# Patient Record
Sex: Male | Born: 1960
Health system: Southern US, Community
[De-identification: ages and names within clinical notes are randomized; demographics above are authoritative.]

## PROBLEM LIST (undated history)

## (undated) DIAGNOSIS — I10 Essential (primary) hypertension: Secondary | ICD-10-CM

## (undated) DIAGNOSIS — Z95810 Presence of automatic (implantable) cardiac defibrillator: Secondary | ICD-10-CM

## (undated) DIAGNOSIS — I429 Cardiomyopathy, unspecified: Secondary | ICD-10-CM

## (undated) DIAGNOSIS — F209 Schizophrenia, unspecified: Secondary | ICD-10-CM

## (undated) DIAGNOSIS — Z950 Presence of cardiac pacemaker: Secondary | ICD-10-CM

## (undated) DIAGNOSIS — I2510 Atherosclerotic heart disease of native coronary artery without angina pectoris: Secondary | ICD-10-CM

## (undated) DIAGNOSIS — G4730 Sleep apnea, unspecified: Secondary | ICD-10-CM

## (undated) DIAGNOSIS — F329 Major depressive disorder, single episode, unspecified: Secondary | ICD-10-CM

## (undated) DIAGNOSIS — K922 Gastrointestinal hemorrhage, unspecified: Secondary | ICD-10-CM

## (undated) DIAGNOSIS — F419 Anxiety disorder, unspecified: Secondary | ICD-10-CM

## (undated) DIAGNOSIS — I4891 Unspecified atrial fibrillation: Secondary | ICD-10-CM

## (undated) DIAGNOSIS — F32A Depression, unspecified: Secondary | ICD-10-CM

## (undated) HISTORY — DX: Schizophrenia, unspecified: F20.9

## (undated) HISTORY — PX: CORONARY STENT PLACEMENT: SHX1402

## (undated) HISTORY — DX: Anxiety disorder, unspecified: F41.9

## (undated) HISTORY — DX: Depression, unspecified: F32.A

## (undated) HISTORY — PX: HERNIA REPAIR: SHX51

## (undated) HISTORY — DX: Major depressive disorder, single episode, unspecified: F32.9

---

## 1999-12-05 ENCOUNTER — Emergency Department (HOSPITAL_COMMUNITY): Admission: EM | Admit: 1999-12-05 | Discharge: 1999-12-05 | Payer: Self-pay | Admitting: Emergency Medicine

## 1999-12-05 ENCOUNTER — Encounter: Payer: Self-pay | Admitting: Emergency Medicine

## 2007-04-17 ENCOUNTER — Encounter: Payer: Self-pay | Admitting: *Deleted

## 2010-06-10 ENCOUNTER — Emergency Department (HOSPITAL_COMMUNITY): Admission: EM | Admit: 2010-06-10 | Discharge: 2010-06-10 | Payer: Self-pay | Admitting: Emergency Medicine

## 2011-02-11 LAB — CBC
HCT: 43.1 % (ref 39.0–52.0)
Hemoglobin: 14.2 g/dL (ref 13.0–17.0)
MCH: 30.1 pg (ref 26.0–34.0)
MCHC: 33 g/dL (ref 30.0–36.0)
MCV: 91.2 fL (ref 78.0–100.0)
Platelets: 154 10*3/uL (ref 150–400)
RBC: 4.72 MIL/uL (ref 4.22–5.81)
RDW: 13.5 % (ref 11.5–15.5)
WBC: 5.7 10*3/uL (ref 4.0–10.5)

## 2011-02-11 LAB — URINALYSIS, ROUTINE W REFLEX MICROSCOPIC
Glucose, UA: NEGATIVE mg/dL
Hgb urine dipstick: NEGATIVE
Ketones, ur: 15 mg/dL — AB
Nitrite: NEGATIVE
Protein, ur: NEGATIVE mg/dL
Specific Gravity, Urine: 1.039 — ABNORMAL HIGH (ref 1.005–1.030)
Urobilinogen, UA: 1 mg/dL (ref 0.0–1.0)
pH: 5.5 (ref 5.0–8.0)

## 2011-02-11 LAB — BASIC METABOLIC PANEL
BUN: 13 mg/dL (ref 6–23)
CO2: 32 mEq/L (ref 19–32)
Calcium: 8.8 mg/dL (ref 8.4–10.5)
Chloride: 103 mEq/L (ref 96–112)
Creatinine, Ser: 0.88 mg/dL (ref 0.4–1.5)
GFR calc Af Amer: >60 mL/min (ref >60)
GFR calc non Af Amer: >60 mL/min (ref >60)
Glucose, Bld: 82 mg/dL (ref 70–99)
Potassium: 4.2 mEq/L (ref 3.5–5.1)
Sodium: 141 mEq/L (ref 135–145)

## 2011-02-11 LAB — GLUCOSE, CAPILLARY: Glucose-Capillary: 127 mg/dL — ABNORMAL HIGH (ref 70–99)

## 2011-02-11 LAB — DIFFERENTIAL
Basophils Absolute: 0 10*3/uL (ref 0.0–0.1)
Basophils Relative: 0 % (ref 0–1)
Eosinophils Absolute: 0.2 10*3/uL (ref 0.0–0.7)
Eosinophils Relative: 3 % (ref 0–5)
Lymphocytes Relative: 36 % (ref 12–46)
Lymphs Abs: 2 10*3/uL (ref 0.7–4.0)
Monocytes Absolute: 0.6 10*3/uL (ref 0.1–1.0)
Monocytes Relative: 10 % (ref 3–12)
Neutro Abs: 2.9 10*3/uL (ref 1.7–7.7)
Neutrophils Relative %: 50 % (ref 43–77)

## 2011-09-23 ENCOUNTER — Inpatient Hospital Stay (HOSPITAL_COMMUNITY)
Admission: EM | Admit: 2011-09-23 | Discharge: 2011-09-27 | DRG: 247 | Disposition: A | Discharge status: Home Health | Payer: Medicare Other | Attending: Cardiovascular Disease | Admitting: Cardiovascular Disease

## 2011-09-23 ENCOUNTER — Emergency Department (HOSPITAL_COMMUNITY): Payer: Medicare Other

## 2011-09-23 DIAGNOSIS — I214 Non-ST elevation (NSTEMI) myocardial infarction: Principal | ICD-10-CM | POA: Diagnosis present

## 2011-09-23 DIAGNOSIS — E119 Type 2 diabetes mellitus without complications: Secondary | ICD-10-CM | POA: Diagnosis present

## 2011-09-23 DIAGNOSIS — I2510 Atherosclerotic heart disease of native coronary artery without angina pectoris: Secondary | ICD-10-CM | POA: Diagnosis present

## 2011-09-23 DIAGNOSIS — F17200 Nicotine dependence, unspecified, uncomplicated: Secondary | ICD-10-CM | POA: Diagnosis present

## 2011-09-23 DIAGNOSIS — Z23 Encounter for immunization: Secondary | ICD-10-CM

## 2011-09-23 DIAGNOSIS — F319 Bipolar disorder, unspecified: Secondary | ICD-10-CM | POA: Diagnosis present

## 2011-09-23 DIAGNOSIS — F209 Schizophrenia, unspecified: Secondary | ICD-10-CM | POA: Diagnosis present

## 2011-09-23 DIAGNOSIS — Z79899 Other long term (current) drug therapy: Secondary | ICD-10-CM

## 2011-09-23 DIAGNOSIS — I10 Essential (primary) hypertension: Secondary | ICD-10-CM | POA: Diagnosis present

## 2011-09-23 LAB — COMPREHENSIVE METABOLIC PANEL
ALT: 74 U/L — ABNORMAL HIGH (ref 0–53)
AST: 48 U/L — ABNORMAL HIGH (ref 0–37)
Alkaline Phosphatase: 87 U/L (ref 39–117)
CO2: 31 mEq/L (ref 19–32)
Chloride: 96 mEq/L (ref 96–112)
GFR calc Af Amer: >90 mL/min (ref >90)
GFR calc non Af Amer: >90 mL/min (ref >90)
Glucose, Bld: 437 mg/dL — ABNORMAL HIGH (ref 70–99)
Potassium: 4.1 mEq/L (ref 3.5–5.1)
Sodium: 134 mEq/L — ABNORMAL LOW (ref 135–145)
Total Bilirubin: 0.3 mg/dL (ref 0.3–1.2)

## 2011-09-23 LAB — CBC
HCT: 39.8 % (ref 39.0–52.0)
MCH: 29 pg (ref 26.0–34.0)
MCV: 86.1 fL (ref 78.0–100.0)
Platelets: 191 10*3/uL (ref 150–400)
RBC: 4.62 MIL/uL (ref 4.22–5.81)
WBC: 7 10*3/uL (ref 4.0–10.5)

## 2011-09-23 LAB — GLUCOSE, CAPILLARY: Glucose-Capillary: 248 mg/dL — ABNORMAL HIGH (ref 70–99)

## 2011-09-23 LAB — POCT I-STAT TROPONIN I

## 2011-09-23 LAB — CK TOTAL AND CKMB (NOT AT ARMC)
CK, MB: 5.8 ng/mL — ABNORMAL HIGH (ref 0.3–4.0)
Total CK: 336 U/L — ABNORMAL HIGH (ref 7–232)

## 2011-09-23 LAB — APTT: aPTT: 29 seconds (ref 24–37)

## 2011-09-23 LAB — TYPE AND SCREEN

## 2011-09-23 LAB — ABO/RH: ABO/RH(D): B POS

## 2011-09-24 LAB — LIPID PANEL
HDL: 26 mg/dL — ABNORMAL LOW (ref >39)
LDL Cholesterol: 73 mg/dL (ref 0–99)
Total CHOL/HDL Ratio: 5.2 RATIO
Triglycerides: 174 mg/dL — ABNORMAL HIGH (ref <150)

## 2011-09-24 LAB — GLUCOSE, CAPILLARY
Glucose-Capillary: 229 mg/dL — ABNORMAL HIGH (ref 70–99)
Glucose-Capillary: 134 mg/dL — ABNORMAL HIGH (ref 70–99)
Glucose-Capillary: 186 mg/dL — ABNORMAL HIGH (ref 70–99)

## 2011-09-24 LAB — CARDIAC PANEL(CRET KIN+CKTOT+MB+TROPI)
CK, MB: 4.5 ng/mL — ABNORMAL HIGH (ref 0.3–4.0)
Relative Index: 1.9 (ref 0.0–2.5)
Relative Index: 1.8 (ref 0.0–2.5)
Total CK: 247 U/L — ABNORMAL HIGH (ref 7–232)

## 2011-09-24 LAB — HEPARIN LEVEL (UNFRACTIONATED)
Heparin Unfractionated: 0.1 IU/mL — ABNORMAL LOW (ref 0.30–0.70)
Heparin Unfractionated: 0.24 IU/mL — ABNORMAL LOW (ref 0.30–0.70)
Heparin Unfractionated: 0.34 IU/mL (ref 0.30–0.70)

## 2011-09-25 LAB — CBC
HCT: 40.2 % (ref 39.0–52.0)
HCT: 35.7 % — ABNORMAL LOW (ref 39.0–52.0)
Hemoglobin: 13.6 g/dL (ref 13.0–17.0)
Hemoglobin: 12 g/dL — ABNORMAL LOW (ref 13.0–17.0)
MCHC: 33.8 g/dL (ref 30.0–36.0)
MCV: 85.9 fL (ref 78.0–100.0)
MCV: 86.2 fL (ref 78.0–100.0)
WBC: 7.6 10*3/uL (ref 4.0–10.5)
WBC: 11.2 10*3/uL — ABNORMAL HIGH (ref 4.0–10.5)

## 2011-09-25 LAB — BASIC METABOLIC PANEL
BUN: 12 mg/dL (ref 6–23)
CO2: 29 mEq/L (ref 19–32)
Glucose, Bld: 145 mg/dL — ABNORMAL HIGH (ref 70–99)
Potassium: 3.7 mEq/L (ref 3.5–5.1)
Sodium: 138 mEq/L (ref 135–145)

## 2011-09-25 LAB — GLUCOSE, CAPILLARY
Glucose-Capillary: 113 mg/dL — ABNORMAL HIGH (ref 70–99)
Glucose-Capillary: 205 mg/dL — ABNORMAL HIGH (ref 70–99)

## 2011-09-26 LAB — BASIC METABOLIC PANEL
BUN: 15 mg/dL (ref 6–23)
CO2: 29 mEq/L (ref 19–32)
Chloride: 101 mEq/L (ref 96–112)
GFR calc Af Amer: >90 mL/min (ref >90)
Glucose, Bld: 227 mg/dL — ABNORMAL HIGH (ref 70–99)
Potassium: 3.8 mEq/L (ref 3.5–5.1)

## 2011-09-26 LAB — CBC
HCT: 32 % — ABNORMAL LOW (ref 39.0–52.0)
Hemoglobin: 10.5 g/dL — ABNORMAL LOW (ref 13.0–17.0)
MCV: 87 fL (ref 78.0–100.0)
RBC: 3.68 MIL/uL — ABNORMAL LOW (ref 4.22–5.81)
WBC: 9.1 10*3/uL (ref 4.0–10.5)

## 2011-09-26 LAB — GLUCOSE, CAPILLARY: Glucose-Capillary: 204 mg/dL — ABNORMAL HIGH (ref 70–99)

## 2011-09-27 LAB — GLUCOSE, CAPILLARY: Glucose-Capillary: 154 mg/dL — ABNORMAL HIGH (ref 70–99)

## 2011-09-27 LAB — CBC
HCT: 28.9 % — ABNORMAL LOW (ref 39.0–52.0)
RBC: 3.29 MIL/uL — ABNORMAL LOW (ref 4.22–5.81)
RDW: 12.9 % (ref 11.5–15.5)
WBC: 6.4 10*3/uL (ref 4.0–10.5)

## 2011-09-28 NOTE — Cardiovascular Report (Signed)
  NAMEOLLIVER, BOYADJIAN          ACCOUNT NO.:  [id number]  MEDICAL RECORD NO.:  [id number]  LOCATION:  2504                         FACILITY:  [facility]  PHYSICIAN:  Eduardo Osier. Sharyn Lull, M.D. DATE OF BIRTH:  [birth year]  DATE OF PROCEDURE:  09/25/2011 DATE OF DISCHARGE:                           CARDIAC CATHETERIZATION   PROCEDURE: 1. Successful percutaneous transluminal coronary angioplasty to mid     obtuse marginal 3 using 2.5 x 12 mm long Mini trek balloon 2. Successful deployment of 2.5 x 16 mm long PROMUS element Plus drug-     eluting stent in mid obtuse marginal 2.  INDICATION FOR THE PROCEDURE:  [name] is 50 year old black male with past medical history significant for schizophrenia, tobacco abuse, new onset diabetes mellitus, morbid obesity, was admitted by Dr. Algie Coffer on October 27 because of recurrent chest pain radiating to the right side associated with shortness of breath and diaphoresis.  The patient ruled in forsmall non-Q-wave myocardial infarction due to elevated cardiac enzymes and minor ST-T wave changes in the EKG.  The patient subsequently underwent left cardiac cath today by Dr. Algie Coffer as per his procedure report and was noted to have critical OM 3 stenosis with haziness which was felt to be the culprit lesion for his very small non-Q-wave myocardial infarction.  I was called for PCI to OM 3.  BRIEF OPERATIVE REPORT:  After getting the informed consent, a 5-French sheath was switched by Dr. Algie Coffer to 6-French arterial sheath.  Next, a 6-French 3.5 XP LAD guiding catheter was advanced over the wire under fluoroscopic guidance up to the ascending aorta.  Wire was pulled out. The catheter was aspirated and connected to the Manifold.  Catheter was further advanced and engaged into the left coronary ostium.  A single view of right and left coronary artery was obtained.  FINDINGS:  As above.  INTERVENTIONAL PROCEDURE:  Successful PTCA to mid OM 3 was done  using 2.5 x 12 mm long mini trek balloon for predilatation and then 2.5 x 16 mm long PROMUS element Plus drug-eluting stent was deployed at 11 atmospheric pressure, stent was post dilated using the same balloon going up to 15 atmospheric pressure.  Lesion was dilated from 70-75% to 0% residual with excellent TIMI grade 3 distal flow without evidence of dissection or distal embolization.  The patient received weight based Angiomax and 60 mg of Effient during the procedure.  The patient tolerated the procedure well.  There were no complications.  The patient was transferred to recovery room in stable condition.    Eduardo Osier. Sharyn Lull, M.D.    MNH/MEDQ  D:  09/25/2011  T:  09/26/2011  Job:  [id number]  Electronically Signed by Rinaldo Cloud M.D. on 09/28/2011 09:16:20 AM

## 2011-11-09 ENCOUNTER — Encounter: Payer: Self-pay | Admitting: *Deleted

## 2011-11-09 ENCOUNTER — Emergency Department (INDEPENDENT_AMBULATORY_CARE_PROVIDER_SITE_OTHER)
Admission: EM | Admit: 2011-11-09 | Discharge: 2011-11-09 | Disposition: A | Discharge status: Home / Self Care | Payer: Self-pay | Source: Home / Self Care | Attending: Family Medicine | Admitting: Family Medicine

## 2011-11-09 DIAGNOSIS — L049 Acute lymphadenitis, unspecified: Secondary | ICD-10-CM

## 2011-11-09 HISTORY — DX: Atherosclerotic heart disease of native coronary artery without angina pectoris: I25.10

## 2011-11-09 MED ORDER — DICLOFENAC POTASSIUM 50 MG PO TABS: 50.0000 mg | ORAL_TABLET | Freq: Three times a day (TID) | ORAL | Status: AC

## 2011-11-09 MED ORDER — AMOXICILLIN-POT CLAVULANATE 875-125 MG PO TABS: 1.0000 | ORAL_TABLET | Freq: Two times a day (BID) | ORAL | Status: AC

## 2011-11-09 NOTE — ED Notes (Signed)
Pt  Has  Swollen  Tender  Area  Around  l    Side  Neck    In parotid  Area       X 1  Week  Worse  Today  Tender  To  Touch   Pt  Is  In no  resp  Distress    Good  Airway  Exchange  Speaking in complete  sentances

## 2011-11-09 NOTE — ED Provider Notes (Signed)
History     CSN: [id number] Arrival date & time: 11/09/2011  5:23 PM   First MD Initiated Contact with Patient 11/09/11 1504      Chief Complaint  Patient presents with  . Facial Swelling    (Consider location/radiation/quality/duration/timing/severity/associated sxs/prior treatment) Patient is a 50 y.o. male presenting with ear pain.  Otalgia This is a new problem. The current episode started more than 1 week ago. There is pain in the left ear. The problem occurs constantly. The problem has been gradually worsening. There has been no fever. The pain is moderate. Associated symptoms include headaches and neck pain.    Past Medical History  Diagnosis Date  . Diabetes mellitus   . CAD (coronary artery disease)     Past Surgical History  Procedure Date  . Coronary stent placement     Family History  Problem Relation Age of Onset  . Heart failure Mother     History  Substance Use Topics  . Smoking status: Never Smoker   . Smokeless tobacco: Not on file  . Alcohol Use: No      Review of Systems  Constitutional: Negative.   HENT: Positive for ear pain and neck pain.   Neurological: Positive for headaches.    Allergies  Review of patient's allergies indicates no known allergies.  Home Medications   Current Outpatient Rx  Name Route Sig Dispense Refill  . INSULIN ASPART 100 UNIT/ML [place] SOLN Subcutaneous Inject into the skin 3 (three) times daily before meals.      . AMOXICILLIN-POT CLAVULANATE 875-125 MG PO TABS Oral Take 1 tablet by mouth 2 (two) times daily. 20 tablet 0  . DICLOFENAC POTASSIUM 50 MG PO TABS Oral Take 1 tablet (50 mg total) by mouth 3 (three) times daily. For pain 30 tablet 0    BP 185/100  Pulse 77  Temp(Src) 98.6 F (37 C) (Oral)  Resp 18  SpO2 97%  Physical Exam  Nursing note and vitals reviewed. Constitutional: He appears well-developed and well-nourished.  HENT:  Head: Normocephalic.  Right Ear: External ear normal.  Left Ear:  External ear normal.  Mouth/Throat: Oropharynx is clear and moist.  Eyes: Pupils are equal, round, and reactive to light.  Neck: Normal range of motion. Neck supple.      ED Course  Procedures (including critical care time)  Labs Reviewed - No data to display No results found.   1. Acute lymphadenitis       MDM          Barkley Bruns, MD 11/09/11 [phone]

## 2011-11-10 NOTE — Discharge Summary (Signed)
[name], [name]          ACCOUNT NO.:  [id number]  MEDICAL RECORD NO.:  [id number]  LOCATION:  2504                         FACILITY:  [facility]  PHYSICIAN:  Ricki Rodriguez, M.D.  DATE OF BIRTH:  [birth year]  DATE OF ADMISSION:  09/23/2011 DATE OF DISCHARGE:  09/27/2011                              DISCHARGE SUMMARY   FINAL DIAGNOSES: 1. Non ST-elevation myocardial infarction, acute. 2. Native-vessel multivessel coronary artery disease. 3. Stent placement in obtuse marginal branch 3. 4. New-onset diabetes mellitus. 5. Obesity.  DISCHARGE MEDICATIONS: 1. Aspirin 325 mg 1 daily, glucometer use as directed.  Glucometer     strips with alcohol swabs and lancet use as directed. 2. Insulin, Lantus 15 units at bedtime. 3. Metoprolol tartrate 25 mg twice daily. 4. Nicotine 21 mg for 24 hour patch, apply daily. 5. Nitroglycerin 0.4 mg 1 tablet 1 sublingual every 5 minutes x3 as     needed for chest pain. 6. Prasugrel 10 mg daily. 7. Crestor 10 mg at bedtime. 8. Seroquel 200 mg two and half tablets at bedtime.  DISCHARGE DIET:  Low-sodium, heart-healthy diet.  DISCHARGE ACTIVITY:  The patient increased activity gradually as tolerated.  FOLLOWUP:  Follow up by Dr. Algie Coffer in 1 week.  The patient to call [phone] for appointment.  WOUND CARE INSTRUCTIONS:  The patient to notify right groin pain, swelling, or discharge.  HISTORY:  This 50 year old black male presented with a 2-week history of recurrent chest pain, no prior history of coronary artery disease.  The patient had some shortness of breath and sweating spell, and the chest pain was radiating to his right side and causing pressure-type effect.  The patient did not know he had diabetes.  He has a history of smoking half a pack of cigarettes per day for 34 years.  PHYSICAL EXAMINATION:  VITAL SIGNS:  Temperature 97.8, pulse 90, respirations 20, blood pressure 155/91. GENERAL:  The patient is well built, well  nourished, and appears somewhat in distress. HEENT:  The patient is normocephalic, atraumatic with brown eyes. Conjunctivae pink.  Sclerae white. NECK:  No JVD. LUNGS:  Clear bilaterally. HEART:  Normal S1, S2, without S3 gallop. ABDOMEN:  Soft. EXTREMITIES:  No edema, cyanosis, or clubbing. SKIN:  Warm and dry. NEUROLOGIC:  Neurologically, the patient moves all 4 extremities.  LABORATORY DATA:  Normal hemoglobin, hematocrit, WBC count, platelet count.  Near normal electrolytes, BUN, creatinine.  Sugar elevated at 437.  Subsequent, sugar was down to 227.  Liver enzymes showed slightly elevated ALT, AST and borderline low albumin level.  CK was elevated at 336 with an MB of 5.8.  Troponin-I of 0.59.  Subsequent, CK was 314 with an MB of 6, and troponin-I of 1.01.  Cholesterol level was normal at 134, triglyceride was slightly high at 174 mg/dL, HDL-cholesterol was low at 26, and LDL-cholesterol was normal at 73 mg/dL.  EKG showed sinus rhythm with a septal infarct.  Chest x-ray showed no evidence of acute cardiopulmonary disease.  Cardiac catheterizations showed significant OM 3 stenosis.  HOSPITAL COURSE:  The patient was admitted to telemetry unit.  He ruled in for non-ST elevation myocardial infarction hence he underwent cardiac catheterizations that showed significant obtuse marginal  branch 3 stenosis, and Dr. Rinaldo Cloud was called for stent placement, 2.5 x 12- mm long Mini-Trek balloon followed by 2.5 x 16-mm long Promus Element Plus drug-eluting stent was placed in mid obtuse marginal branch 2.  The patient had 70-75% stenosis reduced to 0% with excellent TIMI grade 3 flow.  Postprocedure, the patient had no major events.  His blood sugar was controlled with the use of Lantus and he had diabetic teaching and on September 27, 2011, he was discharged home in satisfactory condition with a follow up by me in 2 weeks.     Ricki Rodriguez, M.D.     ASK/MEDQ  D:   11/09/2011  T:  11/10/2011  Job:  [id number]

## 2012-12-17 ENCOUNTER — Encounter (HOSPITAL_BASED_OUTPATIENT_CLINIC_OR_DEPARTMENT_OTHER)
Admission: RE | Disposition: A | Discharge status: Home / Self Care | Payer: Self-pay | Source: Ambulatory Visit | Attending: Cardiology

## 2012-12-17 ENCOUNTER — Inpatient Hospital Stay (HOSPITAL_BASED_OUTPATIENT_CLINIC_OR_DEPARTMENT_OTHER)
Admission: RE | Admit: 2012-12-17 | Discharge: 2012-12-17 | Disposition: A | Discharge status: Home / Self Care | Payer: Medicare HMO | Source: Ambulatory Visit | Attending: Cardiology | Admitting: Cardiology

## 2012-12-17 DIAGNOSIS — I252 Old myocardial infarction: Secondary | ICD-10-CM | POA: Insufficient documentation

## 2012-12-17 DIAGNOSIS — I10 Essential (primary) hypertension: Secondary | ICD-10-CM | POA: Insufficient documentation

## 2012-12-17 DIAGNOSIS — Z9861 Coronary angioplasty status: Secondary | ICD-10-CM | POA: Insufficient documentation

## 2012-12-17 DIAGNOSIS — E119 Type 2 diabetes mellitus without complications: Secondary | ICD-10-CM | POA: Insufficient documentation

## 2012-12-17 DIAGNOSIS — E7800 Pure hypercholesterolemia, unspecified: Secondary | ICD-10-CM | POA: Insufficient documentation

## 2012-12-17 DIAGNOSIS — R079 Chest pain, unspecified: Secondary | ICD-10-CM | POA: Insufficient documentation

## 2012-12-17 DIAGNOSIS — Z8249 Family history of ischemic heart disease and other diseases of the circulatory system: Secondary | ICD-10-CM | POA: Insufficient documentation

## 2012-12-17 DIAGNOSIS — F259 Schizoaffective disorder, unspecified: Secondary | ICD-10-CM | POA: Insufficient documentation

## 2012-12-17 DIAGNOSIS — I2510 Atherosclerotic heart disease of native coronary artery without angina pectoris: Secondary | ICD-10-CM | POA: Insufficient documentation

## 2012-12-17 LAB — POCT I-STAT GLUCOSE
Glucose, Bld: 193 mg/dL — ABNORMAL HIGH (ref 70–99)
Operator id: 141321

## 2012-12-17 SURGERY — JV LEFT HEART CATHETERIZATION WITH CORONARY ANGIOGRAM
Anesthesia: Moderate Sedation

## 2012-12-17 MED ORDER — ASPIRIN 81 MG PO CHEW
324.0000 mg | CHEWABLE_TABLET | ORAL | Status: AC
  Administered 2012-12-17: 324 mg via ORAL

## 2012-12-17 MED ORDER — ONDANSETRON HCL 4 MG/2ML IJ SOLN: 4.0000 mg | Freq: Four times a day (QID) | INTRAMUSCULAR | Status: DC | PRN

## 2012-12-17 MED ORDER — ACETAMINOPHEN 325 MG PO TABS: 650.0000 mg | ORAL_TABLET | ORAL | Status: DC | PRN

## 2012-12-17 MED ORDER — DIAZEPAM 5 MG PO TABS
5.0000 mg | ORAL_TABLET | ORAL | Status: AC
  Administered 2012-12-17: 5 mg via ORAL

## 2012-12-17 MED ORDER — NITROGLYCERIN 0.4 MG/SPRAY TL SOLN
1.0000 | Freq: Once | Status: AC
  Administered 2012-12-17: 1 via SUBLINGUAL

## 2012-12-17 MED ORDER — OXYCODONE-ACETAMINOPHEN 5-325 MG PO TABS: 1.0000 | ORAL_TABLET | ORAL | Status: DC | PRN

## 2012-12-17 MED ORDER — SODIUM CHLORIDE 0.9 % IJ SOLN: 3.0000 mL | INTRAMUSCULAR | Status: DC | PRN

## 2012-12-17 MED ORDER — SODIUM CHLORIDE 0.9 % IV SOLN: INTRAVENOUS | Status: DC

## 2012-12-17 MED ORDER — SODIUM CHLORIDE 0.9 % IJ SOLN: 3.0000 mL | Freq: Two times a day (BID) | INTRAMUSCULAR | Status: DC

## 2012-12-17 MED ORDER — SODIUM CHLORIDE 0.9 % IV SOLN: 250.0000 mL | INTRAVENOUS | Status: DC | PRN

## 2012-12-17 NOTE — CV Procedure (Signed)
(  Cath report dictated on 12/17/2012 dictation number is [id number]

## 2012-12-17 NOTE — Cardiovascular Report (Signed)
NAME:  [name], [name]          ACCOUNT NO.:  [id number]  MEDICAL RECORD NO.:  [id number]  LOCATION:                                 FACILITY:  PHYSICIAN:  Elienai Gailey N. Sharyn Lull, M.D.      DATE OF BIRTH:  DATE OF PROCEDURE:  12/17/2012 DATE OF DISCHARGE:                           CARDIAC CATHETERIZATION   PROCEDURES: 1. Left cardiac cath with selective left and right coronary     angiography. 2. Left ventriculography via right groin using Judkins technique.  INDICATION FOR THE PROCEDURE:  [name] is a 52 year old male with past medical history significant for coronary artery disease, history of non-Q-wave myocardial infarction in October 2012, status post PTCA stenting to OM-2 in October 2012, hypertension, diabetes mellitus, hypercholesteremia, morbid obesity, complains of retrosternal exertional chest pain associated with nausea and shortness of breath.  Denies any palpitation, lightheadedness, or syncope.  He states chest pain occasionally radiates to the neck and feels similar in nature when he had MI.  Denies any PND, orthopnea, or leg swelling.  Denies relation of chest pain to food, breathing, or movement.  Due to typical anginal chest pain, multiple risk factors, discussed with the patient regarding noninvasive stress testing versus left cath, possible PTCA stenting, its risks and benefits, i.e., death, MI, stroke, need for emergency CABG, local vascular complications, etc., and consented for PCI.  DESCRIPTION OF PROCEDURE:  After obtaining the informed consent, the patient was brought to the cath lab and was placed on fluoroscopy table. Right groin was prepped and draped in usual fashion.  A 1% Xylocaine was used for local anesthesia in the right groin.  With the help of single needle, 4-French arterial sheath was placed.  The sheath was aspirated and flushed.  Next, 4-French left Judkins catheter was advanced over the wire under fluoroscopic guidance up  to the ascending aorta.  Wire was pulled out.  The catheter was aspirated and connected to the Manifold. Catheter was further advanced and engaged into left coronary ostium. Multiple views of the left system were taken.  Next, catheter was disengaged and was pulled out over the wire and was replaced with 4- [place] 3D right diagnostic catheter which was advanced over the wire under fluoroscopic guidance up to the ascending aorta.  Wire was pulled out.  The catheter was aspirated and connected to the Manifold. Catheter was further advanced and engaged into right coronary ostium. Multiple views of the right system were taken.  Next, the catheter was disengaged and was pulled out over the wire and was replaced with 4- [place] pigtail catheter which was advanced over the wire under fluoroscopic guidance up to the ascending aorta.  Catheter was further advanced across the aortic valve into the LV.  LV pressures were recorded.  Next, LV graft was done in 30-degree RAO position.  Post- angiographic pressures were recorded from LV and then pullback pressures were recorded from the aorta.  There was no gradient across the aortic valve.  Next, the pigtail catheter was pulled out over the wire. Sheaths were aspirated and flushed.  FINDINGS:  LV was mildly enlarged.  There was moderate LVH.  EF of 50%- 55%.  Left main was patent.  LAD has  20%-25% proximal and mid stenosis. Diagonal 1 and 2 were small which were patent.  Ramus has 15%-20% proximal stenosis.  Left circumflex has 60%-70% proximal stenosis.  OM-1 is very, very small.  OM-2 stented segment is patent.  RCA has 40%-50% proximal stenosis and 20%-25% mid stenosis.  PDA and PLV branches were very small.  The patient tolerated the procedure well.  There were no complications.  The patient was transferred to recovery room in stable condition.  Plan is to maximize antianginal medications.  If he gets recurrent chest pain, we will consider PCI to  proximal left circumflex.     Eduardo Osier. Sharyn Lull, M.D.     MNH/MEDQ  D:  12/17/2012  T:  12/17/2012  Job:  [id number]

## 2012-12-17 NOTE — OR Nursing (Signed)
Dr Harwani at bedside to discuss results and treatment plan with pt and family 

## 2012-12-17 NOTE — H&P (Signed)
  Handwritten H&P in the chart needs to be scanned. 

## 2012-12-17 NOTE — OR Nursing (Signed)
Tegaderm dressing applied, site level 0, bedrest begins at 0830

## 2012-12-17 NOTE — OR Nursing (Signed)
Discharge instructions reviewed and signed, pt stated understanding, ambulated in hall without difficulty, site level 0, transported to friend's car via wheelchair 

## 2012-12-17 NOTE — OR Nursing (Signed)
Meal served 

## 2013-02-20 ENCOUNTER — Other Ambulatory Visit (HOSPITAL_COMMUNITY): Payer: Self-pay | Admitting: Otolaryngology

## 2013-02-20 DIAGNOSIS — R220 Localized swelling, mass and lump, head: Secondary | ICD-10-CM

## 2013-02-20 DIAGNOSIS — K118 Other diseases of salivary glands: Secondary | ICD-10-CM

## 2013-02-25 ENCOUNTER — Ambulatory Visit (HOSPITAL_COMMUNITY)
Admission: RE | Admit: 2013-02-25 | Discharge: 2013-02-25 | Disposition: A | Discharge status: Home / Self Care | Payer: Medicare HMO | Source: Ambulatory Visit | Attending: Otolaryngology | Admitting: Otolaryngology

## 2013-02-25 DIAGNOSIS — K118 Other diseases of salivary glands: Secondary | ICD-10-CM

## 2013-02-25 DIAGNOSIS — H938X9 Other specified disorders of ear, unspecified ear: Secondary | ICD-10-CM | POA: Insufficient documentation

## 2013-02-25 DIAGNOSIS — R220 Localized swelling, mass and lump, head: Secondary | ICD-10-CM | POA: Insufficient documentation

## 2013-02-25 DIAGNOSIS — R221 Localized swelling, mass and lump, neck: Secondary | ICD-10-CM

## 2013-02-28 ENCOUNTER — Other Ambulatory Visit (HOSPITAL_COMMUNITY): Payer: Self-pay | Admitting: Otolaryngology

## 2013-02-28 ENCOUNTER — Other Ambulatory Visit: Payer: Self-pay | Admitting: Radiology

## 2013-02-28 DIAGNOSIS — R221 Localized swelling, mass and lump, neck: Secondary | ICD-10-CM

## 2013-03-03 ENCOUNTER — Ambulatory Visit (HOSPITAL_COMMUNITY)
Admission: RE | Admit: 2013-03-03 | Discharge: 2013-03-03 | Disposition: A | Discharge status: Home / Self Care | Payer: Medicare HMO | Source: Ambulatory Visit | Attending: Otolaryngology | Admitting: Otolaryngology

## 2013-03-03 ENCOUNTER — Encounter (HOSPITAL_COMMUNITY): Payer: Self-pay

## 2013-03-03 DIAGNOSIS — R220 Localized swelling, mass and lump, head: Secondary | ICD-10-CM | POA: Insufficient documentation

## 2013-03-03 DIAGNOSIS — I2510 Atherosclerotic heart disease of native coronary artery without angina pectoris: Secondary | ICD-10-CM | POA: Insufficient documentation

## 2013-03-03 DIAGNOSIS — R221 Localized swelling, mass and lump, neck: Secondary | ICD-10-CM

## 2013-03-03 DIAGNOSIS — M542 Cervicalgia: Secondary | ICD-10-CM | POA: Insufficient documentation

## 2013-03-03 DIAGNOSIS — E119 Type 2 diabetes mellitus without complications: Secondary | ICD-10-CM | POA: Insufficient documentation

## 2013-03-03 NOTE — H&P (Signed)
[name] is an 52 y.o. male.   Chief Complaint: lt sided facial peri aricular swelling x 1 yr Recently enlarging Has become painful Scheduled for biopsy now HPI: DM; CAD  Past Medical History  Diagnosis Date  . Diabetes mellitus   . CAD (coronary artery disease)     Past Surgical History  Procedure Laterality Date  . Coronary stent placement      Family History  Problem Relation Age of Onset  . Heart failure Mother    Social History:  reports that he has never smoked. He does not have any smokeless tobacco history on file. He reports that he does not drink alcohol or use illicit drugs.  Allergies: No Known Allergies   (Not in a hospital admission)  No results found for this or any previous visit (from the past 48 hour(s)). No results found.  Review of Systems  Constitutional: Negative for fever.  HENT: Positive for neck pain.   Gastrointestinal: Negative for nausea, vomiting and abdominal pain.  Musculoskeletal:       Lt sided auricular pain  Neurological: Negative for weakness and headaches.    There were no vitals taken for this visit. Physical Exam   Assessment/Plan Left periauricular swelling Scheduled for bx now Pt aware of procedure benefits and risks and agreeable to proceed Consent signed and in chart  Raynelle Fujikawa A 03/03/2013, 1:41 PM

## 2013-03-03 NOTE — Procedures (Signed)
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  No immediate complications.

## 2013-04-02 ENCOUNTER — Other Ambulatory Visit: Payer: Self-pay | Admitting: Otolaryngology

## 2013-07-28 ENCOUNTER — Encounter (HOSPITAL_COMMUNITY): Payer: Self-pay | Admitting: *Deleted

## 2013-07-28 ENCOUNTER — Emergency Department (HOSPITAL_COMMUNITY)
Admission: EM | Admit: 2013-07-28 | Discharge: 2013-07-28 | Disposition: A | Discharge status: Home / Self Care | Payer: Medicare HMO | Attending: Emergency Medicine | Admitting: Emergency Medicine

## 2013-07-28 DIAGNOSIS — M545 Low back pain, unspecified: Secondary | ICD-10-CM | POA: Insufficient documentation

## 2013-07-28 DIAGNOSIS — R350 Frequency of micturition: Secondary | ICD-10-CM | POA: Insufficient documentation

## 2013-07-28 DIAGNOSIS — Z794 Long term (current) use of insulin: Secondary | ICD-10-CM | POA: Insufficient documentation

## 2013-07-28 DIAGNOSIS — IMO0002 Reserved for concepts with insufficient information to code with codable children: Secondary | ICD-10-CM | POA: Insufficient documentation

## 2013-07-28 DIAGNOSIS — I2510 Atherosclerotic heart disease of native coronary artery without angina pectoris: Secondary | ICD-10-CM | POA: Insufficient documentation

## 2013-07-28 DIAGNOSIS — I10 Essential (primary) hypertension: Secondary | ICD-10-CM | POA: Insufficient documentation

## 2013-07-28 DIAGNOSIS — E119 Type 2 diabetes mellitus without complications: Secondary | ICD-10-CM | POA: Insufficient documentation

## 2013-07-28 DIAGNOSIS — M5432 Sciatica, left side: Secondary | ICD-10-CM

## 2013-07-28 DIAGNOSIS — G8929 Other chronic pain: Secondary | ICD-10-CM | POA: Insufficient documentation

## 2013-07-28 DIAGNOSIS — R300 Dysuria: Secondary | ICD-10-CM | POA: Insufficient documentation

## 2013-07-28 HISTORY — DX: Essential (primary) hypertension: I10

## 2013-07-28 MED ORDER — METHOCARBAMOL 500 MG PO TABS: 500.0000 mg | ORAL_TABLET | Freq: Two times a day (BID) | ORAL | Status: DC

## 2013-07-28 MED ORDER — OXYCODONE-ACETAMINOPHEN 5-325 MG PO TABS: 2.0000 | ORAL_TABLET | ORAL | Status: DC | PRN

## 2013-07-28 NOTE — ED Provider Notes (Signed)
Medical screening examination/treatment/procedure(s) were performed by non-physician practitioner and as supervising physician I was immediately available for consultation/collaboration. Devoria Albe, MD, Armando Gang   Ward Givens, MD 07/28/13 [phone]

## 2013-07-28 NOTE — ED Provider Notes (Signed)
CSN: [id number]     Arrival date & time 07/28/13  1702 History  This chart was scribed for non-physician practitioner Fayrene Helper, PA-C,  working with Ward Givens, MD by Dorothey Baseman, ED Scribe. This patient was seen in room TR05C/TR05C and the patient's care was started at 5:27 PM.    Chief Complaint  Patient presents with  . Flank Pain    Patient is a 52 y.o. male presenting with flank pain. The history is provided by the patient. No language interpreter was used.  Flank Pain This is a chronic problem. The current episode started more than 2 days ago (3 days ago). The problem has been gradually worsening. Pertinent negatives include no chest pain and no shortness of breath. The symptoms are aggravated by exertion. The symptoms are relieved by medications (Ibuprofen and muscle relaxants). The treatment provided mild relief.   HPI Comments: KYVON HU is a 52 y.o. Male with a history of lower back pain who presents to the Emergency Department complaining of intermittent, left lower back pain worsening over the past 3 days that radiates down through his left leg and is exacerbated with movement. Patient has a history of lower back pain for the past several years onset following a previous fall and he reports that he was diagnosed with a pinched nerve at the time. He reports that he has taken 800 mg of Ibuprofen and muscle relaxants with mild, temporary relief. He denies any potential injury to the area. Patient reports urinary frequency with some associated dysuria, but no bladder or bowel incontinence. Urinary discomfort has resolved prior to back pain.  No hematuria.  Sts pain is similar to prior back pain he has many times in the past.  No hx of UTI.  He denies chest pain, shortness of breath, lightheadedness or dizziness, fever, or rash. Patient has a history of DM that is reasonably well-controlled.   Past Medical History  Diagnosis Date  . Diabetes mellitus   . CAD (coronary artery  disease)   . Hypertension    Past Surgical History  Procedure Laterality Date  . Coronary stent placement     Family History  Problem Relation Age of Onset  . Heart failure Mother    History  Substance Use Topics  . Smoking status: Never Smoker   . Smokeless tobacco: Not on file  . Alcohol Use: No    Review of Systems  Constitutional: Negative for fever.  Respiratory: Negative for shortness of breath.   Cardiovascular: Negative for chest pain.  Genitourinary: Positive for dysuria, frequency and flank pain.  Skin: Negative for rash.  Neurological: Negative for dizziness and light-headedness.    Allergies  Review of patient's allergies indicates no known allergies.  Home Medications   Current Outpatient Rx  Name  Route  Sig  Dispense  Refill  . insulin aspart (NOVOLOG) 100 UNIT/ML injection   Subcutaneous   Inject into the skin 3 (three) times daily before meals.            Triage Vitals: BP 138/91  Pulse 85  Temp(Src) 97.9 F (36.6 C) (Oral)  Resp 18  Ht 6\' 2"  (1.88 m)  Wt 294 lb (133.358 kg)  BMI 37.73 kg/m2  SpO2 97%  Physical Exam  Nursing note and vitals reviewed. Constitutional: He is oriented to person, place, and time. He appears well-developed and well-nourished. No distress.  HENT:  Head: Normocephalic and atraumatic.  Eyes: Conjunctivae are normal.  Neck: Normal range of motion. Neck  supple.  Genitourinary:  No CVA tenderness  Musculoskeletal: Normal range of motion.  Tenderness to palpation to the left para-lumbar region. Negative straight-leg raise.  Neurological: He is alert and oriented to person, place, and time. He has normal reflexes.  Patient walks with a mild limp.   Skin: Skin is warm and dry.  No overlaying skin changes.   Psychiatric: He has a normal mood and affect. His behavior is normal.    ED Course  Procedures (including critical care time)  DIAGNOSTIC STUDIES: Oxygen Saturation is 97% on room air, normal by my  interpretation.    COORDINATION OF CARE: 5:32PM- Pain is reproducible on exam, likely MSK.  Doubt UTI or kidney stone.  Will prescribe Percocet and Robaxin for pain management. Advised patient to continue using Ibuprofen and muscle relaxants. Advised patient to follow up with his PCP if there are any new or worsening symptoms. Discussed treatment plan with patient at bedside and patient verbalized agreement.     Labs Review Labs Reviewed - No data to display Imaging Review No results found.  MDM   1. Sciatica, left    BP 138/91  Pulse 85  Temp(Src) 97.9 F (36.6 C) (Oral)  Resp 18  Ht 6\' 2"  (1.88 m)  Wt 294 lb (133.358 kg)  BMI 37.73 kg/m2  SpO2 97%  I personally performed the services described in this documentation, which was scribed in my presence. The recorded information has been reviewed and is accurate.     Fayrene Helper, PA-C 07/28/13 [phone]

## 2013-07-28 NOTE — ED Notes (Signed)
Pt to ED c/o L flank pain for years that is getting worse.  States he has told his pcp about the pain.  Denies hematuria, nausea or vomiting.  He states that sometimes the pain radiates down L leg.

## 2013-08-21 ENCOUNTER — Ambulatory Visit (HOSPITAL_COMMUNITY): Payer: Medicare Other | Admitting: Psychiatry

## 2014-07-03 ENCOUNTER — Other Ambulatory Visit (HOSPITAL_COMMUNITY): Payer: Self-pay | Admitting: Cardiology

## 2014-07-03 DIAGNOSIS — R079 Chest pain, unspecified: Secondary | ICD-10-CM

## 2014-07-13 ENCOUNTER — Ambulatory Visit (HOSPITAL_COMMUNITY)
Admission: RE | Admit: 2014-07-13 | Discharge: 2014-07-13 | Disposition: A | Discharge status: Home / Self Care | Payer: Commercial Managed Care - HMO | Source: Ambulatory Visit | Attending: Interventional Radiology | Admitting: Interventional Radiology

## 2014-07-13 DIAGNOSIS — I252 Old myocardial infarction: Secondary | ICD-10-CM | POA: Diagnosis not present

## 2014-07-13 DIAGNOSIS — R079 Chest pain, unspecified: Secondary | ICD-10-CM

## 2014-07-13 MED ORDER — REGADENOSON 0.4 MG/5ML IV SOLN
0.4000 mg | Freq: Once | INTRAVENOUS | Status: AC
  Administered 2014-07-13: 0.4 mg via INTRAVENOUS

## 2014-07-13 MED ORDER — REGADENOSON 0.4 MG/5ML IV SOLN
INTRAVENOUS | Status: AC
  Administered 2014-07-13: 0.4 mg via INTRAVENOUS
  Filled 2014-07-13: qty 5

## 2014-07-14 ENCOUNTER — Ambulatory Visit (HOSPITAL_COMMUNITY)
Admission: RE | Admit: 2014-07-14 | Discharge: 2014-07-14 | Disposition: A | Discharge status: Home / Self Care | Payer: Medicare HMO | Source: Ambulatory Visit | Attending: Cardiology | Admitting: Cardiology

## 2014-07-14 DIAGNOSIS — R079 Chest pain, unspecified: Secondary | ICD-10-CM | POA: Insufficient documentation

## 2014-07-14 DIAGNOSIS — I252 Old myocardial infarction: Secondary | ICD-10-CM | POA: Diagnosis not present

## 2014-07-14 MED ORDER — TECHNETIUM TC 99M SESTAMIBI GENERIC - CARDIOLITE
30.0000 | Freq: Once | INTRAVENOUS | Status: AC | PRN
  Administered 2014-07-14: 30 via INTRAVENOUS

## 2014-07-28 ENCOUNTER — Ambulatory Visit: Payer: Self-pay | Admitting: Podiatry

## 2014-08-04 ENCOUNTER — Ambulatory Visit (INDEPENDENT_AMBULATORY_CARE_PROVIDER_SITE_OTHER): Payer: Commercial Managed Care - HMO | Admitting: Podiatry

## 2014-08-04 ENCOUNTER — Encounter: Payer: Self-pay | Admitting: Podiatry

## 2014-08-04 ENCOUNTER — Ambulatory Visit (INDEPENDENT_AMBULATORY_CARE_PROVIDER_SITE_OTHER): Payer: Commercial Managed Care - HMO

## 2014-08-04 VITALS — BP 148/94 | HR 67 | Resp 16

## 2014-08-04 DIAGNOSIS — M79609 Pain in unspecified limb: Secondary | ICD-10-CM

## 2014-08-04 DIAGNOSIS — M79672 Pain in left foot: Secondary | ICD-10-CM

## 2014-08-04 DIAGNOSIS — M722 Plantar fascial fibromatosis: Secondary | ICD-10-CM

## 2014-08-04 NOTE — Progress Notes (Signed)
   Subjective:    Patient ID: [name], male    DOB: July 08, 1961, 53 y.o.   MRN: [id number]  HPI Comments: "I have pain in the bottom"  Patient sharp, aching arch left for about 6 years. There is a large, soft knot. It has increased in size and just recently started to hurt more. Walking a lot aggravates. PCP Rx'd "diabetic nerve pill" for pain but doesn't help. Has been seen here in the past for same problem.  Foot Pain Associated symptoms include a sore throat.      Review of Systems  HENT: Positive for sore throat and tinnitus.   Gastrointestinal: Positive for abdominal distention.  Endocrine: Positive for cold intolerance, heat intolerance and polyuria.  Musculoskeletal: Positive for back pain.  Hematological: Bruises/bleeds easily.  All other systems reviewed and are negative.      Objective:   Physical Exam: I have reviewed his past medical history medications allergies surgeries social history and review of systems. Pulses are strongly palpable. Neurologic sensorium is intact per since once the monofilament. Deep tendon reflexes are intact bilateral muscle strength is 5 over 5 dorsiflexors plantar flexors inverters everters all intrinsic musculature is intact. Orthopedic evaluation demonstrates pes planus bilateral. Mild possible hammertoe deformities are noted bilateral. Cutaneous evaluation Mr. is supple well hydrated cutis with a nonpulsatile nodule leg mass to the plantar medial band of the plantar fascial left foot. Just proximal to the first metatarsophalangeal joint. Radiographic evaluation does not demonstrate any type of spiculation or ossification within this region does not appear to be indicative of carcinoma.        Assessment & Plan:  Assessment: Plantar fibromatosis left.  Plan: Injected this lesion with Kenalog and local anesthetic. Followup with him in 2 months.

## 2014-09-29 ENCOUNTER — Ambulatory Visit: Payer: Commercial Managed Care - HMO | Admitting: Podiatry

## 2014-10-08 ENCOUNTER — Ambulatory Visit (INDEPENDENT_AMBULATORY_CARE_PROVIDER_SITE_OTHER): Payer: Commercial Managed Care - HMO | Admitting: Podiatry

## 2014-10-08 VITALS — BP 108/70 | HR 95 | Resp 16

## 2014-10-08 DIAGNOSIS — M722 Plantar fascial fibromatosis: Secondary | ICD-10-CM

## 2014-10-09 NOTE — Progress Notes (Signed)
He presents today for a chief complaint and a follow-up of his plantar fibroma of his left foot. He states that he seems to be resolved by approximately 80% and low longer hurts.  Objective: Vital signs are stable he is alert and oriented 3. Much decrease in edema and nodularity of the plantar fibroma medial longitudinal arch of the left foot. There is no skin breakdown and pulses remain palpable.  Assessment: Decreased plantar fibroma left foot.  Plan: Continue conservative therapies will follow up with him in 1 month.

## 2014-11-17 ENCOUNTER — Ambulatory Visit (INDEPENDENT_AMBULATORY_CARE_PROVIDER_SITE_OTHER): Payer: Commercial Managed Care - HMO | Admitting: Podiatry

## 2014-11-17 ENCOUNTER — Ambulatory Visit: Payer: Commercial Managed Care - HMO | Admitting: Podiatry

## 2014-11-17 DIAGNOSIS — M722 Plantar fascial fibromatosis: Secondary | ICD-10-CM

## 2014-11-17 NOTE — Progress Notes (Signed)
He presents today with his wife with a chief complaint of plantar fibroma to his left foot. He states that he has gotten smaller and the pain has subsided to some degree. However he would like from [place] to take a look at the nodule and consider another injection.  Objective: Vital signs are stable he is alert and oriented 3. He has pain on palpation to the largest of the plantar fibromas centrally located in the plantar medial band of the plantar fascia of his left foot. There is no erythema there is no skin breakdown from previous injections no attenuation or thinning of the skin.  Assessment: Plantar fibromatosis left foot improving.  Plan: Injected his larger of the lesions today with Kenalog and local anesthetic and I'll follow-up with him in a proximally 6 weeks.

## 2014-12-29 ENCOUNTER — Ambulatory Visit: Payer: Commercial Managed Care - HMO | Admitting: Podiatry

## 2015-01-26 ENCOUNTER — Encounter (HOSPITAL_COMMUNITY): Payer: Self-pay | Admitting: Psychiatry

## 2015-01-26 ENCOUNTER — Ambulatory Visit (INDEPENDENT_AMBULATORY_CARE_PROVIDER_SITE_OTHER): Payer: Medicare HMO | Admitting: Psychiatry

## 2015-01-26 VITALS — BP 174/126 | HR 66 | Ht 74.0 in | Wt 284.0 lb

## 2015-01-26 DIAGNOSIS — G4700 Insomnia, unspecified: Secondary | ICD-10-CM

## 2015-01-26 DIAGNOSIS — F429 Obsessive-compulsive disorder, unspecified: Secondary | ICD-10-CM

## 2015-01-26 DIAGNOSIS — F200 Paranoid schizophrenia: Secondary | ICD-10-CM

## 2015-01-26 DIAGNOSIS — Z0000 Encounter for general adult medical examination without abnormal findings: Secondary | ICD-10-CM

## 2015-01-26 DIAGNOSIS — F411 Generalized anxiety disorder: Secondary | ICD-10-CM | POA: Insufficient documentation

## 2015-01-26 DIAGNOSIS — F333 Major depressive disorder, recurrent, severe with psychotic symptoms: Secondary | ICD-10-CM

## 2015-01-26 DIAGNOSIS — F42 Obsessive-compulsive disorder: Secondary | ICD-10-CM

## 2015-01-26 DIAGNOSIS — F410 Panic disorder [episodic paroxysmal anxiety] without agoraphobia: Secondary | ICD-10-CM | POA: Insufficient documentation

## 2015-01-26 MED ORDER — ESCITALOPRAM OXALATE 20 MG PO TABS: 20.0000 mg | ORAL_TABLET | Freq: Every day | ORAL | Status: DC

## 2015-01-26 MED ORDER — QUETIAPINE FUMARATE 200 MG PO TABS: 300.0000 mg | ORAL_TABLET | Freq: Every day | ORAL | Status: DC

## 2015-01-26 MED ORDER — ZOLPIDEM TARTRATE 10 MG PO TABS: 10.0000 mg | ORAL_TABLET | Freq: Every evening | ORAL | Status: DC | PRN
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
## 2015-01-26 NOTE — Patient Instructions (Signed)
1. Labs 2. Call [phone] to schedule EKG

## 2015-03-30 ENCOUNTER — Other Ambulatory Visit: Payer: Self-pay | Admitting: Cardiology

## 2015-03-30 ENCOUNTER — Other Ambulatory Visit (HOSPITAL_COMMUNITY): Payer: Self-pay

## 2015-03-30 DIAGNOSIS — R0789 Other chest pain: Secondary | ICD-10-CM

## 2015-03-30 LAB — COMPREHENSIVE METABOLIC PANEL
ALBUMIN: 3.8 g/dL (ref 3.5–5.2)
ALT: 298 U/L — AB (ref 0–53)
AST: 182 U/L — AB (ref 0–37)
Alkaline Phosphatase: 64 U/L (ref 39–117)
BUN: 17 mg/dL (ref 6–23)
CO2: 29 meq/L (ref 19–32)
Calcium: 9.2 mg/dL (ref 8.4–10.5)
Chloride: 102 mEq/L (ref 96–112)
Creat: 1.19 mg/dL (ref 0.50–1.35)
Glucose, Bld: 79 mg/dL (ref 70–99)
POTASSIUM: 4.5 meq/L (ref 3.5–5.3)
SODIUM: 138 meq/L (ref 135–145)
TOTAL PROTEIN: 8 g/dL (ref 6.0–8.3)
Total Bilirubin: 0.4 mg/dL (ref 0.2–1.2)

## 2015-03-30 LAB — LIPID PANEL
CHOL/HDL RATIO: 3.6 ratio
CHOLESTEROL: 149 mg/dL (ref 0–200)
HDL: 41 mg/dL (ref >=40)
LDL Cholesterol: 84 mg/dL (ref 0–99)
Triglycerides: 119 mg/dL (ref <150)
VLDL: 24 mg/dL (ref 0–40)

## 2015-03-30 LAB — CBC
HCT: 42.9 % (ref 39.0–52.0)
Hemoglobin: 14.1 g/dL (ref 13.0–17.0)
MCH: 27.3 pg (ref 26.0–34.0)
MCHC: 32.9 g/dL (ref 30.0–36.0)
MCV: 83.1 fL (ref 78.0–100.0)
MPV: 9.6 fL (ref 8.6–12.4)
PLATELETS: 235 10*3/uL (ref 150–400)
RBC: 5.16 MIL/uL (ref 4.22–5.81)
RDW: 14.1 % (ref 11.5–15.5)
WBC: 5.8 10*3/uL (ref 4.0–10.5)

## 2015-03-30 LAB — TSH: TSH: 1.368 u[IU]/mL (ref 0.350–4.500)

## 2015-03-30 LAB — HEMOGLOBIN A1C
Hgb A1c MFr Bld: 7.3 % — ABNORMAL HIGH (ref <5.7)
MEAN PLASMA GLUCOSE: 163 mg/dL — AB (ref <117)

## 2015-03-30 LAB — PROLACTIN: Prolactin: 4.3 ng/mL (ref 2.1–17.1)

## 2015-04-01 ENCOUNTER — Other Ambulatory Visit (HOSPITAL_COMMUNITY): Payer: Self-pay

## 2015-04-01 ENCOUNTER — Encounter (HOSPITAL_COMMUNITY): Payer: Self-pay | Admitting: Psychiatry

## 2015-04-01 ENCOUNTER — Ambulatory Visit (INDEPENDENT_AMBULATORY_CARE_PROVIDER_SITE_OTHER): Payer: Medicare HMO | Admitting: Psychiatry

## 2015-04-01 VITALS — BP 148/90 | HR 86 | Ht 74.0 in | Wt 275.0 lb

## 2015-04-01 DIAGNOSIS — F200 Paranoid schizophrenia: Secondary | ICD-10-CM | POA: Diagnosis not present

## 2015-04-01 DIAGNOSIS — F410 Panic disorder [episodic paroxysmal anxiety] without agoraphobia: Secondary | ICD-10-CM

## 2015-04-01 DIAGNOSIS — F42 Obsessive-compulsive disorder: Secondary | ICD-10-CM | POA: Diagnosis not present

## 2015-04-01 DIAGNOSIS — F411 Generalized anxiety disorder: Secondary | ICD-10-CM

## 2015-04-01 DIAGNOSIS — F333 Major depressive disorder, recurrent, severe with psychotic symptoms: Secondary | ICD-10-CM | POA: Diagnosis not present

## 2015-04-01 DIAGNOSIS — G4700 Insomnia, unspecified: Secondary | ICD-10-CM

## 2015-04-01 DIAGNOSIS — F429 Obsessive-compulsive disorder, unspecified: Secondary | ICD-10-CM

## 2015-04-01 MED ORDER — ZOLPIDEM TARTRATE 10 MG PO TABS: 10.0000 mg | ORAL_TABLET | Freq: Every evening | ORAL | Status: DC | PRN

## 2015-04-01 MED ORDER — QUETIAPINE FUMARATE 200 MG PO TABS: 300.0000 mg | ORAL_TABLET | Freq: Every day | ORAL | Status: DC

## 2015-04-01 MED ORDER — ESCITALOPRAM OXALATE 20 MG PO TABS: 20.0000 mg | ORAL_TABLET | Freq: Every day | ORAL | Status: DC
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
## 2015-04-02 ENCOUNTER — Encounter (HOSPITAL_COMMUNITY)
Admission: RE | Admit: 2015-04-02 | Discharge: 2015-04-02 | Disposition: A | Discharge status: Home / Self Care | Payer: Commercial Managed Care - HMO | Source: Ambulatory Visit | Attending: Cardiology | Admitting: Cardiology

## 2015-04-02 ENCOUNTER — Ambulatory Visit (HOSPITAL_COMMUNITY)
Admission: RE | Admit: 2015-04-02 | Discharge: 2015-04-02 | Disposition: A | Discharge status: Home / Self Care | Payer: Commercial Managed Care - HMO | Source: Ambulatory Visit | Attending: Cardiology | Admitting: Cardiology

## 2015-04-02 DIAGNOSIS — R0789 Other chest pain: Secondary | ICD-10-CM | POA: Diagnosis not present

## 2015-04-02 DIAGNOSIS — R9439 Abnormal result of other cardiovascular function study: Secondary | ICD-10-CM | POA: Insufficient documentation

## 2015-04-02 MED ORDER — REGADENOSON 0.4 MG/5ML IV SOLN
INTRAVENOUS | Status: AC
  Filled 2015-04-02: qty 5

## 2015-04-02 MED ORDER — TECHNETIUM TC 99M SESTAMIBI GENERIC - CARDIOLITE
30.0000 | Freq: Once | INTRAVENOUS | Status: AC | PRN
  Administered 2015-04-02: 30 via INTRAVENOUS

## 2015-04-02 MED ORDER — REGADENOSON 0.4 MG/5ML IV SOLN
0.4000 mg | Freq: Once | INTRAVENOUS | Status: AC
  Administered 2015-04-02: 0.4 mg via INTRAVENOUS

## 2015-04-02 MED ORDER — TECHNETIUM TC 99M SESTAMIBI GENERIC - CARDIOLITE
10.0000 | Freq: Once | INTRAVENOUS | Status: AC | PRN
  Administered 2015-04-02: 10 via INTRAVENOUS

## 2015-04-06 LAB — NM MYOCAR MULTI W/SPECT W/WALL MOTION / EF
CHL CUP NUCLEAR SDS: 4
CHL CUP NUCLEAR SRS: 5
LV dias vol: 168 mL
LV sys vol: 86 mL
NUC STRESS TID: 1.45
Nuc Stress EF: 49 %
RATE: 0.23
SSS: 9

## 2015-04-28 ENCOUNTER — Ambulatory Visit (HOSPITAL_COMMUNITY): Payer: Self-pay | Admitting: Clinical

## 2015-05-04 ENCOUNTER — Ambulatory Visit (HOSPITAL_COMMUNITY): Payer: Self-pay | Admitting: Psychiatry

## 2015-05-06 ENCOUNTER — Ambulatory Visit (HOSPITAL_COMMUNITY): Payer: Self-pay | Admitting: Psychiatry

## 2015-05-13 ENCOUNTER — Ambulatory Visit (HOSPITAL_COMMUNITY): Payer: Self-pay | Admitting: Clinical

## 2019-09-28 DIAGNOSIS — I509 Heart failure, unspecified: Secondary | ICD-10-CM

## 2019-09-28 DIAGNOSIS — Z9889 Other specified postprocedural states: Secondary | ICD-10-CM

## 2019-09-28 HISTORY — DX: Other specified postprocedural states: Z98.890

## 2019-09-28 HISTORY — DX: Heart failure, unspecified: I50.9

## 2019-10-11 ENCOUNTER — Emergency Department (HOSPITAL_COMMUNITY): Payer: Medicare Other

## 2019-10-11 ENCOUNTER — Encounter (HOSPITAL_COMMUNITY): Payer: Self-pay | Admitting: Emergency Medicine

## 2019-10-11 ENCOUNTER — Inpatient Hospital Stay (HOSPITAL_COMMUNITY)
Admission: EM | Admit: 2019-10-11 | Discharge: 2019-10-16 | DRG: 286 | Disposition: A | Discharge status: Home / Self Care | Payer: Medicare Other | Attending: Internal Medicine | Admitting: Internal Medicine

## 2019-10-11 ENCOUNTER — Other Ambulatory Visit: Payer: Self-pay

## 2019-10-11 DIAGNOSIS — I255 Ischemic cardiomyopathy: Secondary | ICD-10-CM | POA: Diagnosis present

## 2019-10-11 DIAGNOSIS — E1165 Type 2 diabetes mellitus with hyperglycemia: Secondary | ICD-10-CM | POA: Diagnosis present

## 2019-10-11 DIAGNOSIS — Z9114 Patient's other noncompliance with medication regimen: Secondary | ICD-10-CM | POA: Diagnosis not present

## 2019-10-11 DIAGNOSIS — I472 Ventricular tachycardia: Secondary | ICD-10-CM | POA: Diagnosis not present

## 2019-10-11 DIAGNOSIS — Z20828 Contact with and (suspected) exposure to other viral communicable diseases: Secondary | ICD-10-CM | POA: Diagnosis present

## 2019-10-11 DIAGNOSIS — F209 Schizophrenia, unspecified: Secondary | ICD-10-CM | POA: Diagnosis present

## 2019-10-11 DIAGNOSIS — I493 Ventricular premature depolarization: Secondary | ICD-10-CM | POA: Diagnosis present

## 2019-10-11 DIAGNOSIS — I2582 Chronic total occlusion of coronary artery: Secondary | ICD-10-CM | POA: Diagnosis present

## 2019-10-11 DIAGNOSIS — J189 Pneumonia, unspecified organism: Secondary | ICD-10-CM | POA: Diagnosis present

## 2019-10-11 DIAGNOSIS — I471 Supraventricular tachycardia: Secondary | ICD-10-CM | POA: Diagnosis present

## 2019-10-11 DIAGNOSIS — E785 Hyperlipidemia, unspecified: Secondary | ICD-10-CM | POA: Diagnosis present

## 2019-10-11 DIAGNOSIS — I110 Hypertensive heart disease with heart failure: Principal | ICD-10-CM | POA: Diagnosis present

## 2019-10-11 DIAGNOSIS — I5021 Acute systolic (congestive) heart failure: Secondary | ICD-10-CM | POA: Diagnosis present

## 2019-10-11 DIAGNOSIS — F329 Major depressive disorder, single episode, unspecified: Secondary | ICD-10-CM | POA: Diagnosis present

## 2019-10-11 DIAGNOSIS — I4891 Unspecified atrial fibrillation: Secondary | ICD-10-CM | POA: Diagnosis present

## 2019-10-11 DIAGNOSIS — E669 Obesity, unspecified: Secondary | ICD-10-CM | POA: Diagnosis present

## 2019-10-11 DIAGNOSIS — R7989 Other specified abnormal findings of blood chemistry: Secondary | ICD-10-CM

## 2019-10-11 DIAGNOSIS — Z6837 Body mass index (BMI) 37.0-37.9, adult: Secondary | ICD-10-CM

## 2019-10-11 DIAGNOSIS — Z8249 Family history of ischemic heart disease and other diseases of the circulatory system: Secondary | ICD-10-CM

## 2019-10-11 DIAGNOSIS — I25118 Atherosclerotic heart disease of native coronary artery with other forms of angina pectoris: Secondary | ICD-10-CM | POA: Diagnosis present

## 2019-10-11 DIAGNOSIS — F411 Generalized anxiety disorder: Secondary | ICD-10-CM | POA: Diagnosis present

## 2019-10-11 DIAGNOSIS — Z818 Family history of other mental and behavioral disorders: Secondary | ICD-10-CM

## 2019-10-11 DIAGNOSIS — I700 Atherosclerosis of aorta: Secondary | ICD-10-CM | POA: Diagnosis present

## 2019-10-11 DIAGNOSIS — Z79899 Other long term (current) drug therapy: Secondary | ICD-10-CM | POA: Diagnosis not present

## 2019-10-11 DIAGNOSIS — I272 Pulmonary hypertension, unspecified: Secondary | ICD-10-CM | POA: Diagnosis present

## 2019-10-11 DIAGNOSIS — Z7982 Long term (current) use of aspirin: Secondary | ICD-10-CM | POA: Diagnosis not present

## 2019-10-11 DIAGNOSIS — E876 Hypokalemia: Secondary | ICD-10-CM | POA: Diagnosis present

## 2019-10-11 DIAGNOSIS — Z23 Encounter for immunization: Secondary | ICD-10-CM

## 2019-10-11 DIAGNOSIS — Z955 Presence of coronary angioplasty implant and graft: Secondary | ICD-10-CM | POA: Diagnosis not present

## 2019-10-11 DIAGNOSIS — I509 Heart failure, unspecified: Secondary | ICD-10-CM

## 2019-10-11 DIAGNOSIS — J9691 Respiratory failure, unspecified with hypoxia: Secondary | ICD-10-CM | POA: Diagnosis present

## 2019-10-11 LAB — CBC WITH DIFFERENTIAL/PLATELET
Abs Immature Granulocytes: 0.03 10*3/uL (ref 0.00–0.07)
Basophils Absolute: 0.1 10*3/uL (ref 0.0–0.1)
Basophils Relative: 1 %
Eosinophils Absolute: 0.2 10*3/uL (ref 0.0–0.5)
Eosinophils Relative: 2 %
HCT: 41.5 % (ref 39.0–52.0)
Hemoglobin: 12.5 g/dL — ABNORMAL LOW (ref 13.0–17.0)
Immature Granulocytes: 0 %
Lymphocytes Relative: 9 %
Lymphs Abs: 0.9 10*3/uL (ref 0.7–4.0)
MCH: 25.7 pg — ABNORMAL LOW (ref 26.0–34.0)
MCHC: 30.1 g/dL (ref 30.0–36.0)
MCV: 85.4 fL (ref 80.0–100.0)
Monocytes Absolute: 0.7 10*3/uL (ref 0.1–1.0)
Monocytes Relative: 7 %
Neutro Abs: 8.7 10*3/uL — ABNORMAL HIGH (ref 1.7–7.7)
Neutrophils Relative %: 81 %
Platelets: 317 10*3/uL (ref 150–400)
RBC: 4.86 MIL/uL (ref 4.22–5.81)
RDW: 15 % (ref 11.5–15.5)
WBC: 10.5 10*3/uL (ref 4.0–10.5)
nRBC: 0 % (ref 0.0–0.2)

## 2019-10-11 LAB — BASIC METABOLIC PANEL
Anion gap: 11 (ref 5–15)
BUN: 8 mg/dL (ref 6–20)
CO2: 22 mmol/L (ref 22–32)
Calcium: 8.4 mg/dL — ABNORMAL LOW (ref 8.9–10.3)
Chloride: 103 mmol/L (ref 98–111)
Creatinine, Ser: 0.83 mg/dL (ref 0.61–1.24)
GFR calc Af Amer: >60 mL/min (ref >60)
GFR calc non Af Amer: >60 mL/min (ref >60)
Glucose, Bld: 239 mg/dL — ABNORMAL HIGH (ref 70–99)
Potassium: 3.2 mmol/L — ABNORMAL LOW (ref 3.5–5.1)
Sodium: 136 mmol/L (ref 135–145)

## 2019-10-11 LAB — TROPONIN I (HIGH SENSITIVITY)
Troponin I (High Sensitivity): 74 ng/L — ABNORMAL HIGH (ref <18)
Troponin I (High Sensitivity): 68 ng/L — ABNORMAL HIGH (ref <18)

## 2019-10-11 LAB — BRAIN NATRIURETIC PEPTIDE: B Natriuretic Peptide: 1202.2 pg/mL — ABNORMAL HIGH (ref 0.0–100.0)

## 2019-10-11 MED ORDER — ASPIRIN 81 MG PO CHEW
324.0000 mg | CHEWABLE_TABLET | Freq: Once | ORAL | Status: AC
  Administered 2019-10-11: 19:00:00 324 mg via ORAL
  Filled 2019-10-11: qty 4

## 2019-10-11 MED ORDER — NITROGLYCERIN 0.4 MG SL SUBL
0.4000 mg | SUBLINGUAL_TABLET | SUBLINGUAL | Status: DC | PRN
  Administered 2019-10-11: 19:00:00 0.4 mg via SUBLINGUAL
  Filled 2019-10-11: qty 1

## 2019-10-11 MED ORDER — FUROSEMIDE 10 MG/ML IJ SOLN
40.0000 mg | Freq: Two times a day (BID) | INTRAMUSCULAR | Status: DC
  Administered 2019-10-12 – 2019-10-15 (×7): 40 mg via INTRAVENOUS
  Filled 2019-10-11 (×8): qty 4

## 2019-10-11 MED ORDER — METOPROLOL TARTRATE 5 MG/5ML IV SOLN
5.0000 mg | Freq: Once | INTRAVENOUS | Status: AC
  Administered 2019-10-11: 5 mg via INTRAVENOUS
  Filled 2019-10-11: qty 5

## 2019-10-11 MED ORDER — SODIUM CHLORIDE 0.9 % IV SOLN
1.0000 g | Freq: Once | INTRAVENOUS | Status: AC
  Administered 2019-10-11: 1 g via INTRAVENOUS
  Filled 2019-10-11: qty 10

## 2019-10-11 MED ORDER — IOHEXOL 350 MG/ML SOLN
100.0000 mL | Freq: Once | INTRAVENOUS | Status: AC | PRN
  Administered 2019-10-11: 100 mL via INTRAVENOUS

## 2019-10-11 MED ORDER — METOPROLOL TARTRATE 25 MG PO TABS
25.0000 mg | ORAL_TABLET | Freq: Two times a day (BID) | ORAL | Status: DC
  Administered 2019-10-12: 25 mg via ORAL
  Filled 2019-10-11: qty 1

## 2019-10-11 MED ORDER — APIXABAN 5 MG PO TABS
5.0000 mg | ORAL_TABLET | Freq: Two times a day (BID) | ORAL | Status: DC
  Administered 2019-10-11 – 2019-10-12 (×2): 5 mg via ORAL
  Filled 2019-10-11 (×2): qty 1

## 2019-10-11 MED ORDER — POTASSIUM CHLORIDE CRYS ER 20 MEQ PO TBCR
40.0000 meq | EXTENDED_RELEASE_TABLET | ORAL | Status: AC
  Administered 2019-10-11 – 2019-10-12 (×2): 40 meq via ORAL
  Filled 2019-10-11 (×2): qty 2

## 2019-10-11 MED ORDER — FUROSEMIDE 10 MG/ML IJ SOLN
40.0000 mg | Freq: Once | INTRAMUSCULAR | Status: AC
  Administered 2019-10-11: 19:00:00 40 mg via INTRAVENOUS
  Filled 2019-10-11: qty 4

## 2019-10-11 NOTE — ED Notes (Signed)
Pt O2 dropped to 97% when [place] was removed. [place] back on at 3liters and pt is 94%

## 2019-10-11 NOTE — ED Triage Notes (Signed)
Pt arrives by [facility] with complaints of SOB X1 week with it becoming increasing worse. Pt endorses CP, diaphoresis, emesis since last night.  Pt SpO2 81%. EMS placed pt on NRB with relief.  Per EMS Pt given 10mg  albuterol 0.5 atrovent 125 solumedrol 2g mag.

## 2019-10-11 NOTE — Consult Note (Signed)
Cardiology Consultation:   Patient ID: [name]; [id number]; [birth year]   Admit date: 10/11/2019 Date of Consult: 10/11/2019  Primary Care Provider: Nolene Ebbs, MD Primary Cardiologist: No primary care provider on file. Primary Electrophysiologist:  None  Chief Complaint: dyspnea  Patient Profile:   [name] is a 58 y.o. male with a hx of CAD s/p PCI to OM in 2012, DM, tobacco use who is being seen today for the evaluation of dyspnea.  History of Present Illness:   Patient reports about a month of dyspnea, productive cough, subjective fevers and night sweats, and chest tightness.  Dyspnea has been progressive over the last month and worse when lying down.  Cough is productive of phlegm.  Has had sweats at night.  Also describes some chest tightness which is intermittent and not associated with exertion.  States that all symptoms have been present for about a month.  Came to the ED today for worsening of his symptoms.  Was hypoxic to 89% on arrival, with improvement to 94% on 3 L.  Blood pressure 140/115, heart rate 110s in A. fib.  Chest CT no PE, pulmonary edema with likely superimposed pneumonia.  ECG A. fib with T wave inversions.  Labs creatinine 0.8, potassium 3.2 WBC 10.5, Hgb 12.5, troponin 74-68.  Patient has a history of CAD and underwent PCI to an OM branch in 2012.  Since then has followed up only intermittently.  Is to have had a nuclear stress in 2016 with small areas of ischemia in the LAD territory and LVEF 45 to 54%.  Is unclear what medications he is taking.  Does not remember being told he has A. fib.  Past Medical History:  Diagnosis Date  . Anxiety   . CAD (coronary artery disease)   . Depression   . Diabetes mellitus   . Hypertension   . Schizophrenia [facility])     Past Surgical History:  Procedure Laterality Date  . CORONARY STENT PLACEMENT    . HERNIA REPAIR       Inpatient Medications: Scheduled Meds: . metoprolol  tartrate  5 mg Intravenous Once   Continuous Infusions: . cefTRIAXone (ROCEPHIN)  IV     PRN Meds: nitroGLYCERIN  Home Meds: Prior to Admission medications   Medication Sig Start Date End Date Taking? Authorizing Provider  amLODipine (NORVASC) 10 MG tablet Take 10 mg by mouth daily.    [provider]  aspirin 81 MG tablet Take 81 mg by mouth daily.    [provider]  Canagliflozin-Metformin HCl (INVOKAMET PO) Take by mouth.    [provider]  CYCLOBENZAPRINE HCL PO Take by mouth.    [provider]  escitalopram (LEXAPRO) 20 MG tablet Take 1 tablet (20 mg total) by mouth daily. 04/01/15 03/31/16  Charlcie Cradle, MD  GABAPENTIN PO Take by mouth.    [provider]  LOSARTAN POTASSIUM PO Take by mouth.    [provider]  Nitroglycerin (NITROSTAT SL) Place under the tongue.    [provider]  omeprazole (PRILOSEC) 20 MG capsule Take 20 mg by mouth daily.    [provider]  penicillin v potassium (VEETID) 500 MG tablet Take 500 mg by mouth 4 (four) times daily.    [provider]  QUEtiapine (SEROQUEL) 100 MG tablet Take 50-300 mg by mouth See admin instructions. Take 1/2 tablet (50 mg) by mouth at bedtime on 1st night, then titrate up to 300 mg daily at bedtime as directed  [provider]  QUEtiapine (SEROQUEL) 200 MG tablet Take 1.5 tablets (300 mg total) by mouth at bedtime. Patient not taking: Reported on 10/11/2019 04/01/15   Charlcie Cradle, MD  saxagliptin HCl (ONGLYZA) 2.5 MG TABS tablet Take 2.5 mg by mouth daily.    [provider]  SIMVASTATIN PO Take by mouth.    [provider]  triamcinolone cream (KENALOG) 0.5 % Apply 1 application topically 3 (three) times daily.    [provider]  zolpidem (AMBIEN) 10 MG tablet Take 1 tablet (10 mg total) by mouth at bedtime as needed for sleep. Patient taking differently: Take 10 mg by mouth at bedtime.  04/01/15   Charlcie Cradle, MD    Allergies:   No Known Allergies  Social History:   Social History   Socioeconomic History  . Marital status: Legally Separated    Spouse name: Not on file  . Number of children: Not on file  . Years of education: Not on file  . Highest education level: Not on file  Occupational History  . Not on file  Social Needs  . Financial resource strain: Not on file  . Food insecurity    Worry: Not on file    Inability: Not on file  . Transportation needs    Medical: Not on file    Non-medical: Not on file  Tobacco Use  . Smoking status: Never Smoker  . Smokeless tobacco: Never Used  Substance and Sexual Activity  . Alcohol use: No  . Drug use: No  . Sexual activity: Not on file  Lifestyle  . Physical activity    Days per week: Not on file    Minutes per session: Not on file  . Stress: Not on file  Relationships  . Social Herbalist on phone: Not on file    Gets together: Not on file    Attends religious service: Not on file    Active member of club or organization: Not on file    Attends meetings of clubs or organizations: Not on file    Relationship status: Not on file  . Intimate partner violence    Fear of current or ex partner: Not on file    Emotionally abused: Not on file    Physically abused: Not on file    Forced sexual activity: Not on file  Other Topics Concern  . Not on file  Social History Narrative  . Not on file     Family History:    Family History  Problem Relation Age of Onset  . Heart failure Mother   . Mental illness Sister   . Mental illness Sister       ROS:  Please see the history of present illness.   All other ROS reviewed and negative.     Physical Exam/Data:   Vitals:   10/11/19 1800 10/11/19 1815 10/11/19 1830 10/11/19 1931  BP: 125/74 (!) 151/100 (!) 154/93 (!) 145/86  Pulse: 88 (!) 116 (!) 117 (!) 114  Resp: 19 19 (!) 23 18  Temp:      TempSrc:      SpO2: 91% 91% 91% 95%  Weight:      Height:        No intake or output data in the 24 hours ending 10/11/19 2026 Last 3 Weights 10/11/2019 07/28/2013 12/17/2012  Weight (lbs) 295 lb 294 lb 300 lb  Weight (kg) 133.811 kg 133.358 kg 136.079 kg  Some encounter information is  confidential and restricted. Go to Review Flowsheets activity to see all data.     Body mass index is 37.88 kg/m.  General: Well developed, well nourished, in no acute distress. Head: Normocephalic, atraumatic, sclera non-icteric, no xanthomas, nares are without discharge.  Neck: Negative for carotid bruits. JVD not elevated. Lungs: Clear bilaterally to auscultation without wheezes, rales, or rhonchi. Breathing is unlabored. Heart: RRR with S1 S2. No murmurs, rubs, or gallops appreciated. Abdomen: Soft, non-tender, non-distended with normoactive bowel sounds. No hepatomegaly. No rebound/guarding. No obvious abdominal masses. Msk:  Strength and tone appear normal for age. Extremities: No clubbing or cyanosis. No edema.  Distal pedal pulses are 2+ and equal bilaterally. Neuro: Alert and oriented X 3. No facial asymmetry. No focal deficit. Moves all extremities spontaneously. Psych:  Responds to questions appropriately with a normal affect.   EKG:  The EKG was personally reviewed and demonstrates:  Atrial fibrillation with TW inverions Telemetry:  Telemetry was personally reviewed and demonstrates:  Atrial fibrillation  Relevant CV Studies: 03/2015 SPECT  Findings consistent with ischemia.  The left ventricular ejection fraction is mildly decreased (45-54%).  This is an intermediate risk study.  Defect 1: There is a small defect present in the mid anterior location.  Defect 2: There is a small defect present in the apical septal location.   06/2014 SPECT IMPRESSION: 1. Negative for pharmacologic-stress induced ischemia. 2. Left ventricular ejection fraction 54%. 3. Small area of apical septal scar.   LHC 2014 LV was mildly enlarged.  There was moderate  LVH.  EF of 50%- 55%.  Left main was patent.  LAD has 20%-25% proximal and mid stenosis. Diagonal 1 and 2 were small which were patent.  Ramus has 15%-20% proximal stenosis.  Left circumflex has 60%-70% proximal stenosis.  OM-1 is very, very small.  OM-2 stented segment is patent.  RCA has 40%-50% proximal stenosis and 20%-25% mid stenosis.  PDA and PLV branches were very small.  The patient tolerated the procedure well.  There were no complications.  The patient was transferred to recovery room in stable condition.  Plan is to maximize antianginal medications.  If he gets recurrent chest pain, we will consider PCI to proximal left circumflex.   Laboratory Data:  High Sensitivity Troponin:   Recent Labs  Lab 10/11/19 1603 10/11/19 1841  TROPONINIHS 74* 68*     Cardiac EnzymesNo results for input(s): TROPONINI in the last 168 hours. No results for input(s): TROPIPOC in the last 168 hours.  Chemistry Recent Labs  Lab 10/11/19 1603  NA 136  K 3.2*  CL 103  CO2 22  GLUCOSE 239*  BUN 8  CREATININE 0.83  CALCIUM 8.4*  GFRNONAA >60  GFRAA >60  ANIONGAP 11    No results for input(s): PROT, ALBUMIN, AST, ALT, ALKPHOS, BILITOT in the last 168 hours. Hematology Recent Labs  Lab 10/11/19 1603  WBC 10.5  RBC 4.86  HGB 12.5*  HCT 41.5  MCV 85.4  MCH 25.7*  MCHC 30.1  RDW 15.0  PLT 317   BNP Recent Labs  Lab 10/11/19 1603  BNP 1,202.2*    DDimer No results for input(s): DDIMER in the last 168 hours.   Radiology/Studies:  CT PE Protocol IMPRESSION: 1. No pulmonary embolism. 2. Signs of heart failure likely with superimposed pneumonia. 3. Narrowing of mainstem bronchi distal trachea may be due to bronchomalacia or may be physiologic in the setting of edema and pneumonia. 4. Mild aortic atherosclerosis with signs of extensive coronary artery  disease. 5. Mediastinal nodal enlargement may be reactive. Consider follow-up after resolution of acute symptoms to ensure  resolution.    Assessment and Plan:   1. Acute heart failure exacerbation Has had about a month of dyspnea and appears to be in heart failure based on chest x-ray/CT, BNP, and exam.  Has not been following regularly with a cardiologist.  Unclear what his baseline LVEF is but on nuclear study in 2016 was 45 to 54%.  Will need an echocardiogram and IV diuretics likely for a few days.  He also appears to have a superimposed pneumonia by imaging, and describes a productive cough and subjective fevers.  Agree with admission to medicine service for treatment of concomitant pneumonia and hypoxic respiratory failure.  - Admission to medicine for treatment of pneumonia and heart failure.   - 40 mg IV Lasix given in the ED, with good response. Will schedule 40 mg IV twice daily. - Echocardiogram tomorrow - may need stress test or cath prior to discharge based on echo results - Will need beta-blocker, ACE inhibitor, and spironolactone prior to discharge   2. Elevated troponin Troponin elevated on admission with trend of 74-68.  Think this is probably from heart failure and not acute MI.  He does have some chest tightness, but his symptoms are atypical and are not associated with exertion and did not acutely worsen today.  His ECG does have some T wave inversions suggestive of ischemia, but this may be due to the tachycardia in the setting of A. fib or his heart failure.  For now we will plan to monitor his symptoms and get an echo as described above.  3. Newly diagnosed AF Was found to be in A. fib on admission with heart rates in the 110s.  This is probably contributing to his symptoms and his heart failure.  Would initiate some rate control with oral metoprolol for now.  Would likely benefit from rhythm control given his heart failure, and we can consider a TEE/cardioversion +/-antiarrhythmic agent once he is more euvolemic.  CHA2DS2-VASc is 4 (CAD, hypertension, DM, heart failure).  Would start apixaban  tonight as he may undergo cardioversion in the near future.  - start metoprolol 25 mg BID - start apixaban 5 mg BID (cost may be an issue)      For questions or updates, please contact [place] Please consult [email or web address] for contact info under     Signed, Chriss Czar, MD  10/11/2019 8:26 PM

## 2019-10-11 NOTE — ED Provider Notes (Signed)
[facility] EMERGENCY DEPARTMENT Provider Note   CSN: [id number] Arrival date & time: 10/11/19  1553     History   Chief Complaint Chief Complaint  Patient presents with  . Shortness of Breath    HPI [name] is a 58 y.o. male.     Patient is a 58 year old gentleman with past medical history of CHF, coronary artery disease, depression, diabetes, hypertension, schizophrenia presenting to the emergency department for shortness of breath.  Patient reports worsening shortness of breath over the course of 2 weeks.  He also endorses cough, fever and orthopnea and dyspnea on exertion.  He arrived via EMS on 3 L of oxygen.  Patient does not normally require home oxygen.  He does endorse some mild chest tightness which occurs with coughing and deep breaths.  He denies any increase in leg swelling, nausea, vomiting. He is s/p PCI in 2015.  Denies any known sick contacts.  He does report that he has not seen any kind of primary care doctor in about 4 years and is not taking any regular medications.     Past Medical History:  Diagnosis Date  . Anxiety   . CAD (coronary artery disease)   . Depression   . Diabetes mellitus   . Hypertension   . Schizophrenia [name]     Patient Active Problem List   Diagnosis Date Noted  . Paranoid schizophrenia, chronic condition ([place]) 01/26/2015  . Severe recurrent major depressive disorder with psychotic features ([place]) 01/26/2015  . GAD (generalized anxiety disorder) 01/26/2015  . OCD (obsessive compulsive disorder) 01/26/2015  . Panic disorder without agoraphobia 01/26/2015  . Insomnia 01/26/2015    Past Surgical History:  Procedure Laterality Date  . CORONARY STENT PLACEMENT    . HERNIA REPAIR          Home Medications    Prior to Admission medications   Medication Sig Start Date End Date Taking? Authorizing Provider  amLODipine (NORVASC) 10 MG tablet Take 10 mg by mouth daily.    [provider]   aspirin 81 MG tablet Take 81 mg by mouth daily.    [provider]  Canagliflozin-Metformin HCl (INVOKAMET PO) Take by mouth.    [provider]  CYCLOBENZAPRINE HCL PO Take by mouth.    [provider]  escitalopram (LEXAPRO) 20 MG tablet Take 1 tablet (20 mg total) by mouth daily. 04/01/15 03/31/16  Charlcie Cradle, MD  GABAPENTIN PO Take by mouth.    [provider]  LOSARTAN POTASSIUM PO Take by mouth.    [provider]  Nitroglycerin (NITROSTAT SL) Place under the tongue.    [provider]  omeprazole (PRILOSEC) 20 MG capsule Take 20 mg by mouth daily.    [provider]  penicillin v potassium (VEETID) 500 MG tablet Take 500 mg by mouth 4 (four) times daily.    [provider]  QUEtiapine (SEROQUEL) 100 MG tablet Take 50-300 mg by mouth See admin instructions. Take 1/2 tablet (50 mg) by mouth at bedtime on 1st night, then titrate up to 300 mg daily at bedtime as directed    [provider]  QUEtiapine (SEROQUEL) 200 MG tablet Take 1.5 tablets (300 mg total) by mouth at bedtime. Patient not taking: Reported on 10/11/2019 04/01/15   Charlcie Cradle, MD  saxagliptin HCl (ONGLYZA) 2.5 MG TABS tablet Take 2.5 mg by mouth daily.    [provider]  SIMVASTATIN PO Take by mouth.    [provider]  triamcinolone cream (KENALOG) 0.5 % Apply 1 application topically 3 (three) times daily.    [provider]  zolpidem (AMBIEN) 10 MG tablet Take 1 tablet (10 mg total) by mouth at bedtime as needed for sleep. Patient taking differently: Take 10 mg by mouth at bedtime.  04/01/15   Charlcie Cradle, MD    Family History Family History  Problem Relation Age of Onset  . Heart failure Mother   . Mental illness Sister   . Mental illness Sister     Social History Social History   Tobacco Use  . Smoking status: Never Smoker  . Smokeless tobacco: Never Used  Substance Use Topics  . Alcohol use: No   . Drug use: No     Allergies   Patient has no known allergies.   Review of Systems Review of Systems  Constitutional: Positive for appetite change, chills and fever. Negative for activity change, diaphoresis and fatigue.  HENT: Negative for congestion and sore throat.   Respiratory: Positive for cough, chest tightness and shortness of breath. Negative for apnea, choking, wheezing and stridor.   Cardiovascular: Negative for chest pain, palpitations and leg swelling.  Gastrointestinal: Negative for abdominal pain, nausea and vomiting.  Genitourinary: Negative for dysuria.  Musculoskeletal: Negative for back pain.  Skin: Negative for rash.  Allergic/Immunologic: Negative for immunocompromised state.  Neurological: Negative for weakness.     Physical Exam Updated Vital Signs BP (!) 150/102   Pulse 80   Temp 99.2 F (37.3 C) (Oral)   Resp 18   Ht 6\' 2"  (1.88 m)   Wt 133.8 kg   SpO2 95%   BMI 37.88 kg/m   Physical Exam Vitals signs and nursing note reviewed.  Constitutional:      General: He is not in acute distress.    Appearance: Normal appearance. He is well-developed. He is obese. He is not ill-appearing, toxic-appearing or diaphoretic.     Interventions: He is not intubated. HENT:     Head: Normocephalic.  Eyes:     Conjunctiva/sclera: Conjunctivae normal.  Cardiovascular:     Rate and Rhythm: Normal rate and regular rhythm.  Pulmonary:     Effort: Pulmonary effort is normal. Tachypnea present. No bradypnea, accessory muscle usage or respiratory distress. He is not intubated.     Breath sounds: No stridor. Examination of the right-lower field reveals rales. Examination of the left-lower field reveals wheezing and rales. Wheezing and rales present.  Musculoskeletal:     Right lower leg: Edema present.     Left lower leg: Edema present.     Comments: 1+ pitting bilateral lower extremity edema  Skin:    General: Skin is warm and dry.  Neurological:     General:  No focal deficit present.     Mental Status: He is alert.  Psychiatric:        Mood and Affect: Mood normal.      ED Treatments / Results  Labs (all labs ordered are listed, but only abnormal results are displayed) Labs Reviewed  BASIC METABOLIC PANEL - Abnormal; Notable for the following components:      Result Value   Potassium 3.2 (*)    Glucose, Bld 239 (*)    Calcium 8.4 (*)    All other components within normal limits  CBC WITH DIFFERENTIAL/PLATELET - Abnormal; Notable for the following components:   Hemoglobin 12.5 (*)    MCH 25.7 (*)    Neutro Abs 8.7 (*)    All  other components within normal limits  BRAIN NATRIURETIC PEPTIDE - Abnormal; Notable for the following components:   B Natriuretic Peptide 1,202.2 (*)    All other components within normal limits  TROPONIN I (HIGH SENSITIVITY) - Abnormal; Notable for the following components:   Troponin I (High Sensitivity) 74 (*)    All other components within normal limits  TROPONIN I (HIGH SENSITIVITY) - Abnormal; Notable for the following components:   Troponin I (High Sensitivity) 68 (*)    All other components within normal limits  SARS CORONAVIRUS 2 (TAT 6-24 HRS)    EKG EKG Interpretation  Date/Time:  Saturday October 11 2019 15:55:15 EST Ventricular Rate:  110 PR Interval:    QRS Duration: 103 QT Interval:  374 QTC Calculation: 506 R Axis:   86 Text Interpretation: Atrial fibrillation Abnormal T, consider ischemia, diffuse leads Prolonged QT interval Confirmed by Malvin Johns [phone]) on 10/11/2019 8:41:51 PM   Radiology Ct Angio Chest Pe W And/or Wo Contrast  Result Date: 10/11/2019 CLINICAL DATA:  Chest pain. EXAM: CT ANGIOGRAPHY CHEST WITH CONTRAST TECHNIQUE: Multidetector CT imaging of the chest was performed using the standard protocol during bolus administration of intravenous contrast. Multiplanar CT image reconstructions and MIPs were obtained to evaluate the vascular anatomy. CONTRAST:  182mL  OMNIPAQUE IOHEXOL 350 MG/ML SOLN COMPARISON:  Chest x-ray 10/11/2019 FINDINGS: Cardiovascular: Cardiovascular heart size is enlarged with no signs of pericardial effusion. Calcified coronary artery disease. Aortic caliber is normal. No signs of pulmonary embolism. Mediastinum/Nodes: Enlarged lymph nodes throughout the mediastinum. No axillary lymphadenopathy. No supraclavicular adenopathy. (Image 35, series 8) 2.5 cm right paratracheal lymph node. (Image 49, series 8): 1.8 cm lymph node in the subcarinal region along right lower lobe bronchus. Smaller lymph nodes scattered about the chest including in the AP window. No anterior mediastinal mass or adenopathy. Lungs/Pleura: Moderate right and small left pleural effusion layering dependently with interstitial nodular and ground-glass opacities in both right and left chest worse in the right upper lobe. Basilar airspace disease and volume loss. Narrowing of the bilateral mainstem bronchi. Upper Abdomen: No acute findings in the upper abdomen. Musculoskeletal: No acute bone finding or destructive bone process. Review of the MIP images confirms the above findings. IMPRESSION: 1. No pulmonary embolism. 2. Signs of heart failure likely with superimposed pneumonia. 3. Narrowing of mainstem bronchi distal trachea may be due to bronchomalacia or may be physiologic in the setting of edema and pneumonia. 4. Mild aortic atherosclerosis with signs of extensive coronary artery disease. 5. Mediastinal nodal enlargement may be reactive. Consider follow-up after resolution of acute symptoms to ensure resolution. Aortic Atherosclerosis (ICD10-I70.0). Electronically Signed   By: Zetta Bills M.D.   On: 10/11/2019 19:55   Dg Chest Port 1 View  Result Date: 10/11/2019 CLINICAL DATA:  Shortness of breath and chest pain EXAM: PORTABLE CHEST 1 VIEW COMPARISON:  09/23/2011 FINDINGS: The left lung is grossly clear. Ill-defined somewhat nodular airspace disease in the right upper lobe.  Borderline cardiomegaly. No significant pleural effusion. No pneumothorax. Appearance of pleural deformity at the right apex. IMPRESSION: 1. Ill-defined somewhat nodular appearing opacity in the right upper lobe which could be secondary to pneumonia versus lung nodules. Further evaluation with chest CT could be obtained. Electronically Signed   By: Donavan Foil M.D.   On: 10/11/2019 16:45    Procedures Procedures (including critical care time)  Medications Ordered in ED Medications  nitroGLYCERIN (NITROSTAT) SL tablet 0.4 mg (0.4 mg Sublingual Given 10/11/19 1901)  cefTRIAXone (ROCEPHIN) 1  g in sodium chloride 0.9 % 100 mL IVPB (1 g Intravenous New Bag/Given 10/11/19 2038)  potassium chloride SA (KLOR-CON) CR tablet 40 mEq (has no administration in time range)  aspirin chewable tablet 324 mg (324 mg Oral Given 10/11/19 1838)  iohexol (OMNIPAQUE) 350 MG/ML injection 100 mL (100 mLs Intravenous Contrast Given 10/11/19 1905)  furosemide (LASIX) injection 40 mg (40 mg Intravenous Given 10/11/19 1929)  metoprolol tartrate (LOPRESSOR) injection 5 mg (5 mg Intravenous Given 10/11/19 2036)     Initial Impression / Assessment and Plan / ED Course  I have reviewed the triage vital signs and the nursing notes.  Pertinent labs & imaging results that were available during my care of the patient were reviewed by me and considered in my medical decision making (see chart for details).  Clinical Course as of Oct 10 2102  Sat Oct 11, 2019  2025 I discussed with cardiology who reported to admit patient to medicine and they will consult on him.   [KM]  2102 Spoke with hospitalist who will admit patient for further workup and treatment of new onset afib with CHF exacerbation and superimposed pneumonia.    [KM]    Clinical Course User Index [KM] Alveria Apley, PA-C       CRITICAL CARE Performed by: Alveria Apley   Total critical care time: 35 minutes  Critical care time was exclusive of  separately billable procedures and treating other patients.  Critical care was necessary to treat or prevent imminent or life-threatening deterioration.  Critical care was time spent personally by me on the following activities: development of treatment plan with patient and/or surrogate as well as nursing, discussions with consultants, evaluation of patient's response to treatment, examination of patient, obtaining history from patient or surrogate, ordering and performing treatments and interventions, ordering and review of laboratory studies, ordering and review of radiographic studies, pulse oximetry and re-evaluation of patient's condition.   Final Clinical Impressions(s) / ED Diagnoses   Final diagnoses:  Acute on chronic congestive heart failure, unspecified heart failure type [facility])  Community acquired pneumonia, unspecified laterality  New onset a-fib [facility])    ED Discharge Orders    None       Kristine Royal 10/11/19 2103    Malvin Johns, MD 10/11/19 2108

## 2019-10-12 ENCOUNTER — Inpatient Hospital Stay (HOSPITAL_COMMUNITY): Payer: Medicare Other

## 2019-10-12 DIAGNOSIS — I10 Essential (primary) hypertension: Secondary | ICD-10-CM

## 2019-10-12 DIAGNOSIS — I5021 Acute systolic (congestive) heart failure: Secondary | ICD-10-CM

## 2019-10-12 DIAGNOSIS — I2510 Atherosclerotic heart disease of native coronary artery without angina pectoris: Secondary | ICD-10-CM

## 2019-10-12 DIAGNOSIS — I4891 Unspecified atrial fibrillation: Secondary | ICD-10-CM

## 2019-10-12 DIAGNOSIS — R778 Other specified abnormalities of plasma proteins: Secondary | ICD-10-CM

## 2019-10-12 DIAGNOSIS — I2583 Coronary atherosclerosis due to lipid rich plaque: Secondary | ICD-10-CM

## 2019-10-12 LAB — EXPECTORATED SPUTUM ASSESSMENT W GRAM STAIN, RFLX TO RESP C

## 2019-10-12 LAB — GLUCOSE, CAPILLARY
Glucose-Capillary: 279 mg/dL — ABNORMAL HIGH (ref 70–99)
Glucose-Capillary: 85 mg/dL (ref 70–99)
Glucose-Capillary: 172 mg/dL — ABNORMAL HIGH (ref 70–99)
Glucose-Capillary: 182 mg/dL — ABNORMAL HIGH (ref 70–99)

## 2019-10-12 LAB — ECHOCARDIOGRAM COMPLETE
Height: 74 in
Weight: 4608 oz

## 2019-10-12 LAB — STREP PNEUMONIAE URINARY ANTIGEN: Strep Pneumo Urinary Antigen: NEGATIVE

## 2019-10-12 LAB — HEMOGLOBIN A1C
Hgb A1c MFr Bld: 11 % — ABNORMAL HIGH (ref 4.8–5.6)
Mean Plasma Glucose: 269 mg/dL

## 2019-10-12 LAB — BASIC METABOLIC PANEL
Anion gap: 11 (ref 5–15)
BUN: 15 mg/dL (ref 6–20)
CO2: 24 mmol/L (ref 22–32)
Calcium: 8.7 mg/dL — ABNORMAL LOW (ref 8.9–10.3)
Chloride: 104 mmol/L (ref 98–111)
Creatinine, Ser: 0.99 mg/dL (ref 0.61–1.24)
GFR calc Af Amer: >60 mL/min (ref >60)
GFR calc non Af Amer: >60 mL/min (ref >60)
Glucose, Bld: 315 mg/dL — ABNORMAL HIGH (ref 70–99)
Potassium: 4 mmol/L (ref 3.5–5.1)
Sodium: 139 mmol/L (ref 135–145)

## 2019-10-12 LAB — SARS CORONAVIRUS 2 (TAT 6-24 HRS): SARS Coronavirus 2: NEGATIVE

## 2019-10-12 LAB — TROPONIN I (HIGH SENSITIVITY)
Troponin I (High Sensitivity): 51 ng/L — ABNORMAL HIGH (ref <18)
Troponin I (High Sensitivity): 54 ng/L — ABNORMAL HIGH (ref <18)

## 2019-10-12 LAB — MAGNESIUM: Magnesium: 2 mg/dL (ref 1.7–2.4)

## 2019-10-12 LAB — TSH: TSH: 1.741 u[IU]/mL (ref 0.350–4.500)

## 2019-10-12 LAB — PHOSPHORUS: Phosphorus: 3.7 mg/dL (ref 2.5–4.6)

## 2019-10-12 MED ORDER — SODIUM CHLORIDE 0.9 % IV SOLN: 250.0000 mL | INTRAVENOUS | Status: DC | PRN

## 2019-10-12 MED ORDER — HEPARIN (PORCINE) 25000 UT/250ML-% IV SOLN
2000.0000 [IU]/h | INTRAVENOUS | Status: DC
  Administered 2019-10-12: 1550 [IU]/h via INTRAVENOUS
  Administered 2019-10-13: 1750 [IU]/h via INTRAVENOUS
  Administered 2019-10-14: 02:00:00 2000 [IU]/h via INTRAVENOUS
  Filled 2019-10-12 (×3): qty 250

## 2019-10-12 MED ORDER — GUAIFENESIN 100 MG/5ML PO SOLN
10.0000 mL | ORAL | Status: DC | PRN
  Administered 2019-10-12 – 2019-10-15 (×4): 200 mg via ORAL
  Filled 2019-10-12 (×6): qty 5

## 2019-10-12 MED ORDER — PNEUMOCOCCAL VAC POLYVALENT 25 MCG/0.5ML IJ INJ
0.5000 mL | INJECTION | INTRAMUSCULAR | Status: AC
  Administered 2019-10-16: 10:00:00 0.5 mL via INTRAMUSCULAR
  Filled 2019-10-12: qty 0.5

## 2019-10-12 MED ORDER — HEPARIN BOLUS VIA INFUSION
5500.0000 [IU] | Freq: Once | INTRAVENOUS | Status: DC
  Filled 2019-10-12: qty 5500

## 2019-10-12 MED ORDER — PANTOPRAZOLE SODIUM 40 MG PO TBEC
40.0000 mg | DELAYED_RELEASE_TABLET | Freq: Every day | ORAL | Status: DC
  Administered 2019-10-12 – 2019-10-16 (×5): 40 mg via ORAL
  Filled 2019-10-12 (×5): qty 1

## 2019-10-12 MED ORDER — ONDANSETRON HCL 4 MG/2ML IJ SOLN: 4.0000 mg | Freq: Four times a day (QID) | INTRAMUSCULAR | Status: DC | PRN

## 2019-10-12 MED ORDER — PERFLUTREN LIPID MICROSPHERE
1.0000 mL | INTRAVENOUS | Status: AC | PRN
  Administered 2019-10-12: 2 mL via INTRAVENOUS
  Filled 2019-10-12: qty 10

## 2019-10-12 MED ORDER — INSULIN ASPART 100 UNIT/ML ~~LOC~~ SOLN
6.0000 [IU] | Freq: Three times a day (TID) | SUBCUTANEOUS | Status: DC
  Administered 2019-10-12 – 2019-10-13 (×3): 6 [IU] via SUBCUTANEOUS

## 2019-10-12 MED ORDER — SODIUM CHLORIDE 0.9% FLUSH: 3.0000 mL | INTRAVENOUS | Status: DC | PRN

## 2019-10-12 MED ORDER — ASPIRIN 81 MG PO CHEW
81.0000 mg | CHEWABLE_TABLET | Freq: Every day | ORAL | Status: DC
  Administered 2019-10-12 – 2019-10-16 (×5): 81 mg via ORAL
  Filled 2019-10-12 (×7): qty 1

## 2019-10-12 MED ORDER — INSULIN ASPART PROT & ASPART (70-30 MIX) 100 UNIT/ML ~~LOC~~ SUSP
10.0000 [IU] | Freq: Two times a day (BID) | SUBCUTANEOUS | Status: DC
  Administered 2019-10-12 – 2019-10-13 (×2): 10 [IU] via SUBCUTANEOUS
  Filled 2019-10-12 (×3): qty 10

## 2019-10-12 MED ORDER — QUETIAPINE FUMARATE 25 MG PO TABS: 50.0000 mg | ORAL_TABLET | ORAL | Status: DC

## 2019-10-12 MED ORDER — SODIUM CHLORIDE 0.9 % IV SOLN
500.0000 mg | Freq: Every day | INTRAVENOUS | Status: DC
  Administered 2019-10-12 – 2019-10-15 (×4): 500 mg via INTRAVENOUS
  Filled 2019-10-12 (×5): qty 500

## 2019-10-12 MED ORDER — ATORVASTATIN CALCIUM 40 MG PO TABS
40.0000 mg | ORAL_TABLET | Freq: Every day | ORAL | Status: DC
  Administered 2019-10-12 – 2019-10-15 (×4): 40 mg via ORAL
  Filled 2019-10-12 (×4): qty 1

## 2019-10-12 MED ORDER — SODIUM CHLORIDE 0.9% FLUSH
3.0000 mL | Freq: Two times a day (BID) | INTRAVENOUS | Status: DC
  Administered 2019-10-12 – 2019-10-16 (×7): 3 mL via INTRAVENOUS

## 2019-10-12 MED ORDER — METOPROLOL TARTRATE 25 MG PO TABS
25.0000 mg | ORAL_TABLET | Freq: Three times a day (TID) | ORAL | Status: DC
  Administered 2019-10-12 – 2019-10-13 (×3): 25 mg via ORAL
  Filled 2019-10-12 (×3): qty 1

## 2019-10-12 MED ORDER — QUETIAPINE FUMARATE 50 MG PO TABS
50.0000 mg | ORAL_TABLET | Freq: Every day | ORAL | Status: DC
  Administered 2019-10-12 – 2019-10-15 (×4): 50 mg via ORAL
  Filled 2019-10-12 (×2): qty 1
  Filled 2019-10-12 (×2): qty 2

## 2019-10-12 MED ORDER — SODIUM CHLORIDE 0.9 % IV SOLN
INTRAVENOUS | Status: DC | PRN
  Administered 2019-10-12 – 2019-10-13 (×3): 1000 mL via INTRAVENOUS

## 2019-10-12 MED ORDER — IPRATROPIUM-ALBUTEROL 0.5-2.5 (3) MG/3ML IN SOLN: 3.0000 mL | Freq: Four times a day (QID) | RESPIRATORY_TRACT | Status: DC | PRN

## 2019-10-12 MED ORDER — SODIUM CHLORIDE 0.9 % IV SOLN
2.0000 g | INTRAVENOUS | Status: DC
  Administered 2019-10-12 – 2019-10-14 (×3): 2 g via INTRAVENOUS
  Filled 2019-10-12: qty 20
  Filled 2019-10-12: qty 2
  Filled 2019-10-12 (×2): qty 20

## 2019-10-12 MED ORDER — INFLUENZA VAC SPLIT QUAD 0.5 ML IM SUSY
0.5000 mL | PREFILLED_SYRINGE | INTRAMUSCULAR | Status: AC
  Administered 2019-10-13: 09:00:00 0.5 mL via INTRAMUSCULAR
  Filled 2019-10-12: qty 0.5

## 2019-10-12 MED ORDER — INSULIN ASPART 100 UNIT/ML ~~LOC~~ SOLN
0.0000 [IU] | Freq: Three times a day (TID) | SUBCUTANEOUS | Status: DC
  Administered 2019-10-12: 8 [IU] via SUBCUTANEOUS
  Administered 2019-10-12 – 2019-10-13 (×3): 3 [IU] via SUBCUTANEOUS
  Administered 2019-10-13 – 2019-10-15 (×3): 2 [IU] via SUBCUTANEOUS
  Administered 2019-10-15 – 2019-10-16 (×2): 3 [IU] via SUBCUTANEOUS
  Administered 2019-10-16: 12:00:00 5 [IU] via SUBCUTANEOUS

## 2019-10-12 MED ORDER — ACETAMINOPHEN 325 MG PO TABS
650.0000 mg | ORAL_TABLET | ORAL | Status: DC | PRN
  Administered 2019-10-12 – 2019-10-13 (×2): 650 mg via ORAL
  Filled 2019-10-12 (×2): qty 2

## 2019-10-12 NOTE — Progress Notes (Addendum)
[place] for heparin Indication: atrial fibrillation  No Known Allergies  Patient Measurements: Height: 6\' 2"  (188 cm) Weight: 288 lb (130.6 kg)(scale b) IBW/kg (Calculated) : 82.2 Heparin Dosing Weight: 111 kg  Vital Signs: Temp: 98.1 F (36.7 C) (11/15 0700) Temp Source: Oral (11/15 0700) BP: 154/110 (11/15 0700) Pulse Rate: 92 (11/15 0700)  Labs: Recent Labs    10/11/19 1603 10/11/19 1841 10/12/19 0351 10/12/19 0545  HGB 12.5*  --   --   --   HCT 41.5  --   --   --   PLT 317  --   --   --   CREATININE 0.83  --   --  0.99  TROPONINIHS 74* 68* 51* 54*    Estimated Creatinine Clearance: 115.5 mL/min (by C-G formula based on SCr of 0.99 mg/dL).   Medical History: Past Medical History:  Diagnosis Date  . Anxiety   . CAD (coronary artery disease)   . Depression   . Diabetes mellitus   . Hypertension   . Schizophrenia ([place])     Medications:  Scheduled:  . aspirin  81 mg Oral Daily  . atorvastatin  40 mg Oral q1800  . furosemide  40 mg Intravenous Q12H  . [START ON 10/13/2019] influenza vac split quadrivalent PF  0.5 mL Intramuscular Tomorrow-1000  . insulin aspart  0-15 Units Subcutaneous TID WC  . insulin aspart  6 Units Subcutaneous TID WC  . metoprolol tartrate  25 mg Oral BID  . pantoprazole  40 mg Oral Daily  . [START ON 10/13/2019] pneumococcal 23 valent vaccine  0.5 mL Intramuscular Tomorrow-1000  . QUEtiapine  50-300 mg Oral See admin instructions  . sodium chloride flush  3 mL Intravenous Q12H    Assessment: Patient is a 8 yom admitted for SOB and was on Eliquis for afib present on admission (CHADs2VASC score is 3 (CAD/HTN/DM)). Last dose of Eliquis was 11/15 at 0817.   CBC on 11/14 shows a H&H of 12.5/41.5 and wnl plts.   Goal of Therapy:  Heparin level 0.3-0.7 units/ml aPTT 66-102 seconds Monitor platelets by anticoagulation protocol: Yes   Plan:  Start heparin infusion 12 hours after last dose of  Eliquis (2030 on 11/15) Start heparin infusion at 1550 units/hr Check anti-Xa level and aPTT with morning labs and then daily while on heparin Continue to monitor H&H and platelets   Thank you,   Eddie Candle, PharmD PGY-1 Pharmacy Resident   Please check amion for clinical pharmacist contact number 10/12/2019,11:07 AM

## 2019-10-12 NOTE — Progress Notes (Signed)
  Echocardiogram 2D Echocardiogram has been performed.  [name] [name] 10/12/2019, 9:31 AM

## 2019-10-12 NOTE — Progress Notes (Addendum)
Progress Note  Patient Name: [name] Date of Encounter: 10/12/2019  Primary Cardiologist: No primary care provider on file.   Subjective   Admitted with 1 month hx of SOB.  Possible PNA complicated by CHF and elevated tropl  Inpatient Medications    Scheduled Meds: . apixaban  5 mg Oral BID  . aspirin  81 mg Oral Daily  . atorvastatin  40 mg Oral q1800  . furosemide  40 mg Intravenous Q12H  . [START ON 10/13/2019] influenza vac split quadrivalent PF  0.5 mL Intramuscular Tomorrow-1000  . insulin aspart  0-15 Units Subcutaneous TID WC  . insulin aspart  6 Units Subcutaneous TID WC  . metoprolol tartrate  25 mg Oral BID  . pantoprazole  40 mg Oral Daily  . [START ON 10/13/2019] pneumococcal 23 valent vaccine  0.5 mL Intramuscular Tomorrow-1000  . QUEtiapine  50-300 mg Oral See admin instructions  . sodium chloride flush  3 mL Intravenous Q12H   Continuous Infusions: . sodium chloride    . sodium chloride Stopped (10/12/19 0348)  . azithromycin Stopped (10/12/19 0348)  . cefTRIAXone (ROCEPHIN)  IV     PRN Meds: sodium chloride, sodium chloride, acetaminophen, ipratropium-albuterol, nitroGLYCERIN, ondansetron (ZOFRAN) IV, perflutren lipid microspheres (DEFINITY) IV suspension, sodium chloride flush   Vital Signs    Vitals:   10/11/19 2315 10/12/19 0011 10/12/19 0325 10/12/19 0700  BP: 132/78 (!) 147/106 (!) 138/102 (!) 154/110  Pulse: 82 (!) 114 87 92  Resp: 20 (!) 22 18 18   Temp:  97.8 F (36.6 C) 98 F (36.7 C) 98.1 F (36.7 C)  TempSrc:  Oral Oral Oral  SpO2: 96% 93% 93% 94%  Weight:  130.6 kg    Height:  6\' 2"  (1.88 m)      Intake/Output Summary (Last 24 hours) at 10/12/2019 1036 Last data filed at 10/12/2019 0830 Gross per 24 hour  Intake 1073.77 ml  Output 1300 ml  Net -226.23 ml   Filed Weights   10/11/19 1600 10/12/19 0011  Weight: 133.8 kg 130.6 kg    Telemetry    NSR - Personally Reviewed  ECG    ST with anterior infarct,  nonspecific ST abnormality and LVH by voltage criteria - Personally Reviewed  Physical Exam   GEN: No acute distress.   Neck: No JVD Cardiac: RRR, no murmurs, rubs, or gallops.  Respiratory: crackles at right base GI: Soft, nontender, non-distended  MS: 1+ LE edema; No deformity. Neuro:  Nonfocal  Psych: Normal affect   Labs    Chemistry Recent Labs  Lab 10/11/19 1603 10/12/19 0545  NA 136 139  K 3.2* 4.0  CL 103 104  CO2 22 24  GLUCOSE 239* 315*  BUN 8 15  CREATININE 0.83 0.99  CALCIUM 8.4* 8.7*  GFRNONAA >60 >60  GFRAA >60 >60  ANIONGAP 11 11     Hematology Recent Labs  Lab 10/11/19 1603  WBC 10.5  RBC 4.86  HGB 12.5*  HCT 41.5  MCV 85.4  MCH 25.7*  MCHC 30.1  RDW 15.0  PLT 317    Cardiac EnzymesNo results for input(s): TROPONINI in the last 168 hours. No results for input(s): TROPIPOC in the last 168 hours.   BNP Recent Labs  Lab 10/11/19 1603  BNP 1,202.2*     DDimer No results for input(s): DDIMER in the last 168 hours.   Radiology    Ct Angio Chest Pe W And/or Wo Contrast  Result Date: 10/11/2019 CLINICAL DATA:  Chest pain. EXAM: CT ANGIOGRAPHY CHEST WITH CONTRAST TECHNIQUE: Multidetector CT imaging of the chest was performed using the standard protocol during bolus administration of intravenous contrast. Multiplanar CT image reconstructions and MIPs were obtained to evaluate the vascular anatomy. CONTRAST:  143mL OMNIPAQUE IOHEXOL 350 MG/ML SOLN COMPARISON:  Chest x-ray 10/11/2019 FINDINGS: Cardiovascular: Cardiovascular heart size is enlarged with no signs of pericardial effusion. Calcified coronary artery disease. Aortic caliber is normal. No signs of pulmonary embolism. Mediastinum/Nodes: Enlarged lymph nodes throughout the mediastinum. No axillary lymphadenopathy. No supraclavicular adenopathy. (Image 35, series 8) 2.5 cm right paratracheal lymph node. (Image 49, series 8): 1.8 cm lymph node in the subcarinal region along right lower lobe  bronchus. Smaller lymph nodes scattered about the chest including in the AP window. No anterior mediastinal mass or adenopathy. Lungs/Pleura: Moderate right and small left pleural effusion layering dependently with interstitial nodular and ground-glass opacities in both right and left chest worse in the right upper lobe. Basilar airspace disease and volume loss. Narrowing of the bilateral mainstem bronchi. Upper Abdomen: No acute findings in the upper abdomen. Musculoskeletal: No acute bone finding or destructive bone process. Review of the MIP images confirms the above findings. IMPRESSION: 1. No pulmonary embolism. 2. Signs of heart failure likely with superimposed pneumonia. 3. Narrowing of mainstem bronchi distal trachea may be due to bronchomalacia or may be physiologic in the setting of edema and pneumonia. 4. Mild aortic atherosclerosis with signs of extensive coronary artery disease. 5. Mediastinal nodal enlargement may be reactive. Consider follow-up after resolution of acute symptoms to ensure resolution. Aortic Atherosclerosis (ICD10-I70.0). Electronically Signed   By: Zetta Bills M.D.   On: 10/11/2019 19:55   Dg Chest Port 1 View  Result Date: 10/11/2019 CLINICAL DATA:  Shortness of breath and chest pain EXAM: PORTABLE CHEST 1 VIEW COMPARISON:  09/23/2011 FINDINGS: The left lung is grossly clear. Ill-defined somewhat nodular airspace disease in the right upper lobe. Borderline cardiomegaly. No significant pleural effusion. No pneumothorax. Appearance of pleural deformity at the right apex. IMPRESSION: 1. Ill-defined somewhat nodular appearing opacity in the right upper lobe which could be secondary to pneumonia versus lung nodules. Further evaluation with chest CT could be obtained. Electronically Signed   By: Donavan Foil M.D.   On: 10/11/2019 16:45    Cardiac Studies   None  Patient Profile     58 y.o. male with a hx of CAD s/p PCI to OM in 2012, DM, tobacco use who is being seen for  the evaluation of dyspnea.  Assessment & Plan    1.  SOB -possibly multifactorial from PNA as well as acute CHF  -has had SOB and intermittent CP for 2 months and recent cough with night sweats -sx worse with exertion -low grade fever in ER at 99.2 and hypertensive with BP 150/154mmHg -EKG with ST with frequent PACs -2D echo pending -given IV Lasix for CHF -started on antibx for PNA  2.  ? New onset atrial fibrillation -currently in NSR -I have reviewed all tele strips and EKGs.  I cannot find any evidence of atrial fibrillation.  There is NSR to ST with frequent PACs and possible MAT -he was already started on Eliquis for CHADs2VASC score is 3 (CAD/HTN/DM)  -I will stop Eliquis and start IV Heparin gtt for now until echo back given elevated trop and CP -2D echo pending -could consider outpt event monitor to rule out silent afib  3.  Acute CHF -2D echo pending -BNP elevated at 1202  but no edema on cxray -started on IV Lasix yesterday -put out 1.3L and is net neg 226cc. -weight down 7lbs from admit -creatinine stable at 0.99 -continue Lasix 40mg  IV BID -follow I&Os, daily weights and renal function closely  4.  HTN  -BP poorly controlled -continue Lopressor 25mg  BID -he was on losartan and amlodipine outpt which have been held -restart Losartan -home dose  5.  PNA -per TRH  6.  ASCAD -s/p remote PCI to OM in 2012 -trop mildly elevated at 74>68?51?54 -likely related to demand ischemia from CHF, PNA  -2D echo pending -if LVF normal consider Lexi myoview given his recent intermittent CP.    I have spent a total of 35 minutes with patient reviewing hospital notes , telemetry, EKGs, labs and examining patient as well as establishing an assessment and plan that was discussed with the patient.  > 50% of time was spent in direct patient care.      For questions or updates, please contact [place] Please consult [email or web address] for contact info under Cardiology/STEMI.       Signed, [name], MD  10/12/2019, 10:36 AM

## 2019-10-12 NOTE — Progress Notes (Addendum)
Pt had Vtach for 14 seconds. Notified Cardiology PA.

## 2019-10-12 NOTE — H&P (Signed)
History and Physical    [name] [name] DOB: 1961-05-09 DOA: 10/11/2019  PCP: Nolene Ebbs, MD  Patient coming from: Home  I have personally briefly reviewed patient's old medical records in [place]  Chief Complaint: Shortness of breath and chest pain  HPI: [name] is a 58 y.o. male with medical history significant of  CAD status post PCI 2012 diabetes hypertension schizophrenia noncompliance with medication as well as follow-up.  Patient also has social history incarceration over the last 4 years he was recently released 7 months ago.  He states since then he has had difficulty with obtaining his medications and has been noncompliant with his cardiac meds.  He states his symptoms started around ago 2 mo ago where he noted increasing shortness of breath and intermittent chest pain.  He says over the last 2 weeks however symptoms have gotten worse he is noted increasing cough which is somewhat productive initially thought he had a URI and has been taking over-the-counter medications for this however without any improvement in his symptoms.  Patient stated that his shortness of breath at times is made worse with exertion. He has also noted within the last 2weeks productive cough as well as night sweats.  Patient states due to progression of his shortness of breath and chest pain he called EMS.  Per EMS on arrival noted that patient saturations were 81% patient was placed on a nonrebreather with some relief he was also given albuterol as well as Atrovent as well as Solu-Medrol and magnesium.   ED Course:  On arrival to ED patient temp was 99.2 respiratory rate was 18 blood pressure was 150/102 pulse 80.  EKG was noted to show atrial fibrillation at a rate of 110 nonspecific T wave changes.  Again noted signs of heart failure with superimposed pneumonia.  Patient was treated with IV diuretics as well as antibiotics in the ED.  Patient symptoms stabilized with  this treatment.  Of note cardiology was also consulted due to elevated troponins.-Troponin noted to be 74.  BNP 1200. cardiology seen patient in ED and made recommendations for starting patient on Eliquis for new onset A. fib as well as rate control with beta-blockers.  At the time of evaluation cardiology was not concerned with ACS and thought that elevated troponins was more likely related to demand ischemia from heart failure.  Review of Systems: As per HPI otherwise 10 point review of systems negative.   Past Medical History:  Diagnosis Date   Anxiety    CAD (coronary artery disease)    Depression    Diabetes mellitus    Hypertension    Schizophrenia ([place])     Past Surgical History:  Procedure Laterality Date   CORONARY STENT PLACEMENT     HERNIA REPAIR       reports that he has never smoked. He has never used smokeless tobacco. He reports that he does not drink alcohol or use drugs.  No Known Allergies  Family History  Problem Relation Age of Onset   Heart failure Mother    Mental illness Sister    Mental illness Sister     Prior to Admission medications   Medication Sig Start Date End Date Taking? Authorizing Provider  amLODipine (NORVASC) 10 MG tablet Take 10 mg by mouth daily.    [provider]  aspirin 81 MG tablet Take 81 mg by mouth daily.    [provider]  Canagliflozin-Metformin HCl (INVOKAMET PO) Take by mouth.  [provider]  CYCLOBENZAPRINE HCL PO Take by mouth.    [provider]  escitalopram (LEXAPRO) 20 MG tablet Take 1 tablet (20 mg total) by mouth daily. 04/01/15 03/31/16  Charlcie Cradle, MD  GABAPENTIN PO Take by mouth.    [provider]  LOSARTAN POTASSIUM PO Take by mouth.    [provider]  Nitroglycerin (NITROSTAT SL) Place under the tongue.    [provider]  omeprazole (PRILOSEC) 20 MG capsule Take 20 mg by mouth daily.    [provider]  penicillin v  potassium (VEETID) 500 MG tablet Take 500 mg by mouth 4 (four) times daily.    [provider]  QUEtiapine (SEROQUEL) 100 MG tablet Take 50-300 mg by mouth See admin instructions. Take 1/2 tablet (50 mg) by mouth at bedtime on 1st night, then titrate up to 300 mg daily at bedtime as directed    [provider]  QUEtiapine (SEROQUEL) 200 MG tablet Take 1.5 tablets (300 mg total) by mouth at bedtime. Patient not taking: Reported on 10/11/2019 04/01/15   Charlcie Cradle, MD  saxagliptin HCl (ONGLYZA) 2.5 MG TABS tablet Take 2.5 mg by mouth daily.    [provider]  SIMVASTATIN PO Take by mouth.    [provider]  triamcinolone cream (KENALOG) 0.5 % Apply 1 application topically 3 (three) times daily.    [provider]  zolpidem (AMBIEN) 10 MG tablet Take 1 tablet (10 mg total) by mouth at bedtime as needed for sleep. Patient taking differently: Take 10 mg by mouth at bedtime.  04/01/15   Charlcie Cradle, MD    Physical Exam: Vitals:   10/11/19 2245 10/11/19 2300 10/11/19 2315 10/12/19 0011  BP: (!) 133/94 (!) 132/95 132/78 (!) 147/106  Pulse: (!) 101 86 82 (!) 114  Resp: 20 20 20  (!) 22  Temp:    97.8 F (36.6 C)  TempSrc:    Oral  SpO2: 95% 96% 96% 93%  Weight:    130.6 kg  Height:    6\' 2"  (1.88 m)    Constitutional: NAD, calm, comfortable Vitals:   10/11/19 2245 10/11/19 2300 10/11/19 2315 10/12/19 0011  BP: (!) 133/94 (!) 132/95 132/78 (!) 147/106  Pulse: (!) 101 86 82 (!) 114  Resp: 20 20 20  (!) 22  Temp:    97.8 F (36.6 C)  TempSrc:    Oral  SpO2: 95% 96% 96% 93%  Weight:    130.6 kg  Height:    6\' 2"  (1.88 m)   Eyes: PERRL, lids and conjunctivae normal ENMT: Mucous membranes are moist. Posterior pharynx clear of any exudate or lesions.Normal dentition.  Neck: normal, supple, no masses, no thyromegaly Respiratory: clear to auscultation bilaterally, rare wheezing, no crackles/decrease at bases.  Normal respiratory effort. No  accessory muscle use.  Cardiovascular: Regular rate and rhythm, no murmurs / rubs / gallops. No extremity edema. 2+pedal pulses. No carotid bruits.  Abdomen: no tenderness, no masses palpated. No hepatosplenomegaly. Bowel sounds positive.  Musculoskeletal: no clubbing / cyanosis. No joint deformity upper and lower extremities. Good ROM, no contractures. Normal muscle tone.  Skin: no rashes, lesions, ulcers. No induration Neurologic: CN 2-12 grossly intact. Sensation intact, DTR normal. Strength 5/5 in all 4.  Psychiatric: Normal judgment and insight. Alert and oriented x 3. Normal mood.    Labs on Admission: I have personally reviewed following labs and imaging studies  CBC: Recent Labs  Lab 10/11/19 1603  WBC 10.5  NEUTROABS 8.7*  HGB 12.5*  HCT 41.5  MCV 85.4  PLT [id number]   Basic Metabolic Panel: Recent Labs  Lab 10/11/19 1603  NA 136  K 3.2*  CL 103  CO2 22  GLUCOSE 239*  BUN 8  CREATININE 0.83  CALCIUM 8.4*   GFR: Estimated Creatinine Clearance: 137.7 mL/min (by C-G formula based on SCr of 0.83 mg/dL). Liver Function Tests: No results for input(s): AST, ALT, ALKPHOS, BILITOT, PROT, ALBUMIN in the last 168 hours. No results for input(s): LIPASE, AMYLASE in the last 168 hours. No results for input(s): AMMONIA in the last 168 hours. Coagulation Profile: No results for input(s): INR, PROTIME in the last 168 hours. Cardiac Enzymes: No results for input(s): CKTOTAL, CKMB, CKMBINDEX, TROPONINI in the last 168 hours. BNP (last 3 results) No results for input(s): PROBNP in the last 8760 hours. HbA1C: No results for input(s): HGBA1C in the last 72 hours. CBG: No results for input(s): GLUCAP in the last 168 hours. Lipid Profile: No results for input(s): CHOL, HDL, LDLCALC, TRIG, CHOLHDL, LDLDIRECT in the last 72 hours. Thyroid Function Tests: No results for input(s): TSH, T4TOTAL, FREET4, T3FREE, THYROIDAB in the last 72 hours. Anemia Panel: No results for input(s):  VITAMINB12, FOLATE, FERRITIN, TIBC, IRON, RETICCTPCT in the last 72 hours. Urine analysis:    Component Value Date/Time   COLORURINE AMBER BIOCHEMICALS MAY BE AFFECTED BY COLOR (A) 06/10/2010 2207   APPEARANCEUR CLEAR 06/10/2010 2207   LABSPEC 1.039 (H) 06/10/2010 2207   PHURINE 5.5 06/10/2010 2207   GLUCOSEU NEGATIVE 06/10/2010 2207   HGBUR NEGATIVE 06/10/2010 2207   BILIRUBINUR SMALL (A) 06/10/2010 2207   KETONESUR 15 (A) 06/10/2010 2207   PROTEINUR NEGATIVE 06/10/2010 2207   UROBILINOGEN 1.0 06/10/2010 2207   NITRITE NEGATIVE 06/10/2010 2207   LEUKOCYTESUR  06/10/2010 2207    NEGATIVE MICROSCOPIC NOT DONE ON URINES WITH NEGATIVE PROTEIN, BLOOD, LEUKOCYTES, NITRITE, OR GLUCOSE <1000 mg/dL.    Radiological Exams on Admission: Ct Angio Chest Pe W And/or Wo Contrast  Result Date: 10/11/2019 CLINICAL DATA:  Chest pain. EXAM: CT ANGIOGRAPHY CHEST WITH CONTRAST TECHNIQUE: Multidetector CT imaging of the chest was performed using the standard protocol during bolus administration of intravenous contrast. Multiplanar CT image reconstructions and MIPs were obtained to evaluate the vascular anatomy. CONTRAST:  135mL OMNIPAQUE IOHEXOL 350 MG/ML SOLN COMPARISON:  Chest x-ray 10/11/2019 FINDINGS: Cardiovascular: Cardiovascular heart size is enlarged with no signs of pericardial effusion. Calcified coronary artery disease. Aortic caliber is normal. No signs of pulmonary embolism. Mediastinum/Nodes: Enlarged lymph nodes throughout the mediastinum. No axillary lymphadenopathy. No supraclavicular adenopathy. (Image 35, series 8) 2.5 cm right paratracheal lymph node. (Image 49, series 8): 1.8 cm lymph node in the subcarinal region along right lower lobe bronchus. Smaller lymph nodes scattered about the chest including in the AP window. No anterior mediastinal mass or adenopathy. Lungs/Pleura: Moderate right and small left pleural effusion layering dependently with interstitial nodular and ground-glass  opacities in both right and left chest worse in the right upper lobe. Basilar airspace disease and volume loss. Narrowing of the bilateral mainstem bronchi. Upper Abdomen: No acute findings in the upper abdomen. Musculoskeletal: No acute bone finding or destructive bone process. Review of the MIP images confirms the above findings. IMPRESSION: 1. No pulmonary embolism. 2. Signs of heart failure likely with superimposed pneumonia. 3. Narrowing of mainstem bronchi distal trachea may be due to bronchomalacia or may be physiologic in the setting of edema and pneumonia. 4. Mild aortic atherosclerosis with signs of extensive coronary  artery disease. 5. Mediastinal nodal enlargement may be reactive. Consider follow-up after resolution of acute symptoms to ensure resolution. Aortic Atherosclerosis (ICD10-I70.0). Electronically Signed   By: Zetta Bills M.D.   On: 10/11/2019 19:55   Dg Chest Port 1 View  Result Date: 10/11/2019 CLINICAL DATA:  Shortness of breath and chest pain EXAM: PORTABLE CHEST 1 VIEW COMPARISON:  09/23/2011 FINDINGS: The left lung is grossly clear. Ill-defined somewhat nodular airspace disease in the right upper lobe. Borderline cardiomegaly. No significant pleural effusion. No pneumothorax. Appearance of pleural deformity at the right apex. IMPRESSION: 1. Ill-defined somewhat nodular appearing opacity in the right upper lobe which could be secondary to pneumonia versus lung nodules. Further evaluation with chest CT could be obtained. Electronically Signed   By: Donavan Foil M.D.   On: 10/11/2019 16:45    EKG: Independently reviewed.  afib 110 , nonspecific t wave changes   Assessment/Plan Acute CHF exacerbation EF around 50% -IV Lasix twice daily -CHF protocol strict I's and O's /Daily weights/monitor on telemetry -Follow-up on cardiology recommendations -Cardiogram ordered for a.m.  New onset A. Fib -Apixaban twice daily -Metoprolol 25 twice daily as per cardiology  recs -Cardiology may consider cardioversion in the future  Limited troponin due to demand ischemia -Continue to cycle troponins -With echocardiogram in a.m. -We will repeat EKG in the a.m. -Check lipid panel -Statin  Community-acquired pneumonia -Placed on broad-spectrum antibiotics -Follow-up on sputum culture -Pulmonary toilet  Hyperlipidemia Continue with statin medication  Diabetes II -Based on finger sticks, and insulin sliding scale -Check A1c in the a.m.  Depression/psych disorder NOS -Resume outpatient medications   Mild hypokalemia Replete as needed  DVT prophylaxis: Apixaban Code Status: Full code  Family Communication: Not applicable Disposition Plan: Patient expected to be admitted greater than 2 midnights  consults called: Cardiology has been consulted consult note on chart Dr. Charissa Bash Admission status: Inpatient   Clance Boll MD [facility] Pager [phone]  If 7PM-7AM, please contact night-coverage [email or web address] Password [email or web address]  10/12/2019, 1:21 AM

## 2019-10-12 NOTE — Progress Notes (Signed)
PROGRESS NOTE    [name]  [name] DOB: [birth year] DOA: 10/11/2019 PCP: Joel Ebbs, MD    Brief Narrative:  58 year old gentleman with history of coronary artery disease status post PCI in 2012, type 2 diabetes, hypertension, schizophrenia, noncompliance issues and currently without maintenance health care admitted to the hospital with increasing shortness of breath and intermittent chest pain for more than a month.  According to the patient he has not taken any medication for last 7 months at least.  In the emergency room he was found with hypoxic 81% on walking, leg edema, proBNP more than thousand.  Chest x-ray also suggestive of possible pneumonia.  Admitted with cardiology consultation. Suspected to have A. fib.   Assessment & Plan:   Active Problems:   CHF (congestive heart failure) (HCC)  Acute CHF: Type unknown probably combined heart failure: Echo pending.  Good response to IV diuresis.  Continue IV diuresis.  Intake output monitoring.  Followed by cardiology.  ?  New onset A. fib: Probably atrial tachycardia.  Now sinus rhythm.  Was on Eliquis, stopped, started heparin.  Echocardiogram pending.  Rate controlled.  Elevated troponin: Mild elevated troponin probably due to tachycardia. May need ischemic evaluation in the future.  Community acquired pneumonia: stabilizing. Cultures negative so far.  Continue chest physiotherapy.  Currently on Rocephin and azithromycin.we will continue.  Hyperlipidemia: On a statin.  Type 2 diabetes with hyperglycemia: Currently untreated.  A1c 11.  Previously on Metformin.  Patient will probably need insulin on discharge.  Will keep on insulin and optimize dose before discharge.  Hypertension: Blood pressures are stable.   DVT prophylaxis: Heparin drip Code Status: Full code Family Communication: None Disposition Plan: Home after hospitalization   Consultants:   Cardiology  Procedures:   None   Antimicrobials:   Rocephin, azithromycin, 10/11/2019---   Subjective: Patient seen and examined.  No overnight events.  Still on oxygen, denies any shortness of breath at rest.  Denies any chest pain.  No palpitations.  Telemetry shows sinus tachycardia.  Objective: Vitals:   10/11/19 2315 10/12/19 0011 10/12/19 0325 10/12/19 0700  BP: 132/78 (!) 147/106 (!) 138/102 (!) 154/110  Pulse: 82 (!) 114 87 92  Resp: 20 (!) 22 18 18   Temp:  97.8 F (36.6 C) 98 F (36.7 C) 98.1 F (36.7 C)  TempSrc:  Oral Oral Oral  SpO2: 96% 93% 93% 94%  Weight:  130.6 kg    Height:  6\' 2"  (1.88 m)      Intake/Output Summary (Last 24 hours) at 10/12/2019 1145 Last data filed at 10/12/2019 0830 Gross per 24 hour  Intake 1073.77 ml  Output 1300 ml  Net -226.23 ml   Filed Weights   10/11/19 1600 10/12/19 0011  Weight: 133.8 kg 130.6 kg    Examination:  General exam: Appears calm and comfortable, on 2 L oxygen. Respiratory system: Clear to auscultation. Respiratory effort normal.  No added sound. Cardiovascular system: S1 & S2 heard, RRR.  Tachycardic.  No JVD, murmurs, rubs, gallops or clicks.  1+ bilateral pedal edema.   Gastrointestinal system: Abdomen is nondistended, soft and nontender. No organomegaly or masses felt. Normal bowel sounds heard. Central nervous system: Alert and oriented. No focal neurological deficits. Extremities: Symmetric 5 x 5 power. Skin: No rashes, lesions or ulcers Psychiatry: Judgement and insight appear normal. Mood & affect appropriate.     Data Reviewed: I have personally reviewed following labs and imaging studies  CBC: Recent Labs  Lab 10/11/19 1603  WBC 10.5  NEUTROABS 8.7*  HGB 12.5*  HCT 41.5  MCV 85.4  PLT [id number]   Basic Metabolic Panel: Recent Labs  Lab 10/11/19 1603 10/12/19 0545  NA 136 139  K 3.2* 4.0  CL 103 104  CO2 22 24  GLUCOSE 239* 315*  BUN 8 15  CREATININE 0.83 0.99  CALCIUM 8.4* 8.7*  MG  --  2.0  PHOS  --  3.7   GFR:  Estimated Creatinine Clearance: 115.5 mL/min (by C-G formula based on SCr of 0.99 mg/dL). Liver Function Tests: No results for input(s): AST, ALT, ALKPHOS, BILITOT, PROT, ALBUMIN in the last 168 hours. No results for input(s): LIPASE, AMYLASE in the last 168 hours. No results for input(s): AMMONIA in the last 168 hours. Coagulation Profile: No results for input(s): INR, PROTIME in the last 168 hours. Cardiac Enzymes: No results for input(s): CKTOTAL, CKMB, CKMBINDEX, TROPONINI in the last 168 hours. BNP (last 3 results) No results for input(s): PROBNP in the last 8760 hours. HbA1C: Recent Labs    10/12/19 0351  HGBA1C 11.0*   CBG: Recent Labs  Lab 10/12/19 0615 10/12/19 1130  GLUCAP 279* 85   Lipid Profile: No results for input(s): CHOL, HDL, LDLCALC, TRIG, CHOLHDL, LDLDIRECT in the last 72 hours. Thyroid Function Tests: Recent Labs    10/12/19 1026  TSH 1.741   Anemia Panel: No results for input(s): VITAMINB12, FOLATE, FERRITIN, TIBC, IRON, RETICCTPCT in the last 72 hours. Sepsis Labs: No results for input(s): PROCALCITON, LATICACIDVEN in the last 168 hours.  Recent Results (from the past 240 hour(s))  SARS CORONAVIRUS 2 (TAT 6-24 HRS) Nasopharyngeal Nasopharyngeal Swab     Status: None   Collection Time: 10/11/19  6:00 PM   Specimen: Nasopharyngeal Swab  Result Value Ref Range Status   SARS Coronavirus 2 NEGATIVE NEGATIVE Final    Comment: (NOTE) SARS-CoV-2 target nucleic acids are NOT DETECTED. The SARS-CoV-2 RNA is generally detectable in upper and lower respiratory specimens during the acute phase of infection. Negative results do not preclude SARS-CoV-2 infection, do not rule out co-infections with other pathogens, and should not be used as the sole basis for treatment or other patient management decisions. Negative results must be combined with clinical observations, patient history, and epidemiological information. The expected result is Negative. Fact  Sheet for Patients: SugarRoll.be Fact Sheet for Healthcare Providers: https://www.woods-mathews.com/ This test is not yet approved or cleared by the [place] FDA and  has been authorized for detection and/or diagnosis of SARS-CoV-2 by FDA under an Emergency Use Authorization (EUA). This EUA will remain  in effect (meaning this test can be used) for the duration of the COVID-19 declaration under Section 56 4(b)(1) of the Act, 21 U.S.C. section 360bbb-3(b)(1), unless the authorization is terminated or revoked sooner. Performed at [facility], [place] [address], [place], [place] [address]          Radiology Studies: Ct Angio Chest Pe W And/or Wo Contrast  Result Date: 10/11/2019 CLINICAL DATA:  Chest pain. EXAM: CT ANGIOGRAPHY CHEST WITH CONTRAST TECHNIQUE: Multidetector CT imaging of the chest was performed using the standard protocol during bolus administration of intravenous contrast. Multiplanar CT image reconstructions and MIPs were obtained to evaluate the vascular anatomy. CONTRAST:  139mL OMNIPAQUE IOHEXOL 350 MG/ML SOLN COMPARISON:  Chest x-ray 10/11/2019 FINDINGS: Cardiovascular: Cardiovascular heart size is enlarged with no signs of pericardial effusion. Calcified coronary artery disease. Aortic caliber is normal. No signs of pulmonary embolism. Mediastinum/Nodes: Enlarged lymph nodes throughout the  mediastinum. No axillary lymphadenopathy. No supraclavicular adenopathy. (Image 35, series 8) 2.5 cm right paratracheal lymph node. (Image 49, series 8): 1.8 cm lymph node in the subcarinal region along right lower lobe bronchus. Smaller lymph nodes scattered about the chest including in the AP window. No anterior mediastinal mass or adenopathy. Lungs/Pleura: Moderate right and small left pleural effusion layering dependently with interstitial nodular and ground-glass opacities in both right and left chest worse in the right upper lobe.  Basilar airspace disease and volume loss. Narrowing of the bilateral mainstem bronchi. Upper Abdomen: No acute findings in the upper abdomen. Musculoskeletal: No acute bone finding or destructive bone process. Review of the MIP images confirms the above findings. IMPRESSION: 1. No pulmonary embolism. 2. Signs of heart failure likely with superimposed pneumonia. 3. Narrowing of mainstem bronchi distal trachea may be due to bronchomalacia or may be physiologic in the setting of edema and pneumonia. 4. Mild aortic atherosclerosis with signs of extensive coronary artery disease. 5. Mediastinal nodal enlargement may be reactive. Consider follow-up after resolution of acute symptoms to ensure resolution. Aortic Atherosclerosis (ICD10-I70.0). Electronically Signed   By: Zetta Bills M.D.   On: 10/11/2019 19:55   Dg Chest Port 1 View  Result Date: 10/11/2019 CLINICAL DATA:  Shortness of breath and chest pain EXAM: PORTABLE CHEST 1 VIEW COMPARISON:  09/23/2011 FINDINGS: The left lung is grossly clear. Ill-defined somewhat nodular airspace disease in the right upper lobe. Borderline cardiomegaly. No significant pleural effusion. No pneumothorax. Appearance of pleural deformity at the right apex. IMPRESSION: 1. Ill-defined somewhat nodular appearing opacity in the right upper lobe which could be secondary to pneumonia versus lung nodules. Further evaluation with chest CT could be obtained. Electronically Signed   By: Donavan Foil M.D.   On: 10/11/2019 16:45        Scheduled Meds: . aspirin  81 mg Oral Daily  . atorvastatin  40 mg Oral q1800  . furosemide  40 mg Intravenous Q12H  . heparin  5,500 Units Intravenous Once  . [START ON 10/13/2019] influenza vac split quadrivalent PF  0.5 mL Intramuscular Tomorrow-1000  . insulin aspart  0-15 Units Subcutaneous TID WC  . insulin aspart  6 Units Subcutaneous TID WC  . metoprolol tartrate  25 mg Oral BID  . pantoprazole  40 mg Oral Daily  . [START ON  10/13/2019] pneumococcal 23 valent vaccine  0.5 mL Intramuscular Tomorrow-1000  . QUEtiapine  50-300 mg Oral See admin instructions  . sodium chloride flush  3 mL Intravenous Q12H   Continuous Infusions: . sodium chloride    . sodium chloride Stopped (10/12/19 0348)  . azithromycin Stopped (10/12/19 0348)  . cefTRIAXone (ROCEPHIN)  IV    . heparin       LOS: 1 day    Time spent: 30 minutes    Barb Merino, MD [facility] Pager [phone]

## 2019-10-13 DIAGNOSIS — J189 Pneumonia, unspecified organism: Secondary | ICD-10-CM

## 2019-10-13 DIAGNOSIS — Z7901 Long term (current) use of anticoagulants: Secondary | ICD-10-CM

## 2019-10-13 LAB — CBC
HCT: 39.8 % (ref 39.0–52.0)
Hemoglobin: 12 g/dL — ABNORMAL LOW (ref 13.0–17.0)
MCH: 26.2 pg (ref 26.0–34.0)
MCHC: 30.2 g/dL (ref 30.0–36.0)
MCV: 86.9 fL (ref 80.0–100.0)
Platelets: 350 10*3/uL (ref 150–400)
RBC: 4.58 MIL/uL (ref 4.22–5.81)
RDW: 15.6 % — ABNORMAL HIGH (ref 11.5–15.5)
WBC: 8.7 10*3/uL (ref 4.0–10.5)
nRBC: 0 % (ref 0.0–0.2)

## 2019-10-13 LAB — APTT
aPTT: 30 seconds (ref 24–36)
aPTT: 34 seconds (ref 24–36)

## 2019-10-13 LAB — BASIC METABOLIC PANEL
Anion gap: 8 (ref 5–15)
BUN: 20 mg/dL (ref 6–20)
CO2: 29 mmol/L (ref 22–32)
Calcium: 8.7 mg/dL — ABNORMAL LOW (ref 8.9–10.3)
Chloride: 102 mmol/L (ref 98–111)
Creatinine, Ser: 1.16 mg/dL (ref 0.61–1.24)
GFR calc Af Amer: >60 mL/min (ref >60)
GFR calc non Af Amer: >60 mL/min (ref >60)
Glucose, Bld: 176 mg/dL — ABNORMAL HIGH (ref 70–99)
Potassium: 4 mmol/L (ref 3.5–5.1)
Sodium: 139 mmol/L (ref 135–145)

## 2019-10-13 LAB — HEPARIN LEVEL (UNFRACTIONATED)
Heparin Unfractionated: 0.25 IU/mL — ABNORMAL LOW (ref 0.30–0.70)
Heparin Unfractionated: 0.19 IU/mL — ABNORMAL LOW (ref 0.30–0.70)
Heparin Unfractionated: 0.45 IU/mL (ref 0.30–0.70)

## 2019-10-13 LAB — GLUCOSE, CAPILLARY
Glucose-Capillary: 167 mg/dL — ABNORMAL HIGH (ref 70–99)
Glucose-Capillary: 141 mg/dL — ABNORMAL HIGH (ref 70–99)
Glucose-Capillary: 198 mg/dL — ABNORMAL HIGH (ref 70–99)
Glucose-Capillary: 116 mg/dL — ABNORMAL HIGH (ref 70–99)

## 2019-10-13 LAB — MAGNESIUM: Magnesium: 1.8 mg/dL (ref 1.7–2.4)

## 2019-10-13 LAB — PHOSPHORUS: Phosphorus: 3.8 mg/dL (ref 2.5–4.6)

## 2019-10-13 MED ORDER — CARVEDILOL 6.25 MG PO TABS
6.2500 mg | ORAL_TABLET | Freq: Two times a day (BID) | ORAL | Status: DC
  Administered 2019-10-14 – 2019-10-16 (×5): 6.25 mg via ORAL
  Filled 2019-10-13 (×5): qty 1

## 2019-10-13 MED ORDER — INSULIN ASPART PROT & ASPART (70-30 MIX) 100 UNIT/ML ~~LOC~~ SUSP
10.0000 [IU] | Freq: Two times a day (BID) | SUBCUTANEOUS | Status: DC
  Administered 2019-10-14 – 2019-10-15 (×2): 10 [IU] via SUBCUTANEOUS
  Filled 2019-10-13 (×2): qty 10

## 2019-10-13 MED ORDER — CARVEDILOL 6.25 MG PO TABS
6.2500 mg | ORAL_TABLET | Freq: Two times a day (BID) | ORAL | Status: DC
  Administered 2019-10-13: 6.25 mg via ORAL
  Filled 2019-10-13: qty 1

## 2019-10-13 NOTE — Progress Notes (Addendum)
Progress Note  Patient Name: [name] Date of Encounter: 10/13/2019  Primary Cardiologist: No primary care provider on file.   Subjective   Breathing better, no CP overnight  Inpatient Medications    Scheduled Meds:  aspirin  81 mg Oral Daily   atorvastatin  40 mg Oral q1800   furosemide  40 mg Intravenous Q12H   insulin aspart  0-15 Units Subcutaneous TID WC   insulin aspart  6 Units Subcutaneous TID WC   insulin aspart protamine- aspart  10 Units Subcutaneous BID WC   metoprolol tartrate  25 mg Oral TID   pantoprazole  40 mg Oral Daily   pneumococcal 23 valent vaccine  0.5 mL Intramuscular Tomorrow-1000   QUEtiapine  50 mg Oral QHS   sodium chloride flush  3 mL Intravenous Q12H   Continuous Infusions:  sodium chloride     sodium chloride 1,000 mL (10/13/19 0549)   azithromycin 500 mg (10/13/19 0551)   cefTRIAXone (ROCEPHIN)  IV Stopped (10/12/19 2253)   heparin 1,750 Units/hr (10/13/19 0723)   PRN Meds: sodium chloride, sodium chloride, acetaminophen, guaiFENesin, ipratropium-albuterol, nitroGLYCERIN, ondansetron (ZOFRAN) IV, sodium chloride flush   Vital Signs    Vitals:   10/12/19 2124 10/13/19 0100 10/13/19 0444 10/13/19 0831  BP: (!) 110/96 123/85 (!) 123/111 (!) 140/123  Pulse: 77 92 82 72  Resp: 18 20 20  (!) 22  Temp: (!) 97.5 F (36.4 C) 98.3 F (36.8 C) 98.1 F (36.7 C) 97.9 F (36.6 C)  TempSrc: Oral Oral Oral Oral  SpO2: 94% 93% 94% 96%  Weight:   131 kg   Height:        Intake/Output Summary (Last 24 hours) at 10/13/2019 1055 Last data filed at 10/13/2019 1006 Gross per 24 hour  Intake 463.14 ml  Output 2900 ml  Net -2436.86 ml   Filed Weights   10/11/19 1600 10/12/19 0011 10/13/19 0444  Weight: 133.8 kg 130.6 kg 131 kg    Telemetry    SR, PACs - Personally Reviewed  ECG    10/12/2019: ST with anterior infarct, nonspecific ST abnormality and LVH by voltage criteria - Personally Reviewed  Physical  Exam   General: Well developed, well nourished, male in no acute distress Head: Eyes PERRLA, Head normocephalic and atraumatic Lungs: decreased BS bases w/ some rales bilaterally to auscultation. Heart: HRRR S1 S2, without rub or gallop. No murmur. 4/4 extremity pulses are 2+ & equal. No JVD. Abdomen: Bowel sounds are present, abdomen soft and non-tender without masses or  hernias noted. Msk: Normal strength and tone for age. Extremities: No clubbing, cyanosis or edema.    Skin:  No rashes or lesions noted. Neuro: Alert and oriented X 3. Psych:  Good affect, responds appropriately  Labs    Chemistry Recent Labs  Lab 10/11/19 1603 10/12/19 0545 10/13/19 0523  NA 136 139 139  K 3.2* 4.0 4.0  CL 103 104 102  CO2 22 24 29   GLUCOSE 239* 315* 176*  BUN 8 15 20   CREATININE 0.83 0.99 1.16  CALCIUM 8.4* 8.7* 8.7*  GFRNONAA >60 >60 >60  GFRAA >60 >60 >60  ANIONGAP 11 11 8     Hematology Recent Labs  Lab 10/11/19 1603 10/13/19 0523  WBC 10.5 8.7  RBC 4.86 4.58  HGB 12.5* 12.0*  HCT 41.5 39.8  MCV 85.4 86.9  MCH 25.7* 26.2  MCHC 30.1 30.2  RDW 15.0 15.6*  PLT 317 350    Cardiac Enzymes High Sensitivity Troponin:  Recent Labs  Lab 10/11/19 1603 10/11/19 1841 10/12/19 0351 10/12/19 0545  TROPONINIHS 74* 68* 51* 54*     BNP Recent Labs  Lab 10/11/19 1603  BNP 1,202.2*      Radiology    Ct Angio Chest Pe W And/or Wo Contrast  Result Date: 10/11/2019 CLINICAL DATA:  Chest pain. EXAM: CT ANGIOGRAPHY CHEST WITH CONTRAST TECHNIQUE: Multidetector CT imaging of the chest was performed using the standard protocol during bolus administration of intravenous contrast. Multiplanar CT image reconstructions and MIPs were obtained to evaluate the vascular anatomy. CONTRAST:  163mL OMNIPAQUE IOHEXOL 350 MG/ML SOLN COMPARISON:  Chest x-ray 10/11/2019 FINDINGS: Cardiovascular: Cardiovascular heart size is enlarged with no signs of pericardial effusion. Calcified coronary artery  disease. Aortic caliber is normal. No signs of pulmonary embolism. Mediastinum/Nodes: Enlarged lymph nodes throughout the mediastinum. No axillary lymphadenopathy. No supraclavicular adenopathy. (Image 35, series 8) 2.5 cm right paratracheal lymph node. (Image 49, series 8): 1.8 cm lymph node in the subcarinal region along right lower lobe bronchus. Smaller lymph nodes scattered about the chest including in the AP window. No anterior mediastinal mass or adenopathy. Lungs/Pleura: Moderate right and small left pleural effusion layering dependently with interstitial nodular and ground-glass opacities in both right and left chest worse in the right upper lobe. Basilar airspace disease and volume loss. Narrowing of the bilateral mainstem bronchi. Upper Abdomen: No acute findings in the upper abdomen. Musculoskeletal: No acute bone finding or destructive bone process. Review of the MIP images confirms the above findings. IMPRESSION: 1. No pulmonary embolism. 2. Signs of heart failure likely with superimposed pneumonia. 3. Narrowing of mainstem bronchi distal trachea may be due to bronchomalacia or may be physiologic in the setting of edema and pneumonia. 4. Mild aortic atherosclerosis with signs of extensive coronary artery disease. 5. Mediastinal nodal enlargement may be reactive. Consider follow-up after resolution of acute symptoms to ensure resolution. Aortic Atherosclerosis (ICD10-I70.0). Electronically Signed   By: Zetta Bills M.D.   On: 10/11/2019 19:55   Dg Chest Port 1 View  Result Date: 10/11/2019 CLINICAL DATA:  Shortness of breath and chest pain EXAM: PORTABLE CHEST 1 VIEW COMPARISON:  09/23/2011 FINDINGS: The left lung is grossly clear. Ill-defined somewhat nodular airspace disease in the right upper lobe. Borderline cardiomegaly. No significant pleural effusion. No pneumothorax. Appearance of pleural deformity at the right apex. IMPRESSION: 1. Ill-defined somewhat nodular appearing opacity in the  right upper lobe which could be secondary to pneumonia versus lung nodules. Further evaluation with chest CT could be obtained. Electronically Signed   By: Donavan Foil M.D.   On: 10/11/2019 16:45    Cardiac Studies   ECHO: 10/12/2019  1. Left ventricular ejection fraction, by visual estimation, is 25 to 30%. The left ventricle has severely decreased function. There is no left ventricular hypertrophy.  2. Definity contrast agent was given IV to delineate the left ventricular endocardial borders.  3. Moderately dilated left ventricular internal cavity size.  4. The left ventricle demonstrates global hypokinesis.  5. Global right ventricle has normal systolic function.The right ventricular size is mildly enlarged. No increase in right ventricular wall thickness.  6. Left atrial size was normal.  7. Right atrial size was moderately dilated.  8. The mitral valve is normal in structure. No evidence of mitral valve regurgitation. No evidence of mitral stenosis.  9. The tricuspid valve is normal in structure. Tricuspid valve regurgitation is not demonstrated. 10. The aortic valve is normal in structure. Aortic valve regurgitation is trivial.  No evidence of aortic valve sclerosis or stenosis. 11. The pulmonic valve was normal in structure. Pulmonic valve regurgitation is not visualized. 12. The inferior vena cava is normal in size with greater than 50% respiratory variability, suggesting right atrial pressure of 3 mmHg.  Patient Profile     58 y.o. male with a hx of CAD s/p PCI to OM in 2012, DM, tobacco use who is being seen for the evaluation of dyspnea.  Assessment & Plan    1.  SOB - from PNA/CHF - on ABX & Lasix IV - no longer febrile  2.  ? New onset atrial fibrillation - maintaining SR, has PACs that are frequent at times plus artifact, mimics Afib - anticoag not indicated (but still on heparin for elevated trop and CP) - MD advise on event monitor  3.  Acute CHF - systolic by  echo  - I/O net -2.6 L, wt ?7 lbs down - EF 20-25% by echo, results above - continue diuresis - still w/ some overload  4.  HTN  - BP improved last 24 hrs - amlodipine d/c'd - will change Lopressor 25 mg tid to Coreg 6.25 mg bid - losartan was listed on initial med list, but he was not taking, not on updated med list - start losartan 25 mg qd after cath if renal function ok, goal is Entresto if tolerates (could start w/ Delene Loll, per MD)  5.  PNA - per IM  6.  ASCAD - hx PCI OM 2012 - mild elevation in troponin not c/w ACS - felt to be demand ischemia - hx intermittent CP - with decreased EF, needs ischemic eval, R/L Heart cath - may be able to do in am, will make NPO - pt says can lie flat   For questions or updates, please contact [facility] Please consult [email or web address] for contact info under Cardiology/STEMI.      Signed, Rosaria Ferries, PA-C  10/13/2019, 10:54 AM    I have examined the patient and reviewed assessment and plan and discussed with patient.  Agree with above as stated.  By tomorrow, Eliquis will be out of his system.  Plan for cath in AM.  Now on IV heparin.  Cath to eval decreased EF.   Cardiac catheterization was discussed with the patient fully. The patient understands that risks include but are not limited to stroke (1 in 1000), death (1 in 18), kidney failure [usually temporary] (1 in 500), bleeding (1 in 200), allergic reaction [possibly serious] (1 in 200).  The patient understands and is willing to proceed.    He is ok lying flat.  Did have some DOE walking to the bathroom. Unclear if he has had AFib, but was started on ELiquis.  Will have to decide on monitor at the time of discharge.   Larae Grooms

## 2019-10-13 NOTE — Plan of Care (Signed)
  Problem: Education: Goal: Knowledge of General Education information will improve Description Including pain rating scale, medication(s)/side effects and non-pharmacologic comfort measures Outcome: Progressing   

## 2019-10-13 NOTE — Plan of Care (Signed)
Consulted for nutrition education regarding CHF.   Provided pt "Low Sodium Nutrition Therapy" handout. Reviewed patients dietary recall. Provided examples on ways to decrease sodium intake. Discouraged intake of processed foods and eliminating the salt shaker. Encouraged fresh fruits and vegetables as well as whole grain sources of carbohydrates. Patient stated they usually eat canned vegetables but will switch to frozen to eliminate extra sodium. Discussed the importance of medication and diet compliance to prevent fluid overload. Teach back method used.   Patient lives with sister and does his own cooking. Pt stated he typically makes fried chicken and seasons with extra salt. Discussed a variety of ways to reduce sodium and pt seemed disinterested. Expect poor compliance.   Medications reviewed: Lipitor 40mg , Lasix 40mg , Novolog, Novolog Mix 70/30 10 units, Lopressor 25mg , Protonix 40mg , Seroquel 50mg , zithromax 500 mg in NaCl 0.9% 222ml IVPB, rocephin 2g in NaCl 0.9% 11ml IVPB, heparin  Labs reviewed: glucose 167, Hgb A1C 11.0  BMI 37.07, Pt meets criteria for obesity class 2 based on current BMI.  Current diet order is CHO modified., patient is currently consuming 100% of meals at this time. No further nutrition interventions warranted at this time. If additional nutrition issues arise, please re-consult RD.  Allen Norris Dietetic Intern Pager # [phone]

## 2019-10-13 NOTE — Progress Notes (Signed)
ANTICOAGULATION CONSULT NOTE  Pharmacy Consult for heparin Indication: atrial fibrillation  No Known Allergies  Patient Measurements: Height: 6\' 2"  (188 cm) Weight: 288 lb 11.2 oz (131 kg)(scale b) IBW/kg (Calculated) : 82.2 Heparin Dosing Weight: 111 kg  Vital Signs: Temp: 98.1 F (36.7 C) (11/16 0444) Temp Source: Oral (11/16 0444) BP: 123/111 (11/16 0444) Pulse Rate: 82 (11/16 0444)  Labs: Recent Labs    10/11/19 1603 10/11/19 1841 10/12/19 0351 10/12/19 0545 10/13/19 0523  HGB 12.5*  --   --   --  12.0*  HCT 41.5  --   --   --  39.8  PLT 317  --   --   --  350  HEPARINUNFRC  --   --   --   --  0.25*  CREATININE 0.83  --   --  0.99 1.16  TROPONINIHS 74* 68* 51* 54*  --     Estimated Creatinine Clearance: 98.6 mL/min (by C-G formula based on SCr of 1.16 mg/dL).   Medical History: Past Medical History:  Diagnosis Date  . Anxiety   . CAD (coronary artery disease)   . Depression   . Diabetes mellitus   . Hypertension   . Schizophrenia ([place])     Medications:  Scheduled:  . aspirin  81 mg Oral Daily  . atorvastatin  40 mg Oral q1800  . furosemide  40 mg Intravenous Q12H  . influenza vac split quadrivalent PF  0.5 mL Intramuscular Tomorrow-1000  . insulin aspart  0-15 Units Subcutaneous TID WC  . insulin aspart  6 Units Subcutaneous TID WC  . insulin aspart protamine- aspart  10 Units Subcutaneous BID WC  . metoprolol tartrate  25 mg Oral TID  . pantoprazole  40 mg Oral Daily  . pneumococcal 23 valent vaccine  0.5 mL Intramuscular Tomorrow-1000  . QUEtiapine  50 mg Oral QHS  . sodium chloride flush  3 mL Intravenous Q12H    Assessment: Patient is a 75 yom admitted for SOB and was on Eliquis for afib present on admission (CHADs2VASC score is 3 (CAD/HTN/DM)). Last dose of Eliquis was 11/15 at 0817.   CBC on 11/16 shows a H&H of 12/39.8 and wnl plts.  Heparin level subtherapeutic at 0.25.  Goal of Therapy:  Heparin level 0.3-0.7 units/ml Monitor  platelets by anticoagulation protocol: Yes   Plan:  Increase heparin infusion at 1750 units/hr Check 6 hr HL Daily HL and CBC Continue to monitor H&H and platelets   Thank you,   Lorel Monaco, PharmD PGY1 Ambulatory Care Resident [employer] # [phone]  Please check amion for clinical pharmacist contact number 10/13/2019,7:06 AM

## 2019-10-13 NOTE — Progress Notes (Signed)
ANTICOAGULATION CONSULT NOTE  Pharmacy Consult for heparin Indication: atrial fibrillation  No Known Allergies  Patient Measurements: Height: 6\' 2"  (188 cm) Weight: 288 lb 11.2 oz (131 kg)(scale b) IBW/kg (Calculated) : 82.2 Heparin Dosing Weight: 111 kg  Vital Signs: Temp: 97.9 F (36.6 C) (11/16 1200) Temp Source: Oral (11/16 1200) BP: 131/84 (11/16 1200) Pulse Rate: 86 (11/16 1200)  Labs: Recent Labs    10/11/19 1603 10/11/19 1841 10/12/19 0351 10/12/19 0545 10/13/19 0523 10/13/19 1258  HGB 12.5*  --   --   --  12.0*  --   HCT 41.5  --   --   --  39.8  --   PLT 317  --   --   --  350  --   APTT  --   --   --   --  30 34  HEPARINUNFRC  --   --   --   --  0.25* 0.19*  CREATININE 0.83  --   --  0.99 1.16  --   TROPONINIHS 74* 68* 51* 54*  --   --     Estimated Creatinine Clearance: 98.6 mL/min (by C-G formula based on SCr of 1.16 mg/dL).   Medical History: Past Medical History:  Diagnosis Date  . Anxiety   . CAD (coronary artery disease)   . Depression   . Diabetes mellitus   . Hypertension   . Schizophrenia ([place])     Medications:  Scheduled:  . aspirin  81 mg Oral Daily  . atorvastatin  40 mg Oral q1800  . carvedilol  6.25 mg Oral BID WC  . furosemide  40 mg Intravenous Q12H  . insulin aspart  0-15 Units Subcutaneous TID WC  . insulin aspart  6 Units Subcutaneous TID WC  . insulin aspart protamine- aspart  10 Units Subcutaneous BID WC  . pantoprazole  40 mg Oral Daily  . pneumococcal 23 valent vaccine  0.5 mL Intramuscular Tomorrow-1000  . QUEtiapine  50 mg Oral QHS  . sodium chloride flush  3 mL Intravenous Q12H    Assessment: Patient is a 67 yom admitted for SOB and was on Eliquis for afib present on admission (CHADs2VASC score is 3 (CAD/HTN/DM)). Last dose of Eliquis was 11/15 at 0817.   CBC on 11/16 shows a H&H of 12/39.8 and wnl plts.  Heparin level continues to be subtherapeutic at 0.19. Aptt normal at 34s.   Goal of Therapy:  Heparin  level 0.3-0.7 units/ml Monitor platelets by anticoagulation protocol: Yes   Plan:  Increase heparin infusion at 2000 units/hr Check 6 hr HL Daily HL and CBC Continue to monitor H&H and platelets   Thank you,   Erin Hearing PharmD., BCPS Clinical Pharmacist 10/13/2019 3:18 PM  Please check amion for clinical pharmacist contact number

## 2019-10-13 NOTE — Progress Notes (Signed)
ANTICOAGULATION CONSULT NOTE - Follow Up Consult  Pharmacy Consult for heparin Indication: atrial fibrillation  Labs: Recent Labs    10/11/19 1603 10/11/19 1841 10/12/19 0351 10/12/19 0545 10/13/19 0523 10/13/19 1258 10/13/19 2222  HGB 12.5*  --   --   --  12.0*  --   --   HCT 41.5  --   --   --  39.8  --   --   PLT 317  --   --   --  350  --   --   APTT  --   --   --   --  30 34  --   HEPARINUNFRC  --   --   --   --  0.25* 0.19* 0.45  CREATININE 0.83  --   --  0.99 1.16  --   --   TROPONINIHS 74* 68* 51* 54*  --   --   --     Assessment/Plan:  58yo male therapeutic on heparin after rate change. Will continue gtt at current rate and confirm stable with am labs.   Wynona Neat, PharmD, BCPS  10/13/2019,11:08 PM

## 2019-10-13 NOTE — Progress Notes (Signed)
PROGRESS NOTE    [name]  [name] DOB: [birth year] DOA: 10/11/2019 PCP: Nolene Ebbs, MD    Brief Narrative:  58 year old gentleman with history of coronary artery disease status post PCI in 2012, type 2 diabetes, hypertension, schizophrenia, noncompliance issues and currently without maintenance health care admitted to the hospital with increasing shortness of breath and intermittent chest pain for more than a month.  According to the patient he has not taken any medication for last 7 months at least.  In the emergency room he was found with hypoxic 81% on walking, leg edema, proBNP more than thousand.  Chest x-ray also suggestive of possible pneumonia.  Admitted with cardiology consultation. Suspected to have A. Fib or atrial tachycardia.    Assessment & Plan:   Active Problems:   CHF (congestive heart failure) (HCC)  Acute CHF: systolic HF . EF 25%. Good response to IV diuresis.  Continue IV diuresis.  Intake output monitoring.  Followed by cardiology. May need cardiac cath with h/o PCI.  ?  New onset A. fib: Probably atrial tachycardia.  Now sinus rhythm.  Was on Eliquis, stopped, started heparin.  Rate controlled.  Elevated troponin: Mild elevated troponin probably due to tachycardia. May need ischemic evaluation given CHF.  Community acquired pneumonia: stabilizing. Cultures negative so far.  Continue chest physiotherapy.  Currently on Rocephin and azithromycin.we will continue. Symptoms due to heart failure? Will continue 5 days of abx. Breathing exercises and chest physiotherapy.  Hyperlipidemia: On a statin.  Type 2 diabetes with hyperglycemia: Currently untreated.  A1c 11.  Previously on Metformin.  Patient will probably need insulin on discharge.  Will keep on 70/30 insulin and optimize dose before discharge.  Hypertension: Blood pressures are stable.   DVT prophylaxis: Heparin drip Code Status: Full code Family Communication: None Disposition  Plan: Home after hospitalization   Consultants:   Cardiology  Procedures:   None  Antimicrobials:   Rocephin, azithromycin, 10/11/2019---   Subjective: Patient seen and examined.  No overnight events. Feels SOB and heavy ness of chest. Sinus in tele.   Objective: Vitals:   10/12/19 1559 10/12/19 2124 10/13/19 0100 10/13/19 0444  BP: (!) 157/107 (!) 110/96 123/85 (!) 123/111  Pulse: 82 77 92 82  Resp:  18 20 20   Temp: 97.7 F (36.5 C) (!) 97.5 F (36.4 C) 98.3 F (36.8 C) 98.1 F (36.7 C)  TempSrc: Oral Oral Oral Oral  SpO2: 92% 94% 93% 94%  Weight:    131 kg  Height:        Intake/Output Summary (Last 24 hours) at 10/13/2019 0821 Last data filed at 10/13/2019 0800 Gross per 24 hour  Intake 803.14 ml  Output 1950 ml  Net -1146.86 ml   Filed Weights   10/11/19 1600 10/12/19 0011 10/13/19 0444  Weight: 133.8 kg 130.6 kg 131 kg    Examination:  General exam: Appears calm and comfortable, on 2 L oxygen. Respiratory system: Clear to auscultation. Respiratory effort normal.  No added sound. Cardiovascular system: S1 & S2 heard, RRR.  Tachycardic.  No JVD, murmurs, rubs, gallops or clicks.  1+ bilateral pedal edema.   Gastrointestinal system: Abdomen is nondistended, soft and nontender. No organomegaly or masses felt. Normal bowel sounds heard. Obese and pendulous.  Central nervous system: Alert and oriented. No focal neurological deficits. Extremities: Symmetric 5 x 5 power. Skin: No rashes, lesions or ulcers Psychiatry: Judgement and insight appear normal. Mood & affect appropriate.     Data Reviewed: I have personally reviewed  following labs and imaging studies  CBC: Recent Labs  Lab 10/11/19 1603 10/13/19 0523  WBC 10.5 8.7  NEUTROABS 8.7*  --   HGB 12.5* 12.0*  HCT 41.5 39.8  MCV 85.4 86.9  PLT 317 [id number]   Basic Metabolic Panel: Recent Labs  Lab 10/11/19 1603 10/12/19 0545 10/13/19 0523  NA 136 139 139  K 3.2* 4.0 4.0  CL 103 104 102  CO2  22 24 29   GLUCOSE 239* 315* 176*  BUN 8 15 20   CREATININE 0.83 0.99 1.16  CALCIUM 8.4* 8.7* 8.7*  MG  --  2.0 1.8  PHOS  --  3.7 3.8   GFR: Estimated Creatinine Clearance: 98.6 mL/min (by C-G formula based on SCr of 1.16 mg/dL). Liver Function Tests: No results for input(s): AST, ALT, ALKPHOS, BILITOT, PROT, ALBUMIN in the last 168 hours. No results for input(s): LIPASE, AMYLASE in the last 168 hours. No results for input(s): AMMONIA in the last 168 hours. Coagulation Profile: No results for input(s): INR, PROTIME in the last 168 hours. Cardiac Enzymes: No results for input(s): CKTOTAL, CKMB, CKMBINDEX, TROPONINI in the last 168 hours. BNP (last 3 results) No results for input(s): PROBNP in the last 8760 hours. HbA1C: Recent Labs    10/12/19 0351  HGBA1C 11.0*   CBG: Recent Labs  Lab 10/12/19 0615 10/12/19 1130 10/12/19 1600 10/12/19 2124 10/13/19 0613  GLUCAP 279* 85 172* 182* 167*   Lipid Profile: No results for input(s): CHOL, HDL, LDLCALC, TRIG, CHOLHDL, LDLDIRECT in the last 72 hours. Thyroid Function Tests: Recent Labs    10/12/19 1026  TSH 1.741   Anemia Panel: No results for input(s): VITAMINB12, FOLATE, FERRITIN, TIBC, IRON, RETICCTPCT in the last 72 hours. Sepsis Labs: No results for input(s): PROCALCITON, LATICACIDVEN in the last 168 hours.  Recent Results (from the past 240 hour(s))  SARS CORONAVIRUS 2 (TAT 6-24 HRS) Nasopharyngeal Nasopharyngeal Swab     Status: None   Collection Time: 10/11/19  6:00 PM   Specimen: Nasopharyngeal Swab  Result Value Ref Range Status   SARS Coronavirus 2 NEGATIVE NEGATIVE Final    Comment: (NOTE) SARS-CoV-2 target nucleic acids are NOT DETECTED. The SARS-CoV-2 RNA is generally detectable in upper and lower respiratory specimens during the acute phase of infection. Negative results do not preclude SARS-CoV-2 infection, do not rule out co-infections with other pathogens, and should not be used as the sole basis  for treatment or other patient management decisions. Negative results must be combined with clinical observations, patient history, and epidemiological information. The expected result is Negative. Fact Sheet for Patients: SugarRoll.be Fact Sheet for Healthcare Providers: https://www.woods-mathews.com/ This test is not yet approved or cleared by the [place] FDA and  has been authorized for detection and/or diagnosis of SARS-CoV-2 by FDA under an Emergency Use Authorization (EUA). This EUA will remain  in effect (meaning this test can be used) for the duration of the COVID-19 declaration under Section 56 4(b)(1) of the Act, 21 U.S.C. section 360bbb-3(b)(1), unless the authorization is terminated or revoked sooner. Performed at [facility], [place] [address] [place], [place] [address]   Sputum culture     Status: None   Collection Time: 10/12/19  7:00 AM   Specimen: Sputum  Result Value Ref Range Status   Specimen Description SPUTUM  Final   Special Requests NONE  Final   Sputum evaluation   Final    THIS SPECIMEN IS ACCEPTABLE FOR SPUTUM CULTURE Performed at [facility], [address] [place], [place] [address]    Report Status 10/12/2019 FINAL  Final  Culture, respiratory     Status: None (Preliminary result)   Collection Time: 10/12/19  7:00 AM   Specimen: SPU  Result Value Ref Range Status   Specimen Description SPUTUM  Final   Special Requests NONE Reflexed from [id number]  Final   Gram Stain   Final    MODERATE WBC PRESENT, PREDOMINANTLY PMN ABUNDANT GRAM POSITIVE COCCI IN PAIRS IN CLUSTERS MODERATE GRAM NEGATIVE RODS MODERATE GRAM POSITIVE RODS Performed at [facility], [place] [address], [place], [place] [address]    Culture PENDING  Incomplete   Report Status PENDING  Incomplete         Radiology Studies: Ct Angio Chest Pe W And/or Wo Contrast  Result Date: 10/11/2019 CLINICAL DATA:  Chest pain.  EXAM: CT ANGIOGRAPHY CHEST WITH CONTRAST TECHNIQUE: Multidetector CT imaging of the chest was performed using the standard protocol during bolus administration of intravenous contrast. Multiplanar CT image reconstructions and MIPs were obtained to evaluate the vascular anatomy. CONTRAST:  133mL OMNIPAQUE IOHEXOL 350 MG/ML SOLN COMPARISON:  Chest x-ray 10/11/2019 FINDINGS: Cardiovascular: Cardiovascular heart size is enlarged with no signs of pericardial effusion. Calcified coronary artery disease. Aortic caliber is normal. No signs of pulmonary embolism. Mediastinum/Nodes: Enlarged lymph nodes throughout the mediastinum. No axillary lymphadenopathy. No supraclavicular adenopathy. (Image 35, series 8) 2.5 cm right paratracheal lymph node. (Image 49, series 8): 1.8 cm lymph node in the subcarinal region along right lower lobe bronchus. Smaller lymph nodes scattered about the chest including in the AP window. No anterior mediastinal mass or adenopathy. Lungs/Pleura: Moderate right and small left pleural effusion layering dependently with interstitial nodular and ground-glass opacities in both right and left chest worse in the right upper lobe. Basilar airspace disease and volume loss. Narrowing of the bilateral mainstem bronchi. Upper Abdomen: No acute findings in the upper abdomen. Musculoskeletal: No acute bone finding or destructive bone process. Review of the MIP images confirms the above findings. IMPRESSION: 1. No pulmonary embolism. 2. Signs of heart failure likely with superimposed pneumonia. 3. Narrowing of mainstem bronchi distal trachea may be due to bronchomalacia or may be physiologic in the setting of edema and pneumonia. 4. Mild aortic atherosclerosis with signs of extensive coronary artery disease. 5. Mediastinal nodal enlargement may be reactive. Consider follow-up after resolution of acute symptoms to ensure resolution. Aortic Atherosclerosis (ICD10-I70.0). Electronically Signed   By: Zetta Bills  M.D.   On: 10/11/2019 19:55   Dg Chest Port 1 View  Result Date: 10/11/2019 CLINICAL DATA:  Shortness of breath and chest pain EXAM: PORTABLE CHEST 1 VIEW COMPARISON:  09/23/2011 FINDINGS: The left lung is grossly clear. Ill-defined somewhat nodular airspace disease in the right upper lobe. Borderline cardiomegaly. No significant pleural effusion. No pneumothorax. Appearance of pleural deformity at the right apex. IMPRESSION: 1. Ill-defined somewhat nodular appearing opacity in the right upper lobe which could be secondary to pneumonia versus lung nodules. Further evaluation with chest CT could be obtained. Electronically Signed   By: Donavan Foil M.D.   On: 10/11/2019 16:45        Scheduled Meds:  aspirin  81 mg Oral Daily   atorvastatin  40 mg Oral q1800   furosemide  40 mg Intravenous Q12H   influenza vac split quadrivalent PF  0.5 mL Intramuscular Tomorrow-1000   insulin aspart  0-15 Units Subcutaneous TID WC   insulin aspart  6 Units Subcutaneous TID WC  insulin aspart protamine- aspart  10 Units Subcutaneous BID WC   metoprolol tartrate  25 mg Oral TID   pantoprazole  40 mg Oral Daily   pneumococcal 23 valent vaccine  0.5 mL Intramuscular Tomorrow-1000   QUEtiapine  50 mg Oral QHS   sodium chloride flush  3 mL Intravenous Q12H   Continuous Infusions:  sodium chloride     sodium chloride 1,000 mL (10/13/19 0549)   azithromycin 500 mg (10/13/19 0551)   cefTRIAXone (ROCEPHIN)  IV Stopped (10/12/19 2253)   heparin 1,750 Units/hr (10/13/19 0723)     LOS: 2 days    Time spent: 30 minutes    Barb Merino, MD [facility] Pager [phone]

## 2019-10-14 ENCOUNTER — Encounter (HOSPITAL_COMMUNITY)
Admission: EM | Disposition: A | Discharge status: Home / Self Care | Payer: Self-pay | Source: Home / Self Care | Attending: Internal Medicine

## 2019-10-14 DIAGNOSIS — I5043 Acute on chronic combined systolic (congestive) and diastolic (congestive) heart failure: Secondary | ICD-10-CM

## 2019-10-14 HISTORY — PX: RIGHT/LEFT HEART CATH AND CORONARY ANGIOGRAPHY: CATH118266

## 2019-10-14 LAB — CBC
HCT: 38.5 % — ABNORMAL LOW (ref 39.0–52.0)
Hemoglobin: 11.8 g/dL — ABNORMAL LOW (ref 13.0–17.0)
MCH: 26 pg (ref 26.0–34.0)
MCHC: 30.6 g/dL (ref 30.0–36.0)
MCV: 84.8 fL (ref 80.0–100.0)
Platelets: 345 10*3/uL (ref 150–400)
RBC: 4.54 MIL/uL (ref 4.22–5.81)
RDW: 15 % (ref 11.5–15.5)
WBC: 7.4 10*3/uL (ref 4.0–10.5)
nRBC: 0 % (ref 0.0–0.2)

## 2019-10-14 LAB — POCT I-STAT EG7
Acid-Base Excess: 4 mmol/L — ABNORMAL HIGH (ref 0.0–2.0)
Bicarbonate: 28.7 mmol/L — ABNORMAL HIGH (ref 20.0–28.0)
Calcium, Ion: 1.12 mmol/L — ABNORMAL LOW (ref 1.15–1.40)
HCT: 36 % — ABNORMAL LOW (ref 39.0–52.0)
Hemoglobin: 12.2 g/dL — ABNORMAL LOW (ref 13.0–17.0)
O2 Saturation: 54 %
Potassium: 3 mmol/L — ABNORMAL LOW (ref 3.5–5.1)
Sodium: 142 mmol/L (ref 135–145)
TCO2: 30 mmol/L (ref 22–32)
pCO2, Ven: 41.5 mmHg — ABNORMAL LOW (ref 44.0–60.0)
pH, Ven: 7.447 — ABNORMAL HIGH (ref 7.250–7.430)
pO2, Ven: 27 mmHg — CL (ref 32.0–45.0)

## 2019-10-14 LAB — POCT I-STAT 7, (LYTES, BLD GAS, ICA,H+H)
Acid-Base Excess: 4 mmol/L — ABNORMAL HIGH (ref 0.0–2.0)
Bicarbonate: 26.8 mmol/L (ref 20.0–28.0)
Calcium, Ion: 1.15 mmol/L (ref 1.15–1.40)
HCT: 36 % — ABNORMAL LOW (ref 39.0–52.0)
Hemoglobin: 12.2 g/dL — ABNORMAL LOW (ref 13.0–17.0)
O2 Saturation: 95 %
Potassium: 3 mmol/L — ABNORMAL LOW (ref 3.5–5.1)
Sodium: 141 mmol/L (ref 135–145)
TCO2: 28 mmol/L (ref 22–32)
pCO2 arterial: 33.1 mmHg (ref 32.0–48.0)
pH, Arterial: 7.516 — ABNORMAL HIGH (ref 7.350–7.450)
pO2, Arterial: 67 mmHg — ABNORMAL LOW (ref 83.0–108.0)

## 2019-10-14 LAB — CULTURE, RESPIRATORY W GRAM STAIN: Culture: NORMAL

## 2019-10-14 LAB — GLUCOSE, CAPILLARY
Glucose-Capillary: 105 mg/dL — ABNORMAL HIGH (ref 70–99)
Glucose-Capillary: 121 mg/dL — ABNORMAL HIGH (ref 70–99)
Glucose-Capillary: 133 mg/dL — ABNORMAL HIGH (ref 70–99)
Glucose-Capillary: 162 mg/dL — ABNORMAL HIGH (ref 70–99)

## 2019-10-14 LAB — BASIC METABOLIC PANEL
Anion gap: 12 (ref 5–15)
BUN: 19 mg/dL (ref 6–20)
CO2: 29 mmol/L (ref 22–32)
Calcium: 8.7 mg/dL — ABNORMAL LOW (ref 8.9–10.3)
Chloride: 101 mmol/L (ref 98–111)
Creatinine, Ser: 1.13 mg/dL (ref 0.61–1.24)
GFR calc Af Amer: >60 mL/min (ref >60)
GFR calc non Af Amer: >60 mL/min (ref >60)
Glucose, Bld: 114 mg/dL — ABNORMAL HIGH (ref 70–99)
Potassium: 4.1 mmol/L (ref 3.5–5.1)
Sodium: 142 mmol/L (ref 135–145)

## 2019-10-14 LAB — HEPARIN LEVEL (UNFRACTIONATED): Heparin Unfractionated: 0.44 IU/mL (ref 0.30–0.70)

## 2019-10-14 LAB — LEGIONELLA PNEUMOPHILA SEROGP 1 UR AG: L. pneumophila Serogp 1 Ur Ag: NEGATIVE

## 2019-10-14 SURGERY — RIGHT/LEFT HEART CATH AND CORONARY ANGIOGRAPHY
Anesthesia: LOCAL

## 2019-10-14 MED ORDER — VERAPAMIL HCL 2.5 MG/ML IV SOLN
INTRAVENOUS | Status: DC | PRN
  Administered 2019-10-14: 3 mL via INTRA_ARTERIAL
  Administered 2019-10-14: 10 mL via INTRA_ARTERIAL

## 2019-10-14 MED ORDER — SODIUM CHLORIDE 0.9% FLUSH: 3.0000 mL | INTRAVENOUS | Status: DC | PRN

## 2019-10-14 MED ORDER — IOHEXOL 350 MG/ML SOLN
INTRAVENOUS | Status: DC | PRN
  Administered 2019-10-14: 80 mL via INTRA_ARTERIAL

## 2019-10-14 MED ORDER — HYDRALAZINE HCL 20 MG/ML IJ SOLN: 10.0000 mg | INTRAMUSCULAR | Status: AC | PRN

## 2019-10-14 MED ORDER — HEPARIN SODIUM (PORCINE) 1000 UNIT/ML IJ SOLN
INTRAMUSCULAR | Status: DC | PRN
  Administered 2019-10-14: 5000 [IU] via INTRAVENOUS

## 2019-10-14 MED ORDER — HEPARIN (PORCINE) IN NACL 1000-0.9 UT/500ML-% IV SOLN
INTRAVENOUS | Status: AC
  Filled 2019-10-14: qty 500

## 2019-10-14 MED ORDER — ORAL CARE MOUTH RINSE
15.0000 mL | Freq: Two times a day (BID) | OROMUCOSAL | Status: DC
  Administered 2019-10-14 – 2019-10-16 (×3): 15 mL via OROMUCOSAL

## 2019-10-14 MED ORDER — MIDAZOLAM HCL 2 MG/2ML IJ SOLN
INTRAMUSCULAR | Status: DC | PRN
  Administered 2019-10-14: 1 mg via INTRAVENOUS

## 2019-10-14 MED ORDER — SODIUM CHLORIDE 0.9% FLUSH
3.0000 mL | Freq: Two times a day (BID) | INTRAVENOUS | Status: DC
  Administered 2019-10-14 – 2019-10-16 (×4): 3 mL via INTRAVENOUS

## 2019-10-14 MED ORDER — SODIUM CHLORIDE 0.9 % IV SOLN: 250.0000 mL | INTRAVENOUS | Status: DC | PRN

## 2019-10-14 MED ORDER — VERAPAMIL HCL 2.5 MG/ML IV SOLN
INTRAVENOUS | Status: AC
  Filled 2019-10-14: qty 2

## 2019-10-14 MED ORDER — APIXABAN 5 MG PO TABS
5.0000 mg | ORAL_TABLET | Freq: Two times a day (BID) | ORAL | Status: DC
  Administered 2019-10-14 – 2019-10-16 (×4): 5 mg via ORAL
  Filled 2019-10-14 (×4): qty 1

## 2019-10-14 MED ORDER — ASPIRIN 81 MG PO CHEW
81.0000 mg | CHEWABLE_TABLET | ORAL | Status: AC
  Administered 2019-10-14: 81 mg via ORAL

## 2019-10-14 MED ORDER — FENTANYL CITRATE (PF) 100 MCG/2ML IJ SOLN
INTRAMUSCULAR | Status: DC | PRN
  Administered 2019-10-14 (×2): 25 ug via INTRAVENOUS

## 2019-10-14 MED ORDER — LIDOCAINE HCL (PF) 1 % IJ SOLN
INTRAMUSCULAR | Status: DC | PRN
  Administered 2019-10-14: 5 mL via INTRADERMAL
  Administered 2019-10-14: 2 mL via INTRADERMAL

## 2019-10-14 MED ORDER — ACETAMINOPHEN 325 MG PO TABS
650.0000 mg | ORAL_TABLET | ORAL | Status: DC | PRN
  Administered 2019-10-14 – 2019-10-15 (×2): 650 mg via ORAL
  Filled 2019-10-14 (×2): qty 2

## 2019-10-14 MED ORDER — LIDOCAINE HCL (PF) 1 % IJ SOLN
INTRAMUSCULAR | Status: AC
  Filled 2019-10-14: qty 30

## 2019-10-14 MED ORDER — SODIUM CHLORIDE 0.9 % IV SOLN
INTRAVENOUS | Status: DC
  Administered 2019-10-14: 10:00:00 via INTRAVENOUS

## 2019-10-14 MED ORDER — HEPARIN SODIUM (PORCINE) 1000 UNIT/ML IJ SOLN
INTRAMUSCULAR | Status: AC
  Filled 2019-10-14: qty 1

## 2019-10-14 MED ORDER — FENTANYL CITRATE (PF) 100 MCG/2ML IJ SOLN
INTRAMUSCULAR | Status: AC
  Filled 2019-10-14: qty 2

## 2019-10-14 MED ORDER — MIDAZOLAM HCL 2 MG/2ML IJ SOLN
INTRAMUSCULAR | Status: AC
  Filled 2019-10-14: qty 2

## 2019-10-14 MED ORDER — LABETALOL HCL 5 MG/ML IV SOLN: 10.0000 mg | INTRAVENOUS | Status: AC | PRN

## 2019-10-14 MED ORDER — ONDANSETRON HCL 4 MG/2ML IJ SOLN: 4.0000 mg | Freq: Four times a day (QID) | INTRAMUSCULAR | Status: DC | PRN

## 2019-10-14 MED ORDER — HEPARIN (PORCINE) IN NACL 1000-0.9 UT/500ML-% IV SOLN
INTRAVENOUS | Status: DC | PRN
  Administered 2019-10-14 (×2): 500 mL

## 2019-10-14 MED ORDER — INSULIN ASPART 100 UNIT/ML ~~LOC~~ SOLN
6.0000 [IU] | Freq: Three times a day (TID) | SUBCUTANEOUS | Status: DC
  Administered 2019-10-14 – 2019-10-15 (×2): 6 [IU] via SUBCUTANEOUS

## 2019-10-14 SURGICAL SUPPLY — 16 items
CATH 5FR JL3.5 JR4 ANG PIG MP (CATHETERS) ×2 IMPLANT
CATH BALLN WEDGE 5F 110CM (CATHETERS) ×2 IMPLANT
CATH INFINITI 4FR JL 4.0 (CATHETERS) ×2 IMPLANT
CATH INFINITI 5FR JL4 (CATHETERS) ×2 IMPLANT
DEVICE RAD COMP TR BAND LRG (VASCULAR PRODUCTS) ×2 IMPLANT
GLIDESHEATH SLEND A-KIT 6F 22G (SHEATH) ×2 IMPLANT
GUIDEWIRE INQWIRE 1.5J.035X260 (WIRE) ×1 IMPLANT
INQWIRE 1.5J .035X260CM (WIRE) ×2
KIT HEART LEFT (KITS) ×2 IMPLANT
PACK CARDIAC CATHETERIZATION (CUSTOM PROCEDURE TRAY) ×2 IMPLANT
SHEATH GLIDE SLENDER 4/5FR (SHEATH) ×2 IMPLANT
SHEATH PROBE COVER 6X72 (BAG) ×2 IMPLANT
TRANSDUCER W/STOPCOCK (MISCELLANEOUS) ×2 IMPLANT
TUBING CIL FLEX 10 FLL-RA (TUBING) ×2 IMPLANT
WIRE EMERALD 3MM-J .025X260CM (WIRE) ×2 IMPLANT
WIRE HI TORQ VERSACORE-J 145CM (WIRE) ×2 IMPLANT

## 2019-10-14 NOTE — Interval H&P Note (Signed)
Cath Lab Visit (complete for each Cath Lab visit)  Clinical Evaluation Leading to the Procedure:   ACS: No.  Non-ACS:    Anginal Classification: CCS III  Anti-ischemic medical therapy: Maximal Therapy (2 or more classes of medications)  Non-Invasive Test Results: No non-invasive testing performed  Prior CABG: No previous CABG      History and Physical Interval Note:  10/14/2019 2:35 PM  [name]  has presented today for surgery, with the diagnosis of chf.  The various methods of treatment have been discussed with the patient and family. After consideration of risks, benefits and other options for treatment, the patient has consented to  Procedure(s): RIGHT/LEFT HEART CATH AND CORONARY ANGIOGRAPHY (N/A) as a surgical intervention.  The patient's history has been reviewed, patient examined, no change in status, stable for surgery.  I have reviewed the patient's chart and labs.  Questions were answered to the patient's satisfaction.     Belva Crome III

## 2019-10-14 NOTE — CV Procedure (Signed)
   Left and right heart cath via right radial artery and brachial vein respectively.  Arterial access via real-time vascular ultrasound.  Codominant right coronary with proximal 85 to 90% stenosis followed by a further distal proximal stenosis of 60 to 70%.  RCA supplies RV predominantly.  Left main is widely patent  Proximal LAD is widely patent with diffuse mid 30 to 40% narrowing.  First diagonal contains segmental proximal 90% stenosis.  Circumflex coronary artery contains a first obtuse marginal that is relatively small and has proximal 90% stenosis.  The second obtuse marginal has been stented just beyond a bifurcation and is totally occluded with left to left collaterals noted.  There is distal diffuse disease in the circumflex with an occluded PDA and 80% distal stenosis before small third obtuse marginal  Right heart pressures suggest mild to moderate pulmonary hypertension with mean pressure of 31 mmHg.  Mean wedge pressure 16 mmHg.  Calculated cardiac index 2.02 L/min/m  Recommend guideline therapy for systolic heart failure.  Only PCI option is the first diagonal and the proximal RCA.[name] Kitchen  RCA supplies only the RV and would not likely help systolic left ventricular function.

## 2019-10-14 NOTE — Progress Notes (Signed)
ANTICOAGULATION CONSULT NOTE  Pharmacy Consult for heparin Indication: atrial fibrillation  No Known Allergies  Patient Measurements: Height: 6\' 2"  (188 cm) Weight: 285 lb 11.2 oz (129.6 kg)(scale b) IBW/kg (Calculated) : 82.2 Heparin Dosing Weight: 111 kg  Vital Signs: Temp: 98.6 F (37 C) (11/17 0924) Temp Source: Oral (11/17 0924) BP: 128/83 (11/17 0924) Pulse Rate: 58 (11/17 0924)  Labs: Recent Labs    10/11/19 1603 10/11/19 1841 10/12/19 0351 10/12/19 0545  10/13/19 0523 10/13/19 1258 10/13/19 2222 10/14/19 0317 10/14/19 0907  HGB 12.5*  --   --   --   --  12.0*  --   --  11.8*  --   HCT 41.5  --   --   --   --  39.8  --   --  38.5*  --   PLT 317  --   --   --   --  350  --   --  345  --   APTT  --   --   --   --   --  30 34  --   --   --   HEPARINUNFRC  --   --   --   --    < > 0.25* 0.19* 0.45  --  0.44  CREATININE 0.83  --   --  0.99  --  1.16  --   --  1.13  --   TROPONINIHS 74* 68* 51* 54*  --   --   --   --   --   --    < > = values in this interval not displayed.    Estimated Creatinine Clearance: 100.8 mL/min (by C-G formula based on SCr of 1.13 mg/dL).   Medical History: Past Medical History:  Diagnosis Date  . Anxiety   . CAD (coronary artery disease)   . Depression   . Diabetes mellitus   . Hypertension   . Schizophrenia ([place])     Medications:  Scheduled:  . aspirin  81 mg Oral Daily  . atorvastatin  40 mg Oral q1800  . carvedilol  6.25 mg Oral BID WC  . furosemide  40 mg Intravenous Q12H  . insulin aspart  0-15 Units Subcutaneous TID WC  . insulin aspart  6 Units Subcutaneous TID WC  . insulin aspart protamine- aspart  10 Units Subcutaneous BID WC  . mouth rinse  15 mL Mouth Rinse BID  . pantoprazole  40 mg Oral Daily  . pneumococcal 23 valent vaccine  0.5 mL Intramuscular Tomorrow-1000  . QUEtiapine  50 mg Oral QHS  . sodium chloride flush  3 mL Intravenous Q12H    Assessment: Patient is a 19 yom admitted for SOB and was on  Eliquis for afib present on admission (CHADs2VASC score is 3 (CAD/HTN/DM)). Last dose of Eliquis was 11/15 at 0817.   CBC on 11/17 shows a H&H of 11.8//38.5 and wnl plts.  Heparin level therapeutic at 0.44.  Goal of Therapy:  Heparin level 0.3-0.7 units/ml Monitor platelets by anticoagulation protocol: Yes   Plan:  Continue heparin gtt 2000 units/hr Daily HL and CBC Continue to monitor H&H and platelets   Thank you,   Lorel Monaco, PharmD PGY1 Ambulatory Care Resident [employer] # [phone]  Please check amion for clinical pharmacist contact number 10/14/2019,10:22 AM

## 2019-10-14 NOTE — Care Management Important Message (Signed)
Important Message  Patient Details  Name: [name] MRN: [id number] Date of Birth: 10/16/1961   Medicare Important Message Given:  Yes     Malee Grays [address] 10/14/2019, 11:32 AM

## 2019-10-14 NOTE — H&P (View-Only) (Signed)
Progress Note  Patient Name: [name] Date of Encounter: 10/14/2019  Primary Cardiologist: No primary care provider on file. Dr. Radford Pax  Subjective   Feels ok.  Able to lie flat.  Ready or cath.  Inpatient Medications    Scheduled Meds: . aspirin  81 mg Oral Daily  . atorvastatin  40 mg Oral q1800  . carvedilol  6.25 mg Oral BID WC  . furosemide  40 mg Intravenous Q12H  . insulin aspart  0-15 Units Subcutaneous TID WC  . insulin aspart  6 Units Subcutaneous TID WC  . insulin aspart protamine- aspart  10 Units Subcutaneous BID WC  . mouth rinse  15 mL Mouth Rinse BID  . pantoprazole  40 mg Oral Daily  . pneumococcal 23 valent vaccine  0.5 mL Intramuscular Tomorrow-1000  . QUEtiapine  50 mg Oral QHS  . sodium chloride flush  3 mL Intravenous Q12H   Continuous Infusions: . sodium chloride    . sodium chloride 1,000 mL (10/13/19 0549)  . sodium chloride    . sodium chloride 50 mL/hr at 10/14/19 0951  . azithromycin 500 mg (10/14/19 0631)  . cefTRIAXone (ROCEPHIN)  IV 2 g (10/13/19 2020)  . heparin 2,000 Units/hr (10/14/19 0222)   PRN Meds: sodium chloride, sodium chloride, sodium chloride, acetaminophen, guaiFENesin, ipratropium-albuterol, nitroGLYCERIN, ondansetron (ZOFRAN) IV, sodium chloride flush, sodium chloride flush   Vital Signs    Vitals:   10/13/19 1200 10/13/19 2014 10/14/19 0520 10/14/19 0924  BP: 131/84 (!) 148/105 (!) 146/81 128/83  Pulse: 86 60 84 (!) 58  Resp: 18 20 20 20   Temp: 97.9 F (36.6 C) (!) 97.5 F (36.4 C) 98.5 F (36.9 C) 98.6 F (37 C)  TempSrc: Oral Oral Oral Oral  SpO2: 93% 97% 93% 97%  Weight:   129.6 kg   Height:        Intake/Output Summary (Last 24 hours) at 10/14/2019 0958 Last data filed at 10/14/2019 0954 Gross per 24 hour  Intake 1672.03 ml  Output 4485 ml  Net -2812.97 ml   Last 3 Weights 10/14/2019 10/13/2019 10/12/2019  Weight (lbs) 285 lb 11.2 oz 288 lb 11.2 oz 288 lb  Weight (kg) 129.593 kg  130.953 kg 130.636 kg  Some encounter information is confidential and restricted. Go to Review Flowsheets activity to see all data.      Telemetry    NSR- Personally Reviewed  ECG      Physical Exam   GEN: No acute distress.   Neck: No JVD Cardiac: RRR, no murmurs, rubs, or gallops.  Respiratory: Clear to auscultation bilaterally. GI: Soft, nontender, non-distended  MS: No edema; No deformity. Neuro:  Nonfocal  Psych: Normal affect   Labs    High Sensitivity Troponin:   Recent Labs  Lab 10/11/19 1603 10/11/19 1841 10/12/19 0351 10/12/19 0545  TROPONINIHS 74* 68* 51* 54*      Chemistry Recent Labs  Lab 10/12/19 0545 10/13/19 0523 10/14/19 0317  NA 139 139 142  K 4.0 4.0 4.1  CL 104 102 101  CO2 24 29 29   GLUCOSE 315* 176* 114*  BUN 15 20 19   CREATININE 0.99 1.16 1.13  CALCIUM 8.7* 8.7* 8.7*  GFRNONAA >60 >60 >60  GFRAA >60 >60 >60  ANIONGAP 11 8 12      Hematology Recent Labs  Lab 10/11/19 1603 10/13/19 0523 10/14/19 0317  WBC 10.5 8.7 7.4  RBC 4.86 4.58 4.54  HGB 12.5* 12.0* 11.8*  HCT 41.5 39.8 38.5*  MCV  85.4 86.9 84.8  MCH 25.7* 26.2 26.0  MCHC 30.1 30.2 30.6  RDW 15.0 15.6* 15.0  PLT 317 350 345    BNP Recent Labs  Lab 10/11/19 1603  BNP 1,202.2*     DDimer No results for input(s): DDIMER in the last 168 hours.   Radiology    No results found.  Cardiac Studies   EF decreased  Patient Profile     58 y.o. male with acute systolic heart fialure  Assessment & Plan    Plan for R/L HC.  He has been off of Eliquis since Sunday night.  All questions about cath answered.  WIll need aggressive medical therapy for LV dysfunction.  Carvedilol started.  WIll need ACE-I and possibly oral diuretics, post cath.      For questions or updates, please contact [place] Please consult [email or web address] for contact info under        Signed, Larae Grooms, MD  10/14/2019, 9:58 AM

## 2019-10-14 NOTE — Progress Notes (Signed)
Patient back in room from cath lab in NAD.

## 2019-10-14 NOTE — Progress Notes (Signed)
Transitions of Care Pharmacist Note  [name] is a 58 y.o. male that has been diagnosed with A Fib and will be prescribed Eliquis (apixaban) at discharge.   Patient Education: I provided the following education on apixaban to the patient: How to take the medication Described what the medication is Signs of bleeding Signs/symptoms of VTE and stroke   Discharge Medications Plan: The patient wants to have their discharge medications filled by the Transitions of Care pharmacy rather than their usual pharmacy.  The discharge orders pharmacy has been changed to the Transitions of Care pharmacy, the patient will receive a phone call regarding co-pay, and their medications will be delivered by the Transitions of Care pharmacy.    Thank you,   Acey Lav, PharmD  PGY1 Acute Care Pharmacy Resident [phone] October 14, 2019

## 2019-10-14 NOTE — Research (Signed)
PHDE Informed Consent    Patient Name: [name]   Subject met inclusion and exclusion criteria.  The informed consent form, study requirements and expectations were reviewed with the subject and questions and concerns were addressed prior to the signing of the consent form.  The subject verbalized understanding of the trail requirements.  The subject agreed to participate in the PHDE trial and signed the informed consent.  The informed consent was obtained prior to performance of any protocol-specific procedures for the subject.  A copy of the signed informed consent was given to the subject and a copy was placed in the subject's medical record.   Neva Seat

## 2019-10-14 NOTE — Progress Notes (Signed)
PROGRESS NOTE    [name]  [name] DOB: [birth year] DOA: 10/11/2019 PCP: Nolene Ebbs, MD    Brief Narrative:  58 year old gentleman with history of coronary artery disease status post PCI in 2012, type 2 diabetes, hypertension, schizophrenia, noncompliance issues and currently without maintenance health care admitted to the hospital with increasing shortness of breath and intermittent chest pain for more than a month.  According to the patient he has not taken any medication for last 7 months at least.  In the emergency room he was found with hypoxic 81% on walking, leg edema, proBNP more than thousand.  Chest x-ray also suggestive of possible pneumonia.  Admitted with cardiology consultation. Suspected to have A. Fib, probably atrial tachycardia.    Assessment & Plan:   Active Problems:   CHF (congestive heart failure) (HCC)  Acute CHF: systolic HF . EF 25%. Good response to IV diuresis.  Continue IV diuresis.  Started on carvedilol.  .Intake output monitoring.  Followed by cardiology.  For cardiac cath today for ischemic evaluation.  ?  New onset A. fib: Probably atrial tachycardia.  Now sinus rhythm.  Was on Eliquis, stopped, started heparin.  Rate controlled.  May need outpatient monitoring.  Elevated troponin: Mild elevated troponin probably due to tachycardia. May need ischemic evaluation given CHF.  Going for cath today.  Community acquired pneumonia: stabilizing. Cultures negative so far.  Continue chest physiotherapy.  Currently on Rocephin and azithromycin.we will continue. Symptoms due to heart failure? Will continue 5 days of abx. Breathing exercises and chest physiotherapy. Change to oral after procedure.  Hyperlipidemia: On a statin.  Type 2 diabetes with hyperglycemia: Currently untreated.  A1c 11.  Previously on Metformin.  Patient will probably need insulin on discharge.  Keeping on 70/30 insulin and prandial insulin.  He is n.p.o. today.  Blood  sugars are stable.  Will uptitrate as needed.  He will need all new medication prescription on discharge.  Hypertension: Blood pressures are stable.   DVT prophylaxis: Heparin drip Code Status: Full code Family Communication: None Disposition Plan: Home after hospitalization   Consultants:   Cardiology  Procedures:   None  Antimicrobials:   Rocephin, azithromycin, 10/11/2019---   Subjective: Patient seen and examined.  No overnight events.  Sinus on telemetry.  Some chest discomfort on walking.  Objective: Vitals:   10/13/19 1200 10/13/19 2014 10/14/19 0520 10/14/19 0924  BP: 131/84 (!) 148/105 (!) 146/81 128/83  Pulse: 86 60 84 (!) 58  Resp: 18 20 20 20   Temp: 97.9 F (36.6 C) (!) 97.5 F (36.4 C) 98.5 F (36.9 C) 98.6 F (37 C)  TempSrc: Oral Oral Oral Oral  SpO2: 93% 97% 93% 97%  Weight:   129.6 kg   Height:        Intake/Output Summary (Last 24 hours) at 10/14/2019 1121 Last data filed at 10/14/2019 0954 Gross per 24 hour  Intake 1672.03 ml  Output 3785 ml  Net -2112.97 ml   Filed Weights   10/12/19 0011 10/13/19 0444 10/14/19 0520  Weight: 130.6 kg 131 kg 129.6 kg    Examination:  General exam: Appears calm and comfortable, on 2 L oxygen. Respiratory system: Clear to auscultation. Respiratory effort normal.  No added sound. Cardiovascular system: S1 & S2 heard, RRR.  Tachycardic.  No JVD, murmurs, rubs, gallops or clicks.  Trace bilateral pedal edema.   Gastrointestinal system: Abdomen is nondistended, soft and nontender. No organomegaly or masses felt. Normal bowel sounds heard. Obese and pendulous.  Central  nervous system: Alert and oriented. No focal neurological deficits. Extremities: Symmetric 5 x 5 power. Skin: No rashes, lesions or ulcers Psychiatry: Judgement and insight appear normal. Mood & affect appropriate.     Data Reviewed: I have personally reviewed following labs and imaging studies  CBC: Recent Labs  Lab 10/11/19 1603  10/13/19 0523 10/14/19 0317  WBC 10.5 8.7 7.4  NEUTROABS 8.7*  --   --   HGB 12.5* 12.0* 11.8*  HCT 41.5 39.8 38.5*  MCV 85.4 86.9 84.8  PLT 317 350 [id number]   Basic Metabolic Panel: Recent Labs  Lab 10/11/19 1603 10/12/19 0545 10/13/19 0523 10/14/19 0317  NA 136 139 139 142  K 3.2* 4.0 4.0 4.1  CL 103 104 102 101  CO2 22 24 29 29   GLUCOSE 239* 315* 176* 114*  BUN 8 15 20 19   CREATININE 0.83 0.99 1.16 1.13  CALCIUM 8.4* 8.7* 8.7* 8.7*  MG  --  2.0 1.8  --   PHOS  --  3.7 3.8  --    GFR: Estimated Creatinine Clearance: 100.8 mL/min (by C-G formula based on SCr of 1.13 mg/dL). Liver Function Tests: No results for input(s): AST, ALT, ALKPHOS, BILITOT, PROT, ALBUMIN in the last 168 hours. No results for input(s): LIPASE, AMYLASE in the last 168 hours. No results for input(s): AMMONIA in the last 168 hours. Coagulation Profile: No results for input(s): INR, PROTIME in the last 168 hours. Cardiac Enzymes: No results for input(s): CKTOTAL, CKMB, CKMBINDEX, TROPONINI in the last 168 hours. BNP (last 3 results) No results for input(s): PROBNP in the last 8760 hours. HbA1C: Recent Labs    10/12/19 0351  HGBA1C 11.0*   CBG: Recent Labs  Lab 10/13/19 0613 10/13/19 1120 10/13/19 1646 10/13/19 2117 10/14/19 0611  GLUCAP 167* 141* 198* 116* 105*   Lipid Profile: No results for input(s): CHOL, HDL, LDLCALC, TRIG, CHOLHDL, LDLDIRECT in the last 72 hours. Thyroid Function Tests: Recent Labs    10/12/19 1026  TSH 1.741   Anemia Panel: No results for input(s): VITAMINB12, FOLATE, FERRITIN, TIBC, IRON, RETICCTPCT in the last 72 hours. Sepsis Labs: No results for input(s): PROCALCITON, LATICACIDVEN in the last 168 hours.  Recent Results (from the past 240 hour(s))  SARS CORONAVIRUS 2 (TAT 6-24 HRS) Nasopharyngeal Nasopharyngeal Swab     Status: None   Collection Time: 10/11/19  6:00 PM   Specimen: Nasopharyngeal Swab  Result Value Ref Range Status   SARS Coronavirus 2  NEGATIVE NEGATIVE Final    Comment: (NOTE) SARS-CoV-2 target nucleic acids are NOT DETECTED. The SARS-CoV-2 RNA is generally detectable in upper and lower respiratory specimens during the acute phase of infection. Negative results do not preclude SARS-CoV-2 infection, do not rule out co-infections with other pathogens, and should not be used as the sole basis for treatment or other patient management decisions. Negative results must be combined with clinical observations, patient history, and epidemiological information. The expected result is Negative. Fact Sheet for Patients: SugarRoll.be Fact Sheet for Healthcare Providers: https://www.woods-mathews.com/ This test is not yet approved or cleared by the [place] FDA and  has been authorized for detection and/or diagnosis of SARS-CoV-2 by FDA under an Emergency Use Authorization (EUA). This EUA will remain  in effect (meaning this test can be used) for the duration of the COVID-19 declaration under Section 56 4(b)(1) of the Act, 21 U.S.C. section 360bbb-3(b)(1), unless the authorization is terminated or revoked sooner. Performed at [facility], [place] [address] [place], [place]  [id number]   Sputum culture     Status: None   Collection Time: 10/12/19  7:00 AM   Specimen: Sputum  Result Value Ref Range Status   Specimen Description SPUTUM  Final   Special Requests NONE  Final   Sputum evaluation   Final    THIS SPECIMEN IS ACCEPTABLE FOR SPUTUM CULTURE Performed at [facility], [address], [place], [place] [address]    Report Status 10/12/2019 FINAL  Final  Culture, respiratory     Status: None (Preliminary result)   Collection Time: 10/12/19  7:00 AM   Specimen: SPU  Result Value Ref Range Status   Specimen Description SPUTUM  Final   Special Requests NONE Reflexed from [id number]  Final   Gram Stain   Final    MODERATE WBC PRESENT, PREDOMINANTLY PMN ABUNDANT GRAM  POSITIVE COCCI IN PAIRS IN CLUSTERS MODERATE GRAM NEGATIVE RODS MODERATE GRAM POSITIVE RODS    Culture   Final    FEW Consistent with normal respiratory flora. Performed at [facility], [place] [address] [place], [place] [address]    Report Status PENDING  Incomplete         Radiology Studies: No results found.      Scheduled Meds: . aspirin  81 mg Oral Daily  . atorvastatin  40 mg Oral q1800  . carvedilol  6.25 mg Oral BID WC  . furosemide  40 mg Intravenous Q12H  . insulin aspart  0-15 Units Subcutaneous TID WC  . insulin aspart  6 Units Subcutaneous TID WC  . insulin aspart protamine- aspart  10 Units Subcutaneous BID WC  . mouth rinse  15 mL Mouth Rinse BID  . pantoprazole  40 mg Oral Daily  . pneumococcal 23 valent vaccine  0.5 mL Intramuscular Tomorrow-1000  . QUEtiapine  50 mg Oral QHS  . sodium chloride flush  3 mL Intravenous Q12H   Continuous Infusions: . sodium chloride    . sodium chloride 1,000 mL (10/13/19 0549)  . sodium chloride    . sodium chloride 50 mL/hr at 10/14/19 0951  . azithromycin 500 mg (10/14/19 0631)  . cefTRIAXone (ROCEPHIN)  IV 2 g (10/13/19 2020)  . heparin 2,000 Units/hr (10/14/19 0222)     LOS: 3 days    Time spent: 30 minutes    Barb Merino, MD [facility] Pager [phone]

## 2019-10-14 NOTE — TOC Initial Note (Signed)
Transition of Care [facility]) - Initial/Assessment Note    Patient Details  Name: [name] MRN: [id number] Date of Birth: Aug 24, 1961  [facility] CM/SW Contact:    Pollie Friar, RN Phone Number: 10/14/2019, 2:10 PM  Clinical Narrative:                 Pt requested CM consult for his Medicare. He only has part A. He states that is all provided in prison and since out hasnt gotten the B. CM provided him the number for Medicare insurance assistance. He will have to call and make changes to his medicare. CM did encourage him to also get a medication plan when he call about adding the B part.  TOC following for further d/c needs.   Expected Discharge Plan: Home/Self Care Barriers to Discharge: Continued Medical Work up, Inadequate or no insurance   Patient Goals and CMS Choice        Expected Discharge Plan and Services Expected Discharge Plan: Home/Self Care   Discharge Planning Services: CM Consult                                          Prior Living Arrangements/Services   Lives with:: Self Patient language and need for interpreter reviewed:: No Do you feel safe going back to the place where you live?: Yes      Need for Family Participation in Patient Care: No (Comment) Care giver support system in place?: No (comment)   Criminal Activity/Legal Involvement Pertinent to Current Situation/Hospitalization: No - Comment as needed  Activities of Daily Living Home Assistive Devices/Equipment: Dentures (specify type)(upper/lower) ADL Screening (condition at time of admission) Patient's cognitive ability adequate to safely complete daily activities?: Yes Is the patient deaf or have difficulty hearing?: No Does the patient have difficulty seeing, even when wearing glasses/contacts?: No Does the patient have difficulty concentrating, remembering, or making decisions?: No Patient able to express need for assistance with ADLs?: Yes Does the  patient have difficulty dressing or bathing?: No Independently performs ADLs?: Yes (appropriate for developmental age) Does the patient have difficulty walking or climbing stairs?: No Weakness of Legs: None Weakness of Arms/Hands: None  Permission Sought/Granted                  Emotional Assessment Appearance:: Appears stated age Attitude/Demeanor/Rapport: Engaged Affect (typically observed): Accepting, Pleasant Orientation: : Oriented to Self, Oriented to Place, Oriented to  Time, Oriented to Situation   Psych Involvement: No (comment)  Admission diagnosis:  New onset a-fib ([place]) [I48.91] Community acquired pneumonia, unspecified laterality [J18.9] Acute on chronic congestive heart failure, unspecified heart failure type [facility]) [I50.9] Patient Active Problem List   Diagnosis Date Noted  . CHF (congestive heart failure) ([place]) 10/11/2019  . Paranoid schizophrenia, chronic condition ([place]) 01/26/2015  . Severe recurrent major depressive disorder with psychotic features ([place]) 01/26/2015  . GAD (generalized anxiety disorder) 01/26/2015  . OCD (obsessive compulsive disorder) 01/26/2015  . Panic disorder without agoraphobia 01/26/2015  . Insomnia 01/26/2015   PCP:  Nolene Ebbs, MD Pharmacy:   [id number] - [place], [place] [address] Phone: [phone] Fax: [phone]     Social Determinants of Health (SDOH) Interventions    Readmission Risk Interventions No flowsheet data found.

## 2019-10-14 NOTE — Progress Notes (Signed)
Progress Note  Patient Name: [name] Date of Encounter: 10/14/2019  Primary Cardiologist: No primary care provider on file. Dr. Radford Pax  Subjective   Feels ok.  Able to lie flat.  Ready or cath.  Inpatient Medications    Scheduled Meds: . aspirin  81 mg Oral Daily  . atorvastatin  40 mg Oral q1800  . carvedilol  6.25 mg Oral BID WC  . furosemide  40 mg Intravenous Q12H  . insulin aspart  0-15 Units Subcutaneous TID WC  . insulin aspart  6 Units Subcutaneous TID WC  . insulin aspart protamine- aspart  10 Units Subcutaneous BID WC  . mouth rinse  15 mL Mouth Rinse BID  . pantoprazole  40 mg Oral Daily  . pneumococcal 23 valent vaccine  0.5 mL Intramuscular Tomorrow-1000  . QUEtiapine  50 mg Oral QHS  . sodium chloride flush  3 mL Intravenous Q12H   Continuous Infusions: . sodium chloride    . sodium chloride 1,000 mL (10/13/19 0549)  . sodium chloride    . sodium chloride 50 mL/hr at 10/14/19 0951  . azithromycin 500 mg (10/14/19 0631)  . cefTRIAXone (ROCEPHIN)  IV 2 g (10/13/19 2020)  . heparin 2,000 Units/hr (10/14/19 0222)   PRN Meds: sodium chloride, sodium chloride, sodium chloride, acetaminophen, guaiFENesin, ipratropium-albuterol, nitroGLYCERIN, ondansetron (ZOFRAN) IV, sodium chloride flush, sodium chloride flush   Vital Signs    Vitals:   10/13/19 1200 10/13/19 2014 10/14/19 0520 10/14/19 0924  BP: 131/84 (!) 148/105 (!) 146/81 128/83  Pulse: 86 60 84 (!) 58  Resp: 18 20 20 20   Temp: 97.9 F (36.6 C) (!) 97.5 F (36.4 C) 98.5 F (36.9 C) 98.6 F (37 C)  TempSrc: Oral Oral Oral Oral  SpO2: 93% 97% 93% 97%  Weight:   129.6 kg   Height:        Intake/Output Summary (Last 24 hours) at 10/14/2019 0958 Last data filed at 10/14/2019 0954 Gross per 24 hour  Intake 1672.03 ml  Output 4485 ml  Net -2812.97 ml   Last 3 Weights 10/14/2019 10/13/2019 10/12/2019  Weight (lbs) 285 lb 11.2 oz 288 lb 11.2 oz 288 lb  Weight (kg) 129.593 kg  130.953 kg 130.636 kg  Some encounter information is confidential and restricted. Go to Review Flowsheets activity to see all data.      Telemetry    NSR- Personally Reviewed  ECG      Physical Exam   GEN: No acute distress.   Neck: No JVD Cardiac: RRR, no murmurs, rubs, or gallops.  Respiratory: Clear to auscultation bilaterally. GI: Soft, nontender, non-distended  MS: No edema; No deformity. Neuro:  Nonfocal  Psych: Normal affect   Labs    High Sensitivity Troponin:   Recent Labs  Lab 10/11/19 1603 10/11/19 1841 10/12/19 0351 10/12/19 0545  TROPONINIHS 74* 68* 51* 54*      Chemistry Recent Labs  Lab 10/12/19 0545 10/13/19 0523 10/14/19 0317  NA 139 139 142  K 4.0 4.0 4.1  CL 104 102 101  CO2 24 29 29   GLUCOSE 315* 176* 114*  BUN 15 20 19   CREATININE 0.99 1.16 1.13  CALCIUM 8.7* 8.7* 8.7*  GFRNONAA >60 >60 >60  GFRAA >60 >60 >60  ANIONGAP 11 8 12      Hematology Recent Labs  Lab 10/11/19 1603 10/13/19 0523 10/14/19 0317  WBC 10.5 8.7 7.4  RBC 4.86 4.58 4.54  HGB 12.5* 12.0* 11.8*  HCT 41.5 39.8 38.5*  MCV  85.4 86.9 84.8  MCH 25.7* 26.2 26.0  MCHC 30.1 30.2 30.6  RDW 15.0 15.6* 15.0  PLT 317 350 345    BNP Recent Labs  Lab 10/11/19 1603  BNP 1,202.2*     DDimer No results for input(s): DDIMER in the last 168 hours.   Radiology    No results found.  Cardiac Studies   EF decreased  Patient Profile     58 y.o. male with acute systolic heart fialure  Assessment & Plan    Plan for R/L HC.  He has been off of Eliquis since Sunday night.  All questions about cath answered.  WIll need aggressive medical therapy for LV dysfunction.  Carvedilol started.  WIll need ACE-I and possibly oral diuretics, post cath.      For questions or updates, please contact [place] Please consult [email or web address] for contact info under        Signed, Larae Grooms, MD  10/14/2019, 9:58 AM

## 2019-10-15 ENCOUNTER — Telehealth: Payer: Self-pay | Admitting: Cardiology

## 2019-10-15 ENCOUNTER — Encounter (HOSPITAL_COMMUNITY): Payer: Self-pay | Admitting: Interventional Cardiology

## 2019-10-15 DIAGNOSIS — I25118 Atherosclerotic heart disease of native coronary artery with other forms of angina pectoris: Secondary | ICD-10-CM

## 2019-10-15 DIAGNOSIS — I480 Paroxysmal atrial fibrillation: Secondary | ICD-10-CM

## 2019-10-15 DIAGNOSIS — I509 Heart failure, unspecified: Secondary | ICD-10-CM

## 2019-10-15 LAB — CBC
HCT: 38.2 % — ABNORMAL LOW (ref 39.0–52.0)
Hemoglobin: 11.9 g/dL — ABNORMAL LOW (ref 13.0–17.0)
MCH: 26.2 pg (ref 26.0–34.0)
MCHC: 31.2 g/dL (ref 30.0–36.0)
MCV: 84 fL (ref 80.0–100.0)
Platelets: 349 10*3/uL (ref 150–400)
RBC: 4.55 MIL/uL (ref 4.22–5.81)
RDW: 14.8 % (ref 11.5–15.5)
WBC: 5.1 10*3/uL (ref 4.0–10.5)
nRBC: 0 % (ref 0.0–0.2)

## 2019-10-15 LAB — BASIC METABOLIC PANEL
Anion gap: 9 (ref 5–15)
BUN: 15 mg/dL (ref 6–20)
CO2: 27 mmol/L (ref 22–32)
Calcium: 8.1 mg/dL — ABNORMAL LOW (ref 8.9–10.3)
Chloride: 102 mmol/L (ref 98–111)
Creatinine, Ser: 1 mg/dL (ref 0.61–1.24)
GFR calc Af Amer: >60 mL/min (ref >60)
GFR calc non Af Amer: >60 mL/min (ref >60)
Glucose, Bld: 200 mg/dL — ABNORMAL HIGH (ref 70–99)
Potassium: 3.1 mmol/L — ABNORMAL LOW (ref 3.5–5.1)
Sodium: 138 mmol/L (ref 135–145)

## 2019-10-15 LAB — GLUCOSE, CAPILLARY
Glucose-Capillary: 148 mg/dL — ABNORMAL HIGH (ref 70–99)
Glucose-Capillary: 100 mg/dL — ABNORMAL HIGH (ref 70–99)
Glucose-Capillary: 186 mg/dL — ABNORMAL HIGH (ref 70–99)
Glucose-Capillary: 251 mg/dL — ABNORMAL HIGH (ref 70–99)

## 2019-10-15 MED ORDER — CEPHALEXIN 500 MG PO CAPS
500.0000 mg | ORAL_CAPSULE | Freq: Three times a day (TID) | ORAL | Status: DC
  Administered 2019-10-15 – 2019-10-16 (×4): 500 mg via ORAL
  Filled 2019-10-15 (×4): qty 1

## 2019-10-15 MED ORDER — FUROSEMIDE 40 MG PO TABS
40.0000 mg | ORAL_TABLET | Freq: Every day | ORAL | Status: DC
  Administered 2019-10-16: 09:00:00 40 mg via ORAL
  Filled 2019-10-15: qty 1

## 2019-10-15 MED ORDER — POTASSIUM CHLORIDE CRYS ER 20 MEQ PO TBCR
40.0000 meq | EXTENDED_RELEASE_TABLET | Freq: Two times a day (BID) | ORAL | Status: DC
  Administered 2019-10-15 – 2019-10-16 (×3): 40 meq via ORAL
  Filled 2019-10-15 (×3): qty 2

## 2019-10-15 MED ORDER — INSULIN ASPART PROT & ASPART (70-30 MIX) 100 UNIT/ML ~~LOC~~ SUSP
15.0000 [IU] | Freq: Two times a day (BID) | SUBCUTANEOUS | Status: DC
  Administered 2019-10-15 – 2019-10-16 (×2): 15 [IU] via SUBCUTANEOUS
  Filled 2019-10-15 (×2): qty 10

## 2019-10-15 MED ORDER — SACUBITRIL-VALSARTAN 24-26 MG PO TABS
1.0000 | ORAL_TABLET | Freq: Two times a day (BID) | ORAL | Status: DC
  Administered 2019-10-15 – 2019-10-16 (×2): 1 via ORAL
  Filled 2019-10-15 (×4): qty 1

## 2019-10-15 MED ORDER — INSULIN STARTER KIT- PEN NEEDLES (ENGLISH)
1.0000 | Freq: Once | Status: DC
  Filled 2019-10-15: qty 1

## 2019-10-15 MED ORDER — AZITHROMYCIN 500 MG PO TABS
500.0000 mg | ORAL_TABLET | Freq: Every day | ORAL | Status: DC
  Administered 2019-10-16: 500 mg via ORAL
  Filled 2019-10-15: qty 1

## 2019-10-15 NOTE — Progress Notes (Signed)
PROGRESS NOTE    [name]  [id number] DOB: 12-07-1960 DOA: 10/11/2019 PCP: Nolene Ebbs, MD    Brief Narrative:  58 year old gentleman with history of coronary artery disease status post PCI in 2012, type 2 diabetes, hypertension, schizophrenia, noncompliance issues and currently without maintenance health care admitted to the hospital with increasing shortness of breath and intermittent chest pain for more than a month.  According to the patient he has not taken any medication for last 7 months at least.  In the emergency room he was found with hypoxic 81% on walking, leg edema, proBNP more than thousand.  Chest x-ray also suggestive of possible pneumonia.  Admitted with cardiology consultation. Suspected to have A. Fib with irregular rhythm on tele.     Assessment & Plan:   Principal Problem:   CHF (congestive heart failure) (HCC)  Acute systolic congestive heart failure: EF 25%.  Good response to IV diuresis.  Continue IV diuresis.  Potassium replacement.  Intake output monitoring.  Followed by cardiology.  Started on carvedilol and tolerating well.  Renal function stable.  Followed by heart failure team, will defer to them for starting advanced heart failure therapies. Underwent cardiac cath 10/14/2019, distal disease, main arteries were patent.  New onset A. fib: Thought to be atrial tachycardia.  Patient does have episodic tachycardia with irregular rhythm PVCs.  Treated with heparin.  Now on Eliquis.  On beta-blockers.  Rate controlled now.  Elevated troponin: Mild elevated troponin probably due to tachycardia.  No more chest pain.  Community acquired pneumonia:  Clinically improved.   Changed to 3 more days of oral antibiotics with Keflex and azithromycin.    Hyperlipidemia: On statin.  Type 2 diabetes with hyperglycemia: Currently untreated.  A1c 11.  Previously on Metformin.  Will need insulin on discharge.  Consult diabetic educator, teach insulin  techniques.  Started on 70/30 insulin and prandial insulin.  Blood sugars are stabilized.  Will find out insurance coverage, hoping to discharge him on twice a day long-acting insulin along with prandial insulin and further follow-up outpatient to titrate off.  Hypertension: Blood pressures are stable.   DVT prophylaxis: Eliquis Code Status: Full code Family Communication: None Disposition Plan: Home after hospitalization.  Anticipate tomorrow if stable.   Consultants:   Cardiology  Procedures:   None  Antimicrobials:   Rocephin, azithromycin, 10/11/2019--- 11/17  Keflex and azithromycin, 10/15/2019----   Subjective: Seen and examined.  No overnight events.  No more chest pain.  On room air.  Trying to walk around, no shortness of breath.  He was able to lie flat.  He still has some dry cough.  Afebrile. Telemetry shows episode of tachycardia with PVCs.  Objective: Vitals:   10/14/19 1958 10/14/19 2317 10/15/19 0410 10/15/19 0900  BP: 129/73 103/89 115/77 (!) 146/91  Pulse: 66 73 77 81  Resp: 18 18 19 18   Temp: 98 F (36.7 C) 98.7 F (37.1 C) 98 F (36.7 C) 98.2 F (36.8 C)  TempSrc: Oral Oral Oral Oral  SpO2: 95% 98% 98% 96%  Weight:   130.3 kg   Height:        Intake/Output Summary (Last 24 hours) at 10/15/2019 0915 Last data filed at 10/14/2019 2100 Gross per 24 hour  Intake 1513.37 ml  Output 2350 ml  Net -836.63 ml   Filed Weights   10/13/19 0444 10/14/19 0520 10/15/19 0410  Weight: 131 kg 129.6 kg 130.3 kg    Examination:  General exam: Appears calm and comfortable, on  room air now. Respiratory system: Clear to auscultation. Respiratory effort normal.  No added sound. Cardiovascular system: S1 & S2 heard, RRR.  Tachycardic.  No JVD, murmurs, rubs, gallops or clicks.  No edema.  On compression stockings. Gastrointestinal system: Abdomen is nondistended, soft and nontender. No organomegaly or masses felt. Normal bowel sounds heard. Obese and  pendulous.  Central nervous system: Alert and oriented. No focal neurological deficits. Extremities: Symmetric 5 x 5 power. Skin: No rashes, lesions or ulcers Psychiatry: Judgement and insight appear normal. Mood & affect appropriate.     Data Reviewed: I have personally reviewed following labs and imaging studies  CBC: Recent Labs  Lab 10/11/19 1603 10/13/19 0523 10/14/19 0317 10/14/19 1501 10/14/19 1503 10/15/19 0327  WBC 10.5 8.7 7.4  --   --  5.1  NEUTROABS 8.7*  --   --   --   --   --   HGB 12.5* 12.0* 11.8* 12.2* 12.2* 11.9*  HCT 41.5 39.8 38.5* 36.0* 36.0* 38.2*  MCV 85.4 86.9 84.8  --   --  84.0  PLT 317 350 345  --   --  0000000   Basic Metabolic Panel: Recent Labs  Lab 10/11/19 1603 10/12/19 0545 10/13/19 0523 10/14/19 0317 10/14/19 1501 10/14/19 1503 10/15/19 0327  NA 136 139 139 142 142 141 138  K 3.2* 4.0 4.0 4.1 3.0* 3.0* 3.1*  CL 103 104 102 101  --   --  102  CO2 22 24 29 29   --   --  27  GLUCOSE 239* 315* 176* 114*  --   --  200*  BUN 8 15 20 19   --   --  15  CREATININE 0.83 0.99 1.16 1.13  --   --  1.00  CALCIUM 8.4* 8.7* 8.7* 8.7*  --   --  8.1*  MG  --  2.0 1.8  --   --   --   --   PHOS  --  3.7 3.8  --   --   --   --    GFR: Estimated Creatinine Clearance: 114.1 mL/min (by C-G formula based on SCr of 1 mg/dL). Liver Function Tests: No results for input(s): AST, ALT, ALKPHOS, BILITOT, PROT, ALBUMIN in the last 168 hours. No results for input(s): LIPASE, AMYLASE in the last 168 hours. No results for input(s): AMMONIA in the last 168 hours. Coagulation Profile: No results for input(s): INR, PROTIME in the last 168 hours. Cardiac Enzymes: No results for input(s): CKTOTAL, CKMB, CKMBINDEX, TROPONINI in the last 168 hours. BNP (last 3 results) No results for input(s): PROBNP in the last 8760 hours. HbA1C: No results for input(s): HGBA1C in the last 72 hours. CBG: Recent Labs  Lab 10/14/19 0611 10/14/19 1122 10/14/19 1659 10/14/19 2112  10/15/19 0603  GLUCAP 105* 121* 133* 162* 148*   Lipid Profile: No results for input(s): CHOL, HDL, LDLCALC, TRIG, CHOLHDL, LDLDIRECT in the last 72 hours. Thyroid Function Tests: Recent Labs    10/12/19 1026  TSH 1.741   Anemia Panel: No results for input(s): VITAMINB12, FOLATE, FERRITIN, TIBC, IRON, RETICCTPCT in the last 72 hours. Sepsis Labs: No results for input(s): PROCALCITON, LATICACIDVEN in the last 168 hours.  Recent Results (from the past 240 hour(s))  SARS CORONAVIRUS 2 (TAT 6-24 HRS) Nasopharyngeal Nasopharyngeal Swab     Status: None   Collection Time: 10/11/19  6:00 PM   Specimen: Nasopharyngeal Swab  Result Value Ref Range Status   SARS Coronavirus 2 NEGATIVE NEGATIVE Final  Comment: (NOTE) SARS-CoV-2 target nucleic acids are NOT DETECTED. The SARS-CoV-2 RNA is generally detectable in upper and lower respiratory specimens during the acute phase of infection. Negative results do not preclude SARS-CoV-2 infection, do not rule out co-infections with other pathogens, and should not be used as the sole basis for treatment or other patient management decisions. Negative results must be combined with clinical observations, patient history, and epidemiological information. The expected result is Negative. Fact Sheet for Patients: SugarRoll.be Fact Sheet for Healthcare Providers: https://www.woods-mathews.com/ This test is not yet approved or cleared by the [place] FDA and  has been authorized for detection and/or diagnosis of SARS-CoV-2 by FDA under an Emergency Use Authorization (EUA). This EUA will remain  in effect (meaning this test can be used) for the duration of the COVID-19 declaration under Section 56 4(b)(1) of the Act, 21 U.S.C. section 360bbb-3(b)(1), unless the authorization is terminated or revoked sooner. Performed at [facility], [place] [address], [place], [place] [address]   Sputum culture      Status: None   Collection Time: 10/12/19  7:00 AM   Specimen: Sputum  Result Value Ref Range Status   Specimen Description SPUTUM  Final   Special Requests NONE  Final   Sputum evaluation   Final    THIS SPECIMEN IS ACCEPTABLE FOR SPUTUM CULTURE Performed at [facility], [address], [place], [place] [address]    Report Status 10/12/2019 FINAL  Final  Culture, respiratory     Status: None   Collection Time: 10/12/19  7:00 AM   Specimen: SPU  Result Value Ref Range Status   Specimen Description SPUTUM  Final   Special Requests NONE Reflexed from [id number]  Final   Gram Stain   Final    MODERATE WBC PRESENT, PREDOMINANTLY PMN ABUNDANT GRAM POSITIVE COCCI IN PAIRS IN CLUSTERS MODERATE GRAM NEGATIVE RODS MODERATE GRAM POSITIVE RODS    Culture   Final    FEW Consistent with normal respiratory flora. Performed at [facility], [place] [address], [place], [place] [address]    Report Status 10/14/2019 FINAL  Final         Radiology Studies: No results found.      Scheduled Meds:  apixaban  5 mg Oral BID   aspirin  81 mg Oral Daily   atorvastatin  40 mg Oral q1800   [START ON 10/16/2019] azithromycin  500 mg Oral Daily   carvedilol  6.25 mg Oral BID WC   cephALEXin  500 mg Oral Q8H   furosemide  40 mg Intravenous Q12H   insulin aspart  0-15 Units Subcutaneous TID WC   insulin aspart  6 Units Subcutaneous TID WC   insulin aspart protamine- aspart  10 Units Subcutaneous BID WC   mouth rinse  15 mL Mouth Rinse BID   pantoprazole  40 mg Oral Daily   pneumococcal 23 valent vaccine  0.5 mL Intramuscular Tomorrow-1000   potassium chloride  40 mEq Oral BID   QUEtiapine  50 mg Oral QHS   sodium chloride flush  3 mL Intravenous Q12H   sodium chloride flush  3 mL Intravenous Q12H   Continuous Infusions:  sodium chloride       LOS: 4 days    Time spent: 30 minutes    Barb Merino, MD [facility] Pager [phone]

## 2019-10-15 NOTE — Progress Notes (Signed)
Patient will need the following Rxs at time of d/c home:  Relion 70/30 Insulin pens (from [facility])- Order # [id number]  Relion Insulin pen needles (from [facility])- Order # [id number]    Spoke w/ Dr. Sloan Leiter by phone regarding [name]'s CBGs and Insulin plan for d/c.  Decision made to keep discharge insulin plan simple and affordable.  Dr. Sloan Leiter gave me orders to D/C the Novolog 6 units TID with meals (prandial insulin) and Increase the 70/30 Insulin to 15 units BID with meals.  Orders placed into [facility].  Plan is to d/c home tomorrow with 70/30 Insulin BID with Breakfast and Dinner.  Patient can purchase 70/30 Insulin pens (box of 5) at [facility] for $43.  Insulin pen needles can also be purchased at [facility] for $9 for a box of 50.  DM Coordinator to see pt today and demonstrate how to use insulin pen at home.  RNs on unit to also allow pt to give all injections in hospital for practice.    Spoke w/ pt by phone today about his elevated A1c.  Explained what an A1c is and what it measures.  Reminded patient that his goal A1c is 7% or less per ADA standards to prevent both acute and long-term complications.  Explained to patient the extreme importance of good glucose control at home.  Encouraged patient to check his CBGs at least bid at home before taking his 70/30 Insulin and to record all CBGs in a logbook for his PCP to review.  Patient told me has taken insulin in the past but it was a long time ago.  OK with using insulin pens for home.  Encouraged pt to get his insulin and insulin pen needle Rxs filled at [facility] after d/c for lower cost.  Explained to pt what 70/30 insulin is and how to take.  Reminded pt that he needs to eat a meal when he takes this insulin.  Also reviewed signs and symptoms of hypoglycemia and how to treat hypo at home.    --Will follow patient during hospitalization--  Wyn Quaker RN, MSN, CDE Diabetes Coordinator Inpatient Glycemic Control Team Team Pager:  [phone] (8a-5p)

## 2019-10-15 NOTE — Progress Notes (Addendum)
Inpatient Diabetes Program Recommendations  AACE/ADA: New Consensus Statement on Inpatient Glycemic Control (2015)  Target Ranges:  Prepandial:   less than 140 mg/dL      Peak postprandial:   less than 180 mg/dL (1-2 hours)      Critically ill patients:  140 - 180 mg/dL   Results for [name], [name] (MRN [id number]) as of 10/15/2019 12:18  Ref. Range 10/13/2019 06:13 10/13/2019 11:20 10/13/2019 16:46 10/13/2019 21:17  Glucose-Capillary Latest Ref Range: 70 - 99 mg/dL 167 (H)  3 units NOVOLOG  141 (H)  8 units NOVOLOG  198 (H)  9 units NOVOLOG +  10 units 70/30 Insulin  116 (H)   Results for [name], [name] (MRN [id number]) as of 10/15/2019 12:18  Ref. Range 10/14/2019 06:11 10/14/2019 11:22 10/14/2019 16:59 10/14/2019 21:12  Glucose-Capillary Latest Ref Range: 70 - 99 mg/dL 105 (H)  70/30 Insulin HELD b/c pt NPO 121 (H) 133 (H)  8 units NOVOLOG +  10 units 70/30 Insulin 162 (H)   Results for [name], [name] (MRN [id number]) as of 10/15/2019 12:18  Ref. Range 10/15/2019 06:03 10/15/2019 12:16  Glucose-Capillary Latest Ref Range: 70 - 99 mg/dL 148 (H)  8 units NOVOLOG +  10 units 70/30 Insulin  100 (H)   Results for [name], [name] (MRN [id number]) as of 10/15/2019 12:18  Ref. Range 10/12/2019 03:51  Hemoglobin A1C Latest Ref Range: 4.8 - 5.6 % 11.0 (H)  (269 mg/dl)     Admit with: Acute CHF exacerbation/ New Onset A Fib  History: DM, Schizophrenia, Medication Noncompliance  Home DM Meds: Invokamet Daily       Onglyza 2.5 mg Daily  Current Orders: 70/30 Insulin 10 units BID      Novolog Moderate Correction Scale/ SSI (0-15 units) TID AC      Novolog 6 units TID     [facility] prison 7 months ago.  Difficulty obtaining meds per H&P notes.    MD- Please consider the following in-hospital insulin adjustments:  1. Stop Novolog 6 units TID with meals  Extra Meal Coverage not recommended to be given with 70/30 Insulin   2.  Continue Current Novolog SSI  3. At time of discharge, if insulin desired, recommend 70/30 Insulin BID with breakfast and supper.  Patient will more likely to be compliant with two shots of insulin per day versus 4 shots or more.  Plus, 70/30 Insulin is much less expensive than other insulins.  70/30 Insulin pens (box of 5) Can be purchased at [facility] for $43 out of pocket.  Recommend Stop the Novolog 6 units TID and Increase the 70/30 Insulin to 15 units BID with meals     --Will follow patient during hospitalization--  Wyn Quaker RN, MSN, CDE Diabetes Coordinator Inpatient Glycemic Control Team Team Pager: [phone] (8a-5p)

## 2019-10-15 NOTE — Telephone Encounter (Signed)
New message   Scheduled a TOC appt per Cecilie Kicks with [facility] on 10/27/19 at 9:00 am.

## 2019-10-15 NOTE — Progress Notes (Signed)
Inpatient Diabetes Program Recommendations  AACE/ADA: New Consensus Statement on Inpatient Glycemic Control (2015)  Target Ranges:  Prepandial:   less than 140 mg/dL      Peak postprandial:   less than 180 mg/dL (1-2 hours)      Critically ill patients:  140 - 180 mg/dL   Lab Results  Component Value Date   GLUCAP 100 (H) 10/15/2019   HGBA1C 11.0 (H) 10/12/2019    Educated patient on insulin pen use at home. Reviewed contents of insulin flexpen starter kit. Reviewed all steps of insulin pen including attachment of needle, 2-unit air shot, dialing up dose, giving injection, removing needle, disposal of sharps, storage of unused insulin, disposal of insulin etc. Patient able to provide successful return demonstration. Also reviewed troubleshooting with insulin pen. MD to give patient Rxs for insulin pens and insulin pen needles.  Reviewed hypoglycemia and treatment.  States he can afford 70/30 insulin at [employer].  He only has Medicare Part A but is working on getting B & D.  Discussed checking his CBG AC & HS and keeping a log for his PCP to review.  Reviewed limiting carbohydrates and increasing physical activity per MD recommendation. Ordered a pen needle starter kit.      Thank you, Geoffry Paradise, RN, BSN Diabetes Coordinator Inpatient Diabetes Program [phone] (team pager from 8a-5p)

## 2019-10-15 NOTE — TOC Progression Note (Addendum)
Transition of Care [facility]) - Progression Note    Patient Details  Name: ANDRIC HOTZ MRN: [id number] Date of Birth: [birth year]  Transition of Care [facility] CM/SW Contact  Zenon Mayo, RN Phone Number: 10/15/2019, 3:41 PM  Clinical Narrative:    From home, afib, has transportation issues, needs a scale-  only has Medicare A. Will need to add Medicare B and D during enrollment period which expires 11/03/19.  Will need eliquis 30 day coupon and Entresto 30 day coupon with  patient ast form with hospital follow up at clinic.  NCM made follow up apt at Patient [place] on 1/5 at 1 pm.  He will now go to this clinic to get established and get ast with medications at [facility] clinic.  NCM gave him eliquis Delene Loll patient ast application to fill out and informed [place] Staff RN to put the application on patient chart for MD to fill out.  MD will need to send meds to [place] prior to dc.  NCM brought scale up to patient's room.     Expected Discharge Plan: Home/Self Care Barriers to Discharge: Continued Medical Work up, Inadequate or no insurance  Expected Discharge Plan and Services Expected Discharge Plan: Home/Self Care   Discharge Planning Services: CM Consult                                           Social Determinants of Health (SDOH) Interventions    Readmission Risk Interventions No flowsheet data found.

## 2019-10-15 NOTE — Progress Notes (Addendum)
Progress Note  Patient Name: [name] Date of Encounter: 10/15/2019  Primary Cardiologist: [name], MD   Subjective   No chest pain and no SOB feels well asking about discharge.   Inpatient Medications    Scheduled Meds: . apixaban  5 mg Oral BID  . aspirin  81 mg Oral Daily  . atorvastatin  40 mg Oral q1800  . [START ON 10/16/2019] azithromycin  500 mg Oral Daily  . carvedilol  6.25 mg Oral BID WC  . cephALEXin  500 mg Oral Q8H  . furosemide  40 mg Oral Daily  . insulin aspart  0-15 Units Subcutaneous TID WC  . insulin aspart  6 Units Subcutaneous TID WC  . insulin aspart protamine- aspart  10 Units Subcutaneous BID WC  . mouth rinse  15 mL Mouth Rinse BID  . pantoprazole  40 mg Oral Daily  . pneumococcal 23 valent vaccine  0.5 mL Intramuscular Tomorrow-1000  . potassium chloride  40 mEq Oral BID  . QUEtiapine  50 mg Oral QHS  . sacubitril-valsartan  1 tablet Oral BID  . sodium chloride flush  3 mL Intravenous Q12H  . sodium chloride flush  3 mL Intravenous Q12H   Continuous Infusions: . sodium chloride     PRN Meds: sodium chloride, acetaminophen, guaiFENesin, ipratropium-albuterol, nitroGLYCERIN, ondansetron (ZOFRAN) IV, sodium chloride flush, sodium chloride flush   Vital Signs    Vitals:   10/14/19 1958 10/14/19 2317 10/15/19 0410 10/15/19 0900  BP: 129/73 103/89 115/77 (!) 146/91  Pulse: 66 73 77 81  Resp: 18 18 19 18   Temp: 98 F (36.7 C) 98.7 F (37.1 C) 98 F (36.7 C) 98.2 F (36.8 C)  TempSrc: Oral Oral Oral Oral  SpO2: 95% 98% 98% 96%  Weight:   130.3 kg   Height:        Intake/Output Summary (Last 24 hours) at 10/15/2019 1021 Last data filed at 10/15/2019 0855 Gross per 24 hour  Intake 1753.37 ml  Output 1275 ml  Net 478.37 ml   Last 3 Weights 10/15/2019 10/14/2019 10/13/2019  Weight (lbs) 287 lb 4.2 oz 285 lb 11.2 oz 288 lb 11.2 oz  Weight (kg) 130.3 kg 129.593 kg 130.953 kg  Some encounter information is  confidential and restricted. Go to Review Flowsheets activity to see all data.      Telemetry    SR with freq PACs also possible aberrancy with larger beats but same morphology.- Personally Reviewed  ECG    No new - Personally Reviewed  Physical Exam   GEN: No acute distress.   Neck: No JVD Cardiac: RRR, no murmurs, rubs, or gallops. Rt wrist cath site without hematoma.    Respiratory: Clear to auscultation bilaterally. GI: Soft, nontender, non-distended  MS: No edema; No deformity. Neuro:  Nonfocal  Psych: Normal affect   Labs    High Sensitivity Troponin:   Recent Labs  Lab 10/11/19 1603 10/11/19 1841 10/12/19 0351 10/12/19 0545  TROPONINIHS 74* 68* 51* 54*      Chemistry Recent Labs  Lab 10/13/19 0523 10/14/19 0317 10/14/19 1501 10/14/19 1503 10/15/19 0327  NA 139 142 142 141 138  K 4.0 4.1 3.0* 3.0* 3.1*  CL 102 101  --   --  102  CO2 29 29  --   --  27  GLUCOSE 176* 114*  --   --  200*  BUN 20 19  --   --  15  CREATININE 1.16 1.13  --   --  1.00  CALCIUM 8.7* 8.7*  --   --  8.1*  GFRNONAA >60 >60  --   --  >60  GFRAA >60 >60  --   --  >60  ANIONGAP 8 12  --   --  9     Hematology Recent Labs  Lab 10/13/19 0523 10/14/19 0317 10/14/19 1501 10/14/19 1503 10/15/19 0327  WBC 8.7 7.4  --   --  5.1  RBC 4.58 4.54  --   --  4.55  HGB 12.0* 11.8* 12.2* 12.2* 11.9*  HCT 39.8 38.5* 36.0* 36.0* 38.2*  MCV 86.9 84.8  --   --  84.0  MCH 26.2 26.0  --   --  26.2  MCHC 30.2 30.6  --   --  31.2  RDW 15.6* 15.0  --   --  14.8  PLT 350 345  --   --  349    BNP Recent Labs  Lab 10/11/19 1603  BNP 1,202.2*     DDimer No results for input(s): DDIMER in the last 168 hours.   Radiology    No results found.  Cardiac Studies   Rt and Lt cardiac cath  10/14/19  Known severe LV systolic dysfunction by echo with EF less than 25%.  Normal LVEDP 16 to 20 mmHg and mean pulmonary capillary wedge pressure 16 mmHg.  Widely patent LAD  First diagonal  with severe segmental stenoses.  Relatively small vessel.   Totally occluded second obtuse marginal which has been previously stented.  Fills late by collaterals, sluggishly.  First obtuse marginal is tiny and has proximal 90% stenosis.  RCA appears codominant, has an 85 to 90% proximal stenosis, and 60 to 70% proximal stenosis.  RECOMMENDATIONS:   Diffuse coronary disease as outlined above.  The entire LAD is widely patent.  Circumflex is patent but the first obtuse marginal has high-grade stenosis in the previously stented second obtuse marginal is totally occluded.  Seems medical therapy would be appropriate.  Aggressive medical therapy for systolic heart failure.   Echo 10/12/19 IMPRESSIONS    1. Left ventricular ejection fraction, by visual estimation, is 25 to 30%. The left ventricle has severely decreased function. There is no left ventricular hypertrophy.  2. Definity contrast agent was given IV to delineate the left ventricular endocardial borders.  3. Moderately dilated left ventricular internal cavity size.  4. The left ventricle demonstrates global hypokinesis.  5. Global right ventricle has normal systolic function.The right ventricular size is mildly enlarged. No increase in right ventricular wall thickness.  6. Left atrial size was normal.  7. Right atrial size was moderately dilated.  8. The mitral valve is normal in structure. No evidence of mitral valve regurgitation. No evidence of mitral stenosis.  9. The tricuspid valve is normal in structure. Tricuspid valve regurgitation is not demonstrated. 10. The aortic valve is normal in structure. Aortic valve regurgitation is trivial. No evidence of aortic valve sclerosis or stenosis. 11. The pulmonic valve was normal in structure. Pulmonic valve regurgitation is not visualized. 12. The inferior vena cava is normal in size with greater than 50% respiratory variability, suggesting right atrial pressure of 3 mmHg.   FINDINGS  Left Ventricle: Left ventricular ejection fraction, by visual estimation, is 25 to 30%. The left ventricle has severely decreased function. Definity contrast agent was given IV to delineate the left ventricular endocardial borders. The left ventricle  demonstrates global hypokinesis. The left ventricular internal cavity size was moderately dilated left ventricle. There is no  left ventricular hypertrophy. Normal left atrial pressure.  Right Ventricle: The right ventricular size is mildly enlarged. No increase in right ventricular wall thickness. Global RV systolic function is has normal systolic function.  Left Atrium: Left atrial size was normal in size.  Right Atrium: Right atrial size was moderately dilated  Pericardium: There is no evidence of pericardial effusion.  Mitral Valve: The mitral valve is normal in structure. No evidence of mitral valve stenosis by observation. No evidence of mitral valve regurgitation.  Tricuspid Valve: The tricuspid valve is normal in structure. Tricuspid valve regurgitation is not demonstrated.  Aortic Valve: The aortic valve is normal in structure. Aortic valve regurgitation is trivial. The aortic valve is structurally normal, with no evidence of sclerosis or stenosis.  Pulmonic Valve: The pulmonic valve was normal in structure. Pulmonic valve regurgitation is not visualized.  Aorta: The aortic root, ascending aorta and aortic arch are all structurally normal, with no evidence of dilitation or obstruction.  Venous: The inferior vena cava is normal in size with greater than 50% respiratory variability, suggesting right atrial pressure of 3 mmHg.  IAS/Shunts: No atrial level shunt detected by color flow Doppler. No ventricular septal defect is seen or detected. There is no evidence of an atrial septal defect.    Patient Profile     58 y.o. male with a hx of CADs/p PCI to [facility] in 2012, DM, tobacco use, schizophrenia, noncompliance  now admitted with dyspnea and dx with PNA and cardiomyopathy.  Prior pt of Dr. Terrence Dupont.   Assessment & Plan    Acute systolic HF /cardiomyopathy with EF 25-30%.   -- negative 4627 since admit and wt from 133.8 to 130.3  ( loss of 7.7 lbs) --Cr 1.00 today  --k+ 3.1 being replaced by IM --on lasix 40 mg po daily.  --entresto has been started plan for increase if labs stable in 2 weeks with office visit.  --on corge 6.25 BID     CAD patent LAD, First diagonal with severe segmental stenoses.  Relatively small vessel. Totally occluded second obtuse marginal which has been previously stented.  Fills late by collaterals, sluggishly.  First obtuse marginal is tiny and has proximal 90% stenosis.  RCA appears codominant, has an 85 to 90% proximal stenosis, and 60 to 70% proximal stenosis. On cath yesterday --medical therapy  On BB, ASA and eliquis, and statin.  (hx of remote PCI to [facility] in 2012.   Neg MI  Originally thought new onset a fib.  Now in sR.  Per Dr Radford Pax "I cannot find any evidence of atrial fibrillation.  There is NSR to ST with frequent PACs and possible MAT"  Pt now on eliqis and asa would stop eliquis  But will defer to Dr. Irish Lack   PNA per [facility]   HTN  Controlled yesterday but today 146/91    VT on the 15th over 6 sec.   HLD on statin but will need lipid panel as outpt.     ? Discharge today or tomorrow  No in pt appts available only virtual, pt without scales or BP cuff.  Will see if care manager can help arrange.        For questions or updates, please contact [place] Please consult [email or web address] for contact info under        Signed, Cecilie Kicks, NP  10/15/2019, 10:21 AM    I have examined the patient and reviewed assessment and plan and discussed with patient.  Agree with above as stated.  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  Clearly, the nonischemic component is predominant based on cath findings.  Start Entresto.   Switch oral Lasix 40 mg daily. COntinue Coreg. OK for discharge from a cardiac standpoint.    Larae Grooms

## 2019-10-15 NOTE — Discharge Instructions (Signed)
Symptoms of Hypoglycemia:  Silly, Sweaty, Shaky Check sugar if you have your meter.  If near or less than 70 mg/dl, treat with 1/2 cup juice or soda or take glucose tablets Check sugar 15 minutes after treatment.  If sugar still near or less than 70 mg/al and symptomatic, treat again and may need a snack with some protein (peanut butter with crackers, etc)   Call [facility] at [phone] if any bleeding, swelling or drainage at cath site.  May shower, no tub baths for 48 hours for groin sticks. No lifting over 5 pounds for 3 days.  No Driving for 3 days   Low salt, diabetic, heart healthy diet.    Weigh daily if possible and call cardiology office if wt up by 3 lbs in a day or 5 lbs in a week.           Information on my medicine - ELIQUIS (apixaban)  Why was Eliquis prescribed for you? Eliquis was prescribed for you to reduce the risk of forming blood clots that can cause a stroke if you have a medical condition called atrial fibrillation (a type of irregular heartbeat) OR to reduce the risk of a blood clots forming after orthopedic surgery.  What do You need to know about Eliquis ? Take your Eliquis TWICE DAILY - one tablet in the morning and one tablet in the evening with or without food.  It would be best to take the doses about the same time each day.  If you have difficulty swallowing the tablet whole please discuss with your pharmacist how to take the medication safely.  Take Eliquis exactly as prescribed by your doctor and DO NOT stop taking Eliquis without talking to the doctor who prescribed the medication.  Stopping may increase your risk of developing a new clot or stroke.  Refill your prescription before you run out.  After discharge, you should have regular check-up appointments with your healthcare provider that is prescribing your Eliquis.  In the future your dose may need to be changed if your kidney function or weight changes by a  significant amount or as you get older.  What do you do if you miss a dose? If you miss a dose, take it as soon as you remember on the same day and resume taking twice daily.  Do not take more than one dose of ELIQUIS at the same time.  Important Safety Information A possible side effect of Eliquis is bleeding. You should call your healthcare provider right away if you experience any of the following: ? Bleeding from an injury or your nose that does not stop. ? Unusual colored urine (red or dark brown) or unusual colored stools (red or black). ? Unusual bruising for unknown reasons. ? A serious fall or if you hit your head (even if there is no bleeding).  Some medicines may interact with Eliquis and might increase your risk of bleeding or clotting while on Eliquis. To help avoid this, consult your healthcare provider or pharmacist prior to using any new prescription or non-prescription medications, including herbals, vitamins, non-steroidal anti-inflammatory drugs (NSAIDs) and supplements.  This website has more information on Eliquis (apixaban): [email or web address].no.

## 2019-10-15 NOTE — Telephone Encounter (Signed)
10/11/2019 - present (4 days) [place]

## 2019-10-16 LAB — GLUCOSE, CAPILLARY
Glucose-Capillary: 166 mg/dL — ABNORMAL HIGH (ref 70–99)
Glucose-Capillary: 232 mg/dL — ABNORMAL HIGH (ref 70–99)
Glucose-Capillary: 127 mg/dL — ABNORMAL HIGH (ref 70–99)

## 2019-10-16 LAB — CBC
HCT: 36.9 % — ABNORMAL LOW (ref 39.0–52.0)
Hemoglobin: 11.2 g/dL — ABNORMAL LOW (ref 13.0–17.0)
MCH: 25.9 pg — ABNORMAL LOW (ref 26.0–34.0)
MCHC: 30.4 g/dL (ref 30.0–36.0)
MCV: 85.4 fL (ref 80.0–100.0)
Platelets: 322 10*3/uL (ref 150–400)
RBC: 4.32 MIL/uL (ref 4.22–5.81)
RDW: 14.8 % (ref 11.5–15.5)
WBC: 5.6 10*3/uL (ref 4.0–10.5)
nRBC: 0 % (ref 0.0–0.2)

## 2019-10-16 LAB — BASIC METABOLIC PANEL
Anion gap: 8 (ref 5–15)
BUN: 13 mg/dL (ref 6–20)
CO2: 28 mmol/L (ref 22–32)
Calcium: 8.3 mg/dL — ABNORMAL LOW (ref 8.9–10.3)
Chloride: 105 mmol/L (ref 98–111)
Creatinine, Ser: 0.94 mg/dL (ref 0.61–1.24)
GFR calc Af Amer: >60 mL/min (ref >60)
GFR calc non Af Amer: >60 mL/min (ref >60)
Glucose, Bld: 204 mg/dL — ABNORMAL HIGH (ref 70–99)
Potassium: 3.6 mmol/L (ref 3.5–5.1)
Sodium: 141 mmol/L (ref 135–145)

## 2019-10-16 LAB — PHOSPHORUS: Phosphorus: 3.5 mg/dL (ref 2.5–4.6)

## 2019-10-16 LAB — MAGNESIUM: Magnesium: 1.8 mg/dL (ref 1.7–2.4)

## 2019-10-16 MED ORDER — ATORVASTATIN CALCIUM 40 MG PO TABS: 40.0000 mg | ORAL_TABLET | Freq: Every day | ORAL | 0 refills | Status: DC

## 2019-10-16 MED ORDER — FUROSEMIDE 40 MG PO TABS: 40.0000 mg | ORAL_TABLET | Freq: Every day | ORAL | 0 refills | Status: DC

## 2019-10-16 MED ORDER — QUETIAPINE FUMARATE 50 MG PO TABS: 50.0000 mg | ORAL_TABLET | Freq: Every day | ORAL | 0 refills | Status: DC

## 2019-10-16 MED ORDER — PEN NEEDLES 30G X 5 MM MISC: 1.0000 | Freq: Two times a day (BID) | 0 refills | Status: DC

## 2019-10-16 MED ORDER — CARVEDILOL 6.25 MG PO TABS: 6.2500 mg | ORAL_TABLET | Freq: Two times a day (BID) | ORAL | 0 refills | Status: DC

## 2019-10-16 MED ORDER — INSULIN ASP PROT & ASP FLEXPEN (70-30) 100 UNIT/ML ~~LOC~~ SUPN: 15.0000 [IU] | PEN_INJECTOR | Freq: Two times a day (BID) | SUBCUTANEOUS | 0 refills | Status: DC

## 2019-10-16 MED ORDER — CEPHALEXIN 500 MG PO CAPS: 500.0000 mg | ORAL_CAPSULE | Freq: Three times a day (TID) | ORAL | 0 refills | Status: DC

## 2019-10-16 MED ORDER — APIXABAN 5 MG PO TABS: 5.0000 mg | ORAL_TABLET | Freq: Two times a day (BID) | ORAL | 2 refills | Status: DC

## 2019-10-16 MED ORDER — SACUBITRIL-VALSARTAN 24-26 MG PO TABS: 1.0000 | ORAL_TABLET | Freq: Two times a day (BID) | ORAL | 0 refills | Status: DC

## 2019-10-16 MED ORDER — ASPIRIN 81 MG PO CHEW: 81.0000 mg | CHEWABLE_TABLET | Freq: Every day | ORAL | 0 refills | Status: DC

## 2019-10-16 MED ORDER — CEPHALEXIN 500 MG PO CAPS: 500.0000 mg | ORAL_CAPSULE | Freq: Three times a day (TID) | ORAL | 0 refills | Status: AC

## 2019-10-16 MED ORDER — BLOOD GLUCOSE MONITOR KIT: PACK | 0 refills | Status: AC

## 2019-10-16 MED ORDER — APIXABAN 5 MG PO TABS: 5.0000 mg | ORAL_TABLET | Freq: Two times a day (BID) | ORAL | 0 refills | Status: DC

## 2019-10-16 MED ORDER — SACUBITRIL-VALSARTAN 24-26 MG PO TABS: 1.0000 | ORAL_TABLET | Freq: Two times a day (BID) | ORAL | 2 refills | Status: AC

## 2019-10-16 MED FILL — ELIQUIS 5 MG TABLET: 5 | 30 days supply | Qty: 60 | Fill #0

## 2019-10-16 MED FILL — QUETIAPINE FUMARATE 50 MG T: 50 | 30 days supply | Qty: 30 | Fill #0

## 2019-10-16 MED FILL — ATORVASTATIN CALCIUM 40 MG: 40 | 30 days supply | Qty: 30 | Fill #0

## 2019-10-16 MED FILL — ENTRESTO 24 MG-26 MG TABLET: 24-26 | 30 days supply | Qty: 60 | Fill #0

## 2019-10-16 MED FILL — CEPHALEXIN 500 MG CAPSULE: 500 | 2 days supply | Qty: 6 | Fill #0

## 2019-10-16 MED FILL — TRUEPLUS PEN NDL 31G X 1/4": 31G X 6 MM | 50 days supply | Qty: 100 | Fill #0

## 2019-10-16 MED FILL — CARVEDILOL 6.25 MG TABLET: 6.25 | 30 days supply | Qty: 60 | Fill #0

## 2019-10-16 MED FILL — FUROSEMIDE 40 MG TAB: 40 | 30 days supply | Qty: 30 | Fill #0

## 2019-10-16 MED FILL — TRUEPLUS PEN NDL 31G X 1/4: 31G X 6 MM | 50 days supply | Qty: 100 | Fill #0

## 2019-10-16 MED FILL — NOVOLOG MIX 70-30 FLEXPEN S: (70-30) 100 | 29 days supply | Qty: 9 | Fill #0

## 2019-10-16 NOTE — Progress Notes (Signed)
Pt will be discharged on eliquis but no need for ASA  Discussed with Dr. Irish Lack

## 2019-10-16 NOTE — Discharge Summary (Signed)
Physician Discharge Summary  [name] WEX:[id number] DOB: 1961-07-05 DOA: 10/11/2019  PCP: Nolene Ebbs, MD  Admit date: 10/11/2019 Discharge date: 10/16/2019  Admitted From: Home Disposition: Home  Recommendations for Outpatient Follow-up:  1. Follow up with PCP in 1-2 weeks 2. Please obtain BMP/CBC in one week 3. Follow-up with primary care physician and cardiology as scheduled.  Home Health: Not applicable Equipment/Devices: Not applicable  Discharge Condition: Stable CODE STATUS: Full code Diet recommendation: Low-salt low-carb diet  Discharge summary: 58 year old gentleman with history of coronary artery disease status post PCI in 2012, type 2 diabetes, hypertension, schizophrenia, noncompliance issues and currently without maintenance health care was admitted to the hospital with increasing shortness of breath and intermittent chest pain for more than a month.  According to the patient he had not taken any medication for last 7 months at least.  In the emergency room he was found with hypoxic 81% on walking, leg edema, proBNP more than thousand.  Chest x-ray also suggestive of possible pneumonia.  Admitted with cardiology consultation. Suspected to have A. Fib with irregular rhythm on tele.   Patient was treated for different medical issues as following:  Acute systolic congestive heart failure: EF 25%.  Good response to IV diuresis.    Now changed to oral Lasix.   As per cardiology recommendation, going home on Entresto, Coreg and Lasix.  Electrolytes are normal.  Renal functions are stable.  Patient will need outpatient labs and follow-up with cardiology.  Underwent cardiac cath, found to have distal disease with widely patent main coronary arteries.  Heart failure education, supplies given by the hospital.  New onset A. fib: Thought to be atrial tachycardia.  Patient does have episodic tachycardia with irregular rhythm PVCs. Treated with heparin.  Now on Eliquis.   On beta-blockers.  Rate controlled now.  Elevated troponin: Mild elevated troponin probably due to tachycardia.  No more chest pain.  Discharging with aspirin and Eliquis as per cardiology plan.  Community acquired pneumonia:  Clinically improved.  No positive cultures.  Received 4 days of antibiotics.  We will continue 2 more day of oral antibiotic on discharge.  Currently on room air and ambulating around.   Hyperlipidemia: On statin.  Type 2 diabetes with hyperglycemia: Currently untreated.  A1c 11.  Previously on Metformin.  In order to simplify his treatment regimen, patient was started on insulin 70/30 twice a day, discharged on twice daily insulin.  He will keep monitoring his blood sugars at home and bring it to doctor's office visit.  May need up titration.  Will benefit with addition of oral hypoglycemics if renal functions remain stable in the future.  Schizophrenia: Stable.  Not on treatment.  Patient requested to start Seroquel that he was doing in the past that was prescribed.  Patient is medically stable to be discharged today.    Discharge Diagnoses:  Principal Problem:   CHF (congestive heart failure) [facility])    Discharge Instructions  Discharge Instructions    Diet - low sodium heart healthy   Complete by: As directed    Diet Carb Modified   Complete by: As directed    Increase activity slowly   Complete by: As directed      Allergies as of 10/16/2019   No Known Allergies     Medication List    STOP taking these medications   aspirin 81 MG tablet Replaced by: aspirin 81 MG chewable tablet   escitalopram 20 MG tablet Commonly known as: Lexapro  zolpidem 10 MG tablet Commonly known as: AMBIEN     TAKE these medications   apixaban 5 MG Tabs tablet Commonly known as: ELIQUIS Take 1 tablet (5 mg total) by mouth 2 (two) times daily.   aspirin 81 MG chewable tablet Chew 1 tablet (81 mg total) by mouth daily. Replaces: aspirin 81 MG tablet    atorvastatin 40 MG tablet Commonly known as: LIPITOR Take 1 tablet (40 mg total) by mouth daily at 6 PM.   blood glucose meter kit and supplies Kit Dispense based on patient and insurance preference. Use up to four times daily as directed. (FOR ICD-9 250.00, 250.01).   carvedilol 6.25 MG tablet Commonly known as: COREG Take 1 tablet (6.25 mg total) by mouth 2 (two) times daily with a meal.   cephALEXin 500 MG capsule Commonly known as: KEFLEX Take 1 capsule (500 mg total) by mouth every 8 (eight) hours for 2 days.   furosemide 40 MG tablet Commonly known as: LASIX Take 1 tablet (40 mg total) by mouth daily.   Insulin Asp Prot & Asp FlexPen (70-30) 100 UNIT/ML Supn Inject 15 Units into the skin 2 (two) times daily before a meal.   Pen Needles 30G X 5 MM Misc 1 each by Does not apply route 2 times daily at 12 noon and 4 pm.   QUEtiapine 50 MG tablet Commonly known as: SEROQUEL Take 1 tablet (50 mg total) by mouth at bedtime. What changed:   medication strength  how much to take   sacubitril-valsartan 24-26 MG Commonly known as: ENTRESTO Take 1 tablet by mouth 2 (two) times daily.      Follow-up Information    [name], MD Follow up on 10/31/2019.   Specialty: Cardiology Why: at 9:00 AM  in office with Dr. Theodosia Blender nurse practitioner Lewayne Bunting Contact information: [address] [address] [place] Follow up on 12/02/2019.   Specialty: Internal Medicine Why: 1 pm for hospital follow up Contact information: [place] [id number] [place] Follow up.   Why: you can go here to get your medication assitance with entresto and eliquis Contact information: [place] [phone]         No Known  Allergies  Consultations:  Cardiology   Procedures/Studies: Ct Angio Chest Pe W And/or Wo Contrast  Result Date: 10/11/2019 CLINICAL DATA:  Chest pain. EXAM: CT ANGIOGRAPHY CHEST WITH CONTRAST TECHNIQUE: Multidetector CT imaging of the chest was performed using the standard protocol during bolus administration of intravenous contrast. Multiplanar CT image reconstructions and MIPs were obtained to evaluate the vascular anatomy. CONTRAST:  144m OMNIPAQUE IOHEXOL 350 MG/ML SOLN COMPARISON:  Chest x-ray 10/11/2019 FINDINGS: Cardiovascular: Cardiovascular heart size is enlarged with no signs of pericardial effusion. Calcified coronary artery disease. Aortic caliber is normal. No signs of pulmonary embolism. Mediastinum/Nodes: Enlarged lymph nodes throughout the mediastinum. No axillary lymphadenopathy. No supraclavicular adenopathy. (Image 35, series 8) 2.5 cm right paratracheal lymph node. (Image 49, series 8): 1.8 cm lymph node in the subcarinal region along right lower lobe bronchus. Smaller lymph nodes scattered about the chest including in the AP window. No anterior mediastinal mass or adenopathy. Lungs/Pleura: Moderate right and small left pleural effusion layering dependently with interstitial nodular and ground-glass opacities in both right and left  chest worse in the right upper lobe. Basilar airspace disease and volume loss. Narrowing of the bilateral mainstem bronchi. Upper Abdomen: No acute findings in the upper abdomen. Musculoskeletal: No acute bone finding or destructive bone process. Review of the MIP images confirms the above findings. IMPRESSION: 1. No pulmonary embolism. 2. Signs of heart failure likely with superimposed pneumonia. 3. Narrowing of mainstem bronchi distal trachea may be due to bronchomalacia or may be physiologic in the setting of edema and pneumonia. 4. Mild aortic atherosclerosis with signs of extensive coronary artery disease. 5. Mediastinal nodal enlargement may be  reactive. Consider follow-up after resolution of acute symptoms to ensure resolution. Aortic Atherosclerosis (ICD10-I70.0). Electronically Signed   By: Zetta Bills M.D.   On: 10/11/2019 19:55   Dg Chest Port 1 View  Result Date: 10/11/2019 CLINICAL DATA:  Shortness of breath and chest pain EXAM: PORTABLE CHEST 1 VIEW COMPARISON:  09/23/2011 FINDINGS: The left lung is grossly clear. Ill-defined somewhat nodular airspace disease in the right upper lobe. Borderline cardiomegaly. No significant pleural effusion. No pneumothorax. Appearance of pleural deformity at the right apex. IMPRESSION: 1. Ill-defined somewhat nodular appearing opacity in the right upper lobe which could be secondary to pneumonia versus lung nodules. Further evaluation with chest CT could be obtained. Electronically Signed   By: Donavan Foil M.D.   On: 10/11/2019 16:45     Subjective: Patient seen and examined.  Eager to go home.  Walking in the room on room air.  Has some dry cough.  Remains afebrile.  Leg swelling has improved.  He is motivated to get back on medications and follow-ups. Sinus rhythm on telemetry.   Discharge Exam: Vitals:   10/15/19 2314 10/16/19 0315  BP: 121/81 122/73  Pulse: 69 66  Resp: 18 20  Temp: 98.2 F (36.8 C) 98 F (36.7 C)  SpO2: 95% 96%   Vitals:   10/15/19 1658 10/15/19 2007 10/15/19 2314 10/16/19 0315  BP: 140/74 120/82 121/81 122/73  Pulse: 81 70 69 66  Resp: _0 Temp: 98.2 F (36.8 C) 98.4 F (36.9 C) 98.2 F (36.8 C) 98 F (36.7 C)  TempSrc: Oral Oral Oral Oral  SpO2: 95% 93% 95% 96%  Weight:    130.1 kg  Height:        General: Pt is alert, awake, not in acute distress, on room air. Cardiovascular: RRR, S1/S2 +, no rubs, no gallops Respiratory: CTA bilaterally, no wheezing, no rhonchi Abdominal: Soft, NT, ND, bowel sounds + Extremities: no edema, no cyanosis    The results of significant diagnostics from this hospitalization (including imaging,  microbiology, ancillary and laboratory) are listed below for reference.     Microbiology: Recent Results (from the past 240 hour(s))  SARS CORONAVIRUS 2 (TAT 6-24 HRS) Nasopharyngeal Nasopharyngeal Swab     Status: None   Collection Time: 10/11/19  6:00 PM   Specimen: Nasopharyngeal Swab  Result Value Ref Range Status   SARS Coronavirus 2 NEGATIVE NEGATIVE Final    Comment: (NOTE) SARS-CoV-2 target nucleic acids are NOT DETECTED. The SARS-CoV-2 RNA is generally detectable in upper and lower respiratory specimens during the acute phase of infection. Negative results do not preclude SARS-CoV-2 infection, do not rule out co-infections with other pathogens, and should not be used as the sole basis for treatment or other patient management decisions. Negative results must be combined with clinical observations, patient history, and epidemiological information. The expected result is Negative. Fact Sheet for Patients: SugarRoll.be Fact Sheet  for Healthcare Providers: https://www.woods-mathews.com/ This test is not yet approved or cleared by the [place] and  has been authorized for detection and/or diagnosis of SARS-CoV-2 by FDA under an Emergency Use Authorization (EUA). This EUA will remain  in effect (meaning this test can be used) for the duration of the COVID-19 declaration under Section 56 4(b)(1) of the Act, 21 U.S.C. section 360bbb-3(b)(1), unless the authorization is terminated or revoked sooner. Performed at [facility], [place] [address], [place], [place] [address]   Sputum culture     Status: None   Collection Time: 10/12/19  7:00 AM   Specimen: Sputum  Result Value Ref Range Status   Specimen Description SPUTUM  Final   Special Requests NONE  Final   Sputum evaluation   Final    THIS SPECIMEN IS ACCEPTABLE FOR SPUTUM CULTURE Performed at [facility], [address] [place], [place] [address]    Report  Status 10/12/2019 FINAL  Final  Culture, respiratory     Status: None   Collection Time: 10/12/19  7:00 AM   Specimen: SPU  Result Value Ref Range Status   Specimen Description SPUTUM  Final   Special Requests NONE Reflexed from [id number]  Final   Gram Stain   Final    MODERATE WBC PRESENT, PREDOMINANTLY PMN ABUNDANT GRAM POSITIVE COCCI IN PAIRS IN CLUSTERS MODERATE GRAM NEGATIVE RODS MODERATE GRAM POSITIVE RODS    Culture   Final    FEW Consistent with normal respiratory flora. Performed at [facility], [place] [address], [place], [place] [address]    Report Status 10/14/2019 FINAL  Final     Labs: BNP (last 3 results) Recent Labs    10/11/19 1603  BNP 7,371.0*   Basic Metabolic Panel: Recent Labs  Lab 10/12/19 0545 10/13/19 0523 10/14/19 0317 10/14/19 1501 10/14/19 1503 10/15/19 0327 10/16/19 0337  NA 139 139 142 142 141 138 141  K 4.0 4.0 4.1 3.0* 3.0* 3.1* 3.6  CL 104 102 101  --   --  102 105  CO2 _0 --   --  27 28  GLUCOSE 315* 176* 114*  --   --  200* 204*  BUN _1 --   --  15 13  CREATININE 0.99 1.16 1.13  --   --  1.00 0.94  CALCIUM 8.7* 8.7* 8.7*  --   --  8.1* 8.3*  MG 2.0 1.8  --   --   --   --  1.8  PHOS 3.7 3.8  --   --   --   --  3.5   Liver Function Tests: No results for input(s): AST, ALT, ALKPHOS, BILITOT, PROT, ALBUMIN in the last 168 hours. No results for input(s): LIPASE, AMYLASE in the last 168 hours. No results for input(s): AMMONIA in the last 168 hours. CBC: Recent Labs  Lab 10/11/19 1603 10/13/19 0523 10/14/19 0317 10/14/19 1501 10/14/19 1503 10/15/19 0327 10/16/19 0337  WBC 10.5 8.7 7.4  --   --  5.1 5.6  NEUTROABS 8.7*  --   --   --   --   --   --   HGB 12.5* 12.0* 11.8* 12.2* 12.2* 11.9* 11.2*  HCT 41.5 39.8 38.5* 36.0* 36.0* 38.2* 36.9*  MCV 85.4 86.9 84.8  --   --  84.0 85.4  PLT 317 350 345  --   --  349 322   Cardiac Enzymes: No results for  input(s): CKTOTAL, CKMB, CKMBINDEX, TROPONINI in the last 168  hours. BNP: Invalid input(s): POCBNP CBG: Recent Labs  Lab 10/15/19 0603 10/15/19 1216 10/15/19 1657 10/15/19 2119 10/16/19 0601  GLUCAP 148* 100* 186* 251* 166*   D-Dimer No results for input(s): DDIMER in the last 72 hours. Hgb A1c No results for input(s): HGBA1C in the last 72 hours. Lipid Profile No results for input(s): CHOL, HDL, LDLCALC, TRIG, CHOLHDL, LDLDIRECT in the last 72 hours. Thyroid function studies No results for input(s): TSH, T4TOTAL, T3FREE, THYROIDAB in the last 72 hours.  Invalid input(s): FREET3 Anemia work up No results for input(s): VITAMINB12, FOLATE, FERRITIN, TIBC, IRON, RETICCTPCT in the last 72 hours. Urinalysis    Component Value Date/Time   COLORURINE AMBER BIOCHEMICALS MAY BE AFFECTED BY COLOR (A) 06/10/2010 2207   APPEARANCEUR CLEAR 06/10/2010 2207   LABSPEC 1.039 (H) 06/10/2010 2207   PHURINE 5.5 06/10/2010 2207   GLUCOSEU NEGATIVE 06/10/2010 2207   HGBUR NEGATIVE 06/10/2010 2207   BILIRUBINUR SMALL (A) 06/10/2010 2207   KETONESUR 15 (A) 06/10/2010 2207   PROTEINUR NEGATIVE 06/10/2010 2207   UROBILINOGEN 1.0 06/10/2010 2207   NITRITE NEGATIVE 06/10/2010 2207   LEUKOCYTESUR  06/10/2010 2207    NEGATIVE MICROSCOPIC NOT DONE ON URINES WITH NEGATIVE PROTEIN, BLOOD, LEUKOCYTES, NITRITE, OR GLUCOSE <1000 mg/dL.   Sepsis Labs Invalid input(s): PROCALCITONIN,  WBC,  LACTICIDVEN Microbiology Recent Results (from the past 240 hour(s))  SARS CORONAVIRUS 2 (TAT 6-24 HRS) Nasopharyngeal Nasopharyngeal Swab     Status: None   Collection Time: 10/11/19  6:00 PM   Specimen: Nasopharyngeal Swab  Result Value Ref Range Status   SARS Coronavirus 2 NEGATIVE NEGATIVE Final    Comment: (NOTE) SARS-CoV-2 target nucleic acids are NOT DETECTED. The SARS-CoV-2 RNA is generally detectable in upper and lower respiratory specimens during the acute phase of infection. Negative results do not preclude SARS-CoV-2 infection, do not rule out co-infections with  other pathogens, and should not be used as the sole basis for treatment or other patient management decisions. Negative results must be combined with clinical observations, patient history, and epidemiological information. The expected result is Negative. Fact Sheet for Patients: SugarRoll.be Fact Sheet for Healthcare Providers: https://www.woods-mathews.com/ This test is not yet approved or cleared by the [place] FDA and  has been authorized for detection and/or diagnosis of SARS-CoV-2 by FDA under an Emergency Use Authorization (EUA). This EUA will remain  in effect (meaning this test can be used) for the duration of the COVID-19 declaration under Section 56 4(b)(1) of the Act, 21 U.S.C. section 360bbb-3(b)(1), unless the authorization is terminated or revoked sooner. Performed at [facility], [place] [address] [place], [place] [address]   Sputum culture     Status: None   Collection Time: 10/12/19  7:00 AM   Specimen: Sputum  Result Value Ref Range Status   Specimen Description SPUTUM  Final   Special Requests NONE  Final   Sputum evaluation   Final    THIS SPECIMEN IS ACCEPTABLE FOR SPUTUM CULTURE Performed at [facility], [address] [place], [place] [address]    Report Status 10/12/2019 FINAL  Final  Culture, respiratory     Status: None   Collection Time: 10/12/19  7:00 AM   Specimen: SPU  Result Value Ref Range Status   Specimen Description SPUTUM  Final   Special Requests NONE Reflexed from [id number]  Final   Gram Stain   Final    MODERATE WBC PRESENT,  PREDOMINANTLY PMN ABUNDANT GRAM POSITIVE COCCI IN PAIRS IN CLUSTERS MODERATE GRAM NEGATIVE RODS MODERATE GRAM POSITIVE RODS    Culture   Final    FEW Consistent with normal respiratory flora. Performed at [facility], [place] [address], [place], [place] [address]    Report Status 10/14/2019 FINAL  Final     Time coordinating discharge: 40  minutes  SIGNED:   Barb Merino, MD  [facility] 10/16/2019, 11:49 AM

## 2019-10-16 NOTE — Progress Notes (Signed)
Discharge teaching complete. Meds,diet, activity, follow up appointments reviewed and all questions answered. Copy of instructions given to patient and meds to [facility]. Patient discharged home with brother via wheelchair.

## 2019-10-17 MED FILL — TRUEplus LANCETS 28G MISC: 25 days supply | Qty: 100 | Fill #0

## 2019-10-17 MED FILL — !TRUE METRIX BLOOD GLUCOSE: 1 days supply | Qty: 1 | Fill #0

## 2019-10-17 MED FILL — TRUE METRIX TEST STRIP: 25 days supply | Qty: 100 | Fill #0

## 2019-10-17 NOTE — Telephone Encounter (Signed)
Patient contacted regarding discharge from [facility] on 10/16/19.  Patient understands to follow up with provider NINA HAMMOND NP on 10/31/19 at 9:00 AM at [facility]. Patient understands discharge instructions? YES Patient understands medications and regiment? YES Patient understands to bring all medications to this visit? YES

## 2019-10-27 ENCOUNTER — Telehealth: Payer: Self-pay | Admitting: Physician Assistant

## 2019-10-31 ENCOUNTER — Ambulatory Visit: Payer: Self-pay | Admitting: Cardiology

## 2019-11-03 ENCOUNTER — Ambulatory Visit (INDEPENDENT_AMBULATORY_CARE_PROVIDER_SITE_OTHER): Payer: Self-pay | Admitting: Physician Assistant

## 2019-11-03 ENCOUNTER — Other Ambulatory Visit: Payer: Self-pay

## 2019-11-03 ENCOUNTER — Encounter: Payer: Self-pay | Admitting: Physician Assistant

## 2019-11-03 VITALS — BP 120/88 | HR 101 | Ht 74.0 in | Wt 297.8 lb

## 2019-11-03 DIAGNOSIS — I5023 Acute on chronic systolic (congestive) heart failure: Secondary | ICD-10-CM

## 2019-11-03 DIAGNOSIS — I10 Essential (primary) hypertension: Secondary | ICD-10-CM

## 2019-11-03 DIAGNOSIS — I480 Paroxysmal atrial fibrillation: Secondary | ICD-10-CM

## 2019-11-03 DIAGNOSIS — I2510 Atherosclerotic heart disease of native coronary artery without angina pectoris: Secondary | ICD-10-CM

## 2019-11-03 MED ORDER — POTASSIUM CHLORIDE CRYS ER 20 MEQ PO TBCR: 20.0000 meq | EXTENDED_RELEASE_TABLET | Freq: Two times a day (BID) | ORAL | 6 refills | Status: DC

## 2019-11-03 MED ORDER — FUROSEMIDE 40 MG PO TABS: 40.0000 mg | ORAL_TABLET | Freq: Two times a day (BID) | ORAL | 6 refills | Status: DC

## 2019-11-03 NOTE — Progress Notes (Signed)
Cardiology Office Note:    Date:  11/03/2019   ID:  [name], DOB [birth year], MRN [id number]  PCP:  Nolene Ebbs, MD  Cardiologist:  [name], MD  Electrophysiologist:  None   Referring MD: Nolene Ebbs, MD   Chief Complaint  Patient presents with  . Hospitalization Follow-up    CHF    History of Present Illness:    [name] is a 58 y.o. male with:   Coronary artery disease   S/p PCI to OM in 2012  Cath 09/2019: patent LAD, severe dz in small Dx, OM2 occluded at stent, tiny OM1 w prox 90; RCA prox 85-90 and 60-70 >> Med Rx    Chronic systolic CHF  Mixed Ischemic and Non-Ischemic CM  EF 25% (echocardiogram 09/2019)  ?AFib   CHA2DS2-VASc=3 (CHF, CAD, HTN) >> Apixaban   Diabetes mellitus   Hypertension   Tobacco use  Schizophrenia   [name] was previously followed by Dr. Terrence Dupont.  He had not been seen in follow up in quite some time.  He was admitted 11/14-11/19 with acute CHF and possible pneumonia.  Of note, he had not taken any medications for 7 mos prior to admission.  His EF was noted to be down at 25-30% on echocardiogram and he underwent cardiac catheterization.  This demonstrated small vessel disease with severe disease in a small Dx and a small OM1.  The previous stent in OM2 was occluded and there was severe disease in the RCA.  The LAD was patent.  His cardiomyopathy was felt to be mixed and the non-ischemic portion was the major contributor to his low EF.  He was noted to be in possible atrial fibrillation upon admission.  There was some question as to whether this was sinus rhythm with PACs and multifocal atrial tachycardia.  But he was ultimately left on anticoagulation with Apixaban.     He returns for follow-up.  Since discharge, he has noted shortness of breath with activity.  However, he has not noticed orthopnea or PND.  He has not noticed significant leg swelling.  He has an occasional pain radiate across his chest.   This lasts less than 1 second.  He has not had syncope.  Prior CV studies:   The following studies were reviewed today:  Cardiac catheterization 10/14/2019 LAD mid 30; D1 90 (small) OM1 small 90, OM2 stent 100; LPL1 100 RCA ost 85, prox 70, mid 24 EF < 25   Known severe LV systolic dysfunction by echo with EF less than 25%.  Normal LVEDP 16 to 20 mmHg and mean pulmonary capillary wedge pressure 16 mmHg.  Widely patent LAD  First diagonal with severe segmental stenoses.  Relatively small vessel.   Totally occluded second obtuse marginal which has been previously stented.  Fills late by collaterals, sluggishly.  First obtuse marginal is tiny and has proximal 90% stenosis.  RCA appears codominant, has an 85 to 90% proximal stenosis, and 60 to 70% proximal stenosis. RECOMMENDATIONS:  Diffuse coronary disease as outlined above.  The entire LAD is widely patent.  Circumflex is patent but the first obtuse marginal has high-grade stenosis in the previously stented second obtuse marginal is totally occluded.  Seems medical therapy would be appropriate.  Aggressive medical therapy for systolic heart failure.  Echocardiogram 10/12/2019 EF 25-30, global HK, mod RAE  Chest CTA 10/11/2019 IMPRESSION: 1. No pulmonary embolism. 2. Signs of heart failure likely with superimposed pneumonia. 3. Narrowing of mainstem bronchi distal trachea may be  due to bronchomalacia or may be physiologic in the setting of edema and pneumonia. 4. Mild aortic atherosclerosis with signs of extensive coronary artery disease. 5. Mediastinal nodal enlargement may be reactive. Consider follow-up after resolution of acute symptoms to ensure resolution.  Aortic Atherosclerosis (ICD10-I70.0).  Myoview 04/02/2015  Findings consistent with ischemia.  The left ventricular ejection fraction is mildly decreased (45-54%).  This is an intermediate risk study.  Defect 1: There is a small defect present in the mid  anterior location.  Defect 2: There is a small defect present in the apical septal location.    Past Medical History:  Diagnosis Date  . Anxiety   . CAD (coronary artery disease)   . Depression   . Diabetes mellitus   . Hypertension   . Schizophrenia [facility])    Surgical Hx: The patient  has a past surgical history that includes Coronary stent placement; Hernia repair; and RIGHT/LEFT HEART CATH AND CORONARY ANGIOGRAPHY (N/A, 10/14/2019).   Current Medications: Current Meds  Medication Sig  . apixaban (ELIQUIS) 5 MG TABS tablet Take 1 tablet (5 mg total) by mouth 2 (two) times daily.  [name] Kitchen atorvastatin (LIPITOR) 40 MG tablet Take 1 tablet (40 mg total) by mouth daily at 6 PM.  . BLACK CURRANT SEED OIL PO Take by mouth daily. 1 tsp every day  . blood glucose meter kit and supplies KIT Dispense based on patient and insurance preference. Use up to four times daily as directed. (FOR ICD-9 250.00, 250.01).  . carvedilol (COREG) 6.25 MG tablet Take 1 tablet (6.25 mg total) by mouth 2 (two) times daily with a meal.  . furosemide (LASIX) 40 MG tablet Take 1 tablet (40 mg total) by mouth 2 (two) times daily.  . Insulin Aspart Prot & Aspart (INSULIN ASP PROT & ASP FLEXPEN) (70-30) 100 UNIT/ML SUPN Inject 15 Units into the skin 2 (two) times daily before a meal.  . Insulin Pen Needle (PEN NEEDLES) 30G X 5 MM MISC 1 each by Does not apply route 2 times daily at 12 noon and 4 pm.  . QUEtiapine (SEROQUEL) 50 MG tablet Take 1 tablet (50 mg total) by mouth at bedtime.  . sacubitril-valsartan (ENTRESTO) 24-26 MG Take 1 tablet by mouth 2 (two) times daily.  . [DISCONTINUED] furosemide (LASIX) 40 MG tablet Take 1 tablet (40 mg total) by mouth daily.  . [DISCONTINUED] furosemide (LASIX) 40 MG tablet Take 1 tablet (40 mg total) by mouth 2 (two) times daily.     Allergies:   Patient has no known allergies.   Social History   Tobacco Use  . Smoking status: Never Smoker  . Smokeless tobacco: Never Used   Substance Use Topics  . Alcohol use: No  . Drug use: No     Family Hx: The patient's family history includes Heart failure in his mother; Mental illness in his sister and sister.  ROS:   Please see the history of present illness.    ROS All other systems reviewed and are negative.   EKGs/Labs/Other Test Reviewed:    EKG:  EKG is  ordered today.  The ekg ordered today demonstrates sinus tachycardia with a heart rate of 101, rightward axis, PVC, 4 beat atrial run versus atrial fibrillation versus ectopic atrial rhythm, QTC 495  Recent Labs: 10/11/2019: B Natriuretic Peptide 1,202.2 10/12/2019: TSH 1.741 10/16/2019: BUN 13; Creatinine, Ser 0.94; Hemoglobin 11.2; Magnesium 1.8; Platelets 322; Potassium 3.6; Sodium 141   Recent Lipid Panel Lab Results  Component Value Date/Time  CHOL 149 03/29/2015 02:02 PM   TRIG 119 03/29/2015 02:02 PM   HDL 41 03/29/2015 02:02 PM   CHOLHDL 3.6 03/29/2015 02:02 PM   LDLCALC 84 03/29/2015 02:02 PM    Physical Exam:    VS:  BP 120/88   Pulse (!) 101   Ht _0  (1.88 m)   Wt 297 lb 12.8 oz (135.1 kg)   SpO2 98%   BMI 38.24 kg/m     Wt Readings from Last 3 Encounters:  11/03/19 297 lb 12.8 oz (135.1 kg)  10/16/19 286 lb 13.1 oz (130.1 kg)  07/28/13 294 lb (133.4 kg)     Physical Exam  Constitutional: He is oriented to person, place, and time. He appears well-developed and well-nourished. No distress.  HENT:  Head: Normocephalic and atraumatic.  Eyes: No scleral icterus.  Neck: JVD (JVP ~ 6 cm) present. No thyromegaly present.  Cardiovascular: Normal rate and regular rhythm.  No murmur heard. Pulmonary/Chest: He has no wheezes. He has rales in the right lower field and the left lower field.  Abdominal: Soft.  Musculoskeletal:        General: Edema (trace bilat edema) present.  Lymphadenopathy:    He has no cervical adenopathy.  Neurological: He is alert and oriented to person, place, and time.  Skin: Skin is warm and dry.   Psychiatric: He has a normal mood and affect.    ASSESSMENT & PLAN:    1. Acute on chronic systolic CHF (congestive heart failure) ([place]) His weight is up 10 pounds since discharge from the hospital.  He has rales on exam.  He needs further diuresis.  He also has difficulty affording medications.  He has significant barriers to his care due to social determinants of health.  He needs assistance with medications and early follow-up.  I also think he would benefit from follow up in the AHF Clinic.    -Continue current dose of carvedilol, Entresto  -Increase Lasix to 40 mg twice daily  -Add potassium 20 mEq twice daily  -Obtain BMET, BNP today  -Follow-up in 1 week in person with a repeat BMET  -I will ask our staff to help him with assistance with Entresto, Eliquis  -I will also ask the SW at the AHF Clinic to contact him for assistance  -Refer to AHF Clinic   2. Coronary artery disease involving native coronary artery of native heart without angina pectoris Prior history of stenting to the OM2.  He had mildly elevated troponin levels without clear trend during his recent admission for heart failure.  This was likely related to demand ischemia.  Cardiac catheterization demonstrated small vessel disease with severe disease in a small diagonal and a small OM1.  The stent in the OM2 was occluded and there was severe disease in the RCA.  The LAD was patent.  Overall, it was felt that he had a mixed ischemic and nonischemic cardiomyopathy.  It was felt that the nonischemic portion was a greater contributor to his cardiomyopathy.  Medical therapy was recommended for his coronary artery disease.  He is not on aspirin as he is on Apixaban.  Continue atorvastatin, beta-blocker, ARB.  3. PAF (paroxysmal atrial fibrillation) ([place]) I reviewed his case with Dr. Irish Lack who followed him in the hospital.  There was one EKG that was suspicious for atrial fibrillation.  Therefore, he was kept on anticoagulation  with Apixaban.  He has several beats on his ECG today that are more rapid than his underlying rhythm.  This is not likely atrial fibrillation.  He does seem to have different P wave morphologies.  He may have multifocal atrial tachycardia.  He is tolerating anticoagulation at this point.  At some point we should probably arrange an event monitor to assess further for atrial fibrillation.  His financial constraints limit this somewhat.  We should wait to order a monitor until we can ensure he has financial assistance.  For now, we will continue him on Apixaban.  His heart rate is elevated today.  At follow-up next week, if his volume is improved, consider increasing carvedilol dose.  4. Essential hypertension Fair control.     Dispo:  Return in about 1 week (around 11/10/2019) for Close Follow Up w/ Dr. Radford Pax , or PA/NP, in person.   Medication Adjustments/Labs and Tests Ordered: Current medicines are reviewed at length with the patient today.  Concerns regarding medicines are outlined above.  Tests Ordered: Orders Placed This Encounter  Procedures  . Basic Metabolic Panel (BMET)  . Pro b natriuretic peptide  . AMB referral to CHF clinic  . EKG 12-Lead   Medication Changes: Meds ordered this encounter  Medications  . DISCONTD: furosemide (LASIX) 40 MG tablet    Sig: Take 1 tablet (40 mg total) by mouth 2 (two) times daily.    Dispense:  60 tablet    Refill:  6  . DISCONTD: potassium chloride SA (KLOR-CON) 20 MEQ tablet    Sig: Take 1 tablet (20 mEq total) by mouth 2 (two) times daily.    Dispense:  60 tablet    Refill:  6  . furosemide (LASIX) 40 MG tablet    Sig: Take 1 tablet (40 mg total) by mouth 2 (two) times daily.    Dispense:  60 tablet    Refill:  6  . potassium chloride SA (KLOR-CON) 20 MEQ tablet    Sig: Take 1 tablet (20 mEq total) by mouth 2 (two) times daily.    Dispense:  60 tablet    Refill:  6    Signed, Richardson Dopp, PA-C  11/03/2019 5:31 PM    [facility] [place], [place], [place]  [address] Phone: [phone]; Fax: [phone]

## 2019-11-03 NOTE — Patient Instructions (Signed)
Medication Instructions:   Your physician has recommended you make the following change in your medication:   1) Increase your Lasix to 40MG , 1 tablet by mouth twice a day 2) Start Potassium 23meq, 1 tablet by mouth twice a day  *If you need a refill on your cardiac medications before your next appointment, please call your pharmacy*  Lab Work:  You will have labs drawn today: BMET and BNP  If you have labs (blood work) drawn today and your tests are completely normal, you will receive your results only by: [name] Kitchen MyChart Message (if you have MyChart) OR . A paper copy in the mail If you have any lab test that is abnormal or we need to change your treatment, we will call you to review the results.  Testing/Procedures:  None ordered today  Follow-Up:  Follow up on 11/10/19 at 3:20PM with [name], MD  Other Instructions  Contact the [facility] for refills.

## 2019-11-04 ENCOUNTER — Telehealth (HOSPITAL_COMMUNITY): Payer: Self-pay | Admitting: Licensed Clinical Social Worker

## 2019-11-04 ENCOUNTER — Telehealth: Payer: Self-pay | Admitting: Licensed Clinical Social Worker

## 2019-11-04 ENCOUNTER — Other Ambulatory Visit: Payer: Self-pay

## 2019-11-04 DIAGNOSIS — I5023 Acute on chronic systolic (congestive) heart failure: Secondary | ICD-10-CM

## 2019-11-04 DIAGNOSIS — I2510 Atherosclerotic heart disease of native coronary artery without angina pectoris: Secondary | ICD-10-CM

## 2019-11-04 LAB — BASIC METABOLIC PANEL
BUN/Creatinine Ratio: 13 (ref 9–20)
BUN: 15 mg/dL (ref 6–24)
CO2: 19 mmol/L — ABNORMAL LOW (ref 20–29)
Calcium: 9 mg/dL (ref 8.7–10.2)
Chloride: 104 mmol/L (ref 96–106)
Creatinine, Ser: 1.12 mg/dL (ref 0.76–1.27)
GFR calc Af Amer: 83 mL/min/{1.73_m2} (ref >59)
GFR calc non Af Amer: 72 mL/min/{1.73_m2} (ref >59)
Glucose: 298 mg/dL — ABNORMAL HIGH (ref 65–99)
Potassium: 3.7 mmol/L (ref 3.5–5.2)
Sodium: 141 mmol/L (ref 134–144)

## 2019-11-04 LAB — PRO B NATRIURETIC PEPTIDE: NT-Pro BNP: 6177 pg/mL — ABNORMAL HIGH (ref 0–210)

## 2019-11-04 MED FILL — POTASSIUM CL ER 20 MEQ TAB: 20 | 30 days supply | Qty: 60 | Fill #0

## 2019-11-04 MED FILL — QUETIAPINE FUMARATE 50 MG T: 50 | 30 days supply | Qty: 30 | Fill #0

## 2019-11-04 NOTE — Telephone Encounter (Signed)
CSW consulted to speak with pt regarding patient assistance programs for Eliquis and Entresto.  CSW called pt and explained PAP- CSW mailing out applications to patient- patient to complete patient application and return to cardiology office for MD to complete physician portion.  Patient also stated that he can't afford prescriptions at this time- CSW inquired how much they cost and pt states he doesn't know as he hasn't spoken to pharmacy.  CSW encouraged pt to call [facility] pharmacy ASAP to inquire about cost as they are able to work with patients on copay expenses and might accept a partial payment.  CSW then spoke with pt regarding insurance options.  Pt states he has been unemployed for 10 years and has attempted to apply for disability but that was years ago.  CSW encouraged pt to attempt applying for disability again as he states he has not been working due to medical concerns.  Pt confirmed he had the number and would call to set up phone appt.  CSW will continue to follow and assist as needed  [name], [facility] Desk#: [phone] Cell#: [phone]

## 2019-11-04 NOTE — Telephone Encounter (Signed)
CSW mailed patient assistance applications for Eliquis and Entresto to patient for completion. Tammy Sours, CSW continuing to follow up with patient for additional needs. Raquel Sarna, [place], [place]

## 2019-11-05 ENCOUNTER — Telehealth (HOSPITAL_COMMUNITY): Payer: Self-pay | Admitting: Licensed Clinical Social Worker

## 2019-11-05 MED FILL — FUROSEMIDE 40 MG TAB: 40 | 30 days supply | Qty: 60 | Fill #0

## 2019-11-05 NOTE — Telephone Encounter (Signed)
CSW called pt back to touch base regarding getting medication and insurance concerns.  Pt reports that he was able to pick up his mediation today from [place] reiterated for him to inquire about assistance if he can't afford his copays.  CSW then inquired if pt had an opportunity to call [employer] to set up appt to apply for SSI benefits.  Pt states his sister is handling this.  Pt then state he actually just received a notice in the mail that he was approved for Medicare Part A but wouldn't be eligible for part B and D until a later time- CSW unsure why this would be and asked pt to send a picture to me of the card and the notice explaining his limited coverage.  CSW inquired if he was already receiving disability as someone his age would only be able to get Medicare if he was disabled for 2 years or more.  Pt states he has not been receiving benefits but that he is supposed to start getting $2,000/month starting 2021- CSW also asked pt to send pictures of this information as per patient's own report he was not working for over 10 years now which if that were the case he would not be eligible for such high disability benefits.  CSW awaiting documentation from patient to help inform our steps moving forward.   CSW will continue to follow and assist as needed  [name], [facility] Desk#: [phone] Cell#: [phone]

## 2019-11-10 ENCOUNTER — Other Ambulatory Visit: Payer: Self-pay

## 2019-11-10 ENCOUNTER — Encounter: Payer: Self-pay | Admitting: Cardiology

## 2019-11-10 ENCOUNTER — Ambulatory Visit (INDEPENDENT_AMBULATORY_CARE_PROVIDER_SITE_OTHER): Payer: Self-pay | Admitting: Cardiology

## 2019-11-10 VITALS — BP 112/68 | HR 58 | Ht 74.0 in | Wt 297.0 lb

## 2019-11-10 DIAGNOSIS — I5023 Acute on chronic systolic (congestive) heart failure: Secondary | ICD-10-CM

## 2019-11-10 DIAGNOSIS — I5022 Chronic systolic (congestive) heart failure: Secondary | ICD-10-CM

## 2019-11-10 DIAGNOSIS — I10 Essential (primary) hypertension: Secondary | ICD-10-CM

## 2019-11-10 DIAGNOSIS — I480 Paroxysmal atrial fibrillation: Secondary | ICD-10-CM

## 2019-11-10 MED ORDER — SPIRONOLACTONE 25 MG PO TABS: 12.5000 mg | ORAL_TABLET | Freq: Every day | ORAL | 3 refills | Status: DC

## 2019-11-10 MED FILL — SPIRONOLACTONE 25 MG TABLET: 25 | 30 days supply | Qty: 15 | Fill #0

## 2019-11-10 NOTE — Progress Notes (Signed)
Cardiology Office Note:    Date:  11/11/2019   ID:  [name], DOB January 19, 1961, MRN [id number]  PCP:  Joel Ebbs, MD  Cardiologist:  Joel Him, MD    Referring MD: Joel Ebbs, MD   Chief Complaint  Patient presents with   Congestive Heart Failure   Atrial Fibrillation   Hypertension    History of Present Illness:    [name] is a 58 y.o. 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  [name] was previously followed by Dr. Terrence Dupont.  He had not been seen in follow up in quite some time.  He was admitted 11/14-11/19 with acute CHF and possible pneumonia.  Of note, he had not taken any medications for 7 mos prior to admission.  His EF was noted to be down at 25-30% on echocardiogram and he underwent cardiac catheterization.  This demonstrated small vessel disease with severe disease in a small Dx and a small OM1.  The previous stent in OM2 was occluded and there was severe disease in the RCA.  The LAD was patent.  His cardiomyopathy was felt to be mixed and the non-ischemic portion was the major contributor to his low EF.  He was noted to be in possible atrial fibrillation upon admission.  There was some question as to whether this was sinus rhythm with PACs and multifocal atrial tachycardia.  But he was ultimately left on anticoagulation with Apixaban.     He is here today and is doing well.  He is here today for followup and is doing well.  He denies any chest pain or pressure, SOB, DOE, PND, orthopnea, LE edema, dizziness, palpitations or syncope. He is compliant with his meds and is tolerating meds with no  SE.    Past Medical History:  Diagnosis Date   Anxiety    CAD (coronary artery disease)    Depression    Diabetes mellitus    Hypertension    Schizophrenia ([place])     Past Surgical History:  Procedure Laterality Date   CORONARY STENT PLACEMENT     HERNIA REPAIR     RIGHT/LEFT HEART CATH AND CORONARY ANGIOGRAPHY N/A 10/14/2019   Procedure: RIGHT/LEFT HEART CATH AND CORONARY ANGIOGRAPHY;  Surgeon: Belva Crome, MD;  Location: [facility];  Service: Cardiovascular;  Laterality: N/A;    Current Medications: No outpatient medications have been marked as taking for the 11/10/19 encounter (Office Visit) with [name], MD.     Allergies:   Patient has no known allergies.   Social History   Socioeconomic History   Marital status: Legally Separated    Spouse name: Not on file   Number of children: Not on file   Years of education: Not on file   Highest education level: Not on file  Occupational History   Not on file  Tobacco Use   Smoking status: Never Smoker   Smokeless tobacco: Never Used  Substance and Sexual Activity   Alcohol use: No   Drug use: No   Sexual activity: Not on file  Other Topics Concern   Not on file  Social History  Narrative   Not on file   Social Determinants of Health   Financial Resource Strain:    Difficulty of Paying Living Expenses: Not on file  Food Insecurity:    Worried About [place] in the Last Year: Not on file   Ran Out of Food in the Last Year: Not on file  Transportation Needs:    Lack of Transportation (Medical): Not on file   Lack of Transportation (Non-Medical): Not on file  Physical Activity:    Days of Exercise per Week: Not on file   Minutes of Exercise per Session: Not on file  Stress:    Feeling of Stress : Not on file  Social Connections:    Frequency of Communication with Friends and Family: Not on file   Frequency of Social Gatherings with Friends and Family: Not on file   Attends Religious  Services: Not on file   Active Member of Clubs or Organizations: Not on file   Attends Archivist Meetings: Not on file   Marital Status: Not on file     Family History: The patient's family history includes Heart failure in his mother; Mental illness in his sister and sister.  ROS:   Please see the history of present illness.    ROS  All other systems reviewed and negative.   EKGs/Labs/Other Studies Reviewed:    The following studies were reviewed today: labs  EKG:  EKG is  ordered today.  The ekg ordered today demonstrates NSR with PAT and T wave abnormality c/w lateral ischemia  Recent Labs: 10/11/2019: B Natriuretic Peptide 1,202.2 10/12/2019: TSH 1.741 10/16/2019: Hemoglobin 11.2; Magnesium 1.8; Platelets 322 11/03/2019: BUN 15; Creatinine, Ser 1.12; NT-Pro BNP 6,177; Potassium 3.7; Sodium 141   Recent Lipid Panel    Component Value Date/Time   CHOL 149 03/29/2015 1402   TRIG 119 03/29/2015 1402   HDL 41 03/29/2015 1402   CHOLHDL 3.6 03/29/2015 1402   VLDL 24 03/29/2015 1402   LDLCALC 84 03/29/2015 1402    Physical Exam:    VS:  BP 112/68 Comment: Pt stated shoes are heavy and to much trouble to take off  Pulse (!) 58   Ht 6\' 2"  (1.88 m)   Wt 297 lb (134.7 kg)   SpO2 97%   BMI 38.13 kg/m     Wt Readings from Last 3 Encounters:  11/10/19 297 lb (134.7 kg)  11/03/19 297 lb 12.8 oz (135.1 kg)  10/16/19 286 lb 13.1 oz (130.1 kg)     GEN:  Well nourished, well developed in no acute distress HEENT: Normal NECK: No JVD; No carotid bruits LYMPHATICS: No lymphadenopathy CARDIAC: RRR, no murmurs, rubs, gallops RESPIRATORY:  Clear to auscultation without rales, wheezing or rhonchi  ABDOMEN: Soft, non-tender, non-distended MUSCULOSKELETAL:  No edema; No deformity  SKIN: Warm and dry NEUROLOGIC:  Alert and oriented x 3 PSYCHIATRIC:  Normal affect   ASSESSMENT:    1. PAF (paroxysmal atrial fibrillation) ([place])   2. Chronic systolic congestive heart  failure ([place])   3. Acute on chronic systolic CHF (congestive heart failure) ([place])   4. Essential hypertension    PLAN:    In order of problems listed above:  1.  Paroxysmal atrial fibrillation -he appears to be in NSR on exam today -he is having short runs of PAT on EKG -Increase Carvedilol 12.5mg  BID and continue Eliquis 5mg  BID  2.  Chronic systolic CHF -he does not appear volume overloaded on exam today -he has  no SOB or LE edema -continue lasix 40mg  daily, Carvedilol  -add Arlyce Harman 12.5mg  daily -check BMET in 1 week  3.  HTN -BP controlled -continue Carvedilol 6.25mg  BID and spiro 25mg  daily   Medication Adjustments/Labs and Tests Ordered: Current medicines are reviewed at length with the patient today.  Concerns regarding medicines are outlined above.  Orders Placed This Encounter  Procedures   EKG 12/Charge capture   Meds ordered this encounter  Medications   spironolactone (ALDACTONE) 25 MG tablet    Sig: Take 0.5 tablets (12.5 mg total) by mouth daily.    Dispense:  45 tablet    Refill:  3    Signed, Joel Him, MD  11/11/2019 1:15 PM    [facility]

## 2019-11-10 NOTE — Patient Instructions (Signed)
Medication Instructions:  Your physician has recommended you make the following change in your medication: 1) START taking spironolactone 12.5mg  (1/2 tablet) daily  *If you need a refill on your cardiac medications before your next appointment, please call your pharmacy*  Follow-Up: At [facility], you and your health needs are our priority.  As part of our continuing mission to provide you with exceptional heart care, we have created designated Provider Care Teams.  These Care Teams include your primary Cardiologist (physician) and Advanced Practice Providers (APPs -  Physician Assistants and Nurse Practitioners) who all work together to provide you with the care you need, when you need it.

## 2019-11-11 ENCOUNTER — Other Ambulatory Visit: Payer: Self-pay

## 2019-11-11 DIAGNOSIS — I5023 Acute on chronic systolic (congestive) heart failure: Secondary | ICD-10-CM

## 2019-11-13 ENCOUNTER — Telehealth (HOSPITAL_COMMUNITY): Payer: Self-pay | Admitting: Licensed Clinical Social Worker

## 2019-11-13 NOTE — Telephone Encounter (Signed)
CSW called pt to check in and verify that he received his Patient Assistance applications for Eliquis and Entresto in the mail.  Pt confirms that he has and states he has already turned back in to MD office.  Pt reports no other concerns at this time  [name], [place] Worker [facility] Desk#: [phone] Cell#: [phone]

## 2019-11-24 ENCOUNTER — Telehealth: Payer: Self-pay | Admitting: Licensed Clinical Social Worker

## 2019-11-24 ENCOUNTER — Other Ambulatory Visit: Payer: Self-pay

## 2019-11-24 ENCOUNTER — Telehealth: Payer: Self-pay | Admitting: Cardiology

## 2019-11-24 MED ORDER — APIXABAN 5 MG PO TABS: 5.0000 mg | ORAL_TABLET | Freq: Two times a day (BID) | ORAL | 3 refills | Status: DC

## 2019-11-24 MED ORDER — CARVEDILOL 6.25 MG PO TABS: 6.2500 mg | ORAL_TABLET | Freq: Two times a day (BID) | ORAL | 3 refills | Status: DC

## 2019-11-24 MED ORDER — ATORVASTATIN CALCIUM 40 MG PO TABS: 40.0000 mg | ORAL_TABLET | Freq: Every day | ORAL | 3 refills | Status: DC

## 2019-11-24 MED FILL — CARVEDILOL 6.25 MG TABLET: 6.25 | 30 days supply | Qty: 60 | Fill #0

## 2019-11-24 MED FILL — ATORVASTATIN CALCIUM 40 MG: 40 | 30 days supply | Qty: 30 | Fill #0

## 2019-11-24 MED FILL — !ELIQUIS 5MG TABLET: 5 | 30 days supply | Qty: 60 | Fill #0

## 2019-11-24 NOTE — Telephone Encounter (Signed)
*  STAT* If patient is at the pharmacy, call can be transferred to refill team.   1. Which medications need to be refilled? (please list name of each medication and dose if known)   apixaban (ELIQUIS) 5 MG TABS tablet(Expired)  atorvastatin (LIPITOR) 40 MG tablet(Expired)  carvedilol (COREG) 6.25 MG tablet(Expired)   2. Which pharmacy/location (including street and city if local pharmacy) is medication to be sent to? [place], [place]  3. Do they need a 30 day or 90 day supply? 90 day

## 2019-11-24 NOTE — Telephone Encounter (Addendum)
CSW received call from pt stating that he is out of all of his heart medications.  CSW verified that several of his cardiology medications are out of refills.  CSW called pt Cardiology office and put in request for refills to be sent into [facility].  Pt is also out of Seroquel but has not seen a PCP at this time- post hospital new pt appt not until 1/5.  CSW called [name] who confirmed they could see pt as a new pt and help him get new script for seroquel after an evaluation.   CSW called to provide pt with number to [facility] to complete intake- unable to reach left message  CSW will continue to follow and assist as needed  [name], [facility] Desk#: [phone] Cell#: [phone]

## 2019-11-26 ENCOUNTER — Telehealth (HOSPITAL_COMMUNITY): Payer: Self-pay | Admitting: Licensed Clinical Social Worker

## 2019-11-26 NOTE — Telephone Encounter (Signed)
Pt called back CSW and informed that he is now established with a psychiatrist who has prescribed him seroquel and will managing this for him.  Only problem is that pt reports the seroquel is over $400 as he does not currently have prescription coverage.  CSW able to find that with good Rx card this would be $15 at [facility] which is where the script was sent- also encouraged him to talk to [facility] regarding their cost for this medication as that is where he gets his other scripts.  CSW will continue to follow and assist as needed  [name], [facility] Desk#: [phone] Cell#: [phone]

## 2019-11-27 MED FILL — SPIRONOLACTONE 25 MG TABLET: 25 | 30 days supply | Qty: 15 | Fill #0

## 2019-11-28 DIAGNOSIS — N529 Male erectile dysfunction, unspecified: Secondary | ICD-10-CM

## 2019-11-28 HISTORY — DX: Male erectile dysfunction, unspecified: N52.9

## 2019-12-02 ENCOUNTER — Ambulatory Visit (INDEPENDENT_AMBULATORY_CARE_PROVIDER_SITE_OTHER): Payer: Self-pay | Admitting: Family Medicine

## 2019-12-02 ENCOUNTER — Encounter: Payer: Self-pay | Admitting: Family Medicine

## 2019-12-02 ENCOUNTER — Other Ambulatory Visit: Payer: Self-pay

## 2019-12-02 VITALS — BP 136/88 | HR 85 | Temp 98.0°F | Resp 20 | Ht 74.0 in | Wt 293.0 lb

## 2019-12-02 DIAGNOSIS — F410 Panic disorder [episodic paroxysmal anxiety] without agoraphobia: Secondary | ICD-10-CM

## 2019-12-02 DIAGNOSIS — Z8639 Personal history of other endocrine, nutritional and metabolic disease: Secondary | ICD-10-CM

## 2019-12-02 DIAGNOSIS — E1142 Type 2 diabetes mellitus with diabetic polyneuropathy: Secondary | ICD-10-CM

## 2019-12-02 DIAGNOSIS — E559 Vitamin D deficiency, unspecified: Secondary | ICD-10-CM

## 2019-12-02 DIAGNOSIS — F411 Generalized anxiety disorder: Secondary | ICD-10-CM

## 2019-12-02 DIAGNOSIS — Z7689 Persons encountering health services in other specified circumstances: Secondary | ICD-10-CM

## 2019-12-02 DIAGNOSIS — R7309 Other abnormal glucose: Secondary | ICD-10-CM

## 2019-12-02 DIAGNOSIS — Z794 Long term (current) use of insulin: Secondary | ICD-10-CM

## 2019-12-02 DIAGNOSIS — N529 Male erectile dysfunction, unspecified: Secondary | ICD-10-CM

## 2019-12-02 DIAGNOSIS — R739 Hyperglycemia, unspecified: Secondary | ICD-10-CM

## 2019-12-02 DIAGNOSIS — Z0000 Encounter for general adult medical examination without abnormal findings: Secondary | ICD-10-CM

## 2019-12-02 DIAGNOSIS — I5022 Chronic systolic (congestive) heart failure: Secondary | ICD-10-CM

## 2019-12-02 DIAGNOSIS — Z09 Encounter for follow-up examination after completed treatment for conditions other than malignant neoplasm: Secondary | ICD-10-CM

## 2019-12-02 DIAGNOSIS — E538 Deficiency of other specified B group vitamins: Secondary | ICD-10-CM

## 2019-12-02 DIAGNOSIS — E1165 Type 2 diabetes mellitus with hyperglycemia: Secondary | ICD-10-CM

## 2019-12-02 DIAGNOSIS — F200 Paranoid schizophrenia: Secondary | ICD-10-CM

## 2019-12-02 LAB — POCT URINALYSIS DIPSTICK
Bilirubin, UA: NEGATIVE
Glucose, UA: POSITIVE — AB
Ketones, UA: NEGATIVE
Leukocytes, UA: NEGATIVE
Nitrite, UA: NEGATIVE
Protein, UA: POSITIVE — AB
Spec Grav, UA: 1.02 (ref 1.010–1.025)
Urobilinogen, UA: 0.2 E.U./dL
pH, UA: 6 (ref 5.0–8.0)

## 2019-12-02 LAB — POCT GLYCOSYLATED HEMOGLOBIN (HGB A1C): Hemoglobin A1C: 10.4 % — AB (ref 4.0–5.6)

## 2019-12-02 LAB — GLUCOSE, POCT (MANUAL RESULT ENTRY): POC Glucose: 279 mg/dl — AB (ref 70–99)

## 2019-12-02 MED ORDER — GABAPENTIN 300 MG PO CAPS: 300.0000 mg | ORAL_CAPSULE | Freq: Two times a day (BID) | ORAL | 6 refills | Status: DC

## 2019-12-02 MED ORDER — QUETIAPINE FUMARATE 100 MG PO TABS: 100.0000 mg | ORAL_TABLET | Freq: Every day | ORAL | 6 refills | Status: DC

## 2019-12-02 MED ORDER — INSULIN ASP PROT & ASP FLEXPEN (70-30) 100 UNIT/ML ~~LOC~~ SUPN: 30.0000 [IU] | PEN_INJECTOR | Freq: Two times a day (BID) | SUBCUTANEOUS | 12 refills | Status: DC

## 2019-12-02 MED ORDER — SILDENAFIL CITRATE 100 MG PO TABS: 100.0000 mg | ORAL_TABLET | Freq: Every day | ORAL | 1 refills | Status: DC | PRN

## 2019-12-02 MED FILL — NOVOLOG MIX 70-30 FLEXPEN S: (70-30) 100 | 15 days supply | Qty: 9 | Fill #0

## 2019-12-02 MED FILL — QUETIAPINE FUMARATE 100 MG: 100 | 30 days supply | Qty: 30 | Fill #0

## 2019-12-02 MED FILL — GABAPENTIN 300 MG CAPSULE: 300 | 30 days supply | Qty: 60 | Fill #0

## 2019-12-02 MED FILL — SILDENAFIL CITRATE 100 MG T: 100 | 30 days supply | Qty: 10 | Fill #0

## 2019-12-02 NOTE — Patient Instructions (Signed)
DASH Eating Plan DASH stands for "Dietary Approaches to Stop Hypertension." The DASH eating plan is a healthy eating plan that has been shown to reduce high blood pressure (hypertension). It may also reduce your risk for type 2 diabetes, heart disease, and stroke. The DASH eating plan may also help with weight loss. What are tips for following this plan?  General guidelines  Avoid eating more than 2,300 mg (milligrams) of salt (sodium) a day. If you have hypertension, you may need to reduce your sodium intake to 1,500 mg a day.  Limit alcohol intake to no more than 1 drink a day for nonpregnant women and 2 drinks a day for men. One drink equals 12 oz of beer, 5 oz of wine, or 1 oz of hard liquor.  Work with your health care provider to maintain a healthy body weight or to lose weight. Ask what an ideal weight is for you.  Get at least 30 minutes of exercise that causes your heart to beat faster (aerobic exercise) most days of the week. Activities may include walking, swimming, or biking.  Work with your health care provider or diet and nutrition specialist (dietitian) to adjust your eating plan to your individual calorie needs. Reading food labels   Check food labels for the amount of sodium per serving. Choose foods with less than 5 percent of the Daily Value of sodium. Generally, foods with less than 300 mg of sodium per serving fit into this eating plan.  To find whole grains, look for the word "whole" as the first word in the ingredient list. Shopping  Buy products labeled as "low-sodium" or "no salt added."  Buy fresh foods. Avoid canned foods and premade or frozen meals. Cooking  Avoid adding salt when cooking. Use salt-free seasonings or herbs instead of table salt or sea salt. Check with your health care provider or pharmacist before using salt substitutes.  Do not fry foods. Cook foods using healthy methods such as baking, boiling, grilling, and broiling instead.  Cook with  heart-healthy oils, such as olive, canola, soybean, or sunflower oil. Meal planning  Eat a balanced diet that includes: ? 5 or more servings of fruits and vegetables each day. At each meal, try to fill half of your plate with fruits and vegetables. ? Up to 6-8 servings of whole grains each day. ? Less than 6 oz of lean meat, poultry, or fish each day. A 3-oz serving of meat is about the same size as a deck of cards. One egg equals 1 oz. ? 2 servings of low-fat dairy each day. ? A serving of nuts, seeds, or beans 5 times each week. ? Heart-healthy fats. Healthy fats called Omega-3 fatty acids are found in foods such as flaxseeds and coldwater fish, like sardines, salmon, and mackerel.  Limit how much you eat of the following: ? Canned or prepackaged foods. ? Food that is high in trans fat, such as fried foods. ? Food that is high in saturated fat, such as fatty meat. ? Sweets, desserts, sugary drinks, and other foods with added sugar. ? Full-fat dairy products.  Do not salt foods before eating.  Try to eat at least 2 vegetarian meals each week.  Eat more home-cooked food and less restaurant, buffet, and fast food.  When eating at a restaurant, ask that your food be prepared with less salt or no salt, if possible. What foods are recommended? The items listed may not be a complete list. Talk with your dietitian about   what dietary choices are best for you. Grains Whole-grain or whole-wheat bread. Whole-grain or whole-wheat pasta. Brown rice. Oatmeal. Quinoa. Bulgur. Whole-grain and low-sodium cereals. Pita bread. Low-fat, low-sodium crackers. Whole-wheat flour tortillas. Vegetables Fresh or frozen vegetables (raw, steamed, roasted, or grilled). Low-sodium or reduced-sodium tomato and vegetable juice. Low-sodium or reduced-sodium tomato sauce and tomato paste. Low-sodium or reduced-sodium canned vegetables. Fruits All fresh, dried, or frozen fruit. Canned fruit in natural juice (without  added sugar). Meat and other protein foods Skinless chicken or turkey. Ground chicken or turkey. Pork with fat trimmed off. Fish and seafood. Egg whites. Dried beans, peas, or lentils. Unsalted nuts, nut butters, and seeds. Unsalted canned beans. Lean cuts of beef with fat trimmed off. Low-sodium, lean deli meat. Dairy Low-fat (1%) or fat-free (skim) milk. Fat-free, low-fat, or reduced-fat cheeses. Nonfat, low-sodium ricotta or cottage cheese. Low-fat or nonfat yogurt. Low-fat, low-sodium cheese. Fats and oils Soft margarine without trans fats. Vegetable oil. Low-fat, reduced-fat, or light mayonnaise and salad dressings (reduced-sodium). Canola, safflower, olive, soybean, and sunflower oils. Avocado. Seasoning and other foods Herbs. Spices. Seasoning mixes without salt. Unsalted popcorn and pretzels. Fat-free sweets. What foods are not recommended? The items listed may not be a complete list. Talk with your dietitian about what dietary choices are best for you. Grains Baked goods made with fat, such as croissants, muffins, or some breads. Dry pasta or rice meal packs. Vegetables Creamed or fried vegetables. Vegetables in a cheese sauce. Regular canned vegetables (not low-sodium or reduced-sodium). Regular canned tomato sauce and paste (not low-sodium or reduced-sodium). Regular tomato and vegetable juice (not low-sodium or reduced-sodium). Pickles. Olives. Fruits Canned fruit in a light or heavy syrup. Fried fruit. Fruit in cream or butter sauce. Meat and other protein foods Fatty cuts of meat. Ribs. Fried meat. Bacon. Sausage. Bologna and other processed lunch meats. Salami. Fatback. Hotdogs. Bratwurst. Salted nuts and seeds. Canned beans with added salt. Canned or smoked fish. Whole eggs or egg yolks. Chicken or turkey with skin. Dairy Whole or 2% milk, cream, and half-and-half. Whole or full-fat cream cheese. Whole-fat or sweetened yogurt. Full-fat cheese. Nondairy creamers. Whipped toppings.  Processed cheese and cheese spreads. Fats and oils Butter. Stick margarine. Lard. Shortening. Ghee. Bacon fat. Tropical oils, such as coconut, palm kernel, or palm oil. Seasoning and other foods Salted popcorn and pretzels. Onion salt, garlic salt, seasoned salt, table salt, and sea salt. Worcestershire sauce. Tartar sauce. Barbecue sauce. Teriyaki sauce. Soy sauce, including reduced-sodium. Steak sauce. Canned and packaged gravies. Fish sauce. Oyster sauce. Cocktail sauce. Horseradish that you find on the shelf. Ketchup. Mustard. Meat flavorings and tenderizers. Bouillon cubes. Hot sauce and Tabasco sauce. Premade or packaged marinades. Premade or packaged taco seasonings. Relishes. Regular salad dressings. Where to find more information:  National Heart, Lung, and Blood Institute: www.nhlbi.nih.gov  American Heart Association: www.heart.org Summary  The DASH eating plan is a healthy eating plan that has been shown to reduce high blood pressure (hypertension). It may also reduce your risk for type 2 diabetes, heart disease, and stroke.  With the DASH eating plan, you should limit salt (sodium) intake to 2,300 mg a day. If you have hypertension, you may need to reduce your sodium intake to 1,500 mg a day.  When on the DASH eating plan, aim to eat more fresh fruits and vegetables, whole grains, lean proteins, low-fat dairy, and heart-healthy fats.  Work with your health care provider or diet and nutrition specialist (dietitian) to adjust your eating plan to your   individual calorie needs. This information is not intended to replace advice given to you by your health care provider. Make sure you discuss any questions you have with your health care provider. Document Revised: 10/26/2017 Document Reviewed: 11/06/2016 Elsevier Patient Education  [place]. Sildenafil tablets (Erectile Dysfunction) What is this medicine? SILDENAFIL (sil DEN a fil) is used to treat erection problems in  men. This medicine may be used for other purposes; ask your health care provider or pharmacist if you have questions. COMMON BRAND NAME(S): Viagra What should I tell my health care provider before I take this medicine? They need to know if you have any of these conditions:  bleeding disorders  eye or vision problems, including a rare inherited eye disease called retinitis pigmentosa  anatomical deformation of the penis, Peyronie's disease, or history of priapism (painful and prolonged erection)  heart disease, angina, a history of heart attack, irregular heart beats, or other heart problems  high or low blood pressure  history of blood diseases, like sickle cell anemia or leukemia  history of stomach bleeding  kidney disease  liver disease  stroke  an unusual or allergic reaction to sildenafil, other medicines, foods, dyes, or preservatives  pregnant or trying to get pregnant  breast-feeding How should I use this medicine? Take this medicine by mouth with a glass of water. Follow the directions on the prescription label. The dose is usually taken 1 hour before sexual activity. You should not take the dose more than once per day. Do not take your medicine more often than directed. Talk to your pediatrician regarding the use of this medicine in children. This medicine is not used in children for this condition. Overdosage: If you think you have taken too much of this medicine contact a poison control center or emergency room at once. NOTE: This medicine is only for you. Do not share this medicine with others. What if I miss a dose? This does not apply. Do not take double or extra doses. What may interact with this medicine? Do not take this medicine with any of the following medications:  cisapride  nitrates like amyl nitrite, isosorbide dinitrate, isosorbide mononitrate, nitroglycerin  riociguat This medicine may also interact with the following medications:  antiviral  medicines for HIV or AIDS  bosentan  certain medicines for benign prostatic hyperplasia (BPH)  certain medicines for blood pressure  certain medicines for fungal infections like ketoconazole and itraconazole  cimetidine  erythromycin  rifampin This list may not describe all possible interactions. Give your health care provider a list of all the medicines, herbs, non-prescription drugs, or dietary supplements you use. Also tell them if you smoke, drink alcohol, or use illegal drugs. Some items may interact with your medicine. What should I watch for while using this medicine? If you notice any changes in your vision while taking this drug, call your doctor or health care professional as soon as possible. Stop using this medicine and call your health care provider right away if you have a loss of sight in one or both eyes. Contact your doctor or health care professional right away if you have an erection that lasts longer than 4 hours or if it becomes painful. This may be a sign of a serious problem and must be treated right away to prevent permanent damage. If you experience symptoms of nausea, dizziness, chest pain or arm pain upon initiation of sexual activity after taking this medicine, you should refrain from further activity and call your  doctor or health care professional as soon as possible. Do not drink alcohol to excess (examples, 5 glasses of wine or 5 shots of whiskey) when taking this medicine. When taken in excess, alcohol can increase your chances of getting a headache or getting dizzy, increasing your heart rate or lowering your blood pressure. Using this medicine does not protect you or your partner against HIV infection (the virus that causes AIDS) or other sexually transmitted diseases. What side effects may I notice from receiving this medicine? Side effects that you should report to your doctor or health care professional as soon as possible:  allergic reactions like skin  rash, itching or hives, swelling of the face, lips, or tongue  breathing problems  changes in hearing  changes in vision  chest pain  fast, irregular heartbeat  prolonged or painful erection  seizures Side effects that usually do not require medical attention (report to your doctor or health care professional if they continue or are bothersome):  back pain  dizziness  flushing  headache  indigestion  muscle aches  nausea  stuffy or runny nose This list may not describe all possible side effects. Call your doctor for medical advice about side effects. You may report side effects to FDA at 1-800-FDA-1088. Where should I keep my medicine? Keep out of reach of children. Store at room temperature between 15 and 30 degrees C (59 and 86 degrees F). Throw away any unused medicine after the expiration date. NOTE: This sheet is a summary. It may not cover all possible information. If you have questions about this medicine, talk to your doctor, pharmacist, or health care provider.  2020 Elsevier/Gold Standard (2015-10-27 12:00:25)

## 2019-12-02 NOTE — Progress Notes (Signed)
Patient [facility]   New Patient--Hospital Follow Up--Establish Care  Subjective:  Patient ID: [name], male    DOB: [birth year]  Age: 59 y.o. MRN: [id number]  CC:  Chief Complaint  Patient presents with  . New Patient (Initial Visit)    Est care  . Hospitalization Follow-up    10/11/2019 discharged Dx CHF    HPI [name] is a 59 year old male who presents for Hospital Follow Up and to Establish Care today.  Past Medical History:  Diagnosis Date  . Anxiety   . CAD (coronary artery disease)   . CHF (congestive heart failure) ([place]) 09/2019  . Depression   . Diabetes mellitus   . Erectile dysfunction 11/2019  . H/O right heart catheterization 09/2019  . Hypertension   . Schizophrenia ([place])    Current Status: This will be [name]'s initial office visit with me. He was previously seeing Dr. Jeanie Cooks for his PCP needs. Since his last office visit, he has had a Hospital Admission for CHF on 10/11/2019-10/16/2019. He has seen followed up with Cardiologist on 10/31/2019, with additional follow up on 12/16/2019. Today, he is doing well with no complaints. His anxiety is mild today. He is currently seeing a Teacher, music for Mental Health issues. He denies suicidal ideations, homicidal ideations, or auditory hallucinations. He denies fevers, chills, fatigue, recent infections, weight loss, and night sweats. He has not had any headaches, visual changes, dizziness, and falls. No chest pain, heart palpitations, cough and shortness of breath reported. No reports of GI problems such as nausea, vomiting, diarrhea, and constipation. He has no reports of blood in stools, dysuria and hematuria. He denies pain today.   Past Surgical History:  Procedure Laterality Date  . CORONARY STENT PLACEMENT    . HERNIA REPAIR    . RIGHT/LEFT HEART CATH AND CORONARY ANGIOGRAPHY N/A 10/14/2019   Procedure: RIGHT/LEFT HEART CATH AND CORONARY  ANGIOGRAPHY;  Surgeon: Belva Crome, MD;  Location: [facility];  Service: Cardiovascular;  Laterality: N/A;    Family History  Problem Relation Age of Onset  . Heart failure Mother   . Mental illness Sister   . Mental illness Sister     Social History   Socioeconomic History  . Marital status: Legally Separated    Spouse name: Not on file  . Number of children: Not on file  . Years of education: Not on file  . Highest education level: Not on file  Occupational History  . Not on file  Tobacco Use  . 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 Smokeless tobacco: Never Used  Substance and Sexual Activity  . Alcohol use: No  . Drug use: No  . Sexual activity: Yes  Other Topics Concern  . Not on file  Social History Narrative  . Not on file   Social Determinants of Health   Financial Resource Strain:   . Difficulty of Paying Living Expenses: Not on file  Food Insecurity:   . Worried About Charity fundraiser in the Last Year: Not on file  . Ran Out of Food in the Last Year: Not on file  Transportation Needs:   . Lack of Transportation (Medical): Not on file  . Lack of Transportation (Non-Medical): Not on file  Physical Activity:   . Days of Exercise per Week: Not on file  . Minutes of Exercise per Session: Not on file  Stress:   . Feeling of Stress :  Not on file  Social Connections:   . Frequency of Communication with Friends and Family: Not on file  . Frequency of Social Gatherings with Friends and Family: Not on file  . Attends Religious Services: Not on file  . Active Member of Clubs or Organizations: Not on file  . Attends Archivist Meetings: Not on file  . Marital Status: Not on file  Intimate Partner Violence:   . Fear of Current or Ex-Partner: Not on file  . Emotionally Abused: Not on file  . Physically Abused: Not on file  . Sexually Abused: Not on file    Outpatient Medications Prior to Visit  Medication Sig Dispense Refill  . apixaban  (ELIQUIS) 5 MG TABS tablet Take 1 tablet (5 mg total) by mouth 2 (two) times daily. 180 tablet 3  . atorvastatin (LIPITOR) 40 MG tablet Take 1 tablet (40 mg total) by mouth daily at 6 PM. 90 tablet 3  . BLACK CURRANT SEED OIL PO Take by mouth daily. 1 tsp every day    . blood glucose meter kit and supplies KIT Dispense based on patient and insurance preference. Use up to four times daily as directed. (FOR ICD-9 250.00, 250.01). 1 each 0  . carvedilol (COREG) 6.25 MG tablet Take 1 tablet (6.25 mg total) by mouth 2 (two) times daily with a meal. 180 tablet 3  . furosemide (LASIX) 40 MG tablet Take 1 tablet (40 mg total) by mouth 2 (two) times daily. 60 tablet 6  . ibuprofen (ADVIL) 400 MG tablet Take 400 mg by mouth every 6 (six) hours as needed.    . Insulin Aspart Prot & Aspart (INSULIN ASP PROT & ASP FLEXPEN) (70-30) 100 UNIT/ML SUPN Inject 15 Units into the skin 2 (two) times daily before a meal. 15 mL 0  . Insulin Pen Needle (PEN NEEDLES) 30G X 5 MM MISC 1 each by Does not apply route 2 times daily at 12 noon and 4 pm. 100 each 0  . potassium chloride SA (KLOR-CON) 20 MEQ tablet Take 1 tablet (20 mEq total) by mouth 2 (two) times daily. 60 tablet 6  . spironolactone (ALDACTONE) 25 MG tablet Take 0.5 tablets (12.5 mg total) by mouth daily. 45 tablet 3  . QUEtiapine (SEROQUEL) 50 MG tablet Take 1 tablet (50 mg total) by mouth at bedtime. 30 tablet 0   No facility-administered medications prior to visit.    No Known Allergies  ROS Review of Systems  Constitutional: Negative.   HENT: Negative.   Eyes: Negative.   Respiratory: Negative.   Cardiovascular: Negative.   Gastrointestinal: Positive for abdominal distention.  Endocrine: Positive for polydipsia, polyphagia and polyuria.  Genitourinary: Negative.   Musculoskeletal: Negative.   Skin: Negative.   Allergic/Immunologic: Negative.   Neurological: Negative.   Hematological: Negative.   Psychiatric/Behavioral: Negative.         Objective:    Physical Exam  Constitutional: He is oriented to person, place, and time. He appears well-developed and well-nourished.  HENT:  Head: Normocephalic and atraumatic.  Eyes: Conjunctivae are normal.  Cardiovascular: Normal rate, regular rhythm, normal heart sounds and intact distal pulses.  Pulmonary/Chest: Effort normal and breath sounds normal.  Abdominal: Soft. Bowel sounds are normal. He exhibits distension (obese).  Musculoskeletal:        General: Normal range of motion.     Cervical back: Normal range of motion and neck supple.  Neurological: He is alert and oriented to person, place, and time. He has normal  reflexes.  Skin: Skin is warm and dry.  Psychiatric: He has a normal mood and affect. His behavior is normal. Judgment and thought content normal.  Nursing note and vitals reviewed.   BP 136/88   Pulse 85   Temp 98 F (36.7 C) (Oral)   Resp 20   Ht '6\' 2"'  (1.88 m)   Wt 293 lb (132.9 kg)   SpO2 97%   BMI 37.62 kg/m  Wt Readings from Last 3 Encounters:  12/02/19 293 lb (132.9 kg)  11/10/19 297 lb (134.7 kg)  11/03/19 297 lb 12.8 oz (135.1 kg)     Health Maintenance Due  Topic Date Due  . Hepatitis C Screening  1961/09/30  . FOOT EXAM  04/24/1970  . OPHTHALMOLOGY EXAM  04/24/1970  . URINE MICROALBUMIN  04/24/1970  . HIV Screening  04/25/1975  . TETANUS/TDAP  04/25/1979  . COLONOSCOPY  04/24/2010    There are no preventive care reminders to display for this patient.  Lab Results  Component Value Date   TSH 4.110 12/02/2019   Lab Results  Component Value Date   WBC 6.4 12/02/2019   HGB 11.3 (L) 12/02/2019   HCT 36.1 (L) 12/02/2019   MCV 79 12/02/2019   PLT 306 12/02/2019   Lab Results  Component Value Date   NA 139 12/02/2019   K 4.7 12/02/2019   CO2 21 12/02/2019   GLUCOSE 271 (H) 12/02/2019   BUN 9 12/02/2019   CREATININE 1.32 (H) 12/02/2019   BILITOT 0.5 12/02/2019   ALKPHOS 128 (H) 12/02/2019   AST 21 12/02/2019   ALT 23  12/02/2019   PROT 7.1 12/02/2019   ALBUMIN 3.6 (L) 12/02/2019   CALCIUM 9.3 12/02/2019   ANIONGAP 8 10/16/2019   Lab Results  Component Value Date   CHOL 120 12/02/2019   Lab Results  Component Value Date   HDL 42 12/02/2019   Lab Results  Component Value Date   LDLCALC 66 12/02/2019   Lab Results  Component Value Date   TRIG 51 12/02/2019   Lab Results  Component Value Date   CHOLHDL 2.9 12/02/2019   Lab Results  Component Value Date   HGBA1C 10.4 (A) 12/02/2019      Assessment & Plan:   1. Hospital discharge follow-up  2. Encounter to establish care  3. Chronic systolic congestive heart failure ([place])  4. Type 2 diabetes mellitus with hyperglycemia, with long-term current use of insulin ([place]) He will continue medication as prescribed, to decrease foods/beverages high in sugars and carbs and follow Heart Healthy or DASH diet. Increase physical activity to at least 30 minutes cardio exercise daily.  - CBC with Differential - Insulin Aspart Prot & Aspart (INSULIN ASP PROT & ASP FLEXPEN) (70-30) 100 UNIT/ML SUPN; Inject 30 Units into the skin 2 (two) times daily.  Dispense: 3 mL; Refill: 12  5. Diabetic polyneuropathy associated with type 2 diabetes mellitus (HCC) - gabapentin (NEURONTIN) 300 MG capsule; Take 1 capsule (300 mg total) by mouth 2 (two) times daily.  Dispense: 60 capsule; Refill: 6  6. Hyperglycemia Blood glucose level is 279 today.  - Glucose (CBG)  7. History of hyperglycemia - POCT HgB A1C  8. Hemoglobin A1c greater than 9.0% Hgb A1c at 10.4 today. We will continue to monitor.   9. Paranoid schizophrenia, chronic condition ([place]) Stable. He will continue to follow up with Psychiatrist as needed.  - QUEtiapine (SEROQUEL) 100 MG tablet; Take 1 tablet (100 mg total) by mouth at bedtime.  Dispense: 30 tablet; Refill: 6  10. GAD (generalized anxiety disorder) - QUEtiapine (SEROQUEL) 100 MG tablet; Take 1 tablet (100 mg total) by mouth at bedtime.   Dispense: 30 tablet; Refill: 6  11. Panic disorder without agoraphobia We will increase Quetiapine to 100 mg today.  - QUEtiapine (SEROQUEL) 100 MG tablet; Take 1 tablet (100 mg total) by mouth at bedtime.  Dispense: 30 tablet; Refill: 6  12. Erectile dysfunction, unspecified erectile dysfunction type - sildenafil (VIAGRA) 100 MG tablet; Take 1 tablet (100 mg total) by mouth daily as needed for erectile dysfunction.  Dispense: 10 tablet; Refill: 1  13. Health care maintenance - Urinalysis Dipstick  14. Follow up He will follow up in 3 months.  Meds ordered this encounter  Medications  . gabapentin (NEURONTIN) 300 MG capsule    Sig: Take 1 capsule (300 mg total) by mouth 2 (two) times daily.    Dispense:  60 capsule    Refill:  6  . sildenafil (VIAGRA) 100 MG tablet    Sig: Take 1 tablet (100 mg total) by mouth daily as needed for erectile dysfunction.    Dispense:  10 tablet    Refill:  1  . QUEtiapine (SEROQUEL) 100 MG tablet    Sig: Take 1 tablet (100 mg total) by mouth at bedtime.    Dispense:  30 tablet    Refill:  6  . Insulin Aspart Prot & Aspart (INSULIN ASP PROT & ASP FLEXPEN) (70-30) 100 UNIT/ML SUPN    Sig: Inject 30 Units into the skin 2 (two) times daily.    Dispense:  3 mL    Refill:  12   Orders Placed This Encounter  Procedures  . Comp Met (CMET)  . CBC with Differential  . Lipid Panel  . PSA  . TSH  . Vitamin D, 25-hydroxy  . Vitamin B12  . POCT HgB A1C  . Urinalysis Dipstick  . Glucose (CBG)    Referral Orders  No referral(s) requested today    Kathe Becton,  MSN, FNP-BC [place],  [id number] [phone]- fax   Problem List Items Addressed This Visit      Cardiovascular and Mediastinum   CHF (congestive heart failure) (HCC)   Relevant Medications   sildenafil (VIAGRA) 100 MG tablet     Endocrine   Diabetic  polyneuropathy associated with type 2 diabetes mellitus (HCC)   Relevant Medications   gabapentin (NEURONTIN) 300 MG capsule   QUEtiapine (SEROQUEL) 100 MG tablet   Insulin Aspart Prot & Aspart (INSULIN ASP PROT & ASP FLEXPEN) (70-30) 100 UNIT/ML SUPN   Type 2 diabetes mellitus with hyperglycemia, with long-term current use of insulin (HCC)   Relevant Medications   Insulin Aspart Prot & Aspart (INSULIN ASP PROT & ASP FLEXPEN) (70-30) 100 UNIT/ML SUPN   Other Relevant Orders   CBC with Differential (Completed)     Other   Erectile dysfunction   Relevant Medications   sildenafil (VIAGRA) 100 MG tablet   GAD (generalized anxiety disorder)   Relevant Medications   QUEtiapine (SEROQUEL) 100 MG tablet   History of hyperglycemia   Relevant Orders   POCT HgB A1C (Completed)   Hyperglycemia   Relevant Orders   Glucose (CBG) (Completed)   Panic disorder without agoraphobia   Relevant Medications   QUEtiapine (SEROQUEL) 100 MG tablet   Paranoid schizophrenia, chronic condition ([place])   Relevant Medications  QUEtiapine (SEROQUEL) 100 MG tablet    Other Visit Diagnoses    Hospital discharge follow-up    -  Primary   Encounter to establish care       Hemoglobin A1c greater than 9.0%       Health care maintenance       Relevant Orders   Urinalysis Dipstick (Completed)   Follow up       Healthcare maintenance       Relevant Orders   Comp Met (CMET) (Completed)   Lipid Panel (Completed)   PSA (Completed)   TSH (Completed)   Vitamin D deficiency, unspecified       Relevant Orders   Vitamin D, 25-hydroxy (Completed)   Vitamin B 12 deficiency       Relevant Orders   Vitamin B12 (Completed)      Meds ordered this encounter  Medications  . gabapentin (NEURONTIN) 300 MG capsule    Sig: Take 1 capsule (300 mg total) by mouth 2 (two) times daily.    Dispense:  60 capsule    Refill:  6  . sildenafil (VIAGRA) 100 MG tablet    Sig: Take 1 tablet (100 mg total) by mouth daily as  needed for erectile dysfunction.    Dispense:  10 tablet    Refill:  1  . QUEtiapine (SEROQUEL) 100 MG tablet    Sig: Take 1 tablet (100 mg total) by mouth at bedtime.    Dispense:  30 tablet    Refill:  6  . Insulin Aspart Prot & Aspart (INSULIN ASP PROT & ASP FLEXPEN) (70-30) 100 UNIT/ML SUPN    Sig: Inject 30 Units into the skin 2 (two) times daily.    Dispense:  3 mL    Refill:  12    Follow-up: Return in about 3 months (around 03/01/2020).    Azzie Glatter, FNP

## 2019-12-03 DIAGNOSIS — E1165 Type 2 diabetes mellitus with hyperglycemia: Secondary | ICD-10-CM | POA: Insufficient documentation

## 2019-12-03 DIAGNOSIS — Z8639 Personal history of other endocrine, nutritional and metabolic disease: Secondary | ICD-10-CM | POA: Insufficient documentation

## 2019-12-03 DIAGNOSIS — N529 Male erectile dysfunction, unspecified: Secondary | ICD-10-CM | POA: Insufficient documentation

## 2019-12-03 DIAGNOSIS — E1142 Type 2 diabetes mellitus with diabetic polyneuropathy: Secondary | ICD-10-CM | POA: Insufficient documentation

## 2019-12-03 DIAGNOSIS — R739 Hyperglycemia, unspecified: Secondary | ICD-10-CM | POA: Insufficient documentation

## 2019-12-03 DIAGNOSIS — Z794 Long term (current) use of insulin: Secondary | ICD-10-CM | POA: Insufficient documentation

## 2019-12-03 LAB — CBC WITH DIFFERENTIAL/PLATELET
Basophils Absolute: 0 10*3/uL (ref 0.0–0.2)
Basos: 1 %
EOS (ABSOLUTE): 0.2 10*3/uL (ref 0.0–0.4)
Eos: 4 %
Hematocrit: 36.1 % — ABNORMAL LOW (ref 37.5–51.0)
Hemoglobin: 11.3 g/dL — ABNORMAL LOW (ref 13.0–17.7)
Immature Grans (Abs): 0 10*3/uL (ref 0.0–0.1)
Immature Granulocytes: 0 %
Lymphocytes Absolute: 1.4 10*3/uL (ref 0.7–3.1)
Lymphs: 22 %
MCH: 24.8 pg — ABNORMAL LOW (ref 26.6–33.0)
MCHC: 31.3 g/dL — ABNORMAL LOW (ref 31.5–35.7)
MCV: 79 fL (ref 79–97)
Monocytes Absolute: 0.4 10*3/uL (ref 0.1–0.9)
Monocytes: 6 %
Neutrophils Absolute: 4.3 10*3/uL (ref 1.4–7.0)
Neutrophils: 67 %
Platelets: 306 10*3/uL (ref 150–450)
RBC: 4.56 x10E6/uL (ref 4.14–5.80)
RDW: 13.9 % (ref 11.6–15.4)
WBC: 6.4 10*3/uL (ref 3.4–10.8)

## 2019-12-03 LAB — COMPREHENSIVE METABOLIC PANEL
ALT: 23 IU/L (ref 0–44)
AST: 21 IU/L (ref 0–40)
Albumin/Globulin Ratio: 1 — ABNORMAL LOW (ref 1.2–2.2)
Albumin: 3.6 g/dL — ABNORMAL LOW (ref 3.8–4.9)
Alkaline Phosphatase: 128 IU/L — ABNORMAL HIGH (ref 39–117)
BUN/Creatinine Ratio: 7 — ABNORMAL LOW (ref 9–20)
BUN: 9 mg/dL (ref 6–24)
Bilirubin Total: 0.5 mg/dL (ref 0.0–1.2)
CO2: 21 mmol/L (ref 20–29)
Calcium: 9.3 mg/dL (ref 8.7–10.2)
Chloride: 105 mmol/L (ref 96–106)
Creatinine, Ser: 1.32 mg/dL — ABNORMAL HIGH (ref 0.76–1.27)
GFR calc Af Amer: 68 mL/min/{1.73_m2} (ref >59)
GFR calc non Af Amer: 59 mL/min/{1.73_m2} — ABNORMAL LOW (ref >59)
Globulin, Total: 3.5 g/dL (ref 1.5–4.5)
Glucose: 271 mg/dL — ABNORMAL HIGH (ref 65–99)
Potassium: 4.7 mmol/L (ref 3.5–5.2)
Sodium: 139 mmol/L (ref 134–144)
Total Protein: 7.1 g/dL (ref 6.0–8.5)

## 2019-12-03 LAB — LIPID PANEL
Chol/HDL Ratio: 2.9 ratio (ref 0.0–5.0)
Cholesterol, Total: 120 mg/dL (ref 100–199)
HDL: 42 mg/dL (ref >39)
LDL Chol Calc (NIH): 66 mg/dL (ref 0–99)
Triglycerides: 51 mg/dL (ref 0–149)
VLDL Cholesterol Cal: 12 mg/dL (ref 5–40)

## 2019-12-03 LAB — VITAMIN D 25 HYDROXY (VIT D DEFICIENCY, FRACTURES): Vit D, 25-Hydroxy: 21.9 ng/mL — ABNORMAL LOW (ref 30.0–100.0)

## 2019-12-03 LAB — PSA: Prostate Specific Ag, Serum: 0.4 ng/mL (ref 0.0–4.0)

## 2019-12-03 LAB — TSH: TSH: 4.11 u[IU]/mL (ref 0.450–4.500)

## 2019-12-03 LAB — VITAMIN B12: Vitamin B-12: 646 pg/mL (ref 232–1245)

## 2019-12-04 NOTE — Telephone Encounter (Signed)
Azzie Glatter, FNP  Jorja Loa, RMA  Vitamin D level is extremely low. Inform patient to take OTC Vitamin D supplement daily. He should include foods that are high in Vitamin D. These include: Salmon, Cod Liver Oil, Mushrooms, Canned Fish, Milk, and Egg Yolks.   All other labs are stable. Keep follow up appointment. Please inform patient. Thank you.    *Message left for call back. No voice ID on phone.

## 2019-12-05 ENCOUNTER — Telehealth: Payer: Self-pay

## 2019-12-05 NOTE — Telephone Encounter (Signed)
I called Mr. Turin again and no answer.

## 2019-12-05 NOTE — Telephone Encounter (Signed)
Message left for patient to call back test results.

## 2019-12-05 NOTE — Telephone Encounter (Signed)
Patient aware of labs.  

## 2019-12-05 NOTE — Telephone Encounter (Signed)
-----   Message from Azzie Glatter, FNP sent at 12/04/2019  1:01 PM EST ----- Vitamin D level is extremely low. Inform patient to take OTC Vitamin D supplement daily. He should include foods that are high in Vitamin D. These include: Salmon, Cod Liver Oil, Mushrooms, Canned Fish, Milk, and Egg Yolks.  All other labs are stable. Keep follow up appointment. Please inform patient. Thank you.

## 2019-12-15 ENCOUNTER — Telehealth (HOSPITAL_COMMUNITY): Payer: Self-pay

## 2019-12-15 ENCOUNTER — Telehealth (HOSPITAL_COMMUNITY): Payer: Self-pay | Admitting: Licensed Clinical Social Worker

## 2019-12-15 NOTE — Telephone Encounter (Signed)

## 2019-12-15 NOTE — Telephone Encounter (Signed)
CSW received call from pt wanting to confirm appt information for tomorrow.  CSW explained clinic location and confirmed appt time with pt.  Pt expressed understanding and no barriers to attending tomorrow.  CSW will continue to follow and assist as needed  [name], [facility] Desk#: [phone] Cell#: [phone]

## 2019-12-16 ENCOUNTER — Ambulatory Visit (HOSPITAL_COMMUNITY)
Admission: RE | Admit: 2019-12-16 | Discharge: 2019-12-16 | Disposition: A | Discharge status: Home / Self Care | Payer: Self-pay | Source: Ambulatory Visit | Attending: Cardiology | Admitting: Cardiology

## 2019-12-16 ENCOUNTER — Other Ambulatory Visit: Payer: Self-pay

## 2019-12-16 ENCOUNTER — Encounter (HOSPITAL_COMMUNITY): Payer: Self-pay | Admitting: Cardiology

## 2019-12-16 VITALS — BP 123/85 | HR 72 | Wt 330.4 lb

## 2019-12-16 DIAGNOSIS — I480 Paroxysmal atrial fibrillation: Secondary | ICD-10-CM | POA: Insufficient documentation

## 2019-12-16 DIAGNOSIS — I2510 Atherosclerotic heart disease of native coronary artery without angina pectoris: Secondary | ICD-10-CM | POA: Insufficient documentation

## 2019-12-16 DIAGNOSIS — Z87891 Personal history of nicotine dependence: Secondary | ICD-10-CM | POA: Insufficient documentation

## 2019-12-16 DIAGNOSIS — I5022 Chronic systolic (congestive) heart failure: Secondary | ICD-10-CM | POA: Insufficient documentation

## 2019-12-16 DIAGNOSIS — I110 Hypertensive heart disease with heart failure: Secondary | ICD-10-CM | POA: Insufficient documentation

## 2019-12-16 DIAGNOSIS — E785 Hyperlipidemia, unspecified: Secondary | ICD-10-CM | POA: Insufficient documentation

## 2019-12-16 DIAGNOSIS — N529 Male erectile dysfunction, unspecified: Secondary | ICD-10-CM

## 2019-12-16 DIAGNOSIS — Z7901 Long term (current) use of anticoagulants: Secondary | ICD-10-CM | POA: Insufficient documentation

## 2019-12-16 DIAGNOSIS — Z8249 Family history of ischemic heart disease and other diseases of the circulatory system: Secondary | ICD-10-CM | POA: Insufficient documentation

## 2019-12-16 DIAGNOSIS — Z79899 Other long term (current) drug therapy: Secondary | ICD-10-CM | POA: Insufficient documentation

## 2019-12-16 DIAGNOSIS — R0602 Shortness of breath: Secondary | ICD-10-CM | POA: Insufficient documentation

## 2019-12-16 DIAGNOSIS — E119 Type 2 diabetes mellitus without complications: Secondary | ICD-10-CM | POA: Insufficient documentation

## 2019-12-16 LAB — BASIC METABOLIC PANEL
Anion gap: 8 (ref 5–15)
BUN: 16 mg/dL (ref 6–20)
CO2: 24 mmol/L (ref 22–32)
Calcium: 8.5 mg/dL — ABNORMAL LOW (ref 8.9–10.3)
Chloride: 107 mmol/L (ref 98–111)
Creatinine, Ser: 1.2 mg/dL (ref 0.61–1.24)
GFR calc Af Amer: >60 mL/min (ref >60)
GFR calc non Af Amer: >60 mL/min (ref >60)
Glucose, Bld: 95 mg/dL (ref 70–99)
Potassium: 4 mmol/L (ref 3.5–5.1)
Sodium: 139 mmol/L (ref 135–145)

## 2019-12-16 LAB — BRAIN NATRIURETIC PEPTIDE: B Natriuretic Peptide: 1878.9 pg/mL — ABNORMAL HIGH (ref 0.0–100.0)

## 2019-12-16 MED ORDER — SPIRONOLACTONE 25 MG PO TABS: 12.5000 mg | ORAL_TABLET | Freq: Every day | ORAL | 3 refills | Status: DC

## 2019-12-16 MED ORDER — SACUBITRIL-VALSARTAN 24-26 MG PO TABS: 1.0000 | ORAL_TABLET | Freq: Two times a day (BID) | ORAL | 3 refills | Status: DC

## 2019-12-16 MED ORDER — SILDENAFIL CITRATE 100 MG PO TABS: 100.0000 mg | ORAL_TABLET | Freq: Every day | ORAL | 1 refills | Status: DC | PRN

## 2019-12-16 MED ORDER — POTASSIUM CHLORIDE CRYS ER 20 MEQ PO TBCR: 20.0000 meq | EXTENDED_RELEASE_TABLET | Freq: Two times a day (BID) | ORAL | 6 refills | Status: DC

## 2019-12-16 MED ORDER — FUROSEMIDE 40 MG PO TABS: 40.0000 mg | ORAL_TABLET | Freq: Two times a day (BID) | ORAL | 6 refills | Status: DC

## 2019-12-16 MED ORDER — SACUBITRIL-VALSARTAN 24-26 MG PO TABS: 1.0000 | ORAL_TABLET | Freq: Two times a day (BID) | ORAL | 5 refills | Status: DC

## 2019-12-16 MED FILL — **ENTRESTO 24-26 MG TABLET: 24-26 | 14 days supply | Qty: 28 | Fill #0

## 2019-12-16 MED FILL — POTASSIUM CL ER 20 MEQ TAB: 20 | 30 days supply | Qty: 60 | Fill #0

## 2019-12-16 MED FILL — SPIRONOLACTONE 25 MG TABLET: 25 | 30 days supply | Qty: 15 | Fill #0

## 2019-12-16 MED FILL — FUROSEMIDE 40 MG TAB: 40 | 30 days supply | Qty: 60 | Fill #0

## 2019-12-16 NOTE — Patient Instructions (Addendum)
RESTART Spironolactone 12.5mg  (1/2 tab) daily   RESTART Potassium 10meq (1 tab) twice a day   TAKE Lasix (Furosemide) 80mg  (2 tabs) twice a day for 2 days, THEN restart Lasix 40mg  (1 tab) twice  a day   START Entresto 24/26mg  ( 1 tab) twice a day. Please take co pay card to pharmacy to receive first month for free.   Labs today and repeat in 1 week at visit We will only contact you if something comes back abnormal or we need to make some changes. Otherwise no news is good news!    You have been referred to the Paramedicine program.   Please call office at [phone] option [phone] if you have any questions or concerns.    At the [facility], you and your health needs are our priority. As part of our continuing mission to provide you with exceptional heart care, we have created designated Provider Care Teams. These Care Teams include your primary Cardiologist (physician) and Advanced Practice Providers (APPs- Physician Assistants and Nurse Practitioners) who all work together to provide you with the care you need, when you need it.   You may see any of the following providers on your designated Care Team at your next follow up: [name] Kitchen Dr Glori Bickers . Dr Loralie Champagne . Darrick Grinder, NP . Lyda Jester, PA . [name], PharmD   Please be sure to bring in all your medications bottles to every appointment.

## 2019-12-16 NOTE — Progress Notes (Signed)
PCP: Nolene Ebbs, MD Cardiology: Dr. Radford Pax HF Cardiology: Dr. Aundra Dubin  59 yo with history of chronic systolic CHF, CAD, type 2 diabetes, paroxysmal atrial fibrillation, and schizophrenia was referred by Dr. Radford Pax for evaluation of CHF.  Patient had OM2 PCI in 2012.  In 11/20, he was admitted with CHF. Echo showed EF 25-30% with diffuse hypokinesis.  LHC was done, showing occluded OM2 at prior stent, 90% D1 stenosis, and extensive diffuse RCA disease.  No intervention.  He was thought to be in paroxysmal atrial fibrillation during this appointment and apixaban was started.   He does not smoke, rarely drinks, and does not use drugs.  His mother had "heart problems."    Recently, he has been out of Lasix, KCl, and spironolactone (several weeks).  Weight is up about 30 lbs.  He has developed abdominal swelling.  He is short of breath walking up a flight of stairs.  No dyspnea walking around his house but gets short of breath walking in a stores like [employer].  No problems with dressing, bathing.  No chest pain.  No palpitations.  He has had episodes of PND. He sleeps on 2 large pillows.   Labs (1/21): LDL 66, HDL 42, hgb 11.3, K 4.7, creatinine 1.32  ECG (12/20): NSR, PACs, lateral TWIs concerning for ischemia.   PMH:  1. Atrial fibrillation: Paroxysmal 2. Type 2 diabetes 3. HTN 4. Hyperlipidemia 5. Schizophrenia 6. CAD: PCI OM2 in 2012.  - LHC (11/20): 90% D1 stenosis, totally occluded OM2 at stent, serial 85%/70%/60% RCA stenoses.  7. 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   - Echo (11/20): EF 25-30%, global hypokinesis.   Social History   Socioeconomic History  . Marital status: Legally Separated    Spouse name: Not on file  . Number of children: Not on file  . Years of education: Not on file  . Highest education level: Not on file  Occupational History  . Not on file  Tobacco Use  . 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 Smokeless tobacco: Never Used   Substance and Sexual Activity  . Alcohol use: No  . Drug use: No  . Sexual activity: Yes  Other Topics Concern  . Not on file  Social History Narrative  . Not on file   Social Determinants of Health   Financial Resource Strain:   . Difficulty of Paying Living Expenses: Not on file  Food Insecurity:   . Worried About Charity fundraiser in the Last Year: Not on file  . Ran Out of Food in the Last Year: Not on file  Transportation Needs:   . Lack of Transportation (Medical): Not on file  . Lack of Transportation (Non-Medical): Not on file  Physical Activity:   . Days of Exercise per Week: Not on file  . Minutes of Exercise per Session: Not on file  Stress:   . Feeling of Stress : Not on file  Social Connections:   . Frequency of Communication with Friends and Family: Not on file  . Frequency of Social Gatherings with Friends and Family: Not on file  . Attends Religious Services: Not on file  . Active Member of Clubs or Organizations: Not on file  . Attends Archivist Meetings: Not on file  . Marital Status: Not on file  Intimate Partner Violence:   . Fear of Current or Ex-Partner: Not on file  . Emotionally Abused: Not on file  . Physically Abused: Not on file  . Sexually  Abused: Not on file   Family History  Problem Relation Age of Onset  . Heart failure Mother   . Mental illness Sister   . Mental illness Sister    ROS: All systems reviewed and negative except as per HPI.   BP 123/85   Pulse 72   Wt (!) 149.9 kg (330 lb 6.4 oz)   SpO2 95%   BMI 42.42 kg/m  General: NAD Neck: JVP 12 cm, no thyromegaly or thyroid nodule.  Lungs: Clear to auscultation bilaterally with normal respiratory effort. CV: Nondisplaced PMI.  Heart regular S1/S2, no S3/S4, no murmur.  2+ edema to knees.  No carotid bruit.  Normal pedal pulses.  Abdomen: Soft, nontender, no hepatosplenomegaly, mild distention.  Skin: Intact without lesions or rashes.  Neurologic: Alert and  oriented x 3.  Psych: Normal affect. Extremities: No clubbing or cyanosis.  HEENT: Normal.   Assessment/Plan: 1. Chronic systolic CHF: Echo in [id number] with EF 25-30%.  Suspect mixed ischemic/nonischemic cardiomyopathy.  NYHA class III symptoms with significant volume overload on exam. Weight is up markedly over the last few weeks.  He has been out of several medications including Lasix for weeks.  - I am going to have a Education officer, museum help him get his medications and will start having him seen by paramedicine.  - Restart spironolactone 12.5 mg daily.  - Take Lasix 80 mg bid x 2 days then 40 mg daily after that.  BMET today and in 1 week.  - Start KCl 20 bid.  - Start Entresto 24/26 bid.  - Continue Coreg 6.25 mg bid.  - Repeat echo in the future after medical management to decide on ICD.  Narrow QRS so not CRT candidate.  2. CAD: Last cath in 11/20 with occluded OM2 stent, 90% D1, severe diffuse disease in the RCA (no intervention).  No chest pain.  - No ASA given apixaban use.  - Continue atorvastatin. Good lipids in 1/21.  3. Atrial fibrillation: Paroxysmal.  He is in NSR today, no palpitations.  - Continue apixaban 5 mg bid.   Given significant volume overload, followup in 1 week.   Loralie Champagne 12/16/2019

## 2019-12-16 NOTE — Progress Notes (Signed)
CSW consulted to speak with pt regarding Entresto assistance as patient doesn't currently have prescription insurance coverage.  Pt provided with 30 day free card for first fill of Entresto and plans to pick up meds from [facility].  CSW assisted pt in filling out [employer] application for patient assistance program.  Form completed and faxed in for review- fax confirmation received- awaiting determination  Pt reports that he has a payee who is helping him to get other insurance coverage- trying to get patient enrolled in Medicare part B this week then will start looking into Medicare part D for patient  No further needs or concerns at this time  [name], [facility] Desk#: [phone] Cell#: [phone]

## 2019-12-23 ENCOUNTER — Telehealth (HOSPITAL_COMMUNITY): Payer: Self-pay

## 2019-12-23 ENCOUNTER — Other Ambulatory Visit: Payer: Self-pay

## 2019-12-23 ENCOUNTER — Ambulatory Visit (HOSPITAL_COMMUNITY)
Admission: RE | Admit: 2019-12-23 | Discharge: 2019-12-23 | Disposition: A | Discharge status: Home / Self Care | Payer: Medicare Other | Source: Ambulatory Visit | Attending: Adult Health | Admitting: Adult Health

## 2019-12-23 ENCOUNTER — Encounter (HOSPITAL_COMMUNITY): Payer: Self-pay

## 2019-12-23 VITALS — BP 132/84 | HR 93 | Wt 300.8 lb

## 2019-12-23 DIAGNOSIS — Z79899 Other long term (current) drug therapy: Secondary | ICD-10-CM | POA: Diagnosis not present

## 2019-12-23 DIAGNOSIS — Z87891 Personal history of nicotine dependence: Secondary | ICD-10-CM | POA: Insufficient documentation

## 2019-12-23 DIAGNOSIS — N529 Male erectile dysfunction, unspecified: Secondary | ICD-10-CM | POA: Insufficient documentation

## 2019-12-23 DIAGNOSIS — I110 Hypertensive heart disease with heart failure: Secondary | ICD-10-CM | POA: Insufficient documentation

## 2019-12-23 DIAGNOSIS — F209 Schizophrenia, unspecified: Secondary | ICD-10-CM | POA: Insufficient documentation

## 2019-12-23 DIAGNOSIS — Z7901 Long term (current) use of anticoagulants: Secondary | ICD-10-CM | POA: Insufficient documentation

## 2019-12-23 DIAGNOSIS — I2510 Atherosclerotic heart disease of native coronary artery without angina pectoris: Secondary | ICD-10-CM

## 2019-12-23 DIAGNOSIS — Z794 Long term (current) use of insulin: Secondary | ICD-10-CM | POA: Diagnosis not present

## 2019-12-23 DIAGNOSIS — I480 Paroxysmal atrial fibrillation: Secondary | ICD-10-CM | POA: Insufficient documentation

## 2019-12-23 DIAGNOSIS — E785 Hyperlipidemia, unspecified: Secondary | ICD-10-CM | POA: Insufficient documentation

## 2019-12-23 DIAGNOSIS — Z8249 Family history of ischemic heart disease and other diseases of the circulatory system: Secondary | ICD-10-CM | POA: Diagnosis not present

## 2019-12-23 DIAGNOSIS — I10 Essential (primary) hypertension: Secondary | ICD-10-CM

## 2019-12-23 DIAGNOSIS — E119 Type 2 diabetes mellitus without complications: Secondary | ICD-10-CM | POA: Insufficient documentation

## 2019-12-23 DIAGNOSIS — I5022 Chronic systolic (congestive) heart failure: Secondary | ICD-10-CM | POA: Diagnosis present

## 2019-12-23 LAB — BASIC METABOLIC PANEL
Anion gap: 12 (ref 5–15)
BUN: 16 mg/dL (ref 6–20)
CO2: 29 mmol/L (ref 22–32)
Calcium: 9 mg/dL (ref 8.9–10.3)
Chloride: 95 mmol/L — ABNORMAL LOW (ref 98–111)
Creatinine, Ser: 1.46 mg/dL — ABNORMAL HIGH (ref 0.61–1.24)
GFR calc Af Amer: >60 mL/min (ref >60)
GFR calc non Af Amer: 52 mL/min — ABNORMAL LOW (ref >60)
Glucose, Bld: 383 mg/dL — ABNORMAL HIGH (ref 70–99)
Potassium: 3.9 mmol/L (ref 3.5–5.1)
Sodium: 136 mmol/L (ref 135–145)

## 2019-12-23 LAB — BRAIN NATRIURETIC PEPTIDE: B Natriuretic Peptide: 1322.6 pg/mL — ABNORMAL HIGH (ref 0.0–100.0)

## 2019-12-23 MED ORDER — CARVEDILOL 6.25 MG PO TABS: 6.2500 mg | ORAL_TABLET | Freq: Two times a day (BID) | ORAL | 3 refills | Status: DC

## 2019-12-23 MED ORDER — SPIRONOLACTONE 25 MG PO TABS: 25.0000 mg | ORAL_TABLET | Freq: Every day | ORAL | 11 refills | Status: DC

## 2019-12-23 MED ORDER — FUROSEMIDE 40 MG PO TABS: 40.0000 mg | ORAL_TABLET | Freq: Two times a day (BID) | ORAL | 11 refills | Status: DC

## 2019-12-23 MED FILL — CARVEDILOL 6.25 MG TABLET: 6.25 | 30 days supply | Qty: 60 | Fill #0

## 2019-12-23 MED FILL — SPIRONOLACTONE 25 MG TABLET: 25 | 30 days supply | Qty: 30 | Fill #0

## 2019-12-23 NOTE — Progress Notes (Signed)
PCP: Nolene Ebbs, MD Cardiology: Dr. Radford Pax HF Cardiology: Dr. Aundra Dubin  He can write his name only. He is only able to read a few words. Thinks he completed the 7th grade.    60 yo with history of chronic systolic CHF, CAD, type 2 diabetes, paroxysmal atrial fibrillation, and schizophrenia was referred by Dr. Radford Pax for evaluation of CHF.  Patient had OM2 PCI in 2012.  In 11/20, he was admitted with CHF. Echo showed EF 25-30% with diffuse hypokinesis.  LHC was done, showing occluded OM2 at prior stent, 90% D1 stenosis, and extensive diffuse RCA disease.  No intervention.  He was thought to be in paroxysmal atrial fibrillation during this appointment and apixaban was started.   He does not smoke, rarely drinks, and does not use drugs.  His mother had "heart problems."    Last week he was seen in the HF for the first time. He was restarted on spiro, lasix, and entresto.   Today he returns for HF follow up.Overall feeling fine. SOB walking. Denies PND/Orthopnea. No bleeding issues. Appetite ok. No fever or chills. Weight at home has gone down to to 315 pounds. He has been weighing every now and then.  Says he can read just a little bit. He can write his name only.  Taking all medications. Says has a hard time reading how often to take the medications. He doesn't know how to take a 1/2 tablet. Has transportation issues.  Lives with his sister.   Labs (1/21): LDL 66, HDL 42, hgb 11.3, K 4.7, creatinine 1.32  ECG (12/20): NSR, PACs, lateral TWIs concerning for ischemia.   PMH:  1. Atrial fibrillation: Paroxysmal 2. Type 2 diabetes 3. HTN 4. Hyperlipidemia 5. Schizophrenia 6. CAD: PCI OM2 in 2012.  - LHC (11/20): 90% D1 stenosis, totally occluded OM2 at stent, serial 85%/70%/60% RCA stenoses.  7. 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   - Echo (11/20): EF 25-30%, global hypokinesis.   Social History   Socioeconomic History  . Marital status: Legally  Separated    Spouse name: Not on file  . Number of children: Not on file  . Years of education: Not on file  . Highest education level: Not on file  Occupational History  . Not on file  Tobacco Use  . 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 Smokeless tobacco: Never Used  Substance and Sexual Activity  . Alcohol use: No  . Drug use: No  . Sexual activity: Yes  Other Topics Concern  . Not on file  Social History Narrative  . Not on file   Social Determinants of Health   Financial Resource Strain:   . Difficulty of Paying Living Expenses: Not on file  Food Insecurity:   . Worried About Charity fundraiser in the Last Year: Not on file  . Ran Out of Food in the Last Year: Not on file  Transportation Needs:   . Lack of Transportation (Medical): Not on file  . Lack of Transportation (Non-Medical): Not on file  Physical Activity:   . Days of Exercise per Week: Not on file  . Minutes of Exercise per Session: Not on file  Stress:   . Feeling of Stress : Not on file  Social Connections:   . Frequency of Communication with Friends and Family: Not on file  . Frequency of Social Gatherings with Friends and Family: Not on file  . Attends Religious Services: Not on file  . Active Member of Clubs or  Organizations: Not on file  . Attends Archivist Meetings: Not on file  . Marital Status: Not on file  Intimate Partner Violence:   . Fear of Current or Ex-Partner: Not on file  . Emotionally Abused: Not on file  . Physically Abused: Not on file  . Sexually Abused: Not on file   Family History  Problem Relation Age of Onset  . Heart failure Mother   . Mental illness Sister   . Mental illness Sister    ROS: All systems reviewed and negative except as per HPI.  Current Meds  Medication Sig  . apixaban (ELIQUIS) 5 MG TABS tablet Take 1 tablet (5 mg total) by mouth 2 (two) times daily.  [name] Kitchen atorvastatin (LIPITOR) 40 MG tablet Take 1 tablet (40 mg total) by mouth daily at 6 PM.  .  BLACK CURRANT SEED OIL PO Take by mouth daily. 1 tsp every day  . blood glucose meter kit and supplies KIT Dispense based on patient and insurance preference. Use up to four times daily as directed. (FOR ICD-9 250.00, 250.01).  . carvedilol (COREG) 6.25 MG tablet Take 1 tablet (6.25 mg total) by mouth 2 (two) times daily with a meal.  . furosemide (LASIX) 40 MG tablet Take 1 tablet (40 mg total) by mouth 2 (two) times daily. (Patient taking differently: Take 80 mg by mouth 2 (two) times daily. )  . gabapentin (NEURONTIN) 300 MG capsule Take 1 capsule (300 mg total) by mouth 2 (two) times daily.  . Insulin Aspart Prot & Aspart (INSULIN ASP PROT & ASP FLEXPEN) (70-30) 100 UNIT/ML SUPN Inject 15 Units into the skin 2 (two) times daily before a meal.  . Insulin Aspart Prot & Aspart (INSULIN ASP PROT & ASP FLEXPEN) (70-30) 100 UNIT/ML SUPN Inject 30 Units into the skin 2 (two) times daily.  . Insulin Pen Needle (PEN NEEDLES) 30G X 5 MM MISC 1 each by Does not apply route 2 times daily at 12 noon and 4 pm.  . potassium chloride SA (KLOR-CON) 20 MEQ tablet Take 1 tablet (20 mEq total) by mouth 2 (two) times daily.  . QUEtiapine (SEROQUEL) 100 MG tablet Take 1 tablet (100 mg total) by mouth at bedtime.  . sacubitril-valsartan (ENTRESTO) 24-26 MG Take 1 tablet by mouth 2 (two) times daily.  . sildenafil (VIAGRA) 100 MG tablet Take 1 tablet (100 mg total) by mouth daily as needed for erectile dysfunction.  [name] Kitchen spironolactone (ALDACTONE) 25 MG tablet Take 0.5 tablets (12.5 mg total) by mouth daily. (Patient taking differently: Take 25 mg by mouth daily. )  . Turmeric 500 MG CAPS Take by mouth.   BP 132/84   Pulse 93   Wt (!) 136.4 kg (300 lb 12.8 oz)   SpO2 97%   BMI 38.62 kg/m   Wt Readings from Last 3 Encounters:  12/23/19 (!) 136.4 kg (300 lb 12.8 oz)  12/16/19 (!) 149.9 kg (330 lb 6.4 oz)  12/02/19 132.9 kg (293 lb)   .med Vitals:   12/23/19 1041  BP: 132/84  Pulse: 93  SpO2: 97%    General:   Well appearing. No resp difficulty HEENT: normal Neck: supple. JVP 7-8.  Carotids 2+ bilat; no bruits. No lymphadenopathy or thryomegaly appreciated. Cor: PMI nondisplaced. Regular rate & rhythm. No rubs, gallops or murmurs. Lungs: clear Abdomen: soft, nontender, nondistended. No hepatosplenomegaly. No bruits or masses. Good bowel sounds. Extremities: no cyanosis, clubbing, rash, edema Neuro: alert & orientedx3, cranial nerves grossly intact. moves all  4 extremities w/o difficulty. Affect pleasant .   Assessment/Plan: 1. Chronic systolic CHF: Echo in 25/91 with EF 25-30%.  Suspect mixed ischemic/nonischemic cardiomyopathy.  NYHA class III .  - We need to keep medication regimen as simple as possible. He has low literacy and I am concerned about his ability to take his medication. Referred to HF Paramedicine today.  - Volume status stable. Cut back lasix to 40 mg twice a day.  -Continue  KCl 20 bid.  - Continue  Entresto 24/26 bid.  - Continue Coreg 6.25 mg bid.  - Change spiro to 25 mg daily.  - Repeat echo in the future after medical management to decide on ICD.  Narrow QRS so not CRT candidate. 2-3 months.  - Check BMET  2. CAD: Last cath in 11/20 with occluded OM2 stent, 90% D1, severe diffuse disease in the RCA (no intervention).  No chest pain.  - No ASA given apixaban use.  - Continue atorvastatin. Good lipids in 1/21.  3. Atrial fibrillation: Paroxysmal.   -Regular pulse. No bleeding issues.  - Continue apixaban 5 mg bid.   Follow up in 2 weeks. Anticipate increasing coreg at that time versus increasing entresto. HF Paramedicine referral today.   Jovita Persing NP-C  12/23/2019

## 2019-12-23 NOTE — Telephone Encounter (Signed)
I called [name] to schedule a CHP visit for tomorrow.  He didn't answer at the number provided, I left a voice mail.  I will follow up with him the morning.

## 2019-12-23 NOTE — Patient Instructions (Signed)
INCREASE Spironolactone to 25 mg, one tab daily CONTINUE Lasix 40 mg one tab twice daily  Labs today We will only contact you if something comes back abnormal or we need to make some changes. Otherwise no news is good news!  Your physician recommends that you schedule a follow-up appointment in: 2 weeks with [name] Clegg,NP  Do the following things EVERYDAY: 1) Weigh yourself in the morning before breakfast. Write it down and keep it in a log. 2) Take your medicines as prescribed 3) Eat low salt foods--Limit salt (sodium) to 2000 mg per day.  4) Stay as active as you can everyday 5) Limit all fluids for the day to less than 2 liters  6)

## 2019-12-23 NOTE — Addendum Note (Signed)
Encounter addended by: [name], LCSW on: 12/23/2019 4:21 PM  Actions taken: Clinical Note Signed

## 2019-12-23 NOTE — Progress Notes (Addendum)
Paramedicine Initial Assessment:  Housing:  In what kind of housing do you live? House/apt/trailer/shelter? apartment  Do you rent/pay a mortgage/own? rent  Do you live with anyone? sister  Are you currently worried about losing your housing? no  Within the past 12 months have you ever stayed outside, in a car, tent, a shelter, or temporarily with someone? no  Within the past 12 months have you been unable to get utilities when it was really needed? no  Social:  What is your current marital status? single  Do you have any children? None reported  Do you have family or friends who live locally? Sister who he lives with and brother and sister in law- sister in law is Ivin Booty and pt reports she is his payee  Food:  Within the past 12 months were you ever worried that food would run out before you got money to buy more? no  Within the past 37months have you run out of food and didn't have money to buy more?no  Both he and his sister get food stamps  Income:  What is your current source of income? Disability- unsure how much he gets  How hard is it for you to pay for the basics like food housing, medical care, and utilities? Not very hard   Insurance:  Are you currently insured? Yes- just has Medicare part A  Do you have prescription coverage? No  If no insurance, have you applied for coverage (Medicaid, disability, marketplace etc)? States that his sister in law is working on getting him additional coverage.  Transportation:  Do you have transportation to your medical appointments? sometimes   If yes, how? Usually uses public transit  In the past 12 months has lack of transportation kept you from medical appts or from getting medications? yes  In the past 12 months has lack of transportation kept you from meetings, work, or getting things you needed? Yes     Daily Health Needs: Do you have a working scale at home? yes  How do you manage your medications at home?  Takes them from the bottle  Do you ever take your medications differently than prescribed? Yes- pt is illiterate so is not always sure what the bottle says  Do you have issues affording your medications? Yes- is getting help through [place] but has still been an issue  If yes, has this ever prevented you from obtaining medications? Yes- currently not on seroquel because he [facility]'t afford it at [facility]- CSW had spoken to him about this and help him get GoodRx card but when he went to pick it up he couldn't afford it so hasn't been on it.  Do you have any concerns with mobility at home? Some stiffness but overall can do what he needs to do.  Do you use any assistive devices at home or have PCS at home? no   Are there any additional barriers you see to getting the care you need? No  Do you have a PCP? Pt appears to be seeing Kathe Becton through Internal Medicine  Pt also sees [facility] for mental health follow up [phone])  CSW will continue to follow through paramedicine program and assist as needed.  [name], LCSW Clinical Social Worker [facility] Desk#: [phone] Cell#: [phone]

## 2019-12-24 ENCOUNTER — Telehealth (HOSPITAL_COMMUNITY): Payer: Self-pay | Admitting: Licensed Clinical Social Worker

## 2019-12-24 ENCOUNTER — Other Ambulatory Visit (HOSPITAL_COMMUNITY): Payer: Self-pay

## 2019-12-24 MED FILL — FUROSEMIDE 40 MG TAB: 40 | 30 days supply | Qty: 60 | Fill #1

## 2019-12-24 MED FILL — NOVOLOG MIX 70-30 FLEXPEN S: (70-30) 100 | 15 days supply | Qty: 9 | Fill #1

## 2019-12-24 MED FILL — ATORVASTATIN CALCIUM 40 MG: 40 | 30 days supply | Qty: 30 | Fill #1

## 2019-12-24 MED FILL — !ELIQUIS 5MG TABLET: 5 | 30 days supply | Qty: 60 | Fill #1

## 2019-12-24 MED FILL — **ENTRESTO 24-26 MG TABLET: 24-26 | 14 days supply | Qty: 28 | Fill #1

## 2019-12-24 MED FILL — QUETIAPINE FUMARATE 100 MG: 100 | 30 days supply | Qty: 30 | Fill #1

## 2019-12-24 NOTE — Progress Notes (Unsigned)
Paramedicine Encounter    Patient ID: [name], male    DOB: 1961-07-25, 60 y.o.   MRN: [id number]    Patient Care Team: Nolene Ebbs, MD as PCP - General (Internal Medicine) [name], MD as PCP - Cardiology (Cardiology)  Patient Active Problem List   Diagnosis Date Noted  . Type 2 diabetes mellitus with hyperglycemia, with long-term current use of insulin ([place]) 12/03/2019  . Diabetic polyneuropathy associated with type 2 diabetes mellitus ([place]) 12/03/2019  . Hyperglycemia 12/03/2019  . History of hyperglycemia 12/03/2019  . Erectile dysfunction 12/03/2019  . CHF (congestive heart failure) ([place]) 10/11/2019  . Paranoid schizophrenia, chronic condition ([place]) 01/26/2015  . Severe recurrent major depressive disorder with psychotic features ([place]) 01/26/2015  . GAD (generalized anxiety disorder) 01/26/2015  . OCD (obsessive compulsive disorder) 01/26/2015  . Panic disorder without agoraphobia 01/26/2015  . Insomnia 01/26/2015    Current Outpatient Medications:  .  apixaban (ELIQUIS) 5 MG TABS tablet, Take 1 tablet (5 mg total) by mouth 2 (two) times daily., Disp: 180 tablet, Rfl: 3 .  atorvastatin (LIPITOR) 40 MG tablet, Take 1 tablet (40 mg total) by mouth daily at 6 PM., Disp: 90 tablet, Rfl: 3 .  BLACK CURRANT SEED OIL PO, Take by mouth daily. 1 tsp every day, Disp: , Rfl:  .  blood glucose meter kit and supplies KIT, Dispense based on patient and insurance preference. Use up to four times daily as directed. (FOR ICD-9 250.00, 250.01)., Disp: 1 each, Rfl: 0 .  carvedilol (COREG) 6.25 MG tablet, Take 1 tablet (6.25 mg total) by mouth 2 (two) times daily with a meal., Disp: 180 tablet, Rfl: 3 .  furosemide (LASIX) 40 MG tablet, Take 1 tablet (40 mg total) by mouth 2 (two) times daily., Disp: 60 tablet, Rfl: 11 .  gabapentin (NEURONTIN) 300 MG capsule, Take 1 capsule (300 mg total) by mouth 2 (two) times daily., Disp: 60 capsule, Rfl: 6 .  Insulin Aspart Prot & Aspart  (INSULIN ASP PROT & ASP FLEXPEN) (70-30) 100 UNIT/ML SUPN, Inject 15 Units into the skin 2 (two) times daily before a meal., Disp: 15 mL, Rfl: 0 .  Insulin Aspart Prot & Aspart (INSULIN ASP PROT & ASP FLEXPEN) (70-30) 100 UNIT/ML SUPN, Inject 30 Units into the skin 2 (two) times daily., Disp: 3 mL, Rfl: 12 .  Insulin Pen Needle (PEN NEEDLES) 30G X 5 MM MISC, 1 each by Does not apply route 2 times daily at 12 noon and 4 pm., Disp: 100 each, Rfl: 0 .  potassium chloride SA (KLOR-CON) 20 MEQ tablet, Take 1 tablet (20 mEq total) by mouth 2 (two) times daily., Disp: 60 tablet, Rfl: 6 .  QUEtiapine (SEROQUEL) 100 MG tablet, Take 1 tablet (100 mg total) by mouth at bedtime., Disp: 30 tablet, Rfl: 6 .  sacubitril-valsartan (ENTRESTO) 24-26 MG, Take 1 tablet by mouth 2 (two) times daily., Disp: 180 tablet, Rfl: 3 .  sildenafil (VIAGRA) 100 MG tablet, Take 1 tablet (100 mg total) by mouth daily as needed for erectile dysfunction., Disp: 10 tablet, Rfl: 1 .  spironolactone (ALDACTONE) 25 MG tablet, Take 1 tablet (25 mg total) by mouth daily., Disp: 30 tablet, Rfl: 11 .  Turmeric 500 MG CAPS, Take by mouth., Disp: , Rfl:  No Known Allergies   Social History   Socioeconomic History  . Marital status: Legally Separated    Spouse name: Not on file  . Number of children: Not on file  . Years  of education: Not on file  . Highest education level: Not on file  Occupational History  . Not on file  Tobacco Use  . 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 Smokeless tobacco: Never Used  Substance and Sexual Activity  . Alcohol use: No  . Drug use: No  . Sexual activity: Yes  Other Topics Concern  . Not on file  Social History Narrative  . Not on file   Social Determinants of Health   Financial Resource Strain: Low Risk   . Difficulty of Paying Living Expenses: Not very hard  Food Insecurity: No Food Insecurity  . Worried About Charity fundraiser in the Last Year: Never true  . Ran Out of Food in the Last  Year: Never true  Transportation Needs: Unmet Transportation Needs  . Lack of Transportation (Medical): Yes  . Lack of Transportation (Non-Medical): Yes  Physical Activity:   . Days of Exercise per Week: Not on file  . Minutes of Exercise per Session: Not on file  Stress:   . Feeling of Stress : Not on file  Social Connections:   . Frequency of Communication with Friends and Family: Not on file  . Frequency of Social Gatherings with Friends and Family: Not on file  . Attends Religious Services: Not on file  . Active Member of Clubs or Organizations: Not on file  . Attends Archivist Meetings: Not on file  . Marital Status: Not on file  Intimate Partner Violence:   . Fear of Current or Ex-Partner: Not on file  . Emotionally Abused: Not on file  . Physically Abused: Not on file  . Sexually Abused: Not on file    Physical Exam Pulmonary:     Effort: Pulmonary effort is normal. No respiratory distress.     Breath sounds: No wheezing.  Musculoskeletal:     Right lower leg: Edema present.     Left lower leg: Edema present.  Skin:    General: Skin is warm and dry.         Future Appointments  Date Time Provider [place]  01/06/2020  1:30 PM [facility] PA/NP [facility] None  02/03/2020  4:00 PM [name], MD [facility]  03/01/2020  1:20 PM Azzie Glatter, FNP [facility] None     There were no vitals taken for this visit. Initial visit  ATF pt CAO x4 sitting in the living room with no complaints. Today is his initial CHP visit.  We began the visit talking about the food that he eats and the reason for the recent weight gain. He gained about 37 lbs from 12/02/19-12/16/19.  He said that he can feel a difference in his breathing and he's not as tired. He doesn't have any issues sleeping (especially after I take Seroquel).He denies sob, chest pain and dizziness today.  I picked up his medications that were ready from [place] before this visit. He's running  out of several medications too early.  I called [facility] for refill on Furosemide; the pharmacist said that he had just gotten a refill on 12/16/19 for 60 tabs.  He will be out of furosemide Sunday evening.  He told me that he was told to take 4 pills in the morning and 4 at night for a few days; then two in the morning and two at night. I haven't been able to locate any documentation reflecting those orders.  He's also running out of  Quetiapine too early; about a week too soon  according to the pharmacist.  I spoke with Tammy Sours L[facility] at [facility] about his medications.  She will help with getting him enough medications to last until his next refill. I fixed the pill box according to the med list in Epic.  I explained to him how to use the pill box.  He agrees to weigh daily and to record his weight. He voiced that he understands to call [name] or the Heart failure clinic if he has a 3lb weight gain over night or 5lbs over a week.    During this visit, I helped him complete an application for [employer] transportation. I also Activated his food stamp card.  I told him that I will f/u with him about his medications. He said that he doesn't have anyone to put down for emergency contact.  I told him that Nira Conn williams will be seeing after this week.    Medication ordered (needed this week)  eliquis filled until tues morning Eliezer Lofts has already completed a [employer]vartis application)  Atorvastatin Completely out Furosemide filled until sun night Gabapentin Quetiapine filled until thurs entresto filled until Monday  [place], EMT Paramedic [phone] 12/24/2019    ACTION: Home visit completed

## 2019-12-24 NOTE — Telephone Encounter (Signed)
CSW called [facility] pharmacy to confirm pt meds are ready and inquire about cost- they confirmed pt carvedilol and spiro are ready and that they would be $8- CSW inquired if seroquel script had come in from pt psychiatrist but they stated they already had pt seroquel on file there and it had been filled 1/5 so would not be ready to fill again until next week.  CSW updated [employer] who will pick up meds for patient prior to visit.  CSW will continue to follow and assist as needed  [name], [facility] Desk#: [phone] Cell#: [phone]

## 2019-12-24 NOTE — Telephone Encounter (Signed)
CSW received call from [employer] following her visit- patient has been taking mediations incorrectly and is out of almost out of lasix, seroquel, entresto, atorvastatin, and eliquis.   CSW called [facility] and they are able to fill all of those medications for him- paramedic to pick up tomorrow and revisit patient to fix pill box.  CSW will continue to follow and assist as needed  [name], [facility] Desk#: [phone] Cell#: [phone]

## 2019-12-31 ENCOUNTER — Telehealth (HOSPITAL_COMMUNITY): Payer: Self-pay | Admitting: Pharmacy Technician

## 2019-12-31 ENCOUNTER — Telehealth (HOSPITAL_COMMUNITY): Payer: Self-pay | Admitting: Licensed Clinical Social Worker

## 2019-12-31 MED FILL — SILDENAFIL CITRATE 100 MG T: 100 | 30 days supply | Qty: 5 | Fill #0

## 2019-12-31 NOTE — Telephone Encounter (Signed)
CSW called pt sister in law, [name], to discuss pt current insurance status.  Pt has reported [name] is his payee and is helping him to apply for Medicare part B and D.  CSW unable to reach- left message  CSW also called [employer] to check on the status of patients application- informed that he was approved to receive Entresto from 12/24/19- 12/23/20- pt will need to call the [employer] pharmacy in order to set up first delivery- CSW informed pt of approval and need to call in to order first shipment- texted patient then number to do so.  When speaking to pt about [employer] assistance he also mentioned that his sister-in-law is not being a good payee for him and he is planning on switching to his niece, [name].  They have an appt with [employer] to do this- will let CSW know of Brittany's contact info- request that his sister in law be taken off his contact list.  CSW will continue to follow and assist as needed  [name], [facility] Desk#: [phone] Cell#: [phone]

## 2019-12-31 NOTE — Telephone Encounter (Signed)
[facility] Patient Advocate Encounter   Patient was approved to receive Entresto from [employer].  Patient ID: [id number] Effective dates: 12/24/19 through 12/23/20  Called and informed patient of approval. Provided him with [employer] number, [phone].  Charlann Boxer, CPhT

## 2020-01-01 ENCOUNTER — Other Ambulatory Visit (HOSPITAL_COMMUNITY): Payer: Self-pay

## 2020-01-01 ENCOUNTER — Telehealth (HOSPITAL_COMMUNITY): Payer: Self-pay | Admitting: Licensed Clinical Social Worker

## 2020-01-01 MED FILL — GABAPENTIN 300 MG CAPSULE: 300 | 30 days supply | Qty: 60 | Fill #1

## 2020-01-01 NOTE — Progress Notes (Signed)
Paramedicine Encounter    Patient ID: [name], male    DOB: August 31, 1961, 59 y.o.   MRN: [id number]   Patient Care Team: Nolene Ebbs, MD as PCP - General (Internal Medicine) [name], MD as PCP - Cardiology (Cardiology)  Patient Active Problem List   Diagnosis Date Noted  . Type 2 diabetes mellitus with hyperglycemia, with long-term current use of insulin ([place]) 12/03/2019  . Diabetic polyneuropathy associated with type 2 diabetes mellitus ([place]) 12/03/2019  . Hyperglycemia 12/03/2019  . History of hyperglycemia 12/03/2019  . Erectile dysfunction 12/03/2019  . CHF (congestive heart failure) ([place]) 10/11/2019  . Paranoid schizophrenia, chronic condition ([place]) 01/26/2015  . Severe recurrent major depressive disorder with psychotic features ([place]) 01/26/2015  . GAD (generalized anxiety disorder) 01/26/2015  . OCD (obsessive compulsive disorder) 01/26/2015  . Panic disorder without agoraphobia 01/26/2015  . Insomnia 01/26/2015    Current Outpatient Medications:  .  BLACK CURRANT SEED OIL PO, Take by mouth daily. 1 tsp every day, Disp: , Rfl:  .  blood glucose meter kit and supplies KIT, Dispense based on patient and insurance preference. Use up to four times daily as directed. (FOR ICD-9 250.00, 250.01)., Disp: 1 each, Rfl: 0 .  carvedilol (COREG) 6.25 MG tablet, Take 1 tablet (6.25 mg total) by mouth 2 (two) times daily with a meal., Disp: 180 tablet, Rfl: 3 .  furosemide (LASIX) 40 MG tablet, Take 1 tablet (40 mg total) by mouth 2 (two) times daily., Disp: 60 tablet, Rfl: 11 .  gabapentin (NEURONTIN) 300 MG capsule, Take 1 capsule (300 mg total) by mouth 2 (two) times daily., Disp: 60 capsule, Rfl: 6 .  Insulin Aspart Prot & Aspart (INSULIN ASP PROT & ASP FLEXPEN) (70-30) 100 UNIT/ML SUPN, Inject 30 Units into the skin 2 (two) times daily., Disp: 3 mL, Rfl: 12 .  Insulin Pen Needle (PEN NEEDLES) 30G X 5 MM MISC, 1 each by Does not apply route 2 times daily at 12 noon and  4 pm., Disp: 100 each, Rfl: 0 .  potassium chloride SA (KLOR-CON) 20 MEQ tablet, Take 1 tablet (20 mEq total) by mouth 2 (two) times daily., Disp: 60 tablet, Rfl: 6 .  QUEtiapine (SEROQUEL) 100 MG tablet, Take 1 tablet (100 mg total) by mouth at bedtime., Disp: 30 tablet, Rfl: 6 .  sacubitril-valsartan (ENTRESTO) 24-26 MG, Take 1 tablet by mouth 2 (two) times daily., Disp: 180 tablet, Rfl: 3 .  sildenafil (VIAGRA) 100 MG tablet, Take 1 tablet (100 mg total) by mouth daily as needed for erectile dysfunction., Disp: 10 tablet, Rfl: 1 .  spironolactone (ALDACTONE) 25 MG tablet, Take 1 tablet (25 mg total) by mouth daily. (Patient taking differently: Take 25 mg by mouth at bedtime. ), Disp: 30 tablet, Rfl: 11 .  Turmeric 500 MG CAPS, Take by mouth., Disp: , Rfl:  .  apixaban (ELIQUIS) 5 MG TABS tablet, Take 1 tablet (5 mg total) by mouth 2 (two) times daily., Disp: 180 tablet, Rfl: 3 .  atorvastatin (LIPITOR) 40 MG tablet, Take 1 tablet (40 mg total) by mouth daily at 6 PM., Disp: 90 tablet, Rfl: 3 .  Insulin Aspart Prot & Aspart (INSULIN ASP PROT & ASP FLEXPEN) (70-30) 100 UNIT/ML SUPN, Inject 15 Units into the skin 2 (two) times daily before a meal. (Patient not taking: Reported on 01/01/2020), Disp: 15 mL, Rfl: 0 No Known Allergies    Social History   Socioeconomic History  . Marital status: Legally Separated  Spouse name: Not on file  . Number of children: Not on file  . Years of education: Not on file  . Highest education level: Not on file  Occupational History  . Not on file  Tobacco Use  . Smoking status: Former Smoker  . Smokeless tobacco: Never Used  Substance and Sexual Activity  . Alcohol use: No  . Drug use: No  . Sexual activity: Yes  Other Topics Concern  . Not on file  Social History Narrative  . Not on file   Social Determinants of Health   Financial Resource Strain: Low Risk   . Difficulty of Paying Living Expenses: Not very hard  Food Insecurity: No Food  Insecurity  . Worried About Running Out of Food in the Last Year: Never true  . Ran Out of Food in the Last Year: Never true  Transportation Needs: Unmet Transportation Needs  . Lack of Transportation (Medical): Yes  . Lack of Transportation (Non-Medical): Yes  Physical Activity:   . Days of Exercise per Week: Not on file  . Minutes of Exercise per Session: Not on file  Stress:   . Feeling of Stress : Not on file  Social Connections:   . Frequency of Communication with Friends and Family: Not on file  . Frequency of Social Gatherings with Friends and Family: Not on file  . Attends Religious Services: Not on file  . Active Member of Clubs or Organizations: Not on file  . Attends Club or Organization Meetings: Not on file  . Marital Status: Not on file  Intimate Partner Violence:   . Fear of Current or Ex-Partner: Not on file  . Emotionally Abused: Not on file  . Physically Abused: Not on file  . Sexually Abused: Not on file    Physical Exam      Future Appointments  Date Time Provider Department Center  01/06/2020  1:30 PM [facility] PA/NP [facility] None  02/03/2020  4:00 PM Turner, Traci R, MD [facility]  03/01/2020  1:20 PM Stroud, Natalie M, FNP [facility] None    BP 118/82   Pulse 92   Temp (!) 97.3 F (36.3 C)   Resp 18   Wt 291 lb (132 kg)   SpO2 98%   BMI 37.36 kg/m  CBG EMS-243 Checked with his glucometer-246 Weight yesterday-290 Last visit weight-296  Pt reports he is doing good. His weight is down 5lbs from last week. He states his breathing is doing pretty good.  He denies c/p, no dizziness. He states he is cutting back on his food intake and trying to do more walking around.  No missed doses in meds this week.  He did call entresto to sch delivery yesterday.  He states the gabapentin is not strong enough- he has to take tylenol as well.  He ran out of his gabapentin-he is 2 doses short. Will call it in to pharmacy for refill.  meds verified and  pill box refilled.  Potassium too soon to refill.  Pt took his morn meds during our visit.     , EMT-Paramedic [phone] [facility]  01/01/20   

## 2020-01-01 NOTE — Telephone Encounter (Signed)
Community paramedic brought CSW completed patient portion of SCAT application.  CSW completed the provider portion and sent to SCAT for review  CSW will continue to follow and assist as needed  [name], [place] Worker [facility] Desk#: [phone] Cell#: [phone]

## 2020-01-06 ENCOUNTER — Other Ambulatory Visit (HOSPITAL_COMMUNITY): Payer: Self-pay

## 2020-01-06 ENCOUNTER — Ambulatory Visit (HOSPITAL_COMMUNITY)
Admission: RE | Admit: 2020-01-06 | Discharge: 2020-01-06 | Disposition: A | Discharge status: Home / Self Care | Payer: Medicare Other | Source: Ambulatory Visit | Attending: Cardiology | Admitting: Cardiology

## 2020-01-06 ENCOUNTER — Telehealth (HOSPITAL_COMMUNITY): Payer: Self-pay

## 2020-01-06 ENCOUNTER — Other Ambulatory Visit: Payer: Self-pay

## 2020-01-06 ENCOUNTER — Encounter (HOSPITAL_COMMUNITY): Payer: Self-pay

## 2020-01-06 VITALS — BP 168/96 | HR 76 | Wt 296.0 lb

## 2020-01-06 DIAGNOSIS — Z87891 Personal history of nicotine dependence: Secondary | ICD-10-CM | POA: Insufficient documentation

## 2020-01-06 DIAGNOSIS — I2510 Atherosclerotic heart disease of native coronary artery without angina pectoris: Secondary | ICD-10-CM | POA: Diagnosis not present

## 2020-01-06 DIAGNOSIS — I509 Heart failure, unspecified: Secondary | ICD-10-CM | POA: Diagnosis not present

## 2020-01-06 DIAGNOSIS — E119 Type 2 diabetes mellitus without complications: Secondary | ICD-10-CM | POA: Diagnosis not present

## 2020-01-06 DIAGNOSIS — Z794 Long term (current) use of insulin: Secondary | ICD-10-CM | POA: Diagnosis not present

## 2020-01-06 DIAGNOSIS — Z955 Presence of coronary angioplasty implant and graft: Secondary | ICD-10-CM | POA: Diagnosis not present

## 2020-01-06 DIAGNOSIS — I110 Hypertensive heart disease with heart failure: Secondary | ICD-10-CM | POA: Insufficient documentation

## 2020-01-06 DIAGNOSIS — Z79899 Other long term (current) drug therapy: Secondary | ICD-10-CM | POA: Insufficient documentation

## 2020-01-06 DIAGNOSIS — E785 Hyperlipidemia, unspecified: Secondary | ICD-10-CM | POA: Insufficient documentation

## 2020-01-06 DIAGNOSIS — F209 Schizophrenia, unspecified: Secondary | ICD-10-CM | POA: Insufficient documentation

## 2020-01-06 DIAGNOSIS — G4733 Obstructive sleep apnea (adult) (pediatric): Secondary | ICD-10-CM

## 2020-01-06 DIAGNOSIS — I5022 Chronic systolic (congestive) heart failure: Secondary | ICD-10-CM

## 2020-01-06 DIAGNOSIS — I10 Essential (primary) hypertension: Secondary | ICD-10-CM | POA: Diagnosis not present

## 2020-01-06 DIAGNOSIS — Z7901 Long term (current) use of anticoagulants: Secondary | ICD-10-CM | POA: Diagnosis not present

## 2020-01-06 DIAGNOSIS — I480 Paroxysmal atrial fibrillation: Secondary | ICD-10-CM | POA: Diagnosis not present

## 2020-01-06 DIAGNOSIS — Z9989 Dependence on other enabling machines and devices: Secondary | ICD-10-CM

## 2020-01-06 LAB — BASIC METABOLIC PANEL WITH GFR
Anion gap: 9 (ref 5–15)
BUN: 24 mg/dL — ABNORMAL HIGH (ref 6–20)
CO2: 27 mmol/L (ref 22–32)
Calcium: 8.9 mg/dL (ref 8.9–10.3)
Chloride: 104 mmol/L (ref 98–111)
Creatinine, Ser: 1.48 mg/dL — ABNORMAL HIGH (ref 0.61–1.24)
GFR calc Af Amer: 59 mL/min — ABNORMAL LOW
GFR calc non Af Amer: 51 mL/min — ABNORMAL LOW
Glucose, Bld: 136 mg/dL — ABNORMAL HIGH (ref 70–99)
Potassium: 4.5 mmol/L (ref 3.5–5.1)
Sodium: 140 mmol/L (ref 135–145)

## 2020-01-06 MED ORDER — SACUBITRIL-VALSARTAN 49-51 MG PO TABS: 1.0000 | ORAL_TABLET | Freq: Two times a day (BID) | ORAL | 6 refills | Status: DC

## 2020-01-06 NOTE — Telephone Encounter (Signed)
Spoke to [place] who agreed to visit Thursday at 0900.

## 2020-01-06 NOTE — Progress Notes (Signed)
PCP: Nolene Ebbs, MD Cardiology: Dr. Radford Pax HF Cardiology: Dr. Aundra Dubin  He can write his name only. He is only able to read a few words. Thinks he completed the 7th grade.    59 yo with history of chronic systolic CHF, CAD, type 2 diabetes, paroxysmal atrial fibrillation, and schizophrenia was referred by Dr. Radford Pax for evaluation of CHF.  Patient had OM2 PCI in 2012.  In 11/20, he was admitted with CHF. Echo showed EF 25-30% with diffuse hypokinesis.  LHC was done, showing occluded OM2 at prior stent, 90% D1 stenosis, and extensive diffuse RCA disease.  No intervention.  He was thought to be in paroxysmal atrial fibrillation during this appointment and apixaban was started.   He does not smoke, rarely drinks, and does not use drugs.  His mother had "heart problems."    Today he returns for HF follow up.Last visit he was referred to HF Paramedicine to help with medication management. He had numerous medication errors because he cant read. Overall feeling fine. Mild SOB with steps. Denies PND/Orthopnea. Appetite ok. No fever or chills. Weight at home 296-297 pounds. Uses CPAP 4 nights a week. Taking all medications. All medications set up by HF Paramedicine. Says he last worked 7-8 years ago. He has been disabled.    Social History --->Says he can read just a little bit. He can write his name only. Says has a hard time reading medication labels. He doesn't know how to take a 1/2 tablet. Has transportation issues.  Lives with his sister.   Labs (1/21): LDL 66, HDL 42, hgb 11.3, K 4.7, creatinine 1.32  ECG (12/20): NSR, PACs, lateral TWIs concerning for ischemia.   PMH:  1. Atrial fibrillation: Paroxysmal 2. Type 2 diabetes 3. HTN 4. Hyperlipidemia 5. Schizophrenia 6. CAD: PCI OM2 in 2012.  - LHC (11/20): 90% D1 stenosis, totally occluded OM2 at stent, serial 85%/70%/60% RCA stenoses.  7. 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   - Echo (11/20): EF 25-30%,  global hypokinesis.   Social History   Socioeconomic History  . Marital status: Legally Separated    Spouse name: Not on file  . Number of children: Not on file  . Years of education: Not on file  . Highest education level: Not on file  Occupational History  . Not on file  Tobacco Use  . 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 Smokeless tobacco: Never Used  Substance and Sexual Activity  . Alcohol use: No  . Drug use: No  . Sexual activity: Yes  Other Topics Concern  . Not on file  Social History Narrative  . Not on file   Social Determinants of Health   Financial Resource Strain: Low Risk   . Difficulty of Paying Living Expenses: Not very hard  Food Insecurity: No Food Insecurity  . Worried About Charity fundraiser in the Last Year: Never true  . Ran Out of Food in the Last Year: Never true  Transportation Needs: Unmet Transportation Needs  . Lack of Transportation (Medical): Yes  . Lack of Transportation (Non-Medical): Yes  Physical Activity:   . Days of Exercise per Week: Not on file  . Minutes of Exercise per Session: Not on file  Stress:   . Feeling of Stress : Not on file  Social Connections:   . Frequency of Communication with Friends and Family: Not on file  . Frequency of Social Gatherings with Friends and Family: Not on file  . Attends Religious Services: Not  on file  . Active Member of Clubs or Organizations: Not on file  . Attends Archivist Meetings: Not on file  . Marital Status: Not on file  Intimate Partner Violence:   . Fear of Current or Ex-Partner: Not on file  . Emotionally Abused: Not on file  . Physically Abused: Not on file  . Sexually Abused: Not on file   Family History  Problem Relation Age of Onset  . Heart failure Mother   . Mental illness Sister   . Mental illness Sister    ROS: All systems reviewed and negative except as per HPI.  Current Meds  Medication Sig  . apixaban (ELIQUIS) 5 MG TABS tablet Take 1 tablet (5 mg  total) by mouth 2 (two) times daily.  [name] Kitchen atorvastatin (LIPITOR) 40 MG tablet Take 1 tablet (40 mg total) by mouth daily at 6 PM.  . BLACK CURRANT SEED OIL PO Take by mouth daily. 1 tsp every day  . blood glucose meter kit and supplies KIT Dispense based on patient and insurance preference. Use up to four times daily as directed. (FOR ICD-9 250.00, 250.01).  . carvedilol (COREG) 6.25 MG tablet Take 1 tablet (6.25 mg total) by mouth 2 (two) times daily with a meal.  . furosemide (LASIX) 40 MG tablet Take 1 tablet (40 mg total) by mouth 2 (two) times daily.  [name] Kitchen gabapentin (NEURONTIN) 300 MG capsule Take 1 capsule (300 mg total) by mouth 2 (two) times daily.  . Insulin Aspart Prot & Aspart (INSULIN ASP PROT & ASP FLEXPEN) (70-30) 100 UNIT/ML SUPN Inject 15 Units into the skin 2 (two) times daily before a meal.  . Insulin Aspart Prot & Aspart (INSULIN ASP PROT & ASP FLEXPEN) (70-30) 100 UNIT/ML SUPN Inject 30 Units into the skin 2 (two) times daily.  . Insulin Pen Needle (PEN NEEDLES) 30G X 5 MM MISC 1 each by Does not apply route 2 times daily at 12 noon and 4 pm.  . potassium chloride SA (KLOR-CON) 20 MEQ tablet Take 1 tablet (20 mEq total) by mouth 2 (two) times daily.  . QUEtiapine (SEROQUEL) 100 MG tablet Take 1 tablet (100 mg total) by mouth at bedtime.  . sacubitril-valsartan (ENTRESTO) 24-26 MG Take 1 tablet by mouth 2 (two) times daily.  [name] Kitchen spironolactone (ALDACTONE) 25 MG tablet Take 1 tablet (25 mg total) by mouth daily. (Patient taking differently: Take 25 mg by mouth at bedtime. )  . Turmeric 500 MG CAPS Take by mouth.   BP (!) 168/96   Pulse 76   Wt 134.3 kg (296 lb)   SpO2 98%   BMI 38.00 kg/m   Wt Readings from Last 3 Encounters:  01/06/20 134.3 kg (296 lb)  01/01/20 132 kg (291 lb)  12/24/19 134.5 kg (296 lb 9.6 oz)   .med Vitals:   01/06/20 1330  BP: (!) 168/96  Pulse: 76  SpO2: 98%   General:  Well appearing. No resp difficulty HEENT: normal Neck: supple. no JVD.  Carotids 2+ bilat; no bruits. No lymphadenopathy or thryomegaly appreciated. Cor: PMI nondisplaced. Regular rate & rhythm. No rubs, gallops or murmurs. Lungs: clear Abdomen: soft, nontender, nondistended. No hepatosplenomegaly. No bruits or masses. Good bowel sounds. Extremities: no cyanosis, clubbing, rash, edema Neuro: alert & orientedx3, cranial nerves grossly intact. moves all 4 extremities w/o difficulty. Affect pleasant  Assessment/Plan: 1. Chronic systolic CHF: Echo in 03/50 with EF 25-30%.  Suspect mixed ischemic/nonischemic cardiomyopathy.   - We need to keep medication  regimen as simple as possible. He has low literacy and was referred to HF Paramedicine.    NYHA III. Volume status stable. Continue lasix to 40 mg twice a day.  -Continue  KCl 20 bid.  -Increase entresto  49-51 mg twice a day.  - Continue Coreg 6.25 mg bid.  - Change spiro to 25 mg daily.  - No SGLTi with A1C 10.4. - Check BMET today.  - Repeat echo in the future after medical management to decide on ICD.  Narrow QRS so not CRT candidate. 2-3 months.   2. CAD: Last cath in 11/20 with occluded OM2 stent, 90% D1, severe diffuse disease in the RCA (no intervention).  No chest pain.  - No ASA given apixaban use.  - Continue atorvastatin. Good lipids in 1/21.   3. Atrial fibrillation: Paroxysmal.   - Regular pulse. No bleeding issues.  - Continue apixaban 5 mg bid.   4. DMII -On insulin.  - Last Hgb A1C 10.4  Needs follow up with PCP.   5. OSA Continue CPAP. Reinforced nightly use.   6. HTN Uncontrolled. Increasing entesto as noted above.   Follow up in 2 weeks. Check BMET at that time. HF Paramedicine helping tremendously with medications. Needs follow up with PCP for uncontrolled diabetes.    Taylah Dubiel NP-C  01/06/2020

## 2020-01-06 NOTE — Progress Notes (Signed)
Paramedicine Encounter   Patient ID: [name] , male,   DOB: December 09, 1960,58 y.o.,  MRN: [id number]   Met patient in clinic today with provider.   Weight @ clinic-296 B/p-168/96 p-76 sp02-98  B/p elevated--amy is increasing his entresto to 49/51.  No bleeding issues.  His CBG's elevated.  He needs f/u ASAP with PCP on his CBG.  Labs done today.  He is moving soon. He will let us know ahead of time.  sent note to PCP regarding his CBG.   Marylouise Stacks, [place] 01/06/2020

## 2020-01-06 NOTE — Patient Instructions (Signed)
Lab work done today. We will notify you of any abnormal lab work. No news is good news!  INCREASE Entresto to 49/51mg  dose. One tab two times daily.  Please follow up with the [facility] in 2 weeks.   At the [facility], you and your health needs are our priority. As part of our continuing mission to provide you with exceptional heart care, we have created designated Provider Care Teams. These Care Teams include your primary Cardiologist (physician) and Advanced Practice Providers (APPs- Physician Assistants and Nurse Practitioners) who all work together to provide you with the care you need, when you need it.   You may see any of the following providers on your designated Care Team at your next follow up: [name] Kitchen Dr Glori Bickers . Dr Loralie Champagne . Darrick Grinder, NP . Lyda Jester, PA . [name], PharmD   Please be sure to bring in all your medications bottles to every appointment.

## 2020-01-08 ENCOUNTER — Other Ambulatory Visit (HOSPITAL_COMMUNITY): Payer: Self-pay

## 2020-01-08 NOTE — Progress Notes (Signed)
Paramedicine Encounter    Patient ID: [name], male    DOB: 01/17/1961, 59 y.o.   MRN: [id number]   Patient Care Team: Nolene Ebbs, MD as PCP - General (Internal Medicine) [name], MD as PCP - Cardiology (Cardiology)  Patient Active Problem List   Diagnosis Date Noted  . Type 2 diabetes mellitus with hyperglycemia, with long-term current use of insulin ([place]) 12/03/2019  . Diabetic polyneuropathy associated with type 2 diabetes mellitus ([place]) 12/03/2019  . Hyperglycemia 12/03/2019  . History of hyperglycemia 12/03/2019  . Erectile dysfunction 12/03/2019  . CHF (congestive heart failure) ([place]) 10/11/2019  . Paranoid schizophrenia, chronic condition ([place]) 01/26/2015  . Severe recurrent major depressive disorder with psychotic features ([place]) 01/26/2015  . GAD (generalized anxiety disorder) 01/26/2015  . OCD (obsessive compulsive disorder) 01/26/2015  . Panic disorder without agoraphobia 01/26/2015  . Insomnia 01/26/2015    Current Outpatient Medications:  .  apixaban (ELIQUIS) 5 MG TABS tablet, Take 1 tablet (5 mg total) by mouth 2 (two) times daily., Disp: 180 tablet, Rfl: 3 .  atorvastatin (LIPITOR) 40 MG tablet, Take 1 tablet (40 mg total) by mouth daily at 6 PM., Disp: 90 tablet, Rfl: 3 .  blood glucose meter kit and supplies KIT, Dispense based on patient and insurance preference. Use up to four times daily as directed. (FOR ICD-9 250.00, 250.01)., Disp: 1 each, Rfl: 0 .  carvedilol (COREG) 6.25 MG tablet, Take 1 tablet (6.25 mg total) by mouth 2 (two) times daily with a meal., Disp: 180 tablet, Rfl: 3 .  furosemide (LASIX) 40 MG tablet, Take 1 tablet (40 mg total) by mouth 2 (two) times daily., Disp: 60 tablet, Rfl: 11 .  gabapentin (NEURONTIN) 300 MG capsule, Take 1 capsule (300 mg total) by mouth 2 (two) times daily., Disp: 60 capsule, Rfl: 6 .  Insulin Aspart Prot & Aspart (INSULIN ASP PROT & ASP FLEXPEN) (70-30) 100 UNIT/ML SUPN, Inject 15 Units into the  skin 2 (two) times daily before a meal., Disp: 15 mL, Rfl: 0 .  Insulin Aspart Prot & Aspart (INSULIN ASP PROT & ASP FLEXPEN) (70-30) 100 UNIT/ML SUPN, Inject 30 Units into the skin 2 (two) times daily., Disp: 3 mL, Rfl: 12 .  Insulin Pen Needle (PEN NEEDLES) 30G X 5 MM MISC, 1 each by Does not apply route 2 times daily at 12 noon and 4 pm., Disp: 100 each, Rfl: 0 .  potassium chloride SA (KLOR-CON) 20 MEQ tablet, Take 1 tablet (20 mEq total) by mouth 2 (two) times daily., Disp: 60 tablet, Rfl: 6 .  QUEtiapine (SEROQUEL) 100 MG tablet, Take 1 tablet (100 mg total) by mouth at bedtime., Disp: 30 tablet, Rfl: 6 .  spironolactone (ALDACTONE) 25 MG tablet, Take 1 tablet (25 mg total) by mouth daily. (Patient taking differently: Take 25 mg by mouth at bedtime. ), Disp: 30 tablet, Rfl: 11 .  BLACK CURRANT SEED OIL PO, Take by mouth daily. 1 tsp every day, Disp: , Rfl:  .  sacubitril-valsartan (ENTRESTO) 49-51 MG, Take 1 tablet by mouth 2 (two) times daily., Disp: 60 tablet, Rfl: 6 .  sildenafil (VIAGRA) 100 MG tablet, Take 1 tablet (100 mg total) by mouth daily as needed for erectile dysfunction. (Patient not taking: Reported on 01/06/2020), Disp: 10 tablet, Rfl: 1 .  Turmeric 500 MG CAPS, Take by mouth., Disp: , Rfl:  No Known Allergies   Social History   Socioeconomic History  . Marital status: Legally Separated  Spouse name: Not on file  . Number of children: Not on file  . Years of education: Not on file  . Highest education level: Not on file  Occupational History  . Not on file  Tobacco Use  . 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 Smokeless tobacco: Never Used  Substance and Sexual Activity  . Alcohol use: No  . Drug use: No  . Sexual activity: Yes  Other Topics Concern  . Not on file  Social History Narrative  . Not on file   Social Determinants of Health   Financial Resource Strain: Low Risk   . Difficulty of Paying Living Expenses: Not very hard  Food Insecurity: No Food  Insecurity  . Worried About Charity fundraiser in the Last Year: Never true  . Ran Out of Food in the Last Year: Never true  Transportation Needs: Unmet Transportation Needs  . Lack of Transportation (Medical): Yes  . Lack of Transportation (Non-Medical): Yes  Physical Activity:   . Days of Exercise per Week: Not on file  . Minutes of Exercise per Session: Not on file  Stress:   . Feeling of Stress : Not on file  Social Connections:   . Frequency of Communication with Friends and Family: Not on file  . Frequency of Social Gatherings with Friends and Family: Not on file  . Attends Religious Services: Not on file  . Active Member of Clubs or Organizations: Not on file  . Attends Archivist Meetings: Not on file  . Marital Status: Not on file  Intimate Partner Violence:   . Fear of Current or Ex-Partner: Not on file  . Emotionally Abused: Not on file  . Physically Abused: Not on file  . Sexually Abused: Not on file    Physical Exam Vitals reviewed.  HENT:     Head: Normocephalic.     Nose: Nose normal.     Mouth/Throat:     Mouth: Mucous membranes are moist.  Eyes:     Pupils: Pupils are equal, round, and reactive to light.  Cardiovascular:     Rate and Rhythm: Normal rate and regular rhythm.  Pulmonary:     Effort: Pulmonary effort is normal.     Breath sounds: Normal breath sounds.  Abdominal:     Palpations: Abdomen is soft.  Musculoskeletal:        General: Normal range of motion.     Cervical back: Normal range of motion.     Right lower leg: No edema.     Left lower leg: No edema.  Skin:    General: Skin is warm and dry.     Capillary Refill: Capillary refill takes less than 2 seconds.  Neurological:     Mental Status: He is alert. Mental status is at baseline.  Psychiatric:        Mood and Affect: Mood normal.    Arrived for home visit for [name] who was alert and oriented with no present complaints and states he feels okay this morning. [name]  reports no increased difficulty breathing or issues walking, sleeping or completing daily activities. Vitals were obtained. Patient's weight increased 3 pounds since last visit. Medications were reviwed and confirmed. Patient stated he has medications at [facility] pharmacy that he has not yet picked up including his new dose of Entresto. I informed patient I will go and pick same up and place in pill box. Patient agreed. Pill box filled accordingly with previous dose of Entresto until  new prescription is picked up. Patient also noted to be out of Gabapentin, he states this is not helping with his neuropathy. I advised I would send message to PCP. Patient agreed to Thursday weekly visits for now and will adjust as needed. Patient signed HIPPA agreement and home visit completed. Will follow up with patient about medications today. Home visit completed.    Refills: Gabapentin Novolog Potassium  Entresto  CBG- 232  Weight today- 299lbs Weight last visit- 296lbs   Future Appointments  Date Time Provider [place]  01/22/2020  3:00 PM [facility] PA/NP [facility] None  02/03/2020  4:00 PM [name], MD [facility]  03/01/2020  1:20 PM Azzie Glatter, FNP [facility] None     ACTION: Home visit completed Next visit planned for one week

## 2020-01-12 ENCOUNTER — Other Ambulatory Visit: Payer: Self-pay

## 2020-01-12 ENCOUNTER — Ambulatory Visit (INDEPENDENT_AMBULATORY_CARE_PROVIDER_SITE_OTHER): Payer: Medicare Other | Admitting: Family Medicine

## 2020-01-12 ENCOUNTER — Encounter: Payer: Self-pay | Admitting: Family Medicine

## 2020-01-12 VITALS — BP 105/62 | HR 85 | Temp 98.4°F | Ht 74.0 in | Wt 297.4 lb

## 2020-01-12 DIAGNOSIS — Z794 Long term (current) use of insulin: Secondary | ICD-10-CM

## 2020-01-12 DIAGNOSIS — R7309 Other abnormal glucose: Secondary | ICD-10-CM | POA: Diagnosis not present

## 2020-01-12 DIAGNOSIS — E1142 Type 2 diabetes mellitus with diabetic polyneuropathy: Secondary | ICD-10-CM | POA: Diagnosis not present

## 2020-01-12 DIAGNOSIS — E1165 Type 2 diabetes mellitus with hyperglycemia: Secondary | ICD-10-CM | POA: Diagnosis not present

## 2020-01-12 DIAGNOSIS — R739 Hyperglycemia, unspecified: Secondary | ICD-10-CM

## 2020-01-12 DIAGNOSIS — E119 Type 2 diabetes mellitus without complications: Secondary | ICD-10-CM

## 2020-01-12 DIAGNOSIS — F411 Generalized anxiety disorder: Secondary | ICD-10-CM

## 2020-01-12 DIAGNOSIS — R8290 Unspecified abnormal findings in urine: Secondary | ICD-10-CM

## 2020-01-12 DIAGNOSIS — Z09 Encounter for follow-up examination after completed treatment for conditions other than malignant neoplasm: Secondary | ICD-10-CM

## 2020-01-12 DIAGNOSIS — M62838 Other muscle spasm: Secondary | ICD-10-CM

## 2020-01-12 LAB — POCT URINALYSIS DIPSTICK
Bilirubin, UA: NEGATIVE
Glucose, UA: NEGATIVE
Ketones, UA: NEGATIVE
Leukocytes, UA: NEGATIVE
Nitrite, UA: NEGATIVE
Protein, UA: NEGATIVE
Spec Grav, UA: 1.02 (ref 1.010–1.025)
Urobilinogen, UA: 0.2 E.U./dL
pH, UA: 6 (ref 5.0–8.0)

## 2020-01-12 LAB — GLUCOSE, POCT (MANUAL RESULT ENTRY): POC Glucose: 232 mg/dl — AB (ref 70–99)

## 2020-01-12 MED ORDER — GABAPENTIN 300 MG PO CAPS: ORAL_CAPSULE | ORAL | 3 refills | Status: DC

## 2020-01-12 MED ORDER — CYCLOBENZAPRINE HCL 10 MG PO TABS: 10.0000 mg | ORAL_TABLET | Freq: Three times a day (TID) | ORAL | 3 refills | Status: DC | PRN

## 2020-01-12 MED FILL — NOVOLOG MIX 70-30 FLEXPEN S: (70-30) 100 | 15 days supply | Qty: 9 | Fill #2

## 2020-01-12 MED FILL — SILDENAFIL CITRATE 100 MG T: 100 | 30 days supply | Qty: 10 | Fill #1

## 2020-01-12 MED FILL — CYCLOBENZAPRINE 10 MG TAB: 10 | 10 days supply | Qty: 30 | Fill #0

## 2020-01-12 MED FILL — GABAPENTIN 300 MG CAPSULE: 300 | 30 days supply | Qty: 120 | Fill #0

## 2020-01-12 NOTE — Patient Instructions (Signed)
Muscle Cramps and Spasms Muscle cramps and spasms are when muscles tighten by themselves. They usually get better within minutes. Muscle cramps are painful. They are usually stronger and last longer than muscle spasms. Muscle spasms may or may not be painful. They can last a few seconds or much longer. Cramps and spasms can affect any muscle, but they occur most often in the calf muscles of the leg. They are usually not caused by a serious problem. In many cases, the cause is not known. Some common causes include:  Doing more physical work or exercise than your body is ready for.  Using the muscles too much (overuse) by repeating certain movements too many times.  Staying in a certain position for a long time.  Playing a sport or doing an activity without preparing properly.  Using bad form or technique while playing a sport or doing an activity.  Not having enough water in your body (dehydration).  Injury.  Side effects of some medicines.  Low levels of the salts and minerals in your blood (electrolytes), such as low potassium or calcium. Follow these instructions at home: Managing pain and stiffness      Massage, stretch, and relax the muscle. Do this for many minutes at a time.  If told, put heat on tight or tense muscles as often as told by your doctor. Use the heat source that your doctor recommends, such as a moist heat pack or a heating pad. ? Place a towel between your skin and the heat source. ? Leave the heat on for 20-30 minutes. ? Remove the heat if your skin turns bright red. This is very important if you are not able to feel pain, heat, or cold. You may have a greater risk of getting burned.  If told, put ice on the affected area. This may help if you are sore or have pain after a cramp or spasm. ? Put ice in a plastic bag. ? Place a towel between your skin and the bag. ? Leave the ice on for 20 minutes, 2-3 times a day.  Try taking hot showers or baths to help  relax tight muscles. Eating and drinking  Drink enough fluid to keep your pee (urine) pale yellow.  Eat a healthy diet to help ensure that your muscles work well. This should include: ? Fruits and vegetables. ? Lean protein. ? Whole grains. ? Low-fat or nonfat dairy products. General instructions  If you are having cramps often, avoid intense exercise for several days.  Take over-the-counter and prescription medicines only as told by your doctor.  Watch for any changes in your symptoms.  Keep all follow-up visits as told by your doctor. This is important. Contact a doctor if:  Your cramps or spasms get worse or happen more often.  Your cramps or spasms do not get better with time. Summary  Muscle cramps and spasms are when muscles tighten by themselves. They usually get better within minutes.  Cramps and spasms occur most often in the calf muscles of the leg.  Massage, stretch, and relax the muscle. This may help the cramp or spasm go away.  Drink enough fluid to keep your pee (urine) pale yellow. This information is not intended to replace advice given to you by your health care provider. Make sure you discuss any questions you have with your health care provider. Document Revised: 04/08/2018 Document Reviewed: 04/08/2018 Elsevier Patient Education  2020 Elsevier Inc. Cyclobenzaprine tablets What is this medicine? CYCLOBENZAPRINE (sye kloe   BEN za preen) is a muscle relaxer. It is used to treat muscle pain, spasms, and stiffness. This medicine may be used for other purposes; ask your health care provider or pharmacist if you have questions. COMMON BRAND NAME(S): Fexmid, Flexeril What should I tell my health care provider before I take this medicine? They need to know if you have any of these conditions:  heart disease, irregular heartbeat, or previous heart attack  liver disease  thyroid problem  an unusual or allergic reaction to cyclobenzaprine, tricyclic  antidepressants, lactose, other medicines, foods, dyes, or preservatives  pregnant or trying to get pregnant  breast-feeding How should I use this medicine? Take this medicine by mouth with a glass of water. Follow the directions on the prescription label. If this medicine upsets your stomach, take it with food or milk. Take your medicine at regular intervals. Do not take it more often than directed. Talk to your pediatrician regarding the use of this medicine in children. Special care may be needed. Overdosage: If you think you have taken too much of this medicine contact a poison control center or emergency room at once. NOTE: This medicine is only for you. Do not share this medicine with others. What if I miss a dose? If you miss a dose, take it as soon as you can. If it is almost time for your next dose, take only that dose. Do not take double or extra doses. What may interact with this medicine? Do not take this medicine with any of the following medications:  MAOIs like Carbex, Eldepryl, Marplan, Nardil, and Parnate  narcotic medicines for cough  safinamide This medicine may also interact with the following medications:  alcohol  bupropion  antihistamines for allergy, cough and cold  certain medicines for anxiety or sleep  certain medicines for bladder problems like oxybutynin, tolterodine  certain medicines for depression like amitriptyline, fluoxetine, sertraline  certain medicines for Parkinson's disease like benztropine, trihexyphenidyl  certain medicines for seizures like phenobarbital, primidone  certain medicines for stomach problems like dicyclomine, hyoscyamine  certain medicines for travel sickness like scopolamine  general anesthetics like halothane, isoflurane, methoxyflurane, propofol  ipratropium  local anesthetics like lidocaine, pramoxine, tetracaine  medicines that relax muscles for surgery  narcotic medicines for pain  phenothiazines like  chlorpromazine, mesoridazine, prochlorperazine, thioridazine  verapamil This list may not describe all possible interactions. Give your health care provider a list of all the medicines, herbs, non-prescription drugs, or dietary supplements you use. Also tell them if you smoke, drink alcohol, or use illegal drugs. Some items may interact with your medicine. What should I watch for while using this medicine? Tell your doctor or health care professional if your symptoms do not start to get better or if they get worse. You may get drowsy or dizzy. Do not drive, use machinery, or do anything that needs mental alertness until you know how this medicine affects you. Do not stand or sit up quickly, especially if you are an older patient. This reduces the risk of dizzy or fainting spells. Alcohol may interfere with the effect of this medicine. Avoid alcoholic drinks. If you are taking another medicine that also causes drowsiness, you may have more side effects. Give your health care provider a list of all medicines you use. Your doctor will tell you how much medicine to take. Do not take more medicine than directed. Call emergency for help if you have problems breathing or unusual sleepiness. Your mouth may get dry. Chewing sugarless gum   or sucking hard candy, and drinking plenty of water may help. Contact your doctor if the problem does not go away or is severe. What side effects may I notice from receiving this medicine? Side effects that you should report to your doctor or health care professional as soon as possible:  allergic reactions like skin rash, itching or hives, swelling of the face, lips, or tongue  breathing problems  chest pain  fast, irregular heartbeat  hallucinations  seizures  unusually weak or tired Side effects that usually do not require medical attention (report to your doctor or health care professional if they continue or are bothersome):  headache  nausea, vomiting This  list may not describe all possible side effects. Call your doctor for medical advice about side effects. You may report side effects to FDA at 1-800-FDA-1088. Where should I keep my medicine? Keep out of the reach of children. Store at room temperature between 15 and 30 degrees C (59 and 86 degrees F). Keep container tightly closed. Throw away any unused medicine after the expiration date. NOTE: This sheet is a summary. It may not cover all possible information. If you have questions about this medicine, talk to your doctor, pharmacist, or health care provider.  2020 Elsevier/Gold Standard (2018-10-16 12:49:26)  

## 2020-01-12 NOTE — Progress Notes (Signed)
Patient [name]   Established Patient Office Visit  Subjective:  Patient ID: [name], male    DOB: 11/23/1961  Age: 59 y.o. MRN: [id number]  CC:  Chief Complaint  Patient presents with  . Follow-up    DM  . Foot tingling    Need foot exam  & micro alb    HPI [name] is a 59 year old male who presents for Follow Up today.   Past Medical History:  Diagnosis Date  . Anxiety   . CAD (coronary artery disease)   . CHF (congestive heart failure) ([place]) 09/2019  . Depression   . Diabetes mellitus   . Erectile dysfunction 11/2019  . H/O right heart catheterization 09/2019  . Hypertension   . Schizophrenia ([place])    Current Status: Since he last office visit, he is doing well with no complaints. He denies fatigue, frequent urination, blurred vision, excessive hunger, excessive thirst, weight gain, weight loss, and poor wound healing. He continues to check his feet regularly. He denies visual changes, chest pain, cough, shortness of breath, heart palpitations, and falls. He has occasional headaches and dizziness with position changes. Denies severe headaches, confusion, seizures, double vision, and blurred vision, nausea and vomiting. He continues to follow up with Psychiatry as needed. He denies fevers, chills, fatigue, recent infections, weight loss, and night sweats. No reports of GI problems such as diarrhea, and constipation. He has no reports of blood in stools, dysuria and hematuria. No depression or anxiety reported today. He denies suicidal ideations, homicidal ideations, or auditory hallucinations. He denies pain today.   Past Surgical History:  Procedure Laterality Date  . CORONARY STENT PLACEMENT    . HERNIA REPAIR    . RIGHT/LEFT HEART CATH AND CORONARY ANGIOGRAPHY N/A 10/14/2019   Procedure: RIGHT/LEFT HEART CATH AND CORONARY ANGIOGRAPHY;  Surgeon: Belva Crome, MD;  Location: [facility];   Service: Cardiovascular;  Laterality: N/A;    Family History  Problem Relation Age of Onset  . Heart failure Mother   . Mental illness Sister   . Mental illness Sister     Social History   Socioeconomic History  . Marital status: Legally Separated    Spouse name: Not on file  . Number of children: Not on file  . Years of education: Not on file  . Highest education level: Not on file  Occupational History  . Not on file  Tobacco Use  . 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 Smokeless tobacco: Never Used  Substance and Sexual Activity  . Alcohol use: No  . Drug use: No  . Sexual activity: Yes  Other Topics Concern  . Not on file  Social History Narrative  . Not on file   Social Determinants of Health   Financial Resource Strain: Low Risk   . Difficulty of Paying Living Expenses: Not very hard  Food Insecurity: No Food Insecurity  . Worried About Charity fundraiser in the Last Year: Never true  . Ran Out of Food in the Last Year: Never true  Transportation Needs: Unmet Transportation Needs  . Lack of Transportation (Medical): Yes  . Lack of Transportation (Non-Medical): Yes  Physical Activity:   . Days of Exercise per Week: Not on file  . Minutes of Exercise per Session: Not on file  Stress:   . Feeling of Stress : Not on file  Social Connections:   . Frequency of Communication with  Friends and Family: Not on file  . Frequency of Social Gatherings with Friends and Family: Not on file  . Attends Religious Services: Not on file  . Active Member of Clubs or Organizations: Not on file  . Attends Archivist Meetings: Not on file  . Marital Status: Not on file  Intimate Partner Violence:   . Fear of Current or Ex-Partner: Not on file  . Emotionally Abused: Not on file  . Physically Abused: Not on file  . Sexually Abused: Not on file    Outpatient Medications Prior to Visit  Medication Sig Dispense Refill  . apixaban (ELIQUIS) 5 MG TABS tablet Take 1 tablet  (5 mg total) by mouth 2 (two) times daily. 180 tablet 3  . BLACK CURRANT SEED OIL PO Take by mouth daily. 1 tsp every day    . blood glucose meter kit and supplies KIT Dispense based on patient and insurance preference. Use up to four times daily as directed. (FOR ICD-9 250.00, 250.01). 1 each 0  . carvedilol (COREG) 6.25 MG tablet Take 1 tablet (6.25 mg total) by mouth 2 (two) times daily with a meal. 180 tablet 3  . furosemide (LASIX) 40 MG tablet Take 1 tablet (40 mg total) by mouth 2 (two) times daily. 60 tablet 11  . Insulin Aspart Prot & Aspart (INSULIN ASP PROT & ASP FLEXPEN) (70-30) 100 UNIT/ML SUPN Inject 30 Units into the skin 2 (two) times daily. 3 mL 12  . potassium chloride SA (KLOR-CON) 20 MEQ tablet Take 1 tablet (20 mEq total) by mouth 2 (two) times daily. 60 tablet 6  . QUEtiapine (SEROQUEL) 100 MG tablet Take 1 tablet (100 mg total) by mouth at bedtime. 30 tablet 6  . spironolactone (ALDACTONE) 25 MG tablet Take 1 tablet (25 mg total) by mouth daily. (Patient taking differently: Take 25 mg by mouth at bedtime. ) 30 tablet 11  . Turmeric 500 MG CAPS Take by mouth.    . gabapentin (NEURONTIN) 300 MG capsule Take 1 capsule (300 mg total) by mouth 2 (two) times daily. 60 capsule 6  . Insulin Aspart Prot & Aspart (INSULIN ASP PROT & ASP FLEXPEN) (70-30) 100 UNIT/ML SUPN Inject 15 Units into the skin 2 (two) times daily before a meal. 15 mL 0  . Insulin Pen Needle (PEN NEEDLES) 30G X 5 MM MISC 1 each by Does not apply route 2 times daily at 12 noon and 4 pm. 100 each 0  . atorvastatin (LIPITOR) 40 MG tablet Take 1 tablet (40 mg total) by mouth daily at 6 PM. (Patient not taking: Reported on 01/12/2020) 90 tablet 3  . sacubitril-valsartan (ENTRESTO) 49-51 MG Take 1 tablet by mouth 2 (two) times daily. 60 tablet 6  . sildenafil (VIAGRA) 100 MG tablet Take 1 tablet (100 mg total) by mouth daily as needed for erectile dysfunction. (Patient not taking: Reported on 01/06/2020) 10 tablet 1   No  facility-administered medications prior to visit.    No Known Allergies  ROS Review of Systems  Constitutional: Negative.   HENT: Negative.   Eyes: Negative.   Respiratory: Negative.   Cardiovascular: Negative.   Gastrointestinal: Positive for abdominal distention (obese).  Endocrine: Negative.   Genitourinary: Negative.   Musculoskeletal: Negative.        Muscle spasms  Skin: Negative.   Allergic/Immunologic: Negative.   Neurological: Negative.   Hematological: Negative.   Psychiatric/Behavioral: Negative.       Objective:    Physical Exam  Constitutional:  He is oriented to person, place, and time. He appears well-developed and well-nourished.  HENT:  Head: Normocephalic and atraumatic.  Eyes: Conjunctivae are normal.  Cardiovascular: Normal rate, regular rhythm, normal heart sounds and intact distal pulses.  Pulmonary/Chest: Effort normal and breath sounds normal.  Abdominal: Soft. Bowel sounds are normal.  Musculoskeletal:        General: Normal range of motion.     Cervical back: Normal range of motion and neck supple.  Neurological: He is alert and oriented to person, place, and time. He has normal reflexes.  Skin: Skin is warm and dry.  Psychiatric: He has a normal mood and affect. His behavior is normal. Judgment and thought content normal.  Nursing note and vitals reviewed.   BP 105/62   Pulse 85   Temp 98.4 F (36.9 C) (Oral)   Ht '6\' 2"'  (1.88 m)   Wt 297 lb 6.4 oz (134.9 kg)   SpO2 99%   BMI 38.18 kg/m  Wt Readings from Last 3 Encounters:  01/12/20 297 lb 6.4 oz (134.9 kg)  01/08/20 299 lb 6.4 oz (135.8 kg)  01/06/20 296 lb (134.3 kg)     Health Maintenance Due  Topic Date Due  . Hepatitis C Screening  [birth year]  . FOOT EXAM  04/24/1970  . OPHTHALMOLOGY EXAM  04/24/1970  . URINE MICROALBUMIN  04/24/1970  . HIV Screening  04/25/1975  . TETANUS/TDAP  04/25/1979  . COLONOSCOPY  04/24/2010    There are no preventive care reminders to  display for this patient.  Lab Results  Component Value Date   TSH 4.110 12/02/2019   Lab Results  Component Value Date   WBC 6.4 12/02/2019   HGB 11.3 (L) 12/02/2019   HCT 36.1 (L) 12/02/2019   MCV 79 12/02/2019   PLT 306 12/02/2019   Lab Results  Component Value Date   NA 140 01/06/2020   K 4.5 01/06/2020   CO2 27 01/06/2020   GLUCOSE 136 (H) 01/06/2020   BUN 24 (H) 01/06/2020   CREATININE 1.48 (H) 01/06/2020   BILITOT 0.5 12/02/2019   ALKPHOS 128 (H) 12/02/2019   AST 21 12/02/2019   ALT 23 12/02/2019   PROT 7.1 12/02/2019   ALBUMIN 3.6 (L) 12/02/2019   CALCIUM 8.9 01/06/2020   ANIONGAP 9 01/06/2020   Lab Results  Component Value Date   CHOL 120 12/02/2019   Lab Results  Component Value Date   HDL 42 12/02/2019   Lab Results  Component Value Date   LDLCALC 66 12/02/2019   Lab Results  Component Value Date   TRIG 51 12/02/2019   Lab Results  Component Value Date   CHOLHDL 2.9 12/02/2019   Lab Results  Component Value Date   HGBA1C 10.4 (A) 12/02/2019      Assessment & Plan:   1. Type 2 diabetes mellitus with hyperglycemia, with long-term current use of insulin ([place]) He will continue medication as prescribed, to decrease foods/beverages high in sugars and carbs and follow Heart Healthy or DASH diet. Increase physical activity to at least 30 minutes cardio exercise daily.  - POCT glucose (manual entry) - POCT urinalysis dipstick - gabapentin (NEURONTIN) 300 MG capsule; Take 2 capsules (600 mg = total), by mouth, 2 times daily.  Dispense: 120 capsule; Refill: 3  2. Diabetic polyneuropathy associated with type 2 diabetes mellitus ([name])  3. Hemoglobin A1c > 9% indicating poor diabetic control Hgb A1c is at 10.4 today. Monitor.   4. Encounter for diabetic foot exam ([place])  Negative. Foot exam tolerated well. No areas of decreased sensitivity noted upon foot exam. Patient counseled on proper foot hygiene. He is encouraged to exam feet often (daily),  using mirror if necessary; keep feet clean and dry (especially between toes), keep feet moistened, wear cotton socks, and avoid wearing open-toed shoes, high-heel shoes, and sandals. Patient verbalized understanding.  - Ambulatory referral to Podiatry  5. Hyperglycemia  6. GAD (generalized anxiety disorder) Stable today.   7. Abnormal urinalysis Results are pending.  - Urine Culture  8. Muscle spasm - cyclobenzaprine (FLEXERIL) 10 MG tablet; Take 1 tablet (10 mg total) by mouth 3 (three) times daily as needed for muscle spasms.  Dispense: 30 tablet; Refill: 3  9. Follow up He will follow up in 3 months.   Meds ordered this encounter  Medications  . gabapentin (NEURONTIN) 300 MG capsule    Sig: Take 2 capsules (600 mg = total), by mouth, 2 times daily.    Dispense:  120 capsule    Refill:  3  . cyclobenzaprine (FLEXERIL) 10 MG tablet    Sig: Take 1 tablet (10 mg total) by mouth 3 (three) times daily as needed for muscle spasms.    Dispense:  30 tablet    Refill:  3    Orders Placed This Encounter  Procedures  . Urine Culture  . Ambulatory referral to Podiatry  . POCT glucose (manual entry)  . POCT urinalysis dipstick     Referral Orders     Ambulatory referral to [place],  MSN, FNP-BC [place],  [id number] [phone]- fax   Problem List Items Addressed This Visit      Endocrine   Diabetic polyneuropathy associated with type 2 diabetes mellitus ([place])   Relevant Medications   gabapentin (NEURONTIN) 300 MG capsule   cyclobenzaprine (FLEXERIL) 10 MG tablet   Type 2 diabetes mellitus with hyperglycemia, with long-term current use of insulin ([facility]) - Primary   Relevant Medications   gabapentin (NEURONTIN) 300 MG capsule   Other Relevant Orders   POCT glucose (manual entry) (Completed)   POCT urinalysis dipstick (Completed)      Other   GAD (generalized anxiety disorder)   Hyperglycemia    Other Visit Diagnoses    Controlled type 2 diabetes with neuropathy ([place])       Relevant Medications   gabapentin (NEURONTIN) 300 MG capsule   Encounter for diabetic foot exam ([place])       Relevant Orders   Ambulatory referral to Podiatry   Abnormal urinalysis       Relevant Orders   Urine Culture   Muscle spasm       Relevant Medications   cyclobenzaprine (FLEXERIL) 10 MG tablet   Follow up          Meds ordered this encounter  Medications  . gabapentin (NEURONTIN) 300 MG capsule    Sig: Take 2 capsules (600 mg = total), by mouth, 2 times daily.    Dispense:  120 capsule    Refill:  3  . cyclobenzaprine (FLEXERIL) 10 MG tablet    Sig: Take 1 tablet (10 mg total) by mouth 3 (three) times daily as needed for muscle spasms.    Dispense:  30 tablet    Refill:  3    Follow-up: Return in about 3 months (around 04/10/2020).    Azzie Glatter, FNP

## 2020-01-14 ENCOUNTER — Other Ambulatory Visit (HOSPITAL_COMMUNITY): Payer: Self-pay

## 2020-01-14 LAB — URINE CULTURE: Organism ID, Bacteria: NO GROWTH

## 2020-01-14 NOTE — Progress Notes (Signed)
Attempted to contact [name] multiple times this morning with no success. I arrived at [name] home upon the time of our scheduled appointment and no answer was made at the door. I will continue to follow up with patient to schedule another home visit.

## 2020-01-20 ENCOUNTER — Other Ambulatory Visit (HOSPITAL_COMMUNITY): Payer: Self-pay

## 2020-01-20 MED FILL — ?CARVEDILOL 6.25 MG TABLET: 6.25 | 30 days supply | Qty: 60 | Fill #1

## 2020-01-20 MED FILL — ?FUROSEMIDE 40 MG TABLET: 40 | 30 days supply | Qty: 60 | Fill #2

## 2020-01-20 MED FILL — ?SPIRONOLACTONE 25 MG TABLE: 25 | 30 days supply | Qty: 30 | Fill #1

## 2020-01-20 NOTE — Progress Notes (Signed)
Paramedicine Encounter    Patient ID: [name], male    DOB: [birth year], 60 y.o.   MRN: [id number]   Patient Care Team: Nolene Ebbs, MD as PCP - General (Internal Medicine) [name], MD as PCP - Cardiology (Cardiology)  Patient Active Problem List   Diagnosis Date Noted  . Type 2 diabetes mellitus with hyperglycemia, with long-term current use of insulin ([place]) 12/03/2019  . Diabetic polyneuropathy associated with type 2 diabetes mellitus ([place]) 12/03/2019  . Hyperglycemia 12/03/2019  . History of hyperglycemia 12/03/2019  . Erectile dysfunction 12/03/2019  . CHF (congestive heart failure) ([place]) 10/11/2019  . Paranoid schizophrenia, chronic condition ([place]) 01/26/2015  . Severe recurrent major depressive disorder with psychotic features ([place]) 01/26/2015  . GAD (generalized anxiety disorder) 01/26/2015  . OCD (obsessive compulsive disorder) 01/26/2015  . Panic disorder without agoraphobia 01/26/2015  . Insomnia 01/26/2015    Current Outpatient Medications:  .  apixaban (ELIQUIS) 5 MG TABS tablet, Take 1 tablet (5 mg total) by mouth 2 (two) times daily., Disp: 180 tablet, Rfl: 3 .  atorvastatin (LIPITOR) 40 MG tablet, Take 1 tablet (40 mg total) by mouth daily at 6 PM., Disp: 90 tablet, Rfl: 3 .  BLACK CURRANT SEED OIL PO, Take by mouth daily. 1 tsp every day, Disp: , Rfl:  .  blood glucose meter kit and supplies KIT, Dispense based on patient and insurance preference. Use up to four times daily as directed. (FOR ICD-9 250.00, 250.01)., Disp: 1 each, Rfl: 0 .  carvedilol (COREG) 6.25 MG tablet, Take 1 tablet (6.25 mg total) by mouth 2 (two) times daily with a meal., Disp: 180 tablet, Rfl: 3 .  cyclobenzaprine (FLEXERIL) 10 MG tablet, Take 1 tablet (10 mg total) by mouth 3 (three) times daily as needed for muscle spasms., Disp: 30 tablet, Rfl: 3 .  furosemide (LASIX) 40 MG tablet, Take 1 tablet (40 mg total) by mouth 2 (two) times daily., Disp: 60 tablet, Rfl: 11 .   gabapentin (NEURONTIN) 300 MG capsule, Take 2 capsules (600 mg = total), by mouth, 2 times daily., Disp: 120 capsule, Rfl: 3 .  Insulin Aspart Prot & Aspart (INSULIN ASP PROT & ASP FLEXPEN) (70-30) 100 UNIT/ML SUPN, Inject 30 Units into the skin 2 (two) times daily., Disp: 3 mL, Rfl: 12 .  QUEtiapine (SEROQUEL) 100 MG tablet, Take 1 tablet (100 mg total) by mouth at bedtime., Disp: 30 tablet, Rfl: 6 .  sildenafil (VIAGRA) 100 MG tablet, Take 1 tablet (100 mg total) by mouth daily as needed for erectile dysfunction., Disp: 10 tablet, Rfl: 1 .  spironolactone (ALDACTONE) 25 MG tablet, Take 1 tablet (25 mg total) by mouth daily. (Patient taking differently: Take 25 mg by mouth at bedtime. ), Disp: 30 tablet, Rfl: 11 .  Turmeric 500 MG CAPS, Take by mouth., Disp: , Rfl:  .  potassium chloride SA (KLOR-CON) 20 MEQ tablet, Take 1 tablet (20 mEq total) by mouth 2 (two) times daily. (Patient not taking: Reported on 01/20/2020), Disp: 60 tablet, Rfl: 6 .  sacubitril-valsartan (ENTRESTO) 49-51 MG, Take 1 tablet by mouth 2 (two) times daily. (Patient not taking: Reported on 01/20/2020), Disp: 60 tablet, Rfl: 6 No Known Allergies   Social History   Socioeconomic History  . Marital status: Legally Separated    Spouse name: Not on file  . Number of children: Not on file  . Years of education: Not on file  . Highest education level: Not on file  Occupational History  .  Not on file  Tobacco Use  . 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 Smokeless tobacco: Never Used  Substance and Sexual Activity  . Alcohol use: No  . Drug use: No  . Sexual activity: Yes  Other Topics Concern  . Not on file  Social History Narrative  . Not on file   Social Determinants of Health   Financial Resource Strain: Low Risk   . Difficulty of Paying Living Expenses: Not very hard  Food Insecurity: No Food Insecurity  . Worried About Charity fundraiser in the Last Year: Never true  . Ran Out of Food in the Last Year: Never true   Transportation Needs: Unmet Transportation Needs  . Lack of Transportation (Medical): Yes  . Lack of Transportation (Non-Medical): Yes  Physical Activity:   . Days of Exercise per Week: Not on file  . Minutes of Exercise per Session: Not on file  Stress:   . Feeling of Stress : Not on file  Social Connections:   . Frequency of Communication with Friends and Family: Not on file  . Frequency of Social Gatherings with Friends and Family: Not on file  . Attends Religious Services: Not on file  . Active Member of Clubs or Organizations: Not on file  . Attends Archivist Meetings: Not on file  . Marital Status: Not on file  Intimate Partner Violence:   . Fear of Current or Ex-Partner: Not on file  . Emotionally Abused: Not on file  . Physically Abused: Not on file  . Sexually Abused: Not on file    Physical Exam Vitals reviewed.  Constitutional:      Appearance: He is normal weight.  HENT:     Head: Normocephalic.     Nose: Nose normal.     Mouth/Throat:     Mouth: Mucous membranes are moist.  Eyes:     Pupils: Pupils are equal, round, and reactive to light.  Cardiovascular:     Rate and Rhythm: Normal rate.     Pulses: Normal pulses.  Pulmonary:     Effort: Pulmonary effort is normal.     Breath sounds: Normal breath sounds.  Abdominal:     Palpations: Abdomen is soft.  Musculoskeletal:        General: Normal range of motion.     Cervical back: Normal range of motion.  Skin:    General: Skin is warm and dry.     Capillary Refill: Capillary refill takes less than 2 seconds.  Neurological:     Mental Status: He is alert. Mental status is at baseline.  Psychiatric:        Mood and Affect: Mood normal.     Arrived for home visit for [name] who was alert and oriented with no complaints reporting he is without multiple medications. I was able to get his medications from [name] and the pharmacist was able to get medications placed through [facility] so 3  medications were free. [name] advised his [name] would be delivered Thursday. [name] was reminded of his appointment a HF clinic on Thursday. I will be attending same with patient. [name] denied shortness of breath, dizziness, chest pain or any trouble sleeping. [name] vitals were obtained and are as noted. Medications were reviewed and confirmed. Pill box filled accordingly. [name] had no other needs to meet at this time. Home visit complete. Will follow up at appointment Thursday and visit next Tuesday.    Current pill box (awaiting same  picked up)  -no entresto -no potassium -no atorvastatin   Refills: Seroquel Atorvastatin Flexiril Eliquis  Potassium  Novolog     Future Appointments  Date Time Provider [place]  01/22/2020  3:00 PM [facility] PA/NP [facility] None  02/03/2020  4:00 PM [name], MD [facility]  04/09/2020  8:00 AM Azzie Glatter, FNP [facility] None     ACTION: Home visit completed Next visit planned for one week

## 2020-01-21 MED FILL — QUETIAPINE FUMARATE 100 MG: 100 | 30 days supply | Qty: 30 | Fill #2

## 2020-01-21 MED FILL — CYCLOBENZAPRINE 10 MG TAB: 10 | 10 days supply | Qty: 30 | Fill #1

## 2020-01-21 MED FILL — POTASSIUM CL ER 20 MEQ TABL: 20 | 30 days supply | Qty: 60 | Fill #1

## 2020-01-21 MED FILL — ?ATORVASTATIN 40MG TABLET: 40 | 30 days supply | Qty: 30 | Fill #2

## 2020-01-21 MED FILL — !ELIQUIS 5MG TABLET: 5 | 30 days supply | Qty: 60 | Fill #2

## 2020-01-22 ENCOUNTER — Other Ambulatory Visit (HOSPITAL_COMMUNITY): Payer: Self-pay

## 2020-01-22 ENCOUNTER — Other Ambulatory Visit: Payer: Self-pay

## 2020-01-22 ENCOUNTER — Encounter (HOSPITAL_COMMUNITY): Payer: Self-pay

## 2020-01-22 ENCOUNTER — Ambulatory Visit (HOSPITAL_COMMUNITY)
Admission: RE | Admit: 2020-01-22 | Discharge: 2020-01-22 | Disposition: A | Discharge status: Home / Self Care | Payer: Medicare Other | Source: Ambulatory Visit | Attending: Adult Health | Admitting: Adult Health

## 2020-01-22 VITALS — BP 152/98 | HR 84 | Wt 300.6 lb

## 2020-01-22 DIAGNOSIS — E119 Type 2 diabetes mellitus without complications: Secondary | ICD-10-CM | POA: Insufficient documentation

## 2020-01-22 DIAGNOSIS — Z8249 Family history of ischemic heart disease and other diseases of the circulatory system: Secondary | ICD-10-CM | POA: Insufficient documentation

## 2020-01-22 DIAGNOSIS — I480 Paroxysmal atrial fibrillation: Secondary | ICD-10-CM | POA: Diagnosis not present

## 2020-01-22 DIAGNOSIS — I5022 Chronic systolic (congestive) heart failure: Secondary | ICD-10-CM | POA: Insufficient documentation

## 2020-01-22 DIAGNOSIS — F209 Schizophrenia, unspecified: Secondary | ICD-10-CM | POA: Diagnosis not present

## 2020-01-22 DIAGNOSIS — Z794 Long term (current) use of insulin: Secondary | ICD-10-CM | POA: Diagnosis not present

## 2020-01-22 DIAGNOSIS — I10 Essential (primary) hypertension: Secondary | ICD-10-CM

## 2020-01-22 DIAGNOSIS — I2510 Atherosclerotic heart disease of native coronary artery without angina pectoris: Secondary | ICD-10-CM | POA: Insufficient documentation

## 2020-01-22 DIAGNOSIS — E785 Hyperlipidemia, unspecified: Secondary | ICD-10-CM | POA: Diagnosis not present

## 2020-01-22 DIAGNOSIS — Z87891 Personal history of nicotine dependence: Secondary | ICD-10-CM | POA: Diagnosis not present

## 2020-01-22 DIAGNOSIS — I110 Hypertensive heart disease with heart failure: Secondary | ICD-10-CM | POA: Insufficient documentation

## 2020-01-22 DIAGNOSIS — Z9989 Dependence on other enabling machines and devices: Secondary | ICD-10-CM

## 2020-01-22 DIAGNOSIS — Z955 Presence of coronary angioplasty implant and graft: Secondary | ICD-10-CM | POA: Diagnosis not present

## 2020-01-22 DIAGNOSIS — G4733 Obstructive sleep apnea (adult) (pediatric): Secondary | ICD-10-CM

## 2020-01-22 DIAGNOSIS — Z7901 Long term (current) use of anticoagulants: Secondary | ICD-10-CM | POA: Diagnosis not present

## 2020-01-22 DIAGNOSIS — Z79899 Other long term (current) drug therapy: Secondary | ICD-10-CM | POA: Diagnosis not present

## 2020-01-22 MED ORDER — CARVEDILOL 6.25 MG PO TABS: 9.3750 mg | ORAL_TABLET | Freq: Two times a day (BID) | ORAL | 1 refills | Status: DC

## 2020-01-22 NOTE — Progress Notes (Signed)
Met with [name] today in clinic where he reports feeling "tired". [name] was seen by [name] who increased Carvedilol to 9.375 twice daily and Spironolactone 51m daily. Patient will be seen on 3/11 again for labs/echo and follow up. I will continue to follow with medications changes an pill box placement. Patient needs follow up with PCP, I will assist patient in scheduling same.

## 2020-01-22 NOTE — Patient Instructions (Signed)
INCREASE Carvedilol to 9.375mg  (1.5 tab) two times daily.  Please follow up with the [facility] in 2 weeks. Lab work will be done at that visit.  At the [facility], you and your health needs are our priority. As part of our continuing mission to provide you with exceptional heart care, we have created designated Provider Care Teams. These Care Teams include your primary Cardiologist (physician) and Advanced Practice Providers (APPs- Physician Assistants and Nurse Practitioners) who all work together to provide you with the care you need, when you need it.   You may see any of the following providers on your designated Care Team at your next follow up: [name] Kitchen Dr Glori Bickers . Dr Loralie Champagne . Darrick Grinder, NP . Lyda Jester, PA . [name], PharmD   Please be sure to bring in all your medications bottles to every appointment.

## 2020-01-22 NOTE — Progress Notes (Signed)
PCP: Nolene Ebbs, MD Cardiology: Dr. Radford Pax HF Cardiology: Dr. Aundra Dubin  He can write his name only. He is only able to read a few words. Thinks he completed the 7th grade.    58 yo with history of chronic systolic CHF, CAD, type 2 diabetes, paroxysmal atrial fibrillation, and schizophrenia was referred by Dr. Radford Pax for evaluation of CHF.  Patient had OM2 PCI in 2012.  In 11/20, he was admitted with CHF. Echo showed EF 25-30% with diffuse hypokinesis.  LHC was done, showing occluded OM2 at prior stent, 90% D1 stenosis, and extensive diffuse RCA disease.  No intervention.  He was thought to be in paroxysmal atrial fibrillation during this appointment and apixaban was started.   He does not smoke, rarely drinks, and does not use drugs.  His mother had "heart problems."    Today he returns for HF follow up.Overall feeling fine. SOB inclines. Denies PND/Orthopnea. Appetite ok. No fever or chills. Weight at home 288-300  pounds. Taking all medications. He has been out entresto for 2 days. Just received entresto from patient assistance today. Followed by HF Paramedicine.   Social History --->Says he can read just a little bit. He can write his name only. Says has a hard time reading medication labels. He doesn't know how to take a 1/2 tablet. Has transportation issues.  Lives with his sister.   Labs (1/21): LDL 66, HDL 42, hgb 11.3, K 4.7, creatinine 1.32  ECG (12/20): NSR, PACs, lateral TWIs concerning for ischemia.   PMH:  1. Atrial fibrillation: Paroxysmal 2. Type 2 diabetes 3. HTN 4. Hyperlipidemia 5. Schizophrenia 6. CAD: PCI OM2 in 2012.  - LHC (11/20): 90% D1 stenosis, totally occluded OM2 at stent, serial 85%/70%/60% RCA stenoses.  7. 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   - Echo (11/20): EF 25-30%, global hypokinesis.   Social History   Socioeconomic History  . Marital status: Legally Separated    Spouse name: Not on file  . Number of  children: Not on file  . Years of education: Not on file  . Highest education level: Not on file  Occupational History  . Not on file  Tobacco Use  . 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 Smokeless tobacco: Never Used  Substance and Sexual Activity  . Alcohol use: No  . Drug use: No  . Sexual activity: Yes  Other Topics Concern  . Not on file  Social History Narrative  . Not on file   Social Determinants of Health   Financial Resource Strain: Low Risk   . Difficulty of Paying Living Expenses: Not very hard  Food Insecurity: No Food Insecurity  . Worried About Charity fundraiser in the Last Year: Never true  . Ran Out of Food in the Last Year: Never true  Transportation Needs: Unmet Transportation Needs  . Lack of Transportation (Medical): Yes  . Lack of Transportation (Non-Medical): Yes  Physical Activity:   . Days of Exercise per Week: Not on file  . Minutes of Exercise per Session: Not on file  Stress:   . Feeling of Stress : Not on file  Social Connections:   . Frequency of Communication with Friends and Family: Not on file  . Frequency of Social Gatherings with Friends and Family: Not on file  . Attends Religious Services: Not on file  . Active Member of Clubs or Organizations: Not on file  . Attends Archivist Meetings: Not on file  . Marital Status: Not on  file  Intimate Partner Violence:   . Fear of Current or Ex-Partner: Not on file  . Emotionally Abused: Not on file  . Physically Abused: Not on file  . Sexually Abused: Not on file   Family History  Problem Relation Age of Onset  . Heart failure Mother   . Mental illness Sister   . Mental illness Sister    ROS: All systems reviewed and negative except as per HPI.  Current Meds  Medication Sig  . apixaban (ELIQUIS) 5 MG TABS tablet Take 1 tablet (5 mg total) by mouth 2 (two) times daily.  [name] Kitchen atorvastatin (LIPITOR) 40 MG tablet Take 1 tablet (40 mg total) by mouth daily at 6 PM.  . carvedilol (COREG)  6.25 MG tablet Take 1 tablet (6.25 mg total) by mouth 2 (two) times daily with a meal.  . cyclobenzaprine (FLEXERIL) 10 MG tablet Take 1 tablet (10 mg total) by mouth 3 (three) times daily as needed for muscle spasms.  . furosemide (LASIX) 40 MG tablet Take 1 tablet (40 mg total) by mouth 2 (two) times daily.  . Insulin Aspart Prot & Aspart (INSULIN ASP PROT & ASP FLEXPEN) (70-30) 100 UNIT/ML SUPN Inject 30 Units into the skin 2 (two) times daily.  . potassium chloride SA (KLOR-CON) 20 MEQ tablet Take 1 tablet (20 mEq total) by mouth 2 (two) times daily.  . QUEtiapine (SEROQUEL) 100 MG tablet Take 1 tablet (100 mg total) by mouth at bedtime.  . sacubitril-valsartan (ENTRESTO) 49-51 MG Take 1 tablet by mouth 2 (two) times daily.  [name] Kitchen spironolactone (ALDACTONE) 25 MG tablet Take 1 tablet (25 mg total) by mouth daily. (Patient taking differently: Take 25 mg by mouth at bedtime. )   BP (!) 152/98   Pulse 84   Wt (!) 136.4 kg (300 lb 9.6 oz)   SpO2 93%   BMI 38.59 kg/m   Wt Readings from Last 3 Encounters:  01/22/20 (!) 136.4 kg (300 lb 9.6 oz)  01/20/20 130.6 kg (288 lb)  01/12/20 134.9 kg (297 lb 6.4 oz)   .med Vitals:   01/22/20 1454  BP: (!) 152/98  Pulse: 84  SpO2: 93%   General:  Well appearing. No resp difficulty HEENT: normal Neck: supple. no JVD. Carotids 2+ bilat; no bruits. No lymphadenopathy or thryomegaly appreciated. Cor: PMI nondisplaced. Regular rate & rhythm. No rubs, gallops or murmurs. Lungs: clear Abdomen: soft, nontender, nondistended. No hepatosplenomegaly. No bruits or masses. Good bowel sounds. Extremities: no cyanosis, clubbing, rash, edema Neuro: alert & orientedx3, cranial nerves grossly intact. moves all 4 extremities w/o difficulty. Affect pleasant  Assessment/Plan: 1. Chronic systolic CHF: Echo in [id number] with EF 25-30%.  Suspect mixed ischemic/nonischemic cardiomyopathy.   - We need to keep medication regimen as simple as possible. He has low literacy.    NYHA III. Volume status stable. Continue lasix to 40 mg twice a day.  -Restart entresto  49-51 mg twice a day today  - Increase  Coreg 9.375 mg twice a day.   - Change spiro to 25 mg daily.  - No SGLTi with A1C 10.4. - Repeat echo in the future after medical management to decide on ICD.  Narrow QRS so not CRT candidate. Set up next repeat ECHO next visit.   2. CAD: Last cath in 11/20 with occluded OM2 stent, 90% D1, severe diffuse disease in the RCA (no intervention). No chest pain.  - No ASA given apixaban use.  - Continue atorvastatin. Good lipids in 1/21.  3. Atrial fibrillation: Paroxysmal.   - Regular on exam.  - No bleeding issues.  - Continue apixaban 5 mg bid.   4. DMII -On insulin.  - Last Hgb A1C 10.4 . Check in 3 months.  Needs follow up with PCP.   5. OSA Continue CPAP. Reinforced nightly use. Only using a couple nights a week.   6. HTN Uncontrolled restarting entresto today and increasing coreg.   Follow up in 2 weeks. Check BMET and ECHO at that time.     Joel Flannery NP-C  01/22/2020

## 2020-01-27 ENCOUNTER — Other Ambulatory Visit (HOSPITAL_COMMUNITY): Payer: Self-pay

## 2020-01-27 MED FILL — GABAPENTIN 300 MG CAPSULE: 300 | 30 days supply | Qty: 120 | Fill #0

## 2020-01-27 MED FILL — NOVOLOG MIX 70-30 FLEXPEN S: (70-30) 100 | 15 days supply | Qty: 9 | Fill #3

## 2020-01-27 NOTE — Progress Notes (Signed)
Paramedicine Encounter    Patient ID: [name], male    DOB: October 28, 1961, 59 y.o.   MRN: [id number]   Patient Care Team: Nolene Ebbs, MD as PCP - General (Internal Medicine) [name], MD as PCP - Cardiology (Cardiology)  Patient Active Problem List   Diagnosis Date Noted  . Type 2 diabetes mellitus with hyperglycemia, with long-term current use of insulin ([place]) 12/03/2019  . Diabetic polyneuropathy associated with type 2 diabetes mellitus ([place]) 12/03/2019  . Hyperglycemia 12/03/2019  . History of hyperglycemia 12/03/2019  . Erectile dysfunction 12/03/2019  . CHF (congestive heart failure) ([place]) 10/11/2019  . Paranoid schizophrenia, chronic condition ([place]) 01/26/2015  . Severe recurrent major depressive disorder with psychotic features ([place]) 01/26/2015  . GAD (generalized anxiety disorder) 01/26/2015  . OCD (obsessive compulsive disorder) 01/26/2015  . Panic disorder without agoraphobia 01/26/2015  . Insomnia 01/26/2015    Current Outpatient Medications:  .  apixaban (ELIQUIS) 5 MG TABS tablet, Take 1 tablet (5 mg total) by mouth 2 (two) times daily., Disp: 180 tablet, Rfl: 3 .  BLACK CURRANT SEED OIL PO, Take by mouth daily. 1 tsp every day, Disp: , Rfl:  .  blood glucose meter kit and supplies KIT, Dispense based on patient and insurance preference. Use up to four times daily as directed. (FOR ICD-9 250.00, 250.01)., Disp: 1 each, Rfl: 0 .  carvedilol (COREG) 6.25 MG tablet, Take 1.5 tablets (9.375 mg total) by mouth 2 (two) times daily with a meal., Disp: 270 tablet, Rfl: 1 .  cyclobenzaprine (FLEXERIL) 10 MG tablet, Take 1 tablet (10 mg total) by mouth 3 (three) times daily as needed for muscle spasms., Disp: 30 tablet, Rfl: 3 .  potassium chloride SA (KLOR-CON) 20 MEQ tablet, Take 1 tablet (20 mEq total) by mouth 2 (two) times daily., Disp: 60 tablet, Rfl: 6 .  QUEtiapine (SEROQUEL) 100 MG tablet, Take 1 tablet (100 mg total) by mouth at bedtime., Disp: 30  tablet, Rfl: 6 .  sacubitril-valsartan (ENTRESTO) 49-51 MG, Take 1 tablet by mouth 2 (two) times daily., Disp: 60 tablet, Rfl: 6 .  spironolactone (ALDACTONE) 25 MG tablet, Take 1 tablet (25 mg total) by mouth daily. (Patient taking differently: Take 25 mg by mouth at bedtime. ), Disp: 30 tablet, Rfl: 11 .  atorvastatin (LIPITOR) 40 MG tablet, Take 1 tablet (40 mg total) by mouth daily at 6 PM., Disp: 90 tablet, Rfl: 3 .  furosemide (LASIX) 40 MG tablet, Take 1 tablet (40 mg total) by mouth 2 (two) times daily., Disp: 60 tablet, Rfl: 11 .  gabapentin (NEURONTIN) 300 MG capsule, Take 2 capsules (600 mg = total), by mouth, 2 times daily., Disp: 120 capsule, Rfl: 3 .  Insulin Aspart Prot & Aspart (INSULIN ASP PROT & ASP FLEXPEN) (70-30) 100 UNIT/ML SUPN, Inject 30 Units into the skin 2 (two) times daily., Disp: 3 mL, Rfl: 12 .  sildenafil (VIAGRA) 100 MG tablet, Take 1 tablet (100 mg total) by mouth daily as needed for erectile dysfunction., Disp: 10 tablet, Rfl: 1 .  Turmeric 500 MG CAPS, Take by mouth., Disp: , Rfl:  No Known Allergies   Social History   Socioeconomic History  . Marital status: Legally Separated    Spouse name: Not on file  . Number of children: Not on file  . Years of education: Not on file  . Highest education level: Not on file  Occupational History  . Not on file  Tobacco Use  . Smoking status: Former  Smoker  . Smokeless tobacco: Never Used  Substance and Sexual Activity  . Alcohol use: No  . Drug use: No  . Sexual activity: Yes  Other Topics Concern  . Not on file  Social History Narrative  . Not on file   Social Determinants of Health   Financial Resource Strain: Low Risk   . Difficulty of Paying Living Expenses: Not very hard  Food Insecurity: No Food Insecurity  . Worried About Charity fundraiser in the Last Year: Never true  . Ran Out of Food in the Last Year: Never true  Transportation Needs: Unmet Transportation Needs  . Lack of Transportation  (Medical): Yes  . Lack of Transportation (Non-Medical): Yes  Physical Activity:   . Days of Exercise per Week: Not on file  . Minutes of Exercise per Session: Not on file  Stress:   . Feeling of Stress : Not on file  Social Connections:   . Frequency of Communication with Friends and Family: Not on file  . Frequency of Social Gatherings with Friends and Family: Not on file  . Attends Religious Services: Not on file  . Active Member of Clubs or Organizations: Not on file  . Attends Archivist Meetings: Not on file  . Marital Status: Not on file  Intimate Partner Violence:   . Fear of Current or Ex-Partner: Not on file  . Emotionally Abused: Not on file  . Physically Abused: Not on file  . Sexually Abused: Not on file    Physical Exam Vitals reviewed.  Constitutional:      Appearance: He is normal weight.  HENT:     Head: Normocephalic.     Nose: Nose normal.     Mouth/Throat:     Mouth: Mucous membranes are moist.  Eyes:     Pupils: Pupils are equal, round, and reactive to light.  Cardiovascular:     Rate and Rhythm: Normal rate and regular rhythm.     Pulses: Normal pulses.  Pulmonary:     Effort: Pulmonary effort is normal.     Breath sounds: Normal breath sounds.  Abdominal:     Palpations: Abdomen is soft.  Musculoskeletal:        General: Normal range of motion.     Cervical back: Normal range of motion.  Skin:    General: Skin is warm and dry.     Capillary Refill: Capillary refill takes less than 2 seconds.  Neurological:     Mental Status: He is alert. Mental status is at baseline.  Psychiatric:        Mood and Affect: Mood normal.    Arrived for home visit for Mr. Denz who was alert and oriented with no complaints today on assessment. Vitals were obtained and are as noted. Physical assessment revealed no signs of edema. Lung sounds were clear and equal. Patient had all medications except for Gabapentin. I contacted pharmacy and they stated they  will refill same today and he is able to pick that and insulin up at the same time. Patient requested I schedule his PCP appointment. Will do same today. Patient agreed to visit in one week. Home visit completed.   Weight today- 294lbs   CBG- 350 (just took insulin 10 mins PTA)  Refills: Gabapentin      Future Appointments  Date Time Provider [place]  02/03/2020  4:00 PM [name], MD [facility]  02/05/2020 10:30 AM [facility] PA/NP [facility] None  04/09/2020  8:00  AM Azzie Glatter, FNP [facility] None     ACTION: Home visit completed Next visit planned for one week

## 2020-02-03 ENCOUNTER — Other Ambulatory Visit (HOSPITAL_COMMUNITY): Payer: Self-pay

## 2020-02-03 ENCOUNTER — Ambulatory Visit: Payer: Self-pay | Admitting: Cardiology

## 2020-02-03 NOTE — Progress Notes (Signed)
Paramedicine Encounter    Patient ID: [name], male    DOB: [birth year], 60 y.o.   MRN: [id number]   Patient Care Team: Nolene Ebbs, MD as PCP - General (Internal Medicine) [name], MD as PCP - Cardiology (Cardiology)  Patient Active Problem List   Diagnosis Date Noted  . Type 2 diabetes mellitus with hyperglycemia, with long-term current use of insulin ([place]) 12/03/2019  . Diabetic polyneuropathy associated with type 2 diabetes mellitus ([place]) 12/03/2019  . Hyperglycemia 12/03/2019  . History of hyperglycemia 12/03/2019  . Erectile dysfunction 12/03/2019  . CHF (congestive heart failure) ([place]) 10/11/2019  . Paranoid schizophrenia, chronic condition ([place]) 01/26/2015  . Severe recurrent major depressive disorder with psychotic features ([place]) 01/26/2015  . GAD (generalized anxiety disorder) 01/26/2015  . OCD (obsessive compulsive disorder) 01/26/2015  . Panic disorder without agoraphobia 01/26/2015  . Insomnia 01/26/2015    Current Outpatient Medications:  .  apixaban (ELIQUIS) 5 MG TABS tablet, Take 1 tablet (5 mg total) by mouth 2 (two) times daily., Disp: 180 tablet, Rfl: 3 .  atorvastatin (LIPITOR) 40 MG tablet, Take 1 tablet (40 mg total) by mouth daily at 6 PM., Disp: 90 tablet, Rfl: 3 .  blood glucose meter kit and supplies KIT, Dispense based on patient and insurance preference. Use up to four times daily as directed. (FOR ICD-9 250.00, 250.01)., Disp: 1 each, Rfl: 0 .  carvedilol (COREG) 6.25 MG tablet, Take 1.5 tablets (9.375 mg total) by mouth 2 (two) times daily with a meal., Disp: 270 tablet, Rfl: 1 .  cyclobenzaprine (FLEXERIL) 10 MG tablet, Take 1 tablet (10 mg total) by mouth 3 (three) times daily as needed for muscle spasms., Disp: 30 tablet, Rfl: 3 .  furosemide (LASIX) 40 MG tablet, Take 1 tablet (40 mg total) by mouth 2 (two) times daily., Disp: 60 tablet, Rfl: 11 .  gabapentin (NEURONTIN) 300 MG capsule, Take 2 capsules (600 mg = total), by  mouth, 2 times daily., Disp: 120 capsule, Rfl: 3 .  Insulin Aspart Prot & Aspart (INSULIN ASP PROT & ASP FLEXPEN) (70-30) 100 UNIT/ML SUPN, Inject 30 Units into the skin 2 (two) times daily., Disp: 3 mL, Rfl: 12 .  potassium chloride SA (KLOR-CON) 20 MEQ tablet, Take 1 tablet (20 mEq total) by mouth 2 (two) times daily., Disp: 60 tablet, Rfl: 6 .  QUEtiapine (SEROQUEL) 100 MG tablet, Take 1 tablet (100 mg total) by mouth at bedtime., Disp: 30 tablet, Rfl: 6 .  sacubitril-valsartan (ENTRESTO) 49-51 MG, Take 1 tablet by mouth 2 (two) times daily., Disp: 60 tablet, Rfl: 6 .  sildenafil (VIAGRA) 100 MG tablet, Take 1 tablet (100 mg total) by mouth daily as needed for erectile dysfunction., Disp: 10 tablet, Rfl: 1 .  spironolactone (ALDACTONE) 25 MG tablet, Take 1 tablet (25 mg total) by mouth daily. (Patient taking differently: Take 25 mg by mouth at bedtime. ), Disp: 30 tablet, Rfl: 11 .  Turmeric 500 MG CAPS, Take by mouth., Disp: , Rfl:  .  BLACK CURRANT SEED OIL PO, Take by mouth daily. 1 tsp every day, Disp: , Rfl:  No Known Allergies   Social History   Socioeconomic History  . Marital status: Legally Separated    Spouse name: Not on file  . Number of children: Not on file  . Years of education: Not on file  . Highest education level: Not on file  Occupational History  . Not on file  Tobacco Use  . Smoking status: Former  Smoker  . Smokeless tobacco: Never Used  Substance and Sexual Activity  . Alcohol use: No  . Drug use: No  . Sexual activity: Yes  Other Topics Concern  . Not on file  Social History Narrative  . Not on file   Social Determinants of Health   Financial Resource Strain: Low Risk   . Difficulty of Paying Living Expenses: Not very hard  Food Insecurity: No Food Insecurity  . Worried About Charity fundraiser in the Last Year: Never true  . Ran Out of Food in the Last Year: Never true  Transportation Needs: Unmet Transportation Needs  . Lack of Transportation  (Medical): Yes  . Lack of Transportation (Non-Medical): Yes  Physical Activity:   . Days of Exercise per Week: Not on file  . Minutes of Exercise per Session: Not on file  Stress:   . Feeling of Stress : Not on file  Social Connections:   . Frequency of Communication with Friends and Family: Not on file  . Frequency of Social Gatherings with Friends and Family: Not on file  . Attends Religious Services: Not on file  . Active Member of Clubs or Organizations: Not on file  . Attends Archivist Meetings: Not on file  . Marital Status: Not on file  Intimate Partner Violence:   . Fear of Current or Ex-Partner: Not on file  . Emotionally Abused: Not on file  . Physically Abused: Not on file  . Sexually Abused: Not on file    Physical Exam   Arrived for home visit for [facility] who stated he was not feeling too good. [name] explained he drank a lot of liquor and beer over the weekend and has had some upset stomach and fatigue since then. I explained to [name] he needed to hydrate and eat some healthy meals and avoid alcohol. [name] expressed understanding and agreed. Vitals were obtained. Medications were reviewed and confirmed. Pill box was filled. [name] agreed to home visit in one week. Patient explained he wanted to cancel his 4:00 appointment with Dr. Radford Pax today. Same was rescheduled for 4/19 at 0800. Home visit complete.    Refills:  Seroquel   CBG- 305 (insulin taken 5 mins prior)    Future Appointments  Date Time Provider [place]  02/05/2020 10:30 AM [facility] PA/NP [facility] None  03/15/2020  8:00 AM [name], MD [facility]  04/09/2020  8:00 AM Azzie Glatter, FNP [facility] None     ACTION: Home visit completed Next visit planned for one week

## 2020-02-05 ENCOUNTER — Other Ambulatory Visit (HOSPITAL_COMMUNITY): Payer: Self-pay

## 2020-02-05 ENCOUNTER — Encounter (HOSPITAL_COMMUNITY): Payer: Self-pay

## 2020-02-05 ENCOUNTER — Ambulatory Visit (HOSPITAL_COMMUNITY)
Admission: RE | Admit: 2020-02-05 | Discharge: 2020-02-05 | Disposition: A | Discharge status: Home / Self Care | Payer: Medicare Other | Source: Ambulatory Visit | Attending: Adult Health | Admitting: Adult Health

## 2020-02-05 ENCOUNTER — Other Ambulatory Visit: Payer: Self-pay

## 2020-02-05 VITALS — BP 108/84 | HR 64 | Wt 301.8 lb

## 2020-02-05 DIAGNOSIS — Z8249 Family history of ischemic heart disease and other diseases of the circulatory system: Secondary | ICD-10-CM | POA: Diagnosis not present

## 2020-02-05 DIAGNOSIS — I5022 Chronic systolic (congestive) heart failure: Secondary | ICD-10-CM | POA: Diagnosis not present

## 2020-02-05 DIAGNOSIS — I10 Essential (primary) hypertension: Secondary | ICD-10-CM | POA: Diagnosis not present

## 2020-02-05 DIAGNOSIS — Z794 Long term (current) use of insulin: Secondary | ICD-10-CM | POA: Diagnosis not present

## 2020-02-05 DIAGNOSIS — E119 Type 2 diabetes mellitus without complications: Secondary | ICD-10-CM | POA: Insufficient documentation

## 2020-02-05 DIAGNOSIS — Z87891 Personal history of nicotine dependence: Secondary | ICD-10-CM | POA: Diagnosis not present

## 2020-02-05 DIAGNOSIS — Z9989 Dependence on other enabling machines and devices: Secondary | ICD-10-CM | POA: Diagnosis not present

## 2020-02-05 DIAGNOSIS — I2510 Atherosclerotic heart disease of native coronary artery without angina pectoris: Secondary | ICD-10-CM | POA: Diagnosis not present

## 2020-02-05 DIAGNOSIS — Z79899 Other long term (current) drug therapy: Secondary | ICD-10-CM | POA: Insufficient documentation

## 2020-02-05 DIAGNOSIS — F209 Schizophrenia, unspecified: Secondary | ICD-10-CM | POA: Diagnosis not present

## 2020-02-05 DIAGNOSIS — G4733 Obstructive sleep apnea (adult) (pediatric): Secondary | ICD-10-CM | POA: Insufficient documentation

## 2020-02-05 DIAGNOSIS — Z955 Presence of coronary angioplasty implant and graft: Secondary | ICD-10-CM | POA: Insufficient documentation

## 2020-02-05 DIAGNOSIS — I110 Hypertensive heart disease with heart failure: Secondary | ICD-10-CM | POA: Diagnosis not present

## 2020-02-05 DIAGNOSIS — I480 Paroxysmal atrial fibrillation: Secondary | ICD-10-CM | POA: Insufficient documentation

## 2020-02-05 DIAGNOSIS — I428 Other cardiomyopathies: Secondary | ICD-10-CM | POA: Insufficient documentation

## 2020-02-05 DIAGNOSIS — Z7901 Long term (current) use of anticoagulants: Secondary | ICD-10-CM | POA: Diagnosis not present

## 2020-02-05 DIAGNOSIS — E785 Hyperlipidemia, unspecified: Secondary | ICD-10-CM | POA: Insufficient documentation

## 2020-02-05 LAB — BASIC METABOLIC PANEL
Anion gap: 8 (ref 5–15)
BUN: 20 mg/dL (ref 6–20)
CO2: 25 mmol/L (ref 22–32)
Calcium: 8.6 mg/dL — ABNORMAL LOW (ref 8.9–10.3)
Chloride: 105 mmol/L (ref 98–111)
Creatinine, Ser: 1.34 mg/dL — ABNORMAL HIGH (ref 0.61–1.24)
GFR calc Af Amer: >60 mL/min (ref >60)
GFR calc non Af Amer: 58 mL/min — ABNORMAL LOW (ref >60)
Glucose, Bld: 126 mg/dL — ABNORMAL HIGH (ref 70–99)
Potassium: 4.2 mmol/L (ref 3.5–5.1)
Sodium: 138 mmol/L (ref 135–145)

## 2020-02-05 MED ORDER — CARVEDILOL 12.5 MG PO TABS: 12.5000 mg | ORAL_TABLET | Freq: Two times a day (BID) | ORAL | 2 refills | Status: DC

## 2020-02-05 MED ORDER — CARVEDILOL 6.25 MG PO TABS: 12.5000 mg | ORAL_TABLET | Freq: Two times a day (BID) | ORAL | 1 refills | Status: DC

## 2020-02-05 MED FILL — ?CARVEDILOL 12.5 MG TABLET: 12.5 | 30 days supply | Qty: 60 | Fill #0

## 2020-02-05 NOTE — Progress Notes (Signed)
Met with [name] in the clinic today, who reports feeling well today. Patient has been compliant with his medications. Patient stated he feels like he is gaining weight, noted weight to be up by 7lbs this week. Patient reports he is beginning to exercise this week at the [facility]. [name] saw patient in clinic and stated she will up Carvedilol to 12.5 twice a day. Weigh daily. No other med changes. I will follow up with patient in one week.

## 2020-02-05 NOTE — Progress Notes (Signed)
PCP: Nolene Ebbs, MD Cardiology: Dr. Radford Pax HF Cardiology: Dr. Aundra Dubin  He can write his name only. He is only able to read a few words. Thinks he completed the 7th grade.   59 yo with history of chronic systolic CHF, CAD, type 2 diabetes, paroxysmal atrial fibrillation, and schizophrenia was referred by Dr. Radford Pax for evaluation of CHF.  Patient had OM2 PCI in 2012.  In 11/20, he was admitted with CHF. Echo showed EF 25-30% with diffuse hypokinesis.  LHC was done, showing occluded OM2 at prior stent, 90% D1 stenosis, and extensive diffuse RCA disease.  No intervention.  He was thought to be in paroxysmal atrial fibrillation during this appointment and apixaban was started.   He does not smoke, rarely drinks, and does not use drugs.  His mother had "heart problems."    Today he returns for HF follow up. Last visit coreg was increased and entresto was restarted. SBP at home has been lower -->122/66.   Overall feeling fine. Mild SOB with exertion.  No chest pain. Denies PND/Orthopnea.No chest pain.  Appetite ok. Drinking gatorade last week.  No fever or chills. Not weighing at home. Had 20 beers this past weekend. Taking all medications and has not missed any doses.  Followed by HF Paramedicine.   Social History --->Says he can read just a little bit. He can write his name only. Says he has a hard time reading medication labels. He doesn't know how to take a 1/2 tablet. Has transportation issues.  Lives with his sister.   Labs (1/21): LDL 66, HDL 42, hgb 11.3, K 4.7, creatinine 1.32  ECG (12/20): NSR, PACs, lateral TWIs concerning for ischemia.   PMH:  1. Atrial fibrillation: Paroxysmal 2. Type 2 diabetes 3. HTN 4. Hyperlipidemia 5. Schizophrenia 6. CAD: PCI OM2 in 2012.  - LHC (11/20): 90% D1 stenosis, totally occluded OM2 at stent, serial 85%/70%/60% RCA stenoses.  7. 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   - Echo (11/20): EF 25-30%, global hypokinesis.     Social History   Socioeconomic History  . Marital status: Legally Separated    Spouse name: Not on file  . Number of children: Not on file  . Years of education: Not on file  . Highest education level: Not on file  Occupational History  . Not on file  Tobacco Use  . 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 Smokeless tobacco: Never Used  Substance and Sexual Activity  . Alcohol use: No  . Drug use: No  . Sexual activity: Yes  Other Topics Concern  . Not on file  Social History Narrative  . Not on file   Social Determinants of Health   Financial Resource Strain: Low Risk   . Difficulty of Paying Living Expenses: Not very hard  Food Insecurity: No Food Insecurity  . Worried About Charity fundraiser in the Last Year: Never true  . Ran Out of Food in the Last Year: Never true  Transportation Needs: Unmet Transportation Needs  . Lack of Transportation (Medical): Yes  . Lack of Transportation (Non-Medical): Yes  Physical Activity:   . Days of Exercise per Week:   . Minutes of Exercise per Session:   Stress:   . Feeling of Stress :   Social Connections:   . Frequency of Communication with Friends and Family:   . Frequency of Social Gatherings with Friends and Family:   . Attends Religious Services:   . Active Member of Clubs or Organizations:   .  Attends Archivist Meetings:   [name] Kitchen Marital Status:   Intimate Partner Violence:   . Fear of Current or Ex-Partner:   . Emotionally Abused:   [name] Kitchen Physically Abused:   . Sexually Abused:    Family History  Problem Relation Age of Onset  . Heart failure Mother   . Mental illness Sister   . Mental illness Sister    ROS: All systems reviewed and negative except as per HPI.  Current Meds  Medication Sig  . apixaban (ELIQUIS) 5 MG TABS tablet Take 1 tablet (5 mg total) by mouth 2 (two) times daily.  [name] Kitchen atorvastatin (LIPITOR) 40 MG tablet Take 1 tablet (40 mg total) by mouth daily at 6 PM.  . blood glucose meter kit and  supplies KIT Dispense based on patient and insurance preference. Use up to four times daily as directed. (FOR ICD-9 250.00, 250.01).  . carvedilol (COREG) 6.25 MG tablet Take 1.5 tablets (9.375 mg total) by mouth 2 (two) times daily with a meal.  . cyclobenzaprine (FLEXERIL) 10 MG tablet Take 1 tablet (10 mg total) by mouth 3 (three) times daily as needed for muscle spasms.  . furosemide (LASIX) 40 MG tablet Take 1 tablet (40 mg total) by mouth 2 (two) times daily.  [name] Kitchen gabapentin (NEURONTIN) 300 MG capsule Take 2 capsules (600 mg = total), by mouth, 2 times daily.  . Insulin Aspart Prot & Aspart (INSULIN ASP PROT & ASP FLEXPEN) (70-30) 100 UNIT/ML SUPN Inject 30 Units into the skin 2 (two) times daily.  . potassium chloride SA (KLOR-CON) 20 MEQ tablet Take 1 tablet (20 mEq total) by mouth 2 (two) times daily.  . QUEtiapine (SEROQUEL) 100 MG tablet Take 1 tablet (100 mg total) by mouth at bedtime.  . sacubitril-valsartan (ENTRESTO) 49-51 MG Take 1 tablet by mouth 2 (two) times daily.  . sildenafil (VIAGRA) 100 MG tablet Take 1 tablet (100 mg total) by mouth daily as needed for erectile dysfunction.  [name] Kitchen spironolactone (ALDACTONE) 25 MG tablet Take 1 tablet (25 mg total) by mouth daily. (Patient taking differently: Take 25 mg by mouth at bedtime. )  . Turmeric 500 MG CAPS Take by mouth.   BP 108/84   Pulse 64   Wt (!) 136.9 kg (301 lb 12.8 oz)   SpO2 95%   BMI 38.75 kg/m   Wt Readings from Last 3 Encounters:  02/05/20 (!) 136.9 kg (301 lb 12.8 oz)  01/27/20 133.4 kg (294 lb)  01/22/20 (!) 136.4 kg (300 lb 9.6 oz)   .med Vitals:   02/05/20 1043  BP: 108/84  Pulse: 64  SpO2: 95%   General:  Well appearing. No resp difficulty HEENT: normal Neck: supple. no JVD. Carotids 2+ bilat; no bruits. No lymphadenopathy or thryomegaly appreciated. Cor: PMI nondisplaced. Regular rate & rhythm. No rubs, gallops or murmurs. Lungs: clear Abdomen: soft, nontender, nondistended. No hepatosplenomegaly. No  bruits or masses. Good bowel sounds. Extremities: no cyanosis, clubbing, rash, edema Neuro: alert & orientedx3, cranial nerves grossly intact. moves all 4 extremities w/o difficulty. Affect pleasant  Assessment/Plan: 1. Chronic systolic CHF: Echo in 95/62 with EF 25-30%.  Suspect mixed ischemic/nonischemic cardiomyopathy.   - We need to keep medication regimen as simple as possible. He has low literacy.  NYHA III. Volume status stable. Continue lasix to 40 mg twice a day.  -Continue entresto  49-51 mg twice a day today  - Increase coreg 12.5 mg twice a day.   - Change spiro to 25 mg  daily.  - No SGLTi with A1C 10.4. - Instructed to weigh everyday and write it down. - Dicussed limiting alcohol intake.  - Repeat echo at next visit.  Narrow QRS so not CRT candidate.   2. CAD: Last cath in 11/20 with occluded OM2 stent, 90% D1, severe diffuse disease in the RCA (no intervention). No chest pain.  - No ASA given apixaban use.  - Continue atorvastatin. Good lipids in 1/21.   3. Atrial fibrillation: Paroxysmal.   - Regular on exam.  - No bleeding issues.  - Continue apixaban 5 mg bid.   4. DMII -On insulin.  - Last Hgb A1C 10.4 . Check in 3 months.  - He has follow up with PCP.   5. OSA Continue CPAP. Continue CPAP every night.    6. HTN Better controlled.   Follow up in 6 weeks with Dr Aundra Dubin and an ECHO. Check BMET.  Discussed medications changes with Mr Nakamura and HF Paramedic.       Dragan Tamburrino NP-C  02/05/2020

## 2020-02-05 NOTE — Patient Instructions (Signed)
Lab work done today. We will notify you of any abnormal lab work. No news is good news!  INCREASE Carvedilol to 12.5mg  tab two times daily.  Your physician has requested that you have an echocardiogram. Echocardiography is a painless test that uses sound waves to create images of your heart. It provides your doctor with information about the size and shape of your heart and how well your heart's chambers and valves are working. This procedure takes approximately one hour. There are no restrictions for this procedure. This will be done at your follow up appointment.  Please follow up with the [facility] in 6 weeks.  At the [facility], you and your health needs are our priority. As part of our continuing mission to provide you with exceptional heart care, we have created designated Provider Care Teams. These Care Teams include your primary Cardiologist (physician) and Advanced Practice Providers (APPs- Physician Assistants and Nurse Practitioners) who all work together to provide you with the care you need, when you need it.   You may see any of the following providers on your designated Care Team at your next follow up: [name] Kitchen Dr Glori Bickers . Dr Loralie Champagne . Darrick Grinder, NP . Lyda Jester, PA . [name], PharmD   Please be sure to bring in all your medications bottles to every appointment.

## 2020-02-06 MED FILL — QUETIAPINE FUMARATE 200 MG: 200 | 30 days supply | Qty: 30 | Fill #0

## 2020-02-09 ENCOUNTER — Other Ambulatory Visit (HOSPITAL_COMMUNITY): Payer: Self-pay

## 2020-02-09 NOTE — Progress Notes (Signed)
Paramedicine Encounter    Patient ID: [name], male    DOB: 1961/09/21, 59 y.o.   MRN: [id number]   Patient Care Team: Nolene Ebbs, MD as PCP - General (Internal Medicine) [name], MD as PCP - Cardiology (Cardiology)  Patient Active Problem List   Diagnosis Date Noted  . Type 2 diabetes mellitus with hyperglycemia, with long-term current use of insulin ([name]) 12/03/2019  . Diabetic polyneuropathy associated with type 2 diabetes mellitus ([name]) 12/03/2019  . Hyperglycemia 12/03/2019  . History of hyperglycemia 12/03/2019  . Erectile dysfunction 12/03/2019  . CHF (congestive heart failure) ([name]) 10/11/2019  . Paranoid schizophrenia, chronic condition ([name]) 01/26/2015  . Severe recurrent major depressive disorder with psychotic features ([name]) 01/26/2015  . GAD (generalized anxiety disorder) 01/26/2015  . OCD (obsessive compulsive disorder) 01/26/2015  . Panic disorder without agoraphobia 01/26/2015  . Insomnia 01/26/2015    Current Outpatient Medications:  .  apixaban (ELIQUIS) 5 MG TABS tablet, Take 1 tablet (5 mg total) by mouth 2 (two) times daily., Disp: 180 tablet, Rfl: 3 .  atorvastatin (LIPITOR) 40 MG tablet, Take 1 tablet (40 mg total) by mouth daily at 6 PM., Disp: 90 tablet, Rfl: 3 .  blood glucose meter kit and supplies KIT, Dispense based on patient and insurance preference. Use up to four times daily as directed. (FOR ICD-9 250.00, 250.01)., Disp: 1 each, Rfl: 0 .  carvedilol (COREG) 12.5 MG tablet, Take 1 tablet (12.5 mg total) by mouth 2 (two) times daily with a meal., Disp: 60 tablet, Rfl: 2 .  furosemide (LASIX) 40 MG tablet, Take 1 tablet (40 mg total) by mouth 2 (two) times daily., Disp: 60 tablet, Rfl: 11 .  gabapentin (NEURONTIN) 300 MG capsule, Take 2 capsules (600 mg = total), by mouth, 2 times daily., Disp: 120 capsule, Rfl: 3 .  Insulin Aspart Prot & Aspart (INSULIN ASP PROT & ASP FLEXPEN) (70-30) 100 UNIT/ML SUPN, Inject 30 Units into the  skin 2 (two) times daily., Disp: 3 mL, Rfl: 12 .  potassium chloride SA (KLOR-CON) 20 MEQ tablet, Take 1 tablet (20 mEq total) by mouth 2 (two) times daily., Disp: 60 tablet, Rfl: 6 .  QUEtiapine (SEROQUEL) 100 MG tablet, Take 1 tablet (100 mg total) by mouth at bedtime., Disp: 30 tablet, Rfl: 6 .  sacubitril-valsartan (ENTRESTO) 49-51 MG, Take 1 tablet by mouth 2 (two) times daily., Disp: 60 tablet, Rfl: 6 .  spironolactone (ALDACTONE) 25 MG tablet, Take 1 tablet (25 mg total) by mouth daily. (Patient taking differently: Take 25 mg by mouth at bedtime. ), Disp: 30 tablet, Rfl: 11 .  Turmeric 500 MG CAPS, Take by mouth., Disp: , Rfl:  .  BLACK CURRANT SEED OIL PO, Take by mouth daily. 1 tsp every day, Disp: , Rfl:  .  cyclobenzaprine (FLEXERIL) 10 MG tablet, Take 1 tablet (10 mg total) by mouth 3 (three) times daily as needed for muscle spasms. (Patient not taking: Reported on 02/09/2020), Disp: 30 tablet, Rfl: 3 .  sildenafil (VIAGRA) 100 MG tablet, Take 1 tablet (100 mg total) by mouth daily as needed for erectile dysfunction. (Patient not taking: Reported on 02/09/2020), Disp: 10 tablet, Rfl: 1 No Known Allergies   Social History   Socioeconomic History  . Marital status: Legally Separated    Spouse name: Not on file  . Number of children: Not on file  . Years of education: Not on file  . Highest education level: Not on file  Occupational History  .  Not on file  Tobacco Use  . 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 Smokeless tobacco: Never Used  Substance and Sexual Activity  . Alcohol use: No  . Drug use: No  . Sexual activity: Yes  Other Topics Concern  . Not on file  Social History Narrative  . Not on file   Social Determinants of Health   Financial Resource Strain: Low Risk   . Difficulty of Paying Living Expenses: Not very hard  Food Insecurity: No Food Insecurity  . Worried About Charity fundraiser in the Last Year: Never true  . Ran Out of Food in the Last Year: Never true   Transportation Needs: Unmet Transportation Needs  . Lack of Transportation (Medical): Yes  . Lack of Transportation (Non-Medical): Yes  Physical Activity:   . Days of Exercise per Week:   . Minutes of Exercise per Session:   Stress:   . Feeling of Stress :   Social Connections:   . Frequency of Communication with Friends and Family:   . Frequency of Social Gatherings with Friends and Family:   . Attends Religious Services:   . Active Member of Clubs or Organizations:   . Attends Archivist Meetings:   [name] Kitchen Marital Status:   Intimate Partner Violence:   . Fear of Current or Ex-Partner:   . Emotionally Abused:   [name] Kitchen Physically Abused:   . Sexually Abused:     Physical Exam Constitutional:      Appearance: He is normal weight.  HENT:     Head: Normocephalic.     Nose: Nose normal.     Mouth/Throat:     Mouth: Mucous membranes are moist.  Eyes:     Pupils: Pupils are equal, round, and reactive to light.  Cardiovascular:     Rate and Rhythm: Normal rate and regular rhythm.     Pulses: Normal pulses.     Heart sounds: Normal heart sounds.  Pulmonary:     Effort: Pulmonary effort is normal.     Breath sounds: Normal breath sounds.  Abdominal:     Palpations: Abdomen is soft.  Musculoskeletal:        General: Normal range of motion.     Cervical back: Normal range of motion.     Right lower leg: No edema.     Left lower leg: No edema.  Skin:    General: Skin is warm and dry.     Capillary Refill: Capillary refill takes less than 2 seconds.  Neurological:     Mental Status: He is alert. Mental status is at baseline.  Psychiatric:        Mood and Affect: Mood normal.      Arrived for home visit for [facility] who was alert and oriented with no complaints stating he feels good today. [name] was compliant with medications this week. [name] meds were verified and confirmed. Pill box filled. Vitals obtained. Patient down 9 pounds since last visit. Patient stated he has been  avoiding alcohol and walking. I encouraged patient to continue same. [name] was given weight chart for him to use for this week and today's weight and CBG were documented. [name] agreed with visit in one week.     CBG- 270  Refills- Atorvastatin   Future Appointments  Date Time Provider [place]  03/15/2020  8:00 AM [name], MD [facility]  03/26/2020 10:15 AM MC ECHO OP 2 [facility]  03/26/2020 11:20 AM [name], MD [facility]  None  04/09/2020  8:00 AM Azzie Glatter, FNP [facility] None     ACTION: Home visit completed Next visit planned for one week

## 2020-02-10 MED FILL — ?ATORVASTATIN 40MG TABLET: 40 | 30 days supply | Qty: 30 | Fill #3

## 2020-02-16 ENCOUNTER — Other Ambulatory Visit (HOSPITAL_COMMUNITY): Payer: Self-pay

## 2020-02-16 MED FILL — QUETIAPINE FUMARATE 100 MG: 100 | 30 days supply | Qty: 30 | Fill #3

## 2020-02-16 NOTE — Progress Notes (Signed)
Home visit for [name] who was alert and oriented sitting in his living room with no complaints. [name] reports no shortness of breath, dizziness, chest pain or trouble sleeping. [name] stated he has been eating more than normal this last week due to cooking more often. I encouraged him to be sure he is eating appropriate foods and exercising. Patient agreed. Vitals obtained. Pills confirmed and reviewed. Pill box filled. No edema noted. Lungs clear.   Refills: Potassium Lasix Viagra Carvedilol Mearl Latin

## 2020-02-17 ENCOUNTER — Other Ambulatory Visit: Payer: Self-pay | Admitting: Family Medicine

## 2020-02-17 DIAGNOSIS — E1165 Type 2 diabetes mellitus with hyperglycemia: Secondary | ICD-10-CM

## 2020-02-17 MED FILL — FUROSEMIDE 40 MG TAB: 40 | 30 days supply | Qty: 60 | Fill #3

## 2020-02-17 MED FILL — POTASSIUM CL ER 20 MEQ TABL: 20 | 30 days supply | Qty: 60 | Fill #2

## 2020-02-17 MED FILL — SILDENAFIL CITRATE 100 MG T: 100 | 30 days supply | Qty: 4 | Fill #2 | Status: TO

## 2020-02-17 MED FILL — SPIRONOLACTONE 25 MG TABLET: 25 | 30 days supply | Qty: 30 | Fill #2 | Status: TO

## 2020-02-17 MED FILL — CARVEDILOL 6.25 MG TABLET: 6.25 | 30 days supply | Qty: 60 | Fill #2 | Status: TO

## 2020-02-18 ENCOUNTER — Other Ambulatory Visit: Payer: Self-pay | Admitting: Family Medicine

## 2020-02-18 DIAGNOSIS — R7309 Other abnormal glucose: Secondary | ICD-10-CM

## 2020-02-18 DIAGNOSIS — E1165 Type 2 diabetes mellitus with hyperglycemia: Secondary | ICD-10-CM

## 2020-02-18 MED ORDER — INSULIN LISPRO PROT & LISPRO (75-25 MIX) 100 UNIT/ML KWIKPEN: 30.0000 [IU] | PEN_INJECTOR | Freq: Two times a day (BID) | SUBCUTANEOUS | 11 refills | Status: DC

## 2020-02-19 MED FILL — HUMALOG MIX 75-25 KWIKPEN: (75-25) 100 | 25 days supply | Qty: 15 | Fill #0 | Status: TO

## 2020-02-23 ENCOUNTER — Other Ambulatory Visit (HOSPITAL_COMMUNITY): Payer: Self-pay

## 2020-02-23 NOTE — Progress Notes (Signed)
Paramedicine Encounter    Patient ID: [name], male    DOB: 02-18-1961, 59 y.o.   MRN: [id number]   Patient Care Team: Nolene Ebbs, MD as PCP - General (Internal Medicine) [name], MD as PCP - Cardiology (Cardiology)  Patient Active Problem List   Diagnosis Date Noted  . Type 2 diabetes mellitus with hyperglycemia, with long-term current use of insulin ([place]) 12/03/2019  . Diabetic polyneuropathy associated with type 2 diabetes mellitus ([place]) 12/03/2019  . Hyperglycemia 12/03/2019  . History of hyperglycemia 12/03/2019  . Erectile dysfunction 12/03/2019  . CHF (congestive heart failure) ([place]) 10/11/2019  . Paranoid schizophrenia, chronic condition ([place]) 01/26/2015  . Severe recurrent major depressive disorder with psychotic features ([place]) 01/26/2015  . GAD (generalized anxiety disorder) 01/26/2015  . OCD (obsessive compulsive disorder) 01/26/2015  . Panic disorder without agoraphobia 01/26/2015  . Insomnia 01/26/2015    Current Outpatient Medications:  .  apixaban (ELIQUIS) 5 MG TABS tablet, Take 1 tablet (5 mg total) by mouth 2 (two) times daily., Disp: 180 tablet, Rfl: 3 .  atorvastatin (LIPITOR) 40 MG tablet, Take 1 tablet (40 mg total) by mouth daily at 6 PM., Disp: 90 tablet, Rfl: 3 .  blood glucose meter kit and supplies KIT, Dispense based on patient and insurance preference. Use up to four times daily as directed. (FOR ICD-9 250.00, 250.01)., Disp: 1 each, Rfl: 0 .  carvedilol (COREG) 12.5 MG tablet, Take 1 tablet (12.5 mg total) by mouth 2 (two) times daily with a meal., Disp: 60 tablet, Rfl: 2 .  furosemide (LASIX) 40 MG tablet, Take 1 tablet (40 mg total) by mouth 2 (two) times daily., Disp: 60 tablet, Rfl: 11 .  Insulin Lispro Prot & Lispro (HUMALOG MIX 75/25 KWIKPEN) (75-25) 100 UNIT/ML Kwikpen, Inject 30 Units into the skin 2 (two) times daily., Disp: 15 mL, Rfl: 11 .  NOVOLOG MIX 70/30 FLEXPEN (70-30) 100 UNIT/ML FlexPen, INJECT 30 UNITS INTO THE  SKIN 2 (TWO) TIMES DAILY., Disp: 9 mL, Rfl: 12 .  potassium chloride SA (KLOR-CON) 20 MEQ tablet, Take 1 tablet (20 mEq total) by mouth 2 (two) times daily., Disp: 60 tablet, Rfl: 6 .  QUEtiapine (SEROQUEL) 100 MG tablet, Take 1 tablet (100 mg total) by mouth at bedtime., Disp: 30 tablet, Rfl: 6 .  sacubitril-valsartan (ENTRESTO) 49-51 MG, Take 1 tablet by mouth 2 (two) times daily., Disp: 60 tablet, Rfl: 6 .  spironolactone (ALDACTONE) 25 MG tablet, Take 1 tablet (25 mg total) by mouth daily. (Patient taking differently: Take 25 mg by mouth at bedtime. ), Disp: 30 tablet, Rfl: 11 .  Turmeric 500 MG CAPS, Take by mouth., Disp: , Rfl:  .  BLACK CURRANT SEED OIL PO, Take by mouth daily. 1 tsp every day, Disp: , Rfl:  .  cyclobenzaprine (FLEXERIL) 10 MG tablet, Take 1 tablet (10 mg total) by mouth 3 (three) times daily as needed for muscle spasms. (Patient not taking: Reported on 02/09/2020), Disp: 30 tablet, Rfl: 3 .  gabapentin (NEURONTIN) 300 MG capsule, Take 2 capsules (600 mg = total), by mouth, 2 times daily., Disp: 120 capsule, Rfl: 3 .  sildenafil (VIAGRA) 100 MG tablet, Take 1 tablet (100 mg total) by mouth daily as needed for erectile dysfunction. (Patient not taking: Reported on 02/23/2020), Disp: 10 tablet, Rfl: 1 No Known Allergies   Social History   Socioeconomic History  . Marital status: Legally Separated    Spouse name: Not on file  . Number of children:  Not on file  . Years of education: Not on file  . Highest education level: Not on file  Occupational History  . Not on file  Tobacco Use  . 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 Smokeless tobacco: Never Used  Substance and Sexual Activity  . Alcohol use: No  . Drug use: No  . Sexual activity: Yes  Other Topics Concern  . Not on file  Social History Narrative  . Not on file   Social Determinants of Health   Financial Resource Strain: Low Risk   . Difficulty of Paying Living Expenses: Not very hard  Food Insecurity: No  Food Insecurity  . Worried About Charity fundraiser in the Last Year: Never true  . Ran Out of Food in the Last Year: Never true  Transportation Needs: Unmet Transportation Needs  . Lack of Transportation (Medical): Yes  . Lack of Transportation (Non-Medical): Yes  Physical Activity:   . Days of Exercise per Week:   . Minutes of Exercise per Session:   Stress:   . Feeling of Stress :   Social Connections:   . Frequency of Communication with Friends and Family:   . Frequency of Social Gatherings with Friends and Family:   . Attends Religious Services:   . Active Member of Clubs or Organizations:   . Attends Archivist Meetings:   [name] Kitchen Marital Status:   Intimate Partner Violence:   . Fear of Current or Ex-Partner:   . Emotionally Abused:   [name] Kitchen Physically Abused:   . Sexually Abused:     Physical Exam Vitals reviewed.  Constitutional:      Appearance: He is normal weight.  HENT:     Head: Normocephalic.     Nose: Nose normal.     Mouth/Throat:     Mouth: Mucous membranes are moist.     Pharynx: Oropharynx is clear.  Eyes:     Pupils: Pupils are equal, round, and reactive to light.  Cardiovascular:     Rate and Rhythm: Normal rate and regular rhythm.     Pulses: Normal pulses.     Heart sounds: Normal heart sounds.  Pulmonary:     Effort: Pulmonary effort is normal.     Breath sounds: Normal breath sounds.  Abdominal:     Palpations: Abdomen is soft.  Musculoskeletal:        General: Normal range of motion.     Cervical back: Normal range of motion.     Right lower leg: No edema.     Left lower leg: No edema.  Skin:    General: Skin is warm and dry.     Capillary Refill: Capillary refill takes less than 2 seconds.  Neurological:     Mental Status: He is alert. Mental status is at baseline.  Psychiatric:        Mood and Affect: Mood normal.    Arrived for home visit for [facility] who was seated on his couch, alert and oriented with no complaints. [name] denies  increased shortness of breath over the last week or leg swelling. No edema noted. Lung sounds clear and equal. [name] reports cooking a lot of food over the last week at home and eating more than he normally does. I advised [name] is was best to try to reduce the amount of food he eats and decrease liquid intake. He understood and verbalized same. Vitals were obtained and are as noted. [name] was compliant with medications only missing one morning dose  of medications. Medications reviewed and confirmed. Pill box filled accordingly. [name] reports he got his silver sneaker card and he is going to start trying to work out once he gets some gym clothes. I will be asking around for donations for same. Home visit complete and [name] agreed to visit in one week.    Refills: Eliquis Gabapentin   CBG- 198        Future Appointments  Date Time Provider [place]  03/15/2020  8:00 AM [name], MD [facility]  03/26/2020 10:15 AM MC ECHO OP 2 [facility]  03/26/2020 11:20 AM [name], MD [facility] None  04/09/2020  8:00 AM Azzie Glatter, FNP [facility] None     ACTION: Home visit completed Next visit planned for one week

## 2020-03-01 ENCOUNTER — Ambulatory Visit: Payer: Self-pay | Admitting: Family Medicine

## 2020-03-02 ENCOUNTER — Other Ambulatory Visit (HOSPITAL_COMMUNITY): Payer: Self-pay

## 2020-03-02 NOTE — Progress Notes (Signed)
Paramedicine Encounter    Patient ID: [name], male    DOB: 06/20/1961, 59 y.o.   MRN: [id number]   Patient Care Team: Nolene Ebbs, MD as PCP - General (Internal Medicine) [name], MD as PCP - Cardiology (Cardiology)  Patient Active Problem List   Diagnosis Date Noted  . Type 2 diabetes mellitus with hyperglycemia, with long-term current use of insulin ([place]) 12/03/2019  . Diabetic polyneuropathy associated with type 2 diabetes mellitus ([place]) 12/03/2019  . Hyperglycemia 12/03/2019  . History of hyperglycemia 12/03/2019  . Erectile dysfunction 12/03/2019  . CHF (congestive heart failure) ([place]) 10/11/2019  . Paranoid schizophrenia, chronic condition ([place]) 01/26/2015  . Severe recurrent major depressive disorder with psychotic features ([place]) 01/26/2015  . GAD (generalized anxiety disorder) 01/26/2015  . OCD (obsessive compulsive disorder) 01/26/2015  . Panic disorder without agoraphobia 01/26/2015  . Insomnia 01/26/2015    Current Outpatient Medications:  .  apixaban (ELIQUIS) 5 MG TABS tablet, Take 1 tablet (5 mg total) by mouth 2 (two) times daily., Disp: 180 tablet, Rfl: 3 .  atorvastatin (LIPITOR) 40 MG tablet, Take 1 tablet (40 mg total) by mouth daily at 6 PM., Disp: 90 tablet, Rfl: 3 .  BLACK CURRANT SEED OIL PO, Take by mouth daily. 1 tsp every day, Disp: , Rfl:  .  blood glucose meter kit and supplies KIT, Dispense based on patient and insurance preference. Use up to four times daily as directed. (FOR ICD-9 250.00, 250.01)., Disp: 1 each, Rfl: 0 .  carvedilol (COREG) 12.5 MG tablet, Take 1 tablet (12.5 mg total) by mouth 2 (two) times daily with a meal., Disp: 60 tablet, Rfl: 2 .  cyclobenzaprine (FLEXERIL) 10 MG tablet, Take 1 tablet (10 mg total) by mouth 3 (three) times daily as needed for muscle spasms., Disp: 30 tablet, Rfl: 3 .  furosemide (LASIX) 40 MG tablet, Take 1 tablet (40 mg total) by mouth 2 (two) times daily., Disp: 60 tablet, Rfl: 11 .   gabapentin (NEURONTIN) 300 MG capsule, Take 2 capsules (600 mg = total), by mouth, 2 times daily., Disp: 120 capsule, Rfl: 3 .  Insulin Lispro Prot & Lispro (HUMALOG MIX 75/25 KWIKPEN) (75-25) 100 UNIT/ML Kwikpen, Inject 30 Units into the skin 2 (two) times daily., Disp: 15 mL, Rfl: 11 .  NOVOLOG MIX 70/30 FLEXPEN (70-30) 100 UNIT/ML FlexPen, INJECT 30 UNITS INTO THE SKIN 2 (TWO) TIMES DAILY., Disp: 9 mL, Rfl: 12 .  potassium chloride SA (KLOR-CON) 20 MEQ tablet, Take 1 tablet (20 mEq total) by mouth 2 (two) times daily., Disp: 60 tablet, Rfl: 6 .  QUEtiapine (SEROQUEL) 100 MG tablet, Take 1 tablet (100 mg total) by mouth at bedtime., Disp: 30 tablet, Rfl: 6 .  sacubitril-valsartan (ENTRESTO) 49-51 MG, Take 1 tablet by mouth 2 (two) times daily., Disp: 60 tablet, Rfl: 6 .  spironolactone (ALDACTONE) 25 MG tablet, Take 1 tablet (25 mg total) by mouth daily. (Patient taking differently: Take 25 mg by mouth at bedtime. ), Disp: 30 tablet, Rfl: 11 .  Turmeric 500 MG CAPS, Take by mouth., Disp: , Rfl:  .  sildenafil (VIAGRA) 100 MG tablet, Take 1 tablet (100 mg total) by mouth daily as needed for erectile dysfunction. (Patient not taking: Reported on 02/23/2020), Disp: 10 tablet, Rfl: 1 No Known Allergies   Social History   Socioeconomic History  . Marital status: Legally Separated    Spouse name: Not on file  . Number of children: Not on file  . Years  of education: Not on file  . Highest education level: Not on file  Occupational History  . Not on file  Tobacco Use  . 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 Smokeless tobacco: Never Used  Substance and Sexual Activity  . Alcohol use: No  . Drug use: No  . Sexual activity: Yes  Other Topics Concern  . Not on file  Social History Narrative  . Not on file   Social Determinants of Health   Financial Resource Strain: Low Risk   . Difficulty of Paying Living Expenses: Not very hard  Food Insecurity: No Food Insecurity  . Worried About Ship broker in the Last Year: Never true  . Ran Out of Food in the Last Year: Never true  Transportation Needs: Unmet Transportation Needs  . Lack of Transportation (Medical): Yes  . Lack of Transportation (Non-Medical): Yes  Physical Activity:   . Days of Exercise per Week:   . Minutes of Exercise per Session:   Stress:   . Feeling of Stress :   Social Connections:   . Frequency of Communication with Friends and Family:   . Frequency of Social Gatherings with Friends and Family:   . Attends Religious Services:   . Active Member of Clubs or Organizations:   . Attends Archivist Meetings:   [name] Kitchen Marital Status:   Intimate Partner Violence:   . Fear of Current or Ex-Partner:   . Emotionally Abused:   [name] Kitchen Physically Abused:   . Sexually Abused:     Physical Exam   Arrived for home visit for [name] who was alert and oriented standing on his porch on my arrival. [name] stated he had been busy today working outside and running errands. [name] denied increased shortness of breath, dizziness, chest pain or trouble eating or sleeping. Vitals were obtained. Medications were reviewed and confirmed. Pill box for one week filled. [name] needs two refills. [name] asked Tammy Sours SW to reach out to him regarding some insurance questions. I will have [place] reach out. [name] agreed to visit in one week. Home visit complete.     Refills:  Gabapentin Eliquis   Future Appointments  Date Time Provider [place]  03/15/2020  8:00 AM [name], MD [facility]  03/26/2020 10:00 AM MC ECHO OP 1 [facility]  03/26/2020 11:20 AM [name], MD [facility] None  04/09/2020  8:00 AM Azzie Glatter, FNP [facility] None     ACTION: Home visit completed Next visit planned for one week

## 2020-03-08 ENCOUNTER — Other Ambulatory Visit (HOSPITAL_COMMUNITY): Payer: Self-pay

## 2020-03-08 ENCOUNTER — Telehealth (HOSPITAL_COMMUNITY): Payer: Self-pay | Admitting: Licensed Clinical Social Worker

## 2020-03-08 MED FILL — ELIQUIS 5 MG TABLET: 5 | 30 days supply | Qty: 60 | Fill #3

## 2020-03-08 MED FILL — GABAPENTIN 300 MG CAPSULE: 300 | 30 days supply | Qty: 120 | Fill #1

## 2020-03-08 NOTE — Telephone Encounter (Signed)
CSW informed by [employer] that pt cannot afford their Eliquis which is over $100 per patient.  CSW obtained samples for pt as he is currently out of medication and printed [place] PAP- Paramedic to pick up and take to patient  CSW will continue to follow and assist as needed  [name], [place] [facility] Desk#: [phone] Cell#: [phone]

## 2020-03-08 NOTE — Progress Notes (Signed)
Med rec for [place] in home today. Medications were reviewed and confirmed. Pill box filled accordingly. Gabapentin, Eliquis missing. Needing to be picked up from pharmacy. Patient stated his Eliquis is $142 and he cannot afford same. I reached out to Tammy Sours who will be printing Eliquis patient assistance paperwork for him to fill out and return. I will pick up samples for patient in clinic tomorrow. Patient made aware of same. I will place Eliquis in box this week. Visit complete.

## 2020-03-09 MED FILL — HUMALOG MIX 75-25 KWIKPEN: (75-25) 100 | 25 days supply | Qty: 15 | Fill #1 | Status: TO

## 2020-03-10 ENCOUNTER — Telehealth (HOSPITAL_COMMUNITY): Payer: Self-pay

## 2020-03-10 NOTE — Telephone Encounter (Signed)
Spoke to [place] who agreed to go to [facility] on Monday to sign Copay release form. I also made him aware of patient assistance for Eliquis and that I have some samples to bring him tomorrow. He agreed to same. Call complete.

## 2020-03-11 ENCOUNTER — Telehealth (HOSPITAL_COMMUNITY): Payer: Self-pay | Admitting: Licensed Clinical Social Worker

## 2020-03-11 ENCOUNTER — Other Ambulatory Visit (HOSPITAL_COMMUNITY): Payer: Self-pay

## 2020-03-11 NOTE — Telephone Encounter (Signed)
CSW called pt to check in regarding [employer] application and see if pt having any other conerns.  Pt reports he has no question regarding BMS application and states everything else is going ok at this time and has no further needs.  CSW will continue to follow and assist as needed  [name], [place] [facility] Desk#: [phone] Cell#: [phone]

## 2020-03-11 NOTE — Progress Notes (Signed)
Saw Chris at home today to provide Eliquis samples as well as obtaining signature for [employer]. Another pill box filled for patient due to my absence on Monday. [name] aware to go to [facility] pharmacy to sign release of copays. Home visit complete. Will see patient on 4/26.

## 2020-03-13 ENCOUNTER — Ambulatory Visit: Payer: Medicare Other | Attending: Internal Medicine

## 2020-03-13 DIAGNOSIS — Z23 Encounter for immunization: Secondary | ICD-10-CM

## 2020-03-13 NOTE — Progress Notes (Signed)
   Covid-19 Vaccination Clinic  Name:  [name]    MRN: [id number] DOB: 1961-08-16  03/13/2020  [name] was observed post Covid-19 immunization for 15 minutes without incident. He was provided with Vaccine Information Sheet and instruction to access the V-Safe system.   [name] was instructed to call 911 with any severe reactions post vaccine: [name] Kitchen Difficulty breathing  . Swelling of face and throat  . A fast heartbeat  . A bad rash all over body  . Dizziness and weakness   Immunizations Administered    Name Date Dose VIS Date Route   Pfizer COVID-19 Vaccine 03/13/2020 11:53 AM 0.3 mL 11/07/2019 Intramuscular   Manufacturer: [place]   Lot: [id number]   [place]: [id number]

## 2020-03-15 ENCOUNTER — Other Ambulatory Visit: Payer: Self-pay | Admitting: Cardiology

## 2020-03-15 ENCOUNTER — Other Ambulatory Visit: Payer: Self-pay

## 2020-03-15 ENCOUNTER — Ambulatory Visit (INDEPENDENT_AMBULATORY_CARE_PROVIDER_SITE_OTHER): Payer: Medicare Other | Admitting: Cardiology

## 2020-03-15 VITALS — BP 128/62 | HR 77 | Ht 74.0 in | Wt 307.4 lb

## 2020-03-15 DIAGNOSIS — I5022 Chronic systolic (congestive) heart failure: Secondary | ICD-10-CM | POA: Diagnosis not present

## 2020-03-15 DIAGNOSIS — E7800 Pure hypercholesterolemia, unspecified: Secondary | ICD-10-CM

## 2020-03-15 DIAGNOSIS — I10 Essential (primary) hypertension: Secondary | ICD-10-CM | POA: Diagnosis not present

## 2020-03-15 DIAGNOSIS — I2510 Atherosclerotic heart disease of native coronary artery without angina pectoris: Secondary | ICD-10-CM

## 2020-03-15 DIAGNOSIS — G4733 Obstructive sleep apnea (adult) (pediatric): Secondary | ICD-10-CM | POA: Diagnosis not present

## 2020-03-15 DIAGNOSIS — Z9989 Dependence on other enabling machines and devices: Secondary | ICD-10-CM

## 2020-03-15 DIAGNOSIS — I480 Paroxysmal atrial fibrillation: Secondary | ICD-10-CM

## 2020-03-15 MED ORDER — CARVEDILOL 12.5 MG PO TABS: 12.5000 mg | ORAL_TABLET | Freq: Two times a day (BID) | ORAL | 2 refills | Status: DC

## 2020-03-15 MED ORDER — POTASSIUM CHLORIDE CRYS ER 20 MEQ PO TBCR: 20.0000 meq | EXTENDED_RELEASE_TABLET | Freq: Two times a day (BID) | ORAL | 6 refills | Status: DC

## 2020-03-15 MED ORDER — ATORVASTATIN CALCIUM 40 MG PO TABS: 40.0000 mg | ORAL_TABLET | Freq: Every day | ORAL | 3 refills | Status: DC

## 2020-03-15 MED ORDER — FUROSEMIDE 40 MG PO TABS: 40.0000 mg | ORAL_TABLET | Freq: Two times a day (BID) | ORAL | 11 refills | Status: DC

## 2020-03-15 MED ORDER — SACUBITRIL-VALSARTAN 49-51 MG PO TABS: 1.0000 | ORAL_TABLET | Freq: Two times a day (BID) | ORAL | 6 refills | Status: DC

## 2020-03-15 MED ORDER — SPIRONOLACTONE 25 MG PO TABS
25.0000 mg | ORAL_TABLET | Freq: Every day | ORAL | 11 refills | Status: DC
  Filled 2021-03-10: qty 30, 30d supply, fill #0

## 2020-03-15 MED FILL — FUROSEMIDE 40 MG TAB: 40 | 90 days supply | Qty: 180 | Fill #0

## 2020-03-15 MED FILL — CARVEDILOL 12.5 MG TABLET: 12.5 | 90 days supply | Qty: 180 | Fill #0

## 2020-03-15 MED FILL — POTASSIUM CL ER 20 MEQ TABL: 20 | 30 days supply | Qty: 60 | Fill #0

## 2020-03-15 MED FILL — ATORVASTATIN CALCIUM 40 MG: 40 | 90 days supply | Qty: 90 | Fill #0

## 2020-03-15 MED FILL — SPIRONOLACTONE 25 MG TABLET: 25 | 30 days supply | Qty: 30 | Fill #0

## 2020-03-15 MED FILL — ENTRESTO 49 MG-51 MG TABLET: 49-51 | 30 days supply | Qty: 60 | Fill #0

## 2020-03-15 NOTE — Progress Notes (Signed)
Cardiology Office Note:    Date:  03/15/2020   ID:  [name], DOB 1961/07/27, MRN [id number]  PCP:  Nolene Ebbs, MD  Cardiologist:  [name], MD    Referring MD: Nolene Ebbs, MD   Chief Complaint  Patient presents with  . Coronary Artery Disease  . Atrial Fibrillation  . Hypertension  . Hyperlipidemia  . Sleep Apnea  . Congestive Heart Failure    History of Present Illness:    [name] is a 59 y.o. 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  He was admitted 11/14-11/19 with acute CHF and possible pneumonia. Of note, he had not taken any medications for 7 mos prior to admission. His EF was noted to be down at 25-30% on echocardiogram and he underwent cardiac catheterization. This demonstrated small vessel disease with severe disease in a small Dx and a small OM1. The previous stent in OM2 was occluded and there was severe disease in the RCA. The LAD was patent. His cardiomyopathy was felt to be mixed and the non-ischemic portion was the major contributor to his low EF. He was noted to be in possible atrial fibrillation upon admission. There was some question as to whether this was sinus rhythm with PACs and multifocal atrial tachycardia. But he was ultimately left on anticoagulationwith Apixaban.  He is here today for followup and is doing well.  He denies any  PND, orthopnea, LE edema, dizziness, palpitations or syncope. He has minimal DOE which is stable and occasional atypical chest pain. He is compliant with his meds and is tolerating meds with no SE.  He says that he has been eating too  much and gained 7lbs on his scales.  Past Medical History:  Diagnosis Date  . Anxiety   . CAD (coronary artery disease)   . CHF (congestive heart failure) ([place]) 09/2019  . Depression   . Diabetes mellitus   . Erectile dysfunction 11/2019  . H/O right heart catheterization 09/2019  . Hypertension   . Schizophrenia [facility])     Past Surgical History:  Procedure Laterality Date  . CORONARY STENT PLACEMENT    . HERNIA REPAIR    . RIGHT/LEFT HEART CATH AND CORONARY ANGIOGRAPHY N/A 10/14/2019   Procedure: RIGHT/LEFT HEART CATH AND CORONARY ANGIOGRAPHY;  Surgeon: Belva Crome, MD;  Location: [facility];  Service: Cardiovascular;  Laterality: N/A;    Current Medications: Current Meds  Medication Sig  . acetaminophen (TYLENOL) 500 MG tablet Take 500 mg by mouth every 6 (six) hours as needed for mild pain or moderate pain.  [name] Kitchen apixaban (ELIQUIS) 5 MG TABS tablet Take 1 tablet (5 mg total) by mouth 2 (two) times daily.  [name] Kitchen aspirin EC 81 MG tablet Take 81 mg by mouth daily.  [name] Kitchen atorvastatin (LIPITOR) 40 MG tablet Take 1 tablet (40 mg total) by mouth daily at 6 PM.  . blood glucose meter kit and supplies KIT Dispense based on patient and insurance preference. Use up to four times daily as directed. (FOR ICD-9 250.00, 250.01).  . carvedilol (COREG) 12.5 MG tablet Take 1 tablet (12.5 mg total) by mouth 2 (two) times daily with  a meal.  . cyclobenzaprine (FLEXERIL) 10 MG tablet Take 1 tablet (10 mg total) by mouth 3 (three) times daily as needed for muscle spasms.  . furosemide (LASIX) 40 MG tablet Take 1 tablet (40 mg total) by mouth 2 (two) times daily.  [name] Kitchen gabapentin (NEURONTIN) 300 MG capsule Take 2 capsules (600 mg = total), by mouth, 2 times daily.  . Insulin Lispro Prot & Lispro (HUMALOG MIX 75/25 KWIKPEN) (75-25) 100 UNIT/ML Kwikpen Inject 30 Units into the skin 2 (two) times daily.  . Multiple Vitamin (MULTIVITAMIN) tablet Take 1 tablet by mouth daily.  [name] Kitchen NOVOLOG MIX 70/30 FLEXPEN  (70-30) 100 UNIT/ML FlexPen INJECT 30 UNITS INTO THE SKIN 2 (TWO) TIMES DAILY.  [name] Kitchen potassium chloride SA (KLOR-CON) 20 MEQ tablet Take 1 tablet (20 mEq total) by mouth 2 (two) times daily.  . QUEtiapine (SEROQUEL) 100 MG tablet Take 1 tablet (100 mg total) by mouth at bedtime.  . sacubitril-valsartan (ENTRESTO) 49-51 MG Take 1 tablet by mouth 2 (two) times daily.  . sildenafil (VIAGRA) 100 MG tablet Take 1 tablet (100 mg total) by mouth daily as needed for erectile dysfunction.  [name] Kitchen spironolactone (ALDACTONE) 25 MG tablet Take 1 tablet (25 mg total) by mouth daily.  . Turmeric 500 MG CAPS Take by mouth.  . [DISCONTINUED] atorvastatin (LIPITOR) 40 MG tablet Take 1 tablet (40 mg total) by mouth daily at 6 PM.  . [DISCONTINUED] carvedilol (COREG) 12.5 MG tablet Take 1 tablet (12.5 mg total) by mouth 2 (two) times daily with a meal.  . [DISCONTINUED] furosemide (LASIX) 40 MG tablet Take 1 tablet (40 mg total) by mouth 2 (two) times daily.  . [DISCONTINUED] potassium chloride SA (KLOR-CON) 20 MEQ tablet Take 1 tablet (20 mEq total) by mouth 2 (two) times daily.  . [DISCONTINUED] sacubitril-valsartan (ENTRESTO) 49-51 MG Take 1 tablet by mouth 2 (two) times daily.  . [DISCONTINUED] spironolactone (ALDACTONE) 25 MG tablet Take 1 tablet (25 mg total) by mouth daily. (Patient taking differently: Take 25 mg by mouth at bedtime. )     Allergies:   Patient has no known allergies.   Social History   Socioeconomic History  . Marital status: Legally Separated    Spouse name: Not on file  . Number of children: Not on file  . Years of education: Not on file  . Highest education level: Not on file  Occupational History  . Not on file  Tobacco Use  . 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 Smokeless tobacco: Never Used  Substance and Sexual Activity  . Alcohol use: No  . Drug use: No  . Sexual activity: Yes  Other Topics Concern  . Not on file  Social History Narrative  . Not on file   Social Determinants  of Health   Financial Resource Strain: Low Risk   . Difficulty of Paying Living Expenses: Not very hard  Food Insecurity: No Food Insecurity  . Worried About Charity fundraiser in the Last Year: Never true  . Ran Out of Food in the Last Year: Never true  Transportation Needs: Unmet Transportation Needs  . Lack of Transportation (Medical): Yes  . Lack of Transportation (Non-Medical): Yes  Physical Activity:   . Days of Exercise per Week:   . Minutes of Exercise per Session:   Stress:   . Feeling of Stress :   Social Connections:   . Frequency of Communication with Friends and Family:   . Frequency of Social Gatherings with Friends and Family:   .  Attends Religious Services:   . Active Member of Clubs or Organizations:   . Attends Archivist Meetings:   [name] Kitchen Marital Status:      Family History: The patient's He family history includes Heart failure in his mother; Mental illness in his sister and sister.  ROS:   Please see the history of present illness.    ROS  All other systems reviewed and negative.   EKGs/Labs/Other Studies Reviewed:    The following studies were reviewed today: He  EKG:  EKG is not  ordered today.   Recent Labs: 10/16/2019: Magnesium 1.8 11/03/2019: NT-Pro BNP 6,177 12/02/2019: ALT 23; Hemoglobin 11.3; Platelets 306; TSH 4.110 12/23/2019: B Natriuretic Peptide 1,322.6 02/05/2020: BUN 20; Creatinine, Ser 1.34; Potassium 4.2; Sodium 138   Recent Lipid Panel    Component Value Date/Time   CHOL 120 12/02/2019 1402   TRIG 51 12/02/2019 1402   HDL 42 12/02/2019 1402   CHOLHDL 2.9 12/02/2019 1402   CHOLHDL 3.6 03/29/2015 1402   VLDL 24 03/29/2015 1402   LDLCALC 66 12/02/2019 1402    Physical Exam:    VS:  BP 128/62   Pulse 77   Ht '6\' 2"'$  (1.88 m)   Wt (!) 307 lb 6.4 oz (139.4 kg)   SpO2 96%   BMI 39.47 kg/m     Wt Readings from Last 3 Encounters:  03/15/20 (!) 307 lb 6.4 oz (139.4 kg)  03/02/20 298 lb (135.2 kg)  02/23/20 299 lb  (135.6 kg)     GEN:  Well nourished, well developed in no acute distress HEENT: Normal NECK:  No carotid bruits.  JVD difficult to assess due to body habitus LYMPHATICS: No lymphadenopathy CARDIAC: RRR, no murmurs, rubs, gallops RESPIRATORY:  Clear to auscultation without rales, wheezing or rhonchi  ABDOMEN: Soft, non-tender, non-distended MUSCULOSKELETAL:  No edema; No deformity  SKIN: Warm and dry NEUROLOGIC:  Alert and oriented x 3 PSYCHIATRIC:  Normal affect   ASSESSMENT:    1. PAF (paroxysmal atrial fibrillation) ([place])   2. Chronic systolic heart failure ([place])   3. Essential hypertension   4. Coronary artery disease involving native coronary artery of native heart without angina pectoris   5. OSA on CPAP   6. Pure hypercholesterolemia   7. Morbid obesity ([place])    PLAN:    In order of problems listed above:  1.  Paroxysmal atrial fibrillation -Maintaining NSR on exam today -denies any bleeding problems on DOAC -Continue Carvedilol 12.'5mg'$  BID and  Eliquis '5mg'$  BID -creatinine was 1.34 in March 2021 and Hbg 11.2 in Nov 2020  2.  Chronic systolic CHF -appears euvolemic on exam but weight is up 7lbs.  He thinks it is because he has been eating fattening foods -continue lasix '40mg'$  daily, Carvedilol 12.'5mg'$  BID, Entrestot 49-'51mg'$  BID, lasix '40mg'$  BID and spiro '25mg'$  daily -creatinine stable at 1.34 last month -I will repeat a BMET and BNp due to increase in weight but I do not think that weight gain is from fluid.  He has no LE edema on exam and lungs are clear.  He has no extra fluid in his abdomen.  JVD is hard to appreciate given body habitus.  3.  HTN -BP well controlled -continue Carvedilol 6.'25mg'$  BID, Entresto and spiro '25mg'$  daily  4.  CAD -s/p cath 11/20 with occluded OM2 stent, 90% D1 and severe diffuse disease in the RCA with no PCI -he denies any chest pain -continue on statin and BB -no ASA due to DOA  5.  OSA -  The patient is tolerating PAP therapy well  without any problems. The patient has been using and benefiting from PAP use and will continue to benefit from therapy. I will get a download from the DME.   6.  HLD -LDL goal < 70 -continue atorvastatin 24m daily -LDL was 66 in Jan 2021  7.  Morbid Obesity -I have encouraged him to get into a routine exercise program and cut back on carbs and portions.      Medication Adjustments/Labs and Tests Ordered: Current medicines are reviewed at length with the patient today.  Concerns regarding medicines are outlined above.  Orders Placed This Encounter  Procedures  . Pro b natriuretic peptide (BNP)  . Basic metabolic panel   Meds ordered this encounter  Medications  . atorvastatin (LIPITOR) 40 MG tablet    Sig: Take 1 tablet (40 mg total) by mouth daily at 6 PM.    Dispense:  90 tablet    Refill:  3  . carvedilol (COREG) 12.5 MG tablet    Sig: Take 1 tablet (12.5 mg total) by mouth 2 (two) times daily with a meal.    Dispense:  60 tablet    Refill:  2  . furosemide (LASIX) 40 MG tablet    Sig: Take 1 tablet (40 mg total) by mouth 2 (two) times daily.    Dispense:  60 tablet    Refill:  11  . potassium chloride SA (KLOR-CON) 20 MEQ tablet    Sig: Take 1 tablet (20 mEq total) by mouth 2 (two) times daily.    Dispense:  60 tablet    Refill:  6  . sacubitril-valsartan (ENTRESTO) 49-51 MG    Sig: Take 1 tablet by mouth 2 (two) times daily.    Dispense:  60 tablet    Refill:  6  . spironolactone (ALDACTONE) 25 MG tablet    Sig: Take 1 tablet (25 mg total) by mouth daily.    Dispense:  30 tablet    Refill:  11    Signed, TFransico Him MD  03/15/2020 8:30 AM    [place]

## 2020-03-15 NOTE — Patient Instructions (Signed)
Medication Instructions:  Your physician recommends that you continue on your current medications as directed. Please refer to the Current Medication list given to you today.  *If you need a refill on your cardiac medications before your next appointment, please call your pharmacy*  Lab Work: TODAY: BMET and BNP If you have labs (blood work) drawn today and your tests are completely normal, you will receive your results only by: [name] Kitchen MyChart Message (if you have MyChart) OR . A paper copy in the mail If you have any lab test that is abnormal or we need to change your treatment, we will call you to review the results.   Follow-Up: At [facility], you and your health needs are our priority.  As part of our continuing mission to provide you with exceptional heart care, we have created designated Provider Care Teams.  These Care Teams include your primary Cardiologist (physician) and Advanced Practice Providers (APPs -  Physician Assistants and Nurse Practitioners) who all work together to provide you with the care you need, when you need it.  We recommend signing up for the patient portal called "MyChart".  Sign up information is provided on this After Visit Summary.  MyChart is used to connect with patients for Virtual Visits (Telemedicine).  Patients are able to view lab/test results, encounter notes, upcoming appointments, etc.  Non-urgent messages can be sent to your provider as well.   To learn more about what you can do with MyChart, go to [email or web address].

## 2020-03-16 LAB — BASIC METABOLIC PANEL
BUN/Creatinine Ratio: 23 — ABNORMAL HIGH (ref 9–20)
BUN: 32 mg/dL — ABNORMAL HIGH (ref 6–24)
CO2: 20 mmol/L (ref 20–29)
Calcium: 8.8 mg/dL (ref 8.7–10.2)
Chloride: 102 mmol/L (ref 96–106)
Creatinine, Ser: 1.37 mg/dL — ABNORMAL HIGH (ref 0.76–1.27)
GFR calc Af Amer: 65 mL/min/{1.73_m2} (ref >59)
GFR calc non Af Amer: 56 mL/min/{1.73_m2} — ABNORMAL LOW (ref >59)
Glucose: 221 mg/dL — ABNORMAL HIGH (ref 65–99)
Potassium: 5 mmol/L (ref 3.5–5.2)
Sodium: 136 mmol/L (ref 134–144)

## 2020-03-16 LAB — PRO B NATRIURETIC PEPTIDE: NT-Pro BNP: 231 pg/mL — ABNORMAL HIGH (ref 0–210)

## 2020-03-18 ENCOUNTER — Telehealth: Payer: Self-pay | Admitting: *Deleted

## 2020-03-18 ENCOUNTER — Telehealth: Payer: Self-pay

## 2020-03-18 DIAGNOSIS — Z79899 Other long term (current) drug therapy: Secondary | ICD-10-CM

## 2020-03-18 DIAGNOSIS — R7989 Other specified abnormal findings of blood chemistry: Secondary | ICD-10-CM

## 2020-03-18 NOTE — Telephone Encounter (Signed)
-----   Message from [name], MD sent at 03/15/2020  8:32 AM EDT ----- Please change DME to [facility] and get supplies and a download

## 2020-03-18 NOTE — Telephone Encounter (Signed)
Called with lab results. Per Dr. Heron Nay, BNP mildly elevated but may be falsely low with obesity - it has improved from 4 months ago.  Since weight is up 7lbs, please have patient increase lasix to 80mg  BID for 2 days and then back to 40mg  BID and repeat BMET and BNP on Friday. Patient will come back on Friday on next week for repeat lab work after his echo and office visit with Dr. Aundra Dubin. Patient verbalized understanding.

## 2020-03-18 NOTE — Telephone Encounter (Signed)
-----   Message from [name], MD sent at 03/16/2020  7:03 AM EDT ----- BNP mildly elevated but may be falsely low with obesity - it has improved from 4 months ago.  Since weight is up 7lbs, please have patient increase lasix to 80mg  BID for 2 days and then back to 40mg  BID and repeat BMET and BNP on Friday

## 2020-03-18 NOTE — Telephone Encounter (Signed)
Fax sent to [facility] for sleep study. DME requires a sleep study.

## 2020-03-19 MED FILL — FUROSEMIDE 40 MG TAB: 40 | 30 days supply | Qty: 60 | Fill #4

## 2020-03-19 MED FILL — ?ATORVASTATIN 40MG TABLET: 40 | 30 days supply | Qty: 30 | Fill #4

## 2020-03-19 MED FILL — QUETIAPINE FUMARATE 200 MG: 200 | 30 days supply | Qty: 30 | Fill #1

## 2020-03-24 ENCOUNTER — Telehealth (HOSPITAL_COMMUNITY): Payer: Self-pay

## 2020-03-24 NOTE — Telephone Encounter (Signed)
Patient was contacted for home visit stating he was not home at the time but would call back once he arrived to schedule home visit. No call made back to me and I attempted to reach patient multiple times since with no success. Will attempt to reach next week.

## 2020-03-26 ENCOUNTER — Other Ambulatory Visit: Payer: Medicare Other

## 2020-03-26 ENCOUNTER — Inpatient Hospital Stay (HOSPITAL_COMMUNITY): Admission: RE | Admit: 2020-03-26 | Payer: Medicare Other | Source: Ambulatory Visit | Admitting: Cardiology

## 2020-03-26 ENCOUNTER — Ambulatory Visit (HOSPITAL_COMMUNITY): Admission: RE | Admit: 2020-03-26 | Payer: Medicare Other | Source: Ambulatory Visit

## 2020-03-29 ENCOUNTER — Other Ambulatory Visit (HOSPITAL_COMMUNITY): Payer: Self-pay

## 2020-03-29 ENCOUNTER — Telehealth (HOSPITAL_COMMUNITY): Payer: Self-pay | Admitting: *Deleted

## 2020-03-29 NOTE — Telephone Encounter (Signed)
[name] with paramedicine called to report patient has been feeling dizzy and more short of breath. Pt is not orthostatic. Pt bp 100/60 sitting and standing. No edema and weight it stable. Pt missed his echo and follow up appt last week. Appointment rescheduled for next monday 5/10.    Routed to [place] for any other changes before appt

## 2020-03-29 NOTE — Progress Notes (Signed)
Paramedicine Encounter    Patient ID: [name], male    DOB: 05/02/1961, 59 y.o.   MRN: [id number]   Patient Care Team: Nolene Ebbs, MD as PCP - General (Internal Medicine) [name], MD as PCP - Cardiology (Cardiology)  Patient Active Problem List   Diagnosis Date Noted  . Type 2 diabetes mellitus with hyperglycemia, with long-term current use of insulin ([place]) 12/03/2019  . Diabetic polyneuropathy associated with type 2 diabetes mellitus ([place]) 12/03/2019  . Hyperglycemia 12/03/2019  . History of hyperglycemia 12/03/2019  . Erectile dysfunction 12/03/2019  . CHF (congestive heart failure) ([place]) 10/11/2019  . Paranoid schizophrenia, chronic condition ([place]) 01/26/2015  . Severe recurrent major depressive disorder with psychotic features ([place]) 01/26/2015  . GAD (generalized anxiety disorder) 01/26/2015  . OCD (obsessive compulsive disorder) 01/26/2015  . Panic disorder without agoraphobia 01/26/2015  . Insomnia 01/26/2015    Current Outpatient Medications:  .  acetaminophen (TYLENOL) 500 MG tablet, Take 500 mg by mouth every 6 (six) hours as needed for mild pain or moderate pain., Disp: , Rfl:  .  apixaban (ELIQUIS) 5 MG TABS tablet, Take 1 tablet (5 mg total) by mouth 2 (two) times daily., Disp: 180 tablet, Rfl: 3 .  aspirin EC 81 MG tablet, Take 81 mg by mouth daily., Disp: , Rfl:  .  atorvastatin (LIPITOR) 40 MG tablet, Take 1 tablet (40 mg total) by mouth daily at 6 PM., Disp: 90 tablet, Rfl: 3 .  blood glucose meter kit and supplies KIT, Dispense based on patient and insurance preference. Use up to four times daily as directed. (FOR ICD-9 250.00, 250.01)., Disp: 1 each, Rfl: 0 .  carvedilol (COREG) 12.5 MG tablet, Take 1 tablet (12.5 mg total) by mouth 2 (two) times daily with a meal., Disp: 60 tablet, Rfl: 2 .  cyclobenzaprine (FLEXERIL) 10 MG tablet, Take 1 tablet (10 mg total) by mouth 3 (three) times daily as needed for muscle spasms., Disp: 30 tablet, Rfl:  3 .  furosemide (LASIX) 40 MG tablet, Take 1 tablet (40 mg total) by mouth 2 (two) times daily., Disp: 60 tablet, Rfl: 11 .  gabapentin (NEURONTIN) 300 MG capsule, Take 2 capsules (600 mg = total), by mouth, 2 times daily., Disp: 120 capsule, Rfl: 3 .  Insulin Lispro Prot & Lispro (HUMALOG MIX 75/25 KWIKPEN) (75-25) 100 UNIT/ML Kwikpen, Inject 30 Units into the skin 2 (two) times daily., Disp: 15 mL, Rfl: 11 .  NOVOLOG MIX 70/30 FLEXPEN (70-30) 100 UNIT/ML FlexPen, INJECT 30 UNITS INTO THE SKIN 2 (TWO) TIMES DAILY., Disp: 9 mL, Rfl: 12 .  potassium chloride SA (KLOR-CON) 20 MEQ tablet, Take 1 tablet (20 mEq total) by mouth 2 (two) times daily., Disp: 60 tablet, Rfl: 6 .  QUEtiapine (SEROQUEL) 100 MG tablet, Take 1 tablet (100 mg total) by mouth at bedtime., Disp: 30 tablet, Rfl: 6 .  sacubitril-valsartan (ENTRESTO) 49-51 MG, Take 1 tablet by mouth 2 (two) times daily., Disp: 60 tablet, Rfl: 6 .  sildenafil (VIAGRA) 100 MG tablet, Take 1 tablet (100 mg total) by mouth daily as needed for erectile dysfunction., Disp: 10 tablet, Rfl: 1 .  spironolactone (ALDACTONE) 25 MG tablet, Take 1 tablet (25 mg total) by mouth daily., Disp: 30 tablet, Rfl: 11 .  Turmeric 500 MG CAPS, Take by mouth., Disp: , Rfl:  .  Multiple Vitamin (MULTIVITAMIN) tablet, Take 1 tablet by mouth daily., Disp: , Rfl:  No Known Allergies   Social History   Socioeconomic History  .  Marital status: Legally Separated    Spouse name: Not on file  . Number of children: Not on file  . Years of education: Not on file  . Highest education level: Not on file  Occupational History  . Not on file  Tobacco Use  . 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 Smokeless tobacco: Never Used  Substance and Sexual Activity  . Alcohol use: No  . Drug use: No  . Sexual activity: Yes  Other Topics Concern  . Not on file  Social History Narrative  . Not on file   Social Determinants of Health   Financial Resource Strain: Low Risk   . Difficulty  of Paying Living Expenses: Not very hard  Food Insecurity: No Food Insecurity  . Worried About Charity fundraiser in the Last Year: Never true  . Ran Out of Food in the Last Year: Never true  Transportation Needs: Unmet Transportation Needs  . Lack of Transportation (Medical): Yes  . Lack of Transportation (Non-Medical): Yes  Physical Activity:   . Days of Exercise per Week:   . Minutes of Exercise per Session:   Stress:   . Feeling of Stress :   Social Connections:   . Frequency of Communication with Friends and Family:   . Frequency of Social Gatherings with Friends and Family:   . Attends Religious Services:   . Active Member of Clubs or Organizations:   . Attends Archivist Meetings:   [name] Kitchen Marital Status:   Intimate Partner Violence:   . Fear of Current or Ex-Partner:   . Emotionally Abused:   [name] Kitchen Physically Abused:   . Sexually Abused:     Physical Exam Vitals reviewed.  Constitutional:      Appearance: He is obese.  HENT:     Head: Normocephalic.     Nose: Nose normal.     Mouth/Throat:     Mouth: Mucous membranes are moist.  Eyes:     Pupils: Pupils are equal, round, and reactive to light.  Cardiovascular:     Rate and Rhythm: Normal rate and regular rhythm.     Pulses: Normal pulses.  Pulmonary:     Effort: Pulmonary effort is normal.     Breath sounds: Normal breath sounds.  Abdominal:     General: There is no distension.     Palpations: Abdomen is soft.  Musculoskeletal:        General: Normal range of motion.     Cervical back: Normal range of motion and neck supple.     Right lower leg: No edema.     Left lower leg: No edema.  Skin:    General: Skin is warm and dry.     Capillary Refill: Capillary refill takes less than 2 seconds.  Neurological:     Mental Status: He is alert. Mental status is at baseline.  Psychiatric:        Mood and Affect: Mood normal.      Arrived for home visit for [name] who reports he had a rough weekend with  complaints on Friday and Saturday of dizziness with near syncope, chest pain and shortness of breath and not taking his medicines for a few days. [name] reports he drank alcohol over the weekend and ate a lot of fried foods and fast foods. I educated [name] on the importance of a healthy diet, decreased use of alcohol and importance of taking his medications. He verbalized understanding. We rescheduled his HF appointment for Monday May  10th at 0915 for ECHO and APP visit. [name] agreed with same. Vitals were obtained. Medications were reviewed and verified. Pill box filled accordingly. [name] requests pharmacy to confirm his medications and cost of co-pays with insurance. I will confirm same this week. Home visit complete for [facility]. He was instructed if symptoms came back to call me. He understood. Home visit complete. Will see next week in clinic.    Future Appointments  Date Time Provider [place]  04/05/2020  9:15 AM [facility] ECHO OP 2 [facility]  04/05/2020 10:20 AM [name], MD [facility] None  04/07/2020 10:00 AM [place] [facility]  04/09/2020  8:00 AM Azzie Glatter, FNP [facility] None     ACTION: Home visit completed Next visit planned for one week

## 2020-04-05 ENCOUNTER — Inpatient Hospital Stay (HOSPITAL_COMMUNITY)
Admission: AD | Admit: 2020-04-05 | Discharge: 2020-04-09 | DRG: 377 | Disposition: A | Discharge status: Home / Self Care | Payer: Medicare Other | Source: Ambulatory Visit | Attending: Cardiology | Admitting: Cardiology

## 2020-04-05 ENCOUNTER — Ambulatory Visit (HOSPITAL_COMMUNITY)
Admission: RE | Admit: 2020-04-05 | Discharge: 2020-04-05 | Disposition: A | Discharge status: Home / Self Care | Payer: Medicare Other | Source: Ambulatory Visit | Attending: Cardiology | Admitting: Cardiology

## 2020-04-05 ENCOUNTER — Ambulatory Visit (HOSPITAL_BASED_OUTPATIENT_CLINIC_OR_DEPARTMENT_OTHER)
Admission: RE | Admit: 2020-04-05 | Discharge: 2020-04-05 | Disposition: A | Discharge status: Home / Self Care | Payer: Medicare Other | Source: Ambulatory Visit | Attending: Adult Health | Admitting: Adult Health

## 2020-04-05 ENCOUNTER — Other Ambulatory Visit: Payer: Self-pay

## 2020-04-05 ENCOUNTER — Encounter (HOSPITAL_COMMUNITY): Payer: Self-pay | Admitting: Cardiology

## 2020-04-05 VITALS — BP 108/67 | HR 58 | Wt 310.0 lb

## 2020-04-05 DIAGNOSIS — K5521 Angiodysplasia of colon with hemorrhage: Secondary | ICD-10-CM | POA: Diagnosis present

## 2020-04-05 DIAGNOSIS — I255 Ischemic cardiomyopathy: Secondary | ICD-10-CM | POA: Diagnosis not present

## 2020-04-05 DIAGNOSIS — D649 Anemia, unspecified: Secondary | ICD-10-CM | POA: Diagnosis not present

## 2020-04-05 DIAGNOSIS — Z794 Long term (current) use of insulin: Secondary | ICD-10-CM | POA: Insufficient documentation

## 2020-04-05 DIAGNOSIS — D631 Anemia in chronic kidney disease: Secondary | ICD-10-CM | POA: Diagnosis present

## 2020-04-05 DIAGNOSIS — K31811 Angiodysplasia of stomach and duodenum with bleeding: Principal | ICD-10-CM | POA: Diagnosis present

## 2020-04-05 DIAGNOSIS — Z9989 Dependence on other enabling machines and devices: Secondary | ICD-10-CM

## 2020-04-05 DIAGNOSIS — K5520 Angiodysplasia of colon without hemorrhage: Secondary | ICD-10-CM | POA: Diagnosis present

## 2020-04-05 DIAGNOSIS — Z87891 Personal history of nicotine dependence: Secondary | ICD-10-CM | POA: Insufficient documentation

## 2020-04-05 DIAGNOSIS — N1831 Chronic kidney disease, stage 3a: Secondary | ICD-10-CM | POA: Diagnosis not present

## 2020-04-05 DIAGNOSIS — I2510 Atherosclerotic heart disease of native coronary artery without angina pectoris: Secondary | ICD-10-CM | POA: Insufficient documentation

## 2020-04-05 DIAGNOSIS — Z6841 Body Mass Index (BMI) 40.0 and over, adult: Secondary | ICD-10-CM

## 2020-04-05 DIAGNOSIS — K31819 Angiodysplasia of stomach and duodenum without bleeding: Secondary | ICD-10-CM | POA: Diagnosis not present

## 2020-04-05 DIAGNOSIS — I5022 Chronic systolic (congestive) heart failure: Secondary | ICD-10-CM

## 2020-04-05 DIAGNOSIS — E861 Hypovolemia: Secondary | ICD-10-CM | POA: Diagnosis present

## 2020-04-05 DIAGNOSIS — D62 Acute posthemorrhagic anemia: Secondary | ICD-10-CM | POA: Diagnosis present

## 2020-04-05 DIAGNOSIS — Z7901 Long term (current) use of anticoagulants: Secondary | ICD-10-CM | POA: Insufficient documentation

## 2020-04-05 DIAGNOSIS — R55 Syncope and collapse: Secondary | ICD-10-CM

## 2020-04-05 DIAGNOSIS — I130 Hypertensive heart and chronic kidney disease with heart failure and stage 1 through stage 4 chronic kidney disease, or unspecified chronic kidney disease: Secondary | ICD-10-CM | POA: Diagnosis present

## 2020-04-05 DIAGNOSIS — E785 Hyperlipidemia, unspecified: Secondary | ICD-10-CM | POA: Diagnosis present

## 2020-04-05 DIAGNOSIS — F209 Schizophrenia, unspecified: Secondary | ICD-10-CM | POA: Diagnosis present

## 2020-04-05 DIAGNOSIS — Z8249 Family history of ischemic heart disease and other diseases of the circulatory system: Secondary | ICD-10-CM

## 2020-04-05 DIAGNOSIS — I5023 Acute on chronic systolic (congestive) heart failure: Secondary | ICD-10-CM | POA: Diagnosis present

## 2020-04-05 DIAGNOSIS — E1122 Type 2 diabetes mellitus with diabetic chronic kidney disease: Secondary | ICD-10-CM | POA: Diagnosis present

## 2020-04-05 DIAGNOSIS — G4733 Obstructive sleep apnea (adult) (pediatric): Secondary | ICD-10-CM

## 2020-04-05 DIAGNOSIS — Z7982 Long term (current) use of aspirin: Secondary | ICD-10-CM

## 2020-04-05 DIAGNOSIS — E119 Type 2 diabetes mellitus without complications: Secondary | ICD-10-CM | POA: Insufficient documentation

## 2020-04-05 DIAGNOSIS — Z20822 Contact with and (suspected) exposure to covid-19: Secondary | ICD-10-CM | POA: Diagnosis not present

## 2020-04-05 DIAGNOSIS — J811 Chronic pulmonary edema: Secondary | ICD-10-CM | POA: Diagnosis not present

## 2020-04-05 DIAGNOSIS — K921 Melena: Secondary | ICD-10-CM | POA: Diagnosis not present

## 2020-04-05 DIAGNOSIS — I480 Paroxysmal atrial fibrillation: Secondary | ICD-10-CM

## 2020-04-05 DIAGNOSIS — I5043 Acute on chronic combined systolic (congestive) and diastolic (congestive) heart failure: Secondary | ICD-10-CM | POA: Diagnosis not present

## 2020-04-05 DIAGNOSIS — D509 Iron deficiency anemia, unspecified: Secondary | ICD-10-CM

## 2020-04-05 DIAGNOSIS — I428 Other cardiomyopathies: Secondary | ICD-10-CM | POA: Diagnosis not present

## 2020-04-05 DIAGNOSIS — N179 Acute kidney failure, unspecified: Secondary | ICD-10-CM | POA: Diagnosis present

## 2020-04-05 DIAGNOSIS — Z818 Family history of other mental and behavioral disorders: Secondary | ICD-10-CM

## 2020-04-05 DIAGNOSIS — Z955 Presence of coronary angioplasty implant and graft: Secondary | ICD-10-CM | POA: Diagnosis not present

## 2020-04-05 DIAGNOSIS — D500 Iron deficiency anemia secondary to blood loss (chronic): Secondary | ICD-10-CM

## 2020-04-05 DIAGNOSIS — R0600 Dyspnea, unspecified: Secondary | ICD-10-CM

## 2020-04-05 DIAGNOSIS — I248 Other forms of acute ischemic heart disease: Secondary | ICD-10-CM | POA: Diagnosis not present

## 2020-04-05 DIAGNOSIS — Z79899 Other long term (current) drug therapy: Secondary | ICD-10-CM

## 2020-04-05 DIAGNOSIS — R195 Other fecal abnormalities: Secondary | ICD-10-CM | POA: Diagnosis not present

## 2020-04-05 DIAGNOSIS — I110 Hypertensive heart disease with heart failure: Secondary | ICD-10-CM | POA: Insufficient documentation

## 2020-04-05 LAB — COMPREHENSIVE METABOLIC PANEL
ALT: 26 U/L (ref 0–44)
AST: 24 U/L (ref 15–41)
Albumin: 3.1 g/dL — ABNORMAL LOW (ref 3.5–5.0)
Alkaline Phosphatase: 60 U/L (ref 38–126)
Anion gap: 8 (ref 5–15)
BUN: 34 mg/dL — ABNORMAL HIGH (ref 6–20)
CO2: 21 mmol/L — ABNORMAL LOW (ref 22–32)
Calcium: 8.5 mg/dL — ABNORMAL LOW (ref 8.9–10.3)
Chloride: 104 mmol/L (ref 98–111)
Creatinine, Ser: 1.62 mg/dL — ABNORMAL HIGH (ref 0.61–1.24)
GFR calc Af Amer: 53 mL/min — ABNORMAL LOW (ref >60)
GFR calc non Af Amer: 46 mL/min — ABNORMAL LOW (ref >60)
Glucose, Bld: 133 mg/dL — ABNORMAL HIGH (ref 70–99)
Potassium: 5 mmol/L (ref 3.5–5.1)
Sodium: 133 mmol/L — ABNORMAL LOW (ref 135–145)
Total Bilirubin: 0.8 mg/dL (ref 0.3–1.2)
Total Protein: 6.8 g/dL (ref 6.5–8.1)

## 2020-04-05 LAB — CBC
HCT: 16.5 % — ABNORMAL LOW (ref 39.0–52.0)
HCT: 17.5 % — ABNORMAL LOW (ref 39.0–52.0)
Hemoglobin: 4.2 g/dL — CL (ref 13.0–17.0)
Hemoglobin: 4.8 g/dL — CL (ref 13.0–17.0)
MCH: 17.8 pg — ABNORMAL LOW (ref 26.0–34.0)
MCH: 19.4 pg — ABNORMAL LOW (ref 26.0–34.0)
MCHC: 25.5 g/dL — ABNORMAL LOW (ref 30.0–36.0)
MCHC: 27.4 g/dL — ABNORMAL LOW (ref 30.0–36.0)
MCV: 69.9 fL — ABNORMAL LOW (ref 80.0–100.0)
MCV: 70.9 fL — ABNORMAL LOW (ref 80.0–100.0)
Platelets: 335 10*3/uL (ref 150–400)
Platelets: 330 10*3/uL (ref 150–400)
RBC: 2.36 MIL/uL — ABNORMAL LOW (ref 4.22–5.81)
RBC: 2.47 MIL/uL — ABNORMAL LOW (ref 4.22–5.81)
RDW: 19.2 % — ABNORMAL HIGH (ref 11.5–15.5)
RDW: 19.7 % — ABNORMAL HIGH (ref 11.5–15.5)
WBC: 7.8 10*3/uL (ref 4.0–10.5)
WBC: 7 10*3/uL (ref 4.0–10.5)
nRBC: 1.8 % — ABNORMAL HIGH (ref 0.0–0.2)
nRBC: 1.7 % — ABNORMAL HIGH (ref 0.0–0.2)

## 2020-04-05 LAB — SARS CORONAVIRUS 2 BY RT PCR (HOSPITAL ORDER, PERFORMED IN ~~LOC~~ HOSPITAL LAB): SARS Coronavirus 2: NEGATIVE

## 2020-04-05 LAB — GLUCOSE, CAPILLARY
Glucose-Capillary: 178 mg/dL — ABNORMAL HIGH (ref 70–99)
Glucose-Capillary: 187 mg/dL — ABNORMAL HIGH (ref 70–99)

## 2020-04-05 LAB — MAGNESIUM: Magnesium: 2.3 mg/dL (ref 1.7–2.4)

## 2020-04-05 LAB — PREPARE RBC (CROSSMATCH)

## 2020-04-05 MED ORDER — SODIUM CHLORIDE 0.9 % IV SOLN: 250.0000 mL | INTRAVENOUS | Status: DC | PRN

## 2020-04-05 MED ORDER — ACETAMINOPHEN 325 MG PO TABS
650.0000 mg | ORAL_TABLET | ORAL | Status: DC | PRN
  Administered 2020-04-09: 650 mg via ORAL
  Filled 2020-04-05: qty 2

## 2020-04-05 MED ORDER — PERFLUTREN LIPID MICROSPHERE
1.0000 mL | INTRAVENOUS | Status: DC | PRN
  Administered 2020-04-05: 10:00:00 2 mL via INTRAVENOUS
  Filled 2020-04-05: qty 10

## 2020-04-05 MED ORDER — SODIUM CHLORIDE 0.9% IV SOLUTION: Freq: Once | INTRAVENOUS | Status: AC

## 2020-04-05 MED ORDER — PANTOPRAZOLE SODIUM 40 MG IV SOLR
40.0000 mg | Freq: Two times a day (BID) | INTRAVENOUS | Status: DC
  Administered 2020-04-05 – 2020-04-09 (×9): 40 mg via INTRAVENOUS
  Filled 2020-04-05 (×9): qty 40

## 2020-04-05 MED ORDER — ATORVASTATIN CALCIUM 40 MG PO TABS
40.0000 mg | ORAL_TABLET | Freq: Every day | ORAL | Status: DC
  Administered 2020-04-05 – 2020-04-08 (×4): 40 mg via ORAL
  Filled 2020-04-05 (×4): qty 1

## 2020-04-05 MED ORDER — SODIUM CHLORIDE 0.9% FLUSH
3.0000 mL | Freq: Two times a day (BID) | INTRAVENOUS | Status: DC
  Administered 2020-04-05 – 2020-04-09 (×9): 3 mL via INTRAVENOUS

## 2020-04-05 MED ORDER — FUROSEMIDE 10 MG/ML IJ SOLN: 80.0000 mg | Freq: Two times a day (BID) | INTRAMUSCULAR | Status: DC

## 2020-04-05 MED ORDER — FUROSEMIDE 10 MG/ML IJ SOLN
40.0000 mg | Freq: Two times a day (BID) | INTRAMUSCULAR | Status: DC
  Administered 2020-04-05 – 2020-04-07 (×4): 40 mg via INTRAVENOUS
  Filled 2020-04-05 (×4): qty 4

## 2020-04-05 MED ORDER — ONDANSETRON HCL 4 MG/2ML IJ SOLN: 4.0000 mg | Freq: Four times a day (QID) | INTRAMUSCULAR | Status: DC | PRN

## 2020-04-05 MED ORDER — SODIUM CHLORIDE 0.9% FLUSH: 3.0000 mL | INTRAVENOUS | Status: DC | PRN

## 2020-04-05 MED ORDER — ASPIRIN EC 81 MG PO TBEC: 81.0000 mg | DELAYED_RELEASE_TABLET | Freq: Every day | ORAL | Status: DC

## 2020-04-05 MED ORDER — APIXABAN 5 MG PO TABS: 5.0000 mg | ORAL_TABLET | Freq: Two times a day (BID) | ORAL | Status: DC

## 2020-04-05 MED ORDER — QUETIAPINE FUMARATE 100 MG PO TABS
200.0000 mg | ORAL_TABLET | Freq: Every day | ORAL | Status: DC
  Administered 2020-04-05 – 2020-04-08 (×4): 200 mg via ORAL
  Filled 2020-04-05 (×4): qty 2

## 2020-04-05 NOTE — H&P (Signed)
Advanced Heart Failure Team H&P  This note reflects work completed today.   PCP: Nolene Ebbs, MD  Cardiology: Dr. Radford Pax  HF Cardiology: Dr. Aundra Dubin   59 yo with history of chronic systolic CHF, CAD, type 2 diabetes, paroxysmal atrial fibrillation, and schizophrenia was referred by Dr. Radford Pax for evaluation of CHF. Patient had OM2 PCI in 2012. In 11/20, he was admitted with CHF. Echo showed EF 25-30% with diffuse hypokinesis. LHC was done, showing occluded OM2 at prior stent, 90% D1 stenosis, and extensive diffuse RCA disease. No intervention. He was thought to be in paroxysmal atrial fibrillation during this appointment and apixaban was started. He does not smoke, rarely drinks, and does not use drugs. His mother had "heart problems."   He can write his name only. He is only able to read a few words. Thinks he completed the 7th grade.   Patients returns today for followup of CHF. He reports 1 week of poor balance and lightheadedness with standing. He "fell out" 2-3 times, does not remember for sure. Says he lost consciousness completely after standing. Main issue is dizziness/lightheadedness, not particularly short of breath. He gets rare atypical chest pain. He denies BRBPR/melena. Says he is taking all his medicine (has paramedicine following but not here today). No palpitations. Using CPAP. Says he drank "1/2 of a fifth" of liquor this weekend, but has not drunk otherwise. No drugs. Used sildenafil 3 days ago. Came in today for echo, passed out when walking from wheelchair to bed, SBP in 90s-100s when supine. Still lightheaded when he sits up. He was given 500 cc NS in the echo lab.   Echo was done and reviewed, EF 30% with diffuse hypokinesis, mild LVH, PASP 38, mildly decreased RV systolic function, IVC dilated.  Social History --->Says he can read just a little bit. He can write his name only. Says he has a hard time reading medication labels. He doesn't know how to take a 1/2 tablet. Has  transportation issues. Lives with his sister.  Labs (1/21): LDL 66, HDL 42, hgb 11.3, K 4.7, creatinine 1.32  Labs (4/21): K 5, creatinine 1.37  ECG (personally reviewed): NSR, nonspecific inferior TWF  PMH:  1. Atrial fibrillation: Paroxysmal  2. Type 2 diabetes  3. HTN  4. Hyperlipidemia  5. Schizophrenia  6. CAD: PCI OM2 in 2012.  - LHC (11/20): 90% D1 stenosis, totally occluded OM2 at stent, serial 85%/70%/60% RCA stenoses.  7. 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  - Echo (11/20): EF 25-30%, global hypokinesis.  - Echo (5/21): EF 30% with diffuse hypokinesis, mild LVH, PASP 38, mildly decreased RV systolic function, IVC dilated.  Social History        Socioeconomic History  . Marital status: Legally Separated    Spouse name: Not on file  . Number of children: Not on file  . Years of education: Not on file  . Highest education level: Not on file  Occupational History  . Not on file  Tobacco Use  . 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 Smokeless tobacco: Never Used  Substance and Sexual Activity  . Alcohol use: No  . Drug use: No  . Sexual activity: Yes  Other Topics Concern  . Not on file  Social History Narrative  . Not on file   Social Determinants of Health      Financial Resource Strain: Low Risk   . Difficulty of Paying Living Expenses: Not very hard  Food Insecurity: No Food Insecurity  .  Worried About Charity fundraiser in the Last Year: Never true  . Ran Out of Food in the Last Year: Never true  Transportation Needs: Unmet Transportation Needs  . Lack of Transportation (Medical): Yes  . Lack of Transportation (Non-Medical): Yes  Physical Activity:   . Days of Exercise per Week:   . Minutes of Exercise per Session:   Stress:   . Feeling of Stress :   Social Connections:   . Frequency of Communication with Friends and Family:   . Frequency of Social Gatherings with Friends and Family:   . Attends Religious Services:    . Active Member of Clubs or Organizations:   . Attends Archivist Meetings:   [name] Kitchen Marital Status:   Intimate Partner Violence:   . Fear of Current or Ex-Partner:   . Emotionally Abused:   [name] Kitchen Physically Abused:   . Sexually Abused:         Family History  Problem Relation Age of Onset  . Heart failure Mother   . Mental illness Sister   . Mental illness Sister    ROS: All systems reviewed and negative except as per HPI.  Active Medications                                                                                                                  BP 108/67  Pulse (!) 58  Wt (!) 140.6 kg (310 lb) Comment: pt reported, unable to stand  SpO2 99%  BMI 39.80 kg/m     Wt Readings from Last 3 Encounters:  04/05/20 (!) 140.6 kg (310 lb)  03/29/20 (!) 140.2 kg (309 lb)  03/15/20 (!) 139.4 kg (307 lb 6.4 oz)   .med      Vitals:   04/05/20 1031 04/05/20 1050  BP: (!) 97/57 108/67  Pulse: 62 (!) 58  SpO2: 99%    General: NAD  Neck: Thick, JVP 14 cm, no thyromegaly or thyroid nodule.  Lungs: End expiratory wheezes.  CV: Nondisplaced PMI. Heart regular S1/S2, no S3/S4, no murmur. 1+ ankle edema. No carotid bruit. Normal pedal pulses.  Abdomen: Soft, nontender, no hepatosplenomegaly, no distention.  Skin: Intact without lesions or rashes.  Neurologic: Alert and oriented x 3.  Psych: Normal affect.  Extremities: No clubbing or cyanosis.  HEENT: Normal.  Assessment/Plan:  I am going to admit him today with syncope and orthostasis.  1. Acute on chronic systolic CHF: Echo in [id number] with EF 25-30%, echo was done today and reviewed showing EF 30% with mildly decreased RV systolic function. Suspect mixed ischemic/nonischemic cardiomyopathy. He seems to be markedly orthostatic with syncope. However, he is also volume overloaded with dilated IVC on echo ,wheezing, and JVD. He has had 500 cc NS today.  - For now, we will hold Coreg, Entresto, and  spironolactone. Follow BP.  - Lasix 40 mg IV x 1, then follow volume status and BP.  - With persistently low EF, should get ICD. Not CRT candidate.  2. CAD: Last cath  in 11/20 with occluded OM2 stent, 90% D1, severe diffuse disease in the RCA (no intervention). Occasional atypical chest pain. Doubt ACS.  - No ASA given apixaban use.  - Continue atorvastatin. Good lipids in 1/21.  - Cycle troponin  3. Atrial fibrillation: Paroxysmal. He is in NSR today. Denies overt bleeding.  - Continue apixaban 5 mg bid.  - Need CBC to make sure orthostasis is not related to anemia/blood loss.  4. DMII: Continue home regimen.  5. OSA: Continue CPAP.  6. Syncope: 3-4 episodes, suspect related to orthostasis. However, need to admit for full evaluation.  - Watch rhythm on telemetry.  - CBC to assess for anemia/evidence of blood loss.  - Would not give further IV fluid with volume overload (wheezing, JVD). Lasix 40 mg IV x 1 as above.  - Hold BP active meds and follow in hospital.  - As above, will need eventual ICD.  Loralie Champagne  04/05/2020  11:14 AM  CBC returned with hgb 4.2. Will be admitting, will tranfuse 2 units PRBCs, will need to hold apixaban. Will get Lasix in between units. Will need GI evaluation. Patient actually denies melena and BRBPR.    Talin Feister NP-C  1:17 PM

## 2020-04-05 NOTE — Consult Note (Addendum)
Referring Provider:  [facility]         Primary Care Physician:  Nolene Ebbs, MD Primary Gastroenterologist:  Althia Forts            We were asked to see this patient for:  anemia                ASSESSMENT /  PLAN    59 yo male with pmh significant for Afib, CAD / PCI, chronic systolic CHF, DM, HTN, sleep apnea, with CAP, depression, schizophrenia  # Recurrent , profound microcytic anemia.in setting of Eliquis / ASA  --hgb 4.2  --Baseline hgb 11-12 (? some degree of CKD) --No overt GI bleeding.  --I reviewed notes in Care Everywhere. He presented to [facility] June 2017 with same scenario  ( severe microcytic anemia hgb of 5 ). Though I cannot see actual reports, subsequent notes mention that EGD / colonoscopy were unrevealing. Hemolysis labs negative. He had a transverse colon polyp removed. Path c/w tubular adenoma.  Procedures done while patient was incarcerated and there hasn't been follow up.  --Likely iron deficient with low mcv but will check iron studies keeping in mind that he hasn't received blood.  --Will request EGD / colonoscopy reports from [facility] but patient will most likely require repeat EGD / colonoscopy to start. If negative then probable small bowel video capsule. Will need to wait for Eliquis to clear system. He had a dose this am.  --He is getting PRBC now, await follow up H+H.   # Chronic systolic CHF --EF 28%  HPI:    Chief Complaint: anemia  [name] is a 59 y.o. male with multiple medical problems as above. He was seen at Heart Failure clinic yesterday, complained of light-headedness. Had syncope episode in clinic. Labs remarkable for hgb of 4.2. Admitted for further workup. He is getting PRBC.  Eliquis is on hold but he took a dose this am prior to admission.   [name] says he has felt weak for about one month. He hasn't had any black stools or rectal bleeding. Except for one episode today he hasn't had any nausea / vomiting. He vomited  clear liquid today. No abdominal pain. He takes a daily baby asa, no other NSAIDS. Says BMs are normal. No FMH of colon cancer. He presented to [facility] in 2017 with same symptoms and hgb of 5. Underwent EGD / colonoscopy. Other than a transverse polyp, nothing found to account for the anemia. He hasn't had follow up there.   Patient's baseline hgb is 11-12. It is 4.2 today. MCV 69.   Past Medical History:  Diagnosis Date  . Anxiety   . CAD (coronary artery disease)   . CHF (congestive heart failure) ([place]) 09/2019  . Depression   . Diabetes mellitus   . Erectile dysfunction 11/2019  . H/O right heart catheterization 09/2019  . Hypertension   . Schizophrenia [facility])     Past Surgical History:  Procedure Laterality Date  . CORONARY STENT PLACEMENT    . HERNIA REPAIR    . RIGHT/LEFT HEART CATH AND CORONARY ANGIOGRAPHY N/A 10/14/2019   Procedure: RIGHT/LEFT HEART CATH AND CORONARY ANGIOGRAPHY;  Surgeon: Joel Crome, MD;  Location: [facility];  Service: Cardiovascular;  Laterality: N/A;    Prior to Admission medications   Medication Sig Start Date End Date Taking? Authorizing Provider  acetaminophen (TYLENOL) 500 MG tablet Take 500 mg by mouth every 6 (six) hours as needed for mild pain or moderate  pain.   Yes [provider]  apixaban (ELIQUIS) 5 MG TABS tablet Take 1 tablet (5 mg total) by mouth 2 (two) times daily. 11/24/19 04/05/20 Yes Turner, Eber Hong, MD  aspirin EC 81 MG tablet Take 81 mg by mouth daily.   Yes [provider]  atorvastatin (LIPITOR) 40 MG tablet Take 1 tablet (40 mg total) by mouth daily at 6 PM. 03/15/20 04/14/20 Yes Turner, Eber Hong, MD  Black Currant Seed Oil 500 MG CAPS Take 1 capsule by mouth daily. 123m   Yes [provider]  blood glucose meter kit and supplies KIT Dispense based on patient and insurance preference. Use up to four times daily as directed. (FOR ICD-9 250.00, 250.01). Patient taking differently: 1 each by Other  route See admin instructions. Dispense based on patient and insurance preference. Use up to four times daily as directed. (FOR ICD-9 250.00, 250.01). 10/16/19  Yes GBarb Merino MD  carvedilol (COREG) 12.5 MG tablet Take 1 tablet (12.5 mg total) by mouth 2 (two) times daily with a meal. 03/15/20 04/14/20 Yes Turner, TEber Hong MD  cyclobenzaprine (FLEXERIL) 10 MG tablet Take 1 tablet (10 mg total) by mouth 3 (three) times daily as needed for muscle spasms. 01/12/20  Yes SAzzie Glatter FNP  furosemide (LASIX) 40 MG tablet Take 1 tablet (40 mg total) by mouth 2 (two) times daily. 03/15/20 04/14/20 Yes Turner, TEber Hong MD  gabapentin (NEURONTIN) 300 MG capsule Take 2 capsules (600 mg = total), by mouth, 2 times daily. Patient taking differently: Take 600 mg by mouth 2 (two) times daily.  01/12/20  Yes SAzzie Glatter FNP  Insulin Lispro Prot & Lispro (HUMALOG MIX 75/25 KWIKPEN) (75-25) 100 UNIT/ML Kwikpen Inject 30 Units into the skin 2 (two) times daily. 02/18/20  Yes SAzzie Glatter FNP  Multiple Vitamins-Minerals (CENTRUM SILVER 50+MEN) TABS Take 1 tablet by mouth daily.   Yes [provider]  potassium chloride SA (KLOR-CON) 20 MEQ tablet Take 1 tablet (20 mEq total) by mouth 2 (two) times daily. 03/15/20  Yes Turner, TEber Hong MD  QUEtiapine (SEROQUEL) 100 MG tablet Take 1 tablet (100 mg total) by mouth at bedtime. Patient taking differently: Take 200 mg by mouth at bedtime.  12/02/19  Yes SAzzie Glatter FNP  sacubitril-valsartan (ENTRESTO) 49-51 MG Take 1 tablet by mouth 2 (two) times daily. 03/15/20  Yes Turner, TEber Hong MD  spironolactone (ALDACTONE) 25 MG tablet Take 1 tablet (25 mg total) by mouth daily. 03/15/20  Yes Turner, TEber Hong MD  tetrahydrozoline 0.05 % ophthalmic solution Place 1 drop into both eyes daily.   Yes [provider]  Turmeric 500 MG CAPS Take 500 mg by mouth daily.    Yes [provider]  UNABLE TO FIND Take 1 capsule by mouth daily. Med Name:  sea moss 10073mdaily    Yes [provider]  NOVOLOG MIX 70/30 FLEXPEN (70-30) 100 UNIT/ML FlexPen INJECT 30 UNITS INTO THE SKIN 2 (TWO) TIMES DAILY. Patient not taking: No sig reported 02/17/20   StAzzie GlatterFNP  sildenafil (VIAGRA) 100 MG tablet Take 1 tablet (100 mg total) by mouth daily as needed for erectile dysfunction. 12/16/19   McLarey DresserMD    Current Facility-Administered Medications  Medication Dose Route Frequency Provider Last Rate Last Admin  . 0.9 %  sodium chloride infusion  250 mL Intravenous PRN Clegg, [name] D, NP      . acetaminophen (TYLENOL) tablet 650 mg  650 mg Oral Q4H PRN Clegg, [name] D, NP      . atorvastatin (LIPITOR) tablet 40 mg  40 mg Oral q1800 Clegg, [name] D, NP      . furosemide (LASIX) injection 40 mg  40 mg Intravenous BID [name], MD      . ondansetron [facility]) injection 4 mg  4 mg Intravenous Q6H PRN Clegg, [name] D, NP      . pantoprazole (PROTONIX) injection 40 mg  40 mg Intravenous Q12H [name], MD   40 mg at 04/05/20 1412  . QUEtiapine (SEROQUEL) tablet 200 mg  200 mg Oral QHS Clegg, [name] D, NP      . sodium chloride flush (NS) 0.9 % injection 3 mL  3 mL Intravenous Q12H Clegg, [name] D, NP   3 mL at 04/05/20 1412  . sodium chloride flush (NS) 0.9 % injection 3 mL  3 mL Intravenous PRN Clegg, [name] D, NP        Allergies as of 04/05/2020  . (No Known Allergies)    Family History  Problem Relation Age of Onset  . Heart failure Mother   . Mental illness Sister   . Mental illness Sister     Social History   Socioeconomic History  . Marital status: Legally Separated    Spouse name: Not on file  . Number of children: Not on file  . Years of education: Not on file  . Highest education level: Not on file  Occupational History  . Not on file  Tobacco Use  . 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 Smokeless tobacco: Never Used  Substance and Sexual Activity  . Alcohol use: No  . Drug use: No  . Sexual activity: Yes    Other Topics Concern  . Not on file  Social History Narrative  . Not on file   Social Determinants of Health   Financial Resource Strain: Low Risk   . Difficulty of Paying Living Expenses: Not very hard  Food Insecurity: No Food Insecurity  . Worried About Charity fundraiser in the Last Year: Never true  . Ran Out of Food in the Last Year: Never true  Transportation Needs: Unmet Transportation Needs  . Lack of Transportation (Medical): Yes  . Lack of Transportation (Non-Medical): Yes  Physical Activity:   . Days of Exercise per Week:   . Minutes of Exercise per Session:   Stress:   . Feeling of Stress :   Social Connections:   . Frequency of Communication with Friends and Family:   . Frequency of Social Gatherings with Friends and Family:   . Attends Religious Services:   . Active Member of Clubs or Organizations:   . Attends Archivist Meetings:   [name] Kitchen Marital Status:   Intimate Partner Violence:   . Fear of Current or Ex-Partner:   . Emotionally Abused:   [name] Kitchen Physically Abused:   . Sexually Abused:     Review of Systems: All systems reviewed and negative except where noted in HPI.  Physical Exam: Vital signs in last 24 hours: Temp:  [97.8 F (36.6 C)-98.2 F (36.8 C)] 98.2 F (36.8 C) (05/10 1554) Pulse Rate:  [58-70] 70 (05/10 1554) Resp:  [15-20] 16 (05/10 1554) BP: (97-111)/(51-67) 108/67 (05/10 1554) SpO2:  [95 %-99 %] 97 % (05/10 1553) Weight:  [140.6 kg-145.5 kg] 145.5 kg (05/10 1254)   General:   Alert male in NAD Psych:  Pleasant, cooperative. Normal mood and affect. Eyes:  Pupils equal, sclera clear, no icterus.   Conjunctiva pink. Ears:  Normal auditory acuity. Nose:  No deformity, discharge,  or lesions. Neck:  Supple; no masses Lungs:  Occas wheeze, otherwise CTA. [name] Kitchen  Heart:  Regular rate and rhythm; + murmur, no lower extremity edema Abdomen:  Soft, protuberant, nontender, BS active, no palp mass   Rectal:  Deferred  Msk:  Symmetrical  without gross deformities. . Neurologic:  Alert and  oriented x4;  grossly normal neurologically. Skin:  Intact without significant lesions or rashes.   Intake/Output from previous day: No intake/output data recorded. Intake/Output this shift: Total I/O In: 315 [Blood:315] Out: 480 [Urine:480]  Lab Results: Recent Labs    04/05/20 1048  WBC 7.8  HGB 4.2*  HCT 16.5*  PLT 335   BMET Recent Labs    04/05/20 1048  NA 133*  K 5.0  CL 104  CO2 21*  GLUCOSE 133*  BUN 34*  CREATININE 1.62*  CALCIUM 8.5*   LFT Recent Labs    04/05/20 1048  PROT 6.8  ALBUMIN 3.1*  AST 24  ALT 26  ALKPHOS 60  BILITOT 0.8   PT/INR No results for input(s): LABPROT, INR in the last 72 hours. Hepatitis Panel No results for input(s): HEPBSAG, HCVAB, HEPAIGM, HEPBIGM in the last 72 hours.   . CBC Latest Ref Rng & Units 04/05/2020 12/02/2019 10/16/2019  WBC 4.0 - 10.5 K/uL 7.8 6.4 5.6  Hemoglobin 13.0 - 17.0 g/dL 4.2(LL) 11.3(L) 11.2(L)  Hematocrit 39.0 - 52.0 % 16.5(L) 36.1(L) 36.9(L)  Platelets 150 - 400 K/uL 335 306 322    . CMP Latest Ref Rng & Units 04/05/2020 03/15/2020 02/05/2020  Glucose 70 - 99 mg/dL 133(H) 221(H) 126(H)  BUN 6 - 20 mg/dL 34(H) 32(H) 20  Creatinine 0.61 - 1.24 mg/dL 1.62(H) 1.37(H) 1.34(H)  Sodium 135 - 145 mmol/L 133(L) 136 138  Potassium 3.5 - 5.1 mmol/L 5.0 5.0 4.2  Chloride 98 - 111 mmol/L 104 102 105  CO2 22 - 32 mmol/L 21(L) 20 25  Calcium 8.9 - 10.3 mg/dL 8.5(L) 8.8 8.6(L)  Total Protein 6.5 - 8.1 g/dL 6.8 - -  Total Bilirubin 0.3 - 1.2 mg/dL 0.8 - -  Alkaline Phos 38 - 126 U/L 60 - -  AST 15 - 41 U/L 24 - -  ALT 0 - 44 U/L 26 - -   Studies/Results: ECHOCARDIOGRAM COMPLETE  Result Date: 04/05/2020    ECHOCARDIOGRAM REPORT   Patient Name:   [name] Date of Exam: 04/05/2020 Medical Rec #:  [id number]            Height:       74.0 in Accession #:    [id number]           Weight:       309.0 lb Date of Birth:  1961-01-02            BSA:           2.615 m Patient Age:    19 years             BP:           84/60 mmHg Patient Gender: M                    HR:           58 bpm. Exam Location:  Outpatient Procedure: 2D Echo, Cardiac Doppler, Color Doppler and Intracardiac  Opacification Agent Indications:    M75.44 Chronic systolic (congestive) heart failure  History:        Patient has prior history of Echocardiogram examinations, most                 recent 10/12/2019. CAD; Risk Factors:Hypertension and Diabetes.  Sonographer:    Tiffany Dance Sonographer#2:  Roseanna Rainbow Referring Phys: [phone] [name] D CLEGG IMPRESSIONS  1. Left ventricular ejection fraction, by estimation, is 30%. The left ventricle has moderate to severely decreased function. The left ventricle demonstrates global hypokinesis. There is mild left ventricular hypertrophy. Left ventricular diastolic parameters are consistent with Grade II diastolic dysfunction (pseudonormalization).  2. Right ventricular systolic function is mildly reduced. The right ventricular size is mildly enlarged. There is mildly elevated pulmonary artery systolic pressure. The estimated right ventricular systolic pressure is 71.2 mmHg.  3. Left atrial size was mildly dilated.  4. Right atrial size was mildly dilated.  5. The mitral valve is normal in structure. Trivial mitral valve regurgitation. No evidence of mitral stenosis.  6. The aortic valve is tricuspid. Aortic valve regurgitation is not visualized. No aortic stenosis is present.  7. The inferior vena cava is dilated in size with <50% respiratory variability, suggesting right atrial pressure of 15 mmHg. FINDINGS  Left Ventricle: Left ventricular ejection fraction, by estimation, is 30%. The left ventricle has moderate to severely decreased function. The left ventricle demonstrates global hypokinesis. Definity contrast agent was given IV to delineate the left ventricular endocardial borders. The left ventricular internal cavity size was normal in size.  There is mild left ventricular hypertrophy. Left ventricular diastolic parameters are consistent with Grade II diastolic dysfunction (pseudonormalization). Right Ventricle: The right ventricular size is mildly enlarged. No increase in right ventricular wall thickness. Right ventricular systolic function is mildly reduced. There is mildly elevated pulmonary artery systolic pressure. The tricuspid regurgitant  velocity is 2.38 m/s, and with an assumed right atrial pressure of 15 mmHg, the estimated right ventricular systolic pressure is 19.7 mmHg. Left Atrium: Left atrial size was mildly dilated. Right Atrium: Right atrial size was mildly dilated. Pericardium: There is no evidence of pericardial effusion. Mitral Valve: The mitral valve is normal in structure. Trivial mitral valve regurgitation. No evidence of mitral valve stenosis. Tricuspid Valve: The tricuspid valve is normal in structure. Tricuspid valve regurgitation is trivial. Aortic Valve: The aortic valve is tricuspid. Aortic valve regurgitation is not visualized. No aortic stenosis is present. Pulmonic Valve: The pulmonic valve was normal in structure. Pulmonic valve regurgitation is not visualized. Aorta: The aortic root is normal in size and structure. Venous: The inferior vena cava is dilated in size with less than 50% respiratory variability, suggesting right atrial pressure of 15 mmHg. IAS/Shunts: No atrial level shunt detected by color flow Doppler.  LEFT VENTRICLE PLAX 2D LVIDd:         5.30 cm  Diastology LVIDs:         3.60 cm  LV e' lateral:   7.72 cm/s LV PW:         1.90 cm  LV E/e' lateral: 14.1 LV IVS:        1.30 cm  LV e' medial:    6.74 cm/s LVOT diam:     2.50 cm  LV E/e' medial:  16.2 LV SV:         85 LV SV Index:   32 LVOT Area:     4.91 cm  RIGHT VENTRICLE  IVC RV Basal diam:  4.70 cm     IVC diam: 2.80 cm RV Mid diam:    3.60 cm RV S prime:     10.70 cm/s TAPSE (M-mode): 2.7 cm LEFT ATRIUM              Index       RIGHT  ATRIUM           Index LA diam:        4.90 cm  1.87 cm/m  RA Area:     27.10 cm LA Vol (A2C):   78.7 ml  30.10 ml/m RA Volume:   94.30 ml  36.06 ml/m LA Vol (A4C):   100.0 ml 38.24 ml/m LA Biplane Vol: 88.0 ml  33.65 ml/m  AORTIC VALVE LVOT Vmax:   80.80 cm/s LVOT Vmean:  53.200 cm/s LVOT VTI:    0.173 m  AORTA Ao Root diam: 3.50 cm Ao Asc diam:  3.50 cm MITRAL VALVE                TRICUSPID VALVE MV Area (PHT): 2.80 cm     TR Peak grad:   22.7 mmHg MV Decel Time: 271 msec     TR Vmax:        238.00 cm/s MV E velocity: 109.00 cm/s MV A velocity: 67.40 cm/s   SHUNTS MV E/A ratio:  1.62         Systemic VTI:  0.17 m                             Systemic Diam: 2.50 cm Loralie Champagne MD Electronically signed by Loralie Champagne MD Signature Date/Time: 04/05/2020/10:50:11 AM    Final     Principal Problem:   Anemia Active Problems:   Acute on chronic systolic (congestive) heart failure (HCC)   PAF (paroxysmal atrial fibrillation) (HCC)   OSA on CPAP   Syncope and collapse    Tye Savoy, NP-C @  04/05/2020, 4:04 PM   I have reviewed the entire case in detail with the above APP and discussed the plan in detail.  Therefore, I agree with the diagnoses recorded above. In addition,  I have personally interviewed and examined the patient and have personally reviewed any abdominal/pelvic CT scan images.  My additional thoughts are as follows:  59 year old man with complex cardiac condition on anticoagulation admitted with profound microcytic anemia, presumed to be iron deficient.  Available records indicate something similar may have occurred at [facility] several years ago.  He is a limited historian, he does not think he is seen any rectal bleeding or black tarry stool lately.  He denies abdominal pain, nausea, loss of appetite, dysphagia vomiting or change in bowel habits.  He is currently receiving his first unit of PRBCs.  Admission history and physical of the cardiology service indicates  perhaps they thought he was in some volume overload.  Some of this anemia may be from CKD, but of course the degree of anemia raises concern for chronic occult GI blood loss.  Our tentative plan is a colonoscopy and small bowel enteroscopy the day after tomorrow, giving time for Eliquis to get out of his system and for adequate transfusion and stabilization of his volume status.  If those studies are negative, video capsule study would be necessary to follow.  I had an initial discussion with him about these procedures and he was agreeable after discussion of procedure and risks.  The benefits and risks of the planned procedure were described in detail with the patient or (when appropriate) their health care proxy.  Risks were outlined as including, but not limited to, bleeding, infection, perforation, adverse medication reaction leading to cardiac or pulmonary decompensation, pancreatitis (if ERCP).  The limitation of incomplete mucosal visualization was also discussed.  No guarantees or warranties were given.  Patient at increased risk for cardiopulmonary complications of procedure due to medical comorbidities.  (CAD, CHF, anticoagulation, morbid obesity, sleep apnea)  We will check on him tomorrow.  [name] Office:[phone]

## 2020-04-05 NOTE — Progress Notes (Addendum)
PCP: Nolene Ebbs, MD Cardiology: Dr. Radford Pax HF Cardiology: Dr. Aundra Dubin  59 yo with history of chronic systolic CHF, CAD, type 2 diabetes, paroxysmal atrial fibrillation, and schizophrenia was referred by Dr. Radford Pax for evaluation of CHF.  Patient had OM2 PCI in 2012.  In 11/20, he was admitted with CHF. Echo showed EF 25-30% with diffuse hypokinesis.  LHC was done, showing occluded OM2 at prior stent, 90% D1 stenosis, and extensive diffuse RCA disease.  No intervention.  He was thought to be in paroxysmal atrial fibrillation during this appointment and apixaban was started.   He does not smoke, rarely drinks, and does not use drugs.  His mother had "heart problems."    He can write his name only. He is only able to read a few words. Thinks he completed the 7th grade.    Patients returns today for followup of CHF. He reports 1 week of poor balance and lightheadedness with standing.  He "fell out" 2-3 times, does not remember for sure.  Says he lost consciousness completely after standing.  Main issue is dizziness/lightheadedness, not particularly short of breath.  He gets rare atypical chest pain.  He denies BRBPR/melena.  Says he is taking all his medicine (has paramedicine following but not here today).  No palpitations. Using CPAP.  Says he drank "1/2 of a fifth" of liquor this weekend, but has not drunk otherwise.  No drugs.  Used sildenafil 3 days ago.  Came in today for echo, passed out when walking from wheelchair to bed, SBP in 90s-100s when supine.  Still lightheaded when he sits up.  He was given 500 cc NS in the echo lab.   Echo was done and reviewed, EF 30% with diffuse hypokinesis, mild LVH, PASP 38, mildly decreased RV systolic function, IVC dilated.   Social History --->Says he can read just a little bit. He can write his name only. Says he has a hard time reading medication labels. He doesn't know how to take a 1/2 tablet. Has transportation issues.  Lives with his sister.   Labs  (1/21): LDL 66, HDL 42, hgb 11.3, K 4.7, creatinine 1.32 Labs (4/21): K 5, creatinine 1.37  ECG (personally reviewed): NSR, nonspecific inferior TWF  PMH:  1. Atrial fibrillation: Paroxysmal 2. Type 2 diabetes 3. HTN 4. Hyperlipidemia 5. Schizophrenia 6. CAD: PCI OM2 in 2012.  - LHC (11/20): 90% D1 stenosis, totally occluded OM2 at stent, serial 85%/70%/60% RCA stenoses.  7. 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   - Echo (11/20): EF 25-30%, global hypokinesis.  - Echo (5/21): EF 30% with diffuse hypokinesis, mild LVH, PASP 38, mildly decreased RV systolic function, IVC dilated.  Social History   Socioeconomic History  . Marital status: Legally Separated    Spouse name: Not on file  . Number of children: Not on file  . Years of education: Not on file  . Highest education level: Not on file  Occupational History  . Not on file  Tobacco Use  . 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 Smokeless tobacco: Never Used  Substance and Sexual Activity  . Alcohol use: No  . Drug use: No  . Sexual activity: Yes  Other Topics Concern  . Not on file  Social History Narrative  . Not on file   Social Determinants of Health   Financial Resource Strain: Low Risk   . Difficulty of Paying Living Expenses: Not very hard  Food Insecurity: No Food Insecurity  . Worried About Estate manager/land agent  of Food in the Last Year: Never true  . Ran Out of Food in the Last Year: Never true  Transportation Needs: Unmet Transportation Needs  . Lack of Transportation (Medical): Yes  . Lack of Transportation (Non-Medical): Yes  Physical Activity:   . Days of Exercise per Week:   . Minutes of Exercise per Session:   Stress:   . Feeling of Stress :   Social Connections:   . Frequency of Communication with Friends and Family:   . Frequency of Social Gatherings with Friends and Family:   . Attends Religious Services:   . Active Member of Clubs or Organizations:   . Attends Theatre manager Meetings:   [name] Marital Status:   Intimate Partner Violence:   . Fear of Current or Ex-Partner:   . Emotionally Abused:   [name] Physically Abused:   . Sexually Abused:    Family History  Problem Relation Age of Onset  . Heart failure Mother   . Mental illness Sister   . Mental illness Sister    ROS: All systems reviewed and negative except as per HPI.  Current Meds  Medication Sig  . acetaminophen (TYLENOL) 500 MG tablet Take 500 mg by mouth every 6 (six) hours as needed for mild pain or moderate pain.  [name] apixaban (ELIQUIS) 5 MG TABS tablet Take 1 tablet (5 mg total) by mouth 2 (two) times daily.  [name] aspirin EC 81 MG tablet Take 81 mg by mouth daily.  [name] atorvastatin (LIPITOR) 40 MG tablet Take 1 tablet (40 mg total) by mouth daily at 6 PM.  . Black Currant Seed Oil 500 MG CAPS Take 1 capsule by mouth daily.  . blood glucose meter kit and supplies KIT Dispense based on patient and insurance preference. Use up to four times daily as directed. (FOR ICD-9 250.00, 250.01).  . carvedilol (COREG) 12.5 MG tablet Take 1 tablet (12.5 mg total) by mouth 2 (two) times daily with a meal.  . cyclobenzaprine (FLEXERIL) 10 MG tablet Take 1 tablet (10 mg total) by mouth 3 (three) times daily as needed for muscle spasms.  . furosemide (LASIX) 40 MG tablet Take 1 tablet (40 mg total) by mouth 2 (two) times daily.  [name] gabapentin (NEURONTIN) 300 MG capsule Take 2 capsules (600 mg = total), by mouth, 2 times daily.  . Insulin Lispro Prot & Lispro (HUMALOG MIX 75/25 KWIKPEN) (75-25) 100 UNIT/ML Kwikpen Inject 30 Units into the skin 2 (two) times daily.  . Multiple Vitamin (MULTIVITAMIN) tablet Take 1 tablet by mouth daily.  [name] NOVOLOG MIX 70/30 FLEXPEN (70-30) 100 UNIT/ML FlexPen INJECT 30 UNITS INTO THE SKIN 2 (TWO) TIMES DAILY.  [name] potassium chloride SA (KLOR-CON) 20 MEQ tablet Take 1 tablet (20 mEq total) by mouth 2 (two) times daily.  . QUEtiapine (SEROQUEL) 100 MG tablet Take 1 tablet (100 mg total)  by mouth at bedtime. (Patient taking differently: Take 200 mg by mouth at bedtime. )  . sacubitril-valsartan (ENTRESTO) 49-51 MG Take 1 tablet by mouth 2 (two) times daily.  . sildenafil (VIAGRA) 100 MG tablet Take 1 tablet (100 mg total) by mouth daily as needed for erectile dysfunction.  [name] spironolactone (ALDACTONE) 25 MG tablet Take 1 tablet (25 mg total) by mouth daily.  . Turmeric 500 MG CAPS Take by mouth.  [name] UNABLE TO FIND 1 capsule. Med Name: sea moss '1000mg'$  daily   BP 108/67   Pulse (!) 58   Wt (!) 140.6 kg (310 lb) Comment:  pt reported, unable to stand  SpO2 99%   BMI 39.80 kg/m   Wt Readings from Last 3 Encounters:  04/05/20 (!) 140.6 kg (310 lb)  03/29/20 (!) 140.2 kg (309 lb)  03/15/20 (!) 139.4 kg (307 lb 6.4 oz)   .med Vitals:   04/05/20 1031 04/05/20 1050  BP: (!) 97/57 108/67  Pulse: 62 (!) 58  SpO2: 99%    General: NAD Neck: Thick, JVP 14 cm, no thyromegaly or thyroid nodule.  Lungs: End expiratory wheezes.  CV: Nondisplaced PMI.  Heart regular S1/S2, no S3/S4, no murmur.  1+ ankle edema.  No carotid bruit.  Normal pedal pulses.  Abdomen: Soft, nontender, no hepatosplenomegaly, no distention.  Skin: Intact without lesions or rashes.  Neurologic: Alert and oriented x 3.  Psych: Normal affect. Extremities: No clubbing or cyanosis.  HEENT: Normal.   Assessment/Plan:  I am going to admit him today with syncope and orthostasis.  1. Acute on chronic systolic CHF: Echo in 22/63 with EF 25-30%, echo was done today and reviewed showing EF 30% with mildly decreased RV systolic function.  Suspect mixed ischemic/nonischemic cardiomyopathy.  He seems to be markedly orthostatic with syncope.  However, he is also volume overloaded with dilated IVC on echo ,wheezing, and JVD.  He has had 500 cc NS today.  - For now, we will hold Coreg, Entresto, and spironolactone.  Follow BP.  - Lasix 40 mg IV x 1, then follow volume status and BP.  - With persistently low EF, should get  ICD. Not CRT candidate.  2. CAD: Last cath in 11/20 with occluded OM2 stent, 90% D1, severe diffuse disease in the RCA (no intervention). Occasional atypical chest pain.  Doubt ACS.   - No ASA given apixaban use.  - Continue atorvastatin. Good lipids in 1/21.  - Cycle troponin  3. Atrial fibrillation: Paroxysmal.  He is in NSR today. Denies overt bleeding.  - Continue apixaban 5 mg bid.  - Need CBC to make sure orthostasis is not related to anemia/blood loss.  4. DMII: Continue home regimen.  5. OSA: Continue CPAP.  6. Syncope: 3-4 episodes, suspect related to orthostasis.  However, need to admit for full evaluation.   - Watch rhythm on telemetry.  - CBC to assess for anemia/evidence of blood loss.  - Would not give further IV fluid with volume overload (wheezing, JVD).  Lasix 40 mg IV x 1 as above.  - Hold BP active meds and follow in hospital.  - As above, will need eventual ICD.   Loralie Champagne 04/05/2020 11:14 AM  CBC returned with hgb 4.2.  Will be admitting, will tranfuse 2 units PRBCs, will need to hold apixaban. Will get Lasix in between units.  Will need GI evaluation.  Patient actually denies melena and BRBPR.   Loralie Champagne 04/05/2020 1:04 PM

## 2020-04-05 NOTE — Progress Notes (Signed)
Report given to RN Ava on 3E. Pt taken to unit via stretcher belongings with patient.

## 2020-04-05 NOTE — Progress Notes (Signed)
B/p at 0935 84/62-NSR HR 62   CBG at [id number] fasting with 30 units of insulin administered at home.   IV placed by VAD RN Judson Roch.  Per Dr Doylene Canard .5 Liter NS administered. PA Simmons at bedside to assess patient. No further interventions at this time. MD Mclean notified. Pt to continue with echo. Will have MD appointment following ECHO.

## 2020-04-05 NOTE — Progress Notes (Signed)
Pt MD, pt for direct admit. Awaiting bed. Pt resting, sleeping intermittently on stretcher on monitor in room.

## 2020-04-05 NOTE — Plan of Care (Signed)

## 2020-04-05 NOTE — Progress Notes (Signed)
  Echocardiogram 2D Echocardiogram has been performed.  Magnus Crescenzo G Bricyn Labrada 04/05/2020, 10:31 AM

## 2020-04-05 NOTE — Progress Notes (Signed)
Critical Hemoglobin resulted 4.8. Result incorrect lab drawn 30 minutes post first  Blood transfusion. RN will reorder post transfusion H&H when second unit is finished infusing.

## 2020-04-06 ENCOUNTER — Inpatient Hospital Stay (HOSPITAL_COMMUNITY): Payer: Medicare Other

## 2020-04-06 DIAGNOSIS — Z9989 Dependence on other enabling machines and devices: Secondary | ICD-10-CM

## 2020-04-06 DIAGNOSIS — G4733 Obstructive sleep apnea (adult) (pediatric): Secondary | ICD-10-CM

## 2020-04-06 DIAGNOSIS — R55 Syncope and collapse: Secondary | ICD-10-CM

## 2020-04-06 DIAGNOSIS — D62 Acute posthemorrhagic anemia: Secondary | ICD-10-CM

## 2020-04-06 LAB — CBC WITH DIFFERENTIAL/PLATELET
Abs Immature Granulocytes: 0.02 10*3/uL (ref 0.00–0.07)
Basophils Absolute: 0 10*3/uL (ref 0.0–0.1)
Basophils Relative: 0 %
Eosinophils Absolute: 0.2 10*3/uL (ref 0.0–0.5)
Eosinophils Relative: 3 %
HCT: 18.8 % — ABNORMAL LOW (ref 39.0–52.0)
Hemoglobin: 5.3 g/dL — CL (ref 13.0–17.0)
Immature Granulocytes: 0 %
Lymphocytes Relative: 21 %
Lymphs Abs: 1.4 10*3/uL (ref 0.7–4.0)
MCH: 19.7 pg — ABNORMAL LOW (ref 26.0–34.0)
MCHC: 28.2 g/dL — ABNORMAL LOW (ref 30.0–36.0)
MCV: 69.9 fL — ABNORMAL LOW (ref 80.0–100.0)
Monocytes Absolute: 0.7 10*3/uL (ref 0.1–1.0)
Monocytes Relative: 10 %
Neutro Abs: 4.4 10*3/uL (ref 1.7–7.7)
Neutrophils Relative %: 66 %
Platelets: 340 10*3/uL (ref 150–400)
RBC: 2.69 MIL/uL — ABNORMAL LOW (ref 4.22–5.81)
RDW: 20 % — ABNORMAL HIGH (ref 11.5–15.5)
WBC: 6.8 10*3/uL (ref 4.0–10.5)
nRBC: 1.5 % — ABNORMAL HIGH (ref 0.0–0.2)

## 2020-04-06 LAB — CBC
HCT: 23.8 % — ABNORMAL LOW (ref 39.0–52.0)
HCT: 25.4 % — ABNORMAL LOW (ref 39.0–52.0)
Hemoglobin: 7.2 g/dL — ABNORMAL LOW (ref 13.0–17.0)
Hemoglobin: 7.6 g/dL — ABNORMAL LOW (ref 13.0–17.0)
MCH: 22.3 pg — ABNORMAL LOW (ref 26.0–34.0)
MCH: 22.2 pg — ABNORMAL LOW (ref 26.0–34.0)
MCHC: 30.3 g/dL (ref 30.0–36.0)
MCHC: 29.9 g/dL — ABNORMAL LOW (ref 30.0–36.0)
MCV: 73.7 fL — ABNORMAL LOW (ref 80.0–100.0)
MCV: 74.3 fL — ABNORMAL LOW (ref 80.0–100.0)
Platelets: 315 10*3/uL (ref 150–400)
Platelets: 318 10*3/uL (ref 150–400)
RBC: 3.23 MIL/uL — ABNORMAL LOW (ref 4.22–5.81)
RBC: 3.42 MIL/uL — ABNORMAL LOW (ref 4.22–5.81)
RDW: 20.2 % — ABNORMAL HIGH (ref 11.5–15.5)
RDW: 20 % — ABNORMAL HIGH (ref 11.5–15.5)
WBC: 7.3 10*3/uL (ref 4.0–10.5)
WBC: 8.2 10*3/uL (ref 4.0–10.5)
nRBC: 0.8 % — ABNORMAL HIGH (ref 0.0–0.2)
nRBC: 0.5 % — ABNORMAL HIGH (ref 0.0–0.2)

## 2020-04-06 LAB — BASIC METABOLIC PANEL
Anion gap: 9 (ref 5–15)
BUN: 33 mg/dL — ABNORMAL HIGH (ref 6–20)
CO2: 21 mmol/L — ABNORMAL LOW (ref 22–32)
Calcium: 8.4 mg/dL — ABNORMAL LOW (ref 8.9–10.3)
Chloride: 105 mmol/L (ref 98–111)
Creatinine, Ser: 1.59 mg/dL — ABNORMAL HIGH (ref 0.61–1.24)
GFR calc Af Amer: 54 mL/min — ABNORMAL LOW (ref >60)
GFR calc non Af Amer: 47 mL/min — ABNORMAL LOW (ref >60)
Glucose, Bld: 178 mg/dL — ABNORMAL HIGH (ref 70–99)
Potassium: 4.8 mmol/L (ref 3.5–5.1)
Sodium: 135 mmol/L (ref 135–145)

## 2020-04-06 LAB — GLUCOSE, CAPILLARY
Glucose-Capillary: 126 mg/dL — ABNORMAL HIGH (ref 70–99)
Glucose-Capillary: 174 mg/dL — ABNORMAL HIGH (ref 70–99)
Glucose-Capillary: 177 mg/dL — ABNORMAL HIGH (ref 70–99)
Glucose-Capillary: 176 mg/dL — ABNORMAL HIGH (ref 70–99)

## 2020-04-06 LAB — PREPARE RBC (CROSSMATCH)

## 2020-04-06 LAB — TROPONIN I (HIGH SENSITIVITY)
Troponin I (High Sensitivity): 165 ng/L (ref <18)
Troponin I (High Sensitivity): 182 ng/L (ref <18)

## 2020-04-06 LAB — HIV ANTIBODY (ROUTINE TESTING W REFLEX): HIV Screen 4th Generation wRfx: NONREACTIVE

## 2020-04-06 LAB — OCCULT BLOOD X 1 CARD TO LAB, STOOL: Fecal Occult Bld: POSITIVE — AB

## 2020-04-06 MED ORDER — SODIUM CHLORIDE 0.9% IV SOLUTION: Freq: Once | INTRAVENOUS | Status: AC

## 2020-04-06 NOTE — Progress Notes (Addendum)
Daily Rounding Note  04/06/2020, 10:34 AM  LOS: 1 day   SUBJECTIVE:   Chief complaint: Acute blood loss anemia Has received a third and is about to receive another unit of PRBCs.  Overall he feels better.  Does not feel short of breath this morning.  On clears, no nausea, vomiting, abdominal pain  Denies chest pain  OBJECTIVE:         Vital signs in last 24 hours:    Temp:  [97.8 F (36.6 C)-99.1 F (37.3 C)] 98.8 F (37.1 C) (05/11 0734) Pulse Rate:  [56-84] 65 (05/11 0734) Resp:  [9-21] 17 (05/11 0734) BP: (95-123)/(38-95) 123/71 (05/11 0734) SpO2:  [93 %-100 %] 100 % (05/11 0734) Weight:  [142.7 kg-145.5 kg] 142.7 kg (05/11 0003) Last BM Date: 04/05/20 Filed Weights   04/05/20 1254 04/06/20 0003  Weight: (!) 145.5 kg (!) 142.7 kg   General: Obese, comfortable, nonacutely ill-appearing Heart: RRR Chest: No labored breathing, no cough.  Lungs clear. Abdomen: Obese without tenderness.  No masses, HSM, bruits, hernias.  Active bowel sounds. Extremities: No CCE. Neuro/Psych: Appropriate.  Alert and oriented x3.  No gross tremors or deficits.  Cooperative and pleasant.  Intake/Output from previous day: 05/10 0701 - 05/11 0700 In: 2649.2 [P.O.:1020; I.V.:305; Blood:1324.2] Out: 2415 [Urine:2415]  Intake/Output this shift: Total I/O In: 480 [P.O.:480] Out: 1280 [Urine:1280]  Lab Results: Recent Labs    04/05/20 1048 04/05/20 1951 04/06/20 0036  WBC 7.8 7.0 6.8  HGB 4.2* 4.8* 5.3*  HCT 16.5* 17.5* 18.8*  PLT 335 330 340   BMET Recent Labs    04/05/20 1048 04/06/20 0036  NA 133* 135  K 5.0 4.8  CL 104 105  CO2 21* 21*  GLUCOSE 133* 178*  BUN 34* 33*  CREATININE 1.62* 1.59*  CALCIUM 8.5* 8.4*   LFT Recent Labs    04/05/20 1048  PROT 6.8  ALBUMIN 3.1*  AST 24  ALT 26  ALKPHOS 60  BILITOT 0.8   PT/INR No results for input(s): LABPROT, INR in the last 72 hours. Hepatitis Panel No  results for input(s): HEPBSAG, HCVAB, HEPAIGM, HEPBIGM in the last 72 hours.  Studies/Results: DG Chest Port 1 View  Result Date: 04/06/2020 CLINICAL DATA:  Dyspnea. EXAM: PORTABLE CHEST 1 VIEW COMPARISON:  CTA chest and chest x-ray dated October 11, 2019. FINDINGS: Stable cardiomegaly. Pulmonary vascular congestion and mild diffuse interstitial thickening. No focal consolidation, pleural effusion, or pneumothorax. No acute osseous abnormality. IMPRESSION: 1. Mild interstitial pulmonary edema. Electronically Signed   By: Titus Dubin M.D.   On: 04/06/2020 07:30   ECHOCARDIOGRAM COMPLETE  Result Date: 04/05/2020    ECHOCARDIOGRAM REPORT   Patient Name:   [name] Date of Exam: 04/05/2020 Medical Rec #:  [id number]            Height:       74.0 in Accession #:    [id number]           Weight:       309.0 lb Date of Birth:  [birth year]            BSA:          2.615 m Patient Age:    59 years             BP:           84/60 mmHg Patient Gender: M  HR:           58 bpm. Exam Location:  Outpatient Procedure: 2D Echo, Cardiac Doppler, Color Doppler and Intracardiac            Opacification Agent Indications:    [id number] Chronic systolic (congestive) heart failure  History:        Patient has prior history of Echocardiogram examinations, most                 recent 10/12/2019. CAD; Risk Factors:Hypertension and Diabetes.  Sonographer:    Tiffany Dance Sonographer#2:  Roseanna Rainbow Referring Phys: [phone] [name] D CLEGG IMPRESSIONS  1. Left ventricular ejection fraction, by estimation, is 30%. The left ventricle has moderate to severely decreased function. The left ventricle demonstrates global hypokinesis. There is mild left ventricular hypertrophy. Left ventricular diastolic parameters are consistent with Grade II diastolic dysfunction (pseudonormalization).  2. Right ventricular systolic function is mildly reduced. The right ventricular size is mildly enlarged. There is mildly elevated  pulmonary artery systolic pressure. The estimated right ventricular systolic pressure is 0000000 mmHg.  3. Left atrial size was mildly dilated.  4. Right atrial size was mildly dilated.  5. The mitral valve is normal in structure. Trivial mitral valve regurgitation. No evidence of mitral stenosis.  6. The aortic valve is tricuspid. Aortic valve regurgitation is not visualized. No aortic stenosis is present.  7. The inferior vena cava is dilated in size with <50% respiratory variability, suggesting right atrial pressure of 15 mmHg. FINDINGS  Left Ventricle: Left ventricular ejection fraction, by estimation, is 30%. The left ventricle has moderate to severely decreased function. The left ventricle demonstrates global hypokinesis. Definity contrast agent was given IV to delineate the left ventricular endocardial borders. The left ventricular internal cavity size was normal in size. There is mild left ventricular hypertrophy. Left ventricular diastolic parameters are consistent with Grade II diastolic dysfunction (pseudonormalization). Right Ventricle: The right ventricular size is mildly enlarged. No increase in right ventricular wall thickness. Right ventricular systolic function is mildly reduced. There is mildly elevated pulmonary artery systolic pressure. The tricuspid regurgitant  velocity is 2.38 m/s, and with an assumed right atrial pressure of 15 mmHg, the estimated right ventricular systolic pressure is 0000000 mmHg. Left Atrium: Left atrial size was mildly dilated. Right Atrium: Right atrial size was mildly dilated. Pericardium: There is no evidence of pericardial effusion. Mitral Valve: The mitral valve is normal in structure. Trivial mitral valve regurgitation. No evidence of mitral valve stenosis. Tricuspid Valve: The tricuspid valve is normal in structure. Tricuspid valve regurgitation is trivial. Aortic Valve: The aortic valve is tricuspid. Aortic valve regurgitation is not visualized. No aortic stenosis is  present. Pulmonic Valve: The pulmonic valve was normal in structure. Pulmonic valve regurgitation is not visualized. Aorta: The aortic root is normal in size and structure. Venous: The inferior vena cava is dilated in size with less than 50% respiratory variability, suggesting right atrial pressure of 15 mmHg. IAS/Shunts: No atrial level shunt detected by color flow Doppler.  LEFT VENTRICLE PLAX 2D LVIDd:         5.30 cm  Diastology LVIDs:         3.60 cm  LV e' lateral:   7.72 cm/s LV PW:         1.90 cm  LV E/e' lateral: 14.1 LV IVS:        1.30 cm  LV e' medial:    6.74 cm/s LVOT diam:     2.50 cm  LV  E/e' medial:  16.2 LV SV:         85 LV SV Index:   32 LVOT Area:     4.91 cm  RIGHT VENTRICLE             IVC RV Basal diam:  4.70 cm     IVC diam: 2.80 cm RV Mid diam:    3.60 cm RV S prime:     10.70 cm/s TAPSE (M-mode): 2.7 cm LEFT ATRIUM              Index       RIGHT ATRIUM           Index LA diam:        4.90 cm  1.87 cm/m  RA Area:     27.10 cm LA Vol (A2C):   78.7 ml  30.10 ml/m RA Volume:   94.30 ml  36.06 ml/m LA Vol (A4C):   100.0 ml 38.24 ml/m LA Biplane Vol: 88.0 ml  33.65 ml/m  AORTIC VALVE LVOT Vmax:   80.80 cm/s LVOT Vmean:  53.200 cm/s LVOT VTI:    0.173 m  AORTA Ao Root diam: 3.50 cm Ao Asc diam:  3.50 cm MITRAL VALVE                TRICUSPID VALVE MV Area (PHT): 2.80 cm     TR Peak grad:   22.7 mmHg MV Decel Time: 271 msec     TR Vmax:        238.00 cm/s MV E velocity: 109.00 cm/s MV A velocity: 67.40 cm/s   SHUNTS MV E/A ratio:  1.62         Systemic VTI:  0.17 m                             Systemic Diam: 2.50 cm Loralie Champagne MD Electronically signed by Loralie Champagne MD Signature Date/Time: 04/05/2020/10:50:11 AM    Final     ASSESMENT:   *    Microcytic anemia in setting of Eliquis, ASA. Unrevealing EGD/colonoscopy for same in 04/2016.  Tubular adenoma removed from colon.   Hgb 4.2 >> 2 PRBCs >> 5.8 >> 3rd completed, 4th unit pndg.     FOBT status yet to be determined.   *     Systolic CHF.  EF 30%. Grade 2 DD.   Previous cardiac stent.  *    Paroxysmal A. Fib.  Eliquis initiated 08/2019.  *    OSA.  *    Social issues including functional illiteracy, transportation issues, schizophrenia..  *   IDDM.  PLAN   *    Waiting on Eliquis washout for possible enteroscopy/colonoscopy tomorrow.  Dr. Loletha Carrow will make the final decision after rounding on patient this afternoon.  In the meantime leave patient on clear liquids.    Azucena Freed  04/06/2020, 10:34 AM Phone [phone]  I have discussed the case with the PA, and that is the plan I formulated. I personally interviewed and examined the patient.  He has not had a bowel movement since admission, and certainly has not passed any blood since admission.  He may have had melena prehospital.  His hemoglobin did not improve his much as I would have expected and hoped for with transfusion, and he is getting more blood today.  Given his CHF, demand ischemia and overall medical condition, that must be done slowly so as to avoid pulmonary  edema.  As such, and given his other comorbidities of obesity and sleep apnea, I will delay his procedures until at least the day after tomorrow.  Given how he progresses, I might also start with just an upper endoscopy (small bowel enteroscopy) rather than a prolonged session of sedation for both EGD and colonoscopy.  All discussed with patient and his nurse.  Diet ordered for today.  We will reevaluate tomorrow and make a firm plan for endoscopy.  [name] Office: [phone]

## 2020-04-06 NOTE — Progress Notes (Addendum)
Advanced Heart Failure Rounding Note  PCP-Cardiologist: [name], MD  AHF: Dr. Aundra Dubin   Patient Profile   59 yo obese male with history of chronic systolic CHF, CAD, type 2 diabetes, paroxysmal atrial fibrillation, on chronic anticoagulation therapy w/ Eliquis, h/o severe anemia in 2017 treated at [facility] and schizophrenia, admitted for syncope and orthostasis and found to have severe anemia w/ hgb of 4.2.    Subjective:    Received 2 units PRBCs yesterday, Hgb 4.2>>4.8>>5.3.   Received an additional unit of blood overnight. Has order for another unit of blood now. Getting IV Lasix 40 mg bid.    BP 123/95.   GI following. Plan for EGD/ colonoscopy tomorrow.   SCr 1.62>>1.59 (baseline ~1.3).  He is feeling better. Denies dizziness currently. No recurrent syncope/ near syncope. Denies CP. No dyspnea. Also denies melena/ hematochezia. FOBT ordered but not collected.    Objective:   Weight Range: (!) 142.7 kg Body mass index is 40.41 kg/m.   Vital Signs:   Temp:  [97.8 F (36.6 C)-99.1 F (37.3 C)] 98.6 F (37 C) (05/11 0630) Pulse Rate:  [56-84] 56 (05/11 0634) Resp:  [9-21] 12 (05/11 0634) BP: (95-123)/(38-95) 123/95 (05/11 0634) SpO2:  [93 %-99 %] 99 % (05/11 0634) Weight:  [142.7 kg-145.5 kg] 142.7 kg (05/11 0003) Last BM Date: 04/05/20  Weight change: Filed Weights   04/05/20 1254 04/06/20 0003  Weight: (!) 145.5 kg (!) 142.7 kg    Intake/Output:   Intake/Output Summary (Last 24 hours) at 04/06/2020 0718 Last data filed at 04/06/2020 [id number] Gross per 24 hour  Intake 2649.17 ml  Output 2415 ml  Net 234.17 ml      Physical Exam    General:  Well appearing, obese [name]. No resp difficulty HEENT: Normal Neck: Supple. JVP elevated to jaw . Carotids 2+ bilat; no bruits. No lymphadenopathy or thyromegaly appreciated. Cor: PMI nondisplaced. Regular rate & rhythm. No rubs, gallops or murmurs. Lungs: Clear Abdomen: obese, Soft, nontender, nondistended. No  hepatosplenomegaly. No bruits or masses. Good bowel sounds. Extremities: No cyanosis, clubbing, rash, edema Neuro: Alert & orientedx3, cranial nerves grossly intact. moves all 4 extremities w/o difficulty. Affect pleasant   Telemetry   NSR, 90s. 8 beat run of NSVT   Labs    CBC Recent Labs    04/05/20 1951 04/06/20 0036  WBC 7.0 6.8  NEUTROABS  --  4.4  HGB 4.8* 5.3*  HCT 17.5* 18.8*  MCV 70.9* 69.9*  PLT 330 [id number]   Basic Metabolic Panel Recent Labs    04/05/20 1048 04/05/20 1429 04/06/20 0036  NA 133*  --  135  K 5.0  --  4.8  CL 104  --  105  CO2 21*  --  21*  GLUCOSE 133*  --  178*  BUN 34*  --  33*  CREATININE 1.62*  --  1.59*  CALCIUM 8.5*  --  8.4*  MG  --  2.3  --    Liver Function Tests Recent Labs    04/05/20 1048  AST 24  ALT 26  ALKPHOS 60  BILITOT 0.8  PROT 6.8  ALBUMIN 3.1*   No results for input(s): LIPASE, AMYLASE in the last 72 hours. Cardiac Enzymes No results for input(s): CKTOTAL, CKMB, CKMBINDEX, TROPONINI in the last 72 hours.  BNP: BNP (last 3 results) Recent Labs    10/11/19 1603 12/16/19 1108 12/23/19 1113  BNP 1,202.2* 1,878.9* 1,322.6*    ProBNP (last 3 results) Recent Labs  11/03/19 1647 03/15/20 0832  PROBNP 6,177* 231*     D-Dimer No results for input(s): DDIMER in the last 72 hours. Hemoglobin A1C No results for input(s): HGBA1C in the last 72 hours. Fasting Lipid Panel No results for input(s): CHOL, HDL, LDLCALC, TRIG, CHOLHDL, LDLDIRECT in the last 72 hours. Thyroid Function Tests No results for input(s): TSH, T4TOTAL, T3FREE, THYROIDAB in the last 72 hours.  Invalid input(s): FREET3  Other results:   Imaging    ECHOCARDIOGRAM COMPLETE  Result Date: 04/05/2020    ECHOCARDIOGRAM REPORT   Patient Name:   [name] Date of Exam: 04/05/2020 Medical Rec #:  [id number]            Height:       74.0 in Accession #:    [id number]           Weight:       309.0 lb Date of Birth:  May 18, 1961             BSA:          2.615 m Patient Age:    52 years             BP:           84/60 mmHg Patient Gender: M                    HR:           58 bpm. Exam Location:  Outpatient Procedure: 2D Echo, Cardiac Doppler, Color Doppler and Intracardiac            Opacification Agent Indications:    [id number] Chronic systolic (congestive) heart failure  History:        Patient has prior history of Echocardiogram examinations, most                 recent 10/12/2019. CAD; Risk Factors:Hypertension and Diabetes.  Sonographer:    Tiffany Dance Sonographer#2:  Roseanna Rainbow Referring Phys: [phone] [name] D CLEGG IMPRESSIONS  1. Left ventricular ejection fraction, by estimation, is 30%. The left ventricle has moderate to severely decreased function. The left ventricle demonstrates global hypokinesis. There is mild left ventricular hypertrophy. Left ventricular diastolic parameters are consistent with Grade II diastolic dysfunction (pseudonormalization).  2. Right ventricular systolic function is mildly reduced. The right ventricular size is mildly enlarged. There is mildly elevated pulmonary artery systolic pressure. The estimated right ventricular systolic pressure is 0000000 mmHg.  3. Left atrial size was mildly dilated.  4. Right atrial size was mildly dilated.  5. The mitral valve is normal in structure. Trivial mitral valve regurgitation. No evidence of mitral stenosis.  6. The aortic valve is tricuspid. Aortic valve regurgitation is not visualized. No aortic stenosis is present.  7. The inferior vena cava is dilated in size with <50% respiratory variability, suggesting right atrial pressure of 15 mmHg. FINDINGS  Left Ventricle: Left ventricular ejection fraction, by estimation, is 30%. The left ventricle has moderate to severely decreased function. The left ventricle demonstrates global hypokinesis. Definity contrast agent was given IV to delineate the left ventricular endocardial borders. The left ventricular internal cavity size was  normal in size. There is mild left ventricular hypertrophy. Left ventricular diastolic parameters are consistent with Grade II diastolic dysfunction (pseudonormalization). Right Ventricle: The right ventricular size is mildly enlarged. No increase in right ventricular wall thickness. Right ventricular systolic function is mildly reduced. There is mildly elevated pulmonary artery systolic pressure. The tricuspid regurgitant  velocity  is 2.38 m/s, and with an assumed right atrial pressure of 15 mmHg, the estimated right ventricular systolic pressure is 0000000 mmHg. Left Atrium: Left atrial size was mildly dilated. Right Atrium: Right atrial size was mildly dilated. Pericardium: There is no evidence of pericardial effusion. Mitral Valve: The mitral valve is normal in structure. Trivial mitral valve regurgitation. No evidence of mitral valve stenosis. Tricuspid Valve: The tricuspid valve is normal in structure. Tricuspid valve regurgitation is trivial. Aortic Valve: The aortic valve is tricuspid. Aortic valve regurgitation is not visualized. No aortic stenosis is present. Pulmonic Valve: The pulmonic valve was normal in structure. Pulmonic valve regurgitation is not visualized. Aorta: The aortic root is normal in size and structure. Venous: The inferior vena cava is dilated in size with less than 50% respiratory variability, suggesting right atrial pressure of 15 mmHg. IAS/Shunts: No atrial level shunt detected by color flow Doppler.  LEFT VENTRICLE PLAX 2D LVIDd:         5.30 cm  Diastology LVIDs:         3.60 cm  LV e' lateral:   7.72 cm/s LV PW:         1.90 cm  LV E/e' lateral: 14.1 LV IVS:        1.30 cm  LV e' medial:    6.74 cm/s LVOT diam:     2.50 cm  LV E/e' medial:  16.2 LV SV:         85 LV SV Index:   32 LVOT Area:     4.91 cm  RIGHT VENTRICLE             IVC RV Basal diam:  4.70 cm     IVC diam: 2.80 cm RV Mid diam:    3.60 cm RV S prime:     10.70 cm/s TAPSE (M-mode): 2.7 cm LEFT ATRIUM              Index        RIGHT ATRIUM           Index LA diam:        4.90 cm  1.87 cm/m  RA Area:     27.10 cm LA Vol (A2C):   78.7 ml  30.10 ml/m RA Volume:   94.30 ml  36.06 ml/m LA Vol (A4C):   100.0 ml 38.24 ml/m LA Biplane Vol: 88.0 ml  33.65 ml/m  AORTIC VALVE LVOT Vmax:   80.80 cm/s LVOT Vmean:  53.200 cm/s LVOT VTI:    0.173 m  AORTA Ao Root diam: 3.50 cm Ao Asc diam:  3.50 cm MITRAL VALVE                TRICUSPID VALVE MV Area (PHT): 2.80 cm     TR Peak grad:   22.7 mmHg MV Decel Time: 271 msec     TR Vmax:        238.00 cm/s MV E velocity: 109.00 cm/s MV A velocity: 67.40 cm/s   SHUNTS MV E/A ratio:  1.62         Systemic VTI:  0.17 m                             Systemic Diam: 2.50 cm Loralie Champagne MD Electronically signed by Loralie Champagne MD Signature Date/Time: 04/05/2020/10:50:11 AM    Final       Medications:     Scheduled Medications: . atorvastatin  40 mg  Oral q1800  . furosemide  40 mg Intravenous BID  . pantoprazole (PROTONIX) IV  40 mg Intravenous Q12H  . QUEtiapine  200 mg Oral QHS  . sodium chloride flush  3 mL Intravenous Q12H     Infusions: . sodium chloride       PRN Medications:  sodium chloride, acetaminophen, ondansetron (ZOFRAN) IV, sodium chloride flush     Assessment/Plan   1. Severe Anemia - Hgb 4.2 on admit, symptomatic w/ syncope/orthostasis   - Similar presentation in 2017, treated at [facility]. Per [facility]where, EGD / colonoscopy were unrevealing. Hemolysis labs negative. He had a transverse colon polyp removed. Path c/w tubular adenoma - continue to transfuse, hgb up to 5.3 today. Will give additional blood (will be getting his 4th unit this am) - GI following. Plan is for EDG/ colonoscopy tomorrow. Appreciate GI's assistance - Eliquis on hold  - Continue IV Protonix  2. Syncope - 2/2 hypovolemia from severe anemia - management per above - resting BP 123/95. HF/antihypertensive meds on hold - place on bed rest. Use SCDs for VTE prophylaxis    - will  also need to monitor on tele for arrhthymias given low EF. Brief NSVT captured on tele, longest run 8 beats. Will need eventual ICD. 3. Acute on chronic systolic CHF: Echo in [id number] with EF 25-30%, echo was done today and reviewed showing EF 30% with mildly decreased RV systolic function. Suspect mixed ischemic/nonischemic cardiomyopathy. He seems to be markedly orthostatic with syncope. However, he is also volume overloaded with dilated IVC on echo ,wheezing, and JVD. He had 500 cc NS upon admit - continue IV Lasix 40 mg bid for now with blood, stop after dose this evening and reassess.  - Continue to hold Coreg, Entresto, and spironolactone for now. Follow BP.  - With persistently low EF, should get ICD. Not CRT candidate.  4. CAD: Last cath in 11/20 with occluded OM2 stent, 90% D1, severe diffuse disease in the RCA (no intervention). Occasional atypical chest pain. Doubt ACS.  - No ASA given apixaban use.  - Continue atorvastatin. Good lipids in 1/21.  - Cycle troponin  5. Atrial fibrillation: Paroxysmal. He is in NSR today. Denies overt bleeding.  - Hold Eliquis for anemia  6. DMII: SSI  7. OSA: Continue CPAP.  8. AKI - SCr 1.62 on admit (baseline ~1.3) - suspect pre-renal from hypovolemia - continue to treat anemia - Continue to Hold Entresto and Arlyce Harman for now  - SCr 1.59 today. Continue to follow   Length of Stay: 1  Brittainy Simmons, PA-C  04/06/2020, 7:18 AM  Advanced Heart Failure Team Pager [phone] (M-F; 7a - 4p)  Please contact [facility] for night-coverage after hours (4p -7a ) and weekends on [email or web address]  Patient seen with PA, agree with the above note.   Patient is now getting his 4th unit PRBCs, hgb 5.3 after 2 units.  Feels better, less fatigued and no dizziness. He continues to deny BRBPR/melena.   General: NAD Neck: JVP 10-12 cm, no thyromegaly or thyroid nodule.  Lungs: Clear to auscultation bilaterally with normal respiratory effort. CV: Nondisplaced PMI.   Heart regular S1/S2, no S3/S4, no murmur.  No edema. Abdomen: Soft, nontender, no hepatosplenomegaly, no distention.  Skin: Intact without lesions or rashes.  Neurologic: Alert and oriented x 3.  Psych: Normal affect. Extremities: No clubbing or cyanosis.  HEENT: Normal.   Continue to transfuse Hgb < 8, will recheck CBC after 4th unit.  Continue IV PPI.  Will  need EGD/colonoscopy, planned for tomorrow.  Eliquis on hold. He is in NSR.   Still with evidence on exam for volume overload.  Will give IV Lasix with blood, hold after dose this evening.   Other Coreg, Entresto, spironolactone held with orthostasis and active bleeding.   Loralie Champagne 04/06/2020 1:24 PM

## 2020-04-06 NOTE — Progress Notes (Signed)
Critical trop called 165, paged MD, Primary nurse made aware

## 2020-04-06 NOTE — Progress Notes (Signed)
CRITICAL VALUE ALERT  Critical Value:  Hemoglobin 5.3  Date & Time Notied:  04/06/20 0200  Provider Notified: Conigilo MD  Orders Received/Actions taken: [place] for 2 more units of RBC's

## 2020-04-07 ENCOUNTER — Ambulatory Visit: Payer: Medicare Other

## 2020-04-07 DIAGNOSIS — K921 Melena: Secondary | ICD-10-CM

## 2020-04-07 DIAGNOSIS — I480 Paroxysmal atrial fibrillation: Secondary | ICD-10-CM

## 2020-04-07 LAB — CBC WITH DIFFERENTIAL/PLATELET
Abs Immature Granulocytes: 0.02 10*3/uL (ref 0.00–0.07)
Basophils Absolute: 0.1 10*3/uL (ref 0.0–0.1)
Basophils Relative: 1 %
Eosinophils Absolute: 0.4 10*3/uL (ref 0.0–0.5)
Eosinophils Relative: 5 %
HCT: 26.2 % — ABNORMAL LOW (ref 39.0–52.0)
Hemoglobin: 7.7 g/dL — ABNORMAL LOW (ref 13.0–17.0)
Immature Granulocytes: 0 %
Lymphocytes Relative: 14 %
Lymphs Abs: 1 10*3/uL (ref 0.7–4.0)
MCH: 22.1 pg — ABNORMAL LOW (ref 26.0–34.0)
MCHC: 29.4 g/dL — ABNORMAL LOW (ref 30.0–36.0)
MCV: 75.1 fL — ABNORMAL LOW (ref 80.0–100.0)
Monocytes Absolute: 0.8 10*3/uL (ref 0.1–1.0)
Monocytes Relative: 10 %
Neutro Abs: 5.4 10*3/uL (ref 1.7–7.7)
Neutrophils Relative %: 70 %
Platelets: 302 10*3/uL (ref 150–400)
RBC: 3.49 MIL/uL — ABNORMAL LOW (ref 4.22–5.81)
RDW: 20.5 % — ABNORMAL HIGH (ref 11.5–15.5)
WBC: 7.6 10*3/uL (ref 4.0–10.5)
nRBC: 0.4 % — ABNORMAL HIGH (ref 0.0–0.2)

## 2020-04-07 LAB — BASIC METABOLIC PANEL
Anion gap: 7 (ref 5–15)
BUN: 19 mg/dL (ref 6–20)
CO2: 25 mmol/L (ref 22–32)
Calcium: 8.4 mg/dL — ABNORMAL LOW (ref 8.9–10.3)
Chloride: 105 mmol/L (ref 98–111)
Creatinine, Ser: 1.37 mg/dL — ABNORMAL HIGH (ref 0.61–1.24)
GFR calc Af Amer: >60 mL/min (ref >60)
GFR calc non Af Amer: 56 mL/min — ABNORMAL LOW (ref >60)
Glucose, Bld: 187 mg/dL — ABNORMAL HIGH (ref 70–99)
Potassium: 4.1 mmol/L (ref 3.5–5.1)
Sodium: 137 mmol/L (ref 135–145)

## 2020-04-07 LAB — GLUCOSE, CAPILLARY
Glucose-Capillary: 161 mg/dL — ABNORMAL HIGH (ref 70–99)
Glucose-Capillary: 169 mg/dL — ABNORMAL HIGH (ref 70–99)
Glucose-Capillary: 195 mg/dL — ABNORMAL HIGH (ref 70–99)
Glucose-Capillary: 135 mg/dL — ABNORMAL HIGH (ref 70–99)

## 2020-04-07 LAB — CBC
HCT: 28.7 % — ABNORMAL LOW (ref 39.0–52.0)
Hemoglobin: 8.5 g/dL — ABNORMAL LOW (ref 13.0–17.0)
MCH: 22.3 pg — ABNORMAL LOW (ref 26.0–34.0)
MCHC: 29.6 g/dL — ABNORMAL LOW (ref 30.0–36.0)
MCV: 75.1 fL — ABNORMAL LOW (ref 80.0–100.0)
Platelets: 304 10*3/uL (ref 150–400)
RBC: 3.82 MIL/uL — ABNORMAL LOW (ref 4.22–5.81)
RDW: 19.9 % — ABNORMAL HIGH (ref 11.5–15.5)
WBC: 7.8 10*3/uL (ref 4.0–10.5)
nRBC: 0.4 % — ABNORMAL HIGH (ref 0.0–0.2)

## 2020-04-07 LAB — PREPARE RBC (CROSSMATCH)

## 2020-04-07 MED ORDER — SPIRONOLACTONE 12.5 MG HALF TABLET
12.5000 mg | ORAL_TABLET | Freq: Every day | ORAL | Status: DC
  Administered 2020-04-07: 12:00:00 12.5 mg via ORAL
  Filled 2020-04-07: qty 1

## 2020-04-07 MED ORDER — SODIUM CHLORIDE 0.9% IV SOLUTION: Freq: Once | INTRAVENOUS | Status: AC

## 2020-04-07 MED ORDER — CARVEDILOL 3.125 MG PO TABS
3.1250 mg | ORAL_TABLET | Freq: Two times a day (BID) | ORAL | Status: DC
  Administered 2020-04-07 – 2020-04-08 (×2): 3.125 mg via ORAL
  Filled 2020-04-07 (×2): qty 1

## 2020-04-07 NOTE — H&P (View-Only) (Signed)
        Daily Rounding Note  04/07/2020, 10:11 AM  LOS: 2 days   SUBJECTIVE:   Chief complaint: anemia requiring transfusion    Getting unit # 7 currently.  Last BM yest afternoon was dark brown,  Not  Melena and no blood  OBJECTIVE:         Vital signs in last 24 hours:    Temp:  [98.1 F (36.7 C)-98.9 F (37.2 C)] 98.1 F (36.7 C) (05/12 0528) Pulse Rate:  [52-68] 65 (05/11 2005) Resp:  [11-18] 17 (05/12 0528) BP: (83-130)/(47-70) 119/64 (05/12 0528) SpO2:  [94 %-100 %] 97 % (05/11 2005) Weight:  [140 kg] 140 kg (05/12 0528) Last BM Date: 04/06/20 Filed Weights   04/05/20 1254 04/06/20 0003 04/07/20 0528  Weight: (!) 145.5 kg (!) 142.7 kg (!) 140 kg   General: NAD.  Obese,  Comfortable.     Heart: irreg, irreg Chest: basilar crackles.   Abdomen: soft, obese, NT.  Active BS  Extremities: no CCE Neuro/Psych:  Alert, oriented x 3.  No gross deficits, no tremors.  Fluid speech.    Intake/Output from previous day: 05/11 0701 - 05/12 0700 In: 2765.1 [P.O.:1920; I.V.:100; Blood:745.1] Out: 3520 [Urine:3520]  Intake/Output this shift: Total I/O In: -  Out: 300 [Urine:300]  Lab Results: Recent Labs    04/06/20 1625 04/06/20 2328 04/07/20 0810  WBC 7.3 8.2 7.6  HGB 7.2* 7.6* 7.7*  HCT 23.8* 25.4* 26.2*  PLT 315 318 302   BMET Recent Labs    04/05/20 1048 04/06/20 0036 04/07/20 0810  NA 133* 135 137  K 5.0 4.8 4.1  CL 104 105 105  CO2 21* 21* 25  GLUCOSE 133* 178* 187*  BUN 34* 33* 19  CREATININE 1.62* 1.59* 1.37*  CALCIUM 8.5* 8.4* 8.4*   LFT Recent Labs    04/05/20 1048  PROT 6.8  ALBUMIN 3.1*  AST 24  ALT 26  ALKPHOS 60  BILITOT 0.8   PT/INR No results for input(s): LABPROT, INR in the last 72 hours. Hepatitis Panel No results for input(s): HEPBSAG, HCVAB, HEPAIGM, HEPBIGM in the last 72 hours.  Studies/Results: DG Chest Port 1 View  Result Date: 04/06/2020 CLINICAL DATA:  Dyspnea.  EXAM: PORTABLE CHEST 1 VIEW COMPARISON:  CTA chest and chest x-ray dated October 11, 2019. FINDINGS: Stable cardiomegaly. Pulmonary vascular congestion and mild diffuse interstitial thickening. No focal consolidation, pleural effusion, or pneumothorax. No acute osseous abnormality. IMPRESSION: 1. Mild interstitial pulmonary edema. Electronically Signed   By: William T Derry M.D.   On: 04/06/2020 07:30   ECHOCARDIOGRAM COMPLETE  Result Date: 04/05/2020 IMPRESSIONS  1. Left ventricular ejection fraction, by estimation, is 30%. The left ventricle has moderate to severely decreased function. The left ventricle demonstrates global hypokinesis. There is mild left ventricular hypertrophy. Left ventricular diastolic parameters are consistent with Grade II diastolic dysfunction (pseudonormalization).  2. Right ventricular systolic function is mildly reduced. The right ventricular size is mildly enlarged. There is mildly elevated pulmonary artery systolic pressure. The estimated right ventricular systolic pressure is 37.7 mmHg.  3. Left atrial size was mildly dilated.  4. Right atrial size was mildly dilated.  5. The mitral valve is normal in structure. Trivial mitral valve regurgitation. No evidence of mitral stenosis.  6. The aortic valve is tricuspid. Aortic valve regurgitation is not visualized. No aortic stenosis is present.  7. The inferior vena cava is dilated in size with <50% respiratory variability, suggesting right atrial pressure of 15   mmHg. Electronically signed by [name] Mclean MD Signature Date/Time: 04/05/2020/10:50:11 AM    Final    Scheduled Meds: . sodium chloride   Intravenous Once  . atorvastatin  40 mg Oral q1800  . carvedilol  3.125 mg Oral BID WC  . pantoprazole (PROTONIX) IV  40 mg Intravenous Q12H  . QUEtiapine  200 mg Oral QHS  . sodium chloride flush  3 mL Intravenous Q12H  . spironolactone  12.5 mg Oral Daily   Continuous Infusions: . sodium chloride     PRN Meds:.sodium  chloride, acetaminophen, ondansetron (ZOFRAN) IV, sodium chloride flush   ASSESMENT:   *    Microcytic anemia in setting of Eliquis, ASA. Unrevealing EGD/colonoscopy for same in 04/2016.  Tubular adenoma removed from colon.   Hgb 4.2 >> 2 PRBCs >> 5.8 >> 2 PRBCs >> 7.2 >> 1 PRBC >> 7.7 >> 1 unit >> .     FOBT +   *    Systolic CHF.  EF 30%. Grade 2 DD.   Previous cardiac stent.  *   AKI.  Improved  *    Paroxysmal A. Fib.  Eliquis initiated 08/2019.  On hold now.  Last dose 5/10.    *    OSA.  *   IDDM.    *    Social issues including functional illiteracy, transportation issues, schizophrenia..   PLAN   *  SB enteroscopy, tmrw? Will d/w Dr Danis.      Sarah Gribbin  04/07/2020, 10:11 AM Phone [phone]  I have discussed the case with the PA, and that is the plan I formulated. I personally interviewed and examined the patient.  Acute blood loss anemia.  History unclear, but probably recent melena.  Underlying chronic anemia from CKD. Hemoglobin much improved after transfusion.  Cardiology feels he is euvolemic, and that his elevated troponin was demand rather than acute coronary syndrome.  EF remains about 30% with grade 2 diastolic dysfunction. Eliquis remains on hold.  Plan is for EGD and small bowel enteroscopy tomorrow with MAC sedation.  The benefits and risks of the planned procedure were described in detail with the patient or (when appropriate) their health care proxy.  Risks were outlined as including, but not limited to, bleeding, infection, perforation, adverse medication reaction leading to cardiac or pulmonary decompensation, pancreatitis (if ERCP).  The limitation of incomplete mucosal visualization was also discussed.  No guarantees or warranties were given.  Patient at increased risk for cardiopulmonary complications of procedure due to medical comorbidities.  Depending on findings, might then see to colonoscopy following day.  Sandra Brents L Danis  III Office: [phone]  

## 2020-04-07 NOTE — Progress Notes (Addendum)
Daily Rounding Note  04/07/2020, 10:11 AM  LOS: 2 days   SUBJECTIVE:   Chief complaint: anemia requiring transfusion    Getting unit # 7 currently.  Last BM yest afternoon was dark brown,  Not  Melena and no blood  OBJECTIVE:         Vital signs in last 24 hours:    Temp:  [98.1 F (36.7 C)-98.9 F (37.2 C)] 98.1 F (36.7 C) (05/12 0528) Pulse Rate:  [52-68] 65 (05/11 2005) Resp:  [11-18] 17 (05/12 0528) BP: (83-130)/(47-70) 119/64 (05/12 0528) SpO2:  [94 %-100 %] 97 % (05/11 2005) Weight:  [140 kg] 140 kg (05/12 0528) Last BM Date: 04/06/20 Filed Weights   04/05/20 1254 04/06/20 0003 04/07/20 0528  Weight: (!) 145.5 kg (!) 142.7 kg (!) 140 kg   General: NAD.  Obese,  Comfortable.     Heart: irreg, irreg Chest: basilar crackles.   Abdomen: soft, obese, NT.  Active BS  Extremities: no CCE Neuro/Psych:  Alert, oriented x 3.  No gross deficits, no tremors.  Fluid speech.    Intake/Output from previous day: 05/11 0701 - 05/12 0700 In: 2765.1 [P.O.:1920; I.V.:100; Blood:745.1] Out: 3520 [Urine:3520]  Intake/Output this shift: Total I/O In: -  Out: 300 [Urine:300]  Lab Results: Recent Labs    04/06/20 1625 04/06/20 2328 04/07/20 0810  WBC 7.3 8.2 7.6  HGB 7.2* 7.6* 7.7*  HCT 23.8* 25.4* 26.2*  PLT 315 318 302   BMET Recent Labs    04/05/20 1048 04/06/20 0036 04/07/20 0810  NA 133* 135 137  K 5.0 4.8 4.1  CL 104 105 105  CO2 21* 21* 25  GLUCOSE 133* 178* 187*  BUN 34* 33* 19  CREATININE 1.62* 1.59* 1.37*  CALCIUM 8.5* 8.4* 8.4*   LFT Recent Labs    04/05/20 1048  PROT 6.8  ALBUMIN 3.1*  AST 24  ALT 26  ALKPHOS 60  BILITOT 0.8   PT/INR No results for input(s): LABPROT, INR in the last 72 hours. Hepatitis Panel No results for input(s): HEPBSAG, HCVAB, HEPAIGM, HEPBIGM in the last 72 hours.  Studies/Results: DG Chest Port 1 View  Result Date: 04/06/2020 CLINICAL DATA:  Dyspnea.  EXAM: PORTABLE CHEST 1 VIEW COMPARISON:  CTA chest and chest x-ray dated October 11, 2019. FINDINGS: Stable cardiomegaly. Pulmonary vascular congestion and mild diffuse interstitial thickening. No focal consolidation, pleural effusion, or pneumothorax. No acute osseous abnormality. IMPRESSION: 1. Mild interstitial pulmonary edema. Electronically Signed   By: Titus Dubin M.D.   On: 04/06/2020 07:30   ECHOCARDIOGRAM COMPLETE  Result Date: 04/05/2020 IMPRESSIONS  1. Left ventricular ejection fraction, by estimation, is 30%. The left ventricle has moderate to severely decreased function. The left ventricle demonstrates global hypokinesis. There is mild left ventricular hypertrophy. Left ventricular diastolic parameters are consistent with Grade II diastolic dysfunction (pseudonormalization).  2. Right ventricular systolic function is mildly reduced. The right ventricular size is mildly enlarged. There is mildly elevated pulmonary artery systolic pressure. The estimated right ventricular systolic pressure is 54.6 mmHg.  3. Left atrial size was mildly dilated.  4. Right atrial size was mildly dilated.  5. The mitral valve is normal in structure. Trivial mitral valve regurgitation. No evidence of mitral stenosis.  6. The aortic valve is tricuspid. Aortic valve regurgitation is not visualized. No aortic stenosis is present.  7. The inferior vena cava is dilated in size with <50% respiratory variability, suggesting right atrial pressure of 15  mmHg. Electronically signed by Loralie Champagne MD Signature Date/Time: 04/05/2020/10:50:11 AM    Final    Scheduled Meds: . sodium chloride   Intravenous Once  . atorvastatin  40 mg Oral q1800  . carvedilol  3.125 mg Oral BID WC  . pantoprazole (PROTONIX) IV  40 mg Intravenous Q12H  . QUEtiapine  200 mg Oral QHS  . sodium chloride flush  3 mL Intravenous Q12H  . spironolactone  12.5 mg Oral Daily   Continuous Infusions: . sodium chloride     PRN Meds:.sodium  chloride, acetaminophen, ondansetron (ZOFRAN) IV, sodium chloride flush   ASSESMENT:   *    Microcytic anemia in setting of Eliquis, ASA. Unrevealing EGD/colonoscopy for same in 04/2016.  Tubular adenoma removed from colon.   Hgb 4.2 >> 2 PRBCs >> 5.8 >> 2 PRBCs >> 7.2 >> 1 PRBC >> 7.7 >> 1 unit >> .     FOBT +   *    Systolic CHF.  EF 30%. Grade 2 DD.   Previous cardiac stent.  *   AKI.  Improved  *    Paroxysmal A. Fib.  Eliquis initiated 08/2019.  On hold now.  Last dose 5/10.    *    OSA.  *   IDDM.    *    Social issues including functional illiteracy, transportation issues, schizophrenia.[name] Kitchen   PLAN   *  SB enteroscopy, tmrw? Will d/w Dr Loletha Carrow.      Azucena Freed  04/07/2020, 10:11 AM Phone [phone]  I have discussed the case with the PA, and that is the plan I formulated. I personally interviewed and examined the patient.  Acute blood loss anemia.  History unclear, but probably recent melena.  Underlying chronic anemia from CKD. Hemoglobin much improved after transfusion.  Cardiology feels he is euvolemic, and that his elevated troponin was demand rather than acute coronary syndrome.  EF remains about 30% with grade 2 diastolic dysfunction. Eliquis remains on hold.  Plan is for EGD and small bowel enteroscopy tomorrow with MAC sedation.  The benefits and risks of the planned procedure were described in detail with the patient or (when appropriate) their health care proxy.  Risks were outlined as including, but not limited to, bleeding, infection, perforation, adverse medication reaction leading to cardiac or pulmonary decompensation, pancreatitis (if ERCP).  The limitation of incomplete mucosal visualization was also discussed.  No guarantees or warranties were given.  Patient at increased risk for cardiopulmonary complications of procedure due to medical comorbidities.  Depending on findings, might then see to colonoscopy following day.  Nelida Meuse  III Office: [phone]

## 2020-04-07 NOTE — Progress Notes (Signed)
Patient ID: [name], male   DOB: 25-Feb-1961, 59 y.o.   MRN: [id number]     Advanced Heart Failure Rounding Note  PCP-Cardiologist: [name], MD  AHF: Dr. Aundra Dubin   Patient Profile   59 yo obese male with history of chronic systolic CHF, CAD, type 2 diabetes, paroxysmal atrial fibrillation, on chronic anticoagulation therapy w/ Eliquis, h/o severe anemia in 2017 treated at [facility] and schizophrenia, admitted for syncope and orthostasis and found to have severe anemia w/ hgb of 4.2.    Subjective:    Received total of 4 units PRBCs so far, Hgb 4.2>>4.8>>5.3>>7.6.  CBC still pending this morning.    He has been getting IV Lasix with blood, I/Os negative 755.    BP stable.    GI following, will need scopes.  Apparently now planned for tomorrow. FOBT+ but no BRBPR/melena described.   SCr 1.62>>1.59 (baseline ~1.3). Pending BMET this morning.   Feeling better, denies dyspnea and lying flat.     Objective:   Weight Range: (!) 140 kg Body mass index is 39.62 kg/m.   Vital Signs:   Temp:  [98.1 F (36.7 C)-98.9 F (37.2 C)] 98.1 F (36.7 C) (05/12 0528) Pulse Rate:  [52-68] 65 (05/11 2005) Resp:  [11-18] 17 (05/12 0528) BP: (83-130)/(47-70) 119/64 (05/12 0528) SpO2:  [94 %-100 %] 97 % (05/11 2005) Weight:  [140 kg] 140 kg (05/12 0528) Last BM Date: 04/06/20  Weight change: Filed Weights   04/05/20 1254 04/06/20 0003 04/07/20 0528  Weight: (!) 145.5 kg (!) 142.7 kg (!) 140 kg    Intake/Output:   Intake/Output Summary (Last 24 hours) at 04/07/2020 0857 Last data filed at 04/07/2020 0842 Gross per 24 hour  Intake 2285.08 ml  Output 3340 ml  Net -1054.92 ml      Physical Exam    General: NAD Neck: JVP 10 cm, no thyromegaly or thyroid nodule.  Lungs: Crackles at bases.  CV: Nondisplaced PMI.  Heart regular S1/S2, no S3/S4, no murmur.  No peripheral edema.   Abdomen: Soft, nontender, no hepatosplenomegaly, no distention.  Skin: Intact without lesions  or rashes.  Neurologic: Alert and oriented x 3.  Psych: Normal affect. Extremities: No clubbing or cyanosis.  HEENT: Normal.    Telemetry   NSR 60s, personally reviewed.   Labs    CBC Recent Labs    04/06/20 0036 04/06/20 0036 04/06/20 1625 04/06/20 2328  WBC 6.8   < > 7.3 8.2  NEUTROABS 4.4  --   --   --   HGB 5.3*   < > 7.2* 7.6*  HCT 18.8*   < > 23.8* 25.4*  MCV 69.9*   < > 73.7* 74.3*  PLT 340   < > 315 318   < > = values in this interval not displayed.   Basic Metabolic Panel Recent Labs    04/05/20 1048 04/05/20 1429 04/06/20 0036  NA 133*  --  135  K 5.0  --  4.8  CL 104  --  105  CO2 21*  --  21*  GLUCOSE 133*  --  178*  BUN 34*  --  33*  CREATININE 1.62*  --  1.59*  CALCIUM 8.5*  --  8.4*  MG  --  2.3  --    Liver Function Tests Recent Labs    04/05/20 1048  AST 24  ALT 26  ALKPHOS 60  BILITOT 0.8  PROT 6.8  ALBUMIN 3.1*   No results for input(s): LIPASE,  AMYLASE in the last 72 hours. Cardiac Enzymes No results for input(s): CKTOTAL, CKMB, CKMBINDEX, TROPONINI in the last 72 hours.  BNP: BNP (last 3 results) Recent Labs    10/11/19 1603 12/16/19 1108 12/23/19 1113  BNP 1,202.2* 1,878.9* 1,322.6*    ProBNP (last 3 results) Recent Labs    11/03/19 1647 03/15/20 0832  PROBNP 6,177* 231*     D-Dimer No results for input(s): DDIMER in the last 72 hours. Hemoglobin A1C No results for input(s): HGBA1C in the last 72 hours. Fasting Lipid Panel No results for input(s): CHOL, HDL, LDLCALC, TRIG, CHOLHDL, LDLDIRECT in the last 72 hours. Thyroid Function Tests No results for input(s): TSH, T4TOTAL, T3FREE, THYROIDAB in the last 72 hours.  Invalid input(s): FREET3  Other results:   Imaging    No results found.   Medications:     Scheduled Medications: . atorvastatin  40 mg Oral q1800  . carvedilol  3.125 mg Oral BID WC  . furosemide  40 mg Intravenous BID  . pantoprazole (PROTONIX) IV  40 mg Intravenous Q12H  .  QUEtiapine  200 mg Oral QHS  . sodium chloride flush  3 mL Intravenous Q12H  . spironolactone  12.5 mg Oral Daily    Infusions: . sodium chloride      PRN Medications: sodium chloride, acetaminophen, ondansetron (ZOFRAN) IV, sodium chloride flush     Assessment/Plan   1. Anemia due to GI bleeding: Hgb 4.2 on admit, symptomatic w/ syncope/orthostasis.  Similar presentation in 2017, treated at [facility] Per Care[facility]where, EGD / colonoscopy were unrevealing. Hemolysis labs negative. He had a transverse colon polyp removed. Path c/w tubular adenoma.  FOBT+ but denies melena/BRBPR.  He has now had 4 units PRBCs.  - Pending CBC this morning, transfuse hgb < 8.  - GI following. Sounds like endoscopy tomorrow.  - Eliquis on hold  - Continue IV Protonix  2. Syncope: In setting of severe anemia. BP now stable s/p tranfusions.  3. Acute on chronic systolic CHF: Echo in [id number] with EF 25-30%, echo was done today and reviewed showing EF 30% with mildly decreased RV systolic function. Suspect mixed ischemic/nonischemic cardiomyopathy. He has developed some volume overload with transfusions.  - Give Lasix 40 mg IV x 1 this morning and will follow for further need.   - Continue to hold Entresto.  Can restart Coreg at 3.125 mg bid and spironolactone 12.5 mg daily.  - With persistently low EF, should get ICD. Not CRT candidate.  4. CAD: Last cath in 11/20 with occluded OM2 stent, 90% D1, severe diffuse disease in the RCA (no intervention). Occasional atypical chest pain. Slightly elevated HS-TnI without significant trend 165 => 182.  I do NOT think this is suggestive of ACS, this is expected demand ischemia in setting of marked anemia and volume overload and do not think this needs to delay his scopes.   - No ASA given apixaban use.  - Continue atorvastatin. Good lipids in 1/21.  5. Atrial fibrillation: Paroxysmal. He is in NSR today. - Hold Eliquis for now.  6. DMII: SSI  7. OSA: Continue CPAP.  8.  AKI: Creatinine mildly higher than baseline, waiting for BMET today.   Length of Stay: 2  Loralie Champagne, MD  04/07/2020, 8:57 AM  Advanced Heart Failure Team Pager [phone] (M-F; 7a - 4p)  Please contact [facility] for night-coverage after hours (4p -7a ) and weekends on [email or web address]

## 2020-04-07 NOTE — TOC Progression Note (Signed)
Transition of Care [facility]) - Progression Note    Patient Details  Name: [name] MRN: [id number] Date of Birth: [birth year]  Transition of Care [facility]) CM/SW Contact  Zenon Mayo, RN Phone Number: 04/07/2020, 12:04 PM  Clinical Narrative:    Patient lives with his sister, [name]. He states he is set up with [facility], so he does not need HHRN.  TOC team will continue to follow for dc needs.         Expected Discharge Plan and Services                                                 Social Determinants of Health (SDOH) Interventions    Readmission Risk Interventions No flowsheet data found.

## 2020-04-08 ENCOUNTER — Inpatient Hospital Stay (HOSPITAL_COMMUNITY): Payer: Medicare Other | Admitting: Anesthesiology

## 2020-04-08 ENCOUNTER — Encounter (HOSPITAL_COMMUNITY): Payer: Self-pay | Admitting: Cardiology

## 2020-04-08 ENCOUNTER — Encounter (HOSPITAL_COMMUNITY)
Admission: AD | Disposition: A | Discharge status: Home / Self Care | Payer: Self-pay | Source: Ambulatory Visit | Attending: Cardiology

## 2020-04-08 DIAGNOSIS — D500 Iron deficiency anemia secondary to blood loss (chronic): Secondary | ICD-10-CM

## 2020-04-08 DIAGNOSIS — K31819 Angiodysplasia of stomach and duodenum without bleeding: Secondary | ICD-10-CM

## 2020-04-08 DIAGNOSIS — K31811 Angiodysplasia of stomach and duodenum with bleeding: Secondary | ICD-10-CM

## 2020-04-08 HISTORY — PX: HEMOSTASIS CLIP PLACEMENT: SHX6857

## 2020-04-08 HISTORY — PX: ENTEROSCOPY: SHX5533

## 2020-04-08 HISTORY — PX: HEMOSTASIS CONTROL: SHX6838

## 2020-04-08 LAB — BPAM RBC
Blood Product Expiration Date: 202106052359
Blood Product Expiration Date: 202106052359
Blood Product Expiration Date: 202106052359
Blood Product Expiration Date: 202106052359
Blood Product Expiration Date: 202106072359
Blood Product Expiration Date: 202106072359
ISSUE DATE / TIME: 202105101549
ISSUE DATE / TIME: 202105102029
ISSUE DATE / TIME: 202105110326
ISSUE DATE / TIME: 202105111131
ISSUE DATE / TIME: 202105111709
ISSUE DATE / TIME: 202105121030
Unit Type and Rh: 7300
Unit Type and Rh: 7300
Unit Type and Rh: 7300
Unit Type and Rh: 7300
Unit Type and Rh: 7300
Unit Type and Rh: 7300

## 2020-04-08 LAB — TYPE AND SCREEN
ABO/RH(D): B POS
Antibody Screen: NEGATIVE
Unit division: 0
Unit division: 0
Unit division: 0
Unit division: 0
Unit division: 0
Unit division: 0

## 2020-04-08 LAB — BASIC METABOLIC PANEL
Anion gap: 8 (ref 5–15)
BUN: 14 mg/dL (ref 6–20)
CO2: 26 mmol/L (ref 22–32)
Calcium: 8.5 mg/dL — ABNORMAL LOW (ref 8.9–10.3)
Chloride: 105 mmol/L (ref 98–111)
Creatinine, Ser: 1 mg/dL (ref 0.61–1.24)
GFR calc Af Amer: >60 mL/min (ref >60)
GFR calc non Af Amer: >60 mL/min (ref >60)
Glucose, Bld: 133 mg/dL — ABNORMAL HIGH (ref 70–99)
Potassium: 3.9 mmol/L (ref 3.5–5.1)
Sodium: 139 mmol/L (ref 135–145)

## 2020-04-08 LAB — CBC WITH DIFFERENTIAL/PLATELET
Abs Immature Granulocytes: 0.03 10*3/uL (ref 0.00–0.07)
Basophils Absolute: 0 10*3/uL (ref 0.0–0.1)
Basophils Relative: 0 %
Eosinophils Absolute: 0.4 10*3/uL (ref 0.0–0.5)
Eosinophils Relative: 5 %
HCT: 29.8 % — ABNORMAL LOW (ref 39.0–52.0)
Hemoglobin: 8.8 g/dL — ABNORMAL LOW (ref 13.0–17.0)
Immature Granulocytes: 0 %
Lymphocytes Relative: 10 %
Lymphs Abs: 0.8 10*3/uL (ref 0.7–4.0)
MCH: 22.3 pg — ABNORMAL LOW (ref 26.0–34.0)
MCHC: 29.5 g/dL — ABNORMAL LOW (ref 30.0–36.0)
MCV: 75.4 fL — ABNORMAL LOW (ref 80.0–100.0)
Monocytes Absolute: 0.7 10*3/uL (ref 0.1–1.0)
Monocytes Relative: 10 %
Neutro Abs: 5.7 10*3/uL (ref 1.7–7.7)
Neutrophils Relative %: 75 %
Platelets: 322 10*3/uL (ref 150–400)
RBC: 3.95 MIL/uL — ABNORMAL LOW (ref 4.22–5.81)
RDW: 20.3 % — ABNORMAL HIGH (ref 11.5–15.5)
WBC: 7.7 10*3/uL (ref 4.0–10.5)
nRBC: 0 % (ref 0.0–0.2)

## 2020-04-08 LAB — GLUCOSE, CAPILLARY
Glucose-Capillary: 127 mg/dL — ABNORMAL HIGH (ref 70–99)
Glucose-Capillary: 186 mg/dL — ABNORMAL HIGH (ref 70–99)
Glucose-Capillary: 181 mg/dL — ABNORMAL HIGH (ref 70–99)
Glucose-Capillary: 157 mg/dL — ABNORMAL HIGH (ref 70–99)

## 2020-04-08 SURGERY — ENTEROSCOPY
Anesthesia: Monitor Anesthesia Care

## 2020-04-08 MED ORDER — GLUCAGON HCL RDNA (DIAGNOSTIC) 1 MG IJ SOLR
INTRAMUSCULAR | Status: AC
  Filled 2020-04-08: qty 1

## 2020-04-08 MED ORDER — CARVEDILOL 6.25 MG PO TABS
6.2500 mg | ORAL_TABLET | Freq: Two times a day (BID) | ORAL | Status: DC
  Administered 2020-04-08 – 2020-04-09 (×3): 6.25 mg via ORAL
  Filled 2020-04-08 (×3): qty 1

## 2020-04-08 MED ORDER — PROPOFOL 500 MG/50ML IV EMUL
INTRAVENOUS | Status: DC | PRN
  Administered 2020-04-08: 100 ug/kg/min via INTRAVENOUS

## 2020-04-08 MED ORDER — GLUCAGON HCL RDNA (DIAGNOSTIC) 1 MG IJ SOLR
INTRAMUSCULAR | Status: DC | PRN
  Administered 2020-04-08: .5 mg via INTRAVENOUS

## 2020-04-08 MED ORDER — FUROSEMIDE 40 MG PO TABS
40.0000 mg | ORAL_TABLET | Freq: Two times a day (BID) | ORAL | Status: DC
  Administered 2020-04-08 – 2020-04-09 (×3): 40 mg via ORAL
  Filled 2020-04-08 (×4): qty 1

## 2020-04-08 MED ORDER — BUTAMBEN-TETRACAINE-BENZOCAINE 2-2-14 % EX AERO
INHALATION_SPRAY | CUTANEOUS | Status: DC | PRN
  Administered 2020-04-08: 2 via TOPICAL

## 2020-04-08 MED ORDER — LACTATED RINGERS IV SOLN: INTRAVENOUS | Status: DC | PRN

## 2020-04-08 MED ORDER — SPIRONOLACTONE 25 MG PO TABS
25.0000 mg | ORAL_TABLET | Freq: Every day | ORAL | Status: DC
  Administered 2020-04-08 – 2020-04-09 (×2): 25 mg via ORAL
  Filled 2020-04-08 (×2): qty 1

## 2020-04-08 MED ORDER — LACTATED RINGERS IV SOLN
INTRAVENOUS | Status: DC
  Administered 2020-04-08: 1000 mL via INTRAVENOUS

## 2020-04-08 MED ORDER — PROPOFOL 10 MG/ML IV BOLUS
INTRAVENOUS | Status: DC | PRN
  Administered 2020-04-08: 30 mg via INTRAVENOUS

## 2020-04-08 MED ORDER — PHENYLEPHRINE HCL (PRESSORS) 10 MG/ML IV SOLN
INTRAVENOUS | Status: DC | PRN
  Administered 2020-04-08: 100 ug via INTRAVENOUS

## 2020-04-08 NOTE — Interval H&P Note (Signed)
History and Physical Interval Note:  04/08/2020 11:07 AM  [name]  has presented today for surgery, with the diagnosis of Anemia.  FOBT positive.  The various methods of treatment have been discussed with the patient and family. After consideration of risks, benefits and other options for treatment, the patient has consented to  Procedure(s): ENTEROSCOPY (N/A) as a surgical intervention.  The patient's history has been reviewed, patient examined, no change in status, stable for surgery.  I have reviewed the patient's chart and labs.  Questions were answered to the patient's satisfaction.    Hgb stable at 8.8 today  [name]

## 2020-04-08 NOTE — Progress Notes (Addendum)
Patient ID: [name], male   DOB: [birth year], 59 y.o.   MRN: [id number]     Advanced Heart Failure Rounding Note  PCP-Cardiologist: [name], MD  AHF: Dr. Aundra Dubin   Patient Profile   59 yo obese male with history of chronic systolic CHF, CAD, type 2 diabetes, paroxysmal atrial fibrillation, on chronic anticoagulation therapy w/ Eliquis, h/o severe anemia in 2017 treated at [facility] and schizophrenia, admitted for syncope and orthostasis and found to have severe anemia w/ hgb of 4.2.    Subjective:    Received total of 6 units PRBCs so far, Hgb 4.2>>4.8>>5.3>>7.6>>8.5.    He has been getting IV Lasix with blood, I/Os net negative 3L since admit.    BP stable. Tolerating restart of Coreg and spiro ok. No further dizziness, syncope/ near syncope    Had BM yesterday. Denies melena/ hematochezia.   Feels ok today. No complaints. Denies CP and dyspnea.       Objective:   Weight Range: (!) 138.8 kg Body mass index is 39.29 kg/m.   Vital Signs:   Temp:  [97.5 F (36.4 C)-98.9 F (37.2 C)] 98.6 F (37 C) (05/13 0700) Pulse Rate:  [57-72] 69 (05/13 0534) Resp:  [13-22] 15 (05/13 0534) BP: (101-137)/(59-96) 128/64 (05/13 0700) SpO2:  [87 %-99 %] 95 % (05/13 0534) Weight:  [138.8 kg] 138.8 kg (05/13 0047) Last BM Date: 04/07/20  Weight change: Filed Weights   04/06/20 0003 04/07/20 0528 04/08/20 0047  Weight: (!) 142.7 kg (!) 140 kg (!) 138.8 kg    Intake/Output:   Intake/Output Summary (Last 24 hours) at 04/08/2020 0733 Last data filed at 04/08/2020 0725 Gross per 24 hour  Intake 1020 ml  Output 3555 ml  Net -2535 ml      Physical Exam    PHYSICAL EXAM: General:  Well appearing obese [name]. No respiratory difficulty HEENT: normal Neck: supple. no JVD 8 cm. Carotids 2+ bilat; no bruits. No lymphadenopathy or thyromegaly appreciated. Cor: PMI nondisplaced. Regular rate & rhythm. No rubs, gallops or murmurs. Lungs: clear Abdomen: obese, soft, nontender,  nondistended. No hepatosplenomegaly. No bruits or masses. Good bowel sounds. Extremities: no cyanosis, clubbing, rash, edema, + SCDs Neuro: alert & oriented x 3, cranial nerves grossly intact. moves all 4 extremities w/o difficulty. Affect pleasant.   Telemetry   NSR 60s, 4 beats of NSVT x 1, 1 missed beat ~2 sec personally reviewed.   Labs    CBC Recent Labs    04/06/20 0036 04/06/20 1625 04/07/20 0810 04/07/20 1558  WBC 6.8   < > 7.6 7.8  NEUTROABS 4.4  --  5.4  --   HGB 5.3*   < > 7.7* 8.5*  HCT 18.8*   < > 26.2* 28.7*  MCV 69.9*   < > 75.1* 75.1*  PLT 340   < > 302 304   < > = values in this interval not displayed.   Basic Metabolic Panel Recent Labs    04/05/20 1048 04/05/20 1429 04/06/20 0036 04/07/20 0810  NA   < >  --  135 137  K   < >  --  4.8 4.1  CL   < >  --  105 105  CO2   < >  --  21* 25  GLUCOSE   < >  --  178* 187*  BUN   < >  --  33* 19  CREATININE   < >  --  1.59* 1.37*  CALCIUM   < >  --  8.4* 8.4*  MG  --  2.3  --   --    < > = values in this interval not displayed.   Liver Function Tests Recent Labs    04/05/20 1048  AST 24  ALT 26  ALKPHOS 60  BILITOT 0.8  PROT 6.8  ALBUMIN 3.1*   No results for input(s): LIPASE, AMYLASE in the last 72 hours. Cardiac Enzymes No results for input(s): CKTOTAL, CKMB, CKMBINDEX, TROPONINI in the last 72 hours.  BNP: BNP (last 3 results) Recent Labs    10/11/19 1603 12/16/19 1108 12/23/19 1113  BNP 1,202.2* 1,878.9* 1,322.6*    ProBNP (last 3 results) Recent Labs    11/03/19 1647 03/15/20 0832  PROBNP 6,177* 231*     D-Dimer No results for input(s): DDIMER in the last 72 hours. Hemoglobin A1C No results for input(s): HGBA1C in the last 72 hours. Fasting Lipid Panel No results for input(s): CHOL, HDL, LDLCALC, TRIG, CHOLHDL, LDLDIRECT in the last 72 hours. Thyroid Function Tests No results for input(s): TSH, T4TOTAL, T3FREE, THYROIDAB in the last 72 hours.  Invalid input(s):  FREET3  Other results:   Imaging    No results found.   Medications:     Scheduled Medications: . atorvastatin  40 mg Oral q1800  . carvedilol  3.125 mg Oral BID WC  . pantoprazole (PROTONIX) IV  40 mg Intravenous Q12H  . QUEtiapine  200 mg Oral QHS  . sodium chloride flush  3 mL Intravenous Q12H  . spironolactone  12.5 mg Oral Daily    Infusions: . sodium chloride      PRN Medications: sodium chloride, acetaminophen, ondansetron (ZOFRAN) IV, sodium chloride flush     Assessment/Plan   1. Anemia due to GI bleeding: Hgb 4.2 on admit, symptomatic w/ syncope/orthostasis.  Similar presentation in 2017, treated at [facility]. Per CareEverywhere, EGD / colonoscopy were unrevealing. Hemolysis labs negative. He had a transverse colon polyp removed. Path c/w tubular adenoma.  FOBT+ but denies melena/BRBPR.  He has now had 6 units PRBCs.  - Hgb improved, 8.8 today.  Transfuse for hgb < 8.  - GI following. Plan is for endoscopy today  - Eliquis on hold  - Continue IV Protonix  2. Syncope: In setting of severe anemia. BP now stable s/p tranfusions.  3. Acute on chronic systolic CHF: Echo in [id number] with EF 25-30%, echo was done today and reviewed showing EF 30% with mildly decreased RV systolic function. Suspect mixed ischemic/nonischemic cardiomyopathy. He developed some volume overload with transfusions>>improved w/ IV Lasix  - Volume status ok today.  - Continue to hold Entresto.  - Coreg 3.125 mg bid and spironolactone 12.5 mg daily restarted. BP stable.  - With persistently low EF, should get ICD. Not CRT candidate.  4. CAD: Last cath in 11/20 with occluded OM2 stent, 90% D1, severe diffuse disease in the RCA (no intervention). Occasional atypical chest pain. Slightly elevated HS-TnI without significant trend 165 => 182.  I do NOT think this is suggestive of ACS, this is expected demand ischemia in setting of marked anemia and volume overload and do not think this needs to delay his  scopes.   - No ASA given apixaban use.  - Continue atorvastatin. Good lipids in 1/21.  5. Atrial fibrillation: Paroxysmal. He is in NSR today. - Hold Eliquis for now.  6. DMII: SSI  7. OSA: Continue CPAP.  8. AKI: improving, 1.62>>1.59>>1.37   Length of Stay: 3  Brittainy Simmons, PA-C  04/08/2020, 7:33 AM  Advanced Heart Failure Team Pager [phone] (M-F; [place])  Please contact [facility] for night-coverage after hours (4p -7a ) and weekends on [email or web address]  Patient seen with PA, agree with the above note.    Hgb up to 8.8 today.  FOBT+ but no overt bleeding noted.  He has now had 6 units PRBCs.   General: NAD Neck: Thick, no JVD, no thyromegaly or thyroid nodule.  Lungs: Clear to auscultation bilaterally with normal respiratory effort. CV: Nondisplaced PMI.  Heart regular S1/S2, no S3/S4, no murmur.  No peripheral edema.   Abdomen: Soft, nontender, no hepatosplenomegaly, no distention.  Skin: Intact without lesions or rashes.  Neurologic: Alert and oriented x 3.  Psych: Normal affect. Extremities: No clubbing or cyanosis.  HEENT: Normal.   No further blood needed at this point. He feels better.  - Will be going for endoscopy today.  - Eliquis remains on hold.  BP improving.  Can increase Coreg and spironolactone today (gradually bring back up to home doses).  Restart home Lasix. Probably restart Entresto tomorrow.   Loralie Champagne 04/08/2020 8:23 AM

## 2020-04-08 NOTE — Interval H&P Note (Signed)
History and Physical Interval Note:  04/08/2020 11:04 AM  [name]  has presented today for surgery, with the diagnosis of Anemia.  FOBT positive.  The various methods of treatment have been discussed with the patient and family. After consideration of risks, benefits and other options for treatment, the patient has consented to  Procedure(s): ENTEROSCOPY (N/A) as a surgical intervention.  The patient's history has been reviewed, patient examined, no change in status, stable for surgery.  I have reviewed the patient's chart and labs.  Questions were answered to the patient's satisfaction.    Hgb stable at 8.8 today.  [name]

## 2020-04-08 NOTE — Anesthesia Postprocedure Evaluation (Signed)
Anesthesia Post Note  Patient: SABA NEUMAN  Procedure(s) Performed: ENTEROSCOPY (N/A ) HEMOSTASIS CONTROL HEMOSTASIS CLIP PLACEMENT     Patient location during evaluation: Endoscopy Anesthesia Type: MAC Level of consciousness: awake and alert, oriented and patient cooperative Pain management: pain level controlled Vital Signs Assessment: post-procedure vital signs reviewed and stable Respiratory status: spontaneous breathing, nonlabored ventilation, respiratory function stable and patient connected to nasal cannula oxygen Cardiovascular status: blood pressure returned to baseline and stable Postop Assessment: no apparent nausea or vomiting Anesthetic complications: no    Last Vitals:  Vitals:   04/08/20 1215 04/08/20 1254  BP: (!) 134/45 132/76  Pulse: (!) 57   Resp: 15   Temp:  37.1 C  SpO2: 99%     Last Pain:  Vitals:   04/08/20 1254  TempSrc: Oral  PainSc:                  Braycen Burandt,E. Alizae Bechtel

## 2020-04-08 NOTE — Op Note (Addendum)
[facility] Patient Name: [name] Procedure Date : 04/08/2020 MRN: [id number] Attending MD: Estill Cotta. Loletha Carrow , MD Date of Birth: [birth year] CSN: [id number] Age: 59 Admit Type: Inpatient Procedure:                Small bowel enteroscopy Indications:              Iron deficiency anemia secondary to chronic blood                            loss Providers:                Mallie Mussel L. Loletha Carrow, MD, Benetta Spar RN, RN, Elspeth Cho Tech., Technician, Eligha Bridegroom CRNA Referring MD:             Dr. Loralie Champagne (Cardiology) Medicines:                Monitored Anesthesia Care Complications:            No immediate complications. Estimated Blood Loss:     Estimated blood loss was minimal. Procedure:                Pre-Anesthesia Assessment:                           - Prior to the procedure, a History and Physical                            was performed, and patient medications and                            allergies were reviewed. The patient's tolerance of                            previous anesthesia was also reviewed. The risks                            and benefits of the procedure and the sedation                            options and risks were discussed with the patient.                            All questions were answered, and informed consent                            was obtained. Prior Anticoagulants: The patient has                            taken Eliquis (apixaban), last dose was 3 days                            prior to procedure. ASA Grade Assessment: III - A  patient with severe systemic disease. After                            reviewing the risks and benefits, the patient was                            deemed in satisfactory condition to undergo the                            procedure.                           After obtaining informed consent, the endoscope was   passed under direct vision. Throughout the                            procedure, the patient's blood pressure, pulse, and                            oxygen saturations were monitored continuously. The                            [id number]-H190 [id number] Olympus gastroscope was                            introduced through the mouth and advanced to the                            second part of duodenum. The [id number]-H190DL [id number]                            Olympus pediatric colonoscope was introduced                            through the mouth and advanced to the proximal                            jejunum. The small bowel enteroscopy was performed                            with difficulty due to the patient's body habitus,                            sleep apnea and challenging airway management. Scope In: Scope Out: Findings:      The esophagus was normal.      Two diminutive angioectasias (one with contact bleeding) were found on       the anterior wall of the gastric body. Coagulation for hemostasis using       argon plasma at 0.5 liters/minute and 20 watts was successful.      Four angiodysplastic lesions with no bleeding were found close to each       other in the proximal jejunum. Coagulation for hemostasis using argon       plasma at 0.5 liters/minute and 20 watts was successful. To prevent       bleeding post-intervention,  two hemostatic clips were successfully       placed on different sites(MR conditional). There was no bleeding at the       end of the procedure. Impression:               - Normal esophagus.                           - Two bleeding angioectasias in the stomach.                            Treated with argon plasma coagulation (APC).                           - Four non-bleeding angiodysplastic lesions in the                            duodenum. Treated with argon plasma coagulation                            (APC). Clips (MR conditional) were placed.                            - No specimens collected. Recommendation:           - Return patient to hospital ward for ongoing care.                           - Resume regular diet.                           - Resume Eliquis (apixaban) at prior dose in 3                            days. Refer to referring physician for further                            adjustment of therapy.                           - Patient can be discharged home from a GI                            perspective. Will need close follow up with                            referring physician and CBC next week. Discharge on                            oral iron therapy. Procedure Code(s):        --- Professional ---                           [phone], Small intestinal endoscopy, enteroscopy                            beyond  second portion of duodenum, not including                            ileum; with control of bleeding (eg, injection,                            bipolar cautery, unipolar cautery, laser, heater                            probe, stapler, plasma coagulator) Diagnosis Code(s):        --- Professional ---                           K31.811, Angiodysplasia of stomach and duodenum                            with bleeding                           D50.0, Iron deficiency anemia secondary to blood                            loss (chronic) CPT copyright 2019 [employer]. All rights reserved. The codes documented in this report are preliminary and upon coder review may  be revised to meet current compliance requirements.  L. Loletha Carrow, MD 04/08/2020 12:11:40 PM This report has been signed electronically. Number of Addenda: 0

## 2020-04-08 NOTE — Anesthesia Preprocedure Evaluation (Addendum)
Anesthesia Evaluation  Patient identified by MRN, date of birth, ID band Patient awake    Reviewed: Allergy & Precautions, NPO status , Patient's Chart, lab work & pertinent test results, reviewed documented beta blocker date and time   History of Anesthesia Complications Negative for: history of anesthetic complications  Airway Mallampati: I  TM Distance: >3 FB Neck ROM: Full    Dental  (+) Poor Dentition, Loose, Missing, Dental Advisory Given   Pulmonary sleep apnea and Continuous Positive Airway Pressure Ventilation , former smoker,  04/05/2020 SARS coronavirus neg   breath sounds clear to auscultation       Cardiovascular hypertension, Pt. on medications and Pt. on home beta blockers + angina + CAD, + Cardiac Stents and +CHF (awaiting AICD)  + dysrhythmias (paroxysmal) Atrial Fibrillation  Rhythm:Regular Rate:Normal  04/05/2020 ECHO: EF 30%, global hypokinesis, mildly reduced RVF, valves OK  Cards regarding Chest pain/CHF: I do NOT think this is suggestive of ACS, this is expected demand ischemia in setting of marked anemia and volume overload and do not think this needs to delay his scopes   Neuro/Psych Anxiety Depression Schizophrenia negative neurological ROS     GI/Hepatic negative GI ROS, Neg liver ROS,   Endo/Other  diabetes (glu 127), Insulin DependentMorbid obesity  Renal/GU Renal InsufficiencyRenal disease     Musculoskeletal   Abdominal (+) + obese,   Peds  Hematology  (+) Blood dyscrasia (Hb 8.8), anemia , eliquis   Anesthesia Other Findings   Reproductive/Obstetrics                            Anesthesia Physical Anesthesia Plan  ASA: IV  Anesthesia Plan: MAC   Post-op Pain Management:    Induction:   PONV Risk Score and Plan: 3  Airway Management Planned: Natural Airway and Nasal Cannula  Additional Equipment: None  Intra-op Plan:   Post-operative Plan:    Informed Consent: I have reviewed the patients History and Physical, chart, labs and discussed the procedure including the risks, benefits and alternatives for the proposed anesthesia with the patient or authorized representative who has indicated his/her understanding and acceptance.     Dental advisory given  Plan Discussed with: CRNA and Surgeon  Anesthesia Plan Comments:        Anesthesia Quick Evaluation

## 2020-04-08 NOTE — Transfer of Care (Signed)
Immediate Anesthesia Transfer of Care Note  Patient: [name]  Procedure(s) Performed: ENTEROSCOPY (N/A ) HEMOSTASIS CONTROL HEMOSTASIS CLIP PLACEMENT  Patient Location: Endoscopy Unit  Anesthesia Type:MAC  Level of Consciousness: awake and alert   Airway & Oxygen Therapy: Patient Spontanous Breathing and Patient connected to nasal cannula oxygen  Post-op Assessment: Report given to RN and Post -op Vital signs reviewed and stable  Post vital signs: Reviewed and stable  Last Vitals:  Vitals Value Taken Time  BP 114/25 04/08/20 1200  Temp    Pulse 58 04/08/20 1202  Resp 19 04/08/20 1202  SpO2 95 % 04/08/20 1202  Vitals shown include unvalidated device data.  Last Pain:  Vitals:   04/08/20 1012  TempSrc: Oral  PainSc:          Complications: No apparent anesthesia complications

## 2020-04-08 NOTE — Anesthesia Procedure Notes (Signed)
Procedure Name: MAC Date/Time: 04/08/2020 11:15 AM Performed by: Eligha Bridegroom, CRNA Pre-anesthesia Checklist: Patient identified, Emergency Drugs available, Suction available, Patient being monitored and Timeout performed Patient Re-evaluated:Patient Re-evaluated prior to induction Oxygen Delivery Method: Nasal cannula Preoxygenation: Pre-oxygenation with 100% oxygen Induction Type: IV induction

## 2020-04-09 ENCOUNTER — Ambulatory Visit: Payer: Self-pay | Admitting: Family Medicine

## 2020-04-09 DIAGNOSIS — D500 Iron deficiency anemia secondary to blood loss (chronic): Secondary | ICD-10-CM

## 2020-04-09 LAB — CBC WITH DIFFERENTIAL/PLATELET
Abs Immature Granulocytes: 0.02 10*3/uL (ref 0.00–0.07)
Basophils Absolute: 0 10*3/uL (ref 0.0–0.1)
Basophils Relative: 0 %
Eosinophils Absolute: 0.4 10*3/uL (ref 0.0–0.5)
Eosinophils Relative: 5 %
HCT: 29.9 % — ABNORMAL LOW (ref 39.0–52.0)
Hemoglobin: 8.7 g/dL — ABNORMAL LOW (ref 13.0–17.0)
Immature Granulocytes: 0 %
Lymphocytes Relative: 8 %
Lymphs Abs: 0.7 10*3/uL (ref 0.7–4.0)
MCH: 22.1 pg — ABNORMAL LOW (ref 26.0–34.0)
MCHC: 29.1 g/dL — ABNORMAL LOW (ref 30.0–36.0)
MCV: 75.9 fL — ABNORMAL LOW (ref 80.0–100.0)
Monocytes Absolute: 0.9 10*3/uL (ref 0.1–1.0)
Monocytes Relative: 11 %
Neutro Abs: 6.4 10*3/uL (ref 1.7–7.7)
Neutrophils Relative %: 76 %
Platelets: 275 10*3/uL (ref 150–400)
RBC: 3.94 MIL/uL — ABNORMAL LOW (ref 4.22–5.81)
RDW: 20.8 % — ABNORMAL HIGH (ref 11.5–15.5)
WBC: 8.5 10*3/uL (ref 4.0–10.5)
nRBC: 0 % (ref 0.0–0.2)

## 2020-04-09 LAB — BASIC METABOLIC PANEL
Anion gap: 8 (ref 5–15)
BUN: 17 mg/dL (ref 6–20)
CO2: 28 mmol/L (ref 22–32)
Calcium: 8.6 mg/dL — ABNORMAL LOW (ref 8.9–10.3)
Chloride: 102 mmol/L (ref 98–111)
Creatinine, Ser: 1.26 mg/dL — ABNORMAL HIGH (ref 0.61–1.24)
GFR calc Af Amer: >60 mL/min (ref >60)
GFR calc non Af Amer: >60 mL/min (ref >60)
Glucose, Bld: 139 mg/dL — ABNORMAL HIGH (ref 70–99)
Potassium: 4.1 mmol/L (ref 3.5–5.1)
Sodium: 138 mmol/L (ref 135–145)

## 2020-04-09 LAB — GLUCOSE, CAPILLARY
Glucose-Capillary: 131 mg/dL — ABNORMAL HIGH (ref 70–99)
Glucose-Capillary: 170 mg/dL — ABNORMAL HIGH (ref 70–99)

## 2020-04-09 MED ORDER — PANTOPRAZOLE SODIUM 40 MG PO TBEC
40.0000 mg | DELAYED_RELEASE_TABLET | Freq: Two times a day (BID) | ORAL | 1 refills | Status: DC
Start: 2020-04-09 — End: 2020-06-14

## 2020-04-09 MED ORDER — FERROUS SULFATE 325 (65 FE) MG PO TABS: 325.0000 mg | ORAL_TABLET | Freq: Every day | ORAL | 3 refills | Status: DC

## 2020-04-09 MED ORDER — SACUBITRIL-VALSARTAN 24-26 MG PO TABS: 1.0000 | ORAL_TABLET | Freq: Two times a day (BID) | ORAL | 0 refills | Status: DC

## 2020-04-09 MED ORDER — CARVEDILOL 12.5 MG PO TABS: 6.2500 mg | ORAL_TABLET | Freq: Two times a day (BID) | ORAL | 2 refills | Status: DC

## 2020-04-09 MED ORDER — SACUBITRIL-VALSARTAN 24-26 MG PO TABS
1.0000 | ORAL_TABLET | Freq: Two times a day (BID) | ORAL | Status: DC
  Administered 2020-04-09: 1 via ORAL
  Filled 2020-04-09: qty 1

## 2020-04-09 MED ORDER — APIXABAN 5 MG PO TABS: 5.0000 mg | ORAL_TABLET | Freq: Two times a day (BID) | ORAL | 3 refills | Status: DC

## 2020-04-09 NOTE — Care Management Important Message (Deleted)
Important Message  Patient Details  Name: [name] MRN: [id number] Date of Birth: March 18, 1961   Medicare Important Message Given:  Yes     [name] 04/09/2020, 9:45 AM

## 2020-04-09 NOTE — Discharge Summary (Signed)
Advanced Heart Failure Team  Discharge Summary   Patient ID: [name] MRN: [id number], DOB/AGE: May 25, 1961 59 y.o. Admit date: 04/05/2020 D/C date:     04/09/2020   Primary Discharge Diagnoses:  Syncope Acute GIB Severe Anemia Acute on Chronic Systolic Heart Failure  CAD  Hospital Course:   59 yo obese male with history of chronic systolic CHF, CAD, type 2 diabetes, paroxysmal atrial fibrillation, on chronic anticoagulation therapy w/ Eliquis, h/o severe anemia in 2017 treated at [facility] and schizophrenia, admitted to [facility] on 04/05/20 for syncope and orthostasis and found to have severe anemia w/ hgb of 4.2.   FOBT+. Eliquis was held on admit as well as HF/ BP active meds. Started on IV Protonix.   Received total of 6 units PRBCs during admit.  Hgb improved 4.2>>4.8>>5.3>>7.6>>8.5>>8.8>>8.7.    Received IV Lasix between transfusions for volume control.   GI consulted for w/u. EGD 5/13 found 2 bleeding angioectasias in the stomach + 4 non-bleeding angiodysplastic lesions in the duodenum, all treated w/ APC. Clips (MR conditional) were placed. He was monitored and had no further bleeding. Hgb remained stable. He was started on FeSO4 325 mg daily. Per GI ok, to resume Eliquis in 3 days.   His BP improved w/ correction anemia. He had no further hypotension/ syncope or near syncope. His HF meds were resumed, but at reduced doses and he tolerated well. (will need further titration as outpatient if BP allows, see below).  On 5/14, he was seen and examined by Dr. Aundra Dubin and felt stable for d/c home. He is scheduled for repeat clinic CBC in 1 week and provider f/u in 2 weeks.   Meds for discharge: FeSO4 325 mg daily, Eliquis 5 mg bid (start Monday), Entresto 24/26 bid (increase back to prior dose 49/51 bid if BP stable at followup), Coreg 6.25 mg bid (can increase back to prior dose 12.5 mg bid at followup), spironolactone 25 daily, Lasix 40 mg bid, atorvastatin 40 daily.     Discharge  Weight Range: 303 lb  Discharge Vitals: Blood pressure 119/61, pulse 63, temperature 98.4 F (36.9 C), temperature source Oral, resp. rate 16, height '6\' 2"'  (1.88 m), weight (!) 137.5 kg, SpO2 93 %.  Labs: Lab Results  Component Value Date   WBC 8.5 04/09/2020   HGB 8.7 (L) 04/09/2020   HCT 29.9 (L) 04/09/2020   MCV 75.9 (L) 04/09/2020   PLT 275 04/09/2020    Recent Labs  Lab 04/05/20 1048 04/06/20 0036 04/09/20 0625  NA 133*   < > 138  K 5.0   < > 4.1  CL 104   < > 102  CO2 21*   < > 28  BUN 34*   < > 17  CREATININE 1.62*   < > 1.26*  CALCIUM 8.5*   < > 8.6*  PROT 6.8  --   --   BILITOT 0.8  --   --   ALKPHOS 60  --   --   ALT 26  --   --   AST 24  --   --   GLUCOSE 133*   < > 139*   < > = values in this interval not displayed.   Lab Results  Component Value Date   CHOL 120 12/02/2019   HDL 42 12/02/2019   LDLCALC 66 12/02/2019   TRIG 51 12/02/2019   BNP (last 3 results) Recent Labs    10/11/19 1603 12/16/19 1108 12/23/19 1113  BNP 1,202.2* 1,878.9* 1,322.6*  ProBNP (last 3 results) Recent Labs    11/03/19 1647 03/15/20 0832  PROBNP 6,177* 231*     Diagnostic Studies/Procedures   No results found.  Discharge Medications   Allergies as of 04/09/2020   No Known Allergies     Medication List    STOP taking these medications   aspirin EC 81 MG tablet   Black Currant Seed Oil 500 MG Caps   NovoLOG Mix 70/30 FlexPen (70-30) 100 UNIT/ML FlexPen Generic drug: insulin aspart protamine - aspart   potassium chloride SA 20 MEQ tablet Commonly known as: KLOR-CON   sacubitril-valsartan 49-51 MG Commonly known as: ENTRESTO Replaced by: sacubitril-valsartan 24-26 MG   Turmeric 500 MG Caps   UNABLE TO FIND     TAKE these medications   acetaminophen 500 MG tablet Commonly known as: TYLENOL Take 500 mg by mouth every 6 (six) hours as needed for mild pain or moderate pain.   apixaban 5 MG Tabs tablet Commonly known as: ELIQUIS Take 1  tablet (5 mg total) by mouth 2 (two) times daily. Start taking on: Apr 12, 2020 What changed: These instructions start on Apr 12, 2020. If you are unsure what to do until then, ask your doctor or other care provider.   atorvastatin 40 MG tablet Commonly known as: LIPITOR Take 1 tablet (40 mg total) by mouth daily at 6 PM.   blood glucose meter kit and supplies Kit Dispense based on patient and insurance preference. Use up to four times daily as directed. (FOR ICD-9 250.00, 250.01). What changed:   how much to take  how to take this  when to take this   carvedilol 12.5 MG tablet Commonly known as: COREG Take 0.5 tablets (6.25 mg total) by mouth 2 (two) times daily with a meal. What changed: how much to take   Centrum Silver 50+Men Tabs Take 1 tablet by mouth daily.   cyclobenzaprine 10 MG tablet Commonly known as: FLEXERIL Take 1 tablet (10 mg total) by mouth 3 (three) times daily as needed for muscle spasms.   ferrous sulfate 325 (65 FE) MG tablet Take 1 tablet (325 mg total) by mouth daily with breakfast.   furosemide 40 MG tablet Commonly known as: LASIX Take 1 tablet (40 mg total) by mouth 2 (two) times daily.   gabapentin 300 MG capsule Commonly known as: NEURONTIN Take 2 capsules (600 mg = total), by mouth, 2 times daily. What changed:   how much to take  how to take this  when to take this  additional instructions   Insulin Lispro Prot & Lispro (75-25) 100 UNIT/ML Kwikpen Commonly known as: HumaLOG Mix 75/25 KwikPen Inject 30 Units into the skin 2 (two) times daily.   pantoprazole 40 MG tablet Commonly known as: Protonix Take 1 tablet (40 mg total) by mouth 2 (two) times daily.   QUEtiapine 100 MG tablet Commonly known as: SEROquel Take 1 tablet (100 mg total) by mouth at bedtime. What changed: how much to take   sacubitril-valsartan 24-26 MG Commonly known as: ENTRESTO Take 1 tablet by mouth 2 (two) times daily. Replaces: sacubitril-valsartan  49-51 MG   sildenafil 100 MG tablet Commonly known as: Viagra Take 1 tablet (100 mg total) by mouth daily as needed for erectile dysfunction.   spironolactone 25 MG tablet Commonly known as: ALDACTONE Take 1 tablet (25 mg total) by mouth daily.   tetrahydrozoline 0.05 % ophthalmic solution Place 1 drop into both eyes daily.       Disposition  The patient will be discharged in stable condition to home.  Follow-up Information    [facility] Follow up on 04/16/2020.   Specialty: Cardiology Why: 12:30 PM for repeat lab work at the [facility] parking [place]  Contact information: [address] [id number] [place]       [name], MD Follow up on 04/22/2020.   Specialty: Cardiology Why: [facility] 1:30 PM You will see Dr. Claris Gladden PA  Parking Garage Code [id number]  Contact information: [place] [address] [phone]             Duration of Discharge Encounter: Greater than 35 minutes   Signed, Nelida Gores 04/09/2020, 4:40 PM

## 2020-04-09 NOTE — Progress Notes (Addendum)
Clarified with heart failure team, patient will stop potassium upon discharge. Discharge teaching complete. Meds, diet, activity, follow up appointments reviewed and all questions answered. Copy of instructions given to patient and prescriptions sent to pharmacy.   Patient discharged home via wheelchair with volunteer. The patient's sister is taking him home.

## 2020-04-09 NOTE — Progress Notes (Addendum)
Patient ID: [name], male   DOB: [birth year], 60 y.o.   MRN: [id number]     Advanced Heart Failure Rounding Note  PCP-Cardiologist: Joel Him, MD  AHF: Dr. Aundra Long   Patient Profile   59 yo obese male with history of chronic systolic CHF, CAD, type 2 diabetes, paroxysmal atrial fibrillation, on chronic anticoagulation therapy w/ Eliquis, h/o severe anemia in 2017 treated at [facility] and schizophrenia, admitted for syncope and orthostasis and found to have severe anemia w/ hgb of 4.2.    Subjective:    Received total of 6 units PRBCs so far, Hgb 4.2>>4.8>>5.3>>7.6>>8.5>>8.8>>8.7.    EGD 5/13 found 2 bleeding angioectasias in the stomach + 4 non-bleeding angiodysplastic lesions in the duodenum, all treated w/ APC. Clips (MR conditional) were placed.  Had BM last night. No melena.   He has been getting IV Lasix with blood, I/Os net negative 3L since admit. Back on home lasix dose.    BP stable on Coreg and spiro. SBP low 100s-110s.   No cardiac complaints today.     EGD 04/08/20     Impression:       - Normal esophagus.                           - Two bleeding angioectasias in the stomach.                            Treated with argon plasma coagulation (APC).                           - Four non-bleeding angiodysplastic lesions in the                            duodenum. Treated with argon plasma coagulation                            (APC). Clips (MR conditional) were placed.                           - No specimens collected. Recommendation:  - Return patient to hospital ward for ongoing care.                           - Resume regular diet.                           - Resume Eliquis (apixaban) at prior dose in 3                            days. Refer to referring physician for further                            adjustment of therapy.                           - Patient can be discharged home from a GI                            perspective. Will need  close follow  up with                            referring physician and CBC next week. Discharge on                            oral iron therapy.    Objective:   Weight Range: (!) 137.5 kg Body mass index is 38.92 kg/m.   Vital Signs:   Temp:  [97.8 F (36.6 C)-99.3 F (37.4 C)] 98.9 F (37.2 C) (05/14 0728) Pulse Rate:  [57-66] 66 (05/14 0728) Resp:  [14-20] 18 (05/14 0728) BP: (96-137)/(25-80) 96/80 (05/14 0728) SpO2:  [94 %-100 %] 95 % (05/14 0728) FiO2 (%):  [32 %] 32 % (05/13 2310) Weight:  [137.5 kg] 137.5 kg (05/14 0420) Last BM Date: 04/08/20  Weight change: Filed Weights   04/07/20 0528 04/08/20 0047 04/09/20 0420  Weight: (!) 140 kg (!) 138.8 kg (!) 137.5 kg    Intake/Output:   Intake/Output Summary (Last 24 hours) at 04/09/2020 1039 Last data filed at 04/09/2020 0842 Gross per 24 hour  Intake 1722 ml  Output 1500 ml  Net 222 ml      Physical Exam   General:  Well appearing obese [name], sitting in bed. No respiratory difficulty HEENT: normal Neck: supple. no JVD. Carotids 2+ bilat; no bruits. No lymphadenopathy or thyromegaly appreciated. Cor: PMI nondisplaced. Regular rate & rhythm. No rubs, gallops or murmurs. Lungs: clear, no wheezing  Abdomen: obese, soft, nontender, nondistended. No hepatosplenomegaly. No bruits or masses. Good bowel sounds. Extremities: no cyanosis, clubbing, rash, edema, Neuro: alert & oriented x 3, cranial nerves grossly intact. moves all 4 extremities w/o difficulty. Affect pleasant.   Telemetry   NSR 70s   Labs    CBC Recent Labs    04/08/20 0714 04/09/20 0625  WBC 7.7 8.5  NEUTROABS 5.7 6.4  HGB 8.8* 8.7*  HCT 29.8* 29.9*  MCV 75.4* 75.9*  PLT 322 [id number]   Basic Metabolic Panel Recent Labs    04/08/20 0714 04/09/20 0625  NA 139 138  K 3.9 4.1  CL 105 102  CO2 26 28  GLUCOSE 133* 139*  BUN 14 17  CREATININE 1.00 1.26*  CALCIUM 8.5* 8.6*   Liver Function Tests No results for input(s): AST, ALT, ALKPHOS, BILITOT,  PROT, ALBUMIN in the last 72 hours. No results for input(s): LIPASE, AMYLASE in the last 72 hours. Cardiac Enzymes No results for input(s): CKTOTAL, CKMB, CKMBINDEX, TROPONINI in the last 72 hours.  BNP: BNP (last 3 results) Recent Labs    10/11/19 1603 12/16/19 1108 12/23/19 1113  BNP 1,202.2* 1,878.9* 1,322.6*    ProBNP (last 3 results) Recent Labs    11/03/19 1647 03/15/20 0832  PROBNP 6,177* 231*     D-Dimer No results for input(s): DDIMER in the last 72 hours. Hemoglobin A1C No results for input(s): HGBA1C in the last 72 hours. Fasting Lipid Panel No results for input(s): CHOL, HDL, LDLCALC, TRIG, CHOLHDL, LDLDIRECT in the last 72 hours. Thyroid Function Tests No results for input(s): TSH, T4TOTAL, T3FREE, THYROIDAB in the last 72 hours.  Invalid input(s): FREET3  Other results:   Imaging    No results found.   Medications:     Scheduled Medications: . atorvastatin  40 mg Oral q1800  . carvedilol  6.25 mg Oral BID WC  . furosemide  40 mg Oral BID  .  pantoprazole (PROTONIX) IV  40 mg Intravenous Q12H  . QUEtiapine  200 mg Oral QHS  . sodium chloride flush  3 mL Intravenous Q12H  . spironolactone  25 mg Oral Daily    Infusions: . sodium chloride      PRN Medications: sodium chloride, acetaminophen, ondansetron (ZOFRAN) IV, sodium chloride flush     Assessment/Plan   1. Anemia due to GI bleeding: Hgb 4.2 on admit, symptomatic w/ syncope/orthostasis.  Similar presentation in 2017, treated at [facility]. Per CareEverywhere, EGD / colonoscopy were unrevealing. Hemolysis labs negative. He had a transverse colon polyp removed. Path c/w tubular adenoma.  FOBT+ but denies melena/BRBPR.  He has now had 6 units PRBCs.  - Hgb improved, and stable, 8.7 today.   - EGD 5/13 showed 2 bleeding angioectasias in the stomach + 4 non-bleeding angiodysplastic lesions in the duodenum, all treated w/ APC.  - Per GI, ok to resume Eliquis in 3 days. Needs repeat CBC in 1  week - Appreciate GI's assistance  - Continue IV Protonix>>PO when discharged  2. Syncope: In setting of severe anemia. BP now stable s/p tranfusions. No recurrence 3. Acute on chronic systolic CHF: Echo in [id number] with EF 25-30%, echo was done today and reviewed showing EF 30% with mildly decreased RV systolic function. Suspect mixed ischemic/nonischemic cardiomyopathy. He developed some volume overload with transfusions>>improved w/ IV Lasix  - Volume status ok today. Continue PO Lasix 40 mg bid  - Continue Coreg 6.25 mg bid and spironolactone 25 mg daily - Can try restarting low dose Entresto soon. - With persistently low EF, should get ICD. Not CRT candidate.  4. CAD: Last cath in 11/20 with occluded OM2 stent, 90% D1, severe diffuse disease in the RCA (no intervention). Occasional atypical chest pain. Slightly elevated HS-TnI without significant trend 165 => 182.  I do NOT think this is suggestive of ACS, this is expected demand ischemia in setting of marked anemia and volume overload. No plan for ischemic w/u.  - No ASA given apixaban use.  - Continue atorvastatin. Good lipids in 1/21.  5. Atrial fibrillation: Paroxysmal. He is in NSR today. - Hold Eliquis for now. Per GI, ok to resume in 3 days  6. DMII: SSI  7. OSA: Continue CPAP.  8. AKI: improving, 1.62>>1.59>>1.37 >>1.26   Length of Stay: [address] PA-C  04/09/2020, 10:39 AM  Advanced Heart Failure Team Pager [phone] (M-F; 7a - 4p)  Please contact [facility] for night-coverage after hours (4p -7a ) and weekends on [email or web address]  Patient seen with PA, agree with the above note.   EGD yesterday, gastric and duodenal AVMs were likely the source of bleeding and were treated.  Hgb stable today. No overt bleeding.  SBP 110s.  Hold Eliquis until Monday.   I think that he can go home today.  Will need to start oral iron.  He will need CBC next week.  Followup 10-14 days CHF clinic.  Meds for discharge: FeSO4 325 mg daily,  Eliquis 5 mg bid (start Monday), Entresto 24/26 bid (increase back to prior dose 49/51 bid if BP stable at followup), Coreg 6.25 mg bid (can increase back to prior dose 12.5 mg bid at followup), spironolactone 25 daily, Lasix 40 mg bid, atorvastatin 40 daily.   Loralie Champagne 04/09/2020 12:19 PM

## 2020-04-12 ENCOUNTER — Other Ambulatory Visit (HOSPITAL_COMMUNITY): Payer: Self-pay

## 2020-04-12 ENCOUNTER — Telehealth (HOSPITAL_COMMUNITY): Payer: Self-pay | Admitting: Licensed Clinical Social Worker

## 2020-04-12 NOTE — Progress Notes (Signed)
Paramedicine Encounter    Patient ID: [name], male    DOB: 02-23-1961, 59 y.o.   MRN: [id number]   Patient Care Team: Nolene Ebbs, MD as PCP - General (Internal Medicine) [name], MD as PCP - Cardiology (Cardiology) [name], MD as PCP - Advanced Heart Failure (Cardiology)  Patient Active Problem List   Diagnosis Date Noted  . Anemia due to chronic blood loss   . Gastric AVM   . AVM (arteriovenous malformation) of small bowel, acquired   . Acute on chronic systolic (congestive) heart failure ([place]) 04/05/2020  . Anemia 04/05/2020  . PAF (paroxysmal atrial fibrillation) ([place]) 04/05/2020  . OSA on CPAP 04/05/2020  . Syncope and collapse 04/05/2020  . Type 2 diabetes mellitus with hyperglycemia, with long-term current use of insulin ([place]) 12/03/2019  . Diabetic polyneuropathy associated with type 2 diabetes mellitus ([place]) 12/03/2019  . Hyperglycemia 12/03/2019  . History of hyperglycemia 12/03/2019  . Erectile dysfunction 12/03/2019  . CHF (congestive heart failure) ([place]) 10/11/2019  . Paranoid schizophrenia, chronic condition ([place]) 01/26/2015  . Severe recurrent major depressive disorder with psychotic features ([place]) 01/26/2015  . GAD (generalized anxiety disorder) 01/26/2015  . OCD (obsessive compulsive disorder) 01/26/2015  . Panic disorder without agoraphobia 01/26/2015  . Insomnia 01/26/2015    Current Outpatient Medications:  .  acetaminophen (TYLENOL) 500 MG tablet, Take 500 mg by mouth every 6 (six) hours as needed for mild pain or moderate pain., Disp: , Rfl:  .  apixaban (ELIQUIS) 5 MG TABS tablet, Take 1 tablet (5 mg total) by mouth 2 (two) times daily., Disp: 180 tablet, Rfl: 3 .  atorvastatin (LIPITOR) 40 MG tablet, Take 1 tablet (40 mg total) by mouth daily at 6 PM., Disp: 90 tablet, Rfl: 3 .  blood glucose meter kit and supplies KIT, Dispense based on patient and insurance preference. Use up to four times daily as directed. (FOR ICD-9  250.00, 250.01). (Patient taking differently: 1 each by Other route See admin instructions. Dispense based on patient and insurance preference. Use up to four times daily as directed. (FOR ICD-9 250.00, 250.01).), Disp: 1 each, Rfl: 0 .  carvedilol (COREG) 12.5 MG tablet, Take 0.5 tablets (6.25 mg total) by mouth 2 (two) times daily with a meal., Disp: 60 tablet, Rfl: 2 .  ferrous sulfate 325 (65 FE) MG tablet, Take 1 tablet (325 mg total) by mouth daily with breakfast., Disp: 30 tablet, Rfl: 3 .  furosemide (LASIX) 40 MG tablet, Take 1 tablet (40 mg total) by mouth 2 (two) times daily., Disp: 60 tablet, Rfl: 11 .  gabapentin (NEURONTIN) 300 MG capsule, Take 2 capsules (600 mg = total), by mouth, 2 times daily. (Patient taking differently: Take 600 mg by mouth 2 (two) times daily. ), Disp: 120 capsule, Rfl: 3 .  Insulin Lispro Prot & Lispro (HUMALOG MIX 75/25 KWIKPEN) (75-25) 100 UNIT/ML Kwikpen, Inject 30 Units into the skin 2 (two) times daily., Disp: 15 mL, Rfl: 11 .  Multiple Vitamins-Minerals (CENTRUM SILVER 50+MEN) TABS, Take 1 tablet by mouth daily., Disp: , Rfl:  .  pantoprazole (PROTONIX) 40 MG tablet, Take 1 tablet (40 mg total) by mouth 2 (two) times daily., Disp: 60 tablet, Rfl: 1 .  QUEtiapine (SEROQUEL) 100 MG tablet, Take 1 tablet (100 mg total) by mouth at bedtime. (Patient taking differently: Take 200 mg by mouth at bedtime. ), Disp: 30 tablet, Rfl: 6 .  sacubitril-valsartan (ENTRESTO) 24-26 MG, Take 1 tablet by mouth 2 (two)  times daily., Disp: 60 tablet, Rfl: 0 .  spironolactone (ALDACTONE) 25 MG tablet, Take 1 tablet (25 mg total) by mouth daily., Disp: 30 tablet, Rfl: 11 .  tetrahydrozoline 0.05 % ophthalmic solution, Place 1 drop into both eyes daily., Disp: , Rfl:  .  cyclobenzaprine (FLEXERIL) 10 MG tablet, Take 1 tablet (10 mg total) by mouth 3 (three) times daily as needed for muscle spasms. (Patient not taking: Reported on 04/12/2020), Disp: 30 tablet, Rfl: 3 .  sildenafil  (VIAGRA) 100 MG tablet, Take 1 tablet (100 mg total) by mouth daily as needed for erectile dysfunction. (Patient not taking: Reported on 04/12/2020), Disp: 10 tablet, Rfl: 1 No Known Allergies   Social History   Socioeconomic History  . Marital status: Legally Separated    Spouse name: Not on file  . Number of children: Not on file  . Years of education: Not on file  . Highest education level: Not on file  Occupational History  . Not on file  Tobacco Use  . 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 Smokeless tobacco: Never Used  Substance and Sexual Activity  . Alcohol use: No  . Drug use: No  . Sexual activity: Yes  Other Topics Concern  . Not on file  Social History Narrative  . Not on file   Social Determinants of Health   Financial Resource Strain: Low Risk   . Difficulty of Paying Living Expenses: Not very hard  Food Insecurity: No Food Insecurity  . Worried About Charity fundraiser in the Last Year: Never true  . Ran Out of Food in the Last Year: Never true  Transportation Needs: Unmet Transportation Needs  . Lack of Transportation (Medical): Yes  . Lack of Transportation (Non-Medical): Yes  Physical Activity:   . Days of Exercise per Week:   . Minutes of Exercise per Session:   Stress:   . Feeling of Stress :   Social Connections:   . Frequency of Communication with Friends and Family:   . Frequency of Social Gatherings with Friends and Family:   . Attends Religious Services:   . Active Member of Clubs or Organizations:   . Attends Archivist Meetings:   [name] Kitchen Marital Status:   Intimate Partner Violence:   . Fear of Current or Ex-Partner:   . Emotionally Abused:   [name] Kitchen Physically Abused:   . Sexually Abused:     Physical Exam Vitals reviewed.  HENT:     Head: Normocephalic.     Nose: Nose normal.     Mouth/Throat:     Mouth: Mucous membranes are moist.  Eyes:     Pupils: Pupils are equal, round, and reactive to light.  Cardiovascular:     Rate and  Rhythm: Normal rate and regular rhythm.     Pulses: Normal pulses.     Heart sounds: Normal heart sounds.  Pulmonary:     Effort: Pulmonary effort is normal.     Breath sounds: Normal breath sounds.  Abdominal:     General: Bowel sounds are normal. There is no distension.     Palpations: Abdomen is soft.  Musculoskeletal:        General: Normal range of motion.     Cervical back: Normal range of motion and neck supple.     Right lower leg: No edema.     Left lower leg: No edema.  Skin:    General: Skin is warm and dry.     Capillary Refill: Capillary refill  takes less than 2 seconds.  Neurological:     Mental Status: He is alert. Mental status is at baseline.  Psychiatric:        Mood and Affect: Mood normal.     Arrived for home visit for [facility] who as seated in his chair in his living room alert and oriented stating he feels good just weak and sore after having his procedure and being in the hospital last week. [name] stated he has been eating, drinking, sleeping, walking normally since discharge. Medications were reviewed and confirmed, pill box filled accordingly. [name] was informed of upcoming appointments. Due to hospitalization COVID vaccine was canceled, I rescheduled same. [name] was given appointment sheet. Vitals as noted in report. Chris weight increased by 5 lbs since hospital stay, I informed [name] to be eating heart healthy foods and decrease soda, juices and teas. He agreed with same. No JVD, no abdominal distention or leg edema noted. [name] agreed to visit in one week. Home visit complete.    Refills: Pen Needles Humalog Gabapentin Eliquis( samples from HF clinic)    Future Appointments  Date Time Provider [place]  04/16/2020 12:30 PM [facility] LAB [facility] None  04/16/2020  2:10 PM Azzie Glatter, FNP [facility] None  04/22/2020  1:30 PM [facility] PA/NP [facility] None     ACTION: Home visit completed Next visit planned for one week

## 2020-04-12 NOTE — Telephone Encounter (Signed)
CSW informed by [employer] that pt still in need of Eliquis assistance but has not signed proper paperwork with [facility] so they can send in patient assistance applications for patient.  CSW provided [employer] with 1 week of Eliquis samples to help patient until he goes in to sign paperwork for assistance.  CSW will continue to follow and assist as needed  [name], [facility] Desk#: [phone] Cell#: [phone]

## 2020-04-13 ENCOUNTER — Other Ambulatory Visit (HOSPITAL_COMMUNITY): Payer: Self-pay

## 2020-04-13 NOTE — Progress Notes (Signed)
MADE IN ERROR °

## 2020-04-14 ENCOUNTER — Other Ambulatory Visit: Payer: Self-pay | Admitting: Family Medicine

## 2020-04-14 ENCOUNTER — Telehealth (HOSPITAL_COMMUNITY): Payer: Self-pay | Admitting: Pharmacy Technician

## 2020-04-14 MED FILL — TRUEPLUS PEN NDL 31G X 1/4: 31G X 6 MM | 25 days supply | Qty: 100 | Fill #0

## 2020-04-14 MED FILL — HUMALOG MIX 75-25 KWIKPEN: (75-25) 100 | 25 days supply | Qty: 15 | Fill #2 | Status: TO

## 2020-04-14 MED FILL — GABAPENTIN 300 MG CAPSULE: 300 | 30 days supply | Qty: 120 | Fill #2

## 2020-04-14 MED FILL — SPIRONOLACTONE 25 MG TABLET: 25 | 30 days supply | Qty: 30 | Fill #1

## 2020-04-14 MED FILL — POTASSIUM CL ER 20 MEQ TABL: 20 | 30 days supply | Qty: 60 | Fill #1

## 2020-04-14 MED FILL — QUETIAPINE FUMARATE 200 MG: 200 | 30 days supply | Qty: 30 | Fill #2

## 2020-04-14 NOTE — Telephone Encounter (Signed)
Refill Trueplus Pen (order was discontinued by Kathe Becton)

## 2020-04-14 NOTE — Telephone Encounter (Signed)
Received application for assistance with Eliquis. It did not include the patient's OOP report. Called and spoke with patient. Will fax to [facility] once OOP has been received.  Will follow up.  Charlann Boxer, CPhT

## 2020-04-16 ENCOUNTER — Other Ambulatory Visit: Payer: Self-pay | Admitting: Family Medicine

## 2020-04-16 ENCOUNTER — Other Ambulatory Visit: Payer: Self-pay

## 2020-04-16 ENCOUNTER — Encounter: Payer: Self-pay | Admitting: Family Medicine

## 2020-04-16 ENCOUNTER — Ambulatory Visit (HOSPITAL_COMMUNITY)
Admit: 2020-04-16 | Discharge: 2020-04-16 | Disposition: A | Discharge status: Home / Self Care | Payer: Medicare Other | Attending: Cardiology | Admitting: Cardiology

## 2020-04-16 ENCOUNTER — Ambulatory Visit (INDEPENDENT_AMBULATORY_CARE_PROVIDER_SITE_OTHER): Payer: Medicare Other | Admitting: Family Medicine

## 2020-04-16 VITALS — BP 137/86 | HR 72 | Temp 98.7°F | Ht 74.0 in | Wt 310.4 lb

## 2020-04-16 DIAGNOSIS — R7309 Other abnormal glucose: Secondary | ICD-10-CM | POA: Diagnosis not present

## 2020-04-16 DIAGNOSIS — E1165 Type 2 diabetes mellitus with hyperglycemia: Secondary | ICD-10-CM

## 2020-04-16 DIAGNOSIS — Z794 Long term (current) use of insulin: Secondary | ICD-10-CM | POA: Diagnosis not present

## 2020-04-16 DIAGNOSIS — R739 Hyperglycemia, unspecified: Secondary | ICD-10-CM

## 2020-04-16 DIAGNOSIS — N529 Male erectile dysfunction, unspecified: Secondary | ICD-10-CM

## 2020-04-16 DIAGNOSIS — Z09 Encounter for follow-up examination after completed treatment for conditions other than malignant neoplasm: Secondary | ICD-10-CM

## 2020-04-16 DIAGNOSIS — I479 Paroxysmal tachycardia, unspecified: Secondary | ICD-10-CM

## 2020-04-16 DIAGNOSIS — I5022 Chronic systolic (congestive) heart failure: Secondary | ICD-10-CM | POA: Diagnosis not present

## 2020-04-16 DIAGNOSIS — F419 Anxiety disorder, unspecified: Secondary | ICD-10-CM

## 2020-04-16 LAB — POCT GLYCOSYLATED HEMOGLOBIN (HGB A1C): Hemoglobin A1C: 6.5 % — AB (ref 4.0–5.6)

## 2020-04-16 LAB — BASIC METABOLIC PANEL
Anion gap: 9 (ref 5–15)
BUN: 14 mg/dL (ref 6–20)
CO2: 24 mmol/L (ref 22–32)
Calcium: 8.5 mg/dL — ABNORMAL LOW (ref 8.9–10.3)
Chloride: 104 mmol/L (ref 98–111)
Creatinine, Ser: 1.04 mg/dL (ref 0.61–1.24)
GFR calc Af Amer: >60 mL/min (ref >60)
GFR calc non Af Amer: >60 mL/min (ref >60)
Glucose, Bld: 126 mg/dL — ABNORMAL HIGH (ref 70–99)
Potassium: 3.8 mmol/L (ref 3.5–5.1)
Sodium: 137 mmol/L (ref 135–145)

## 2020-04-16 LAB — GLUCOSE, POCT (MANUAL RESULT ENTRY): POC Glucose: 83 mg/dl (ref 70–99)

## 2020-04-16 MED ORDER — BUPROPION HCL ER (SR) 100 MG PO TB12: 100.0000 mg | ORAL_TABLET | Freq: Two times a day (BID) | ORAL | 6 refills | Status: DC

## 2020-04-16 MED ORDER — SILDENAFIL CITRATE 100 MG PO TABS: 100.0000 mg | ORAL_TABLET | Freq: Every day | ORAL | 3 refills | Status: DC | PRN

## 2020-04-16 MED FILL — BUPROPION HCL SR 100 MG TAB: 100 | 30 days supply | Qty: 60 | Fill #0

## 2020-04-16 MED FILL — SILDENAFIL CITRATE 100 MG T: 100 | 30 days supply | Qty: 4 | Fill #0

## 2020-04-16 NOTE — Progress Notes (Signed)
Patient [name] Follow Up   Subjective:  Patient ID: [name], male    DOB: November 25, 1961  Age: 59 y.o. MRN: [id number]  CC:  Chief Complaint  Patient presents with   Hospitalization Follow-up    Discharged 04/09/2020 Dyspnea & Anemia   Follow-up    DM   Medication Management    HPI [name] is a 59 year old male who presents for Follow Up today.  Past Medical History:  Diagnosis Date   Anxiety    CAD (coronary artery disease)    CHF (congestive heart failure) ([place]) 09/2019   Depression    Diabetes mellitus    Erectile dysfunction 11/2019   H/O right heart catheterization 09/2019   Hypertension    Schizophrenia [facility])      Patient Active Problem List   Diagnosis Date Noted   Anemia due to chronic blood loss    Gastric AVM    AVM (arteriovenous malformation) of small bowel, acquired    Acute on chronic systolic (congestive) heart failure ([place]) 04/05/2020   Anemia 04/05/2020   PAF (paroxysmal atrial fibrillation) ([place]) 04/05/2020   OSA on CPAP 04/05/2020   Syncope and collapse 04/05/2020   Type 2 diabetes mellitus with hyperglycemia, with long-term current use of insulin ([place]) 12/03/2019   Diabetic polyneuropathy associated with type 2 diabetes mellitus ([place]) 12/03/2019   Hyperglycemia 12/03/2019   History of hyperglycemia 12/03/2019   Erectile dysfunction 12/03/2019   CHF (congestive heart failure) ([place]) 10/11/2019   Paranoid schizophrenia, chronic condition ([place]) 01/26/2015   Severe recurrent major depressive disorder with psychotic features ([place]) 01/26/2015   GAD (generalized anxiety disorder) 01/26/2015   OCD (obsessive compulsive disorder) 01/26/2015   Panic disorder without agoraphobia 01/26/2015   Insomnia 01/26/2015   Patient Active Problem List   Diagnosis Date Noted   Anemia due to chronic blood loss    Gastric AVM    AVM (arteriovenous  malformation) of small bowel, acquired    Acute on chronic systolic (congestive) heart failure ([place]) 04/05/2020   Anemia 04/05/2020   PAF (paroxysmal atrial fibrillation) ([place]) 04/05/2020   OSA on CPAP 04/05/2020   Syncope and collapse 04/05/2020   Type 2 diabetes mellitus with hyperglycemia, with long-term current use of insulin ([place]) 12/03/2019   Diabetic polyneuropathy associated with type 2 diabetes mellitus ([place]) 12/03/2019   Hyperglycemia 12/03/2019   History of hyperglycemia 12/03/2019   Erectile dysfunction 12/03/2019   CHF (congestive heart failure) ([place]) 10/11/2019   Paranoid schizophrenia, chronic condition ([place]) 01/26/2015   Severe recurrent major depressive disorder with psychotic features ([place]) 01/26/2015   GAD (generalized anxiety disorder) 01/26/2015   OCD (obsessive compulsive disorder) 01/26/2015   Panic disorder without agoraphobia 01/26/2015   Insomnia 01/26/2015   Current Status: Since his last office visit, he has had a Hospital Admission from 5/10-5/14 for Anemia. He did receive 6 units of blood while hospitalized for blood loss. Today, he is doing well with no complaints. He blood glucose as low range of 80 and high of 180 since his last office visit. He denies fatigue, frequent urination, blurred vision, excessive hunger, excessive thirst, weight gain, weight loss, and poor wound healing. He continues to check his feet regularly. He denies visual changes, chest pain, cough, shortness of breath, heart palpitations, and falls. He has occasional headaches and dizziness with position changes. Denies severe headaches, confusion, seizures, double vision, and blurred vision, nausea and vomiting. He denies fevers, chills, fatigue,  recent infections, weight loss, and night sweats. He has not had any headaches, visual changes, dizziness, and falls. No chest pain, heart palpitations, cough and shortness of breath reported. Denies GI problems such as nausea,  vomiting, diarrhea, and constipation. He has no reports of blood in stools, dysuria and hematuria. No depression or anxiety, and denies suicidal ideations, homicidal ideations, or auditory hallucinations. He is taing all medications as prescribed. He denies pain today.     Past Surgical History:  Procedure Laterality Date   CORONARY STENT PLACEMENT     ENTEROSCOPY N/A 04/08/2020   Procedure: ENTEROSCOPY;  Surgeon: Doran Stabler, MD;  Location: [place];  Service: Gastroenterology;  Laterality: N/A;   HEMOSTASIS CLIP PLACEMENT  04/08/2020   Procedure: HEMOSTASIS CLIP PLACEMENT;  Surgeon: Doran Stabler, MD;  Location: [place];  Service: Gastroenterology;;   HEMOSTASIS CONTROL  04/08/2020   Procedure: HEMOSTASIS CONTROL;  Surgeon: Doran Stabler, MD;  Location: [place];  Service: Gastroenterology;;   HERNIA REPAIR     RIGHT/LEFT HEART CATH AND CORONARY ANGIOGRAPHY N/A 10/14/2019   Procedure: RIGHT/LEFT HEART CATH AND CORONARY ANGIOGRAPHY;  Surgeon: Belva Crome, MD;  Location: [place] [facility];  Service: Cardiovascular;  Laterality: N/A;    Family History  Problem Relation Age of Onset   Heart failure Mother    Mental illness Sister    Mental illness Sister     Social History   Socioeconomic History   Marital status: Legally Separated    Spouse name: Not on file   Number of children: Not on file   Years of education: Not on file   Highest education level: Not on file  Occupational History   Not on file  Tobacco Use   Smoking status: Former Smoker   Smokeless tobacco: Never Used  Substance and Sexual Activity   Alcohol use: No   Drug use: No   Sexual activity: Yes  Other Topics Concern   Not on file  Social History Narrative   Not on file   Social Determinants of Health   Financial Resource Strain: Low Risk    Difficulty of Paying Living Expenses: Not very hard  Food Insecurity: No Food Insecurity   Worried About  Charity fundraiser in the Last Year: Never true   Arboriculturist in the Last Year: Never true  Transportation Needs: Public librarian (Medical): Yes   Lack of Transportation (Non-Medical): Yes  Physical Activity:    Days of Exercise per Week:    Minutes of Exercise per Session:   Stress:    Feeling of Stress :   Social Connections:    Frequency of Communication with Friends and Family:    Frequency of Social Gatherings with Friends and Family:    Attends Religious Services:    Active Member of Clubs or Organizations:    Attends Archivist Meetings:    Marital Status:   Intimate Partner Violence:    Fear of Current or Ex-Partner:    Emotionally Abused:    Physically Abused:    Sexually Abused:     Outpatient Medications Prior to Visit  Medication Sig Dispense Refill   acetaminophen (TYLENOL) 500 MG tablet Take 500 mg by mouth every 6 (six) hours as needed for mild pain or moderate pain.     atorvastatin (LIPITOR) 40 MG tablet Take 1 tablet (40 mg total) by mouth daily at 6 PM. 90 tablet 3  blood glucose meter kit and supplies KIT Dispense based on patient and insurance preference. Use up to four times daily as directed. (FOR ICD-9 250.00, 250.01). (Patient taking differently: 1 each by Other route See admin instructions. Dispense based on patient and insurance preference. Use up to four times daily as directed. (FOR ICD-9 250.00, 250.01).) 1 each 0   carvedilol (COREG) 12.5 MG tablet Take 0.5 tablets (6.25 mg total) by mouth 2 (two) times daily with a meal. 60 tablet 2   cyclobenzaprine (FLEXERIL) 10 MG tablet Take 1 tablet (10 mg total) by mouth 3 (three) times daily as needed for muscle spasms. (Patient not taking: Reported on 04/19/2020) 30 tablet 3   ferrous sulfate 325 (65 FE) MG tablet Take 1 tablet (325 mg total) by mouth daily with breakfast. 30 tablet 3   gabapentin (NEURONTIN) 300 MG capsule Take 2 capsules  (600 mg = total), by mouth, 2 times daily. (Patient taking differently: Take 600 mg by mouth 2 (two) times daily. ) 120 capsule 3   Insulin Lispro Prot & Lispro (HUMALOG MIX 75/25 KWIKPEN) (75-25) 100 UNIT/ML Kwikpen Inject 30 Units into the skin 2 (two) times daily. 15 mL 11   Multiple Vitamins-Minerals (CENTRUM SILVER 50+MEN) TABS Take 1 tablet by mouth daily.     pantoprazole (PROTONIX) 40 MG tablet Take 1 tablet (40 mg total) by mouth 2 (two) times daily. 60 tablet 1   QUEtiapine (SEROQUEL) 100 MG tablet Take 1 tablet (100 mg total) by mouth at bedtime. (Patient taking differently: Take 200 mg by mouth at bedtime. ) 30 tablet 6   sacubitril-valsartan (ENTRESTO) 24-26 MG Take 1 tablet by mouth 2 (two) times daily. 60 tablet 0   spironolactone (ALDACTONE) 25 MG tablet Take 1 tablet (25 mg total) by mouth daily. 30 tablet 11   tetrahydrozoline 0.05 % ophthalmic solution Place 1 drop into both eyes daily.     TRUEPLUS PEN NEEDLES 31G X 6 MM MISC USE AS DIRECTED 100 each 0   sildenafil (VIAGRA) 100 MG tablet Take 1 tablet (100 mg total) by mouth daily as needed for erectile dysfunction. 10 tablet 1   apixaban (ELIQUIS) 5 MG TABS tablet Take 1 tablet (5 mg total) by mouth 2 (two) times daily. 180 tablet 3   furosemide (LASIX) 40 MG tablet Take 1 tablet (40 mg total) by mouth 2 (two) times daily. 60 tablet 11   No facility-administered medications prior to visit.    No Known Allergies  ROS Review of Systems  Constitutional: Negative.   HENT: Negative.   Eyes: Negative.   Respiratory: Negative.   Cardiovascular: Negative.   Gastrointestinal: Negative.   Endocrine: Negative.   Genitourinary: Negative.   Musculoskeletal: Positive for arthralgias (Generalized joint pain).  Skin: Negative.   Allergic/Immunologic: Negative.   Neurological: Positive for dizziness (occasional) and headaches (occasional).  Hematological: Negative.   Psychiatric/Behavioral: Negative.         Objective:    Physical Exam  Constitutional: He appears well-developed and well-nourished.  HENT:  Head: Normocephalic and atraumatic.  Eyes: Conjunctivae are normal.  Cardiovascular: Normal rate, regular rhythm, normal heart sounds and intact distal pulses.  Pulmonary/Chest: Effort normal and breath sounds normal.  Abdominal: Soft. Bowel sounds are normal.  Musculoskeletal:        General: Normal range of motion.     Cervical back: Normal range of motion and neck supple.  Neurological: He is alert. He has normal reflexes.  Skin: Skin is warm and dry.  Psychiatric:  He has a normal mood and affect. His behavior is normal. Judgment and thought content normal.  Nursing note and vitals reviewed.   BP 137/86    Pulse 72    Temp 98.7 F (37.1 C)    Ht _0  (1.88 m)    Wt (!) 310 lb 6.4 oz (140.8 kg)    SpO2 98%    BMI 39.85 kg/m  Wt Readings from Last 3 Encounters:  04/19/20 (!) 306 lb (138.8 kg)  04/16/20 (!) 310 lb 6.4 oz (140.8 kg)  04/12/20 (!) 308 lb (139.7 kg)     Health Maintenance Due  Topic Date Due   Hepatitis C Screening  Never done   FOOT EXAM  Never done   OPHTHALMOLOGY EXAM  Never done   URINE MICROALBUMIN  Never done   TETANUS/TDAP  Never done   COLONOSCOPY  Never done   COVID-19 Vaccine (2 - Pfizer 2-dose series) 04/03/2020    There are no preventive care reminders to display for this patient.  Lab Results  Component Value Date   TSH 4.110 12/02/2019   Lab Results  Component Value Date   WBC 8.5 04/09/2020   HGB 8.7 (L) 04/09/2020   HCT 29.9 (L) 04/09/2020   MCV 75.9 (L) 04/09/2020   PLT 275 04/09/2020   Lab Results  Component Value Date   NA 137 04/16/2020   K 3.8 04/16/2020   CO2 24 04/16/2020   GLUCOSE 126 (H) 04/16/2020   BUN 14 04/16/2020   CREATININE 1.04 04/16/2020   BILITOT 0.8 04/05/2020   ALKPHOS 60 04/05/2020   AST 24 04/05/2020   ALT 26 04/05/2020   PROT 6.8 04/05/2020   ALBUMIN 3.1 (L) 04/05/2020   CALCIUM 8.5 (L)  04/16/2020   ANIONGAP 9 04/16/2020   Lab Results  Component Value Date   CHOL 120 12/02/2019   Lab Results  Component Value Date   HDL 42 12/02/2019   Lab Results  Component Value Date   LDLCALC 66 12/02/2019   Lab Results  Component Value Date   TRIG 51 12/02/2019   Lab Results  Component Value Date   CHOLHDL 2.9 12/02/2019   Lab Results  Component Value Date   HGBA1C 6.5 (A) 04/16/2020      Assessment & Plan:   1. Hospital discharge follow-up  2. Paroxysmal tachycardia (HCC) Stable today.   3. Type 2 diabetes mellitus with hyperglycemia, with long-term current use of insulin ([place]) He will continue medication as prescribed, to decrease foods/beverages high in sugars and carbs and follow Heart Healthy or DASH diet. Increase physical activity to at least 30 minutes cardio exercise daily.  - POCT glycosylated hemoglobin (Hb A1C) - POCT glucose (manual entry)  4. Hemoglobin A1C greater than 9%, indicating poor diabetic control Hgb A1c is stable at 6.5 today. Monitor.  5. Hyperglycemia  6. Anxiety - buPROPion (WELLBUTRIN SR) 100 MG 12 hr tablet; Take 1 tablet (100 mg total) by mouth 2 (two) times daily.  Dispense: 60 tablet; Refill: 6  7. Erectile dysfunction, unspecified erectile dysfunction type - sildenafil (VIAGRA) 100 MG tablet; Take 1 tablet (100 mg total) by mouth daily as needed for erectile dysfunction.  Dispense: 10 tablet; Refill: 3  8. Follow up He will follow up in 3 months.  Meds ordered this encounter  Medications   buPROPion (WELLBUTRIN SR) 100 MG 12 hr tablet    Sig: Take 1 tablet (100 mg total) by mouth 2 (two) times daily.    Dispense:  60 tablet    Refill:  6   sildenafil (VIAGRA) 100 MG tablet    Sig: Take 1 tablet (100 mg total) by mouth daily as needed for erectile dysfunction.    Dispense:  10 tablet    Refill:  3    Orders Placed This Encounter  Procedures   POCT glycosylated hemoglobin (Hb A1C)   POCT glucose (manual  entry)    Referral Orders  No referral(s) requested today    Joel Becton,  MSN, FNP-BC [place] [address] [place], [place] [address] [phone]   Problem List Items Addressed This Visit      Endocrine   Type 2 diabetes mellitus with hyperglycemia, with long-term current use of insulin ([place])   Relevant Orders   POCT glycosylated hemoglobin (Hb A1C) (Completed)   POCT glucose (manual entry) (Completed)     Other   Erectile dysfunction   Relevant Medications   sildenafil (VIAGRA) 100 MG tablet   Hyperglycemia    Other Visit Diagnoses    Hospital discharge follow-up    -  Primary   Paroxysmal tachycardia (HCC)       Relevant Medications   sildenafil (VIAGRA) 100 MG tablet   Hemoglobin A1C greater than 9%, indicating poor diabetic control       Anxiety       Relevant Medications   buPROPion (WELLBUTRIN SR) 100 MG 12 hr tablet   Follow up          Meds ordered this encounter  Medications   buPROPion (WELLBUTRIN SR) 100 MG 12 hr tablet    Sig: Take 1 tablet (100 mg total) by mouth 2 (two) times daily.    Dispense:  60 tablet    Refill:  6   sildenafil (VIAGRA) 100 MG tablet    Sig: Take 1 tablet (100 mg total) by mouth daily as needed for erectile dysfunction.    Dispense:  10 tablet    Refill:  3    Follow-up: Return in about 3 months (around 07/17/2020).    Joel Glatter, FNP

## 2020-04-16 NOTE — Patient Instructions (Signed)
Generalized Anxiety Disorder, Adult Generalized anxiety disorder (GAD) is a mental health disorder. People with this condition constantly worry about everyday events. Unlike normal anxiety, worry related to GAD is not triggered by a specific event. These worries also do not fade or get better with time. GAD interferes with life functions, including relationships, work, and school. GAD can vary from mild to severe. People with severe GAD can have intense waves of anxiety with physical symptoms (panic attacks). What are the causes? The exact cause of GAD is not known. What increases the risk? This condition is more likely to develop in:  Women.  People who have a family history of anxiety disorders.  People who are very shy.  People who experience very stressful life events, such as the death of a loved one.  People who have a very stressful family environment. What are the signs or symptoms? People with GAD often worry excessively about many things in their lives, such as their health and family. They may also be overly concerned about:  Doing well at work.  Being on time.  Natural disasters.  Friendships. Physical symptoms of GAD include:  Fatigue.  Muscle tension or having muscle twitches.  Trembling or feeling shaky.  Being easily startled.  Feeling like your heart is pounding or racing.  Feeling out of breath or like you cannot take a deep breath.  Having trouble falling asleep or staying asleep.  Sweating.  Nausea, diarrhea, or irritable bowel syndrome (IBS).  Headaches.  Trouble concentrating or remembering facts.  Restlessness.  Irritability. How is this diagnosed? Your health care provider can diagnose GAD based on your symptoms and medical history. You will also have a physical exam. The health care provider will ask specific questions about your symptoms, including how severe they are, when they started, and if they come and go. Your health care  provider may ask you about your use of alcohol or drugs, including prescription medicines. Your health care provider may refer you to a mental health specialist for further evaluation. Your health care provider will do a thorough examination and may perform additional tests to rule out other possible causes of your symptoms. To be diagnosed with GAD, a person must have anxiety that:  Is out of his or her control.  Affects several different aspects of his or her life, such as work and relationships.  Causes distress that makes him or her unable to take part in normal activities.  Includes at least three physical symptoms of GAD, such as restlessness, fatigue, trouble concentrating, irritability, muscle tension, or sleep problems. Before your health care provider can confirm a diagnosis of GAD, these symptoms must be present more days than they are not, and they must last for six months or longer. How is this treated? The following therapies are usually used to treat GAD:  Medicine. Antidepressant medicine is usually prescribed for long-term daily control. Antianxiety medicines may be added in severe cases, especially when panic attacks occur.  Talk therapy (psychotherapy). Certain types of talk therapy can be helpful in treating GAD by providing support, education, and guidance. Options include: ? Cognitive behavioral therapy (CBT). People learn coping skills and techniques to ease their anxiety. They learn to identify unrealistic or negative thoughts and behaviors and to replace them with positive ones. ? Acceptance and commitment therapy (ACT). This treatment teaches people how to be mindful as a way to cope with unwanted thoughts and feelings. ? Biofeedback. This process trains you to manage your body's response (  physiological response) through breathing techniques and relaxation methods. You will work with a therapist while machines are used to monitor your physical symptoms.  Stress  management techniques. These include yoga, meditation, and exercise. A mental health specialist can help determine which treatment is best for you. Some people see improvement with one type of therapy. However, other people require a combination of therapies. Follow these instructions at home:  Take over-the-counter and prescription medicines only as told by your health care provider.  Try to maintain a normal routine.  Try to anticipate stressful situations and allow extra time to manage them.  Practice any stress management or self-calming techniques as taught by your health care provider.  Do not punish yourself for setbacks or for not making progress.  Try to recognize your accomplishments, even if they are small.  Keep all follow-up visits as told by your health care provider. This is important. Contact a health care provider if:  Your symptoms do not get better.  Your symptoms get worse.  You have signs of depression, such as: ? A persistently sad, cranky, or irritable mood. ? Loss of enjoyment in activities that used to bring you joy. ? Change in weight or eating. ? Changes in sleeping habits. ? Avoiding friends or family members. ? Loss of energy for normal tasks. ? Feelings of guilt or worthlessness. Get help right away if:  You have serious thoughts about hurting yourself or others. If you ever feel like you may hurt yourself or others, or have thoughts about taking your own life, get help right away. You can go to your nearest emergency department or call:  Your local emergency services (911 in the [place]  A suicide crisis helpline, such as the [facility] at 801-247-0970. This is open 24 hours a day. Summary  Generalized anxiety disorder (GAD) is a mental health disorder that involves worry that is not triggered by a specific event.  People with GAD often worry excessively about many things in their lives, such as their health and  family.  GAD may cause physical symptoms such as restlessness, trouble concentrating, sleep problems, frequent sweating, nausea, diarrhea, headaches, and trembling or muscle twitching.  A mental health specialist can help determine which treatment is best for you. Some people see improvement with one type of therapy. However, other people require a combination of therapies. This information is not intended to replace advice given to you by your health care provider. Make sure you discuss any questions you have with your health care provider. Document Revised: 10/26/2017 Document Reviewed: 10/03/2016 Elsevier Patient Education  [place]. Bupropion tablets (Depression/Mood Disorders) What is this medicine? BUPROPION (byoo PROE pee on) is used to treat depression. This medicine may be used for other purposes; ask your health care provider or pharmacist if you have questions. COMMON BRAND NAME(S): Wellbutrin What should I tell my health care provider before I take this medicine? They need to know if you have any of these conditions:  an eating disorder, such as anorexia or bulimia  bipolar disorder or psychosis  diabetes or high blood sugar, treated with medication  glaucoma  heart disease, previous heart attack, or irregular heart beat  head injury or brain tumor  high blood pressure  kidney or liver disease  seizures  suicidal thoughts or a previous suicide attempt  Tourette's syndrome  weight loss  an unusual or allergic reaction to bupropion, other medicines, foods, dyes, or preservatives  breast-feeding  pregnant or trying to  become pregnant How should I use this medicine? Take this medicine by mouth with a glass of water. Follow the directions on the prescription label. You can take it with or without food. If it upsets your stomach, take it with food. Take your medicine at regular intervals. Do not take your medicine more often than directed. Do not stop  taking this medicine suddenly except upon the advice of your doctor. Stopping this medicine too quickly may cause serious side effects or your condition may worsen. A special MedGuide will be given to you by the pharmacist with each prescription and refill. Be sure to read this information carefully each time. Talk to your pediatrician regarding the use of this medicine in children. Special care may be needed. Overdosage: If you think you have taken too much of this medicine contact a poison control center or emergency room at once. NOTE: This medicine is only for you. Do not share this medicine with others. What if I miss a dose? If you miss a dose, take it as soon as you can. If it is less than four hours to your next dose, take only that dose and skip the missed dose. Do not take double or extra doses. What may interact with this medicine? Do not take this medicine with any of the following medications:  linezolid  MAOIs like Azilect, Carbex, Eldepryl, Marplan, Nardil, and Parnate  methylene blue (injected into a vein)  other medicines that contain bupropion like Zyban This medicine may also interact with the following medications:  alcohol  certain medicines for anxiety or sleep  certain medicines for blood pressure like metoprolol, propranolol  certain medicines for depression or psychotic disturbances  certain medicines for HIV or AIDS like efavirenz, lopinavir, nelfinavir, ritonavir  certain medicines for irregular heart beat like propafenone, flecainide  certain medicines for Parkinson's disease like amantadine, levodopa  certain medicines for seizures like carbamazepine, phenytoin, phenobarbital  cimetidine  clopidogrel  cyclophosphamide  digoxin  furazolidone  isoniazid  nicotine  orphenadrine  procarbazine  steroid medicines like prednisone or cortisone  stimulant medicines for attention disorders, weight loss, or to stay  awake  tamoxifen  theophylline  thiotepa  ticlopidine  tramadol  warfarin This list may not describe all possible interactions. Give your health care provider a list of all the medicines, herbs, non-prescription drugs, or dietary supplements you use. Also tell them if you smoke, drink alcohol, or use illegal drugs. Some items may interact with your medicine. What should I watch for while using this medicine? Tell your doctor if your symptoms do not get better or if they get worse. Visit your doctor or healthcare provider for regular checks on your progress. Because it may take several weeks to see the full effects of this medicine, it is important to continue your treatment as prescribed by your doctor. This medicine may cause serious skin reactions. They can happen weeks to months after starting the medicine. Contact your healthcare provider right away if you notice fevers or flu-like symptoms with a rash. The rash may be red or purple and then turn into blisters or peeling of the skin. Or, you might notice a red rash with swelling of the face, lips or lymph nodes in your neck or under your arms. Patients and their families should watch out for new or worsening thoughts of suicide or depression. Also watch out for sudden changes in feelings such as feeling anxious, agitated, panicky, irritable, hostile, aggressive, impulsive, severely restless, overly excited and hyperactive,  or not being able to sleep. If this happens, especially at the beginning of treatment or after a change in dose, call your healthcare provider. Avoid alcoholic drinks while taking this medicine. Drinking excessive alcoholic beverages, using sleeping or anxiety medicines, or quickly stopping the use of these agents while taking this medicine may increase your risk for a seizure. Do not drive or use heavy machinery until you know how this medicine affects you. This medicine can impair your ability to perform these tasks. Do  not take this medicine close to bedtime. It may prevent you from sleeping. Your mouth may get dry. Chewing sugarless gum or sucking hard candy, and drinking plenty of water may help. Contact your doctor if the problem does not go away or is severe. What side effects may I notice from receiving this medicine? Side effects that you should report to your doctor or health care professional as soon as possible:  allergic reactions like skin rash, itching or hives, swelling of the face, lips, or tongue  breathing problems  changes in vision  confusion  elevated mood, decreased need for sleep, racing thoughts, impulsive behavior  fast or irregular heartbeat  hallucinations, loss of contact with reality  increased blood pressure  rash, fever, and swollen lymph nodes  redness, blistering, peeling, or loosening of the skin, including inside the mouth  seizures  suicidal thoughts or other mood changes  unusually weak or tired  vomiting Side effects that usually do not require medical attention (report to your doctor or health care professional if they continue or are bothersome):  constipation  headache  loss of appetite  nausea  tremors  weight loss This list may not describe all possible side effects. Call your doctor for medical advice about side effects. You may report side effects to FDA at 1-800-FDA-1088. Where should I keep my medicine? Keep out of the reach of children. Store at room temperature between 20 and 25 degrees C (68 and 77 degrees F), away from direct sunlight and moisture. Keep tightly closed. Throw away any unused medicine after the expiration date. NOTE: This sheet is a summary. It may not cover all possible information. If you have questions about this medicine, talk to your doctor, pharmacist, or health care provider.  2020 Elsevier/Gold Standard (2019-02-06 14:02:47)

## 2020-04-19 ENCOUNTER — Other Ambulatory Visit (HOSPITAL_COMMUNITY): Payer: Self-pay

## 2020-04-19 NOTE — Progress Notes (Signed)
Paramedicine Encounter    Patient ID: [name], male    DOB: 06/19/1961, 59 y.o.   MRN: [id number]   Patient Care Team: Nolene Ebbs, MD as PCP - General (Internal Medicine) [name], MD as PCP - Cardiology (Cardiology) [name], MD as PCP - Advanced Heart Failure (Cardiology)  Patient Active Problem List   Diagnosis Date Noted  . Anemia due to chronic blood loss   . Gastric AVM   . AVM (arteriovenous malformation) of small bowel, acquired   . Acute on chronic systolic (congestive) heart failure ([place]) 04/05/2020  . Anemia 04/05/2020  . PAF (paroxysmal atrial fibrillation) ([place]) 04/05/2020  . OSA on CPAP 04/05/2020  . Syncope and collapse 04/05/2020  . Type 2 diabetes mellitus with hyperglycemia, with long-term current use of insulin ([place]) 12/03/2019  . Diabetic polyneuropathy associated with type 2 diabetes mellitus ([place]) 12/03/2019  . Hyperglycemia 12/03/2019  . History of hyperglycemia 12/03/2019  . Erectile dysfunction 12/03/2019  . CHF (congestive heart failure) ([place]) 10/11/2019  . Paranoid schizophrenia, chronic condition ([place]) 01/26/2015  . Severe recurrent major depressive disorder with psychotic features ([place]) 01/26/2015  . GAD (generalized anxiety disorder) 01/26/2015  . OCD (obsessive compulsive disorder) 01/26/2015  . Panic disorder without agoraphobia 01/26/2015  . Insomnia 01/26/2015    Current Outpatient Medications:  .  acetaminophen (TYLENOL) 500 MG tablet, Take 500 mg by mouth every 6 (six) hours as needed for mild pain or moderate pain., Disp: , Rfl:  .  apixaban (ELIQUIS) 5 MG TABS tablet, Take 1 tablet (5 mg total) by mouth 2 (two) times daily., Disp: 180 tablet, Rfl: 3 .  atorvastatin (LIPITOR) 40 MG tablet, Take 1 tablet (40 mg total) by mouth daily at 6 PM., Disp: 90 tablet, Rfl: 3 .  blood glucose meter kit and supplies KIT, Dispense based on patient and insurance preference. Use up to four times daily as directed. (FOR ICD-9  250.00, 250.01). (Patient taking differently: 1 each by Other route See admin instructions. Dispense based on patient and insurance preference. Use up to four times daily as directed. (FOR ICD-9 250.00, 250.01).), Disp: 1 each, Rfl: 0 .  buPROPion (WELLBUTRIN SR) 100 MG 12 hr tablet, Take 1 tablet (100 mg total) by mouth 2 (two) times daily., Disp: 60 tablet, Rfl: 6 .  carvedilol (COREG) 12.5 MG tablet, Take 0.5 tablets (6.25 mg total) by mouth 2 (two) times daily with a meal., Disp: 60 tablet, Rfl: 2 .  ferrous sulfate 325 (65 FE) MG tablet, Take 1 tablet (325 mg total) by mouth daily with breakfast., Disp: 30 tablet, Rfl: 3 .  furosemide (LASIX) 40 MG tablet, Take 1 tablet (40 mg total) by mouth 2 (two) times daily., Disp: 60 tablet, Rfl: 11 .  gabapentin (NEURONTIN) 300 MG capsule, Take 2 capsules (600 mg = total), by mouth, 2 times daily. (Patient taking differently: Take 600 mg by mouth 2 (two) times daily. ), Disp: 120 capsule, Rfl: 3 .  Insulin Lispro Prot & Lispro (HUMALOG MIX 75/25 KWIKPEN) (75-25) 100 UNIT/ML Kwikpen, Inject 30 Units into the skin 2 (two) times daily., Disp: 15 mL, Rfl: 11 .  Multiple Vitamins-Minerals (CENTRUM SILVER 50+MEN) TABS, Take 1 tablet by mouth daily., Disp: , Rfl:  .  pantoprazole (PROTONIX) 40 MG tablet, Take 1 tablet (40 mg total) by mouth 2 (two) times daily., Disp: 60 tablet, Rfl: 1 .  QUEtiapine (SEROQUEL) 100 MG tablet, Take 1 tablet (100 mg total) by mouth at bedtime. (Patient taking  differently: Take 200 mg by mouth at bedtime. ), Disp: 30 tablet, Rfl: 6 .  sacubitril-valsartan (ENTRESTO) 24-26 MG, Take 1 tablet by mouth 2 (two) times daily., Disp: 60 tablet, Rfl: 0 .  sildenafil (VIAGRA) 100 MG tablet, Take 1 tablet (100 mg total) by mouth daily as needed for erectile dysfunction., Disp: 10 tablet, Rfl: 3 .  spironolactone (ALDACTONE) 25 MG tablet, Take 1 tablet (25 mg total) by mouth daily., Disp: 30 tablet, Rfl: 11 .  tetrahydrozoline 0.05 % ophthalmic  solution, Place 1 drop into both eyes daily., Disp: , Rfl:  .  TRUEPLUS PEN NEEDLES 31G X 6 MM MISC, USE AS DIRECTED, Disp: 100 each, Rfl: 0 .  cyclobenzaprine (FLEXERIL) 10 MG tablet, Take 1 tablet (10 mg total) by mouth 3 (three) times daily as needed for muscle spasms. (Patient not taking: Reported on 04/19/2020), Disp: 30 tablet, Rfl: 3 No Known Allergies   Social History   Socioeconomic History  . Marital status: Legally Separated    Spouse name: Not on file  . Number of children: Not on file  . Years of education: Not on file  . Highest education level: Not on file  Occupational History  . Not on file  Tobacco Use  . 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 Smokeless tobacco: Never Used  Substance and Sexual Activity  . Alcohol use: No  . Drug use: No  . Sexual activity: Yes  Other Topics Concern  . Not on file  Social History Narrative  . Not on file   Social Determinants of Health   Financial Resource Strain: Low Risk   . Difficulty of Paying Living Expenses: Not very hard  Food Insecurity: No Food Insecurity  . Worried About Charity fundraiser in the Last Year: Never true  . Ran Out of Food in the Last Year: Never true  Transportation Needs: Unmet Transportation Needs  . Lack of Transportation (Medical): Yes  . Lack of Transportation (Non-Medical): Yes  Physical Activity:   . Days of Exercise per Week:   . Minutes of Exercise per Session:   Stress:   . Feeling of Stress :   Social Connections:   . Frequency of Communication with Friends and Family:   . Frequency of Social Gatherings with Friends and Family:   . Attends Religious Services:   . Active Member of Clubs or Organizations:   . Attends Archivist Meetings:   [name] Kitchen Marital Status:   Intimate Partner Violence:   . Fear of Current or Ex-Partner:   . Emotionally Abused:   [name] Kitchen Physically Abused:   . Sexually Abused:     Physical Exam Vitals reviewed.  Constitutional:      Appearance: He is normal  weight.  HENT:     Head: Normocephalic.     Nose: Nose normal.     Mouth/Throat:     Mouth: Mucous membranes are moist.  Eyes:     Pupils: Pupils are equal, round, and reactive to light.  Cardiovascular:     Rate and Rhythm: Normal rate and regular rhythm.     Pulses: Normal pulses.     Heart sounds: Normal heart sounds.  Pulmonary:     Effort: Pulmonary effort is normal.     Breath sounds: Normal breath sounds.  Abdominal:     Palpations: Abdomen is soft.  Musculoskeletal:        General: Normal range of motion.     Cervical back: Normal range of motion.  Right lower leg: No edema.     Left lower leg: No edema.  Skin:    General: Skin is warm and dry.     Capillary Refill: Capillary refill takes less than 2 seconds.  Neurological:     Mental Status: He is alert. Mental status is at baseline.  Psychiatric:        Mood and Affect: Mood normal.    Arrived for home visit for [facility] who was seated at his kitchen table alert and oriented with no reports of complaints. [name] says he has been feeling much better lately and denied chest pain, dizziness, shortness of breath or difficulty with daily activities. [name] has been compliant with his medications. Medications verified and confirmed. [name] just was started on Wellbutrin by PCP. [name] reports starting same today. Reports feeling fine so far. Pill box filled accordingly. [name] was given samples of Eliquis with copay release being sent over to HF clinic for follow up for payment assistance. Home visit complete. I will see [name] in one week.    Refills: None   CBG- 185     Future Appointments  Date Time Provider [place]  04/22/2020  1:30 PM [facility] PA/NP [facility] None  07/16/2020 10:40 AM Azzie Glatter, FNP [facility] None     ACTION: Home visit completed Next visit planned for one week

## 2020-04-20 ENCOUNTER — Telehealth (HOSPITAL_COMMUNITY): Payer: Self-pay | Admitting: *Deleted

## 2020-04-20 NOTE — Telephone Encounter (Signed)
Pt left VM requesting address to our clinic. I called pt and gave our address and to enter at entrance C.

## 2020-04-21 ENCOUNTER — Telehealth: Payer: Self-pay | Admitting: *Deleted

## 2020-04-21 DIAGNOSIS — G4733 Obstructive sleep apnea (adult) (pediatric): Secondary | ICD-10-CM

## 2020-04-21 NOTE — Telephone Encounter (Signed)
-----   Message from [name], MD sent at 03/15/2020  8:32 AM EDT ----- Please change DME to [facility] and get supplies and a download

## 2020-04-21 NOTE — Telephone Encounter (Signed)
Order faxed to [facility]

## 2020-04-22 ENCOUNTER — Telehealth (HOSPITAL_COMMUNITY): Payer: Self-pay | Admitting: Licensed Clinical Social Worker

## 2020-04-22 ENCOUNTER — Encounter (HOSPITAL_COMMUNITY): Payer: Self-pay

## 2020-04-22 ENCOUNTER — Ambulatory Visit (HOSPITAL_COMMUNITY)
Admission: RE | Admit: 2020-04-22 | Discharge: 2020-04-22 | Disposition: A | Discharge status: Home / Self Care | Payer: Medicare Other | Source: Ambulatory Visit | Attending: Cardiology | Admitting: Cardiology

## 2020-04-22 ENCOUNTER — Other Ambulatory Visit: Payer: Self-pay

## 2020-04-22 VITALS — BP 168/104 | HR 76 | Wt 309.0 lb

## 2020-04-22 DIAGNOSIS — I5022 Chronic systolic (congestive) heart failure: Secondary | ICD-10-CM | POA: Diagnosis not present

## 2020-04-22 DIAGNOSIS — D500 Iron deficiency anemia secondary to blood loss (chronic): Secondary | ICD-10-CM | POA: Insufficient documentation

## 2020-04-22 DIAGNOSIS — E119 Type 2 diabetes mellitus without complications: Secondary | ICD-10-CM | POA: Insufficient documentation

## 2020-04-22 DIAGNOSIS — F329 Major depressive disorder, single episode, unspecified: Secondary | ICD-10-CM | POA: Diagnosis not present

## 2020-04-22 DIAGNOSIS — I480 Paroxysmal atrial fibrillation: Secondary | ICD-10-CM | POA: Diagnosis not present

## 2020-04-22 DIAGNOSIS — Z8249 Family history of ischemic heart disease and other diseases of the circulatory system: Secondary | ICD-10-CM | POA: Diagnosis not present

## 2020-04-22 DIAGNOSIS — G4733 Obstructive sleep apnea (adult) (pediatric): Secondary | ICD-10-CM | POA: Insufficient documentation

## 2020-04-22 DIAGNOSIS — Z7901 Long term (current) use of anticoagulants: Secondary | ICD-10-CM | POA: Diagnosis not present

## 2020-04-22 DIAGNOSIS — Z79899 Other long term (current) drug therapy: Secondary | ICD-10-CM | POA: Insufficient documentation

## 2020-04-22 DIAGNOSIS — Z794 Long term (current) use of insulin: Secondary | ICD-10-CM | POA: Diagnosis not present

## 2020-04-22 DIAGNOSIS — E669 Obesity, unspecified: Secondary | ICD-10-CM | POA: Diagnosis not present

## 2020-04-22 DIAGNOSIS — F419 Anxiety disorder, unspecified: Secondary | ICD-10-CM | POA: Diagnosis not present

## 2020-04-22 DIAGNOSIS — I2510 Atherosclerotic heart disease of native coronary artery without angina pectoris: Secondary | ICD-10-CM | POA: Insufficient documentation

## 2020-04-22 DIAGNOSIS — I110 Hypertensive heart disease with heart failure: Secondary | ICD-10-CM | POA: Diagnosis not present

## 2020-04-22 DIAGNOSIS — F209 Schizophrenia, unspecified: Secondary | ICD-10-CM | POA: Diagnosis not present

## 2020-04-22 DIAGNOSIS — Z87891 Personal history of nicotine dependence: Secondary | ICD-10-CM | POA: Insufficient documentation

## 2020-04-22 LAB — BASIC METABOLIC PANEL
Anion gap: 10 (ref 5–15)
BUN: 10 mg/dL (ref 6–20)
CO2: 27 mmol/L (ref 22–32)
Calcium: 8.8 mg/dL — ABNORMAL LOW (ref 8.9–10.3)
Chloride: 102 mmol/L (ref 98–111)
Creatinine, Ser: 0.92 mg/dL (ref 0.61–1.24)
GFR calc Af Amer: >60 mL/min (ref >60)
GFR calc non Af Amer: >60 mL/min (ref >60)
Glucose, Bld: 187 mg/dL — ABNORMAL HIGH (ref 70–99)
Potassium: 3.3 mmol/L — ABNORMAL LOW (ref 3.5–5.1)
Sodium: 139 mmol/L (ref 135–145)

## 2020-04-22 LAB — CBC
HCT: 36.5 % — ABNORMAL LOW (ref 39.0–52.0)
Hemoglobin: 10.5 g/dL — ABNORMAL LOW (ref 13.0–17.0)
MCH: 22.3 pg — ABNORMAL LOW (ref 26.0–34.0)
MCHC: 28.8 g/dL — ABNORMAL LOW (ref 30.0–36.0)
MCV: 77.5 fL — ABNORMAL LOW (ref 80.0–100.0)
Platelets: 611 10*3/uL — ABNORMAL HIGH (ref 150–400)
RBC: 4.71 MIL/uL (ref 4.22–5.81)
RDW: 22.1 % — ABNORMAL HIGH (ref 11.5–15.5)
WBC: 9.5 10*3/uL (ref 4.0–10.5)
nRBC: 0 % (ref 0.0–0.2)

## 2020-04-22 MED ORDER — ENTRESTO 49-51 MG PO TABS
1.0000 | ORAL_TABLET | Freq: Two times a day (BID) | ORAL | 6 refills | Status: DC
Start: 2020-04-22 — End: 2020-05-18

## 2020-04-22 MED ORDER — CARVEDILOL 12.5 MG PO TABS: 12.5000 mg | ORAL_TABLET | Freq: Two times a day (BID) | ORAL | 6 refills | Status: DC

## 2020-04-22 NOTE — Telephone Encounter (Signed)
Community paramedic brought in Copay expense report from pt pharmacy- CSW sent in to [facility] for review- fax confirmation received  CSW will continue to follow and assist as needed  [name], [facility] Desk#: [phone] Cell#: [phone]

## 2020-04-22 NOTE — Progress Notes (Signed)
Advanced Heart Failure Clinic Note   Referring Physician: PCP: Nolene Ebbs, MD PCP-Cardiologist: [name], MD  AHFC: Dr. Aundra Dubin   Reason for Visit: 1) [facility] f/u for Syncope 2/2 Anemia, 2) Chronic  Systolic HF  HPI:  59 yo obese male with history of chronic systolic CHF, CAD, type 2 diabetes, paroxysmal atrial fibrillation, on chronic anticoagulation therapy w/ Eliquis, h/o severe anemia in 2017 treated at [facility] and schizophrenia,  He was recently directly admitted from [facility] to [facility] on 04/05/20 for syncope and orthostasis and found to have severe anemia w/ hgb of 4.2.   FOBT+. Eliquis was held on admit as well as HF/ BP active meds. Started on IV Protonix.   Received total of 6 units PRBCs during admit.  Hgb improved 4.2>>4.8>>5.3>>7.6>>8.5>>8.8>>8.7.Received IV Lasix between transfusions for volume control.   GI consulted for w/u. EGD 5/13 found 2 bleedingangioectasias in the stomach+ 4 non-bleedingangiodysplastic lesions in the duodenum, all treated w/ APC.Clips (MR conditional) were placed. He was monitored and had no further bleeding. Hgb remained stable. He was started on FeSO4 325 mg daily. GI cleared to resume Eliquis 3 days later.   His BP improved w/ correction anemia. He had no further hypotension/ syncope or near syncope. His HF meds were resumed, but at reduced doses and he tolerated well.   He presents back for f/u. Has been followed closely by paramedicine. He has done well since discharge. No symptoms. Denies any further dizziness, syncope/ near syncope. Denies melena/ hematochezia. No CP. No exertional dyspnea. His wt is up 6 lb since discharge and his BP is elevated. He has had no further hypotension.       Review of Systems: [y] = yes, _0  = no   General: Weight gain _1 ; Weight loss _2 ; Anorexia _3 ; Fatigue _4 ; Fever _5 ; Chills _6 ; Weakness _7   Cardiac: Chest pain/pressure _8 ; Resting SOB _9 ; Exertional SOB _10 ; Orthopnea _11 ; Pedal  Edema _12 ; Palpitations _13 ; Syncope _14 ; Presyncope _15 ; Paroxysmal nocturnal dyspnea_16   Pulmonary: Cough _17 ; Wheezing_18 ; Hemoptysis_19 ; Sputum _20 ; Snoring _21   GI: Vomiting_22 ; Dysphagia_23 ; Melena_24 ; Hematochezia _25 ; Heartburn_26 ; Abdominal pain _27 ; Constipation _28 ; Diarrhea _29 ; BRBPR _30   GU: Hematuria_31 ; Dysuria _32 ; Nocturia_33   Vascular: Pain in legs with walking _34 ; Pain in feet with lying flat _35 ; Non-healing sores _36 ; Stroke _37 ; TIA _38 ; Slurred speech _39 ;  Neuro: Headaches_40 ; Vertigo_41 ; Seizures_42 ; Paresthesias_43 ;Blurred vision _44 ; Diplopia _45 ; Vision changes _46   Ortho/Skin: Arthritis _47 ; Joint pain _48 ; Muscle pain _49 ; Joint swelling _50 ; Back Pain _51 ; Rash _52   Psych: Depression_53 ; Anxiety_54   Heme: Bleeding problems _55 ; Clotting disorders _56 ; Anemia _57   Endocrine: Diabetes _58 ; Thyroid dysfunction_59    Past Medical History:  Diagnosis Date  . Anxiety   . CAD (coronary artery disease)   . CHF (congestive heart failure) ([place]) 09/2019  . Depression   . Diabetes mellitus   . Erectile dysfunction 11/2019  . H/O right heart catheterization 09/2019  . Hypertension   . Schizophrenia ([place])     Current Outpatient Medications  Medication Sig Dispense Refill  . acetaminophen (TYLENOL) 500 MG tablet Take 500 mg by mouth every 6 (six) hours as needed for mild pain or  moderate pain.    [name] Kitchen apixaban (ELIQUIS) 5 MG TABS tablet Take 1 tablet (5 mg total) by mouth 2 (two) times daily. 180 tablet 3  . atorvastatin (LIPITOR) 40 MG tablet Take 1 tablet (40 mg total) by mouth daily at 6 PM. 90 tablet 3  . blood glucose meter kit and supplies KIT Dispense based on patient and insurance preference. Use up to four times daily as directed. (FOR ICD-9 250.00, 250.01). (Patient taking differently: 1 each by Other route See admin instructions. Dispense based on patient and insurance preference. Use up to four times daily as directed. (FOR ICD-9 250.00, 250.01).) 1 each 0  .  buPROPion (WELLBUTRIN SR) 100 MG 12 hr tablet Take 1 tablet (100 mg total) by mouth 2 (two) times daily. 60 tablet 6  . carvedilol (COREG) 12.5 MG tablet Take 0.5 tablets (6.25 mg total) by mouth 2 (two) times daily with a meal. 60 tablet 2  . cyclobenzaprine (FLEXERIL) 10 MG tablet Take 1 tablet (10 mg total) by mouth 3 (three) times daily as needed for muscle spasms. 30 tablet 3  . ferrous sulfate 325 (65 FE) MG tablet Take 1 tablet (325 mg total) by mouth daily with breakfast. 30 tablet 3  . furosemide (LASIX) 40 MG tablet Take 1 tablet (40 mg total) by mouth 2 (two) times daily. 60 tablet 11  . gabapentin (NEURONTIN) 300 MG capsule Take 2 capsules (600 mg = total), by mouth, 2 times daily. (Patient taking differently: Take 600 mg by mouth 2 (two) times daily. ) 120 capsule 3  . Insulin Lispro Prot & Lispro (HUMALOG MIX 75/25 KWIKPEN) (75-25) 100 UNIT/ML Kwikpen Inject 30 Units into the skin 2 (two) times daily. 15 mL 11  . Multiple Vitamins-Minerals (CENTRUM SILVER 50+MEN) TABS Take 1 tablet by mouth daily.    . pantoprazole (PROTONIX) 40 MG tablet Take 1 tablet (40 mg total) by mouth 2 (two) times daily. 60 tablet 1  . QUEtiapine (SEROQUEL) 200 MG tablet Take 200 mg by mouth at bedtime.    . sacubitril-valsartan (ENTRESTO) 24-26 MG Take 1 tablet by mouth 2 (two) times daily. 60 tablet 0  . sildenafil (VIAGRA) 100 MG tablet Take 1 tablet (100 mg total) by mouth daily as needed for erectile dysfunction. 10 tablet 3  . spironolactone (ALDACTONE) 25 MG tablet Take 1 tablet (25 mg total) by mouth daily. 30 tablet 11  . tetrahydrozoline 0.05 % ophthalmic solution Place 1 drop into both eyes daily.    . TRUEPLUS PEN NEEDLES 31G X 6 MM MISC USE AS DIRECTED 100 each 0   No current facility-administered medications for this encounter.    No Known Allergies    Social History   Socioeconomic History  . Marital status: Legally Separated    Spouse name: Not on file  . Number of children: Not on  file  . Years of education: Not on file  . Highest education level: Not on file  Occupational History  . Not on file  Tobacco Use  . 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 Smokeless tobacco: Never Used  Substance and Sexual Activity  . Alcohol use: No  . Drug use: No  . Sexual activity: Yes  Other Topics Concern  . Not on file  Social History Narrative  . Not on file   Social Determinants of Health   Financial Resource Strain: Low Risk   . Difficulty of Paying Living Expenses: Not very hard  Food Insecurity: No Food Insecurity  . Worried  About Running Out of Food in the Last Year: Never true  . Ran Out of Food in the Last Year: Never true  Transportation Needs: Unmet Transportation Needs  . Lack of Transportation (Medical): Yes  . Lack of Transportation (Non-Medical): Yes  Physical Activity:   . Days of Exercise per Week:   . Minutes of Exercise per Session:   Stress:   . Feeling of Stress :   Social Connections:   . Frequency of Communication with Friends and Family:   . Frequency of Social Gatherings with Friends and Family:   . Attends Religious Services:   . Active Member of Clubs or Organizations:   . Attends Archivist Meetings:   [name] Kitchen Marital Status:   Intimate Partner Violence:   . Fear of Current or Ex-Partner:   . Emotionally Abused:   [name] Kitchen Physically Abused:   . Sexually Abused:       Family History  Problem Relation Age of Onset  . Heart failure Mother   . Mental illness Sister   . Mental illness Sister     Vitals:   04/22/20 1357  BP: (!) 168/104  Pulse: 76  SpO2: 96%  Weight: (!) 140.2 kg (309 lb)     PHYSICAL EXAM: General:  Well appearing, obese male. No respiratory difficulty HEENT: normal Neck: supple. no JVD. Carotids 2+ bilat; no bruits. No lymphadenopathy or thyromegaly appreciated. Cor: PMI nondisplaced. Regular rate & rhythm. No rubs, gallops or murmurs. Lungs: clear Abdomen: soft, nontender, nondistended. No  hepatosplenomegaly. No bruits or masses. Good bowel sounds. Extremities: no cyanosis, clubbing, rash, trace bilateral LE edema Neuro: alert & oriented x 3, cranial nerves grossly intact. moves all 4 extremities w/o difficulty. Affect pleasant.  ECG: not performed   ASSESSMENT & PLAN:  1. Recent Anemia due to GI bleeding: Recent admit 5/21 for syncope w/ Hgb of 4. Similar presentation in 2017, treated at [facility]. Per CareEverywhere, EGD / colonoscopy were unrevealing. Hemolysis labs negative. He had a transverse colon polyp removed. Path c/w tubular adenoma.  FOBT+ on admit, and required total of 6 units PRBCs.  - EGD 5/13 showed 2 bleeding angioectasias in the stomach + 4 non-bleeding angiodysplastic lesions in the duodenum, all treated w/ APC.  -  Eliquis has been resumed. He denies any further melena/ hematochezia.  - Check CBC today  - Continue Protonix - Continue FeSO4  2. Syncope: In setting of severe anemia as outlined above. No further syncope. No further hypotension  3. Chronic systolic CHF: Echo in 16/10 with EF 25-30%, Echo repeated 5/21 showing EF 30% with mildly decreased RV systolic function. Suspect mixed ischemic/nonischemic cardiomyopathy.  - Wt starting to trend up, now up 6 lb since discharge. BP elevated. NYHA Class II - Increase Entresto back up to 49-51 bid  - Increase Coreg to 12.5 mg bid - Continue spironolactone 25 mg daily - With persistently low EF, should get ICD. Not CRT candidate. Will refer to EP  4. CAD: Last cath in 11/20 with occluded OM2 stent, 90% D1, severe diffuse disease in the RCA (no intervention).  - stable w/o CP. No exertional dyspnea  - No ASA given apixaban use.  - Continue atorvastatin. Good lipids in 1/21.  5. Atrial fibrillation: Paroxysmal. RRR on exam  - Increase Coreg to 12.5 mg bid  - Continue Eliquis for a/c. Check CBC today   6. DMII: SSI  7. OSA: Continue CPAP qhs.   Repeat BMP in 1 week. F/u w/ pharmD in 3-4  weeks to recheck BP and  further adjust meds    Lyda Jester, PA-C 04/22/20

## 2020-04-22 NOTE — Patient Instructions (Signed)
Increase Carvedilol to 12.5 mg Twice daily   Increase Entresto to 49/51 mg Twice daily   Labs done today, we will call you for abnormal results  Labs needed in 1 week  Please follow up with our heart failure pharmacist in 4-6 weeks  Your physician recommends that you schedule a follow-up appointment in: 3 months  If you have any questions or concerns before your next appointment please send [place] a message through Pl[employer] or call our office at [phone].    TO SPEAK WITH A NURSE SELECT OPTION 2, PLEASE LEAVE A MESSAGE INCLUDING: . YOUR NAME . DATE OF BIRTH . CALL BACK NUMBER . REASON FOR CALL  YOU WILL RECEIVE A CALL BACK THE SAME DAY AS LONG AS YOU CALL BEFORE 4:00 PM  At the [facility], you and your health needs are our priority. As part of our continuing mission to provide you with exceptional heart care, we have created designated Provider Care Teams. These Care Teams include your primary Cardiologist (physician) and Advanced Practice Providers (APPs- Physician Assistants and Nurse Practitioners) who all work together to provide you with the care you need, when you need it.   You may see any of the following providers on your designated Care Team at your next follow up: [name] Kitchen Dr Glori Bickers . Dr Loralie Champagne . Darrick Grinder, NP . Lyda Jester, PA . [name], PharmD   Please be sure to bring in all your medications bottles to every appointment.

## 2020-04-26 ENCOUNTER — Other Ambulatory Visit (HOSPITAL_COMMUNITY): Payer: Self-pay

## 2020-04-26 NOTE — Progress Notes (Signed)
Paramedicine Encounter    Patient ID: [name], male    DOB: 1961-04-02, 59 y.o.   MRN: [id number]   Patient Care Team: Nolene Ebbs, MD as PCP - General (Internal Medicine) [name], MD as PCP - Cardiology (Cardiology) [name], MD as PCP - Advanced Heart Failure (Cardiology)  Patient Active Problem List   Diagnosis Date Noted  . Anemia due to chronic blood loss   . Gastric AVM   . AVM (arteriovenous malformation) of small bowel, acquired   . Acute on chronic systolic (congestive) heart failure ([place]) 04/05/2020  . Anemia 04/05/2020  . PAF (paroxysmal atrial fibrillation) ([place]) 04/05/2020  . OSA on CPAP 04/05/2020  . Syncope and collapse 04/05/2020  . Type 2 diabetes mellitus with hyperglycemia, with long-term current use of insulin ([place]) 12/03/2019  . Diabetic polyneuropathy associated with type 2 diabetes mellitus ([place]) 12/03/2019  . Hyperglycemia 12/03/2019  . History of hyperglycemia 12/03/2019  . Erectile dysfunction 12/03/2019  . CHF (congestive heart failure) ([place]) 10/11/2019  . Paranoid schizophrenia, chronic condition ([place]) 01/26/2015  . Severe recurrent major depressive disorder with psychotic features ([place]) 01/26/2015  . GAD (generalized anxiety disorder) 01/26/2015  . OCD (obsessive compulsive disorder) 01/26/2015  . Panic disorder without agoraphobia 01/26/2015  . Insomnia 01/26/2015    Current Outpatient Medications:  .  acetaminophen (TYLENOL) 500 MG tablet, Take 500 mg by mouth every 6 (six) hours as needed for mild pain or moderate pain., Disp: , Rfl:  .  apixaban (ELIQUIS) 5 MG TABS tablet, Take 1 tablet (5 mg total) by mouth 2 (two) times daily., Disp: 180 tablet, Rfl: 3 .  atorvastatin (LIPITOR) 40 MG tablet, Take 1 tablet (40 mg total) by mouth daily at 6 PM., Disp: 90 tablet, Rfl: 3 .  blood glucose meter kit and supplies KIT, Dispense based on patient and insurance preference. Use up to four times daily as directed. (FOR ICD-9  250.00, 250.01). (Patient taking differently: 1 each by Other route See admin instructions. Dispense based on patient and insurance preference. Use up to four times daily as directed. (FOR ICD-9 250.00, 250.01).), Disp: 1 each, Rfl: 0 .  buPROPion (WELLBUTRIN SR) 100 MG 12 hr tablet, Take 1 tablet (100 mg total) by mouth 2 (two) times daily., Disp: 60 tablet, Rfl: 6 .  carvedilol (COREG) 12.5 MG tablet, Take 1 tablet (12.5 mg total) by mouth 2 (two) times daily with a meal., Disp: 60 tablet, Rfl: 6 .  cyclobenzaprine (FLEXERIL) 10 MG tablet, Take 1 tablet (10 mg total) by mouth 3 (three) times daily as needed for muscle spasms., Disp: 30 tablet, Rfl: 3 .  ferrous sulfate 325 (65 FE) MG tablet, Take 1 tablet (325 mg total) by mouth daily with breakfast., Disp: 30 tablet, Rfl: 3 .  furosemide (LASIX) 40 MG tablet, Take 1 tablet (40 mg total) by mouth 2 (two) times daily., Disp: 60 tablet, Rfl: 11 .  gabapentin (NEURONTIN) 300 MG capsule, Take 2 capsules (600 mg = total), by mouth, 2 times daily. (Patient taking differently: Take 600 mg by mouth 2 (two) times daily. ), Disp: 120 capsule, Rfl: 3 .  Insulin Lispro Prot & Lispro (HUMALOG MIX 75/25 KWIKPEN) (75-25) 100 UNIT/ML Kwikpen, Inject 30 Units into the skin 2 (two) times daily., Disp: 15 mL, Rfl: 11 .  Multiple Vitamins-Minerals (CENTRUM SILVER 50+MEN) TABS, Take 1 tablet by mouth daily., Disp: , Rfl:  .  pantoprazole (PROTONIX) 40 MG tablet, Take 1 tablet (40 mg total) by  mouth 2 (two) times daily., Disp: 60 tablet, Rfl: 1 .  QUEtiapine (SEROQUEL) 200 MG tablet, Take 200 mg by mouth at bedtime., Disp: , Rfl:  .  sildenafil (VIAGRA) 100 MG tablet, Take 1 tablet (100 mg total) by mouth daily as needed for erectile dysfunction., Disp: 10 tablet, Rfl: 3 .  spironolactone (ALDACTONE) 25 MG tablet, Take 1 tablet (25 mg total) by mouth daily., Disp: 30 tablet, Rfl: 11 .  tetrahydrozoline 0.05 % ophthalmic solution, Place 1 drop into both eyes daily., Disp: ,  Rfl:  .  TRUEPLUS PEN NEEDLES 31G X 6 MM MISC, USE AS DIRECTED, Disp: 100 each, Rfl: 0 .  sacubitril-valsartan (ENTRESTO) 49-51 MG, Take 1 tablet by mouth 2 (two) times daily., Disp: 60 tablet, Rfl: 6 No Known Allergies   Social History   Socioeconomic History  . Marital status: Legally Separated    Spouse name: Not on file  . Number of children: Not on file  . Years of education: Not on file  . Highest education level: Not on file  Occupational History  . Not on file  Tobacco Use  . 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 Smokeless tobacco: Never Used  Substance and Sexual Activity  . Alcohol use: No  . Drug use: No  . Sexual activity: Yes  Other Topics Concern  . Not on file  Social History Narrative  . Not on file   Social Determinants of Health   Financial Resource Strain: Low Risk   . Difficulty of Paying Living Expenses: Not very hard  Food Insecurity: No Food Insecurity  . Worried About Charity fundraiser in the Last Year: Never true  . Ran Out of Food in the Last Year: Never true  Transportation Needs: Unmet Transportation Needs  . Lack of Transportation (Medical): Yes  . Lack of Transportation (Non-Medical): Yes  Physical Activity:   . Days of Exercise per Week:   . Minutes of Exercise per Session:   Stress:   . Feeling of Stress :   Social Connections:   . Frequency of Communication with Friends and Family:   . Frequency of Social Gatherings with Friends and Family:   . Attends Religious Services:   . Active Member of Clubs or Organizations:   . Attends Archivist Meetings:   [name] Kitchen Marital Status:   Intimate Partner Violence:   . Fear of Current or Ex-Partner:   . Emotionally Abused:   [name] Kitchen Physically Abused:   . Sexually Abused:     Physical Exam Vitals reviewed.  Constitutional:      Appearance: He is normal weight.  HENT:     Head: Normocephalic.     Nose: Nose normal.     Mouth/Throat:     Mouth: Mucous membranes are moist.  Eyes:      Pupils: Pupils are equal, round, and reactive to light.  Cardiovascular:     Rate and Rhythm: Normal rate and regular rhythm.     Pulses: Normal pulses.     Heart sounds: Normal heart sounds.  Pulmonary:     Effort: Pulmonary effort is normal.  Abdominal:     Palpations: Abdomen is soft.  Musculoskeletal:        General: Normal range of motion.     Cervical back: Normal range of motion and neck supple.     Right lower leg: No edema.     Left lower leg: No edema.  Skin:    General: Skin is warm and dry.  Capillary Refill: Capillary refill takes less than 2 seconds.  Neurological:     Mental Status: He is alert. Mental status is at baseline.  Psychiatric:        Mood and Affect: Mood normal.      Arrived for home visit for [facility] who was alert and oriented seated in his kitchen with no reports of pain, dizziness, shortness of breath, bleeding or trouble eating, sleeping. [name] was compliant with medications this week. Same were verified and confirmed. Pill box filled accordingly. Vitals as noted. [name] has not received new dose of Entresto yet so he will continue taking 24 dose until new dose arrives. [name] also needs Eliquis samples until his new RX arrives. I will bring some next week. Home visit complete.   Refills: NONE  CBG- 256   Future Appointments  Date Time Provider [place]  04/30/2020  1:45 PM [facility] LAB [facility] None  05/18/2020  2:00 PM [facility] PHARMACY [facility] None  07/16/2020 10:40 AM Azzie Glatter, FNP [facility] None  07/28/2020  3:00 PM [name], MD [facility] None     ACTION: Home visit completed Next visit planned for one week

## 2020-04-28 NOTE — Telephone Encounter (Signed)
[facility] needed insurance information. Sent in via fax.  Will follow up.

## 2020-04-29 ENCOUNTER — Telehealth: Payer: Self-pay | Admitting: *Deleted

## 2020-04-29 NOTE — Telephone Encounter (Signed)
-----   Message from [name], MD sent at 04/22/2020  7:22 PM EDT ----- I have not followed him for his sleep apnea so not sure why we are dealing with this  Traci ----- Message ----- From: Freada Bergeron, CMA Sent: 04/22/2020   6:12 PM EDT To: [name], MD  DME wants his sleep study and [facility].  Please advise ----- Message ----- From: [name], MD Sent: 03/15/2020   8:32 AM EDT To: Freada Bergeron, CMA  Please change DME to [facility] and get supplies and a download

## 2020-04-29 NOTE — Telephone Encounter (Signed)
Patient referred for sleep apnea. We have tried unsuccessfully to get his sleep study so we will send him for a home sleep test.

## 2020-04-30 ENCOUNTER — Ambulatory Visit (HOSPITAL_COMMUNITY)
Admission: RE | Admit: 2020-04-30 | Discharge: 2020-04-30 | Disposition: A | Discharge status: Home / Self Care | Payer: Medicare Other | Source: Ambulatory Visit | Attending: Cardiology | Admitting: Cardiology

## 2020-04-30 ENCOUNTER — Other Ambulatory Visit: Payer: Self-pay

## 2020-04-30 DIAGNOSIS — I5022 Chronic systolic (congestive) heart failure: Secondary | ICD-10-CM | POA: Insufficient documentation

## 2020-04-30 LAB — BASIC METABOLIC PANEL
Anion gap: 10 (ref 5–15)
BUN: 14 mg/dL (ref 6–20)
CO2: 30 mmol/L (ref 22–32)
Calcium: 8.5 mg/dL — ABNORMAL LOW (ref 8.9–10.3)
Chloride: 100 mmol/L (ref 98–111)
Creatinine, Ser: 1.06 mg/dL (ref 0.61–1.24)
GFR calc Af Amer: >60 mL/min (ref >60)
GFR calc non Af Amer: >60 mL/min (ref >60)
Glucose, Bld: 202 mg/dL — ABNORMAL HIGH (ref 70–99)
Potassium: 3.8 mmol/L (ref 3.5–5.1)
Sodium: 140 mmol/L (ref 135–145)

## 2020-05-04 ENCOUNTER — Telehealth (HOSPITAL_COMMUNITY): Payer: Self-pay

## 2020-05-04 ENCOUNTER — Other Ambulatory Visit (HOSPITAL_COMMUNITY): Payer: Self-pay

## 2020-05-04 NOTE — Progress Notes (Signed)
Paramedicine Encounter    Patient ID: [name], male    DOB: June 04, 1961, 59 y.o.   MRN: [id number]   Patient Care Team: Nolene Ebbs, MD as PCP - General (Internal Medicine) [name], MD as PCP - Cardiology (Cardiology) [name], MD as PCP - Advanced Heart Failure (Cardiology)  Patient Active Problem List   Diagnosis Date Noted   Anemia due to chronic blood loss    Gastric AVM    AVM (arteriovenous malformation) of small bowel, acquired    Acute on chronic systolic (congestive) heart failure ([place]) 04/05/2020   Anemia 04/05/2020   PAF (paroxysmal atrial fibrillation) ([place]) 04/05/2020   OSA on CPAP 04/05/2020   Syncope and collapse 04/05/2020   Type 2 diabetes mellitus with hyperglycemia, with long-term current use of insulin ([place]) 12/03/2019   Diabetic polyneuropathy associated with type 2 diabetes mellitus ([place]) 12/03/2019   Hyperglycemia 12/03/2019   History of hyperglycemia 12/03/2019   Erectile dysfunction 12/03/2019   CHF (congestive heart failure) ([place]) 10/11/2019   Paranoid schizophrenia, chronic condition ([place]) 01/26/2015   Severe recurrent major depressive disorder with psychotic features ([place]) 01/26/2015   GAD (generalized anxiety disorder) 01/26/2015   OCD (obsessive compulsive disorder) 01/26/2015   Panic disorder without agoraphobia 01/26/2015   Insomnia 01/26/2015    Current Outpatient Medications:    apixaban (ELIQUIS) 5 MG TABS tablet, Take 1 tablet (5 mg total) by mouth 2 (two) times daily., Disp: 180 tablet, Rfl: 3   atorvastatin (LIPITOR) 40 MG tablet, Take 1 tablet (40 mg total) by mouth daily at 6 PM., Disp: 90 tablet, Rfl: 3   blood glucose meter kit and supplies KIT, Dispense based on patient and insurance preference. Use up to four times daily as directed. (FOR ICD-9 250.00, 250.01). (Patient taking differently: 1 each by Other route See admin instructions. Dispense based on patient and insurance  preference. Use up to four times daily as directed. (FOR ICD-9 250.00, 250.01).), Disp: 1 each, Rfl: 0   buPROPion (WELLBUTRIN SR) 100 MG 12 hr tablet, Take 1 tablet (100 mg total) by mouth 2 (two) times daily., Disp: 60 tablet, Rfl: 6   carvedilol (COREG) 12.5 MG tablet, Take 1 tablet (12.5 mg total) by mouth 2 (two) times daily with a meal., Disp: 60 tablet, Rfl: 6   ferrous sulfate 325 (65 FE) MG tablet, Take 1 tablet (325 mg total) by mouth daily with breakfast., Disp: 30 tablet, Rfl: 3   furosemide (LASIX) 40 MG tablet, Take 1 tablet (40 mg total) by mouth 2 (two) times daily., Disp: 60 tablet, Rfl: 11   gabapentin (NEURONTIN) 300 MG capsule, Take 2 capsules (600 mg = total), by mouth, 2 times daily. (Patient taking differently: Take 600 mg by mouth 2 (two) times daily. ), Disp: 120 capsule, Rfl: 3   Insulin Lispro Prot & Lispro (HUMALOG MIX 75/25 KWIKPEN) (75-25) 100 UNIT/ML Kwikpen, Inject 30 Units into the skin 2 (two) times daily., Disp: 15 mL, Rfl: 11   Multiple Vitamins-Minerals (CENTRUM SILVER 50+MEN) TABS, Take 1 tablet by mouth daily., Disp: , Rfl:    pantoprazole (PROTONIX) 40 MG tablet, Take 1 tablet (40 mg total) by mouth 2 (two) times daily., Disp: 60 tablet, Rfl: 1   QUEtiapine (SEROQUEL) 200 MG tablet, Take 200 mg by mouth at bedtime., Disp: , Rfl:    sacubitril-valsartan (ENTRESTO) 49-51 MG, Take 1 tablet by mouth 2 (two) times daily., Disp: 60 tablet, Rfl: 6   spironolactone (ALDACTONE) 25 MG tablet, Take 1  tablet (25 mg total) by mouth daily., Disp: 30 tablet, Rfl: 11   tetrahydrozoline 0.05 % ophthalmic solution, Place 1 drop into both eyes daily., Disp: , Rfl:    TRUEPLUS PEN NEEDLES 31G X 6 MM MISC, USE AS DIRECTED, Disp: 100 each, Rfl: 0   acetaminophen (TYLENOL) 500 MG tablet, Take 500 mg by mouth every 6 (six) hours as needed for mild pain or moderate pain., Disp: , Rfl:    cyclobenzaprine (FLEXERIL) 10 MG tablet, Take 1 tablet (10 mg total) by mouth 3  (three) times daily as needed for muscle spasms. (Patient not taking: Reported on 05/04/2020), Disp: 30 tablet, Rfl: 3   sildenafil (VIAGRA) 100 MG tablet, Take 1 tablet (100 mg total) by mouth daily as needed for erectile dysfunction. (Patient not taking: Reported on 05/04/2020), Disp: 10 tablet, Rfl: 3 No Known Allergies   Social History   Socioeconomic History   Marital status: Legally Separated    Spouse name: Not on file   Number of children: Not on file   Years of education: Not on file   Highest education level: Not on file  Occupational History   Not on file  Tobacco Use   Smoking status: Former Smoker   Smokeless tobacco: Never Used  Substance and Sexual Activity   Alcohol use: No   Drug use: No   Sexual activity: Yes  Other Topics Concern   Not on file  Social History Narrative   Not on file   Social Determinants of Health   Financial Resource Strain: Low Risk    Difficulty of Paying Living Expenses: Not very hard  Food Insecurity: No Food Insecurity   Worried About Charity fundraiser in the Last Year: Never true   Arboriculturist in the Last Year: Never true  Transportation Needs: Unmet Transportation Needs   Lack of Transportation (Medical): Yes   Lack of Transportation (Non-Medical): Yes  Physical Activity:    Days of Exercise per Week:    Minutes of Exercise per Session:   Stress:    Feeling of Stress :   Social Connections:    Frequency of Communication with Friends and Family:    Frequency of Social Gatherings with Friends and Family:    Attends Religious Services:    Active Member of Clubs or Organizations:    Attends Archivist Meetings:    Marital Status:   Intimate Partner Violence:    Fear of Current or Ex-Partner:    Emotionally Abused:    Physically Abused:    Sexually Abused:     Physical Exam Vitals reviewed.  Constitutional:      Appearance: He is normal weight.  HENT:     Head: Normocephalic.      Nose: Nose normal.     Mouth/Throat:     Mouth: Mucous membranes are moist.  Eyes:     Pupils: Pupils are equal, round, and reactive to light.  Cardiovascular:     Rate and Rhythm: Normal rate and regular rhythm.     Pulses: Normal pulses.     Heart sounds: Normal heart sounds.  Pulmonary:     Effort: Pulmonary effort is normal.     Breath sounds: Normal breath sounds.  Abdominal:     Palpations: Abdomen is soft.  Musculoskeletal:        General: Normal range of motion.     Cervical back: Normal range of motion.     Right lower leg: No edema.  Left lower leg: No edema.  Skin:    General: Skin is warm.     Capillary Refill: Capillary refill takes less than 2 seconds.  Neurological:     Mental Status: He is alert. Mental status is at baseline.  Psychiatric:        Mood and Affect: Mood normal.     Arrived for home visit for [name] who was seated on his couch alert and oriented reporting he feels pretty good but noticed his weight has increased over the weekend after having a lot of high sodium and fatty foods at multiple cookouts. [name] stated he has been compliant with his medications but has been eating whatever he wanted not remembering to decrease salt intake. [name] was educated on importance of low sodium and heart healthy foods. [name] agreed with same and stated he will be compliant with diet from now on. Vitals were obtained. Medications were reviewed and confirmed. Pill box filled accordingly. [name] requested an eye exam. I will be attempting to schedule same for him to establish eye care in the area.  [name] agreed with same.  Home visit complete.   Refills  -Bupr. -Pantoprazole  -Insulin         Future Appointments  Date Time Provider [place]  05/18/2020  2:00 PM [facility] PHARMAC[facility] None  07/16/2020 10:40 AM Azzie Glatter, FNP [facility] None  07/28/2020  3:00 PM [name], MD [facility] None     ACTION: Home visit completed Next  visit planned for one week

## 2020-05-04 NOTE — Telephone Encounter (Signed)
Eye Exam with [facility]  6/29 at 1230

## 2020-05-05 NOTE — Telephone Encounter (Addendum)
Patient was previously approved for BMS in Feb 2020. He last got a 90 day supply in May.   Nothing further to be done right now.  Charlann Boxer, CPhT

## 2020-05-11 ENCOUNTER — Other Ambulatory Visit (HOSPITAL_COMMUNITY): Payer: Self-pay

## 2020-05-11 ENCOUNTER — Telehealth (HOSPITAL_COMMUNITY): Payer: Self-pay

## 2020-05-11 NOTE — Progress Notes (Signed)
Paramedicine Encounter    Patient ID: [name], male    DOB: 05/14/1961, 59 y.o.   MRN: [id number]   Patient Care Team: Nolene Ebbs, MD as PCP - General (Internal Medicine) [name], MD as PCP - Cardiology (Cardiology) [name], MD as PCP - Advanced Heart Failure (Cardiology)  Patient Active Problem List   Diagnosis Date Noted  . Anemia due to chronic blood loss   . Gastric AVM   . AVM (arteriovenous malformation) of small bowel, acquired   . Acute on chronic systolic (congestive) heart failure ([place]) 04/05/2020  . Anemia 04/05/2020  . PAF (paroxysmal atrial fibrillation) ([place]) 04/05/2020  . OSA on CPAP 04/05/2020  . Syncope and collapse 04/05/2020  . Type 2 diabetes mellitus with hyperglycemia, with long-term current use of insulin ([place]) 12/03/2019  . Diabetic polyneuropathy associated with type 2 diabetes mellitus ([place]) 12/03/2019  . Hyperglycemia 12/03/2019  . History of hyperglycemia 12/03/2019  . Erectile dysfunction 12/03/2019  . CHF (congestive heart failure) ([place]) 10/11/2019  . Paranoid schizophrenia, chronic condition ([place]) 01/26/2015  . Severe recurrent major depressive disorder with psychotic features ([place]) 01/26/2015  . GAD (generalized anxiety disorder) 01/26/2015  . OCD (obsessive compulsive disorder) 01/26/2015  . Panic disorder without agoraphobia 01/26/2015  . Insomnia 01/26/2015    Current Outpatient Medications:  .  acetaminophen (TYLENOL) 500 MG tablet, Take 500 mg by mouth every 6 (six) hours as needed for mild pain or moderate pain., Disp: , Rfl:  .  atorvastatin (LIPITOR) 40 MG tablet, Take 1 tablet (40 mg total) by mouth daily at 6 PM., Disp: 90 tablet, Rfl: 3 .  blood glucose meter kit and supplies KIT, Dispense based on patient and insurance preference. Use up to four times daily as directed. (FOR ICD-9 250.00, 250.01). (Patient taking differently: 1 each by Other route See admin instructions. Dispense based on patient and  insurance preference. Use up to four times daily as directed. (FOR ICD-9 250.00, 250.01).), Disp: 1 each, Rfl: 0 .  buPROPion (WELLBUTRIN SR) 100 MG 12 hr tablet, Take 1 tablet (100 mg total) by mouth 2 (two) times daily., Disp: 60 tablet, Rfl: 6 .  carvedilol (COREG) 12.5 MG tablet, Take 1 tablet (12.5 mg total) by mouth 2 (two) times daily with a meal., Disp: 60 tablet, Rfl: 6 .  ferrous sulfate 325 (65 FE) MG tablet, Take 1 tablet (325 mg total) by mouth daily with breakfast., Disp: 30 tablet, Rfl: 3 .  furosemide (LASIX) 40 MG tablet, Take 1 tablet (40 mg total) by mouth 2 (two) times daily., Disp: 60 tablet, Rfl: 11 .  gabapentin (NEURONTIN) 300 MG capsule, Take 2 capsules (600 mg = total), by mouth, 2 times daily. (Patient taking differently: Take 600 mg by mouth 2 (two) times daily. ), Disp: 120 capsule, Rfl: 3 .  Insulin Lispro Prot & Lispro (HUMALOG MIX 75/25 KWIKPEN) (75-25) 100 UNIT/ML Kwikpen, Inject 30 Units into the skin 2 (two) times daily., Disp: 15 mL, Rfl: 11 .  Multiple Vitamins-Minerals (CENTRUM SILVER 50+MEN) TABS, Take 1 tablet by mouth daily., Disp: , Rfl:  .  pantoprazole (PROTONIX) 40 MG tablet, Take 1 tablet (40 mg total) by mouth 2 (two) times daily., Disp: 60 tablet, Rfl: 1 .  QUEtiapine (SEROQUEL) 200 MG tablet, Take 200 mg by mouth at bedtime., Disp: , Rfl:  .  sacubitril-valsartan (ENTRESTO) 49-51 MG, Take 1 tablet by mouth 2 (two) times daily., Disp: 60 tablet, Rfl: 6 .  spironolactone (ALDACTONE) 25 MG  tablet, Take 1 tablet (25 mg total) by mouth daily., Disp: 30 tablet, Rfl: 11 .  tetrahydrozoline 0.05 % ophthalmic solution, Place 1 drop into both eyes daily., Disp: , Rfl:  .  TRUEPLUS PEN NEEDLES 31G X 6 MM MISC, USE AS DIRECTED, Disp: 100 each, Rfl: 0 .  apixaban (ELIQUIS) 5 MG TABS tablet, Take 1 tablet (5 mg total) by mouth 2 (two) times daily., Disp: 180 tablet, Rfl: 3 .  cyclobenzaprine (FLEXERIL) 10 MG tablet, Take 1 tablet (10 mg total) by mouth 3 (three)  times daily as needed for muscle spasms. (Patient not taking: Reported on 05/04/2020), Disp: 30 tablet, Rfl: 3 .  sildenafil (VIAGRA) 100 MG tablet, Take 1 tablet (100 mg total) by mouth daily as needed for erectile dysfunction. (Patient not taking: Reported on 05/04/2020), Disp: 10 tablet, Rfl: 3 No Known Allergies   Social History   Socioeconomic History  . Marital status: Legally Separated    Spouse name: Not on file  . Number of children: Not on file  . Years of education: Not on file  . Highest education level: Not on file  Occupational History  . Not on file  Tobacco Use  . 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 Smokeless tobacco: Never Used  Vaping Use  . Vaping Use: Never used  Substance and Sexual Activity  . Alcohol use: No  . Drug use: No  . Sexual activity: Yes  Other Topics Concern  . Not on file  Social History Narrative  . Not on file   Social Determinants of Health   Financial Resource Strain: Low Risk   . Difficulty of Paying Living Expenses: Not very hard  Food Insecurity: No Food Insecurity  . Worried About Charity fundraiser in the Last Year: Never true  . Ran Out of Food in the Last Year: Never true  Transportation Needs: Unmet Transportation Needs  . Lack of Transportation (Medical): Yes  . Lack of Transportation (Non-Medical): Yes  Physical Activity:   . Days of Exercise per Week:   . Minutes of Exercise per Session:   Stress:   . Feeling of Stress :   Social Connections:   . Frequency of Communication with Friends and Family:   . Frequency of Social Gatherings with Friends and Family:   . Attends Religious Services:   . Active Member of Clubs or Organizations:   . Attends Archivist Meetings:   [name] Kitchen Marital Status:   Intimate Partner Violence:   . Fear of Current or Ex-Partner:   . Emotionally Abused:   [name] Kitchen Physically Abused:   . Sexually Abused:     Physical Exam Vitals reviewed.  Constitutional:      Appearance: He is normal weight.   HENT:     Head: Normocephalic.     Nose: Nose normal.     Mouth/Throat:     Mouth: Mucous membranes are moist.  Eyes:     Pupils: Pupils are equal, round, and reactive to light.  Cardiovascular:     Rate and Rhythm: Normal rate and regular rhythm.     Pulses: Normal pulses.     Heart sounds: Normal heart sounds.  Pulmonary:     Effort: Pulmonary effort is normal.     Breath sounds: Normal breath sounds.  Abdominal:     Palpations: Abdomen is soft.  Musculoskeletal:        General: Normal range of motion.     Cervical back: Normal range of motion.  Right lower leg: No edema.     Left lower leg: No edema.  Skin:    General: Skin is warm and dry.     Capillary Refill: Capillary refill takes less than 2 seconds.  Neurological:     Mental Status: He is alert. Mental status is at baseline.  Psychiatric:        Mood and Affect: Mood normal.      Arrived for home visit for [name] who was seated in the kitchen on my arrival. [name] reports he feels great, he went to [facility] today to work out reporting he walked a few laps and did some chest work outs. Vitals were obtained and are as noted in report. Medications were reviewed and confirmed. Pill box filled accordingly.  [name] made aware of upcoming appointments and agreed with same. I will meet [name] next week at pharmacy appointment. [name] agreed.   Refills: Wellbutrin Gabapentin Seroquel    Future Appointments  Date Time Provider [place]  05/18/2020  2:00 PM [facility] PHARMACY [facility] None  07/16/2020 10:40 AM Azzie Glatter, FNP [facility] None  07/28/2020  3:00 PM [name], MD [facility] None     ACTION: Home visit completed Next visit planned for one week

## 2020-05-11 NOTE — Telephone Encounter (Signed)
Spoke to BMS reguarding Eliquis status. BMS reports that a shipment went out to providers office at [facility] on May 12th with a three month supply. It was delivered on May 17th at 1254pm to [place]. I made correction for medication to be delivered to patient instead of provider clinic at [place] [id number] after that three month supply is located and accounted for.   -IF the medication is not located a letter will need to be sent to Management with BMS and approved for patient to receive another shipment prior to refill date.

## 2020-05-12 MED FILL — GABAPENTIN 300 MG CAPSULE: 300 | 30 days supply | Qty: 120 | Fill #3

## 2020-05-12 MED FILL — QUETIAPINE FUMARATE 200 MG: 200 | 30 days supply | Qty: 30 | Fill #3

## 2020-05-18 ENCOUNTER — Other Ambulatory Visit: Payer: Self-pay

## 2020-05-18 ENCOUNTER — Ambulatory Visit (HOSPITAL_COMMUNITY)
Admission: RE | Admit: 2020-05-18 | Discharge: 2020-05-18 | Disposition: A | Discharge status: Home / Self Care | Payer: Medicare Other | Source: Ambulatory Visit | Attending: Cardiology | Admitting: Cardiology

## 2020-05-18 ENCOUNTER — Other Ambulatory Visit (HOSPITAL_COMMUNITY): Payer: Self-pay

## 2020-05-18 VITALS — BP 142/90 | HR 78 | Wt 318.6 lb

## 2020-05-18 DIAGNOSIS — I480 Paroxysmal atrial fibrillation: Secondary | ICD-10-CM | POA: Insufficient documentation

## 2020-05-18 DIAGNOSIS — Z7901 Long term (current) use of anticoagulants: Secondary | ICD-10-CM | POA: Diagnosis not present

## 2020-05-18 DIAGNOSIS — G4733 Obstructive sleep apnea (adult) (pediatric): Secondary | ICD-10-CM | POA: Insufficient documentation

## 2020-05-18 DIAGNOSIS — I5022 Chronic systolic (congestive) heart failure: Secondary | ICD-10-CM | POA: Insufficient documentation

## 2020-05-18 DIAGNOSIS — Z79899 Other long term (current) drug therapy: Secondary | ICD-10-CM | POA: Insufficient documentation

## 2020-05-18 DIAGNOSIS — I2510 Atherosclerotic heart disease of native coronary artery without angina pectoris: Secondary | ICD-10-CM | POA: Diagnosis not present

## 2020-05-18 DIAGNOSIS — Z8719 Personal history of other diseases of the digestive system: Secondary | ICD-10-CM | POA: Insufficient documentation

## 2020-05-18 DIAGNOSIS — E119 Type 2 diabetes mellitus without complications: Secondary | ICD-10-CM | POA: Diagnosis not present

## 2020-05-18 MED ORDER — SACUBITRIL-VALSARTAN 97-103 MG PO TABS: 1.0000 | ORAL_TABLET | Freq: Two times a day (BID) | ORAL | 3 refills | Status: DC

## 2020-05-18 MED FILL — SILDENAFIL CITRATE 100 MG T: 100 | 30 days supply | Qty: 4 | Fill #1

## 2020-05-18 NOTE — Patient Instructions (Addendum)
It was a pleasure seeing you today!  MEDICATIONS: -We are changing your medications today -Increase Entresto to 97/103 mg (1 tablet) twice daily. You may take 2 tablets of the 49/51 strength until you receive the new dose from [employer].  -Call if you have questions about your medications.  NEXT APPOINTMENT: Return to clinic in 3 weeks with Pharmacy Clinic.  In general, to take care of your heart failure: -Limit your fluid intake to 2 Liters (half-gallon) per day.   -Limit your salt intake to ideally 2-3 grams (2000-3000 mg) per day. -Weigh yourself daily and record, and bring that "weight diary" to your next appointment.  (Weight gain of 2-3 pounds in 1 day typically means fluid weight.) -The medications for your heart are to help your heart and help you live longer.   -Please contact us before stopping any of your heart medications.  Call the clinic at [phone] with questions or to reschedule future appointments.

## 2020-05-18 NOTE — Progress Notes (Signed)
Saw [name] in the clinic today with Pharmacist to review medications and care plan.   Weight- 318lbs  BP- 142/90   Today Entresto was increased to 97-103. We will be doing 46-51 BID until new shipment arrives.   Meds were reviewed and confirmed. Pill box filled accordingly.    Refills:  Spironolactone   I will see patient in the home in one week

## 2020-05-18 NOTE — Progress Notes (Signed)
PCP: Nolene Ebbs, MD PCP-Cardiologist: [name], MD  Heart Failure: Dr. Aundra Dubin   HPI:   59 y.o obese male with history of chronic systolic CHF, CAD, type 2 diabetes, paroxysmal atrial fibrillation, on chronic anticoagulation therapy with Eliquis, history of severe anemia in 2017 treated at [facility], and schizophrenia.   He was recently directly admitted from heart failure clinic to [facility] on 04/05/2020 for syncope and orthostasis and found to have severe anemia with Hg of 4.2. FOBT was positive. Eliquis was held on admit as well as HF/ BP medications. He was started on IV Pantoprazole and received a total of 6 units of PRBCs with IV lasix in between transfusions during admission. Hg improved 4.2>>4.8>>5.3>>7.6>>8.5>>8.8>>8.7. GI was consulted for further work up. EGD on 04/08/20 found 2 bleeding angioectasias in the stomach and 4 non-bleeding angiodysplastic lesions in the duodenum, all treated with APC. Clips (MR conditional) were placed. Bleeding had resolved with Hg stabilization. BP improved with no further hypotension/syncope. HF medications at reduced doses and Eliquis was restarted prior to discharge.   Recently presented to HF Clinic with PA-C Lyda Jester on 04/22/2020. He has done well since discharge with close follow-up from paramedicine. Reported no symptoms. Denied any further dizziness, syncope/ near syncope, melena/ hematochezia. He had not experienced any chest pain, exertional dyspnea, or hypotension since hospitalization.     Today he returns to HF clinic with paramedic for pharmacist medication titration. At last visit with PA-C, Delene Loll was increased to 49/51 mg BID and carvedilol was increased to 12.5 mg BID. He reports tolerating medications well with no adverse effects. Overall, he is feeling well. Denies dizziness, fatigue, or lightheadedness. Only reports missing 2-3 doses of medications in the last 30 days, which was confirmed by paramedic. His activity  level has improved. He's able to walk 1/2 - 1 mile without stopping. Additionally, he is weightlifting for exercise. Patient' weight has been stable. He would like to work on controlling diet for improved weight loss. He acknowledges that he needs to reduce chips, crackers, and fried food out of his diet. Has not experienced any chest pain or palpitations. He denies SOB, DOE, LEE or orthopnea/PND. Patient is able to complete all ADLs independently.  [name] Kitchen Shortness of breath/dyspnea on exertion? no  . Orthopnea/PND? no . Edema? no . Lightheadedness/dizziness? no . Daily weights at home? yes . Blood pressure/heart rate monitoring at home? yes . Following low-sodium/fluid-restricted diet? no  HF Medications: - Carvedilol 12.5 mg BID - Entresto 49/51 mg BID - Spironolactone 25 mg daily - Furosemide 40 mg BID  Has the patient been experiencing any side effects to the medications prescribed?  no  Does the patient have any problems obtaining medications due to transportation or finances?   No prescription insurance on file. Patient receives manufacturer assistance for [employer].   Understanding of regimen: good Understanding of indications: good Potential of compliance: good Patient understands to avoid NSAIDs. Patient understands to avoid decongestants.    Pertinent Lab Values 04/30/2020: . Serum creatinine 1.06, BUN 14, Potassium 3.8, Sodium 140   Vital Signs: . Weight: 318.6 lbs (last clinic weight: 318 lbs) . Blood pressure: 142/90 . Heart rate: 78 BPM  Assessment: 1. Recent admission with anemia/syncope due to GI bleeding. Similar presentation in 2017, treated at [facility]. Per CareEverywhere, EGD / colonoscopy were unrevealing. Hemolysis labs were negative. He had a transverse colon polyp that was removed. During most recent hospitalization, FOBT was positive on admit, and required total of 6 units  PRBCs.  - EGD 04/08/20 showed 2 bleeding angioectasias in the stomach + 4  non-bleeding angiodysplastic lesions in the duodenum, all treated with APC.  -  Eliquis has been resumed. He denies any further melena/ hematochezia.  - Continue pantoprazole - Continue ferrous sulfate  2. Syncope: In setting of severe anemia as outlined above. No further syncope. No further hypotension  3. Chronic systolic CHF: Echo in 82/42 with EF 25-30%, Echo repeated 04/16/20 showed EF of 30% with mildly decreased RV systolic function. MD suspects mixed ischemic/nonischemic cardiomyopathy. - BP slightly elevated today (142/90). Patient euvolemic on exam. NYHA Class II. - Continue furosemide 40 mg BID - Continue carvedilol 12.5 mg BID - Increase Entresto to 97-103 mg BID. Repeat BMET in 3 weeks.   - Continue spironolactone 25 mg daily - Unable to add BiDil due to patient currently taking PRN sildenafil for ED. - MD to refer patient for ICD 4. CAD: Last cath in 11/20 with occluded OM2 stent, 90% D1, severe diffuse disease in the RCA (no intervention).  - stable w/o CP. No exertional dyspnea  - No ASA due to current apixaban use.  - Continue atorvastatin. Lipids improved 11/2019  5. Paroxysmal atrial fibrillation:  - Continue carvedilol 12.5 mg BID  - Continue Eliquis for anticoagulation. Last CBC was stable 6. DMII: management per family medicine 7. OSA: Continue CPAP nightly   Plan: 1) Medication changes: Based on clinical presentation, vital signs and recent labs will increase Entresto to 97/103 mg BID. 2) Labs: Obtain repeat BMET at next visit 3) Follow-up: pharmacy HF clinic visit scheduled in 3 weeks  Sherren Kerns, PharmD PGY1 [place] Resident  [name], PharmD, BCPS, [facility], CPP Heart Failure Clinic Pharmacist [phone]

## 2020-05-19 ENCOUNTER — Encounter: Payer: Self-pay | Admitting: *Deleted

## 2020-05-19 MED FILL — SPIRONOLACTONE 25 MG TABLET: 25 | 30 days supply | Qty: 30 | Fill #2

## 2020-05-19 NOTE — Telephone Encounter (Signed)
Will send letter to patient letting him know that we have tried multiple times to get his records from [place] and have been unsuccessful.  He may try and get the records himself to get to [place] to help him obtain his equipment or we can have him do a home sleep study to restart the process.  Letter ask the patient to call Gae Bon once he receives to let us know how he would like to proceed.

## 2020-05-19 NOTE — Telephone Encounter (Signed)
Letter sent to patient.

## 2020-05-25 ENCOUNTER — Other Ambulatory Visit (HOSPITAL_COMMUNITY): Payer: Self-pay

## 2020-05-25 DIAGNOSIS — E119 Type 2 diabetes mellitus without complications: Secondary | ICD-10-CM | POA: Diagnosis not present

## 2020-05-25 DIAGNOSIS — H2513 Age-related nuclear cataract, bilateral: Secondary | ICD-10-CM | POA: Diagnosis not present

## 2020-05-25 DIAGNOSIS — H40013 Open angle with borderline findings, low risk, bilateral: Secondary | ICD-10-CM | POA: Diagnosis not present

## 2020-05-25 DIAGNOSIS — H04123 Dry eye syndrome of bilateral lacrimal glands: Secondary | ICD-10-CM | POA: Diagnosis not present

## 2020-05-25 DIAGNOSIS — Z794 Long term (current) use of insulin: Secondary | ICD-10-CM | POA: Diagnosis not present

## 2020-05-25 NOTE — Progress Notes (Signed)
Paramedicine Encounter    Patient ID: [name], male    DOB: October 05, 1961, 59 y.o.   MRN: [id number]   Patient Care Team: Nolene Ebbs, MD as PCP - General (Internal Medicine) [name], MD as PCP - Cardiology (Cardiology) [name], MD as PCP - Advanced Heart Failure (Cardiology)  Patient Active Problem List   Diagnosis Date Noted  . Anemia due to chronic blood loss   . Gastric AVM   . AVM (arteriovenous malformation) of small bowel, acquired   . Acute on chronic systolic (congestive) heart failure ([place]) 04/05/2020  . Anemia 04/05/2020  . PAF (paroxysmal atrial fibrillation) ([place]) 04/05/2020  . OSA on CPAP 04/05/2020  . Syncope and collapse 04/05/2020  . Type 2 diabetes mellitus with hyperglycemia, with long-term current use of insulin ([employer]) 12/03/2019  . Diabetic polyneuropathy associated with type 2 diabetes mellitus ([place]) 12/03/2019  . Hyperglycemia 12/03/2019  . History of hyperglycemia 12/03/2019  . Erectile dysfunction 12/03/2019  . CHF (congestive heart failure) ([place]) 10/11/2019  . Paranoid schizophrenia, chronic condition ([place]) 01/26/2015  . Severe recurrent major depressive disorder with psychotic features ([place]) 01/26/2015  . GAD (generalized anxiety disorder) 01/26/2015  . OCD (obsessive compulsive disorder) 01/26/2015  . Panic disorder without agoraphobia 01/26/2015  . Insomnia 01/26/2015    Current Outpatient Medications:  .  acetaminophen (TYLENOL) 500 MG tablet, Take 500 mg by mouth every 6 (six) hours as needed for mild pain or moderate pain., Disp: , Rfl:  .  apixaban (ELIQUIS) 5 MG TABS tablet, Take 1 tablet (5 mg total) by mouth 2 (two) times daily., Disp: 180 tablet, Rfl: 3 .  atorvastatin (LIPITOR) 40 MG tablet, Take 1 tablet (40 mg total) by mouth daily at 6 PM., Disp: 90 tablet, Rfl: 3 .  Black Currant Seed Oil 500 MG CAPS, Take by mouth once. Takes one capsule daily in the morning., Disp: , Rfl:  .  blood glucose meter kit and  supplies KIT, Dispense based on patient and insurance preference. Use up to four times daily as directed. (FOR ICD-9 250.00, 250.01). (Patient taking differently: 1 each by Other route See admin instructions. Dispense based on patient and insurance preference. Use up to four times daily as directed. (FOR ICD-9 250.00, 250.01).), Disp: 1 each, Rfl: 0 .  buPROPion (WELLBUTRIN SR) 100 MG 12 hr tablet, Take 1 tablet (100 mg total) by mouth 2 (two) times daily., Disp: 60 tablet, Rfl: 6 .  carvedilol (COREG) 12.5 MG tablet, Take 1 tablet (12.5 mg total) by mouth 2 (two) times daily with a meal., Disp: 60 tablet, Rfl: 6 .  cyclobenzaprine (FLEXERIL) 10 MG tablet, Take 1 tablet (10 mg total) by mouth 3 (three) times daily as needed for muscle spasms., Disp: 30 tablet, Rfl: 3 .  ferrous sulfate 325 (65 FE) MG tablet, Take 1 tablet (325 mg total) by mouth daily with breakfast., Disp: 30 tablet, Rfl: 3 .  furosemide (LASIX) 40 MG tablet, Take 1 tablet (40 mg total) by mouth 2 (two) times daily., Disp: 60 tablet, Rfl: 11 .  gabapentin (NEURONTIN) 300 MG capsule, Take 2 capsules (600 mg = total), by mouth, 2 times daily. (Patient taking differently: Take 600 mg by mouth 2 (two) times daily. ), Disp: 120 capsule, Rfl: 3 .  Insulin Lispro Prot & Lispro (HUMALOG MIX 75/25 KWIKPEN) (75-25) 100 UNIT/ML Kwikpen, Inject 30 Units into the skin 2 (two) times daily., Disp: 15 mL, Rfl: 11 .  Multiple Vitamins-Minerals (CENTRUM SILVER 50+MEN) TABS,  Take 1 tablet by mouth daily., Disp: , Rfl:  .  NON FORMULARY, Take 1 capsule by mouth daily. Takes [employer] daily, Disp: , Rfl:  .  pantoprazole (PROTONIX) 40 MG tablet, Take 1 tablet (40 mg total) by mouth 2 (two) times daily., Disp: 60 tablet, Rfl: 1 .  QC TUMERIC COMPLEX 500 MG CAPS, Take by mouth once. Takes one capsule daily in the morning., Disp: , Rfl:  .  QUEtiapine (SEROQUEL) 200 MG tablet, Take 200 mg by mouth at bedtime., Disp: , Rfl:  .  sacubitril-valsartan (ENTRESTO)  97-103 MG, Take 1 tablet by mouth 2 (two) times daily., Disp: 180 tablet, Rfl: 3 .  sildenafil (VIAGRA) 100 MG tablet, Take 1 tablet (100 mg total) by mouth daily as needed for erectile dysfunction., Disp: 10 tablet, Rfl: 3 .  spironolactone (ALDACTONE) 25 MG tablet, Take 1 tablet (25 mg total) by mouth daily., Disp: 30 tablet, Rfl: 11 .  tetrahydrozoline 0.05 % ophthalmic solution, Place 1 drop into both eyes daily., Disp: , Rfl:  .  TRUEPLUS PEN NEEDLES 31G X 6 MM MISC, USE AS DIRECTED, Disp: 100 each, Rfl: 0 No Known Allergies   Social History   Socioeconomic History  . Marital status: Legally Separated    Spouse name: Not on file  . Number of children: Not on file  . Years of education: Not on file  . Highest education level: Not on file  Occupational History  . Not on file  Tobacco Use  . 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 Smokeless tobacco: Never Used  Vaping Use  . Vaping Use: Never used  Substance and Sexual Activity  . Alcohol use: No  . Drug use: No  . Sexual activity: Yes  Other Topics Concern  . Not on file  Social History Narrative  . Not on file   Social Determinants of Health   Financial Resource Strain: Low Risk   . Difficulty of Paying Living Expenses: Not very hard  Food Insecurity: No Food Insecurity  . Worried About Charity fundraiser in the Last Year: Never true  . Ran Out of Food in the Last Year: Never true  Transportation Needs: Unmet Transportation Needs  . Lack of Transportation (Medical): Yes  . Lack of Transportation (Non-Medical): Yes  Physical Activity:   . Days of Exercise per Week:   . Minutes of Exercise per Session:   Stress:   . Feeling of Stress :   Social Connections:   . Frequency of Communication with Friends and Family:   . Frequency of Social Gatherings with Friends and Family:   . Attends Religious Services:   . Active Member of Clubs or Organizations:   . Attends Archivist Meetings:   [name] Kitchen Marital Status:    Intimate Partner Violence:   . Fear of Current or Ex-Partner:   . Emotionally Abused:   [name] Kitchen Physically Abused:   . Sexually Abused:     Physical Exam Vitals reviewed.  HENT:     Head: Normocephalic.     Nose: Nose normal.     Mouth/Throat:     Mouth: Mucous membranes are moist.     Pharynx: Oropharynx is clear.  Eyes:     Pupils: Pupils are equal, round, and reactive to light.  Cardiovascular:     Rate and Rhythm: Normal rate and regular rhythm.     Pulses: Normal pulses.     Heart sounds: Normal heart sounds.  Pulmonary:     Effort:  Pulmonary effort is normal.     Breath sounds: Normal breath sounds.  Abdominal:     General: Abdomen is flat.     Palpations: Abdomen is soft.  Musculoskeletal:        General: Normal range of motion.     Cervical back: Normal range of motion.     Right lower leg: No edema.     Left lower leg: No edema.  Skin:    General: Skin is warm.  Neurological:     Mental Status: He is alert. Mental status is at baseline.  Psychiatric:        Mood and Affect: Mood normal.     Arrived for home visit for [facility] who was seated on his couch alert and oriented with no complaints of pain. [name] did state he felt as if he had some fluid on him this week because he has been a little more short of breath when doing activities. [name] said he has been drinking way more water than he's supposed to. No edema noted upon assessment. Lung sounds clear. [name] was informed to be sure to limit intake of fluids, he agreed and agreed to be compliant with medications. Vitals were obtained and are as noted. Medications were reviewed and confirmed. Pill box filled accordingly. [name] has eye appointment at Dr. Zenia Resides office today at 1230. I plan to see [name] in one week. Home visit complete.   Refills: NONE     Future Appointments  Date Time Provider [place]  06/10/2020 12:00 PM [facility] PHARMACY [facility] None  07/16/2020 10:40 AM Azzie Glatter, FNP [facility] None   07/28/2020  3:00 PM [name], MD [facility] None     ACTION: Home visit completed Next visit planned for one week

## 2020-05-28 NOTE — Progress Notes (Addendum)
PCP: Nolene Ebbs, MD PCP-Cardiologist: [name], MD  Heart Failure: Dr. Aundra Dubin   HPI:   59 y.o obese male with history of chronic systolic CHF, CAD, type 2 diabetes, paroxysmal atrial fibrillation, on chronic anticoagulation therapy with Eliquis, history of severe anemia in 2017 treated at [facility], and schizophrenia.   He was recently directly admitted from heart failure clinic to [facility] on 04/05/2020 for syncope and orthostasis and found to have severe anemia with Hg of 4.2. FOBT was positive. Eliquis was held on admit as well as HF/ BP medications. He was started on IV Pantoprazole and received a total of 6 units of PRBCs with IV lasix in between transfusions during admission. Hg improved 4.2>>4.8>>5.3>>7.6>>8.5>>8.8>>8.7. GI was consulted for further work up. EGD on 04/08/20 found 2 bleeding angioectasias in the stomach and 4 non-bleeding angiodysplastic lesions in the duodenum, all treated with APC. Clips (MR conditional) were placed. Bleeding had resolved with Hg stabilization. BP improved with no further hypotension/syncope. HF medications at reduced doses and Eliquis was restarted prior to discharge.   Recently presented to HF Clinic with PA-C Lyda Jester on 04/22/2020. He has done well since discharge with close follow-up from paramedicine. Reported no symptoms. Denied any further dizziness, syncope/ near syncope, melena/ hematochezia. He had not experienced any chest pain, exertional dyspnea, or hypotension since hospitalization.      Today he returns to HF clinic for pharmacist medication titration. At recent visits to clinic, Delene Loll was increased to 97/103 mg BID and carvedilol was increased to 12.5 mg BID. Overall he is feeling poorly today.  He is noticeably SOB while sitting. He is up 20 lbs since last clinic visit. Has 2+ bilateral LEE, abdominal bloating/fullness and has swelling around his eyes. Additionally, he has been waking up every night for the last 1.5-2  weeks with SOB. Sleeping on 2 pillows, which is stable. He says he has been feeling poorly for the last 2 weeks. He thinks he has gained excess fluid from eating poorly (lots of salty foods such as chips and fast food) in addition to drinking lots of fluids.  Drinking 5 bottles of water, 2 glasses of milk and juice per day at least. No dizziness, lightheadedness or palpitations. Taking all medications as prescribed with help from paramedicine Nira Conn).     HF Medications: - Carvedilol 12.5 mg BID - Entresto 97/103 mg BID - Spironolactone 25 mg daily - Furosemide 40 mg BID  Has the patient been experiencing any side effects to the medications prescribed?  no  Does the patient have any problems obtaining medications due to transportation or finances?   No prescription insurance on file. Patient receives manufacturer assistance for [employer].   Understanding of regimen: poor Understanding of indications: poor Potential of compliance: good - with help from paramedicine Patient understands to avoid NSAIDs. Patient understands to avoid decongestants.    Pertinent Lab Values: . Serum creatinine 1.58, BUN 26, Potassium 4.2, Sodium 138   Vital Signs: . Weight: 340.4 lbs (last clinic weight: 318.6 lbs) . Blood pressure: 122/74 . Heart rate: 84 BPM  Assessment: 1. Recent admission with anemia/syncope due to GI bleeding. Similar presentation in 2017, treated at [facility]. Per CareEverywhere, EGD / colonoscopy were unrevealing. Hemolysis labs were negative. He had a transverse colon polyp that was removed. During most recent hospitalization, FOBT was positive on admit, and required total of 6 units PRBCs.  - EGD 04/08/20 showed 2 bleeding angioectasias in the stomach + 4 non-bleeding angiodysplastic lesions in  the duodenum, all treated with APC.  -  Eliquis has been resumed. He denies any further melena/ hematochezia.  - Continue pantoprazole - Continue ferrous sulfate  2. Syncope: In  setting of severe anemia as outlined above. No further syncope. No further hypotension  3. Chronic systolic CHF: Echo in 87/56 with EF 25-30%, Echo repeated 04/16/20 showed EF of 30% with mildly decreased RV systolic function. MD suspects mixed ischemic/nonischemic cardiomyopathy. - NYHA III-IV, volume overloaded on exam. Weight up 20 lbs from last visit and SOB at rest. Likely related to dietary excursions and excess fluid intake.  - Per Dr, Aundra Dubin, Furosemide 80 mg IV once and potassium chloride 40 mg once were given in clinic today. He was then instructed to increase furosemide to 80 mg BID x3 days, then start furosemide 80 mg AM and 40 mg PM. He will also start potassium chloride 20 mEq daily.  - Continue carvedilol 12.5 mg BID - Continue Entresto 97-103 mg BID. - Continue spironolactone 25 mg daily - Previously referred for ICD - We discussed limiting fluid intake and avoiding salty foods.  4. CAD: Last cath in 11/20 with occluded OM2 stent, 90% D1, severe diffuse disease in the RCA (no intervention).  - stable w/o CP. No exertional dyspnea  - No ASA due to current apixaban use.  - Continue atorvastatin. Lipids improved 11/2019  5. Paroxysmal atrial fibrillation:  - Continue carvedilol 12.5 mg BID  - Continue Eliquis for anticoagulation. Last CBC was stable 6. DMII: management per family medicine 7. OSA: Continue CPAP nightly   Plan: 1) Medication changes: Based on clinical presentation, vital signs and recent labs, furosemide 80 mg IV once and potassium chloride 40 mEq once were given in clinic today. Will also have him increase furosemide to 80 mg BID for 3 days then start furosemide 80 mg AM and 40 mg PM. Start potassium chloride 20 mEq daily.  2) Labs: Scr 1.58, K 4.2 3) Follow-up: Paramedicine will see him tomorrow. Follow up in 2 weeks with NP/PA Clinic.   [name], PharmD, BCPS, BCCP, CPP Heart Failure Clinic Pharmacist [phone]

## 2020-06-01 ENCOUNTER — Other Ambulatory Visit (HOSPITAL_COMMUNITY): Payer: Self-pay

## 2020-06-01 NOTE — Progress Notes (Signed)
Paramedicine Encounter    Patient ID: [name], male    DOB: May 28, 1961, 60 y.o.   MRN: [id number]   Patient Care Team: Nolene Ebbs, MD as PCP - General (Internal Medicine) [name], MD as PCP - Cardiology (Cardiology) [name], MD as PCP - Advanced Heart Failure (Cardiology)  Patient Active Problem List   Diagnosis Date Noted  . Anemia due to chronic blood loss   . Gastric AVM   . AVM (arteriovenous malformation) of small bowel, acquired   . Acute on chronic systolic (congestive) heart failure ([place]) 04/05/2020  . Anemia 04/05/2020  . PAF (paroxysmal atrial fibrillation) ([place]) 04/05/2020  . OSA on CPAP 04/05/2020  . Syncope and collapse 04/05/2020  . Type 2 diabetes mellitus with hyperglycemia, with long-term current use of insulin ([place]) 12/03/2019  . Diabetic polyneuropathy associated with type 2 diabetes mellitus ([place]) 12/03/2019  . Hyperglycemia 12/03/2019  . History of hyperglycemia 12/03/2019  . Erectile dysfunction 12/03/2019  . CHF (congestive heart failure) ([place]) 10/11/2019  . Paranoid schizophrenia, chronic condition ([place]) 01/26/2015  . Severe recurrent major depressive disorder with psychotic features ([place] ) 01/26/2015  . GAD (generalized anxiety disorder) 01/26/2015  . OCD (obsessive compulsive disorder) 01/26/2015  . Panic disorder without agoraphobia 01/26/2015  . Insomnia 01/26/2015    Current Outpatient Medications:  .  acetaminophen (TYLENOL) 500 MG tablet, Take 500 mg by mouth every 6 (six) hours as needed for mild pain or moderate pain., Disp: , Rfl:  .  apixaban (ELIQUIS) 5 MG TABS tablet, Take 1 tablet (5 mg total) by mouth 2 (two) times daily., Disp: 180 tablet, Rfl: 3 .  atorvastatin (LIPITOR) 40 MG tablet, Take 1 tablet (40 mg total) by mouth daily at 6 PM., Disp: 90 tablet, Rfl: 3 .  Black Currant Seed Oil 500 MG CAPS, Take by mouth once. Takes one capsule daily in the morning., Disp: , Rfl:  .  blood glucose meter kit and  supplies KIT, Dispense based on patient and insurance preference. Use up to four times daily as directed. (FOR ICD-9 250.00, 250.01). (Patient taking differently: 1 each by Other route See admin instructions. Dispense based on patient and insurance preference. Use up to four times daily as directed. (FOR ICD-9 250.00, 250.01).), Disp: 1 each, Rfl: 0 .  buPROPion (WELLBUTRIN SR) 100 MG 12 hr tablet, Take 1 tablet (100 mg total) by mouth 2 (two) times daily. (Patient not taking: Reported on 05/25/2020), Disp: 60 tablet, Rfl: 6 .  carvedilol (COREG) 12.5 MG tablet, Take 1 tablet (12.5 mg total) by mouth 2 (two) times daily with a meal., Disp: 60 tablet, Rfl: 6 .  cyclobenzaprine (FLEXERIL) 10 MG tablet, Take 1 tablet (10 mg total) by mouth 3 (three) times daily as needed for muscle spasms. (Patient not taking: Reported on 05/25/2020), Disp: 30 tablet, Rfl: 3 .  ferrous sulfate 325 (65 FE) MG tablet, Take 1 tablet (325 mg total) by mouth daily with breakfast., Disp: 30 tablet, Rfl: 3 .  furosemide (LASIX) 40 MG tablet, Take 1 tablet (40 mg total) by mouth 2 (two) times daily., Disp: 60 tablet, Rfl: 11 .  gabapentin (NEURONTIN) 300 MG capsule, Take 2 capsules (600 mg = total), by mouth, 2 times daily. (Patient taking differently: Take 600 mg by mouth 2 (two) times daily. ), Disp: 120 capsule, Rfl: 3 .  Insulin Lispro Prot & Lispro (HUMALOG MIX 75/25 KWIKPEN) (75-25) 100 UNIT/ML Kwikpen, Inject 30 Units into the skin 2 (two) times daily., Disp:  15 mL, Rfl: 11 .  Multiple Vitamins-Minerals (CENTRUM SILVER 50+MEN) TABS, Take 1 tablet by mouth daily., Disp: , Rfl:  .  NON FORMULARY, Take 1 capsule by mouth daily. Takes [employer] daily, Disp: , Rfl:  .  pantoprazole (PROTONIX) 40 MG tablet, Take 1 tablet (40 mg total) by mouth 2 (two) times daily., Disp: 60 tablet, Rfl: 1 .  QC TUMERIC COMPLEX 500 MG CAPS, Take by mouth once. Takes one capsule daily in the morning. (Patient not taking: Reported on 05/25/2020), Disp: ,  Rfl:  .  QUEtiapine (SEROQUEL) 200 MG tablet, Take 200 mg by mouth at bedtime., Disp: , Rfl:  .  sacubitril-valsartan (ENTRESTO) 97-103 MG, Take 1 tablet by mouth 2 (two) times daily., Disp: 180 tablet, Rfl: 3 .  sildenafil (VIAGRA) 100 MG tablet, Take 1 tablet (100 mg total) by mouth daily as needed for erectile dysfunction. (Patient not taking: Reported on 05/25/2020), Disp: 10 tablet, Rfl: 3 .  spironolactone (ALDACTONE) 25 MG tablet, Take 1 tablet (25 mg total) by mouth daily., Disp: 30 tablet, Rfl: 11 .  tetrahydrozoline 0.05 % ophthalmic solution, Place 1 drop into both eyes daily., Disp: , Rfl:  .  TRUEPLUS PEN NEEDLES 31G X 6 MM MISC, USE AS DIRECTED, Disp: 100 each, Rfl: 0 No Known Allergies   Social History   Socioeconomic History  . Marital status: Legally Separated    Spouse name: Not on file  . Number of children: Not on file  . Years of education: Not on file  . Highest education level: Not on file  Occupational History  . Not on file  Tobacco Use  . 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 Smokeless tobacco: Never Used  Vaping Use  . Vaping Use: Never used  Substance and Sexual Activity  . Alcohol use: No  . Drug use: No  . Sexual activity: Yes  Other Topics Concern  . Not on file  Social History Narrative  . Not on file   Social Determinants of Health   Financial Resource Strain: Low Risk   . Difficulty of Paying Living Expenses: Not very hard  Food Insecurity: No Food Insecurity  . Worried About Charity fundraiser in the Last Year: Never true  . Ran Out of Food in the Last Year: Never true  Transportation Needs: Unmet Transportation Needs  . Lack of Transportation (Medical): Yes  . Lack of Transportation (Non-Medical): Yes  Physical Activity:   . Days of Exercise per Week:   . Minutes of Exercise per Session:   Stress:   . Feeling of Stress :   Social Connections:   . Frequency of Communication with Friends and Family:   . Frequency of Social Gatherings  with Friends and Family:   . Attends Religious Services:   . Active Member of Clubs or Organizations:   . Attends Archivist Meetings:   [name] Kitchen Marital Status:   Intimate Partner Violence:   . Fear of Current or Ex-Partner:   . Emotionally Abused:   [name] Kitchen Physically Abused:   . Sexually Abused:     Physical Exam Vitals reviewed.  Constitutional:      Appearance: He is normal weight.  HENT:     Head: Normocephalic.     Nose: Nose normal.     Mouth/Throat:     Mouth: Mucous membranes are moist.  Eyes:     Pupils: Pupils are equal, round, and reactive to light.  Cardiovascular:     Rate and Rhythm:  Normal rate. Rhythm irregular.     Pulses: Normal pulses.     Heart sounds: Normal heart sounds.  Pulmonary:     Effort: Pulmonary effort is normal.     Breath sounds: Normal breath sounds.  Abdominal:     Palpations: Abdomen is soft.  Musculoskeletal:        General: Normal range of motion.     Cervical back: Normal range of motion.     Right lower leg: Edema present.     Left lower leg: Edema present.  Skin:    General: Skin is warm and dry.     Capillary Refill: Capillary refill takes less than 2 seconds.  Neurological:     Mental Status: He is alert. Mental status is at baseline.  Psychiatric:        Mood and Affect: Mood normal.     Arrived for home visit for [facility], who complained of having some swellings in his legs. He had compression socks on and seemed a little short of breath when walking around the house preforming tasks. [name] says he ate "like crap" over the weekend and increased his fluid intake over the weekend too. I instructed [name] to watch his diet and fluid intake to ensure he keeps fluid off. He understood. Medications were reviewed and confirmed. 2 pill boxes were filled due to my absence next week. [name] was instructed how to use same, he understood. [name] was reminded of appointment with HF Pharmacy next week. Home visit complete. I will see in two  weeks.   Refills:  -Wellbutrin -Seroquel -Lasix   CBG- 217    Future Appointments  Date Time Provider [place]  06/10/2020 12:00 PM [facility] PHARMACY [facility] None  07/16/2020 10:40 AM Azzie Glatter, FNP [facility] None  07/28/2020  3:00 PM [name], MD [facility] None     ACTION: Home visit completed Next visit planned for two weeks

## 2020-06-02 MED FILL — HUMALOG MIX 75-25 KWIKPEN: (75-25) 100 | 25 days supply | Qty: 15 | Fill #4 | Status: TO

## 2020-06-02 MED FILL — BUPROPION HCL SR 100 MG TAB: 100 | 30 days supply | Qty: 60 | Fill #1

## 2020-06-02 MED FILL — FUROSEMIDE 40 MG TAB: 40 | 90 days supply | Qty: 180 | Fill #1

## 2020-06-10 ENCOUNTER — Other Ambulatory Visit (HOSPITAL_COMMUNITY): Payer: Self-pay

## 2020-06-10 ENCOUNTER — Other Ambulatory Visit: Payer: Self-pay

## 2020-06-10 ENCOUNTER — Ambulatory Visit (HOSPITAL_COMMUNITY)
Admission: RE | Admit: 2020-06-10 | Discharge: 2020-06-10 | Disposition: A | Discharge status: Home / Self Care | Payer: Medicare Other | Source: Ambulatory Visit | Attending: Cardiology | Admitting: Cardiology

## 2020-06-10 VITALS — BP 122/74 | HR 84 | Wt 340.4 lb

## 2020-06-10 DIAGNOSIS — G4736 Sleep related hypoventilation in conditions classified elsewhere: Secondary | ICD-10-CM | POA: Insufficient documentation

## 2020-06-10 DIAGNOSIS — E119 Type 2 diabetes mellitus without complications: Secondary | ICD-10-CM | POA: Insufficient documentation

## 2020-06-10 DIAGNOSIS — I480 Paroxysmal atrial fibrillation: Secondary | ICD-10-CM | POA: Diagnosis not present

## 2020-06-10 DIAGNOSIS — F209 Schizophrenia, unspecified: Secondary | ICD-10-CM | POA: Diagnosis not present

## 2020-06-10 DIAGNOSIS — Z79899 Other long term (current) drug therapy: Secondary | ICD-10-CM | POA: Diagnosis not present

## 2020-06-10 DIAGNOSIS — I5022 Chronic systolic (congestive) heart failure: Secondary | ICD-10-CM | POA: Insufficient documentation

## 2020-06-10 DIAGNOSIS — Z7901 Long term (current) use of anticoagulants: Secondary | ICD-10-CM | POA: Diagnosis not present

## 2020-06-10 DIAGNOSIS — I2510 Atherosclerotic heart disease of native coronary artery without angina pectoris: Secondary | ICD-10-CM | POA: Insufficient documentation

## 2020-06-10 LAB — BASIC METABOLIC PANEL
Anion gap: 11 (ref 5–15)
BUN: 26 mg/dL — ABNORMAL HIGH (ref 6–20)
CO2: 24 mmol/L (ref 22–32)
Calcium: 8.4 mg/dL — ABNORMAL LOW (ref 8.9–10.3)
Chloride: 103 mmol/L (ref 98–111)
Creatinine, Ser: 1.58 mg/dL — ABNORMAL HIGH (ref 0.61–1.24)
GFR calc Af Amer: 54 mL/min — ABNORMAL LOW (ref >60)
GFR calc non Af Amer: 47 mL/min — ABNORMAL LOW (ref >60)
Glucose, Bld: 331 mg/dL — ABNORMAL HIGH (ref 70–99)
Potassium: 4.2 mmol/L (ref 3.5–5.1)
Sodium: 138 mmol/L (ref 135–145)

## 2020-06-10 LAB — BRAIN NATRIURETIC PEPTIDE: B Natriuretic Peptide: 1367.9 pg/mL — ABNORMAL HIGH (ref 0.0–100.0)

## 2020-06-10 MED ORDER — FUROSEMIDE 40 MG PO TABS: ORAL_TABLET | ORAL | 11 refills | Status: DC

## 2020-06-10 MED ORDER — POTASSIUM CHLORIDE CRYS ER 20 MEQ PO TBCR
20.0000 meq | EXTENDED_RELEASE_TABLET | Freq: Every day | ORAL | 0 refills | Status: DC
Start: 2020-06-10 — End: 2020-12-28

## 2020-06-10 MED ORDER — FUROSEMIDE 10 MG/ML IJ SOLN
80.0000 mg | Freq: Once | INTRAMUSCULAR | Status: AC
  Administered 2020-06-10: 80 mg via INTRAVENOUS
  Filled 2020-06-10: qty 8

## 2020-06-10 MED ORDER — POTASSIUM CHLORIDE CRYS ER 20 MEQ PO TBCR
40.0000 meq | EXTENDED_RELEASE_TABLET | Freq: Once | ORAL | Status: AC
  Administered 2020-06-10: 40 meq via ORAL
  Filled 2020-06-10: qty 2

## 2020-06-10 MED FILL — POTASSIUM CL ER 20 MEQ TABL: 20 | 30 days supply | Qty: 30 | Fill #0

## 2020-06-10 NOTE — Patient Instructions (Addendum)
It was a pleasure seeing you today!  MEDICATIONS: -We are changing your medications today -Increase furosemide to 80 mg (2 tabs) twice daily for 3 days. Then start taking furosemide 80 mg every morning and 40 mg every night.  -Start taking potassium chloride 20 mEq daily -Call if you have questions about your medications.   NEXT APPOINTMENT: Return to clinic in 2 weeks with NP/PA Clinic. Zack will visit you tomorrow to check your weight and see if you are feeling better.  In general, to take care of your heart failure: -Limit your fluid intake to 2 Liters (half-gallon) per day.   -Limit your salt intake to ideally 2-3 grams (2000-3000 mg) per day. -Weigh yourself daily and record, and bring that "weight diary" to your next appointment.  (Weight gain of 2-3 pounds in 1 day typically means fluid weight.) -The medications for your heart are to help your heart and help you live longer.   -Please contact us before stopping any of your heart medications.  Call the clinic at [phone] with questions or to reschedule future appointments.

## 2020-06-10 NOTE — Progress Notes (Signed)
Patient had IV started Rt forearm and 80 mg IV lasix as well as Potassium 40 meq PO given.  Patient has urinated x 2 times in clinic.  IV removed. Darrick Grinder NP aware and HF Paramedics will follow with patient tomorrow.

## 2020-06-10 NOTE — Progress Notes (Signed)
[name] was seen in the clinic today to amend his pillbox. His weight was elevated and as a result was given 80 mg of lasix IV then 80 mg BID for the next three days then change to 80 mg in the morning and 40 mg in the evening. Additionally potassium was added. I will follow up tomorrow to see if his weight is trending down or if he needs another dose of IV lasix.   Jacquiline Doe, EMT 06/10/20

## 2020-06-11 ENCOUNTER — Other Ambulatory Visit (HOSPITAL_COMMUNITY): Payer: Self-pay

## 2020-06-11 ENCOUNTER — Other Ambulatory Visit: Payer: Self-pay | Admitting: Cardiology

## 2020-06-11 ENCOUNTER — Telehealth (HOSPITAL_COMMUNITY): Payer: Self-pay | Admitting: Pharmacist

## 2020-06-11 ENCOUNTER — Telehealth (HOSPITAL_COMMUNITY): Payer: Self-pay

## 2020-06-11 NOTE — Progress Notes (Signed)
Paramedicine Encounter    Patient ID: [name], male    DOB: Sep 19, 1961, 59 y.o.   MRN: [id number]   Patient Care Team: Nolene Ebbs, MD as PCP - General (Internal Medicine) [name], MD as PCP - Cardiology (Cardiology) [name], MD as PCP - Advanced Heart Failure (Cardiology)  Patient Active Problem List   Diagnosis Date Noted  . Anemia due to chronic blood loss   . Gastric AVM   . AVM (arteriovenous malformation) of small bowel, acquired   . Acute on chronic systolic (congestive) heart failure ([place]) 04/05/2020  . Anemia 04/05/2020  . PAF (paroxysmal atrial fibrillation) ([place]) 04/05/2020  . OSA on CPAP 04/05/2020  . Syncope and collapse 04/05/2020  . Type 2 diabetes mellitus with hyperglycemia, with long-term current use of insulin ([place]) 12/03/2019  . Diabetic polyneuropathy associated with type 2 diabetes mellitus ([place]) 12/03/2019  . Hyperglycemia 12/03/2019  . History of hyperglycemia 12/03/2019  . Erectile dysfunction 12/03/2019  . CHF (congestive heart failure) ([place]) 10/11/2019  . Paranoid schizophrenia, chronic condition ([place]) 01/26/2015  . Severe recurrent major depressive disorder with psychotic features ([place]) 01/26/2015  . GAD (generalized anxiety disorder) 01/26/2015  . OCD (obsessive compulsive disorder) 01/26/2015  . Panic disorder without agoraphobia 01/26/2015  . Insomnia 01/26/2015    Current Outpatient Medications:  .  acetaminophen (TYLENOL) 500 MG tablet, Take 500 mg by mouth every 6 (six) hours as needed for mild pain or moderate pain., Disp: , Rfl:  .  apixaban (ELIQUIS) 5 MG TABS tablet, Take 1 tablet (5 mg total) by mouth 2 (two) times daily., Disp: 180 tablet, Rfl: 3 .  atorvastatin (LIPITOR) 40 MG tablet, Take 1 tablet (40 mg total) by mouth daily at 6 PM., Disp: 90 tablet, Rfl: 3 .  Black Currant Seed Oil 500 MG CAPS, Take by mouth once. Takes one capsule daily in the morning., Disp: , Rfl:  .  blood glucose meter kit and  supplies KIT, Dispense based on patient and insurance preference. Use up to four times daily as directed. (FOR ICD-9 250.00, 250.01). (Patient taking differently: 1 each by Other route See admin instructions. Dispense based on patient and insurance preference. Use up to four times daily as directed. (FOR ICD-9 250.00, 250.01).), Disp: 1 each, Rfl: 0 .  buPROPion (WELLBUTRIN SR) 100 MG 12 hr tablet, Take 1 tablet (100 mg total) by mouth 2 (two) times daily., Disp: 60 tablet, Rfl: 6 .  carvedilol (COREG) 12.5 MG tablet, Take 1 tablet (12.5 mg total) by mouth 2 (two) times daily with a meal., Disp: 60 tablet, Rfl: 6 .  cyclobenzaprine (FLEXERIL) 10 MG tablet, Take 1 tablet (10 mg total) by mouth 3 (three) times daily as needed for muscle spasms., Disp: 30 tablet, Rfl: 3 .  ferrous sulfate 325 (65 FE) MG tablet, Take 1 tablet (325 mg total) by mouth daily with breakfast., Disp: 30 tablet, Rfl: 3 .  furosemide (LASIX) 40 MG tablet, Take furosemide 80 mg (2 tablets) every morning and 40 mg (1 tablet) every evening., Disp: 90 tablet, Rfl: 11 .  gabapentin (NEURONTIN) 300 MG capsule, Take 2 capsules (600 mg = total), by mouth, 2 times daily. (Patient taking differently: Take 600 mg by mouth 2 (two) times daily. ), Disp: 120 capsule, Rfl: 3 .  Insulin Lispro Prot & Lispro (HUMALOG MIX 75/25 KWIKPEN) (75-25) 100 UNIT/ML Kwikpen, Inject 30 Units into the skin 2 (two) times daily., Disp: 15 mL, Rfl: 11 .  Multiple Vitamins-Minerals (CENTRUM  SILVER 50+MEN) TABS, Take 1 tablet by mouth daily., Disp: , Rfl:  .  NON FORMULARY, Take 1 capsule by mouth daily. Takes [employer] daily, Disp: , Rfl:  .  pantoprazole (PROTONIX) 40 MG tablet, Take 1 tablet (40 mg total) by mouth 2 (two) times daily., Disp: 60 tablet, Rfl: 1 .  potassium chloride SA (KLOR-CON M20) 20 MEQ tablet, Take 1 tablet (20 mEq total) by mouth daily., Disp: 30 tablet, Rfl: 0 .  QC TUMERIC COMPLEX 500 MG CAPS, Take by mouth once. Takes one capsule daily in  the morning. , Disp: , Rfl:  .  QUEtiapine (SEROQUEL) 200 MG tablet, Take 200 mg by mouth at bedtime., Disp: , Rfl:  .  sacubitril-valsartan (ENTRESTO) 97-103 MG, Take 1 tablet by mouth 2 (two) times daily., Disp: 180 tablet, Rfl: 3 .  sildenafil (VIAGRA) 100 MG tablet, Take 1 tablet (100 mg total) by mouth daily as needed for erectile dysfunction., Disp: 10 tablet, Rfl: 3 .  spironolactone (ALDACTONE) 25 MG tablet, Take 1 tablet (25 mg total) by mouth daily., Disp: 30 tablet, Rfl: 11 .  tetrahydrozoline 0.05 % ophthalmic solution, Place 1 drop into both eyes daily., Disp: , Rfl:  .  TRUEPLUS PEN NEEDLES 31G X 6 MM MISC, USE AS DIRECTED, Disp: 100 each, Rfl: 0 No Known Allergies    Social History   Socioeconomic History  . Marital status: Legally Separated    Spouse name: Not on file  . Number of children: Not on file  . Years of education: Not on file  . Highest education level: Not on file  Occupational History  . Not on file  Tobacco Use  . 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 Smokeless tobacco: Never Used  Vaping Use  . Vaping Use: Never used  Substance and Sexual Activity  . Alcohol use: No  . Drug use: No  . Sexual activity: Yes  Other Topics Concern  . Not on file  Social History Narrative  . Not on file   Social Determinants of Health   Financial Resource Strain: Low Risk   . Difficulty of Paying Living Expenses: Not very hard  Food Insecurity: No Food Insecurity  . Worried About Charity fundraiser in the Last Year: Never true  . Ran Out of Food in the Last Year: Never true  Transportation Needs: Unmet Transportation Needs  . Lack of Transportation (Medical): Yes  . Lack of Transportation (Non-Medical): Yes  Physical Activity:   . Days of Exercise per Week:   . Minutes of Exercise per Session:   Stress:   . Feeling of Stress :   Social Connections:   . Frequency of Communication with Friends and Family:   . Frequency of Social Gatherings with Friends and  Family:   . Attends Religious Services:   . Active Member of Clubs or Organizations:   . Attends Archivist Meetings:   [name] Kitchen Marital Status:   Intimate Partner Violence:   . Fear of Current or Ex-Partner:   . Emotionally Abused:   [name] Kitchen Physically Abused:   . Sexually Abused:     Physical Exam Cardiovascular:     Rate and Rhythm: Normal rate and regular rhythm.     Pulses: Normal pulses.  Pulmonary:     Breath sounds: Wheezing present.  Musculoskeletal:        General: Normal range of motion.     Right lower leg: Edema present.     Left lower leg: Edema present.  Skin:    General: Skin is warm and dry.     Capillary Refill: Capillary refill takes less than 2 seconds.  Neurological:     Mental Status: He is alert and oriented to person, place, and time.  Psychiatric:        Mood and Affect: Mood normal.         Future Appointments  Date Time Provider [place]  06/24/2020  2:00 PM [facility] PA/NP [facility] None  07/16/2020 10:40 AM Azzie Glatter, FNP [facility] None  07/28/2020  3:00 PM [name], MD [facility] None    BP 122/65 (BP Location: Left Arm, Patient Position: Sitting, Cuff Size: Normal)   Pulse 78   Resp 16   Wt (!) 337 lb 6.4 oz (153 kg)   SpO2 92%   BMI 43.32 kg/m   Weight yesterday- 340.4 lb Last visit weight- n/a  Mr Follett was seen at home today and reported better than yesterday but not back to normal. He had been given 80 mg of IV lasix at the HF clinic yesterday but only achieved 3 lb weight loss. He remains edematous in his lower extremities and reported PND again last night. I contacted the HF clinic and was ordered to administer another 80 mg dose of IV lasix and hole his evening dose of oral lasix. Medication was administered via 22 gauge IV in the right hand. IV was removed and bandaged with 2 x 2 and tape. Patient began to show effects of medications within 25 minutes. I will reach out via phone later in the day to see if he has  improved. If he has not effectively diuresed at that time Dr Aundra Dubin has advised he should go to the ED for further evaluation. Follow up labs were scheduled for Monday at 12:45.   Jacquiline Doe, EMT 06/11/20  ACTION: Home visit completed

## 2020-06-11 NOTE — Telephone Encounter (Signed)
This is a CHF pt 

## 2020-06-11 NOTE — Telephone Encounter (Signed)
I called Mr Biehn to see how he was feeling following today's lasix administration. He reported he was feeling better with improved breathing at rest. He stated he had urinated 14 times since lasix was administered and his weight was down 4 lbs to 333.4 lb. I advised that if his weight begins to increase, he begins having trouble breathing or otherwise has signs of worsening fluid overload he should report to the ED over the weekend. I will make Jeris Penta aware of this as she follows him in the community.   Jacquiline Doe, EMT 06/11/20

## 2020-06-11 NOTE — Telephone Encounter (Signed)
Received message from paramedicine that Mr. Machia's weight decreased from 340.4 lbs yesterday to 337.4 lbs today after receiving Furosemide 80 mg IV once in the clinic. His vital signs were: BP 122/65, HR 70, O2 94. Physical exam was significant for wheezing. Per Dr. Aundra Dubin, will give him another furosemide 80 mg IV at home today. If he does not respond, he was instructed to report to the ED.   [name], PharmD, BCPS, BCCP, CPP Heart Failure Clinic Pharmacist [phone]

## 2020-06-14 ENCOUNTER — Other Ambulatory Visit (HOSPITAL_COMMUNITY): Payer: Self-pay | Admitting: Cardiology

## 2020-06-14 ENCOUNTER — Other Ambulatory Visit (HOSPITAL_COMMUNITY): Payer: Medicare Other

## 2020-06-14 DIAGNOSIS — I5022 Chronic systolic (congestive) heart failure: Secondary | ICD-10-CM

## 2020-06-14 MED FILL — PANTOPRAZOLE SOD DR 40 MG T: 40 | 30 days supply | Qty: 60 | Fill #0

## 2020-06-15 ENCOUNTER — Emergency Department (HOSPITAL_COMMUNITY): Payer: Medicare Other

## 2020-06-15 ENCOUNTER — Inpatient Hospital Stay (HOSPITAL_COMMUNITY): Payer: Medicare Other

## 2020-06-15 ENCOUNTER — Other Ambulatory Visit (HOSPITAL_COMMUNITY): Payer: Self-pay

## 2020-06-15 ENCOUNTER — Telehealth (HOSPITAL_COMMUNITY): Payer: Self-pay

## 2020-06-15 ENCOUNTER — Other Ambulatory Visit: Payer: Self-pay

## 2020-06-15 ENCOUNTER — Inpatient Hospital Stay (HOSPITAL_COMMUNITY)
Admission: EM | Admit: 2020-06-15 | Discharge: 2020-06-21 | DRG: 291 | Disposition: A | Discharge status: Home / Self Care | Payer: Medicare Other | Attending: Internal Medicine | Admitting: Internal Medicine

## 2020-06-15 ENCOUNTER — Encounter (HOSPITAL_COMMUNITY): Payer: Self-pay

## 2020-06-15 DIAGNOSIS — D649 Anemia, unspecified: Secondary | ICD-10-CM

## 2020-06-15 DIAGNOSIS — K31811 Angiodysplasia of stomach and duodenum with bleeding: Secondary | ICD-10-CM

## 2020-06-15 DIAGNOSIS — E1165 Type 2 diabetes mellitus with hyperglycemia: Secondary | ICD-10-CM

## 2020-06-15 DIAGNOSIS — I428 Other cardiomyopathies: Secondary | ICD-10-CM | POA: Diagnosis not present

## 2020-06-15 DIAGNOSIS — E119 Type 2 diabetes mellitus without complications: Secondary | ICD-10-CM | POA: Diagnosis not present

## 2020-06-15 DIAGNOSIS — D500 Iron deficiency anemia secondary to blood loss (chronic): Secondary | ICD-10-CM | POA: Diagnosis present

## 2020-06-15 DIAGNOSIS — Z955 Presence of coronary angioplasty implant and graft: Secondary | ICD-10-CM | POA: Diagnosis not present

## 2020-06-15 DIAGNOSIS — Z20822 Contact with and (suspected) exposure to covid-19: Secondary | ICD-10-CM | POA: Diagnosis not present

## 2020-06-15 DIAGNOSIS — E1122 Type 2 diabetes mellitus with diabetic chronic kidney disease: Secondary | ICD-10-CM | POA: Diagnosis present

## 2020-06-15 DIAGNOSIS — N179 Acute kidney failure, unspecified: Secondary | ICD-10-CM | POA: Diagnosis present

## 2020-06-15 DIAGNOSIS — D62 Acute posthemorrhagic anemia: Secondary | ICD-10-CM | POA: Diagnosis present

## 2020-06-15 DIAGNOSIS — N1831 Chronic kidney disease, stage 3a: Secondary | ICD-10-CM | POA: Diagnosis present

## 2020-06-15 DIAGNOSIS — K449 Diaphragmatic hernia without obstruction or gangrene: Secondary | ICD-10-CM | POA: Diagnosis present

## 2020-06-15 DIAGNOSIS — E1142 Type 2 diabetes mellitus with diabetic polyneuropathy: Secondary | ICD-10-CM | POA: Diagnosis not present

## 2020-06-15 DIAGNOSIS — Z9989 Dependence on other enabling machines and devices: Secondary | ICD-10-CM

## 2020-06-15 DIAGNOSIS — I248 Other forms of acute ischemic heart disease: Secondary | ICD-10-CM | POA: Diagnosis not present

## 2020-06-15 DIAGNOSIS — I2510 Atherosclerotic heart disease of native coronary artery without angina pectoris: Secondary | ICD-10-CM | POA: Diagnosis not present

## 2020-06-15 DIAGNOSIS — I5021 Acute systolic (congestive) heart failure: Secondary | ICD-10-CM

## 2020-06-15 DIAGNOSIS — Z794 Long term (current) use of insulin: Secondary | ICD-10-CM | POA: Diagnosis not present

## 2020-06-15 DIAGNOSIS — F329 Major depressive disorder, single episode, unspecified: Secondary | ICD-10-CM | POA: Diagnosis present

## 2020-06-15 DIAGNOSIS — J9601 Acute respiratory failure with hypoxia: Secondary | ICD-10-CM | POA: Diagnosis not present

## 2020-06-15 DIAGNOSIS — I480 Paroxysmal atrial fibrillation: Secondary | ICD-10-CM | POA: Diagnosis not present

## 2020-06-15 DIAGNOSIS — I10 Essential (primary) hypertension: Secondary | ICD-10-CM | POA: Diagnosis not present

## 2020-06-15 DIAGNOSIS — G4733 Obstructive sleep apnea (adult) (pediatric): Secondary | ICD-10-CM | POA: Diagnosis present

## 2020-06-15 DIAGNOSIS — R0602 Shortness of breath: Secondary | ICD-10-CM | POA: Diagnosis not present

## 2020-06-15 DIAGNOSIS — N183 Chronic kidney disease, stage 3 unspecified: Secondary | ICD-10-CM

## 2020-06-15 DIAGNOSIS — N1832 Chronic kidney disease, stage 3b: Secondary | ICD-10-CM

## 2020-06-15 DIAGNOSIS — J9690 Respiratory failure, unspecified, unspecified whether with hypoxia or hypercapnia: Secondary | ICD-10-CM | POA: Diagnosis not present

## 2020-06-15 DIAGNOSIS — F411 Generalized anxiety disorder: Secondary | ICD-10-CM | POA: Diagnosis not present

## 2020-06-15 DIAGNOSIS — M47814 Spondylosis without myelopathy or radiculopathy, thoracic region: Secondary | ICD-10-CM | POA: Diagnosis not present

## 2020-06-15 DIAGNOSIS — Z87891 Personal history of nicotine dependence: Secondary | ICD-10-CM | POA: Diagnosis not present

## 2020-06-15 DIAGNOSIS — N2889 Other specified disorders of kidney and ureter: Secondary | ICD-10-CM | POA: Diagnosis not present

## 2020-06-15 DIAGNOSIS — I5043 Acute on chronic combined systolic (congestive) and diastolic (congestive) heart failure: Secondary | ICD-10-CM | POA: Diagnosis not present

## 2020-06-15 DIAGNOSIS — J811 Chronic pulmonary edema: Secondary | ICD-10-CM | POA: Diagnosis not present

## 2020-06-15 DIAGNOSIS — I130 Hypertensive heart and chronic kidney disease with heart failure and stage 1 through stage 4 chronic kidney disease, or unspecified chronic kidney disease: Principal | ICD-10-CM | POA: Diagnosis present

## 2020-06-15 DIAGNOSIS — K31819 Angiodysplasia of stomach and duodenum without bleeding: Secondary | ICD-10-CM | POA: Diagnosis not present

## 2020-06-15 DIAGNOSIS — Z6841 Body Mass Index (BMI) 40.0 and over, adult: Secondary | ICD-10-CM

## 2020-06-15 DIAGNOSIS — K5520 Angiodysplasia of colon without hemorrhage: Secondary | ICD-10-CM | POA: Diagnosis not present

## 2020-06-15 DIAGNOSIS — Z79899 Other long term (current) drug therapy: Secondary | ICD-10-CM | POA: Diagnosis not present

## 2020-06-15 DIAGNOSIS — Z7901 Long term (current) use of anticoagulants: Secondary | ICD-10-CM | POA: Diagnosis not present

## 2020-06-15 DIAGNOSIS — I509 Heart failure, unspecified: Secondary | ICD-10-CM | POA: Diagnosis not present

## 2020-06-15 DIAGNOSIS — R195 Other fecal abnormalities: Secondary | ICD-10-CM | POA: Diagnosis not present

## 2020-06-15 DIAGNOSIS — F209 Schizophrenia, unspecified: Secondary | ICD-10-CM | POA: Diagnosis present

## 2020-06-15 DIAGNOSIS — R0902 Hypoxemia: Secondary | ICD-10-CM | POA: Diagnosis not present

## 2020-06-15 DIAGNOSIS — I5023 Acute on chronic systolic (congestive) heart failure: Secondary | ICD-10-CM | POA: Diagnosis not present

## 2020-06-15 HISTORY — DX: Sleep apnea, unspecified: G47.30

## 2020-06-15 LAB — CBC WITH DIFFERENTIAL/PLATELET
Abs Immature Granulocytes: 0 10*3/uL (ref 0.00–0.07)
Basophils Absolute: 0 10*3/uL (ref 0.0–0.1)
Basophils Relative: 0 %
Eosinophils Absolute: 0 10*3/uL (ref 0.0–0.5)
Eosinophils Relative: 0 %
HCT: 20.9 % — ABNORMAL LOW (ref 39.0–52.0)
Hemoglobin: 5.4 g/dL — CL (ref 13.0–17.0)
Lymphocytes Relative: 24 %
Lymphs Abs: 1.5 10*3/uL (ref 0.7–4.0)
MCH: 21.2 pg — ABNORMAL LOW (ref 26.0–34.0)
MCHC: 25.8 g/dL — ABNORMAL LOW (ref 30.0–36.0)
MCV: 82 fL (ref 80.0–100.0)
Monocytes Absolute: 0.2 10*3/uL (ref 0.1–1.0)
Monocytes Relative: 4 %
Neutro Abs: 4.5 10*3/uL (ref 1.7–7.7)
Neutrophils Relative %: 72 %
Platelets: 231 10*3/uL (ref 150–400)
RBC: 2.55 MIL/uL — ABNORMAL LOW (ref 4.22–5.81)
RDW: 17.8 % — ABNORMAL HIGH (ref 11.5–15.5)
WBC: 6.2 10*3/uL (ref 4.0–10.5)
nRBC: 0.8 % — ABNORMAL HIGH (ref 0.0–0.2)
nRBC: 2 /100 WBC — ABNORMAL HIGH

## 2020-06-15 LAB — I-STAT VENOUS BLOOD GAS, ED
Acid-Base Excess: 8 mmol/L — ABNORMAL HIGH (ref 0.0–2.0)
Bicarbonate: 33.9 mmol/L — ABNORMAL HIGH (ref 20.0–28.0)
Calcium, Ion: 1.07 mmol/L — ABNORMAL LOW (ref 1.15–1.40)
HCT: 22 % — ABNORMAL LOW (ref 39.0–52.0)
Hemoglobin: 7.5 g/dL — ABNORMAL LOW (ref 13.0–17.0)
O2 Saturation: 87 %
Potassium: 4.2 mmol/L (ref 3.5–5.1)
Sodium: 142 mmol/L (ref 135–145)
TCO2: 36 mmol/L — ABNORMAL HIGH (ref 22–32)
pCO2, Ven: 57.5 mmHg (ref 44.0–60.0)
pH, Ven: 7.378 (ref 7.250–7.430)
pO2, Ven: 56 mmHg — ABNORMAL HIGH (ref 32.0–45.0)

## 2020-06-15 LAB — FERRITIN: Ferritin: 13 ng/mL — ABNORMAL LOW (ref 24–336)

## 2020-06-15 LAB — RAPID URINE DRUG SCREEN, HOSP PERFORMED
Amphetamines: NOT DETECTED
Barbiturates: NOT DETECTED
Benzodiazepines: NOT DETECTED
Cocaine: NOT DETECTED
Opiates: NOT DETECTED
Tetrahydrocannabinol: NOT DETECTED

## 2020-06-15 LAB — IRON AND TIBC
Iron: 9 ug/dL — ABNORMAL LOW (ref 45–182)
Saturation Ratios: 2 % — ABNORMAL LOW (ref 17.9–39.5)
TIBC: 476 ug/dL — ABNORMAL HIGH (ref 250–450)
UIBC: 467 ug/dL

## 2020-06-15 LAB — RETICULOCYTES
Immature Retic Fract: 49.6 % — ABNORMAL HIGH (ref 2.3–15.9)
RBC.: 2.68 MIL/uL — ABNORMAL LOW (ref 4.22–5.81)
Retic Count, Absolute: 100.8 10*3/uL (ref 19.0–186.0)
Retic Ct Pct: 3.8 % — ABNORMAL HIGH (ref 0.4–3.1)

## 2020-06-15 LAB — FOLATE: Folate: 38.6 ng/mL (ref >5.9)

## 2020-06-15 LAB — BASIC METABOLIC PANEL
Anion gap: 9 (ref 5–15)
BUN: 40 mg/dL — ABNORMAL HIGH (ref 6–20)
CO2: 29 mmol/L (ref 22–32)
Calcium: 8.1 mg/dL — ABNORMAL LOW (ref 8.9–10.3)
Chloride: 101 mmol/L (ref 98–111)
Creatinine, Ser: 2 mg/dL — ABNORMAL HIGH (ref 0.61–1.24)
GFR calc Af Amer: 41 mL/min — ABNORMAL LOW (ref >60)
GFR calc non Af Amer: 35 mL/min — ABNORMAL LOW (ref >60)
Glucose, Bld: 158 mg/dL — ABNORMAL HIGH (ref 70–99)
Potassium: 4.2 mmol/L (ref 3.5–5.1)
Sodium: 139 mmol/L (ref 135–145)

## 2020-06-15 LAB — SARS CORONAVIRUS 2 BY RT PCR (HOSPITAL ORDER, PERFORMED IN ~~LOC~~ HOSPITAL LAB): SARS Coronavirus 2: NEGATIVE

## 2020-06-15 LAB — CBC
HCT: 22.2 % — ABNORMAL LOW (ref 39.0–52.0)
Hemoglobin: 5.6 g/dL — CL (ref 13.0–17.0)
MCH: 20.7 pg — ABNORMAL LOW (ref 26.0–34.0)
MCHC: 25.2 g/dL — ABNORMAL LOW (ref 30.0–36.0)
MCV: 82.2 fL (ref 80.0–100.0)
Platelets: 264 10*3/uL (ref 150–400)
RBC: 2.7 MIL/uL — ABNORMAL LOW (ref 4.22–5.81)
RDW: 17.8 % — ABNORMAL HIGH (ref 11.5–15.5)
WBC: 6.8 10*3/uL (ref 4.0–10.5)
nRBC: 0.7 % — ABNORMAL HIGH (ref 0.0–0.2)

## 2020-06-15 LAB — TROPONIN I (HIGH SENSITIVITY)
Troponin I (High Sensitivity): 55 ng/L — ABNORMAL HIGH (ref <18)
Troponin I (High Sensitivity): 73 ng/L — ABNORMAL HIGH (ref <18)

## 2020-06-15 LAB — VITAMIN B12: Vitamin B-12: 852 pg/mL (ref 180–914)

## 2020-06-15 LAB — BRAIN NATRIURETIC PEPTIDE: B Natriuretic Peptide: 1620.5 pg/mL — ABNORMAL HIGH (ref 0.0–100.0)

## 2020-06-15 LAB — CBG MONITORING, ED: Glucose-Capillary: 135 mg/dL — ABNORMAL HIGH (ref 70–99)

## 2020-06-15 LAB — PREPARE RBC (CROSSMATCH)

## 2020-06-15 LAB — POC OCCULT BLOOD, ED: Fecal Occult Bld: POSITIVE — AB

## 2020-06-15 LAB — MAGNESIUM: Magnesium: 2.1 mg/dL (ref 1.7–2.4)

## 2020-06-15 MED ORDER — ADULT MULTIVITAMIN W/MINERALS CH
1.0000 | ORAL_TABLET | Freq: Every day | ORAL | Status: DC
  Administered 2020-06-15 – 2020-06-21 (×7): 1 via ORAL
  Filled 2020-06-15 (×7): qty 1

## 2020-06-15 MED ORDER — PANTOPRAZOLE SODIUM 40 MG PO TBEC
40.0000 mg | DELAYED_RELEASE_TABLET | Freq: Two times a day (BID) | ORAL | Status: DC
  Administered 2020-06-16 – 2020-06-21 (×11): 40 mg via ORAL
  Filled 2020-06-15 (×11): qty 1

## 2020-06-15 MED ORDER — ATORVASTATIN CALCIUM 40 MG PO TABS
40.0000 mg | ORAL_TABLET | Freq: Every day | ORAL | Status: DC
  Administered 2020-06-15 – 2020-06-20 (×6): 40 mg via ORAL
  Filled 2020-06-15 (×6): qty 1

## 2020-06-15 MED ORDER — NAPHAZOLINE-GLYCERIN 0.012-0.2 % OP SOLN
1.0000 [drp] | Freq: Four times a day (QID) | OPHTHALMIC | Status: DC | PRN
  Filled 2020-06-15: qty 15

## 2020-06-15 MED ORDER — POTASSIUM CHLORIDE CRYS ER 20 MEQ PO TBCR
20.0000 meq | EXTENDED_RELEASE_TABLET | Freq: Once | ORAL | Status: AC
  Administered 2020-06-15: 20 meq via ORAL
  Filled 2020-06-15: qty 1

## 2020-06-15 MED ORDER — FUROSEMIDE 10 MG/ML IJ SOLN: 80.0000 mg | Freq: Two times a day (BID) | INTRAMUSCULAR | Status: DC

## 2020-06-15 MED ORDER — POTASSIUM CHLORIDE CRYS ER 20 MEQ PO TBCR
20.0000 meq | EXTENDED_RELEASE_TABLET | Freq: Every day | ORAL | Status: DC
  Administered 2020-06-15 – 2020-06-21 (×7): 20 meq via ORAL
  Filled 2020-06-15 (×7): qty 1

## 2020-06-15 MED ORDER — FUROSEMIDE 10 MG/ML IJ SOLN
60.0000 mg | Freq: Two times a day (BID) | INTRAMUSCULAR | Status: DC
  Administered 2020-06-16: 60 mg via INTRAVENOUS
  Filled 2020-06-15 (×2): qty 6

## 2020-06-15 MED ORDER — GABAPENTIN 300 MG PO CAPS
600.0000 mg | ORAL_CAPSULE | Freq: Two times a day (BID) | ORAL | Status: DC
  Administered 2020-06-16 – 2020-06-21 (×10): 600 mg via ORAL
  Filled 2020-06-15 (×11): qty 2

## 2020-06-15 MED ORDER — FUROSEMIDE 10 MG/ML IJ SOLN
80.0000 mg | Freq: Once | INTRAMUSCULAR | Status: AC
  Administered 2020-06-15: 80 mg via INTRAVENOUS
  Filled 2020-06-15: qty 8

## 2020-06-15 MED ORDER — ACETAMINOPHEN 500 MG PO TABS
500.0000 mg | ORAL_TABLET | Freq: Four times a day (QID) | ORAL | Status: DC | PRN
  Administered 2020-06-16 – 2020-06-20 (×7): 500 mg via ORAL
  Filled 2020-06-15 (×7): qty 1

## 2020-06-15 MED ORDER — INSULIN ASPART PROT & ASPART (70-30 MIX) 100 UNIT/ML ~~LOC~~ SUSP
24.0000 [IU] | Freq: Two times a day (BID) | SUBCUTANEOUS | Status: DC
  Administered 2020-06-15 – 2020-06-21 (×12): 24 [IU] via SUBCUTANEOUS
  Filled 2020-06-15: qty 10

## 2020-06-15 MED ORDER — QUETIAPINE FUMARATE 100 MG PO TABS
200.0000 mg | ORAL_TABLET | Freq: Every day | ORAL | Status: DC
  Administered 2020-06-16 – 2020-06-20 (×5): 200 mg via ORAL
  Filled 2020-06-15: qty 1
  Filled 2020-06-15 (×2): qty 2
  Filled 2020-06-15: qty 1
  Filled 2020-06-15 (×3): qty 2

## 2020-06-15 MED ORDER — FERROUS SULFATE 325 (65 FE) MG PO TABS
325.0000 mg | ORAL_TABLET | Freq: Every day | ORAL | Status: DC
  Administered 2020-06-16 – 2020-06-17 (×2): 325 mg via ORAL
  Filled 2020-06-15 (×2): qty 1

## 2020-06-15 MED ORDER — INSULIN LISPRO PROT & LISPRO (75-25 MIX) 100 UNIT/ML KWIKPEN: 24.0000 [IU] | PEN_INJECTOR | Freq: Two times a day (BID) | SUBCUTANEOUS | Status: DC

## 2020-06-15 MED ORDER — SODIUM CHLORIDE 0.9 % IV SOLN
10.0000 mL/h | Freq: Once | INTRAVENOUS | Status: AC
  Administered 2020-06-15: 10 mL/h via INTRAVENOUS

## 2020-06-15 NOTE — ED Notes (Signed)
Pt noted to be lethargic, arousable to voice; sats dropping to 70's when asleep; pt endorses hx of sleep apnea; pt placed on 4L O2 via [place]; sats 100%

## 2020-06-15 NOTE — H&P (Addendum)
History and Physical    DOA: 06/15/2020  PCP: Nolene Ebbs, MD  Patient coming from: Home  Chief Complaint: Fluid retention  HPI: [name] is a 59 y.o. male with history h/o hypertension, CAD, CHF with low EF 30%, DM, depression/schizophrenia who is closely followed by heart failure-paramedic team as outpatient presents with complaints of persistent leg swellings and dyspnea.  Patient was seen in heart failure clinic on July 15 and noted to have volume overload, received 80 mg IV Lasix in the clinic with potassium and was being monitored closely as outpatient.  He was directed to present to the ED today after paramedic team noted persistent decompensated state and concern for worsening leg/pulmonary edema.  Patient states he has been feeling "congested" in his chest and face over the last week.  He states he was building up fluid everywhere in his body which prompted him to come to the ED.  He uses CPAP at night for sleep apnea but not on home O2.  Denies any chest pain or productive cough. ED course: Afebrile, BP 115/66-148/98, pulse 54-67, respiratory rate 10-22, pulse ox on arrival 94% on room air but patient noted to be dropping sats to 70% at times while in the ED.  Placed on 4 L O2 and sats now at 97- 100%.  Lab work-up revealed WBC 6.2, hemoglobin 5.4 (repeat level on H&H was 7.5 but CBC again showed 5.6), hematocrit 22, sodium 139, potassium 4.2, bicarb 29, BUN 40, creatinine 2.0 (baseline appears to be around 0.9 -1.3).  BNP at 1620.  Troponin I elevated at 55.  Ionized calcium somewhat low at 1.07.  Patient received 80 mg IV Lasix in the ED and has orders for blood transfusion.   Review of Systems: As per HPI otherwise, review of systems negative.    Past Medical History:  Diagnosis Date  . Anxiety   . CAD (coronary artery disease)   . CHF (congestive heart failure) ([place]) 09/2019  . Depression   . Diabetes mellitus   . Erectile dysfunction 11/2019  . H/O right heart  catheterization 09/2019  . Hypertension   . Schizophrenia [facility])     Past Surgical History:  Procedure Laterality Date  . CORONARY STENT PLACEMENT    . ENTEROSCOPY N/A 04/08/2020   Procedure: ENTEROSCOPY;  Surgeon: Doran Stabler, MD;  Location: [facility] ENDOSCOPY;  Service: Gastroenterology;  Laterality: N/A;  . HEMOSTASIS CLIP PLACEMENT  04/08/2020   Procedure: HEMOSTASIS CLIP PLACEMENT;  Surgeon: Doran Stabler, MD;  Location: [place];  Service: Gastroenterology;;  . HEMOSTASIS CONTROL  04/08/2020   Procedure: HEMOSTASIS CONTROL;  Surgeon: Doran Stabler, MD;  Location: [place];  Service: Gastroenterology;;  . HERNIA REPAIR    . RIGHT/LEFT HEART CATH AND CORONARY ANGIOGRAPHY N/A 10/14/2019   Procedure: RIGHT/LEFT HEART CATH AND CORONARY ANGIOGRAPHY;  Surgeon: Belva Crome, MD;  Location: [place] CV LAB;  Service: Cardiovascular;  Laterality: N/A;    Social history:  reports that he has quit smoking. He has never used smokeless tobacco. He reports that he does not drink alcohol and does not use drugs.   No Known Allergies  Family History  Problem Relation Age of Onset  . Heart failure Mother   . Mental illness Sister   . Mental illness Sister       Prior to Admission medications   Medication Sig Start Date End Date Taking? Authorizing Provider  acetaminophen (TYLENOL) 500 MG tablet Take 500 mg by mouth  every 6 (six) hours as needed for mild pain or moderate pain.    [provider]  apixaban (ELIQUIS) 5 MG TABS tablet Take 1 tablet (5 mg total) by mouth 2 (two) times daily. 04/12/20   Lyda Jester M, PA-C  atorvastatin (LIPITOR) 40 MG tablet Take 1 tablet (40 mg total) by mouth daily at 6 PM. 03/15/20 04/16/21  Turner, Eber Hong, MD  Black Currant Seed Oil 500 MG CAPS Take by mouth once. Takes one capsule daily in the morning. 05/11/20   [provider]  blood glucose meter kit and supplies KIT Dispense based on patient and insurance  preference. Use up to four times daily as directed. (FOR ICD-9 250.00, 250.01). Patient taking differently: 1 each by Other route See admin instructions. Dispense based on patient and insurance preference. Use up to four times daily as directed. (FOR ICD-9 250.00, 250.01). 10/16/19   Barb Merino, MD  buPROPion (WELLBUTRIN SR) 100 MG 12 hr tablet Take 1 tablet (100 mg total) by mouth 2 (two) times daily. Patient not taking: Reported on 06/15/2020 04/16/20   Azzie Glatter, FNP  carvedilol (COREG) 12.5 MG tablet Take 1 tablet (12.5 mg total) by mouth 2 (two) times daily with a meal. 04/22/20   Lyda Jester M, PA-C  cyclobenzaprine (FLEXERIL) 10 MG tablet Take 1 tablet (10 mg total) by mouth 3 (three) times daily as needed for muscle spasms. 01/12/20   Azzie Glatter, FNP  ferrous sulfate 325 (65 FE) MG tablet Take 1 tablet (325 mg total) by mouth daily with breakfast. 04/09/20   Lyda Jester M, PA-C  furosemide (LASIX) 40 MG tablet Take furosemide 80 mg (2 tablets) every morning and 40 mg (1 tablet) every evening. 06/10/20   [name], MD  gabapentin (NEURONTIN) 300 MG capsule Take 2 capsules (600 mg = total), by mouth, 2 times daily. Patient taking differently: Take 600 mg by mouth 2 (two) times daily.  01/12/20   Azzie Glatter, FNP  Insulin Lispro Prot & Lispro (HUMALOG MIX 75/25 KWIKPEN) (75-25) 100 UNIT/ML Kwikpen Inject 30 Units into the skin 2 (two) times daily. 02/18/20   Azzie Glatter, FNP  Multiple Vitamins-Minerals (CENTRUM SILVER 50+MEN) TABS Take 1 tablet by mouth daily.    [provider]  NON FORMULARY Take 1 capsule by mouth daily. [place] daily    [provider]  pantoprazole (PROTONIX) 40 MG tablet TAKE 1 TABLET (40 MG TOTAL) BY MOUTH 2 (TWO) TIMES DAILY. 06/14/20 06/14/21  [name], MD  potassium chloride SA (KLOR-CON M20) 20 MEQ tablet Take 1 tablet (20 mEq total) by mouth daily. 06/10/20 07/10/20  [name], MD  QC  TUMERIC COMPLEX 500 MG CAPS Take by mouth once. Takes one capsule daily in the morning.  05/11/20   [provider]  QUEtiapine (SEROQUEL) 200 MG tablet Take 200 mg by mouth at bedtime.    [provider]  sacubitril-valsartan (ENTRESTO) 97-103 MG Take 1 tablet by mouth 2 (two) times daily. 05/18/20   [name], MD  sildenafil (VIAGRA) 100 MG tablet Take 1 tablet (100 mg total) by mouth daily as needed for erectile dysfunction. Patient not taking: Reported on 06/15/2020 04/16/20   Azzie Glatter, FNP  spironolactone (ALDACTONE) 25 MG tablet Take 1 tablet (25 mg total) by mouth daily. 03/15/20   [name], MD  tetrahydrozoline 0.05 % ophthalmic solution Place 1 drop into both eyes daily.    [provider]  TRUEPLUS PEN NEEDLES 31G X 6 MM MISC USE AS DIRECTED 04/14/20   Azzie Glatter, FNP    Physical Exam: Vitals:   06/15/20 1331 06/15/20 1336 06/15/20 1401 06/15/20 1428  BP: (!) 115/58  139/79 (!) 148/98  Pulse: (!) 59 (!) 57 61 61  Resp:  _0 Temp:      TempSrc:      SpO2: 100% 100% 97% 98%  Weight:      Height:        Constitutional: Somewhat dyspneic, feels better with supplemental O2-currently at 4 L Eyes: PERRL, lids and conjunctivae normal, mild periorbital edema ENMT: Mucous membranes are moist. Posterior pharynx clear of any exudate or lesions.Normal dentition.  Neck: Obese, supple, no masses, no thyromegaly Respiratory: Distant breath sounds due to obese chest wall, clear to auscultation bilaterally, no wheezing, no crackles. Normal respiratory effort. No accessory muscle use.  Cardiovascular: Regular rate and rhythm, no murmurs / rubs / gallops.  1-2+ lower extremity pitting edema. 2+ pedal pulses. No carotid bruits.  Abdomen: no tenderness, no masses palpated. No hepatosplenomegaly. Bowel sounds positive.  Musculoskeletal: no clubbing / cyanosis. No joint deformity upper and lower extremities. Good ROM, no contractures. Normal  muscle tone.  Neurologic: CN 2-12 grossly intact. Sensation intact, DTR normal. Strength 5/5 in all 4.  Psychiatric: Normal judgment and insight.  Been somnolent in the ED, currently awake and oriented x 3. Normal mood.  SKIN/catheters: no rashes, lesions, ulcers. No induration  Labs on Admission: I have personally reviewed following labs and imaging studies  CBC: Recent Labs  Lab 06/15/20 1205 06/15/20 1241 06/15/20 1438  WBC 6.2  --  6.8  NEUTROABS 4.5  --   --   HGB 5.4* 7.5* 5.6*  HCT 20.9* 22.0* 22.2*  MCV 82.0  --  82.2  PLT 231  --  332   Basic Metabolic Panel: Recent Labs  Lab 06/10/20 1153 06/15/20 1205 06/15/20 1241  NA 138 139 142  K 4.2 4.2 4.2  CL 103 101  --   CO2 24 29  --   GLUCOSE 331* 158*  --   BUN 26* 40*  --   CREATININE 1.58* 2.00*  --   CALCIUM 8.4* 8.1*  --   MG  --  2.1  --    GFR: Estimated Creatinine Clearance: 61.4 mL/min (A) (by C-G formula based on SCr of 2 mg/dL (H)). Recent Labs  Lab 06/15/20 1205 06/15/20 1438  WBC 6.2 6.8   Liver Function Tests: No results for input(s): AST, ALT, ALKPHOS, BILITOT, PROT, ALBUMIN in the last 168 hours. No results for input(s): LIPASE, AMYLASE in the last 168 hours. No results for input(s): AMMONIA in the last 168 hours. Coagulation Profile: No results for input(s): INR, PROTIME in the last 168 hours. Cardiac Enzymes: No results for input(s): CKTOTAL, CKMB, CKMBINDEX, TROPONINI in the last 168 hours. BNP (last 3 results) Recent Labs    11/03/19 1647 03/15/20 0832  PROBNP 6,177* 231*   HbA1C: No results for input(s): HGBA1C in the last 72 hours. CBG: No results for input(s): GLUCAP in the last 168 hours. Lipid Profile: No results for input(s): CHOL, HDL, LDLCALC, TRIG, CHOLHDL, LDLDIRECT in the last 72 hours. Thyroid Function Tests: No results for input(s): TSH, T4TOTAL, FREET4, T3FREE, THYROIDAB in the last 72 hours. Anemia Panel: Recent Labs    06/15/20 1438  RETICCTPCT 3.8*    Urine analysis:    Component Value Date/Time   COLORURINE AMBER  BIOCHEMICALS MAY BE AFFECTED BY COLOR (A) 06/10/2010 2207   APPEARANCEUR CLEAR 06/10/2010 2207   LABSPEC 1.039 (H) 06/10/2010 2207   PHURINE 5.5 06/10/2010 2207   GLUCOSEU NEGATIVE 06/10/2010 2207   HGBUR NEGATIVE 06/10/2010 2207   BILIRUBINUR Negative 01/12/2020 0809   KETONESUR 15 (A) 06/10/2010 2207   PROTEINUR Negative 01/12/2020 0809   PROTEINUR NEGATIVE 06/10/2010 2207   UROBILINOGEN 0.2 01/12/2020 0809   UROBILINOGEN 1.0 06/10/2010 2207   NITRITE Negative 01/12/2020 0809   NITRITE NEGATIVE 06/10/2010 2207   LEUKOCYTESUR Negative 01/12/2020 0809    Radiological Exams on Admission: Personally reviewed  DG Chest Portable 1 View  Result Date: 06/15/2020 CLINICAL DATA:  Generalized edema EXAM: PORTABLE CHEST 1 VIEW COMPARISON:  Apr 06, 2020 FINDINGS: The cardiomediastinal silhouette is unchanged and enlarged in contour. No significant pleural effusion. No pneumothorax. No acute pleuroparenchymal abnormality. Unchanged perihilar vascular prominence. Mild diffuse interstitial prominence and peribronchial cuffing. Visualized abdomen is unremarkable. Multilevel degenerative changes of the thoracic spine. IMPRESSION: Constellation of findings are favored to reflect mild pulmonary edema. Electronically Signed   By: Valentino Saxon MD   On: 06/15/2020 13:05    EKG: Independently reviewed.  Normal sinus rhythm with mild PR prolongation, T wave inversion in inferolateral leads, incomplete LBBB, QTC 463 ms     Assessment and Plan:   Principal Problem:   CHF exacerbation (HCC) Active Problems:   Anemia due to chronic blood loss   Type 2 diabetes mellitus with hyperglycemia, with long-term current use of insulin (HCC)   PAF (paroxysmal atrial fibrillation) (HCC)   OSA on CPAP   AKI (acute kidney injury) ([place])   GAD (generalized anxiety disorder)   Diabetic polyneuropathy associated with type 2 diabetes mellitus  ([place])   AVM (arteriovenous malformation) of small bowel, acquired   CKD (chronic kidney disease), stage III    1.  Acute combined systolic/diastolic CHF exacerbation: Failed outpatient management.  Patient has been taking Lasix 80 mg daily and 40 mg every afternoon.  Also received extra IV diuresis through heart failure clinic recently.  Last echo in May 2021 revealed low EF at 30% and grade 2 diastolic dysfunction.  Patient also has mildly reduced right ventricular systolic function and dilated left atrium.  Patient follows heart failure clinic, Dr. Algernon Huxley, as outpatient and is on maintenance medications-aspirin/Entresto/Coreg/Aldactone/Lasix with potassium.  Will admit with IV Lasix 60 mg twice daily and follow daily weights, I/Os, renal function closely.   2.  AKI on CKD stage IIIa: Patient's baseline creatinine appears to be around 0.9-1.3.  Could be related to congested kidney.  Will monitor response with IV diuresis.  Watch for cardiorenal component and worsening with IV diuretics. Entresto dosage was reduced in last admission-will hold for now.  Will obtain renal ultrasonogram.   3.  Acute hypoxic respiratory failure: In the setting of problem #1 versus untreated sleep apnea.  Continue supplemental O2, CPAP 4 mm hg at night and IV diuresis as mentioned above-O2 can be titrated as needed.  May benefit from home O2 evaluation-diurnal and nocturnal prior to discharge.  4.  Acute on chronic iron deficiency anemia: Patient denies any melena or hematochezia.  Rectal exam per EDP was guaiac positive but no evidence of melena.  Patient has had critical anemia in the last admission with hemoglobin dropping to as low as 4.3-he required 6 units of PRBC transfusion.  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  Patient was resumed on Eliquis and started on iron  supplements at the time of last discharge.  Has orders for 3 units of blood transfusion, will give 2 units and repeat blood count in am  given concern for volume overload.  5.  Diabetes mellitus with neuropathy/CKD: On 75/25 insulin 30 units twice daily.  Resume home medications including Neurontin.  6.  Paroxysmal atrial fibrillation: Rate controlled on beta-blockers, also on Eliquis as outpatient which we will hold for critical anemia.  Requested cardiology to weigh in risks and benefits of continuing anticoagulation.  Discussed with Dr. Marigene Ehlers  7.  Anxiety/depression: Resume home medications-Wellbutrin, hold Seroquel for sedation.  8.  Sleep apnea: On CPAP at home.  Patient very somnolent in the ED and O2 sat dropping in sleep requiring nasal cannula O2 up to 4 L.  Patient does not use O2 at home with CPAP    DVT prophylaxis: SCDs, hold anticoagulation due to anemia  COVID screen: Pending  Code Status:   Full code.  Patient/Family Communication: Discussed with patient and all questions answered to satisfaction.  Consults called: Heart failure team Admission status :I certify that at the point of admission it is my clinical judgment that the patient will require inpatient hospital care spanning beyond 2 midnights from the point of admission due to high intensity of service and high frequency of surveillance required.Inpatient status is judged to be reasonable and necessary in order to provide the required intensity of service to ensure the patient's safety. The patient's presenting symptoms, physical exam findings, and initial radiographic and laboratory data in the context of their chronic comorbidities is felt to place them at high risk for further clinical deterioration. The following factors support the patient status of inpatient : Acute CHF exacerbation with AKI failing outpatient management.  IV diuresis, blood transfusion     Guilford Shi MD [facility] Pager in  [place]  If 7PM-7AM, please contact night-coverage [email or web address]   06/15/2020, 3:25 PM

## 2020-06-15 NOTE — ED Notes (Signed)
Dinner Tray Ordered @ 1709. 

## 2020-06-15 NOTE — ED Provider Notes (Signed)
[facility] EMERGENCY DEPARTMENT Provider Note   CSN: [id number] Arrival date & time: 06/15/20  1146     History Chief Complaint  Patient presents with  . Shortness of Breath    [name] is a 59 y.o. male.  HPI     59 year old comes in a chief complaint of shortness of breath. Patient has history of CAD, CHF with EF of 30%, diabetes.  He does not have underlying lung disease.  Patient reports that his been having shortness of breath for the last few days.  He has been seen by medic at home program and was advised to come to the ER, however patient has declined until today.  He reports worsening shortness of breath with exertion and also shortness of breath at rest along with orthopnea and PND.  Patient has been taking his medications as prescribed.  Patient was noted to be hypoxic at arrival. He is now on 4 L.  He is wheezing.  He denies any new cough, fevers, chills and he is vaccinated against COVID-19  Past Medical History:  Diagnosis Date  . Anxiety   . CAD (coronary artery disease)   . CHF (congestive heart failure) ([place]) 09/2019  . Depression   . Diabetes mellitus   . Erectile dysfunction 11/2019  . H/O right heart catheterization 09/2019  . Hypertension   . Schizophrenia [facility])     Patient Active Problem List   Diagnosis Date Noted  . CHF exacerbation ([place]) 06/15/2020  . Anemia due to chronic blood loss   . Gastric AVM   . AVM (arteriovenous malformation) of small bowel, acquired   . Acute on chronic systolic (congestive) heart failure ([place]) 04/05/2020  . Anemia 04/05/2020  . PAF (paroxysmal atrial fibrillation) ([place]) 04/05/2020  . OSA on CPAP 04/05/2020  . Syncope and collapse 04/05/2020  . Type 2 diabetes mellitus with hyperglycemia, with long-term current use of insulin ([place]) 12/03/2019  . Diabetic polyneuropathy associated with type 2 diabetes mellitus ([place]) 12/03/2019  . Hyperglycemia 12/03/2019  . History of hyperglycemia  12/03/2019  . Erectile dysfunction 12/03/2019  . CHF (congestive heart failure) ([place]) 10/11/2019  . Paranoid schizophrenia, chronic condition ([place]) 01/26/2015  . Severe recurrent major depressive disorder with psychotic features ([place]) 01/26/2015  . GAD (generalized anxiety disorder) 01/26/2015  . OCD (obsessive compulsive disorder) 01/26/2015  . Panic disorder without agoraphobia 01/26/2015  . Insomnia 01/26/2015    Past Surgical History:  Procedure Laterality Date  . CORONARY STENT PLACEMENT    . ENTEROSCOPY N/A 04/08/2020   Procedure: ENTEROSCOPY;  Surgeon: Doran Stabler, MD;  Location: [facility] ENDOSCOPY;  Service: Gastroenterology;  Laterality: N/A;  . HEMOSTASIS CLIP PLACEMENT  04/08/2020   Procedure: HEMOSTASIS CLIP PLACEMENT;  Surgeon: Doran Stabler, MD;  Location: [place];  Service: Gastroenterology;;  . HEMOSTASIS CONTROL  04/08/2020   Procedure: HEMOSTASIS CONTROL;  Surgeon: Doran Stabler, MD;  Location: [place];  Service: Gastroenterology;;  . HERNIA REPAIR    . RIGHT/LEFT HEART CATH AND CORONARY ANGIOGRAPHY N/A 10/14/2019   Procedure: RIGHT/LEFT HEART CATH AND CORONARY ANGIOGRAPHY;  Surgeon: Belva Crome, MD;  Location: [place] CV LAB;  Service: Cardiovascular;  Laterality: N/A;       Family History  Problem Relation Age of Onset  . Heart failure Mother   . Mental illness Sister   . Mental illness Sister     Social History   Tobacco Use  . 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  Smokeless tobacco: Never Used  Vaping Use  . Vaping Use: Never used  Substance Use Topics  . Alcohol use: No  . Drug use: No    Home Medications Prior to Admission medications   Medication Sig Start Date End Date Taking? Authorizing Provider  acetaminophen (TYLENOL) 500 MG tablet Take 500 mg by mouth every 6 (six) hours as needed for mild pain or moderate pain.    [provider]  apixaban (ELIQUIS) 5 MG TABS tablet Take 1 tablet (5 mg total) by mouth 2  (two) times daily. 04/12/20   Lyda Jester M, PA-C  atorvastatin (LIPITOR) 40 MG tablet Take 1 tablet (40 mg total) by mouth daily at 6 PM. 03/15/20 04/16/21  Turner, Eber Hong, MD  Black Currant Seed Oil 500 MG CAPS Take by mouth once. Takes one capsule daily in the morning. 05/11/20   [provider]  blood glucose meter kit and supplies KIT Dispense based on patient and insurance preference. Use up to four times daily as directed. (FOR ICD-9 250.00, 250.01). Patient taking differently: 1 each by Other route See admin instructions. Dispense based on patient and insurance preference. Use up to four times daily as directed. (FOR ICD-9 250.00, 250.01). 10/16/19   Barb Merino, MD  buPROPion (WELLBUTRIN SR) 100 MG 12 hr tablet Take 1 tablet (100 mg total) by mouth 2 (two) times daily. Patient not taking: Reported on 06/15/2020 04/16/20   Azzie Glatter, FNP  carvedilol (COREG) 12.5 MG tablet Take 1 tablet (12.5 mg total) by mouth 2 (two) times daily with a meal. 04/22/20   Lyda Jester M, PA-C  cyclobenzaprine (FLEXERIL) 10 MG tablet Take 1 tablet (10 mg total) by mouth 3 (three) times daily as needed for muscle spasms. 01/12/20   Azzie Glatter, FNP  ferrous sulfate 325 (65 FE) MG tablet Take 1 tablet (325 mg total) by mouth daily with breakfast. 04/09/20   Lyda Jester M, PA-C  furosemide (LASIX) 40 MG tablet Take furosemide 80 mg (2 tablets) every morning and 40 mg (1 tablet) every evening. 06/10/20   [name], MD  gabapentin (NEURONTIN) 300 MG capsule Take 2 capsules (600 mg = total), by mouth, 2 times daily. Patient taking differently: Take 600 mg by mouth 2 (two) times daily.  01/12/20   Azzie Glatter, FNP  Insulin Lispro Prot & Lispro (HUMALOG MIX 75/25 KWIKPEN) (75-25) 100 UNIT/ML Kwikpen Inject 30 Units into the skin 2 (two) times daily. 02/18/20   Azzie Glatter, FNP  Multiple Vitamins-Minerals (CENTRUM SILVER 50+MEN) TABS Take 1 tablet by mouth daily.     [provider]  NON FORMULARY Take 1 capsule by mouth daily. [place] daily    [provider]  pantoprazole (PROTONIX) 40 MG tablet TAKE 1 TABLET (40 MG TOTAL) BY MOUTH 2 (TWO) TIMES DAILY. 06/14/20 06/14/21  [name], MD  potassium chloride SA (KLOR-CON M20) 20 MEQ tablet Take 1 tablet (20 mEq total) by mouth daily. 06/10/20 07/10/20  [name], MD  QC TUMERIC COMPLEX 500 MG CAPS Take by mouth once. Takes one capsule daily in the morning.  05/11/20   [provider]  QUEtiapine (SEROQUEL) 200 MG tablet Take 200 mg by mouth at bedtime.    [provider]  sacubitril-valsartan (ENTRESTO) 97-103 MG Take 1 tablet by mouth 2 (two) times daily. 05/18/20   [name], MD  sildenafil (VIAGRA) 100 MG tablet Take 1 tablet (100 mg total) by mouth daily  as needed for erectile dysfunction. Patient not taking: Reported on 06/15/2020 04/16/20   Azzie Glatter, FNP  spironolactone (ALDACTONE) 25 MG tablet Take 1 tablet (25 mg total) by mouth daily. 03/15/20   [name], MD  tetrahydrozoline 0.05 % ophthalmic solution Place 1 drop into both eyes daily.    [provider]  TRUEPLUS PEN NEEDLES 31G X 6 MM MISC USE AS DIRECTED 04/14/20   Azzie Glatter, FNP    Allergies    Patient has no known allergies.  Review of Systems   Review of Systems  Constitutional: Positive for activity change.  Respiratory: Positive for shortness of breath and wheezing.   Cardiovascular: Positive for chest pain.  Gastrointestinal: Negative for nausea and vomiting.  Neurological: Negative for dizziness.  All other systems reviewed and are negative.   Physical Exam Updated Vital Signs BP (!) 148/98   Pulse 61   Temp 98.3 F (36.8 C) (Oral)   Resp 18   Ht '6\' 2"'  (1.88 m)   Wt (!) 153 kg   SpO2 98%   BMI 43.31 kg/m   Physical Exam Vitals and nursing note reviewed.  Constitutional:      Appearance: He is well-developed.  HENT:     Head:  Atraumatic.  Cardiovascular:     Rate and Rhythm: Normal rate.  Pulmonary:     Effort: Pulmonary effort is normal.     Breath sounds: Decreased breath sounds and wheezing present.  Musculoskeletal:     Cervical back: Neck supple.     Right lower leg: Edema present.     Left lower leg: Edema present.  Skin:    General: Skin is warm.  Neurological:     Mental Status: He is alert and oriented to person, place, and time.     ED Results / Procedures / Treatments   Labs (all labs ordered are listed, but only abnormal results are displayed) Labs Reviewed  BASIC METABOLIC PANEL - Abnormal; Notable for the following components:      Result Value   Glucose, Bld 158 (*)    BUN 40 (*)    Creatinine, Ser 2.00 (*)    Calcium 8.1 (*)    GFR calc non Af Amer 35 (*)    GFR calc Af Amer 41 (*)    All other components within normal limits  BRAIN NATRIURETIC PEPTIDE - Abnormal; Notable for the following components:   B Natriuretic Peptide 1,620.5 (*)    All other components within normal limits  CBC WITH DIFFERENTIAL/PLATELET - Abnormal; Notable for the following components:   RBC 2.55 (*)    Hemoglobin 5.4 (*)    HCT 20.9 (*)    MCH 21.2 (*)    MCHC 25.8 (*)    RDW 17.8 (*)    nRBC 0.8 (*)    nRBC 2 (*)    All other components within normal limits  CBC - Abnormal; Notable for the following components:   RBC 2.70 (*)    Hemoglobin 5.6 (*)    HCT 22.2 (*)    MCH 20.7 (*)    MCHC 25.2 (*)    RDW 17.8 (*)    nRBC 0.7 (*)    All other components within normal limits  I-STAT VENOUS BLOOD GAS, ED - Abnormal; Notable for the following components:   pO2, Ven 56.0 (*)    Bicarbonate 33.9 (*)    TCO2 36 (*)    Acid-Base Excess 8.0 (*)    Calcium, Ion  1.07 (*)    HCT 22.0 (*)    Hemoglobin 7.5 (*)    All other components within normal limits  POC OCCULT BLOOD, ED - Abnormal; Notable for the following components:   Fecal Occult Bld POSITIVE (*)    All other components within normal  limits  TROPONIN I (HIGH SENSITIVITY) - Abnormal; Notable for the following components:   Troponin I (High Sensitivity) 55 (*)    All other components within normal limits  SARS CORONAVIRUS 2 BY RT PCR (HOSPITAL ORDER, [facility])  MAGNESIUM  RAPID URINE DRUG SCREEN, HOSP PERFORMED  VITAMIN B12  IRON AND TIBC  FERRITIN  RETICULOCYTES  FOLATE  TYPE AND SCREEN  PREPARE RBC (CROSSMATCH)  TROPONIN I (HIGH SENSITIVITY)    EKG EKG Interpretation  Date/Time:  Tuesday June 15 2020 11:50:04 EDT Ventricular Rate:  63 PR Interval:    QRS Duration: 107 QT Interval:  452 QTC Calculation: 463 R Axis:   75 Text Interpretation: Sinus rhythm Borderline prolonged PR interval Incomplete left bundle branch block No acute changes New TWI in the inferior and lateral leads Confirmed by Varney Biles [phone]) on 06/15/2020 12:40:17 PM   Radiology DG Chest Portable 1 View  Result Date: 06/15/2020 CLINICAL DATA:  Generalized edema EXAM: PORTABLE CHEST 1 VIEW COMPARISON:  Apr 06, 2020 FINDINGS: The cardiomediastinal silhouette is unchanged and enlarged in contour. No significant pleural effusion. No pneumothorax. No acute pleuroparenchymal abnormality. Unchanged perihilar vascular prominence. Mild diffuse interstitial prominence and peribronchial cuffing. Visualized abdomen is unremarkable. Multilevel degenerative changes of the thoracic spine. IMPRESSION: Constellation of findings are favored to reflect mild pulmonary edema. Electronically Signed   By: Valentino Saxon MD   On: 06/15/2020 13:05    Procedures .Critical Care Performed by: Varney Biles, MD Authorized by: Varney Biles, MD   Critical care provider statement:    Critical care time (minutes):  35   Critical care was necessary to treat or prevent imminent or life-threatening deterioration of the following conditions:  Cardiac failure   Critical care was time spent personally by me on the following  activities:  Discussions with consultants, evaluation of patient's response to treatment, examination of patient, ordering and performing treatments and interventions, ordering and review of laboratory studies, ordering and review of radiographic studies, pulse oximetry, re-evaluation of patient's condition, obtaining history from patient or surrogate and review of old charts   (including critical care time)  Medications Ordered in ED Medications  0.9 %  sodium chloride infusion (has no administration in time range)  potassium chloride SA (KLOR-CON) CR tablet 20 mEq (20 mEq Oral Given 06/15/20 1232)  furosemide (LASIX) injection 80 mg (80 mg Intravenous Given 06/15/20 1443)    ED Course  I have reviewed the triage vital signs and the nursing notes.  Pertinent labs & imaging results that were available during my care of the patient were reviewed by me and considered in my medical decision making (see chart for details).  Clinical Course as of Jun 15 1502  Tue Jun 15, 2020  1502 Hemoccult positive but no melena. Patient denies any known history of bleeding. We will send anemia panel. We will transfuse him. 80 mg of Lasix given prior to transfusion.  Hemoglobin(!!): 5.6 [AN]  1503 Discussed case with admitting staff. They would like [place] to transfuse only 2 units of PRBCs for now. I called blood bank and they will only send 2 units.   [AN]    Clinical Course User Index [  AN] Varney Biles, MD   MDM Rules/Calculators/A&P                          59 year old comes in a chief complaint of shortness of breath.  He is having diffuse wheezing and also noted to have positive JVD at pitting edema.  It is clear that patient is having likely pulmonary edema secondary to his CHF decompensation.  He is requiring oxygen.  Patient is not in any respiratory distress at this time.  Post Covid test we might do a trial of BiPAP, to help with work of breathing.  Blood pressure is stable.  Final Clinical  Impression(s) / ED Diagnoses Final diagnoses:  Acute systolic congestive heart failure ([place])  Acute respiratory failure with hypoxia ([facility])  Acute anemia    Rx / DC Orders ED Discharge Orders    None       Varney Biles, MD 06/15/20 1503

## 2020-06-15 NOTE — ED Notes (Signed)
Estill Cotta sister [phone] would like an update on the pt

## 2020-06-15 NOTE — ED Triage Notes (Addendum)
Pt from home via ems seen at clinic for CHF last week was told he needs at stay at hospital in order to get fluid off; pt refused, was given lasix at clinic; community medic came out last week saw pt,  again recommended hospitalization, pt refused, lasix given again; today another medica came out and talked to pt, pt agreed to come to hospital; generalized edema; wheezing noted; c/o some sob; sats 94% RA, RR 18-20, HR 60 afib; has not taken meds today other than lasix (40 mg IV) given PTA; pt also c/o cp 8/10

## 2020-06-15 NOTE — Progress Notes (Signed)
Paramedicine Encounter    Patient ID: [name], male    DOB: 08-01-1961, 59 y.o.   MRN: [id number]   Patient Care Team: Nolene Ebbs, MD as PCP - General (Internal Medicine) [name], MD as PCP - Cardiology (Cardiology) [name], MD as PCP - Advanced Heart Failure (Cardiology)  Patient Active Problem List   Diagnosis Date Noted  . Anemia due to chronic blood loss   . Gastric AVM   . AVM (arteriovenous malformation) of small bowel, acquired   . Acute on chronic systolic (congestive) heart failure ([name]) 04/05/2020  . Anemia 04/05/2020  . PAF (paroxysmal atrial fibrillation) ([place]) 04/05/2020  . OSA on CPAP 04/05/2020  . Syncope and collapse 04/05/2020  . Type 2 diabetes mellitus with hyperglycemia, with long-term current use of insulin ([place]) 12/03/2019  . Diabetic polyneuropathy associated with type 2 diabetes mellitus ([place]) 12/03/2019  . Hyperglycemia 12/03/2019  . History of hyperglycemia 12/03/2019  . Erectile dysfunction 12/03/2019  . CHF (congestive heart failure) ([place]) 10/11/2019  . Paranoid schizophrenia, chronic condition ([place]) 01/26/2015  . Severe recurrent major depressive disorder with psychotic features ([place]) 01/26/2015  . GAD (generalized anxiety disorder) 01/26/2015  . OCD (obsessive compulsive disorder) 01/26/2015  . Panic disorder without agoraphobia 01/26/2015  . Insomnia 01/26/2015    Current Outpatient Medications:  .  acetaminophen (TYLENOL) 500 MG tablet, Take 500 mg by mouth every 6 (six) hours as needed for mild pain or moderate pain., Disp: , Rfl:  .  apixaban (ELIQUIS) 5 MG TABS tablet, Take 1 tablet (5 mg total) by mouth 2 (two) times daily., Disp: 180 tablet, Rfl: 3 .  atorvastatin (LIPITOR) 40 MG tablet, Take 1 tablet (40 mg total) by mouth daily at 6 PM., Disp: 90 tablet, Rfl: 3 .  Black Currant Seed Oil 500 MG CAPS, Take by mouth once. Takes one capsule daily in the morning., Disp: , Rfl:  .  blood glucose meter kit and  supplies KIT, Dispense based on patient and insurance preference. Use up to four times daily as directed. (FOR ICD-9 250.00, 250.01). (Patient taking differently: 1 each by Other route See admin instructions. Dispense based on patient and insurance preference. Use up to four times daily as directed. (FOR ICD-9 250.00, 250.01).), Disp: 1 each, Rfl: 0 .  buPROPion (WELLBUTRIN SR) 100 MG 12 hr tablet, Take 1 tablet (100 mg total) by mouth 2 (two) times daily., Disp: 60 tablet, Rfl: 6 .  carvedilol (COREG) 12.5 MG tablet, Take 1 tablet (12.5 mg total) by mouth 2 (two) times daily with a meal., Disp: 60 tablet, Rfl: 6 .  cyclobenzaprine (FLEXERIL) 10 MG tablet, Take 1 tablet (10 mg total) by mouth 3 (three) times daily as needed for muscle spasms., Disp: 30 tablet, Rfl: 3 .  ferrous sulfate 325 (65 FE) MG tablet, Take 1 tablet (325 mg total) by mouth daily with breakfast., Disp: 30 tablet, Rfl: 3 .  furosemide (LASIX) 40 MG tablet, Take furosemide 80 mg (2 tablets) every morning and 40 mg (1 tablet) every evening., Disp: 90 tablet, Rfl: 11 .  gabapentin (NEURONTIN) 300 MG capsule, Take 2 capsules (600 mg = total), by mouth, 2 times daily. (Patient taking differently: Take 600 mg by mouth 2 (two) times daily. ), Disp: 120 capsule, Rfl: 3 .  Insulin Lispro Prot & Lispro (HUMALOG MIX 75/25 KWIKPEN) (75-25) 100 UNIT/ML Kwikpen, Inject 30 Units into the skin 2 (two) times daily., Disp: 15 mL, Rfl: 11 .  Multiple Vitamins-Minerals (CENTRUM  SILVER 50+MEN) TABS, Take 1 tablet by mouth daily., Disp: , Rfl:  .  NON FORMULARY, Take 1 capsule by mouth daily. Takes [employer] daily, Disp: , Rfl:  .  pantoprazole (PROTONIX) 40 MG tablet, TAKE 1 TABLET (40 MG TOTAL) BY MOUTH 2 (TWO) TIMES DAILY., Disp: 60 tablet, Rfl: 1 .  potassium chloride SA (KLOR-CON M20) 20 MEQ tablet, Take 1 tablet (20 mEq total) by mouth daily., Disp: 30 tablet, Rfl: 0 .  QC TUMERIC COMPLEX 500 MG CAPS, Take by mouth once. Takes one capsule daily in  the morning. , Disp: , Rfl:  .  QUEtiapine (SEROQUEL) 200 MG tablet, Take 200 mg by mouth at bedtime., Disp: , Rfl:  .  sacubitril-valsartan (ENTRESTO) 97-103 MG, Take 1 tablet by mouth 2 (two) times daily., Disp: 180 tablet, Rfl: 3 .  sildenafil (VIAGRA) 100 MG tablet, Take 1 tablet (100 mg total) by mouth daily as needed for erectile dysfunction., Disp: 10 tablet, Rfl: 3 .  spironolactone (ALDACTONE) 25 MG tablet, Take 1 tablet (25 mg total) by mouth daily., Disp: 30 tablet, Rfl: 11 .  tetrahydrozoline 0.05 % ophthalmic solution, Place 1 drop into both eyes daily., Disp: , Rfl:  .  TRUEPLUS PEN NEEDLES 31G X 6 MM MISC, USE AS DIRECTED, Disp: 100 each, Rfl: 0 No Known Allergies   Social History   Socioeconomic History  . Marital status: Legally Separated    Spouse name: Not on file  . Number of children: Not on file  . Years of education: Not on file  . Highest education level: Not on file  Occupational History  . Not on file  Tobacco Use  . 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 Smokeless tobacco: Never Used  Vaping Use  . Vaping Use: Never used  Substance and Sexual Activity  . Alcohol use: No  . Drug use: No  . Sexual activity: Yes  Other Topics Concern  . Not on file  Social History Narrative  . Not on file   Social Determinants of Health   Financial Resource Strain: Low Risk   . Difficulty of Paying Living Expenses: Not very hard  Food Insecurity: No Food Insecurity  . Worried About Charity fundraiser in the Last Year: Never true  . Ran Out of Food in the Last Year: Never true  Transportation Needs: Unmet Transportation Needs  . Lack of Transportation (Medical): Yes  . Lack of Transportation (Non-Medical): Yes  Physical Activity:   . Days of Exercise per Week:   . Minutes of Exercise per Session:   Stress:   . Feeling of Stress :   Social Connections:   . Frequency of Communication with Friends and Family:   . Frequency of Social Gatherings with Friends and Family:    . Attends Religious Services:   . Active Member of Clubs or Organizations:   . Attends Archivist Meetings:   [name] Kitchen Marital Status:   Intimate Partner Violence:   . Fear of Current or Ex-Partner:   . Emotionally Abused:   [name] Kitchen Physically Abused:   . Sexually Abused:     Physical Exam Vitals reviewed.  Constitutional:      General: He is in acute distress.     Appearance: He is obese. He is ill-appearing.  HENT:     Head: Normocephalic.     Nose: Nose normal.     Mouth/Throat:     Mouth: Mucous membranes are moist.  Eyes:     Pupils: Pupils  are equal, round, and reactive to light.  Cardiovascular:     Rate and Rhythm: Normal rate and regular rhythm.     Pulses: Normal pulses.  Pulmonary:     Effort: Respiratory distress present.     Breath sounds: Wheezing and rhonchi present.  Abdominal:     General: There is distension.  Musculoskeletal:        General: Swelling present. Normal range of motion.     Cervical back: Normal range of motion.     Right lower leg: Edema present.     Left lower leg: Edema present.  Skin:    General: Skin is warm and dry.     Capillary Refill: Capillary refill takes less than 2 seconds.  Neurological:     General: No focal deficit present.     Mental Status: He is alert. Mental status is at baseline.     Motor: Weakness present.  Psychiatric:        Mood and Affect: Mood normal.     Arrived for home visit to find [facility] alert and off balance with shortness of breath while walking from his bedroom to the living room. [name] reports he feels bad and is having trouble breathing. Face noted to be swollen as well as abdomen and bilateral lower legs with +3 edema. Chris received IV lasix in clinic last week and he reports no improvements but has been hesitant to go to ER. Vitals were obtained.  I contacted clinic and reported the need for transport and prehospital IV Lasix dose.  Dr. Aundra Dubin agreed and ordered 31m IV Lasix prior to being  transported to ED. IV obtained and IV Lasix at 410mgiven at 1101. [facility]. I will follow up with patient in the home once he is discharged. Home visit complete.     Future Appointments  Date Time Provider [place]7/29/2021  2:00 PM [facility] PA/NP [facility] None  07/16/2020 10:40 AM StAzzie GlatterFNP [facility] None  07/28/2020  3:00 PM McLarey DresserMD [facility] None     ACTION: Home visit completed Next visit planned for one week

## 2020-06-15 NOTE — Telephone Encounter (Signed)
[name],paramedicine called to report that the patient continues to have increased swelling as well as his lungs were full of fluid. Per dr Aundra Dubin give patient a dose of iv lasix 40mg  and have patient transported to ER

## 2020-06-16 ENCOUNTER — Inpatient Hospital Stay (HOSPITAL_COMMUNITY): Payer: Medicare Other

## 2020-06-16 ENCOUNTER — Encounter (HOSPITAL_COMMUNITY): Payer: Self-pay | Admitting: Internal Medicine

## 2020-06-16 DIAGNOSIS — Z7901 Long term (current) use of anticoagulants: Secondary | ICD-10-CM

## 2020-06-16 DIAGNOSIS — K31819 Angiodysplasia of stomach and duodenum without bleeding: Secondary | ICD-10-CM

## 2020-06-16 DIAGNOSIS — I5023 Acute on chronic systolic (congestive) heart failure: Secondary | ICD-10-CM

## 2020-06-16 DIAGNOSIS — D62 Acute posthemorrhagic anemia: Secondary | ICD-10-CM

## 2020-06-16 DIAGNOSIS — N179 Acute kidney failure, unspecified: Secondary | ICD-10-CM | POA: Diagnosis not present

## 2020-06-16 DIAGNOSIS — D500 Iron deficiency anemia secondary to blood loss (chronic): Secondary | ICD-10-CM

## 2020-06-16 DIAGNOSIS — K5520 Angiodysplasia of colon without hemorrhage: Secondary | ICD-10-CM

## 2020-06-16 DIAGNOSIS — N1831 Chronic kidney disease, stage 3a: Secondary | ICD-10-CM

## 2020-06-16 LAB — CBC
HCT: 27.1 % — ABNORMAL LOW (ref 39.0–52.0)
HCT: 30 % — ABNORMAL LOW (ref 39.0–52.0)
Hemoglobin: 7.3 g/dL — ABNORMAL LOW (ref 13.0–17.0)
Hemoglobin: 8.4 g/dL — ABNORMAL LOW (ref 13.0–17.0)
MCH: 22.3 pg — ABNORMAL LOW (ref 26.0–34.0)
MCH: 23 pg — ABNORMAL LOW (ref 26.0–34.0)
MCHC: 26.9 g/dL — ABNORMAL LOW (ref 30.0–36.0)
MCHC: 28 g/dL — ABNORMAL LOW (ref 30.0–36.0)
MCV: 82.9 fL (ref 80.0–100.0)
MCV: 82 fL (ref 80.0–100.0)
Platelets: 250 10*3/uL (ref 150–400)
Platelets: 232 10*3/uL (ref 150–400)
RBC: 3.27 MIL/uL — ABNORMAL LOW (ref 4.22–5.81)
RBC: 3.66 MIL/uL — ABNORMAL LOW (ref 4.22–5.81)
RDW: 17.2 % — ABNORMAL HIGH (ref 11.5–15.5)
RDW: 17.2 % — ABNORMAL HIGH (ref 11.5–15.5)
WBC: 8.7 10*3/uL (ref 4.0–10.5)
WBC: 8.2 10*3/uL (ref 4.0–10.5)
nRBC: 0.5 % — ABNORMAL HIGH (ref 0.0–0.2)
nRBC: 0.4 % — ABNORMAL HIGH (ref 0.0–0.2)

## 2020-06-16 LAB — BASIC METABOLIC PANEL
Anion gap: 7 (ref 5–15)
BUN: 32 mg/dL — ABNORMAL HIGH (ref 6–20)
CO2: 33 mmol/L — ABNORMAL HIGH (ref 22–32)
Calcium: 8.6 mg/dL — ABNORMAL LOW (ref 8.9–10.3)
Chloride: 100 mmol/L (ref 98–111)
Creatinine, Ser: 1.58 mg/dL — ABNORMAL HIGH (ref 0.61–1.24)
GFR calc Af Amer: 54 mL/min — ABNORMAL LOW (ref >60)
GFR calc non Af Amer: 47 mL/min — ABNORMAL LOW (ref >60)
Glucose, Bld: 100 mg/dL — ABNORMAL HIGH (ref 70–99)
Potassium: 4.1 mmol/L (ref 3.5–5.1)
Sodium: 140 mmol/L (ref 135–145)

## 2020-06-16 LAB — GLUCOSE, CAPILLARY
Glucose-Capillary: 142 mg/dL — ABNORMAL HIGH (ref 70–99)
Glucose-Capillary: 110 mg/dL — ABNORMAL HIGH (ref 70–99)

## 2020-06-16 LAB — CBG MONITORING, ED: Glucose-Capillary: 104 mg/dL — ABNORMAL HIGH (ref 70–99)

## 2020-06-16 LAB — PREPARE RBC (CROSSMATCH)

## 2020-06-16 MED ORDER — INSULIN ASPART 100 UNIT/ML ~~LOC~~ SOLN
0.0000 [IU] | Freq: Three times a day (TID) | SUBCUTANEOUS | Status: DC
  Administered 2020-06-16 – 2020-06-17 (×2): 1 [IU] via SUBCUTANEOUS
  Administered 2020-06-17: 2 [IU] via SUBCUTANEOUS
  Administered 2020-06-17 – 2020-06-18 (×3): 1 [IU] via SUBCUTANEOUS
  Administered 2020-06-18: 2 [IU] via SUBCUTANEOUS
  Administered 2020-06-19: 1 [IU] via SUBCUTANEOUS
  Administered 2020-06-19 – 2020-06-20 (×3): 2 [IU] via SUBCUTANEOUS
  Administered 2020-06-20: 3 [IU] via SUBCUTANEOUS
  Administered 2020-06-20 – 2020-06-21 (×2): 1 [IU] via SUBCUTANEOUS

## 2020-06-16 MED ORDER — FUROSEMIDE 10 MG/ML IJ SOLN: 60.0000 mg | Freq: Once | INTRAMUSCULAR | Status: DC

## 2020-06-16 MED ORDER — FUROSEMIDE 10 MG/ML IJ SOLN: 80.0000 mg | Freq: Two times a day (BID) | INTRAMUSCULAR | Status: DC

## 2020-06-16 MED ORDER — FUROSEMIDE 10 MG/ML IJ SOLN
80.0000 mg | Freq: Once | INTRAMUSCULAR | Status: AC
  Administered 2020-06-16: 80 mg via INTRAVENOUS
  Filled 2020-06-16: qty 8

## 2020-06-16 MED ORDER — SODIUM CHLORIDE 0.9 % IV SOLN
510.0000 mg | Freq: Once | INTRAVENOUS | Status: DC
  Filled 2020-06-16: qty 17

## 2020-06-16 MED ORDER — CARVEDILOL 6.25 MG PO TABS
6.2500 mg | ORAL_TABLET | Freq: Two times a day (BID) | ORAL | Status: DC
  Administered 2020-06-16 – 2020-06-20 (×8): 6.25 mg via ORAL
  Filled 2020-06-16 (×8): qty 1

## 2020-06-16 MED ORDER — FUROSEMIDE 10 MG/ML IJ SOLN: 80.0000 mg | Freq: Once | INTRAMUSCULAR | Status: DC

## 2020-06-16 MED ORDER — FUROSEMIDE 10 MG/ML IJ SOLN
80.0000 mg | Freq: Two times a day (BID) | INTRAMUSCULAR | Status: DC
  Administered 2020-06-16 – 2020-06-18 (×5): 80 mg via INTRAVENOUS
  Filled 2020-06-16 (×5): qty 8

## 2020-06-16 MED ORDER — ALBUTEROL SULFATE (2.5 MG/3ML) 0.083% IN NEBU
2.5000 mg | INHALATION_SOLUTION | RESPIRATORY_TRACT | Status: DC | PRN
  Administered 2020-06-16 (×2): 2.5 mg via RESPIRATORY_TRACT
  Filled 2020-06-16 (×2): qty 3

## 2020-06-16 MED ORDER — FUROSEMIDE 10 MG/ML IJ SOLN: 40.0000 mg | Freq: Once | INTRAMUSCULAR | Status: DC

## 2020-06-16 MED ORDER — SPIRONOLACTONE 25 MG PO TABS
25.0000 mg | ORAL_TABLET | Freq: Every day | ORAL | Status: DC
  Administered 2020-06-16 – 2020-06-21 (×6): 25 mg via ORAL
  Filled 2020-06-16 (×6): qty 1

## 2020-06-16 MED ORDER — SODIUM CHLORIDE 0.9% IV SOLUTION: Freq: Once | INTRAVENOUS | Status: AC

## 2020-06-16 NOTE — ED Notes (Signed)
Pt vitals WDL, however Pt will open eyes after either shaking him gently or giving a sternal rub. Pt will look ok shake his head that he is ok and go back to sleep.  Attending MD paged

## 2020-06-16 NOTE — ED Notes (Signed)
Pt transferred to hospital bed for comfort.

## 2020-06-16 NOTE — Consult Note (Addendum)
[facility] Consult: 1:05 PM 06/16/2020  LOS: 1 day    Referring Provider: Dr Horris Latino  Primary Care Physician:  Nolene Ebbs, MD Primary Gastroenterologist:  Althia Forts.      Reason for Consultation:  Anemia.  FOBT +   HPI: [name] is a 59 y.o. male.  Hx systolic CHF.  CAD.  DM 2.  PAF, on chronic Eliquis since 03/2020.  S/p  Cardiac stent.  .  Schizophrenia.  Severe anemia treated at [facility] in 2017 w Hgb 5.  Apparently underwent unrevealing EGD and colonoscopy, but a tubular adenoma was removed from the transverse colon. Recurrent anemia with GI evaluation in May. 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  Started on oral iron and Eliquis resumed 3 days later. Discharged w bid Protonix.    Recently had volume overload addressed as an outpatient, 20 pound weight increase.  Was given IV Lasix in the clinic and his home dose of Lasix was increased.   Return to the ED yesterday with persistent fluid retention, lower extremity edema, dyspnea, hypoxia, sats 70% on room air.  No hypotension.  Nasal cannula initiated.  BNP 1620.  Creatinine 2.  Hb was 5.4>> 2 PRBCs >> 7.5>> 5.6 >> 7.3 >> additional 1 PRBC.  Platelets normal.  His home health nurse contacted him and advised him to come to the hospital because of his ongoing problems with volume overload. Patient's stools have been formed, dark for about a month.  He does not see blood leaching out into the commode water from the stools.  He thought it was probably the iron but he had been on iron for a while before the dark stools started.  No abdominal pain.  No nausea, vomiting, anorexia, dysphagia.  Some dizziness, mostly his big problem is difficulty breathing.     Past Medical  History:  Diagnosis Date  . Anxiety   . CAD (coronary artery disease)   . CHF (congestive heart failure) ([place]) 09/2019  . Depression   . Diabetes mellitus   . Erectile dysfunction 11/2019  . H/O right heart catheterization 09/2019  . Hypertension   . Schizophrenia [facility])     Past Surgical History:  Procedure Laterality Date  . CORONARY STENT PLACEMENT    . ENTEROSCOPY N/A 04/08/2020   Procedure: ENTEROSCOPY;  Surgeon: Doran Stabler, MD;  Location: [facility] ENDOSCOPY;  Service: Gastroenterology;  Laterality: N/A;  . HEMOSTASIS CLIP PLACEMENT  04/08/2020   Procedure: HEMOSTASIS CLIP PLACEMENT;  Surgeon: Doran Stabler, MD;  Location: [place];  Service: Gastroenterology;;  . HEMOSTASIS CONTROL  04/08/2020   Procedure: HEMOSTASIS CONTROL;  Surgeon: Doran Stabler, MD;  Location: [place];  Service: Gastroenterology;;  . HERNIA REPAIR    . RIGHT/LEFT HEART CATH AND CORONARY ANGIOGRAPHY N/A 10/14/2019   Procedure: RIGHT/LEFT HEART CATH AND CORONARY ANGIOGRAPHY;  Surgeon: Belva Crome, MD;  Location: [facility];  Service: Cardiovascular;  Laterality: N/A;    Prior to Admission medications  Medication Sig Start Date End Date Taking? Authorizing Provider  acetaminophen (TYLENOL) 500 MG tablet Take 500 mg by mouth every 6 (six) hours as needed for mild pain or moderate pain.   Yes [provider]  apixaban (ELIQUIS) 5 MG TABS tablet Take 1 tablet (5 mg total) by mouth 2 (two) times daily. 04/12/20  Yes Lyda Jester M, PA-C  atorvastatin (LIPITOR) 40 MG tablet Take 1 tablet (40 mg total) by mouth daily at 6 PM. 03/15/20 04/16/21 Yes Turner, Eber Hong, MD  carvedilol (COREG) 12.5 MG tablet Take 1 tablet (12.5 mg total) by mouth 2 (two) times daily with a meal. 04/22/20  Yes Simmons, Brittainy M, PA-C  ENTRESTO 24-26 MG Take 1 tablet by mouth 2 (two) times daily. 05/25/20  Yes [provider]  ferrous sulfate 325 (65 FE) MG tablet Take 1 tablet (325 mg  total) by mouth daily with breakfast. 04/09/20  Yes Lyda Jester M, PA-C  furosemide (LASIX) 40 MG tablet Take furosemide 80 mg (2 tablets) every morning and 40 mg (1 tablet) every evening. 06/10/20  Yes [name], MD  gabapentin (NEURONTIN) 300 MG capsule Take 2 capsules (600 mg = total), by mouth, 2 times daily. Patient taking differently: Take 600 mg by mouth 2 (two) times daily.  01/12/20  Yes Azzie Glatter, FNP  Insulin Lispro Prot & Lispro (HUMALOG MIX 75/25 KWIKPEN) (75-25) 100 UNIT/ML Kwikpen Inject 30 Units into the skin 2 (two) times daily. 02/18/20  Yes Azzie Glatter, FNP  Multiple Vitamins-Minerals (CENTRUM SILVER 50+MEN) TABS Take 1 tablet by mouth daily.   Yes [provider]  potassium chloride SA (KLOR-CON M20) 20 MEQ tablet Take 1 tablet (20 mEq total) by mouth daily. 06/10/20 07/10/20 Yes [name], MD  QUEtiapine (SEROQUEL) 200 MG tablet Take 200 mg by mouth at bedtime.   Yes [provider]  sildenafil (VIAGRA) 100 MG tablet Take 1 tablet (100 mg total) by mouth daily as needed for erectile dysfunction. 04/16/20  Yes Azzie Glatter, FNP  spironolactone (ALDACTONE) 25 MG tablet Take 1 tablet (25 mg total) by mouth daily. 03/15/20  Yes Turner, Eber Hong, MD  tetrahydrozoline 0.05 % ophthalmic solution Place 1 drop into both eyes daily.   Yes [provider]  blood glucose meter kit and supplies KIT Dispense based on patient and insurance preference. Use up to four times daily as directed. (FOR ICD-9 250.00, 250.01). Patient taking differently: 1 each by Other route See admin instructions. Dispense based on patient and insurance preference. Use up to four times daily as directed. (FOR ICD-9 250.00, 250.01). 10/16/19   Barb Merino, MD  buPROPion (WELLBUTRIN SR) 100 MG 12 hr tablet Take 1 tablet (100 mg total) by mouth 2 (two) times daily. Patient not taking: Reported on 06/15/2020 04/16/20   Azzie Glatter, FNP  cyclobenzaprine  (FLEXERIL) 10 MG tablet Take 1 tablet (10 mg total) by mouth 3 (three) times daily as needed for muscle spasms. Patient not taking: Reported on 06/16/2020 01/12/20   Azzie Glatter, FNP  pantoprazole (PROTONIX) 40 MG tablet TAKE 1 TABLET (40 MG TOTAL) BY MOUTH 2 (TWO) TIMES DAILY. 06/14/20 06/14/21  [name], MD  sacubitril-valsartan (ENTRESTO) 97-103 MG Take 1 tablet by mouth 2 (two) times daily. Patient not taking: Reported on 06/16/2020 05/18/20   [name], MD  TRUEPLUS PEN NEEDLES 31G X 6 MM MISC USE AS DIRECTED 04/14/20   Azzie Glatter, FNP    Scheduled  Meds: . atorvastatin  40 mg Oral q1800  . carvedilol  6.25 mg Oral BID WC  . ferrous sulfate  325 mg Oral Q breakfast  . furosemide  80 mg Intravenous BID  . gabapentin  600 mg Oral BID  . insulin aspart protamine- aspart  24 Units Subcutaneous BID WC  . multivitamin with minerals  1 tablet Oral Daily  . pantoprazole  40 mg Oral BID  . potassium chloride SA  20 mEq Oral Daily  . QUEtiapine  200 mg Oral QHS  . spironolactone  25 mg Oral Daily   Infusions:  PRN Meds: acetaminophen, albuterol, naphazoline-glycerin   Allergies as of 06/15/2020  . (No Known Allergies)    Family History  Problem Relation Age of Onset  . Heart failure Mother   . Mental illness Sister   . Mental illness Sister     Social History   Socioeconomic History  . Marital status: Legally Separated    Spouse name: Not on file  . Number of children: Not on file  . Years of education: Not on file  . Highest education level: Not on file  Occupational History  . Not on file  Tobacco Use  . 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 Smokeless tobacco: Never Used  Vaping Use  . Vaping Use: Never used  Substance and Sexual Activity  . Alcohol use: No  . Drug use: No  . Sexual activity: Yes  Other Topics Concern  . Not on file  Social History Narrative  . Not on file   Social Determinants of Health   Financial Resource Strain: Low Risk    . Difficulty of Paying Living Expenses: Not very hard  Food Insecurity: No Food Insecurity  . Worried About Charity fundraiser in the Last Year: Never true  . Ran Out of Food in the Last Year: Never true  Transportation Needs: Unmet Transportation Needs  . Lack of Transportation (Medical): Yes  . Lack of Transportation (Non-Medical): Yes  Physical Activity:   . Days of Exercise per Week:   . Minutes of Exercise per Session:   Stress:   . Feeling of Stress :   Social Connections:   . Frequency of Communication with Friends and Family:   . Frequency of Social Gatherings with Friends and Family:   . Attends Religious Services:   . Active Member of Clubs or Organizations:   . Attends Archivist Meetings:   [name] Kitchen Marital Status:   Intimate Partner Violence:   . Fear of Current or Ex-Partner:   . Emotionally Abused:   [name] Kitchen Physically Abused:   . Sexually Abused:     REVIEW OF SYSTEMS: Constitutional: See HPI ENT:  No nose bleeds Pulm: Some clear cough.  See HPI CV:  No palpitations, no LE edema.  GU:  No hematuria, no frequency GI: See HPI Heme: Denies unusual or excessive bleeding or bruising. Transfusions: See HPI. Neuro:  No headaches, no peripheral tingling or numbness.  Some dizziness.  No presyncope.  No seizures. Derm:  No itching, no rash or sores.  Endocrine:  No sweats or chills.  No polyuria or dysuria Immunization: Not queried Travel:  None beyond local counties in last few months.    PHYSICAL EXAM: Vital signs in last 24 hours: Vitals:   06/16/20 1223 06/16/20 1252  BP: (!) 149/86 (!) 151/93  Pulse: 66 74  Resp: 19 (!) 21  Temp: 98.6 F (37 C) 97.9 F (36.6 C)  SpO2: 94% 98%  Wt Readings from Last 3 Encounters:  06/15/20 (!) 153 kg  06/11/20 (!) 153 kg  06/10/20 (!) 154.4 kg    General: Obese, pleasant, comfortable.  Does not look acutely or chronically ill Head: No facial asymmetry or swelling.  No signs of head trauma. Eyes: No  conjunctival pallor.  No scleral icterus Ears: Not hard of hearing Nose: No congestion or discharge Mouth: Oral mucosa is moist, pink, clear.  Tongue is midline. Neck:+ JVD, no masses, no thyromegaly Lungs: Diminished throughout.  Some expiratory wheezes that seem due to his vigorous effort to exhale.  No dyspnea at rest.  Occasional cough. Heart: RRR.  No MRG.  S1, S2 present Abdomen: Obese, nontender, nondistended.  No HSM, masses, bruits, hernias.   Rectal: Deferred.  Stool FOBT positive at the lab.  Musc/Skeltl: No joint redness, swelling or gross deformities. Extremities: Minimal pretibial/pedal edema. Neurologic: Alert.  Oriented x3.  Provides good history.  No tremors, moves all 4 limbs, strength not tested but grossly without deficits or weakness Skin: No obvious rashes, sores or suspicious lesions. Nodes: No cervical adenopathy Psych: Calm, pleasant, cooperative  Intake/Output from previous day: 07/20 0701 - 07/21 0700 In: 630 [Blood:630] Out: 1400 [Urine:1400] Intake/Output this shift: Total I/O In: -  Out: 3400 [Urine:3400]  LAB RESULTS: Recent Labs    06/15/20 1205 06/15/20 1205 06/15/20 1241 06/15/20 1438 06/16/20 0954  WBC 6.2  --   --  6.8 8.7  HGB 5.4*   < > 7.5* 5.6* 7.3*  HCT 20.9*   < > 22.0* 22.2* 27.1*  PLT 231  --   --  264 250   < > = values in this interval not displayed.   BMET Lab Results  Component Value Date   NA 140 06/16/2020   NA 142 06/15/2020   NA 139 06/15/2020   K 4.1 06/16/2020   K 4.2 06/15/2020   K 4.2 06/15/2020   CL 100 06/16/2020   CL 101 06/15/2020   CL 103 06/10/2020   CO2 33 (H) 06/16/2020   CO2 29 06/15/2020   CO2 24 06/10/2020   GLUCOSE 100 (H) 06/16/2020   GLUCOSE 158 (H) 06/15/2020   GLUCOSE 331 (H) 06/10/2020   BUN 32 (H) 06/16/2020   BUN 40 (H) 06/15/2020   BUN 26 (H) 06/10/2020   CREATININE 1.58 (H) 06/16/2020   CREATININE 2.00 (H) 06/15/2020   CREATININE 1.58 (H) 06/10/2020   CALCIUM 8.6 (L) 06/16/2020     CALCIUM 8.1 (L) 06/15/2020   CALCIUM 8.4 (L) 06/10/2020   LFT No results for input(s): PROT, ALBUMIN, AST, ALT, ALKPHOS, BILITOT, BILIDIR, IBILI in the last 72 hours. PT/INR Lab Results  Component Value Date   INR 0.88 09/23/2011   Hepatitis Panel No results for input(s): HEPBSAG, HCVAB, HEPAIGM, HEPBIGM in the last 72 hours. C-Diff No components found for: CDIFF Lipase  No results found for: LIPASE  Drugs of Abuse     Component Value Date/Time   LABOPIA NONE DETECTED 06/15/2020 1414   COCAINSCRNUR NONE DETECTED 06/15/2020 1414   LABBENZ NONE DETECTED 06/15/2020 1414   AMPHETMU NONE DETECTED 06/15/2020 1414   THCU NONE DETECTED 06/15/2020 1414   LABBARB NONE DETECTED 06/15/2020 1414     RADIOLOGY STUDIES: DG Chest 1 View  Result Date: 06/16/2020 CLINICAL DATA:  Respiratory failure EXAM: CHEST  1 VIEW COMPARISON:  06/15/2020 FINDINGS: Frontal view of the chest demonstrates persistent enlargement of the cardiac silhouette. There is persistent central vascular congestion, with mild increased interstitial prominence  consistent with interstitial edema. No effusion or pneumothorax. No focal consolidation. IMPRESSION: 1. Slight progression of interstitial edema. Electronically Signed   By: Randa Ngo M.D.   On: 06/16/2020 03:37   US RENAL  Result Date: 06/15/2020 CLINICAL DATA:  Hypertension, diabetes. EXAM: RENAL / URINARY TRACT ULTRASOUND COMPLETE COMPARISON:  None. FINDINGS: Right Kidney: Renal measurements: 12.2 x 4.7 x 6.2 cm = volume: 185.9 mL . Echogenicity within normal limits. No mass or hydronephrosis visualized. Left Kidney: Renal measurements: 13.8 x 7.0 x 6.8 cm = volume: 345.9 mL. Echogenicity within normal limits. No mass or hydronephrosis visualized. Bladder: Appears normal for degree of bladder distention. Other: None. IMPRESSION: Normal renal ultrasound. Electronically Signed   By: Fidela Salisbury M.D.   On: 06/15/2020 16:35   DG Chest Portable 1  View  Result Date: 06/15/2020 CLINICAL DATA:  Generalized edema EXAM: PORTABLE CHEST 1 VIEW COMPARISON:  Apr 06, 2020 FINDINGS: The cardiomediastinal silhouette is unchanged and enlarged in contour. No significant pleural effusion. No pneumothorax. No acute pleuroparenchymal abnormality. Unchanged perihilar vascular prominence. Mild diffuse interstitial prominence and peribronchial cuffing. Visualized abdomen is unremarkable. Multilevel degenerative changes of the thoracic spine. IMPRESSION: Constellation of findings are favored to reflect mild pulmonary edema. Electronically Signed   By: Valentino Saxon MD   On: 06/15/2020 13:05     IMPRESSION:   *   FOBT positive.  Dark but not melenic stools.  *    Recurrent acute anemia.  Underwent 03/2020 enteroscopy at which point multiple AVMs seen, 2 of these were bleeding and treated with clips.  All AVMs were treated with APC.  *    Chronic low-dose aspirin and Eliquis.  Last dose of Eliquis 7/19  *     CHF.  LVEF 30% by echo in 03/2020.  Also seen mild elevation pulmonary pressures, grade 2 DD.   PLAN:     *   Enteroscopy, ? Colonoscopy?? Ok to eat now.  Dr Henrene Pastor will see pt later.     Azucena Freed  06/16/2020, 1:05 PM Phone [phone]  GI ATTENDING  History, laboratories, x-rays, prior endoscopy results reviewed.  Patient seen and examined.  Agree with comprehensive consultation note as outlined above.  Patient has multiple significant medical problems.  On chronic Eliquis therapy, last dose June 14, 2020.  Has had recurrent issues with iron deficiency anemia/anemia.  Now with recurrent symptomatic anemia.  Hemoccult positive stool.  Known upper GI AVMs as recently as 2 months ago.  Suspect ongoing intermittent bleeding secondary to the same.  Agree with plans for EGD/enteroscopy to assess for source of upper GI bleeding and treat any visualized AVMs.  The patient is HIGH RISK. The nature of the procedure, as well as the risks, benefits,  and alternatives were carefully and thoroughly reviewed with the patient. Ample time for discussion and questions allowed. The patient understood, was satisfied, and agreed to proceed. In the interim, please transfuse to desired hemoglobin level to achieve maximal clinical benefit.  The examination has been tentatively set up for tomorrow at [place]. Geri Seminole., M.D. [facility] of Gastroenterology

## 2020-06-16 NOTE — Consult Note (Addendum)
Advanced Heart Failure Team Consult Note   Primary Physician: Nolene Ebbs, MD PCP-Cardiologist:  [name], MD  [facility]: Dr. Aundra Dubin   Reason for Consultation: acute on chronic systolic heart failure  HPI:    [name] is seen today for evaluation of acute on chronic systolic HF,  at the request of Dr. Horris Latino, Internal Medicine.   59 yo obese male with history of chronic systolic CHF, CAD, type 2 diabetes, paroxysmal atrial fibrillation, on chronic anticoagulation therapy w/ Eliquis, h/o severe anemia in 2017 treated at [facility] and again at [facility] 03/2020, as well as h/o schizophrenia. Followed by Paramedicine.   He was recently directly admitted from [facility] to [facility] on 5/10/21for syncope and orthostasis and found to have severe anemia w/ hgb of 4.2.   FOBT+.Eliquis was held on admitas well as HF/ BP active meds. Started on IV Protonix.Received total of 6 units PRBCsduring admit.Hgbimproved4.2>>4.8>>5.3>>7.6>>8.5>>8.8>>8.7.Received IVLasix between transfusions for volume control.   GI consulted for w/u.EGD 5/13 found 2 bleedingangioectasias in the stomach+ 4 non-bleedingangiodysplastic lesions in the duodenum, all treated w/ APC.Clips (MR conditional) were placed. He was monitored and had no further bleeding. Hgb remained stable. He was started on FeSO4 325 mg daily. GI cleared to resume Eliquis 3 days later.   His BP improved w/ correction anemia. He had no further hypotension/ syncope or near syncope. His HF meds were resumed, but at reduced doses and he tolerated well. Of note, echo was obtained during admission and LVEF was 30% w/ global hypokinesis. RV systolic function moderately reduced.    He was recently seen in the [facility] on 7/15 for pharmD visit and was noted to be volume overloaded. Wt was up 20 lb from previous post hospital f/u, in the setting of dietary indiscretion w/ sodium. Labs were obtained in clinic. BNP was 1,367. SCr 1.58 (baseline ~1). K  4.2. He was given a dose of 80 mg IV Lasix in clinic and instructed to increase his home Lasix to 80 mg bid x 3 days, then reduce down to 80 qam/ 40 qpm, w/ plans for close clinic f/u.   Condition did not improve. He returned to the [facility] ED yesterday w/ complaints of persistent fluid retention w/ bilateral LEE and dyspnea. Found to be hypoxic w/ O2 sats dropping in the 70s on RA. Placed on 4L [place]. BNP higher at 1,620. SCr elevated at 2.0.  Found to have recurrent severe anemia w/ hgb of 5.4 but no significant hypotension. Eliquis held. Received blood transfusion x 2 units + IV Lasix. FOBT +. Reports recent melena.   Repeat CBC pending. BMP w/ improved SCr, down from 2.0>>1.6. K 4.1. Net 1.4L in UOP.   Feels ok currently. Reports that he felt dizzy yesterday but no syncope. BP has remained normo-hypertensive. No resting dyspnea currently. Has JVD elevated to ear. CXR today w/ slight progression of interstitial edema. Feels congested. COVID negative. No chest pain.   He has received a total of 140 mg of IV Lasix this morning, 80 mg dose at 0340 and a 60 mg dose at 0800.      Echo 03/2020 IMPRESSIONS    1. Left ventricular ejection fraction, by estimation, is 30%. The left  ventricle has moderate to severely decreased function. The left ventricle  demonstrates global hypokinesis. There is mild left ventricular  hypertrophy. Left ventricular diastolic  parameters are consistent with Grade II diastolic dysfunction  (pseudonormalization).  2. Right ventricular systolic function is mildly reduced. The right  ventricular  size is mildly enlarged. There is mildly elevated pulmonary  artery systolic pressure. The estimated right ventricular systolic  pressure is 62.9 mmHg.  3. Left atrial size was mildly dilated.  4. Right atrial size was mildly dilated.  5. The mitral valve is normal in structure. Trivial mitral valve  regurgitation. No evidence of mitral stenosis.  6. The aortic valve is  tricuspid. Aortic valve regurgitation is not  visualized. No aortic stenosis is present.  7. The inferior vena cava is dilated in size with <50% respiratory  variability, suggesting right atrial pressure of 15 mmHg.   Review of Systems: [y] = yes, _0  = no   . General: Weight gain [ Y]; Weight loss _1 ; Anorexia _2 ; Fatigue _3 ; Fever _4 ; Chills _5 ; Weakness _6   . Cardiac: Chest pain/pressure _7 ; Resting SOB _8 ; Exertional SOB [Y ]; Orthopnea _9 ; Pedal Edema _10 ; Palpitations _11 ; Syncope _12 ; Presyncope [ Y]; Paroxysmal nocturnal dyspnea_13   . Pulmonary: Cough _14 ; Wheezing_15 ; Hemoptysis_16 ; Sputum _17 ; Snoring _18   . GI: Vomiting_19 ; Dysphagia_20 ; Melena_21 ; Hematochezia _22 ; Heartburn_23 ; Abdominal pain _24 ; Constipation _25 ; Diarrhea _26 ; BRBPR _27   . GU: Hematuria_28 ; Dysuria _29 ; Nocturia_30   . Vascular: Pain in legs with walking _31 ; Pain in feet with lying flat _32 ; Non-healing sores _33 ; Stroke _34 ; TIA _35 ; Slurred speech _36 ;  . Neuro: Headaches_37 ; Vertigo_38 ; Seizures_39 ; Paresthesias_40 ;Blurred vision _41 ; Diplopia _42 ; Vision changes _43   . Ortho/Skin: Arthritis _44 ; Joint pain _45 ; Muscle pain _46 ; Joint swelling _47 ; Back Pain _48 ; Rash _49   . Psych: Depression_50 ; Anxiety_51   . Heme: Bleeding problems [Y ]; Clotting disorders _52 ; Anemia [Y ]  . Endocrine: Diabetes _53 ; Thyroid dysfunction_54   Home Medications Prior to Admission medications   Medication Sig Start Date End Date Taking? Authorizing Provider  acetaminophen (TYLENOL) 500 MG tablet Take 500 mg by mouth every 6 (six) hours as needed for mild pain or moderate pain.   Yes [provider]  apixaban (ELIQUIS) 5 MG TABS tablet Take 1 tablet (5 mg total) by mouth 2 (two) times daily. 04/12/20  Yes Lyda Jester M, PA-C  atorvastatin (LIPITOR) 40 MG tablet Take 1 tablet (40 mg total) by mouth daily at 6 PM. 03/15/20 04/16/21 Yes Turner, Eber Hong, MD  carvedilol (COREG) 12.5 MG tablet Take 1 tablet  (12.5 mg total) by mouth 2 (two) times daily with a meal. 04/22/20  Yes Simmons, Brittainy M, PA-C  ENTRESTO 24-26 MG Take 1 tablet by mouth 2 (two) times daily. 05/25/20  Yes [provider]  ferrous sulfate 325 (65 FE) MG tablet Take 1 tablet (325 mg total) by mouth daily with breakfast. 04/09/20  Yes Lyda Jester M, PA-C  furosemide (LASIX) 40 MG tablet Take furosemide 80 mg (2 tablets) every morning and 40 mg (1 tablet) every evening. 06/10/20  Yes [name], MD  gabapentin (NEURONTIN) 300 MG capsule Take 2 capsules (600 mg = total), by mouth, 2 times daily. Patient taking differently: Take 600 mg by mouth 2 (two) times daily.  01/12/20  Yes Azzie Glatter, FNP  Insulin Lispro Prot & Lispro (HUMALOG MIX 75/25 KWIKPEN) (75-25) 100 UNIT/ML Kwikpen Inject 30 Units into the skin 2 (two) times daily. 02/18/20  Yes  Azzie Glatter, FNP  Multiple Vitamins-Minerals (CENTRUM SILVER 50+MEN) TABS Take 1 tablet by mouth daily.   Yes [provider]  potassium chloride SA (KLOR-CON M20) 20 MEQ tablet Take 1 tablet (20 mEq total) by mouth daily. 06/10/20 07/10/20 Yes [name], MD  QUEtiapine (SEROQUEL) 200 MG tablet Take 200 mg by mouth at bedtime.   Yes [provider]  sildenafil (VIAGRA) 100 MG tablet Take 1 tablet (100 mg total) by mouth daily as needed for erectile dysfunction. 04/16/20  Yes Azzie Glatter, FNP  spironolactone (ALDACTONE) 25 MG tablet Take 1 tablet (25 mg total) by mouth daily. 03/15/20  Yes Turner, Eber Hong, MD  tetrahydrozoline 0.05 % ophthalmic solution Place 1 drop into both eyes daily.   Yes [provider]  blood glucose meter kit and supplies KIT Dispense based on patient and insurance preference. Use up to four times daily as directed. (FOR ICD-9 250.00, 250.01). Patient taking differently: 1 each by Other route See admin instructions. Dispense based on patient and insurance preference. Use up to four times daily as directed.  (FOR ICD-9 250.00, 250.01). 10/16/19   Barb Merino, MD  buPROPion (WELLBUTRIN SR) 100 MG 12 hr tablet Take 1 tablet (100 mg total) by mouth 2 (two) times daily. Patient not taking: Reported on 06/15/2020 04/16/20   Azzie Glatter, FNP  cyclobenzaprine (FLEXERIL) 10 MG tablet Take 1 tablet (10 mg total) by mouth 3 (three) times daily as needed for muscle spasms. Patient not taking: Reported on 06/16/2020 01/12/20   Azzie Glatter, FNP  pantoprazole (PROTONIX) 40 MG tablet TAKE 1 TABLET (40 MG TOTAL) BY MOUTH 2 (TWO) TIMES DAILY. 06/14/20 06/14/21  [name], MD  sacubitril-valsartan (ENTRESTO) 97-103 MG Take 1 tablet by mouth 2 (two) times daily. Patient not taking: Reported on 06/16/2020 05/18/20   [name], MD  TRUEPLUS PEN NEEDLES 31G X 6 MM MISC USE AS DIRECTED 04/14/20   Azzie Glatter, FNP    Past Medical History: Past Medical History:  Diagnosis Date  . Anxiety   . CAD (coronary artery disease)   . CHF (congestive heart failure) ([place]) 09/2019  . Depression   . Diabetes mellitus   . Erectile dysfunction 11/2019  . H/O right heart catheterization 09/2019  . Hypertension   . Schizophrenia [facility])     Past Surgical History: Past Surgical History:  Procedure Laterality Date  . CORONARY STENT PLACEMENT    . ENTEROSCOPY N/A 04/08/2020   Procedure: ENTEROSCOPY;  Surgeon: Doran Stabler, MD;  Location: [facility] ENDOSCOPY;  Service: Gastroenterology;  Laterality: N/A;  . HEMOSTASIS CLIP PLACEMENT  04/08/2020   Procedure: HEMOSTASIS CLIP PLACEMENT;  Surgeon: Doran Stabler, MD;  Location: [place];  Service: Gastroenterology;;  . HEMOSTASIS CONTROL  04/08/2020   Procedure: HEMOSTASIS CONTROL;  Surgeon: Doran Stabler, MD;  Location: [place];  Service: Gastroenterology;;  . HERNIA REPAIR    . RIGHT/LEFT HEART CATH AND CORONARY ANGIOGRAPHY N/A 10/14/2019   Procedure: RIGHT/LEFT HEART CATH AND CORONARY ANGIOGRAPHY;  Surgeon: Belva Crome, MD;  Location: [place] CV LAB;  Service: Cardiovascular;  Laterality: N/A;    Family History: Family History  Problem Relation Age of Onset  . Heart failure Mother   . Mental illness Sister   . Mental illness Sister     Social History: Social History   Socioeconomic History  . Marital status: Legally Separated    Spouse name: Not on file  .  Number of children: Not on file  . Years of education: Not on file  . Highest education level: Not on file  Occupational History  . Not on file  Tobacco Use  . 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 Smokeless tobacco: Never Used  Vaping Use  . Vaping Use: Never used  Substance and Sexual Activity  . Alcohol use: No  . Drug use: No  . Sexual activity: Yes  Other Topics Concern  . Not on file  Social History Narrative  . Not on file   Social Determinants of Health   Financial Resource Strain: Low Risk   . Difficulty of Paying Living Expenses: Not very hard  Food Insecurity: No Food Insecurity  . Worried About Charity fundraiser in the Last Year: Never true  . Ran Out of Food in the Last Year: Never true  Transportation Needs: Unmet Transportation Needs  . Lack of Transportation (Medical): Yes  . Lack of Transportation (Non-Medical): Yes  Physical Activity:   . Days of Exercise per Week:   . Minutes of Exercise per Session:   Stress:   . Feeling of Stress :   Social Connections:   . Frequency of Communication with Friends and Family:   . Frequency of Social Gatherings with Friends and Family:   . Attends Religious Services:   . Active Member of Clubs or Organizations:   . Attends Archivist Meetings:   [name] Kitchen Marital Status:     Allergies:  No Known Allergies  Objective:    Vital Signs:   Temp:  [97.6 F (36.4 C)-98.7 F (37.1 C)] 98.3 F (36.8 C) (07/21 0742) Pulse Rate:  [54-77] 64 (07/21 0742) Resp:  [9-28] 21 (07/21 0742) BP: (76-162)/(50-98) 139/78 (07/21 0742) SpO2:  [93 %-100 %] 99 % (07/21 0742) Weight:  [153 kg]  153 kg (07/20 1154)    Weight change: Filed Weights   06/15/20 1154  Weight: (!) 153 kg    Intake/Output:   Intake/Output Summary (Last 24 hours) at 06/16/2020 0925 Last data filed at 06/16/2020 4431 Gross per 24 hour  Intake 630 ml  Output 4300 ml  Net -3670 ml      Physical Exam    General:  Super [name]. Dentition in poor repair. No resp difficulty HEENT: normal Neck: supple. Thick neck, JVP elevated to ear . Carotids 2+ bilat; no bruits. No lymphadenopathy or thyromegaly appreciated. Cor: PMI nondisplaced. Regular rate & rhythm. No rubs, gallops or murmurs. Lungs: decreased BS at the bases  Abdomen: obese, soft, nontender, mildly distended. No hepatosplenomegaly. No bruits or masses. Good bowel sounds. Extremities: no cyanosis, clubbing, rash, edema Neuro: alert & orientedx3, cranial nerves grossly intact. moves all 4 extremities w/o difficulty. Affect pleasant   Telemetry   NSR 90s   EKG    SR 63 bpm w/ incomplete LBBB  Labs   Basic Metabolic Panel: Recent Labs  Lab 06/10/20 1153 06/15/20 1205 06/15/20 1241 06/16/20 0525  NA 138 139 142 140  K 4.2 4.2 4.2 4.1  CL 103 101  --  100  CO2 24 29  --  33*  GLUCOSE 331* 158*  --  100*  BUN 26* 40*  --  32*  CREATININE 1.58* 2.00*  --  1.58*  CALCIUM 8.4* 8.1*  --  8.6*  MG  --  2.1  --   --     Liver Function Tests: No results for input(s): AST, ALT, ALKPHOS, BILITOT, PROT, ALBUMIN in the last  168 hours. No results for input(s): LIPASE, AMYLASE in the last 168 hours. No results for input(s): AMMONIA in the last 168 hours.  CBC: Recent Labs  Lab 06/15/20 1205 06/15/20 1241 06/15/20 1438  WBC 6.2  --  6.8  NEUTROABS 4.5  --   --   HGB 5.4* 7.5* 5.6*  HCT 20.9* 22.0* 22.2*  MCV 82.0  --  82.2  PLT 231  --  264    Cardiac Enzymes: No results for input(s): CKTOTAL, CKMB, CKMBINDEX, TROPONINI in the last 168 hours.  BNP: BNP (last 3 results) Recent Labs    12/23/19 1113  06/10/20 1206 06/15/20 1205  BNP 1,322.6* 1,367.9* 1,620.5*    ProBNP (last 3 results) Recent Labs    11/03/19 1647 03/15/20 0832  PROBNP 6,177* 231*     CBG: Recent Labs  Lab 06/15/20 1936 06/16/20 0133  GLUCAP 135* 104*    Coagulation Studies: No results for input(s): LABPROT, INR in the last 72 hours.   Imaging   DG Chest 1 View  Result Date: 06/16/2020 CLINICAL DATA:  Respiratory failure EXAM: CHEST  1 VIEW COMPARISON:  06/15/2020 FINDINGS: Frontal view of the chest demonstrates persistent enlargement of the cardiac silhouette. There is persistent central vascular congestion, with mild increased interstitial prominence consistent with interstitial edema. No effusion or pneumothorax. No focal consolidation. IMPRESSION: 1. Slight progression of interstitial edema. Electronically Signed   By: Randa Ngo M.D.   On: 06/16/2020 03:37   US RENAL  Result Date: 06/15/2020 CLINICAL DATA:  Hypertension, diabetes. EXAM: RENAL / URINARY TRACT ULTRASOUND COMPLETE COMPARISON:  None. FINDINGS: Right Kidney: Renal measurements: 12.2 x 4.7 x 6.2 cm = volume: 185.9 mL . Echogenicity within normal limits. No mass or hydronephrosis visualized. Left Kidney: Renal measurements: 13.8 x 7.0 x 6.8 cm = volume: 345.9 mL. Echogenicity within normal limits. No mass or hydronephrosis visualized. Bladder: Appears normal for degree of bladder distention. Other: None. IMPRESSION: Normal renal ultrasound. Electronically Signed   By: Fidela Salisbury M.D.   On: 06/15/2020 16:35   DG Chest Portable 1 View  Result Date: 06/15/2020 CLINICAL DATA:  Generalized edema EXAM: PORTABLE CHEST 1 VIEW COMPARISON:  Apr 06, 2020 FINDINGS: The cardiomediastinal silhouette is unchanged and enlarged in contour. No significant pleural effusion. No pneumothorax. No acute pleuroparenchymal abnormality. Unchanged perihilar vascular prominence. Mild diffuse interstitial prominence and peribronchial cuffing. Visualized  abdomen is unremarkable. Multilevel degenerative changes of the thoracic spine. IMPRESSION: Constellation of findings are favored to reflect mild pulmonary edema. Electronically Signed   By: Valentino Saxon MD   On: 06/15/2020 13:05      Medications:     Current Medications: . atorvastatin  40 mg Oral q1800  . ferrous sulfate  325 mg Oral Q breakfast  . furosemide  60 mg Intravenous BID  . gabapentin  600 mg Oral BID  . insulin aspart protamine- aspart  24 Units Subcutaneous BID WC  . multivitamin with minerals  1 tablet Oral Daily  . pantoprazole  40 mg Oral BID  . potassium chloride SA  20 mEq Oral Daily  . QUEtiapine  200 mg Oral QHS     Infusions:    Assessment/Plan    1. Acute on Chronic Systolic Heart Failure - Echo in 11/20 with EF 25-30%. Repeat Echo 5/21 EF 30% with mildly decreased RV systolic function. Suspect mixed ischemic/nonischemic cardiomyopathy.  - he is volume overloaded. CXR w/ edema. BNP 1600. JVD elevated to ear - continue IV Lasix, increase to 80  mg BID.  - home HF regimen held on admit for AKI w/ SCr at 2.0, improved today at 1.6 (baseline ~1). BP stable. No hypotension.  - can add back Entresto and Arlyce Harman soon pending renal function   2. Anemia Due to GIB:  - recent admit 03/2020 for anemia 2/2 GIB. EGD 5/13 showed 2 bleeding angioectasias in the stomach + 4 non-bleeding angiodysplastic lesions in the duodenum, all treated w/ APC - on chronic a/c w/ Eliquis for PAF - now readmitted for recurrent anemia, Hgb 5.4 on admit, FOBT +. Suspect recurrent UGIB. Reports melena. Denies ETOH/ NSAID use - s/p transfusion x 2 units. Repeat CBC pending  - Eliquis on hold - Fe and Ferritin both low, treat w/ FeSO4 325  - continue Protonix, currently PO 40 mg bid, consider switch to IV Protonix.  - obtain GI consult. Needs repeat EGD   3. AKI - Admit SCr 2.0 (baseline ~1.0) - improving w/ diuresis, down to 1.58 today - continue to diurese  - continue to hold  Arlyce Harman and [place] for now   4. CAD:  - Last cath in 11/20 with occluded OM2 stent, 90% D1, severe diffuse disease in the RCA (no intervention).  - denies CP.  Hs troponin 55>>73, suspect demand ischemia from severe anemia and volume overload.  - no plan for ischemic w/u.  - No ASA given apixaban use/ recurrent GIBs.  - Continue atorvastatin. Good lipids in 1/21.   5. PAF:   - in NSR currently.  - Eliquis on hold for GIB - given recurrent GIBs w/ severe anemia, consider if Watchman candidate, though BMI may be prohibitive    6. DMII:  - insulin per primary team    7. OSA:  - CPAP QHS   Length of Stay: 1  Brittainy Simmons, PA-C  06/16/2020, 9:25 AM  Advanced Heart Failure Team Pager [phone] (M-F; 7a - 4p)  Please contact [facility] for night-coverage after hours (4p -7a ) and weekends on [email or web address]  Patient seen with PA, agree with the above note.   Patient reports about 1-2 weeks of black stool, similar to prior to last hospitalization.  Has also had progressive exertional dyspnea.  He was admitted yesterday with volume overload + recurrent anemia with hgb down to 5.4. CXR with pulmonary edema.   General: NAD Neck: JVP 12 cm, no thyromegaly or thyroid nodule.  Lungs: Crackles at bases, bilateral wheezing.  CV: Nondisplaced PMI.  Heart regular S1/S2, no S3/S4, no murmur.  No peripheral edema.   Abdomen: Soft, nontender, no hepatosplenomegaly, no distention.  Skin: Intact without lesions or rashes.  Neurologic: Alert and oriented x 3.  Psych: Normal affect. Extremities: No clubbing or cyanosis.  HEENT: Normal.   Recurrent anemia with suspected upper GI bleeding/melena. Recently treated bleeding AVMs in stomach and duodenum.  - Protonix bid - Transfuse hgb < 8, will give another unit now (7.3 this morning).  - Will need GI for EGD.  - Long-term, looks like it is going to be difficult to maintain him on Eliquis.  Will see if he would be candidate for Watchman.   On  exam, he is volume overloaded with pulmonary edema on CXR.  Initially, creatinine 2 and Entresto and spironolactone held. Creatinine 1.5 today.  - Continue Lasix 80 mg IV bid - Can restart spironolactone 25 mg daily.  - Can restart Coreg at 6.25 mg bid (on 12.5 mg bid at home).  - If creatinine stabilizes and BP ok, can restart Delene Loll  tomorrow.   He is in NSR, has history of PAF.  As above, consider Watchman.   Loralie Champagne 06/16/2020 11:49 AM

## 2020-06-16 NOTE — ED Notes (Signed)
HF team at bedside

## 2020-06-16 NOTE — ED Notes (Signed)
Lunch Tray Ordered @ 1023. 

## 2020-06-16 NOTE — H&P (View-Only) (Signed)
[facility] Consult: 1:05 PM 06/16/2020  LOS: 1 day    Referring Provider: Dr Horris Latino  Primary Care Physician:  Nolene Ebbs, MD Primary Gastroenterologist:  Althia Forts.      Reason for Consultation:  Anemia.  FOBT +   HPI: [name] is a 59 y.o. male.  Hx systolic CHF.  CAD.  DM 2.  PAF, on chronic Eliquis since 03/2020.  S/p  Cardiac stent.  .  Schizophrenia.  Severe anemia treated at [facility] in 2017 w Hgb 5.  Apparently underwent unrevealing EGD and colonoscopy, but a tubular adenoma was removed from the transverse colon. Recurrent anemia with GI evaluation in May. 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  Started on oral iron and Eliquis resumed 3 days later. Discharged w bid Protonix.    Recently had volume overload addressed as an outpatient, 20 pound weight increase.  Was given IV Lasix in the clinic and his home dose of Lasix was increased.   Return to the ED yesterday with persistent fluid retention, lower extremity edema, dyspnea, hypoxia, sats 70% on room air.  No hypotension.  Nasal cannula initiated.  BNP 1620.  Creatinine 2.  Hb was 5.4>> 2 PRBCs >> 7.5>> 5.6 >> 7.3 >> additional 1 PRBC.  Platelets normal.  His home health nurse contacted him and advised him to come to the hospital because of his ongoing problems with volume overload. Patient's stools have been formed, dark for about a month.  He does not see blood leaching out into the commode water from the stools.  He thought it was probably the iron but he had been on iron for a while before the dark stools started.  No abdominal pain.  No nausea, vomiting, anorexia, dysphagia.  Some dizziness, mostly his big problem is difficulty breathing.     Past Medical  History:  Diagnosis Date  . Anxiety   . CAD (coronary artery disease)   . CHF (congestive heart failure) ([place]) 09/2019  . Depression   . Diabetes mellitus   . Erectile dysfunction 11/2019  . H/O right heart catheterization 09/2019  . Hypertension   . Schizophrenia [facility])     Past Surgical History:  Procedure Laterality Date  . CORONARY STENT PLACEMENT    . ENTEROSCOPY N/A 04/08/2020   Procedure: ENTEROSCOPY;  Surgeon: Doran Stabler, MD;  Location: [facility] ENDOSCOPY;  Service: Gastroenterology;  Laterality: N/A;  . HEMOSTASIS CLIP PLACEMENT  04/08/2020   Procedure: HEMOSTASIS CLIP PLACEMENT;  Surgeon: Doran Stabler, MD;  Location: [place];  Service: Gastroenterology;;  . HEMOSTASIS CONTROL  04/08/2020   Procedure: HEMOSTASIS CONTROL;  Surgeon: Doran Stabler, MD;  Location: [place];  Service: Gastroenterology;;  . HERNIA REPAIR    . RIGHT/LEFT HEART CATH AND CORONARY ANGIOGRAPHY N/A 10/14/2019   Procedure: RIGHT/LEFT HEART CATH AND CORONARY ANGIOGRAPHY;  Surgeon: Belva Crome, MD;  Location: [place] CV LAB;  Service: Cardiovascular;  Laterality: N/A;    Prior to Admission medications  Medication Sig Start Date End Date Taking? Authorizing Provider  acetaminophen (TYLENOL) 500 MG tablet Take 500 mg by mouth every 6 (six) hours as needed for mild pain or moderate pain.   Yes [provider]  apixaban (ELIQUIS) 5 MG TABS tablet Take 1 tablet (5 mg total) by mouth 2 (two) times daily. 04/12/20  Yes Lyda Jester M, PA-C  atorvastatin (LIPITOR) 40 MG tablet Take 1 tablet (40 mg total) by mouth daily at 6 PM. 03/15/20 04/16/21 Yes Turner, Eber Hong, MD  carvedilol (COREG) 12.5 MG tablet Take 1 tablet (12.5 mg total) by mouth 2 (two) times daily with a meal. 04/22/20  Yes Simmons, Brittainy M, PA-C  ENTRESTO 24-26 MG Take 1 tablet by mouth 2 (two) times daily. 05/25/20  Yes [provider]  ferrous sulfate 325 (65 FE) MG tablet Take 1 tablet (325 mg  total) by mouth daily with breakfast. 04/09/20  Yes Lyda Jester M, PA-C  furosemide (LASIX) 40 MG tablet Take furosemide 80 mg (2 tablets) every morning and 40 mg (1 tablet) every evening. 06/10/20  Yes [name], MD  gabapentin (NEURONTIN) 300 MG capsule Take 2 capsules (600 mg = total), by mouth, 2 times daily. Patient taking differently: Take 600 mg by mouth 2 (two) times daily.  01/12/20  Yes Azzie Glatter, FNP  Insulin Lispro Prot & Lispro (HUMALOG MIX 75/25 KWIKPEN) (75-25) 100 UNIT/ML Kwikpen Inject 30 Units into the skin 2 (two) times daily. 02/18/20  Yes Azzie Glatter, FNP  Multiple Vitamins-Minerals (CENTRUM SILVER 50+MEN) TABS Take 1 tablet by mouth daily.   Yes [provider]  potassium chloride SA (KLOR-CON M20) 20 MEQ tablet Take 1 tablet (20 mEq total) by mouth daily. 06/10/20 07/10/20 Yes [name], MD  QUEtiapine (SEROQUEL) 200 MG tablet Take 200 mg by mouth at bedtime.   Yes [provider]  sildenafil (VIAGRA) 100 MG tablet Take 1 tablet (100 mg total) by mouth daily as needed for erectile dysfunction. 04/16/20  Yes Azzie Glatter, FNP  spironolactone (ALDACTONE) 25 MG tablet Take 1 tablet (25 mg total) by mouth daily. 03/15/20  Yes Turner, Eber Hong, MD  tetrahydrozoline 0.05 % ophthalmic solution Place 1 drop into both eyes daily.   Yes [provider]  blood glucose meter kit and supplies KIT Dispense based on patient and insurance preference. Use up to four times daily as directed. (FOR ICD-9 250.00, 250.01). Patient taking differently: 1 each by Other route See admin instructions. Dispense based on patient and insurance preference. Use up to four times daily as directed. (FOR ICD-9 250.00, 250.01). 10/16/19   Barb Merino, MD  buPROPion (WELLBUTRIN SR) 100 MG 12 hr tablet Take 1 tablet (100 mg total) by mouth 2 (two) times daily. Patient not taking: Reported on 06/15/2020 04/16/20   Azzie Glatter, FNP  cyclobenzaprine  (FLEXERIL) 10 MG tablet Take 1 tablet (10 mg total) by mouth 3 (three) times daily as needed for muscle spasms. Patient not taking: Reported on 06/16/2020 01/12/20   Azzie Glatter, FNP  pantoprazole (PROTONIX) 40 MG tablet TAKE 1 TABLET (40 MG TOTAL) BY MOUTH 2 (TWO) TIMES DAILY. 06/14/20 06/14/21  [name], MD  sacubitril-valsartan (ENTRESTO) 97-103 MG Take 1 tablet by mouth 2 (two) times daily. Patient not taking: Reported on 06/16/2020 05/18/20   [name], MD  TRUEPLUS PEN NEEDLES 31G X 6 MM MISC USE AS DIRECTED 04/14/20   Azzie Glatter, FNP    Scheduled  Meds: . atorvastatin  40 mg Oral q1800  . carvedilol  6.25 mg Oral BID WC  . ferrous sulfate  325 mg Oral Q breakfast  . furosemide  80 mg Intravenous BID  . gabapentin  600 mg Oral BID  . insulin aspart protamine- aspart  24 Units Subcutaneous BID WC  . multivitamin with minerals  1 tablet Oral Daily  . pantoprazole  40 mg Oral BID  . potassium chloride SA  20 mEq Oral Daily  . QUEtiapine  200 mg Oral QHS  . spironolactone  25 mg Oral Daily   Infusions:  PRN Meds: acetaminophen, albuterol, naphazoline-glycerin   Allergies as of 06/15/2020  . (No Known Allergies)    Family History  Problem Relation Age of Onset  . Heart failure Mother   . Mental illness Sister   . Mental illness Sister     Social History   Socioeconomic History  . Marital status: Legally Separated    Spouse name: Not on file  . Number of children: Not on file  . Years of education: Not on file  . Highest education level: Not on file  Occupational History  . Not on file  Tobacco Use  . 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 Smokeless tobacco: Never Used  Vaping Use  . Vaping Use: Never used  Substance and Sexual Activity  . Alcohol use: No  . Drug use: No  . Sexual activity: Yes  Other Topics Concern  . Not on file  Social History Narrative  . Not on file   Social Determinants of Health   Financial Resource Strain: Low Risk    . Difficulty of Paying Living Expenses: Not very hard  Food Insecurity: No Food Insecurity  . Worried About Charity fundraiser in the Last Year: Never true  . Ran Out of Food in the Last Year: Never true  Transportation Needs: Unmet Transportation Needs  . Lack of Transportation (Medical): Yes  . Lack of Transportation (Non-Medical): Yes  Physical Activity:   . Days of Exercise per Week:   . Minutes of Exercise per Session:   Stress:   . Feeling of Stress :   Social Connections:   . Frequency of Communication with Friends and Family:   . Frequency of Social Gatherings with Friends and Family:   . Attends Religious Services:   . Active Member of Clubs or Organizations:   . Attends Archivist Meetings:   [name] Kitchen Marital Status:   Intimate Partner Violence:   . Fear of Current or Ex-Partner:   . Emotionally Abused:   [name] Kitchen Physically Abused:   . Sexually Abused:     REVIEW OF SYSTEMS: Constitutional: See HPI ENT:  No nose bleeds Pulm: Some clear cough.  See HPI CV:  No palpitations, no LE edema.  GU:  No hematuria, no frequency GI: See HPI Heme: Denies unusual or excessive bleeding or bruising. Transfusions: See HPI. Neuro:  No headaches, no peripheral tingling or numbness.  Some dizziness.  No presyncope.  No seizures. Derm:  No itching, no rash or sores.  Endocrine:  No sweats or chills.  No polyuria or dysuria Immunization: Not queried Travel:  None beyond local counties in last few months.    PHYSICAL EXAM: Vital signs in last 24 hours: Vitals:   06/16/20 1223 06/16/20 1252  BP: (!) 149/86 (!) 151/93  Pulse: 66 74  Resp: 19 (!) 21  Temp: 98.6 F (37 C) 97.9 F (36.6 C)  SpO2: 94% 98%  Wt Readings from Last 3 Encounters:  06/15/20 (!) 153 kg  06/11/20 (!) 153 kg  06/10/20 (!) 154.4 kg    General: Obese, pleasant, comfortable.  Does not look acutely or chronically ill Head: No facial asymmetry or swelling.  No signs of head trauma. Eyes: No  conjunctival pallor.  No scleral icterus Ears: Not hard of hearing Nose: No congestion or discharge Mouth: Oral mucosa is moist, pink, clear.  Tongue is midline. Neck:+ JVD, no masses, no thyromegaly Lungs: Diminished throughout.  Some expiratory wheezes that seem due to his vigorous effort to exhale.  No dyspnea at rest.  Occasional cough. Heart: RRR.  No MRG.  S1, S2 present Abdomen: Obese, nontender, nondistended.  No HSM, masses, bruits, hernias.   Rectal: Deferred.  Stool FOBT positive at the lab.  Musc/Skeltl: No joint redness, swelling or gross deformities. Extremities: Minimal pretibial/pedal edema. Neurologic: Alert.  Oriented x3.  Provides good history.  No tremors, moves all 4 limbs, strength not tested but grossly without deficits or weakness Skin: No obvious rashes, sores or suspicious lesions. Nodes: No cervical adenopathy Psych: Calm, pleasant, cooperative  Intake/Output from previous day: 07/20 0701 - 07/21 0700 In: 630 [Blood:630] Out: 1400 [Urine:1400] Intake/Output this shift: Total I/O In: -  Out: 3400 [Urine:3400]  LAB RESULTS: Recent Labs    06/15/20 1205 06/15/20 1205 06/15/20 1241 06/15/20 1438 06/16/20 0954  WBC 6.2  --   --  6.8 8.7  HGB 5.4*   < > 7.5* 5.6* 7.3*  HCT 20.9*   < > 22.0* 22.2* 27.1*  PLT 231  --   --  264 250   < > = values in this interval not displayed.   BMET Lab Results  Component Value Date   NA 140 06/16/2020   NA 142 06/15/2020   NA 139 06/15/2020   K 4.1 06/16/2020   K 4.2 06/15/2020   K 4.2 06/15/2020   CL 100 06/16/2020   CL 101 06/15/2020   CL 103 06/10/2020   CO2 33 (H) 06/16/2020   CO2 29 06/15/2020   CO2 24 06/10/2020   GLUCOSE 100 (H) 06/16/2020   GLUCOSE 158 (H) 06/15/2020   GLUCOSE 331 (H) 06/10/2020   BUN 32 (H) 06/16/2020   BUN 40 (H) 06/15/2020   BUN 26 (H) 06/10/2020   CREATININE 1.58 (H) 06/16/2020   CREATININE 2.00 (H) 06/15/2020   CREATININE 1.58 (H) 06/10/2020   CALCIUM 8.6 (L) 06/16/2020    CALCIUM 8.1 (L) 06/15/2020   CALCIUM 8.4 (L) 06/10/2020   LFT No results for input(s): PROT, ALBUMIN, AST, ALT, ALKPHOS, BILITOT, BILIDIR, IBILI in the last 72 hours. PT/INR Lab Results  Component Value Date   INR 0.88 09/23/2011   Hepatitis Panel No results for input(s): HEPBSAG, HCVAB, HEPAIGM, HEPBIGM in the last 72 hours. C-Diff No components found for: CDIFF Lipase  No results found for: LIPASE  Drugs of Abuse     Component Value Date/Time   LABOPIA NONE DETECTED 06/15/2020 1414   COCAINSCRNUR NONE DETECTED 06/15/2020 1414   LABBENZ NONE DETECTED 06/15/2020 1414   AMPHETMU NONE DETECTED 06/15/2020 1414   THCU NONE DETECTED 06/15/2020 1414   LABBARB NONE DETECTED 06/15/2020 1414     RADIOLOGY STUDIES: DG Chest 1 View  Result Date: 06/16/2020 CLINICAL DATA:  Respiratory failure EXAM: CHEST  1 VIEW COMPARISON:  06/15/2020 FINDINGS: Frontal view of the chest demonstrates persistent enlargement of the cardiac silhouette. There is persistent central vascular congestion, with mild increased interstitial prominence consistent  with interstitial edema. No effusion or pneumothorax. No focal consolidation. IMPRESSION: 1. Slight progression of interstitial edema. Electronically Signed   By: Randa Ngo M.D.   On: 06/16/2020 03:37   US RENAL  Result Date: 06/15/2020 CLINICAL DATA:  Hypertension, diabetes. EXAM: RENAL / URINARY TRACT ULTRASOUND COMPLETE COMPARISON:  None. FINDINGS: Right Kidney: Renal measurements: 12.2 x 4.7 x 6.2 cm = volume: 185.9 mL . Echogenicity within normal limits. No mass or hydronephrosis visualized. Left Kidney: Renal measurements: 13.8 x 7.0 x 6.8 cm = volume: 345.9 mL. Echogenicity within normal limits. No mass or hydronephrosis visualized. Bladder: Appears normal for degree of bladder distention. Other: None. IMPRESSION: Normal renal ultrasound. Electronically Signed   By: Fidela Salisbury M.D.   On: 06/15/2020 16:35   DG Chest Portable 1  View  Result Date: 06/15/2020 CLINICAL DATA:  Generalized edema EXAM: PORTABLE CHEST 1 VIEW COMPARISON:  Apr 06, 2020 FINDINGS: The cardiomediastinal silhouette is unchanged and enlarged in contour. No significant pleural effusion. No pneumothorax. No acute pleuroparenchymal abnormality. Unchanged perihilar vascular prominence. Mild diffuse interstitial prominence and peribronchial cuffing. Visualized abdomen is unremarkable. Multilevel degenerative changes of the thoracic spine. IMPRESSION: Constellation of findings are favored to reflect mild pulmonary edema. Electronically Signed   By: Valentino Saxon MD   On: 06/15/2020 13:05     IMPRESSION:   *   FOBT positive.  Dark but not melenic stools.  *    Recurrent acute anemia.  Underwent 03/2020 enteroscopy at which point multiple AVMs seen, 2 of these were bleeding and treated with clips.  All AVMs were treated with APC.  *    Chronic low-dose aspirin and Eliquis.  Last dose of Eliquis 7/19  *     CHF.  LVEF 30% by echo in 03/2020.  Also seen mild elevation pulmonary pressures, grade 2 DD.   PLAN:     *   Enteroscopy, ? Colonoscopy?? Ok to eat now.  Dr Henrene Pastor will see pt later.     Azucena Freed  06/16/2020, 1:05 PM Phone [phone]  GI ATTENDING  History, laboratories, x-rays, prior endoscopy results reviewed.  Patient seen and examined.  Agree with comprehensive consultation note as outlined above.  Patient has multiple significant medical problems.  On chronic Eliquis therapy, last dose June 14, 2020.  Has had recurrent issues with iron deficiency anemia/anemia.  Now with recurrent symptomatic anemia.  Hemoccult positive stool.  Known upper GI AVMs as recently as 2 months ago.  Suspect ongoing intermittent bleeding secondary to the same.  Agree with plans for EGD/enteroscopy to assess for source of upper GI bleeding and treat any visualized AVMs.  The patient is HIGH RISK. The nature of the procedure, as well as the risks, benefits,  and alternatives were carefully and thoroughly reviewed with the patient. Ample time for discussion and questions allowed. The patient understood, was satisfied, and agreed to proceed. In the interim, please transfuse to desired hemoglobin level to achieve maximal clinical benefit.  The examination has been tentatively set up for tomorrow at [place]. Geri Seminole., M.D. [facility] Division of Gastroenterology

## 2020-06-16 NOTE — ED Notes (Signed)
Rate dose change in blood to 120

## 2020-06-16 NOTE — Progress Notes (Signed)
PROGRESS NOTE  BRYLEY KOVACEVIC MIW:[id number] DOB: 06/01/1961 DOA: 06/15/2020 PCP: Nolene Ebbs, MD  HPI/Recap of past 24 hours: HPI from Dr Colman Cater is a 59 y.o. male with history h/o hypertension, CAD, CHF with low EF 30%, DM, depression/schizophrenia who is closely followed by heart failure team as outpatient presents with complaints of persistent leg swellings and dyspnea. Patient was seen in heart failure clinic on July 15 and noted to have volume overload, received 80 mg IV Lasix in the clinic with potassium and was being monitored closely as outpatient.  He was directed to present to the ED today after HF team noted persistent decompensated state and concern for worsening leg/pulmonary edema.  Patient states he has been feeling "congested" in his chest and face over the last week.  He states he was building up fluid everywhere in his body which prompted him to come to the ED.  He uses CPAP at night for sleep apnea but not on home O2. Denies any chest pain or productive cough. In the ED, VSS except for afebrile, pulse ox on arrival 94% on room air but patient noted to be dropping sats to 70% at times while in the ED.  Placed on 4 L O2 and sats now at 97- 100%.  Lab work-up revealed hemoglobin 5.4, BUN 40, creatinine 2.0 (baseline appears to be around 0.9 -1.3).  BNP at 1620.  Troponin I elevated at 55. Patient received 80 mg IV Lasix in the ED and has orders for blood transfusion.  Patient admitted for further management.     Early this a.m., patient was noted to be extremely lethargic and minimally responsive, lung exam showed coarse breath sounds with expiratory wheezing.  Patient was given IV Lasix and neb treatments with CPAP.  Later today, patient reports feeling better, was awake, alert, oriented when I saw patient, denied any chest pain, worsening shortness of breath, abdominal pain, nausea/vomiting, fever/chills.   Assessment/Plan: Principal Problem:   CHF  exacerbation (HCC) Active Problems:   GAD (generalized anxiety disorder)   Type 2 diabetes mellitus with hyperglycemia, with long-term current use of insulin (HCC)   Diabetic polyneuropathy associated with type 2 diabetes mellitus (HCC)   PAF (paroxysmal atrial fibrillation) (HCC)   OSA on CPAP   Anemia due to chronic blood loss   AVM (arteriovenous malformation) of small bowel, acquired   AKI (acute kidney injury) ([place])   CKD (chronic kidney disease), stage III   Acute hypoxic respiratory failure likely 2/2 acute on chronic systolic/diastolic HF Mixed ischemic/nonischemic cardiomyopathy Currently on room air BNP 1600, appears volume overloaded Chest x-ray with pulmonary edema Echo in May 2021 reviewed EF of 25%, grade 2 diastolic dysfunction Heart failure team on board, continue IV Lasix, spironolactone, Coreg.  Hold Entresto for now Strict I's and O's, daily weights DuoNebs as needed, supplemental oxygen as needed/CPAP Telemetry  AKI on CKD stage IIIa Improving Baseline creatinine around 0.9-1.3, on presentation Cr 2 Renal ultrasound unremarkable Continue diuresis, likely renal congestion Daily BMP  Possible upper GI bleed, recurrent FOBT positive History of AVMs stomach, duodenum via small bowel enteroscopy in 03/2020, treated with APC Continue to hold Eliquis (cardiology considering watchman's procedure for patient), continue Protonix GI consulted, appreciate recs Daily CBC  Acute on chronic blood loss anemia Likely 2/2 above Hemoglobin dropped to 5.6, status post 2 units of PRBC Anemia panel showed iron 9, sats 2, ferritin 13, folate 38.6, vitamin B12 852 Give Feraheme on 06/16/2020, continue PO iron supplementation  Serial CBC  Elevated troponin/CAD Chest pain-free Troponin 55-73 Likely from demand ischemia from severe anemia and volume overload Continue Lipitor, not on aspirin given Eliquis use/recurrent GI bleeds Monitor closely  Paroxysmal A. Fib Rate  controlled Continue to hold Eliquis due to above (cardiology considering watchman procedure) Continue Coreg  Diabetes mellitus type 2/with neuropathy A1c done on 04/16/2020 showed 6.5 SSI, Accu-Cheks, hypoglycemic protocol, continue home 75/25 insulin Continue Neurontin  Obstructive sleep apnea Continue CPAP  Anxiety/depression Continue Seroquel  Morbid obesity Lifestyle modification advised        Malnutrition Type:      Malnutrition Characteristics:      Nutrition Interventions:       Estimated body mass index is 43.31 kg/m as calculated from the following:   Height as of this encounter: 6\' 2"  (1.88 m).   Weight as of this encounter: 153 kg.     Code Status: Full  Family Communication: Discussed with patient extensively  Disposition Plan: Status is: Inpatient  Remains inpatient appropriate because:Inpatient level of care appropriate due to severity of illness   Dispo: The patient is from: Home              Anticipated d/c is to: Home              Anticipated d/c date is: 2 days              Patient currently is not medically stable to d/c.    Consultants:  Cardiology/heart failure  GI  Procedures:  None  Antimicrobials:  None  DVT prophylaxis: SCDs   Objective: Vitals:   06/16/20 0926 06/16/20 1223 06/16/20 1252 06/16/20 1343  BP:  (!) 149/86 (!) 151/93 137/78  Pulse: 70 66 74 65  Resp: 18 19 (!) 21 20  Temp:  98.6 F (37 C) 97.9 F (36.6 C) 99 F (37.2 C)  TempSrc:   Oral Oral  SpO2: 95% 94% 98% 97%  Weight:      Height:        Intake/Output Summary (Last 24 hours) at 06/16/2020 1343 Last data filed at 06/16/2020 0955 Gross per 24 hour  Intake 630 ml  Output 4800 ml  Net -4170 ml   Filed Weights   06/15/20 1154  Weight: (!) 153 kg    Exam:  General: NAD, poor dentition  Cardiovascular: +JVD, S1, S2 present  Respiratory:  Bibasilar crackles, coarse breath sounds  Abdomen: Soft, nontender, nondistended,  bowel sounds present  Musculoskeletal: 1+ bilateral pedal edema noted  Skin: Normal  Psychiatry: Normal mood   Data Reviewed: CBC: Recent Labs  Lab 06/15/20 1205 06/15/20 1241 06/15/20 1438 06/16/20 0954  WBC 6.2  --  6.8 8.7  NEUTROABS 4.5  --   --   --   HGB 5.4* 7.5* 5.6* 7.3*  HCT 20.9* 22.0* 22.2* 27.1*  MCV 82.0  --  82.2 82.9  PLT 231  --  264 938   Basic Metabolic Panel: Recent Labs  Lab 06/10/20 1153 06/15/20 1205 06/15/20 1241 06/16/20 0525  NA 138 139 142 140  K 4.2 4.2 4.2 4.1  CL 103 101  --  100  CO2 24 29  --  33*  GLUCOSE 331* 158*  --  100*  BUN 26* 40*  --  32*  CREATININE 1.58* 2.00*  --  1.58*  CALCIUM 8.4* 8.1*  --  8.6*  MG  --  2.1  --   --    GFR: Estimated Creatinine Clearance: 77.7 mL/min (A) (  by C-G formula based on SCr of 1.58 mg/dL (H)). Liver Function Tests: No results for input(s): AST, ALT, ALKPHOS, BILITOT, PROT, ALBUMIN in the last 168 hours. No results for input(s): LIPASE, AMYLASE in the last 168 hours. No results for input(s): AMMONIA in the last 168 hours. Coagulation Profile: No results for input(s): INR, PROTIME in the last 168 hours. Cardiac Enzymes: No results for input(s): CKTOTAL, CKMB, CKMBINDEX, TROPONINI in the last 168 hours. BNP (last 3 results) Recent Labs    11/03/19 1647 03/15/20 0832  PROBNP 6,177* 231*   HbA1C: No results for input(s): HGBA1C in the last 72 hours. CBG: Recent Labs  Lab 06/15/20 1936 06/16/20 0133  GLUCAP 135* 104*   Lipid Profile: No results for input(s): CHOL, HDL, LDLCALC, TRIG, CHOLHDL, LDLDIRECT in the last 72 hours. Thyroid Function Tests: No results for input(s): TSH, T4TOTAL, FREET4, T3FREE, THYROIDAB in the last 72 hours. Anemia Panel: Recent Labs    06/15/20 1438  VITAMINB12 852  FOLATE 38.6  FERRITIN 13*  TIBC 476*  IRON 9*  RETICCTPCT 3.8*   Urine analysis:    Component Value Date/Time   COLORURINE AMBER BIOCHEMICALS MAY BE AFFECTED BY COLOR (A)  06/10/2010 2207   APPEARANCEUR CLEAR 06/10/2010 2207   LABSPEC 1.039 (H) 06/10/2010 2207   PHURINE 5.5 06/10/2010 2207   GLUCOSEU NEGATIVE 06/10/2010 2207   HGBUR NEGATIVE 06/10/2010 2207   BILIRUBINUR Negative 01/12/2020 0809   KETONESUR 15 (A) 06/10/2010 2207   PROTEINUR Negative 01/12/2020 0809   PROTEINUR NEGATIVE 06/10/2010 2207   UROBILINOGEN 0.2 01/12/2020 0809   UROBILINOGEN 1.0 06/10/2010 2207   NITRITE Negative 01/12/2020 0809   NITRITE NEGATIVE 06/10/2010 2207   LEUKOCYTESUR Negative 01/12/2020 0809   Sepsis Labs: @LABRCNTIP (procalcitonin:4,lacticidven:4)  ) Recent Results (from the past 240 hour(s))  SARS Coronavirus 2 by RT PCR (hospital order, performed in [place] hospital lab) Nasopharyngeal Nasopharyngeal Swab     Status: None   Collection Time: 06/15/20 12:06 PM   Specimen: Nasopharyngeal Swab  Result Value Ref Range Status   SARS Coronavirus 2 NEGATIVE NEGATIVE Final    Comment: (NOTE) SARS-CoV-2 target nucleic acids are NOT DETECTED.  The SARS-CoV-2 RNA is generally detectable in upper and lower respiratory specimens during the acute phase of infection. The lowest concentration of SARS-CoV-2 viral copies this assay can detect is 250 copies / mL. A negative result does not preclude SARS-CoV-2 infection and should not be used as the sole basis for treatment or other patient management decisions.  A negative result may occur with improper specimen collection / handling, submission of specimen other than nasopharyngeal swab, presence of viral mutation(s) within the areas targeted by this assay, and inadequate number of viral copies (<250 copies / mL). A negative result must be combined with clinical observations, patient history, and epidemiological information.  Fact Sheet for Patients:   StrictlyIdeas.no  Fact Sheet for Healthcare Providers: BankingDealers.co.za  This test is not yet approved or  cleared  by the [place] FDA and has been authorized for detection and/or diagnosis of SARS-CoV-2 by FDA under an Emergency Use Authorization (EUA).  This EUA will remain in effect (meaning this test can be used) for the duration of the COVID-19 declaration under Section 564(b)(1) of the Act, 21 U.S.C. section 360bbb-3(b)(1), unless the authorization is terminated or revoked sooner.  Performed at [facility], [place] [address] [place], [place] [address]       Studies: DG Chest 1 View  Result Date: 06/16/2020 CLINICAL DATA:  Respiratory failure EXAM: CHEST  1 VIEW COMPARISON:  06/15/2020 FINDINGS: Frontal view of the chest demonstrates persistent enlargement of the cardiac silhouette. There is persistent central vascular congestion, with mild increased interstitial prominence consistent with interstitial edema. No effusion or pneumothorax. No focal consolidation. IMPRESSION: 1. Slight progression of interstitial edema. Electronically Signed   By: Randa Ngo M.D.   On: 06/16/2020 03:37   US RENAL  Result Date: 06/15/2020 CLINICAL DATA:  Hypertension, diabetes. EXAM: RENAL / URINARY TRACT ULTRASOUND COMPLETE COMPARISON:  None. FINDINGS: Right Kidney: Renal measurements: 12.2 x 4.7 x 6.2 cm = volume: 185.9 mL . Echogenicity within normal limits. No mass or hydronephrosis visualized. Left Kidney: Renal measurements: 13.8 x 7.0 x 6.8 cm = volume: 345.9 mL. Echogenicity within normal limits. No mass or hydronephrosis visualized. Bladder: Appears normal for degree of bladder distention. Other: None. IMPRESSION: Normal renal ultrasound. Electronically Signed   By: Fidela Salisbury M.D.   On: 06/15/2020 16:35    Scheduled Meds: . atorvastatin  40 mg Oral q1800  . carvedilol  6.25 mg Oral BID WC  . ferrous sulfate  325 mg Oral Q breakfast  . furosemide  80 mg Intravenous BID  . gabapentin  600 mg Oral BID  . insulin aspart protamine- aspart  24 Units Subcutaneous BID WC  . multivitamin  with minerals  1 tablet Oral Daily  . pantoprazole  40 mg Oral BID  . potassium chloride SA  20 mEq Oral Daily  . QUEtiapine  200 mg Oral QHS  . spironolactone  25 mg Oral Daily    Continuous Infusions:   LOS: 1 day     [place] Friendly, MD [facility]  If 7PM-7AM, please contact night-coverage [email or web address] 06/16/2020, 1:43 PM

## 2020-06-16 NOTE — ED Notes (Signed)
Breakfast ordered--Sharona Rovner 

## 2020-06-16 NOTE — Significant Event (Signed)
HOSPITAL MEDICINE OVERNIGHT EVENT NOTE  Notified by nursing that patient is extremely lethargic and minimally responsive to verbal and painful stimuli.  Patient evaluated at the bedside, patient is extremely lethargic and not following commands.  Lung examination reveals coarse breath sounds in all fields with expiratory wheezing.  ABG ordered revealing substantial hypercapnia however with normal pH.  Suspect patient is suffering from bronchospasm due to volume overload status post packed red blood cell transfusion.  Will provide patient with nebulized albuterol treatment in addition to additional 80 mg of IV Lasix x1.  Continue CPAP with sleep.  Monitor closely.  Sherryll Burger Cheney Gosch

## 2020-06-16 NOTE — ED Notes (Signed)
Paged admitting to [place] per his request

## 2020-06-17 ENCOUNTER — Encounter (HOSPITAL_COMMUNITY): Payer: Self-pay | Admitting: Internal Medicine

## 2020-06-17 ENCOUNTER — Inpatient Hospital Stay (HOSPITAL_COMMUNITY): Payer: Medicare Other | Admitting: Anesthesiology

## 2020-06-17 ENCOUNTER — Encounter (HOSPITAL_COMMUNITY)
Admission: EM | Disposition: A | Discharge status: Home / Self Care | Payer: Self-pay | Source: Home / Self Care | Attending: Internal Medicine

## 2020-06-17 DIAGNOSIS — N179 Acute kidney failure, unspecified: Secondary | ICD-10-CM | POA: Diagnosis not present

## 2020-06-17 DIAGNOSIS — I5043 Acute on chronic combined systolic (congestive) and diastolic (congestive) heart failure: Secondary | ICD-10-CM | POA: Diagnosis not present

## 2020-06-17 HISTORY — PX: HEMOSTASIS CONTROL: SHX6838

## 2020-06-17 HISTORY — PX: ENTEROSCOPY: SHX5533

## 2020-06-17 LAB — BASIC METABOLIC PANEL
Anion gap: 8 (ref 5–15)
BUN: 17 mg/dL (ref 6–20)
CO2: 33 mmol/L — ABNORMAL HIGH (ref 22–32)
Calcium: 8.6 mg/dL — ABNORMAL LOW (ref 8.9–10.3)
Chloride: 99 mmol/L (ref 98–111)
Creatinine, Ser: 1.16 mg/dL (ref 0.61–1.24)
GFR calc Af Amer: >60 mL/min (ref >60)
GFR calc non Af Amer: >60 mL/min (ref >60)
Glucose, Bld: 141 mg/dL — ABNORMAL HIGH (ref 70–99)
Potassium: 4.1 mmol/L (ref 3.5–5.1)
Sodium: 140 mmol/L (ref 135–145)

## 2020-06-17 LAB — TYPE AND SCREEN
ABO/RH(D): B POS
Antibody Screen: NEGATIVE
Unit division: 0
Unit division: 0
Unit division: 0

## 2020-06-17 LAB — CBC WITH DIFFERENTIAL/PLATELET
Abs Immature Granulocytes: 0 10*3/uL (ref 0.00–0.07)
Basophils Absolute: 0 10*3/uL (ref 0.0–0.1)
Basophils Relative: 0 %
Eosinophils Absolute: 0.6 10*3/uL — ABNORMAL HIGH (ref 0.0–0.5)
Eosinophils Relative: 7 %
HCT: 33.5 % — ABNORMAL LOW (ref 39.0–52.0)
Hemoglobin: 9.3 g/dL — ABNORMAL LOW (ref 13.0–17.0)
Lymphocytes Relative: 22 %
Lymphs Abs: 1.8 10*3/uL (ref 0.7–4.0)
MCH: 22.7 pg — ABNORMAL LOW (ref 26.0–34.0)
MCHC: 27.8 g/dL — ABNORMAL LOW (ref 30.0–36.0)
MCV: 81.7 fL (ref 80.0–100.0)
Monocytes Absolute: 0.2 10*3/uL (ref 0.1–1.0)
Monocytes Relative: 2 %
Neutro Abs: 5.6 10*3/uL (ref 1.7–7.7)
Neutrophils Relative %: 69 %
Platelets: 260 10*3/uL (ref 150–400)
RBC: 4.1 MIL/uL — ABNORMAL LOW (ref 4.22–5.81)
RDW: 17.2 % — ABNORMAL HIGH (ref 11.5–15.5)
WBC: 8.1 10*3/uL (ref 4.0–10.5)
nRBC: 0 % (ref 0.0–0.2)

## 2020-06-17 LAB — BPAM RBC
Blood Product Expiration Date: 202108202359
Blood Product Expiration Date: 202108202359
Blood Product Expiration Date: 202108202359
ISSUE DATE / TIME: 202107201733
ISSUE DATE / TIME: 202107202254
ISSUE DATE / TIME: 202107211213
Unit Type and Rh: 7300
Unit Type and Rh: 7300
Unit Type and Rh: 7300

## 2020-06-17 LAB — GLUCOSE, CAPILLARY
Glucose-Capillary: 174 mg/dL — ABNORMAL HIGH (ref 70–99)
Glucose-Capillary: 123 mg/dL — ABNORMAL HIGH (ref 70–99)
Glucose-Capillary: 143 mg/dL — ABNORMAL HIGH (ref 70–99)
Glucose-Capillary: 168 mg/dL — ABNORMAL HIGH (ref 70–99)

## 2020-06-17 SURGERY — ENTEROSCOPY
Anesthesia: Monitor Anesthesia Care

## 2020-06-17 MED ORDER — DEXMEDETOMIDINE HCL 200 MCG/2ML IV SOLN
INTRAVENOUS | Status: DC | PRN
  Administered 2020-06-17: 12 ug via INTRAVENOUS

## 2020-06-17 MED ORDER — APIXABAN 5 MG PO TABS
5.0000 mg | ORAL_TABLET | Freq: Two times a day (BID) | ORAL | Status: DC
  Administered 2020-06-17 – 2020-06-21 (×8): 5 mg via ORAL
  Filled 2020-06-17 (×8): qty 1

## 2020-06-17 MED ORDER — FERROUS SULFATE 325 (65 FE) MG PO TABS
325.0000 mg | ORAL_TABLET | Freq: Two times a day (BID) | ORAL | Status: DC
  Administered 2020-06-17 – 2020-06-21 (×8): 325 mg via ORAL
  Filled 2020-06-17 (×8): qty 1

## 2020-06-17 MED ORDER — LACTATED RINGERS IV SOLN
INTRAVENOUS | Status: AC | PRN
  Administered 2020-06-17: 1000 mL via INTRAVENOUS

## 2020-06-17 MED ORDER — PROPOFOL 500 MG/50ML IV EMUL
INTRAVENOUS | Status: DC | PRN
  Administered 2020-06-17: 100 ug/kg/min via INTRAVENOUS

## 2020-06-17 MED ORDER — SACUBITRIL-VALSARTAN 24-26 MG PO TABS
1.0000 | ORAL_TABLET | Freq: Two times a day (BID) | ORAL | Status: DC
  Administered 2020-06-17 – 2020-06-18 (×2): 1 via ORAL
  Filled 2020-06-17 (×2): qty 1

## 2020-06-17 NOTE — Op Note (Signed)
[facility] Patient Name: [name] Procedure Date : 06/17/2020 MRN: [id number] Attending MD: Docia Chuck. Henrene Pastor , MD Date of Birth: 21-Dec-1960 CSN: [id number] Age: 59 Admit Type: Inpatient Procedure:                Small bowel enteroscopy with control of bleeding Indications:              Anemia, Occult blood in stool Providers:                Docia Chuck. Henrene Pastor, MD, Benetta Spar RN, RN, Cletis [place], Technician Referring MD:             [facility] Medicines:                Monitored Anesthesia Care Complications:            No immediate complications. Estimated Blood Loss:     Estimated blood loss: none. Procedure:                Pre-Anesthesia Assessment:                           - Prior to the procedure, a History and Physical                            was performed, and patient medications and                            allergies were reviewed. The patient's tolerance of                            previous anesthesia was also reviewed. The risks                            and benefits of the procedure and the sedation                            options and risks were discussed with the patient.                            All questions were answered, and informed consent                            was obtained. Prior Anticoagulants: The patient has                            taken Eliquis (apixaban), last dose was 4 days                            prior to procedure. ASA Grade Assessment: III - A                            patient with severe systemic disease. After  reviewing the risks and benefits, the patient was                            deemed in satisfactory condition to undergo the                            procedure.                           After obtaining informed consent, the endoscope was                            passed under direct vision. Throughout the                             procedure, the patient's blood pressure, pulse, and                            oxygen saturations were monitored continuously. The                            [id number]-[id number] ([id number] Olympus pediatric colonscope                            was introduced through the mouth and advanced to                            the proximal jejunum. The small bowel enteroscopy                            was accomplished without difficulty. The patient                            tolerated the procedure well. Scope In: Scope Out: Findings:      The esophagus was normal.      The stomach was normal, including retroflexed view (small hiatal hernia).      Multiple angiodysplastic lesions with no bleeding were found in the       second and third portion of the duodenum. The lesions measured       approximately 1 mm. A total of 6 were identified and treated.       Coagulation for bleeding prevention using argon plasma was successful.      There was no evidence of significant pathology in the proximal jejunum. Impression:               1. Multiple nonbleeding AVMs involving the second                            and third portion of the duodenum successfully                            treated with APC.                           2. Otherwise unremarkable exam. Recommendation:  1. Return to hospital ward for ongoing care                           2. Resume diet                           3. Okay to resume Eliquis today                           4. Transfuse to clinically optimal hemoglobin.                           5. Chronic oral iron therapy. Would recommend 324                            mg of ferrous sulfate twice daily                           **Findings discussed with patient. He was provided                            a copy of his procedure report. Please call for                            questions. GI will sign off.                           NOTE: The patient was a difficult airway with                             transient hypoxemia throughout the case. If he                            requires endoscopy in the future, I would recommend                            general anesthesia Procedure Code(s):        --- Professional ---                           [phone], Small intestinal endoscopy, enteroscopy                            beyond second portion of duodenum, not including                            ileum; with control of bleeding (eg, injection,                            bipolar cautery, unipolar cautery, laser, heater                            probe, stapler, plasma coagulator) Diagnosis Code(s):        --- Professional ---  K31.819, Angiodysplasia of stomach and duodenum                            without bleeding                           D64.9, Anemia, unspecified                           R19.5, Other fecal abnormalities CPT copyright 2019 American Medical Association. All rights reserved. The codes documented in this report are preliminary and upon coder review may  be revised to meet current compliance requirements. Docia Chuck. Henrene Pastor, MD 06/17/2020 10:22:28 AM This report has been signed electronically. Number of Addenda: 0

## 2020-06-17 NOTE — Anesthesia Postprocedure Evaluation (Signed)
Anesthesia Post Note  Patient: [name]  Procedure(s) Performed: ENTEROSCOPY (N/A ) HEMOSTASIS CONTROL     Patient location during evaluation: Endoscopy Anesthesia Type: MAC Level of consciousness: awake and alert Pain management: pain level controlled Vital Signs Assessment: post-procedure vital signs reviewed and stable Respiratory status: spontaneous breathing, nonlabored ventilation and respiratory function stable Cardiovascular status: blood pressure returned to baseline and stable Postop Assessment: no apparent nausea or vomiting Anesthetic complications: no   No complications documented.  Last Vitals:  Vitals:   06/17/20 1029 06/17/20 1134  BP: (!) 132/75 (!) 141/81  Pulse: 69 64  Resp: 14 20  Temp:  36.8 C  SpO2: 95% 93%    Last Pain:  Vitals:   06/17/20 1134  TempSrc: Oral  PainSc:                  Lidia Collum

## 2020-06-17 NOTE — Anesthesia Preprocedure Evaluation (Signed)
Anesthesia Evaluation  Patient identified by MRN, date of birth, ID band Patient awake    Reviewed: Allergy & Precautions, NPO status , Patient's Chart, lab work & pertinent test results  History of Anesthesia Complications Negative for: history of anesthetic complications  Airway Mallampati: II  TM Distance: >3 FB Neck ROM: Full    Dental  (+) Poor Dentition, Loose, Missing   Pulmonary sleep apnea and Continuous Positive Airway Pressure Ventilation , former smoker,    Pulmonary exam normal        Cardiovascular hypertension, + CAD, + Cardiac Stents and +CHF  Normal cardiovascular exam+ dysrhythmias Atrial Fibrillation      Neuro/Psych Anxiety Depression Schizophrenia negative neurological ROS     GI/Hepatic Neg liver ROS, Gastric AVMs   Endo/Other  diabetes, Type 2Morbid obesity  Renal/GU Renal Insufficiency and ARFRenal disease  negative genitourinary   Musculoskeletal negative musculoskeletal ROS (+)   Abdominal   Peds  Hematology  (+) anemia ,   Anesthesia Other Findings  Echo 04/05/20: EF 30%, global hypokinesis, mild LVH, YY4MG, mild RV systolic dysfunction, RVSP 37.7, normal valves  Reproductive/Obstetrics                            Anesthesia Physical Anesthesia Plan  ASA: IV  Anesthesia Plan: MAC   Post-op Pain Management:    Induction: Intravenous  PONV Risk Score and Plan: 1 and Propofol infusion, TIVA and Treatment may vary due to age or medical condition  Airway Management Planned: Natural Airway and Nasal Cannula  Additional Equipment: None  Intra-op Plan:   Post-operative Plan:   Informed Consent: I have reviewed the patients History and Physical, chart, labs and discussed the procedure including the risks, benefits and alternatives for the proposed anesthesia with the patient or authorized representative who has indicated his/her understanding and acceptance.        Plan Discussed with:   Anesthesia Plan Comments:        Anesthesia Quick Evaluation

## 2020-06-17 NOTE — Interval H&P Note (Signed)
History and Physical Interval Note:  06/17/2020 9:44 AM  [name]  has presented today for surgery, with the diagnosis of anemia. hx upper AVMs.  The various methods of treatment have been discussed with the patient and family. After consideration of risks, benefits and other options for treatment, the patient has consented to  Procedure(s): ENTEROSCOPY (N/A) as a surgical intervention.  The patient's history has been reviewed, patient examined, no change in status, stable for surgery.  I have reviewed the patient's chart and labs.  Questions were answered to the patient's satisfaction.     Scarlette Shorts

## 2020-06-17 NOTE — Transfer of Care (Signed)
Immediate Anesthesia Transfer of Care Note  Patient: [name]  Procedure(s) Performed: ENTEROSCOPY (N/A ) HEMOSTASIS CONTROL  Patient Location: Endoscopy Unit  Anesthesia Type:MAC  Level of Consciousness: awake  Airway & Oxygen Therapy: Patient Spontanous Breathing and Patient connected to nasal cannula oxygen  Post-op Assessment: Report given to RN and Post -op Vital signs reviewed and stable  Post vital signs: Reviewed and stable  Last Vitals:  Vitals Value Taken Time  BP 132/75 06/17/20 1030  Temp 36.7 C 06/17/20 1021  Pulse 67 06/17/20 1035  Resp 14 06/17/20 1035  SpO2 97 % 06/17/20 1035  Vitals shown include unvalidated device data.  Last Pain:  Vitals:   06/17/20 1029  TempSrc:   PainSc: 0-No pain         Complications: No complications documented.

## 2020-06-17 NOTE — Progress Notes (Signed)
PROGRESS NOTE  [name] ZTI:[id number] DOB: 02-09-1961 DOA: 06/15/2020 PCP: Nolene Ebbs, MD  HPI/Recap of past 24 hours: HPI from Dr Colman Cater is a 59 y.o. male with history h/o hypertension, CAD, CHF with low EF 30%, DM, depression/schizophrenia who is closely followed by heart failure team as outpatient presents with complaints of persistent leg swellings and dyspnea. Patient was seen in heart failure clinic on July 15 and noted to have volume overload, received 80 mg IV Lasix in the clinic with potassium and was being monitored closely as outpatient.  He was directed to present to the ED today after HF team noted persistent decompensated state and concern for worsening leg/pulmonary edema.  Patient states he has been feeling "congested" in his chest and face over the last week.  He states he was building up fluid everywhere in his body which prompted him to come to the ED.  He uses CPAP at night for sleep apnea but not on home O2. Denies any chest pain or productive cough. In the ED, VSS except for afebrile, pulse ox on arrival 94% on room air but patient noted to be dropping sats to 70% at times while in the ED.  Placed on 4 L O2 and sats now at 97- 100%.  Lab work-up revealed hemoglobin 5.4, BUN 40, creatinine 2.0 (baseline appears to be around 0.9 -1.3).  BNP at 1620.  Troponin I elevated at 55. Patient received 80 mg IV Lasix in the ED and has orders for blood transfusion.  Patient admitted for further management.     Today, saw patient after small bowel endoscopy, denies any new complaints, reports breathing improving, denies any abdominal pain, chest pain, melena, hematochezia.   Assessment/Plan: Principal Problem:   CHF exacerbation (HCC) Active Problems:   GAD (generalized anxiety disorder)   Type 2 diabetes mellitus with hyperglycemia, with long-term current use of insulin (HCC)   Diabetic polyneuropathy associated with type 2 diabetes mellitus  (HCC)   PAF (paroxysmal atrial fibrillation) (HCC)   OSA on CPAP   Anemia due to chronic blood loss   Angiodysplasia of small intestine   AKI (acute kidney injury) ([place])   CKD (chronic kidney disease), stage III   Acute blood loss anemia   Angiodysplasia of stomach   Chronic anticoagulation   Acute hypoxic respiratory failure likely 2/2 acute on chronic systolic/diastolic HF Mixed ischemic/nonischemic cardiomyopathy Currently on room air BNP 1600, appears volume overloaded Chest x-ray with pulmonary edema Echo in May 2021 reviewed EF of 82%, grade 2 diastolic dysfunction Heart failure team on board, continue IV Lasix, spironolactone, Coreg, restart Entresto Strict I's and O's, daily weights DuoNebs as needed, supplemental oxygen as needed/CPAP Telemetry  AKI on CKD stage IIIa Improving Baseline creatinine around 0.9-1.3, on presentation Cr 2 Renal ultrasound unremarkable Continue diuresis, likely renal congestion Daily BMP  Upper GI bleed, recurrent FOBT positive History of AVMs stomach, duodenum via small bowel enteroscopy in 03/2020, treated with APC Continue to hold Eliquis (cardiology considering watchman's procedure for patient), continue Protonix GI consulted, s/p small bowel endoscopy, which showed multiple nonbleeding angiodysplastic lesions in the second and third portion of the duodenum successfully treated with APC Daily CBC  Acute on chronic blood loss anemia Likely 2/2 above Hemoglobin dropped to 5.6, status post 2 units of PRBC Anemia panel showed iron 9, sats 2, ferritin 13, folate 38.6, vitamin B12 852 Gave Feraheme on 06/16/2020, continue PO iron supplementation Daily CBC  Elevated troponin/CAD Chest pain-free Troponin 55-73  Likely from demand ischemia from severe anemia and volume overload Continue Lipitor, not on aspirin given Eliquis use/recurrent GI bleeds Monitor closely  Paroxysmal A. Fib Rate controlled Continue to hold Eliquis due to  above Continue Coreg  Diabetes mellitus type 2/with neuropathy A1c done on 04/16/2020 showed 6.5 SSI, Accu-Cheks, hypoglycemic protocol, continue home 75/25 insulin Continue Neurontin  Obstructive sleep apnea Continue CPAP  Anxiety/depression Continue Seroquel  Morbid obesity Lifestyle modification advised        Malnutrition Type:      Malnutrition Characteristics:      Nutrition Interventions:       Estimated body mass index is 40.19 kg/m as calculated from the following:   Height as of this encounter: 6\' 2"  (1.88 m).   Weight as of this encounter: 142 kg.     Code Status: Full  Family Communication: Discussed with patient extensively  Disposition Plan: Status is: Inpatient  Remains inpatient appropriate because:Inpatient level of care appropriate due to severity of illness   Dispo: The patient is from: Home              Anticipated d/c is to: Home              Anticipated d/c date is: 2 days              Patient currently is not medically stable to d/c.    Consultants:  Cardiology/heart failure  GI  Procedures:  Small bowel endoscopy on 06/17/2020  Antimicrobials:  None  DVT prophylaxis: SCDs   Objective: Vitals:   06/17/20 1021 06/17/20 1029 06/17/20 1134 06/17/20 1634  BP: 120/65 (!) 132/75 (!) 141/81 127/76  Pulse: 64 69 64 64  Resp: 15 14 20 20   Temp: 98 F (36.7 C)  98.3 F (36.8 C) 98.2 F (36.8 C)  TempSrc: Oral  Oral Oral  SpO2: 98% 95% 93% 92%  Weight:      Height:        Intake/Output Summary (Last 24 hours) at 06/17/2020 1717 Last data filed at 06/17/2020 1600 Gross per 24 hour  Intake 1390 ml  Output 2800 ml  Net -1410 ml   Filed Weights   06/15/20 1154 06/16/20 1343 06/17/20 0222  Weight: (!) 153 kg (!) 146.4 kg (!) 142 kg    Exam:  General: NAD, poor dentition  Cardiovascular: S1, S2 present  Respiratory:  Mild bibasilar crackles  Abdomen: Soft, nontender, nondistended, bowel sounds  present  Musculoskeletal: 1+ bilateral pedal edema noted  Skin: Normal  Psychiatry: Normal mood   Data Reviewed: CBC: Recent Labs  Lab 06/15/20 1205 06/15/20 1205 06/15/20 1241 06/15/20 1438 06/16/20 0954 06/16/20 1833 06/17/20 1149  WBC 6.2  --   --  6.8 8.7 8.2 8.1  NEUTROABS 4.5  --   --   --   --   --  5.6  HGB 5.4*   < > 7.5* 5.6* 7.3* 8.4* 9.3*  HCT 20.9*   < > 22.0* 22.2* 27.1* 30.0* 33.5*  MCV 82.0  --   --  82.2 82.9 82.0 81.7  PLT 231  --   --  264 250 232 260   < > = values in this interval not displayed.   Basic Metabolic Panel: Recent Labs  Lab 06/15/20 1205 06/15/20 1241 06/16/20 0525 06/17/20 1149  NA 139 142 140 140  K 4.2 4.2 4.1 4.1  CL 101  --  100 99  CO2 29  --  33* 33*  GLUCOSE 158*  --  100* 141*  BUN 40*  --  32* 17  CREATININE 2.00*  --  1.58* 1.16  CALCIUM 8.1*  --  8.6* 8.6*  MG 2.1  --   --   --    GFR: Estimated Creatinine Clearance: 101.6 mL/min (by C-G formula based on SCr of 1.16 mg/dL). Liver Function Tests: No results for input(s): AST, ALT, ALKPHOS, BILITOT, PROT, ALBUMIN in the last 168 hours. No results for input(s): LIPASE, AMYLASE in the last 168 hours. No results for input(s): AMMONIA in the last 168 hours. Coagulation Profile: No results for input(s): INR, PROTIME in the last 168 hours. Cardiac Enzymes: No results for input(s): CKTOTAL, CKMB, CKMBINDEX, TROPONINI in the last 168 hours. BNP (last 3 results) Recent Labs    11/03/19 1647 03/15/20 0832  PROBNP 6,177* 231*   HbA1C: No results for input(s): HGBA1C in the last 72 hours. CBG: Recent Labs  Lab 06/16/20 1606 06/16/20 2132 06/17/20 0610 06/17/20 1103 06/17/20 1630  GLUCAP 142* 110* 174* 123* 143*   Lipid Profile: No results for input(s): CHOL, HDL, LDLCALC, TRIG, CHOLHDL, LDLDIRECT in the last 72 hours. Thyroid Function Tests: No results for input(s): TSH, T4TOTAL, FREET4, T3FREE, THYROIDAB in the last 72 hours. Anemia Panel: Recent Labs     06/15/20 1438  VITAMINB12 852  FOLATE 38.6  FERRITIN 13*  TIBC 476*  IRON 9*  RETICCTPCT 3.8*   Urine analysis:    Component Value Date/Time   COLORURINE AMBER BIOCHEMICALS MAY BE AFFECTED BY COLOR (A) 06/10/2010 2207   APPEARANCEUR CLEAR 06/10/2010 2207   LABSPEC 1.039 (H) 06/10/2010 2207   PHURINE 5.5 06/10/2010 2207   GLUCOSEU NEGATIVE 06/10/2010 2207   HGBUR NEGATIVE 06/10/2010 2207   BILIRUBINUR Negative 01/12/2020 0809   KETONESUR 15 (A) 06/10/2010 2207   PROTEINUR Negative 01/12/2020 0809   PROTEINUR NEGATIVE 06/10/2010 2207   UROBILINOGEN 0.2 01/12/2020 0809   UROBILINOGEN 1.0 06/10/2010 2207   NITRITE Negative 01/12/2020 0809   NITRITE NEGATIVE 06/10/2010 2207   LEUKOCYTESUR Negative 01/12/2020 0809   Sepsis Labs: @LABRCNTIP (procalcitonin:4,lacticidven:4)  ) Recent Results (from the past 240 hour(s))  SARS Coronavirus 2 by RT PCR (hospital order, performed in [facility] hospital lab) Nasopharyngeal Nasopharyngeal Swab     Status: None   Collection Time: 06/15/20 12:06 PM   Specimen: Nasopharyngeal Swab  Result Value Ref Range Status   SARS Coronavirus 2 NEGATIVE NEGATIVE Final    Comment: (NOTE) SARS-CoV-2 target nucleic acids are NOT DETECTED.  The SARS-CoV-2 RNA is generally detectable in upper and lower respiratory specimens during the acute phase of infection. The lowest concentration of SARS-CoV-2 viral copies this assay can detect is 250 copies / mL. A negative result does not preclude SARS-CoV-2 infection and should not be used as the sole basis for treatment or other patient management decisions.  A negative result may occur with improper specimen collection / handling, submission of specimen other than nasopharyngeal swab, presence of viral mutation(s) within the areas targeted by this assay, and inadequate number of viral copies (<250 copies / mL). A negative result must be combined with clinical observations, patient history, and epidemiological  information.  Fact Sheet for Patients:   StrictlyIdeas.no  Fact Sheet for Healthcare Providers: BankingDealers.co.za  This test is not yet approved or  cleared by the [place] FDA and has been authorized for detection and/or diagnosis of SARS-CoV-2 by FDA under an Emergency Use Authorization (EUA).  This EUA will remain in effect (meaning this test can be used) for the  duration of the COVID-19 declaration under Section 564(b)(1) of the Act, 21 U.S.C. section 360bbb-3(b)(1), unless the authorization is terminated or revoked sooner.  Performed at [facility], [place] [address] [place],  [address]       Studies: No results found.  Scheduled Meds: . apixaban  5 mg Oral BID  . atorvastatin  40 mg Oral q1800  . carvedilol  6.25 mg Oral BID WC  . ferrous sulfate  325 mg Oral BID WC  . furosemide  80 mg Intravenous BID  . gabapentin  600 mg Oral BID  . insulin aspart  0-9 Units Subcutaneous TID WC  . insulin aspart protamine- aspart  24 Units Subcutaneous BID WC  . multivitamin with minerals  1 tablet Oral Daily  . pantoprazole  40 mg Oral BID  . potassium chloride SA  20 mEq Oral Daily  . QUEtiapine  200 mg Oral QHS  . sacubitril-valsartan  1 tablet Oral BID  . spironolactone  25 mg Oral Daily    Continuous Infusions: . ferumoxytol Stopped (06/16/20 1707)     LOS: 2 days     Joel Friendly, MD [facility]  If 7PM-7AM, please contact night-coverage [email or web address] 06/17/2020, 5:17 PM

## 2020-06-17 NOTE — Progress Notes (Addendum)
Advanced Heart Failure Rounding Note  PCP-Cardiologist: [name], MD   Subjective:    Received total of 3 units of blood this admit.   Hgb improved, 5.6>>7.3>>8.4>>9.3  EGD done today showing multiple nonbleeding AVMs involving the second and third portion of the duodenum successfully treated with APC. Otherwise unremarkable exam.  AKI resolved. SCr 1.16 today (2.0 on admit). Excellent diuresis w/ IV Lasix -4L in UOP yesterday and an additional 2.3L charted thus far today. Wt down 9 lb. I/Os net negative 5L.   Overall feels significantly better. No resting dyspnea. No CP. Denies further melena.    BP moderately elevated, 431V-400Q systolic.    Objective:   Weight Range: (!) 142 kg Body mass index is 40.19 kg/m.   Vital Signs:   Temp:  [97.8 F (36.6 C)-98.8 F (37.1 C)] 98.3 F (36.8 C) (07/22 1134) Pulse Rate:  [57-69] 64 (07/22 1134) Resp:  [13-20] 20 (07/22 1134) BP: (120-156)/(65-95) 141/81 (07/22 1134) SpO2:  [91 %-98 %] 93 % (07/22 1134) Weight:  [142 kg] 142 kg (07/22 0222) Last BM Date: 06/16/20  Weight change: Filed Weights   06/15/20 1154 06/16/20 1343 06/17/20 0222  Weight: (!) 153 kg (!) 146.4 kg (!) 142 kg    Intake/Output:   Intake/Output Summary (Last 24 hours) at 06/17/2020 1452 Last data filed at 06/17/2020 1355 Gross per 24 hour  Intake 1910 ml  Output 2901 ml  Net -991 ml      Physical Exam    General:  [name], laying flat in bed No resp difficulty HEENT: Normal Neck: Supple. Thick neck, JVP assessment difficult . Carotids 2+ bilat; no bruits. No lymphadenopathy or thyromegaly appreciated. Cor: PMI nondisplaced. Regular rate & rhythm. No rubs, gallops or murmurs. Lungs: bilateral expiratory rhonchi/wheezing at the bases, no crackles   Abdomen: obese but soft, nontender, nondistended. No hepatosplenomegaly. No bruits or masses. Good bowel sounds. Extremities: No cyanosis, clubbing, rash, edema Neuro: Alert &  orientedx3, cranial nerves grossly intact. moves all 4 extremities w/o difficulty. Affect pleasant   Telemetry   NSR 70s   EKG    No new EKG to review today   Labs    CBC Recent Labs    06/15/20 1205 06/15/20 1241 06/16/20 1833 06/17/20 1149  WBC 6.2   < > 8.2 8.1  NEUTROABS 4.5  --   --  5.6  HGB 5.4*   < > 8.4* 9.3*  HCT 20.9*   < > 30.0* 33.5*  MCV 82.0   < > 82.0 81.7  PLT 231   < > 232 260   < > = values in this interval not displayed.   Basic Metabolic Panel Recent Labs    06/15/20 1205 06/15/20 1241 06/16/20 0525 06/17/20 1149  NA 139   < > 140 140  K 4.2   < > 4.1 4.1  CL 101   < > 100 99  CO2 29   < > 33* 33*  GLUCOSE 158*   < > 100* 141*  BUN 40*   < > 32* 17  CREATININE 2.00*   < > 1.58* 1.16  CALCIUM 8.1*   < > 8.6* 8.6*  MG 2.1  --   --   --    < > = values in this interval not displayed.   Liver Function Tests No results for input(s): AST, ALT, ALKPHOS, BILITOT, PROT, ALBUMIN in the last 72 hours. No results for input(s): LIPASE, AMYLASE in the last 72  hours. Cardiac Enzymes No results for input(s): CKTOTAL, CKMB, CKMBINDEX, TROPONINI in the last 72 hours.  BNP: BNP (last 3 results) Recent Labs    12/23/19 1113 06/10/20 1206 06/15/20 1205  BNP 1,322.6* 1,367.9* 1,620.5*    ProBNP (last 3 results) Recent Labs    11/03/19 1647 03/15/20 0832  PROBNP 6,177* 231*     D-Dimer No results for input(s): DDIMER in the last 72 hours. Hemoglobin A1C No results for input(s): HGBA1C in the last 72 hours. Fasting Lipid Panel No results for input(s): CHOL, HDL, LDLCALC, TRIG, CHOLHDL, LDLDIRECT in the last 72 hours. Thyroid Function Tests No results for input(s): TSH, T4TOTAL, T3FREE, THYROIDAB in the last 72 hours.  Invalid input(s): FREET3  Other results:   Imaging     No results found.   Medications:     Scheduled Medications: . atorvastatin  40 mg Oral q1800  . carvedilol  6.25 mg Oral BID WC  . ferrous sulfate  325  mg Oral Q breakfast  . furosemide  80 mg Intravenous BID  . gabapentin  600 mg Oral BID  . insulin aspart  0-9 Units Subcutaneous TID WC  . insulin aspart protamine- aspart  24 Units Subcutaneous BID WC  . multivitamin with minerals  1 tablet Oral Daily  . pantoprazole  40 mg Oral BID  . potassium chloride SA  20 mEq Oral Daily  . QUEtiapine  200 mg Oral QHS  . spironolactone  25 mg Oral Daily     Infusions: . ferumoxytol Stopped (06/16/20 1707)     PRN Medications:  acetaminophen, albuterol, naphazoline-glycerin   Assessment/Plan   1. Acute on Chronic Systolic Heart Failure - Echo in 11/20 with EF 25-30%. Repeat Echo 5/21 EF 30% with mildly decreased RV systolic function. Suspect mixed ischemic/nonischemic cardiomyopathy.  - Admitted w/ volume overload - volume status much improved w/ IV Lasix, I/Os net negative 5L wt down 9 lb. AKI resolved - Restart Entresto tonight - Continue Spiro 25 mg daily  - Continue Coreg 6.25 mg bid (increase back to 12.5 mg, home dose, tomorrow if BP stable). - Transition back to PO Lasix 80 qam/40 qpm   2. Anemia Due to GIB:  - recent admit 03/2020 for anemia 2/2 GIB. EGD 5/13 showed 2 bleedingangioectasias in the stomach+ 4 non-bleedingangiodysplastic lesions in the duodenum, all treated w/ APC - on chronic a/c w/ Eliquis for PAF - now readmitted for recurrent anemia, Hgb 5.4 on admit, FOBT +.  Recent melena. Denies ETOH/ NSAID use - s/p transfusion x 3 units. Hgb improved, 9.3 today  - EGD today showed multiple nonbleeding AVMs involving the second and third portion of the duodenum successfully treated with APC. Otherwise unremarkable exam. - GI has cleared him to resume Eliquis tonight. GI has s/o. Appreciate their assistance. - Increase FeSO4 to 325 mg bid per GI - continue Protonix 40 mg bid - repeat CBC in the AM.   3. AKI - Admit SCr 2.0 (baseline ~1.0) - improved w/ diuresis and normalized at 1.16 today  - Will restart home  Entresto   4. CAD:  - Last cath in 11/20 with occluded OM2 stent, 90% D1, severe diffuse disease in the RCA (no intervention).  - denies CP.  Hs troponin 55>>73, suspect demand ischemia from severe anemia and volume overload.  - no plan for ischemic w/u. - No ASA given apixaban use/ recurrent GIBs.  - Continue atorvastatin. Good lipids in 1/21.   5. PAF:   - in NSR currently.  -  ok to resume Eliquis tonight per GI  - given recurrent GIBs w/ severe anemia he will be consider for Watchman device. EP aware and will see as outpatient   6. DMII:  - insulin per primary team    7. OSA:  - CPAP QHS   Length of Stay: 2  Joel Jester, PA-C  06/17/2020, 2:52 PM  Advanced Heart Failure Team Pager [phone] (M-F; 7a - 4p)  Please contact [facility] for night-coverage after hours (4p -7a ) and weekends on [email or web address]  Patient seen with PA, agree with the above note.   He diuresed well, weight coming down.  Creatinine better at 1.1. He is s/p EGD today with APC to duodenal AVMs.    General: NAD Neck: Thick, JVP 12 cm, no thyromegaly or thyroid nodule.  Lungs: Clear to auscultation bilaterally with normal respiratory effort. CV: Nondisplaced PMI.  Heart regular S1/S2, no S3/S4, no murmur.  Trace ankle edema.   Abdomen: Soft, nontender, no hepatosplenomegaly, no distention.  Skin: Intact without lesions or rashes.  Neurologic: Alert and oriented x 3.  Psych: Normal affect. Extremities: No clubbing or cyanosis.  HEENT: Normal.   Hemoglobin better today, no further transfusion needed.  He had APC to AVMs today, if stable will restart apixaban tomorrow.   On exam, I think that he is still volume overloaded.  Creatinine is back to baseline.  Will restart Entresto today and increase Coreg back to home dose.  Would give him a couple more doses of Lasix 80 mg IV bid.   Loralie Champagne 06/17/2020 3:57 PM

## 2020-06-18 ENCOUNTER — Encounter (HOSPITAL_COMMUNITY): Payer: Self-pay | Admitting: Internal Medicine

## 2020-06-18 LAB — BASIC METABOLIC PANEL
Anion gap: 9 (ref 5–15)
BUN: 14 mg/dL (ref 6–20)
CO2: 35 mmol/L — ABNORMAL HIGH (ref 22–32)
Calcium: 8.5 mg/dL — ABNORMAL LOW (ref 8.9–10.3)
Chloride: 98 mmol/L (ref 98–111)
Creatinine, Ser: 1.12 mg/dL (ref 0.61–1.24)
GFR calc Af Amer: >60 mL/min (ref >60)
GFR calc non Af Amer: >60 mL/min (ref >60)
Glucose, Bld: 119 mg/dL — ABNORMAL HIGH (ref 70–99)
Potassium: 3.7 mmol/L (ref 3.5–5.1)
Sodium: 142 mmol/L (ref 135–145)

## 2020-06-18 LAB — CBC WITH DIFFERENTIAL/PLATELET
Abs Immature Granulocytes: 0 10*3/uL (ref 0.00–0.07)
Band Neutrophils: 0 %
Basophils Absolute: 0 10*3/uL (ref 0.0–0.1)
Basophils Relative: 0 %
Blasts: 0 %
Eosinophils Absolute: 0.3 10*3/uL (ref 0.0–0.5)
Eosinophils Relative: 4 %
HCT: 33.9 % — ABNORMAL LOW (ref 39.0–52.0)
Hemoglobin: 9.4 g/dL — ABNORMAL LOW (ref 13.0–17.0)
Lymphocytes Relative: 13 %
Lymphs Abs: 1.1 10*3/uL (ref 0.7–4.0)
MCH: 23.1 pg — ABNORMAL LOW (ref 26.0–34.0)
MCHC: 27.7 g/dL — ABNORMAL LOW (ref 30.0–36.0)
MCV: 83.3 fL (ref 80.0–100.0)
Metamyelocytes Relative: 0 %
Monocytes Absolute: 0.2 10*3/uL (ref 0.1–1.0)
Monocytes Relative: 2 %
Myelocytes: 0 %
Neutro Abs: 6.6 10*3/uL (ref 1.7–7.7)
Neutrophils Relative %: 81 %
Other: 0 %
Platelets: 220 10*3/uL (ref 150–400)
Promyelocytes Relative: 0 %
RBC: 4.07 MIL/uL — ABNORMAL LOW (ref 4.22–5.81)
RDW: 17.3 % — ABNORMAL HIGH (ref 11.5–15.5)
WBC: 8.2 10*3/uL (ref 4.0–10.5)
nRBC: 0 % (ref 0.0–0.2)
nRBC: 1 /100 WBC — ABNORMAL HIGH

## 2020-06-18 LAB — GLUCOSE, CAPILLARY
Glucose-Capillary: 134 mg/dL — ABNORMAL HIGH (ref 70–99)
Glucose-Capillary: 198 mg/dL — ABNORMAL HIGH (ref 70–99)
Glucose-Capillary: 128 mg/dL — ABNORMAL HIGH (ref 70–99)
Glucose-Capillary: 183 mg/dL — ABNORMAL HIGH (ref 70–99)

## 2020-06-18 MED ORDER — FUROSEMIDE 80 MG PO TABS
80.0000 mg | ORAL_TABLET | Freq: Every day | ORAL | Status: DC
  Administered 2020-06-19 – 2020-06-20 (×2): 80 mg via ORAL
  Filled 2020-06-18 (×2): qty 1

## 2020-06-18 MED ORDER — SACUBITRIL-VALSARTAN 49-51 MG PO TABS
1.0000 | ORAL_TABLET | Freq: Two times a day (BID) | ORAL | Status: DC
  Administered 2020-06-18 – 2020-06-21 (×6): 1 via ORAL
  Filled 2020-06-18 (×6): qty 1

## 2020-06-18 MED ORDER — FUROSEMIDE 40 MG PO TABS
40.0000 mg | ORAL_TABLET | Freq: Every day | ORAL | Status: DC
  Administered 2020-06-19 – 2020-06-20 (×2): 40 mg via ORAL
  Filled 2020-06-18 (×2): qty 1

## 2020-06-18 MED ORDER — FUROSEMIDE 10 MG/ML IJ SOLN: 80.0000 mg | Freq: Two times a day (BID) | INTRAMUSCULAR | Status: DC

## 2020-06-18 NOTE — Progress Notes (Signed)
PROGRESS NOTE  [name]:[id number] DOB: 1961/01/14 DOA: 06/15/2020 PCP: Nolene Ebbs, MD  HPI/Recap of past 24 hours: HPI from Dr Colman Cater is a 59 y.o. male with history h/o hypertension, CAD, CHF with low EF 30%, DM, depression/schizophrenia who is closely followed by heart failure team as outpatient presents with complaints of persistent leg swellings and dyspnea. Patient was seen in heart failure clinic on July 15 and noted to have volume overload, received 80 mg IV Lasix in the clinic with potassium and was being monitored closely as outpatient.  He was directed to present to the ED today after HF team noted persistent decompensated state and concern for worsening leg/pulmonary edema.  Patient states he has been feeling "congested" in his chest and face over the last week.  He states he was building up fluid everywhere in his body which prompted him to come to the ED.  He uses CPAP at night for sleep apnea but not on home O2. Denies any chest pain or productive cough. In the ED, VSS except for afebrile, pulse ox on arrival 94% on room air but patient noted to be dropping sats to 70% at times while in the ED.  Placed on 4 L O2 and sats now at 97- 100%.  Lab work-up revealed hemoglobin 5.4, BUN 40, creatinine 2.0 (baseline appears to be around 0.9 -1.3).  BNP at 1620.  Troponin I elevated at 55. Patient received 80 mg IV Lasix in the ED and has orders for blood transfusion.  Patient admitted for further management.     Today, patient denies any new complaint, denies any chest pain, worsening shortness of breath, abdominal pain, nausea/vomiting, melena/hematochezia.   Assessment/Plan: Principal Problem:   CHF exacerbation (HCC) Active Problems:   GAD (generalized anxiety disorder)   Type 2 diabetes mellitus with hyperglycemia, with long-term current use of insulin (HCC)   Diabetic polyneuropathy associated with type 2 diabetes mellitus (HCC)   PAF  (paroxysmal atrial fibrillation) (HCC)   OSA on CPAP   Anemia due to chronic blood loss   Angiodysplasia of small intestine   AKI (acute kidney injury) ([place])   CKD (chronic kidney disease), stage III   Acute blood loss anemia   Angiodysplasia of stomach   Chronic anticoagulation   Acute hypoxic respiratory failure likely 2/2 acute on chronic systolic/diastolic HF Mixed ischemic/nonischemic cardiomyopathy Currently on room air BNP 1600, appears volume overloaded Chest x-ray with pulmonary edema Echo in May 2021 reviewed EF of 95%, grade 2 diastolic dysfunction Heart failure team on board, continue IV Lasix, spironolactone, Coreg, restart Entresto Strict I's and O's, daily weights DuoNebs as needed, supplemental oxygen as needed/CPAP Telemetry  AKI on CKD stage IIIa Improving Baseline creatinine around 0.9-1.3, on presentation Cr 2 Renal ultrasound unremarkable Continue diuresis Daily BMP  Upper GI bleed, recurrent FOBT positive History of AVMs stomach, duodenum via small bowel enteroscopy in 03/2020, treated with APC Restarted Eliquis (cardiology considering watchman's procedure for patient), continue Protonix GI consulted, s/p small bowel endoscopy, which showed multiple nonbleeding angiodysplastic lesions in the second and third portion of the duodenum successfully treated with APC Daily CBC  Acute on chronic blood loss anemia Likely 2/2 above Hemoglobin dropped to 5.6, status post 2 units of PRBC Anemia panel showed iron 9, sats 2, ferritin 13, folate 38.6, vitamin B12 852 Gave Feraheme on 06/16/2020, continue PO iron supplementation Daily CBC  Elevated troponin/CAD Chest pain-free Troponin 55-73 Likely from demand ischemia from severe anemia and volume  overload Continue Lipitor, not on aspirin given Eliquis use/recurrent GI bleeds Monitor closely  Paroxysmal A. Fib Rate controlled Continue Eliquis Continue Coreg  Diabetes mellitus type 2/with neuropathy A1c  done on 04/16/2020 showed 6.5 SSI, Accu-Cheks, hypoglycemic protocol, continue home 75/25 insulin Continue Neurontin  Obstructive sleep apnea Continue CPAP  Anxiety/depression Continue Seroquel  Morbid obesity Lifestyle modification advised        Malnutrition Type:      Malnutrition Characteristics:      Nutrition Interventions:       Estimated body mass index is 40.53 kg/m as calculated from the following:   Height as of this encounter: 6\' 2"  (1.88 m).   Weight as of this encounter: 143.2 kg.     Code Status: Full  Family Communication: Discussed with patient extensively  Disposition Plan: Status is: Inpatient  Remains inpatient appropriate because:Inpatient level of care appropriate due to severity of illness   Dispo: The patient is from: Home              Anticipated d/c is to: Home              Anticipated d/c date is: 2 days              Patient currently is not medically stable to d/c.    Consultants:  Cardiology/heart failure  GI  Procedures:  Small bowel endoscopy on 06/17/2020  Antimicrobials:  None  DVT prophylaxis: Eliquis   Objective: Vitals:   06/18/20 0432 06/18/20 0904 06/18/20 1150 06/18/20 1604  BP: (!) 113/47 (!) 142/72 (!) 130/88 (!) 146/81  Pulse: 57 73 74 73  Resp: 17  18 20   Temp: 98.3 F (36.8 C)  98.1 F (36.7 C) 98.3 F (36.8 C)  TempSrc: Oral  Oral Oral  SpO2: 100% 98% 95% 97%  Weight: (!) 143.2 kg     Height:        Intake/Output Summary (Last 24 hours) at 06/18/2020 1813 Last data filed at 06/18/2020 1705 Gross per 24 hour  Intake 1200 ml  Output 3250 ml  Net -2050 ml   Filed Weights   06/16/20 1343 06/17/20 0222 06/18/20 0432  Weight: (!) 146.4 kg (!) 142 kg (!) 143.2 kg    Exam:  General: NAD, poor dentition  Cardiovascular: S1, S2 present  Respiratory:  Mild bibasilar crackles  Abdomen: Soft, nontender, nondistended, bowel sounds present  Musculoskeletal: 1+ bilateral pedal edema  noted  Skin: Normal  Psychiatry: Normal mood   Data Reviewed: CBC: Recent Labs  Lab 06/15/20 1205 06/15/20 1241 06/15/20 1438 06/16/20 0954 06/16/20 1833 06/17/20 1149 06/18/20 0805  WBC 6.2   < > 6.8 8.7 8.2 8.1 8.2  NEUTROABS 4.5  --   --   --   --  5.6 PENDING  HGB 5.4*   < > 5.6* 7.3* 8.4* 9.3* 9.4*  HCT 20.9*   < > 22.2* 27.1* 30.0* 33.5* 33.9*  MCV 82.0   < > 82.2 82.9 82.0 81.7 83.3  PLT 231   < > 264 250 232 260 220   < > = values in this interval not displayed.   Basic Metabolic Panel: Recent Labs  Lab 06/15/20 1205 06/15/20 1241 06/16/20 0525 06/17/20 1149 06/18/20 0805  NA 139 142 140 140 142  K 4.2 4.2 4.1 4.1 3.7  CL 101  --  100 99 98  CO2 29  --  33* 33* 35*  GLUCOSE 158*  --  100* 141* 119*  BUN 40*  --  32* 17 14  CREATININE 2.00*  --  1.58* 1.16 1.12  CALCIUM 8.1*  --  8.6* 8.6* 8.5*  MG 2.1  --   --   --   --    GFR: Estimated Creatinine Clearance: 105.8 mL/min (by C-G formula based on SCr of 1.12 mg/dL). Liver Function Tests: No results for input(s): AST, ALT, ALKPHOS, BILITOT, PROT, ALBUMIN in the last 168 hours. No results for input(s): LIPASE, AMYLASE in the last 168 hours. No results for input(s): AMMONIA in the last 168 hours. Coagulation Profile: No results for input(s): INR, PROTIME in the last 168 hours. Cardiac Enzymes: No results for input(s): CKTOTAL, CKMB, CKMBINDEX, TROPONINI in the last 168 hours. BNP (last 3 results) Recent Labs    11/03/19 1647 03/15/20 0832  PROBNP 6,177* 231*   HbA1C: No results for input(s): HGBA1C in the last 72 hours. CBG: Recent Labs  Lab 06/17/20 1630 06/17/20 2038 06/18/20 0605 06/18/20 1149 06/18/20 1601  GLUCAP 143* 168* 134* 198* 128*   Lipid Profile: No results for input(s): CHOL, HDL, LDLCALC, TRIG, CHOLHDL, LDLDIRECT in the last 72 hours. Thyroid Function Tests: No results for input(s): TSH, T4TOTAL, FREET4, T3FREE, THYROIDAB in the last 72 hours. Anemia Panel: No results  for input(s): VITAMINB12, FOLATE, FERRITIN, TIBC, IRON, RETICCTPCT in the last 72 hours. Urine analysis:    Component Value Date/Time   COLORURINE AMBER BIOCHEMICALS MAY BE AFFECTED BY COLOR (A) 06/10/2010 2207   APPEARANCEUR CLEAR 06/10/2010 2207   LABSPEC 1.039 (H) 06/10/2010 2207   PHURINE 5.5 06/10/2010 2207   GLUCOSEU NEGATIVE 06/10/2010 2207   HGBUR NEGATIVE 06/10/2010 2207   BILIRUBINUR Negative 01/12/2020 0809   KETONESUR 15 (A) 06/10/2010 2207   PROTEINUR Negative 01/12/2020 0809   PROTEINUR NEGATIVE 06/10/2010 2207   UROBILINOGEN 0.2 01/12/2020 0809   UROBILINOGEN 1.0 06/10/2010 2207   NITRITE Negative 01/12/2020 0809   NITRITE NEGATIVE 06/10/2010 2207   LEUKOCYTESUR Negative 01/12/2020 0809   Sepsis Labs: @LABRCNTIP (procalcitonin:4,lacticidven:4)  ) Recent Results (from the past 240 hour(s))  SARS Coronavirus 2 by RT PCR (hospital order, performed in [place] hospital lab) Nasopharyngeal Nasopharyngeal Swab     Status: None   Collection Time: 06/15/20 12:06 PM   Specimen: Nasopharyngeal Swab  Result Value Ref Range Status   SARS Coronavirus 2 NEGATIVE NEGATIVE Final    Comment: (NOTE) SARS-CoV-2 target nucleic acids are NOT DETECTED.  The SARS-CoV-2 RNA is generally detectable in upper and lower respiratory specimens during the acute phase of infection. The lowest concentration of SARS-CoV-2 viral copies this assay can detect is 250 copies / mL. A negative result does not preclude SARS-CoV-2 infection and should not be used as the sole basis for treatment or other patient management decisions.  A negative result may occur with improper specimen collection / handling, submission of specimen other than nasopharyngeal swab, presence of viral mutation(s) within the areas targeted by this assay, and inadequate number of viral copies (<250 copies / mL). A negative result must be combined with clinical observations, patient history, and epidemiological  information.  Fact Sheet for Patients:   StrictlyIdeas.no  Fact Sheet for Healthcare Providers: BankingDealers.co.za  This test is not yet approved or  cleared by the [place] FDA and has been authorized for detection and/or diagnosis of SARS-CoV-2 by FDA under an Emergency Use Authorization (EUA).  This EUA will remain in effect (meaning this test can be used) for the duration of the COVID-19 declaration under Section 564(b)(1) of the Act, 21 U.S.C. section  360bbb-3(b)(1), unless the authorization is terminated or revoked sooner.  Performed at [facility], [place] [address] [place], [place] [address]       Studies: No results found.  Scheduled Meds: . apixaban  5 mg Oral BID  . atorvastatin  40 mg Oral q1800  . carvedilol  6.25 mg Oral BID WC  . ferrous sulfate  325 mg Oral BID WC  . [START ON 06/19/2020] furosemide  40 mg Oral Daily  . [START ON 06/19/2020] furosemide  80 mg Oral Daily  . gabapentin  600 mg Oral BID  . insulin aspart  0-9 Units Subcutaneous TID WC  . insulin aspart protamine- aspart  24 Units Subcutaneous BID WC  . multivitamin with minerals  1 tablet Oral Daily  . pantoprazole  40 mg Oral BID  . potassium chloride SA  20 mEq Oral Daily  . QUEtiapine  200 mg Oral QHS  . sacubitril-valsartan  1 tablet Oral BID  . spironolactone  25 mg Oral Daily    Continuous Infusions: . ferumoxytol Stopped (06/16/20 1707)     LOS: 3 days     Alma Friendly, MD [facility]  If 7PM-7AM, please contact night-coverage [email or web address] 06/18/2020, 6:13 PM

## 2020-06-18 NOTE — Evaluation (Signed)
Physical Therapy Evaluation Patient Details Name: [name] MRN: [id number] DOB: [birth year] Today's Date: 06/18/2020   History of Present Illness  Pt is 59 yo male with hx of HTN, CAD, CHF with EF 30%, DM, depression/schizophrenia who presented with leg swelling and dyspnea.  Pt found to have hgb of 5.4 and elevated troponin.  Pt has received PRBC and hgb now 9.4.  Pt was admitted with resp failure, recurrent GI bleed, afib, elevated troponin (likely demand ischemia from severe anemia). Pt has small bowel endoscopy on 06/17/20  Clinical Impression  Pt admitted with above diagnosis.  Pt has been ambulating and transferring in room independently.  He was able to ambulate 400' and perform stairs with PT safely on RA with O2 sats 92%.  Reports feels that he is near his baseline mobility other than some mild DOE.  No further acute PT needs.  Recommend ambulation with staff or independently.     Follow Up Recommendations No PT follow up    Equipment Recommendations  None recommended by PT    Recommendations for Other Services       Precautions / Restrictions Precautions Precautions: None      Mobility  Bed Mobility Overal bed mobility: Independent             General bed mobility comments: Pt found at door sitting in straight back chair talking to pt in the next room.  Has been ambulating independentlyin room.  Transfers Overall transfer level: Independent                  Ambulation/Gait Ambulation/Gait assistance: Supervision Gait Distance (Feet): 400 Feet Assistive device: None Gait Pattern/deviations: Wide base of support;Antalgic Gait velocity: normal.   General Gait Details: slight antalgic pattern - reports chronic due to hip soreness/stiffness; steady gait without LOB; reports feels near baseline other than some SHOB; DOE 2/4  Stairs Stairs: Yes Stairs assistance: Supervision Stair Management: One rail Right;Alternating pattern Number of Stairs:  4    Wheelchair Mobility    Modified Rankin (Stroke Patients Only)       Balance Overall balance assessment: Needs assistance   Sitting balance-Leahy Scale: Normal     Standing balance support: No upper extremity supported Standing balance-Leahy Scale: Good Standing balance comment: Able to ambulate and perform functional activities without UE support; did pull sock up in standing and required UE support for this                             Pertinent Vitals/Pain Pain Assessment: 0-10 Pain Score: 2  Pain Location: chronic hip soreness Pain Intervention(s): Monitored during session    Home Living Family/patient expects to be discharged to:: Private residence Living Arrangements: Other relatives Available Help at Discharge: Available PRN/intermittently Type of Home: House Home Access: Stairs to enter Entrance Stairs-Rails: Psychiatric nurse of Steps: 4 Home Layout: One level Home Equipment: None      Prior Function Level of Independence: Independent               Hand Dominance        Extremity/Trunk Assessment   Upper Extremity Assessment Upper Extremity Assessment: Overall WFL for tasks assessed    Lower Extremity Assessment Lower Extremity Assessment: Overall WFL for tasks assessed    Cervical / Trunk Assessment Cervical / Trunk Assessment: Normal  Communication   Communication: No difficulties  Cognition Arousal/Alertness: Awake/alert Behavior During Therapy: WFL for tasks assessed/performed Overall Cognitive  Status: Within Functional Limits for tasks assessed                                        General Comments General comments (skin integrity, edema, etc.): Pt on RA.  O2 sats 96% rest and 92% ambulation.    Exercises     Assessment/Plan    PT Assessment Patent does not need any further PT services  PT Problem List         PT Treatment Interventions      PT Goals (Current goals can  be found in the Care Plan section)  Acute Rehab PT Goals Patient Stated Goal: return home PT Goal Formulation: Patient unable to participate in goal setting Time For Goal Achievement: 06/18/20 Potential to Achieve Goals: Good    Frequency     Barriers to discharge        Co-evaluation               AM-PAC PT "6 Clicks" Mobility  Outcome Measure Help needed turning from your back to your side while in a flat bed without using bedrails?: None Help needed moving from lying on your back to sitting on the side of a flat bed without using bedrails?: None Help needed moving to and from a bed to a chair (including a wheelchair)?: None Help needed standing up from a chair using your arms (e.g., wheelchair or bedside chair)?: None Help needed to walk in hospital room?: None Help needed climbing 3-5 steps with a railing? : None 6 Click Score: 24    End of Session   Activity Tolerance: Patient tolerated treatment well Patient left: in chair Nurse Communication: Mobility status      Time: 3748-2707 PT Time Calculation (min) (ACUTE ONLY): 15 min   Charges:   PT Evaluation $PT Eval Low Complexity: 1 Low          Candus Braud, PT Acute Rehab Services Pager [phone] [facility] 06/18/2020, 12:08 PM

## 2020-06-18 NOTE — Progress Notes (Signed)
OT Cancellation Note  Patient Details Name: [name] MRN: [id number] DOB: 1960/12/10   Cancelled Treatment:    Reason Eval/Treat Not Completed: OT screened, no needs identified, will sign off.   Gloris Manchester OTR/L Supplemental OT, Department of rehab services [phone]  Aragorn Recker R H.  06/18/2020, 2:33 PM

## 2020-06-18 NOTE — Progress Notes (Addendum)
Advanced Heart Failure Rounding Note  PCP-Cardiologist: [name], MD   Subjective:    Received total of 3 units of blood this admit.   Hgb improved, 5.6>>7.3>>8.4>>9.3>>9.4.   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 Otherwise unremarkable exam.  AKI resolved. SCr 1.12 today (2.0 on admit).   Excellent diuresis w/ IV Lasix 5.6L in UOP. I/Os net negative 8L total this admit. Doubt wts are accurate.   Denies resting/exertional dyspnea.   Had a BM yesterday and again today. No further melena.   Objective:   Weight Range: (!) 143.2 kg Body mass index is 40.53 kg/m.   Vital Signs:   Temp:  [98.1 F (36.7 C)-98.3 F (36.8 C)] 98.1 F (36.7 C) (07/23 1150) Pulse Rate:  [57-74] 74 (07/23 1150) Resp:  [17-20] 18 (07/23 1150) BP: (113-142)/(47-88) 130/88 (07/23 1150) SpO2:  [92 %-100 %] 95 % (07/23 1150) Weight:  [143.2 kg] 143.2 kg (07/23 0432) Last BM Date: 06/17/20  Weight change: Filed Weights   06/16/20 1343 06/17/20 0222 06/18/20 0432  Weight: (!) 146.4 kg (!) 142 kg (!) 143.2 kg    Intake/Output:   Intake/Output Summary (Last 24 hours) at 06/18/2020 1439 Last data filed at 06/18/2020 1145 Gross per 24 hour  Intake 1160 ml  Output 4350 ml  Net -3190 ml      Physical Exam    General: obese, aying flat in bed No resp difficulty HEENT: Normal Neck: Supple. Thick neck, JVP assessment difficult . Carotids 2+ bilat; no bruits. No lymphadenopathy or thyromegaly appreciated. Cor: PMI nondisplaced. Regular rate & rhythm. No rubs, gallops or murmurs. Lungs: improved from day prior but continues w/ slight bilateral diffuse wheezing  Abdomen: obese but soft, nontender, nondistended. No hepatosplenomegaly. No bruits or masses. Good bowel sounds. Extremities: No cyanosis, clubbing, rash, edema Neuro: Alert & orientedx3, cranial nerves grossly intact. moves all 4  extremities w/o difficulty. Affect pleasant   Telemetry   NSR 70s   EKG    No new EKG to review today   Labs    CBC Recent Labs    06/17/20 1149 06/18/20 0805  WBC 8.1 8.2  NEUTROABS 5.6 PENDING  HGB 9.3* 9.4*  HCT 33.5* 33.9*  MCV 81.7 83.3  PLT 260 128   Basic Metabolic Panel Recent Labs    06/17/20 1149 06/18/20 0805  NA 140 142  K 4.1 3.7  CL 99 98  CO2 33* 35*  GLUCOSE 141* 119*  BUN 17 14  CREATININE 1.16 1.12  CALCIUM 8.6* 8.5*   Liver Function Tests No results for input(s): AST, ALT, ALKPHOS, BILITOT, PROT, ALBUMIN in the last 72 hours. No results for input(s): LIPASE, AMYLASE in the last 72 hours. Cardiac Enzymes No results for input(s): CKTOTAL, CKMB, CKMBINDEX, TROPONINI in the last 72 hours.  BNP: BNP (last 3 results) Recent Labs    12/23/19 1113 06/10/20 1206 06/15/20 1205  BNP 1,322.6* 1,367.9* 1,620.5*    ProBNP (last 3 results) Recent Labs    11/03/19 1647 03/15/20 0832  PROBNP 6,177* 231*     D-Dimer No results for input(s): DDIMER in the last 72 hours. Hemoglobin A1C No results for input(s): HGBA1C in the last 72 hours. Fasting Lipid Panel No results for input(s): CHOL, HDL, LDLCALC, TRIG, CHOLHDL, LDLDIRECT in the last 72 hours. Thyroid Function Tests No results for input(s): TSH, T4TOTAL, T3FREE, THYROIDAB in the last 72 hours.  Invalid input(s): FREET3  Other results:   Imaging    No results found.   Medications:     Scheduled Medications: . apixaban  5 mg Oral BID  . atorvastatin  40 mg Oral q1800  . carvedilol  6.25 mg Oral BID WC  . ferrous sulfate  325 mg Oral BID WC  . furosemide  80 mg Intravenous BID  . gabapentin  600 mg Oral BID  . insulin aspart  0-9 Units Subcutaneous TID WC  . insulin aspart protamine- aspart  24 Units Subcutaneous BID WC  . multivitamin with minerals  1 tablet Oral Daily  . pantoprazole  40 mg Oral BID  . potassium chloride SA  20 mEq Oral Daily  . QUEtiapine  200 mg  Oral QHS  . sacubitril-valsartan  1 tablet Oral BID  . spironolactone  25 mg Oral Daily    Infusions: . ferumoxytol Stopped (06/16/20 1707)    PRN Medications: acetaminophen, albuterol, naphazoline-glycerin   Assessment/Plan   1. Acute on Chronic Systolic Heart Failure - Echo in 11/20 with EF 25-30%. Repeat Echo 5/21 EF 30% with mildly decreased RV systolic function. Suspect mixed ischemic/nonischemic cardiomyopathy.  - Admitted w/ volume overload - volume status much improved w/ IV Lasix, I/Os net negative 8L wt down 9 lb. AKI resolved - Continue Entresto 24-26 bid - Continue Spiro 25 mg daily  - Continue Coreg 12.5 mg bid - Continue IV lasix through today, transition back to PO Lasix 80 qam/40 qpm tomorrow   2. Anemia Due to GIB:  - recent admit 03/2020 for anemia 2/2 GIB. EGD 5/13 showed 2 bleedingangioectasias in the stomach+ 4 non-bleedingangiodysplastic lesions in the duodenum, all treated w/ APC - on chronic a/c w/ Eliquis for PAF - now readmitted for recurrent anemia, Hgb 5.4 on admit, FOBT +.  Recent melena. Denies ETOH/ NSAID use - s/p transfusion x 3 units. Hgb improved, 9.4 today  - EGD 7/22 showed multiple nonbleeding AVMs involving the second and third portion of the duodenum successfully treated with APC. Otherwise unremarkable exam. - GI has cleared him to resume Eliquis GI has s/o. Appreciate their assistance. - Monitor CBC w/ restart of Eliquis 5 mg bid  - Continue FeSO4 to 325 mg bid per GI - continue Protonix 40 mg bid - repeat CBC in the AM.   3. AKI - Admit SCr 2.0 (baseline ~1.0) - improved w/ diuresis and normalized at 1.12 today    4. CAD:  - Last cath in 11/20 with occluded OM2 stent, 90% D1, severe diffuse disease in the RCA (no intervention).  - denies CP.  Hs troponin 55>>73, suspect demand ischemia from severe anemia and volume overload.  - no plan for ischemic w/u. - No ASA given apixaban use/ recurrent GIBs.  - Continue atorvastatin.  Good lipids in 1/21.   5. PAF:   - in NSR currently.  - ok to resume Eliquis tonight per GI  - given recurrent GIBs w/ severe anemia he will be consider for Watchman device. EP aware and will see as outpatient   6. DMII:  - insulin per primary team    7. OSA:  - CPAP QHS   Length of Stay: [address] PA-C  06/18/2020, 2:39 PM  Advanced Heart Failure Team Pager [phone] (M-F; 7a - 4p)  Please contact [facility] for night-coverage after hours (4p -7a ) and weekends on [email or web address]  Patient seen with PA, agree with the above note.   Feeling much better, no dyspnea.  Creatinine down  to 1.12, hgb stable, no BRBPR/melena.   General: NAD Neck: Thick, JVP 9-10 cm, no thyromegaly or thyroid nodule.  Lungs: Clear to auscultation bilaterally with normal respiratory effort. CV: Nondisplaced PMI.  Heart regular S1/S2, no S3/S4, no murmur.  No peripheral edema.   Abdomen: Soft, nontender, no hepatosplenomegaly, no distention.  Skin: Intact without lesions or rashes.  Neurologic: Alert and oriented x 3.  Psych: Normal affect. Extremities: No clubbing or cyanosis.  HEENT: Normal.   He is back on apixaban now s/p APC to GI AVMs.  This is his second recent GI bleed.  Watch closely back on Eliquis now s/p treatment of AVMs.  I will refer him to EP as outpatient for Watchman evaluation.   Continue IV Lasix today, start Lasix 80 qam/40 qpm tomorrow.  Mild residual volume overload.  Can increase Entresto to 49/51 bid.   Possibly home tomorrow? Reassess in am.   Loralie Champagne 06/18/2020 4:55 PM

## 2020-06-18 NOTE — Progress Notes (Signed)
Pt ambulated in the hallway at room air.  Pt denied SOB, chest pain or dizziness.  Pt dessated to 91%, purse lip breathing technique with teach back education given.  Pt is currently sitting in bed at room air, no current complaints.

## 2020-06-18 NOTE — Progress Notes (Signed)
SATURATION QUALIFICATIONS: (This note is used to comply with regulatory documentation for home oxygen)  Patient Saturations on Room Air at Rest = 97%  Patient Saturations on Room Air while Ambulating = 91%   

## 2020-06-19 LAB — CBC WITH DIFFERENTIAL/PLATELET
Abs Immature Granulocytes: 0.02 10*3/uL (ref 0.00–0.07)
Basophils Absolute: 0.1 10*3/uL (ref 0.0–0.1)
Basophils Relative: 1 %
Eosinophils Absolute: 0.3 10*3/uL (ref 0.0–0.5)
Eosinophils Relative: 4 %
HCT: 34.6 % — ABNORMAL LOW (ref 39.0–52.0)
Hemoglobin: 9.5 g/dL — ABNORMAL LOW (ref 13.0–17.0)
Immature Granulocytes: 0 %
Lymphocytes Relative: 20 %
Lymphs Abs: 1.5 10*3/uL (ref 0.7–4.0)
MCH: 22.4 pg — ABNORMAL LOW (ref 26.0–34.0)
MCHC: 27.5 g/dL — ABNORMAL LOW (ref 30.0–36.0)
MCV: 81.6 fL (ref 80.0–100.0)
Monocytes Absolute: 0.6 10*3/uL (ref 0.1–1.0)
Monocytes Relative: 8 %
Neutro Abs: 5 10*3/uL (ref 1.7–7.7)
Neutrophils Relative %: 67 %
Platelets: 262 10*3/uL (ref 150–400)
RBC: 4.24 MIL/uL (ref 4.22–5.81)
RDW: 17.2 % — ABNORMAL HIGH (ref 11.5–15.5)
WBC: 7.4 10*3/uL (ref 4.0–10.5)
nRBC: 0 % (ref 0.0–0.2)

## 2020-06-19 LAB — BASIC METABOLIC PANEL
Anion gap: 10 (ref 5–15)
BUN: 13 mg/dL (ref 6–20)
CO2: 31 mmol/L (ref 22–32)
Calcium: 8.7 mg/dL — ABNORMAL LOW (ref 8.9–10.3)
Chloride: 100 mmol/L (ref 98–111)
Creatinine, Ser: 1.04 mg/dL (ref 0.61–1.24)
GFR calc Af Amer: >60 mL/min (ref >60)
GFR calc non Af Amer: >60 mL/min (ref >60)
Glucose, Bld: 132 mg/dL — ABNORMAL HIGH (ref 70–99)
Potassium: 3.9 mmol/L (ref 3.5–5.1)
Sodium: 141 mmol/L (ref 135–145)

## 2020-06-19 LAB — GLUCOSE, CAPILLARY
Glucose-Capillary: 135 mg/dL — ABNORMAL HIGH (ref 70–99)
Glucose-Capillary: 179 mg/dL — ABNORMAL HIGH (ref 70–99)
Glucose-Capillary: 182 mg/dL — ABNORMAL HIGH (ref 70–99)
Glucose-Capillary: 194 mg/dL — ABNORMAL HIGH (ref 70–99)

## 2020-06-19 NOTE — Progress Notes (Signed)
PROGRESS NOTE  PARKS CZAJKOWSKI DGL:[id number] DOB: Mar 21, 1961 DOA: 06/15/2020 PCP: Nolene Ebbs, MD  HPI/Recap of past 24 hours: HPI from Dr Colman Cater is a 59 y.o. male with history h/o hypertension, CAD, CHF with low EF 30%, DM, depression/schizophrenia who is closely followed by heart failure team as outpatient presents with complaints of persistent leg swellings and dyspnea. Patient was seen in heart failure clinic on July 15 and noted to have volume overload, received 80 mg IV Lasix in the clinic with potassium and was being monitored closely as outpatient.  He was directed to present to the ED today after HF team noted persistent decompensated state and concern for worsening leg/pulmonary edema.  Patient states he has been feeling "congested" in his chest and face over the last week.  He states he was building up fluid everywhere in his body which prompted him to come to the ED.  He uses CPAP at night for sleep apnea but not on home O2. Denies any chest pain or productive cough. In the ED, VSS except for afebrile, pulse ox on arrival 94% on room air but patient noted to be dropping sats to 70% at times while in the ED.  Placed on 4 L O2 and sats now at 97- 100%.  Lab work-up revealed hemoglobin 5.4, BUN 40, creatinine 2.0 (baseline appears to be around 0.9 -1.3).  BNP at 1620.  Troponin I elevated at 55. Patient received 80 mg IV Lasix in the ED and has orders for blood transfusion.  Patient admitted for further management.     Today, patient denied any new complaints.   Assessment/Plan: Principal Problem:   CHF exacerbation (HCC) Active Problems:   GAD (generalized anxiety disorder)   Type 2 diabetes mellitus with hyperglycemia, with long-term current use of insulin (HCC)   Diabetic polyneuropathy associated with type 2 diabetes mellitus (HCC)   PAF (paroxysmal atrial fibrillation) (HCC)   OSA on CPAP   Anemia due to chronic blood loss   Angiodysplasia of  small intestine   AKI (acute kidney injury) ([place])   CKD (chronic kidney disease), stage III   Acute blood loss anemia   Angiodysplasia of stomach   Chronic anticoagulation   Acute hypoxic respiratory failure likely 2/2 acute on chronic systolic/diastolic HF Mixed ischemic/nonischemic cardiomyopathy Currently on room air BNP 1600, appeared volume overloaded on admission Chest x-ray with pulmonary edema Echo in May 2021 reviewed EF of 51%, grade 2 diastolic dysfunction Heart failure team on board, continue IV Lasix, spironolactone, Coreg, restart Entresto Strict I's and O's, daily weights DuoNebs as needed, supplemental oxygen as needed/CPAP Telemetry  AKI on CKD stage IIIa Improving Baseline creatinine around 0.9-1.3, on presentation Cr 2 Renal ultrasound unremarkable Continue diuresis Daily BMP  Upper GI bleed, recurrent FOBT positive History of AVMs stomach, duodenum via small bowel enteroscopy in 03/2020, treated with APC Restarted Eliquis (cardiology considering watchman's procedure for patient), continue Protonix GI consulted, s/p small bowel endoscopy, which showed multiple nonbleeding angiodysplastic lesions in the second and third portion of the duodenum successfully treated with APC Daily CBC  Acute on chronic blood loss anemia Likely 2/2 above Hemoglobin dropped to 5.6, status post 2 units of PRBC Anemia panel showed iron 9, sats 2, ferritin 13, folate 38.6, vitamin B12 852 Gave Feraheme on 06/16/2020, continue PO iron supplementation Daily CBC  Elevated troponin/CAD Chest pain-free Troponin 55-73 Likely from demand ischemia from severe anemia and volume overload Continue Lipitor, not on aspirin given Eliquis use/recurrent GI  bleeds Monitor closely  Paroxysmal A. Fib Rate controlled Continue Eliquis Continue Coreg  Diabetes mellitus type 2/with neuropathy A1c done on 04/16/2020 showed 6.5 SSI, Accu-Cheks, hypoglycemic protocol, continue home 75/25  insulin Continue Neurontin  Obstructive sleep apnea Continue CPAP  Anxiety/depression Continue Seroquel  Morbid obesity Lifestyle modification advised        Malnutrition Type:      Malnutrition Characteristics:      Nutrition Interventions:       Estimated body mass index is 40.39 kg/m as calculated from the following:   Height as of this encounter: 6\' 2"  (1.88 m).   Weight as of this encounter: 142.7 kg.     Code Status: Full  Family Communication: Discussed with patient extensively  Disposition Plan: Status is: Inpatient  Remains inpatient appropriate because:Inpatient level of care appropriate due to severity of illness   Dispo: The patient is from: Home              Anticipated d/c is to: Home              Anticipated d/c date is: 2 days              Patient currently is not medically stable to d/c.  Awaiting signout from heart failure team    Consultants:  Cardiology/heart failure  GI  Procedures:  Small bowel endoscopy on 06/17/2020  Antimicrobials:  None  DVT prophylaxis: Eliquis   Objective: Vitals:   06/19/20 0915 06/19/20 1006 06/19/20 1133 06/19/20 1642  BP: (!) 132/76 (!) 166/83 (!) 120/98 121/83  Pulse: 58 72 63 69  Resp: 20  20   Temp: 98.6 F (37 C)  98.6 F (37 C)   TempSrc: Oral  Oral   SpO2: 96% 95% 97%   Weight:      Height:        Intake/Output Summary (Last 24 hours) at 06/19/2020 1714 Last data filed at 06/19/2020 1409 Gross per 24 hour  Intake 1180 ml  Output 3051 ml  Net -1871 ml   Filed Weights   06/17/20 0222 06/18/20 0432 06/19/20 0157  Weight: (!) 142 kg (!) 143.2 kg (!) 142.7 kg    Exam:  General: NAD, poor dentition  Cardiovascular: S1, S2 present  Respiratory:  CTA B  Abdomen: Soft, nontender, nondistended, bowel sounds present  Musculoskeletal: 1+ bilateral pedal edema noted  Skin: Normal  Psychiatry: Normal mood   Data Reviewed: CBC: Recent Labs  Lab 06/15/20 1205  06/15/20 1241 06/16/20 0954 06/16/20 1833 06/17/20 1149 06/18/20 0805 06/19/20 0720  WBC 6.2   < > 8.7 8.2 8.1 8.2 7.4  NEUTROABS 4.5  --   --   --  5.6 6.6 5.0  HGB 5.4*   < > 7.3* 8.4* 9.3* 9.4* 9.5*  HCT 20.9*   < > 27.1* 30.0* 33.5* 33.9* 34.6*  MCV 82.0   < > 82.9 82.0 81.7 83.3 81.6  PLT 231   < > 250 232 260 220 262   < > = values in this interval not displayed.   Basic Metabolic Panel: Recent Labs  Lab 06/15/20 1205 06/15/20 1205 06/15/20 1241 06/16/20 0525 06/17/20 1149 06/18/20 0805 06/19/20 0720  NA 139   < > 142 140 140 142 141  K 4.2   < > 4.2 4.1 4.1 3.7 3.9  CL 101  --   --  100 99 98 100  CO2 29  --   --  33* 33* 35* 31  GLUCOSE 158*  --   --  100* 141* 119* 132*  BUN 40*  --   --  32* 17 14 13   CREATININE 2.00*  --   --  1.58* 1.16 1.12 1.04  CALCIUM 8.1*  --   --  8.6* 8.6* 8.5* 8.7*  MG 2.1  --   --   --   --   --   --    < > = values in this interval not displayed.   GFR: Estimated Creatinine Clearance: 113.7 mL/min (by C-G formula based on SCr of 1.04 mg/dL). Liver Function Tests: No results for input(s): AST, ALT, ALKPHOS, BILITOT, PROT, ALBUMIN in the last 168 hours. No results for input(s): LIPASE, AMYLASE in the last 168 hours. No results for input(s): AMMONIA in the last 168 hours. Coagulation Profile: No results for input(s): INR, PROTIME in the last 168 hours. Cardiac Enzymes: No results for input(s): CKTOTAL, CKMB, CKMBINDEX, TROPONINI in the last 168 hours. BNP (last 3 results) Recent Labs    11/03/19 1647 03/15/20 0832  PROBNP 6,177* 231*   HbA1C: No results for input(s): HGBA1C in the last 72 hours. CBG: Recent Labs  Lab 06/18/20 1601 06/18/20 2049 06/19/20 0609 06/19/20 1110 06/19/20 1600  GLUCAP 128* 183* 135* 179* 182*   Lipid Profile: No results for input(s): CHOL, HDL, LDLCALC, TRIG, CHOLHDL, LDLDIRECT in the last 72 hours. Thyroid Function Tests: No results for input(s): TSH, T4TOTAL, FREET4, T3FREE, THYROIDAB  in the last 72 hours. Anemia Panel: No results for input(s): VITAMINB12, FOLATE, FERRITIN, TIBC, IRON, RETICCTPCT in the last 72 hours. Urine analysis:    Component Value Date/Time   COLORURINE AMBER BIOCHEMICALS MAY BE AFFECTED BY COLOR (A) 06/10/2010 2207   APPEARANCEUR CLEAR 06/10/2010 2207   LABSPEC 1.039 (H) 06/10/2010 2207   PHURINE 5.5 06/10/2010 2207   GLUCOSEU NEGATIVE 06/10/2010 2207   HGBUR NEGATIVE 06/10/2010 2207   BILIRUBINUR Negative 01/12/2020 0809   KETONESUR 15 (A) 06/10/2010 2207   PROTEINUR Negative 01/12/2020 0809   PROTEINUR NEGATIVE 06/10/2010 2207   UROBILINOGEN 0.2 01/12/2020 0809   UROBILINOGEN 1.0 06/10/2010 2207   NITRITE Negative 01/12/2020 0809   NITRITE NEGATIVE 06/10/2010 2207   LEUKOCYTESUR Negative 01/12/2020 0809   Sepsis Labs: @LABRCNTIP (procalcitonin:4,lacticidven:4)  ) Recent Results (from the past 240 hour(s))  SARS Coronavirus 2 by RT PCR (hospital order, performed in [place] hospital lab) Nasopharyngeal Nasopharyngeal Swab     Status: None   Collection Time: 06/15/20 12:06 PM   Specimen: Nasopharyngeal Swab  Result Value Ref Range Status   SARS Coronavirus 2 NEGATIVE NEGATIVE Final    Comment: (NOTE) SARS-CoV-2 target nucleic acids are NOT DETECTED.  The SARS-CoV-2 RNA is generally detectable in upper and lower respiratory specimens during the acute phase of infection. The lowest concentration of SARS-CoV-2 viral copies this assay can detect is 250 copies / mL. A negative result does not preclude SARS-CoV-2 infection and should not be used as the sole basis for treatment or other patient management decisions.  A negative result may occur with improper specimen collection / handling, submission of specimen other than nasopharyngeal swab, presence of viral mutation(s) within the areas targeted by this assay, and inadequate number of viral copies (<250 copies / mL). A negative result must be combined with clinical observations,  patient history, and epidemiological information.  Fact Sheet for Patients:   StrictlyIdeas.no  Fact Sheet for Healthcare Providers: BankingDealers.co.za  This test is not yet approved or  cleared by the [place] FDA and has been authorized for detection and/or diagnosis  of SARS-CoV-2 by FDA under an Emergency Use Authorization (EUA).  This EUA will remain in effect (meaning this test can be used) for the duration of the COVID-19 declaration under Section 564(b)(1) of the Act, 21 U.S.C. section 360bbb-3(b)(1), unless the authorization is terminated or revoked sooner.  Performed at [facility], [place] [address] [place], [place] [address]       Studies: No results found.  Scheduled Meds: . apixaban  5 mg Oral BID  . atorvastatin  40 mg Oral q1800  . carvedilol  6.25 mg Oral BID WC  . ferrous sulfate  325 mg Oral BID WC  . furosemide  40 mg Oral Daily  . furosemide  80 mg Oral Daily  . gabapentin  600 mg Oral BID  . insulin aspart  0-9 Units Subcutaneous TID WC  . insulin aspart protamine- aspart  24 Units Subcutaneous BID WC  . multivitamin with minerals  1 tablet Oral Daily  . pantoprazole  40 mg Oral BID  . potassium chloride SA  20 mEq Oral Daily  . QUEtiapine  200 mg Oral QHS  . sacubitril-valsartan  1 tablet Oral BID  . spironolactone  25 mg Oral Daily    Continuous Infusions:    LOS: 4 days     Alma Friendly, MD [facility]  If 7PM-7AM, please contact night-coverage [email or web address] 06/19/2020, 5:14 PM

## 2020-06-20 DIAGNOSIS — D62 Acute posthemorrhagic anemia: Secondary | ICD-10-CM | POA: Diagnosis not present

## 2020-06-20 DIAGNOSIS — I480 Paroxysmal atrial fibrillation: Secondary | ICD-10-CM | POA: Diagnosis not present

## 2020-06-20 DIAGNOSIS — Z7901 Long term (current) use of anticoagulants: Secondary | ICD-10-CM | POA: Diagnosis not present

## 2020-06-20 LAB — GLUCOSE, CAPILLARY
Glucose-Capillary: 143 mg/dL — ABNORMAL HIGH (ref 70–99)
Glucose-Capillary: 196 mg/dL — ABNORMAL HIGH (ref 70–99)
Glucose-Capillary: 235 mg/dL — ABNORMAL HIGH (ref 70–99)
Glucose-Capillary: 204 mg/dL — ABNORMAL HIGH (ref 70–99)

## 2020-06-20 LAB — BASIC METABOLIC PANEL
Anion gap: 9 (ref 5–15)
BUN: 13 mg/dL (ref 6–20)
CO2: 32 mmol/L (ref 22–32)
Calcium: 8.7 mg/dL — ABNORMAL LOW (ref 8.9–10.3)
Chloride: 100 mmol/L (ref 98–111)
Creatinine, Ser: 1.11 mg/dL (ref 0.61–1.24)
GFR calc Af Amer: >60 mL/min (ref >60)
GFR calc non Af Amer: >60 mL/min (ref >60)
Glucose, Bld: 129 mg/dL — ABNORMAL HIGH (ref 70–99)
Potassium: 4.2 mmol/L (ref 3.5–5.1)
Sodium: 141 mmol/L (ref 135–145)

## 2020-06-20 LAB — CBC WITH DIFFERENTIAL/PLATELET
Abs Immature Granulocytes: 0.02 10*3/uL (ref 0.00–0.07)
Basophils Absolute: 0.1 10*3/uL (ref 0.0–0.1)
Basophils Relative: 1 %
Eosinophils Absolute: 0.3 10*3/uL (ref 0.0–0.5)
Eosinophils Relative: 5 %
HCT: 36.1 % — ABNORMAL LOW (ref 39.0–52.0)
Hemoglobin: 9.7 g/dL — ABNORMAL LOW (ref 13.0–17.0)
Immature Granulocytes: 0 %
Lymphocytes Relative: 20 %
Lymphs Abs: 1.4 10*3/uL (ref 0.7–4.0)
MCH: 22.2 pg — ABNORMAL LOW (ref 26.0–34.0)
MCHC: 26.9 g/dL — ABNORMAL LOW (ref 30.0–36.0)
MCV: 82.6 fL (ref 80.0–100.0)
Monocytes Absolute: 0.8 10*3/uL (ref 0.1–1.0)
Monocytes Relative: 11 %
Neutro Abs: 4.5 10*3/uL (ref 1.7–7.7)
Neutrophils Relative %: 63 %
Platelets: 227 10*3/uL (ref 150–400)
RBC: 4.37 MIL/uL (ref 4.22–5.81)
RDW: 17.2 % — ABNORMAL HIGH (ref 11.5–15.5)
WBC: 7.1 10*3/uL (ref 4.0–10.5)
nRBC: 0 % (ref 0.0–0.2)

## 2020-06-20 MED ORDER — CARVEDILOL 12.5 MG PO TABS
12.5000 mg | ORAL_TABLET | Freq: Two times a day (BID) | ORAL | Status: DC
  Administered 2020-06-20 – 2020-06-21 (×2): 12.5 mg via ORAL
  Filled 2020-06-20: qty 1

## 2020-06-20 NOTE — Progress Notes (Signed)
PROGRESS NOTE  [name] IRS:[id number] DOB: 30-Mar-1961 DOA: 06/15/2020 PCP: Nolene Ebbs, MD  HPI/Recap of past 24 hours: HPI from Dr Colman Cater is a 59 y.o. male with history h/o hypertension, CAD, CHF with low EF 30%, DM, depression/schizophrenia who is closely followed by heart failure team as outpatient presents with complaints of persistent leg swellings and dyspnea. Patient was seen in heart failure clinic on July 15 and noted to have volume overload, received 80 mg IV Lasix in the clinic with potassium and was being monitored closely as outpatient.  He was directed to present to the ED today after HF team noted persistent decompensated state and concern for worsening leg/pulmonary edema.  Patient states he has been feeling "congested" in his chest and face over the last week.  He states he was building up fluid everywhere in his body which prompted him to come to the ED.  He uses CPAP at night for sleep apnea but not on home O2. Denies any chest pain or productive cough. In the ED, VSS except for afebrile, pulse ox on arrival 94% on room air but patient noted to be dropping sats to 70% at times while in the ED.  Placed on 4 L O2 and sats now at 97- 100%.  Lab work-up revealed hemoglobin 5.4, BUN 40, creatinine 2.0 (baseline appears to be around 0.9 -1.3).  BNP at 1620.  Troponin I elevated at 55. Patient received 80 mg IV Lasix in the ED and has orders for blood transfusion.  Patient admitted for further management.    Today, patient denies any new complaints.   Assessment/Plan: Principal Problem:   CHF exacerbation (HCC) Active Problems:   GAD (generalized anxiety disorder)   Type 2 diabetes mellitus with hyperglycemia, with long-term current use of insulin (HCC)   Diabetic polyneuropathy associated with type 2 diabetes mellitus (HCC)   PAF (paroxysmal atrial fibrillation) (HCC)   OSA on CPAP   Anemia due to chronic blood loss   Angiodysplasia of  small intestine   AKI (acute kidney injury) ([place])   CKD (chronic kidney disease), stage III   Acute blood loss anemia   Angiodysplasia of stomach   Chronic anticoagulation   Acute hypoxic respiratory failure likely 2/2 acute on chronic systolic/diastolic HF Mixed ischemic/nonischemic cardiomyopathy Currently on room air BNP 1600, appeared volume overloaded on admission Chest x-ray with pulmonary edema Echo in May 2021 reviewed EF of 00%, grade 2 diastolic dysfunction Heart failure team on board, continue Lasix, spironolactone, Coreg, restart Entresto Strict I's and O's, daily weights DuoNebs as needed, supplemental oxygen as needed/CPAP Telemetry  AKI on CKD stage IIIa Improving Baseline creatinine around 0.9-1.3, on presentation Cr 2 Renal ultrasound unremarkable Continue diuresis Daily BMP  Upper GI bleed, recurrent FOBT positive History of AVMs stomach, duodenum via small bowel enteroscopy in 03/2020, treated with APC Restarted Eliquis (cardiology considering watchman's procedure for patient), continue Protonix GI consulted, s/p small bowel endoscopy, which showed multiple nonbleeding angiodysplastic lesions in the second and third portion of the duodenum successfully treated with APC Daily CBC  Acute on chronic blood loss anemia Likely 2/2 above Hemoglobin dropped to 5.6, status post 2 units of PRBC Anemia panel showed iron 9, sats 2, ferritin 13, folate 38.6, vitamin B12 852 Gave Feraheme on 06/16/2020, continue PO iron supplementation Daily CBC  Elevated troponin/CAD Chest pain-free Troponin 55-73 Likely from demand ischemia from severe anemia and volume overload Continue Lipitor, not on aspirin given Eliquis use/recurrent GI bleeds Monitor  closely  Paroxysmal A. Fib Rate controlled Continue Eliquis Continue Coreg  Diabetes mellitus type 2/with neuropathy A1c done on 04/16/2020 showed 6.5 SSI, Accu-Cheks, hypoglycemic protocol, continue home 75/25  insulin Continue Neurontin  Obstructive sleep apnea Continue CPAP  Anxiety/depression Continue Seroquel  Morbid obesity Lifestyle modification advised        Malnutrition Type:      Malnutrition Characteristics:      Nutrition Interventions:       Estimated body mass index is 39.89 kg/m as calculated from the following:   Height as of this encounter: 6\' 2"  (1.88 m).   Weight as of this encounter: 140.9 kg.     Code Status: Full  Family Communication: Discussed with patient extensively  Disposition Plan: Status is: Inpatient  Remains inpatient appropriate because:Inpatient level of care appropriate due to severity of illness   Dispo: The patient is from: Home              Anticipated d/c is to: Home              Anticipated d/c date is: 1 day              Patient currently is not medically stable to d/c.  Awaiting signout from heart failure team    Consultants:  Cardiology/heart failure  GI  Procedures:  Small bowel endoscopy on 06/17/2020  Antimicrobials:  None  DVT prophylaxis: Eliquis   Objective: Vitals:   06/20/20 0843 06/20/20 0856 06/20/20 1153 06/20/20 1707  BP: (!) 140/86 (!) 115/89 110/84 (!) 143/88  Pulse: 76 76 74 83  Resp: 20   20  Temp: 98.4 F (36.9 C)  98.9 F (37.2 C)   TempSrc: Oral  Oral   SpO2: 94%  94% 94%  Weight:      Height:        Intake/Output Summary (Last 24 hours) at 06/20/2020 1748 Last data filed at 06/20/2020 1700 Gross per 24 hour  Intake 800 ml  Output 1950 ml  Net -1150 ml   Filed Weights   06/18/20 0432 06/19/20 0157 06/20/20 0501  Weight: (!) 143.2 kg (!) 142.7 kg (!) 140.9 kg    Exam:  General: NAD, poor dentition  Cardiovascular: S1, S2 present  Respiratory:  CTA B  Abdomen: Soft, nontender, nondistended, bowel sounds present  Musculoskeletal: 1+ bilateral pedal edema noted  Skin: Normal  Psychiatry: Normal mood   Data Reviewed: CBC: Recent Labs  Lab 06/15/20 1205  06/15/20 1241 06/16/20 1833 06/17/20 1149 06/18/20 0805 06/19/20 0720 06/20/20 0622  WBC 6.2   < > 8.2 8.1 8.2 7.4 7.1  NEUTROABS 4.5  --   --  5.6 6.6 5.0 4.5  HGB 5.4*   < > 8.4* 9.3* 9.4* 9.5* 9.7*  HCT 20.9*   < > 30.0* 33.5* 33.9* 34.6* 36.1*  MCV 82.0   < > 82.0 81.7 83.3 81.6 82.6  PLT 231   < > 232 260 220 262 227   < > = values in this interval not displayed.   Basic Metabolic Panel: Recent Labs  Lab 06/15/20 1205 06/15/20 1241 06/16/20 0525 06/17/20 1149 06/18/20 0805 06/19/20 0720 06/20/20 0622  NA 139   < > 140 140 142 141 141  K 4.2   < > 4.1 4.1 3.7 3.9 4.2  CL 101   < > 100 99 98 100 100  CO2 29   < > 33* 33* 35* 31 32  GLUCOSE 158*   < > 100* 141* 119*  132* 129*  BUN 40*   < > 32* 17 14 13 13   CREATININE 2.00*   < > 1.58* 1.16 1.12 1.04 1.11  CALCIUM 8.1*   < > 8.6* 8.6* 8.5* 8.7* 8.7*  MG 2.1  --   --   --   --   --   --    < > = values in this interval not displayed.   GFR: Estimated Creatinine Clearance: 105.8 mL/min (by C-G formula based on SCr of 1.11 mg/dL). Liver Function Tests: No results for input(s): AST, ALT, ALKPHOS, BILITOT, PROT, ALBUMIN in the last 168 hours. No results for input(s): LIPASE, AMYLASE in the last 168 hours. No results for input(s): AMMONIA in the last 168 hours. Coagulation Profile: No results for input(s): INR, PROTIME in the last 168 hours. Cardiac Enzymes: No results for input(s): CKTOTAL, CKMB, CKMBINDEX, TROPONINI in the last 168 hours. BNP (last 3 results) Recent Labs    11/03/19 1647 03/15/20 0832  PROBNP 6,177* 231*   HbA1C: No results for input(s): HGBA1C in the last 72 hours. CBG: Recent Labs  Lab 06/19/20 1600 06/19/20 2219 06/20/20 0631 06/20/20 1101 06/20/20 1648  GLUCAP 182* 194* 143* 196* 235*   Lipid Profile: No results for input(s): CHOL, HDL, LDLCALC, TRIG, CHOLHDL, LDLDIRECT in the last 72 hours. Thyroid Function Tests: No results for input(s): TSH, T4TOTAL, FREET4, T3FREE, THYROIDAB  in the last 72 hours. Anemia Panel: No results for input(s): VITAMINB12, FOLATE, FERRITIN, TIBC, IRON, RETICCTPCT in the last 72 hours. Urine analysis:    Component Value Date/Time   COLORURINE AMBER BIOCHEMICALS MAY BE AFFECTED BY COLOR (A) 06/10/2010 2207   APPEARANCEUR CLEAR 06/10/2010 2207   LABSPEC 1.039 (H) 06/10/2010 2207   PHURINE 5.5 06/10/2010 2207   GLUCOSEU NEGATIVE 06/10/2010 2207   HGBUR NEGATIVE 06/10/2010 2207   BILIRUBINUR Negative 01/12/2020 0809   KETONESUR 15 (A) 06/10/2010 2207   PROTEINUR Negative 01/12/2020 0809   PROTEINUR NEGATIVE 06/10/2010 2207   UROBILINOGEN 0.2 01/12/2020 0809   UROBILINOGEN 1.0 06/10/2010 2207   NITRITE Negative 01/12/2020 0809   NITRITE NEGATIVE 06/10/2010 2207   LEUKOCYTESUR Negative 01/12/2020 0809   Sepsis Labs: @LABRCNTIP (procalcitonin:4,lacticidven:4)  ) Recent Results (from the past 240 hour(s))  SARS Coronavirus 2 by RT PCR (hospital order, performed in [place] hospital lab) Nasopharyngeal Nasopharyngeal Swab     Status: None   Collection Time: 06/15/20 12:06 PM   Specimen: Nasopharyngeal Swab  Result Value Ref Range Status   SARS Coronavirus 2 NEGATIVE NEGATIVE Final    Comment: (NOTE) SARS-CoV-2 target nucleic acids are NOT DETECTED.  The SARS-CoV-2 RNA is generally detectable in upper and lower respiratory specimens during the acute phase of infection. The lowest concentration of SARS-CoV-2 viral copies this assay can detect is 250 copies / mL. A negative result does not preclude SARS-CoV-2 infection and should not be used as the sole basis for treatment or other patient management decisions.  A negative result may occur with improper specimen collection / handling, submission of specimen other than nasopharyngeal swab, presence of viral mutation(s) within the areas targeted by this assay, and inadequate number of viral copies (<250 copies / mL). A negative result must be combined with clinical observations,  patient history, and epidemiological information.  Fact Sheet for Patients:   StrictlyIdeas.no  Fact Sheet for Healthcare Providers: BankingDealers.co.za  This test is not yet approved or  cleared by the [place] FDA and has been authorized for detection and/or diagnosis of SARS-CoV-2 by FDA under  an Emergency Use Authorization (EUA).  This EUA will remain in effect (meaning this test can be used) for the duration of the COVID-19 declaration under Section 564(b)(1) of the Act, 21 U.S.C. section 360bbb-3(b)(1), unless the authorization is terminated or revoked sooner.  Performed at [facility], [place] [address], [place], [place] [address]       Studies: No results found.  Scheduled Meds: . apixaban  5 mg Oral BID  . atorvastatin  40 mg Oral q1800  . carvedilol  12.5 mg Oral BID WC  . ferrous sulfate  325 mg Oral BID WC  . furosemide  40 mg Oral Daily  . furosemide  80 mg Oral Daily  . gabapentin  600 mg Oral BID  . insulin aspart  0-9 Units Subcutaneous TID WC  . insulin aspart protamine- aspart  24 Units Subcutaneous BID WC  . multivitamin with minerals  1 tablet Oral Daily  . pantoprazole  40 mg Oral BID  . potassium chloride SA  20 mEq Oral Daily  . QUEtiapine  200 mg Oral QHS  . sacubitril-valsartan  1 tablet Oral BID  . spironolactone  25 mg Oral Daily    Continuous Infusions:    LOS: 5 days     Alma Friendly, MD [facility]  If 7PM-7AM, please contact night-coverage [email or web address] 06/20/2020, 5:48 PM

## 2020-06-20 NOTE — Progress Notes (Signed)
Patient asking about going home, paged MD , per MD needs to be cleared by cardiology. Pt informed and agreeable with plan.

## 2020-06-20 NOTE — Progress Notes (Signed)
Pt had 23 beats of VT , no symptoms, MD Aware.

## 2020-06-20 NOTE — Progress Notes (Signed)
Paged PA cardiology about VT as well

## 2020-06-20 NOTE — Plan of Care (Signed)
  Problem: Nutrition: Goal: Adequate nutrition will be maintained Outcome: Progressing   Problem: Pain Managment: Goal: General experience of comfort will improve Outcome: Progressing   

## 2020-06-20 NOTE — Progress Notes (Signed)
Advanced Heart Failure Rounding Note  PCP-Cardiologist: [name], MD   Subjective:    Hgb has been stable, 9.7 today. Cr stable at 1.11. Wt today 140.9 kg. Charted net negative 12 L. Denies any new melena/hematochezia. Has been up and walking. Feels like he is improving significantly.  Objective:   Weight Range: (!) 140.9 kg Body mass index is 39.89 kg/m.   Vital Signs:   Temp:  [98.1 F (36.7 C)-98.9 F (37.2 C)] 98.9 F (37.2 C) (07/25 1153) Pulse Rate:  [56-77] 74 (07/25 1153) Resp:  [18-20] 20 (07/25 0843) BP: (110-146)/(74-89) 110/84 (07/25 1153) SpO2:  [94 %-98 %] 94 % (07/25 1153) Weight:  [140.9 kg] 140.9 kg (07/25 0501) Last BM Date: 06/19/20  Weight change: Filed Weights   06/18/20 0432 06/19/20 0157 06/20/20 0501  Weight: (!) 143.2 kg (!) 142.7 kg (!) 140.9 kg    Intake/Output:   Intake/Output Summary (Last 24 hours) at 06/20/2020 1554 Last data filed at 06/20/2020 1300 Gross per 24 hour  Intake 1040 ml  Output 2550 ml  Net -1510 ml      Physical Exam    GEN: Well nourished, well developed in no acute distress NECK: No JVD visualized but difficult body habitus CARDIAC: regular rhythm, normal S1 and S2, no rubs or gallops. No murmur. VASCULAR: Radial pulses 2+ bilaterally.  RESPIRATORY:  Clear to auscultation without rales, wheezing or rhonchi  ABDOMEN: Soft, non-tender, non-distended MUSCULOSKELETAL:  Moves all 4 limbs independently SKIN: Warm and dry, no significant LE edema NEUROLOGIC:  No focal neuro deficits noted. PSYCHIATRIC:  Normal affect    Telemetry   Largely sinus rhythm with occasional PVCs. Had a six second episode this AM that telemetry reads as VT--only single lead picking up rhythm, cannot confirm if VT or motion artifact, but concerning.   EKG    No new EKG to review today   Labs    CBC Recent Labs    06/19/20 0720 06/20/20 0622  WBC 7.4 7.1  NEUTROABS 5.0 4.5  HGB 9.5* 9.7*  HCT 34.6* 36.1*  MCV 81.6 82.6   PLT 262 428   Basic Metabolic Panel Recent Labs    06/19/20 0720 06/20/20 0622  NA 141 141  K 3.9 4.2  CL 100 100  CO2 31 32  GLUCOSE 132* 129*  BUN 13 13  CREATININE 1.04 1.11  CALCIUM 8.7* 8.7*   Liver Function Tests No results for input(s): AST, ALT, ALKPHOS, BILITOT, PROT, ALBUMIN in the last 72 hours. No results for input(s): LIPASE, AMYLASE in the last 72 hours. Cardiac Enzymes No results for input(s): CKTOTAL, CKMB, CKMBINDEX, TROPONINI in the last 72 hours.  BNP: BNP (last 3 results) Recent Labs    12/23/19 1113 06/10/20 1206 06/15/20 1205  BNP 1,322.6* 1,367.9* 1,620.5*    ProBNP (last 3 results) Recent Labs    11/03/19 1647 03/15/20 0832  PROBNP 6,177* 231*     D-Dimer No results for input(s): DDIMER in the last 72 hours. Hemoglobin A1C No results for input(s): HGBA1C in the last 72 hours. Fasting Lipid Panel No results for input(s): CHOL, HDL, LDLCALC, TRIG, CHOLHDL, LDLDIRECT in the last 72 hours. Thyroid Function Tests No results for input(s): TSH, T4TOTAL, T3FREE, THYROIDAB in the last 72 hours.  Invalid input(s): FREET3  Other results:   Imaging    No results found.   Medications:     Scheduled Medications: . apixaban  5 mg Oral BID  . atorvastatin  40 mg Oral q1800  .  carvedilol  6.25 mg Oral BID WC  . ferrous sulfate  325 mg Oral BID WC  . furosemide  40 mg Oral Daily  . furosemide  80 mg Oral Daily  . gabapentin  600 mg Oral BID  . insulin aspart  0-9 Units Subcutaneous TID WC  . insulin aspart protamine- aspart  24 Units Subcutaneous BID WC  . multivitamin with minerals  1 tablet Oral Daily  . pantoprazole  40 mg Oral BID  . potassium chloride SA  20 mEq Oral Daily  . QUEtiapine  200 mg Oral QHS  . sacubitril-valsartan  1 tablet Oral BID  . spironolactone  25 mg Oral Daily    Infusions:   PRN Medications: acetaminophen, albuterol, naphazoline-glycerin   Assessment/Plan   Acute on Chronic Systolic Heart  Failure - Echo in 11/20 with EF 25-30%. Repeat Echo 5/21 EF 30% with mildly decreased RV systolic function. Suspect mixed ischemic/nonischemic cardiomyopathy.  -admitted with volume overload, AKI, found to have anemia. Now net negative 12 L, Cr stable and normal. - Continue Entresto 49-51 mg BID - Continue Spiro 25 mg daily  -was on carvedilol 6.25 mg BID this admission, will increase to home dose of carvedilol 12.5 mg BID given possible NSVT this AM (versus artifact) - Continue PO Lasix 80 qam/40 qpm -if he tolerates carvedilol increase and Hgb stable, suspect he will be ready for discharge tomorrow. Monitor telemetry  Anemia Due to GIB:  - on chronic a/c w/ Eliquis for PAF - now readmitted for recurrent anemia, Hgb 5.4 on admit, FOBT +.  Recent melena. Denies ETOH/ NSAID use - s/p transfusion x 3 units. Hgb improved and stablized - EGD 7/22 showed multiple nonbleeding AVMs involving the second and third portion of the duodenum successfully treated with APC.  -tolerating restart of apixaban, continue - Continue FeSO4 to 325 mg bid per GI - continue Protonix 40 mg bid  CAD:  -no chest pain -Last cath in 11/20 with occluded OM2 stent, 90% D1, severe diffuse disease in the RCA (no intervention).  -No ASA given apixaban use/ recurrent GIBs.  -Continue atorvastatin. Good lipids in 1/21.   PAF:   -remains in NSR, tolerating restart of apixaban -given recurrent GIBs w/ severe anemia he will be consider for Watchman device. EP aware and will see as outpatient   DMII:  -insulin per primary team   -consider SGLT2i given CAD, cardiomyopathy  OSA:  - CPAP QHS   Length of Stay: 5  Buford Dresser, MD  06/20/2020, 3:54 PM  Advanced Heart Failure Team Pager [phone] (M-F; 7a - 4p)  Please contact [facility] for night-coverage after hours (4p -7a ) and weekends on [email or web address]

## 2020-06-21 LAB — BASIC METABOLIC PANEL
Anion gap: 7 (ref 5–15)
BUN: 16 mg/dL (ref 6–20)
CO2: 32 mmol/L (ref 22–32)
Calcium: 8.5 mg/dL — ABNORMAL LOW (ref 8.9–10.3)
Chloride: 101 mmol/L (ref 98–111)
Creatinine, Ser: 1.26 mg/dL — ABNORMAL HIGH (ref 0.61–1.24)
GFR calc Af Amer: >60 mL/min (ref >60)
GFR calc non Af Amer: >60 mL/min (ref >60)
Glucose, Bld: 136 mg/dL — ABNORMAL HIGH (ref 70–99)
Potassium: 4.1 mmol/L (ref 3.5–5.1)
Sodium: 140 mmol/L (ref 135–145)

## 2020-06-21 LAB — GLUCOSE, CAPILLARY
Glucose-Capillary: 134 mg/dL — ABNORMAL HIGH (ref 70–99)
Glucose-Capillary: 109 mg/dL — ABNORMAL HIGH (ref 70–99)

## 2020-06-21 LAB — CBC WITH DIFFERENTIAL/PLATELET
Abs Immature Granulocytes: 0.02 10*3/uL (ref 0.00–0.07)
Basophils Absolute: 0 10*3/uL (ref 0.0–0.1)
Basophils Relative: 1 %
Eosinophils Absolute: 0.3 10*3/uL (ref 0.0–0.5)
Eosinophils Relative: 5 %
HCT: 34 % — ABNORMAL LOW (ref 39.0–52.0)
Hemoglobin: 9.3 g/dL — ABNORMAL LOW (ref 13.0–17.0)
Immature Granulocytes: 0 %
Lymphocytes Relative: 19 %
Lymphs Abs: 1.2 10*3/uL (ref 0.7–4.0)
MCH: 22.3 pg — ABNORMAL LOW (ref 26.0–34.0)
MCHC: 27.4 g/dL — ABNORMAL LOW (ref 30.0–36.0)
MCV: 81.5 fL (ref 80.0–100.0)
Monocytes Absolute: 0.8 10*3/uL (ref 0.1–1.0)
Monocytes Relative: 12 %
Neutro Abs: 4.1 10*3/uL (ref 1.7–7.7)
Neutrophils Relative %: 63 %
Platelets: 219 10*3/uL (ref 150–400)
RBC: 4.17 MIL/uL — ABNORMAL LOW (ref 4.22–5.81)
RDW: 17.1 % — ABNORMAL HIGH (ref 11.5–15.5)
WBC: 6.5 10*3/uL (ref 4.0–10.5)
nRBC: 0 % (ref 0.0–0.2)

## 2020-06-21 MED ORDER — SACUBITRIL-VALSARTAN 49-51 MG PO TABS: 1.0000 | ORAL_TABLET | Freq: Two times a day (BID) | ORAL | 0 refills | Status: DC

## 2020-06-21 MED FILL — ENTRESTO 49 MG-51 MG TABLET: 49-51 | 30 days supply | Qty: 60 | Fill #0

## 2020-06-21 NOTE — Discharge Summary (Signed)
Discharge Summary  [name] DJT:[id number] DOB: 1961/04/21  PCP: Nolene Ebbs, MD  Admit date: 06/15/2020 Discharge date: 06/21/2020  Time spent: 40 mins  Recommendations for Outpatient Follow-up:  1. PCP in 1 week 2. Heart failure/cardiology clinic as scheduled    Discharge Diagnoses:  Active Hospital Problems   Diagnosis Date Noted  . CHF exacerbation ([place]) 06/15/2020  . Acute blood loss anemia   . Angiodysplasia of stomach   . Chronic anticoagulation   . AKI (acute kidney injury) ([place]) 06/15/2020  . CKD (chronic kidney disease), stage III 06/15/2020  . Anemia due to chronic blood loss   . Angiodysplasia of small intestine   . OSA on CPAP 04/05/2020  . PAF (paroxysmal atrial fibrillation) ([place]) 04/05/2020  . Diabetic polyneuropathy associated with type 2 diabetes mellitus ([place]) 12/03/2019  . Type 2 diabetes mellitus with hyperglycemia, with long-term current use of insulin ([place]) 12/03/2019  . GAD (generalized anxiety disorder) 01/26/2015    Resolved Hospital Problems  No resolved problems to display.    Discharge Condition: Stable  Diet recommendation: Heart healthy/moderate carb  Vitals:   06/21/20 0822 06/21/20 1153  BP: 126/84 (!) 142/85  Pulse: 75 73  Resp:  18  Temp:  98.6 F (37 C)  SpO2: 100% 92%    History of present illness:  [name] a 59 y.o.malewith history h/ohypertension, CAD, CHF with low EF 30%, DM, depression/schizophrenia who is closely followed by heart failure team as outpatient presents with complaints of persistent leg swellings and dyspnea. Patient was seen in heart failure clinic on July 15 and noted to have volume overload, received 80 mg IV Lasix in the clinic with potassium and was being monitored closely as outpatient. He was directed to present to the ED today after HF team noted persistent decompensated state and concern for worsening leg/pulmonary edema.Patient states he has been feeling "congested"  in his chest and face over the last week. He states he was building up fluid everywhere in his body which prompted him to come to the ED. He uses CPAP at night for sleep apnea but not on home O2. Denies any chest pain or productive cough. In the ED, VSS except for afebrile, pulse ox on arrival 94% on room air but patient noted to be dropping sats to 70% at times while in the ED. Placed on 4 L O2and sats now at 97- 100%. Lab work-up revealed hemoglobin 5.4, BUN 40, creatinine 2.0 (baseline appears to be around 0.9 -1.3). BNP at 1620. Troponin Ielevated at 55.Patient received 80 mg IV Lasix in the ED and has orders for blood transfusion.  Patient admitted for further management.    Today, patient denies any new complaints, denies any chest pain, worsening shortness of breath, abdominal pain, nausea/vomiting, fever/chills.  Advised to follow-up with PCP and cardiology as scheduled.    Hospital Course:  Principal Problem:   CHF exacerbation ([place]) Active Problems:   GAD (generalized anxiety disorder)   Type 2 diabetes mellitus with hyperglycemia, with long-term current use of insulin (H[facility])   Diabetic polyneuropathy associated with type 2 diabetes mellitus ([facility])   PAF (paroxysmal atrial fibrillation) ([facility])   OSA on CPAP   Anemia due to chronic blood loss   Angiodysplasia of small intestine   AKI (acute kidney injury) ([place])   CKD (chronic kidney disease), stage III   Acute blood loss anemia   Angiodysplasia of stomach   Chronic anticoagulation   Acute hypoxic respiratory failure likely 2/2 acute on chronic  systolic/diastolic HF Mixed ischemic/nonischemic cardiomyopathy Currently on room air BNP 1600, appeared volume overloaded on admission Chest x-ray with pulmonary edema Echo in May 2021 reviewed EF of 48%, grade 2 diastolic dysfunction Heart failure team on board, continue Lasix, spironolactone, Coreg, Entresto Follow-up with heart failure/cardiology as scheduled  AKI on CKD  stage IIIa Stable Baseline creatinine around 0.9-1.3, on presentation Cr 2 Renal ultrasound unremarkable Follow-up with heart failure clinic with repeat labs  Upper GI bleed, recurrent FOBT positive History of AVMs stomach, duodenum via small bowel enteroscopy in 03/2020, treated with APC Restarted Eliquis (cardiology considering watchman's procedure for patient), continue Protonix GI consulted, s/p small bowel endoscopy, which showed multiple nonbleeding angiodysplastic lesions in the second and third portion of the duodenum successfully treated with APC Follow-up with PCP  Acute on chronic blood loss anemia Likely 2/2 above Hemoglobin dropped to 5.6, status post 3 units of PRBC Anemia panel showed iron 9, sats 2, ferritin 13, folate 38.6, vitamin B12 852 Gave Feraheme on 06/16/2020, continue PO iron supplementation Follow-up with PCP  Elevated troponin/CAD Chest pain-free Troponin 55-73 Likely from demand ischemia from severe anemia and volume overload Continue Lipitor, not on aspirin given Eliquis use/recurrent GI bleeds Monitor closely  Paroxysmal A. Fib Rate controlled Continue Eliquis Continue Coreg  Diabetes mellitus type 2/with neuropathy A1c done on 04/16/2020 showed 6.5 Continue home regimen Continue Neurontin  Obstructive sleep apnea Continue CPAP  Anxiety/depression Continue Seroquel  Morbid obesity Lifestyle modification advised          Malnutrition Type:      Malnutrition Characteristics:      Nutrition Interventions:      Estimated body mass index is 39.83 kg/m as calculated from the following:   Height as of this encounter: '6\' 2"'  (1.88 m).   Weight as of this encounter: 140.7 kg.    Procedures:  Small bowel enteroscopy on 06/17/2020  Consultations:  GI  Heart failure/cardiology  Discharge Exam: BP (!) 142/85 (BP Location: Right Arm)   Pulse 73   Temp 98.6 F (37 C) (Oral)   Resp 18   Ht '6\' 2"'  (1.88 m)   Wt  (!) 140.7 kg   SpO2 92%   BMI 39.83 kg/m   General: NAD Cardiovascular: S1, S2 present Respiratory: CTA B     Discharge Instructions You were cared for by a hospitalist during your hospital stay. If you have any questions about your discharge medications or the care you received while you were in the hospital after you are discharged, you can call the unit and asked to speak with the hospitalist on call if the hospitalist that took care of you is not available. Once you are discharged, your primary care physician will handle any further medical issues. Please note that NO REFILLS for any discharge medications will be authorized once you are discharged, as it is imperative that you return to your primary care physician (or establish a relationship with a primary care physician if you do not have one) for your aftercare needs so that they can reassess your need for medications and monitor your lab values.  Discharge Instructions    Diet - low sodium heart healthy   Complete by: As directed    Increase activity slowly   Complete by: As directed      Allergies as of 06/21/2020   No Known Allergies     Medication List    STOP taking these medications   cyclobenzaprine 10 MG tablet Commonly known as: FLEXERIL  Entresto 24-26 MG Generic drug: sacubitril-valsartan   sacubitril-valsartan 97-103 MG Commonly known as: ENTRESTO Replaced by: sacubitril-valsartan 49-51 MG     TAKE these medications   acetaminophen 500 MG tablet Commonly known as: TYLENOL Take 500 mg by mouth every 6 (six) hours as needed for mild pain or moderate pain.   apixaban 5 MG Tabs tablet Commonly known as: ELIQUIS Take 1 tablet (5 mg total) by mouth 2 (two) times daily.   atorvastatin 40 MG tablet Commonly known as: LIPITOR Take 1 tablet (40 mg total) by mouth daily at 6 PM.   blood glucose meter kit and supplies Kit Dispense based on patient and insurance preference. Use up to four times daily as  directed. (FOR ICD-9 250.00, 250.01). What changed:   how much to take  how to take this  when to take this   buPROPion 100 MG 12 hr tablet Commonly known as: Wellbutrin SR Take 1 tablet (100 mg total) by mouth 2 (two) times daily.   carvedilol 12.5 MG tablet Commonly known as: COREG Take 1 tablet (12.5 mg total) by mouth 2 (two) times daily with a meal.   Centrum Silver 50+Men Tabs Take 1 tablet by mouth daily.   ferrous sulfate 325 (65 FE) MG tablet Take 1 tablet (325 mg total) by mouth daily with breakfast.   furosemide 40 MG tablet Commonly known as: LASIX Take furosemide 80 mg (2 tablets) every morning and 40 mg (1 tablet) every evening.   gabapentin 300 MG capsule Commonly known as: NEURONTIN Take 2 capsules (600 mg = total), by mouth, 2 times daily. What changed:   how much to take  how to take this  when to take this  additional instructions   Insulin Lispro Prot & Lispro (75-25) 100 UNIT/ML Kwikpen Commonly known as: HumaLOG Mix 75/25 KwikPen Inject 30 Units into the skin 2 (two) times daily.   pantoprazole 40 MG tablet Commonly known as: PROTONIX TAKE 1 TABLET (40 MG TOTAL) BY MOUTH 2 (TWO) TIMES DAILY.   potassium chloride SA 20 MEQ tablet Commonly known as: Klor-Con M20 Take 1 tablet (20 mEq total) by mouth daily.   QUEtiapine 200 MG tablet Commonly known as: SEROQUEL Take 200 mg by mouth at bedtime.   sacubitril-valsartan 49-51 MG Commonly known as: ENTRESTO Take 1 tablet by mouth 2 (two) times daily. Replaces: sacubitril-valsartan 97-103 MG   sildenafil 100 MG tablet Commonly known as: Viagra Take 1 tablet (100 mg total) by mouth daily as needed for erectile dysfunction.   spironolactone 25 MG tablet Commonly known as: ALDACTONE Take 1 tablet (25 mg total) by mouth daily.   tetrahydrozoline 0.05 % ophthalmic solution Place 1 drop into both eyes daily.   TRUEplus Pen Needles 31G X 6 MM Misc Generic drug: Insulin Pen Needle USE AS  DIRECTED      No Known Allergies  Follow-up Information    [facility] Follow up on 06/24/2020.   Specialty: Cardiology Why: 2:00 PM  Advanced Heart Failure Clinic  Contact information: [address] [id number] [place]       Nolene Ebbs, MD. Schedule an appointment as soon as possible for a visit in 1 week(s).   Specialty: Internal Medicine Contact information: [place] [address] [phone]        [name], MD .   Specialty: Cardiology Contact information: [address] [place] [address] [phone]  [name], MD .   Specialty: Cardiology Contact information: [place] [address] [phone]                The results of significant diagnostics from this hospitalization (including imaging, microbiology, ancillary and laboratory) are listed below for reference.    Significant Diagnostic Studies: DG Chest 1 View  Result Date: 06/16/2020 CLINICAL DATA:  Respiratory failure EXAM: CHEST  1 VIEW COMPARISON:  06/15/2020 FINDINGS: Frontal view of the chest demonstrates persistent enlargement of the cardiac silhouette. There is persistent central vascular congestion, with mild increased interstitial prominence consistent with interstitial edema. No effusion or pneumothorax. No focal consolidation. IMPRESSION: 1. Slight progression of interstitial edema. Electronically Signed   By: Randa Ngo M.D.   On: 06/16/2020 03:37   US RENAL  Result Date: 06/15/2020 CLINICAL DATA:  Hypertension, diabetes. EXAM: RENAL / URINARY TRACT ULTRASOUND COMPLETE COMPARISON:  None. FINDINGS: Right Kidney: Renal measurements: 12.2 x 4.7 x 6.2 cm = volume: 185.9 mL . Echogenicity within normal limits. No mass or hydronephrosis visualized. Left Kidney: Renal measurements: 13.8 x 7.0 x 6.8 cm = volume: 345.9 mL.  Echogenicity within normal limits. No mass or hydronephrosis visualized. Bladder: Appears normal for degree of bladder distention. Other: None. IMPRESSION: Normal renal ultrasound. Electronically Signed   By: Fidela Salisbury M.D.   On: 06/15/2020 16:35   DG Chest Portable 1 View  Result Date: 06/15/2020 CLINICAL DATA:  Generalized edema EXAM: PORTABLE CHEST 1 VIEW COMPARISON:  Apr 06, 2020 FINDINGS: The cardiomediastinal silhouette is unchanged and enlarged in contour. No significant pleural effusion. No pneumothorax. No acute pleuroparenchymal abnormality. Unchanged perihilar vascular prominence. Mild diffuse interstitial prominence and peribronchial cuffing. Visualized abdomen is unremarkable. Multilevel degenerative changes of the thoracic spine. IMPRESSION: Constellation of findings are favored to reflect mild pulmonary edema. Electronically Signed   By: Valentino Saxon MD   On: 06/15/2020 13:05    Microbiology: Recent Results (from the past 240 hour(s))  SARS Coronavirus 2 by RT PCR (hospital order, performed in [facility] hospital lab) Nasopharyngeal Nasopharyngeal Swab     Status: None   Collection Time: 06/15/20 12:06 PM   Specimen: Nasopharyngeal Swab  Result Value Ref Range Status   SARS Coronavirus 2 NEGATIVE NEGATIVE Final    Comment: (NOTE) SARS-CoV-2 target nucleic acids are NOT DETECTED.  The SARS-CoV-2 RNA is generally detectable in upper and lower respiratory specimens during the acute phase of infection. The lowest concentration of SARS-CoV-2 viral copies this assay can detect is 250 copies / mL. A negative result does not preclude SARS-CoV-2 infection and should not be used as the sole basis for treatment or other patient management decisions.  A negative result may occur with improper specimen collection / handling, submission of specimen other than nasopharyngeal swab, presence of viral mutation(s) within the areas targeted by this assay, and inadequate number of  viral copies (<250 copies / mL). A negative result must be combined with clinical observations, patient history, and epidemiological information.  Fact Sheet for Patients:   StrictlyIdeas.no  Fact Sheet for Healthcare Providers: BankingDealers.co.za  This test is not yet approved or  cleared by the [place] FDA and has been authorized for detection and/or diagnosis of SARS-CoV-2 by FDA under an Emergency Use Authorization (EUA).  This EUA will remain in effect (meaning this test can be used) for the duration of the COVID-19 declaration under Section 564(b)(1) of the Act, 21 U.S.C. section 360bbb-3(b)(1), unless the authorization is terminated or  revoked sooner.  Performed at [facility], [place] [address], [place], [place] [address]      Labs: Basic Metabolic Panel: Recent Labs  Lab 06/15/20 1205 06/15/20 1241 06/17/20 1149 06/18/20 0805 06/19/20 0720 06/20/20 0622 06/21/20 0613  NA 139   < > 140 142 141 141 140  K 4.2   < > 4.1 3.7 3.9 4.2 4.1  CL 101   < > 99 98 100 100 101  CO2 29   < > 33* 35* 31 32 32  GLUCOSE 158*   < > 141* 119* 132* 129* 136*  BUN 40*   < > '17 14 13 13 16  ' CREATININE 2.00*   < > 1.16 1.12 1.04 1.11 1.26*  CALCIUM 8.1*   < > 8.6* 8.5* 8.7* 8.7* 8.5*  MG 2.1  --   --   --   --   --   --    < > = values in this interval not displayed.   Liver Function Tests: No results for input(s): AST, ALT, ALKPHOS, BILITOT, PROT, ALBUMIN in the last 168 hours. No results for input(s): LIPASE, AMYLASE in the last 168 hours. No results for input(s): AMMONIA in the last 168 hours. CBC: Recent Labs  Lab 06/17/20 1149 06/18/20 0805 06/19/20 0720 06/20/20 0622 06/21/20 0613  WBC 8.1 8.2 7.4 7.1 6.5  NEUTROABS 5.6 6.6 5.0 4.5 4.1  HGB 9.3* 9.4* 9.5* 9.7* 9.3*  HCT 33.5* 33.9* 34.6* 36.1* 34.0*  MCV 81.7 83.3 81.6 82.6 81.5  PLT 260 220 262 227 219   Cardiac Enzymes: No results for input(s):  CKTOTAL, CKMB, CKMBINDEX, TROPONINI in the last 168 hours. BNP: BNP (last 3 results) Recent Labs    12/23/19 1113 06/10/20 1206 06/15/20 1205  BNP 1,322.6* 1,367.9* 1,620.5*    ProBNP (last 3 results) Recent Labs    11/03/19 1647 03/15/20 0832  PROBNP 6,177* 231*    CBG: Recent Labs  Lab 06/20/20 1101 06/20/20 1648 06/20/20 2052 06/21/20 0556 06/21/20 1151  GLUCAP 196* 235* 204* 134* 109*       Signed:  Alma Friendly, MD [facility] 06/21/2020, 2:52 PM

## 2020-06-21 NOTE — Progress Notes (Signed)
Pt has orders to be discharged. Discharge instructions given and pt has no additional questions at this time. Medication regimen reviewed and pt educated. Pt verbalized understanding and has no additional questions. Telemetry box removed. IV removed and site in good condition. Pt stable and waiting for transportation. 

## 2020-06-21 NOTE — Care Management Important Message (Signed)
Important Message  Patient Details  Name: [name] MRN: [id number] Date of Birth: [birth year]   Medicare Important Message Given:  Yes     [name] 06/21/2020, 9:02 AM

## 2020-06-21 NOTE — Progress Notes (Addendum)
Advanced Heart Failure Rounding Note  PCP-Cardiologist: [name], MD   Subjective:    Received total of 3 units of blood this admit.   Hgb improved and stable, 5.6>>7.3>>8.4>>9.3>>9.4>>9.5>>9.7>>9.3.   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 Otherwise unremarkable exam. Back on Eliquis w/ stable hgb. No further melena.   SCr overall improved. SCr 2.0 on admit and was down to 1.11 yesterday. Slight bump overnight, SCr now 1.26. PO diuretic order for this am was discontinued. Wt stable past 24 hs at 310 lb. He denies dyspnea.   Has ?NSVT yesterday and Coreg dose was further titrated to 12.5 mg bid. No NSVT on tele today. BP well controlled.    Objective:   Weight Range: (!) 140.7 kg Body mass index is 39.83 kg/m.   Vital Signs:   Temp:  [98.4 F (36.9 C)-98.9 F (37.2 C)] 98.4 F (36.9 C) (07/26 0320) Pulse Rate:  [60-83] 75 (07/26 0822) Resp:  [15-20] 15 (07/26 0320) BP: (110-145)/(63-88) 126/84 (07/26 0822) SpO2:  [93 %-100 %] 100 % (07/26 0822) Weight:  [140.7 kg] 140.7 kg (07/26 0320) Last BM Date: 06/20/20  Weight change: Filed Weights   06/19/20 0157 06/20/20 0501 06/21/20 0320  Weight: (!) 142.7 kg (!) 140.9 kg (!) 140.7 kg    Intake/Output:   Intake/Output Summary (Last 24 hours) at 06/21/2020 1045 Last data filed at 06/21/2020 0900 Gross per 24 hour  Intake 1400 ml  Output 2050 ml  Net -650 ml      Physical Exam    General: obese, NAD HEENT: Normal Neck: Supple. Thick neck, JVP assessment difficult . Carotids 2+ bilat; no bruits. No lymphadenopathy or thyromegaly appreciated. Cor: PMI nondisplaced. Regular rate & rhythm. No rubs, gallops or murmurs. Lungs: expiratory wheezing at the bases bilaterally. No crackles   Abdomen: obese but soft, nontender, nondistended. No hepatosplenomegaly. No bruits or masses. Good bowel sounds. Extremities: No  cyanosis, clubbing, rash, edema Neuro: Alert & orientedx3, cranial nerves grossly intact. moves all 4 extremities w/o difficulty. Affect pleasant   Telemetry   NSR 70s. No NSVT/ VT   EKG    No new EKG to review today   Labs    CBC Recent Labs    06/20/20 0622 06/21/20 0613  WBC 7.1 6.5  NEUTROABS 4.5 4.1  HGB 9.7* 9.3*  HCT 36.1* 34.0*  MCV 82.6 81.5  PLT 227 376   Basic Metabolic Panel Recent Labs    06/20/20 0622 06/21/20 0613  NA 141 140  K 4.2 4.1  CL 100 101  CO2 32 32  GLUCOSE 129* 136*  BUN 13 16  CREATININE 1.11 1.26*  CALCIUM 8.7* 8.5*   Liver Function Tests No results for input(s): AST, ALT, ALKPHOS, BILITOT, PROT, ALBUMIN in the last 72 hours. No results for input(s): LIPASE, AMYLASE in the last 72 hours. Cardiac Enzymes No results for input(s): CKTOTAL, CKMB, CKMBINDEX, TROPONINI in the last 72 hours.  BNP: BNP (last 3 results) Recent Labs    12/23/19 1113 06/10/20 1206 06/15/20 1205  BNP 1,322.6* 1,367.9* 1,620.5*    ProBNP (last 3 results) Recent Labs    11/03/19 1647 03/15/20 0832  PROBNP 6,177* 231*     D-Dimer No results for input(s): DDIMER in the last 72 hours. Hemoglobin A1C No results for input(s): HGBA1C in the last 72 hours. Fasting Lipid Panel No results for input(s): CHOL, HDL, LDLCALC, TRIG, CHOLHDL, LDLDIRECT in the  last 72 hours. Thyroid Function Tests No results for input(s): TSH, T4TOTAL, T3FREE, THYROIDAB in the last 72 hours.  Invalid input(s): FREET3  Other results:   Imaging    No results found.   Medications:     Scheduled Medications: . apixaban  5 mg Oral BID  . atorvastatin  40 mg Oral q1800  . carvedilol  12.5 mg Oral BID WC  . ferrous sulfate  325 mg Oral BID WC  . gabapentin  600 mg Oral BID  . insulin aspart  0-9 Units Subcutaneous TID WC  . insulin aspart protamine- aspart  24 Units Subcutaneous BID WC  . multivitamin with minerals  1 tablet Oral Daily  . pantoprazole  40 mg  Oral BID  . potassium chloride SA  20 mEq Oral Daily  . QUEtiapine  200 mg Oral QHS  . sacubitril-valsartan  1 tablet Oral BID  . spironolactone  25 mg Oral Daily    Infusions:   PRN Medications: acetaminophen, albuterol, naphazoline-glycerin   Assessment/Plan   1. Acute on Chronic Systolic Heart Failure - Echo in 11/20 with EF 25-30%. Repeat Echo 5/21 EF 30% with mildly decreased RV systolic function. Suspect mixed ischemic/nonischemic cardiomyopathy.  - Admitted w/ volume overload - volume status much improved w/ IV Lasix, I/Os net negative 13L.  - Continue Entresto 49/51 bid - Continue Spiro 25 mg daily  - Continue Coreg 12.5 mg bid - Hold PO Lasix today w/ slight bump in SCr. Resume 80 mg qam/ 40 mg qpm 7/27  2. Anemia Due to GIB:  - recent admit 03/2020 for anemia 2/2 GIB. EGD 5/13 showed 2 bleedingangioectasias in the stomach+ 4 non-bleedingangiodysplastic lesions in the duodenum, all treated w/ APC - on chronic a/c w/ Eliquis for PAF - now readmitted for recurrent anemia, Hgb 5.4 on admit, FOBT +.  Recent melena. Denies ETOH/ NSAID use - s/p transfusion x 3 units. 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 Otherwise unremarkable exam. Appreciate GI's assistance. - Back on Eliquis w/ no further melena. H/H stable  - Continue FeSO4 to 325 mg bid per GI - continue Protonix 40 mg bid  3. AKI - Admit SCr 2.0 (baseline ~1.0) - improved w/ diuresis but slight bump overnight, 1.1>>1.4 - Hold PO diuretics today. Resume home regimen tomorrow, Lasix 80/40  - will get repeat outpatient BMP in our clinic later this week    4. CAD:  - Last cath in 11/20 with occluded OM2 stent, 90% D1, severe diffuse disease in the RCA (no intervention).  - denies CP.  Hs troponin 55>>73, suspect demand ischemia from severe anemia and volume overload.  - no plan for  ischemic w/u. - No ASA given apixaban use/ recurrent GIBs.  - Continue atorvastatin. Good lipids in 1/21.   5. PAF:   - in NSR currently.  - on Eliquis 5 mg bid - given recurrent GIBs w/ severe anemia he will be consider for Watchman device. EP aware and will see as outpatient   6. DMII:  - insulin per primary team    7. OSA:  - CPAP QHS   Ok for d/c home today for CHF standpoint. We will arrange f/u and will place appt info in AVS  Cardiac Meds for Discharge  Eliquis 5 mg bid  Atorvastatin 40 mg daily  Coreg 12.5 mg bid Entresto 49-51 mg bid Spironolactone 25 mg daily  Lasix 80 mg qam/ 40 mg qpm (starting tomorrow, 7/27)  Length of Stay: [address] PA-C  06/21/2020, 10:45 AM  Advanced Heart Failure Team Pager [phone] (M-F; [place])  Please contact [facility] for night-coverage after hours (4p -7a ) and weekends on [email or web address]  Patient seen with PA, agree with the above note.   Hgb stable, creatinine basically stable at 1.26 (not significantly elevated).  Volume status looks ok.   He is ready to go home on the above regimen of cardiac medications.  Will see in CHF clinic in followup.   Loralie Champagne 06/21/2020

## 2020-06-22 ENCOUNTER — Other Ambulatory Visit (HOSPITAL_COMMUNITY): Payer: Self-pay

## 2020-06-22 NOTE — Progress Notes (Signed)
Paramedicine Encounter    Patient ID: [name], male    DOB: Nov 08, 1961, 58 y.o.   MRN: [id number]   Patient Care Team: Nolene Ebbs, MD as PCP - General (Internal Medicine) [name], MD as PCP - Cardiology (Cardiology) [name], MD as PCP - Advanced Heart Failure (Cardiology)  Patient Active Problem List   Diagnosis Date Noted   Acute blood loss anemia    Angiodysplasia of stomach    Chronic anticoagulation    CHF exacerbation ([place]) 06/15/2020   AKI (acute kidney injury) ([place]) 06/15/2020   CKD (chronic kidney disease), stage III 06/15/2020   Anemia due to chronic blood loss    Gastric AVM    Angiodysplasia of small intestine    Acute on chronic systolic (congestive) heart failure ([place]) 04/05/2020   Anemia 04/05/2020   PAF (paroxysmal atrial fibrillation) ([place]) 04/05/2020   OSA on CPAP 04/05/2020   Syncope and collapse 04/05/2020   Type 2 diabetes mellitus with hyperglycemia, with long-term current use of insulin ([place]) 12/03/2019   Diabetic polyneuropathy associated with type 2 diabetes mellitus ([place]) 12/03/2019   Hyperglycemia 12/03/2019   History of hyperglycemia 12/03/2019   Erectile dysfunction 12/03/2019   CHF (congestive heart failure) ([place]) 10/11/2019   Paranoid schizophrenia, chronic condition ([place]) 01/26/2015   Severe recurrent major depressive disorder with psychotic features ([place]) 01/26/2015   GAD (generalized anxiety disorder) 01/26/2015   OCD (obsessive compulsive disorder) 01/26/2015   Panic disorder without agoraphobia 01/26/2015   Insomnia 01/26/2015    Current Outpatient Medications:    acetaminophen (TYLENOL) 500 MG tablet, Take 500 mg by mouth every 6 (six) hours as needed for mild pain or moderate pain., Disp: , Rfl:    apixaban (ELIQUIS) 5 MG TABS tablet, Take 1 tablet (5 mg total) by mouth 2 (two) times daily., Disp: 180 tablet, Rfl: 3   atorvastatin (LIPITOR) 40 MG tablet, Take 1 tablet (40 mg  total) by mouth daily at 6 PM., Disp: 90 tablet, Rfl: 3   blood glucose meter kit and supplies KIT, Dispense based on patient and insurance preference. Use up to four times daily as directed. (FOR ICD-9 250.00, 250.01). (Patient taking differently: 1 each by Other route See admin instructions. Dispense based on patient and insurance preference. Use up to four times daily as directed. (FOR ICD-9 250.00, 250.01).), Disp: 1 each, Rfl: 0   buPROPion (WELLBUTRIN SR) 100 MG 12 hr tablet, Take 1 tablet (100 mg total) by mouth 2 (two) times daily. (Patient not taking: Reported on 06/15/2020), Disp: 60 tablet, Rfl: 6   carvedilol (COREG) 12.5 MG tablet, Take 1 tablet (12.5 mg total) by mouth 2 (two) times daily with a meal., Disp: 60 tablet, Rfl: 6   ferrous sulfate 325 (65 FE) MG tablet, Take 1 tablet (325 mg total) by mouth daily with breakfast., Disp: 30 tablet, Rfl: 3   furosemide (LASIX) 40 MG tablet, Take furosemide 80 mg (2 tablets) every morning and 40 mg (1 tablet) every evening., Disp: 90 tablet, Rfl: 11   gabapentin (NEURONTIN) 300 MG capsule, Take 2 capsules (600 mg = total), by mouth, 2 times daily. (Patient taking differently: Take 600 mg by mouth 2 (two) times daily. ), Disp: 120 capsule, Rfl: 3   Insulin Lispro Prot & Lispro (HUMALOG MIX 75/25 KWIKPEN) (75-25) 100 UNIT/ML Kwikpen, Inject 30 Units into the skin 2 (two) times daily., Disp: 15 mL, Rfl: 11   Multiple Vitamins-Minerals (CENTRUM SILVER 50+MEN) TABS, Take 1 tablet by mouth daily.,  Disp: , Rfl:    pantoprazole (PROTONIX) 40 MG tablet, TAKE 1 TABLET (40 MG TOTAL) BY MOUTH 2 (TWO) TIMES DAILY., Disp: 60 tablet, Rfl: 1   potassium chloride SA (KLOR-CON M20) 20 MEQ tablet, Take 1 tablet (20 mEq total) by mouth daily., Disp: 30 tablet, Rfl: 0   QUEtiapine (SEROQUEL) 200 MG tablet, Take 200 mg by mouth at bedtime., Disp: , Rfl:    sacubitril-valsartan (ENTRESTO) 49-51 MG, Take 1 tablet by mouth 2 (two) times daily., Disp: 60 tablet,  Rfl: 0   sildenafil (VIAGRA) 100 MG tablet, Take 1 tablet (100 mg total) by mouth daily as needed for erectile dysfunction., Disp: 10 tablet, Rfl: 3   spironolactone (ALDACTONE) 25 MG tablet, Take 1 tablet (25 mg total) by mouth daily., Disp: 30 tablet, Rfl: 11   tetrahydrozoline 0.05 % ophthalmic solution, Place 1 drop into both eyes daily., Disp: , Rfl:    TRUEPLUS PEN NEEDLES 31G X 6 MM MISC, USE AS DIRECTED, Disp: 100 each, Rfl: 0 No Known Allergies   Social History   Socioeconomic History   Marital status: Legally Separated    Spouse name: Not on file   Number of children: Not on file   Years of education: Not on file   Highest education level: Not on file  Occupational History   Not on file  Tobacco Use   Smoking status: Former Smoker   Smokeless tobacco: Never Used  Scientific laboratory technician Use: Never used  Substance and Sexual Activity   Alcohol use: No   Drug use: No   Sexual activity: Yes  Other Topics Concern   Not on file  Social History Narrative   Not on file   Social Determinants of Health   Financial Resource Strain: Low Risk    Difficulty of Paying Living Expenses: Not very hard  Food Insecurity: No Food Insecurity   Worried About Charity fundraiser in the Last Year: Never true   Arboriculturist in the Last Year: Never true  Transportation Needs: Unmet Transportation Needs   Lack of Transportation (Medical): Yes   Lack of Transportation (Non-Medical): Yes  Physical Activity:    Days of Exercise per Week:    Minutes of Exercise per Session:   Stress:    Feeling of Stress :   Social Connections:    Frequency of Communication with Friends and Family:    Frequency of Social Gatherings with Friends and Family:    Attends Religious Services:    Active Member of Clubs or Organizations:    Attends Archivist Meetings:    Marital Status:   Intimate Partner Violence:    Fear of Current or Ex-Partner:    Emotionally  Abused:    Physically Abused:    Sexually Abused:     Physical Exam Vitals reviewed.  Constitutional:      Appearance: He is normal weight.  HENT:     Head: Normocephalic.     Nose: Nose normal.     Mouth/Throat:     Mouth: Mucous membranes are moist.     Pharynx: Oropharynx is clear.  Cardiovascular:     Rate and Rhythm: Normal rate and regular rhythm.     Pulses: Normal pulses.     Heart sounds: Normal heart sounds.  Pulmonary:     Effort: Pulmonary effort is normal. No respiratory distress.     Breath sounds: Normal breath sounds. No wheezing, rhonchi or rales.  Abdominal:  General: Abdomen is flat.     Palpations: Abdomen is soft.  Musculoskeletal:        General: Normal range of motion.     Cervical back: Normal range of motion.     Right lower leg: No edema.     Left lower leg: No edema.  Skin:    General: Skin is warm and dry.     Capillary Refill: Capillary refill takes less than 2 seconds.  Neurological:     Mental Status: He is alert. Mental status is at baseline.      Arrived for home visit for [name] who was alert and oriented seated on the couch reporting he feels great and much better than last week. Vitals were obtained. Medications were assessed and reviewed. Pill box filled and confirmed. [name] given today's medications. Assessment revealed with no edema, lung sounds clear, no chest pain and no dizziness. [name] made aware of upcoming visit. [name] agreed. Home visit complete.   Refills:  Pantoprazole  Gabapentin Spironolactone Seroquel   CBG- 148   Future Appointments  Date Time Provider [place]  06/24/2020  2:00 PM [facility] PA/NP [facility] None  07/16/2020 10:40 AM Azzie Glatter, FNP [facility] None  07/28/2020  3:00 PM [name], MD [facility] None     ACTION: Home visit completed Next visit planned for one week

## 2020-06-24 ENCOUNTER — Encounter (HOSPITAL_COMMUNITY): Payer: Medicare Other

## 2020-06-28 ENCOUNTER — Other Ambulatory Visit: Payer: Self-pay | Admitting: *Deleted

## 2020-06-28 MED FILL — PANTOPRAZOLE SOD DR 40 MG T: 40 | 30 days supply | Qty: 60 | Fill #0

## 2020-06-28 MED FILL — POTASSIUM CL ER 20 MEQ TABL: 20 | 30 days supply | Qty: 30 | Fill #0

## 2020-06-28 NOTE — Patient Outreach (Signed)
[facility]) Care Management  06/28/2020  [name] 22-Jul-1961 [id number]   RED ON EMMI ALERT - General Discharge Day # 4 Date: 7/31 Red Alert Reason: other problems/questions   Outreach attempt #1, successful however he report this is not a good time to talk.  Request to call this care manager back.   Plan: RN CM will send outreach letter and await call back.  If no call back, will follow up within the next 3-4 business days.  Valente David, [place], MSN [place] [phone]

## 2020-06-29 ENCOUNTER — Other Ambulatory Visit: Payer: Self-pay | Admitting: Family Medicine

## 2020-06-29 ENCOUNTER — Other Ambulatory Visit: Payer: Self-pay

## 2020-06-29 ENCOUNTER — Ambulatory Visit (HOSPITAL_COMMUNITY)
Admission: RE | Admit: 2020-06-29 | Discharge: 2020-06-29 | Disposition: A | Discharge status: Home / Self Care | Payer: Medicare Other | Source: Ambulatory Visit | Attending: Cardiology | Admitting: Cardiology

## 2020-06-29 ENCOUNTER — Encounter (HOSPITAL_COMMUNITY): Payer: Self-pay | Admitting: Cardiology

## 2020-06-29 ENCOUNTER — Other Ambulatory Visit (HOSPITAL_COMMUNITY): Payer: Self-pay

## 2020-06-29 VITALS — BP 122/84 | HR 73 | Ht 74.0 in | Wt 310.8 lb

## 2020-06-29 DIAGNOSIS — Z8249 Family history of ischemic heart disease and other diseases of the circulatory system: Secondary | ICD-10-CM | POA: Diagnosis not present

## 2020-06-29 DIAGNOSIS — F209 Schizophrenia, unspecified: Secondary | ICD-10-CM | POA: Insufficient documentation

## 2020-06-29 DIAGNOSIS — I480 Paroxysmal atrial fibrillation: Secondary | ICD-10-CM | POA: Diagnosis not present

## 2020-06-29 DIAGNOSIS — Z9989 Dependence on other enabling machines and devices: Secondary | ICD-10-CM | POA: Insufficient documentation

## 2020-06-29 DIAGNOSIS — Z79899 Other long term (current) drug therapy: Secondary | ICD-10-CM | POA: Diagnosis not present

## 2020-06-29 DIAGNOSIS — Z7901 Long term (current) use of anticoagulants: Secondary | ICD-10-CM | POA: Insufficient documentation

## 2020-06-29 DIAGNOSIS — I5022 Chronic systolic (congestive) heart failure: Secondary | ICD-10-CM | POA: Diagnosis not present

## 2020-06-29 DIAGNOSIS — Z794 Long term (current) use of insulin: Secondary | ICD-10-CM

## 2020-06-29 DIAGNOSIS — E119 Type 2 diabetes mellitus without complications: Secondary | ICD-10-CM | POA: Diagnosis not present

## 2020-06-29 DIAGNOSIS — G4733 Obstructive sleep apnea (adult) (pediatric): Secondary | ICD-10-CM | POA: Diagnosis not present

## 2020-06-29 DIAGNOSIS — I110 Hypertensive heart disease with heart failure: Secondary | ICD-10-CM | POA: Insufficient documentation

## 2020-06-29 DIAGNOSIS — I2510 Atherosclerotic heart disease of native coronary artery without angina pectoris: Secondary | ICD-10-CM | POA: Insufficient documentation

## 2020-06-29 DIAGNOSIS — Z87891 Personal history of nicotine dependence: Secondary | ICD-10-CM | POA: Diagnosis not present

## 2020-06-29 DIAGNOSIS — E1165 Type 2 diabetes mellitus with hyperglycemia: Secondary | ICD-10-CM

## 2020-06-29 DIAGNOSIS — E785 Hyperlipidemia, unspecified: Secondary | ICD-10-CM | POA: Diagnosis not present

## 2020-06-29 LAB — BASIC METABOLIC PANEL
Anion gap: 7 (ref 5–15)
BUN: 18 mg/dL (ref 6–20)
CO2: 26 mmol/L (ref 22–32)
Calcium: 8.7 mg/dL — ABNORMAL LOW (ref 8.9–10.3)
Chloride: 104 mmol/L (ref 98–111)
Creatinine, Ser: 1.17 mg/dL (ref 0.61–1.24)
GFR calc Af Amer: >60 mL/min (ref >60)
GFR calc non Af Amer: >60 mL/min (ref >60)
Glucose, Bld: 202 mg/dL — ABNORMAL HIGH (ref 70–99)
Potassium: 4.3 mmol/L (ref 3.5–5.1)
Sodium: 137 mmol/L (ref 135–145)

## 2020-06-29 LAB — CBC
HCT: 40.1 % (ref 39.0–52.0)
Hemoglobin: 11.1 g/dL — ABNORMAL LOW (ref 13.0–17.0)
MCH: 23.1 pg — ABNORMAL LOW (ref 26.0–34.0)
MCHC: 27.7 g/dL — ABNORMAL LOW (ref 30.0–36.0)
MCV: 83.5 fL (ref 80.0–100.0)
Platelets: 437 10*3/uL — ABNORMAL HIGH (ref 150–400)
RBC: 4.8 MIL/uL (ref 4.22–5.81)
RDW: 18.3 % — ABNORMAL HIGH (ref 11.5–15.5)
WBC: 5.7 10*3/uL (ref 4.0–10.5)
nRBC: 0 % (ref 0.0–0.2)

## 2020-06-29 MED ORDER — ENTRESTO 97-103 MG PO TABS: 1.0000 | ORAL_TABLET | Freq: Two times a day (BID) | ORAL | 5 refills | Status: DC

## 2020-06-29 MED ORDER — FUROSEMIDE 40 MG PO TABS: 80.0000 mg | ORAL_TABLET | Freq: Two times a day (BID) | ORAL | 3 refills | Status: DC

## 2020-06-29 MED FILL — SILDENAFIL CITRATE 100 MG T: 100 | 30 days supply | Qty: 4 | Fill #2

## 2020-06-29 MED FILL — GABAPENTIN 300 MG CAPSULE: 300 | 30 days supply | Qty: 120 | Fill #0

## 2020-06-29 MED FILL — ENTRESTO 97 MG-103 MG TAB: 97-103 | 30 days supply | Qty: 60 | Fill #0

## 2020-06-29 MED FILL — SPIRONOLACTONE 25 MG TABLET: 25 | 30 days supply | Qty: 30 | Fill #3

## 2020-06-29 NOTE — Progress Notes (Signed)
Met [name] in clinic today with Dr. McLean who reviewed medications and verified same. Dr. McLean adjusted [name] to Entresto 97-103 twice daily and Lasix 80mg twice daily. [name] was informed by Dr. McLean to encourage daily exercise daily. [name] reported he will start back at [facility] [employer] as soon as possible. [name] was also encouraged to decrease salt intake. [name] understood. I filled pill box accordingly. [name] is to pick up Pantoprazole, Gabapentin at pharmacy and place in pill box. I gave written instructions for same. [name] agreed. Clinic visit complete. I will see [name] at home in one week.   Refills: Seroquel Insulin Viagra Wellbutrin    Future Appointments  Date Time Provider Department Center  07/01/2020  3:30 PM Lane, Monica, RN [facility] None  07/09/2020 10:30 AM [facility] LAB [facility] None  07/16/2020 10:40 AM [place], Natalie M, FNP [facility] None  07/28/2020  3:00 PM McLean, [name] S, MD [facility] None     ACTION: Next visit planned for one week      

## 2020-06-29 NOTE — Patient Instructions (Signed)
Labs done today. We will contact you only if your labs are abnormal.  INCREASE Entresto 97-103mg (1 tablet) by mouth 2 times daily  INCREASE Lasix 80mg (2 tablets) by mouth 2 times daily.  No other medication changes were made. Please continue all other medications as prescribed.  Your physician recommends that you keep your pending appointment with Dr. Aundra Dubin and 10 day lab appointment.  If you have any questions or concerns before your next appointment please send [place] a message through [place] or call our office at [phone].    TO LEAVE A MESSAGE FOR THE NURSE SELECT OPTION 2, PLEASE LEAVE A MESSAGE INCLUDING: . YOUR NAME . DATE OF BIRTH . CALL BACK NUMBER . REASON FOR CALL**this is important as we prioritize the call backs  YOU WILL RECEIVE A CALL BACK THE SAME DAY AS LONG AS YOU CALL BEFORE 4:00 PM   Do the following things EVERYDAY: 1) Weigh yourself in the morning before breakfast. Write it down and keep it in a log. 2) Take your medicines as prescribed 3) Eat low salt foods--Limit salt (sodium) to 2000 mg per day.  4) Stay as active as you can everyday 5) Limit all fluids for the day to less than 2 liters   At the [facility], you and your health needs are our priority. As part of our continuing mission to provide you with exceptional heart care, we have created designated Provider Care Teams. These Care Teams include your primary Cardiologist (physician) and Advanced Practice Providers (APPs- Physician Assistants and Nurse Practitioners) who all work together to provide you with the care you need, when you need it.   You may see any of the following providers on your designated Care Team at your next follow up: [name] Kitchen Dr Glori Bickers . Dr Loralie Champagne . Darrick Grinder, NP . Lyda Jester, PA . [name], PharmD   Please be sure to bring in all your medications bottles to every appointment.

## 2020-06-30 NOTE — Progress Notes (Signed)
PCP: Nolene Ebbs, MD Cardiology: Dr. Radford Pax HF Cardiology: Dr. Aundra Dubin  59 y.o. with history of chronic systolic CHF, CAD, type 2 diabetes, paroxysmal atrial fibrillation, and schizophrenia was referred by Dr. Radford Pax for evaluation of CHF.  Patient had OM2 PCI in 2012.  In 11/20, he was admitted with CHF. Echo showed EF 25-30% with diffuse hypokinesis.  LHC was done, showing occluded OM2 at prior stent, 90% D1 stenosis, and extensive diffuse RCA disease.  No intervention.  He was thought to be in paroxysmal atrial fibrillation during this appointment and apixaban was started.   He does not smoke, rarely drinks, and does not use drugs.  His mother had "heart problems."    He can write his name only. He is only able to read a few words. Thinks he completed the 7th grade.    Echo in 5/21 showed EF 30% with diffuse hypokinesis, mild LVH, PASP 38, mildly decreased RV systolic function, IVC dilated.   He was hospitalized in 7/21 with upper GI bleeding and CHF exacerbation.  EGD showed duodenal AVMs, treated with APC. He was diuresed.   He returns for followup of CHF.  He is being followed by paramedicine.  Weight has been relatively stable since discharge.  No significant dyspnea walking on flat ground.  No orthopnea/PND.  No chest pain. No syncope/lightheadedness.  No melena/BRBPR.  Not regular with CPAP.   Labs (1/21): LDL 66, HDL 42, hgb 11.3, K 4.7, creatinine 1.32 Labs (4/21): K 5, creatinine 1.37 Labs (7/21): K 4.1, creatinine 1.26, hgb 9.3  ECG (personally reviewed): NSR, inferior TWIs  PMH:  1. Atrial fibrillation: Paroxysmal 2. Type 2 diabetes 3. HTN 4. Hyperlipidemia 5. Schizophrenia 6. CAD: PCI OM2 in 2012.  - LHC (11/20): 90% D1 stenosis, totally occluded OM2 at stent, serial 85%/70%/60% RCA stenoses.  7. 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   - Echo (11/20): EF 25-30%, global hypokinesis.  - Echo (5/21): EF 30% with diffuse hypokinesis,  mild LVH, PASP 38, mildly decreased RV systolic function, IVC dilated. 8. Upper GI bleeding: 7/21, duodenal AVMs treated with APC.   Social History   Socioeconomic History  . Marital status: Legally Separated    Spouse name: Not on file  . Number of children: Not on file  . Years of education: Not on file  . Highest education level: Not on file  Occupational History  . Not on file  Tobacco Use  . 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 Smokeless tobacco: Never Used  Vaping Use  . Vaping Use: Never used  Substance and Sexual Activity  . Alcohol use: Yes    Alcohol/week: 2.0 standard drinks    Types: 2 Cans of beer per week  . Drug use: No  . Sexual activity: Yes  Other Topics Concern  . Not on file  Social History Narrative  . Not on file   Social Determinants of Health   Financial Resource Strain: Low Risk   . Difficulty of Paying Living Expenses: Not very hard  Food Insecurity: No Food Insecurity  . Worried About Charity fundraiser in the Last Year: Never true  . Ran Out of Food in the Last Year: Never true  Transportation Needs: Unmet Transportation Needs  . Lack of Transportation (Medical): Yes  . Lack of Transportation (Non-Medical): Yes  Physical Activity:   . Days of Exercise per Week:   . Minutes of Exercise per Session:   Stress:   . Feeling of Stress :  Social Connections:   . Frequency of Communication with Friends and Family:   . Frequency of Social Gatherings with Friends and Family:   . Attends Religious Services:   . Active Member of Clubs or Organizations:   . Attends Archivist Meetings:   [name] Marital Status:   Intimate Partner Violence:   . Fear of Current or Ex-Partner:   . Emotionally Abused:   [name] Physically Abused:   . Sexually Abused:    Family History  Problem Relation Age of Onset  . Heart failure Mother   . Mental illness Sister   . Mental illness Sister    ROS: All systems reviewed and negative except as per HPI.  Current Meds   Medication Sig  . acetaminophen (TYLENOL) 500 MG tablet Take 500 mg by mouth every 6 (six) hours as needed for mild pain or moderate pain.  [name] apixaban (ELIQUIS) 5 MG TABS tablet Take 1 tablet (5 mg total) by mouth 2 (two) times daily.  [name] atorvastatin (LIPITOR) 40 MG tablet Take 1 tablet (40 mg total) by mouth daily at 6 PM.  . blood glucose meter kit and supplies KIT Dispense based on patient and insurance preference. Use up to four times daily as directed. (FOR ICD-9 250.00, 250.01). (Patient taking differently: 1 each by Other route See admin instructions. Dispense based on patient and insurance preference. Use up to four times daily as directed. (FOR ICD-9 250.00, 250.01).)  . buPROPion (WELLBUTRIN SR) 100 MG 12 hr tablet Take 1 tablet (100 mg total) by mouth 2 (two) times daily.  . carvedilol (COREG) 12.5 MG tablet Take 1 tablet (12.5 mg total) by mouth 2 (two) times daily with a meal.  . ferrous sulfate 325 (65 FE) MG tablet Take 1 tablet (325 mg total) by mouth daily with breakfast. (Patient taking differently: Take 325 mg by mouth 2 (two) times daily with a meal. )  . furosemide (LASIX) 40 MG tablet Take 2 tablets (80 mg total) by mouth 2 (two) times daily.  [name] gabapentin (NEURONTIN) 300 MG capsule TAKE 2 CAPSULES BY MOUTH 2 TIMES DAILY.  [name] Insulin Lispro Prot & Lispro (HUMALOG MIX 75/25 KWIKPEN) (75-25) 100 UNIT/ML Kwikpen Inject 30 Units into the skin 2 (two) times daily.  . Multiple Vitamins-Minerals (CENTRUM SILVER 50+MEN) TABS Take 1 tablet by mouth daily.  . pantoprazole (PROTONIX) 40 MG tablet TAKE 1 TABLET (40 MG TOTAL) BY MOUTH 2 (TWO) TIMES DAILY.  [name] potassium chloride SA (KLOR-CON M20) 20 MEQ tablet Take 1 tablet (20 mEq total) by mouth daily.  . QUEtiapine (SEROQUEL) 200 MG tablet Take 200 mg by mouth at bedtime.  . sildenafil (VIAGRA) 100 MG tablet Take 1 tablet (100 mg total) by mouth daily as needed for erectile dysfunction.  [name] spironolactone (ALDACTONE) 25 MG tablet Take 1  tablet (25 mg total) by mouth daily.  [name] tetrahydrozoline 0.05 % ophthalmic solution Place 1 drop into both eyes daily.  . TRUEPLUS PEN NEEDLES 31G X 6 MM MISC USE AS DIRECTED  . [DISCONTINUED] furosemide (LASIX) 40 MG tablet Take furosemide 80 mg (2 tablets) every morning and 40 mg (1 tablet) every evening.  . [DISCONTINUED] sacubitril-valsartan (ENTRESTO) 49-51 MG Take 1 tablet by mouth 2 (two) times daily.   BP 122/84   Pulse 73   Ht '6\' 2"'  (1.88 m)   Wt (!) 141 kg (310 lb 12.8 oz)   SpO2 93%   BMI 39.90 kg/m   Wt Readings from Last 3 Encounters:  06/29/20 (!) 141 kg (310 lb 12.8 oz)  06/29/20 (!) 140.6 kg (310 lb)  06/22/20 (!) 141.5 kg (312 lb)   .med Vitals:   06/29/20 1113  BP: 122/84  Pulse: 73  SpO2: 93%   General: NAD Neck: JVP 8 cm, no thyromegaly or thyroid nodule.  Lungs: Clear to auscultation bilaterally with normal respiratory effort. CV: Nondisplaced PMI.  Heart regular S1/S2, no S3/S4, no murmur. 1+ ankle edema.  No carotid bruit.  Normal pedal pulses.  Abdomen: Soft, nontender, no hepatosplenomegaly, no distention.  Skin: Intact without lesions or rashes.  Neurologic: Alert and oriented x 3.  Psych: Normal affect. Extremities: No clubbing or cyanosis.  HEENT: Normal.   Assessment/Plan:  1. Chronic systolic CHF: Echo in 72/89 with EF 25-30%, echo 5/21 with EF 30% with mildly decreased RV systolic function.  Suspect mixed ischemic/nonischemic cardiomyopathy.  He is mildly volume overloaded today, had recent admission with acute/chronic systolic CHF in 7/91.  - Increase Lasix to 80 mg bid, BMET today and in 10 days.  - Increase Entresto to 97/103 bid.  - Continue Coreg 12.5 mg bid.  - Continue spironolactone.   - With persistently low EF, should get ICD. Not CRT candidate. Will need to refer to EP for evaluation.  2. CAD: Last cath in 11/20 with occluded OM2 stent, 90% D1, severe diffuse disease in the RCA (no intervention). No recent chest pain.  - No ASA  given apixaban use.  - Continue atorvastatin. Good lipids in 1/21.  3. Atrial fibrillation: Paroxysmal.  He is in NSR today. Denies overt bleeding.  - Continue apixaban 5 mg bid.  - Check CBC.  4. DMII: Continue home regimen.  5. OSA: Continue CPAP => needs to use more regularly.  6. Upper GI bleeding: Occurred in 7/21.  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  No overt bleeding.   - CBC today.    Loralie Champagne 06/30/2020

## 2020-07-01 ENCOUNTER — Other Ambulatory Visit: Payer: Self-pay | Admitting: *Deleted

## 2020-07-01 MED FILL — HUMALOG MIX 75-25 KWIKPEN: (75-25) 100 | 25 days supply | Qty: 15 | Fill #5 | Status: TO

## 2020-07-01 NOTE — Patient Outreach (Signed)
[facility]) Care Management  07/01/2020  [name] 1961/08/09 [id number]   RED ON EMMI ALERT - General Discharge Day # 4 Date: 7/31 Red Alert Reason: other problems/questions   Outreach attempt #2, unsuccessful, unable to leave voice message.   Plan: RN CM will follow up within the next 3-4 business days.  Valente David, [place], MSN [place] [phone]

## 2020-07-05 ENCOUNTER — Telehealth (HOSPITAL_COMMUNITY): Payer: Self-pay

## 2020-07-05 NOTE — Telephone Encounter (Signed)
Informed patient that Eliquis is ready for pick up

## 2020-07-06 ENCOUNTER — Other Ambulatory Visit: Payer: Self-pay | Admitting: *Deleted

## 2020-07-06 ENCOUNTER — Other Ambulatory Visit (HOSPITAL_COMMUNITY): Payer: Self-pay

## 2020-07-06 NOTE — Progress Notes (Signed)
Paramedicine Encounter    Patient ID: [name], male    DOB: August 12, 1961, 59 y.o.   MRN: [id number]   Patient Care Team: Nolene Ebbs, MD as PCP - General (Internal Medicine) [name], MD as PCP - Cardiology (Cardiology) [name], MD as PCP - Advanced Heart Failure (Cardiology) Valente David, RN as [place] Management  Patient Active Problem List   Diagnosis Date Noted  . Acute blood loss anemia   . Angiodysplasia of stomach   . Chronic anticoagulation   . CHF exacerbation ([place]) 06/15/2020  . AKI (acute kidney injury) ([place]) 06/15/2020  . CKD (chronic kidney disease), stage III 06/15/2020  . Anemia due to chronic blood loss   . Gastric AVM   . Angiodysplasia of small intestine   . Acute on chronic systolic (congestive) heart failure ([place]) 04/05/2020  . Anemia 04/05/2020  . PAF (paroxysmal atrial fibrillation) ([place]) 04/05/2020  . OSA on CPAP 04/05/2020  . Syncope and collapse 04/05/2020  . Type 2 diabetes mellitus with hyperglycemia, with long-term current use of insulin ([place]) 12/03/2019  . Diabetic polyneuropathy associated with type 2 diabetes mellitus ([place]) 12/03/2019  . Hyperglycemia 12/03/2019  . History of hyperglycemia 12/03/2019  . Erectile dysfunction 12/03/2019  . CHF (congestive heart failure) ([place]) 10/11/2019  . Paranoid schizophrenia, chronic condition ([place]) 01/26/2015  . Severe recurrent major depressive disorder with psychotic features ([place]) 01/26/2015  . GAD (generalized anxiety disorder) 01/26/2015  . OCD (obsessive compulsive disorder) 01/26/2015  . Panic disorder without agoraphobia 01/26/2015  . Insomnia 01/26/2015    Current Outpatient Medications:  .  acetaminophen (TYLENOL) 500 MG tablet, Take 500 mg by mouth every 6 (six) hours as needed for mild pain or moderate pain., Disp: , Rfl:  .  apixaban (ELIQUIS) 5 MG TABS tablet, Take 1 tablet (5 mg total) by mouth 2 (two) times daily., Disp: 180 tablet, Rfl:  3 .  atorvastatin (LIPITOR) 40 MG tablet, Take 1 tablet (40 mg total) by mouth daily at 6 PM., Disp: 90 tablet, Rfl: 3 .  blood glucose meter kit and supplies KIT, Dispense based on patient and insurance preference. Use up to four times daily as directed. (FOR ICD-9 250.00, 250.01). (Patient taking differently: 1 each by Other route See admin instructions. Dispense based on patient and insurance preference. Use up to four times daily as directed. (FOR ICD-9 250.00, 250.01).), Disp: 1 each, Rfl: 0 .  buPROPion (WELLBUTRIN SR) 100 MG 12 hr tablet, Take 1 tablet (100 mg total) by mouth 2 (two) times daily., Disp: 60 tablet, Rfl: 6 .  carvedilol (COREG) 12.5 MG tablet, Take 1 tablet (12.5 mg total) by mouth 2 (two) times daily with a meal., Disp: 60 tablet, Rfl: 6 .  ferrous sulfate 325 (65 FE) MG tablet, Take 1 tablet (325 mg total) by mouth daily with breakfast. (Patient taking differently: Take 325 mg by mouth 2 (two) times daily with a meal. ), Disp: 30 tablet, Rfl: 3 .  furosemide (LASIX) 40 MG tablet, Take 2 tablets (80 mg total) by mouth 2 (two) times daily., Disp: 360 tablet, Rfl: 3 .  gabapentin (NEURONTIN) 300 MG capsule, TAKE 2 CAPSULES BY MOUTH 2 TIMES DAILY., Disp: 120 capsule, Rfl: 3 .  Insulin Lispro Prot & Lispro (HUMALOG MIX 75/25 KWIKPEN) (75-25) 100 UNIT/ML Kwikpen, Inject 30 Units into the skin 2 (two) times daily., Disp: 15 mL, Rfl: 11 .  Multiple Vitamins-Minerals (CENTRUM SILVER 50+MEN) TABS, Take 1 tablet by mouth daily., Disp: ,  Rfl:  .  pantoprazole (PROTONIX) 40 MG tablet, TAKE 1 TABLET (40 MG TOTAL) BY MOUTH 2 (TWO) TIMES DAILY., Disp: 60 tablet, Rfl: 1 .  potassium chloride SA (KLOR-CON M20) 20 MEQ tablet, Take 1 tablet (20 mEq total) by mouth daily., Disp: 30 tablet, Rfl: 0 .  QUEtiapine (SEROQUEL) 200 MG tablet, Take 200 mg by mouth at bedtime., Disp: , Rfl:  .  sacubitril-valsartan (ENTRESTO) 97-103 MG, Take 1 tablet by mouth 2 (two) times daily., Disp: 60 tablet, Rfl: 5 .   sildenafil (VIAGRA) 100 MG tablet, Take 1 tablet (100 mg total) by mouth daily as needed for erectile dysfunction., Disp: 10 tablet, Rfl: 3 .  spironolactone (ALDACTONE) 25 MG tablet, Take 1 tablet (25 mg total) by mouth daily., Disp: 30 tablet, Rfl: 11 .  tetrahydrozoline 0.05 % ophthalmic solution, Place 1 drop into both eyes daily., Disp: , Rfl:  .  TRUEPLUS PEN NEEDLES 31G X 6 MM MISC, USE AS DIRECTED, Disp: 100 each, Rfl: 0 No Known Allergies   Social History   Socioeconomic History  . Marital status: Legally Separated    Spouse name: Not on file  . Number of children: Not on file  . Years of education: Not on file  . Highest education level: Not on file  Occupational History  . Not on file  Tobacco Use  . 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 Smokeless tobacco: Never Used  Vaping Use  . Vaping Use: Never used  Substance and Sexual Activity  . Alcohol use: Yes    Alcohol/week: 2.0 standard drinks    Types: 2 Cans of beer per week  . Drug use: No  . Sexual activity: Yes  Other Topics Concern  . Not on file  Social History Narrative  . Not on file   Social Determinants of Health   Financial Resource Strain: Low Risk   . Difficulty of Paying Living Expenses: Not very hard  Food Insecurity: No Food Insecurity  . Worried About Charity fundraiser in the Last Year: Never true  . Ran Out of Food in the Last Year: Never true  Transportation Needs: Unmet Transportation Needs  . Lack of Transportation (Medical): Yes  . Lack of Transportation (Non-Medical): Yes  Physical Activity:   . Days of Exercise per Week:   . Minutes of Exercise per Session:   Stress:   . Feeling of Stress :   Social Connections:   . Frequency of Communication with Friends and Family:   . Frequency of Social Gatherings with Friends and Family:   . Attends Religious Services:   . Active Member of Clubs or Organizations:   . Attends Archivist Meetings:   [name] Kitchen Marital Status:   Intimate Partner  Violence:   . Fear of Current or Ex-Partner:   . Emotionally Abused:   [name] Kitchen Physically Abused:   . Sexually Abused:     Physical Exam Vitals reviewed.  Constitutional:      Appearance: He is normal weight.  HENT:     Head: Normocephalic.     Nose: Nose normal.     Mouth/Throat:     Mouth: Mucous membranes are moist.     Pharynx: Oropharynx is clear.  Eyes:     Pupils: Pupils are equal, round, and reactive to light.  Cardiovascular:     Rate and Rhythm: Normal rate and regular rhythm.     Pulses: Normal pulses.  Pulmonary:     Effort: Pulmonary effort is normal.  Breath sounds: Normal breath sounds.  Abdominal:     Palpations: Abdomen is soft.  Musculoskeletal:        General: Normal range of motion.     Cervical back: Normal range of motion.     Right lower leg: No edema.     Left lower leg: No edema.  Skin:    General: Skin is warm and dry.     Capillary Refill: Capillary refill takes less than 2 seconds.  Neurological:     Mental Status: He is alert. Mental status is at baseline.  Psychiatric:        Mood and Affect: Mood normal.    Arrived for home visit for [facility] who was alert and oriented seated on the couch with no complaints reporting he has been feeling good and has had no trouble breathing, no chest pain or discomfort. [name] has been compliant with medication over last week. Meds were verified and confirmed. Pill box filled accordingly. Vitals obtained and as recorded. Upcoming appointments reviewed with [name]. Home visit complete. I will see [name] in one week.   Refills:  Lasix  Wellbutrin   CBG- 111     Future Appointments  Date Time Provider [place]  07/06/2020 12:30 PM Valente David, RN [facility] None  07/09/2020 10:30 AM [facility] LAB [facility] None  07/16/2020 10:40 AM Azzie Glatter, FNP [facility] None  07/28/2020  3:00 PM [name], MD [facility] None     ACTION: Home visit completed Next visit planned for one week

## 2020-07-06 NOTE — Patient Outreach (Signed)
[facility]) Care Management  07/06/2020  SULTAN PARGAS 1961/06/21 [id number]   RED ON EMMI ALERT- General Discharge Day #4 Date:7/31 Red Alert Reason:other problems/questions  Outreach attempt #3, successful, identity verified.  This care manager introduced self and stated purpose of call.  [facility] care management services explained.    Social: Member report he is independent in all ADL's.  State he does have a support team in place if needed.  He has EMT that visits weekly from the Paramedicine program, last visit was today.  Denies any shortness of breath or chest discomfort.  Conditions: Per chart, has history of paranoid schizophrenia, DM, CKD, OSA, CHF, HTN, and A-fib.  Report he is now starting to check his weights, blood pressure, and blood sugar daily.  Weight today was 306 (report this is down from 340 just a few months ago) and blood sugar was 106.  Medications: Reviewed with member, state [name], EMT, also reviewed and helped to fill his pill box.  State he does have some concern about affording Entresto and Eliquis.  He received samples of Eliquis from MD office.  Agrees to follow up from [place] team.  Appointments: Was seen at heart failure clinic on 8/3, will have follow up on 9/1.  Also has appointment on 8/20 with PCP.  Report he uses [facility] sometimes but transportation can be an issue.  Advised that [facility] also has a transportation benefit for MD visits, provided with contact information for [employer].  Denies any urgent concerns, advised to follow up with this care manager with any questions.  Plan: RN CM will place referral to pharmacy team.  Will also send member education regarding heart failure management and follow up within the next 2 weeks.  Goals Addressed              This Visit's Progress     Manage condition and not be readmitted.  Increase activity. (pt-stated)        CARE PLAN ENTRY (see longtitudinal plan of care for  additional care plan information)   Current Barriers:   Knowledge deficit related to basic heart failure pathophysiology and self care management  Case Manager Clinical Goal(s):   Over the next 45 days, patient will verbalize understanding of Heart Failure Action Plan and when to call doctor  Over the next 28 days, patient will take all Heart Failure mediations as prescribed  Over the next 28 days, patient will weigh daily and record (notifying MD of 3 lb weight gain over night or 5 lb in a week)  Interventions:   Provided written education on low sodium diet  Reviewed Heart Failure Action Plan in depth and provided written copy  Discussed importance of daily weight and advised patient to weigh and record daily  Patient Self Care Activities:   Weighs daily and record (notifying MD of 3 lb weight gain over night or 5 lb in a week)  Verbalizes understanding of and follows CHF Action Plan  Adheres to low sodium diet  Initial goal documentation       Valente David, RN, MSN [place] Manager [phone]

## 2020-07-08 MED FILL — BUPROPION HCL SR 100 MG TAB: 100 | 30 days supply | Qty: 60 | Fill #2

## 2020-07-08 MED FILL — QUETIAPINE FUMARATE 200 MG: 200 | 30 days supply | Qty: 30 | Fill #0

## 2020-07-08 NOTE — Patient Outreach (Signed)
Referral from Valente David, RN to [place] for Medication Assistance.

## 2020-07-09 ENCOUNTER — Other Ambulatory Visit: Payer: Self-pay

## 2020-07-09 ENCOUNTER — Ambulatory Visit (HOSPITAL_COMMUNITY)
Admission: RE | Admit: 2020-07-09 | Discharge: 2020-07-09 | Disposition: A | Discharge status: Home / Self Care | Payer: Medicare Other | Source: Ambulatory Visit | Attending: Cardiology | Admitting: Cardiology

## 2020-07-09 DIAGNOSIS — I5022 Chronic systolic (congestive) heart failure: Secondary | ICD-10-CM | POA: Diagnosis not present

## 2020-07-09 LAB — BASIC METABOLIC PANEL
Anion gap: 11 (ref 5–15)
BUN: 19 mg/dL (ref 6–20)
CO2: 26 mmol/L (ref 22–32)
Calcium: 9.2 mg/dL (ref 8.9–10.3)
Chloride: 104 mmol/L (ref 98–111)
Creatinine, Ser: 1.33 mg/dL — ABNORMAL HIGH (ref 0.61–1.24)
GFR calc Af Amer: >60 mL/min (ref >60)
GFR calc non Af Amer: 58 mL/min — ABNORMAL LOW (ref >60)
Glucose, Bld: 125 mg/dL — ABNORMAL HIGH (ref 70–99)
Potassium: 3.8 mmol/L (ref 3.5–5.1)
Sodium: 141 mmol/L (ref 135–145)

## 2020-07-13 ENCOUNTER — Other Ambulatory Visit (HOSPITAL_COMMUNITY): Payer: Self-pay

## 2020-07-13 NOTE — Progress Notes (Signed)
Paramedicine Encounter    Patient ID: [name], male    DOB: 1961-10-13, 59 y.o.   MRN: [id number]   Patient Care Team: Azzie Glatter, FNP as PCP - General (Family Medicine) [name], MD as PCP - Cardiology (Cardiology) [name], MD as PCP - Advanced Heart Failure (Cardiology) Valente David, RN as [place] [facility]  Patient Active Problem List   Diagnosis Date Noted  . Acute blood loss anemia   . Angiodysplasia of stomach   . Chronic anticoagulation   . CHF exacerbation ([place]) 06/15/2020  . AKI (acute kidney injury) ([place]) 06/15/2020  . CKD (chronic kidney disease), stage III 06/15/2020  . Anemia due to chronic blood loss   . Gastric AVM   . Angiodysplasia of small intestine   . Acute on chronic systolic (congestive) heart failure ([place]) 04/05/2020  . Anemia 04/05/2020  . PAF (paroxysmal atrial fibrillation) ([place]) 04/05/2020  . OSA on CPAP 04/05/2020  . Syncope and collapse 04/05/2020  . Type 2 diabetes mellitus with hyperglycemia, with long-term current use of insulin ([place]) 12/03/2019  . Diabetic polyneuropathy associated with type 2 diabetes mellitus ([place]) 12/03/2019  . Hyperglycemia 12/03/2019  . History of hyperglycemia 12/03/2019  . Erectile dysfunction 12/03/2019  . CHF (congestive heart failure) ([place]) 10/11/2019  . Paranoid schizophrenia, chronic condition ([place]) 01/26/2015  . Severe recurrent major depressive disorder with psychotic features ([place]) 01/26/2015  . GAD (generalized anxiety disorder) 01/26/2015  . OCD (obsessive compulsive disorder) 01/26/2015  . Panic disorder without agoraphobia 01/26/2015  . Insomnia 01/26/2015    Current Outpatient Medications:  .  acetaminophen (TYLENOL) 500 MG tablet, Take 500 mg by mouth every 6 (six) hours as needed for mild pain or moderate pain., Disp: , Rfl:  .  apixaban (ELIQUIS) 5 MG TABS tablet, Take 1 tablet (5 mg total) by mouth 2 (two) times daily., Disp: 180 tablet, Rfl:  3 .  atorvastatin (LIPITOR) 40 MG tablet, Take 1 tablet (40 mg total) by mouth daily at 6 PM., Disp: 90 tablet, Rfl: 3 .  blood glucose meter kit and supplies KIT, Dispense based on patient and insurance preference. Use up to four times daily as directed. (FOR ICD-9 250.00, 250.01). (Patient taking differently: 1 each by Other route See admin instructions. Dispense based on patient and insurance preference. Use up to four times daily as directed. (FOR ICD-9 250.00, 250.01).), Disp: 1 each, Rfl: 0 .  buPROPion (WELLBUTRIN SR) 100 MG 12 hr tablet, Take 1 tablet (100 mg total) by mouth 2 (two) times daily., Disp: 60 tablet, Rfl: 6 .  carvedilol (COREG) 12.5 MG tablet, Take 1 tablet (12.5 mg total) by mouth 2 (two) times daily with a meal., Disp: 60 tablet, Rfl: 6 .  ferrous sulfate 325 (65 FE) MG tablet, Take 1 tablet (325 mg total) by mouth daily with breakfast. (Patient taking differently: Take 325 mg by mouth 2 (two) times daily with a meal. ), Disp: 30 tablet, Rfl: 3 .  furosemide (LASIX) 40 MG tablet, Take 2 tablets (80 mg total) by mouth 2 (two) times daily., Disp: 360 tablet, Rfl: 3 .  gabapentin (NEURONTIN) 300 MG capsule, TAKE 2 CAPSULES BY MOUTH 2 TIMES DAILY., Disp: 120 capsule, Rfl: 3 .  Insulin Lispro Prot & Lispro (HUMALOG MIX 75/25 KWIKPEN) (75-25) 100 UNIT/ML Kwikpen, Inject 30 Units into the skin 2 (two) times daily., Disp: 15 mL, Rfl: 11 .  Multiple Vitamins-Minerals (CENTRUM SILVER 50+MEN) TABS, Take 1 tablet by mouth daily.,  Disp: , Rfl:  .  pantoprazole (PROTONIX) 40 MG tablet, TAKE 1 TABLET (40 MG TOTAL) BY MOUTH 2 (TWO) TIMES DAILY., Disp: 60 tablet, Rfl: 1 .  potassium chloride SA (KLOR-CON M20) 20 MEQ tablet, Take 1 tablet (20 mEq total) by mouth daily., Disp: 30 tablet, Rfl: 0 .  QUEtiapine (SEROQUEL) 200 MG tablet, Take 200 mg by mouth at bedtime. (Patient not taking: Reported on 07/06/2020), Disp: , Rfl:  .  sacubitril-valsartan (ENTRESTO) 97-103 MG, Take 1 tablet by mouth 2  (two) times daily., Disp: 60 tablet, Rfl: 5 .  sildenafil (VIAGRA) 100 MG tablet, Take 1 tablet (100 mg total) by mouth daily as needed for erectile dysfunction. (Patient not taking: Reported on 07/06/2020), Disp: 10 tablet, Rfl: 3 .  spironolactone (ALDACTONE) 25 MG tablet, Take 1 tablet (25 mg total) by mouth daily., Disp: 30 tablet, Rfl: 11 .  tetrahydrozoline 0.05 % ophthalmic solution, Place 1 drop into both eyes daily., Disp: , Rfl:  .  TRUEPLUS PEN NEEDLES 31G X 6 MM MISC, USE AS DIRECTED, Disp: 100 each, Rfl: 0 No Known Allergies   Social History   Socioeconomic History  . Marital status: Legally Separated    Spouse name: Not on file  . Number of children: Not on file  . Years of education: Not on file  . Highest education level: Not on file  Occupational History  . Not on file  Tobacco Use  . 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 Smokeless tobacco: Never Used  Vaping Use  . Vaping Use: Never used  Substance and Sexual Activity  . Alcohol use: Yes    Alcohol/week: 2.0 standard drinks    Types: 2 Cans of beer per week  . Drug use: No  . Sexual activity: Yes  Other Topics Concern  . Not on file  Social History Narrative  . Not on file   Social Determinants of Health   Financial Resource Strain: Low Risk   . Difficulty of Paying Living Expenses: Not very hard  Food Insecurity: No Food Insecurity  . Worried About Charity fundraiser in the Last Year: Never true  . Ran Out of Food in the Last Year: Never true  Transportation Needs: Unmet Transportation Needs  . Lack of Transportation (Medical): Yes  . Lack of Transportation (Non-Medical): Yes  Physical Activity:   . Days of Exercise per Week:   . Minutes of Exercise per Session:   Stress:   . Feeling of Stress :   Social Connections:   . Frequency of Communication with Friends and Family:   . Frequency of Social Gatherings with Friends and Family:   . Attends Religious Services:   . Active Member of Clubs or  Organizations:   . Attends Archivist Meetings:   [name] Kitchen Marital Status:   Intimate Partner Violence:   . Fear of Current or Ex-Partner:   . Emotionally Abused:   [name] Kitchen Physically Abused:   . Sexually Abused:     Physical Exam Vitals reviewed.  Constitutional:      Appearance: He is normal weight.  HENT:     Head: Normocephalic.     Nose: Nose normal.     Mouth/Throat:     Mouth: Mucous membranes are moist.     Pharynx: Oropharynx is clear.  Eyes:     Pupils: Pupils are equal, round, and reactive to light.  Cardiovascular:     Rate and Rhythm: Normal rate and regular rhythm.     Pulses:  Normal pulses.     Heart sounds: Normal heart sounds.  Pulmonary:     Effort: Pulmonary effort is normal.     Breath sounds: Normal breath sounds.  Abdominal:     Palpations: Abdomen is soft.  Musculoskeletal:        General: Normal range of motion.     Cervical back: Normal range of motion.     Right lower leg: No edema.     Left lower leg: No edema.  Skin:    General: Skin is warm and dry.     Capillary Refill: Capillary refill takes less than 2 seconds.  Neurological:     Mental Status: He is alert. Mental status is at baseline.  Psychiatric:        Mood and Affect: Mood normal.   Arrived for home visit for [name] who was alert and oriented reporting mouth pain from dental concerns reporting he thinks he needs to get some teeth extracted. I assisted patient with finding local dentist to seek further care. Vitals were obtained. Medications reviewed and confirmed. Pill box filled accordingly.   [facility] RN Valente David initiated care management with sending patient care packet, I reviewed same with patient. I will reach out to RN to discuss mutual patient concerns.   I will see patient in one week.   Refills: Lasix       Future Appointments  Date Time Provider [place]  07/16/2020 10:40 AM Azzie Glatter, FNP [facility] None  07/21/2020  1:30 PM Valente David, RN [facility]  None  07/28/2020  3:00 PM [name], MD [facility] None     ACTION: Home visit completed Next visit planned for one week

## 2020-07-16 ENCOUNTER — Ambulatory Visit: Payer: Self-pay | Admitting: Family Medicine

## 2020-07-19 ENCOUNTER — Ambulatory Visit (INDEPENDENT_AMBULATORY_CARE_PROVIDER_SITE_OTHER): Payer: Medicare Other | Admitting: Family Medicine

## 2020-07-19 ENCOUNTER — Other Ambulatory Visit: Payer: Self-pay

## 2020-07-19 ENCOUNTER — Encounter: Payer: Self-pay | Admitting: Family Medicine

## 2020-07-19 VITALS — BP 171/86 | Temp 98.2°F | Ht 74.0 in | Wt 310.6 lb

## 2020-07-19 DIAGNOSIS — Z794 Long term (current) use of insulin: Secondary | ICD-10-CM | POA: Diagnosis not present

## 2020-07-19 DIAGNOSIS — Z09 Encounter for follow-up examination after completed treatment for conditions other than malignant neoplasm: Secondary | ICD-10-CM

## 2020-07-19 DIAGNOSIS — E1165 Type 2 diabetes mellitus with hyperglycemia: Secondary | ICD-10-CM | POA: Diagnosis not present

## 2020-07-19 DIAGNOSIS — K029 Dental caries, unspecified: Secondary | ICD-10-CM

## 2020-07-19 DIAGNOSIS — K047 Periapical abscess without sinus: Secondary | ICD-10-CM | POA: Diagnosis not present

## 2020-07-19 DIAGNOSIS — F419 Anxiety disorder, unspecified: Secondary | ICD-10-CM

## 2020-07-19 DIAGNOSIS — N529 Male erectile dysfunction, unspecified: Secondary | ICD-10-CM

## 2020-07-19 MED ORDER — SILDENAFIL CITRATE 100 MG PO TABS: 100.0000 mg | ORAL_TABLET | Freq: Every day | ORAL | 6 refills | Status: DC | PRN

## 2020-07-19 MED ORDER — IBUPROFEN 800 MG PO TABS: 800.0000 mg | ORAL_TABLET | Freq: Three times a day (TID) | ORAL | 3 refills | Status: DC | PRN

## 2020-07-19 MED ORDER — ACETAMINOPHEN 500 MG PO TABS: 500.0000 mg | ORAL_TABLET | Freq: Four times a day (QID) | ORAL | 3 refills | Status: DC | PRN

## 2020-07-19 MED ORDER — AMOXICILLIN-POT CLAVULANATE 875-125 MG PO TABS: 1.0000 | ORAL_TABLET | Freq: Two times a day (BID) | ORAL | 0 refills | Status: AC

## 2020-07-19 MED FILL — IBUPROFEN 800 MG TABLET: 800 | 10 days supply | Qty: 30 | Fill #0

## 2020-07-19 MED FILL — AMOX-CLAV 875-125 MG TABLET: 875-125 | 10 days supply | Qty: 20 | Fill #0

## 2020-07-19 NOTE — Progress Notes (Signed)
Patient [facility]   Established Patient Office Visit  Subjective:  Patient ID: [name], male    DOB: July 05, 1961  Age: 59 y.o. MRN: [id number]  CC:  Chief Complaint  Patient presents with  . Follow-up     Pt states no question or concerns @ the moment. Pt states his tooth is hurting.    HPI [name] is a 59 year old who presents for Follow Up today.   Patient Active Problem List   Diagnosis Date Noted  . Acute blood loss anemia   . Angiodysplasia of stomach   . Chronic anticoagulation   . CHF exacerbation ([place]) 06/15/2020  . AKI (acute kidney injury) ([place]) 06/15/2020  . CKD (chronic kidney disease), stage III 06/15/2020  . Anemia due to chronic blood loss   . Gastric AVM   . Angiodysplasia of small intestine   . Acute on chronic systolic (congestive) heart failure ([place]) 04/05/2020  . Anemia 04/05/2020  . PAF (paroxysmal atrial fibrillation) ([place]) 04/05/2020  . OSA on CPAP 04/05/2020  . Syncope and collapse 04/05/2020  . Type 2 diabetes mellitus with hyperglycemia, with long-term current use of insulin ([place]) 12/03/2019  . Diabetic polyneuropathy associated with type 2 diabetes mellitus ([place]) 12/03/2019  . Hyperglycemia 12/03/2019  . History of hyperglycemia 12/03/2019  . Erectile dysfunction 12/03/2019  . CHF (congestive heart failure) ([place]) 10/11/2019  . Paranoid schizophrenia, chronic condition ([place]) 01/26/2015  . Severe recurrent major depressive disorder with psychotic features ([place]) 01/26/2015  . GAD (generalized anxiety disorder) 01/26/2015  . OCD (obsessive compulsive disorder) 01/26/2015  . Panic disorder without agoraphobia 01/26/2015  . Insomnia 01/26/2015    Past Medical History:  Diagnosis Date  . Anxiety   . CAD (coronary artery disease)   . CHF (congestive heart failure) ([place]) 09/2019  . Depression   . Diabetes mellitus   . Erectile dysfunction 11/2019  . H/O right heart  catheterization 09/2019  . Hypertension   . Schizophrenia ([place])   . Sleep apnea    uses cpap   Current Status: Since her last office visit, he has c/o dental pain, which he is scheduled to have 3 teeth removed in a few weeks. She has seen low range of 102 and high of 300 since his last office visit. She denies fatigue, frequent urination, blurred vision, excessive hunger, excessive thirst, weight gain, weight loss, and poor wound healing. She continues to check her feet regularly. She denies visual changes, chest pain, cough, shortness of breath, heart palpitations, and falls. She has occasional headaches and dizziness with position changes. Denies severe headaches, confusion, seizures, double vision, and blurred vision, nausea and vomiting.  She denies fevers, chills, fatigue, recent infections, weight loss, and night sweats. Denies GI problems such as diarrhea, and constipation. She has no reports of blood in stools, dysuria and hematuria. No depression or anxiety reported today. She is taking all medications as prescribed.    Past Surgical History:  Procedure Laterality Date  . CORONARY STENT PLACEMENT    . ENTEROSCOPY N/A 04/08/2020   Procedure: ENTEROSCOPY;  Surgeon: Doran Stabler, MD;  Location: [facility] ENDOSCOPY;  Service: Gastroenterology;  Laterality: N/A;  . ENTEROSCOPY N/A 06/17/2020   Procedure: ENTEROSCOPY;  Surgeon: Irene Shipper, MD;  Location: [facility] ENDOSCOPY;  Service: Endoscopy;  Laterality: N/A;  . HEMOSTASIS CLIP PLACEMENT  04/08/2020   Procedure: HEMOSTASIS CLIP PLACEMENT;  Surgeon: Doran Stabler, MD;  Location: [place];  Service: Gastroenterology;;  . Laretta Alstrom CONTROL  04/08/2020   Procedure: HEMOSTASIS CONTROL;  Surgeon: Doran Stabler, MD;  Location: [facility] ENDOSCOPY;  Service: Gastroenterology;;  . Laretta Alstrom CONTROL  06/17/2020   Procedure: HEMOSTASIS CONTROL;  Surgeon: Irene Shipper, MD;  Location: [facility] END[facility]COPY;  Service: Endoscopy;;  . HERNIA REPAIR    .  RIGHT/LEFT HEART CATH AND CORONARY ANGIOGRAPHY N/A 10/14/2019   Procedure: RIGHT/LEFT HEART CATH AND CORONARY ANGIOGRAPHY;  Surgeon: Belva Crome, MD;  Location: [place] CV LAB;  Service: Cardiovascular;  Laterality: N/A;    Family History  Problem Relation Age of Onset  . Heart failure Mother   . Mental illness Sister   . Mental illness Sister     Social History   Socioeconomic History  . Marital status: Legally Separated    Spouse name: Not on file  . Number of children: Not on file  . Years of education: Not on file  . Highest education level: Not on file  Occupational History  . Not on file  Tobacco Use  . 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 Smokeless tobacco: Never Used  Vaping Use  . Vaping Use: Never used  Substance and Sexual Activity  . Alcohol use: Yes    Alcohol/week: 2.0 standard drinks    Types: 2 Cans of beer per week  . Drug use: No  . Sexual activity: Yes  Other Topics Concern  . Not on file  Social History Narrative  . Not on file   Social Determinants of Health   Financial Resource Strain: Low Risk   . Difficulty of Paying Living Expenses: Not very hard  Food Insecurity: No Food Insecurity  . Worried About Charity fundraiser in the Last Year: Never true  . Ran Out of Food in the Last Year: Never true  Transportation Needs: Unmet Transportation Needs  . Lack of Transportation (Medical): Yes  . Lack of Transportation (Non-Medical): Yes  Physical Activity:   . Days of Exercise per Week: Not on file  . Minutes of Exercise per Session: Not on file  Stress:   . Feeling of Stress : Not on file  Social Connections:   . Frequency of Communication with Friends and Family: Not on file  . Frequency of Social Gatherings with Friends and Family: Not on file  . Attends Religious Services: Not on file  . Active Member of Clubs or Organizations: Not on file  . Attends Archivist Meetings: Not on file  . Marital Status: Not on file  Intimate  Partner Violence:   . Fear of Current or Ex-Partner: Not on file  . Emotionally Abused: Not on file  . Physically Abused: Not on file  . Sexually Abused: Not on file    Outpatient Medications Prior to Visit  Medication Sig Dispense Refill  . apixaban (ELIQUIS) 5 MG TABS tablet Take 1 tablet (5 mg total) by mouth 2 (two) times daily. 180 tablet 3  . atorvastatin (LIPITOR) 40 MG tablet Take 1 tablet (40 mg total) by mouth daily at 6 PM. 90 tablet 3  . blood glucose meter kit and supplies KIT Dispense based on patient and insurance preference. Use up to four times daily as directed. (FOR ICD-9 250.00, 250.01). (Patient taking differently: 1 each by Other route See admin instructions. Dispense based on patient and insurance preference. Use up to four times daily as directed. (FOR ICD-9 250.00, 250.01).) 1 each 0  . buPROPion (WELLBUTRIN SR) 100 MG 12 hr  tablet Take 1 tablet (100 mg total) by mouth 2 (two) times daily. 60 tablet 6  . carvedilol (COREG) 12.5 MG tablet Take 1 tablet (12.5 mg total) by mouth 2 (two) times daily with a meal. 60 tablet 6  . cyclobenzaprine (FLEXERIL) 10 MG tablet Take 10 mg by mouth 3 (three) times daily as needed for muscle spasms.    . ferrous sulfate 325 (65 FE) MG tablet Take 1 tablet (325 mg total) by mouth daily with breakfast. (Patient taking differently: Take 325 mg by mouth 2 (two) times daily with a meal. ) 30 tablet 3  . furosemide (LASIX) 40 MG tablet Take 2 tablets (80 mg total) by mouth 2 (two) times daily. 360 tablet 3  . gabapentin (NEURONTIN) 300 MG capsule TAKE 2 CAPSULES BY MOUTH 2 TIMES DAILY. 120 capsule 3  . Insulin Lispro Prot & Lispro (HUMALOG MIX 75/25 KWIKPEN) (75-25) 100 UNIT/ML Kwikpen Inject 30 Units into the skin 2 (two) times daily. 15 mL 11  . Multiple Vitamins-Minerals (CENTRUM SILVER 50+MEN) TABS Take 1 tablet by mouth daily.    . pantoprazole (PROTONIX) 40 MG tablet TAKE 1 TABLET (40 MG TOTAL) BY MOUTH 2 (TWO) TIMES DAILY. 60 tablet 1  .  QUEtiapine (SEROQUEL) 200 MG tablet Take 200 mg by mouth at bedtime.     . sacubitril-valsartan (ENTRESTO) 97-103 MG Take 1 tablet by mouth 2 (two) times daily. 60 tablet 5  . spironolactone (ALDACTONE) 25 MG tablet Take 1 tablet (25 mg total) by mouth daily. 30 tablet 11  . tetrahydrozoline 0.05 % ophthalmic solution Place 1 drop into both eyes daily.    . TRUEPLUS PEN NEEDLES 31G X 6 MM MISC USE AS DIRECTED 100 each 0  . acetaminophen (TYLENOL) 500 MG tablet Take 500 mg by mouth every 6 (six) hours as needed for mild pain or moderate pain.    . sildenafil (VIAGRA) 100 MG tablet Take 1 tablet (100 mg total) by mouth daily as needed for erectile dysfunction. 10 tablet 3  . potassium chloride SA (KLOR-CON M20) 20 MEQ tablet Take 1 tablet (20 mEq total) by mouth daily. 30 tablet 0   No facility-administered medications prior to visit.    No Known Allergies  ROS Review of Systems  Constitutional: Negative.   HENT: Negative.   Eyes: Negative.   Respiratory: Negative.   Cardiovascular: Negative.   Gastrointestinal: Positive for abdominal distention.  Endocrine: Negative.   Genitourinary: Negative.        Erectile dysfuction  Musculoskeletal: Positive for arthralgias (generalized joint pain).  Skin: Negative.   Allergic/Immunologic: Negative.   Neurological: Positive for dizziness (occasional ) and headaches (occasional).  Hematological: Negative.   Psychiatric/Behavioral: Negative.       Objective:    Physical Exam Vitals and nursing note reviewed.  Constitutional:      Appearance: Normal appearance. He is obese.  HENT:     Head: Normocephalic and atraumatic.     Nose: Nose normal.     Mouth/Throat:     Mouth: Mucous membranes are moist.     Pharynx: Oropharynx is clear.  Cardiovascular:     Rate and Rhythm: Normal rate and regular rhythm.     Pulses: Normal pulses.     Heart sounds: Normal heart sounds.  Pulmonary:     Effort: Pulmonary effort is normal.     Breath  sounds: Normal breath sounds.  Abdominal:     General: Bowel sounds are normal.     Palpations: Abdomen is  soft.  Musculoskeletal:        General: Normal range of motion.     Cervical back: Normal range of motion and neck supple.  Skin:    General: Skin is warm and dry.  Neurological:     General: No focal deficit present.     Mental Status: He is alert and oriented to person, place, and time.  Psychiatric:        Mood and Affect: Mood normal.        Behavior: Behavior normal.        Thought Content: Thought content normal.        Judgment: Judgment normal.     BP (!) 171/86 (BP Location: Left Arm, Patient Position: Sitting, Cuff Size: Large)   Temp 98.2 F (36.8 C)   Ht '6\' 2"'  (1.88 m)   Wt (!) 310 lb 9.6 oz (140.9 kg)   SpO2 98%   BMI 39.88 kg/m  Wt Readings from Last 3 Encounters:  07/19/20 (!) 310 lb 9.6 oz (140.9 kg)  07/13/20 (!) 310 lb (140.6 kg)  07/06/20 (!) 306 lb 9.6 oz (139.1 kg)     Health Maintenance Due  Topic Date Due  . Hepatitis C Screening  Never done  . FOOT EXAM  Never done  . OPHTHALMOLOGY EXAM  Never done  . URINE MICROALBUMIN  Never done  . TETANUS/TDAP  Never done  . COLONOSCOPY  Never done  . COVID-19 Vaccine (2 - Pfizer 2-dose series) 04/03/2020  . INFLUENZA VACCINE  06/27/2020    There are no preventive care reminders to display for this patient.  Lab Results  Component Value Date   TSH 4.110 12/02/2019   Lab Results  Component Value Date   WBC 5.7 06/29/2020   HGB 11.1 (L) 06/29/2020   HCT 40.1 06/29/2020   MCV 83.5 06/29/2020   PLT 437 (H) 06/29/2020   Lab Results  Component Value Date   NA 141 07/09/2020   K 3.8 07/09/2020   CO2 26 07/09/2020   GLUCOSE 125 (H) 07/09/2020   BUN 19 07/09/2020   CREATININE 1.33 (H) 07/09/2020   BILITOT 0.8 04/05/2020   ALKPHOS 60 04/05/2020   AST 24 04/05/2020   ALT 26 04/05/2020   PROT 6.8 04/05/2020   ALBUMIN 3.1 (L) 04/05/2020   CALCIUM 9.2 07/09/2020   ANIONGAP 11 07/09/2020    Lab Results  Component Value Date   CHOL 120 12/02/2019   Lab Results  Component Value Date   HDL 42 12/02/2019   Lab Results  Component Value Date   LDLCALC 66 12/02/2019   Lab Results  Component Value Date   TRIG 51 12/02/2019   Lab Results  Component Value Date   CHOLHDL 2.9 12/02/2019   Lab Results  Component Value Date   HGBA1C 6.5 (A) 04/16/2020      Assessment & Plan:   1. Type 2 diabetes mellitus with hyperglycemia, with long-term current use of insulin (HCC) She will continue medication as prescribed, to decrease foods/beverages high in sugars and carbs and follow Heart Healthy or DASH diet. Increase physical activity to at least 30 minutes cardio exercise daily.  - Vitamin B12 - Vitamin D, 25-hydroxy - TSH - Lipid Panel  2. Erectile dysfunction, unspecified erectile dysfunction type - sildenafil (VIAGRA) 100 MG tablet; Take 1 tablet (100 mg total) by mouth daily as needed for erectile dysfunction.  Dispense: 10 tablet; Refill: 6  3. Dental abscess - amoxicillin-clavulanate (AUGMENTIN) 875-125 MG tablet; Take 1 tablet  by mouth 2 (two) times daily for 10 days.  Dispense: 20 tablet; Refill: 0 - acetaminophen (TYLENOL) 500 MG tablet; Take 1 tablet (500 mg total) by mouth every 6 (six) hours as needed for mild pain or moderate pain.  Dispense: 60 tablet; Refill: 3  4. Pain due to dental caries - amoxicillin-clavulanate (AUGMENTIN) 875-125 MG tablet; Take 1 tablet by mouth 2 (two) times daily for 10 days.  Dispense: 20 tablet; Refill: 0 - acetaminophen (TYLENOL) 500 MG tablet; Take 1 tablet (500 mg total) by mouth every 6 (six) hours as needed for mild pain or moderate pain.  Dispense: 60 tablet; Refill: 3  5. Anxiety Stable today.   6. Follow up He will follow up in 6 months.   Meds ordered this encounter  Medications  . sildenafil (VIAGRA) 100 MG tablet    Sig: Take 1 tablet (100 mg total) by mouth daily as needed for erectile dysfunction.     Dispense:  10 tablet    Refill:  6  . amoxicillin-clavulanate (AUGMENTIN) 875-125 MG tablet    Sig: Take 1 tablet by mouth 2 (two) times daily for 10 days.    Dispense:  20 tablet    Refill:  0  . DISCONTD: ibuprofen (ADVIL) 800 MG tablet    Sig: Take 1 tablet (800 mg total) by mouth every 8 (eight) hours as needed.    Dispense:  30 tablet    Refill:  3  . acetaminophen (TYLENOL) 500 MG tablet    Sig: Take 1 tablet (500 mg total) by mouth every 6 (six) hours as needed for mild pain or moderate pain.    Dispense:  60 tablet    Refill:  3   Orders Placed This Encounter  Procedures  . Vitamin B12  . Vitamin D, 25-hydroxy  . TSH  . Lipid Panel    Referral Orders  No referral(s) requested today    Kathe Becton,  MSN, FNP-BC [facility] [place] [address] [place], [place] [address] [phone] fax   Problem List Items Addressed This Visit      Endocrine   Type 2 diabetes mellitus with hyperglycemia, with long-term current use of insulin ([place]) - Primary   Relevant Orders   Vitamin B12   Vitamin D, 25-hydroxy   TSH   Lipid Panel     Other   Erectile dysfunction   Relevant Medications   sildenafil (VIAGRA) 100 MG tablet    Other Visit Diagnoses    Dental abscess       Relevant Medications   amoxicillin-clavulanate (AUGMENTIN) 875-125 MG tablet   acetaminophen (TYLENOL) 500 MG tablet   Pain due to dental caries       Relevant Medications   amoxicillin-clavulanate (AUGMENTIN) 875-125 MG tablet   acetaminophen (TYLENOL) 500 MG tablet   Anxiety       Follow up          Meds ordered this encounter  Medications  . sildenafil (VIAGRA) 100 MG tablet    Sig: Take 1 tablet (100 mg total) by mouth daily as needed for erectile dysfunction.    Dispense:  10 tablet    Refill:  6  . amoxicillin-clavulanate (AUGMENTIN) 875-125 MG tablet    Sig: Take 1 tablet by mouth 2  (two) times daily for 10 days.    Dispense:  20 tablet    Refill:  0  . DISCONTD: ibuprofen (ADVIL) 800 MG tablet  Sig: Take 1 tablet (800 mg total) by mouth every 8 (eight) hours as needed.    Dispense:  30 tablet    Refill:  3  . acetaminophen (TYLENOL) 500 MG tablet    Sig: Take 1 tablet (500 mg total) by mouth every 6 (six) hours as needed for mild pain or moderate pain.    Dispense:  60 tablet    Refill:  3    Follow-up: Return in about 6 months (around 01/19/2021).    Azzie Glatter, FNP

## 2020-07-20 ENCOUNTER — Other Ambulatory Visit (HOSPITAL_COMMUNITY): Payer: Self-pay | Admitting: *Deleted

## 2020-07-20 ENCOUNTER — Other Ambulatory Visit (HOSPITAL_COMMUNITY): Payer: Self-pay

## 2020-07-20 ENCOUNTER — Other Ambulatory Visit: Payer: Self-pay | Admitting: Cardiology

## 2020-07-20 LAB — LIPID PANEL
Chol/HDL Ratio: 2.8 ratio (ref 0.0–5.0)
Cholesterol, Total: 116 mg/dL (ref 100–199)
HDL: 42 mg/dL (ref >39)
LDL Chol Calc (NIH): 51 mg/dL (ref 0–99)
Triglycerides: 128 mg/dL (ref 0–149)
VLDL Cholesterol Cal: 23 mg/dL (ref 5–40)

## 2020-07-20 LAB — TSH: TSH: 5.32 u[IU]/mL — ABNORMAL HIGH (ref 0.450–4.500)

## 2020-07-20 LAB — VITAMIN D 25 HYDROXY (VIT D DEFICIENCY, FRACTURES): Vit D, 25-Hydroxy: 25.4 ng/mL — ABNORMAL LOW (ref 30.0–100.0)

## 2020-07-20 LAB — VITAMIN B12: Vitamin B-12: 739 pg/mL (ref 232–1245)

## 2020-07-20 MED ORDER — FUROSEMIDE 40 MG PO TABS: 80.0000 mg | ORAL_TABLET | Freq: Two times a day (BID) | ORAL | 3 refills | Status: DC

## 2020-07-20 NOTE — Progress Notes (Signed)
Paramedicine Encounter    Patient ID: [name], male    DOB: [birth year], 60 y.o.   MRN: [id number]   Patient Care Team: Azzie Glatter, FNP as PCP - General (Family Medicine) [name], MD as PCP - Cardiology (Cardiology) [name], MD as PCP - Advanced Heart Failure (Cardiology) Valente David, RN as [place] Management  Patient Active Problem List   Diagnosis Date Noted  . Acute blood loss anemia   . Angiodysplasia of stomach   . Chronic anticoagulation   . CHF exacerbation ([place]) 06/15/2020  . AKI (acute kidney injury) ([place]) 06/15/2020  . CKD (chronic kidney disease), stage III 06/15/2020  . Anemia due to chronic blood loss   . Gastric AVM   . Angiodysplasia of small intestine   . Acute on chronic systolic (congestive) heart failure ([place]) 04/05/2020  . Anemia 04/05/2020  . PAF (paroxysmal atrial fibrillation) ([place]) 04/05/2020  . OSA on CPAP 04/05/2020  . Syncope and collapse 04/05/2020  . Type 2 diabetes mellitus with hyperglycemia, with long-term current use of insulin ([place]) 12/03/2019  . Diabetic polyneuropathy associated with type 2 diabetes mellitus ([place]) 12/03/2019  . Hyperglycemia 12/03/2019  . History of hyperglycemia 12/03/2019  . Erectile dysfunction 12/03/2019  . CHF (congestive heart failure) ([place]) 10/11/2019  . Paranoid schizophrenia, chronic condition ([place]) 01/26/2015  . Severe recurrent major depressive disorder with psychotic features ([place]) 01/26/2015  . GAD (generalized anxiety disorder) 01/26/2015  . OCD (obsessive compulsive disorder) 01/26/2015  . Panic disorder without agoraphobia 01/26/2015  . Insomnia 01/26/2015    Current Outpatient Medications:  .  acetaminophen (TYLENOL) 500 MG tablet, Take 1 tablet (500 mg total) by mouth every 6 (six) hours as needed for mild pain or moderate pain., Disp: 60 tablet, Rfl: 3 .  amoxicillin-clavulanate (AUGMENTIN) 875-125 MG tablet, Take 1 tablet by mouth 2 (two) times  daily for 10 days., Disp: 20 tablet, Rfl: 0 .  apixaban (ELIQUIS) 5 MG TABS tablet, Take 1 tablet (5 mg total) by mouth 2 (two) times daily., Disp: 180 tablet, Rfl: 3 .  atorvastatin (LIPITOR) 40 MG tablet, Take 1 tablet (40 mg total) by mouth daily at 6 PM., Disp: 90 tablet, Rfl: 3 .  blood glucose meter kit and supplies KIT, Dispense based on patient and insurance preference. Use up to four times daily as directed. (FOR ICD-9 250.00, 250.01). (Patient taking differently: 1 each by Other route See admin instructions. Dispense based on patient and insurance preference. Use up to four times daily as directed. (FOR ICD-9 250.00, 250.01).), Disp: 1 each, Rfl: 0 .  buPROPion (WELLBUTRIN SR) 100 MG 12 hr tablet, Take 1 tablet (100 mg total) by mouth 2 (two) times daily., Disp: 60 tablet, Rfl: 6 .  carvedilol (COREG) 12.5 MG tablet, Take 1 tablet (12.5 mg total) by mouth 2 (two) times daily with a meal., Disp: 60 tablet, Rfl: 6 .  cyclobenzaprine (FLEXERIL) 10 MG tablet, Take 10 mg by mouth 3 (three) times daily as needed for muscle spasms., Disp: , Rfl:  .  ferrous sulfate 325 (65 FE) MG tablet, Take 1 tablet (325 mg total) by mouth daily with breakfast. (Patient taking differently: Take 325 mg by mouth 2 (two) times daily with a meal. ), Disp: 30 tablet, Rfl: 3 .  furosemide (LASIX) 40 MG tablet, Take 2 tablets (80 mg total) by mouth 2 (two) times daily., Disp: 360 tablet, Rfl: 3 .  gabapentin (NEURONTIN) 300 MG capsule, TAKE 2 CAPSULES BY  MOUTH 2 TIMES DAILY., Disp: 120 capsule, Rfl: 3 .  Insulin Lispro Prot & Lispro (HUMALOG MIX 75/25 KWIKPEN) (75-25) 100 UNIT/ML Kwikpen, Inject 30 Units into the skin 2 (two) times daily., Disp: 15 mL, Rfl: 11 .  Multiple Vitamins-Minerals (CENTRUM SILVER 50+MEN) TABS, Take 1 tablet by mouth daily., Disp: , Rfl:  .  pantoprazole (PROTONIX) 40 MG tablet, TAKE 1 TABLET (40 MG TOTAL) BY MOUTH 2 (TWO) TIMES DAILY., Disp: 60 tablet, Rfl: 1 .  potassium chloride SA (KLOR-CON  M20) 20 MEQ tablet, Take 1 tablet (20 mEq total) by mouth daily., Disp: 30 tablet, Rfl: 0 .  QUEtiapine (SEROQUEL) 200 MG tablet, Take 200 mg by mouth at bedtime. , Disp: , Rfl:  .  sacubitril-valsartan (ENTRESTO) 97-103 MG, Take 1 tablet by mouth 2 (two) times daily., Disp: 60 tablet, Rfl: 5 .  sildenafil (VIAGRA) 100 MG tablet, Take 1 tablet (100 mg total) by mouth daily as needed for erectile dysfunction., Disp: 10 tablet, Rfl: 6 .  spironolactone (ALDACTONE) 25 MG tablet, Take 1 tablet (25 mg total) by mouth daily., Disp: 30 tablet, Rfl: 11 .  tetrahydrozoline 0.05 % ophthalmic solution, Place 1 drop into both eyes daily., Disp: , Rfl:  .  TRUEPLUS PEN NEEDLES 31G X 6 MM MISC, USE AS DIRECTED, Disp: 100 each, Rfl: 0 No Known Allergies   Social History   Socioeconomic History  . Marital status: Legally Separated    Spouse name: Not on file  . Number of children: Not on file  . Years of education: Not on file  . Highest education level: Not on file  Occupational History  . Not on file  Tobacco Use  . 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 Smokeless tobacco: Never Used  Vaping Use  . Vaping Use: Never used  Substance and Sexual Activity  . Alcohol use: Yes    Alcohol/week: 2.0 standard drinks    Types: 2 Cans of beer per week  . Drug use: No  . Sexual activity: Yes  Other Topics Concern  . Not on file  Social History Narrative  . Not on file   Social Determinants of Health   Financial Resource Strain: Low Risk   . Difficulty of Paying Living Expenses: Not very hard  Food Insecurity: No Food Insecurity  . Worried About Charity fundraiser in the Last Year: Never true  . Ran Out of Food in the Last Year: Never true  Transportation Needs: Unmet Transportation Needs  . Lack of Transportation (Medical): Yes  . Lack of Transportation (Non-Medical): Yes  Physical Activity:   . Days of Exercise per Week: Not on file  . Minutes of Exercise per Session: Not on file  Stress:   .  Feeling of Stress : Not on file  Social Connections:   . Frequency of Communication with Friends and Family: Not on file  . Frequency of Social Gatherings with Friends and Family: Not on file  . Attends Religious Services: Not on file  . Active Member of Clubs or Organizations: Not on file  . Attends Archivist Meetings: Not on file  . Marital Status: Not on file  Intimate Partner Violence:   . Fear of Current or Ex-Partner: Not on file  . Emotionally Abused: Not on file  . Physically Abused: Not on file  . Sexually Abused: Not on file    Physical Exam Vitals reviewed.  Constitutional:      Appearance: He is normal weight.  HENT:  Head: Normocephalic.     Nose: Nose normal.     Mouth/Throat:     Mouth: Mucous membranes are moist.     Pharynx: Oropharynx is clear.  Eyes:     Pupils: Pupils are equal, round, and reactive to light.  Cardiovascular:     Rate and Rhythm: Normal rate and regular rhythm.     Pulses: Normal pulses.     Heart sounds: Normal heart sounds.  Pulmonary:     Effort: Pulmonary effort is normal.     Breath sounds: Normal breath sounds.  Abdominal:     Palpations: Abdomen is soft.  Musculoskeletal:        General: Normal range of motion.     Cervical back: Normal range of motion.     Right lower leg: No edema.     Left lower leg: No edema.  Skin:    General: Skin is warm and dry.     Capillary Refill: Capillary refill takes less than 2 seconds.  Neurological:     Mental Status: He is alert. Mental status is at baseline.  Psychiatric:        Mood and Affect: Mood normal.      Arrived for home visit for [name] who was alert and oriented reporting he has been feeling light headed over the last few days when he started taking amoxicillin for upcoming dental procedure. I reported to [name] he needs to make sure he is eating and drinking enough fluids while taking anti-biotics. [name] verbalized understanding. Vitals were obtained. [name] noted  to be hypertensive, he has been taking his medications as prescribed. Medications were verified and confirmed. Pill box filled accordingly. [name] agreed if symptoms persisted to contact me, her agreed. [name] agreed to home visit in one week. Home visit complete.   Dental procedure scheduled for the 30th.   Refills: Lasix (needs weds PM-Tues PM) Pantoprazole Spironolactone    Future Appointments  Date Time Provider [place]  07/21/2020  1:30 PM Valente David, RN [facility] None  07/28/2020  3:00 PM [name], MD [facility] None  01/19/2021  9:00 AM Azzie Glatter, FNP [facility] None     ACTION: Home visit completed Next visit planned for one week

## 2020-07-21 ENCOUNTER — Other Ambulatory Visit: Payer: Self-pay | Admitting: *Deleted

## 2020-07-21 MED FILL — FUROSEMIDE 40 MG TAB: 40 | 30 days supply | Qty: 120 | Fill #0

## 2020-07-21 MED FILL — PANTOPRAZOLE SOD DR 40 MG T: 40 | 30 days supply | Qty: 60 | Fill #1

## 2020-07-21 MED FILL — HUMALOG MIX 75-25 KWIKPEN: (75-25) 100 | 25 days supply | Qty: 15 | Fill #6 | Status: TO

## 2020-07-21 NOTE — Patient Outreach (Signed)
[name]) Care Management  07/21/2020  [name] December 27, 1960 [id number]   Call placed to member to follow up on management of HF.  He report he is doing much better, denies any shortness of breath or chest discomfort.  Nira Conn, EMT with paramedicine, visited member yesterday, member state she was able to review medications and help him fill his pill box.  She will visit again next Tuesday.  He also has follow up appointment with HF clinic on 9/1.  Report his weight has been stable at 310 pounds.  Admit that he has struggled to follow low sodium and diabetic diet (blood sugar today was 130).  He is trying to eat healthier but state he has a toothache and has been eating softer foods (to include ramen noodles), will need extractions.  He has an appointment with dentist 8/31.  He verbalized understanding of appropriate diet.  Denies any urgent concerns, encouraged to contact this care manager with questions.  Will follow up within the next month.  Will collaborate with Nira Conn as needed.  Goals Addressed              This Visit's Progress   .  Manage condition and not be readmitted.  Increase activity. (pt-stated)   On track     [place] (see longtitudinal plan of care for additional care plan information)   Current Barriers:  [name] Kitchen Knowledge deficit related to basic heart failure pathophysiology and self care management  Case Manager Clinical Goal(s):  [name] Kitchen Over the next 45 days, patient will verbalize understanding of Heart Failure Action Plan and when to call doctor . Over the next 28 days, patient will take all Heart Failure mediations as prescribed . Over the next 28 days, patient will weigh daily and record (notifying MD of 3 lb weight gain over night or 5 lb in a week)  Interventions:  . Provided written education on low sodium diet . Reviewed Heart Failure Action Plan in depth and provided written copy . Discussed importance of daily weight and  advised patient to weigh and record daily  Patient Self Care Activities:  . Weighs daily and record (notifying MD of 3 lb weight gain over night or 5 lb in a week) . Verbalizes understanding of and follows CHF Action Plan . Adheres to low sodium diet  Initial goal documentation       Valente David, RN, MSN [place] [phone]

## 2020-07-22 MED FILL — SPIRONOLACTONE 25 MG TABLET: 25 | 30 days supply | Qty: 30 | Fill #4

## 2020-07-22 NOTE — Progress Notes (Signed)
Pt was called @ 3:15 pm. Pt did not answer so a VM was left for him to give [place] a call back.

## 2020-07-27 ENCOUNTER — Other Ambulatory Visit (HOSPITAL_COMMUNITY): Payer: Self-pay

## 2020-07-27 NOTE — Progress Notes (Signed)
Paramedicine Encounter    Patient ID: [name], male    DOB: 01/13/1961, 59 y.o.   MRN: [id number]   Patient Care Team: Azzie Glatter, FNP as PCP - General (Family Medicine) [name], MD as PCP - Cardiology (Cardiology) [name], MD as PCP - Advanced Heart Failure (Cardiology) Valente David, RN as [place] Management  Patient Active Problem List   Diagnosis Date Noted  . Acute blood loss anemia   . Angiodysplasia of stomach   . Chronic anticoagulation   . CHF exacerbation ([place]) 06/15/2020  . AKI (acute kidney injury) ([place]) 06/15/2020  . CKD (chronic kidney disease), stage III 06/15/2020  . Anemia due to chronic blood loss   . Gastric AVM   . Angiodysplasia of small intestine   . Acute on chronic systolic (congestive) heart failure ([place]) 04/05/2020  . Anemia 04/05/2020  . PAF (paroxysmal atrial fibrillation) ([place]) 04/05/2020  . OSA on CPAP 04/05/2020  . Syncope and collapse 04/05/2020  . Type 2 diabetes mellitus with hyperglycemia, with long-term current use of insulin ([place]) 12/03/2019  . Diabetic polyneuropathy associated with type 2 diabetes mellitus ([place]) 12/03/2019  . Hyperglycemia 12/03/2019  . History of hyperglycemia 12/03/2019  . Erectile dysfunction 12/03/2019  . CHF (congestive heart failure) ([place]) 10/11/2019  . Paranoid schizophrenia, chronic condition ([place]) 01/26/2015  . Severe recurrent major depressive disorder with psychotic features ([place]) 01/26/2015  . GAD (generalized anxiety disorder) 01/26/2015  . OCD (obsessive compulsive disorder) 01/26/2015  . Panic disorder without agoraphobia 01/26/2015  . Insomnia 01/26/2015    Current Outpatient Medications:  .  acetaminophen (TYLENOL) 500 MG tablet, Take 1 tablet (500 mg total) by mouth every 6 (six) hours as needed for mild pain or moderate pain., Disp: 60 tablet, Rfl: 3 .  amoxicillin-clavulanate (AUGMENTIN) 875-125 MG tablet, Take 1 tablet by mouth 2 (two) times  daily for 10 days., Disp: 20 tablet, Rfl: 0 .  apixaban (ELIQUIS) 5 MG TABS tablet, Take 1 tablet (5 mg total) by mouth 2 (two) times daily., Disp: 180 tablet, Rfl: 3 .  atorvastatin (LIPITOR) 40 MG tablet, Take 1 tablet (40 mg total) by mouth daily at 6 PM., Disp: 90 tablet, Rfl: 3 .  blood glucose meter kit and supplies KIT, Dispense based on patient and insurance preference. Use up to four times daily as directed. (FOR ICD-9 250.00, 250.01). (Patient taking differently: 1 each by Other route See admin instructions. Dispense based on patient and insurance preference. Use up to four times daily as directed. (FOR ICD-9 250.00, 250.01).), Disp: 1 each, Rfl: 0 .  buPROPion (WELLBUTRIN SR) 100 MG 12 hr tablet, Take 1 tablet (100 mg total) by mouth 2 (two) times daily., Disp: 60 tablet, Rfl: 6 .  carvedilol (COREG) 12.5 MG tablet, Take 1 tablet (12.5 mg total) by mouth 2 (two) times daily with a meal., Disp: 60 tablet, Rfl: 6 .  cyclobenzaprine (FLEXERIL) 10 MG tablet, Take 10 mg by mouth 3 (three) times daily as needed for muscle spasms. (Patient not taking: Reported on 07/20/2020), Disp: , Rfl:  .  ferrous sulfate 325 (65 FE) MG tablet, Take 1 tablet (325 mg total) by mouth daily with breakfast. (Patient taking differently: Take 325 mg by mouth 2 (two) times daily with a meal. ), Disp: 30 tablet, Rfl: 3 .  furosemide (LASIX) 40 MG tablet, Take 2 tablets (80 mg total) by mouth 2 (two) times daily., Disp: 360 tablet, Rfl: 3 .  gabapentin (NEURONTIN) 300  MG capsule, TAKE 2 CAPSULES BY MOUTH 2 TIMES DAILY., Disp: 120 capsule, Rfl: 3 .  Insulin Lispro Prot & Lispro (HUMALOG MIX 75/25 KWIKPEN) (75-25) 100 UNIT/ML Kwikpen, Inject 30 Units into the skin 2 (two) times daily., Disp: 15 mL, Rfl: 11 .  Multiple Vitamins-Minerals (CENTRUM SILVER 50+MEN) TABS, Take 1 tablet by mouth daily., Disp: , Rfl:  .  pantoprazole (PROTONIX) 40 MG tablet, TAKE 1 TABLET (40 MG TOTAL) BY MOUTH 2 (TWO) TIMES DAILY., Disp: 60 tablet,  Rfl: 1 .  potassium chloride SA (KLOR-CON M20) 20 MEQ tablet, Take 1 tablet (20 mEq total) by mouth daily., Disp: 30 tablet, Rfl: 0 .  QUEtiapine (SEROQUEL) 200 MG tablet, Take 200 mg by mouth at bedtime. , Disp: , Rfl:  .  sacubitril-valsartan (ENTRESTO) 97-103 MG, Take 1 tablet by mouth 2 (two) times daily., Disp: 60 tablet, Rfl: 5 .  sildenafil (VIAGRA) 100 MG tablet, Take 1 tablet (100 mg total) by mouth daily as needed for erectile dysfunction., Disp: 10 tablet, Rfl: 6 .  spironolactone (ALDACTONE) 25 MG tablet, Take 1 tablet (25 mg total) by mouth daily., Disp: 30 tablet, Rfl: 11 .  tetrahydrozoline 0.05 % ophthalmic solution, Place 1 drop into both eyes daily., Disp: , Rfl:  .  TRUEPLUS PEN NEEDLES 31G X 6 MM MISC, USE AS DIRECTED, Disp: 100 each, Rfl: 0 No Known Allergies   Social History   Socioeconomic History  . Marital status: Legally Separated    Spouse name: Not on file  . Number of children: Not on file  . Years of education: Not on file  . Highest education level: Not on file  Occupational History  . Not on file  Tobacco Use  . 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 Smokeless tobacco: Never Used  Vaping Use  . Vaping Use: Never used  Substance and Sexual Activity  . Alcohol use: Yes    Alcohol/week: 2.0 standard drinks    Types: 2 Cans of beer per week  . Drug use: No  . Sexual activity: Yes  Other Topics Concern  . Not on file  Social History Narrative  . Not on file   Social Determinants of Health   Financial Resource Strain: Low Risk   . Difficulty of Paying Living Expenses: Not very hard  Food Insecurity: No Food Insecurity  . Worried About Charity fundraiser in the Last Year: Never true  . Ran Out of Food in the Last Year: Never true  Transportation Needs: Unmet Transportation Needs  . Lack of Transportation (Medical): Yes  . Lack of Transportation (Non-Medical): Yes  Physical Activity:   . Days of Exercise per Week: Not on file  . Minutes of Exercise  per Session: Not on file  Stress:   . Feeling of Stress : Not on file  Social Connections:   . Frequency of Communication with Friends and Family: Not on file  . Frequency of Social Gatherings with Friends and Family: Not on file  . Attends Religious Services: Not on file  . Active Member of Clubs or Organizations: Not on file  . Attends Archivist Meetings: Not on file  . Marital Status: Not on file  Intimate Partner Violence:   . Fear of Current or Ex-Partner: Not on file  . Emotionally Abused: Not on file  . Physically Abused: Not on file  . Sexually Abused: Not on file    Physical Exam  Arrived for home visit for [name] who was alert  and oriented reporting he was going to be seen by a dentist today to have some of his teeth cut out. [name] had been taking Augmentin and Ibuprofen for same over the last week. [name] was instructed to verify with his dentist if he is to continue these or not. He agreed and understood. Medications were verified and confirmed. Pill box filled accordingly. Vitals were obtained. [name] is losing weight and has no edema noted. [name] denied shortness of breath or chest pain. [name] agreed to visit in one week. Home visit complete.   Refills: Spiro Pantoprazole Carvedilol   CBG- 202     Future Appointments  Date Time Provider [place]  07/28/2020  3:00 PM [name], MD [facility] None  08/19/2020 10:30 AM Valente David, RN [facility] None  01/19/2021  9:00 AM Azzie Glatter, FNP [facility] None     ACTION: Home visit completed Next visit planned for one week

## 2020-07-28 ENCOUNTER — Other Ambulatory Visit: Payer: Self-pay

## 2020-07-28 ENCOUNTER — Telehealth: Payer: Self-pay

## 2020-07-28 ENCOUNTER — Encounter (HOSPITAL_COMMUNITY): Payer: Self-pay | Admitting: Cardiology

## 2020-07-28 ENCOUNTER — Ambulatory Visit (HOSPITAL_COMMUNITY)
Admission: RE | Admit: 2020-07-28 | Discharge: 2020-07-28 | Disposition: A | Discharge status: Home / Self Care | Payer: Medicare Other | Source: Ambulatory Visit | Attending: Cardiology | Admitting: Cardiology

## 2020-07-28 ENCOUNTER — Other Ambulatory Visit: Payer: Self-pay | Admitting: Cardiology

## 2020-07-28 VITALS — BP 126/84 | HR 64 | Ht 74.0 in | Wt 308.0 lb

## 2020-07-28 DIAGNOSIS — E119 Type 2 diabetes mellitus without complications: Secondary | ICD-10-CM | POA: Diagnosis not present

## 2020-07-28 DIAGNOSIS — I480 Paroxysmal atrial fibrillation: Secondary | ICD-10-CM | POA: Diagnosis not present

## 2020-07-28 DIAGNOSIS — I5022 Chronic systolic (congestive) heart failure: Secondary | ICD-10-CM | POA: Insufficient documentation

## 2020-07-28 DIAGNOSIS — F209 Schizophrenia, unspecified: Secondary | ICD-10-CM | POA: Insufficient documentation

## 2020-07-28 DIAGNOSIS — Z955 Presence of coronary angioplasty implant and graft: Secondary | ICD-10-CM | POA: Insufficient documentation

## 2020-07-28 DIAGNOSIS — Z8249 Family history of ischemic heart disease and other diseases of the circulatory system: Secondary | ICD-10-CM | POA: Diagnosis not present

## 2020-07-28 DIAGNOSIS — E785 Hyperlipidemia, unspecified: Secondary | ICD-10-CM | POA: Insufficient documentation

## 2020-07-28 DIAGNOSIS — Z794 Long term (current) use of insulin: Secondary | ICD-10-CM | POA: Diagnosis not present

## 2020-07-28 DIAGNOSIS — Z79899 Other long term (current) drug therapy: Secondary | ICD-10-CM | POA: Diagnosis not present

## 2020-07-28 DIAGNOSIS — G4733 Obstructive sleep apnea (adult) (pediatric): Secondary | ICD-10-CM | POA: Diagnosis not present

## 2020-07-28 DIAGNOSIS — I110 Hypertensive heart disease with heart failure: Secondary | ICD-10-CM | POA: Insufficient documentation

## 2020-07-28 DIAGNOSIS — I2510 Atherosclerotic heart disease of native coronary artery without angina pectoris: Secondary | ICD-10-CM | POA: Diagnosis not present

## 2020-07-28 DIAGNOSIS — Z550 Illiteracy and low-level literacy: Secondary | ICD-10-CM | POA: Diagnosis not present

## 2020-07-28 DIAGNOSIS — Z7901 Long term (current) use of anticoagulants: Secondary | ICD-10-CM | POA: Diagnosis not present

## 2020-07-28 DIAGNOSIS — Z87891 Personal history of nicotine dependence: Secondary | ICD-10-CM | POA: Diagnosis not present

## 2020-07-28 DIAGNOSIS — Z8719 Personal history of other diseases of the digestive system: Secondary | ICD-10-CM | POA: Insufficient documentation

## 2020-07-28 LAB — BASIC METABOLIC PANEL
Anion gap: 9 (ref 5–15)
BUN: 21 mg/dL — ABNORMAL HIGH (ref 6–20)
CO2: 26 mmol/L (ref 22–32)
Calcium: 8.7 mg/dL — ABNORMAL LOW (ref 8.9–10.3)
Chloride: 105 mmol/L (ref 98–111)
Creatinine, Ser: 1.32 mg/dL — ABNORMAL HIGH (ref 0.61–1.24)
GFR calc Af Amer: >60 mL/min (ref >60)
GFR calc non Af Amer: 58 mL/min — ABNORMAL LOW (ref >60)
Glucose, Bld: 205 mg/dL — ABNORMAL HIGH (ref 70–99)
Potassium: 4.2 mmol/L (ref 3.5–5.1)
Sodium: 140 mmol/L (ref 135–145)

## 2020-07-28 MED ORDER — CARVEDILOL 12.5 MG PO TABS: 18.7500 mg | ORAL_TABLET | Freq: Two times a day (BID) | ORAL | 6 refills | Status: DC

## 2020-07-28 MED FILL — CARVEDILOL 12.5 MG TABLET: 12.5 | 30 days supply | Qty: 60 | Fill #0

## 2020-07-28 NOTE — Patient Instructions (Signed)
Increase Carvedilol to 18.75 mg (1 & 1/2 tabs) Twice daily   Labs done today, we will call you for any abnormal results  You have been referred to EP to discuss getting a defibrillator   Your physician recommends that you schedule a follow-up appointment in: 3 months  If you have any questions or concerns before your next appointment please send [place] a message through [place] or call our office at [phone].    TO LEAVE A MESSAGE FOR THE NURSE SELECT OPTION 2, PLEASE LEAVE A MESSAGE INCLUDING: . YOUR NAME . DATE OF BIRTH . CALL BACK NUMBER . REASON FOR CALL**this is important as we prioritize the call backs  [place] AS LONG AS YOU CALL BEFORE 4:00 PM  At the [facility], you and your health needs are our priority. As part of our continuing mission to provide you with exceptional heart care, we have created designated Provider Care Teams. These Care Teams include your primary Cardiologist (physician) and Advanced Practice Providers (APPs- Physician Assistants and Nurse Practitioners) who all work together to provide you with the care you need, when you need it.   You may see any of the following providers on your designated Care Team at your next follow up: [name] Kitchen Dr Glori Bickers . Dr Loralie Champagne . Darrick Grinder, NP . Lyda Jester, PA . [name], PharmD   Please be sure to bring in all your medications bottles to every appointment.

## 2020-07-29 NOTE — Progress Notes (Signed)
PCP: Azzie Glatter, FNP Cardiology: Dr. Radford Pax HF Cardiology: Dr. Aundra Dubin  59 y.o. with history of chronic systolic CHF, CAD, type 2 diabetes, paroxysmal atrial fibrillation, and schizophrenia was referred by Dr. Radford Pax for evaluation of CHF.  Patient had OM2 PCI in 2012.  In 11/20, he was admitted with CHF. Echo showed EF 25-30% with diffuse hypokinesis.  LHC was done, showing occluded OM2 at prior stent, 90% D1 stenosis, and extensive diffuse RCA disease.  No intervention.  He was thought to be in paroxysmal atrial fibrillation during this appointment and apixaban was started.   He does not smoke, rarely drinks, and does not use drugs.  His mother had "heart problems."    He can write his name only. He is only able to read a few words. Thinks he completed the 7th grade.    Echo in 5/21 showed EF 30% with diffuse hypokinesis, mild LVH, PASP 38, mildly decreased RV systolic function, IVC dilated.   He was hospitalized in 7/21 with upper GI bleeding and CHF exacerbation.  EGD showed duodenal AVMs, treated with APC. He was diuresed.   He returns for followup of CHF.  He is being followed by paramedicine.  Weight down 2 lbs. No significant exertional dyspnea.  No chest pain.  No orthopnea/PND.  No BRBPR/melena.  Overall, feels like he is doing well.   Labs (1/21): LDL 66, HDL 42, hgb 11.3, K 4.7, creatinine 1.32 Labs (4/21): K 5, creatinine 1.37 Labs (7/21): K 4.1, creatinine 1.26, hgb 9.3 Labs (8/21): LDL 51, K 3.8, creatinine 1.3  PMH:  1. Atrial fibrillation: Paroxysmal 2. Type 2 diabetes 3. HTN 4. Hyperlipidemia 5. Schizophrenia 6. CAD: PCI OM2 in 2012.  - LHC (11/20): 90% D1 stenosis, totally occluded OM2 at stent, serial 85%/70%/60% RCA stenoses.  7. 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   - Echo (11/20): EF 25-30%, global hypokinesis.  - Echo (5/21): EF 30% with diffuse hypokinesis, mild LVH, PASP 38, mildly decreased RV systolic function, IVC  dilated. 8. Upper GI bleeding: 7/21, duodenal AVMs treated with APC.  9. OSA: Does not use CPAP regularly.   Social History   Socioeconomic History  . Marital status: Legally Separated    Spouse name: Not on file  . Number of children: Not on file  . Years of education: Not on file  . Highest education level: Not on file  Occupational History  . Not on file  Tobacco Use  . 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 Smokeless tobacco: Never Used  Vaping Use  . Vaping Use: Never used  Substance and Sexual Activity  . Alcohol use: Yes    Alcohol/week: 2.0 standard drinks    Types: 2 Cans of beer per week  . Drug use: No  . Sexual activity: Yes  Other Topics Concern  . Not on file  Social History Narrative  . Not on file   Social Determinants of Health   Financial Resource Strain: Low Risk   . Difficulty of Paying Living Expenses: Not very hard  Food Insecurity: No Food Insecurity  . Worried About Charity fundraiser in the Last Year: Never true  . Ran Out of Food in the Last Year: Never true  Transportation Needs: Unmet Transportation Needs  . Lack of Transportation (Medical): Yes  . Lack of Transportation (Non-Medical): Yes  Physical Activity:   . Days of Exercise per Week: Not on file  . Minutes of Exercise per Session: Not on file  Stress:   .  Feeling of Stress : Not on file  Social Connections:   . Frequency of Communication with Friends and Family: Not on file  . Frequency of Social Gatherings with Friends and Family: Not on file  . Attends Religious Services: Not on file  . Active Member of Clubs or Organizations: Not on file  . Attends Archivist Meetings: Not on file  . Marital Status: Not on file  Intimate Partner Violence:   . Fear of Current or Ex-Partner: Not on file  . Emotionally Abused: Not on file  . Physically Abused: Not on file  . Sexually Abused: Not on file   Family History  Problem Relation Age of Onset  . Heart failure Mother   . Mental  illness Sister   . Mental illness Sister    ROS: All systems reviewed and negative except as per HPI.  Current Meds  Medication Sig  . amoxicillin-clavulanate (AUGMENTIN) 875-125 MG tablet Take 1 tablet by mouth 2 (two) times daily for 10 days.  [name] Kitchen apixaban (ELIQUIS) 5 MG TABS tablet Take 1 tablet (5 mg total) by mouth 2 (two) times daily.  [name] Kitchen atorvastatin (LIPITOR) 40 MG tablet Take 1 tablet (40 mg total) by mouth daily at 6 PM.  . buPROPion (WELLBUTRIN SR) 100 MG 12 hr tablet Take 1 tablet (100 mg total) by mouth 2 (two) times daily.  . carvedilol (COREG) 12.5 MG tablet Take 1.5 tablets (18.75 mg total) by mouth 2 (two) times daily with a meal.  . cyclobenzaprine (FLEXERIL) 10 MG tablet Take 10 mg by mouth 3 (three) times daily as needed for muscle spasms.   . ferrous sulfate 325 (65 FE) MG tablet Take 1 tablet (325 mg total) by mouth daily with breakfast. (Patient taking differently: Take 325 mg by mouth 2 (two) times daily with a meal. )  . furosemide (LASIX) 40 MG tablet Take 2 tablets (80 mg total) by mouth 2 (two) times daily.  [name] Kitchen gabapentin (NEURONTIN) 300 MG capsule TAKE 2 CAPSULES BY MOUTH 2 TIMES DAILY.  [name] Kitchen ibuprofen (ADVIL) 800 MG tablet Take 800 mg by mouth every 8 (eight) hours as needed.  . Insulin Lispro Prot & Lispro (HUMALOG MIX 75/25 KWIKPEN) (75-25) 100 UNIT/ML Kwikpen Inject 30 Units into the skin 2 (two) times daily.  . Multiple Vitamins-Minerals (CENTRUM SILVER 50+MEN) TABS Take 1 tablet by mouth daily.  . pantoprazole (PROTONIX) 40 MG tablet TAKE 1 TABLET (40 MG TOTAL) BY MOUTH 2 (TWO) TIMES DAILY.  [name] Kitchen potassium chloride SA (KLOR-CON M20) 20 MEQ tablet Take 1 tablet (20 mEq total) by mouth daily.  . QUEtiapine (SEROQUEL) 200 MG tablet Take 200 mg by mouth at bedtime.   . sacubitril-valsartan (ENTRESTO) 97-103 MG Take 1 tablet by mouth 2 (two) times daily.  . sildenafil (VIAGRA) 100 MG tablet Take 1 tablet (100 mg total) by mouth daily as needed for erectile dysfunction.  [name] Kitchen  spironolactone (ALDACTONE) 25 MG tablet Take 1 tablet (25 mg total) by mouth daily.  [name] Kitchen tetrahydrozoline 0.05 % ophthalmic solution Place 1 drop into both eyes daily.  . TRUEPLUS PEN NEEDLES 31G X 6 MM MISC USE AS DIRECTED  . [DISCONTINUED] carvedilol (COREG) 12.5 MG tablet Take 1 tablet (12.5 mg total) by mouth 2 (two) times daily with a meal.   BP 126/84   Pulse 64   Ht 6\' 2"  (1.88 m)   Wt (!) 139.7 kg (308 lb)   SpO2 96%   BMI 39.54 kg/m   Wt Readings from Last  3 Encounters:  07/28/20 (!) 139.7 kg (308 lb)  07/27/20 (!) 138.3 kg (305 lb)  07/20/20 (!) 140.6 kg (310 lb)   .med Vitals:   07/28/20 1504  BP: 126/84  Pulse: 64  SpO2: 96%   General: NAD Neck: No JVD, no thyromegaly or thyroid nodule.  Lungs: Clear to auscultation bilaterally with normal respiratory effort. CV: Nondisplaced PMI.  Heart regular S1/S2, no S3/S4, no murmur.  No peripheral edema.  No carotid bruit.  Normal pedal pulses.  Abdomen: Soft, nontender, no hepatosplenomegaly, no distention.  Skin: Intact without lesions or rashes.  Neurologic: Alert and oriented x 3.  Psych: Normal affect. Extremities: No clubbing or cyanosis.  HEENT: Normal.   Assessment/Plan: 1. Chronic systolic CHF: Echo in 91/91 with EF 25-30%, echo 5/21 with EF 30% with mildly decreased RV systolic function.  Suspect mixed ischemic/nonischemic cardiomyopathy.  He is not volume overloaded on exam today, NYHA class II symptoms. - Continue Lasix 80 mg bid.  - Continue Entresto 97/103 bid.  - Increase Coreg to 18.75 mg bid.  - Continue spironolactone 25 daily.   - With persistently low EF, should get ICD. Not CRT candidate. Will need to refer to EP for evaluation.  2. CAD: Last cath in 11/20 with occluded OM2 stent, 90% D1, severe diffuse disease in the RCA (no intervention). No recent chest pain.  - No ASA given apixaban use.  - Continue atorvastatin. Good lipids in 1/21.  3. Atrial fibrillation: Paroxysmal.  He is in NSR today. Denies  overt bleeding.  - Continue apixaban 5 mg bid.  4. DMII: Continue home regimen.  5. OSA: Continue CPAP => needs to use more regularly.  6. Upper GI bleeding: Occurred in 7/21.  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  No overt bleeding.    Loralie Champagne 07/29/2020

## 2020-08-02 NOTE — Progress Notes (Signed)
Electrophysiology Office Note:    Date:  08/02/2020   ID:  [name], DOB November 05, 1961, MRN [id number]  PCP:  Azzie Glatter, [facility] Cardiologist:  [name], MD  [facility] Electrophysiologist:  [name], MD   Referring MD: [name], MD   Chief Complaint: Ischemic cardiomyopathy, ICD evaluation  History of Present Illness:    [name] is a 59 y.o. male with a hx of ischemic cardiomyopathy and paroxysmal atrial fibrillation presents to clinic for an evaluation regarding ICD implant at the request of Dr. Aundra Dubin. His medical history is significant for chronic systolic heart failure secondary to coronary artery disease, diabetes, paroxysmal atrial fibrillation on Eliquis, schizophrenia. Recent echocardiogram shows an ejection fraction of 30% with diffuse hypokinesis.  Past Medical History:  Diagnosis Date  . Anxiety   . CAD (coronary artery disease)   . CHF (congestive heart failure) ([place]) 09/2019  . Depression   . Diabetes mellitus   . Erectile dysfunction 11/2019  . H/O right heart catheterization 09/2019  . Hypertension   . Schizophrenia ([place])   . Sleep apnea    uses cpap    Past Surgical History:  Procedure Laterality Date  . CORONARY STENT PLACEMENT    . ENTEROSCOPY N/A 04/08/2020   Procedure: ENTEROSCOPY;  Surgeon: Doran Stabler, MD;  Location: [facility] ENDOSCOPY;  Service: Gastroenterology;  Laterality: N/A;  . ENTEROSCOPY N/A 06/17/2020   Procedure: ENTEROSCOPY;  Surgeon: Irene Shipper, MD;  Location: [facility] ENDOSCOPY;  Service: Endoscopy;  Laterality: N/A;  . HEMOSTASIS CLIP PLACEMENT  04/08/2020   Procedure: HEMOSTASIS CLIP PLACEMENT;  Surgeon: Doran Stabler, MD;  Location: [place];  Service: Gastroenterology;;  . HEMOSTASIS CONTROL  04/08/2020   Procedure: HEMOSTASIS CONTROL;  Surgeon: Doran Stabler, MD;  Location: [facility] ENDOSCOPY;  Service: Gastroenterology;;  . Laretta Alstrom CONTROL  06/17/2020    Procedure: HEMOSTASIS CONTROL;  Surgeon: Irene Shipper, MD;  Location: [facility] ENDOSCOPY;  Service: Endoscopy;;  . HERNIA REPAIR    . RIGHT/LEFT HEART CATH AND CORONARY ANGIOGRAPHY N/A 10/14/2019   Procedure: RIGHT/LEFT HEART CATH AND CORONARY ANGIOGRAPHY;  Surgeon: Belva Crome, MD;  Location: [place] CV LAB;  Service: Cardiovascular;  Laterality: N/A;    Current Medications: No outpatient medications have been marked as taking for the 08/03/20 encounter (Appointment) with [name], MD.     Allergies:   Patient has no known allergies.   Social History   Socioeconomic History  . Marital status: Legally Separated    Spouse name: Not on file  . Number of children: Not on file  . Years of education: Not on file  . Highest education level: Not on file  Occupational History  . Not on file  Tobacco Use  . 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 Smokeless tobacco: Never Used  Vaping Use  . Vaping Use: Never used  Substance and Sexual Activity  . Alcohol use: Yes    Alcohol/week: 2.0 standard drinks    Types: 2 Cans of beer per week  . Drug use: No  . Sexual activity: Yes  Other Topics Concern  . Not on file  Social History Narrative  . Not on file   Social Determinants of Health   Financial Resource Strain: Low Risk   . Difficulty of Paying Living Expenses: Not very hard  Food Insecurity: No Food Insecurity  . Worried About Charity fundraiser in the Last Year: Never true  .  Ran Out of Food in the Last Year: Never true  Transportation Needs: Unmet Transportation Needs  . Lack of Transportation (Medical): Yes  . Lack of Transportation (Non-Medical): Yes  Physical Activity:   . Days of Exercise per Week: Not on file  . Minutes of Exercise per Session: Not on file  Stress:   . Feeling of Stress : Not on file  Social Connections:   . Frequency of Communication with Friends and Family: Not on file  . Frequency of Social Gatherings with Friends and Family: Not on file  .  Attends Religious Services: Not on file  . Active Member of Clubs or Organizations: Not on file  . Attends Archivist Meetings: Not on file  . Marital Status: Not on file     Family History: The patient's family history includes Heart failure in his mother; Mental illness in his sister and sister.  ROS:   Please see the history of present illness.    All other systems reviewed and are negative.  EKGs/Labs/Other Studies Reviewed:    The following studies were reviewed today: Echo, ECG  04/05/2020 Echo 1. Left ventricular ejection fraction, by estimation, is 30%. The left  ventricle has moderate to severely decreased function. The left ventricle  demonstrates global hypokinesis. There is mild left ventricular  hypertrophy. Left ventricular diastolic  parameters are consistent with Grade II diastolic dysfunction  (pseudonormalization).  2. Right ventricular systolic function is mildly reduced. The right  ventricular size is mildly enlarged. There is mildly elevated pulmonary  artery systolic pressure. The estimated right ventricular systolic  pressure is 14.4 mmHg.  3. Left atrial size was mildly dilated.  4. Right atrial size was mildly dilated.  5. The mitral valve is normal in structure. Trivial mitral valve  regurgitation. No evidence of mitral stenosis.  6. The aortic valve is tricuspid. Aortic valve regurgitation is not  visualized. No aortic stenosis is present.  7. The inferior vena cava is dilated in size with <50% respiratory  variability, suggesting right atrial pressure of 15 mmHg.  EKG:  The ekg ordered today demonstrates sinus rhythm.  Recent Labs: 03/15/2020: NT-Pro BNP 231 04/05/2020: ALT 26 06/15/2020: B Natriuretic Peptide 1,620.5; Magnesium 2.1 06/29/2020: Hemoglobin 11.1; Platelets 437 07/19/2020: TSH 5.320 07/28/2020: BUN 21; Creatinine, Ser 1.32; Potassium 4.2; Sodium 140  Recent Lipid Panel    Component Value Date/Time   CHOL 116  07/19/2020 0940   TRIG 128 07/19/2020 0940   HDL 42 07/19/2020 0940   CHOLHDL 2.8 07/19/2020 0940   CHOLHDL 3.6 03/29/2015 1402   VLDL 24 03/29/2015 1402   LDLCALC 51 07/19/2020 0940    Physical Exam:    VS:  There were no vitals taken for this visit.    Wt Readings from Last 3 Encounters:  07/28/20 (!) 308 lb (139.7 kg)  07/27/20 (!) 305 lb (138.3 kg)  07/20/20 (!) 310 lb (140.6 kg)     GEN:  Well nourished, well developed in no acute distress, obese HEENT: Normal NECK: No JVD; No carotid bruits LYMPHATICS: No lymphadenopathy CARDIAC: RRR, no murmurs, rubs, gallops RESPIRATORY:  Clear to auscultation without rales, wheezing or rhonchi  ABDOMEN: Soft, non-tender, non-distended MUSCULOSKELETAL:  No edema; No deformity  SKIN: Warm and dry NEUROLOGIC:  Alert and oriented x 3 PSYCHIATRIC:  Normal affect   ASSESSMENT:    1. Chronic systolic congestive heart failure ([place])   2. PAF (paroxysmal atrial fibrillation) ([facility])    PLAN:    In order of problems  listed above:  1. Chronic systolic heart failure, ejection fraction 30% NYHA class II-III symptoms on stable optimal medical therapy. Recommend primary prevention ICD. Patient has a narrow QRS. Given the history of atrial arrhythmias we will plan for a dual-chamber ICD. Risks, benefits, alternatives were discussed with the patient during today's visit and he wishes to proceed.  The patient has an  ischemic CM (EF 30%), NYHA Class III CHF, and CAD.  He is referred by Dr Aundra Dubin for risk stratification of sudden death and consideration of ICD implantation.  At this time, he meets criteria for ICD implantation for primary prevention of sudden death.  I have had a thorough discussion with the patient reviewing options.  The patient and their family (if available) have had opportunities to ask questions and have them answered. The patient and I have decided together through a shared decision making process to implant ICD at this time.     Risks, benefits, alternatives to ICD implantation were discussed in detail with the patient today. The patient understands that the risks include but are not limited to bleeding, infection, pneumothorax, perforation, tamponade, vascular damage, renal failure, MI, stroke, death, inappropriate shocks, and lead dislodgement and wishes to proceed.  We will therefore schedule device implantation at the next available time.  2. Paroxysmal atrial fibrillation Continue apixaban. Will use a dual chamber PPM for atrial diagnostics.   Medication Adjustments/Labs and Tests Ordered: Current medicines are reviewed at length with the patient today.  Concerns regarding medicines are outlined above.  No orders of the defined types were placed in this encounter.  No orders of the defined types were placed in this encounter.   There are no Patient Instructions on file for this visit.   Signed, Lars Mage, MD, [facility]  08/02/2020 9:56 PM    Electrophysiology [facility]     Anticoagulation instructions: Pt to hold Eliquis for 4 dose(s) prior to procedure and we will plan to resume 5 days after the procedure unless otherwise instructed after surgery.  Medication instructions morning of: The patient should take their medications the morning of the procedure, except for any diuretic (lasix, torsemide, or bumex).   Discharge: Our plan will be to discharge the patient same day after a period of observation

## 2020-08-03 ENCOUNTER — Ambulatory Visit (INDEPENDENT_AMBULATORY_CARE_PROVIDER_SITE_OTHER): Payer: Medicare Other | Admitting: Cardiology

## 2020-08-03 ENCOUNTER — Other Ambulatory Visit: Payer: Self-pay

## 2020-08-03 ENCOUNTER — Other Ambulatory Visit (HOSPITAL_COMMUNITY): Payer: Self-pay

## 2020-08-03 ENCOUNTER — Encounter: Payer: Self-pay | Admitting: Cardiology

## 2020-08-03 VITALS — BP 110/66 | HR 75 | Ht 74.0 in | Wt 317.4 lb

## 2020-08-03 DIAGNOSIS — I480 Paroxysmal atrial fibrillation: Secondary | ICD-10-CM

## 2020-08-03 DIAGNOSIS — I5022 Chronic systolic (congestive) heart failure: Secondary | ICD-10-CM | POA: Diagnosis not present

## 2020-08-03 NOTE — Patient Instructions (Addendum)
Medication Instructions:  Your physician recommends that you continue on your current medications as directed. Please refer to the Current Medication list given to you today.  Labwork: None ordered.  Testing/Procedures: None ordered.  Follow-Up:  SEE INSTRUCTION LETTER  Any Other Special Instructions Will Be Listed Below (If Applicable).  If you need a refill on your cardiac medications before your next appointment, please call your pharmacy.    Cardioverter Defibrillator Implantation  An implantable cardioverter defibrillator (ICD) is a small device that is placed under the skin in the chest or abdomen. An ICD consists of a battery, a small computer (pulse generator), and wires (leads) that go into the heart. An ICD is used to detect and correct two types of dangerous irregular heartbeats (arrhythmias):  A rapid heart rhythm (tachycardia).  An arrhythmia in which the lower chambers of the heart (ventricles) contract in an uncoordinated way (fibrillation). When an ICD detects tachycardia, it sends a low-energy shock to the heart to restore the heartbeat to normal (cardioversion). This signal is usually painless. If cardioversion does not work or if the ICD detects fibrillation, it delivers a high-energy shock to the heart (defibrillation) to restart the heart. This shock may feel like a strong jolt in the chest. Your health care provider may prescribe an ICD if:  You have had an arrhythmia that originated in the ventricles.  Your heart has been damaged by a disease or heart condition. Sometimes, ICDs are programmed to act as a device called a pacemaker. Pacemakers can be used to treat a slow heartbeat (bradycardia) or tachycardia by taking over the heart rate with electrical impulses. Tell a health care provider about:  Any allergies you have.  All medicines you are taking, including vitamins, herbs, eye drops, creams, and over-the-counter medicines.  Any problems you or family  members have had with anesthetic medicines.  Any blood disorders you have.  Any surgeries you have had.  Any medical conditions you have.  Whether you are pregnant or may be pregnant. What are the risks? Generally, this is a safe procedure. However, problems may occur, including:  Swelling, bleeding, or bruising.  Infection.  Blood clots.  Damage to other structures or organs, such as nerves, blood vessels, or the heart.  Allergic reactions to medicines used during the procedure. What happens before the procedure? Staying hydrated Follow instructions from your health care provider about hydration, which may include:  Up to 2 hours before the procedure - you may continue to drink clear liquids, such as water, clear fruit juice, black coffee, and plain tea. Eating and drinking restrictions Follow instructions from your health care provider about eating and drinking, which may include:  8 hours before the procedure - stop eating heavy meals or foods such as meat, fried foods, or fatty foods.  6 hours before the procedure - stop eating light meals or foods, such as toast or cereal.  6 hours before the procedure - stop drinking milk or drinks that contain milk.  2 hours before the procedure - stop drinking clear liquids. Medicine Ask your health care provider about:  Changing or stopping your normal medicines. This is important if you take diabetes medicines or blood thinners.  Taking medicines such as aspirin and ibuprofen. These medicines can thin your blood. Do not take these medicines before your procedure if your doctor tells you not to. Tests  You may have blood tests.  You may have a test to check the electrical signals in your heart (electrocardiogram, ECG).    You may have imaging tests, such as a chest X-ray. General instructions  For 24 hours before the procedure, stop using products that contain nicotine or tobacco, such as cigarettes and e-cigarettes. If you  need help quitting, ask your health care provider.  Plan to have someone take you home from the hospital or clinic.  You may be asked to shower with a germ-killing soap. What happens during the procedure?  To reduce your risk of infection: ? Your health care team will wash or sanitize their hands. ? Your skin will be washed with soap. ? Hair may be removed from the surgical area.  Small monitors will be put on your body. They will be used to check your heart, blood pressure, and oxygen level.  An IV tube will be inserted into one of your veins.  You will be given one or more of the following: ? A medicine to help you relax (sedative). ? A medicine to numb the area (local anesthetic). ? A medicine to make you fall asleep (general anesthetic).  Leads will be guided through a blood vessel into your heart and attached to your heart muscles. Depending on the ICD, the leads may go into one ventricle or they may go into both ventricles and into an upper chamber of the heart. An X-ray machine (fluoroscope) will be usedto help guide the leads.  A small incision will be made to create a deep pocket under your skin.  The pulse generator will be placed into the pocket.  The ICD will be tested.  The incision will be closed with stitches (sutures), skin glue, or staples.  A bandage (dressing) will be placed over the incision. This procedure may vary among health care providers and hospitals. What happens after the procedure?  Your blood pressure, heart rate, breathing rate, and blood oxygen level will be monitored often until the medicines you were given have worn off.  A chest X-ray will be taken to check that the ICD is in the right place.  You will need to stay in the hospital for 1-2 days so your health care provider can make sure your ICD is working.  Do not drive for 24 hours if you received a sedative. Ask your health care provider when it is safe for you to drive.  You may be  given an identification card explaining that you have an ICD. Summary  An implantable cardioverter defibrillator (ICD) is a small device that is placed under the skin in the chest or abdomen. It is used to detect and correct dangerous irregular heartbeats (arrhythmias).  An ICD consists of a battery, a small computer (pulse generator), and wires (leads) that go into the heart.  When an ICD detects rapid heart rhythm (tachycardia), it sends a low-energy shock to the heart to restore the heartbeat to normal (cardioversion). If cardioversion does not work or if the ICD detects uncoordinated heart contractions (fibrillation), it delivers a high-energy shock to the heart (defibrillation) to restart the heart.  You will need to stay in the hospital for 1-2 days to make sure your ICD is working. This information is not intended to replace advice given to you by your health care provider. Make sure you discuss any questions you have with your health care provider. Document Revised: 10/26/2017 Document Reviewed: 11/22/2016 Elsevier Patient Education  2020 Elsevier Inc.  

## 2020-08-03 NOTE — Progress Notes (Signed)
Paramedicine Encounter    Patient ID: [name], male    DOB: May 17, 1961, 59 y.o.   MRN: [id number]   Patient Care Team: Azzie Glatter, FNP as PCP - General (Family Medicine) [name], MD as PCP - Cardiology (Cardiology) [name], MD as PCP - Advanced Heart Failure (Cardiology) [name], MD as PCP - Electrophysiology (Cardiology) Valente David, RN as [place] Management  Patient Active Problem List   Diagnosis Date Noted   Acute blood loss anemia    Angiodysplasia of stomach    Chronic anticoagulation    CHF exacerbation ([place]) 06/15/2020   AKI (acute kidney injury) ([place]) 06/15/2020   CKD (chronic kidney disease), stage III 06/15/2020   Anemia due to chronic blood loss    Gastric AVM    Angiodysplasia of small intestine    Acute on chronic systolic (congestive) heart failure ([place]) 04/05/2020   Anemia 04/05/2020   PAF (paroxysmal atrial fibrillation) ([place]) 04/05/2020   OSA on CPAP 04/05/2020   Syncope and collapse 04/05/2020   Type 2 diabetes mellitus with hyperglycemia, with long-term current use of insulin ([place]) 12/03/2019   Diabetic polyneuropathy associated with type 2 diabetes mellitus ([place]) 12/03/2019   Hyperglycemia 12/03/2019   History of hyperglycemia 12/03/2019   Erectile dysfunction 12/03/2019   CHF (congestive heart failure) ([place]) 10/11/2019   Paranoid schizophrenia, chronic condition ([place]) 01/26/2015   Severe recurrent major depressive disorder with psychotic features ([place]) 01/26/2015   GAD (generalized anxiety disorder) 01/26/2015   OCD (obsessive compulsive disorder) 01/26/2015   Panic disorder without agoraphobia 01/26/2015   Insomnia 01/26/2015    Current Outpatient Medications:    acetaminophen (TYLENOL) 500 MG tablet, Take 1 tablet (500 mg total) by mouth every 6 (six) hours as needed for mild pain or moderate pain., Disp: 60 tablet, Rfl: 3   apixaban (ELIQUIS) 5 MG TABS  tablet, Take 1 tablet (5 mg total) by mouth 2 (two) times daily., Disp: 180 tablet, Rfl: 3   atorvastatin (LIPITOR) 40 MG tablet, Take 1 tablet (40 mg total) by mouth daily at 6 PM., Disp: 90 tablet, Rfl: 3   blood glucose meter kit and supplies KIT, Dispense based on patient and insurance preference. Use up to four times daily as directed. (FOR ICD-9 250.00, 250.01). (Patient taking differently: 1 each by Other route See admin instructions. Dispense based on patient and insurance preference. Use up to four times daily as directed. (FOR ICD-9 250.00, 250.01).), Disp: 1 each, Rfl: 0   buPROPion (WELLBUTRIN SR) 100 MG 12 hr tablet, Take 1 tablet (100 mg total) by mouth 2 (two) times daily., Disp: 60 tablet, Rfl: 6   carvedilol (COREG) 12.5 MG tablet, Take 1.5 tablets (18.75 mg total) by mouth 2 (two) times daily with a meal., Disp: 90 tablet, Rfl: 6   cyclobenzaprine (FLEXERIL) 10 MG tablet, Take 10 mg by mouth 3 (three) times daily as needed for muscle spasms. , Disp: , Rfl:    ferrous sulfate 325 (65 FE) MG tablet, Take 1 tablet (325 mg total) by mouth daily with breakfast. (Patient taking differently: Take 325 mg by mouth 2 (two) times daily with a meal. ), Disp: 30 tablet, Rfl: 3   furosemide (LASIX) 40 MG tablet, Take 2 tablets (80 mg total) by mouth 2 (two) times daily., Disp: 360 tablet, Rfl: 3   gabapentin (NEURONTIN) 300 MG capsule, TAKE 2 CAPSULES BY MOUTH 2 TIMES DAILY., Disp: 120 capsule, Rfl: 3   ibuprofen (ADVIL) 800  MG tablet, Take 800 mg by mouth every 8 (eight) hours as needed., Disp: , Rfl:    Insulin Lispro Prot & Lispro (HUMALOG MIX 75/25 KWIKPEN) (75-25) 100 UNIT/ML Kwikpen, Inject 30 Units into the skin 2 (two) times daily., Disp: 15 mL, Rfl: 11   Multiple Vitamins-Minerals (CENTRUM SILVER 50+MEN) TABS, Take 1 tablet by mouth daily., Disp: , Rfl:    pantoprazole (PROTONIX) 40 MG tablet, TAKE 1 TABLET (40 MG TOTAL) BY MOUTH 2 (TWO) TIMES DAILY., Disp: 60 tablet, Rfl: 1    potassium chloride SA (KLOR-CON M20) 20 MEQ tablet, Take 1 tablet (20 mEq total) by mouth daily., Disp: 30 tablet, Rfl: 0   QUEtiapine (SEROQUEL) 200 MG tablet, Take 200 mg by mouth at bedtime. , Disp: , Rfl:    sacubitril-valsartan (ENTRESTO) 97-103 MG, Take 1 tablet by mouth 2 (two) times daily., Disp: 60 tablet, Rfl: 5   sildenafil (VIAGRA) 100 MG tablet, Take 1 tablet (100 mg total) by mouth daily as needed for erectile dysfunction., Disp: 10 tablet, Rfl: 6   spironolactone (ALDACTONE) 25 MG tablet, Take 1 tablet (25 mg total) by mouth daily., Disp: 30 tablet, Rfl: 11   tetrahydrozoline 0.05 % ophthalmic solution, Place 1 drop into both eyes daily., Disp: , Rfl:    TRUEPLUS PEN NEEDLES 31G X 6 MM MISC, USE AS DIRECTED, Disp: 100 each, Rfl: 0 No Known Allergies   Social History   Socioeconomic History   Marital status: Legally Separated    Spouse name: Not on file   Number of children: Not on file   Years of education: Not on file   Highest education level: Not on file  Occupational History   Not on file  Tobacco Use   Smoking status: Former Smoker   Smokeless tobacco: Never Used  Vaping Use   Vaping Use: Never used  Substance and Sexual Activity   Alcohol use: Yes    Alcohol/week: 2.0 standard drinks    Types: 2 Cans of beer per week   Drug use: No   Sexual activity: Yes  Other Topics Concern   Not on file  Social History Narrative   Not on file   Social Determinants of Health   Financial Resource Strain: Low Risk    Difficulty of Paying Living Expenses: Not very hard  Food Insecurity: No Food Insecurity   Worried About Charity fundraiser in the Last Year: Never true   Arboriculturist in the Last Year: Never true  Transportation Needs: Unmet Transportation Needs   Lack of Transportation (Medical): Yes   Lack of Transportation (Non-Medical): Yes  Physical Activity:    Days of Exercise per Week: Not on file   Minutes of Exercise per  Session: Not on file  Stress:    Feeling of Stress : Not on file  Social Connections:    Frequency of Communication with Friends and Family: Not on file   Frequency of Social Gatherings with Friends and Family: Not on file   Attends Religious Services: Not on file   Active Member of Clubs or Organizations: Not on file   Attends Archivist Meetings: Not on file   Marital Status: Not on file  Intimate Partner Violence:    Fear of Current or Ex-Partner: Not on file   Emotionally Abused: Not on file   Physically Abused: Not on file   Sexually Abused: Not on file    Physical Exam Vitals reviewed.  Constitutional:  Appearance: He is normal weight.  HENT:     Head: Normocephalic.     Nose: Nose normal.     Mouth/Throat:     Mouth: Mucous membranes are moist.     Pharynx: Oropharynx is clear.  Eyes:     Pupils: Pupils are equal, round, and reactive to light.  Cardiovascular:     Rate and Rhythm: Normal rate and regular rhythm.     Pulses: Normal pulses.     Heart sounds: Normal heart sounds.  Pulmonary:     Effort: Pulmonary effort is normal.     Breath sounds: Normal breath sounds.  Abdominal:     General: Abdomen is flat.     Palpations: Abdomen is soft.  Musculoskeletal:        General: Normal range of motion.     Cervical back: Normal range of motion.     Right lower leg: No edema.     Left lower leg: No edema.  Skin:    General: Skin is warm and dry.     Capillary Refill: Capillary refill takes less than 2 seconds.  Neurological:     Mental Status: He is alert. Mental status is at baseline.  Psychiatric:        Mood and Affect: Mood normal.     Arrived for home visit for [facility] [employer] who was alert and oriented reporting he is feeling good, denied any shortness of breath, chest pain, dizziness. [name] has been compliant with medications over the last week. Medications were verified and confirmed. Pill box filled accordingly. [name] reports he has  appointment with Oral Surgeon at the end of the month on the 21st. I will assist with paperwork for same.  I will also assist with [employer] paperwork for insulin sent by [place]. [name] agreed to visit in one week.   Refills: Gabapentin Spironolactone Wellbutrin Pantoprazole        Future Appointments  Date Time Provider [place]  08/03/2020  4:40 PM [name], MD [facility]  08/19/2020 10:30 AM Valente David, RN [facility] None  11/02/2020  1:40 PM [name], MD [facility] None  01/19/2021  9:00 AM Azzie Glatter, FNP [facility] None     ACTION: Home visit completed Next visit planned for one week

## 2020-08-04 MED FILL — BUPROPION HCL SR 100 MG TAB: 100 | 30 days supply | Qty: 60 | Fill #3

## 2020-08-04 MED FILL — SPIRONOLACTONE 25 MG TABLET: 25 | 30 days supply | Qty: 30 | Fill #4

## 2020-08-04 MED FILL — PANTOPRAZOLE SOD DR 40 MG T: 40 | 30 days supply | Qty: 60 | Fill #1

## 2020-08-04 MED FILL — GABAPENTIN 300 MG CAPSULE: 300 | 30 days supply | Qty: 120 | Fill #1

## 2020-08-09 ENCOUNTER — Other Ambulatory Visit (HOSPITAL_COMMUNITY): Payer: Self-pay

## 2020-08-09 NOTE — Progress Notes (Signed)
Paramedicine Encounter    Patient ID: [name], male    DOB: 06-Mar-1961, 59 y.o.   MRN: [id number]   Patient Care Team: Azzie Glatter, FNP as PCP - General (Family Medicine) [name], MD as PCP - Cardiology (Cardiology) [name], MD as PCP - Advanced Heart Failure (Cardiology) [name], MD as PCP - Electrophysiology (Cardiology) Valente David, RN as [place] Management  Patient Active Problem List   Diagnosis Date Noted  . Acute blood loss anemia   . Angiodysplasia of stomach   . Chronic anticoagulation   . CHF exacerbation ([place]) 06/15/2020  . AKI (acute kidney injury) ([place]) 06/15/2020  . CKD (chronic kidney disease), stage III 06/15/2020  . Anemia due to chronic blood loss   . Gastric AVM   . Angiodysplasia of small intestine   . Acute on chronic systolic (congestive) heart failure ([place]) 04/05/2020  . Anemia 04/05/2020  . PAF (paroxysmal atrial fibrillation) ([place]) 04/05/2020  . OSA on CPAP 04/05/2020  . Syncope and collapse 04/05/2020  . Type 2 diabetes mellitus with hyperglycemia, with long-term current use of insulin ([place]) 12/03/2019  . Diabetic polyneuropathy associated with type 2 diabetes mellitus ([place]) 12/03/2019  . Hyperglycemia 12/03/2019  . History of hyperglycemia 12/03/2019  . Erectile dysfunction 12/03/2019  . CHF (congestive heart failure) ([place]) 10/11/2019  . Paranoid schizophrenia, chronic condition ([place]) 01/26/2015  . Severe recurrent major depressive disorder with psychotic features ([place]) 01/26/2015  . GAD (generalized anxiety disorder) 01/26/2015  . OCD (obsessive compulsive disorder) 01/26/2015  . Panic disorder without agoraphobia 01/26/2015  . Insomnia 01/26/2015    Current Outpatient Medications:  .  acetaminophen (TYLENOL) 500 MG tablet, Take 1 tablet (500 mg total) by mouth every 6 (six) hours as needed for mild pain or moderate pain., Disp: 60 tablet, Rfl: 3 .  apixaban (ELIQUIS) 5 MG TABS  tablet, Take 1 tablet (5 mg total) by mouth 2 (two) times daily., Disp: 180 tablet, Rfl: 3 .  atorvastatin (LIPITOR) 40 MG tablet, Take 1 tablet (40 mg total) by mouth daily at 6 PM., Disp: 90 tablet, Rfl: 3 .  blood glucose meter kit and supplies KIT, Dispense based on patient and insurance preference. Use up to four times daily as directed. (FOR ICD-9 250.00, 250.01)., Disp: 1 each, Rfl: 0 .  buPROPion (WELLBUTRIN SR) 100 MG 12 hr tablet, Take 1 tablet (100 mg total) by mouth 2 (two) times daily., Disp: 60 tablet, Rfl: 6 .  carvedilol (COREG) 12.5 MG tablet, Take 1.5 tablets (18.75 mg total) by mouth 2 (two) times daily with a meal., Disp: 90 tablet, Rfl: 6 .  cyclobenzaprine (FLEXERIL) 10 MG tablet, Take 10 mg by mouth 3 (three) times daily as needed for muscle spasms.  (Patient not taking: Reported on 08/03/2020), Disp: , Rfl:  .  ferrous sulfate 325 (65 FE) MG tablet, Take 1 tablet (325 mg total) by mouth daily with breakfast., Disp: 30 tablet, Rfl: 3 .  furosemide (LASIX) 40 MG tablet, Take 2 tablets (80 mg total) by mouth 2 (two) times daily., Disp: 360 tablet, Rfl: 3 .  gabapentin (NEURONTIN) 300 MG capsule, TAKE 2 CAPSULES BY MOUTH 2 TIMES DAILY., Disp: 120 capsule, Rfl: 3 .  ibuprofen (ADVIL) 800 MG tablet, Take 800 mg by mouth every 8 (eight) hours as needed. (Patient not taking: Reported on 08/03/2020), Disp: , Rfl:  .  Insulin Lispro Prot & Lispro (HUMALOG MIX 75/25 KWIKPEN) (75-25) 100 UNIT/ML Kwikpen, Inject 30  Units into the skin 2 (two) times daily., Disp: 15 mL, Rfl: 11 .  Multiple Vitamins-Minerals (CENTRUM SILVER 50+MEN) TABS, Take 1 tablet by mouth daily., Disp: , Rfl:  .  pantoprazole (PROTONIX) 40 MG tablet, TAKE 1 TABLET (40 MG TOTAL) BY MOUTH 2 (TWO) TIMES DAILY., Disp: 60 tablet, Rfl: 1 .  potassium chloride SA (KLOR-CON M20) 20 MEQ tablet, Take 1 tablet (20 mEq total) by mouth daily., Disp: 30 tablet, Rfl: 0 .  QUEtiapine (SEROQUEL) 200 MG tablet, Take 200 mg by mouth at bedtime.  , Disp: , Rfl:  .  sacubitril-valsartan (ENTRESTO) 97-103 MG, Take 1 tablet by mouth 2 (two) times daily., Disp: 60 tablet, Rfl: 5 .  sildenafil (VIAGRA) 100 MG tablet, Take 1 tablet (100 mg total) by mouth daily as needed for erectile dysfunction. (Patient not taking: Reported on 08/03/2020), Disp: 10 tablet, Rfl: 6 .  spironolactone (ALDACTONE) 25 MG tablet, Take 1 tablet (25 mg total) by mouth daily., Disp: 30 tablet, Rfl: 11 .  tetrahydrozoline 0.05 % ophthalmic solution, Place 1 drop into both eyes daily., Disp: , Rfl:  .  TRUEPLUS PEN NEEDLES 31G X 6 MM MISC, USE AS DIRECTED, Disp: 100 each, Rfl: 0 No Known Allergies   Social History   Socioeconomic History  . Marital status: Legally Separated    Spouse name: Not on file  . Number of children: Not on file  . Years of education: Not on file  . Highest education level: Not on file  Occupational History  . Not on file  Tobacco Use  . 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 Smokeless tobacco: Never Used  Vaping Use  . Vaping Use: Never used  Substance and Sexual Activity  . Alcohol use: Yes    Alcohol/week: 2.0 standard drinks    Types: 2 Cans of beer per week  . Drug use: No  . Sexual activity: Yes  Other Topics Concern  . Not on file  Social History Narrative  . Not on file   Social Determinants of Health   Financial Resource Strain: Low Risk   . Difficulty of Paying Living Expenses: Not very hard  Food Insecurity: No Food Insecurity  . Worried About Charity fundraiser in the Last Year: Never true  . Ran Out of Food in the Last Year: Never true  Transportation Needs: Unmet Transportation Needs  . Lack of Transportation (Medical): Yes  . Lack of Transportation (Non-Medical): Yes  Physical Activity:   . Days of Exercise per Week: Not on file  . Minutes of Exercise per Session: Not on file  Stress:   . Feeling of Stress : Not on file  Social Connections:   . Frequency of Communication with Friends and Family: Not on file   . Frequency of Social Gatherings with Friends and Family: Not on file  . Attends Religious Services: Not on file  . Active Member of Clubs or Organizations: Not on file  . Attends Archivist Meetings: Not on file  . Marital Status: Not on file  Intimate Partner Violence:   . Fear of Current or Ex-Partner: Not on file  . Emotionally Abused: Not on file  . Physically Abused: Not on file  . Sexually Abused: Not on file    Physical Exam Vitals reviewed.  Constitutional:      Appearance: He is normal weight.  HENT:     Head: Normocephalic.     Nose: Nose normal.     Mouth/Throat:  Mouth: Mucous membranes are moist.     Pharynx: Oropharynx is clear.  Eyes:     Pupils: Pupils are equal, round, and reactive to light.  Cardiovascular:     Rate and Rhythm: Normal rate and regular rhythm.     Pulses: Normal pulses.     Heart sounds: Normal heart sounds.  Pulmonary:     Effort: Pulmonary effort is normal.     Breath sounds: Normal breath sounds.  Abdominal:     Palpations: Abdomen is soft.  Musculoskeletal:        General: Normal range of motion.     Cervical back: Normal range of motion.     Right lower leg: Edema present.     Left lower leg: Edema present.  Skin:    General: Skin is warm and dry.     Capillary Refill: Capillary refill takes less than 2 seconds.  Neurological:     Mental Status: He is alert. Mental status is at baseline.  Psychiatric:        Mood and Affect: Mood normal.    Arrived for home visit for [name] who was alert and oriented who reports he felt like his face was swelling similar to last time when he was retaining fluid. I assessed same and [name] noted to have some swelling in his legs but not pitting. No JVD and lung sounds clear. Vitals assessed. Meds verified and confirmed. [name] was compliant with meds over the last week. Pill box filled accordingly. [name] was educated on diet and to reduce his sodium and fluid intake. Chris understood.  Home visit complete. I will see [name] in one week.   Refills: Quietapine Atorvastatin        Future Appointments  Date Time Provider [place]  08/19/2020 10:30 AM Valente David, RN [facility] None  09/01/2020 10:00 AM [facility]  09/01/2020 11:00 AM [facility] None  09/14/2020  2:00 PM [facility]ICE 1 [facility]  11/02/2020  1:40 PM [name], MD [facility] None  12/08/2020  1:15 PM [name], MD [facility]  01/19/2021  9:00 AM Azzie Glatter, FNP [facility] None     ACTION: Home visit completed Next visit planned for one week

## 2020-08-10 ENCOUNTER — Other Ambulatory Visit: Payer: Self-pay | Admitting: Family Medicine

## 2020-08-10 DIAGNOSIS — N529 Male erectile dysfunction, unspecified: Secondary | ICD-10-CM

## 2020-08-10 DIAGNOSIS — F419 Anxiety disorder, unspecified: Secondary | ICD-10-CM

## 2020-08-10 MED ORDER — QUETIAPINE FUMARATE 200 MG PO TABS: 200.0000 mg | ORAL_TABLET | Freq: Every day | ORAL | 6 refills | Status: DC

## 2020-08-10 MED ORDER — SILDENAFIL CITRATE 100 MG PO TABS: 100.0000 mg | ORAL_TABLET | Freq: Every day | ORAL | 2 refills | Status: DC | PRN

## 2020-08-10 MED FILL — ATORVASTATIN CALCIUM 40 MG: 40 | 90 days supply | Qty: 90 | Fill #1

## 2020-08-11 MED FILL — QUETIAPINE FUMARATE 200 MG: 200 | 30 days supply | Qty: 30 | Fill #0

## 2020-08-12 ENCOUNTER — Telehealth (HOSPITAL_COMMUNITY): Payer: Self-pay | Admitting: Licensed Clinical Social Worker

## 2020-08-12 NOTE — Telephone Encounter (Signed)
CSW informed by [employer] that pt needs [employer] benefit award letter in order to move forward with assistance for insulin.  CSW able to call SSA and payee and have faxed to our office.  CSW sent to paramedic who will help get awards letter turned in.  Will continue to follow and assist as needed  [name], [facility] Desk#: [phone] Cell#: [phone]

## 2020-08-17 ENCOUNTER — Other Ambulatory Visit (HOSPITAL_COMMUNITY): Payer: Self-pay

## 2020-08-17 MED FILL — ATORVASTATIN CALCIUM 40 MG: 40 | 90 days supply | Qty: 90 | Fill #1

## 2020-08-17 MED FILL — QUETIAPINE FUMARATE 200 MG: 200 | 30 days supply | Qty: 30 | Fill #0

## 2020-08-17 NOTE — Progress Notes (Signed)
Paramedicine Encounter    Patient ID: [name], male    DOB: 1961/02/01, 59 y.o.   MRN: [id number]   Patient Care Team: Azzie Glatter, FNP as PCP - General (Family Medicine) [name], MD as PCP - Cardiology (Cardiology) [name], MD as PCP - Advanced Heart Failure (Cardiology) [name], MD as PCP - Electrophysiology (Cardiology) Valente David, RN as [place] [facility]  Patient Active Problem List   Diagnosis Date Noted  . Acute blood loss anemia   . Angiodysplasia of stomach   . Chronic anticoagulation   . CHF exacerbation ([place]) 06/15/2020  . AKI (acute kidney injury) ([place]) 06/15/2020  . CKD (chronic kidney disease), stage III 06/15/2020  . Anemia due to chronic blood loss   . Gastric AVM   . Angiodysplasia of small intestine   . Acute on chronic systolic (congestive) heart failure ([place]) 04/05/2020  . Anemia 04/05/2020  . PAF (paroxysmal atrial fibrillation) ([place]) 04/05/2020  . OSA on CPAP 04/05/2020  . Syncope and collapse 04/05/2020  . Type 2 diabetes mellitus with hyperglycemia, with long-term current use of insulin ([place]) 12/03/2019  . Diabetic polyneuropathy associated with type 2 diabetes mellitus ([place]) 12/03/2019  . Hyperglycemia 12/03/2019  . History of hyperglycemia 12/03/2019  . Erectile dysfunction 12/03/2019  . CHF (congestive heart failure) ([place]) 10/11/2019  . Paranoid schizophrenia, chronic condition ([place]) 01/26/2015  . Severe recurrent major depressive disorder with psychotic features ([place]) 01/26/2015  . GAD (generalized anxiety disorder) 01/26/2015  . OCD (obsessive compulsive disorder) 01/26/2015  . Panic disorder without agoraphobia 01/26/2015  . Insomnia 01/26/2015    Current Outpatient Medications:  .  acetaminophen (TYLENOL) 500 MG tablet, Take 1 tablet (500 mg total) by mouth every 6 (six) hours as needed for mild pain or moderate pain., Disp: 60 tablet, Rfl: 3 .  apixaban (ELIQUIS) 5 MG TABS  tablet, Take 1 tablet (5 mg total) by mouth 2 (two) times daily., Disp: 180 tablet, Rfl: 3 .  atorvastatin (LIPITOR) 40 MG tablet, Take 1 tablet (40 mg total) by mouth daily at 6 PM., Disp: 90 tablet, Rfl: 3 .  blood glucose meter kit and supplies KIT, Dispense based on patient and insurance preference. Use up to four times daily as directed. (FOR ICD-9 250.00, 250.01)., Disp: 1 each, Rfl: 0 .  buPROPion (WELLBUTRIN SR) 100 MG 12 hr tablet, Take 1 tablet (100 mg total) by mouth 2 (two) times daily., Disp: 60 tablet, Rfl: 6 .  carvedilol (COREG) 12.5 MG tablet, Take 1.5 tablets (18.75 mg total) by mouth 2 (two) times daily with a meal., Disp: 90 tablet, Rfl: 6 .  cyclobenzaprine (FLEXERIL) 10 MG tablet, Take 10 mg by mouth 3 (three) times daily as needed for muscle spasms.  (Patient not taking: Reported on 08/03/2020), Disp: , Rfl:  .  ferrous sulfate 325 (65 FE) MG tablet, Take 1 tablet (325 mg total) by mouth daily with breakfast., Disp: 30 tablet, Rfl: 3 .  furosemide (LASIX) 40 MG tablet, Take 2 tablets (80 mg total) by mouth 2 (two) times daily., Disp: 360 tablet, Rfl: 3 .  gabapentin (NEURONTIN) 300 MG capsule, TAKE 2 CAPSULES BY MOUTH 2 TIMES DAILY., Disp: 120 capsule, Rfl: 3 .  ibuprofen (ADVIL) 800 MG tablet, Take 800 mg by mouth every 8 (eight) hours as needed. (Patient not taking: Reported on 08/03/2020), Disp: , Rfl:  .  Insulin Lispro Prot & Lispro (HUMALOG MIX 75/25 KWIKPEN) (75-25) 100 UNIT/ML Kwikpen, Inject 30  Units into the skin 2 (two) times daily., Disp: 15 mL, Rfl: 11 .  Multiple Vitamins-Minerals (CENTRUM SILVER 50+MEN) TABS, Take 1 tablet by mouth daily., Disp: , Rfl:  .  pantoprazole (PROTONIX) 40 MG tablet, TAKE 1 TABLET (40 MG TOTAL) BY MOUTH 2 (TWO) TIMES DAILY., Disp: 60 tablet, Rfl: 1 .  potassium chloride SA (KLOR-CON M20) 20 MEQ tablet, Take 1 tablet (20 mEq total) by mouth daily., Disp: 30 tablet, Rfl: 0 .  QUEtiapine (SEROQUEL) 200 MG tablet, Take 1 tablet (200 mg total) by  mouth at bedtime., Disp: 30 tablet, Rfl: 6 .  sacubitril-valsartan (ENTRESTO) 97-103 MG, Take 1 tablet by mouth 2 (two) times daily., Disp: 60 tablet, Rfl: 5 .  sildenafil (VIAGRA) 100 MG tablet, Take 1 tablet (100 mg total) by mouth daily as needed for erectile dysfunction., Disp: 30 tablet, Rfl: 2 .  spironolactone (ALDACTONE) 25 MG tablet, Take 1 tablet (25 mg total) by mouth daily., Disp: 30 tablet, Rfl: 11 .  tetrahydrozoline 0.05 % ophthalmic solution, Place 1 drop into both eyes daily., Disp: , Rfl:  .  TRUEPLUS PEN NEEDLES 31G X 6 MM MISC, USE AS DIRECTED, Disp: 100 each, Rfl: 0 No Known Allergies   Social History   Socioeconomic History  . Marital status: Legally Separated    Spouse name: Not on file  . Number of children: Not on file  . Years of education: Not on file  . Highest education level: Not on file  Occupational History  . Not on file  Tobacco Use  . 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 Smokeless tobacco: Never Used  Vaping Use  . Vaping Use: Never used  Substance and Sexual Activity  . Alcohol use: Yes    Alcohol/week: 2.0 standard drinks    Types: 2 Cans of beer per week  . Drug use: No  . Sexual activity: Yes  Other Topics Concern  . Not on file  Social History Narrative  . Not on file   Social Determinants of Health   Financial Resource Strain: Low Risk   . Difficulty of Paying Living Expenses: Not very hard  Food Insecurity: No Food Insecurity  . Worried About Charity fundraiser in the Last Year: Never true  . Ran Out of Food in the Last Year: Never true  Transportation Needs: Unmet Transportation Needs  . Lack of Transportation (Medical): Yes  . Lack of Transportation (Non-Medical): Yes  Physical Activity:   . Days of Exercise per Week: Not on file  . Minutes of Exercise per Session: Not on file  Stress:   . Feeling of Stress : Not on file  Social Connections:   . Frequency of Communication with Friends and Family: Not on file  . Frequency of  Social Gatherings with Friends and Family: Not on file  . Attends Religious Services: Not on file  . Active Member of Clubs or Organizations: Not on file  . Attends Archivist Meetings: Not on file  . Marital Status: Not on file  Intimate Partner Violence:   . Fear of Current or Ex-Partner: Not on file  . Emotionally Abused: Not on file  . Physically Abused: Not on file  . Sexually Abused: Not on file    Physical Exam Vitals reviewed.  Constitutional:      Appearance: He is normal weight.  HENT:     Head: Normocephalic.     Nose: Nose normal.     Mouth/Throat:     Mouth: Mucous  membranes are moist.  Eyes:     Pupils: Pupils are equal, round, and reactive to light.  Cardiovascular:     Rate and Rhythm: Normal rate and regular rhythm.     Pulses: Normal pulses.     Heart sounds: Normal heart sounds.  Pulmonary:     Effort: Pulmonary effort is normal.     Breath sounds: Normal breath sounds.  Abdominal:     General: Abdomen is flat.     Palpations: Abdomen is soft.  Musculoskeletal:        General: Normal range of motion.     Cervical back: Normal range of motion.  Skin:    General: Skin is warm and dry.     Capillary Refill: Capillary refill takes less than 2 seconds.  Neurological:     General: No focal deficit present.     Mental Status: He is alert.  Psychiatric:        Mood and Affect: Mood normal.    Arrived for home visit for [facility] who was alert and oriented reporting to be feeling fine. [name] was compliant with medications over the last week. Vitals were obtained. Meds were reviewed and confirmed. Pill box filled accordingly. [name] will need lasix SUN, MON, TUES AM. [name] understood same and note was left advising same. [name] agreed to visit in one week. [name] was reminded of upcoming appointments. Home visit complete.   Refills- Lasix Carvedilol Insulin       Future Appointments  Date Time Provider [place]  08/19/2020 10:30 AM Valente David, RN [facility] None  09/01/2020 10:00 AM [facility]  09/01/2020 11:00 AM [facility]CREEN[facility] None  09/14/2020  2:00 PM [facility] DEVICE 1 [facility]  11/02/2020  1:40 PM [name], MD [facility] None  12/08/2020  1:15 PM [name], MD [facility]  01/19/2021  9:00 AM Azzie Glatter, FNP [facility] None     ACTION: Home visit completed Next visit planned for one week

## 2020-08-18 MED FILL — CARVEDILOL 12.5 MG TABLET: 12.5 | 30 days supply | Qty: 90 | Fill #0

## 2020-08-18 MED FILL — HUMALOG MIX 75-25 KWIKPEN: (75-25) 100 | 25 days supply | Qty: 15 | Fill #7 | Status: TO

## 2020-08-18 MED FILL — FUROSEMIDE 40 MG TAB: 40 | 30 days supply | Qty: 120 | Fill #1

## 2020-08-19 ENCOUNTER — Other Ambulatory Visit: Payer: Self-pay | Admitting: *Deleted

## 2020-08-19 NOTE — Patient Outreach (Addendum)
[facility]) Care Management  08/19/2020  [name] 07-11-1961 [id number]   Call placed to member to follow up on HF management.  No answer, HIPAA compliant voice message left.  Will follow up within the next 3-4 business days.    Update:  Incoming call received from member, state he has been managing condition well.  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 Report he was 314 pounds today, per chart and EMT, this has been stable over the last 2 weeks.  He denies any shortness of breath or chest discomfort, has appointment for ICD placement on 10/8.  Explained purpose and expectations of procedure, verbalizes understanding.    Report his blood sugars has been "up and down" lately.  Report today was 220, but has been as low as the 120's.  Denies hypoglycemic/hyperglycemic events.  He is still working on getting dental surgery, has appointments 10/4 and 10/5.  State he is trying to eat more salads and fruits.    Denies any urgent concerns at this time, will follow up within the next month.  Will send EMMI education regarding ICD placement.  Encouraged to contact this care manager with questions.  Goals Addressed              This Visit's Progress   .  COMPLETED: Manage condition and not be readmitted.  Increase activity. (pt-stated)        CARE PLAN ENTRY (see longtitudinal plan of care for additional care plan information)   Current Barriers:  [name] Kitchen Knowledge deficit related to basic heart failure pathophysiology and self care management  Case Manager Clinical Goal(s):  [name] Kitchen Over the next 45 days, patient will verbalize understanding of Heart Failure Action Plan and when to call doctor . Over the next 28 days, patient will take all Heart Failure mediations as prescribed . Over the next 28 days, patient will weigh daily and record (notifying MD of 3 lb weight gain over night or 5 lb in  a week)  Interventions:  . Provided written education on low sodium diet . Reviewed Heart Failure Action Plan in depth and provided written copy . Discussed importance of daily weight and advised patient to weigh and record daily  Patient Self Care Activities:  . Weighs daily and record (notifying MD of 3 lb weight gain over night or 5 lb in a week) . Verbalizes understanding of and follows CHF Action Plan . Adheres to low sodium diet  Initial goal documentation         Joel David, RN, MSN [place] [phone]

## 2020-08-24 ENCOUNTER — Other Ambulatory Visit (HOSPITAL_COMMUNITY): Payer: Self-pay

## 2020-08-24 NOTE — Progress Notes (Signed)
Paramedicine Encounter    Patient ID: [name], male    DOB: 1960/12/23, 59 y.o.   MRN: [id number]   Patient Care Team: Azzie Glatter, FNP as PCP - General (Family Medicine) [name], MD as PCP - Cardiology (Cardiology) [name], MD as PCP - Advanced Heart Failure (Cardiology) [name], MD as PCP - Electrophysiology (Cardiology) Valente David, RN as [place] [employer] [facility]  Patient Active Problem List   Diagnosis Date Noted  . Acute blood loss anemia   . Angiodysplasia of stomach   . Chronic anticoagulation   . CHF exacerbation ([name]) 06/15/2020  . AKI (acute kidney injury) ([place]) 06/15/2020  . CKD (chronic kidney disease), stage III 06/15/2020  . Anemia due to chronic blood loss   . Gastric AVM   . Angiodysplasia of small intestine   . Acute on chronic systolic (congestive) heart failure ([place]) 04/05/2020  . Anemia 04/05/2020  . PAF (paroxysmal atrial fibrillation) ([place]) 04/05/2020  . OSA on CPAP 04/05/2020  . Syncope and collapse 04/05/2020  . Type 2 diabetes mellitus with hyperglycemia, with long-term current use of insulin ([place]) 12/03/2019  . Diabetic polyneuropathy associated with type 2 diabetes mellitus ([place]) 12/03/2019  . Hyperglycemia 12/03/2019  . History of hyperglycemia 12/03/2019  . Erectile dysfunction 12/03/2019  . CHF (congestive heart failure) ([place]) 10/11/2019  . Paranoid schizophrenia, chronic condition ([place]) 01/26/2015  . Severe recurrent major depressive disorder with psychotic features ([place]) 01/26/2015  . GAD (generalized anxiety disorder) 01/26/2015  . OCD (obsessive compulsive disorder) 01/26/2015  . Panic disorder without agoraphobia 01/26/2015  . Insomnia 01/26/2015    Current Outpatient Medications:  .  acetaminophen (TYLENOL) 500 MG tablet, Take 1 tablet (500 mg total) by mouth every 6 (six) hours as needed for mild pain or moderate pain., Disp: 60 tablet, Rfl: 3 .  apixaban (ELIQUIS) 5 MG TABS  tablet, Take 1 tablet (5 mg total) by mouth 2 (two) times daily., Disp: 180 tablet, Rfl: 3 .  atorvastatin (LIPITOR) 40 MG tablet, Take 1 tablet (40 mg total) by mouth daily at 6 PM., Disp: 90 tablet, Rfl: 3 .  blood glucose meter kit and supplies KIT, Dispense based on patient and insurance preference. Use up to four times daily as directed. (FOR ICD-9 250.00, 250.01)., Disp: 1 each, Rfl: 0 .  buPROPion (WELLBUTRIN SR) 100 MG 12 hr tablet, Take 1 tablet (100 mg total) by mouth 2 (two) times daily., Disp: 60 tablet, Rfl: 6 .  carvedilol (COREG) 12.5 MG tablet, Take 1.5 tablets (18.75 mg total) by mouth 2 (two) times daily with a meal., Disp: 90 tablet, Rfl: 6 .  cyclobenzaprine (FLEXERIL) 10 MG tablet, Take 10 mg by mouth 3 (three) times daily as needed for muscle spasms.  (Patient not taking: Reported on 08/03/2020), Disp: , Rfl:  .  ferrous sulfate 325 (65 FE) MG tablet, Take 1 tablet (325 mg total) by mouth daily with breakfast., Disp: 30 tablet, Rfl: 3 .  furosemide (LASIX) 40 MG tablet, Take 2 tablets (80 mg total) by mouth 2 (two) times daily., Disp: 360 tablet, Rfl: 3 .  gabapentin (NEURONTIN) 300 MG capsule, TAKE 2 CAPSULES BY MOUTH 2 TIMES DAILY., Disp: 120 capsule, Rfl: 3 .  ibuprofen (ADVIL) 800 MG tablet, Take 800 mg by mouth every 8 (eight) hours as needed. (Patient not taking: Reported on 08/03/2020), Disp: , Rfl:  .  Insulin Lispro Prot & Lispro (HUMALOG MIX 75/25 KWIKPEN) (75-25) 100 UNIT/ML Kwikpen, Inject 30  Units into the skin 2 (two) times daily., Disp: 15 mL, Rfl: 11 .  Multiple Vitamins-Minerals (CENTRUM SILVER 50+MEN) TABS, Take 1 tablet by mouth daily., Disp: , Rfl:  .  pantoprazole (PROTONIX) 40 MG tablet, TAKE 1 TABLET (40 MG TOTAL) BY MOUTH 2 (TWO) TIMES DAILY., Disp: 60 tablet, Rfl: 1 .  potassium chloride SA (KLOR-CON M20) 20 MEQ tablet, Take 1 tablet (20 mEq total) by mouth daily., Disp: 30 tablet, Rfl: 0 .  QUEtiapine (SEROQUEL) 200 MG tablet, Take 1 tablet (200 mg total) by  mouth at bedtime., Disp: 30 tablet, Rfl: 6 .  sacubitril-valsartan (ENTRESTO) 97-103 MG, Take 1 tablet by mouth 2 (two) times daily., Disp: 60 tablet, Rfl: 5 .  sildenafil (VIAGRA) 100 MG tablet, Take 1 tablet (100 mg total) by mouth daily as needed for erectile dysfunction., Disp: 30 tablet, Rfl: 2 .  spironolactone (ALDACTONE) 25 MG tablet, Take 1 tablet (25 mg total) by mouth daily., Disp: 30 tablet, Rfl: 11 .  tetrahydrozoline 0.05 % ophthalmic solution, Place 1 drop into both eyes daily., Disp: , Rfl:  .  TRUEPLUS PEN NEEDLES 31G X 6 MM MISC, USE AS DIRECTED, Disp: 100 each, Rfl: 0 No Known Allergies   Social History   Socioeconomic History  . Marital status: Legally Separated    Spouse name: Not on file  . Number of children: Not on file  . Years of education: Not on file  . Highest education level: Not on file  Occupational History  . Not on file  Tobacco Use  . 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 Smokeless tobacco: Never Used  Vaping Use  . Vaping Use: Never used  Substance and Sexual Activity  . Alcohol use: Yes    Alcohol/week: 2.0 standard drinks    Types: 2 Cans of beer per week  . Drug use: No  . Sexual activity: Yes  Other Topics Concern  . Not on file  Social History Narrative  . Not on file   Social Determinants of Health   Financial Resource Strain: Low Risk   . Difficulty of Paying Living Expenses: Not very hard  Food Insecurity: No Food Insecurity  . Worried About Charity fundraiser in the Last Year: Never true  . Ran Out of Food in the Last Year: Never true  Transportation Needs: Unmet Transportation Needs  . Lack of Transportation (Medical): Yes  . Lack of Transportation (Non-Medical): Yes  Physical Activity:   . Days of Exercise per Week: Not on file  . Minutes of Exercise per Session: Not on file  Stress:   . Feeling of Stress : Not on file  Social Connections:   . Frequency of Communication with Friends and Family: Not on file  . Frequency of  Social Gatherings with Friends and Family: Not on file  . Attends Religious Services: Not on file  . Active Member of Clubs or Organizations: Not on file  . Attends Archivist Meetings: Not on file  . Marital Status: Not on file  Intimate Partner Violence:   . Fear of Current or Ex-Partner: Not on file  . Emotionally Abused: Not on file  . Physically Abused: Not on file  . Sexually Abused: Not on file    Physical Exam Vitals reviewed.  Constitutional:      Appearance: He is normal weight.  HENT:     Head: Normocephalic.     Nose: Nose normal.     Mouth/Throat:     Mouth: Mucous  membranes are moist.  Eyes:     Pupils: Pupils are equal, round, and reactive to light.  Cardiovascular:     Rate and Rhythm: Normal rate and regular rhythm.     Pulses: Normal pulses.     Heart sounds: Normal heart sounds.  Pulmonary:     Effort: Pulmonary effort is normal.     Breath sounds: Normal breath sounds.  Abdominal:     Palpations: Abdomen is soft.  Musculoskeletal:        General: Normal range of motion.     Cervical back: Normal range of motion.     Right lower leg: No edema.     Left lower leg: No edema.  Skin:    General: Skin is warm and dry.     Capillary Refill: Capillary refill takes less than 2 seconds.  Neurological:     Mental Status: He is alert. Mental status is at baseline.  Psychiatric:        Mood and Affect: Mood normal.     Arrived for home visit for [facility] who was alert and oriented with no reports of any complaints. [name] was compliant with his medications over the last week. Medications were reviewed and confirmed. Pill box filled. Vitals obtained and as noted. Chris's weight up 6lbs, he reports eating heavily over the weekend including bacon and sausage. I advised [name] to decrease his salt intake and his fluid intake. [name] verbalized understanding. [name] will have upcoming procedure, I reviewed appointment times with him. [name] agreed. Home visit  complete. I will see in one week.   Refills- NONE   CBG- 205     Future Appointments  Date Time Provider [place]  09/01/2020 10:00 AM [facility]  09/01/2020 11:00 AM [facility]-SCREENING [facility] None  09/14/2020  2:00 PM [facility] DEVICE 1 [facility]  11/02/2020  1:40 PM [name], MD [facility] None  12/08/2020  1:15 PM [name], MD [facility]  01/19/2021  9:00 AM Azzie Glatter, FNP [facility] None     ACTION: Home visit completed Next visit planned for one week

## 2020-08-31 ENCOUNTER — Other Ambulatory Visit (HOSPITAL_COMMUNITY): Payer: Self-pay

## 2020-08-31 NOTE — Progress Notes (Signed)
Paramedicine Encounter    Patient ID: [name], male    DOB: 1961-04-15, 59 y.o.   MRN: [id number]   Patient Care Team: Azzie Glatter, FNP as PCP - General (Family Medicine) [name], MD as PCP - Cardiology (Cardiology) [name], MD as PCP - Advanced Heart Failure (Cardiology) [name], MD as PCP - Electrophysiology (Cardiology) Valente David, RN as [place] Management  Patient Active Problem List   Diagnosis Date Noted  . Acute blood loss anemia   . Angiodysplasia of stomach   . Chronic anticoagulation   . CHF exacerbation ([place]) 06/15/2020  . AKI (acute kidney injury) ([place]) 06/15/2020  . CKD (chronic kidney disease), stage III ([place]) 06/15/2020  . Anemia due to chronic blood loss   . Gastric AVM   . Angiodysplasia of small intestine ([place])   . Acute on chronic systolic (congestive) heart failure ([place]) 04/05/2020  . Anemia 04/05/2020  . PAF (paroxysmal atrial fibrillation) ([place]) 04/05/2020  . OSA on CPAP 04/05/2020  . Syncope and collapse 04/05/2020  . Type 2 diabetes mellitus with hyperglycemia, with long-term current use of insulin ([place]) 12/03/2019  . Diabetic polyneuropathy associated with type 2 diabetes mellitus ([place]) 12/03/2019  . Hyperglycemia 12/03/2019  . History of hyperglycemia 12/03/2019  . Erectile dysfunction 12/03/2019  . CHF (congestive heart failure) ([place]) 10/11/2019  . Paranoid schizophrenia, chronic condition ([place]) 01/26/2015  . Severe recurrent major depressive disorder with psychotic features ([place]) 01/26/2015  . GAD (generalized anxiety disorder) 01/26/2015  . OCD (obsessive compulsive disorder) 01/26/2015  . Panic disorder without agoraphobia 01/26/2015  . Insomnia 01/26/2015    Current Outpatient Medications:  .  acetaminophen (TYLENOL) 500 MG tablet, Take 1 tablet (500 mg total) by mouth every 6 (six) hours as needed for mild pain or moderate pain. (Patient taking differently: Take 1,000 mg by  mouth every 6 (six) hours as needed for mild pain or moderate pain. ), Disp: 60 tablet, Rfl: 3 .  apixaban (ELIQUIS) 5 MG TABS tablet, Take 1 tablet (5 mg total) by mouth 2 (two) times daily., Disp: 180 tablet, Rfl: 3 .  atorvastatin (LIPITOR) 40 MG tablet, Take 1 tablet (40 mg total) by mouth daily at 6 PM., Disp: 90 tablet, Rfl: 3 .  blood glucose meter kit and supplies KIT, Dispense based on patient and insurance preference. Use up to four times daily as directed. (FOR ICD-9 250.00, 250.01)., Disp: 1 each, Rfl: 0 .  buPROPion (WELLBUTRIN SR) 100 MG 12 hr tablet, Take 1 tablet (100 mg total) by mouth 2 (two) times daily., Disp: 60 tablet, Rfl: 6 .  carvedilol (COREG) 12.5 MG tablet, Take 1.5 tablets (18.75 mg total) by mouth 2 (two) times daily with a meal., Disp: 90 tablet, Rfl: 6 .  cyclobenzaprine (FLEXERIL) 10 MG tablet, Take 10 mg by mouth daily as needed for muscle spasms. , Disp: , Rfl:  .  ferrous sulfate 325 (65 FE) MG tablet, Take 1 tablet (325 mg total) by mouth daily with breakfast. (Patient taking differently: Take 325 mg by mouth 2 (two) times daily with a meal. ), Disp: 30 tablet, Rfl: 3 .  furosemide (LASIX) 40 MG tablet, Take 2 tablets (80 mg total) by mouth 2 (two) times daily., Disp: 360 tablet, Rfl: 3 .  gabapentin (NEURONTIN) 300 MG capsule, TAKE 2 CAPSULES BY MOUTH 2 TIMES DAILY. (Patient taking differently: Take 600 mg by mouth 2 (two) times daily. ), Disp: 120 capsule, Rfl: 3 .  Insulin Lispro Prot & Lispro (HUMALOG MIX 75/25 KWIKPEN) (75-25) 100 UNIT/ML Kwikpen, Inject 30 Units into the skin 2 (two) times daily., Disp: 15 mL, Rfl: 11 .  Multiple Vitamins-Minerals (CENTRUM SILVER 50+MEN) TABS, Take 1 tablet by mouth daily., Disp: , Rfl:  .  pantoprazole (PROTONIX) 40 MG tablet, TAKE 1 TABLET (40 MG TOTAL) BY MOUTH 2 (TWO) TIMES DAILY., Disp: 60 tablet, Rfl: 1 .  potassium chloride SA (KLOR-CON M20) 20 MEQ tablet, Take 1 tablet (20 mEq total) by mouth daily., Disp: 30 tablet,  Rfl: 0 .  QUEtiapine (SEROQUEL) 200 MG tablet, Take 1 tablet (200 mg total) by mouth at bedtime., Disp: 30 tablet, Rfl: 6 .  sacubitril-valsartan (ENTRESTO) 97-103 MG, Take 1 tablet by mouth 2 (two) times daily., Disp: 60 tablet, Rfl: 5 .  sildenafil (VIAGRA) 100 MG tablet, Take 1 tablet (100 mg total) by mouth daily as needed for erectile dysfunction., Disp: 30 tablet, Rfl: 2 .  spironolactone (ALDACTONE) 25 MG tablet, Take 1 tablet (25 mg total) by mouth daily., Disp: 30 tablet, Rfl: 11 .  tetrahydrozoline 0.05 % ophthalmic solution, Place 1 drop into both eyes daily., Disp: , Rfl:  .  TRUEPLUS PEN NEEDLES 31G X 6 MM MISC, USE AS DIRECTED, Disp: 100 each, Rfl: 0 No Known Allergies   Social History   Socioeconomic History  . Marital status: Legally Separated    Spouse name: Not on file  . Number of children: Not on file  . Years of education: Not on file  . Highest education level: Not on file  Occupational History  . Not on file  Tobacco Use  . 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 Smokeless tobacco: Never Used  Vaping Use  . Vaping Use: Never used  Substance and Sexual Activity  . Alcohol use: Yes    Alcohol/week: 2.0 standard drinks    Types: 2 Cans of beer per week  . Drug use: No  . Sexual activity: Yes  Other Topics Concern  . Not on file  Social History Narrative  . Not on file   Social Determinants of Health   Financial Resource Strain: Low Risk   . Difficulty of Paying Living Expenses: Not very hard  Food Insecurity: No Food Insecurity  . Worried About Charity fundraiser in the Last Year: Never true  . Ran Out of Food in the Last Year: Never true  Transportation Needs: Unmet Transportation Needs  . Lack of Transportation (Medical): Yes  . Lack of Transportation (Non-Medical): Yes  Physical Activity:   . Days of Exercise per Week: Not on file  . Minutes of Exercise per Session: Not on file  Stress:   . Feeling of Stress : Not on file  Social Connections:   .  Frequency of Communication with Friends and Family: Not on file  . Frequency of Social Gatherings with Friends and Family: Not on file  . Attends Religious Services: Not on file  . Active Member of Clubs or Organizations: Not on file  . Attends Archivist Meetings: Not on file  . Marital Status: Not on file  Intimate Partner Violence:   . Fear of Current or Ex-Partner: Not on file  . Emotionally Abused: Not on file  . Physically Abused: Not on file  . Sexually Abused: Not on file    Physical Exam Vitals reviewed.  Constitutional:      Appearance: He is normal weight.  HENT:     Head: Normocephalic.  Nose: Nose normal.     Mouth/Throat:     Mouth: Mucous membranes are moist.     Pharynx: Oropharynx is clear.  Eyes:     Pupils: Pupils are equal, round, and reactive to light.  Cardiovascular:     Rate and Rhythm: Normal rate and regular rhythm.     Pulses: Normal pulses.     Heart sounds: Normal heart sounds.  Pulmonary:     Effort: Pulmonary effort is normal.     Breath sounds: Normal breath sounds.  Abdominal:     General: Abdomen is flat.     Palpations: Abdomen is soft.  Musculoskeletal:        General: Normal range of motion.     Cervical back: Normal range of motion.  Skin:    General: Skin is warm and dry.     Capillary Refill: Capillary refill takes less than 2 seconds.  Neurological:     Mental Status: He is alert. Mental status is at baseline.  Psychiatric:        Mood and Affect: Mood normal.    Arrived for home visit for [name] who was alert and oriented reporting he just arrived home from his dental appointment where he was scheduled to see an oral surgeon on October 14th. [name]'s medications were reviewed and confirmed and pill box filled accordingly. Vitals were obtained. No edema noted, lung sounds clear. EKO reading obtained. [name] reported that he has had a lot of back, shoulder and knee pain lately in which he thinks is related to his  arthritis. [name] requested appointment with his PCP to address same. I scheduled same for [facility]. Upcoming appointments reviewed with [name]. Home visit complete.   Refills:  Entresto Gabapentin Spironolactone Pantoprazole  Iron Supplement      Future Appointments  Date Time Provider [place]  09/01/2020 10:00 AM [facility] LAB [facility]  09/01/2020 11:00 AM [facility]ENING [facility] None  09/14/2020  2:00 PM [facility] DEVICE 1 [facility]  11/02/2020  1:40 PM [name], MD [facility] None  12/08/2020  1:15 PM [name], MD [facility]  01/19/2021  9:00 AM Azzie Glatter, FNP [facility] None     ACTION: Home visit completed Next visit planned for one week

## 2020-09-01 ENCOUNTER — Other Ambulatory Visit: Payer: Medicare Other | Admitting: *Deleted

## 2020-09-01 ENCOUNTER — Other Ambulatory Visit: Payer: Self-pay

## 2020-09-01 ENCOUNTER — Other Ambulatory Visit (HOSPITAL_COMMUNITY)
Admission: RE | Admit: 2020-09-01 | Discharge: 2020-09-01 | Disposition: A | Discharge status: Home / Self Care | Payer: Medicare Other | Source: Ambulatory Visit | Attending: Cardiology | Admitting: Cardiology

## 2020-09-01 ENCOUNTER — Other Ambulatory Visit: Payer: Self-pay | Admitting: Cardiology

## 2020-09-01 DIAGNOSIS — Z01812 Encounter for preprocedural laboratory examination: Secondary | ICD-10-CM | POA: Diagnosis present

## 2020-09-01 DIAGNOSIS — Z20822 Contact with and (suspected) exposure to covid-19: Secondary | ICD-10-CM | POA: Insufficient documentation

## 2020-09-01 DIAGNOSIS — I5022 Chronic systolic (congestive) heart failure: Secondary | ICD-10-CM | POA: Diagnosis not present

## 2020-09-01 DIAGNOSIS — I480 Paroxysmal atrial fibrillation: Secondary | ICD-10-CM | POA: Diagnosis not present

## 2020-09-01 LAB — CBC WITH DIFFERENTIAL/PLATELET
Basophils Absolute: 0 10*3/uL (ref 0.0–0.2)
Basos: 0 %
EOS (ABSOLUTE): 0.2 10*3/uL (ref 0.0–0.4)
Eos: 3 %
Hematocrit: 38.1 % (ref 37.5–51.0)
Hemoglobin: 11.9 g/dL — ABNORMAL LOW (ref 13.0–17.7)
Lymphocytes Absolute: 1.2 10*3/uL (ref 0.7–3.1)
Lymphs: 21 %
MCH: 25.6 pg — ABNORMAL LOW (ref 26.6–33.0)
MCHC: 31.2 g/dL — ABNORMAL LOW (ref 31.5–35.7)
MCV: 82 fL (ref 79–97)
Monocytes Absolute: 0.5 10*3/uL (ref 0.1–0.9)
Monocytes: 9 %
Neutrophils Absolute: 3.8 10*3/uL (ref 1.4–7.0)
Neutrophils: 67 %
Platelets: 250 10*3/uL (ref 150–450)
RBC: 4.65 x10E6/uL (ref 4.14–5.80)
RDW: 16.3 % — ABNORMAL HIGH (ref 11.6–15.4)
WBC: 5.7 10*3/uL (ref 3.4–10.8)

## 2020-09-01 LAB — BASIC METABOLIC PANEL
BUN/Creatinine Ratio: 27 — ABNORMAL HIGH (ref 10–24)
BUN: 26 mg/dL (ref 8–27)
CO2: 28 mmol/L (ref 20–29)
Calcium: 9 mg/dL (ref 8.6–10.2)
Chloride: 101 mmol/L (ref 96–106)
Creatinine, Ser: 0.98 mg/dL (ref 0.76–1.27)
GFR calc Af Amer: 96 mL/min/{1.73_m2} (ref >59)
GFR calc non Af Amer: 83 mL/min/{1.73_m2} (ref >59)
Glucose: 344 mg/dL — ABNORMAL HIGH (ref 65–99)
Potassium: 4.1 mmol/L (ref 3.5–5.2)
Sodium: 138 mmol/L (ref 134–144)

## 2020-09-01 LAB — SARS CORONAVIRUS 2 (TAT 6-24 HRS): SARS Coronavirus 2: NEGATIVE

## 2020-09-01 MED FILL — SPIRONOLACTONE 25 MG TABLET: 25 | 30 days supply | Qty: 30 | Fill #5

## 2020-09-01 MED FILL — GABAPENTIN 300 MG CAPSULE: 300 | 30 days supply | Qty: 120 | Fill #2

## 2020-09-02 MED FILL — PANTOPRAZOLE SOD DR 40 MG T: 40 | 30 days supply | Qty: 60 | Fill #0

## 2020-09-02 NOTE — Progress Notes (Signed)
Instructed patient on the following items: Arrival time 0930 Nothing to eat or drink after midnight No meds AM of procedure Responsible person to drive you home and stay with you for 24 hrs Wash with special soap night before and morning of procedure If on anti-coagulant drug instructions Last Eliquis was Tuesday 10/5

## 2020-09-03 ENCOUNTER — Ambulatory Visit (HOSPITAL_COMMUNITY)
Admission: RE | Disposition: A | Discharge status: Home / Self Care | Payer: Self-pay | Source: Home / Self Care | Attending: Cardiology

## 2020-09-03 ENCOUNTER — Ambulatory Visit (HOSPITAL_COMMUNITY): Payer: Medicare Other

## 2020-09-03 ENCOUNTER — Other Ambulatory Visit: Payer: Self-pay

## 2020-09-03 ENCOUNTER — Ambulatory Visit (HOSPITAL_COMMUNITY)
Admission: RE | Admit: 2020-09-03 | Discharge: 2020-09-03 | Disposition: A | Discharge status: Home / Self Care | Payer: Medicare Other | Attending: Cardiology | Admitting: Cardiology

## 2020-09-03 DIAGNOSIS — F329 Major depressive disorder, single episode, unspecified: Secondary | ICD-10-CM | POA: Diagnosis not present

## 2020-09-03 DIAGNOSIS — Z7901 Long term (current) use of anticoagulants: Secondary | ICD-10-CM | POA: Insufficient documentation

## 2020-09-03 DIAGNOSIS — I480 Paroxysmal atrial fibrillation: Secondary | ICD-10-CM | POA: Insufficient documentation

## 2020-09-03 DIAGNOSIS — Z006 Encounter for examination for normal comparison and control in clinical research program: Secondary | ICD-10-CM | POA: Insufficient documentation

## 2020-09-03 DIAGNOSIS — G4730 Sleep apnea, unspecified: Secondary | ICD-10-CM | POA: Diagnosis not present

## 2020-09-03 DIAGNOSIS — Z95810 Presence of automatic (implantable) cardiac defibrillator: Secondary | ICD-10-CM

## 2020-09-03 DIAGNOSIS — I5022 Chronic systolic (congestive) heart failure: Secondary | ICD-10-CM | POA: Insufficient documentation

## 2020-09-03 DIAGNOSIS — I255 Ischemic cardiomyopathy: Secondary | ICD-10-CM | POA: Diagnosis not present

## 2020-09-03 DIAGNOSIS — F209 Schizophrenia, unspecified: Secondary | ICD-10-CM | POA: Insufficient documentation

## 2020-09-03 DIAGNOSIS — E119 Type 2 diabetes mellitus without complications: Secondary | ICD-10-CM | POA: Diagnosis not present

## 2020-09-03 DIAGNOSIS — Z87891 Personal history of nicotine dependence: Secondary | ICD-10-CM | POA: Insufficient documentation

## 2020-09-03 DIAGNOSIS — I2510 Atherosclerotic heart disease of native coronary artery without angina pectoris: Secondary | ICD-10-CM | POA: Diagnosis not present

## 2020-09-03 DIAGNOSIS — I110 Hypertensive heart disease with heart failure: Secondary | ICD-10-CM | POA: Diagnosis not present

## 2020-09-03 DIAGNOSIS — I517 Cardiomegaly: Secondary | ICD-10-CM | POA: Diagnosis not present

## 2020-09-03 DIAGNOSIS — R21 Rash and other nonspecific skin eruption: Secondary | ICD-10-CM | POA: Diagnosis not present

## 2020-09-03 HISTORY — PX: ICD IMPLANT: EP1208

## 2020-09-03 LAB — GLUCOSE, CAPILLARY: Glucose-Capillary: 311 mg/dL — ABNORMAL HIGH (ref 70–99)

## 2020-09-03 SURGERY — ICD IMPLANT

## 2020-09-03 MED ORDER — INSULIN ASPART 100 UNIT/ML ~~LOC~~ SOLN
5.0000 [IU] | Freq: Once | SUBCUTANEOUS | Status: AC
  Administered 2020-09-03: 5 [IU] via SUBCUTANEOUS
  Filled 2020-09-03: qty 0.05

## 2020-09-03 MED ORDER — ACETAMINOPHEN 325 MG PO TABS
325.0000 mg | ORAL_TABLET | ORAL | Status: DC | PRN
  Filled 2020-09-03: qty 2

## 2020-09-03 MED ORDER — MIDAZOLAM HCL 5 MG/5ML IJ SOLN
INTRAMUSCULAR | Status: DC | PRN
  Administered 2020-09-03 (×3): 1 mg via INTRAVENOUS

## 2020-09-03 MED ORDER — LIDOCAINE HCL 1 % IJ SOLN
INTRAMUSCULAR | Status: AC
  Filled 2020-09-03: qty 60

## 2020-09-03 MED ORDER — HEPARIN (PORCINE) IN NACL 1000-0.9 UT/500ML-% IV SOLN
INTRAVENOUS | Status: DC | PRN
  Administered 2020-09-03: 500 mL

## 2020-09-03 MED ORDER — FENTANYL CITRATE (PF) 100 MCG/2ML IJ SOLN
INTRAMUSCULAR | Status: DC | PRN
Start: 2020-09-03 — End: 2020-09-03
  Administered 2020-09-03 (×3): 25 ug via INTRAVENOUS

## 2020-09-03 MED ORDER — CHLORHEXIDINE GLUCONATE 4 % EX LIQD
4.0000 "application " | Freq: Once | CUTANEOUS | Status: DC
  Filled 2020-09-03: qty 60

## 2020-09-03 MED ORDER — MIDAZOLAM HCL 5 MG/5ML IJ SOLN
INTRAMUSCULAR | Status: AC
  Filled 2020-09-03: qty 5

## 2020-09-03 MED ORDER — INSULIN ASPART 100 UNIT/ML ~~LOC~~ SOLN
SUBCUTANEOUS | Status: AC
  Filled 2020-09-03: qty 1

## 2020-09-03 MED ORDER — ONDANSETRON HCL 4 MG/2ML IJ SOLN: 4.0000 mg | Freq: Four times a day (QID) | INTRAMUSCULAR | Status: DC | PRN

## 2020-09-03 MED ORDER — SODIUM CHLORIDE 0.9 % IV SOLN
INTRAVENOUS | Status: AC
  Filled 2020-09-03: qty 2

## 2020-09-03 MED ORDER — FENTANYL CITRATE (PF) 100 MCG/2ML IJ SOLN
INTRAMUSCULAR | Status: AC
  Filled 2020-09-03: qty 2

## 2020-09-03 MED ORDER — DEXTROSE 5 % IV SOLN
3.0000 g | INTRAVENOUS | Status: AC
  Administered 2020-09-03: 3 g via INTRAVENOUS
  Filled 2020-09-03 (×2): qty 3000

## 2020-09-03 MED ORDER — HEPARIN (PORCINE) IN NACL 1000-0.9 UT/500ML-% IV SOLN
INTRAVENOUS | Status: AC
  Filled 2020-09-03: qty 500

## 2020-09-03 MED ORDER — LIDOCAINE HCL 1 % IJ SOLN
INTRAMUSCULAR | Status: AC
  Filled 2020-09-03: qty 20

## 2020-09-03 MED ORDER — LIDOCAINE HCL (PF) 1 % IJ SOLN
INTRAMUSCULAR | Status: DC | PRN
  Administered 2020-09-03: 70 mL

## 2020-09-03 MED ORDER — SODIUM CHLORIDE 0.9 % IV SOLN: INTRAVENOUS | Status: DC

## 2020-09-03 MED ORDER — SODIUM CHLORIDE 0.9 % IV SOLN: 80.0000 mg | INTRAVENOUS | Status: DC

## 2020-09-03 SURGICAL SUPPLY — 7 items
CABLE SURGICAL S-101-97-12 (CABLE) ×3 IMPLANT
ICD ACTICOR DX (ICD Generator) ×3 IMPLANT
LEAD PLEXA 65/17 (Lead) ×3 IMPLANT
PAD PRO RADIOLUCENT 2001M-C (PAD) ×3 IMPLANT
SHEATH 8FR PRELUDE SNAP 13 (SHEATH) ×3 IMPLANT
SHEATH PROBE COVER 6X72 (BAG) ×3 IMPLANT
TRAY PACEMAKER INSERTION (PACKS) ×3 IMPLANT

## 2020-09-03 NOTE — H&P (Signed)
Electrophysiology Office Note:    Date:  09/03/2020   ID:  [name], DOB January 17, 1961, MRN [id number]  PCP:  Azzie Glatter, [facility] Cardiologist:  [name], MD  [facility] Electrophysiologist:  [name], MD   Referring MD: Joel Dresser, MD   Chief Complaint: Ischemic cardiomyopathy, ICD evaluation  History of Present Illness:    [name] is a 59 y.o. male with a hx of ischemic cardiomyopathy and paroxysmal atrial fibrillation presents to clinic for an evaluation regarding ICD implant at the request of Dr. Aundra Dubin. His medical history is significant for chronic systolic heart failure secondary to coronary artery disease, diabetes, paroxysmal atrial fibrillation on Eliquis, schizophrenia. Recent echocardiogram shows an ejection fraction of 30% with diffuse hypokinesis.      Past Medical History:  Diagnosis Date  . Anxiety   . CAD (coronary artery disease)   . CHF (congestive heart failure) ([place]) 09/2019  . Depression   . Diabetes mellitus   . Erectile dysfunction 11/2019  . H/O right heart catheterization 09/2019  . Hypertension   . Schizophrenia ([place])   . Sleep apnea    uses cpap         Past Surgical History:  Procedure Laterality Date  . CORONARY STENT PLACEMENT    . ENTEROSCOPY N/A 04/08/2020   Procedure: ENTEROSCOPY;  Surgeon: Doran Stabler, MD;  Location: [facility] ENDOSCOPY;  Service: Gastroenterology;  Laterality: N/A;  . ENTEROSCOPY N/A 06/17/2020   Procedure: ENTEROSCOPY;  Surgeon: Irene Shipper, MD;  Location: [facility] ENDOSCOPY;  Service: Endoscopy;  Laterality: N/A;  . HEMOSTASIS CLIP PLACEMENT  04/08/2020   Procedure: HEMOSTASIS CLIP PLACEMENT;  Surgeon: Doran Stabler, MD;  Location: [facility];  Service: Gastroenterology;;  . HEMOSTASIS CONTROL  04/08/2020   Procedure: HEMOSTASIS CONTROL;  Surgeon: Doran Stabler, MD;  Location[facility] ENDOSCOPY;  Service: Gastroenterology;;    . Laretta Alstrom CONTROL  06/17/2020   Procedure: HEMOSTASIS CONTROL;  Surgeon: Irene Shipper, MD;  Location: [facility] ENDOSCOPY;  Service: Endoscopy;;  . HERNIA REPAIR    . RIGHT/LEFT HEART CATH AND CORONARY ANGIOGRAPHY N/A 10/14/2019   Procedure: RIGHT/LEFT HEART CATH AND CORONARY ANGIOGRAPHY;  Surgeon: Belva Crome, MD;  Location: [facility] CV LAB;  Service: Cardiovascular;  Laterality: N/A;    Current Medications: Active Medications  No outpatient medications have been marked as taking for the 08/03/20 encounter (Appointment) with [name], MD.       Allergies:   Patient has no known allergies.   Social History        Socioeconomic History  . Marital status: Legally Separated    Spouse name: Not on file  . Number of children: Not on file  . Years of education: Not on file  . Highest education level: Not on file  Occupational History  . Not on file  Tobacco Use  . 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 Smokeless tobacco: Never Used  Vaping Use  . Vaping Use: Never used  Substance and Sexual Activity  . Alcohol use: Yes    Alcohol/week: 2.0 standard drinks    Types: 2 Cans of beer per week  . Drug use: No  . Sexual activity: Yes  Other Topics Concern  . Not on file  Social History Narrative  . Not on file   Social Determinants of Health      Financial Resource Strain: Low Risk   .  Difficulty of Paying Living Expenses: Not very hard  Food Insecurity: No Food Insecurity  . Worried About Charity fundraiser in the Last Year: Never true  . Ran Out of Food in the Last Year: Never true  Transportation Needs: Unmet Transportation Needs  . Lack of Transportation (Medical): Yes  . Lack of Transportation (Non-Medical): Yes  Physical Activity:   . Days of Exercise per Week: Not on file  . Minutes of Exercise per Session: Not on file  Stress:   . Feeling of Stress : Not on file  Social Connections:   . Frequency of Communication with Friends and Family:  Not on file  . Frequency of Social Gatherings with Friends and Family: Not on file  . Attends Religious Services: Not on file  . Active Member of Clubs or Organizations: Not on file  . Attends Archivist Meetings: Not on file  . Marital Status: Not on file     Family History: The patient's family history includes Heart failure in his mother; Mental illness in his sister and sister.  ROS:   Please see the history of present illness.    All other systems reviewed and are negative.  EKGs/Labs/Other Studies Reviewed:    The following studies were reviewed today: Echo, ECG  04/05/2020 Echo 1. Left ventricular ejection fraction, by estimation, is 30%. The left  ventricle has moderate to severely decreased function. The left ventricle  demonstrates global hypokinesis. There is mild left ventricular  hypertrophy. Left ventricular diastolic  parameters are consistent with Grade II diastolic dysfunction  (pseudonormalization).  2. Right ventricular systolic function is mildly reduced. The right  ventricular size is mildly enlarged. There is mildly elevated pulmonary  artery systolic pressure. The estimated right ventricular systolic  pressure is 84.1 mmHg.  3. Left atrial size was mildly dilated.  4. Right atrial size was mildly dilated.  5. The mitral valve is normal in structure. Trivial mitral valve  regurgitation. No evidence of mitral stenosis.  6. The aortic valve is tricuspid. Aortic valve regurgitation is not  visualized. No aortic stenosis is present.  7. The inferior vena cava is dilated in size with <50% respiratory  variability, suggesting right atrial pressure of 15 mmHg.  EKG:  The ekg ordered today demonstrates sinus rhythm.  Recent Labs: 03/15/2020: NT-Pro BNP 231 04/05/2020: ALT 26 06/15/2020: B Natriuretic Peptide 1,620.5; Magnesium 2.1 06/29/2020: Hemoglobin 11.1; Platelets 437 07/19/2020: TSH 5.320 07/28/2020: BUN 21; Creatinine, Ser 1.32;  Potassium 4.2; Sodium 140  Recent Lipid Panel Labs (Brief)          Component Value Date/Time   CHOL 116 07/19/2020 0940   TRIG 128 07/19/2020 0940   HDL 42 07/19/2020 0940   CHOLHDL 2.8 07/19/2020 0940   CHOLHDL 3.6 03/29/2015 1402   VLDL 24 03/29/2015 1402   LDLCALC 51 07/19/2020 0940      Physical Exam:    VS:  There were no vitals taken for this visit.       Wt Readings from Last 3 Encounters:  07/28/20 (!) 308 lb (139.7 kg)  07/27/20 (!) 305 lb (138.3 kg)  07/20/20 (!) 310 lb (140.6 kg)     GEN:  Well nourished, well developed in no acute distress, obese HEENT: Normal NECK: No JVD; No carotid bruits LYMPHATICS: No lymphadenopathy CARDIAC: RRR, no murmurs, rubs, gallops RESPIRATORY:  Clear to auscultation without rales, wheezing or rhonchi  ABDOMEN: Soft, non-tender, non-distended MUSCULOSKELETAL:  No edema; No deformity  SKIN: Warm and dry  NEUROLOGIC:  Alert and oriented x 3 PSYCHIATRIC:  Normal affect   ASSESSMENT:    1. Chronic systolic congestive heart failure ([place])   2. PAF (paroxysmal atrial fibrillation) ([facility])    PLAN:    In order of problems listed above:  1. Chronic systolic heart failure, ejection fraction 30% NYHA class II-III symptoms on stable optimal medical therapy. Recommend primary prevention ICD. Patient has a narrow QRS. Given the history of atrial arrhythmias we will plan for a dual-chamber ICD. Risks, benefits, alternatives were discussed with the patient during today's visit and he wishes to proceed.  The patient has an  ischemic CM (EF 30%), NYHA Class III CHF, and CAD.  He is referred by Dr Aundra Dubin for risk stratification of sudden death and consideration of ICD implantation.  At this time, he meets criteria for ICD implantation for primary prevention of sudden death.  I have had a thorough discussion with the patient reviewing options.  The patient and their family (if available) have had opportunities to ask questions  and have them answered. The patient and I have decided together through a shared decision making process to implant ICD at this time.   Risks, benefits, alternatives to ICD implantation were discussed in detail with the patient today. The patient understands that the risks include but are not limited to bleeding, infection, pneumothorax, perforation, tamponade, vascular damage, renal failure, MI, stroke, death, inappropriate shocks, and lead dislodgement and wishes to proceed.  We will therefore schedule device implantation at the next available time.  2. Paroxysmal atrial fibrillation Continue apixaban. Will use a dual chamber PPM for atrial diagnostics.      I have seen, examined the patient. He presents for ICD today.   [name], MD 09/03/2020 12:33 PM

## 2020-09-03 NOTE — Progress Notes (Signed)
[facility] 311 Dr Quentin Ore informed new orders noted

## 2020-09-03 NOTE — Discharge Instructions (Signed)
After Your ICD (Implantable Cardiac Defibrillator)   . You have a [employer]ot[employer] ICD  ACTIVITY . Do not lift your arm above shoulder height for 1 week after your procedure. After 7 days, you may progress as below.     Friday September 10, 2020  Saturday September 11, 2020 Sunday September 12, 2020 Monday September 13, 2020   . Do not lift, push, pull, or carry anything over 10 pounds with the affected arm until 6 weeks (Friday October 15, 2020 ) after your procedure.   . Do NOT DRIVE until you have been seen for your wound check, or as long as instructed by your healthcare provider.   . Ask your healthcare provider when you can go back to work   INCISION/Dressing . If you are on a blood thinner such as Coumadin, Xarelto, Eliquis, Plavix, or Pradaxa please confirm with your provider when this should be resumed. 09/04/2020  . Monitor your defibrillator site for redness, swelling, and drainage. Call the device clinic at [phone] if you experience these symptoms or fever/chills.  . If your incision is sealed with Steri-strips or staples, you may shower 10 days after your procedure or when told by your provider. Do not remove the steri-strips or let the shower hit directly on your site. You may wash around your site with soap and water.    [name] Kitchen Avoid lotions, ointments, or perfumes over your incision until it is well-healed.  . You may use a hot tub or a pool AFTER your wound check appointment if the incision is completely closed.  . Your ICD is designed to protect you from life threatening heart rhythms. Because of this, you may receive a shock.   o 1 shock with no symptoms:  Call the office during business hours. o 1 shock with symptoms (chest pain, chest pressure, dizziness, lightheadedness, shortness of breath, overall feeling unwell):  Call 911. o If you experience 2 or more shocks in 24 hours:  Call 911. o If you receive a shock, you should not drive for 6 months per the [place] DMV IF you receive  appropriate therapy from your ICD.   . ICD Alerts:  Some alerts are vibratory and others beep. These are NOT emergencies. Please call our office to let us know. If this occurs at night or on weekends, it can wait until the next business day. Send a remote transmission.  . If your device is capable of reading fluid status (for heart failure), you will be offered monthly monitoring to review this with you.   DEVICE MANAGEMENT . Remote monitoring is used to monitor your ICD from home. This monitoring is scheduled every 91 days by our office. It allows [place] to keep an eye on the functioning of your device to ensure it is working properly. You will routinely see your Electrophysiologist annually (more often if necessary).   . You should receive your ID card for your new device in 4-8 weeks. Keep this card with you at all times once received. Consider wearing a medical alert bracelet or necklace.  . Your ICD  may be MRI compatible. This will be discussed at your next office visit/wound check.  You should avoid contact with strong electric or magnetic fields.    Do not use amateur (ham) radio equipment or electric (arc) welding torches. MP3 player headphones with magnets should not be used. Some devices are safe to use if held at least 12 inches (30 cm) from your defibrillator. These include power tools, lawn  mowers, and speakers. If you are unsure if something is safe to use, ask your health care provider.   When using your cell phone, hold it to the ear that is on the opposite side from the defibrillator. Do not leave your cell phone in a pocket over the defibrillator.   You may safely use electric blankets, heating pads, computers, and microwave ovens.  Call the office right away if:  You have chest pain.  You feel more than one shock.  You feel more short of breath than you have felt before.  You feel more light-headed than you have felt before.  Your incision starts to open up.  This  information is not intended to replace advice given to you by your health care provider. Make sure you discuss any questions you have with your health care provider.

## 2020-09-06 ENCOUNTER — Encounter (HOSPITAL_COMMUNITY): Payer: Self-pay | Admitting: Cardiology

## 2020-09-06 MED FILL — Gentamicin Sulfate Inj 40 MG/ML: INTRAMUSCULAR | Qty: 80 | Status: AC

## 2020-09-06 MED FILL — Lidocaine HCl Local Inj 1%: INTRAMUSCULAR | Qty: 80 | Status: AC

## 2020-09-07 ENCOUNTER — Other Ambulatory Visit (HOSPITAL_COMMUNITY): Payer: Self-pay

## 2020-09-07 NOTE — Progress Notes (Signed)
Paramedicine Encounter    Patient ID: [name], male    DOB: 1961-04-15, 59 y.o.   MRN: [id number]   Patient Care Team: Azzie Glatter, FNP as PCP - General (Family Medicine) [name], MD as PCP - Cardiology (Cardiology) [name], MD as PCP - Advanced Heart Failure (Cardiology) [name], MD as PCP - Electrophysiology (Cardiology) Valente David, RN as [place] [facility]  Patient Active Problem List   Diagnosis Date Noted  . Acute blood loss anemia   . Angiodysplasia of stomach   . Chronic anticoagulation   . CHF exacerbation ([place]) 06/15/2020  . AKI (acute kidney injury) ([place]) 06/15/2020  . CKD (chronic kidney disease), stage III ([place]) 06/15/2020  . Anemia due to chronic blood loss   . Gastric AVM   . Angiodysplasia of small intestine ([place])   . Acute on chronic systolic (congestive) heart failure ([place]) 04/05/2020  . Anemia 04/05/2020  . PAF (paroxysmal atrial fibrillation) ([place]) 04/05/2020  . OSA on CPAP 04/05/2020  . Syncope and collapse 04/05/2020  . Type 2 diabetes mellitus with hyperglycemia, with long-term current use of insulin ([place]) 12/03/2019  . Diabetic polyneuropathy associated with type 2 diabetes mellitus ([place]) 12/03/2019  . Hyperglycemia 12/03/2019  . History of hyperglycemia 12/03/2019  . Erectile dysfunction 12/03/2019  . CHF (congestive heart failure) ([place]) 10/11/2019  . Paranoid schizophrenia, chronic condition ([place]) 01/26/2015  . Severe recurrent major depressive disorder with psychotic features ([place]) 01/26/2015  . GAD (generalized anxiety disorder) 01/26/2015  . OCD (obsessive compulsive disorder) 01/26/2015  . Panic disorder without agoraphobia 01/26/2015  . Insomnia 01/26/2015    Current Outpatient Medications:  .  acetaminophen (TYLENOL) 500 MG tablet, Take 1 tablet (500 mg total) by mouth every 6 (six) hours as needed for mild pain or moderate pain. (Patient taking differently: Take 1,000 mg by  mouth every 6 (six) hours as needed for mild pain or moderate pain. ), Disp: 60 tablet, Rfl: 3 .  apixaban (ELIQUIS) 5 MG TABS tablet, Take 1 tablet (5 mg total) by mouth 2 (two) times daily., Disp: 180 tablet, Rfl: 3 .  atorvastatin (LIPITOR) 40 MG tablet, Take 1 tablet (40 mg total) by mouth daily at 6 PM., Disp: 90 tablet, Rfl: 3 .  blood glucose meter kit and supplies KIT, Dispense based on patient and insurance preference. Use up to four times daily as directed. (FOR ICD-9 250.00, 250.01)., Disp: 1 each, Rfl: 0 .  buPROPion (WELLBUTRIN SR) 100 MG 12 hr tablet, Take 1 tablet (100 mg total) by mouth 2 (two) times daily., Disp: 60 tablet, Rfl: 6 .  carvedilol (COREG) 12.5 MG tablet, Take 1.5 tablets (18.75 mg total) by mouth 2 (two) times daily with a meal., Disp: 90 tablet, Rfl: 6 .  cyclobenzaprine (FLEXERIL) 10 MG tablet, Take 10 mg by mouth daily as needed for muscle spasms. , Disp: , Rfl:  .  ferrous sulfate 325 (65 FE) MG tablet, Take 1 tablet (325 mg total) by mouth daily with breakfast. (Patient taking differently: Take 325 mg by mouth 2 (two) times daily with a meal. ), Disp: 30 tablet, Rfl: 3 .  furosemide (LASIX) 40 MG tablet, Take 2 tablets (80 mg total) by mouth 2 (two) times daily., Disp: 360 tablet, Rfl: 3 .  gabapentin (NEURONTIN) 300 MG capsule, TAKE 2 CAPSULES BY MOUTH 2 TIMES DAILY. (Patient taking differently: Take 600 mg by mouth 2 (two) times daily. ), Disp: 120 capsule, Rfl: 3 .  Insulin Lispro Prot & Lispro (HUMALOG MIX 75/25 KWIKPEN) (75-25) 100 UNIT/ML Kwikpen, Inject 30 Units into the skin 2 (two) times daily., Disp: 15 mL, Rfl: 11 .  Multiple Vitamins-Minerals (CENTRUM SILVER 50+MEN) TABS, Take 1 tablet by mouth daily., Disp: , Rfl:  .  pantoprazole (PROTONIX) 40 MG tablet, TAKE 1 TABLET (40 MG TOTAL) BY MOUTH 2 (TWO) TIMES DAILY., Disp: 60 tablet, Rfl: 1 .  potassium chloride SA (KLOR-CON M20) 20 MEQ tablet, Take 1 tablet (20 mEq total) by mouth daily., Disp: 30 tablet,  Rfl: 0 .  QUEtiapine (SEROQUEL) 200 MG tablet, Take 1 tablet (200 mg total) by mouth at bedtime., Disp: 30 tablet, Rfl: 6 .  sacubitril-valsartan (ENTRESTO) 97-103 MG, Take 1 tablet by mouth 2 (two) times daily., Disp: 60 tablet, Rfl: 5 .  sildenafil (VIAGRA) 100 MG tablet, Take 1 tablet (100 mg total) by mouth daily as needed for erectile dysfunction., Disp: 30 tablet, Rfl: 2 .  spironolactone (ALDACTONE) 25 MG tablet, Take 1 tablet (25 mg total) by mouth daily., Disp: 30 tablet, Rfl: 11 .  tetrahydrozoline 0.05 % ophthalmic solution, Place 1 drop into both eyes daily., Disp: , Rfl:  .  TRUEPLUS PEN NEEDLES 31G X 6 MM MISC, USE AS DIRECTED, Disp: 100 each, Rfl: 0 No Known Allergies   Social History   Socioeconomic History  . Marital status: Legally Separated    Spouse name: Not on file  . Number of children: Not on file  . Years of education: Not on file  . Highest education level: Not on file  Occupational History  . Not on file  Tobacco Use  . 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 Smokeless tobacco: Never Used  Vaping Use  . Vaping Use: Never used  Substance and Sexual Activity  . Alcohol use: Yes    Alcohol/week: 2.0 standard drinks    Types: 2 Cans of beer per week  . Drug use: No  . Sexual activity: Yes  Other Topics Concern  . Not on file  Social History Narrative  . Not on file   Social Determinants of Health   Financial Resource Strain: Low Risk   . Difficulty of Paying Living Expenses: Not very hard  Food Insecurity: No Food Insecurity  . Worried About Charity fundraiser in the Last Year: Never true  . Ran Out of Food in the Last Year: Never true  Transportation Needs: Unmet Transportation Needs  . Lack of Transportation (Medical): Yes  . Lack of Transportation (Non-Medical): Yes  Physical Activity:   . Days of Exercise per Week: Not on file  . Minutes of Exercise per Session: Not on file  Stress:   . Feeling of Stress : Not on file  Social Connections:   .  Frequency of Communication with Friends and Family: Not on file  . Frequency of Social Gatherings with Friends and Family: Not on file  . Attends Religious Services: Not on file  . Active Member of Clubs or Organizations: Not on file  . Attends Archivist Meetings: Not on file  . Marital Status: Not on file  Intimate Partner Violence:   . Fear of Current or Ex-Partner: Not on file  . Emotionally Abused: Not on file  . Physically Abused: Not on file  . Sexually Abused: Not on file    Physical Exam Vitals reviewed.  Constitutional:      Appearance: He is normal weight.  HENT:     Head: Normocephalic.  Nose: Nose normal.     Mouth/Throat:     Mouth: Mucous membranes are moist.     Pharynx: Oropharynx is clear.  Eyes:     Pupils: Pupils are equal, round, and reactive to light.  Cardiovascular:     Rate and Rhythm: Normal rate and regular rhythm.     Pulses: Normal pulses.     Heart sounds: Normal heart sounds.  Pulmonary:     Effort: Pulmonary effort is normal.     Breath sounds: Normal breath sounds.  Abdominal:     General: Abdomen is flat.     Palpations: Abdomen is soft.  Musculoskeletal:        General: Normal range of motion.     Cervical back: Normal range of motion.     Right lower leg: No edema.     Left lower leg: No edema.  Skin:    General: Skin is warm and dry.     Capillary Refill: Capillary refill takes less than 2 seconds.  Neurological:     General: No focal deficit present.     Mental Status: He is alert. Mental status is at baseline.  Psychiatric:        Mood and Affect: Mood normal.     Arrived for home visit for [name] who was alert and oriented reporting he was feeling okay but having some soreness in his chest due to his recent ICD implant. Site was bandaged properly. [name] had a small weight gain over the last week, he reports not eating as well as he should be. I educated [name] on same. I reviewed medications and pill box filled  accordingly. [name] and I reviewed upcoming appointments and he agreed to all. I assisted in rescheduling his oral surgery visit  which is now scheduled for November 1st at 2:00. Chris successfully received shipment from [place] for his insulin. He has 5 pens at home. [name] is asking if he can get an increase in his Seroquel due to interrupted sleep often. I advised him to reach out his PCP about same. He will ask at his upcoming appointment. Home visit complete. I will see patient in one week.   CBG- 344 (I advised [name] to take insulin as prescribed)   Refills- Wellbutrin     Future Appointments  Date Time Provider [place]  09/13/2020  8:20 AM Azzie Glatter, FNP [facility] None  09/14/2020  2:00 PM [facility] DEVICE 1 [facility]  09/15/2020  1:30 PM Valente David, RN [facility] None  11/02/2020  1:40 PM [name], MD [facility] None  12/03/2020  7:40 AM [facility] DEVICE REMOTES [facility]  12/08/2020  1:15 PM [name], MD [facility]  01/19/2021  9:00 AM Azzie Glatter, FNP [facility] None  03/04/2021  7:40 AM [facility] DEVICE REMOTES [facility]  06/03/2021  7:40 AM [facility] DEVICE REMOTES [facility]  09/02/2021  7:40 AM [facility] DEVICE REMOTES [facility]  12/02/2021  7:40 AM [facility] DEVICE REMOTES [facility]  03/03/2022  7:40 AM [facility] DEVICE REMOTES [facility]     ACTION: Home visit completed Next visit planned for one week

## 2020-09-08 MED FILL — BUPROPION HCL SR 100 MG TAB: 100 | 30 days supply | Qty: 60 | Fill #4

## 2020-09-13 ENCOUNTER — Ambulatory Visit (INDEPENDENT_AMBULATORY_CARE_PROVIDER_SITE_OTHER): Payer: Medicare Other | Admitting: Family Medicine

## 2020-09-13 ENCOUNTER — Encounter: Payer: Self-pay | Admitting: Family Medicine

## 2020-09-13 ENCOUNTER — Other Ambulatory Visit: Payer: Self-pay

## 2020-09-13 ENCOUNTER — Other Ambulatory Visit: Payer: Self-pay | Admitting: Family Medicine

## 2020-09-13 VITALS — BP 138/84 | HR 71 | Temp 97.3°F | Resp 17 | Ht 74.0 in | Wt 314.2 lb

## 2020-09-13 DIAGNOSIS — N529 Male erectile dysfunction, unspecified: Secondary | ICD-10-CM

## 2020-09-13 DIAGNOSIS — G8929 Other chronic pain: Secondary | ICD-10-CM

## 2020-09-13 DIAGNOSIS — Z95810 Presence of automatic (implantable) cardiac defibrillator: Secondary | ICD-10-CM

## 2020-09-13 DIAGNOSIS — M5442 Lumbago with sciatica, left side: Secondary | ICD-10-CM

## 2020-09-13 DIAGNOSIS — Z0000 Encounter for general adult medical examination without abnormal findings: Secondary | ICD-10-CM

## 2020-09-13 DIAGNOSIS — Z09 Encounter for follow-up examination after completed treatment for conditions other than malignant neoplasm: Secondary | ICD-10-CM

## 2020-09-13 DIAGNOSIS — E1165 Type 2 diabetes mellitus with hyperglycemia: Secondary | ICD-10-CM

## 2020-09-13 DIAGNOSIS — M5441 Lumbago with sciatica, right side: Secondary | ICD-10-CM

## 2020-09-13 DIAGNOSIS — F419 Anxiety disorder, unspecified: Secondary | ICD-10-CM

## 2020-09-13 DIAGNOSIS — Z23 Encounter for immunization: Secondary | ICD-10-CM | POA: Diagnosis not present

## 2020-09-13 DIAGNOSIS — Z794 Long term (current) use of insulin: Secondary | ICD-10-CM

## 2020-09-13 MED ORDER — ACETAMINOPHEN 500 MG PO TABS: 1000.0000 mg | ORAL_TABLET | Freq: Two times a day (BID) | ORAL | 3 refills | Status: DC

## 2020-09-13 MED ORDER — GABAPENTIN 300 MG PO CAPS: ORAL_CAPSULE | ORAL | 11 refills | Status: DC

## 2020-09-13 NOTE — Progress Notes (Signed)
Patient [facility] Follow Up  Subjective:  Patient ID: [name], male    DOB: Oct 12, 1961  Age: 59 y.o. MRN: [id number]  CC:  Chief Complaint  Patient presents with  . Back Pain    X5 wks  . Shoulder Pain    X5 wks  . Follow-up    Pt states he had a defibrillator put in a wk ago and he states it's giving him real bad pain. Pt states pain level is a 10.    HPI [name] is a 59 year old male who presents for Follow Up today.   Patient Active Problem List   Diagnosis Date Noted  . Acute blood loss anemia   . Angiodysplasia of stomach   . Chronic anticoagulation   . CHF exacerbation ([place]) 06/15/2020  . AKI (acute kidney injury) ([place]) 06/15/2020  . CKD (chronic kidney disease), stage III ([place]) 06/15/2020  . Anemia due to chronic blood loss   . Gastric AVM   . Angiodysplasia of small intestine ([place])   . Acute on chronic systolic (congestive) heart failure ([place]) 04/05/2020  . Anemia 04/05/2020  . PAF (paroxysmal atrial fibrillation) ([place]) 04/05/2020  . OSA on CPAP 04/05/2020  . Syncope and collapse 04/05/2020  . Type 2 diabetes mellitus with hyperglycemia, with long-term current use of insulin ([place]) 12/03/2019  . Diabetic polyneuropathy associated with type 2 diabetes mellitus ([place]) 12/03/2019  . Hyperglycemia 12/03/2019  . History of hyperglycemia 12/03/2019  . Erectile dysfunction 12/03/2019  . CHF (congestive heart failure) ([place]) 10/11/2019  . Paranoid schizophrenia, chronic condition ([place]) 01/26/2015  . Severe recurrent major depressive disorder with psychotic features ([place]) 01/26/2015  . GAD (generalized anxiety disorder) 01/26/2015  . OCD (obsessive compulsive disorder) 01/26/2015  . Panic disorder without agoraphobia 01/26/2015  . Insomnia 01/26/2015    Current Status: Since his last office visit, he is doing well with no complaints. He continues to have increasing lower back pain with  sciatica. He is currently taking Acetaminophen as needed and Gabapentin 600 mg BID. He denies fevers, chills, fatigue, recent infections, weight loss, and night sweats. He has not had any headaches, visual changes, dizziness, and falls. No chest pain, heart palpitations, cough and shortness of breath reported. Denies GI problems such as nausea, vomiting, diarrhea, and constipation. He has no reports of blood in stools, dysuria and hematuria. No depression or anxiety reported today. He is taking all medications as prescribed.   Past Medical History:  Diagnosis Date  . Anxiety   . CAD (coronary artery disease)   . CHF (congestive heart failure) ([place]) 09/2019  . Depression   . Diabetes mellitus   . Erectile dysfunction 11/2019  . H/O right heart catheterization 09/2019  . Hypertension   . Schizophrenia ([place])   . Sleep apnea    uses cpap    Past Surgical History:  Procedure Laterality Date  . CORONARY STENT PLACEMENT    . ENTEROSCOPY N/A 04/08/2020   Procedure: ENTEROSCOPY;  Surgeon: Doran Stabler, MD;  Location: [facility] ENDOSCOPY;  Service: Gastroenterology;  Laterality: N/A;  . ENTEROSCOPY N/A 06/17/2020   Procedure: ENTEROSCOPY;  Surgeon: Irene Shipper, MD;  Location: [facility] ENDOSCOPY;  Service: Endoscopy;  Laterality: N/A;  . HEMOSTASIS CLIP PLACEMENT  04/08/2020   Procedure: HEMOSTASIS CLIP PLACEMENT;  Surgeon: Doran Stabler, MD;  Location: [place];  Service: Gastroenterology;;  . HEMOSTASIS CONTROL  04/08/2020   Procedure: HEMOSTASIS CONTROL;  Surgeon: Doran Stabler, MD;  Location: [facility] END[facility]COPY;  Service: Gastroenterology;;  . Laretta Alstrom CONTROL  06/17/2020   Procedure: HEMOSTASIS CONTROL;  Surgeon: Irene Shipper, MD;  Location: [facility] ENDOSCOPY;  Service: Endoscopy;;  . HERNIA REPAIR    . ICD IMPLANT N/A 09/03/2020   Procedure: ICD IMPLANT;  Surgeon: [name], MD;  Location: [facility] CV LAB;  Service: Cardiovascular;  Laterality: N/A;  . RIGHT/LEFT HEART CATH AND  CORONARY ANGIOGRAPHY N/A 10/14/2019   Procedure: RIGHT/LEFT HEART CATH AND CORONARY ANGIOGRAPHY;  Surgeon: Belva Crome, MD;  Location: [place] [facility] CV LAB;  Service: Cardiovascular;  Laterality: N/A;    Family History  Problem Relation Age of Onset  . Heart failure Mother   . Mental illness Sister   . Mental illness Sister     Social History   Socioeconomic History  . Marital status: Legally Separated    Spouse name: Not on file  . Number of children: Not on file  . Years of education: Not on file  . Highest education level: Not on file  Occupational History  . Not on file  Tobacco Use  . 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 Smokeless tobacco: Never Used  Vaping Use  . Vaping Use: Never used  Substance and Sexual Activity  . Alcohol use: Yes    Alcohol/week: 2.0 standard drinks    Types: 2 Cans of beer per week  . Drug use: No  . Sexual activity: Yes  Other Topics Concern  . Not on file  Social History Narrative  . Not on file   Social Determinants of Health   Financial Resource Strain: Low Risk   . Difficulty of Paying Living Expenses: Not very hard  Food Insecurity: No Food Insecurity  . Worried About Charity fundraiser in the Last Year: Never true  . Ran Out of Food in the Last Year: Never true  Transportation Needs: Unmet Transportation Needs  . Lack of Transportation (Medical): Yes  . Lack of Transportation (Non-Medical): Yes  Physical Activity:   . Days of Exercise per Week: Not on file  . Minutes of Exercise per Session: Not on file  Stress:   . Feeling of Stress : Not on file  Social Connections:   . Frequency of Communication with Friends and Family: Not on file  . Frequency of Social Gatherings with Friends and Family: Not on file  . Attends Religious Services: Not on file  . Active Member of Clubs or Organizations: Not on file  . Attends Archivist Meetings: Not on file  . Marital Status: Not on file  Intimate Partner Violence:   . Fear  of Current or Ex-Partner: Not on file  . Emotionally Abused: Not on file  . Physically Abused: Not on file  . Sexually Abused: Not on file    Outpatient Medications Prior to Visit  Medication Sig Dispense Refill  . apixaban (ELIQUIS) 5 MG TABS tablet Take 1 tablet (5 mg total) by mouth 2 (two) times daily. 180 tablet 3  . atorvastatin (LIPITOR) 40 MG tablet Take 1 tablet (40 mg total) by mouth daily at 6 PM. 90 tablet 3  . blood glucose meter kit and supplies KIT Dispense based on patient and insurance preference. Use up to four times daily as directed. (FOR ICD-9 250.00, 250.01). 1 each 0  . buPROPion (WELLBUTRIN SR) 100 MG 12 hr tablet Take 1 tablet (100 mg total) by mouth 2 (two) times daily. 60 tablet  6  . carvedilol (COREG) 12.5 MG tablet Take 1.5 tablets (18.75 mg total) by mouth 2 (two) times daily with a meal. 90 tablet 6  . cyclobenzaprine (FLEXERIL) 10 MG tablet Take 10 mg by mouth daily as needed for muscle spasms.     . ferrous sulfate 325 (65 FE) MG tablet Take 1 tablet (325 mg total) by mouth daily with breakfast. (Patient taking differently: Take 325 mg by mouth 2 (two) times daily with a meal. ) 30 tablet 3  . furosemide (LASIX) 40 MG tablet Take 2 tablets (80 mg total) by mouth 2 (two) times daily. 360 tablet 3  . Insulin Lispro Prot & Lispro (HUMALOG MIX 75/25 KWIKPEN) (75-25) 100 UNIT/ML Kwikpen Inject 30 Units into the skin 2 (two) times daily. 15 mL 11  . Multiple Vitamins-Minerals (CENTRUM SILVER 50+MEN) TABS Take 1 tablet by mouth daily.    . pantoprazole (PROTONIX) 40 MG tablet TAKE 1 TABLET (40 MG TOTAL) BY MOUTH 2 (TWO) TIMES DAILY. 60 tablet 1  . QUEtiapine (SEROQUEL) 200 MG tablet Take 1 tablet (200 mg total) by mouth at bedtime. 30 tablet 6  . sacubitril-valsartan (ENTRESTO) 97-103 MG Take 1 tablet by mouth 2 (two) times daily. 60 tablet 5  . spironolactone (ALDACTONE) 25 MG tablet Take 1 tablet (25 mg total) by mouth daily. 30 tablet 11  . tetrahydrozoline 0.05 %  ophthalmic solution Place 1 drop into both eyes daily.    . TRUEPLUS PEN NEEDLES 31G X 6 MM MISC USE AS DIRECTED 100 each 0  . acetaminophen (TYLENOL) 500 MG tablet Take 1 tablet (500 mg total) by mouth every 6 (six) hours as needed for mild pain or moderate pain. (Patient taking differently: Take 1,000 mg by mouth every 6 (six) hours as needed for mild pain or moderate pain. ) 60 tablet 3  . gabapentin (NEURONTIN) 300 MG capsule TAKE 2 CAPSULES BY MOUTH 2 TIMES DAILY. (Patient taking differently: Take 600 mg by mouth 2 (two) times daily. ) 120 capsule 3  . potassium chloride SA (KLOR-CON M20) 20 MEQ tablet Take 1 tablet (20 mEq total) by mouth daily. 30 tablet 0  . sildenafil (VIAGRA) 100 MG tablet Take 1 tablet (100 mg total) by mouth daily as needed for erectile dysfunction. (Patient not taking: Reported on 09/13/2020) 30 tablet 2   No facility-administered medications prior to visit.    No Known Allergies  ROS Review of Systems  Constitutional: Negative.   HENT: Negative.   Eyes: Negative.   Respiratory: Negative.   Cardiovascular: Negative.   Gastrointestinal: Positive for abdominal distention (obese).  Endocrine: Negative.   Genitourinary: Negative.   Musculoskeletal: Positive for arthralgias (generalized ) and back pain (chronic).  Skin: Negative.   Allergic/Immunologic: Negative.   Neurological: Positive for dizziness (occasional ).  Hematological: Negative.   Psychiatric/Behavioral: Negative.       Objective:    Physical Exam Vitals and nursing note reviewed.  Constitutional:      Appearance: Normal appearance.  HENT:     Head: Normocephalic and atraumatic.     Nose: Nose normal.     Mouth/Throat:     Mouth: Mucous membranes are moist.     Pharynx: Oropharynx is clear.  Cardiovascular:     Rate and Rhythm: Normal rate.     Pulses: Normal pulses.     Heart sounds: Normal heart sounds.     Comments: defibrillator  Pulmonary:     Effort: Pulmonary effort is  normal.  Breath sounds: Normal breath sounds.  Abdominal:     General: Bowel sounds are normal.     Palpations: Abdomen is soft.  Musculoskeletal:     Cervical back: Normal range of motion and neck supple.     Comments: Limited ROM in spine  Skin:    General: Skin is warm and dry.  Neurological:     General: No focal deficit present.     Mental Status: He is alert and oriented to person, place, and time.  Psychiatric:        Mood and Affect: Mood normal.        Behavior: Behavior normal.        Thought Content: Thought content normal.        Judgment: Judgment normal.    BP 138/84 (BP Location: Left Arm, Patient Position: Sitting, Cuff Size: Large)   Pulse 71   Temp (!) 97.3 F (36.3 C)   Resp 17   Ht '6\' 2"'  (1.88 m)   Wt (!) 314 lb 3.2 oz (142.5 kg)   SpO2 96%   BMI 40.34 kg/m  Wt Readings from Last 3 Encounters:  09/13/20 (!) 314 lb 3.2 oz (142.5 kg)  09/07/20 (!) 319 lb (144.7 kg)  09/03/20 (!) 317 lb (143.8 kg)     Health Maintenance Due  Topic Date Due  . Hepatitis C Screening  Never done  . FOOT EXAM  Never done  . OPHTHALMOLOGY EXAM  Never done  . URINE MICROALBUMIN  Never done  . TETANUS/TDAP  Never done  . COLONOSCOPY  Never done  . COVID-19 Vaccine (2 - Pfizer 2-dose series) 04/03/2020  . INFLUENZA VACCINE  06/27/2020    There are no preventive care reminders to display for this patient.  Lab Results  Component Value Date   TSH 5.320 (H) 07/19/2020   Lab Results  Component Value Date   WBC 5.7 09/01/2020   HGB 11.9 (L) 09/01/2020   HCT 38.1 09/01/2020   MCV 82 09/01/2020   PLT 250 09/01/2020   Lab Results  Component Value Date   NA 138 09/01/2020   K 4.1 09/01/2020   CO2 28 09/01/2020   GLUCOSE 344 (H) 09/01/2020   BUN 26 09/01/2020   CREATININE 0.98 09/01/2020   BILITOT 0.8 04/05/2020   ALKPHOS 60 04/05/2020   AST 24 04/05/2020   ALT 26 04/05/2020   PROT 6.8 04/05/2020   ALBUMIN 3.1 (L) 04/05/2020   CALCIUM 9.0 09/01/2020    ANIONGAP 9 07/28/2020   Lab Results  Component Value Date   CHOL 116 07/19/2020   Lab Results  Component Value Date   HDL 42 07/19/2020   Lab Results  Component Value Date   LDLCALC 51 07/19/2020   Lab Results  Component Value Date   TRIG 128 07/19/2020   Lab Results  Component Value Date   CHOLHDL 2.8 07/19/2020   Lab Results  Component Value Date   HGBA1C 6.5 (A) 04/16/2020    Assessment & Plan:   1. Hospital discharge follow-up  2. S/P implantation of automatic cardioverter/defibrillator (AICD) Tolerated surgery well. Dressing is CD&I. He has follow up with Cardiology for assessment on 09/14/2020.   3. Chronic bilateral low back pain with bilateral sciatica We will initiate daily maintenance dose of Acetaminophen 1,000 mg BID and increase dosage of Gabapentin today.  - acetaminophen (TYLENOL) 500 MG tablet; Take 2 tablets (1,000 mg total) by mouth in the morning and at bedtime.  Dispense: 180 tablet; Refill: 3 -  gabapentin (NEURONTIN) 300 MG capsule; Take 2 capsules (total = 600 mg), by mouth, 3 times a day.  Dispense: 180 capsule; Refill: 11  4. Influenza vaccine needed - Flu Vaccine QUAD 6+ mos PF IM (Fluarix Quad PF)  5. Erectile dysfunction, unspecified erectile dysfunction type  6. Anxiety Stable. Continue medication as prescribed and to follow ups with Psychiatry as needed.  7. Type 2 diabetes mellitus with hyperglycemia, with long-term current use of insulin ([place]) He will continue medication as prescribed, to decrease foods/beverages high in sugars and carbs and follow Heart Healthy or DASH diet. Increase physical activity to at least 30 minutes cardio exercise daily.   8. Follow up He will follow up in 6 months.   Meds ordered this encounter  Medications  . acetaminophen (TYLENOL) 500 MG tablet    Sig: Take 2 tablets (1,000 mg total) by mouth in the morning and at bedtime.    Dispense:  180 tablet    Refill:  3  . gabapentin (NEURONTIN) 300 MG  capsule    Sig: Take 2 capsules (total = 600 mg), by mouth, 3 times a day.    Dispense:  180 capsule    Refill:  11    Orders Placed This Encounter  Procedures  . Flu Vaccine QUAD 6+ mos PF IM (Fluarix Quad PF)    Referral Orders  No referral(s) requested today    Kathe Becton,  MSN, FNP-BC [facility] [address] [place], [place] [address] [phone] fax  Problem List Items Addressed This Visit      Endocrine   Type 2 diabetes mellitus with hyperglycemia, with long-term current use of insulin ([place])     Other   Erectile dysfunction    Other Visit Diagnoses    Hospital discharge follow-up    -  Primary   S/P implantation of automatic cardioverter/defibrillator (AICD)       Chronic bilateral low back pain with bilateral sciatica       Relevant Medications   acetaminophen (TYLENOL) 500 MG tablet   gabapentin (NEURONTIN) 300 MG capsule   Influenza vaccine needed       Relevant Orders   Flu Vaccine QUAD 6+ mos PF IM (Fluarix Quad PF)   Anxiety       Follow up          Meds ordered this encounter  Medications  . acetaminophen (TYLENOL) 500 MG tablet    Sig: Take 2 tablets (1,000 mg total) by mouth in the morning and at bedtime.    Dispense:  180 tablet    Refill:  3  . gabapentin (NEURONTIN) 300 MG capsule    Sig: Take 2 capsules (total = 600 mg), by mouth, 3 times a day.    Dispense:  180 capsule    Refill:  11    Follow-up: Return in about 6 months (around 03/14/2021).    Azzie Glatter, FNP

## 2020-09-14 ENCOUNTER — Ambulatory Visit (INDEPENDENT_AMBULATORY_CARE_PROVIDER_SITE_OTHER): Payer: Medicare Other | Admitting: Emergency Medicine

## 2020-09-14 ENCOUNTER — Other Ambulatory Visit (HOSPITAL_COMMUNITY): Payer: Self-pay

## 2020-09-14 DIAGNOSIS — R55 Syncope and collapse: Secondary | ICD-10-CM | POA: Diagnosis not present

## 2020-09-14 DIAGNOSIS — Z95810 Presence of automatic (implantable) cardiac defibrillator: Secondary | ICD-10-CM | POA: Diagnosis not present

## 2020-09-14 LAB — CUP PACEART INCLINIC DEVICE CHECK
Battery Voltage: 3.12 V
Brady Statistic RV Percent Paced: 0 %
Date Time Interrogation Session: 20211019142258
HighPow Impedance: 77 Ohm
Implantable Lead Implant Date: 20211008
Implantable Lead Location: 753860
Implantable Lead Model: 436910
Implantable Lead Serial Number: 81404997
Implantable Pulse Generator Implant Date: 20211008
Lead Channel Impedance Value: 540 Ohm
Lead Channel Pacing Threshold Amplitude: 0.5 V
Lead Channel Pacing Threshold Pulse Width: 0.4 ms
Lead Channel Sensing Intrinsic Amplitude: 24.2 mV
Lead Channel Sensing Intrinsic Amplitude: 5.3 mV
Lead Channel Setting Pacing Amplitude: 3 V
Lead Channel Setting Pacing Pulse Width: 0.4 ms
Lead Channel Setting Sensing Sensitivity: 0.8 mV
Pulse Gen Model: 429525
Pulse Gen Serial Number: 84810752

## 2020-09-14 MED FILL — QUETIAPINE FUMARATE 200 MG: 200 | 30 days supply | Qty: 30 | Fill #1

## 2020-09-14 NOTE — Progress Notes (Signed)
Paramedicine Encounter    Patient ID: [name], male    DOB: 03/30/1961, 59 y.o.   MRN: [id number]   Patient Care Team: Azzie Glatter, FNP as PCP - General (Family Medicine) [name], MD as PCP - Cardiology (Cardiology) [name], MD as PCP - Advanced Heart Failure (Cardiology) [name], MD as PCP - Electrophysiology (Cardiology) Valente David, RN as [place] Management  Patient Active Problem List   Diagnosis Date Noted  . Acute blood loss anemia   . Angiodysplasia of stomach   . Chronic anticoagulation   . CHF exacerbation ([place]) 06/15/2020  . AKI (acute kidney injury) ([place]) 06/15/2020  . CKD (chronic kidney disease), stage III ([place]) 06/15/2020  . Anemia due to chronic blood loss   . Gastric AVM   . Angiodysplasia of small intestine ([place])   . Acute on chronic systolic (congestive) heart failure ([place]) 04/05/2020  . Anemia 04/05/2020  . PAF (paroxysmal atrial fibrillation) ([place]) 04/05/2020  . OSA on CPAP 04/05/2020  . Syncope and collapse 04/05/2020  . Type 2 diabetes mellitus with hyperglycemia, with long-term current use of insulin ([place]) 12/03/2019  . Diabetic polyneuropathy associated with type 2 diabetes mellitus ([place]) 12/03/2019  . Hyperglycemia 12/03/2019  . History of hyperglycemia 12/03/2019  . Erectile dysfunction 12/03/2019  . CHF (congestive heart failure) ([place]) 10/11/2019  . Paranoid schizophrenia, chronic condition ([place]) 01/26/2015  . Severe recurrent major depressive disorder with psychotic features ([place]) 01/26/2015  . GAD (generalized anxiety disorder) 01/26/2015  . OCD (obsessive compulsive disorder) 01/26/2015  . Panic disorder without agoraphobia 01/26/2015  . Insomnia 01/26/2015    Current Outpatient Medications:  .  acetaminophen (TYLENOL) 500 MG tablet, Take 2 tablets (1,000 mg total) by mouth in the morning and at bedtime., Disp: 180 tablet, Rfl: 3 .  apixaban (ELIQUIS) 5 MG TABS tablet, Take 1  tablet (5 mg total) by mouth 2 (two) times daily., Disp: 180 tablet, Rfl: 3 .  atorvastatin (LIPITOR) 40 MG tablet, Take 1 tablet (40 mg total) by mouth daily at 6 PM., Disp: 90 tablet, Rfl: 3 .  blood glucose meter kit and supplies KIT, Dispense based on patient and insurance preference. Use up to four times daily as directed. (FOR ICD-9 250.00, 250.01)., Disp: 1 each, Rfl: 0 .  buPROPion (WELLBUTRIN SR) 100 MG 12 hr tablet, Take 1 tablet (100 mg total) by mouth 2 (two) times daily., Disp: 60 tablet, Rfl: 6 .  carvedilol (COREG) 12.5 MG tablet, Take 1.5 tablets (18.75 mg total) by mouth 2 (two) times daily with a meal., Disp: 90 tablet, Rfl: 6 .  cyclobenzaprine (FLEXERIL) 10 MG tablet, Take 10 mg by mouth daily as needed for muscle spasms. , Disp: , Rfl:  .  ferrous sulfate 325 (65 FE) MG tablet, Take 1 tablet (325 mg total) by mouth daily with breakfast. (Patient taking differently: Take 325 mg by mouth 2 (two) times daily with a meal. ), Disp: 30 tablet, Rfl: 3 .  furosemide (LASIX) 40 MG tablet, Take 2 tablets (80 mg total) by mouth 2 (two) times daily., Disp: 360 tablet, Rfl: 3 .  gabapentin (NEURONTIN) 300 MG capsule, Take 2 capsules (total = 600 mg), by mouth, 3 times a day., Disp: 180 capsule, Rfl: 11 .  Insulin Lispro Prot & Lispro (HUMALOG MIX 75/25 KWIKPEN) (75-25) 100 UNIT/ML Kwikpen, Inject 30 Units into the skin 2 (two) times daily., Disp: 15 mL, Rfl: 11 .  Multiple Vitamins-Minerals (CENTRUM SILVER 50+MEN)  TABS, Take 1 tablet by mouth daily., Disp: , Rfl:  .  pantoprazole (PROTONIX) 40 MG tablet, TAKE 1 TABLET (40 MG TOTAL) BY MOUTH 2 (TWO) TIMES DAILY., Disp: 60 tablet, Rfl: 1 .  potassium chloride SA (KLOR-CON M20) 20 MEQ tablet, Take 1 tablet (20 mEq total) by mouth daily., Disp: 30 tablet, Rfl: 0 .  QUEtiapine (SEROQUEL) 200 MG tablet, Take 1 tablet (200 mg total) by mouth at bedtime., Disp: 30 tablet, Rfl: 6 .  sacubitril-valsartan (ENTRESTO) 97-103 MG, Take 1 tablet by mouth 2  (two) times daily., Disp: 60 tablet, Rfl: 5 .  sildenafil (VIAGRA) 100 MG tablet, Take 1 tablet (100 mg total) by mouth daily as needed for erectile dysfunction., Disp: 30 tablet, Rfl: 2 .  spironolactone (ALDACTONE) 25 MG tablet, Take 1 tablet (25 mg total) by mouth daily., Disp: 30 tablet, Rfl: 11 .  tetrahydrozoline 0.05 % ophthalmic solution, Place 1 drop into both eyes daily., Disp: , Rfl:  .  TRUEPLUS PEN NEEDLES 31G X 6 MM MISC, USE AS DIRECTED, Disp: 100 each, Rfl: 0 No Known Allergies   Social History   Socioeconomic History  . Marital status: Legally Separated    Spouse name: Not on file  . Number of children: Not on file  . Years of education: Not on file  . Highest education level: Not on file  Occupational History  . Not on file  Tobacco Use  . 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 Smokeless tobacco: Never Used  Vaping Use  . Vaping Use: Never used  Substance and Sexual Activity  . Alcohol use: Yes    Alcohol/week: 2.0 standard drinks    Types: 2 Cans of beer per week  . Drug use: No  . Sexual activity: Yes  Other Topics Concern  . Not on file  Social History Narrative  . Not on file   Social Determinants of Health   Financial Resource Strain: Low Risk   . Difficulty of Paying Living Expenses: Not very hard  Food Insecurity: No Food Insecurity  . Worried About Charity fundraiser in the Last Year: Never true  . Ran Out of Food in the Last Year: Never true  Transportation Needs: Unmet Transportation Needs  . Lack of Transportation (Medical): Yes  . Lack of Transportation (Non-Medical): Yes  Physical Activity:   . Days of Exercise per Week: Not on file  . Minutes of Exercise per Session: Not on file  Stress:   . Feeling of Stress : Not on file  Social Connections:   . Frequency of Communication with Friends and Family: Not on file  . Frequency of Social Gatherings with Friends and Family: Not on file  . Attends Religious Services: Not on file  . Active  Member of Clubs or Organizations: Not on file  . Attends Archivist Meetings: Not on file  . Marital Status: Not on file  Intimate Partner Violence:   . Fear of Current or Ex-Partner: Not on file  . Emotionally Abused: Not on file  . Physically Abused: Not on file  . Sexually Abused: Not on file    Physical Exam   Met with [name] for a med rec today where medications were reviewed and confirmed. Pill box filled accordingly. Chris requested I assist in scheduling Covid Vaccine Booster. Same is for November 15th at 0930 on [place]. [name] is also getting his shingles vaccine on Oct 21 at 0930 at the same location. [name] agreed to visit  in one week. Home visit complete.   Refills:  Lasix    Future Appointments  Date Time Provider [place]  09/15/2020  1:30 PM Valente David, RN [facility] None  11/02/2020  1:40 PM [name], MD [facility] None  12/03/2020  7:40 AM [facility] DEVICE REMOTES [facility]  12/08/2020  1:15 PM [name], MD [facility]  01/19/2021  9:00 AM Azzie Glatter, FNP [facility] None  03/04/2021  7:40 AM [facility] DEVICE REMOTES [facility]  03/14/2021  9:00 AM Azzie Glatter, FNP [facility] None  06/03/2021  7:40 AM [facility] DEVICE REMOTES [facility]  09/02/2021  7:40 AM [facility] DEVICE REMOTES [facility]  12/02/2021  7:40 AM [facility] DEVICE REMOTES [facility]  03/03/2022  7:40 AM [facility] DEVICE REMOTES [facility]     ACTION: Home visit completed Next visit planned for one week

## 2020-09-14 NOTE — Progress Notes (Signed)
Wound check appointment. Steri-strips removed. Wound without redness or edema. Incision edges approximated, wound well healed. Normal device function. Thresholds, sensing, and impedances consistent with implant measurements. Device programmed at 3.5V for extra safety margin until 3 month visit. Histogram distribution appropriate for patient and level of activity. No ventricular arrhythmias noted. Patient educated about wound care, arm mobility, lifting restrictions, shock plan. ROV with Dr Quentin Ore 12/09/19. Enrolled in remote monitoring .

## 2020-09-15 ENCOUNTER — Other Ambulatory Visit: Payer: Self-pay | Admitting: *Deleted

## 2020-09-15 MED FILL — FUROSEMIDE 40 MG TAB: 40 | 30 days supply | Qty: 120 | Fill #2

## 2020-09-15 NOTE — Patient Outreach (Signed)
[facility]) Care Management  09/15/2020  [name] 1961/01/06 [id number]   Call placed to member to follow up on ICD placement and management of heart failure.  State he has been doing well since procedure, no complications.  He was seen in the office yesterday, steri strips were removed.  Scheduled for follow up on 12/08/20.  Will follow up with heart failure clinic on 12/7.  Last seen by PCP on 10/18.  Continue to have weekly visits from EMT with paramedicine, last home visit was yesterday, next one planned for next Tuesday.  Report weight has been stable, today was 315 pounds.  Verbalizes understanding of low sodium diet, working to continue adherence.  Also working to formulate an exercise regime but waiting until at least a month post procedure.  Denies any urgent concerns, agrees to follow up within the next month.  Goals Addressed            This Visit's Progress   . [facility] - Make and Keep All Appointments       Follow Up Date 11/20   - ask family or friend for a ride - call to cancel if needed - keep a calendar with appointment dates    Why is this important?   Part of staying healthy is seeing the doctor for follow-up care.  If you forget your appointments, there are some things you can do to stay on track.    Notes:     . [facility] - Track and Manage Activity and Exertion       Follow Up Date 12/15   - drink water to stay hydrated during exercise - follow activity or exercise plan - make an activity or exercise plan - pace activity allowing for rest - track symptoms during activity in diary    Why is this important?   Exercising is very important when managing your heart failure.  It will help your heart get stronger.    Notes:       Valente David, RN, MSN [name] [phone]

## 2020-09-21 ENCOUNTER — Other Ambulatory Visit (HOSPITAL_COMMUNITY): Payer: Self-pay

## 2020-09-21 NOTE — Progress Notes (Signed)
Paramedicine Encounter    Patient ID: [name], male    DOB: 1960/12/12, 59 y.o.   MRN: [id number]   Patient Care Team: Azzie Glatter, FNP as PCP - General (Family Medicine) [name], MD as PCP - Cardiology (Cardiology) [name], MD as PCP - Advanced Heart Failure (Cardiology) [name], MD as PCP - Electrophysiology (Cardiology) Valente David, RN as [place] Management  Patient Active Problem List   Diagnosis Date Noted  . Acute blood loss anemia   . Angiodysplasia of stomach   . Chronic anticoagulation   . CHF exacerbation ([place]) 06/15/2020  . AKI (acute kidney injury) ([place]) 06/15/2020  . CKD (chronic kidney disease), stage III ([place]) 06/15/2020  . Anemia due to chronic blood loss   . Gastric AVM   . Angiodysplasia of small intestine ([place])   . Acute on chronic systolic (congestive) heart failure ([place]) 04/05/2020  . Anemia 04/05/2020  . PAF (paroxysmal atrial fibrillation) ([place]) 04/05/2020  . OSA on CPAP 04/05/2020  . Syncope and collapse 04/05/2020  . Type 2 diabetes mellitus with hyperglycemia, with long-term current use of insulin ([place]) 12/03/2019  . Diabetic polyneuropathy associated with type 2 diabetes mellitus ([place]) 12/03/2019  . Hyperglycemia 12/03/2019  . History of hyperglycemia 12/03/2019  . Erectile dysfunction 12/03/2019  . CHF (congestive heart failure) ([place]) 10/11/2019  . Paranoid schizophrenia, chronic condition ([place]) 01/26/2015  . Severe recurrent major depressive disorder with psychotic features ([place]) 01/26/2015  . GAD (generalized anxiety disorder) 01/26/2015  . OCD (obsessive compulsive disorder) 01/26/2015  . Panic disorder without agoraphobia 01/26/2015  . Insomnia 01/26/2015    Current Outpatient Medications:  .  acetaminophen (TYLENOL) 500 MG tablet, Take 2 tablets (1,000 mg total) by mouth in the morning and at bedtime., Disp: 180 tablet, Rfl: 3 .  apixaban (ELIQUIS) 5 MG TABS tablet, Take 1  tablet (5 mg total) by mouth 2 (two) times daily., Disp: 180 tablet, Rfl: 3 .  atorvastatin (LIPITOR) 40 MG tablet, Take 1 tablet (40 mg total) by mouth daily at 6 PM., Disp: 90 tablet, Rfl: 3 .  blood glucose meter kit and supplies KIT, Dispense based on patient and insurance preference. Use up to four times daily as directed. (FOR ICD-9 250.00, 250.01)., Disp: 1 each, Rfl: 0 .  buPROPion (WELLBUTRIN SR) 100 MG 12 hr tablet, Take 1 tablet (100 mg total) by mouth 2 (two) times daily., Disp: 60 tablet, Rfl: 6 .  carvedilol (COREG) 12.5 MG tablet, Take 1.5 tablets (18.75 mg total) by mouth 2 (two) times daily with a meal., Disp: 90 tablet, Rfl: 6 .  cyclobenzaprine (FLEXERIL) 10 MG tablet, Take 10 mg by mouth daily as needed for muscle spasms. , Disp: , Rfl:  .  ferrous sulfate 325 (65 FE) MG tablet, Take 1 tablet (325 mg total) by mouth daily with breakfast. (Patient taking differently: Take 325 mg by mouth 2 (two) times daily with a meal. ), Disp: 30 tablet, Rfl: 3 .  furosemide (LASIX) 40 MG tablet, Take 2 tablets (80 mg total) by mouth 2 (two) times daily., Disp: 360 tablet, Rfl: 3 .  gabapentin (NEURONTIN) 300 MG capsule, Take 2 capsules (total = 600 mg), by mouth, 3 times a day., Disp: 180 capsule, Rfl: 11 .  Insulin Lispro Prot & Lispro (HUMALOG MIX 75/25 KWIKPEN) (75-25) 100 UNIT/ML Kwikpen, Inject 30 Units into the skin 2 (two) times daily., Disp: 15 mL, Rfl: 11 .  Multiple Vitamins-Minerals (CENTRUM SILVER 50+MEN)  TABS, Take 1 tablet by mouth daily., Disp: , Rfl:  .  pantoprazole (PROTONIX) 40 MG tablet, TAKE 1 TABLET (40 MG TOTAL) BY MOUTH 2 (TWO) TIMES DAILY., Disp: 60 tablet, Rfl: 1 .  potassium chloride SA (KLOR-CON M20) 20 MEQ tablet, Take 1 tablet (20 mEq total) by mouth daily., Disp: 30 tablet, Rfl: 0 .  QUEtiapine (SEROQUEL) 200 MG tablet, Take 1 tablet (200 mg total) by mouth at bedtime., Disp: 30 tablet, Rfl: 6 .  sacubitril-valsartan (ENTRESTO) 97-103 MG, Take 1 tablet by mouth 2  (two) times daily., Disp: 60 tablet, Rfl: 5 .  sildenafil (VIAGRA) 100 MG tablet, Take 1 tablet (100 mg total) by mouth daily as needed for erectile dysfunction., Disp: 30 tablet, Rfl: 2 .  spironolactone (ALDACTONE) 25 MG tablet, Take 1 tablet (25 mg total) by mouth daily., Disp: 30 tablet, Rfl: 11 .  tetrahydrozoline 0.05 % ophthalmic solution, Place 1 drop into both eyes daily., Disp: , Rfl:  .  TRUEPLUS PEN NEEDLES 31G X 6 MM MISC, USE AS DIRECTED, Disp: 100 each, Rfl: 0 No Known Allergies   Social History   Socioeconomic History  . Marital status: Legally Separated    Spouse name: Not on file  . Number of children: Not on file  . Years of education: Not on file  . Highest education level: Not on file  Occupational History  . Not on file  Tobacco Use  . 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 Smokeless tobacco: Never Used  Vaping Use  . Vaping Use: Never used  Substance and Sexual Activity  . Alcohol use: Yes    Alcohol/week: 2.0 standard drinks    Types: 2 Cans of beer per week  . Drug use: No  . Sexual activity: Yes  Other Topics Concern  . Not on file  Social History Narrative  . Not on file   Social Determinants of Health   Financial Resource Strain: Low Risk   . Difficulty of Paying Living Expenses: Not very hard  Food Insecurity: No Food Insecurity  . Worried About Charity fundraiser in the Last Year: Never true  . Ran Out of Food in the Last Year: Never true  Transportation Needs: Unmet Transportation Needs  . Lack of Transportation (Medical): Yes  . Lack of Transportation (Non-Medical): Yes  Physical Activity:   . Days of Exercise per Week: Not on file  . Minutes of Exercise per Session: Not on file  Stress:   . Feeling of Stress : Not on file  Social Connections:   . Frequency of Communication with Friends and Family: Not on file  . Frequency of Social Gatherings with Friends and Family: Not on file  . Attends Religious Services: Not on file  . Active  Member of Clubs or Organizations: Not on file  . Attends Archivist Meetings: Not on file  . Marital Status: Not on file  Intimate Partner Violence:   . Fear of Current or Ex-Partner: Not on file  . Emotionally Abused: Not on file  . Physically Abused: Not on file  . Sexually Abused: Not on file    Physical Exam Vitals reviewed.  HENT:     Head: Normocephalic.     Nose: Nose normal.     Mouth/Throat:     Mouth: Mucous membranes are moist.     Pharynx: Oropharynx is clear.  Eyes:     Pupils: Pupils are equal, round, and reactive to light.  Cardiovascular:  Rate and Rhythm: Normal rate and regular rhythm.     Pulses: Normal pulses.     Heart sounds: Normal heart sounds.  Pulmonary:     Effort: Pulmonary effort is normal.     Breath sounds: Normal breath sounds.  Abdominal:     General: Abdomen is flat.     Palpations: Abdomen is soft.  Musculoskeletal:        General: Normal range of motion.     Cervical back: Normal range of motion.     Right lower leg: No edema.     Left lower leg: No edema.  Skin:    General: Skin is warm and dry.     Capillary Refill: Capillary refill takes less than 2 seconds.  Neurological:     General: No focal deficit present.     Mental Status: He is alert. Mental status is at baseline.  Psychiatric:        Mood and Affect: Mood normal.      Arrived for home visit for [name] who was alert and oriented reporting he was feeling "okay". [name] had a 4 lb weight gain over the last week and he missed all of his medications on Saturday 10/23. [name] also admits to eating bacon and salty snacks. I educated [name] on same and advised him to be sure to eat low sodium diet, he verbalized understanding. Medications were verified and confirmed. Pill box filled accordingly. [name] was given a 14 day trial of [employer] however his current phone isn't compatible and [name] cannot use the Freestyle without a compatible phone. [name] states he is  going to upgrade his cell phone. Vitals are as noted. No edema noted. Lung sounds clear. Home visit complete for [facility]. I will see him in one week.   Refills:  Carvedilol Flexiril Gabapentin     Future Appointments  Date Time Provider [place]  11/02/2020  1:40 PM [name], MD [facility] None  12/03/2020  7:40 AM [facility] DEVICE REMOTES [facility]  12/08/2020  1:15 PM [name], MD [facility]  01/19/2021  9:00 AM Azzie Glatter, FNP [facility] None  03/04/2021  7:40 AM [facility] DEVICE REMOTES [facility]  03/14/2021  9:00 AM Azzie Glatter, FNP [facility] None  06/03/2021  7:40 AM [facility] DEVICE REMOTES [facility]  09/02/2021  7:40 AM [facility] DEVICE REMOTES [facility]  12/02/2021  7:40 AM [facility] DEVICE REMOTES [facility]  03/03/2022  7:40 AM [facility] DEVICE REMOTES [facility]     ACTION: Home visit completed Next visit planned for one week

## 2020-09-22 MED FILL — CYCLOBENZAPRINE 10 MG TAB: 10 | 10 days supply | Qty: 30 | Fill #2

## 2020-09-22 MED FILL — CARVEDILOL 12.5 MG TABLET: 12.5 | 30 days supply | Qty: 90 | Fill #1

## 2020-09-28 ENCOUNTER — Other Ambulatory Visit (HOSPITAL_COMMUNITY): Payer: Self-pay

## 2020-09-28 NOTE — Progress Notes (Signed)
Paramedicine Encounter    Patient ID: [name], male    DOB: [birth year], 60 y.o.   MRN: [id number]   Patient Care Team: Azzie Glatter, FNP as PCP - General (Family Medicine) [name], MD as PCP - Cardiology (Cardiology) [name], MD as PCP - Advanced Heart Failure (Cardiology) [name], MD as PCP - Electrophysiology (Cardiology) Valente David, RN as [place] Management  Patient Active Problem List   Diagnosis Date Noted  . Acute blood loss anemia   . Angiodysplasia of stomach   . Chronic anticoagulation   . CHF exacerbation ([place]) 06/15/2020  . AKI (acute kidney injury) ([place]) 06/15/2020  . CKD (chronic kidney disease), stage III ([place]) 06/15/2020  . Anemia due to chronic blood loss   . Gastric AVM   . Angiodysplasia of small intestine ([place])   . Acute on chronic systolic (congestive) heart failure ([place]) 04/05/2020  . Anemia 04/05/2020  . PAF (paroxysmal atrial fibrillation) ([place]) 04/05/2020  . OSA on CPAP 04/05/2020  . Syncope and collapse 04/05/2020  . Type 2 diabetes mellitus with hyperglycemia, with long-term current use of insulin ([place]) 12/03/2019  . Diabetic polyneuropathy associated with type 2 diabetes mellitus ([place]) 12/03/2019  . Hyperglycemia 12/03/2019  . History of hyperglycemia 12/03/2019  . Erectile dysfunction 12/03/2019  . CHF (congestive heart failure) ([place]) 10/11/2019  . Paranoid schizophrenia, chronic condition ([place]) 01/26/2015  . Severe recurrent major depressive disorder with psychotic features ([place]) 01/26/2015  . GAD (generalized anxiety disorder) 01/26/2015  . OCD (obsessive compulsive disorder) 01/26/2015  . Panic disorder without agoraphobia 01/26/2015  . Insomnia 01/26/2015    Current Outpatient Medications:  .  acetaminophen (TYLENOL) 500 MG tablet, Take 2 tablets (1,000 mg total) by mouth in the morning and at bedtime., Disp: 180 tablet, Rfl: 3 .  apixaban (ELIQUIS) 5 MG TABS tablet, Take 1  tablet (5 mg total) by mouth 2 (two) times daily., Disp: 180 tablet, Rfl: 3 .  atorvastatin (LIPITOR) 40 MG tablet, Take 1 tablet (40 mg total) by mouth daily at 6 PM., Disp: 90 tablet, Rfl: 3 .  blood glucose meter kit and supplies KIT, Dispense based on patient and insurance preference. Use up to four times daily as directed. (FOR ICD-9 250.00, 250.01)., Disp: 1 each, Rfl: 0 .  buPROPion (WELLBUTRIN SR) 100 MG 12 hr tablet, Take 1 tablet (100 mg total) by mouth 2 (two) times daily., Disp: 60 tablet, Rfl: 6 .  carvedilol (COREG) 12.5 MG tablet, Take 1.5 tablets (18.75 mg total) by mouth 2 (two) times daily with a meal., Disp: 90 tablet, Rfl: 6 .  cyclobenzaprine (FLEXERIL) 10 MG tablet, Take 10 mg by mouth daily as needed for muscle spasms.  (Patient not taking: Reported on 09/21/2020), Disp: , Rfl:  .  ferrous sulfate 325 (65 FE) MG tablet, Take 1 tablet (325 mg total) by mouth daily with breakfast. (Patient taking differently: Take 325 mg by mouth 2 (two) times daily with a meal. ), Disp: 30 tablet, Rfl: 3 .  furosemide (LASIX) 40 MG tablet, Take 2 tablets (80 mg total) by mouth 2 (two) times daily., Disp: 360 tablet, Rfl: 3 .  gabapentin (NEURONTIN) 300 MG capsule, Take 2 capsules (total = 600 mg), by mouth, 3 times a day., Disp: 180 capsule, Rfl: 11 .  Insulin Lispro Prot & Lispro (HUMALOG MIX 75/25 KWIKPEN) (75-25) 100 UNIT/ML Kwikpen, Inject 30 Units into the skin 2 (two) times daily., Disp: 15 mL, Rfl: 11 .  Multiple Vitamins-Minerals (CENTRUM SILVER 50+MEN) TABS, Take 1 tablet by mouth daily., Disp: , Rfl:  .  pantoprazole (PROTONIX) 40 MG tablet, TAKE 1 TABLET (40 MG TOTAL) BY MOUTH 2 (TWO) TIMES DAILY., Disp: 60 tablet, Rfl: 1 .  potassium chloride SA (KLOR-CON M20) 20 MEQ tablet, Take 1 tablet (20 mEq total) by mouth daily., Disp: 30 tablet, Rfl: 0 .  QUEtiapine (SEROQUEL) 200 MG tablet, Take 1 tablet (200 mg total) by mouth at bedtime., Disp: 30 tablet, Rfl: 6 .  sacubitril-valsartan  (ENTRESTO) 97-103 MG, Take 1 tablet by mouth 2 (two) times daily., Disp: 60 tablet, Rfl: 5 .  sildenafil (VIAGRA) 100 MG tablet, Take 1 tablet (100 mg total) by mouth daily as needed for erectile dysfunction., Disp: 30 tablet, Rfl: 2 .  spironolactone (ALDACTONE) 25 MG tablet, Take 1 tablet (25 mg total) by mouth daily., Disp: 30 tablet, Rfl: 11 .  tetrahydrozoline 0.05 % ophthalmic solution, Place 1 drop into both eyes daily., Disp: , Rfl:  .  TRUEPLUS PEN NEEDLES 31G X 6 MM MISC, USE AS DIRECTED, Disp: 100 each, Rfl: 0 No Known Allergies   Social History   Socioeconomic History  . Marital status: Legally Separated    Spouse name: Not on file  . Number of children: Not on file  . Years of education: Not on file  . Highest education level: Not on file  Occupational History  . Not on file  Tobacco Use  . 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 Smokeless tobacco: Never Used  Vaping Use  . Vaping Use: Never used  Substance and Sexual Activity  . Alcohol use: Yes    Alcohol/week: 2.0 standard drinks    Types: 2 Cans of beer per week  . Drug use: No  . Sexual activity: Yes  Other Topics Concern  . Not on file  Social History Narrative  . Not on file   Social Determinants of Health   Financial Resource Strain: Low Risk   . Difficulty of Paying Living Expenses: Not very hard  Food Insecurity: No Food Insecurity  . Worried About Charity fundraiser in the Last Year: Never true  . Ran Out of Food in the Last Year: Never true  Transportation Needs: Unmet Transportation Needs  . Lack of Transportation (Medical): Yes  . Lack of Transportation (Non-Medical): Yes  Physical Activity:   . Days of Exercise per Week: Not on file  . Minutes of Exercise per Session: Not on file  Stress:   . Feeling of Stress : Not on file  Social Connections:   . Frequency of Communication with Friends and Family: Not on file  . Frequency of Social Gatherings with Friends and Family: Not on file  . Attends  Religious Services: Not on file  . Active Member of Clubs or Organizations: Not on file  . Attends Archivist Meetings: Not on file  . Marital Status: Not on file  Intimate Partner Violence:   . Fear of Current or Ex-Partner: Not on file  . Emotionally Abused: Not on file  . Physically Abused: Not on file  . Sexually Abused: Not on file    Physical Exam Vitals reviewed.  Constitutional:      Appearance: He is normal weight.  HENT:     Head: Normocephalic.     Nose: Nose normal.     Mouth/Throat:     Mouth: Mucous membranes are moist.     Pharynx: Oropharynx is clear.  Eyes:  Pupils: Pupils are equal, round, and reactive to light.  Cardiovascular:     Rate and Rhythm: Normal rate and regular rhythm.     Pulses: Normal pulses.     Heart sounds: Normal heart sounds.  Pulmonary:     Effort: Pulmonary effort is normal.     Breath sounds: Normal breath sounds.  Abdominal:     General: Abdomen is flat.     Palpations: Abdomen is soft.  Musculoskeletal:        General: Normal range of motion.     Cervical back: Normal range of motion.     Right lower leg: No edema.     Left lower leg: No edema.  Skin:    General: Skin is warm and dry.     Capillary Refill: Capillary refill takes less than 2 seconds.  Neurological:     General: No focal deficit present.     Mental Status: He is alert. Mental status is at baseline.  Psychiatric:        Mood and Affect: Mood normal.      Arrived for home visit for [name] who was alert and oriented reported he was tired and feeling sluggish today. I reviewed [name]'s medications and verified same. Two pill boxes filled accordingly due to my absence next week. [name] understood and agreed with plan. I reviewed upcoming appointments. [name] agreed with plan. [name] asked if I could reach out to his Oral Surgery to communicate doctors and medical history. I provided them with same. Staff at Oral Surgery reports once they get clearance from  medical doctors as well as his general dentist. I will relay this information to [place]. Home visit complete. I will see [name] in two weeks.   Refills- Pantoprazole Wellbutrin Seroquel Spironolactone   Future Appointments  Date Time Provider [place]  10/12/2020 11:30 AM Valente David, RN [facility] None  11/02/2020  1:40 PM [name], MD [facility] None  12/03/2020  7:40 AM [facility] DEVICE REMOTES [facility]  12/08/2020  1:15 PM [name], MD [facility]  01/19/2021  9:00 AM Azzie Glatter, FNP [facility] None  03/04/2021  7:40 AM [facility] DEVICE REMOTES [facility]  03/14/2021  9:00 AM Azzie Glatter, FNP [facility] None  06/03/2021  7:40 AM [facility] DEVICE REMOTES [facility]  09/02/2021  7:40 AM [facility] DEVICE REMOTES [facility]  12/02/2021  7:40 AM [facility] DEVICE REMOTES [facility]  03/03/2022  7:40 AM [facility] DEVICE REMOTES [facility]     ACTION: Home visit completed Next visit planned for two weeks

## 2020-09-29 MED FILL — GABAPENTIN 300 MG CAPSULE: 300 | 30 days supply | Qty: 120 | Fill #3

## 2020-10-04 ENCOUNTER — Telehealth: Payer: Self-pay | Admitting: *Deleted

## 2020-10-04 NOTE — Telephone Encounter (Signed)
   Primary Cardiologist: [name], MD; Loralie Champagne, MD   Chart reviewed as part of pre-operative protocol coverage. Patient was contacted 10/04/2020 in reference to pre-operative risk assessment for pending surgery as outlined below.  [name] was last seen on 08/03/2020 by Dr. Quentin Ore for ICD consideration. He underwent ICD implantation 09/03/20. Since that day, [name] has done well from a cardiac standpoint. He has some DOE, though reports no significant change in recent months. He is able to complete 4 METs without anginal complaints.  Therefore, based on ACC/AHA guidelines, the patient would be at acceptable risk for the planned procedure without further cardiovascular testing.   The patient was advised that if he develops new symptoms prior to surgery to contact our office to arrange for a follow-up visit, and he verbalized understanding.  I will route this recommendation to the requesting party via Epic fax function and remove from pre-op pool. Please call with questions.  Abigail Butts, PA-C 10/04/2020, 10:41 AM

## 2020-10-04 NOTE — Telephone Encounter (Signed)
Lab work and office note fax via [employer]

## 2020-10-04 NOTE — Telephone Encounter (Signed)
° °  [facility] Pre-operative Risk Assessment    HEARTCARE STAFF: - Please ensure there is not already an duplicate clearance open for this procedure. - Under Visit Info/Reason for Call, type in Other and utilize the format Clearance MM/DD/YY or Clearance TBD. Do not use dashes or single digits. - If request is for dental extraction, please clarify the # of teeth to be extracted.  Request for surgical clearance:  1. What type of surgery is being performed?  7 EXTRACTIONS, ALVEOLOPLASTY IN LOWER LEFT, LOWER RIGHT, UPPER LEFT AND UPPER RIGHT    2. When is this surgery scheduled?  TBD   3. What type of clearance is required (medical clearance vs. Pharmacy clearance to hold med vs. Both)?  MEDICAL  4. Are there any medications that need to be held prior to surgery and how long? PT IS ON ELIQUIS BUT I CONFIRMED WITH THE OFFICE THAT THEY DO NOT REQUIRE FOR PT TO HOLD    5. Practice name and name of physician performing surgery?  [facility]  / DR. Buelah Manis   6. What is the office phone number?  [phone]   7.   What is the office fax number?  [phone]  8.   Anesthesia type (None, local, MAC, general) ?  GENERAL   [name] 10/04/2020, 8:06 AM  _________________________________________________________________   (provider comments below)

## 2020-10-04 NOTE — Telephone Encounter (Signed)
[facility] contacted [place] because the office did not get recent lab results or a recent office visit note. The office needs that faxed to them before they can schedule the patient. Please fax note and lab results to [phone]

## 2020-10-05 ENCOUNTER — Other Ambulatory Visit: Payer: Self-pay | Admitting: Family Medicine

## 2020-10-05 ENCOUNTER — Ambulatory Visit: Payer: Medicare Other

## 2020-10-05 ENCOUNTER — Other Ambulatory Visit: Payer: Self-pay

## 2020-10-05 DIAGNOSIS — E1165 Type 2 diabetes mellitus with hyperglycemia: Secondary | ICD-10-CM

## 2020-10-05 DIAGNOSIS — Z794 Long term (current) use of insulin: Secondary | ICD-10-CM

## 2020-10-05 LAB — POCT GLYCOSYLATED HEMOGLOBIN (HGB A1C)
HbA1c POC (<> result, manual entry): 11.7 % (ref 4.0–5.6)
HbA1c, POC (controlled diabetic range): 11.7 % — AB (ref 0.0–7.0)
HbA1c, POC (prediabetic range): 11.7 % — AB (ref 5.7–6.4)
Hemoglobin A1C: 11.7 % — AB (ref 4.0–5.6)

## 2020-10-05 MED ORDER — GLIPIZIDE 10 MG PO TABS: 10.0000 mg | ORAL_TABLET | Freq: Two times a day (BID) | ORAL | 6 refills | Status: DC

## 2020-10-05 MED ORDER — METFORMIN HCL 500 MG PO TABS: 500.0000 mg | ORAL_TABLET | Freq: Two times a day (BID) | ORAL | 6 refills | Status: DC

## 2020-10-05 MED ORDER — INSULIN DETEMIR 100 UNIT/ML ~~LOC~~ SOLN: 20.0000 [IU] | Freq: Every day | SUBCUTANEOUS | 11 refills | Status: DC

## 2020-10-05 MED FILL — METFORMIN HCL 500 MG TABS: 500 | 30 days supply | Qty: 60 | Fill #0

## 2020-10-05 MED FILL — CYCLOBENZAPRINE 10 MG TAB: 10 | 10 days supply | Qty: 30 | Fill #3

## 2020-10-05 MED FILL — PANTOPRAZOLE SOD DR 40 MG T: 40 | 30 days supply | Qty: 60 | Fill #1

## 2020-10-05 MED FILL — BUPROPION HCL SR 100 MG TAB: 100 | 30 days supply | Qty: 60 | Fill #5

## 2020-10-05 MED FILL — glipiZIDE 10 MG TABS: 10 | 30 days supply | Qty: 60 | Fill #0

## 2020-10-05 MED FILL — SPIRONOLACTONE 25 MG TABLET: 25 | 30 days supply | Qty: 30 | Fill #6

## 2020-10-06 ENCOUNTER — Telehealth: Payer: Self-pay | Admitting: Family Medicine

## 2020-10-06 ENCOUNTER — Other Ambulatory Visit: Payer: Self-pay | Admitting: Family Medicine

## 2020-10-06 DIAGNOSIS — E1165 Type 2 diabetes mellitus with hyperglycemia: Secondary | ICD-10-CM

## 2020-10-06 DIAGNOSIS — Z794 Long term (current) use of insulin: Secondary | ICD-10-CM

## 2020-10-06 MED ORDER — INSULIN GLARGINE 100 UNIT/ML ~~LOC~~ SOLN: 20.0000 [IU] | Freq: Every day | SUBCUTANEOUS | 3 refills | Status: DC

## 2020-10-06 MED FILL — LANTUS 100 UNITS/ML VIAL: 100 | 28 days supply | Qty: 30 | Fill #0

## 2020-10-07 ENCOUNTER — Telehealth: Payer: Self-pay

## 2020-10-07 NOTE — Telephone Encounter (Signed)
Sent to provider 

## 2020-10-07 NOTE — Telephone Encounter (Signed)
The pt states he dropped his pill case and do not know what pill goes where. He would like for the nurse to give him a call back.

## 2020-10-08 ENCOUNTER — Other Ambulatory Visit: Payer: Self-pay | Admitting: Cardiology

## 2020-10-08 MED FILL — QUETIAPINE FUMARATE 200 MG: 200 | 30 days supply | Qty: 30 | Fill #2

## 2020-10-11 NOTE — Telephone Encounter (Signed)
Spoke w/[name] [name], pt's community paramedic. She is aware and has been trying to reach pt to f/u, she is schedule to see pt tomorrow but if she can reach him today she can do her visit today.

## 2020-10-12 ENCOUNTER — Other Ambulatory Visit: Payer: Self-pay | Admitting: *Deleted

## 2020-10-12 ENCOUNTER — Emergency Department (HOSPITAL_COMMUNITY)
Admission: EM | Admit: 2020-10-12 | Discharge: 2020-10-12 | Disposition: A | Discharge status: Home / Self Care | Payer: Medicare Other | Attending: Emergency Medicine | Admitting: Emergency Medicine

## 2020-10-12 ENCOUNTER — Emergency Department (HOSPITAL_COMMUNITY): Payer: Medicare Other

## 2020-10-12 ENCOUNTER — Other Ambulatory Visit: Payer: Self-pay

## 2020-10-12 DIAGNOSIS — Z79899 Other long term (current) drug therapy: Secondary | ICD-10-CM | POA: Insufficient documentation

## 2020-10-12 DIAGNOSIS — M549 Dorsalgia, unspecified: Secondary | ICD-10-CM | POA: Diagnosis not present

## 2020-10-12 DIAGNOSIS — E1165 Type 2 diabetes mellitus with hyperglycemia: Secondary | ICD-10-CM | POA: Insufficient documentation

## 2020-10-12 DIAGNOSIS — Z20822 Contact with and (suspected) exposure to covid-19: Secondary | ICD-10-CM | POA: Insufficient documentation

## 2020-10-12 DIAGNOSIS — Z7901 Long term (current) use of anticoagulants: Secondary | ICD-10-CM | POA: Diagnosis not present

## 2020-10-12 DIAGNOSIS — R0602 Shortness of breath: Secondary | ICD-10-CM | POA: Diagnosis not present

## 2020-10-12 DIAGNOSIS — I2510 Atherosclerotic heart disease of native coronary artery without angina pectoris: Secondary | ICD-10-CM | POA: Diagnosis not present

## 2020-10-12 DIAGNOSIS — Z743 Need for continuous supervision: Secondary | ICD-10-CM | POA: Diagnosis not present

## 2020-10-12 DIAGNOSIS — Z794 Long term (current) use of insulin: Secondary | ICD-10-CM | POA: Insufficient documentation

## 2020-10-12 DIAGNOSIS — R0789 Other chest pain: Secondary | ICD-10-CM | POA: Diagnosis not present

## 2020-10-12 DIAGNOSIS — Z955 Presence of coronary angioplasty implant and graft: Secondary | ICD-10-CM | POA: Diagnosis not present

## 2020-10-12 DIAGNOSIS — I130 Hypertensive heart and chronic kidney disease with heart failure and stage 1 through stage 4 chronic kidney disease, or unspecified chronic kidney disease: Secondary | ICD-10-CM | POA: Insufficient documentation

## 2020-10-12 DIAGNOSIS — Z87891 Personal history of nicotine dependence: Secondary | ICD-10-CM | POA: Diagnosis not present

## 2020-10-12 DIAGNOSIS — R079 Chest pain, unspecified: Secondary | ICD-10-CM | POA: Diagnosis not present

## 2020-10-12 DIAGNOSIS — I517 Cardiomegaly: Secondary | ICD-10-CM | POA: Diagnosis not present

## 2020-10-12 DIAGNOSIS — I5023 Acute on chronic systolic (congestive) heart failure: Secondary | ICD-10-CM | POA: Diagnosis not present

## 2020-10-12 DIAGNOSIS — E1142 Type 2 diabetes mellitus with diabetic polyneuropathy: Secondary | ICD-10-CM | POA: Insufficient documentation

## 2020-10-12 DIAGNOSIS — N183 Chronic kidney disease, stage 3 unspecified: Secondary | ICD-10-CM | POA: Diagnosis not present

## 2020-10-12 DIAGNOSIS — R059 Cough, unspecified: Secondary | ICD-10-CM | POA: Insufficient documentation

## 2020-10-12 LAB — COMPREHENSIVE METABOLIC PANEL
ALT: 19 U/L (ref 0–44)
AST: 26 U/L (ref 15–41)
Albumin: 3.3 g/dL — ABNORMAL LOW (ref 3.5–5.0)
Alkaline Phosphatase: 64 U/L (ref 38–126)
Anion gap: 11 (ref 5–15)
BUN: 24 mg/dL — ABNORMAL HIGH (ref 6–20)
CO2: 23 mmol/L (ref 22–32)
Calcium: 9 mg/dL (ref 8.9–10.3)
Chloride: 103 mmol/L (ref 98–111)
Creatinine, Ser: 1.42 mg/dL — ABNORMAL HIGH (ref 0.61–1.24)
GFR, Estimated: 57 mL/min — ABNORMAL LOW (ref >60)
Glucose, Bld: 238 mg/dL — ABNORMAL HIGH (ref 70–99)
Potassium: 4 mmol/L (ref 3.5–5.1)
Sodium: 137 mmol/L (ref 135–145)
Total Bilirubin: 0.5 mg/dL (ref 0.3–1.2)
Total Protein: 7.4 g/dL (ref 6.5–8.1)

## 2020-10-12 LAB — CBC WITH DIFFERENTIAL/PLATELET
Abs Immature Granulocytes: 0.01 10*3/uL (ref 0.00–0.07)
Basophils Absolute: 0 10*3/uL (ref 0.0–0.1)
Basophils Relative: 1 %
Eosinophils Absolute: 0.2 10*3/uL (ref 0.0–0.5)
Eosinophils Relative: 4 %
HCT: 42.6 % (ref 39.0–52.0)
Hemoglobin: 13.1 g/dL (ref 13.0–17.0)
Immature Granulocytes: 0 %
Lymphocytes Relative: 27 %
Lymphs Abs: 1.8 10*3/uL (ref 0.7–4.0)
MCH: 26.4 pg (ref 26.0–34.0)
MCHC: 30.8 g/dL (ref 30.0–36.0)
MCV: 85.7 fL (ref 80.0–100.0)
Monocytes Absolute: 0.6 10*3/uL (ref 0.1–1.0)
Monocytes Relative: 10 %
Neutro Abs: 3.9 10*3/uL (ref 1.7–7.7)
Neutrophils Relative %: 58 %
Platelets: 232 10*3/uL (ref 150–400)
RBC: 4.97 MIL/uL (ref 4.22–5.81)
RDW: 13.9 % (ref 11.5–15.5)
WBC: 6.6 10*3/uL (ref 4.0–10.5)
nRBC: 0 % (ref 0.0–0.2)

## 2020-10-12 LAB — RESPIRATORY PANEL BY RT PCR (FLU A&B, COVID)
Influenza A by PCR: NEGATIVE
Influenza B by PCR: NEGATIVE
SARS Coronavirus 2 by RT PCR: NEGATIVE

## 2020-10-12 LAB — TROPONIN I (HIGH SENSITIVITY)
Troponin I (High Sensitivity): 23 ng/L — ABNORMAL HIGH (ref <18)
Troponin I (High Sensitivity): 18 ng/L — ABNORMAL HIGH (ref <18)

## 2020-10-12 MED ORDER — FENTANYL CITRATE (PF) 100 MCG/2ML IJ SOLN
50.0000 ug | Freq: Once | INTRAMUSCULAR | Status: AC
  Administered 2020-10-12: 50 ug via INTRAVENOUS
  Filled 2020-10-12: qty 2

## 2020-10-12 MED ORDER — IOHEXOL 350 MG/ML SOLN
80.0000 mL | Freq: Once | INTRAVENOUS | Status: AC | PRN
  Administered 2020-10-12: 80 mL via INTRAVENOUS

## 2020-10-12 NOTE — ED Notes (Signed)
EKG obtained at 0453 due to having difficulty with equipment.

## 2020-10-12 NOTE — ED Notes (Signed)
Reviewed discharge instructions with patient. Follow-up care reviewed. Patient verbalized understanding. Patient A&Ox4, VSS, and ambulatory with steady gait upon discharge.  

## 2020-10-12 NOTE — Patient Outreach (Signed)
[facility]) Care Management  10/12/2020  ALTONIO SCHWERTNER [birth year] [id number]   Member was scheduled for outreach today, however noted that he is currently in the ED with chest pain.  Will follow up within the next week pending disposition.  Valente David, [place], MSN [place] [phone]

## 2020-10-12 NOTE — ED Notes (Signed)
Notified PA of pt having intermittent CP rated 6/10 at this time.

## 2020-10-12 NOTE — ED Notes (Signed)
Pt transported to CT ?

## 2020-10-12 NOTE — ED Provider Notes (Signed)
[facility] [place] [facility] EMERGENCY DEPARTMENT Provider Note   CSN: [id number] Arrival date & time: 10/12/20  0330     History Chief Complaint  Patient presents with  . Chest Pain    [name] is a 59 y.o. male.  Patient with history of CAD s/p PCI, HTN, CHF, DM, schizophrenia, sleep apnea, recent pacer/defibrillator placement (09/03/20) to ED for evaluation of chest pain that started 2 days ago. He reports he was out for a walk and had sudden onset bilateral chest pain that radiates to the back, SOB, diaphoresis. Pain improved with rest but has not completely resolved since onset. No nausea, vomiting. He has been taking Tylenol and ibuprofen without relief. He does not have nitroglycerin at home but was given 2 NTG by EMS which he says reduced his pain from 8/10 to 6/10. He reports cough without fever.   The history is provided by the patient. No language interpreter was used.  Chest Pain Associated symptoms: cough, diaphoresis and shortness of breath   Associated symptoms: no abdominal pain, no fever, no nausea, no vomiting and no weakness        Past Medical History:  Diagnosis Date  . Anxiety   . CAD (coronary artery disease)   . CHF (congestive heart failure) ([place]) 09/2019  . Depression   . Diabetes mellitus   . Erectile dysfunction 11/2019  . H/O right heart catheterization 09/2019  . Hypertension   . Schizophrenia ([place])   . Sleep apnea    uses cpap    Patient Active Problem List   Diagnosis Date Noted  . Acute blood loss anemia   . Angiodysplasia of stomach   . Chronic anticoagulation   . CHF exacerbation ([place]) 06/15/2020  . AKI (acute kidney injury) ([place]) 06/15/2020  . CKD (chronic kidney disease), stage III ([place]) 06/15/2020  . Anemia due to chronic blood loss   . Gastric AVM   . Angiodysplasia of small intestine ([place])   . Acute on chronic systolic (congestive) heart failure ([place]) 04/05/2020  . Anemia 04/05/2020  . PAF (paroxysmal atrial  fibrillation) ([place]) 04/05/2020  . OSA on CPAP 04/05/2020  . Syncope and collapse 04/05/2020  . Type 2 diabetes mellitus with hyperglycemia, with long-term current use of insulin ([place]) 12/03/2019  . Diabetic polyneuropathy associated with type 2 diabetes mellitus ([place]) 12/03/2019  . Hyperglycemia 12/03/2019  . History of hyperglycemia 12/03/2019  . Erectile dysfunction 12/03/2019  . CHF (congestive heart failure) ([place]) 10/11/2019  . Paranoid schizophrenia, chronic condition ([place]) 01/26/2015  . Severe recurrent major depressive disorder with psychotic features ([place]) 01/26/2015  . GAD (generalized anxiety disorder) 01/26/2015  . OCD (obsessive compulsive disorder) 01/26/2015  . Panic disorder without agoraphobia 01/26/2015  . Insomnia 01/26/2015    Past Surgical History:  Procedure Laterality Date  . CORONARY STENT PLACEMENT    . ENTEROSCOPY N/A 04/08/2020   Procedure: ENTEROSCOPY;  Surgeon: Doran Stabler, MD;  Location[facility] ENDOSCOPY;  Service: Gastroenterology;  Laterality: N/A;  . ENTEROSCOPY N/A 06/17/2020   Procedure: ENTEROSCOPY;  Surgeon: Irene Shipper, MD;  Location: [facility] ENDOSCOPY;  Service: Endoscopy;  Laterality: N/A;  . HEMOSTASIS CLIP PLACEMENT  04/08/2020   Procedure: HEMOSTASIS CLIP PLACEMENT;  Surgeon: Doran Stabler, MD;  Location: [facility];  Service: Gastroenterology;;  . HEMOSTASIS CONTROL  04/08/2020   Procedure: HEMOSTASIS CONTROL;  Surgeon: Doran Stabler, MD;  Location: [facility];  Service: Gastroenterology;;  . HEMOSTASIS CONTROL  06/17/2020   Procedure: HEMOSTASIS CONTROL;  Surgeon: Irene Shipper, MD;  Location: [facility] ENDOSCOPY;  Service: Endoscopy;;  . HERNIA REPAIR    . ICD IMPLANT N/A 09/03/2020   Procedure: ICD IMPLANT;  Surgeon: [name], MD;  Location: [facility] CV LAB;  Service: Cardiovascular;  Laterality: N/A;  . RIGHT/LEFT HEART CATH AND CORONARY ANGIOGRAPHY N/A 10/14/2019   Procedure: RIGHT/LEFT HEART CATH AND CORONARY  ANGIOGRAPHY;  Surgeon: Belva Crome, MD;  Location: [place] CV LAB;  Service: Cardiovascular;  Laterality: N/A;       Family History  Problem Relation Age of Onset  . Heart failure Mother   . Mental illness Sister   . Mental illness Sister     Social History   Tobacco Use  . 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 Smokeless tobacco: Never Used  Vaping Use  . Vaping Use: Never used  Substance Use Topics  . Alcohol use: Yes    Alcohol/week: 2.0 standard drinks    Types: 2 Cans of beer per week  . Drug use: No    Home Medications Prior to Admission medications   Medication Sig Start Date End Date Taking? Authorizing Provider  acetaminophen (TYLENOL) 500 MG tablet Take 2 tablets (1,000 mg total) by mouth in the morning and at bedtime. 09/13/20   Azzie Glatter, FNP  apixaban (ELIQUIS) 5 MG TABS tablet Take 1 tablet (5 mg total) by mouth 2 (two) times daily. 04/12/20   Lyda Jester M, PA-C  atorvastatin (LIPITOR) 40 MG tablet Take 1 tablet (40 mg total) by mouth daily at 6 PM. 03/15/20 04/16/21  [name], MD  blood glucose meter kit and supplies KIT Dispense based on patient and insurance preference. Use up to four times daily as directed. (FOR ICD-9 250.00, 250.01). 10/16/19   Barb Merino, MD  buPROPion (WELLBUTRIN SR) 100 MG 12 hr tablet Take 1 tablet (100 mg total) by mouth 2 (two) times daily. 04/16/20   Azzie Glatter, FNP  carvedilol (COREG) 12.5 MG tablet Take 1.5 tablets (18.75 mg total) by mouth 2 (two) times daily with a meal. 07/28/20   [name], MD  cyclobenzaprine (FLEXERIL) 10 MG tablet Take 10 mg by mouth daily as needed for muscle spasms.     [provider]  ferrous sulfate 325 (65 FE) MG tablet Take 1 tablet (325 mg total) by mouth daily with breakfast. Patient taking differently: Take 325 mg by mouth 2 (two) times daily with a meal.  04/09/20   Lyda Jester M, PA-C  furosemide (LASIX) 40 MG tablet Take 2 tablets (80 mg total) by  mouth 2 (two) times daily. 07/20/20   [name], MD  gabapentin (NEURONTIN) 300 MG capsule Take 2 capsules (total = 600 mg), by mouth, 3 times a day. 09/13/20   Azzie Glatter, FNP  glipiZIDE (GLUCOTROL) 10 MG tablet Take 1 tablet (10 mg total) by mouth 2 (two) times daily before a meal. 10/05/20   Azzie Glatter, FNP  insulin glargine (LANTUS) 100 UNIT/ML injection Inject 0.2 mLs (20 Units total) into the skin daily. 10/06/20   Azzie Glatter, FNP  Insulin Lispro Prot & Lispro (HUMALOG MIX 75/25 KWIKPEN) (75-25) 100 UNIT/ML Kwikpen Inject 30 Units into the skin 2 (two) times daily. 02/18/20   Azzie Glatter, FNP  metFORMIN (GLUCOPHAGE) 500 MG tablet Take 1 tablet (500 mg total) by mouth 2 (two) times daily with a meal. 10/05/20   Azzie Glatter, FNP  Multiple Vitamins-Minerals (CENTRUM SILVER 50+MEN)  TABS Take 1 tablet by mouth daily.    [provider]  pantoprazole (PROTONIX) 40 MG tablet TAKE 1 TABLET (40 MG TOTAL) BY MOUTH 2 (TWO) TIMES DAILY. 10/08/20 10/08/21  [name], MD  potassium chloride SA (KLOR-CON M20) 20 MEQ tablet Take 1 tablet (20 mEq total) by mouth daily. 06/10/20 09/28/20  [name], MD  QUEtiapine (SEROQUEL) 200 MG tablet Take 1 tablet (200 mg total) by mouth at bedtime. 08/10/20   Azzie Glatter, FNP  sacubitril-valsartan (ENTRESTO) 97-103 MG Take 1 tablet by mouth 2 (two) times daily. 06/29/20   [name], MD  sildenafil (VIAGRA) 100 MG tablet Take 1 tablet (100 mg total) by mouth daily as needed for erectile dysfunction. 08/10/20   Azzie Glatter, FNP  spironolactone (ALDACTONE) 25 MG tablet Take 1 tablet (25 mg total) by mouth daily. 03/15/20   [name], MD  tetrahydrozoline 0.05 % ophthalmic solution Place 1 drop into both eyes daily.    [provider]  TRUEPLUS PEN NEEDLES 31G X 6 MM MISC USE AS DIRECTED 04/14/20   Azzie Glatter, FNP    Allergies    Patient has no known allergies.  Review of Systems     Review of Systems  Constitutional: Positive for diaphoresis. Negative for chills and fever.  HENT: Negative.  Negative for congestion.   Respiratory: Positive for cough and shortness of breath.   Cardiovascular: Positive for chest pain. Negative for leg swelling.  Gastrointestinal: Negative.  Negative for abdominal pain, nausea and vomiting.  Musculoskeletal: Negative.  Negative for myalgias.  Skin: Negative.   Neurological: Negative.  Negative for weakness.    Physical Exam Updated Vital Signs BP 140/73   Pulse 62   Temp 98.5 F (36.9 C)   Resp 19   Ht _0  (1.88 m)   Wt (!) 141.5 kg   SpO2 92%   BMI 40.06 kg/m   Physical Exam Vitals and nursing note reviewed.  Constitutional:      General: He is not in acute distress.    Appearance: He is well-developed. He is obese.  HENT:     Head: Normocephalic.  Cardiovascular:     Rate and Rhythm: Normal rate and regular rhythm.     Heart sounds: No murmur heard.   Pulmonary:     Effort: Pulmonary effort is normal.     Breath sounds: Normal breath sounds. No wheezing, rhonchi or rales.  Chest:     Chest wall: No tenderness.  Abdominal:     General: Bowel sounds are normal.     Palpations: Abdomen is soft.     Tenderness: There is no abdominal tenderness. There is no guarding or rebound.  Musculoskeletal:        General: Normal range of motion.     Cervical back: Normal range of motion and neck supple.     Left lower leg: No edema.  Skin:    General: Skin is warm and dry.     Findings: No rash.  Neurological:     Mental Status: He is alert and oriented to person, place, and time.     ED Results / Procedures / Treatments   Labs (all labs ordered are listed, but only abnormal results are displayed) Labs Reviewed  COMPREHENSIVE METABOLIC PANEL - Abnormal; Notable for the following components:      Result Value   Glucose, Bld 238 (*)    BUN 24 (*)    Creatinine, Ser 1.42 (*)  Albumin 3.3 (*)    GFR, Estimated  57 (*)    All other components within normal limits  TROPONIN I (HIGH SENSITIVITY) - Abnormal; Notable for the following components:   Troponin I (High Sensitivity) 23 (*)    All other components within normal limits  CBC WITH DIFFERENTIAL/PLATELET  TROPONIN I (HIGH SENSITIVITY)   Results for orders placed or performed during the hospital encounter of 10/12/20  CBC with Differential  Result Value Ref Range   WBC 6.6 4.0 - 10.5 K/uL   RBC 4.97 4.22 - 5.81 MIL/uL   Hemoglobin 13.1 13.0 - 17.0 g/dL   HCT 42.6 39 - 52 %   MCV 85.7 80.0 - 100.0 fL   MCH 26.4 26.0 - 34.0 pg   MCHC 30.8 30.0 - 36.0 g/dL   RDW 13.9 11.5 - 15.5 %   Platelets 232 150 - 400 K/uL   nRBC 0.0 0.0 - 0.2 %   Neutrophils Relative % 58 %   Neutro Abs 3.9 1.7 - 7.7 K/uL   Lymphocytes Relative 27 %   Lymphs Abs 1.8 0.7 - 4.0 K/uL   Monocytes Relative 10 %   Monocytes Absolute 0.6 0.1 - 1.0 K/uL   Eosinophils Relative 4 %   Eosinophils Absolute 0.2 0.0 - 0.5 K/uL   Basophils Relative 1 %   Basophils Absolute 0.0 0.0 - 0.1 K/uL   Immature Granulocytes 0 %   Abs Immature Granulocytes 0.01 0.00 - 0.07 K/uL  Comprehensive metabolic panel  Result Value Ref Range   Sodium 137 135 - 145 mmol/L   Potassium 4.0 3.5 - 5.1 mmol/L   Chloride 103 98 - 111 mmol/L   CO2 23 22 - 32 mmol/L   Glucose, Bld 238 (H) 70 - 99 mg/dL   BUN 24 (H) 6 - 20 mg/dL   Creatinine, Ser 1.42 (H) 0.61 - 1.24 mg/dL   Calcium 9.0 8.9 - 10.3 mg/dL   Total Protein 7.4 6.5 - 8.1 g/dL   Albumin 3.3 (L) 3.5 - 5.0 g/dL   AST 26 15 - 41 U/L   ALT 19 0 - 44 U/L   Alkaline Phosphatase 64 38 - 126 U/L   Total Bilirubin 0.5 0.3 - 1.2 mg/dL   GFR, Estimated 57 (L) >60 mL/min   Anion gap 11 5 - 15  Troponin I (High Sensitivity)  Result Value Ref Range   Troponin I (High Sensitivity) 23 (H) <18 ng/L     EKG EKG Interpretation  Date/Time:  Tuesday October 12 2020 04:53:05 EST Ventricular Rate:  56 PR Interval:    QRS Duration: 102 QT  Interval:  509 QTC Calculation: 492 R Axis:   17 Text Interpretation: Sinus rhythm Prolonged PR interval Borderline prolonged QT interval No significant change since last tracing Confirmed by Orpah Greek [phone]) on 10/12/2020 6:01:52 AM   Radiology DG Chest Portable 1 View  Result Date: 10/12/2020 CLINICAL DATA:  Chest pain EXAM: PORTABLE CHEST 1 VIEW COMPARISON:  09/03/2020 FINDINGS: Chronic cardiomegaly. Single chamber defibrillator lead into the right ventricle. There is no edema, consolidation, effusion, or pneumothorax. Artifact from EKG leads IMPRESSION: 1. No acute finding. 2. Cardiomegaly. Electronically Signed   By: Monte Fantasia M.D.   On: 10/12/2020 04:25    Procedures Procedures (including critical care time)  Medications Ordered in ED Medications  fentaNYL (SUBLIMAZE) injection 50 mcg (50 mcg Intravenous Given 10/12/20 0555)    ED Course  I have reviewed the triage vital signs and the nursing notes.  Pertinent labs & imaging results that were available during my care of the patient were reviewed by me and considered in my medical decision making (see chart for details).    MDM Rules/Calculators/A&P                          Patient to ED with chest pain as described in the HPI.  History of CAD with previous stents, recent AICD placed, with symptoms of onset chest pain during exertion, associated with SOB, diaphoresis, however, constant for the past 2 days, waxing and waning. Initial troponin slightly elevated at 23. EKG without change from previous, no ischemia. CXR unremarkable.   He will need a delta troponin, re-evaluation of the patient and consult to cardiology to determine disposition. If no up trending enzymes, consider possibility of discharge home with close in-office follow up. Patient care signed out to [facility], PA-C, pending second troponin and call to cardiology.   Final Clinical Impression(s) / ED Diagnoses Final diagnoses:  None    1. Nonspecific chest pain  Rx / DC Orders ED Discharge Orders    None       Charlann Lange, PA-C 10/12/20 0300    Orpah Greek, MD 10/12/20 [phone]

## 2020-10-12 NOTE — ED Triage Notes (Signed)
BIB in by [facility] after pt called to report 8/10 C/P that began on Sunday night and has increased within the last 24 hours. Per EMS pt c/o of chest pressure with radiation to back. Pt has hx of MI, Defibrillator, and back spams. PT given 324 aspirin, and SL nitro x 2 enroute. C/P now 6/10.   Temp 98.5 HR 60 02 97% RA RR 22 104/62

## 2020-10-12 NOTE — Discharge Instructions (Signed)
Your work-up today was reassuring.  Please follow-up with your cardiologist.  Return to the Emergency Department immediately if you experiencing worsening chest pain, difficulty breathing, nausea/vomiting, get very sweaty, headache or any other worsening or concerning symptoms.

## 2020-10-12 NOTE — ED Provider Notes (Signed)
Care assumed from [place], PA-C at shift change with delta troponin and re-evaluation pending.   In brief, this patient is a 59 y.o. M with PMH/o CAD, s/p PCI, HTN, CHF, DM with recent pacer/defibrillator placement (09/03/20) who presents for evaluation of chest pain that started 2 days ago.  He states this started while he was walking and he experienced bilateral chest pain that radiates back.  He reported associated shortness of breath, diaphoresis.  He states that at times, the chest pain has eased off but is never completely gone away.  He has been taking Tylenol and ibuprofen without relief.  On EMS arrival, he was given 2 nitro which he states brought his pain down to 6/10.  No fevers.    Physical Exam  BP 140/73   Pulse 62   Temp 98.5 F (36.9 C)   Resp 19   Ht 6\' 2"  (1.88 m)   Wt (!) 141.5 kg   SpO2 92%   BMI 40.06 kg/m   Physical Exam  2+ equal pulses on BUE and BLE  ED Course/Procedures     Procedures   Results for orders placed or performed during the hospital encounter of 10/12/20 (from the past 24 hour(s))  CBC with Differential     Status: None   Collection Time: 10/12/20  4:22 AM  Result Value Ref Range   WBC 6.6 4.0 - 10.5 K/uL   RBC 4.97 4.22 - 5.81 MIL/uL   Hemoglobin 13.1 13.0 - 17.0 g/dL   HCT 42.6 39 - 52 %   MCV 85.7 80.0 - 100.0 fL   MCH 26.4 26.0 - 34.0 pg   MCHC 30.8 30.0 - 36.0 g/dL   RDW 13.9 11.5 - 15.5 %   Platelets 232 150 - 400 K/uL   nRBC 0.0 0.0 - 0.2 %   Neutrophils Relative % 58 %   Neutro Abs 3.9 1.7 - 7.7 K/uL   Lymphocytes Relative 27 %   Lymphs Abs 1.8 0.7 - 4.0 K/uL   Monocytes Relative 10 %   Monocytes Absolute 0.6 0.1 - 1.0 K/uL   Eosinophils Relative 4 %   Eosinophils Absolute 0.2 0.0 - 0.5 K/uL   Basophils Relative 1 %   Basophils Absolute 0.0 0.0 - 0.1 K/uL   Immature Granulocytes 0 %   Abs Immature Granulocytes 0.01 0.00 - 0.07 K/uL  Comprehensive metabolic panel     Status: Abnormal   Collection Time: 10/12/20   4:22 AM  Result Value Ref Range   Sodium 137 135 - 145 mmol/L   Potassium 4.0 3.5 - 5.1 mmol/L   Chloride 103 98 - 111 mmol/L   CO2 23 22 - 32 mmol/L   Glucose, Bld 238 (H) 70 - 99 mg/dL   BUN 24 (H) 6 - 20 mg/dL   Creatinine, Ser 1.42 (H) 0.61 - 1.24 mg/dL   Calcium 9.0 8.9 - 10.3 mg/dL   Total Protein 7.4 6.5 - 8.1 g/dL   Albumin 3.3 (L) 3.5 - 5.0 g/dL   AST 26 15 - 41 U/L   ALT 19 0 - 44 U/L   Alkaline Phosphatase 64 38 - 126 U/L   Total Bilirubin 0.5 0.3 - 1.2 mg/dL   GFR, Estimated 57 (L) >60 mL/min   Anion gap 11 5 - 15  Troponin I (High Sensitivity)     Status: Abnormal   Collection Time: 10/12/20  4:22 AM  Result Value Ref Range   Troponin I (High Sensitivity) 23 (H) <18 ng/L  Troponin I (High Sensitivity)     Status: Abnormal   Collection Time: 10/12/20  6:04 AM  Result Value Ref Range   Troponin I (High Sensitivity) 18 (H) <18 ng/L   EKG Interpretation  Date/Time:  Tuesday October 12 2020 04:53:05 EST Ventricular Rate:  56 PR Interval:    QRS Duration: 102 QT Interval:  509 QTC Calculation: 492 R Axis:   17 Text Interpretation: Sinus rhythm Prolonged PR interval Borderline prolonged QT interval No significant change since last tracing Confirmed by Orpah Greek [phone]) on 10/12/2020 6:01:52 AM  EKG Interpretation  Date/Time:  Tuesday October 12 2020 12:55:55 EST Ventricular Rate:  68 PR Interval:    QRS Duration: 103 QT Interval:  447 QTC Calculation: 476 R Axis:   23 Text Interpretation: Sinus rhythm Atrial premature complex Borderline prolonged QT interval No significant change since last tracing Confirmed by Theotis Burrow [phone]) on 10/12/2020 12:59:12 PM    MDM    PLAN: Patient pending a second troponin. If unchanged, consult cards.   MDM: His initial department was 23.  CMP shows BUN of 24, creatinine 1.42.  CBC shows no significant leukocytosis or anemia.  Delta troponin is 18.  Chest x-ray shows no acute finding.  There is  evidence of cardiomegaly.  Discussed patient with Dr. Johnsie Cancel (Cardiology).  Given reassuring EKG as well as 48 hours of chest pain and reassuring troponins, he feels it is reasonable to for patient to have outpatient cardiology follow-up.  He does recommend obtaining a CTA of his chest to ensure that this is not a PE that is causing his symptoms.  CTA of chest shows no evidence of PE.  Otherwise unremarkable.  Repeat EKG shows no changes.  Discussed results with patient.  At this time, his hand and it was stable.  I discussed with patient follow-up with his cardiologist on an outpatient basis. At this time, patient exhibits no emergent life-threatening condition that require further evaluation in ED. Patient had ample opportunity for questions and discussion. All patient's questions were answered with full understanding. Strict return precautions discussed. Patient expresses understanding and agreement to plan.   1. Nonspecific chest pain    Portions of this note were generated with Dragon dictation software. Dictation errors may occur despite best attempts at proofreading.     Volanda Napoleon, PA-C 10/12/20 1517    Little, Wenda Overland, MD 10/15/20 [phone]

## 2020-10-13 ENCOUNTER — Other Ambulatory Visit (HOSPITAL_COMMUNITY): Payer: Self-pay

## 2020-10-13 NOTE — Progress Notes (Signed)
Paramedicine Encounter    Patient ID: [name], male    DOB: [birth year], 60 y.o.   MRN: [id number]   Patient Care Team: Azzie Glatter, FNP as PCP - General (Family Medicine) [name], MD as PCP - Cardiology (Cardiology) [name], MD as PCP - Advanced Heart Failure (Cardiology) [name], MD as PCP - Electrophysiology (Cardiology) Valente David, RN as [place] Management  Patient Active Problem List   Diagnosis Date Noted  . Acute blood loss anemia   . Angiodysplasia of stomach   . Chronic anticoagulation   . CHF exacerbation ([place]) 06/15/2020  . AKI (acute kidney injury) ([place]) 06/15/2020  . CKD (chronic kidney disease), stage III ([place]) 06/15/2020  . Anemia due to chronic blood loss   . Gastric AVM   . Angiodysplasia of small intestine ([place])   . Acute on chronic systolic (congestive) heart failure ([place]) 04/05/2020  . Anemia 04/05/2020  . PAF (paroxysmal atrial fibrillation) ([place]) 04/05/2020  . OSA on CPAP 04/05/2020  . Syncope and collapse 04/05/2020  . Type 2 diabetes mellitus with hyperglycemia, with long-term current use of insulin ([place]) 12/03/2019  . Diabetic polyneuropathy associated with type 2 diabetes mellitus ([place]) 12/03/2019  . Hyperglycemia 12/03/2019  . History of hyperglycemia 12/03/2019  . Erectile dysfunction 12/03/2019  . CHF (congestive heart failure) ([place]) 10/11/2019  . Paranoid schizophrenia, chronic condition ([place]) 01/26/2015  . Severe recurrent major depressive disorder with psychotic features ([place]) 01/26/2015  . GAD (generalized anxiety disorder) 01/26/2015  . OCD (obsessive compulsive disorder) 01/26/2015  . Panic disorder without agoraphobia 01/26/2015  . Insomnia 01/26/2015    Current Outpatient Medications:  .  acetaminophen (TYLENOL) 500 MG tablet, Take 2 tablets (1,000 mg total) by mouth in the morning and at bedtime., Disp: 180 tablet, Rfl: 3 .  apixaban (ELIQUIS) 5 MG TABS tablet, Take 1  tablet (5 mg total) by mouth 2 (two) times daily., Disp: 180 tablet, Rfl: 3 .  atorvastatin (LIPITOR) 40 MG tablet, Take 1 tablet (40 mg total) by mouth daily at 6 PM., Disp: 90 tablet, Rfl: 3 .  blood glucose meter kit and supplies KIT, Dispense based on patient and insurance preference. Use up to four times daily as directed. (FOR ICD-9 250.00, 250.01)., Disp: 1 each, Rfl: 0 .  buPROPion (WELLBUTRIN SR) 100 MG 12 hr tablet, Take 1 tablet (100 mg total) by mouth 2 (two) times daily., Disp: 60 tablet, Rfl: 6 .  carvedilol (COREG) 12.5 MG tablet, Take 1.5 tablets (18.75 mg total) by mouth 2 (two) times daily with a meal., Disp: 90 tablet, Rfl: 6 .  cyclobenzaprine (FLEXERIL) 10 MG tablet, Take 10 mg by mouth 3 (three) times daily as needed for muscle spasms. , Disp: , Rfl:  .  ferrous sulfate 325 (65 FE) MG tablet, Take 1 tablet (325 mg total) by mouth daily with breakfast. (Patient taking differently: Take 325 mg by mouth 2 (two) times daily with a meal. ), Disp: 30 tablet, Rfl: 3 .  furosemide (LASIX) 40 MG tablet, Take 2 tablets (80 mg total) by mouth 2 (two) times daily., Disp: 360 tablet, Rfl: 3 .  gabapentin (NEURONTIN) 300 MG capsule, Take 2 capsules (total = 600 mg), by mouth, 3 times a day. (Patient taking differently: Take 600 mg by mouth 2 (two) times daily. ), Disp: 180 capsule, Rfl: 11 .  glipiZIDE (GLUCOTROL) 10 MG tablet, Take 1 tablet (10 mg total) by mouth 2 (two) times daily before a  meal., Disp: 60 tablet, Rfl: 6 .  insulin glargine (LANTUS) 100 UNIT/ML injection, Inject 0.2 mLs (20 Units total) into the skin daily., Disp: 30 mL, Rfl: 3 .  Insulin Lispro Prot & Lispro (HUMALOG MIX 75/25 KWIKPEN) (75-25) 100 UNIT/ML Kwikpen, Inject 30 Units into the skin 2 (two) times daily., Disp: 15 mL, Rfl: 11 .  metFORMIN (GLUCOPHAGE) 500 MG tablet, Take 1 tablet (500 mg total) by mouth 2 (two) times daily with a meal., Disp: 60 tablet, Rfl: 6 .  Multiple Vitamins-Minerals (CENTRUM SILVER 50+MEN)  TABS, Take 1 tablet by mouth daily., Disp: , Rfl:  .  pantoprazole (PROTONIX) 40 MG tablet, TAKE 1 TABLET (40 MG TOTAL) BY MOUTH 2 (TWO) TIMES DAILY., Disp: 60 tablet, Rfl: 1 .  potassium chloride SA (KLOR-CON M20) 20 MEQ tablet, Take 1 tablet (20 mEq total) by mouth daily., Disp: 30 tablet, Rfl: 0 .  QUEtiapine (SEROQUEL) 200 MG tablet, Take 1 tablet (200 mg total) by mouth at bedtime., Disp: 30 tablet, Rfl: 6 .  sacubitril-valsartan (ENTRESTO) 97-103 MG, Take 1 tablet by mouth 2 (two) times daily., Disp: 60 tablet, Rfl: 5 .  sildenafil (VIAGRA) 100 MG tablet, Take 1 tablet (100 mg total) by mouth daily as needed for erectile dysfunction., Disp: 30 tablet, Rfl: 2 .  spironolactone (ALDACTONE) 25 MG tablet, Take 1 tablet (25 mg total) by mouth daily., Disp: 30 tablet, Rfl: 11 .  tetrahydrozoline 0.05 % ophthalmic solution, Place 1 drop into both eyes daily., Disp: , Rfl:  .  TRUEPLUS PEN NEEDLES 31G X 6 MM MISC, USE AS DIRECTED, Disp: 100 each, Rfl: 0 No Known Allergies   Social History   Socioeconomic History  . Marital status: Legally Separated    Spouse name: Not on file  . Number of children: Not on file  . Years of education: Not on file  . Highest education level: Not on file  Occupational History  . Not on file  Tobacco Use  . 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 Smokeless tobacco: Never Used  Vaping Use  . Vaping Use: Never used  Substance and Sexual Activity  . Alcohol use: Yes    Alcohol/week: 2.0 standard drinks    Types: 2 Cans of beer per week  . Drug use: No  . Sexual activity: Yes  Other Topics Concern  . Not on file  Social History Narrative  . Not on file   Social Determinants of Health   Financial Resource Strain: Low Risk   . Difficulty of Paying Living Expenses: Not very hard  Food Insecurity: No Food Insecurity  . Worried About Charity fundraiser in the Last Year: Never true  . Ran Out of Food in the Last Year: Never true  Transportation Needs: Unmet  Transportation Needs  . Lack of Transportation (Medical): Yes  . Lack of Transportation (Non-Medical): Yes  Physical Activity:   . Days of Exercise per Week: Not on file  . Minutes of Exercise per Session: Not on file  Stress:   . Feeling of Stress : Not on file  Social Connections:   . Frequency of Communication with Friends and Family: Not on file  . Frequency of Social Gatherings with Friends and Family: Not on file  . Attends Religious Services: Not on file  . Active Member of Clubs or Organizations: Not on file  . Attends Archivist Meetings: Not on file  . Marital Status: Not on file  Intimate Partner Violence:   . Fear  of Current or Ex-Partner: Not on file  . Emotionally Abused: Not on file  . Physically Abused: Not on file  . Sexually Abused: Not on file    Physical Exam Vitals reviewed.  Constitutional:      Appearance: Normal appearance.  HENT:     Head: Normocephalic.     Nose: Nose normal.     Mouth/Throat:     Mouth: Mucous membranes are moist.     Pharynx: Oropharynx is clear.  Eyes:     Pupils: Pupils are equal, round, and reactive to light.  Cardiovascular:     Rate and Rhythm: Normal rate and regular rhythm.     Pulses: Normal pulses.     Heart sounds: Normal heart sounds.  Pulmonary:     Effort: Pulmonary effort is normal.     Breath sounds: Normal breath sounds.  Abdominal:     General: Abdomen is flat.     Palpations: Abdomen is soft.  Musculoskeletal:        General: Normal range of motion.     Cervical back: Normal range of motion.     Right lower leg: No edema.     Left lower leg: No edema.  Skin:    General: Skin is warm and dry.     Capillary Refill: Capillary refill takes less than 2 seconds.  Neurological:     General: No focal deficit present.     Mental Status: He is alert. Mental status is at baseline.  Psychiatric:        Mood and Affect: Mood normal.    Arrived for home visit for [facility] who was alert and oriented  seated at the kitchen table. [name] reports he had a ER visit over the last few days due to onset of chest pain. No findings noted on ER visit. [name] reports he was compliant with medications however I noted multiple pills still in second pill box which should be empty upon my arrival. Vitals were obtained. Medications were reviewed and confirmed. Pill box filled accordingly. [name] was placed on Metformin, Glipizide and a new insulin by his PCP. [name] says he has been responding well to it and his sugars have been less than 300. I instructed [name] to check his sugars regularly. He agreed. [name] was given updates on upcoming appointments and to call me if he had any symptoms or any questions. Home visit complete. I will see [name] in one week.   CBG- 215   Refills:  Lasix  Potassium       Future Appointments  Date Time Provider [place]  10/19/2020  2:30 PM Valente David, RN [facility] None  11/02/2020  1:40 PM [name], MD [facility] None  12/03/2020  7:40 AM [facility] DEVICE REMOTES [facility]  12/08/2020  1:15 PM [name], MD [facility]  01/19/2021  9:00 AM Azzie Glatter, FNP [facility] None  03/04/2021  7:40 AM [facility] DEVICE REMOTES [facility]  03/14/2021  9:00 AM Azzie Glatter, FNP [facility] None  06/03/2021  7:40 AM [facility] DEVICE REMOTES [facility]  09/02/2021  7:40 AM [facility] DEVICE REMOTES [facility]  12/02/2021  7:40 AM [facility] DEVICE REMOTES [facility]  03/03/2022  7:40 AM [facility] DEVICE REMOTES [facility]     ACTION: Home visit completed Next visit planned for one week

## 2020-10-14 MED FILL — FUROSEMIDE 40 MG TAB: 40 | 30 days supply | Qty: 120 | Fill #3

## 2020-10-14 MED FILL — POTASSIUM CL ER 20 MEQ TAB: 20 | 30 days supply | Qty: 60 | Fill #2

## 2020-10-19 ENCOUNTER — Telehealth (HOSPITAL_COMMUNITY): Payer: Self-pay | Admitting: *Deleted

## 2020-10-19 ENCOUNTER — Other Ambulatory Visit (HOSPITAL_COMMUNITY): Payer: Self-pay

## 2020-10-19 ENCOUNTER — Other Ambulatory Visit: Payer: Self-pay | Admitting: *Deleted

## 2020-10-19 NOTE — Patient Outreach (Signed)
[facility]) Care Management  10/19/2020  WARD BOISSONNEAULT [birth year] [id number]   Call placed to member, no answer, HIPAA compliant voice message left.  Will follow up within the next 3-4 business days.  Valente David, [place], MSN [place] [phone]

## 2020-10-19 NOTE — Telephone Encounter (Signed)
Received fax from the [facility], pt needs oral surgery under general anesthesia  Per Dr Aundra Dubin: treatment may proceed  Form faxed back to them at [phone]

## 2020-10-19 NOTE — Progress Notes (Signed)
Paramedicine Encounter    Patient ID: [name], male    DOB: December 31, 1960, 59 y.o.   MRN: [id number]   Patient Care Team: Azzie Glatter, FNP as PCP - General (Family Medicine) [name], MD as PCP - Cardiology (Cardiology) [name], MD as PCP - Advanced Heart Failure (Cardiology) [name], MD as PCP - Electrophysiology (Cardiology) Valente David, RN as [place] Management  Patient Active Problem List   Diagnosis Date Noted  . Acute blood loss anemia   . Angiodysplasia of stomach   . Chronic anticoagulation   . CHF exacerbation ([place]) 06/15/2020  . AKI (acute kidney injury) ([place]) 06/15/2020  . CKD (chronic kidney disease), stage III ([place]) 06/15/2020  . Anemia due to chronic blood loss   . Gastric AVM   . Angiodysplasia of small intestine ([place])   . Acute on chronic systolic (congestive) heart failure ([place]) 04/05/2020  . Anemia 04/05/2020  . PAF (paroxysmal atrial fibrillation) ([employer]) 04/05/2020  . OSA on CPAP 04/05/2020  . Syncope and collapse 04/05/2020  . Type 2 diabetes mellitus with hyperglycemia, with long-term current use of insulin ([place]) 12/03/2019  . Diabetic polyneuropathy associated with type 2 diabetes mellitus ([place]) 12/03/2019  . Hyperglycemia 12/03/2019  . History of hyperglycemia 12/03/2019  . Erectile dysfunction 12/03/2019  . CHF (congestive heart failure) ([place]) 10/11/2019  . Paranoid schizophrenia, chronic condition ([place]) 01/26/2015  . Severe recurrent major depressive disorder with psychotic features ([place]) 01/26/2015  . GAD (generalized anxiety disorder) 01/26/2015  . OCD (obsessive compulsive disorder) 01/26/2015  . Panic disorder without agoraphobia 01/26/2015  . Insomnia 01/26/2015    Current Outpatient Medications:  .  acetaminophen (TYLENOL) 500 MG tablet, Take 2 tablets (1,000 mg total) by mouth in the morning and at bedtime., Disp: 180 tablet, Rfl: 3 .  apixaban (ELIQUIS) 5 MG TABS tablet, Take 1  tablet (5 mg total) by mouth 2 (two) times daily., Disp: 180 tablet, Rfl: 3 .  atorvastatin (LIPITOR) 40 MG tablet, Take 1 tablet (40 mg total) by mouth daily at 6 PM., Disp: 90 tablet, Rfl: 3 .  blood glucose meter kit and supplies KIT, Dispense based on patient and insurance preference. Use up to four times daily as directed. (FOR ICD-9 250.00, 250.01)., Disp: 1 each, Rfl: 0 .  buPROPion (WELLBUTRIN SR) 100 MG 12 hr tablet, Take 1 tablet (100 mg total) by mouth 2 (two) times daily., Disp: 60 tablet, Rfl: 6 .  carvedilol (COREG) 12.5 MG tablet, Take 1.5 tablets (18.75 mg total) by mouth 2 (two) times daily with a meal., Disp: 90 tablet, Rfl: 6 .  cyclobenzaprine (FLEXERIL) 10 MG tablet, Take 10 mg by mouth 3 (three) times daily as needed for muscle spasms. , Disp: , Rfl:  .  ferrous sulfate 325 (65 FE) MG tablet, Take 1 tablet (325 mg total) by mouth daily with breakfast. (Patient taking differently: Take 325 mg by mouth 2 (two) times daily with a meal. ), Disp: 30 tablet, Rfl: 3 .  furosemide (LASIX) 40 MG tablet, Take 2 tablets (80 mg total) by mouth 2 (two) times daily., Disp: 360 tablet, Rfl: 3 .  gabapentin (NEURONTIN) 300 MG capsule, Take 2 capsules (total = 600 mg), by mouth, 3 times a day. (Patient taking differently: Take 600 mg by mouth 2 (two) times daily. ), Disp: 180 capsule, Rfl: 11 .  glipiZIDE (GLUCOTROL) 10 MG tablet, Take 1 tablet (10 mg total) by mouth 2 (two) times daily before a  meal., Disp: 60 tablet, Rfl: 6 .  insulin glargine (LANTUS) 100 UNIT/ML injection, Inject 0.2 mLs (20 Units total) into the skin daily., Disp: 30 mL, Rfl: 3 .  Insulin Lispro Prot & Lispro (HUMALOG MIX 75/25 KWIKPEN) (75-25) 100 UNIT/ML Kwikpen, Inject 30 Units into the skin 2 (two) times daily., Disp: 15 mL, Rfl: 11 .  metFORMIN (GLUCOPHAGE) 500 MG tablet, Take 1 tablet (500 mg total) by mouth 2 (two) times daily with a meal., Disp: 60 tablet, Rfl: 6 .  Multiple Vitamins-Minerals (CENTRUM SILVER 50+MEN)  TABS, Take 1 tablet by mouth daily., Disp: , Rfl:  .  pantoprazole (PROTONIX) 40 MG tablet, TAKE 1 TABLET (40 MG TOTAL) BY MOUTH 2 (TWO) TIMES DAILY., Disp: 60 tablet, Rfl: 1 .  potassium chloride SA (KLOR-CON M20) 20 MEQ tablet, Take 1 tablet (20 mEq total) by mouth daily., Disp: 30 tablet, Rfl: 0 .  QUEtiapine (SEROQUEL) 200 MG tablet, Take 1 tablet (200 mg total) by mouth at bedtime., Disp: 30 tablet, Rfl: 6 .  sacubitril-valsartan (ENTRESTO) 97-103 MG, Take 1 tablet by mouth 2 (two) times daily., Disp: 60 tablet, Rfl: 5 .  sildenafil (VIAGRA) 100 MG tablet, Take 1 tablet (100 mg total) by mouth daily as needed for erectile dysfunction., Disp: 30 tablet, Rfl: 2 .  spironolactone (ALDACTONE) 25 MG tablet, Take 1 tablet (25 mg total) by mouth daily., Disp: 30 tablet, Rfl: 11 .  tetrahydrozoline 0.05 % ophthalmic solution, Place 1 drop into both eyes daily., Disp: , Rfl:  .  TRUEPLUS PEN NEEDLES 31G X 6 MM MISC, USE AS DIRECTED, Disp: 100 each, Rfl: 0 No Known Allergies   Social History   Socioeconomic History  . Marital status: Legally Separated    Spouse name: Not on file  . Number of children: Not on file  . Years of education: Not on file  . Highest education level: Not on file  Occupational History  . Not on file  Tobacco Use  . 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 Smokeless tobacco: Never Used  Vaping Use  . Vaping Use: Never used  Substance and Sexual Activity  . Alcohol use: Yes    Alcohol/week: 2.0 standard drinks    Types: 2 Cans of beer per week  . Drug use: No  . Sexual activity: Yes  Other Topics Concern  . Not on file  Social History Narrative  . Not on file   Social Determinants of Health   Financial Resource Strain: Low Risk   . Difficulty of Paying Living Expenses: Not very hard  Food Insecurity: No Food Insecurity  . Worried About Charity fundraiser in the Last Year: Never true  . Ran Out of Food in the Last Year: Never true  Transportation Needs: Unmet  Transportation Needs  . Lack of Transportation (Medical): Yes  . Lack of Transportation (Non-Medical): Yes  Physical Activity:   . Days of Exercise per Week: Not on file  . Minutes of Exercise per Session: Not on file  Stress:   . Feeling of Stress : Not on file  Social Connections:   . Frequency of Communication with Friends and Family: Not on file  . Frequency of Social Gatherings with Friends and Family: Not on file  . Attends Religious Services: Not on file  . Active Member of Clubs or Organizations: Not on file  . Attends Archivist Meetings: Not on file  . Marital Status: Not on file  Intimate Partner Violence:   . Fear  of Current or Ex-Partner: Not on file  . Emotionally Abused: Not on file  . Physically Abused: Not on file  . Sexually Abused: Not on file    Physical Exam Vitals reviewed.  Constitutional:      Appearance: He is normal weight.  HENT:     Head: Normocephalic.     Nose: Nose normal.     Mouth/Throat:     Mouth: Mucous membranes are moist.     Pharynx: Oropharynx is clear.  Eyes:     Pupils: Pupils are equal, round, and reactive to light.  Cardiovascular:     Rate and Rhythm: Normal rate and regular rhythm.     Pulses: Normal pulses.     Heart sounds: Normal heart sounds.  Pulmonary:     Effort: Pulmonary effort is normal.     Breath sounds: Normal breath sounds.  Abdominal:     General: Abdomen is flat.     Palpations: Abdomen is soft.  Musculoskeletal:        General: Normal range of motion.     Cervical back: Normal range of motion.     Right lower leg: No edema.     Left lower leg: No edema.  Skin:    General: Skin is warm and dry.     Capillary Refill: Capillary refill takes less than 2 seconds.  Neurological:     General: No focal deficit present.     Mental Status: He is alert. Mental status is at baseline.  Psychiatric:        Mood and Affect: Mood normal.        Thought Content: Thought content normal.      Home  visit with [name] who reports feeling well with no complaints. [name] was compliant with medications over the last week. Medications were reviewed and confirmed. Pill box filled accordingly. [name] reports new medications for his diabetes is working out well and his CBG's have been less than 300. [name] reports this morning his sugar was 198 without medications. I assisted [name] in scheduling Shingles and Covid Booster Vaccine for 11/30 at 1000 at [facility] on [address]. Home visit complete. I will see [name] in one week.   Refills: Carvedilol, Needles/Syringe, Pen Needles.     Future Appointments  Date Time Provider [place]  10/26/2020 11:30 AM Valente David, RN [facility] None  11/02/2020  1:40 PM [name], MD [facility] None  12/03/2020  7:40 AM [facility] DEVICE REMOTES [facility]  12/08/2020  1:15 PM [name], MD [facility]  01/19/2021  9:00 AM Azzie Glatter, FNP [facility] None  03/04/2021  7:40 AM [facility] DEVICE REMOTES [facility]  03/14/2021  9:00 AM Azzie Glatter, FNP [facility] None  06/03/2021  7:40 AM [facility] DEVICE REMOTES [facility]  09/02/2021  7:40 AM [facility] DEVICE REMOTES [facility]  12/02/2021  7:40 AM [facility] DEVICE REMOTES [facility]  03/03/2022  7:40 AM [facility] DEVICE REMOTES [facility]     ACTION: Home visit completed Next visit planned for one week

## 2020-10-20 ENCOUNTER — Other Ambulatory Visit: Payer: Self-pay | Admitting: *Deleted

## 2020-10-20 NOTE — Patient Outreach (Signed)
[facility]) Care Management  10/20/2020  [name] September 26, 1961 [id number]   Call placed to member to follow up on HF and DM management, state he has been doing well.  Report weight has continued to decrease, today was 310 pounds (was 315 last outreach call).  Report blood sugars are decreasing, today was 187.  Discussed management of conditions in the upcoming days with Thanksgiving, wise meal choices discussed.  He verbalizes understanding.  Continue to have weekly visits from EMT through paramedicine program, will have follow up with heart failure clinic on 12/7.  Denies any urgent concerns, encouraged to contact this care manager with questions.  Agrees to follow up within the next month.  Goals Addressed            This Visit's Progress   . [facility] - Make and Keep All Appointments   On track    Follow Up Date 12/20   - ask family or friend for a ride - call to cancel if needed - keep a calendar with appointment dates    Why is this important?   Part of staying healthy is seeing the doctor for follow-up care.  If you forget your appointments, there are some things you can do to stay on track.    Notes:   11/24 - Upcoming appointments reviewed, encouraged to keep all dates    . [facility] - Track and Manage Activity and Exertion   On track    Follow Up Date 12/15   - drink water to stay hydrated during exercise - follow activity or exercise plan - make an activity or exercise plan - pace activity allowing for rest - track symptoms during activity in diary    Why is this important?   Exercising is very important when managing your heart failure.  It will help your heart get stronger.    Notes:   11/24 - Activity levels reviewed, encouraged to continue to increase as tolerated.  Daily weights reviewed      Valente David, Therapist, sports, MSN [place] [phone]

## 2020-10-25 ENCOUNTER — Other Ambulatory Visit: Payer: Self-pay | Admitting: Family Medicine

## 2020-10-25 MED FILL — CARVEDILOL 12.5 MG TABLET: 12.5 | 30 days supply | Qty: 90 | Fill #2

## 2020-10-26 ENCOUNTER — Other Ambulatory Visit: Payer: Self-pay | Admitting: Family Medicine

## 2020-10-26 ENCOUNTER — Other Ambulatory Visit (HOSPITAL_COMMUNITY): Payer: Self-pay

## 2020-10-26 ENCOUNTER — Telehealth (HOSPITAL_COMMUNITY): Payer: Self-pay | Admitting: Licensed Clinical Social Worker

## 2020-10-26 ENCOUNTER — Ambulatory Visit: Payer: Self-pay | Admitting: *Deleted

## 2020-10-26 DIAGNOSIS — E1165 Type 2 diabetes mellitus with hyperglycemia: Secondary | ICD-10-CM

## 2020-10-26 DIAGNOSIS — Z794 Long term (current) use of insulin: Secondary | ICD-10-CM

## 2020-10-26 MED ORDER — FREESTYLE LIBRE 14 DAY READER DEVI: 1.0000 | 11 refills | Status: DC | PRN

## 2020-10-26 MED ORDER — INSULIN GLARGINE 100 UNIT/ML ~~LOC~~ SOLN: 20.0000 [IU] | Freq: Every day | SUBCUTANEOUS | 12 refills | Status: DC

## 2020-10-26 MED ORDER — FREESTYLE LIBRE 14 DAY SENSOR MISC: 1.0000 | 11 refills | Status: DC | PRN

## 2020-10-26 MED ORDER — INSULIN SYRINGES (DISPOSABLE) U-100 0.3 ML MISC: 1.0000 | Freq: Three times a day (TID) | 3 refills | Status: DC

## 2020-10-26 MED FILL — TRUEPLUS SYR 0.3ML 31GX5/16: 31G X 5/16" | 33 days supply | Qty: 100 | Fill #0

## 2020-10-26 NOTE — Telephone Encounter (Signed)
Clinical biochemist requesting pt be enrolled in [employer]- CSW sent in enrollment form- pt should now be able to call and set up transport for his own rides for [facility] appts.  Will continue to follow and assist as needed  [name], [facility] Desk#: [phone] Cell#: [phone]

## 2020-10-26 NOTE — Progress Notes (Signed)
Paramedicine Encounter    Patient ID: ROWEN WILMER, male    DOB: 05/13/1961, 59 y.o.   MRN: [id number]   Patient Care Team: Azzie Glatter, FNP as PCP - General (Family Medicine) [name], MD as PCP - Cardiology (Cardiology) [name], MD as PCP - Advanced Heart Failure (Cardiology) [name], MD as PCP - Electrophysiology (Cardiology) Valente David, RN as [place] Management  Patient Active Problem List   Diagnosis Date Noted  . Acute blood loss anemia   . Angiodysplasia of stomach   . Chronic anticoagulation   . CHF exacerbation ([place]) 06/15/2020  . AKI (acute kidney injury) ([place]) 06/15/2020  . CKD (chronic kidney disease), stage III ([place]) 06/15/2020  . Anemia due to chronic blood loss   . Gastric AVM   . Angiodysplasia of small intestine ([place])   . Acute on chronic systolic (congestive) heart failure ([place]) 04/05/2020  . Anemia 04/05/2020  . PAF (paroxysmal atrial fibrillation) ([place]) 04/05/2020  . OSA on CPAP 04/05/2020  . Syncope and collapse 04/05/2020  . Type 2 diabetes mellitus with hyperglycemia, with long-term current use of insulin ([place]) 12/03/2019  . Diabetic polyneuropathy associated with type 2 diabetes mellitus ([place]) 12/03/2019  . Hyperglycemia 12/03/2019  . History of hyperglycemia 12/03/2019  . Erectile dysfunction 12/03/2019  . CHF (congestive heart failure) ([place]) 10/11/2019  . Paranoid schizophrenia, chronic condition ([place]) 01/26/2015  . Severe recurrent major depressive disorder with psychotic features ([place]) 01/26/2015  . GAD (generalized anxiety disorder) 01/26/2015  . OCD (obsessive compulsive disorder) 01/26/2015  . Panic disorder without agoraphobia 01/26/2015  . Insomnia 01/26/2015    Current Outpatient Medications:  .  acetaminophen (TYLENOL) 500 MG tablet, Take 2 tablets (1,000 mg total) by mouth in the morning and at bedtime., Disp: 180 tablet, Rfl: 3 .  apixaban (ELIQUIS) 5 MG TABS tablet, Take 1  tablet (5 mg total) by mouth 2 (two) times daily., Disp: 180 tablet, Rfl: 3 .  atorvastatin (LIPITOR) 40 MG tablet, Take 1 tablet (40 mg total) by mouth daily at 6 PM., Disp: 90 tablet, Rfl: 3 .  blood glucose meter kit and supplies KIT, Dispense based on patient and insurance preference. Use up to four times daily as directed. (FOR ICD-9 250.00, 250.01)., Disp: 1 each, Rfl: 0 .  buPROPion (WELLBUTRIN SR) 100 MG 12 hr tablet, Take 1 tablet (100 mg total) by mouth 2 (two) times daily., Disp: 60 tablet, Rfl: 6 .  carvedilol (COREG) 12.5 MG tablet, Take 1.5 tablets (18.75 mg total) by mouth 2 (two) times daily with a meal., Disp: 90 tablet, Rfl: 6 .  cyclobenzaprine (FLEXERIL) 10 MG tablet, Take 10 mg by mouth 3 (three) times daily as needed for muscle spasms. , Disp: , Rfl:  .  ferrous sulfate 325 (65 FE) MG tablet, Take 1 tablet (325 mg total) by mouth daily with breakfast. (Patient taking differently: Take 325 mg by mouth 2 (two) times daily with a meal. ), Disp: 30 tablet, Rfl: 3 .  furosemide (LASIX) 40 MG tablet, Take 2 tablets (80 mg total) by mouth 2 (two) times daily., Disp: 360 tablet, Rfl: 3 .  gabapentin (NEURONTIN) 300 MG capsule, Take 2 capsules (total = 600 mg), by mouth, 3 times a day. (Patient taking differently: Take 600 mg by mouth 2 (two) times daily. ), Disp: 180 capsule, Rfl: 11 .  glipiZIDE (GLUCOTROL) 10 MG tablet, Take 1 tablet (10 mg total) by mouth 2 (two) times daily before a  meal., Disp: 60 tablet, Rfl: 6 .  insulin glargine (LANTUS) 100 UNIT/ML injection, Inject 0.2 mLs (20 Units total) into the skin daily., Disp: 30 mL, Rfl: 12 .  Insulin Lispro Prot & Lispro (HUMALOG MIX 75/25 KWIKPEN) (75-25) 100 UNIT/ML Kwikpen, Inject 30 Units into the skin 2 (two) times daily., Disp: 15 mL, Rfl: 11 .  Insulin Syringes, Disposable, U-100 0.3 ML MISC, 1 each by Does not apply route 3 (three) times daily., Disp: 300 each, Rfl: 3 .  metFORMIN (GLUCOPHAGE) 500 MG tablet, Take 1 tablet (500 mg  total) by mouth 2 (two) times daily with a meal., Disp: 60 tablet, Rfl: 6 .  Multiple Vitamins-Minerals (CENTRUM SILVER 50+MEN) TABS, Take 1 tablet by mouth daily., Disp: , Rfl:  .  pantoprazole (PROTONIX) 40 MG tablet, TAKE 1 TABLET (40 MG TOTAL) BY MOUTH 2 (TWO) TIMES DAILY., Disp: 60 tablet, Rfl: 1 .  potassium chloride SA (KLOR-CON M20) 20 MEQ tablet, Take 1 tablet (20 mEq total) by mouth daily., Disp: 30 tablet, Rfl: 0 .  QUEtiapine (SEROQUEL) 200 MG tablet, Take 1 tablet (200 mg total) by mouth at bedtime., Disp: 30 tablet, Rfl: 6 .  sacubitril-valsartan (ENTRESTO) 97-103 MG, Take 1 tablet by mouth 2 (two) times daily., Disp: 60 tablet, Rfl: 5 .  sildenafil (VIAGRA) 100 MG tablet, Take 1 tablet (100 mg total) by mouth daily as needed for erectile dysfunction., Disp: 30 tablet, Rfl: 2 .  spironolactone (ALDACTONE) 25 MG tablet, Take 1 tablet (25 mg total) by mouth daily., Disp: 30 tablet, Rfl: 11 .  tetrahydrozoline 0.05 % ophthalmic solution, Place 1 drop into both eyes daily., Disp: , Rfl:  .  TRUEPLUS PEN NEEDLES 31G X 6 MM MISC, USE AS DIRECTED, Disp: 100 each, Rfl: 0 No Known Allergies   Social History   Socioeconomic History  . Marital status: Legally Separated    Spouse name: Not on file  . Number of children: Not on file  . Years of education: Not on file  . Highest education level: Not on file  Occupational History  . Not on file  Tobacco Use  . 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 Smokeless tobacco: Never Used  Vaping Use  . Vaping Use: Never used  Substance and Sexual Activity  . Alcohol use: Yes    Alcohol/week: 2.0 standard drinks    Types: 2 Cans of beer per week  . Drug use: No  . Sexual activity: Yes  Other Topics Concern  . Not on file  Social History Narrative  . Not on file   Social Determinants of Health   Financial Resource Strain: Low Risk   . Difficulty of Paying Living Expenses: Not very hard  Food Insecurity: No Food Insecurity  . Worried About  Charity fundraiser in the Last Year: Never true  . Ran Out of Food in the Last Year: Never true  Transportation Needs: Unmet Transportation Needs  . Lack of Transportation (Medical): Yes  . Lack of Transportation (Non-Medical): Yes  Physical Activity:   . Days of Exercise per Week: Not on file  . Minutes of Exercise per Session: Not on file  Stress:   . Feeling of Stress : Not on file  Social Connections:   . Frequency of Communication with Friends and Family: Not on file  . Frequency of Social Gatherings with Friends and Family: Not on file  . Attends Religious Services: Not on file  . Active Member of Clubs or Organizations: Not on file  .  Attends Archivist Meetings: Not on file  . Marital Status: Not on file  Intimate Partner Violence:   . Fear of Current or Ex-Partner: Not on file  . Emotionally Abused: Not on file  . Physically Abused: Not on file  . Sexually Abused: Not on file    Physical Exam Vitals reviewed.  Constitutional:      Appearance: He is normal weight.  HENT:     Head: Normocephalic.     Nose: Nose normal.     Mouth/Throat:     Mouth: Mucous membranes are moist.  Eyes:     Pupils: Pupils are equal, round, and reactive to light.  Cardiovascular:     Rate and Rhythm: Normal rate and regular rhythm.     Pulses: Normal pulses.     Heart sounds: Normal heart sounds.  Pulmonary:     Effort: Pulmonary effort is normal.     Breath sounds: Normal breath sounds.  Abdominal:     General: Abdomen is flat.     Palpations: Abdomen is soft.  Musculoskeletal:        General: Normal range of motion.     Cervical back: Normal range of motion.     Right lower leg: No edema.     Left lower leg: No edema.  Skin:    General: Skin is warm and dry.     Capillary Refill: Capillary refill takes less than 2 seconds.  Neurological:     General: No focal deficit present.     Mental Status: He is alert. Mental status is at baseline.  Psychiatric:        Mood  and Affect: Mood normal.      Arrived for home visit for [facility] who was alert and oriented seated in his home with no complaints. [name] reports he got his COVID Booster Vaccine today and it has him feeling fatigued with a slight headache but no other complaints. [name] denied shortness of breath, chest pain or dizziness. Vitals were obtained and are as noted. [name] got a new phone so we were able to [employer] and utilize the sample of the Freestyle given to him by his PCP. I cleaned his right arm to prep the site and I placed the monitoring sensor on the back of the right arm. Device connected and scanned successfully. CBG reading 202. I messaged PCP on same to get the device RX sent to [facility]. Medications were verified and confirmed. Pill box filled accordingly. [name] also informed me he signed up for [employer]. I informed him to take information to his next appointment and/or pharmacy visit. He agreed with plan. I will see [name] in the clinic next week in which he was informed of and agreed with. He was given written appointment card. I also informed Eliezer Lofts of need of referral for [employer]. Referral was placed. I will assist in setting up transportation.    Refills: Metformin   Future Appointments  Date Time Provider [place]  11/02/2020  1:40 PM [name], MD [facility] None  11/17/2020 11:30 AM Valente David, RN [facility] None  12/03/2020  7:40 AM [facility] DEVICE REMOTES [facility]  12/08/2020  1:15 PM [name], MD [facility]  01/19/2021  9:00 AM Azzie Glatter, FNP [facility] None  03/04/2021  7:40 AM [facility] DEVICE REMOTES [facility]  03/14/2021  9:00 AM Azzie Glatter, FNP [facility] None  06/03/2021  7:40 AM [facility]  DEVICE REMOTES [facility]  09/02/2021  7:40 AM [facility] DEVICE REMOTES [facility]  12/02/2021  7:40 AM [facility] DEVICE REMOTES  [facility]  03/03/2022  7:40 AM [facility] DEVICE REMOTES [facility]     ACTION: Home visit completed Next visit planned for one week

## 2020-11-01 MED FILL — METFORMIN HCL 500 MG TABS: 500 | 30 days supply | Qty: 60 | Fill #1

## 2020-11-02 ENCOUNTER — Encounter (HOSPITAL_COMMUNITY): Payer: Self-pay | Admitting: Cardiology

## 2020-11-02 ENCOUNTER — Other Ambulatory Visit (HOSPITAL_COMMUNITY): Payer: Self-pay

## 2020-11-02 ENCOUNTER — Other Ambulatory Visit: Payer: Self-pay | Admitting: Cardiology

## 2020-11-02 ENCOUNTER — Ambulatory Visit (HOSPITAL_COMMUNITY)
Admission: RE | Admit: 2020-11-02 | Discharge: 2020-11-02 | Disposition: A | Discharge status: Home / Self Care | Payer: Medicare Other | Source: Ambulatory Visit | Attending: Cardiology | Admitting: Cardiology

## 2020-11-02 ENCOUNTER — Other Ambulatory Visit: Payer: Self-pay

## 2020-11-02 VITALS — BP 152/90 | HR 70 | Wt 321.2 lb

## 2020-11-02 DIAGNOSIS — Z8249 Family history of ischemic heart disease and other diseases of the circulatory system: Secondary | ICD-10-CM | POA: Insufficient documentation

## 2020-11-02 DIAGNOSIS — E119 Type 2 diabetes mellitus without complications: Secondary | ICD-10-CM | POA: Insufficient documentation

## 2020-11-02 DIAGNOSIS — F209 Schizophrenia, unspecified: Secondary | ICD-10-CM | POA: Insufficient documentation

## 2020-11-02 DIAGNOSIS — E669 Obesity, unspecified: Secondary | ICD-10-CM | POA: Insufficient documentation

## 2020-11-02 DIAGNOSIS — E785 Hyperlipidemia, unspecified: Secondary | ICD-10-CM | POA: Insufficient documentation

## 2020-11-02 DIAGNOSIS — Z79899 Other long term (current) drug therapy: Secondary | ICD-10-CM | POA: Insufficient documentation

## 2020-11-02 DIAGNOSIS — Z794 Long term (current) use of insulin: Secondary | ICD-10-CM | POA: Insufficient documentation

## 2020-11-02 DIAGNOSIS — I110 Hypertensive heart disease with heart failure: Secondary | ICD-10-CM | POA: Insufficient documentation

## 2020-11-02 DIAGNOSIS — Z955 Presence of coronary angioplasty implant and graft: Secondary | ICD-10-CM | POA: Insufficient documentation

## 2020-11-02 DIAGNOSIS — G4733 Obstructive sleep apnea (adult) (pediatric): Secondary | ICD-10-CM | POA: Diagnosis not present

## 2020-11-02 DIAGNOSIS — Z87891 Personal history of nicotine dependence: Secondary | ICD-10-CM | POA: Diagnosis not present

## 2020-11-02 DIAGNOSIS — I5022 Chronic systolic (congestive) heart failure: Secondary | ICD-10-CM | POA: Diagnosis present

## 2020-11-02 DIAGNOSIS — Z7901 Long term (current) use of anticoagulants: Secondary | ICD-10-CM | POA: Insufficient documentation

## 2020-11-02 DIAGNOSIS — I480 Paroxysmal atrial fibrillation: Secondary | ICD-10-CM | POA: Diagnosis not present

## 2020-11-02 DIAGNOSIS — I2510 Atherosclerotic heart disease of native coronary artery without angina pectoris: Secondary | ICD-10-CM | POA: Insufficient documentation

## 2020-11-02 LAB — BASIC METABOLIC PANEL
Anion gap: 10 (ref 5–15)
BUN: 26 mg/dL — ABNORMAL HIGH (ref 6–20)
CO2: 23 mmol/L (ref 22–32)
Calcium: 9.2 mg/dL (ref 8.9–10.3)
Chloride: 101 mmol/L (ref 98–111)
Creatinine, Ser: 1.12 mg/dL (ref 0.61–1.24)
GFR, Estimated: >60 mL/min (ref >60)
Glucose, Bld: 176 mg/dL — ABNORMAL HIGH (ref 70–99)
Potassium: 4.1 mmol/L (ref 3.5–5.1)
Sodium: 134 mmol/L — ABNORMAL LOW (ref 135–145)

## 2020-11-02 MED ORDER — CARVEDILOL 25 MG PO TABS
25.0000 mg | ORAL_TABLET | Freq: Two times a day (BID) | ORAL | 3 refills | Status: DC
Start: 2020-11-02 — End: 2021-03-08

## 2020-11-02 MED ORDER — BIDIL 20-37.5 MG PO TABS: 1.0000 | ORAL_TABLET | Freq: Three times a day (TID) | ORAL | 3 refills | Status: DC

## 2020-11-02 MED FILL — PANTOPRAZOLE SOD DR 40 MG T: 40 | 30 days supply | Qty: 60 | Fill #0

## 2020-11-02 MED FILL — SPIRONOLACTONE 25 MG TABLET: 25 | 30 days supply | Qty: 30 | Fill #7

## 2020-11-02 MED FILL — QUETIAPINE FUMARATE 200 MG: 200 | 30 days supply | Qty: 30 | Fill #3

## 2020-11-02 MED FILL — BUPROPION HCL SR 100 MG TAB: 100 | 30 days supply | Qty: 60 | Fill #6

## 2020-11-02 NOTE — Progress Notes (Signed)
Paramedicine Encounter   Arrived for clinic visit for [facility] where he saw Dr. Aundra Dubin today. [name]'s weight was up by 12lbs today. No edema or swelling noted. [name] reports he has been eating a lot due to stress. Dr. Aundra Dubin suggested exercising and [facility] referral.   [name] needs help getting new filters for his CPAP machine, I will assist.    [name] started on BIDIL by Dr. Aundra Dubin and Coreg increased to 25mg .    Medications verified and confirmed. Pill box filled accordingly.   Metformin missing- SAT PM, SUN AM/PM, MON AM/PM, TUES AM.   EKG and Labs obtained today. Dr. Aundra Dubin wants to see [name] in clinic again in 6 weeks.   I will continue to follow [name] and assist.    ACTION: Home visit completed Next visit planned for one week

## 2020-11-02 NOTE — Progress Notes (Signed)
PCP: Azzie Glatter, FNP Cardiology: Dr. Radford Pax HF Cardiology: Dr. Aundra Dubin  59 y.o. with history of chronic systolic CHF, CAD, type 2 diabetes, paroxysmal atrial fibrillation, and schizophrenia was referred by Dr. Radford Pax for evaluation of CHF.  Patient had OM2 PCI in 2012.  In 11/20, he was admitted with CHF. Echo showed EF 25-30% with diffuse hypokinesis.  LHC was done, showing occluded OM2 at prior stent, 90% D1 stenosis, and extensive diffuse RCA disease.  No intervention.  He was thought to be in paroxysmal atrial fibrillation during this appointment and apixaban was started.   He does not smoke, rarely drinks, and does not use drugs.  His mother had "heart problems."    He can write his name only. He is only able to read a few words. Thinks he completed the 7th grade.    Echo in 5/21 showed EF 30% with diffuse hypokinesis, mild LVH, PASP 38, mildly decreased RV systolic function, IVC dilated. Biotronik ICD placed.   He was hospitalized in 7/21 with upper GI bleeding and CHF exacerbation.  EGD showed duodenal AVMs, treated with APC. He was diuresed.   He returns for followup of CHF.  He is being followed by paramedicine.  Weight is up 13 lbs.  He says that he ate a lot over Thanksgiving.  No significant exertional dyspnea. He is not consistent with CPAP, needs a new filter.  No chest pain.  No orthopnea/PND.  BP high today, says he took all his meds.   Labs (1/21): LDL 66, HDL 42, hgb 11.3, K 4.7, creatinine 1.32 Labs (4/21): K 5, creatinine 1.37 Labs (7/21): K 4.1, creatinine 1.26, hgb 9.3 Labs (8/21): LDL 51, K 3.8, creatinine 1.3 Labs (11/21): K 4, creatinine 1.42, hgb 13.1, hgbA1c 11.7  ECG (personally reviewed): NSR, poor RWP.   PMH:  1. Atrial fibrillation: Paroxysmal 2. Type 2 diabetes 3. HTN 4. Hyperlipidemia 5. Schizophrenia 6. CAD: PCI OM2 in 2012.  - LHC (11/20): 90% D1 stenosis, totally occluded OM2 at stent, serial 85%/70%/60% RCA stenoses.  7. 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   - Echo (11/20): EF 25-30%, global hypokinesis.  - Echo (5/21): EF 30% with diffuse hypokinesis, mild LVH, PASP 38, mildly decreased RV systolic function, IVC dilated. 8. Upper GI bleeding: 7/21, duodenal AVMs treated with APC.  9. OSA: Does not use CPAP regularly.   Social History   Socioeconomic History  . Marital status: Legally Separated    Spouse name: Not on file  . Number of children: Not on file  . Years of education: Not on file  . Highest education level: Not on file  Occupational History  . Not on file  Tobacco Use  . 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 Smokeless tobacco: Never Used  Vaping Use  . Vaping Use: Never used  Substance and Sexual Activity  . Alcohol use: Yes    Alcohol/week: 2.0 standard drinks    Types: 2 Cans of beer per week  . Drug use: No  . Sexual activity: Yes  Other Topics Concern  . Not on file  Social History Narrative  . Not on file   Social Determinants of Health   Financial Resource Strain: Low Risk   . Difficulty of Paying Living Expenses: Not very hard  Food Insecurity: No Food Insecurity  . Worried About Charity fundraiser in the Last Year: Never true  . Ran Out of Food in the Last Year: Never true  Transportation Needs: Unmet Transportation  Needs  . Lack of Transportation (Medical): Yes  . Lack of Transportation (Non-Medical): Yes  Physical Activity:   . Days of Exercise per Week: Not on file  . Minutes of Exercise per Session: Not on file  Stress:   . Feeling of Stress : Not on file  Social Connections:   . Frequency of Communication with Friends and Family: Not on file  . Frequency of Social Gatherings with Friends and Family: Not on file  . Attends Religious Services: Not on file  . Active Member of Clubs or Organizations: Not on file  . Attends Archivist Meetings: Not on file  . Marital Status: Not on file  Intimate Partner Violence:   . Fear of Current or  Ex-Partner: Not on file  . Emotionally Abused: Not on file  . Physically Abused: Not on file  . Sexually Abused: Not on file   Family History  Problem Relation Age of Onset  . Heart failure Mother   . Mental illness Sister   . Mental illness Sister    ROS: All systems reviewed and negative except as per HPI.  Current Meds  Medication Sig  . acetaminophen (TYLENOL) 500 MG tablet Take 2 tablets (1,000 mg total) by mouth in the morning and at bedtime.  [name] Kitchen apixaban (ELIQUIS) 5 MG TABS tablet Take 1 tablet (5 mg total) by mouth 2 (two) times daily.  [name] Kitchen atorvastatin (LIPITOR) 40 MG tablet Take 1 tablet (40 mg total) by mouth daily at 6 PM.  . blood glucose meter kit and supplies KIT Dispense based on patient and insurance preference. Use up to four times daily as directed. (FOR ICD-9 250.00, 250.01).  [name] Kitchen buPROPion (WELLBUTRIN SR) 100 MG 12 hr tablet Take 1 tablet (100 mg total) by mouth 2 (two) times daily.  . carvedilol (COREG) 25 MG tablet Take 1 tablet (25 mg total) by mouth 2 (two) times daily with a meal.  . Continuous Blood Gluc Receiver (FREESTYLE LIBRE 14 DAY READER) DEVI 1 each by Does not apply route as needed.  . Continuous Blood Gluc Sensor (FREESTYLE LIBRE 14 DAY SENSOR) MISC 1 each by Does not apply route as needed.  . cyclobenzaprine (FLEXERIL) 10 MG tablet Take 10 mg by mouth 3 (three) times daily as needed for muscle spasms.   . ferrous sulfate (FEROSUL) 325 (65 FE) MG tablet Take 325 mg by mouth in the morning and at bedtime.  . furosemide (LASIX) 40 MG tablet Take 2 tablets (80 mg total) by mouth 2 (two) times daily.  [name] Kitchen gabapentin (NEURONTIN) 300 MG capsule Take 2 capsules (total = 600 mg), by mouth, 3 times a day. (Patient taking differently: Take 600 mg by mouth 2 (two) times daily. )  . glipiZIDE (GLUCOTROL) 10 MG tablet Take 1 tablet (10 mg total) by mouth 2 (two) times daily before a meal.  . insulin glargine (LANTUS) 100 UNIT/ML injection Inject 0.2 mLs (20 Units total)  into the skin daily.  . Insulin Lispro Prot & Lispro (HUMALOG MIX 75/25 KWIKPEN) (75-25) 100 UNIT/ML Kwikpen Inject 30 Units into the skin 2 (two) times daily.  . Insulin Syringes, Disposable, U-100 0.3 ML MISC 1 each by Does not apply route 3 (three) times daily.  . metFORMIN (GLUCOPHAGE) 500 MG tablet Take 1 tablet (500 mg total) by mouth 2 (two) times daily with a meal.  . Multiple Vitamins-Minerals (CENTRUM SILVER 50+MEN) TABS Take 1 tablet by mouth daily.  . pantoprazole (PROTONIX) 40 MG tablet TAKE 1 TABLET (  40 MG TOTAL) BY MOUTH 2 (TWO) TIMES DAILY.  [name] Kitchen potassium chloride SA (KLOR-CON M20) 20 MEQ tablet Take 1 tablet (20 mEq total) by mouth daily.  . QUEtiapine (SEROQUEL) 200 MG tablet Take 1 tablet (200 mg total) by mouth at bedtime.  . sacubitril-valsartan (ENTRESTO) 97-103 MG Take 1 tablet by mouth 2 (two) times daily.  . sildenafil (VIAGRA) 100 MG tablet Take 1 tablet (100 mg total) by mouth daily as needed for erectile dysfunction.  [name] Kitchen spironolactone (ALDACTONE) 25 MG tablet Take 1 tablet (25 mg total) by mouth daily.  [name] Kitchen tetrahydrozoline 0.05 % ophthalmic solution Place 1 drop into both eyes daily.  . TRUEPLUS PEN NEEDLES 31G X 6 MM MISC USE AS DIRECTED  . [DISCONTINUED] carvedilol (COREG) 12.5 MG tablet Take 1.5 tablets (18.75 mg total) by mouth 2 (two) times daily with a meal.   BP (!) 152/90   Pulse 70   Wt (!) 145.7 kg (321 lb 3.2 oz)   SpO2 96%   BMI 41.24 kg/m   Wt Readings from Last 3 Encounters:  11/02/20 (!) 145.7 kg (321 lb 3.2 oz)  10/26/20 (!) 141.5 kg (312 lb)  10/19/20 (!) 140.6 kg (310 lb)   .med Vitals:   11/02/20 1349  BP: (!) 152/90  Pulse: 70  SpO2: 96%   General: NAD, obese.  Neck: No JVD, no thyromegaly or thyroid nodule.  Lungs: Clear to auscultation bilaterally with normal respiratory effort. CV: Nondisplaced PMI.  Heart regular S1/S2, no S3/S4, no murmur.  No peripheral edema.  No carotid bruit.  Normal pedal pulses.  Abdomen: Soft, nontender, no  hepatosplenomegaly, no distention.  Skin: Intact without lesions or rashes.  Neurologic: Alert and oriented x 3.  Psych: Normal affect. Extremities: No clubbing or cyanosis.  HEENT: Normal.   Assessment/Plan: 1. Chronic systolic CHF: Echo in 25/42 with EF 25-30%, echo 5/21 with EF 30% with mildly decreased RV systolic function. Biotronik ICD.  Suspect mixed ischemic/nonischemic cardiomyopathy.  He is not volume overloaded on exam today, NYHA class II symptoms. - Continue Lasix 80 mg bid. BMET today.  - Continue Entresto 97/103 bid.  - Increase Coreg to 25 mg bid.  - Continue spironolactone 25 daily.   - Add Bidil 1 tab tid.  - No SGLT2 inhibitor yet with hgbA1c > 11.  2. CAD: Last cath in 11/20 with occluded OM2 stent, 90% D1, severe diffuse disease in the RCA (no intervention). No recent chest pain.  - No ASA given apixaban use.  - Continue atorvastatin. Good lipids in 8/21.  3. Atrial fibrillation: Paroxysmal.  He is in NSR today. Denies overt bleeding.  - Continue apixaban 5 mg bid.  4. DMII: Continue home regimen.  5. OSA: Continue CPAP => needs to use more regularly. Will work on getting him a new filter.  6. Upper GI bleeding: Occurred in 7/21.  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  No overt bleeding.   7. Obesity: Weight up today.  - Restart working out at [employer].   - I will refer him to [facility].   Loralie Champagne 11/02/2020

## 2020-11-02 NOTE — Patient Instructions (Addendum)
START Bidil (1 tablet) Three times a day  INCREASE Carvedilol 25mg  (1 tablet) twice daily  You have been referred to [facility]. They will contact you to schedule an appointment  Labs done today, your results will be available in MyChart, we will contact you for abnormal readings.  Your physician recommends that you schedule a follow-up appointment in: 6 weeks with our APP clinic  If you have any questions or concerns before your next appointment please send [place] a message through [place] or call our office at [phone].    TO LEAVE A MESSAGE FOR THE NURSE SELECT OPTION 2, PLEASE LEAVE A MESSAGE INCLUDING: . YOUR NAME . DATE OF BIRTH . CALL BACK NUMBER . REASON FOR CALL**this is important as we prioritize the call backs  YOU WILL RECEIVE A CALL BACK THE SAME DAY AS LONG AS YOU CALL BEFORE 4:00 PM

## 2020-11-03 ENCOUNTER — Other Ambulatory Visit: Payer: Self-pay | Admitting: Cardiology

## 2020-11-03 ENCOUNTER — Other Ambulatory Visit (HOSPITAL_COMMUNITY): Payer: Self-pay | Admitting: *Deleted

## 2020-11-03 MED ORDER — ISOSORBIDE MONONITRATE ER 30 MG PO TB24: 15.0000 mg | ORAL_TABLET | Freq: Every day | ORAL | 3 refills | Status: DC

## 2020-11-03 MED ORDER — HYDRALAZINE HCL 25 MG PO TABS: 37.5000 mg | ORAL_TABLET | Freq: Three times a day (TID) | ORAL | 3 refills | Status: DC

## 2020-11-03 MED FILL — ISOSORBIDE MN ER 30 MG TAB: 30 | 90 days supply | Qty: 45 | Fill #0

## 2020-11-03 MED FILL — CARVEDILOL 25 MG TABLET: 25 | 30 days supply | Qty: 60 | Fill #0

## 2020-11-03 MED FILL — hydrALAZINE HCL 25 MG TABS: 25 | 60 days supply | Qty: 270 | Fill #0

## 2020-11-04 ENCOUNTER — Other Ambulatory Visit: Payer: Self-pay | Admitting: Family Medicine

## 2020-11-04 MED FILL — TRUEPLUS PEN NDL 31G X 1/4: 31G X 6 MM | 30 days supply | Qty: 100 | Fill #0

## 2020-11-09 ENCOUNTER — Other Ambulatory Visit (HOSPITAL_COMMUNITY): Payer: Self-pay

## 2020-11-09 ENCOUNTER — Telehealth (HOSPITAL_COMMUNITY): Payer: Self-pay | Admitting: *Deleted

## 2020-11-09 ENCOUNTER — Telehealth: Payer: Self-pay

## 2020-11-09 NOTE — Telephone Encounter (Signed)
Jeris Penta from [employer] [facility] states the patient felt some vibrations and wanted to make sure he did not have any shocks from his ICD. She would like the nurse to call her at their earliest convenience. Her phone number is [phone].

## 2020-11-09 NOTE — Telephone Encounter (Signed)
Attempted to return phone call to [facility].  Biotronik devices do not Vibrate (confirmed with rep).  Current transmission does not show any alert conditions.

## 2020-11-09 NOTE — Progress Notes (Signed)
Paramedicine Encounter    Patient ID: [name], male    DOB: Mar 14, 1961, 59 y.o.   MRN: [id number]   Patient Care Team: Azzie Glatter, FNP as PCP - General (Family Medicine) [name], MD as PCP - Cardiology (Cardiology) [name], MD as PCP - Advanced Heart Failure (Cardiology) [name], MD as PCP - Electrophysiology (Cardiology) Valente David, RN as [place] [facility]  Patient Active Problem List   Diagnosis Date Noted  . Acute blood loss anemia   . Angiodysplasia of stomach   . Chronic anticoagulation   . CHF exacerbation ([place]) 06/15/2020  . AKI (acute kidney injury) ([place]) 06/15/2020  . CKD (chronic kidney disease), stage III ([place]) 06/15/2020  . Anemia due to chronic blood loss   . Gastric AVM   . Angiodysplasia of small intestine ([place])   . Acute on chronic systolic (congestive) heart failure ([place]) 04/05/2020  . Anemia 04/05/2020  . PAF (paroxysmal atrial fibrillation) ([place]) 04/05/2020  . OSA on CPAP 04/05/2020  . Syncope and collapse 04/05/2020  . Type 2 diabetes mellitus with hyperglycemia, with long-term current use of insulin ([place]) 12/03/2019  . Diabetic polyneuropathy associated with type 2 diabetes mellitus ([place]) 12/03/2019  . Hyperglycemia 12/03/2019  . History of hyperglycemia 12/03/2019  . Erectile dysfunction 12/03/2019  . CHF (congestive heart failure) ([place]) 10/11/2019  . Paranoid schizophrenia, chronic condition ([place]) 01/26/2015  . Severe recurrent major depressive disorder with psychotic features ([place]) 01/26/2015  . GAD (generalized anxiety disorder) 01/26/2015  . OCD (obsessive compulsive disorder) 01/26/2015  . Panic disorder without agoraphobia 01/26/2015  . Insomnia 01/26/2015    Current Outpatient Medications:  .  acetaminophen (TYLENOL) 500 MG tablet, Take 2 tablets (1,000 mg total) by mouth in the morning and at bedtime., Disp: 180 tablet, Rfl: 3 .  apixaban (ELIQUIS) 5 MG TABS tablet, Take 1  tablet (5 mg total) by mouth 2 (two) times daily., Disp: 180 tablet, Rfl: 3 .  atorvastatin (LIPITOR) 40 MG tablet, Take 1 tablet (40 mg total) by mouth daily at 6 PM., Disp: 90 tablet, Rfl: 3 .  blood glucose meter kit and supplies KIT, Dispense based on patient and insurance preference. Use up to four times daily as directed. (FOR ICD-9 250.00, 250.01)., Disp: 1 each, Rfl: 0 .  buPROPion (WELLBUTRIN SR) 100 MG 12 hr tablet, Take 1 tablet (100 mg total) by mouth 2 (two) times daily., Disp: 60 tablet, Rfl: 6 .  carvedilol (COREG) 25 MG tablet, Take 1 tablet (25 mg total) by mouth 2 (two) times daily with a meal., Disp: 60 tablet, Rfl: 3 .  Continuous Blood Gluc Receiver (FREESTYLE LIBRE 14 DAY READER) DEVI, 1 each by Does not apply route as needed., Disp: 1 each, Rfl: 11 .  Continuous Blood Gluc Sensor (FREESTYLE LIBRE 14 DAY SENSOR) MISC, 1 each by Does not apply route as needed., Disp: 1 each, Rfl: 11 .  cyclobenzaprine (FLEXERIL) 10 MG tablet, Take 10 mg by mouth 3 (three) times daily as needed for muscle spasms. , Disp: , Rfl:  .  ferrous sulfate (FEROSUL) 325 (65 FE) MG tablet, Take 325 mg by mouth in the morning and at bedtime., Disp: , Rfl:  .  furosemide (LASIX) 40 MG tablet, Take 2 tablets (80 mg total) by mouth 2 (two) times daily., Disp: 360 tablet, Rfl: 3 .  gabapentin (NEURONTIN) 300 MG capsule, Take 2 capsules (total = 600 mg), by mouth, 3 times a day. (Patient taking differently:  Take 600 mg by mouth 2 (two) times daily. ), Disp: 180 capsule, Rfl: 11 .  glipiZIDE (GLUCOTROL) 10 MG tablet, Take 1 tablet (10 mg total) by mouth 2 (two) times daily before a meal., Disp: 60 tablet, Rfl: 6 .  hydrALAZINE (APRESOLINE) 25 MG tablet, Take 1.5 tablets (37.5 mg total) by mouth 3 (three) times daily., Disp: 270 tablet, Rfl: 3 .  insulin glargine (LANTUS) 100 UNIT/ML injection, Inject 0.2 mLs (20 Units total) into the skin daily., Disp: 30 mL, Rfl: 12 .  Insulin Lispro Prot & Lispro (HUMALOG MIX  75/25 KWIKPEN) (75-25) 100 UNIT/ML Kwikpen, Inject 30 Units into the skin 2 (two) times daily., Disp: 15 mL, Rfl: 11 .  Insulin Syringes, Disposable, U-100 0.3 ML MISC, 1 each by Does not apply route 3 (three) times daily., Disp: 300 each, Rfl: 3 .  isosorbide mononitrate (IMDUR) 30 MG 24 hr tablet, Take 0.5 tablets (15 mg total) by mouth daily., Disp: 45 tablet, Rfl: 3 .  metFORMIN (GLUCOPHAGE) 500 MG tablet, Take 1 tablet (500 mg total) by mouth 2 (two) times daily with a meal., Disp: 60 tablet, Rfl: 6 .  Multiple Vitamins-Minerals (CENTRUM SILVER 50+MEN) TABS, Take 1 tablet by mouth daily., Disp: , Rfl:  .  pantoprazole (PROTONIX) 40 MG tablet, TAKE 1 TABLET (40 MG TOTAL) BY MOUTH 2 (TWO) TIMES DAILY., Disp: 60 tablet, Rfl: 1 .  potassium chloride SA (KLOR-CON M20) 20 MEQ tablet, Take 1 tablet (20 mEq total) by mouth daily., Disp: 30 tablet, Rfl: 0 .  QUEtiapine (SEROQUEL) 200 MG tablet, Take 1 tablet (200 mg total) by mouth at bedtime., Disp: 30 tablet, Rfl: 6 .  sacubitril-valsartan (ENTRESTO) 97-103 MG, Take 1 tablet by mouth 2 (two) times daily., Disp: 60 tablet, Rfl: 5 .  sildenafil (VIAGRA) 100 MG tablet, Take 1 tablet (100 mg total) by mouth daily as needed for erectile dysfunction., Disp: 30 tablet, Rfl: 2 .  spironolactone (ALDACTONE) 25 MG tablet, Take 1 tablet (25 mg total) by mouth daily., Disp: 30 tablet, Rfl: 11 .  tetrahydrozoline 0.05 % ophthalmic solution, Place 1 drop into both eyes daily., Disp: , Rfl:  .  TRUEPLUS PEN NEEDLES 31G X 6 MM MISC, USE AS DIRECTED, Disp: 100 each, Rfl: 0 No Known Allergies   Social History   Socioeconomic History  . Marital status: Legally Separated    Spouse name: Not on file  . Number of children: Not on file  . Years of education: Not on file  . Highest education level: Not on file  Occupational History  . Not on file  Tobacco Use  . 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 Smokeless tobacco: Never Used  Vaping Use  . Vaping Use: Never used   Substance and Sexual Activity  . Alcohol use: Yes    Alcohol/week: 2.0 standard drinks    Types: 2 Cans of beer per week  . Drug use: No  . Sexual activity: Yes  Other Topics Concern  . Not on file  Social History Narrative  . Not on file   Social Determinants of Health   Financial Resource Strain: Low Risk   . Difficulty of Paying Living Expenses: Not very hard  Food Insecurity: No Food Insecurity  . Worried About Charity fundraiser in the Last Year: Never true  . Ran Out of Food in the Last Year: Never true  Transportation Needs: Unmet Transportation Needs  . Lack of Transportation (Medical): Yes  . Lack of Transportation (Non-Medical): Yes  Physical Activity: Not on file  Stress: Not on file  Social Connections: Not on file  Intimate Partner Violence: Not on file    Physical Exam Vitals reviewed.  Constitutional:      Appearance: He is normal weight.  HENT:     Head: Normocephalic.     Nose: Nose normal.     Mouth/Throat:     Mouth: Mucous membranes are moist.     Pharynx: Oropharynx is clear.  Eyes:     Conjunctiva/sclera: Conjunctivae normal.     Pupils: Pupils are equal, round, and reactive to light.  Cardiovascular:     Rate and Rhythm: Normal rate and regular rhythm.     Pulses: Normal pulses.     Heart sounds: Normal heart sounds.  Pulmonary:     Effort: Pulmonary effort is normal.     Breath sounds: Normal breath sounds.  Abdominal:     General: Abdomen is flat.     Palpations: Abdomen is soft.  Musculoskeletal:        General: Normal range of motion.     Cervical back: Normal range of motion.     Right lower leg: No edema.     Left lower leg: No edema.  Skin:    General: Skin is warm and dry.     Capillary Refill: Capillary refill takes less than 2 seconds.  Neurological:     General: No focal deficit present.     Mental Status: He is alert. Mental status is at baseline.  Psychiatric:        Mood and Affect: Mood normal.     Arrived for  home visit for [name] who was alert and oriented seated in the living room sweating reporting feeling tired. I obtained vitals and noted sugar was 95 which is low for [facility]. We were able to make him some lunch and bring it up. [name] stopped sweating and felt much better. I reviewed medications and filled pill box accordingly. [name] has been compliant with medications over the last week and reports feeling good with new medication regimen. I followed up with [employer] and devices are not covered under patient's insurance at this time. Patient is getting BCBS in January and we will reattempt to see eligibility for coverage.[name] agreed. Home visit complete. I will see [name] in one week.   Refills:  -Glipizide -Gabapentin     Future Appointments  Date Time Provider [place]  11/17/2020 11:30 AM Valente David, RN [facility] None  12/03/2020  7:40 AM [facility] DEVICE REMOTES [facility]  12/08/2020  1:15 PM [name], MD [facility]  12/14/2020 11:00 AM [facility] PA/NP [facility] None  01/19/2021  9:00 AM Azzie Glatter, FNP [facility] None  03/04/2021  7:40 AM [facility] DEVICE REMOTES [facility]  03/14/2021  9:00 AM Azzie Glatter, FNP [facility] None  06/03/2021  7:40 AM [facility] DEVICE REMOTES [facility]  09/02/2021  7:40 AM [facility] DEVICE REMOTES [facility]  12/02/2021  7:40 AM [facility] DEVICE REMOTES [facility]  03/03/2022  7:40 AM [facility] DEVICE REMOTES [facility]     ACTION: Home visit completed Next visit planned for one week

## 2020-11-09 NOTE — Telephone Encounter (Signed)
Oral surgery clearance faxed to [phone] [facility]

## 2020-11-10 MED FILL — GABAPENTIN 300 MG CAPSULE: 300 | 30 days supply | Qty: 60 | Fill #1

## 2020-11-10 MED FILL — glipiZIDE 10 MG TABS: 10 | 30 days supply | Qty: 60 | Fill #1

## 2020-11-15 NOTE — Telephone Encounter (Signed)
[name] notified device function WNL with no alerts. Notified that device does not vibrate in the chest.

## 2020-11-16 ENCOUNTER — Other Ambulatory Visit (HOSPITAL_COMMUNITY): Payer: Self-pay

## 2020-11-16 MED FILL — FUROSEMIDE 40 MG TAB: 40 | 30 days supply | Qty: 120 | Fill #4

## 2020-11-16 NOTE — Progress Notes (Signed)
Paramedicine Encounter    Patient ID: [name], male    DOB: 1961-08-08, 59 y.o.   MRN: [id number]   Patient Care Team: Azzie Glatter, FNP as PCP - General (Family Medicine) [name], MD as PCP - Cardiology (Cardiology) [name], MD as PCP - Advanced Heart Failure (Cardiology) [name], MD as PCP - Electrophysiology (Cardiology) Valente David, RN as [place] Management  Patient Active Problem List   Diagnosis Date Noted  . Acute blood loss anemia   . Angiodysplasia of stomach   . Chronic anticoagulation   . CHF exacerbation ([place]) 06/15/2020  . AKI (acute kidney injury) ([place]) 06/15/2020  . CKD (chronic kidney disease), stage III ([place]) 06/15/2020  . Anemia due to chronic blood loss   . Gastric AVM   . Angiodysplasia of small intestine ([place])   . Acute on chronic systolic (congestive) heart failure ([place]) 04/05/2020  . Anemia 04/05/2020  . PAF (paroxysmal atrial fibrillation) ([place]) 04/05/2020  . OSA on CPAP 04/05/2020  . Syncope and collapse 04/05/2020  . Type 2 diabetes mellitus with hyperglycemia, with long-term current use of insulin ([place]) 12/03/2019  . Diabetic polyneuropathy associated with type 2 diabetes mellitus ([place]) 12/03/2019  . Hyperglycemia 12/03/2019  . History of hyperglycemia 12/03/2019  . Erectile dysfunction 12/03/2019  . CHF (congestive heart failure) ([place]) 10/11/2019  . Paranoid schizophrenia, chronic condition ([place]) 01/26/2015  . Severe recurrent major depressive disorder with psychotic features ([place]) 01/26/2015  . GAD (generalized anxiety disorder) 01/26/2015  . OCD (obsessive compulsive disorder) 01/26/2015  . Panic disorder without agoraphobia 01/26/2015  . Insomnia 01/26/2015    Current Outpatient Medications:  .  acetaminophen (TYLENOL) 500 MG tablet, Take 2 tablets (1,000 mg total) by mouth in the morning and at bedtime., Disp: 180 tablet, Rfl: 3 .  apixaban (ELIQUIS) 5 MG TABS tablet, Take 1  tablet (5 mg total) by mouth 2 (two) times daily., Disp: 180 tablet, Rfl: 3 .  atorvastatin (LIPITOR) 40 MG tablet, Take 1 tablet (40 mg total) by mouth daily at 6 PM., Disp: 90 tablet, Rfl: 3 .  blood glucose meter kit and supplies KIT, Dispense based on patient and insurance preference. Use up to four times daily as directed. (FOR ICD-9 250.00, 250.01)., Disp: 1 each, Rfl: 0 .  buPROPion (WELLBUTRIN SR) 100 MG 12 hr tablet, Take 1 tablet (100 mg total) by mouth 2 (two) times daily., Disp: 60 tablet, Rfl: 6 .  carvedilol (COREG) 25 MG tablet, Take 1 tablet (25 mg total) by mouth 2 (two) times daily with a meal., Disp: 60 tablet, Rfl: 3 .  Continuous Blood Gluc Receiver (FREESTYLE LIBRE 14 DAY READER) DEVI, 1 each by Does not apply route as needed. (Patient not taking: Reported on 11/09/2020), Disp: 1 each, Rfl: 11 .  Continuous Blood Gluc Sensor (FREESTYLE LIBRE 14 DAY SENSOR) MISC, 1 each by Does not apply route as needed. (Patient not taking: Reported on 11/09/2020), Disp: 1 each, Rfl: 11 .  cyclobenzaprine (FLEXERIL) 10 MG tablet, Take 10 mg by mouth 3 (three) times daily as needed for muscle spasms.  (Patient not taking: Reported on 11/09/2020), Disp: , Rfl:  .  ferrous sulfate 325 (65 FE) MG tablet, Take 325 mg by mouth in the morning and at bedtime., Disp: , Rfl:  .  furosemide (LASIX) 40 MG tablet, Take 2 tablets (80 mg total) by mouth 2 (two) times daily., Disp: 360 tablet, Rfl: 3 .  gabapentin (NEURONTIN) 300 MG  capsule, Take 2 capsules (total = 600 mg), by mouth, 3 times a day. (Patient taking differently: Take 600 mg by mouth 2 (two) times daily.), Disp: 180 capsule, Rfl: 11 .  glipiZIDE (GLUCOTROL) 10 MG tablet, Take 1 tablet (10 mg total) by mouth 2 (two) times daily before a meal., Disp: 60 tablet, Rfl: 6 .  hydrALAZINE (APRESOLINE) 25 MG tablet, Take 1.5 tablets (37.5 mg total) by mouth 3 (three) times daily., Disp: 270 tablet, Rfl: 3 .  insulin glargine (LANTUS) 100 UNIT/ML injection,  Inject 0.2 mLs (20 Units total) into the skin daily., Disp: 30 mL, Rfl: 12 .  Insulin Lispro Prot & Lispro (HUMALOG MIX 75/25 KWIKPEN) (75-25) 100 UNIT/ML Kwikpen, Inject 30 Units into the skin 2 (two) times daily., Disp: 15 mL, Rfl: 11 .  Insulin Syringes, Disposable, U-100 0.3 ML MISC, 1 each by Does not apply route 3 (three) times daily., Disp: 300 each, Rfl: 3 .  isosorbide mononitrate (IMDUR) 30 MG 24 hr tablet, Take 0.5 tablets (15 mg total) by mouth daily., Disp: 45 tablet, Rfl: 3 .  metFORMIN (GLUCOPHAGE) 500 MG tablet, Take 1 tablet (500 mg total) by mouth 2 (two) times daily with a meal., Disp: 60 tablet, Rfl: 6 .  Multiple Vitamins-Minerals (CENTRUM SILVER 50+MEN) TABS, Take 1 tablet by mouth daily., Disp: , Rfl:  .  pantoprazole (PROTONIX) 40 MG tablet, TAKE 1 TABLET (40 MG TOTAL) BY MOUTH 2 (TWO) TIMES DAILY., Disp: 60 tablet, Rfl: 1 .  potassium chloride SA (KLOR-CON M20) 20 MEQ tablet, Take 1 tablet (20 mEq total) by mouth daily., Disp: 30 tablet, Rfl: 0 .  QUEtiapine (SEROQUEL) 200 MG tablet, Take 1 tablet (200 mg total) by mouth at bedtime., Disp: 30 tablet, Rfl: 6 .  sacubitril-valsartan (ENTRESTO) 97-103 MG, Take 1 tablet by mouth 2 (two) times daily., Disp: 60 tablet, Rfl: 5 .  sildenafil (VIAGRA) 100 MG tablet, Take 1 tablet (100 mg total) by mouth daily as needed for erectile dysfunction., Disp: 30 tablet, Rfl: 2 .  spironolactone (ALDACTONE) 25 MG tablet, Take 1 tablet (25 mg total) by mouth daily., Disp: 30 tablet, Rfl: 11 .  tetrahydrozoline 0.05 % ophthalmic solution, Place 1 drop into both eyes daily., Disp: , Rfl:  .  TRUEPLUS PEN NEEDLES 31G X 6 MM MISC, USE AS DIRECTED, Disp: 100 each, Rfl: 0 No Known Allergies   Social History   Socioeconomic History  . Marital status: Legally Separated    Spouse name: Not on file  . Number of children: Not on file  . Years of education: Not on file  . Highest education level: Not on file  Occupational History  . Not on file   Tobacco Use  . 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 Smokeless tobacco: Never Used  Vaping Use  . Vaping Use: Never used  Substance and Sexual Activity  . Alcohol use: Yes    Alcohol/week: 2.0 standard drinks    Types: 2 Cans of beer per week  . Drug use: No  . Sexual activity: Yes  Other Topics Concern  . Not on file  Social History Narrative  . Not on file   Social Determinants of Health   Financial Resource Strain: Low Risk   . Difficulty of Paying Living Expenses: Not very hard  Food Insecurity: No Food Insecurity  . Worried About Charity fundraiser in the Last Year: Never true  . Ran Out of Food in the Last Year: Never true  Transportation Needs: Unmet  Transportation Needs  . Lack of Transportation (Medical): Yes  . Lack of Transportation (Non-Medical): Yes  Physical Activity: Not on file  Stress: Not on file  Social Connections: Not on file  Intimate Partner Violence: Not on file    Physical Exam Vitals reviewed.  Constitutional:      Appearance: He is normal weight. He is not toxic-appearing.  HENT:     Head: Normocephalic.     Nose: Nose normal.     Mouth/Throat:     Mouth: Mucous membranes are moist.     Pharynx: Oropharynx is clear.  Eyes:     Pupils: Pupils are equal, round, and reactive to light.  Cardiovascular:     Rate and Rhythm: Normal rate and regular rhythm.     Pulses: Normal pulses.     Heart sounds: Normal heart sounds.  Pulmonary:     Effort: Pulmonary effort is normal.     Breath sounds: Normal breath sounds.  Abdominal:     Palpations: Abdomen is soft.  Musculoskeletal:        General: Normal range of motion.     Cervical back: Normal range of motion.     Right lower leg: No edema.     Left lower leg: No edema.  Skin:    General: Skin is warm and dry.     Capillary Refill: Capillary refill takes less than 2 seconds.  Neurological:     General: No focal deficit present.     Mental Status: He is alert. Mental status is at baseline.   Psychiatric:        Mood and Affect: Mood normal.     Arrived for home visit for [name] who was alert and oriented seated in his living room. [name] reports having a headache and having some extra stress lately. We discussed this and we are working through some financial assistance issues he is needed regarding his SSI check and payee assistance. [name] felt reassured after speaking about these things. We will continue to assist.  [name] had no complaints of chest pain, dizziness, shortness of breath. No swelling noted. Lung sounds clear. Vitals were obtained and are as noted in report. Medications were reviewed and confirmed. Pill box filled accordingly. [name] received application from [employer], we will fill out and fax. Appointments confirmed and reviewed. Home visit complete. I will see [name] in one week.   Refills:  Lasix     Future Appointments  Date Time Provider [place]  11/17/2020 11:30 AM Valente David, RN [facility] None  12/03/2020  7:40 AM [facility] DEVICE REMOTES [facility]  12/08/2020  1:15 PM [name], MD [facility]  12/14/2020 11:00 AM [facility] PA/NP [facility] None  01/19/2021  9:00 AM Azzie Glatter, FNP [facility] None  03/04/2021  7:40 AM [facility] DEVICE REMOTES [facility]  03/14/2021  9:00 AM Azzie Glatter, FNP [facility] None  06/03/2021  7:40 AM [facility] DEVICE REMOTES [facility]  09/02/2021  7:40 AM [facility] DEVICE REMOTES [facility]  12/02/2021  7:40 AM [facility] DEVICE REMOTES [facility]  03/03/2022  7:40 AM [facility] DEVICE REMOTES [facility]     ACTION: Home visit completed Next visit planned for one week

## 2020-11-17 ENCOUNTER — Other Ambulatory Visit: Payer: Self-pay | Admitting: *Deleted

## 2020-11-17 NOTE — Patient Outreach (Signed)
[facility]) Care Management  Jolly  11/17/2020   [name] 31-Oct-1961 [id number]  Outgoing call placed to member to follow up on management of DM and HF.  State he has been doing well, blood sugars remain slightly elevated around 200, but admit that he has not been consistent with his diet.  Weight has remained stable around 315-320 pounds, denies shortness of breath or swelling.  Report [name], EMT with paramedicine was in the home yesterday, will return next week.  Discussed adhering to plan of care especially with the holiday approaching, state he will do his best.  Has follow up appointments with cardiology on 1/12, heart failure clinic on 1/18, and PCP on 2/23.  Denies any urgent concerns, encouraged to contact this care manager with questions.    Encounter Medications:  Outpatient Encounter Medications as of 11/17/2020  Medication Sig  . acetaminophen (TYLENOL) 500 MG tablet Take 2 tablets (1,000 mg total) by mouth in the morning and at bedtime.  [name] Kitchen apixaban (ELIQUIS) 5 MG TABS tablet Take 1 tablet (5 mg total) by mouth 2 (two) times daily.  [name] Kitchen atorvastatin (LIPITOR) 40 MG tablet Take 1 tablet (40 mg total) by mouth daily at 6 PM.  . blood glucose meter kit and supplies KIT Dispense based on patient and insurance preference. Use up to four times daily as directed. (FOR ICD-9 250.00, 250.01).  [name] Kitchen buPROPion (WELLBUTRIN SR) 100 MG 12 hr tablet Take 1 tablet (100 mg total) by mouth 2 (two) times daily.  . carvedilol (COREG) 25 MG tablet Take 1 tablet (25 mg total) by mouth 2 (two) times daily with a meal.  . Continuous Blood Gluc Receiver (FREESTYLE LIBRE 14 DAY READER) DEVI 1 each by Does not apply route as needed. (Patient not taking: No sig reported)  . Continuous Blood Gluc Sensor (FREESTYLE LIBRE 14 DAY SENSOR) MISC 1 each by Does not apply route as needed. (Patient not taking: No sig reported)  . cyclobenzaprine (FLEXERIL) 10 MG tablet Take 10 mg  by mouth 3 (three) times daily as needed for muscle spasms.  (Patient not taking: No sig reported)  . ferrous sulfate 325 (65 FE) MG tablet Take 325 mg by mouth in the morning and at bedtime.  . furosemide (LASIX) 40 MG tablet Take 2 tablets (80 mg total) by mouth 2 (two) times daily.  [name] Kitchen gabapentin (NEURONTIN) 300 MG capsule Take 2 capsules (total = 600 mg), by mouth, 3 times a day. (Patient taking differently: Take 600 mg by mouth 2 (two) times daily.)  . glipiZIDE (GLUCOTROL) 10 MG tablet Take 1 tablet (10 mg total) by mouth 2 (two) times daily before a meal.  . hydrALAZINE (APRESOLINE) 25 MG tablet Take 1.5 tablets (37.5 mg total) by mouth 3 (three) times daily.  . insulin glargine (LANTUS) 100 UNIT/ML injection Inject 0.2 mLs (20 Units total) into the skin daily.  . Insulin Lispro Prot & Lispro (HUMALOG MIX 75/25 KWIKPEN) (75-25) 100 UNIT/ML Kwikpen Inject 30 Units into the skin 2 (two) times daily.  . Insulin Syringes, Disposable, U-100 0.3 ML MISC 1 each by Does not apply route 3 (three) times daily.  . isosorbide mononitrate (IMDUR) 30 MG 24 hr tablet Take 0.5 tablets (15 mg total) by mouth daily.  . metFORMIN (GLUCOPHAGE) 500 MG tablet Take 1 tablet (500 mg total) by mouth 2 (two) times daily with a meal.  . Multiple Vitamins-Minerals (CENTRUM SILVER 50+MEN) TABS Take 1 tablet by mouth daily.  . pantoprazole (  PROTONIX) 40 MG tablet TAKE 1 TABLET (40 MG TOTAL) BY MOUTH 2 (TWO) TIMES DAILY.  [name] Kitchen potassium chloride SA (KLOR-CON M20) 20 MEQ tablet Take 1 tablet (20 mEq total) by mouth daily.  . QUEtiapine (SEROQUEL) 200 MG tablet Take 1 tablet (200 mg total) by mouth at bedtime.  . sacubitril-valsartan (ENTRESTO) 97-103 MG Take 1 tablet by mouth 2 (two) times daily.  . sildenafil (VIAGRA) 100 MG tablet Take 1 tablet (100 mg total) by mouth daily as needed for erectile dysfunction. (Patient not taking: Reported on 11/16/2020)  . spironolactone (ALDACTONE) 25 MG tablet Take 1 tablet (25 mg total) by  mouth daily.  [name] Kitchen tetrahydrozoline 0.05 % ophthalmic solution Place 1 drop into both eyes daily.  . TRUEPLUS PEN NEEDLES 31G X 6 MM MISC USE AS DIRECTED   No facility-administered encounter medications on file as of 11/17/2020.    Functional Status:  In your present state of health, do you have any difficulty performing the following activities: 06/16/2020 06/16/2020  Hearing? - N  Vision? - N  Difficulty concentrating or making decisions? - N  Walking or climbing stairs? - N  Dressing or bathing? - N  Doing errands, shopping? Y -  Some recent data might be hidden    Fall/Depression Screening: Fall Risk  09/13/2020 07/19/2020 04/16/2020  Falls in the past year? 0 1 0  Comment - - not since last hospital visit  Number falls in past yr: 1 0 0  Injury with Fall? 0 0 0  Risk for fall due to : No Fall Risks - -   PHQ 2/9 Scores 09/13/2020 07/19/2020 07/06/2020 04/16/2020 01/12/2020  PHQ - 2 Score 0 0 0 0 0  Exception Documentation Medical reason Medical reason - - -    Assessment:  Goals Addressed            This Visit's Progress   . [facility] - Make and Keep All Appointments   On track    Follow Up Date 12/14/2020  Timeframe:  Short-Term Goal Priority:  Medium Start Date:    11/17/2020                         Expected End Date:       12/18/2020                  - ask family or friend for a ride - call to cancel if needed - keep a calendar with appointment dates    Why is this important?   Part of staying healthy is seeing the doctor for follow-up care.  If you forget your appointments, there are some things you can do to stay on track.    Notes:   11/24 - Upcoming appointments reviewed, encouraged to keep all dates  12/22 - Reminded of importance of attending all MD appointments     . [facility] - Track and Manage Activity and Exertion   On track    Follow Up Date 12/14/2020  Timeframe:  Long-Range Goal Priority:  High Start Date:     11/17/2020                        Expected  End Date:       01/18/2021                  - drink water to stay hydrated during exercise - follow activity or exercise plan - make an  activity or exercise plan - pace activity allowing for rest - track symptoms during activity in diary    Why is this important?   Exercising is very important when managing your heart failure.  It will help your heart get stronger.    Notes:   11/24 - Activity levels reviewed, encouraged to continue to increase as tolerated.  Daily weights reviewed  12/22 - Reminded to monitor weight, BP, and CBG daily and record trends        Plan:  Follow-up:  Patient agrees to Care Plan and Follow-up. Will send new education regarding HF and CM management as well as diet management.  Will follow up within the next month.  Valente David, [place], MSN [place] [phone]

## 2020-11-17 NOTE — Patient Instructions (Signed)
Diabetes Mellitus and Nutrition, Adult When you have diabetes (diabetes mellitus), it is very important to have healthy eating habits because your blood sugar (glucose) levels are greatly affected by what you eat and drink. Eating healthy foods in the appropriate amounts, at about the same times every day, can help you:  Control your blood glucose.  Lower your risk of heart disease.  Improve your blood pressure.  Reach or maintain a healthy weight. Every person with diabetes is different, and each person has different needs for a meal plan. Your health care provider may recommend that you work with a diet and nutrition specialist (dietitian) to make a meal plan that is best for you. Your meal plan may vary depending on factors such as:  The calories you need.  The medicines you take.  Your weight.  Your blood glucose, blood pressure, and cholesterol levels.  Your activity level.  Other health conditions you have, such as heart or kidney disease. How do carbohydrates affect me? Carbohydrates, also called carbs, affect your blood glucose level more than any other type of food. Eating carbs naturally raises the amount of glucose in your blood. Carb counting is a method for keeping track of how many carbs you eat. Counting carbs is important to keep your blood glucose at a healthy level, especially if you use insulin or take certain oral diabetes medicines. It is important to know how many carbs you can safely have in each meal. This is different for every person. Your dietitian can help you calculate how many carbs you should have at each meal and for each snack. Foods that contain carbs include:  Bread, cereal, rice, pasta, and crackers.  Potatoes and corn.  Peas, beans, and lentils.  Milk and yogurt.  Fruit and juice.  Desserts, such as cakes, cookies, ice cream, and candy. How does alcohol affect me? Alcohol can cause a sudden decrease in blood glucose (hypoglycemia),  especially if you use insulin or take certain oral diabetes medicines. Hypoglycemia can be a life-threatening condition. Symptoms of hypoglycemia (sleepiness, dizziness, and confusion) are similar to symptoms of having too much alcohol. If your health care provider says that alcohol is safe for you, follow these guidelines:  Limit alcohol intake to no more than 1 drink per day for nonpregnant women and 2 drinks per day for men. One drink equals 12 oz of beer, 5 oz of wine, or 1 oz of hard liquor.  Do not drink on an empty stomach.  Keep yourself hydrated with water, diet soda, or unsweetened iced tea.  Keep in mind that regular soda, juice, and other mixers may contain a lot of sugar and must be counted as carbs. What are tips for following this plan?  Reading food labels  Start by checking the serving size on the "Nutrition Facts" label of packaged foods and drinks. The amount of calories, carbs, fats, and other nutrients listed on the label is based on one serving of the item. Many items contain more than one serving per package.  Check the total grams (g) of carbs in one serving. You can calculate the number of servings of carbs in one serving by dividing the total carbs by 15. For example, if a food has 30 g of total carbs, it would be equal to 2 servings of carbs.  Check the number of grams (g) of saturated and trans fats in one serving. Choose foods that have low or no amount of these fats.  Check the number of   milligrams (mg) of salt (sodium) in one serving. Most people should limit total sodium intake to less than 2,300 mg per day.  Always check the nutrition information of foods labeled as "low-fat" or "nonfat". These foods may be higher in added sugar or refined carbs and should be avoided.  Talk to your dietitian to identify your daily goals for nutrients listed on the label. Shopping  Avoid buying canned, premade, or processed foods. These foods tend to be high in fat, sodium,  and added sugar.  Shop around the outside edge of the grocery store. This includes fresh fruits and vegetables, bulk grains, fresh meats, and fresh dairy. Cooking  Use low-heat cooking methods, such as baking, instead of high-heat cooking methods like deep frying.  Cook using healthy oils, such as olive, canola, or sunflower oil.  Avoid cooking with butter, cream, or high-fat meats. Meal planning  Eat meals and snacks regularly, preferably at the same times every day. Avoid going long periods of time without eating.  Eat foods high in fiber, such as fresh fruits, vegetables, beans, and whole grains. Talk to your dietitian about how many servings of carbs you can eat at each meal.  Eat 4-6 ounces (oz) of lean protein each day, such as lean meat, chicken, fish, eggs, or tofu. One oz of lean protein is equal to: ? 1 oz of meat, chicken, or fish. ? 1 egg. ?  cup of tofu.  Eat some foods each day that contain healthy fats, such as avocado, nuts, seeds, and fish. Lifestyle  Check your blood glucose regularly.  Exercise regularly as told by your health care provider. This may include: ? 150 minutes of moderate-intensity or vigorous-intensity exercise each week. This could be brisk walking, biking, or water aerobics. ? Stretching and doing strength exercises, such as yoga or weightlifting, at least 2 times a week.  Take medicines as told by your health care provider.  Do not use any products that contain nicotine or tobacco, such as cigarettes and e-cigarettes. If you need help quitting, ask your health care provider.  Work with a Social worker or diabetes educator to identify strategies to manage stress and any emotional and social challenges. Questions to ask a health care provider  Do I need to meet with a diabetes educator?  Do I need to meet with a dietitian?  What number can I call if I have questions?  When are the best times to check my blood glucose? Where to find more  information:  American Diabetes Association: diabetes.org  Academy of Nutrition and Dietetics: www.eatright.[employer] of Diabetes and Digestive and Kidney Diseases (NIH): [employer] Summary  A healthy meal plan will help you control your blood glucose and maintain a healthy lifestyle.  Working with a diet and nutrition specialist (dietitian) can help you make a meal plan that is best for you.  Keep in mind that carbohydrates (carbs) and alcohol have immediate effects on your blood glucose levels. It is important to count carbs and to use alcohol carefully. This information is not intended to replace advice given to you by your health care provider. Make sure you discuss any questions you have with your health care provider. Document Revised: 10/26/2017 Document Reviewed: 12/18/2016 Elsevier Patient Education  [place]. Low-Sodium Eating Plan Sodium, which is an element that makes up salt, helps you maintain a healthy balance of fluids in your body. Too much sodium can increase your blood pressure and cause fluid and waste to be held  in your body. Your health care provider or dietitian may recommend following this plan if you have high blood pressure (hypertension), kidney disease, liver disease, or heart failure. Eating less sodium can help lower your blood pressure, reduce swelling, and protect your heart, liver, and kidneys. What are tips for following this plan? General guidelines  Most people on this plan should limit their sodium intake to 1,500-2,000 mg (milligrams) of sodium each day. Reading food labels   The Nutrition Facts label lists the amount of sodium in one serving of the food. If you eat more than one serving, you must multiply the listed amount of sodium by the number of servings.  Choose foods with less than 140 mg of sodium per serving.  Avoid foods with 300 mg of sodium or more per serving. Shopping  Look for lower-sodium products,  often labeled as "low-sodium" or "no salt added."  Always check the sodium content even if foods are labeled as "unsalted" or "no salt added".  Buy fresh foods. ? Avoid canned foods and premade or frozen meals. ? Avoid canned, cured, or processed meats  Buy breads that have less than 80 mg of sodium per slice. Cooking  Eat more home-cooked food and less restaurant, buffet, and fast food.  Avoid adding salt when cooking. Use salt-free seasonings or herbs instead of table salt or sea salt. Check with your health care provider or pharmacist before using salt substitutes.  Cook with plant-based oils, such as canola, sunflower, or olive oil. Meal planning  When eating at a restaurant, ask that your food be prepared with less salt or no salt, if possible.  Avoid foods that contain MSG (monosodium glutamate). MSG is sometimes added to [place] food, bouillon, and some canned foods. What foods are recommended? The items listed may not be a complete list. Talk with your dietitian about what dietary choices are best for you. Grains Low-sodium cereals, including oats, puffed wheat and rice, and shredded wheat. Low-sodium crackers. Unsalted rice. Unsalted pasta. Low-sodium bread. Whole-grain breads and whole-grain pasta. Vegetables Fresh or frozen vegetables. "No salt added" canned vegetables. "No salt added" tomato sauce and paste. Low-sodium or reduced-sodium tomato and vegetable juice. Fruits Fresh, frozen, or canned fruit. Fruit juice. Meats and other protein foods Fresh or frozen (no salt added) meat, poultry, seafood, and fish. Low-sodium canned tuna and salmon. Unsalted nuts. Dried peas, beans, and lentils without added salt. Unsalted canned beans. Eggs. Unsalted nut butters. Dairy Milk. Soy milk. Cheese that is naturally low in sodium, such as ricotta cheese, fresh mozzarella, or Swiss cheese Low-sodium or reduced-sodium cheese. Cream cheese. Yogurt. Fats and oils Unsalted butter.  Unsalted margarine with no trans fat. Vegetable oils such as canola or olive oils. Seasonings and other foods Fresh and dried herbs and spices. Salt-free seasonings. Low-sodium mustard and ketchup. Sodium-free salad dressing. Sodium-free light mayonnaise. Fresh or refrigerated horseradish. Lemon juice. Vinegar. Homemade, reduced-sodium, or low-sodium soups. Unsalted popcorn and pretzels. Low-salt or salt-free chips. What foods are not recommended? The items listed may not be a complete list. Talk with your dietitian about what dietary choices are best for you. Grains Instant hot cereals. Bread stuffing, pancake, and biscuit mixes. Croutons. Seasoned rice or pasta mixes. Noodle soup cups. Boxed or frozen macaroni and cheese. Regular salted crackers. Self-rising flour. Vegetables Sauerkraut, pickled vegetables, and relishes. Olives. [place] fries. Onion rings. Regular canned vegetables (not low-sodium or reduced-sodium). Regular canned tomato sauce and paste (not low-sodium or reduced-sodium). Regular tomato and vegetable juice (not low-sodium  or reduced-sodium). Frozen vegetables in sauces. Meats and other protein foods Meat or fish that is salted, canned, smoked, spiced, or pickled. Bacon, ham, sausage, hotdogs, corned beef, chipped beef, packaged lunch meats, salt pork, jerky, pickled herring, anchovies, regular canned tuna, sardines, salted nuts. Dairy Processed cheese and cheese spreads. Cheese curds. Blue cheese. Feta cheese. String cheese. Regular cottage cheese. Buttermilk. Canned milk. Fats and oils Salted butter. Regular margarine. Ghee. Bacon fat. Seasonings and other foods Onion salt, garlic salt, seasoned salt, table salt, and sea salt. Canned and packaged gravies. Worcestershire sauce. Tartar sauce. Barbecue sauce. Teriyaki sauce. Soy sauce, including reduced-sodium. Steak sauce. Fish sauce. Oyster sauce. Cocktail sauce. Horseradish that you find on the shelf. Regular ketchup and mustard.  Meat flavorings and tenderizers. Bouillon cubes. Hot sauce and Tabasco sauce. Premade or packaged marinades. Premade or packaged taco seasonings. Relishes. Regular salad dressings. Salsa. Potato and tortilla chips. Corn chips and puffs. Salted popcorn and pretzels. Canned or dried soups. Pizza. Frozen entrees and pot pies. Summary  Eating less sodium can help lower your blood pressure, reduce swelling, and protect your heart, liver, and kidneys.  Most people on this plan should limit their sodium intake to 1,500-2,000 mg (milligrams) of sodium each day.  Canned, boxed, and frozen foods are high in sodium. Restaurant foods, fast foods, and pizza are also very high in sodium. You also get sodium by adding salt to food.  Try to cook at home, eat more fresh fruits and vegetables, and eat less fast food, canned, processed, or prepared foods. This information is not intended to replace advice given to you by your health care provider. Make sure you discuss any questions you have with your health care provider. Document Revised: 10/26/2017 Document Reviewed: 11/06/2016 Elsevier Patient Education  2020 [place] Addressed            This Visit's Progress   . [facility] - Make and Keep All Appointments   On track    Follow Up Date 12/14/2020  Timeframe:  Short-Term Goal Priority:  Medium Start Date:    11/17/2020                         Expected End Date:       12/18/2020                  - ask family or friend for a ride - call to cancel if needed - keep a calendar with appointment dates    Why is this important?   Part of staying healthy is seeing the doctor for follow-up care.  If you forget your appointments, there are some things you can do to stay on track.    Notes:   11/24 - Upcoming appointments reviewed, encouraged to keep all dates  12/22 - Reminded of importance of attending all MD appointments     . [facility] - Track and Manage Activity and Exertion   On track     Follow Up Date 12/14/2020  Timeframe:  Long-Range Goal Priority:  High Start Date:     11/17/2020                        Expected End Date:       01/18/2021                  - drink water to stay hydrated during exercise - follow activity or exercise plan -  make an activity or exercise plan - pace activity allowing for rest - track symptoms during activity in diary    Why is this important?   Exercising is very important when managing your heart failure.  It will help your heart get stronger.    Notes:   11/24 - Activity levels reviewed, encouraged to continue to increase as tolerated.  Daily weights reviewed  12/22 - Reminded to monitor weight, BP, and CBG daily and record trends

## 2020-11-23 ENCOUNTER — Other Ambulatory Visit (HOSPITAL_COMMUNITY): Payer: Self-pay

## 2020-11-23 NOTE — Progress Notes (Signed)
Paramedicine Encounter    Patient ID: [name], male    DOB: 12/21/1960, 59 y.o.   MRN: [id number]   Patient Care Team: Azzie Glatter, FNP as PCP - General (Family Medicine) [name], MD as PCP - Cardiology (Cardiology) [name], MD as PCP - Advanced Heart Failure (Cardiology) [name], MD as PCP - Electrophysiology (Cardiology) Valente David, RN as [facility]  Patient Active Problem List   Diagnosis Date Noted  . Acute blood loss anemia   . Angiodysplasia of stomach   . Chronic anticoagulation   . CHF exacerbation ([place]) 06/15/2020  . AKI (acute kidney injury) ([place]) 06/15/2020  . CKD (chronic kidney disease), stage III ([place]) 06/15/2020  . Anemia due to chronic blood loss   . Gastric AVM   . Angiodysplasia of small intestine ([place])   . Acute on chronic systolic (congestive) heart failure ([place]) 04/05/2020  . Anemia 04/05/2020  . PAF (paroxysmal atrial fibrillation) ([place]) 04/05/2020  . OSA on CPAP 04/05/2020  . Syncope and collapse 04/05/2020  . Type 2 diabetes mellitus with hyperglycemia, with long-term current use of insulin ([place]) 12/03/2019  . Diabetic polyneuropathy associated with type 2 diabetes mellitus ([place]) 12/03/2019  . Hyperglycemia 12/03/2019  . History of hyperglycemia 12/03/2019  . Erectile dysfunction 12/03/2019  . CHF (congestive heart failure) ([place]) 10/11/2019  . Paranoid schizophrenia, chronic condition ([place]) 01/26/2015  . Severe recurrent major depressive disorder with psychotic features ([place]) 01/26/2015  . GAD (generalized anxiety disorder) 01/26/2015  . OCD (obsessive compulsive disorder) 01/26/2015  . Panic disorder without agoraphobia 01/26/2015  . Insomnia 01/26/2015    Current Outpatient Medications:  .  acetaminophen (TYLENOL) 500 MG tablet, Take 2 tablets (1,000 mg total) by mouth in the morning and at bedtime., Disp: 180 tablet, Rfl: 3 .  apixaban (ELIQUIS) 5 MG TABS tablet, Take 1  tablet (5 mg total) by mouth 2 (two) times daily., Disp: 180 tablet, Rfl: 3 .  atorvastatin (LIPITOR) 40 MG tablet, Take 1 tablet (40 mg total) by mouth daily at 6 PM., Disp: 90 tablet, Rfl: 3 .  blood glucose meter kit and supplies KIT, Dispense based on patient and insurance preference. Use up to four times daily as directed. (FOR ICD-9 250.00, 250.01)., Disp: 1 each, Rfl: 0 .  buPROPion (WELLBUTRIN SR) 100 MG 12 hr tablet, Take 1 tablet (100 mg total) by mouth 2 (two) times daily., Disp: 60 tablet, Rfl: 6 .  carvedilol (COREG) 25 MG tablet, Take 1 tablet (25 mg total) by mouth 2 (two) times daily with a meal., Disp: 60 tablet, Rfl: 3 .  Continuous Blood Gluc Receiver (FREESTYLE LIBRE 14 DAY READER) DEVI, 1 each by Does not apply route as needed. (Patient not taking: No sig reported), Disp: 1 each, Rfl: 11 .  Continuous Blood Gluc Sensor (FREESTYLE LIBRE 14 DAY SENSOR) MISC, 1 each by Does not apply route as needed. (Patient not taking: No sig reported), Disp: 1 each, Rfl: 11 .  cyclobenzaprine (FLEXERIL) 10 MG tablet, Take 10 mg by mouth 3 (three) times daily as needed for muscle spasms.  (Patient not taking: No sig reported), Disp: , Rfl:  .  ferrous sulfate 325 (65 FE) MG tablet, Take 325 mg by mouth in the morning and at bedtime., Disp: , Rfl:  .  furosemide (LASIX) 40 MG tablet, Take 2 tablets (80 mg total) by mouth 2 (two) times daily., Disp: 360 tablet, Rfl: 3 .  gabapentin (NEURONTIN) 300 MG  capsule, Take 2 capsules (total = 600 mg), by mouth, 3 times a day. (Patient taking differently: Take 600 mg by mouth 2 (two) times daily.), Disp: 180 capsule, Rfl: 11 .  glipiZIDE (GLUCOTROL) 10 MG tablet, Take 1 tablet (10 mg total) by mouth 2 (two) times daily before a meal., Disp: 60 tablet, Rfl: 6 .  hydrALAZINE (APRESOLINE) 25 MG tablet, Take 1.5 tablets (37.5 mg total) by mouth 3 (three) times daily., Disp: 270 tablet, Rfl: 3 .  insulin glargine (LANTUS) 100 UNIT/ML injection, Inject 0.2 mLs (20  Units total) into the skin daily., Disp: 30 mL, Rfl: 12 .  Insulin Lispro Prot & Lispro (HUMALOG MIX 75/25 KWIKPEN) (75-25) 100 UNIT/ML Kwikpen, Inject 30 Units into the skin 2 (two) times daily., Disp: 15 mL, Rfl: 11 .  Insulin Syringes, Disposable, U-100 0.3 ML MISC, 1 each by Does not apply route 3 (three) times daily., Disp: 300 each, Rfl: 3 .  isosorbide mononitrate (IMDUR) 30 MG 24 hr tablet, Take 0.5 tablets (15 mg total) by mouth daily., Disp: 45 tablet, Rfl: 3 .  metFORMIN (GLUCOPHAGE) 500 MG tablet, Take 1 tablet (500 mg total) by mouth 2 (two) times daily with a meal., Disp: 60 tablet, Rfl: 6 .  Multiple Vitamins-Minerals (CENTRUM SILVER 50+MEN) TABS, Take 1 tablet by mouth daily., Disp: , Rfl:  .  pantoprazole (PROTONIX) 40 MG tablet, TAKE 1 TABLET (40 MG TOTAL) BY MOUTH 2 (TWO) TIMES DAILY., Disp: 60 tablet, Rfl: 1 .  potassium chloride SA (KLOR-CON M20) 20 MEQ tablet, Take 1 tablet (20 mEq total) by mouth daily., Disp: 30 tablet, Rfl: 0 .  QUEtiapine (SEROQUEL) 200 MG tablet, Take 1 tablet (200 mg total) by mouth at bedtime., Disp: 30 tablet, Rfl: 6 .  sacubitril-valsartan (ENTRESTO) 97-103 MG, Take 1 tablet by mouth 2 (two) times daily., Disp: 60 tablet, Rfl: 5 .  sildenafil (VIAGRA) 100 MG tablet, Take 1 tablet (100 mg total) by mouth daily as needed for erectile dysfunction. (Patient not taking: Reported on 11/16/2020), Disp: 30 tablet, Rfl: 2 .  spironolactone (ALDACTONE) 25 MG tablet, Take 1 tablet (25 mg total) by mouth daily., Disp: 30 tablet, Rfl: 11 .  tetrahydrozoline 0.05 % ophthalmic solution, Place 1 drop into both eyes daily., Disp: , Rfl:  .  TRUEPLUS PEN NEEDLES 31G X 6 MM MISC, USE AS DIRECTED, Disp: 100 each, Rfl: 0 No Known Allergies   Social History   Socioeconomic History  . Marital status: Legally Separated    Spouse name: Not on file  . Number of children: Not on file  . Years of education: Not on file  . Highest education level: Not on file  Occupational  History  . Not on file  Tobacco Use  . 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 Smokeless tobacco: Never Used  Vaping Use  . Vaping Use: Never used  Substance and Sexual Activity  . Alcohol use: Yes    Alcohol/week: 2.0 standard drinks    Types: 2 Cans of beer per week  . Drug use: No  . Sexual activity: Yes  Other Topics Concern  . Not on file  Social History Narrative  . Not on file   Social Determinants of Health   Financial Resource Strain: Low Risk   . Difficulty of Paying Living Expenses: Not very hard  Food Insecurity: No Food Insecurity  . Worried About Charity fundraiser in the Last Year: Never true  . Ran Out of Food in the Last Year:  Never true  Transportation Needs: Unmet Transportation Needs  . Lack of Transportation (Medical): Yes  . Lack of Transportation (Non-Medical): Yes  Physical Activity: Not on file  Stress: Not on file  Social Connections: Not on file  Intimate Partner Violence: Not on file    Physical Exam Vitals reviewed.  Constitutional:      Appearance: He is normal weight.  HENT:     Head: Normocephalic.     Nose: Nose normal.     Mouth/Throat:     Mouth: Mucous membranes are moist.     Pharynx: Oropharynx is clear.  Eyes:     Pupils: Pupils are equal, round, and reactive to light.  Cardiovascular:     Rate and Rhythm: Normal rate and regular rhythm.     Pulses: Normal pulses.     Heart sounds: Normal heart sounds.  Pulmonary:     Effort: Pulmonary effort is normal.     Breath sounds: Normal breath sounds.  Abdominal:     Palpations: Abdomen is soft.  Musculoskeletal:        General: Normal range of motion.     Cervical back: Normal range of motion.     Right lower leg: No edema.     Left lower leg: No edema.  Skin:    General: Skin is warm and dry.     Capillary Refill: Capillary refill takes less than 2 seconds.  Neurological:     General: No focal deficit present.     Mental Status: He is alert. Mental status is at baseline.   Psychiatric:        Mood and Affect: Mood normal.     Arrived for home visit for [name] who was alert and oriented reporting he was feeling okay just tired because he went to a party last night and didn't get home until 0400. [name] denied any other complaints. [name] was compliant with his medications for the exception of 3 evening doses. Medications were reviewed and confirmed. Pill box filled accordingly. Vitals were obtained and are as noted. [name] had just taken his medications prior to my arrival. [name] and I reviewed appointments and reviewed letters from [facility] I will take letters and forms to [place] social work once completed by patient and his sister at the patient's request. No swelling noted. Lung sounds clear. Home visit complete. I will see [name] in one week.   Refills:  Atorvastatin     Future Appointments  Date Time Provider [place]  12/03/2020  7:40 AM [facility] DEVICE REMOTES [facility]  12/08/2020  1:15 PM [name], MD [facility]  12/13/2020 10:30 AM Valente David, RN [facility] None  12/14/2020 11:00 AM [facility] PA/NP [facility] None  01/19/2021  9:00 AM Azzie Glatter, FNP [facility] None  03/04/2021  7:40 AM [facility] DEVICE REMOTES [facility]  03/14/2021  9:00 AM Azzie Glatter, FNP [facility] None  06/03/2021  7:40 AM [facility] DEVICE REMOTES [facility]  09/02/2021  7:40 AM [facility] DEVICE REMOTES [facility]  12/02/2021  7:40 AM [facility] DEVICE REMOTES [facility]  03/03/2022  7:40 AM [facility] DEVICE REMOTES [facility]     ACTION: Home visit completed Next visit planned for one week

## 2020-11-30 ENCOUNTER — Other Ambulatory Visit (HOSPITAL_COMMUNITY): Payer: Self-pay

## 2020-11-30 MED FILL — ATORVASTATIN CALCIUM 40 MG: 40 | 90 days supply | Qty: 90 | Fill #2

## 2020-11-30 NOTE — Progress Notes (Signed)
Paramedicine Encounter    Patient ID: [name], male    DOB: 12/21/1960, 60 y.o.   MRN: [id number]   Patient Care Team: Azzie Glatter, FNP as PCP - General (Family Medicine) [name], MD as PCP - Cardiology (Cardiology) [name], MD as PCP - Advanced Heart Failure (Cardiology) [name], MD as PCP - Electrophysiology (Cardiology) Valente David, RN as [facility]  Patient Active Problem List   Diagnosis Date Noted  . Acute blood loss anemia   . Angiodysplasia of stomach   . Chronic anticoagulation   . CHF exacerbation ([place]) 06/15/2020  . AKI (acute kidney injury) ([place]) 06/15/2020  . CKD (chronic kidney disease), stage III ([place]) 06/15/2020  . Anemia due to chronic blood loss   . Gastric AVM   . Angiodysplasia of small intestine ([place])   . Acute on chronic systolic (congestive) heart failure ([place]) 04/05/2020  . Anemia 04/05/2020  . PAF (paroxysmal atrial fibrillation) ([place]) 04/05/2020  . OSA on CPAP 04/05/2020  . Syncope and collapse 04/05/2020  . Type 2 diabetes mellitus with hyperglycemia, with long-term current use of insulin ([place]) 12/03/2019  . Diabetic polyneuropathy associated with type 2 diabetes mellitus ([place]) 12/03/2019  . Hyperglycemia 12/03/2019  . History of hyperglycemia 12/03/2019  . Erectile dysfunction 12/03/2019  . CHF (congestive heart failure) ([place]) 10/11/2019  . Paranoid schizophrenia, chronic condition ([place]) 01/26/2015  . Severe recurrent major depressive disorder with psychotic features ([place]) 01/26/2015  . GAD (generalized anxiety disorder) 01/26/2015  . OCD (obsessive compulsive disorder) 01/26/2015  . Panic disorder without agoraphobia 01/26/2015  . Insomnia 01/26/2015    Current Outpatient Medications:  .  acetaminophen (TYLENOL) 500 MG tablet, Take 2 tablets (1,000 mg total) by mouth in the morning and at bedtime., Disp: 180 tablet, Rfl: 3 .  apixaban (ELIQUIS) 5 MG TABS tablet, Take 1  tablet (5 mg total) by mouth 2 (two) times daily., Disp: 180 tablet, Rfl: 3 .  atorvastatin (LIPITOR) 40 MG tablet, Take 1 tablet (40 mg total) by mouth daily at 6 PM., Disp: 90 tablet, Rfl: 3 .  blood glucose meter kit and supplies KIT, Dispense based on patient and insurance preference. Use up to four times daily as directed. (FOR ICD-9 250.00, 250.01)., Disp: 1 each, Rfl: 0 .  buPROPion (WELLBUTRIN SR) 100 MG 12 hr tablet, Take 1 tablet (100 mg total) by mouth 2 (two) times daily., Disp: 60 tablet, Rfl: 6 .  carvedilol (COREG) 25 MG tablet, Take 1 tablet (25 mg total) by mouth 2 (two) times daily with a meal., Disp: 60 tablet, Rfl: 3 .  Continuous Blood Gluc Receiver (FREESTYLE LIBRE 14 DAY READER) DEVI, 1 each by Does not apply route as needed. (Patient not taking: No sig reported), Disp: 1 each, Rfl: 11 .  Continuous Blood Gluc Sensor (FREESTYLE LIBRE 14 DAY SENSOR) MISC, 1 each by Does not apply route as needed. (Patient not taking: No sig reported), Disp: 1 each, Rfl: 11 .  cyclobenzaprine (FLEXERIL) 10 MG tablet, Take 10 mg by mouth 3 (three) times daily as needed for muscle spasms.  (Patient not taking: No sig reported), Disp: , Rfl:  .  ferrous sulfate 325 (65 FE) MG tablet, Take 325 mg by mouth in the morning and at bedtime., Disp: , Rfl:  .  furosemide (LASIX) 40 MG tablet, Take 2 tablets (80 mg total) by mouth 2 (two) times daily., Disp: 360 tablet, Rfl: 3 .  gabapentin (NEURONTIN) 300 MG  capsule, Take 2 capsules (total = 600 mg), by mouth, 3 times a day. (Patient taking differently: Take 600 mg by mouth 2 (two) times daily.), Disp: 180 capsule, Rfl: 11 .  glipiZIDE (GLUCOTROL) 10 MG tablet, Take 1 tablet (10 mg total) by mouth 2 (two) times daily before a meal., Disp: 60 tablet, Rfl: 6 .  hydrALAZINE (APRESOLINE) 25 MG tablet, Take 1.5 tablets (37.5 mg total) by mouth 3 (three) times daily., Disp: 270 tablet, Rfl: 3 .  insulin glargine (LANTUS) 100 UNIT/ML injection, Inject 0.2 mLs (20  Units total) into the skin daily., Disp: 30 mL, Rfl: 12 .  Insulin Lispro Prot & Lispro (HUMALOG MIX 75/25 KWIKPEN) (75-25) 100 UNIT/ML Kwikpen, Inject 30 Units into the skin 2 (two) times daily., Disp: 15 mL, Rfl: 11 .  Insulin Syringes, Disposable, U-100 0.3 ML MISC, 1 each by Does not apply route 3 (three) times daily., Disp: 300 each, Rfl: 3 .  isosorbide mononitrate (IMDUR) 30 MG 24 hr tablet, Take 0.5 tablets (15 mg total) by mouth daily., Disp: 45 tablet, Rfl: 3 .  metFORMIN (GLUCOPHAGE) 500 MG tablet, Take 1 tablet (500 mg total) by mouth 2 (two) times daily with a meal., Disp: 60 tablet, Rfl: 6 .  Multiple Vitamins-Minerals (CENTRUM SILVER 50+MEN) TABS, Take 1 tablet by mouth daily., Disp: , Rfl:  .  pantoprazole (PROTONIX) 40 MG tablet, TAKE 1 TABLET (40 MG TOTAL) BY MOUTH 2 (TWO) TIMES DAILY., Disp: 60 tablet, Rfl: 1 .  potassium chloride SA (KLOR-CON M20) 20 MEQ tablet, Take 1 tablet (20 mEq total) by mouth daily., Disp: 30 tablet, Rfl: 0 .  QUEtiapine (SEROQUEL) 200 MG tablet, Take 1 tablet (200 mg total) by mouth at bedtime., Disp: 30 tablet, Rfl: 6 .  sacubitril-valsartan (ENTRESTO) 97-103 MG, Take 1 tablet by mouth 2 (two) times daily., Disp: 60 tablet, Rfl: 5 .  sildenafil (VIAGRA) 100 MG tablet, Take 1 tablet (100 mg total) by mouth daily as needed for erectile dysfunction. (Patient not taking: Reported on 11/16/2020), Disp: 30 tablet, Rfl: 2 .  spironolactone (ALDACTONE) 25 MG tablet, Take 1 tablet (25 mg total) by mouth daily., Disp: 30 tablet, Rfl: 11 .  tetrahydrozoline 0.05 % ophthalmic solution, Place 1 drop into both eyes daily., Disp: , Rfl:  .  TRUEPLUS PEN NEEDLES 31G X 6 MM MISC, USE AS DIRECTED, Disp: 100 each, Rfl: 0 No Known Allergies   Social History   Socioeconomic History  . Marital status: Legally Separated    Spouse name: Not on file  . Number of children: Not on file  . Years of education: Not on file  . Highest education level: Not on file  Occupational  History  . Not on file  Tobacco Use  . 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 Smokeless tobacco: Never Used  Vaping Use  . Vaping Use: Never used  Substance and Sexual Activity  . Alcohol use: Yes    Alcohol/week: 2.0 standard drinks    Types: 2 Cans of beer per week  . Drug use: No  . Sexual activity: Yes  Other Topics Concern  . Not on file  Social History Narrative  . Not on file   Social Determinants of Health   Financial Resource Strain: Low Risk   . Difficulty of Paying Living Expenses: Not very hard  Food Insecurity: No Food Insecurity  . Worried About Charity fundraiser in the Last Year: Never true  . Ran Out of Food in the Last Year:  Never true  Transportation Needs: Unmet Transportation Needs  . Lack of Transportation (Medical): Yes  . Lack of Transportation (Non-Medical): Yes  Physical Activity: Not on file  Stress: Not on file  Social Connections: Not on file  Intimate Partner Violence: Not on file    Physical Exam Vitals reviewed.  Constitutional:      Appearance: Normal appearance. He is normal weight.  HENT:     Head: Normocephalic.     Nose: Nose normal.     Mouth/Throat:     Mouth: Mucous membranes are moist.     Pharynx: Oropharynx is clear.  Eyes:     Pupils: Pupils are equal, round, and reactive to light.  Cardiovascular:     Rate and Rhythm: Normal rate and regular rhythm.     Pulses: Normal pulses.     Heart sounds: Normal heart sounds.  Pulmonary:     Effort: Pulmonary effort is normal.     Breath sounds: Normal breath sounds.  Abdominal:     Palpations: Abdomen is soft.  Musculoskeletal:        General: Normal range of motion.     Cervical back: Normal range of motion.     Right lower leg: No edema.     Left lower leg: No edema.  Skin:    General: Skin is warm.     Capillary Refill: Capillary refill takes less than 2 seconds.  Neurological:     General: No focal deficit present.     Mental Status: He is alert. Mental status is at  baseline.  Psychiatric:        Mood and Affect: Mood normal.     Arrived for home visit for [facility] who was alert and oriented seated at the kitchen table. [name] denied any difficulties with shortness of breath, chest pain, dizziness or increase in weight over the last 7 days. Weight was 315lbs. Vitals were obtained and are as noted. Medications were confirmed and reviewed. Pill box filled accordingly. Appointments were reviewed and written down for patient. Physical evaluated and no edema noted. No JVD. Lung sounds clear. CBG- 221 (patient reports he has not had his second dose of insulin today). Home visit complete. I will see Trayshawn in one week.   I assisted [name] in filling out SSA paperwork and I mailed this in for him as well.   Refills: Gabapentin Eliquis (Need new application for BMS) Metformin Seroquel  Entresto      Future Appointments  Date Time Provider [place]  12/03/2020  7:40 AM [facility] DEVICE REMOTES [facility]  12/08/2020  1:15 PM [name], MD [facility]  12/13/2020 10:30 AM Valente David, RN [facility] None  12/14/2020 11:00 AM [facility] PA/NP [facility] None  01/19/2021  9:00 AM Azzie Glatter, FNP [facility] None  03/04/2021  7:40 AM [facility] DEVICE REMOTES [facility]  03/14/2021  9:00 AM Azzie Glatter, FNP [facility] None  06/03/2021  7:40 AM [facility] DEVICE REMOTES [facility]  09/02/2021  7:40 AM [facility] DEVICE REMOTES [facility]  12/02/2021  7:40 AM [facility] DEVICE REMOTES [facility]  03/03/2022  7:40 AM [facility] DEVICE REMOTES [facility]     ACTION: Home visit completed Next visit planned for one week

## 2020-12-01 ENCOUNTER — Other Ambulatory Visit: Payer: Self-pay

## 2020-12-01 ENCOUNTER — Telehealth (HOSPITAL_COMMUNITY): Payer: Self-pay | Admitting: Pharmacy Technician

## 2020-12-01 MED FILL — METFORMIN HCL 500 MG TABS: 500 | 30 days supply | Qty: 60 | Fill #2

## 2020-12-01 MED FILL — GABAPENTIN 300 MG CAPSULE: 300 | 30 days supply | Qty: 180 | Fill #0

## 2020-12-01 MED FILL — QUETIAPINE FUMARATE 200 MG: 200 | 30 days supply | Qty: 30 | Fill #4

## 2020-12-01 NOTE — Telephone Encounter (Signed)
Sent in [employer] application via fax.  Will follow up.  

## 2020-12-02 LAB — CUP PACEART REMOTE DEVICE CHECK
Battery Voltage: 3.11 V
Brady Statistic RV Percent Paced: 0 %
Date Time Interrogation Session: 20220106140219
HighPow Impedance: 76 Ohm
Implantable Lead Implant Date: 20211008
Implantable Lead Location: 753860
Implantable Lead Model: 436910
Implantable Lead Serial Number: 81404997
Implantable Pulse Generator Implant Date: 20211008
Lead Channel Impedance Value: 481 Ohm
Lead Channel Pacing Threshold Amplitude: 0.5 V
Lead Channel Pacing Threshold Pulse Width: 0.4 ms
Lead Channel Sensing Intrinsic Amplitude: 19.3 mV
Lead Channel Setting Pacing Amplitude: 3 V
Lead Channel Setting Pacing Pulse Width: 0.4 ms
Lead Channel Setting Sensing Sensitivity: 0.8 mV
Pulse Gen Model: 429525
Pulse Gen Serial Number: 84810752

## 2020-12-02 MED FILL — QUETIAPINE FUMARATE 300 MG: 300 | 30 days supply | Qty: 30 | Fill #0

## 2020-12-03 ENCOUNTER — Ambulatory Visit (INDEPENDENT_AMBULATORY_CARE_PROVIDER_SITE_OTHER): Payer: Medicare Other

## 2020-12-03 DIAGNOSIS — Z95810 Presence of automatic (implantable) cardiac defibrillator: Secondary | ICD-10-CM

## 2020-12-08 ENCOUNTER — Other Ambulatory Visit: Payer: Self-pay

## 2020-12-08 ENCOUNTER — Encounter: Payer: Self-pay | Admitting: Cardiology

## 2020-12-08 ENCOUNTER — Other Ambulatory Visit (HOSPITAL_COMMUNITY): Payer: Self-pay

## 2020-12-08 ENCOUNTER — Ambulatory Visit: Payer: Medicare Other | Admitting: Cardiology

## 2020-12-08 VITALS — BP 134/82 | HR 65 | Ht 74.0 in | Wt 322.8 lb

## 2020-12-08 DIAGNOSIS — Z95810 Presence of automatic (implantable) cardiac defibrillator: Secondary | ICD-10-CM | POA: Insufficient documentation

## 2020-12-08 DIAGNOSIS — I5022 Chronic systolic (congestive) heart failure: Secondary | ICD-10-CM | POA: Diagnosis not present

## 2020-12-08 DIAGNOSIS — I480 Paroxysmal atrial fibrillation: Secondary | ICD-10-CM | POA: Diagnosis not present

## 2020-12-08 NOTE — Progress Notes (Signed)
Electrophysiology Office Follow up Visit Note:    Date:  12/08/2020   ID:  [name], DOB [birth year], MRN [id number]  PCP:  Azzie Glatter, FNP  [facility] Cardiologist:  Joel Him, MD  [facility] Electrophysiologist:  Joel Epley, MD    Interval History:    [name] is a 60 y.o. male who presents for a follow up visit after ICD implant on September 03, 2020.  Since his ICD implant he has been doing well.  His ICD incision is healed well.  He has had no ICD shocks from his device.  No episodes of atrial fibrillation.  He tells me he has gained significant amount of weight over the last few months.  We discussed his diet in detail during today's visit.  It seems like he is having a lot of caloric intake from liquid sugars (juices, sweet tea).     Past Medical History:  Diagnosis Date  . Anxiety   . CAD (coronary artery disease)   . CHF (congestive heart failure) ([place]) 09/2019  . Depression   . Diabetes mellitus   . Erectile dysfunction 11/2019  . H/O right heart catheterization 09/2019  . Hypertension   . Schizophrenia ([place])   . Sleep apnea    uses cpap    Past Surgical History:  Procedure Laterality Date  . CORONARY STENT PLACEMENT    . ENTEROSCOPY N/A 04/08/2020   Procedure: ENTEROSCOPY;  Surgeon: Doran Stabler, MD;  Location: [facility] ENDOSCOPY;  Service: Gastroenterology;  Laterality: N/A;  . ENTEROSCOPY N/A 06/17/2020   Procedure: ENTEROSCOPY;  Surgeon: Irene Shipper, MD;  Location: [facility] ENDOSCOPY;  Service: Endoscopy;  Laterality: N/A;  . HEMOSTASIS CLIP PLACEMENT  04/08/2020   Procedure: HEMOSTASIS CLIP PLACEMENT;  Surgeon: Doran Stabler, MD;  Location: [place];  Service: Gastroenterology;;  . HEMOSTASIS CONTROL  04/08/2020   Procedure: HEMOSTASIS CONTROL;  Surgeon: Doran Stabler, MD;  Location: [facility] ENDOSCOPY;  Service: Gastroenterology;;  . Laretta Alstrom CONTROL  06/17/2020   Procedure: HEMOSTASIS CONTROL;  Surgeon: Irene Shipper, MD;  Location: [facility] ENDOSCOPY;  Service: Endoscopy;;  . HERNIA REPAIR    . ICD IMPLANT N/A 09/03/2020   Procedure: ICD IMPLANT;  Surgeon: Joel Epley, MD;  Location: [place] CV LAB;  Service: Cardiovascular;  Laterality: N/A;  . RIGHT/LEFT HEART CATH AND CORONARY ANGIOGRAPHY N/A 10/14/2019   Procedure: RIGHT/LEFT HEART CATH AND CORONARY ANGIOGRAPHY;  Surgeon: Belva Crome, MD;  Location: [place] CV LAB;  Service: Cardiovascular;  Laterality: N/A;    Current Medications: Current Meds  Medication Sig  . acetaminophen (TYLENOL) 500 MG tablet Take 2 tablets (1,000 mg total) by mouth in the morning and at bedtime.  [name] Kitchen apixaban (ELIQUIS) 5 MG TABS tablet Take 1 tablet (5 mg total) by mouth 2 (two) times daily.  [name] Kitchen atorvastatin (LIPITOR) 40 MG tablet Take 1 tablet (40 mg total) by mouth daily at 6 PM.  . blood glucose meter kit and supplies KIT Dispense based on patient and insurance preference. Use up to four times daily as directed. (FOR ICD-9 250.00, 250.01).  [name] Kitchen buPROPion (WELLBUTRIN SR) 100 MG 12 hr tablet Take 1 tablet (100 mg total) by mouth 2 (two) times daily.  . carvedilol (COREG) 25 MG tablet Take 1 tablet (25 mg total) by mouth 2 (two) times daily with a meal.  . Continuous Blood Gluc Receiver (FREESTYLE LIBRE 14 DAY READER) DEVI 1 each by Does not apply route  as needed.  . Continuous Blood Gluc Sensor (FREESTYLE LIBRE 14 DAY SENSOR) MISC 1 each by Does not apply route as needed.  . cyclobenzaprine (FLEXERIL) 10 MG tablet Take 10 mg by mouth 3 (three) times daily as needed for muscle spasms.  . ferrous sulfate 325 (65 FE) MG tablet Take 325 mg by mouth in the morning and at bedtime.  . furosemide (LASIX) 40 MG tablet Take 2 tablets (80 mg total) by mouth 2 (two) times daily.  [name] Kitchen gabapentin (NEURONTIN) 300 MG capsule Take 2 capsules (total = 600 mg), by mouth, 3 times a day.  [name] Kitchen glipiZIDE (GLUCOTROL) 10 MG tablet Take 1 tablet (10 mg total) by mouth 2 (two) times daily  before a meal.  . hydrALAZINE (APRESOLINE) 25 MG tablet Take 1.5 tablets (37.5 mg total) by mouth 3 (three) times daily.  . insulin glargine (LANTUS) 100 UNIT/ML injection Inject 0.2 mLs (20 Units total) into the skin daily.  . Insulin Lispro Prot & Lispro (HUMALOG MIX 75/25 KWIKPEN) (75-25) 100 UNIT/ML Kwikpen Inject 30 Units into the skin 2 (two) times daily.  . Insulin Syringes, Disposable, U-100 0.3 ML MISC 1 each by Does not apply route 3 (three) times daily.  . isosorbide mononitrate (IMDUR) 30 MG 24 hr tablet Take 0.5 tablets (15 mg total) by mouth daily.  . metFORMIN (GLUCOPHAGE) 500 MG tablet Take 1 tablet (500 mg total) by mouth 2 (two) times daily with a meal.  . Multiple Vitamins-Minerals (CENTRUM SILVER 50+MEN) TABS Take 1 tablet by mouth daily.  . pantoprazole (PROTONIX) 40 MG tablet TAKE 1 TABLET (40 MG TOTAL) BY MOUTH 2 (TWO) TIMES DAILY.  [name] Kitchen potassium chloride SA (KLOR-CON M20) 20 MEQ tablet Take 1 tablet (20 mEq total) by mouth daily.  . QUEtiapine (SEROQUEL) 300 MG tablet Take 300 mg by mouth at bedtime.  . sacubitril-valsartan (ENTRESTO) 97-103 MG Take 1 tablet by mouth 2 (two) times daily.  . sildenafil (VIAGRA) 100 MG tablet Take 1 tablet (100 mg total) by mouth daily as needed for erectile dysfunction.  [name] Kitchen spironolactone (ALDACTONE) 25 MG tablet Take 1 tablet (25 mg total) by mouth daily.  [name] Kitchen tetrahydrozoline 0.05 % ophthalmic solution Place 1 drop into both eyes daily.  . TRUEPLUS PEN NEEDLES 31G X 6 MM MISC USE AS DIRECTED  . [DISCONTINUED] QUEtiapine (SEROQUEL) 200 MG tablet Take 1 tablet (200 mg total) by mouth at bedtime.     Allergies:   Patient has no known allergies.   Social History   Socioeconomic History  . Marital status: Legally Separated    Spouse name: Not on file  . Number of children: Not on file  . Years of education: Not on file  . Highest education level: Not on file  Occupational History  . Not on file  Tobacco Use  . 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 Smokeless tobacco: Never Used  Vaping Use  . Vaping Use: Never used  Substance and Sexual Activity  . Alcohol use: Yes    Alcohol/week: 2.0 standard drinks    Types: 2 Cans of beer per week  . Drug use: No  . Sexual activity: Yes  Other Topics Concern  . Not on file  Social History Narrative  . Not on file   Social Determinants of Health   Financial Resource Strain: Low Risk   . Difficulty of Paying Living Expenses: Not very hard  Food Insecurity: No Food Insecurity  . Worried About Charity fundraiser in the Last Year:  Never true  . Ran Out of Food in the Last Year: Never true  Transportation Needs: Unmet Transportation Needs  . Lack of Transportation (Medical): Yes  . Lack of Transportation (Non-Medical): Yes  Physical Activity: Not on file  Stress: Not on file  Social Connections: Not on file     Family History: The patient's family history includes Heart failure in his mother; Mental illness in his sister and sister.  ROS:   Please see the history of present illness.    All other systems reviewed and are negative.  EKGs/Labs/Other Studies Reviewed:    The following studies were reviewed today:  December 08, 2020 device interrogation in clinic personally reviewed [employer] VDI Presenting rhythm a sensed, V since Lower rate 40 Atrial sensing 3.5 mV Ventricular lead sensing 24.2 mV, pacing threshold 0.4 V at 0.4 ms, impedance 481 ohms, shock impedance 76 ohms 0% ventricular pacing Good rate histogram Stable thoracic impedance Patient activity 10 to 15 %/day  EKG:  The ekg ordered today demonstrates sinus rhythm  Recent Labs: 03/15/2020: NT-Pro BNP 231 06/15/2020: B Natriuretic Peptide 1,620.5; Magnesium 2.1 07/19/2020: TSH 5.320 10/12/2020: ALT 19; Hemoglobin 13.1; Platelets 232 11/02/2020: BUN 26; Creatinine, Ser 1.12; Potassium 4.1; Sodium 134  Recent Lipid Panel    Component Value Date/Time   CHOL 116 07/19/2020 0940   TRIG 128 07/19/2020 0940    HDL 42 07/19/2020 0940   CHOLHDL 2.8 07/19/2020 0940   CHOLHDL 3.6 03/29/2015 1402   VLDL 24 03/29/2015 1402   LDLCALC 51 07/19/2020 0940    Physical Exam:    VS:  BP 134/82   Pulse 65   Ht '6\' 2"'  (1.88 m)   Wt (!) 322 lb 12.8 oz (146.4 kg)   SpO2 97%   BMI 41.45 kg/m     Wt Readings from Last 3 Encounters:  12/08/20 (!) 322 lb 12.8 oz (146.4 kg)  11/23/20 (!) 318 lb (144.2 kg)  11/09/20 (!) 321 lb (145.6 kg)     GEN:  Well nourished, well developed in no acute distress.  Obese HEENT: Normal NECK: No JVD; No carotid bruits LYMPHATICS: No lymphadenopathy CARDIAC: RRR, no murmurs, rubs, gallops.  Defibrillator pocket well-healed RESPIRATORY:  Clear to auscultation without rales, wheezing or rhonchi  ABDOMEN: Soft, non-tender, non-distended MUSCULOSKELETAL:  No edema; No deformity  SKIN: Warm and dry NEUROLOGIC:  Alert and oriented x 3 PSYCHIATRIC:  Normal affect   ASSESSMENT:    1. Chronic systolic congestive heart failure ([place])   2. ICD (implantable cardioverter-defibrillator) in place   3. PAF (paroxysmal atrial fibrillation) ([facility])    PLAN:    In order of problems listed above:  1. Chronic systolic heart failure NYHA class II-III symptoms.  Overall stable functional status since I last saw Long.  No ICD shocks.  ICD interrogation shows stable lead parameters and good battery longevity.  Recommend continued follow-up with Dr. Aundra Dubin.  2.  Paroxysmal atrial fibrillation On Eliquis for stroke prophylaxis No episodes on his ICD  Follow-up in 9 months or sooner as needed.   Medication Adjustments/Labs and Tests Ordered: Current medicines are reviewed at length with the patient today.  Concerns regarding medicines are outlined above.  Orders Placed This Encounter  Procedures  . EKG 12-Lead   No orders of the defined types were placed in this encounter.    Signed, Lars Mage, MD, [facility]  12/08/2020 4:05 PM    Electrophysiology [facility]  [facility]

## 2020-12-08 NOTE — Progress Notes (Signed)
Paramedicine Encounter    Patient ID: [name], male    DOB: 12/21/1960, 60 y.o.   MRN: [id number]   Patient Care Team: Azzie Glatter, FNP as PCP - General (Family Medicine) [name], MD as PCP - Cardiology (Cardiology) [name], MD as PCP - Advanced Heart Failure (Cardiology) [name], MD as PCP - Electrophysiology (Cardiology) Valente David, RN as [place]  Patient Active Problem List   Diagnosis Date Noted  . Acute blood loss anemia   . Angiodysplasia of stomach   . Chronic anticoagulation   . CHF exacerbation ([place]) 06/15/2020  . AKI (acute kidney injury) ([place]) 06/15/2020  . CKD (chronic kidney disease), stage III ([place]) 06/15/2020  . Anemia due to chronic blood loss   . Gastric AVM   . Angiodysplasia of small intestine ([place])   . Acute on chronic systolic (congestive) heart failure ([place]) 04/05/2020  . Anemia 04/05/2020  . PAF (paroxysmal atrial fibrillation) ([place]) 04/05/2020  . OSA on CPAP 04/05/2020  . Syncope and collapse 04/05/2020  . Type 2 diabetes mellitus with hyperglycemia, with long-term current use of insulin ([place]) 12/03/2019  . Diabetic polyneuropathy associated with type 2 diabetes mellitus ([place]) 12/03/2019  . Hyperglycemia 12/03/2019  . History of hyperglycemia 12/03/2019  . Erectile dysfunction 12/03/2019  . CHF (congestive heart failure) ([place]) 10/11/2019  . Paranoid schizophrenia, chronic condition ([place]) 01/26/2015  . Severe recurrent major depressive disorder with psychotic features ([place]) 01/26/2015  . GAD (generalized anxiety disorder) 01/26/2015  . OCD (obsessive compulsive disorder) 01/26/2015  . Panic disorder without agoraphobia 01/26/2015  . Insomnia 01/26/2015    Current Outpatient Medications:  .  acetaminophen (TYLENOL) 500 MG tablet, Take 2 tablets (1,000 mg total) by mouth in the morning and at bedtime., Disp: 180 tablet, Rfl: 3 .  apixaban (ELIQUIS) 5 MG TABS tablet, Take 1  tablet (5 mg total) by mouth 2 (two) times daily., Disp: 180 tablet, Rfl: 3 .  atorvastatin (LIPITOR) 40 MG tablet, Take 1 tablet (40 mg total) by mouth daily at 6 PM., Disp: 90 tablet, Rfl: 3 .  blood glucose meter kit and supplies KIT, Dispense based on patient and insurance preference. Use up to four times daily as directed. (FOR ICD-9 250.00, 250.01)., Disp: 1 each, Rfl: 0 .  buPROPion (WELLBUTRIN SR) 100 MG 12 hr tablet, Take 1 tablet (100 mg total) by mouth 2 (two) times daily., Disp: 60 tablet, Rfl: 6 .  carvedilol (COREG) 25 MG tablet, Take 1 tablet (25 mg total) by mouth 2 (two) times daily with a meal., Disp: 60 tablet, Rfl: 3 .  Continuous Blood Gluc Receiver (FREESTYLE LIBRE 14 DAY READER) DEVI, 1 each by Does not apply route as needed. (Patient not taking: No sig reported), Disp: 1 each, Rfl: 11 .  Continuous Blood Gluc Sensor (FREESTYLE LIBRE 14 DAY SENSOR) MISC, 1 each by Does not apply route as needed. (Patient not taking: No sig reported), Disp: 1 each, Rfl: 11 .  cyclobenzaprine (FLEXERIL) 10 MG tablet, Take 10 mg by mouth 3 (three) times daily as needed for muscle spasms.  (Patient not taking: No sig reported), Disp: , Rfl:  .  ferrous sulfate 325 (65 FE) MG tablet, Take 325 mg by mouth in the morning and at bedtime., Disp: , Rfl:  .  furosemide (LASIX) 40 MG tablet, Take 2 tablets (80 mg total) by mouth 2 (two) times daily., Disp: 360 tablet, Rfl: 3 .  gabapentin (NEURONTIN) 300 MG  capsule, Take 2 capsules (total = 600 mg), by mouth, 3 times a day. (Patient taking differently: Take 600 mg by mouth 2 (two) times daily.), Disp: 180 capsule, Rfl: 11 .  glipiZIDE (GLUCOTROL) 10 MG tablet, Take 1 tablet (10 mg total) by mouth 2 (two) times daily before a meal., Disp: 60 tablet, Rfl: 6 .  hydrALAZINE (APRESOLINE) 25 MG tablet, Take 1.5 tablets (37.5 mg total) by mouth 3 (three) times daily., Disp: 270 tablet, Rfl: 3 .  insulin glargine (LANTUS) 100 UNIT/ML injection, Inject 0.2 mLs (20  Units total) into the skin daily., Disp: 30 mL, Rfl: 12 .  Insulin Lispro Prot & Lispro (HUMALOG MIX 75/25 KWIKPEN) (75-25) 100 UNIT/ML Kwikpen, Inject 30 Units into the skin 2 (two) times daily., Disp: 15 mL, Rfl: 11 .  Insulin Syringes, Disposable, U-100 0.3 ML MISC, 1 each by Does not apply route 3 (three) times daily., Disp: 300 each, Rfl: 3 .  isosorbide mononitrate (IMDUR) 30 MG 24 hr tablet, Take 0.5 tablets (15 mg total) by mouth daily., Disp: 45 tablet, Rfl: 3 .  metFORMIN (GLUCOPHAGE) 500 MG tablet, Take 1 tablet (500 mg total) by mouth 2 (two) times daily with a meal., Disp: 60 tablet, Rfl: 6 .  Multiple Vitamins-Minerals (CENTRUM SILVER 50+MEN) TABS, Take 1 tablet by mouth daily., Disp: , Rfl:  .  pantoprazole (PROTONIX) 40 MG tablet, TAKE 1 TABLET (40 MG TOTAL) BY MOUTH 2 (TWO) TIMES DAILY., Disp: 60 tablet, Rfl: 1 .  potassium chloride SA (KLOR-CON M20) 20 MEQ tablet, Take 1 tablet (20 mEq total) by mouth daily., Disp: 30 tablet, Rfl: 0 .  QUEtiapine (SEROQUEL) 200 MG tablet, Take 1 tablet (200 mg total) by mouth at bedtime., Disp: 30 tablet, Rfl: 6 .  sacubitril-valsartan (ENTRESTO) 97-103 MG, Take 1 tablet by mouth 2 (two) times daily., Disp: 60 tablet, Rfl: 5 .  sildenafil (VIAGRA) 100 MG tablet, Take 1 tablet (100 mg total) by mouth daily as needed for erectile dysfunction. (Patient not taking: Reported on 11/16/2020), Disp: 30 tablet, Rfl: 2 .  spironolactone (ALDACTONE) 25 MG tablet, Take 1 tablet (25 mg total) by mouth daily., Disp: 30 tablet, Rfl: 11 .  tetrahydrozoline 0.05 % ophthalmic solution, Place 1 drop into both eyes daily., Disp: , Rfl:  .  TRUEPLUS PEN NEEDLES 31G X 6 MM MISC, USE AS DIRECTED, Disp: 100 each, Rfl: 0 No Known Allergies   Social History   Socioeconomic History  . Marital status: Legally Separated    Spouse name: Not on file  . Number of children: Not on file  . Years of education: Not on file  . Highest education level: Not on file  Occupational  History  . Not on file  Tobacco Use  . 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 Smokeless tobacco: Never Used  Vaping Use  . Vaping Use: Never used  Substance and Sexual Activity  . Alcohol use: Yes    Alcohol/week: 2.0 standard drinks    Types: 2 Cans of beer per week  . Drug use: No  . Sexual activity: Yes  Other Topics Concern  . Not on file  Social History Narrative  . Not on file   Social Determinants of Health   Financial Resource Strain: Low Risk   . Difficulty of Paying Living Expenses: Not very hard  Food Insecurity: No Food Insecurity  . Worried About Charity fundraiser in the Last Year: Never true  . Ran Out of Food in the Last Year:  Never true  Transportation Needs: Unmet Transportation Needs  . Lack of Transportation (Medical): Yes  . Lack of Transportation (Non-Medical): Yes  Physical Activity: Not on file  Stress: Not on file  Social Connections: Not on file  Intimate Partner Violence: Not on file    Physical Exam Vitals reviewed.  Constitutional:      Appearance: He is normal weight.  HENT:     Head: Normocephalic.     Nose: Nose normal.     Mouth/Throat:     Mouth: Mucous membranes are moist.     Pharynx: Oropharynx is clear.  Eyes:     Pupils: Pupils are equal, round, and reactive to light.  Cardiovascular:     Rate and Rhythm: Normal rate and regular rhythm.     Pulses: Normal pulses.     Heart sounds: Normal heart sounds.  Pulmonary:     Effort: Pulmonary effort is normal.     Breath sounds: Normal breath sounds.  Abdominal:     General: Abdomen is flat.     Palpations: Abdomen is soft.  Musculoskeletal:        General: Normal range of motion.     Cervical back: Normal range of motion.     Right lower leg: No edema.     Left lower leg: No edema.  Skin:    General: Skin is warm and dry.     Capillary Refill: Capillary refill takes less than 2 seconds.  Neurological:     General: No focal deficit present.     Mental Status: He is alert.  Mental status is at baseline.  Psychiatric:        Mood and Affect: Mood normal.    Arrived for home visit for [name] who was alert and oriented reporting he was feeling fine and denied any dizziness, chest pain but, reported some slight shortness of breath u[on exertion but [name] has missed some of his medications. [name] has an appointment this afternoon with cardiology. I assisted him with setting up transportation for next weeks clinic visit. [facility] will pick him up at 10:15. I confirmed medications and pill box was filled. We also contacted his BCBS card and activated his OTC Card and confirmed he can join the [facility] for free with his new benefits. [name] was happy with this. I plan to see [name] in one week. Home visit complete.   Refills: Spironolactone Pantoprazole Wellbutrin       Future Appointments  Date Time Provider [place]  12/08/2020  3:30 PM [name], MD [facility]  12/13/2020 10:30 AM Valente David, RN [facility] None  12/14/2020 11:00 AM [facility] PA/NP [facility] None  01/19/2021  9:00 AM Azzie Glatter, FNP [facility] None  03/04/2021  7:40 AM [facility] DEVICE REMOTES [facility]  03/14/2021  9:00 AM Azzie Glatter, FNP [facility] None  06/03/2021  7:40 AM [facility] DEVICE REMOTES [facility]  09/02/2021  7:40 AM [facility] DEVICE REMOTES [facility]  12/02/2021  7:40 AM [facility] DEVICE REMOTES [facility]  03/03/2022  7:40 AM [facility] DEVICE REMOTES [facility]     ACTION: Home visit completed Next visit planned for one week in clinic

## 2020-12-08 NOTE — Patient Instructions (Addendum)
Medication Instructions:  Your physician recommends that you continue on your current medications as directed. Please refer to the Current Medication list given to you today.  Labwork: None ordered.  Testing/Procedures: None ordered.  Follow-Up: Your physician wants you to follow-up in: 9 months with Dr. Quentin Ore.   You will receive a reminder letter in the mail two months in advance. If you don't receive a letter, please call our office to schedule the follow-up appointment.  Remote monitoring is used to monitor your ICD from home. This monitoring reduces the number of office visits required to check your device to one time per year. It allows [place] to keep an eye on the functioning of your device to ensure it is working properly. You are scheduled for a device check from home on 03/04/2021. You may send your transmission at any time that day. If you have a wireless device, the transmission will be sent automatically. After your physician reviews your transmission, you will receive a postcard with your next transmission date.  Any Other Special Instructions Will Be Listed Below (If Applicable).  If you need a refill on your cardiac medications before your next appointment, please call your pharmacy.   NO SWEET TEA  NO FRUIT JUICES  Mediterranean Diet A Mediterranean diet refers to food and lifestyle choices that are based on the traditions of countries located on the [employer]. This way of eating has been shown to help prevent certain conditions and improve outcomes for people who have chronic diseases, like kidney disease and heart disease. What are tips for following this plan? Lifestyle  Cook and eat meals together with your family, when possible.  Drink enough fluid to keep your urine clear or pale yellow.  Be physically active every day. This includes: ? Aerobic exercise like running or swimming. ? Leisure activities like gardening, walking, or housework.  Get 7-8 hours of  sleep each night.  If recommended by your health care provider, drink red wine in moderation. This means 1 glass a day for nonpregnant women and 2 glasses a day for men. A glass of wine equals 5 oz (150 mL). Reading food labels  Check the serving size of packaged foods. For foods such as rice and pasta, the serving size refers to the amount of cooked product, not dry.  Check the total fat in packaged foods. Avoid foods that have saturated fat or trans fats.  Check the ingredients list for added sugars, such as corn syrup.   Shopping  At the grocery store, buy most of your food from the areas near the walls of the store. This includes: ? Fresh fruits and vegetables (produce). ? Grains, beans, nuts, and seeds. Some of these may be available in unpackaged forms or large amounts (in bulk). ? Fresh seafood. ? Poultry and eggs. ? Low-fat dairy products.  Buy whole ingredients instead of prepackaged foods.  Buy fresh fruits and vegetables in-season from local farmers markets.  Buy frozen fruits and vegetables in resealable bags.  If you do not have access to quality fresh seafood, buy precooked frozen shrimp or canned fish, such as tuna, salmon, or sardines.  Buy small amounts of raw or cooked vegetables, salads, or olives from the deli or salad bar at your store.  Stock your pantry so you always have certain foods on hand, such as olive oil, canned tuna, canned tomatoes, rice, pasta, and beans. Cooking  Cook foods with extra-virgin olive oil instead of using butter or other vegetable oils.  Have meat as a side dish, and have vegetables or grains as your main dish. This means having meat in small portions or adding small amounts of meat to foods like pasta or stew.  Use beans or vegetables instead of meat in common dishes like chili or lasagna.  Experiment with different cooking methods. Try roasting or broiling vegetables instead of steaming or sauteing them.  Add frozen vegetables  to soups, stews, pasta, or rice.  Add nuts or seeds for added healthy fat at each meal. You can add these to yogurt, salads, or vegetable dishes.  Marinate fish or vegetables using olive oil, lemon juice, garlic, and fresh herbs. Meal planning  Plan to eat 1 vegetarian meal one day each week. Try to work up to 2 vegetarian meals, if possible.  Eat seafood 2 or more times a week.  Have healthy snacks readily available, such as: ? Vegetable sticks with hummus. ? [place] yogurt. ? Fruit and nut trail mix.  Eat balanced meals throughout the week. This includes: ? Fruit: 2-3 servings a day ? Vegetables: 4-5 servings a day ? Low-fat dairy: 2 servings a day ? Fish, poultry, or lean meat: 1 serving a day ? Beans and legumes: 2 or more servings a week ? Nuts and seeds: 1-2 servings a day ? Whole grains: 6-8 servings a day ? Extra-virgin olive oil: 3-4 servings a day  Limit red meat and sweets to only a few servings a month   What are my food choices?  Mediterranean diet ? Recommended  Grains: Whole-grain pasta. Brown rice. Bulgar wheat. Polenta. Couscous. Whole-wheat bread. [place].  Vegetables: Artichokes. Beets. Broccoli. Cabbage. Carrots. Eggplant. Green beans. Chard. Kale. Spinach. Onions. Leeks. Peas. Squash. Tomatoes. Peppers. Radishes.  Fruits: Apples. Apricots. Avocado. Berries. Bananas. Cherries. Dates. Figs. Grapes. Lemons. Melon. Oranges. Peaches. Plums. Pomegranate.  Meats and other protein foods: Beans. Almonds. Sunflower seeds. Pine nuts. Peanuts. [employer]. Salmon. Scallops. Shrimp. [place]. Tilapia. Clams. Oysters. Eggs.  Dairy: Low-fat milk. Cheese. Greek yogurt.  Beverages: Water. Red wine. Herbal tea.  Fats and oils: Extra virgin olive oil. Avocado oil. Grape seed oil.  Sweets and desserts: [place] yogurt with honey. Baked apples. Poached pears. Trail mix.  Seasoning and other foods: Basil. Cilantro. Coriander. Cumin. Mint. Parsley. Sage. Rosemary. Tarragon.  Garlic. Oregano. Thyme. Pepper. Balsalmic vinegar. Tahini. Hummus. Tomato sauce. Olives. Mushrooms. ? Limit these  Grains: Prepackaged pasta or rice dishes. Prepackaged cereal with added sugar.  Vegetables: Deep fried potatoes (french fries).  Fruits: Fruit canned in syrup.  Meats and other protein foods: Beef. Pork. Lamb. Poultry with skin. Hot dogs. Bernie[place] Salines.  Dairy: Ice cream. Sour cream. Whole milk.  Beverages: Juice. Sugar-sweetened soft drinks. Beer. Liquor and spirits.  Fats and oils: Butter. Canola oil. Vegetable oil. Beef fat (tallow). Lard.  Sweets and desserts: Cookies. Cakes. Pies. Candy.  Seasoning and other foods: Mayonnaise. Premade sauces and marinades. The items listed may not be a complete list. Talk with your dietitian about what dietary choices are right for you. Summary  The Mediterranean diet includes both food and lifestyle choices.  Eat a variety of fresh fruits and vegetables, beans, nuts, seeds, and whole grains.  Limit the amount of red meat and sweets that you eat.  Talk with your health care provider about whether it is safe for you to drink red wine in moderation. This means 1 glass a day for nonpregnant women and 2 glasses a day for men. A glass of wine equals 5 oz (150 mL).  This information is not intended to replace advice given to you by your health care provider. Make sure you discuss any questions you have with your health care provider. Document Revised: 07/13/2016 Document Reviewed: 07/06/2016 Elsevier Patient Education  [employer].

## 2020-12-09 ENCOUNTER — Other Ambulatory Visit: Payer: Self-pay | Admitting: Family Medicine

## 2020-12-09 DIAGNOSIS — E1165 Type 2 diabetes mellitus with hyperglycemia: Secondary | ICD-10-CM

## 2020-12-09 DIAGNOSIS — F419 Anxiety disorder, unspecified: Secondary | ICD-10-CM

## 2020-12-09 MED FILL — PANTOPRAZOLE SOD DR 40 MG T: 40 | 30 days supply | Qty: 60 | Fill #1

## 2020-12-09 MED FILL — SPIRONOLACTONE 25 MG TABLET: 25 | 30 days supply | Qty: 30 | Fill #8

## 2020-12-09 NOTE — Telephone Encounter (Signed)
Is this okay to refill? 

## 2020-12-10 MED FILL — BUPROPION HCL SR 100 MG TAB: 100 | 30 days supply | Qty: 60 | Fill #0

## 2020-12-13 ENCOUNTER — Other Ambulatory Visit: Payer: Self-pay | Admitting: *Deleted

## 2020-12-13 NOTE — Patient Outreach (Signed)
[facility]) Care Management  12/13/2020  THURMOND HILDEBRAN [birth year] [id number]   Outgoing call to member, no answer, unable to leave message.  Will follow up within the next 3-4 business days.    Update:  Incoming call received back from member.  State he is doing well, have a slight cold today.  Denies fever or other Covid symptoms, denies he has had any sick contacts.  Reminded of appointment with heart failure clinic tomorrow, noted that EMT helped to secure transportation.  He has not checked weight, blood pressure, or glucose today, state he also didn't check yesterday.  Re-educated on importance of daily self monitoring, verbalizes understanding.    Discussed ongoing maintenance of medical conditions, including diet, medications, and exercise.  State he is doing his best to maintain his diet, report compliance with meds.  He will begin an exercise routine once the weather is clear.  Denies any urgent concerns, encouraged to contact this care manager with questions.  Agrees to follow up within the next month.  Goals Addressed            This Visit's Progress   . [facility] - Make and Keep All Appointments   On track    Follow Up Date 01/10/2021  Timeframe:  Short-Term Goal Priority:  Medium Start Date:    11/17/2020                         Expected End Date:       12/18/2020                  - ask family or friend for a ride - call to cancel if needed - keep a calendar with appointment dates    Why is this important?   Part of staying healthy is seeing the doctor for follow-up care.  If you forget your appointments, there are some things you can do to stay on track.    Notes:   11/24 - Upcoming appointments reviewed, encouraged to keep all dates  12/22 - Reminded of importance of attending all MD appointments     . [facility] - Track and Manage Activity and Exertion   On track    Follow Up Date 01/10/2021  Timeframe:  Long-Range Goal Priority:   High Start Date:     11/17/2020                        Expected End Date:       01/18/2021                  - drink water to stay hydrated during exercise - follow activity or exercise plan - make an activity or exercise plan - pace activity allowing for rest - track symptoms during activity in diary    Why is this important?   Exercising is very important when managing your heart failure.  It will help your heart get stronger.    Notes:   11/24 - Activity levels reviewed, encouraged to continue to increase as tolerated.  Daily weights reviewed  12/22 - Reminded to monitor weight, BP, and CBG daily and record trends         Valente David, Therapist, sports, MSN [place] Manager [phone]

## 2020-12-14 ENCOUNTER — Telehealth (HOSPITAL_COMMUNITY): Payer: Self-pay

## 2020-12-14 ENCOUNTER — Encounter (HOSPITAL_COMMUNITY): Payer: Medicare Other

## 2020-12-14 NOTE — Telephone Encounter (Signed)
Attempted to reach [name] about his clinic visit today with no answer. I will continue to reach out to reschedule.

## 2020-12-17 NOTE — Progress Notes (Signed)
Remote ICD transmission.   

## 2020-12-21 ENCOUNTER — Other Ambulatory Visit: Payer: Self-pay | Admitting: Family Medicine

## 2020-12-21 ENCOUNTER — Encounter: Payer: Self-pay | Admitting: Family Medicine

## 2020-12-21 ENCOUNTER — Other Ambulatory Visit (HOSPITAL_COMMUNITY): Payer: Self-pay

## 2020-12-21 DIAGNOSIS — Z125 Encounter for screening for malignant neoplasm of prostate: Secondary | ICD-10-CM

## 2020-12-21 NOTE — Progress Notes (Signed)
Paramedicine Encounter    Patient ID: [name], male    DOB: 04-28-1961, 60 y.o.   MRN: [id number]   Patient Care Team: Azzie Glatter, FNP as PCP - General (Family Medicine) [name], MD as PCP - Cardiology (Cardiology) [name], MD as PCP - Advanced Heart Failure (Cardiology) [name], MD as PCP - Electrophysiology (Cardiology) Valente David, RN as [place] Management  Patient Active Problem List   Diagnosis Date Noted  . 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 Acute blood loss anemia   . Angiodysplasia of stomach   . Chronic anticoagulation   . CHF exacerbation ([place]) 06/15/2020  . AKI (acute kidney injury) ([place]) 06/15/2020  . CKD (chronic kidney disease), stage III ([place]) 06/15/2020  . Anemia due to chronic blood loss   . Gastric AVM   . Angiodysplasia of small intestine ([place])   . Acute on chronic systolic (congestive) heart failure ([place]) 04/05/2020  . Anemia 04/05/2020  . PAF (paroxysmal atrial fibrillation) ([place]) 04/05/2020  . OSA on CPAP 04/05/2020  . Syncope and collapse 04/05/2020  . Type 2 diabetes mellitus with hyperglycemia, with long-term current use of insulin ([place]) 12/03/2019  . Diabetic polyneuropathy associated with type 2 diabetes mellitus ([place]) 12/03/2019  . Hyperglycemia 12/03/2019  . History of hyperglycemia 12/03/2019  . Erectile dysfunction 12/03/2019  . CHF (congestive heart failure) ([place]) 10/11/2019  . Paranoid schizophrenia, chronic condition ([place]) 01/26/2015  . Severe recurrent major depressive disorder with psychotic features ([place]) 01/26/2015  . GAD (generalized anxiety disorder) 01/26/2015  . OCD (obsessive compulsive disorder) 01/26/2015  . Panic disorder without agoraphobia 01/26/2015  . Insomnia 01/26/2015    Current Outpatient Medications:  .  acetaminophen (TYLENOL) 500 MG tablet, Take 2 tablets (1,000 mg total) by mouth in the morning and at bedtime.,  Disp: 180 tablet, Rfl: 3 .  apixaban (ELIQUIS) 5 MG TABS tablet, Take 1 tablet (5 mg total) by mouth 2 (two) times daily., Disp: 180 tablet, Rfl: 3 .  atorvastatin (LIPITOR) 40 MG tablet, Take 1 tablet (40 mg total) by mouth daily at 6 PM., Disp: 90 tablet, Rfl: 3 .  blood glucose meter kit and supplies KIT, Dispense based on patient and insurance preference. Use up to four times daily as directed. (FOR ICD-9 250.00, 250.01)., Disp: 1 each, Rfl: 0 .  buPROPion (WELLBUTRIN SR) 100 MG 12 hr tablet, TAKE 1 TABLET (100 MG TOTAL) BY MOUTH 2 (TWO) TIMES DAILY., Disp: 60 tablet, Rfl: 6 .  carvedilol (COREG) 25 MG tablet, Take 1 tablet (25 mg total) by mouth 2 (two) times daily with a meal., Disp: 60 tablet, Rfl: 3 .  Continuous Blood Gluc Receiver (FREESTYLE LIBRE 14 DAY READER) DEVI, 1 each by Does not apply route as needed., Disp: 1 each, Rfl: 11 .  Continuous Blood Gluc Sensor (FREESTYLE LIBRE 14 DAY SENSOR) MISC, 1 each by Does not apply route as needed., Disp: 1 each, Rfl: 11 .  cyclobenzaprine (FLEXERIL) 10 MG tablet, Take 10 mg by mouth 3 (three) times daily as needed for muscle spasms., Disp: , Rfl:  .  ferrous sulfate 325 (65 FE) MG tablet, Take 325 mg by mouth in the morning and at bedtime., Disp: , Rfl:  .  furosemide (LASIX) 40 MG tablet, Take 2 tablets (80 mg total) by mouth 2 (two) times daily., Disp: 360 tablet, Rfl: 3 .  gabapentin (NEURONTIN) 300 MG capsule, Take 2 capsules (total = 600 mg), by mouth, 3  times a day., Disp: 180 capsule, Rfl: 11 .  glipiZIDE (GLUCOTROL) 10 MG tablet, Take 1 tablet (10 mg total) by mouth 2 (two) times daily before a meal., Disp: 60 tablet, Rfl: 6 .  hydrALAZINE (APRESOLINE) 25 MG tablet, Take 1.5 tablets (37.5 mg total) by mouth 3 (three) times daily., Disp: 270 tablet, Rfl: 3 .  insulin glargine (LANTUS) 100 UNIT/ML injection, Inject 0.2 mLs (20 Units total) into the skin daily., Disp: 30 mL, Rfl: 12 .  Insulin Lispro Prot & Lispro (HUMALOG MIX 75/25 KWIKPEN)  (75-25) 100 UNIT/ML Kwikpen, Inject 30 Units into the skin 2 (two) times daily., Disp: 15 mL, Rfl: 11 .  Insulin Syringes, Disposable, U-100 0.3 ML MISC, 1 each by Does not apply route 3 (three) times daily., Disp: 300 each, Rfl: 3 .  isosorbide mononitrate (IMDUR) 30 MG 24 hr tablet, Take 0.5 tablets (15 mg total) by mouth daily., Disp: 45 tablet, Rfl: 3 .  metFORMIN (GLUCOPHAGE) 500 MG tablet, Take 1 tablet (500 mg total) by mouth 2 (two) times daily with a meal., Disp: 60 tablet, Rfl: 6 .  Multiple Vitamins-Minerals (CENTRUM SILVER 50+MEN) TABS, Take 1 tablet by mouth daily., Disp: , Rfl:  .  pantoprazole (PROTONIX) 40 MG tablet, TAKE 1 TABLET (40 MG TOTAL) BY MOUTH 2 (TWO) TIMES DAILY., Disp: 60 tablet, Rfl: 1 .  potassium chloride SA (KLOR-CON M20) 20 MEQ tablet, Take 1 tablet (20 mEq total) by mouth daily., Disp: 30 tablet, Rfl: 0 .  QUEtiapine (SEROQUEL) 300 MG tablet, Take 300 mg by mouth at bedtime., Disp: , Rfl:  .  sacubitril-valsartan (ENTRESTO) 97-103 MG, Take 1 tablet by mouth 2 (two) times daily., Disp: 60 tablet, Rfl: 5 .  sildenafil (VIAGRA) 100 MG tablet, Take 1 tablet (100 mg total) by mouth daily as needed for erectile dysfunction., Disp: 30 tablet, Rfl: 2 .  spironolactone (ALDACTONE) 25 MG tablet, Take 1 tablet (25 mg total) by mouth daily., Disp: 30 tablet, Rfl: 11 .  tetrahydrozoline 0.05 % ophthalmic solution, Place 1 drop into both eyes daily., Disp: , Rfl:  .  TRUEPLUS PEN NEEDLES 31G X 6 MM MISC, USE AS DIRECTED, Disp: 100 each, Rfl: 0 No Known Allergies   Social History   Socioeconomic History  . Marital status: Legally Separated    Spouse name: Not on file  . Number of children: Not on file  . Years of education: Not on file  . Highest education level: Not on file  Occupational History  . Not on file  Tobacco Use  . 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 Smokeless tobacco: Never Used  Vaping Use  . Vaping Use: Never used  Substance and Sexual Activity  .  Alcohol use: Yes    Alcohol/week: 2.0 standard drinks    Types: 2 Cans of beer per week  . Drug use: No  . Sexual activity: Yes  Other Topics Concern  . Not on file  Social History Narrative  . Not on file   Social Determinants of Health   Financial Resource Strain: Low Risk   . Difficulty of Paying Living Expenses: Not very hard  Food Insecurity: No Food Insecurity  . Worried About Charity fundraiser in the Last Year: Never true  . Ran Out of Food in the Last Year: Never true  Transportation Needs: Unmet Transportation Needs  . Lack of Transportation (Medical): Yes  . Lack of Transportation (Non-Medical): Yes  Physical Activity: Not on file  Stress: Not on file  Social Connections: Not on file  Intimate Partner Violence: Not on file    Physical Exam Vitals reviewed.  Constitutional:      Appearance: He is normal weight.  HENT:     Head: Normocephalic.     Nose: Nose normal.     Mouth/Throat:     Mouth: Mucous membranes are moist.     Pharynx: Oropharynx is clear.  Eyes:     Pupils: Pupils are equal, round, and reactive to light.  Cardiovascular:     Rate and Rhythm: Normal rate and regular rhythm.     Pulses: Normal pulses.     Heart sounds: Normal heart sounds.  Pulmonary:     Effort: Pulmonary effort is normal.     Breath sounds: Normal breath sounds.  Abdominal:     General: Abdomen is flat.     Palpations: Abdomen is soft.  Musculoskeletal:        General: Normal range of motion.     Cervical back: Normal range of motion.     Right lower leg: No edema.     Left lower leg: No edema.  Skin:    General: Skin is warm and dry.     Capillary Refill: Capillary refill takes less than 2 seconds.  Neurological:     General: No focal deficit present.     Mental Status: He is alert. Mental status is at baseline.  Psychiatric:        Mood and Affect: Mood normal.     Arrived for home visit for [facility] who was alert and oriented seated in the kitchen reporting he  was feeling pretty good. Vitals obtained. No swelling noted. Lung sounds clear. [name] says he has not been eating as good as he should, I educated [name] on the importance of eating low sodium low sugar diets due to his conditions. He verbalized understanding. [name] expressed he wants a prostate exam, I messaged PCP who reports she sent a referral to [facility] for same. Medications were reviewed and confirmed. Pill boxes (2) filled. Multiple medications needing refills and missing in pill box. I will call in the following and once picked up come place them in the appropriate spots.    [name] informed me he is to have his teeth pulled tomorrow, and he needs his Eliquis held. I made sure pill box reflected same.   CBG- 255   Home visit complete. I will see [name] for med rec once meds are ready for pick up and follow up with complete home visit in two weeks.  Refills: Pantoprazole Potassium Spironolactone Carvedilol Metformin Wellbutrin Lasix Glipizide Eliquis Gabapentin Hydralazine     Future Appointments  Date Time Provider [place]  01/10/2021 10:30 AM Valente David, RN [facility] None  01/19/2021  9:00 AM Azzie Glatter, FNP [facility] None  03/04/2021  7:40 AM [facility] DEVICE REMOTES [facility]  03/14/2021  9:00 AM Azzie Glatter, FNP [facility] None  06/03/2021  7:40 AM [facility] DEVICE REMOTES [facility]  09/02/2021  7:40 AM [facility] DEVICE REMOTES [facility]  12/02/2021  7:40 AM [facility] DEVICE REMOTES [facility]  03/03/2022  7:40 AM [facility] DEVICE REMOTES [facility]     ACTION: Home visit completed Next visit planned for two weeks

## 2020-12-22 MED FILL — PANTOPRAZOLE SOD DR 40 MG T: 40 | 30 days supply | Qty: 60 | Fill #1

## 2020-12-22 MED FILL — CARVEDILOL 25 MG TABLET: 25 | 30 days supply | Qty: 60 | Fill #1

## 2020-12-22 MED FILL — SPIRONOLACTONE 25 MG TABLET: 25 | 30 days supply | Qty: 30 | Fill #8

## 2020-12-22 MED FILL — hydrALAZINE HCL 25 MG TABS: 25 | 60 days supply | Qty: 270 | Fill #1

## 2020-12-22 MED FILL — glipiZIDE 10 MG TABS: 10 | 30 days supply | Qty: 60 | Fill #2

## 2020-12-22 MED FILL — FUROSEMIDE 40 MG TAB: 40 | 30 days supply | Qty: 120 | Fill #5

## 2020-12-22 MED FILL — POTASSIUM CL ER 20 MEQ TAB: 20 | 30 days supply | Qty: 60 | Fill #3

## 2020-12-26 NOTE — Progress Notes (Signed)
Advanced Heart Failure Clinic Note PCP: Kallie Locks, FNP Cardiology: Dr. Mayford Knife HF Cardiology: Dr. Shirlee Latch  60 y.o. with history of chronic systolic CHF, CAD, type 2 diabetes, paroxysmal atrial fibrillation, and schizophrenia was referred by Dr. Mayford Knife for evaluation of CHF.  Patient had OM2 PCI in 2012.  In 11/20, he was admitted with CHF. Echo showed EF 25-30% with diffuse hypokinesis.  LHC was done, showing occluded OM2 at prior stent, 90% D1 stenosis, and extensive diffuse RCA disease.  No intervention.  He was thought to be in paroxysmal atrial fibrillation during this appointment and apixaban was started.   He does not smoke, rarely drinks, and does not use drugs.  His mother had "heart problems."    He can write his name only. He is only able to read a few words. Thinks he completed the 7th grade.    Echo in 5/21 showed EF 30% with diffuse hypokinesis, mild LVH, PASP 38, mildly decreased RV systolic function, IVC dilated. Biotronik ICD placed.   He was hospitalized in 7/21 with upper GI bleeding and CHF exacerbation.  EGD showed duodenal AVMs, treated with APC. He was diuresed.   He returned for followup 12/21 for CHF.  He is being followed by paramedicine.  Weight is up 13 lbs.  He says that he ate a lot over Thanksgiving.  No significant exertional dyspnea. He is not consistent with CPAP, needs a new filter.  No chest pain.  No orthopnea/PND.  BP high today, says he took all his meds. Coreg increased to 25 mg bid and BiDil added.  Today he returns for HF follow up. Overall feeling fine. Gets SOB with stairs. Denies increasing SOB, CP, dizziness, edema, or PND/Orthopnea. Appetite ok. No fever or chills. Weight at home ~319 pounds. Taking all medications. Not wearing cpap currently, needs a new filter. Drinking >2L/day of fluid. Taking Viagra once a week, says it is not working.  Labs (1/21): LDL 66, HDL 42, hgb 11.3, K 4.7, creatinine 1.32 Labs (4/21): K 5, creatinine 1.37 Labs  (7/21): K 4.1, creatinine 1.26, hgb 9.3 Labs (8/21): LDL 51, K 3.8, creatinine 1.3 Labs (11/21): K 4, creatinine 1.42, hgb 13.1, hgbA1c 11.7 Labs (12/21): K 4.1, creatinine 1.12  ECG (personally reviewed): not ordered today.  PMH:  1. Atrial fibrillation: Paroxysmal 2. Type 2 diabetes 3. HTN 4. Hyperlipidemia 5. Schizophrenia 6. CAD: PCI OM2 in 2012.  - LHC (11/20): 90% D1 stenosis, totally occluded OM2 at stent, serial 85%/70%/60% RCA stenoses.  7. 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   - Echo (11/20): EF 25-30%, global hypokinesis.  - Echo (5/21): EF 30% with diffuse hypokinesis, mild LVH, PASP 38, mildly decreased RV systolic function, IVC dilated. 8. Upper GI bleeding: 7/21, duodenal AVMs treated with APC.  9. OSA: Does not use CPAP regularly.  10. ED: on Viagra, counseled on holding Imdur on days he uses Viagra.  Social History   Socioeconomic History  . Marital status: Legally Separated    Spouse name: Not on file  . Number of children: Not on file  . Years of education: Not on file  . Highest education level: Not on file  Occupational History  . Not on file  Tobacco Use  . Smoking status: Former Games developer  . Smokeless tobacco: Never Used  Vaping Use  . Vaping Use: Never used  Substance and Sexual Activity  . Alcohol use: Yes    Alcohol/week: 2.0 standard drinks    Types: 2 Cans of  beer per week  . Drug use: No  . Sexual activity: Yes  Other Topics Concern  . Not on file  Social History Narrative  . Not on file   Social Determinants of Health   Financial Resource Strain: Not on file  Food Insecurity: No Food Insecurity  . Worried About Programme researcher, broadcasting/film/video in the Last Year: Never true  . Ran Out of Food in the Last Year: Never true  Transportation Needs: Unmet Transportation Needs  . Lack of Transportation (Medical): Yes  . Lack of Transportation (Non-Medical): Yes  Physical Activity: Not on file  Stress: Not on file   Social Connections: Not on file  Intimate Partner Violence: Not on file   Family History  Problem Relation Age of Onset  . Heart failure Mother   . Mental illness Sister   . Mental illness Sister    ROS: All systems reviewed and negative except as per HPI.  Current Meds  Medication Sig  . acetaminophen (TYLENOL) 500 MG tablet Take 2 tablets (1,000 mg total) by mouth in the morning and at bedtime.  [name] Kitchen apixaban (ELIQUIS) 5 MG TABS tablet Take 1 tablet (5 mg total) by mouth 2 (two) times daily.  [name] Kitchen atorvastatin (LIPITOR) 40 MG tablet Take 1 tablet (40 mg total) by mouth daily at 6 PM.  . blood glucose meter kit and supplies KIT Dispense based on patient and insurance preference. Use up to four times daily as directed. (FOR ICD-9 250.00, 250.01).  [name] Kitchen buPROPion (WELLBUTRIN SR) 100 MG 12 hr tablet TAKE 1 TABLET (100 MG TOTAL) BY MOUTH 2 (TWO) TIMES DAILY.  . carvedilol (COREG) 25 MG tablet Take 1 tablet (25 mg total) by mouth 2 (two) times daily with a meal.  . Continuous Blood Gluc Receiver (FREESTYLE LIBRE 14 DAY READER) DEVI 1 each by Does not apply route as needed.  . cyclobenzaprine (FLEXERIL) 10 MG tablet Take 10 mg by mouth 3 (three) times daily as needed for muscle spasms.  . ferrous sulfate 325 (65 FE) MG tablet Take 325 mg by mouth in the morning and at bedtime.  . furosemide (LASIX) 40 MG tablet Take 2 tablets (80 mg total) by mouth 2 (two) times daily.  [name] Kitchen gabapentin (NEURONTIN) 600 MG tablet Take 600 mg by mouth 3 (three) times daily.  [name] Kitchen glipiZIDE (GLUCOTROL) 10 MG tablet Take 1 tablet (10 mg total) by mouth 2 (two) times daily before a meal.  . hydrALAZINE (APRESOLINE) 25 MG tablet Take 1.5 tablets (37.5 mg total) by mouth 3 (three) times daily.  . insulin glargine (LANTUS) 100 UNIT/ML injection Inject 0.2 mLs (20 Units total) into the skin daily.  . Insulin Lispro Prot & Lispro (HUMALOG MIX 75/25 KWIKPEN) (75-25) 100 UNIT/ML Kwikpen Inject 30 Units into the skin 2 (two) times daily.   . isosorbide mononitrate (IMDUR) 30 MG 24 hr tablet Take 0.5 tablets (15 mg total) by mouth daily.  . metFORMIN (GLUCOPHAGE) 500 MG tablet Take 1 tablet (500 mg total) by mouth 2 (two) times daily with a meal.  . Multiple Vitamins-Minerals (CENTRUM SILVER 50+MEN) TABS Take 1 tablet by mouth daily.  . pantoprazole (PROTONIX) 40 MG tablet TAKE 1 TABLET (40 MG TOTAL) BY MOUTH 2 (TWO) TIMES DAILY.  [name] Kitchen QUEtiapine (SEROQUEL) 300 MG tablet Take 300 mg by mouth at bedtime.  . sacubitril-valsartan (ENTRESTO) 97-103 MG Take 1 tablet by mouth 2 (two) times daily.  . sildenafil (VIAGRA) 100 MG tablet Take 1 tablet (100 mg total) by mouth  daily as needed for erectile dysfunction.  [name] Kitchen spironolactone (ALDACTONE) 25 MG tablet Take 1 tablet (25 mg total) by mouth daily.  [name] Kitchen tetrahydrozoline 0.05 % ophthalmic solution Place 1 drop into both eyes daily.  . TRUEPLUS PEN NEEDLES 31G X 6 MM MISC USE AS DIRECTED  . [DISCONTINUED] potassium chloride SA (KLOR-CON M20) 20 MEQ tablet Take 1 tablet (20 mEq total) by mouth daily. (Patient taking differently: Take 20 mEq by mouth 2 (two) times daily.)   BP 124/80   Pulse 75   Wt (!) 145 kg (319 lb 9.6 oz)   SpO2 95%   BMI 41.03 kg/m    Wt Readings from Last 3 Encounters:  12/28/20 (!) 145 kg (319 lb 9.6 oz)  12/21/20 (!) 145.6 kg (321 lb)  12/08/20 (!) 146.4 kg (322 lb 12.8 oz)   Vitals:   12/28/20 1427  BP: 124/80  Pulse: 75  SpO2: 95%   PHYSICAL EXAM General:  NAD. No resp difficulty HEENT: Normal Neck: Supple. No JVD. Carotids 2+ bilat; no bruits. No lymphadenopathy or thryomegaly appreciated. Cor: PMI nondisplaced. Regular rate & rhythm. No rubs, gallops or murmurs. Lungs: Clear Abdomen: Obese, soft, nontender, nondistended. No hepatosplenomegaly. No bruits or masses. Good bowel sounds. Extremities: No cyanosis, clubbing, rash, edema Neuro: alert & oriented x 3, cranial nerves grossly intact. Moves all 4 extremities w/o difficulty. Affect  pleasant.  Assessment/Plan: 1. Chronic systolic CHF: Echo in 11/20 with EF 25-30%, echo 5/21 with EF 30% with mildly decreased RV systolic function. Biotronik ICD.  Suspect mixed ischemic/nonischemic cardiomyopathy.  He is not volume overloaded on exam today, NYHA class II symptoms. - Continue Lasix 80 mg bid.  - Continue Entresto 97/103mg   bid.  - Continue carvedilol to 25 mg bid.  - Continue spironolactone 25 daily.   - Continue Bidil 1 tab tid.  - No SGLT2 inhibitor yet with hgb a1c > 11.  - BMET today. 2. CAD: Last cath in 11/20 with occluded OM2 stent, 90% D1, severe diffuse disease in the RCA (no intervention). No recent chest pain.  - No ASA given apixaban use.  - Continue atorvastatin. Good lipids in 8/21.  3. Atrial fibrillation: Paroxysmal.  He is regular rhythm today by exam. Denies overt bleeding.  - Continue apixaban 5 mg bid.  4. DMII: Continue home regimen. When a1c decreases, hopeful to add SGLT2i. 5. OSA: Continue CPAP => needs to use more regularly. Will work on getting him a new filter. Will reach out to paramedicine. 6. Upper GI bleeding: Occurred in 7/21.  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  No overt bleeding.   7. Obesity: Weight down 3 lbs today.  - Restart working out at [employer].   - [facility]  8. ED: Taking Viagra weekly. Counseled on holding daily dose of Imdur if he is going to use Viagra that day.  - Will follow up with Paramedicine to make sure he knows which pills in his pillbox are his Imdur tablets.  Follow up with Dr. Shirlee Latch in 3-4 months.  [facility] FNP-BC 12/28/2020

## 2020-12-27 ENCOUNTER — Telehealth (HOSPITAL_COMMUNITY): Payer: Self-pay | Admitting: Licensed Clinical Social Worker

## 2020-12-27 ENCOUNTER — Telehealth (HOSPITAL_COMMUNITY): Payer: Self-pay

## 2020-12-27 MED FILL — METFORMIN HCL 500 MG TABS: 500 | 30 days supply | Qty: 60 | Fill #3

## 2020-12-27 NOTE — Telephone Encounter (Signed)
Community Paramedic requesting help get Eliquis samples for patient while they await patient assistance application for BMS to be processed.  CSW provided paramedic with 4 boxes of samples  Will continue to follow and assist as needed  [name], [place] [facility] Desk#: [phone] Cell#: [phone]

## 2020-12-27 NOTE — Telephone Encounter (Signed)
Spoke to [place] where we confirmed his appointment for tomorrow and set up his transportation through [facility]. Pick up time is 1:33. Patient made aware by me and by [facility] transport representative.   I also picked up Chris's medications from [place] [facility] HF clinic. I went by and filled up pill box with medications that were missing from last visit.   Call/visit complete.

## 2020-12-28 ENCOUNTER — Ambulatory Visit (HOSPITAL_COMMUNITY)
Admission: RE | Admit: 2020-12-28 | Discharge: 2020-12-28 | Disposition: A | Discharge status: Home / Self Care | Payer: Medicare Other | Source: Ambulatory Visit | Attending: Family Medicine | Admitting: Family Medicine

## 2020-12-28 ENCOUNTER — Encounter (HOSPITAL_COMMUNITY): Payer: Self-pay

## 2020-12-28 ENCOUNTER — Other Ambulatory Visit: Payer: Self-pay

## 2020-12-28 VITALS — BP 124/80 | HR 75 | Wt 319.6 lb

## 2020-12-28 DIAGNOSIS — I110 Hypertensive heart disease with heart failure: Secondary | ICD-10-CM | POA: Insufficient documentation

## 2020-12-28 DIAGNOSIS — Z87891 Personal history of nicotine dependence: Secondary | ICD-10-CM | POA: Diagnosis not present

## 2020-12-28 DIAGNOSIS — E1165 Type 2 diabetes mellitus with hyperglycemia: Secondary | ICD-10-CM | POA: Diagnosis not present

## 2020-12-28 DIAGNOSIS — Z7901 Long term (current) use of anticoagulants: Secondary | ICD-10-CM | POA: Insufficient documentation

## 2020-12-28 DIAGNOSIS — I480 Paroxysmal atrial fibrillation: Secondary | ICD-10-CM

## 2020-12-28 DIAGNOSIS — Z955 Presence of coronary angioplasty implant and graft: Secondary | ICD-10-CM | POA: Diagnosis not present

## 2020-12-28 DIAGNOSIS — E669 Obesity, unspecified: Secondary | ICD-10-CM | POA: Diagnosis not present

## 2020-12-28 DIAGNOSIS — G4733 Obstructive sleep apnea (adult) (pediatric): Secondary | ICD-10-CM | POA: Diagnosis not present

## 2020-12-28 DIAGNOSIS — E785 Hyperlipidemia, unspecified: Secondary | ICD-10-CM | POA: Insufficient documentation

## 2020-12-28 DIAGNOSIS — I2510 Atherosclerotic heart disease of native coronary artery without angina pectoris: Secondary | ICD-10-CM | POA: Diagnosis not present

## 2020-12-28 DIAGNOSIS — Z8719 Personal history of other diseases of the digestive system: Secondary | ICD-10-CM

## 2020-12-28 DIAGNOSIS — Z794 Long term (current) use of insulin: Secondary | ICD-10-CM | POA: Diagnosis not present

## 2020-12-28 DIAGNOSIS — Z9989 Dependence on other enabling machines and devices: Secondary | ICD-10-CM

## 2020-12-28 DIAGNOSIS — I5022 Chronic systolic (congestive) heart failure: Secondary | ICD-10-CM

## 2020-12-28 DIAGNOSIS — E119 Type 2 diabetes mellitus without complications: Secondary | ICD-10-CM | POA: Insufficient documentation

## 2020-12-28 DIAGNOSIS — N529 Male erectile dysfunction, unspecified: Secondary | ICD-10-CM

## 2020-12-28 DIAGNOSIS — Z79899 Other long term (current) drug therapy: Secondary | ICD-10-CM | POA: Insufficient documentation

## 2020-12-28 LAB — BASIC METABOLIC PANEL
Anion gap: 10 (ref 5–15)
BUN: 19 mg/dL (ref 6–20)
CO2: 25 mmol/L (ref 22–32)
Calcium: 9.5 mg/dL (ref 8.9–10.3)
Chloride: 105 mmol/L (ref 98–111)
Creatinine, Ser: 1.13 mg/dL (ref 0.61–1.24)
GFR, Estimated: >60 mL/min (ref >60)
Glucose, Bld: 181 mg/dL — ABNORMAL HIGH (ref 70–99)
Potassium: 3.9 mmol/L (ref 3.5–5.1)
Sodium: 140 mmol/L (ref 135–145)

## 2020-12-28 NOTE — Patient Instructions (Addendum)
Labs done today, your results will be available in MyChart, we will contact you for abnormal readings.  You have a dentist appt February 15th at [facility] with Lannie Fields DDS, Phone: [phone], Address: [address], [place], [place], [place] [address]  Your physician recommends that you schedule a follow-up appointment in: 4 months  If you have any questions or concerns before your next appointment please send [place] a message through [facility] or call our office at [phone].    TO LEAVE A MESSAGE FOR THE NURSE SELECT OPTION 2, PLEASE LEAVE A MESSAGE INCLUDING: . YOUR NAME . DATE OF BIRTH . CALL BACK NUMBER . REASON FOR CALL**this is important as we prioritize the call backs  YOU WILL RECEIVE A CALL BACK THE SAME DAY AS LONG AS YOU CALL BEFORE 4:00 PM

## 2020-12-28 NOTE — Progress Notes (Addendum)
Community Paramedic helping to find in Database administrator for pt.  Pt has dental insurance through [facility]- able to get pt appt at [facility] in [place]- appt information provided to pt, paramedic, and added to AVS.    Dentist appt February 15th at [facility] with Lannie Fields DDS, Phone: [phone], Address: [address] [place], [place], [place] [address]   Will continue to follow and assist as needed  [name], [place] [facility] Desk#: [phone] Cell#: [phone]

## 2020-12-29 ENCOUNTER — Other Ambulatory Visit: Payer: Self-pay

## 2020-12-29 MED FILL — QUETIAPINE FUMARATE 300 MG: 300 | 30 days supply | Qty: 30 | Fill #0

## 2020-12-31 ENCOUNTER — Telehealth (HOSPITAL_COMMUNITY): Payer: Self-pay | Admitting: Pharmacy Technician

## 2020-12-31 NOTE — Telephone Encounter (Signed)
Received communication from [employer] that they are missing the patient's insurance information. The patient does have different insurance now, [facility]. Previously he has [employer] D. I provided the insurance information and sent the physical card in.  Will follow up.

## 2021-01-04 ENCOUNTER — Telehealth (HOSPITAL_COMMUNITY): Payer: Self-pay | Admitting: Licensed Clinical Social Worker

## 2021-01-04 ENCOUNTER — Other Ambulatory Visit (HOSPITAL_COMMUNITY): Payer: Self-pay

## 2021-01-04 NOTE — Telephone Encounter (Signed)
[facility] arranged to take pt to dentist appt next week- no further needs at this time  [name], [place] [facility] Desk#: [phone] Cell#: [phone]

## 2021-01-04 NOTE — Progress Notes (Signed)
Paramedicine Encounter    Patient ID: [name], male    DOB: [birth year], 60 y.o.   MRN: [id number]   Patient Care Team: Azzie Glatter, FNP as PCP - General (Family Medicine) [name], MD as PCP - Cardiology (Cardiology) [name], MD as PCP - Advanced Heart Failure (Cardiology) [name], MD as PCP - Electrophysiology (Cardiology) Valente David, RN as [place] Management  Patient Active Problem List   Diagnosis Date Noted  . 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 Acute blood loss anemia   . Angiodysplasia of stomach   . Chronic anticoagulation   . CHF exacerbation ([place]) 06/15/2020  . AKI (acute kidney injury) ([place]) 06/15/2020  . CKD (chronic kidney disease), stage III ([place]) 06/15/2020  . Anemia due to chronic blood loss   . Gastric AVM   . Angiodysplasia of small intestine ([place])   . Acute on chronic systolic (congestive) heart failure ([place]) 04/05/2020  . Anemia 04/05/2020  . PAF (paroxysmal atrial fibrillation) ([place]) 04/05/2020  . OSA on CPAP 04/05/2020  . Syncope and collapse 04/05/2020  . Type 2 diabetes mellitus with hyperglycemia, with long-term current use of insulin ([place]) 12/03/2019  . Diabetic polyneuropathy associated with type 2 diabetes mellitus ([place]) 12/03/2019  . Hyperglycemia 12/03/2019  . History of hyperglycemia 12/03/2019  . Erectile dysfunction 12/03/2019  . CHF (congestive heart failure) ([place]) 10/11/2019  . Paranoid schizophrenia, chronic condition ([place]) 01/26/2015  . Severe recurrent major depressive disorder with psychotic features ([place]) 01/26/2015  . GAD (generalized anxiety disorder) 01/26/2015  . OCD (obsessive compulsive disorder) 01/26/2015  . Panic disorder without agoraphobia 01/26/2015  . Insomnia 01/26/2015    Current Outpatient Medications:  .  acetaminophen (TYLENOL) 500 MG tablet, Take 2 tablets (1,000 mg total) by mouth in the morning and at bedtime.,  Disp: 180 tablet, Rfl: 3 .  apixaban (ELIQUIS) 5 MG TABS tablet, Take 1 tablet (5 mg total) by mouth 2 (two) times daily., Disp: 180 tablet, Rfl: 3 .  atorvastatin (LIPITOR) 40 MG tablet, Take 1 tablet (40 mg total) by mouth daily at 6 PM., Disp: 90 tablet, Rfl: 3 .  blood glucose meter kit and supplies KIT, Dispense based on patient and insurance preference. Use up to four times daily as directed. (FOR ICD-9 250.00, 250.01)., Disp: 1 each, Rfl: 0 .  buPROPion (WELLBUTRIN SR) 100 MG 12 hr tablet, TAKE 1 TABLET (100 MG TOTAL) BY MOUTH 2 (TWO) TIMES DAILY., Disp: 60 tablet, Rfl: 6 .  carvedilol (COREG) 25 MG tablet, Take 1 tablet (25 mg total) by mouth 2 (two) times daily with a meal., Disp: 60 tablet, Rfl: 3 .  Continuous Blood Gluc Receiver (FREESTYLE LIBRE 14 DAY READER) DEVI, 1 each by Does not apply route as needed., Disp: 1 each, Rfl: 11 .  cyclobenzaprine (FLEXERIL) 10 MG tablet, Take 10 mg by mouth 3 (three) times daily as needed for muscle spasms., Disp: , Rfl:  .  ferrous sulfate 325 (65 FE) MG tablet, Take 325 mg by mouth in the morning and at bedtime., Disp: , Rfl:  .  furosemide (LASIX) 40 MG tablet, Take 2 tablets (80 mg total) by mouth 2 (two) times daily., Disp: 360 tablet, Rfl: 3 .  gabapentin (NEURONTIN) 600 MG tablet, Take 600 mg by mouth 3 (three) times daily., Disp: , Rfl:  .  glipiZIDE (GLUCOTROL) 10 MG tablet, Take 1 tablet (10 mg total) by mouth 2 (two) times daily before a meal., Disp:  60 tablet, Rfl: 6 .  hydrALAZINE (APRESOLINE) 25 MG tablet, Take 1.5 tablets (37.5 mg total) by mouth 3 (three) times daily., Disp: 270 tablet, Rfl: 3 .  insulin glargine (LANTUS) 100 UNIT/ML injection, Inject 0.2 mLs (20 Units total) into the skin daily., Disp: 30 mL, Rfl: 12 .  Insulin Lispro Prot & Lispro (HUMALOG MIX 75/25 KWIKPEN) (75-25) 100 UNIT/ML Kwikpen, Inject 30 Units into the skin 2 (two) times daily., Disp: 15 mL, Rfl: 11 .  isosorbide mononitrate (IMDUR) 30 MG 24 hr tablet, Take 0.5  tablets (15 mg total) by mouth daily., Disp: 45 tablet, Rfl: 3 .  metFORMIN (GLUCOPHAGE) 500 MG tablet, Take 1 tablet (500 mg total) by mouth 2 (two) times daily with a meal., Disp: 60 tablet, Rfl: 6 .  Multiple Vitamins-Minerals (CENTRUM SILVER 50+MEN) TABS, Take 1 tablet by mouth daily., Disp: , Rfl:  .  pantoprazole (PROTONIX) 40 MG tablet, TAKE 1 TABLET (40 MG TOTAL) BY MOUTH 2 (TWO) TIMES DAILY., Disp: 60 tablet, Rfl: 1 .  QUEtiapine (SEROQUEL) 300 MG tablet, Take 300 mg by mouth at bedtime., Disp: , Rfl:  .  sacubitril-valsartan (ENTRESTO) 97-103 MG, Take 1 tablet by mouth 2 (two) times daily., Disp: 60 tablet, Rfl: 5 .  sildenafil (VIAGRA) 100 MG tablet, Take 1 tablet (100 mg total) by mouth daily as needed for erectile dysfunction., Disp: 30 tablet, Rfl: 2 .  spironolactone (ALDACTONE) 25 MG tablet, Take 1 tablet (25 mg total) by mouth daily., Disp: 30 tablet, Rfl: 11 .  tetrahydrozoline 0.05 % ophthalmic solution, Place 1 drop into both eyes daily., Disp: , Rfl:  .  TRUEPLUS PEN NEEDLES 31G X 6 MM MISC, USE AS DIRECTED, Disp: 100 each, Rfl: 0 No Known Allergies   Social History   Socioeconomic History  . Marital status: Legally Separated    Spouse name: Not on file  . Number of children: Not on file  . Years of education: Not on file  . Highest education level: Not on file  Occupational History  . Not on file  Tobacco Use  . 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 Smokeless tobacco: Never Used  Vaping Use  . Vaping Use: Never used  Substance and Sexual Activity  . Alcohol use: Yes    Alcohol/week: 2.0 standard drinks    Types: 2 Cans of beer per week  . Drug use: No  . Sexual activity: Yes  Other Topics Concern  . Not on file  Social History Narrative  . Not on file   Social Determinants of Health   Financial Resource Strain: Not on file  Food Insecurity: No Food Insecurity  . Worried About Charity fundraiser in the Last Year: Never true  . Ran Out of Food in the Last  Year: Never true  Transportation Needs: Unmet Transportation Needs  . Lack of Transportation (Medical): Yes  . Lack of Transportation (Non-Medical): Yes  Physical Activity: Not on file  Stress: Not on file  Social Connections: Not on file  Intimate Partner Violence: Not on file    Physical Exam Vitals reviewed.  Constitutional:      Appearance: He is normal weight.  HENT:     Head: Normocephalic.     Nose: Nose normal.     Mouth/Throat:     Mouth: Mucous membranes are moist.     Pharynx: Oropharynx is clear.  Eyes:     Conjunctiva/sclera: Conjunctivae normal.     Pupils: Pupils are equal, round, and reactive to  light.  Cardiovascular:     Rate and Rhythm: Normal rate and regular rhythm.     Pulses: Normal pulses.     Heart sounds: Normal heart sounds.  Abdominal:     Palpations: Abdomen is soft.  Musculoskeletal:        General: Normal range of motion.     Cervical back: Normal range of motion.     Right lower leg: No edema.     Left lower leg: No edema.  Skin:    General: Skin is warm and dry.     Capillary Refill: Capillary refill takes less than 2 seconds.  Neurological:     General: No focal deficit present.     Mental Status: He is alert. Mental status is at baseline.  Psychiatric:        Mood and Affect: Mood normal.     Arrived for home visit for [facility] who was alert and oriented ambulating around his home. [name] reports he has been feeling good but feeling a little short of breath more so today. [name] admitted to eating some extra salt over the last few days. [name]'s vitals were obtained and his BP was elevated however he just took his medications prior to me coming. I educated [name] the importance of taking his medications on time as they are prescribed and we went over times to take his medications. I reviewed medications and confirmed same filling pill box accordingly. [name] had no swelling, lung sounds clear with no JVD. [name] and I reviewed appointments and  confirmed transportation for same. Dentist coming up on Feb. 15th and [place] set up transport for same. I called [place] in regards to [facility] and they informed me it was ZERO $ copay and would be ready for pick up today. [name] was excited about same and plans to pick up same tomorrow. [name] knew to contact me if he had any questions. Home visit complete. I will see [name] in two weeks.   Refills: Seroquel Gabapentin      Future Appointments  Date Time Provider [place]  01/10/2021 10:30 AM Valente David, RN [facility] None  01/19/2021  9:00 AM Azzie Glatter, FNP [facility] None  03/04/2021  7:40 AM [facility] DEVICE REMOTES [facility]  03/14/2021  9:00 AM Azzie Glatter, FNP [facility] None  03/29/2021  2:20 PM [name], MD [facility] None  06/03/2021  7:40 AM [facility] DEVICE REMOTES [facility]  09/02/2021  7:40 AM [facility] DEVICE REMOTES [facility]  12/02/2021  7:40 AM [facility] DEVICE REMOTES [facility]  03/03/2022  7:40 AM [facility] DEVICE REMOTES [facility]     ACTION: Home visit completed Next visit planned for two weeks

## 2021-01-05 MED FILL — GABAPENTIN 300 MG CAPSULE: 300 | 30 days supply | Qty: 180 | Fill #1

## 2021-01-05 MED FILL — QUETIAPINE FUMARATE 300 MG: 300 | 30 days supply | Qty: 30 | Fill #0

## 2021-01-06 ENCOUNTER — Telehealth (HOSPITAL_COMMUNITY): Payer: Self-pay

## 2021-01-06 NOTE — Telephone Encounter (Signed)
Spoke to [place] who got his [employer]. I went out to the house and successfully programmed same and downloaded the app for his phone as well and he was able to successfully scan for glucose reading.   I will follow up in two weeks.

## 2021-01-10 ENCOUNTER — Other Ambulatory Visit: Payer: Self-pay | Admitting: *Deleted

## 2021-01-10 NOTE — Patient Outreach (Signed)
[facility]) Care Management  01/10/2021  [name] 1961-09-09 [id number]   Outgoing call placed to member, state he is doing well.  Blood sugars have ranged from a low of 130 to a high of 170 over the last few weeks.  He now has a [employer] but report having trouble with the device staying in place, particularly during the night.  He will ask to see if he can put it in a different place other than his arm and still get an accurate reading.  Next office visit with PCP is 2/23, will plan to have new A1C drawn at that time.    He was seen by [facility] on 2/1, no changes made, will follow up on 5/3.  EMT from paramedicine team was in the home on 2/8, will return tomorrow.  Daily weights are reported stable, 317-319 pounds, denies any shortness of breath.  [facility] but state his visits will be in [place]. He will follow up with his transportation resource and confirm they will take him to that area prior to scheduling visit.  He has also restarted a routine at the [facility], will continue this for weight loss as well.    Denies any urgent concerns, encouraged to contact this care manager with questions.  Agrees to follow up within the next month.  Goals Addressed            This Visit's Progress   . Monitor and Manage My Blood Sugar-Diabetes Type 2       Timeframe:  Long-Range Goal Priority:  High Start Date:            2/14                 Expected End Date:   4/14                    Follow Up Date 02/07/2021    - check blood sugar at prescribed times - enter blood sugar readings and medication or insulin into daily log    Why is this important?    Checking your blood sugar at home helps to keep it from getting very high or very low.   Writing the results in a diary or log helps the doctor know how to care for you.   Your blood sugar log should have the time, date and the results.   Also,  write down the amount of insulin or other medicine that you take.   Other information, like what you ate, exercise done and how you were feeling, will also be helpful.     Notes:     . [facility] - Make and Keep All Appointments   On track    Follow Up Date 02/07/2021  Timeframe:  Short-Term Goal Priority:  Medium Start Date:    2/14                     Expected End Date:       02/07/2021                  - ask family or friend for a ride - call to cancel if needed - keep a calendar with appointment dates    Why is this important?   Part of staying healthy is seeing the doctor for follow-up care.  If you forget your appointments, there are some things you can do to  stay on track.    Notes:   11/24 - Upcoming appointments reviewed, encouraged to keep all dates  12/22 - Reminded of importance of attending all MD appointments  2/14 - Appointments reviewed, advised to secure transportation in effort to decrease risk of no show     . COMPLETED: [facility] - Track and Manage Activity and Exertion       Follow Up Date 01/10/2021  Timeframe:  Long-Range Goal Priority:  High Start Date:     11/17/2020                        Expected End Date:       01/18/2021                  - drink water to stay hydrated during exercise - follow activity or exercise plan - make an activity or exercise plan - pace activity allowing for rest - track symptoms during activity in diary    Why is this important?   Exercising is very important when managing your heart failure.  It will help your heart get stronger.    Notes:   11/24 - Activity levels reviewed, encouraged to continue to increase as tolerated.  Daily weights reviewed  12/22 - Reminded to monitor weight, BP, and CBG daily and record trends       Valente David, Therapist, sports, MSN [place] [employer] [phone]

## 2021-01-10 NOTE — Patient Instructions (Signed)
Diabetes Mellitus and Exercise Exercising regularly is important for overall health, especially for people who have diabetes mellitus. Exercising is not only about losing weight. It has many other health benefits, such as increasing muscle strength and bone density and reducing body fat and stress. This leads to improved fitness, flexibility, and endurance, all of which result in better overall health. What are the benefits of exercise if I have diabetes? Exercise has many benefits for people with diabetes. They include:  Helping to lower and control blood sugar (glucose).  Helping the body to respond better to the hormone insulin by improving insulin sensitivity.  Reducing how much insulin the body needs.  Lowering the risk for heart disease by: ? Lowering "bad" cholesterol and triglyceride levels. ? Increasing "good" cholesterol levels. ? Lowering blood pressure. ? Lowering blood glucose levels. What is my activity plan? Your health care provider or certified diabetes educator can help you make a plan for the type and frequency of exercise that works for you. This is called your activity plan. Be sure to:  Get at least 150 minutes of medium-intensity or high-intensity exercise each week. Exercises may include brisk walking, biking, or water aerobics.  Do stretching and strengthening exercises, such as yoga or weight lifting, at least 2 times a week.  Spread out your activity over at least 3 days of the week.  Get some form of physical activity each day. ? Do not go more than 2 days in a row without some kind of physical activity. ? Avoid being inactive for more than 90 minutes at a time. Take frequent breaks to walk or stretch.  Choose exercises or activities that you enjoy. Set realistic goals.  Start slowly and gradually increase your exercise intensity over time.   How do I manage my diabetes during exercise? Monitor your blood glucose  Check your blood glucose before and  after exercising. If your blood glucose is: ? 240 mg/dL (13.3 mmol/L) or higher before you exercise, check your urine for ketones. These are chemicals created by the liver. If you have ketones in your urine, do not exercise until your blood glucose returns to normal. ? 100 mg/dL (5.6 mmol/L) or lower, eat a snack containing 15-20 grams of carbohydrate. Check your blood glucose 15 minutes after the snack to make sure that your glucose level is above 100 mg/dL (5.6 mmol/L) before you start your exercise.  Know the symptoms of low blood glucose (hypoglycemia) and how to treat it. Your risk for hypoglycemia increases during and after exercise. Follow these tips and your health care provider's instructions  Keep a carbohydrate snack that is fast-acting for use before, during, and after exercise to help prevent or treat hypoglycemia.  Avoid injecting insulin into areas of the body that are going to be exercised. For example, avoid injecting insulin into: ? Your arms, when you are about to play tennis. ? Your legs, when you are about to go jogging.  Keep records of your exercise habits. Doing this can help you and your health care provider adjust your diabetes management plan as needed. Write down: ? Food that you eat before and after you exercise. ? Blood glucose levels before and after you exercise. ? The type and amount of exercise you have done.  Work with your health care provider when you start a new exercise or activity. He or she may need to: ? Make sure that the activity is safe for you. ? Adjust your insulin, other medicines, and food that   you eat.  Drink plenty of water while you exercise. This prevents loss of water (dehydration) and problems caused by a lot of heat in the body (heat stroke).   Where to find more information  American Diabetes Association: www.diabetes.org Summary  Exercising regularly is important for overall health, especially for people who have diabetes  mellitus.  Exercising has many health benefits. It increases muscle strength and bone density and reduces body fat and stress. It also lowers and controls blood glucose.  Your health care provider or certified diabetes educator can help you make an activity plan for the type and frequency of exercise that works for you.  Work with your health care provider to make sure any new activity is safe for you. Also work with your health care provider to adjust your insulin, other medicines, and the food you eat. This information is not intended to replace advice given to you by your health care provider. Make sure you discuss any questions you have with your health care provider. Document Revised: 08/11/2019 Document Reviewed: 08/11/2019 Elsevier Patient Education  2021 [place] With Diabetes Diabetes (type 1 diabetes mellitus or type 2 diabetes mellitus) is a condition in which the body does not have enough of a hormone called insulin, or the body does not respond properly to insulin. Normally, insulin allows sugars (glucose) to enter cells in the body. With diabetes, extra glucose builds up in the blood instead of going into cells. This results in high blood glucose (hyperglycemia). How to manage lifestyle changes Managing diabetes includes medical treatments as well as lifestyle changes. If diabetes is not managed well, serious physical and emotional complications can occur. Taking good care of yourself means that you are responsible for:  Monitoring glucose regularly.  Eating a healthy diet.  Exercising regularly.  Meeting with health care providers.  Taking medicines as directed. Most people feel some stress about managing their diabetes. When this stress becomes too much, it is known as diabetes-related distress. This is very common. Living with diabetes can place you at risk for diabetes distress, depression, or anxiety. These disorders can make diabetes more difficult  to manage. How to recognize stress You may have diabetes distress if you:  Avoid or ignore your daily diabetes care. This includes glucose testing, following a meal plan, and taking medications.  Feel overwhelmed by your daily diabetes care.  Experience emotional reactions such as anger, sadness, or fear related to your daily diabetes care.  Feel fear or shame about not doing everything perfectly that you have been told to do. Emotional distress Symptoms of diabetes distress include:  Anger about having a diagnosis of diabetes.  Fear or frustration about your diagnosis and the changes you need to make to manage the condition.  Being overly worried about the care that you need or the cost of the care that you need.  Feeling like you caused your condition by doing something wrong.  Fear about unpredictable fluctuations in your blood glucose, like low or high blood glucose.  Feeling judged by your health care providers.  Feeling very alone with the disease. Depression Having diabetes means that you are at a higher risk for depression. Your health care provider may test (screen) you for symptoms of depression. It is important to recognize symptoms and to start treatment for depression soon after it is diagnosed. The following are some symptoms of depression:  Loss of interest in things that you used to enjoy.  Feeling depressed much or most of the time.  A change in appetite.  Trouble getting to sleep or staying asleep.  Feeling tired most of the day.  Feeling nervous and anxious.  Feeling guilty and worrying that you are a burden to others.  Having thoughts of hurting yourself or feeling that you want to die. If you have any of these symptoms, more days than not, for 2 weeks or longer, you may have depression. This would be a good time to contact your health care provider. Follow these instructions at home: Managing diabetes distress The following are some ways to manage  emotional distress:  Learn as much as you can about diabetes and its treatment. Take one step at a time to improve your management.  Meet with a certified diabetes care and education specialist. Take a class to learn how to manage your condition.  Consider working with a counselor or therapist.  Keep a journal of your thoughts and concerns.  Accept that some things are out of your control.  Talk with other people who have diabetes. It can help to talk about the distress that you feel.  Find ways to manage stress that work for you. These may include art or music therapy, exercise, meditation, and hobbies.  Seek support from spiritual leaders, family, and friends.   General instructions  Do your best to follow your diabetes management plan.  If you are struggling to follow your plan, talk with a certified diabetes care and education specialist, or with someone else who has diabetes. They may have ideas that will help.  Forgive yourself for not being perfect. Almost everyone struggles with the tasks of diabetes.  Keep all follow-up visits. This is important. Where to find support  Search for information and support from the American Diabetes Association: www.diabetes.org  Find a certified diabetes education and care specialist. Make an appointment through the Association of Diabetes Care & Education Specialists: www.diabeteseducator.org Contact a health care provider if:  You believe your diabetes is getting out of control.  You are concerned you may be depressed.  You think your medications are not helping control your diabetes.  You are feeling overwhelmed with your diabetes. Get help right away if:  You have thoughts about hurting yourself or others. If you ever feel like you may hurt yourself or others, or have thoughts about taking your own life, get help right away. You can go to your nearest emergency department or call:  Your local emergency services (911 in the  [place]  A suicide crisis helpline, such as the [place] at (317) 235-2218. This is open 24 hours a day. Summary  Diabetes (type 1 diabetes mellitus or type 2 diabetes mellitus) is a condition in which the body does not have enough of a hormone called insulin, or the body does not respond properly to insulin.  Living with diabetes puts you at risk for medical and emotional issues, such as diabetes distress, depression, and anxiety.  Recognizing the symptoms of diabetes distress and depression may help you avoid problems with your diabetes control. If you experience symptoms, it is important to discuss this with your health care provider, certified diabetes care and education specialist, or therapist.  It is important to start treatment for diabetes distress and depression soon after diagnosis.  Ask your health care provider to recommend a therapist who understands both depression and diabetes. This information is not intended to replace advice given to you by your health care provider. Make  sure you discuss any questions you have with your health care provider. Document Revised: 03/25/2020 Document Reviewed: 03/25/2020 Elsevier Patient Education  [place] Addressed            This Visit's Progress   . Monitor and Manage My Blood Sugar-Diabetes Type 2       Timeframe:  Long-Range Goal Priority:  High Start Date:            2/14                 Expected End Date:   4/14                    Follow Up Date 02/07/2021    - check blood sugar at prescribed times - enter blood sugar readings and medication or insulin into daily log    Why is this important?    Checking your blood sugar at home helps to keep it from getting very high or very low.   Writing the results in a diary or log helps the doctor know how to care for you.   Your blood sugar log should have the time, date and the results.   Also, write down the amount of insulin  or other medicine that you take.   Other information, like what you ate, exercise done and how you were feeling, will also be helpful.     Notes:     . [facility] - Make and Keep All Appointments   On track    Follow Up Date 02/07/2021  Timeframe:  Short-Term Goal Priority:  Medium Start Date:    2/14                     Expected End Date:       02/07/2021                  - ask family or friend for a ride - call to cancel if needed - keep a calendar with appointment dates    Why is this important?   Part of staying healthy is seeing the doctor for follow-up care.  If you forget your appointments, there are some things you can do to stay on track.    Notes:   11/24 - Upcoming appointments reviewed, encouraged to keep all dates  12/22 - Reminded of importance of attending all MD appointments  2/14 - Appointments reviewed, advised to secure transportation in effort to decrease risk of no show     . COMPLETED: [facility] - Track and Manage Activity and Exertion       Follow Up Date 01/10/2021  Timeframe:  Long-Range Goal Priority:  High Start Date:     11/17/2020                        Expected End Date:       01/18/2021                  - drink water to stay hydrated during exercise - follow activity or exercise plan - make an activity or exercise plan - pace activity allowing for rest - track symptoms during activity in diary    Why is this important?   Exercising is very important when managing your heart failure.  It will help your heart get stronger.    Notes:   11/24 - Activity levels reviewed, encouraged to continue to  increase as tolerated.  Daily weights reviewed  12/22 - Reminded to monitor weight, BP, and CBG daily and record trends

## 2021-01-11 ENCOUNTER — Telehealth (HOSPITAL_COMMUNITY): Payer: Self-pay | Admitting: Licensed Clinical Social Worker

## 2021-01-11 NOTE — Telephone Encounter (Signed)
Pt completed initial dental visit and now needs referral to oral surgeon but he states he has to identify in network oral surgeon himself and is requesting help.  CSW able to research through [facility] and identify in Engineer, maintenance ([employer])- provided this information to [employer] who will help pt follow up.  Will continue to follow and assist as needed  [name], [facility] Desk#: [phone] Cell#: [phone]

## 2021-01-17 ENCOUNTER — Telehealth (HOSPITAL_COMMUNITY): Payer: Self-pay

## 2021-01-17 NOTE — Telephone Encounter (Signed)
Spoke to [name] and made him aware of my absence and he agreed and understood and stated he could manage his medications until I return, he is aware to call clinic if a need arises. Call complete.

## 2021-01-18 NOTE — Telephone Encounter (Signed)
Called [employer] to check the status of the patient's application. Representative stated that the insurance information was received. The application is going through the insurance verification process.

## 2021-01-19 ENCOUNTER — Ambulatory Visit (INDEPENDENT_AMBULATORY_CARE_PROVIDER_SITE_OTHER): Payer: Medicare Other | Admitting: Family Medicine

## 2021-01-19 ENCOUNTER — Telehealth (HOSPITAL_COMMUNITY): Payer: Self-pay | Admitting: Pharmacist

## 2021-01-19 ENCOUNTER — Encounter: Payer: Self-pay | Admitting: Family Medicine

## 2021-01-19 ENCOUNTER — Other Ambulatory Visit: Payer: Self-pay | Admitting: Family Medicine

## 2021-01-19 ENCOUNTER — Other Ambulatory Visit: Payer: Self-pay

## 2021-01-19 VITALS — BP 171/66 | HR 103 | Temp 97.0°F | Resp 20 | Ht 74.0 in | Wt 322.0 lb

## 2021-01-19 DIAGNOSIS — Z09 Encounter for follow-up examination after completed treatment for conditions other than malignant neoplasm: Secondary | ICD-10-CM

## 2021-01-19 DIAGNOSIS — N529 Male erectile dysfunction, unspecified: Secondary | ICD-10-CM | POA: Diagnosis not present

## 2021-01-19 DIAGNOSIS — K029 Dental caries, unspecified: Secondary | ICD-10-CM

## 2021-01-19 DIAGNOSIS — R7303 Prediabetes: Secondary | ICD-10-CM | POA: Diagnosis not present

## 2021-01-19 DIAGNOSIS — E1165 Type 2 diabetes mellitus with hyperglycemia: Secondary | ICD-10-CM | POA: Diagnosis not present

## 2021-01-19 DIAGNOSIS — Z794 Long term (current) use of insulin: Secondary | ICD-10-CM | POA: Diagnosis not present

## 2021-01-19 DIAGNOSIS — E119 Type 2 diabetes mellitus without complications: Secondary | ICD-10-CM

## 2021-01-19 DIAGNOSIS — R0602 Shortness of breath: Secondary | ICD-10-CM

## 2021-01-19 DIAGNOSIS — R739 Hyperglycemia, unspecified: Secondary | ICD-10-CM

## 2021-01-19 DIAGNOSIS — F419 Anxiety disorder, unspecified: Secondary | ICD-10-CM

## 2021-01-19 LAB — POCT GLYCOSYLATED HEMOGLOBIN (HGB A1C): Hemoglobin A1C: 7.7 % — AB (ref 4.0–5.6)

## 2021-01-19 LAB — GLUCOSE, POCT (MANUAL RESULT ENTRY): POC Glucose: 228 mg/dl — AB (ref 70–99)

## 2021-01-19 MED ORDER — SILDENAFIL CITRATE 100 MG PO TABS
100.0000 mg | ORAL_TABLET | Freq: Every day | ORAL | 2 refills | Status: DC | PRN
Start: 2021-01-19 — End: 2021-03-08

## 2021-01-19 MED ORDER — ALBUTEROL SULFATE HFA 108 (90 BASE) MCG/ACT IN AERS: 2.0000 | INHALATION_SPRAY | Freq: Four times a day (QID) | RESPIRATORY_TRACT | 3 refills | Status: DC | PRN

## 2021-01-19 MED FILL — ALBUTEROL SULFATE HFA 108 (: 108 (90 BAS | 25 days supply | Qty: 18 | Fill #0

## 2021-01-19 NOTE — Progress Notes (Signed)
-  Needs RFs on all meds (to [facility] pharmacy) and Freestyle Libre sensors & Viagra (to [employer])

## 2021-01-19 NOTE — Progress Notes (Signed)
Patient [name]    Established Patient Office Visit  Subjective:  Patient ID: [name], male    DOB: 04/19/1961  Age: 60 y.o. MRN: [id number]  CC:  Chief Complaint  Patient presents with  . Follow-up    HPI [name] is  60 year old male who presents for Follow Up today.    Patient Active Problem List   Diagnosis Date Noted  . 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 Acute blood loss anemia   . Angiodysplasia of stomach   . Chronic anticoagulation   . CHF exacerbation ([place]) 06/15/2020  . AKI (acute kidney injury) ([place]) 06/15/2020  . CKD (chronic kidney disease), stage III ([place]) 06/15/2020  . Anemia due to chronic blood loss   . Gastric AVM   . Angiodysplasia of small intestine ([place])   . Acute on chronic systolic (congestive) heart failure ([place]) 04/05/2020  . Anemia 04/05/2020  . PAF (paroxysmal atrial fibrillation) ([place]) 04/05/2020  . OSA on CPAP 04/05/2020  . Syncope and collapse 04/05/2020  . Type 2 diabetes mellitus with hyperglycemia, with long-term current use of insulin ([place]) 12/03/2019  . Diabetic polyneuropathy associated with type 2 diabetes mellitus ([place]) 12/03/2019  . Hyperglycemia 12/03/2019  . History of hyperglycemia 12/03/2019  . Erectile dysfunction 12/03/2019  . CHF (congestive heart failure) ([place]) 10/11/2019  . Paranoid schizophrenia, chronic condition ([place]) 01/26/2015  . Severe recurrent major depressive disorder with psychotic features ([place]) 01/26/2015  . GAD (generalized anxiety disorder) 01/26/2015  . OCD (obsessive compulsive disorder) 01/26/2015  . Panic disorder without agoraphobia 01/26/2015  . Insomnia 01/26/2015    Current Status: Since his last office visit, he is doing well with no complaints.  He currently follows up with Psychiatrist at [facility] who prescribes Seroquel 300 mg daily. He denies suicidal ideations, homicidal ideations, or  auditory hallucinations. He denies visual changes, chest pain, cough, shortness of breath, heart palpitations, and falls. He has occasional headaches and dizziness with position changes. Denies severe headaches, confusion, seizures, double vision, and blurred vision, nausea and vomiting. His most recent normal range of preprandial blood glucose levels have been between 140-145. He has seen low range of 120 and high of 278 since his last office visit. He denies fatigue, frequent urination, blurred vision, excessive hunger, excessive thirst, weight gain, weight loss, and poor wound healing. He continues to check his feet regularly. He denies fevers, chills, recent infections, weight loss, and night sweats. Denies GI problems such as diarrhea, and constipation. He has no reports of blood in stools, dysuria and hematuria. He is taking all medications as prescribed. He denies pain today.   Past Medical History:  Diagnosis Date  . Anxiety   . CAD (coronary artery disease)   . CHF (congestive heart failure) ([place]) 09/2019  . Depression   . Diabetes mellitus   . Erectile dysfunction 11/2019  . H/O right heart catheterization 09/2019  . Hypertension   . Schizophrenia ([place])   . Sleep apnea    uses cpap    Past Surgical History:  Procedure Laterality Date  . CORONARY STENT PLACEMENT    . ENTEROSCOPY N/A 04/08/2020   Procedure: ENTEROSCOPY;  Surgeon: Doran Stabler, MD;  Location[facility] END[facility]COPY;  Service: Gastroenterology;  Laterality: N/A;  . ENTEROSCOPY N/A 06/17/2020   Procedure: ENTEROSCOPY;  Surgeon: Irene Shipper, MD;  Location: [facility] ENDOSCOPY;  Service: Endoscopy;  Laterality: N/A;  . HEMOSTASIS CLIP PLACEMENT  04/08/2020  Procedure: HEMOSTASIS CLIP PLACEMENT;  Surgeon: Doran Stabler, MD;  Location: [place];  Service: Gastroenterology;;  . HEMOSTASIS CONTROL  04/08/2020   Procedure: HEMOSTASIS CONTROL;  Surgeon: Doran Stabler, MD;  Location: [facility] ENDOSCOPY;  Service:  Gastroenterology;;  . Laretta Alstrom CONTROL  06/17/2020   Procedure: HEMOSTASIS CONTROL;  Surgeon: Irene Shipper, MD;  Location: [facility] ENDOSCOPY;  Service: Endoscopy;;  . HERNIA REPAIR    . ICD IMPLANT N/A 09/03/2020   Procedure: ICD IMPLANT;  Surgeon: [name], MD;  Location: [place] CV LAB;  Service: Cardiovascular;  Laterality: N/A;  . RIGHT/LEFT HEART CATH AND CORONARY ANGIOGRAPHY N/A 10/14/2019   Procedure: RIGHT/LEFT HEART CATH AND CORONARY ANGIOGRAPHY;  Surgeon: Belva Crome, MD;  Location: [place] CV LAB;  Service: Cardiovascular;  Laterality: N/A;    Family History  Problem Relation Age of Onset  . Heart failure Mother   . Mental illness Sister   . Mental illness Sister     Social History   Socioeconomic History  . Marital status: Legally Separated    Spouse name: Not on file  . Number of children: Not on file  . Years of education: Not on file  . Highest education level: Not on file  Occupational History  . Not on file  Tobacco Use  . 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 Smokeless tobacco: Never Used  Vaping Use  . Vaping Use: Never used  Substance and Sexual Activity  . Alcohol use: Yes    Alcohol/week: 2.0 standard drinks    Types: 2 Cans of beer per week  . Drug use: No  . Sexual activity: Yes  Other Topics Concern  . Not on file  Social History Narrative  . Not on file   Social Determinants of Health   Financial Resource Strain: Not on file  Food Insecurity: No Food Insecurity  . Worried About Charity fundraiser in the Last Year: Never true  . Ran Out of Food in the Last Year: Never true  Transportation Needs: Unmet Transportation Needs  . Lack of Transportation (Medical): Yes  . Lack of Transportation (Non-Medical): Yes  Physical Activity: Not on file  Stress: Not on file  Social Connections: Not on file  Intimate Partner Violence: Not on file    Outpatient Medications Prior to Visit  Medication Sig Dispense Refill  . acetaminophen  (TYLENOL) 500 MG tablet Take 2 tablets (1,000 mg total) by mouth in the morning and at bedtime. 180 tablet 3  . apixaban (ELIQUIS) 5 MG TABS tablet Take 1 tablet (5 mg total) by mouth 2 (two) times daily. 180 tablet 3  . atorvastatin (LIPITOR) 40 MG tablet Take 1 tablet (40 mg total) by mouth daily at 6 PM. 90 tablet 3  . blood glucose meter kit and supplies KIT Dispense based on patient and insurance preference. Use up to four times daily as directed. (FOR ICD-9 250.00, 250.01). 1 each 0  . buPROPion (WELLBUTRIN SR) 100 MG 12 hr tablet TAKE 1 TABLET (100 MG TOTAL) BY MOUTH 2 (TWO) TIMES DAILY. 60 tablet 6  . carvedilol (COREG) 25 MG tablet Take 1 tablet (25 mg total) by mouth 2 (two) times daily with a meal. 60 tablet 3  . Continuous Blood Gluc Receiver (FREESTYLE LIBRE 14 DAY READER) DEVI 1 each by Does not apply route as needed. 1 each 11  . cyclobenzaprine (FLEXERIL) 10 MG tablet Take 10 mg by mouth 3 (three) times daily as needed for  muscle spasms.    . ferrous sulfate 325 (65 FE) MG tablet Take 325 mg by mouth in the morning and at bedtime.    . furosemide (LASIX) 40 MG tablet Take 2 tablets (80 mg total) by mouth 2 (two) times daily. 360 tablet 3  . gabapentin (NEURONTIN) 600 MG tablet Take 600 mg by mouth 3 (three) times daily.    [name] Kitchen glipiZIDE (GLUCOTROL) 10 MG tablet Take 1 tablet (10 mg total) by mouth 2 (two) times daily before a meal. 60 tablet 6  . hydrALAZINE (APRESOLINE) 25 MG tablet Take 1.5 tablets (37.5 mg total) by mouth 3 (three) times daily. 270 tablet 3  . insulin glargine (LANTUS) 100 UNIT/ML injection Inject 0.2 mLs (20 Units total) into the skin daily. 30 mL 12  . Insulin Lispro Prot & Lispro (HUMALOG MIX 75/25 KWIKPEN) (75-25) 100 UNIT/ML Kwikpen Inject 30 Units into the skin 2 (two) times daily. 15 mL 11  . isosorbide mononitrate (IMDUR) 30 MG 24 hr tablet Take 0.5 tablets (15 mg total) by mouth daily. 45 tablet 3  . metFORMIN (GLUCOPHAGE) 500 MG tablet Take 1 tablet (500 mg  total) by mouth 2 (two) times daily with a meal. 60 tablet 6  . Multiple Vitamins-Minerals (CENTRUM SILVER 50+MEN) TABS Take 1 tablet by mouth daily.    . pantoprazole (PROTONIX) 40 MG tablet TAKE 1 TABLET (40 MG TOTAL) BY MOUTH 2 (TWO) TIMES DAILY. 60 tablet 1  . QUEtiapine (SEROQUEL) 300 MG tablet Take 300 mg by mouth at bedtime.    . sacubitril-valsartan (ENTRESTO) 97-103 MG Take 1 tablet by mouth 2 (two) times daily. 60 tablet 5  . spironolactone (ALDACTONE) 25 MG tablet Take 1 tablet (25 mg total) by mouth daily. 30 tablet 11  . tetrahydrozoline 0.05 % ophthalmic solution Place 1 drop into both eyes daily.    . TRUEPLUS PEN NEEDLES 31G X 6 MM MISC USE AS DIRECTED 100 each 0  . sildenafil (VIAGRA) 100 MG tablet Take 1 tablet (100 mg total) by mouth daily as needed for erectile dysfunction. 30 tablet 2   No facility-administered medications prior to visit.    No Known Allergies  ROS Review of Systems  Constitutional: Negative.   HENT: Negative.   Eyes: Negative.   Respiratory: Positive for shortness of breath (occasional).   Cardiovascular: Negative.   Gastrointestinal: Positive for abdominal distention (obese).  Endocrine: Negative.   Genitourinary: Negative.   Musculoskeletal: Positive for arthralgias (generalized joint pain).  Skin: Negative.   Allergic/Immunologic: Negative.   Neurological: Positive for dizziness (occasional ) and headaches (occasional ).  Hematological: Negative.   Psychiatric/Behavioral: Negative.       Objective:    Physical Exam Vitals and nursing note reviewed.  Constitutional:      Appearance: Normal appearance.  HENT:     Head: Normocephalic and atraumatic.     Nose: Nose normal.     Mouth/Throat:     Mouth: Mucous membranes are moist.     Pharynx: Oropharynx is clear.  Cardiovascular:     Rate and Rhythm: Normal rate and regular rhythm.     Pulses: Normal pulses.     Heart sounds: Normal heart sounds.  Pulmonary:     Effort: Pulmonary  effort is normal.     Breath sounds: Normal breath sounds.  Abdominal:     General: Bowel sounds are normal. There is distension (occasional ).     Palpations: Abdomen is soft.  Musculoskeletal:  General: Normal range of motion.     Cervical back: Normal range of motion and neck supple.  Skin:    General: Skin is warm and dry.  Neurological:     General: No focal deficit present.     Mental Status: He is alert and oriented to person, place, and time.  Psychiatric:        Mood and Affect: Mood normal.        Behavior: Behavior normal.        Thought Content: Thought content normal.        Judgment: Judgment normal.     BP (!) 171/66 (BP Location: Left Arm, Patient Position: Sitting, Cuff Size: Large)   Pulse (!) 103   Temp (!) 97 F (36.1 C) (Temporal)   Resp 20   Ht '6\' 2"'  (1.88 m)   Wt (!) 322 lb (146.1 kg)   SpO2 97%   BMI 41.34 kg/m  Wt Readings from Last 3 Encounters:  01/19/21 (!) 322 lb (146.1 kg)  01/04/21 (!) 319 lb (144.7 kg)  12/28/20 (!) 319 lb 9.6 oz (145 kg)     Health Maintenance Due  Topic Date Due  . Hepatitis C Screening  Never done  . FOOT EXAM  Never done  . OPHTHALMOLOGY EXAM  Never done  . COLONOSCOPY (Pts 45-19yr Insurance coverage will need to be confirmed)  Never done  . COVID-19 Vaccine (2 - Pfizer 3-dose series) 04/03/2020    There are no preventive care reminders to display for this patient.  Lab Results  Component Value Date   TSH 5.320 (H) 07/19/2020   Lab Results  Component Value Date   WBC 6.6 10/12/2020   HGB 13.1 10/12/2020   HCT 42.6 10/12/2020   MCV 85.7 10/12/2020   PLT 232 10/12/2020   Lab Results  Component Value Date   NA 140 12/28/2020   K 3.9 12/28/2020   CO2 25 12/28/2020   GLUCOSE 181 (H) 12/28/2020   BUN 19 12/28/2020   CREATININE 1.13 12/28/2020   BILITOT 0.5 10/12/2020   ALKPHOS 64 10/12/2020   AST 26 10/12/2020   ALT 19 10/12/2020   PROT 7.4 10/12/2020   ALBUMIN 3.3 (L) 10/12/2020   CALCIUM  9.5 12/28/2020   ANIONGAP 10 12/28/2020   Lab Results  Component Value Date   CHOL 116 07/19/2020   Lab Results  Component Value Date   HDL 42 07/19/2020   Lab Results  Component Value Date   LDLCALC 51 07/19/2020   Lab Results  Component Value Date   TRIG 128 07/19/2020   Lab Results  Component Value Date   CHOLHDL 2.8 07/19/2020   Lab Results  Component Value Date   HGBA1C 7.7 (A) 01/19/2021   Assessment & Plan:   1. Type 2 diabetes mellitus with hyperglycemia, with long-term current use of insulin ([place] He will continue medication as prescribed, to decrease foods/beverages high in sugars and carbs and follow Heart Healthy or DASH diet. Increase physical activity to at least 30 minutes cardio exercise daily.  - Glucose (CBG) - HgB A1c  2. Hemoglobin A1c between 7%-9% indicating borderline glucose control Hgb A1c decreased at 7.8 today, from 11.7 on 10/05/2020. Monitor.   3. Hyperglycemia  4. Erectile dysfunction, unspecified erectile dysfunction type - sildenafil (VIAGRA) 100 MG tablet; Take 1 tablet (100 mg total) by mouth daily as needed for erectile dysfunction.  Dispense: 30 tablet; Refill: 2  5. Dental caries Needs teeth removal.  - Ambulatory referral to  Dentistry  6. Shortness of breath Occasional. We will initiate inhaler today for prn use. We will also order Humidifier. No signs or symptoms of respiratory distress noted or reported.  - albuterol (VENTOLIN HFA) 108 (90 Base) MCG/ACT inhaler; Inhale 2 puffs into the lungs every 6 (six) hours as needed for wheezing or shortness of breath.  Dispense: 8 g; Refill: 3  7. Anxiety Stable today. He will continue to follow up with Psychiatry as needed.   8. Follow up He will follow up in 06/2021 for Annual Physical and Labwork.   Meds ordered this encounter  Medications  . sildenafil (VIAGRA) 100 MG tablet    Sig: Take 1 tablet (100 mg total) by mouth daily as needed for erectile dysfunction.    Dispense:   30 tablet    Refill:  2  . albuterol (VENTOLIN HFA) 108 (90 Base) MCG/ACT inhaler    Sig: Inhale 2 puffs into the lungs every 6 (six) hours as needed for wheezing or shortness of breath.    Dispense:  8 g    Refill:  3    Orders Placed This Encounter  Procedures  . Ambulatory referral to Dentistry  . Glucose (CBG)  . HgB A1c    Referral Orders     Ambulatory referral to Dentistry   Kathe Becton, MSN, ANE, FNP-BC [facility] [place] [address] [place], [place] [address] [phone] fax  Problem List Items Addressed This Visit      Endocrine   Type 2 diabetes mellitus with hyperglycemia, with long-term current use of insulin ([place]) - Primary   Relevant Orders   Glucose (CBG) (Completed)   HgB A1c (Completed)     Other   Erectile dysfunction   Relevant Medications   sildenafil (VIAGRA) 100 MG tablet    Other Visit Diagnoses    Dental caries       Relevant Orders   Ambulatory referral to Dentistry   Shortness of breath       Relevant Medications   albuterol (VENTOLIN HFA) 108 (90 Base) MCG/ACT inhaler   Anxiety       Follow up          Meds ordered this encounter  Medications  . sildenafil (VIAGRA) 100 MG tablet    Sig: Take 1 tablet (100 mg total) by mouth daily as needed for erectile dysfunction.    Dispense:  30 tablet    Refill:  2  . albuterol (VENTOLIN HFA) 108 (90 Base) MCG/ACT inhaler    Sig: Inhale 2 puffs into the lungs every 6 (six) hours as needed for wheezing or shortness of breath.    Dispense:  8 g    Refill:  3    Follow-up: No follow-ups on file.    Azzie Glatter, FNP

## 2021-01-19 NOTE — Telephone Encounter (Signed)
Advanced Heart Failure Patient Advocate Encounter   Patient was approved to receive Entresto from [employer]   Patient ID: [id number]  Effective dates: 01/18/2021 through 11/26/21    [name], PharmD, BCPS, BCCP, CPP Heart Failure Clinic Pharmacist [phone]

## 2021-01-19 NOTE — Patient Instructions (Signed)
Shortness of Breath, Adult Shortness of breath means you have trouble breathing. Shortness of breath could be a sign of a medical problem. Follow these instructions at home:  Watch for any changes in your symptoms.  Do not use any products that contain nicotine or tobacco, such as cigarettes, e-cigarettes, and chewing tobacco.  Do not smoke. Smoking can cause shortness of breath. If you need help to quit smoking, ask your doctor.  Avoid things that can make it harder to breathe, such as: ? Mold. ? Dust. ? Air pollution. ? Chemical smells. ? Things that can cause allergy symptoms (allergens), if you have allergies.  Keep your living space clean. Use products that help remove mold and dust.  Rest as needed. Slowly return to your normal activities.  Take over-the-counter and prescription medicines only as told by your doctor. This includes oxygen therapy and inhaled medicines.  Keep all follow-up visits as told by your doctor. This is important.   Contact a doctor if:  Your condition does not get better as soon as expected.  You have a hard time doing your normal activities, even after you rest.  You have new symptoms. Get help right away if:  Your shortness of breath gets worse.  You have trouble breathing when you are resting.  You feel light-headed or you pass out (faint).  You have a cough that is not helped by medicines.  You cough up blood.  You have pain with breathing.  You have pain in your chest, arms, shoulders, or belly (abdomen).  You have a fever.  You cannot walk up stairs.  You cannot exercise the way you normally do. These symptoms may represent a serious problem that is an emergency. Do not wait to see if the symptoms will go away. Get medical help right away. Call your local emergency services (911 in the [place] Do not drive yourself to the hospital. Summary  Shortness of breath is when you have trouble breathing enough air. It can be a sign of a  medical problem.  Avoid things that make it hard for you to breathe, such as smoking, pollution, mold, and dust.  Watch for any changes in your symptoms. Contact your doctor if you do not get better or you get worse. This information is not intended to replace advice given to you by your health care provider. Make sure you discuss any questions you have with your health care provider. Document Revised: 04/15/2018 Document Reviewed: 04/15/2018 Elsevier Patient Education  2021 [place]. Albuterol Metered Dose Inhaler (MDI) What is this medicine? ALBUTEROL (al Normajean [place]) is a bronchodilator. It treats bronchospasm. Bronchospasm is when you have trouble breathing and make loud or whistling sounds when you breathe. This drug opens the airways in the lungs so it is easier to breathe. This medicine may be used for other purposes; ask your health care provider or pharmacist if you have questions. COMMON BRAND NAME(S): Proair HFA, Proventil, Proventil HFA, Respirol, Ventolin, Ventolin HFA What should I tell my health care provider before I take this medicine? They need to know if you have any of the following conditions:  diabetes (high blood sugar)  heart disease  high blood pressure  irregular heartbeat or rhythm  pheochromocytoma  seizures  thyroid disease  an unusual or allergic reaction to albuterol, levalbuterol, other medicines, foods, dyes, or preservatives  pregnant or trying to get pregnant  breast-feeding How should I use this medicine? This medicine is inhaled through the mouth. Take it  as directed on the prescription label. Do not use it more often than directed. This medicine comes with INSTRUCTIONS FOR USE. Ask your pharmacist for directions on how to use this medicine. Read the information carefully. Talk to your pharmacist or health care provider if you have questions. Talk to your health care provider about the use of this medicine in children. While it may be  given to children for selected conditions, precautions do apply. Overdosage: If you think you have taken too much of this medicine contact a poison control center or emergency room at once. NOTE: This medicine is only for you. Do not share this medicine with others. What if I miss a dose? If you take this medication on a regular basis, take it as soon as you can. If it is almost time for your next dose, take only that dose. Do not take double or extra doses. What may interact with this medicine?  anti-infectives like chloroquine and pentamidine  caffeine  cisapride  diuretics  medicines for colds  medicines for depression or for emotional or psychotic conditions  medicines for weight loss including some herbal products  methadone  some antibiotics like clarithromycin, erythromycin, levofloxacin, and linezolid  some heart medicines  steroid hormones like dexamethasone, cortisone, hydrocortisone  theophylline  thyroid hormones This list may not describe all possible interactions. Give your health care provider a list of all the medicines, herbs, non-prescription drugs, or dietary supplements you use. Also tell them if you smoke, drink alcohol, or use illegal drugs. Some items may interact with your medicine. What should I watch for while using this medicine? Visit your health care provider for regular checks on your progress. Tell your health care provider if your symptoms do not start to get better or if they get worse. If your symptoms get worse or if you are using this drug more than normal, call your health care provider right away. Do not treat yourself for coughs, colds or allergies without asking your health care provider for advice. Some nonprescription medicines can affect this one. You and your health care provider should develop an Asthma Action Plan that is just for you. Be sure to know what to do if you are in the yellow (asthma is getting worse) or red (medical alert)  zones. Your mouth may get dry. Chewing sugarless gum or sucking hard candy and drinking plenty of water may help. Contact your health care provider if the problem does not go away or is severe. What side effects may I notice from receiving this medicine? Side effects that you should report to your doctor or health care professional as soon as possible:  allergic reactions (skin rash, itching or hives; swelling of the face, lips, or tongue)  fever  heartbeat rhythm changes (trouble breathing; chest pain; dizziness; fast, irregular heartbeat; feeling faint or lightheaded, falls)  increase in blood pressure  muscle cramps, pain  muscle weakness  pain, tingling, numbness in the hands or feet  vomiting Side effects that usually do not require medical attention (report to your doctor or health care professional if they continue or are bothersome):  change in taste  cough  dry mouth  headache  nasal congestion (runny or stuffy nose)  sore throat  tremors  trouble sleeping  upset stomach This list may not describe all possible side effects. Call your doctor for medical advice about side effects. You may report side effects to FDA at 1-800-FDA-1088. Where should I keep my medicine? Keep out of the  reach of children and pets. Store at room temperature between 20 and 25 degrees C (68 and 77 degrees F). Keep inhaler away from extreme heat and cold. Get rid of it when the dose counter reads 0 or after the expiration date, whichever is first. NOTE: This sheet is a summary. It may not cover all possible information. If you have questions about this medicine, talk to your doctor, pharmacist, or health care provider.  2021 Elsevier/Gold Standard (2020-10-29 10:51:44)

## 2021-01-20 ENCOUNTER — Telehealth: Payer: Self-pay

## 2021-01-20 NOTE — Telephone Encounter (Signed)
Freestyle and cerqaill and viagra refill needed

## 2021-01-21 NOTE — Telephone Encounter (Signed)
Called and spoke with patient  regarding his medications refill , informed patient that [facility]

## 2021-01-24 ENCOUNTER — Telehealth: Payer: Self-pay

## 2021-01-24 DIAGNOSIS — I5022 Chronic systolic (congestive) heart failure: Secondary | ICD-10-CM

## 2021-01-24 DIAGNOSIS — I480 Paroxysmal atrial fibrillation: Secondary | ICD-10-CM

## 2021-01-24 MED FILL — QUETIAPINE FUMARATE 300 MG: 300 | 30 days supply | Qty: 30 | Fill #0

## 2021-01-24 NOTE — Telephone Encounter (Signed)
Biotron[employer] messages received for 2 HVR events logged 01/23/21 @ 2:44 pm & 7:30 PM. No therapies delivered. No duration available.   Medications included on EMR: Eliquis 5 mg BID, Coreg 25 mg BID, Lasix 40 mg BID, Entresto 97-103 mg BID.   Called patient to assess / check medication compliance. No answer, Unable to leave VM d/t not mailbox set up.

## 2021-01-26 ENCOUNTER — Other Ambulatory Visit: Payer: Self-pay

## 2021-01-26 ENCOUNTER — Other Ambulatory Visit (HOSPITAL_COMMUNITY): Payer: Self-pay

## 2021-01-26 ENCOUNTER — Emergency Department (HOSPITAL_COMMUNITY)
Admission: EM | Admit: 2021-01-26 | Discharge: 2021-01-26 | Disposition: A | Discharge status: Home / Self Care | Payer: Medicare Other | Attending: Emergency Medicine | Admitting: Emergency Medicine

## 2021-01-26 ENCOUNTER — Encounter (HOSPITAL_COMMUNITY): Payer: Self-pay

## 2021-01-26 ENCOUNTER — Emergency Department (HOSPITAL_COMMUNITY): Payer: Medicare Other

## 2021-01-26 DIAGNOSIS — R059 Cough, unspecified: Secondary | ICD-10-CM | POA: Diagnosis not present

## 2021-01-26 DIAGNOSIS — E1122 Type 2 diabetes mellitus with diabetic chronic kidney disease: Secondary | ICD-10-CM | POA: Diagnosis not present

## 2021-01-26 DIAGNOSIS — I509 Heart failure, unspecified: Secondary | ICD-10-CM | POA: Insufficient documentation

## 2021-01-26 DIAGNOSIS — I130 Hypertensive heart and chronic kidney disease with heart failure and stage 1 through stage 4 chronic kidney disease, or unspecified chronic kidney disease: Secondary | ICD-10-CM | POA: Insufficient documentation

## 2021-01-26 DIAGNOSIS — R0602 Shortness of breath: Secondary | ICD-10-CM | POA: Insufficient documentation

## 2021-01-26 DIAGNOSIS — Z7984 Long term (current) use of oral hypoglycemic drugs: Secondary | ICD-10-CM | POA: Insufficient documentation

## 2021-01-26 DIAGNOSIS — Z7901 Long term (current) use of anticoagulants: Secondary | ICD-10-CM | POA: Diagnosis not present

## 2021-01-26 DIAGNOSIS — Z87891 Personal history of nicotine dependence: Secondary | ICD-10-CM | POA: Diagnosis not present

## 2021-01-26 DIAGNOSIS — R5383 Other fatigue: Secondary | ICD-10-CM | POA: Diagnosis not present

## 2021-01-26 DIAGNOSIS — R531 Weakness: Secondary | ICD-10-CM | POA: Diagnosis not present

## 2021-01-26 DIAGNOSIS — I2510 Atherosclerotic heart disease of native coronary artery without angina pectoris: Secondary | ICD-10-CM | POA: Diagnosis not present

## 2021-01-26 DIAGNOSIS — Z794 Long term (current) use of insulin: Secondary | ICD-10-CM | POA: Insufficient documentation

## 2021-01-26 DIAGNOSIS — Z79899 Other long term (current) drug therapy: Secondary | ICD-10-CM | POA: Diagnosis not present

## 2021-01-26 DIAGNOSIS — N183 Chronic kidney disease, stage 3 unspecified: Secondary | ICD-10-CM | POA: Insufficient documentation

## 2021-01-26 DIAGNOSIS — R61 Generalized hyperhidrosis: Secondary | ICD-10-CM | POA: Insufficient documentation

## 2021-01-26 DIAGNOSIS — Z951 Presence of aortocoronary bypass graft: Secondary | ICD-10-CM | POA: Diagnosis not present

## 2021-01-26 LAB — URINALYSIS, ROUTINE W REFLEX MICROSCOPIC
Bacteria, UA: NONE SEEN
Bilirubin Urine: NEGATIVE
Glucose, UA: NEGATIVE mg/dL
Ketones, ur: NEGATIVE mg/dL
Leukocytes,Ua: NEGATIVE
Nitrite: NEGATIVE
Protein, ur: 30 mg/dL — AB
Specific Gravity, Urine: 1.006 (ref 1.005–1.030)
pH: 5 (ref 5.0–8.0)

## 2021-01-26 LAB — COMPREHENSIVE METABOLIC PANEL
ALT: 22 U/L (ref 0–44)
AST: 35 U/L (ref 15–41)
Albumin: 2.7 g/dL — ABNORMAL LOW (ref 3.5–5.0)
Alkaline Phosphatase: 56 U/L (ref 38–126)
Anion gap: 8 (ref 5–15)
BUN: 15 mg/dL (ref 6–20)
CO2: 27 mmol/L (ref 22–32)
Calcium: 8.6 mg/dL — ABNORMAL LOW (ref 8.9–10.3)
Chloride: 105 mmol/L (ref 98–111)
Creatinine, Ser: 1.1 mg/dL (ref 0.61–1.24)
GFR, Estimated: >60 mL/min (ref >60)
Glucose, Bld: 187 mg/dL — ABNORMAL HIGH (ref 70–99)
Potassium: 4.5 mmol/L (ref 3.5–5.1)
Sodium: 140 mmol/L (ref 135–145)
Total Bilirubin: 0.9 mg/dL (ref 0.3–1.2)
Total Protein: 6.2 g/dL — ABNORMAL LOW (ref 6.5–8.1)

## 2021-01-26 LAB — CBC WITH DIFFERENTIAL/PLATELET
Abs Immature Granulocytes: 0.01 10*3/uL (ref 0.00–0.07)
Basophils Absolute: 0 10*3/uL (ref 0.0–0.1)
Basophils Relative: 1 %
Eosinophils Absolute: 0.1 10*3/uL (ref 0.0–0.5)
Eosinophils Relative: 3 %
HCT: 41.5 % (ref 39.0–52.0)
Hemoglobin: 12.2 g/dL — ABNORMAL LOW (ref 13.0–17.0)
Immature Granulocytes: 0 %
Lymphocytes Relative: 26 %
Lymphs Abs: 1.4 10*3/uL (ref 0.7–4.0)
MCH: 26 pg (ref 26.0–34.0)
MCHC: 29.4 g/dL — ABNORMAL LOW (ref 30.0–36.0)
MCV: 88.3 fL (ref 80.0–100.0)
Monocytes Absolute: 0.5 10*3/uL (ref 0.1–1.0)
Monocytes Relative: 10 %
Neutro Abs: 3.4 10*3/uL (ref 1.7–7.7)
Neutrophils Relative %: 60 %
Platelets: 264 10*3/uL (ref 150–400)
RBC: 4.7 MIL/uL (ref 4.22–5.81)
RDW: 13 % (ref 11.5–15.5)
WBC: 5.5 10*3/uL (ref 4.0–10.5)
nRBC: 0 % (ref 0.0–0.2)

## 2021-01-26 LAB — I-STAT VENOUS BLOOD GAS, ED
Acid-Base Excess: 9 mmol/L — ABNORMAL HIGH (ref 0.0–2.0)
Bicarbonate: 33.2 mmol/L — ABNORMAL HIGH (ref 20.0–28.0)
Calcium, Ion: 1.08 mmol/L — ABNORMAL LOW (ref 1.15–1.40)
HCT: 39 % (ref 39.0–52.0)
Hemoglobin: 13.3 g/dL (ref 13.0–17.0)
O2 Saturation: 72 %
Potassium: 4.4 mmol/L (ref 3.5–5.1)
Sodium: 143 mmol/L (ref 135–145)
TCO2: 34 mmol/L — ABNORMAL HIGH (ref 22–32)
pCO2, Ven: 44 mmHg (ref 44.0–60.0)
pH, Ven: 7.485 — ABNORMAL HIGH (ref 7.250–7.430)
pO2, Ven: 35 mmHg (ref 32.0–45.0)

## 2021-01-26 LAB — BRAIN NATRIURETIC PEPTIDE: B Natriuretic Peptide: 249.7 pg/mL — ABNORMAL HIGH (ref 0.0–100.0)

## 2021-01-26 LAB — TROPONIN I (HIGH SENSITIVITY)
Troponin I (High Sensitivity): 23 ng/L — ABNORMAL HIGH (ref <18)
Troponin I (High Sensitivity): 21 ng/L — ABNORMAL HIGH (ref <18)

## 2021-01-26 NOTE — ED Triage Notes (Signed)
Pt BIB [facility] from home d/t community health paramedic reporting that upon her arrival to his home pt was diaphoretic & SOB with a BP of 70/46. EMS reports that pt reports that he took one of his brothers medications called "Bronkaid" and that began to make him feel worse. While in route to ED his BP rebounded to 123/78, 65 pulse, 98% on RA & CBG was 343. Upon arrival to ED pt is A/Ox4, verbal-able to make needs known.

## 2021-01-26 NOTE — Progress Notes (Addendum)
RT attempted to obtain ABG x2. RT was unsuccessful both attempts.

## 2021-01-26 NOTE — Discharge Instructions (Signed)
As we discussed, do not take any more of your brothers medication.  Please follow-up with the heart failure clinic as directed.  Return to emergency department for any worsening difficulty breathing, chest pain, vomiting or any other worsening concerning symptoms.

## 2021-01-26 NOTE — ED Notes (Signed)
Pt ambulated in the room with sp02 96%. Pt maintain sp02 96% on RA.Pt denies any sob

## 2021-01-26 NOTE — ED Notes (Signed)
resp called for gas

## 2021-01-26 NOTE — ED Provider Notes (Signed)
[facility] EMERGENCY DEPARTMENT Provider Note   CSN: [id number] Arrival date & time: 01/26/21  1057     History Chief Complaint  Patient presents with  . Shortness of Breath  . Diaphoretic    [name] is a 60 y.o. male PMH/o CAD, CHF (EF 30%), HTN, sleep apnea, DM who presents for evaluation of SOB, fatigue.  Patient reports that over the last few days, he has been more short of breath and very tired.  He states he has had some generalized weakness.  He states for the last few days, he just felt tired.  He states he ran out of his sleeping medication and has not been sleeping well at night he states that he just feels like he falls asleep very quickly and has no energy.  He also reports shortness of breath and some slight cough.  Cough is nonproductive.  He took extra dose of his Lasix to help with shortness of breath.  He states he had not noticed any leg swelling.  Today, he took a dose of his brothers beta-blocker to help with symptoms.  His home health aide got to the house, he was diaphoretic, short of breath.  They checked his blood pressure and found to be hypotensive with a blood pressure 70/46.  In route, his blood pressure improved to 123/78.  Patient was also found to be hyperglycemic with blood sugar in the 300s.  Patient reports he has continued medications regularly but does admit he has missed he has been vaccinated for COVID x3.  He does not think he has had any fevers and denies any chest pain.  Denies any abdominal pain, nausea/vomiting.  He has not been exposed to Covid that he knows of. He denies any exogenous hormone, recent immobilization, prior history of DVT/PE, recent surgery, leg swelling, or long travel.  The history is provided by the patient.       Past Medical History:  Diagnosis Date  . Anxiety   . CAD (coronary artery disease)   . CHF (congestive heart failure) ([place]) 09/2019  . Depression   . Diabetes mellitus   . Erectile  dysfunction 11/2019  . H/O right heart catheterization 09/2019  . Hypertension   . Schizophrenia ([place])   . Sleep apnea    uses cpap    Patient Active Problem List   Diagnosis Date Noted  . 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 Acute blood loss anemia   . Angiodysplasia of stomach   . Chronic anticoagulation   . CHF exacerbation ([place]) 06/15/2020  . AKI (acute kidney injury) ([place]) 06/15/2020  . CKD (chronic kidney disease), stage III ([place]) 06/15/2020  . Anemia due to chronic blood loss   . Gastric AVM   . Angiodysplasia of small intestine ([place])   . Acute on chronic systolic (congestive) heart failure ([place]) 04/05/2020  . Anemia 04/05/2020  . PAF (paroxysmal atrial fibrillation) ([place]) 04/05/2020  . OSA on CPAP 04/05/2020  . Syncope and collapse 04/05/2020  . Type 2 diabetes mellitus with hyperglycemia, with long-term current use of insulin ([place]) 12/03/2019  . Diabetic polyneuropathy associated with type 2 diabetes mellitus ([place]) 12/03/2019  . Hyperglycemia 12/03/2019  . History of hyperglycemia 12/03/2019  . Erectile dysfunction 12/03/2019  . CHF (congestive heart failure) ([place]) 10/11/2019  . Paranoid schizophrenia, chronic condition ([place]) 01/26/2015  . Severe recurrent major depressive disorder with psychotic features ([place]) 01/26/2015  . GAD (generalized anxiety disorder) 01/26/2015  . OCD (obsessive compulsive disorder)  01/26/2015  . Panic disorder without agoraphobia 01/26/2015  . Insomnia 01/26/2015    Past Surgical History:  Procedure Laterality Date  . CORONARY STENT PLACEMENT    . ENTEROSCOPY N/A 04/08/2020   Procedure: ENTEROSCOPY;  Surgeon: Doran Stabler, MD;  Location: [facility] ENDOSCOPY;  Service: Gastroenterology;  Laterality: N/A;  . ENTEROSCOPY N/A 06/17/2020   Procedure: ENTEROSCOPY;  Surgeon: Irene Shipper, MD;  Location: [facility] ENDOSCOPY;  Service: Endoscopy;  Laterality: N/A;  . HEMOSTASIS CLIP PLACEMENT  04/08/2020   Procedure:  HEMOSTASIS CLIP PLACEMENT;  Surgeon: Doran Stabler, MD;  Location: [facility];  Service: Gastroenterology;;  . HEMOSTASIS CONTROL  04/08/2020   Procedure: HEMOSTASIS CONTROL;  Surgeon: Doran Stabler, MD;  Location: [facility] ENDOSCOPY;  Service: Gastroenterology;;  . Laretta Alstrom CONTROL  06/17/2020   Procedure: HEMOSTASIS CONTROL;  Surgeon: Irene Shipper, MD;  Location: [facility] ENDOSCOPY;  Service: Endoscopy;;  . HERNIA REPAIR    . ICD IMPLANT N/A 09/03/2020   Procedure: ICD IMPLANT;  Surgeon: [name], MD;  Location: [facility] CV LAB;  Service: Cardiovascular;  Laterality: N/A;  . RIGHT/LEFT HEART CATH AND CORONARY ANGIOGRAPHY N/A 10/14/2019   Procedure: RIGHT/LEFT HEART CATH AND CORONARY ANGIOGRAPHY;  Surgeon: Belva Crome, MD;  Location: [facility] CV LAB;  Service: Cardiovascular;  Laterality: N/A;       Family History  Problem Relation Age of Onset  . Heart failure Mother   . Mental illness Sister   . Mental illness Sister     Social History   Tobacco Use  . 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 Smokeless tobacco: Never Used  Vaping Use  . Vaping Use: Never used  Substance Use Topics  . Alcohol use: Yes    Alcohol/week: 2.0 standard drinks    Types: 2 Cans of beer per week  . Drug use: No    Home Medications Prior to Admission medications   Medication Sig Start Date End Date Taking? Authorizing Provider  acetaminophen (TYLENOL) 500 MG tablet Take 2 tablets (1,000 mg total) by mouth in the morning and at bedtime. 09/13/20   Azzie Glatter, FNP  albuterol (VENTOLIN HFA) 108 (90 Base) MCG/ACT inhaler Inhale 2 puffs into the lungs every 6 (six) hours as needed for wheezing or shortness of breath. 01/19/21   Azzie Glatter, FNP  apixaban (ELIQUIS) 5 MG TABS tablet Take 1 tablet (5 mg total) by mouth 2 (two) times daily. 04/12/20   Lyda Jester M, PA-C  atorvastatin (LIPITOR) 40 MG tablet Take 1 tablet (40 mg total) by mouth daily at 6 PM. 03/15/20 04/16/21   [name], MD  blood glucose meter kit and supplies KIT Dispense based on patient and insurance preference. Use up to four times daily as directed. (FOR ICD-9 250.00, 250.01). 10/16/19   Barb Merino, MD  buPROPion (WELLBUTRIN SR) 100 MG 12 hr tablet TAKE 1 TABLET (100 MG TOTAL) BY MOUTH 2 (TWO) TIMES DAILY. Patient not taking: Reported on 01/26/2021 12/09/20   Azzie Glatter, FNP  carvedilol (COREG) 25 MG tablet Take 1 tablet (25 mg total) by mouth 2 (two) times daily with a meal. 11/02/20   [name], MD  Continuous Blood Gluc Receiver (FREESTYLE LIBRE 14 DAY READER) [place] 1 each by Does not apply route as needed. 10/26/20   Azzie Glatter, FNP  cyclobenzaprine (FLEXERIL) 10 MG tablet Take 10 mg by mouth 3 (three) times daily as needed for muscle spasms.  [provider]  ferrous sulfate 325 (65 FE) MG tablet Take 325 mg by mouth in the morning and at bedtime.    [provider]  furosemide (LASIX) 40 MG tablet Take 2 tablets (80 mg total) by mouth 2 (two) times daily. 07/20/20   [name], MD  gabapentin (NEURONTIN) 600 MG tablet Take 600 mg by mouth 3 (three) times daily.    [provider]  glipiZIDE (GLUCOTROL) 10 MG tablet Take 1 tablet (10 mg total) by mouth 2 (two) times daily before a meal. 10/05/20   Azzie Glatter, FNP  hydrALAZINE (APRESOLINE) 25 MG tablet Take 1.5 tablets (37.5 mg total) by mouth 3 (three) times daily. 11/03/20 02/01/21  [name], MD  insulin glargine (LANTUS) 100 UNIT/ML injection Inject 0.2 mLs (20 Units total) into the skin daily. 10/26/20   Azzie Glatter, FNP  Insulin Lispro Prot & Lispro (HUMALOG MIX 75/25 KWIKPEN) (75-25) 100 UNIT/ML Kwikpen Inject 30 Units into the skin 2 (two) times daily. 02/18/20   Azzie Glatter, FNP  isosorbide mononitrate (IMDUR) 30 MG 24 hr tablet Take 0.5 tablets (15 mg total) by mouth daily. 11/03/20 02/01/21  [name], MD  metFORMIN (GLUCOPHAGE) 500 MG tablet Take 1  tablet (500 mg total) by mouth 2 (two) times daily with a meal. 10/05/20   Azzie Glatter, FNP  Multiple Vitamins-Minerals (CENTRUM SILVER 50+MEN) TABS Take 1 tablet by mouth daily.    [provider]  pantoprazole (PROTONIX) 40 MG tablet TAKE 1 TABLET (40 MG TOTAL) BY MOUTH 2 (TWO) TIMES DAILY. 10/08/20 10/08/21  [name], MD  QUEtiapine (SEROQUEL) 300 MG tablet Take 300 mg by mouth at bedtime. 12/02/20   [provider]  sacubitril-valsartan (ENTRESTO) 97-103 MG Take 1 tablet by mouth 2 (two) times daily. 06/29/20   [name], MD  sildenafil (VIAGRA) 100 MG tablet Take 1 tablet (100 mg total) by mouth daily as needed for erectile dysfunction. 01/19/21   Azzie Glatter, FNP  spironolactone (ALDACTONE) 25 MG tablet Take 1 tablet (25 mg total) by mouth daily. 03/15/20   [name], MD  tetrahydrozoline 0.05 % ophthalmic solution Place 1 drop into both eyes daily.    [provider]  TRUEPLUS PEN NEEDLES 31G X 6 MM MISC USE AS DIRECTED 11/04/20   Azzie Glatter, FNP    Allergies    Patient has no known allergies.  Review of Systems   Review of Systems  Constitutional: Positive for fatigue. Negative for fever.  Respiratory: Positive for cough and shortness of breath.   Cardiovascular: Negative for chest pain and leg swelling.  Gastrointestinal: Negative for abdominal pain, nausea and vomiting.  Genitourinary: Negative for dysuria and hematuria.  Neurological: Positive for weakness (generalized). Negative for headaches.  All other systems reviewed and are negative.   Physical Exam Updated Vital Signs BP (!) 127/110   Pulse 72   Temp 97.9 F (36.6 C) (Oral)   Resp 17   Ht '6\' 2"'  (1.88 m)   Wt (!) 146.1 kg   SpO2 97%   BMI 41.34 kg/m   Physical Exam Vitals and nursing note reviewed.  Constitutional:      Appearance: Normal appearance. He is well-developed and well-nourished.     Comments: Slightly somnolent but easily arousable   HENT:     Head: Normocephalic and atraumatic.     Mouth/Throat:     Mouth: Oropharynx is clear and moist and mucous membranes are  normal.  Eyes:     General: Lids are normal.     Extraocular Movements: EOM normal.     Conjunctiva/sclera: Conjunctivae normal.     Pupils: Pupils are equal, round, and reactive to light.  Cardiovascular:     Rate and Rhythm: Normal rate and regular rhythm.     Pulses: Normal pulses.     Heart sounds: Normal heart sounds. No murmur heard. No friction rub. No gallop.   Pulmonary:     Effort: Pulmonary effort is normal.     Breath sounds: Normal breath sounds.     Comments: Lungs clear to auscultation bilaterally.  Symmetric chest rise.  No wheezing, rales, rhonchi. Abdominal:     Palpations: Abdomen is soft. Abdomen is not rigid.     Tenderness: There is no abdominal tenderness. There is no guarding.     Comments: Abdomen is soft, non-distended, non-tender. No rigidity, No guarding. No peritoneal signs.  Musculoskeletal:        General: Normal range of motion.     Cervical back: Full passive range of motion without pain.     Comments: BLE are symmetric in appearance without any overlying warmth, erythema, edema.   Skin:    General: Skin is warm and dry.     Capillary Refill: Capillary refill takes less than 2 seconds.  Neurological:     Mental Status: He is alert and oriented to person, place, and time.  Psychiatric:        Mood and Affect: Mood and affect normal.        Speech: Speech normal.     ED Results / Procedures / Treatments   Labs (all labs ordered are listed, but only abnormal results are displayed) Labs Reviewed  CBC WITH DIFFERENTIAL/PLATELET - Abnormal; Notable for the following components:      Result Value   Hemoglobin 12.2 (*)    MCHC 29.4 (*)    All other components within normal limits  BRAIN NATRIURETIC PEPTIDE - Abnormal; Notable for the following components:   B Natriuretic Peptide 249.7 (*)    All other components  within normal limits  COMPREHENSIVE METABOLIC PANEL - Abnormal; Notable for the following components:   Glucose, Bld 187 (*)    Calcium 8.6 (*)    Total Protein 6.2 (*)    Albumin 2.7 (*)    All other components within normal limits  URINALYSIS, ROUTINE W REFLEX MICROSCOPIC - Abnormal; Notable for the following components:   Color, Urine STRAW (*)    Hgb urine dipstick SMALL (*)    Protein, ur 30 (*)    All other components within normal limits  I-STAT VENOUS BLOOD GAS, ED - Abnormal; Notable for the following components:   pH, Ven 7.485 (*)    Bicarbonate 33.2 (*)    TCO2 34 (*)    Acid-Base Excess 9.0 (*)    Calcium, Ion 1.08 (*)    All other components within normal limits  TROPONIN I (HIGH SENSITIVITY) - Abnormal; Notable for the following components:   Troponin I (High Sensitivity) 23 (*)    All other components within normal limits  TROPONIN I (HIGH SENSITIVITY) - Abnormal; Notable for the following components:   Troponin I (High Sensitivity) 21 (*)    All other components within normal limits  BLOOD GAS, VENOUS    EKG EKG Interpretation  Date/Time:  Wednesday January 26 2021 13:55:06 EST Ventricular Rate:  62 PR Interval:    QRS Duration: 110 QT Interval:  530  QTC Calculation: 539 R Axis:   77 Text Interpretation: Sinus rhythm Atrial premature complex Incomplete left bundle branch block Prolonged QT interval Confirmed by Davonna Belling [phone]) on 01/26/2021 2:22:07 PM   Radiology DG Chest 2 View  Result Date: 01/26/2021 CLINICAL DATA:  Shortness of breath EXAM: CHEST - 2 VIEW COMPARISON:  Chest radiograph October 12, 2020 FINDINGS: Chronic cardiomegaly. Left chest AICD with lead in the right ventricle. No focal consolidation. No pleural effusion. No pneumothorax. The visualized skeletal structures are unremarkable. IMPRESSION: No active cardiopulmonary disease. Electronically Signed   By: Dahlia Bailiff MD   On: 01/26/2021 12:39    Procedures Procedures    Medications Ordered in ED Medications - No data to display  ED Course  I have reviewed the triage vital signs and the nursing notes.  Pertinent labs & imaging results that were available during my care of the patient were reviewed by me and considered in my medical decision making (see chart for details).    MDM Rules/Calculators/A&P                          60 year old male who presents for evaluation of shortness of breath.  Cardiac ENT was out to evaluate patient and when they found him, he was diaphoretic, hypotensive.  He had been having some shortness of breath, fatigue over the last 2 days.  Reports taking extra dose of his Lasix as well as his brothers beta-blocker.  He reports only taking 1 dose of the beta-blocker.  He denies any chest pain.  He states he has been tired more recently but he states he ran out of his sleeping pills and has not been sleeping throughout the night.  On initial arrival, he is afebrile, nontoxic-appearing.  Blood pressures in the 120s.  He has not had any more hypotension since being here in the ED.  I suspect this is likely due to taking his brothers beta-blocker.  We will plan to check labs.  Doubt ACS as he is not having any chest pain.  Could possibly consider CHF exacerbation.  He does report taking extra Lasix as well because he thought he was in fluid overload.  History/physical exam not concerning for PE, dissection.  Review of records show that heart failure clinic was aware that patient is coming to the ED.  VBG shows PCO2 of 44.  Troponin is 23.  He has chronically elevated tropes between 18-20.  BNP is 249.7.  CMP shows normal BUN/creatinine.  CBC shows no leukocytosis or anemia.  Chest x-ray negative for any infectious etiology, signs of pulmonary edema.  Discussed with Dr. Sung Amabile (cardiology).  He recommends obtaining delta troponin and walking patient to see if he ambulates without any shortness of breath, chest pain.  If unremarkable  work-up, patient be discharged home.  We will send a message heart failure clinic for close monitoring.  Still troponin is 21.  Patient was able to ambulate while maintaining O2 sats of 96.  He denies any shortness of breath.   Reevaluation.  Patient is resting comfortably in bed with no signs of distress.  O2 sats have maintained between 93-98% while here in the ED.  Blood pressures have been stable.  He has been between 944-96 systolically.  No other hypotension.  I suspect the hypotension was likely secondary to taking his brothers beta-blocker.  I instructed patient to not take any more of his brothers medication.  Instruct patient follow-up with cardiology as  directed. At this time, patient exhibits no emergent life-threatening condition that require further evaluation in ED. Discussed patient with Dr. Alvino Chapel who agrees with plan. Patient had ample opportunity for questions and discussion. All patient's questions were answered with full understanding. Strict return precautions discussed. Patient expresses understanding and agreement to plan.   Portions of this note were generated with [employer]. Dictation errors may occur despite best attempts at proofreading.   Final Clinical Impression(s) / ED Diagnoses Final diagnoses:  SOB (shortness of breath)    Rx / DC Orders ED Discharge Orders    None       Desma Mcgregor 01/26/21 1850    Dav[place] Belling, MD 01/27/21 2351

## 2021-01-26 NOTE — Progress Notes (Signed)
cParamedicine Encounter    Patient ID: [name], male    DOB: November 12, 1961, 60 y.o.   MRN: [id number]   Patient Care Team: Azzie Glatter, FNP as PCP - General (Family Medicine) [name], MD as PCP - Cardiology (Cardiology) [name], MD as PCP - Advanced Heart Failure (Cardiology) [name], MD as PCP - Electrophysiology (Cardiology) Valente David, RN as [place] Management  Patient Active Problem List   Diagnosis Date Noted  . 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 Acute blood loss anemia   . Angiodysplasia of stomach   . Chronic anticoagulation   . CHF exacerbation ([place]) 06/15/2020  . AKI (acute kidney injury) ([place]) 06/15/2020  . CKD (chronic kidney disease), stage III ([place]) 06/15/2020  . Anemia due to chronic blood loss   . Gastric AVM   . Angiodysplasia of small intestine ([place])   . Acute on chronic systolic (congestive) heart failure ([place]) 04/05/2020  . Anemia 04/05/2020  . PAF (paroxysmal atrial fibrillation) ([place]) 04/05/2020  . OSA on CPAP 04/05/2020  . Syncope and collapse 04/05/2020  . Type 2 diabetes mellitus with hyperglycemia, with long-term current use of insulin ([place]) 12/03/2019  . Diabetic polyneuropathy associated with type 2 diabetes mellitus ([place]) 12/03/2019  . Hyperglycemia 12/03/2019  . History of hyperglycemia 12/03/2019  . Erectile dysfunction 12/03/2019  . CHF (congestive heart failure) ([place]) 10/11/2019  . Paranoid schizophrenia, chronic condition ([place]) 01/26/2015  . Severe recurrent major depressive disorder with psychotic features ([place]) 01/26/2015  . GAD (generalized anxiety disorder) 01/26/2015  . OCD (obsessive compulsive disorder) 01/26/2015  . Panic disorder without agoraphobia 01/26/2015  . Insomnia 01/26/2015    Current Outpatient Medications:  .  acetaminophen (TYLENOL) 500 MG tablet, Take 2 tablets (1,000 mg total) by mouth in the morning and at bedtime.,  Disp: 180 tablet, Rfl: 3 .  albuterol (VENTOLIN HFA) 108 (90 Base) MCG/ACT inhaler, Inhale 2 puffs into the lungs every 6 (six) hours as needed for wheezing or shortness of breath., Disp: 8 g, Rfl: 3 .  apixaban (ELIQUIS) 5 MG TABS tablet, Take 1 tablet (5 mg total) by mouth 2 (two) times daily., Disp: 180 tablet, Rfl: 3 .  atorvastatin (LIPITOR) 40 MG tablet, Take 1 tablet (40 mg total) by mouth daily at 6 PM., Disp: 90 tablet, Rfl: 3 .  blood glucose meter kit and supplies KIT, Dispense based on patient and insurance preference. Use up to four times daily as directed. (FOR ICD-9 250.00, 250.01)., Disp: 1 each, Rfl: 0 .  buPROPion (WELLBUTRIN SR) 100 MG 12 hr tablet, TAKE 1 TABLET (100 MG TOTAL) BY MOUTH 2 (TWO) TIMES DAILY., Disp: 60 tablet, Rfl: 6 .  carvedilol (COREG) 25 MG tablet, Take 1 tablet (25 mg total) by mouth 2 (two) times daily with a meal., Disp: 60 tablet, Rfl: 3 .  Continuous Blood Gluc Receiver (FREESTYLE LIBRE 14 DAY READER) DEVI, 1 each by Does not apply route as needed., Disp: 1 each, Rfl: 11 .  cyclobenzaprine (FLEXERIL) 10 MG tablet, Take 10 mg by mouth 3 (three) times daily as needed for muscle spasms., Disp: , Rfl:  .  ferrous sulfate 325 (65 FE) MG tablet, Take 325 mg by mouth in the morning and at bedtime., Disp: , Rfl:  .  furosemide (LASIX) 40 MG tablet, Take 2 tablets (80 mg total) by mouth 2 (two) times daily., Disp: 360 tablet, Rfl: 3 .  gabapentin (NEURONTIN) 600 MG tablet, Take 600 mg  by mouth 3 (three) times daily., Disp: , Rfl:  .  glipiZIDE (GLUCOTROL) 10 MG tablet, Take 1 tablet (10 mg total) by mouth 2 (two) times daily before a meal., Disp: 60 tablet, Rfl: 6 .  hydrALAZINE (APRESOLINE) 25 MG tablet, Take 1.5 tablets (37.5 mg total) by mouth 3 (three) times daily., Disp: 270 tablet, Rfl: 3 .  insulin glargine (LANTUS) 100 UNIT/ML injection, Inject 0.2 mLs (20 Units total) into the skin daily., Disp: 30 mL, Rfl: 12 .  Insulin Lispro Prot & Lispro (HUMALOG MIX  75/25 KWIKPEN) (75-25) 100 UNIT/ML Kwikpen, Inject 30 Units into the skin 2 (two) times daily., Disp: 15 mL, Rfl: 11 .  isosorbide mononitrate (IMDUR) 30 MG 24 hr tablet, Take 0.5 tablets (15 mg total) by mouth daily., Disp: 45 tablet, Rfl: 3 .  metFORMIN (GLUCOPHAGE) 500 MG tablet, Take 1 tablet (500 mg total) by mouth 2 (two) times daily with a meal., Disp: 60 tablet, Rfl: 6 .  Multiple Vitamins-Minerals (CENTRUM SILVER 50+MEN) TABS, Take 1 tablet by mouth daily., Disp: , Rfl:  .  pantoprazole (PROTONIX) 40 MG tablet, TAKE 1 TABLET (40 MG TOTAL) BY MOUTH 2 (TWO) TIMES DAILY., Disp: 60 tablet, Rfl: 1 .  QUEtiapine (SEROQUEL) 300 MG tablet, Take 300 mg by mouth at bedtime., Disp: , Rfl:  .  sacubitril-valsartan (ENTRESTO) 97-103 MG, Take 1 tablet by mouth 2 (two) times daily., Disp: 60 tablet, Rfl: 5 .  sildenafil (VIAGRA) 100 MG tablet, Take 1 tablet (100 mg total) by mouth daily as needed for erectile dysfunction., Disp: 30 tablet, Rfl: 2 .  spironolactone (ALDACTONE) 25 MG tablet, Take 1 tablet (25 mg total) by mouth daily., Disp: 30 tablet, Rfl: 11 .  tetrahydrozoline 0.05 % ophthalmic solution, Place 1 drop into both eyes daily., Disp: , Rfl:  .  TRUEPLUS PEN NEEDLES 31G X 6 MM MISC, USE AS DIRECTED, Disp: 100 each, Rfl: 0 No Known Allergies   Social History   Socioeconomic History  . Marital status: Legally Separated    Spouse name: Not on file  . Number of children: Not on file  . Years of education: Not on file  . Highest education level: Not on file  Occupational History  . Not on file  Tobacco Use  . 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 Smokeless tobacco: Never Used  Vaping Use  . Vaping Use: Never used  Substance and Sexual Activity  . Alcohol use: Yes    Alcohol/week: 2.0 standard drinks    Types: 2 Cans of beer per week  . Drug use: No  . Sexual activity: Yes  Other Topics Concern  . Not on file  Social History Narrative  . Not on file   Social Determinants of Health    Financial Resource Strain: Not on file  Food Insecurity: No Food Insecurity  . Worried About Charity fundraiser in the Last Year: Never true  . Ran Out of Food in the Last Year: Never true  Transportation Needs: Unmet Transportation Needs  . Lack of Transportation (Medical): Yes  . Lack of Transportation (Non-Medical): Yes  Physical Activity: Not on file  Stress: Not on file  Social Connections: Not on file  Intimate Partner Violence: Not on file    Physical Exam Vitals reviewed.  Constitutional:      Appearance: He is ill-appearing and diaphoretic.  HENT:     Head: Normocephalic.     Mouth/Throat:     Mouth: Mucous membranes are moist.  Pharynx: Oropharynx is clear.  Eyes:     Conjunctiva/sclera: Conjunctivae normal.     Pupils: Pupils are equal, round, and reactive to light.     Comments: Eyes swollen  Cardiovascular:     Rate and Rhythm: Normal rate. Rhythm irregular.     Pulses: Normal pulses.     Heart sounds: Normal heart sounds.  Pulmonary:     Effort: Respiratory distress present.     Breath sounds: Normal breath sounds.  Abdominal:     General: There is distension.  Musculoskeletal:        General: Swelling present. Normal range of motion.     Cervical back: Normal range of motion.     Right lower leg: Edema present.     Left lower leg: Edema present.  Skin:    Capillary Refill: Capillary refill takes less than 2 seconds.     Coloration: Skin is pale.  Neurological:     General: No focal deficit present.     Mental Status: Mental status is at baseline.     Comments: "sluggish"  Psychiatric:        Mood and Affect: Mood normal.        Thought Content: Thought content normal.        Judgment: Judgment normal.     Arrived for home visit for [facility] who was alert and oriented seated in the kitchen. [name] reports he has been compliant with medications and did admit to eating some extra salty foods over the last several days. I reviewed medications and  confirmed same, filling pill box accordingly.  While examining patient during visit [name] became very sluggish and diaphoretic. I assessed vitals and his BP was soft and he was fatigued but easily aroused. I obtained EKG and called HF clinic and Dr. Aundra Dubin ordered patient to go to ER. While talking with [name] awaiting EMS arrival   [name] explained that he took an extra Lasix around 0800 and took one of his brothers Ephedrine Sulfates yesterday and today at 0800 to help with shortness of breath. I explained to [name] this was not advised. I contacted HF clinic to make them aware of same. Patient was successfully handed off to EMS and transported to [facility]. I plan to see [name] in one week.  Refills: Gabapentin Wellbutrin Pantoprazole Viagra Freestyle Sensor    [name] was also given Therapist, art today and will call and schedule with one in network over the next week for consult for teeth extraction.   CBG- 343 (no insulin) BP- 70/48 HR- 62 RR- 16 WT- 322lbs    Future Appointments  Date Time Provider [place]  02/07/2021  9:30 AM Valente David, RN [facility] None  03/04/2021  7:40 AM [facility] DEVICE REMOTES [facility]  03/14/2021  9:00 AM Azzie Glatter, FNP [facility] None  03/29/2021  2:20 PM [name], MD [facility] None  06/03/2021  7:40 AM [facility] DEVICE REMOTES [facility]  06/28/2021  8:20 AM Azzie Glatter, FNP [facility] None  09/02/2021  7:40 AM [facility] DEVICE REMOTES [facility]  12/02/2021  7:40 AM [facility] DEVICE REMOTES [facility]  03/03/2022  7:40 AM [facility] DEVICE REMOTES [facility]     ACTION: Home visit completed Next visit planned for one week

## 2021-01-31 LAB — CUP PACEART INCLINIC DEVICE CHECK
Battery Voltage: 3.11 V
Brady Statistic RV Percent Paced: 0 %
Date Time Interrogation Session: 20220112155900
HighPow Impedance: 76 Ohm
Implantable Lead Implant Date: 20211008
Implantable Lead Location: 753860
Implantable Lead Model: 436910
Implantable Lead Serial Number: 81404997
Implantable Pulse Generator Implant Date: 20211008
Lead Channel Impedance Value: 481 Ohm
Lead Channel Pacing Threshold Amplitude: 2 V
Lead Channel Pacing Threshold Pulse Width: 0.4 ms
Lead Channel Sensing Intrinsic Amplitude: 24.2 mV
Lead Channel Setting Pacing Amplitude: 2 V
Lead Channel Setting Pacing Pulse Width: 0.4 ms
Lead Channel Setting Sensing Sensitivity: 0.8 mV
Pulse Gen Model: 429525
Pulse Gen Serial Number: 84810752

## 2021-02-01 ENCOUNTER — Other Ambulatory Visit (HOSPITAL_COMMUNITY): Payer: Self-pay

## 2021-02-01 MED FILL — GABAPENTIN 300 MG CAPSULE: 300 | 30 days supply | Qty: 180 | Fill #1

## 2021-02-01 NOTE — Progress Notes (Unsigned)
Paramedicine Encounter    Patient ID: [name], male    DOB: Dec 24, 1960, 59 y.o.   MRN: [id number]   Patient Care Team: Azzie Glatter, FNP as PCP - General (Family Medicine) [name], MD as PCP - Cardiology (Cardiology) [name], MD as PCP - Advanced Heart Failure (Cardiology) [name], MD as PCP - Electrophysiology (Cardiology) Valente David, RN as [place] Management  Patient Active Problem List   Diagnosis Date Noted  . 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 Acute blood loss anemia   . Angiodysplasia of stomach   . Chronic anticoagulation   . CHF exacerbation ([place]) 06/15/2020  . AKI (acute kidney injury) ([place]) 06/15/2020  . CKD (chronic kidney disease), stage III ([place]) 06/15/2020  . Anemia due to chronic blood loss   . Gastric AVM   . Angiodysplasia of small intestine ([place])   . Acute on chronic systolic (congestive) heart failure ([place]) 04/05/2020  . Anemia 04/05/2020  . PAF (paroxysmal atrial fibrillation) ([place]) 04/05/2020  . OSA on CPAP 04/05/2020  . Syncope and collapse 04/05/2020  . Type 2 diabetes mellitus with hyperglycemia, with long-term current use of insulin ([place]) 12/03/2019  . Diabetic polyneuropathy associated with type 2 diabetes mellitus ([place]) 12/03/2019  . Hyperglycemia 12/03/2019  . History of hyperglycemia 12/03/2019  . Erectile dysfunction 12/03/2019  . CHF (congestive heart failure) ([place]) 10/11/2019  . Paranoid schizophrenia, chronic condition ([place]) 01/26/2015  . Severe recurrent major depressive disorder with psychotic features ([place]) 01/26/2015  . GAD (generalized anxiety disorder) 01/26/2015  . OCD (obsessive compulsive disorder) 01/26/2015  . Panic disorder without agoraphobia 01/26/2015  . Insomnia 01/26/2015    Current Outpatient Medications:  .  acetaminophen (TYLENOL) 500 MG tablet, Take 2 tablets (1,000 mg total) by mouth in the morning and at bedtime.,  Disp: 180 tablet, Rfl: 3 .  albuterol (VENTOLIN HFA) 108 (90 Base) MCG/ACT inhaler, Inhale 2 puffs into the lungs every 6 (six) hours as needed for wheezing or shortness of breath., Disp: 8 g, Rfl: 3 .  apixaban (ELIQUIS) 5 MG TABS tablet, Take 1 tablet (5 mg total) by mouth 2 (two) times daily., Disp: 180 tablet, Rfl: 3 .  atorvastatin (LIPITOR) 40 MG tablet, Take 1 tablet (40 mg total) by mouth daily at 6 PM., Disp: 90 tablet, Rfl: 3 .  blood glucose meter kit and supplies KIT, Dispense based on patient and insurance preference. Use up to four times daily as directed. (FOR ICD-9 250.00, 250.01)., Disp: 1 each, Rfl: 0 .  buPROPion (WELLBUTRIN SR) 100 MG 12 hr tablet, TAKE 1 TABLET (100 MG TOTAL) BY MOUTH 2 (TWO) TIMES DAILY. (Patient not taking: Reported on 01/26/2021), Disp: 60 tablet, Rfl: 6 .  carvedilol (COREG) 25 MG tablet, Take 1 tablet (25 mg total) by mouth 2 (two) times daily with a meal., Disp: 60 tablet, Rfl: 3 .  Continuous Blood Gluc Receiver (FREESTYLE LIBRE 14 DAY READER) DEVI, 1 each by Does not apply route as needed., Disp: 1 each, Rfl: 11 .  cyclobenzaprine (FLEXERIL) 10 MG tablet, Take 10 mg by mouth 3 (three) times daily as needed for muscle spasms., Disp: , Rfl:  .  ferrous sulfate 325 (65 FE) MG tablet, Take 325 mg by mouth in the morning and at bedtime., Disp: , Rfl:  .  furosemide (LASIX) 40 MG tablet, Take 2 tablets (80 mg total) by mouth 2 (two) times daily., Disp: 360 tablet, Rfl: 3 .  gabapentin (NEURONTIN)  600 MG tablet, Take 600 mg by mouth 3 (three) times daily., Disp: , Rfl:  .  glipiZIDE (GLUCOTROL) 10 MG tablet, Take 1 tablet (10 mg total) by mouth 2 (two) times daily before a meal., Disp: 60 tablet, Rfl: 6 .  hydrALAZINE (APRESOLINE) 25 MG tablet, Take 1.5 tablets (37.5 mg total) by mouth 3 (three) times daily., Disp: 270 tablet, Rfl: 3 .  insulin glargine (LANTUS) 100 UNIT/ML injection, Inject 0.2 mLs (20 Units total) into the skin daily., Disp: 30 mL, Rfl: 12 .   Insulin Lispro Prot & Lispro (HUMALOG MIX 75/25 KWIKPEN) (75-25) 100 UNIT/ML Kwikpen, Inject 30 Units into the skin 2 (two) times daily., Disp: 15 mL, Rfl: 11 .  isosorbide mononitrate (IMDUR) 30 MG 24 hr tablet, Take 0.5 tablets (15 mg total) by mouth daily., Disp: 45 tablet, Rfl: 3 .  metFORMIN (GLUCOPHAGE) 500 MG tablet, Take 1 tablet (500 mg total) by mouth 2 (two) times daily with a meal., Disp: 60 tablet, Rfl: 6 .  Multiple Vitamins-Minerals (CENTRUM SILVER 50+MEN) TABS, Take 1 tablet by mouth daily., Disp: , Rfl:  .  pantoprazole (PROTONIX) 40 MG tablet, TAKE 1 TABLET (40 MG TOTAL) BY MOUTH 2 (TWO) TIMES DAILY., Disp: 60 tablet, Rfl: 1 .  QUEtiapine (SEROQUEL) 300 MG tablet, Take 300 mg by mouth at bedtime., Disp: , Rfl:  .  sacubitril-valsartan (ENTRESTO) 97-103 MG, Take 1 tablet by mouth 2 (two) times daily., Disp: 60 tablet, Rfl: 5 .  sildenafil (VIAGRA) 100 MG tablet, Take 1 tablet (100 mg total) by mouth daily as needed for erectile dysfunction., Disp: 30 tablet, Rfl: 2 .  spironolactone (ALDACTONE) 25 MG tablet, Take 1 tablet (25 mg total) by mouth daily., Disp: 30 tablet, Rfl: 11 .  tetrahydrozoline 0.05 % ophthalmic solution, Place 1 drop into both eyes daily., Disp: , Rfl:  .  TRUEPLUS PEN NEEDLES 31G X 6 MM MISC, USE AS DIRECTED, Disp: 100 each, Rfl: 0 No Known Allergies   Social History   Socioeconomic History  . Marital status: Legally Separated    Spouse name: Not on file  . Number of children: Not on file  . Years of education: Not on file  . Highest education level: Not on file  Occupational History  . Not on file  Tobacco Use  . 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 Smokeless tobacco: Never Used  Vaping Use  . Vaping Use: Never used  Substance and Sexual Activity  . Alcohol use: Yes    Alcohol/week: 2.0 standard drinks    Types: 2 Cans of beer per week  . Drug use: No  . Sexual activity: Yes  Other Topics Concern  . Not on file  Social History Narrative  . Not  on file   Social Determinants of Health   Financial Resource Strain: Not on file  Food Insecurity: No Food Insecurity  . Worried About Charity fundraiser in the Last Year: Never true  . Ran Out of Food in the Last Year: Never true  Transportation Needs: Unmet Transportation Needs  . Lack of Transportation (Medical): Yes  . Lack of Transportation (Non-Medical): Yes  Physical Activity: Not on file  Stress: Not on file  Social Connections: Not on file  Intimate Partner Violence: Not on file    Physical Exam Vitals reviewed.  Constitutional:      Appearance: Normal appearance. He is normal weight.  HENT:     Head: Normocephalic.     Nose: Nose normal.  Mouth/Throat:     Mouth: Mucous membranes are moist.     Pharynx: Oropharynx is clear.  Eyes:     Pupils: Pupils are equal, round, and reactive to light.  Cardiovascular:     Rate and Rhythm: Normal rate and regular rhythm.     Pulses: Normal pulses.     Heart sounds: Normal heart sounds.  Pulmonary:     Effort: Pulmonary effort is normal.     Breath sounds: Normal breath sounds.  Abdominal:     General: Abdomen is flat.     Palpations: Abdomen is soft.  Musculoskeletal:        General: No swelling. Normal range of motion.     Cervical back: Normal range of motion.     Right lower leg: No edema.     Left lower leg: No edema.  Skin:    General: Skin is warm and dry.     Capillary Refill: Capillary refill takes less than 2 seconds.  Neurological:     General: No focal deficit present.     Mental Status: He is alert. Mental status is at baseline.  Psychiatric:        Mood and Affect: Mood normal.     Arrived for home visit for [name] who was alert and oriented reporting he is feeling much better this week. [name] was discharged from the ED same day and was sent home with no medication changes. [name] reports he has been taking his medicines as prescribed. Pill box was empty. [name] has lost 4 lbs since our last visit and  no swelling noted. Chris's vitals were obtained and were much improved from our last visit. I reviewed medications and confirmed same, filling two pill boxes.  BOTH BOXES MISSING GABAPENTIN.  [name] knows to pick same up from the pharmacy and where to place it in his pill box. I reached out to pharmacy in regards to getting his freestyle sensor refilled, it will be ready at [facility]. Patient knows to pick it up and how to place it on his skin.   I assisted [name] in calling his dentist and his oral surgeon leaving voicemails for both to return my call to assist him in setting up appointment for his teeth extraction. I will continue to assist.    Home visit complete, I will see [name] in two weeks.   Refills:  THIS WEEK- Gabapentin Spiro  NEXT WEEK- Eliquis Lasix Pantoprazole Seroquel Glipizide Carvedilol Isosorbide Metformin      Future Appointments  Date Time Provider [place]  02/07/2021  9:30 AM Valente David, RN [facility] None  03/04/2021  7:40 AM [facility] DEVICE REMOTES [facility]  03/14/2021  9:00 AM Azzie Glatter, FNP [facility] None  03/29/2021  2:20 PM [name], MD [facility] None  06/03/2021  7:40 AM [facility] DEVICE REMOTES [facility]  06/28/2021  8:20 AM Azzie Glatter, FNP [facility] None  09/02/2021  7:40 AM [facility] DEVICE REMOTES [facility]  12/02/2021  7:40 AM [facility] DEVICE REMOTES [facility]  03/03/2022  7:40 AM [facility] DEVICE REMOTES [facility]     ACTION: Home visit completed Next visit planned for two weeks

## 2021-02-04 NOTE — Telephone Encounter (Signed)
Unable to leave voicemail mail box not set up

## 2021-02-07 ENCOUNTER — Other Ambulatory Visit: Payer: Self-pay | Admitting: *Deleted

## 2021-02-07 NOTE — Patient Outreach (Signed)
[facility] [facility]) Care Management  [place]  02/07/2021   TSUNEO FAISON Aug 26, 1961 [id number]   Outgoing call placed to member, no answer, unable to leave message.  Will follow up within the next 3-4 business days.  Valente David, [place], MSN [place] [phone]

## 2021-02-09 ENCOUNTER — Other Ambulatory Visit: Payer: Self-pay | Admitting: Cardiology

## 2021-02-10 ENCOUNTER — Telehealth (HOSPITAL_COMMUNITY): Payer: Self-pay | Admitting: Licensed Clinical Social Worker

## 2021-02-10 ENCOUNTER — Other Ambulatory Visit: Payer: Self-pay | Admitting: *Deleted

## 2021-02-10 NOTE — Telephone Encounter (Signed)
Community Paramedic requesting Eliquis samples to hold pt over until [facility] delivers pt order of Eliquis.  1 box of samples provided  Will continue to follow and assist as needed  [name], [facility] Desk#: [phone] Cell#: [phone]

## 2021-02-10 NOTE — Patient Outreach (Signed)
[facility] [facility]) Care Management  02/10/2021  [name] Jan 25, 1961 [id number]   Outreach attempt #2, unsuccessful, unable to leave voice message as voice mail has not been set up.  Will send outreach letter and follow up within the next 3-4 business days.  Valente David, [place], MSN [place] [phone]

## 2021-02-15 ENCOUNTER — Other Ambulatory Visit (HOSPITAL_COMMUNITY): Payer: Self-pay

## 2021-02-15 NOTE — Progress Notes (Signed)
Paramedicine Encounter    Patient ID: [name], male    DOB: 16-Jan-1961, 60 y.o.   MRN: [id number]   Patient Care Team: Azzie Glatter, FNP as PCP - General (Family Medicine) [name], MD as PCP - Cardiology (Cardiology) [name], MD as PCP - Advanced Heart Failure (Cardiology) [name], MD as PCP - Electrophysiology (Cardiology) Valente David, RN as [place] Management  Patient Active Problem List   Diagnosis Date Noted  . 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 Acute blood loss anemia   . Angiodysplasia of stomach   . Chronic anticoagulation   . CHF exacerbation ([place]) 06/15/2020  . AKI (acute kidney injury) ([place]) 06/15/2020  . CKD (chronic kidney disease), stage III ([place]) 06/15/2020  . Anemia due to chronic blood loss   . Gastric AVM   . Angiodysplasia of small intestine ([place])   . Acute on chronic systolic (congestive) heart failure ([place]) 04/05/2020  . Anemia 04/05/2020  . PAF (paroxysmal atrial fibrillation) ([place]) 04/05/2020  . OSA on CPAP 04/05/2020  . Syncope and collapse 04/05/2020  . Type 2 diabetes mellitus with hyperglycemia, with long-term current use of insulin ([place]) 12/03/2019  . Diabetic polyneuropathy associated with type 2 diabetes mellitus ([place]) 12/03/2019  . Hyperglycemia 12/03/2019  . History of hyperglycemia 12/03/2019  . Erectile dysfunction 12/03/2019  . CHF (congestive heart failure) ([place]) 10/11/2019  . Paranoid schizophrenia, chronic condition ([place]) 01/26/2015  . Severe recurrent major depressive disorder with psychotic features ([place]) 01/26/2015  . GAD (generalized anxiety disorder) 01/26/2015  . OCD (obsessive compulsive disorder) 01/26/2015  . Panic disorder without agoraphobia 01/26/2015  . Insomnia 01/26/2015    Current Outpatient Medications:  .  acetaminophen (TYLENOL) 500 MG tablet, Take 2 tablets (1,000 mg total) by mouth in the morning and at bedtime.,  Disp: 180 tablet, Rfl: 3 .  albuterol (VENTOLIN HFA) 108 (90 Base) MCG/ACT inhaler, Inhale 2 puffs into the lungs every 6 (six) hours as needed for wheezing or shortness of breath., Disp: 8 g, Rfl: 3 .  apixaban (ELIQUIS) 5 MG TABS tablet, Take 1 tablet (5 mg total) by mouth 2 (two) times daily., Disp: 180 tablet, Rfl: 3 .  atorvastatin (LIPITOR) 40 MG tablet, Take 1 tablet (40 mg total) by mouth daily at 6 PM., Disp: 90 tablet, Rfl: 3 .  blood glucose meter kit and supplies KIT, Dispense based on patient and insurance preference. Use up to four times daily as directed. (FOR ICD-9 250.00, 250.01)., Disp: 1 each, Rfl: 0 .  buPROPion (WELLBUTRIN SR) 100 MG 12 hr tablet, TAKE 1 TABLET (100 MG TOTAL) BY MOUTH 2 (TWO) TIMES DAILY. (Patient not taking: No sig reported), Disp: 60 tablet, Rfl: 6 .  carvedilol (COREG) 25 MG tablet, Take 1 tablet (25 mg total) by mouth 2 (two) times daily with a meal., Disp: 60 tablet, Rfl: 3 .  Continuous Blood Gluc Receiver (FREESTYLE LIBRE 14 DAY READER) DEVI, 1 each by Does not apply route as needed., Disp: 1 each, Rfl: 11 .  cyclobenzaprine (FLEXERIL) 10 MG tablet, Take 10 mg by mouth 3 (three) times daily as needed for muscle spasms., Disp: , Rfl:  .  ferrous sulfate 325 (65 FE) MG tablet, Take 325 mg by mouth in the morning and at bedtime., Disp: , Rfl:  .  furosemide (LASIX) 40 MG tablet, Take 2 tablets (80 mg total) by mouth 2 (two) times daily., Disp: 360 tablet, Rfl: 3 .  gabapentin (NEURONTIN)  600 MG tablet, Take 600 mg by mouth 3 (three) times daily., Disp: , Rfl:  .  glipiZIDE (GLUCOTROL) 10 MG tablet, Take 1 tablet (10 mg total) by mouth 2 (two) times daily before a meal., Disp: 60 tablet, Rfl: 6 .  hydrALAZINE (APRESOLINE) 25 MG tablet, Take 1.5 tablets (37.5 mg total) by mouth 3 (three) times daily., Disp: 270 tablet, Rfl: 3 .  insulin glargine (LANTUS) 100 UNIT/ML injection, Inject 0.2 mLs (20 Units total) into the skin daily., Disp: 30 mL, Rfl: 12 .  Insulin  Lispro Prot & Lispro (HUMALOG MIX 75/25 KWIKPEN) (75-25) 100 UNIT/ML Kwikpen, Inject 30 Units into the skin 2 (two) times daily., Disp: 15 mL, Rfl: 11 .  isosorbide mononitrate (IMDUR) 30 MG 24 hr tablet, Take 0.5 tablets (15 mg total) by mouth daily., Disp: 45 tablet, Rfl: 3 .  metFORMIN (GLUCOPHAGE) 500 MG tablet, Take 1 tablet (500 mg total) by mouth 2 (two) times daily with a meal., Disp: 60 tablet, Rfl: 6 .  Multiple Vitamins-Minerals (CENTRUM SILVER 50+MEN) TABS, Take 1 tablet by mouth daily., Disp: , Rfl:  .  pantoprazole (PROTONIX) 40 MG tablet, TAKE 1 TABLET (40 MG TOTAL) BY MOUTH 2 (TWO) TIMES DAILY., Disp: 60 tablet, Rfl: 1 .  QUEtiapine (SEROQUEL) 300 MG tablet, Take 300 mg by mouth at bedtime., Disp: , Rfl:  .  sacubitril-valsartan (ENTRESTO) 97-103 MG, Take 1 tablet by mouth 2 (two) times daily., Disp: 60 tablet, Rfl: 5 .  sildenafil (VIAGRA) 100 MG tablet, Take 1 tablet (100 mg total) by mouth daily as needed for erectile dysfunction., Disp: 30 tablet, Rfl: 2 .  spironolactone (ALDACTONE) 25 MG tablet, Take 1 tablet (25 mg total) by mouth daily., Disp: 30 tablet, Rfl: 11 .  tetrahydrozoline 0.05 % ophthalmic solution, Place 1 drop into both eyes daily., Disp: , Rfl:  .  TRUEPLUS PEN NEEDLES 31G X 6 MM MISC, USE AS DIRECTED, Disp: 100 each, Rfl: 0 No Known Allergies   Social History   Socioeconomic History  . Marital status: Legally Separated    Spouse name: Not on file  . Number of children: Not on file  . Years of education: Not on file  . Highest education level: Not on file  Occupational History  . Not on file  Tobacco Use  . 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 Smokeless tobacco: Never Used  Vaping Use  . Vaping Use: Never used  Substance and Sexual Activity  . Alcohol use: Yes    Alcohol/week: 2.0 standard drinks    Types: 2 Cans of beer per week  . Drug use: No  . Sexual activity: Yes  Other Topics Concern  . Not on file  Social History Narrative  . Not on file    Social Determinants of Health   Financial Resource Strain: Not on file  Food Insecurity: No Food Insecurity  . Worried About Charity fundraiser in the Last Year: Never true  . Ran Out of Food in the Last Year: Never true  Transportation Needs: Unmet Transportation Needs  . Lack of Transportation (Medical): Yes  . Lack of Transportation (Non-Medical): Yes  Physical Activity: Not on file  Stress: Not on file  Social Connections: Not on file  Intimate Partner Violence: Not on file    Physical Exam Vitals reviewed.  Constitutional:      Appearance: Normal appearance. He is normal weight.  HENT:     Head: Normocephalic.     Nose: Nose normal.  Mouth/Throat:     Mouth: Mucous membranes are moist.     Pharynx: Oropharynx is clear.  Eyes:     Conjunctiva/sclera: Conjunctivae normal.     Pupils: Pupils are equal, round, and reactive to light.  Cardiovascular:     Rate and Rhythm: Normal rate and regular rhythm.  Pulmonary:     Effort: Pulmonary effort is normal.     Breath sounds: Normal breath sounds.  Abdominal:     Palpations: Abdomen is soft.  Musculoskeletal:        General: Normal range of motion.     Cervical back: Normal range of motion.     Right lower leg: No edema.     Left lower leg: No edema.  Skin:    General: Skin is warm and dry.     Capillary Refill: Capillary refill takes less than 2 seconds.  Neurological:     General: No focal deficit present.     Mental Status: He is alert. Mental status is at baseline.  Psychiatric:        Mood and Affect: Mood normal.        Behavior: Behavior normal.        Thought Content: Thought content normal.        Judgment: Judgment normal.    Arrived for home visit for [facility] who was alert and oriented reporting to be feeling good. [name] denied shortness of breath, chest pain, dizziness or trouble taking his medicines over the last two weeks. I reviewed medications and filled two pill boxes accordingly. I assisted  [name] in scheduling an oral surgery appointment which he has on April 5th at 2:30 at the [name]. [name] reports he needs his freestyle libre sensor for the next two weeks, I called [employer] and it is ready for pick up. [name] reports he will pick it up and have assistance placing it. [name] had no swelling in his lower legs. Lungs were clear. Vitals and assessment as noted. [name] agreed to visit in two weeks. Home visit complete.   Refills: Seroquel (called in by psych doctor)  Pantoprazole (called in 3/22 @ 1730)  CBG- 122  WEIGHT THIS WEEK- 317LBS WEIGHT LAST WEEK- 319LBS      Future Appointments  Date Time Provider [place]  02/17/2021 11:30 AM Valente David, RN [facility] None  03/04/2021  7:40 AM [facility] DEVICE REMOTES [facility]  03/14/2021  9:00 AM Azzie Glatter, FNP [facility] None  03/29/2021  2:20 PM [name], MD [facility] None  06/03/2021  7:40 AM [facility] DEVICE REMOTES [facility]  06/28/2021  8:20 AM Azzie Glatter, FNP [facility] None  09/02/2021  7:40 AM [facility] DEVICE REMOTES [facility]  12/02/2021  7:40 AM [facility] DEVICE REMOTES [facility]  03/03/2022  7:40 AM [facility] DEVICE REMOTES [facility]     ACTION: Home visit completed Next visit planned for TWO WEEKS

## 2021-02-16 ENCOUNTER — Other Ambulatory Visit: Payer: Self-pay | Admitting: Cardiology

## 2021-02-16 ENCOUNTER — Other Ambulatory Visit: Payer: Self-pay

## 2021-02-16 MED FILL — PANTOPRAZOLE SOD DR 40 MG T: 40 | 30 days supply | Qty: 60 | Fill #0

## 2021-02-16 MED FILL — QUETIAPINE FUMARATE 300 MG: 300 | 90 days supply | Qty: 90 | Fill #0

## 2021-02-17 ENCOUNTER — Other Ambulatory Visit: Payer: Self-pay | Admitting: *Deleted

## 2021-02-17 NOTE — Patient Outreach (Signed)
[facility] ([facility]) Care Management  [facility] Care Manager  02/17/2021   [name] 03/14/1961 [id number]   Outreach attempt #3, successful.  Member report he was having issues with his phone, has a new one now.  Was seen in the ED on 3/2 with shortness of breath and hypotension, evaluated and discharged.  Denies any issues since then.  Admits that he had taken some medication that his brother gave him, realizes now that he should not have and will not do this again in the future.  He has increased his exercise routine and continued to change his diet.  His weight is down from 322 to 317, blood sugar today was 126.  Denies any urgent concerns, encouraged to contact this care manager with questions.    Encounter Medications:  Outpatient Encounter Medications as of 02/17/2021  Medication Sig Note  . acetaminophen (TYLENOL) 500 MG tablet Take 2 tablets (1,000 mg total) by mouth in the morning and at bedtime.   . albuterol (VENTOLIN HFA) 108 (90 Base) MCG/ACT inhaler Inhale 2 puffs into the lungs every 6 (six) hours as needed for wheezing or shortness of breath.   . apixaban (ELIQUIS) 5 MG TABS tablet Take 1 tablet (5 mg total) by mouth 2 (two) times daily.   . atorvastatin (LIPITOR) 40 MG tablet Take 1 tablet (40 mg total) by mouth daily at 6 PM.   . blood glucose meter kit and supplies KIT Dispense based on patient and insurance preference. Use up to four times daily as directed. (FOR ICD-9 250.00, 250.01).   . buPROPion (WELLBUTRIN SR) 100 MG 12 hr tablet TAKE 1 TABLET (100 MG TOTAL) BY MOUTH 2 (TWO) TIMES DAILY. (Patient not taking: No sig reported) 01/05/2021: OUT OF SAME AS OF  01/05/21  . carvedilol (COREG) 25 MG tablet Take 1 tablet (25 mg total) by mouth 2 (two) times daily with a meal.   . Continuous Blood Gluc Receiver (FREESTYLE LIBRE 14 DAY READER) DEVI 1 each by Does not apply route as needed.   . cyclobenzaprine (FLEXERIL) 10 MG tablet Take 10 mg by mouth 3 (three) times  daily as needed for muscle spasms.   . ferrous sulfate 325 (65 FE) MG tablet Take 325 mg by mouth in the morning and at bedtime.   . furosemide (LASIX) 40 MG tablet Take 2 tablets (80 mg total) by mouth 2 (two) times daily.   . gabapentin (NEURONTIN) 600 MG tablet Take 600 mg by mouth 3 (three) times daily.   . glipiZIDE (GLUCOTROL) 10 MG tablet Take 1 tablet (10 mg total) by mouth 2 (two) times daily before a meal.   . hydrALAZINE (APRESOLINE) 25 MG tablet Take 1.5 tablets (37.5 mg total) by mouth 3 (three) times daily.   . insulin glargine (LANTUS) 100 UNIT/ML injection Inject 0.2 mLs (20 Units total) into the skin daily.   . Insulin Lispro Prot & Lispro (HUMALOG MIX 75/25 KWIKPEN) (75-25) 100 UNIT/ML Kwikpen Inject 30 Units into the skin 2 (two) times daily.   . isosorbide mononitrate (IMDUR) 30 MG 24 hr tablet Take 0.5 tablets (15 mg total) by mouth daily.   . metFORMIN (GLUCOPHAGE) 500 MG tablet Take 1 tablet (500 mg total) by mouth 2 (two) times daily with a meal.   . Multiple Vitamins-Minerals (CENTRUM SILVER 50+MEN) TABS Take 1 tablet by mouth daily.   . pantoprazole (PROTONIX) 40 MG tablet Take 1 tablet (40 mg total) by mouth 2 (two) times daily.   . QUEtiapine (  SEROQUEL) 300 MG tablet Take 300 mg by mouth at bedtime.   . sacubitril-valsartan (ENTRESTO) 97-103 MG Take 1 tablet by mouth 2 (two) times daily.   . sildenafil (VIAGRA) 100 MG tablet Take 1 tablet (100 mg total) by mouth daily as needed for erectile dysfunction.   [name] Kitchen spironolactone (ALDACTONE) 25 MG tablet Take 1 tablet (25 mg total) by mouth daily.   [name] Kitchen tetrahydrozoline 0.05 % ophthalmic solution Place 1 drop into both eyes daily.   . TRUEPLUS PEN NEEDLES 31G X 6 MM MISC USE AS DIRECTED    No facility-administered encounter medications on file as of 02/17/2021.    Functional Status:  In your present state of health, do you have any difficulty performing the following activities: 06/16/2020 06/16/2020  Hearing? - N  Vision? - N   Difficulty concentrating or making decisions? - N  Walking or climbing stairs? - N  Dressing or bathing? - N  Doing errands, shopping? Y -  Some recent data might be hidden    Fall/Depression Screening: Fall Risk  09/13/2020 07/19/2020 04/16/2020  Falls in the past year? 0 1 0  Comment - - not since last hospital visit  Number falls in past yr: 1 0 0  Injury with Fall? 0 0 0  Risk for fall due to : No Fall Risks - -   PHQ 2/9 Scores 01/19/2021 09/13/2020 07/19/2020 07/06/2020 04/16/2020 01/12/2020  PHQ - 2 Score 2 0 0 0 0 0  PHQ- 9 Score 4 - - - - -  Exception Documentation - Medical reason Medical reason - - -    Assessment:  Goals Addressed            This Visit's Progress   . Monitor and Manage My Blood Sugar-Diabetes Type 2   On track    Timeframe:  Long-Range Goal Priority:  High Start Date:            2/14                 Expected End Date:   4/14                    Follow Up Date 03/14/2021    - check blood sugar before and after exercise - check blood sugar if I feel it is too high or too low - take the blood sugar log to all doctor visits    Why is this important?    Checking your blood sugar at home helps to keep it from getting very high or very low.   Writing the results in a diary or log helps the doctor know how to care for you.   Your blood sugar log should have the time, date and the results.   Also, write down the amount of insulin or other medicine that you take.   Other information, like what you ate, exercise done and how you were feeling, will also be helpful.     Notes:   3/24 - Re-educated on proper diabetes management, including increase exercise and decreased carbs/sugars and sodium in diet    . [facility] - Make and Keep All Appointments   On track    Follow Up Date 4/21  Timeframe:  Short-Term Goal Priority:  Medium Start Date:    3/24                     Expected End Date:       4/24              -  call to cancel if needed - keep a  calendar with appointment dates    Why is this important?   Part of staying healthy is seeing the doctor for follow-up care.  If you forget your appointments, there are some things you can do to stay on track.    Notes:   11/24 - Upcoming appointments reviewed, encouraged to keep all dates  12/22 - Reminded of importance of attending all MD appointments  2/14 - Appointments reviewed, advised to secure transportation in effort to decrease risk of no show  3/24 - Reviewed recent visit with EMT, reminded to keep and attend all upcoming visits (4/18 with PCP)        Plan:  Follow-up:  Patient agrees to Care Plan and Follow-up.  Will send education regarding proper exercise regime and heart failure management as well as diabetes management.  Will follow up within the next month.  Monica Lane, RN, MSN [facility] Care Management  Community Care Manager [phone]    

## 2021-02-17 NOTE — Patient Instructions (Signed)
Exercising to Stay Healthy To become healthy and stay healthy, it is recommended that you do moderate-intensity and vigorous-intensity exercise. You can tell that you are exercising at a moderate intensity if your heart starts beating faster and you start breathing faster but can still hold a conversation. You can tell that you are exercising at a vigorous intensity if you are breathing much harder and faster and cannot hold a conversation while exercising. Exercising regularly is important. It has many health benefits, such as:  Improving overall fitness, flexibility, and endurance.  Increasing bone density.  Helping with weight control.  Decreasing body fat.  Increasing muscle strength.  Reducing stress and tension.  Improving overall health. How often should I exercise? Choose an activity that you enjoy, and set realistic goals. Your health care provider can help you make an activity plan that works for you. Exercise regularly as told by your health care provider. This may include:  Doing strength training two times a week, such as: ? Lifting weights. ? Using resistance bands. ? Push-ups. ? Sit-ups. ? Yoga.  Doing a certain intensity of exercise for a given amount of time. Choose from these options: ? A total of 150 minutes of moderate-intensity exercise every week. ? A total of 75 minutes of vigorous-intensity exercise every week. ? A mix of moderate-intensity and vigorous-intensity exercise every week. Children, pregnant women, people who have not exercised regularly, people who are overweight, and older adults may need to talk with a health care provider about what activities are safe to do. If you have a medical condition, be sure to talk with your health care provider before you start a new exercise program. What are some exercise ideas? Moderate-intensity exercise ideas include:  Walking 1 mile (1.6 km) in about 15  minutes.  Biking.  Hiking.  Golfing.  Dancing.  Water aerobics. Vigorous-intensity exercise ideas include:  Walking 4.5 miles (7.2 km) or more in about 1 hour.  Jogging or running 5 miles (8 km) in about 1 hour.  Biking 10 miles (16.1 km) or more in about 1 hour.  Lap swimming.  Roller-skating or in-line skating.  Cross-country skiing.  Vigorous competitive sports, such as football, basketball, and soccer.  Jumping rope.  Aerobic dancing.   What are some everyday activities that can help me to get exercise?  Yard work, such as: ? Pushing a lawn mower. ? Raking and bagging leaves.  Washing your car.  Pushing a stroller.  Shoveling snow.  Gardening.  Washing windows or floors. How can I be more active in my day-to-day activities?  Use stairs instead of an elevator.  Take a walk during your lunch break.  If you drive, park your car farther away from your work or school.  If you take public transportation, get off one stop early and walk the rest of the way.  Stand up or walk around during all of your indoor phone calls.  Get up, stretch, and walk around every 30 minutes throughout the day.  Enjoy exercise with a friend. Support to continue exercising will help you keep a regular routine of activity. What guidelines can I follow while exercising?  Before you start a new exercise program, talk with your health care provider.  Do not exercise so much that you hurt yourself, feel dizzy, or get very short of breath.  Wear comfortable clothes and wear shoes with good support.  Drink plenty of water while you exercise to prevent dehydration or heat stroke.  Work out until   your breathing and your heartbeat get faster. Where to find more information  [place] Department of Health and Human Services: [employer]  Centers for Disease Control and Prevention (CDC): http://www.wolf.info/ Summary  Exercising regularly is important. It will improve your overall fitness,  flexibility, and endurance.  Regular exercise also will improve your overall health. It can help you control your weight, reduce stress, and improve your bone density.  Do not exercise so much that you hurt yourself, feel dizzy, or get very short of breath.  Before you start a new exercise program, talk with your health care provider. This information is not intended to replace advice given to you by your health care provider. Make sure you discuss any questions you have with your health care provider. Document Revised: 10/26/2017 Document Reviewed: 10/04/2017 Elsevier Patient Education  2021 [place]. Heart Failure, Self-Care Heart failure is a serious condition. The following information explains things you need to do to take care of yourself at home. To help you stay as healthy as possible, you may be asked to change your diet, take certain medicines, and make other changes in your life. Your doctor may also give you more specific instructions. If you have problems or questions, call your doctor. What are the risks? Having heart failure makes it more likely for you to have some problems. These problems can get worse if you do not take good care of yourself. Problems may include:  Damage to the kidneys, liver, or lungs.  Malnutrition.  Abnormal heart rhythms.  Blood clotting problems that could cause a stroke. Supplies needed:  Scale for weighing yourself.  Blood pressure monitor.  Notebook.  Medicines. How to care for yourself when you have heart failure Medicines Take over-the-counter and prescription medicines only as told by your doctor. Take your medicines every day.  Do not stop taking your medicine unless your doctor tells you to do so.  Do not skip any medicines.  Get your prescriptions refilled before you run out of medicine. This is important.  Talk with your doctor if you cannot afford your medicines. Eating and drinking  Eat heart-healthy foods. Talk  with a diet specialist (dietitian) to create an eating plan.  Limit salt (sodium) if told by your doctor. Ask your diet specialist to tell you which seasonings are healthy for your heart.  Cook in healthy ways instead of frying. Healthy ways of cooking include roasting, grilling, broiling, baking, poaching, steaming, and stir-frying.  Choose foods that: ? Have no trans fat. ? Are low in saturated fat and cholesterol.  Choose healthy foods, such as: ? Fresh or frozen fruits and vegetables. ? Fish. ? Low-fat (lean) meats. ? Legumes, such as beans, peas, and lentils. ? Fat-free or low-fat dairy products. ? Whole-grain foods. ? High-fiber foods.  Limit how much fluid you drink, if told by your doctor.   Alcohol use  Do not drink alcohol if: ? Your doctor tells you not to drink. ? Your heart was damaged by alcohol, or you have very bad heart failure. ? You are pregnant, may be pregnant, or are planning to become pregnant.  If you drink alcohol: ? Limit how much you have to:  0-1 drink a day for women.  0-2 drinks a day for men. ? Know how much alcohol is in your drink. In the [place] one drink equals one 12 oz bottle of beer (355 mL), one 5 oz glass of wine (148 mL), or one 1 oz glass of hard liquor (44 mL).  Lifestyle  Do not smoke or use any products that contain nicotine or tobacco. If you need help quitting, ask your doctor. ? Do not use nicotine gum or patches before talking to your doctor.  Do not use illegal drugs.  Lose weight if told by your doctor.  Do physical activity if told by your doctor. Talk to your doctor before you begin an exercise if: ? You are an older adult. ? You have very bad heart failure.  Learn to manage stress. If you need help, ask your doctor.  Get physical rehab (rehabilitation) to help you stay independent and to help with your quality of life.  Participate in a cardiac rehab program. This program helps you improve your health through  exercise, education, and counseling.  Plan time to rest when you get tired.   Check weight and blood pressure  Weigh yourself every day. This will help you to know if fluid is building up in your body. ? Weigh yourself every morning after you pee (urinate) and before you eat breakfast. ? Wear the same amount of clothing each time. ? Write down your daily weight. Give your record to your doctor.  Check and write down your blood pressure as told by your doctor.  Check your pulse as told by your doctor.   Dealing with very hot and very cold weather  If it is very hot: ? Avoid activities that take a lot of energy. ? Use air conditioning or fans, or find a cooler place. ? Avoid caffeine and alcohol. ? Wear clothing that is loose-fitting, lightweight, and light-colored.  If it is very cold: ? Avoid activities that take a lot of energy. ? Layer your clothes. ? Wear mittens or gloves, a hat, and a face covering when you go outside. ? Avoid alcohol. Follow these instructions at home:  Stay up to date with shots (vaccines). Get pneumococcal and flu (influenza) shots.  Keep all follow-up visits. Contact a doctor if:  You gain 2-3 lb (1-1.4 kg) in 24 hours or 5 lb (2.3 kg) in a week.  You have increasing shortness of breath.  You cannot do your normal activities.  You get tired easily.  You cough a lot.  You do not feel like eating or feel like you may vomit (nauseous).  You have swelling in your hands, feet, ankles, or belly (abdomen).  You cannot sleep well because it is hard to breathe.  You feel like your heart is beating fast (palpitations).  You get dizzy when you stand up.  You feel depressed or sad. Get help right away if:  You have trouble breathing.  You or someone else notices a change in your behavior, such as having trouble staying awake.  You have chest pain or discomfort.  You pass out (faint). These symptoms may be an emergency. Get help right away.  Call your local emergency services (911 in the [place]  Do not wait to see if the symptoms will go away.  Do not drive yourself to the hospital. Summary  Heart failure is a serious condition. To care for yourself, you may have to change your diet, take medicines, and make other lifestyle changes.  Take your medicines every day. Do not stop taking them unless your doctor tells you to do so.  Limit salt and eat heart-healthy foods.  Ask your doctor if you can drink alcohol. You may have to stop alcohol use if you have very bad heart failure.  Contact your doctor if  you gain weight quickly or feel that your heart is beating too fast. Get help right away if you pass out or have chest pain or trouble breathing. This information is not intended to replace advice given to you by your health care provider. Make sure you discuss any questions you have with your health care provider. Document Revised: 06/05/2020 Document Reviewed: 06/05/2020 Elsevier Patient Education  [place].

## 2021-02-18 ENCOUNTER — Telehealth (HOSPITAL_COMMUNITY): Payer: Self-pay | Admitting: Licensed Clinical Social Worker

## 2021-02-18 NOTE — Telephone Encounter (Signed)
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  MD portion completed and signed.  Faxed to [facility] for review- fax confirmation received- awaiting determination  Will continue to follow and assist as needed  [name], [facility] Desk#: [phone] Cell#: [phone]

## 2021-02-18 NOTE — Telephone Encounter (Signed)
Patient reports during this time he felt chest pain and palpitations during this time. Reports he was walking around, he laid down and has since felt fine. Reports compliance with medications. Advised patient I will forward to Dr. Quentin Ore and we will call with any changes. Agreeable to plan.

## 2021-02-21 NOTE — Telephone Encounter (Signed)
Patient called to set up apt. For blood work. Blood work set up for Wednesday 02/23/21. Location and date discussed with patient.

## 2021-02-23 ENCOUNTER — Other Ambulatory Visit: Payer: Medicare Other | Admitting: *Deleted

## 2021-02-23 ENCOUNTER — Other Ambulatory Visit: Payer: Self-pay

## 2021-02-23 DIAGNOSIS — Z79899 Other long term (current) drug therapy: Secondary | ICD-10-CM

## 2021-02-23 DIAGNOSIS — I5022 Chronic systolic (congestive) heart failure: Secondary | ICD-10-CM

## 2021-02-23 DIAGNOSIS — I480 Paroxysmal atrial fibrillation: Secondary | ICD-10-CM

## 2021-02-23 DIAGNOSIS — R7989 Other specified abnormal findings of blood chemistry: Secondary | ICD-10-CM

## 2021-02-23 NOTE — Addendum Note (Signed)
Addended by: Willeen Cass A on: 02/23/2021 01:03 PM   Modules accepted: Orders

## 2021-02-24 LAB — BASIC METABOLIC PANEL
BUN/Creatinine Ratio: 20 (ref 10–24)
BUN: 26 mg/dL (ref 8–27)
CO2: 23 mmol/L (ref 20–29)
Calcium: 9.4 mg/dL (ref 8.6–10.2)
Chloride: 98 mmol/L (ref 96–106)
Creatinine, Ser: 1.27 mg/dL (ref 0.76–1.27)
Glucose: 105 mg/dL — ABNORMAL HIGH (ref 65–99)
Potassium: 4.4 mmol/L (ref 3.5–5.2)
Sodium: 138 mmol/L (ref 134–144)
eGFR: 65 mL/min/{1.73_m2} (ref >59)

## 2021-02-24 LAB — MAGNESIUM: Magnesium: 1.9 mg/dL (ref 1.6–2.3)

## 2021-02-24 LAB — PRO B NATRIURETIC PEPTIDE: NT-Pro BNP: 249 pg/mL — ABNORMAL HIGH (ref 0–210)

## 2021-02-25 ENCOUNTER — Telehealth: Payer: Self-pay

## 2021-02-25 NOTE — Telephone Encounter (Signed)
[employer] alert received- Ongoing AF episode.  Stored EGM shows some Atrial undersensing.  Atrial Burden currently at 60.7%.    Pt has known PAF, meds include Eliquis for OAC and Carvedilol 25mg  BID.  Attempted to reach pt to assess symptoms, med compliance and need to schedule pt for in-clinic check/programming of sensitivity settings.  Pt is not due for MD follow-up until Sept. 2022 (no recall on record)  No answer and VM has not been set up on either mobile or home phone.  Will have to try again later.

## 2021-02-28 NOTE — Telephone Encounter (Signed)
Repeat [employer] alert for conitnued AF episodes.    Spoke with pt, he reports he has been excited a lot lately but denies any other cardiac symptoms.  Pt confirmed compliance with meds as ordered.    Encouraged pt to keep appt with Dr. Quentin Ore on 03/08/21.

## 2021-03-02 ENCOUNTER — Other Ambulatory Visit (HOSPITAL_COMMUNITY): Payer: Self-pay | Admitting: Cardiology

## 2021-03-02 ENCOUNTER — Other Ambulatory Visit (HOSPITAL_COMMUNITY): Payer: Self-pay

## 2021-03-02 ENCOUNTER — Other Ambulatory Visit: Payer: Self-pay

## 2021-03-02 ENCOUNTER — Other Ambulatory Visit (HOSPITAL_COMMUNITY): Payer: Self-pay | Admitting: *Deleted

## 2021-03-02 MED ORDER — QUETIAPINE FUMARATE 300 MG PO TABS
ORAL_TABLET | ORAL | 0 refills | Status: DC
  Filled 2021-03-02: qty 90, 90d supply, fill #0

## 2021-03-02 MED ORDER — APIXABAN 5 MG PO TABS: 5.0000 mg | ORAL_TABLET | Freq: Two times a day (BID) | ORAL | 3 refills | Status: DC

## 2021-03-02 MED ORDER — APIXABAN 5 MG PO TABS
5.0000 mg | ORAL_TABLET | Freq: Two times a day (BID) | ORAL | 3 refills | Status: DC
  Filled 2021-03-02: qty 60, 30d supply, fill #0
  Filled 2021-03-29: qty 60, 30d supply, fill #1
  Filled 2021-04-26 – 2021-07-11 (×4): qty 60, 30d supply, fill #2
  Filled 2021-08-02: qty 60, 30d supply, fill #3

## 2021-03-02 MED FILL — Gabapentin Cap 300 MG: ORAL | 30 days supply | Qty: 180 | Fill #0 | Status: AC

## 2021-03-02 NOTE — Progress Notes (Signed)
Paramedicine Encounter    Patient ID: [name], male    DOB: Nov 19, 1961, 60 y.o.   MRN: [id number]   Patient Care Team: Azzie Glatter, FNP as PCP - General (Family Medicine) [name], MD as PCP - Cardiology (Cardiology) [name], MD as PCP - Advanced Heart Failure (Cardiology) [name], MD as PCP - Electrophysiology (Cardiology) Valente David, RN as [place] Management [place], Connye Burkitt, LCSW as Education officer, museum (Licensed Clinical Social Worker)  Patient Active Problem List   Diagnosis Date Noted  . 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 Acute blood loss anemia   . Angiodysplasia of stomach   . Chronic anticoagulation   . CHF exacerbation ([place]) 06/15/2020  . AKI (acute kidney injury) ([place]) 06/15/2020  . CKD (chronic kidney disease), stage III ([place]) 06/15/2020  . Anemia due to chronic blood loss   . Gastric AVM   . Angiodysplasia of small intestine ([place])   . Acute on chronic systolic (congestive) heart failure ([place]) 04/05/2020  . Anemia 04/05/2020  . PAF (paroxysmal atrial fibrillation) ([place]) 04/05/2020  . OSA on CPAP 04/05/2020  . Syncope and collapse 04/05/2020  . Type 2 diabetes mellitus with hyperglycemia, with long-term current use of insulin ([place]) 12/03/2019  . Diabetic polyneuropathy associated with type 2 diabetes mellitus ([place]) 12/03/2019  . Hyperglycemia 12/03/2019  . History of hyperglycemia 12/03/2019  . Erectile dysfunction 12/03/2019  . CHF (congestive heart failure) ([place]) 10/11/2019  . Paranoid schizophrenia, chronic condition ([place]) 01/26/2015  . Severe recurrent major depressive disorder with psychotic features ([place]) 01/26/2015  . GAD (generalized anxiety disorder) 01/26/2015  . OCD (obsessive compulsive disorder) 01/26/2015  . Panic disorder without agoraphobia 01/26/2015  . Insomnia 01/26/2015    Current Outpatient Medications:  .  acetaminophen (TYLENOL) 500 MG tablet, Take  2 tablets (1,000 mg total) by mouth in the morning and at bedtime., Disp: 180 tablet, Rfl: 3 .  albuterol (VENTOLIN HFA) 108 (90 Base) MCG/ACT inhaler, Inhale 2 puffs into the lungs every 6 (six) hours as needed for wheezing or shortness of breath., Disp: 8 g, Rfl: 3 .  albuterol (VENTOLIN HFA) 108 (90 Base) MCG/ACT inhaler, INHALE 2 PUFFS INTO THE LUNGS EVERY 6 (SIX) HOURS AS NEEDED FOR WHEEZING OR SHORTNESS OF BREATH., Disp: 8 g, Rfl: 3 .  apixaban (ELIQUIS) 5 MG TABS tablet, Take 1 tablet (5 mg total) by mouth 2 (two) times daily., Disp: 180 tablet, Rfl: 3 .  atorvastatin (LIPITOR) 40 MG tablet, Take 1 tablet (40 mg total) by mouth daily at 6 PM., Disp: 90 tablet, Rfl: 3 .  atorvastatin (LIPITOR) 40 MG tablet, TAKE 1 TABLET (40 MG TOTAL) BY MOUTH DAILY AT 6 PM., Disp: 90 tablet, Rfl: 3 .  blood glucose meter kit and supplies KIT, Dispense based on patient and insurance preference. Use up to four times daily as directed. (FOR ICD-9 250.00, 250.01)., Disp: 1 each, Rfl: 0 .  buPROPion (WELLBUTRIN SR) 100 MG 12 hr tablet, TAKE 1 TABLET (100 MG TOTAL) BY MOUTH 2 (TWO) TIMES DAILY. (Patient not taking: No sig reported), Disp: 60 tablet, Rfl: 6 .  buPROPion (WELLBUTRIN SR) 100 MG 12 hr tablet, TAKE 1 TABLET (100 MG TOTAL) BY MOUTH 2 (TWO) TIMES DAILY., Disp: 60 tablet, Rfl: 6 .  buPROPion (WELLBUTRIN SR) 100 MG 12 hr tablet, TAKE 1 TABLET (100 MG TOTAL) BY MOUTH 2 (TWO) TIMES DAILY., Disp: 60 tablet, Rfl: 6 .  carvedilol (COREG) 12.5 MG tablet, TAKE  1.5 TABLETS (18.75 MG TOTAL) BY MOUTH 2 (TWO) TIMES DAILY WITH A MEAL., Disp: 90 tablet, Rfl: 6 .  carvedilol (COREG) 25 MG tablet, Take 1 tablet (25 mg total) by mouth 2 (two) times daily with a meal., Disp: 60 tablet, Rfl: 3 .  carvedilol (COREG) 25 MG tablet, TAKE 1 TABLET (25 MG TOTAL) BY MOUTH 2 (TWO) TIMES DAILY WITH A MEAL., Disp: 60 tablet, Rfl: 3 .  Continuous Blood Gluc Receiver (FREESTYLE LIBRE 14 DAY READER) DEVI, 1 each by Does not apply route as  needed., Disp: 1 each, Rfl: 11 .  cyclobenzaprine (FLEXERIL) 10 MG tablet, Take 10 mg by mouth 3 (three) times daily as needed for muscle spasms., Disp: , Rfl:  .  ferrous sulfate 325 (65 FE) MG tablet, Take 325 mg by mouth in the morning and at bedtime., Disp: , Rfl:  .  furosemide (LASIX) 40 MG tablet, Take 2 tablets (80 mg total) by mouth 2 (two) times daily., Disp: 360 tablet, Rfl: 3 .  furosemide (LASIX) 40 MG tablet, TAKE 2 TABLETS (80 MG TOTAL) BY MOUTH 2 (TWO) TIMES DAILY., Disp: 360 tablet, Rfl: 3 .  gabapentin (NEURONTIN) 300 MG capsule, TAKE 2 CAPSULES (TOTAL = 600 MG), BY MOUTH, 3 TIMES A DAY., Disp: 180 capsule, Rfl: 11 .  gabapentin (NEURONTIN) 300 MG capsule, TAKE 2 CAPSULES BY MOUTH 2 TIMES DAILY., Disp: 120 capsule, Rfl: 3 .  gabapentin (NEURONTIN) 600 MG tablet, Take 600 mg by mouth 3 (three) times daily., Disp: , Rfl:  .  glipiZIDE (GLUCOTROL) 10 MG tablet, Take 1 tablet (10 mg total) by mouth 2 (two) times daily before a meal., Disp: 60 tablet, Rfl: 6 .  glipiZIDE (GLUCOTROL) 10 MG tablet, TAKE 1 TABLET (10 MG TOTAL) BY MOUTH 2 (TWO) TIMES DAILY BEFORE A MEAL., Disp: 60 tablet, Rfl: 6 .  hydrALAZINE (APRESOLINE) 25 MG tablet, Take 1.5 tablets (37.5 mg total) by mouth 3 (three) times daily., Disp: 270 tablet, Rfl: 3 .  hydrALAZINE (APRESOLINE) 25 MG tablet, TAKE 1.5 TABLETS (37.5 MG TOTAL) BY MOUTH 3 (THREE) TIMES DAILY., Disp: 270 tablet, Rfl: 3 .  insulin glargine (LANTUS) 100 UNIT/ML injection, Inject 0.2 mLs (20 Units total) into the skin daily., Disp: 30 mL, Rfl: 12 .  insulin glargine (LANTUS) 100 UNIT/ML injection, INJECT 0.2 MLS (20 UNITS TOTAL) INTO THE SKIN DAILY., Disp: 30 mL, Rfl: 3 .  Insulin Lispro Prot & Lispro (HUMALOG MIX 75/25 KWIKPEN) (75-25) 100 UNIT/ML Kwikpen, Inject 30 Units into the skin 2 (two) times daily., Disp: 15 mL, Rfl: 11 .  Insulin Pen Needle 31G X 6 MM MISC, USE AS DIRECTED, Disp: 100 each, Rfl: 0 .  Insulin Syringe-Needle U-100 31G X 5/16" 0.3 ML  MISC, USE 3 TIMES DAILY TO INJECT INSULIN, Disp: 300 each, Rfl: 3 .  isosorbide mononitrate (IMDUR) 30 MG 24 hr tablet, Take 0.5 tablets (15 mg total) by mouth daily., Disp: 45 tablet, Rfl: 3 .  isosorbide mononitrate (IMDUR) 30 MG 24 hr tablet, TAKE 0.5 TABLETS (15 MG TOTAL) BY MOUTH DAILY., Disp: 45 tablet, Rfl: 3 .  metFORMIN (GLUCOPHAGE) 500 MG tablet, Take 1 tablet (500 mg total) by mouth 2 (two) times daily with a meal., Disp: 60 tablet, Rfl: 6 .  metFORMIN (GLUCOPHAGE) 500 MG tablet, TAKE 1 TABLET (500 MG TOTAL) BY MOUTH 2 (TWO) TIMES DAILY WITH A MEAL., Disp: 60 tablet, Rfl: 6 .  Multiple Vitamins-Minerals (CENTRUM SILVER 50+MEN) TABS, Take 1 tablet by mouth daily., Disp: ,  Rfl:  .  pantoprazole (PROTONIX) 40 MG tablet, Take 1 tablet (40 mg total) by mouth 2 (two) times daily., Disp: 60 tablet, Rfl: 1 .  pantoprazole (PROTONIX) 40 MG tablet, TAKE 1 TABLET (40 MG TOTAL) BY MOUTH 2 (TWO) TIMES DAILY., Disp: 60 tablet, Rfl: 1 .  pantoprazole (PROTONIX) 40 MG tablet, TAKE 1 TABLET (40 MG TOTAL) BY MOUTH 2 (TWO) TIMES DAILY., Disp: 60 tablet, Rfl: 1 .  potassium chloride SA (KLOR-CON) 20 MEQ tablet, TAKE 1 TABLET (20 MEQ TOTAL) BY MOUTH 2 (TWO) TIMES DAILY., Disp: 60 tablet, Rfl: 6 .  QUEtiapine (SEROQUEL) 200 MG tablet, TAKE 1 TABLET (200 MG TOTAL) BY MOUTH AT BEDTIME., Disp: 30 tablet, Rfl: 6 .  QUEtiapine (SEROQUEL) 300 MG tablet, Take 300 mg by mouth at bedtime., Disp: , Rfl:  .  QUEtiapine (SEROQUEL) 300 MG tablet, TAKE 1 TABLET BY MOUTH AT BEDTIME., Disp: 90 tablet, Rfl: 0 .  QUEtiapine (SEROQUEL) 300 MG tablet, TAKE 1 TABLET BY MOUTH AT BEDTIME, Disp: 30 tablet, Rfl: 0 .  QUEtiapine (SEROQUEL) 300 MG tablet, TAKE ONE TABLET BY MOUTH AT BEDTIME, Disp: 30 tablet, Rfl: 0 .  sacubitril-valsartan (ENTRESTO) 97-103 MG, Take 1 tablet by mouth 2 (two) times daily., Disp: 60 tablet, Rfl: 5 .  sildenafil (VIAGRA) 100 MG tablet, Take 1 tablet (100 mg total) by mouth daily as needed for erectile  dysfunction., Disp: 30 tablet, Rfl: 2 .  sildenafil (VIAGRA) 100 MG tablet, TAKE 1 TABLET (100 MG TOTAL) BY MOUTH DAILY AS NEEDED FOR ERECTILE DYSFUNCTION., Disp: 30 tablet, Rfl: 2 .  spironolactone (ALDACTONE) 25 MG tablet, Take 1 tablet (25 mg total) by mouth daily., Disp: 30 tablet, Rfl: 11 .  spironolactone (ALDACTONE) 25 MG tablet, TAKE 1 TABLET (25 MG TOTAL) BY MOUTH DAILY., Disp: 30 tablet, Rfl: 11 .  tetrahydrozoline 0.05 % ophthalmic solution, Place 1 drop into both eyes daily., Disp: , Rfl:  .  TRUEPLUS PEN NEEDLES 31G X 6 MM MISC, USE AS DIRECTED, Disp: 100 each, Rfl: 0 No Known Allergies   Social History   Socioeconomic History  . Marital status: Legally Separated    Spouse name: Not on file  . Number of children: Not on file  . Years of education: Not on file  . Highest education level: Not on file  Occupational History  . Not on file  Tobacco Use  . 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 Smokeless tobacco: Never Used  Vaping Use  . Vaping Use: Never used  Substance and Sexual Activity  . Alcohol use: Yes    Alcohol/week: 2.0 standard drinks    Types: 2 Cans of beer per week  . Drug use: No  . Sexual activity: Yes  Other Topics Concern  . Not on file  Social History Narrative  . Not on file   Social Determinants of Health   Financial Resource Strain: Not on file  Food Insecurity: No Food Insecurity  . Worried About Charity fundraiser in the Last Year: Never true  . Ran Out of Food in the Last Year: Never true  Transportation Needs: Unmet Transportation Needs  . Lack of Transportation (Medical): Yes  . Lack of Transportation (Non-Medical): Yes  Physical Activity: Not on file  Stress: Not on file  Social Connections: Not on file  Intimate Partner Violence: Not on file    Physical Exam Vitals reviewed.  Constitutional:      Appearance: Normal appearance. He is normal weight.  HENT:  Head: Normocephalic.     Nose: Nose normal.     Mouth/Throat:      Mouth: Mucous membranes are moist.     Pharynx: Oropharynx is clear.  Eyes:     Conjunctiva/sclera: Conjunctivae normal.     Pupils: Pupils are equal, round, and reactive to light.  Cardiovascular:     Rate and Rhythm: Normal rate and regular rhythm.     Pulses: Normal pulses.     Heart sounds: Normal heart sounds.  Pulmonary:     Effort: Pulmonary effort is normal.     Breath sounds: Normal breath sounds.  Abdominal:     General: Abdomen is flat.     Palpations: Abdomen is soft.  Musculoskeletal:        General: Normal range of motion.     Cervical back: Normal range of motion.     Right lower leg: No edema.     Left lower leg: No edema.  Skin:    General: Skin is warm and dry.     Capillary Refill: Capillary refill takes less than 2 seconds.  Neurological:     General: No focal deficit present.     Mental Status: He is alert. Mental status is at baseline.  Psychiatric:        Mood and Affect: Mood normal.    Arrived for home visit for [facility] who was alert and oriented reporting to be feeling good with no complaints. [name] says he has been compliant with his medicines over the last two weeks. Pill boxes were empty. I obtained vitals and assessment and they are as noted. [name] reviewed medications and I filled one pill box accordingly. [name] is needing Eliquis, Gabapentin, Seroquel and Pantoprazole. These are being filled at present at [facility]. I will return tomorrow or Friday once they are filled and picked up by [facility].   I assisted [name] with his Oral Surgery appointment, as his earlier appointment was unsuccessful due to the office not accepting his insurance after we spoke to them and they advised they did earlier this year. I called [employer] and we obtained an updated list and I will call tomorrow to obtain a new appointment for his teeth extraction.    I will follow up this week to complete filling pill box and appointment scheduling.   Home visit  complete.    Refills: Eliquis Gabapentin Seroquel  Pantoprazole        Future Appointments  Date Time Provider [place]  03/04/2021  7:40 AM [facility] DEVICE REMOTES [facility]  03/08/2021  8:30 AM [name], MD [facility]  03/14/2021  8:20 AM [name], NP [facility] None  03/17/2021 10:30 AM Valente David, RN [facility] None  03/29/2021  2:20 PM [name], MD [facility] None  06/03/2021  7:40 AM [facility] DEVICE REMOTES [facility]  06/28/2021  8:20 AM Azzie Glatter, FNP [facility] None  09/02/2021  7:40 AM [facility] DEVICE REMOTES [facility]  12/02/2021  7:40 AM [facility] DEVICE REMOTES [facility]  03/03/2022  7:40 AM [facility] DEVICE REMOTES [facility]     ACTION: Home visit completed Next visit planned for one week

## 2021-03-03 ENCOUNTER — Other Ambulatory Visit: Payer: Self-pay

## 2021-03-03 ENCOUNTER — Telehealth (HOSPITAL_COMMUNITY): Payer: Self-pay

## 2021-03-03 NOTE — Telephone Encounter (Signed)
Left message for [name] to return my call in reference to Oral Surgery update.   I spoke to [employer] and they advised they do have an oral surgeon in network however they reported that if [employer] already filed one consult exam for the year the patient would have to pay $272 out of pocket for consult exam.    I contacted [employer] and attempted to speak to the billing department and left a message for Janett Billow to inquire if they did in fact file insurance for this consult.   Tentivley appointment scheduled at [facility] for June 10th at 1:00 and [name] is on their short call list as well. I will continue to assist.

## 2021-03-04 ENCOUNTER — Ambulatory Visit (INDEPENDENT_AMBULATORY_CARE_PROVIDER_SITE_OTHER): Payer: Medicare Other

## 2021-03-04 DIAGNOSIS — Z95810 Presence of automatic (implantable) cardiac defibrillator: Secondary | ICD-10-CM

## 2021-03-04 LAB — CUP PACEART REMOTE DEVICE CHECK
Battery Voltage: 3.11 V
Brady Statistic RV Percent Paced: 0 %
Date Time Interrogation Session: 20220407093747
HighPow Impedance: 71 Ohm
Implantable Lead Implant Date: 20211008
Implantable Lead Location: 753860
Implantable Lead Model: 436910
Implantable Lead Serial Number: 81404997
Implantable Pulse Generator Implant Date: 20211008
Lead Channel Impedance Value: 481 Ohm
Lead Channel Pacing Threshold Amplitude: 0.4 V
Lead Channel Pacing Threshold Pulse Width: 0.4 ms
Lead Channel Sensing Intrinsic Amplitude: 1.6 mV
Lead Channel Sensing Intrinsic Amplitude: 20 mV
Lead Channel Setting Pacing Amplitude: 2 V
Lead Channel Setting Pacing Pulse Width: 0.4 ms
Lead Channel Setting Sensing Sensitivity: 0.8 mV
Pulse Gen Model: 429525
Pulse Gen Serial Number: 84810752

## 2021-03-07 ENCOUNTER — Other Ambulatory Visit: Payer: Self-pay | Admitting: Family Medicine

## 2021-03-07 DIAGNOSIS — K219 Gastro-esophageal reflux disease without esophagitis: Secondary | ICD-10-CM

## 2021-03-07 MED ORDER — PANTOPRAZOLE SODIUM 40 MG PO TBEC
DELAYED_RELEASE_TABLET | Freq: Two times a day (BID) | ORAL | 11 refills | Status: DC
  Filled 2021-03-07: qty 60, fill #0

## 2021-03-08 ENCOUNTER — Other Ambulatory Visit (HOSPITAL_COMMUNITY): Payer: Self-pay

## 2021-03-08 ENCOUNTER — Ambulatory Visit (INDEPENDENT_AMBULATORY_CARE_PROVIDER_SITE_OTHER): Payer: Medicare Other | Admitting: Cardiology

## 2021-03-08 ENCOUNTER — Other Ambulatory Visit: Payer: Self-pay

## 2021-03-08 ENCOUNTER — Encounter: Payer: Self-pay | Admitting: Cardiology

## 2021-03-08 VITALS — BP 114/66 | HR 70 | Ht 74.0 in | Wt 323.2 lb

## 2021-03-08 DIAGNOSIS — Z95810 Presence of automatic (implantable) cardiac defibrillator: Secondary | ICD-10-CM

## 2021-03-08 DIAGNOSIS — I480 Paroxysmal atrial fibrillation: Secondary | ICD-10-CM

## 2021-03-08 DIAGNOSIS — I5022 Chronic systolic (congestive) heart failure: Secondary | ICD-10-CM

## 2021-03-08 NOTE — Progress Notes (Signed)
Paramedicine Encounter    Patient ID: [name], male    DOB: 25-Jun-1961, 60 y.o.   MRN: [id number]  Came out for med rec for Alafaya today. Medications reviewed and confirmed. Two pill boxes filled accordingly. I will see [name] in two weeks. Visit complete.   Refills: (NEXT WEEK) Glipizide Lasix Arlyce Harman Carvedilol   ACTION: Home visit completed Next visit planned for two weeks

## 2021-03-08 NOTE — Patient Instructions (Addendum)
Medication Instructions:  Your physician recommends that you continue on your current medications as directed. Please refer to the Current Medication list given to you today. *If you need a refill on your cardiac medications before your next appointment, please call your pharmacy*  Lab Work: None ordered. If you have labs (blood work) drawn today and your tests are completely normal, you will receive your results only by: [name] Kitchen MyChart Message (if you have MyChart) OR . A paper copy in the mail If you have any lab test that is abnormal or we need to change your treatment, we will call you to review the results.  Testing/Procedures: None ordered.  Follow-Up: At [facility], you and your health needs are our priority.  As part of our continuing mission to provide you with exceptional heart care, we have created designated Provider Care Teams.  These Care Teams include your primary Cardiologist (physician) and Advanced Practice Providers (APPs -  Physician Assistants and Nurse Practitioners) who all work together to provide you with the care you need, when you need it.  Your next appointment:   Your physician wants you to follow-up in: 9 months with Dr. Quentin Ore.   You will receive a reminder letter in the mail two months in advance. If you don't receive a letter, please call our office to schedule the follow-up appointment.  Remote monitoring is used to monitor your ICD from home. This monitoring reduces the number of office visits required to check your device to one time per year. It allows [place] to keep an eye on the functioning of your device to ensure it is working properly. You are scheduled for a device check from home on 06/03/2021. You may send your transmission at any time that day. If you have a wireless device, the transmission will be sent automatically. After your physician reviews your transmission, you will receive a postcard with your next transmission date.

## 2021-03-08 NOTE — Progress Notes (Signed)
Electrophysiology Office Follow up Visit Note:    Date:  03/08/2021   ID:  [name], DOB Jun 05, 1961, MRN [id number]  PCP:  Azzie Glatter, FNP  [facility] Cardiologist:  [name], MD  [facility] Electrophysiologist:  [name], MD    Interval History:    [name] is a 60 y.o. male who presents for a follow up visit.  I implanted a Biotronik VDI ICD on September 03, 2020.  Since implant he has done well.  Device interrogations have shown stable lead parameters.  He has paroxysmal atrial fibrillation with the most recent burden of 2.1%.  He is on Eliquis for stroke prophylaxis.  He presented to the emergency department on January 26, 2021 with shortness of breath and somnolence.  His evaluation there was not suggestive of ischemia.  He was able to ambulate without hypoxia.  He had taken a dose of his brothers beta-blocker prior to his presentation and this was thought to be contributing to his symptoms.  Patient tells me he has been doing well.  No ICD shocks.  Past Medical History:  Diagnosis Date  . Anxiety   . CAD (coronary artery disease)   . CHF (congestive heart failure) ([place]) 09/2019  . Depression   . Diabetes mellitus   . Erectile dysfunction 11/2019  . H/O right heart catheterization 09/2019  . Hypertension   . Schizophrenia ([place])   . Sleep apnea    uses cpap    Past Surgical History:  Procedure Laterality Date  . CORONARY STENT PLACEMENT    . ENTEROSCOPY N/A 04/08/2020   Procedure: ENTEROSCOPY;  Surgeon: Doran Stabler, MD;  Location: [facility] ENDOSCOPY;  Service: Gastroenterology;  Laterality: N/A;  . ENTEROSCOPY N/A 06/17/2020   Procedure: ENTEROSCOPY;  Surgeon: Irene Shipper, MD;  Location: [facility] ENDOSCOPY;  Service: Endoscopy;  Laterality: N/A;  . HEMOSTASIS CLIP PLACEMENT  04/08/2020   Procedure: HEMOSTASIS CLIP PLACEMENT;  Surgeon: Doran Stabler, MD;  Location: [place];  Service: Gastroenterology;;  . HEMOSTASIS CONTROL   04/08/2020   Procedure: HEMOSTASIS CONTROL;  Surgeon: Doran Stabler, MD;  Location: [facility] ENDOSCOPY;  Service: Gastroenterology;;  . Laretta Alstrom CONTROL  06/17/2020   Procedure: HEMOSTASIS CONTROL;  Surgeon: Irene Shipper, MD;  Location: [facility] ENDOSCOPY;  Service: Endoscopy;;  . HERNIA REPAIR    . ICD IMPLANT N/A 09/03/2020   Procedure: ICD IMPLANT;  Surgeon: [name], MD;  Location: [place] CV LAB;  Service: Cardiovascular;  Laterality: N/A;  . RIGHT/LEFT HEART CATH AND CORONARY ANGIOGRAPHY N/A 10/14/2019   Procedure: RIGHT/LEFT HEART CATH AND CORONARY ANGIOGRAPHY;  Surgeon: Belva Crome, MD;  Location: [place] CV LAB;  Service: Cardiovascular;  Laterality: N/A;    Current Medications: Current Meds  Medication Sig  . acetaminophen (TYLENOL) 500 MG tablet Take 2 tablets (1,000 mg total) by mouth in the morning and at bedtime.  [name] Kitchen albuterol (VENTOLIN HFA) 108 (90 Base) MCG/ACT inhaler INHALE 2 PUFFS INTO THE LUNGS EVERY 6 (SIX) HOURS AS NEEDED FOR WHEEZING OR SHORTNESS OF BREATH.  [name] Kitchen apixaban (ELIQUIS) 5 MG TABS tablet Take 1 tablet (5 mg total) by mouth 2 (two) times daily.  [name] Kitchen atorvastatin (LIPITOR) 40 MG tablet Take 1 tablet (40 mg total) by mouth daily at 6 PM.  . blood glucose meter kit and supplies KIT Dispense based on patient and insurance preference. Use up to four times daily as directed. (FOR ICD-9 250.00, 250.01).  [name] Kitchen buPROPion [facility]  SR) 100 MG 12 hr tablet TAKE 1 TABLET (100 MG TOTAL) BY MOUTH 2 (TWO) TIMES DAILY.  [name] Kitchen buPROPion (WELLBUTRIN SR) 100 MG 12 hr tablet TAKE 1 TABLET (100 MG TOTAL) BY MOUTH 2 (TWO) TIMES DAILY.  . carvedilol (COREG) 25 MG tablet TAKE 1 TABLET (25 MG TOTAL) BY MOUTH 2 (TWO) TIMES DAILY WITH A MEAL.  [name] Kitchen Continuous Blood Gluc Receiver (FREESTYLE LIBRE 14 DAY READER) DEVI 1 each by Does not apply route as needed.  . cyclobenzaprine (FLEXERIL) 10 MG tablet Take 10 mg by mouth 3 (three) times daily as needed for muscle spasms.  . ferrous sulfate  325 (65 FE) MG tablet Take 325 mg by mouth in the morning and at bedtime.  . furosemide (LASIX) 40 MG tablet TAKE 2 TABLETS (80 MG TOTAL) BY MOUTH 2 (TWO) TIMES DAILY.  [name] Kitchen gabapentin (NEURONTIN) 300 MG capsule TAKE 2 CAPSULES (TOTAL = 600 MG), BY MOUTH, 3 TIMES A DAY.  [name] Kitchen glipiZIDE (GLUCOTROL) 10 MG tablet TAKE 1 TABLET (10 MG TOTAL) BY MOUTH 2 (TWO) TIMES DAILY BEFORE A MEAL.  . hydrALAZINE (APRESOLINE) 25 MG tablet TAKE 1.5 TABLETS (37.5 MG TOTAL) BY MOUTH 3 (THREE) TIMES DAILY.  [name] Kitchen insulin glargine (LANTUS) 100 UNIT/ML injection INJECT 0.2 MLS (20 UNITS TOTAL) INTO THE SKIN DAILY.  [name] Kitchen Insulin Lispro Prot & Lispro (HUMALOG MIX 75/25 KWIKPEN) (75-25) 100 UNIT/ML Kwikpen Inject 30 Units into the skin 2 (two) times daily.  . Insulin Pen Needle 31G X 6 MM MISC USE AS DIRECTED  . Insulin Syringe-Needle U-100 31G X 5/16" 0.3 ML MISC USE 3 TIMES DAILY TO INJECT INSULIN  . isosorbide mononitrate (IMDUR) 30 MG 24 hr tablet TAKE 0.5 TABLETS (15 MG TOTAL) BY MOUTH DAILY.  . metFORMIN (GLUCOPHAGE) 500 MG tablet TAKE 1 TABLET (500 MG TOTAL) BY MOUTH 2 (TWO) TIMES DAILY WITH A MEAL.  . Multiple Vitamins-Minerals (CENTRUM SILVER 50+MEN) TABS Take 1 tablet by mouth daily.  . pantoprazole (PROTONIX) 40 MG tablet TAKE 1 TABLET (40 MG TOTAL) BY MOUTH 2 (TWO) TIMES DAILY.  [name] Kitchen QUEtiapine (SEROQUEL) 300 MG tablet TAKE 1 TABLET BY MOUTH AT BEDTIME.  . sacubitril-valsartan (ENTRESTO) 97-103 MG Take 1 tablet by mouth 2 (two) times daily.  . sildenafil (VIAGRA) 100 MG tablet Take 1 tablet (100 mg total) by mouth daily as needed for erectile dysfunction.  . sildenafil (VIAGRA) 100 MG tablet TAKE 1 TABLET (100 MG TOTAL) BY MOUTH DAILY AS NEEDED FOR ERECTILE DYSFUNCTION.  [name] Kitchen spironolactone (ALDACTONE) 25 MG tablet Take 1 tablet (25 mg total) by mouth daily.  [name] Kitchen spironolactone (ALDACTONE) 25 MG tablet TAKE 1 TABLET (25 MG TOTAL) BY MOUTH DAILY.  [name] Kitchen tetrahydrozoline 0.05 % ophthalmic solution Place 1 drop into both eyes daily.  .  TRUEPLUS PEN NEEDLES 31G X 6 MM MISC USE AS DIRECTED  . [DISCONTINUED] albuterol (VENTOLIN HFA) 108 (90 Base) MCG/ACT inhaler Inhale 2 puffs into the lungs every 6 (six) hours as needed for wheezing or shortness of breath.     Allergies:   Patient has no known allergies.   Social History   Socioeconomic History  . Marital status: Legally Separated    Spouse name: Not on file  . Number of children: Not on file  . Years of education: Not on file  . Highest education level: Not on file  Occupational History  . Not on file  Tobacco Use  . 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 Smokeless tobacco: Never Used  Vaping Use  . Vaping  Use: Never used  Substance and Sexual Activity  . Alcohol use: Yes    Alcohol/week: 2.0 standard drinks    Types: 2 Cans of beer per week  . Drug use: No  . Sexual activity: Yes  Other Topics Concern  . Not on file  Social History Narrative  . Not on file   Social Determinants of Health   Financial Resource Strain: Not on file  Food Insecurity: No Food Insecurity  . Worried About Charity fundraiser in the Last Year: Never true  . Ran Out of Food in the Last Year: Never true  Transportation Needs: Unmet Transportation Needs  . Lack of Transportation (Medical): Yes  . Lack of Transportation (Non-Medical): Yes  Physical Activity: Not on file  Stress: Not on file  Social Connections: Not on file     Family History: The patient's family history includes Heart failure in his mother; Mental illness in his sister and sister.  ROS:   Please see the history of present illness.    All other systems reviewed and are negative.  EKGs/Labs/Other Studies Reviewed:    The following studies were reviewed today:  March 08, 2021 device interrogation personally reviewed Lead parameters stable Battery longevity good VVI 40 1.7% atrial fibrillation burden Episode of rapid ventricular rate that has a one-to-one AV relationship and appears to be in atrial  tachycardia with one-to-one conduction  EKG:  The ekg ordered today demonstrates normal sinus rhythm with PAC  Recent Labs: 07/19/2020: TSH 5.320 01/26/2021: ALT 22; B Natriuretic Peptide 249.7; Hemoglobin 13.3; Platelets 264 02/23/2021: BUN 26; Creatinine, Ser 1.27; Magnesium 1.9; NT-Pro BNP 249; Potassium 4.4; Sodium 138  Recent Lipid Panel    Component Value Date/Time   CHOL 116 07/19/2020 0940   TRIG 128 07/19/2020 0940   HDL 42 07/19/2020 0940   CHOLHDL 2.8 07/19/2020 0940   CHOLHDL 3.6 03/29/2015 1402   VLDL 24 03/29/2015 1402   LDLCALC 51 07/19/2020 0940    Physical Exam:    VS:  There were no vitals taken for this visit.    Wt Readings from Last 3 Encounters:  03/02/21 (!) 329 lb (149.2 kg)  02/15/21 (!) 317 lb (143.8 kg)  02/01/21 (!) 319 lb (144.7 kg)     GEN:  Well nourished, well developed in no acute distress.  Morbidly obese HEENT: Normal NECK: No JVD; No carotid bruits LYMPHATICS: No lymphadenopathy CARDIAC: RRR, no murmurs, rubs, gallops.  ICD pocket well-healed without pain RESPIRATORY:  Clear to auscultation without rales, wheezing or rhonchi  ABDOMEN: Soft, non-tender, non-distended MUSCULOSKELETAL:  No edema; No deformity  SKIN: Warm and dry NEUROLOGIC:  Alert and oriented x 3 PSYCHIATRIC:  Normal affect   ASSESSMENT:    1. Chronic systolic congestive heart failure ([place])   2. ICD (implantable cardioverter-defibrillator) in place   3. PAF (paroxysmal atrial fibrillation) (HCC)    PLAN:    In order of problems listed above:  1. Chronic systolic heart failure NYHA class II symptoms today.  Warm and dry. No ICD shocks. Continue Coreg, Lasix, hydralazine, isosorbide mononitrate, Entresto and spironolactone  2.  ICD in situ Device interrogation shows stable lead parameters. No ICD shocks.  3.  Paroxysmal atrial fibrillation low burden, less than 2%. Continue Eliquis for stroke prophylaxis twice daily.  Follow-up 9 months.  Medication  Adjustments/Labs and Tests Ordered: Current medicines are reviewed at length with the patient today.  Concerns regarding medicines are outlined above.  Orders Placed This Encounter  Procedures  .  EKG 12-Lead   No orders of the defined types were placed in this encounter.    Signed, Lars Mage, MD, [facility], [facility] 03/08/2021 8:38 AM    Electrophysiology [facility]

## 2021-03-10 ENCOUNTER — Other Ambulatory Visit: Payer: Self-pay

## 2021-03-10 MED FILL — Carvedilol Tab 25 MG: ORAL | 30 days supply | Qty: 60 | Fill #0 | Status: CN

## 2021-03-10 MED FILL — Glipizide Tab 10 MG: ORAL | 30 days supply | Qty: 60 | Fill #0 | Status: CN

## 2021-03-10 MED FILL — Furosemide Tab 40 MG: ORAL | 30 days supply | Qty: 120 | Fill #0 | Status: CN

## 2021-03-14 ENCOUNTER — Other Ambulatory Visit: Payer: Self-pay

## 2021-03-14 ENCOUNTER — Ambulatory Visit: Payer: Self-pay | Admitting: Nurse Practitioner

## 2021-03-14 ENCOUNTER — Ambulatory Visit: Payer: Self-pay | Admitting: Family Medicine

## 2021-03-15 ENCOUNTER — Telehealth: Payer: Self-pay

## 2021-03-15 NOTE — Telephone Encounter (Signed)
Called and spoke w patient , he get this refill from his cardiologist, informed pt that he will need to call his cardiologist office  Dr Loralie Champagne

## 2021-03-15 NOTE — Telephone Encounter (Signed)
Med refill for Pantoprazole to [facility] wellness

## 2021-03-15 NOTE — Telephone Encounter (Signed)
error 

## 2021-03-17 ENCOUNTER — Other Ambulatory Visit (HOSPITAL_COMMUNITY): Payer: Self-pay | Admitting: *Deleted

## 2021-03-17 ENCOUNTER — Other Ambulatory Visit: Payer: Self-pay | Admitting: *Deleted

## 2021-03-17 ENCOUNTER — Other Ambulatory Visit: Payer: Self-pay

## 2021-03-17 MED ORDER — PANTOPRAZOLE SODIUM 40 MG PO TBEC
DELAYED_RELEASE_TABLET | Freq: Two times a day (BID) | ORAL | 1 refills | Status: DC
  Filled 2021-03-17: qty 60, 30d supply, fill #0

## 2021-03-17 NOTE — Patient Outreach (Signed)
[facility]) Care Management  03/17/2021  MUMIN DENOMME 10/29/1961 [id number]   Outgoing call placed to member.  Report he is doing well, has had a slight increase in weight, ranging 319-325 recently.  State blood pressure has been "good."  Agrees that he still need to monitor his diet more and increase exercise.  Nira Conn, EMT will visit member next week.  Denies any urgent concerns, encouraged to contact this care manager with questions.  Agrees to follow up within the next month.  Goals Addressed            This Visit's Progress   . Monitor and Manage My Blood Sugar-Diabetes Type 2   On track    Timeframe:  Long-Range Goal Priority:  High Start Date:            2/14                 Expected End Date:   6/14               - check blood sugar before and after exercise - check blood sugar if I feel it is too high or too low - take the blood sugar log to all doctor visits    Why is this important?    Checking your blood sugar at home helps to keep it from getting very high or very low.   Writing the results in a diary or log helps the doctor know how to care for you.   Your blood sugar log should have the time, date and the results.   Also, write down the amount of insulin or other medicine that you take.   Other information, like what you ate, exercise done and how you were feeling, will also be helpful.     Notes:   3/24 - Re-educated on proper diabetes management, including increase exercise and decreased carbs/sugars and sodium in diet  4/21 - Re-educated on proper diabetes diet, encouraged to increase exercise.  Reviewed recent blood sugars, encouraged to continue medication adherence in effort to decrease daily readings.  Today's reading was 210.    [name] Kitchen [facility] - Make and Keep All Appointments   On track      Timeframe:  Short-Term Goal Priority:  Medium Start Date:    4/21                  Expected End Date:       5/21    - call to cancel  if needed - keep a calendar with appointment dates    Why is this important?   Part of staying healthy is seeing the doctor for follow-up care.  If you forget your appointments, there are some things you can do to stay on track.    Notes:   11/24 - Upcoming appointments reviewed, encouraged to keep all dates  12/22 - Reminded of importance of attending all MD appointments  2/14 - Appointments reviewed, advised to secure transportation in effort to decrease risk of no show  3/24 - Reviewed recent visit with EMT, reminded to keep and attend all upcoming visits (4/18 with PCP)  4/21 - missed appointment with PCP on 4/18, conference call placed to reschedule.  New appointment is 5/6.  Encouraged to keep and attend office visit.       Valente David, [place], MSN [place] [phone]

## 2021-03-18 NOTE — Progress Notes (Signed)
Remote ICD transmission.   

## 2021-03-21 ENCOUNTER — Other Ambulatory Visit: Payer: Self-pay

## 2021-03-22 ENCOUNTER — Other Ambulatory Visit (HOSPITAL_COMMUNITY): Payer: Self-pay | Admitting: *Deleted

## 2021-03-22 ENCOUNTER — Other Ambulatory Visit: Payer: Self-pay

## 2021-03-22 ENCOUNTER — Telehealth (HOSPITAL_COMMUNITY): Payer: Self-pay | Admitting: Licensed Clinical Social Worker

## 2021-03-22 MED ORDER — SPIRONOLACTONE 25 MG PO TABS
25.0000 mg | ORAL_TABLET | Freq: Every day | ORAL | 11 refills | Status: DC
  Filled 2021-03-22: qty 30, 30d supply, fill #0

## 2021-03-22 MED FILL — Furosemide Tab 40 MG: ORAL | 30 days supply | Qty: 120 | Fill #0 | Status: AC

## 2021-03-22 MED FILL — Carvedilol Tab 25 MG: ORAL | 30 days supply | Qty: 60 | Fill #0 | Status: AC

## 2021-03-22 MED FILL — Glipizide Tab 10 MG: ORAL | 30 days supply | Qty: 60 | Fill #0 | Status: AC

## 2021-03-22 NOTE — Telephone Encounter (Signed)
Community Paramedic informed CSW that pt is not getting along with his sister who he lives with and they have decided he will move out at the end of May.  Pt needs assistance with locating affordable housing- looking for apartment/home for rent less than $800/month.  CSW created list of local housing rental options in that price range and Paramedic will take to patient during home visit today.  Will continue to follow and assist as needed  [name], [facility] Desk#: [phone] Cell#: [phone]

## 2021-03-23 ENCOUNTER — Other Ambulatory Visit (HOSPITAL_COMMUNITY): Payer: Self-pay

## 2021-03-23 ENCOUNTER — Other Ambulatory Visit: Payer: Self-pay

## 2021-03-23 NOTE — Progress Notes (Signed)
Paramedicine Encounter    Patient ID: [name], male    DOB: 1961-09-24, 60 y.o.   MRN: [id number]   Patient Care Team: Azzie Glatter, FNP (Inactive) as PCP - General (Family Medicine) [name], MD as PCP - Cardiology (Cardiology) [name], MD as PCP - Advanced Heart Failure (Cardiology) [name], MD as PCP - Electrophysiology (Cardiology) Valente David, RN as [place] Management [place], Connye Burkitt, LCSW as Education officer, museum (Licensed Clinical Social Worker)  Patient Active Problem List   Diagnosis Date Noted  . 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 Acute blood loss anemia   . Angiodysplasia of stomach   . Chronic anticoagulation   . CHF exacerbation ([place]) 06/15/2020  . AKI (acute kidney injury) ([place]) 06/15/2020  . CKD (chronic kidney disease), stage III ([place]) 06/15/2020  . Anemia due to chronic blood loss   . Gastric AVM   . Angiodysplasia of small intestine ([place])   . Acute on chronic systolic (congestive) heart failure ([place]) 04/05/2020  . Anemia 04/05/2020  . PAF (paroxysmal atrial fibrillation) ([place]) 04/05/2020  . OSA on CPAP 04/05/2020  . Syncope and collapse 04/05/2020  . Type 2 diabetes mellitus with hyperglycemia, with long-term current use of insulin ([place]) 12/03/2019  . Diabetic polyneuropathy associated with type 2 diabetes mellitus ([place]) 12/03/2019  . Hyperglycemia 12/03/2019  . History of hyperglycemia 12/03/2019  . Erectile dysfunction 12/03/2019  . CHF (congestive heart failure) ([place]) 10/11/2019  . Paranoid schizophrenia, chronic condition ([place]) 01/26/2015  . Severe recurrent major depressive disorder with psychotic features ([place]) 01/26/2015  . GAD (generalized anxiety disorder) 01/26/2015  . OCD (obsessive compulsive disorder) 01/26/2015  . Panic disorder without agoraphobia 01/26/2015  . Insomnia 01/26/2015    Current Outpatient Medications:  .  acetaminophen (TYLENOL) 500 MG  tablet, Take 2 tablets (1,000 mg total) by mouth in the morning and at bedtime., Disp: 180 tablet, Rfl: 3 .  albuterol (VENTOLIN HFA) 108 (90 Base) MCG/ACT inhaler, INHALE 2 PUFFS INTO THE LUNGS EVERY 6 (SIX) HOURS AS NEEDED FOR WHEEZING OR SHORTNESS OF BREATH., Disp: 8 g, Rfl: 3 .  apixaban (ELIQUIS) 5 MG TABS tablet, Take 1 tablet (5 mg total) by mouth 2 (two) times daily., Disp: 180 tablet, Rfl: 3 .  atorvastatin (LIPITOR) 40 MG tablet, Take 1 tablet (40 mg total) by mouth daily at 6 PM., Disp: 90 tablet, Rfl: 3 .  blood glucose meter kit and supplies KIT, Dispense based on patient and insurance preference. Use up to four times daily as directed. (FOR ICD-9 250.00, 250.01)., Disp: 1 each, Rfl: 0 .  buPROPion (WELLBUTRIN SR) 100 MG 12 hr tablet, TAKE 1 TABLET (100 MG TOTAL) BY MOUTH 2 (TWO) TIMES DAILY. (Patient not taking: Reported on 03/08/2021), Disp: 60 tablet, Rfl: 6 .  carvedilol (COREG) 25 MG tablet, TAKE 1 TABLET (25 MG TOTAL) BY MOUTH 2 (TWO) TIMES DAILY WITH A MEAL., Disp: 60 tablet, Rfl: 3 .  Continuous Blood Gluc Receiver (FREESTYLE LIBRE 14 DAY READER) DEVI, 1 each by Does not apply route as needed., Disp: 1 each, Rfl: 11 .  cyclobenzaprine (FLEXERIL) 10 MG tablet, Take 10 mg by mouth 3 (three) times daily as needed for muscle spasms., Disp: , Rfl:  .  ferrous sulfate 325 (65 FE) MG tablet, Take 325 mg by mouth in the morning and at bedtime., Disp: , Rfl:  .  furosemide (LASIX) 40 MG tablet, TAKE 2 TABLETS (80 MG TOTAL) BY MOUTH 2 (  TWO) TIMES DAILY., Disp: 360 tablet, Rfl: 3 .  gabapentin (NEURONTIN) 300 MG capsule, TAKE 2 CAPSULES (TOTAL = 600 MG), BY MOUTH, 3 TIMES A DAY., Disp: 180 capsule, Rfl: 11 .  glipiZIDE (GLUCOTROL) 10 MG tablet, TAKE 1 TABLET (10 MG TOTAL) BY MOUTH 2 (TWO) TIMES DAILY BEFORE A MEAL., Disp: 60 tablet, Rfl: 6 .  hydrALAZINE (APRESOLINE) 25 MG tablet, TAKE 1.5 TABLETS (37.5 MG TOTAL) BY MOUTH 3 (THREE) TIMES DAILY., Disp: 270 tablet, Rfl: 3 .  insulin glargine  (LANTUS) 100 UNIT/ML injection, INJECT 0.2 MLS (20 UNITS TOTAL) INTO THE SKIN DAILY., Disp: 30 mL, Rfl: 3 .  Insulin Lispro Prot & Lispro (HUMALOG MIX 75/25 KWIKPEN) (75-25) 100 UNIT/ML Kwikpen, Inject 30 Units into the skin 2 (two) times daily., Disp: 15 mL, Rfl: 11 .  Insulin Pen Needle 31G X 6 MM MISC, USE AS DIRECTED, Disp: 100 each, Rfl: 0 .  Insulin Syringe-Needle U-100 31G X 5/16" 0.3 ML MISC, USE 3 TIMES DAILY TO INJECT INSULIN, Disp: 300 each, Rfl: 3 .  isosorbide mononitrate (IMDUR) 30 MG 24 hr tablet, TAKE 0.5 TABLETS (15 MG TOTAL) BY MOUTH DAILY., Disp: 45 tablet, Rfl: 3 .  metFORMIN (GLUCOPHAGE) 500 MG tablet, TAKE 1 TABLET (500 MG TOTAL) BY MOUTH 2 (TWO) TIMES DAILY WITH A MEAL., Disp: 60 tablet, Rfl: 6 .  Multiple Vitamins-Minerals (CENTRUM SILVER 50+MEN) TABS, Take 1 tablet by mouth daily., Disp: , Rfl:  .  pantoprazole (PROTONIX) 40 MG tablet, TAKE 1 TABLET (40 MG TOTAL) BY MOUTH 2 (TWO) TIMES DAILY., Disp: 60 tablet, Rfl: 1 .  QUEtiapine (SEROQUEL) 300 MG tablet, TAKE 1 TABLET BY MOUTH AT BEDTIME., Disp: 90 tablet, Rfl: 0 .  sacubitril-valsartan (ENTRESTO) 97-103 MG, Take 1 tablet by mouth 2 (two) times daily., Disp: 60 tablet, Rfl: 5 .  sildenafil (VIAGRA) 100 MG tablet, TAKE 1 TABLET (100 MG TOTAL) BY MOUTH DAILY AS NEEDED FOR ERECTILE DYSFUNCTION., Disp: 30 tablet, Rfl: 2 .  spironolactone (ALDACTONE) 25 MG tablet, Take 1 tablet (25 mg total) by mouth daily., Disp: 30 tablet, Rfl: 11 .  tetrahydrozoline 0.05 % ophthalmic solution, Place 1 drop into both eyes daily., Disp: , Rfl:  .  TRUEPLUS PEN NEEDLES 31G X 6 MM MISC, USE AS DIRECTED, Disp: 100 each, Rfl: 0 No Known Allergies   Social History   Socioeconomic History  . Marital status: Legally Separated    Spouse name: Not on file  . Number of children: Not on file  . Years of education: Not on file  . Highest education level: Not on file  Occupational History  . Not on file  Tobacco Use  . 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 Smokeless tobacco: Never Used  Vaping Use  . Vaping Use: Never used  Substance and Sexual Activity  . Alcohol use: Yes    Alcohol/week: 2.0 standard drinks    Types: 2 Cans of beer per week  . Drug use: No  . Sexual activity: Yes  Other Topics Concern  . Not on file  Social History Narrative  . Not on file   Social Determinants of Health   Financial Resource Strain: Not on file  Food Insecurity: No Food Insecurity  . Worried About Charity fundraiser in the Last Year: Never true  . Ran Out of Food in the Last Year: Never true  Transportation Needs: Unmet Transportation Needs  . Lack of Transportation (Medical): Yes  . Lack of Transportation (Non-Medical): Yes  Physical Activity:  Not on file  Stress: Not on file  Social Connections: Not on file  Intimate Partner Violence: Not on file    Physical Exam  Met with [name] today for a medication review at his pharmacy. I reviewed all medications and verified same filling two pill boxes accordingly.    [name] was made aware of upcoming appointment with Dr. Aundra Dubin on Tuesday. [name] reports he will be there.   [name] called me earlier this week expressing the need for new housing because him and his sister aren't getting along and she wants him out. [name] was given a Holiday representative provided by Jeri Cos for possible housing opportunities in the area. [name] was grateful and will be calling around for same. [name] showed me his SSI letter reporting he will be getting around $2,000 each month starting in June. May's check will be around $846. [name] reports he will keep me updated about housing status. I will see [name] next week in clinic. Visit complete.    Refills: Eliquis Gabapentin Metformin Hydralazine       Future Appointments  Date Time Provider [place]  03/29/2021  2:20 PM [name], MD [facility] None  04/01/2021  1:00 PM [name], NP [name] None  04/14/2021  9:30 AM Valente David, RN [facility] None   06/03/2021  7:40 AM [facility] DEVICE REMOTES [facility]  09/02/2021  7:40 AM [facility] DEVICE REMOTES [facility]  12/02/2021  7:40 AM [facility] DEVICE REMOTES [facility]  03/03/2022  7:40 AM [facility] DEVICE REMOTES [facility]     ACTION: Home visit completed

## 2021-03-23 NOTE — Telephone Encounter (Signed)
Entered in error

## 2021-03-28 ENCOUNTER — Other Ambulatory Visit: Payer: Self-pay

## 2021-03-29 ENCOUNTER — Other Ambulatory Visit: Payer: Self-pay

## 2021-03-29 ENCOUNTER — Other Ambulatory Visit (HOSPITAL_COMMUNITY): Payer: Self-pay | Admitting: Cardiology

## 2021-03-29 ENCOUNTER — Encounter (HOSPITAL_COMMUNITY): Payer: Self-pay | Admitting: Cardiology

## 2021-03-29 ENCOUNTER — Ambulatory Visit (HOSPITAL_COMMUNITY)
Admission: RE | Admit: 2021-03-29 | Discharge: 2021-03-29 | Disposition: A | Discharge status: Home / Self Care | Payer: Medicare (Managed Care) | Source: Ambulatory Visit | Attending: Cardiology | Admitting: Cardiology

## 2021-03-29 ENCOUNTER — Other Ambulatory Visit (HOSPITAL_COMMUNITY): Payer: Self-pay

## 2021-03-29 VITALS — BP 120/78 | HR 60 | Wt 338.2 lb

## 2021-03-29 DIAGNOSIS — I110 Hypertensive heart disease with heart failure: Secondary | ICD-10-CM | POA: Insufficient documentation

## 2021-03-29 DIAGNOSIS — Z7984 Long term (current) use of oral hypoglycemic drugs: Secondary | ICD-10-CM | POA: Diagnosis not present

## 2021-03-29 DIAGNOSIS — G4733 Obstructive sleep apnea (adult) (pediatric): Secondary | ICD-10-CM | POA: Diagnosis not present

## 2021-03-29 DIAGNOSIS — I480 Paroxysmal atrial fibrillation: Secondary | ICD-10-CM | POA: Diagnosis not present

## 2021-03-29 DIAGNOSIS — Z87891 Personal history of nicotine dependence: Secondary | ICD-10-CM | POA: Diagnosis not present

## 2021-03-29 DIAGNOSIS — I2510 Atherosclerotic heart disease of native coronary artery without angina pectoris: Secondary | ICD-10-CM | POA: Diagnosis not present

## 2021-03-29 DIAGNOSIS — Z7901 Long term (current) use of anticoagulants: Secondary | ICD-10-CM | POA: Insufficient documentation

## 2021-03-29 DIAGNOSIS — Z794 Long term (current) use of insulin: Secondary | ICD-10-CM | POA: Diagnosis not present

## 2021-03-29 DIAGNOSIS — E119 Type 2 diabetes mellitus without complications: Secondary | ICD-10-CM | POA: Insufficient documentation

## 2021-03-29 DIAGNOSIS — Z6841 Body Mass Index (BMI) 40.0 and over, adult: Secondary | ICD-10-CM | POA: Diagnosis not present

## 2021-03-29 DIAGNOSIS — Z955 Presence of coronary angioplasty implant and graft: Secondary | ICD-10-CM | POA: Insufficient documentation

## 2021-03-29 DIAGNOSIS — E785 Hyperlipidemia, unspecified: Secondary | ICD-10-CM | POA: Diagnosis not present

## 2021-03-29 DIAGNOSIS — E669 Obesity, unspecified: Secondary | ICD-10-CM | POA: Insufficient documentation

## 2021-03-29 DIAGNOSIS — Z79899 Other long term (current) drug therapy: Secondary | ICD-10-CM | POA: Insufficient documentation

## 2021-03-29 DIAGNOSIS — I5022 Chronic systolic (congestive) heart failure: Secondary | ICD-10-CM | POA: Diagnosis present

## 2021-03-29 DIAGNOSIS — Z8249 Family history of ischemic heart disease and other diseases of the circulatory system: Secondary | ICD-10-CM | POA: Insufficient documentation

## 2021-03-29 LAB — BASIC METABOLIC PANEL
Anion gap: 8 (ref 5–15)
BUN: 16 mg/dL (ref 6–20)
CO2: 27 mmol/L (ref 22–32)
Calcium: 8.3 mg/dL — ABNORMAL LOW (ref 8.9–10.3)
Chloride: 106 mmol/L (ref 98–111)
Creatinine, Ser: 1.25 mg/dL — ABNORMAL HIGH (ref 0.61–1.24)
GFR, Estimated: >60 mL/min (ref >60)
Glucose, Bld: 51 mg/dL — ABNORMAL LOW (ref 70–99)
Potassium: 3.8 mmol/L (ref 3.5–5.1)
Sodium: 141 mmol/L (ref 135–145)

## 2021-03-29 LAB — LIPID PANEL
Cholesterol: 95 mg/dL (ref 0–200)
HDL: 37 mg/dL — ABNORMAL LOW (ref >40)
LDL Cholesterol: 39 mg/dL (ref 0–99)
Total CHOL/HDL Ratio: 2.6 RATIO
Triglycerides: 96 mg/dL (ref <150)
VLDL: 19 mg/dL (ref 0–40)

## 2021-03-29 MED ORDER — ISOSORBIDE MONONITRATE ER 30 MG PO TB24
30.0000 mg | ORAL_TABLET | Freq: Every day | ORAL | 3 refills | Status: DC
  Filled 2021-03-29: qty 30, 30d supply, fill #0

## 2021-03-29 MED ORDER — EMPAGLIFLOZIN 25 MG PO TABS
ORAL_TABLET | ORAL | 0 refills | Status: DC
  Filled 2021-03-29: qty 30, 30d supply, fill #0

## 2021-03-29 MED ORDER — FUROSEMIDE 80 MG PO TABS
ORAL_TABLET | ORAL | 3 refills | Status: DC
Start: 2021-03-29 — End: 2021-04-19
  Filled 2021-03-29: qty 75, 30d supply, fill #0

## 2021-03-29 MED ORDER — DAPAGLIFLOZIN PROPANEDIOL 10 MG PO TABS
10.0000 mg | ORAL_TABLET | Freq: Every day | ORAL | 11 refills | Status: DC
Start: 2021-03-29 — End: 2021-12-12
  Filled 2021-03-29 – 2021-03-31 (×2): qty 30, 30d supply, fill #0
  Filled 2021-04-26 – 2021-07-11 (×4): qty 30, 30d supply, fill #1

## 2021-03-29 MED FILL — Metformin HCl Tab 500 MG: ORAL | 30 days supply | Qty: 60 | Fill #0 | Status: AC

## 2021-03-29 MED FILL — Gabapentin Cap 300 MG: ORAL | 30 days supply | Qty: 180 | Fill #1 | Status: AC

## 2021-03-29 MED FILL — Hydralazine HCl Tab 25 MG: ORAL | 30 days supply | Qty: 135 | Fill #0 | Status: AC

## 2021-03-29 NOTE — Progress Notes (Signed)
CSW met with pt in clinic to discuss housing concerns.  Pt was staying with family member but moved out due to conflict.  Staying in a motel with a friend currently but worried that they will not be able to pay for this much longer.  Pt had received $14,000 in a lump sum due to back payments but spent it all to pay off debts- is no out of money.  Pt normally getting $2,035/month but hasn't received a check since the $14,000.  CSW assisted pt in contacting [place] who states that pt has not been receiving a check because his money was being taken to pay for outstanding Medicare charges totaling [phone].  Pt will not receive any income until 6/3 and that check will only be $60 he will start receiving his full check again on 7/3 in the amount of $2,035.  There is no way to adjust when he gets his payments per SSA worker because Medicare is separate from them.  Until then he has no income.  Pt states that he has no one he can stay with.  His niece who acts as his payee has 5 kids and pt states has no space for him to stay.  Has some other family and friends in the area but does not think he can stay with any of them.  CSW encouraged pt to ask around to try and find living arrangements while he awaits return of his income as only other option might be shelter.    CSW will start looking into assistance options but informed pt that temporary shelter might be his only avenue at this time  Will continue to follow and assist as needed

## 2021-03-29 NOTE — Progress Notes (Signed)
Paramedicine Encounter    Patient ID: [name], male    DOB: [birth year], 60 y.o.   MRN: [id number]  Met with [name] in clinic today with Dr. Aundra Dubin who increased his Lasix to 148m AM and 889mPM, Isosorbide to 3078mM and added Jardiance 95m22mily. I updated pill box with this change and reached out to pharmacy for refills and Jardiance fill. I will pick up tomorrow and verify his pillbox.    [name] with [name] about housing and SSI income.   Visit complete. I will see ChriGerald Stabsorrow or Thursday for med rec.     ACTION: Next visit planned for this week

## 2021-03-29 NOTE — Patient Instructions (Signed)
INCREASE Lasix to 120 mg (one and one half tab) in the AM and 80 mg (one tab) in the PM INCREASE Imdur to 30 mg, one tab daily START Farxiga 10 mg ,one tab daily  Labs today We will only contact you if something comes back abnormal or we need to make some changes. Otherwise no news is good news!  Labs needed in 7- 10 days   Your physician recommends that you schedule a follow-up appointment in: 1 month  in the Advanced Practitioners (PA/NP) Clinic   Do the following things EVERYDAY: 1) Weigh yourself in the morning before breakfast. Write it down and keep it in a log. 2) Take your medicines as prescribed 3) Eat low salt foods--Limit salt (sodium) to 2000 mg per day.  4) Stay as active as you can everyday 5) Limit all fluids for the day to less than 2 liters  At the [facility], you and your health needs are our priority. As part of our continuing mission to provide you with exceptional heart care, we have created designated Provider Care Teams. These Care Teams include your primary Cardiologist (physician) and Advanced Practice Providers (APPs- Physician Assistants and Nurse Practitioners) who all work together to provide you with the care you need, when you need it.   You may see any of the following providers on your designated Care Team at your next follow up: [name] Kitchen Dr Glori Bickers . Dr Loralie Champagne . Dr Vickki Muff . Darrick Grinder, NP . Lyda Jester, [place] . [name], PharmD   Please be sure to bring in all your medications bottles to every appointment.   If you have any questions or concerns before your next appointment please send [place] a message through [place] or call our office at [phone].    TO LEAVE A MESSAGE FOR THE NURSE SELECT OPTION 2, PLEASE LEAVE A MESSAGE INCLUDING: . YOUR NAME . DATE OF BIRTH . CALL BACK NUMBER . REASON FOR CALL**this is important as we prioritize the call backs  YOU WILL RECEIVE A CALL BACK  THE SAME DAY AS LONG AS YOU CALL BEFORE 4:00 PM

## 2021-03-30 NOTE — Progress Notes (Signed)
PCP: Azzie Glatter, FNP (Inactive) Cardiology: Dr. Radford Pax HF Cardiology: Dr. Aundra Dubin  60 y.o. with history of chronic systolic CHF, CAD, type 2 diabetes, paroxysmal atrial fibrillation, and schizophrenia was referred by Dr. Radford Pax for evaluation of CHF.  Patient had OM2 PCI in 2012.  In 11/20, he was admitted with CHF. Echo showed EF 25-30% with diffuse hypokinesis.  LHC was done, showing occluded OM2 at prior stent, 90% D1 stenosis, and extensive diffuse RCA disease.  No intervention.  He was thought to be in paroxysmal atrial fibrillation during this appointment and apixaban was started.   He does not smoke, rarely drinks, and does not use drugs.  His mother had "heart problems."    He can write his name only. He is only able to read a few words. Thinks he completed the 7th grade.    Echo in 5/21 showed EF 30% with diffuse hypokinesis, mild LVH, PASP 38, mildly decreased RV systolic function, IVC dilated. Biotronik ICD placed.   He was hospitalized in 7/21 with upper GI bleeding and CHF exacerbation.  EGD showed duodenal AVMs, treated with APC. He was diuresed.   He returns for followup of CHF.  He is being followed by paramedicine.  Weight is up 19 lbs.  He is living in a hotel currently and eats fast food for most meals.  Not using CPAP (keeps it at his sister's house).  He is short of breath after walking 100 yards or walking up stairs.  No chest pain.  No orthopnea/PND.  He takes Viagra rarely, he knows to stop Imdur prior to and after using it.   Labs (1/21): LDL 66, HDL 42, hgb 11.3, K 4.7, creatinine 1.32 Labs (4/21): K 5, creatinine 1.37 Labs (7/21): K 4.1, creatinine 1.26, hgb 9.3 Labs (8/21): LDL 51, K 3.8, creatinine 1.3 Labs (11/21): K 4, creatinine 1.42, hgb 13.1, hgbA1c 11.7 Labs (2/22): HgbA1c 7.7 Labs (3/22): K 4.4, creatinine 1.27, pro-BNP 249  PMH:  1. Atrial fibrillation: Paroxysmal 2. Type 2 diabetes 3. HTN 4. Hyperlipidemia 5. Schizophrenia 6. CAD: PCI OM2 in  2012.  - LHC (11/20): 90% D1 stenosis, totally occluded OM2 at stent, serial 85%/70%/60% RCA stenoses.  7. 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  Biotronik ICD.  - Echo (11/20): EF 25-30%, global hypokinesis.  - Echo (5/21): EF 30% with diffuse hypokinesis, mild LVH, PASP 38, mildly decreased RV systolic function, IVC dilated. 8. Upper GI bleeding: 7/21, duodenal AVMs treated with APC.  9. OSA: Does not use CPAP regularly.   Social History   Socioeconomic History  . Marital status: Legally Separated    Spouse name: Not on file  . Number of children: Not on file  . Years of education: Not on file  . Highest education level: Not on file  Occupational History  . Not on file  Tobacco Use  . 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 Smokeless tobacco: Never Used  Vaping Use  . Vaping Use: Never used  Substance and Sexual Activity  . Alcohol use: Yes    Alcohol/week: 2.0 standard drinks    Types: 2 Cans of beer per week  . Drug use: No  . Sexual activity: Yes  Other Topics Concern  . Not on file  Social History Narrative  . Not on file   Social Determinants of Health   Financial Resource Strain: Not on file  Food Insecurity: No Food Insecurity  . Worried About Charity fundraiser in the Last Year: Never true  .  Ran Out of Food in the Last Year: Never true  Transportation Needs: Unmet Transportation Needs  . Lack of Transportation (Medical): Yes  . Lack of Transportation (Non-Medical): Yes  Physical Activity: Not on file  Stress: Not on file  Social Connections: Not on file  Intimate Partner Violence: Not on file   Family History  Problem Relation Age of Onset  . Heart failure Mother   . Mental illness Sister   . Mental illness Sister    ROS: All systems reviewed and negative except as per HPI.  Current Meds  Medication Sig  . albuterol (VENTOLIN HFA) 108 (90 Base) MCG/ACT inhaler INHALE 2 PUFFS INTO THE LUNGS EVERY 6 (SIX) HOURS AS NEEDED  FOR WHEEZING OR SHORTNESS OF BREATH.  [name] Kitchen apixaban (ELIQUIS) 5 MG TABS tablet Take 1 tablet (5 mg total) by mouth 2 (two) times daily.  [name] Kitchen atorvastatin (LIPITOR) 40 MG tablet Take 1 tablet (40 mg total) by mouth daily at 6 PM.  . blood glucose meter kit and supplies KIT Dispense based on patient and insurance preference. Use up to four times daily as directed. (FOR ICD-9 250.00, 250.01).  [name] Kitchen buPROPion (WELLBUTRIN SR) 100 MG 12 hr tablet TAKE 1 TABLET (100 MG TOTAL) BY MOUTH 2 (TWO) TIMES DAILY.  . carvedilol (COREG) 25 MG tablet TAKE 1 TABLET (25 MG TOTAL) BY MOUTH 2 (TWO) TIMES DAILY WITH A MEAL.  [name] Kitchen Continuous Blood Gluc Receiver (FREESTYLE LIBRE 14 DAY READER) DEVI 1 each by Does not apply route as needed.  . cyclobenzaprine (FLEXERIL) 10 MG tablet Take 10 mg by mouth 3 (three) times daily as needed for muscle spasms.  . dapagliflozin propanediol (FARXIGA) 10 MG TABS tablet Take 1 tablet (10 mg total) by mouth daily before breakfast.  . ferrous sulfate 325 (65 FE) MG tablet Take 325 mg by mouth in the morning and at bedtime.  . gabapentin (NEURONTIN) 300 MG capsule TAKE 2 CAPSULES (TOTAL = 600 MG), BY MOUTH, 3 TIMES A DAY.  [name] Kitchen glipiZIDE (GLUCOTROL) 10 MG tablet TAKE 1 TABLET (10 MG TOTAL) BY MOUTH 2 (TWO) TIMES DAILY BEFORE A MEAL.  . hydrALAZINE (APRESOLINE) 25 MG tablet TAKE 1.5 TABLETS (37.5 MG TOTAL) BY MOUTH 3 (THREE) TIMES DAILY.  [name] Kitchen insulin glargine (LANTUS) 100 UNIT/ML injection INJECT 0.2 MLS (20 UNITS TOTAL) INTO THE SKIN DAILY.  [name] Kitchen Insulin Lispro Prot & Lispro (HUMALOG MIX 75/25 KWIKPEN) (75-25) 100 UNIT/ML Kwikpen Inject 30 Units into the skin 2 (two) times daily.  . Insulin Pen Needle 31G X 6 MM MISC USE AS DIRECTED  . Insulin Syringe-Needle U-100 31G X 5/16" 0.3 ML MISC USE 3 TIMES DAILY TO INJECT INSULIN  . metFORMIN (GLUCOPHAGE) 500 MG tablet TAKE 1 TABLET (500 MG TOTAL) BY MOUTH 2 (TWO) TIMES DAILY WITH A MEAL.  . Multiple Vitamins-Minerals (CENTRUM SILVER 50+MEN) TABS Take 1 tablet by  mouth daily.  . pantoprazole (PROTONIX) 40 MG tablet TAKE 1 TABLET (40 MG TOTAL) BY MOUTH 2 (TWO) TIMES DAILY.  [name] Kitchen QUEtiapine (SEROQUEL) 300 MG tablet TAKE 1 TABLET BY MOUTH AT BEDTIME.  . sacubitril-valsartan (ENTRESTO) 97-103 MG Take 1 tablet by mouth 2 (two) times daily.  . sildenafil (VIAGRA) 100 MG tablet TAKE 1 TABLET (100 MG TOTAL) BY MOUTH DAILY AS NEEDED FOR ERECTILE DYSFUNCTION.  [name] Kitchen spironolactone (ALDACTONE) 25 MG tablet Take 1 tablet (25 mg total) by mouth daily.  . TRUEPLUS PEN NEEDLES 31G X 6 MM MISC USE AS DIRECTED  . [DISCONTINUED] furosemide (LASIX) 40 MG tablet  TAKE 2 TABLETS (80 MG TOTAL) BY MOUTH 2 (TWO) TIMES DAILY.  . [DISCONTINUED] isosorbide mononitrate (IMDUR) 30 MG 24 hr tablet TAKE 0.5 TABLETS (15 MG TOTAL) BY MOUTH DAILY.   BP 120/78   Pulse 60   Wt (!) 153.4 kg (338 lb 3.2 oz)   SpO2 95%   BMI 43.42 kg/m   Wt Readings from Last 3 Encounters:  03/29/21 (!) 153.4 kg (338 lb 3.2 oz)  03/23/21 (!) 151.5 kg (334 lb)  03/08/21 (!) 146.6 kg (323 lb 3.2 oz)   .med Vitals:   03/29/21 1457  BP: 120/78  Pulse: 60  SpO2: 95%   General: NAD, obese.  Neck: JVP 8-9 cm, no thyromegaly or thyroid nodule.  Lungs: Clear to auscultation bilaterally with normal respiratory effort. CV: Nondisplaced PMI.  Heart regular S1/S2, no S3/S4, no murmur.  1+ edema to knees.  No carotid bruit.  Normal pedal pulses.  Abdomen: Soft, nontender, no hepatosplenomegaly, no distention.  Skin: Intact without lesions or rashes.  Neurologic: Alert and oriented x 3.  Psych: Normal affect. Extremities: No clubbing or cyanosis.  HEENT: Normal.   Assessment/Plan: 1. Chronic systolic CHF: Echo in 30/07 with EF 25-30%, echo 5/21 with EF 30% with mildly decreased RV systolic function. Biotronik ICD.  Suspect mixed ischemic/nonischemic cardiomyopathy.  Weight is up, NYHA class II, volume overload on exam.  - Increase Lasix to 120 qam/80 qpm. BMET today and in 10 days.  - Continue Entresto 97/103  bid.  - Continue Coreg 25 mg bid.  - Continue spironolactone 25 daily.   - Continue hydralazine 37.5 mg tid, increase Imdur to 30 mg daily.  - HgbA1c under better control, start Farxiga 10 mg daily.  BMET 10 days.   - Reinforced low sodium diet.  2. CAD: Last cath in 11/20 with occluded OM2 stent, 90% D1, severe diffuse disease in the RCA (no intervention). No recent chest pain.  - No ASA given apixaban use.  - Continue atorvastatin. Good lipids in 8/21.  - We discussed healthier dietary choices.  3. Atrial fibrillation: Paroxysmal.  He is in NSR by exam today. Denies overt bleeding.  - Continue apixaban 5 mg bid.  4. DMII: Continue home regimen.  5. OSA: Needs to retrieve CPAP from sister's house, does not do him much good there.  6. Upper GI bleeding: Occurred in 7/21.  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  No overt bleeding.   7. Obesity: Weight up today.  - Increase exercise.  - Discussed healthier eating choices.   Social worker to talk to him today about housing.   Followup in 1 month with APP.    Loralie Champagne 03/30/2021

## 2021-03-31 ENCOUNTER — Other Ambulatory Visit (HOSPITAL_COMMUNITY): Payer: Self-pay

## 2021-03-31 ENCOUNTER — Other Ambulatory Visit: Payer: Self-pay

## 2021-03-31 ENCOUNTER — Telehealth (HOSPITAL_COMMUNITY): Payer: Self-pay | Admitting: Licensed Clinical Social Worker

## 2021-03-31 NOTE — Telephone Encounter (Signed)
Community Paramedic reports pt unable to afford his medications ($78) at this time due to financial issues documented in CSW previous note.  CSW able to assist pt with obtaining medications at this time- provided to paramedic who will complete home visit today and provide pt with medications at that time.  Will continue to follow and assist as needed  [name], [facility] Desk#: [phone] Cell#: [phone]

## 2021-03-31 NOTE — Progress Notes (Signed)
Paramedicine Encounter    Patient ID: [name], male    DOB: 02-Apr-1961, 60 y.o.   MRN: [id number]  Met with [name] today to verify and reconcile his medications. I reviewed medications with updated changes per Dr. Aundra Dubin and filled one pill box accordingly. I will see [name] in one week for regular weekly visit. Visit complete.  ACTION: Home visit completed Next visit planned for one week

## 2021-04-01 ENCOUNTER — Other Ambulatory Visit: Payer: Self-pay

## 2021-04-01 ENCOUNTER — Encounter: Payer: Self-pay | Admitting: Nurse Practitioner

## 2021-04-01 ENCOUNTER — Ambulatory Visit (INDEPENDENT_AMBULATORY_CARE_PROVIDER_SITE_OTHER): Payer: Medicare (Managed Care) | Admitting: Nurse Practitioner

## 2021-04-01 VITALS — BP 111/66 | HR 63 | Temp 98.2°F | Ht 74.0 in | Wt 336.0 lb

## 2021-04-01 DIAGNOSIS — Z794 Long term (current) use of insulin: Secondary | ICD-10-CM | POA: Diagnosis not present

## 2021-04-01 DIAGNOSIS — F200 Paranoid schizophrenia: Secondary | ICD-10-CM | POA: Diagnosis not present

## 2021-04-01 DIAGNOSIS — J45909 Unspecified asthma, uncomplicated: Secondary | ICD-10-CM

## 2021-04-01 DIAGNOSIS — R0600 Dyspnea, unspecified: Secondary | ICD-10-CM | POA: Diagnosis not present

## 2021-04-01 DIAGNOSIS — E1165 Type 2 diabetes mellitus with hyperglycemia: Secondary | ICD-10-CM

## 2021-04-01 DIAGNOSIS — K219 Gastro-esophageal reflux disease without esophagitis: Secondary | ICD-10-CM | POA: Diagnosis not present

## 2021-04-01 DIAGNOSIS — R0609 Other forms of dyspnea: Secondary | ICD-10-CM

## 2021-04-01 DIAGNOSIS — R062 Wheezing: Secondary | ICD-10-CM

## 2021-04-01 LAB — POCT URINALYSIS DIPSTICK
Bilirubin, UA: NEGATIVE
Blood, UA: NEGATIVE
Glucose, UA: POSITIVE — AB
Ketones, UA: NEGATIVE
Leukocytes, UA: NEGATIVE
Nitrite, UA: NEGATIVE
Protein, UA: NEGATIVE
Spec Grav, UA: 1.01 (ref 1.010–1.025)
Urobilinogen, UA: 0.2 E.U./dL
pH, UA: 6 (ref 5.0–8.0)

## 2021-04-01 LAB — POCT GLYCOSYLATED HEMOGLOBIN (HGB A1C)
HbA1c POC (<> result, manual entry): 7.1 % (ref 4.0–5.6)
HbA1c, POC (controlled diabetic range): 7.1 % — AB (ref 0.0–7.0)
HbA1c, POC (prediabetic range): 7.1 % — AB (ref 5.7–6.4)
Hemoglobin A1C: 7.1 % — AB (ref 4.0–5.6)

## 2021-04-01 MED ORDER — VIAGRA 25 MG PO TABS: 100.0000 mg | ORAL_TABLET | Freq: Every day | ORAL | 0 refills | Status: DC | PRN

## 2021-04-01 MED ORDER — ALBUTEROL SULFATE (2.5 MG/3ML) 0.083% IN NEBU
2.5000 mg | INHALATION_SOLUTION | RESPIRATORY_TRACT | 2 refills | Status: DC | PRN
  Filled 2021-04-01 – 2021-04-18 (×2): qty 75, 5d supply, fill #0

## 2021-04-01 NOTE — Patient Instructions (Signed)
Diabetes Mellitus and Nutrition, Adult When you have diabetes, or diabetes mellitus, it is very important to have healthy eating habits because your blood sugar (glucose) levels are greatly affected by what you eat and drink. Eating healthy foods in the right amounts, at about the same times every day, can help you:  Control your blood glucose.  Lower your risk of heart disease.  Improve your blood pressure.  Reach or maintain a healthy weight. What can affect my meal plan? Every person with diabetes is different, and each person has different needs for a meal plan. Your health care provider may recommend that you work with a dietitian to make a meal plan that is best for you. Your meal plan may vary depending on factors such as:  The calories you need.  The medicines you take.  Your weight.  Your blood glucose, blood pressure, and cholesterol levels.  Your activity level.  Other health conditions you have, such as heart or kidney disease. How do carbohydrates affect me? Carbohydrates, also called carbs, affect your blood glucose level more than any other type of food. Eating carbs naturally raises the amount of glucose in your blood. Carb counting is a method for keeping track of how many carbs you eat. Counting carbs is important to keep your blood glucose at a healthy level, especially if you use insulin or take certain oral diabetes medicines. It is important to know how many carbs you can safely have in each meal. This is different for every person. Your dietitian can help you calculate how many carbs you should have at each meal and for each snack. How does alcohol affect me? Alcohol can cause a sudden decrease in blood glucose (hypoglycemia), especially if you use insulin or take certain oral diabetes medicines. Hypoglycemia can be a life-threatening condition. Symptoms of hypoglycemia, such as sleepiness, dizziness, and confusion, are similar to symptoms of having too much  alcohol.  Do not drink alcohol if: ? Your health care provider tells you not to drink. ? You are pregnant, may be pregnant, or are planning to become pregnant.  If you drink alcohol: ? Do not drink on an empty stomach. ? Limit how much you use to:  0-1 drink a day for women.  0-2 drinks a day for men. ? Be aware of how much alcohol is in your drink. In the [place] one drink equals one 12 oz bottle of beer (355 mL), one 5 oz glass of wine (148 mL), or one 1 oz glass of hard liquor (44 mL). ? Keep yourself hydrated with water, diet soda, or unsweetened iced tea.  Keep in mind that regular soda, juice, and other mixers may contain a lot of sugar and must be counted as carbs. What are tips for following this plan? Reading food labels  Start by checking the serving size on the "Nutrition Facts" label of packaged foods and drinks. The amount of calories, carbs, fats, and other nutrients listed on the label is based on one serving of the item. Many items contain more than one serving per package.  Check the total grams (g) of carbs in one serving. You can calculate the number of servings of carbs in one serving by dividing the total carbs by 15. For example, if a food has 30 g of total carbs per serving, it would be equal to 2 servings of carbs.  Check the number of grams (g) of saturated fats and trans fats in one serving. Choose foods that have   a low amount or none of these fats.  Check the number of milligrams (mg) of salt (sodium) in one serving. Most people should limit total sodium intake to less than 2,300 mg per day.  Always check the nutrition information of foods labeled as "low-fat" or "nonfat." These foods may be higher in added sugar or refined carbs and should be avoided.  Talk to your dietitian to identify your daily goals for nutrients listed on the label. Shopping  Avoid buying canned, pre-made, or processed foods. These foods tend to be high in fat, sodium, and added  sugar.  Shop around the outside edge of the grocery store. This is where you will most often find fresh fruits and vegetables, bulk grains, fresh meats, and fresh dairy. Cooking  Use low-heat cooking methods, such as baking, instead of high-heat cooking methods like deep frying.  Cook using healthy oils, such as olive, canola, or sunflower oil.  Avoid cooking with butter, cream, or high-fat meats. Meal planning  Eat meals and snacks regularly, preferably at the same times every day. Avoid going long periods of time without eating.  Eat foods that are high in fiber, such as fresh fruits, vegetables, beans, and whole grains. Talk with your dietitian about how many servings of carbs you can eat at each meal.  Eat 4-6 oz (112-168 g) of lean protein each day, such as lean meat, chicken, fish, eggs, or tofu. One ounce (oz) of lean protein is equal to: ? 1 oz (28 g) of meat, chicken, or fish. ? 1 egg. ?  cup (62 g) of tofu.  Eat some foods each day that contain healthy fats, such as avocado, nuts, seeds, and fish.   What foods should I eat? Fruits Berries. Apples. Oranges. Peaches. Apricots. Plums. Grapes. Mango. Papaya. Pomegranate. Kiwi. Cherries. Vegetables Lettuce. Spinach. Leafy greens, including kale, chard, collard greens, and mustard greens. Beets. Cauliflower. Cabbage. Broccoli. Carrots. Green beans. Tomatoes. Peppers. Onions. Cucumbers. Brussels sprouts. Grains Whole grains, such as whole-wheat or whole-grain bread, crackers, tortillas, cereal, and pasta. Unsweetened oatmeal. Quinoa. Brown or wild rice. Meats and other proteins Seafood. Poultry without skin. Lean cuts of poultry and beef. Tofu. Nuts. Seeds. Dairy Low-fat or fat-free dairy products such as milk, yogurt, and cheese. The items listed above may not be a complete list of foods and beverages you can eat. Contact a dietitian for more information. What foods should I avoid? Fruits Fruits canned with  syrup. Vegetables Canned vegetables. Frozen vegetables with butter or cream sauce. Grains Refined white flour and flour products such as bread, pasta, snack foods, and cereals. Avoid all processed foods. Meats and other proteins Fatty cuts of meat. Poultry with skin. Breaded or fried meats. Processed meat. Avoid saturated fats. Dairy Full-fat yogurt, cheese, or milk. Beverages Sweetened drinks, such as soda or iced tea. The items listed above may not be a complete list of foods and beverages you should avoid. Contact a dietitian for more information. Questions to ask a health care provider  Do I need to meet with a diabetes educator?  Do I need to meet with a dietitian?  What number can I call if I have questions?  When are the best times to check my blood glucose? Where to find more information:  American Diabetes Association: diabetes.org  Academy of Nutrition and Dietetics: www.eatright.org  National Institute of Diabetes and Digestive and Kidney Diseases: www.niddk.nih.gov  Association of Diabetes Care and Education Specialists: www.diabeteseducator.org Summary  It is important to have healthy eating   habits because your blood sugar (glucose) levels are greatly affected by what you eat and drink.  A healthy meal plan will help you control your blood glucose and maintain a healthy lifestyle.  Your health care provider may recommend that you work with a dietitian to make a meal plan that is best for you.  Keep in mind that carbohydrates (carbs) and alcohol have immediate effects on your blood glucose levels. It is important to count carbs and to use alcohol carefully. This information is not intended to replace advice given to you by your health care provider. Make sure you discuss any questions you have with your health care provider. Document Revised: 10/21/2019 Document Reviewed: 10/21/2019 Elsevier Patient Education  2021 [employer].  

## 2021-04-01 NOTE — Progress Notes (Signed)
[place], [place]  [address] Phone:  [phone]   Fax:  [phone]   Established Patient Office Visit  Subjective:  Patient ID: [name], male    DOB: 1960-12-02  Age: 60 y.o. MRN: [id number]  CC:  Chief Complaint  Patient presents with  . Follow-up    6 month follow up     HPI [name] presents for follow up. A former patient of NP [place]oud. He  has a past medical history of Anxiety, CAD (coronary artery disease), CHF (congestive heart failure) ([place]) (09/2019), Depression, Diabetes mellitus, Erectile dysfunction (11/2019), H/O right heart catheterization (09/2019), Hypertension, Schizophrenia ([place]), and Sleep apnea.   Diabetes Mellitus Patient presents for follow up of diabetes. Current symptoms include: hyperglycemia and paresthesia of the feet. Symptoms have stabilized. Patient denies hypoglycemia , nausea, polydipsia, polyuria, visual disturbances and vomiting. Evaluation to date has included: fasting blood sugar, fasting lipid panel, hemoglobin A1C and microalbuminuria.  Home sugars: BGs range between 112 and 300. Current treatment: more intensive attention to diet which has been somewhat effective, Continued insulin which has been somewhat effective, Continued metformin which has been effective, Continued statin which has been effective, Continued ACE inhibitor/ARB which has been effective and Continued dapagliflozin which has been effective.   He reports being seen by cardiology for CAD and HF recently with changes to diuretic due to increased swelling. He reports some effectiveness.   He also reports ongoing wheezing and shortness of breath. He had discussed nebulizer machine for in home breathing treatments however there has not been any progress in this area.   He has some increased stress because he has been displaced from his home and is residing in a hotel. This is getting expensive. He is actively working  on with case worker and applying.    Past Medical History:  Diagnosis Date  . Anxiety   . CAD (coronary artery disease)   . CHF (congestive heart failure) ([place]) 09/2019  . Depression   . Diabetes mellitus   . Erectile dysfunction 11/2019  . H/O right heart catheterization 09/2019  . Hypertension   . Schizophrenia ([place])   . Sleep apnea    uses cpap    Past Surgical History:  Procedure Laterality Date  . CORONARY STENT PLACEMENT    . ENTEROSCOPY N/A 04/08/2020   Procedure: ENTEROSCOPY;  Surgeon: Doran Stabler, MD;  Location: [facility] ENDOSCOPY;  Service: Gastroenterology;  Laterality: N/A;  . ENTEROSCOPY N/A 06/17/2020   Procedure: ENTEROSCOPY;  Surgeon: Irene Shipper, MD;  Location: [facility] ENDOSCOPY;  Service: Endoscopy;  Laterality: N/A;  . HEMOSTASIS CLIP PLACEMENT  04/08/2020   Procedure: HEMOSTASIS CLIP PLACEMENT;  Surgeon: Doran Stabler, MD;  Location: [facility];  Service: Gastroenterology;;  . HEMOSTASIS CONTROL  04/08/2020   Procedure: HEMOSTASIS CONTROL;  Surgeon: Doran Stabler, MD;  Location: [facility] ENDOSCOPY;  Service: Gastroenterology;;  . Laretta Alstrom CONTROL  06/17/2020   Procedure: HEMOSTASIS CONTROL;  Surgeon: Irene Shipper, MD;  Location: [facility] ENDOSCOPY;  Service: Endoscopy;;  . HERNIA REPAIR    . ICD IMPLANT N/A 09/03/2020   Procedure: ICD IMPLANT;  Surgeon: [name], MD;  Location: [facility] CV LAB;  Service: Cardiovascular;  Laterality: N/A;  . RIGHT/LEFT HEART CATH AND CORONARY ANGIOGRAPHY N/A 10/14/2019   Procedure: RIGHT/LEFT HEART CATH AND CORONARY ANGIOGRAPHY;  Surgeon: Belva Crome, MD;  Location: [facility];  Service: Cardiovascular;  Laterality: N/A;  Family History  Problem Relation Age of Onset  . Heart failure Mother   . Mental illness Sister   . Mental illness Sister     Social History   Socioeconomic History  . Marital status: Legally Separated    Spouse name: Not on file  . Number of children: Not on file  . Years of  education: Not on file  . Highest education level: Not on file  Occupational History  . Not on file  Tobacco Use  . 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 Smokeless tobacco: Never Used  Vaping Use  . Vaping Use: Never used  Substance and Sexual Activity  . Alcohol use: Not Currently    Alcohol/week: 2.0 standard drinks    Types: 2 Cans of beer per week  . Drug use: No  . Sexual activity: Yes  Other Topics Concern  . Not on file  Social History Narrative  . Not on file   Social Determinants of Health   Financial Resource Strain: Not on file  Food Insecurity: No Food Insecurity  . Worried About Charity fundraiser in the Last Year: Never true  . Ran Out of Food in the Last Year: Never true  Transportation Needs: Unmet Transportation Needs  . Lack of Transportation (Medical): Yes  . Lack of Transportation (Non-Medical): Yes  Physical Activity: Not on file  Stress: Not on file  Social Connections: Not on file  Intimate Partner Violence: Not on file    Outpatient Medications Prior to Visit  Medication Sig Dispense Refill  . acetaminophen (TYLENOL) 500 MG tablet Take 2 tablets (1,000 mg total) by mouth in the morning and at bedtime. 180 tablet 3  . albuterol (VENTOLIN HFA) 108 (90 Base) MCG/ACT inhaler INHALE 2 PUFFS INTO THE LUNGS EVERY 6 (SIX) HOURS AS NEEDED FOR WHEEZING OR SHORTNESS OF BREATH. 8 g 3  . apixaban (ELIQUIS) 5 MG TABS tablet Take 1 tablet (5 mg total) by mouth 2 (two) times daily. 180 tablet 3  . atorvastatin (LIPITOR) 40 MG tablet Take 1 tablet (40 mg total) by mouth daily at 6 PM. 90 tablet 3  . blood glucose meter kit and supplies KIT Dispense based on patient and insurance preference. Use up to four times daily as directed. (FOR ICD-9 250.00, 250.01). 1 each 0  . buPROPion (WELLBUTRIN SR) 100 MG 12 hr tablet TAKE 1 TABLET (100 MG TOTAL) BY MOUTH 2 (TWO) TIMES DAILY. 60 tablet 6  . carvedilol (COREG) 25 MG tablet TAKE 1 TABLET (25 MG TOTAL) BY MOUTH 2 (TWO) TIMES  DAILY WITH A MEAL. 60 tablet 3  . Continuous Blood Gluc Receiver (FREESTYLE LIBRE 14 DAY READER) DEVI 1 each by Does not apply route as needed. 1 each 11  . cyclobenzaprine (FLEXERIL) 10 MG tablet Take 10 mg by mouth 3 (three) times daily as needed for muscle spasms.    . dapagliflozin propanediol (FARXIGA) 10 MG TABS tablet Take 1 tablet (10 mg total) by mouth daily before breakfast. 30 tablet 11  . ferrous sulfate 325 (65 FE) MG tablet Take 325 mg by mouth in the morning and at bedtime.    . furosemide (LASIX) 80 MG tablet Take 1.5 tablets (120 mg total) by mouth in the morning AND 1 tablet (80 mg total) every evening. 75 tablet 3  . gabapentin (NEURONTIN) 300 MG capsule TAKE 2 CAPSULES (TOTAL = 600 MG), BY MOUTH, 3 TIMES A DAY. 180 capsule 11  . glipiZIDE (GLUCOTROL) 10 MG tablet TAKE 1  TABLET (10 MG TOTAL) BY MOUTH 2 (TWO) TIMES DAILY BEFORE A MEAL. 60 tablet 6  . hydrALAZINE (APRESOLINE) 25 MG tablet TAKE 1.5 TABLETS (37.5 MG TOTAL) BY MOUTH 3 (THREE) TIMES DAILY. 270 tablet 3  . insulin glargine (LANTUS) 100 UNIT/ML injection INJECT 0.2 MLS (20 UNITS TOTAL) INTO THE SKIN DAILY. 30 mL 3  . Insulin Lispro Prot & Lispro (HUMALOG MIX 75/25 KWIKPEN) (75-25) 100 UNIT/ML Kwikpen Inject 30 Units into the skin 2 (two) times daily. 15 mL 11  . Insulin Pen Needle 31G X 6 MM MISC USE AS DIRECTED 100 each 0  . Insulin Syringe-Needle U-100 31G X 5/16" 0.3 ML MISC USE 3 TIMES DAILY TO INJECT INSULIN 300 each 3  . isosorbide mononitrate (IMDUR) 30 MG 24 hr tablet Take 1 tablet (30 mg total) by mouth daily. 30 tablet 3  . metFORMIN (GLUCOPHAGE) 500 MG tablet TAKE 1 TABLET (500 MG TOTAL) BY MOUTH 2 (TWO) TIMES DAILY WITH A MEAL. 60 tablet 6  . Multiple Vitamins-Minerals (CENTRUM SILVER 50+MEN) TABS Take 1 tablet by mouth daily.    . pantoprazole (PROTONIX) 40 MG tablet TAKE 1 TABLET (40 MG TOTAL) BY MOUTH 2 (TWO) TIMES DAILY. 60 tablet 1  . QUEtiapine (SEROQUEL) 300 MG tablet TAKE 1 TABLET BY MOUTH AT BEDTIME.  90 tablet 0  . sacubitril-valsartan (ENTRESTO) 97-103 MG Take 1 tablet by mouth 2 (two) times daily.    [name] Kitchen spironolactone (ALDACTONE) 25 MG tablet Take 1 tablet (25 mg total) by mouth daily. 30 tablet 11  . tetrahydrozoline 0.05 % ophthalmic solution Place 1 drop into both eyes daily.    . TRUEPLUS PEN NEEDLES 31G X 6 MM MISC USE AS DIRECTED 100 each 0  . sildenafil (VIAGRA) 100 MG tablet TAKE 1 TABLET (100 MG TOTAL) BY MOUTH DAILY AS NEEDED FOR ERECTILE DYSFUNCTION. 30 tablet 2   No facility-administered medications prior to visit.    No Known Allergies  ROS Review of Systems    Objective:    Physical Exam Constitutional:      Appearance: He is obese.  HENT:     Head: Normocephalic.  Cardiovascular:     Rate and Rhythm: Normal rate and regular rhythm.     Pulses: Normal pulses.     Heart sounds: Normal heart sounds.  Pulmonary:     Effort: Pulmonary effort is normal.     Breath sounds: Normal breath sounds.  Abdominal:     General: Bowel sounds are normal.     Palpations: Abdomen is soft.  Musculoskeletal:     Cervical back: Normal range of motion.     Right lower leg: Edema present.     Left lower leg: Edema present.  Skin:    General: Skin is warm and dry.     Capillary Refill: Capillary refill takes less than 2 seconds.  Neurological:     General: No focal deficit present.     Mental Status: He is alert and oriented to person, place, and time.  Psychiatric:        Mood and Affect: Mood normal.        Behavior: Behavior normal.        Thought Content: Thought content normal.        Judgment: Judgment normal.     BP 111/66 (BP Location: Left Arm, Patient Position: Sitting, Cuff Size: Large)   Pulse 63   Temp 98.2 F (36.8 C) (Temporal)   Ht '6\' 2"'  (1.88 m)   Wt [name] Kitchen)  336 lb (152.4 kg)   SpO2 98%   BMI 43.14 kg/m  Wt Readings from Last 3 Encounters:  04/01/21 (!) 336 lb (152.4 kg)  03/29/21 (!) 338 lb 3.2 oz (153.4 kg)  03/23/21 (!) 334 lb (151.5 kg)      There are no preventive care reminders to display for this patient.  There are no preventive care reminders to display for this patient.  Lab Results  Component Value Date   TSH 5.320 (H) 07/19/2020   Lab Results  Component Value Date   WBC 5.5 01/26/2021   HGB 13.3 01/26/2021   HCT 39.0 01/26/2021   MCV 88.3 01/26/2021   PLT 264 01/26/2021   Lab Results  Component Value Date   NA 141 03/29/2021   K 3.8 03/29/2021   CO2 27 03/29/2021   GLUCOSE 51 (L) 03/29/2021   BUN 16 03/29/2021   CREATININE 1.25 (H) 03/29/2021   BILITOT 0.9 01/26/2021   ALKPHOS 56 01/26/2021   AST 35 01/26/2021   ALT 22 01/26/2021   PROT 6.2 (L) 01/26/2021   ALBUMIN 2.7 (L) 01/26/2021   CALCIUM 8.3 (L) 03/29/2021   ANIONGAP 8 03/29/2021   EGFR 65 02/23/2021   Lab Results  Component Value Date   CHOL 95 03/29/2021   Lab Results  Component Value Date   HDL 37 (L) 03/29/2021   Lab Results  Component Value Date   LDLCALC 39 03/29/2021   Lab Results  Component Value Date   TRIG 96 03/29/2021   Lab Results  Component Value Date   CHOLHDL 2.6 03/29/2021   Lab Results  Component Value Date   HGBA1C 7.1 (A) 04/01/2021   HGBA1C 7.1 04/01/2021   HGBA1C 7.1 (A) 04/01/2021   HGBA1C 7.1 (A) 04/01/2021      Assessment & Plan:   Problem List Items Addressed This Visit      Endocrine   Type 2 diabetes mellitus with hyperglycemia, with long-term current use of insulin (HCC) - Primary Stable A1c at goal 7.1% encouraged patient to continue on this trail with maintaining glycemic control Encourage compliance with current treatment regimen Encourage regular CBG monitoring Encourage contacting office if excessive hyperglycemia and or hypoglycemia Lifestyle modification with healthy diet (fewer calories, more high fiber foods, whole grains and non-starchy vegetables, lower fat meat and fish, low-fat diary include healthy oils) regular exercise (physical activity) and weight  loss Opthalmology exam discussed  Nutritional consult discusses Regular dental visits encouraged Home BP monitoring also encouraged goal <130/80     Relevant Orders   HgB A1c (Completed)   POCT Urinalysis Dipstick     Other   Paranoid schizophrenia, chronic condition (HCC) Stable on current regimen    Other Visit Diagnoses    Gastroesophageal reflux disease without esophagitis     Stable on current regimne   Dyspnea on exertion     Persistent would like to have this at home when needed   Relevant Orders   For home use only DME Nebulizer machine   Wheezing       Relevant Orders   For home use only DME Nebulizer machine   Reactive airway disease without complication, unspecified asthma severity, unspecified whether persistent       Relevant Orders   For home use only DME Nebulizer machine      Meds ordered this encounter  Medications  . albuterol (PROVENTIL) (2.5 MG/3ML) 0.083% nebulizer solution    Sig: Take 3 mLs (2.5 mg total) by nebulization every 4 (four) hours  as needed for wheezing or shortness of breath.    Dispense:  75 mL    Refill:  2    Order Specific Question:   Supervising Provider    Answer:   Tresa Garter [id number]  . VIAGRA 25 MG tablet    Sig: Take 4 tablets (100 mg total) by mouth daily as needed for erectile dysfunction.    Dispense:  10 tablet    Refill:  0    Order Specific Question:   Supervising Provider    Answer:   Tresa Garter [id number]    Follow-up: Return in about 6 months (around 10/02/2021).    [name], NP

## 2021-04-02 ENCOUNTER — Encounter: Payer: Self-pay | Admitting: Nurse Practitioner

## 2021-04-04 ENCOUNTER — Telehealth: Payer: Self-pay | Admitting: Emergency Medicine

## 2021-04-04 NOTE — Telephone Encounter (Signed)
LMOM to call device clinic with #. [employer] alert for no contact with monitor for 21 days.

## 2021-04-05 ENCOUNTER — Telehealth (HOSPITAL_COMMUNITY): Payer: Self-pay | Admitting: Licensed Clinical Social Worker

## 2021-04-05 NOTE — Telephone Encounter (Signed)
Pt continues to search for housing options but has misplaced list of housing resources that CSW provided him previously.  CSW created new housing list of currently available or upcoming housing options and pt came by and picked up.  Pt continuing to stay at a motel while he looks into housing options.  Will continue to follow and assist as needed  [name], [facility] Desk#: [phone] Cell#: [phone]

## 2021-04-06 NOTE — Telephone Encounter (Signed)
Contacted patient by phone. Patient states that his living arrangement has changed and that he currently does not have his monitor with him. Patient states that he will go get his monitor today and plug it in at his current residence to establish monitor communication.

## 2021-04-07 ENCOUNTER — Other Ambulatory Visit (HOSPITAL_COMMUNITY): Payer: Self-pay

## 2021-04-07 NOTE — Progress Notes (Signed)
Paramedicine Encounter    Patient ID: [name], male    DOB: 10/20/1961, 60 y.o.   MRN: [id number]  Met with [name] today for a medication review. I reviewed all medications and confirmed same filling 2 pill boxes accordingly. [name] had no complaints today and reports feeling okay despite his anxiety about housing. [name] reports he spoke to [facility] office and they confirmed $100 monthly payments until Aug 2022. [name] stated he is talking with his sister [name] and he is going to try to stay with her until August then he can get his own place once his monthly SS checks come in. [name] was encouraged to continue to seek safe housing and he agreed. Visit complete I will see [name] in two weeks.   Refills: Glipizide Arlyce Harman Carvedilol Atorvastatin Pantoprazole    -[name] needs 6/6 HF appointment rescheduled due to a dentist appointment that day.     ACTION: Home visit completed Next visit planned for two weeks

## 2021-04-08 ENCOUNTER — Ambulatory Visit (HOSPITAL_COMMUNITY)
Admission: RE | Admit: 2021-04-08 | Discharge: 2021-04-08 | Disposition: A | Discharge status: Home / Self Care | Payer: Medicare (Managed Care) | Source: Ambulatory Visit | Attending: Cardiology | Admitting: Cardiology

## 2021-04-08 ENCOUNTER — Other Ambulatory Visit: Payer: Self-pay

## 2021-04-08 DIAGNOSIS — I5022 Chronic systolic (congestive) heart failure: Secondary | ICD-10-CM | POA: Diagnosis present

## 2021-04-08 LAB — BASIC METABOLIC PANEL
Anion gap: 6 (ref 5–15)
BUN: 28 mg/dL — ABNORMAL HIGH (ref 6–20)
CO2: 29 mmol/L (ref 22–32)
Calcium: 8.4 mg/dL — ABNORMAL LOW (ref 8.9–10.3)
Chloride: 102 mmol/L (ref 98–111)
Creatinine, Ser: 1.32 mg/dL — ABNORMAL HIGH (ref 0.61–1.24)
GFR, Estimated: >60 mL/min (ref >60)
Glucose, Bld: 100 mg/dL — ABNORMAL HIGH (ref 70–99)
Potassium: 3.7 mmol/L (ref 3.5–5.1)
Sodium: 137 mmol/L (ref 135–145)

## 2021-04-08 LAB — BRAIN NATRIURETIC PEPTIDE: B Natriuretic Peptide: 99.8 pg/mL (ref 0.0–100.0)

## 2021-04-11 ENCOUNTER — Other Ambulatory Visit: Payer: Self-pay | Admitting: Cardiology

## 2021-04-11 ENCOUNTER — Telehealth (HOSPITAL_COMMUNITY): Payer: Self-pay

## 2021-04-11 ENCOUNTER — Other Ambulatory Visit: Payer: Self-pay

## 2021-04-11 NOTE — Telephone Encounter (Signed)
Refills called in and will be picked up Monday 04/18/21. -Glipizide -Spironolactone -Carvedilol -Atorvastatin -Pantoprazole

## 2021-04-13 ENCOUNTER — Inpatient Hospital Stay (HOSPITAL_COMMUNITY)
Admission: EM | Admit: 2021-04-13 | Discharge: 2021-04-19 | DRG: 291 | Disposition: A | Discharge status: Home / Self Care | Payer: Medicare (Managed Care) | Attending: Internal Medicine | Admitting: Internal Medicine

## 2021-04-13 ENCOUNTER — Encounter (HOSPITAL_COMMUNITY): Payer: Self-pay | Admitting: Emergency Medicine

## 2021-04-13 ENCOUNTER — Other Ambulatory Visit: Payer: Self-pay

## 2021-04-13 ENCOUNTER — Emergency Department (HOSPITAL_COMMUNITY): Payer: Medicare (Managed Care)

## 2021-04-13 DIAGNOSIS — N179 Acute kidney failure, unspecified: Secondary | ICD-10-CM | POA: Diagnosis present

## 2021-04-13 DIAGNOSIS — F32A Depression, unspecified: Secondary | ICD-10-CM | POA: Diagnosis present

## 2021-04-13 DIAGNOSIS — T502X5A Adverse effect of carbonic-anhydrase inhibitors, benzothiadiazides and other diuretics, initial encounter: Secondary | ICD-10-CM | POA: Diagnosis present

## 2021-04-13 DIAGNOSIS — Z9114 Patient's other noncompliance with medication regimen: Secondary | ICD-10-CM

## 2021-04-13 DIAGNOSIS — M5442 Lumbago with sciatica, left side: Secondary | ICD-10-CM

## 2021-04-13 DIAGNOSIS — Z5320 Procedure and treatment not carried out because of patient's decision for unspecified reasons: Secondary | ICD-10-CM | POA: Diagnosis present

## 2021-04-13 DIAGNOSIS — K31811 Angiodysplasia of stomach and duodenum with bleeding: Secondary | ICD-10-CM | POA: Diagnosis present

## 2021-04-13 DIAGNOSIS — I248 Other forms of acute ischemic heart disease: Secondary | ICD-10-CM | POA: Diagnosis present

## 2021-04-13 DIAGNOSIS — I130 Hypertensive heart and chronic kidney disease with heart failure and stage 1 through stage 4 chronic kidney disease, or unspecified chronic kidney disease: Secondary | ICD-10-CM | POA: Diagnosis not present

## 2021-04-13 DIAGNOSIS — G4733 Obstructive sleep apnea (adult) (pediatric): Secondary | ICD-10-CM | POA: Diagnosis present

## 2021-04-13 DIAGNOSIS — K648 Other hemorrhoids: Secondary | ICD-10-CM | POA: Diagnosis present

## 2021-04-13 DIAGNOSIS — Z8249 Family history of ischemic heart disease and other diseases of the circulatory system: Secondary | ICD-10-CM

## 2021-04-13 DIAGNOSIS — N1831 Chronic kidney disease, stage 3a: Secondary | ICD-10-CM | POA: Diagnosis present

## 2021-04-13 DIAGNOSIS — I255 Ischemic cardiomyopathy: Secondary | ICD-10-CM | POA: Diagnosis present

## 2021-04-13 DIAGNOSIS — Z599 Problem related to housing and economic circumstances, unspecified: Secondary | ICD-10-CM

## 2021-04-13 DIAGNOSIS — F419 Anxiety disorder, unspecified: Secondary | ICD-10-CM | POA: Diagnosis present

## 2021-04-13 DIAGNOSIS — Z955 Presence of coronary angioplasty implant and graft: Secondary | ICD-10-CM

## 2021-04-13 DIAGNOSIS — N529 Male erectile dysfunction, unspecified: Secondary | ICD-10-CM | POA: Diagnosis present

## 2021-04-13 DIAGNOSIS — Z818 Family history of other mental and behavioral disorders: Secondary | ICD-10-CM

## 2021-04-13 DIAGNOSIS — Z79899 Other long term (current) drug therapy: Secondary | ICD-10-CM

## 2021-04-13 DIAGNOSIS — D122 Benign neoplasm of ascending colon: Secondary | ICD-10-CM

## 2021-04-13 DIAGNOSIS — Z635 Disruption of family by separation and divorce: Secondary | ICD-10-CM

## 2021-04-13 DIAGNOSIS — R0602 Shortness of breath: Secondary | ICD-10-CM | POA: Diagnosis not present

## 2021-04-13 DIAGNOSIS — Z95810 Presence of automatic (implantable) cardiac defibrillator: Secondary | ICD-10-CM

## 2021-04-13 DIAGNOSIS — Z7984 Long term (current) use of oral hypoglycemic drugs: Secondary | ICD-10-CM

## 2021-04-13 DIAGNOSIS — E1122 Type 2 diabetes mellitus with diabetic chronic kidney disease: Secondary | ICD-10-CM | POA: Diagnosis present

## 2021-04-13 DIAGNOSIS — I509 Heart failure, unspecified: Secondary | ICD-10-CM

## 2021-04-13 DIAGNOSIS — K289 Gastrojejunal ulcer, unspecified as acute or chronic, without hemorrhage or perforation: Secondary | ICD-10-CM | POA: Diagnosis present

## 2021-04-13 DIAGNOSIS — D500 Iron deficiency anemia secondary to blood loss (chronic): Secondary | ICD-10-CM | POA: Diagnosis present

## 2021-04-13 DIAGNOSIS — Z833 Family history of diabetes mellitus: Secondary | ICD-10-CM

## 2021-04-13 DIAGNOSIS — Z7901 Long term (current) use of anticoagulants: Secondary | ICD-10-CM

## 2021-04-13 DIAGNOSIS — I480 Paroxysmal atrial fibrillation: Secondary | ICD-10-CM | POA: Diagnosis present

## 2021-04-13 DIAGNOSIS — D123 Benign neoplasm of transverse colon: Secondary | ICD-10-CM | POA: Diagnosis present

## 2021-04-13 DIAGNOSIS — E1165 Type 2 diabetes mellitus with hyperglycemia: Secondary | ICD-10-CM

## 2021-04-13 DIAGNOSIS — Z6841 Body Mass Index (BMI) 40.0 and over, adult: Secondary | ICD-10-CM

## 2021-04-13 DIAGNOSIS — I2721 Secondary pulmonary arterial hypertension: Secondary | ICD-10-CM | POA: Diagnosis present

## 2021-04-13 DIAGNOSIS — Z20822 Contact with and (suspected) exposure to covid-19: Secondary | ICD-10-CM | POA: Diagnosis present

## 2021-04-13 DIAGNOSIS — K5730 Diverticulosis of large intestine without perforation or abscess without bleeding: Secondary | ICD-10-CM | POA: Diagnosis present

## 2021-04-13 DIAGNOSIS — F209 Schizophrenia, unspecified: Secondary | ICD-10-CM | POA: Diagnosis present

## 2021-04-13 DIAGNOSIS — I5023 Acute on chronic systolic (congestive) heart failure: Secondary | ICD-10-CM | POA: Diagnosis present

## 2021-04-13 DIAGNOSIS — I2510 Atherosclerotic heart disease of native coronary artery without angina pectoris: Secondary | ICD-10-CM | POA: Diagnosis present

## 2021-04-13 DIAGNOSIS — K922 Gastrointestinal hemorrhage, unspecified: Secondary | ICD-10-CM | POA: Diagnosis present

## 2021-04-13 DIAGNOSIS — D124 Benign neoplasm of descending colon: Secondary | ICD-10-CM | POA: Diagnosis present

## 2021-04-13 DIAGNOSIS — Z87891 Personal history of nicotine dependence: Secondary | ICD-10-CM

## 2021-04-13 DIAGNOSIS — K621 Rectal polyp: Secondary | ICD-10-CM | POA: Diagnosis present

## 2021-04-13 DIAGNOSIS — G8929 Other chronic pain: Secondary | ICD-10-CM

## 2021-04-13 DIAGNOSIS — Z794 Long term (current) use of insulin: Secondary | ICD-10-CM

## 2021-04-13 LAB — COMPREHENSIVE METABOLIC PANEL
ALT: 36 U/L (ref 0–44)
AST: 36 U/L (ref 15–41)
Albumin: 2.9 g/dL — ABNORMAL LOW (ref 3.5–5.0)
Alkaline Phosphatase: 51 U/L (ref 38–126)
Anion gap: 6 (ref 5–15)
BUN: 27 mg/dL — ABNORMAL HIGH (ref 6–20)
CO2: 30 mmol/L (ref 22–32)
Calcium: 8.1 mg/dL — ABNORMAL LOW (ref 8.9–10.3)
Chloride: 105 mmol/L (ref 98–111)
Creatinine, Ser: 1.45 mg/dL — ABNORMAL HIGH (ref 0.61–1.24)
GFR, Estimated: 55 mL/min — ABNORMAL LOW (ref >60)
Glucose, Bld: 75 mg/dL (ref 70–99)
Potassium: 3.6 mmol/L (ref 3.5–5.1)
Sodium: 141 mmol/L (ref 135–145)
Total Bilirubin: 0.7 mg/dL (ref 0.3–1.2)
Total Protein: 6.4 g/dL — ABNORMAL LOW (ref 6.5–8.1)

## 2021-04-13 LAB — BASIC METABOLIC PANEL
Anion gap: 7 (ref 5–15)
BUN: 28 mg/dL — ABNORMAL HIGH (ref 6–20)
CO2: 28 mmol/L (ref 22–32)
Calcium: 8.4 mg/dL — ABNORMAL LOW (ref 8.9–10.3)
Chloride: 104 mmol/L (ref 98–111)
Creatinine, Ser: 1.46 mg/dL — ABNORMAL HIGH (ref 0.61–1.24)
GFR, Estimated: 55 mL/min — ABNORMAL LOW (ref >60)
Glucose, Bld: 72 mg/dL (ref 70–99)
Potassium: 4.1 mmol/L (ref 3.5–5.1)
Sodium: 139 mmol/L (ref 135–145)

## 2021-04-13 LAB — CBC WITH DIFFERENTIAL/PLATELET
Abs Immature Granulocytes: 0.01 10*3/uL (ref 0.00–0.07)
Basophils Absolute: 0 10*3/uL (ref 0.0–0.1)
Basophils Relative: 1 %
Eosinophils Absolute: 0.1 10*3/uL (ref 0.0–0.5)
Eosinophils Relative: 3 %
HCT: 22.5 % — ABNORMAL LOW (ref 39.0–52.0)
Hemoglobin: 6.9 g/dL — CL (ref 13.0–17.0)
Immature Granulocytes: 0 %
Lymphocytes Relative: 19 %
Lymphs Abs: 1.1 10*3/uL (ref 0.7–4.0)
MCH: 28.2 pg (ref 26.0–34.0)
MCHC: 30.7 g/dL (ref 30.0–36.0)
MCV: 91.8 fL (ref 80.0–100.0)
Monocytes Absolute: 0.7 10*3/uL (ref 0.1–1.0)
Monocytes Relative: 13 %
Neutro Abs: 3.5 10*3/uL (ref 1.7–7.7)
Neutrophils Relative %: 64 %
Platelets: 231 10*3/uL (ref 150–400)
RBC: 2.45 MIL/uL — ABNORMAL LOW (ref 4.22–5.81)
RDW: 14.8 % (ref 11.5–15.5)
WBC: 5.4 10*3/uL (ref 4.0–10.5)
nRBC: 0.4 % — ABNORMAL HIGH (ref 0.0–0.2)

## 2021-04-13 LAB — MAGNESIUM: Magnesium: 2.3 mg/dL (ref 1.7–2.4)

## 2021-04-13 LAB — TROPONIN I (HIGH SENSITIVITY)
Troponin I (High Sensitivity): 198 ng/L (ref <18)
Troponin I (High Sensitivity): 200 ng/L (ref <18)

## 2021-04-13 LAB — BRAIN NATRIURETIC PEPTIDE: B Natriuretic Peptide: 404.4 pg/mL — ABNORMAL HIGH (ref 0.0–100.0)

## 2021-04-13 LAB — RESP PANEL BY RT-PCR (FLU A&B, COVID) ARPGX2
Influenza A by PCR: NEGATIVE
Influenza B by PCR: NEGATIVE
SARS Coronavirus 2 by RT PCR: NEGATIVE

## 2021-04-13 LAB — HIV ANTIBODY (ROUTINE TESTING W REFLEX): HIV Screen 4th Generation wRfx: NONREACTIVE

## 2021-04-13 LAB — GLUCOSE, CAPILLARY: Glucose-Capillary: 124 mg/dL — ABNORMAL HIGH (ref 70–99)

## 2021-04-13 LAB — POC OCCULT BLOOD, ED: Fecal Occult Bld: POSITIVE — AB

## 2021-04-13 LAB — PREPARE RBC (CROSSMATCH)

## 2021-04-13 MED ORDER — CARVEDILOL 25 MG PO TABS
25.0000 mg | ORAL_TABLET | Freq: Two times a day (BID) | ORAL | Status: DC
  Administered 2021-04-14 – 2021-04-17 (×7): 25 mg via ORAL
  Filled 2021-04-13 (×8): qty 1

## 2021-04-13 MED ORDER — SODIUM CHLORIDE 0.9 % IV SOLN
10.0000 mL/h | Freq: Once | INTRAVENOUS | Status: AC
  Administered 2021-04-13: 10 mL/h via INTRAVENOUS

## 2021-04-13 MED ORDER — FUROSEMIDE 10 MG/ML IJ SOLN
40.0000 mg | Freq: Once | INTRAMUSCULAR | Status: AC
  Administered 2021-04-13: 40 mg via INTRAVENOUS
  Filled 2021-04-13: qty 4

## 2021-04-13 MED ORDER — INSULIN ASPART 100 UNIT/ML IJ SOLN
0.0000 [IU] | INTRAMUSCULAR | Status: DC
  Administered 2021-04-14: 7 [IU] via SUBCUTANEOUS
  Administered 2021-04-14 (×3): 3 [IU] via SUBCUTANEOUS
  Administered 2021-04-14: 4 [IU] via SUBCUTANEOUS
  Administered 2021-04-15 (×2): 3 [IU] via SUBCUTANEOUS
  Administered 2021-04-15 – 2021-04-16 (×5): 4 [IU] via SUBCUTANEOUS
  Administered 2021-04-17: 2 [IU] via SUBCUTANEOUS
  Administered 2021-04-17 (×3): 3 [IU] via SUBCUTANEOUS
  Administered 2021-04-17 – 2021-04-18 (×2): 4 [IU] via SUBCUTANEOUS
  Administered 2021-04-18: 3 [IU] via SUBCUTANEOUS
  Administered 2021-04-18 (×2): 4 [IU] via SUBCUTANEOUS
  Administered 2021-04-18: 3 [IU] via SUBCUTANEOUS
  Administered 2021-04-18: 4 [IU] via SUBCUTANEOUS
  Administered 2021-04-19 (×2): 3 [IU] via SUBCUTANEOUS

## 2021-04-13 MED ORDER — ACETAMINOPHEN 500 MG PO TABS: 1000.0000 mg | ORAL_TABLET | Freq: Three times a day (TID) | ORAL | Status: DC | PRN

## 2021-04-13 MED ORDER — CYCLOBENZAPRINE HCL 10 MG PO TABS: 10.0000 mg | ORAL_TABLET | Freq: Three times a day (TID) | ORAL | Status: DC | PRN

## 2021-04-13 MED ORDER — ALBUTEROL SULFATE HFA 108 (90 BASE) MCG/ACT IN AERS: 2.0000 | INHALATION_SPRAY | Freq: Four times a day (QID) | RESPIRATORY_TRACT | Status: DC | PRN

## 2021-04-13 MED ORDER — FUROSEMIDE 10 MG/ML IJ SOLN
80.0000 mg | Freq: Once | INTRAMUSCULAR | Status: AC
  Administered 2021-04-13: 80 mg via INTRAVENOUS
  Filled 2021-04-13: qty 8

## 2021-04-13 MED ORDER — INSULIN GLARGINE 100 UNIT/ML ~~LOC~~ SOLN
20.0000 [IU] | Freq: Every day | SUBCUTANEOUS | Status: DC
  Administered 2021-04-14 – 2021-04-19 (×5): 20 [IU] via SUBCUTANEOUS
  Filled 2021-04-13 (×6): qty 0.2

## 2021-04-13 MED ORDER — ALBUTEROL SULFATE (2.5 MG/3ML) 0.083% IN NEBU: 2.5000 mg | INHALATION_SOLUTION | RESPIRATORY_TRACT | Status: DC | PRN

## 2021-04-13 MED ORDER — QUETIAPINE FUMARATE 50 MG PO TABS
300.0000 mg | ORAL_TABLET | Freq: Every day | ORAL | Status: DC
  Administered 2021-04-13 – 2021-04-18 (×6): 300 mg via ORAL
  Filled 2021-04-13 (×6): qty 6

## 2021-04-13 MED ORDER — PANTOPRAZOLE SODIUM 40 MG PO TBEC
40.0000 mg | DELAYED_RELEASE_TABLET | Freq: Two times a day (BID) | ORAL | Status: DC
  Administered 2021-04-13 – 2021-04-15 (×4): 40 mg via ORAL
  Filled 2021-04-13 (×4): qty 1

## 2021-04-13 MED ORDER — ATORVASTATIN CALCIUM 40 MG PO TABS
40.0000 mg | ORAL_TABLET | Freq: Every day | ORAL | Status: DC
  Administered 2021-04-14 – 2021-04-18 (×5): 40 mg via ORAL
  Filled 2021-04-13 (×5): qty 1

## 2021-04-13 MED ORDER — DAPAGLIFLOZIN PROPANEDIOL 10 MG PO TABS
10.0000 mg | ORAL_TABLET | Freq: Every day | ORAL | Status: DC
  Administered 2021-04-14 – 2021-04-19 (×5): 10 mg via ORAL
  Filled 2021-04-13 (×6): qty 1

## 2021-04-13 MED ORDER — LACTATED RINGERS IV BOLUS
500.0000 mL | Freq: Once | INTRAVENOUS | Status: AC
  Administered 2021-04-13: 500 mL via INTRAVENOUS

## 2021-04-13 NOTE — Hospital Course (Addendum)
Discharge Summary:   #Acute on chronic HFrEF (EF 30%) exacerbation  Patient received IV lasix 40 mg in ED on day of admission (5/18) and then another 80 mg, with adequate urine output. Continued diuresis with IV lasix 80 mg twice daily and monitored urine output. On admission weight was 343 lbs and dry weight seems to be around 321 lbs. On discharge weight was *** BMP trended and creatinine on admission was 1.46 and downtrended with diuresis. On discharge Cr ***. Farxiga 10 mg daily and Coreg 25 mg twice daily. BP soft in the morning of 5/23, Coreg stopped but started again at 6.25 mg BID.  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 Two weeks of bloody stools, most likely due to GI bleed. Last dose Eliquis was AM of 5/18 and hemoglobin was found to 6.9 on admission. GI consulted. 2 units pRBCs transfused and hemoglobin up to 8.3. Iron studies were checked: iron of 25 and ferritin of 11. On 5/20 received Ferraheme transfusion given. Enteroscopy performed on 5/20 which showed 2 very small angioectasias with no bleeding in the third portion of the duodenum. GI recommended colonoscopy and scheduled for 5/21, which showed multiple polyps which were biopsied. No source of bleeding found. Underwent pill endoscopy 5/22 which showed non bleeding AVM. Pt undergone repeat  enteroscopy which showed ***.   #Ischemic Cardiomyopathy with ICD placement  #Elevated troponin (resolved)  EKG normal sinus rhythm, T wave inversions in leads I, III, and avR. Consistent with prior EKG in March 2022. Trops were flat (198 >200) low concern for ACS. Continued atorvastatin 40 mg daily along with cardiac monitoring. Held blood pressure medications given GI bleed.   #Diabetes Mellitus  Continued Farxiga 10 mg daily. Held metformin and glipizide during admission. Inpatient regimen was Lantus 20 units daily  and resistant SSI for correctional insulin.     #Paroxysmal atrial fibrillation  Eliquis held. Continued Coreg 25 mg twice daily. BP soft in the morning of 5/23, Coreg stopped but started again at 6.25 mg BID. Heart rate intermittently irregular. Decision to continue anticoagulation *** Eliquis restarted at ***.    #AKI  Creatinine 1.46 on admission and trended down to ** on discharge. Baseline seems to fluctuate between 1.1-1.2.  -Continue to trend BMP    #Obstructive Sleep Apnea  Patient states he sometimes he uses CPAP machine at night.  Recommended to use CPAP nightly   #Hypertension  Home blood pressure medications include hydralazine 25 mg (1.5 tablets) three times daily, Entresto 97-103 twice daily, spironolactone 25 mg daily, and Coreg 25 mg twice daily.  Continude Coreg 25 mg twice daily initially which was reduced to 6.25 mg BID later due to low BP. Held all other BP meds due to GI bleed, higher risk for hypotension from blood loss.

## 2021-04-13 NOTE — H&P (Addendum)
Date: 04/13/2021               Patient Name:  [name] MRN: [id number]  DOB: Mar 25, 1961 Age / Sex: 60 y.o., male   PCP: Azzie Glatter, FNP (Inactive)              Medical Service: Internal Medicine Teaching Service              Attending Physician: Dr. Jimmye Norman, Elaina Pattee, MD    First Contact: Jacqlyn Larsen, MS4 Pager: [phone]  Second Contact: Dr. Maudie Mercury Pager: [phone]       After Hours (After 5p/  First Contact Pager: [phone]  weekends / holidays): Second Contact Pager: [phone]   Chief Complaint: shortness of breath, chest pain, and dizziness   History of Present Illness: [name] is a 60 y.o. 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 Of note, patient also reports bloody stools. He started noticing swelling about two weeks ago, predominantly in the stomach. He also noted swelling in his legs. He noticed at the gym several days ago he was only able to walk several laps before becoming very tired. He states that he felt very tired and mostly wants to sleep. No orthopnea or palpitations. He also has some light, non-radiating central chest pain that started last week after picking up a 3 tonne jack hammer. He does take Lasix twice daily (80 mg) and states he may have missed one or two doses in the last few weeks. Otherwise he takes his medications consistently. Has been in a hotel for the past 2 weeks, and has been eating fast food because "It's everywhere around me."   Additionally, reports tarry black/red stools that started about two weeks ago. Reports the blood is mixed in with his stool and originally thought this was because he was eating too many greens. Started feeling dizzy and feeling "out" during the day. He fell about two weeks ago. He did not lose  consciousness and did not hit his head. He states it took him some time to get back up because he is heavier, but he was able to get back up by himself. Reports some constipation recently.   He does have sleep apnea and has a CPAP machine at home that he uses sometimes. He states he needs to get new filters for it. He states he has had diabetes since he was 60 years old and takes all of his medications. Denies fevers, chills, urinary symptoms, or diarrhea.   ED Course: Upon presentation to the ED, afebrile at 98.7, BP 117/67, pulse 59, Sp)2 96%, and RR 17. EKG normal sinus rhythm, T wave inversions in leads I, III, and avR. Consistent with prior EKG in March 2022. CXR showed cardiomegaly and pulmonary vascular congestion. Hazy bilateral perihilar and bibasilar opacities. Na 141, K 3.6, Cr 1.46, BUN 28. Trop 198 >200. BNP 404.4. WBC 5.4 and hemoglobin 6.9 (down from 12.2 on 01/26/2021). Ordered 2 units pRBCs in ED. Received LR 500 ml bolus and IV Lasix 40 mg. Remained hemodynamically stable.   Meds:  Current Meds  Medication Sig   acetaminophen (TYLENOL) 500 MG tablet Take 2 tablets (1,000 mg total) by mouth in the morning and at bedtime.   albuterol (VENTOLIN HFA) 108 (90 Base) MCG/ACT inhaler INHALE 2 PUFFS INTO  THE LUNGS EVERY 6 (SIX) HOURS AS NEEDED FOR WHEEZING OR SHORTNESS OF BREATH.   apixaban (ELIQUIS) 5 MG TABS tablet Take 1 tablet (5 mg total) by mouth 2 (two) times daily.   atorvastatin (LIPITOR) 40 MG tablet Take 1 tablet (40 mg total) by mouth daily at 6 PM.   buPROPion (WELLBUTRIN SR) 100 MG 12 hr tablet TAKE 1 TABLET (100 MG TOTAL) BY MOUTH 2 (TWO) TIMES DAILY. (Patient taking differently: Take 100 mg by mouth 2 (two) times daily.)   carvedilol (COREG) 25 MG tablet TAKE 1 TABLET (25 MG TOTAL) BY MOUTH 2 (TWO) TIMES DAILY WITH A MEAL. (Patient taking differently: Take 25 mg by mouth 2 (two) times daily with a meal.)   cyclobenzaprine (FLEXERIL) 10 MG tablet Take 10 mg by mouth 3 (three)  times daily as needed for muscle spasms.   dapagliflozin propanediol (FARXIGA) 10 MG TABS tablet Take 1 tablet (10 mg total) by mouth daily before breakfast.   ferrous sulfate 325 (65 FE) MG tablet Take 325 mg by mouth in the morning and at bedtime.   furosemide (LASIX) 80 MG tablet Take 1.5 tablets (120 mg total) by mouth in the morning AND 1 tablet (80 mg total) every evening.   gabapentin (NEURONTIN) 300 MG capsule TAKE 2 CAPSULES (TOTAL = 600 MG), BY MOUTH, 3 TIMES A DAY. (Patient taking differently: Take 600 mg by mouth 3 (three) times daily.)   glipiZIDE (GLUCOTROL) 10 MG tablet TAKE 1 TABLET (10 MG TOTAL) BY MOUTH 2 (TWO) TIMES DAILY BEFORE A MEAL. (Patient taking differently: Take 10 mg by mouth 2 (two) times daily.)   hydrALAZINE (APRESOLINE) 25 MG tablet TAKE 1.5 TABLETS (37.5 MG TOTAL) BY MOUTH 3 (THREE) TIMES DAILY. (Patient taking differently: Take 37.5 mg by mouth 3 (three) times daily.)   insulin glargine (LANTUS) 100 UNIT/ML injection INJECT 0.2 MLS (20 UNITS TOTAL) INTO THE SKIN DAILY. (Patient taking differently: Inject 20 Units into the skin daily.)   Insulin Lispro Prot & Lispro (HUMALOG MIX 75/25 KWIKPEN) (75-25) 100 UNIT/ML Kwikpen Inject 30 Units into the skin 2 (two) times daily.   isosorbide mononitrate (IMDUR) 30 MG 24 hr tablet Take 1 tablet (30 mg total) by mouth daily.   metFORMIN (GLUCOPHAGE) 500 MG tablet TAKE 1 TABLET (500 MG TOTAL) BY MOUTH 2 (TWO) TIMES DAILY WITH A MEAL. (Patient taking differently: Take 500 mg by mouth 2 (two) times daily with a meal.)   Multiple Vitamins-Minerals (CENTRUM SILVER 50+MEN) TABS Take 1 tablet by mouth daily.   pantoprazole (PROTONIX) 40 MG tablet TAKE 1 TABLET (40 MG TOTAL) BY MOUTH 2 (TWO) TIMES DAILY. (Patient taking differently: Take 40 mg by mouth 2 (two) times daily.)   QUEtiapine (SEROQUEL) 300 MG tablet TAKE 1 TABLET BY MOUTH AT BEDTIME. (Patient taking differently: Take 300 mg by mouth at bedtime.)   sacubitril-valsartan  (ENTRESTO) 97-103 MG Take 1 tablet by mouth 2 (two) times daily.   spironolactone (ALDACTONE) 25 MG tablet Take 1 tablet (25 mg total) by mouth daily.   tetrahydrozoline 0.05 % ophthalmic solution Place 1 drop into both eyes daily.   VIAGRA 25 MG tablet Take 4 tablets (100 mg total) by mouth daily as needed for erectile dysfunction.   Allergies: Allergies as of 04/13/2021   (No Known Allergies)   Past Medical History:  Diagnosis Date   Anxiety    CAD (coronary artery disease)    CHF (congestive heart failure) ([place]) 09/2019   Depression    Diabetes mellitus  Erectile dysfunction 11/2019   H/O right heart catheterization 09/2019   Hypertension    Schizophrenia ([place])    Sleep apnea    uses cpap   Family History:  Mother has a history of heart problems, brother has a history of cancer (unsure, does smoke cigarettes), three out of seven of his siblings have diabetes.   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 He is currently on disability. Lives in [place] with his sister. He previous used tobacco and quit 20 years ago. Had a 10 pack year previously. Drinks about 1-2 beers on during the week, mostly during the weekend. No other drug use.   Review of Systems: A complete ROS was negative except as per HPI.   Physical Exam: Blood pressure (!) 142/69, pulse 63, temperature 98.7 F (37.1 C), temperature source Oral, resp. rate 17, SpO2 95 %.  Physical Exam Constitutional:      Appearance: Normal appearance.  Cardiovascular:     Rate and Rhythm: Normal rate and regular rhythm.     Pulses: Normal pulses.     Heart sounds: Normal heart sounds.     Comments: Heart sounds difficult to appreciate at lower left sternal border and apical area.  JVP difficult to appreciate. Pulmonary:     Effort: Pulmonary effort is normal.     Comments: End-expiratory wheezes present in all lung fields bilaterally. Faint  crackles heard in bibasilar fields bilaterally.  Abdominal:     General: Bowel sounds are normal. There is distension.     Palpations: Abdomen is soft.     Comments: Abdomen dull to percussion in all four quadrants.  Musculoskeletal:     Comments: 2+ pitting edema of lower extremities bilaterally up to the knees.   Skin:    General: Skin is warm and dry.  Neurological:     General: No focal deficit present.     Mental Status: He is alert and oriented to person, place, and time.  Psychiatric:        Mood and Affect: Mood normal.        Behavior: Behavior normal.    EKG: Normal sinus rhythm, T wave inversions in leads I, III, and avR. Consistent with prior EKG in March 2022.   CXR: Cardiomegaly and pulmonary vascular congestion. Hazy bilateral perihilar and bibasilar opacities may represent mild edema. Infection is a less likely differential consideration.  Assessment & Plan by Problem: Active Problems:   CHF exacerbation (HCC)   GI bleeding  #Acute on chronic HFrEF (EF 30%) exacerbation  Patient reports 25 lb weight gain, along with increased shortness of breath, light chest pain, and fatigue for the past two weeks in the setting of eating more fast food and salty foods. Patient's history along with physical exam, CXR, and lab findings of elevated BNP suggest acute heart failure exacerbation. Patient seems to be adherent to Lasix at home. Less likely due to ischemia as trops are flat and EKG with no new abnormalities. Patient has history of atrial fibrillation, but is in normal rate and rhythm.   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 Two weeks of bloody stools, most likely due to GI bleed. Currently on Eliquis and hemoglobin was found to 6.9 on admission. GI consulted.   -2 units pRBCs  -Trend CBC -GI consulted, appreciate evaluation  -NPO at midnight in case of procedure tomorrow  -Hold Eliquis and home blood pressure medications   #Ischemic Cardiomyopathy with ICD placement  #Elevated troponin (resolved)  Patient reports "light" central, non-radiating chest pain for the past two weeks after lifting a heavy jack hammer. Given that his EKG was unremarkable and trops were flat (198 >200) low concern for ACS at this time. Elevated troponin most likely due to demand ischemia. Could be costochondritis/MSK pain due to heavy lifting. Most recent ICD device interrogation on 03/08/2021. According to chart review, no recent ICD shocks.   -Continue Atorvastatin 40 mg daily  -Hold blood pressure medications given GI bleed  -Continue to monitor symptoms and physical exam  -Cardiac monitoring   #Diabetes Mellitus  Last measured A1c 7.1% 03/2021. Home regimen includes metformin 500 mg twice daily, Farxiga 10 mg daily, glipizide 10 mg twice daily, Lantus 20 units daily, and Humalog 30 units twice daily.   -Continue Farxiga 10 mg daily  -Hold metformin and glipizide -Lantus 20 units daily  -resistant SSI for correctional insulin   #Paroxysmal atrial fibrillation  Most recent burden of atrial fibrillation of 2.1% and anticoagulated on Eliquis 5 mg twice daily.   - Hold Eliquis given GI bleed - Continue Coreg 25 mg twice daily   #CKD Stage IIIa #AKI  Creatinine 1.46, which is up from 1.32 (04/08/2021). Baseline seems to fluctuate between 1.1-1.2.   -Continue to trend BMP   #Obstructive Sleep Apnea  Patient states he sometimes he uses CPAP machine at night. Will order CPAP for him here in the hospital.   -CPAP nightly  #Hypertension  Home blood pressure medications include hydralazine 25 mg  (1.5 tablets) three times daily, Entresto 97-103 twice daily, spironolactone 25 mg daily, and Coreg 25 mg twice daily.   -Continue Coreg 25 mg twice daily  -Hold all other BP meds due to GI bleed, higher risk for hypotension from blood loss Dispo: Admit patient to  Internal Medicine   Signed: Jacqlyn Larsen, Medical Student 04/13/2021, 5:53 PM  Pager: [phone] After 5pm on weekdays and 1pm on weekends: On Call pager: [phone]   Attestation for Student Documentation:  I personally was present and performed or re-performed the history, physical exam and medical decision-making activities of this service and have verified that the service and findings are accurately documented in the student's note.  Maudie Mercury, MD 04/13/2021, 7:49 PM

## 2021-04-13 NOTE — ED Triage Notes (Signed)
Pt here from home with c/o not feeling well and dizziness along with chest pain and sob

## 2021-04-13 NOTE — ED Provider Notes (Addendum)
Emergency Medicine Provider Triage Evaluation Note  KORVER GRAYBEAL , a 60 y.o. male  was evaluated in triage.  Pt complains of SHOB, dizziness, hx heart failure (pacer defib, unknown manufacture). Symptoms started 1 week ago, brought in by concerned sister. 21 lb weight gain over the past week.   Review of Systems  Positive: Dizziness, SHOB, CP ("small") Negative: fever  Physical Exam  BP (!) 101/56 (BP Location: Right Arm)   Pulse (!) 48   Temp 98.7 F (37.1 C) (Oral)   Resp 17   SpO2 96%  Gen:   Awake, no distress   Resp:  Normal effort  MSK:   Moves extremities without difficulty  Other:    Medical Decision Making  Medically screening exam initiated at 12:50 PM.  Appropriate orders placed.  TAGG EUSTICE was informed that the remainder of the evaluation will be completed by another provider, this initial triage assessment does not replace that evaluation, and the importance of remaining in the ED until their evaluation is complete.     Tacy Learn, PA-C 04/13/21 1251    Tacy Learn, PA-C 04/13/21 1253    Quintella Reichert, MD 04/14/21 [phone]

## 2021-04-13 NOTE — ED Provider Notes (Signed)
[facility] EMERGENCY DEPARTMENT Provider Note   CSN: [id number] Arrival date & time: 04/13/21  1235     History No chief complaint on file.   [name] is a 60 y.o. male with ischemic cardiomyopathy with LVEF of 30% who presents with concern for 25 pound weight gain in the last week, and shortness of breath and fatigue.  Additionally endorses "small" left-sided chest pain intermittent x2 weeks.  Patient is on Lasix at home as well as spironolactone and endorses compliance with his medications.  He is not on any oxygen at home.  Additionally does endorse significant worsening bilateral lower extremity swelling last week.  Personally reviewed this patient's medical records.  Patient with implanted ICD, ischemic cardiomyopathy, LVEF of 30% on echo on 04/05/2020.  Anticoagulated on Eliquis.   Of note patient with history of gastric and jejunal AVMs that have required clips during enteroscopy in 03/2020.  History of blood transfusion secondary to chronic blood loss anemia from GI bleed.  EGD/colonoscopy in 2017 at [facility] that did not reveal source of blood loss.   Patient does endorse dark tarry stools x1 week, history of exam in the past that required blood transfusion.  Was seen at [facility] at that time, has not followed with gastroenterology.  He is anticoagulated on Eliquis.  HPI     Past Medical History:  Diagnosis Date  . Anxiety   . CAD (coronary artery disease)   . CHF (congestive heart failure) ([place]) 09/2019  . Depression   . Diabetes mellitus   . Erectile dysfunction 11/2019  . H/O right heart catheterization 09/2019  . Hypertension   . Schizophrenia ([place])   . Sleep apnea    uses cpap    Patient Active Problem List   Diagnosis Date Noted  . 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 Acute blood loss anemia   . Angiodysplasia of stomach   . Chronic anticoagulation   . CHF exacerbation ([place]) 06/15/2020  . AKI  (acute kidney injury) ([place]) 06/15/2020  . CKD (chronic kidney disease), stage III ([place]) 06/15/2020  . Anemia due to chronic blood loss   . Gastric AVM   . Angiodysplasia of small intestine ([place])   . Acute on chronic systolic (congestive) heart failure ([place]) 04/05/2020  . Anemia 04/05/2020  . PAF (paroxysmal atrial fibrillation) ([place]) 04/05/2020  . OSA on CPAP 04/05/2020  . Syncope and collapse 04/05/2020  . Type 2 diabetes mellitus with hyperglycemia, with long-term current use of insulin ([place]) 12/03/2019  . Diabetic polyneuropathy associated with type 2 diabetes mellitus ([place]) 12/03/2019  . Hyperglycemia 12/03/2019  . History of hyperglycemia 12/03/2019  . Erectile dysfunction 12/03/2019  . CHF (congestive heart failure) ([place]) 10/11/2019  . Paranoid schizophrenia, chronic condition ([place]) 01/26/2015  . Severe recurrent major depressive disorder with psychotic features ([place]) 01/26/2015  . GAD (generalized anxiety disorder) 01/26/2015  . OCD (obsessive compulsive disorder) 01/26/2015  . Panic disorder without agoraphobia 01/26/2015  . Insomnia 01/26/2015    Past Surgical History:  Procedure Laterality Date  . CORONARY STENT PLACEMENT    . ENTEROSCOPY N/A 04/08/2020   Procedure: ENTEROSCOPY;  Surgeon: Doran Stabler, MD;  Location: [facility] ENDOSCOPY;  Service: Gastroenterology;  Laterality: N/A;  . ENTEROSCOPY N/A 06/17/2020   Procedure: ENTEROSCOPY;  Surgeon: Irene Shipper, MD;  Location: [facility] ENDOSCOPY;  Service: Endoscopy;  Laterality: N/A;  . HEMOSTASIS CLIP PLACEMENT  04/08/2020   Procedure: HEMOSTASIS CLIP PLACEMENT;  Surgeon: Doran Stabler, MD;  Location: [facility] ENDOSCOPY;  Service: Gastroenterology;;  . HEMOSTASIS CONTROL  04/08/2020   Procedure: HEMOSTASIS CONTROL;  Surgeon: Doran Stabler, MD;  Location: [facility] ENDOSCOPY;  Service: Gastroenterology;;  . Laretta Alstrom CONTROL  06/17/2020   Procedure: HEMOSTASIS CONTROL;  Surgeon: Irene Shipper, MD;  Location: [facility] ENDOSCOPY;  Service:  Endoscopy;;  . HERNIA REPAIR    . ICD IMPLANT N/A 09/03/2020   Procedure: ICD IMPLANT;  Surgeon: [name], MD;  Location: [facility] CV LAB;  Service: Cardiovascular;  Laterality: N/A;  . RIGHT/LEFT HEART CATH AND CORONARY ANGIOGRAPHY N/A 10/14/2019   Procedure: RIGHT/LEFT HEART CATH AND CORONARY ANGIOGRAPHY;  Surgeon: Belva Crome, MD;  Location: [facility] CV LAB;  Service: Cardiovascular;  Laterality: N/A;       Family History  Problem Relation Age of Onset  . Heart failure Mother   . Mental illness Sister   . Mental illness Sister     Social History   Tobacco Use  . 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 Smokeless tobacco: Never Used  Vaping Use  . Vaping Use: Never used  Substance Use Topics  . Alcohol use: Not Currently    Alcohol/week: 2.0 standard drinks    Types: 2 Cans of beer per week  . Drug use: No    Home Medications Prior to Admission medications   Medication Sig Start Date End Date Taking? Authorizing Provider  acetaminophen (TYLENOL) 500 MG tablet Take 2 tablets (1,000 mg total) by mouth in the morning and at bedtime. 09/13/20  Yes Azzie Glatter, FNP  albuterol (VENTOLIN HFA) 108 (90 Base) MCG/ACT inhaler INHALE 2 PUFFS INTO THE LUNGS EVERY 6 (SIX) HOURS AS NEEDED FOR WHEEZING OR SHORTNESS OF BREATH. 01/19/21 01/19/22 Yes Azzie Glatter, FNP  apixaban (ELIQUIS) 5 MG TABS tablet Take 1 tablet (5 mg total) by mouth 2 (two) times daily. 03/02/21 05/02/21 Yes Simmons, Brittainy M, PA-C  atorvastatin (LIPITOR) 40 MG tablet Take 1 tablet (40 mg total) by mouth daily at 6 PM. 03/15/20 04/16/21 Yes Turner, Eber Hong, MD  buPROPion (WELLBUTRIN SR) 100 MG 12 hr tablet TAKE 1 TABLET (100 MG TOTAL) BY MOUTH 2 (TWO) TIMES DAILY. Patient taking differently: Take 100 mg by mouth 2 (two) times daily. 04/16/20 04/16/21 Yes Azzie Glatter, FNP  carvedilol (COREG) 25 MG tablet TAKE 1 TABLET (25 MG TOTAL) BY MOUTH 2 (TWO) TIMES DAILY WITH A MEAL. Patient taking differently:  Take 25 mg by mouth 2 (two) times daily with a meal. 11/02/20 11/02/21 Yes [name], MD  cyclobenzaprine (FLEXERIL) 10 MG tablet Take 10 mg by mouth 3 (three) times daily as needed for muscle spasms.   Yes [provider]  dapagliflozin propanediol (FARXIGA) 10 MG TABS tablet Take 1 tablet (10 mg total) by mouth daily before breakfast. 03/29/21  Yes [name], MD  ferrous sulfate 325 (65 FE) MG tablet Take 325 mg by mouth in the morning and at bedtime.   Yes [provider]  furosemide (LASIX) 80 MG tablet Take 1.5 tablets (120 mg total) by mouth in the morning AND 1 tablet (80 mg total) every evening. 03/29/21 03/29/22 Yes [name], MD  gabapentin (NEURONTIN) 300 MG capsule TAKE 2 CAPSULES (TOTAL = 600 MG), BY MOUTH, 3 TIMES A DAY. Patient taking differently: Take 600 mg by mouth 3 (three) times daily. 09/13/20 09/13/21 Yes Azzie Glatter, FNP  glipiZIDE (GLUCOTROL) 10 MG tablet TAKE 1 TABLET (10 MG TOTAL) BY MOUTH 2 (  TWO) TIMES DAILY BEFORE A MEAL. Patient taking differently: Take 10 mg by mouth 2 (two) times daily. 10/05/20 10/05/21 Yes Azzie Glatter, FNP  hydrALAZINE (APRESOLINE) 25 MG tablet TAKE 1.5 TABLETS (37.5 MG TOTAL) BY MOUTH 3 (THREE) TIMES DAILY. Patient taking differently: Take 37.5 mg by mouth 3 (three) times daily. 11/03/20 11/03/21 Yes [name], MD  insulin glargine (LANTUS) 100 UNIT/ML injection INJECT 0.2 MLS (20 UNITS TOTAL) INTO THE SKIN DAILY. Patient taking differently: Inject 20 Units into the skin daily. 10/06/20 10/06/21 Yes Azzie Glatter, FNP  Insulin Lispro Prot & Lispro (HUMALOG MIX 75/25 KWIKPEN) (75-25) 100 UNIT/ML Kwikpen Inject 30 Units into the skin 2 (two) times daily. 02/18/20  Yes Azzie Glatter, FNP  isosorbide mononitrate (IMDUR) 30 MG 24 hr tablet Take 1 tablet (30 mg total) by mouth daily. 03/29/21 03/29/22 Yes [name], MD  metFORMIN (GLUCOPHAGE) 500 MG tablet TAKE 1 TABLET (500 MG TOTAL) BY MOUTH 2 (TWO)  TIMES DAILY WITH A MEAL. Patient taking differently: Take 500 mg by mouth 2 (two) times daily with a meal. 10/05/20 10/05/21 Yes Azzie Glatter, FNP  Multiple Vitamins-Minerals (CENTRUM SILVER 50+MEN) TABS Take 1 tablet by mouth daily.   Yes [provider]  pantoprazole (PROTONIX) 40 MG tablet TAKE 1 TABLET (40 MG TOTAL) BY MOUTH 2 (TWO) TIMES DAILY. Patient taking differently: Take 40 mg by mouth 2 (two) times daily. 03/17/21 03/17/22 Yes [name], MD  QUEtiapine (SEROQUEL) 300 MG tablet TAKE 1 TABLET BY MOUTH AT BEDTIME. Patient taking differently: Take 300 mg by mouth at bedtime. 02/16/21 02/16/22 Yes   sacubitril-valsartan (ENTRESTO) 97-103 MG Take 1 tablet by mouth 2 (two) times daily.   Yes [provider]  spironolactone (ALDACTONE) 25 MG tablet Take 1 tablet (25 mg total) by mouth daily. 03/22/21  Yes Clegg, [name] D, NP  tetrahydrozoline 0.05 % ophthalmic solution Place 1 drop into both eyes daily.   Yes [provider]  VIAGRA 25 MG tablet Take 4 tablets (100 mg total) by mouth daily as needed for erectile dysfunction. 04/01/21  Yes [name], NP  albuterol (PROVENTIL) (2.5 MG/3ML) 0.083% nebulizer solution Take 3 mLs (2.5 mg total) by nebulization every 4 (four) hours as needed for wheezing or shortness of breath. 04/01/21 04/01/22  [name], NP  blood glucose meter kit and supplies KIT Dispense based on patient and insurance preference. Use up to four times daily as directed. (FOR ICD-9 250.00, 250.01). 10/16/19   Barb Merino, MD  Continuous Blood Gluc Receiver (FREESTYLE LIBRE 57 DAY READER) DEVI 1 each by Does not apply route as needed. 10/26/20   Azzie Glatter, FNP  Insulin Pen Needle 31G X 6 MM MISC USE AS DIRECTED 11/04/20 11/04/21  Azzie Glatter, FNP  Insulin Syringe-Needle U-100 31G X 5/16" 0.3 ML MISC USE 3 TIMES DAILY TO INJECT INSULIN 10/26/20 10/26/21  Azzie Glatter, FNP  TRUEPLUS PEN NEEDLES 31G X 6 MM MISC USE AS DIRECTED  11/04/20   Azzie Glatter, FNP    Allergies    Patient has no known allergies.  Review of Systems   Review of Systems  Constitutional: Positive for fatigue. Negative for chills and fever.  HENT: Negative.   Respiratory: Positive for cough and shortness of breath.   Cardiovascular: Positive for chest pain and leg swelling. Negative for palpitations.  Gastrointestinal: Negative.   Genitourinary: Negative.  Negative for decreased urine volume.  Musculoskeletal: Negative.   Skin:  Negative.   Neurological: Positive for weakness and light-headedness.    Physical Exam Updated Vital Signs BP 117/67 (BP Location: Left Arm)   Pulse (!) 59   Temp 98.7 F (37.1 C) (Oral)   Resp 17   SpO2 96%   Physical Exam Vitals and nursing note reviewed. Exam conducted with a chaperone present.  Constitutional:      Appearance: He is obese. He is not toxic-appearing.  HENT:     Head: Normocephalic and atraumatic.     Nose: Nose normal.     Mouth/Throat:     Mouth: Mucous membranes are moist.     Pharynx: Oropharynx is clear. Uvula midline. No oropharyngeal exudate or posterior oropharyngeal erythema.     Tonsils: No tonsillar exudate.  Eyes:     General: Lids are normal. Vision grossly intact.        Right eye: No discharge.        Left eye: No discharge.     Extraocular Movements: Extraocular movements intact.     Conjunctiva/sclera: Conjunctivae normal.     Pupils: Pupils are equal, round, and reactive to light.  Neck:     Vascular: JVD present.     Trachea: Trachea and phonation normal.  Cardiovascular:     Rate and Rhythm: Normal rate and regular rhythm.     Pulses:          Dorsalis pedis pulses are 1+ on the right side and 1+ on the left side.     Heart sounds: Heart sounds are distant.  Pulmonary:     Effort: Pulmonary effort is normal. Tachypnea present. No bradypnea, accessory muscle usage, prolonged expiration or respiratory distress.     Breath sounds: Examination of the  left-middle field reveals wheezing. Examination of the right-lower field reveals rales. Examination of the left-lower field reveals rales. Wheezing and rales present.  Chest:     Chest wall: No mass, lacerations, deformity, swelling, tenderness, crepitus or edema.  Abdominal:     General: Bowel sounds are normal. There is distension.     Palpations: Abdomen is soft. There is fluid wave.     Tenderness: There is no abdominal tenderness. There is no right CVA tenderness, left CVA tenderness, guarding or rebound.  Genitourinary:    Rectum: Guaiac result positive. No external hemorrhoid or internal hemorrhoid. Normal anal tone.     Comments: Melanotic appearing stool on the gloved finger provider following rectal exam. Musculoskeletal:        General: No deformity.     Cervical back: Normal range of motion and neck supple. No edema, rigidity or crepitus. No pain with movement.     Right lower leg: 2+ Pitting Edema present.     Left lower leg: 2+ Pitting Edema present.  Skin:    General: Skin is warm and dry.  Neurological:     Mental Status: He is alert and oriented to person, place, and time. Mental status is at baseline.     Sensory: Sensation is intact.     Motor: Motor function is intact.  Psychiatric:        Mood and Affect: Mood normal.     ED Results / Procedures / Treatments   Labs (all labs ordered are listed, but only abnormal results are displayed) Labs Reviewed  BASIC METABOLIC PANEL - Abnormal; Notable for the following components:      Result Value   BUN 28 (*)    Creatinine, Ser 1.46 (*)    Calcium 8.4 (*)  GFR, Estimated 55 (*)    All other components within normal limits  CBC WITH DIFFERENTIAL/PLATELET - Abnormal; Notable for the following components:   RBC 2.45 (*)    Hemoglobin 6.9 (*)    HCT 22.5 (*)    nRBC 0.4 (*)    All other components within normal limits  BRAIN NATRIURETIC PEPTIDE - Abnormal; Notable for the following components:   B Natriuretic  Peptide 404.4 (*)    All other components within normal limits  POC OCCULT BLOOD, ED - Abnormal; Notable for the following components:   Fecal Occult Bld POSITIVE (*)    All other components within normal limits  TROPONIN I (HIGH SENSITIVITY) - Abnormal; Notable for the following components:   Troponin I (High Sensitivity) 198 (*)    All other components within normal limits  RESP PANEL BY RT-PCR (FLU A&B, COVID) ARPGX2  PREPARE RBC (CROSSMATCH)  TYPE AND SCREEN  TROPONIN I (HIGH SENSITIVITY)    EKG EKG Interpretation  Date/Time:  Wednesday Apr 13 2021 12:49:31 EDT Ventricular Rate:  64 PR Interval:  194 QRS Duration: 98 QT Interval:  440 QTC Calculation: 453 R Axis:   59 Text Interpretation: Sinus rhythm with Premature atrial complexes ST & T wave abnormality, consider inferior ischemia Abnormal ECG Confirmed by Quintella Reichert [phone]) on 04/13/2021 4:56:10 PM   Radiology DG Chest 2 View  Result Date: 04/13/2021 CLINICAL DATA:  Shortness of breath with leg swelling. History of CHF. EXAM: CHEST - 2 VIEW COMPARISON:  None. FINDINGS: Enlarged cardiac silhouette. Single lead left subclavian approach cardiac rhythm maintenance device with tip not imaged. Pulmonary vascular congestion. Hazy bilateral perihilar and bibasilar airspace opacities. No visible pleural effusions or pneumothorax. IMPRESSION: Cardiomegaly and pulmonary vascular congestion. Hazy bilateral perihilar and bibasilar opacities may represent mild edema. Infection is a less likely differential consideration. Electronically Signed   By: Margaretha Sheffield MD   On: 04/13/2021 14:08    Procedures .Critical Care Performed by: Emeline Darling, PA-C Authorized by: Emeline Darling, PA-C   Critical care provider statement:    Critical care time (minutes):  45   Critical care was necessary to treat or prevent imminent or life-threatening deterioration of the following conditions: symptomatic anemia, Hbg < 7.    Critical care was time spent personally by me on the following activities:  Discussions with consultants, evaluation of patient's response to treatment, examination of patient, ordering and performing treatments and interventions, ordering and review of laboratory studies, ordering and review of radiographic studies, pulse oximetry, re-evaluation of patient's condition, obtaining history from patient or surrogate and review of old charts    Medications Ordered in ED Medications  furosemide (LASIX) injection 40 mg (40 mg Intravenous Given 04/13/21 1459)  0.9 %  sodium chloride infusion (10 mL/hr Intravenous New Bag/Given 04/13/21 1657)  lactated ringers bolus 500 mL (500 mLs Intravenous New Bag/Given 04/13/21 1604)    ED Course  I have reviewed the triage vital signs and the nursing notes.  Pertinent labs & imaging results that were available during my care of the patient were reviewed by me and considered in my medical decision making (see chart for details).  Clinical Course as of 04/13/21 1701  Wed Apr 13, 2021  1441 Reeval of the patient, satting 77% when lying flat - he was sat up with improvement in his saturations to 98% on RA with good pleth. [RS]  1520 Critical Hbg of 6.9. Will HOLD eliquis. [RS]  2542 Consult to gastroenterology APP, Judson Roch  [place], who is agreeable to admit patient to GI consult service.  Recommends medical admission. I appreciate her collaboration in the care of this patient.  [RS]  1654 Troponin I (High Sensitivity)(!!): 198 Critical troponin, 198, likely demand.  EKG with some ST changes, however not significantly changed from prior. [RS]  9163 Consult to internal medicine, Dr. Gilford Rile. He is agreeable since patient admitting to their service.  Appreciate her collaboration of care with patient. [RS]    Clinical Course User Index [RS] Lumir Demetriou, Sharlene Dory   MDM Rules/Calculators/A&P                          60 year old male with history of CHF who  presents with concern for shortness of breath, leg swelling, and weight gain in the last week.  Differential diagnosis includes is not limited to CHF, pulmonary edema, pulmonary hypertension, PE, pneumonia, ACS.  Hypertensive on intake, vital signs otherwise normal.  Cardio pulmonary rales in the lung bases bilaterally as well as mild expiratory wheezing left midlung field.  There is pitting bilateral lower extremity edema and abdominal distention with positive fluid wave.  Chest x-ray with cardiomegaly and pulmonary vascular congestion hazy bilateral perihilar and bibasilar opacities likely mild edema. EKG with some ST changes, however no significant change from prior..  Patient appears clinically fluid overloaded.  We will proceed with IV Lasix at this time.  CBC with critical anemia, hemoglobin 6.9.  Further review of patient's EMR revealed patient with history of gastric and jejunal AVMs, and clips in the past.  History of iron deficiency anemia secondary to chronic blood loss from GI bleeds.  He has required transfusion in the past.  Discussed transfusion today with the patient and amenable for plan.  He voiced understanding of the risks versus benefits and expressed wishes to proceed with transfusion at this time. BMP With mild decrease in creatinine 1.46.  BNP elevated to 4 4.  We will proceed with type and screen, respiratory pathogen panel.  Given revelation of critically low hemoglobin in context of dark tarry stools, as well as clinical fluid overload, feel patient would benefit from admission to the hospitalist time.  Consult to gastroenterology team.  Will consult admission team.   Critical troponin, 198, likely demand ischemia given reassuring EKG.  Will trend on the inpatient side.  Additionally will hold Eliquis and patient given severe anemia.  Consult to internal medicine, Dr. Gilford Rile, is agreeable to seeing patient admitting to his service.  Appreciate his collaboration with care of  this patient.  Rylend voiced understanding with medical evaluation and treatment plan thus far.  Each of his questions was answered to his expressed satisfaction.  He is amenable to plan for admission at this time.  This chart was dictated using voice recognition software, Dragon. Despite the best efforts of this provider to proofread and correct errors, errors may still occur which can change documentation meaning. Final Clinical Impression(s) / ED Diagnoses Final diagnoses:  None    Rx / DC Orders ED Discharge Orders    None       Aura Dials 04/13/21 1701    Quintella Reichert, MD 04/14/21 [phone]

## 2021-04-13 NOTE — ED Notes (Signed)
Report called, RN aware blood transfusion to still be started.

## 2021-04-13 NOTE — Consult Note (Addendum)
[facility] Consult: 8:17 AM 04/14/2021  LOS: 0 days    Referring Provider: Dr Dorian Pod Primary Care Physician:  Azzie Glatter, FNP (Inactive) Primary Gastroenterologist:  unassigned    Reason for Consultation:  Anemia, FOBT + dark stool.  Pt on Eliquis.   HPI: [name] is a 60 y.o. male.  PMH Ischemic cardiomyopathy.  ICD implanted 08/2020. IDDM.  Chronic Eliquis.  Cardiac stent 2012.  PAF.  Schizophrenia.  Hx recurrent severe anemia, FOBT + stools.  Hgb was 5 in 2017 when he underwent unrevealing EGD and colonoscopy at [facility].  Tubular adenoma resected from transverse colon. 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  4 nonbleeding AVMs in the duodenum.  Started after that on oral iron and Eliquis resumed.   Recurrent anemia, FOBT + 05/2020. 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  Sister brought patient to ED ED 5/18 in afternoon w dyspnea, dizziness, 21 pound weight gain in 1 week.  For about a week he has had formed, dark/black stools but no obvious blood.  Good appetite.  No nausea or vomiting.  Epigastric discomfort not associated with or without meals.  Hgb 6.9 >> 2 PRBC >> 8.2.  Was between 48 and 13 nine weeks ago in early March.  MCV 91.  Platelets normal. AKI/progressive CKD.  BNP 404. CXR: Centimeters.  Pulmonary vascular congestion.  Hazy by lateral lung opacities, may represent mild edema and is less likely infection.  Last dose Eliquis a.m. 5/18.  Compliant with daily a.m. iron and Protonix 40 mg daily. A little over 2 weeks ago Dr. Algernon Huxley increased Lasix to 120 mg in the morning, 80 mg in the evening, isosorbide to 30 mg daily and Jardiance added.  Patient is  followed closely by the heart failure clinic with weekly home visits.  Has been having housing problems.  No current alcohol or illegal drug use.    Past Medical History:  Diagnosis Date  . Anxiety   . CAD (coronary artery disease)   . CHF (congestive heart failure) ([place]) 09/2019  . Depression   . Diabetes mellitus   . Erectile dysfunction 11/2019  . H/O right heart catheterization 09/2019  . Hypertension   . Schizophrenia ([place])   . Sleep apnea    uses cpap    Past Surgical History:  Procedure Laterality Date  . CORONARY STENT PLACEMENT    . ENTEROSCOPY N/A 04/08/2020   Procedure: ENTEROSCOPY;  Surgeon: Doran Stabler, MD;  Location: [facility] ENDOSCOPY;  Service: Gastroenterology;  Laterality: N/A;  . ENTEROSCOPY N/A 06/17/2020   Procedure: ENTEROSCOPY;  Surgeon: Irene Shipper, MD;  Location: [facility] ENDOSCOPY;  Service: Endoscopy;  Laterality: N/A;  . HEMOSTASIS CLIP PLACEMENT  04/08/2020   Procedure: HEMOSTASIS CLIP PLACEMENT;  Surgeon: Doran Stabler, MD;  Location: [place];  Service: Gastroenterology;;  . HEMOSTASIS CONTROL  04/08/2020   Procedure: HEMOSTASIS CONTROL;  Surgeon: Doran Stabler, MD;  Location: [place];  Service: Gastroenterology;;  . Laretta Alstrom CONTROL  06/17/2020   Procedure: HEMOSTASIS CONTROL;  Surgeon: Irene Shipper, MD;  Location: [facility] ENDOSCOPY;  Service: Endoscopy;;  . HERNIA REPAIR    . ICD IMPLANT N/A 09/03/2020   Procedure: ICD IMPLANT;  Surgeon: [name], MD;  Location: [place] CV LAB;  Service: Cardiovascular;  Laterality: N/A;  . RIGHT/LEFT HEART CATH AND CORONARY ANGIOGRAPHY N/A 10/14/2019   Procedure: RIGHT/LEFT HEART CATH AND CORONARY ANGIOGRAPHY;  Surgeon: Belva Crome, MD;  Location: [place] CV LAB;  Service: Cardiovascular;  Laterality: N/A;    Prior to Admission medications   Medication Sig Start Date End Date Taking? Authorizing Provider  acetaminophen (TYLENOL) 500 MG tablet Take 2 tablets (1,000 mg total) by  mouth in the morning and at bedtime. 09/13/20  Yes Azzie Glatter, FNP  albuterol (VENTOLIN HFA) 108 (90 Base) MCG/ACT inhaler INHALE 2 PUFFS INTO THE LUNGS EVERY 6 (SIX) HOURS AS NEEDED FOR WHEEZING OR SHORTNESS OF BREATH. 01/19/21 01/19/22 Yes Azzie Glatter, FNP  apixaban (ELIQUIS) 5 MG TABS tablet Take 1 tablet (5 mg total) by mouth 2 (two) times daily. 03/02/21 05/02/21 Yes Simmons, Brittainy M, PA-C  atorvastatin (LIPITOR) 40 MG tablet Take 1 tablet (40 mg total) by mouth daily at 6 PM. 03/15/20 04/16/21 Yes Turner, Eber Hong, MD  buPROPion (WELLBUTRIN SR) 100 MG 12 hr tablet TAKE 1 TABLET (100 MG TOTAL) BY MOUTH 2 (TWO) TIMES DAILY. Patient taking differently: Take 100 mg by mouth 2 (two) times daily. 04/16/20 04/16/21 Yes Azzie Glatter, FNP  carvedilol (COREG) 25 MG tablet TAKE 1 TABLET (25 MG TOTAL) BY MOUTH 2 (TWO) TIMES DAILY WITH A MEAL. Patient taking differently: Take 25 mg by mouth 2 (two) times daily with a meal. 11/02/20 11/02/21 Yes [name], MD  cyclobenzaprine (FLEXERIL) 10 MG tablet Take 10 mg by mouth 3 (three) times daily as needed for muscle spasms.   Yes [provider]  dapagliflozin propanediol (FARXIGA) 10 MG TABS tablet Take 1 tablet (10 mg total) by mouth daily before breakfast. 03/29/21  Yes [name], MD  ferrous sulfate 325 (65 FE) MG tablet Take 325 mg by mouth in the morning and at bedtime.   Yes [provider]  furosemide (LASIX) 80 MG tablet Take 1.5 tablets (120 mg total) by mouth in the morning AND 1 tablet (80 mg total) every evening. 03/29/21 03/29/22 Yes [name], MD  gabapentin (NEURONTIN) 300 MG capsule TAKE 2 CAPSULES (TOTAL = 600 MG), BY MOUTH, 3 TIMES A DAY. Patient taking differently: Take 600 mg by mouth 3 (three) times daily. 09/13/20 09/13/21 Yes Azzie Glatter, FNP  glipiZIDE (GLUCOTROL) 10 MG tablet TAKE 1 TABLET (10 MG TOTAL) BY MOUTH 2 (TWO) TIMES DAILY BEFORE A MEAL. Patient taking differently: Take 10 mg by  mouth 2 (two) times daily. 10/05/20 10/05/21 Yes Azzie Glatter, FNP  hydrALAZINE (APRESOLINE) 25 MG tablet TAKE 1.5 TABLETS (37.5 MG TOTAL) BY MOUTH 3 (THREE) TIMES DAILY. Patient taking differently: Take 37.5 mg by mouth 3 (three) times daily. 11/03/20 11/03/21 Yes [name], MD  insulin glargine (LANTUS) 100 UNIT/ML injection INJECT 0.2 MLS (20 UNITS TOTAL) INTO THE SKIN DAILY. Patient taking differently: Inject 20 Units into the skin daily. 10/06/20 10/06/21 Yes Azzie Glatter, FNP  Insulin Lispro Prot & Lispro (HUMALOG MIX 75/25 KWIKPEN) (75-25) 100 UNIT/ML Kwikpen Inject 30 Units into the skin 2 (two) times daily. 02/18/20  Yes Kathe Becton  M, FNP  isosorbide mononitrate (IMDUR) 30 MG 24 hr tablet Take 1 tablet (30 mg total) by mouth daily. 03/29/21 03/29/22 Yes [name], MD  metFORMIN (GLUCOPHAGE) 500 MG tablet TAKE 1 TABLET (500 MG TOTAL) BY MOUTH 2 (TWO) TIMES DAILY WITH A MEAL. Patient taking differently: Take 500 mg by mouth 2 (two) times daily with a meal. 10/05/20 10/05/21 Yes Azzie Glatter, FNP  Multiple Vitamins-Minerals (CENTRUM SILVER 50+MEN) TABS Take 1 tablet by mouth daily.   Yes [provider]  pantoprazole (PROTONIX) 40 MG tablet TAKE 1 TABLET (40 MG TOTAL) BY MOUTH 2 (TWO) TIMES DAILY. Patient taking differently: Take 40 mg by mouth 2 (two) times daily. 03/17/21 03/17/22 Yes [name], MD  QUEtiapine (SEROQUEL) 300 MG tablet TAKE 1 TABLET BY MOUTH AT BEDTIME. Patient taking differently: Take 300 mg by mouth at bedtime. 02/16/21 02/16/22 Yes   sacubitril-valsartan (ENTRESTO) 97-103 MG Take 1 tablet by mouth 2 (two) times daily.   Yes [provider]  spironolactone (ALDACTONE) 25 MG tablet Take 1 tablet (25 mg total) by mouth daily. 03/22/21  Yes Clegg, [name] D, NP  tetrahydrozoline 0.05 % ophthalmic solution Place 1 drop into both eyes daily.   Yes [provider]  VIAGRA 25 MG tablet Take 4 tablets (100 mg total) by mouth daily as  needed for erectile dysfunction. 04/01/21  Yes [name], NP  albuterol (PROVENTIL) (2.5 MG/3ML) 0.083% nebulizer solution Take 3 mLs (2.5 mg total) by nebulization every 4 (four) hours as needed for wheezing or shortness of breath. 04/01/21 04/01/22  [name], NP  blood glucose meter kit and supplies KIT Dispense based on patient and insurance preference. Use up to four times daily as directed. (FOR ICD-9 250.00, 250.01). 10/16/19   Barb Merino, MD  Continuous Blood Gluc Receiver (FREESTYLE LIBRE 46 DAY READER) DEVI 1 each by Does not apply route as needed. 10/26/20   Azzie Glatter, FNP  Insulin Pen Needle 31G X 6 MM MISC USE AS DIRECTED 11/04/20 11/04/21  Azzie Glatter, FNP  Insulin Syringe-Needle U-100 31G X 5/16" 0.3 ML MISC USE 3 TIMES DAILY TO INJECT INSULIN 10/26/20 10/26/21  Azzie Glatter, FNP  TRUEPLUS PEN NEEDLES 31G X 6 MM MISC USE AS DIRECTED 11/04/20   Azzie Glatter, FNP    Scheduled Meds: . atorvastatin  40 mg Oral q1800  . carvedilol  25 mg Oral BID WC  . dapagliflozin propanediol  10 mg Oral QAC breakfast  . furosemide  80 mg Intravenous Once  . insulin aspart  0-20 Units Subcutaneous Q4H  . insulin glargine  20 Units Subcutaneous Daily  . pantoprazole  40 mg Oral BID  . QUEtiapine  300 mg Oral QHS   Infusions:  PRN Meds:    Allergies as of 04/13/2021  . (No Known Allergies)    Family History  Problem Relation Age of Onset  . Heart failure Mother   . Mental illness Sister   . Mental illness Sister     Social History   Socioeconomic History  . Marital status: Legally Separated    Spouse name: Not on file  . Number of children: Not on file  . Years of education: Not on file  . Highest education level: Not on file  Occupational History  . Not on file  Tobacco Use  . 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 Smokeless tobacco: Never Used  Vaping Use  . Vaping Use: Never used  Substance and  Sexual Activity  . Alcohol use: Not Currently     Alcohol/week: 2.0 standard drinks    Types: 2 Cans of beer per week  . Drug use: No  . Sexual activity: Yes  Other Topics Concern  . Not on file  Social History Narrative  . Not on file   Social Determinants of Health   Financial Resource Strain: Not on file  Food Insecurity: No Food Insecurity  . Worried About Charity fundraiser in the Last Year: Never true  . Ran Out of Food in the Last Year: Never true  Transportation Needs: Unmet Transportation Needs  . Lack of Transportation (Medical): Yes  . Lack of Transportation (Non-Medical): Yes  Physical Activity: Not on file  Stress: Not on file  Social Connections: Not on file  Intimate Partner Violence: Not on file    REVIEW OF SYSTEMS: Constitutional: Weakness, fatigue. ENT:  No nose bleeds Pulm: Not using CPAP at home as prescribed, says it is uncomfortable so he is not using it.  Shortness of breath and nonpurulent cough. CV:  No palpitations, no LE edema.  No angina GU:  No hematuria, no frequency GI: See HPI Heme: Denies excessive or unusual bleeding/bruising. Transfusions: See HPI. Neuro:  No headaches, no peripheral tingling or numbness.  No syncope, no seizures Derm:  No itching, no rash or sores.  Endocrine:  No sweats or chills.  No polyuria or dysuria Immunization: Reviewed. Travel:  None beyond local counties in last few months.    PHYSICAL EXAM: Vital signs in last 24 hours: Vitals:   04/14/21 0158 04/14/21 0542  BP: 135/83 109/65  Pulse: 70 83  Resp: 16 19  Temp: 98.4 F (36.9 C) 98.8 F (37.1 C)  SpO2: 97% (!) 83%   Wt Readings from Last 3 Encounters:  04/14/21 (!) 155.6 kg  04/01/21 (!) 152.4 kg  03/29/21 (!) 153.4 kg    General: Morbidly obese, pickwickian, chronically ill-appearing.  Somnolent but easily aroused.  Speech is hard to understand. Head: No facial asymmetry or signs of head trauma. Eyes: Conjunctiva pale.  No scleral icterus. Ears: No obvious hearing deficit Nose: Sounds  congested. Mouth: Moist, pink, clear mucosa.  Few teeth remain.  Tongue midline. Neck: No JVD, no masses, no thyromegaly Lungs: Diminished bilaterally.  Mucoid cough with wheezing.  Some dyspnea with speech. Heart: Irregularly irregular.  Telemetry with NSR and PVCs. Abdomen: Obese.  Minimally tense.  Nontender.  No HSM, bruits, hernias..   GU: No scrotal edema. Rectal: Deferred.  Specimen in lab yesterday was FOBT positive Musc/Skeltl: No joint redness, swelling or gross deformity. Extremities: Lower extremity edema. Neurologic: Appropriate.  Oriented x3.  Speech is mumbled/garbled but appropriate.  He is hard to understand.  Moves all 4 limbs.  No tremors. Skin: No suspicious lesions, rashes. Nodes: No cervical adenopathy Psych: Calm, cooperative, pleasant.  Intake/Output from previous day: 05/18 0701 - 05/19 0700 In: 1382.5 [I.V.:10; Blood:872.5; IV Piggyback:500] Out: 2000 [Urine:2000] Intake/Output this shift: No intake/output data recorded.  LAB RESULTS: Recent Labs    04/13/21 1307 04/14/21 0322  WBC 5.4 6.9  HGB 6.9* 8.2*  HCT 22.5* 27.0*  PLT 231 247   BMET Lab Results  Component Value Date   NA 139 04/14/2021   NA 141 04/13/2021   NA 139 04/13/2021   K 3.8 04/14/2021   K 3.6 04/13/2021   K 4.1 04/13/2021   CL 102 04/14/2021   CL 105 04/13/2021   CL 104 04/13/2021   CO2  31 04/14/2021   CO2 30 04/13/2021   CO2 28 04/13/2021   GLUCOSE 135 (H) 04/14/2021   GLUCOSE 75 04/13/2021   GLUCOSE 72 04/13/2021   BUN 25 (H) 04/14/2021   BUN 27 (H) 04/13/2021   BUN 28 (H) 04/13/2021   CREATININE 1.49 (H) 04/14/2021   CREATININE 1.45 (H) 04/13/2021   CREATININE 1.46 (H) 04/13/2021   CALCIUM 8.5 (L) 04/14/2021   CALCIUM 8.1 (L) 04/13/2021   CALCIUM 8.4 (L) 04/13/2021   LFT Recent Labs    04/13/21 1756  PROT 6.4*  ALBUMIN 2.9*  AST 36  ALT 36  ALKPHOS 51  BILITOT 0.7   PT/INR Lab Results  Component Value Date   INR 0.88 09/23/2011   Hepatitis  Panel No results for input(s): HEPBSAG, HCVAB, HEPAIGM, HEPBIGM in the last 72 hours. C-Diff No components found for: CDIFF Lipase  No results found for: LIPASE  Drugs of Abuse     Component Value Date/Time   LABOPIA NONE DETECTED 06/15/2020 1414   COCAINSCRNUR NONE DETECTED 06/15/2020 1414   LABBENZ NONE DETECTED 06/15/2020 1414   AMPHETMU NONE DETECTED 06/15/2020 1414   THCU NONE DETECTED 06/15/2020 1414   LABBARB NONE DETECTED 06/15/2020 1414     RADIOLOGY STUDIES: DG Chest 2 View  Result Date: 04/13/2021 CLINICAL DATA:  Shortness of breath with leg swelling. History of CHF. EXAM: CHEST - 2 VIEW COMPARISON:  None. FINDINGS: Enlarged cardiac silhouette. Single lead left subclavian approach cardiac rhythm maintenance device with tip not imaged. Pulmonary vascular congestion. Hazy bilateral perihilar and bibasilar airspace opacities. No visible pleural effusions or pneumothorax. IMPRESSION: Cardiomegaly and pulmonary vascular congestion. Hazy bilateral perihilar and bibasilar opacities may represent mild edema. Infection is a less likely differential consideration. Electronically Signed   By: Margaretha Sheffield MD   On: 04/13/2021 14:08     IMPRESSION:   *   FOBT + anemia.  Hgb better after 2 PRBCs.    Suspect blood loss from AVMs.  Previous bleeding as well as nonbleeding AVMs encountered in duodenum and stomach on SBEs in 2021  *   CHF.  Latest echocardiogram 03/2020 with LVEF 30%, grade 2 DD.  Mild pulmonary artery hypertension.  No significant valvular disorders. Weight gain, edema and CHF per x-ray.  *    Chronic Eliquis.  On hold.  Last dose a.m. 5/18  *    AKI.     *   IDDM  *  OSA.  Says he is not using CPAP at home.     PLAN:     *   Plan SPE tomorrow after 2-day Eliquis washout.  This will also allow for another 24 hours to tune up his cardiorespiratory status  *    okay to eat solid food today, I ordered heart healthy/carb modified diet.  *    continue his  Protonix 40 mg po bid as doing.     Azucena Freed  04/14/2021, 8:17 AM Phone [phone]

## 2021-04-13 NOTE — H&P (View-Only) (Signed)
[facility] Consult: 8:17 AM 04/14/2021  LOS: 0 days    Referring Provider: Dr Dorian Pod Primary Care Physician:  Azzie Glatter, FNP (Inactive) Primary Gastroenterologist:  unassigned    Reason for Consultation:  Anemia, FOBT + dark stool.  Pt on Eliquis.   HPI: [name] is a 60 y.o. male.  PMH Ischemic cardiomyopathy.  ICD implanted 08/2020. IDDM.  Chronic Eliquis.  Cardiac stent 2012.  PAF.  Schizophrenia.  Hx recurrent severe anemia, FOBT + stools.  Hgb was 5 in 2017 when he underwent unrevealing EGD and colonoscopy at [facility].  Tubular adenoma resected from transverse colon. 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  4 nonbleeding AVMs in the duodenum.  Started after that on oral iron and Eliquis resumed.   Recurrent anemia, FOBT + 05/2020. 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  Sister brought patient to ED ED 5/18 in afternoon w dyspnea, dizziness, 21 pound weight gain in 1 week.  For about a week he has had formed, dark/black stools but no obvious blood.  Good appetite.  No nausea or vomiting.  Epigastric discomfort not associated with or without meals.  Hgb 6.9 >> 2 PRBC >> 8.2.  Was between 64 and 13 nine weeks ago in early March.  MCV 91.  Platelets normal. AKI/progressive CKD.  BNP 404. CXR: Centimeters.  Pulmonary vascular congestion.  Hazy by lateral lung opacities, may represent mild edema and is less likely infection.  Last dose Eliquis a.m. 5/18.  Compliant with daily a.m. iron and Protonix 40 mg daily. A little over 2 weeks ago Dr. Algernon Huxley increased Lasix to 120 mg in the morning, 80 mg in the evening, isosorbide to 30 mg daily and Jardiance added.  Patient is  followed closely by the heart failure clinic with weekly home visits.  Has been having housing problems.  No current alcohol or illegal drug use.    Past Medical History:  Diagnosis Date  . Anxiety   . CAD (coronary artery disease)   . CHF (congestive heart failure) ([place]) 09/2019  . Depression   . Diabetes mellitus   . Erectile dysfunction 11/2019  . H/O right heart catheterization 09/2019  . Hypertension   . Schizophrenia ([place])   . Sleep apnea    uses cpap    Past Surgical History:  Procedure Laterality Date  . CORONARY STENT PLACEMENT    . ENTEROSCOPY N/A 04/08/2020   Procedure: ENTEROSCOPY;  Surgeon: Doran Stabler, MD;  Location: [facility] ENDOSCOPY;  Service: Gastroenterology;  Laterality: N/A;  . ENTEROSCOPY N/A 06/17/2020   Procedure: ENTEROSCOPY;  Surgeon: Irene Shipper, MD;  Location: [facility] ENDOSCOPY;  Service: Endoscopy;  Laterality: N/A;  . HEMOSTASIS CLIP PLACEMENT  04/08/2020   Procedure: HEMOSTASIS CLIP PLACEMENT;  Surgeon: Doran Stabler, MD;  Location: [place];  Service: Gastroenterology;;  . HEMOSTASIS CONTROL  04/08/2020   Procedure: HEMOSTASIS CONTROL;  Surgeon: Doran Stabler, MD;  Location: [place];  Service: Gastroenterology;;  . Laretta Alstrom CONTROL  06/17/2020   Procedure: HEMOSTASIS CONTROL;  Surgeon: Irene Shipper, MD;  Location: [facility] ENDOSCOPY;  Service: Endoscopy;;  . HERNIA REPAIR    . ICD IMPLANT N/A 09/03/2020   Procedure: ICD IMPLANT;  Surgeon: [name], MD;  Location: [place] CV LAB;  Service: Cardiovascular;  Laterality: N/A;  . RIGHT/LEFT HEART CATH AND CORONARY ANGIOGRAPHY N/A 10/14/2019   Procedure: RIGHT/LEFT HEART CATH AND CORONARY ANGIOGRAPHY;  Surgeon: Belva Crome, MD;  Location: [place] CV LAB;  Service: Cardiovascular;  Laterality: N/A;    Prior to Admission medications   Medication Sig Start Date End Date Taking? Authorizing Provider  acetaminophen (TYLENOL) 500 MG tablet Take 2 tablets (1,000 mg total) by  mouth in the morning and at bedtime. 09/13/20  Yes Azzie Glatter, FNP  albuterol (VENTOLIN HFA) 108 (90 Base) MCG/ACT inhaler INHALE 2 PUFFS INTO THE LUNGS EVERY 6 (SIX) HOURS AS NEEDED FOR WHEEZING OR SHORTNESS OF BREATH. 01/19/21 01/19/22 Yes Azzie Glatter, FNP  apixaban (ELIQUIS) 5 MG TABS tablet Take 1 tablet (5 mg total) by mouth 2 (two) times daily. 03/02/21 05/02/21 Yes Simmons, Brittainy M, PA-C  atorvastatin (LIPITOR) 40 MG tablet Take 1 tablet (40 mg total) by mouth daily at 6 PM. 03/15/20 04/16/21 Yes Turner, Eber Hong, MD  buPROPion (WELLBUTRIN SR) 100 MG 12 hr tablet TAKE 1 TABLET (100 MG TOTAL) BY MOUTH 2 (TWO) TIMES DAILY. Patient taking differently: Take 100 mg by mouth 2 (two) times daily. 04/16/20 04/16/21 Yes Azzie Glatter, FNP  carvedilol (COREG) 25 MG tablet TAKE 1 TABLET (25 MG TOTAL) BY MOUTH 2 (TWO) TIMES DAILY WITH A MEAL. Patient taking differently: Take 25 mg by mouth 2 (two) times daily with a meal. 11/02/20 11/02/21 Yes [name], MD  cyclobenzaprine (FLEXERIL) 10 MG tablet Take 10 mg by mouth 3 (three) times daily as needed for muscle spasms.   Yes [provider]  dapagliflozin propanediol (FARXIGA) 10 MG TABS tablet Take 1 tablet (10 mg total) by mouth daily before breakfast. 03/29/21  Yes [name], MD  ferrous sulfate 325 (65 FE) MG tablet Take 325 mg by mouth in the morning and at bedtime.   Yes [provider]  furosemide (LASIX) 80 MG tablet Take 1.5 tablets (120 mg total) by mouth in the morning AND 1 tablet (80 mg total) every evening. 03/29/21 03/29/22 Yes [name], MD  gabapentin (NEURONTIN) 300 MG capsule TAKE 2 CAPSULES (TOTAL = 600 MG), BY MOUTH, 3 TIMES A DAY. Patient taking differently: Take 600 mg by mouth 3 (three) times daily. 09/13/20 09/13/21 Yes Azzie Glatter, FNP  glipiZIDE (GLUCOTROL) 10 MG tablet TAKE 1 TABLET (10 MG TOTAL) BY MOUTH 2 (TWO) TIMES DAILY BEFORE A MEAL. Patient taking differently: Take 10 mg by  mouth 2 (two) times daily. 10/05/20 10/05/21 Yes Azzie Glatter, FNP  hydrALAZINE (APRESOLINE) 25 MG tablet TAKE 1.5 TABLETS (37.5 MG TOTAL) BY MOUTH 3 (THREE) TIMES DAILY. Patient taking differently: Take 37.5 mg by mouth 3 (three) times daily. 11/03/20 11/03/21 Yes [name], MD  insulin glargine (LANTUS) 100 UNIT/ML injection INJECT 0.2 MLS (20 UNITS TOTAL) INTO THE SKIN DAILY. Patient taking differently: Inject 20 Units into the skin daily. 10/06/20 10/06/21 Yes Azzie Glatter, FNP  Insulin Lispro Prot & Lispro (HUMALOG MIX 75/25 KWIKPEN) (75-25) 100 UNIT/ML Kwikpen Inject 30 Units into the skin 2 (two) times daily. 02/18/20  Yes Kathe Becton  M, FNP  isosorbide mononitrate (IMDUR) 30 MG 24 hr tablet Take 1 tablet (30 mg total) by mouth daily. 03/29/21 03/29/22 Yes [name], MD  metFORMIN (GLUCOPHAGE) 500 MG tablet TAKE 1 TABLET (500 MG TOTAL) BY MOUTH 2 (TWO) TIMES DAILY WITH A MEAL. Patient taking differently: Take 500 mg by mouth 2 (two) times daily with a meal. 10/05/20 10/05/21 Yes Azzie Glatter, FNP  Multiple Vitamins-Minerals (CENTRUM SILVER 50+MEN) TABS Take 1 tablet by mouth daily.   Yes [provider]  pantoprazole (PROTONIX) 40 MG tablet TAKE 1 TABLET (40 MG TOTAL) BY MOUTH 2 (TWO) TIMES DAILY. Patient taking differently: Take 40 mg by mouth 2 (two) times daily. 03/17/21 03/17/22 Yes [name], MD  QUEtiapine (SEROQUEL) 300 MG tablet TAKE 1 TABLET BY MOUTH AT BEDTIME. Patient taking differently: Take 300 mg by mouth at bedtime. 02/16/21 02/16/22 Yes   sacubitril-valsartan (ENTRESTO) 97-103 MG Take 1 tablet by mouth 2 (two) times daily.   Yes [provider]  spironolactone (ALDACTONE) 25 MG tablet Take 1 tablet (25 mg total) by mouth daily. 03/22/21  Yes Clegg, [name] D, NP  tetrahydrozoline 0.05 % ophthalmic solution Place 1 drop into both eyes daily.   Yes [provider]  VIAGRA 25 MG tablet Take 4 tablets (100 mg total) by mouth daily as  needed for erectile dysfunction. 04/01/21  Yes [name], NP  albuterol (PROVENTIL) (2.5 MG/3ML) 0.083% nebulizer solution Take 3 mLs (2.5 mg total) by nebulization every 4 (four) hours as needed for wheezing or shortness of breath. 04/01/21 04/01/22  [name], NP  blood glucose meter kit and supplies KIT Dispense based on patient and insurance preference. Use up to four times daily as directed. (FOR ICD-9 250.00, 250.01). 10/16/19   Barb Merino, MD  Continuous Blood Gluc Receiver (FREESTYLE LIBRE 28 DAY READER) DEVI 1 each by Does not apply route as needed. 10/26/20   Azzie Glatter, FNP  Insulin Pen Needle 31G X 6 MM MISC USE AS DIRECTED 11/04/20 11/04/21  Azzie Glatter, FNP  Insulin Syringe-Needle U-100 31G X 5/16" 0.3 ML MISC USE 3 TIMES DAILY TO INJECT INSULIN 10/26/20 10/26/21  Azzie Glatter, FNP  TRUEPLUS PEN NEEDLES 31G X 6 MM MISC USE AS DIRECTED 11/04/20   Azzie Glatter, FNP    Scheduled Meds: . atorvastatin  40 mg Oral q1800  . carvedilol  25 mg Oral BID WC  . dapagliflozin propanediol  10 mg Oral QAC breakfast  . furosemide  80 mg Intravenous Once  . insulin aspart  0-20 Units Subcutaneous Q4H  . insulin glargine  20 Units Subcutaneous Daily  . pantoprazole  40 mg Oral BID  . QUEtiapine  300 mg Oral QHS   Infusions:  PRN Meds:    Allergies as of 04/13/2021  . (No Known Allergies)    Family History  Problem Relation Age of Onset  . Heart failure Mother   . Mental illness Sister   . Mental illness Sister     Social History   Socioeconomic History  . Marital status: Legally Separated    Spouse name: Not on file  . Number of children: Not on file  . Years of education: Not on file  . Highest education level: Not on file  Occupational History  . Not on file  Tobacco Use  . 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 Smokeless tobacco: Never Used  Vaping Use  . Vaping Use: Never used  Substance and  Sexual Activity  . Alcohol use: Not Currently     Alcohol/week: 2.0 standard drinks    Types: 2 Cans of beer per week  . Drug use: No  . Sexual activity: Yes  Other Topics Concern  . Not on file  Social History Narrative  . Not on file   Social Determinants of Health   Financial Resource Strain: Not on file  Food Insecurity: No Food Insecurity  . Worried About Charity fundraiser in the Last Year: Never true  . Ran Out of Food in the Last Year: Never true  Transportation Needs: Unmet Transportation Needs  . Lack of Transportation (Medical): Yes  . Lack of Transportation (Non-Medical): Yes  Physical Activity: Not on file  Stress: Not on file  Social Connections: Not on file  Intimate Partner Violence: Not on file    REVIEW OF SYSTEMS: Constitutional: Weakness, fatigue. ENT:  No nose bleeds Pulm: Not using CPAP at home as prescribed, says it is uncomfortable so he is not using it.  Shortness of breath and nonpurulent cough. CV:  No palpitations, no LE edema.  No angina GU:  No hematuria, no frequency GI: See HPI Heme: Denies excessive or unusual bleeding/bruising. Transfusions: See HPI. Neuro:  No headaches, no peripheral tingling or numbness.  No syncope, no seizures Derm:  No itching, no rash or sores.  Endocrine:  No sweats or chills.  No polyuria or dysuria Immunization: Reviewed. Travel:  None beyond local counties in last few months.    PHYSICAL EXAM: Vital signs in last 24 hours: Vitals:   04/14/21 0158 04/14/21 0542  BP: 135/83 109/65  Pulse: 70 83  Resp: 16 19  Temp: 98.4 F (36.9 C) 98.8 F (37.1 C)  SpO2: 97% (!) 83%   Wt Readings from Last 3 Encounters:  04/14/21 (!) 155.6 kg  04/01/21 (!) 152.4 kg  03/29/21 (!) 153.4 kg    General: Morbidly obese, pickwickian, chronically ill-appearing.  Somnolent but easily aroused.  Speech is hard to understand. Head: No facial asymmetry or signs of head trauma. Eyes: Conjunctiva pale.  No scleral icterus. Ears: No obvious hearing deficit Nose: Sounds  congested. Mouth: Moist, pink, clear mucosa.  Few teeth remain.  Tongue midline. Neck: No JVD, no masses, no thyromegaly Lungs: Diminished bilaterally.  Mucoid cough with wheezing.  Some dyspnea with speech. Heart: Irregularly irregular.  Telemetry with NSR and PVCs. Abdomen: Obese.  Minimally tense.  Nontender.  No HSM, bruits, hernias..   GU: No scrotal edema. Rectal: Deferred.  Specimen in lab yesterday was FOBT positive Musc/Skeltl: No joint redness, swelling or gross deformity. Extremities: Lower extremity edema. Neurologic: Appropriate.  Oriented x3.  Speech is mumbled/garbled but appropriate.  He is hard to understand.  Moves all 4 limbs.  No tremors. Skin: No suspicious lesions, rashes. Nodes: No cervical adenopathy Psych: Calm, cooperative, pleasant.  Intake/Output from previous day: 05/18 0701 - 05/19 0700 In: 1382.5 [I.V.:10; Blood:872.5; IV Piggyback:500] Out: 2000 [Urine:2000] Intake/Output this shift: No intake/output data recorded.  LAB RESULTS: Recent Labs    04/13/21 1307 04/14/21 0322  WBC 5.4 6.9  HGB 6.9* 8.2*  HCT 22.5* 27.0*  PLT 231 247   BMET Lab Results  Component Value Date   NA 139 04/14/2021   NA 141 04/13/2021   NA 139 04/13/2021   K 3.8 04/14/2021   K 3.6 04/13/2021   K 4.1 04/13/2021   CL 102 04/14/2021   CL 105 04/13/2021   CL 104 04/13/2021   CO2  31 04/14/2021   CO2 30 04/13/2021   CO2 28 04/13/2021   GLUCOSE 135 (H) 04/14/2021   GLUCOSE 75 04/13/2021   GLUCOSE 72 04/13/2021   BUN 25 (H) 04/14/2021   BUN 27 (H) 04/13/2021   BUN 28 (H) 04/13/2021   CREATININE 1.49 (H) 04/14/2021   CREATININE 1.45 (H) 04/13/2021   CREATININE 1.46 (H) 04/13/2021   CALCIUM 8.5 (L) 04/14/2021   CALCIUM 8.1 (L) 04/13/2021   CALCIUM 8.4 (L) 04/13/2021   LFT Recent Labs    04/13/21 1756  PROT 6.4*  ALBUMIN 2.9*  AST 36  ALT 36  ALKPHOS 51  BILITOT 0.7   PT/INR Lab Results  Component Value Date   INR 0.88 09/23/2011   Hepatitis  Panel No results for input(s): HEPBSAG, HCVAB, HEPAIGM, HEPBIGM in the last 72 hours. C-Diff No components found for: CDIFF Lipase  No results found for: LIPASE  Drugs of Abuse     Component Value Date/Time   LABOPIA NONE DETECTED 06/15/2020 1414   COCAINSCRNUR NONE DETECTED 06/15/2020 1414   LABBENZ NONE DETECTED 06/15/2020 1414   AMPHETMU NONE DETECTED 06/15/2020 1414   THCU NONE DETECTED 06/15/2020 1414   LABBARB NONE DETECTED 06/15/2020 1414     RADIOLOGY STUDIES: DG Chest 2 View  Result Date: 04/13/2021 CLINICAL DATA:  Shortness of breath with leg swelling. History of CHF. EXAM: CHEST - 2 VIEW COMPARISON:  None. FINDINGS: Enlarged cardiac silhouette. Single lead left subclavian approach cardiac rhythm maintenance device with tip not imaged. Pulmonary vascular congestion. Hazy bilateral perihilar and bibasilar airspace opacities. No visible pleural effusions or pneumothorax. IMPRESSION: Cardiomegaly and pulmonary vascular congestion. Hazy bilateral perihilar and bibasilar opacities may represent mild edema. Infection is a less likely differential consideration. Electronically Signed   By: Margaretha Sheffield MD   On: 04/13/2021 14:08     IMPRESSION:   *   FOBT + anemia.  Hgb better after 2 PRBCs.    Suspect blood loss from AVMs.  Previous bleeding as well as nonbleeding AVMs encountered in duodenum and stomach on SBEs in 2021  *   CHF.  Latest echocardiogram 03/2020 with LVEF 30%, grade 2 DD.  Mild pulmonary artery hypertension.  No significant valvular disorders. Weight gain, edema and CHF per x-ray.  *    Chronic Eliquis.  On hold.  Last dose a.m. 5/18  *    AKI.     *   IDDM  *  OSA.  Says he is not using CPAP at home.     PLAN:     *   Plan SPE tomorrow after 2-day Eliquis washout.  This will also allow for another 24 hours to tune up his cardiorespiratory status  *    okay to eat solid food today, I ordered heart healthy/carb modified diet.  *    continue his  Protonix 40 mg po bid as doing.     Azucena Freed  04/14/2021, 8:17 AM Phone [phone]

## 2021-04-14 ENCOUNTER — Ambulatory Visit: Payer: Self-pay | Admitting: *Deleted

## 2021-04-14 DIAGNOSIS — Z9114 Patient's other noncompliance with medication regimen: Secondary | ICD-10-CM | POA: Diagnosis not present

## 2021-04-14 DIAGNOSIS — N179 Acute kidney failure, unspecified: Secondary | ICD-10-CM | POA: Diagnosis present

## 2021-04-14 DIAGNOSIS — K289 Gastrojejunal ulcer, unspecified as acute or chronic, without hemorrhage or perforation: Secondary | ICD-10-CM | POA: Diagnosis present

## 2021-04-14 DIAGNOSIS — I130 Hypertensive heart and chronic kidney disease with heart failure and stage 1 through stage 4 chronic kidney disease, or unspecified chronic kidney disease: Secondary | ICD-10-CM | POA: Diagnosis present

## 2021-04-14 DIAGNOSIS — T502X5A Adverse effect of carbonic-anhydrase inhibitors, benzothiadiazides and other diuretics, initial encounter: Secondary | ICD-10-CM | POA: Diagnosis present

## 2021-04-14 DIAGNOSIS — R195 Other fecal abnormalities: Secondary | ICD-10-CM | POA: Diagnosis not present

## 2021-04-14 DIAGNOSIS — D500 Iron deficiency anemia secondary to blood loss (chronic): Secondary | ICD-10-CM

## 2021-04-14 DIAGNOSIS — K922 Gastrointestinal hemorrhage, unspecified: Secondary | ICD-10-CM | POA: Diagnosis not present

## 2021-04-14 DIAGNOSIS — K648 Other hemorrhoids: Secondary | ICD-10-CM | POA: Diagnosis present

## 2021-04-14 DIAGNOSIS — I480 Paroxysmal atrial fibrillation: Secondary | ICD-10-CM | POA: Diagnosis present

## 2021-04-14 DIAGNOSIS — K621 Rectal polyp: Secondary | ICD-10-CM | POA: Diagnosis present

## 2021-04-14 DIAGNOSIS — I255 Ischemic cardiomyopathy: Secondary | ICD-10-CM | POA: Diagnosis present

## 2021-04-14 DIAGNOSIS — D123 Benign neoplasm of transverse colon: Secondary | ICD-10-CM | POA: Diagnosis present

## 2021-04-14 DIAGNOSIS — K5730 Diverticulosis of large intestine without perforation or abscess without bleeding: Secondary | ICD-10-CM | POA: Diagnosis present

## 2021-04-14 DIAGNOSIS — E1122 Type 2 diabetes mellitus with diabetic chronic kidney disease: Secondary | ICD-10-CM | POA: Diagnosis present

## 2021-04-14 DIAGNOSIS — K31811 Angiodysplasia of stomach and duodenum with bleeding: Secondary | ICD-10-CM | POA: Diagnosis present

## 2021-04-14 DIAGNOSIS — I248 Other forms of acute ischemic heart disease: Secondary | ICD-10-CM | POA: Diagnosis present

## 2021-04-14 DIAGNOSIS — Z95810 Presence of automatic (implantable) cardiac defibrillator: Secondary | ICD-10-CM | POA: Diagnosis not present

## 2021-04-14 DIAGNOSIS — G4733 Obstructive sleep apnea (adult) (pediatric): Secondary | ICD-10-CM | POA: Diagnosis present

## 2021-04-14 DIAGNOSIS — D122 Benign neoplasm of ascending colon: Secondary | ICD-10-CM | POA: Diagnosis present

## 2021-04-14 DIAGNOSIS — I5023 Acute on chronic systolic (congestive) heart failure: Secondary | ICD-10-CM | POA: Diagnosis present

## 2021-04-14 DIAGNOSIS — D124 Benign neoplasm of descending colon: Secondary | ICD-10-CM | POA: Diagnosis present

## 2021-04-14 DIAGNOSIS — Z20822 Contact with and (suspected) exposure to covid-19: Secondary | ICD-10-CM | POA: Diagnosis present

## 2021-04-14 DIAGNOSIS — Z6841 Body Mass Index (BMI) 40.0 and over, adult: Secondary | ICD-10-CM | POA: Diagnosis not present

## 2021-04-14 DIAGNOSIS — R0602 Shortness of breath: Secondary | ICD-10-CM | POA: Diagnosis present

## 2021-04-14 DIAGNOSIS — Z5320 Procedure and treatment not carried out because of patient's decision for unspecified reasons: Secondary | ICD-10-CM | POA: Diagnosis present

## 2021-04-14 DIAGNOSIS — N1831 Chronic kidney disease, stage 3a: Secondary | ICD-10-CM | POA: Diagnosis present

## 2021-04-14 LAB — CBC
HCT: 27 % — ABNORMAL LOW (ref 39.0–52.0)
Hemoglobin: 8.2 g/dL — ABNORMAL LOW (ref 13.0–17.0)
MCH: 27 pg (ref 26.0–34.0)
MCHC: 30.4 g/dL (ref 30.0–36.0)
MCV: 88.8 fL (ref 80.0–100.0)
Platelets: 247 10*3/uL (ref 150–400)
RBC: 3.04 MIL/uL — ABNORMAL LOW (ref 4.22–5.81)
RDW: 15.6 % — ABNORMAL HIGH (ref 11.5–15.5)
WBC: 6.9 10*3/uL (ref 4.0–10.5)
nRBC: 0.3 % — ABNORMAL HIGH (ref 0.0–0.2)

## 2021-04-14 LAB — GLUCOSE, CAPILLARY
Glucose-Capillary: 138 mg/dL — ABNORMAL HIGH (ref 70–99)
Glucose-Capillary: 139 mg/dL — ABNORMAL HIGH (ref 70–99)
Glucose-Capillary: 139 mg/dL — ABNORMAL HIGH (ref 70–99)
Glucose-Capillary: 139 mg/dL — ABNORMAL HIGH (ref 70–99)
Glucose-Capillary: 179 mg/dL — ABNORMAL HIGH (ref 70–99)
Glucose-Capillary: 233 mg/dL — ABNORMAL HIGH (ref 70–99)

## 2021-04-14 LAB — TYPE AND SCREEN
ABO/RH(D): B POS
Antibody Screen: NEGATIVE
Unit division: 0
Unit division: 0

## 2021-04-14 LAB — BPAM RBC
Blood Product Expiration Date: 202205262359
Blood Product Expiration Date: 202206092359
ISSUE DATE / TIME: 202205182034
ISSUE DATE / TIME: 202205182336
Unit Type and Rh: 7300
Unit Type and Rh: 7300

## 2021-04-14 LAB — FERRITIN: Ferritin: 11 ng/mL — ABNORMAL LOW (ref 24–336)

## 2021-04-14 LAB — BASIC METABOLIC PANEL
Anion gap: 6 (ref 5–15)
BUN: 25 mg/dL — ABNORMAL HIGH (ref 6–20)
CO2: 31 mmol/L (ref 22–32)
Calcium: 8.5 mg/dL — ABNORMAL LOW (ref 8.9–10.3)
Chloride: 102 mmol/L (ref 98–111)
Creatinine, Ser: 1.49 mg/dL — ABNORMAL HIGH (ref 0.61–1.24)
GFR, Estimated: 53 mL/min — ABNORMAL LOW (ref >60)
Glucose, Bld: 135 mg/dL — ABNORMAL HIGH (ref 70–99)
Potassium: 3.8 mmol/L (ref 3.5–5.1)
Sodium: 139 mmol/L (ref 135–145)

## 2021-04-14 LAB — MAGNESIUM: Magnesium: 2.2 mg/dL (ref 1.7–2.4)

## 2021-04-14 LAB — IRON AND TIBC
Iron: 25 ug/dL — ABNORMAL LOW (ref 45–182)
Saturation Ratios: 6 % — ABNORMAL LOW (ref 17.9–39.5)
TIBC: 406 ug/dL (ref 250–450)
UIBC: 381 ug/dL

## 2021-04-14 MED ORDER — POTASSIUM CHLORIDE CRYS ER 20 MEQ PO TBCR
20.0000 meq | EXTENDED_RELEASE_TABLET | Freq: Once | ORAL | Status: AC
  Administered 2021-04-14: 20 meq via ORAL
  Filled 2021-04-14: qty 1

## 2021-04-14 MED ORDER — FUROSEMIDE 10 MG/ML IJ SOLN
80.0000 mg | Freq: Two times a day (BID) | INTRAMUSCULAR | Status: DC
  Administered 2021-04-15 – 2021-04-17 (×5): 80 mg via INTRAVENOUS
  Filled 2021-04-14 (×6): qty 8

## 2021-04-14 MED ORDER — FUROSEMIDE 10 MG/ML IJ SOLN
80.0000 mg | Freq: Once | INTRAMUSCULAR | Status: AC
  Administered 2021-04-14: 80 mg via INTRAVENOUS
  Filled 2021-04-14: qty 8

## 2021-04-14 NOTE — Progress Notes (Addendum)
Subjective: Patient states he is he feeling better today. He was able to sleep okay overnight. Reports his shortness of breath has improved and chest pain has decreased today. His legs feel lighter than they did yesterday. Urinating without difficulty. Has not had any bloody bowel movements and no frank bleeding. Discussed endoscopy scheduled for tomorrow and patient is understanding and in agreement.   Objective:  Vital signs in last 24 hours: Vitals:   04/14/21 0007 04/14/21 0158 04/14/21 0400 04/14/21 0542  BP: 136/80 135/83  109/65  Pulse: 60 70  83  Resp: 16 16  19   Temp: 98.4 F (36.9 C) 98.4 F (36.9 C)  98.8 F (37.1 C)  TempSrc:  Oral    SpO2:  97%  (!) 83%  Weight:   (!) 155.6 kg    Weight change:   Intake/Output Summary (Last 24 hours) at 04/14/2021 1309 Last data filed at 04/14/2021 0900 Gross per 24 hour  Intake 1742.5 ml  Output 3150 ml  Net -1407.5 ml   CBC    Component Value Date/Time   WBC 6.9 04/14/2021 0322   RBC 3.04 (L) 04/14/2021 0322   HGB 8.2 (L) 04/14/2021 0322   HGB 11.9 (L) 09/01/2020 1020   HCT 27.0 (L) 04/14/2021 0322   HCT 38.1 09/01/2020 1020   PLT 247 04/14/2021 0322   PLT 250 09/01/2020 1020   MCV 88.8 04/14/2021 0322   MCV 82 09/01/2020 1020   MCH 27.0 04/14/2021 0322   MCHC 30.4 04/14/2021 0322   RDW 15.6 (H) 04/14/2021 0322   RDW 16.3 (H) 09/01/2020 1020   LYMPHSABS 1.1 04/13/2021 1307   LYMPHSABS 1.2 09/01/2020 1020   MONOABS 0.7 04/13/2021 1307   EOSABS 0.1 04/13/2021 1307   EOSABS 0.2 09/01/2020 1020   BASOSABS 0.0 04/13/2021 1307   BASOSABS 0.0 09/01/2020 1020   BMP Latest Ref Rng & Units 04/14/2021 04/13/2021 04/13/2021  Glucose 70 - 99 mg/dL 135(H) 75 72  BUN 6 - 20 mg/dL 25(H) 27(H) 28(H)  Creatinine 0.61 - 1.24 mg/dL 1.49(H) 1.45(H) 1.46(H)  BUN/Creat Ratio 10 - 24 - - -  Sodium 135 - 145 mmol/L 139 141 139  Potassium 3.5 - 5.1 mmol/L 3.8 3.6 4.1  Chloride 98 - 111 mmol/L 102 105 104  CO2 22 - 32 mmol/L 31 30 28    Calcium 8.9 - 10.3 mg/dL 8.5(L) 8.1(L) 8.4(L)   Assessment/Plan:  Active Problems:   CHF exacerbation (HCC)   GI bleeding  #Acute on chronic HFrEF (EF 30%) exacerbation  HFrEF exacerbation likely in the setting of increased salt intake. Overnight, output of 2 liters with IV Lasix 80 mg and another 1.15 L out today with IV Lasix 80 mg once this AM. Shortness of breath has improved. Physical exam remains significant for lower extremity pitting edema and weight today is 343 lbs and in 01/2021 weight was 321 lbs.   Additionally, will check iron studies to determine whether he is an appropriate candidate for IV iron given HF exacerbation.   - Continue diuresis with IV Lasix 80 mg BID  - Trend BMP - Cr 1.49 (5/19)  - Follow up iron studies  - Strict I&Os - Daily Weights - Replenish K as needed >4.0 and Mg >2 as needed  - Continue Farxiga 10 mg daily  - Continue Coreg 25 mg twice daily   #GI bleed  #Anemia  Patient has a history of upper GI bleed in 05/2020 and is s/p APC of multiple angiodysplastic lesions in the  second and third portion of the duodenum. Two weeks of bloody stools, most likely due to GI bleed.   Will undergo upper enteroscopy with GI tomorrow most likely at noon (5/20).   -Hemoglobin up 8.2 from 6.9 s/p 2 units pRBCs  -Trend CBC  -GI consulted, appreciate evaluation  -NPO at midnight for upper enteroscopy  -Continue home Protonix 40 mg twice daily  -Hold Eliquis and home blood pressure medications   #Ischemic Cardiomyopathy with ICD placement  #Elevated troponin (resolved)  Patient reports "light" central, non-radiating chest pain for the past two weeks after lifting a heavy jack hammer. EKG was unremarkable for new changes and trops were flat (198 >200) low concern for ACS. Elevated troponin most likely due to demand ischemia. Chest pain has improved today.   -Continue Atorvastatin 40 mg daily  -Hold blood pressure medications given GI bleed  -Continue to  monitor symptoms and physical exam  -Cardiac monitoring   #Diabetes Mellitus  Last measured A1c 7.1% 03/2021. Home regimen includes metformin 500 mg twice daily, Farxiga 10 mg daily, glipizide 10 mg twice daily, Lantus 20 units daily, and Humalog 30 units twice daily.   -Continue Farxiga 10 mg daily  -Hold metformin and glipizide -Lantus 20 units daily  -resistant SSI for correctional insulin   #Paroxysmal atrial fibrillation  Most recent burden of atrial fibrillation of 2.1% and anticoagulated on Eliquis 5 mg twice daily.   - Hold Eliquis given GI bleed - Continue Coreg 25 mg twice daily   #CKD Stage IIIa #AKI  Creatinine 1.46, which is up from 1.32 (04/08/2021). Baseline seems to fluctuate between 1.1-1.2.   -Continue to trend BMP   #Obstructive Sleep Apnea  Patient states he sometimes he uses CPAP machine at night. Will order CPAP for him here in the hospital.   -CPAP nightly if available   #Hypertension  Home blood pressure medications include hydralazine 25 mg (1.5 tablets) three times daily, Entresto 97-103 twice daily, spironolactone 25 mg daily, and Coreg 25 mg twice daily.   -Continue Coreg 25 mg twice daily  -Hold all other BP meds due to GI bleed, higher risk for hypotension from blood loss  Dispo: Admit patient to  Internal Medicine    LOS: 0 days   Jacqlyn Larsen, Medical Student 04/14/2021, 2:07 PM  Pager: [phone] After 5pm on weekdays and 1pm on weekends: On Call pager: [phone]

## 2021-04-14 NOTE — Progress Notes (Signed)
Patient refused CPAP.

## 2021-04-15 ENCOUNTER — Encounter (HOSPITAL_COMMUNITY): Payer: Self-pay | Admitting: Internal Medicine

## 2021-04-15 ENCOUNTER — Encounter (HOSPITAL_COMMUNITY)
Admission: EM | Disposition: A | Discharge status: Home / Self Care | Payer: Self-pay | Source: Home / Self Care | Attending: Internal Medicine

## 2021-04-15 ENCOUNTER — Inpatient Hospital Stay (HOSPITAL_COMMUNITY): Payer: Medicare (Managed Care) | Admitting: Certified Registered Nurse Anesthetist

## 2021-04-15 DIAGNOSIS — K31811 Angiodysplasia of stomach and duodenum with bleeding: Secondary | ICD-10-CM | POA: Diagnosis not present

## 2021-04-15 HISTORY — PX: ENTEROSCOPY: SHX5533

## 2021-04-15 HISTORY — PX: HOT HEMOSTASIS: SHX5433

## 2021-04-15 LAB — BASIC METABOLIC PANEL
Anion gap: 7 (ref 5–15)
BUN: 21 mg/dL — ABNORMAL HIGH (ref 6–20)
CO2: 30 mmol/L (ref 22–32)
Calcium: 8.3 mg/dL — ABNORMAL LOW (ref 8.9–10.3)
Chloride: 102 mmol/L (ref 98–111)
Creatinine, Ser: 1.36 mg/dL — ABNORMAL HIGH (ref 0.61–1.24)
GFR, Estimated: 60 mL/min — ABNORMAL LOW (ref >60)
Glucose, Bld: 128 mg/dL — ABNORMAL HIGH (ref 70–99)
Potassium: 3.5 mmol/L (ref 3.5–5.1)
Sodium: 139 mmol/L (ref 135–145)

## 2021-04-15 LAB — GLUCOSE, CAPILLARY
Glucose-Capillary: 170 mg/dL — ABNORMAL HIGH (ref 70–99)
Glucose-Capillary: 128 mg/dL — ABNORMAL HIGH (ref 70–99)
Glucose-Capillary: 118 mg/dL — ABNORMAL HIGH (ref 70–99)
Glucose-Capillary: 180 mg/dL — ABNORMAL HIGH (ref 70–99)
Glucose-Capillary: 163 mg/dL — ABNORMAL HIGH (ref 70–99)

## 2021-04-15 LAB — CBC
HCT: 26.4 % — ABNORMAL LOW (ref 39.0–52.0)
Hemoglobin: 8.3 g/dL — ABNORMAL LOW (ref 13.0–17.0)
MCH: 27.9 pg (ref 26.0–34.0)
MCHC: 31.4 g/dL (ref 30.0–36.0)
MCV: 88.9 fL (ref 80.0–100.0)
Platelets: 240 10*3/uL (ref 150–400)
RBC: 2.97 MIL/uL — ABNORMAL LOW (ref 4.22–5.81)
RDW: 15.5 % (ref 11.5–15.5)
WBC: 5.8 10*3/uL (ref 4.0–10.5)
nRBC: 0.3 % — ABNORMAL HIGH (ref 0.0–0.2)

## 2021-04-15 SURGERY — ENTEROSCOPY
Anesthesia: Monitor Anesthesia Care

## 2021-04-15 MED ORDER — PEG-KCL-NACL-NASULF-NA ASC-C 100 G PO SOLR
0.5000 | Freq: Once | ORAL | Status: AC
  Administered 2021-04-15: 100 g via ORAL
  Filled 2021-04-15: qty 1

## 2021-04-15 MED ORDER — SODIUM CHLORIDE 0.9 % IV SOLN
510.0000 mg | Freq: Once | INTRAVENOUS | Status: AC
  Administered 2021-04-15: 510 mg via INTRAVENOUS
  Filled 2021-04-15: qty 17

## 2021-04-15 MED ORDER — LIDOCAINE 2% (20 MG/ML) 5 ML SYRINGE
INTRAMUSCULAR | Status: DC | PRN
  Administered 2021-04-15: 100 mg via INTRAVENOUS

## 2021-04-15 MED ORDER — POTASSIUM CHLORIDE CRYS ER 20 MEQ PO TBCR
40.0000 meq | EXTENDED_RELEASE_TABLET | Freq: Two times a day (BID) | ORAL | Status: DC
  Administered 2021-04-15 – 2021-04-19 (×8): 40 meq via ORAL
  Filled 2021-04-15 (×9): qty 2

## 2021-04-15 MED ORDER — PANTOPRAZOLE SODIUM 40 MG PO TBEC
40.0000 mg | DELAYED_RELEASE_TABLET | Freq: Every day | ORAL | Status: DC
  Administered 2021-04-17 – 2021-04-19 (×3): 40 mg via ORAL
  Filled 2021-04-15 (×4): qty 1

## 2021-04-15 MED ORDER — BISACODYL 5 MG PO TBEC
20.0000 mg | DELAYED_RELEASE_TABLET | Freq: Once | ORAL | Status: AC
  Administered 2021-04-15: 20 mg via ORAL
  Filled 2021-04-15 (×2): qty 4

## 2021-04-15 MED ORDER — METOCLOPRAMIDE HCL 5 MG/ML IJ SOLN
10.0000 mg | Freq: Once | INTRAMUSCULAR | Status: AC
  Administered 2021-04-15: 10 mg via INTRAVENOUS
  Filled 2021-04-15: qty 2

## 2021-04-15 MED ORDER — PEG-KCL-NACL-NASULF-NA ASC-C 100 G PO SOLR: 1.0000 | Freq: Once | ORAL | Status: DC

## 2021-04-15 MED ORDER — LACTATED RINGERS IV SOLN: INTRAVENOUS | Status: DC

## 2021-04-15 MED ORDER — PROPOFOL 500 MG/50ML IV EMUL
INTRAVENOUS | Status: DC | PRN
  Administered 2021-04-15: 75 ug/kg/min via INTRAVENOUS

## 2021-04-15 MED ORDER — PROPOFOL 10 MG/ML IV BOLUS
INTRAVENOUS | Status: DC | PRN
  Administered 2021-04-15 (×4): 20 mg via INTRAVENOUS

## 2021-04-15 NOTE — Progress Notes (Signed)
Subjective: Patient refused CPAP overnight. Feeling good today. Chest pain has improved, but chest continues to feel "cloudy." Upon clarification, patient states that it is sometimes difficult to get a good breath in and out. Reports he often sees star if he coughs too hard and continues to endorse some dizziness. Feels like leg swelling has improved.   Has been living in a hotel for 2 weeks because he sister got married. She will move out to a new house and he will return to his apartment they previously shared when he is discharged from the hospital.   Objective:  Vital signs in last 24 hours: Vitals:   04/14/21 0848 04/14/21 1318 04/14/21 2009 04/15/21 0415  BP: 132/69 (!) 128/59 (!) 109/51 103/61  Pulse: 73 68 (!) 55 63  Resp: 18 20 19 19   Temp:  99.1 F (37.3 C) 99.1 F (37.3 C) 98 F (36.7 C)  TempSrc:  Oral Oral   SpO2: 96% 97% 95% (!) 88%  Weight:       Weight change:   Intake/Output Summary (Last 24 hours) at 04/15/2021 1229 Last data filed at 04/15/2021 0730 Gross per 24 hour  Intake 480 ml  Output 1750 ml  Net -1270 ml   Physical Exam Constitutional:      Appearance: Normal appearance.     Comments: Sitting comfortably on bench in room.   Cardiovascular:     Comments: Normal rate, irregular rhythm. Radial pulses 2+ bilaterally.  Pulmonary:     Effort: Pulmonary effort is normal.     Comments: Soft rhonchi heard at the bases of lung fields. Upper and middle lung fields clear.  Abdominal:     General: There is distension.  Musculoskeletal:     Right lower leg: Edema present.     Left lower leg: Edema present.     Comments: 1+ pitting pre tibial pitting edema bilaterally. Skin is less tight on feet and legs.   Skin:    General: Skin is warm and dry.  Neurological:     General: No focal deficit present.     Mental Status: He is alert. Mental status is at baseline.  Psychiatric:        Mood and Affect: Mood normal.        Behavior: Behavior normal.     CBC    Component Value Date/Time   WBC 5.8 04/15/2021 0134   RBC 2.97 (L) 04/15/2021 0134   HGB 8.3 (L) 04/15/2021 0134   HGB 11.9 (L) 09/01/2020 1020   HCT 26.4 (L) 04/15/2021 0134   HCT 38.1 09/01/2020 1020   PLT 240 04/15/2021 0134   PLT 250 09/01/2020 1020   MCV 88.9 04/15/2021 0134   MCV 82 09/01/2020 1020   MCH 27.9 04/15/2021 0134   MCHC 31.4 04/15/2021 0134   RDW 15.5 04/15/2021 0134   RDW 16.3 (H) 09/01/2020 1020   LYMPHSABS 1.1 04/13/2021 1307   LYMPHSABS 1.2 09/01/2020 1020   MONOABS 0.7 04/13/2021 1307   EOSABS 0.1 04/13/2021 1307   EOSABS 0.2 09/01/2020 1020   BASOSABS 0.0 04/13/2021 1307   BASOSABS 0.0 09/01/2020 1020   BMP Latest Ref Rng & Units 04/15/2021 04/14/2021 04/13/2021  Glucose 70 - 99 mg/dL 128(H) 135(H) 75  BUN 6 - 20 mg/dL 21(H) 25(H) 27(H)  Creatinine 0.61 - 1.24 mg/dL 1.36(H) 1.49(H) 1.45(H)  BUN/Creat Ratio 10 - 24 - - -  Sodium 135 - 145 mmol/L 139 139 141  Potassium 3.5 - 5.1 mmol/L 3.5 3.8 3.6  Chloride 98 - 111 mmol/L 102 102 105  CO2 22 - 32 mmol/L 30 31 30   Calcium 8.9 - 10.3 mg/dL 8.3(L) 8.5(L) 8.1(L)   Assessment/Plan:  Active Problems:   CHF exacerbation (HCC)   GI bleeding  #Acute on chronic HFrEF (EF 30%) exacerbation  HFrEF exacerbation likely in the setting of increased salt intake. 2.5 L of output on 5/19. Weight today is stable at 343 lbs and in 01/2021 weight was 321 lbs.   Iron studies show iron of 25 and ferritin of 11, consistent with iron deficiency anemia. Will proceed with iron transfusion given these findings.   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 Two weeks of bloody stools, most likely due to GI bleed. S/p 2 units pRBCs on 5/18.   Enteroscopy showed 2 very small angioectasias with no bleeding were found in the third portion of the duodenum. Coagulated with APC.   -Will undergo upper enteroscopy with GI 5/20  -CLD after enteroscopy  -Colonoscopy planned for 5/21 -Iron transfusion after enteroscopy  -Hemoglobin stable at 8.3  -Trend CBC  -GI consulted, appreciate evaluation  -Continue home Protonix 40 mg twice daily  -Hold Eliquis and home blood pressure medications   #Ischemic Cardiomyopathy with ICD placement  #Elevated troponin (resolved)  Patient reports "light" central, non-radiating chest pain for the past two weeks after lifting a heavy jack hammer. EKG was unremarkable for new changes and trops were flat (198 >200) low concern for ACS. Elevated troponin most likely due to demand ischemia.   -Continue Atorvastatin 40 mg daily  -Hold blood pressure medications given GI bleed  -Continue to monitor symptoms and physical exam  -Cardiac monitoring   #Diabetes Mellitus  Last measured A1c 7.1% 03/2021. Home regimen includes metformin 500 mg twice daily, Farxiga 10 mg daily, glipizide 10 mg twice daily, Lantus 20 units daily, and Humalog 30 units twice daily.   -Continue Farxiga 10 mg daily  -Hold metformin and glipizide -Lantus 20 units daily  -resistant SSI for correctional insulin   #Paroxysmal atrial fibrillation  Most recent burden of atrial fibrillation of 2.1% and anticoagulated on Eliquis 5 mg twice daily.   - Hold Eliquis given GI bleed - Continue Coreg 25 mg twice daily   #CKD Stage IIIa #AKI  Creatinine 1.36, which has improved from Cr of 1.49 (5/20).  Baseline seems to fluctuate between 1.1-1.2.   -Continue to trend BMP   #Obstructive Sleep Apnea  Patient refused CPAP overnight. Will continue to encourage use.   -CPAP nightly if available    #Hypertension  Home blood pressure medications include hydralazine 25 mg (1.5 tablets) three times daily, Entresto 97-103 twice daily, spironolactone 25 mg daily, and Coreg 25 mg twice daily.   -Continue Coreg 25 mg twice daily  -Hold all other BP meds due to GI bleed, higher risk for hypotension from blood loss  Dispo: Admit patient to Internal Medicine    LOS: 1 day   Jacqlyn Larsen, Medical Student 04/15/2021, 6:16 AM  Pager: [phone] After 5pm on weekdays and 1pm on weekends: On Call pager: [phone]

## 2021-04-15 NOTE — Op Note (Addendum)
[facility] Patient Name: [name] Procedure Date : 04/15/2021 MRN: [id number] Attending MD: [name] MD, MD Date of Birth: [birth year] CSN: [id number] Age: 60 Admit Type: Inpatient Procedure:                Small bowel enteroscopy Indications:              Obscure gastrointestinal bleeding                           History of bleeding duodenal AVMs treated with APC                            x 2 in 2021                           Last colonoscopy 2017 at [facility] revealed a tubular                            adenoma Providers:                [name] MD, MD, Vista Lawman, RN, Cherylynn Ridges, Technician, Dellie Catholic, CRNA Referring MD:              Medicines:                Monitored Anesthesia Care Complications:            No immediate complications. Estimated Blood Loss:     Estimated blood loss: none. Procedure:                Pre-Anesthesia Assessment:                           - Prior to the procedure, a History and Physical                            was performed, and patient medications and                            allergies were reviewed. The patient's tolerance of                            previous anesthesia was also reviewed. The risks                            and benefits of the procedure and the sedation                            options and risks were discussed with the patient.                            All questions were answered, and informed consent                            was obtained. Prior Anticoagulants: The  patient has                            taken Eliquis (apixaban), last dose was 2 days                            prior to procedure. ASA Grade Assessment: III - A                            patient with severe systemic disease. After                            reviewing the risks and benefits, the patient was                            deemed in satisfactory condition to undergo the                             procedure.                           After obtaining informed consent, the endoscope was                            passed under direct vision. Throughout the                            procedure, the patient's blood pressure, pulse, and                            oxygen saturations were monitored continuously. The                            PCF-H190DL [id number] Olympus pediatric colonoscope                            was introduced through the mouth and advanced to                            the jejunum, to the 170 cm mark (from the                            incisors). The small bowel enteroscopy was                            accomplished without difficulty. The patient                            tolerated the procedure well. Scope In: Scope Out: Findings:      The esophagus was normal.      The stomach was normal.      Two very small, non-bleeding angioectasias with no bleeding were found       in the third portion of the duodenum. Coagulation for bleeding       prevention using argon plasma  at 2 liters/minute and 20 watts was       successful. Estimated blood loss: none.      Scope trauma occurred in the duodenal sweep as we repeatedly evaluated       this area looking for AVM. (See photo). There was no evidence of       significant pathology in the entire examined portion of jejunum. No       evidence for active or recent bleeding. Impression:               - Two non-bleeding angioectasias in the duodenum.                            Treated with argon plasma coagulation (APC). Recommendation:           - Return patient to hospital ward for ongoing care.                           - Clear liquid diet today                           - Prep for colonoscopy tomorrow to be sure that                            there is not additional pathology contributing                           - Serial hgb/hct with transfusion as indicated                           -  Continue to hold Eliquis in preparation for                            colonoscopy Procedure Code(s):        --- Professional ---                           [id number], Small intestinal endoscopy, enteroscopy                            beyond second portion of duodenum, not including                            ileum; with control of bleeding (eg, injection,                            bipolar cautery, unipolar cautery, laser, heater                            probe, stapler, plasma coagulator) Diagnosis Code(s):        --- Professional ---                           T51.761, Angiodysplasia of stomach and duodenum                            without bleeding  K92.2, Gastrointestinal hemorrhage, unspecified CPT copyright 2019 American [employer]. All rights reserved. The codes documented in this report are preliminary and upon coder review may  be revised to meet current compliance requirements. [name] MD, MD 04/15/2021 12:58:01 PM This report has been signed electronically. Number of Addenda: 0

## 2021-04-15 NOTE — Anesthesia Procedure Notes (Signed)
Procedure Name: MAC Date/Time: 04/15/2021 12:12 PM Performed by: Candis Shine, CRNA Pre-anesthesia Checklist: Patient identified, Emergency Drugs available, Suction available, Patient being monitored and Timeout performed Patient Re-evaluated:Patient Re-evaluated prior to induction Oxygen Delivery Method: Nasal cannula Dental Injury: Teeth and Oropharynx as per pre-operative assessment

## 2021-04-15 NOTE — Interval H&P Note (Signed)
History and Physical Interval Note:  04/15/2021 11:59 AM  [name]  has presented today for surgery, with the diagnosis of FOBT positive dark stools.  Blood loss anemia.  History gastric and duodenal AVMs treated in past..  The various methods of treatment have been discussed with the patient and family. After consideration of risks, benefits and other options for treatment, the patient has consented to  Procedure(s): ENTEROSCOPY (N/A) as a surgical intervention.  The patient's history has been reviewed, patient examined, no change in status, stable for surgery.  I have reviewed the patient's chart and labs.  Questions were answered to the patient's satisfaction.     [name]

## 2021-04-15 NOTE — Anesthesia Preprocedure Evaluation (Addendum)
Anesthesia Evaluation  Patient identified by MRN, date of birth, ID band Patient awake    Reviewed: Allergy & Precautions, H&P , NPO status , Patient's Chart, lab work & pertinent test results  History of Anesthesia Complications Negative for: history of anesthetic complications  Airway Mallampati: III  TM Distance: >3 FB Neck ROM: Full    Dental  (+) Poor Dentition, Loose, Missing   Pulmonary sleep apnea and Continuous Positive Airway Pressure Ventilation , former smoker,    Pulmonary exam normal        Cardiovascular hypertension, + CAD, + Cardiac Stents and +CHF  Normal cardiovascular exam+ dysrhythmias Atrial Fibrillation      Neuro/Psych Anxiety Depression Schizophrenia negative neurological ROS     GI/Hepatic Neg liver ROS, Gastric AVMs   Endo/Other  diabetes, Type 2Morbid obesity  Renal/GU Renal Insufficiency and ARFRenal disease  negative genitourinary   Musculoskeletal negative musculoskeletal ROS (+)   Abdominal   Peds  Hematology  (+) Blood dyscrasia, anemia ,   Anesthesia Other Findings  Echo 04/05/20: EF 30%, global hypokinesis, mild LVH, FI4PP, mild RV systolic dysfunction, RVSP 37.7, normal valves  Reproductive/Obstetrics                            Lab Results  Component Value Date   WBC 5.8 04/15/2021   HGB 8.3 (L) 04/15/2021   HCT 26.4 (L) 04/15/2021   MCV 88.9 04/15/2021   PLT 240 04/15/2021   Lab Results  Component Value Date   CREATININE 1.36 (H) 04/15/2021   BUN 21 (H) 04/15/2021   NA 139 04/15/2021   K 3.5 04/15/2021   CL 102 04/15/2021   CO2 30 04/15/2021    Anesthesia Physical  Anesthesia Plan  ASA: IV  Anesthesia Plan: MAC   Post-op Pain Management:    Induction: Intravenous  PONV Risk Score and Plan: 1 and Propofol infusion, TIVA and Treatment may vary due to age or medical condition  Airway Management Planned: Natural Airway and Nasal  Cannula  Additional Equipment: None  Intra-op Plan:   Post-operative Plan:   Informed Consent: I have reviewed the patients History and Physical, chart, labs and discussed the procedure including the risks, benefits and alternatives for the proposed anesthesia with the patient or authorized representative who has indicated his/her understanding and acceptance.     Dental advisory given  Plan Discussed with: CRNA, Anesthesiologist and Surgeon  Anesthesia Plan Comments:        Anesthesia Quick Evaluation

## 2021-04-15 NOTE — Transfer of Care (Signed)
Immediate Anesthesia Transfer of Care Note  Patient: [name]  Procedure(s) Performed: ENTEROSCOPY (N/A ) HOT HEMOSTASIS (ARGON PLASMA COAGULATION/BICAP) (N/A )  Patient Location: Endoscopy Unit  Anesthesia Type:MAC  Level of Consciousness: drowsy  Airway & Oxygen Therapy: Patient Spontanous Breathing and Patient connected to face mask oxygen  Post-op Assessment: Report given to RN and Post -op Vital signs reviewed and stable  Post vital signs: Reviewed and stable  Last Vitals:  Vitals Value Taken Time  BP 152/71 04/15/21 1259  Temp    Pulse 91 04/15/21 1302  Resp 19 04/15/21 1302  SpO2 95 % 04/15/21 1302  Vitals shown include unvalidated device data.  Last Pain:  Vitals:   04/15/21 1152  TempSrc:   PainSc: 0-No pain         Complications: No complications documented.

## 2021-04-16 ENCOUNTER — Inpatient Hospital Stay (HOSPITAL_COMMUNITY): Payer: Medicare (Managed Care) | Admitting: Certified Registered Nurse Anesthetist

## 2021-04-16 ENCOUNTER — Encounter (HOSPITAL_COMMUNITY)
Admission: EM | Disposition: A | Discharge status: Home / Self Care | Payer: Self-pay | Source: Home / Self Care | Attending: Internal Medicine

## 2021-04-16 ENCOUNTER — Encounter (HOSPITAL_COMMUNITY): Payer: Self-pay | Admitting: Internal Medicine

## 2021-04-16 DIAGNOSIS — K621 Rectal polyp: Secondary | ICD-10-CM | POA: Diagnosis not present

## 2021-04-16 DIAGNOSIS — D122 Benign neoplasm of ascending colon: Secondary | ICD-10-CM

## 2021-04-16 HISTORY — PX: POLYPECTOMY: SHX5525

## 2021-04-16 HISTORY — PX: COLONOSCOPY WITH PROPOFOL: SHX5780

## 2021-04-16 HISTORY — PX: GIVENS CAPSULE STUDY: SHX5432

## 2021-04-16 LAB — BASIC METABOLIC PANEL
Anion gap: 6 (ref 5–15)
BUN: 13 mg/dL (ref 6–20)
CO2: 29 mmol/L (ref 22–32)
Calcium: 8.5 mg/dL — ABNORMAL LOW (ref 8.9–10.3)
Chloride: 105 mmol/L (ref 98–111)
Creatinine, Ser: 1.18 mg/dL (ref 0.61–1.24)
GFR, Estimated: >60 mL/min (ref >60)
Glucose, Bld: 109 mg/dL — ABNORMAL HIGH (ref 70–99)
Potassium: 3.7 mmol/L (ref 3.5–5.1)
Sodium: 140 mmol/L (ref 135–145)

## 2021-04-16 LAB — CBC
HCT: 26.9 % — ABNORMAL LOW (ref 39.0–52.0)
Hemoglobin: 8.3 g/dL — ABNORMAL LOW (ref 13.0–17.0)
MCH: 27.3 pg (ref 26.0–34.0)
MCHC: 30.9 g/dL (ref 30.0–36.0)
MCV: 88.5 fL (ref 80.0–100.0)
Platelets: 256 10*3/uL (ref 150–400)
RBC: 3.04 MIL/uL — ABNORMAL LOW (ref 4.22–5.81)
RDW: 15.5 % (ref 11.5–15.5)
WBC: 5.9 10*3/uL (ref 4.0–10.5)
nRBC: 0 % (ref 0.0–0.2)

## 2021-04-16 LAB — GLUCOSE, CAPILLARY
Glucose-Capillary: 102 mg/dL — ABNORMAL HIGH (ref 70–99)
Glucose-Capillary: 103 mg/dL — ABNORMAL HIGH (ref 70–99)
Glucose-Capillary: 106 mg/dL — ABNORMAL HIGH (ref 70–99)
Glucose-Capillary: 155 mg/dL — ABNORMAL HIGH (ref 70–99)
Glucose-Capillary: 181 mg/dL — ABNORMAL HIGH (ref 70–99)
Glucose-Capillary: 99 mg/dL (ref 70–99)

## 2021-04-16 SURGERY — IMAGING PROCEDURE, GI TRACT, INTRALUMINAL, VIA CAPSULE

## 2021-04-16 SURGERY — COLONOSCOPY WITH PROPOFOL
Anesthesia: Monitor Anesthesia Care

## 2021-04-16 MED ORDER — ALBUTEROL SULFATE HFA 108 (90 BASE) MCG/ACT IN AERS
INHALATION_SPRAY | RESPIRATORY_TRACT | Status: DC | PRN
  Administered 2021-04-16: 2 via RESPIRATORY_TRACT

## 2021-04-16 MED ORDER — PROPOFOL 10 MG/ML IV BOLUS
INTRAVENOUS | Status: DC | PRN
  Administered 2021-04-16 (×2): 20 mg via INTRAVENOUS

## 2021-04-16 MED ORDER — PROPOFOL 500 MG/50ML IV EMUL
INTRAVENOUS | Status: DC | PRN
  Administered 2021-04-16: 100 ug/kg/min via INTRAVENOUS

## 2021-04-16 MED ORDER — LACTATED RINGERS IV SOLN
INTRAVENOUS | Status: AC | PRN
  Administered 2021-04-16: 20 mL/h via INTRAVENOUS

## 2021-04-16 SURGICAL SUPPLY — 21 items

## 2021-04-16 NOTE — Plan of Care (Signed)

## 2021-04-16 NOTE — Op Note (Signed)
[facility] Patient Name: [name] Procedure Date : 04/16/2021 MRN: [id number] Attending MD: [name] MD, MD Date of Birth: 1961/01/11 CSN: [id number] Age: 60 Admit Type: Inpatient Procedure:                Colonoscopy Indications:              Melena not explained by small bowel enteroscopy                            04/15/21 Providers:                [name] MD, MD, Grace Isaac, RN, Lesia Sago, Technician, Clearnce Sorrel, CRNA Referring MD:              Medicines:                Monitored Anesthesia Care Complications:            No immediate complications. Estimated blood loss:                            Minimal. Estimated Blood Loss:     Estimated blood loss was minimal. Procedure:                Pre-Anesthesia Assessment:                           - Prior to the procedure, a History and Physical                            was performed, and patient medications and                            allergies were reviewed. The patient's tolerance of                            previous anesthesia was also reviewed. The risks                            and benefits of the procedure and the sedation                            options and risks were discussed with the patient.                            All questions were answered, and informed consent                            was obtained. Prior Anticoagulants: The patient has                            taken Eliquis (apixaban), last dose was 3 days                            prior  to procedure. ASA Grade Assessment: III - A                            patient with severe systemic disease. After                            reviewing the risks and benefits, the patient was                            deemed in satisfactory condition to undergo the                            procedure.                           After obtaining informed consent, the colonoscope                             was passed under direct vision. Throughout the                            procedure, the patient's blood pressure, pulse, and                            oxygen saturations were monitored continuously. The                            PCF-H190DL ([id number]) Olympus pediatric colonoscope                            was introduced through the anus and advanced to the                            the cecum, identified by appendiceal orifice and                            ileocecal valve. The colonoscopy was performed                            without difficulty. The patient tolerated the                            procedure well. The quality of the bowel                            preparation was good. The ileocecal valve,                            appendiceal orifice, and rectum were photographed. Scope In: 9:22:38 AM Scope Out: 9:57:34 AM Scope Withdrawal Time: 0 hours 32 minutes 0 seconds  Total Procedure Duration: 0 hours 34 minutes 56 seconds  Findings:      The perianal and digital rectal examinations were normal.      Non-bleeding internal hemorrhoids were found.      A few small-mouthed  diverticula were found in the sigmoid colon.      Two sessile polyps were found in the rectum. The polyps were 2 to 3 mm       in size. These polyps were removed with a cold snare. Resection and       retrieval were complete. Estimated blood loss was minimal.      Six sessile polyps were found in the descending colon. The polyps were 2       to 5 mm in size. These polyps were removed with a cold snare. Resection       and retrieval were complete. Estimated blood loss was minimal.      Three sessile polyps were found in the transverse colon. The polyps were       2 to 3 mm in size. These polyps were removed with a cold snare.       Resection and retrieval were complete. Estimated blood loss was minimal.      Three sessile polyps were found in the ascending colon. The polyps were       2 to 3  mm in size. These polyps were removed with a cold snare.       Resection and retrieval were complete. Estimated blood loss was minimal.      The exam was otherwise without abnormality on direct and retroflexion       views. However, over 104mL of liquid was lavaged during the procedure.       Small, flat polyps may have been missed due to the amount of retained       liquid. Impression:               - Non-bleeding internal hemorrhoids.                           - Diverticulosis in the sigmoid colon.                           - Two 2 to 3 mm polyps in the rectum, removed with                            a cold snare. Resected and retrieved.                           - Six 2 to 5 mm polyps in the descending colon,                            removed with a cold snare. Resected and retrieved.                           - Three 2 to 3 mm polyps in the transverse colon,                            removed with a cold snare. Resected and retrieved.                           - Three 2 to 3 mm polyps in the ascending colon,  removed with a cold snare. Resected and retrieved.                           - Source of bleeding not identified on this study. Recommendation:           - Patient has a contact number available for                            emergencies. The signs and symptoms of potential                            delayed complications were discussed with the                            patient. Return to normal activities tomorrow.                            Written discharge instructions were provided to the                            patient.                           - Clear liquid diet.                           - Continue present medications.                           - Continue to hold Eliquis for at least 48 hours.                           - Proceed with capsule endoscopy today for further                            evaluation.                           -  Await pathology results.                           - Repeat colonoscopy in 1 year for surveillance                            given the number of polyps and the liquid present                            in the colon. Procedure Code(s):        --- Professional ---                           [phone], Colonoscopy, flexible; with removal of                            tumor(s), polyp(s), or other lesion(s) by snare  technique Diagnosis Code(s):        --- Professional ---                           K64.8, Other hemorrhoids                           K62.1, Rectal polyp                           K63.5, Polyp of colon                           K92.1, Melena (includes Hematochezia)                           K57.30, Diverticulosis of large intestine without                            perforation or abscess without bleeding CPT copyright 2019 American Medical Association. All rights reserved. The codes documented in this report are preliminary and upon coder review may  be revised to meet current compliance requirements. [name] MD, MD 04/16/2021 10:14:06 AM This report has been signed electronically. Number of Addenda: 0

## 2021-04-16 NOTE — Progress Notes (Addendum)
Subjective:  O/N Events: none  [name] was seen at bedside today. He states that he is doing well this morning and is ready to go to the bathroom. He was able to drink all of his prep. He had increased urination, and filled all of his urinals and needed to use the toilet as to avoid any accidents.   Objective:  Vital signs in last 24 hours: Vitals:   04/15/21 1314 04/15/21 1347 04/15/21 2015 04/16/21 0446  BP: (!) 165/76 129/87 (!) 141/84 109/70  Pulse: 71 85 70 75  Resp: (!) 23 19 19 19   Temp:  97.7 F (36.5 C) 97.8 F (36.6 C) 98.2 F (36.8 C)  TempSrc:  Oral Oral   SpO2: 92% (!) 89% 92% 90%  Weight:      Height:       Physical Exam Constitutional:      General: He is not in acute distress.    Appearance: He is obese. He is not ill-appearing, toxic-appearing or diaphoretic.  Cardiovascular:     Rate and Rhythm: Normal rate and regular rhythm.     Pulses: Normal pulses.     Heart sounds: Normal heart sounds.  Pulmonary:     Effort: Pulmonary effort is normal. No respiratory distress.     Breath sounds: Normal breath sounds. No wheezing, rhonchi or rales.  Musculoskeletal:     Comments: Lower extremities show 1+ pitting edema, skin wrinkles beginning to show, less tight than yesterday.   Neurological:     Mental Status: He is alert.      CBC Latest Ref Rng & Units 04/15/2021 04/14/2021 04/13/2021  WBC 4.0 - 10.5 K/uL 5.8 6.9 5.4  Hemoglobin 13.0 - 17.0 g/dL 8.3(L) 8.2(L) 6.9(LL)  Hematocrit 39.0 - 52.0 % 26.4(L) 27.0(L) 22.5(L)  Platelets 150 - 400 K/uL 240 247 231    CMP Latest Ref Rng & Units 04/15/2021 04/14/2021 04/13/2021  Glucose 70 - 99 mg/dL 128(H) 135(H) 75  BUN 6 - 20 mg/dL 21(H) 25(H) 27(H)  Creatinine 0.61 - 1.24 mg/dL 1.36(H) 1.49(H) 1.45(H)  Sodium 135 - 145 mmol/L 139 139 141  Potassium 3.5 - 5.1 mmol/L 3.5 3.8 3.6  Chloride 98 - 111 mmol/L 102 102 105  CO2 22 - 32 mmol/L 30 31 30   Calcium 8.9 - 10.3 mg/dL 8.3(L) 8.5(L) 8.1(L)  Total Protein 6.5  - 8.1 g/dL - - 6.4(L)  Total Bilirubin 0.3 - 1.2 mg/dL - - 0.7  Alkaline Phos 38 - 126 U/L - - 51  AST 15 - 41 U/L - - 36  ALT 0 - 44 U/L - - 36     Intake/Output Summary (Last 24 hours) at 04/16/2021 0646 Last data filed at 04/16/2021 0446 Gross per 24 hour  Intake 200 ml  Output 700 ml  Net -500 ml     Assessment/Plan:  Active Problems:   Angiodysplasia of duodenum with hemorrhage   CHF exacerbation (HCC)   GI bleeding  #Acute on chronic HFrEF (EF 30%) exacerbation  HFrEF exacerbation likely in the setting of increased salt intake. 2.5 L of output on 5/19. Weight today is stable at 155.6 kg and in 03/08/21 was 146.6 kg. S/P Faraheme infusion on 5/20. Total output is 2.7L. Concern for low output today, but patient had to use the toilet multiple times throughout the night and morning as his bedside urinals were full. Will continue current Lasix regimen.  - Continue diuresis with IV Lasix 80 mg BID  - Trend BMP - Cr  1.18 - Continue Strict I&Os - Daily Weights - Replenish K as needed >4.0 and Mg >2 as needed  - Continue Farxiga 10 mg daily  - Continue Coreg 25 mg twice daily   #GI bleed  #Anemia   Enteroscopy showed 2 very small angioectasias with no bleeding were found in the third portion of the duodenum. Coagulated with APC. Hgb 8.3 Stable. Underwent colonoscopy and multiple polyps were biopsied. No source of bleeding found, will have a pill endoscopy today.  -Trend CBC   -Continue home Protonix 40 mg twice daily  -Holding Eliquis  #Ischemic Cardiomyopathy with ICD placement  #Elevated troponin (resolved)  -Continue Atorvastatin 40 mg daily  -Hold blood pressure medications given GI bleed  -Continue to monitor symptoms and physical exam  -Cardiac monitoring   #Diabetes Mellitus  Last measured A1c 7.1% 03/2021. Home regimen includes metformin 500 mg twice daily, Farxiga 10 mg daily, glipizide 10 mg twice daily, Lantus 20 units daily, and Humalog 30 units twice  daily. -Continue Farxiga 10 mg daily  -Hold metformin and glipizide -Lantus 20 units daily  -resistant SSI for correctional insulin   #Paroxysmal atrial fibrillation  Most recent burden of atrial fibrillation of 2.1% and anticoagulated on Eliquis 5 mg twice daily.  - Hold Eliquis given GI bleed - Continue Coreg 25 mg twice daily   #CKD Stage IIIa #AKI  Creatinine 1.18, which has improved from Cr of 1.39. Baseline seems to fluctuate between 1.1-1.2.  -Continue to trend BMP   #Obstructive Sleep Apnea  -CPAP nightly if available   #Hypertension  Home blood pressure medications include hydralazine 25 mg (1.5 tablets) three times daily, Entresto 97-103 twice daily, spironolactone 25 mg daily, and Coreg 25 mg twice daily.  -Continue Coreg 25 mg twice daily  -Hold all other BP meds due to GI bleed, higher risk for hypotension from blood loss   Prior to Admission Living Arrangement: Home Anticipated Discharge Location:Home Barriers to Discharge: Continued medical work up.  Dispo: Anticipated discharge in approximately 2-3 day(s).   Maudie Mercury, MD 04/16/2021, 6:45 AM Pager: [phone] After 5pm on weekdays and 1pm on weekends: On Call pager [phone]

## 2021-04-16 NOTE — Interval H&P Note (Signed)
History and Physical Interval Note:  04/16/2021 9:02 AM  [name]  has presented today for surgery, with the diagnosis of Anemia.  FOBT positive dark stool.  Adenomatous colon polyp 2017..  The various methods of treatment have been discussed with the patient and family. After consideration of risks, benefits and other options for treatment, the patient has consented to  Procedure(s): COLONOSCOPY WITH PROPOFOL (N/A) as a surgical intervention.  The patient's history has been reviewed, patient examined, no change in status, stable for surgery.  I have reviewed the patient's chart and labs.  Questions were answered to the patient's satisfaction.     [name]

## 2021-04-16 NOTE — Anesthesia Preprocedure Evaluation (Addendum)
Anesthesia Evaluation  Patient identified by MRN, date of birth, ID band Patient awake    Reviewed: Allergy & Precautions, NPO status , Patient's Chart, lab work & pertinent test results  Airway Mallampati: III  TM Distance: >3 FB Neck ROM: Full    Dental  (+) Poor Dentition, Missing   Pulmonary sleep apnea and Continuous Positive Airway Pressure Ventilation , former smoker,    Pulmonary exam normal        Cardiovascular hypertension, Pt. on medications and Pt. on home beta blockers + CAD and +CHF  + dysrhythmias (on Eliquis) Atrial Fibrillation + Cardiac Defibrillator  Rhythm:Irregular Rate:Normal     Neuro/Psych Anxiety Depression Schizophrenia    GI/Hepatic Neg liver ROS, GERD  Medicated,+FOBT, history polyp, AVM, GIB   Endo/Other  diabetes, Type 2, Oral Hypoglycemic Agents, Insulin Dependent  Renal/GU negative Renal ROS  negative genitourinary   Musculoskeletal negative musculoskeletal ROS (+)   Abdominal (+) + obese,  Abdomen: soft.    Peds  Hematology  (+) anemia ,   Anesthesia Other Findings   Reproductive/Obstetrics                            Anesthesia Physical Anesthesia Plan  ASA: IV  Anesthesia Plan: MAC   Post-op Pain Management:    Induction: Intravenous  PONV Risk Score and Plan: 1 and Propofol infusion  Airway Management Planned: Simple Face Mask, Natural Airway and Nasal Cannula  Additional Equipment: None  Intra-op Plan:   Post-operative Plan:   Informed Consent: I have reviewed the patients History and Physical, chart, labs and discussed the procedure including the risks, benefits and alternatives for the proposed anesthesia with the patient or authorized representative who has indicated his/her understanding and acceptance.     Dental advisory given  Plan Discussed with: CRNA  Anesthesia Plan Comments: (Lab Results      Component                 Value               Date                      WBC                      5.9                 04/16/2021                HGB                      8.3 (L)             04/16/2021                HCT                      26.9 (L)            04/16/2021                MCV                      88.5                04/16/2021                PLT  256                 04/16/2021           Lab Results      Component                Value               Date                      NA                       140                 04/16/2021                K                        3.7                 04/16/2021                CO2                      29                  04/16/2021                GLUCOSE                  109 (H)             04/16/2021                BUN                      13                  04/16/2021                CREATININE               1.18                04/16/2021                CALCIUM                  8.5 (L)             04/16/2021                GFRNONAA                 >60                 04/16/2021                GFRAA                    96                  09/01/2020           ECHO 05/21: 1. Left ventricular ejection fraction, by estimation, is 30%. The left  ventricle has moderate to severely decreased function. The left ventricle  demonstrates global hypokinesis. There is mild left ventricular  hypertrophy. Left ventricular diastolic  parameters are consistent with Grade II  diastolic dysfunction  (pseudonormalization).  2. Right ventricular systolic function is mildly reduced. The right  ventricular size is mildly enlarged. There is mildly elevated pulmonary  artery systolic pressure. The estimated right ventricular systolic  pressure is 69.6 mmHg.  3. Left atrial size was mildly dilated.  4. Right atrial size was mildly dilated.  5. The mitral valve is normal in structure. Trivial mitral valve  regurgitation. No evidence of mitral stenosis.  6. The  aortic valve is tricuspid. Aortic valve regurgitation is not  visualized. No aortic stenosis is present.  7. The inferior vena cava is dilated in size with <50% respiratory  variability, suggesting right atrial pressure of 15 mmHg. )       Anesthesia Quick Evaluation

## 2021-04-16 NOTE — Transfer of Care (Signed)
Immediate Anesthesia Transfer of Care Note  Patient: [name]  Procedure(s) Performed: COLONOSCOPY WITH PROPOFOL (N/A ) POLYPECTOMY GIVENS CAPSULE STUDY  Patient Location: Endoscopy Unit  Anesthesia Type:MAC  Level of Consciousness: awake, alert  and oriented  Airway & Oxygen Therapy: Patient Spontanous Breathing  Post-op Assessment: Report given to RN and Post -op Vital signs reviewed and stable  Post vital signs: Reviewed and stable  Last Vitals:  Vitals Value Taken Time  BP 145/68 04/16/21 1016  Temp 37 C 04/16/21 1016  Pulse 63 04/16/21 1016  Resp 20 04/16/21 1016  SpO2 91 % 04/16/21 1016    Last Pain:  Vitals:   04/16/21 1016  TempSrc: Oral  PainSc: 0-No pain         Complications: No complications documented.

## 2021-04-17 ENCOUNTER — Encounter (HOSPITAL_COMMUNITY): Payer: Self-pay | Admitting: Gastroenterology

## 2021-04-17 DIAGNOSIS — K922 Gastrointestinal hemorrhage, unspecified: Secondary | ICD-10-CM | POA: Diagnosis not present

## 2021-04-17 DIAGNOSIS — K31811 Angiodysplasia of stomach and duodenum with bleeding: Secondary | ICD-10-CM | POA: Diagnosis not present

## 2021-04-17 LAB — GLUCOSE, CAPILLARY
Glucose-Capillary: 123 mg/dL — ABNORMAL HIGH (ref 70–99)
Glucose-Capillary: 130 mg/dL — ABNORMAL HIGH (ref 70–99)
Glucose-Capillary: 146 mg/dL — ABNORMAL HIGH (ref 70–99)
Glucose-Capillary: 152 mg/dL — ABNORMAL HIGH (ref 70–99)
Glucose-Capillary: 176 mg/dL — ABNORMAL HIGH (ref 70–99)
Glucose-Capillary: 179 mg/dL — ABNORMAL HIGH (ref 70–99)
Glucose-Capillary: 94 mg/dL (ref 70–99)

## 2021-04-17 LAB — BASIC METABOLIC PANEL
Anion gap: 7 (ref 5–15)
BUN: 11 mg/dL (ref 6–20)
CO2: 31 mmol/L (ref 22–32)
Calcium: 8.4 mg/dL — ABNORMAL LOW (ref 8.9–10.3)
Chloride: 100 mmol/L (ref 98–111)
Creatinine, Ser: 1.09 mg/dL (ref 0.61–1.24)
GFR, Estimated: >60 mL/min (ref >60)
Glucose, Bld: 109 mg/dL — ABNORMAL HIGH (ref 70–99)
Potassium: 4 mmol/L (ref 3.5–5.1)
Sodium: 138 mmol/L (ref 135–145)

## 2021-04-17 LAB — CBC
HCT: 25.8 % — ABNORMAL LOW (ref 39.0–52.0)
Hemoglobin: 7.9 g/dL — ABNORMAL LOW (ref 13.0–17.0)
MCH: 27.3 pg (ref 26.0–34.0)
MCHC: 30.6 g/dL (ref 30.0–36.0)
MCV: 89.3 fL (ref 80.0–100.0)
Platelets: 283 10*3/uL (ref 150–400)
RBC: 2.89 MIL/uL — ABNORMAL LOW (ref 4.22–5.81)
RDW: 15.3 % (ref 11.5–15.5)
WBC: 5.8 10*3/uL (ref 4.0–10.5)
nRBC: 0 % (ref 0.0–0.2)

## 2021-04-17 LAB — MAGNESIUM: Magnesium: 2.1 mg/dL (ref 1.7–2.4)

## 2021-04-17 NOTE — Progress Notes (Addendum)
     [facility] Progress Note  CC:  I'd like this cough to go away  Assessment / Plan: Recurrent symptomatic anemia and hemoccult positive stool occurring in the setting of chronic Eliquis for paroxysmal atrial fibrillation. He also has a history of ischemic cardiomyopathy with prior ICD placement.   Despite a history of upper GI AVMs on multiple endoscopies including most recently on 06/17/20, push enteroscopy 04/15/21 showed two very small duodenal AVMs. Subsequent colonoscopy 04/16/21 revealed 13 relatively small polyps that were removed, but, no obvious source for anemia. Hemoglobin has been essentially stable except for a small decline this morning without any overt bleeding.  Capsule endoscopy completed this morning.   Recommendations: - Hope to have results of the capsule endoscopy tomorrow.    - Holding Eliquis after recent polypectomy, ideally until after results of capsule endoscopy are available tomorrow.  - Continue to monitor serial hgb/hct with transfusion as indicated. - Regular diet at this time.   Subjective: No GI complaints. No significant BM following colonoscopy yesterday. He is most bothered a persistent cough that he doesn't feel has improved during this hospitalization.  A friend was present at the bedside.   Objective:  Vital signs in last 24 hours: Temp:  [97.6 F (36.4 C)-99 F (37.2 C)] 98.6 F (37 C) (05/22 0415) Pulse Rate:  [58-64] 60 (05/22 0415) Resp:  [16-18] 18 (05/22 0415) BP: (118-136)/(67-94) 130/88 (05/22 0415) SpO2:  [95 %-97 %] 97 % (05/22 0415) Last BM Date: 04/16/21 General:   Alert, in NAD Abdomen:  Soft. Nontender. Nondistended. Normal bowel sounds. No rebound or guarding. Neurologic:  Alert and  oriented x4;  grossly normal neurologically. Psych:  Alert and cooperative. Normal mood and affect.  Intake/Output from previous day: 05/21 0701 - 05/22 0700 In: 900 [P.O.:600; I.V.:300] Out: 1350 [Urine:1350] Intake/Output  this shift: No intake/output data recorded.  Lab Results: Recent Labs    04/15/21 0134 04/16/21 0649 04/17/21 0131  WBC 5.8 5.9 5.8  HGB 8.3* 8.3* 7.9*  HCT 26.4* 26.9* 25.8*  PLT 240 256 283   BMET Recent Labs    04/15/21 0134 04/16/21 0649 04/17/21 0131  NA 139 140 138  K 3.5 3.7 4.0  CL 102 105 100  CO2 30 29 31   GLUCOSE 128* 109* 109*  BUN 21* 13 11  CREATININE 1.36* 1.18 1.09  CALCIUM 8.3* 8.5* 8.4*     LOS: 3 days   [name]  04/17/2021, 11:43 AM

## 2021-04-17 NOTE — Anesthesia Postprocedure Evaluation (Signed)
Anesthesia Post Note  Patient: [name]  Procedure(s) Performed: COLONOSCOPY WITH PROPOFOL (N/A ) POLYPECTOMY GIVENS CAPSULE STUDY     Patient location during evaluation: Endoscopy Anesthesia Type: MAC Level of consciousness: awake and alert Pain management: pain level controlled Vital Signs Assessment: post-procedure vital signs reviewed and stable Respiratory status: spontaneous breathing, nonlabored ventilation, respiratory function stable and patient connected to nasal cannula oxygen Cardiovascular status: stable and blood pressure returned to baseline Postop Assessment: no apparent nausea or vomiting Anesthetic complications: no   No complications documented.  Last Vitals:  Vitals:   04/17/21 0415 04/17/21 1300  BP: 130/88 136/73  Pulse: 60 88  Resp: 18 17  Temp: 37 C 37.4 C  SpO2: 97% 93%    Last Pain:  Vitals:   04/17/21 1300  TempSrc: Oral  PainSc:                  March Rummage Alexea Blase

## 2021-04-17 NOTE — Progress Notes (Signed)
Subjective:  O/N Events: none  [name] was seen at bedside today. He states that he is doing well, endorses light cough that has improved since admission. He is still urinated frequently and has to use the toilet at times when his bedside urinals are full. He is unsure if he has been weighed this admission. He states that as soon as we are done interviewing he will need to urinate.   Objective:  Vital signs in last 24 hours: Vitals:   04/16/21 1030 04/16/21 1420 04/16/21 2021 04/17/21 0415  BP: (!) 136/57 118/67 (!) 136/94 130/88  Pulse: (!) 57 (!) 58 64 60  Resp: 15 16 18 18   Temp:  97.6 F (36.4 C) 99 F (37.2 C) 98.6 F (37 C)  TempSrc:  Oral Oral Oral  SpO2: 96% 95% 96% 97%  Weight:      Height:        Physical Exam Constitutional:      General: He is not in acute distress.    Appearance: He is obese.     Comments: Does not appear in acute distress  Cardiovascular:     Rate and Rhythm: Normal rate and regular rhythm.     Pulses: Normal pulses.     Heart sounds: Normal heart sounds.  Pulmonary:     Effort: Pulmonary effort is normal.     Breath sounds: Normal breath sounds.     Comments: Breathing comfortably on room air Abdominal:     General: Bowel sounds are normal.     Tenderness: There is no abdominal tenderness.  Musculoskeletal:     Comments: Much improved over yesterday, trace to 1+ pitting edema, skin less tense than yesterday.      CBC Latest Ref Rng & Units 04/17/2021 04/16/2021 04/15/2021  WBC 4.0 - 10.5 K/uL 5.8 5.9 5.8  Hemoglobin 13.0 - 17.0 g/dL 7.9(L) 8.3(L) 8.3(L)  Hematocrit 39.0 - 52.0 % 25.8(L) 26.9(L) 26.4(L)  Platelets 150 - 400 K/uL 283 256 240    CMP Latest Ref Rng & Units 04/17/2021 04/16/2021 04/15/2021  Glucose 70 - 99 mg/dL 109(H) 109(H) 128(H)  BUN 6 - 20 mg/dL 11 13 21(H)  Creatinine 0.61 - 1.24 mg/dL 1.09 1.18 1.36(H)  Sodium 135 - 145 mmol/L 138 140 139  Potassium 3.5 - 5.1 mmol/L 4.0 3.7 3.5  Chloride 98 - 111 mmol/L 100  105 102  CO2 22 - 32 mmol/L 31 29 30   Calcium 8.9 - 10.3 mg/dL 8.4(L) 8.5(L) 8.3(L)  Total Protein 6.5 - 8.1 g/dL - - -  Total Bilirubin 0.3 - 1.2 mg/dL - - -  Alkaline Phos 38 - 126 U/L - - -  AST 15 - 41 U/L - - -  ALT 0 - 44 U/L - - -     Intake/Output Summary (Last 24 hours) at 04/17/2021 0160 Last data filed at 04/17/2021 0415 Gross per 24 hour  Intake 900 ml  Output 1350 ml  Net -450 ml     Assessment/Plan:  Active Problems:   Angiodysplasia of duodenum with hemorrhage   CHF exacerbation (HCC)   GI bleeding   Adenomatous polyp of ascending colon  #Acute on chronic HFrEF (EF 30%) exacerbation  HFrEF exacerbation likely in the setting of increased salt intake. 2.5 L of output on 5/19. Weight on 03/08/21 was 146.6 kg, last weight from 5/20 is unchanged since admission at 155.6 kg.Total output is 3.2L. We have had some unmeasured output due to the patient using the toilet. Patient unsure  if he has - Continue diuresis with IV Lasix 80 mg BID  - Trend BMP - Cr 1.09 - Continue Strict I&Os - Daily Weights - Replenish K as needed >4.0 and Mg >2 as needed  - Continue Farxiga 10 mg daily  - Continue Coreg 25 mg twice daily   #GI bleed  #Anemia   Enteroscopy showed 2 very small angioectasias with no bleeding were found in the third portion of the duodenum. Coagulated with APC. Hgb 7.9 from 8.3 yesterday. S/P Faraheme infusion on 5/20.  Underwent colonoscopy and multiple polyps were biopsied 2/21. No source of bleeding found. Underwent pill endoscopy 5/22, awaiting delivery.  -Trend CBC   -Continue home Protonix 40 mg twice daily  -Holding Eliquis  #Ischemic Cardiomyopathy with ICD placement  #Elevated troponin (resolved)  -Continue Atorvastatin 40 mg daily  -Hold blood pressure medications given GI bleed  -Continue to monitor symptoms and physical exam  -Cardiac monitoring   #Diabetes Mellitus  Last measured A1c 7.1% 03/2021. Home regimen includes metformin 500 mg twice  daily, Farxiga 10 mg daily, glipizide 10 mg twice daily, Lantus 20 units daily, and Humalog 30 units twice daily. -Continue Farxiga 10 mg daily  -Hold metformin and glipizide -Lantus 20 units daily  -resistant SSI for correctional insulin   #Paroxysmal atrial fibrillation  Most recent burden of atrial fibrillation of 2.1% and anticoagulated on Eliquis 5 mg twice daily.  - Hold Eliquis given GI bleed - Continue Coreg 25 mg twice daily   #CKD Stage IIIa #AKI  Creatinine 1.18, which has improved from Cr of 1.39. Baseline seems to fluctuate between 1.1-1.2.  -Continue to trend BMP   #Obstructive Sleep Apnea  -CPAP nightly if available   #Hypertension  Home blood pressure medications include hydralazine 25 mg (1.5 tablets) three times daily, Entresto 97-103 twice daily, spironolactone 25 mg daily, and Coreg 25 mg twice daily.  -Continue Coreg 25 mg twice daily  -Hold all other BP meds due to GI bleed, higher risk for hypotension from blood loss   Prior to Admission Living Arrangement: Home Anticipated Discharge Location:Home Barriers to Discharge: Continued medical work up.  Dispo: Anticipated discharge in approximately 2-3 day(s).   Maudie Mercury, MD 04/17/2021, 6:49 AM Pager: [phone] After 5pm on weekdays and 1pm on weekends: On Call pager [phone]

## 2021-04-18 ENCOUNTER — Other Ambulatory Visit: Payer: Self-pay

## 2021-04-18 DIAGNOSIS — R195 Other fecal abnormalities: Secondary | ICD-10-CM | POA: Diagnosis not present

## 2021-04-18 DIAGNOSIS — K31811 Angiodysplasia of stomach and duodenum with bleeding: Secondary | ICD-10-CM | POA: Diagnosis not present

## 2021-04-18 DIAGNOSIS — D500 Iron deficiency anemia secondary to blood loss (chronic): Secondary | ICD-10-CM | POA: Diagnosis not present

## 2021-04-18 LAB — CBC
HCT: 28.1 % — ABNORMAL LOW (ref 39.0–52.0)
Hemoglobin: 8.4 g/dL — ABNORMAL LOW (ref 13.0–17.0)
MCH: 26.7 pg (ref 26.0–34.0)
MCHC: 29.9 g/dL — ABNORMAL LOW (ref 30.0–36.0)
MCV: 89.2 fL (ref 80.0–100.0)
Platelets: 309 10*3/uL (ref 150–400)
RBC: 3.15 MIL/uL — ABNORMAL LOW (ref 4.22–5.81)
RDW: 15.5 % (ref 11.5–15.5)
WBC: 6.3 10*3/uL (ref 4.0–10.5)
nRBC: 0.3 % — ABNORMAL HIGH (ref 0.0–0.2)

## 2021-04-18 LAB — BASIC METABOLIC PANEL
Anion gap: 5 (ref 5–15)
BUN: 12 mg/dL (ref 6–20)
CO2: 30 mmol/L (ref 22–32)
Calcium: 8.4 mg/dL — ABNORMAL LOW (ref 8.9–10.3)
Chloride: 103 mmol/L (ref 98–111)
Creatinine, Ser: 1.33 mg/dL — ABNORMAL HIGH (ref 0.61–1.24)
GFR, Estimated: >60 mL/min (ref >60)
Glucose, Bld: 151 mg/dL — ABNORMAL HIGH (ref 70–99)
Potassium: 4.3 mmol/L (ref 3.5–5.1)
Sodium: 138 mmol/L (ref 135–145)

## 2021-04-18 LAB — GLUCOSE, CAPILLARY
Glucose-Capillary: 147 mg/dL — ABNORMAL HIGH (ref 70–99)
Glucose-Capillary: 124 mg/dL — ABNORMAL HIGH (ref 70–99)
Glucose-Capillary: 162 mg/dL — ABNORMAL HIGH (ref 70–99)
Glucose-Capillary: 168 mg/dL — ABNORMAL HIGH (ref 70–99)
Glucose-Capillary: 162 mg/dL — ABNORMAL HIGH (ref 70–99)

## 2021-04-18 MED ORDER — CARVEDILOL 6.25 MG PO TABS
6.2500 mg | ORAL_TABLET | Freq: Two times a day (BID) | ORAL | Status: DC
  Administered 2021-04-18: 6.25 mg via ORAL
  Filled 2021-04-18: qty 1

## 2021-04-18 MED ORDER — FUROSEMIDE 40 MG PO TABS
60.0000 mg | ORAL_TABLET | Freq: Two times a day (BID) | ORAL | Status: DC
  Administered 2021-04-18: 60 mg via ORAL
  Filled 2021-04-18: qty 1

## 2021-04-18 MED ORDER — SODIUM CHLORIDE 0.9 % IV SOLN: INTRAVENOUS | Status: DC

## 2021-04-18 NOTE — Anesthesia Postprocedure Evaluation (Signed)
Anesthesia Post Note  Patient: [name]  Procedure(s) Performed: ENTEROSCOPY (N/A ) HOT HEMOSTASIS (ARGON PLASMA COAGULATION/BICAP) (N/A )     Patient location during evaluation: Endoscopy Anesthesia Type: MAC Level of consciousness: awake and alert Pain management: pain level controlled Vital Signs Assessment: post-procedure vital signs reviewed and stable Respiratory status: spontaneous breathing, nonlabored ventilation, respiratory function stable and patient connected to nasal cannula oxygen Cardiovascular status: stable and blood pressure returned to baseline Postop Assessment: no apparent nausea or vomiting Anesthetic complications: no   No complications documented.  Last Vitals:  Vitals:   04/18/21 0636 04/18/21 0836  BP: (!) 90/45 (!) 150/67  Pulse: 63 60  Resp:  18  Temp:  37.2 C  SpO2: 99% 98%    Last Pain:  Vitals:   04/18/21 0836  TempSrc: Oral  PainSc:                  [name]

## 2021-04-18 NOTE — Progress Notes (Signed)
[facility] Progress Note  Chief Complaint: Chronic blood loss anemia  History:  Signout received from Dr. Tarri Glenn and chart review performed.  Reports from colonoscopy and small bowel enteroscopy of this admission were personally reviewed.  I also read his video capsule study earlier today (see report below). This patient denies black or bloody stool, chest pain dyspnea or abdominal pain. Hypotensive earlier today, nursing reports that antihypertensive medicine has been held.   Objective:   Current Facility-Administered Medications:  .  acetaminophen (TYLENOL) tablet 1,000 mg, 1,000 mg, Oral, Q8H PRN, Maudie Mercury, MD .  albuterol (PROVENTIL) (2.5 MG/3ML) 0.083% nebulizer solution 2.5 mg, 2.5 mg, Nebulization, Q4H PRN, Maudie Mercury, MD .  atorvastatin (LIPITOR) tablet 40 mg, 40 mg, Oral, q1800, Maudie Mercury, MD, 40 mg at 04/17/21 1626 .  cyclobenzaprine (FLEXERIL) tablet 10 mg, 10 mg, Oral, TID PRN, Maudie Mercury, MD .  dapagliflozin propanediol (FARXIGA) tablet 10 mg, 10 mg, Oral, QAC breakfast, Maudie Mercury, MD, 10 mg at 04/18/21 6213 .  insulin aspart (novoLOG) injection 0-20 Units, 0-20 Units, Subcutaneous, Q4H, Maudie Mercury, MD, 4 Units at 04/18/21 1219 .  insulin glargine (LANTUS) injection 20 Units, 20 Units, Subcutaneous, Daily, Maudie Mercury, MD, 20 Units at 04/18/21 1021 .  pantoprazole (PROTONIX) EC tablet 40 mg, 40 mg, Oral, Daily, Vena Rua, PA-C, 40 mg at 04/18/21 1021 .  potassium chloride SA (KLOR-CON) CR tablet 40 mEq, 40 mEq, Oral, BID, Maudie Mercury, MD, 40 mEq at 04/18/21 1021 .  QUEtiapine (SEROQUEL) tablet 300 mg, 300 mg, Oral, QHS, Maudie Mercury, MD, 300 mg at 04/17/21 2141     Vital signs in last 24 hrs: Vitals:   04/18/21 1100 04/18/21 1303  BP: 126/60 140/68  Pulse: (!) 56 66  Resp: 18 19  Temp: 99.7 F (37.6 C) 98.7 F (37.1 C)  SpO2: 97% 94%    Intake/Output Summary (Last 24 hours) at 04/18/2021 1323 Last data filed at  04/18/2021 1305 Gross per 24 hour  Intake 1190 ml  Output 3000 ml  Net -1810 ml     Physical Exam Not acutely ill-appearing.  Sitting on edge of bed, pleasant and conversational and comfortable.  HEENT: sclera anicteric, oral mucosa without lesions  Neck: supple, no thyromegaly, JVD or lymphadenopathy  Cardiac: RRR without murmurs, S1S2 heard, no peripheral edema  Pulm: clear to auscultation bilaterally, normal RR and effort noted  Abdomen: soft, no tenderness, with active bowel sounds.  Difficult to assess mass or hepatosplenomegaly due to body habitus  Skin; warm and dry, no jaundice  Recent Labs:  CBC Latest Ref Rng & Units 04/18/2021 04/17/2021 04/16/2021  WBC 4.0 - 10.5 K/uL 6.3 5.8 5.9  Hemoglobin 13.0 - 17.0 g/dL 8.4(L) 7.9(L) 8.3(L)  Hematocrit 39.0 - 52.0 % 28.1(L) 25.8(L) 26.9(L)  Platelets 150 - 400 K/uL 309 283 256    No results for input(s): INR in the last 168 hours. CMP Latest Ref Rng & Units 04/18/2021 04/17/2021 04/16/2021  Glucose 70 - 99 mg/dL 151(H) 109(H) 109(H)  BUN 6 - 20 mg/dL 12 11 13   Creatinine 0.61 - 1.24 mg/dL 1.33(H) 1.09 1.18  Sodium 135 - 145 mmol/L 138 138 140  Potassium 3.5 - 5.1 mmol/L 4.3 4.0 3.7  Chloride 98 - 111 mmol/L 103 100 105  CO2 22 - 32 mmol/L 30 31 29   Calcium 8.9 - 10.3 mg/dL 8.4(L) 8.4(L) 8.5(L)  Total Protein 6.5 - 8.1 g/dL - - -  Total Bilirubin 0.3 - 1.2 mg/dL - - -  Alkaline Phos 38 - 126 U/L - - -  AST 15 - 41 U/L - - -  ALT 0 - 44 U/L - - -     Small bowel video capsule study shows rapid small bowel transit time of 52 minutes. There is a nonbleeding AVM seen just under 2 minutes from the first duodenal image.  Is difficult to know how far past the duodenum that may be given the rapid transit time. At least 1 colon polyp was also seen, limited colon visualization due to prep.  Assessment & Plan  Assessment:  Anemia chronic GI blood loss Heme positive stool Chronic oral anticoagulation use (has been held this  admission and not yet resumed)   Plan: The AVM seen on this capsule study may not have been visualized on the small bowel enteroscopy.  It is difficult to know if it is within reach of the enteroscope, but given the nature of these lesions and frequent difficulty localizing them behind mucosal folds, I think a repeat small bowel enteroscopy tomorrow is warranted.  I discussed this with him, and he was agreeable after discussion of procedure and risks.  (His nurse was present during that discussion)  The benefits and risks of the planned procedure were described in detail with the patient or (when appropriate) their health care proxy.  Risks were outlined as including, but not limited to, bleeding, infection, perforation, adverse medication reaction leading to cardiac or pulmonary decompensation, pancreatitis (if ERCP).  The limitation of incomplete mucosal visualization was also discussed.  No guarantees or warranties were given.  Patient at increased risk for cardiopulmonary complications of procedure due to medical comorbidities.   35 minutes were spent on this encounter (including chart review, history/exam, counseling/coordination of care, and documentation) > 50% of that time was spent on counseling and coordination of care.  Extensive chart review required.   [name] Office: [phone]

## 2021-04-18 NOTE — Progress Notes (Signed)
Subjective:  [name] BP -90/45 mmHg. No signs of bleeding. He doesn't feel dizziness or weakness. Pt states he had bowel movement and may have passed the capsule camera. He didn't save it as he didn't know if he had to save it. Pt denies any SOB but c/o cough. He states he is also producing whitish yellow sputum. He states he is urinating ok. He states he is expecting to go home today. He was told that we are expecting capsule endoscopy results soon.  Objective:  Vital signs in last 24 hours: Vitals:   04/17/21 1300 04/17/21 2023 04/18/21 0616 04/18/21 0636  BP: 136/73 130/73 (!) 72/47 (!) 90/45  Pulse: 88 60 90 63  Resp: 17 20 18    Temp: 99.4 F (37.4 C) 98.9 F (37.2 C) 98 F (36.7 C)   TempSrc: Oral Oral    SpO2: 93% 96% 100% 99%  Weight:      Height:       CBC Latest Ref Rng & Units 04/18/2021 04/17/2021 04/16/2021  WBC 4.0 - 10.5 K/uL 6.3 5.8 5.9  Hemoglobin 13.0 - 17.0 g/dL 8.4(L) 7.9(L) 8.3(L)  Hematocrit 39.0 - 52.0 % 28.1(L) 25.8(L) 26.9(L)  Platelets 150 - 400 K/uL 309 283 256   CMP Latest Ref Rng & Units 04/18/2021 04/17/2021 04/16/2021  Glucose 70 - 99 mg/dL 151(H) 109(H) 109(H)  BUN 6 - 20 mg/dL 12 11 13   Creatinine 0.61 - 1.24 mg/dL 1.33(H) 1.09 1.18  Sodium 135 - 145 mmol/L 138 138 140  Potassium 3.5 - 5.1 mmol/L 4.3 4.0 3.7  Chloride 98 - 111 mmol/L 103 100 105  CO2 22 - 32 mmol/L 30 31 29   Calcium 8.9 - 10.3 mg/dL 8.4(L) 8.4(L) 8.5(L)  Total Protein 6.5 - 8.1 g/dL - - -  Total Bilirubin 0.3 - 1.2 mg/dL - - -  Alkaline Phos 38 - 126 U/L - - -  AST 15 - 41 U/L - - -  ALT 0 - 44 U/L - - -    Physical Exam Constitutional:      General: He is not in acute distress.    Appearance: He is obese. He is not ill-appearing, toxic-appearing or diaphoretic.     Comments: Doesn't appear in acute distress. Sitting comfortably in chair.  Cardiovascular:     Rate and Rhythm: Normal rate and regular rhythm.     Pulses: Normal pulses.     Heart  sounds: Normal heart sounds.  Pulmonary:     Effort: Pulmonary effort is normal.     Breath sounds: Normal breath sounds. No wheezing, rhonchi or rales.     Comments: Breathing comfortably at room air. Abdominal:     General: Bowel sounds are normal.     Tenderness: There is no abdominal tenderness.  Musculoskeletal:     Comments: Much improved swelling of b/l Lower extremities. Trace 1+ pitting edema present b/l. Skin less tense than before.     Intake/Output Summary (Last 24 hours) at 04/18/2021 0645 Last data filed at 04/18/2021 4268 Gross per 24 hour  Intake 600 ml  Output 4400 ml  Net -3800 ml   Assessment/Plan:  Active Problems:   Angiodysplasia of duodenum with hemorrhage   CHF exacerbation (HCC)   GI bleeding   Adenomatous polyp of ascending colon  [name] is a 60 y.o. male with PMH of ischemic cardiomyopathy, ICD in place, HFrEF (EF 30%), OSA, DM, GI bleed 2/2 AVM, anemia, paroxysmal atrial fibrillation  on Eliquis who presents with shortness of breath, dizziness, chest pain, and 25 lb weight gain.   #Acute on chronic HFrEF (EF 30%) exacerbation  HFrEF exacerbation likely in the setting of increased salt intake. On lasix for diuresis. Net loss since yesterday -3800, Net loss since admit -7027. Weight 155.6 (on 5/20). Pt had soft BP's in the Long. 90/45 mmHg. Denies any SOB. Breathing comfortably.  - Lasix held in the Long. BP normal now. Start diuresis with lasix 60 mg BID.  - Trend BMP. - Continue Strict I&Os - Daily Weights - Replenish K as needed >4.0 and Mg >2 as needed  - Continue Farxiga 10 mg daily  - Coreg held in the Long. Start Coreg 6.25 mg BID.   #GI bleed  #Anemia   Enteroscopy showed 2 very small angioectasias with no bleeding were found in the third portion of the duodenum. Coagulated with APC.  S/P Faraheme infusion on 5/20.  Underwent colonoscopy and multiple polyps were biopsied 2/21. No source of bleeding found. Underwent  pill endoscopy 5/22.  Denies any bleeding.  Hb 8.4 today. Capsule endoscopy results shows non bleeding AVM and GI planning to do repeat small bowel enteroscopy tomorrow - Small bowel enteroscopy tomorrow. -NPO after Midnight.  -Trend CBC.  -Continue home Protonix 40 mg BID.  -Holding Eliquis due to GI bleed.   #Ischemic Cardiomyopathy with ICD placement  #Elevated troponin (resolved)  -Continue Atorvastatin 40 mg daily  -Hold blood pressure medications given GI bleed.  -Continue to monitor symptoms and physical exam.  -Cardiac monitoring   #Diabetes Mellitus  Last measured A1c 7.1% 03/2021. Home regimen includes metformin 500 mg twice daily, Farxiga 10 mg daily, glipizide 10 mg twice daily, Lantus 20 units daily, and Humalog 30 units twice daily. BG 151 this Long.  -Continue Farxiga 10 mg daily  -Hold metformin and glipizide -Lantus 20 units daily. -resistant SSI for correctional insulin   #Paroxysmal atrial fibrillation  Most recent burden of atrial fibrillation of 2.1% and anticoagulated on Eliquis 5 mg twice daily.  - Continue to hold Eliquis given GI bleed.  - Coreg 25 mg held in the Long. Start Coreg 6.25 twice daily.   #AKI  Creatinine 1.33. GFR >60. Baseline seems to fluctuate between 1.1-1.2. Likely due to diuresis.  -Continue to trend BMP   #Obstructive Sleep Apnea  -CPAP nightly if available   #Hypertension  Home blood pressure medications include hydralazine 25 mg (1.5 tablets) three times daily, Entresto 97-103 twice daily, spironolactone 25 mg daily, and Coreg 25 mg twice daily.  -Coreg 25 mg held in the Long twice daily due to soft BP. BP nomal now. Start Coreg 6.25 BID.  -Hold all other BP meds due to GI bleed, higher risk for hypotension from blood loss  Prior to Admission Living Arrangement: Home Anticipated Discharge Location:Home Barriers to Discharge: Continued medical work up. Endoscopy tomorrow.  Dispo: Anticipate discharge 1-2 days.     Armando Reichert, MD 04/18/2021, 6:45 AM Pager: [phone] After 5pm on weekdays and 1pm on weekends: On Call pager [phone]

## 2021-04-18 NOTE — Progress Notes (Signed)
   04/18/21 0616  Vitals  Temp 98 F (36.7 C)  BP (!) 72/47  MAP (mmHg) (!) 57  BP Location Left Arm  BP Method Automatic  Patient Position (if appropriate) Lying  Pulse Rate 90  Pulse Rate Source Monitor  Resp 18  MEWS COLOR  MEWS Score Color Yellow  Oxygen Therapy  SpO2 100 %  O2 Device Room Air  Pain Assessment  Pain Scale 0-10  Pain Score 0  MEWS Score  MEWS Temp 0  MEWS Systolic 2  MEWS Pulse 0  MEWS RR 0  MEWS LOC 0  MEWS Score 2  Provider Notification  Provider Name/Title Shon Baton  Date Provider Notified 04/18/21  Time Provider Notified [phone]  Notification Type Page  Notification Reason Change in status  Provider response See new orders (discontinued lasix and coreg)  Date of Provider Response 04/18/21  Time of Provider Response [phone]

## 2021-04-18 NOTE — H&P (View-Only) (Signed)
[facility] Progress Note  Chief Complaint: Chronic blood loss anemia  History:  Signout received from Dr. Tarri Glenn and chart review performed.  Reports from colonoscopy and small bowel enteroscopy of this admission were personally reviewed.  I also read his video capsule study earlier today (see report below). This patient denies black or bloody stool, chest pain dyspnea or abdominal pain. Hypotensive earlier today, nursing reports that antihypertensive medicine has been held.   Objective:   Current Facility-Administered Medications:  .  acetaminophen (TYLENOL) tablet 1,000 mg, 1,000 mg, Oral, Q8H PRN, Maudie Mercury, MD .  albuterol (PROVENTIL) (2.5 MG/3ML) 0.083% nebulizer solution 2.5 mg, 2.5 mg, Nebulization, Q4H PRN, Maudie Mercury, MD .  atorvastatin (LIPITOR) tablet 40 mg, 40 mg, Oral, q1800, Maudie Mercury, MD, 40 mg at 04/17/21 1626 .  cyclobenzaprine (FLEXERIL) tablet 10 mg, 10 mg, Oral, TID PRN, Maudie Mercury, MD .  dapagliflozin propanediol (FARXIGA) tablet 10 mg, 10 mg, Oral, QAC breakfast, Maudie Mercury, MD, 10 mg at 04/18/21 6213 .  insulin aspart (novoLOG) injection 0-20 Units, 0-20 Units, Subcutaneous, Q4H, Maudie Mercury, MD, 4 Units at 04/18/21 1219 .  insulin glargine (LANTUS) injection 20 Units, 20 Units, Subcutaneous, Daily, Maudie Mercury, MD, 20 Units at 04/18/21 1021 .  pantoprazole (PROTONIX) EC tablet 40 mg, 40 mg, Oral, Daily, Vena Rua, PA-C, 40 mg at 04/18/21 1021 .  potassium chloride SA (KLOR-CON) CR tablet 40 mEq, 40 mEq, Oral, BID, Maudie Mercury, MD, 40 mEq at 04/18/21 1021 .  QUEtiapine (SEROQUEL) tablet 300 mg, 300 mg, Oral, QHS, Maudie Mercury, MD, 300 mg at 04/17/21 2141     Vital signs in last 24 hrs: Vitals:   04/18/21 1100 04/18/21 1303  BP: 126/60 140/68  Pulse: (!) 56 66  Resp: 18 19  Temp: 99.7 F (37.6 C) 98.7 F (37.1 C)  SpO2: 97% 94%    Intake/Output Summary (Last 24 hours) at 04/18/2021 1323 Last data filed at  04/18/2021 1305 Gross per 24 hour  Intake 1190 ml  Output 3000 ml  Net -1810 ml     Physical Exam Not acutely ill-appearing.  Sitting on edge of bed, pleasant and conversational and comfortable.  HEENT: sclera anicteric, oral mucosa without lesions  Neck: supple, no thyromegaly, JVD or lymphadenopathy  Cardiac: RRR without murmurs, S1S2 heard, no peripheral edema  Pulm: clear to auscultation bilaterally, normal RR and effort noted  Abdomen: soft, no tenderness, with active bowel sounds.  Difficult to assess mass or hepatosplenomegaly due to body habitus  Skin; warm and dry, no jaundice  Recent Labs:  CBC Latest Ref Rng & Units 04/18/2021 04/17/2021 04/16/2021  WBC 4.0 - 10.5 K/uL 6.3 5.8 5.9  Hemoglobin 13.0 - 17.0 g/dL 8.4(L) 7.9(L) 8.3(L)  Hematocrit 39.0 - 52.0 % 28.1(L) 25.8(L) 26.9(L)  Platelets 150 - 400 K/uL 309 283 256    No results for input(s): INR in the last 168 hours. CMP Latest Ref Rng & Units 04/18/2021 04/17/2021 04/16/2021  Glucose 70 - 99 mg/dL 151(H) 109(H) 109(H)  BUN 6 - 20 mg/dL 12 11 13   Creatinine 0.61 - 1.24 mg/dL 1.33(H) 1.09 1.18  Sodium 135 - 145 mmol/L 138 138 140  Potassium 3.5 - 5.1 mmol/L 4.3 4.0 3.7  Chloride 98 - 111 mmol/L 103 100 105  CO2 22 - 32 mmol/L 30 31 29   Calcium 8.9 - 10.3 mg/dL 8.4(L) 8.4(L) 8.5(L)  Total Protein 6.5 - 8.1 g/dL - - -  Total Bilirubin 0.3 - 1.2 mg/dL - - -  Alkaline Phos 38 - 126 U/L - - -  AST 15 - 41 U/L - - -  ALT 0 - 44 U/L - - -     Small bowel video capsule study shows rapid small bowel transit time of 52 minutes. There is a nonbleeding AVM seen just under 2 minutes from the first duodenal image.  Is difficult to know how far past the duodenum that may be given the rapid transit time. At least 1 colon polyp was also seen, limited colon visualization due to prep.  Assessment & Plan  Assessment:  Anemia chronic GI blood loss Heme positive stool Chronic oral anticoagulation use (has been held this  admission and not yet resumed)   Plan: The AVM seen on this capsule study may not have been visualized on the small bowel enteroscopy.  It is difficult to know if it is within reach of the enteroscope, but given the nature of these lesions and frequent difficulty localizing them behind mucosal folds, I think a repeat small bowel enteroscopy tomorrow is warranted.  I discussed this with him, and he was agreeable after discussion of procedure and risks.  (His nurse was present during that discussion)  The benefits and risks of the planned procedure were described in detail with the patient or (when appropriate) their health care proxy.  Risks were outlined as including, but not limited to, bleeding, infection, perforation, adverse medication reaction leading to cardiac or pulmonary decompensation, pancreatitis (if ERCP).  The limitation of incomplete mucosal visualization was also discussed.  No guarantees or warranties were given.  Patient at increased risk for cardiopulmonary complications of procedure due to medical comorbidities.   35 minutes were spent on this encounter (including chart review, history/exam, counseling/coordination of care, and documentation) > 50% of that time was spent on counseling and coordination of care.  Extensive chart review required.   [name] Office: [phone]

## 2021-04-18 NOTE — Consult Note (Signed)
   [facility] Inpatient Consult   04/18/2021  GORJE IYER 1961-02-16 [id number]  [place] [facility] [ACO] Patient: Sherre Poot [facility] MA  Patient currently as active with [facility] as of 03/27/2021 [not currently a [facility] insurance]  Patient had been active with [facility] for chronic disease management services.  Patient has been engaged by a [facility]. Chart reviewed briefly and patient is followed by Heart and Vascular paramedicine team noted. .   Plan: Updated [facility] RN CC of insurance change and awaiting follow up information. Follow up for [facility] eligibility with insurance changes noted. Came by to speak with patient he is currently receiving care, will continue to follow up.   Of note, [facility] Care Management services does not replace or interfere with any services that are needed or arranged by inpatient [facility] care management team.  For additional questions or referrals please contact:  Natividad Brood, RN BSN [facility] Liaison  [phone] business mobile phone Toll free office [phone]  Fax number: [phone] [email or web address]

## 2021-04-18 NOTE — Anesthesia Preprocedure Evaluation (Addendum)
Anesthesia Evaluation  Patient identified by MRN, date of birth, ID band Patient awake    Reviewed: Allergy & Precautions, NPO status , Patient's Chart, lab work & pertinent test results, reviewed documented beta blocker date and time   History of Anesthesia Complications Negative for: history of anesthetic complications  Airway Mallampati: II  TM Distance: >3 FB Neck ROM: Full    Dental  (+) Edentulous Upper, Missing,    Pulmonary sleep apnea and Continuous Positive Airway Pressure Ventilation , former smoker,    Pulmonary exam normal        Cardiovascular hypertension, Pt. on medications and Pt. on home beta blockers + CAD and +CHF  Normal cardiovascular exam+ dysrhythmias (on Eliquis) Atrial Fibrillation + Cardiac Defibrillator   TTE 03/2020: EF 30%, global hypokinesis, mild LVH, grade II DD, RV systolic function mildly reduced, mild RVE,  mildly elevated PASP 37.7 mmHg, mild LAE/RAE   Neuro/Psych Anxiety Depression Schizophrenia negative neurological ROS     GI/Hepatic Neg liver ROS, GERD  Medicated and Controlled,GIB   Endo/Other  diabetes, Type 2, Oral Hypoglycemic Agents, Insulin DependentMorbid obesity  Renal/GU Renal Insufficiency and ARFRenal disease  negative genitourinary   Musculoskeletal negative musculoskeletal ROS (+)   Abdominal   Peds  Hematology  (+) anemia , Hgb 8.4   Anesthesia Other Findings Day of surgery medications reviewed with patient.  Reproductive/Obstetrics negative OB ROS                            Anesthesia Physical Anesthesia Plan  ASA: IV  Anesthesia Plan: MAC   Post-op Pain Management:    Induction:   PONV Risk Score and Plan: Treatment may vary due to age or medical condition and Propofol infusion  Airway Management Planned: Natural Airway and Nasal Cannula  Additional Equipment: None  Intra-op Plan:   Post-operative Plan:   Informed  Consent: I have reviewed the patients History and Physical, chart, labs and discussed the procedure including the risks, benefits and alternatives for the proposed anesthesia with the patient or authorized representative who has indicated his/her understanding and acceptance.       Plan Discussed with: CRNA  Anesthesia Plan Comments:        Anesthesia Quick Evaluation

## 2021-04-19 ENCOUNTER — Encounter (HOSPITAL_COMMUNITY)
Admission: EM | Disposition: A | Discharge status: Home / Self Care | Payer: Self-pay | Source: Home / Self Care | Attending: Internal Medicine

## 2021-04-19 ENCOUNTER — Inpatient Hospital Stay (HOSPITAL_COMMUNITY): Payer: Medicare (Managed Care) | Admitting: Anesthesiology

## 2021-04-19 ENCOUNTER — Encounter (HOSPITAL_COMMUNITY): Payer: Self-pay | Admitting: Internal Medicine

## 2021-04-19 ENCOUNTER — Other Ambulatory Visit: Payer: Self-pay

## 2021-04-19 DIAGNOSIS — K31811 Angiodysplasia of stomach and duodenum with bleeding: Secondary | ICD-10-CM | POA: Diagnosis not present

## 2021-04-19 HISTORY — PX: ENTEROSCOPY: SHX5533

## 2021-04-19 HISTORY — PX: SUBMUCOSAL TATTOO INJECTION: SHX6856

## 2021-04-19 HISTORY — PX: BIOPSY: SHX5522

## 2021-04-19 LAB — GLUCOSE, CAPILLARY
Glucose-Capillary: 106 mg/dL — ABNORMAL HIGH (ref 70–99)
Glucose-Capillary: 124 mg/dL — ABNORMAL HIGH (ref 70–99)
Glucose-Capillary: 127 mg/dL — ABNORMAL HIGH (ref 70–99)

## 2021-04-19 LAB — CBC
HCT: 27 % — ABNORMAL LOW (ref 39.0–52.0)
Hemoglobin: 8.3 g/dL — ABNORMAL LOW (ref 13.0–17.0)
MCH: 27.5 pg (ref 26.0–34.0)
MCHC: 30.7 g/dL (ref 30.0–36.0)
MCV: 89.4 fL (ref 80.0–100.0)
Platelets: 270 10*3/uL (ref 150–400)
RBC: 3.02 MIL/uL — ABNORMAL LOW (ref 4.22–5.81)
RDW: 15.9 % — ABNORMAL HIGH (ref 11.5–15.5)
WBC: 7 10*3/uL (ref 4.0–10.5)
nRBC: 0 % (ref 0.0–0.2)

## 2021-04-19 LAB — BASIC METABOLIC PANEL
Anion gap: 7 (ref 5–15)
BUN: 13 mg/dL (ref 6–20)
CO2: 28 mmol/L (ref 22–32)
Calcium: 8.3 mg/dL — ABNORMAL LOW (ref 8.9–10.3)
Chloride: 103 mmol/L (ref 98–111)
Creatinine, Ser: 1.22 mg/dL (ref 0.61–1.24)
GFR, Estimated: >60 mL/min (ref >60)
Glucose, Bld: 116 mg/dL — ABNORMAL HIGH (ref 70–99)
Potassium: 4.4 mmol/L (ref 3.5–5.1)
Sodium: 138 mmol/L (ref 135–145)

## 2021-04-19 LAB — SURGICAL PATHOLOGY

## 2021-04-19 SURGERY — ENTEROSCOPY
Anesthesia: Monitor Anesthesia Care

## 2021-04-19 MED ORDER — SPOT INK MARKER SYRINGE KIT
PACK | SUBMUCOSAL | Status: AC
  Filled 2021-04-19: qty 5

## 2021-04-19 MED ORDER — FUROSEMIDE 40 MG PO TABS
40.0000 mg | ORAL_TABLET | Freq: Two times a day (BID) | ORAL | Status: DC
  Administered 2021-04-19: 40 mg via ORAL
  Filled 2021-04-19: qty 1

## 2021-04-19 MED ORDER — POTASSIUM CHLORIDE CRYS ER 20 MEQ PO TBCR
40.0000 meq | EXTENDED_RELEASE_TABLET | Freq: Every day | ORAL | 0 refills | Status: DC
  Filled 2021-04-19 – 2021-04-26 (×2): qty 2, 1d supply, fill #0

## 2021-04-19 MED ORDER — CARVEDILOL 6.25 MG PO TABS
6.2500 mg | ORAL_TABLET | Freq: Two times a day (BID) | ORAL | Status: DC
  Filled 2021-04-19: qty 1

## 2021-04-19 MED ORDER — FERROUS SULFATE 325 (65 FE) MG PO TABS: 325.0000 mg | ORAL_TABLET | ORAL | 0 refills | Status: DC

## 2021-04-19 MED ORDER — LACTATED RINGERS IV SOLN: INTRAVENOUS | Status: DC | PRN

## 2021-04-19 MED ORDER — CARVEDILOL 6.25 MG PO TABS
6.2500 mg | ORAL_TABLET | Freq: Two times a day (BID) | ORAL | 0 refills | Status: DC
  Filled 2021-04-19 – 2021-04-26 (×2): qty 60, 30d supply, fill #0

## 2021-04-19 MED ORDER — PANTOPRAZOLE SODIUM 40 MG PO TBEC
40.0000 mg | DELAYED_RELEASE_TABLET | Freq: Every day | ORAL | 0 refills | Status: DC
  Filled 2021-04-19 – 2021-04-26 (×2): qty 30, 30d supply, fill #0

## 2021-04-19 MED ORDER — SPOT INK MARKER SYRINGE KIT
PACK | SUBMUCOSAL | Status: DC | PRN
  Administered 2021-04-19: 1 mL via SUBMUCOSAL

## 2021-04-19 MED ORDER — LIDOCAINE 2% (20 MG/ML) 5 ML SYRINGE
INTRAMUSCULAR | Status: DC | PRN
  Administered 2021-04-19: 60 mg via INTRAVENOUS

## 2021-04-19 MED ORDER — GLUCAGON HCL RDNA (DIAGNOSTIC) 1 MG IJ SOLR
INTRAMUSCULAR | Status: DC | PRN
  Administered 2021-04-19: .5 mg via INTRAVENOUS

## 2021-04-19 MED ORDER — FUROSEMIDE 40 MG PO TABS
40.0000 mg | ORAL_TABLET | Freq: Two times a day (BID) | ORAL | 0 refills | Status: DC
  Filled 2021-04-19 – 2021-05-03 (×3): qty 60, 30d supply, fill #0

## 2021-04-19 MED ORDER — ACETAMINOPHEN 500 MG PO TABS
1000.0000 mg | ORAL_TABLET | Freq: Two times a day (BID) | ORAL | 3 refills | Status: DC
  Filled 2021-04-19: qty 180, 45d supply, fill #0

## 2021-04-19 MED ORDER — PROPOFOL 500 MG/50ML IV EMUL
INTRAVENOUS | Status: DC | PRN
  Administered 2021-04-19: 125 ug/kg/min via INTRAVENOUS

## 2021-04-19 NOTE — Anesthesia Procedure Notes (Signed)
Procedure Name: MAC Date/Time: 04/19/2021 9:02 AM Performed by: Kyung Rudd, CRNA Pre-anesthesia Checklist: Patient identified, Emergency Drugs available, Suction available and Patient being monitored Patient Re-evaluated:Patient Re-evaluated prior to induction Oxygen Delivery Method: Nasal cannula Induction Type: IV induction Placement Confirmation: positive ETCO2 Dental Injury: Teeth and Oropharynx as per pre-operative assessment

## 2021-04-19 NOTE — Interval H&P Note (Signed)
History and Physical Interval Note:  04/19/2021 8:51 AM  [name]  has presented today for surgery, with the diagnosis of chronic blood loss anemia.  The various methods of treatment have been discussed with the patient and family. After consideration of risks, benefits and other options for treatment, the patient has consented to  Procedure(s): ENTEROSCOPY (N/A) as a surgical intervention.  The patient's history has been reviewed, patient examined, no change in status, stable for surgery.  I have reviewed the patient's chart and labs.  Questions were answered to the patient's satisfaction.    Hgb stable today  [name]

## 2021-04-19 NOTE — Discharge Instructions (Signed)
Dear [name],   Thank you for letting us participate in your care! In this section, you will find a brief hospital admission summary of why you were admitted to the hospital, what happened during your admission, your diagnosis/diagnoses, and recommended follow up.   You were admitted because you were experiencing breathing difficulty, chest pain and weight gain and GI bleeding .   Your testing revealed acute exacerbation of heart failure and anemia.   You were diagnosed with acute exacerbation of heart failure and gastrojejunal ulcer .  You were treated with diuretics, blood, Iv Iron and protonix.   You were also seen by Gastroenterologist. They recommended Endoscopy, colonoscopy, pill endoscopy.   Your symptoms improved and you were discharged from the hospital for meeting this goal.   POST-HOSPITAL & CARE INSTRUCTIONS 1. Follow up with PCP in 1 week.  2. We held some of your BP medications. Please let PCP/Specialists know of any changes in medications that were made. This has to be reassessed at follow up visit. 3. Start Eliquis from tomorrow 04/20/21. 4. Outpatient hematology follow up for close monitoring of CBC and iron levels with periodic IV iron treatments. 5. Please see medications section of this packet for any medication changes.     Thank you for choosing [facility]! Take care and be well!  [facility]  [address] [place], [place] [address] [phone]

## 2021-04-19 NOTE — Progress Notes (Signed)
Subjective: Pt observed in room sleeping comfortably.  No respiratory discomfort observed.  Breathing comfortably at room air.  Peripheral edema much improved.  Objective:  Vital signs in last 24 hours: Vitals:   04/18/21 1100 04/18/21 1303 04/18/21 2020 04/19/21 0406  BP: 126/60 140/68 (!) 126/57 (!) 100/57  Pulse: (!) 56 66 (!) 57 66  Resp: 18 19 18 20   Temp: 99.7 F (37.6 C) 98.7 F (37.1 C) 100 F (37.8 C) 98 F (36.7 C)  TempSrc: Oral Oral Oral Oral  SpO2: 97% 94% 91% 90%  Weight:      Height:       CBC Latest Ref Rng & Units 04/19/2021 04/18/2021 04/17/2021  WBC 4.0 - 10.5 K/uL 7.0 6.3 5.8  Hemoglobin 13.0 - 17.0 g/dL 8.3(L) 8.4(L) 7.9(L)  Hematocrit 39.0 - 52.0 % 27.0(L) 28.1(L) 25.8(L)  Platelets 150 - 400 K/uL 270 309 283   CMP Latest Ref Rng & Units 04/19/2021 04/18/2021 04/17/2021  Glucose 70 - 99 mg/dL 116(H) 151(H) 109(H)  BUN 6 - 20 mg/dL 13 12 11   Creatinine 0.61 - 1.24 mg/dL 1.22 1.33(H) 1.09  Sodium 135 - 145 mmol/L 138 138 138  Potassium 3.5 - 5.1 mmol/L 4.4 4.3 4.0  Chloride 98 - 111 mmol/L 103 103 100  CO2 22 - 32 mmol/L 28 30 31   Calcium 8.9 - 10.3 mg/dL 8.3(L) 8.4(L) 8.4(L)  Total Protein 6.5 - 8.1 g/dL - - -  Total Bilirubin 0.3 - 1.2 mg/dL - - -  Alkaline Phos 38 - 126 U/L - - -  AST 15 - 41 U/L - - -  ALT 0 - 44 U/L - - -    Physical Exam Constitutional:      General: He is not in acute distress.    Appearance: He is obese. He is not ill-appearing, toxic-appearing or diaphoretic.     Comments: Sleeping comfortably in bed. Not in acute distress.   HENT:     Head: Normocephalic.  Pulmonary:     Effort: Pulmonary effort is normal.     Comments: Breathing comfortably at room air. No difficulty in breathing observed.  Musculoskeletal:     Comments: Swelling much improved in b/l lower extremities. Skin less tense than before.    Skin:    General: Skin is warm and dry.    Assessment/Plan:  Active Problems:   Angiodysplasia of duodenum  with hemorrhage   CHF exacerbation (HCC)   GI bleeding   Adenomatous polyp of ascending colon  [name] is a 60 y.o. 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   #Acute on chronic HFrEF (ejection fraction 30%)exacerbation  HFrEF exacerbation likely in the setting of increased salt intake and eating fast food.  Edema much improved.  Had been on Lasix for diuresis. Net loss since yesterday -630 (1320-1950), Net loss since admit -7607. Pt's BP soft in the morning 100/57 mmHg.  Weight 155.6 (on 5/20), Weight 155 kg today. -- BP soft now. Hold lasix in the morning. -Trend BMP.  Trend BMP. -Continue strict I's and O's. -Daily weights - Replenish K as needed >4.0 and Mg >2 as needed  -Continue Farxiga 10 mg daily. - Hold Coreg in the morning.   #GI bleed (source unknown) #Iron deficiency anemia    No bleeding reported yesterday.  S/p 2 PRBCs in ED and 1 Feraheme infusion on 5/20. Hb 8.3 stable.  Underwent pill endoscopy 5/22 which shows non bleeding AVM (2 mins from duodenum. GI doing repeat small bowel enteroscopy today. -Small bowel enteroscopy today.  -Trend CBC.  -Continue Protonix 40 mg twice daily -Continue to hold Eliquis due to GI bleed.  #Ischemic Cardiomyopathy with ICD placement  -Continue atorvastatin 40 mg daily.  -Continue to hold blood pressure medications given GI bleed.  -Cardiac monitoring   #Diabetes Mellitus  Last measured A1c 7.1% 03/2021. Home regimen includes metformin 500 mg twice daily, Farxiga 10 mg daily, glipizide 10 mg twice daily, Lantus 20 units daily, and Humalog 30 units twice daily. BG 124 this morning.  -Continue Farxiga 10 mg daily. -Holding metformin and glipizide. -Lantus 20 units daily. -Resistant SSI for correctional insulin.  #Paroxysmal  atrial fibrillation (was on Eliquis) Home meds includes Eliquis 5 mg twice daily.  -Continue to hold Eliquis given GI bleed.  -Holding Coreg this morning due to low blood pressure.  #AKI (resolved)  creatinine at baseline at 1.22.  GFR >60. Renal function at baseline. -Monitor BMP   #Obstructive Sleep Apnea  -CPAP nightly if available   #Hypertension  Home blood pressure medications include hydralazine 25 mg (1.5 tablets) three times daily, Entresto 97-103 twice daily, spironolactone 25 mg daily, and Coreg 25 mg twice daily.  -hold coreg due to low BP. -Hold all other BP meds due to GI bleed, higher risk of hypotension from blood loss. Will hold these meds at discharge. Need to be reassessed at follow up appointment.    Prior to Admission Living Arrangement: Home Anticipated Discharge Location:Home Barriers to Discharge: Endoscopy today.  Dispo: Anticipate discharge today after endoscopy.    Armando Reichert, MD 04/19/2021, 6:24 AM Pager: [phone] After 5pm on weekdays and 1pm on weekends: On Call pager [phone]

## 2021-04-19 NOTE — Discharge Summary (Signed)
Name: [name] MRN: [id number] DOB: [birth year] 60 y.o. PCP: Azzie Glatter, FNP (Inactive)  Date of Admission: 04/13/2021 12:46 PM Date of Discharge:  04/19/21 Attending Physician: Angelica Pou, MD   Discharge Diagnosis: Acute on chronic HFrEF (LVEF 30%) exacerbation  Non bleeding Gastrojejunal ulcer Iron deficiency anemia   Ischemic Cardiomyopathy with ICD placement  Type 2DM Paroxysmal atrial fibrillation (on Eliquis) AKI (resolved) OSA HTN  Discharge Medications: Allergies as of 04/19/2021   No Known Allergies     Medication List    STOP taking these medications   buPROPion 100 MG 12 hr tablet Commonly known as: WELLBUTRIN SR   Entresto 97-103 MG Generic drug: sacubitril-valsartan   hydrALAZINE 25 MG tablet Commonly known as: APRESOLINE   isosorbide mononitrate 30 MG 24 hr tablet Commonly known as: IMDUR   spironolactone 25 MG tablet Commonly known as: ALDACTONE   Viagra 25 MG tablet Generic drug: sildenafil     TAKE these medications   acetaminophen 500 MG tablet Commonly known as: TYLENOL Take 2 tablets (1,000 mg total) by mouth in the morning and at bedtime.   albuterol 108 (90 Base) MCG/ACT inhaler Commonly known as: VENTOLIN HFA INHALE 2 PUFFS INTO THE LUNGS EVERY 6 (SIX) HOURS AS NEEDED FOR WHEEZING OR SHORTNESS OF BREATH. What changed: Another medication with the same name was removed. Continue taking this medication, and follow the directions you see here.   atorvastatin 40 MG tablet Commonly known as: LIPITOR Take 1 tablet (40 mg total) by mouth daily at 6 PM.   blood glucose meter kit and supplies Kit Dispense based on patient and insurance preference. Use up to four times daily as directed. (FOR ICD-9 250.00, 250.01).   carvedilol 6.25 MG tablet Commonly known as: COREG Take 1 tablet (6.25 mg total) by mouth 2 (two) times daily with a meal. What changed:   medication strength  how much to take   Centrum  Silver 50+Men Tabs Take 1 tablet by mouth daily.   cyclobenzaprine 10 MG tablet Commonly known as: FLEXERIL Take 10 mg by mouth 3 (three) times daily as needed for muscle spasms.   Eliquis 5 MG Tabs tablet Generic drug: apixaban Take 1 tablet (5 mg total) by mouth 2 (two) times daily.   Farxiga 10 MG Tabs tablet Generic drug: dapagliflozin propanediol Take 1 tablet (10 mg total) by mouth daily before breakfast.   ferrous sulfate 325 (65 FE) MG tablet Take 1 tablet (325 mg total) by mouth every other day. What changed: when to take this   FreeStyle Libre [place] 1 each by Does not apply route as needed.   furosemide 40 MG tablet Commonly known as: LASIX Take 1 tablet (40 mg total) by mouth 2 (two) times daily. What changed:   medication strength  See the new instructions.   gabapentin 300 MG capsule Commonly known as: NEURONTIN TAKE 2 CAPSULES (TOTAL = 600 MG), BY MOUTH, 3 TIMES A DAY. What changed:   how much to take  how to take this  when to take this   glipiZIDE 10 MG tablet Commonly known as: GLUCOTROL TAKE 1 TABLET (10 MG TOTAL) BY MOUTH 2 (TWO) TIMES DAILY BEFORE A MEAL. What changed: how much to take   Insulin Lispro Prot & Lispro (75-25) 100 UNIT/ML Kwikpen Commonly known as: HumaLOG Mix 75/25 KwikPen Inject 30 Units into the skin 2 (two) times daily.   Lantus 100 UNIT/ML injection Generic drug: insulin glargine INJECT 0.2 MLS (  20 UNITS TOTAL) INTO THE SKIN DAILY. What changed:   how much to take  how to take this  when to take this   metFORMIN 500 MG tablet Commonly known as: GLUCOPHAGE TAKE 1 TABLET (500 MG TOTAL) BY MOUTH 2 (TWO) TIMES DAILY WITH A MEAL. What changed: how much to take   pantoprazole 40 MG tablet Commonly known as: PROTONIX Take 1 tablet (40 mg total) by mouth daily. What changed:   how much to take  when to take this   potassium chloride SA 20 MEQ tablet Commonly known as: KLOR-CON Take 2 tablets (40  mEq total) by mouth daily.   QUEtiapine 300 MG tablet Commonly known as: SEROQUEL TAKE 1 TABLET BY MOUTH AT BEDTIME. What changed: how much to take   tetrahydrozoline 0.05 % ophthalmic solution Place 1 drop into both eyes daily.   TRUEplus Insulin Syringe 31G X 5/16" 0.3 ML Misc Generic drug: Insulin Syringe-Needle U-100 USE 3 TIMES DAILY TO INJECT INSULIN   TRUEplus Pen Needles 31G X 6 MM Misc Generic drug: Insulin Pen Needle USE AS DIRECTED   TRUEplus Pen Needles 31G X 6 MM Misc Generic drug: Insulin Pen Needle USE AS DIRECTED       Disposition and follow-up:   [name] was discharged from [facility] in Stable condition.  At the hospital follow up visit please address:  1.  Follow up: . With PCP in 1 week . With Cardiology in 1 week.  . Outpatient hematology referral and follow up for close monitoring of CBC and iron levels with periodic IV iron treatments.  2.  Labs / imaging needed at time of follow-up: CBC, Iron levels, BMP.   3.  Pending labs/ test needing follow-up: GI Biopsy pathology   Follow-up Appointments:  Follow-up Information    Azzie Glatter, FNP. Schedule an appointment as soon as possible for a visit in 1 week(s).   Specialty: Family Medicine Why: Follow up with PCP in 1 week. Make appointment.  Contact information: [place] [address] [phone]        [name], MD .   Specialty: Cardiology Contact information: [phone] [address] [place] [address] [phone]        [name], MD .   Specialty: Cardiology Contact information: [place] [address] [phone]        [name], MD .   Specialties: Cardiology, Radiology Contact information: [place] [address] [place] [facility] Course by problem list: #Acute on chronic HFrEF (EF 30%) exacerbation  Patient  received IV lasix 40 mg in ED on day of admission (5/18) and then another 80 mg, with adequate urine output. Continued diuresis with IV lasix 80 mg twice daily and monitored urine output. On admission weight was 343 lbs and dry weight seems to be around 321 lbs. On discharge weight was 341 .7. BMP trended and creatinine on admission was 1.46 and downtrended with diuresis. On discharge Cr at baseline at 1.22 . BP soft in the morning of 5/23, Coreg stopped but started again and discharged  at 6.25 mg BID. This has to be reassesed at follow up visit.   #Non bleeding Gastrojejunal ulcer  # Iron deficiency Anemia  Patient has a history of upper GI bleed in 05/2020 and is s/p APC of multiple  angiodysplastic lesions in the second and third portion of the duodenum. Two weeks of bloody stools, most likely due to GI bleed. Last dose Eliquis was AM of 5/18 and hemoglobin was found to 6.9 on admission. GI consulted. 2 units pRBCs transfused and hemoglobin up to 8.3. Iron studies were checked: iron of 25 and ferritin of 11. On 5/20 received Ferraheme transfusion given. Enteroscopy performed on 5/20 which showed 2 very small angioectasias with no bleeding in the third portion of the duodenum. GI recommended colonoscopy and scheduled for 5/21, which showed multiple polyps which were biopsied. No source of bleeding found. Underwent pill endoscopy 5/22 which showed non bleeding AVM. Pt undergone repeat enteroscopy which showed Non bleeding jejunal ulcer. Biopsy pathology results pending. Recommended to follow up with hematology follow up for close monitoring of CBC and iron levels with periodic IV iron treatments.    #Ischemic Cardiomyopathy with ICD placement  #Elevated troponin (resolved)  EKG normal sinus rhythm, T wave inversions in leads I, III, and avR. Consistent with prior EKG in March 2022. Trops were flat (198 >200) low concern for ACS. Continued atorvastatin 40 mg daily along with cardiac monitoring. Held blood  pressure medications given GI bleed.   #Diabetes Mellitus  Continued Farxiga 10 mg daily. Held metformin and glipizide during admission. Inpatient regimen was Lantus 20 units daily and resistant SSI for correctional insulin. Restarted home meds at discharge.    #Paroxysmal atrial fibrillation  Eliquis held during this admission due to GI bleed. Continued Coreg 25 mg twice daily. BP soft in the morning of 5/23, Coreg stopped but restarted again at 6.25 mg BID. Heart rate intermittently irregular.  Eliquis restarted at discharge from 5/25. #AKI (resolved) Creatinine 1.46 with GFR 55 at admission which returned to its baseline (1.22 )at discharge. Baseline (between 1.1-1.2)    #Obstructive Sleep Apnea  He uses CPAP machine at night. Recommended to use CPAP nightly   #Hypertension  Home blood pressure medications include hydralazine 25 mg (1.5 tablets) three times daily, Entresto 97-103 twice daily, spironolactone 25 mg daily, and Coreg 25 mg twice daily. Continude Coreg 25 mg twice daily initially which was reduced to 6.25 bid due to low BP.  Held all other BP meds due to GI bleed. Need to be reassessed at follow up appointment.   Subjective on day of discharge: Pt is seen this morning after endoscopy. Pt states he feels much better with breathing and denies SOB. Cough has also improved. Discussed results of Enteroscopy. Told Pt that he can start Eliquis tommrow. Answered all questions and concerns. Discussed that we held some of his BP meds and he needs to follow up with PCP for reassessment in 1 week. He agrees with plan.   Discharge Exam:   BP (!) 144/100 (BP Location: Left Arm)   Pulse 65   Temp (!) 97.4 F (36.3 C) (Oral)   Resp 20   Ht '6\' 2"'  (1.88 m)   Wt (!) 341 lb 11.4 oz (155 kg)   SpO2 96%   BMI 43.87 kg/m  Discharge exam: Physical Exam Vitals and nursing note reviewed.  Constitutional:      General: He is not in acute distress.    Appearance: He is obese. He is not  ill-appearing, toxic-appearing or diaphoretic.  HENT:     Head: Normocephalic.  Cardiovascular:     Rate and Rhythm: Normal rate and regular rhythm.     Pulses: Normal pulses.  Pulmonary:     Effort: Pulmonary effort is  normal. No respiratory distress.     Breath sounds: Normal breath sounds. No stridor. No wheezing, rhonchi or rales.     Comments: Breathing comfortably at room air, no respiratory distress noted. Skin:    General: Skin is warm and dry.     Comments: Swelling much improved in b/l lower extremities. Skin less tense than before.   Neurological:     Mental Status: He is alert and oriented to person, place, and time.  Psychiatric:        Mood and Affect: Mood normal.        Behavior: Behavior normal.   Pertinent Labs, Studies, and Procedures:  DG Chest 2 View  Result Date: 04/13/2021 CLINICAL DATA:  Shortness of breath with leg swelling. History of CHF. EXAM: CHEST - 2 VIEW COMPARISON:  None. FINDINGS: Enlarged cardiac silhouette. Single lead left subclavian approach cardiac rhythm maintenance device with tip not imaged. Pulmonary vascular congestion. Hazy bilateral perihilar and bibasilar airspace opacities. No visible pleural effusions or pneumothorax. IMPRESSION: Cardiomegaly and pulmonary vascular congestion. Hazy bilateral perihilar and bibasilar opacities may represent mild edema. Infection is a less likely differential consideration. Electronically Signed   By: Margaretha Sheffield MD   On: 04/13/2021 14:08   Discharge Instructions: Discharge Instructions    Call MD for:  difficulty breathing, headache or visual disturbances   Complete by: As directed    Call MD for:  difficulty breathing, headache or visual disturbances   Complete by: As directed    Call MD for:  extreme fatigue   Complete by: As directed    Call MD for:  extreme fatigue   Complete by: As directed    Call MD for:  persistant dizziness or light-headedness   Complete by: As directed    Call MD for:   temperature >100.4   Complete by: As directed    Call MD for:  temperature >100.4   Complete by: As directed    Diet - low sodium heart healthy   Complete by: As directed    Diet Carb Modified   Complete by: As directed    Increase activity slowly   Complete by: As directed    Increase activity slowly   Complete by: As directed      Dear [name],   Thank you for letting us participate in your care! In this section, you will find a brief hospital admission summary of why you were admitted to the hospital, what happened during your admission, your diagnosis/diagnoses, and recommended follow up.   You were admitted because you were experiencing breathing difficulty, chest pain and weight gain and GI bleeding .   Your testing revealed acute exacerbation of heart failure and anemia.   You were diagnosed with acute exacerbation of heart failure and gastrojejunal ulcer .  You were treated with diuretics, blood, Iv Iron and protonix.   You were also seen by Gastroenterologist. They recommended Endoscopy, colonoscopy, pill endoscopy.   Your symptoms improved and you were discharged from the hospital for meeting this goal.   POST-HOSPITAL & CARE INSTRUCTIONS 1. Follow up with PCP in 1 week.  2. We held some of your BP medications. Please let PCP/Specialists know of any changes in medications that were made. This has to be reassessed at follow up visit. 3. Start Eliquis from tomorrow 04/20/21. 4. Outpatient hematology follow up for close monitoring of CBC and iron levels with periodic IV iron treatments. 5. Please see medications section of this packet for any medication  changes.   DOCTOR'S APPOINTMENTS & FOLLOW UP Future Appointments  Date Time Provider [place]  05/11/2021  3:00 PM [facility] PA/NP [facility] None  06/03/2021  7:40 AM [facility] DEVICE REMOTES [facility]  09/02/2021  7:40 AM [facility] DEVICE REMOTES [facility]  09/30/2021   1:20 PM [name], NP [facility] None  12/02/2021  7:40 AM [facility] DEVICE REMOTES [facility]  03/03/2022  7:40 AM [facility] DEVICE REMOTES [facility]   Thank you for choosing [facility]! Take care and be well!  Internal Medicine Teaching Service Inpatient Team [facility]  [address] [place], [place] [address] [phone]  Signed: Armando Reichert, MD 04/19/2021, 11:08 AM   Pager: [phone]

## 2021-04-19 NOTE — Op Note (Signed)
[facility] Patient Name: [name] Procedure Date : 04/19/2021 MRN: [id number] Attending MD: [name] , MD Date of Birth: May 02, 1961 CSN: [id number] Age: 60 Admit Type: Inpatient Procedure:                Small bowel enteroscopy Indications:              Abnormal video capsule endoscopy, Iron deficiency                            anemia secondary to chronic blood loss, Occult                            blood in stool                           colonoscopy and SBE with AVM APC treatment this                            admission. non-bleeding AVM seen on susbequent VCE Providers:                Joel Mussel L. Loletha Carrow, MD, Joel Lawman, RN, [name], Technician, Joel Lerner, CRNA Referring MD:              Medicines:                Monitored Anesthesia Care, Glucagon 0.5 mg IV Complications:            No immediate complications. Estimated Blood Loss:     Estimated blood loss was minimal. Procedure:                Pre-Anesthesia Assessment:                           - Prior to the procedure, a History and Physical                            was performed, and patient medications and                            allergies were reviewed. The patient's tolerance of                            previous anesthesia was also reviewed. The risks                            and benefits of the procedure and the sedation                            options and risks were discussed with the patient.                            All questions were answered, and informed consent  was obtained. Prior Anticoagulants: The patient has                            taken Eliquis (apixaban), last dose was 5 days                            prior to procedure. ASA Grade Assessment: IV - A                            patient with severe systemic disease that is a                            constant threat to life. After reviewing the risks                             and benefits, the patient was deemed in                            satisfactory condition to undergo the procedure.                           After obtaining informed consent, the endoscope was                            passed under direct vision. Throughout the                            procedure, the patient's blood pressure, pulse, and                            oxygen saturations were monitored continuously. The                            PCF-H190DL ([id number] Olympus pediatric colonscope                            was introduced through the mouth and advanced to                            the small bowel distal to the Ligament of Treitz.                            The small bowel enteroscopy was performed with                            difficulty due to the patient's body habitus,                            abdominal breathing. The patient tolerated the                            procedure. Scope In: Scope Out: Findings:      The esophagus was normal.  The stomach was normal.      The examined duodenum was normal.      One non-bleeding superficial jejunal ulcer with no stigmata of bleeding       was found in the proximal jejunum. The lesion was 6 mm in largest       dimension. Biopsies were taken with a cold forceps for histology. Impression:               - Normal esophagus.                           - Normal stomach.                           - Normal examined duodenum.                           - Non-bleeding jejunal ulcer with no stigmata of                            bleeding Biopsied. Tattooed.                           Ulcer of unclear cause. Patient not on aspirin. Any                            NSAIDs should be discontinued. Does not look                            typical for effect of APC from recent enteroscopy.                           AVM seen on VCE not visualized on this procedure                            (from technical  limitations of SBE, lesions beyond                            scope reach, or both) Recommendation:           - Return patient to hospital ward for ongoing care.                           - Await pathology results.                           - Resume regular diet.                           - Resume Eliquis (apixaban) at prior dose tomorrow.                           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 The AVMs are difficult to                            localize on enteroscopy and some are beyond the                            reach of the scope.                           - He needs outpatient hematology follow up for                            close monitoring of CBC and iron levels with                            periodic IV iron treatments.                           As-needed GI office follow up. Inpatient GI service                            signing off. Patient can be discharged any time                            from GI perspective. Procedure Code(s):        --- Professional ---                           [phone], Small intestinal endoscopy, enteroscopy                            beyond second portion of duodenum, not including                            ileum; with biopsy, single or multiple Diagnosis Code(s):        --- Professional ---                           K28.9, Gastrojejunal ulcer, unspecified as acute or                            chronic, without hemorrhage or perforation                           D50.0, Iron deficiency anemia secondary to blood                            loss (chronic)                           R19.5, Other fecal abnormalities                           R93.3, Abnormal findings on diagnostic imaging of  other parts of digestive tract CPT copyright 2019 American Medical Association. All rights  reserved. The codes documented in this report are preliminary and upon coder review may  be revised to meet current compliance requirements. Joel Rio L. Loletha Carrow, MD 04/19/2021 9:48:50 AM This report has been signed electronically. Number of Addenda: 0

## 2021-04-19 NOTE — Anesthesia Postprocedure Evaluation (Signed)
Anesthesia Post Note  Patient: [name]  Procedure(s) Performed: ENTEROSCOPY (N/A ) BIOPSY SUBMUCOSAL TATTOO INJECTION     Patient location during evaluation: PACU Anesthesia Type: MAC Level of consciousness: awake and alert and oriented Pain management: pain level controlled Vital Signs Assessment: post-procedure vital signs reviewed and stable Respiratory status: spontaneous breathing, nonlabored ventilation and respiratory function stable Cardiovascular status: blood pressure returned to baseline Postop Assessment: no apparent nausea or vomiting Anesthetic complications: no   No complications documented.  Last Vitals:  Vitals:   04/19/21 0949 04/19/21 1000  BP: (!) 144/71 (!) 170/69  Pulse: 79 66  Resp: 17 (!) 8  Temp: (!) 36.1 C   SpO2: 96% 100%    Last Pain:  Vitals:   04/19/21 1000  TempSrc:   PainSc: 0-No pain                 Brennan Bailey

## 2021-04-19 NOTE — Transfer of Care (Signed)
Immediate Anesthesia Transfer of Care Note  Patient: [name]  Procedure(s) Performed: ENTEROSCOPY (N/A ) BIOPSY SUBMUCOSAL TATTOO INJECTION  Patient Location: Endoscopy Unit  Anesthesia Type:MAC  Level of Consciousness: awake, alert  and oriented  Airway & Oxygen Therapy: Patient Spontanous Breathing and Patient connected to nasal cannula oxygen  Post-op Assessment: Report given to RN and Post -op Vital signs reviewed and stable  Post vital signs: Reviewed and stable  Last Vitals:  Vitals Value Taken Time  BP 144/71 04/19/21 0949  Temp 36.1 C 04/19/21 0949  Pulse 79 04/19/21 0953  Resp 19 04/19/21 0953  SpO2 99 % 04/19/21 0953  Vitals shown include unvalidated device data.  Last Pain:  Vitals:   04/19/21 0949  TempSrc: Axillary  PainSc: 0-No pain         Complications: No complications documented.

## 2021-04-19 NOTE — Progress Notes (Incomplete)
[name] to be D/C'd  per MD order. Discussed with the patient and all questions fully answered.  VSS, Skin clean, dry and intact without evidence of skin break down, no evidence of skin tears noted.  IV catheter discontinued intact. Site without signs and symptoms of complications. Dressing and pressure applied.  An After Visit Summary was printed and given to the patient. Patient waiting at pharmacy.  D/c education completed with patient/family including follow up instructions, medication list, d/c activities limitations if indicated, with other d/c instructions as indicated by MD - patient able to verbalize understanding, all questions fully answered.   Patient instructed to return to ED, call 911, or call MD for any changes in condition.   Patient to be escorted via [place], and D/C home via private auto.

## 2021-04-20 ENCOUNTER — Other Ambulatory Visit (HOSPITAL_COMMUNITY): Payer: Self-pay

## 2021-04-20 ENCOUNTER — Other Ambulatory Visit: Payer: Self-pay | Admitting: *Deleted

## 2021-04-20 ENCOUNTER — Encounter: Payer: Self-pay | Admitting: Gastroenterology

## 2021-04-20 LAB — SURGICAL PATHOLOGY

## 2021-04-20 NOTE — Patient Outreach (Signed)
[facility]) Care Management  04/20/2021  [name] 1961/09/12 [id number]   Noted that member discharged from hospital yesterday after GI bleed.  Call placed to member, state he is "trying get stronger."  Report he does feel better, no longer having black stools.  He will have follow up home visit from EMT tomorrow, request to wait until then to call to have follow up appointments scheduled.  She will also review medication changes at that time.  Discussed insurance change and no longer eligible for [facility] services, state he does not remember making change but does admit to receiving [employer] letters in the mail that he was unable to read.  He will have EMT help read them tomorrow.  Aware of case closure, state he will call this care manager back if insurance change is an error.  Will close case, will reopen if member calls back.  Goals Addressed            This Visit's Progress   . COMPLETED: Monitor and Manage My Blood Sugar-Diabetes Type 2       Timeframe:  Long-Range Goal Priority:  High Start Date:            2/14                 Expected End Date:   6/14               - check blood sugar before and after exercise - check blood sugar if I feel it is too high or too low - take the blood sugar log to all doctor visits    Why is this important?    Checking your blood sugar at home helps to keep it from getting very high or very low.   Writing the results in a diary or log helps the doctor know how to care for you.   Your blood sugar log should have the time, date and the results.   Also, write down the amount of insulin or other medicine that you take.   Other information, like what you ate, exercise done and how you were feeling, will also be helpful.     Notes:   3/24 - Re-educated on proper diabetes management, including increase exercise and decreased carbs/sugars and sodium in diet  4/21 - Re-educated on proper diabetes diet, encouraged to  increase exercise.  Reviewed recent blood sugars, encouraged to continue medication adherence in effort to decrease daily readings.  Today's reading was 210.   5/25 - Case closed, insurance change to [place]    . COMPLETED: [facility] - Make and Keep All Appointments         Timeframe:  Short-Term Goal Priority:  Medium Start Date:    4/21                  Expected End Date:       5/21    - call to cancel if needed - keep a calendar with appointment dates    Why is this important?   Part of staying healthy is seeing the doctor for follow-up care.  If you forget your appointments, there are some things you can do to stay on track.    Notes:   11/24 - Upcoming appointments reviewed, encouraged to keep all dates  12/22 - Reminded of importance of attending all MD appointments  2/14 - Appointments reviewed, advised to secure transportation in effort to decrease risk of no show  3/24 -  Reviewed recent visit with EMT, reminded to keep and attend all upcoming visits (4/18 with PCP)  4/21 - missed appointment with PCP on 4/18, conference call placed to reschedule.  New appointment is 5/6.  Encouraged to keep and attend office visit.   5/25 - Case closed, insurance change to [facility] [place], MSN [place] Manager [phone]

## 2021-04-20 NOTE — Progress Notes (Signed)
Paramedicine Encounter    Patient ID: [name], male    DOB: 06-18-1961, 60 y.o.   MRN: [id number]  Met with [name] in the home today where we reviewed and verified his most recent medication changes. I filled one pill box accordingly. Minus the following: -Atorvastatin -Carvedilol -Ferrous Sulfate -Potassium  (these need to be picked up from pharmacy)   Refills: Glipizide Metformin Pantoprazole   [name] informed me that in April he switched his insurance from [employer] to [employer]. [name] reports he wants to switch back to [employer]. I provided him the customer care number and advised him to call them and explain the situation and that he wants to switch back to [facility]. He agreed with plan and understood.   I will see [name] early next week to get updated medications from pharmacy and fill his box again for full home visit.  [name] agreed. Home visit complete.             ACTION: Home visit completed Next visit planned for one week

## 2021-04-21 ENCOUNTER — Other Ambulatory Visit (HOSPITAL_COMMUNITY): Payer: Self-pay | Admitting: *Deleted

## 2021-04-21 ENCOUNTER — Other Ambulatory Visit: Payer: Self-pay

## 2021-04-21 ENCOUNTER — Telehealth (HOSPITAL_COMMUNITY): Payer: Self-pay

## 2021-04-21 ENCOUNTER — Encounter: Payer: Self-pay | Admitting: Gastroenterology

## 2021-04-21 MED ORDER — ATORVASTATIN CALCIUM 40 MG PO TABS
40.0000 mg | ORAL_TABLET | Freq: Every day | ORAL | 3 refills | Status: DC
  Filled 2021-04-21: qty 90, 90d supply, fill #0
  Filled 2021-07-27: qty 90, 90d supply, fill #1

## 2021-04-21 MED FILL — Metformin HCl Tab 500 MG: ORAL | 30 days supply | Qty: 60 | Fill #1 | Status: CN

## 2021-04-21 MED FILL — Glipizide Tab 10 MG: ORAL | 30 days supply | Qty: 60 | Fill #1 | Status: CN

## 2021-04-21 NOTE — Telephone Encounter (Signed)
Medications called in for C. Peake- Atorvastatin (needs new RX) Carvedilol (ready) Potassium (ready) Glipizide Metformin Pantoprazole (ready)

## 2021-04-26 ENCOUNTER — Other Ambulatory Visit: Payer: Self-pay

## 2021-04-26 ENCOUNTER — Other Ambulatory Visit (HOSPITAL_COMMUNITY): Payer: Self-pay

## 2021-04-26 MED FILL — Metformin HCl Tab 500 MG: ORAL | 30 days supply | Qty: 60 | Fill #1 | Status: AC

## 2021-04-26 MED FILL — Gabapentin Cap 300 MG: ORAL | 30 days supply | Qty: 180 | Fill #2 | Status: CN

## 2021-04-26 MED FILL — Glipizide Tab 10 MG: ORAL | 30 days supply | Qty: 60 | Fill #1 | Status: AC

## 2021-04-26 NOTE — Progress Notes (Signed)
Paramedicine Encounter    Patient ID: [name], male    DOB: [birth year], 60 y.o.   MRN: [id number]   Patient Care Team: Azzie Glatter, FNP (Inactive) as PCP - General (Family Medicine) [name], MD as PCP - Cardiology (Cardiology) [name], MD as PCP - Advanced Heart Failure (Cardiology) [name], MD as PCP - Electrophysiology (Cardiology) [name], LCSW as Social Worker (Licensed Clinical Social Worker) [name], MD as Consulting Physician (Gastroenterology)  Patient Active Problem List   Diagnosis Date Noted  . Adenomatous polyp of ascending colon   . GI bleeding 04/13/2021  . 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 Acute blood loss anemia   . Angiodysplasia of stomach   . Chronic anticoagulation   . CHF exacerbation ([place]) 06/15/2020  . AKI (acute kidney injury) ([place]) 06/15/2020  . CKD (chronic kidney disease), stage III ([place]) 06/15/2020  . Anemia due to chronic blood loss   . Gastric AVM   . Angiodysplasia of duodenum with hemorrhage   . Acute on chronic systolic (congestive) heart failure ([place]) 04/05/2020  . Anemia 04/05/2020  . PAF (paroxysmal atrial fibrillation) ([place]) 04/05/2020  . OSA on CPAP 04/05/2020  . Syncope and collapse 04/05/2020  . Type 2 diabetes mellitus with hyperglycemia, with long-term current use of insulin ([place]) 12/03/2019  . Diabetic polyneuropathy associated with type 2 diabetes mellitus ([place]) 12/03/2019  . Hyperglycemia 12/03/2019  . History of hyperglycemia 12/03/2019  . Erectile dysfunction 12/03/2019  . CHF (congestive heart failure) ([place]) 10/11/2019  . Paranoid schizophrenia, chronic condition ([place]) 01/26/2015  . Severe recurrent major depressive disorder with psychotic features ([place]) 01/26/2015  . GAD (generalized anxiety disorder) 01/26/2015  . OCD (obsessive compulsive disorder) 01/26/2015  . Panic disorder without agoraphobia 01/26/2015  . Insomnia 01/26/2015     Current Outpatient Medications:  .  acetaminophen (TYLENOL) 500 MG tablet, Take 2 tablets (1,000 mg total) by mouth in the morning and at bedtime., Disp: 180 tablet, Rfl: 3 .  albuterol (VENTOLIN HFA) 108 (90 Base) MCG/ACT inhaler, INHALE 2 PUFFS INTO THE LUNGS EVERY 6 (SIX) HOURS AS NEEDED FOR WHEEZING OR SHORTNESS OF BREATH., Disp: 8 g, Rfl: 3 .  apixaban (ELIQUIS) 5 MG TABS tablet, Take 1 tablet (5 mg total) by mouth 2 (two) times daily., Disp: 180 tablet, Rfl: 3 .  atorvastatin (LIPITOR) 40 MG tablet, Take 1 tablet (40 mg total) by mouth daily at 6 PM., Disp: 90 tablet, Rfl: 3 .  blood glucose meter kit and supplies KIT, Dispense based on patient and insurance preference. Use up to four times daily as directed. (FOR ICD-9 250.00, 250.01)., Disp: 1 each, Rfl: 0 .  carvedilol (COREG) 6.25 MG tablet, Take 1 tablet (6.25 mg total) by mouth 2 (two) times daily with a meal., Disp: 60 tablet, Rfl: 0 .  Continuous Blood Gluc Receiver (FREESTYLE LIBRE 14 DAY READER) DEVI, 1 each by Does not apply route as needed., Disp: 1 each, Rfl: 11 .  cyclobenzaprine (FLEXERIL) 10 MG tablet, Take 10 mg by mouth 3 (three) times daily as needed for muscle spasms., Disp: , Rfl:  .  dapagliflozin propanediol (FARXIGA) 10 MG TABS tablet, Take 1 tablet (10 mg total) by mouth daily before breakfast., Disp: 30 tablet, Rfl: 11 .  ferrous sulfate 325 (65 FE) MG tablet, Take 1 tablet (325 mg total) by mouth every other day., Disp: 30 tablet, Rfl: 0 .  furosemide (LASIX) 40 MG tablet, Take 1 tablet (40 mg  total) by mouth 2 (two) times daily., Disp: 60 tablet, Rfl: 0 .  gabapentin (NEURONTIN) 300 MG capsule, TAKE 2 CAPSULES (TOTAL = 600 MG), BY MOUTH, 3 TIMES A DAY. (Patient taking differently: Take 600 mg by mouth 3 (three) times daily.), Disp: 180 capsule, Rfl: 11 .  glipiZIDE (GLUCOTROL) 10 MG tablet, TAKE 1 TABLET (10 MG TOTAL) BY MOUTH 2 (TWO) TIMES DAILY BEFORE A MEAL. (Patient taking differently: Take 10 mg by mouth 2  (two) times daily.), Disp: 60 tablet, Rfl: 6 .  insulin glargine (LANTUS) 100 UNIT/ML injection, INJECT 0.2 MLS (20 UNITS TOTAL) INTO THE SKIN DAILY. (Patient taking differently: Inject 20 Units into the skin daily.), Disp: 30 mL, Rfl: 3 .  Insulin Lispro Prot & Lispro (HUMALOG MIX 75/25 KWIKPEN) (75-25) 100 UNIT/ML Kwikpen, Inject 30 Units into the skin 2 (two) times daily., Disp: 15 mL, Rfl: 11 .  Insulin Pen Needle 31G X 6 MM MISC, USE AS DIRECTED, Disp: 100 each, Rfl: 0 .  Insulin Syringe-Needle U-100 31G X 5/16" 0.3 ML MISC, USE 3 TIMES DAILY TO INJECT INSULIN, Disp: 300 each, Rfl: 3 .  metFORMIN (GLUCOPHAGE) 500 MG tablet, TAKE 1 TABLET (500 MG TOTAL) BY MOUTH 2 (TWO) TIMES DAILY WITH A MEAL. (Patient taking differently: Take 500 mg by mouth 2 (two) times daily with a meal.), Disp: 60 tablet, Rfl: 6 .  Multiple Vitamins-Minerals (CENTRUM SILVER 50+MEN) TABS, Take 1 tablet by mouth daily., Disp: , Rfl:  .  pantoprazole (PROTONIX) 40 MG tablet, Take 1 tablet (40 mg total) by mouth daily., Disp: 30 tablet, Rfl: 0 .  potassium chloride SA (KLOR-CON) 20 MEQ tablet, Take 2 tablets (40 mEq total) by mouth daily., Disp: 2 tablet, Rfl: 0 .  QUEtiapine (SEROQUEL) 300 MG tablet, TAKE 1 TABLET BY MOUTH AT BEDTIME. (Patient taking differently: Take 300 mg by mouth at bedtime.), Disp: 90 tablet, Rfl: 0 .  tetrahydrozoline 0.05 % ophthalmic solution, Place 1 drop into both eyes daily., Disp: , Rfl:  .  TRUEPLUS PEN NEEDLES 31G X 6 MM MISC, USE AS DIRECTED, Disp: 100 each, Rfl: 0 No Known Allergies   Social History   Socioeconomic History  . Marital status: Legally Separated    Spouse name: Not on file  . Number of children: Not on file  . Years of education: Not on file  . Highest education level: Not on file  Occupational History  . Not on file  Tobacco Use  . 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 Smokeless tobacco: Never Used  Vaping Use  . Vaping Use: Never used  Substance and Sexual Activity  .  Alcohol use: Not Currently    Alcohol/week: 2.0 standard drinks    Types: 2 Cans of beer per week  . Drug use: No  . Sexual activity: Yes  Other Topics Concern  . Not on file  Social History Narrative  . Not on file   Social Determinants of Health   Financial Resource Strain: Not on file  Food Insecurity: No Food Insecurity  . Worried About Charity fundraiser in the Last Year: Never true  . Ran Out of Food in the Last Year: Never true  Transportation Needs: Unmet Transportation Needs  . Lack of Transportation (Medical): Yes  . Lack of Transportation (Non-Medical): Yes  Physical Activity: Not on file  Stress: Not on file  Social Connections: Not on file  Intimate Partner Violence: Not on file    Physical Exam Vitals reviewed.  Constitutional:  Appearance: He is normal weight.  HENT:     Head: Normocephalic.     Nose: Nose normal.     Mouth/Throat:     Mouth: Mucous membranes are moist.     Pharynx: Oropharynx is clear.  Eyes:     Conjunctiva/sclera: Conjunctivae normal.     Pupils: Pupils are equal, round, and reactive to light.  Cardiovascular:     Rate and Rhythm: Normal rate and regular rhythm.     Pulses: Normal pulses.     Heart sounds: Normal heart sounds.  Pulmonary:     Effort: Pulmonary effort is normal.     Breath sounds: Normal breath sounds.  Abdominal:     General: Abdomen is flat.     Palpations: Abdomen is soft.  Musculoskeletal:        General: Normal range of motion.     Cervical back: Normal range of motion.     Right lower leg: No edema.     Left lower leg: No edema.  Skin:    General: Skin is warm and dry.     Capillary Refill: Capillary refill takes less than 2 seconds.  Neurological:     General: No focal deficit present.     Mental Status: He is alert. Mental status is at baseline.  Psychiatric:        Mood and Affect: Mood normal.    Arrived for home visit for [name] who was alert and oriented reporting to be feeling okay just  stressed about money and wanting his teeth being removed. During our visit assessment as noted. Vitals were obtained and were normotensive except his sugar was elevated at 330. [name] reports he has not had his insulin today. I coached [name] on diabetic management. He verbalized understanding. I reviewed medications and confirmed list. Pill box filled accordingly.    During visit I called [employer] and they advised me [name] could be seen by any dentist who accepted Medicare PPO in or out of network. I contacted the [facility] on [address] and they accept this insurance and we scheduled his consult for teeth extraction for July 12th at 2:30. I will help [name] get new patient information packet complete.   I reached out to Dr. Theressa Millard office to have them send over his X-rays to [facility].   Home visit complete. I will see [name] in one week.   Refills: Gabapentin Eliquis Farxiga Lasix   CBG- 330     Future Appointments  Date Time Provider [place]  05/11/2021  3:00 PM [facility] PA/NP [facility] None  06/03/2021  7:40 AM [facility] DEVICE REMOTES [facility]  09/02/2021  7:40 AM [facility] DEVICE REMOTES [facility]  09/30/2021  1:20 PM [name], NP [facility] None  12/02/2021  7:40 AM [facility] DEVICE REMOTES [facility]  03/03/2022  7:40 AM [facility] DEVICE REMOTES [facility]     ACTION: Home visit completed Next visit planned for one week

## 2021-05-02 ENCOUNTER — Encounter (HOSPITAL_COMMUNITY): Payer: Medicare Other

## 2021-05-03 ENCOUNTER — Telehealth (HOSPITAL_COMMUNITY): Payer: Self-pay | Admitting: Pharmacy Technician

## 2021-05-03 ENCOUNTER — Telehealth (HOSPITAL_COMMUNITY): Payer: Self-pay

## 2021-05-03 ENCOUNTER — Other Ambulatory Visit: Payer: Self-pay

## 2021-05-03 ENCOUNTER — Other Ambulatory Visit (HOSPITAL_COMMUNITY): Payer: Self-pay

## 2021-05-03 MED FILL — Gabapentin Cap 300 MG: ORAL | 30 days supply | Qty: 180 | Fill #2 | Status: AC

## 2021-05-03 NOTE — Telephone Encounter (Signed)
Spoke to [place] today who was informed that the cost of his medications are increased due to him switching insurances.   Farxiga >$100 Eliquis $46  Gabapentin & Lasix $15   I will speak to [facility] about these costs and follow up with Mr. Pietila on same.

## 2021-05-03 NOTE — Telephone Encounter (Signed)
Advanced Heart Failure Patient Advocate Encounter  I received a message that the patient has new insurance and the co-pay for Wilder Glade is now unaffordable. Current 30 day co-pay is $100. Started an application for [place] assistance.  Will fax in once signatures are obtained.  Also, the patient has had a financial change. If we can send BMS proof of the change, we should be able to get a determination on the Eliquis as well.

## 2021-05-04 ENCOUNTER — Other Ambulatory Visit (HOSPITAL_COMMUNITY): Payer: Self-pay

## 2021-05-04 NOTE — Progress Notes (Signed)
Paramedicine Encounter    Patient ID: [name], male    DOB: 03/28/1961, 60 y.o.   MRN: [id number]   Patient Care Team: [name], MD as PCP - Cardiology (Cardiology) [name], MD as PCP - Advanced Heart Failure (Cardiology) [name], MD as PCP - Electrophysiology (Cardiology) [name], LCSW as Social Worker (Licensed Clinical Social Worker) [name], MD as Consulting Physician (Gastroenterology)  Patient Active Problem List   Diagnosis Date Noted  . Adenomatous polyp of ascending colon   . GI bleeding 04/13/2021  . 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 Acute blood loss anemia   . Angiodysplasia of stomach   . Chronic anticoagulation   . CHF exacerbation ([place]) 06/15/2020  . AKI (acute kidney injury) ([place]) 06/15/2020  . CKD (chronic kidney disease), stage III ([place]) 06/15/2020  . Anemia due to chronic blood loss   . Gastric AVM   . Angiodysplasia of duodenum with hemorrhage   . Acute on chronic systolic (congestive) heart failure ([place]) 04/05/2020  . Anemia 04/05/2020  . PAF (paroxysmal atrial fibrillation) ([place]) 04/05/2020  . OSA on CPAP 04/05/2020  . Syncope and collapse 04/05/2020  . Type 2 diabetes mellitus with hyperglycemia, with long-term current use of insulin ([place]) 12/03/2019  . Diabetic polyneuropathy associated with type 2 diabetes mellitus ([place]) 12/03/2019  . Hyperglycemia 12/03/2019  . History of hyperglycemia 12/03/2019  . Erectile dysfunction 12/03/2019  . CHF (congestive heart failure) ([place]) 10/11/2019  . Paranoid schizophrenia, chronic condition ([place]) 01/26/2015  . Severe recurrent major depressive disorder with psychotic features ([place]) 01/26/2015  . GAD (generalized anxiety disorder) 01/26/2015  . OCD (obsessive compulsive disorder) 01/26/2015  . Panic disorder without agoraphobia 01/26/2015  . Insomnia 01/26/2015    Current Outpatient Medications:  .  acetaminophen (TYLENOL)  500 MG tablet, Take 2 tablets (1,000 mg total) by mouth in the morning and at bedtime., Disp: 180 tablet, Rfl: 3 .  albuterol (VENTOLIN HFA) 108 (90 Base) MCG/ACT inhaler, INHALE 2 PUFFS INTO THE LUNGS EVERY 6 (SIX) HOURS AS NEEDED FOR WHEEZING OR SHORTNESS OF BREATH., Disp: 8 g, Rfl: 3 .  apixaban (ELIQUIS) 5 MG TABS tablet, Take 1 tablet (5 mg total) by mouth 2 (two) times daily., Disp: 180 tablet, Rfl: 3 .  atorvastatin (LIPITOR) 40 MG tablet, Take 1 tablet (40 mg total) by mouth daily at 6 PM., Disp: 90 tablet, Rfl: 3 .  blood glucose meter kit and supplies KIT, Dispense based on patient and insurance preference. Use up to four times daily as directed. (FOR ICD-9 250.00, 250.01)., Disp: 1 each, Rfl: 0 .  carvedilol (COREG) 6.25 MG tablet, Take 1 tablet (6.25 mg total) by mouth 2 (two) times daily with a meal., Disp: 60 tablet, Rfl: 0 .  Continuous Blood Gluc Receiver (FREESTYLE LIBRE 14 DAY READER) DEVI, 1 each by Does not apply route as needed. (Patient not taking: Reported on 04/26/2021), Disp: 1 each, Rfl: 11 .  cyclobenzaprine (FLEXERIL) 10 MG tablet, Take 10 mg by mouth 3 (three) times daily as needed for muscle spasms., Disp: , Rfl:  .  dapagliflozin propanediol (FARXIGA) 10 MG TABS tablet, Take 1 tablet (10 mg total) by mouth daily before breakfast., Disp: 30 tablet, Rfl: 11 .  ferrous sulfate 325 (65 FE) MG tablet, Take 1 tablet (325 mg total) by mouth every other day., Disp: 30 tablet, Rfl: 0 .  furosemide (LASIX) 40 MG tablet, Take 1 tablet (40 mg total) by mouth 2 (two)  times daily., Disp: 60 tablet, Rfl: 0 .  gabapentin (NEURONTIN) 300 MG capsule, TAKE 2 CAPSULES (TOTAL = 600 MG), BY MOUTH, 3 TIMES A DAY. (Patient taking differently: Take 600 mg by mouth 3 (three) times daily.), Disp: 180 capsule, Rfl: 11 .  glipiZIDE (GLUCOTROL) 10 MG tablet, TAKE 1 TABLET (10 MG TOTAL) BY MOUTH 2 (TWO) TIMES DAILY BEFORE A MEAL. (Patient taking differently: Take 10 mg by mouth 2 (two) times daily.),  Disp: 60 tablet, Rfl: 6 .  insulin glargine (LANTUS) 100 UNIT/ML injection, INJECT 0.2 MLS (20 UNITS TOTAL) INTO THE SKIN DAILY. (Patient taking differently: Inject 20 Units into the skin daily.), Disp: 30 mL, Rfl: 3 .  Insulin Lispro Prot & Lispro (HUMALOG MIX 75/25 KWIKPEN) (75-25) 100 UNIT/ML Kwikpen, Inject 30 Units into the skin 2 (two) times daily., Disp: 15 mL, Rfl: 11 .  Insulin Pen Needle 31G X 6 MM MISC, USE AS DIRECTED, Disp: 100 each, Rfl: 0 .  Insulin Syringe-Needle U-100 31G X 5/16" 0.3 ML MISC, USE 3 TIMES DAILY TO INJECT INSULIN, Disp: 300 each, Rfl: 3 .  metFORMIN (GLUCOPHAGE) 500 MG tablet, TAKE 1 TABLET (500 MG TOTAL) BY MOUTH 2 (TWO) TIMES DAILY WITH A MEAL. (Patient taking differently: Take 500 mg by mouth 2 (two) times daily with a meal.), Disp: 60 tablet, Rfl: 6 .  Multiple Vitamins-Minerals (CENTRUM SILVER 50+MEN) TABS, Take 1 tablet by mouth daily., Disp: , Rfl:  .  pantoprazole (PROTONIX) 40 MG tablet, Take 1 tablet (40 mg total) by mouth daily., Disp: 30 tablet, Rfl: 0 .  potassium chloride SA (KLOR-CON) 20 MEQ tablet, Take 2 tablets (40 mEq total) by mouth daily., Disp: 2 tablet, Rfl: 0 .  QUEtiapine (SEROQUEL) 300 MG tablet, TAKE 1 TABLET BY MOUTH AT BEDTIME. (Patient taking differently: Take 300 mg by mouth at bedtime.), Disp: 90 tablet, Rfl: 0 .  tetrahydrozoline 0.05 % ophthalmic solution, Place 1 drop into both eyes daily., Disp: , Rfl:  .  TRUEPLUS PEN NEEDLES 31G X 6 MM MISC, USE AS DIRECTED, Disp: 100 each, Rfl: 0 No Known Allergies   Social History   Socioeconomic History  . Marital status: Legally Separated    Spouse name: Not on file  . Number of children: Not on file  . Years of education: Not on file  . Highest education level: Not on file  Occupational History  . Not on file  Tobacco Use  . 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 Smokeless tobacco: Never Used  Vaping Use  . Vaping Use: Never used  Substance and Sexual Activity  . Alcohol use: Not  Currently    Alcohol/week: 2.0 standard drinks    Types: 2 Cans of beer per week  . Drug use: No  . Sexual activity: Yes  Other Topics Concern  . Not on file  Social History Narrative  . Not on file   Social Determinants of Health   Financial Resource Strain: Not on file  Food Insecurity: No Food Insecurity  . Worried About Charity fundraiser in the Last Year: Never true  . Ran Out of Food in the Last Year: Never true  Transportation Needs: Unmet Transportation Needs  . Lack of Transportation (Medical): Yes  . Lack of Transportation (Non-Medical): Yes  Physical Activity: Not on file  Stress: Not on file  Social Connections: Not on file  Intimate Partner Violence: Not on file    Physical Exam Vitals reviewed.  Constitutional:      Appearance:  He is obese.  HENT:     Head: Normocephalic.     Mouth/Throat:     Mouth: Mucous membranes are moist.     Pharynx: Oropharynx is clear.  Eyes:     Pupils: Pupils are equal, round, and reactive to light.  Cardiovascular:     Rate and Rhythm: Normal rate and regular rhythm.     Pulses: Normal pulses.     Heart sounds: Normal heart sounds.  Pulmonary:     Effort: Pulmonary effort is normal.     Breath sounds: Normal breath sounds.  Abdominal:     Palpations: Abdomen is soft.  Musculoskeletal:        General: No swelling. Normal range of motion.     Cervical back: Normal range of motion.     Right lower leg: No edema.     Left lower leg: No edema.  Skin:    General: Skin is warm and dry.     Capillary Refill: Capillary refill takes less than 2 seconds.  Neurological:     General: No focal deficit present.     Mental Status: He is alert. Mental status is at baseline.  Psychiatric:        Mood and Affect: Mood normal.       Arrived for home visit for [facility] who was alert and oriented reporting to be feeling tired. [name] gained 11lbs since last week and reports eating way more than he usually does. [name] had no edema noted,  lung sounds were clear, no JVD. [name] reports he is not short of breath, dizzy or having trouble moving around or sleeping. He states he just feels tired. I obtained vitals and they are as noted. Medications were reviewed. I gave [name] his daily medications during our visit as he has not yet taken them today. [name] was coached intently on diet and exercise and how important it is to comply to ensure he stays home and healthy. [name] verbalized understanding. Home visit complete. I reviewed appointments with [name] and gave him appointment info for next week.   Refills: NONE   CBG- 166  -Chris will pick up Freestyle from [place] for $0 copay.   Future Appointments  Date Time Provider [place]  05/11/2021  3:00 PM [facility] PA/NP [facility] None  06/03/2021  7:40 AM [facility] DEVICE REMOTES [facility]  06/27/2021 10:40 AM [name], NP [facility] None  09/02/2021  7:40 AM [facility] DEVICE REMOTES [facility]  09/30/2021  1:20 PM [name], NP [place] None  12/02/2021  7:40 AM [facility] DEVICE REMOTES [facility]  03/03/2022  7:40 AM [facility] DEVICE REMOTES [facility]     ACTION: Home visit completed

## 2021-05-11 ENCOUNTER — Other Ambulatory Visit: Payer: Self-pay

## 2021-05-11 ENCOUNTER — Other Ambulatory Visit (HOSPITAL_COMMUNITY): Payer: Self-pay

## 2021-05-11 ENCOUNTER — Ambulatory Visit (HOSPITAL_COMMUNITY)
Admission: RE | Admit: 2021-05-11 | Discharge: 2021-05-11 | Disposition: A | Discharge status: Home / Self Care | Payer: Medicare (Managed Care) | Source: Ambulatory Visit | Attending: Family Medicine | Admitting: Family Medicine

## 2021-05-11 ENCOUNTER — Encounter (HOSPITAL_COMMUNITY): Payer: Self-pay

## 2021-05-11 VITALS — BP 152/86 | HR 80 | Ht 74.0 in | Wt 330.2 lb

## 2021-05-11 DIAGNOSIS — Z8719 Personal history of other diseases of the digestive system: Secondary | ICD-10-CM | POA: Diagnosis not present

## 2021-05-11 DIAGNOSIS — Z7901 Long term (current) use of anticoagulants: Secondary | ICD-10-CM | POA: Diagnosis not present

## 2021-05-11 DIAGNOSIS — I480 Paroxysmal atrial fibrillation: Secondary | ICD-10-CM | POA: Diagnosis not present

## 2021-05-11 DIAGNOSIS — F209 Schizophrenia, unspecified: Secondary | ICD-10-CM | POA: Diagnosis not present

## 2021-05-11 DIAGNOSIS — Z955 Presence of coronary angioplasty implant and graft: Secondary | ICD-10-CM | POA: Insufficient documentation

## 2021-05-11 DIAGNOSIS — Z7984 Long term (current) use of oral hypoglycemic drugs: Secondary | ICD-10-CM | POA: Diagnosis not present

## 2021-05-11 DIAGNOSIS — I2510 Atherosclerotic heart disease of native coronary artery without angina pectoris: Secondary | ICD-10-CM | POA: Diagnosis not present

## 2021-05-11 DIAGNOSIS — Z713 Dietary counseling and surveillance: Secondary | ICD-10-CM | POA: Diagnosis not present

## 2021-05-11 DIAGNOSIS — E119 Type 2 diabetes mellitus without complications: Secondary | ICD-10-CM | POA: Diagnosis not present

## 2021-05-11 DIAGNOSIS — G4733 Obstructive sleep apnea (adult) (pediatric): Secondary | ICD-10-CM | POA: Diagnosis not present

## 2021-05-11 DIAGNOSIS — Z794 Long term (current) use of insulin: Secondary | ICD-10-CM | POA: Diagnosis not present

## 2021-05-11 DIAGNOSIS — E1165 Type 2 diabetes mellitus with hyperglycemia: Secondary | ICD-10-CM

## 2021-05-11 DIAGNOSIS — E669 Obesity, unspecified: Secondary | ICD-10-CM | POA: Diagnosis not present

## 2021-05-11 DIAGNOSIS — I5022 Chronic systolic (congestive) heart failure: Secondary | ICD-10-CM

## 2021-05-11 DIAGNOSIS — I110 Hypertensive heart disease with heart failure: Secondary | ICD-10-CM | POA: Insufficient documentation

## 2021-05-11 DIAGNOSIS — Z6841 Body Mass Index (BMI) 40.0 and over, adult: Secondary | ICD-10-CM | POA: Insufficient documentation

## 2021-05-11 DIAGNOSIS — Z8249 Family history of ischemic heart disease and other diseases of the circulatory system: Secondary | ICD-10-CM | POA: Diagnosis not present

## 2021-05-11 DIAGNOSIS — Z79899 Other long term (current) drug therapy: Secondary | ICD-10-CM | POA: Diagnosis not present

## 2021-05-11 DIAGNOSIS — I10 Essential (primary) hypertension: Secondary | ICD-10-CM

## 2021-05-11 DIAGNOSIS — T82855D Stenosis of coronary artery stent, subsequent encounter: Secondary | ICD-10-CM | POA: Diagnosis not present

## 2021-05-11 LAB — BASIC METABOLIC PANEL
Anion gap: 9 (ref 5–15)
BUN: 14 mg/dL (ref 8–23)
CO2: 27 mmol/L (ref 22–32)
Calcium: 8.5 mg/dL — ABNORMAL LOW (ref 8.9–10.3)
Chloride: 104 mmol/L (ref 98–111)
Creatinine, Ser: 1.19 mg/dL (ref 0.61–1.24)
GFR, Estimated: >60 mL/min (ref >60)
Glucose, Bld: 198 mg/dL — ABNORMAL HIGH (ref 70–99)
Potassium: 3.6 mmol/L (ref 3.5–5.1)
Sodium: 140 mmol/L (ref 135–145)

## 2021-05-11 LAB — BRAIN NATRIURETIC PEPTIDE: B Natriuretic Peptide: 110.8 pg/mL — ABNORMAL HIGH (ref 0.0–100.0)

## 2021-05-11 MED ORDER — CARVEDILOL 6.25 MG PO TABS
6.2500 mg | ORAL_TABLET | Freq: Two times a day (BID) | ORAL | 3 refills | Status: DC
  Filled 2021-05-11 – 2021-05-25 (×2): qty 60, 30d supply, fill #0
  Filled 2021-06-27 – 2021-07-04 (×2): qty 60, 30d supply, fill #1
  Filled 2021-07-27: qty 60, 30d supply, fill #2
  Filled 2021-09-05 – 2021-09-21 (×3): qty 60, 30d supply, fill #3

## 2021-05-11 MED ORDER — ENTRESTO 24-26 MG PO TABS: 1.0000 | ORAL_TABLET | Freq: Two times a day (BID) | ORAL | 3 refills | Status: DC

## 2021-05-11 MED ORDER — FUROSEMIDE 40 MG PO TABS
60.0000 mg | ORAL_TABLET | Freq: Two times a day (BID) | ORAL | 3 refills | Status: DC
Start: 2021-05-11 — End: 2021-10-18
  Filled 2021-05-11 – 2021-05-25 (×2): qty 90, 30d supply, fill #0
  Filled 2021-06-27 – 2021-07-04 (×2): qty 90, 30d supply, fill #1
  Filled 2021-08-02: qty 90, 30d supply, fill #2
  Filled 2021-09-13 – 2021-09-21 (×2): qty 90, 30d supply, fill #3

## 2021-05-11 MED ORDER — POTASSIUM CHLORIDE CRYS ER 20 MEQ PO TBCR
40.0000 meq | EXTENDED_RELEASE_TABLET | Freq: Every day | ORAL | 3 refills | Status: DC
  Filled 2021-05-11: qty 60, 30d supply, fill #0
  Filled 2021-06-09: qty 60, 30d supply, fill #1
  Filled 2021-07-14 – 2021-07-21 (×2): qty 60, 30d supply, fill #2
  Filled 2021-08-02 – 2021-08-08 (×2): qty 60, 30d supply, fill #3

## 2021-05-11 NOTE — Patient Instructions (Addendum)
INCREASE Lasix to 60 mg (one and one half) tab twice daily RESTART Entresto to 24/26 mg, one tab twice a day RESTART Potassium 40 meq daily  Labs today We will only contact you if something comes back abnormal or we need to make some changes. Otherwise no news is good news!  Labs needed in 7-10 days  Your physician recommends that you schedule a follow-up appointment in: 3 weeks  in the [facility] Pract[facility] ([facility]NP[facility]   Your physician has recommended that you have a sleep study. This test records several body functions during sleep, including: brain activity, eye movement, oxygen and carbon dioxide blood levels, heart rate and rhythm, breathing rate and rhythm, the flow of air through your mouth and nose, snoring, body muscle movements, and chest and belly movement.  Do the following things EVERYDAY: Weigh yourself in the morning before breakfast. Write it down and keep it in a log. Take your medicines as prescribed Eat low salt foods--Limit salt (sodium) to 2000 mg per day.  Stay as active as you can everyday Limit all fluids for the day to less than 2 liters  At the [facility], you and your health needs are our priority. As part of our continuing mission to provide you with exceptional heart care, we have created designated Provider Care Teams. These Care Teams include your primary Cardiologist (physician) and Advanced Practice Providers (APPs- Physician Assistants and Nurse Practitioners) who all work together to provide you with the care you need, when you need it.   You may see any of the following providers on your designated Care Team at your next follow up: Dr Glori Bickers Dr Loralie Champagne Dr Patrice Paradise, NP Lyda Jester, [place] Ginnie Smart [name], PharmD   Please be sure to bring in all your medications bottles to every appointment.

## 2021-05-11 NOTE — Progress Notes (Signed)
Patient Name: [name]        DOB: Feb 23, 1961      Height: 6'3"    Weight:330 lb  Office Name:[facility]         Referring Provider:Jessica [place],NP/Dr Loralie Champagne, MD  Today's Date:05/11/2021   STOP BANG RISK ASSESSMENT S (snore) Have you been told that you snore?     YES   T (tired) Are you often tired, fatigued, or sleepy during the day?   YES  O (obstruction) Do you stop breathing, choke, or gasp during sleep? YES   P (pressure) Do you have or are you being treated for high blood pressure? YES   B (BMI) Is your body index greater than 35 kg/m? YES   A (age) Are you 67 years old or older? YES   N (neck) Do you have a neck circumference greater than 16 inches?   YES   G (gender) Are you a male? YES   TOTAL STOP/BANG "YES" ANSWERS                                                                        For Office Use Only              Procedure Order Form    YES to 3+ Stop Bang questions OR two clinical symptoms - patient qualifies for WatchPAT (CPT 95800)             Clinical Notes: Will consult Sleep Specialist and refer for management of therapy due to patient increased risk of Sleep Apnea. Ordering a sleep study due to the following two clinical symptoms: Excessive daytime sleepiness G47.10 / Gastroesophageal reflux K21.9 / Nocturia R35.1 / Morning Headaches G44.221 / Difficulty concentrating R41.840 / Memory problems or poor judgment G31.84 / Personality changes or irritability R45.4 / Loud snoring R06.83 / Depression F32.9 / Unrefreshed by sleep G47.8 / Impotence N52.9 / History of high blood pressure R03.0 / Insomnia G47.00    I understand that I am proceeding with a home sleep apnea test as ordered by my treating physician. I understand that untreated sleep apnea is a serious cardiovascular risk factor and it is my responsibility to perform the test and seek management for sleep apnea. I will be contacted with the results and be managed  for sleep apnea by a local sleep physician. I will be receiving equipment and further instructions from [facility]. I shall promptly ship back the equipment via the included mailing label. I understand my insurance will be billed for the test and as the patient I am responsible for any insurance related out-of-pocket costs incurred. I have been provided with written instructions and can call for additional video or telephonic instruction, with 24-hour availability of qualified personnel to answer any questions: Patient Help Desk [phone].  Patient Signature ______________________________________________________   Date______________________ Patient Telemedicine Verbal Consent

## 2021-05-11 NOTE — Progress Notes (Signed)
Paramedicine Encounter    Patient ID: [name], male    DOB: 06/18/1961, 60 y.o.   MRN: [id number]    I saw [name] today in clinic with Allena Katz NP. [name] reports feeling okay just tired with shortness of breath while going up stairs. Vitals and REDS clip reading obtained. REDS Clip- 39% Labs were drawn also.   The following med changes were made:  Lasix increase to 60mg  BID Potassium 40MEQ daily Entresto 24-46mg  BID.  Repeat labs and follow up appointment scheduled. I will follow up with [name] in two weeks.   Sleep study ordered by NP.   Clinic visit complete.   Refills: Lasix Carvedilol      ACTION: Home visit completed

## 2021-05-11 NOTE — Progress Notes (Signed)
ReDS Vest / Clip - 05/11/21 1515       ReDS Vest / Clip   Station Marker D    Ruler Value 42    ReDS Value Range Moderate volume overload    ReDS Actual Value 39    Anatomical Comments sitting

## 2021-05-11 NOTE — Progress Notes (Signed)
 ADVANCED HEART FAILURE CLINIC NOTE  PCP: No primary care provider on file. Cardiology: Dr. Turner HF Cardiology: Dr. McLean  60 y.o. with history of chronic systolic CHF, CAD, type 2 diabetes, paroxysmal atrial fibrillation, and schizophrenia was referred by Dr. Turner for evaluation of CHF.  Patient had OM2 PCI in 2012.  In 11/20, he was admitted with CHF. Echo showed EF 25-30% with diffuse hypokinesis.  LHC was done, showing occluded OM2 at prior stent, 90% D1 stenosis, and extensive diffuse RCA disease.  No intervention.  He was thought to be in paroxysmal atrial fibrillation during this appointment and apixaban was started.   He does not smoke, rarely drinks, and does not use drugs.  His mother had "heart problems."    He can write his name only. He is only able to read a few words. Thinks he completed the 7th grade.    Echo in 5/21 showed EF 30% with diffuse hypokinesis, mild LVH, PASP 38, mildly decreased RV systolic function, IVC dilated. Biotronik ICD placed.   He was hospitalized in 7/21 with upper GI bleeding and CHF exacerbation.  EGD showed duodenal AVMs, treated with APC. He was diuresed.   Clinic visit 5/22 he was volume overloaded, weight up 19 lbs, and diuretics increased and Farxiga started.   He was admitted to [facility] a couple weeks later (04/13/21) for a/c CHF. He was diuresed with IV lasix. Hospitalization complicated by GIB & AKI. Enteroscopy performed on 5/20 which showed 2 small angioectasias with no bleeding in the third portion of the duodenum. Colonoscopy on 5/21, showed multiple polyps which were biopsied. No source of bleeding found. He had pill endoscopy 5/22 which showed non bleeding AVM. Pt had repeat enteroscopy which showed non bleeding jejunal ulcer. Eliquis held during admission but restarted at discharge. Home hydralazine, spiro, Entresto, and Imdur stopped in setting of GIB; carvedilol dose decreased at discharge.  Today he returns for post hospitalization HF  follow up . Overall feeling fine. SOB with stairs. Denies increasing SOB, CP, dizziness, edema, or PND/Orthopnea. Appetite ok. No fever or chills. Weight at home ~ 330 pounds. Up 10 lbs per paramedicine. Taking all medications. Eating fast food when he was at motel. Now he is back at home and cooking for himself.   Reds: 38% ECG (personally reviewed): SR w/ PACs, 68 bpm   Labs (1/21): LDL 66, HDL 42, hgb 11.3, K 4.7, creatinine 1.32 Labs (4/21): K 5, creatinine 1.37 Labs (7/21): K 4.1, creatinine 1.26, hgb 9.3 Labs (8/21): LDL 51, K 3.8, creatinine 1.3 Labs (11/21): K 4, creatinine 1.42, hgb 13.1, hgbA1c 11.7 Labs (2/22): HgbA1c 7.7 Labs (3/22): K 4.4, creatinine 1.27, pro-BNP 249. Labs (5/22): K 4.4, creatinine 1.22,  PMH:  1. Atrial fibrillation: Paroxysmal 2. Type 2 diabetes 3. HTN 4. Hyperlipidemia 5. Schizophrenia 6. CAD: PCI OM2 in 2012.  - LHC (11/20): 90% D1 stenosis, totally occluded OM2 at stent, serial 85%/70%/60% RCA stenoses.  7. 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  Biotronik ICD.  - Echo (11/20): EF 25-30%, global hypokinesis.  - Echo (5/21): EF 30% with diffuse hypokinesis, mild LVH, PASP 38, mildly decreased RV systolic function, IVC dilated. 8. Upper GI bleeding: 7/21, duodenal AVMs treated with APC.  9. OSA: Does not use CPAP regularly.   Social History   Socioeconomic History   Marital status: Legally Separated    Spouse name: Not on file   Number of children: Not on file   Years of education: Not on file     Highest education level: Not on file  Occupational History   Not on file  Tobacco Use   Smoking status: Former    Pack years: 0.00   Smokeless tobacco: Never  Vaping Use   Vaping Use: Never used  Substance and Sexual Activity   Alcohol use: Not Currently    Alcohol/week: 2.0 standard drinks    Types: 2 Cans of beer per week   Drug use: No   Sexual activity: Yes  Other Topics Concern   Not on file  Social  History Narrative   Not on file   Social Determinants of Health   Financial Resource Strain: Not on file  Food Insecurity: No Food Insecurity   Worried About Charity fundraiser in the Last Year: Never true   Ran Out of Food in the Last Year: Never true  Transportation Needs: Unmet Transportation Needs   Lack of Transportation (Medical): Yes   Lack of Transportation (Non-Medical): Yes  Physical Activity: Not on file  Stress: Not on file  Social Connections: Not on file  Intimate Partner Violence: Not on file   Family History  Problem Relation Age of Onset   Heart failure Mother    Mental illness Sister    Mental illness Sister    ROS: All systems reviewed and negative except as per HPI.  Current Meds  Medication Sig   acetaminophen (TYLENOL) 500 MG tablet Take 2 tablets (1,000 mg total) by mouth in the morning and at bedtime.   albuterol (VENTOLIN HFA) 108 (90 Base) MCG/ACT inhaler INHALE 2 PUFFS INTO THE LUNGS EVERY 6 (SIX) HOURS AS NEEDED FOR WHEEZING OR SHORTNESS OF BREATH.   apixaban (ELIQUIS) 5 MG TABS tablet Take 1 tablet (5 mg total) by mouth 2 (two) times daily.   atorvastatin (LIPITOR) 40 MG tablet Take 1 tablet (40 mg total) by mouth daily at 6 PM.   blood glucose meter kit and supplies KIT Dispense based on patient and insurance preference. Use up to four times daily as directed. (FOR ICD-9 250.00, 250.01).   carvedilol (COREG) 6.25 MG tablet Take 1 tablet (6.25 mg total) by mouth 2 (two) times daily with a meal.   Continuous Blood Gluc Receiver (FREESTYLE LIBRE 14 DAY READER) DEVI 1 each by Does not apply route as needed.   cyclobenzaprine (FLEXERIL) 10 MG tablet Take 10 mg by mouth 3 (three) times daily as needed for muscle spasms.   dapagliflozin propanediol (FARXIGA) 10 MG TABS tablet Take 1 tablet (10 mg total) by mouth daily before breakfast.   ferrous sulfate 325 (65 FE) MG tablet Take 1 tablet (325 mg total) by mouth every other day.   furosemide (LASIX) 40 MG  tablet Take 1 tablet (40 mg total) by mouth 2 (two) times daily.   gabapentin (NEURONTIN) 300 MG capsule TAKE 2 CAPSULES (TOTAL = 600 MG), BY MOUTH, 3 TIMES A DAY. (Patient taking differently: Take 600 mg by mouth 3 (three) times daily.)   glipiZIDE (GLUCOTROL) 10 MG tablet TAKE 1 TABLET (10 MG TOTAL) BY MOUTH 2 (TWO) TIMES DAILY BEFORE A MEAL. (Patient taking differently: Take 10 mg by mouth 2 (two) times daily.)   insulin glargine (LANTUS) 100 UNIT/ML injection INJECT 0.2 MLS (20 UNITS TOTAL) INTO THE SKIN DAILY. (Patient taking differently: Inject 20 Units into the skin daily.)   Insulin Lispro Prot & Lispro (HUMALOG MIX 75/25 KWIKPEN) (75-25) 100 UNIT/ML Kwikpen Inject 30 Units into the skin 2 (two) times daily.   Insulin Pen Needle 31G  X 6 MM MISC USE AS DIRECTED   Insulin Syringe-Needle U-100 31G X 5/16" 0.3 ML MISC USE 3 TIMES DAILY TO INJECT INSULIN   metFORMIN (GLUCOPHAGE) 500 MG tablet TAKE 1 TABLET (500 MG TOTAL) BY MOUTH 2 (TWO) TIMES DAILY WITH A MEAL. (Patient taking differently: Take 500 mg by mouth 2 (two) times daily with a meal.)   Multiple Vitamins-Minerals (CENTRUM SILVER 50+MEN) TABS Take 1 tablet by mouth daily.   pantoprazole (PROTONIX) 40 MG tablet Take 1 tablet (40 mg total) by mouth daily.   potassium chloride SA (KLOR-CON) 20 MEQ tablet Take 2 tablets (40 mEq total) by mouth daily.   QUEtiapine (SEROQUEL) 300 MG tablet TAKE 1 TABLET BY MOUTH AT BEDTIME. (Patient taking differently: Take 300 mg by mouth at bedtime.)   tetrahydrozoline 0.05 % ophthalmic solution Place 1 drop into both eyes daily.   TRUEPLUS PEN NEEDLES 31G X 6 MM MISC USE AS DIRECTED   BP (!) 152/86   Pulse 80   Ht 6' 2" (1.88 m)   Wt (!) 149.8 kg (330 lb 3.2 oz)   SpO2 94%   BMI 42.40 kg/m   Wt Readings from Last 3 Encounters:  05/11/21 (!) 149.8 kg (330 lb 3.2 oz)  05/04/21 (!) 150.3 kg (331 lb 6.4 oz)  04/26/21 (!) 145.2 kg (320 lb)   Current Outpatient Medications on File Prior to Encounter   Medication Sig Dispense Refill   acetaminophen (TYLENOL) 500 MG tablet Take 2 tablets (1,000 mg total) by mouth in the morning and at bedtime. 180 tablet 3   albuterol (VENTOLIN HFA) 108 (90 Base) MCG/ACT inhaler INHALE 2 PUFFS INTO THE LUNGS EVERY 6 (SIX) HOURS AS NEEDED FOR WHEEZING OR SHORTNESS OF BREATH. 8 g 3   apixaban (ELIQUIS) 5 MG TABS tablet Take 1 tablet (5 mg total) by mouth 2 (two) times daily. 180 tablet 3   atorvastatin (LIPITOR) 40 MG tablet Take 1 tablet (40 mg total) by mouth daily at 6 PM. 90 tablet 3   blood glucose meter kit and supplies KIT Dispense based on patient and insurance preference. Use up to four times daily as directed. (FOR ICD-9 250.00, 250.01). 1 each 0   carvedilol (COREG) 6.25 MG tablet Take 1 tablet (6.25 mg total) by mouth 2 (two) times daily with a meal. 60 tablet 0   Continuous Blood Gluc Receiver (FREESTYLE LIBRE 14 DAY READER) DEVI 1 each by Does not apply route as needed. 1 each 11   cyclobenzaprine (FLEXERIL) 10 MG tablet Take 10 mg by mouth 3 (three) times daily as needed for muscle spasms.     dapagliflozin propanediol (FARXIGA) 10 MG TABS tablet Take 1 tablet (10 mg total) by mouth daily before breakfast. 30 tablet 11   ferrous sulfate 325 (65 FE) MG tablet Take 1 tablet (325 mg total) by mouth every other day. 30 tablet 0   furosemide (LASIX) 40 MG tablet Take 1 tablet (40 mg total) by mouth 2 (two) times daily. 60 tablet 0   gabapentin (NEURONTIN) 300 MG capsule TAKE 2 CAPSULES (TOTAL = 600 MG), BY MOUTH, 3 TIMES A DAY. (Patient taking differently: Take 600 mg by mouth 3 (three) times daily.) 180 capsule 11   glipiZIDE (GLUCOTROL) 10 MG tablet TAKE 1 TABLET (10 MG TOTAL) BY MOUTH 2 (TWO) TIMES DAILY BEFORE A MEAL. (Patient taking differently: Take 10 mg by mouth 2 (two) times daily.) 60 tablet 6   insulin glargine (LANTUS) 100 UNIT/ML injection INJECT 0.2 MLS (20   UNITS TOTAL) INTO THE SKIN DAILY. (Patient taking differently: Inject 20 Units into the  skin daily.) 30 mL 3   Insulin Lispro Prot & Lispro (HUMALOG MIX 75/25 KWIKPEN) (75-25) 100 UNIT/ML Kwikpen Inject 30 Units into the skin 2 (two) times daily. 15 mL 11   Insulin Pen Needle 31G X 6 MM MISC USE AS DIRECTED 100 each 0   Insulin Syringe-Needle U-100 31G X 5/16" 0.3 ML MISC USE 3 TIMES DAILY TO INJECT INSULIN 300 each 3   metFORMIN (GLUCOPHAGE) 500 MG tablet TAKE 1 TABLET (500 MG TOTAL) BY MOUTH 2 (TWO) TIMES DAILY WITH A MEAL. (Patient taking differently: Take 500 mg by mouth 2 (two) times daily with a meal.) 60 tablet 6   Multiple Vitamins-Minerals (CENTRUM SILVER 50+MEN) TABS Take 1 tablet by mouth daily.     pantoprazole (PROTONIX) 40 MG tablet Take 1 tablet (40 mg total) by mouth daily. 30 tablet 0   potassium chloride SA (KLOR-CON) 20 MEQ tablet Take 2 tablets (40 mEq total) by mouth daily. 2 tablet 0   QUEtiapine (SEROQUEL) 300 MG tablet TAKE 1 TABLET BY MOUTH AT BEDTIME. (Patient taking differently: Take 300 mg by mouth at bedtime.) 90 tablet 0   tetrahydrozoline 0.05 % ophthalmic solution Place 1 drop into both eyes daily.     TRUEPLUS PEN NEEDLES 31G X 6 MM MISC USE AS DIRECTED 100 each 0   No current facility-administered medications on file prior to encounter.   Vitals:   05/11/21 1515  BP: (!) 152/86  Pulse: 80  SpO2: 94%   Wt Readings from Last 3 Encounters:  05/11/21 (!) 149.8 kg (330 lb 3.2 oz)  05/04/21 (!) 150.3 kg (331 lb 6.4 oz)  04/26/21 (!) 145.2 kg (320 lb)   Physical Exam: General:  NAD. No resp difficulty. HEENT: Normal Neck: Supple. JVP 9-10. Carotids 2+ bilat; no bruits. No lymphadenopathy or thryomegaly appreciated. Cor: PMI nondisplaced. Regular rate & rhythm. No rubs, gallops or murmurs. Lungs: Clear Abdomen: Obese, soft, nontender, nondistended. No hepatosplenomegaly. No bruits or masses. Good bowel sounds. Extremities: No cyanosis, clubbing, rash, 2+ LE edema Neuro: alert & oriented x 3, cranial nerves grossly intact. Moves all 4  extremities w/o difficulty. Affect pleasant.  Assessment/Plan: 1. Chronic systolic CHF: Echo in 11/20 with EF 25-30%, echo 5/21 with EF 30% with mildly decreased RV systolic function. Biotronik ICD.  Suspect mixed ischemic/nonischemic cardiomyopathy.  Weight is up 10 lbs, NYHA class II-early III symptoms although functional class difficult to assess due to general physical inactivity and body habitus. He is volume overloaded on exam & by Reds clip.  - Restart Entresto 24/26/mg bid. BMET today, repeat 10 days. - Increase lasix to 60 mg bid + 40 mEq of KCL daily. BNP today. - Continue Coreg 6.25 mg bid.   - Continue Farxiga 10 mg daily. No GU symptoms. - Plan to add spiro back next visit. - Hold off on adding back hydral + imdur until diuresed. - Reinforced low sodium diet.  2. CAD: Last cath in 11/20 with occluded OM2 stent, 90% D1, severe diffuse disease in the RCA (no intervention). No recent chest pain.  - No ASA given apixaban use.  - Continue atorvastatin. Good lipids in 8/22.  - We discussed healthier dietary choices.  3. HTN: BP high today, likely due to volume. Add back meds today as above. - Needs to wear CPAP 4. Atrial fibrillation: Paroxysmal.  He is in NSR by EKG today. Denies overt bleeding.  - Continue   apixaban 5 mg bid. No bleeding issues. 5. DMII: Continue home regimen. A1c 7.1 (5/22). 6. OSA: Needs updated sleep study, CPAP machine dated and does not work. 7. Upper GI bleeding: Occurred in 7/21 s/p APC to duodenal AVMs.  GIB 5/22 with non bleeding AVM and non bleeding jejunal ulcer. Now back on Eliquis. No overt bleeding.   8. Obesity: Weight up today in setting of fluid overload. - Discussed healthier eating choices and limiting salt and fluids.  Followup in 3-4 weeks with APP to reassess volume status and titrate GDMT. Anticipate adding back spiro. Appreciate Paramedicine's assistance.   M , FNP 05/11/2021  

## 2021-05-17 ENCOUNTER — Other Ambulatory Visit: Payer: Self-pay

## 2021-05-18 ENCOUNTER — Other Ambulatory Visit: Payer: Self-pay

## 2021-05-18 ENCOUNTER — Emergency Department (HOSPITAL_COMMUNITY)
Admission: EM | Admit: 2021-05-18 | Discharge: 2021-05-19 | Discharge status: Against Medical Advice | Payer: Medicare (Managed Care) | Attending: Emergency Medicine | Admitting: Emergency Medicine

## 2021-05-18 DIAGNOSIS — K0889 Other specified disorders of teeth and supporting structures: Secondary | ICD-10-CM | POA: Diagnosis present

## 2021-05-18 DIAGNOSIS — Z5321 Procedure and treatment not carried out due to patient leaving prior to being seen by health care provider: Secondary | ICD-10-CM | POA: Insufficient documentation

## 2021-05-18 LAB — CBC WITH DIFFERENTIAL/PLATELET
Abs Immature Granulocytes: 0.02 10*3/uL (ref 0.00–0.07)
Basophils Absolute: 0 10*3/uL (ref 0.0–0.1)
Basophils Relative: 0 %
Eosinophils Absolute: 0.3 10*3/uL (ref 0.0–0.5)
Eosinophils Relative: 3 %
HCT: 36.7 % — ABNORMAL LOW (ref 39.0–52.0)
Hemoglobin: 10.3 g/dL — ABNORMAL LOW (ref 13.0–17.0)
Immature Granulocytes: 0 %
Lymphocytes Relative: 15 %
Lymphs Abs: 1.3 10*3/uL (ref 0.7–4.0)
MCH: 22.4 pg — ABNORMAL LOW (ref 26.0–34.0)
MCHC: 28.1 g/dL — ABNORMAL LOW (ref 30.0–36.0)
MCV: 80 fL (ref 80.0–100.0)
Monocytes Absolute: 0.8 10*3/uL (ref 0.1–1.0)
Monocytes Relative: 9 %
Neutro Abs: 6.2 10*3/uL (ref 1.7–7.7)
Neutrophils Relative %: 73 %
Platelets: 279 10*3/uL (ref 150–400)
RBC: 4.59 MIL/uL (ref 4.22–5.81)
RDW: 16.9 % — ABNORMAL HIGH (ref 11.5–15.5)
WBC: 8.6 10*3/uL (ref 4.0–10.5)
nRBC: 0 % (ref 0.0–0.2)

## 2021-05-18 LAB — BASIC METABOLIC PANEL
Anion gap: 8 (ref 5–15)
BUN: 14 mg/dL (ref 8–23)
CO2: 30 mmol/L (ref 22–32)
Calcium: 8.5 mg/dL — ABNORMAL LOW (ref 8.9–10.3)
Chloride: 104 mmol/L (ref 98–111)
Creatinine, Ser: 1.13 mg/dL (ref 0.61–1.24)
GFR, Estimated: >60 mL/min (ref >60)
Glucose, Bld: 96 mg/dL (ref 70–99)
Potassium: 3.5 mmol/L (ref 3.5–5.1)
Sodium: 142 mmol/L (ref 135–145)

## 2021-05-18 MED ORDER — CLINDAMYCIN HCL 150 MG PO CAPS
450.0000 mg | ORAL_CAPSULE | Freq: Once | ORAL | Status: AC
  Administered 2021-05-18: 450 mg via ORAL
  Filled 2021-05-18: qty 3

## 2021-05-18 MED ORDER — OXYCODONE-ACETAMINOPHEN 5-325 MG PO TABS
1.0000 | ORAL_TABLET | Freq: Once | ORAL | Status: AC
  Administered 2021-05-18: 1 via ORAL
  Filled 2021-05-18: qty 1

## 2021-05-18 NOTE — ED Provider Notes (Signed)
Emergency Medicine Provider Triage Evaluation Note  [name] , a 60 y.o. male  was evaluated in triage.  Pt complains of dental pain.  He endorses a 1 week history of progressively worsening left upper dental pain.  He states the pain became severe last night has been persistent all day.  He states that it has been a while since he lost his, but has an appointment upcoming in July.  Patient states that he can still eat and drink, but is requiring soups and soft foods.  Denies any difficulty breathing.  Today he woke up and had significant left-sided facial swelling.  Denies any fevers or chills.  PMH notable for IDDM and CAD on Eliquis.  Review of Systems  Positive: Dental pain, facial swelling. Negative: Fevers, chills, difficulty swallowing or breathing  Physical Exam  BP (!) 159/89 (BP Location: Left Arm)   Pulse 67   Temp 98.7 F (37.1 C) (Oral)   Resp 18   Ht 6\' 2"  (1.88 m)   Wt (!) 150 kg   SpO2 94%   BMI 42.46 kg/m  Gen:   Awake, no distress   Resp:  Normal effort  MSK:   Moves extremities without difficulty  Other:  Patent oropharynx.  No uvular deviation.  Poor dentition throughout.  Significant tenderness in area of broken tooth, left upper gums.  Surrounding erythema and swelling.  No tongue swelling or floor of mouth swelling.  Left-sided maxillary region facial swelling which extends towards left infraorbital area.  Medical Decision Making  Medically screening exam initiated at 8:20 PM.  Appropriate orders placed.  CYLIS AYARS was informed that the remainder of the evaluation will be completed by another provider, this initial triage assessment does not replace that evaluation, and the importance of remaining in the ED until their evaluation is complete.  Will proceed with CT imaging.  Concern for maxillary abscess.  He will require antibiotics.   Corena Herter, PA-C 05/18/21 2020    Daleen Bo, MD 05/18/21 [phone]

## 2021-05-18 NOTE — ED Triage Notes (Signed)
Patient reports worsening left upper dental pain with swelling unrelieved by OTC pain medication onset last night .

## 2021-05-19 ENCOUNTER — Other Ambulatory Visit: Payer: Self-pay

## 2021-05-19 ENCOUNTER — Encounter (HOSPITAL_COMMUNITY): Payer: Self-pay

## 2021-05-19 ENCOUNTER — Ambulatory Visit (HOSPITAL_COMMUNITY)
Admission: EM | Admit: 2021-05-19 | Discharge: 2021-05-19 | Disposition: A | Discharge status: Home / Self Care | Payer: Medicare (Managed Care) | Attending: Physician Assistant | Admitting: Physician Assistant

## 2021-05-19 DIAGNOSIS — R220 Localized swelling, mass and lump, head: Secondary | ICD-10-CM | POA: Diagnosis not present

## 2021-05-19 DIAGNOSIS — K047 Periapical abscess without sinus: Secondary | ICD-10-CM

## 2021-05-19 MED ORDER — AMOXICILLIN-POT CLAVULANATE 875-125 MG PO TABS
1.0000 | ORAL_TABLET | Freq: Two times a day (BID) | ORAL | 0 refills | Status: AC
  Filled 2021-05-19 – 2021-06-06 (×2): qty 20, 10d supply, fill #0

## 2021-05-19 NOTE — ED Triage Notes (Addendum)
Patient presents to Urgent Care with complaints of upper left dental pain. He states his tooth broke off about 3 months ago. He has a dental appt in July. Pt concerned with possible abscess. Pt has increased swelling and tenderness to left side of face. Treating pain with Tylenol with no relief.   Denies fever.

## 2021-05-19 NOTE — ED Provider Notes (Signed)
[name]    CSN: [id number] Arrival date & time: 05/19/21  1316      History   Chief Complaint Chief Complaint  Patient presents with   Dental Pain    HPI [name] is a 60 y.o. male.   Patient presents today with a 2 to 3-day history of left-sided dental pain and associated facial swelling.  Patient has a history of broken tooth that occurred approximately 3 weeks ago.  He has not seen a dentist and does not have an appointment scheduled until July 2022.  He went to the emergency room yesterday and waited for many hours without being seen so left and came to urgent care.  He reports pain is rated 10 on a 0-10 pain scale, localized to left face without radiation, described as throbbing, no aggravating or alleviating factors identified.  He is able to eat and drink despite symptoms but has been subsisting on primarily soft and liquid foods.  Denies any muffled voice, shortness of breath, dysphagia.  Denies any recent antibiotic use.   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   Acute blood loss anemia    Angiodysplasia of stomach    Chronic anticoagulation    CHF exacerbation ([place]) 06/15/2020   AKI (acute kidney injury) ([place]) 06/15/2020   CKD (chronic kidney disease), stage III ([place]) 06/15/2020   Anemia due to chronic blood loss    Gastric AVM    Angiodysplasia of duodenum with hemorrhage    Acute on chronic systolic (congestive) heart failure ([place]) 04/05/2020   Anemia 04/05/2020   PAF (paroxysmal  atrial fibrillation) ([place]) 04/05/2020   OSA on CPAP 04/05/2020   Syncope and collapse 04/05/2020   Type 2 diabetes mellitus with hyperglycemia, with long-term current use of insulin ([place]) 12/03/2019   Diabetic polyneuropathy associated with type 2 diabetes mellitus ([place]) 12/03/2019   Hyperglycemia 12/03/2019   History of hyperglycemia 12/03/2019   Erectile dysfunction 12/03/2019   CHF (congestive heart failure) ([place]) 10/11/2019   Paranoid schizophrenia, chronic condition ([place]) 01/26/2015   Severe recurrent major depressive disorder with psychotic features ([place]) 01/26/2015   GAD (generalized anxiety disorder) 01/26/2015   OCD (obsessive compulsive disorder) 01/26/2015   Panic disorder without agoraphobia 01/26/2015   Insomnia 01/26/2015    Past Surgical History:  Procedure Laterality Date   BIOPSY  04/19/2021   Procedure: BIOPSY;  Surgeon: Doran Stabler, MD;  Location: [place];  Service: Gastroenterology;;   COLONOSCOPY WITH PROPOFOL N/A 04/16/2021   Procedure: COLONOSCOPY WITH PROPOFOL;  Surgeon: [name], MD;  Location: [place];  Service: Gastroenterology;  Laterality: N/A;   CORONARY STENT PLACEMENT     ENTEROSCOPY N/A 04/08/2020   Procedure: ENTEROSCOPY;  Surgeon: Doran Stabler, MD;  Location: [place];  Service: Gastroenterology;  Laterality: N/A;   ENTEROSCOPY N/A 06/17/2020   Procedure: ENTEROSCOPY;  Surgeon: Irene Shipper, MD;  Location: [facility] ENDOSCOPY;  Service: Endoscopy;  Laterality: N/A;   ENTEROSCOPY N/A  04/15/2021   Procedure: ENTEROSCOPY;  Surgeon: [name], MD;  Location: [place];  Service: Gastroenterology;  Laterality: N/A;   ENTEROSCOPY N/A 04/19/2021   Procedure: ENTEROSCOPY;  Surgeon: Doran Stabler, MD;  Location: [place];  Service: Gastroenterology;  Laterality: N/A;   GIVENS CAPSULE STUDY  04/16/2021   Procedure: GIVENS CAPSULE STUDY;  Surgeon: [name], MD;  Location: [place];  Service:  Gastroenterology;;   HEMOSTASIS CLIP PLACEMENT  04/08/2020   Procedure: HEMOSTASIS CLIP PLACEMENT;  Surgeon: Doran Stabler, MD;  Location: [place];  Service: Gastroenterology;;   HEMOSTASIS CONTROL  04/08/2020   Procedure: HEMOSTASIS CONTROL;  Surgeon: Doran Stabler, MD;  Location: [facility] ENDOSCOPY;  Service: Gastroenterology;;   HEMOSTASIS CONTROL  06/17/2020   Procedure: HEMOSTASIS CONTROL;  Surgeon: Irene Shipper, MD;  Location: [place];  Service: Endoscopy;;   HERNIA REPAIR     HOT HEMOSTASIS N/A 04/15/2021   Procedure: HOT HEMOSTASIS (ARGON PLASMA COAGULATION/BICAP);  Surgeon: [name], MD;  Location: [place];  Service: Gastroenterology;  Laterality: N/A;   ICD IMPLANT N/A 09/03/2020   Procedure: ICD IMPLANT;  Surgeon: [name], MD;  Location: [place] CV LAB;  Service: Cardiovascular;  Laterality: N/A;   POLYPECTOMY  04/16/2021   Procedure: POLYPECTOMY;  Surgeon: [name], MD;  Location: [facility] ENDOSCOPY;  Service: Gastroenterology;;   RIGHT/LEFT HEART CATH AND CORONARY ANGIOGRAPHY N/A 10/14/2019   Procedure: RIGHT/LEFT HEART CATH AND CORONARY ANGIOGRAPHY;  Surgeon: Belva Crome, MD;  Location: [place] CV LAB;  Service: Cardiovascular;  Laterality: N/A;   SUBMUCOSAL TATTOO INJECTION  04/19/2021   Procedure: SUBMUCOSAL TATTOO INJECTION;  Surgeon: Doran Stabler, MD;  Location: [place] ENDOSCOPY;  Service: Gastroenterology;;       Home Medications    Prior to Admission medications   Medication Sig Start Date End Date Taking? Authorizing Provider  amoxicillin-clavulanate (AUGMENTIN) 875-125 MG tablet Take 1 tablet by mouth every 12 (twelve) hours for 10 days. 05/19/21 05/29/21 Yes Dakari Stabler, Derry Skill, PA-C  acetaminophen (TYLENOL) 500 MG tablet Take 2 tablets (1,000 mg total) by mouth in the morning and at bedtime. 04/19/21   Armando Reichert, MD  albuterol (VENTOLIN HFA) 108 (90 Base) MCG/ACT inhaler INHALE 2 PUFFS INTO THE LUNGS EVERY 6 (SIX) HOURS  AS NEEDED FOR WHEEZING OR SHORTNESS OF BREATH. 01/19/21 01/19/22  Azzie Glatter, FNP  apixaban (ELIQUIS) 5 MG TABS tablet Take 1 tablet (5 mg total) by mouth 2 (two) times daily. 03/02/21 05/11/21  Lyda Jester M, PA-C  atorvastatin (LIPITOR) 40 MG tablet Take 1 tablet (40 mg total) by mouth daily at 6 PM. 04/21/21 07/25/21  [name], MD  blood glucose meter kit and supplies KIT Dispense based on patient and insurance preference. Use up to four times daily as directed. (FOR ICD-9 250.00, 250.01). 10/16/19   Barb Merino, MD  carvedilol (COREG) 6.25 MG tablet Take 1 tablet (6.25 mg total) by mouth 2 (two) times daily with a meal. 05/11/21   [place], Maricela Bo, FNP  Continuous Blood Gluc Receiver (FREESTYLE LIBRE 14 DAY READER) DEVI 1 each by Does not apply route as needed. 10/26/20   Azzie Glatter, FNP  cyclobenzaprine (FLEXERIL) 10 MG tablet Take 10 mg by mouth 3 (three) times daily as needed for muscle spasms.    [provider]  dapagliflozin propanediol (FARXIGA) 10 MG TABS tablet Take 1 tablet (10 mg total) by mouth daily before breakfast. 03/29/21   [name], MD  ferrous sulfate 325 (65 FE) MG tablet Take 1 tablet (325 mg total) by mouth every other day. 04/19/21   Armando Reichert, MD  furosemide (LASIX) 40 MG tablet Take 1.5 tablets (60 mg total) by mouth 2 (two) times daily. 05/11/21   [place], Maricela Bo, FNP  gabapentin (NEURONTIN) 300 MG capsule TAKE 2 CAPSULES (TOTAL = 600 MG), BY MOUTH, 3 TIMES A DAY. Patient taking differently: Take 600 mg by mouth 3 (three) times daily. 09/13/20 09/13/21  Azzie Glatter, FNP  glipiZIDE (GLUCOTROL) 10 MG tablet TAKE 1 TABLET (10 MG TOTAL) BY MOUTH 2 (TWO) TIMES DAILY BEFORE A MEAL. Patient taking differently: Take 10 mg by mouth 2 (two) times daily. 10/05/20 10/05/21  Azzie Glatter, FNP  insulin glargine (LANTUS) 100 UNIT/ML injection INJECT 0.2 MLS (20 UNITS TOTAL) INTO THE SKIN DAILY. Patient taking differently: Inject 20  Units into the skin daily. 10/06/20 10/06/21  Azzie Glatter, FNP  Insulin Lispro Prot & Lispro (HUMALOG MIX 75/25 KWIKPEN) (75-25) 100 UNIT/ML Kwikpen Inject 30 Units into the skin 2 (two) times daily. 02/18/20   Azzie Glatter, FNP  Insulin Pen Needle 31G X 6 MM MISC USE AS DIRECTED 11/04/20 11/04/21  Azzie Glatter, FNP  Insulin Syringe-Needle U-100 31G X 5/16" 0.3 ML MISC USE 3 TIMES DAILY TO INJECT INSULIN 10/26/20 10/26/21  Azzie Glatter, FNP  metFORMIN (GLUCOPHAGE) 500 MG tablet TAKE 1 TABLET (500 MG TOTAL) BY MOUTH 2 (TWO) TIMES DAILY WITH A MEAL. Patient taking differently: Take 500 mg by mouth 2 (two) times daily with a meal. 10/05/20 10/05/21  Azzie Glatter, FNP  Multiple Vitamins-Minerals (CENTRUM SILVER 50+MEN) TABS Take 1 tablet by mouth daily.    [provider]  pantoprazole (PROTONIX) 40 MG tablet Take 1 tablet (40 mg total) by mouth daily. 04/19/21 05/26/21  Armando Reichert, MD  potassium chloride SA (KLOR-CON) 20 MEQ tablet Take 2 tablets (40 mEq total) by mouth daily. 05/11/21   [place], Maricela Bo, FNP  QUEtiapine (SEROQUEL) 300 MG tablet TAKE 1 TABLET BY MOUTH AT BEDTIME. Patient taking differently: Take 300 mg by mouth at bedtime. 02/16/21 02/16/22    sacubitril-valsartan (ENTRESTO) 24-26 MG Take 1 tablet by mouth 2 (two) times daily. 05/11/21   Rafael Bihari, FNP  tetrahydrozoline 0.05 % ophthalmic solution Place 1 drop into both eyes daily.    [provider]  TRUEPLUS PEN NEEDLES 31G X 6 MM MISC USE AS DIRECTED 11/04/20   Azzie Glatter, FNP    Family History Family History  Problem Relation Age of Onset   Heart failure Mother    Mental illness Sister    Mental illness Sister     Social History Social History   Tobacco Use   Smoking status: Former    Pack years: 0.00   Smokeless tobacco: Never  Vaping Use   Vaping Use: Never used  Substance Use Topics   Alcohol use: Not Currently    Alcohol/week: 2.0 standard drinks    Types: 2  Cans of beer per week   Drug use: No     Allergies   Patient has no known allergies.   Review of Systems Review of Systems  Constitutional:  Negative for activity change, appetite change, fatigue and fever.  HENT:  Positive for dental problem and facial swelling. Negative for congestion, sneezing, sore throat, trouble swallowing and voice change.   Respiratory:  Negative for cough and shortness of breath.   Cardiovascular:  Negative for chest  pain.  Gastrointestinal:  Negative for abdominal pain, diarrhea, nausea and vomiting.  Neurological:  Negative for dizziness, light-headedness and headaches.    Physical Exam Triage Vital Signs ED Triage Vitals  Enc Vitals Group     BP 05/19/21 1442 (S) (!) 148/77     Pulse Rate 05/19/21 1442 63     Resp 05/19/21 1442 18     Temp 05/19/21 1442 98.9 F (37.2 C)     Temp Source 05/19/21 1442 Oral     SpO2 05/19/21 1442 98 %     Weight --      Height --      Head Circumference --      Peak Flow --      Pain Score 05/19/21 1437 10     Pain Loc --      Pain Edu? --      Excl. in [place]? --    No data found.  Updated Vital Signs BP (S) (!) 148/77 (BP Location: Left Arm)   Pulse 63   Temp 98.9 F (37.2 C) (Oral)   Resp 18   SpO2 98%   Visual Acuity Right Eye Distance:   Left Eye Distance:   Bilateral Distance:    Right Eye Near:   Left Eye Near:    Bilateral Near:     Physical Exam Vitals reviewed.  Constitutional:      General: He is awake.     Appearance: Normal appearance. He is normal weight. He is not ill-appearing.     Comments: Very pleasant male appears stated age in no acute distress  HENT:     Head: Normocephalic and atraumatic.     Comments: Significant swelling over left maxillary sinus.    Right Ear: Tympanic membrane, ear canal and external ear normal. Tympanic membrane is not erythematous or bulging.     Left Ear: Tympanic membrane, ear canal and external ear normal. Tympanic membrane is not erythematous or  bulging.     Nose: Nose normal.     Mouth/Throat:     Dentition: Abnormal dentition. Gingival swelling and dental abscesses present.     Pharynx: Uvula midline. Posterior oropharyngeal erythema present. No oropharyngeal exudate.      Comments: No evidence of Ludwig angina Cardiovascular:     Rate and Rhythm: Normal rate and regular rhythm.     Heart sounds: Normal heart sounds, S1 normal and S2 normal. No murmur heard. Pulmonary:     Effort: Pulmonary effort is normal. No accessory muscle usage or respiratory distress.     Breath sounds: Normal breath sounds. No stridor. No wheezing, rhonchi or rales.     Comments: Clear to auscultation Abdominal:     General: Bowel sounds are normal.     Palpations: Abdomen is soft.     Tenderness: There is no abdominal tenderness.  Lymphadenopathy:     Head:     Right side of head: No submental, submandibular or tonsillar adenopathy.     Left side of head: No submental, submandibular or tonsillar adenopathy.     Cervical: No cervical adenopathy.  Neurological:     Mental Status: He is alert.  Psychiatric:        Behavior: Behavior is cooperative.     UC Treatments / Results  Labs (all labs ordered are listed, but only abnormal results are displayed) Labs Reviewed - No data to display  EKG   Radiology No results found.  Procedures Procedures (including critical care time)  Medications Ordered in  UC Medications - No data to display  Initial Impression / Assessment and Plan / UC Course  I have reviewed the triage vital signs and the nursing notes.  Pertinent labs & imaging results that were available during my care of the patient were reviewed by me and considered in my medical decision making (see chart for details).      Vital signs and physical exam are reassuring today.  Given severity of swelling we discussed that if symptoms do not improve within 24 hours he should go to the emergency room to consider intravenous  antibiotics.  He was started on Augmentin.  He can use Tylenol and ibuprofen over-the-counter to manage symptoms.  Discussed the importance of following up with dentist and encouraged him to call to see if this dental appointment could be moved forward symptoms are likely to recur until broken tooth is repaired.  Discussed at length alarm symptoms that warrant emergent evaluation.  Strict return precautions given to which patient expressed understanding.  Final Clinical Impressions(s) / UC Diagnoses   Final diagnoses:  Dental infection  Dental abscess  Jaw swelling     Discharge Instructions      Take your antibiotic twice daily.  If you have any worsening symptoms you need to go to the emergency room as we discussed.  If you do not have significant improvement of symptoms after being on the medication for 24 to 48 hours you need to go to the ER.  Follow-up with dentist as we discussed.  If you have any fever, shortness of breath, trouble swallowing, increased pain you need to go to the ER.     ED Prescriptions     Medication Sig Dispense Auth. Provider   amoxicillin-clavulanate (AUGMENTIN) 875-125 MG tablet Take 1 tablet by mouth every 12 (twelve) hours for 10 days. 20 tablet [place], Derry Skill, PA-C      PDMP not reviewed this encounter.   Terrilee Croak, PA-C 05/19/21 1507

## 2021-05-19 NOTE — ED Notes (Signed)
Pt leaving AMA, stated he is going to the dentist in the am.

## 2021-05-19 NOTE — Discharge Instructions (Signed)
Take your antibiotic twice daily.  If you have any worsening symptoms you need to go to the emergency room as we discussed.  If you do not have significant improvement of symptoms after being on the medication for 24 to 48 hours you need to go to the ER.  Follow-up with dentist as we discussed.  If you have any fever, shortness of breath, trouble swallowing, increased pain you need to go to the ER.

## 2021-05-23 ENCOUNTER — Other Ambulatory Visit: Payer: Self-pay

## 2021-05-23 ENCOUNTER — Ambulatory Visit (HOSPITAL_COMMUNITY)
Admission: RE | Admit: 2021-05-23 | Discharge: 2021-05-23 | Disposition: A | Discharge status: Home / Self Care | Payer: Medicare (Managed Care) | Source: Ambulatory Visit | Attending: Internal Medicine | Admitting: Internal Medicine

## 2021-05-23 DIAGNOSIS — I5022 Chronic systolic (congestive) heart failure: Secondary | ICD-10-CM

## 2021-05-23 LAB — BASIC METABOLIC PANEL
Anion gap: 10 (ref 5–15)
BUN: 15 mg/dL (ref 8–23)
CO2: 29 mmol/L (ref 22–32)
Calcium: 8.8 mg/dL — ABNORMAL LOW (ref 8.9–10.3)
Chloride: 103 mmol/L (ref 98–111)
Creatinine, Ser: 1.23 mg/dL (ref 0.61–1.24)
GFR, Estimated: >60 mL/min (ref >60)
Glucose, Bld: 160 mg/dL — ABNORMAL HIGH (ref 70–99)
Potassium: 3.6 mmol/L (ref 3.5–5.1)
Sodium: 142 mmol/L (ref 135–145)

## 2021-05-25 ENCOUNTER — Other Ambulatory Visit (HOSPITAL_COMMUNITY): Payer: Self-pay

## 2021-05-25 ENCOUNTER — Other Ambulatory Visit: Payer: Self-pay

## 2021-05-25 MED ORDER — QUETIAPINE FUMARATE 300 MG PO TABS
ORAL_TABLET | ORAL | 0 refills | Status: DC
  Filled 2021-05-25: qty 30, 30d supply, fill #0

## 2021-05-25 NOTE — Progress Notes (Signed)
Paramedicine Encounter    Patient ID: [name], male    DOB: [birth year], 60 y.o.   MRN: [id number]   Patient Care Team: Pcp, No as PCP - General [name], MD as PCP - Cardiology (Cardiology) [name], MD as PCP - Advanced Heart Failure (Cardiology) [name], MD as PCP - Electrophysiology (Cardiology) [name], LCSW as Social Worker (Licensed Clinical Social Worker) [name], MD as Consulting Physician (Gastroenterology)  Patient Active Problem List   Diagnosis Date Noted   Adenomatous polyp of ascending colon    GI bleeding 04/13/2021   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   Acute blood loss anemia    Angiodysplasia of stomach    Chronic anticoagulation    CHF exacerbation ([place]) 06/15/2020   AKI (acute kidney injury) ([place]) 06/15/2020   CKD (chronic kidney disease), stage III ([place]) 06/15/2020   Anemia due to chronic blood loss    Gastric AVM    Angiodysplasia of duodenum with hemorrhage    Acute on chronic systolic (congestive) heart failure ([place]) 04/05/2020   Anemia 04/05/2020   PAF (paroxysmal atrial fibrillation) ([place]) 04/05/2020   OSA on CPAP 04/05/2020   Syncope and collapse 04/05/2020   Type 2 diabetes mellitus with hyperglycemia, with long-term current use of insulin ([place]) 12/03/2019   Diabetic polyneuropathy associated with type 2 diabetes mellitus ([place]) 12/03/2019   Hyperglycemia 12/03/2019   History of hyperglycemia 12/03/2019   Erectile dysfunction 12/03/2019   CHF (congestive heart failure) ([place]) 10/11/2019   Paranoid schizophrenia, chronic condition ([place]) 01/26/2015   Severe recurrent major depressive disorder with psychotic features ([place]) 01/26/2015   GAD (generalized anxiety disorder) 01/26/2015   OCD (obsessive compulsive disorder) 01/26/2015   Panic disorder without agoraphobia 01/26/2015   Insomnia 01/26/2015    Current Outpatient Medications:    acetaminophen (TYLENOL) 500 MG  tablet, Take 2 tablets (1,000 mg total) by mouth in the morning and at bedtime., Disp: 180 tablet, Rfl: 3   albuterol (VENTOLIN HFA) 108 (90 Base) MCG/ACT inhaler, INHALE 2 PUFFS INTO THE LUNGS EVERY 6 (SIX) HOURS AS NEEDED FOR WHEEZING OR SHORTNESS OF BREATH., Disp: 8 g, Rfl: 3   amoxicillin-clavulanate (AUGMENTIN) 875-125 MG tablet, Take 1 tablet by mouth every 12 (twelve) hours for 10 days., Disp: 20 tablet, Rfl: 0   apixaban (ELIQUIS) 5 MG TABS tablet, Take 1 tablet (5 mg total) by mouth 2 (two) times daily., Disp: 180 tablet, Rfl: 3   atorvastatin (LIPITOR) 40 MG tablet, Take 1 tablet (40 mg total) by mouth daily at 6 PM., Disp: 90 tablet, Rfl: 3   blood glucose meter kit and supplies KIT, Dispense based on patient and insurance preference. Use up to four times daily as directed. (FOR ICD-9 250.00, 250.01)., Disp: 1 each, Rfl: 0   carvedilol (COREG) 6.25 MG tablet, Take 1 tablet (6.25 mg total) by mouth 2 (two) times daily with a meal., Disp: 60 tablet, Rfl: 3   Continuous Blood Gluc Receiver (FREESTYLE LIBRE 14 DAY READER) DEVI, 1 each by Does not apply route as needed., Disp: 1 each, Rfl: 11   cyclobenzaprine (FLEXERIL) 10 MG tablet, Take 10 mg by mouth 3 (three) times daily as needed for muscle spasms., Disp: , Rfl:    dapagliflozin propanediol (FARXIGA) 10 MG TABS tablet, Take 1 tablet (10 mg total) by mouth daily before breakfast., Disp: 30 tablet, Rfl: 11   ferrous sulfate 325 (65 FE) MG tablet, Take 1 tablet (325 mg total) by mouth every  other day., Disp: 30 tablet, Rfl: 0   furosemide (LASIX) 40 MG tablet, Take 1.5 tablets (60 mg total) by mouth 2 (two) times daily., Disp: 90 tablet, Rfl: 3   gabapentin (NEURONTIN) 300 MG capsule, TAKE 2 CAPSULES (TOTAL = 600 MG), BY MOUTH, 3 TIMES A DAY. (Patient taking differently: Take 600 mg by mouth 3 (three) times daily.), Disp: 180 capsule, Rfl: 11   glipiZIDE (GLUCOTROL) 10 MG tablet, TAKE 1 TABLET (10 MG TOTAL) BY MOUTH 2 (TWO) TIMES DAILY BEFORE A  MEAL. (Patient taking differently: Take 10 mg by mouth 2 (two) times daily.), Disp: 60 tablet, Rfl: 6   insulin glargine (LANTUS) 100 UNIT/ML injection, INJECT 0.2 MLS (20 UNITS TOTAL) INTO THE SKIN DAILY. (Patient taking differently: Inject 20 Units into the skin daily.), Disp: 30 mL, Rfl: 3   Insulin Lispro Prot & Lispro (HUMALOG MIX 75/25 KWIKPEN) (75-25) 100 UNIT/ML Kwikpen, Inject 30 Units into the skin 2 (two) times daily., Disp: 15 mL, Rfl: 11   Insulin Pen Needle 31G X 6 MM MISC, USE AS DIRECTED, Disp: 100 each, Rfl: 0   Insulin Syringe-Needle U-100 31G X 5/16" 0.3 ML MISC, USE 3 TIMES DAILY TO INJECT INSULIN, Disp: 300 each, Rfl: 3   metFORMIN (GLUCOPHAGE) 500 MG tablet, TAKE 1 TABLET (500 MG TOTAL) BY MOUTH 2 (TWO) TIMES DAILY WITH A MEAL. (Patient taking differently: Take 500 mg by mouth 2 (two) times daily with a meal.), Disp: 60 tablet, Rfl: 6   Multiple Vitamins-Minerals (CENTRUM SILVER 50+MEN) TABS, Take 1 tablet by mouth daily., Disp: , Rfl:    pantoprazole (PROTONIX) 40 MG tablet, Take 1 tablet (40 mg total) by mouth daily., Disp: 30 tablet, Rfl: 0   potassium chloride SA (KLOR-CON) 20 MEQ tablet, Take 2 tablets (40 mEq total) by mouth daily., Disp: 60 tablet, Rfl: 3   QUEtiapine (SEROQUEL) 300 MG tablet, TAKE 1 TABLET BY MOUTH AT BEDTIME. (Patient taking differently: Take 300 mg by mouth at bedtime.), Disp: 90 tablet, Rfl: 0   QUEtiapine (SEROQUEL) 300 MG tablet, take one tab by mouth at bedtime, Disp: 30 tablet, Rfl: 0   sacubitril-valsartan (ENTRESTO) 24-26 MG, Take 1 tablet by mouth 2 (two) times daily., Disp: 180 tablet, Rfl: 3   tetrahydrozoline 0.05 % ophthalmic solution, Place 1 drop into both eyes daily., Disp: , Rfl:    TRUEPLUS PEN NEEDLES 31G X 6 MM MISC, USE AS DIRECTED, Disp: 100 each, Rfl: 0 No Known Allergies   Social History   Socioeconomic History   Marital status: Legally Separated    Spouse name: Not on file   Number of children: Not on file   Years of  education: Not on file   Highest education level: Not on file  Occupational History   Not on file  Tobacco Use   Smoking status: Former    Pack years: 0.00   Smokeless tobacco: Never  Vaping Use   Vaping Use: Never used  Substance and Sexual Activity   Alcohol use: Not Currently    Alcohol/week: 2.0 standard drinks    Types: 2 Cans of beer per week   Drug use: No   Sexual activity: Yes  Other Topics Concern   Not on file  Social History Narrative   Not on file   Social Determinants of Health   Financial Resource Strain: Not on file  Food Insecurity: No Food Insecurity   Worried About [place] in the Last Year: Never true   Ran Out  of Food in the Last Year: Never true  Transportation Needs: Unmet Transportation Needs   Lack of Transportation (Medical): Yes   Lack of Transportation (Non-Medical): Yes  Physical Activity: Not on file  Stress: Not on file  Social Connections: Not on file  Intimate Partner Violence: Not on file    Physical Exam Vitals reviewed.  Constitutional:      Appearance: Normal appearance. He is normal weight.  HENT:     Head: Normocephalic.     Nose: Nose normal.     Mouth/Throat:     Mouth: Mucous membranes are moist.     Pharynx: Oropharynx is clear.  Eyes:     Conjunctiva/sclera: Conjunctivae normal.     Pupils: Pupils are equal, round, and reactive to light.  Cardiovascular:     Rate and Rhythm: Normal rate and regular rhythm.     Pulses: Normal pulses.     Heart sounds: Normal heart sounds.  Pulmonary:     Effort: Pulmonary effort is normal.     Breath sounds: Normal breath sounds.  Abdominal:     General: Abdomen is flat.     Palpations: Abdomen is soft.  Musculoskeletal:        General: No swelling. Normal range of motion.     Cervical back: Normal range of motion.     Right lower leg: No edema.     Left lower leg: No edema.  Skin:    General: Skin is warm and dry.     Capillary Refill: Capillary refill takes less  than 2 seconds.  Neurological:     General: No focal deficit present.     Mental Status: He is alert. Mental status is at baseline.  Psychiatric:        Mood and Affect: Mood normal.    Arrived for home visit for [facility] who was alert and oriented reporting to be feeling okay. [name] stated he has had some mild shortness of breath on exertion. He denied chest pain or dizziness. Lung sounds clear with no lower leg edema noted. He was seen in the UC recently for an oral abscess and was given a 10 day Augementin regimen in which he has been taking. [name] says that is feeling much better. I obtained vitals and assessment as noted. Medications reviewed and confirmed. Pill boxes (2) filled accordingly. [name] and I reviewed appointments and refills. Home visit complete.   CBG- 110   Refills: Lantus Syringes  Pantoprazole Metformin Gabapentin   SAMPLES NEEDED: Koren Shiver    REMINDER- DENTIST July 12 AT 2:30 [facility]  [address]  Future Appointments  Date Time Provider [place]  06/03/2021  7:40 AM [facility] DEVICE REMOTES [facility]  06/06/2021  2:00 PM [facility] PA/NP [facility] None  06/27/2021 10:40 AM [name], NP [facility] None  09/02/2021  7:40 AM [facility] DEVICE REMOTES [facility]  09/30/2021  1:20 PM [name], NP [place] None  12/02/2021  7:40 AM [facility] DEVICE REMOTES [facility]  03/03/2022  7:40 AM [facility] DEVICE REMOTES [facility]     ACTION: Home visit completed

## 2021-05-26 ENCOUNTER — Other Ambulatory Visit: Payer: Self-pay

## 2021-05-26 ENCOUNTER — Other Ambulatory Visit: Payer: Self-pay | Admitting: Nurse Practitioner

## 2021-05-26 ENCOUNTER — Other Ambulatory Visit: Payer: Self-pay | Admitting: Psychiatry

## 2021-05-26 MED FILL — Insulin Syringe/Needle U-100 0.3 ML 31 x 5/16": 90 days supply | Qty: 300 | Fill #0 | Status: CN

## 2021-05-26 MED FILL — Insulin Glargine Inj 100 Unit/ML: SUBCUTANEOUS | 84 days supply | Qty: 30 | Fill #0 | Status: CN

## 2021-05-26 MED FILL — Gabapentin Cap 300 MG: ORAL | 30 days supply | Qty: 180 | Fill #3 | Status: CN

## 2021-05-26 MED FILL — Glipizide Tab 10 MG: ORAL | 30 days supply | Qty: 60 | Fill #2 | Status: CN

## 2021-05-27 ENCOUNTER — Other Ambulatory Visit: Payer: Self-pay

## 2021-05-27 ENCOUNTER — Other Ambulatory Visit: Payer: Self-pay | Admitting: Nurse Practitioner

## 2021-05-27 MED ORDER — METFORMIN HCL 500 MG PO TABS
ORAL_TABLET | Freq: Two times a day (BID) | ORAL | 6 refills | Status: DC
Start: 2021-05-27 — End: 2021-12-23
  Filled 2021-05-27 – 2021-06-08 (×2): qty 60, 30d supply, fill #0
  Filled 2021-07-04 – 2021-07-11 (×2): qty 60, 30d supply, fill #1
  Filled 2021-08-16: qty 60, 30d supply, fill #2
  Filled 2021-09-13 – 2021-09-22 (×2): qty 60, 30d supply, fill #3
  Filled 2021-10-18 – 2021-10-25 (×2): qty 60, 30d supply, fill #4
  Filled 2021-11-15 – 2021-11-16 (×2): qty 60, 30d supply, fill #5

## 2021-05-31 ENCOUNTER — Other Ambulatory Visit: Payer: Self-pay

## 2021-05-31 MED ORDER — PANTOPRAZOLE SODIUM 40 MG PO TBEC
40.0000 mg | DELAYED_RELEASE_TABLET | Freq: Every day | ORAL | 0 refills | Status: DC
  Filled 2021-05-31 – 2021-06-08 (×2): qty 30, 30d supply, fill #0

## 2021-06-02 ENCOUNTER — Other Ambulatory Visit: Payer: Self-pay

## 2021-06-03 ENCOUNTER — Other Ambulatory Visit: Payer: Self-pay

## 2021-06-03 ENCOUNTER — Ambulatory Visit (INDEPENDENT_AMBULATORY_CARE_PROVIDER_SITE_OTHER): Payer: Medicare (Managed Care)

## 2021-06-03 DIAGNOSIS — I5022 Chronic systolic (congestive) heart failure: Secondary | ICD-10-CM

## 2021-06-04 NOTE — Progress Notes (Signed)
ADVANCED HEART FAILURE CLINIC NOTE  PCP: Pcp, No Cardiology: Dr. Radford Pax HF Cardiology: Dr. Aundra Dubin  60 y.o. with history of chronic systolic CHF, CAD, type 2 diabetes, paroxysmal atrial fibrillation, and schizophrenia was referred by Dr. Radford Pax for evaluation of CHF.  Patient had OM2 PCI in 2012.  In 11/20, he was admitted with CHF. Echo showed EF 25-30% with diffuse hypokinesis.  LHC was done, showing occluded OM2 at prior stent, 90% D1 stenosis, and extensive diffuse RCA disease.  No intervention.  He was thought to be in paroxysmal atrial fibrillation during this appointment and apixaban was started.   He does not smoke, rarely drinks, and does not use drugs.  His mother had "heart problems."    He can write his name only. He is only able to read a few words. Thinks he completed the 7th grade.    Echo in 5/21 showed EF 30% with diffuse hypokinesis, mild LVH, PASP 38, mildly decreased RV systolic function, IVC dilated. Biotronik ICD placed.   He was hospitalized in 7/21 with upper GI bleeding and CHF exacerbation.  EGD showed duodenal AVMs, treated with APC. He was diuresed.   Clinic visit 5/22 he was volume overloaded, weight up 19 lbs, and diuretics increased and Farxiga started.   He was admitted to [facility] a couple weeks later (04/13/21) for a/c CHF. He was diuresed with IV lasix. Hospitalization complicated by GIB & AKI. Enteroscopy performed on 5/20 which showed 2 small angioectasias with no bleeding in the third portion of the duodenum. Colonoscopy on 5/21, showed multiple polyps which were biopsied. No source of bleeding found. He had pill endoscopy 5/22 which showed non bleeding AVM. Pt had repeat enteroscopy which showed non bleeding jejunal ulcer. Eliquis held during admission but restarted at discharge. Home hydralazine, spiro, Entresto, and Imdur stopped in setting of GIB; carvedilol dose decreased at discharge.  He returned 6/22 for post hospitalization HF follow up. Up 10 lbs per  paramedicine. NYHA II symptoms. Entresto restarted and lasix increased.  Today he returns for HF follow up. Overall feeling fine. SOB with carrying groceries. Denies increasing SOB, CP, dizziness, edema, or PND/Orthopnea. Appetite ok. No fever or chills. Weight at home 329 pounds. Taking all medications.  Does not smoke. Eats out 2x/week.  Reds: 37%  Labs (1/21): LDL 66, HDL 42, hgb 11.3, K 4.7, creatinine 1.32 Labs (4/21): K 5, creatinine 1.37 Labs (7/21): K 4.1, creatinine 1.26, hgb 9.3 Labs (8/21): LDL 51, K 3.8, creatinine 1.3 Labs (11/21): K 4, creatinine 1.42, hgb 13.1, hgbA1c 11.7 Labs (2/22): HgbA1c 7.7 Labs (3/22): K 4.4, creatinine 1.27, pro-BNP 249. Labs (5/22): K 4.4, creatinine 1.22,  PMH:  1. Atrial fibrillation: Paroxysmal 2. Type 2 diabetes 3. HTN 4. Hyperlipidemia 5. Schizophrenia 6. CAD: PCI OM2 in 2012.  - LHC (11/20): 90% D1 stenosis, totally occluded OM2 at stent, serial 85%/70%/60% RCA stenoses.  7. 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  Biotronik ICD.  - Echo (11/20): EF 25-30%, global hypokinesis.  - Echo (5/21): EF 30% with diffuse hypokinesis, mild LVH, PASP 38, mildly decreased RV systolic function, IVC dilated. 8. Upper GI bleeding: 7/21, duodenal AVMs treated with APC.  9. OSA: Does not use CPAP regularly.   Social History   Socioeconomic History   Marital status: Legally Separated    Spouse name: Not on file   Number of children: Not on file   Years of education: Not on file   Highest education level: Not on file  Occupational  History   Not on file  Tobacco Use   Smoking status: Former    Pack years: 0.00   Smokeless tobacco: Never  Vaping Use   Vaping Use: Never used  Substance and Sexual Activity   Alcohol use: Not Currently    Alcohol/week: 2.0 standard drinks    Types: 2 Cans of beer per week   Drug use: No   Sexual activity: Yes  Other Topics Concern   Not on file  Social History Narrative    Not on file   Social Determinants of Health   Financial Resource Strain: Not on file  Food Insecurity: No Food Insecurity   Worried About Charity fundraiser in the Last Year: Never true   Ran Out of Food in the Last Year: Never true  Transportation Needs: Unmet Transportation Needs   Lack of Transportation (Medical): Yes   Lack of Transportation (Non-Medical): Yes  Physical Activity: Not on file  Stress: Not on file  Social Connections: Not on file  Intimate Partner Violence: Not on file   Family History  Problem Relation Age of Onset   Heart failure Mother    Mental illness Sister    Mental illness Sister    ROS: All systems reviewed and negative except as per HPI.  Current Meds  Medication Sig   acetaminophen (TYLENOL) 500 MG tablet Take 2 tablets (1,000 mg total) by mouth in the morning and at bedtime.   albuterol (VENTOLIN HFA) 108 (90 Base) MCG/ACT inhaler INHALE 2 PUFFS INTO THE LUNGS EVERY 6 (SIX) HOURS AS NEEDED FOR WHEEZING OR SHORTNESS OF BREATH.   apixaban (ELIQUIS) 5 MG TABS tablet Take 1 tablet (5 mg total) by mouth 2 (two) times daily.   atorvastatin (LIPITOR) 40 MG tablet Take 1 tablet (40 mg total) by mouth daily at 6 PM.   blood glucose meter kit and supplies KIT Dispense based on patient and insurance preference. Use up to four times daily as directed. (FOR ICD-9 250.00, 250.01).   carvedilol (COREG) 6.25 MG tablet Take 1 tablet (6.25 mg total) by mouth 2 (two) times daily with a meal.   Continuous Blood Gluc Receiver (FREESTYLE LIBRE 14 DAY READER) DEVI 1 each by Does not apply route as needed.   cyclobenzaprine (FLEXERIL) 10 MG tablet Take 10 mg by mouth 3 (three) times daily as needed for muscle spasms.   dapagliflozin propanediol (FARXIGA) 10 MG TABS tablet Take 1 tablet (10 mg total) by mouth daily before breakfast.   ferrous sulfate 325 (65 FE) MG tablet Take 1 tablet (325 mg total) by mouth every other day.   furosemide (LASIX) 40 MG tablet Take 1.5  tablets (60 mg total) by mouth 2 (two) times daily.   gabapentin (NEURONTIN) 300 MG capsule TAKE 2 CAPSULES (TOTAL = 600 MG), BY MOUTH, 3 TIMES A DAY.   glipiZIDE (GLUCOTROL) 10 MG tablet TAKE 1 TABLET (10 MG TOTAL) BY MOUTH 2 (TWO) TIMES DAILY BEFORE A MEAL.   insulin glargine (LANTUS) 100 UNIT/ML injection INJECT 0.2 MLS (20 UNITS TOTAL) INTO THE SKIN DAILY.   Insulin Lispro Prot & Lispro (HUMALOG MIX 75/25 KWIKPEN) (75-25) 100 UNIT/ML Kwikpen Inject 30 Units into the skin 2 (two) times daily.   Insulin Pen Needle 31G X 6 MM MISC USE AS DIRECTED   Insulin Syringe-Needle U-100 31G X 5/16" 0.3 ML MISC USE 3 TIMES DAILY TO INJECT INSULIN   metFORMIN (GLUCOPHAGE) 500 MG tablet TAKE 1 TABLET (500 MG TOTAL) BY MOUTH 2 (TWO)  TIMES DAILY WITH A MEAL.   Multiple Vitamins-Minerals (CENTRUM SILVER 50+MEN) TABS Take 1 tablet by mouth daily.   pantoprazole (PROTONIX) 40 MG tablet Take 1 tablet (40 mg total) by mouth daily.   potassium chloride SA (KLOR-CON) 20 MEQ tablet Take 2 tablets (40 mEq total) by mouth daily.   QUEtiapine (SEROQUEL) 300 MG tablet TAKE 1 TABLET BY MOUTH AT BEDTIME.   QUEtiapine (SEROQUEL) 300 MG tablet take one tab by mouth at bedtime   sacubitril-valsartan (ENTRESTO) 24-26 MG Take 1 tablet by mouth 2 (two) times daily.   tetrahydrozoline 0.05 % ophthalmic solution Place 1 drop into both eyes daily.   TRUEPLUS PEN NEEDLES 31G X 6 MM MISC USE AS DIRECTED   BP 110/69   Pulse 71   Wt (!) 146.7 kg (323 lb 6.4 oz)   SpO2 94%   BMI 41.52 kg/m   Wt Readings from Last 3 Encounters:  06/06/21 (!) 146.7 kg (323 lb 6.4 oz)  05/25/21 (!) 149.2 kg (329 lb)  05/18/21 (!) 150 kg (330 lb 11 oz)   Current Outpatient Medications on File Prior to Encounter  Medication Sig Dispense Refill   acetaminophen (TYLENOL) 500 MG tablet Take 2 tablets (1,000 mg total) by mouth in the morning and at bedtime. 180 tablet 3   albuterol (VENTOLIN HFA) 108 (90 Base) MCG/ACT inhaler INHALE 2 PUFFS INTO THE  LUNGS EVERY 6 (SIX) HOURS AS NEEDED FOR WHEEZING OR SHORTNESS OF BREATH. 8 g 3   apixaban (ELIQUIS) 5 MG TABS tablet Take 1 tablet (5 mg total) by mouth 2 (two) times daily. 180 tablet 3   atorvastatin (LIPITOR) 40 MG tablet Take 1 tablet (40 mg total) by mouth daily at 6 PM. 90 tablet 3   blood glucose meter kit and supplies KIT Dispense based on patient and insurance preference. Use up to four times daily as directed. (FOR ICD-9 250.00, 250.01). 1 each 0   carvedilol (COREG) 6.25 MG tablet Take 1 tablet (6.25 mg total) by mouth 2 (two) times daily with a meal. 60 tablet 3   Continuous Blood Gluc Receiver (FREESTYLE LIBRE 14 DAY READER) DEVI 1 each by Does not apply route as needed. 1 each 11   cyclobenzaprine (FLEXERIL) 10 MG tablet Take 10 mg by mouth 3 (three) times daily as needed for muscle spasms.     dapagliflozin propanediol (FARXIGA) 10 MG TABS tablet Take 1 tablet (10 mg total) by mouth daily before breakfast. 30 tablet 11   ferrous sulfate 325 (65 FE) MG tablet Take 1 tablet (325 mg total) by mouth every other day. 30 tablet 0   furosemide (LASIX) 40 MG tablet Take 1.5 tablets (60 mg total) by mouth 2 (two) times daily. 90 tablet 3   gabapentin (NEURONTIN) 300 MG capsule TAKE 2 CAPSULES (TOTAL = 600 MG), BY MOUTH, 3 TIMES A DAY. 180 capsule 11   glipiZIDE (GLUCOTROL) 10 MG tablet TAKE 1 TABLET (10 MG TOTAL) BY MOUTH 2 (TWO) TIMES DAILY BEFORE A MEAL. 60 tablet 6   insulin glargine (LANTUS) 100 UNIT/ML injection INJECT 0.2 MLS (20 UNITS TOTAL) INTO THE SKIN DAILY. 30 mL 3   Insulin Lispro Prot & Lispro (HUMALOG MIX 75/25 KWIKPEN) (75-25) 100 UNIT/ML Kwikpen Inject 30 Units into the skin 2 (two) times daily. 15 mL 11   Insulin Pen Needle 31G X 6 MM MISC USE AS DIRECTED 100 each 0   Insulin Syringe-Needle U-100 31G X 5/16" 0.3 ML MISC USE 3 TIMES DAILY TO INJECT  INSULIN 300 each 3   metFORMIN (GLUCOPHAGE) 500 MG tablet TAKE 1 TABLET (500 MG TOTAL) BY MOUTH 2 (TWO) TIMES DAILY WITH A MEAL. 60  tablet 6   Multiple Vitamins-Minerals (CENTRUM SILVER 50+MEN) TABS Take 1 tablet by mouth daily.     pantoprazole (PROTONIX) 40 MG tablet Take 1 tablet (40 mg total) by mouth daily. 30 tablet 0   potassium chloride SA (KLOR-CON) 20 MEQ tablet Take 2 tablets (40 mEq total) by mouth daily. 60 tablet 3   QUEtiapine (SEROQUEL) 300 MG tablet TAKE 1 TABLET BY MOUTH AT BEDTIME. 90 tablet 0   QUEtiapine (SEROQUEL) 300 MG tablet take one tab by mouth at bedtime 30 tablet 0   sacubitril-valsartan (ENTRESTO) 24-26 MG Take 1 tablet by mouth 2 (two) times daily. 180 tablet 3   tetrahydrozoline 0.05 % ophthalmic solution Place 1 drop into both eyes daily.     TRUEPLUS PEN NEEDLES 31G X 6 MM MISC USE AS DIRECTED 100 each 0   No current facility-administered medications on file prior to encounter.   BP 110/69   Pulse 71   Wt (!) 146.7 kg (323 lb 6.4 oz)   SpO2 94%   BMI 41.52 kg/m   Wt Readings from Last 3 Encounters:  06/06/21 (!) 146.7 kg (323 lb 6.4 oz)  05/25/21 (!) 149.2 kg (329 lb)  05/18/21 (!) 150 kg (330 lb 11 oz)   Physical Exam: General:  NAD. No resp difficulty HEENT: Normal Neck: Supple. No JVD. Carotids 2+ bilat; no bruits. No lymphadenopathy or thryomegaly appreciated. Cor: PMI nondisplaced. Regular rate & rhythm. No rubs, gallops or murmurs. Lungs: Clear Abdomen: Obese, nontender, nondistended. No hepatosplenomegaly. No bruits or masses. Good bowel sounds. Extremities: No cyanosis, clubbing, rash, edema Neuro: Alert & oriented x 3, cranial nerves grossly intact. Moves all 4 extremities w/o difficulty. Affect pleasant.  Assessment/Plan: 1. Chronic systolic CHF: Echo in 54/56 with EF 25-30%, echo 5/21 with EF 30% with mildly decreased RV systolic function. Biotronik ICD.  Suspect mixed ischemic/nonischemic cardiomyopathy.  Weight stable, NYHA class II-early III symptoms although functional class difficult to assess due to general physical inactivity and body habitus. He is not volume  overloaded on exam.  - Restart spiro 12.5 mg q hs. BMET today; repeat 10 days & in 4 weeks. - Continue Entresto 24/26/mg bid.  - Continue lasix 60 mg bid + 40 mEq of KCL daily.  - Continue Coreg 6.25 mg bid.   - Continue Farxiga 10 mg daily. No GU symptoms. - Hold off on adding back hydral + Imdur. - Reinforced low sodium diet.  2. CAD: Last cath in 11/20 with occluded OM2 stent, 90% D1, severe diffuse disease in the RCA (no intervention). No recent chest pain.  - No ASA given apixaban use.  - Continue atorvastatin. Check lipids today. - We discussed healthier dietary choices.  3. HTN: Better today. - Needs updated sleep study and CPAP. 4. Atrial fibrillation: Paroxysmal.  He is regular on exam today. Denies overt bleeding.  - Continue apixaban 5 mg bid. No bleeding issues. 5. DMII: Continue home regimen. A1c 7.1 (5/22). 6. OSA: CPAP machine dated and does not work. Updated sleep study has been ordered. 7. Upper GI bleeding: Occurred in 7/21 s/p APC to duodenal AVMs.  GIB 5/22 with non bleeding AVM and non bleeding jejunal ulcer. Now back on Eliquis. No overt bleeding.   8. Obesity: Weight down today. - Discussed healthier eating choices and limiting salt and fluids.  Followup in  4-6 weeks with APP to titrate GDMT.   [place], [place] 06/06/2021

## 2021-06-05 LAB — CUP PACEART REMOTE DEVICE CHECK
Battery Remaining Percentage: 100 %
Battery Voltage: 3.1 V
Brady Statistic AS VP Percent: 0 %
Brady Statistic AS VS Percent: 97 %
Brady Statistic RV Percent Paced: 0 %
Date Time Interrogation Session: 20220707022100
HighPow Impedance: 73 Ohm
Implantable Lead Implant Date: 20211008
Implantable Lead Location: 753860
Implantable Lead Model: 436910
Implantable Lead Serial Number: 81404997
Implantable Pulse Generator Implant Date: 20211008
Lead Channel Impedance Value: 461 Ohm
Lead Channel Pacing Threshold Amplitude: 0.4 V
Lead Channel Pacing Threshold Pulse Width: 0.4 ms
Lead Channel Sensing Intrinsic Amplitude: 1.1 mV
Lead Channel Sensing Intrinsic Amplitude: 19.9 mV
Lead Channel Setting Pacing Amplitude: 2 V
Lead Channel Setting Pacing Pulse Width: 0.4 ms
Lead Channel Setting Sensing Sensitivity: 0.8 mV
Pulse Gen Model: 429525
Pulse Gen Serial Number: 84810752

## 2021-06-06 ENCOUNTER — Other Ambulatory Visit: Payer: Self-pay

## 2021-06-06 ENCOUNTER — Ambulatory Visit (HOSPITAL_COMMUNITY)
Admission: RE | Admit: 2021-06-06 | Discharge: 2021-06-06 | Disposition: A | Discharge status: Home / Self Care | Payer: Medicare (Managed Care) | Source: Ambulatory Visit | Attending: Family Medicine | Admitting: Family Medicine

## 2021-06-06 ENCOUNTER — Telehealth (HOSPITAL_COMMUNITY): Payer: Self-pay

## 2021-06-06 ENCOUNTER — Encounter (HOSPITAL_COMMUNITY): Payer: Self-pay

## 2021-06-06 VITALS — BP 110/69 | HR 71 | Wt 323.4 lb

## 2021-06-06 DIAGNOSIS — Z87891 Personal history of nicotine dependence: Secondary | ICD-10-CM | POA: Insufficient documentation

## 2021-06-06 DIAGNOSIS — Z955 Presence of coronary angioplasty implant and graft: Secondary | ICD-10-CM | POA: Insufficient documentation

## 2021-06-06 DIAGNOSIS — G4733 Obstructive sleep apnea (adult) (pediatric): Secondary | ICD-10-CM | POA: Diagnosis not present

## 2021-06-06 DIAGNOSIS — E1165 Type 2 diabetes mellitus with hyperglycemia: Secondary | ICD-10-CM

## 2021-06-06 DIAGNOSIS — Z794 Long term (current) use of insulin: Secondary | ICD-10-CM | POA: Insufficient documentation

## 2021-06-06 DIAGNOSIS — I2510 Atherosclerotic heart disease of native coronary artery without angina pectoris: Secondary | ICD-10-CM | POA: Diagnosis not present

## 2021-06-06 DIAGNOSIS — Z8719 Personal history of other diseases of the digestive system: Secondary | ICD-10-CM

## 2021-06-06 DIAGNOSIS — Z79899 Other long term (current) drug therapy: Secondary | ICD-10-CM | POA: Diagnosis not present

## 2021-06-06 DIAGNOSIS — I10 Essential (primary) hypertension: Secondary | ICD-10-CM | POA: Diagnosis not present

## 2021-06-06 DIAGNOSIS — E669 Obesity, unspecified: Secondary | ICD-10-CM | POA: Diagnosis not present

## 2021-06-06 DIAGNOSIS — I110 Hypertensive heart disease with heart failure: Secondary | ICD-10-CM | POA: Insufficient documentation

## 2021-06-06 DIAGNOSIS — I5022 Chronic systolic (congestive) heart failure: Secondary | ICD-10-CM | POA: Diagnosis not present

## 2021-06-06 DIAGNOSIS — Z7901 Long term (current) use of anticoagulants: Secondary | ICD-10-CM | POA: Insufficient documentation

## 2021-06-06 DIAGNOSIS — I480 Paroxysmal atrial fibrillation: Secondary | ICD-10-CM | POA: Diagnosis not present

## 2021-06-06 DIAGNOSIS — E119 Type 2 diabetes mellitus without complications: Secondary | ICD-10-CM | POA: Diagnosis not present

## 2021-06-06 DIAGNOSIS — Z8249 Family history of ischemic heart disease and other diseases of the circulatory system: Secondary | ICD-10-CM | POA: Diagnosis not present

## 2021-06-06 LAB — BASIC METABOLIC PANEL
Anion gap: 5 (ref 5–15)
BUN: 13 mg/dL (ref 8–23)
CO2: 30 mmol/L (ref 22–32)
Calcium: 9.1 mg/dL (ref 8.9–10.3)
Chloride: 103 mmol/L (ref 98–111)
Creatinine, Ser: 0.98 mg/dL (ref 0.61–1.24)
GFR, Estimated: >60 mL/min (ref >60)
Glucose, Bld: 156 mg/dL — ABNORMAL HIGH (ref 70–99)
Potassium: 4.3 mmol/L (ref 3.5–5.1)
Sodium: 138 mmol/L (ref 135–145)

## 2021-06-06 LAB — LIPID PANEL
Cholesterol: 116 mg/dL (ref 0–200)
HDL: 35 mg/dL — ABNORMAL LOW (ref >40)
LDL Cholesterol: 58 mg/dL (ref 0–99)
Total CHOL/HDL Ratio: 3.3 RATIO
Triglycerides: 117 mg/dL (ref <150)
VLDL: 23 mg/dL (ref 0–40)

## 2021-06-06 MED ORDER — SPIRONOLACTONE 25 MG PO TABS
12.5000 mg | ORAL_TABLET | Freq: Every day | ORAL | 3 refills | Status: DC
  Filled 2021-06-06: qty 45, 90d supply, fill #0
  Filled 2021-09-05 – 2021-09-21 (×3): qty 45, 90d supply, fill #1

## 2021-06-06 NOTE — Addendum Note (Signed)
Encounter addended by: Shonna Chock, CMA on: 3/00/5110 4:26 PM  Actions taken: Clinical Note Signed

## 2021-06-06 NOTE — Patient Instructions (Signed)
Labs done today. We will contact you only if your labs are abnormal.  START Spironolactone 12.5mg  (1/2 tablet) by mouth daily at bedtime.  No other medication changes were made. Please continue all current medications as prescribed.  Your physician recommends that you schedule a follow-up appointment in: 7-10 days for a lab only appointment,4 weeks for a lab only appointment and in 6 weeks with our APP Clinic here in our office.  If you have any questions or concerns before your next appointment please send [place] a message through [place] or call our office at [phone].    TO LEAVE A MESSAGE FOR THE NURSE SELECT OPTION 2, PLEASE LEAVE A MESSAGE INCLUDING: YOUR NAME DATE OF BIRTH CALL BACK NUMBER REASON FOR CALL**this is important as we prioritize the call backs  YOU WILL RECEIVE A CALL BACK THE SAME DAY AS LONG AS YOU CALL BEFORE 4:00 PM   Do the following things EVERYDAY: Weigh yourself in the morning before breakfast. Write it down and keep it in a log. Take your medicines as prescribed Eat low salt foods--Limit salt (sodium) to 2000 mg per day.  Stay as active as you can everyday Limit all fluids for the day to less than 2 liters   At the [facility], you and your health needs are our priority. As part of our continuing mission to provide you with exceptional heart care, we have created designated Provider Care Teams. These Care Teams include your primary Cardiologist (physician) and Advanced Practice Providers (APPs- Physician Assistants and Nurse Practitioners) who all work together to provide you with the care you need, when you need it.   You may see any of the following providers on your designated Care Team at your next follow up: Dr Glori Bickers Dr Haynes Kerns, NP Lyda Jester, [place] [name], PharmD   Please be sure to bring in all your medications bottles to every appointment.

## 2021-06-06 NOTE — Telephone Encounter (Signed)
Spoke to [name] and reminded him of medications ready for pick up at [facility] as well as notified HF clinic staff to provide him with 5mg  BID eliquis samples for our appointment Wednesday afternoon in the home. [name] also reminded of dental appointment for tomorrow. Call complete.

## 2021-06-06 NOTE — Progress Notes (Signed)
ReDS Vest / Clip - 06/06/21 1400       ReDS Vest / Clip   Station Marker D    Ruler Value 41    ReDS Value Range Moderate volume overload    ReDS Actual Value 37

## 2021-06-06 NOTE — Progress Notes (Signed)
Medication Samples have been provided to the patient.  Drug name: Eliquis       Strength: 5mg         Qty: 4  LOT: [id number]  Exp.Date: 04/2023  Dosing instructions: take 1 tablet po bid  The patient has been instructed regarding the correct time, dose, and frequency of taking this medication, including desired effects and most common side effects.

## 2021-06-08 ENCOUNTER — Other Ambulatory Visit: Payer: Self-pay

## 2021-06-08 ENCOUNTER — Telehealth (HOSPITAL_COMMUNITY): Payer: Self-pay | Admitting: *Deleted

## 2021-06-08 ENCOUNTER — Other Ambulatory Visit (HOSPITAL_COMMUNITY): Payer: Self-pay

## 2021-06-08 MED FILL — Insulin Syringe/Needle U-100 0.3 ML 31 x 5/16": 30 days supply | Qty: 100 | Fill #0 | Status: AC

## 2021-06-08 MED FILL — Glipizide Tab 10 MG: ORAL | 30 days supply | Qty: 60 | Fill #2 | Status: AC

## 2021-06-08 MED FILL — Gabapentin Cap 300 MG: ORAL | 30 days supply | Qty: 180 | Fill #3 | Status: AC

## 2021-06-08 MED FILL — Insulin Glargine Inj 100 Unit/ML: SUBCUTANEOUS | 28 days supply | Qty: 10 | Fill #0 | Status: AC

## 2021-06-08 NOTE — Progress Notes (Signed)
90Paramedicine Encounter    Patient ID: CHRISTOPER BUSHEY, male    DOB: 1961-06-04, 60 y.o.   MRN: [id number]  Arrived for med rec for Mr. Deloatch as he was seen in clinic recently.   I reviewed medications and verified all bottles.   Several medications were missing as they were to be picked up by the patient at [facility] last week and he was unable to do so. I called [place] [facility] and confirmed refills and will pick them up and deliver them to the patient on 7/14.   Pill box filled accordingly minus the missing medications.   [name] currently awaiting Entresto to be sent to his home with update dosing.   I obtained sampled for same in the mean time.   Total copay-   Lantus $49 Syringes $20 Meds $22   [name] elected to only pay for pills at this time as he still has some lantus left.   I will complete pill box once meds are picked up and confirmed.    SOCIAL-  [name] is now getting his $ monthly.   [name] now is on lease of his sisters apartment, she and her husband moved out so it is only [place] living there.   He got the power and water in his name as well. Home is clean and he is taking care of these things on his own. [name] is excited to be able to do this. Encouraged him on his accomplishments.   ACTION: Home visit completed

## 2021-06-08 NOTE — Telephone Encounter (Signed)
Pt is awaiting pt assistance for Entresto, samples provided to community paramedic Jaculin Rasmus to give to pt  Medication Samples have been provided to the patient.  Drug name: Delene Loll       Strength: 24/26mg         Qty: 2  LOT: [id number]  Exp.Date: 7/24  Dosing instructions: Take 1 tab Twice daily   The patient has been instructed regarding the correct time, dose, and frequency of taking this medication, including desired effects and most common side effects.   Betzy Barbier 3:16 PM 06/08/2021

## 2021-06-09 ENCOUNTER — Other Ambulatory Visit: Payer: Self-pay

## 2021-06-09 ENCOUNTER — Other Ambulatory Visit (HOSPITAL_COMMUNITY): Payer: Self-pay

## 2021-06-09 MED FILL — Albuterol Sulfate Inhal Aero 108 MCG/ACT (90MCG Base Equiv): RESPIRATORY_TRACT | 25 days supply | Qty: 18 | Fill #0 | Status: CN

## 2021-06-09 NOTE — Progress Notes (Signed)
Paramedicine Encounter    Patient ID: [name], male    DOB: November 10, 1961, 60 y.o.   MRN: [id number]   Arrived for med rec. Medications missing from yesterdays visit verified and placed into pill box to complete the week. No further required today.   Refills: Potassium  Albuterol    ACTION: Home visit completed

## 2021-06-14 ENCOUNTER — Telehealth (HOSPITAL_COMMUNITY): Payer: Self-pay | Admitting: Pharmacy Technician

## 2021-06-14 NOTE — Telephone Encounter (Signed)
Advanced Heart Failure Patient Advocate Encounter  Sent in [place] and BMS application via fax.  Will follow up.

## 2021-06-15 ENCOUNTER — Other Ambulatory Visit: Payer: Self-pay

## 2021-06-15 ENCOUNTER — Other Ambulatory Visit (HOSPITAL_COMMUNITY): Payer: Self-pay

## 2021-06-15 NOTE — Progress Notes (Signed)
Paramedicine Encounter    Patient ID: [name], male    DOB: [birth year], 60 y.o.   MRN: [id number]   Patient Care Team: Pcp, No as PCP - General [name], MD as PCP - Cardiology (Cardiology) [name], MD as PCP - Advanced Heart Failure (Cardiology) [name], MD as PCP - Electrophysiology (Cardiology) [name], LCSW as Social Worker (Licensed Clinical Social Worker) [name], MD as Consulting Physician (Gastroenterology)  Patient Active Problem List   Diagnosis Date Noted   Adenomatous polyp of ascending colon    GI bleeding 04/13/2021   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   Acute blood loss anemia    Angiodysplasia of stomach    Chronic anticoagulation    CHF exacerbation ([place]) 06/15/2020   AKI (acute kidney injury) ([place]) 06/15/2020   CKD (chronic kidney disease), stage III ([place]) 06/15/2020   Anemia due to chronic blood loss    Gastric AVM    Angiodysplasia of duodenum with hemorrhage    Acute on chronic systolic (congestive) heart failure ([place]) 04/05/2020   Anemia 04/05/2020   PAF (paroxysmal atrial fibrillation) ([place]) 04/05/2020   OSA on CPAP 04/05/2020   Syncope and collapse 04/05/2020   Type 2 diabetes mellitus with hyperglycemia, with long-term current use of insulin ([place]) 12/03/2019   Diabetic polyneuropathy associated with type 2 diabetes mellitus ([place]) 12/03/2019   Hyperglycemia 12/03/2019   History of hyperglycemia 12/03/2019   Erectile dysfunction 12/03/2019   CHF (congestive heart failure) ([place]) 10/11/2019   Paranoid schizophrenia, chronic condition ([place]) 01/26/2015   Severe recurrent major depressive disorder with psychotic features ([place]) 01/26/2015   GAD (generalized anxiety disorder) 01/26/2015   OCD (obsessive compulsive disorder) 01/26/2015   Panic disorder without agoraphobia 01/26/2015   Insomnia 01/26/2015    Current Outpatient Medications:    acetaminophen (TYLENOL) 500 MG  tablet, Take 2 tablets (1,000 mg total) by mouth in the morning and at bedtime., Disp: 180 tablet, Rfl: 3   albuterol (VENTOLIN HFA) 108 (90 Base) MCG/ACT inhaler, INHALE 2 PUFFS INTO THE LUNGS EVERY 6 (SIX) HOURS AS NEEDED FOR WHEEZING OR SHORTNESS OF BREATH., Disp: 18 g, Rfl: 3   amoxicillin-clavulanate (AUGMENTIN) 875-125 MG tablet, Take 1 tablet by mouth every 12 (twelve) hours for 10 days., Disp: 20 tablet, Rfl: 0   apixaban (ELIQUIS) 5 MG TABS tablet, Take 1 tablet (5 mg total) by mouth 2 (two) times daily., Disp: 180 tablet, Rfl: 3   atorvastatin (LIPITOR) 40 MG tablet, Take 1 tablet (40 mg total) by mouth daily at 6 PM., Disp: 90 tablet, Rfl: 3   blood glucose meter kit and supplies KIT, Dispense based on patient and insurance preference. Use up to four times daily as directed. (FOR ICD-9 250.00, 250.01)., Disp: 1 each, Rfl: 0   carvedilol (COREG) 6.25 MG tablet, Take 1 tablet (6.25 mg total) by mouth 2 (two) times daily with a meal., Disp: 60 tablet, Rfl: 3   Continuous Blood Gluc Receiver (FREESTYLE LIBRE 14 DAY READER) DEVI, 1 each by Does not apply route as needed., Disp: 1 each, Rfl: 11   cyclobenzaprine (FLEXERIL) 10 MG tablet, Take 10 mg by mouth 3 (three) times daily as needed for muscle spasms., Disp: , Rfl:    dapagliflozin propanediol (FARXIGA) 10 MG TABS tablet, Take 1 tablet (10 mg total) by mouth daily before breakfast., Disp: 30 tablet, Rfl: 11   ferrous sulfate 325 (65 FE) MG tablet, Take 1 tablet (325 mg total) by mouth every  other day., Disp: 30 tablet, Rfl: 0   furosemide (LASIX) 40 MG tablet, Take 1.5 tablets (60 mg total) by mouth 2 (two) times daily., Disp: 90 tablet, Rfl: 3   gabapentin (NEURONTIN) 300 MG capsule, TAKE 2 CAPSULES (TOTAL = 600 MG), BY MOUTH, 3 TIMES A DAY., Disp: 180 capsule, Rfl: 11   glipiZIDE (GLUCOTROL) 10 MG tablet, TAKE 1 TABLET (10 MG TOTAL) BY MOUTH 2 (TWO) TIMES DAILY BEFORE A MEAL., Disp: 60 tablet, Rfl: 6   insulin glargine (LANTUS) 100 UNIT/ML  injection, INJECT 0.2 MLS (20 UNITS TOTAL) INTO THE SKIN DAILY., Disp: 30 mL, Rfl: 3   Insulin Lispro Prot & Lispro (HUMALOG MIX 75/25 KWIKPEN) (75-25) 100 UNIT/ML Kwikpen, Inject 30 Units into the skin 2 (two) times daily., Disp: 15 mL, Rfl: 11   Insulin Pen Needle 31G X 6 MM MISC, USE AS DIRECTED, Disp: 100 each, Rfl: 0   Insulin Syringe-Needle U-100 31G X 5/16" 0.3 ML MISC, USE 3 TIMES DAILY TO INJECT INSULIN, Disp: 300 each, Rfl: 3   metFORMIN (GLUCOPHAGE) 500 MG tablet, TAKE 1 TABLET (500 MG TOTAL) BY MOUTH 2 (TWO) TIMES DAILY WITH A MEAL., Disp: 60 tablet, Rfl: 6   Multiple Vitamins-Minerals (CENTRUM SILVER 50+MEN) TABS, Take 1 tablet by mouth daily., Disp: , Rfl:    pantoprazole (PROTONIX) 40 MG tablet, Take 1 tablet (40 mg total) by mouth daily., Disp: 30 tablet, Rfl: 0   potassium chloride SA (KLOR-CON) 20 MEQ tablet, Take 2 tablets (40 mEq total) by mouth daily., Disp: 60 tablet, Rfl: 3   QUEtiapine (SEROQUEL) 300 MG tablet, TAKE 1 TABLET BY MOUTH AT BEDTIME., Disp: 90 tablet, Rfl: 0   QUEtiapine (SEROQUEL) 300 MG tablet, take one tab by mouth at bedtime, Disp: 30 tablet, Rfl: 0   sacubitril-valsartan (ENTRESTO) 24-26 MG, Take 1 tablet by mouth 2 (two) times daily., Disp: 180 tablet, Rfl: 3   spironolactone (ALDACTONE) 25 MG tablet, Take 0.5 tablets (12.5 mg total) by mouth at bedtime., Disp: 45 tablet, Rfl: 3   tetrahydrozoline 0.05 % ophthalmic solution, Place 1 drop into both eyes daily., Disp: , Rfl:    TRUEPLUS PEN NEEDLES 31G X 6 MM MISC, USE AS DIRECTED, Disp: 100 each, Rfl: 0 No Known Allergies   Social History   Socioeconomic History   Marital status: Legally Separated    Spouse name: Not on file   Number of children: Not on file   Years of education: Not on file   Highest education level: Not on file  Occupational History   Not on file  Tobacco Use   Smoking status: Former   Smokeless tobacco: Never  Vaping Use   Vaping Use: Never used  Substance and Sexual  Activity   Alcohol use: Not Currently    Alcohol/week: 2.0 standard drinks    Types: 2 Cans of beer per week   Drug use: No   Sexual activity: Yes  Other Topics Concern   Not on file  Social History Narrative   Not on file   Social Determinants of Health   Financial Resource Strain: Not on file  Food Insecurity: No Food Insecurity   Worried About Charity fundraiser in the Last Year: Never true   Ran Out of Food in the Last Year: Never true  Transportation Needs: Unmet Transportation Needs   Lack of Transportation (Medical): Yes   Lack of Transportation (Non-Medical): Yes  Physical Activity: Not on file  Stress: Not on file  Social Connections: Not  on file  Intimate Partner Violence: Not on file    Physical Exam Vitals reviewed.  Constitutional:      Appearance: Normal appearance. He is normal weight.  HENT:     Head: Normocephalic.     Nose: Nose normal.     Mouth/Throat:     Mouth: Mucous membranes are moist.     Pharynx: Oropharynx is clear.  Eyes:     Conjunctiva/sclera: Conjunctivae normal.     Pupils: Pupils are equal, round, and reactive to light.  Cardiovascular:     Rate and Rhythm: Normal rate and regular rhythm.     Pulses: Normal pulses.     Heart sounds: Normal heart sounds.  Pulmonary:     Effort: Pulmonary effort is normal.     Breath sounds: Normal breath sounds.  Abdominal:     General: There is distension.  Musculoskeletal:        General: No swelling. Normal range of motion.     Cervical back: Normal range of motion.     Right lower leg: No edema.     Left lower leg: No edema.  Skin:    General: Skin is warm and dry.     Capillary Refill: Capillary refill takes less than 2 seconds.  Neurological:     General: No focal deficit present.     Mental Status: He is alert. Mental status is at baseline.  Psychiatric:        Mood and Affect: Mood normal.   Arrived for home visit for [facility] who was alert and oriented reporting to be feeling good  just tired today. [name] has been complaint with his medications over the last week. [name] reports eating poorly over the last week including, salty, fried, fast foods. I educated [name] the importance of not eating these types of foods. He verbalized understanding. I gave chris healthier options to choose from, he agreed with trying.   Vitals and assessment as noted. Weight up 6lbs this week. CBG- 308  Medications were reviewed and confirmed. Two pill boxes filled accordingly. [name] was reminded of appointments and he agreed with this. Home visit complete. I will see [name] in two weeks.   Refills: (to be filled on 7/29) -Seroquel -Lasix -Carvedilol      Future Appointments  Date Time Provider [place]  06/16/2021  2:15 PM [facility] LAB [facility] None  06/27/2021 10:40 AM [name], NP [facility] None  07/04/2021  2:30 PM [facility] LAB [facility] None  07/20/2021  2:00 PM [facility] PA/NP [facility] None  09/02/2021  7:40 AM [facility] DEVICE REMOTES [facility]  09/30/2021  1:20 PM [name], NP [place] None  12/02/2021  7:40 AM [facility] DEVICE REMOTES [facility]  03/03/2022  7:40 AM [facility] DEVICE REMOTES [facility]     ACTION: Home visit completed

## 2021-06-16 ENCOUNTER — Other Ambulatory Visit: Payer: Self-pay

## 2021-06-16 ENCOUNTER — Other Ambulatory Visit (HOSPITAL_COMMUNITY): Payer: Medicare (Managed Care)

## 2021-06-20 ENCOUNTER — Other Ambulatory Visit: Payer: Self-pay

## 2021-06-22 ENCOUNTER — Other Ambulatory Visit: Payer: Self-pay

## 2021-06-22 MED ORDER — QUETIAPINE FUMARATE 300 MG PO TABS
ORAL_TABLET | ORAL | 0 refills | Status: DC
  Filled 2021-06-22: qty 45, 30d supply, fill #0

## 2021-06-27 ENCOUNTER — Ambulatory Visit: Payer: Self-pay | Admitting: Nurse Practitioner

## 2021-06-27 ENCOUNTER — Other Ambulatory Visit: Payer: Self-pay

## 2021-06-27 NOTE — Progress Notes (Signed)
Remote ICD transmission.   

## 2021-06-28 ENCOUNTER — Encounter: Payer: Self-pay | Admitting: Family Medicine

## 2021-06-29 ENCOUNTER — Encounter (HOSPITAL_COMMUNITY): Payer: Self-pay | Admitting: Emergency Medicine

## 2021-06-29 ENCOUNTER — Emergency Department (HOSPITAL_COMMUNITY): Payer: Medicare (Managed Care)

## 2021-06-29 ENCOUNTER — Inpatient Hospital Stay (HOSPITAL_COMMUNITY)
Admission: EM | Admit: 2021-06-29 | Discharge: 2021-07-02 | DRG: 378 | Disposition: A | Discharge status: Home / Self Care | Payer: Medicare (Managed Care) | Attending: Internal Medicine | Admitting: Internal Medicine

## 2021-06-29 ENCOUNTER — Other Ambulatory Visit: Payer: Self-pay

## 2021-06-29 DIAGNOSIS — F200 Paranoid schizophrenia: Secondary | ICD-10-CM

## 2021-06-29 DIAGNOSIS — R079 Chest pain, unspecified: Secondary | ICD-10-CM | POA: Diagnosis not present

## 2021-06-29 DIAGNOSIS — G4733 Obstructive sleep apnea (adult) (pediatric): Secondary | ICD-10-CM

## 2021-06-29 DIAGNOSIS — Z6841 Body Mass Index (BMI) 40.0 and over, adult: Secondary | ICD-10-CM

## 2021-06-29 DIAGNOSIS — I248 Other forms of acute ischemic heart disease: Secondary | ICD-10-CM | POA: Diagnosis present

## 2021-06-29 DIAGNOSIS — E1122 Type 2 diabetes mellitus with diabetic chronic kidney disease: Secondary | ICD-10-CM | POA: Diagnosis present

## 2021-06-29 DIAGNOSIS — Z8249 Family history of ischemic heart disease and other diseases of the circulatory system: Secondary | ICD-10-CM

## 2021-06-29 DIAGNOSIS — F411 Generalized anxiety disorder: Secondary | ICD-10-CM | POA: Diagnosis present

## 2021-06-29 DIAGNOSIS — F429 Obsessive-compulsive disorder, unspecified: Secondary | ICD-10-CM | POA: Diagnosis present

## 2021-06-29 DIAGNOSIS — K31811 Angiodysplasia of stomach and duodenum with bleeding: Principal | ICD-10-CM | POA: Diagnosis present

## 2021-06-29 DIAGNOSIS — K2970 Gastritis, unspecified, without bleeding: Secondary | ICD-10-CM | POA: Diagnosis not present

## 2021-06-29 DIAGNOSIS — D649 Anemia, unspecified: Secondary | ICD-10-CM | POA: Diagnosis not present

## 2021-06-29 DIAGNOSIS — I130 Hypertensive heart and chronic kidney disease with heart failure and stage 1 through stage 4 chronic kidney disease, or unspecified chronic kidney disease: Secondary | ICD-10-CM | POA: Diagnosis present

## 2021-06-29 DIAGNOSIS — E785 Hyperlipidemia, unspecified: Secondary | ICD-10-CM | POA: Diagnosis present

## 2021-06-29 DIAGNOSIS — Z20822 Contact with and (suspected) exposure to covid-19: Secondary | ICD-10-CM | POA: Diagnosis present

## 2021-06-29 DIAGNOSIS — I959 Hypotension, unspecified: Secondary | ICD-10-CM | POA: Diagnosis present

## 2021-06-29 DIAGNOSIS — M5442 Lumbago with sciatica, left side: Secondary | ICD-10-CM

## 2021-06-29 DIAGNOSIS — K922 Gastrointestinal hemorrhage, unspecified: Secondary | ICD-10-CM | POA: Diagnosis present

## 2021-06-29 DIAGNOSIS — I5022 Chronic systolic (congestive) heart failure: Secondary | ICD-10-CM | POA: Diagnosis present

## 2021-06-29 DIAGNOSIS — D509 Iron deficiency anemia, unspecified: Secondary | ICD-10-CM | POA: Diagnosis present

## 2021-06-29 DIAGNOSIS — D62 Acute posthemorrhagic anemia: Secondary | ICD-10-CM | POA: Diagnosis present

## 2021-06-29 DIAGNOSIS — Z9989 Dependence on other enabling machines and devices: Secondary | ICD-10-CM

## 2021-06-29 DIAGNOSIS — Z7901 Long term (current) use of anticoagulants: Secondary | ICD-10-CM

## 2021-06-29 DIAGNOSIS — Z95810 Presence of automatic (implantable) cardiac defibrillator: Secondary | ICD-10-CM | POA: Diagnosis not present

## 2021-06-29 DIAGNOSIS — R778 Other specified abnormalities of plasma proteins: Secondary | ICD-10-CM | POA: Diagnosis present

## 2021-06-29 DIAGNOSIS — N179 Acute kidney failure, unspecified: Secondary | ICD-10-CM | POA: Diagnosis not present

## 2021-06-29 DIAGNOSIS — I255 Ischemic cardiomyopathy: Secondary | ICD-10-CM | POA: Diagnosis present

## 2021-06-29 DIAGNOSIS — Z955 Presence of coronary angioplasty implant and graft: Secondary | ICD-10-CM

## 2021-06-29 DIAGNOSIS — I5043 Acute on chronic combined systolic (congestive) and diastolic (congestive) heart failure: Secondary | ICD-10-CM | POA: Diagnosis present

## 2021-06-29 DIAGNOSIS — F333 Major depressive disorder, recurrent, severe with psychotic symptoms: Secondary | ICD-10-CM | POA: Diagnosis present

## 2021-06-29 DIAGNOSIS — F32A Depression, unspecified: Secondary | ICD-10-CM | POA: Diagnosis present

## 2021-06-29 DIAGNOSIS — N529 Male erectile dysfunction, unspecified: Secondary | ICD-10-CM | POA: Diagnosis present

## 2021-06-29 DIAGNOSIS — N1831 Chronic kidney disease, stage 3a: Secondary | ICD-10-CM | POA: Diagnosis not present

## 2021-06-29 DIAGNOSIS — D500 Iron deficiency anemia secondary to blood loss (chronic): Secondary | ICD-10-CM | POA: Diagnosis not present

## 2021-06-29 DIAGNOSIS — I252 Old myocardial infarction: Secondary | ICD-10-CM

## 2021-06-29 DIAGNOSIS — E869 Volume depletion, unspecified: Secondary | ICD-10-CM | POA: Diagnosis present

## 2021-06-29 DIAGNOSIS — Z635 Disruption of family by separation and divorce: Secondary | ICD-10-CM

## 2021-06-29 DIAGNOSIS — I480 Paroxysmal atrial fibrillation: Secondary | ICD-10-CM

## 2021-06-29 DIAGNOSIS — K648 Other hemorrhoids: Secondary | ICD-10-CM | POA: Diagnosis present

## 2021-06-29 DIAGNOSIS — E1165 Type 2 diabetes mellitus with hyperglycemia: Secondary | ICD-10-CM | POA: Diagnosis present

## 2021-06-29 DIAGNOSIS — Z794 Long term (current) use of insulin: Secondary | ICD-10-CM

## 2021-06-29 DIAGNOSIS — K31819 Angiodysplasia of stomach and duodenum without bleeding: Secondary | ICD-10-CM | POA: Diagnosis not present

## 2021-06-29 DIAGNOSIS — I5023 Acute on chronic systolic (congestive) heart failure: Secondary | ICD-10-CM | POA: Diagnosis not present

## 2021-06-29 DIAGNOSIS — K5520 Angiodysplasia of colon without hemorrhage: Secondary | ICD-10-CM

## 2021-06-29 DIAGNOSIS — N183 Chronic kidney disease, stage 3 unspecified: Secondary | ICD-10-CM | POA: Diagnosis present

## 2021-06-29 DIAGNOSIS — K5730 Diverticulosis of large intestine without perforation or abscess without bleeding: Secondary | ICD-10-CM | POA: Diagnosis present

## 2021-06-29 DIAGNOSIS — Z79899 Other long term (current) drug therapy: Secondary | ICD-10-CM

## 2021-06-29 DIAGNOSIS — I5032 Chronic diastolic (congestive) heart failure: Secondary | ICD-10-CM | POA: Diagnosis not present

## 2021-06-29 DIAGNOSIS — K921 Melena: Secondary | ICD-10-CM

## 2021-06-29 DIAGNOSIS — D6832 Hemorrhagic disorder due to extrinsic circulating anticoagulants: Secondary | ICD-10-CM | POA: Diagnosis present

## 2021-06-29 DIAGNOSIS — I2510 Atherosclerotic heart disease of native coronary artery without angina pectoris: Secondary | ICD-10-CM | POA: Diagnosis not present

## 2021-06-29 DIAGNOSIS — N182 Chronic kidney disease, stage 2 (mild): Secondary | ICD-10-CM | POA: Diagnosis present

## 2021-06-29 DIAGNOSIS — I214 Non-ST elevation (NSTEMI) myocardial infarction: Secondary | ICD-10-CM

## 2021-06-29 DIAGNOSIS — I509 Heart failure, unspecified: Secondary | ICD-10-CM

## 2021-06-29 DIAGNOSIS — K635 Polyp of colon: Secondary | ICD-10-CM | POA: Diagnosis present

## 2021-06-29 DIAGNOSIS — G8929 Other chronic pain: Secondary | ICD-10-CM

## 2021-06-29 DIAGNOSIS — Z818 Family history of other mental and behavioral disorders: Secondary | ICD-10-CM

## 2021-06-29 LAB — CBC WITH DIFFERENTIAL/PLATELET
Abs Immature Granulocytes: 0.05 10*3/uL (ref 0.00–0.07)
Basophils Absolute: 0 10*3/uL (ref 0.0–0.1)
Basophils Relative: 1 %
Eosinophils Absolute: 0.2 10*3/uL (ref 0.0–0.5)
Eosinophils Relative: 2 %
HCT: 17.8 % — ABNORMAL LOW (ref 39.0–52.0)
Hemoglobin: 4.7 g/dL — CL (ref 13.0–17.0)
Immature Granulocytes: 1 %
Lymphocytes Relative: 13 %
Lymphs Abs: 1.1 10*3/uL (ref 0.7–4.0)
MCH: 19.9 pg — ABNORMAL LOW (ref 26.0–34.0)
MCHC: 26.4 g/dL — ABNORMAL LOW (ref 30.0–36.0)
MCV: 75.4 fL — ABNORMAL LOW (ref 80.0–100.0)
Monocytes Absolute: 0.6 10*3/uL (ref 0.1–1.0)
Monocytes Relative: 8 %
Neutro Abs: 6.3 10*3/uL (ref 1.7–7.7)
Neutrophils Relative %: 75 %
Platelets: 390 10*3/uL (ref 150–400)
RBC: 2.36 MIL/uL — ABNORMAL LOW (ref 4.22–5.81)
RDW: 18.6 % — ABNORMAL HIGH (ref 11.5–15.5)
WBC: 8.3 10*3/uL (ref 4.0–10.5)
nRBC: 0.5 % — ABNORMAL HIGH (ref 0.0–0.2)

## 2021-06-29 LAB — COMPREHENSIVE METABOLIC PANEL
ALT: 16 U/L (ref 0–44)
AST: 18 U/L (ref 15–41)
Albumin: 2.7 g/dL — ABNORMAL LOW (ref 3.5–5.0)
Alkaline Phosphatase: 56 U/L (ref 38–126)
Anion gap: 8 (ref 5–15)
BUN: 32 mg/dL — ABNORMAL HIGH (ref 8–23)
CO2: 22 mmol/L (ref 22–32)
Calcium: 8.2 mg/dL — ABNORMAL LOW (ref 8.9–10.3)
Chloride: 104 mmol/L (ref 98–111)
Creatinine, Ser: 1.48 mg/dL — ABNORMAL HIGH (ref 0.61–1.24)
GFR, Estimated: 53 mL/min — ABNORMAL LOW (ref >60)
Glucose, Bld: 227 mg/dL — ABNORMAL HIGH (ref 70–99)
Potassium: 4.1 mmol/L (ref 3.5–5.1)
Sodium: 134 mmol/L — ABNORMAL LOW (ref 135–145)
Total Bilirubin: 0.5 mg/dL (ref 0.3–1.2)
Total Protein: 6.3 g/dL — ABNORMAL LOW (ref 6.5–8.1)

## 2021-06-29 LAB — CBC
HCT: 23.8 % — ABNORMAL LOW (ref 39.0–52.0)
Hemoglobin: 6.5 g/dL — CL (ref 13.0–17.0)
MCH: 21.2 pg — ABNORMAL LOW (ref 26.0–34.0)
MCHC: 27.3 g/dL — ABNORMAL LOW (ref 30.0–36.0)
MCV: 77.8 fL — ABNORMAL LOW (ref 80.0–100.0)
Platelets: 412 10*3/uL — ABNORMAL HIGH (ref 150–400)
RBC: 3.06 MIL/uL — ABNORMAL LOW (ref 4.22–5.81)
RDW: 18.4 % — ABNORMAL HIGH (ref 11.5–15.5)
WBC: 8.2 10*3/uL (ref 4.0–10.5)
nRBC: 0.7 % — ABNORMAL HIGH (ref 0.0–0.2)

## 2021-06-29 LAB — CBG MONITORING, ED
Glucose-Capillary: 137 mg/dL — ABNORMAL HIGH (ref 70–99)
Glucose-Capillary: 149 mg/dL — ABNORMAL HIGH (ref 70–99)
Glucose-Capillary: 160 mg/dL — ABNORMAL HIGH (ref 70–99)

## 2021-06-29 LAB — GLUCOSE, CAPILLARY: Glucose-Capillary: 140 mg/dL — ABNORMAL HIGH (ref 70–99)

## 2021-06-29 LAB — BRAIN NATRIURETIC PEPTIDE
B Natriuretic Peptide: 246 pg/mL — ABNORMAL HIGH (ref 0.0–100.0)
B Natriuretic Peptide: 349.4 pg/mL — ABNORMAL HIGH (ref 0.0–100.0)

## 2021-06-29 LAB — I-STAT CHEM 8, ED
BUN: 31 mg/dL — ABNORMAL HIGH (ref 8–23)
Calcium, Ion: 1.15 mmol/L (ref 1.15–1.40)
Chloride: 103 mmol/L (ref 98–111)
Creatinine, Ser: 1.6 mg/dL — ABNORMAL HIGH (ref 0.61–1.24)
Glucose, Bld: 230 mg/dL — ABNORMAL HIGH (ref 70–99)
HCT: 18 % — ABNORMAL LOW (ref 39.0–52.0)
Hemoglobin: 6.1 g/dL — CL (ref 13.0–17.0)
Potassium: 4.2 mmol/L (ref 3.5–5.1)
Sodium: 136 mmol/L (ref 135–145)
TCO2: 26 mmol/L (ref 22–32)

## 2021-06-29 LAB — HEMOGLOBIN A1C
Hgb A1c MFr Bld: 6.5 % — ABNORMAL HIGH (ref 4.8–5.6)
Mean Plasma Glucose: 139.85 mg/dL

## 2021-06-29 LAB — TROPONIN I (HIGH SENSITIVITY)
Troponin I (High Sensitivity): 635 ng/L (ref <18)
Troponin I (High Sensitivity): 604 ng/L (ref <18)
Troponin I (High Sensitivity): 660 ng/L

## 2021-06-29 LAB — RESP PANEL BY RT-PCR (FLU A&B, COVID) ARPGX2
Influenza A by PCR: NEGATIVE
Influenza B by PCR: NEGATIVE
SARS Coronavirus 2 by RT PCR: NEGATIVE

## 2021-06-29 LAB — PREPARE RBC (CROSSMATCH)

## 2021-06-29 LAB — POC OCCULT BLOOD, ED: Fecal Occult Bld: POSITIVE — AB

## 2021-06-29 MED ORDER — FUROSEMIDE 10 MG/ML IJ SOLN
20.0000 mg | Freq: Once | INTRAMUSCULAR | Status: AC
  Administered 2021-06-29: 20 mg via INTRAVENOUS
  Filled 2021-06-29: qty 2

## 2021-06-29 MED ORDER — SODIUM CHLORIDE 0.9 % IV SOLN: 250.0000 mL | INTRAVENOUS | Status: DC | PRN

## 2021-06-29 MED ORDER — SODIUM CHLORIDE 0.9% FLUSH
3.0000 mL | Freq: Two times a day (BID) | INTRAVENOUS | Status: DC
  Administered 2021-06-29 – 2021-07-02 (×6): 3 mL via INTRAVENOUS

## 2021-06-29 MED ORDER — QUETIAPINE FUMARATE 50 MG PO TABS
450.0000 mg | ORAL_TABLET | Freq: Every day | ORAL | Status: DC
  Administered 2021-06-29 – 2021-07-01 (×3): 450 mg via ORAL
  Filled 2021-06-29: qty 1
  Filled 2021-06-29 (×3): qty 4

## 2021-06-29 MED ORDER — BISACODYL 5 MG PO TBEC: 5.0000 mg | DELAYED_RELEASE_TABLET | Freq: Every day | ORAL | Status: DC | PRN

## 2021-06-29 MED ORDER — INSULIN ASPART 100 UNIT/ML IJ SOLN
0.0000 [IU] | Freq: Every day | INTRAMUSCULAR | Status: DC
  Administered 2021-06-30: 2 [IU] via SUBCUTANEOUS

## 2021-06-29 MED ORDER — HYDROMORPHONE HCL 1 MG/ML IJ SOLN: 0.5000 mg | INTRAMUSCULAR | Status: DC | PRN

## 2021-06-29 MED ORDER — LEVALBUTEROL HCL 0.63 MG/3ML IN NEBU
0.6300 mg | INHALATION_SOLUTION | Freq: Four times a day (QID) | RESPIRATORY_TRACT | Status: DC | PRN
Start: 2021-06-29 — End: 2021-07-02
  Filled 2021-06-29: qty 3

## 2021-06-29 MED ORDER — ATORVASTATIN CALCIUM 40 MG PO TABS
40.0000 mg | ORAL_TABLET | Freq: Every day | ORAL | Status: DC
  Administered 2021-06-29 – 2021-07-01 (×3): 40 mg via ORAL
  Filled 2021-06-29 (×3): qty 1

## 2021-06-29 MED ORDER — SODIUM CHLORIDE 0.9% IV SOLUTION: Freq: Once | INTRAVENOUS | Status: DC

## 2021-06-29 MED ORDER — PANTOPRAZOLE INFUSION (NEW) - SIMPLE MED
8.0000 mg/h | INTRAVENOUS | Status: DC
  Administered 2021-06-29 – 2021-07-01 (×5): 8 mg/h via INTRAVENOUS
  Filled 2021-06-29 (×3): qty 80
  Filled 2021-06-29 (×4): qty 100
  Filled 2021-06-29: qty 80

## 2021-06-29 MED ORDER — HYDRALAZINE HCL 20 MG/ML IJ SOLN: 10.0000 mg | INTRAMUSCULAR | Status: DC | PRN

## 2021-06-29 MED ORDER — SODIUM CHLORIDE 0.9 % IV SOLN: 10.0000 mL/h | Freq: Once | INTRAVENOUS | Status: DC

## 2021-06-29 MED ORDER — FERROUS SULFATE 325 (65 FE) MG PO TABS
325.0000 mg | ORAL_TABLET | ORAL | Status: DC
  Administered 2021-06-29 – 2021-07-01 (×2): 325 mg via ORAL
  Filled 2021-06-29 (×3): qty 1

## 2021-06-29 MED ORDER — PANTOPRAZOLE SODIUM 40 MG IV SOLR: 40.0000 mg | Freq: Two times a day (BID) | INTRAVENOUS | Status: DC

## 2021-06-29 MED ORDER — IPRATROPIUM BROMIDE 0.02 % IN SOLN: 0.5000 mg | Freq: Four times a day (QID) | RESPIRATORY_TRACT | Status: DC | PRN

## 2021-06-29 MED ORDER — CARVEDILOL 6.25 MG PO TABS
6.2500 mg | ORAL_TABLET | Freq: Two times a day (BID) | ORAL | Status: DC
  Administered 2021-06-29 – 2021-07-02 (×7): 6.25 mg via ORAL
  Filled 2021-06-29 (×2): qty 2
  Filled 2021-06-29 (×5): qty 1

## 2021-06-29 MED ORDER — PANTOPRAZOLE 80MG IVPB - SIMPLE MED
80.0000 mg | Freq: Once | INTRAVENOUS | Status: AC
  Administered 2021-06-29: 80 mg via INTRAVENOUS
  Filled 2021-06-29: qty 80

## 2021-06-29 MED ORDER — ACETAMINOPHEN 650 MG RE SUPP: 650.0000 mg | Freq: Four times a day (QID) | RECTAL | Status: DC | PRN

## 2021-06-29 MED ORDER — SENNOSIDES-DOCUSATE SODIUM 8.6-50 MG PO TABS: 1.0000 | ORAL_TABLET | Freq: Every evening | ORAL | Status: DC | PRN

## 2021-06-29 MED ORDER — SODIUM CHLORIDE 0.9% FLUSH
3.0000 mL | INTRAVENOUS | Status: DC | PRN
  Administered 2021-07-02: 3 mL via INTRAVENOUS

## 2021-06-29 MED ORDER — NAPHAZOLINE-GLYCERIN 0.012-0.25 % OP SOLN
2.0000 [drp] | Freq: Four times a day (QID) | OPHTHALMIC | Status: DC | PRN
  Filled 2021-06-29: qty 15

## 2021-06-29 MED ORDER — NAPHAZOLINE-GLYCERIN 0.012-0.2 % OP SOLN: 2.0000 [drp] | Freq: Four times a day (QID) | OPHTHALMIC | Status: DC | PRN

## 2021-06-29 MED ORDER — SACUBITRIL-VALSARTAN 24-26 MG PO TABS
1.0000 | ORAL_TABLET | Freq: Two times a day (BID) | ORAL | Status: DC
  Administered 2021-06-29 – 2021-07-01 (×5): 1 via ORAL
  Filled 2021-06-29 (×6): qty 1

## 2021-06-29 MED ORDER — INSULIN ASPART 100 UNIT/ML IJ SOLN
0.0000 [IU] | Freq: Three times a day (TID) | INTRAMUSCULAR | Status: DC
  Administered 2021-06-29: 2 [IU] via SUBCUTANEOUS
  Administered 2021-06-29 (×2): 1 [IU] via SUBCUTANEOUS
  Administered 2021-06-30: 2 [IU] via SUBCUTANEOUS
  Administered 2021-06-30: 3 [IU] via SUBCUTANEOUS
  Administered 2021-06-30: 2 [IU] via SUBCUTANEOUS
  Administered 2021-07-01: 3 [IU] via SUBCUTANEOUS
  Administered 2021-07-01 – 2021-07-02 (×2): 1 [IU] via SUBCUTANEOUS

## 2021-06-29 MED ORDER — FUROSEMIDE 10 MG/ML IJ SOLN
20.0000 mg | Freq: Once | INTRAMUSCULAR | Status: DC
  Filled 2021-06-29: qty 2

## 2021-06-29 MED ORDER — GABAPENTIN 300 MG PO CAPS
600.0000 mg | ORAL_CAPSULE | Freq: Three times a day (TID) | ORAL | Status: DC
  Administered 2021-06-29 – 2021-07-02 (×10): 600 mg via ORAL
  Filled 2021-06-29 (×10): qty 2

## 2021-06-29 MED ORDER — ONDANSETRON HCL 4 MG/2ML IJ SOLN
4.0000 mg | Freq: Four times a day (QID) | INTRAMUSCULAR | Status: DC | PRN
  Administered 2021-06-29: 4 mg via INTRAVENOUS
  Filled 2021-06-29: qty 2

## 2021-06-29 MED ORDER — ADULT MULTIVITAMIN W/MINERALS CH
1.0000 | ORAL_TABLET | Freq: Every day | ORAL | Status: DC
  Administered 2021-06-29 – 2021-07-02 (×4): 1 via ORAL
  Filled 2021-06-29 (×4): qty 1

## 2021-06-29 MED ORDER — FUROSEMIDE 20 MG PO TABS: 60.0000 mg | ORAL_TABLET | Freq: Two times a day (BID) | ORAL | Status: DC

## 2021-06-29 MED ORDER — ACETAMINOPHEN 325 MG PO TABS
650.0000 mg | ORAL_TABLET | Freq: Once | ORAL | Status: AC
  Administered 2021-06-29: 650 mg via ORAL
  Filled 2021-06-29: qty 2

## 2021-06-29 MED ORDER — SODIUM CHLORIDE 0.9 % IV SOLN: INTRAVENOUS | Status: AC

## 2021-06-29 MED ORDER — OXYCODONE HCL 5 MG PO TABS: 5.0000 mg | ORAL_TABLET | ORAL | Status: DC | PRN

## 2021-06-29 MED ORDER — DIPHENHYDRAMINE HCL 25 MG PO CAPS
25.0000 mg | ORAL_CAPSULE | Freq: Once | ORAL | Status: AC
  Administered 2021-06-29: 25 mg via ORAL
  Filled 2021-06-29: qty 1

## 2021-06-29 MED ORDER — ACETAMINOPHEN 325 MG PO TABS: 650.0000 mg | ORAL_TABLET | Freq: Four times a day (QID) | ORAL | Status: DC | PRN

## 2021-06-29 MED ORDER — SODIUM CHLORIDE 0.9 % IV SOLN
1.0000 g | INTRAVENOUS | Status: DC
  Administered 2021-06-29 – 2021-07-02 (×4): 1 g via INTRAVENOUS
  Filled 2021-06-29 (×4): qty 10

## 2021-06-29 MED ORDER — INSULIN GLARGINE-YFGN 100 UNIT/ML ~~LOC~~ SOLN
20.0000 [IU] | Freq: Every day | SUBCUTANEOUS | Status: DC
  Administered 2021-06-29 – 2021-07-02 (×4): 20 [IU] via SUBCUTANEOUS
  Filled 2021-06-29 (×5): qty 0.2

## 2021-06-29 MED ORDER — SODIUM CHLORIDE 0.9 % IV SOLN
500.0000 mg | Freq: Once | INTRAVENOUS | Status: AC
  Administered 2021-06-29: 500 mg via INTRAVENOUS
  Filled 2021-06-29: qty 500

## 2021-06-29 MED ORDER — ONDANSETRON HCL 4 MG PO TABS: 4.0000 mg | ORAL_TABLET | Freq: Four times a day (QID) | ORAL | Status: DC | PRN

## 2021-06-29 NOTE — ED Notes (Signed)
Repositioned. Eating lunch meal. Alert, NAD, calm. VSS.

## 2021-06-29 NOTE — ED Notes (Signed)
Blood continues to infuse without complication, no signs of distress.

## 2021-06-29 NOTE — Consult Note (Addendum)
Advanced Heart Failure Team Consult Note   Primary Physician: Pcp, No PCP-Cardiologist:  [name], MD/Dr. Aundra Dubin in HF clinic  Reason for Consultation: chest pain/elevated troponin  HPI:    [name] is seen today for evaluation of chest pain and elevated troponin at the request of Dr. Roger Shelter with Hospitalist team. This is a 60 year old male with history of chronic systolic CHF, hx ICD placement, CAD, type 2 diabetes, paroxysmal atrial fibrillation, HTN, HLD, recurrent GI bleeds, iron deficiency anemia and schizophrenia. [name] had PCI to [facility] in 2012. In November 2020 admitted with CHF. EF 25-30% on echo.  LHC demonstrated occluded OM2 stent, 90% D1, diffused RCA disease treated medically. [name] does not smoke, rarely drinks etoh, and does not use drugs.  [name] mother had "heart problems."    [name]'s had multiple GI bleeds. 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 [name] was hospitalized in 7/21 with upper GI bleeding and CHF exacerbation.  [name] required transfusion and IV iron. EGD showed duodenal AVMs, treated with APC. Most recent hospitalized in May 2022 with a/c CHF. Hospitalization complicated by GIB & AKI. Had enteroscopy and APC eradication of 2 nonbleeding AVMs.  Colonscopy done with evidence of diverticulosis, nonbleeding internal hemorrhoids andresection of 12 colon polyps. Eliquis held during admission but restarted at discharge.  Last seen for follow-up on 07/11 and appeared compensated.  [name] was started on spironolactone. Did not need any adjustments to loop diuretic.   Patient noticed stools appeared more dark about a week ago. Reports developing. substernal chest tightness and dyspnea when walking downtown on 08/01. Symptoms resolved after about 30 minutes. Pain recurred yesterday evening while lying in bed. No associated N, V or diaphoresis. No recent fever, chills, diarrhea or other illness. Took 3 Excedrin at home with no relief.  EMS was called. Given 2 SL  nitroglycerin en route to hospital with prompt relief of pain. Hemodynamically stable on arrival.  Hemoglobin 4.7, MCV 75. Transfused with 2 units pRBCs and started on protonix gtt,  receiving 2 more units today. GI consulted. Creatinine 1.48 and up to 1.60 (baseline .98-1.2).  HS troponin 635 > 604 > 660.  BNP 246 (110 in June).  Chest x-ray with mild left suprahilar atx or infiltrate. ECG with sinus rhythm, rate 85 bpm, inferolateral ST depression.   More comfortable at time of evaluation. Reports less dyspnea and chest pain resolved.  Has been compliant with medications.   Consumed 1 beer a few days ago. Does not consume alcohol routinely.   Review of Systems: [y] = yes, '[ ]'  = no   General: Weight gain '[ ]' ; Weight loss '[ ]' ; Anorexia '[ ]' ; Fatigue '[ ]' ; Fever '[ ]' ; Chills '[ ]' ; Weakness '[ ]'   Cardiac: Chest pain/pressure [y]; Resting SOB [y]; Exertional SOB [y]; Orthopnea '[ ]' ; Pedal Edema '[ ]' ; Palpitations '[ ]' ; Syncope '[ ]' ; Presyncope '[ ]' ; Paroxysmal nocturnal dyspnea'[ ]'   Pulmonary: Cough '[ ]' ; Wheezing'[ ]' ; Hemoptysis'[ ]' ; Sputum '[ ]' ; Snoring '[ ]'   GI: Vomiting'[ ]' ; Dysphagia'[ ]' ; Melena[Y]; Hematochezia '[ ]' ; Heartburn'[ ]' ; Abdominal pain '[ ]' ; Constipation '[ ]' ; Diarrhea '[ ]' ; BRBPR '[ ]'   GU: Hematuria'[ ]' ; Dysuria '[ ]' ; Nocturia'[ ]'   Vascular: Pain in legs with walking '[ ]' ; Pain in feet with lying flat '[ ]' ; Non-healing sores '[ ]' ; Stroke '[ ]' ; TIA '[ ]' ; Slurred speech '[ ]' ;  Neuro: Headaches'[ ]' ; Vertigo'[ ]' ; Seizures'[ ]' ; Paresthesias'[ ]' ;Blurred vision '[ ]' ; Diplopia '[ ]' ;  Vision changes '[ ]'   Ortho/Skin: Arthritis '[ ]' ; Joint pain '[ ]' ; Muscle pain '[ ]' ; Joint swelling '[ ]' ; Back Pain '[ ]' ; Rash '[ ]'   Psych: Depression'[ ]' ; Anxiety'[ ]'   Heme: Bleeding problems [Y]; Clotting disorders '[ ]' ; Anemia [y]  Endocrine: Diabetes [y]; Thyroid dysfunction'[ ]'   Home Medications Prior to Admission medications   Medication Sig Start Date End Date Taking? Authorizing Provider  acetaminophen (TYLENOL) 500 MG tablet Take 2 tablets (1,000  mg total) by mouth in the morning and at bedtime. 04/19/21  Yes Doda, Edd Arbour, MD  albuterol (VENTOLIN HFA) 108 (90 Base) MCG/ACT inhaler INHALE 2 PUFFS INTO THE LUNGS EVERY 6 (SIX) HOURS AS NEEDED FOR WHEEZING OR SHORTNESS OF BREATH. 01/19/21 01/19/22 Yes Azzie Glatter, FNP  apixaban (ELIQUIS) 5 MG TABS tablet Take 1 tablet (5 mg total) by mouth 2 (two) times daily. 03/02/21 06/06/22 Yes Simmons, Brittainy M, PA-C  atorvastatin (LIPITOR) 40 MG tablet Take 1 tablet (40 mg total) by mouth daily at 6 PM. 04/21/21 07/25/21 Yes [name], MD  blood glucose meter kit and supplies KIT Dispense based on patient and insurance preference. Use up to four times daily as directed. (FOR ICD-9 250.00, 250.01). 10/16/19  Yes Barb Merino, MD  carvedilol (COREG) 6.25 MG tablet Take 1 tablet (6.25 mg total) by mouth 2 (two) times daily with a meal. 05/11/21  Yes Milford, Maricela Bo, FNP  Continuous Blood Gluc Receiver (FREESTYLE LIBRE 14 DAY READER) DEVI 1 each by Does not apply route as needed. 10/26/20  Yes Azzie Glatter, FNP  cyclobenzaprine (FLEXERIL) 10 MG tablet Take 10 mg by mouth 3 (three) times daily as needed for muscle spasms.   Yes [provider]  dapagliflozin propanediol (FARXIGA) 10 MG TABS tablet Take 1 tablet (10 mg total) by mouth daily before breakfast. 03/29/21  Yes [name], MD  ferrous sulfate 325 (65 FE) MG tablet Take 1 tablet (325 mg total) by mouth every other day. 04/19/21  Yes Armando Reichert, MD  furosemide (LASIX) 40 MG tablet Take 1.5 tablets (60 mg total) by mouth 2 (two) times daily. 05/11/21  Yes Milford, Maricela Bo, FNP  gabapentin (NEURONTIN) 300 MG capsule TAKE 2 CAPSULES (TOTAL = 600 MG), BY MOUTH, 3 TIMES A DAY. 09/13/20 09/13/21 Yes Azzie Glatter, FNP  glipiZIDE (GLUCOTROL) 10 MG tablet TAKE 1 TABLET (10 MG TOTAL) BY MOUTH 2 (TWO) TIMES DAILY BEFORE A MEAL. 10/05/20 10/05/21 Yes Azzie Glatter, FNP  insulin glargine (LANTUS) 100 UNIT/ML injection INJECT 0.2  MLS (20 UNITS TOTAL) INTO THE SKIN DAILY. Patient taking differently: Inject 20 Units into the skin daily. 10/06/20 10/06/21 Yes Azzie Glatter, FNP  Insulin Lispro Prot & Lispro (HUMALOG MIX 75/25 KWIKPEN) (75-25) 100 UNIT/ML Kwikpen Inject 30 Units into the skin 2 (two) times daily. 02/18/20  Yes Azzie Glatter, FNP  Insulin Pen Needle 31G X 6 MM MISC USE AS DIRECTED 11/04/20 11/04/21 Yes Azzie Glatter, FNP  Insulin Syringe-Needle U-100 31G X 5/16" 0.3 ML MISC USE 3 TIMES DAILY TO INJECT INSULIN 10/26/20 10/26/21 Yes Azzie Glatter, FNP  metFORMIN (GLUCOPHAGE) 500 MG tablet TAKE 1 TABLET (500 MG TOTAL) BY MOUTH 2 (TWO) TIMES DAILY WITH A MEAL. 05/27/21 05/27/22 Yes King, Diona Foley, NP  Multiple Vitamins-Minerals (CENTRUM SILVER 50+MEN) TABS Take 1 tablet by mouth daily.   Yes [provider]  pantoprazole (PROTONIX) 40 MG tablet Take 1 tablet (40 mg total) by mouth daily. 05/31/21 07/09/21 Yes  [name], NP  potassium chloride SA (KLOR-CON) 20 MEQ tablet Take 2 tablets (40 mEq total) by mouth daily. 05/11/21  Yes Milford, Maricela Bo, FNP  QUEtiapine (SEROQUEL) 300 MG tablet Take 1 & 1/2 tablet by mouth daily. Patient taking differently: Take 450 mg by mouth daily. 06/22/21  Yes   sacubitril-valsartan (ENTRESTO) 24-26 MG Take 1 tablet by mouth 2 (two) times daily. 05/11/21  Yes Milford, Maricela Bo, FNP  spironolactone (ALDACTONE) 25 MG tablet Take 0.5 tablets (12.5 mg total) by mouth at bedtime. 06/06/21  Yes Milford, [place], FNP  tetrahydrozoline 0.05 % ophthalmic solution Place 1 drop into both eyes daily.   Yes [provider]  TRUEPLUS PEN NEEDLES 31G X 6 MM MISC USE AS DIRECTED 11/04/20  Yes Azzie Glatter, FNP  QUEtiapine (SEROQUEL) 300 MG tablet TAKE 1 TABLET BY MOUTH AT BEDTIME. Patient not taking: Reported on 06/29/2021 02/16/21 02/16/22    QUEtiapine (SEROQUEL) 300 MG tablet take one tab by mouth at bedtime Patient not taking: No sig reported 05/25/21       Past  Medical History: Past Medical History:  Diagnosis Date   Anxiety    CAD (coronary artery disease)    CHF (congestive heart failure) ([place]) 09/2019   Depression    Diabetes mellitus    Erectile dysfunction 11/2019   H/O right heart catheterization 09/2019   Hypertension    Schizophrenia ([place])    Sleep apnea    uses cpap    Past Surgical History: Past Surgical History:  Procedure Laterality Date   BIOPSY  04/19/2021   Procedure: BIOPSY;  Surgeon: Doran Stabler, MD;  Location: [place];  Service: Gastroenterology;;   COLONOSCOPY WITH PROPOFOL N/A 04/16/2021   Procedure: COLONOSCOPY WITH PROPOFOL;  Surgeon: [name], MD;  Location: [place];  Service: Gastroenterology;  Laterality: N/A;   CORONARY STENT PLACEMENT     ENTEROSCOPY N/A 04/08/2020   Procedure: ENTEROSCOPY;  Surgeon: Doran Stabler, MD;  Location: [place];  Service: Gastroenterology;  Laterality: N/A;   ENTEROSCOPY N/A 06/17/2020   Procedure: ENTEROSCOPY;  Surgeon: Irene Shipper, MD;  Location: [facility] ENDOSCOPY;  Service: Endoscopy;  Laterality: N/A;   ENTEROSCOPY N/A 04/15/2021   Procedure: ENTEROSCOPY;  Surgeon: [name], MD;  Location: [place];  Service: Gastroenterology;  Laterality: N/A;   ENTEROSCOPY N/A 04/19/2021   Procedure: ENTEROSCOPY;  Surgeon: Doran Stabler, MD;  Location: [place];  Service: Gastroenterology;  Laterality: N/A;   GIVENS CAPSULE STUDY  04/16/2021   Procedure: GIVENS CAPSULE STUDY;  Surgeon: [name], MD;  Location: [place];  Service: Gastroenterology;;   HEMOSTASIS CLIP PLACEMENT  04/08/2020   Procedure: HEMOSTASIS CLIP PLACEMENT;  Surgeon: Doran Stabler, MD;  Location: [place];  Service: Gastroenterology;;   HEMOSTASIS CONTROL  04/08/2020   Procedure: HEMOSTASIS CONTROL;  Surgeon: Doran Stabler, MD;  Location: [facility] ENDOSCOPY;  Service: Gastroenterology;;   HEMOSTASIS CONTROL  06/17/2020   Procedure: HEMOSTASIS CONTROL;   Surgeon: Irene Shipper, MD;  Location: [place];  Service: Endoscopy;;   HERNIA REPAIR     HOT HEMOSTASIS N/A 04/15/2021   Procedure: HOT HEMOSTASIS (ARGON PLASMA COAGULATION/BICAP);  Surgeon: [name], MD;  Location: [place];  Service: Gastroenterology;  Laterality: N/A;   ICD IMPLANT N/A 09/03/2020   Procedure: ICD IMPLANT;  Surgeon: [name], MD;  Location: [place] CV LAB;  Service: Cardiovascular;  Laterality: N/A;   POLYPECTOMY  04/16/2021   Procedure:  POLYPECTOMY;  Surgeon: [name], MD;  Location: [place];  Service: Gastroenterology;;   RIGHT/LEFT HEART CATH AND CORONARY ANGIOGRAPHY N/A 10/14/2019   Procedure: RIGHT/LEFT HEART CATH AND CORONARY ANGIOGRAPHY;  Surgeon: Belva Crome, MD;  Location: [place] [facility];  Service: Cardiovascular;  Laterality: N/A;   SUBMUCOSAL TATTOO INJECTION  04/19/2021   Procedure: SUBMUCOSAL TATTOO INJECTION;  Surgeon: Doran Stabler, MD;  Location: [facility] ENDOSCOPY;  Service: Gastroenterology;;    Family History: Family History  Problem Relation Age of Onset   Heart failure Mother    Mental illness Sister    Mental illness Sister     Social History: Social History   Socioeconomic History   Marital status: Legally Separated    Spouse name: Not on file   Number of children: Not on file   Years of education: Not on file   Highest education level: Not on file  Occupational History   Not on file  Tobacco Use   Smoking status: Former   Smokeless tobacco: Never  Vaping Use   Vaping Use: Never used  Substance and Sexual Activity   Alcohol use: Not Currently    Alcohol/week: 2.0 standard drinks    Types: 2 Cans of beer per week   Drug use: No   Sexual activity: Yes  Other Topics Concern   Not on file  Social History Narrative   Not on file   Social Determinants of Health   Financial Resource Strain: Not on file  Food Insecurity: No Food Insecurity   Worried About Charity fundraiser in the  Last Year: Never true   Ran Out of Food in the Last Year: Never true  Transportation Needs: Unmet Transportation Needs   Lack of Transportation (Medical): Yes   Lack of Transportation (Non-Medical): Yes  Physical Activity: Not on file  Stress: Not on file  Social Connections: Not on file    Allergies:  No Known Allergies  Objective:    Vital Signs:   Temp:  [97.9 F (36.6 C)-98.7 F (37.1 C)] 97.9 F (36.6 C) (08/03 0914) Pulse Rate:  [65-97] 78 (08/03 1100) Resp:  [11-22] 21 (08/03 1100) BP: (79-139)/(46-104) 97/57 (08/03 1100) SpO2:  [93 %-100 %] 97 % (08/03 1100)    Weight change: There were no vitals filed for this visit.  Intake/Output:   Intake/Output Summary (Last 24 hours) at 06/29/2021 1131 Last data filed at 06/29/2021 0944 Gross per 24 hour  Intake 1200 ml  Output 1200 ml  Net 0 ml      Physical Exam    General:  Well appearing. No resp difficulty HEENT: normal Neck: supple. No jvd . Carotids 2+ bilat; no bruits. No lymphadenopathy or thyromegaly appreciated. Cor: PMI nondisplaced. Regular rate & rhythm. No rubs, gallops or murmurs. Lungs: diminished Abdomen: soft, nontender, nondistended. No hepatosplenomegaly. No bruits or masses. Good bowel sounds. Extremities: no cyanosis, clubbing, rash, edema Neuro: alert & orientedx3, cranial nerves grossly intact. moves all 4 extremities w/o difficulty. Affect pleasant   Telemetry   Sinus rhythm, 70s. Occasional PVCs.   EKG    NSR, 85 bpm. 1 mm ST depression in inferolateral leads  Labs   Basic Metabolic Panel: Recent Labs  Lab 06/29/21 0240 06/29/21 0250  NA 134* 136  K 4.1 4.2  CL 104 103  CO2 22  --   GLUCOSE 227* 230*  BUN 32* 31*  CREATININE 1.48* 1.60*  CALCIUM 8.2*  --     Liver Function Tests: Recent  Labs  Lab 06/29/21 0240  AST 18  ALT 16  ALKPHOS 56  BILITOT 0.5  PROT 6.3*  ALBUMIN 2.7*   No results for input(s): LIPASE, AMYLASE in the last 168 hours. No results for  input(s): AMMONIA in the last 168 hours.  CBC: Recent Labs  Lab 06/29/21 0240 06/29/21 0250 06/29/21 1000  WBC 8.3  --  8.2  NEUTROABS 6.3  --   --   HGB 4.7* 6.1* 6.5*  HCT 17.8* 18.0* 23.8*  MCV 75.4*  --  77.8*  PLT 390  --  412*    Cardiac Enzymes: No results for input(s): CKTOTAL, CKMB, CKMBINDEX, TROPONINI in the last 168 hours.  BNP: BNP (last 3 results) Recent Labs    04/13/21 1307 05/11/21 1614 06/29/21 0240  BNP 404.4* 110.8* 246.0*    ProBNP (last 3 results) Recent Labs    02/23/21 1315  PROBNP 249*     CBG: Recent Labs  Lab 06/29/21 0936  GLUCAP 137*    Coagulation Studies: No results for input(s): LABPROT, INR in the last 72 hours.   Imaging   DG Chest Portable 1 View  Result Date: 06/29/2021 CLINICAL DATA:  Substernal chest pain. EXAM: PORTABLE CHEST 1 VIEW COMPARISON:  Apr 13, 2021 FINDINGS: A stable single lead ventricular pacer is noted. Mild atelectasis and/or infiltrate is seen within the suprahilar region on the left. There is no evidence of a pleural effusion or pneumothorax. The cardiac silhouette is mildly enlarged. The visualized skeletal structures are unremarkable. IMPRESSION: Stable cardiomegaly with mild left suprahilar atelectasis and/or infiltrate. Electronically Signed   By: Virgina Norfolk M.D.   On: 06/29/2021 02:57     Medications:     Current Medications:  sodium chloride   Intravenous Once   atorvastatin  40 mg Oral q1800   carvedilol  6.25 mg Oral BID WC   ferrous sulfate  325 mg Oral QODAY   furosemide  20 mg Intravenous Once   furosemide  20 mg Intravenous Once   [START ON 06/30/2021] furosemide  60 mg Oral BID   gabapentin  600 mg Oral TID   insulin aspart  0-5 Units Subcutaneous QHS   insulin aspart  0-9 Units Subcutaneous TID WC   insulin glargine-yfgn  20 Units Subcutaneous Daily   multivitamin with minerals  1 tablet Oral Daily   [START ON 07/02/2021] pantoprazole  40 mg Intravenous Q12H   QUEtiapine  450  mg Oral Daily   sacubitril-valsartan  1 tablet Oral BID   sodium chloride flush  3 mL Intravenous Q12H   sodium chloride flush  3 mL Intravenous Q12H    Infusions:  sodium chloride     sodium chloride 100 mL/hr at 06/29/21 0917   sodium chloride     cefTRIAXone (ROCEPHIN)  IV Stopped (06/29/21 0455)   pantoprazole 8 mg/hr (06/29/21 0452)     Assessment/Plan  Chest pain/elevated troponin: -CP suspicious for angina in setting of severe anemia. Pain resolved with SL nitroglycerin. Feeling better after transfusion. -HS troponin [id number].  -Troponin elevation likely secondary to demand ischemia from severe anemia.  -Does have known CAD. Last cath 11/20220 with occluded OM2 stent, 90% small OM1, diffuse disease RCA treated medically.  ST depression in inferolateral leads in territory of known disease. -No ischemic workup with active bleeding. Consider LHC if recurrent chest pain after treating anemia. -On statin. No aspirin d/t GI bleeding.  2. CAD:  -Last cath 1/20 with occluded OM2 stent, 90% D1, severe diffuse disease in  the RCA (no intervention).  -See above -No ASA d/t bleeding  -Continue atorvastatin.  3. Acute GI bleed: -history of recurrent GI bleed d/t gastric and duodenal AVMs, most recently May 2022 -Hemoglobin down to 4.7, 6.5 after 2 units pRBCs -Transfusing another 2 units today -On protonix gtt -GI consulted  4. Paroxysmal atrial fibrillation: -Maintaining sinus rhythm -CHADS2-VASc score = 4 (CAD, CHF, HTN, DM). Has been on Eliquis. -D/t recurrent GI bleeding, consider referral to discuss left atrial appendage occlusion with watchman device after discharge.  5. Chronic systolic CHF:  -Echo in 17/61 with EF 25-30%, echo 5/21 with EF 30% with mildly decreased RV systolic function. Biotronik ICD.  Suspect mixed ischemic/nonischemic cardiomyopathy.   -Will repeat echo once transfused -No recorded weight. Denies weight gain at home. NYHA class IV symptoms but likely  d/t anemia. Does not appear significantly overloaded. BNP slightly her than in June.  -Agree with 20 mg IV lasix after each transfusion. Hold po lasix while getting IV diuretic. -Continue Entresto 24/26/mg bid. -Continue Coreg 6.25 mg bid.   -Monitor BP closely. May need to hold Coreg.  -Hold of on spiro for now d/t hypotension and AKI on CKD -Add back farxiga tomorrow if renal function improving  6. HTN: -Hypotensive in ED -BP improving. Continue to monitor  7. OSA:  -CPAP machine dated and does not work. Updated sleep study ordered at last HF visit  8. AKI on CKD: -Cr 1.60 (baseline 0.9-1.2) -Likely d/t volume depletion from severe anemia  Length of Stay: 0  FINCH, LINDSAY N, PA-C  06/29/2021, 11:31 AM  Advanced Heart Failure Team Pager [phone] (M-F; 7a - 5p)  Please contact [facility] for night-coverage after hours (4p -7a ) and weekends on [email or web address]   Patient seen with PA, agree with the above note.   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 [name] has had recurrent GI bleeds.  Last echo in 5/22 with EF 30%, mildly decreased RV systolic function. This admission, [name] reports about 1 week of black stools.  Yesterday, [name] developed severe chest tightness so went to the ER.  Hgb was noted to be 4.7.  [name] has received 2 units PBCs so far.  Hs-TnI was in the 600s range with no trend.  No further chest pain after transfusions.  ECG showed slight inferolateral ST depression.  [name] is in NSR.   General: NAD Neck: JVP 8 cm, no thyromegaly or thyroid nodule.  Lungs: Clear to auscultation bilaterally with normal respiratory effort. CV: Nondisplaced PMI.  Heart regular S1/S2, no S3/S4, no murmur.  Trace ankle edema.  No carotid bruit.  Normal pedal pulses.  Abdomen: Soft, nontender, no hepatosplenomegaly, no distention.  Skin: Intact without lesions or rashes.  Neurologic: Alert and oriented x 3.  Psych: Normal affect. Extremities: No clubbing or cyanosis.   HEENT: Normal.   Recurrent melena from probable upper GI bleeding. Has history of AVMs, most recent prior episode in 5/22.  [name] has been on Eliquis with PAF.  - Hold Eliquis for now.  - Protonix.  - Transfuse to keep hgb > 8 with active bleeding.  - GI evaluation, will need scope.  - Will workup for Watchman to allow [place] to be able to stop Eliquis safely.    [name] is not markedly volume overloaded.  Creatinine up to 1.6 with GI bleeding.  - [name] is getting IV Lasix with blood.  - Will hold Farxiga and spironolactone for now, will restart when creatinine stabilizes.  - Continue current  Entresto and Coreg.   Loralie Champagne 06/29/2021 2:30 PM

## 2021-06-29 NOTE — H&P (Signed)
History and Physical   Patient: [name]                            PCP: Pcp, No                    DOB: 03/09/1961            DOA: 06/29/2021 MRN:[id number]             DOS: 06/29/2021, 9:13 AM  Patient coming from:   HOME  I have personally reviewed patient's medical records, in electronic medical records, including:  [facility], and care everywhere.    Chief Complaint:   Chief Complaint  Patient presents with   Chest Pain    EMS reports pt to have CP onset 2 days ago, intermittent in nature but increasing in severity tonight.  Pt took excedrin x3 PTA at home Nitro x2 per EMS en route with relief of sternal CP.  Py has h/o AMI with stents and ICD    History of present illness:    [name] is a 60 y.o. male with medical history significant of A. Fib on Eliquis, Mis, ICD, CAD with stents, HTN, HLD, schizophrenia, depression, anxiety, diastolic CHF, chronic anemia, gastric AVM, OSA, DM2... Presented with chief complaint of chest pain.  Reported pressure type of pain intermittent for past 2 days severity increased tonight.  Not associated with diaphoresis or radiation of the pain.  Did report took 2 nitroglycerin. Also reported he has been noticing significant changes color of his stool more blackish in color and noticed some blood.  But did not have having any frank rectal bleeding    Patient Denies having: Fever, Chills, Cough, SOB, Abd pain, N/V/D, headache, dizziness, lightheadedness,  Dysuria, Joint pain, rash, open wounds  ED Course: Blood pressure 121/79, pulse 71, temperature 97.9 F (36.6 C), temperature source Oral, resp. rate 18, SpO2 100 %.  Abnormal labs; hemoglobin 4.7, hematocrit 18, MCV 75, Hemoccult positive x2, troponin 635, 604, BNP 246, Creatinine 1.48, 1.60, BUN 31,  Patient will be admitted with symptomatic anemia, GI bleed, elevated troponin ischemic demand versus non-STEMI due to severe anemia  Cardiology and gastroenterologist was  called by the ED staff overnight  Review of Systems: As per HPI, otherwise 10 point review of systems were negative.   ----------------------------------------------------------------------------------------------------------------------  No Known Allergies  Home MEDs:  Prior to Admission medications   Medication Sig Start Date End Date Taking? Authorizing Provider  acetaminophen (TYLENOL) 500 MG tablet Take 2 tablets (1,000 mg total) by mouth in the morning and at bedtime. 04/19/21  Yes Doda, Vandana, MD  albuterol (VENTOLIN HFA) 108 (90 Base) MCG/ACT inhaler INHALE 2 PUFFS INTO THE LUNGS EVERY 6 (SIX) HOURS AS NEEDED FOR WHEEZING OR SHORTNESS OF BREATH. 01/19/21 01/19/22 Yes Stroud, Natalie M, FNP  apixaban (ELIQUIS) 5 MG TABS tablet Take 1 tablet (5 mg total) by mouth 2 (two) times daily. 03/02/21 06/06/22 Yes Simmons, Brittainy M, PA-C  atorvastatin (LIPITOR) 40 MG tablet Take 1 tablet (40 mg total) by mouth daily at 6 PM. 04/21/21 07/25/21 Yes McLean, [name] S, MD  blood glucose meter kit and supplies KIT Dispense based on patient and insurance preference. Use up to four times daily as directed. (FOR ICD-9 250.00, 250.01). 10/16/19  Yes Ghimire, Kuber, MD  carvedilol (COREG) 6.25 MG tablet Take 1 tablet (6.25 mg total) by mouth 2 (two) times daily with a meal.   05/11/21  Yes Milford, Jessica M, FNP  Continuous Blood Gluc Receiver (FREESTYLE LIBRE 14 DAY READER) DEVI 1 each by Does not apply route as needed. 10/26/20  Yes Stroud, Natalie M, FNP  cyclobenzaprine (FLEXERIL) 10 MG tablet Take 10 mg by mouth 3 (three) times daily as needed for muscle spasms.   Yes [provider]  dapagliflozin propanediol (FARXIGA) 10 MG TABS tablet Take 1 tablet (10 mg total) by mouth daily before breakfast. 03/29/21  Yes McLean, [name] S, MD  ferrous sulfate 325 (65 FE) MG tablet Take 1 tablet (325 mg total) by mouth every other day. 04/19/21  Yes Doda, Vandana, MD  furosemide (LASIX) 40 MG tablet Take 1.5  tablets (60 mg total) by mouth 2 (two) times daily. 05/11/21  Yes Milford, Jessica M, FNP  gabapentin (NEURONTIN) 300 MG capsule TAKE 2 CAPSULES (TOTAL = 600 MG), BY MOUTH, 3 TIMES A DAY. 09/13/20 09/13/21 Yes Stroud, Natalie M, FNP  glipiZIDE (GLUCOTROL) 10 MG tablet TAKE 1 TABLET (10 MG TOTAL) BY MOUTH 2 (TWO) TIMES DAILY BEFORE A MEAL. 10/05/20 10/05/21 Yes Stroud, Natalie M, FNP  insulin glargine (LANTUS) 100 UNIT/ML injection INJECT 0.2 MLS (20 UNITS TOTAL) INTO THE SKIN DAILY. Patient taking differently: Inject 20 Units into the skin daily. 10/06/20 10/06/21 Yes Stroud, Natalie M, FNP  Insulin Lispro Prot & Lispro (HUMALOG MIX 75/25 KWIKPEN) (75-25) 100 UNIT/ML Kwikpen Inject 30 Units into the skin 2 (two) times daily. 02/18/20  Yes Stroud, Natalie M, FNP  Insulin Pen Needle 31G X 6 MM MISC USE AS DIRECTED 11/04/20 11/04/21 Yes Stroud, Natalie M, FNP  Insulin Syringe-Needle U-100 31G X 5/16" 0.3 ML MISC USE 3 TIMES DAILY TO INJECT INSULIN 10/26/20 10/26/21 Yes Stroud, Natalie M, FNP  metFORMIN (GLUCOPHAGE) 500 MG tablet TAKE 1 TABLET (500 MG TOTAL) BY MOUTH 2 (TWO) TIMES DAILY WITH A MEAL. 05/27/21 05/27/22 Yes King, [name] M, NP  Multiple Vitamins-Minerals (CENTRUM SILVER 50+MEN) TABS Take 1 tablet by mouth daily.   Yes [provider]  pantoprazole (PROTONIX) 40 MG tablet Take 1 tablet (40 mg total) by mouth daily. 05/31/21 07/09/21 Yes King, [name] M, NP  potassium chloride SA (KLOR-CON) 20 MEQ tablet Take 2 tablets (40 mEq total) by mouth daily. 05/11/21  Yes Milford, Jessica M, FNP  QUEtiapine (SEROQUEL) 300 MG tablet Take 1 & 1/2 tablet by mouth daily. Patient taking differently: Take 450 mg by mouth daily. 06/22/21  Yes   sacubitril-valsartan (ENTRESTO) 24-26 MG Take 1 tablet by mouth 2 (two) times daily. 05/11/21  Yes Milford, Jessica M, FNP  spironolactone (ALDACTONE) 25 MG tablet Take 0.5 tablets (12.5 mg total) by mouth at bedtime. 06/06/21  Yes Milford, Jessica M, FNP  tetrahydrozoline  0.05 % ophthalmic solution Place 1 drop into both eyes daily.   Yes [provider]  TRUEPLUS PEN NEEDLES 31G X 6 MM MISC USE AS DIRECTED 11/04/20  Yes Stroud, Natalie M, FNP  QUEtiapine (SEROQUEL) 300 MG tablet TAKE 1 TABLET BY MOUTH AT BEDTIME. Patient not taking: Reported on 06/29/2021 02/16/21 02/16/22    QUEtiapine (SEROQUEL) 300 MG tablet take one tab by mouth at bedtime Patient not taking: No sig reported 05/25/21       PRN MEDs: sodium chloride, acetaminophen **OR** acetaminophen, bisacodyl, hydrALAZINE, HYDROmorphone (DILAUDID) injection, ipratropium, levalbuterol, naphazoline-glycerin, ondansetron **OR** ondansetron (ZOFRAN) IV, oxyCODONE, senna-docusate, sodium chloride flush  Past Medical History:  Diagnosis Date   Anxiety    CAD (coronary artery disease)    CHF (congestive heart failure) (  [place]) 09/2019   Depression    Diabetes mellitus    Erectile dysfunction 11/2019   H/O right heart catheterization 09/2019   Hypertension    Schizophrenia ([place])    Sleep apnea    uses cpap    Past Surgical History:  Procedure Laterality Date   BIOPSY  04/19/2021   Procedure: BIOPSY;  Surgeon: Doran Stabler, MD;  Location: [facility] ENDOSCOPY;  Service: Gastroenterology;;   COLONOSCOPY WITH PROPOFOL N/A 04/16/2021   Procedure: COLONOSCOPY WITH PROPOFOL;  Surgeon: [name], MD;  Location: [place];  Service: Gastroenterology;  Laterality: N/A;   CORONARY STENT PLACEMENT     ENTEROSCOPY N/A 04/08/2020   Procedure: ENTEROSCOPY;  Surgeon: Doran Stabler, MD;  Location: [place];  Service: Gastroenterology;  Laterality: N/A;   ENTEROSCOPY N/A 06/17/2020   Procedure: ENTEROSCOPY;  Surgeon: Irene Shipper, MD;  Location: [facility] ENDOSCOPY;  Service: Endoscopy;  Laterality: N/A;   ENTEROSCOPY N/A 04/15/2021   Procedure: ENTEROSCOPY;  Surgeon: [name], MD;  Location: [place];  Service: Gastroenterology;  Laterality: N/A;   ENTEROSCOPY N/A 04/19/2021   Procedure:  ENTEROSCOPY;  Surgeon: Doran Stabler, MD;  Location: [place];  Service: Gastroenterology;  Laterality: N/A;   GIVENS CAPSULE STUDY  04/16/2021   Procedure: GIVENS CAPSULE STUDY;  Surgeon: [name], MD;  Location: [place];  Service: Gastroenterology;;   HEMOSTASIS CLIP PLACEMENT  04/08/2020   Procedure: HEMOSTASIS CLIP PLACEMENT;  Surgeon: Doran Stabler, MD;  Location: [place];  Service: Gastroenterology;;   HEMOSTASIS CONTROL  04/08/2020   Procedure: HEMOSTASIS CONTROL;  Surgeon: Doran Stabler, MD;  Location[facility] ENDOSCOPY;  Service: Gastroenterology;;   HEMOSTASIS CONTROL  06/17/2020   Procedure: HEMOSTASIS CONTROL;  Surgeon: Irene Shipper, MD;  Location: [place];  Service: Endoscopy;;   HERNIA REPAIR     HOT HEMOSTASIS N/A 04/15/2021   Procedure: HOT HEMOSTASIS (ARGON PLASMA COAGULATION/BICAP);  Surgeon: [name], MD;  Location: [place];  Service: Gastroenterology;  Laterality: N/A;   ICD IMPLANT N/A 09/03/2020   Procedure: ICD IMPLANT;  Surgeon: [name], MD;  Location: [place] CV LAB;  Service: Cardiovascular;  Laterality: N/A;   POLYPECTOMY  04/16/2021   Procedure: POLYPECTOMY;  Surgeon: [name], MD;  Location: [facility] ENDOSCOPY;  Service: Gastroenterology;;   RIGHT/LEFT HEART CATH AND CORONARY ANGIOGRAPHY N/A 10/14/2019   Procedure: RIGHT/LEFT HEART CATH AND CORONARY ANGIOGRAPHY;  Surgeon: Belva Crome, MD;  Location: [place] CV LAB;  Service: Cardiovascular;  Laterality: N/A;   SUBMUCOSAL TATTOO INJECTION  04/19/2021   Procedure: SUBMUCOSAL TATTOO INJECTION;  Surgeon: Doran Stabler, MD;  Location: [place];  Service: Gastroenterology;;     reports that he has quit smoking. He has never used smokeless tobacco. He reports previous alcohol use of about 2.0 standard drinks of alcohol per week. He reports that he does not use drugs.   Family History  Problem Relation Age of Onset   Heart failure Mother     Mental illness Sister    Mental illness Sister     Physical Exam:   Vitals:   06/29/21 0700 06/29/21 0703 06/29/21 0715 06/29/21 0730  BP:  129/77 111/78 (!) 139/104  Pulse: 70 73 74 71  Resp: _0 Temp:  98 F (36.7 C)    TempSrc:      SpO2: 100% 99% 100% 100%   Constitutional: NAD, calm, comfortable Eyes: PERRL, lids and conjunctivae normal ENMT: Mucous  membranes are moist. Posterior pharynx clear of any exudate or lesions.Normal dentition.  Neck: normal, supple, no masses, no thyromegaly Respiratory: clear to auscultation bilaterally, no wheezing, no crackles. Normal respiratory effort. No accessory muscle use.  Cardiovascular: Regular rate and rhythm, no murmurs / rubs / gallops. No extremity edema. 2+ pedal pulses. No carotid bruits.  Abdomen: no tenderness, no masses palpated. No hepatosplenomegaly. Bowel sounds positive.  Musculoskeletal: no clubbing / cyanosis. No joint deformity upper and lower extremities. Good ROM, no contractures. Normal muscle tone.  Neurologic: CN II-XII grossly intact. Sensation intact, DTR normal. Strength 5/5 in all 4.  Psychiatric: Normal judgment and insight. Alert and oriented x 3. Normal mood.  Skin: no rashes, lesions, ulcers. No induration Wounds: per nursing documentation         Labs on admission:    I have personally reviewed following labs and imaging studies  CBC: Recent Labs  Lab 06/29/21 0240 06/29/21 0250  WBC 8.3  --   NEUTROABS 6.3  --   HGB 4.7* 6.1*  HCT 17.8* 18.0*  MCV 75.4*  --   PLT 390  --    Basic Metabolic Panel: Recent Labs  Lab 06/29/21 0240 06/29/21 0250  NA 134* 136  K 4.1 4.2  CL 104 103  CO2 22  --   GLUCOSE 227* 230*  BUN 32* 31*  CREATININE 1.48* 1.60*  CALCIUM 8.2*  --    GFR: Estimated Creatinine Clearance: 74.7 mL/min (A) (by C-G formula based on SCr of 1.6 mg/dL (H)). Liver Function Tests: Recent Labs  Lab 06/29/21 0240  AST 18  ALT 16  ALKPHOS 56  BILITOT 0.5   PROT 6.3*  ALBUMIN 2.7*   No results for input(s): LIPASE, AMYLASE in the last 168 hours. No results for input(s): AMMONIA in the last 168 hours. Coagulation Profile: No results for input(s): INR, PROTIME in the last 168 hours. Cardiac Enzymes: No results for input(s): CKTOTAL, CKMB, CKMBINDEX, TROPONINI in the last 168 hours. BNP (last 3 results) Recent Labs    02/23/21 1315  PROBNP 249*   HbA1C: No results for input(s): HGBA1C in the last 72 hours. CBG: No results for input(s): GLUCAP in the last 168 hours. Lipid Profile: No results for input(s): CHOL, HDL, LDLCALC, TRIG, CHOLHDL, LDLDIRECT in the last 72 hours. Thyroid Function Tests: No results for input(s): TSH, T4TOTAL, FREET4, T3FREE, THYROIDAB in the last 72 hours. Anemia Panel: No results for input(s): VITAMINB12, FOLATE, FERRITIN, TIBC, IRON, RETICCTPCT in the last 72 hours. Urine analysis:    Component Value Date/Time   COLORURINE STRAW (A) 01/26/2021 1132   APPEARANCEUR CLEAR 01/26/2021 1132   LABSPEC 1.006 01/26/2021 1132   PHURINE 5.0 01/26/2021 1132   GLUCOSEU NEGATIVE 01/26/2021 1132   HGBUR SMALL (A) 01/26/2021 1132   BILIRUBINUR neg 04/01/2021 1403   KETONESUR NEGATIVE 01/26/2021 1132   PROTEINUR Negative 04/01/2021 1403   PROTEINUR 30 (A) 01/26/2021 1132   UROBILINOGEN 0.2 04/01/2021 1403   UROBILINOGEN 1.0 06/10/2010 2207   NITRITE neg 04/01/2021 1403   NITRITE NEGATIVE 01/26/2021 1132   LEUKOCYTESUR Negative 04/01/2021 1403   LEUKOCYTESUR NEGATIVE 01/26/2021 1132     Radiologic Exams on Admission:   DG Chest Portable 1 View  Result Date: 06/29/2021 CLINICAL DATA:  Substernal chest pain. EXAM: PORTABLE CHEST 1 VIEW COMPARISON:  Apr 13, 2021 FINDINGS: A stable single lead ventricular pacer is noted. Mild atelectasis and/or infiltrate is seen within the suprahilar region on the left. There is no evidence of a  pleural effusion or pneumothorax. The cardiac silhouette is mildly enlarged. The  visualized skeletal structures are unremarkable. IMPRESSION: Stable cardiomegaly with mild left suprahilar atelectasis and/or infiltrate. Electronically Signed   By: Virgina Norfolk M.D.   On: 06/29/2021 02:57    EKG:   Independently reviewed.  Orders placed or performed during the hospital encounter of 06/29/21   ED EKG   ED EKG   EKG 12-Lead   EKG 12-Lead   EKG 12-Lead   ---------------------------------------------------------------------------------------------------------------------------------------    Assessment / Plan:   Principal Problem:   GI bleed Active Problems:   Severe anemia   Paranoid schizophrenia, chronic condition (HCC)   Severe recurrent major depressive disorder with psychotic features (HCC)   GAD (generalized anxiety disorder)   OCD (obsessive compulsive disorder)   CHF (congestive heart failure) ([place])   Type 2 diabetes mellitus with hyperglycemia, with long-term current use of insulin (HCC)   Acute on chronic systolic (congestive) heart failure (HCC)   PAF (paroxysmal atrial fibrillation) (HCC)   OSA on CPAP   Anemia due to chronic blood loss   CKD (chronic kidney disease), stage III (HCC)   Acute blood loss anemia   Chronic anticoagulation   ICD (implantable cardioverter-defibrillator) in place   Principal Problem:   Acute GI bleed -resulting to severe symptomatic anemia, acute blood loss anemia -Baseline hemoglobin 10.3, today 4.7 -Hemoccult positive x2 -2 units of PRBC initiated in ED, hemoglobin improved to 6.1, 2 more units of PRBC ordered for transfusion (Pros and cons of blood transfusion discussed with the patient detail sheet he has expressed understanding and agreement to pursue) -Due to history of CHF, 20 mg of Lasix will be given between blood transfusions-we will monitor for volume overload -Patient has had a previous GI bleed due to gastric AVMs -On Protonix drip -Gastroenterologist has been consulted by ED staff, appreciate  follow-up and input -Meanwhile we will keep the patient n.p.o.  Elevated troponin -Ruling out non-STEMI, versus ischemic demand due to severe anemia -Recycling recycle troponin, -Patient did have chest pain on arrival, subsequently has resolved Once again could be explained by severe anemia -We will continue monitor closely on monitor bed, as needed aspirin, nitroglycerin, analgesics  Active Problems:  Paranoid schizophrenia, chronic condition ([place]) -currently stable, reviewing and resuming home medications accordingly   severe recurrent major depressive disorder with psychotic features ([place])  -continue home medication including high-dose Seroquel   GAD (generalized anxiety disorder) -stable, continue anxiolytics     OCD (obsessive compulsive disorder) -stable resuming home meds    Chronic dCHF (congestive heart failure) (HCC)  -No signs of exacerbation, monitoring for volume overload with PRBC transfusion -we will pursue with IV Lasix today, resuming oral Lasix in a.m. -Holding home medication of spironolactone, continue Entresto -We will monitor daily weight, I's and O's -Last echocardiogram reviewed 04/15/2020, ejection fraction 30%, with grade 2 diastolic dysfunction    Type 2 diabetes mellitus with hyperglycemia, with long-term current use of insulin (HCC) -Currently n.p.o., checking blood sugar every 4 hours to be changed to q. ACH S, with SSI coverage -Withholding home medications of insulin lispro 75/25, Glucotrol, continue home medication of Lantus at 20 units nightly     PAF (paroxysmal atrial fibrillation) ([place]) -continue home medication of Coreg, with holding Eliquis   OSA on CPAP -currently stable, supplemental O2    Anemia due to chronic blood loss -with iron deficiency anemia, resuming iron supplements, transfusing PRBC as above    CKD (chronic kidney disease), stage III ([place]) -mildly  elevated creatinine from baseline , creatinine this morning 1.60, BUN 31  --likely exacerbated by severe anemia -we will monitor closely, avoiding nephrotoxins     Chronic anticoagulation -on Eliquis due to A. fib on hold now    ICD (implantable cardioverter-defibrillator) in place -stable in place    Cultures:  -none  Antimicrobial: -none   Consults called: Cardiology, gastroenterologist -------------------------------------------------------------------------------------------------------------------------------------------- DVT prophylaxis: SCDs,(home medication of Eliquis on hold Code Status:   Code Status: Full Code   Admission status: Patient will be admitted as Inpatient, with a greater than 2 midnight length of stay. Level of care: Telemetry Cardiac   Family Communication:  none at bedside  (The above findings and plan of care has been discussed with patient in detail, the patient expressed understanding and agreement of above plan)  --------------------------------------------------------------------------------------------------------------------------------------------------  Disposition Plan: >3 days Status is: Inpatient  Remains inpatient appropriate because:Hemodynamically unstable and Inpatient level of care appropriate due to severity of illness  Dispo: The patient is from: Home              Anticipated d/c is to: Home  Patient currently is not medically stable to d/c.  Requiring blood transfusion, evaluation by cardiology and gastroenterologist   Difficult to place patient No   ----------------------------------------------------------------------------------------------------------------------------------------------------  Time spent: > than  64  Min.   SIGNED: Deatra James, MD, FHM. [facility],  Pager (Please use [email or web address] to page to text)  If 7PM-7AM, please contact night-coverage [email or web address],  06/29/2021, 9:13 AM

## 2021-06-29 NOTE — ED Notes (Addendum)
Blood continues to infuse without complication, no signs of distress. Pt glucose checked, requested dinner. Trays pending. Drinks given. Urinal emptied.

## 2021-06-29 NOTE — ED Notes (Addendum)
Pt sleeping/ resting, alert, NAD, calm, interactive, resps e/u, PRBCs and protonix infusing. VSS. Denies pain, sob, nausea.

## 2021-06-29 NOTE — ED Notes (Signed)
Report received, pt resting/ sleeping, NAD, calm.

## 2021-06-29 NOTE — ED Notes (Signed)
Pt laying on side using urinal at this time

## 2021-06-29 NOTE — ED Provider Notes (Addendum)
[name]   CSN: [id number] Arrival date & time: 06/29/21  0224     History Chief Complaint  Patient presents with   Chest Pain    EMS reports pt to have CP onset 2 days ago, intermittent in nature but increasing in severity tonight.  Pt took excedrin x3 PTA at home Nitro x2 per EMS en route with relief of sternal CP.  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 male.  The history is provided by the patient.  Chest Pain Pain location:  Substernal area Pain quality: sharp   Pain radiates to:  Does not radiate Pain severity:  Moderate Onset quality:  Gradual Duration:  2 days Timing:  Intermittent Progression:  Unchanged Chronicity:  Recurrent Context: at rest   Relieved by:  Nothing Worsened by:  Nothing Ineffective treatments: excedrin x3. Associated symptoms: diaphoresis and shortness of breath   Associated symptoms: no back pain, no cough, no dizziness, no fever, no headache, no lower extremity edema, no nausea, no palpitations, no vomiting and no weakness   Risk factors: coronary artery disease and male sex   Patient with CAD and DM presents with episodic chest pain for 2 days lasting 30-60 minutes.  No radiation.  He initially denied rectal bleeding but now stated stool is black for the past week or so.  No f/c/r.     Past Medical History:  Diagnosis Date   Anxiety    CAD (coronary artery disease)    CHF (congestive heart failure) ([place]) 09/2019   Depression    Diabetes mellitus    Erectile dysfunction 11/2019   H/O right heart catheterization 09/2019   Hypertension    Schizophrenia ([place])    Sleep apnea    uses cpap    Patient Active Problem List   Diagnosis Date Noted   Adenomatous polyp of ascending colon    GI bleeding 04/13/2021   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   Acute blood loss anemia    Angiodysplasia of stomach    Chronic  anticoagulation    CHF exacerbation ([place]) 06/15/2020   AKI (acute kidney injury) ([place]) 06/15/2020   CKD (chronic kidney disease), stage III ([place]) 06/15/2020   Anemia due to chronic blood loss    Gastric AVM    Angiodysplasia of duodenum with hemorrhage    Acute on chronic systolic (congestive) heart failure ([place]) 04/05/2020   Anemia 04/05/2020   PAF (paroxysmal atrial fibrillation) ([place]) 04/05/2020   OSA on CPAP 04/05/2020   Syncope and collapse 04/05/2020   Type 2 diabetes mellitus with hyperglycemia, with long-term current use of insulin ([place]) 12/03/2019   Diabetic polyneuropathy associated with type 2 diabetes mellitus ([place]) 12/03/2019   Hyperglycemia 12/03/2019   History of hyperglycemia 12/03/2019   Erectile dysfunction 12/03/2019   CHF (congestive heart failure) ([place]) 10/11/2019   Paranoid schizophrenia, chronic condition ([place]) 01/26/2015   Severe recurrent major depressive disorder with psychotic features ([place]) 01/26/2015   GAD (generalized anxiety disorder) 01/26/2015   OCD (obsessive compulsive disorder) 01/26/2015   Panic disorder without agoraphobia 01/26/2015   Insomnia 01/26/2015    Past Surgical History:  Procedure Laterality Date   BIOPSY  04/19/2021   Procedure: BIOPSY;  Surgeon: Doran Stabler, MD;  Location: [place];  Service: Gastroenterology;;   COLONOSCOPY WITH PROPOFOL N/A 04/16/2021   Procedure: COLONOSCOPY WITH PROPOFOL;  Surgeon: [name], MD;  Location: [place];  Service: Gastroenterology;  Laterality: N/A;   CORONARY STENT PLACEMENT     ENTEROSCOPY N/A 04/08/2020   Procedure: ENTEROSCOPY;  Surgeon: Doran Stabler, MD;  Location: [place];  Service: Gastroenterology;  Laterality: N/A;   ENTEROSCOPY N/A 06/17/2020   Procedure: ENTEROSCOPY;  Surgeon: Irene Shipper, MD;  Location: [facility] ENDOSCOPY;  Service: Endoscopy;  Laterality: N/A;   ENTEROSCOPY N/A 04/15/2021   Procedure: ENTEROSCOPY;  Surgeon: [name], MD;  Location:  [place];  Service: Gastroenterology;  Laterality: N/A;   ENTEROSCOPY N/A 04/19/2021   Procedure: ENTEROSCOPY;  Surgeon: Doran Stabler, MD;  Location: [place];  Service: Gastroenterology;  Laterality: N/A;   GIVENS CAPSULE STUDY  04/16/2021   Procedure: GIVENS CAPSULE STUDY;  Surgeon: [name], MD;  Location: [place];  Service: Gastroenterology;;   HEMOSTASIS CLIP PLACEMENT  04/08/2020   Procedure: HEMOSTASIS CLIP PLACEMENT;  Surgeon: Doran Stabler, MD;  Location: [place];  Service: Gastroenterology;;   HEMOSTASIS CONTROL  04/08/2020   Procedure: HEMOSTASIS CONTROL;  Surgeon: Doran Stabler, MD;  Location: [facility] ENDOSCOPY;  Service: Gastroenterology;;   HEMOSTASIS CONTROL  06/17/2020   Procedure: HEMOSTASIS CONTROL;  Surgeon: Irene Shipper, MD;  Location: [place];  Service: Endoscopy;;   HERNIA REPAIR     HOT HEMOSTASIS N/A 04/15/2021   Procedure: HOT HEMOSTASIS (ARGON PLASMA COAGULATION/BICAP);  Surgeon: [name], MD;  Location: [place];  Service: Gastroenterology;  Laterality: N/A;   ICD IMPLANT N/A 09/03/2020   Procedure: ICD IMPLANT;  Surgeon: [name], MD;  Location: [place] CV LAB;  Service: Cardiovascular;  Laterality: N/A;   POLYPECTOMY  04/16/2021   Procedure: POLYPECTOMY;  Surgeon: [name], MD;  Location[facility] ENDOSCOPY;  Service: Gastroenterology;;   RIGHT/LEFT HEART CATH AND CORONARY ANGIOGRAPHY N/A 10/14/2019   Procedure: RIGHT/LEFT HEART CATH AND CORONARY ANGIOGRAPHY;  Surgeon: Belva Crome, MD;  Location: [place] [facility];  Service: Cardiovascular;  Laterality: N/A;   SUBMUCOSAL TATTOO INJECTION  04/19/2021   Procedure: SUBMUCOSAL TATTOO INJECTION;  Surgeon: Doran Stabler, MD;  Location: [facility] ENDOSCOPY;  Service: Gastroenterology;;       Family History  Problem Relation Age of Onset   Heart failure Mother    Mental illness Sister    Mental illness Sister     Social History   Tobacco Use    Smoking status: Former   Smokeless tobacco: Never  Scientific laboratory technician Use: Never used  Substance Use Topics   Alcohol use: Not Currently    Alcohol/week: 2.0 standard drinks    Types: 2 Cans of beer per week   Drug use: No    Home Medications Prior to Admission medications   Medication Sig Start Date End Date Taking? Authorizing Provider  acetaminophen (TYLENOL) 500 MG tablet Take 2 tablets (1,000 mg total) by mouth in the morning and at bedtime. 04/19/21   Armando Reichert, MD  albuterol (VENTOLIN HFA) 108 (90 Base) MCG/ACT inhaler INHALE 2 PUFFS INTO THE LUNGS EVERY 6 (SIX) HOURS AS NEEDED FOR WHEEZING OR SHORTNESS OF BREATH. 01/19/21 01/19/22  Azzie Glatter, FNP  apixaban (ELIQUIS) 5 MG TABS tablet Take 1 tablet (5 mg total) by mouth 2 (two) times daily. 03/02/21 06/06/22  Lyda Jester M, PA-C  atorvastatin (LIPITOR) 40 MG tablet Take 1 tablet (40 mg total) by mouth daily at 6 PM. 04/21/21 07/25/21  [name], MD  blood glucose meter kit and supplies KIT Dispense based on patient  and insurance preference. Use up to four times daily as directed. (FOR ICD-9 250.00, 250.01). 10/16/19   Barb Merino, MD  carvedilol (COREG) 6.25 MG tablet Take 1 tablet (6.25 mg total) by mouth 2 (two) times daily with a meal. 05/11/21   [place], Maricela Bo, FNP  Continuous Blood Gluc Receiver (FREESTYLE LIBRE 14 DAY READER) DEVI 1 each by Does not apply route as needed. 10/26/20   Azzie Glatter, FNP  cyclobenzaprine (FLEXERIL) 10 MG tablet Take 10 mg by mouth 3 (three) times daily as needed for muscle spasms.    [provider]  dapagliflozin propanediol (FARXIGA) 10 MG TABS tablet Take 1 tablet (10 mg total) by mouth daily before breakfast. 03/29/21   [name], MD  ferrous sulfate 325 (65 FE) MG tablet Take 1 tablet (325 mg total) by mouth every other day. 04/19/21   Armando Reichert, MD  furosemide (LASIX) 40 MG tablet Take 1.5 tablets (60 mg total) by mouth 2 (two) times daily. 05/11/21    [place], Maricela Bo, FNP  gabapentin (NEURONTIN) 300 MG capsule TAKE 2 CAPSULES (TOTAL = 600 MG), BY MOUTH, 3 TIMES A DAY. 09/13/20 09/13/21  Azzie Glatter, FNP  glipiZIDE (GLUCOTROL) 10 MG tablet TAKE 1 TABLET (10 MG TOTAL) BY MOUTH 2 (TWO) TIMES DAILY BEFORE A MEAL. 10/05/20 10/05/21  Azzie Glatter, FNP  insulin glargine (LANTUS) 100 UNIT/ML injection INJECT 0.2 MLS (20 UNITS TOTAL) INTO THE SKIN DAILY. 10/06/20 10/06/21  Azzie Glatter, FNP  Insulin Lispro Prot & Lispro (HUMALOG MIX 75/25 KWIKPEN) (75-25) 100 UNIT/ML Kwikpen Inject 30 Units into the skin 2 (two) times daily. 02/18/20   Azzie Glatter, FNP  Insulin Pen Needle 31G X 6 MM MISC USE AS DIRECTED 11/04/20 11/04/21  Azzie Glatter, FNP  Insulin Syringe-Needle U-100 31G X 5/16" 0.3 ML MISC USE 3 TIMES DAILY TO INJECT INSULIN 10/26/20 10/26/21  Azzie Glatter, FNP  metFORMIN (GLUCOPHAGE) 500 MG tablet TAKE 1 TABLET (500 MG TOTAL) BY MOUTH 2 (TWO) TIMES DAILY WITH A MEAL. 05/27/21 05/27/22  [name], NP  Multiple Vitamins-Minerals (CENTRUM SILVER 50+MEN) TABS Take 1 tablet by mouth daily.    [provider]  pantoprazole (PROTONIX) 40 MG tablet Take 1 tablet (40 mg total) by mouth daily. 05/31/21 07/09/21  [name], NP  potassium chloride SA (KLOR-CON) 20 MEQ tablet Take 2 tablets (40 mEq total) by mouth daily. 05/11/21   Rafael Bihari, FNP  QUEtiapine (SEROQUEL) 300 MG tablet TAKE 1 TABLET BY MOUTH AT BEDTIME. 02/16/21 02/16/22    QUEtiapine (SEROQUEL) 300 MG tablet take one tab by mouth at bedtime 05/25/21     QUEtiapine (SEROQUEL) 300 MG tablet Take 1 & 1/2 tablet by mouth daily. 06/22/21     sacubitril-valsartan (ENTRESTO) 24-26 MG Take 1 tablet by mouth 2 (two) times daily. 05/11/21   Rafael Bihari, FNP  spironolactone (ALDACTONE) 25 MG tablet Take 0.5 tablets (12.5 mg total) by mouth at bedtime. 06/06/21   Rafael Bihari, FNP  tetrahydrozoline 0.05 % ophthalmic solution Place 1 drop into both eyes  daily.    [provider]  TRUEPLUS PEN NEEDLES 31G X 6 MM MISC USE AS DIRECTED 11/04/20   Azzie Glatter, FNP    Allergies    Patient has no known allergies.  Review of Systems   Review of Systems  Constitutional:  Positive for diaphoresis. Negative for fever.  HENT:  Negative for facial swelling.   Eyes:  Negative for redness.  Respiratory:  Positive for shortness of breath. Negative for cough.   Cardiovascular:  Positive for chest pain. Negative for palpitations.  Gastrointestinal:  Positive for anal bleeding. Negative for diarrhea, nausea and vomiting.  Genitourinary:  Negative for difficulty urinating.  Musculoskeletal:  Negative for back pain.  Skin:  Negative for rash.  Neurological:  Negative for dizziness, weakness and headaches.  Psychiatric/Behavioral:  Negative for agitation.   All other systems reviewed and are negative.  Physical Exam Updated Vital Signs BP 112/62   Pulse 86   Temp 98.7 F (37.1 C) (Oral)   Resp 16   SpO2 95%   Physical Exam Vitals and nursing Long reviewed. Exam conducted with a chaperone present.  Constitutional:      General: He is not in acute distress.    Appearance: Normal appearance. He is not diaphoretic.  HENT:     Head: Normocephalic and atraumatic.     Nose: Nose normal.  Eyes:     Conjunctiva/sclera: Conjunctivae normal.     Pupils: Pupils are equal, round, and reactive to light.  Cardiovascular:     Rate and Rhythm: Normal rate and regular rhythm.     Pulses: Normal pulses.     Heart sounds: Normal heart sounds.  Pulmonary:     Effort: Pulmonary effort is normal.     Breath sounds: Normal breath sounds.  Abdominal:     General: Abdomen is flat. Bowel sounds are normal.     Palpations: Abdomen is soft.     Tenderness: There is no abdominal tenderness. There is no guarding.  Genitourinary:    Rectum: Guaiac result positive.  Musculoskeletal:        General: Normal range of motion.     Cervical back: Normal  range of motion and neck supple.  Skin:    General: Skin is warm and dry.     Capillary Refill: Capillary refill takes less than 2 seconds.  Neurological:     General: No focal deficit present.     Mental Status: He is alert and oriented to person, place, and time.     Deep Tendon Reflexes: Reflexes normal.  Psychiatric:        Mood and Affect: Mood normal.        Behavior: Behavior normal.    ED Results / Procedures / Treatments   Labs (all labs ordered are listed, but only abnormal results are displayed) Results for orders placed or performed during the hospital encounter of 06/29/21  Resp Panel by RT-PCR (Flu A&B, Covid) Nasopharyngeal Swab   Specimen: Nasopharyngeal Swab; Nasopharyngeal(NP) swabs in vial transport medium  Result Value Ref Range   SARS Coronavirus 2 by RT PCR NEGATIVE NEGATIVE   Influenza A by PCR NEGATIVE NEGATIVE   Influenza B by PCR NEGATIVE NEGATIVE  CBC with Differential/Platelet  Result Value Ref Range   WBC 8.3 4.0 - 10.5 K/uL   RBC 2.36 (L) 4.22 - 5.81 MIL/uL   Hemoglobin 4.7 (LL) 13.0 - 17.0 g/dL   HCT 17.8 (L) 39.0 - 52.0 %   MCV 75.4 (L) 80.0 - 100.0 fL   MCH 19.9 (L) 26.0 - 34.0 pg   MCHC 26.4 (L) 30.0 - 36.0 g/dL   RDW 18.6 (H) 11.5 - 15.5 %   Platelets 390 150 - 400 K/uL   nRBC 0.5 (H) 0.0 - 0.2 %   Neutrophils Relative % 75 %   Neutro Abs 6.3 1.7 - 7.7 K/uL   Lymphocytes Relative 13 %  Lymphs Abs 1.1 0.7 - 4.0 K/uL   Monocytes Relative 8 %   Monocytes Absolute 0.6 0.1 - 1.0 K/uL   Eosinophils Relative 2 %   Eosinophils Absolute 0.2 0.0 - 0.5 K/uL   Basophils Relative 1 %   Basophils Absolute 0.0 0.0 - 0.1 K/uL   Immature Granulocytes 1 %   Abs Immature Granulocytes 0.05 0.00 - 0.07 K/uL  Comprehensive metabolic panel  Result Value Ref Range   Sodium 134 (L) 135 - 145 mmol/L   Potassium 4.1 3.5 - 5.1 mmol/L   Chloride 104 98 - 111 mmol/L   CO2 22 22 - 32 mmol/L   Glucose, Bld 227 (H) 70 - 99 mg/dL   BUN 32 (H) 8 - 23 mg/dL    Creatinine, Ser 1.48 (H) 0.61 - 1.24 mg/dL   Calcium 8.2 (L) 8.9 - 10.3 mg/dL   Total Protein 6.3 (L) 6.5 - 8.1 g/dL   Albumin 2.7 (L) 3.5 - 5.0 g/dL   AST 18 15 - 41 U/L   ALT 16 0 - 44 U/L   Alkaline Phosphatase 56 38 - 126 U/L   Total Bilirubin 0.5 0.3 - 1.2 mg/dL   GFR, Estimated 53 (L) >60 mL/min   Anion gap 8 5 - 15  I-stat chem 8, ED (not at [facility] or [facility])  Result Value Ref Range   Sodium 136 135 - 145 mmol/L   Potassium 4.2 3.5 - 5.1 mmol/L   Chloride 103 98 - 111 mmol/L   BUN 31 (H) 8 - 23 mg/dL   Creatinine, Ser 1.60 (H) 0.61 - 1.24 mg/dL   Glucose, Bld 230 (H) 70 - 99 mg/dL   Calcium, Ion 1.15 1.15 - 1.40 mmol/L   TCO2 26 22 - 32 mmol/L   Hemoglobin 6.1 (LL) 13.0 - 17.0 g/dL   HCT 18.0 (L) 39.0 - 52.0 %   Comment NOTIFIED PHYSICIAN   POC occult blood, ED Provider will collect  Result Value Ref Range   Fecal Occult Bld POSITIVE (A) NEGATIVE  Prepare RBC (crossmatch)  Result Value Ref Range   Order Confirmation      ORDER PROCESSED BY BLOOD BANK Performed at [facility], [address] [place], [place] [address]   Type and screen [place]  Result Value Ref Range   ABO/RH(D) PENDING    Antibody Screen PENDING    Sample Expiration      07/02/2021,2359 Performed at [facility], [place] [address] [place], [place] [address]   Troponin I (High Sensitivity)  Result Value Ref Range   Troponin I (High Sensitivity) 635 (HH) <18 ng/L   DG Chest Portable 1 View  Result Date: 06/29/2021 CLINICAL DATA:  Substernal chest pain. EXAM: PORTABLE CHEST 1 VIEW COMPARISON:  Apr 13, 2021 FINDINGS: A stable single lead ventricular pacer is noted. Mild atelectasis and/or infiltrate is seen within the suprahilar region on the left. There is no evidence of a pleural effusion or pneumothorax. The cardiac silhouette is mildly enlarged. The visualized skeletal structures are unremarkable. IMPRESSION: Stable cardiomegaly with mild left suprahilar atelectasis  and/or infiltrate. Electronically Signed   By: Virgina Norfolk M.D.   On: 06/29/2021 02:57   CUP PACEART REMOTE DEVICE CHECK  Result Date: 06/05/2021 Scheduled remote reviewed. Normal device function.  There was one short NSVT arrhythmia detected, there were no ventricular treated episodes Next remote in 91 days. Kathy Breach, RN, CCDS, CV Remote SolutionsNo anomalies detected.    EKG EKG  Interpretation  Date/Time:  Wednesday June 29 2021 02:30:41 EDT Ventricular Rate:  85 PR Interval:  192 QRS Duration: 106 QT Interval:  366 QTC Calculation: 436 R Axis:   70 Text Interpretation: Sinus arrhythmia Nonspecific repol abnormality, diffuse leads Confirmed by Randal Buba, Leandra Vanderweele ([id number]) on 06/29/2021 2:32:06 AM  Radiology No results found.  Procedures Procedures   Medications Ordered in ED Blood Medications  0.9 %  sodium chloride infusion (has no administration in time range)  cefTRIAXone (ROCEPHIN) 1 g in sodium chloride 0.9 % 100 mL IVPB (has no administration in time range)  azithromycin (ZITHROMAX) 500 mg in sodium chloride 0.9 % 250 mL IVPB (has no administration in time range)  pantoprazole (PROTONIX) 80 mg /NS 100 mL IVPB (has no administration in time range)  pantoprozole (PROTONIX) 80 mg /NS 100 mL infusion (has no administration in time range)  pantoprazole (PROTONIX) injection 40 mg (has no administration in time range)     ED Course  I have reviewed the triage vital signs and the nursing notes.  Pertinent labs & imaging results that were available during my care of the patient were reviewed by me and considered in my medical decision making (see chart for details). 403 case d/w Cardiology fellow, elevated troponin likely secondary to bleeding.  Team will see in am.   MDM Reviewed: previous chart, nursing Long and vitals Reviewed previous: labs and ECG Interpretation: labs, ECG and x-ray Total time providing critical care: 75-105 minutes (blood initiated by me  protonix by me). This excludes time spent performing separately reportable procedures and services. Consults: admitting MD, cardiology and gastrointestinal (secure chat to GI for GIB) CRITICAL CARE Performed by: Lanny Donoso K Andersson Larrabee-Rasch Total critical care time: 90 minutes Critical care time was exclusive of separately billable procedures and treating other patients. Critical care was necessary to treat or prevent imminent or life-threatening deterioration. Critical care was time spent personally by me on the following activities: development of treatment plan with patient and/or surrogate as well as nursing, discussions with consultants, evaluation of patient's response to treatment, examination of patient, obtaining history from patient or surrogate, ordering and performing treatments and interventions, ordering and review of laboratory studies, ordering and review of radiographic studies, pulse oximetry and re-evaluation of patient's condition.   Final Clinical Impression(s) / ED Diagnoses Final diagnoses:  None   Admit to medicine  Rx / DC Orders ED Discharge Orders     None        Clem Wisenbaker, MD 06/29/21 0404    Vonette Grosso, MD 06/29/21 0354

## 2021-06-29 NOTE — ED Notes (Signed)
Blood consent signed per pt.  Pt verbalized understanding of condition and need for transfusion as well as risks and benefits after discussion.  Pt denies prior adverse reactions to transfusion.

## 2021-06-29 NOTE — ED Triage Notes (Signed)
EMS reports pt to have CP onset 2 days ago, intermittent in nature but increasing in severity tonight.  Pt took excedrin x3 PTA at home Nitro x2 per EMS en route with relief of sternal CP.  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

## 2021-06-29 NOTE — Consult Note (Addendum)
[facility] Consult: 3:48 PM 06/29/2021  LOS: 0 days    Referring Provider: Dr Roger Shelter  Primary Care Physician:  Pcp, No Primary Gastroenterologist:  unassigned.     Reason for Consultation:  Chest pain.  Hgb 4.7.     HPI: [name] is a 60 y.o. male.  Hx MI.  Cardiac stenting 2020.  PAF.  Chronic Eliquis.  OSA.   IDDM.  Ischemic cardiomyopathy, LVEF 30% in 03/2020.  ICD implanted 08/2020. IDDM. PAF.  Schizophrenia. Issues with chronic, recurrent transfusion requiring anemia w iron deficiency.  Hgb in 03/2021 was 4.2, received 6 PRBCs.  Previous Feraheme: 04/15/21. 06/19/20/   2017 EGD, colonoscopy at [facility] with resection tubular adenoma. 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  4 nonbleeding duodenal AVMs.  After that started on oral iron and resumed Eliquis. 05/2020 SBE: APC eradication of multiple nonbleeding AVMs in D2, D3. 04/15/2021 SBE: 2, nonbleeding duodenal AVMs treated with APC. 04/16/2021 colonoscopy:  Nonbleeding internal hemorrhoids.  Sigmoid diverticulosis a total of 12 polyps up to 5 mm in size were resected from a sending, transverse, descending colon 04/19/21 VCE: Fast small bowel transit of 52 minutes.  Nonbleeding AVM at just under 2 minutes from first duodenal image.  At least one small, nonbleeding colon polyp.  1 month ago Rx Augmentin for dental abscesses.   At 7/15 heart failure clinic visit, 20 # wt gain/SOB/edema.  Not following Na restricted diet.  BNP 1367.  Hgb not rechecked.  Rx w Lasix dose increase for 3 days.  Pt not getting better.    Presented to ED early this AM w chest pain x 2 days, more intense overnight, ongoing swelling.  Took 3 Excedrin at home.  CP better after Ntg x 2 in ambulance.  Says he takes 2-3 Excedrin up to 8 times a week for various aches  and pains.  His appetite is excellent.  Stools have been dark brown but not tarry, bloody or loose lately.  No abdominal pain.  No dysphagia. Hgb 4.7 >> 2 PRBCs w Lasix >> 6.1 >> 6.5.  Was low 8s in May, 10.3 ten days ago.    MCV 75.   Trop I: 426, 604.  BNP 1620.   AKI, 32/1.4, previously wnl.  Glucose 230.  LFTs wnl.       Past Medical History:  Diagnosis Date   Anxiety    CAD (coronary artery disease)    CHF (congestive heart failure) ([place]) 09/2019   Depression    Diabetes mellitus    Erectile dysfunction 11/2019   H/O right heart catheterization 09/2019   Hypertension    Schizophrenia ([place])    Sleep apnea    uses cpap    Past Surgical History:  Procedure Laterality Date   BIOPSY  04/19/2021   Procedure: BIOPSY;  Surgeon: Doran Stabler, MD;  Location: [place];  Service: Gastroenterology;;   COLONOSCOPY WITH PROPOFOL N/A 04/16/2021   Procedure: COLONOSCOPY WITH PROPOFOL;  Surgeon: [name], MD;  Location: [place];  Service: Gastroenterology;  Laterality:  N/A;   CORONARY STENT PLACEMENT     ENTEROSCOPY N/A 04/08/2020   Procedure: ENTEROSCOPY;  Surgeon: Doran Stabler, MD;  Location: [facility] ENDOSCOPY;  Service: Gastroenterology;  Laterality: N/A;   ENTEROSCOPY N/A 06/17/2020   Procedure: ENTEROSCOPY;  Surgeon: Irene Shipper, MD;  Location: [facility] ENDOSCOPY;  Service: Endoscopy;  Laterality: N/A;   ENTEROSCOPY N/A 04/15/2021   Procedure: ENTEROSCOPY;  Surgeon: [name], MD;  Location: [place];  Service: Gastroenterology;  Laterality: N/A;   ENTEROSCOPY N/A 04/19/2021   Procedure: ENTEROSCOPY;  Surgeon: Doran Stabler, MD;  Location: [place];  Service: Gastroenterology;  Laterality: N/A;   GIVENS CAPSULE STUDY  04/16/2021   Procedure: GIVENS CAPSULE STUDY;  Surgeon: [name], MD;  Location: [place];  Service: Gastroenterology;;   HEMOSTASIS CLIP PLACEMENT  04/08/2020   Procedure: HEMOSTASIS CLIP PLACEMENT;  Surgeon: Doran Stabler, MD;  Location: [place];  Service: Gastroenterology;;   HEMOSTASIS CONTROL  04/08/2020   Procedure: HEMOSTASIS CONTROL;  Surgeon: Doran Stabler, MD;  Location: [facility] ENDOSCOPY;  Service: Gastroenterology;;   HEMOSTASIS CONTROL  06/17/2020   Procedure: HEMOSTASIS CONTROL;  Surgeon: Irene Shipper, MD;  Location: [place];  Service: Endoscopy;;   HERNIA REPAIR     HOT HEMOSTASIS N/A 04/15/2021   Procedure: HOT HEMOSTASIS (ARGON PLASMA COAGULATION/BICAP);  Surgeon: [name], MD;  Location: [place];  Service: Gastroenterology;  Laterality: N/A;   ICD IMPLANT N/A 09/03/2020   Procedure: ICD IMPLANT;  Surgeon: [name], MD;  Location: [place] CV LAB;  Service: Cardiovascular;  Laterality: N/A;   POLYPECTOMY  04/16/2021   Procedure: POLYPECTOMY;  Surgeon: [name], MD;  Location: [facility] ENDOSCOPY;  Service: Gastroenterology;;   RIGHT/LEFT HEART CATH AND CORONARY ANGIOGRAPHY N/A 10/14/2019   Procedure: RIGHT/LEFT HEART CATH AND CORONARY ANGIOGRAPHY;  Surgeon: Belva Crome, MD;  Location: [place] CV LAB;  Service: Cardiovascular;  Laterality: N/A;   SUBMUCOSAL TATTOO INJECTION  04/19/2021   Procedure: SUBMUCOSAL TATTOO INJECTION;  Surgeon: Doran Stabler, MD;  Location: [place];  Service: Gastroenterology;;    Prior to Admission medications   Medication Sig Start Date End Date Taking? Authorizing Provider  acetaminophen (TYLENOL) 500 MG tablet Take 2 tablets (1,000 mg total) by mouth in the morning and at bedtime. 04/19/21  Yes Doda, Edd Arbour, MD  albuterol (VENTOLIN HFA) 108 (90 Base) MCG/ACT inhaler INHALE 2 PUFFS INTO THE LUNGS EVERY 6 (SIX) HOURS AS NEEDED FOR WHEEZING OR SHORTNESS OF BREATH. 01/19/21 01/19/22 Yes Azzie Glatter, FNP  apixaban (ELIQUIS) 5 MG TABS tablet Take 1 tablet (5 mg total) by mouth 2 (two) times daily. 03/02/21 06/06/22 Yes Simmons, Brittainy M, PA-C  atorvastatin (LIPITOR) 40 MG tablet Take 1 tablet (40 mg total) by mouth  daily at 6 PM. 04/21/21 07/25/21 Yes [name], MD  blood glucose meter kit and supplies KIT Dispense based on patient and insurance preference. Use up to four times daily as directed. (FOR ICD-9 250.00, 250.01). 10/16/19  Yes Barb Merino, MD  carvedilol (COREG) 6.25 MG tablet Take 1 tablet (6.25 mg total) by mouth 2 (two) times daily with a meal. 05/11/21  Yes Milford, Maricela Bo, FNP  Continuous Blood Gluc Receiver (FREESTYLE LIBRE 14 DAY READER) DEVI 1 each by Does not apply route as needed. 10/26/20  Yes Azzie Glatter, FNP  cyclobenzaprine (FLEXERIL) 10 MG tablet Take 10 mg by mouth 3 (three) times daily as needed for muscle spasms.  Yes [provider]  dapagliflozin propanediol (FARXIGA) 10 MG TABS tablet Take 1 tablet (10 mg total) by mouth daily before breakfast. 03/29/21  Yes [name], MD  ferrous sulfate 325 (65 FE) MG tablet Take 1 tablet (325 mg total) by mouth every other day. 04/19/21  Yes Armando Reichert, MD  furosemide (LASIX) 40 MG tablet Take 1.5 tablets (60 mg total) by mouth 2 (two) times daily. 05/11/21  Yes Milford, Maricela Bo, FNP  gabapentin (NEURONTIN) 300 MG capsule TAKE 2 CAPSULES (TOTAL = 600 MG), BY MOUTH, 3 TIMES A DAY. 09/13/20 09/13/21 Yes Azzie Glatter, FNP  glipiZIDE (GLUCOTROL) 10 MG tablet TAKE 1 TABLET (10 MG TOTAL) BY MOUTH 2 (TWO) TIMES DAILY BEFORE A MEAL. 10/05/20 10/05/21 Yes Azzie Glatter, FNP  insulin glargine (LANTUS) 100 UNIT/ML injection INJECT 0.2 MLS (20 UNITS TOTAL) INTO THE SKIN DAILY. Patient taking differently: Inject 20 Units into the skin daily. 10/06/20 10/06/21 Yes Azzie Glatter, FNP  Insulin Lispro Prot & Lispro (HUMALOG MIX 75/25 KWIKPEN) (75-25) 100 UNIT/ML Kwikpen Inject 30 Units into the skin 2 (two) times daily. 02/18/20  Yes Azzie Glatter, FNP  Insulin Pen Needle 31G X 6 MM MISC USE AS DIRECTED 11/04/20 11/04/21 Yes Azzie Glatter, FNP  Insulin Syringe-Needle U-100 31G X 5/16" 0.3 ML MISC USE 3 TIMES  DAILY TO INJECT INSULIN 10/26/20 10/26/21 Yes Azzie Glatter, FNP  metFORMIN (GLUCOPHAGE) 500 MG tablet TAKE 1 TABLET (500 MG TOTAL) BY MOUTH 2 (TWO) TIMES DAILY WITH A MEAL. 05/27/21 05/27/22 Yes King, Diona Foley, NP  Multiple Vitamins-Minerals (CENTRUM SILVER 50+MEN) TABS Take 1 tablet by mouth daily.   Yes [provider]  pantoprazole (PROTONIX) 40 MG tablet Take 1 tablet (40 mg total) by mouth daily. 05/31/21 07/09/21 Yes King, Diona Foley, NP  potassium chloride SA (KLOR-CON) 20 MEQ tablet Take 2 tablets (40 mEq total) by mouth daily. 05/11/21  Yes Milford, Maricela Bo, FNP  QUEtiapine (SEROQUEL) 300 MG tablet Take 1 & 1/2 tablet by mouth daily. Patient taking differently: Take 450 mg by mouth daily. 06/22/21  Yes   sacubitril-valsartan (ENTRESTO) 24-26 MG Take 1 tablet by mouth 2 (two) times daily. 05/11/21  Yes Milford, Maricela Bo, FNP  spironolactone (ALDACTONE) 25 MG tablet Take 0.5 tablets (12.5 mg total) by mouth at bedtime. 06/06/21  Yes Milford, [place], FNP  tetrahydrozoline 0.05 % ophthalmic solution Place 1 drop into both eyes daily.   Yes [provider]  TRUEPLUS PEN NEEDLES 31G X 6 MM MISC USE AS DIRECTED 11/04/20  Yes Azzie Glatter, FNP  QUEtiapine (SEROQUEL) 300 MG tablet TAKE 1 TABLET BY MOUTH AT BEDTIME. Patient not taking: Reported on 06/29/2021 02/16/21 02/16/22    QUEtiapine (SEROQUEL) 300 MG tablet take one tab by mouth at bedtime Patient not taking: No sig reported 05/25/21       Scheduled Meds:  sodium chloride   Intravenous Once   atorvastatin  40 mg Oral q1800   carvedilol  6.25 mg Oral BID WC   ferrous sulfate  325 mg Oral QODAY   furosemide  20 mg Intravenous Once   gabapentin  600 mg Oral TID   insulin aspart  0-5 Units Subcutaneous QHS   insulin aspart  0-9 Units Subcutaneous TID WC   insulin glargine-yfgn  20 Units Subcutaneous Daily   multivitamin with minerals  1 tablet Oral Daily   [START ON 07/02/2021] pantoprazole  40 mg Intravenous Q12H    QUEtiapine  450  mg Oral Daily   sacubitril-valsartan  1 tablet Oral BID   sodium chloride flush  3 mL Intravenous Q12H   sodium chloride flush  3 mL Intravenous Q12H   Infusions:  sodium chloride     sodium chloride 100 mL/hr at 06/29/21 0917   sodium chloride     cefTRIAXone (ROCEPHIN)  IV Stopped (06/29/21 0455)   pantoprazole 8 mg/hr (06/29/21 1453)   PRN Meds: sodium chloride, acetaminophen **OR** acetaminophen, bisacodyl, hydrALAZINE, HYDROmorphone (DILAUDID) injection, ipratropium, levalbuterol, naphazoline-glycerin, ondansetron **OR** ondansetron (ZOFRAN) IV, oxyCODONE, senna-docusate, sodium chloride flush   Allergies as of 06/29/2021   (No Known Allergies)    Family History  Problem Relation Age of Onset   Heart failure Mother    Mental illness Sister    Mental illness Sister     Social History   Socioeconomic History   Marital status: Legally Separated    Spouse name: Not on file   Number of children: Not on file   Years of education: Not on file   Highest education level: Not on file  Occupational History   Not on file  Tobacco Use   Smoking status: Former   Smokeless tobacco: Never  Vaping Use   Vaping Use: Never used  Substance and Sexual Activity   Alcohol use: Not Currently    Alcohol/week: 2.0 standard drinks    Types: 2 Cans of beer per week   Drug use: No   Sexual activity: Yes  Other Topics Concern   Not on file  Social History Narrative   Not on file   Social Determinants of Health   Financial Resource Strain: Not on file  Food Insecurity: No Food Insecurity   Worried About Charity fundraiser in the Last Year: Never true   Arboriculturist in the Last Year: Never true  Transportation Needs: Unmet Transportation Needs   Lack of Transportation (Medical): Yes   Lack of Transportation (Non-Medical): Yes  Physical Activity: Not on file  Stress: Not on file  Social Connections: Not on file  Intimate Partner Violence: Not on file     REVIEW OF SYSTEMS: Constitutional: Feeling better over the course of his stay in the ED. ENT:  No nose bleeds Pulm: Denies shortness of breath, denies cough CV:  No palpitations, no LE edema.  Angina at presentation has improved while staying in the ED. GU:  No hematuria, no frequency GI: See HPI. Heme: See HPI. Transfusions: See HPI. Neuro:  No headaches, no peripheral tingling or numbness Derm:  No itching, no rash or sores.  Endocrine:  No sweats or chills.  No polyuria or dysuria Immunization: Reviewed. Travel:  None beyond local counties in last few months.    PHYSICAL EXAM: Vital signs in last 24 hours: Vitals:   06/29/21 1500 06/29/21 1515  BP: 103/68 105/72  Pulse:    Resp:    Temp:    SpO2:     Wt Readings from Last 3 Encounters:  06/15/21 (!) 149.2 kg  06/06/21 (!) 146.7 kg  05/25/21 (!) 149.2 kg    General: Obese, pleasant, calm.  Does not look acutely ill. Head: No facial asymmetry or swelling.  No signs of head trauma. Eyes: Conjunctiva pink.  EOMI.  No scleral icterus Ears: Not hard of hearing Nose: No congestion or discharge Mouth: Just a few of his native teeth remain.  Mucosa is pink, moist, clear.  Tongue midline. Neck: No JVD, no masses, no thyromegaly. Lungs: Clear bilaterally with good  breath sounds.  No cough.  No labored breathing Heart: RRR.  No MRG.  S1, S2 present. Abdomen: Obese, not tender.  Active bowel sounds.  Protuberant versus obese.  No HSM, masses, bruits, hernias appreciated..   Rectal: Deferred.  Stool submitted to the lab tested FOBT positive.  Did not repeat DRE. Musc/Skeltl: No joint redness, swelling or gross deformity. Extremities: Slight pedal/LE edema without pitting. Neurologic: Oriented x3.  Moves all 4 limbs without tremor, grossly intact strength. Skin: No rash, no sores, no telangiectasia. Nodes: No cervical adenopathy. Psych: Calm, cooperative, pleasant.  Intake/Output from previous day: No intake/output data  recorded. Intake/Output this shift: Total I/O In: 1200 [I.V.:400; Blood:800] Out: 1625 [Urine:1625]  LAB RESULTS: Recent Labs    06/29/21 0240 06/29/21 0250 06/29/21 1000  WBC 8.3  --  8.2  HGB 4.7* 6.1* 6.5*  HCT 17.8* 18.0* 23.8*  PLT 390  --  412*   BMET Lab Results  Component Value Date   NA 136 06/29/2021   NA 134 (L) 06/29/2021   NA 138 06/06/2021   K 4.2 06/29/2021   K 4.1 06/29/2021   K 4.3 06/06/2021   CL 103 06/29/2021   CL 104 06/29/2021   CL 103 06/06/2021   CO2 22 06/29/2021   CO2 30 06/06/2021   CO2 29 05/23/2021   GLUCOSE 230 (H) 06/29/2021   GLUCOSE 227 (H) 06/29/2021   GLUCOSE 156 (H) 06/06/2021   BUN 31 (H) 06/29/2021   BUN 32 (H) 06/29/2021   BUN 13 06/06/2021   CREATININE 1.60 (H) 06/29/2021   CREATININE 1.48 (H) 06/29/2021   CREATININE 0.98 06/06/2021   CALCIUM 8.2 (L) 06/29/2021   CALCIUM 9.1 06/06/2021   CALCIUM 8.8 (L) 05/23/2021   LFT Recent Labs    06/29/21 0240  PROT 6.3*  ALBUMIN 2.7*  AST 18  ALT 16  ALKPHOS 56  BILITOT 0.5   PT/INR Lab Results  Component Value Date   INR 0.88 09/23/2011   Hepatitis Panel No results for input(s): HEPBSAG, HCVAB, HEPAIGM, HEPBIGM in the last 72 hours. C-Diff No components found for: CDIFF Lipase  No results found for: LIPASE  Drugs of Abuse     Component Value Date/Time   LABOPIA NONE DETECTED 06/15/2020 1414   COCAINSCRNUR NONE DETECTED 06/15/2020 1414   LABBENZ NONE DETECTED 06/15/2020 1414   AMPHETMU NONE DETECTED 06/15/2020 1414   THCU NONE DETECTED 06/15/2020 1414   LABBARB NONE DETECTED 06/15/2020 1414     RADIOLOGY STUDIES: DG Chest Portable 1 View  Result Date: 06/29/2021 CLINICAL DATA:  Substernal chest pain. EXAM: PORTABLE CHEST 1 VIEW COMPARISON:  Apr 13, 2021 FINDINGS: A stable single lead ventricular pacer is noted. Mild atelectasis and/or infiltrate is seen within the suprahilar region on the left. There is no evidence of a pleural effusion or pneumothorax. The  cardiac silhouette is mildly enlarged. The visualized skeletal structures are unremarkable. IMPRESSION: Stable cardiomegaly with mild left suprahilar atelectasis and/or infiltrate. Electronically Signed   By: Virgina Norfolk M.D.   On: 06/29/2021 02:57      IMPRESSION:     Recurrent severe, acute anemia requiring transfusion.  Several endoscopies/colonoscopies, SBEs, VCE for same.  Most recently had > 10 colon polyps and ablation of duodenal AVMs.  Chronic po iron, Protonix 40 mg/daily.   Also taking regular, heavy doses of Excedrin so could have ulcers.    Chronic Eliquis for CAD, PAF.  Last dose 8/2.  Presented with symptoms of angina which have subsided following blood  transfusion.    Chronic CHF.  Decompensated in setting of severe anemia.     IDDM    PLAN:        SBE??  Will discuss with MD and determine timing.  In the meantime agree with allowing him carb modified diet.  Latest Hgb of 6.5 improved compared to 4.7 at arrival.  Normally would suggest to get 1/3 unit of PRBCs but with his heart failure have to be judicious with transfusion.  Protonix drip may be overkill but will leave it in place for the time being.   Azucena Freed  06/29/2021, 3:48 PM Phone [phone]

## 2021-06-29 NOTE — ED Notes (Addendum)
IV pump with PRBCs running noted to continuously beep. Stating occlusion on pt side. This RN evaluated tubing,  And noted that section of tubing that is inserted in pump appeared to be clotted. Transfusion stopped and PRBCs primed with new tubing. Incident re-occurred and transfusion stopped and disconnected from pt. Second RN called to bedside to assess and blood bank called. Blood bank personnel called to bedside and assessed tubing, unit taken back to blood bank. Update also given to receiving RN.

## 2021-06-29 NOTE — ED Notes (Signed)
Blood transfusion bag # 3 initiated

## 2021-06-30 ENCOUNTER — Inpatient Hospital Stay (HOSPITAL_COMMUNITY): Payer: Medicare (Managed Care)

## 2021-06-30 ENCOUNTER — Other Ambulatory Visit (HOSPITAL_COMMUNITY): Payer: Self-pay

## 2021-06-30 DIAGNOSIS — I5022 Chronic systolic (congestive) heart failure: Secondary | ICD-10-CM | POA: Diagnosis not present

## 2021-06-30 DIAGNOSIS — R778 Other specified abnormalities of plasma proteins: Secondary | ICD-10-CM | POA: Diagnosis not present

## 2021-06-30 DIAGNOSIS — D62 Acute posthemorrhagic anemia: Secondary | ICD-10-CM | POA: Diagnosis not present

## 2021-06-30 DIAGNOSIS — Z7901 Long term (current) use of anticoagulants: Secondary | ICD-10-CM | POA: Diagnosis not present

## 2021-06-30 DIAGNOSIS — I5023 Acute on chronic systolic (congestive) heart failure: Secondary | ICD-10-CM | POA: Diagnosis not present

## 2021-06-30 DIAGNOSIS — K922 Gastrointestinal hemorrhage, unspecified: Secondary | ICD-10-CM | POA: Diagnosis not present

## 2021-06-30 DIAGNOSIS — D500 Iron deficiency anemia secondary to blood loss (chronic): Secondary | ICD-10-CM | POA: Diagnosis not present

## 2021-06-30 LAB — ECHOCARDIOGRAM COMPLETE
AR max vel: 2.44 cm2
AV Area VTI: 2.59 cm2
AV Area mean vel: 2.5 cm2
AV Mean grad: 9 mmHg
AV Peak grad: 16.3 mmHg
Ao pk vel: 2.02 m/s
Area-P 1/2: 4.44 cm2
Calc EF: 48 %
S' Lateral: 4.6 cm
Single Plane A2C EF: 46.9 %
Single Plane A4C EF: 48.7 %

## 2021-06-30 LAB — COMPREHENSIVE METABOLIC PANEL
ALT: 17 U/L (ref 0–44)
AST: 17 U/L (ref 15–41)
Albumin: 2.9 g/dL — ABNORMAL LOW (ref 3.5–5.0)
Alkaline Phosphatase: 57 U/L (ref 38–126)
Anion gap: 10 (ref 5–15)
BUN: 26 mg/dL — ABNORMAL HIGH (ref 8–23)
CO2: 22 mmol/L (ref 22–32)
Calcium: 8.6 mg/dL — ABNORMAL LOW (ref 8.9–10.3)
Chloride: 102 mmol/L (ref 98–111)
Creatinine, Ser: 1.4 mg/dL — ABNORMAL HIGH (ref 0.61–1.24)
GFR, Estimated: 57 mL/min — ABNORMAL LOW (ref >60)
Glucose, Bld: 177 mg/dL — ABNORMAL HIGH (ref 70–99)
Potassium: 4.6 mmol/L (ref 3.5–5.1)
Sodium: 134 mmol/L — ABNORMAL LOW (ref 135–145)
Total Bilirubin: 0.6 mg/dL (ref 0.3–1.2)
Total Protein: 6.9 g/dL (ref 6.5–8.1)

## 2021-06-30 LAB — IRON AND TIBC
Iron: 15 ug/dL — ABNORMAL LOW (ref 45–182)
Saturation Ratios: 3 % — ABNORMAL LOW (ref 17.9–39.5)
TIBC: 479 ug/dL — ABNORMAL HIGH (ref 250–450)
UIBC: 464 ug/dL

## 2021-06-30 LAB — CBC
HCT: 25.1 % — ABNORMAL LOW (ref 39.0–52.0)
HCT: 24 % — ABNORMAL LOW (ref 39.0–52.0)
Hemoglobin: 7 g/dL — ABNORMAL LOW (ref 13.0–17.0)
Hemoglobin: 6.6 g/dL — CL (ref 13.0–17.0)
MCH: 22 pg — ABNORMAL LOW (ref 26.0–34.0)
MCH: 21.6 pg — ABNORMAL LOW (ref 26.0–34.0)
MCHC: 27.9 g/dL — ABNORMAL LOW (ref 30.0–36.0)
MCHC: 27.5 g/dL — ABNORMAL LOW (ref 30.0–36.0)
MCV: 78.9 fL — ABNORMAL LOW (ref 80.0–100.0)
MCV: 78.7 fL — ABNORMAL LOW (ref 80.0–100.0)
Platelets: 398 10*3/uL (ref 150–400)
Platelets: 382 10*3/uL (ref 150–400)
RBC: 3.18 MIL/uL — ABNORMAL LOW (ref 4.22–5.81)
RBC: 3.05 MIL/uL — ABNORMAL LOW (ref 4.22–5.81)
RDW: 18.2 % — ABNORMAL HIGH (ref 11.5–15.5)
RDW: 18.6 % — ABNORMAL HIGH (ref 11.5–15.5)
WBC: 7.8 10*3/uL (ref 4.0–10.5)
WBC: 8.5 10*3/uL (ref 4.0–10.5)
nRBC: 1 % — ABNORMAL HIGH (ref 0.0–0.2)
nRBC: 0.9 % — ABNORMAL HIGH (ref 0.0–0.2)

## 2021-06-30 LAB — GLUCOSE, CAPILLARY
Glucose-Capillary: 151 mg/dL — ABNORMAL HIGH (ref 70–99)
Glucose-Capillary: 228 mg/dL — ABNORMAL HIGH (ref 70–99)
Glucose-Capillary: 158 mg/dL — ABNORMAL HIGH (ref 70–99)
Glucose-Capillary: 211 mg/dL — ABNORMAL HIGH (ref 70–99)

## 2021-06-30 LAB — PROTIME-INR
INR: 1 (ref 0.8–1.2)
Prothrombin Time: 13 seconds (ref 11.4–15.2)

## 2021-06-30 LAB — PREPARE RBC (CROSSMATCH)

## 2021-06-30 LAB — FERRITIN: Ferritin: 12 ng/mL — ABNORMAL LOW (ref 24–336)

## 2021-06-30 MED ORDER — FUROSEMIDE 10 MG/ML IJ SOLN
20.0000 mg | Freq: Once | INTRAMUSCULAR | Status: AC
  Administered 2021-06-30: 20 mg via INTRAVENOUS

## 2021-06-30 MED ORDER — SODIUM CHLORIDE 0.9% IV SOLUTION: Freq: Once | INTRAVENOUS | Status: AC

## 2021-06-30 MED ORDER — FUROSEMIDE 10 MG/ML IJ SOLN
20.0000 mg | Freq: Once | INTRAMUSCULAR | Status: AC
  Administered 2021-07-01: 20 mg via INTRAVENOUS
  Filled 2021-06-30: qty 2

## 2021-06-30 MED ORDER — SODIUM CHLORIDE 0.9 % IV SOLN
510.0000 mg | INTRAVENOUS | Status: DC
  Administered 2021-06-30: 510 mg via INTRAVENOUS
  Filled 2021-06-30 (×2): qty 17

## 2021-06-30 MED ORDER — PERFLUTREN LIPID MICROSPHERE
1.0000 mL | INTRAVENOUS | Status: AC | PRN
  Administered 2021-06-30: 2 mL via INTRAVENOUS
  Filled 2021-06-30: qty 10

## 2021-06-30 MED ORDER — DAPAGLIFLOZIN PROPANEDIOL 10 MG PO TABS
10.0000 mg | ORAL_TABLET | Freq: Every day | ORAL | Status: DC
  Administered 2021-06-30 – 2021-07-02 (×3): 10 mg via ORAL
  Filled 2021-06-30 (×3): qty 1

## 2021-06-30 NOTE — Progress Notes (Signed)
Progress Note   Subjective  Patient feeling better after blood transfusion No abdominal pain No melenic stools or visible blood in stool   Objective  Vital signs in last 24 hours: Temp:  [97.6 F (36.4 C)-98.6 F (37 C)] 98.1 F (36.7 C) (08/04 0751) Pulse Rate:  [41-79] 74 (08/04 0751) Resp:  [11-24] 18 (08/04 0751) BP: (91-136)/(51-85) 111/65 (08/04 0751) SpO2:  [90 %-100 %] 96 % (08/04 0751) Weight:  [151.8 kg] 151.8 kg (08/04 1100)    General: Alert, well-developed, in NAD Heart:  Regular rate and rhythm; no murmurs Chest: Clear to ascultation bilaterally Abdomen:  Soft, obese, nontender and nondistended. Normal bowel sounds, without guarding, and without rebound.   Extremities:  Without edema. Neurologic:  Alert and  oriented x4; grossly normal neurologically. Psych:  Alert and cooperative. Normal mood and affect.  Intake/Output from previous day: 08/03 0701 - 08/04 0700 In: 1887.5 [I.V.:400; Blood:1487.5] Out: 1975 [Urine:1975] Intake/Output this shift: Total I/O In: 240 [P.O.:240] Out: -   Lab Results: Recent Labs    06/29/21 0240 06/29/21 0250 06/29/21 1000 06/30/21 0113  WBC 8.3  --  8.2 7.8  HGB 4.7* 6.1* 6.5* 7.0*  HCT 17.8* 18.0* 23.8* 25.1*  PLT 390  --  412* 398   BMET Recent Labs    06/29/21 0240 06/29/21 0250 06/30/21 0113  NA 134* 136 134*  K 4.1 4.2 4.6  CL 104 103 102  CO2 22  --  22  GLUCOSE 227* 230* 177*  BUN 32* 31* 26*  CREATININE 1.48* 1.60* 1.40*  CALCIUM 8.2*  --  8.6*   LFT Recent Labs    06/30/21 0113  PROT 6.9  ALBUMIN 2.9*  AST 17  ALT 17  ALKPHOS 57  BILITOT 0.6   PT/INR Recent Labs    06/30/21 0113  LABPROT 13.0  INR 1.0   Hepatitis Panel No results for input(s): HEPBSAG, HCVAB, HEPAIGM, HEPBIGM in the last 72 hours.  Studies/Results: DG Chest Portable 1 View  Result Date: 06/29/2021 CLINICAL DATA:  Substernal chest pain. EXAM: PORTABLE CHEST 1 VIEW COMPARISON:  Apr 13, 2021 FINDINGS: A  stable single lead ventricular pacer is noted. Mild atelectasis and/or infiltrate is seen within the suprahilar region on the left. There is no evidence of a pleural effusion or pneumothorax. The cardiac silhouette is mildly enlarged. The visualized skeletal structures are unremarkable. IMPRESSION: Stable cardiomegaly with mild left suprahilar atelectasis and/or infiltrate. Electronically Signed   By: Virgina Norfolk M.D.   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  IDA/intestinal angioectasias --he had 2 enteroscopies in May, the latter after a video capsule endoscopy showed an angiectasia.  This was unable to be reached on the second enteroscopy.  He is again severely iron deficient and profoundly anemic --Appropriate response to 3 units of packed red cells, hemoglobin now 7.0 --I would consider additional unit of red cell transfusion but will defer this to primary team and cardiology given his heart failure --Plan small bowel enteroscopy tomorrow; if no angioectasias found then he will need an outpatient single balloon enteroscopy --Hold Eliquis --I ordered IV iron today through pharmacy, will need to complete this as an outpatient in the infusion center      LOS:  1 day   Jerene Bears  06/30/2021, 12:37 PM See Shea Evans, [facility], to contact our on call provider

## 2021-06-30 NOTE — H&P (View-Only) (Signed)
Progress Note   Subjective  Patient feeling better after blood transfusion No abdominal pain No melenic stools or visible blood in stool   Objective  Vital signs in last 24 hours: Temp:  [97.6 F (36.4 C)-98.6 F (37 C)] 98.1 F (36.7 C) (08/04 0751) Pulse Rate:  [41-79] 74 (08/04 0751) Resp:  [11-24] 18 (08/04 0751) BP: (91-136)/(51-85) 111/65 (08/04 0751) SpO2:  [90 %-100 %] 96 % (08/04 0751) Weight:  [151.8 kg] 151.8 kg (08/04 1100)    General: Alert, well-developed, in NAD Heart:  Regular rate and rhythm; no murmurs Chest: Clear to ascultation bilaterally Abdomen:  Soft, obese, nontender and nondistended. Normal bowel sounds, without guarding, and without rebound.   Extremities:  Without edema. Neurologic:  Alert and  oriented x4; grossly normal neurologically. Psych:  Alert and cooperative. Normal mood and affect.  Intake/Output from previous day: 08/03 0701 - 08/04 0700 In: 1887.5 [I.V.:400; Blood:1487.5] Out: 1975 [Urine:1975] Intake/Output this shift: Total I/O In: 240 [P.O.:240] Out: -   Lab Results: Recent Labs    06/29/21 0240 06/29/21 0250 06/29/21 1000 06/30/21 0113  WBC 8.3  --  8.2 7.8  HGB 4.7* 6.1* 6.5* 7.0*  HCT 17.8* 18.0* 23.8* 25.1*  PLT 390  --  412* 398   BMET Recent Labs    06/29/21 0240 06/29/21 0250 06/30/21 0113  NA 134* 136 134*  K 4.1 4.2 4.6  CL 104 103 102  CO2 22  --  22  GLUCOSE 227* 230* 177*  BUN 32* 31* 26*  CREATININE 1.48* 1.60* 1.40*  CALCIUM 8.2*  --  8.6*   LFT Recent Labs    06/30/21 0113  PROT 6.9  ALBUMIN 2.9*  AST 17  ALT 17  ALKPHOS 57  BILITOT 0.6   PT/INR Recent Labs    06/30/21 0113  LABPROT 13.0  INR 1.0   Hepatitis Panel No results for input(s): HEPBSAG, HCVAB, HEPAIGM, HEPBIGM in the last 72 hours.  Studies/Results: DG Chest Portable 1 View  Result Date: 06/29/2021 CLINICAL DATA:  Substernal chest pain. EXAM: PORTABLE CHEST 1 VIEW COMPARISON:  Apr 13, 2021 FINDINGS: A  stable single lead ventricular pacer is noted. Mild atelectasis and/or infiltrate is seen within the suprahilar region on the left. There is no evidence of a pleural effusion or pneumothorax. The cardiac silhouette is mildly enlarged. The visualized skeletal structures are unremarkable. IMPRESSION: Stable cardiomegaly with mild left suprahilar atelectasis and/or infiltrate. Electronically Signed   By: Virgina Norfolk M.D.   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  IDA/intestinal angioectasias --he had 2 enteroscopies in May, the latter after a video capsule endoscopy showed an angiectasia.  This was unable to be reached on the second enteroscopy.  He is again severely iron deficient and profoundly anemic --Appropriate response to 3 units of packed red cells, hemoglobin now 7.0 --I would consider additional unit of red cell transfusion but will defer this to primary team and cardiology given his heart failure --Plan small bowel enteroscopy tomorrow; if no angioectasias found then he will need an outpatient single balloon enteroscopy --Hold Eliquis --I ordered IV iron today through pharmacy, will need to complete this as an outpatient in the infusion center      LOS:  1 day   Jerene Bears  06/30/2021, 12:37 PM See Shea Evans, [facility], to contact our on call provider

## 2021-06-30 NOTE — Evaluation (Signed)
Occupational Therapy Evaluation Patient Details Name: [name] MRN: [id number] DOB: August 27, 1961 Today's Date: 06/30/2021    History of Present Illness Pt adm 8/3 with chest pain and found to have iron deficiency anemia due to GI blood loss from gastric and small bowel angioectasias. Hgb initially 4.7. Pt transfused 3 units with Hgb up to 7.0. PMH - chronic systolic heart failure, CAD, iron deficiency anemia, shizophrenia, depression, DM, CKD, ICD   Clinical Impression   Pt is functioning modified independently. No OT needs.     Follow Up Recommendations  No OT follow up    Equipment Recommendations  None recommended by OT    Recommendations for Other Services       Precautions / Restrictions Precautions Precautions: None      Mobility Bed Mobility Overal bed mobility: Independent             General bed mobility comments: at EOB    Transfers Overall transfer level: Modified independent Equipment used: None                  Balance Overall balance assessment: No apparent balance deficits (not formally assessed)                                         ADL either performed or assessed with clinical judgement   ADL Overall ADL's : Modified independent                                             Vision Baseline Vision/History: Wears glasses Wears Glasses: Reading only Patient Visual Report: No change from baseline       Perception     Praxis      Pertinent Vitals/Pain Pain Assessment: No/denies pain     Hand Dominance Right   Extremity/Trunk Assessment Upper Extremity Assessment Upper Extremity Assessment: Overall WFL for tasks assessed   Lower Extremity Assessment Lower Extremity Assessment: Defer to PT evaluation   Cervical / Trunk Assessment Cervical / Trunk Assessment: Other exceptions Cervical / Trunk Exceptions: obesity   Communication Communication Communication: No difficulties    Cognition Arousal/Alertness: Awake/alert Behavior During Therapy: WFL for tasks assessed/performed Overall Cognitive Status: Within Functional Limits for tasks assessed                                     General Comments  Pt amb on RA with SpO2 >90% after amb    Exercises     Shoulder Instructions      Home Living Family/patient expects to be discharged to:: Private residence Living Arrangements: Alone   Type of Home: Apartment       Home Layout: One level     Bathroom Shower/Tub: Teacher, early years/pre: Standard     Home Equipment: None          Prior Functioning/Environment Level of Independence: Independent        Comments: reports he keeps a clean house        OT Problem List:        OT Treatment/Interventions:      OT Goals(Current goals can be found in the care plan section)    OT Frequency:  Barriers to D/C:            Co-evaluation              AM-PAC OT "6 Clicks" Daily Activity     Outcome Measure Help from another person eating meals?: None Help from another person taking care of personal grooming?: None Help from another person toileting, which includes using toliet, bedpan, or urinal?: None Help from another person bathing (including washing, rinsing, drying)?: None Help from another person to put on and taking off regular upper body clothing?: None Help from another person to put on and taking off regular lower body clothing?: None 6 Click Score: 24   End of Session Nurse Communication: Other (comment) (sp02 down to 85-88% on RA, 95% on 2L)  Activity Tolerance: Patient tolerated treatment well Patient left: in chair;with call bell/phone within reach  OT Visit Diagnosis: Muscle weakness (generalized) (M62.81)                Time: [id number] OT Time Calculation (min): 17 min Charges:  OT General Charges $OT Visit: 1 Visit OT Evaluation $OT Eval Low Complexity: 1 Low  Nestor Lewandowsky,  OTR/L Acute Rehabilitation Services Pager: [phone] Office: [phone]  Malka So 06/30/2021, 3:13 PM

## 2021-06-30 NOTE — Telephone Encounter (Signed)
Advanced Heart Failure Patient Advocate Encounter  Called [employer] to check the status of the patient's application. Representative stated that the patient needed to spend $129.05 in order to meet the 3% OOP requirement requirement for assistance.   Advanced Heart Failure Patient Advocate Encounter   Patient was approved to receive Wilder Glade from [place]:[id number])  Effective dates: 06/30/21 through 09/28/21  The patient needs to apply for and be denied Medicaid in order to get full approval. He can call [phone] to apply. It will take 10-15 days from today for the medication to be shipped. The representative advised that the patient will have to call and request a refill during the temporary approval stage. He should call about 2 weeks after he receives the first shipment to request another 30 day refill.   Relayed above information to [employer] (EMT).  Charlann Boxer, CPhT

## 2021-06-30 NOTE — Evaluation (Signed)
Physical Therapy Evaluation Patient Details Name: [name] MRN: [id number] DOB: 1961-09-04 Today's Date: 06/30/2021   History of Present Illness  Pt adm 8/3 with chest pain and found to have iron deficiency anemia due to GI blood loss from gastric and small bowel angioectasias. Hgb initially 4.7. Pt transfused 3 units with Hgb up to 7.0. PMH - chronic systolic heart failure, CAD, iron deficiency anemia, shizophrenia, depression, DM, CKD, ICD  Clinical Impression  Pt doing well with mobility and no further PT needed.      Follow Up Recommendations No PT follow up    Equipment Recommendations  None recommended by PT    Recommendations for Other Services       Precautions / Restrictions Precautions Precautions: None      Mobility  Bed Mobility Overal bed mobility: Independent                  Transfers Overall transfer level: Modified independent Equipment used: None                Ambulation/Gait Ambulation/Gait assistance: Modified independent (Device/Increase time) Gait Distance (Feet): 225 Feet Assistive device: None;IV Pole Gait Pattern/deviations: Step-through pattern;Decreased stride length;Wide base of support Gait velocity: decr Gait velocity interpretation: 1.31 - 2.62 ft/sec, indicative of limited community ambulator General Gait Details: Steady gait  Stairs            Wheelchair Mobility    Modified Rankin (Stroke Patients Only)       Balance Overall balance assessment: No apparent balance deficits (not formally assessed)                                           Pertinent Vitals/Pain Pain Assessment: No/denies pain    Home Living Family/patient expects to be discharged to:: Private residence Living Arrangements: Alone   Type of Home: Apartment         Home Equipment: None      Prior Function Level of Independence: Independent               Hand Dominance         Extremity/Trunk Assessment   Upper Extremity Assessment Upper Extremity Assessment: Defer to OT evaluation    Lower Extremity Assessment Lower Extremity Assessment: Overall WFL for tasks assessed       Communication   Communication: No difficulties  Cognition Arousal/Alertness: Awake/alert Behavior During Therapy: WFL for tasks assessed/performed Overall Cognitive Status: Within Functional Limits for tasks assessed                                        General Comments General comments (skin integrity, edema, etc.): Pt amb on RA with SpO2 >90% after amb    Exercises     Assessment/Plan    PT Assessment Patent does not need any further PT services  PT Problem List         PT Treatment Interventions      PT Goals (Current goals can be found in the Care Plan section)  Acute Rehab PT Goals PT Goal Formulation: All assessment and education complete, DC therapy    Frequency     Barriers to discharge        Co-evaluation  AM-PAC PT "6 Clicks" Mobility  Outcome Measure Help needed turning from your back to your side while in a flat bed without using bedrails?: None Help needed moving from lying on your back to sitting on the side of a flat bed without using bedrails?: None Help needed moving to and from a bed to a chair (including a wheelchair)?: None Help needed standing up from a chair using your arms (e.g., wheelchair or bedside chair)?: None Help needed to walk in hospital room?: None Help needed climbing 3-5 steps with a railing? : A Little 6 Click Score: 23    End of Session   Activity Tolerance: Patient tolerated treatment well Patient left: in bed;with call bell/phone within reach (sitting EOB)   PT Visit Diagnosis: Other abnormalities of gait and mobility (R26.89)    Time: [id number] PT Time Calculation (min) (ACUTE ONLY): 18 min   Charges:   PT Evaluation $PT Eval Low Complexity: 1 Low          [facility] PT Acute Rehabilitation Services Pager [phone] Office [place] 06/30/2021, 2:35 PM

## 2021-06-30 NOTE — Progress Notes (Addendum)
Advanced Heart Failure Rounding Note  PCP-Cardiologist: [name], MD   Subjective:     Hgb up to 7 after 3 units RBCs. 4th unit stopped last night d/t clot in pump.  Creatinine 1.60 > 1.40  Seen at bedside during echo. Feels much better today. No recurrent dyspnea or CP.   Objective:   Weight Range:   There is no height or weight on file to calculate BMI.   Vital Signs:   Temp:  [97.6 F (36.4 C)-98.6 F (37 C)] 98.1 F (36.7 C) (08/04 0751) Pulse Rate:  [41-79] 74 (08/04 0751) Resp:  [11-24] 18 (08/04 0751) BP: (91-136)/(51-96) 111/65 (08/04 0751) SpO2:  [90 %-100 %] 96 % (08/04 0751)    Weight change: There were no vitals filed for this visit.  Intake/Output:   Intake/Output Summary (Last 24 hours) at 06/30/2021 0854 Last data filed at 06/29/2021 1943 Gross per 24 hour  Intake 1887.5 ml  Output 1225 ml  Net 662.5 ml      Physical Exam    General:  Well appearing. No resp difficulty. HEENT: Normal Neck: Supple. JVP difficult to assess. Carotids 2+ bilat; no bruits. No lymphadenopathy or thyromegaly appreciated. Cor: PMI nondisplaced. Regular rate & rhythm. No rubs, gallops or murmurs. Lungs: CTA anteriorly Abdomen: Soft, nontender, nondistended. No hepatosplenomegaly. No bruits or masses. Good bowel sounds. Extremities: No cyanosis, clubbing, rash, edema Neuro: Alert & orientedx3, cranial nerves grossly intact. moves all 4 extremities w/o difficulty. Affect pleasant   Telemetry   NSR 60s-70s  EKG    No new ECG for review  Labs    CBC Recent Labs    06/29/21 0240 06/29/21 0250 06/29/21 1000 06/30/21 0113  WBC 8.3  --  8.2 7.8  NEUTROABS 6.3  --   --   --   HGB 4.7*   < > 6.5* 7.0*  HCT 17.8*   < > 23.8* 25.1*  MCV 75.4*  --  77.8* 78.9*  PLT 390  --  412* 398   < > = values in this interval not displayed.   Basic Metabolic Panel Recent Labs    06/29/21 0240 06/29/21 0250 06/30/21 0113  NA 134* 136 134*  K 4.1 4.2 4.6  CL  104 103 102  CO2 22  --  22  GLUCOSE 227* 230* 177*  BUN 32* 31* 26*  CREATININE 1.48* 1.60* 1.40*  CALCIUM 8.2*  --  8.6*   Liver Function Tests Recent Labs    06/29/21 0240 06/30/21 0113  AST 18 17  ALT 16 17  ALKPHOS 56 57  BILITOT 0.5 0.6  PROT 6.3* 6.9  ALBUMIN 2.7* 2.9*   No results for input(s): LIPASE, AMYLASE in the last 72 hours. Cardiac Enzymes No results for input(s): CKTOTAL, CKMB, CKMBINDEX, TROPONINI in the last 72 hours.  BNP: BNP (last 3 results) Recent Labs    05/11/21 1614 06/29/21 0240 06/29/21 1654  BNP 110.8* 246.0* 349.4*    ProBNP (last 3 results) Recent Labs    02/23/21 1315  PROBNP 249*     D-Dimer No results for input(s): DDIMER in the last 72 hours. Hemoglobin A1C Recent Labs    06/29/21 1000  HGBA1C 6.5*   Fasting Lipid Panel No results for input(s): CHOL, HDL, LDLCALC, TRIG, CHOLHDL, LDLDIRECT in the last 72 hours. Thyroid Function Tests No results for input(s): TSH, T4TOTAL, T3FREE, THYROIDAB in the last 72 hours.  Invalid input(s): FREET3  Other results:   Imaging    No  results found.   Medications:     Scheduled Medications:  sodium chloride   Intravenous Once   atorvastatin  40 mg Oral q1800   carvedilol  6.25 mg Oral BID WC   ferrous sulfate  325 mg Oral QODAY   furosemide  20 mg Intravenous Once   gabapentin  600 mg Oral TID   insulin aspart  0-5 Units Subcutaneous QHS   insulin aspart  0-9 Units Subcutaneous TID WC   insulin glargine-yfgn  20 Units Subcutaneous Daily   multivitamin with minerals  1 tablet Oral Daily   [START ON 07/02/2021] pantoprazole  40 mg Intravenous Q12H   QUEtiapine  450 mg Oral Daily   sacubitril-valsartan  1 tablet Oral BID   sodium chloride flush  3 mL Intravenous Q12H   sodium chloride flush  3 mL Intravenous Q12H    Infusions:  sodium chloride     sodium chloride     cefTRIAXone (ROCEPHIN)  IV 1 g (06/30/21 0426)   pantoprazole 8 mg/hr (06/30/21 0042)    PRN  Medications: sodium chloride, acetaminophen **OR** acetaminophen, bisacodyl, hydrALAZINE, HYDROmorphone (DILAUDID) injection, ipratropium, levalbuterol, naphazoline-glycerin, ondansetron **OR** ondansetron (ZOFRAN) IV, oxyCODONE, senna-docusate, sodium chloride flush    Assessment/Plan   Chest pain/elevated troponin with known CAD: -CP in setting of severe anemia. No recurrence after transfusions. -HS troponin [id number]. -Troponin elevation likely secondary to demand ischemia from severe anemia.  -Does have known CAD. Last cath 11/20220 with occluded OM2 stent, 90% small OM1, diffuse disease RCA treated medically.  Slight ST depression in inferolateral leads in territory of known disease. -Would only consider LHC if recurrent chest pain after treating anemia. -On statin. No aspirin d/t GI bleeding.   2. 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 On po supplement. Consider IV -Continue PPI -GI consulted - planning for small bowel enteroscopy (possibly outpatient if hemoglobin stabilizes) -Avoid NSAIDs in future - took Excedrin PTA   3. Paroxysmal atrial fibrillation: -Maintaining sinus rhythm -CHADS2-VASc score = 4 (CAD, CHF, HTN, DM), had been on anticoagulation with Eliquis. Holding d/t GI bleed.  -D/t recurrent GI bleeding, will arrange referral to discuss left atrial appendage occlusion with watchman device after discharge.   4. Chronic systolic CHF:  -Echo in [id number] with EF 25-30%, echo 5/21 with EF 30% with mildly decreased RV systolic function. Biotronik ICD.  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   -Has cut back on alcohol consumption significantly, should avoid altogether -Repeat echo pending -No recorded weight. Denies weight gain at home. NYHA class IV symptoms on admit  but likely d/t anemia. Does not appear significantly overloaded. BNP slightly her than in June. -Okay with 20 mg IV lasix after each transfusion.  -Restart home po lasix once transfusions completed -Continue Entresto 24/26/mg bid. -Continue Coreg 6.25 mg bid.   -Add back Farxiga 10 mg daily -Consider restarting spiro tomorrow.   5. HTN: -BP stable.   6. OSA:  -CPAP machine dated and does not work. Updated sleep study ordered at last HF visit   7. AKI on CKD: -Likely d/t volume depletion in setting of severe anemia -Cr 1.60 > 1.40 (baseline 0.9-1.2)   Length of Stay: 1  FINCH, LINDSAY N, PA-C  06/30/2021, 8:54 AM  Advanced Heart Failure Team Pager [phone] (M-F; 7a - 5p)  Please contact [facility]  for night-coverage after hours (5p -7a ) and weekends on [email or web address]   Patient seen with PA, agree with the above note.   No chest pain.  Creatinine trending down.  BP stable.   - Transfuse to keep hgb > 8.  - IV Fe - SB enteroscopy tomorrow to assess for AVMs.  - Agree with restart Wilder Glade today and likely spironolactone tomorrow.  - Will likely restart home Lasix tomorrow.  - Hold Eliquis for now, will need Watchman evaluation as outpatient.  - Will review echo.   Loralie Champagne 06/30/2021 2:08 PM

## 2021-06-30 NOTE — Progress Notes (Signed)
PROGRESS NOTE    [name]  [id number] DOB: February 19, 1961 DOA: 06/29/2021 PCP: Pcp, No    Chief Complaint  Patient presents with   Chest Pain    EMS reports pt to have CP onset 2 days ago, intermittent in nature but increasing in severity tonight.  Pt took excedrin x3 PTA at home Nitro x2 per EMS en route with relief of sternal CP.  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 from gastric and small bowel angioectasias, history of multiple colon polyps, CAD and atrial fibrillation on Eliquis, chronic CHF admitted with recurrent profound symptomatic iron deficiency anemia   Assessment & Plan:   Principal Problem:   GI bleed Active Problems:   Paranoid schizophrenia, chronic condition (HCC)   Severe recurrent major depressive disorder with psychotic features (HCC)   GAD (generalized anxiety disorder)   OCD (obsessive compulsive disorder)   CHF (congestive heart failure) (HCC)   Type 2 diabetes mellitus with hyperglycemia, with long-term current use of insulin (HCC)   Acute on chronic systolic (congestive) heart failure (HCC)   PAF (paroxysmal atrial fibrillation) (HCC)   OSA on CPAP   Anemia due to chronic blood loss   CKD (chronic kidney disease), stage III (HCC)   Acute blood loss anemia   Chronic anticoagulation   ICD (implantable cardioverter-defibrillator) in place   Severe anemia   Acute blood Loss anemia due to GI bleed -Patient with known history of recurrent GI bleed in the past, on Eliquis, he just recently had 2 endoscopies in May, work-up significant for intestinal angiectasias -Plan for enteroscopy tomorrow -Her H&H closely, will repeat CBC currently and transfuse for target hemoglobin of 8 -Continue with IV Protonix twice daily -Continue to hold Eliquis -will receive IV iron GI recommendation  Chest pain, with elevated troponin with known CAD -This is most likely demand  ischemia in the setting of severe anemia - HS troponin [id number]. -Cardiology input greatly appreciated, they would only consider LHC if recurrent chest pain after treating his anemia. -Continue with statin, no aspirin due to GI bleed    Paranoid schizophrenia, chronic condition ([place]) -currently stable, continue with home medications    severe recurrent major depressive disorder with psychotic features ([place])  -continue home medication including high-dose Seroquel    GAD (generalized anxiety disorder) -stable, continue anxiolytics     OCD (obsessive compulsive disorder) -stable resuming home meds    Chronic dCHF (congestive heart failure) (HCC)  -No signs of exacerbation, monitoring for volume overload with PRBC transfusion -we will pursue with IV Lasix today, resuming oral Lasix in a.m. -Holding home medication of spironolactone, continue Entresto -We will monitor daily weight, I's and O's -Last echocardiogram reviewed 04/15/2020, ejection fraction 30%, with grade 2 diastolic dysfunction    Type 2 diabetes mellitus with hyperglycemia, with long-term current use of insulin (HCC) -Withholding home medications of insulin lispro 75/25, Glucotrol, continue home medication of Lantus at 20 units nightly and insulin sliding scale    PAF (paroxysmal atrial fibrillation) ([place])  -continue home medication of Coreg, with holding Eliquis  OSA on CPAP  -currently stable, supplemental O2    CKD (chronic kidney disease), stage III (HCC) -mildly elevated creatinine from baseline , creatinine this morning 1.60, BUN 31 --likely exacerbated by severe anemia -we will monitor closely, avoiding nephrotoxins     Chronic anticoagulation -on Eliquis due to A. fib on hold now  ICD (implantable cardioverter-defibrillator) in place -stable in place      DVT prophylaxis: Eliquis on hold Code Status: Full Family Communication: None at bedside Disposition:   Status is: Inpatient  Remains inpatient  appropriate because:IV treatments appropriate due to intensity of illness or inability to take PO  Dispo: The patient is from: Home              Anticipated d/c is to: Home              Patient currently is not medically stable to d/c.   Difficult to place patient No       Consultants:  Cardiology CHF   Subjective:  Denies any complaints today, no bowel movements overnight yet  Objective: Vitals:   06/30/21 0440 06/30/21 0751 06/30/21 1100 06/30/21 1255  BP: 120/68 111/65  (!) 91/55  Pulse: 67 74  68  Resp: '18 18  16  '$ Temp: 97.9 F (36.6 C) 98.1 F (36.7 C)  98.3 F (36.8 C)  TempSrc: Oral Oral  Oral  SpO2: 96% 96%  97%  Weight:   (!) 151.8 kg   Height:   '6\' 2"'$  (1.88 m)     Intake/Output Summary (Last 24 hours) at 06/30/2021 1400 Last data filed at 06/30/2021 [id number] Gross per 24 hour  Intake 927.5 ml  Output 350 ml  Net 577.5 ml   Filed Weights   06/30/21 1100  Weight: (!) 151.8 kg    Examination:  General exam: Appears calm and comfortable  Respiratory system: Clear to auscultation. Respiratory effort normal. Cardiovascular system: S1 & S2 heard, RRR. No JVD, murmurs, rubs, gallops or clicks. No pedal edema. Gastrointestinal system: Abdomen is nondistended, soft and nontender. No organomegaly or masses felt. Normal bowel sounds heard. Central nervous system: Alert and oriented. No focal neurological deficits. Extremities: Symmetric 5 x 5 power. Skin: No rashes, lesions or ulcers Psychiatry: Judgement and insight appear normal. Mood & affect appropriate.     Data Reviewed: I have personally reviewed following labs and imaging studies  CBC: Recent Labs  Lab 06/29/21 0240 06/29/21 0250 06/29/21 1000 06/30/21 0113  WBC 8.3  --  8.2 7.8  NEUTROABS 6.3  --   --   --   HGB 4.7* 6.1* 6.5* 7.0*  HCT 17.8* 18.0* 23.8* 25.1*  MCV 75.4*  --  77.8* 78.9*  PLT 390  --  412* [id number]    Basic Metabolic Panel: Recent Labs  Lab 06/29/21 0240 06/29/21 0250  06/30/21 0113  NA 134* 136 134*  K 4.1 4.2 4.6  CL 104 103 102  CO2 22  --  22  GLUCOSE 227* 230* 177*  BUN 32* 31* 26*  CREATININE 1.48* 1.60* 1.40*  CALCIUM 8.2*  --  8.6*    GFR: Estimated Creatinine Clearance: 86.2 mL/min (A) (by C-G formula based on SCr of 1.4 mg/dL (H)).  Liver Function Tests: Recent Labs  Lab 06/29/21 0240 06/30/21 0113  AST 18 17  ALT 16 17  ALKPHOS 56 57  BILITOT 0.5 0.6  PROT 6.3* 6.9  ALBUMIN 2.7* 2.9*    CBG: Recent Labs  Lab 06/29/21 1217 06/29/21 1749 06/29/21 2214 06/30/21 0751 06/30/21 1253  GLUCAP 149* 160* 140* 151* 228*     Recent Results (from the past 240 hour(s))  Resp Panel by RT-PCR (Flu A&B, Covid) Nasopharyngeal Swab     Status: None   Collection Time: 06/29/21  2:40 AM   Specimen: Nasopharyngeal Swab; Nasopharyngeal(NP) swabs in vial transport medium  Result Value Ref Range Status   SARS Coronavirus 2 by RT PCR NEGATIVE NEGATIVE Final    Comment: (NOTE) SARS-CoV-2 target nucleic acids are NOT DETECTED.  The SARS-CoV-2 RNA is generally detectable in upper respiratory specimens during the acute phase of infection. The lowest concentration of SARS-CoV-2 viral copies this assay can detect is 138 copies/mL. A negative result does not preclude SARS-Cov-2 infection and should not be used as the sole basis for treatment or other patient management decisions. A negative result may occur with  improper specimen collection/handling, submission of specimen other than nasopharyngeal swab, presence of viral mutation(s) within the areas targeted by this assay, and inadequate number of viral copies(<138 copies/mL). A negative result must be combined with clinical observations, patient history, and epidemiological information. The expected result is Negative.  Fact Sheet for Patients:  EntrepreneurPulse.com.au  Fact Sheet for Healthcare Providers:  IncredibleEmployment.be  This test is no  t yet approved or cleared by the [place] FDA and  has been authorized for detection and/or diagnosis of SARS-CoV-2 by FDA under an Emergency Use Authorization (EUA). This EUA will remain  in effect (meaning this test can be used) for the duration of the COVID-19 declaration under Section 564(b)(1) of the Act, 21 U.S.C.section 360bbb-3(b)(1), unless the authorization is terminated  or revoked sooner.       Influenza A by PCR NEGATIVE NEGATIVE Final   Influenza B by PCR NEGATIVE NEGATIVE Final    Comment: (NOTE) The Xpert Xpress SARS-CoV-2/FLU/RSV plus assay is intended as an aid in the diagnosis of influenza from Nasopharyngeal swab specimens and should not be used as a sole basis for treatment. Nasal washings and aspirates are unacceptable for Xpert Xpress SARS-CoV-2/FLU/RSV testing.  Fact Sheet for Patients: EntrepreneurPulse.com.au  Fact Sheet for Healthcare Providers: IncredibleEmployment.be  This test is not yet approved or cleared by the [place] FDA and has been authorized for detection and/or diagnosis of SARS-CoV-2 by FDA under an Emergency Use Authorization (EUA). This EUA will remain in effect (meaning this test can be used) for the duration of the COVID-19 declaration under Section 564(b)(1) of the Act, 21 U.S.C. section 360bbb-3(b)(1), unless the authorization is terminated or revoked.  Performed at [facility], [place] [address] [place], [place] [address]   Blood culture (routine x 2)     Status: None (Preliminary result)   Collection Time: 06/29/21  3:20 AM   Specimen: BLOOD  Result Value Ref Range Status   Specimen Description BLOOD RIGHT HAND  Final   Special Requests   Final    BOTTLES DRAWN AEROBIC AND ANAEROBIC Blood Culture adequate volume   Culture   Final    NO GROWTH 1 DAY Performed at [facility], [place] [address], [place], [place] [address]    Report Status PENDING  Incomplete  Blood  culture (routine x 2)     Status: None (Preliminary result)   Collection Time: 06/29/21  3:26 AM   Specimen: BLOOD  Result Value Ref Range Status   Specimen Description BLOOD RIGHT ARM  Final   Special Requests   Final    BOTTLES DRAWN AEROBIC AND ANAEROBIC Blood Culture adequate volume   Culture   Final    NO GROWTH 1 DAY Performed at [facility], [place] [address] [place], [place] [address]    Report Status PENDING  Incomplete         Radiology Studies: DG Chest Portable 1 View  Result Date: 06/29/2021 CLINICAL DATA:  Substernal chest pain. EXAM: PORTABLE CHEST 1 VIEW COMPARISON:  Apr 13, 2021 FINDINGS: A stable single lead ventricular pacer is noted. Mild atelectasis and/or infiltrate is seen within the suprahilar region on the left. There is no evidence of a pleural effusion or pneumothorax. The cardiac silhouette is mildly enlarged. The visualized skeletal structures are unremarkable. IMPRESSION: Stable cardiomegaly with mild left suprahilar atelectasis and/or infiltrate. Electronically Signed   By: Virgina Norfolk M.D.   On: 06/29/2021 02:57        Scheduled Meds:  sodium chloride   Intravenous Once   atorvastatin  40 mg Oral q1800   carvedilol  6.25 mg Oral BID WC   dapagliflozin propanediol  10 mg Oral Daily   ferrous sulfate  325 mg Oral QODAY   furosemide  20 mg Intravenous Once   gabapentin  600 mg Oral TID   insulin aspart  0-5 Units Subcutaneous QHS   insulin aspart  0-9 Units Subcutaneous TID WC   insulin glargine-yfgn  20 Units Subcutaneous Daily   multivitamin with minerals  1 tablet Oral Daily   [START ON 07/02/2021] pantoprazole  40 mg Intravenous Q12H   QUEtiapine  450 mg Oral Daily   sacubitril-valsartan  1 tablet Oral BID   sodium chloride flush  3 mL Intravenous Q12H   sodium chloride flush  3 mL Intravenous Q12H   Continuous Infusions:  sodium chloride     sodium chloride     cefTRIAXone (ROCEPHIN)  IV 1 g (06/30/21 0426)   ferumoxytol      pantoprazole 8 mg/hr (06/30/21 0042)     LOS: 1 day      Phillips Climes, MD [facility]   To contact the attending provider between 7A-7P or the covering provider during after hours 7P-7A, please log into the web site [email or web address] and access using universal [place] password for that web site. If you do not have the password, please call the hospital operator.  06/30/2021, 2:00 PM

## 2021-06-30 NOTE — Anesthesia Preprocedure Evaluation (Addendum)
Anesthesia Evaluation  Patient identified by MRN, date of birth, ID band Patient awake    Reviewed: Allergy & Precautions, NPO status , Patient's Chart, lab work & pertinent test results  Airway Mallampati: III  TM Distance: >3 FB Neck ROM: Full    Dental no notable dental hx. (+) Missing, Dental Advisory Given, Poor Dentition,    Pulmonary sleep apnea , former smoker,    Pulmonary exam normal breath sounds clear to auscultation       Cardiovascular hypertension, + CAD, + Cardiac Stents and +CHF  Normal cardiovascular exam+ dysrhythmias Atrial Fibrillation  Rhythm:Regular Rate:Normal  06/30/21 Echo 1. Left ventricular ejection fraction, by estimation, is 50 to 55%. The  left ventricle has low normal function. The left ventricle has no regional  wall motion abnormalities. The left ventricular internal cavity size was  mildly dilated. There is mild  left ventricular hypertrophy. Left ventricular diastolic parameters were  normal.  2. Right ventricular systolic function is normal. The right ventricular  size is mildly enlarged. There is normal pulmonary artery systolic  pressure. The estimated right ventricular systolic pressure is 10.2 mmHg.  3. Left atrial size was mildly dilated.  4. Right atrial size was mildly dilated.  5. The mitral valve is normal in structure. No evidence of mitral valve  regurgitation. No evidence of mitral stenosis.  6. The aortic valve is tricuspid. Aortic valve regurgitation is not  visualized. No aortic stenosis is present. Aortic valve mean gradient  measures 9.0 mmHg.  7. Aortic dilatation noted. There is mild dilatation of the ascending  aorta, measuring 40 mm.  8. The inferior vena cava is dilated in size with >50% respiratory  variability, suggesting right atrial pressure of 8 mmHg.    Neuro/Psych    GI/Hepatic   Endo/Other  diabetes, Type 2  Renal/GU      Musculoskeletal    Abdominal (+) + obese (BMI 43),   Peds  Hematology  (+) anemia , Lab Results      Component                Value               Date                      WBC                      8.5                 06/30/2021                HGB                      6.6 (LL)            06/30/2021                HCT                      24.0 (L)            06/30/2021                MCV                      78.7 (L)            06/30/2021  PLT                      382                 06/30/2021              Anesthesia Other Findings   Reproductive/Obstetrics                            Anesthesia Physical Anesthesia Plan  ASA: 4  Anesthesia Plan: MAC   Post-op Pain Management:    Induction:   PONV Risk Score and Plan: Treatment may vary due to age or medical condition  Airway Management Planned: Natural Airway and Nasal Cannula  Additional Equipment: None  Intra-op Plan:   Post-operative Plan:   Informed Consent: I have reviewed the patients History and Physical, chart, labs and discussed the procedure including the risks, benefits and alternatives for the proposed anesthesia with the patient or authorized representative who has indicated his/her understanding and acceptance.     Dental advisory given  Plan Discussed with:   Anesthesia Plan Comments:        Anesthesia Quick Evaluation

## 2021-06-30 NOTE — Progress Notes (Signed)
Placed patient on CPAP for the night via auto-mode with oxygen set at 2lpm  

## 2021-07-01 ENCOUNTER — Telehealth: Payer: Self-pay | Admitting: Pharmacy Technician

## 2021-07-01 ENCOUNTER — Encounter (HOSPITAL_COMMUNITY): Payer: Self-pay | Admitting: Internal Medicine

## 2021-07-01 ENCOUNTER — Inpatient Hospital Stay (HOSPITAL_COMMUNITY): Payer: Medicare (Managed Care) | Admitting: Anesthesiology

## 2021-07-01 ENCOUNTER — Encounter (HOSPITAL_COMMUNITY)
Admission: EM | Disposition: A | Discharge status: Home / Self Care | Payer: Self-pay | Source: Home / Self Care | Attending: Internal Medicine

## 2021-07-01 ENCOUNTER — Other Ambulatory Visit: Payer: Self-pay | Admitting: Internal Medicine

## 2021-07-01 DIAGNOSIS — K921 Melena: Secondary | ICD-10-CM

## 2021-07-01 DIAGNOSIS — I5032 Chronic diastolic (congestive) heart failure: Secondary | ICD-10-CM

## 2021-07-01 DIAGNOSIS — K5520 Angiodysplasia of colon without hemorrhage: Secondary | ICD-10-CM | POA: Diagnosis not present

## 2021-07-01 DIAGNOSIS — K922 Gastrointestinal hemorrhage, unspecified: Secondary | ICD-10-CM | POA: Diagnosis not present

## 2021-07-01 DIAGNOSIS — K2970 Gastritis, unspecified, without bleeding: Secondary | ICD-10-CM | POA: Diagnosis not present

## 2021-07-01 DIAGNOSIS — D62 Acute posthemorrhagic anemia: Secondary | ICD-10-CM | POA: Diagnosis not present

## 2021-07-01 DIAGNOSIS — K31819 Angiodysplasia of stomach and duodenum without bleeding: Secondary | ICD-10-CM | POA: Diagnosis not present

## 2021-07-01 DIAGNOSIS — I5022 Chronic systolic (congestive) heart failure: Secondary | ICD-10-CM

## 2021-07-01 DIAGNOSIS — D500 Iron deficiency anemia secondary to blood loss (chronic): Secondary | ICD-10-CM

## 2021-07-01 HISTORY — PX: HOT HEMOSTASIS: SHX5433

## 2021-07-01 HISTORY — PX: ENTEROSCOPY: SHX5533

## 2021-07-01 HISTORY — PX: HEMOSTASIS CLIP PLACEMENT: SHX6857

## 2021-07-01 LAB — CBC
HCT: 27.8 % — ABNORMAL LOW (ref 39.0–52.0)
HCT: 28.9 % — ABNORMAL LOW (ref 39.0–52.0)
Hemoglobin: 8.1 g/dL — ABNORMAL LOW (ref 13.0–17.0)
Hemoglobin: 8.5 g/dL — ABNORMAL LOW (ref 13.0–17.0)
MCH: 23.2 pg — ABNORMAL LOW (ref 26.0–34.0)
MCH: 23.2 pg — ABNORMAL LOW (ref 26.0–34.0)
MCHC: 29.1 g/dL — ABNORMAL LOW (ref 30.0–36.0)
MCHC: 29.4 g/dL — ABNORMAL LOW (ref 30.0–36.0)
MCV: 79.7 fL — ABNORMAL LOW (ref 80.0–100.0)
MCV: 78.7 fL — ABNORMAL LOW (ref 80.0–100.0)
Platelets: 355 10*3/uL (ref 150–400)
Platelets: 385 10*3/uL (ref 150–400)
RBC: 3.49 MIL/uL — ABNORMAL LOW (ref 4.22–5.81)
RBC: 3.67 MIL/uL — ABNORMAL LOW (ref 4.22–5.81)
RDW: 19.3 % — ABNORMAL HIGH (ref 11.5–15.5)
RDW: 19.4 % — ABNORMAL HIGH (ref 11.5–15.5)
WBC: 9.3 10*3/uL (ref 4.0–10.5)
WBC: 12.1 10*3/uL — ABNORMAL HIGH (ref 4.0–10.5)
nRBC: 0.5 % — ABNORMAL HIGH (ref 0.0–0.2)
nRBC: 0.2 % (ref 0.0–0.2)

## 2021-07-01 LAB — TYPE AND SCREEN
ABO/RH(D): B POS
Antibody Screen: NEGATIVE
Unit division: 0
Unit division: 0
Unit division: 0
Unit division: 0
Unit division: 0
Unit division: 0

## 2021-07-01 LAB — BPAM RBC
Blood Product Expiration Date: 202208192359
Blood Product Expiration Date: 202208222359
Blood Product Expiration Date: 202208242359
Blood Product Expiration Date: 202208252359
Blood Product Expiration Date: 202208252359
Blood Product Expiration Date: 202208252359
ISSUE DATE / TIME: 202208030435
ISSUE DATE / TIME: 202208030529
ISSUE DATE / TIME: 202208031633
ISSUE DATE / TIME: 202208032036
ISSUE DATE / TIME: 202208041653
ISSUE DATE / TIME: 202208042235
Unit Type and Rh: 7300
Unit Type and Rh: 7300
Unit Type and Rh: 7300
Unit Type and Rh: 7300
Unit Type and Rh: 7300
Unit Type and Rh: 7300

## 2021-07-01 LAB — BASIC METABOLIC PANEL
Anion gap: 7 (ref 5–15)
BUN: 22 mg/dL (ref 8–23)
CO2: 28 mmol/L (ref 22–32)
Calcium: 8.6 mg/dL — ABNORMAL LOW (ref 8.9–10.3)
Chloride: 102 mmol/L (ref 98–111)
Creatinine, Ser: 1.39 mg/dL — ABNORMAL HIGH (ref 0.61–1.24)
GFR, Estimated: 58 mL/min — ABNORMAL LOW (ref >60)
Glucose, Bld: 140 mg/dL — ABNORMAL HIGH (ref 70–99)
Potassium: 4.6 mmol/L (ref 3.5–5.1)
Sodium: 137 mmol/L (ref 135–145)

## 2021-07-01 LAB — GLUCOSE, CAPILLARY
Glucose-Capillary: 125 mg/dL — ABNORMAL HIGH (ref 70–99)
Glucose-Capillary: 152 mg/dL — ABNORMAL HIGH (ref 70–99)
Glucose-Capillary: 134 mg/dL — ABNORMAL HIGH (ref 70–99)
Glucose-Capillary: 128 mg/dL — ABNORMAL HIGH (ref 70–99)

## 2021-07-01 SURGERY — ENTEROSCOPY
Anesthesia: Monitor Anesthesia Care

## 2021-07-01 MED ORDER — PHENYLEPHRINE 40 MCG/ML (10ML) SYRINGE FOR IV PUSH (FOR BLOOD PRESSURE SUPPORT)
PREFILLED_SYRINGE | INTRAVENOUS | Status: DC | PRN
  Administered 2021-07-01: 160 ug via INTRAVENOUS
  Administered 2021-07-01 (×2): 120 ug via INTRAVENOUS

## 2021-07-01 MED ORDER — GLUCAGON HCL RDNA (DIAGNOSTIC) 1 MG IJ SOLR
INTRAMUSCULAR | Status: AC
  Filled 2021-07-01: qty 1

## 2021-07-01 MED ORDER — GLUCAGON HCL RDNA (DIAGNOSTIC) 1 MG IJ SOLR
INTRAMUSCULAR | Status: DC | PRN
  Administered 2021-07-01: .5 mg via INTRAVENOUS

## 2021-07-01 MED ORDER — SACUBITRIL-VALSARTAN 24-26 MG PO TABS
1.0000 | ORAL_TABLET | Freq: Two times a day (BID) | ORAL | Status: DC
  Administered 2021-07-01 – 2021-07-02 (×2): 1 via ORAL
  Filled 2021-07-01 (×3): qty 1

## 2021-07-01 MED ORDER — SPIRONOLACTONE 25 MG PO TABS
25.0000 mg | ORAL_TABLET | Freq: Every day | ORAL | Status: DC
  Administered 2021-07-01 – 2021-07-02 (×2): 25 mg via ORAL
  Filled 2021-07-01 (×2): qty 1

## 2021-07-01 MED ORDER — PROPOFOL 500 MG/50ML IV EMUL
INTRAVENOUS | Status: DC | PRN
  Administered 2021-07-01: 100 ug/kg/min via INTRAVENOUS

## 2021-07-01 MED ORDER — POTASSIUM CHLORIDE CRYS ER 20 MEQ PO TBCR
40.0000 meq | EXTENDED_RELEASE_TABLET | Freq: Every day | ORAL | Status: DC
  Administered 2021-07-01 – 2021-07-02 (×2): 40 meq via ORAL
  Filled 2021-07-01 (×2): qty 2

## 2021-07-01 MED ORDER — LACTATED RINGERS IV SOLN: INTRAVENOUS | Status: DC | PRN

## 2021-07-01 MED ORDER — PANTOPRAZOLE SODIUM 40 MG PO TBEC
40.0000 mg | DELAYED_RELEASE_TABLET | Freq: Every day | ORAL | Status: DC
  Administered 2021-07-01 – 2021-07-02 (×2): 40 mg via ORAL
  Filled 2021-07-01 (×2): qty 1

## 2021-07-01 MED ORDER — LACTATED RINGERS IV SOLN: Freq: Once | INTRAVENOUS | Status: AC

## 2021-07-01 MED ORDER — PROPOFOL 10 MG/ML IV BOLUS
INTRAVENOUS | Status: DC | PRN
  Administered 2021-07-01: 20 mg via INTRAVENOUS

## 2021-07-01 MED ORDER — SODIUM CHLORIDE 0.9 % IV SOLN: INTRAVENOUS | Status: DC

## 2021-07-01 MED ORDER — FUROSEMIDE 40 MG PO TABS
60.0000 mg | ORAL_TABLET | Freq: Two times a day (BID) | ORAL | Status: DC
  Administered 2021-07-01 – 2021-07-02 (×2): 60 mg via ORAL
  Filled 2021-07-01 (×2): qty 1

## 2021-07-01 NOTE — TOC Initial Note (Addendum)
Transition of Care (TOC) - Initial/Assessment Note  Heart Failure   Patient Details  Name: [name] MRN: [id number] Date of Birth: 12/07/1960  Transition of Care [facility]) CM/SW Contact:    [place], [place] Phone Number: 07/01/2021, 4:56 PM  Clinical Narrative:                 CSW spoke with the patient at bedside and completed a very brief SDOH with the patient who reported he is not driving and needs transportation. CSW to enroll Mr. Doten in [place] transportation. Mr. Maner reported he doesn't have a primary care provider and would like help setting up an appointment. CSW made a PCP appointment for the soonest available with Dr. Dorna Mai 08/03/21 at 1:50 and information is on the patient's AVS. CSW provided Mr. Burkard with an appointment card for the [facility] and encouraged him to follow up and to attend the appointment and bring his medications and if anything changes to please reach out so that CSW/HV clinic team can provide support.   CSW will continue to follow throughout discharge.    Barriers to Discharge: Continued Medical Work up   Patient Goals and CMS Choice        Expected Discharge Plan and Services   In-house Referral: Clinical Social Work     Living arrangements for the past 2 months: Apartment                                      Prior Living Arrangements/Services Living arrangements for the past 2 months: Apartment   Patient language and need for interpreter reviewed:: Yes        Need for Family Participation in Patient Care: No (Comment) Care giver support system in place?: No (comment)   Criminal Activity/Legal Involvement Pertinent to Current Situation/Hospitalization: No - Comment as needed  Activities of Daily Living      Permission Sought/Granted                  Emotional Assessment Appearance:: Appears stated age Attitude/Demeanor/Rapport: Engaged Affect (typically observed):  Pleasant Orientation: : Oriented to Self, Oriented to Place, Oriented to Situation, Oriented to  Time   Psych Involvement: No (comment)  Admission diagnosis:  GI bleed [K92.2] NSTEMI (non-ST elevated myocardial infarction) ([place]) [I21.4] Severe anemia [D64.9] Gastrointestinal hemorrhage, unspecified gastrointestinal hemorrhage type [K92.2] Patient Active Problem List   Diagnosis Date Noted   [place]ena    Chronic diastolic CHF (congestive heart failure) (HCC)    GI bleed 06/29/2021   Severe anemia 06/29/2021   Adenomatous polyp of ascending colon    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   Acute blood loss anemia    Angiodysplasia of stomach    Chronic anticoagulation    CHF exacerbation ([place]) 06/15/2020   AKI (acute kidney injury) ([place]) 06/15/2020   CKD (chronic kidney disease), stage III ([place]) 06/15/2020   Anemia due to chronic blood loss    Gastric AVM    Angiodysplasia of duodenum with hemorrhage    Acute on chronic systolic (congestive) heart failure ([place]) 04/05/2020   Anemia 04/05/2020   PAF (paroxysmal atrial fibrillation) ([place]) 04/05/2020   OSA on CPAP 04/05/2020   Syncope and collapse 04/05/2020   Type 2 diabetes mellitus with hyperglycemia, with long-term current use of insulin ([place]) 12/03/2019   Diabetic polyneuropathy associated with type 2 diabetes mellitus ([place]) 12/03/2019   Erectile  dysfunction 12/03/2019   CHF (congestive heart failure) ([place]) 10/11/2019   Paranoid schizophrenia, chronic condition ([place]) 01/26/2015   Severe recurrent major depressive disorder with psychotic features ([place]) 01/26/2015   GAD (generalized anxiety disorder) 01/26/2015   OCD (obsessive compulsive disorder) 01/26/2015   Panic disorder without agoraphobia 01/26/2015   Insomnia 01/26/2015   PCP:  Pcp, No Pharmacy:   [facility] [address] [address] Phone: [phone] Fax: [phone]  [facility]  [name], [place] - [address] [place] [address] Phone: [phone] Fax: [place] [id number] - [address] [name] [address] Phone: [phone] Fax: [phone]     Social Determinants of Health (SDOH) Interventions Food Insecurity Interventions: Intervention Not Indicated Housing Interventions: Intervention Not Indicated Transportation Interventions: SC[employer] ([employer]), [facility]  Readmission Risk Interventions No flowsheet data found.  Dyasia Firestine, MSW, [place] Heart Failure Social Worker

## 2021-07-01 NOTE — Progress Notes (Addendum)
Advanced Heart Failure Rounding Note  PCP-Cardiologist: [name], MD  HF clinic: Dr. Aundra Dubin  Subjective:     Hgb up to 8 after total of 6 units pRBCs.   Creatinine 1.60 > 1.40 > 1.39  Feeling well. No chest pain or dyspnea. Ready to go home.   Echo 08/04: EF 50-55%, mildly dilated LV, mild LVH, RV okay, dilated IVC with estimated RA pressure of 8  Objective:   Weight Range: (!) 150.9 kg Body mass index is 42.72 kg/m.   Vital Signs:   Temp:  [98.1 F (36.7 C)-99.4 F (37.4 C)] 99.4 F (37.4 C) (08/05 0902) Pulse Rate:  [68-88] 79 (08/05 0902) Resp:  [15-19] 15 (08/05 0902) BP: (91-150)/(55-77) 142/62 (08/05 0902) SpO2:  [92 %-100 %] 98 % (08/05 0902) Weight:  [150.9 kg-151.8 kg] 150.9 kg (08/05 0500)    Weight change: Filed Weights   06/30/21 1100 07/01/21 0500  Weight: (!) 151.8 kg (!) 150.9 kg    Intake/Output:   Intake/Output Summary (Last 24 hours) at 07/01/2021 0905 Last data filed at 07/01/2021 0450 Gross per 24 hour  Intake 997.25 ml  Output 3425 ml  Net -2427.75 ml      Physical Exam    General:  Well appearing. No resp difficulty. HEENT: Normal Neck: Supple. JVP 8 cm sitting up in chair (difficult to assess due to thick neck). Carotids 2+ bilat; no bruits. No lymphadenopathy or thyromegaly appreciated. Cor: PMI nondisplaced. Regular rate & rhythm. No rubs, gallops or murmurs. Lungs: CTA  Abdomen: Soft, nontender, nondistended. No hepatosplenomegaly. No bruits or masses. Good bowel sounds. Extremities: No cyanosis, clubbing, rash, edema Neuro: Alert & orientedx3, cranial nerves grossly intact. moves all 4 extremities w/o difficulty. Affect pleasant   Telemetry   NSR 70s (personally reviewed)  EKG    No new ECG for review  Labs    CBC Recent Labs    06/29/21 0240 06/29/21 0250 06/30/21 1431 07/01/21 0406  WBC 8.3   < > 8.5 9.3  NEUTROABS 6.3  --   --   --   HGB 4.7*   < > 6.6* 8.1*  HCT 17.8*   < > 24.0* 27.8*  MCV 75.4*    < > 78.7* 79.7*  PLT 390   < > 382 355   < > = values in this interval not displayed.   Basic Metabolic Panel Recent Labs    06/30/21 0113 07/01/21 0406  NA 134* 137  K 4.6 4.6  CL 102 102  CO2 22 28  GLUCOSE 177* 140*  BUN 26* 22  CREATININE 1.40* 1.39*  CALCIUM 8.6* 8.6*   Liver Function Tests Recent Labs    06/29/21 0240 06/30/21 0113  AST 18 17  ALT 16 17  ALKPHOS 56 57  BILITOT 0.5 0.6  PROT 6.3* 6.9  ALBUMIN 2.7* 2.9*   No results for input(s): LIPASE, AMYLASE in the last 72 hours. Cardiac Enzymes No results for input(s): CKTOTAL, CKMB, CKMBINDEX, TROPONINI in the last 72 hours.  BNP: BNP (last 3 results) Recent Labs    05/11/21 1614 06/29/21 0240 06/29/21 1654  BNP 110.8* 246.0* 349.4*    ProBNP (last 3 results) Recent Labs    02/23/21 1315  PROBNP 249*     D-Dimer No results for input(s): DDIMER in the last 72 hours. Hemoglobin A1C Recent Labs    06/29/21 1000  HGBA1C 6.5*   Fasting Lipid Panel No results for input(s): CHOL, HDL, LDLCALC, TRIG, CHOLHDL, LDLDIRECT in the  last 72 hours. Thyroid Function Tests No results for input(s): TSH, T4TOTAL, T3FREE, THYROIDAB in the last 72 hours.  Invalid input(s): FREET3  Other results:   Imaging    ECHOCARDIOGRAM COMPLETE  Result Date: 06/30/2021    ECHOCARDIOGRAM REPORT   Patient Name:   [name] Date of Exam: 06/30/2021 Medical Rec #:  [id number]            Height:       74.0 in Accession #:    [id number]           Weight:       329.0 lb Date of Birth:  03/13/1961            BSA:          2.685 m Patient Age:    60 years             BP:           111/65 mmHg Patient Gender: M                    HR:           77 bpm. Exam Location:  Inpatient Procedure: 2D Echo, Cardiac Doppler, Color Doppler and Intracardiac            Opacification Agent Indications:    Congestive Heart Failure I50.9                 Elevated Troponin  History:        Patient has prior history of Echocardiogram  examinations, most                 recent 04/05/2020. CHF, CAD; Risk Factors:Hypertension, Diabetes,                 Sleep Apnea and Former Smoker.  Sonographer:    [name] RDCS Referring Phys: [id number] LINDSAY NICOLE [place]  1. Left ventricular ejection fraction, by estimation, is 50 to 55%. The left ventricle has low normal function. The left ventricle has no regional wall motion abnormalities. The left ventricular internal cavity size was mildly dilated. There is mild left ventricular hypertrophy. Left ventricular diastolic parameters were normal.  2. Right ventricular systolic function is normal. The right ventricular size is mildly enlarged. There is normal pulmonary artery systolic pressure. The estimated right ventricular systolic pressure is [id number] mmHg.  3. Left atrial size was mildly dilated.  4. Right atrial size was mildly dilated.  5. The mitral valve is normal in structure. No evidence of mitral valve regurgitation. No evidence of mitral stenosis.  6. The aortic valve is tricuspid. Aortic valve regurgitation is not visualized. No aortic stenosis is present. Aortic valve mean gradient measures 9.0 mmHg.  7. Aortic dilatation noted. There is mild dilatation of the ascending aorta, measuring 40 mm.  8. The inferior vena cava is dilated in size with >50% respiratory variability, suggesting right atrial pressure of 8 mmHg. FINDINGS  Left Ventricle: Left ventricular ejection fraction, by estimation, is 50 to 55%. The left ventricle has low normal function. The left ventricle has no regional wall motion abnormalities. Definity contrast agent was given IV to delineate the left ventricular endocardial borders. The left ventricular internal cavity size was mildly dilated. There is mild left ventricular hypertrophy. Left ventricular diastolic parameters were normal. Right Ventricle: The right ventricular size is mildly enlarged. No increase in right ventricular wall thickness. Right ventricular systolic  function is normal. There is normal pulmonary artery  systolic pressure. The tricuspid regurgitant velocity is 2.46  m/s, and with an assumed right atrial pressure of 8 mmHg, the estimated right ventricular systolic pressure is [id number] mmHg. Left Atrium: Left atrial size was mildly dilated. Right Atrium: Right atrial size was mildly dilated. Pericardium: There is no evidence of pericardial effusion. Mitral Valve: The mitral valve is normal in structure. No evidence of mitral valve regurgitation. No evidence of mitral valve stenosis. Tricuspid Valve: The tricuspid valve is normal in structure. Tricuspid valve regurgitation is trivial. Aortic Valve: The aortic valve is tricuspid. Aortic valve regurgitation is not visualized. No aortic stenosis is present. Aortic valve mean gradient measures 9.0 mmHg. Aortic valve peak gradient measures 16.3 mmHg. Aortic valve area, by VTI measures 2.59  cm. Pulmonic Valve: The pulmonic valve was normal in structure. Pulmonic valve regurgitation is not visualized. Aorta: The aortic root is normal in size and structure and aortic dilatation noted. There is mild dilatation of the ascending aorta, measuring 40 mm. Venous: The inferior vena cava is dilated in size with greater than 50% respiratory variability, suggesting right atrial pressure of 8 mmHg. IAS/Shunts: No atrial level shunt detected by color flow Doppler. Additional Comments: A device lead is visualized in the right ventricle.  LEFT VENTRICLE PLAX 2D LVIDd:         6.50 cm      Diastology LVIDs:         4.60 cm      LV e' medial:    9.14 cm/s LV PW:         1.10 cm      LV E/e' medial:  15.3 LV IVS:        1.10 cm      LV e' lateral:   11.60 cm/s LVOT diam:     2.40 cm      LV E/e' lateral: 12.1 LV SV:         103 LV SV Index:   38 LVOT Area:     4.52 cm  LV Volumes (MOD) LV vol d, MOD A2C: 343.0 ml LV vol d, MOD A4C: 312.0 ml LV vol s, MOD A2C: 182.0 ml LV vol s, MOD A4C: 160.0 ml LV SV MOD A2C:     161.0 ml LV SV MOD A4C:      312.0 ml LV SV MOD BP:      161.6 ml RIGHT VENTRICLE RV S prime:     15.60 cm/s TAPSE (M-mode): 3.0 cm LEFT ATRIUM              Index       RIGHT ATRIUM           Index LA diam:        5.30 cm  1.97 cm/m  RA Area:     36.00 cm LA Vol (A2C):   87.3 ml  32.51 ml/m RA Volume:   143.00 ml 53.25 ml/m LA Vol (A4C):   105.0 ml 39.10 ml/m LA Biplane Vol: 98.2 ml  36.57 ml/m  AORTIC VALVE AV Area (Vmax):    2.44 cm AV Area (Vmean):   2.50 cm AV Area (VTI):     2.59 cm AV Vmax:           202.00 cm/s AV Vmean:          141.000 cm/s AV VTI:            0.397 m AV Peak Grad:      16.3 mmHg AV Mean Grad:  9.0 mmHg LVOT Vmax:         109.00 cm/s LVOT Vmean:        77.900 cm/s LVOT VTI:          0.227 m LVOT/AV VTI ratio: 0.57  AORTA Ao Root diam: 3.40 cm Ao Asc diam:  4.00 cm Ao Desc diam: 2.90 cm MITRAL VALVE                TRICUSPID VALVE MV Area (PHT): 4.44 cm     TR Peak grad:   24.2 mmHg MV Decel Time: 171 msec     TR Vmax:        246.00 cm/s MV E velocity: 140.00 cm/s MV A velocity: 105.00 cm/s  SHUNTS MV E/A ratio:  1.33         Systemic VTI:  0.23 m                             Systemic Diam: 2.40 cm Loralie Champagne MD Electronically signed by Loralie Champagne MD Signature Date/Time: 06/30/2021/2:19:43 PM    Final      Medications:     Scheduled Medications:  [id number] Hold] sodium chloride   Intravenous Once   [MAR Hold] atorvastatin  40 mg Oral q1800   [MAR Hold] carvedilol  6.25 mg Oral BID WC   [MAR Hold] dapagliflozin propanediol  10 mg Oral Daily   [MAR Hold] ferrous sulfate  325 mg Oral QODAY   [MAR Hold] furosemide  20 mg Intravenous Once   [MAR Hold] gabapentin  600 mg Oral TID   [MAR Hold] insulin aspart  0-5 Units Subcutaneous QHS   [MAR Hold] insulin aspart  0-9 Units Subcutaneous TID WC   [MAR Hold] insulin glargine-yfgn  20 Units Subcutaneous Daily   [MAR Hold] multivitamin with minerals  1 tablet Oral Daily   [MAR Hold] pantoprazole  40 mg Intravenous Q12H   [MAR Hold] QUEtiapine  450 mg  Oral Daily   [MAR Hold] sacubitril-valsartan  1 tablet Oral BID   [MAR Hold] sodium chloride flush  3 mL Intravenous Q12H   [MAR Hold] sodium chloride flush  3 mL Intravenous Q12H    Infusions:  [MAR Hold] sodium chloride     [MAR Hold] sodium chloride     sodium chloride     [MAR Hold] cefTRIAXone (ROCEPHIN)  IV 1 g (07/01/21 0504)   [MAR Hold] ferumoxytol 510 mg (06/30/21 1520)   pantoprazole 8 mg/hr (07/01/21 0631)    PRN Medications: [MAR Hold] sodium chloride, [MAR Hold] acetaminophen **OR** [MAR Hold] acetaminophen, [MAR Hold] bisacodyl, [MAR Hold] hydrALAZINE, [MAR Hold]  HYDROmorphone (DILAUDID) injection, [MAR Hold] ipratropium, [MAR Hold] levalbuterol, [MAR Hold] naphazoline-glycerin, [MAR Hold] ondansetron **OR** [MAR Hold] ondansetron (ZOFRAN) IV, [MAR Hold] oxyCODONE, [MAR Hold] senna-docusate, [MAR Hold] sodium chloride flush    Assessment/Plan   Chest pain/elevated troponin with known CAD: -CP in setting of severe anemia. No recurrence after transfusions. -HS troponin [id number]. -Troponin elevation likely secondary to demand ischemia from severe anemia.  -Does have known CAD. Last cath 11/20220 with occluded OM2 stent, 90% small OM1, diffuse disease RCA treated medically.  Slight ST depression in inferolateral leads in territory of known disease. -Would only consider LHC if recurrent chest pain after treating anemia. -On statin. No aspirin d/t GI bleeding.   2. 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 Received IV iron. -Continue PPI -Avoid NSAIDs in future - reports regular use of Excedrin PTA -Refrain from alcohol   3. Paroxysmal atrial  fibrillation: -Maintaining sinus rhythm -CHADS2-VASc score = 4 (CAD, CHF, HTN, DM), had been on anticoagulation with Eliquis. Holding d/t GI bleed, can potentially restart on Sunday 08/07. -D/t recurrent GI bleeding, will arrange referral to discuss left atrial appendage occlusion with watchman device after discharge. -If not a candidate for watchman, will need to weigh risks/benefits of long-term anticoagulation. Discussed importance of avoiding NSAIDs and alcohol in the future.   4. Chronic systolic CHF with recovery in LV function:  -Echo in 11/20 with EF 25-30%, echo 5/21 with EF 30% with mildly decreased RV systolic function. Biotronik ICD.  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   -Has cut back on alcohol consumption significantly, should avoid altogether -Repeat echo with improvement in LVEF to 50-55% -Weight 332#, 323# at clinic visit in July when compensated. NYHA class IV symptoms on admit but likely d/t anemia. BNP slightly than in June. -Okay with 20 mg IV lasix after each transfusion.  -Restart furosemide at 60 mg BID today (home dose) -Continue Coreg 6.25 mg bid.   -Will stop Entresto d/t recovery in LV function. -Restart spiro at 25 mg daily (on 12.5 mg at home) d/t probable diastolic CHF and elevated BP, continue Farxiga 10 mg daily   5. HTN: -BP more elevated today. See above.   6. OSA:  -CPAP machine dated and does not work. Updated sleep study ordered at last HF visit   7. AKI on CKD: -Likely d/t volume depletion in setting of severe anemia -Cr 1.60 > 1.40 > 1.39  (baseline 0.9-1.2)   F/u in HF clinic scheduled.   Length of Stay: 2  FINCH, LINDSAY N, PA-C  07/01/2021, 9:05 AM  Advanced Heart Failure Team Pager [phone] (M-F; 7a - 5p)  Please contact [facility] for night-coverage after hours (5p -7a ) and weekends on [email or web address]   Patient seen with PA, agree with the above note.   S/p SB enteroscopy today with APC/clip to duodenal AVMs.   Hgb 8.1.  He remains off apixaban.  BP elevated.  General: NAD, obese.  Neck: No JVD, no thyromegaly or thyroid nodule.  Lungs: Clear to auscultation bilaterally with normal respiratory effort. CV: Nondisplaced PMI.  Heart regular S1/S2, no S3/S4, no murmur.  Trace ankle edema.  Abdomen: Soft, nontender, no hepatosplenomegaly, no distention.  Skin: Intact without lesions or rashes.  Neurologic: Alert and oriented x 3.  Psych: Normal affect. Extremities: No clubbing or cyanosis.  HEENT: Normal.   Echo this admissions shows that EF is up to 50-55%, possibly due to cutting back on ETOH.  Agree with restarting Lasix 60 mg bid and spironolactone today.  BP is elevated, would go ahead and also restart his home Entresto 24/26 bid.   Would hold apixaban at least tomorrow, possibly restart on Sunday.  Would refer him as outpatient for Watchman workup though his body habitus may be prohibitive. Has had multiple GI bleeds. Avoid NSAIDs and ETOH.   Loralie Champagne 07/01/2021 4:51 PM

## 2021-07-01 NOTE — Op Note (Signed)
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 Providers:                Lajuan Lines. Hilarie Fredrickson, MD, Doristine Johns, RN, Ladona Ridgel, Technician Referring MD:             [facility] Medicines:                Monitored Anesthesia Care Complications:            No immediate complications. Estimated Blood Loss:     Estimated blood loss was minimal. Procedure:                Pre-Anesthesia Assessment:                           - Prior to the procedure, a History and Physical                            was performed, and patient medications and                            allergies were reviewed. The patient's tolerance of                            previous anesthesia was also reviewed. The risks                            and benefits of the procedure and the sedation                            options and risks were discussed with the patient.                            All questions were answered, and informed consent                            was obtained. Prior Anticoagulants: The patient has  taken Eliquis (apixaban), last dose was 3 days                            prior to procedure. ASA Grade Assessment: III - A                            patient with severe systemic disease. After                            reviewing the risks and benefits, the patient was                            deemed in satisfactory condition to  undergo the                            procedure.                           After obtaining informed consent, the endoscope was                            passed under direct vision. Throughout the                            procedure, the patient's blood pressure, pulse, and                            oxygen saturations were monitored continuously. The                            [id number] Olympus colonoscope was                            introduced through the mouth and advanced to the                            proximal jejunum. The small bowel enteroscopy was                            accomplished without difficulty. The patient                            tolerated the procedure well. Scope In: Scope Out: Findings:      The examined esophagus was normal.      Mild inflammation characterized by erythema and linear erosions was       found in the gastric body and in the gastric antrum. .      Three angioectasias with no bleeding were found in the third portion of       the duodenum (2) and in the fourth portion (1) of the duodenum.       Fulguration to ablate the lesion to prevent bleeding by argon plasma at       0.5 liters/minute and 20 watts was successful. To prevent bleeding       post-maneuver and due to mild oozing, one  hemostatic clip was       successfully placed on 1 of the treated spots in the third portion of       duodenum (MR conditional). There was no bleeding at the end of the       maneuver.      There was no evidence of significant pathology in the proximal jejunum. Impression:               - Normal esophagus.                           - Mild gastritis. Not source of bleeding.                           - Three non-bleeding angioectasias in the duodenum.                            Treated with argon plasma coagulation (APC). Clip                            (MR conditional) was placed x 1.                           - The examined portion of the jejunum was  normal.                           - No specimens collected. Moderate Sedation:      N/A Recommendation:           - Return patient to hospital ward for ongoing care.                           - Resume previous diet.                           - Can resume Eliquis tomorrow.                           - Would use daily PPI.                           - No NSAIDs.                           - Patient will need blood counts watched closely as                            an outpatient given history and chronic                            anticoagulation.                           - Dose #2 of IV iron (Feraheme) in 1 week as an                            outpatient. I will order at the [facility]  [place] for discharge from GI perspective per                            primary team.                           - GI will sign off, call if questions. Procedure Code(s):        --- Professional ---                           [phone], Small intestinal endoscopy, enteroscopy                            beyond second portion of duodenum, not including                            ileum; with control of bleeding (eg, injection,                            bipolar cautery, unipolar cautery, laser, heater                            probe, stapler, plasma coagulator) Diagnosis Code(s):        --- Professional ---                           K29.70, Gastritis, unspecified, without bleeding                           K31.819, Angiodysplasia of stomach and duodenum                            without bleeding                           K92.1, Melena (includes Hematochezia) CPT copyright 2019 [employer]. All rights reserved. The codes documented in this report are preliminary and upon coder review may  be revised to meet current compliance requirements. Jerene Bears, MD 07/01/2021 10:50:59 AM This report has been signed electronically. Number of  Addenda: 0

## 2021-07-01 NOTE — Interval H&P Note (Signed)
History and Physical Interval Note: For small bowel enteroscopy to evaluate recurrent IDA and heme + stools in the setting of chronic anticoag in pt with known gastric and small bowel angioectasias. The nature of the procedure, as well as the risks, benefits, and alternatives were carefully and thoroughly reviewed with the patient. Ample time for discussion and questions allowed. The patient understood, was satisfied, and agreed to proceed.   CBC Latest Ref Rng & Units 07/01/2021 06/30/2021 06/30/2021  WBC 4.0 - 10.5 K/uL 9.3 8.5 7.8  Hemoglobin 13.0 - 17.0 g/dL 8.1(L) 6.6(LL) 7.0(L)  Hematocrit 39.0 - 52.0 % 27.8(L) 24.0(L) 25.1(L)  Platelets 150 - 400 K/uL 355 382 398   Lab Results  Component Value Date   INR 1.0 06/30/2021   INR 0.88 09/23/2011     07/01/2021 9:07 AM  [name]  has presented today for surgery, with the diagnosis of Iron deficiency anemia, small bowel angioectasias.  The various methods of treatment have been discussed with the patient and family. After consideration of risks, benefits and other options for treatment, the patient has consented to  Procedure(s): ENTEROSCOPY (N/A) as a surgical intervention.  The patient's history has been reviewed, patient examined, no change in status, stable for surgery.  I have reviewed the patient's chart and labs.  Questions were answered to the patient's satisfaction.     Lajuan Lines Jaleea Alesi

## 2021-07-01 NOTE — Transfer of Care (Signed)
Immediate Anesthesia Transfer of Care Note  Patient: [name]  Procedure(s) Performed: ENTEROSCOPY HOT HEMOSTASIS (ARGON PLASMA COAGULATION/BICAP) HEMOSTASIS CLIP PLACEMENT  Patient Location: Endoscopy Unit  Anesthesia Type:MAC  Level of Consciousness: awake and alert   Airway & Oxygen Therapy: Patient Spontanous Breathing and Patient connected to nasal cannula oxygen  Post-op Assessment: Report given to RN and Post -op Vital signs reviewed and stable  Post vital signs: Reviewed and stable  Last Vitals:  Vitals Value Taken Time  BP 138/60 07/01/21 1037  Temp    Pulse 77 07/01/21 1037  Resp 11 07/01/21 1037  SpO2 92 % 07/01/21 1037  Vitals shown include unvalidated device data.  Last Pain:  Vitals:   07/01/21 0902  TempSrc: Oral  PainSc: 3          Complications: No notable events documented.

## 2021-07-01 NOTE — Progress Notes (Signed)
PROGRESS NOTE    [name]  [name] DOB: 27-Dec-1960 DOA: 06/29/2021 PCP: [name]    Chief Complaint  Patient presents with   Chest Pain    EMS reports pt to have CP onset 2 days ago, intermittent in nature but increasing in severity tonight.  Pt took excedrin x3 PTA at home Nitro x2 per EMS en route with relief of sternal CP.  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 from gastric and small bowel angioectasias, history of multiple colon polyps, CAD and atrial fibrillation on Eliquis, chronic CHF admitted with recurrent profound symptomatic iron deficiency anemia   Assessment & Plan:   Principal Problem:   GI bleed Active Problems:   Paranoid schizophrenia, chronic condition (HCC)   Severe recurrent major depressive disorder with psychotic features (HCC)   GAD (generalized anxiety disorder)   OCD (obsessive compulsive disorder)   CHF (congestive heart failure) (HCC)   Type 2 diabetes mellitus with hyperglycemia, with long-term current use of insulin (HCC)   Acute on chronic systolic (congestive) heart failure (HCC)   PAF (paroxysmal atrial fibrillation) (HCC)   OSA on CPAP   Anemia due to chronic blood loss   CKD (chronic kidney disease), stage III (HCC)   Acute blood loss anemia   Chronic anticoagulation   ICD (implantable cardioverter-defibrillator) in place   Severe anemia   Melena   Acute blood Loss anemia due to GI bleed -Patient with known history of recurrent GI bleed in the past, on Eliquis, he just recently had 2 endoscopies in May, work-up significant for intestinal angiectasias -Her H&H closely, will repeat CBC currently and transfuse for target hemoglobin of 8 -Transfused 3 units 8/3. -Transfuse another 2 units 8/4 -Recieved IV iron 8/4 -We will monitor overnight, will check CBC in a.m., and if hemoglobin remains stable he can be discharged in a.m. with  Eliquis. -Post small bowel enteroscopy by Dr. Caffie Long 8/5 significant for mild gastritis, 3 nonbleeding angiectasia's in the duodenum treated with argon plasma coagulation APC, clip was placed x1, jejunum was normal. -Recommendation per GI: 1-can resume Eliquis tomorrow. 2-Long NSAIDs. 3-Will need daily PPI 4-monitor H&H closely 5-To receive second dose of IV iron Feraheme in 1 week as an outpatient, GI will arrange at infusion center   Chest pain, with elevated troponin with known CAD -This is most likely demand ischemia in the setting of severe anemia - HS troponin [id number]. -Cardiology input greatly appreciated, they would only consider LHC if recurrent chest pain after treating his anemia. -Continue with statin, Long aspirin due to GI bleed    Paranoid schizophrenia, chronic condition ([name]) -currently stable, continue with home medications    severe recurrent major depressive disorder with psychotic features ([name])  -continue home medication including high-dose Seroquel    GAD (generalized anxiety disorder) -stable, continue anxiolytics     OCD (obsessive compulsive disorder) -stable resuming home meds    Chronic dCHF (congestive heart failure) (HCC)  -Long signs of exacerbation, monitoring for volume overload with PRBC transfusion -we will pursue with IV Lasix today, resuming oral Lasix in a.m. -Holding home medication of spironolactone, continue Entresto -We will monitor daily weight, I's and O's -Last echocardiogram reviewed 04/15/2020, ejection fraction 30%, with grade 2 diastolic dysfunction    Type 2 diabetes mellitus with hyperglycemia, with long-term current use of insulin ([name]) -Withholding home medications of insulin lispro 75/25, Glucotrol, continue home medication  of Lantus at 20 units nightly and insulin sliding scale    PAF (paroxysmal atrial fibrillation) (HCC)  -continue home medication of Coreg, with holding Eliquis  OSA on CPAP  -currently stable, supplemental  O2    CKD (chronic kidney disease), stage III (HCC) -mildly elevated creatinine from baseline , creatinine this morning 1.60, BUN 31 --likely exacerbated by severe anemia -we will monitor closely, avoiding nephrotoxins     Chronic anticoagulation -on Eliquis due to A. fib on hold now   ICD (implantable cardioverter-defibrillator) in place -stable in place      DVT prophylaxis: Eliquis on hold Code Status: Full Family Communication: None at bedside Disposition:   Status is: Inpatient  Remains inpatient appropriate because:IV treatments appropriate due to intensity of illness or inability to take PO  Dispo: The patient is from: Home              Anticipated d/c is to: Home              Patient currently is not medically stable to d/c.  Can DC tomorrow if H&H remained stable   Difficult to place patient Long       Consultants:  Cardiology CHF  Procedures -Post small bowel enteroscopy by Dr. Caffie Long 8/5 significant for mild gastritis, 3 nonbleeding angiectasia's in the duodenum treated with argon plasma coagulation APC, clip was placed x1, jejunum was normal.  Subjective:  Denies any complaints today.  Objective: Vitals:   07/01/21 1037 07/01/21 1050 07/01/21 1057 07/01/21 1205  BP: 138/60 (!) 132/40 (!) 166/84 (!) 166/83  Pulse: 77 80 80 79  Resp: '11 15 18 14  '$ Temp: 98.9 F (37.2 C)   99.6 F (37.6 C)  TempSrc: Axillary   Oral  SpO2: 92% 94% 92% 96%  Weight:      Height:        Intake/Output Summary (Last 24 hours) at 07/01/2021 1358 Last data filed at 07/01/2021 1028 Gross per 24 hour  Intake 1007.25 ml  Output 3425 ml  Net -2417.75 ml   Filed Weights   06/30/21 1100 07/01/21 0500  Weight: (!) 151.8 kg (!) 150.9 kg    Examination:  Awake Alert, Oriented X 3, Long new F.N deficits, Normal affect Symmetrical Chest wall movement, Good air movement bilaterally, CTAB RRR,Long Gallops,Rubs or new Murmurs, Long Parasternal Heave +ve B.Sounds, Abd Soft, Long  tenderness, Long rebound - guarding or rigidity. Long Cyanosis, Clubbing or edema, Long new Rash or bruise      Data Reviewed: I have personally reviewed following labs and imaging studies  CBC: Recent Labs  Lab 06/29/21 0240 06/29/21 0250 06/29/21 1000 06/30/21 0113 06/30/21 1431 07/01/21 0406  WBC 8.3  --  8.2 7.8 8.5 9.3  NEUTROABS 6.3  --   --   --   --   --   HGB 4.7* 6.1* 6.5* 7.0* 6.6* 8.1*  HCT 17.8* 18.0* 23.8* 25.1* 24.0* 27.8*  MCV 75.4*  --  77.8* 78.9* 78.7* 79.7*  PLT 390  --  412* 398 382 [id number]    Basic Metabolic Panel: Recent Labs  Lab 06/29/21 0240 06/29/21 0250 06/30/21 0113 07/01/21 0406  NA 134* 136 134* 137  K 4.1 4.2 4.6 4.6  CL 104 103 102 102  CO2 22  --  22 28  GLUCOSE 227* 230* 177* 140*  BUN 32* 31* 26* 22  CREATININE 1.48* 1.60* 1.40* 1.39*  CALCIUM 8.2*  --  8.6* 8.6*    GFR: Estimated Creatinine Clearance: 86.6  mL/min (A) (by C-G formula based on SCr of 1.39 mg/dL (H)).  Liver Function Tests: Recent Labs  Lab 06/29/21 0240 06/30/21 0113  AST 18 17  ALT 16 17  ALKPHOS 56 57  BILITOT 0.5 0.6  PROT 6.3* 6.9  ALBUMIN 2.7* 2.9*    CBG: Recent Labs  Lab 06/30/21 1253 06/30/21 1711 06/30/21 2257 07/01/21 0753 07/01/21 1218  GLUCAP 228* 158* 211* 125* 152*     Recent Results (from the past 240 hour(s))  Resp Panel by RT-PCR (Flu A&B, Covid) Nasopharyngeal Swab     Status: None   Collection Time: 06/29/21  2:40 AM   Specimen: Nasopharyngeal Swab; Nasopharyngeal(NP) swabs in vial transport medium  Result Value Ref Range Status   SARS Coronavirus 2 by RT PCR NEGATIVE NEGATIVE Final    Comment: (NOTE) SARS-CoV-2 target nucleic acids are NOT DETECTED.  The SARS-CoV-2 RNA is generally detectable in upper respiratory specimens during the acute phase of infection. The lowest concentration of SARS-CoV-2 viral copies this assay can detect is 138 copies/mL. A negative result does not preclude SARS-Cov-2 infection and should not be  used as the sole basis for treatment or other patient management decisions. A negative result may occur with  improper specimen collection/handling, submission of specimen other than nasopharyngeal swab, presence of viral mutation(s) within the areas targeted by this assay, and inadequate number of viral copies(<138 copies/mL). A negative result must be combined with clinical observations, patient history, and epidemiological information. The expected result is Negative.  Fact Sheet for Patients:  EntrepreneurPulse.com.au  Fact Sheet for Healthcare Providers:  IncredibleEmployment.be  This test is Long t yet approved or cleared by the [place] FDA and  has been authorized for detection and/or diagnosis of SARS-CoV-2 by FDA under an Emergency Use Authorization (EUA). This EUA will remain  in effect (meaning this test can be used) for the duration of the COVID-19 declaration under Section 564(b)(1) of the Act, 21 U.S.C.section 360bbb-3(b)(1), unless the authorization is terminated  or revoked sooner.       Influenza A by PCR NEGATIVE NEGATIVE Final   Influenza B by PCR NEGATIVE NEGATIVE Final    Comment: (NOTE) The Xpert Xpress SARS-CoV-2/FLU/RSV plus assay is intended as an aid in the diagnosis of influenza from Nasopharyngeal swab specimens and should not be used as a sole basis for treatment. Nasal washings and aspirates are unacceptable for Xpert Xpress SARS-CoV-2/FLU/RSV testing.  Fact Sheet for Patients: EntrepreneurPulse.com.au  Fact Sheet for Healthcare Providers: IncredibleEmployment.be  This test is not yet approved or cleared by the [place] FDA and has been authorized for detection and/or diagnosis of SARS-CoV-2 by FDA under an Emergency Use Authorization (EUA). This EUA will remain in effect (meaning this test can be used) for the duration of the COVID-19 declaration under Section  564(b)(1) of the Act, 21 U.S.C. section 360bbb-3(b)(1), unless the authorization is terminated or revoked.  Performed at [facility], [place] [address], [place], [place] [address]   Blood culture (routine x 2)     Status: None (Preliminary result)   Collection Time: 06/29/21  3:20 AM   Specimen: BLOOD  Result Value Ref Range Status   Specimen Description BLOOD RIGHT HAND  Final   Special Requests   Final    BOTTLES DRAWN AEROBIC AND ANAEROBIC Blood Culture adequate volume   Culture   Final    Long GROWTH 2 DAYS Performed at [facility], [place] [address] [place], [place] [address]  Report Status PENDING  Incomplete  Blood culture (routine x 2)     Status: None (Preliminary result)   Collection Time: 06/29/21  3:26 AM   Specimen: BLOOD  Result Value Ref Range Status   Specimen Description BLOOD RIGHT ARM  Final   Special Requests   Final    BOTTLES DRAWN AEROBIC AND ANAEROBIC Blood Culture adequate volume   Culture   Final    Long GROWTH 2 DAYS Performed at [facility], [address], [place], [place] [address]    Report Status PENDING  Incomplete         Radiology Studies: ECHOCARDIOGRAM COMPLETE  Result Date: 06/30/2021    ECHOCARDIOGRAM REPORT   Patient Name:   NIQUAN DEFINA Date of Exam: 06/30/2021 Medical Rec #:  [id number]            Height:       74.0 in Accession #:    [id number]           Weight:       329.0 lb Date of Birth:  1961/07/24            BSA:          2.685 m Patient Age:    1 years             BP:           111/65 mmHg Patient Gender: M                    HR:           77 bpm. Exam Location:  Inpatient Procedure: 2D Echo, Cardiac Doppler, Color Doppler and Intracardiac            Opacification Agent Indications:    Congestive Heart Failure I50.9                 Elevated Troponin  History:        Patient has prior history of Echocardiogram examinations, most                 recent 04/05/2020. CHF, CAD; Risk Factors:Hypertension, Diabetes,                  Sleep Apnea and Former Smoker.  Sonographer:    [name] RDCS Referring Phys: [id number] LINDSAY NICOLE [place]  1. Left ventricular ejection fraction, by estimation, is 50 to 55%. The left ventricle has low normal function. The left ventricle has Long regional wall motion abnormalities. The left ventricular internal cavity size was mildly dilated. There is mild left ventricular hypertrophy. Left ventricular diastolic parameters were normal.  2. Right ventricular systolic function is normal. The right ventricular size is mildly enlarged. There is normal pulmonary artery systolic pressure. The estimated right ventricular systolic pressure is [id number] mmHg.  3. Left atrial size was mildly dilated.  4. Right atrial size was mildly dilated.  5. The mitral valve is normal in structure. Long evidence of mitral valve regurgitation. Long evidence of mitral stenosis.  6. The aortic valve is tricuspid. Aortic valve regurgitation is not visualized. Long aortic stenosis is present. Aortic valve mean gradient measures 9.0 mmHg.  7. Aortic dilatation noted. There is mild dilatation of the ascending aorta, measuring 40 mm.  8. The inferior vena cava is dilated in size with >50% respiratory variability, suggesting right atrial pressure of 8 mmHg. FINDINGS  Left Ventricle: Left ventricular ejection fraction, by estimation, is 50 to 55%. The  left ventricle has low normal function. The left ventricle has Long regional wall motion abnormalities. Definity contrast agent was given IV to delineate the left ventricular endocardial borders. The left ventricular internal cavity size was mildly dilated. There is mild left ventricular hypertrophy. Left ventricular diastolic parameters were normal. Right Ventricle: The right ventricular size is mildly enlarged. Long increase in right ventricular wall thickness. Right ventricular systolic function is normal. There is normal pulmonary artery systolic pressure. The tricuspid regurgitant  velocity is 2.46  m/s, and with an assumed right atrial pressure of 8 mmHg, the estimated right ventricular systolic pressure is [id number] mmHg. Left Atrium: Left atrial size was mildly dilated. Right Atrium: Right atrial size was mildly dilated. Pericardium: There is Long evidence of pericardial effusion. Mitral Valve: The mitral valve is normal in structure. Long evidence of mitral valve regurgitation. Long evidence of mitral valve stenosis. Tricuspid Valve: The tricuspid valve is normal in structure. Tricuspid valve regurgitation is trivial. Aortic Valve: The aortic valve is tricuspid. Aortic valve regurgitation is not visualized. Long aortic stenosis is present. Aortic valve mean gradient measures 9.0 mmHg. Aortic valve peak gradient measures 16.3 mmHg. Aortic valve area, by VTI measures 2.59  cm. Pulmonic Valve: The pulmonic valve was normal in structure. Pulmonic valve regurgitation is not visualized. Aorta: The aortic root is normal in size and structure and aortic dilatation noted. There is mild dilatation of the ascending aorta, measuring 40 mm. Venous: The inferior vena cava is dilated in size with greater than 50% respiratory variability, suggesting right atrial pressure of 8 mmHg. IAS/Shunts: Long atrial level shunt detected by color flow Doppler. Additional Comments: A device lead is visualized in the right ventricle.  LEFT VENTRICLE PLAX 2D LVIDd:         6.50 cm      Diastology LVIDs:         4.60 cm      LV e' medial:    9.14 cm/s LV PW:         1.10 cm      LV E/e' medial:  15.3 LV IVS:        1.10 cm      LV e' lateral:   11.60 cm/s LVOT diam:     2.40 cm      LV E/e' lateral: 12.1 LV SV:         103 LV SV Index:   38 LVOT Area:     4.52 cm  LV Volumes (MOD) LV vol d, MOD A2C: 343.0 ml LV vol d, MOD A4C: 312.0 ml LV vol s, MOD A2C: 182.0 ml LV vol s, MOD A4C: 160.0 ml LV SV MOD A2C:     161.0 ml LV SV MOD A4C:     312.0 ml LV SV MOD BP:      161.6 ml RIGHT VENTRICLE RV S prime:     15.60 cm/s TAPSE (M-mode):  3.0 cm LEFT ATRIUM              Index       RIGHT ATRIUM           Index LA diam:        5.30 cm  1.97 cm/m  RA Area:     36.00 cm LA Vol (A2C):   87.3 ml  32.51 ml/m RA Volume:   143.00 ml 53.25 ml/m LA Vol (A4C):   105.0 ml 39.10 ml/m LA Biplane Vol: 98.2 ml  36.57 ml/m  AORTIC VALVE AV Area (Vmax):  2.44 cm AV Area (Vmean):   2.50 cm AV Area (VTI):     2.59 cm AV Vmax:           202.00 cm/s AV Vmean:          141.000 cm/s AV VTI:            0.397 m AV Peak Grad:      16.3 mmHg AV Mean Grad:      9.0 mmHg LVOT Vmax:         109.00 cm/s LVOT Vmean:        77.900 cm/s LVOT VTI:          0.227 m LVOT/AV VTI ratio: 0.57  AORTA Ao Root diam: 3.40 cm Ao Asc diam:  4.00 cm Ao Desc diam: 2.90 cm MITRAL VALVE                TRICUSPID VALVE MV Area (PHT): 4.44 cm     TR Peak grad:   24.2 mmHg MV Decel Time: 171 msec     TR Vmax:        246.00 cm/s MV E velocity: 140.00 cm/s MV A velocity: 105.00 cm/s  SHUNTS MV E/A ratio:  1.33         Systemic VTI:  0.23 m                             Systemic Diam: 2.40 cm Loralie Champagne MD Electronically signed by Loralie Champagne MD Signature Date/Time: 06/30/2021/2:19:43 PM    Final         Scheduled Meds:  sodium chloride   Intravenous Once   atorvastatin  40 mg Oral q1800   carvedilol  6.25 mg Oral BID WC   dapagliflozin propanediol  10 mg Oral Daily   ferrous sulfate  325 mg Oral QODAY   furosemide  20 mg Intravenous Once   furosemide  60 mg Oral BID   gabapentin  600 mg Oral TID   insulin aspart  0-5 Units Subcutaneous QHS   insulin aspart  0-9 Units Subcutaneous TID WC   insulin glargine-yfgn  20 Units Subcutaneous Daily   multivitamin with minerals  1 tablet Oral Daily   pantoprazole  40 mg Oral Q0600   potassium chloride  40 mEq Oral Daily   QUEtiapine  450 mg Oral Daily   sodium chloride flush  3 mL Intravenous Q12H   sodium chloride flush  3 mL Intravenous Q12H   spironolactone  25 mg Oral Daily   Continuous Infusions:  sodium chloride      sodium chloride     cefTRIAXone (ROCEPHIN)  IV 1 g (07/01/21 0504)   ferumoxytol 510 mg (06/30/21 1520)     LOS: 2 days      Phillips Climes, MD [facility]   To contact the attending provider between 7A-7P or the covering provider during after hours 7P-7A, please log into the web site [email or web address] and access using universal [place] password for that web site. If you do not have the password, please call the hospital operator.  07/01/2021, 1:58 PM

## 2021-07-01 NOTE — Progress Notes (Signed)
Pt places self on CPaP for the night. Will call this RT if he needs help

## 2021-07-01 NOTE — Anesthesia Postprocedure Evaluation (Signed)
Anesthesia Post Note  Patient: [name]  Procedure(s) Performed: ENTEROSCOPY HOT HEMOSTASIS (ARGON PLASMA COAGULATION/BICAP) HEMOSTASIS CLIP PLACEMENT     Patient location during evaluation: Endoscopy Anesthesia Type: MAC Level of consciousness: awake and alert Pain management: pain level controlled Vital Signs Assessment: post-procedure vital signs reviewed and stable Respiratory status: spontaneous breathing, nonlabored ventilation, respiratory function stable and patient connected to nasal cannula oxygen Cardiovascular status: blood pressure returned to baseline and stable Postop Assessment: no apparent nausea or vomiting Anesthetic complications: no   No notable events documented.  Last Vitals:  Vitals:   07/01/21 1050 07/01/21 1057  BP: (!) 132/40 (!) 166/84  Pulse: 80 80  Resp: 15 18  Temp:    SpO2: 94% 92%    Last Pain:  Vitals:   07/01/21 1057  TempSrc:   PainSc: 0-No pain                 Barnet [place]

## 2021-07-01 NOTE — Telephone Encounter (Addendum)
Auth Submission: PENDING  Payer: [employer] Medication & CPT/J Code(s) submitted: Feraheme (ferumoxytol) [id number] Route of submission (phone, fax, portal): PHONE [phone] Option #5 Auth type: Buy/Bill Units/visits requested: 1 Reference number: [id number]   Will update once we receive a response.

## 2021-07-02 DIAGNOSIS — I5032 Chronic diastolic (congestive) heart failure: Secondary | ICD-10-CM | POA: Diagnosis not present

## 2021-07-02 DIAGNOSIS — K922 Gastrointestinal hemorrhage, unspecified: Secondary | ICD-10-CM | POA: Diagnosis not present

## 2021-07-02 DIAGNOSIS — D62 Acute posthemorrhagic anemia: Secondary | ICD-10-CM | POA: Diagnosis not present

## 2021-07-02 DIAGNOSIS — I2510 Atherosclerotic heart disease of native coronary artery without angina pectoris: Secondary | ICD-10-CM | POA: Diagnosis not present

## 2021-07-02 LAB — GLUCOSE, CAPILLARY
Glucose-Capillary: 111 mg/dL — ABNORMAL HIGH (ref 70–99)
Glucose-Capillary: 134 mg/dL — ABNORMAL HIGH (ref 70–99)

## 2021-07-02 LAB — BASIC METABOLIC PANEL
Anion gap: 6 (ref 5–15)
BUN: 16 mg/dL (ref 8–23)
CO2: 29 mmol/L (ref 22–32)
Calcium: 8.7 mg/dL — ABNORMAL LOW (ref 8.9–10.3)
Chloride: 101 mmol/L (ref 98–111)
Creatinine, Ser: 1.29 mg/dL — ABNORMAL HIGH (ref 0.61–1.24)
GFR, Estimated: >60 mL/min (ref >60)
Glucose, Bld: 182 mg/dL — ABNORMAL HIGH (ref 70–99)
Potassium: 4.2 mmol/L (ref 3.5–5.1)
Sodium: 136 mmol/L (ref 135–145)

## 2021-07-02 LAB — CBC
HCT: 29.5 % — ABNORMAL LOW (ref 39.0–52.0)
Hemoglobin: 8.3 g/dL — ABNORMAL LOW (ref 13.0–17.0)
MCH: 22.7 pg — ABNORMAL LOW (ref 26.0–34.0)
MCHC: 28.1 g/dL — ABNORMAL LOW (ref 30.0–36.0)
MCV: 80.6 fL (ref 80.0–100.0)
Platelets: 369 10*3/uL (ref 150–400)
RBC: 3.66 MIL/uL — ABNORMAL LOW (ref 4.22–5.81)
RDW: 19.5 % — ABNORMAL HIGH (ref 11.5–15.5)
WBC: 9.5 10*3/uL (ref 4.0–10.5)
nRBC: 0.2 % (ref 0.0–0.2)

## 2021-07-02 MED ORDER — ACETAMINOPHEN 500 MG PO TABS: 1000.0000 mg | ORAL_TABLET | Freq: Two times a day (BID) | ORAL | 3 refills | Status: AC | PRN

## 2021-07-02 NOTE — TOC Transition Note (Signed)
Transition of Care [facility]) - CM/SW Discharge Note   Patient Details  Name: [name] MRN: [id number] Date of Birth: 1961-03-10  [facility]) CM/SW Contact:  Carles Collet, RN Phone Number: 07/02/2021, 12:39 PM   Clinical Narrative:    Oxygen set up for home. Referral made to [employer], tanks will be delivered to room today prior to DC. [employer] will set up home oxygen concentrator after DC. No other CM needs identified.     Final next level of care: Home/Self Care Barriers to Discharge: No Barriers Identified   Patient Goals and CMS Choice        Discharge Placement                       Discharge Plan and Services In-house Referral: Clinical Social Work              DME Arranged: Oxygen DME Agency:  [facility]) Date DME Agency Contacted: 07/02/21 Time DME Agency Contacted: [phone] Representative spoke with at DME Agency: Brenton Grills            Social Determinants of Health ([place]) Interventions Food Insecurity Interventions: Intervention Not Indicated Housing Interventions: Intervention Not Indicated Transportation Interventions: SCAT ([place]), [facility]   Readmission Risk Interventions No flowsheet data found.

## 2021-07-02 NOTE — Discharge Summary (Signed)
Physician Discharge Summary  Patient ID: [name] MRN: [id number] DOB/AGE: September 22, 1961 60 y.o.  Admit date: 06/29/2021 Discharge date: 07/02/2021  Admission Diagnoses:  Discharge Diagnoses:  Principal Problem:   GI bleed Active Problems:   Paranoid schizophrenia, chronic condition ([place])   Severe recurrent major depressive disorder with psychotic features (HCC)   GAD (generalized anxiety disorder)   OCD (obsessive compulsive disorder)   CHF (congestive heart failure) ([place])   Type 2 diabetes mellitus with hyperglycemia, with long-term current use of insulin (HCC)   Acute on chronic systolic (congestive) heart failure (HCC)   PAF (paroxysmal atrial fibrillation) (HCC)   OSA on CPAP   Anemia due to chronic blood loss   CKD (chronic kidney disease), stage III (HCC)   Acute blood loss anemia   Chronic anticoagulation   ICD (implantable cardioverter-defibrillator) in place   Severe anemia   Melena   Chronic diastolic CHF (congestive heart failure) ([place])   Discharged Condition: stable  Hospital Course:  Patient is a 60 year old male with recurrent IDA secondary to GI blood loss from gastric and small bowel angioectasias, history of multiple colon polyps, CAD and atrial fibrillation on Eliquis, chronic CHF admitted with recurrent profound symptomatic iron deficiency anemia.  Patient was admitted for further work-up and management of GI bleed.  On admission, patient's hemoglobin was 6.1 g/dL.  Patient was transfused with packed red blood cells during the hospital stay.  Patient also received IV iron.  Patient underwent small bowel enteroscopy prior to discharge (currently see below).  On discharge, patient will follow with primary care provider and GI team.  The PCP and the GI team will continue to monitor patient on discharge.  Acute blood Loss anemia due to GI bleed -Patient with known history of recurrent GI bleed in the past, on Eliquis, he just recently had 2 endoscopies in  May, work-up significant for intestinal angiectasias -Her H&H closely, will repeat CBC currently and transfuse for target hemoglobin of 8 -Transfused 3 units 8/3. -Transfuse another 2 units 8/4 -Recieved IV iron 8/4 -Post small bowel enteroscopy by Dr. Caffie Pinto 8/5 significant for mild gastritis, 3 nonbleeding angiectasia's in the duodenum treated with argon plasma coagulation APC, clip was placed x1, jejunum was normal. -Recommendation per GI: 1-can resume Eliquis tomorrow. 2-No NSAIDs. 3-Will need daily PPI 4-monitor H&H closely 5-To receive second dose of IV iron Feraheme in 1 week as an outpatient, GI will arrange at infusion center Hemoglobin prior to discharge was 8.3 g/dL (stable).    Chest pain, with elevated troponin with known CAD -Mmost likely demand ischemia in the setting of severe anemia - HS troponin [id number]. -Cardiology input greatly appreciated, they would only consider LHC if recurrent chest pain after treating his anemia. -Continue with statin, no aspirin due to GI bleed   Paranoid schizophrenia, chronic condition ([place]) -currently stable, continue with home medications    severe recurrent major depressive disorder with psychotic features ([place])  -continue home medication including high-dose Seroquel    GAD (generalized anxiety disorder) -stable, continue anxiolytics     OCD (obsessive compulsive disorder) -stable resuming home meds    Chronic dCHF (congestive heart failure) (HCC)  -No signs of exacerbation, monitoring for volume overload with PRBC transfusion -we will pursue with IV Lasix today, resuming oral Lasix in a.m. -Holding home medication of spironolactone, continue Entresto -We will monitor daily weight, I's and O's -Last echocardiogram reviewed 04/15/2020, ejection fraction 30%, with grade 2 diastolic dysfunction    Type 2 diabetes mellitus with hyperglycemia,  with long-term current use of insulin (HCC) -Withholding home medications of insulin  lispro 75/25, Glucotrol, continue home medication of Lantus at 20 units nightly and insulin sliding scale    PAF (paroxysmal atrial fibrillation) ([place])  -continue home medication of Coreg, with holding Eliquis   OSA on CPAP  -currently stable, supplemental O2     CKD (chronic kidney disease), stage III (HCC) -mildly elevated creatinine from baseline , creatinine this morning 1.60, BUN 31 --likely exacerbated by severe anemia -we will monitor closely, avoiding nephrotoxins     Chronic anticoagulation -on Eliquis due to A. Fib.  Continue   ICD (implantable cardioverter-defibrillator) in place -stable in place  Consults: cardiology and GI  Significant Diagnostic Studies:  Small bowel enteroscopy done on 07/01/2021 revealed: "- Normal esophagus. Inpatient - Mild gastritis. Not source of bleeding. - Three non-bleeding angioectasias in the duodenum. Treated with argon plasma coagulation (APC). Clip (MR conditional) was placed x 1. - The examined portion of the jejunum was normal. - No specimens collected. N/A  Moderate Sedation: - Return patient to hospital ward for ongoing care. - Resume previous diet. - Can resume Eliquis tomorrow. - Would use daily PPI. - No NSAIDs. - Patient will need blood counts watched closely as an outpatient given history and chronic anticoagulation. - Dose #2 of IV iron (Feraheme) in 1 week as an outpatient. I will order at the [facility] for discharge from GI perspective per primary team. - GI will sign off, call if questions".  Discharge Exam: Blood pressure (!) 140/47, pulse 68, temperature 98.5 F (36.9 C), temperature source Oral, resp. rate 16, height '6\' 2"'  (1.88 m), weight (!) 151 kg, SpO2 94 %.   Disposition: 01-Home or Self Care  Discharge Instructions     Diet - low sodium heart healthy   Complete by: As directed    Diet Carb Modified   Complete by: As directed    Increase activity slowly   Complete by: As directed        Allergies as of 07/02/2021   No Known Allergies      Medication List     TAKE these medications    acetaminophen 500 MG tablet Commonly known as: TYLENOL Take 2 tablets (1,000 mg total) by mouth 2 (two) times daily as needed. What changed:  when to take this reasons to take this   albuterol 108 (90 Base) MCG/ACT inhaler Commonly known as: VENTOLIN HFA INHALE 2 PUFFS INTO THE LUNGS EVERY 6 (SIX) HOURS AS NEEDED FOR WHEEZING OR SHORTNESS OF BREATH.   atorvastatin 40 MG tablet Commonly known as: LIPITOR Take 1 tablet (40 mg total) by mouth daily at 6 PM.   blood glucose meter kit and supplies Kit Dispense based on patient and insurance preference. Use up to four times daily as directed. (FOR ICD-9 250.00, 250.01).   carvedilol 6.25 MG tablet Commonly known as: COREG Take 1 tablet (6.25 mg total) by mouth 2 (two) times daily with a meal.   Centrum Silver 50+Men Tabs Take 1 tablet by mouth daily.   cyclobenzaprine 10 MG tablet Commonly known as: FLEXERIL Take 10 mg by mouth 3 (three) times daily as needed for muscle spasms.   Eliquis 5 MG Tabs tablet Generic drug: apixaban Take 1 tablet (5 mg total) by mouth 2 (two) times daily.   Entresto 24-26 MG Generic drug: sacubitril-valsartan Take 1 tablet by mouth 2 (two) times daily.   Farxiga 10 MG Tabs tablet Generic drug: dapagliflozin propanediol  Take 1 tablet (10 mg total) by mouth daily before breakfast.   ferrous sulfate 325 (65 FE) MG tablet Take 1 tablet (325 mg total) by mouth every other day.   FreeStyle Libre 14 Day Reader Kerrin Mo 1 each by Does not apply route as needed.   furosemide 40 MG tablet Commonly known as: LASIX Take 1.5 tablets (60 mg total) by mouth 2 (two) times daily.   gabapentin 300 MG capsule Commonly known as: NEURONTIN TAKE 2 CAPSULES (TOTAL = 600 MG), BY MOUTH, 3 TIMES A DAY.   glipiZIDE 10 MG tablet Commonly known as: GLUCOTROL TAKE 1 TABLET (10 MG TOTAL) BY MOUTH 2 (TWO)  TIMES DAILY BEFORE A MEAL.   Insulin Lispro Prot & Lispro (75-25) 100 UNIT/ML Kwikpen Commonly known as: HumaLOG Mix 75/25 KwikPen Inject 30 Units into the skin 2 (two) times daily.   Lantus 100 UNIT/ML injection Generic drug: insulin glargine INJECT 0.2 MLS (20 UNITS TOTAL) INTO THE SKIN DAILY. What changed:  how much to take how to take this when to take this   metFORMIN 500 MG tablet Commonly known as: GLUCOPHAGE TAKE 1 TABLET (500 MG TOTAL) BY MOUTH 2 (TWO) TIMES DAILY WITH A MEAL.   pantoprazole 40 MG tablet Commonly known as: PROTONIX Take 1 tablet (40 mg total) by mouth daily.   potassium chloride SA 20 MEQ tablet Commonly known as: KLOR-CON Take 2 tablets (40 mEq total) by mouth daily.   QUEtiapine 300 MG tablet Commonly known as: SEROquel Take 1 & 1/2 tablet by mouth daily. What changed:  how much to take how to take this when to take this Another medication with the same name was removed. Continue taking this medication, and follow the directions you see here.   spironolactone 25 MG tablet Commonly known as: ALDACTONE Take 0.5 tablets (12.5 mg total) by mouth at bedtime.   tetrahydrozoline 0.05 % ophthalmic solution Place 1 drop into both eyes daily.   TRUEplus Insulin Syringe 31G X 5/16" 0.3 ML Misc Generic drug: Insulin Syringe-Needle U-100 USE 3 TIMES DAILY TO INJECT INSULIN   TRUEplus Pen Needles 31G X 6 MM Misc Generic drug: Insulin Pen Needle USE AS DIRECTED   TRUEplus Pen Needles 31G X 6 MM Misc Generic drug: Insulin Pen Needle USE AS DIRECTED               Durable Medical Equipment  (From admission, onward)           Start     Ordered   07/02/21 1101  DME Oxygen  Once       Comments: Supplemental oxygen at night time and PRN  Question Answer Comment  Length of Need 12 Months   Mode or (Route) Nasal cannula   Liters per Minute 2   Oxygen conserving device No   Oxygen delivery system Gas      07/02/21 1109             Follow-up Information     [facility] Follow up on 07/15/2021.   Specialty: Cardiology Why: Advanced Heart Failure Clinic at [facility] at 2:30 pm. Entrance C Garage Code [phone] Contact information: [address] [phone] [place]        Dorna Mai, MD. Go in 30 day(s).   Specialty: Family Medicine Why: Primary Care Provider Wednesday 08/03/2021 at 1:50pm Phone [phone] with Dr. Dorna Mai, MD Contact information: [address] [place] [address]  [phone]                 Signed: Bonnell Public 07/02/2021, 11:10 AM

## 2021-07-02 NOTE — Progress Notes (Signed)
Patient discharged from 5W01. O2 tank delivered to room, education provided. AVS reviewed with patient and all questions answered. VS stable. IV dc'd. All patient belongings taken home. Patient taken down to his ride via wheelchair.

## 2021-07-02 NOTE — Progress Notes (Signed)
Advanced Heart Failure Rounding Note  PCP-Cardiologist: [name], MD  HF clinic: Dr. Aundra Dubin  Subjective:    Hgb 8.3. No further bleeding. Denies CP or SOB.   Creatinine 1.60 > 1.40 > 1.39 > 1.29  EGD on 8/5: Three non-bleeding angioectasias in the duodenum.Treated with argon plasma coagulation (APC).    Echo 08/04: EF 50-55%, mildly dilated LV, mild LVH, RV okay, dilated IVC with estimated RA pressure of 8  Objective:   Weight Range: (!) 151 kg Body mass index is 42.74 kg/m.   Vital Signs:   Temp:  [98.2 F (36.8 C)-100 F (37.8 C)] 98.5 F (36.9 C) (08/06 0747) Pulse Rate:  [66-84] 68 (08/06 0747) Resp:  [14-18] 16 (08/06 0747) BP: (127-166)/(47-89) 140/47 (08/06 0747) SpO2:  [86 %-96 %] 93 % (08/06 1118) Weight:  [151 kg] 151 kg (08/06 0400) Last BM Date: 07/02/21  Weight change: Filed Weights   06/30/21 1100 07/01/21 0500 07/02/21 0400  Weight: (!) 151.8 kg (!) 150.9 kg (!) 151 kg    Intake/Output:   Intake/Output Summary (Last 24 hours) at 07/02/2021 1124 Last data filed at 07/02/2021 0300 Gross per 24 hour  Intake 580 ml  Output --  Net 580 ml       Physical Exam    General:  Well appearing. No resp difficulty HEENT: normal Neck: supple. no JVD. Carotids 2+ bilat; no bruits. No lymphadenopathy or thryomegaly appreciated. Cor: PMI nondisplaced. Regular rate & rhythm. No rubs, gallops or murmurs. Lungs: clear Abdomen: obese soft, nontender, nondistended. No hepatosplenomegaly. No bruits or masses. Good bowel sounds. Extremities: no cyanosis, clubbing, rash, edema Neuro: alert & orientedx3, cranial nerves grossly intact. moves all 4 extremities w/o difficulty. Affect pleasant   Telemetry   NSR 60-70s Personally reviewed   Labs    CBC Recent Labs    07/01/21 1638 07/02/21 0028  WBC 12.1* 9.5  HGB 8.5* 8.3*  HCT 28.9* 29.5*  MCV 78.7* 80.6  PLT 385 0000000    Basic Metabolic Panel Recent Labs    07/01/21 0406 07/02/21 0028  NA  137 136  K 4.6 4.2  CL 102 101  CO2 28 29  GLUCOSE 140* 182*  BUN 22 16  CREATININE 1.39* 1.29*  CALCIUM 8.6* 8.7*    Liver Function Tests Recent Labs    06/30/21 0113  AST 17  ALT 17  ALKPHOS 57  BILITOT 0.6  PROT 6.9  ALBUMIN 2.9*    No results for input(s): LIPASE, AMYLASE in the last 72 hours. Cardiac Enzymes No results for input(s): CKTOTAL, CKMB, CKMBINDEX, TROPONINI in the last 72 hours.  BNP: BNP (last 3 results) Recent Labs    05/11/21 1614 06/29/21 0240 06/29/21 1654  BNP 110.8* 246.0* 349.4*     ProBNP (last 3 results) Recent Labs    02/23/21 1315  PROBNP 249*      D-Dimer No results for input(s): DDIMER in the last 72 hours. Hemoglobin A1C No results for input(s): HGBA1C in the last 72 hours.  Fasting Lipid Panel No results for input(s): CHOL, HDL, LDLCALC, TRIG, CHOLHDL, LDLDIRECT in the last 72 hours. Thyroid Function Tests No results for input(s): TSH, T4TOTAL, T3FREE, THYROIDAB in the last 72 hours.  Invalid input(s): FREET3  Other results:   Imaging    No results found.   Medications:     Scheduled Medications:  sodium chloride   Intravenous Once   atorvastatin  40 mg Oral q1800   carvedilol  6.25 mg Oral BID  WC   dapagliflozin propanediol  10 mg Oral Daily   ferrous sulfate  325 mg Oral QODAY   furosemide  20 mg Intravenous Once   furosemide  60 mg Oral BID   gabapentin  600 mg Oral TID   insulin aspart  0-5 Units Subcutaneous QHS   insulin aspart  0-9 Units Subcutaneous TID WC   insulin glargine-yfgn  20 Units Subcutaneous Daily   multivitamin with minerals  1 tablet Oral Daily   pantoprazole  40 mg Oral Q0600   potassium chloride  40 mEq Oral Daily   QUEtiapine  450 mg Oral Daily   sacubitril-valsartan  1 tablet Oral BID   sodium chloride flush  3 mL Intravenous Q12H   sodium chloride flush  3 mL Intravenous Q12H   spironolactone  25 mg Oral Daily    Infusions:  sodium chloride     sodium chloride      cefTRIAXone (ROCEPHIN)  IV 1 g (07/02/21 0349)   ferumoxytol 510 mg (06/30/21 1520)    PRN Medications: sodium chloride, acetaminophen **OR** acetaminophen, bisacodyl, hydrALAZINE, HYDROmorphone (DILAUDID) injection, ipratropium, levalbuterol, naphazoline-glycerin, ondansetron **OR** ondansetron (ZOFRAN) IV, oxyCODONE, senna-docusate, sodium chloride flush    Assessment/Plan   Chest pain/elevated troponin with known CAD: -CP in setting of severe anemia. No recurrence after transfusions. -HS troponin [id number]. -Troponin elevation likely secondary to demand ischemia from severe anemia.  -Does have known CAD. Last cath 11/20220 with occluded OM2 stent, 90% small OM1, diffuse disease RCA treated medically.  Slight ST depression in inferolateral leads in territory of known disease. -Currently no s/s angina with correction of anemia -On statin. No aspirin d/t GI bleeding.   2. Acute GI bleed: -history of recurrent upper GI bleed d/t gastric and duodenal AVMs, most recently May 2022 -Hemoglobin down to 4.7, up to 8.3 after 6 units pRBCs -Enteroscopy 8/5 with mild gastritis, three non bleeding angioectasias in duodenum treated with APC and 1 clip -Iron deficient. Received IV iron. -Continue PPI -Avoid NSAIDs in future - reports regular use of Excedrin PTA -Refrain from alcohol   3. Paroxysmal atrial fibrillation: -Remains in NSR -CHADS2-VASc score = 4 (CAD, CHF, HTN, DM), had been on anticoagulation with Eliquis. Holding d/t GI bleed. Ok to restart per GI. Dr. Aundra Dubin suggested restarting on 8/7 if hgb stable -D/t recurrent GI bleeding, will arrange referral to discuss left atrial appendage occlusion with watchman device after discharge. -If not a candidate for watchman, will need to weigh risks/benefits of long-term anticoagulation. Discussed importance of avoiding NSAIDs and alcohol in the future.   4. Chronic systolic CHF with recovery in LV function:  -Echo in 11/20 with EF 25-30%,  echo 5/21 with EF 30% with mildly decreased RV systolic function. Biotronik ICD.  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   -Has cut back on alcohol consumption significantly, should avoid altogether -Repeat echo with improvement in LVEF to 50-55% -Weight 332#, 323# at clinic visit in July when compensated. NYHA class IV symptoms on admit but likely d/t anemia. BNP slightly than in June. - Volume status ok. Back on furosemide at 60 mg BID today (home dose) -Continue Coreg 6.25 mg bid.   -Continue Entresto 24/26 bid - Cntinue spiro at 25 mg daily (on 12.5 mg at home) d/t probable diastolic CHF and elevated BP, continue Farxiga 10 mg daily   5. HTN: -BP more elevated today.    6. OSA:  -CPAP machine dated and does not work. Updated sleep study ordered at last HF visit  7. AKI on CKD 3a: -Likely d/t volume depletion in setting of severe anemia -Scr improved to 1.29    Ok for d/c today from our standpoint. F/u in HF clinic scheduled.   Length of Stay: 3  Glori Bickers, MD  07/02/2021, 11:24 AM  Advanced Heart Failure Team Pager [phone] (M-F; 7a - 5p)  Please contact [facility] for night-coverage after hours (5p -7a ) and weekends on [email or web address]

## 2021-07-02 NOTE — Progress Notes (Signed)
SATURATION QUALIFICATIONS: (This note is used to comply with regulatory documentation for home oxygen)  Patient Saturations on Room Air at Rest = 93%  Patient Saturations on Room Air while Ambulating = 87%  Patient Saturations on 2 Liters of oxygen while Ambulating = 95%

## 2021-07-04 ENCOUNTER — Other Ambulatory Visit: Payer: Self-pay

## 2021-07-04 ENCOUNTER — Ambulatory Visit (HOSPITAL_COMMUNITY)
Admission: RE | Admit: 2021-07-04 | Discharge: 2021-07-04 | Disposition: A | Discharge status: Home / Self Care | Payer: Medicare (Managed Care) | Source: Ambulatory Visit | Attending: Internal Medicine | Admitting: Internal Medicine

## 2021-07-04 ENCOUNTER — Other Ambulatory Visit: Payer: Self-pay | Admitting: Nurse Practitioner

## 2021-07-04 ENCOUNTER — Other Ambulatory Visit (HOSPITAL_COMMUNITY): Payer: Self-pay

## 2021-07-04 DIAGNOSIS — I5022 Chronic systolic (congestive) heart failure: Secondary | ICD-10-CM | POA: Diagnosis present

## 2021-07-04 LAB — BASIC METABOLIC PANEL
Anion gap: 9 (ref 5–15)
BUN: 13 mg/dL (ref 8–23)
CO2: 27 mmol/L (ref 22–32)
Calcium: 9 mg/dL (ref 8.9–10.3)
Chloride: 104 mmol/L (ref 98–111)
Creatinine, Ser: 1 mg/dL (ref 0.61–1.24)
GFR, Estimated: >60 mL/min (ref >60)
Glucose, Bld: 116 mg/dL — ABNORMAL HIGH (ref 70–99)
Potassium: 4.1 mmol/L (ref 3.5–5.1)
Sodium: 140 mmol/L (ref 135–145)

## 2021-07-04 MED FILL — Gabapentin Cap 300 MG: ORAL | 30 days supply | Qty: 180 | Fill #4 | Status: CN

## 2021-07-04 NOTE — Progress Notes (Signed)
Paramedicine Encounter    Patient ID: [name], male    DOB: 09/30/1961, 60 y.o.   MRN: [id number]   Patient Care Team: Pcp, No as PCP - General Turner, Traci R, MD as PCP - Cardiology (Cardiology) McLean, [name] S, MD as PCP - Advanced Heart Failure (Cardiology) Lambert, Cameron T, MD as PCP - Electrophysiology (Cardiology) Uris, Jenna H, LCSW as Social Worker (Licensed Clinical Social Worker) Beavers, [name], MD as Consulting Physician (Gastroenterology)  Patient Active Problem List   Diagnosis Date Noted   Melena    Chronic diastolic CHF (congestive heart failure) (HCC)    GI bleed 06/29/2021   Severe anemia 06/29/2021   Adenomatous polyp of ascending colon    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   Acute blood loss anemia    Angiodysplasia of stomach    Chronic anticoagulation    CHF exacerbation (HCC) 06/15/2020   AKI (acute kidney injury) (HCC) 06/15/2020   CKD (chronic kidney disease), stage III (HCC) 06/15/2020   Anemia due to chronic blood loss    Gastric AVM    Angiodysplasia of duodenum with hemorrhage    Acute on chronic systolic (congestive) heart failure (HCC) 04/05/2020   Anemia 04/05/2020   PAF (paroxysmal atrial fibrillation) (HCC) 04/05/2020   OSA on CPAP 04/05/2020   Syncope and collapse 04/05/2020   Type 2 diabetes mellitus with hyperglycemia, with long-term current use of insulin (HCC) 12/03/2019   Diabetic polyneuropathy associated with type 2 diabetes mellitus (HCC) 12/03/2019   Erectile dysfunction 12/03/2019   CHF (congestive heart failure) (HCC) 10/11/2019   Paranoid schizophrenia, chronic condition (HCC) 01/26/2015   Severe recurrent major depressive disorder with psychotic features (HCC) 01/26/2015   GAD (generalized anxiety disorder) 01/26/2015   OCD (obsessive compulsive disorder) 01/26/2015   Panic disorder without agoraphobia 01/26/2015   Insomnia 01/26/2015    Current Outpatient Medications:     acetaminophen (TYLENOL) 500 MG tablet, Take 2 tablets (1,000 mg total) by mouth 2 (two) times daily as needed., Disp: 180 tablet, Rfl: 3   albuterol (VENTOLIN HFA) 108 (90 Base) MCG/ACT inhaler, INHALE 2 PUFFS INTO THE LUNGS EVERY 6 (SIX) HOURS AS NEEDED FOR WHEEZING OR SHORTNESS OF BREATH., Disp: 18 g, Rfl: 3   apixaban (ELIQUIS) 5 MG TABS tablet, Take 1 tablet (5 mg total) by mouth 2 (two) times daily., Disp: 180 tablet, Rfl: 3   atorvastatin (LIPITOR) 40 MG tablet, Take 1 tablet (40 mg total) by mouth daily at 6 PM., Disp: 90 tablet, Rfl: 3   blood glucose meter kit and supplies KIT, Dispense based on patient and insurance preference. Use up to four times daily as directed. (FOR ICD-9 250.00, 250.01)., Disp: 1 each, Rfl: 0   carvedilol (COREG) 6.25 MG tablet, Take 1 tablet (6.25 mg total) by mouth 2 (two) times daily with a meal., Disp: 60 tablet, Rfl: 3   Continuous Blood Gluc Receiver (FREESTYLE LIBRE 14 DAY READER) DEVI, 1 each by Does not apply route as needed., Disp: 1 each, Rfl: 11   cyclobenzaprine (FLEXERIL) 10 MG tablet, Take 10 mg by mouth 3 (three) times daily as needed for muscle spasms., Disp: , Rfl:    dapagliflozin propanediol (FARXIGA) 10 MG TABS tablet, Take 1 tablet (10 mg total) by mouth daily before breakfast., Disp: 30 tablet, Rfl: 11   ferrous sulfate 325 (65 FE) MG tablet, Take 1 tablet (325 mg total) by mouth every other day., Disp: 30 tablet, Rfl: 0   furosemide (LASIX) 40 MG tablet, Take   1.5 tablets (60 mg total) by mouth 2 (two) times daily., Disp: 90 tablet, Rfl: 3   gabapentin (NEURONTIN) 300 MG capsule, TAKE 2 CAPSULES (TOTAL = 600 MG), BY MOUTH, 3 TIMES A DAY., Disp: 180 capsule, Rfl: 11   glipiZIDE (GLUCOTROL) 10 MG tablet, TAKE 1 TABLET (10 MG TOTAL) BY MOUTH 2 (TWO) TIMES DAILY BEFORE A MEAL., Disp: 60 tablet, Rfl: 6   insulin glargine (LANTUS) 100 UNIT/ML injection, INJECT 0.2 MLS (20 UNITS TOTAL) INTO THE SKIN DAILY., Disp: 30 mL, Rfl: 3   Insulin Lispro Prot &  Lispro (HUMALOG MIX 75/25 KWIKPEN) (75-25) 100 UNIT/ML Kwikpen, Inject 30 Units into the skin 2 (two) times daily., Disp: 15 mL, Rfl: 11   Insulin Pen Needle 31G X 6 MM MISC, USE AS DIRECTED, Disp: 100 each, Rfl: 0   Insulin Syringe-Needle U-100 31G X 5/16" 0.3 ML MISC, USE 3 TIMES DAILY TO INJECT INSULIN, Disp: 300 each, Rfl: 3   metFORMIN (GLUCOPHAGE) 500 MG tablet, TAKE 1 TABLET (500 MG TOTAL) BY MOUTH 2 (TWO) TIMES DAILY WITH A MEAL., Disp: 60 tablet, Rfl: 6   Multiple Vitamins-Minerals (CENTRUM SILVER 50+MEN) TABS, Take 1 tablet by mouth daily., Disp: , Rfl:    pantoprazole (PROTONIX) 40 MG tablet, Take 1 tablet (40 mg total) by mouth daily., Disp: 30 tablet, Rfl: 0   potassium chloride SA (KLOR-CON) 20 MEQ tablet, Take 2 tablets (40 mEq total) by mouth daily., Disp: 60 tablet, Rfl: 3   QUEtiapine (SEROQUEL) 300 MG tablet, Take 1 & 1/2 tablet by mouth daily., Disp: 45 tablet, Rfl: 0   sacubitril-valsartan (ENTRESTO) 24-26 MG, Take 1 tablet by mouth 2 (two) times daily., Disp: 180 tablet, Rfl: 3   spironolactone (ALDACTONE) 25 MG tablet, Take 0.5 tablets (12.5 mg total) by mouth at bedtime., Disp: 45 tablet, Rfl: 3   tetrahydrozoline 0.05 % ophthalmic solution, Place 1 drop into both eyes daily., Disp: , Rfl:    TRUEPLUS PEN NEEDLES 31G X 6 MM MISC, USE AS DIRECTED, Disp: 100 each, Rfl: 0 No Known Allergies   Social History   Socioeconomic History   Marital status: Legally Separated    Spouse name: Not on file   Number of children: Not on file   Years of education: Not on file   Highest education level: Not on file  Occupational History   Not on file  Tobacco Use   Smoking status: Former   Smokeless tobacco: Never  Vaping Use   Vaping Use: Never used  Substance and Sexual Activity   Alcohol use: Not Currently    Alcohol/week: 2.0 standard drinks    Types: 2 Cans of beer per week   Drug use: No   Sexual activity: Yes  Other Topics Concern   Not on file  Social History  Narrative   Not on file   Social Determinants of Health   Financial Resource Strain: Not on file  Food Insecurity: No Food Insecurity   Worried About Charity fundraiser in the Last Year: Never true   Ran Out of Food in the Last Year: Never true  Transportation Needs: Unmet Transportation Needs   Lack of Transportation (Medical): Yes   Lack of Transportation (Non-Medical): Yes  Physical Activity: Not on file  Stress: Not on file  Social Connections: Not on file  Intimate Partner Violence: Not on file    Physical Exam  Met with [name] in clinic today after his lab appointment where we were able to review his discharge  and medications. Medications were confirmed and pill box was filled accordingly.   Refills: Eliquis Wilder Glade (should be getting shipped) Pantoprazole Glipizide Metformin  Gabapentin   Entresto samples 24-26 until new RX arrives at home. I will call [employer] for same.   [name] requires print out for total spent OOP at [facility] for Eliquis, he obtained same and I will get it from him next week.   Visit complete. I will see [name] in one week.     Future Appointments  Date Time Provider [place]  07/04/2021  2:30 PM [facility] LAB [facility] None  07/15/2021  2:30 PM [facility] PA/NP [facility] None  07/20/2021  2:00 PM [facility] PA/NP [facility] None  08/03/2021  1:50 PM Dorna Mai, MD [facility] None  09/02/2021  7:40 AM [facility] DEVICE REMOTES [facility]  09/30/2021  1:20 PM [name], NP [place] None  12/02/2021  7:40 AM [facility] DEVICE REMOTES [facility]  03/03/2022  7:40 AM [facility] DEVICE REMOTES [facility]     ACTION: Home visit completed

## 2021-07-05 ENCOUNTER — Other Ambulatory Visit: Payer: Self-pay | Admitting: Pharmacy Technician

## 2021-07-05 ENCOUNTER — Other Ambulatory Visit: Payer: Self-pay

## 2021-07-05 ENCOUNTER — Encounter (HOSPITAL_COMMUNITY): Payer: Self-pay | Admitting: Internal Medicine

## 2021-07-05 LAB — CULTURE, BLOOD (ROUTINE X 2)
Culture: NO GROWTH
Culture: NO GROWTH
Special Requests: ADEQUATE
Special Requests: ADEQUATE

## 2021-07-05 MED ORDER — GLIPIZIDE 10 MG PO TABS
ORAL_TABLET | Freq: Two times a day (BID) | ORAL | 6 refills | Status: DC
  Filled 2021-07-05: qty 60, 30d supply, fill #0
  Filled 2021-08-02: qty 60, 30d supply, fill #1
  Filled 2021-09-13 – 2021-09-21 (×2): qty 60, 30d supply, fill #2
  Filled 2021-10-18 – 2021-10-25 (×2): qty 60, 30d supply, fill #3
  Filled 2021-11-15 – 2021-11-16 (×2): qty 60, 30d supply, fill #4

## 2021-07-06 ENCOUNTER — Telehealth (HOSPITAL_COMMUNITY): Payer: Self-pay

## 2021-07-06 NOTE — Telephone Encounter (Signed)
[name] called and inquired about upcoming appointments. I made him aware of same and reminded [facility]. Call complete.

## 2021-07-06 NOTE — Telephone Encounter (Signed)
Auth Submission: NO AUTH REQUIRED Payer: [employer] Medication & CPT/J Code(s) submitted: Venofer (Iron Sucrose) [id number] Route of submission (phone, fax, portal): PHONE Auth type: Buy/Bill Units/visits requested: 2 [id number]

## 2021-07-06 NOTE — Telephone Encounter (Signed)
Auth Submission: Denied Payer: [employer] Medication & CPT/J Code(s) submitted: Feraheme (ferumoxytol) [id number] Route of submission (phone, fax, portal): PHONE Auth type: Buy/Bill Units/visits requested: 1 Reference number: [id number]

## 2021-07-08 ENCOUNTER — Ambulatory Visit (INDEPENDENT_AMBULATORY_CARE_PROVIDER_SITE_OTHER): Payer: Medicare (Managed Care) | Admitting: *Deleted

## 2021-07-08 ENCOUNTER — Other Ambulatory Visit: Payer: Self-pay

## 2021-07-08 VITALS — BP 119/81 | HR 67 | Temp 98.7°F | Resp 20

## 2021-07-08 DIAGNOSIS — D62 Acute posthemorrhagic anemia: Secondary | ICD-10-CM

## 2021-07-08 DIAGNOSIS — K921 Melena: Secondary | ICD-10-CM

## 2021-07-08 DIAGNOSIS — K922 Gastrointestinal hemorrhage, unspecified: Secondary | ICD-10-CM | POA: Diagnosis not present

## 2021-07-08 DIAGNOSIS — D500 Iron deficiency anemia secondary to blood loss (chronic): Secondary | ICD-10-CM | POA: Diagnosis not present

## 2021-07-08 DIAGNOSIS — K31819 Angiodysplasia of stomach and duodenum without bleeding: Secondary | ICD-10-CM

## 2021-07-08 DIAGNOSIS — D649 Anemia, unspecified: Secondary | ICD-10-CM

## 2021-07-08 MED ORDER — SODIUM CHLORIDE 0.9 % IV SOLN
200.0000 mg | Freq: Every day | INTRAVENOUS | Status: DC
  Administered 2021-07-08: 200 mg via INTRAVENOUS
  Filled 2021-07-08: qty 10

## 2021-07-08 MED ORDER — DIPHENHYDRAMINE HCL 25 MG PO CAPS
50.0000 mg | ORAL_CAPSULE | Freq: Once | ORAL | Status: AC
  Administered 2021-07-08: 50 mg via ORAL

## 2021-07-08 MED ORDER — ACETAMINOPHEN 325 MG PO TABS
650.0000 mg | ORAL_TABLET | Freq: Once | ORAL | Status: AC
  Administered 2021-07-08: 650 mg via ORAL

## 2021-07-08 NOTE — Progress Notes (Signed)
Diagnosis: Iron Deficiency Anemia  Provider:  Marshell Garfinkel, MD  Procedure: Infusion  IV Type: Peripheral, IV Location: L Forearm  Venofer (Iron Sucrose), Dose: 200 mg  Infusion Start Time: 1157am  Infusion Stop Time: 1216pm  Post Infusion IV Care: Observation period completed  Discharge: Condition: Good, Destination: Home . AVS provided to patient.   Performed by:  Oren Beckmann, RN

## 2021-07-11 ENCOUNTER — Other Ambulatory Visit: Payer: Self-pay

## 2021-07-11 ENCOUNTER — Ambulatory Visit (INDEPENDENT_AMBULATORY_CARE_PROVIDER_SITE_OTHER): Payer: Medicare (Managed Care)

## 2021-07-11 ENCOUNTER — Other Ambulatory Visit (HOSPITAL_COMMUNITY): Payer: Self-pay

## 2021-07-11 VITALS — BP 153/94 | HR 76 | Temp 98.6°F | Resp 16

## 2021-07-11 DIAGNOSIS — D500 Iron deficiency anemia secondary to blood loss (chronic): Secondary | ICD-10-CM

## 2021-07-11 DIAGNOSIS — D649 Anemia, unspecified: Secondary | ICD-10-CM

## 2021-07-11 DIAGNOSIS — D62 Acute posthemorrhagic anemia: Secondary | ICD-10-CM

## 2021-07-11 DIAGNOSIS — K922 Gastrointestinal hemorrhage, unspecified: Secondary | ICD-10-CM | POA: Diagnosis not present

## 2021-07-11 DIAGNOSIS — D509 Iron deficiency anemia, unspecified: Secondary | ICD-10-CM | POA: Diagnosis not present

## 2021-07-11 DIAGNOSIS — K921 Melena: Secondary | ICD-10-CM | POA: Diagnosis not present

## 2021-07-11 DIAGNOSIS — K31819 Angiodysplasia of stomach and duodenum without bleeding: Secondary | ICD-10-CM

## 2021-07-11 MED ORDER — ACETAMINOPHEN 325 MG PO TABS
650.0000 mg | ORAL_TABLET | Freq: Once | ORAL | Status: AC
  Administered 2021-07-11: 650 mg via ORAL
  Filled 2021-07-11: qty 2

## 2021-07-11 MED ORDER — DIPHENHYDRAMINE HCL 25 MG PO CAPS
50.0000 mg | ORAL_CAPSULE | Freq: Once | ORAL | Status: AC
  Administered 2021-07-11: 50 mg via ORAL
  Filled 2021-07-11: qty 2

## 2021-07-11 MED ORDER — SODIUM CHLORIDE 0.9 % IV SOLN
200.0000 mg | Freq: Every day | INTRAVENOUS | Status: DC
  Administered 2021-07-11: 200 mg via INTRAVENOUS
  Filled 2021-07-11: qty 10

## 2021-07-11 MED FILL — Gabapentin Cap 300 MG: ORAL | 30 days supply | Qty: 180 | Fill #4 | Status: AC

## 2021-07-11 NOTE — Progress Notes (Signed)
Paramedicine Encounter    Patient ID: [name], male    DOB: 04/03/1961, 60 y.o.   MRN: [id number]   Patient Care Team: Pcp, No as PCP - General Turner, Traci R, MD as PCP - Cardiology (Cardiology) McLean, [name] S, MD as PCP - Advanced Heart Failure (Cardiology) Lambert, Cameron T, MD as PCP - Electrophysiology (Cardiology) Uris, Jenna H, LCSW as Social Worker (Licensed Clinical Social Worker) Beavers, [name], MD as Consulting Physician (Gastroenterology)  Patient Active Problem List   Diagnosis Date Noted   Melena    Chronic diastolic CHF (congestive heart failure) (HCC)    GI bleed 06/29/2021   Severe anemia 06/29/2021   Adenomatous polyp of ascending colon    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   Acute blood loss anemia    Angiodysplasia of stomach    Chronic anticoagulation    CHF exacerbation (HCC) 06/15/2020   AKI (acute kidney injury) (HCC) 06/15/2020   CKD (chronic kidney disease), stage III (HCC) 06/15/2020   Anemia due to chronic blood loss    Gastric AVM    Angiodysplasia of duodenum with hemorrhage    Acute on chronic systolic (congestive) heart failure (HCC) 04/05/2020   Anemia 04/05/2020   PAF (paroxysmal atrial fibrillation) (HCC) 04/05/2020   OSA on CPAP 04/05/2020   Syncope and collapse 04/05/2020   Type 2 diabetes mellitus with hyperglycemia, with long-term current use of insulin (HCC) 12/03/2019   Diabetic polyneuropathy associated with type 2 diabetes mellitus (HCC) 12/03/2019   Erectile dysfunction 12/03/2019   CHF (congestive heart failure) (HCC) 10/11/2019   Paranoid schizophrenia, chronic condition (HCC) 01/26/2015   Severe recurrent major depressive disorder with psychotic features (HCC) 01/26/2015   GAD (generalized anxiety disorder) 01/26/2015   OCD (obsessive compulsive disorder) 01/26/2015   Panic disorder without agoraphobia 01/26/2015   Insomnia 01/26/2015    Current Outpatient Medications:     acetaminophen (TYLENOL) 500 MG tablet, Take 2 tablets (1,000 mg total) by mouth 2 (two) times daily as needed., Disp: 180 tablet, Rfl: 3   albuterol (VENTOLIN HFA) 108 (90 Base) MCG/ACT inhaler, INHALE 2 PUFFS INTO THE LUNGS EVERY 6 (SIX) HOURS AS NEEDED FOR WHEEZING OR SHORTNESS OF BREATH., Disp: 18 g, Rfl: 3   apixaban (ELIQUIS) 5 MG TABS tablet, Take 1 tablet (5 mg total) by mouth 2 (two) times daily., Disp: 180 tablet, Rfl: 3   atorvastatin (LIPITOR) 40 MG tablet, Take 1 tablet (40 mg total) by mouth daily at 6 PM., Disp: 90 tablet, Rfl: 3   blood glucose meter kit and supplies KIT, Dispense based on patient and insurance preference. Use up to four times daily as directed. (FOR ICD-9 250.00, 250.01)., Disp: 1 each, Rfl: 0   carvedilol (COREG) 6.25 MG tablet, Take 1 tablet (6.25 mg total) by mouth 2 (two) times daily with a meal., Disp: 60 tablet, Rfl: 3   Continuous Blood Gluc Receiver (FREESTYLE LIBRE 14 DAY READER) DEVI, 1 each by Does not apply route as needed., Disp: 1 each, Rfl: 11   cyclobenzaprine (FLEXERIL) 10 MG tablet, Take 10 mg by mouth 3 (three) times daily as needed for muscle spasms., Disp: , Rfl:    dapagliflozin propanediol (FARXIGA) 10 MG TABS tablet, Take 1 tablet (10 mg total) by mouth daily before breakfast., Disp: 30 tablet, Rfl: 11   ferrous sulfate 325 (65 FE) MG tablet, Take 1 tablet (325 mg total) by mouth every other day., Disp: 30 tablet, Rfl: 0   furosemide (LASIX) 40 MG tablet, Take   1.5 tablets (60 mg total) by mouth 2 (two) times daily., Disp: 90 tablet, Rfl: 3   gabapentin (NEURONTIN) 300 MG capsule, TAKE 2 CAPSULES (TOTAL = 600 MG), BY MOUTH, 3 TIMES A DAY., Disp: 180 capsule, Rfl: 11   glipiZIDE (GLUCOTROL) 10 MG tablet, TAKE 1 TABLET (10 MG TOTAL) BY MOUTH 2 (TWO) TIMES DAILY BEFORE A MEAL., Disp: 60 tablet, Rfl: 6   insulin glargine (LANTUS) 100 UNIT/ML injection, INJECT 0.2 MLS (20 UNITS TOTAL) INTO THE SKIN DAILY., Disp: 30 mL, Rfl: 3   Insulin Lispro Prot &  Lispro (HUMALOG MIX 75/25 KWIKPEN) (75-25) 100 UNIT/ML Kwikpen, Inject 30 Units into the skin 2 (two) times daily., Disp: 15 mL, Rfl: 11   Insulin Pen Needle 31G X 6 MM MISC, USE AS DIRECTED, Disp: 100 each, Rfl: 0   Insulin Syringe-Needle U-100 31G X 5/16" 0.3 ML MISC, USE 3 TIMES DAILY TO INJECT INSULIN, Disp: 300 each, Rfl: 3   metFORMIN (GLUCOPHAGE) 500 MG tablet, TAKE 1 TABLET (500 MG TOTAL) BY MOUTH 2 (TWO) TIMES DAILY WITH A MEAL., Disp: 60 tablet, Rfl: 6   Multiple Vitamins-Minerals (CENTRUM SILVER 50+MEN) TABS, Take 1 tablet by mouth daily., Disp: , Rfl:    pantoprazole (PROTONIX) 40 MG tablet, Take 1 tablet (40 mg total) by mouth daily., Disp: 30 tablet, Rfl: 0   potassium chloride SA (KLOR-CON) 20 MEQ tablet, Take 2 tablets (40 mEq total) by mouth daily., Disp: 60 tablet, Rfl: 3   QUEtiapine (SEROQUEL) 300 MG tablet, Take 1 & 1/2 tablet by mouth daily., Disp: 45 tablet, Rfl: 0   sacubitril-valsartan (ENTRESTO) 24-26 MG, Take 1 tablet by mouth 2 (two) times daily., Disp: 180 tablet, Rfl: 3   spironolactone (ALDACTONE) 25 MG tablet, Take 0.5 tablets (12.5 mg total) by mouth at bedtime., Disp: 45 tablet, Rfl: 3   tetrahydrozoline 0.05 % ophthalmic solution, Place 1 drop into both eyes daily., Disp: , Rfl:    TRUEPLUS PEN NEEDLES 31G X 6 MM MISC, USE AS DIRECTED, Disp: 100 each, Rfl: 0 No Known Allergies   Social History   Socioeconomic History   Marital status: Legally Separated    Spouse name: Not on file   Number of children: Not on file   Years of education: Not on file   Highest education level: Not on file  Occupational History   Not on file  Tobacco Use   Smoking status: Former   Smokeless tobacco: Never  Vaping Use   Vaping Use: Never used  Substance and Sexual Activity   Alcohol use: Not Currently    Alcohol/week: 2.0 standard drinks    Types: 2 Cans of beer per week   Drug use: No   Sexual activity: Yes  Other Topics Concern   Not on file  Social History  Narrative   Not on file   Social Determinants of Health   Financial Resource Strain: Not on file  Food Insecurity: No Food Insecurity   Worried About Charity fundraiser in the Last Year: Never true   Ran Out of Food in the Last Year: Never true  Transportation Needs: Unmet Transportation Needs   Lack of Transportation (Medical): Yes   Lack of Transportation (Non-Medical): Yes  Physical Activity: Not on file  Stress: Not on file  Social Connections: Not on file  Intimate Partner Violence: Not on file    Physical Exam Vitals reviewed.  Constitutional:      Appearance: Normal appearance. He is normal weight.  HENT:  Head: Normocephalic.     Nose: Nose normal.     Mouth/Throat:     Mouth: Mucous membranes are moist.     Pharynx: Oropharynx is clear.  Eyes:     Conjunctiva/sclera: Conjunctivae normal.     Pupils: Pupils are equal, round, and reactive to light.  Cardiovascular:     Rate and Rhythm: Normal rate and regular rhythm.  Pulmonary:     Effort: Pulmonary effort is normal.     Breath sounds: Normal breath sounds.  Abdominal:     General: Abdomen is flat.     Palpations: Abdomen is soft.  Musculoskeletal:        General: No swelling. Normal range of motion.     Cervical back: Normal range of motion.     Right lower leg: No edema.     Left lower leg: No edema.  Skin:    General: Skin is warm and dry.     Capillary Refill: Capillary refill takes less than 2 seconds.  Neurological:     General: No focal deficit present.     Mental Status: He is alert. Mental status is at baseline.  Psychiatric:        Mood and Affect: Mood normal.    Arrived for home visit for Chris who was alert and oriented reporting to be feeling good today. Chris was compliant with his medications over the last week. I reviewed notes, medications and confirmed same. Pill box filled accordingly. Vitals and assessment as noted. Chris denied swelling, chest pain, shortness of breath, blood in  stool, dizziness or pain. Chris was reminded of upcoming appointments and agreed with same. [facility].   Chris will be getting out of pocked expense paperwork from pharmacy this week for us to submit his eliquis assistance.    Home visit complete. I will see Chris in one week.   Refills: Potassium Pantoprazole   Future Appointments  Date Time Provider Department Center  07/15/2021  2:30 PM [facility] PA/NP [facility] None  07/20/2021  2:00 PM [facility] PA/NP [facility] None  08/03/2021  1:50 PM Wilson, Amelia, MD [facility] None  09/02/2021  7:40 AM [facility] DEVICE REMOTES [facility]  09/30/2021  1:20 PM King, [name] M, NP [facility] None  12/02/2021  7:40 AM [facility] DEVICE REMOTES [facility]  03/03/2022  7:40 AM [facility] DEVICE REMOTES [facility]     ACTION: Home visit completed       

## 2021-07-11 NOTE — Progress Notes (Signed)
Diagnosis: Iron Deficiency Anemia  Provider:  Marshell Garfinkel, MD  Procedure: Infusion  IV Type: Peripheral, IV Location: L Forearm  Venofer (Iron Sucrose), Dose: 200 mg  Infusion Start Time: 11.48  07/11/2021  Infusion Stop Time: 12.33  07/11/2021  Post Infusion IV Care: Observation period completed and Peripheral IV Discontinued  Discharge: Condition: Good, Destination: Home . AVS provided to patient.   Performed by:  Arnoldo Morale, RN

## 2021-07-14 ENCOUNTER — Other Ambulatory Visit: Payer: Self-pay

## 2021-07-14 ENCOUNTER — Other Ambulatory Visit: Payer: Self-pay | Admitting: Nurse Practitioner

## 2021-07-14 MED ORDER — PANTOPRAZOLE SODIUM 40 MG PO TBEC
40.0000 mg | DELAYED_RELEASE_TABLET | Freq: Every day | ORAL | 0 refills | Status: DC
  Filled 2021-07-14 – 2021-09-05 (×2): qty 30, 30d supply, fill #0

## 2021-07-15 ENCOUNTER — Encounter (HOSPITAL_COMMUNITY): Payer: Medicare (Managed Care)

## 2021-07-19 NOTE — Progress Notes (Signed)
ADVANCED HEART FAILURE CLINIC NOTE  PCP: Pcp, No Cardiology: Dr. Radford Pax HF Cardiology: Dr. Aundra Dubin  60 y.o. with history of chronic systolic CHF, CAD, type 2 diabetes, paroxysmal atrial fibrillation, and schizophrenia was referred by Dr. Radford Pax for evaluation of CHF.  Patient had OM2 PCI in 2012.  In 11/20, he was admitted with CHF. Echo showed EF 25-30% with diffuse hypokinesis.  LHC was done, showing occluded OM2 at prior stent, 90% D1 stenosis, and extensive diffuse RCA disease.  No intervention.  He was thought to be in paroxysmal atrial fibrillation during this appointment and apixaban was started.   He does not smoke, rarely drinks, and does not use drugs.  His mother had "heart problems."    He can write his name only. He is only able to read a few words. Thinks he completed the 7th grade.    Echo in 5/21 showed EF 30% with diffuse hypokinesis, mild LVH, PASP 38, mildly decreased RV systolic function, IVC dilated. Biotronik ICD placed.   He was hospitalized in 7/21 with upper GI bleeding and CHF exacerbation.  EGD showed duodenal AVMs, treated with APC. He was diuresed.   Clinic visit 5/22 he was volume overloaded, weight up 19 lbs, and diuretics increased and Farxiga started.   He was admitted to [facility] a couple weeks later (04/13/21) for a/c CHF. He was diuresed with IV lasix. Hospitalization complicated by GIB & AKI. Enteroscopy performed on 5/20 which showed 2 small angioectasias with no bleeding in the third portion of the duodenum. Colonoscopy on 5/21, showed multiple polyps which were biopsied. No source of bleeding found. He had pill endoscopy 5/22 which showed non bleeding AVM. Pt had repeat enteroscopy which showed non bleeding jejunal ulcer. Eliquis held during admission but restarted at discharge. Home hydralazine, spiro, Entresto, and Imdur stopped in setting of GIB; carvedilol dose decreased at discharge.  He returned 6/22 for post hospitalization HF follow up. Up 10 lbs per  paramedicine. NYHA II symptoms. Entresto restarted and lasix increased.  Admitted 7/22 for chest pain, found to have hgb of 4.7. He received 2 U PRBCs, Eliquis was held and GI & AHF were consulted for further management. Farxiga and spiro held with increase in kidney function. Repeat echo on 06/30/21 EF 50-55%, mildly dilated LV, mild LVH, RV okay, dilated IVC with estimated RA pressure of 8. He underwent EGD on 07/01/21 showing three non-bleeding angioectasias in the duodenum. Treated with argon plasma coagulation (APC). GDMT therapy added back as SCr improved. Eliquis restarted and arrangement made for referral for Watchman device consideration. Day of discharge weight 324 lbs, SCr 1.29 and hgb 8.3.   Today he returns for post hospitalization HF follow up. He is being followed by Paramedicine. Overall feeling fine. No significant exertional dyspnea, however he avoids stairs. Denies CP, dizziness, edema, or PND/Orthopnea. Appetite ok. No fever or chills. Weight at home 320 pounds. He has not taken any of his mediations in 2 days.  ECG (personally reviewed): SR  Labs (1/21): LDL 66, HDL 42, hgb 11.3, K 4.7, creatinine 1.32 Labs (4/21): K 5, creatinine 1.37 Labs (7/21): K 4.1, creatinine 1.26, hgb 9.3 Labs (8/21): LDL 51, K 3.8, creatinine 1.3 Labs (11/21): K 4, creatinine 1.42, hgb 13.1, hgbA1c 11.7 Labs (2/22): HgbA1c 7.7 Labs (3/22): K 4.4, creatinine 1.27, pro-BNP 249. Labs (5/22): K 4.4, creatinine 1.22 Labs (8/22): K 4.1, creatinine 1.00, hgb 8.3  PMH:  1. Atrial fibrillation: Paroxysmal 2. Type 2 diabetes 3. HTN 4. Hyperlipidemia 5. Schizophrenia  6. CAD: PCI OM2 in 2012.  - LHC (11/20): 90% D1 stenosis, totally occluded OM2 at stent, serial 85%/70%/60% RCA stenoses.  7. 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  Biotronik ICD.  - Echo (11/20): EF 25-30%, global hypokinesis.  - Echo (5/21): EF 30% with diffuse hypokinesis, mild LVH, PASP 38, mildly  decreased RV systolic function, IVC dilated. 8. Upper GI bleeding: 7/21, duodenal AVMs treated with APC.  9. OSA: Does not use CPAP regularly.   Social History   Socioeconomic History   Marital status: Legally Separated    Spouse name: Not on file   Number of children: Not on file   Years of education: Not on file   Highest education level: Not on file  Occupational History   Not on file  Tobacco Use   Smoking status: Former   Smokeless tobacco: Never  Vaping Use   Vaping Use: Never used  Substance and Sexual Activity   Alcohol use: Not Currently    Alcohol/week: 2.0 standard drinks    Types: 2 Cans of beer per week   Drug use: No   Sexual activity: Yes  Other Topics Concern   Not on file  Social History Narrative   Not on file   Social Determinants of Health   Financial Resource Strain: Not on file  Food Insecurity: No Food Insecurity   Worried About Charity fundraiser in the Last Year: Never true   Ran Out of Food in the Last Year: Never true  Transportation Needs: Unmet Transportation Needs   Lack of Transportation (Medical): Yes   Lack of Transportation (Non-Medical): Yes  Physical Activity: Not on file  Stress: Not on file  Social Connections: Not on file  Intimate Partner Violence: Not on file   Family History  Problem Relation Age of Onset   Heart failure Mother    Mental illness Sister    Mental illness Sister    ROS: All systems reviewed and negative except as per HPI.  No outpatient medications have been marked as taking for the 07/20/21 encounter [facility] Encounter) with [facility] PA/NP.   BP 138/80   Pulse 68   Wt (!) 146 kg (321 lb 12.8 oz)   SpO2 96%   BMI 41.32 kg/m   Wt Readings from Last 3 Encounters:  07/20/21 (!) 146 kg (321 lb 12.8 oz)  07/11/21 (!) 147 kg (324 lb)  07/02/21 (!) 151 kg (332 lb 14.3 oz)   Current Outpatient Medications on File Prior to Encounter  Medication Sig Dispense Refill   acetaminophen (TYLENOL) 500 MG tablet  Take 2 tablets (1,000 mg total) by mouth 2 (two) times daily as needed. 180 tablet 3   albuterol (VENTOLIN HFA) 108 (90 Base) MCG/ACT inhaler INHALE 2 PUFFS INTO THE LUNGS EVERY 6 (SIX) HOURS AS NEEDED FOR WHEEZING OR SHORTNESS OF BREATH. 18 g 3   apixaban (ELIQUIS) 5 MG TABS tablet Take 1 tablet (5 mg total) by mouth 2 (two) times daily. 180 tablet 3   atorvastatin (LIPITOR) 40 MG tablet Take 1 tablet (40 mg total) by mouth daily at 6 PM. 90 tablet 3   blood glucose meter kit and supplies KIT Dispense based on patient and insurance preference. Use up to four times daily as directed. (FOR ICD-9 250.00, 250.01). 1 each 0   carvedilol (COREG) 6.25 MG tablet Take 1 tablet (6.25 mg total) by mouth 2 (two) times daily with a meal. 60 tablet 3   Continuous Blood Gluc Receiver (FREESTYLE  LIBRE 14 DAY READER) DEVI 1 each by Does not apply route as needed. 1 each 11   cyclobenzaprine (FLEXERIL) 10 MG tablet Take 10 mg by mouth 3 (three) times daily as needed for muscle spasms.     dapagliflozin propanediol (FARXIGA) 10 MG TABS tablet Take 1 tablet (10 mg total) by mouth daily before breakfast. 30 tablet 11   ferrous sulfate 325 (65 FE) MG tablet Take 1 tablet (325 mg total) by mouth every other day. 30 tablet 0   furosemide (LASIX) 40 MG tablet Take 1.5 tablets (60 mg total) by mouth 2 (two) times daily. 90 tablet 3   gabapentin (NEURONTIN) 300 MG capsule TAKE 2 CAPSULES (TOTAL = 600 MG), BY MOUTH, 3 TIMES A DAY. 180 capsule 11   glipiZIDE (GLUCOTROL) 10 MG tablet TAKE 1 TABLET (10 MG TOTAL) BY MOUTH 2 (TWO) TIMES DAILY BEFORE A MEAL. 60 tablet 6   insulin glargine (LANTUS) 100 UNIT/ML injection INJECT 0.2 MLS (20 UNITS TOTAL) INTO THE SKIN DAILY. 30 mL 3   Insulin Lispro Prot & Lispro (HUMALOG MIX 75/25 KWIKPEN) (75-25) 100 UNIT/ML Kwikpen Inject 30 Units into the skin 2 (two) times daily. 15 mL 11   Insulin Pen Needle 31G X 6 MM MISC USE AS DIRECTED 100 each 0   Insulin Syringe-Needle U-100 31G X 5/16" 0.3  ML MISC USE 3 TIMES DAILY TO INJECT INSULIN 300 each 3   metFORMIN (GLUCOPHAGE) 500 MG tablet TAKE 1 TABLET (500 MG TOTAL) BY MOUTH 2 (TWO) TIMES DAILY WITH A MEAL. 60 tablet 6   Multiple Vitamins-Minerals (CENTRUM SILVER 50+MEN) TABS Take 1 tablet by mouth daily.     pantoprazole (PROTONIX) 40 MG tablet Take 1 tablet (40 mg total) by mouth daily. 30 tablet 0   potassium chloride SA (KLOR-CON) 20 MEQ tablet Take 2 tablets (40 mEq total) by mouth daily. 60 tablet 3   sacubitril-valsartan (ENTRESTO) 24-26 MG Take 1 tablet by mouth 2 (two) times daily. 180 tablet 3   spironolactone (ALDACTONE) 25 MG tablet Take 0.5 tablets (12.5 mg total) by mouth at bedtime. 45 tablet 3   tetrahydrozoline 0.05 % ophthalmic solution Place 1 drop into both eyes daily.     TRUEPLUS PEN NEEDLES 31G X 6 MM MISC USE AS DIRECTED 100 each 0   No current facility-administered medications on file prior to encounter.   BP 138/80   Pulse 68   Wt (!) 146 kg (321 lb 12.8 oz)   SpO2 96%   BMI 41.32 kg/m   Wt Readings from Last 3 Encounters:  07/20/21 (!) 146 kg (321 lb 12.8 oz)  07/11/21 (!) 147 kg (324 lb)  07/02/21 (!) 151 kg (332 lb 14.3 oz)   Physical Exam: General:  NAD. No resp difficulty HEENT: Normal Neck: Supple. No JVD. Carotids 2+ bilat; no bruits. No lymphadenopathy or thryomegaly appreciated. Cor: PMI nondisplaced. Regular rate & rhythm. No rubs, gallops or murmurs. Lungs: Clear Abdomen: Obese, nontender, nondistended. No hepatosplenomegaly. No bruits or masses. Good bowel sounds. Extremities: No cyanosis, clubbing, rash, 1+ LE edema Neuro: Alert & oriented x 3, cranial nerves grossly intact. Moves all 4 extremities w/o difficulty. Affect pleasant.  Assessment/Plan: 1. Chronic systolic CHF with recovery in LV function: Echo in 11/20 with EF 25-30%, echo 5/21 with EF 30% with mildly decreased RV systolic function. Biotronik ICD.  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  He tells me  today that he is not longer drinking ETOH. Echo 8/22 with improvement in LVEF to  50-55%. NYHA class II-early III. He is not volume overloaded today.  - Continue lasix 60 mg bid. BMET/BNP today. - Continue Coreg 6.25 mg bid.   - Continue Entresto 24/26 mg bid - Continue spiro 12.5 mg daily  - Continue Farxiga 10 mg daily. 2. 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 Enteroscopy 8/5 with mild gastritis, three non bleeding angioectasias in duodenum treated with APC and 1 clip. - Iron deficient, continue iron suppl. - Continue PPI. Will refill this Rx today. - Previously reports regular use of Excedrin, now only using Tylenol. - He has not used ETOH. - CBC today. 3. Paroxysmal atrial fibrillation: Remains in NSR on ECG today. CHADS2-VASc score = 4 (CAD, CHF, HTN, DM).  D/t recurrent GI bleeding, will arrange referral to discuss left atrial appendage occlusion with watchman device. - If not a candidate for watchman, will need to weigh risks/benefits of long-term anticoagulation. Discussed importance of avoiding NSAIDs and alcohol in the future. 4. CAD: Last cath 11/20220 with occluded OM2 stent, 90% small OM1, diffuse disease RCA treated medically.  Slight ST depression in inferolateral leads in territory of known disease. - Currently no s/s angina. - On statin.  - No ASA w/ Eliquis. 5. HTN: Stable.  6. OSA: CPAP machine dated and does not work. Updated sleep study has been ordered. 7. CKD 3a: BMET today.  I personally filled his pill boxes today. Refilled PPI and KCl supp.  Follow up in 2-3 months with Dr. Aundra Dubin.  Allena Katz, FNP-BC 07/20/21

## 2021-07-19 NOTE — Telephone Encounter (Signed)
Called to confirm/remind patient of their appointment at the [facility] on 07/20/21.   Patient reminded to bring all medications and/or complete list.  Confirmed patient has transportation. Gave directions, instructed to utilize [place] parking.  Confirmed appointment prior to ending call.

## 2021-07-20 ENCOUNTER — Other Ambulatory Visit: Payer: Self-pay

## 2021-07-20 ENCOUNTER — Ambulatory Visit (HOSPITAL_COMMUNITY)
Admission: RE | Admit: 2021-07-20 | Discharge: 2021-07-20 | Disposition: A | Discharge status: Home / Self Care | Payer: Medicare (Managed Care) | Source: Ambulatory Visit | Attending: Family Medicine | Admitting: Family Medicine

## 2021-07-20 ENCOUNTER — Encounter (HOSPITAL_COMMUNITY): Payer: Self-pay

## 2021-07-20 VITALS — BP 138/80 | HR 68 | Wt 321.8 lb

## 2021-07-20 DIAGNOSIS — N1831 Chronic kidney disease, stage 3a: Secondary | ICD-10-CM

## 2021-07-20 DIAGNOSIS — I2510 Atherosclerotic heart disease of native coronary artery without angina pectoris: Secondary | ICD-10-CM

## 2021-07-20 DIAGNOSIS — E1122 Type 2 diabetes mellitus with diabetic chronic kidney disease: Secondary | ICD-10-CM | POA: Diagnosis not present

## 2021-07-20 DIAGNOSIS — Z87891 Personal history of nicotine dependence: Secondary | ICD-10-CM | POA: Insufficient documentation

## 2021-07-20 DIAGNOSIS — E785 Hyperlipidemia, unspecified: Secondary | ICD-10-CM | POA: Insufficient documentation

## 2021-07-20 DIAGNOSIS — I5032 Chronic diastolic (congestive) heart failure: Secondary | ICD-10-CM | POA: Diagnosis not present

## 2021-07-20 DIAGNOSIS — Z8719 Personal history of other diseases of the digestive system: Secondary | ICD-10-CM | POA: Diagnosis not present

## 2021-07-20 DIAGNOSIS — Z7901 Long term (current) use of anticoagulants: Secondary | ICD-10-CM | POA: Diagnosis not present

## 2021-07-20 DIAGNOSIS — Z955 Presence of coronary angioplasty implant and graft: Secondary | ICD-10-CM | POA: Insufficient documentation

## 2021-07-20 DIAGNOSIS — I5022 Chronic systolic (congestive) heart failure: Secondary | ICD-10-CM | POA: Diagnosis present

## 2021-07-20 DIAGNOSIS — G4733 Obstructive sleep apnea (adult) (pediatric): Secondary | ICD-10-CM

## 2021-07-20 DIAGNOSIS — I10 Essential (primary) hypertension: Secondary | ICD-10-CM

## 2021-07-20 DIAGNOSIS — Z794 Long term (current) use of insulin: Secondary | ICD-10-CM | POA: Diagnosis not present

## 2021-07-20 DIAGNOSIS — I480 Paroxysmal atrial fibrillation: Secondary | ICD-10-CM | POA: Diagnosis not present

## 2021-07-20 DIAGNOSIS — I130 Hypertensive heart and chronic kidney disease with heart failure and stage 1 through stage 4 chronic kidney disease, or unspecified chronic kidney disease: Secondary | ICD-10-CM | POA: Insufficient documentation

## 2021-07-20 DIAGNOSIS — Z8249 Family history of ischemic heart disease and other diseases of the circulatory system: Secondary | ICD-10-CM | POA: Diagnosis not present

## 2021-07-20 DIAGNOSIS — Z79899 Other long term (current) drug therapy: Secondary | ICD-10-CM | POA: Diagnosis not present

## 2021-07-20 LAB — BASIC METABOLIC PANEL
Anion gap: 5 (ref 5–15)
BUN: 17 mg/dL (ref 8–23)
CO2: 24 mmol/L (ref 22–32)
Calcium: 8.5 mg/dL — ABNORMAL LOW (ref 8.9–10.3)
Chloride: 106 mmol/L (ref 98–111)
Creatinine, Ser: 1.1 mg/dL (ref 0.61–1.24)
GFR, Estimated: >60 mL/min (ref >60)
Glucose, Bld: 139 mg/dL — ABNORMAL HIGH (ref 70–99)
Potassium: 4 mmol/L (ref 3.5–5.1)
Sodium: 135 mmol/L (ref 135–145)

## 2021-07-20 LAB — CBC
HCT: 41.4 % (ref 39.0–52.0)
Hemoglobin: 11.8 g/dL — ABNORMAL LOW (ref 13.0–17.0)
MCH: 23.4 pg — ABNORMAL LOW (ref 26.0–34.0)
MCHC: 28.5 g/dL — ABNORMAL LOW (ref 30.0–36.0)
MCV: 82 fL (ref 80.0–100.0)
Platelets: 434 10*3/uL — ABNORMAL HIGH (ref 150–400)
RBC: 5.05 MIL/uL (ref 4.22–5.81)
RDW: 20.4 % — ABNORMAL HIGH (ref 11.5–15.5)
WBC: 6.6 10*3/uL (ref 4.0–10.5)
nRBC: 0 % (ref 0.0–0.2)

## 2021-07-20 LAB — BRAIN NATRIURETIC PEPTIDE: B Natriuretic Peptide: 103.1 pg/mL — ABNORMAL HIGH (ref 0.0–100.0)

## 2021-07-20 MED ORDER — QUETIAPINE FUMARATE 300 MG PO TABS
ORAL_TABLET | ORAL | 0 refills | Status: DC
  Filled 2021-07-20: qty 45, 30d supply, fill #0

## 2021-07-20 MED ORDER — POTASSIUM CHLORIDE CRYS ER 20 MEQ PO TBCR
40.0000 meq | EXTENDED_RELEASE_TABLET | Freq: Every day | ORAL | 3 refills | Status: DC
  Filled 2021-08-16: qty 60, 30d supply, fill #0
  Filled 2021-09-13 – 2021-09-21 (×2): qty 60, 30d supply, fill #1
  Filled 2021-10-18 – 2021-10-25 (×2): qty 60, 30d supply, fill #2
  Filled 2021-11-15 – 2021-11-16 (×2): qty 60, 30d supply, fill #3

## 2021-07-20 MED ORDER — PANTOPRAZOLE SODIUM 40 MG PO TBEC
40.0000 mg | DELAYED_RELEASE_TABLET | Freq: Every day | ORAL | 3 refills | Status: DC
  Filled 2021-07-20 – 2021-08-03 (×2): qty 30, 30d supply, fill #0

## 2021-07-20 NOTE — Patient Instructions (Signed)
It was great to see you today! No medication changes are needed at this time. -we did refill your protonix and potassium, they should be ready for pick up shortly  Labs today We will only contact you if something comes back abnormal or we need to make some changes. Otherwise no news is good news!  You have been referred to [facility] -they will be in contact with an appointment  Your physician recommends that you schedule a follow-up appointment in: 8 weeks with Dr Aundra Dubin  Do the following things EVERYDAY: Weigh yourself in the morning before breakfast. Write it down and keep it in a log. Take your medicines as prescribed Eat low salt foods--Limit salt (sodium) to 2000 mg per day.  Stay as active as you can everyday Limit all fluids for the day to less than 2 liters  At the [facility], you and your health needs are our priority. As part of our continuing mission to provide you with exceptional heart care, we have created designated Provider Care Teams. These Care Teams include your primary Cardiologist (physician) and Advanced Practice Providers (APPs- Physician Assistants and Nurse Practitioners) who all work together to provide you with the care you need, when you need it.   You may see any of the following providers on your designated Care Team at your next follow up: Dr Glori Bickers Dr Loralie Champagne Dr Patrice Paradise, NP Lyda Jester, [place] Ginnie Smart [name], PharmD   Please be sure to bring in all your medications bottles to every appointment.

## 2021-07-21 ENCOUNTER — Other Ambulatory Visit: Payer: Self-pay

## 2021-07-26 ENCOUNTER — Other Ambulatory Visit: Payer: Self-pay

## 2021-07-26 MED ORDER — CHLORHEXIDINE GLUCONATE 0.12 % MT SOLN
OROMUCOSAL | 0 refills | Status: DC
  Filled 2021-07-26: qty 473, 7d supply, fill #0

## 2021-07-26 MED ORDER — TRAMADOL HCL 50 MG PO TABS
ORAL_TABLET | ORAL | 0 refills | Status: DC
  Filled 2021-07-26: qty 12, 2d supply, fill #0

## 2021-07-27 ENCOUNTER — Encounter (HOSPITAL_COMMUNITY): Payer: Self-pay | Admitting: Adult Health

## 2021-07-27 ENCOUNTER — Other Ambulatory Visit: Payer: Self-pay

## 2021-07-27 ENCOUNTER — Other Ambulatory Visit (HOSPITAL_COMMUNITY): Payer: Self-pay

## 2021-07-27 NOTE — Progress Notes (Addendum)
HF Paramedicine Team Based Care Meeting  HF MD- NA  HF NP - [place] NP-C   [place] [facility] admit within the last 30 days for heart failure? Yes 06/29/2021 NSTEMI GI bleed  Medications concerns? Can not fill pill boxes. Eventually bubble packs.   Transportation issues ? Not driving. Uses [facility] transport.   Education needs? Medcations.   SDOH : Illiterate can read just few words/. Has housing. ETOH/ Drug. Attends  [employer] weekly. Trying to get disability.   Eligible for discharge? No    Joel Tootle NP-C  3:36 PM

## 2021-07-27 NOTE — Progress Notes (Signed)
Paramedicine Encounter    Patient ID: [name], male    DOB: Apr 01, 1961, 60 y.o.   MRN: [id number]   Patient Care Team: Pcp, No as PCP - General [name], MD as PCP - Cardiology (Cardiology) [name], MD as PCP - Advanced Heart Failure (Cardiology) [name], MD as PCP - Electrophysiology (Cardiology) [name], LCSW as Social Worker (Licensed Clinical Social Worker) [name], MD as Consulting Physician (Gastroenterology)  Patient Active Problem List   Diagnosis Date Noted   Melena    Chronic diastolic CHF (congestive heart failure) ([place])    GI bleed 06/29/2021   Severe anemia 06/29/2021   Adenomatous polyp of ascending colon    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   Acute blood loss anemia    Angiodysplasia of stomach    Chronic anticoagulation    CHF exacerbation ([place]) 06/15/2020   AKI (acute kidney injury) ([place]) 06/15/2020   CKD (chronic kidney disease), stage III ([place]) 06/15/2020   Anemia due to chronic blood loss    Gastric AVM    Angiodysplasia of duodenum with hemorrhage    Acute on chronic systolic (congestive) heart failure ([place]) 04/05/2020   Anemia 04/05/2020   PAF (paroxysmal atrial fibrillation) ([place]) 04/05/2020   OSA on CPAP 04/05/2020   Syncope and collapse 04/05/2020   Type 2 diabetes mellitus with hyperglycemia, with long-term current use of insulin ([place]) 12/03/2019   Diabetic polyneuropathy associated with type 2 diabetes mellitus ([place]) 12/03/2019   Erectile dysfunction 12/03/2019   CHF (congestive heart failure) ([place]) 10/11/2019   Paranoid schizophrenia, chronic condition ([place]) 01/26/2015   Severe recurrent major depressive disorder with psychotic features ([place]) 01/26/2015   GAD (generalized anxiety disorder) 01/26/2015   OCD (obsessive compulsive disorder) 01/26/2015   Panic disorder without agoraphobia 01/26/2015   Insomnia 01/26/2015    Current Outpatient Medications:     acetaminophen (TYLENOL) 500 MG tablet, Take 2 tablets (1,000 mg total) by mouth 2 (two) times daily as needed., Disp: 180 tablet, Rfl: 3   albuterol (VENTOLIN HFA) 108 (90 Base) MCG/ACT inhaler, INHALE 2 PUFFS INTO THE LUNGS EVERY 6 (SIX) HOURS AS NEEDED FOR WHEEZING OR SHORTNESS OF BREATH., Disp: 18 g, Rfl: 3   apixaban (ELIQUIS) 5 MG TABS tablet, Take 1 tablet (5 mg total) by mouth 2 (two) times daily., Disp: 180 tablet, Rfl: 3   atorvastatin (LIPITOR) 40 MG tablet, Take 1 tablet (40 mg total) by mouth daily at 6 PM., Disp: 90 tablet, Rfl: 3   blood glucose meter kit and supplies KIT, Dispense based on patient and insurance preference. Use up to four times daily as directed. (FOR ICD-9 250.00, 250.01)., Disp: 1 each, Rfl: 0   carvedilol (COREG) 6.25 MG tablet, Take 1 tablet (6.25 mg total) by mouth 2 (two) times daily with a meal., Disp: 60 tablet, Rfl: 3   chlorhexidine (PERIDEX) 0.12 % solution, GENTLY RINSE WITH 15 ML BY MOUTH FOR 2 MINUTES TWICE DAILY, EXPECTORATE AFTER USE, Disp: 473 mL, Rfl: 0   Continuous Blood Gluc Receiver (FREESTYLE LIBRE 14 DAY READER) DEVI, 1 each by Does not apply route as needed., Disp: 1 each, Rfl: 11   cyclobenzaprine (FLEXERIL) 10 MG tablet, Take 10 mg by mouth 3 (three) times daily as needed for muscle spasms., Disp: , Rfl:    dapagliflozin propanediol (FARXIGA) 10 MG TABS tablet, Take 1 tablet (10 mg total) by mouth daily before breakfast., Disp: 30 tablet, Rfl: 11   ferrous sulfate 325 (65  FE) MG tablet, Take 1 tablet (325 mg total) by mouth every other day., Disp: 30 tablet, Rfl: 0   furosemide (LASIX) 40 MG tablet, Take 1.5 tablets (60 mg total) by mouth 2 (two) times daily., Disp: 90 tablet, Rfl: 3   gabapentin (NEURONTIN) 300 MG capsule, TAKE 2 CAPSULES (TOTAL = 600 MG), BY MOUTH, 3 TIMES A DAY., Disp: 180 capsule, Rfl: 11   glipiZIDE (GLUCOTROL) 10 MG tablet, TAKE 1 TABLET (10 MG TOTAL) BY MOUTH 2 (TWO) TIMES DAILY BEFORE A MEAL., Disp: 60 tablet, Rfl: 6    insulin glargine (LANTUS) 100 UNIT/ML injection, INJECT 0.2 MLS (20 UNITS TOTAL) INTO THE SKIN DAILY., Disp: 30 mL, Rfl: 3   Insulin Lispro Prot & Lispro (HUMALOG MIX 75/25 KWIKPEN) (75-25) 100 UNIT/ML Kwikpen, Inject 30 Units into the skin 2 (two) times daily., Disp: 15 mL, Rfl: 11   Insulin Pen Needle 31G X 6 MM MISC, USE AS DIRECTED, Disp: 100 each, Rfl: 0   Insulin Syringe-Needle U-100 31G X 5/16" 0.3 ML MISC, USE 3 TIMES DAILY TO INJECT INSULIN, Disp: 300 each, Rfl: 3   metFORMIN (GLUCOPHAGE) 500 MG tablet, TAKE 1 TABLET (500 MG TOTAL) BY MOUTH 2 (TWO) TIMES DAILY WITH A MEAL., Disp: 60 tablet, Rfl: 6   Multiple Vitamins-Minerals (CENTRUM SILVER 50+MEN) TABS, Take 1 tablet by mouth daily., Disp: , Rfl:    pantoprazole (PROTONIX) 40 MG tablet, Take 1 tablet (40 mg total) by mouth daily., Disp: 30 tablet, Rfl: 0   pantoprazole (PROTONIX) 40 MG tablet, Take 1 tablet (40 mg total) by mouth daily., Disp: 30 tablet, Rfl: 3   potassium chloride SA (KLOR-CON) 20 MEQ tablet, Take 2 tablets (40 mEq total) by mouth daily., Disp: 60 tablet, Rfl: 3   potassium chloride SA (KLOR-CON) 20 MEQ tablet, Take 2 tablets (40 mEq total) by mouth daily., Disp: 60 tablet, Rfl: 3   QUEtiapine (SEROQUEL) 300 MG tablet, Take 1.5 tablets by mouth daily., Disp: 45 tablet, Rfl: 0   sacubitril-valsartan (ENTRESTO) 24-26 MG, Take 1 tablet by mouth 2 (two) times daily., Disp: 180 tablet, Rfl: 3   spironolactone (ALDACTONE) 25 MG tablet, Take 0.5 tablets (12.5 mg total) by mouth at bedtime., Disp: 45 tablet, Rfl: 3   tetrahydrozoline 0.05 % ophthalmic solution, Place 1 drop into both eyes daily., Disp: , Rfl:    traMADol (ULTRAM) 50 MG tablet, take 1 tab BY MOUTH EVERY 4-6 hOURS AS NEEDED FOR pain., Disp: 12 tablet, Rfl: 0   TRUEPLUS PEN NEEDLES 31G X 6 MM MISC, USE AS DIRECTED, Disp: 100 each, Rfl: 0 No Known Allergies   Social History   Socioeconomic History   Marital status: Legally Separated    Spouse name: Not on file    Number of children: Not on file   Years of education: Not on file   Highest education level: Not on file  Occupational History   Not on file  Tobacco Use   Smoking status: Former   Smokeless tobacco: Never  Vaping Use   Vaping Use: Never used  Substance and Sexual Activity   Alcohol use: Not Currently    Alcohol/week: 2.0 standard drinks    Types: 2 Cans of beer per week   Drug use: No   Sexual activity: Yes  Other Topics Concern   Not on file  Social History Narrative   Not on file   Social Determinants of Health   Financial Resource Strain: Not on file  Food Insecurity: No Food Insecurity  Worried About Charity fundraiser in the Last Year: Never true   [employer] of Food in the Last Year: Never true  Transportation Needs: Unmet Transportation Needs   Lack of Transportation (Medical): Yes   Lack of Transportation (Non-Medical): Yes  Physical Activity: Not on file  Stress: Not on file  Social Connections: Not on file  Intimate Partner Violence: Not on file    Physical Exam Vitals reviewed.  Constitutional:      Appearance: Normal appearance. He is normal weight.  HENT:     Head: Normocephalic.     Nose: Nose normal.     Mouth/Throat:     Mouth: Mucous membranes are moist.     Pharynx: Oropharynx is clear.  Eyes:     Conjunctiva/sclera: Conjunctivae normal.     Pupils: Pupils are equal, round, and reactive to light.  Cardiovascular:     Rate and Rhythm: Normal rate and regular rhythm.     Pulses: Normal pulses.     Heart sounds: Normal heart sounds.  Pulmonary:     Effort: Pulmonary effort is normal.     Breath sounds: Normal breath sounds.  Abdominal:     General: Abdomen is flat.     Palpations: Abdomen is soft.  Musculoskeletal:        General: No swelling. Normal range of motion.     Cervical back: Normal range of motion.     Right lower leg: No edema.     Left lower leg: No edema.  Skin:    General: Skin is warm and dry.     Capillary Refill:  Capillary refill takes less than 2 seconds.  Neurological:     General: No focal deficit present.     Mental Status: He is alert. Mental status is at baseline.  Psychiatric:        Mood and Affect: Mood normal.  Arrived for home visit for [name] who was alert and oriented reporting to be feeling okay other than some residual pain from having his teeth pulled yesterday. I reviewed post care instructions and he is following same accordingly. [name] has been complaint with his medications. I reviewed same and filled pill box accordingly.   We reviewed upcoming appointments and confirmed same. He agreed with plan and plans to use [facility] transport for same.   Assessment noted clear lung sounds, no edema or swelling noted. [name] reports no shortness of breath, dizziness or chest pain.   He stated that he is now his own payee and manages his own money. He is seeking housing elsewhere because his sister as the landlord is being over bearing. I gave him listing of local options for townhomes, apartments and website for local renting options.   [name] has four medications in which I called into [facility]. He will pick up same before our visit next week.   -Carvedilol -Atorvastatin -Potassium -Pantoprazole    CBG- 278 (after breakfast)  I will see [name] in one week. Home visit complete.    Future Appointments  Date Time Provider [place]  08/03/2021  1:50 PM Dorna Mai, MD [facility] None  09/02/2021  7:40 AM [facility] DEVICE REMOTES [facility]  09/09/2021  8:45 AM [name], MD [facility]  09/22/2021 11:00 AM [name], MD [facility] None  09/30/2021  1:20 PM [name], NP [place] None  12/02/2021  7:40 AM [facility] DEVICE REMOTES [facility]  03/03/2022  7:40 AM [facility] DEVICE REMOTES [facility]  [facility]     ACTION: Home visit completed

## 2021-07-29 ENCOUNTER — Telehealth (HOSPITAL_COMMUNITY): Payer: Self-pay

## 2021-07-29 NOTE — Telephone Encounter (Signed)
Called to request a refill and shipment of [place] for [place].   Unsuccessful attempt to reach [employer] rep. I will try again on Monday.

## 2021-08-02 ENCOUNTER — Other Ambulatory Visit (HOSPITAL_COMMUNITY): Payer: Self-pay

## 2021-08-02 ENCOUNTER — Other Ambulatory Visit: Payer: Self-pay

## 2021-08-02 NOTE — Progress Notes (Signed)
Paramedicine Encounter    Patient ID: [name], male    DOB: 1961/09/23, 60 y.o.   MRN: [id number]   Patient Care Team: Pcp, No as PCP - General [name], MD as PCP - Cardiology (Cardiology) [name], MD as PCP - Advanced Heart Failure (Cardiology) [name], MD as PCP - Electrophysiology (Cardiology) [name], LCSW as Social Worker (Licensed Clinical Social Worker) [name], MD as Consulting Physician (Gastroenterology)  Patient Active Problem List   Diagnosis Date Noted   Melena    Chronic diastolic CHF (congestive heart failure) ([place])    GI bleed 06/29/2021   Severe anemia 06/29/2021   Adenomatous polyp of ascending colon    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   Acute blood loss anemia    Angiodysplasia of stomach    Chronic anticoagulation    CHF exacerbation ([place]) 06/15/2020   AKI (acute kidney injury) ([place]) 06/15/2020   CKD (chronic kidney disease), stage III ([place]) 06/15/2020   Anemia due to chronic blood loss    Gastric AVM    Angiodysplasia of duodenum with hemorrhage    Acute on chronic systolic (congestive) heart failure ([place]) 04/05/2020   Anemia 04/05/2020   PAF (paroxysmal atrial fibrillation) ([place]) 04/05/2020   OSA on CPAP 04/05/2020   Syncope and collapse 04/05/2020   Type 2 diabetes mellitus with hyperglycemia, with long-term current use of insulin ([place]) 12/03/2019   Diabetic polyneuropathy associated with type 2 diabetes mellitus ([place]) 12/03/2019   Erectile dysfunction 12/03/2019   CHF (congestive heart failure) ([place]) 10/11/2019   Paranoid schizophrenia, chronic condition ([place]) 01/26/2015   Severe recurrent major depressive disorder with psychotic features ([place]) 01/26/2015   GAD (generalized anxiety disorder) 01/26/2015   OCD (obsessive compulsive disorder) 01/26/2015   Panic disorder without agoraphobia 01/26/2015   Insomnia 01/26/2015    Current Outpatient Medications:     acetaminophen (TYLENOL) 500 MG tablet, Take 2 tablets (1,000 mg total) by mouth 2 (two) times daily as needed., Disp: 180 tablet, Rfl: 3   albuterol (VENTOLIN HFA) 108 (90 Base) MCG/ACT inhaler, INHALE 2 PUFFS INTO THE LUNGS EVERY 6 (SIX) HOURS AS NEEDED FOR WHEEZING OR SHORTNESS OF BREATH., Disp: 18 g, Rfl: 3   apixaban (ELIQUIS) 5 MG TABS tablet, Take 1 tablet (5 mg total) by mouth 2 (two) times daily., Disp: 180 tablet, Rfl: 3   atorvastatin (LIPITOR) 40 MG tablet, Take 1 tablet (40 mg total) by mouth daily at 6 PM., Disp: 90 tablet, Rfl: 3   blood glucose meter kit and supplies KIT, Dispense based on patient and insurance preference. Use up to four times daily as directed. (FOR ICD-9 250.00, 250.01)., Disp: 1 each, Rfl: 0   carvedilol (COREG) 6.25 MG tablet, Take 1 tablet (6.25 mg total) by mouth 2 (two) times daily with a meal., Disp: 60 tablet, Rfl: 3   chlorhexidine (PERIDEX) 0.12 % solution, GENTLY RINSE WITH 15 ML BY MOUTH FOR 2 MINUTES TWICE DAILY, EXPECTORATE AFTER USE, Disp: 473 mL, Rfl: 0   Continuous Blood Gluc Receiver (FREESTYLE LIBRE 14 DAY READER) DEVI, 1 each by Does not apply route as needed., Disp: 1 each, Rfl: 11   cyclobenzaprine (FLEXERIL) 10 MG tablet, Take 10 mg by mouth 3 (three) times daily as needed for muscle spasms., Disp: , Rfl:    dapagliflozin propanediol (FARXIGA) 10 MG TABS tablet, Take 1 tablet (10 mg total) by mouth daily before breakfast., Disp: 30 tablet, Rfl: 11   ferrous sulfate 325 (65  FE) MG tablet, Take 1 tablet (325 mg total) by mouth every other day., Disp: 30 tablet, Rfl: 0   furosemide (LASIX) 40 MG tablet, Take 1.5 tablets (60 mg total) by mouth 2 (two) times daily., Disp: 90 tablet, Rfl: 3   gabapentin (NEURONTIN) 300 MG capsule, TAKE 2 CAPSULES (TOTAL = 600 MG), BY MOUTH, 3 TIMES A DAY., Disp: 180 capsule, Rfl: 11   glipiZIDE (GLUCOTROL) 10 MG tablet, TAKE 1 TABLET (10 MG TOTAL) BY MOUTH 2 (TWO) TIMES DAILY BEFORE A MEAL., Disp: 60 tablet, Rfl: 6    insulin glargine (LANTUS) 100 UNIT/ML injection, INJECT 0.2 MLS (20 UNITS TOTAL) INTO THE SKIN DAILY., Disp: 30 mL, Rfl: 3   Insulin Lispro Prot & Lispro (HUMALOG MIX 75/25 KWIKPEN) (75-25) 100 UNIT/ML Kwikpen, Inject 30 Units into the skin 2 (two) times daily., Disp: 15 mL, Rfl: 11   Insulin Pen Needle 31G X 6 MM MISC, USE AS DIRECTED, Disp: 100 each, Rfl: 0   Insulin Syringe-Needle U-100 31G X 5/16" 0.3 ML MISC, USE 3 TIMES DAILY TO INJECT INSULIN, Disp: 300 each, Rfl: 3   metFORMIN (GLUCOPHAGE) 500 MG tablet, TAKE 1 TABLET (500 MG TOTAL) BY MOUTH 2 (TWO) TIMES DAILY WITH A MEAL., Disp: 60 tablet, Rfl: 6   Multiple Vitamins-Minerals (CENTRUM SILVER 50+MEN) TABS, Take 1 tablet by mouth daily., Disp: , Rfl:    pantoprazole (PROTONIX) 40 MG tablet, Take 1 tablet (40 mg total) by mouth daily., Disp: 30 tablet, Rfl: 0   pantoprazole (PROTONIX) 40 MG tablet, Take 1 tablet (40 mg total) by mouth daily., Disp: 30 tablet, Rfl: 3   potassium chloride SA (KLOR-CON) 20 MEQ tablet, Take 2 tablets (40 mEq total) by mouth daily., Disp: 60 tablet, Rfl: 3   potassium chloride SA (KLOR-CON) 20 MEQ tablet, Take 2 tablets (40 mEq total) by mouth daily., Disp: 60 tablet, Rfl: 3   QUEtiapine (SEROQUEL) 300 MG tablet, Take 1.5 tablets by mouth daily., Disp: 45 tablet, Rfl: 0   sacubitril-valsartan (ENTRESTO) 24-26 MG, Take 1 tablet by mouth 2 (two) times daily., Disp: 180 tablet, Rfl: 3   spironolactone (ALDACTONE) 25 MG tablet, Take 0.5 tablets (12.5 mg total) by mouth at bedtime., Disp: 45 tablet, Rfl: 3   tetrahydrozoline 0.05 % ophthalmic solution, Place 1 drop into both eyes daily., Disp: , Rfl:    traMADol (ULTRAM) 50 MG tablet, take 1 tab BY MOUTH EVERY 4-6 hOURS AS NEEDED FOR pain., Disp: 12 tablet, Rfl: 0   TRUEPLUS PEN NEEDLES 31G X 6 MM MISC, USE AS DIRECTED, Disp: 100 each, Rfl: 0 No Known Allergies   Social History   Socioeconomic History   Marital status: Legally Separated    Spouse name: Not on file    Number of children: Not on file   Years of education: Not on file   Highest education level: Not on file  Occupational History   Not on file  Tobacco Use   Smoking status: Former   Smokeless tobacco: Never  Vaping Use   Vaping Use: Never used  Substance and Sexual Activity   Alcohol use: Not Currently    Alcohol/week: 2.0 standard drinks    Types: 2 Cans of beer per week   Drug use: No   Sexual activity: Yes  Other Topics Concern   Not on file  Social History Narrative   Not on file   Social Determinants of Health   Financial Resource Strain: Not on file  Food Insecurity: No Food Insecurity  Worried About Charity fundraiser in the Last Year: Never true   [employer] of Food in the Last Year: Never true  Transportation Needs: Unmet Transportation Needs   Lack of Transportation (Medical): Yes   Lack of Transportation (Non-Medical): Yes  Physical Activity: Not on file  Stress: Not on file  Social Connections: Not on file  Intimate Partner Violence: Not on file    Physical Exam Vitals reviewed.  Constitutional:      Appearance: Normal appearance. He is obese.  HENT:     Head: Normocephalic.     Nose: Nose normal.     Mouth/Throat:     Mouth: Mucous membranes are moist.     Pharynx: Oropharynx is clear.  Eyes:     Conjunctiva/sclera: Conjunctivae normal.     Pupils: Pupils are equal, round, and reactive to light.  Cardiovascular:     Rate and Rhythm: Normal rate and regular rhythm.     Pulses: Normal pulses.     Heart sounds: Normal heart sounds.  Pulmonary:     Effort: Pulmonary effort is normal.     Breath sounds: Normal breath sounds.  Abdominal:     Palpations: Abdomen is soft.  Musculoskeletal:        General: No swelling. Normal range of motion.     Cervical back: Normal range of motion.     Right lower leg: No edema.     Left lower leg: No edema.  Skin:    General: Skin is warm and dry.     Capillary Refill: Capillary refill takes less than 2  seconds.  Neurological:     General: No focal deficit present.     Mental Status: He is alert. Mental status is at baseline.  Psychiatric:        Mood and Affect: Mood normal.    Arrived for home visit for [facility] who was alert and oriented reporting to be feeling good. [name] was compliant with medications except one morning dose, two evening, and three bedtime doses. I explained to [name] the importance of taking all medications at correct times. He agreed with plan and verbalized understanding.   I obtained vitals and assessment. I reviewed medications and filled one pill box for week. (MISSING PANTOPRAZOLE)  CBG- 220 (after a meal and with morning insulin taken earlier today)  No BLE noted, lung sounds clear.  PCP tomorrow with Dr. Redmond Pulling, [facility] transport pick up time at 1:17.  Dentist tomorrow at 1600.   Refills: -Potassium -Glipizide -Lasix -Eliquis (needs OOP EXPENSE LIST FROM [facility] FOR BMS APPLICATION) -Farxiga  -Pantoprazole        Future Appointments  Date Time Provider [place]  08/03/2021  1:50 PM Dorna Mai, MD [facility] None  09/02/2021  7:40 AM [facility] DEVICE REMOTES [facility]  09/09/2021  8:45 AM [name], MD [facility]  09/22/2021 11:00 AM [name], MD [facility] None  09/30/2021  1:20 PM [name], NP [place] None  12/02/2021  7:40 AM [facility] DEVICE REMOTES [facility]  03/03/2022  7:40 AM [facility] DEVICE REMOTES [facility]     ACTION: Home visit completed

## 2021-08-03 ENCOUNTER — Encounter: Payer: Self-pay | Admitting: Family Medicine

## 2021-08-03 ENCOUNTER — Other Ambulatory Visit: Payer: Self-pay

## 2021-08-03 ENCOUNTER — Ambulatory Visit (INDEPENDENT_AMBULATORY_CARE_PROVIDER_SITE_OTHER): Payer: Medicare (Managed Care) | Admitting: Family Medicine

## 2021-08-03 VITALS — BP 118/81 | HR 87 | Temp 97.2°F | Resp 16 | Ht 74.0 in | Wt 312.4 lb

## 2021-08-03 DIAGNOSIS — Z7689 Persons encountering health services in other specified circumstances: Secondary | ICD-10-CM

## 2021-08-03 DIAGNOSIS — Z23 Encounter for immunization: Secondary | ICD-10-CM | POA: Diagnosis not present

## 2021-08-03 DIAGNOSIS — Z6841 Body Mass Index (BMI) 40.0 and over, adult: Secondary | ICD-10-CM

## 2021-08-03 DIAGNOSIS — M545 Low back pain, unspecified: Secondary | ICD-10-CM | POA: Diagnosis not present

## 2021-08-03 NOTE — Progress Notes (Signed)
New Patient Office Visit  Subjective:  Patient ID: [name], male    DOB: 1961-01-07  Age: 60 y.o. MRN: [id number]  CC:  Chief Complaint  Patient presents with   Back Pain   Establish Care    HPI [name] presents for to establish care. Patient reports that he has intermittent chronic back pain. Denies known trauma or injury.    Past Medical History:  Diagnosis Date   Anxiety    CAD (coronary artery disease)    CHF (congestive heart failure) ([place]) 09/2019   Depression    Diabetes mellitus    Erectile dysfunction 11/2019   H/O right heart catheterization 09/2019   Hypertension    Schizophrenia ([place])    Sleep apnea    uses cpap    Past Surgical History:  Procedure Laterality Date   BIOPSY  04/19/2021   Procedure: BIOPSY;  Surgeon: Doran Stabler, MD;  Location: [place];  Service: Gastroenterology;;   COLONOSCOPY WITH PROPOFOL N/A 04/16/2021   Procedure: COLONOSCOPY WITH PROPOFOL;  Surgeon: [name], MD;  Location: [place];  Service: Gastroenterology;  Laterality: N/A;   CORONARY STENT PLACEMENT     ENTEROSCOPY N/A 04/08/2020   Procedure: ENTEROSCOPY;  Surgeon: Doran Stabler, MD;  Location: [place];  Service: Gastroenterology;  Laterality: N/A;   ENTEROSCOPY N/A 06/17/2020   Procedure: ENTEROSCOPY;  Surgeon: Irene Shipper, MD;  Location: [facility] ENDOSCOPY;  Service: Endoscopy;  Laterality: N/A;   ENTEROSCOPY N/A 04/15/2021   Procedure: ENTEROSCOPY;  Surgeon: [name], MD;  Location: [place];  Service: Gastroenterology;  Laterality: N/A;   ENTEROSCOPY N/A 04/19/2021   Procedure: ENTEROSCOPY;  Surgeon: Doran Stabler, MD;  Location: [place];  Service: Gastroenterology;  Laterality: N/A;   ENTEROSCOPY N/A 07/01/2021   Procedure: ENTEROSCOPY;  Surgeon: Jerene Bears, MD;  Location: [facility] ENDOSCOPY;  Service: Gastroenterology;  Laterality: N/A;   GIVENS CAPSULE STUDY  04/16/2021   Procedure: GIVENS CAPSULE STUDY;   Surgeon: [name], MD;  Location: [place];  Service: Gastroenterology;;   HEMOSTASIS CLIP PLACEMENT  04/08/2020   Procedure: HEMOSTASIS CLIP PLACEMENT;  Surgeon: Doran Stabler, MD;  Location: [place];  Service: Gastroenterology;;   HEMOSTASIS CLIP PLACEMENT  07/01/2021   Procedure: HEMOSTASIS CLIP PLACEMENT;  Surgeon: Jerene Bears, MD;  Location: [place];  Service: Gastroenterology;;   HEMOSTASIS CONTROL  04/08/2020   Procedure: HEMOSTASIS CONTROL;  Surgeon: Doran Stabler, MD;  Location: [facility] ENDOSCOPY;  Service: Gastroenterology;;   HEMOSTASIS CONTROL  06/17/2020   Procedure: HEMOSTASIS CONTROL;  Surgeon: Irene Shipper, MD;  Location: [place];  Service: Endoscopy;;   HERNIA REPAIR     HOT HEMOSTASIS N/A 04/15/2021   Procedure: HOT HEMOSTASIS (ARGON PLASMA COAGULATION/BICAP);  Surgeon: [name], MD;  Location: [place];  Service: Gastroenterology;  Laterality: N/A;   HOT HEMOSTASIS N/A 07/01/2021   Procedure: HOT HEMOSTASIS (ARGON PLASMA COAGULATION/BICAP);  Surgeon: Jerene Bears, MD;  Location: [facility] ENDOSCOPY;  Service: Gastroenterology;  Laterality: N/A;   ICD IMPLANT N/A 09/03/2020   Procedure: ICD IMPLANT;  Surgeon: [name], MD;  Location: [place] CV LAB;  Service: Cardiovascular;  Laterality: N/A;   POLYPECTOMY  04/16/2021   Procedure: POLYPECTOMY;  Surgeon: [name], MD;  Location: [facility] ENDOSCOPY;  Service: Gastroenterology;;   RIGHT/LEFT HEART CATH AND CORONARY ANGIOGRAPHY N/A 10/14/2019   Procedure: RIGHT/LEFT HEART CATH AND CORONARY ANGIOGRAPHY;  Surgeon: Belva Crome, MD;  Location: [facility]  INVASIVE CV LAB;  Service: Cardiovascular;  Laterality: N/A;   SUBMUCOSAL TATTOO INJECTION  04/19/2021   Procedure: SUBMUCOSAL TATTOO INJECTION;  Surgeon: Doran Stabler, MD;  Location: [facility] ENDOSCOPY;  Service: Gastroenterology;;    Family History  Problem Relation Age of Onset   Heart failure Mother    Mental illness Sister     Mental illness Sister     Social History   Socioeconomic History   Marital status: Legally Separated    Spouse name: Not on file   Number of children: Not on file   Years of education: Not on file   Highest education level: Not on file  Occupational History   Not on file  Tobacco Use   Smoking status: Former   Smokeless tobacco: Never  Vaping Use   Vaping Use: Never used  Substance and Sexual Activity   Alcohol use: Not Currently    Alcohol/week: 2.0 standard drinks    Types: 2 Cans of beer per week   Drug use: No   Sexual activity: Yes  Other Topics Concern   Not on file  Social History Narrative   Not on file   Social Determinants of Health   Financial Resource Strain: Not on file  Food Insecurity: No Food Insecurity   Worried About Charity fundraiser in the Last Year: Never true   Ran Out of Food in the Last Year: Never true  Transportation Needs: Unmet Transportation Needs   Lack of Transportation (Medical): Yes   Lack of Transportation (Non-Medical): Yes  Physical Activity: Not on file  Stress: Not on file  Social Connections: Not on file  Intimate Partner Violence: Not on file    ROS Review of Systems  Objective:   Today's Vitals: BP 118/81 (BP Location: Right Arm, Patient Position: Sitting, Cuff Size: Large)   Pulse 87   Temp (!) 97.2 F (36.2 C) (Temporal)   Resp 16   Ht _0  (1.88 m)   Wt (!) 312 lb 6.4 oz (141.7 kg)   SpO2 94%   BMI 40.11 kg/m   Physical Exam Vitals and nursing note reviewed.  Constitutional:      General: He is not in acute distress.    Appearance: He is obese.  Cardiovascular:     Rate and Rhythm: Normal rate and regular rhythm.  Pulmonary:     Effort: Pulmonary effort is normal.     Breath sounds: Normal breath sounds.  Abdominal:     Palpations: Abdomen is soft.     Tenderness: There is no abdominal tenderness.  Musculoskeletal:     Thoracic back: Tenderness present. No deformity or signs of trauma. Normal  range of motion.  Neurological:     General: No focal deficit present.     Mental Status: He is alert and oriented to person, place, and time.    Assessment & Plan:   1. Bilateral low back pain without sciatica, unspecified chronicity Exercises given. Weight loss encouraged. Tylenol/nsaids/topical preps prn.  2. Class 3 severe obesity due to excess calories without serious comorbidity with body mass index (BMI) of 40.0 to 44.9 in adult [facility]) Discussed dietary and activity options with goal of 3-5lbs  per month of weight loss.   3. Need for influenza vaccination  - Flu Vaccine QUAD 89moIM (Fluarix, Fluzone & Alfiuria Quad PF)  4. Need for shingles vaccine  - Zoster, Recombinant (Shingrix)  5. Encounter to establish care     Outpatient Encounter Medications as of 08/03/2021  Medication Sig   acetaminophen (TYLENOL) 500 MG tablet Take 2 tablets (1,000 mg total) by mouth 2 (two) times daily as needed.   albuterol (VENTOLIN HFA) 108 (90 Base) MCG/ACT inhaler INHALE 2 PUFFS INTO THE LUNGS EVERY 6 (SIX) HOURS AS NEEDED FOR WHEEZING OR SHORTNESS OF BREATH.   apixaban (ELIQUIS) 5 MG TABS tablet Take 1 tablet (5 mg total) by mouth 2 (two) times daily.   atorvastatin (LIPITOR) 40 MG tablet Take 1 tablet (40 mg total) by mouth daily at 6 PM.   blood glucose meter kit and supplies KIT Dispense based on patient and insurance preference. Use up to four times daily as directed. (FOR ICD-9 250.00, 250.01).   carvedilol (COREG) 6.25 MG tablet Take 1 tablet (6.25 mg total) by mouth 2 (two) times daily with a meal.   chlorhexidine (PERIDEX) 0.12 % solution GENTLY RINSE WITH 15 ML BY MOUTH FOR 2 MINUTES TWICE DAILY, EXPECTORATE AFTER USE   Continuous Blood Gluc Receiver (FREESTYLE LIBRE 14 DAY READER) DEVI 1 each by Does not apply route as needed.   cyclobenzaprine (FLEXERIL) 10 MG tablet Take 10 mg by mouth 3 (three) times daily as needed for muscle spasms.   dapagliflozin propanediol (FARXIGA) 10 MG  TABS tablet Take 1 tablet (10 mg total) by mouth daily before breakfast.   ferrous sulfate 325 (65 FE) MG tablet Take 1 tablet (325 mg total) by mouth every other day.   furosemide (LASIX) 40 MG tablet Take 1.5 tablets (60 mg total) by mouth 2 (two) times daily.   gabapentin (NEURONTIN) 300 MG capsule TAKE 2 CAPSULES (TOTAL = 600 MG), BY MOUTH, 3 TIMES A DAY.   glipiZIDE (GLUCOTROL) 10 MG tablet TAKE 1 TABLET (10 MG TOTAL) BY MOUTH 2 (TWO) TIMES DAILY BEFORE A MEAL.   insulin glargine (LANTUS) 100 UNIT/ML injection INJECT 0.2 MLS (20 UNITS TOTAL) INTO THE SKIN DAILY.   Insulin Lispro Prot & Lispro (HUMALOG MIX 75/25 KWIKPEN) (75-25) 100 UNIT/ML Kwikpen Inject 30 Units into the skin 2 (two) times daily.   Insulin Pen Needle 31G X 6 MM MISC USE AS DIRECTED   Insulin Syringe-Needle U-100 31G X 5/16" 0.3 ML MISC USE 3 TIMES DAILY TO INJECT INSULIN   metFORMIN (GLUCOPHAGE) 500 MG tablet TAKE 1 TABLET (500 MG TOTAL) BY MOUTH 2 (TWO) TIMES DAILY WITH A MEAL.   Multiple Vitamins-Minerals (CENTRUM SILVER 50+MEN) TABS Take 1 tablet by mouth daily.   pantoprazole (PROTONIX) 40 MG tablet Take 1 tablet (40 mg total) by mouth daily.   pantoprazole (PROTONIX) 40 MG tablet Take 1 tablet (40 mg total) by mouth daily.   potassium chloride SA (KLOR-CON) 20 MEQ tablet Take 2 tablets (40 mEq total) by mouth daily.   potassium chloride SA (KLOR-CON) 20 MEQ tablet Take 2 tablets (40 mEq total) by mouth daily.   QUEtiapine (SEROQUEL) 300 MG tablet Take 1.5 tablets by mouth daily.   sacubitril-valsartan (ENTRESTO) 24-26 MG Take 1 tablet by mouth 2 (two) times daily.   spironolactone (ALDACTONE) 25 MG tablet Take 0.5 tablets (12.5 mg total) by mouth at bedtime.   tetrahydrozoline 0.05 % ophthalmic solution Place 1 drop into both eyes daily.   traMADol (ULTRAM) 50 MG tablet take 1 tab BY MOUTH EVERY 4-6 hOURS AS NEEDED FOR pain.   TRUEPLUS PEN NEEDLES 31G X 6 MM MISC USE AS DIRECTED   No facility-administered encounter  medications on file as of 08/03/2021.    Follow-up: Return for physical.   Becky Sax, MD

## 2021-08-08 ENCOUNTER — Telehealth (HOSPITAL_COMMUNITY): Payer: Self-pay

## 2021-08-08 ENCOUNTER — Other Ambulatory Visit: Payer: Self-pay

## 2021-08-08 NOTE — Telephone Encounter (Signed)
Called in refills to [facility] and requested OOP list.   -Glipizide -Lasix -Eliquis -Pantoprazole   _farxiga to [facility]  Will be ready today, I will call and remind [name] to pick up before our visit tomorrow.

## 2021-08-09 ENCOUNTER — Other Ambulatory Visit (HOSPITAL_COMMUNITY): Payer: Self-pay

## 2021-08-09 NOTE — Progress Notes (Signed)
Paramedicine Encounter    Patient ID: [name], male    DOB: 1961/09/23, 60 y.o.   MRN: [id number]   Patient Care Team: Pcp, No as PCP - General [name], MD as PCP - Cardiology (Cardiology) [name], MD as PCP - Advanced Heart Failure (Cardiology) [name], MD as PCP - Electrophysiology (Cardiology) [name], LCSW as Social Worker (Licensed Clinical Social Worker) [name], MD as Consulting Physician (Gastroenterology)  Patient Active Problem List   Diagnosis Date Noted   Melena    Chronic diastolic CHF (congestive heart failure) ([place])    GI bleed 06/29/2021   Severe anemia 06/29/2021   Adenomatous polyp of ascending colon    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   Acute blood loss anemia    Angiodysplasia of stomach    Chronic anticoagulation    CHF exacerbation ([place]) 06/15/2020   AKI (acute kidney injury) ([place]) 06/15/2020   CKD (chronic kidney disease), stage III ([place]) 06/15/2020   Anemia due to chronic blood loss    Gastric AVM    Angiodysplasia of duodenum with hemorrhage    Acute on chronic systolic (congestive) heart failure ([place]) 04/05/2020   Anemia 04/05/2020   PAF (paroxysmal atrial fibrillation) ([place]) 04/05/2020   OSA on CPAP 04/05/2020   Syncope and collapse 04/05/2020   Type 2 diabetes mellitus with hyperglycemia, with long-term current use of insulin ([place]) 12/03/2019   Diabetic polyneuropathy associated with type 2 diabetes mellitus ([place]) 12/03/2019   Erectile dysfunction 12/03/2019   CHF (congestive heart failure) ([place]) 10/11/2019   Paranoid schizophrenia, chronic condition ([place]) 01/26/2015   Severe recurrent major depressive disorder with psychotic features ([place]) 01/26/2015   GAD (generalized anxiety disorder) 01/26/2015   OCD (obsessive compulsive disorder) 01/26/2015   Panic disorder without agoraphobia 01/26/2015   Insomnia 01/26/2015    Current Outpatient Medications:     acetaminophen (TYLENOL) 500 MG tablet, Take 2 tablets (1,000 mg total) by mouth 2 (two) times daily as needed., Disp: 180 tablet, Rfl: 3   albuterol (VENTOLIN HFA) 108 (90 Base) MCG/ACT inhaler, INHALE 2 PUFFS INTO THE LUNGS EVERY 6 (SIX) HOURS AS NEEDED FOR WHEEZING OR SHORTNESS OF BREATH., Disp: 18 g, Rfl: 3   apixaban (ELIQUIS) 5 MG TABS tablet, Take 1 tablet (5 mg total) by mouth 2 (two) times daily., Disp: 180 tablet, Rfl: 3   atorvastatin (LIPITOR) 40 MG tablet, Take 1 tablet (40 mg total) by mouth daily at 6 PM., Disp: 90 tablet, Rfl: 3   blood glucose meter kit and supplies KIT, Dispense based on patient and insurance preference. Use up to four times daily as directed. (FOR ICD-9 250.00, 250.01)., Disp: 1 each, Rfl: 0   carvedilol (COREG) 6.25 MG tablet, Take 1 tablet (6.25 mg total) by mouth 2 (two) times daily with a meal., Disp: 60 tablet, Rfl: 3   chlorhexidine (PERIDEX) 0.12 % solution, GENTLY RINSE WITH 15 ML BY MOUTH FOR 2 MINUTES TWICE DAILY, EXPECTORATE AFTER USE, Disp: 473 mL, Rfl: 0   Continuous Blood Gluc Receiver (FREESTYLE LIBRE 14 DAY READER) DEVI, 1 each by Does not apply route as needed., Disp: 1 each, Rfl: 11   cyclobenzaprine (FLEXERIL) 10 MG tablet, Take 10 mg by mouth 3 (three) times daily as needed for muscle spasms., Disp: , Rfl:    dapagliflozin propanediol (FARXIGA) 10 MG TABS tablet, Take 1 tablet (10 mg total) by mouth daily before breakfast., Disp: 30 tablet, Rfl: 11   ferrous sulfate 325 (65  FE) MG tablet, Take 1 tablet (325 mg total) by mouth every other day., Disp: 30 tablet, Rfl: 0   furosemide (LASIX) 40 MG tablet, Take 1.5 tablets (60 mg total) by mouth 2 (two) times daily., Disp: 90 tablet, Rfl: 3   gabapentin (NEURONTIN) 300 MG capsule, TAKE 2 CAPSULES (TOTAL = 600 MG), BY MOUTH, 3 TIMES A DAY., Disp: 180 capsule, Rfl: 11   glipiZIDE (GLUCOTROL) 10 MG tablet, TAKE 1 TABLET (10 MG TOTAL) BY MOUTH 2 (TWO) TIMES DAILY BEFORE A MEAL., Disp: 60 tablet, Rfl: 6    insulin glargine (LANTUS) 100 UNIT/ML injection, INJECT 0.2 MLS (20 UNITS TOTAL) INTO THE SKIN DAILY., Disp: 30 mL, Rfl: 3   Insulin Lispro Prot & Lispro (HUMALOG MIX 75/25 KWIKPEN) (75-25) 100 UNIT/ML Kwikpen, Inject 30 Units into the skin 2 (two) times daily., Disp: 15 mL, Rfl: 11   Insulin Pen Needle 31G X 6 MM MISC, USE AS DIRECTED, Disp: 100 each, Rfl: 0   Insulin Syringe-Needle U-100 31G X 5/16" 0.3 ML MISC, USE 3 TIMES DAILY TO INJECT INSULIN, Disp: 300 each, Rfl: 3   metFORMIN (GLUCOPHAGE) 500 MG tablet, TAKE 1 TABLET (500 MG TOTAL) BY MOUTH 2 (TWO) TIMES DAILY WITH A MEAL., Disp: 60 tablet, Rfl: 6   Multiple Vitamins-Minerals (CENTRUM SILVER 50+MEN) TABS, Take 1 tablet by mouth daily., Disp: , Rfl:    pantoprazole (PROTONIX) 40 MG tablet, Take 1 tablet (40 mg total) by mouth daily., Disp: 30 tablet, Rfl: 0   pantoprazole (PROTONIX) 40 MG tablet, Take 1 tablet (40 mg total) by mouth daily., Disp: 30 tablet, Rfl: 3   potassium chloride SA (KLOR-CON) 20 MEQ tablet, Take 2 tablets (40 mEq total) by mouth daily., Disp: 60 tablet, Rfl: 3   potassium chloride SA (KLOR-CON) 20 MEQ tablet, Take 2 tablets (40 mEq total) by mouth daily., Disp: 60 tablet, Rfl: 3   QUEtiapine (SEROQUEL) 300 MG tablet, Take 1.5 tablets by mouth daily., Disp: 45 tablet, Rfl: 0   sacubitril-valsartan (ENTRESTO) 24-26 MG, Take 1 tablet by mouth 2 (two) times daily., Disp: 180 tablet, Rfl: 3   spironolactone (ALDACTONE) 25 MG tablet, Take 0.5 tablets (12.5 mg total) by mouth at bedtime., Disp: 45 tablet, Rfl: 3   tetrahydrozoline 0.05 % ophthalmic solution, Place 1 drop into both eyes daily., Disp: , Rfl:    traMADol (ULTRAM) 50 MG tablet, take 1 tab BY MOUTH EVERY 4-6 hOURS AS NEEDED FOR pain., Disp: 12 tablet, Rfl: 0   TRUEPLUS PEN NEEDLES 31G X 6 MM MISC, USE AS DIRECTED, Disp: 100 each, Rfl: 0 No Known Allergies   Social History   Socioeconomic History   Marital status: Legally Separated    Spouse name: Not on file    Number of children: Not on file   Years of education: Not on file   Highest education level: Not on file  Occupational History   Not on file  Tobacco Use   Smoking status: Former   Smokeless tobacco: Never  Vaping Use   Vaping Use: Never used  Substance and Sexual Activity   Alcohol use: Not Currently    Alcohol/week: 2.0 standard drinks    Types: 2 Cans of beer per week   Drug use: No   Sexual activity: Yes  Other Topics Concern   Not on file  Social History Narrative   Not on file   Social Determinants of Health   Financial Resource Strain: Not on file  Food Insecurity: No Food Insecurity  Worried About Charity fundraiser in the Last Year: Never true   [employer] of Food in the Last Year: Never true  Transportation Needs: Unmet Transportation Needs   Lack of Transportation (Medical): Yes   Lack of Transportation (Non-Medical): Yes  Physical Activity: Not on file  Stress: Not on file  Social Connections: Not on file  Intimate Partner Violence: Not on file    Physical Exam Vitals reviewed.  Constitutional:      Appearance: Normal appearance. He is normal weight.  HENT:     Head: Normocephalic.     Nose: Nose normal.     Mouth/Throat:     Mouth: Mucous membranes are moist.     Pharynx: Oropharynx is clear.  Eyes:     Conjunctiva/sclera: Conjunctivae normal.     Pupils: Pupils are equal, round, and reactive to light.  Cardiovascular:     Rate and Rhythm: Normal rate and regular rhythm.     Pulses: Normal pulses.     Heart sounds: Normal heart sounds.  Pulmonary:     Effort: Pulmonary effort is normal.     Breath sounds: Normal breath sounds.  Abdominal:     General: Abdomen is flat.     Palpations: Abdomen is soft.  Musculoskeletal:        General: No swelling. Normal range of motion.     Cervical back: Normal range of motion.     Right lower leg: No edema.     Left lower leg: No edema.  Skin:    General: Skin is warm and dry.     Capillary Refill:  Capillary refill takes less than 2 seconds.  Neurological:     General: No focal deficit present.     Mental Status: He is alert. Mental status is at baseline.  Psychiatric:        Mood and Affect: Mood normal.    Arrived for home visit for [facility] who reports doing good and having no complaints. He denied shortness of breath, dizziness or chest pain. He has been med compliant. Vitals and assessment as noted.   No edema, lung sounds clear.   Meds reviewed, 2 pill boxes filled accordingly.   CBG- 284 (he will pick up freestyle sensors from [employer] this week)  [name] reports having some food insecurity and needing help with same. I will get a Blessed Table Referral for him and bring out next week. I will also check in with LCSW in clinic for medicaid/disability assistance.   Home visit complete.   Refills: Metformin Potassium Entresto Gabapentin Insulin Pen Needles Farxiga    Future Appointments  Date Time Provider [place]  09/02/2021  7:40 AM [facility]  09/09/2021  8:45 AM [name], MD [facility]  09/22/2021 11:00 AM [name], MD [facility] None  09/30/2021  1:20 PM [name], NP [place] None  12/02/2021  7:40 AM [facility]ICE REMOTES [facility]  03/03/2022  7:40 AM [facility] DEVICE REMOTES [facility]     ACTION: Home visit completed

## 2021-08-11 ENCOUNTER — Encounter: Payer: Medicare (Managed Care) | Admitting: Cardiology

## 2021-08-16 ENCOUNTER — Other Ambulatory Visit: Payer: Self-pay

## 2021-08-16 ENCOUNTER — Other Ambulatory Visit: Payer: Self-pay | Admitting: Nurse Practitioner

## 2021-08-16 ENCOUNTER — Other Ambulatory Visit (HOSPITAL_COMMUNITY): Payer: Self-pay

## 2021-08-16 MED ORDER — QUETIAPINE FUMARATE 300 MG PO TABS
ORAL_TABLET | ORAL | 0 refills | Status: DC
  Filled 2021-08-16: qty 45, 30d supply, fill #0

## 2021-08-16 MED ORDER — TRUEPLUS 5-BEVEL PEN NEEDLES 31G X 6 MM MISC
0 refills | Status: DC
  Filled 2021-08-16: qty 100, 90d supply, fill #0
  Filled 2021-10-31: qty 100, 30d supply, fill #0
  Filled 2021-11-14: qty 100, 25d supply, fill #0

## 2021-08-16 MED FILL — Gabapentin Cap 300 MG: ORAL | 30 days supply | Qty: 180 | Fill #5 | Status: AC

## 2021-08-16 NOTE — Progress Notes (Signed)
Paramedicine Encounter    Patient ID: [name], male    DOB: Apr 01, 1961, 60 y.o.   MRN: [id number]   Patient Care Team: Pcp, No as PCP - General [name], MD as PCP - Cardiology (Cardiology) [name], MD as PCP - Advanced Heart Failure (Cardiology) [name], MD as PCP - Electrophysiology (Cardiology) [name], LCSW as Social Worker (Licensed Clinical Social Worker) [name], MD as Consulting Physician (Gastroenterology)  Patient Active Problem List   Diagnosis Date Noted   Melena    Chronic diastolic CHF (congestive heart failure) ([place])    GI bleed 06/29/2021   Severe anemia 06/29/2021   Adenomatous polyp of ascending colon    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   Acute blood loss anemia    Angiodysplasia of stomach    Chronic anticoagulation    CHF exacerbation ([place]) 06/15/2020   AKI (acute kidney injury) ([place]) 06/15/2020   CKD (chronic kidney disease), stage III ([place]) 06/15/2020   Anemia due to chronic blood loss    Gastric AVM    Angiodysplasia of duodenum with hemorrhage    Acute on chronic systolic (congestive) heart failure ([place]) 04/05/2020   Anemia 04/05/2020   PAF (paroxysmal atrial fibrillation) ([place]) 04/05/2020   OSA on CPAP 04/05/2020   Syncope and collapse 04/05/2020   Type 2 diabetes mellitus with hyperglycemia, with long-term current use of insulin ([place]) 12/03/2019   Diabetic polyneuropathy associated with type 2 diabetes mellitus ([place]) 12/03/2019   Erectile dysfunction 12/03/2019   CHF (congestive heart failure) ([place]) 10/11/2019   Paranoid schizophrenia, chronic condition ([place]) 01/26/2015   Severe recurrent major depressive disorder with psychotic features ([place]) 01/26/2015   GAD (generalized anxiety disorder) 01/26/2015   OCD (obsessive compulsive disorder) 01/26/2015   Panic disorder without agoraphobia 01/26/2015   Insomnia 01/26/2015    Current Outpatient Medications:     acetaminophen (TYLENOL) 500 MG tablet, Take 2 tablets (1,000 mg total) by mouth 2 (two) times daily as needed., Disp: 180 tablet, Rfl: 3   albuterol (VENTOLIN HFA) 108 (90 Base) MCG/ACT inhaler, INHALE 2 PUFFS INTO THE LUNGS EVERY 6 (SIX) HOURS AS NEEDED FOR WHEEZING OR SHORTNESS OF BREATH., Disp: 18 g, Rfl: 3   apixaban (ELIQUIS) 5 MG TABS tablet, Take 1 tablet (5 mg total) by mouth 2 (two) times daily., Disp: 180 tablet, Rfl: 3   atorvastatin (LIPITOR) 40 MG tablet, Take 1 tablet (40 mg total) by mouth daily at 6 PM., Disp: 90 tablet, Rfl: 3   blood glucose meter kit and supplies KIT, Dispense based on patient and insurance preference. Use up to four times daily as directed. (FOR ICD-9 250.00, 250.01)., Disp: 1 each, Rfl: 0   carvedilol (COREG) 6.25 MG tablet, Take 1 tablet (6.25 mg total) by mouth 2 (two) times daily with a meal., Disp: 60 tablet, Rfl: 3   chlorhexidine (PERIDEX) 0.12 % solution, GENTLY RINSE WITH 15 ML BY MOUTH FOR 2 MINUTES TWICE DAILY, EXPECTORATE AFTER USE, Disp: 473 mL, Rfl: 0   Continuous Blood Gluc Receiver (FREESTYLE LIBRE 14 DAY READER) DEVI, 1 each by Does not apply route as needed., Disp: 1 each, Rfl: 11   cyclobenzaprine (FLEXERIL) 10 MG tablet, Take 10 mg by mouth 3 (three) times daily as needed for muscle spasms., Disp: , Rfl:    dapagliflozin propanediol (FARXIGA) 10 MG TABS tablet, Take 1 tablet (10 mg total) by mouth daily before breakfast., Disp: 30 tablet, Rfl: 11   ferrous sulfate 325 (65  FE) MG tablet, Take 1 tablet (325 mg total) by mouth every other day., Disp: 30 tablet, Rfl: 0   furosemide (LASIX) 40 MG tablet, Take 1.5 tablets (60 mg total) by mouth 2 (two) times daily., Disp: 90 tablet, Rfl: 3   gabapentin (NEURONTIN) 300 MG capsule, TAKE 2 CAPSULES (TOTAL = 600 MG), BY MOUTH, 3 TIMES A DAY., Disp: 180 capsule, Rfl: 11   glipiZIDE (GLUCOTROL) 10 MG tablet, TAKE 1 TABLET (10 MG TOTAL) BY MOUTH 2 (TWO) TIMES DAILY BEFORE A MEAL., Disp: 60 tablet, Rfl: 6    insulin glargine (LANTUS) 100 UNIT/ML injection, INJECT 0.2 MLS (20 UNITS TOTAL) INTO THE SKIN DAILY., Disp: 30 mL, Rfl: 3   Insulin Lispro Prot & Lispro (HUMALOG MIX 75/25 KWIKPEN) (75-25) 100 UNIT/ML Kwikpen, Inject 30 Units into the skin 2 (two) times daily., Disp: 15 mL, Rfl: 11   Insulin Pen Needle (TRUEPLUS PEN NEEDLES) 31G X 6 MM MISC, USE AS DIRECTED, Disp: 100 each, Rfl: 0   Insulin Syringe-Needle U-100 31G X 5/16" 0.3 ML MISC, USE 3 TIMES DAILY TO INJECT INSULIN, Disp: 300 each, Rfl: 3   metFORMIN (GLUCOPHAGE) 500 MG tablet, TAKE 1 TABLET (500 MG TOTAL) BY MOUTH 2 (TWO) TIMES DAILY WITH A MEAL., Disp: 60 tablet, Rfl: 6   Multiple Vitamins-Minerals (CENTRUM SILVER 50+MEN) TABS, Take 1 tablet by mouth daily., Disp: , Rfl:    pantoprazole (PROTONIX) 40 MG tablet, Take 1 tablet (40 mg total) by mouth daily., Disp: 30 tablet, Rfl: 0   pantoprazole (PROTONIX) 40 MG tablet, Take 1 tablet (40 mg total) by mouth daily., Disp: 30 tablet, Rfl: 3   potassium chloride SA (KLOR-CON) 20 MEQ tablet, Take 2 tablets (40 mEq total) by mouth daily., Disp: 60 tablet, Rfl: 3   potassium chloride SA (KLOR-CON) 20 MEQ tablet, Take 2 tablets (40 mEq total) by mouth daily., Disp: 60 tablet, Rfl: 3   QUEtiapine (SEROQUEL) 300 MG tablet, Take 1 & 1/2 tablet by mouth daily, Disp: 45 tablet, Rfl: 0   sacubitril-valsartan (ENTRESTO) 24-26 MG, Take 1 tablet by mouth 2 (two) times daily., Disp: 180 tablet, Rfl: 3   spironolactone (ALDACTONE) 25 MG tablet, Take 0.5 tablets (12.5 mg total) by mouth at bedtime., Disp: 45 tablet, Rfl: 3   tetrahydrozoline 0.05 % ophthalmic solution, Place 1 drop into both eyes daily., Disp: , Rfl:    traMADol (ULTRAM) 50 MG tablet, take 1 tab BY MOUTH EVERY 4-6 hOURS AS NEEDED FOR pain., Disp: 12 tablet, Rfl: 0   TRUEPLUS PEN NEEDLES 31G X 6 MM MISC, USE AS DIRECTED, Disp: 100 each, Rfl: 0 No Known Allergies   Social History   Socioeconomic History   Marital status: Legally Separated     Spouse name: Not on file   Number of children: Not on file   Years of education: Not on file   Highest education level: Not on file  Occupational History   Not on file  Tobacco Use   Smoking status: Former   Smokeless tobacco: Never  Vaping Use   Vaping Use: Never used  Substance and Sexual Activity   Alcohol use: Not Currently    Alcohol/week: 2.0 standard drinks    Types: 2 Cans of beer per week   Drug use: No   Sexual activity: Yes  Other Topics Concern   Not on file  Social History Narrative   Not on file   Social Determinants of Health   Financial Resource Strain: Not on file  Food  Insecurity: No Food Insecurity   Worried About Charity fundraiser in the Last Year: Never true   Ran Out of Food in the Last Year: Never true  Transportation Needs: Unmet Transportation Needs   Lack of Transportation (Medical): Yes   Lack of Transportation (Non-Medical): Yes  Physical Activity: Not on file  Stress: Not on file  Social Connections: Not on file  Intimate Partner Violence: Not on file    Physical Exam Vitals reviewed.  Constitutional:      Appearance: Normal appearance. He is obese.  HENT:     Head: Normocephalic.     Nose: Nose normal.     Mouth/Throat:     Mouth: Mucous membranes are moist.     Pharynx: Oropharynx is clear.  Eyes:     Conjunctiva/sclera: Conjunctivae normal.     Pupils: Pupils are equal, round, and reactive to light.  Cardiovascular:     Rate and Rhythm: Normal rate and regular rhythm.     Pulses: Normal pulses.     Heart sounds: Normal heart sounds.  Pulmonary:     Effort: Pulmonary effort is normal.     Breath sounds: Normal breath sounds.  Abdominal:     General: Abdomen is flat.     Palpations: Abdomen is soft.  Musculoskeletal:        General: No swelling. Normal range of motion.     Cervical back: Normal range of motion.     Right lower leg: No edema.     Left lower leg: No edema.  Skin:    General: Skin is warm and dry.      Capillary Refill: Capillary refill takes less than 2 seconds.  Neurological:     General: No focal deficit present.     Mental Status: He is alert. Mental status is at baseline.  Psychiatric:        Mood and Affect: Mood normal.    Arrived for home visit for [facility] who reports feeling good with no complaints of shortness of breath, dizziness or trouble breathing or taking his medications. [name] has a 6lb weight gain this week but no swelling, edema, or JVD noted. His lungs clear on assessment. [name] reports he has been eating out a lot including fast food. I provided him with a Blessed Table referral today and encouraged him to eat a heart healthy diet the best he could. He agreed with plan. [name] also was reminded to be sure to weigh daily and keep a log of it.   Vitals as noted in report. CBG0 167 Freestyle placed on right bicep today.   Medications were reviewed and confirmed, two pill boxes made for [facility] and plans to see him in two weeks.   Appointments reviewed and confirmed. Transportation set up for this weeks appointment with [place], pick up time at 10:00.   Home visit complete. I will see [name] in two weeks.   Refills: NONE       Future Appointments  Date Time Provider [place]  08/18/2021 10:45 AM [name], MD [facility]  09/02/2021  7:40 AM [facility] DEVICE REMOTES [facility]  09/22/2021 11:00 AM [name], MD [facility] None  09/30/2021  1:20 PM [name], NP [place] None  12/02/2021  7:40 AM [facility] DEVICE REMOTES [facility]  03/03/2022  7:40 AM [facility] DEVICE REMOTES [facility]     ACTION: Home visit completed

## 2021-08-18 ENCOUNTER — Encounter: Payer: Medicare (Managed Care) | Admitting: Cardiology

## 2021-08-24 ENCOUNTER — Telehealth (HOSPITAL_COMMUNITY): Payer: Self-pay | Admitting: Pharmacy Technician

## 2021-08-24 NOTE — Telephone Encounter (Addendum)
Advanced Heart Failure Patient Advocate Encounter  Sent in BMS application via fax with updated OOP.  Will follow up.

## 2021-08-25 ENCOUNTER — Telehealth (HOSPITAL_COMMUNITY): Payer: Self-pay | Admitting: *Deleted

## 2021-08-25 NOTE — Telephone Encounter (Signed)
[name], community paramedic request Entresto samples for pt, samples provided  Medication Samples have been provided to the patient.  Drug name: Delene Loll       Strength: 24/26mg         Qty: 2  LOT: [id number]  Exp.Date: 10/24  Dosing instructions: Take 1 tab Twice daily   The patient has been instructed regarding the correct time, dose, and frequency of taking this medication, including desired effects and most common side effects.   [name] 11:29 AM 08/25/2021

## 2021-08-29 ENCOUNTER — Telehealth (HOSPITAL_COMMUNITY): Payer: Self-pay | Admitting: Licensed Clinical Social Worker

## 2021-08-29 NOTE — Telephone Encounter (Signed)
[employer] application for [employer] assistance faxed in for review- fax confirmation received- awaiting determination  [name], [facility] Desk#: [phone] Cell#: [phone]

## 2021-08-30 ENCOUNTER — Other Ambulatory Visit (HOSPITAL_COMMUNITY): Payer: Self-pay

## 2021-08-30 NOTE — Progress Notes (Signed)
Paramedicine Encounter    Patient ID: [name], male    DOB: Apr 01, 1961, 60 y.o.   MRN: [id number]   Patient Care Team: Pcp, No as PCP - General [name], MD as PCP - Cardiology (Cardiology) [name], MD as PCP - Advanced Heart Failure (Cardiology) [name], MD as PCP - Electrophysiology (Cardiology) [name], LCSW as Social Worker (Licensed Clinical Social Worker) [name], MD as Consulting Physician (Gastroenterology)  Patient Active Problem List   Diagnosis Date Noted   Melena    Chronic diastolic CHF (congestive heart failure) ([place])    GI bleed 06/29/2021   Severe anemia 06/29/2021   Adenomatous polyp of ascending colon    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   Acute blood loss anemia    Angiodysplasia of stomach    Chronic anticoagulation    CHF exacerbation ([place]) 06/15/2020   AKI (acute kidney injury) ([place]) 06/15/2020   CKD (chronic kidney disease), stage III ([place]) 06/15/2020   Anemia due to chronic blood loss    Gastric AVM    Angiodysplasia of duodenum with hemorrhage    Acute on chronic systolic (congestive) heart failure ([place]) 04/05/2020   Anemia 04/05/2020   PAF (paroxysmal atrial fibrillation) ([place]) 04/05/2020   OSA on CPAP 04/05/2020   Syncope and collapse 04/05/2020   Type 2 diabetes mellitus with hyperglycemia, with long-term current use of insulin ([place]) 12/03/2019   Diabetic polyneuropathy associated with type 2 diabetes mellitus ([place]) 12/03/2019   Erectile dysfunction 12/03/2019   CHF (congestive heart failure) ([place]) 10/11/2019   Paranoid schizophrenia, chronic condition ([place]) 01/26/2015   Severe recurrent major depressive disorder with psychotic features ([place]) 01/26/2015   GAD (generalized anxiety disorder) 01/26/2015   OCD (obsessive compulsive disorder) 01/26/2015   Panic disorder without agoraphobia 01/26/2015   Insomnia 01/26/2015    Current Outpatient Medications:     acetaminophen (TYLENOL) 500 MG tablet, Take 2 tablets (1,000 mg total) by mouth 2 (two) times daily as needed., Disp: 180 tablet, Rfl: 3   albuterol (VENTOLIN HFA) 108 (90 Base) MCG/ACT inhaler, INHALE 2 PUFFS INTO THE LUNGS EVERY 6 (SIX) HOURS AS NEEDED FOR WHEEZING OR SHORTNESS OF BREATH., Disp: 18 g, Rfl: 3   apixaban (ELIQUIS) 5 MG TABS tablet, Take 1 tablet (5 mg total) by mouth 2 (two) times daily., Disp: 180 tablet, Rfl: 3   atorvastatin (LIPITOR) 40 MG tablet, Take 1 tablet (40 mg total) by mouth daily at 6 PM., Disp: 90 tablet, Rfl: 3   blood glucose meter kit and supplies KIT, Dispense based on patient and insurance preference. Use up to four times daily as directed. (FOR ICD-9 250.00, 250.01)., Disp: 1 each, Rfl: 0   carvedilol (COREG) 6.25 MG tablet, Take 1 tablet (6.25 mg total) by mouth 2 (two) times daily with a meal., Disp: 60 tablet, Rfl: 3   chlorhexidine (PERIDEX) 0.12 % solution, GENTLY RINSE WITH 15 ML BY MOUTH FOR 2 MINUTES TWICE DAILY, EXPECTORATE AFTER USE, Disp: 473 mL, Rfl: 0   Continuous Blood Gluc Receiver (FREESTYLE LIBRE 14 DAY READER) DEVI, 1 each by Does not apply route as needed., Disp: 1 each, Rfl: 11   cyclobenzaprine (FLEXERIL) 10 MG tablet, Take 10 mg by mouth 3 (three) times daily as needed for muscle spasms., Disp: , Rfl:    dapagliflozin propanediol (FARXIGA) 10 MG TABS tablet, Take 1 tablet (10 mg total) by mouth daily before breakfast., Disp: 30 tablet, Rfl: 11   ferrous sulfate 325 (65  FE) MG tablet, Take 1 tablet (325 mg total) by mouth every other day., Disp: 30 tablet, Rfl: 0   furosemide (LASIX) 40 MG tablet, Take 1.5 tablets (60 mg total) by mouth 2 (two) times daily., Disp: 90 tablet, Rfl: 3   gabapentin (NEURONTIN) 300 MG capsule, TAKE 2 CAPSULES (TOTAL = 600 MG), BY MOUTH, 3 TIMES A DAY., Disp: 180 capsule, Rfl: 11   glipiZIDE (GLUCOTROL) 10 MG tablet, TAKE 1 TABLET (10 MG TOTAL) BY MOUTH 2 (TWO) TIMES DAILY BEFORE A MEAL., Disp: 60 tablet, Rfl: 6    insulin glargine (LANTUS) 100 UNIT/ML injection, INJECT 0.2 MLS (20 UNITS TOTAL) INTO THE SKIN DAILY., Disp: 30 mL, Rfl: 3   Insulin Lispro Prot & Lispro (HUMALOG MIX 75/25 KWIKPEN) (75-25) 100 UNIT/ML Kwikpen, Inject 30 Units into the skin 2 (two) times daily., Disp: 15 mL, Rfl: 11   Insulin Pen Needle (TRUEPLUS PEN NEEDLES) 31G X 6 MM MISC, USE AS DIRECTED, Disp: 100 each, Rfl: 0   Insulin Syringe-Needle U-100 31G X 5/16" 0.3 ML MISC, USE 3 TIMES DAILY TO INJECT INSULIN, Disp: 300 each, Rfl: 3   metFORMIN (GLUCOPHAGE) 500 MG tablet, TAKE 1 TABLET (500 MG TOTAL) BY MOUTH 2 (TWO) TIMES DAILY WITH A MEAL., Disp: 60 tablet, Rfl: 6   Multiple Vitamins-Minerals (CENTRUM SILVER 50+MEN) TABS, Take 1 tablet by mouth daily., Disp: , Rfl:    pantoprazole (PROTONIX) 40 MG tablet, Take 1 tablet (40 mg total) by mouth daily., Disp: 30 tablet, Rfl: 0   pantoprazole (PROTONIX) 40 MG tablet, Take 1 tablet (40 mg total) by mouth daily., Disp: 30 tablet, Rfl: 3   potassium chloride SA (KLOR-CON) 20 MEQ tablet, Take 2 tablets (40 mEq total) by mouth daily., Disp: 60 tablet, Rfl: 3   potassium chloride SA (KLOR-CON) 20 MEQ tablet, Take 2 tablets (40 mEq total) by mouth daily., Disp: 60 tablet, Rfl: 3   QUEtiapine (SEROQUEL) 300 MG tablet, Take 1 & 1/2 tablet by mouth daily, Disp: 45 tablet, Rfl: 0   sacubitril-valsartan (ENTRESTO) 24-26 MG, Take 1 tablet by mouth 2 (two) times daily., Disp: 180 tablet, Rfl: 3   spironolactone (ALDACTONE) 25 MG tablet, Take 0.5 tablets (12.5 mg total) by mouth at bedtime., Disp: 45 tablet, Rfl: 3   tetrahydrozoline 0.05 % ophthalmic solution, Place 1 drop into both eyes daily., Disp: , Rfl:    traMADol (ULTRAM) 50 MG tablet, take 1 tab BY MOUTH EVERY 4-6 hOURS AS NEEDED FOR pain., Disp: 12 tablet, Rfl: 0   TRUEPLUS PEN NEEDLES 31G X 6 MM MISC, USE AS DIRECTED, Disp: 100 each, Rfl: 0 No Known Allergies   Social History   Socioeconomic History   Marital status: Legally Separated     Spouse name: Not on file   Number of children: Not on file   Years of education: Not on file   Highest education level: Not on file  Occupational History   Not on file  Tobacco Use   Smoking status: Former   Smokeless tobacco: Never  Vaping Use   Vaping Use: Never used  Substance and Sexual Activity   Alcohol use: Not Currently    Alcohol/week: 2.0 standard drinks    Types: 2 Cans of beer per week   Drug use: No   Sexual activity: Yes  Other Topics Concern   Not on file  Social History Narrative   Not on file   Social Determinants of Health   Financial Resource Strain: Not on file  Food  Insecurity: No Food Insecurity   Worried About Charity fundraiser in the Last Year: Never true   Ran Out of Food in the Last Year: Never true  Transportation Needs: Unmet Transportation Needs   Lack of Transportation (Medical): Yes   Lack of Transportation (Non-Medical): Yes  Physical Activity: Not on file  Stress: Not on file  Social Connections: Not on file  Intimate Partner Violence: Not on file    Physical Exam Vitals reviewed.  Constitutional:      Appearance: Normal appearance. He is normal weight.  HENT:     Head: Normocephalic.     Nose: Nose normal.     Mouth/Throat:     Mouth: Mucous membranes are moist.     Pharynx: Oropharynx is clear.  Eyes:     Conjunctiva/sclera: Conjunctivae normal.     Pupils: Pupils are equal, round, and reactive to light.  Cardiovascular:     Rate and Rhythm: Normal rate and regular rhythm.     Pulses: Normal pulses.     Heart sounds: Normal heart sounds.  Pulmonary:     Effort: Pulmonary effort is normal. No respiratory distress.     Breath sounds: Normal breath sounds. No wheezing or rales.  Abdominal:     General: Abdomen is flat.     Palpations: Abdomen is soft.  Musculoskeletal:        General: No swelling. Normal range of motion.     Cervical back: Normal range of motion.     Right lower leg: No edema.     Left lower leg: No  edema.  Skin:    General: Skin is warm and dry.     Capillary Refill: Capillary refill takes less than 2 seconds.  Neurological:     General: No focal deficit present.     Mental Status: He is alert. Mental status is at baseline.  Psychiatric:        Mood and Affect: Mood normal.   Arrived for home visit for [name] who is alert and oriented reporting to be feeling okay with no complaints. He states he has been feeling good with no chest pain, dizziness or shortness of breath. [name] has been taking his medications but missed 3 daily doses over the last week. Vitals and assessment as noted.   Meds reviewed and confirmed. 2 pill boxes filled accordingly.   [name] reminded of fluid and salt restrictions and he verbalized understanding.   No social needs at this time. Home visit complete. I will see [name] in two weeks.   CBG- 283 (after eating)  Freestyle placed at 1605.    REFILLS: Carvedilol Pantoprazole Spironolactone     Future Appointments  Date Time Provider [place]  09/02/2021  7:40 AM [facility] DEVICE REMOTES [facility]  09/21/2021  2:30 PM [name], MD [facility]  09/22/2021 11:00 AM [name], MD [facility] None  09/30/2021  1:20 PM [name], NP [place] None  12/02/2021  7:40 AM [facility] DEVICE REMOTES [facility]  03/03/2022  7:40 AM [facility] DEVICE REMOTES [facility]     ACTION: Home visit completed

## 2021-08-31 LAB — CUP PACEART REMOTE DEVICE CHECK
Battery Voltage: 3.06 V
Brady Statistic RV Percent Paced: 0 %
Date Time Interrogation Session: 20221005092449
HighPow Impedance: 86 Ohm
Implantable Lead Implant Date: 20211008
Implantable Lead Location: 753860
Implantable Lead Model: 436910
Implantable Lead Serial Number: 81404997
Implantable Pulse Generator Implant Date: 20211008
Lead Channel Impedance Value: 501 Ohm
Lead Channel Pacing Threshold Amplitude: 0.4 V
Lead Channel Pacing Threshold Pulse Width: 0.4 ms
Lead Channel Sensing Intrinsic Amplitude: 19.8 mV
Lead Channel Sensing Intrinsic Amplitude: 3.1 mV
Lead Channel Setting Pacing Amplitude: 2 V
Lead Channel Setting Pacing Pulse Width: 0.4 ms
Lead Channel Setting Sensing Sensitivity: 0.8 mV
Pulse Gen Model: 429525
Pulse Gen Serial Number: 84810752

## 2021-09-02 ENCOUNTER — Ambulatory Visit (INDEPENDENT_AMBULATORY_CARE_PROVIDER_SITE_OTHER): Payer: Medicare (Managed Care)

## 2021-09-02 DIAGNOSIS — I480 Paroxysmal atrial fibrillation: Secondary | ICD-10-CM | POA: Diagnosis not present

## 2021-09-05 ENCOUNTER — Other Ambulatory Visit: Payer: Self-pay

## 2021-09-05 ENCOUNTER — Telehealth: Payer: Self-pay | Admitting: Pharmacist

## 2021-09-05 ENCOUNTER — Telehealth (HOSPITAL_COMMUNITY): Payer: Self-pay

## 2021-09-05 NOTE — Telephone Encounter (Signed)
Advanced Heart Failure Patient Advocate Encounter   Patient was approved to receive Eliquis from [employer]  Patient ID: [id number] Effective dates: 08/26/21 through 11/26/21  Sent approval information to [place], (EMT).  Charlann Boxer, CPhT

## 2021-09-05 NOTE — Telephone Encounter (Signed)
Contacted pharmacy to call in refills of the following:  -pantoprazole -carvedilol -sprionolactone   I called to let him know as well to get 3 rx's ASAP but there was no answer and the mailbox was full so unable to LVM. I did send him a text as well.   Marylouise Stacks, EMT-Paramedic  09/05/21

## 2021-09-05 NOTE — Progress Notes (Signed)
[facility])  [place] Team    09/05/2021  MARWIN PRIMMER 1961-06-21 [id number]  Reason for referral:  2023 Re-enrollment for Patient Assistance  Referral source:  Re-enrollment  Current insurance: [employer]  Outreach:  Successful telephone call with Mr. Proudfoot.  HIPAA identifiers verified.   No Known Allergies   Medication Assistance Findings:  Medication assistance needs identified: Humalog 75/25   Extra Help:  Not eligible for Extra Help Low Income Subsidy based on reported income and assets  Patient Assistance Programs: Novolog 75/25 made by [employer] requirement met: Unknown Out-of-pocket prescription expenditure met:   Not Applicable Reviewed program requirements with patient.     Plan: Mr. Panas is interested in receiving support for medication assistance through [place] for the 2023 re-enrollment period.  Mr. Weideman will get a copy of his household income for the application process.  Mr. Orsino would like a call back after his 09/30/2021 appointment to allow time for him to determine who will be prescribing his insulin for the 2023 calendar year.  Will follow up with patient after 09/30/2021 appointment.

## 2021-09-09 ENCOUNTER — Ambulatory Visit: Payer: Medicare (Managed Care) | Admitting: Cardiology

## 2021-09-12 ENCOUNTER — Other Ambulatory Visit: Payer: Self-pay

## 2021-09-13 ENCOUNTER — Other Ambulatory Visit: Payer: Self-pay

## 2021-09-13 ENCOUNTER — Other Ambulatory Visit (HOSPITAL_COMMUNITY): Payer: Self-pay

## 2021-09-13 NOTE — Progress Notes (Signed)
Paramedicine Encounter    Patient ID: [name], male    DOB: 1961/04/11, 60 y.o.   MRN: [id number]   Patient Care Team: Pcp, No as PCP - General [name], MD as PCP - Cardiology (Cardiology) [name], MD as PCP - Advanced Heart Failure (Cardiology) [name], MD as PCP - Electrophysiology (Cardiology) [name], LCSW as Social Worker (Licensed Clinical Social Worker) [name], MD as Consulting Physician (Gastroenterology)  Patient Active Problem List   Diagnosis Date Noted   Melena    Chronic diastolic CHF (congestive heart failure) ([place])    GI bleed 06/29/2021   Severe anemia 06/29/2021   Adenomatous polyp of ascending colon    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   Acute blood loss anemia    Angiodysplasia of stomach    Chronic anticoagulation    CHF exacerbation ([place]) 06/15/2020   AKI (acute kidney injury) ([place]) 06/15/2020   CKD (chronic kidney disease), stage III ([place]) 06/15/2020   Anemia due to chronic blood loss    Gastric AVM    Angiodysplasia of duodenum with hemorrhage    Acute on chronic systolic (congestive) heart failure ([place]) 04/05/2020   Anemia 04/05/2020   PAF (paroxysmal atrial fibrillation) ([place]) 04/05/2020   OSA on CPAP 04/05/2020   Syncope and collapse 04/05/2020   Type 2 diabetes mellitus with hyperglycemia, with long-term current use of insulin ([place]) 12/03/2019   Diabetic polyneuropathy associated with type 2 diabetes mellitus ([place]) 12/03/2019   Erectile dysfunction 12/03/2019   CHF (congestive heart failure) ([place]) 10/11/2019   Paranoid schizophrenia, chronic condition ([place]) 01/26/2015   Severe recurrent major depressive disorder with psychotic features ([place]) 01/26/2015   GAD (generalized anxiety disorder) 01/26/2015   OCD (obsessive compulsive disorder) 01/26/2015   Panic disorder without agoraphobia 01/26/2015   Insomnia 01/26/2015    Current Outpatient Medications:     acetaminophen (TYLENOL) 500 MG tablet, Take 2 tablets (1,000 mg total) by mouth 2 (two) times daily as needed., Disp: 180 tablet, Rfl: 3   albuterol (VENTOLIN HFA) 108 (90 Base) MCG/ACT inhaler, INHALE 2 PUFFS INTO THE LUNGS EVERY 6 (SIX) HOURS AS NEEDED FOR WHEEZING OR SHORTNESS OF BREATH., Disp: 18 g, Rfl: 3   apixaban (ELIQUIS) 5 MG TABS tablet, Take 1 tablet (5 mg total) by mouth 2 (two) times daily., Disp: 180 tablet, Rfl: 3   atorvastatin (LIPITOR) 40 MG tablet, Take 1 tablet (40 mg total) by mouth daily at 6 PM., Disp: 90 tablet, Rfl: 3   blood glucose meter kit and supplies KIT, Dispense based on patient and insurance preference. Use up to four times daily as directed. (FOR ICD-9 250.00, 250.01)., Disp: 1 each, Rfl: 0   carvedilol (COREG) 6.25 MG tablet, Take 1 tablet (6.25 mg total) by mouth 2 (two) times daily with a meal., Disp: 60 tablet, Rfl: 3   chlorhexidine (PERIDEX) 0.12 % solution, GENTLY RINSE WITH 15 ML BY MOUTH FOR 2 MINUTES TWICE DAILY, EXPECTORATE AFTER USE (Patient not taking: Reported on 08/30/2021), Disp: 473 mL, Rfl: 0   Continuous Blood Gluc Receiver (FREESTYLE LIBRE 14 DAY READER) DEVI, 1 each by Does not apply route as needed., Disp: 1 each, Rfl: 11   cyclobenzaprine (FLEXERIL) 10 MG tablet, Take 10 mg by mouth 3 (three) times daily as needed for muscle spasms., Disp: , Rfl:    dapagliflozin propanediol (FARXIGA) 10 MG TABS tablet, Take 1 tablet (10 mg total) by mouth daily before breakfast., Disp: 30 tablet, Rfl: 11  ferrous sulfate 325 (65 FE) MG tablet, Take 1 tablet (325 mg total) by mouth every other day., Disp: 30 tablet, Rfl: 0   furosemide (LASIX) 40 MG tablet, Take 1.5 tablets (60 mg total) by mouth 2 (two) times daily., Disp: 90 tablet, Rfl: 3   gabapentin (NEURONTIN) 300 MG capsule, TAKE 2 CAPSULES (TOTAL = 600 MG), BY MOUTH, 3 TIMES A DAY., Disp: 180 capsule, Rfl: 11   glipiZIDE (GLUCOTROL) 10 MG tablet, TAKE 1 TABLET (10 MG TOTAL) BY MOUTH 2 (TWO) TIMES DAILY  BEFORE A MEAL., Disp: 60 tablet, Rfl: 6   insulin glargine (LANTUS) 100 UNIT/ML injection, INJECT 0.2 MLS (20 UNITS TOTAL) INTO THE SKIN DAILY., Disp: 30 mL, Rfl: 3   Insulin Lispro Prot & Lispro (HUMALOG MIX 75/25 KWIKPEN) (75-25) 100 UNIT/ML Kwikpen, Inject 30 Units into the skin 2 (two) times daily., Disp: 15 mL, Rfl: 11   Insulin Pen Needle (TRUEPLUS PEN NEEDLES) 31G X 6 MM MISC, USE AS DIRECTED, Disp: 100 each, Rfl: 0   Insulin Syringe-Needle U-100 31G X 5/16" 0.3 ML MISC, USE 3 TIMES DAILY TO INJECT INSULIN, Disp: 300 each, Rfl: 3   metFORMIN (GLUCOPHAGE) 500 MG tablet, TAKE 1 TABLET (500 MG TOTAL) BY MOUTH 2 (TWO) TIMES DAILY WITH A MEAL., Disp: 60 tablet, Rfl: 6   Multiple Vitamins-Minerals (CENTRUM SILVER 50+MEN) TABS, Take 1 tablet by mouth daily., Disp: , Rfl:    pantoprazole (PROTONIX) 40 MG tablet, Take 1 tablet (40 mg total) by mouth daily., Disp: 30 tablet, Rfl: 0   pantoprazole (PROTONIX) 40 MG tablet, Take 1 tablet (40 mg total) by mouth daily., Disp: 30 tablet, Rfl: 3   potassium chloride SA (KLOR-CON) 20 MEQ tablet, Take 2 tablets (40 mEq total) by mouth daily., Disp: 60 tablet, Rfl: 3   potassium chloride SA (KLOR-CON) 20 MEQ tablet, Take 2 tablets (40 mEq total) by mouth daily., Disp: 60 tablet, Rfl: 3   QUEtiapine (SEROQUEL) 300 MG tablet, Take 1 & 1/2 tablet by mouth daily, Disp: 45 tablet, Rfl: 0   sacubitril-valsartan (ENTRESTO) 24-26 MG, Take 1 tablet by mouth 2 (two) times daily., Disp: 180 tablet, Rfl: 3   spironolactone (ALDACTONE) 25 MG tablet, Take 0.5 tablets (12.5 mg total) by mouth at bedtime., Disp: 45 tablet, Rfl: 3   tetrahydrozoline 0.05 % ophthalmic solution, Place 1 drop into both eyes daily., Disp: , Rfl:    traMADol (ULTRAM) 50 MG tablet, take 1 tab BY MOUTH EVERY 4-6 hOURS AS NEEDED FOR pain., Disp: 12 tablet, Rfl: 0   TRUEPLUS PEN NEEDLES 31G X 6 MM MISC, USE AS DIRECTED, Disp: 100 each, Rfl: 0 No Known Allergies   Social History   Socioeconomic  History   Marital status: Legally Separated    Spouse name: Not on file   Number of children: Not on file   Years of education: Not on file   Highest education level: Not on file  Occupational History   Not on file  Tobacco Use   Smoking status: Former   Smokeless tobacco: Never  Vaping Use   Vaping Use: Never used  Substance and Sexual Activity   Alcohol use: Not Currently    Alcohol/week: 2.0 standard drinks    Types: 2 Cans of beer per week   Drug use: No   Sexual activity: Yes  Other Topics Concern   Not on file  Social History Narrative   Not on file   Social Determinants of Health   Financial Resource Strain: Not  on file  Food Insecurity: No Food Insecurity   Worried About Charity fundraiser in the Last Year: Never true   Ran Out of Food in the Last Year: Never true  Transportation Needs: Unmet Transportation Needs   Lack of Transportation (Medical): Yes   Lack of Transportation (Non-Medical): Yes  Physical Activity: Not on file  Stress: Not on file  Social Connections: Not on file  Intimate Partner Violence: Not on file    Physical Exam Vitals reviewed.  Constitutional:      Appearance: Normal appearance. He is normal weight.  HENT:     Head: Normocephalic.     Nose: Congestion present.     Mouth/Throat:     Mouth: Mucous membranes are moist.     Pharynx: Oropharynx is clear.  Eyes:     Conjunctiva/sclera: Conjunctivae normal.     Pupils: Pupils are equal, round, and reactive to light.  Cardiovascular:     Rate and Rhythm: Normal rate and regular rhythm.     Pulses: Normal pulses.     Heart sounds: Normal heart sounds.  Pulmonary:     Effort: Pulmonary effort is normal. No respiratory distress.     Breath sounds: Normal breath sounds. No wheezing, rhonchi or rales.  Abdominal:     General: Abdomen is flat.     Palpations: Abdomen is soft.  Musculoskeletal:        General: No swelling. Normal range of motion.     Cervical back: Normal range of  motion.     Right lower leg: No edema.     Left lower leg: No edema.  Skin:    General: Skin is warm and dry.     Capillary Refill: Capillary refill takes less than 2 seconds.  Neurological:     General: No focal deficit present.     Mental Status: He is alert. Mental status is at baseline.  Psychiatric:        Mood and Affect: Mood normal.    Arrived for home visit for [name] who reports feeling sick over the last week with congestion, body aches, chills and fever. He reports he is feeling better today but states he has been taking Theraflu and Tylenol. He has a productive cough but lung sounds were clear. Vitals were obtained. No fever at present. [name] missed 4 doses of evening medications over the last week. I encouraged him to remember to take his medications as prescribed. I filled one week of medications in pill box accordingly. [name] was reminded of upcoming appointments he agreed to attend same. Eliquis still has not been delivered, we called [employer] and got shipment enrolled and will be delivered Friday.   Home visit complete I will meet [name] next week in clinic.   Refills: Seroquel Spironolactone Glipizide Potassium  Metformin Lasix Carvedilol    New freestyle sensor placed.     Future Appointments  Date Time Provider [place]  09/21/2021  2:30 PM [name], MD [facility]  09/22/2021 11:00 AM [name], MD [facility] None  09/30/2021  1:20 PM [name], NP [place] None  12/02/2021  7:40 AM [facility] DEVICE REMOTES [facility]  03/03/2022  7:40 AM [facility] DEVICE REMOTES [facility]     ACTION: Home visit completed

## 2021-09-13 NOTE — Progress Notes (Signed)
Remote ICD transmission.   

## 2021-09-14 ENCOUNTER — Other Ambulatory Visit: Payer: Self-pay

## 2021-09-14 MED ORDER — QUETIAPINE FUMARATE 300 MG PO TABS
ORAL_TABLET | ORAL | 0 refills | Status: DC
  Filled 2021-09-14 – 2021-09-21 (×2): qty 45, 30d supply, fill #0

## 2021-09-20 ENCOUNTER — Other Ambulatory Visit: Payer: Self-pay

## 2021-09-20 ENCOUNTER — Telehealth (HOSPITAL_COMMUNITY): Payer: Self-pay | Admitting: *Deleted

## 2021-09-20 NOTE — Telephone Encounter (Signed)
Med list faxed as requested.   ----- Message from Jeris Penta, EMT sent at 09/19/2021  3:08 PM EDT ----- Regarding: fax med list Can we please fax med list to [phone] ATTN- Hiram Comber for Mr. Zobrist. Thanks!

## 2021-09-20 NOTE — Telephone Encounter (Signed)
-----   Message from Jeris Penta, EMT sent at 09/19/2021  3:08 PM EDT ----- Regarding: fax med list Can we please fax med list to [phone] ATTN- Hiram Comber for Mr. Bellucci. Thanks!

## 2021-09-21 ENCOUNTER — Ambulatory Visit (INDEPENDENT_AMBULATORY_CARE_PROVIDER_SITE_OTHER): Payer: Medicare (Managed Care) | Admitting: Cardiology

## 2021-09-21 ENCOUNTER — Other Ambulatory Visit: Payer: Self-pay

## 2021-09-21 ENCOUNTER — Encounter: Payer: Self-pay | Admitting: Cardiology

## 2021-09-21 VITALS — BP 128/90 | HR 95 | Ht 74.0 in | Wt 315.0 lb

## 2021-09-21 DIAGNOSIS — Z01818 Encounter for other preprocedural examination: Secondary | ICD-10-CM

## 2021-09-21 DIAGNOSIS — Z8719 Personal history of other diseases of the digestive system: Secondary | ICD-10-CM | POA: Diagnosis not present

## 2021-09-21 DIAGNOSIS — I480 Paroxysmal atrial fibrillation: Secondary | ICD-10-CM | POA: Diagnosis not present

## 2021-09-21 DIAGNOSIS — I5023 Acute on chronic systolic (congestive) heart failure: Secondary | ICD-10-CM

## 2021-09-21 DIAGNOSIS — Z01812 Encounter for preprocedural laboratory examination: Secondary | ICD-10-CM

## 2021-09-21 DIAGNOSIS — Z95810 Presence of automatic (implantable) cardiac defibrillator: Secondary | ICD-10-CM | POA: Diagnosis not present

## 2021-09-21 DIAGNOSIS — T50901A Poisoning by unspecified drugs, medicaments and biological substances, accidental (unintentional), initial encounter: Secondary | ICD-10-CM

## 2021-09-21 MED ORDER — METOPROLOL TARTRATE 100 MG PO TABS
ORAL_TABLET | ORAL | 0 refills | Status: DC
  Filled 2021-09-21: qty 1, 1d supply, fill #0

## 2021-09-21 NOTE — Patient Instructions (Addendum)
Medication Instructions:  Your physician recommends that you continue on your current medications as directed. Please refer to the Current Medication list given to you today.  *If you need a refill on your cardiac medications before your next appointment, please call your pharmacy*   Lab Work: Today: BMET & UDS If you have labs (blood work) drawn today and your tests are completely normal, you will receive your results only by: [place] (if you have MyChart) OR A paper copy in the mail If you have any lab test that is abnormal or we need to change your treatment, we will call you to review the results.   Testing/Procedures: You are being worked up for [employer].  Your physician has requested that you have cardiac CT. Cardiac computed tomography (CT) is a painless test that uses an x-ray machine to take clear, detailed pictures of your heart.  Please follow instructions below under other instructions.    Follow-Up: At [facility], you and your health needs are our priority.  As part of our continuing mission to provide you with exceptional heart care, we have created designated Provider Care Teams.  These Care Teams include your primary Cardiologist (physician) and Advanced Practice Providers (APPs -  Physician Assistants and Nurse Practitioners) who all work together to provide you with the care you need, when you need it.  We recommend signing up for the patient portal called "MyChart".  Sign up information is provided on this After Visit Summary.  MyChart is used to connect with patients for Virtual Visits (Telemedicine).  Patients are able to view lab/test results, encounter notes, upcoming appointments, etc.  Non-urgent messages can be sent to your provider as well.   To learn more about what you can do with MyChart, go to [email or web address].    Your next appointment:   To be  determined  The format for your next appointment:   In Person  Provider:   Lars Mage, MD    Thank you for choosing [facility]!!    Other Instructions    WATCHMAN CT INSTRUCTIONS: Your WATCHMAN CT will be scheduled at [facility]: [address] [place], [place] [address] [phone]  Please arrive at the [facility] main entrance (entrance A) of [facility] 30 minutes prior to test start time. Proceed to the [facility] Radiology Department (first floor) to check-in and test prep.  Please follow these instructions carefully:  Hold all erectile dysfunction medications at least 3 days (72 hrs) prior to test.  On the Night Before the Test: Be sure to Drink plenty of water. Do not consume any caffeinated/decaffeinated beverages or chocolate 12 hours prior to your test. Do not take any antihistamines 12 hours prior to your test.  On the Day of the Test: Drink plenty of water until 1 hour prior to the test. Do not eat any food 4 hours prior to the test. You may take your regular medications prior to the test.  Take metoprolol (Lopressor) 100 mg two hours prior to test. Hold you Carvedilol the morning of this test. HOLD Furosemide/Hydrochlorothiazide morning of the test.       After the Test: Drink plenty of water. After receiving IV contrast, you may experience a mild flushed feeling. This is normal. On occasion, you may experience a mild rash up to 24 hours after the test. This is not dangerous. If this occurs, you can take Benadryl 25 mg and increase your fluid intake. If you experience trouble breathing,  this can be serious. If it is severe call 911 IMMEDIATELY. If it is mild, please call our office. If you take any of these medications: Glipizide/Metformin, Avandament, Glucavance, please do not take 48 hours after completing test unless otherwise instructed.  Once we have confirmed authorization from your insurance company, you will be called to set up a date and time for your test.   For non-scheduling related  questions/concerns about your CT scan, please contact the cardiac imaging nurses: Marchia Bond, Cardiac Imaging Nurse Navigator Gordy Clement, Cardiac Imaging Nurse [facility] Direct Office Dial: [phone]   For scheduling needs, including cancellations and rescheduling, please call [place], [phone].  Lenice Llamas, the Watchman Nurse Navigator, will call you after your CT once the [facility] Team has reviewed your imaging for an update on proceedings. [place]'s direct number is [phone] if you need assistance.
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
## 2021-09-22 ENCOUNTER — Other Ambulatory Visit: Payer: Self-pay

## 2021-09-22 ENCOUNTER — Telehealth: Payer: Self-pay | Admitting: Pharmacist

## 2021-09-22 ENCOUNTER — Other Ambulatory Visit (HOSPITAL_COMMUNITY): Payer: Self-pay

## 2021-09-22 ENCOUNTER — Telehealth (HOSPITAL_COMMUNITY): Payer: Self-pay | Admitting: Licensed Clinical Social Worker

## 2021-09-22 ENCOUNTER — Encounter (HOSPITAL_COMMUNITY): Payer: Self-pay | Admitting: Cardiology

## 2021-09-22 ENCOUNTER — Other Ambulatory Visit: Payer: Self-pay | Admitting: Family Medicine

## 2021-09-22 ENCOUNTER — Ambulatory Visit (HOSPITAL_COMMUNITY)
Admission: RE | Admit: 2021-09-22 | Discharge: 2021-09-22 | Disposition: A | Discharge status: Home / Self Care | Payer: Medicare (Managed Care) | Source: Ambulatory Visit | Attending: Cardiology | Admitting: Cardiology

## 2021-09-22 VITALS — BP 106/80 | HR 80 | Wt 313.4 lb

## 2021-09-22 DIAGNOSIS — I480 Paroxysmal atrial fibrillation: Secondary | ICD-10-CM | POA: Diagnosis not present

## 2021-09-22 DIAGNOSIS — Z87891 Personal history of nicotine dependence: Secondary | ICD-10-CM | POA: Insufficient documentation

## 2021-09-22 DIAGNOSIS — I110 Hypertensive heart disease with heart failure: Secondary | ICD-10-CM | POA: Diagnosis not present

## 2021-09-22 DIAGNOSIS — E669 Obesity, unspecified: Secondary | ICD-10-CM | POA: Insufficient documentation

## 2021-09-22 DIAGNOSIS — G4733 Obstructive sleep apnea (adult) (pediatric): Secondary | ICD-10-CM | POA: Diagnosis not present

## 2021-09-22 DIAGNOSIS — Z955 Presence of coronary angioplasty implant and graft: Secondary | ICD-10-CM | POA: Insufficient documentation

## 2021-09-22 DIAGNOSIS — I5032 Chronic diastolic (congestive) heart failure: Secondary | ICD-10-CM

## 2021-09-22 DIAGNOSIS — I2510 Atherosclerotic heart disease of native coronary artery without angina pectoris: Secondary | ICD-10-CM | POA: Insufficient documentation

## 2021-09-22 DIAGNOSIS — Z79899 Other long term (current) drug therapy: Secondary | ICD-10-CM | POA: Insufficient documentation

## 2021-09-22 DIAGNOSIS — I5022 Chronic systolic (congestive) heart failure: Secondary | ICD-10-CM | POA: Diagnosis present

## 2021-09-22 DIAGNOSIS — Z8249 Family history of ischemic heart disease and other diseases of the circulatory system: Secondary | ICD-10-CM | POA: Diagnosis not present

## 2021-09-22 DIAGNOSIS — Z794 Long term (current) use of insulin: Secondary | ICD-10-CM | POA: Diagnosis not present

## 2021-09-22 DIAGNOSIS — Z7901 Long term (current) use of anticoagulants: Secondary | ICD-10-CM | POA: Insufficient documentation

## 2021-09-22 DIAGNOSIS — E119 Type 2 diabetes mellitus without complications: Secondary | ICD-10-CM | POA: Insufficient documentation

## 2021-09-22 DIAGNOSIS — D649 Anemia, unspecified: Secondary | ICD-10-CM

## 2021-09-22 DIAGNOSIS — F209 Schizophrenia, unspecified: Secondary | ICD-10-CM | POA: Insufficient documentation

## 2021-09-22 LAB — CBC
HCT: 47.1 % (ref 39.0–52.0)
Hemoglobin: 14.1 g/dL (ref 13.0–17.0)
MCH: 24.2 pg — ABNORMAL LOW (ref 26.0–34.0)
MCHC: 29.9 g/dL — ABNORMAL LOW (ref 30.0–36.0)
MCV: 80.9 fL (ref 80.0–100.0)
Platelets: 262 10*3/uL (ref 150–400)
RBC: 5.82 MIL/uL — ABNORMAL HIGH (ref 4.22–5.81)
RDW: 16.9 % — ABNORMAL HIGH (ref 11.5–15.5)
WBC: 6.7 10*3/uL (ref 4.0–10.5)
nRBC: 0 % (ref 0.0–0.2)

## 2021-09-22 LAB — IRON AND TIBC
Iron: 67 ug/dL (ref 45–182)
Saturation Ratios: 14 % — ABNORMAL LOW (ref 17.9–39.5)
TIBC: 487 ug/dL — ABNORMAL HIGH (ref 250–450)
UIBC: 420 ug/dL

## 2021-09-22 LAB — BASIC METABOLIC PANEL
BUN/Creatinine Ratio: 14 (ref 10–24)
BUN: 18 mg/dL (ref 8–27)
CO2: 25 mmol/L (ref 20–29)
Calcium: 9.7 mg/dL (ref 8.6–10.2)
Chloride: 104 mmol/L (ref 96–106)
Creatinine, Ser: 1.25 mg/dL (ref 0.76–1.27)
Glucose: 126 mg/dL — ABNORMAL HIGH (ref 70–99)
Potassium: 4.4 mmol/L (ref 3.5–5.2)
Sodium: 145 mmol/L — ABNORMAL HIGH (ref 134–144)
eGFR: 66 mL/min/{1.73_m2} (ref >59)

## 2021-09-22 LAB — FERRITIN: Ferritin: 15 ng/mL — ABNORMAL LOW (ref 24–336)

## 2021-09-22 MED ORDER — GABAPENTIN 300 MG PO CAPS
ORAL_CAPSULE | ORAL | 11 refills | Status: DC
  Filled 2021-09-22: qty 180, 30d supply, fill #0

## 2021-09-22 NOTE — Patient Instructions (Signed)
Labs done today, we will call you for abnormal results  You have been referred to [facility] at [facility] for weight loss medication, they will call you  We will contact you regarding sleep study  Your physician recommends that you schedule a follow-up appointment in: 3 months  Do the following things EVERYDAY: Weigh yourself in the morning before breakfast. Write it down and keep it in a log. Take your medicines as prescribed Eat low salt foods--Limit salt (sodium) to 2000 mg per day.  Stay as active as you can everyday Limit all fluids for the day to less than 2 liters  If you have any questions or concerns before your next appointment please send [place] a message through [place] or call our office at [phone].    TO LEAVE A MESSAGE FOR THE NURSE SELECT OPTION 2, PLEASE LEAVE A MESSAGE INCLUDING: YOUR NAME DATE OF BIRTH CALL BACK NUMBER REASON FOR CALL**this is important as we prioritize the call backs  YOU WILL RECEIVE A CALL BACK THE SAME DAY AS LONG AS YOU CALL BEFORE 4:00 PM  At the [facility], you and your health needs are our priority. As part of our continuing mission to provide you with exceptional heart care, we have created designated Provider Care Teams. These Care Teams include your primary Cardiologist (physician) and Advanced Practice Providers (APPs- Physician Assistants and Nurse Practitioners) who all work together to provide you with the care you need, when you need it.   You may see any of the following providers on your designated Care Team at your next follow up: Dr Glori Bickers Dr Haynes Kerns, NP Lyda Jester, [place] [facility] [place], [place] [name], PharmD   Please be sure to bring in all your medications bottles to every appointment.

## 2021-09-22 NOTE — Progress Notes (Addendum)
ADVANCED HEART FAILURE CLINIC NOTE  PCP: Pcp, No Cardiology: Dr. Radford Pax HF Cardiology: Dr. Aundra Dubin  60 y.o. with history of chronic systolic CHF, CAD, type 2 diabetes, paroxysmal atrial fibrillation, and schizophrenia was referred by Dr. Radford Pax for evaluation of CHF.  Patient had OM2 PCI in 2012.  In 11/20, he was admitted with CHF. Echo showed EF 25-30% with diffuse hypokinesis.  LHC was done, showing occluded OM2 at prior stent, 90% D1 stenosis, and extensive diffuse RCA disease.  No intervention.  He was thought to be in paroxysmal atrial fibrillation during this appointment and apixaban was started.   He does not smoke, rarely drinks, and does not use drugs.  His mother had "heart problems."    He can write his name only. He is only able to read a few words. Thinks he completed the 7th grade.    Echo in 5/21 showed EF 30% with diffuse hypokinesis, mild LVH, PASP 38, mildly decreased RV systolic function, IVC dilated. Biotronik ICD placed.   He was hospitalized in 7/21 with upper GI bleeding and CHF exacerbation.  EGD showed duodenal AVMs, treated with APC. He was diuresed.   He was admitted to [facility] in 5/22 for a/c CHF. He was diuresed with IV lasix. Hospitalization complicated by GIB & AKI. Enteroscopy performed on 04/15/21 which showed 2 small angioectasias with no bleeding in the third portion of the duodenum. Colonoscopy on 04/16/21, showed multiple polyps which were biopsied. No source of bleeding found. He had pill endoscopy 5/22 which showed non-bleeding AVM. Pt had repeat enteroscopy which showed non-bleeding jejunal ulcer. Eliquis held during admission but restarted at discharge. Home hydralazine, spiro, Entresto, and Imdur stopped in setting of GIB; carvedilol dose decreased at discharge.  Admitted 7/22 for chest pain, found to have hgb of 4.7. He received 2 U PRBCs, Eliquis was held and GI & AHF were consulted for further management. Farxiga and spiro held with increase in kidney  function. Repeat echo on 06/30/21 EF 50-55%, mildly dilated LV, mild LVH, RV okay, dilated IVC with estimated RA pressure of 8. He underwent EGD on 07/01/21 showing three non-bleeding angioectasias in the duodenum. Treated with APC. GDMT therapy added back as SCr improved. Eliquis restarted and arrangement made for referral for Watchman device consideration. Day of discharge weight 324 lbs, SCr 1.29 and hgb 8.3.   Today he returns for post hospitalization HF followup. He is being followed by Paramedicine.  Weight down 8 lbs.  No BRBPR/melena.  He gets short of breath walking up stairs.  No dyspnea walking on flat ground.  No chest pain.  No lightheadedness.  No orthopnea/PND.  No smoking.  Drinks ETOH rarely.   ECG (10/22, personally reviewed): NSR with PVCs  Labs (1/21): LDL 66, HDL 42, hgb 11.3, K 4.7, creatinine 1.32 Labs (4/21): K 5, creatinine 1.37 Labs (7/21): K 4.1, creatinine 1.26, hgb 9.3 Labs (8/21): LDL 51, K 3.8, creatinine 1.3 Labs (11/21): K 4, creatinine 1.42, hgb 13.1, hgbA1c 11.7 Labs (2/22): HgbA1c 7.7 Labs (3/22): K 4.4, creatinine 1.27, pro-BNP 249. Labs (5/22): K 4.4, creatinine 1.22 Labs (8/22): K 4.1, creatinine 1.00, hgb 8.3 Labs (10/22): K 4.4, creatinine 1.25  PMH:  1. Atrial fibrillation: Paroxysmal 2. Type 2 diabetes 3. HTN 4. Hyperlipidemia 5. Schizophrenia 6. CAD: PCI OM2 in 2012.  - LHC (11/20): 90% D1 stenosis, totally occluded OM2 at stent, serial 85%/70%/60% RCA stenoses.  7. 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  Biotronik ICD.  - Echo (11/20): EF  25-30%, global hypokinesis.  - Echo (5/21): EF 30% with diffuse hypokinesis, mild LVH, PASP 38, mildly decreased RV systolic function, IVC dilated. - Echo (8/22): EF 50-55%, mild LV dilation, mild LVH, normal RV function with mild RV enlargement.  8. Upper GI bleeding: 7/21, duodenal AVMs treated with APC.  - 5/22 UGI bleeding from AVMs. - 7/22 UGI bleeding from duodenal  AVMs 9. OSA: Does not use CPAP regularly.   Social History   Socioeconomic History   Marital status: Legally Separated    Spouse name: Not on file   Number of children: Not on file   Years of education: Not on file   Highest education level: Not on file  Occupational History   Not on file  Tobacco Use   Smoking status: Former   Smokeless tobacco: Never  Vaping Use   Vaping Use: Never used  Substance and Sexual Activity   Alcohol use: Not Currently    Alcohol/week: 2.0 standard drinks    Types: 2 Cans of beer per week   Drug use: No   Sexual activity: Yes  Other Topics Concern   Not on file  Social History Narrative   Not on file   Social Determinants of Health   Financial Resource Strain: Not on file  Food Insecurity: No Food Insecurity   Worried About Charity fundraiser in the Last Year: Never true   Ran Out of Food in the Last Year: Never true  Transportation Needs: Unmet Transportation Needs   Lack of Transportation (Medical): Yes   Lack of Transportation (Non-Medical): Yes  Physical Activity: Not on file  Stress: Not on file  Social Connections: Not on file  Intimate Partner Violence: Not on file   Family History  Problem Relation Age of Onset   Heart failure Mother    Mental illness Sister    Mental illness Sister    ROS: All systems reviewed and negative except as per HPI.  Current Meds  Medication Sig   acetaminophen (TYLENOL) 500 MG tablet Take 2 tablets (1,000 mg total) by mouth 2 (two) times daily as needed.   albuterol (VENTOLIN HFA) 108 (90 Base) MCG/ACT inhaler INHALE 2 PUFFS INTO THE LUNGS EVERY 6 (SIX) HOURS AS NEEDED FOR WHEEZING OR SHORTNESS OF BREATH.   apixaban (ELIQUIS) 5 MG TABS tablet Take 1 tablet (5 mg total) by mouth 2 (two) times daily.   atorvastatin (LIPITOR) 40 MG tablet Take 1 tablet (40 mg total) by mouth daily at 6 PM.   blood glucose meter kit and supplies KIT Dispense based on patient and insurance preference. Use up to four  times daily as directed. (FOR ICD-9 250.00, 250.01).   carvedilol (COREG) 6.25 MG tablet Take 1 tablet (6.25 mg total) by mouth 2 (two) times daily with a meal.   chlorhexidine (PERIDEX) 0.12 % solution GENTLY RINSE WITH 15 ML BY MOUTH FOR 2 MINUTES TWICE DAILY, EXPECTORATE AFTER USE   Continuous Blood Gluc Receiver (FREESTYLE LIBRE 14 DAY READER) DEVI 1 each by Does not apply route as needed.   cyclobenzaprine (FLEXERIL) 10 MG tablet Take 10 mg by mouth 3 (three) times daily as needed for muscle spasms.   dapagliflozin propanediol (FARXIGA) 10 MG TABS tablet Take 1 tablet (10 mg total) by mouth daily before breakfast.   ferrous sulfate 325 (65 FE) MG tablet Take 1 tablet (325 mg total) by mouth every other day.   furosemide (LASIX) 40 MG tablet Take 1.5 tablets (60 mg total) by mouth 2 (  two) times daily.   glipiZIDE (GLUCOTROL) 10 MG tablet TAKE 1 TABLET (10 MG TOTAL) BY MOUTH 2 (TWO) TIMES DAILY BEFORE A MEAL.   insulin glargine (LANTUS) 100 UNIT/ML injection INJECT 0.2 MLS (20 UNITS TOTAL) INTO THE SKIN DAILY.   Insulin Lispro Prot & Lispro (HUMALOG MIX 75/25 KWIKPEN) (75-25) 100 UNIT/ML Kwikpen Inject 30 Units into the skin 2 (two) times daily.   Insulin Pen Needle (TRUEPLUS PEN NEEDLES) 31G X 6 MM MISC USE AS DIRECTED   Insulin Syringe-Needle U-100 31G X 5/16" 0.3 ML MISC USE 3 TIMES DAILY TO INJECT INSULIN   metFORMIN (GLUCOPHAGE) 500 MG tablet TAKE 1 TABLET (500 MG TOTAL) BY MOUTH 2 (TWO) TIMES DAILY WITH A MEAL.   Multiple Vitamins-Minerals (CENTRUM SILVER 50+MEN) TABS Take 1 tablet by mouth daily.   pantoprazole (PROTONIX) 40 MG tablet Take 1 tablet (40 mg total) by mouth daily.   potassium chloride SA (KLOR-CON) 20 MEQ tablet Take 2 tablets (40 mEq total) by mouth daily.   QUEtiapine (SEROQUEL) 300 MG tablet Take 1 & 1/2 tablets by mouth daily.   sacubitril-valsartan (ENTRESTO) 24-26 MG Take 1 tablet by mouth 2 (two) times daily.   spironolactone (ALDACTONE) 25 MG tablet Take 0.5 tablets  (12.5 mg total) by mouth at bedtime.   tetrahydrozoline 0.05 % ophthalmic solution Place 1 drop into both eyes daily.   traMADol (ULTRAM) 50 MG tablet take 1 tab BY MOUTH EVERY 4-6 hOURS AS NEEDED FOR pain.   TRUEPLUS PEN NEEDLES 31G X 6 MM MISC USE AS DIRECTED   [DISCONTINUED] gabapentin (NEURONTIN) 300 MG capsule TAKE 2 CAPSULES (TOTAL = 600 MG), BY MOUTH, 3 TIMES A DAY.   BP 106/80   Pulse 80   Wt (!) 142.2 kg (313 lb 6.4 oz)   SpO2 96%   BMI 40.24 kg/m   Wt Readings from Last 3 Encounters:  09/22/21 (!) 142.2 kg (313 lb 6.4 oz)  09/21/21 (!) 142.9 kg (315 lb)  09/13/21 (!) 143.8 kg (317 lb)   Current Outpatient Medications on File Prior to Encounter  Medication Sig Dispense Refill   acetaminophen (TYLENOL) 500 MG tablet Take 2 tablets (1,000 mg total) by mouth 2 (two) times daily as needed. 180 tablet 3   albuterol (VENTOLIN HFA) 108 (90 Base) MCG/ACT inhaler INHALE 2 PUFFS INTO THE LUNGS EVERY 6 (SIX) HOURS AS NEEDED FOR WHEEZING OR SHORTNESS OF BREATH. 18 g 3   apixaban (ELIQUIS) 5 MG TABS tablet Take 1 tablet (5 mg total) by mouth 2 (two) times daily. 180 tablet 3   atorvastatin (LIPITOR) 40 MG tablet Take 1 tablet (40 mg total) by mouth daily at 6 PM. 90 tablet 3   blood glucose meter kit and supplies KIT Dispense based on patient and insurance preference. Use up to four times daily as directed. (FOR ICD-9 250.00, 250.01). 1 each 0   carvedilol (COREG) 6.25 MG tablet Take 1 tablet (6.25 mg total) by mouth 2 (two) times daily with a meal. 60 tablet 3   chlorhexidine (PERIDEX) 0.12 % solution GENTLY RINSE WITH 15 ML BY MOUTH FOR 2 MINUTES TWICE DAILY, EXPECTORATE AFTER USE 473 mL 0   Continuous Blood Gluc Receiver (FREESTYLE LIBRE 14 DAY READER) DEVI 1 each by Does not apply route as needed. 1 each 11   cyclobenzaprine (FLEXERIL) 10 MG tablet Take 10 mg by mouth 3 (three) times daily as needed for muscle spasms.     dapagliflozin propanediol (FARXIGA) 10 MG TABS tablet Take 1 tablet  (  10 mg total) by mouth daily before breakfast. 30 tablet 11   ferrous sulfate 325 (65 FE) MG tablet Take 1 tablet (325 mg total) by mouth every other day. 30 tablet 0   furosemide (LASIX) 40 MG tablet Take 1.5 tablets (60 mg total) by mouth 2 (two) times daily. 90 tablet 3   glipiZIDE (GLUCOTROL) 10 MG tablet TAKE 1 TABLET (10 MG TOTAL) BY MOUTH 2 (TWO) TIMES DAILY BEFORE A MEAL. 60 tablet 6   insulin glargine (LANTUS) 100 UNIT/ML injection INJECT 0.2 MLS (20 UNITS TOTAL) INTO THE SKIN DAILY. 30 mL 3   Insulin Lispro Prot & Lispro (HUMALOG MIX 75/25 KWIKPEN) (75-25) 100 UNIT/ML Kwikpen Inject 30 Units into the skin 2 (two) times daily. 15 mL 11   Insulin Pen Needle (TRUEPLUS PEN NEEDLES) 31G X 6 MM MISC USE AS DIRECTED 100 each 0   Insulin Syringe-Needle U-100 31G X 5/16" 0.3 ML MISC USE 3 TIMES DAILY TO INJECT INSULIN 300 each 3   metFORMIN (GLUCOPHAGE) 500 MG tablet TAKE 1 TABLET (500 MG TOTAL) BY MOUTH 2 (TWO) TIMES DAILY WITH A MEAL. 60 tablet 6   Multiple Vitamins-Minerals (CENTRUM SILVER 50+MEN) TABS Take 1 tablet by mouth daily.     pantoprazole (PROTONIX) 40 MG tablet Take 1 tablet (40 mg total) by mouth daily. 30 tablet 0   potassium chloride SA (KLOR-CON) 20 MEQ tablet Take 2 tablets (40 mEq total) by mouth daily. 60 tablet 3   QUEtiapine (SEROQUEL) 300 MG tablet Take 1 & 1/2 tablets by mouth daily. 45 tablet 0   sacubitril-valsartan (ENTRESTO) 24-26 MG Take 1 tablet by mouth 2 (two) times daily. 180 tablet 3   spironolactone (ALDACTONE) 25 MG tablet Take 0.5 tablets (12.5 mg total) by mouth at bedtime. 45 tablet 3   tetrahydrozoline 0.05 % ophthalmic solution Place 1 drop into both eyes daily.     traMADol (ULTRAM) 50 MG tablet take 1 tab BY MOUTH EVERY 4-6 hOURS AS NEEDED FOR pain. 12 tablet 0   TRUEPLUS PEN NEEDLES 31G X 6 MM MISC USE AS DIRECTED 100 each 0   metoprolol tartrate (LOPRESSOR) 100 MG tablet Take 1 tablet (100 mg total) by mouth two hours prior to CT test (Patient not  taking: Reported on 09/22/2021) 1 tablet 0   No current facility-administered medications on file prior to encounter.   BP 106/80   Pulse 80   Wt (!) 142.2 kg (313 lb 6.4 oz)   SpO2 96%   BMI 40.24 kg/m   Wt Readings from Last 3 Encounters:  09/22/21 (!) 142.2 kg (313 lb 6.4 oz)  09/21/21 (!) 142.9 kg (315 lb)  09/13/21 (!) 143.8 kg (317 lb)   Physical Exam: General: NAD, obese.  Neck: No JVD, no thyromegaly or thyroid nodule.  Lungs: Clear to auscultation bilaterally with normal respiratory effort. CV: Nondisplaced PMI.  Heart regular S1/S2, no S3/S4, no murmur.  No peripheral edema.  No carotid bruit.  Normal pedal pulses.  Abdomen: Soft, nontender, no hepatosplenomegaly, no distention.  Skin: Intact without lesions or rashes.  Neurologic: Alert and oriented x 3.  Psych: Normal affect. Extremities: No clubbing or cyanosis.  HEENT: Normal.   Assessment/Plan: 1. Chronic systolic CHF with recovery in LV function: Echo in 11/20 with EF 25-30%, echo 5/21 with EF 30% with mildly decreased RV systolic function. Biotronik ICD.  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  He tells me today that he rarely drinks ETOH. Echo 8/22 with improvement in LVEF to 50-55%.  NYHA class II. He is not volume overloaded today.  - Continue lasix 60 mg bid. Recent BMET stable.  - Continue Coreg 6.25 mg bid.   - Continue Entresto 24/26 mg bid - Continue spiro 12.5 mg daily  - Continue Farxiga 10 mg daily. 2. Recurrent upper GI bleeding: Gastric and duodenal AVMs, most recently July 2022.  - CBC, Fe studies today.  - Continue PPI.  - He has seen Dr. Quentin Ore, plan for Watchman placement to allow him to come off apixaban.  3. Paroxysmal atrial fibrillation: He has been in NSR.  He is on apixaban but has frequent upper GI bleeds from AVMs.  - Plan for Watchman with Dr. Quentin Ore. I have seen [name] is a 60 y.o. male in the office today. The patient is felt to be a poor candidate  for long-term anticoagulation because of 3.  Their CHADS-2-Vasc Score and unadjusted Ischemic Stroke Rate (% per year) is equal to 3.2 % stroke rate/year from a score of 3, necessitating a strategy of stroke prevention with either long-term oral anticoagulation or left atrial appendage occlusion therapy. We have discussed their bleeding risk in the context of their comorbid medical problems, as well as the rationale for referral for evaluation of Watchman left atrial appendage occlusion therapy. While the patient is at high long-term bleeding risk, they may be appropriate for short-term anticoagulation. Based on this individual patient's stroke and bleeding risk, a shared decision has been made to refer the patient for consideration of Watchman left atrial appendage closure utilizing the [employer] of Cardiology shared decision tool.  4. CAD: Last cath 11/20220 with occluded OM2 stent, 90% small OM1, diffuse disease RCA treated medically.  No chest pain.  - On statin.  - No ASA w/ Eliquis use and stable CAD. 5. HTN: Controlled.   6. OSA: History of OSA but has been off CPAP for a while.  Ordering repeat sleep study.  7. Obesity: I will refer to pharmacy clinic for semaglutide.   Followup in 3 months with APP.   Loralie Champagne 09/22/2021

## 2021-09-22 NOTE — Telephone Encounter (Signed)
Community paramedic informed CSW pt unable to pay for medications at this time- CSW able to assist with cost through patient care fund.  Will continue to follow and assist as needed  [name], [facility] Desk#: [phone] Cell#: [phone]

## 2021-09-22 NOTE — Progress Notes (Signed)
Paramedicine Encounter    Patient ID: OWENN ROTHERMEL, male    DOB: 1961-08-13, 60 y.o.   MRN: [id number]  Met with Mr. Chandler in clinic today who reports feeling good with some shortness of breath while walking long distances and stairs. Weight is down 8lbs and he reports no swelling. No edema noted on exam per Dr. Aundra Dubin. Meds reviewed. Pill box for one week filled.   Clinic will follow up on sleep study and weight loss medication.   CT Scan scheduled for Nov 3rd at 4:00.   I will follow up with patient in the home Wednesday Nov 2nd.   Refills: Metformin Gabapentin   ACTION: Home visit completed

## 2021-09-22 NOTE — Telephone Encounter (Signed)
-----   Message from Scarlette Calico, RN sent at 09/22/2021  4:27 PM EDT ----- Per Dr Aundra Dubin pt needs wt loss medication please, thanks

## 2021-09-23 ENCOUNTER — Telehealth (HOSPITAL_COMMUNITY): Payer: Self-pay | Admitting: Surgery

## 2021-09-23 NOTE — Telephone Encounter (Signed)
I called patient to inquire about ordered home sleep study.  He tells me that he has the device at home yet needs some assistance with downloading the App.  I reviewed the instructions to perform the study and [name] HF [employer] can assist him with the App and instructions to proceed.

## 2021-09-27 ENCOUNTER — Telehealth (HOSPITAL_COMMUNITY): Payer: Self-pay | Admitting: Emergency Medicine

## 2021-09-27 NOTE — Telephone Encounter (Signed)
Reaching out to patient to offer assistance regarding upcoming cardiac imaging study; pt verbalizes understanding of appt date/time, parking situation and where to check in, pre-test NPO status and medications ordered, and verified current allergies; name and call back number provided for further questions should they arise Marchia Bond RN Navigator Cardiac Imaging [facility] [phone] office [phone] cell  100mg  metoprolol  Holding diuretics Denies iv issues

## 2021-09-27 NOTE — Telephone Encounter (Addendum)
Received referral from Dr. Aundra Dubin to start semaglutide. Patient meets FDA approved criteria for semaglutide for weight loss given BMI 40.24. Obesity complicated by chronic medical conditions including CHF, CAD, T2DM. Last A1c controlled at 6.5 in August 2022, not currently on a GLP1-RA. No contraindications seen in the chart. If start GLP1-RA, will need to adjust glipizide and/or insulin dose to prevent hypoglycemia. Of note he is already on multiple branded medications. He is receiving his insulin, Lisabeth Register, and Eliquis through patient assistance. Has Medicare so [employer] not covered but Ozempic should be covered given hx DM. Would likely need to apply for patient assistance for Ozempic as well if he is interested in starting.   Called the patient to discuss interest in starting Ozempic. Unable to reach and unable to leave voicemail due to mailbox being full. Will try him again another day. Need to confirm at that time that he has no personal/family history of medullary thyroid carcinoma to rule out contraindications. If interested in starting Ozempic, need to discuss patient assistance process. He would need to bring in proof of income and sign paperwork to apply.

## 2021-09-28 ENCOUNTER — Other Ambulatory Visit (HOSPITAL_COMMUNITY): Payer: Self-pay

## 2021-09-28 DIAGNOSIS — D649 Anemia, unspecified: Secondary | ICD-10-CM

## 2021-09-28 NOTE — Progress Notes (Signed)
Paramedicine Encounter    Patient ID: [name], male    DOB: Apr 01, 1961, 60 y.o.   MRN: [id number]   Patient Care Team: Pcp, No as PCP - General [name], MD as PCP - Cardiology (Cardiology) [name], MD as PCP - Advanced Heart Failure (Cardiology) [name], MD as PCP - Electrophysiology (Cardiology) [name], LCSW as Social Worker (Licensed Clinical Social Worker) [name], MD as Consulting Physician (Gastroenterology)  Patient Active Problem List   Diagnosis Date Noted   Melena    Chronic diastolic CHF (congestive heart failure) ([place])    GI bleed 06/29/2021   Severe anemia 06/29/2021   Adenomatous polyp of ascending colon    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   Acute blood loss anemia    Angiodysplasia of stomach    Chronic anticoagulation    CHF exacerbation ([place]) 06/15/2020   AKI (acute kidney injury) ([place]) 06/15/2020   CKD (chronic kidney disease), stage III ([place]) 06/15/2020   Anemia due to chronic blood loss    Gastric AVM    Angiodysplasia of duodenum with hemorrhage    Acute on chronic systolic (congestive) heart failure ([place]) 04/05/2020   Anemia 04/05/2020   PAF (paroxysmal atrial fibrillation) ([place]) 04/05/2020   OSA on CPAP 04/05/2020   Syncope and collapse 04/05/2020   Type 2 diabetes mellitus with hyperglycemia, with long-term current use of insulin ([place]) 12/03/2019   Diabetic polyneuropathy associated with type 2 diabetes mellitus ([place]) 12/03/2019   Erectile dysfunction 12/03/2019   CHF (congestive heart failure) ([place]) 10/11/2019   Paranoid schizophrenia, chronic condition ([place]) 01/26/2015   Severe recurrent major depressive disorder with psychotic features ([place]) 01/26/2015   GAD (generalized anxiety disorder) 01/26/2015   OCD (obsessive compulsive disorder) 01/26/2015   Panic disorder without agoraphobia 01/26/2015   Insomnia 01/26/2015    Current Outpatient Medications:     acetaminophen (TYLENOL) 500 MG tablet, Take 2 tablets (1,000 mg total) by mouth 2 (two) times daily as needed., Disp: 180 tablet, Rfl: 3   albuterol (VENTOLIN HFA) 108 (90 Base) MCG/ACT inhaler, INHALE 2 PUFFS INTO THE LUNGS EVERY 6 (SIX) HOURS AS NEEDED FOR WHEEZING OR SHORTNESS OF BREATH., Disp: 18 g, Rfl: 3   apixaban (ELIQUIS) 5 MG TABS tablet, Take 1 tablet (5 mg total) by mouth 2 (two) times daily., Disp: 180 tablet, Rfl: 3   atorvastatin (LIPITOR) 40 MG tablet, Take 1 tablet (40 mg total) by mouth daily at 6 PM., Disp: 90 tablet, Rfl: 3   blood glucose meter kit and supplies KIT, Dispense based on patient and insurance preference. Use up to four times daily as directed. (FOR ICD-9 250.00, 250.01)., Disp: 1 each, Rfl: 0   carvedilol (COREG) 6.25 MG tablet, Take 1 tablet (6.25 mg total) by mouth 2 (two) times daily with a meal., Disp: 60 tablet, Rfl: 3   chlorhexidine (PERIDEX) 0.12 % solution, GENTLY RINSE WITH 15 ML BY MOUTH FOR 2 MINUTES TWICE DAILY, EXPECTORATE AFTER USE, Disp: 473 mL, Rfl: 0   Continuous Blood Gluc Receiver (FREESTYLE LIBRE 14 DAY READER) DEVI, 1 each by Does not apply route as needed., Disp: 1 each, Rfl: 11   cyclobenzaprine (FLEXERIL) 10 MG tablet, Take 10 mg by mouth 3 (three) times daily as needed for muscle spasms., Disp: , Rfl:    dapagliflozin propanediol (FARXIGA) 10 MG TABS tablet, Take 1 tablet (10 mg total) by mouth daily before breakfast., Disp: 30 tablet, Rfl: 11   ferrous sulfate 325 (65  FE) MG tablet, Take 1 tablet (325 mg total) by mouth every other day., Disp: 30 tablet, Rfl: 0   furosemide (LASIX) 40 MG tablet, Take 1.5 tablets (60 mg total) by mouth 2 (two) times daily., Disp: 90 tablet, Rfl: 3   gabapentin (NEURONTIN) 300 MG capsule, TAKE 2 CAPSULES (TOTAL = 600 MG), BY MOUTH, 3 TIMES A DAY., Disp: 180 capsule, Rfl: 11   glipiZIDE (GLUCOTROL) 10 MG tablet, TAKE 1 TABLET (10 MG TOTAL) BY MOUTH 2 (TWO) TIMES DAILY BEFORE A MEAL., Disp: 60 tablet, Rfl: 6    insulin glargine (LANTUS) 100 UNIT/ML injection, INJECT 0.2 MLS (20 UNITS TOTAL) INTO THE SKIN DAILY., Disp: 30 mL, Rfl: 3   Insulin Lispro Prot & Lispro (HUMALOG MIX 75/25 KWIKPEN) (75-25) 100 UNIT/ML Kwikpen, Inject 30 Units into the skin 2 (two) times daily., Disp: 15 mL, Rfl: 11   Insulin Pen Needle (TRUEPLUS PEN NEEDLES) 31G X 6 MM MISC, USE AS DIRECTED, Disp: 100 each, Rfl: 0   Insulin Syringe-Needle U-100 31G X 5/16" 0.3 ML MISC, USE 3 TIMES DAILY TO INJECT INSULIN, Disp: 300 each, Rfl: 3   metFORMIN (GLUCOPHAGE) 500 MG tablet, TAKE 1 TABLET (500 MG TOTAL) BY MOUTH 2 (TWO) TIMES DAILY WITH A MEAL., Disp: 60 tablet, Rfl: 6   metoprolol tartrate (LOPRESSOR) 100 MG tablet, Take 1 tablet (100 mg total) by mouth two hours prior to CT test (Patient not taking: Reported on 09/22/2021), Disp: 1 tablet, Rfl: 0   Multiple Vitamins-Minerals (CENTRUM SILVER 50+MEN) TABS, Take 1 tablet by mouth daily., Disp: , Rfl:    pantoprazole (PROTONIX) 40 MG tablet, Take 1 tablet (40 mg total) by mouth daily., Disp: 30 tablet, Rfl: 0   potassium chloride SA (KLOR-CON) 20 MEQ tablet, Take 2 tablets (40 mEq total) by mouth daily., Disp: 60 tablet, Rfl: 3   QUEtiapine (SEROQUEL) 300 MG tablet, Take 1 & 1/2 tablets by mouth daily., Disp: 45 tablet, Rfl: 0   sacubitril-valsartan (ENTRESTO) 24-26 MG, Take 1 tablet by mouth 2 (two) times daily., Disp: 180 tablet, Rfl: 3   spironolactone (ALDACTONE) 25 MG tablet, Take 0.5 tablets (12.5 mg total) by mouth at bedtime., Disp: 45 tablet, Rfl: 3   tetrahydrozoline 0.05 % ophthalmic solution, Place 1 drop into both eyes daily., Disp: , Rfl:    traMADol (ULTRAM) 50 MG tablet, take 1 tab BY MOUTH EVERY 4-6 hOURS AS NEEDED FOR pain., Disp: 12 tablet, Rfl: 0   TRUEPLUS PEN NEEDLES 31G X 6 MM MISC, USE AS DIRECTED, Disp: 100 each, Rfl: 0 No Known Allergies   Social History   Socioeconomic History   Marital status: Legally Separated    Spouse name: Not on file   Number of  children: Not on file   Years of education: Not on file   Highest education level: Not on file  Occupational History   Not on file  Tobacco Use   Smoking status: Former   Smokeless tobacco: Never  Vaping Use   Vaping Use: Never used  Substance and Sexual Activity   Alcohol use: Not Currently    Alcohol/week: 2.0 standard drinks    Types: 2 Cans of beer per week   Drug use: No   Sexual activity: Yes  Other Topics Concern   Not on file  Social History Narrative   Not on file   Social Determinants of Health   Financial Resource Strain: Not on file  Food Insecurity: No Food Insecurity   Worried About Running Out of  Food in the Last Year: Never true   Ran Out of Food in the Last Year: Never true  Transportation Needs: Unmet Transportation Needs   Lack of Transportation (Medical): Yes   Lack of Transportation (Non-Medical): Yes  Physical Activity: Not on file  Stress: Not on file  Social Connections: Not on file  Intimate Partner Violence: Not on file    Physical Exam   Arrived for home visit for [name] who reports to be feeling okay with no complaints of shortness of breath, chest pain, dizziness or trouble taking medications. [name] states he has been eating some salty, fried foods and sweets over the last week, we discussed the importance of limiting these in his diet. He verbalized understanding. Weight is up 3 lbs this week.   Vitals obtained. Lungs clear. No edema or swelling noted.  We reviewed medications and pill box was filled for one week. (LASIX HELD FOR THURS MORN AND METOPROLOL ADDED 2 HOURS BEFORE CT PROCEDURE IN THE NOON SPOT IN HIS PILL BOX)  We reviewed appointments, transportation set up.   11/3 [facility] 3:33 pickup for 4:00 apt.  11/4 [facility] 12:30 pickup for 1:00 apt.  We discussed home sleep study and instructions for same. App downloaded and he verbalized understanding on how to use. He will complete this week.   Home visit complete I will see  [name] in one week.   Refills: Gabapentin Pantoprazole  Freestyle [employer]         Future Appointments  Date Time Provider [place]  09/29/2021  4:30 PM [facility] 1 [facility]  09/30/2021  1:20 PM [name], NP [facility] None  12/02/2021  7:40 AM [facility] DEVICE REMOTES [facility]  12/12/2021 11:30 AM [facility] PA/NP [facility] None  03/03/2022  7:40 AM [facility] DEVICE REMOTES [facility]     ACTION: Home visit completed

## 2021-09-28 NOTE — Progress Notes (Signed)
Pt scheduled for Iron infusion per Dr Aundra Dubin (see lab results). Orders confirmed with Dr Aundra Dubin for 2 doses IV in infusion center.

## 2021-09-29 ENCOUNTER — Other Ambulatory Visit: Payer: Self-pay

## 2021-09-29 ENCOUNTER — Other Ambulatory Visit: Payer: Self-pay | Admitting: Nurse Practitioner

## 2021-09-29 ENCOUNTER — Ambulatory Visit (HOSPITAL_COMMUNITY)
Admission: RE | Admit: 2021-09-29 | Discharge: 2021-09-29 | Disposition: A | Discharge status: Home / Self Care | Payer: Medicare (Managed Care) | Source: Ambulatory Visit | Attending: Cardiology | Admitting: Cardiology

## 2021-09-29 DIAGNOSIS — Z01812 Encounter for preprocedural laboratory examination: Secondary | ICD-10-CM | POA: Insufficient documentation

## 2021-09-29 DIAGNOSIS — I480 Paroxysmal atrial fibrillation: Secondary | ICD-10-CM

## 2021-09-29 MED ORDER — NITROGLYCERIN 0.4 MG SL SUBL: 0.8000 mg | SUBLINGUAL_TABLET | Freq: Once | SUBLINGUAL | Status: DC

## 2021-09-29 MED ORDER — FREESTYLE LIBRE 14 DAY SENSOR MISC
1.0000 | 3 refills | Status: DC
  Filled 2021-09-29: qty 3, 42d supply, fill #0

## 2021-09-29 MED ORDER — IOHEXOL 350 MG/ML SOLN
95.0000 mL | Freq: Once | INTRAVENOUS | Status: AC | PRN
  Administered 2021-09-29: 95 mL via INTRAVENOUS

## 2021-09-30 ENCOUNTER — Other Ambulatory Visit: Payer: Self-pay

## 2021-09-30 ENCOUNTER — Encounter: Payer: Self-pay | Admitting: Nurse Practitioner

## 2021-09-30 ENCOUNTER — Ambulatory Visit (INDEPENDENT_AMBULATORY_CARE_PROVIDER_SITE_OTHER): Payer: Medicare (Managed Care) | Admitting: Nurse Practitioner

## 2021-09-30 VITALS — BP 127/70 | HR 79 | Temp 97.2°F | Ht 74.0 in | Wt 314.0 lb

## 2021-09-30 DIAGNOSIS — R7989 Other specified abnormal findings of blood chemistry: Secondary | ICD-10-CM

## 2021-09-30 DIAGNOSIS — R7309 Other abnormal glucose: Secondary | ICD-10-CM | POA: Diagnosis not present

## 2021-09-30 DIAGNOSIS — E1165 Type 2 diabetes mellitus with hyperglycemia: Secondary | ICD-10-CM

## 2021-09-30 DIAGNOSIS — M5441 Lumbago with sciatica, right side: Secondary | ICD-10-CM

## 2021-09-30 DIAGNOSIS — M5442 Lumbago with sciatica, left side: Secondary | ICD-10-CM | POA: Diagnosis not present

## 2021-09-30 DIAGNOSIS — Z794 Long term (current) use of insulin: Secondary | ICD-10-CM | POA: Diagnosis not present

## 2021-09-30 DIAGNOSIS — G4733 Obstructive sleep apnea (adult) (pediatric): Secondary | ICD-10-CM

## 2021-09-30 DIAGNOSIS — G8929 Other chronic pain: Secondary | ICD-10-CM

## 2021-09-30 DIAGNOSIS — Z9989 Dependence on other enabling machines and devices: Secondary | ICD-10-CM

## 2021-09-30 LAB — POCT GLYCOSYLATED HEMOGLOBIN (HGB A1C)
HbA1c POC (<> result, manual entry): 8.4 % (ref 4.0–5.6)
HbA1c, POC (controlled diabetic range): 8.4 % — AB (ref 0.0–7.0)
HbA1c, POC (prediabetic range): 8.4 % — AB (ref 5.7–6.4)
Hemoglobin A1C: 8.4 % — AB (ref 4.0–5.6)

## 2021-09-30 MED ORDER — INSULIN GLARGINE 100 UNIT/ML ~~LOC~~ SOLN
20.0000 [IU] | Freq: Every day | SUBCUTANEOUS | 3 refills | Status: DC
  Filled 2021-09-30: qty 10, 28d supply, fill #0

## 2021-09-30 MED ORDER — OXYCODONE-ACETAMINOPHEN 5-325 MG PO TABS: 1.0000 | ORAL_TABLET | Freq: Four times a day (QID) | ORAL | 0 refills | Status: AC | PRN

## 2021-09-30 MED ORDER — FREESTYLE LIBRE 14 DAY SENSOR MISC: 1.0000 | 3 refills | Status: DC

## 2021-09-30 MED ORDER — INSULIN LISPRO PROT & LISPRO (75-25 MIX) 100 UNIT/ML KWIKPEN
30.0000 [IU] | PEN_INJECTOR | Freq: Two times a day (BID) | SUBCUTANEOUS | 11 refills | Status: DC
  Filled 2021-09-30: qty 15, 25d supply, fill #0

## 2021-09-30 MED ORDER — GABAPENTIN 800 MG PO TABS
800.0000 mg | ORAL_TABLET | Freq: Three times a day (TID) | ORAL | 11 refills | Status: DC
  Filled 2021-09-30: qty 90, 30d supply, fill #0
  Filled 2021-11-10: qty 90, 30d supply, fill #1

## 2021-09-30 NOTE — Patient Instructions (Signed)
Diabetes Mellitus and Nutrition, Adult ?When you have diabetes, or diabetes mellitus, it is very important to have healthy eating habits because your blood sugar (glucose) levels are greatly affected by what you eat and drink. Eating healthy foods in the right amounts, at about the same times every day, can help you: ?Manage your blood glucose. ?Lower your risk of heart disease. ?Improve your blood pressure. ?Reach or maintain a healthy weight. ?What can affect my meal plan? ?Every person with diabetes is different, and each person has different needs for a meal plan. Your health care provider may recommend that you work with a dietitian to make a meal plan that is best for you. Your meal plan may vary depending on factors such as: ?The calories you need. ?The medicines you take. ?Your weight. ?Your blood glucose, blood pressure, and cholesterol levels. ?Your activity level. ?Other health conditions you have, such as heart or kidney disease. ?How do carbohydrates affect me? ?Carbohydrates, also called carbs, affect your blood glucose level more than any other type of food. Eating carbs raises the amount of glucose in your blood. ?It is important to know how many carbs you can safely have in each meal. This is different for every person. Your dietitian can help you calculate how many carbs you should have at each meal and for each snack. ?How does alcohol affect me? ?Alcohol can cause a decrease in blood glucose (hypoglycemia), especially if you use insulin or take certain diabetes medicines by mouth. Hypoglycemia can be a life-threatening condition. Symptoms of hypoglycemia, such as sleepiness, dizziness, and confusion, are similar to symptoms of having too much alcohol. ?Do not drink alcohol if: ?Your health care provider tells you not to drink. ?You are pregnant, may be pregnant, or are planning to become pregnant. ?If you drink alcohol: ?Limit how much you have to: ?0-1 drink a day for women. ?0-2 drinks a day  for men. ?Know how much alcohol is in your drink. In the [place] one drink equals one 12 oz bottle of beer (355 mL), one 5 oz glass of wine (148 mL), or one 1? oz glass of hard liquor (44 mL). ?Keep yourself hydrated with water, diet soda, or unsweetened iced tea. Keep in mind that regular soda, juice, and other mixers may contain a lot of sugar and must be counted as carbs. ?What are tips for following this plan? ?Reading food labels ?Start by checking the serving size on the Nutrition Facts label of packaged foods and drinks. The number of calories and the amount of carbs, fats, and other nutrients listed on the label are based on one serving of the item. Many items contain more than one serving per package. ?Check the total grams (g) of carbs in one serving. ?Check the number of grams of saturated fats and trans fats in one serving. Choose foods that have a low amount or none of these fats. ?Check the number of milligrams (mg) of salt (sodium) in one serving. Most people should limit total sodium intake to less than 2,300 mg per day. ?Always check the nutrition information of foods labeled as "low-fat" or "nonfat." These foods may be higher in added sugar or refined carbs and should be avoided. ?Talk to your dietitian to identify your daily goals for nutrients listed on the label. ?Shopping ?Avoid buying canned, pre-made, or processed foods. These foods tend to be high in fat, sodium, and added sugar. ?Shop around the outside edge of the grocery store. This is where you will   most often find fresh fruits and vegetables, bulk grains, fresh meats, and fresh dairy products. Cooking Use low-heat cooking methods, such as baking, instead of high-heat cooking methods, such as deep frying. Cook using healthy oils, such as olive, canola, or sunflower oil. Avoid cooking with butter, cream, or high-fat meats. Meal planning Eat meals and snacks regularly, preferably at the same times every day. Avoid going long periods of  time without eating. Eat foods that are high in fiber, such as fresh fruits, vegetables, beans, and whole grains. Eat 4-6 oz (112-168 g) of lean protein each day, such as lean meat, chicken, fish, eggs, or tofu. One ounce (oz) (28 g) of lean protein is equal to: 1 oz (28 g) of meat, chicken, or fish. 1 egg.  cup (62 g) of tofu. Eat some foods each day that contain healthy fats, such as avocado, nuts, seeds, and fish. What foods should I eat? Fruits Berries. Apples. Oranges. Peaches. Apricots. Plums. Grapes. Mangoes. Papayas. Pomegranates. Kiwi. Cherries. Vegetables Leafy greens, including lettuce, spinach, kale, chard, collard greens, mustard greens, and cabbage. Beets. Cauliflower. Broccoli. Carrots. Green beans. Tomatoes. Peppers. Onions. Cucumbers. Brussels sprouts. Grains Whole grains, such as whole-wheat or whole-grain bread, crackers, tortillas, cereal, and pasta. Unsweetened oatmeal. Quinoa. Brown or wild rice. Meats and other proteins Seafood. Poultry without skin. Lean cuts of poultry and beef. Tofu. Nuts. Seeds. Dairy Low-fat or fat-free dairy products such as milk, yogurt, and cheese. The items listed above may not be a complete list of foods and beverages you can eat and drink. Contact a dietitian for more information. What foods should I avoid? Fruits Fruits canned with syrup. Vegetables Canned vegetables. Frozen vegetables with butter or cream sauce. Grains Refined white flour and flour products such as bread, pasta, snack foods, and cereals. Avoid all processed foods. Meats and other proteins Fatty cuts of meat. Poultry with skin. Breaded or fried meats. Processed meat. Avoid saturated fats. Dairy Full-fat yogurt, cheese, or milk. Beverages Sweetened drinks, such as soda or iced tea. The items listed above may not be a complete list of foods and beverages you should avoid. Contact a dietitian for more information. Questions to ask a health care provider Do I need to  meet with a certified diabetes care and education specialist? Do I need to meet with a dietitian? What number can I call if I have questions? When are the best times to check my blood glucose? Where to find more information: American Diabetes Association: diabetes.org Academy of Nutrition and Dietetics: eatright.[employer] of Diabetes and Digestive and Kidney Diseases: [employer].is Association of Diabetes Care & Education Specialists: diabeteseducator.org Summary It is important to have healthy eating habits because your blood sugar (glucose) levels are greatly affected by what you eat and drink. It is important to use alcohol carefully. A healthy meal plan will help you manage your blood glucose and lower your risk of heart disease. Your health care provider may recommend that you work with a dietitian to make a meal plan that is best for you. This information is not intended to replace advice given to you by your health care provider. Make sure you discuss any questions you have with your health care provider. Document Revised: 06/16/2020 Document Reviewed: 06/16/2020 Elsevier Patient Education  [place].  Hypothyroidism Hypothyroidism is when the thyroid gland does not make enough of certain hormones (it is underactive). The thyroid gland is a small gland located in the lower front part of the neck, just in front  of the windpipe (trachea). This gland makes hormones that help control how the body uses food for energy (metabolism) as well as how the heart and brain function. These hormones also play a role in keeping your bones strong. When the thyroid is underactive, it produces too little of the hormones thyroxine (T4) and triiodothyronine (T3). What are the causes? This condition may be caused by: Hashimoto's disease. This is a disease in which the body's disease-fighting system (immune system) attacks the thyroid gland. This is the most common cause. Viral  infections. Pregnancy. Certain medicines. Birth defects. Past radiation treatments to the head or neck for cancer. Past treatment with radioactive iodine. Past exposure to radiation in the environment. Past surgical removal of part or all of the thyroid. Problems with a gland in the center of the brain (pituitary gland). Lack of enough iodine in the diet. What increases the risk? You are more likely to develop this condition if: You are male. You have a family history of thyroid conditions. You use a medicine called lithium. You take medicines that affect the immune system (immunosuppressants). What are the signs or symptoms? Symptoms of this condition include: Feeling as though you have no energy (lethargy). Not being able to tolerate cold. Weight gain that is not explained by a change in diet or exercise habits. Lack of appetite. Dry skin. Coarse hair. Menstrual irregularity. Slowing of thought processes. Constipation. Sadness or depression. How is this diagnosed? This condition may be diagnosed based on: Your symptoms, your medical history, and a physical exam. Blood tests. You may also have imaging tests, such as an ultrasound or MRI. How is this treated? This condition is treated with medicine that replaces the thyroid hormones that your body does not make. After you begin treatment, it may take several weeks for symptoms to go away. Follow these instructions at home: Take over-the-counter and prescription medicines only as told by your health care provider. If you start taking any new medicines, tell your health care provider. Keep all follow-up visits as told by your health care provider. This is important. As your condition improves, your dosage of thyroid hormone medicine may change. You will need to have blood tests regularly so that your health care provider can monitor your condition. Contact a health care provider if: Your symptoms do not get better with  treatment. You are taking thyroid hormone replacement medicine and you: Sweat a lot. Have tremors. Feel anxious. Lose weight rapidly. Cannot tolerate heat. Have emotional swings. Have diarrhea. Feel weak. Get help right away if you have: Chest pain. An irregular heartbeat. A rapid heartbeat. Difficulty breathing. Summary Hypothyroidism is when the thyroid gland does not make enough of certain hormones (it is underactive). When the thyroid is underactive, it produces too little of the hormones thyroxine (T4) and triiodothyronine (T3). The most common cause is Hashimoto's disease, a disease in which the body's disease-fighting system (immune system) attacks the thyroid gland. The condition can also be caused by viral infections, medicine, pregnancy, or past radiation treatment to the head or neck. Symptoms may include weight gain, dry skin, constipation, feeling as though you do not have energy, and not being able to tolerate cold. This condition is treated with medicine to replace the thyroid hormones that your body does not make. This information is not intended to replace advice given to you by your health care provider. Make sure you discuss any questions you have with your health care provider. Document Revised: 07/21/2021 Document Reviewed: 07/29/2020 Elsevier Patient  Education  2022 [employer].

## 2021-09-30 NOTE — Progress Notes (Signed)
[name], [place]  [address] Phone:  [phone]   Fax:  [phone]   Established Patient Office Visit  Subjective:  Patient ID: [name], male    DOB: 08-08-1961  Age: 60 y.o. MRN: [id number]  CC:  Chief Complaint  Patient presents with   Follow-up    6 month follow up, knee pain and lower back takes tylenol and gabapentin    HPI [name] presents for follow up. He  has a past medical history of Anxiety, CAD (coronary artery disease), CHF (congestive heart failure) ([place]) (09/2019), Depression, Diabetes mellitus, Erectile dysfunction (11/2019), H/O right heart catheterization (09/2019), Hypertension, Schizophrenia ([place]), and Sleep apnea.  He is in today for follow up . He suffered as MI in August and has been followed closely by cardiology. He denies any chest pain, shortness of breath or edema.  He concern today is his chronic back and knee pain. He denies any injury to back or knees. He has knee pain with walking up and down steps. He has some weakness. He has numbness and tingling in the legs and feet which may also be related to diabetic neuropathy. [name] Kitchen He is taking APAP 650 mg 3 tabs and this is not effective. He is taking gabapentin 300 mg  (2 tabs BID) he does not feel like this is effective. He has not had a recent xray of back or knees. He is requesting something stronger for pain.   He reports that his CBG is 125-200. He is unsure of his Lantus dose. He is also prescribed Humalog mix 72/25 30 units BID. His CBG varies. His A1c a few weeks ago was 8.4%.   He is requesting referral to [facility]  Past Medical History:  Diagnosis Date   Anxiety    CAD (coronary artery disease)    CHF (congestive heart failure) ([place]) 09/2019   Depression    Diabetes mellitus    Erectile dysfunction 11/2019   H/O right heart catheterization 09/2019   Hypertension    Schizophrenia ([place])    Sleep apnea    uses  cpap    Past Surgical History:  Procedure Laterality Date   BIOPSY  04/19/2021   Procedure: BIOPSY;  Surgeon: Doran Stabler, MD;  Location: [place];  Service: Gastroenterology;;   COLONOSCOPY WITH PROPOFOL N/A 04/16/2021   Procedure: COLONOSCOPY WITH PROPOFOL;  Surgeon: [name], MD;  Location: [place];  Service: Gastroenterology;  Laterality: N/A;   CORONARY STENT PLACEMENT     ENTEROSCOPY N/A 04/08/2020   Procedure: ENTEROSCOPY;  Surgeon: Doran Stabler, MD;  Location: [place];  Service: Gastroenterology;  Laterality: N/A;   ENTEROSCOPY N/A 06/17/2020   Procedure: ENTEROSCOPY;  Surgeon: Irene Shipper, MD;  Location: [facility] ENDOSCOPY;  Service: Endoscopy;  Laterality: N/A;   ENTEROSCOPY N/A 04/15/2021   Procedure: ENTEROSCOPY;  Surgeon: [name], MD;  Location: [place];  Service: Gastroenterology;  Laterality: N/A;   ENTEROSCOPY N/A 04/19/2021   Procedure: ENTEROSCOPY;  Surgeon: Doran Stabler, MD;  Location: [place];  Service: Gastroenterology;  Laterality: N/A;   ENTEROSCOPY N/A 07/01/2021   Procedure: ENTEROSCOPY;  Surgeon: Jerene Bears, MD;  Location: [facility] ENDOSCOPY;  Service: Gastroenterology;  Laterality: N/A;   GIVENS CAPSULE STUDY  04/16/2021   Procedure: GIVENS CAPSULE STUDY;  Surgeon: [name], MD;  Location: [place];  Service: Gastroenterology;;   HEMOSTASIS CLIP PLACEMENT  04/08/2020   Procedure: HEMOSTASIS  CLIP PLACEMENT;  Surgeon: Doran Stabler, MD;  Location: [place];  Service: Gastroenterology;;   HEMOSTASIS CLIP PLACEMENT  07/01/2021   Procedure: HEMOSTASIS CLIP PLACEMENT;  Surgeon: Jerene Bears, MD;  Location: [facility] ENDOSCOPY;  Service: Gastroenterology;;   HEMOSTASIS CONTROL  04/08/2020   Procedure: HEMOSTASIS CONTROL;  Surgeon: Doran Stabler, MD;  Location: [facility] ENDOSCOPY;  Service: Gastroenterology;;   HEMOSTASIS CONTROL  06/17/2020   Procedure: HEMOSTASIS CONTROL;  Surgeon: Irene Shipper, MD;  Location:  [place];  Service: Endoscopy;;   HERNIA REPAIR     HOT HEMOSTASIS N/A 04/15/2021   Procedure: HOT HEMOSTASIS (ARGON PLASMA COAGULATION/BICAP);  Surgeon: [name], MD;  Location: [place];  Service: Gastroenterology;  Laterality: N/A;   HOT HEMOSTASIS N/A 07/01/2021   Procedure: HOT HEMOSTASIS (ARGON PLASMA COAGULATION/BICAP);  Surgeon: Jerene Bears, MD;  Location: [facility] ENDOSCOPY;  Service: Gastroenterology;  Laterality: N/A;   ICD IMPLANT N/A 09/03/2020   Procedure: ICD IMPLANT;  Surgeon: [name], MD;  Location: [place] CV LAB;  Service: Cardiovascular;  Laterality: N/A;   POLYPECTOMY  04/16/2021   Procedure: POLYPECTOMY;  Surgeon: [name], MD;  Location: [facility] ENDOSCOPY;  Service: Gastroenterology;;   RIGHT/LEFT HEART CATH AND CORONARY ANGIOGRAPHY N/A 10/14/2019   Procedure: RIGHT/LEFT HEART CATH AND CORONARY ANGIOGRAPHY;  Surgeon: Belva Crome, MD;  Location: [place] CV LAB;  Service: Cardiovascular;  Laterality: N/A;   SUBMUCOSAL TATTOO INJECTION  04/19/2021   Procedure: SUBMUCOSAL TATTOO INJECTION;  Surgeon: Doran Stabler, MD;  Location: [facility] ENDOSCOPY;  Service: Gastroenterology;;    Family History  Problem Relation Age of Onset   Heart failure Mother    Mental illness Sister    Mental illness Sister     Social History   Socioeconomic History   Marital status: Legally Separated    Spouse name: Not on file   Number of children: Not on file   Years of education: Not on file   Highest education level: Not on file  Occupational History   Not on file  Tobacco Use   Smoking status: Former   Smokeless tobacco: Never  Vaping Use   Vaping Use: Never used  Substance and Sexual Activity   Alcohol use: Not Currently    Alcohol/week: 2.0 standard drinks    Types: 2 Cans of beer per week   Drug use: No   Sexual activity: Yes  Other Topics Concern   Not on file  Social History Narrative   Not on file   Social Determinants of Health    Financial Resource Strain: Not on file  Food Insecurity: No Food Insecurity   Worried About Charity fundraiser in the Last Year: Never true   Ran Out of Food in the Last Year: Never true  Transportation Needs: Unmet Transportation Needs   Lack of Transportation (Medical): Yes   Lack of Transportation (Non-Medical): Yes  Physical Activity: Not on file  Stress: Not on file  Social Connections: Not on file  Intimate Partner Violence: Not on file    Outpatient Medications Prior to Visit  Medication Sig Dispense Refill   acetaminophen (TYLENOL) 500 MG tablet Take 2 tablets (1,000 mg total) by mouth 2 (two) times daily as needed. 180 tablet 3   albuterol (VENTOLIN HFA) 108 (90 Base) MCG/ACT inhaler INHALE 2 PUFFS INTO THE LUNGS EVERY 6 (SIX) HOURS AS NEEDED FOR WHEEZING OR SHORTNESS OF BREATH. 18 g 3   apixaban (ELIQUIS) 5 MG TABS tablet  Take 1 tablet (5 mg total) by mouth 2 (two) times daily. 180 tablet 3   atorvastatin (LIPITOR) 40 MG tablet Take 1 tablet (40 mg total) by mouth daily at 6 PM. 90 tablet 3   blood glucose meter kit and supplies KIT Dispense based on patient and insurance preference. Use up to four times daily as directed. (FOR ICD-9 250.00, 250.01). 1 each 0   carvedilol (COREG) 6.25 MG tablet Take 1 tablet (6.25 mg total) by mouth 2 (two) times daily with a meal. 60 tablet 3   chlorhexidine (PERIDEX) 0.12 % solution GENTLY RINSE WITH 15 ML BY MOUTH FOR 2 MINUTES TWICE DAILY, EXPECTORATE AFTER USE 473 mL 0   Continuous Blood Gluc Receiver (FREESTYLE LIBRE 14 DAY READER) DEVI 1 each by Does not apply route as needed. 1 each 11   cyclobenzaprine (FLEXERIL) 10 MG tablet Take 10 mg by mouth 3 (three) times daily as needed for muscle spasms.     dapagliflozin propanediol (FARXIGA) 10 MG TABS tablet Take 1 tablet (10 mg total) by mouth daily before breakfast. 30 tablet 11   ferrous sulfate 325 (65 FE) MG tablet Take 1 tablet (325 mg total) by mouth every other day. 30 tablet 0    furosemide (LASIX) 40 MG tablet Take 1.5 tablets (60 mg total) by mouth 2 (two) times daily. 90 tablet 3   glipiZIDE (GLUCOTROL) 10 MG tablet TAKE 1 TABLET (10 MG TOTAL) BY MOUTH 2 (TWO) TIMES DAILY BEFORE A MEAL. 60 tablet 6   Insulin Pen Needle (TRUEPLUS PEN NEEDLES) 31G X 6 MM MISC USE AS DIRECTED 100 each 0   Insulin Syringe-Needle U-100 31G X 5/16" 0.3 ML MISC USE 3 TIMES DAILY TO INJECT INSULIN 300 each 3   metFORMIN (GLUCOPHAGE) 500 MG tablet TAKE 1 TABLET (500 MG TOTAL) BY MOUTH 2 (TWO) TIMES DAILY WITH A MEAL. 60 tablet 6   metoprolol tartrate (LOPRESSOR) 100 MG tablet Take 1 tablet (100 mg total) by mouth two hours prior to CT test 1 tablet 0   Multiple Vitamins-Minerals (CENTRUM SILVER 50+MEN) TABS Take 1 tablet by mouth daily.     pantoprazole (PROTONIX) 40 MG tablet Take 1 tablet (40 mg total) by mouth daily. 30 tablet 0   potassium chloride SA (KLOR-CON) 20 MEQ tablet Take 2 tablets (40 mEq total) by mouth daily. 60 tablet 3   QUEtiapine (SEROQUEL) 300 MG tablet Take 1 & 1/2 tablets by mouth daily. 45 tablet 0   sacubitril-valsartan (ENTRESTO) 24-26 MG Take 1 tablet by mouth 2 (two) times daily. 180 tablet 3   spironolactone (ALDACTONE) 25 MG tablet Take 0.5 tablets (12.5 mg total) by mouth at bedtime. 45 tablet 3   tetrahydrozoline 0.05 % ophthalmic solution Place 1 drop into both eyes daily.     TRUEPLUS PEN NEEDLES 31G X 6 MM MISC USE AS DIRECTED 100 each 0   Continuous Blood Gluc Sensor (FREESTYLE LIBRE 14 DAY SENSOR) MISC Use one applicator every 14 (fourteen) days. 6 each 3   gabapentin (NEURONTIN) 300 MG capsule TAKE 2 CAPSULES (TOTAL = 600 MG), BY MOUTH, 3 TIMES A DAY. 180 capsule 11   insulin glargine (LANTUS) 100 UNIT/ML injection INJECT 0.2 MLS (20 UNITS TOTAL) INTO THE SKIN DAILY. 30 mL 3   Insulin Lispro Prot & Lispro (HUMALOG MIX 75/25 KWIKPEN) (75-25) 100 UNIT/ML Kwikpen Inject 30 Units into the skin 2 (two) times daily. 15 mL 11   traMADol (ULTRAM) 50 MG tablet take 1  tab BY MOUTH  EVERY 4-6 hOURS AS NEEDED FOR pain. 12 tablet 0   No facility-administered medications prior to visit.    No Known Allergies  ROS Review of Systems  Skin:        Oily face with sore smell      Objective:    Physical Exam HENT:     Head: Normocephalic and atraumatic.     Nose: Nose normal.  Cardiovascular:     Rate and Rhythm: Normal rate and regular rhythm.     Pulses: Normal pulses.     Heart sounds: Normal heart sounds.  Pulmonary:     Effort: Pulmonary effort is normal.     Breath sounds: Normal breath sounds.  Abdominal:     Comments: Increased abdominal girth   Musculoskeletal:        General: Normal range of motion.     Cervical back: Normal range of motion.     Right lower leg: No edema.     Left lower leg: No edema.  Skin:    General: Skin is warm and dry.     Capillary Refill: Capillary refill takes less than 2 seconds.  Neurological:     General: No focal deficit present.     Mental Status: He is alert and oriented to person, place, and time.  Psychiatric:        Mood and Affect: Mood normal.        Behavior: Behavior normal.        Thought Content: Thought content normal.        Judgment: Judgment normal.    BP 127/70 (BP Location: Right Arm, Patient Position: Sitting)   Pulse 79   Temp (!) 97.2 F (36.2 C)   Ht '6\' 2"'  (1.88 m)   Wt (!) 314 lb 0.6 oz (142.4 kg)   SpO2 96%   BMI 40.32 kg/m  Wt Readings from Last 3 Encounters:  09/30/21 (!) 314 lb 0.6 oz (142.4 kg)  09/28/21 (!) 315 lb (142.9 kg)  09/22/21 (!) 313 lb 6.4 oz (142.2 kg)     There are no preventive care reminders to display for this patient.   There are no preventive care reminders to display for this patient.  Lab Results  Component Value Date   TSH 5.320 (H) 07/19/2020   Lab Results  Component Value Date   WBC 6.7 09/22/2021   HGB 14.1 09/22/2021   HCT 47.1 09/22/2021   MCV 80.9 09/22/2021   PLT 262 09/22/2021   Lab Results  Component Value Date    NA 145 (H) 09/21/2021   K 4.4 09/21/2021   CO2 25 09/21/2021   GLUCOSE 126 (H) 09/21/2021   BUN 18 09/21/2021   CREATININE 1.25 09/21/2021   BILITOT 0.6 06/30/2021   ALKPHOS 57 06/30/2021   AST 17 06/30/2021   ALT 17 06/30/2021   PROT 6.9 06/30/2021   ALBUMIN 2.9 (L) 06/30/2021   CALCIUM 9.7 09/21/2021   ANIONGAP 5 07/20/2021   EGFR 66 09/21/2021   Lab Results  Component Value Date   CHOL 116 06/06/2021   Lab Results  Component Value Date   HDL 35 (L) 06/06/2021   Lab Results  Component Value Date   LDLCALC 58 06/06/2021   Lab Results  Component Value Date   TRIG 117 06/06/2021   Lab Results  Component Value Date   CHOLHDL 3.3 06/06/2021   Lab Results  Component Value Date   HGBA1C 8.4 (A) 09/30/2021   HGBA1C 8.4 09/30/2021  HGBA1C 8.4 (A) 09/30/2021   HGBA1C 8.4 (A) 09/30/2021      Assessment & Plan:   Problem List Items Addressed This Visit       Respiratory   OSA on CPAP Home sleep study pending     Endocrine   Type 2 diabetes mellitus with hyperglycemia, with long-term current use of insulin (HCC) - Primary Encourage compliance with current treatment regimen   Encourage regular CBG monitoring Encourage contacting office if excessive hyperglycemia and or hypoglycemia Lifestyle modification with healthy diet (fewer calories, more high fiber foods, whole grains and non-starchy vegetables, lower fat meat and fish, low-fat diary include healthy oils) regular exercise (physical activity) and weight loss Opthalmology exam discussed TBS Home BP monitoring also encouraged goal <130/80    Relevant Medications   insulin glargine (LANTUS) 100 UNIT/ML injection   Insulin Lispro Prot & Lispro (HUMALOG MIX 75/25 KWIKPEN) (75-25) 100 UNIT/ML Kwikpen   Other Relevant Orders   HgB A1c (Completed)   Other Visit Diagnoses     Hemoglobin A1C greater than 9%, indicating poor diabetic control       Relevant Medications   insulin glargine (LANTUS) 100 UNIT/ML  injection   Insulin Lispro Prot & Lispro (HUMALOG MIX 75/25 KWIKPEN) (75-25) 100 UNIT/ML Kwikpen   Chronic bilateral low back pain with bilateral sciatica    Acute on Chronic pain exacerbation One week only no additional refills.     Relevant Medications   gabapentin (NEURONTIN) 800 MG tablet   oxyCODONE-acetaminophen (PERCOCET) 5-325 MG tablet   Other Relevant Orders   Ambulatory referral to Pain Clinic   Elevated TSH   Reevaluation      Relevant Orders   TSH       Meds ordered this encounter  Medications   gabapentin (NEURONTIN) 800 MG tablet    Sig: Take 1 tablet (800 mg total) by mouth every 8 (eight) hours.    Dispense:  90 tablet    Refill:  11    Do not place this medication, or any other prescription from our practice, on "Automatic Refill". Patient may have prescription filled one day early if pharmacy is closed on scheduled refill date.    Order Specific Question:   Supervising Provider    Answer:   Tresa Garter [[id number]]   insulin glargine (LANTUS) 100 UNIT/ML injection    Sig: Inject 0.2 mLs (20 Units total) into the skin daily.    Dispense:  30 mL    Refill:  3    Order Specific Question:   Supervising Provider    Answer:   Tresa Garter [[id number]]   Insulin Lispro Prot & Lispro (HUMALOG MIX 75/25 KWIKPEN) (75-25) 100 UNIT/ML Kwikpen    Sig: Inject 30 Units into the skin 2 (two) times daily.    Dispense:  15 mL    Refill:  11    Order Specific Question:   Supervising Provider    Answer:   Tresa Garter [[id number]]   oxyCODONE-acetaminophen (PERCOCET) 5-325 MG tablet    Sig: Take 1 tablet by mouth every 6 (six) hours as needed for up to 7 days for severe pain. Must last 30 days.    Dispense:  30 tablet    Refill:  0    Order Specific Question:   Supervising Provider    Answer:   Tresa Garter [[id number]]   Continuous Blood Gluc Sensor (FREESTYLE LIBRE 14 DAY SENSOR) MISC    Sig: Use one applicator every 14 (fourteen) days.  Dispense:  6 each    Refill:  3    Order Specific Question:   Supervising Provider    Answer:   Tresa Garter [id number]     Follow-up: Return in about 6 months (around 03/30/2022) for follow up [id number], Follow up [id number].    [name], NP

## 2021-09-30 NOTE — Telephone Encounter (Signed)
Called patient to discuss the below. He is very interested in starting Ozempic for weight loss. States he wants to lose about 75 lbs and is very motivated to lose weight. He denies any personal or family history of medullary thyroid carcinoma. Discussed administration, dosing titration schedule, and possible adverse effects of Ozempic. He has no questions or concerns at this time.   He will come in on Monday or Tuesday next week, bringing his income documentation, to apply for patient assistance. Once approved and medication has been sent to the office, will schedule first injection appt with [facility] pharmacists. Asked him to clean out his voicemail box so that in the future we can leave him a message if we are unable to reach him regarding updates on his application/patient assistance.   Once he starts Ozempic, we will need to adjust his glipizide and/or insulin dosing to prevent hypoglycemia. Pharmacy team will handle this at his first visit.

## 2021-10-01 LAB — TSH: TSH: 2.34 u[IU]/mL (ref 0.450–4.500)

## 2021-10-03 ENCOUNTER — Other Ambulatory Visit: Payer: Self-pay

## 2021-10-03 ENCOUNTER — Telehealth (HOSPITAL_COMMUNITY): Payer: Self-pay

## 2021-10-03 ENCOUNTER — Other Ambulatory Visit: Payer: Self-pay | Admitting: Nurse Practitioner

## 2021-10-03 ENCOUNTER — Other Ambulatory Visit (HOSPITAL_COMMUNITY): Payer: Self-pay | Admitting: *Deleted

## 2021-10-03 MED ORDER — PANTOPRAZOLE SODIUM 40 MG PO TBEC
40.0000 mg | DELAYED_RELEASE_TABLET | Freq: Every day | ORAL | 3 refills | Status: DC
  Filled 2021-10-03 – 2021-10-10 (×2): qty 90, 90d supply, fill #0

## 2021-10-03 NOTE — Telephone Encounter (Signed)
Called and reminded him to complete the at home sleep study, he reports he forgot which APP to use and he has trouble reading so I walked through this with him and his friend at home. They understood and he says he will complete it tonight. Call complete. I will follow up with home visit Wednesday.

## 2021-10-04 ENCOUNTER — Encounter (HOSPITAL_COMMUNITY)
Admission: RE | Admit: 2021-10-04 | Discharge: 2021-10-04 | Disposition: A | Discharge status: Home / Self Care | Payer: Medicare (Managed Care) | Source: Ambulatory Visit | Attending: Cardiology | Admitting: Cardiology

## 2021-10-04 ENCOUNTER — Other Ambulatory Visit: Payer: Self-pay

## 2021-10-04 DIAGNOSIS — D649 Anemia, unspecified: Secondary | ICD-10-CM | POA: Diagnosis present

## 2021-10-04 MED ORDER — SODIUM CHLORIDE 0.9 % IV SOLN
510.0000 mg | INTRAVENOUS | Status: DC
  Administered 2021-10-04: 510 mg via INTRAVENOUS
  Filled 2021-10-04: qty 510

## 2021-10-05 ENCOUNTER — Other Ambulatory Visit: Payer: Self-pay

## 2021-10-06 ENCOUNTER — Other Ambulatory Visit (HOSPITAL_COMMUNITY): Payer: Self-pay

## 2021-10-06 ENCOUNTER — Telehealth: Payer: Self-pay | Admitting: Pharmacist

## 2021-10-06 ENCOUNTER — Telehealth (HOSPITAL_COMMUNITY): Payer: Self-pay

## 2021-10-06 ENCOUNTER — Other Ambulatory Visit: Payer: Self-pay

## 2021-10-06 ENCOUNTER — Telehealth: Payer: Self-pay

## 2021-10-06 DIAGNOSIS — Z8719 Personal history of other diseases of the digestive system: Secondary | ICD-10-CM

## 2021-10-06 DIAGNOSIS — I480 Paroxysmal atrial fibrillation: Secondary | ICD-10-CM

## 2021-10-06 NOTE — Telephone Encounter (Signed)
Spoke to [employer] and confirmed shipment of Entresto to be delivered Wednesday 11/16 for a 90 day supply. Call complete.

## 2021-10-06 NOTE — Telephone Encounter (Signed)
-----   Message from [name], MD sent at 10/05/2021  7:58 AM EST ----- OK to proceed w/ watchman eval.  Hilton Cork. Quentin Ore, MD, [facility], [facility] Cardiac Electrophysiology

## 2021-10-06 NOTE — Progress Notes (Signed)
Paramedicine Encounter    Patient ID: [name], male    DOB: 1961-02-10, 60 y.o.   MRN: [id number]   Patient Care Team: [name], NP as PCP - General (Adult Health Nurse Practitioner) [name], MD as PCP - Cardiology (Cardiology) [name], MD as PCP - Advanced Heart Failure (Cardiology) [name], MD as PCP - Electrophysiology (Cardiology) [name], LCSW as Social Worker (Licensed Clinical Social Worker) [name], MD as Consulting Physician (Gastroenterology)  Patient Active Problem List   Diagnosis Date Noted   Melena    Chronic diastolic CHF (congestive heart failure) ([place])    GI bleed 06/29/2021   Severe anemia 06/29/2021   Adenomatous polyp of ascending colon    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   Acute blood loss anemia    Angiodysplasia of stomach    Chronic anticoagulation    CHF exacerbation ([place]) 06/15/2020   AKI (acute kidney injury) ([place]) 06/15/2020   CKD (chronic kidney disease), stage III ([place]) 06/15/2020   Anemia due to chronic blood loss    Gastric AVM    Angiodysplasia of duodenum with hemorrhage    Acute on chronic systolic (congestive) heart failure ([place]) 04/05/2020   Anemia 04/05/2020   PAF (paroxysmal atrial fibrillation) ([place]) 04/05/2020   OSA on CPAP 04/05/2020   Syncope and collapse 04/05/2020   Type 2 diabetes mellitus with hyperglycemia, with long-term current use of insulin ([place]) 12/03/2019   Diabetic polyneuropathy associated with type 2 diabetes mellitus ([place]) 12/03/2019   Erectile dysfunction 12/03/2019   CHF (congestive heart failure) ([place]) 10/11/2019   Paranoid schizophrenia, chronic condition ([place]) 01/26/2015   Severe recurrent major depressive disorder with psychotic features ([place]) 01/26/2015   GAD (generalized anxiety disorder) 01/26/2015   OCD (obsessive compulsive disorder) 01/26/2015   Panic disorder without agoraphobia 01/26/2015   Insomnia  01/26/2015    Current Outpatient Medications:    acetaminophen (TYLENOL) 500 MG tablet, Take 2 tablets (1,000 mg total) by mouth 2 (two) times daily as needed., Disp: 180 tablet, Rfl: 3   albuterol (VENTOLIN HFA) 108 (90 Base) MCG/ACT inhaler, INHALE 2 PUFFS INTO THE LUNGS EVERY 6 (SIX) HOURS AS NEEDED FOR WHEEZING OR SHORTNESS OF BREATH., Disp: 18 g, Rfl: 3   apixaban (ELIQUIS) 5 MG TABS tablet, Take 1 tablet (5 mg total) by mouth 2 (two) times daily., Disp: 180 tablet, Rfl: 3   atorvastatin (LIPITOR) 40 MG tablet, Take 1 tablet (40 mg total) by mouth daily at 6 PM., Disp: 90 tablet, Rfl: 3   blood glucose meter kit and supplies KIT, Dispense based on patient and insurance preference. Use up to four times daily as directed. (FOR ICD-9 250.00, 250.01)., Disp: 1 each, Rfl: 0   carvedilol (COREG) 6.25 MG tablet, Take 1 tablet (6.25 mg total) by mouth 2 (two) times daily with a meal., Disp: 60 tablet, Rfl: 3   chlorhexidine (PERIDEX) 0.12 % solution, GENTLY RINSE WITH 15 ML BY MOUTH FOR 2 MINUTES TWICE DAILY, EXPECTORATE AFTER USE, Disp: 473 mL, Rfl: 0   Continuous Blood Gluc Receiver (FREESTYLE LIBRE 14 DAY READER) DEVI, 1 each by Does not apply route as needed., Disp: 1 each, Rfl: 11   Continuous Blood Gluc Sensor (FREESTYLE LIBRE 14 DAY SENSOR) MISC, Use one applicator every 14 (fourteen) days., Disp: 6 each, Rfl: 3   cyclobenzaprine (FLEXERIL) 10 MG tablet, Take 10 mg by mouth 3 (three) times daily as needed for muscle spasms., Disp: , Rfl:  dapagliflozin propanediol (FARXIGA) 10 MG TABS tablet, Take 1 tablet (10 mg total) by mouth daily before breakfast., Disp: 30 tablet, Rfl: 11   ferrous sulfate 325 (65 FE) MG tablet, Take 1 tablet (325 mg total) by mouth every other day., Disp: 30 tablet, Rfl: 0   furosemide (LASIX) 40 MG tablet, Take 1.5 tablets (60 mg total) by mouth 2 (two) times daily., Disp: 90 tablet, Rfl: 3   gabapentin (NEURONTIN) 800 MG tablet, Take 1 tablet (800 mg total) by mouth  every 8 (eight) hours., Disp: 90 tablet, Rfl: 11   glipiZIDE (GLUCOTROL) 10 MG tablet, TAKE 1 TABLET (10 MG TOTAL) BY MOUTH 2 (TWO) TIMES DAILY BEFORE A MEAL., Disp: 60 tablet, Rfl: 6   insulin glargine (LANTUS) 100 UNIT/ML injection, Inject 0.2 mLs (20 Units total) into the skin daily., Disp: 30 mL, Rfl: 3   Insulin Lispro Prot & Lispro (HUMALOG MIX 75/25 KWIKPEN) (75-25) 100 UNIT/ML Kwikpen, Inject 30 Units into the skin 2 (two) times daily., Disp: 15 mL, Rfl: 11   Insulin Pen Needle (TRUEPLUS PEN NEEDLES) 31G X 6 MM MISC, USE AS DIRECTED, Disp: 100 each, Rfl: 0   Insulin Syringe-Needle U-100 31G X 5/16" 0.3 ML MISC, USE 3 TIMES DAILY TO INJECT INSULIN, Disp: 300 each, Rfl: 3   metFORMIN (GLUCOPHAGE) 500 MG tablet, TAKE 1 TABLET (500 MG TOTAL) BY MOUTH 2 (TWO) TIMES DAILY WITH A MEAL., Disp: 60 tablet, Rfl: 6   metoprolol tartrate (LOPRESSOR) 100 MG tablet, Take 1 tablet (100 mg total) by mouth two hours prior to CT test, Disp: 1 tablet, Rfl: 0   Multiple Vitamins-Minerals (CENTRUM SILVER 50+MEN) TABS, Take 1 tablet by mouth daily., Disp: , Rfl:    oxyCODONE-acetaminophen (PERCOCET) 5-325 MG tablet, Take 1 tablet by mouth every 6 (six) hours as needed for up to 7 days for severe pain. Must last 30 days., Disp: 30 tablet, Rfl: 0   pantoprazole (PROTONIX) 40 MG tablet, Take 1 tablet (40 mg total) by mouth daily., Disp: 90 tablet, Rfl: 3   potassium chloride SA (KLOR-CON) 20 MEQ tablet, Take 2 tablets (40 mEq total) by mouth daily., Disp: 60 tablet, Rfl: 3   QUEtiapine (SEROQUEL) 300 MG tablet, Take 1 & 1/2 tablets by mouth daily., Disp: 45 tablet, Rfl: 0   sacubitril-valsartan (ENTRESTO) 24-26 MG, Take 1 tablet by mouth 2 (two) times daily., Disp: 180 tablet, Rfl: 3   spironolactone (ALDACTONE) 25 MG tablet, Take 0.5 tablets (12.5 mg total) by mouth at bedtime., Disp: 45 tablet, Rfl: 3   tetrahydrozoline 0.05 % ophthalmic solution, Place 1 drop into both eyes daily., Disp: , Rfl:    TRUEPLUS PEN  NEEDLES 31G X 6 MM MISC, USE AS DIRECTED, Disp: 100 each, Rfl: 0 No Known Allergies   Social History   Socioeconomic History   Marital status: Legally Separated    Spouse name: Not on file   Number of children: Not on file   Years of education: Not on file   Highest education level: Not on file  Occupational History   Not on file  Tobacco Use   Smoking status: Former   Smokeless tobacco: Never  Vaping Use   Vaping Use: Never used  Substance and Sexual Activity   Alcohol use: Not Currently    Alcohol/week: 2.0 standard drinks    Types: 2 Cans of beer per week   Drug use: No   Sexual activity: Yes  Other Topics Concern   Not on file  Social History  Narrative   Not on file   Social Determinants of Health   Financial Resource Strain: Not on file  Food Insecurity: No Food Insecurity   Worried About Running Out of Food in the Last Year: Never true   Ran Out of Food in the Last Year: Never true  Transportation Needs: Unmet Transportation Needs   Lack of Transportation (Medical): Yes   Lack of Transportation (Non-Medical): Yes  Physical Activity: Not on file  Stress: Not on file  Social Connections: Not on file  Intimate Partner Violence: Not on file    Physical Exam Vitals reviewed.  Constitutional:      Appearance: Normal appearance. He is normal weight.  HENT:     Head: Normocephalic.     Nose: Nose normal.     Mouth/Throat:     Mouth: Mucous membranes are moist.     Pharynx: Oropharynx is clear.  Eyes:     Conjunctiva/sclera: Conjunctivae normal.     Pupils: Pupils are equal, round, and reactive to light.  Cardiovascular:     Rate and Rhythm: Normal rate and regular rhythm.     Pulses: Normal pulses.     Heart sounds: Normal heart sounds.  Pulmonary:     Effort: Pulmonary effort is normal.     Breath sounds: Normal breath sounds.  Abdominal:     General: Abdomen is flat.     Palpations: Abdomen is soft.  Musculoskeletal:        General: No swelling.  Normal range of motion.     Cervical back: Normal range of motion.     Right lower leg: No edema.     Left lower leg: No edema.  Skin:    General: Skin is warm and dry.     Capillary Refill: Capillary refill takes less than 2 seconds.  Neurological:     General: No focal deficit present.     Mental Status: He is alert. Mental status is at baseline.  Psychiatric:        Mood and Affect: Mood normal.    Arrived for home visit for [facility] who reports feeling good with no complaints reporting no shortness of breath, chest pain, or dizziness. [name] has been compliant with his medications over the last week.   I reviewed chart and confirmed medications. Filling one pill box for one week. Vitals and assessment as noted in report. No swelling, edema or JVD noted. Lung sounds clear. CBG- 134  Today [name] called [employer] Medicare to set up his request to transfer back to Medicare [employer]. He will have this plan starting Jan 1st 2023. Christella Scheuermann will be his coverage until Jan.   Appointments reviewed and confirmed. Home visit complete. I will see [name] in one week.   Refills: Pantoprazole   Future Appointments  Date Time Provider [place]  10/11/2021 10:00 AM [facility] None  12/02/2021  7:40 AM [facility] DEVICE REMOTES [facility]  12/12/2021 11:30 AM [facility] PA/NP [facility] None  03/03/2022  7:40 AM [facility] DEVICE REMOTES [facility]  03/30/2022  1:20 PM [name], NP [facility] None     ACTION: Home visit completed

## 2021-10-06 NOTE — Progress Notes (Signed)
[facility])  [place] Team    10/06/2021  [name] [birth year] [id number]  Reason for referral: Medication Assistance 2023   Referral source:  2023 Re-Enrollment  Current insurance: [employer]  Outreach:  Successful telephone call with [name].  HIPAA identifiers verified.    Objective: The ASCVD Risk score (Arnett DK, et al., 2019) failed to calculate for the following reasons:   The patient has a prior MI or stroke diagnosis  Lab Results  Component Value Date   CREATININE 1.25 09/21/2021   CREATININE 1.10 07/20/2021   CREATININE 1.00 07/04/2021    Lab Results  Component Value Date   HGBA1C 8.4 (A) 09/30/2021   HGBA1C 8.4 09/30/2021   HGBA1C 8.4 (A) 09/30/2021   HGBA1C 8.4 (A) 09/30/2021    Lipid Panel     Component Value Date/Time   CHOL 116 06/06/2021 1444   CHOL 116 07/19/2020 0940   TRIG 117 06/06/2021 1444   HDL 35 (L) 06/06/2021 1444   HDL 42 07/19/2020 0940   CHOLHDL 3.3 06/06/2021 1444   VLDL 23 06/06/2021 1444   LDLCALC 58 06/06/2021 1444   LDLCALC 51 07/19/2020 0940    BP Readings from Last 3 Encounters:  10/04/21 132/88  09/30/21 127/70  09/29/21 116/68    No Known Allergies   Medication Assistance Findings:  Medication assistance needs identified: Humalog Mix 75/25 Kwikpen  Patient declines a need for additional assistance with any other medications at this time.   Patient Assistance Programs: Humalog Mix 75/25 made by [place] requirement met: Yes Out-of-pocket prescription expenditure met:   Not Applicable Patient has met application requirements to apply for this program.    Additional medication assistance options reviewed with patient as warranted:  No other options identified  Plan: I will route patient assistance letter to [employer] technician who will coordinate patient assistance program application process for medications listed above.  [facility] pharmacy technician  will assist with obtaining all required documents from both patient and provider(s) and submit application(s) once completed.  Noted that a referral for Ozempic patient assistance was placed by Dr. Aundra Dubin (Cardiology) on 09/22/2021. Will investigate further if patient will also be receiving Ozempic along with current diabetes regimen.

## 2021-10-06 NOTE — Telephone Encounter (Signed)
Reviewed results with patient who verbalized understanding.   The patient agrees to [facility] 10/27/2021. Scheduled him for pre-procedure visit 11/23. He understands he will update EKG, have blood work, and get instruction letter for both Covid test and procedure at that time. He was grateful for call and agrees with plan.

## 2021-10-10 ENCOUNTER — Other Ambulatory Visit: Payer: Self-pay

## 2021-10-11 ENCOUNTER — Telehealth: Payer: Self-pay | Admitting: Student-PharmD

## 2021-10-11 ENCOUNTER — Encounter (HOSPITAL_COMMUNITY)
Admission: RE | Admit: 2021-10-11 | Discharge: 2021-10-11 | Disposition: A | Discharge status: Home / Self Care | Payer: Medicare (Managed Care) | Source: Ambulatory Visit | Attending: Cardiology | Admitting: Cardiology

## 2021-10-11 DIAGNOSIS — D649 Anemia, unspecified: Secondary | ICD-10-CM

## 2021-10-11 MED ORDER — SODIUM CHLORIDE 0.9 % IV SOLN
510.0000 mg | INTRAVENOUS | Status: DC
  Administered 2021-10-11: 510 mg via INTRAVENOUS
  Filled 2021-10-11: qty 510

## 2021-10-11 NOTE — Telephone Encounter (Signed)
Patient has not come to clinic yet to fill out patient assistance paperwork. Called him to remind him of this. He remembers our address and said he will come in next week with his income documents to fill out the [employer] patient assistance for [employer].

## 2021-10-11 NOTE — Progress Notes (Signed)
[facility]                                      Cardiology Office Note:    Date:  10/19/2021   ID:  [name], DOB 1961-09-14, MRN [id number]  PCP:  [name], NP  [facility] Cardiologist:  [name], MD  [facility] Electrophysiologist:  [name], MD   Referring MD: [name], NP   Chief Complaint  Patient presents with   Follow-up    Pre Watchman    History of Present Illness:    [name] is a 60 y.o. male with a hx of chronic systolic CHF with an initial LVEF at 25-30% with diffuse hypokinesis, CAD s/p OM2 PCI 2012, DM2, paroxysmal atrial fibrillation intolerant to anticoagulation due to GI bleeding, and schizophrenia. He is followed extensively by the [facility] due to severe systolic CHF as above. Echo 09/2019 showed EF 25-30% with diffuse hypokinesis. LHC performed at that time which showed occluded OM2 at prior stent, 90% D1 stenosis, and extensive diffuse RCA disease with no intervention performed. He was thought to be in paroxysmal atrial fibrillation and Eliquis was started. Repeat echo 5/21 showed EF 30% with diffuse hypokinesis, mild LVH, PASP 38, mildly decreased RV systolic function, IVC dilated. Biotronik ICD placed.    He was hospitalized in 06/16/21 with upper GI bleeding and CHF exacerbation. EGD showed duodenal AVMs, treated with APC. He was diuresed for CHF. Eliquis was held but restarted at d/c.    Given his GI bleeding, he was referred to Dr. Quentin Ore to discuss LAAO implant. He was seen 09/21/21 at which time he was felt to be a good candidate for Watchman implant. He underwent CT scans 09/29/21 which showed left atrial appendage which was noted to be a large chicken wing type without thrombus. A 31 mm watchman FLX device was recommended with RAO 15 CRA 7 deployment and was ultimately scheduled for LAAO closure with Watchman on 10/27/21.   Today he is here and states he has been doing  well. We discussed his procedure at length and all questions answered to the best of my ability. He denies chest pain, palpitations, LE edema, orthopnea, dizziness or syncope. Instructions reviewed with him as well.   Past Medical History:  Diagnosis Date   Anxiety    CAD (coronary artery disease)    CHF (congestive heart failure) ([place]) 09/2019   Depression    Diabetes mellitus    Erectile dysfunction 11/2019   H/O right heart catheterization 09/2019   Hypertension    Schizophrenia ([place])    Sleep apnea    uses cpap    Past Surgical History:  Procedure Laterality Date   BIOPSY  04/19/2021   Procedure: BIOPSY;  Surgeon: Doran Stabler, MD;  Location: [place];  Service: Gastroenterology;;   COLONOSCOPY WITH PROPOFOL N/A 04/16/2021   Procedure: COLONOSCOPY WITH PROPOFOL;  Surgeon: [name], MD;  Location: [place];  Service: Gastroenterology;  Laterality: N/A;   CORONARY STENT PLACEMENT     ENTEROSCOPY N/A 04/08/2020   Procedure: ENTEROSCOPY;  Surgeon: Doran Stabler, MD;  Location: [place];  Service: Gastroenterology;  Laterality: N/A;   ENTEROSCOPY N/A 06/17/2020   Procedure: ENTEROSCOPY;  Surgeon: Irene Shipper, MD;  Location: [facility] ENDOSCOPY;  Service: Endoscopy;  Laterality: N/A;   ENTEROSCOPY N/A 04/15/2021  Procedure: ENTEROSCOPY;  Surgeon: [name], MD;  Location: [place];  Service: Gastroenterology;  Laterality: N/A;   ENTEROSCOPY N/A 04/19/2021   Procedure: ENTEROSCOPY;  Surgeon: Doran Stabler, MD;  Location: [place];  Service: Gastroenterology;  Laterality: N/A;   ENTEROSCOPY N/A 07/01/2021   Procedure: ENTEROSCOPY;  Surgeon: Jerene Bears, MD;  Location: [facility] ENDOSCOPY;  Service: Gastroenterology;  Laterality: N/A;   GIVENS CAPSULE STUDY  04/16/2021   Procedure: GIVENS CAPSULE STUDY;  Surgeon: [name], MD;  Location: [place];  Service: Gastroenterology;;   HEMOSTASIS CLIP PLACEMENT  04/08/2020   Procedure: HEMOSTASIS  CLIP PLACEMENT;  Surgeon: Doran Stabler, MD;  Location: [place];  Service: Gastroenterology;;   HEMOSTASIS CLIP PLACEMENT  07/01/2021   Procedure: HEMOSTASIS CLIP PLACEMENT;  Surgeon: Jerene Bears, MD;  Location: [place];  Service: Gastroenterology;;   HEMOSTASIS CONTROL  04/08/2020   Procedure: HEMOSTASIS CONTROL;  Surgeon: Doran Stabler, MD;  Location: [facility] ENDOSCOPY;  Service: Gastroenterology;;   HEMOSTASIS CONTROL  06/17/2020   Procedure: HEMOSTASIS CONTROL;  Surgeon: Irene Shipper, MD;  Location: [place];  Service: Endoscopy;;   HERNIA REPAIR     HOT HEMOSTASIS N/A 04/15/2021   Procedure: HOT HEMOSTASIS (ARGON PLASMA COAGULATION/BICAP);  Surgeon: [name], MD;  Location: [place];  Service: Gastroenterology;  Laterality: N/A;   HOT HEMOSTASIS N/A 07/01/2021   Procedure: HOT HEMOSTASIS (ARGON PLASMA COAGULATION/BICAP);  Surgeon: Jerene Bears, MD;  Location: [facility] ENDOSCOPY;  Service: Gastroenterology;  Laterality: N/A;   ICD IMPLANT N/A 09/03/2020   Procedure: ICD IMPLANT;  Surgeon: [name], MD;  Location: [place] CV LAB;  Service: Cardiovascular;  Laterality: N/A;   POLYPECTOMY  04/16/2021   Procedure: POLYPECTOMY;  Surgeon: [name], MD;  Location: [facility] ENDOSCOPY;  Service: Gastroenterology;;   RIGHT/LEFT HEART CATH AND CORONARY ANGIOGRAPHY N/A 10/14/2019   Procedure: RIGHT/LEFT HEART CATH AND CORONARY ANGIOGRAPHY;  Surgeon: Belva Crome, MD;  Location: [place] CV LAB;  Service: Cardiovascular;  Laterality: N/A;   SUBMUCOSAL TATTOO INJECTION  04/19/2021   Procedure: SUBMUCOSAL TATTOO INJECTION;  Surgeon: Doran Stabler, MD;  Location: [place] ENDOSCOPY;  Service: Gastroenterology;;    Current Medications: Current Meds  Medication Sig   acetaminophen (TYLENOL) 500 MG tablet Take 2 tablets (1,000 mg total) by mouth 2 (two) times daily as needed.   albuterol (VENTOLIN HFA) 108 (90 Base) MCG/ACT inhaler INHALE 2 PUFFS INTO THE LUNGS  EVERY 6 (SIX) HOURS AS NEEDED FOR WHEEZING OR SHORTNESS OF BREATH.   apixaban (ELIQUIS) 5 MG TABS tablet Take 1 tablet (5 mg total) by mouth 2 (two) times daily.   atorvastatin (LIPITOR) 40 MG tablet Take 1 tablet (40 mg total) by mouth daily at 6 PM.   blood glucose meter kit and supplies KIT Dispense based on patient and insurance preference. Use up to four times daily as directed. (FOR ICD-9 250.00, 250.01).   carvedilol (COREG) 6.25 MG tablet Take 1 tablet (6.25 mg total) by mouth 2 (two) times daily with a meal.   Continuous Blood Gluc Receiver (FREESTYLE LIBRE 14 DAY READER) DEVI 1 each by Does not apply route as needed.   Continuous Blood Gluc Sensor (FREESTYLE LIBRE 14 DAY SENSOR) MISC Use one applicator every 14 (fourteen) days.   cyclobenzaprine (FLEXERIL) 10 MG tablet Take 10 mg by mouth 3 (three) times daily as needed for muscle spasms.   dapagliflozin propanediol (FARXIGA) 10 MG TABS tablet Take 1 tablet (10 mg total)  by mouth daily before breakfast.   ferrous sulfate 325 (65 FE) MG tablet Take 1 tablet (325 mg total) by mouth every other day.   furosemide (LASIX) 40 MG tablet Take 1.5 tablets (60 mg total) by mouth 2 (two) times daily.   gabapentin (NEURONTIN) 800 MG tablet Take 1 tablet (800 mg total) by mouth every 8 (eight) hours.   glipiZIDE (GLUCOTROL) 10 MG tablet TAKE 1 TABLET (10 MG TOTAL) BY MOUTH 2 (TWO) TIMES DAILY BEFORE A MEAL.   insulin glargine (LANTUS) 100 UNIT/ML injection Inject 0.2 mLs (20 Units total) into the skin daily.   Insulin Lispro Prot & Lispro (HUMALOG MIX 75/25 KWIKPEN) (75-25) 100 UNIT/ML Kwikpen Inject 30 Units into the skin 2 (two) times daily.   Insulin Pen Needle (TRUEPLUS PEN NEEDLES) 31G X 6 MM MISC USE AS DIRECTED   Insulin Syringe-Needle U-100 31G X 5/16" 0.3 ML MISC USE 3 TIMES DAILY TO INJECT INSULIN   metFORMIN (GLUCOPHAGE) 500 MG tablet TAKE 1 TABLET (500 MG TOTAL) BY MOUTH 2 (TWO) TIMES DAILY WITH A MEAL.   Multiple Vitamins-Minerals  (CENTRUM SILVER 50+MEN) TABS Take 1 tablet by mouth daily.   pantoprazole (PROTONIX) 40 MG tablet Take 1 tablet (40 mg total) by mouth daily.   potassium chloride SA (KLOR-CON) 20 MEQ tablet Take 2 tablets (40 mEq total) by mouth daily.   QUEtiapine (SEROQUEL) 300 MG tablet Take 1 & 1/2 tablets by mouth daily.   sacubitril-valsartan (ENTRESTO) 24-26 MG Take 1 tablet by mouth 2 (two) times daily.   spironolactone (ALDACTONE) 25 MG tablet Take 0.5 tablets (12.5 mg total) by mouth at bedtime.   tetrahydrozoline 0.05 % ophthalmic solution Place 1 drop into both eyes daily.   TRUEPLUS PEN NEEDLES 31G X 6 MM MISC USE AS DIRECTED     Allergies:   Patient has no known allergies.   Social History   Socioeconomic History   Marital status: Legally Separated    Spouse name: Not on file   Number of children: Not on file   Years of education: Not on file   Highest education level: Not on file  Occupational History   Not on file  Tobacco Use   Smoking status: Former   Smokeless tobacco: Never  Vaping Use   Vaping Use: Never used  Substance and Sexual Activity   Alcohol use: Not Currently    Alcohol/week: 2.0 standard drinks    Types: 2 Cans of beer per week   Drug use: No   Sexual activity: Yes  Other Topics Concern   Not on file  Social History Narrative   Not on file   Social Determinants of Health   Financial Resource Strain: Not on file  Food Insecurity: No Food Insecurity   Worried About Charity fundraiser in the Last Year: Never true   Ran Out of Food in the Last Year: Never true  Transportation Needs: Unmet Transportation Needs   Lack of Transportation (Medical): Yes   Lack of Transportation (Non-Medical): Yes  Physical Activity: Not on file  Stress: Not on file  Social Connections: Not on file     Family History: The patient's family history includes Heart failure in his mother; Mental illness in his sister and sister.  ROS:   Please see the history of present  illness.    All other systems reviewed and are negative.  EKGs/Labs/Other Studies Reviewed:    The following studies were reviewed today:  CTA 09/29/21:  IMPRESSION: 1. The left  atrial appendage is a large chicken wing morphology without thrombus.   2. A 31 mm watchman FLX device is recommended based on the above landing zone measurements (25.3 mm maximum diameter; 18% compression).   3. There is no thrombus in the left atrial appendage.   4. A mid and mid IAS puncture site is recommended. CIED lead in the RA may interfere with IAS puncture.   5. Optimal deployment angle: RAO 15 CRA 7   6. Normal coronary origin. Right dominance.  Echocardiogram 06/30/21:    1. Left ventricular ejection fraction, by estimation, is 50 to 55%. The  left ventricle has low normal function. The left ventricle has no regional  wall motion abnormalities. The left ventricular internal cavity size was  mildly dilated. There is mild  left ventricular hypertrophy. Left ventricular diastolic parameters were  normal.   2. Right ventricular systolic function is normal. The right ventricular  size is mildly enlarged. There is normal pulmonary artery systolic  pressure. The estimated right ventricular systolic pressure is 28.3 mmHg.   3. Left atrial size was mildly dilated.   4. Right atrial size was mildly dilated.   5. The mitral valve is normal in structure. No evidence of mitral valve  regurgitation. No evidence of mitral stenosis.   6. The aortic valve is tricuspid. Aortic valve regurgitation is not  visualized. No aortic stenosis is present. Aortic valve mean gradient  measures 9.0 mmHg.   7. Aortic dilatation noted. There is mild dilatation of the ascending  aorta, measuring 40 mm.   8. The inferior vena cava is dilated in size with >50% respiratory  variability, suggesting right atrial pressure of 8 mmHg.    EKG:  EKG is ordered today.  The ekg ordered today demonstrates NSR with HR 80bpm.    Recent Labs: 02/23/2021: NT-Pro BNP 249 04/17/2021: Magnesium 2.1 06/30/2021: ALT 17 07/20/2021: B Natriuretic Peptide 103.1 09/21/2021: BUN 18; Creatinine, Ser 1.25; Potassium 4.4; Sodium 145 09/22/2021: Hemoglobin 14.1; Platelets 262 09/30/2021: TSH 2.340   Recent Lipid Panel    Component Value Date/Time   CHOL 116 06/06/2021 1444   CHOL 116 07/19/2020 0940   TRIG 117 06/06/2021 1444   HDL 35 (L) 06/06/2021 1444   HDL 42 07/19/2020 0940   CHOLHDL 3.3 06/06/2021 1444   VLDL 23 06/06/2021 1444   LDLCALC 58 06/06/2021 1444   LDLCALC 51 07/19/2020 0940   Risk Assessment/Calculations:    CHA2DS2-VASc Score = 4  This indicates a 4.8% annual risk of stroke. The patient's score is based upon: CHF History: 1 HTN History: 1 Diabetes History: 1 Stroke History: 0 Vascular Disease History: 1 Age Score: 0 Gender Score: 0   HAS-BLED score 2 Hypertension Yes  Abnormal renal and liver function (Dialysis, transplant, Cr >2.26 mg/dL /Cirrhosis or Bilirubin >2x Normal or AST/ALT/AP >3x Normal) No  Stroke No  Bleeding Yes  Labile INR (Unstable/high INR) No  Elderly (>65) No  Drugs or alcohol (? 8 drinks/week, anti-plt or NSAID) No   Physical Exam:    VS:  BP 122/82   Pulse 80   Ht '6\' 2"'  (1.88 m)   Wt (!) 316 lb (143.3 kg)   SpO2 95%   BMI 40.57 kg/m     Wt Readings from Last 3 Encounters:  10/19/21 (!) 316 lb (143.3 kg)  10/06/21 (!) 315 lb (142.9 kg)  10/04/21 (!) 315 lb (142.9 kg)    General: Well developed, well nourished, NAD Neck: Negative for carotid bruits. No JVD  Lungs:Clear to ausculation bilaterally. Breathing is unlabored. Cardiovascular: RRR with S1 S2. No murmurs Extremities: No edema. Neuro: Alert and oriented. No focal deficits. No facial asymmetry. MAE spontaneously. Psych: Responds to questions appropriately with normal affect.    ASSESSMENT/PLAN:    1. Paroxsymal atrial fibrillation: Pt has a known hx of PAF intolerable to anticoagulation due to hx of  GI bleeding. Plan for Watchman scheduled for 10/27/21. Labs to be obtained today to include BMET, CBC. He will have COVID screen on 10/25/21. Continue Eliquis 76m PO BID. AC/DAPT plan will be determined after implant.   2. Intolerance to long term anticoagulation with hx of GI bleed: Hospital admission 03/2021 found to have GIB. Underwent GI workup with EGD showing duodenal AVMs, treated with APC. Eliquis was held but restarted at d/c. No s/s of active bleeding today. CBC today.   4. 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 Follows closely with Dr. MAundra Dubinand Dr. LQuentin Orefor EP/device/watchamn. Last interrogation 08/31/21 with stable lead parameters, normal device functioning. No changes needed.   5. CAD: Last LHC 09/2019 with occluded OM2 stent, 90% small OM1, diffuse disease RCA treated medically. Denies anginal symptoms. No ASA due to Eliquis.  Medication Adjustments/Labs and Tests Ordered: Current medicines are reviewed at length with the patient today.  Concerns regarding medicines are outlined above.  Orders Placed This Encounter  Procedures   Basic metabolic panel   CBC   EKG 12-Lead   No orders of the defined types were placed in this encounter.   Patient Instructions  Medication Instructions:  Your physician recommends that you continue on your current medications as directed. Please refer to the Current Medication list given to you today.  *If you need a refill on your cardiac medications before your next appointment, please call your pharmacy*   Lab Work:  CBC and BMET  If you have labs (blood work) drawn today and your tests are completely normal, you will receive your results only by: [place](if you have MyChart) OR A paper copy in the mail If you have any lab test that is abnormal or we need to change your treatment, we will call you  to review the results.   Testing/Procedures: None ordered today  AVS mailed to pt 10/19/2021    Follow-Up: [facility] and your [facility] needs are our [facility].  As part of our continuing mission to provide you with exceptional heart care, we have created designated Provider Care Teams.  These Care Teams include your primary Cardiologist (physician) and Advanced Practice Providers (APPs -  Physician Assistants and Nurse Practitioners) who all work together to provide you with the care you need, when you need it.  We recommend signing up for the patient portal called "MyChart".  Sign up information is provided on this After Visit Summary.  MyChart is used to connect with patients for Virtual Visits (Telemedicine).  Patients are able to view lab/test results, encounter notes, upcoming appointments, etc.  Non-urgent messages can be sent to your provider as well.   To learn more about what you can do with MyChart, go to [email or web address]    Your next appointment:   As scheduled   Signed, JKathyrn Drown NP  10/19/2021 5:01 PM    [place]

## 2021-10-13 ENCOUNTER — Other Ambulatory Visit (HOSPITAL_COMMUNITY): Payer: Self-pay

## 2021-10-13 NOTE — Progress Notes (Signed)
Paramedicine Encounter    Patient ID: [name], male    DOB: May 23, 1961, 60 y.o.   MRN: [id number]  Met with [name] in the home today to complete a med rec. [name] reports not feeling well with flu like symptoms. Body aches, cough, congestion and cold chills. [name] reports he started feeling bad Tuesday night and has been using over the counter medications to help. I reviewed medications and filled one week of medication for pill box. I will return next week for full visit. We discussed upcoming appointments and he agreed with plan. Visit complete.   Refills: Potassium Carvedilol Lasix Glipizide Metformin Pantoprazole      ACTION: Home visit completed

## 2021-10-14 NOTE — Addendum Note (Signed)
Encounter addended by: Eileen Stanford, PA-C on: 10/14/2021 10:39 AM  Actions taken: Clinical Note Signed

## 2021-10-17 ENCOUNTER — Other Ambulatory Visit: Payer: Self-pay

## 2021-10-18 ENCOUNTER — Other Ambulatory Visit: Payer: Self-pay

## 2021-10-18 ENCOUNTER — Other Ambulatory Visit (HOSPITAL_COMMUNITY): Payer: Self-pay | Admitting: Family Medicine

## 2021-10-18 ENCOUNTER — Other Ambulatory Visit (HOSPITAL_COMMUNITY): Payer: Self-pay

## 2021-10-18 MED ORDER — FUROSEMIDE 40 MG PO TABS
60.0000 mg | ORAL_TABLET | Freq: Two times a day (BID) | ORAL | 3 refills | Status: DC
  Filled 2021-10-18 – 2021-10-25 (×2): qty 90, 30d supply, fill #0
  Filled 2021-11-22: qty 90, 30d supply, fill #1

## 2021-10-18 MED ORDER — CARVEDILOL 6.25 MG PO TABS
6.2500 mg | ORAL_TABLET | Freq: Two times a day (BID) | ORAL | 3 refills | Status: DC
  Filled 2021-10-18 – 2021-10-25 (×2): qty 60, 30d supply, fill #0
  Filled 2021-11-15 – 2021-11-16 (×2): qty 60, 30d supply, fill #1

## 2021-10-18 NOTE — Telephone Encounter (Signed)
Patient's HF community paramedic Nira Conn helped the patient complete his patient assistance paperwork and scanned that and his income/insurance documents to me. Patient assistance application for Ozempic submitted today. Ozempic will be delivered to the [facility] office once approved.

## 2021-10-18 NOTE — Progress Notes (Signed)
Paramedicine Encounter    Patient ID: [name], male    DOB: Jan 19, 1961, 60 y.o.   MRN: [id number]  Met with [name] for home visit for medication review. I verified medications and confirmed notes. Pill box filled accordingly for one week. We also reviewed new mail he has received and updated appointment list. Written list given and he agreed with plans. I will see [name] in one week.   Home visit complete.   Refills: (called into [place] on 11/22 at 1550) Carvedilol Glipizide Potassium Meformin Lasix   ACTION: Home visit completed

## 2021-10-19 ENCOUNTER — Other Ambulatory Visit: Payer: Self-pay

## 2021-10-19 ENCOUNTER — Ambulatory Visit (INDEPENDENT_AMBULATORY_CARE_PROVIDER_SITE_OTHER): Payer: Medicare (Managed Care) | Admitting: Cardiology

## 2021-10-19 ENCOUNTER — Encounter: Payer: Self-pay | Admitting: Cardiology

## 2021-10-19 VITALS — BP 122/82 | HR 80 | Ht 74.0 in | Wt 316.0 lb

## 2021-10-19 DIAGNOSIS — I5032 Chronic diastolic (congestive) heart failure: Secondary | ICD-10-CM

## 2021-10-19 DIAGNOSIS — I480 Paroxysmal atrial fibrillation: Secondary | ICD-10-CM

## 2021-10-19 DIAGNOSIS — Z8719 Personal history of other diseases of the digestive system: Secondary | ICD-10-CM

## 2021-10-19 DIAGNOSIS — I2510 Atherosclerotic heart disease of native coronary artery without angina pectoris: Secondary | ICD-10-CM

## 2021-10-19 DIAGNOSIS — Z01818 Encounter for other preprocedural examination: Secondary | ICD-10-CM | POA: Diagnosis not present

## 2021-10-19 DIAGNOSIS — Z01812 Encounter for preprocedural laboratory examination: Secondary | ICD-10-CM

## 2021-10-19 DIAGNOSIS — I5023 Acute on chronic systolic (congestive) heart failure: Secondary | ICD-10-CM

## 2021-10-19 NOTE — Patient Instructions (Addendum)
Medication Instructions:  Your physician recommends that you continue on your current medications as directed. Please refer to the Current Medication list given to you today.  *If you need a refill on your cardiac medications before your next appointment, please call your pharmacy*   Lab Work:  CBC and BMET  If you have labs (blood work) drawn today and your tests are completely normal, you will receive your results only by: [place] (if you have MyChart) OR A paper copy in the mail If you have any lab test that is abnormal or we need to change your treatment, we will call you to review the results.   Testing/Procedures: None ordered today  AVS mailed to pt 10/19/2021    Follow-Up: At [facility], you and your health needs are our priority.  As part of our continuing mission to provide you with exceptional heart care, we have created designated Provider Care Teams.  These Care Teams include your primary Cardiologist (physician) and Advanced Practice Providers (APPs -  Physician Assistants and Nurse Practitioners) who all work together to provide you with the care you need, when you need it.  We recommend signing up for the patient portal called "MyChart".  Sign up information is provided on this After Visit Summary.  MyChart is used to connect with patients for Virtual Visits (Telemedicine).  Patients are able to view lab/test results, encounter notes, upcoming appointments, etc.  Non-urgent messages can be sent to your provider as well.   To learn more about what you can do with MyChart, go to [email or web address].    Your next appointment:   As scheduled

## 2021-10-20 LAB — BASIC METABOLIC PANEL
BUN/Creatinine Ratio: 20 (ref 10–24)
BUN: 25 mg/dL (ref 8–27)
CO2: 25 mmol/L (ref 20–29)
Calcium: 9.1 mg/dL (ref 8.6–10.2)
Chloride: 103 mmol/L (ref 96–106)
Creatinine, Ser: 1.24 mg/dL (ref 0.76–1.27)
Glucose: 101 mg/dL — ABNORMAL HIGH (ref 70–99)
Potassium: 4.6 mmol/L (ref 3.5–5.2)
Sodium: 141 mmol/L (ref 134–144)
eGFR: 66 mL/min/{1.73_m2} (ref >59)

## 2021-10-20 LAB — CBC
Hematocrit: 41.4 % (ref 37.5–51.0)
Hemoglobin: 13 g/dL (ref 13.0–17.7)
MCH: 25.2 pg — ABNORMAL LOW (ref 26.6–33.0)
MCHC: 31.4 g/dL — ABNORMAL LOW (ref 31.5–35.7)
MCV: 80 fL (ref 79–97)
Platelets: 242 10*3/uL (ref 150–450)
RBC: 5.16 x10E6/uL (ref 4.14–5.80)
RDW: 16.8 % — ABNORMAL HIGH (ref 11.6–15.4)
WBC: 5.5 10*3/uL (ref 3.4–10.8)

## 2021-10-25 ENCOUNTER — Other Ambulatory Visit (HOSPITAL_COMMUNITY): Payer: Self-pay

## 2021-10-25 ENCOUNTER — Other Ambulatory Visit: Payer: Self-pay

## 2021-10-25 ENCOUNTER — Other Ambulatory Visit (HOSPITAL_COMMUNITY)
Admission: RE | Admit: 2021-10-25 | Discharge: 2021-10-25 | Disposition: A | Discharge status: Home / Self Care | Payer: Medicare (Managed Care) | Source: Ambulatory Visit | Attending: Cardiology | Admitting: Cardiology

## 2021-10-25 DIAGNOSIS — Z01818 Encounter for other preprocedural examination: Secondary | ICD-10-CM

## 2021-10-25 DIAGNOSIS — Z01812 Encounter for preprocedural laboratory examination: Secondary | ICD-10-CM | POA: Insufficient documentation

## 2021-10-25 DIAGNOSIS — Z20822 Contact with and (suspected) exposure to covid-19: Secondary | ICD-10-CM | POA: Insufficient documentation

## 2021-10-25 LAB — SARS CORONAVIRUS 2 (TAT 6-24 HRS): SARS Coronavirus 2: NEGATIVE

## 2021-10-25 NOTE — Progress Notes (Signed)
Paramedicine Encounter    Patient ID: [name], male    DOB: 1961/09/29, 60 y.o.   MRN: [id number]   Patient Care Team: [name], NP as PCP - General (Adult Health Nurse Practitioner) [name], MD as PCP - Cardiology (Cardiology) [name], MD as PCP - Advanced Heart Failure (Cardiology) [name], MD as PCP - Electrophysiology (Cardiology) [name], LCSW as Social Worker (Licensed Clinical Social Worker) [name], MD as Consulting Physician (Gastroenterology)  Patient Active Problem List   Diagnosis Date Noted   Melena    Chronic diastolic CHF (congestive heart failure) ([place])    GI bleed 06/29/2021   Severe anemia 06/29/2021   Adenomatous polyp of ascending colon    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   Acute blood loss anemia    Angiodysplasia of stomach    Chronic anticoagulation    CHF exacerbation ([place]) 06/15/2020   AKI (acute kidney injury) ([place]) 06/15/2020   CKD (chronic kidney disease), stage III ([place]) 06/15/2020   Anemia due to chronic blood loss    Gastric AVM    Angiodysplasia of duodenum with hemorrhage    Acute on chronic systolic (congestive) heart failure ([place]) 04/05/2020   Anemia 04/05/2020   PAF (paroxysmal atrial fibrillation) ([place]) 04/05/2020   OSA on CPAP 04/05/2020   Syncope and collapse 04/05/2020   Type 2 diabetes mellitus with hyperglycemia, with long-term current use of insulin ([place]) 12/03/2019   Diabetic polyneuropathy associated with type 2 diabetes mellitus ([place]) 12/03/2019   Erectile dysfunction 12/03/2019   CHF (congestive heart failure) ([place]) 10/11/2019   Paranoid schizophrenia, chronic condition ([place]) 01/26/2015   Severe recurrent major depressive disorder with psychotic features ([place]) 01/26/2015   GAD (generalized anxiety disorder) 01/26/2015   OCD (obsessive compulsive disorder) 01/26/2015   Panic disorder without agoraphobia 01/26/2015   Insomnia  01/26/2015    Current Outpatient Medications:    acetaminophen (TYLENOL) 500 MG tablet, Take 2 tablets (1,000 mg total) by mouth 2 (two) times daily as needed., Disp: 180 tablet, Rfl: 3   albuterol (VENTOLIN HFA) 108 (90 Base) MCG/ACT inhaler, INHALE 2 PUFFS INTO THE LUNGS EVERY 6 (SIX) HOURS AS NEEDED FOR WHEEZING OR SHORTNESS OF BREATH., Disp: 18 g, Rfl: 3   apixaban (ELIQUIS) 5 MG TABS tablet, Take 1 tablet (5 mg total) by mouth 2 (two) times daily., Disp: 180 tablet, Rfl: 3   atorvastatin (LIPITOR) 40 MG tablet, Take 1 tablet (40 mg total) by mouth daily at 6 PM., Disp: 90 tablet, Rfl: 3   blood glucose meter kit and supplies KIT, Dispense based on patient and insurance preference. Use up to four times daily as directed. (FOR ICD-9 250.00, 250.01)., Disp: 1 each, Rfl: 0   carvedilol (COREG) 6.25 MG tablet, Take 1 tablet (6.25 mg total) by mouth 2 (two) times daily with a meal., Disp: 60 tablet, Rfl: 3   Continuous Blood Gluc Receiver (FREESTYLE LIBRE 14 DAY READER) DEVI, 1 each by Does not apply route as needed., Disp: 1 each, Rfl: 11   Continuous Blood Gluc Sensor (FREESTYLE LIBRE 14 DAY SENSOR) MISC, Use one applicator every 14 (fourteen) days., Disp: 6 each, Rfl: 3   cyclobenzaprine (FLEXERIL) 10 MG tablet, Take 10 mg by mouth 3 (three) times daily as needed for muscle spasms., Disp: , Rfl:    dapagliflozin propanediol (FARXIGA) 10 MG TABS tablet, Take 1 tablet (10 mg total) by mouth daily before breakfast., Disp: 30 tablet, Rfl: 11   ferrous  sulfate 325 (65 FE) MG tablet, Take 1 tablet (325 mg total) by mouth every other day., Disp: 30 tablet, Rfl: 0   furosemide (LASIX) 40 MG tablet, Take 1.5 tablets (60 mg total) by mouth 2 (two) times daily., Disp: 90 tablet, Rfl: 3   gabapentin (NEURONTIN) 800 MG tablet, Take 1 tablet (800 mg total) by mouth every 8 (eight) hours., Disp: 90 tablet, Rfl: 11   glipiZIDE (GLUCOTROL) 10 MG tablet, TAKE 1 TABLET (10 MG TOTAL) BY MOUTH 2 (TWO) TIMES DAILY  BEFORE A MEAL., Disp: 60 tablet, Rfl: 6   insulin glargine (LANTUS) 100 UNIT/ML injection, Inject 0.2 mLs (20 Units total) into the skin daily., Disp: 30 mL, Rfl: 3   Insulin Lispro Prot & Lispro (HUMALOG MIX 75/25 KWIKPEN) (75-25) 100 UNIT/ML Kwikpen, Inject 30 Units into the skin 2 (two) times daily., Disp: 15 mL, Rfl: 11   Insulin Pen Needle (TRUEPLUS PEN NEEDLES) 31G X 6 MM MISC, USE AS DIRECTED, Disp: 100 each, Rfl: 0   Insulin Syringe-Needle U-100 31G X 5/16" 0.3 ML MISC, USE 3 TIMES DAILY TO INJECT INSULIN, Disp: 300 each, Rfl: 3   metFORMIN (GLUCOPHAGE) 500 MG tablet, TAKE 1 TABLET (500 MG TOTAL) BY MOUTH 2 (TWO) TIMES DAILY WITH A MEAL., Disp: 60 tablet, Rfl: 6   Multiple Vitamins-Minerals (CENTRUM SILVER 50+MEN) TABS, Take 1 tablet by mouth daily., Disp: , Rfl:    pantoprazole (PROTONIX) 40 MG tablet, Take 1 tablet (40 mg total) by mouth daily., Disp: 90 tablet, Rfl: 3   potassium chloride SA (KLOR-CON M) 20 MEQ tablet, Take 2 tablets (40 mEq total) by mouth daily., Disp: 60 tablet, Rfl: 3   QUEtiapine (SEROQUEL) 300 MG tablet, Take 1 & 1/2 tablets by mouth daily., Disp: 45 tablet, Rfl: 0   sacubitril-valsartan (ENTRESTO) 24-26 MG, Take 1 tablet by mouth 2 (two) times daily., Disp: 180 tablet, Rfl: 3   spironolactone (ALDACTONE) 25 MG tablet, Take 0.5 tablets (12.5 mg total) by mouth at bedtime., Disp: 45 tablet, Rfl: 3   tetrahydrozoline 0.05 % ophthalmic solution, Place 1 drop into both eyes daily., Disp: , Rfl:    TRUEPLUS PEN NEEDLES 31G X 6 MM MISC, USE AS DIRECTED, Disp: 100 each, Rfl: 0 No Known Allergies   Social History   Socioeconomic History   Marital status: Legally Separated    Spouse name: Not on file   Number of children: Not on file   Years of education: Not on file   Highest education level: Not on file  Occupational History   Not on file  Tobacco Use   Smoking status: Former   Smokeless tobacco: Never  Vaping Use   Vaping Use: Never used  Substance and  Sexual Activity   Alcohol use: Not Currently    Alcohol/week: 2.0 standard drinks    Types: 2 Cans of beer per week   Drug use: No   Sexual activity: Yes  Other Topics Concern   Not on file  Social History Narrative   Not on file   Social Determinants of Health   Financial Resource Strain: Not on file  Food Insecurity: No Food Insecurity   Worried About Charity fundraiser in the Last Year: Never true   Ran Out of Food in the Last Year: Never true  Transportation Needs: Unmet Transportation Needs   Lack of Transportation (Medical): Yes   Lack of Transportation (Non-Medical): Yes  Physical Activity: Not on file  Stress: Not on file  Social Connections: Not  on file  Intimate Partner Violence: Not on file    Physical Exam Vitals reviewed.  Constitutional:      Appearance: Normal appearance. He is normal weight.  HENT:     Head: Normocephalic.     Nose: Nose normal.     Mouth/Throat:     Mouth: Mucous membranes are moist.     Pharynx: Oropharynx is clear.  Eyes:     Conjunctiva/sclera: Conjunctivae normal.     Pupils: Pupils are equal, round, and reactive to light.  Cardiovascular:     Rate and Rhythm: Normal rate and regular rhythm.     Pulses: Normal pulses.     Heart sounds: Normal heart sounds.  Pulmonary:     Effort: Pulmonary effort is normal.     Breath sounds: Normal breath sounds.  Abdominal:     General: Abdomen is flat.     Palpations: Abdomen is soft.  Musculoskeletal:        General: No swelling. Normal range of motion.     Cervical back: Normal range of motion.     Right lower leg: No edema.     Left lower leg: No edema.  Skin:    General: Skin is warm and dry.     Capillary Refill: Capillary refill takes less than 2 seconds.  Neurological:     General: No focal deficit present.     Mental Status: He is alert. Mental status is at baseline.  Psychiatric:        Mood and Affect: Mood normal.    Arrived for home visit for Mr. Veldhuizen who reports  feeling good this week. He states he is having no shortness of breath, dizziness, chest pain or trouble sleeping. Mr. Morrone has been compliant with his medications over the last week. He has been compliant with appointments this week also. He completed his COVID screening for his procedure planned for Thursday. Vitals obtained and are as noted. CBG- 139 No edema or swelling noted. Lung sounds clear. Mr. Nester does note to get short of breath while walking around the house, he reports this is normal and he just needs to lose weight. He states after his healing from his procedure he is going to start back going to the [facility].   Medications reviewed and confirmed. Pill box filled accordingly.   Home visit complete. I will see Mr. Trippett in one week.   Refills: Pen Needles Syringes Seroquel       Future Appointments  Date Time Provider [place]  12/02/2021  7:40 AM [facility] DEVICE REMOTES [facility]  12/12/2021 11:30 AM [facility] PA/NP [facility] None  03/03/2022  7:40 AM [facility] DEVICE REMOTES [facility]  03/30/2022  1:20 PM [name], NP [facility] None     ACTION: Home visit completed

## 2021-10-25 NOTE — Progress Notes (Signed)
Device orders requested and [employer] ([employer] rep) notified.

## 2021-10-26 ENCOUNTER — Encounter: Payer: Self-pay | Admitting: Cardiology

## 2021-10-26 ENCOUNTER — Other Ambulatory Visit: Payer: Self-pay

## 2021-10-26 MED ORDER — QUETIAPINE FUMARATE 300 MG PO TABS
ORAL_TABLET | ORAL | 0 refills | Status: DC
  Filled 2021-10-26: qty 45, 30d supply, fill #0

## 2021-10-26 NOTE — Progress Notes (Unsigned)
PERIOPERATIVE PRESCRIPTION FOR IMPLANTED CARDIAC DEVICE PROGRAMMING  Patient Information: Name:  [name]  DOB:  July 06, 1961  MRN:  [id number]  {TIP - You do not have to delete this tip  -  Copy the info from the staff message sent by the PAT staff  then press F2 here and paste the information using CTL - V on the next line :[id number]}  Planned Procedure:  left atrial appendage occlusion  Surgeon:  Dr. Lars Mage and Dr. Claudia Desanctis  Date of Procedure:  10/27/21  Device Information:  Clinic EP Physician:  Dr. Quentin Ore  Device Type:  Defibrillator Manufacturer and Phone #:  [employer]: [phone] Pacemaker Dependent?:  No. Date of Last Device Check:  08/31/2021 Normal Device Function?:  Yes.    Electrophysiologist's Recommendations:  Have magnet available. Provide continuous ECG monitoring when magnet is used or reprogramming is to be performed.  Procedure will likely interfere with device function.  Device should be programmed:  Tachy therapies disabled  Per Device Clinic Standing Orders, Wanda Plump, RN  8:31 AM 10/26/2021

## 2021-10-27 ENCOUNTER — Other Ambulatory Visit: Payer: Self-pay

## 2021-10-27 ENCOUNTER — Inpatient Hospital Stay (HOSPITAL_COMMUNITY): Payer: Medicare (Managed Care) | Admitting: Physician Assistant

## 2021-10-27 ENCOUNTER — Inpatient Hospital Stay (HOSPITAL_COMMUNITY)
Admission: RE | Admit: 2021-10-27 | Discharge: 2021-10-28 | DRG: 274 | Disposition: A | Discharge status: Home / Self Care | Payer: Medicare (Managed Care) | Attending: Cardiology | Admitting: Cardiology

## 2021-10-27 ENCOUNTER — Inpatient Hospital Stay (HOSPITAL_COMMUNITY)
Admission: RE | Admit: 2021-10-27 | Discharge: 2021-10-27 | Disposition: A | Discharge status: Home / Self Care | Payer: Medicare (Managed Care) | Source: Ambulatory Visit | Attending: Cardiology | Admitting: Cardiology

## 2021-10-27 ENCOUNTER — Inpatient Hospital Stay (HOSPITAL_COMMUNITY): Payer: Medicare (Managed Care)

## 2021-10-27 ENCOUNTER — Encounter (HOSPITAL_COMMUNITY)
Admission: RE | Disposition: A | Discharge status: Home / Self Care | Payer: Self-pay | Source: Home / Self Care | Attending: Cardiology

## 2021-10-27 ENCOUNTER — Encounter (HOSPITAL_COMMUNITY): Payer: Self-pay | Admitting: Cardiology

## 2021-10-27 DIAGNOSIS — Z8249 Family history of ischemic heart disease and other diseases of the circulatory system: Secondary | ICD-10-CM | POA: Diagnosis not present

## 2021-10-27 DIAGNOSIS — Z7984 Long term (current) use of oral hypoglycemic drugs: Secondary | ICD-10-CM | POA: Diagnosis not present

## 2021-10-27 DIAGNOSIS — Z955 Presence of coronary angioplasty implant and graft: Secondary | ICD-10-CM | POA: Diagnosis not present

## 2021-10-27 DIAGNOSIS — I2510 Atherosclerotic heart disease of native coronary artery without angina pectoris: Secondary | ICD-10-CM | POA: Diagnosis present

## 2021-10-27 DIAGNOSIS — Z20822 Contact with and (suspected) exposure to covid-19: Secondary | ICD-10-CM | POA: Diagnosis present

## 2021-10-27 DIAGNOSIS — Z006 Encounter for examination for normal comparison and control in clinical research program: Secondary | ICD-10-CM

## 2021-10-27 DIAGNOSIS — E119 Type 2 diabetes mellitus without complications: Secondary | ICD-10-CM | POA: Diagnosis present

## 2021-10-27 DIAGNOSIS — I440 Atrioventricular block, first degree: Secondary | ICD-10-CM | POA: Diagnosis not present

## 2021-10-27 DIAGNOSIS — K31819 Angiodysplasia of stomach and duodenum without bleeding: Secondary | ICD-10-CM

## 2021-10-27 DIAGNOSIS — I480 Paroxysmal atrial fibrillation: Secondary | ICD-10-CM | POA: Diagnosis present

## 2021-10-27 DIAGNOSIS — Z8719 Personal history of other diseases of the digestive system: Secondary | ICD-10-CM

## 2021-10-27 DIAGNOSIS — Z95810 Presence of automatic (implantable) cardiac defibrillator: Secondary | ICD-10-CM | POA: Diagnosis present

## 2021-10-27 DIAGNOSIS — I509 Heart failure, unspecified: Secondary | ICD-10-CM

## 2021-10-27 DIAGNOSIS — I110 Hypertensive heart disease with heart failure: Secondary | ICD-10-CM | POA: Diagnosis present

## 2021-10-27 DIAGNOSIS — I5022 Chronic systolic (congestive) heart failure: Secondary | ICD-10-CM | POA: Diagnosis present

## 2021-10-27 DIAGNOSIS — G4730 Sleep apnea, unspecified: Secondary | ICD-10-CM | POA: Diagnosis present

## 2021-10-27 DIAGNOSIS — N183 Chronic kidney disease, stage 3 unspecified: Secondary | ICD-10-CM | POA: Diagnosis present

## 2021-10-27 DIAGNOSIS — Z95818 Presence of other cardiac implants and grafts: Secondary | ICD-10-CM

## 2021-10-27 DIAGNOSIS — Z87891 Personal history of nicotine dependence: Secondary | ICD-10-CM | POA: Diagnosis not present

## 2021-10-27 DIAGNOSIS — G4733 Obstructive sleep apnea (adult) (pediatric): Secondary | ICD-10-CM

## 2021-10-27 DIAGNOSIS — E1165 Type 2 diabetes mellitus with hyperglycemia: Secondary | ICD-10-CM

## 2021-10-27 DIAGNOSIS — Z794 Long term (current) use of insulin: Secondary | ICD-10-CM | POA: Diagnosis not present

## 2021-10-27 DIAGNOSIS — Z01810 Encounter for preprocedural cardiovascular examination: Secondary | ICD-10-CM

## 2021-10-27 DIAGNOSIS — I4891 Unspecified atrial fibrillation: Secondary | ICD-10-CM | POA: Diagnosis present

## 2021-10-27 DIAGNOSIS — I5032 Chronic diastolic (congestive) heart failure: Secondary | ICD-10-CM | POA: Diagnosis present

## 2021-10-27 DIAGNOSIS — E1142 Type 2 diabetes mellitus with diabetic polyneuropathy: Secondary | ICD-10-CM | POA: Diagnosis present

## 2021-10-27 HISTORY — DX: Presence of other cardiac implants and grafts: Z95.818

## 2021-10-27 HISTORY — DX: Gastrointestinal hemorrhage, unspecified: K92.2

## 2021-10-27 HISTORY — PX: LEFT ATRIAL APPENDAGE OCCLUSION: EP1229

## 2021-10-27 HISTORY — PX: TEE WITHOUT CARDIOVERSION: SHX5443

## 2021-10-27 HISTORY — DX: Unspecified atrial fibrillation: I48.91

## 2021-10-27 HISTORY — DX: Cardiomyopathy, unspecified: I42.9

## 2021-10-27 LAB — TYPE AND SCREEN
ABO/RH(D): B POS
Antibody Screen: NEGATIVE

## 2021-10-27 LAB — GLUCOSE, CAPILLARY
Glucose-Capillary: 150 mg/dL — ABNORMAL HIGH (ref 70–99)
Glucose-Capillary: 136 mg/dL — ABNORMAL HIGH (ref 70–99)
Glucose-Capillary: 111 mg/dL — ABNORMAL HIGH (ref 70–99)
Glucose-Capillary: 266 mg/dL — ABNORMAL HIGH (ref 70–99)

## 2021-10-27 LAB — CBC
HCT: 44.1 % (ref 39.0–52.0)
Hemoglobin: 13.5 g/dL (ref 13.0–17.0)
MCH: 25.7 pg — ABNORMAL LOW (ref 26.0–34.0)
MCHC: 30.6 g/dL (ref 30.0–36.0)
MCV: 83.8 fL (ref 80.0–100.0)
Platelets: 232 10*3/uL (ref 150–400)
RBC: 5.26 MIL/uL (ref 4.22–5.81)
RDW: 17.4 % — ABNORMAL HIGH (ref 11.5–15.5)
WBC: 7.8 10*3/uL (ref 4.0–10.5)
nRBC: 0 % (ref 0.0–0.2)

## 2021-10-27 LAB — POCT ACTIVATED CLOTTING TIME: Activated Clotting Time: 300 seconds

## 2021-10-27 LAB — SURGICAL PCR SCREEN
MRSA, PCR: NEGATIVE
Staphylococcus aureus: NEGATIVE

## 2021-10-27 SURGERY — LEFT ATRIAL APPENDAGE OCCLUSION
Anesthesia: General

## 2021-10-27 MED ORDER — APIXABAN 5 MG PO TABS
5.0000 mg | ORAL_TABLET | Freq: Two times a day (BID) | ORAL | Status: DC
  Administered 2021-10-27 – 2021-10-28 (×2): 5 mg via ORAL
  Filled 2021-10-27 (×2): qty 1

## 2021-10-27 MED ORDER — FUROSEMIDE 40 MG PO TABS
60.0000 mg | ORAL_TABLET | Freq: Two times a day (BID) | ORAL | Status: DC
  Administered 2021-10-27 – 2021-10-28 (×2): 60 mg via ORAL
  Filled 2021-10-27 (×2): qty 1

## 2021-10-27 MED ORDER — PANTOPRAZOLE SODIUM 40 MG PO TBEC
40.0000 mg | DELAYED_RELEASE_TABLET | Freq: Every day | ORAL | Status: DC
  Administered 2021-10-28: 40 mg via ORAL
  Filled 2021-10-27: qty 1

## 2021-10-27 MED ORDER — ROCURONIUM BROMIDE 10 MG/ML (PF) SYRINGE
PREFILLED_SYRINGE | INTRAVENOUS | Status: DC | PRN
  Administered 2021-10-27: 30 mg via INTRAVENOUS
  Administered 2021-10-27: 60 mg via INTRAVENOUS

## 2021-10-27 MED ORDER — SODIUM CHLORIDE 0.9 % IV SOLN: INTRAVENOUS | Status: DC

## 2021-10-27 MED ORDER — DEXAMETHASONE SODIUM PHOSPHATE 10 MG/ML IJ SOLN
INTRAMUSCULAR | Status: DC | PRN
  Administered 2021-10-27: 5 mg via INTRAVENOUS

## 2021-10-27 MED ORDER — SODIUM CHLORIDE 0.9 % IV SOLN: 250.0000 mL | INTRAVENOUS | Status: DC | PRN

## 2021-10-27 MED ORDER — HEPARIN SODIUM (PORCINE) 1000 UNIT/ML IJ SOLN
INTRAMUSCULAR | Status: DC | PRN
  Administered 2021-10-27: 20000 [IU] via INTRAVENOUS

## 2021-10-27 MED ORDER — PHENYLEPHRINE 40 MCG/ML (10ML) SYRINGE FOR IV PUSH (FOR BLOOD PRESSURE SUPPORT)
PREFILLED_SYRINGE | INTRAVENOUS | Status: DC | PRN
  Administered 2021-10-27: 25 ug via INTRAVENOUS
  Administered 2021-10-27 (×2): 120 ug via INTRAVENOUS

## 2021-10-27 MED ORDER — ONDANSETRON HCL 4 MG/2ML IJ SOLN
INTRAMUSCULAR | Status: DC | PRN
  Administered 2021-10-27: 4 mg via INTRAVENOUS

## 2021-10-27 MED ORDER — SPIRONOLACTONE 12.5 MG HALF TABLET
12.5000 mg | ORAL_TABLET | Freq: Every day | ORAL | Status: DC
  Administered 2021-10-27: 12.5 mg via ORAL
  Filled 2021-10-27: qty 1

## 2021-10-27 MED ORDER — SODIUM CHLORIDE 0.9% FLUSH: 3.0000 mL | INTRAVENOUS | Status: DC | PRN

## 2021-10-27 MED ORDER — LIDOCAINE 2% (20 MG/ML) 5 ML SYRINGE
INTRAMUSCULAR | Status: DC | PRN
  Administered 2021-10-27: 100 mg via INTRAVENOUS

## 2021-10-27 MED ORDER — PHENYLEPHRINE HCL-NACL 20-0.9 MG/250ML-% IV SOLN
INTRAVENOUS | Status: DC | PRN
  Administered 2021-10-27: 50 ug/min via INTRAVENOUS

## 2021-10-27 MED ORDER — GABAPENTIN 400 MG PO CAPS
800.0000 mg | ORAL_CAPSULE | Freq: Three times a day (TID) | ORAL | Status: DC
  Administered 2021-10-27 – 2021-10-28 (×2): 800 mg via ORAL
  Filled 2021-10-27 (×2): qty 2

## 2021-10-27 MED ORDER — ONDANSETRON HCL 4 MG/2ML IJ SOLN: 4.0000 mg | Freq: Four times a day (QID) | INTRAMUSCULAR | Status: DC | PRN

## 2021-10-27 MED ORDER — DAPAGLIFLOZIN PROPANEDIOL 10 MG PO TABS
10.0000 mg | ORAL_TABLET | Freq: Every day | ORAL | Status: DC
  Administered 2021-10-28: 10 mg via ORAL
  Filled 2021-10-27: qty 1

## 2021-10-27 MED ORDER — CARVEDILOL 6.25 MG PO TABS
6.2500 mg | ORAL_TABLET | Freq: Two times a day (BID) | ORAL | Status: DC
  Administered 2021-10-28: 6.25 mg via ORAL
  Filled 2021-10-27: qty 1

## 2021-10-27 MED ORDER — CEFAZOLIN IN SODIUM CHLORIDE 3-0.9 GM/100ML-% IV SOLN
3.0000 g | INTRAVENOUS | Status: AC
  Administered 2021-10-27: 3 g via INTRAVENOUS
  Filled 2021-10-27: qty 100

## 2021-10-27 MED ORDER — INSULIN ASPART 100 UNIT/ML IJ SOLN
0.0000 [IU] | Freq: Three times a day (TID) | INTRAMUSCULAR | Status: DC
Start: 2021-10-27 — End: 2021-10-28
  Administered 2021-10-28 (×2): 8 [IU] via SUBCUTANEOUS

## 2021-10-27 MED ORDER — SUCCINYLCHOLINE CHLORIDE 200 MG/10ML IV SOSY
PREFILLED_SYRINGE | INTRAVENOUS | Status: DC | PRN
  Administered 2021-10-27: 160 mg via INTRAVENOUS

## 2021-10-27 MED ORDER — GABAPENTIN 800 MG PO TABS
800.0000 mg | ORAL_TABLET | Freq: Three times a day (TID) | ORAL | Status: DC
  Filled 2021-10-27 (×3): qty 1

## 2021-10-27 MED ORDER — SUGAMMADEX SODIUM 200 MG/2ML IV SOLN
INTRAVENOUS | Status: DC | PRN
  Administered 2021-10-27: 400 mg via INTRAVENOUS

## 2021-10-27 MED ORDER — PROTAMINE SULFATE 10 MG/ML IV SOLN
INTRAVENOUS | Status: DC | PRN
  Administered 2021-10-27: 35 mg via INTRAVENOUS

## 2021-10-27 MED ORDER — MIDAZOLAM HCL 2 MG/2ML IJ SOLN
INTRAMUSCULAR | Status: DC | PRN
  Administered 2021-10-27: 2 mg via INTRAVENOUS

## 2021-10-27 MED ORDER — EPHEDRINE SULFATE-NACL 50-0.9 MG/10ML-% IV SOSY
PREFILLED_SYRINGE | INTRAVENOUS | Status: DC | PRN
  Administered 2021-10-27: 10 mg via INTRAVENOUS

## 2021-10-27 MED ORDER — INSULIN ASPART 100 UNIT/ML IJ SOLN: 0.0000 [IU] | Freq: Every day | INTRAMUSCULAR | Status: DC

## 2021-10-27 MED ORDER — CHLORHEXIDINE GLUCONATE 0.12 % MT SOLN
OROMUCOSAL | Status: AC
  Administered 2021-10-27: 15 mL
  Filled 2021-10-27: qty 15

## 2021-10-27 MED ORDER — PROPOFOL 10 MG/ML IV BOLUS
INTRAVENOUS | Status: DC | PRN
  Administered 2021-10-27: 160 mg via INTRAVENOUS

## 2021-10-27 MED ORDER — ATORVASTATIN CALCIUM 40 MG PO TABS: 40.0000 mg | ORAL_TABLET | Freq: Every day | ORAL | Status: DC

## 2021-10-27 MED ORDER — HEPARIN (PORCINE) IN NACL 1000-0.9 UT/500ML-% IV SOLN
INTRAVENOUS | Status: DC | PRN
  Administered 2021-10-27: 500 mL

## 2021-10-27 MED ORDER — ACETAMINOPHEN 325 MG PO TABS
650.0000 mg | ORAL_TABLET | ORAL | Status: DC | PRN
  Administered 2021-10-27: 650 mg via ORAL
  Filled 2021-10-27: qty 2

## 2021-10-27 MED ORDER — LACTATED RINGERS IV SOLN: INTRAVENOUS | Status: DC

## 2021-10-27 MED ORDER — SACUBITRIL-VALSARTAN 24-26 MG PO TABS
1.0000 | ORAL_TABLET | Freq: Two times a day (BID) | ORAL | Status: DC
  Administered 2021-10-27 – 2021-10-28 (×2): 1 via ORAL
  Filled 2021-10-27 (×2): qty 1

## 2021-10-27 MED ORDER — IOHEXOL 350 MG/ML SOLN
INTRAVENOUS | Status: DC | PRN
  Administered 2021-10-27: 15 mL via INTRA_ARTERIAL

## 2021-10-27 MED ORDER — FENTANYL CITRATE (PF) 100 MCG/2ML IJ SOLN
INTRAMUSCULAR | Status: DC | PRN
  Administered 2021-10-27: 50 ug via INTRAVENOUS

## 2021-10-27 MED ORDER — SODIUM CHLORIDE 0.9% FLUSH
3.0000 mL | Freq: Two times a day (BID) | INTRAVENOUS | Status: DC
  Administered 2021-10-27 – 2021-10-28 (×2): 3 mL via INTRAVENOUS

## 2021-10-27 MED ORDER — HEPARIN (PORCINE) IN NACL 2000-0.9 UNIT/L-% IV SOLN
INTRAVENOUS | Status: DC | PRN
  Administered 2021-10-27: 1000 mL

## 2021-10-27 SURGICAL SUPPLY — 23 items
BLANKET WARM UNDERBOD FULL ACC (MISCELLANEOUS) IMPLANT
CATH DIAG 6FR PIGTAIL ANGLED (CATHETERS) ×2 IMPLANT
CLOSURE PERCLOSE PROSTYLE (VASCULAR PRODUCTS) ×4 IMPLANT
DILATOR VESSEL 38 20CM 16FR (INTRODUCER) ×2 IMPLANT
FEM STOP ARCH (HEMOSTASIS) ×2
GUIDEWIRE INQWIRE 1.5J.035X260 (WIRE) IMPLANT
INQWIRE 1.5J .035X260CM (WIRE)
KIT HEART LEFT (KITS) ×2 IMPLANT
KIT SHEA VERSACROSS LAAC CONNE (KITS) ×2 IMPLANT
PACK CARDIAC CATHETERIZATION (CUSTOM PROCEDURE TRAY) ×2 IMPLANT
PAD DEFIB RADIO PHYSIO CONN (PAD) ×2 IMPLANT
SHEATH PERFORMER 16FR 30 (SHEATH) ×2 IMPLANT
SHEATH PINNACLE 8F 10CM (SHEATH) ×2 IMPLANT
SHEATH PROBE COVER 6X72 (BAG) ×2 IMPLANT
SHIELD RADPAD SCOOP 12X17 (MISCELLANEOUS) IMPLANT
SYS WATCHMAN FXD DBL (SHEATH) ×2
SYSTEM COMPRESSION FEMOSTOP (HEMOSTASIS) ×1 IMPLANT
SYSTEM WATCHMAN FXD DBL (SHEATH) ×1 IMPLANT
TRANSDUCER W/STOPCOCK (MISCELLANEOUS) ×2 IMPLANT
TUBING CIL FLEX 10 FLL-RA (TUBING) ×2 IMPLANT
WATCHMAN FLX 27 (Prosthesis & Implant Heart) ×2 IMPLANT
WATCHMAN FLX PROCEDURE DEVICE (KITS) ×2 IMPLANT
WATCHMAN PROCED TRUSEAL ACCESS (SHEATH) ×2 IMPLANT

## 2021-10-27 NOTE — Anesthesia Postprocedure Evaluation (Signed)
Anesthesia Post Note  Patient: [name]  Procedure(s) Performed: LEFT ATRIAL APPENDAGE OCCLUSION TRANSESOPHAGEAL ECHOCARDIOGRAM (TEE)     Patient location during evaluation: Cath Lab Anesthesia Type: General Level of consciousness: awake Pain management: pain level controlled Vital Signs Assessment: post-procedure vital signs reviewed and stable Respiratory status: spontaneous breathing Cardiovascular status: stable Postop Assessment: no apparent nausea or vomiting Anesthetic complications: no   There were no known notable events for this encounter.  Last Vitals:  Vitals:   10/27/21 1725 10/27/21 1728  BP: (!) 152/89 (!) 141/87  Pulse: 84 75  Resp: 16 15  Temp:  36.9 C  SpO2: 93% 94%    Last Pain:  Vitals:   10/27/21 1728  TempSrc: Oral  PainSc:                  Tyrion Glaude

## 2021-10-27 NOTE — Transfer of Care (Signed)
Immediate Anesthesia Transfer of Care Note  Patient: [name]  Procedure(s) Performed: LEFT ATRIAL APPENDAGE OCCLUSION TRANSESOPHAGEAL ECHOCARDIOGRAM (TEE)  Patient Location: Cath Lab  Anesthesia Type:General  Level of Consciousness: awake, alert  and oriented  Airway & Oxygen Therapy: Patient Spontanous Breathing and Patient connected to nasal cannula oxygen  Post-op Assessment: Report given to RN  Post vital signs: Reviewed and stable  Last Vitals:  Vitals Value Taken Time  BP 146/77 10/27/21 1551  Temp 36.2 C 10/27/21 1550  Pulse 71 10/27/21 1554  Resp 16 10/27/21 1554  SpO2 92 % 10/27/21 1554  Vitals shown include unvalidated device data.  Last Pain:  Vitals:   10/27/21 1550  TempSrc: Temporal  PainSc: Asleep         Complications: There were no known notable events for this encounter.

## 2021-10-27 NOTE — Progress Notes (Signed)
Echocardiogram Echocardiogram Transesophageal has been performed.   Oneal Deputy Eberardo Demello RDCS 10/27/2021, 3:54 PM

## 2021-10-27 NOTE — Progress Notes (Signed)
  [facility]    Patient doing well s/p Watchman implant. He is hemodynamically stable. Groin site is stable. Pt remains sleepy from anesthesia however responsive and talking. Plan for early ambulation after bedrest completed and hopeful discharge over the next 24 hours.   Kathyrn Drown NP-C Structural Heart Team  Pager: [phone]

## 2021-10-27 NOTE — Op Note (Signed)
  [place] TEAM  Watchman [place] Procedure   Surgeon: Lars Mage, MD Co-Surgeon: Sherren Mocha, MD Anesthesia: Belinda Block, MD Imager: Marry Guan, MD   Procedure: 1) Transseptal Puncture 2) Watchman left atrial appendage occlusion 3) Perclose right femoral vein   Device Implant: 27 mm Watchman Flex Device   Background and Indication: 60 yo with paroxysmal atrial fibrillation, prior GI bleeding, felt to be a poor candidate for long term anticoagulation. He is referred for the above procedure   Procedure description: US guidance is used for right femoral venous access. [place] images are stored digitally in the patient's chart. Double Preclose is performed at 10" and 2" positions using normal technique and a after progressively dilating the femoral vein over an Amplatz Superstiff wire, a 16 Fr sheath is inserted. Transseptal puncture is then performed after fully anticoagulating the patient with unfractionated heparin. A therapeutic ACT greater than 250 seconds is achieved. A Versacross Connect system is used with RF energy to cross the interatrial septum under fluoroscopic and TEE guidance. Please see Dr Mardene Speak detailed report for further description of transseptal puncture and Watchman Access Sheath insertion.   Once the 14 Fr access sheath is in appropriate position in the left atrial appendage, a 27 mm Watchman Flex device is inserted and deployed, demonstrating appropriate position and compression in the left atrial appendage. No device recaptures are required. Thorough TEE imaging is performed to evaluate all PASS criteria prior to device release. After device release, the device remains in stable position with complete occlusion of the appendage. The sheath is removed from the body and the previously deployed perclose sutures are tightened for complete hemostasis.   Conclusion: Successful implantation of a 27 mm Watchman FLX left atrial  appendage occlusion device using fluoroscopic and transesophageal echo guidance  Sherren Mocha 10/27/2021 3:36 PM

## 2021-10-27 NOTE — Progress Notes (Addendum)
   Called by RN, groin site is bleeding, site was perclosed at end of procedure. Cath lab Aaron Edelman is present and will add Fem stop at low pressure. Will leave for 2 hours then remove.  Will check CBC in an hour.   Cecilie Kicks, FNP-C [facility] [place]  [phone] or after 5pm and on weekends call [phone] 10/27/2021.

## 2021-10-27 NOTE — Progress Notes (Signed)
Called for Watchman site bleeding. Site bleeding, dressing soaked. After discussion with Cecilie Kicks, a femostop was applied at 58mm/hg. Site redressed with tegaderm. Site level 0, no hematoma evident. Right dp and pt pulse present with doppler, after femostop application.  Femo-stop applied at 19:00:00

## 2021-10-27 NOTE — Progress Notes (Signed)
Groin site assessment, groin bleeding, pressure held, Cecilie Kicks paged, Brain from cath lab to bedside and applied femo-stop.

## 2021-10-27 NOTE — Anesthesia Procedure Notes (Signed)
Procedure Name: Intubation Date/Time: 10/27/2021 2:44 PM Performed by: Barrington Ellison, CRNA Pre-anesthesia Checklist: Patient identified, Emergency Drugs available, Suction available and Patient being monitored Patient Re-evaluated:Patient Re-evaluated prior to induction Oxygen Delivery Method: Circle System Utilized Preoxygenation: Pre-oxygenation with 100% oxygen Induction Type: IV induction Ventilation: Mask ventilation without difficulty Laryngoscope Size: Glidescope and 4 Grade View: Grade I Tube type: Oral Tube size: 7.5 mm Number of attempts: 1 Airway Equipment and Method: Stylet and Oral airway Placement Confirmation: ETT inserted through vocal cords under direct vision, positive ETCO2 and breath sounds checked- equal and bilateral Secured at: 23 cm Tube secured with: Tape Dental Injury: Teeth and Oropharynx as per pre-operative assessment  Difficulty Due To: Difficulty was anticipated and Difficult Airway- due to large tongue
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
## 2021-10-27 NOTE — Anesthesia Preprocedure Evaluation (Addendum)
Anesthesia Evaluation  Patient identified by MRN, date of birth, ID band Patient awake    Reviewed: Allergy & Precautions, NPO status , Patient's Chart, lab work & pertinent test results  Airway Mallampati: II       Dental   Pulmonary sleep apnea , former smoker,    breath sounds clear to auscultation       Cardiovascular hypertension, + CAD and +CHF   Rhythm:Regular Rate:Normal     Neuro/Psych PSYCHIATRIC DISORDERS  Neuromuscular disease    GI/Hepatic negative GI ROS, Neg liver ROS,   Endo/Other  diabetes  Renal/GU Renal disease     Musculoskeletal   Abdominal   Peds  Hematology   Anesthesia Other Findings   Reproductive/Obstetrics                             Anesthesia Physical Anesthesia Plan  ASA: 3  Anesthesia Plan: General   Post-op Pain Management:    Induction: Intravenous  PONV Risk Score and Plan: 2 and Ondansetron and Treatment may vary due to age or medical condition  Airway Management Planned: Oral ETT  Additional Equipment:   Intra-op Plan:   Post-operative Plan: Extubation in OR  Informed Consent: I have reviewed the patients History and Physical, chart, labs and discussed the procedure including the risks, benefits and alternatives for the proposed anesthesia with the patient or authorized representative who has indicated his/her understanding and acceptance.     Dental advisory given  Plan Discussed with: CRNA  Anesthesia Plan Comments:         Anesthesia Quick Evaluation

## 2021-10-28 ENCOUNTER — Encounter (HOSPITAL_COMMUNITY): Payer: Self-pay | Admitting: Cardiology

## 2021-10-28 ENCOUNTER — Other Ambulatory Visit: Payer: Self-pay

## 2021-10-28 DIAGNOSIS — Z95818 Presence of other cardiac implants and grafts: Secondary | ICD-10-CM

## 2021-10-28 LAB — BASIC METABOLIC PANEL
Anion gap: 8 (ref 5–15)
BUN: 21 mg/dL (ref 8–23)
CO2: 24 mmol/L (ref 22–32)
Calcium: 8.5 mg/dL — ABNORMAL LOW (ref 8.9–10.3)
Chloride: 103 mmol/L (ref 98–111)
Creatinine, Ser: 1.01 mg/dL (ref 0.61–1.24)
GFR, Estimated: >60 mL/min (ref >60)
Glucose, Bld: 258 mg/dL — ABNORMAL HIGH (ref 70–99)
Potassium: 4.6 mmol/L (ref 3.5–5.1)
Sodium: 135 mmol/L (ref 135–145)

## 2021-10-28 LAB — CBC
HCT: 42.4 % (ref 39.0–52.0)
Hemoglobin: 13 g/dL (ref 13.0–17.0)
MCH: 25.5 pg — ABNORMAL LOW (ref 26.0–34.0)
MCHC: 30.7 g/dL (ref 30.0–36.0)
MCV: 83.1 fL (ref 80.0–100.0)
Platelets: 229 10*3/uL (ref 150–400)
RBC: 5.1 MIL/uL (ref 4.22–5.81)
RDW: 17.3 % — ABNORMAL HIGH (ref 11.5–15.5)
WBC: 6.8 10*3/uL (ref 4.0–10.5)
nRBC: 0 % (ref 0.0–0.2)

## 2021-10-28 LAB — GLUCOSE, CAPILLARY
Glucose-Capillary: 204 mg/dL — ABNORMAL HIGH (ref 70–99)
Glucose-Capillary: 263 mg/dL — ABNORMAL HIGH (ref 70–99)
Glucose-Capillary: 242 mg/dL — ABNORMAL HIGH (ref 70–99)

## 2021-10-28 MED ORDER — AMOXICILLIN 500 MG PO CAPS
2000.0000 mg | ORAL_CAPSULE | ORAL | 12 refills | Status: DC
  Filled 2021-10-28: qty 12, 1d supply, fill #0

## 2021-10-28 NOTE — TOC Transition Note (Addendum)
Transition of Care [facility]) - CM/SW Discharge Note   Patient Details  Name: BILAL MANZER MRN: [id number] Date of Birth: 07/24/1961  Transition of Care [facility]) CM/SW Contact:  Zenon Mayo, RN Phone Number: 10/28/2021, 12:19 PM   Clinical Narrative:    Patient states he has transportation home, he lives in the apartment alone. He states [name] with para medicine comes out to check on him every week. He goes to the Patient Care clinic , will need follow up apt. Apt made, NCM sent information to patient of time of his apt and Patient care center will also call to remind him.     Final next level of care: Home/Self Care Barriers to Discharge: No Barriers Identified   Patient Goals and CMS Choice Patient states their goals for this hospitalization and ongoing recovery are:: return home   Choice offered to / list presented to : NA  Discharge Placement                       Discharge Plan and Services In-house Referral: NA Discharge Planning Services: CM Consult Post Acute Care Choice: NA            DME Agency: NA       HH Arranged: NA          Social Determinants of Health (SDOH) Interventions     Readmission Risk Interventions Readmission Risk Prevention Plan 10/28/2021  Transportation Screening Complete  PCP or Specialist Appt within 3-5 Days Complete  HRI or [place] Complete  Social Work Consult for [place] Planning/Counseling Complete  Palliative Care Screening Not Applicable  Medication Review Press photographer) Complete  Some recent data might be hidden

## 2021-10-28 NOTE — Progress Notes (Signed)
Inpatient Diabetes Program Recommendations  AACE/ADA: New Consensus Statement on Inpatient Glycemic Control (2015)  Target Ranges:  Prepandial:   less than 140 mg/dL      Peak postprandial:   less than 180 mg/dL (1-2 hours)      Critically ill patients:  140 - 180 mg/dL   Lab Results  Component Value Date   GLUCAP 263 (H) 10/28/2021   HGBA1C 8.4 (A) 09/30/2021   HGBA1C 8.4 09/30/2021   HGBA1C 8.4 (A) 09/30/2021   HGBA1C 8.4 (A) 09/30/2021    Review of Glycemic Control  Latest Reference Range & Units 10/27/21 21:34 10/28/21 06:23 10/28/21 08:23  Glucose-Capillary 70 - 99 mg/dL 266 (H) 204 (H) 263 (H)  (H): Data is abnormally high Diabetes history: Type 2 DM Outpatient Diabetes medications: Glipizide 10 mg BID, Lantus 20 units QD, Humalog 75/25 30 units BID, Metformin 500 mg BID Current orders for Inpatient glycemic control: Novolog 0-15 units TID, Novolog 0-5 units QHS, Farxiga 10 mg QD Decadron 10 mg x 1 Inpatient Diabetes Program Recommendations:    Consider adding Semglee 12 units QD.   Thanks, Bronson Curb, MSN, RNC-OB Diabetes Coordinator [phone] (8a-5p)

## 2021-10-28 NOTE — TOC Initial Note (Signed)
Transition of Care [facility]) - Initial/Assessment Note    Patient Details  Name: [name] MRN: [id number] Date of Birth: 11/14/1961  Transition of Care [facility]) CM/SW Contact:    Zenon Mayo, RN Phone Number: 10/28/2021, 12:17 PM  Clinical Narrative:                 Patient states he has transportation home, he lives in the apartment alone. He states [name] with para medicine comes out to check on him every week. He goes to the [facility] , will need follow up apt.   Expected Discharge Plan: Home/Self Care Barriers to Discharge: No Barriers Identified   Patient Goals and CMS Choice Patient states their goals for this hospitalization and ongoing recovery are:: return home   Choice offered to / list presented to : NA  Expected Discharge Plan and Services Expected Discharge Plan: Home/Self Care In-house Referral: NA Discharge Planning Services: CM Consult Post Acute Care Choice: NA Living arrangements for the past 2 months: Apartment Expected Discharge Date: 10/28/21                 DME Agency: NA       HH Arranged: NA          Prior Living Arrangements/Services Living arrangements for the past 2 months: Apartment Lives with:: Self Patient language and need for interpreter reviewed:: Yes Do you feel safe going back to the place where you live?: Yes      Need for Family Participation in Patient Care: No (Comment) Care giver support system in place?: No (comment)   Criminal Activity/Legal Involvement Pertinent to Current Situation/Hospitalization: No - Comment as needed  Activities of Daily Living Home Assistive Devices/Equipment: Blood pressure cuff, CBG Meter, Eyeglasses ADL Screening (condition at time of admission) Patient's cognitive ability adequate to safely complete daily activities?: Yes Is the patient deaf or have difficulty hearing?: No Does the patient have difficulty seeing, even when wearing glasses/contacts?: No Does the patient have  difficulty concentrating, remembering, or making decisions?: No Patient able to express need for assistance with ADLs?: Yes Does the patient have difficulty dressing or bathing?: No Independently performs ADLs?: Yes (appropriate for developmental age) Does the patient have difficulty walking or climbing stairs?: No Weakness of Legs: None Weakness of Arms/Hands: None  Permission Sought/Granted                  Emotional Assessment   Attitude/Demeanor/Rapport: Engaged Affect (typically observed): Appropriate Orientation: : Oriented to  Time, Oriented to Situation, Oriented to Place, Oriented to Self Alcohol / Substance Use: Not Applicable Psych Involvement: No (comment)  Admission diagnosis:  Atrial fibrillation ([place]) [I48.91] Patient Active Problem List   Diagnosis Date Noted   Presence of Watchman left atrial appendage closure device 10/28/2021   Atrial fibrillation ([place]) 10/27/2021   Melena    Chronic diastolic CHF (congestive heart failure) ([place])    GI bleed 06/29/2021   Severe anemia 06/29/2021   Adenomatous polyp of ascending colon    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   Acute blood loss anemia    Angiodysplasia of stomach    Chronic anticoagulation    CHF exacerbation ([place]) 06/15/2020   AKI (acute kidney injury) ([place]) 06/15/2020   CKD (chronic kidney disease), stage III ([place]) 06/15/2020   Anemia due to chronic blood loss    Gastric AVM    Angiodysplasia of duodenum with hemorrhage    Acute on chronic systolic (congestive) heart failure ([place]) 04/05/2020  Anemia 04/05/2020   PAF (paroxysmal atrial fibrillation) ([place]) 04/05/2020   OSA on CPAP 04/05/2020   Syncope and collapse 04/05/2020   Type 2 diabetes mellitus with hyperglycemia, with long-term current use of insulin ([place]) 12/03/2019   Diabetic polyneuropathy associated with type 2 diabetes mellitus ([place]) 12/03/2019   Erectile dysfunction 12/03/2019   Paranoid schizophrenia,  chronic condition ([place]) 01/26/2015   Severe recurrent major depressive disorder with psychotic features ([place]) 01/26/2015   GAD (generalized anxiety disorder) 01/26/2015   OCD (obsessive compulsive disorder) 01/26/2015   Panic disorder without agoraphobia 01/26/2015   Insomnia 01/26/2015   PCP:  [name], NP Pharmacy:   [facility] [place] [address] [place] [address] Phone: [phone] Fax: [phone]  [place], [address] [place] [address] Phone: [phone] Fax: [phone]     Social Determinants of Health (SDOH) Interventions    Readmission Risk Interventions Readmission Risk Prevention Plan 10/28/2021  Transportation Screening Complete  PCP or Specialist Appt within 3-5 Days Complete  HRI or [place] Complete  Social Work Consult for [place] Planning/Counseling Complete  Palliative Care Screening Not Applicable  Medication Review Press photographer) Complete  Some recent data might be hidden

## 2021-10-28 NOTE — Discharge Summary (Signed)
[facility]    Patient ID: [name],  MRN: [id number], DOB/AGE: 04-08-1961 60 y.o.  Admit date: 10/27/2021 Discharge date: 10/28/2021  Primary Care Physician: [name], NP  Primary Cardiologist: [name], MD  Electrophysiologist: [name], MD  Primary Discharge Diagnosis:  Paroxysmal Atrial Fibrillation Poor candidacy for long term anticoagulation due to h/o GI bleeding  Secondary Discharge Diagnosis:  Chronic systolic CHF with an initial LVEF at 25-30% with diffuse hypokinesis, CAD s/p OM2 PCI 2012, DM2, and schizophrenia.  Procedures This Admission:  Transeptal Puncture Intra-procedural TEE which showed no LAA thrombus Left atrial appendage occlusive device placement on 10/27/21 by Dr. Quentin Ore.   This study demonstrated: Conclusion: Successful implantation of a 27 mm Watchman FLX left atrial appendage occlusion device using fluoroscopic and transesophageal echo guidance   Brief HPI: [name] is a 60 y.o. male with a history of chronic systolic CHF with an initial LVEF at 25-30% with diffuse hypokinesis, CAD s/p OM2 PCI 2012, DM2, paroxysmal atrial fibrillation intolerant to anticoagulation due to GI bleeding, and schizophrenia. He is followed extensively by the [facility] due to severe systolic CHF as above. Echo 09/2019 showed EF 25-30% with diffuse hypokinesis. LHC performed at that time which showed occluded OM2 at prior stent, 90% D1 stenosis, and extensive diffuse RCA disease with no intervention performed. He was thought to be in paroxysmal atrial fibrillation and Eliquis was started. Repeat echo 5/21 showed EF 30% with diffuse hypokinesis, mild LVH, PASP 38, mildly decreased RV systolic function, IVC dilated. Biotronik ICD placed.    He was hospitalized in 06/16/21 with upper GI bleeding and CHF exacerbation. EGD showed duodenal AVMs, treated with APC. He was diuresed for CHF. Eliquis was held but  restarted at d/c.     Given his GI bleeding, he was referred to Dr. Quentin Ore to discuss LAAO implant. He was seen 09/21/21 at which time he was felt to be a good candidate for Watchman implant. He underwent CT scans 09/29/21 which showed left atrial appendage which was noted to be a large chicken wing type without thrombus. A 31 mm watchman FLX device was recommended with RAO 15 CRA 7 deployment and was ultimately scheduled for LAAO closure with Watchman on 10/27/21.   Hospital Course:  The patient was admitted and underwent left atrial appendage occlusive device placement with 70m FLX Watchman per Dr. LQuentin Oreon 10/27/21. 61 60 He was monitored on telemetry overnight which demonstrated NSR with 1st degree AV block. He had an issue with groin bleeding after the procedure at which time a Femstop was placed and monitored by cath lab staff. Groin looks great this AM at a level 0. The patient was examined and considered to be stable for discharge. Wound care and restrictions were reviewed with the patient. The patient has been scheduled for post procedure follow up with JKathyrn Drown NP in 1 month. Medication plan will be Eliquis 522mBID monotherapy. A repeat TEE at approximately 45 days will be performed to ensure proper seal of the device.   The patient ambulated without groin complication prior to discharge. SBE for 6 months discussed and will be prescribed Amox 2g one hour prior to dental cleanings/procedures.   Physical Exam: Vitals:   10/27/21 2300 10/28/21 0323 10/28/21 0752 10/28/21 1057  BP: 137/77 (!) 150/87 (!) 152/85 (!) 141/86  Pulse: 75 72 73 67  Resp: '15 17 14 15  ' Temp: 98.3 F (36.8 C) 98.5 F (36.9 C)  97.8 F (36.6 C) 97.8 F (36.6 C)  TempSrc: Oral Oral    SpO2: 92% 98% 96%   Weight:      Height:       General: Well developed, well nourished, NAD Lungs:Clear to ausculation bilaterally. No wheezes, rales, or rhonchi. Breathing is unlabored. Cardiovascular: RRR with S1 S2. No  murmurs Extremities: No edema.  Neuro: Alert and oriented. No focal deficits. No facial asymmetry. MAE spontaneously. Psych: Responds to questions appropriately with normal affect.    Labs:   Lab Results  Component Value Date   WBC 6.8 10/28/2021   HGB 13.0 10/28/2021   HCT 42.4 10/28/2021   MCV 83.1 10/28/2021   PLT 229 10/28/2021    Recent Labs  Lab 10/28/21 0139  NA 135  K 4.6  CL 103  CO2 24  BUN 21  CREATININE 1.01  CALCIUM 8.5*  GLUCOSE 258*    Discharge Medications:  Allergies as of 10/28/2021   No Known Allergies      Medication List     TAKE these medications    acetaminophen 500 MG tablet Commonly known as: TYLENOL Take 2 tablets (1,000 mg total) by mouth 2 (two) times daily as needed. What changed: when to take this   albuterol 108 (90 Base) MCG/ACT inhaler Commonly known as: VENTOLIN HFA INHALE 2 PUFFS INTO THE LUNGS EVERY 6 (SIX) HOURS AS NEEDED FOR WHEEZING OR SHORTNESS OF BREATH.   amoxicillin 500 MG capsule Commonly known as: AMOXIL Take 4 capsules (2,000 mg total) by mouth as directed. Take 4 tablets 1 hour prior to dental work, including cleanings.   atorvastatin 40 MG tablet Commonly known as: LIPITOR Take 1 tablet (40 mg total) by mouth daily at 6 PM.   blood glucose meter kit and supplies Kit Dispense based on patient and insurance preference. Use up to four times daily as directed. (FOR ICD-9 250.00, 250.01).   carvedilol 6.25 MG tablet Commonly known as: COREG Take 1 tablet (6.25 mg total) by mouth 2 (two) times daily with a meal.   Centrum Silver 50+Men Tabs Take 1 tablet by mouth daily.   cyclobenzaprine 10 MG tablet Commonly known as: FLEXERIL Take 10 mg by mouth 3 (three) times daily as needed for muscle spasms.   Eliquis 5 MG Tabs tablet Generic drug: apixaban Take 1 tablet (5 mg total) by mouth 2 (two) times daily.   Entresto 24-26 MG Generic drug: sacubitril-valsartan Take 1 tablet by mouth 2 (two) times daily.    Farxiga 10 MG Tabs tablet Generic drug: dapagliflozin propanediol Take 1 tablet (10 mg total) by mouth daily before breakfast.   ferrous sulfate 325 (65 FE) MG tablet Take 1 tablet (325 mg total) by mouth every other day. What changed: when to take this   FreeStyle Libre [place] 1 each by Does not apply route as needed.   FreeStyle Libre 14 Day Sensor Misc Use one applicator every 14 (fourteen) days.   furosemide 40 MG tablet Commonly known as: LASIX Take 1.5 tablets (60 mg total) by mouth 2 (two) times daily.   gabapentin 800 MG tablet Commonly known as: Neurontin Take 1 tablet (800 mg total) by mouth every 8 (eight) hours. What changed: when to take this   glipiZIDE 10 MG tablet Commonly known as: GLUCOTROL TAKE 1 TABLET (10 MG TOTAL) BY MOUTH 2 (TWO) TIMES DAILY BEFORE A MEAL.   insulin glargine 100 UNIT/ML injection Commonly known as: LANTUS Inject 0.2 mLs (20 Units total) into  the skin daily.   Insulin Lispro Prot & Lispro (75-25) 100 UNIT/ML Kwikpen Commonly known as: HumaLOG Mix 75/25 KwikPen Inject 30 Units into the skin 2 (two) times daily.   metFORMIN 500 MG tablet Commonly known as: GLUCOPHAGE TAKE 1 TABLET (500 MG TOTAL) BY MOUTH 2 (TWO) TIMES DAILY WITH A MEAL.   pantoprazole 40 MG tablet Commonly known as: PROTONIX Take 1 tablet (40 mg total) by mouth daily.   potassium chloride SA 20 MEQ tablet Commonly known as: KLOR-CON M Take 2 tablets (40 mEq total) by mouth daily.   QUEtiapine 300 MG tablet Commonly known as: SEROquel Take 1.5 tab by mouth daily   spironolactone 25 MG tablet Commonly known as: ALDACTONE Take 0.5 tablets (12.5 mg total) by mouth at bedtime.   TRUEplus Pen Needles 31G X 6 MM Misc Generic drug: Insulin Pen Needle USE AS DIRECTED   TRUEplus Pen Needles 31G X 6 MM Misc Generic drug: Insulin Pen Needle USE AS DIRECTED        Disposition:  Home Discharge Instructions     Call MD for:  difficulty  breathing, headache or visual disturbances   Complete by: As directed    Call MD for:  extreme fatigue   Complete by: As directed    Call MD for:  hives   Complete by: As directed    Call MD for:  persistant dizziness or light-headedness   Complete by: As directed    Call MD for:  persistant nausea and vomiting   Complete by: As directed    Call MD for:  redness, tenderness, or signs of infection (pain, swelling, redness, odor or green/yellow discharge around incision site)   Complete by: As directed    Call MD for:  severe uncontrolled pain   Complete by: As directed    Call MD for:  temperature >100.4   Complete by: As directed    Diet - low sodium heart healthy   Complete by: As directed    Discharge instructions   Complete by: As directed    [facility] Procedure, Care After  Procedure MD: Dr. [facility] [place] Clinical Coordinator: Lenice Llamas, RN  This sheet gives you information about how to care for yourself after your procedure. Your health care provider may also give you more specific instructions. If you have problems or questions, contact your health care provider.  What can I expect after the procedure? After the procedure, it is common to have: Bruising around your puncture site. Tenderness around your puncture site. Tiredness (fatigue).  Medication instructions It is very important to continue to take your blood thinner as directed by your doctor after the Watchman procedure. Call your procedure doctor's office with question or concerns. If you are on Coumadin (warfarin), you will have your INR checked the week after your procedure, with a goal INR of 2.0 - 3.0. Please follow your medication instructions on your discharge summary. Only take the medications listed on your discharge paperwork.  Follow up You will be seen in 1 month after your procedure in the [facility] [place] will have another TEE (Transesophageal Echocardiogram) approximately 6 weeks  after your procedure mark to check your device You will follow up the MD/APP who performed your procedure 6 months after your procedure The Watchman Clinical Coordinator will check in with you from time to time, including 1 and 2 years after your procedure.    Follow these instructions at home: Puncture site care  Follow instructions from your health care  provider about how to take care of your puncture site. Make sure you: If present, leave stitches (sutures), skin glue, or adhesive strips in place.  If a large square bandage is present, this may be removed 24 hours after surgery.  Check your puncture site every day for signs of infection. Check for: Redness, swelling, or pain. Fluid or blood. If your puncture site starts to bleed, lie down on your back, apply firm pressure to the area, and contact your health care provider. Warmth. Pus or a bad smell. Driving Do not drive yourself home if you received sedation Do not drive for at least 4 days after your procedure or however long your health care provider recommends. (Do not resume driving if you have previously been instructed not to drive for other health reasons.) Do not spend greater than 1 hour at a time in a car for the first 3 days. Stop and take a break with a 5 minute walk at least every hour.  Do not drive or use heavy machinery while taking prescription pain medicine.  Activity Avoid activities that take a lot of effort, including exercise, for at least 7 days after your procedure. For the first 3 days, avoid sitting for longer than one hour at a time.  Avoid alcoholic beverages, signing paperwork, or participating in legal proceedings for 24 hours after receiving sedation Do not lift anything that is heavier than 10 lb (4.5 kg) for one week.  No sexual activity for 1 week.  Return to your normal activities as told by your health care provider. Ask your health care provider what activities are safe for you. General  instructions Take over-the-counter and prescription medicines only as told by your health care provider. Do not use any products that contain nicotine or tobacco, such as cigarettes and e-cigarettes. If you need help quitting, ask your health care provider. You may shower after 24 hours, but Do not take baths, swim, or use a hot tub for 1 week.  Do not drink alcohol for 24 hours after your procedure. Keep all follow-up visits as told by your health care provider. This is important. Dental Work: You will require antibiotics prior to any dental work, including cleanings, for 6 months after your Watchman implantation to help protect you from infection. After 6 months, antibiotics are no longer required. Contact a health care provider if: You have redness, mild swelling, or pain around your puncture site. You have soreness in your throat or at your puncture site that does not improve after several days You have fluid or blood coming from your puncture site that stops after applying firm pressure to the area. Your puncture site feels warm to the touch. You have pus or a bad smell coming from your puncture site. You have a fever. You have chest pain or discomfort that spreads to your neck, jaw, or arm. You are sweating a lot. You feel nauseous. You have a fast or irregular heartbeat. You have shortness of breath. You are dizzy or light-headed and feel the need to lie down. You have pain or numbness in the arm or leg closest to your puncture site. Get help right away if: Your puncture site suddenly swells. Your puncture site is bleeding and the bleeding does not stop after applying firm pressure to the area. These symptoms may represent a serious problem that is an emergency. Do not wait to see if the symptoms will go away. Get medical help right away. Call your local emergency services (911  in the [place] Do not drive yourself to the hospital. Summary After the procedure, it is normal to have  bruising and tenderness at the puncture site in your groin, neck, or forearm. Check your puncture site every day for signs of infection. Get help right away if your puncture site is bleeding and the bleeding does not stop after applying firm pressure to the area. This is a medical emergency.  This information is not intended to replace advice given to you by your health care provider. Make sure you discuss any questions you have with your health care provider.   Increase activity slowly   Complete by: As directed        Follow-up Information     Tommie Raymond, NP Follow up on 11/23/2021.   Specialty: Cardiology Why: at 3:30pm. Please arrive to your appointment by 3:15pm Contact information: [address] [place] [address] [phone]                 Duration of Discharge Encounter: Greater than 30 minutes including physician time.  Signed, Kathyrn Drown, NP  10/28/2021 11:35 AM

## 2021-10-31 ENCOUNTER — Other Ambulatory Visit: Payer: Self-pay | Admitting: Nurse Practitioner

## 2021-10-31 ENCOUNTER — Telehealth: Payer: Self-pay

## 2021-10-31 ENCOUNTER — Other Ambulatory Visit: Payer: Self-pay

## 2021-10-31 MED ORDER — "INSULIN SYRINGE-NEEDLE U-100 31G X 5/16"" 0.3 ML MISC"
3 refills | Status: DC
  Filled 2021-10-31: qty 100, 33d supply, fill #0

## 2021-10-31 NOTE — Telephone Encounter (Signed)
  [facility]   Watchman Team  Attempted to contact patient regarding recent discharge.  Left message to call back.

## 2021-11-01 ENCOUNTER — Other Ambulatory Visit (HOSPITAL_COMMUNITY): Payer: Self-pay

## 2021-11-01 NOTE — Telephone Encounter (Signed)
Left message to call back  

## 2021-11-01 NOTE — Progress Notes (Signed)
Paramedicine Encounter    Patient ID: [name], male    DOB: 1961-03-02, 60 y.o.   MRN: [id number]   Patient Care Team: [name], NP as PCP - General (Adult Health Nurse Practitioner) [name], MD as PCP - Cardiology (Cardiology) [name], MD as PCP - Advanced Heart Failure (Cardiology) [name], MD as PCP - Electrophysiology (Cardiology) [name], LCSW as Social Worker (Licensed Clinical Social Worker) [name], MD as Consulting Physician (Gastroenterology)  Patient Active Problem List   Diagnosis Date Noted   Presence of Watchman left atrial appendage closure device 10/28/2021   Atrial fibrillation ([place]) 10/27/2021   [place]    Chronic diastolic CHF (congestive heart failure) ([name])    GI bleed 06/29/2021   Severe anemia 06/29/2021   Adenomatous polyp of ascending colon    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   Acute blood loss anemia    Angiodysplasia of stomach    Chronic anticoagulation    CHF exacerbation ([name]) 06/15/2020   AKI (acute kidney injury) ([name]) 06/15/2020   CKD (chronic kidney disease), stage III ([name]) 06/15/2020   Anemia due to chronic blood loss    Gastric AVM    Angiodysplasia of duodenum with hemorrhage    Acute on chronic systolic (congestive) heart failure ([place]) 04/05/2020   Anemia 04/05/2020   PAF (paroxysmal atrial fibrillation) ([name]) 04/05/2020   OSA on CPAP 04/05/2020   Syncope and collapse 04/05/2020   Type 2 diabetes mellitus with hyperglycemia, with long-term current use of insulin ([name]) 12/03/2019   Diabetic polyneuropathy associated with type 2 diabetes mellitus ([name]) 12/03/2019   Erectile dysfunction 12/03/2019   Paranoid schizophrenia, chronic condition ([name]) 01/26/2015   Severe recurrent major depressive disorder with psychotic features ([name]) 01/26/2015   GAD (generalized anxiety disorder) 01/26/2015   OCD (obsessive compulsive disorder) 01/26/2015   Panic  disorder without agoraphobia 01/26/2015   Insomnia 01/26/2015    Current Outpatient Medications:    acetaminophen (TYLENOL) 500 MG tablet, Take 2 tablets (1,000 mg total) by mouth 2 (two) times daily as needed. (Patient taking differently: Take 1,000 mg by mouth in the morning and at bedtime.), Disp: 180 tablet, Rfl: 3   albuterol (VENTOLIN HFA) 108 (90 Base) MCG/ACT inhaler, INHALE 2 PUFFS INTO THE LUNGS EVERY 6 (SIX) HOURS AS NEEDED FOR WHEEZING OR SHORTNESS OF BREATH., Disp: 18 g, Rfl: 3   amoxicillin (AMOXIL) 500 MG capsule, Take 4 capsules (2,000 mg total) by mouth as directed. Take 4 tablets 1 hour prior to dental work, including cleanings., Disp: 12 capsule, Rfl: 12   apixaban (ELIQUIS) 5 MG TABS tablet, Take 1 tablet (5 mg total) by mouth 2 (two) times daily., Disp: 180 tablet, Rfl: 3   atorvastatin (LIPITOR) 40 MG tablet, Take 1 tablet (40 mg total) by mouth daily at 6 PM., Disp: 90 tablet, Rfl: 3   blood glucose meter kit and supplies KIT, Dispense based on patient and insurance preference. Use up to four times daily as directed. (FOR ICD-9 250.00, 250.01)., Disp: 1 each, Rfl: 0   carvedilol (COREG) 6.25 MG tablet, Take 1 tablet (6.25 mg total) by mouth 2 (two) times daily with a meal., Disp: 60 tablet, Rfl: 3   Continuous Blood Gluc Receiver (FREESTYLE LIBRE 14 DAY READER) DEVI, 1 each by Does not apply route as needed., Disp: 1 each, Rfl: 11   Continuous Blood Gluc Sensor (FREESTYLE LIBRE 14 DAY SENSOR) MISC, Use one applicator every 14 (fourteen) days., Disp: 6  each, Rfl: 3   cyclobenzaprine (FLEXERIL) 10 MG tablet, Take 10 mg by mouth 3 (three) times daily as needed for muscle spasms., Disp: , Rfl:    dapagliflozin propanediol (FARXIGA) 10 MG TABS tablet, Take 1 tablet (10 mg total) by mouth daily before breakfast., Disp: 30 tablet, Rfl: 11   ferrous sulfate 325 (65 FE) MG tablet, Take 1 tablet (325 mg total) by mouth every other day. (Patient taking differently: Take 325 mg by mouth  every Monday, Wednesday, and Friday.), Disp: 30 tablet, Rfl: 0   furosemide (LASIX) 40 MG tablet, Take 1.5 tablets (60 mg total) by mouth 2 (two) times daily., Disp: 90 tablet, Rfl: 3   gabapentin (NEURONTIN) 800 MG tablet, Take 1 tablet (800 mg total) by mouth every 8 (eight) hours. (Patient taking differently: Take 800 mg by mouth 2 (two) times daily.), Disp: 90 tablet, Rfl: 11   glipiZIDE (GLUCOTROL) 10 MG tablet, TAKE 1 TABLET (10 MG TOTAL) BY MOUTH 2 (TWO) TIMES DAILY BEFORE A MEAL., Disp: 60 tablet, Rfl: 6   insulin glargine (LANTUS) 100 UNIT/ML injection, Inject 0.2 mLs (20 Units total) into the skin daily., Disp: 30 mL, Rfl: 3   Insulin Lispro Prot & Lispro (HUMALOG MIX 75/25 KWIKPEN) (75-25) 100 UNIT/ML Kwikpen, Inject 30 Units into the skin 2 (two) times daily., Disp: 15 mL, Rfl: 11   Insulin Pen Needle (TRUEPLUS 5-BEVEL PEN NEEDLES) 31G X 6 MM MISC, See admin instructions., Disp: 100 each, Rfl: 0   Insulin Syringe-Needle U-100 (TRUEPLUS INSULIN SYRINGE) 31G X 5/16" 0.3 ML MISC, USE 3 TIMES DAILY TO INJECT INSULIN, Disp: 300 each, Rfl: 3   metFORMIN (GLUCOPHAGE) 500 MG tablet, TAKE 1 TABLET (500 MG TOTAL) BY MOUTH 2 (TWO) TIMES DAILY WITH A MEAL., Disp: 60 tablet, Rfl: 6   Multiple Vitamins-Minerals (CENTRUM SILVER 50+MEN) TABS, Take 1 tablet by mouth daily., Disp: , Rfl:    pantoprazole (PROTONIX) 40 MG tablet, Take 1 tablet (40 mg total) by mouth daily., Disp: 90 tablet, Rfl: 3   potassium chloride SA (KLOR-CON M) 20 MEQ tablet, Take 2 tablets (40 mEq total) by mouth daily., Disp: 60 tablet, Rfl: 3   QUEtiapine (SEROQUEL) 300 MG tablet, Take 1.5 tab by mouth daily, Disp: 45 tablet, Rfl: 0   sacubitril-valsartan (ENTRESTO) 24-26 MG, Take 1 tablet by mouth 2 (two) times daily., Disp: 180 tablet, Rfl: 3   spironolactone (ALDACTONE) 25 MG tablet, Take 0.5 tablets (12.5 mg total) by mouth at bedtime., Disp: 45 tablet, Rfl: 3   TRUEPLUS PEN NEEDLES 31G X 6 MM MISC, USE AS DIRECTED, Disp: 100  each, Rfl: 0 No Known Allergies   Social History   Socioeconomic History   Marital status: Legally Separated    Spouse name: Not on file   Number of children: Not on file   Years of education: Not on file   Highest education level: Not on file  Occupational History   Not on file  Tobacco Use   Smoking status: Former   Smokeless tobacco: Never  Vaping Use   Vaping Use: Never used  Substance and Sexual Activity   Alcohol use: Not Currently    Alcohol/week: 2.0 standard drinks    Types: 2 Cans of beer per week   Drug use: No   Sexual activity: Yes  Other Topics Concern   Not on file  Social History Narrative   Not on file   Social Determinants of Health   Financial Resource Strain: Not on file  Food  Insecurity: No Food Insecurity   Worried About Charity fundraiser in the Last Year: Never true   Ran Out of Food in the Last Year: Never true  Transportation Needs: Unmet Transportation Needs   Lack of Transportation (Medical): Yes   Lack of Transportation (Non-Medical): Yes  Physical Activity: Not on file  Stress: Not on file  Social Connections: Not on file  Intimate Partner Violence: Not on file    Physical Exam Vitals reviewed.  Constitutional:      Appearance: Normal appearance. He is normal weight.  HENT:     Head: Normocephalic.     Nose: Nose normal.     Mouth/Throat:     Mouth: Mucous membranes are moist.     Pharynx: Oropharynx is clear.  Eyes:     Conjunctiva/sclera: Conjunctivae normal.     Pupils: Pupils are equal, round, and reactive to light.  Cardiovascular:     Rate and Rhythm: Normal rate and regular rhythm.     Pulses: Normal pulses.     Heart sounds: Normal heart sounds.  Pulmonary:     Effort: Pulmonary effort is normal.     Breath sounds: Normal breath sounds.  Abdominal:     General: Abdomen is flat.     Palpations: Abdomen is soft.  Musculoskeletal:        General: No swelling. Normal range of motion.     Cervical back: Normal  range of motion.     Right lower leg: No edema.     Left lower leg: No edema.  Skin:    General: Skin is warm and dry.     Capillary Refill: Capillary refill takes less than 2 seconds.  Neurological:     General: No focal deficit present.     Mental Status: He is alert. Mental status is at baseline.  Psychiatric:        Mood and Affect: Mood normal.    Arrived for home visit for [name] who reports feeling good today with some mild shortness of breath with activity.This is normal for [name], he states he needs to exercise more to help him lose weight but has had procedures lately that have kept him from this. [name] denied any abnormal bleeding at home, dizziness, chest pain. I obtained vitals and assessment. Lungs clear, no swelling or edema noted. CBG- 404 (no insulin today and ate a meal 55mns PTA) He gave himself his Lantus at 1527. I wrote down reminders for insulin dosages for CGerald Stabsand placed them on refrigerator. CGerald Stabshas a Freestyle and checks his sugar with same a few times a day. I reminded him to check it before and after meals and before and after insulin. He agreed.   We reviewed medications and I filled 2 pill boxes setting him up for two weeks of medications.   We reviewed upcoming appointments and these were written down for reminder too. He states he has transportation for these.   I plan to see CGerald Stabsin two weeks unless he needs me before 12/20. Home visit complete.    Refills: Gabapentin 800 (refill on 12/14.)    Future Appointments  Date Time Provider [place] 11/04/2021 11:00 AM KVevelyn Francois NP [place]  11/23/2021  3:30 PM MTommie Raymond NP [facility]  12/02/2021  7:40 AM [facility] DEVICE REMOTES [facility]  12/12/2021 11:30 AM [facility] PA/NP [facility] None  03/03/2022  7:40 AM [facility] DEVICE REMOTES [facility]  03/30/2022  1:20 PM KDionisio David  M, NP [facility] None     ACTION: Home visit  completed

## 2021-11-02 ENCOUNTER — Telehealth: Payer: Self-pay | Admitting: Pharmacist

## 2021-11-02 NOTE — Telephone Encounter (Addendum)
[employer] for update on status of Ozempic pt assistance application. Was advised that application was approved. Called 12/7 for update on status of med being shipped and was advised order is still in process. EMT [name] will give injections to pt once shipment comes in.

## 2021-11-02 NOTE — Telephone Encounter (Signed)
Left message to call back  

## 2021-11-03 NOTE — Telephone Encounter (Signed)
Attempted TOC call again. This time, no VM picked up - unable to leave message.  Attempted to call the patient's sister (other contact on file). Left message to have the patient call back.

## 2021-11-04 ENCOUNTER — Telehealth (HOSPITAL_COMMUNITY): Payer: Self-pay | Admitting: Surgery

## 2021-11-04 ENCOUNTER — Other Ambulatory Visit: Payer: Self-pay

## 2021-11-04 ENCOUNTER — Other Ambulatory Visit (HOSPITAL_COMMUNITY): Payer: Self-pay | Admitting: Surgery

## 2021-11-04 ENCOUNTER — Encounter: Payer: Self-pay | Admitting: Nurse Practitioner

## 2021-11-04 ENCOUNTER — Ambulatory Visit (INDEPENDENT_AMBULATORY_CARE_PROVIDER_SITE_OTHER): Payer: Medicare (Managed Care) | Admitting: Nurse Practitioner

## 2021-11-04 VITALS — BP 135/84 | HR 88 | Temp 97.3°F | Ht 74.0 in | Wt 321.0 lb

## 2021-11-04 DIAGNOSIS — R351 Nocturia: Secondary | ICD-10-CM

## 2021-11-04 DIAGNOSIS — E1165 Type 2 diabetes mellitus with hyperglycemia: Secondary | ICD-10-CM

## 2021-11-04 DIAGNOSIS — I480 Paroxysmal atrial fibrillation: Secondary | ICD-10-CM

## 2021-11-04 DIAGNOSIS — Z794 Long term (current) use of insulin: Secondary | ICD-10-CM

## 2021-11-04 DIAGNOSIS — N529 Male erectile dysfunction, unspecified: Secondary | ICD-10-CM

## 2021-11-04 DIAGNOSIS — G4733 Obstructive sleep apnea (adult) (pediatric): Secondary | ICD-10-CM

## 2021-11-04 MED ORDER — ALBUTEROL SULFATE HFA 108 (90 BASE) MCG/ACT IN AERS
2.0000 | INHALATION_SPRAY | Freq: Four times a day (QID) | RESPIRATORY_TRACT | 3 refills | Status: DC | PRN
  Filled 2021-11-04: qty 18, 25d supply, fill #0
  Filled 2021-11-16: qty 18, 25d supply, fill #1

## 2021-11-04 MED ORDER — ATORVASTATIN CALCIUM 40 MG PO TABS
40.0000 mg | ORAL_TABLET | Freq: Every day | ORAL | 1 refills | Status: DC
  Filled 2021-11-04: qty 90, 90d supply, fill #0

## 2021-11-04 MED ORDER — INSULIN GLARGINE 100 UNIT/ML ~~LOC~~ SOLN
20.0000 [IU] | Freq: Every day | SUBCUTANEOUS | 1 refills | Status: DC
  Filled 2021-11-04: qty 10, 28d supply, fill #0

## 2021-11-04 NOTE — Patient Instructions (Signed)

## 2021-11-04 NOTE — Progress Notes (Signed)
[name], [place]  [address] Phone:  [phone]   Fax:  [phone]   Established Patient Office Visit  Subjective:  Patient ID: [name], male    DOB: December 02, 1960  Age: 60 y.o. MRN: [id number]  CC:  Chief Complaint  Patient presents with   Hospitalization Follow-up    12/1/-12/2, PAF    HPI [name] presents for follow up. He  has a past medical history of Anxiety, Atrial fibrillation ([place]), CAD (coronary artery disease), Cardiomyopathy ([place]), CHF (congestive heart failure) ([place]) (09/2019), Depression, Diabetes mellitus, Erectile dysfunction (11/2019), GI bleeding, H/O right heart catheterization (09/2019), Hypertension, Presence of Watchman left atrial appendage closure device (10/27/2021), Schizophrenia ([place]), and Sleep apnea.   Patient is in today for hospital follow-up. Recently admitted for PAF. Hospital course of treatment was from 10/27/21 to 10/28/21.  During hospital course underwent left atrial appendage occlusive device placement with 72m FLX Watchman per Dr. LQuentin Oreon 10/27/21. He was monitored on telemetry overnight which demonstrated NSR with 1st degree AV block. He had an issue with groin bleeding after the procedure at which time a Femstop was placed and monitored by cath lab staff.    On Eliquis 521mBID. A repeat TEE at approximately 45 days will be performed to ensure proper seal of the device.   He reports feeling ok.  Denies headache, dizziness, visual changes, shortness of breath, dyspnea on exertion, chest pain, nausea, vomiting or any edema. He denies any abnormal bleeding or bruises.   He is monitoring his CG 121 to 349. He reports that this was related to diet sweet potato pie and cake. He has cut back.   Past Medical History:  Diagnosis Date   Anxiety    Atrial fibrillation (HCC)    CAD (coronary artery disease)    Cardiomyopathy (HCC)    CHF ([place]estive heart failure) ([place]    Depression    Diabetes mellitus    Erectile dysfunction 11/2019   GI bleeding    H/O right heart catheterization 09/2019   Hypertension    Presence of Watchman left atrial appendage closure device 10/27/2021   27 mm Watchman Flex Device [facility] Dr[facility]Quentin [facility]re [facility]   Sleep apnea    uses cpap    Past Surgical History:  Procedure Laterality Date   BIOPSY  04/19/2021   Procedure: BIOPSY;  Surgeon: DaDoran StablerMD;  [facility];  Service: Gastroenterology;;   COLONOSCOPY WITH PROPOFOL N/A 04/16/2021   Procedure: COLONOSCOPY WITH PROPOFOL;  Surgeon: BeThornton ParkMD;  [place] [facility]: [facility];  Laterality: N/A;   CORONARY STENT PLACEMENT     ENTEROSCOPY N/A 04/08/2020   Procedure: ENTEROSCOPY;  Surgeon: DaDoran StablerMD;  [place] [facility] Service: Gastroenterology;  Laterality: N/A;   ENTEROSCOPY N/A 06/17/2020   Procedure: ENTEROSCOPY;  Surgeon: PeIrene ShipperMD;  [facility]  Service: Endoscopy;  Laterality: N/A;   ENTEROSCOPY N/A 04/15/2021   Procedure: ENTEROSCOPY;  Surgeon: BeThornton ParkMD;  [place] [facility] Service: Gastroenterology;  Laterality: N/A;   ENTEROSCOPY N/A 04/19/2021   Procedure: ENTEROSCOPY;  Surgeon: DaDoran StablerMD;  [facility]: G[facility]ology;  Laterality: N/A;   ENTEROSCOPY N/A 07/01/2021   Procedure: ENTEROSCOPY;  Surgeon: PyJerene BearsMD;  [facility];  Service: Gastroenterology;  Laterality: N/A;   GIVENS CAPSULE STUDY  04/16/2021   Procedure:  GIVENS CAPSULE STUDY;  Surgeon: [name], MD;  Location: [place];  Service: Gastroenterology;;   HEMOSTASIS CLIP PLACEMENT  04/08/2020   Procedure: HEMOSTASIS CLIP PLACEMENT;  Surgeon: Doran Stabler, MD;  Location: [place];  Service: Gastroenterology;;   HEMOSTASIS CLIP PLACEMENT  07/01/2021   Procedure: HEMOSTASIS CLIP PLACEMENT;  Surgeon: Jerene Bears, MD;   Location: [place];  Service: Gastroenterology;;   HEMOSTASIS CONTROL  04/08/2020   Procedure: HEMOSTASIS CONTROL;  Surgeon: Doran Stabler, MD;  [facility];  Service: Gastroenterology;;   HEMOSTASIS CONTROL  06/17/2020   Procedure: HEMOSTASIS CONTROL;  Surgeon: Irene Shipper, MD;  Location: [place];  Service: Endoscopy;;   HERNIA REPAIR     HOT HEMOSTASIS N/A 04/15/2021   Procedure: HOT HEMOSTASIS (ARGON PLASMA COAGULATION/BICAP);  Surgeon: [name], MD;  Location: [place];  Service: Gastroenterology;  Laterality: N/A;   HOT HEMOSTASIS N/A 07/01/2021   Procedure: HOT HEMOSTASIS (ARGON PLASMA COAGULATION/BICAP);  Surgeon: Jerene Bears, MD;  Location: [facility] ENDOSCOPY;  Service: Gastroenterology;  Laterality: N/A;   ICD IMPLANT N/A 09/03/2020   Procedure: ICD IMPLANT;  Surgeon: [name], MD;  Location: [place] CV LAB;  Service: Cardiovascular;  Laterality: N/A;   LEFT ATRIAL APPENDAGE OCCLUSION N/A 10/27/2021   Procedure: LEFT ATRIAL APPENDAGE OCCLUSION;  Surgeon: [name], MD;  Location: [place] CV LAB;  Service: Cardiovascular;  Laterality: N/A;   POLYPECTOMY  04/16/2021   Procedure: POLYPECTOMY;  Surgeon: [name], MD;  [facility];  Service: Gastroenterology;;   RIGHT/LEFT HEART CATH AND CORONARY ANGIOGRAPHY N/A 10/14/2019   Procedure: RIGHT/LEFT HEART CATH AND CORONARY ANGIOGRAPHY;  Surgeon: Belva Crome, MD;  Location: [place] CV LAB;  Service: Cardiovascular;  Laterality: N/A;   SUBMUCOSAL TATTOO INJECTION  04/19/2021   Procedure: SUBMUCOSAL TATTOO INJECTION;  Surgeon: Doran Stabler, MD;  Location: [place];  Service: Gastroenterology;;   TEE WITHOUT CARDIOVERSION N/A 10/27/2021   Procedure: TRANSESOPHAGEAL ECHOCARDIOGRAM (TEE);  Surgeon: [name], MD;  Location: [place] CV LAB;  Service: Cardiovascular;  Laterality: N/A;    Family History  Problem Relation Age of Onset   Heart  failure Mother    Mental illness Sister    Mental illness Sister     Social History   Socioeconomic History   Marital status: Legally Separated    Spouse name: Not on file   Number of children: Not on file   Years of education: Not on file   Highest education level: Not on file  Occupational History   Not on file  Tobacco Use   Smoking status: Former   Smokeless tobacco: Never  Vaping Use   Vaping Use: Never used  Substance and Sexual Activity   Alcohol use: Not Currently    Alcohol/week: 2.0 standard drinks    Types: 2 Cans of beer per week   Drug use: No   Sexual activity: Yes  Other Topics Concern   Not on file  Social History Narrative   Not on file   Social Determinants of Health   Financial Resource Strain: Not on file  Food Insecurity: No Food Insecurity   Worried About Charity fundraiser in the Last Year: Never true   Ran Out of Food in the Last Year: Never true  Transportation Needs: Unmet Transportation Needs   Lack of Transportation (Medical): Yes   Lack of Transportation (Non-Medical): Yes  Physical Activity: Not on file  Stress: Not on  file  Social Connections: Not on file  Intimate Partner Violence: Not on file    Outpatient Medications Prior to Visit  Medication Sig Dispense Refill   acetaminophen (TYLENOL) 500 MG tablet Take 2 tablets (1,000 mg total) by mouth 2 (two) times daily as needed. (Patient taking differently: Take 1,000 mg by mouth in the morning and at bedtime.) 180 tablet 3   amoxicillin (AMOXIL) 500 MG capsule Take 4 capsules (2,000 mg total) by mouth as directed. Take 4 tablets 1 hour prior to dental work, including cleanings. 12 capsule 12   apixaban (ELIQUIS) 5 MG TABS tablet Take 1 tablet (5 mg total) by mouth 2 (two) times daily. 180 tablet 3   blood glucose meter kit and supplies KIT Dispense based on patient and insurance preference. Use up to four times daily as directed. (FOR ICD-9 250.00, 250.01). 1 each 0   carvedilol  (COREG) 6.25 MG tablet Take 1 tablet (6.25 mg total) by mouth 2 (two) times daily with a meal. 60 tablet 3   Continuous Blood Gluc Receiver (FREESTYLE LIBRE 14 DAY READER) DEVI 1 each by Does not apply route as needed. 1 each 11   Continuous Blood Gluc Sensor (FREESTYLE LIBRE 14 DAY SENSOR) MISC Use one applicator every 14 (fourteen) days. 6 each 3   cyclobenzaprine (FLEXERIL) 10 MG tablet Take 10 mg by mouth 3 (three) times daily as needed for muscle spasms.     dapagliflozin propanediol (FARXIGA) 10 MG TABS tablet Take 1 tablet (10 mg total) by mouth daily before breakfast. 30 tablet 11   ferrous sulfate 325 (65 FE) MG tablet Take 1 tablet (325 mg total) by mouth every other day. (Patient taking differently: Take 325 mg by mouth every Monday, Wednesday, and Friday.) 30 tablet 0   furosemide (LASIX) 40 MG tablet Take 1.5 tablets (60 mg total) by mouth 2 (two) times daily. 90 tablet 3   gabapentin (NEURONTIN) 800 MG tablet Take 1 tablet (800 mg total) by mouth every 8 (eight) hours. (Patient taking differently: Take 800 mg by mouth 2 (two) times daily.) 90 tablet 11   glipiZIDE (GLUCOTROL) 10 MG tablet TAKE 1 TABLET (10 MG TOTAL) BY MOUTH 2 (TWO) TIMES DAILY BEFORE A MEAL. 60 tablet 6   Insulin Lispro Prot & Lispro (HUMALOG MIX 75/25 KWIKPEN) (75-25) 100 UNIT/ML Kwikpen Inject 30 Units into the skin 2 (two) times daily. 15 mL 11   Insulin Pen Needle (TRUEPLUS 5-BEVEL PEN NEEDLES) 31G X 6 MM MISC See admin instructions. 100 each 0   Insulin Syringe-Needle U-100 (TRUEPLUS INSULIN SYRINGE) 31G X 5/16" 0.3 ML MISC USE 3 TIMES DAILY TO INJECT INSULIN 300 each 3   metFORMIN (GLUCOPHAGE) 500 MG tablet TAKE 1 TABLET (500 MG TOTAL) BY MOUTH 2 (TWO) TIMES DAILY WITH A MEAL. 60 tablet 6   Multiple Vitamins-Minerals (CENTRUM SILVER 50+MEN) TABS Take 1 tablet by mouth daily.     pantoprazole (PROTONIX) 40 MG tablet Take 1 tablet (40 mg total) by mouth daily. 90 tablet 3   potassium chloride SA (KLOR-CON M) 20 MEQ  tablet Take 2 tablets (40 mEq total) by mouth daily. 60 tablet 3   QUEtiapine (SEROQUEL) 300 MG tablet Take 1.5 tab by mouth daily 45 tablet 0   sacubitril-valsartan (ENTRESTO) 24-26 MG Take 1 tablet by mouth 2 (two) times daily. 180 tablet 3   spironolactone (ALDACTONE) 25 MG tablet Take 0.5 tablets (12.5 mg total) by mouth at bedtime. 45 tablet 3   TRUEPLUS PEN NEEDLES 31G  X 6 MM MISC USE AS DIRECTED 100 each 0   albuterol (VENTOLIN HFA) 108 (90 Base) MCG/ACT inhaler INHALE 2 PUFFS INTO THE LUNGS EVERY 6 (SIX) HOURS AS NEEDED FOR WHEEZING OR SHORTNESS OF BREATH. 18 g 3   insulin glargine (LANTUS) 100 UNIT/ML injection Inject 0.2 mLs (20 Units total) into the skin daily. 30 mL 3   atorvastatin (LIPITOR) 40 MG tablet Take 1 tablet (40 mg total) by mouth daily at 6 PM. 90 tablet 3   No facility-administered medications prior to visit.    No Known Allergies  ROS Review of Systems  Genitourinary:        ED this has been going on for some time. He is wanting to be evaluated by a specialist      Objective:    Physical Exam Constitutional:      Appearance: He is obese.     Comments: Increased perspiration to scalp  HENT:     Head: Normocephalic and atraumatic.     Nose: Nose normal.     Mouth/Throat:     Mouth: Mucous membranes are moist.  Cardiovascular:     Rate and Rhythm: Normal rate and regular rhythm.     Pulses: Normal pulses.     Heart sounds: Normal heart sounds.  Pulmonary:     Effort: Pulmonary effort is normal.     Breath sounds: Normal breath sounds.  Abdominal:     Comments: Increased abdominal girth   Musculoskeletal:        General: Normal range of motion.     Cervical back: Normal range of motion.     Right lower leg: No edema.     Left lower leg: No edema.  Skin:    General: Skin is warm and dry.     Capillary Refill: Capillary refill takes less than 2 seconds.  Neurological:     General: No focal deficit present.     Mental Status: He is alert and  oriented to person, place, and time.  Psychiatric:        Mood and Affect: Mood normal.        Behavior: Behavior normal.        Thought Content: Thought content normal.        Judgment: Judgment normal.    BP 135/84 (BP Location: Right Arm, Patient Position: Sitting)   Pulse 88   Temp (!) 97.3 F (36.3 C)   Ht '6\' 2"'  (1.88 m)   Wt (!) 321 lb (145.6 kg)   SpO2 94%   BMI 41.21 kg/m  Wt Readings from Last 3 Encounters:  11/04/21 (!) 321 lb (145.6 kg)  11/01/21 (!) 318 lb (144.2 kg)  10/27/21 (!) 315 lb (142.9 kg)     There are no preventive care reminders to display for this patient.   There are no preventive care reminders to display for this patient.  Lab Results  Component Value Date   TSH 2.340 09/30/2021   Lab Results  Component Value Date   WBC 6.8 10/28/2021   HGB 13.0 10/28/2021   HCT 42.4 10/28/2021   MCV 83.1 10/28/2021   PLT 229 10/28/2021   Lab Results  Component Value Date   NA 135 10/28/2021   K 4.6 10/28/2021   CO2 24 10/28/2021   GLUCOSE 258 (H) 10/28/2021   BUN 21 10/28/2021   CREATININE 1.01 10/28/2021   BILITOT 0.6 06/30/2021   ALKPHOS 57 06/30/2021   AST 17 06/30/2021   ALT 17 06/30/2021  PROT 6.9 06/30/2021   ALBUMIN 2.9 (L) 06/30/2021   CALCIUM 8.5 (L) 10/28/2021   ANIONGAP 8 10/28/2021   EGFR 66 10/19/2021   Lab Results  Component Value Date   CHOL 116 06/06/2021   Lab Results  Component Value Date   HDL 35 (L) 06/06/2021   Lab Results  Component Value Date   LDLCALC 58 06/06/2021   Lab Results  Component Value Date   TRIG 117 06/06/2021   Lab Results  Component Value Date   CHOLHDL 3.3 06/06/2021   Lab Results  Component Value Date   HGBA1C 8.4 (A) 09/30/2021   HGBA1C 8.4 09/30/2021   HGBA1C 8.4 (A) 09/30/2021   HGBA1C 8.4 (A) 09/30/2021      Assessment & Plan:   Problem List Items Addressed This Visit       Cardiovascular and Mediastinum   PAF (paroxysmal atrial fibrillation) (HCC) Stable Continue  to follow-up with cardiology as scheduled for TEE   Relevant Medications   atorvastatin (LIPITOR) 40 MG tablet     Endocrine   Type 2 diabetes mellitus with hyperglycemia, with long-term current use of insulin (HCC Persistent Encourage compliance with current treatment regimen   Encourage regular CBG monitoring Encourage contacting office if excessive hyperglycemia and or hypoglycemia Lifestyle modification with healthy diet (fewer calories, more high fiber foods, whole grains and non-starchy vegetables, lower fat meat and fish, low-fat diary include healthy oils) regular exercise (physical activity) and weight loss Opthalmology exam discussed  Nutritional consult recommended Regular dental visits encouraged Home BP monitoring also encouraged goal <130/80    Relevant Medications   insulin glargine (LANTUS) 100 UNIT/ML injection   atorvastatin (LIPITOR) 40 MG tablet   Other Relevant Orders   Ambulatory referral to Podiatry   Comp. Metabolic Panel (12)   Lipid panel     Other   Erectile dysfunction  Worsening Referral to urology due to cardiac history   Relevant Orders   Ambulatory referral to Urology   Other Visit Diagnoses     Nocturia       Relevant Orders   Ambulatory referral to Urology       Meds ordered this encounter  Medications   albuterol (VENTOLIN HFA) 108 (90 Base) MCG/ACT inhaler    Sig: Inhale 2 puffs into the lungs every 6 (six) hours as needed for wheezing or shortness of breath.    Dispense:  18 g    Refill:  3   insulin glargine (LANTUS) 100 UNIT/ML injection    Sig: Inject 0.2 mLs (20 Units total) into the skin daily.    Dispense:  20 mL    Refill:  1    Order Specific Question:   Supervising Provider    Answer:   Tresa Garter [[id number]]   atorvastatin (LIPITOR) 40 MG tablet    Sig: Take 1 tablet (40 mg total) by mouth daily at 6 PM.    Dispense:  90 tablet    Refill:  1    Order Specific Question:   Supervising Provider    Answer:    Tresa Garter [[id number]]    Follow-up: Return in about 3 months (around 02/02/2022) for follow up [id number].    [name], NP

## 2021-11-04 NOTE — Telephone Encounter (Signed)
I called patient regarding ordered home sleep study. He tells me that he attempted the study at home and "it didn't work".  He prefers at this point to come in to complete the test.  I will forward a message to physician to get that referral in process.

## 2021-11-04 NOTE — Telephone Encounter (Signed)
  [place] Team  Contacted the patient regarding discharge from [facility] on 10/28/2021   The patient understands to follow up with Kathyrn Drown on 11/23/2021 in preparation for TEE on 12/12/2021.  The patient understands discharge instructions? Yes  The patient understands medications and regimen? Yes. He is taking his medications, including Eliquis, as directed.  The patient reports groin sites look healthy with no S/S of bleeding or infection.  The patient understands to call with any questions or concerns prior to scheduled visit.

## 2021-11-04 NOTE — Progress Notes (Signed)
Order placed for split night sleep study per Allena Katz NP.  Patient unable to complete previously ordered home sleep study.

## 2021-11-05 LAB — LIPID PANEL
Chol/HDL Ratio: 4.5 ratio (ref 0.0–5.0)
Cholesterol, Total: 139 mg/dL (ref 100–199)
HDL: 31 mg/dL — ABNORMAL LOW (ref >39)
LDL Chol Calc (NIH): 64 mg/dL (ref 0–99)
Triglycerides: 275 mg/dL — ABNORMAL HIGH (ref 0–149)
VLDL Cholesterol Cal: 44 mg/dL — ABNORMAL HIGH (ref 5–40)

## 2021-11-05 LAB — COMP. METABOLIC PANEL (12)
AST: 18 IU/L (ref 0–40)
Albumin/Globulin Ratio: 1.4 (ref 1.2–2.2)
Albumin: 4.5 g/dL (ref 3.8–4.8)
Alkaline Phosphatase: 118 IU/L (ref 44–121)
BUN/Creatinine Ratio: 16 (ref 10–24)
BUN: 22 mg/dL (ref 8–27)
Bilirubin Total: 0.3 mg/dL (ref 0.0–1.2)
Calcium: 9.8 mg/dL (ref 8.6–10.2)
Chloride: 99 mmol/L (ref 96–106)
Creatinine, Ser: 1.38 mg/dL — ABNORMAL HIGH (ref 0.76–1.27)
Globulin, Total: 3.3 g/dL (ref 1.5–4.5)
Glucose: 227 mg/dL — ABNORMAL HIGH (ref 70–99)
Potassium: 4.7 mmol/L (ref 3.5–5.2)
Sodium: 141 mmol/L (ref 134–144)
Total Protein: 7.8 g/dL (ref 6.0–8.5)
eGFR: 58 mL/min/{1.73_m2} — ABNORMAL LOW (ref >59)

## 2021-11-09 ENCOUNTER — Other Ambulatory Visit: Payer: Self-pay

## 2021-11-10 ENCOUNTER — Telehealth (HOSPITAL_COMMUNITY): Payer: Self-pay

## 2021-11-10 ENCOUNTER — Other Ambulatory Visit: Payer: Self-pay

## 2021-11-10 NOTE — Telephone Encounter (Signed)
Called in medication for refill:  -Gabapentin 800mg 

## 2021-11-12 ENCOUNTER — Other Ambulatory Visit: Payer: Self-pay | Admitting: Nurse Practitioner

## 2021-11-12 MED ORDER — DOXYCYCLINE HYCLATE 100 MG PO CAPS
100.0000 mg | ORAL_CAPSULE | Freq: Every day | ORAL | 0 refills | Status: AC
Start: 2021-11-12 — End: 2021-11-22

## 2021-11-12 NOTE — Progress Notes (Signed)
   [facility] [address] [place], [place]  [address] Phone:  [phone]   Fax:  [phone] 

## 2021-11-14 ENCOUNTER — Other Ambulatory Visit: Payer: Self-pay

## 2021-11-15 ENCOUNTER — Telehealth (HOSPITAL_COMMUNITY): Payer: Self-pay

## 2021-11-15 ENCOUNTER — Other Ambulatory Visit (HOSPITAL_COMMUNITY): Payer: Self-pay

## 2021-11-15 ENCOUNTER — Other Ambulatory Visit: Payer: Self-pay

## 2021-11-15 NOTE — Telephone Encounter (Signed)
Transportation set up:   12/28 [facility] 11:00 appointment Pick up at 10:18  12/28 [facility] [address] 3:15 appointment Pick up at 2:34

## 2021-11-15 NOTE — Progress Notes (Signed)
Paramedicine Encounter    Patient ID: [name], male    DOB: 06/24/1961, 60 y.o.   MRN: [id number]   Arrived for home visit for [facility] where he reports he is feeling good with no complaints. He denied chest pain, shortness of breath, or dizziness. Vitals and assessment obtained. Lungs clear, no swelling noted. [name] has been compliant with his meds over the last two weeks. No missed doses noted.   I reviewed pills and set up pill box for one week.   I confirmed appointments and will be setting up transportation for him.   He has a pain clinic appointment with [facility] on 11/29 for labs at 11:00.    CBG- 222 after a meal no insulin   Farxiga application needs to be renewed I will work on same. He may need samples in the mean time as he only has 14 days left on current fill.   Home visit complete. I will see [name] in one week.   Refills: Metformin Carvedilol Glipizide Potassium Farxiga     Patient Care Team: [name], NP as PCP - General (Adult Health Nurse Practitioner) [name], MD as PCP - Cardiology (Cardiology) [name], MD as PCP - Advanced Heart Failure (Cardiology) [name], MD as PCP - Electrophysiology (Cardiology) [name], LCSW as Social Worker (Licensed Clinical Social Worker) [name], MD as Consulting Physician (Gastroenterology)  Patient Active Problem List   Diagnosis Date Noted   Presence of Watchman left atrial appendage closure device 10/28/2021   Atrial fibrillation ([place]) 10/27/2021   Melena    Chronic diastolic CHF (congestive heart failure) ([place])    GI bleed 06/29/2021   Severe anemia 06/29/2021   Adenomatous polyp of ascending colon    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   Acute blood loss anemia    Angiodysplasia of stomach    Chronic anticoagulation    CHF exacerbation ([place]) 06/15/2020   AKI (acute kidney injury) ([place]) 06/15/2020   CKD (chronic  kidney disease), stage III ([place]) 06/15/2020   Anemia due to chronic blood loss    Gastric AVM    Angiodysplasia of duodenum with hemorrhage    Acute on chronic systolic (congestive) heart failure ([place]) 04/05/2020   Anemia 04/05/2020   PAF (paroxysmal atrial fibrillation) ([place]) 04/05/2020   OSA on CPAP 04/05/2020   Syncope and collapse 04/05/2020   Type 2 diabetes mellitus with hyperglycemia, with long-term current use of insulin ([place]) 12/03/2019   Diabetic polyneuropathy associated with type 2 diabetes mellitus ([place]) 12/03/2019   Erectile dysfunction 12/03/2019   Paranoid schizophrenia, chronic condition ([place]) 01/26/2015   Severe recurrent major depressive disorder with psychotic features ([place]) 01/26/2015   GAD (generalized anxiety disorder) 01/26/2015   OCD (obsessive compulsive disorder) 01/26/2015   Panic disorder without agoraphobia 01/26/2015   Insomnia 01/26/2015    Current Outpatient Medications:    acetaminophen (TYLENOL) 500 MG tablet, Take 2 tablets (1,000 mg total) by mouth 2 (two) times daily as needed. (Patient taking differently: Take 1,000 mg by mouth in the morning and at bedtime.), Disp: 180 tablet, Rfl: 3   albuterol (VENTOLIN HFA) 108 (90 Base) MCG/ACT inhaler, Inhale 2 puffs into the lungs every 6 (six) hours as needed for wheezing or shortness of breath., Disp: 18 g, Rfl: 3   amoxicillin (AMOXIL) 500 MG capsule, Take 4 capsules (2,000 mg total) by mouth as directed. Take 4 tablets 1 hour prior to dental work, including cleanings., Disp: 12 capsule,  Rfl: 12   apixaban (ELIQUIS) 5 MG TABS tablet, Take 1 tablet (5 mg total) by mouth 2 (two) times daily., Disp: 180 tablet, Rfl: 3   atorvastatin (LIPITOR) 40 MG tablet, Take 1 tablet (40 mg total) by mouth daily at 6 PM., Disp: 90 tablet, Rfl: 1   blood glucose meter kit and supplies KIT, Dispense based on patient and insurance preference. Use up to four times daily as directed. (FOR ICD-9 250.00, 250.01)., Disp: 1 each, Rfl:  0   carvedilol (COREG) 6.25 MG tablet, Take 1 tablet (6.25 mg total) by mouth 2 (two) times daily with a meal., Disp: 60 tablet, Rfl: 3   Continuous Blood Gluc Receiver (FREESTYLE LIBRE 14 DAY READER) DEVI, 1 each by Does not apply route as needed., Disp: 1 each, Rfl: 11   Continuous Blood Gluc Sensor (FREESTYLE LIBRE 14 DAY SENSOR) MISC, Use one applicator every 14 (fourteen) days., Disp: 6 each, Rfl: 3   cyclobenzaprine (FLEXERIL) 10 MG tablet, Take 10 mg by mouth 3 (three) times daily as needed for muscle spasms., Disp: , Rfl:    dapagliflozin propanediol (FARXIGA) 10 MG TABS tablet, Take 1 tablet (10 mg total) by mouth daily before breakfast., Disp: 30 tablet, Rfl: 11   doxycycline (VIBRAMYCIN) 100 MG capsule, Take 1 capsule (100 mg total) by mouth daily for 10 days., Disp: 10 capsule, Rfl: 0   ferrous sulfate 325 (65 FE) MG tablet, Take 1 tablet (325 mg total) by mouth every other day. (Patient taking differently: Take 325 mg by mouth every Monday, Wednesday, and Friday.), Disp: 30 tablet, Rfl: 0   furosemide (LASIX) 40 MG tablet, Take 1.5 tablets (60 mg total) by mouth 2 (two) times daily., Disp: 90 tablet, Rfl: 3   gabapentin (NEURONTIN) 800 MG tablet, Take 1 tablet (800 mg total) by mouth every 8 (eight) hours. (Patient taking differently: Take 800 mg by mouth 2 (two) times daily.), Disp: 90 tablet, Rfl: 11   glipiZIDE (GLUCOTROL) 10 MG tablet, TAKE 1 TABLET (10 MG TOTAL) BY MOUTH 2 (TWO) TIMES DAILY BEFORE A MEAL., Disp: 60 tablet, Rfl: 6   insulin glargine (LANTUS) 100 UNIT/ML injection, Inject 0.2 mLs (20 Units total) into the skin daily., Disp: 20 mL, Rfl: 1   Insulin Lispro Prot & Lispro (HUMALOG MIX 75/25 KWIKPEN) (75-25) 100 UNIT/ML Kwikpen, Inject 30 Units into the skin 2 (two) times daily., Disp: 15 mL, Rfl: 11   Insulin Pen Needle (TRUEPLUS 5-BEVEL PEN NEEDLES) 31G X 6 MM MISC, See admin instructions., Disp: 100 each, Rfl: 0   Insulin Syringe-Needle U-100 (TRUEPLUS INSULIN SYRINGE)  31G X 5/16" 0.3 ML MISC, USE 3 TIMES DAILY TO INJECT INSULIN, Disp: 300 each, Rfl: 3   metFORMIN (GLUCOPHAGE) 500 MG tablet, TAKE 1 TABLET (500 MG TOTAL) BY MOUTH 2 (TWO) TIMES DAILY WITH A MEAL., Disp: 60 tablet, Rfl: 6   Multiple Vitamins-Minerals (CENTRUM SILVER 50+MEN) TABS, Take 1 tablet by mouth daily., Disp: , Rfl:    pantoprazole (PROTONIX) 40 MG tablet, Take 1 tablet (40 mg total) by mouth daily., Disp: 90 tablet, Rfl: 3   potassium chloride SA (KLOR-CON M) 20 MEQ tablet, Take 2 tablets (40 mEq total) by mouth daily., Disp: 60 tablet, Rfl: 3   QUEtiapine (SEROQUEL) 300 MG tablet, Take 1.5 tab by mouth daily, Disp: 45 tablet, Rfl: 0   sacubitril-valsartan (ENTRESTO) 24-26 MG, Take 1 tablet by mouth 2 (two) times daily., Disp: 180 tablet, Rfl: 3   spironolactone (ALDACTONE) 25 MG tablet, Take 0.5  tablets (12.5 mg total) by mouth at bedtime., Disp: 45 tablet, Rfl: 3   TRUEPLUS PEN NEEDLES 31G X 6 MM MISC, USE AS DIRECTED, Disp: 100 each, Rfl: 0 No Known Allergies   Social History   Socioeconomic History   Marital status: Legally Separated    Spouse name: Not on file   Number of children: Not on file   Years of education: Not on file   Highest education level: Not on file  Occupational History   Not on file  Tobacco Use   Smoking status: Former   Smokeless tobacco: Never  Vaping Use   Vaping Use: Never used  Substance and Sexual Activity   Alcohol use: Not Currently    Alcohol/week: 2.0 standard drinks    Types: 2 Cans of beer per week   Drug use: No   Sexual activity: Yes  Other Topics Concern   Not on file  Social History Narrative   Not on file   Social Determinants of Health   Financial Resource Strain: Not on file  Food Insecurity: No Food Insecurity   Worried About Charity fundraiser in the Last Year: Never true   Ran Out of Food in the Last Year: Never true  Transportation Needs: Unmet Transportation Needs   Lack of Transportation (Medical): Yes   Lack of  Transportation (Non-Medical): Yes  Physical Activity: Not on file  Stress: Not on file  Social Connections: Not on file  Intimate Partner Violence: Not on file    Physical Exam Vitals reviewed.  Constitutional:      Appearance: He is obese.  HENT:     Head: Normocephalic.     Nose: Nose normal.     Mouth/Throat:     Mouth: Mucous membranes are moist.     Pharynx: Oropharynx is clear.  Eyes:     Conjunctiva/sclera: Conjunctivae normal.     Pupils: Pupils are equal, round, and reactive to light.  Cardiovascular:     Rate and Rhythm: Normal rate and regular rhythm.     Pulses: Normal pulses.     Heart sounds: Normal heart sounds.  Pulmonary:     Effort: Pulmonary effort is normal.     Breath sounds: Normal breath sounds.  Abdominal:     Palpations: Abdomen is soft.  Musculoskeletal:        General: No swelling. Normal range of motion.     Cervical back: Normal range of motion.     Right lower leg: No edema.     Left lower leg: No edema.  Skin:    General: Skin is warm and dry.     Capillary Refill: Capillary refill takes less than 2 seconds.  Neurological:     General: No focal deficit present.     Mental Status: He is alert. Mental status is at baseline.  Psychiatric:        Mood and Affect: Mood normal.        Future Appointments  Date Time Provider [place]  11/23/2021 11:00 AM Lorenda Peck, MD [facility]  11/23/2021  3:30 PM Tommie Raymond, NP [facility]  12/02/2021  7:40 AM [facility] DEVICE REMOTES [facility]  12/12/2021 11:30 AM [facility] PA/NP [facility] None  02/02/2022 11:00 AM [name], NP [facility] None  03/03/2022  7:40 AM [facility] DEVICE REMOTES [facility]  03/30/2022  1:20 PM [name], NP [facility] None     ACTION: Home visit completed

## 2021-11-16 ENCOUNTER — Other Ambulatory Visit: Payer: Self-pay

## 2021-11-17 ENCOUNTER — Other Ambulatory Visit (HOSPITAL_COMMUNITY): Payer: Self-pay

## 2021-11-17 ENCOUNTER — Telehealth (HOSPITAL_COMMUNITY): Payer: Self-pay | Admitting: Pharmacy Technician

## 2021-11-17 ENCOUNTER — Other Ambulatory Visit: Payer: Self-pay

## 2021-11-17 NOTE — Telephone Encounter (Signed)
Advanced Heart Failure Patient Advocate Encounter  [name] (EMT) reached out about patient's AZ&Me renewal. Patient's new [employer] Medicare plan prefers [place] v [place]. Plan is to switch patient over to [place] and d/c Farxiga. Current 30 day co-pay is $47. Public relations account executive and patient is agreeable with plan. I advised her to let me know if the patient cannot afford the medication, then we can seek assistance.  Sent 30 day RX request to [place] (CMA) to send to [place].   Charlann Boxer, CPhT

## 2021-11-21 NOTE — H&P (View-Only) (Signed)
Virtual Visit via Telephone Note   This visit type was conducted due to national recommendations for restrictions regarding the COVID-19 Pandemic (e.g. social distancing) in an effort to limit this patient's exposure and mitigate transmission in our community.  Due to his co-morbid illnesses, this patient is at least at moderate risk for complications without adequate follow up.  This format is felt to be most appropriate for this patient at this time.  The patient did not have access to video technology/had technical difficulties with video requiring transitioning to audio format only (telephone).  All issues noted in this document were discussed and addressed.  No physical exam could be performed with this format.  Please refer to the patient's chart for his  consent to telehealth for [facility].   Date:  11/23/2021   ID:  [name], DOB [birth year], MRN [id number] The patient was identified using 2 identifiers.  Patient Location: Home Provider Location: Office/Clinic   PCP:  [name], NP   [facility] Providers Cardiologist:  [name], MD Electrophysiologist:  [name], MD  Advanced Heart Failure:  Loralie Champagne, MD     Evaluation Performed:  Follow-Up Visit  Chief Complaint:  1 month s/p Watchman implant   History of Present Illness:    [name] is a 60 y.o. male with a hx of chronic systolic CHF with an initial LVEF at 25-30% with diffuse hypokinesis, CAD s/p OM2 PCI 2012, DM2, paroxysmal atrial fibrillation intolerant to anticoagulation due to GI bleeding, and schizophrenia. He is followed extensively by the [facility] due to severe systolic CHF as above. Echo 09/2019 showed EF 25-30% with diffuse hypokinesis. LHC performed at that time which showed occluded OM2 at prior stent, 90% D1 stenosis, and extensive diffuse RCA disease with no intervention performed. He was thought to be in paroxysmal atrial fibrillation  and Eliquis was started. Repeat echo 5/21 showed EF 30% with diffuse hypokinesis, mild LVH, PASP 38, mildly decreased RV systolic function, IVC dilated. Biotronik ICD placed.    He was hospitalized in 06/16/21 with upper GI bleeding and CHF exacerbation. EGD showed duodenal AVMs, treated with APC. He was diuresed for CHF. Eliquis was held but restarted at d/c.     Given his GI bleeding, he was referred to Dr. Quentin Ore to discuss LAAO implant. He was seen 09/21/21 at which time he was felt to be a good candidate for Watchman implant. He underwent CT scans 09/29/21 which showed left atrial appendage which was noted to be a large chicken wing type without thrombus. A 31 mm watchman FLX device was recommended with RAO 15 CRA 7 deployment and was ultimately scheduled for LAAO closure with Watchman on 10/27/21.   The patient was admitted and underwent left atrial appendage occlusive device placement with 91mm FLX Watchman per Dr. Quentin Ore on 10/27/21. He had an issue with groin bleeding after the procedure at which time a Femstop was placed and monitored by cath lab staff. Groin stable and Level 0 at discharge. Medication plan will be Eliquis 5mg  BID monotherapy.    Today [name] forgot about his procedure however I was able to contact him by telephone. He has been doing great. We discussed the need to come into the office for labs and EKG tomorrow which he is agreeable to. He denies chest pain, palpitations, SOB, dizziness or syncope.   Past Medical History:  Diagnosis Date   Anxiety    Atrial fibrillation ([place])  CAD (coronary artery disease)    Cardiomyopathy (H[facility])    CHF (congestive heart failure) ([place]) 09/2019   Depression    Diabetes mellitus    Erectile dysfunction 11/2019   GI bleeding    H/O right heart catheterization 09/2019   Hypertension    Presence of Watchman left atrial appendage closure device 10/27/2021   27 mm Watchman Flex Device per Dr. Quentin Ore   Schizophrenia [facility])    Sleep  apnea    uses cpap   Past Surgical History:  Procedure Laterality Date   BIOPSY  04/19/2021   Procedure: BIOPSY;  Surgeon: Doran Stabler, MD;  Location: [facility] ENDOSCOPY;  Service: Gastroenterology;;   COLONOSCOPY WITH PROPOFOL N/A 04/16/2021   Procedure: COLONOSCOPY WITH PROPOFOL;  Surgeon: [name], MD;  Location: [place];  Service: Gastroenterology;  Laterality: N/A;   CORONARY STENT PLACEMENT     ENTEROSCOPY N/A 04/08/2020   Procedure: ENTEROSCOPY;  Surgeon: Doran Stabler, MD;  Location: [place];  Service: Gastroenterology;  Laterality: N/A;   ENTEROSCOPY N/A 06/17/2020   Procedure: ENTEROSCOPY;  Surgeon: Irene Shipper, MD;  Location: [facility] ENDOSCOPY;  Service: Endoscopy;  Laterality: N/A;   ENTEROSCOPY N/A 04/15/2021   Procedure: ENTEROSCOPY;  Surgeon: [name], MD;  Location: [place];  Service: Gastroenterology;  Laterality: N/A;   ENTEROSCOPY N/A 04/19/2021   Procedure: ENTEROSCOPY;  Surgeon: Doran Stabler, MD;  Location: [place];  Service: Gastroenterology;  Laterality: N/A;   ENTEROSCOPY N/A 07/01/2021   Procedure: ENTEROSCOPY;  Surgeon: Jerene Bears, MD;  Location: [facility] ENDOSCOPY;  Service: Gastroenterology;  Laterality: N/A;   GIVENS CAPSULE STUDY  04/16/2021   Procedure: GIVENS CAPSULE STUDY;  Surgeon: [name], MD;  Location: [place];  Service: Gastroenterology;;   HEMOSTASIS CLIP PLACEMENT  04/08/2020   Procedure: HEMOSTASIS CLIP PLACEMENT;  Surgeon: Doran Stabler, MD;  Location: [place];  Service: Gastroenterology;;   HEMOSTASIS CLIP PLACEMENT  07/01/2021   Procedure: HEMOSTASIS CLIP PLACEMENT;  Surgeon: Jerene Bears, MD;  Location: [place];  Service: Gastroenterology;;   HEMOSTASIS CONTROL  04/08/2020   Procedure: HEMOSTASIS CONTROL;  Surgeon: Doran Stabler, MD;  Location[facility] ENDOSCOPY;  Service: Gastroenterology;;   HEMOSTASIS CONTROL  06/17/2020   Procedure: HEMOSTASIS CONTROL;  Surgeon: Irene Shipper,  MD;  Location: [place];  Service: Endoscopy;;   HERNIA REPAIR     HOT HEMOSTASIS N/A 04/15/2021   Procedure: HOT HEMOSTASIS (ARGON PLASMA COAGULATION/BICAP);  Surgeon: [name], MD;  Location: [place];  Service: Gastroenterology;  Laterality: N/A;   HOT HEMOSTASIS N/A 07/01/2021   Procedure: HOT HEMOSTASIS (ARGON PLASMA COAGULATION/BICAP);  Surgeon: Jerene Bears, MD;  Location[facility] ENDOSCOPY;  Service: Gastroenterology;  Laterality: N/A;   ICD IMPLANT N/A 09/03/2020   Procedure: ICD IMPLANT;  Surgeon: [name], MD;  Location: [place] CV LAB;  Service: Cardiovascular;  Laterality: N/A;   LEFT ATRIAL APPENDAGE OCCLUSION N/A 10/27/2021   Procedure: LEFT ATRIAL APPENDAGE OCCLUSION;  Surgeon: [name], MD;  Location: [place] CV LAB;  Service: Cardiovascular;  Laterality: N/A;   POLYPECTOMY  04/16/2021   Procedure: POLYPECTOMY;  Surgeon: [name], MD;  Location: [facility] ENDOSCOPY;  Service: Gastroenterology;;   RIGHT/LEFT HEART CATH AND CORONARY ANGIOGRAPHY N/A 10/14/2019   Procedure: RIGHT/LEFT HEART CATH AND CORONARY ANGIOGRAPHY;  Surgeon: Belva Crome, MD;  Location: [place] CV LAB;  Service: Cardiovascular;  Laterality: N/A;   SUBMUCOSAL TATTOO INJECTION  04/19/2021   Procedure:  SUBMUCOSAL TATTOO INJECTION;  Surgeon: Doran Stabler, MD;  Location: [place];  Service: Gastroenterology;;   TEE WITHOUT CARDIOVERSION N/A 10/27/2021   Procedure: TRANSESOPHAGEAL ECHOCARDIOGRAM (TEE);  Surgeon: [name], MD;  Location: [place] CV LAB;  Service: Cardiovascular;  Laterality: N/A;     No outpatient medications have been marked as taking for the 11/23/21 encounter (Office Visit) with Tommie Raymond, NP.     Allergies:   Patient has no known allergies.   Social History   Tobacco Use   Smoking status: Former   Smokeless tobacco: Never  Scientific laboratory technician Use: Never used  Substance Use Topics   Alcohol use: Not Currently     Alcohol/week: 2.0 standard drinks    Types: 2 Cans of beer per week   Drug use: No     Family Hx: The patient's family history includes Heart failure in his mother; Mental illness in his sister and sister.  ROS:   Please see the history of present illness.     All other systems reviewed and are negative.  Prior CV studies:    The following studies were reviewed today:  Procedures This Admission:  Transeptal Puncture Intra-procedural TEE which showed no LAA thrombus Left atrial appendage occlusive device placement on 10/27/21 by Dr. Quentin Ore.    This study demonstrated: Conclusion: Successful implantation of a 27 mm Watchman FLX left atrial appendage occlusion device using fluoroscopic and transesophageal echo guidance   Labs/Other Tests and Data Reviewed:    EKG:  No ECG reviewed.  Recent Labs: 02/23/2021: NT-Pro BNP 249 04/17/2021: Magnesium 2.1 06/30/2021: ALT 17 07/20/2021: B Natriuretic Peptide 103.1 09/30/2021: TSH 2.340 10/28/2021: Hemoglobin 13.0; Platelets 229 11/04/2021: BUN 22; Creatinine, Ser 1.38; Potassium 4.7; Sodium 141   Recent Lipid Panel Lab Results  Component Value Date/Time   CHOL 139 11/04/2021 12:01 PM   TRIG 275 (H) 11/04/2021 12:01 PM   HDL 31 (L) 11/04/2021 12:01 PM   CHOLHDL 4.5 11/04/2021 12:01 PM   CHOLHDL 3.3 06/06/2021 02:44 PM   LDLCALC 64 11/04/2021 12:01 PM    Wt Readings from Last 3 Encounters:  11/22/21 (!) 315 lb (142.9 kg)  11/04/21 (!) 321 lb (145.6 kg)  11/01/21 (!) 318 lb (144.2 kg)        Objective:    Vital Signs:  There were no vitals taken for this visit.   VITAL SIGNS:  reviewed NEURO:  alert and oriented x 3  ASSESSMENT & PLAN:    Paroxsymal atrial fibrillation: Given his GI bleeding, he was referred to Dr. Quentin Ore to discuss LAAO implant. He was seen 09/21/21 at which time he was felt to be a good candidate for Watchman implant. He underwent CT scans 09/29/21 which showed left atrial appendage which was noted to be a  large chicken wing type without thrombus. A 31 mm watchman FLX device was recommended with RAO 15 CRA 7 deployment. He was admitted and underwent left atrial appendage occlusive device placement with 66mm FLX Watchman per Dr. Quentin Ore on 10/27/21. He had an issue with groin bleeding after the procedure at which time a Femstop was placed and monitored by cath lab staff. Groin stable and Level 0 at discharge. Medication plan will be Eliquis 5mg  BID monotherapy.  Plan is for EKG and labs tomorrow given missed in-person appointment today.   After careful review of history and examination, the risks and benefits of transesophageal echocardiogram have been explained including risks of esophageal damage, perforation (1:10,000 risk),  bleeding, pharyngeal hematoma as well as other potential complications associated with conscious sedation including aspiration, arrhythmia, respiratory failure and death. Alternatives to treatment were discussed, questions were answered. Patient is willing to proceed.   Time:   Today, I have spent 10 minutes with the patient with telehealth technology discussing the above problems.     Medication Adjustments/Labs and Tests Ordered: Current medicines are reviewed at length with the patient today.  Concerns regarding medicines are outlined above.   Tests Ordered: No orders of the defined types were placed in this encounter.   Medication Changes: No orders of the defined types were placed in this encounter.   Signed, Kathyrn Drown, NP  11/23/2021 4:02 PM    [facility]

## 2021-11-21 NOTE — Progress Notes (Addendum)
Virtual Visit via Telephone Note   This visit type was conducted due to national recommendations for restrictions regarding the COVID-19 Pandemic (e.g. social distancing) in an effort to limit this patient's exposure and mitigate transmission in our community.  Due to his co-morbid illnesses, this patient is at least at moderate risk for complications without adequate follow up.  This format is felt to be most appropriate for this patient at this time.  The patient did not have access to video technology/had technical difficulties with video requiring transitioning to audio format only (telephone).  All issues noted in this document were discussed and addressed.  No physical exam could be performed with this format.  Please refer to the patient's chart for his  consent to telehealth for [facility].   Date:  11/23/2021   ID:  [name], DOB [birth year], MRN [id number] The patient was identified using 2 identifiers.  Patient Location: Home Provider Location: Office/Clinic   PCP:  [name], NP   [facility] Providers Cardiologist:  [name], MD Electrophysiologist:  [name], MD  Advanced Heart Failure:  Loralie Champagne, MD     Evaluation Performed:  Follow-Up Visit  Chief Complaint:  1 month s/p Watchman implant   History of Present Illness:    [name] is a 60 y.o. male with a hx of chronic systolic CHF with an initial LVEF at 25-30% with diffuse hypokinesis, CAD s/p OM2 PCI 2012, DM2, paroxysmal atrial fibrillation intolerant to anticoagulation due to GI bleeding, and schizophrenia. He is followed extensively by the [facility] due to severe systolic CHF as above. Echo 09/2019 showed EF 25-30% with diffuse hypokinesis. LHC performed at that time which showed occluded OM2 at prior stent, 90% D1 stenosis, and extensive diffuse RCA disease with no intervention performed. He was thought to be in paroxysmal atrial fibrillation  and Eliquis was started. Repeat echo 5/21 showed EF 30% with diffuse hypokinesis, mild LVH, PASP 38, mildly decreased RV systolic function, IVC dilated. Biotronik ICD placed.    He was hospitalized in 06/16/21 with upper GI bleeding and CHF exacerbation. EGD showed duodenal AVMs, treated with APC. He was diuresed for CHF. Eliquis was held but restarted at d/c.     Given his GI bleeding, he was referred to Dr. Quentin Ore to discuss LAAO implant. He was seen 09/21/21 at which time he was felt to be a good candidate for Watchman implant. He underwent CT scans 09/29/21 which showed left atrial appendage which was noted to be a large chicken wing type without thrombus. A 31 mm watchman FLX device was recommended with RAO 15 CRA 7 deployment and was ultimately scheduled for LAAO closure with Watchman on 10/27/21.   The patient was admitted and underwent left atrial appendage occlusive device placement with 17mm FLX Watchman per Dr. Quentin Ore on 10/27/21. He had an issue with groin bleeding after the procedure at which time a Femstop was placed and monitored by cath lab staff. Groin stable and Level 0 at discharge. Medication plan will be Eliquis 5mg  BID monotherapy.    Today [name] forgot about his procedure however I was able to contact him by telephone. He has been doing great. We discussed the need to come into the office for labs and EKG tomorrow which he is agreeable to. He denies chest pain, palpitations, SOB, dizziness or syncope.   Past Medical History:  Diagnosis Date   Anxiety    Atrial fibrillation ([place])  CAD (coronary artery disease)    Cardiomyopathy (HCC)    CHF (congestive heart failure) ([place]) 09/2019   Depression    Diabetes mellitus    Erectile dysfunction 11/2019   GI bleeding    H/O right heart catheterization 09/2019   Hypertension    Presence of Watchman left atrial appendage closure device 10/27/2021   27 mm Watchman Flex Device per Dr. Quentin Ore   Schizophrenia [facility])    Sleep  apnea    uses cpap   Past Surgical History:  Procedure Laterality Date   BIOPSY  04/19/2021   Procedure: BIOPSY;  Surgeon: Doran Stabler, MD;  Location: [facility] ENDOSCOPY;  Service: Gastroenterology;;   COLONOSCOPY WITH PROPOFOL N/A 04/16/2021   Procedure: COLONOSCOPY WITH PROPOFOL;  Surgeon: [name], MD;  Location: [place];  Service: Gastroenterology;  Laterality: N/A;   CORONARY STENT PLACEMENT     ENTEROSCOPY N/A 04/08/2020   Procedure: ENTEROSCOPY;  Surgeon: Doran Stabler, MD;  Location: [place];  Service: Gastroenterology;  Laterality: N/A;   ENTEROSCOPY N/A 06/17/2020   Procedure: ENTEROSCOPY;  Surgeon: Irene Shipper, MD;  Location: [facility] ENDOSCOPY;  Service: Endoscopy;  Laterality: N/A;   ENTEROSCOPY N/A 04/15/2021   Procedure: ENTEROSCOPY;  Surgeon: [name], MD;  Location: [place];  Service: Gastroenterology;  Laterality: N/A;   ENTEROSCOPY N/A 04/19/2021   Procedure: ENTEROSCOPY;  Surgeon: Doran Stabler, MD;  Location: [place];  Service: Gastroenterology;  Laterality: N/A;   ENTEROSCOPY N/A 07/01/2021   Procedure: ENTEROSCOPY;  Surgeon: Jerene Bears, MD;  Location: [facility] ENDOSCOPY;  Service: Gastroenterology;  Laterality: N/A;   GIVENS CAPSULE STUDY  04/16/2021   Procedure: GIVENS CAPSULE STUDY;  Surgeon: [name], MD;  Location: [place];  Service: Gastroenterology;;   HEMOSTASIS CLIP PLACEMENT  04/08/2020   Procedure: HEMOSTASIS CLIP PLACEMENT;  Surgeon: Doran Stabler, MD;  Location: [place];  Service: Gastroenterology;;   HEMOSTASIS CLIP PLACEMENT  07/01/2021   Procedure: HEMOSTASIS CLIP PLACEMENT;  Surgeon: Jerene Bears, MD;  Location: [place];  Service: Gastroenterology;;   HEMOSTASIS CONTROL  04/08/2020   Procedure: HEMOSTASIS CONTROL;  Surgeon: Doran Stabler, MD;  Location[facility] ENDOSCOPY;  Service: Gastroenterology;;   HEMOSTASIS CONTROL  06/17/2020   Procedure: HEMOSTASIS CONTROL;  Surgeon: Irene Shipper,  MD;  Location: [place];  Service: Endoscopy;;   HERNIA REPAIR     HOT HEMOSTASIS N/A 04/15/2021   Procedure: HOT HEMOSTASIS (ARGON PLASMA COAGULATION/BICAP);  Surgeon: [name], MD;  Location: [place];  Service: Gastroenterology;  Laterality: N/A;   HOT HEMOSTASIS N/A 07/01/2021   Procedure: HOT HEMOSTASIS (ARGON PLASMA COAGULATION/BICAP);  Surgeon: Jerene Bears, MD;  Location: [facility] ENDOSCOPY;  Service: Gastroenterology;  Laterality: N/A;   ICD IMPLANT N/A 09/03/2020   Procedure: ICD IMPLANT;  Surgeon: [name], MD;  Location: [place] CV LAB;  Service: Cardiovascular;  Laterality: N/A;   LEFT ATRIAL APPENDAGE OCCLUSION N/A 10/27/2021   Procedure: LEFT ATRIAL APPENDAGE OCCLUSION;  Surgeon: [name], MD;  Location: [place] CV LAB;  Service: Cardiovascular;  Laterality: N/A;   POLYPECTOMY  04/16/2021   Procedure: POLYPECTOMY;  Surgeon: [name], MD;  Location: [facility] ENDOSCOPY;  Service: Gastroenterology;;   RIGHT/LEFT HEART CATH AND CORONARY ANGIOGRAPHY N/A 10/14/2019   Procedure: RIGHT/LEFT HEART CATH AND CORONARY ANGIOGRAPHY;  Surgeon: Belva Crome, MD;  Location: [place] CV LAB;  Service: Cardiovascular;  Laterality: N/A;   SUBMUCOSAL TATTOO INJECTION  04/19/2021   Procedure:  SUBMUCOSAL TATTOO INJECTION;  Surgeon: Doran Stabler, MD;  Location: [name];  Service: Gastroenterology;;   TEE WITHOUT CARDIOVERSION N/A 10/27/2021   Procedure: TRANSESOPHAGEAL ECHOCARDIOGRAM (TEE);  Surgeon: [name], MD;  Location: [place] CV LAB;  Service: Cardiovascular;  Laterality: N/A;     No outpatient medications have been marked as taking for the 11/23/21 encounter (Office Visit) with Tommie Raymond, NP.     Allergies:   Patient has no known allergies.   Social History   Tobacco Use   Smoking status: Former   Smokeless tobacco: Never  Scientific laboratory technician Use: Never used  Substance Use Topics   Alcohol use: Not Currently     Alcohol/week: 2.0 standard drinks    Types: 2 Cans of beer per week   Drug use: No     Family Hx: The patient's family history includes Heart failure in his mother; Mental illness in his sister and sister.  ROS:   Please see the history of present illness.     All other systems reviewed and are negative.  Prior CV studies:    The following studies were reviewed today:  Procedures This Admission:  Transeptal Puncture Intra-procedural TEE which showed no LAA thrombus Left atrial appendage occlusive device placement on 10/27/21 by Dr. Quentin Ore.    This study demonstrated: Conclusion: Successful implantation of a 27 mm Watchman FLX left atrial appendage occlusion device using fluoroscopic and transesophageal echo guidance   Labs/Other Tests and Data Reviewed:    EKG:  No ECG reviewed.  Recent Labs: 02/23/2021: NT-Pro BNP 249 04/17/2021: Magnesium 2.1 06/30/2021: ALT 17 07/20/2021: B Natriuretic Peptide 103.1 09/30/2021: TSH 2.340 10/28/2021: Hemoglobin 13.0; Platelets 229 11/04/2021: BUN 22; Creatinine, Ser 1.38; Potassium 4.7; Sodium 141   Recent Lipid Panel Lab Results  Component Value Date/Time   CHOL 139 11/04/2021 12:01 PM   TRIG 275 (H) 11/04/2021 12:01 PM   HDL 31 (L) 11/04/2021 12:01 PM   CHOLHDL 4.5 11/04/2021 12:01 PM   CHOLHDL 3.3 06/06/2021 02:44 PM   LDLCALC 64 11/04/2021 12:01 PM    Wt Readings from Last 3 Encounters:  11/22/21 (!) 315 lb (142.9 kg)  11/04/21 (!) 321 lb (145.6 kg)  11/01/21 (!) 318 lb (144.2 kg)        Objective:    Vital Signs:  There were no vitals taken for this visit.   VITAL SIGNS:  reviewed NEURO:  alert and oriented x 3  ASSESSMENT & PLAN:    Paroxsymal atrial fibrillation: Given his GI bleeding, he was referred to Dr. Quentin Ore to discuss LAAO implant. He was seen 09/21/21 at which time he was felt to be a good candidate for Watchman implant. He underwent CT scans 09/29/21 which showed left atrial appendage which was noted to be a  large chicken wing type without thrombus. A 31 mm watchman FLX device was recommended with RAO 15 CRA 7 deployment. He was admitted and underwent left atrial appendage occlusive device placement with 77mm FLX Watchman per Dr. Quentin Ore on 10/27/21. He had an issue with groin bleeding after the procedure at which time a Femstop was placed and monitored by cath lab staff. Groin stable and Level 0 at discharge. Medication plan will be Eliquis 5mg  BID monotherapy.  Plan is for EKG and labs tomorrow given missed in-person appointment today.   After careful review of history and examination, the risks and benefits of transesophageal echocardiogram have been explained including risks of esophageal damage, perforation (1:10,000 risk),  bleeding, pharyngeal hematoma as well as other potential complications associated with conscious sedation including aspiration, arrhythmia, respiratory failure and death. Alternatives to treatment were discussed, questions were answered. Patient is willing to proceed.   Time:   Today, I have spent 10 minutes with the patient with telehealth technology discussing the above problems.     Medication Adjustments/Labs and Tests Ordered: Current medicines are reviewed at length with the patient today.  Concerns regarding medicines are outlined above.   Tests Ordered: No orders of the defined types were placed in this encounter.   Medication Changes: No orders of the defined types were placed in this encounter.   Signed, Kathyrn Drown, NP  11/23/2021 4:02 PM    [facility]

## 2021-11-22 ENCOUNTER — Other Ambulatory Visit (HOSPITAL_COMMUNITY): Payer: Self-pay

## 2021-11-22 ENCOUNTER — Other Ambulatory Visit: Payer: Self-pay

## 2021-11-22 NOTE — Progress Notes (Signed)
Paramedicine Encounter    Patient ID: [name], male    DOB: 1961-09-04, 60 y.o.   MRN: [id number]   Arrived for home visit for [name] who reports to be feeling good with no complaints reporting he has felt great. [name] was compliant with medications over the last week.   I reviewed medications and filled two pill boxes. (Second box needs 1/2 tab lasix)  Assessment and vitals obtained.  No swelling, no JVD, abdomen soft. Lungs clear.   We reviewed appointments and confirmed same. [name] inquiring about E.D. concerns and wanting to talk to PCP about referral to doctor for this. I will message PCP about same.   [name] also wants to cancel [facility] foot and ankle appointment I will do so.   Refills: Lasix Ferrous Sulfate Seroquel   I will see [name] in one week for med rec.     Patient Care Team: [name], NP as PCP - General (Adult Health Nurse Practitioner) [name], MD as PCP - Cardiology (Cardiology) [name], MD as PCP - Advanced Heart Failure (Cardiology) [name], MD as PCP - Electrophysiology (Cardiology) [name], LCSW as Social Worker (Licensed Clinical Social Worker) [name], MD as Consulting Physician (Gastroenterology)  Patient Active Problem List   Diagnosis Date Noted   Presence of Watchman left atrial appendage closure device 10/28/2021   Atrial fibrillation ([place]) 10/27/2021   Melena    Chronic diastolic CHF (congestive heart failure) ([place])    GI bleed 06/29/2021   Severe anemia 06/29/2021   Adenomatous polyp of ascending colon    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   Acute blood loss anemia    Angiodysplasia of stomach    Chronic anticoagulation    CHF exacerbation ([place]) 06/15/2020   AKI (acute kidney injury) ([place]) 06/15/2020   CKD (chronic kidney disease), stage III ([place]) 06/15/2020   Anemia due to chronic blood loss    Gastric AVM    Angiodysplasia of duodenum with  hemorrhage    Acute on chronic systolic (congestive) heart failure ([place]) 04/05/2020   Anemia 04/05/2020   PAF (paroxysmal atrial fibrillation) ([place]) 04/05/2020   OSA on CPAP 04/05/2020   Syncope and collapse 04/05/2020   Type 2 diabetes mellitus with hyperglycemia, with long-term current use of insulin ([place]) 12/03/2019   Diabetic polyneuropathy associated with type 2 diabetes mellitus ([place]) 12/03/2019   Erectile dysfunction 12/03/2019   Paranoid schizophrenia, chronic condition ([place]) 01/26/2015   Severe recurrent major depressive disorder with psychotic features ([place]) 01/26/2015   GAD (generalized anxiety disorder) 01/26/2015   OCD (obsessive compulsive disorder) 01/26/2015   Panic disorder without agoraphobia 01/26/2015   Insomnia 01/26/2015    Current Outpatient Medications:    acetaminophen (TYLENOL) 500 MG tablet, Take 2 tablets (1,000 mg total) by mouth 2 (two) times daily as needed. (Patient taking differently: Take 1,000 mg by mouth in the morning and at bedtime.), Disp: 180 tablet, Rfl: 3   albuterol (VENTOLIN HFA) 108 (90 Base) MCG/ACT inhaler, Inhale 2 puffs into the lungs every 6 (six) hours as needed for wheezing or shortness of breath., Disp: 18 g, Rfl: 3   amoxicillin (AMOXIL) 500 MG capsule, Take 4 capsules (2,000 mg total) by mouth as directed. Take 4 tablets 1 hour prior to dental work, including cleanings. (Patient not taking: Reported on 11/15/2021), Disp: 12 capsule, Rfl: 12   apixaban (ELIQUIS) 5 MG TABS tablet, Take 1 tablet (5 mg total) by mouth 2 (two) times daily., Disp:  180 tablet, Rfl: 3   atorvastatin (LIPITOR) 40 MG tablet, Take 1 tablet (40 mg total) by mouth daily at 6 PM., Disp: 90 tablet, Rfl: 1   blood glucose meter kit and supplies KIT, Dispense based on patient and insurance preference. Use up to four times daily as directed. (FOR ICD-9 250.00, 250.01)., Disp: 1 each, Rfl: 0   carvedilol (COREG) 6.25 MG tablet, Take 1 tablet (6.25 mg total) by mouth 2 (two)  times daily with a meal., Disp: 60 tablet, Rfl: 3   Continuous Blood Gluc Receiver (FREESTYLE LIBRE 14 DAY READER) DEVI, 1 each by Does not apply route as needed., Disp: 1 each, Rfl: 11   Continuous Blood Gluc Sensor (FREESTYLE LIBRE 14 DAY SENSOR) MISC, Use one applicator every 14 (fourteen) days., Disp: 6 each, Rfl: 3   cyclobenzaprine (FLEXERIL) 10 MG tablet, Take 10 mg by mouth 3 (three) times daily as needed for muscle spasms., Disp: , Rfl:    dapagliflozin propanediol (FARXIGA) 10 MG TABS tablet, Take 1 tablet (10 mg total) by mouth daily before breakfast., Disp: 30 tablet, Rfl: 11   doxycycline (VIBRAMYCIN) 100 MG capsule, Take 1 capsule (100 mg total) by mouth daily for 10 days. (Patient not taking: Reported on 11/15/2021), Disp: 10 capsule, Rfl: 0   ferrous sulfate 325 (65 FE) MG tablet, Take 1 tablet (325 mg total) by mouth every other day. (Patient taking differently: Take 325 mg by mouth every Monday, Wednesday, and Friday.), Disp: 30 tablet, Rfl: 0   furosemide (LASIX) 40 MG tablet, Take 1.5 tablets (60 mg total) by mouth 2 (two) times daily., Disp: 90 tablet, Rfl: 3   gabapentin (NEURONTIN) 800 MG tablet, Take 1 tablet (800 mg total) by mouth every 8 (eight) hours. (Patient taking differently: Take 800 mg by mouth 2 (two) times daily.), Disp: 90 tablet, Rfl: 11   glipiZIDE (GLUCOTROL) 10 MG tablet, TAKE 1 TABLET (10 MG TOTAL) BY MOUTH 2 (TWO) TIMES DAILY BEFORE A MEAL., Disp: 60 tablet, Rfl: 6   insulin glargine (LANTUS) 100 UNIT/ML injection, Inject 0.2 mLs (20 Units total) into the skin daily., Disp: 20 mL, Rfl: 1   Insulin Lispro Prot & Lispro (HUMALOG MIX 75/25 KWIKPEN) (75-25) 100 UNIT/ML Kwikpen, Inject 30 Units into the skin 2 (two) times daily., Disp: 15 mL, Rfl: 11   Insulin Pen Needle (TRUEPLUS 5-BEVEL PEN NEEDLES) 31G X 6 MM MISC, See admin instructions., Disp: 100 each, Rfl: 0   Insulin Syringe-Needle U-100 (TRUEPLUS INSULIN SYRINGE) 31G X 5/16" 0.3 ML MISC, USE 3 TIMES DAILY  TO INJECT INSULIN, Disp: 300 each, Rfl: 3   metFORMIN (GLUCOPHAGE) 500 MG tablet, TAKE 1 TABLET (500 MG TOTAL) BY MOUTH 2 (TWO) TIMES DAILY WITH A MEAL., Disp: 60 tablet, Rfl: 6   Multiple Vitamins-Minerals (CENTRUM SILVER 50+MEN) TABS, Take 1 tablet by mouth daily., Disp: , Rfl:    pantoprazole (PROTONIX) 40 MG tablet, Take 1 tablet (40 mg total) by mouth daily., Disp: 90 tablet, Rfl: 3   potassium chloride SA (KLOR-CON M) 20 MEQ tablet, Take 2 tablets (40 mEq total) by mouth daily., Disp: 60 tablet, Rfl: 3   QUEtiapine (SEROQUEL) 300 MG tablet, Take 1.5 tab by mouth daily, Disp: 45 tablet, Rfl: 0   sacubitril-valsartan (ENTRESTO) 24-26 MG, Take 1 tablet by mouth 2 (two) times daily., Disp: 180 tablet, Rfl: 3   spironolactone (ALDACTONE) 25 MG tablet, Take 0.5 tablets (12.5 mg total) by mouth at bedtime., Disp: 45 tablet, Rfl: 3   TRUEPLUS PEN  NEEDLES 31G X 6 MM MISC, USE AS DIRECTED, Disp: 100 each, Rfl: 0 No Known Allergies   Social History   Socioeconomic History   Marital status: Legally Separated    Spouse name: Not on file   Number of children: Not on file   Years of education: Not on file   Highest education level: Not on file  Occupational History   Not on file  Tobacco Use   Smoking status: Former   Smokeless tobacco: Never  Vaping Use   Vaping Use: Never used  Substance and Sexual Activity   Alcohol use: Not Currently    Alcohol/week: 2.0 standard drinks    Types: 2 Cans of beer per week   Drug use: No   Sexual activity: Yes  Other Topics Concern   Not on file  Social History Narrative   Not on file   Social Determinants of Health   Financial Resource Strain: Not on file  Food Insecurity: No Food Insecurity   Worried About Charity fundraiser in the Last Year: Never true   Ran Out of Food in the Last Year: Never true  Transportation Needs: Unmet Transportation Needs   Lack of Transportation (Medical): Yes   Lack of Transportation (Non-Medical): Yes  Physical  Activity: Not on file  Stress: Not on file  Social Connections: Not on file  Intimate Partner Violence: Not on file    Physical Exam Vitals reviewed.  Constitutional:      Appearance: Normal appearance. He is normal weight.  HENT:     Head: Normocephalic.     Mouth/Throat:     Mouth: Mucous membranes are moist.     Pharynx: Oropharynx is clear.  Eyes:     Conjunctiva/sclera: Conjunctivae normal.     Pupils: Pupils are equal, round, and reactive to light.  Cardiovascular:     Rate and Rhythm: Normal rate and regular rhythm.     Pulses: Normal pulses.     Heart sounds: Normal heart sounds.  Abdominal:     General: Abdomen is flat.     Palpations: Abdomen is soft.  Musculoskeletal:        General: No swelling. Normal range of motion.     Cervical back: Normal range of motion.     Right lower leg: No edema.     Left lower leg: No edema.  Skin:    General: Skin is warm and dry.     Capillary Refill: Capillary refill takes less than 2 seconds.  Neurological:     General: No focal deficit present.     Mental Status: He is alert. Mental status is at baseline.  Psychiatric:        Mood and Affect: Mood normal.        Future Appointments  Date Time Provider [place]  11/23/2021  3:30 PM Tommie Raymond, NP [facility]  11/30/2021  4:00 PM Lorenda Peck, MD [facility]  12/02/2021  7:40 AM [facility] DEVICE REMOTES [facility]  12/12/2021 11:30 AM [facility] PA/NP [facility] None  02/02/2022 11:00 AM [name], NP [facility] None  03/03/2022  7:40 AM [facility] DEVICE REMOTES [facility]  03/30/2022  1:20 PM [name], NP [facility] None     ACTION: Home visit completed

## 2021-11-23 ENCOUNTER — Other Ambulatory Visit: Payer: Self-pay

## 2021-11-23 ENCOUNTER — Ambulatory Visit (INDEPENDENT_AMBULATORY_CARE_PROVIDER_SITE_OTHER): Payer: Medicare (Managed Care) | Admitting: Cardiology

## 2021-11-23 ENCOUNTER — Ambulatory Visit: Payer: Medicare (Managed Care) | Admitting: Podiatry

## 2021-11-23 DIAGNOSIS — I480 Paroxysmal atrial fibrillation: Secondary | ICD-10-CM | POA: Diagnosis not present

## 2021-11-23 DIAGNOSIS — Z95818 Presence of other cardiac implants and grafts: Secondary | ICD-10-CM

## 2021-11-23 MED ORDER — QUETIAPINE FUMARATE 300 MG PO TABS
ORAL_TABLET | ORAL | 0 refills | Status: DC
Start: 2021-11-23 — End: 2021-12-23
  Filled 2021-11-23: qty 45, 30d supply, fill #0

## 2021-11-23 NOTE — Patient Instructions (Signed)
Medication Instructions:  Your physician recommends that you continue on your current medications as directed. Please refer to the Current Medication list given to you today.  *If you need a refill on your cardiac medications before your next appointment, please call your pharmacy*   Lab Work: TO BE DONE ON 12/29: BMET, CBC If you have labs (blood work) drawn today and your tests are completely normal, you will receive your results only by: [name] (if you have MyChart) OR A paper copy in the mail If you have any lab test that is abnormal or we need to change your treatment, we will call you to review the results.   Testing/Procedures: Your physician has requested that you have a TEE. During a TEE, sound waves are used to create images of your heart. It provides your doctor with information about the size and shape of your heart and how well your hearts chambers and valves are working. In this test, a transducer is attached to the end of a flexible tube thats guided down your throat and into your esophagus (the tube leading from you mouth to your stomach) to get a more detailed image of your heart. You are not awake for the procedure. Please see the instruction sheet given to you today. For further information please visit [email or web address].   Follow-Up: At [facility], you and your health needs are our priority.  As part of our continuing mission to provide you with exceptional heart care, we have created designated Provider Care Teams.  These Care Teams include your primary Cardiologist (physician) and Advanced Practice Providers (APPs -  Physician Assistants and Nurse Practitioners) who all work together to provide you with the care you need, when you need it.  We recommend signing up for the patient portal called "MyChart".  Sign up information is provided on this After Visit Summary.  MyChart is used to connect with patients for Virtual Visits (Telemedicine).  Patients are able  to view lab/test results, encounter notes, upcoming appointments, etc.  Non-urgent messages can be sent to your provider as well.   To learn more about what you can do with MyChart, go to [email or web address].    Your next appointment:   STRUCTURAL TEAM WILL CALL TO SCHEDULE A FOLLOW-UP   Other Instructions  You are scheduled for a TEE/Cardioversion/TEE Cardioversion on 12/12/21 with Dr. Audie Box.  Please arrive at the [facility] (Main Entrance A) at [facility]: [address] [place], [place] [address] at 10 am. (1 hour prior to procedure unless lab work is needed; if lab work is needed arrive 1.5 hours ahead)  DIET: Nothing to eat or drink after midnight except a sip of water with medications (see medication instructions below)  FYI: For your safety, and to allow [place] to monitor your vital signs accurately during the surgery/procedure we request that   if you have artificial nails, gel coating, SNS etc. Please have those removed prior to your surgery/procedure. Not having the nail coverings /polish removed may result in cancellation or delay of your surgery/procedure.   Medication Instructions:   Continue your anticoagulant: ELIQUIS You will need to continue your anticoagulant after your procedure until you  are told by your  Provider that it is safe to stop  Bring your insurance cards.  *Special Note: Every effort is made to have your procedure done on time. Occasionally there are emergencies that occur at the hospital that may cause delays. Please be patient if a delay does occur.

## 2021-11-24 ENCOUNTER — Other Ambulatory Visit: Payer: Medicare (Managed Care) | Admitting: *Deleted

## 2021-11-24 ENCOUNTER — Other Ambulatory Visit: Payer: Self-pay

## 2021-11-24 ENCOUNTER — Ambulatory Visit (INDEPENDENT_AMBULATORY_CARE_PROVIDER_SITE_OTHER): Payer: Medicare (Managed Care) | Admitting: *Deleted

## 2021-11-24 VITALS — BP 110/72 | HR 88 | Ht 74.0 in | Wt 322.0 lb

## 2021-11-24 DIAGNOSIS — I480 Paroxysmal atrial fibrillation: Secondary | ICD-10-CM | POA: Diagnosis not present

## 2021-11-24 DIAGNOSIS — Z95818 Presence of other cardiac implants and grafts: Secondary | ICD-10-CM | POA: Diagnosis not present

## 2021-11-24 NOTE — Patient Instructions (Signed)
Medication Instructions:   Your physician recommends that you continue on your current medications as directed. Please refer to the Current Medication list given to you today.  *If you need a refill on your cardiac medications before your next appointment, please call your pharmacy*   Lab Work:  PRE-PROCEDURE LABS--COMPLETED TODAY AS ORDERED BY JILL MCDANIEL NP  If you have labs (blood work) drawn today and your tests are completely normal, you will receive your results only by: [place] (if you have MyChart) OR A paper copy in the mail If you have any lab test that is abnormal or we need to change your treatment, we will call you to review the results.   Testing/Procedures:  TEE- AS SCHEDULED FOR 12/12/21 AT 10 AM WITH DR. O'NEAL TO DO AT [place]--PLEASE FOLLOW TEE INSTRUCTIONS PROVIDED TO YOU AT [facility] VIRTUAL VISIT APPOINTMENT WITH JILL MCDANIEL NP   Follow-Up:  STRUCTURAL TEAM WILL CALL TO SCHEDULE A FOLLOW-UP

## 2021-11-24 NOTE — Progress Notes (Signed)
Reason for visit: EKG visit per Kathyrn Drown NP from virtual visit done on pt yesterday 12/28, and needed for upcoming TEE on 12/12/21  Name of MD requesting visit: Kathyrn Drown NP/Dr. Quentin Ore  H&P:  Paroxsymal atrial fibrillation: Given his GI bleeding, he was referred to Dr. Quentin Ore to discuss LAAO implant. He was seen 09/21/21 at which time he was felt to be a good candidate for Watchman implant. He underwent CT scans 09/29/21 which showed left atrial appendage which was noted to be a large chicken wing type without thrombus. A 31 mm watchman FLX device was recommended with RAO 15 CRA 7 deployment. He was admitted and underwent left atrial appendage occlusive device placement with 9mm FLX Watchman per Dr. Quentin Ore on 10/27/21. He had an issue with groin bleeding after the procedure at which time a Femstop was placed and monitored by cath lab staff. Groin stable and Level 0 at discharge. Medication plan will be Eliquis 5mg  BID monotherapy.  Plan is for EKG and labs tomorrow given missed in-person appointment today.    ROS related to problem: pt here for EKG nurse visit as requested by Kathyrn Drown NP from virtual visit on pt 12/28.  Pt had Watchman procedure on 10/27/21.  EKG needed for upcoming TEE on 12/12/21 for Dr. Audie Box to do. EKG done today and showed NSR HR-88.  EKG showed to DOD Dr. Quentin Ore and signed. EKG placed in med rec box to be scanned into the pts chart. Per Dr. Quentin Ore, pt should proceed with scheduled TEE on 12/12/21 at 11 am for Dr. Audie Box to do.  Pt aware to proceed as planned.  He was provided TEE instructions via virtual visit yesterday, and understood all instructions and arrival time for TEE on 12/12/21.  He had his pre-procedure labs done today in while in the office as well.    Assessment and plan per MD: Proceed as scheduled with TEE on 12/12/21 at 11 am for Dr. Audie Box to do.  Pt aware to arrive at 10 am.  He has TEE instructions as provided to him at virtual visit yesterday.  Pt is  aware that Structural Team will reach out to him soon to arrange his follow-up appt with them.

## 2021-11-25 ENCOUNTER — Other Ambulatory Visit: Payer: Self-pay

## 2021-11-25 LAB — BASIC METABOLIC PANEL
BUN/Creatinine Ratio: 24 (ref 10–24)
BUN: 27 mg/dL (ref 8–27)
CO2: 27 mmol/L (ref 20–29)
Calcium: 9.4 mg/dL (ref 8.6–10.2)
Chloride: 92 mmol/L — ABNORMAL LOW (ref 96–106)
Creatinine, Ser: 1.13 mg/dL (ref 0.76–1.27)
Glucose: 338 mg/dL — ABNORMAL HIGH (ref 70–99)
Potassium: 4.7 mmol/L (ref 3.5–5.2)
Sodium: 131 mmol/L — ABNORMAL LOW (ref 134–144)
eGFR: 74 mL/min/{1.73_m2} (ref >59)

## 2021-11-25 LAB — CBC
Hematocrit: 43.4 % (ref 37.5–51.0)
Hemoglobin: 13.8 g/dL (ref 13.0–17.7)
MCH: 25.8 pg — ABNORMAL LOW (ref 26.6–33.0)
MCHC: 31.8 g/dL (ref 31.5–35.7)
MCV: 81 fL (ref 79–97)
Platelets: 245 10*3/uL (ref 150–450)
RBC: 5.35 x10E6/uL (ref 4.14–5.80)
RDW: 14.6 % (ref 11.6–15.4)
WBC: 6.2 10*3/uL (ref 3.4–10.8)

## 2021-11-29 ENCOUNTER — Other Ambulatory Visit (HOSPITAL_COMMUNITY): Payer: Self-pay

## 2021-11-29 NOTE — Progress Notes (Signed)
Paramedicine Encounter    Patient ID: [name], male    DOB: 1961/05/23, 61 y.o.   MRN: [id number]  Arrived for med rec for [name] who reports feeling good. He was compliant with medication over the last week. I had already filled second pill box last week but it needed 1/2 tab lasix Sunday and Monday. I placed same in pill box and confirmed upcoming appointments with [name].    I also spoke to [place] team to review his medications for his upcoming procedure at 4:05.   He is needing APP HF clinic appointment on 1/16 rescheduled due to interference with procedure. I will get this switched.   Visit complete.     ACTION: Home visit completed

## 2021-11-30 ENCOUNTER — Ambulatory Visit (INDEPENDENT_AMBULATORY_CARE_PROVIDER_SITE_OTHER): Payer: Self-pay | Admitting: Podiatry

## 2021-11-30 DIAGNOSIS — Z91199 Patient's noncompliance with other medical treatment and regimen due to unspecified reason: Secondary | ICD-10-CM

## 2021-11-30 LAB — CUP PACEART REMOTE DEVICE CHECK
Battery Voltage: 3.1 V
Brady Statistic RV Percent Paced: 0 %
Date Time Interrogation Session: 20230104081918
HighPow Impedance: 86 Ohm
Implantable Lead Implant Date: 20211008
Implantable Lead Location: 753860
Implantable Lead Model: 436910
Implantable Lead Serial Number: 81404997
Implantable Pulse Generator Implant Date: 20211008
Lead Channel Impedance Value: 481 Ohm
Lead Channel Pacing Threshold Amplitude: 0.4 V
Lead Channel Pacing Threshold Pulse Width: 0.4 ms
Lead Channel Sensing Intrinsic Amplitude: 19.7 mV
Lead Channel Sensing Intrinsic Amplitude: 2.3 mV
Lead Channel Setting Pacing Amplitude: 2 V
Lead Channel Setting Pacing Pulse Width: 0.4 ms
Lead Channel Setting Sensing Sensitivity: 0.8 mV
Pulse Gen Model: 429525
Pulse Gen Serial Number: 84810752

## 2021-11-30 NOTE — Progress Notes (Signed)
No show

## 2021-12-01 ENCOUNTER — Encounter (HOSPITAL_COMMUNITY): Payer: Self-pay | Admitting: Cardiovascular Disease

## 2021-12-01 NOTE — Progress Notes (Signed)
Attempted to obtain medical history via telephone, unable to reach at this time. I left a voicemail to return pre surgical testing department's phone call.  

## 2021-12-02 ENCOUNTER — Ambulatory Visit (INDEPENDENT_AMBULATORY_CARE_PROVIDER_SITE_OTHER): Payer: Medicare Other

## 2021-12-02 DIAGNOSIS — I5032 Chronic diastolic (congestive) heart failure: Secondary | ICD-10-CM

## 2021-12-06 ENCOUNTER — Other Ambulatory Visit (HOSPITAL_COMMUNITY): Payer: Self-pay

## 2021-12-06 NOTE — Progress Notes (Signed)
Paramedicine Encounter    Patient ID: [name], male    DOB: 28-Jun-1961, 61 y.o.   MRN: [id number]  Arrived for home visit for [name] who reports to be feeling good with no complaints. He has been med compliant over the last week. He discussed at length his upcoming procedure and reviewed instructions or same.   I obtained vitals and assessment. No lower leg swelling noted. Lungs clear. Vitals within normal limits.   Meds reviewed and pill box filled accordingly.   Needs:  Jardiance Ferrous Sulfate   CBG- 328 (after a meal, no insulin today) We discussed diabetic management.   Appointments reviewed. Home visit complete.     Patient Care Team: [name], NP as PCP - General (Adult Health Nurse Practitioner) [name], MD as PCP - Cardiology (Cardiology) [name], MD as PCP - Advanced Heart Failure (Cardiology) [name], MD as PCP - Electrophysiology (Cardiology) [name], LCSW as Social Worker (Licensed Clinical Social Worker) [name], MD as Consulting Physician (Gastroenterology)  Patient Active Problem List   Diagnosis Date Noted   Presence of Watchman left atrial appendage closure device 10/28/2021   Atrial fibrillation ([place]) 10/27/2021   Melena    Chronic diastolic CHF (congestive heart failure) ([place])    GI bleed 06/29/2021   Severe anemia 06/29/2021   Adenomatous polyp of ascending colon    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   Acute blood loss anemia    Angiodysplasia of stomach    Chronic anticoagulation    CHF exacerbation ([place]) 06/15/2020   AKI (acute kidney injury) ([place]) 06/15/2020   CKD (chronic kidney disease), stage III ([place]) 06/15/2020   Anemia due to chronic blood loss    Gastric AVM    Angiodysplasia of duodenum with hemorrhage    Acute on chronic systolic (congestive) heart failure ([place]) 04/05/2020   Anemia 04/05/2020   PAF (paroxysmal atrial fibrillation) ([place])  04/05/2020   OSA on CPAP 04/05/2020   Syncope and collapse 04/05/2020   Type 2 diabetes mellitus with hyperglycemia, with long-term current use of insulin ([place]) 12/03/2019   Diabetic polyneuropathy associated with type 2 diabetes mellitus ([place]) 12/03/2019   Erectile dysfunction 12/03/2019   Paranoid schizophrenia, chronic condition ([place]) 01/26/2015   Severe recurrent major depressive disorder with psychotic features ([place]) 01/26/2015   GAD (generalized anxiety disorder) 01/26/2015   OCD (obsessive compulsive disorder) 01/26/2015   Panic disorder without agoraphobia 01/26/2015   Insomnia 01/26/2015    Current Outpatient Medications:    acetaminophen (TYLENOL) 500 MG tablet, Take 2 tablets (1,000 mg total) by mouth 2 (two) times daily as needed. (Patient taking differently: Take 1,000 mg by mouth in the morning and at bedtime.), Disp: 180 tablet, Rfl: 3   albuterol (VENTOLIN HFA) 108 (90 Base) MCG/ACT inhaler, Inhale 2 puffs into the lungs every 6 (six) hours as needed for wheezing or shortness of breath., Disp: 18 g, Rfl: 3   amoxicillin (AMOXIL) 500 MG capsule, Take 4 capsules (2,000 mg total) by mouth as directed. Take 4 tablets 1 hour prior to dental work, including cleanings., Disp: 12 capsule, Rfl: 12   apixaban (ELIQUIS) 5 MG TABS tablet, Take 1 tablet (5 mg total) by mouth 2 (two) times daily., Disp: 180 tablet, Rfl: 3   atorvastatin (LIPITOR) 40 MG tablet, Take 1 tablet (40 mg total) by mouth daily at 6 PM., Disp: 90 tablet, Rfl: 1   blood glucose meter kit and supplies KIT, Dispense based on patient  and insurance preference. Use up to four times daily as directed. (FOR ICD-9 250.00, 250.01)., Disp: 1 each, Rfl: 0   carvedilol (COREG) 6.25 MG tablet, Take 1 tablet (6.25 mg total) by mouth 2 (two) times daily with a meal., Disp: 60 tablet, Rfl: 3   Continuous Blood Gluc Receiver (FREESTYLE LIBRE 14 DAY READER) DEVI, 1 each by Does not apply route as needed., Disp: 1 each, Rfl: 11    Continuous Blood Gluc Sensor (FREESTYLE LIBRE 14 DAY SENSOR) MISC, Use one applicator every 14 (fourteen) days., Disp: 6 each, Rfl: 3   cyclobenzaprine (FLEXERIL) 10 MG tablet, Take 10 mg by mouth 3 (three) times daily as needed for muscle spasms., Disp: , Rfl:    dapagliflozin propanediol (FARXIGA) 10 MG TABS tablet, Take 1 tablet (10 mg total) by mouth daily before breakfast., Disp: 30 tablet, Rfl: 11   ferrous sulfate 325 (65 FE) MG tablet, Take 1 tablet (325 mg total) by mouth every other day. (Patient taking differently: Take 325 mg by mouth every Monday, Wednesday, and Friday.), Disp: 30 tablet, Rfl: 0   furosemide (LASIX) 40 MG tablet, Take 1.5 tablets (60 mg total) by mouth 2 (two) times daily., Disp: 90 tablet, Rfl: 3   gabapentin (NEURONTIN) 800 MG tablet, Take 1 tablet (800 mg total) by mouth every 8 (eight) hours. (Patient taking differently: Take 800 mg by mouth 2 (two) times daily.), Disp: 90 tablet, Rfl: 11   glipiZIDE (GLUCOTROL) 10 MG tablet, TAKE 1 TABLET (10 MG TOTAL) BY MOUTH 2 (TWO) TIMES DAILY BEFORE A MEAL., Disp: 60 tablet, Rfl: 6   insulin glargine (LANTUS) 100 UNIT/ML injection, Inject 0.2 mLs (20 Units total) into the skin daily., Disp: 20 mL, Rfl: 1   Insulin Lispro Prot & Lispro (HUMALOG MIX 75/25 KWIKPEN) (75-25) 100 UNIT/ML Kwikpen, Inject 30 Units into the skin 2 (two) times daily., Disp: 15 mL, Rfl: 11   Insulin Pen Needle (TRUEPLUS 5-BEVEL PEN NEEDLES) 31G X 6 MM MISC, See admin instructions., Disp: 100 each, Rfl: 0   Insulin Syringe-Needle U-100 (TRUEPLUS INSULIN SYRINGE) 31G X 5/16" 0.3 ML MISC, USE 3 TIMES DAILY TO INJECT INSULIN, Disp: 300 each, Rfl: 3   metFORMIN (GLUCOPHAGE) 500 MG tablet, TAKE 1 TABLET (500 MG TOTAL) BY MOUTH 2 (TWO) TIMES DAILY WITH A MEAL., Disp: 60 tablet, Rfl: 6   Multiple Vitamins-Minerals (CENTRUM SILVER 50+MEN) TABS, Take 1 tablet by mouth daily., Disp: , Rfl:    pantoprazole (PROTONIX) 40 MG tablet, Take 1 tablet (40 mg total) by mouth  daily., Disp: 90 tablet, Rfl: 3   potassium chloride SA (KLOR-CON M) 20 MEQ tablet, Take 2 tablets (40 mEq total) by mouth daily., Disp: 60 tablet, Rfl: 3   QUEtiapine (SEROQUEL) 300 MG tablet, take 1.5 tab by mouth daily, Disp: 45 tablet, Rfl: 0   sacubitril-valsartan (ENTRESTO) 24-26 MG, Take 1 tablet by mouth 2 (two) times daily., Disp: 180 tablet, Rfl: 3   spironolactone (ALDACTONE) 25 MG tablet, Take 0.5 tablets (12.5 mg total) by mouth at bedtime., Disp: 45 tablet, Rfl: 3   TRUEPLUS PEN NEEDLES 31G X 6 MM MISC, USE AS DIRECTED, Disp: 100 each, Rfl: 0 No Known Allergies   Social History   Socioeconomic History   Marital status: Legally Separated    Spouse name: Not on file   Number of children: Not on file   Years of education: Not on file   Highest education level: Not on file  Occupational History   Not on file  Tobacco Use   Smoking status: Former   Smokeless tobacco: Never  Vaping Use   Vaping Use: Never used  Substance and Sexual Activity   Alcohol use: Not Currently    Alcohol/week: 2.0 standard drinks    Types: 2 Cans of beer per week   Drug use: No   Sexual activity: Yes  Other Topics Concern   Not on file  Social History Narrative   Not on file   Social Determinants of Health   Financial Resource Strain: Not on file  Food Insecurity: No Food Insecurity   Worried About Charity fundraiser in the Last Year: Never true   Ran Out of Food in the Last Year: Never true  Transportation Needs: Unmet Transportation Needs   Lack of Transportation (Medical): Yes   Lack of Transportation (Non-Medical): Yes  Physical Activity: Not on file  Stress: Not on file  Social Connections: Not on file  Intimate Partner Violence: Not on file    Physical Exam Vitals reviewed.  Constitutional:      Appearance: Normal appearance. He is normal weight.  HENT:     Head: Normocephalic.     Nose: Nose normal.     Mouth/Throat:     Mouth: Mucous membranes are moist.      Pharynx: Oropharynx is clear.  Eyes:     Conjunctiva/sclera: Conjunctivae normal.     Pupils: Pupils are equal, round, and reactive to light.  Cardiovascular:     Rate and Rhythm: Normal rate and regular rhythm.     Pulses: Normal pulses.     Heart sounds: Normal heart sounds.  Pulmonary:     Effort: Pulmonary effort is normal. No respiratory distress.     Breath sounds: Normal breath sounds. No stridor. No wheezing, rhonchi or rales.  Abdominal:     General: Abdomen is flat.     Palpations: Abdomen is soft.  Musculoskeletal:        General: No swelling. Normal range of motion.     Cervical back: Normal range of motion and neck supple.     Right lower leg: No edema.     Left lower leg: No edema.  Skin:    General: Skin is warm and dry.     Capillary Refill: Capillary refill takes less than 2 seconds.  Neurological:     General: No focal deficit present.     Mental Status: He is alert. Mental status is at baseline.  Psychiatric:        Mood and Affect: Mood normal.        Future Appointments  Date Time Provider [place]  12/26/2021  3:00 PM [facility] PA/NP [facility] None  02/02/2022 11:00 AM [name], NP [facility] None  03/03/2022  7:40 AM [facility] DEVICE REMOTES [facility]  03/30/2022  1:20 PM [name], NP [facility] None     ACTION: Home visit completed

## 2021-12-07 ENCOUNTER — Other Ambulatory Visit: Payer: Self-pay

## 2021-12-12 ENCOUNTER — Encounter (HOSPITAL_COMMUNITY): Payer: Self-pay | Admitting: Cardiovascular Disease

## 2021-12-12 ENCOUNTER — Encounter (HOSPITAL_COMMUNITY): Payer: Medicare (Managed Care)

## 2021-12-12 ENCOUNTER — Encounter (HOSPITAL_COMMUNITY)
Admission: RE | Disposition: A | Discharge status: Home / Self Care | Payer: Medicare Other | Source: Home / Self Care | Attending: Cardiovascular Disease

## 2021-12-12 ENCOUNTER — Ambulatory Visit (HOSPITAL_BASED_OUTPATIENT_CLINIC_OR_DEPARTMENT_OTHER)
Admission: RE | Admit: 2021-12-12 | Discharge: 2021-12-12 | Disposition: A | Discharge status: Home / Self Care | Payer: Medicare Other | Source: Ambulatory Visit | Attending: Cardiology | Admitting: Cardiology

## 2021-12-12 ENCOUNTER — Other Ambulatory Visit: Payer: Self-pay

## 2021-12-12 ENCOUNTER — Ambulatory Visit (HOSPITAL_COMMUNITY)
Admission: RE | Admit: 2021-12-12 | Discharge: 2021-12-12 | Disposition: A | Discharge status: Home / Self Care | Payer: Medicare Other | Attending: Cardiovascular Disease | Admitting: Cardiovascular Disease

## 2021-12-12 ENCOUNTER — Ambulatory Visit (HOSPITAL_COMMUNITY): Payer: Medicare Other | Admitting: Anesthesiology

## 2021-12-12 ENCOUNTER — Telehealth (HOSPITAL_COMMUNITY): Payer: Self-pay | Admitting: *Deleted

## 2021-12-12 DIAGNOSIS — I700 Atherosclerosis of aorta: Secondary | ICD-10-CM | POA: Diagnosis not present

## 2021-12-12 DIAGNOSIS — Z7901 Long term (current) use of anticoagulants: Secondary | ICD-10-CM | POA: Insufficient documentation

## 2021-12-12 DIAGNOSIS — I2510 Atherosclerotic heart disease of native coronary artery without angina pectoris: Secondary | ICD-10-CM | POA: Diagnosis not present

## 2021-12-12 DIAGNOSIS — Z8719 Personal history of other diseases of the digestive system: Secondary | ICD-10-CM

## 2021-12-12 DIAGNOSIS — E119 Type 2 diabetes mellitus without complications: Secondary | ICD-10-CM | POA: Diagnosis not present

## 2021-12-12 DIAGNOSIS — I480 Paroxysmal atrial fibrillation: Secondary | ICD-10-CM | POA: Diagnosis present

## 2021-12-12 DIAGNOSIS — Z95818 Presence of other cardiac implants and grafts: Secondary | ICD-10-CM | POA: Diagnosis not present

## 2021-12-12 DIAGNOSIS — I110 Hypertensive heart disease with heart failure: Secondary | ICD-10-CM | POA: Diagnosis not present

## 2021-12-12 DIAGNOSIS — I5022 Chronic systolic (congestive) heart failure: Secondary | ICD-10-CM | POA: Diagnosis not present

## 2021-12-12 DIAGNOSIS — I351 Nonrheumatic aortic (valve) insufficiency: Secondary | ICD-10-CM | POA: Diagnosis not present

## 2021-12-12 DIAGNOSIS — F209 Schizophrenia, unspecified: Secondary | ICD-10-CM | POA: Diagnosis not present

## 2021-12-12 HISTORY — DX: Presence of cardiac pacemaker: Z95.0

## 2021-12-12 HISTORY — DX: Presence of automatic (implantable) cardiac defibrillator: Z95.810

## 2021-12-12 HISTORY — PX: TEE WITHOUT CARDIOVERSION: SHX5443

## 2021-12-12 LAB — GLUCOSE, CAPILLARY
Glucose-Capillary: 184 mg/dL — ABNORMAL HIGH (ref 70–99)
Glucose-Capillary: 184 mg/dL — ABNORMAL HIGH (ref 70–99)

## 2021-12-12 SURGERY — ECHOCARDIOGRAM, TRANSESOPHAGEAL
Anesthesia: Monitor Anesthesia Care

## 2021-12-12 MED ORDER — EMPAGLIFLOZIN 10 MG PO TABS
10.0000 mg | ORAL_TABLET | Freq: Every day | ORAL | 6 refills | Status: DC
  Filled 2021-12-12: qty 30, 30d supply, fill #0

## 2021-12-12 MED ORDER — PROPOFOL 10 MG/ML IV BOLUS
INTRAVENOUS | Status: DC | PRN
  Administered 2021-12-12: 20 mg via INTRAVENOUS

## 2021-12-12 MED ORDER — SODIUM CHLORIDE 0.9 % IV SOLN
INTRAVENOUS | Status: AC | PRN
  Administered 2021-12-12: 500 mL via INTRAVENOUS

## 2021-12-12 MED ORDER — PROPOFOL 500 MG/50ML IV EMUL
INTRAVENOUS | Status: DC | PRN
  Administered 2021-12-12: 150 ug/kg/min via INTRAVENOUS

## 2021-12-12 MED ORDER — ONDANSETRON HCL 4 MG/2ML IJ SOLN: 4.0000 mg | Freq: Once | INTRAMUSCULAR | Status: DC | PRN

## 2021-12-12 NOTE — Progress Notes (Signed)
°  Echocardiogram Echocardiogram Transesophageal has been performed.  [name] 12/12/2021, 12:03 PM

## 2021-12-12 NOTE — Telephone Encounter (Signed)
Pt changed from [place] to [place] due to insurance coverage, change approved by Dr Aundra Dubin

## 2021-12-12 NOTE — CV Procedure (Signed)
° ° °  TRANSESOPHAGEAL ECHOCARDIOGRAM   NAME:  [name]    MRN: [id number] DOB:  01/21/1961    ADMIT DATE: 12/12/2021  INDICATIONS: Atrial fibrillation   PROCEDURE:   Informed consent was obtained prior to the procedure. The risks, benefits and alternatives for the procedure were discussed and the patient comprehended these risks.  Risks include, but are not limited to, cough, sore throat, vomiting, nausea, somnolence, esophageal and stomach trauma or perforation, bleeding, low blood pressure, aspiration, pneumonia, infection, trauma to the teeth and death.    Procedural time out performed. The oropharynx was anesthetized with topical 1% benzocaine.    Anesthesia was administered by Dr. Doroteo Glassman.  The patient was administered 330 mg of propofol and 0 mg of lidocaine to achieve and maintain moderate conscious sedation.  The patient's heart rate, blood pressure, and oxygen saturation are monitored continuously during the procedure. The period of conscious sedation is 15 minutes, of which I was present face-to-face 100% of this time.   The transesophageal probe was inserted in the esophagus and stomach without difficulty and multiple views were obtained.   COMPLICATIONS:    There were no immediate complications.  KEY FINDINGS:  27 mm Watchman FLX. No device thrombus or leak.  Full report to follow. Further management per primary team.   [place] T. Audie Box, MD, [place]  [address] [place], [place] [address] [phone]  11:21 AM

## 2021-12-12 NOTE — Anesthesia Postprocedure Evaluation (Signed)
Anesthesia Post Note  Patient: CHRISTINE SCHIEFELBEIN  Procedure(s) Performed: TRANSESOPHAGEAL ECHOCARDIOGRAM (TEE)     Patient location during evaluation: PACU Anesthesia Type: MAC Level of consciousness: awake and alert Pain management: pain level controlled Vital Signs Assessment: post-procedure vital signs reviewed and stable Respiratory status: spontaneous breathing, nonlabored ventilation and respiratory function stable Cardiovascular status: blood pressure returned to baseline and stable Postop Assessment: no apparent nausea or vomiting Anesthetic complications: no   No notable events documented.  Last Vitals:  Vitals:   12/12/21 1145 12/12/21 1200  BP: (!) 153/86 129/82  Pulse: 76 67  Resp: 14 17  Temp:  (!) 36.4 C  SpO2: 97% 100%    Last Pain:  Vitals:   12/12/21 1200  PainSc: 0-No pain                 Jarome Matin Taija Mathias

## 2021-12-12 NOTE — Anesthesia Preprocedure Evaluation (Addendum)
Anesthesia Evaluation  Patient identified by MRN, date of birth, ID band Patient awake    Reviewed: Allergy & Precautions, NPO status , Patient's Chart, lab work & pertinent test results, reviewed documented beta blocker date and time   Airway Mallampati: III  TM Distance: >3 FB Neck ROM: Full    Dental  (+) Dental Advisory Given, Missing   Pulmonary sleep apnea , former smoker,    Pulmonary exam normal breath sounds clear to auscultation       Cardiovascular hypertension, Pt. on medications and Pt. on home beta blockers + CAD and +CHF  + dysrhythmias (s/p Watchman) Atrial Fibrillation + pacemaker + Cardiac Defibrillator  Rhythm:Irregular Rate:Abnormal     Neuro/Psych PSYCHIATRIC DISORDERS Anxiety Depression Schizophrenia  Neuromuscular disease    GI/Hepatic Neg liver ROS, GERD  Medicated,  Endo/Other  diabetes, Type 2, Insulin Dependent, Oral Hypoglycemic AgentsMorbid obesity  Renal/GU Renal InsufficiencyRenal disease     Musculoskeletal negative musculoskeletal ROS (+)   Abdominal   Peds  Hematology  (+) Blood dyscrasia (Eliquis), ,   Anesthesia Other Findings Day of surgery medications reviewed with the patient.  Reproductive/Obstetrics                             Anesthesia Physical Anesthesia Plan  ASA: 3  Anesthesia Plan: MAC   Post-op Pain Management: Minimal or no pain anticipated   Induction: Intravenous  PONV Risk Score and Plan: 1 and Propofol infusion and Treatment may vary due to age or medical condition  Airway Management Planned: Natural Airway and Simple Face Mask  Additional Equipment:   Intra-op Plan:   Post-operative Plan:   Informed Consent: I have reviewed the patients History and Physical, chart, labs and discussed the procedure including the risks, benefits and alternatives for the proposed anesthesia with the patient or authorized representative who has  indicated his/her understanding and acceptance.     Dental advisory given  Plan Discussed with: CRNA  Anesthesia Plan Comments:         Anesthesia Quick Evaluation

## 2021-12-12 NOTE — Interval H&P Note (Signed)
History and Physical Interval Note:  12/12/2021 10:10 AM  [name]  has presented today for surgery, with the diagnosis of POST WATCHMAN IMPLANT.  The various methods of treatment have been discussed with the patient and family. After consideration of risks, benefits and other options for treatment, the patient has consented to  Procedure(s): TRANSESOPHAGEAL ECHOCARDIOGRAM (TEE) (N/A) as a surgical intervention.  The patient's history has been reviewed, patient examined, no change in status, stable for surgery.  I have reviewed the patient's chart and labs.  Questions were answered to the patient's satisfaction.    NPO for TEE for watchman FLX. No issues with swallowing.   [place] T. Audie Box, MD, [place]  [address], [place], [place] [address] [phone]  10:11 AM

## 2021-12-12 NOTE — Anesthesia Procedure Notes (Signed)
Procedure Name: MAC Date/Time: 12/12/2021 11:00 AM Performed by: Jenne Campus, CRNA Pre-anesthesia Checklist: Patient identified, Emergency Drugs available, Suction available and Patient being monitored Oxygen Delivery Method: Simple face mask

## 2021-12-12 NOTE — Transfer of Care (Signed)
Immediate Anesthesia Transfer of Care Note  Patient: [name]  Procedure(s) Performed: TRANSESOPHAGEAL ECHOCARDIOGRAM (TEE)  Patient Location: PACU  Anesthesia Type:MAC  Level of Consciousness: awake, oriented and patient cooperative  Airway & Oxygen Therapy: Patient Spontanous Breathing and Patient connected to face mask oxygen  Post-op Assessment: Report given to RN and Post -op Vital signs reviewed and stable  Post vital signs: Reviewed  Last Vitals:  Vitals Value Taken Time  BP 132/83 12/12/21 1131  Temp 36.9 C 12/12/21 1130  Pulse 82 12/12/21 1136  Resp 19 12/12/21 1136  SpO2 98 % 12/12/21 1136  Vitals shown include unvalidated device data.  Last Pain:  Vitals:   12/12/21 1130  PainSc: 0-No pain         Complications: No notable events documented.

## 2021-12-13 ENCOUNTER — Other Ambulatory Visit (HOSPITAL_COMMUNITY): Payer: Self-pay

## 2021-12-13 ENCOUNTER — Encounter (HOSPITAL_COMMUNITY): Payer: Self-pay | Admitting: Cardiovascular Disease

## 2021-12-13 ENCOUNTER — Other Ambulatory Visit: Payer: Self-pay

## 2021-12-13 ENCOUNTER — Telehealth: Payer: Self-pay

## 2021-12-13 MED ORDER — CLOPIDOGREL BISULFATE 75 MG PO TABS: 75.0000 mg | ORAL_TABLET | Freq: Every day | ORAL | 1 refills | Status: DC

## 2021-12-13 NOTE — Telephone Encounter (Signed)
Per Dr. Bonna Gains note, "Post Implant Anticoagulation Strategy: Eliquis 5mg  PO BID for 45 days. If TEE shows good device position without DRT at 45 days, plan to stop Eliquis and transition to Plavix 75mg  PO daily to complete 6 months post implant therapy.  Per Dr. Kathalene Frames 45-day TEE report, "27 mm Watchman FLX. 2D max diameter 23.5 mm (13% compression). No device thrombus or device leak. The device is well seated. No left atrial/left atrial appendage thrombus was detected."  Discussed with Kathyrn Drown. Instructed the patient to STOP ELIQUIS and START PLAVIX 75 mg daily. 6 month visit scheduled 04/26/2022. Spoke to the patient's home health nurse, Nira Conn, and confirmed instructions and upcoming appointment. The patient and Nira Conn were grateful for call and agree with plan.

## 2021-12-13 NOTE — Progress Notes (Signed)
Remote ICD transmission.   

## 2021-12-13 NOTE — Progress Notes (Signed)
Paramedicine Encounter    Patient ID: [name], male    DOB: November 18, 1961, 61 y.o.   MRN: [id number]  Arrived for med rec for [name].  All medications reviewed and confirmed.  Pill box filled accordingly.   I spoke to [facility] nurse and she advised [name] is to stop Eliquis start Plavix until his visit in May.   Plavix is being sent to [facility] for [name] to pick up. We will begin same next week.   Appointments reviewed and confirmed.   I will see [name] in one week for full paramedicine home visit.     ACTION: Home visit completed

## 2021-12-14 ENCOUNTER — Telehealth (HOSPITAL_COMMUNITY): Payer: Self-pay

## 2021-12-14 NOTE — Telephone Encounter (Signed)
Fabio Pierce, Mr Alfonzo's Ozempic shipment should have a few different doses in it to help with the titration. There should be 2 pens of 0.25/0.5mg  - he'll need 0.25mg  injected SQ once weekly for the first 4 weeks, then increase to 0.5mg  once weekly for the next 6 weeks (will use up the first 2 pens).   As long as he's tolerating those doses well, starting on month 3 the dose would be increased to 1mg  weekly (should have 2 pens = 2 months of this dose).   I'll touch base in a few months to see how he's doing. If he's tolerating that dose well, we can increase to the 2mg  dose.   Let me know if his glucose starts dropping low, we may need to decrease his insulin dose as he gets started with the Ozempic.

## 2021-12-14 NOTE — Telephone Encounter (Signed)
Received shipment of patient's Ozempic medication,[name] made aware and will pick up medication.

## 2021-12-14 NOTE — Telephone Encounter (Signed)
Thank you, glad it finally arrived! He was approved 10/26/21 with 7 week delay in shipping the med. We had [facility] address on [facility] for med to be delivered to, not sure why it came to CHF clinic. Please store the medication in the fridge until [place] picks it up. I'll call her to discuss Ozempic dosing.

## 2021-12-20 ENCOUNTER — Encounter (HOSPITAL_COMMUNITY): Payer: Self-pay

## 2021-12-20 ENCOUNTER — Other Ambulatory Visit: Payer: Self-pay

## 2021-12-20 ENCOUNTER — Other Ambulatory Visit (HOSPITAL_COMMUNITY): Payer: Self-pay

## 2021-12-20 NOTE — Progress Notes (Signed)
Paramedicine Encounter    Patient ID: [name], male    DOB: 24-Sep-1961, 61 y.o.   MRN: [id number]   Arrived for home visit for [name] who reports he is feeling good. He denied shortness of breath, chest pain or dizziness. He did report he has chronic pain but is being managed by Pain Management with [facility]. He is taking 62m Oxycodone 3x's daily.   Assessment and vitals obtained. No swelling, lungs clear. Vitals stable.   Meds reviewed and confirmed. Pill box filled accordingly.   He was seen by [facility] for Erectile Dysfunction concerns and they gave him Tadlifil 255mas needed. I messaged HF clinic regarding same and did not receive a note back. I will continue to follow up on this.   Ozempic was also started today with first injection at 330. [name] and I discussed side effects and if his sugar drops to call me and let me know. He is aware and is using his Freestyle Libre to check it.   CBG- 165   We reviewed appointments and confirmed same.   ChGerald Stabsants all his med[facility] [employer] [place] will send message.   Home visit complete. I will see ChGerald Stabsext week on Monday in clinic.   Refills: Potassium Glipizide Gabapentin Carvedilol Metformin   Patient Care Team: KiVevelyn FrancoisNP as PCP - General (Adult Health Nurse Practitioner) TuSueanne MargaritaMD as PCP - Cardiology (Cardiology) McLarey DresserMD as PCP - Advanced Heart Failure (Cardiology) LaVickie EpleyMD as PCP - Electrophysiology (Cardiology) UrJorge NyLCSW as Social Worker (Licensed Clinical Social Worker) BeThornton ParkMD as Consulting Physician (Gastroenterology)  Patient Active Problem List   Diagnosis Date Noted   Presence of Watchman left atrial appendage closure device 10/28/2021   Atrial fibrillation (HCCaney [place]12/11/2020   Melena    Chronic diastolic CHF (congestive heart failure) (HCLake [place]ates   GI bleed 06/29/2021   Severe anemia 06/29/2021    Adenomatous polyp of ascending colon    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   Acute blood loss anemia    Angiodysplasia of stomach    Chronic anticoagulation    CHF exacerbation (HCYakutat07/20/2021   AKI (acute kidney injury) (HCPapaikou07/20/2021   CKD (chronic kidney disease), stage III (HCRidgeway07/20/2021   Anemia due to chronic blood loss    Gastric AVM    Angiodysplasia of duodenum with hemorrhage    Acute on chronic systolic (congestive) heart failure (HCWaverly05/08/2020   Anemia 04/05/2020   PAF (paroxysmal atrial fibrillation) (HCSuffern05/08/2020   OSA on CPAP 04/05/2020   Syncope and collapse 04/05/2020   Type 2 diabetes mellitus with hyperglycemia, with long-term current use of insulin (HCBlackburn01/04/2020   Diabetic polyneuropathy associated with type 2 diabetes mellitus (HCMount [place]01/04/2020   Erectile dysfunction 12/03/2019   Paranoid schizophrenia, chronic condition (HCWayne03/11/2014   Severe recurrent major depressive disorder with psychotic features (HCShiocton03/11/2014   GAD (generalized anxiety disorder) 01/26/2015   OCD (obsessive compulsive disorder) 01/26/2015   Panic disorder without agoraphobia 01/26/2015   Insomnia 01/26/2015    Current Outpatient Medications:    acetaminophen (TYLENOL) 500 MG tablet, Take 2 tablets (1,000 mg total) by mouth 2 (two) times daily as needed. (Patient taking differently: Take 1,000 mg by mouth in the morning and at bedtime.), Disp: 180 tablet, Rfl: 3   albuterol (VENTOLIN HFA) 108 (90 Base) MCG/ACT inhaler, Inhale 2 puffs into the lungs  every 6 (six) hours as needed for wheezing or shortness of breath., Disp: 18 g, Rfl: 3   amoxicillin (AMOXIL) 500 MG capsule, Take 4 capsules (2,000 mg total) by mouth as directed. Take 4 tablets 1 hour prior to dental work, including cleanings. (Patient not taking: Reported on 12/13/2021), Disp: 12 capsule, Rfl: 12   atorvastatin (LIPITOR) 40 MG tablet, Take 1 tablet (40 mg total) by mouth daily at 6  PM., Disp: 90 tablet, Rfl: 1   blood glucose meter kit and supplies KIT, Dispense based on patient and insurance preference. Use up to four times daily as directed. (FOR ICD-9 250.00, 250.01)., Disp: 1 each, Rfl: 0   carvedilol (COREG) 6.25 MG tablet, Take 1 tablet (6.25 mg total) by mouth 2 (two) times daily with a meal., Disp: 60 tablet, Rfl: 3   clopidogrel (PLAVIX) 75 MG tablet, Take 1 tablet (75 mg total) by mouth daily., Disp: 90 tablet, Rfl: 1   Continuous Blood Gluc Receiver (FREESTYLE LIBRE [place]) DEVI, 1 each by Does not apply route as needed., Disp: 1 each, Rfl: 11   Continuous Blood Gluc Sensor (FREESTYLE LIBRE 14 DAY SENSOR) MISC, Use one applicator every 14 (fourteen) days., Disp: 6 each, Rfl: 3   cyclobenzaprine (FLEXERIL) 10 MG tablet, Take 10 mg by mouth 3 (three) times daily as needed for muscle spasms., Disp: , Rfl:    empagliflozin (JARDIANCE) 10 MG TABS tablet, Take 1 tablet (10 mg total) by mouth daily before breakfast., Disp: 30 tablet, Rfl: 6   ferrous sulfate 325 (65 FE) MG tablet, Take 1 tablet (325 mg total) by mouth every other day. (Patient taking differently: Take 325 mg by mouth every Monday, Wednesday, and Friday.), Disp: 30 tablet, Rfl: 0   furosemide (LASIX) 40 MG tablet, Take 1.5 tablets (60 mg total) by mouth 2 (two) times daily., Disp: 90 tablet, Rfl: 3   gabapentin (NEURONTIN) 800 MG tablet, Take 1 tablet (800 mg total) by mouth every 8 (eight) hours. (Patient taking differently: Take 800 mg by mouth 2 (two) times daily.), Disp: 90 tablet, Rfl: 11   glipiZIDE (GLUCOTROL) 10 MG tablet, TAKE 1 TABLET (10 MG TOTAL) BY MOUTH 2 (TWO) TIMES DAILY BEFORE A MEAL., Disp: 60 tablet, Rfl: 6   insulin glargine (LANTUS) 100 UNIT/ML injection, Inject 0.2 mLs (20 Units total) into the skin daily., Disp: 20 mL, Rfl: 1   Insulin Lispro Prot & Lispro (HUMALOG MIX 75/25 KWIKPEN) (75-25) 100 UNIT/ML Kwikpen, Inject 30 Units into the skin 2 (two) times daily., Disp: 15 mL, Rfl:  11   Insulin Pen Needle (TRUEPLUS 5-BEVEL PEN NEEDLES) 31G X 6 MM MISC, See admin instructions., Disp: 100 each, Rfl: 0   Insulin Syringe-Needle U-100 (TRUEPLUS INSULIN SYRINGE) 31G X 5/16" 0.3 ML MISC, USE 3 TIMES DAILY TO INJECT INSULIN, Disp: 300 each, Rfl: 3   metFORMIN (GLUCOPHAGE) 500 MG tablet, TAKE 1 TABLET (500 MG TOTAL) BY MOUTH 2 (TWO) TIMES DAILY WITH A MEAL., Disp: 60 tablet, Rfl: 6   Multiple Vitamins-Minerals (CENTRUM SILVER 50+MEN) TABS, Take 1 tablet by mouth daily. (Patient not taking: Reported on 12/13/2021), Disp: , Rfl:    pantoprazole (PROTONIX) 40 MG tablet, Take 1 tablet (40 mg total) by mouth daily. (Patient not taking: Reported on 12/13/2021), Disp: 90 tablet, Rfl: 3   potassium chloride SA (KLOR-CON M) 20 MEQ tablet, Take 2 tablets (40 mEq total) by mouth daily., Disp: 60 tablet, Rfl: 3   QUEtiapine (SEROQUEL) 300 MG tablet, take 1.5 tab  by mouth daily, Disp: 45 tablet, Rfl: 0   sacubitril-valsartan (ENTRESTO) 24-26 MG, Take 1 tablet by mouth 2 (two) times daily., Disp: 180 tablet, Rfl: 3   Semaglutide (OZEMPIC, 0.25 OR 0.5 MG/DOSE, [place]), Inject 0.25 mg into the skin once a week., Disp: , Rfl:    spironolactone (ALDACTONE) 25 MG tablet, Take 0.5 tablets (12.5 mg total) by mouth at bedtime., Disp: 45 tablet, Rfl: 3   TRUEPLUS PEN NEEDLES 31G X 6 MM MISC, USE AS DIRECTED, Disp: 100 each, Rfl: 0 Not on File   Social History   Socioeconomic History   Marital status: Legally Separated    Spouse name: Not on file   Number of children: Not on file   Years of education: Not on file   Highest education level: Not on file  Occupational History   Not on file  Tobacco Use   Smoking status: Former   Smokeless tobacco: Never  Vaping Use   Vaping Use: Never used  Substance and Sexual Activity   Alcohol use: Not Currently    Alcohol/week: 2.0 standard drinks    Types: 2 Cans of beer per week   Drug use: No   Sexual activity: Yes  Other Topics Concern   Not on file   Social History Narrative   Not on file   Social Determinants of Health   Financial Resource Strain: Not on file  Food Insecurity: No Food Insecurity   Worried About Charity fundraiser in the Last Year: Never true   Ran Out of Food in the Last Year: Never true  Transportation Needs: Unmet Transportation Needs   Lack of Transportation (Medical): Yes   Lack of Transportation (Non-Medical): Yes  Physical Activity: Not on file  Stress: Not on file  Social Connections: Not on file  Intimate Partner Violence: Not on file    Physical Exam Vitals reviewed.  Constitutional:      Appearance: Normal appearance. He is normal weight.  HENT:     Head: Normocephalic.     Nose: Nose normal.     Mouth/Throat:     Mouth: Mucous membranes are moist.     Pharynx: Oropharynx is clear.  Eyes:     Conjunctiva/sclera: Conjunctivae normal.     Pupils: Pupils are equal, round, and reactive to light.  Cardiovascular:     Rate and Rhythm: Normal rate and regular rhythm.     Pulses: Normal pulses.     Heart sounds: Normal heart sounds.  Pulmonary:     Effort: Pulmonary effort is normal.     Breath sounds: Normal breath sounds.  Abdominal:     General: Abdomen is flat.     Palpations: Abdomen is soft.  Musculoskeletal:        General: No swelling. Normal range of motion.     Cervical back: Normal range of motion.     Right lower leg: No edema.     Left lower leg: No edema.  Skin:    General: Skin is warm and dry.     Capillary Refill: Capillary refill takes less than 2 seconds.  Neurological:     General: No focal deficit present.     Mental Status: He is alert. Mental status is at baseline.  Psychiatric:        Mood and Affect: Mood normal.        Future Appointments  Date Time Provider [place]  12/26/2021  3:00 PM [facility] PA/NP [facility] None  02/02/2022 11:00 AM Edison Pace,  Diona Foley, NP [place] None  03/03/2022  7:40 AM [facility] DEVICE REMOTES [facility]   03/30/2022  1:20 PM [name], NP [facility] None  04/26/2022  1:30 PM [facility] STRUCTURAL HEART APP [facility]  06/02/2022  7:40 AM [facility] DEVICE REMOTES [facility]  09/01/2022  7:40 AM [facility] DEVICE REMOTES [facility]  12/01/2022  7:40 AM [facility] DEVICE REMOTES [facility]  03/02/2023  7:40 AM [facility] DEVICE REMOTES [facility]  06/01/2023  7:40 AM [facility] DEVICE REMOTES [facility]     ACTION: Home visit completed

## 2021-12-20 NOTE — Progress Notes (Unsigned)
Samples of Jardiance were given to the patient, quantity 2, Lot Number [id number]

## 2021-12-21 ENCOUNTER — Other Ambulatory Visit (HOSPITAL_COMMUNITY): Payer: Self-pay | Admitting: Family Medicine

## 2021-12-21 ENCOUNTER — Other Ambulatory Visit (HOSPITAL_COMMUNITY): Payer: Self-pay | Admitting: *Deleted

## 2021-12-21 MED ORDER — POTASSIUM CHLORIDE CRYS ER 20 MEQ PO TBCR: 40.0000 meq | EXTENDED_RELEASE_TABLET | Freq: Every day | ORAL | 3 refills | Status: DC

## 2021-12-21 MED ORDER — SPIRONOLACTONE 25 MG PO TABS: 12.5000 mg | ORAL_TABLET | Freq: Every day | ORAL | 3 refills | Status: DC

## 2021-12-21 MED ORDER — EMPAGLIFLOZIN 10 MG PO TABS: 10.0000 mg | ORAL_TABLET | Freq: Every day | ORAL | 6 refills | Status: DC

## 2021-12-21 MED ORDER — CARVEDILOL 6.25 MG PO TABS: 6.2500 mg | ORAL_TABLET | Freq: Two times a day (BID) | ORAL | 3 refills | Status: DC

## 2021-12-21 MED ORDER — ATORVASTATIN CALCIUM 40 MG PO TABS: 40.0000 mg | ORAL_TABLET | Freq: Every day | ORAL | 1 refills | Status: DC

## 2021-12-21 MED ORDER — FUROSEMIDE 40 MG PO TABS: 60.0000 mg | ORAL_TABLET | Freq: Two times a day (BID) | ORAL | 3 refills | Status: DC

## 2021-12-23 ENCOUNTER — Other Ambulatory Visit: Payer: Self-pay | Admitting: Nurse Practitioner

## 2021-12-23 DIAGNOSIS — R7309 Other abnormal glucose: Secondary | ICD-10-CM

## 2021-12-23 DIAGNOSIS — E1165 Type 2 diabetes mellitus with hyperglycemia: Secondary | ICD-10-CM

## 2021-12-23 MED ORDER — TRUEPLUS 5-BEVEL PEN NEEDLES 31G X 6 MM MISC: 1.0000 "pen " | Freq: Two times a day (BID) | 1 refills | Status: AC

## 2021-12-23 MED ORDER — FERROUS SULFATE 325 (65 FE) MG PO TABS: 325.0000 mg | ORAL_TABLET | ORAL | 1 refills | Status: DC

## 2021-12-23 MED ORDER — GLIPIZIDE 10 MG PO TABS: 10.0000 mg | ORAL_TABLET | Freq: Two times a day (BID) | ORAL | 1 refills | Status: DC

## 2021-12-23 MED ORDER — "INSULIN SYRINGE-NEEDLE U-100 31G X 5/16"" 0.3 ML MISC": 3 refills | Status: AC

## 2021-12-23 MED ORDER — PANTOPRAZOLE SODIUM 40 MG PO TBEC: 40.0000 mg | DELAYED_RELEASE_TABLET | Freq: Every day | ORAL | 1 refills | Status: DC

## 2021-12-23 MED ORDER — INSULIN GLARGINE 100 UNIT/ML ~~LOC~~ SOLN: 20.0000 [IU] | Freq: Every day | SUBCUTANEOUS | 1 refills | Status: DC

## 2021-12-23 MED ORDER — QUETIAPINE FUMARATE 300 MG PO TABS: 450.0000 mg | ORAL_TABLET | Freq: Every day | ORAL | 1 refills | Status: DC

## 2021-12-23 MED ORDER — ALBUTEROL SULFATE HFA 108 (90 BASE) MCG/ACT IN AERS: 2.0000 | INHALATION_SPRAY | Freq: Four times a day (QID) | RESPIRATORY_TRACT | 1 refills | Status: AC | PRN

## 2021-12-23 MED ORDER — INSULIN LISPRO PROT & LISPRO (75-25 MIX) 100 UNIT/ML KWIKPEN: 30.0000 [IU] | PEN_INJECTOR | Freq: Two times a day (BID) | SUBCUTANEOUS | 1 refills | Status: DC

## 2021-12-23 MED ORDER — METFORMIN HCL 500 MG PO TABS: 500.0000 mg | ORAL_TABLET | Freq: Two times a day (BID) | ORAL | 1 refills | Status: DC

## 2021-12-23 MED ORDER — GABAPENTIN 800 MG PO TABS: 800.0000 mg | ORAL_TABLET | Freq: Three times a day (TID) | ORAL | 1 refills | Status: DC

## 2021-12-26 ENCOUNTER — Other Ambulatory Visit: Payer: Self-pay

## 2021-12-26 ENCOUNTER — Ambulatory Visit (HOSPITAL_COMMUNITY)
Admission: RE | Admit: 2021-12-26 | Discharge: 2021-12-26 | Disposition: A | Discharge status: Home / Self Care | Payer: Medicare Other | Source: Ambulatory Visit | Attending: Family Medicine | Admitting: Family Medicine

## 2021-12-26 ENCOUNTER — Encounter (HOSPITAL_COMMUNITY): Payer: Self-pay

## 2021-12-26 VITALS — BP 90/52 | HR 86 | Wt 321.8 lb

## 2021-12-26 DIAGNOSIS — I10 Essential (primary) hypertension: Secondary | ICD-10-CM

## 2021-12-26 DIAGNOSIS — I5043 Acute on chronic combined systolic (congestive) and diastolic (congestive) heart failure: Secondary | ICD-10-CM | POA: Diagnosis not present

## 2021-12-26 DIAGNOSIS — K922 Gastrointestinal hemorrhage, unspecified: Secondary | ICD-10-CM | POA: Diagnosis not present

## 2021-12-26 DIAGNOSIS — Z95818 Presence of other cardiac implants and grafts: Secondary | ICD-10-CM | POA: Diagnosis not present

## 2021-12-26 DIAGNOSIS — Z79899 Other long term (current) drug therapy: Secondary | ICD-10-CM | POA: Diagnosis not present

## 2021-12-26 DIAGNOSIS — Z955 Presence of coronary angioplasty implant and graft: Secondary | ICD-10-CM | POA: Insufficient documentation

## 2021-12-26 DIAGNOSIS — E1122 Type 2 diabetes mellitus with diabetic chronic kidney disease: Secondary | ICD-10-CM | POA: Diagnosis not present

## 2021-12-26 DIAGNOSIS — I2510 Atherosclerotic heart disease of native coronary artery without angina pectoris: Secondary | ICD-10-CM | POA: Diagnosis not present

## 2021-12-26 DIAGNOSIS — F1091 Alcohol use, unspecified, in remission: Secondary | ICD-10-CM | POA: Insufficient documentation

## 2021-12-26 DIAGNOSIS — D509 Iron deficiency anemia, unspecified: Secondary | ICD-10-CM | POA: Diagnosis not present

## 2021-12-26 DIAGNOSIS — Z7984 Long term (current) use of oral hypoglycemic drugs: Secondary | ICD-10-CM | POA: Insufficient documentation

## 2021-12-26 DIAGNOSIS — F209 Schizophrenia, unspecified: Secondary | ICD-10-CM | POA: Diagnosis not present

## 2021-12-26 DIAGNOSIS — Z7902 Long term (current) use of antithrombotics/antiplatelets: Secondary | ICD-10-CM | POA: Diagnosis not present

## 2021-12-26 DIAGNOSIS — I130 Hypertensive heart and chronic kidney disease with heart failure and stage 1 through stage 4 chronic kidney disease, or unspecified chronic kidney disease: Secondary | ICD-10-CM

## 2021-12-26 DIAGNOSIS — G4733 Obstructive sleep apnea (adult) (pediatric): Secondary | ICD-10-CM | POA: Diagnosis not present

## 2021-12-26 DIAGNOSIS — Z8719 Personal history of other diseases of the digestive system: Secondary | ICD-10-CM | POA: Diagnosis not present

## 2021-12-26 DIAGNOSIS — E669 Obesity, unspecified: Secondary | ICD-10-CM

## 2021-12-26 DIAGNOSIS — I480 Paroxysmal atrial fibrillation: Secondary | ICD-10-CM | POA: Diagnosis not present

## 2021-12-26 DIAGNOSIS — Z6841 Body Mass Index (BMI) 40.0 and over, adult: Secondary | ICD-10-CM | POA: Diagnosis not present

## 2021-12-26 DIAGNOSIS — I5032 Chronic diastolic (congestive) heart failure: Secondary | ICD-10-CM

## 2021-12-26 DIAGNOSIS — I5022 Chronic systolic (congestive) heart failure: Secondary | ICD-10-CM | POA: Diagnosis not present

## 2021-12-26 DIAGNOSIS — Z7985 Long-term (current) use of injectable non-insulin antidiabetic drugs: Secondary | ICD-10-CM | POA: Diagnosis not present

## 2021-12-26 DIAGNOSIS — Z550 Illiteracy and low-level literacy: Secondary | ICD-10-CM | POA: Diagnosis not present

## 2021-12-26 DIAGNOSIS — Z8249 Family history of ischemic heart disease and other diseases of the circulatory system: Secondary | ICD-10-CM | POA: Insufficient documentation

## 2021-12-26 DIAGNOSIS — N1831 Chronic kidney disease, stage 3a: Secondary | ICD-10-CM | POA: Diagnosis not present

## 2021-12-26 LAB — BASIC METABOLIC PANEL
Anion gap: 10 (ref 5–15)
BUN: 16 mg/dL (ref 8–23)
CO2: 26 mmol/L (ref 22–32)
Calcium: 9.1 mg/dL (ref 8.9–10.3)
Chloride: 104 mmol/L (ref 98–111)
Creatinine, Ser: 1.06 mg/dL (ref 0.61–1.24)
GFR, Estimated: >60 mL/min (ref >60)
Glucose, Bld: 96 mg/dL (ref 70–99)
Potassium: 3.9 mmol/L (ref 3.5–5.1)
Sodium: 140 mmol/L (ref 135–145)

## 2021-12-26 NOTE — Patient Instructions (Signed)
No change in medications.  Labs done today, your results will be available in MyChart, we will contact you for abnormal readings.  You have been referred to sleep study clinic, they will call you to arrange your appointment.  Your physician has recommended that you have a sleep study. This test records several body functions during sleep, including: brain activity, eye movement, oxygen and carbon dioxide blood levels, heart rate and rhythm, breathing rate and rhythm, the flow of air through your mouth and nose, snoring, body muscle movements, and chest and belly movement.  If you have any questions or concerns before your next appointment please send [place] a message through [place] or call our office at [phone].    TO LEAVE A MESSAGE FOR THE NURSE SELECT OPTION 2, PLEASE LEAVE A MESSAGE INCLUDING: YOUR NAME DATE OF BIRTH CALL BACK NUMBER REASON FOR CALL**this is important as we prioritize the call backs  YOU WILL RECEIVE A CALL BACK THE SAME DAY AS LONG AS YOU CALL BEFORE 4:00 PM  At the [facility], you and your health needs are our priority. As part of our continuing mission to provide you with exceptional heart care, we have created designated Provider Care Teams. These Care Teams include your primary Cardiologist (physician) and Advanced Practice Providers (APPs- Physician Assistants and Nurse Practitioners) who all work together to provide you with the care you need, when you need it.   You may see any of the following providers on your designated Care Team at your next follow up: Dr Glori Bickers Dr Haynes Kerns, NP Lyda Jester, [place] [facility] [place], [place] [name], PharmD   Please be sure to bring in all your medications bottles to every appointment.

## 2021-12-26 NOTE — Progress Notes (Signed)
ADVANCED HEART FAILURE CLINIC NOTE  PCP: [name], NP Cardiology: Dr. Radford Pax HF Cardiology: Dr. Aundra Dubin  61 y.o. with history of chronic systolic CHF, CAD, type 2 diabetes, paroxysmal atrial fibrillation, and schizophrenia was referred by Dr. Radford Pax for evaluation of CHF.  Patient had OM2 PCI in 2012.  In 11/20, he was admitted with CHF. Echo showed EF 25-30% with diffuse hypokinesis.  LHC was done, showing occluded OM2 at prior stent, 90% D1 stenosis, and extensive diffuse RCA disease.  No intervention.  He was thought to be in paroxysmal atrial fibrillation during this appointment and apixaban was started.   He does not smoke, rarely drinks, and does not use drugs.  His mother had "heart problems."    He can write his name only. He is only able to read a few words. Thinks he completed the 7th grade.    Echo in 5/21 showed EF 30% with diffuse hypokinesis, mild LVH, PASP 38, mildly decreased RV systolic function, IVC dilated. Biotronik ICD placed.   He was hospitalized in 7/21 with upper GI bleeding and CHF exacerbation.  EGD showed duodenal AVMs, treated with APC. He was diuresed.   Clinic visit 5/22 he was volume overloaded, weight up 19 lbs, and diuretics increased and Farxiga started.   He was admitted to [facility] a couple weeks later (04/13/21) for a/c CHF. He was diuresed with IV lasix. Hospitalization complicated by GIB & AKI. Enteroscopy performed on 5/20 which showed 2 small angioectasias with no bleeding in the third portion of the duodenum. Colonoscopy on 5/21, showed multiple polyps which were biopsied. No source of bleeding found. He had pill endoscopy 5/22 which showed non bleeding AVM. Pt had repeat enteroscopy which showed non bleeding jejunal ulcer. Eliquis held during admission but restarted at discharge. Home hydralazine, spiro, Entresto, and Imdur stopped in setting of GIB; carvedilol dose decreased at discharge.  He returned 6/22 for post hospitalization HF follow up. Up 10  lbs per paramedicine. NYHA II symptoms. Entresto restarted and lasix increased.  Admitted 7/22 for chest pain, found to have hgb of 4.7. He received 2 U PRBCs, Eliquis was held and GI & AHF were consulted for further management. Farxiga and spiro held with increase in kidney function. Repeat echo on 06/30/21 EF 50-55%, mildly dilated LV, mild LVH, RV okay, dilated IVC with estimated RA pressure of 8. He underwent EGD on 07/01/21 showing three non-bleeding angioectasias in the duodenum. Treated with argon plasma coagulation (APC). GDMT therapy added back as SCr improved. Eliquis restarted and arrangement made for referral for Watchman device consideration. Day of discharge weight 324 lbs, SCr 1.29 and hgb 8.3.   S/p Watchman 10/27/21.   Today he returns for HF follow up. Overall feeling fine. Followed by paramedicine and taking all of his meds. He has slight dyspnea with stairs. Denies palpitations, abnormal bleeding, CP, dizziness, edema, or PND/Orthopnea. Appetite ok. No fever or chills. Weight at home 321 pounds. He remains off ETOH.   ECG (personally reviewed): none ordered today.  Labs (1/21): LDL 66, HDL 42, hgb 11.3, K 4.7, creatinine 1.32 Labs (4/21): K 5, creatinine 1.37 Labs (7/21): K 4.1, creatinine 1.26, hgb 9.3 Labs (8/21): LDL 51, K 3.8, creatinine 1.3 Labs (11/21): K 4, creatinine 1.42, hgb 13.1, hgbA1c 11.7 Labs (2/22): HgbA1c 7.7 Labs (3/22): K 4.4, creatinine 1.27, pro-BNP 249. Labs (5/22): K 4.4, creatinine 1.22 Labs (8/22): K 4.1, creatinine 1.00, hgb 8.3 Labs (12/22): K 4.7, creatinine 1.13  PMH:  1. Atrial fibrillation:  Paroxysmal - s/p LAAO device (12/22) 2. Type 2 diabetes 3. HTN 4. Hyperlipidemia 5. Schizophrenia 6. CAD: PCI OM2 in 2012.  - LHC (11/20): 90% D1 stenosis, totally occluded OM2 at stent, serial 85%/70%/60% RCA stenoses.  7. 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  Biotronik ICD.  - Echo (11/20): EF 25-30%, global  hypokinesis.  - Echo (5/21): EF 30% with diffuse hypokinesis, mild LVH, PASP 38, mildly decreased RV systolic function, IVC dilated. - TEE (1/23): EF 50-55%, global LV HK, normal RV 8. Upper GI bleeding: 7/21, duodenal AVMs treated with APC.  9. OSA: Does not use CPAP regularly.   Social History   Socioeconomic History   Marital status: Legally Separated    Spouse name: Not on file   Number of children: Not on file   Years of education: Not on file   Highest education level: Not on file  Occupational History   Not on file  Tobacco Use   Smoking status: Former   Smokeless tobacco: Never  Vaping Use   Vaping Use: Never used  Substance and Sexual Activity   Alcohol use: Not Currently    Alcohol/week: 2.0 standard drinks    Types: 2 Cans of beer per week   Drug use: No   Sexual activity: Yes  Other Topics Concern   Not on file  Social History Narrative   Not on file   Social Determinants of Health   Financial Resource Strain: Not on file  Food Insecurity: No Food Insecurity   Worried About Charity fundraiser in the Last Year: Never true   Ran Out of Food in the Last Year: Never true  Transportation Needs: Unmet Transportation Needs   Lack of Transportation (Medical): Yes   Lack of Transportation (Non-Medical): Yes  Physical Activity: Not on file  Stress: Not on file  Social Connections: Not on file  Intimate Partner Violence: Not on file   Family History  Problem Relation Age of Onset   Heart failure Mother    Mental illness Sister    Mental illness Sister    ROS: All systems reviewed and negative except as per HPI.  Current Meds  Medication Sig   acetaminophen (TYLENOL) 500 MG tablet Take 2 tablets (1,000 mg total) by mouth 2 (two) times daily as needed.   albuterol (VENTOLIN HFA) 108 (90 Base) MCG/ACT inhaler Inhale 2 puffs into the lungs every 6 (six) hours as needed for wheezing or shortness of breath.   amoxicillin (AMOXIL) 500 MG capsule Take 4 capsules  (2,000 mg total) by mouth as directed. Take 4 tablets 1 hour prior to dental work, including cleanings.   atorvastatin (LIPITOR) 40 MG tablet Take 1 tablet (40 mg total) by mouth daily at 6 PM.   blood glucose meter kit and supplies KIT Dispense based on patient and insurance preference. Use up to four times daily as directed. (FOR ICD-9 250.00, 250.01).   carvedilol (COREG) 6.25 MG tablet Take 1 tablet (6.25 mg total) by mouth 2 (two) times daily with a meal.   clopidogrel (PLAVIX) 75 MG tablet Take 1 tablet (75 mg total) by mouth daily.   Continuous Blood Gluc Receiver (FREESTYLE LIBRE 14 DAY READER) DEVI 1 each by Does not apply route as needed.   Continuous Blood Gluc Sensor (FREESTYLE LIBRE 14 DAY SENSOR) MISC Use one applicator every 14 (fourteen) days.   cyclobenzaprine (FLEXERIL) 10 MG tablet Take 10 mg by mouth 3 (three) times daily as needed for muscle spasms.  empagliflozin (JARDIANCE) 10 MG TABS tablet Take 1 tablet (10 mg total) by mouth daily before breakfast.   ferrous sulfate 325 (65 FE) MG tablet Take 1 tablet (325 mg total) by mouth every other day.   furosemide (LASIX) 40 MG tablet Take 1.5 tablets (60 mg total) by mouth 2 (two) times daily.   gabapentin (NEURONTIN) 800 MG tablet Take 1 tablet (800 mg total) by mouth every 8 (eight) hours. (Patient taking differently: Take 800 mg by mouth every 8 (eight) hours. As needed)   glipiZIDE (GLUCOTROL) 10 MG tablet Take 1 tablet (10 mg total) by mouth 2 (two) times daily before a meal.   insulin glargine (LANTUS) 100 UNIT/ML injection Inject 0.2 mLs (20 Units total) into the skin daily.   Insulin Lispro Prot & Lispro (HUMALOG MIX 75/25 KWIKPEN) (75-25) 100 UNIT/ML Kwikpen Inject 30 Units into the skin 2 (two) times daily.   Insulin Pen Needle (TRUEPLUS 5-BEVEL PEN NEEDLES) 31G X 6 MM MISC Inject 1 pen into the skin 2 (two) times daily.   Insulin Syringe-Needle U-100 (TRUEPLUS INSULIN SYRINGE) 31G X 5/16" 0.3 ML MISC USE 3 TIMES DAILY TO  INJECT INSULIN   metFORMIN (GLUCOPHAGE) 500 MG tablet Take 1 tablet (500 mg total) by mouth 2 (two) times daily with a meal.   Multiple Vitamins-Minerals (CENTRUM SILVER 50+MEN) TABS Take 1 tablet by mouth daily.   pantoprazole (PROTONIX) 40 MG tablet Take 1 tablet (40 mg total) by mouth daily.   potassium chloride SA (KLOR-CON M) 20 MEQ tablet TAKE 2 TABLETS BY MOUTH ONCE DAILY   QUEtiapine (SEROQUEL) 300 MG tablet Take 1.5 tablets (450 mg total) by mouth at bedtime.   sacubitril-valsartan (ENTRESTO) 24-26 MG Take 1 tablet by mouth 2 (two) times daily.   Semaglutide (OZEMPIC, 0.25 OR 0.5 MG/DOSE, [place]) Inject 0.25 mg into the skin once a week.   spironolactone (ALDACTONE) 25 MG tablet Take 0.5 tablets (12.5 mg total) by mouth at bedtime.   tadalafil (CIALIS) 20 MG tablet Take 20 mg by mouth daily as needed for erectile dysfunction.   TRUEPLUS PEN NEEDLES 31G X 6 MM MISC USE AS DIRECTED   BP (!) 90/52    Pulse 86    Wt (!) 146 kg (321 lb 12.8 oz)    SpO2 94%    BMI 41.32 kg/m   Wt Readings from Last 3 Encounters:  12/26/21 (!) 146 kg (321 lb 12.8 oz)  12/20/21 (!) 145.2 kg (320 lb)  12/12/21 (!) 145 kg (319 lb 10.7 oz)   Current Outpatient Medications on File Prior to Encounter  Medication Sig Dispense Refill   acetaminophen (TYLENOL) 500 MG tablet Take 2 tablets (1,000 mg total) by mouth 2 (two) times daily as needed. 180 tablet 3   albuterol (VENTOLIN HFA) 108 (90 Base) MCG/ACT inhaler Inhale 2 puffs into the lungs every 6 (six) hours as needed for wheezing or shortness of breath. 18 g 1   amoxicillin (AMOXIL) 500 MG capsule Take 4 capsules (2,000 mg total) by mouth as directed. Take 4 tablets 1 hour prior to dental work, including cleanings. 12 capsule 12   atorvastatin (LIPITOR) 40 MG tablet Take 1 tablet (40 mg total) by mouth daily at 6 PM. 90 tablet 1   blood glucose meter kit and supplies KIT Dispense based on patient and insurance preference. Use up to four times daily as directed.  (FOR ICD-9 250.00, 250.01). 1 each 0   carvedilol (COREG) 6.25 MG tablet Take 1 tablet (6.25 mg total)  by mouth 2 (two) times daily with a meal. 60 tablet 3   clopidogrel (PLAVIX) 75 MG tablet Take 1 tablet (75 mg total) by mouth daily. 90 tablet 1   Continuous Blood Gluc Receiver (FREESTYLE LIBRE 14 DAY READER) DEVI 1 each by Does not apply route as needed. 1 each 11   Continuous Blood Gluc Sensor (FREESTYLE LIBRE 14 DAY SENSOR) MISC Use one applicator every 14 (fourteen) days. 6 each 3   cyclobenzaprine (FLEXERIL) 10 MG tablet Take 10 mg by mouth 3 (three) times daily as needed for muscle spasms.     empagliflozin (JARDIANCE) 10 MG TABS tablet Take 1 tablet (10 mg total) by mouth daily before breakfast. 30 tablet 6   ferrous sulfate 325 (65 FE) MG tablet Take 1 tablet (325 mg total) by mouth every other day. 30 tablet 1   furosemide (LASIX) 40 MG tablet Take 1.5 tablets (60 mg total) by mouth 2 (two) times daily. 90 tablet 3   gabapentin (NEURONTIN) 800 MG tablet Take 1 tablet (800 mg total) by mouth every 8 (eight) hours. (Patient taking differently: Take 800 mg by mouth every 8 (eight) hours. As needed) 270 tablet 1   glipiZIDE (GLUCOTROL) 10 MG tablet Take 1 tablet (10 mg total) by mouth 2 (two) times daily before a meal. 180 tablet 1   insulin glargine (LANTUS) 100 UNIT/ML injection Inject 0.2 mLs (20 Units total) into the skin daily. 20 mL 1   Insulin Lispro Prot & Lispro (HUMALOG MIX 75/25 KWIKPEN) (75-25) 100 UNIT/ML Kwikpen Inject 30 Units into the skin 2 (two) times daily. 54 mL 1   Insulin Pen Needle (TRUEPLUS 5-BEVEL PEN NEEDLES) 31G X 6 MM MISC Inject 1 pen into the skin 2 (two) times daily. 100 each 1   Insulin Syringe-Needle U-100 (TRUEPLUS INSULIN SYRINGE) 31G X 5/16" 0.3 ML MISC USE 3 TIMES DAILY TO INJECT INSULIN 300 each 3   metFORMIN (GLUCOPHAGE) 500 MG tablet Take 1 tablet (500 mg total) by mouth 2 (two) times daily with a meal. 180 tablet 1   Multiple Vitamins-Minerals (CENTRUM  SILVER 50+MEN) TABS Take 1 tablet by mouth daily.     pantoprazole (PROTONIX) 40 MG tablet Take 1 tablet (40 mg total) by mouth daily. 90 tablet 1   potassium chloride SA (KLOR-CON M) 20 MEQ tablet TAKE 2 TABLETS BY MOUTH ONCE DAILY 60 tablet 3   QUEtiapine (SEROQUEL) 300 MG tablet Take 1.5 tablets (450 mg total) by mouth at bedtime. 135 tablet 1   sacubitril-valsartan (ENTRESTO) 24-26 MG Take 1 tablet by mouth 2 (two) times daily. 180 tablet 3   Semaglutide (OZEMPIC, 0.25 OR 0.5 MG/DOSE, [place]) Inject 0.25 mg into the skin once a week.     spironolactone (ALDACTONE) 25 MG tablet Take 0.5 tablets (12.5 mg total) by mouth at bedtime. 45 tablet 3   tadalafil (CIALIS) 20 MG tablet Take 20 mg by mouth daily as needed for erectile dysfunction.     TRUEPLUS PEN NEEDLES 31G X 6 MM MISC USE AS DIRECTED 100 each 0   No current facility-administered medications on file prior to encounter.   BP (!) 90/52    Pulse 86    Wt (!) 146 kg (321 lb 12.8 oz)    SpO2 94%    BMI 41.32 kg/m   Wt Readings from Last 3 Encounters:  12/26/21 (!) 146 kg (321 lb 12.8 oz)  12/20/21 (!) 145.2 kg (320 lb)  12/12/21 (!) 145 kg (319 lb 10.7 oz)  Physical Exam: General:  NAD. No resp difficulty HEENT: Normal Neck: Supple. No JVD. Carotids 2+ bilat; no bruits. No lymphadenopathy or thryomegaly appreciated. Cor: PMI nondisplaced. Regular rate & rhythm. No rubs, gallops or murmurs. Lungs: Clear Abdomen: Obese, nontender, nondistended. No hepatosplenomegaly. No bruits or masses. Good bowel sounds. Extremities: No cyanosis, clubbing, rash, edema Neuro: Alert & oriented x 3, cranial nerves grossly intact. Moves all 4 extremities w/o difficulty. Affect pleasant.  Assessment/Plan: 1. Chronic systolic CHF with recovery in LV function: Echo in 11/20 with EF 25-30%, echo 5/21 with EF 30% with mildly decreased RV systolic function. Biotronik ICD.  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 Echo 8/22 with improvement in  LVEF to 50-55%. No longer drinking ETOH. TEE (1/23) EF 50-55%. NYHA class II. He is not volume overloaded today.  - Continue Lasix 60 mg bid. BMET/BNP today. - Continue Coreg 6.25 mg bid.   - Continue Entresto 24/26 mg bid. BP a little soft today, but no dizziness and BPs have been OK with paramedicine checks. - Continue spiro 12.5 mg daily.  - Continue Jardiance 10 mg daily. 2. 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 Enteroscopy 8/22 with mild gastritis, three non bleeding angioectasias in duodenum treated with APC and 1 clip. No further bleeding issues. - Iron deficient, continue iron suppl. - Continue PPI.  - He has not used ETOH. 3. Paroxysmal atrial fibrillation: Regular on exam today. CHADS2-VASc score = 4 (CAD, CHF, HTN, DM).  Off Eliquis with frequent GIB. - Now s/p Watchman 12/22. - On Plavix. 4. CAD: Last cath 11/20220 with occluded OM2 stent, 90% small OM1, diffuse disease RCA treated medically.   - Currently no s/s angina. - On statin.  - If no further bleeding, favor restarting ASA 81 daily after he has stopped his Plavix (discussed with Dr. Aundra Dubin). 5. HTN: On the low side today. Monitor with paramedicine. 6. OSA: CPAP machine dated and does not work. Needs updated sleep study, insurance denied home sleep study. Will arrange study in lab. 7. CKD 3a: BMET today. 8. Obesity: Body mass index is 41.32 kg/m. - He is now on semaglutide.  Follow up in 3 months with Dr. Aundra Dubin.  Allena Katz, FNP-BC 12/26/21

## 2021-12-27 ENCOUNTER — Other Ambulatory Visit (HOSPITAL_COMMUNITY): Payer: Self-pay

## 2021-12-27 NOTE — Progress Notes (Signed)
Paramedicine Encounter    Patient ID: [name], male    DOB: [birth year], 61 y.o.   MRN: [id number]  Arrived for [name] at his home where we were reviewing medications and filling his pillbox for the week.   Meds were confirmed. Pill box filled missing a few medications as he has not picked them up from [place] yet.   Note left with [name] and explained in detail of how to place and where to place these meds once picked up. He understood and agreed.   I spoke to [facility] and confirmed medications are ready and copays. Carvedilol $0 Metformin $0 Lasix $0 Seroquel $6 Potassium $6 Glipizide $0 Jardiance $37   [name] plans to pick up on Friday when he gets paid.   Home visit complete. I will see [name] in one week.   [place] filled out and I will have [place] assist in faxing same.   ACTION: Home visit completed

## 2021-12-28 ENCOUNTER — Telehealth (HOSPITAL_COMMUNITY): Payer: Self-pay | Admitting: Licensed Clinical Social Worker

## 2021-12-28 ENCOUNTER — Telehealth (HOSPITAL_COMMUNITY): Payer: Self-pay

## 2021-12-28 NOTE — Telephone Encounter (Signed)
HF Paramedicine Team Based Care Meeting  HF MD- NA  HF NP - [place] NP-C   [facility] admit within the last 30 days for heart failure? no  Medications concerns? Not capable of managing his own meds- limited literacy  Education needs? Manages diet ok- some slips but overall compliant  Eligible for discharge? Have discussed possible transfer to pharmacy for bubble packs but won't do until medications stable  [name], [facility] Desk#: [phone] Cell#: [phone]

## 2021-12-28 NOTE — Telephone Encounter (Signed)
[employer] Assistance paperwork filled out and will be placed in mail today.

## 2022-01-03 ENCOUNTER — Other Ambulatory Visit (HOSPITAL_COMMUNITY): Payer: Self-pay

## 2022-01-03 NOTE — Progress Notes (Signed)
Paramedicine Encounter    Patient ID: [name], male    DOB: [birth year], 61 y.o.   MRN: [id number]  Arrived for home visit for [name] who was alert and oriented reporting to be feeling good with no complaints. He was med compliant with medications over the last week. He denied chest pain, shortness of breath, dizziness or trouble sleeping. Assessment and vitals obtained.  He denied any recent bleeding. CBG-[id number]  Ozempic given.  Meds reviewed and confirmed, pill box filled accordingly. Some meds missing, [facility] called and they are filling them for [name] to pick up. Instructions left with him to place meds in box.   We reviewed appointments and confirmed same.   Refills: Entresto Lasix Carvedilol Gabapentin Potassium   I will see [name] in one week. Home visit complete.   Patient Care Team: [name], NP as PCP - General (Adult Health Nurse Practitioner) [name], MD as PCP - Cardiology (Cardiology) [name], MD as PCP - Advanced Heart Failure (Cardiology) [name], MD as PCP - Electrophysiology (Cardiology) [name], LCSW as Social Worker (Licensed Clinical Social Worker) [name], MD as Consulting Physician (Gastroenterology)  Patient Active Problem List   Diagnosis Date Noted   Presence of Watchman left atrial appendage closure device 10/28/2021   Atrial fibrillation ([place]) 10/27/2021   Melena    Chronic diastolic CHF (congestive heart failure) ([name])    GI bleed 06/29/2021   Severe anemia 06/29/2021   Adenomatous polyp of ascending colon    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   Acute blood loss anemia    Angiodysplasia of stomach    Chronic anticoagulation    CHF exacerbation ([place]) 06/15/2020   AKI (acute kidney injury) ([place]) 06/15/2020   CKD (chronic kidney disease), stage III ([place]) 06/15/2020   Anemia due to chronic blood loss    Gastric AVM    Angiodysplasia of duodenum with  hemorrhage    Acute on chronic systolic (congestive) heart failure ([place]) 04/05/2020   Anemia 04/05/2020   PAF (paroxysmal atrial fibrillation) ([place]) 04/05/2020   OSA on CPAP 04/05/2020   Syncope and collapse 04/05/2020   Type 2 diabetes mellitus with hyperglycemia, with long-term current use of insulin ([place]) 12/03/2019   Diabetic polyneuropathy associated with type 2 diabetes mellitus ([place]) 12/03/2019   Erectile dysfunction 12/03/2019   Paranoid schizophrenia, chronic condition ([place]) 01/26/2015   Severe recurrent major depressive disorder with psychotic features ([place]) 01/26/2015   GAD (generalized anxiety disorder) 01/26/2015   OCD (obsessive compulsive disorder) 01/26/2015   Panic disorder without agoraphobia 01/26/2015   Insomnia 01/26/2015    Current Outpatient Medications:    acetaminophen (TYLENOL) 500 MG tablet, Take 2 tablets (1,000 mg total) by mouth 2 (two) times daily as needed., Disp: 180 tablet, Rfl: 3   albuterol (VENTOLIN HFA) 108 (90 Base) MCG/ACT inhaler, Inhale 2 puffs into the lungs every 6 (six) hours as needed for wheezing or shortness of breath., Disp: 18 g, Rfl: 1   amoxicillin (AMOXIL) 500 MG capsule, Take 4 capsules (2,000 mg total) by mouth as directed. Take 4 tablets 1 hour prior to dental work, including cleanings., Disp: 12 capsule, Rfl: 12   atorvastatin (LIPITOR) 40 MG tablet, Take 1 tablet (40 mg total) by mouth daily at 6 PM., Disp: 90 tablet, Rfl: 1   blood glucose meter kit and supplies KIT, Dispense based on patient and insurance preference. Use up to four times daily as directed. (FOR ICD-9 250.00, 250.01).,  Disp: 1 each, Rfl: 0   carvedilol (COREG) 6.25 MG tablet, Take 1 tablet (6.25 mg total) by mouth 2 (two) times daily with a meal., Disp: 60 tablet, Rfl: 3   clopidogrel (PLAVIX) 75 MG tablet, Take 1 tablet (75 mg total) by mouth daily., Disp: 90 tablet, Rfl: 1   Continuous Blood Gluc Receiver (FREESTYLE LIBRE [place]) DEVI, 1 each by Does not  apply route as needed., Disp: 1 each, Rfl: 11   Continuous Blood Gluc Sensor (FREESTYLE LIBRE 14 DAY SENSOR) MISC, Use one applicator every 14 (fourteen) days., Disp: 6 each, Rfl: 3   cyclobenzaprine (FLEXERIL) 10 MG tablet, Take 10 mg by mouth 3 (three) times daily as needed for muscle spasms., Disp: , Rfl:    empagliflozin (JARDIANCE) 10 MG TABS tablet, Take 1 tablet (10 mg total) by mouth daily before breakfast., Disp: 30 tablet, Rfl: 6   ferrous sulfate 325 (65 FE) MG tablet, Take 1 tablet (325 mg total) by mouth every other day., Disp: 30 tablet, Rfl: 1   furosemide (LASIX) 40 MG tablet, Take 1.5 tablets (60 mg total) by mouth 2 (two) times daily., Disp: 90 tablet, Rfl: 3   gabapentin (NEURONTIN) 800 MG tablet, Take 1 tablet (800 mg total) by mouth every 8 (eight) hours., Disp: 270 tablet, Rfl: 1   glipiZIDE (GLUCOTROL) 10 MG tablet, Take 1 tablet (10 mg total) by mouth 2 (two) times daily before a meal., Disp: 180 tablet, Rfl: 1   insulin glargine (LANTUS) 100 UNIT/ML injection, Inject 0.2 mLs (20 Units total) into the skin daily., Disp: 20 mL, Rfl: 1   Insulin Lispro Prot & Lispro (HUMALOG MIX 75/25 KWIKPEN) (75-25) 100 UNIT/ML Kwikpen, Inject 30 Units into the skin 2 (two) times daily., Disp: 54 mL, Rfl: 1   Insulin Pen Needle (TRUEPLUS 5-BEVEL PEN NEEDLES) 31G X 6 MM MISC, Inject 1 pen into the skin 2 (two) times daily., Disp: 100 each, Rfl: 1   Insulin Syringe-Needle U-100 (TRUEPLUS INSULIN SYRINGE) 31G X 5/16" 0.3 ML MISC, USE 3 TIMES DAILY TO INJECT INSULIN, Disp: 300 each, Rfl: 3   metFORMIN (GLUCOPHAGE) 500 MG tablet, Take 1 tablet (500 mg total) by mouth 2 (two) times daily with a meal., Disp: 180 tablet, Rfl: 1   Multiple Vitamins-Minerals (CENTRUM SILVER 50+MEN) TABS, Take 1 tablet by mouth daily., Disp: , Rfl:    pantoprazole (PROTONIX) 40 MG tablet, Take 1 tablet (40 mg total) by mouth daily., Disp: 90 tablet, Rfl: 1   potassium chloride SA (KLOR-CON M) 20 MEQ tablet, TAKE 2 TABLETS  BY MOUTH ONCE DAILY, Disp: 60 tablet, Rfl: 3   QUEtiapine (SEROQUEL) 300 MG tablet, Take 1.5 tablets (450 mg total) by mouth at bedtime., Disp: 135 tablet, Rfl: 1   sacubitril-valsartan (ENTRESTO) 24-26 MG, Take 1 tablet by mouth 2 (two) times daily., Disp: 180 tablet, Rfl: 3   Semaglutide (OZEMPIC, 0.25 OR 0.5 MG/DOSE, [place]), Inject 0.25 mg into the skin once a week., Disp: , Rfl:    spironolactone (ALDACTONE) 25 MG tablet, Take 0.5 tablets (12.5 mg total) by mouth at bedtime., Disp: 45 tablet, Rfl: 3   tadalafil (CIALIS) 20 MG tablet, Take 20 mg by mouth daily as needed for erectile dysfunction., Disp: , Rfl:    TRUEPLUS PEN NEEDLES 31G X 6 MM MISC, USE AS DIRECTED, Disp: 100 each, Rfl: 0 Not on File   Social History   Socioeconomic History   Marital status: Legally Separated    Spouse name: Not on  file   Number of children: Not on file   Years of education: Not on file   Highest education level: Not on file  Occupational History   Not on file  Tobacco Use   Smoking status: Former   Smokeless tobacco: Never  Vaping Use   Vaping Use: Never used  Substance and Sexual Activity   Alcohol use: Not Currently    Alcohol/week: 2.0 standard drinks    Types: 2 Cans of beer per week   Drug use: No   Sexual activity: Yes  Other Topics Concern   Not on file  Social History Narrative   Not on file   Social Determinants of Health   Financial Resource Strain: Not on file  Food Insecurity: No Food Insecurity   Worried About Charity fundraiser in the Last Year: Never true   Ran Out of Food in the Last Year: Never true  Transportation Needs: Unmet Transportation Needs   Lack of Transportation (Medical): Yes   Lack of Transportation (Non-Medical): Yes  Physical Activity: Not on file  Stress: Not on file  Social Connections: Not on file  Intimate Partner Violence: Not on file    Physical Exam Vitals reviewed.  Constitutional:      Appearance: Normal appearance. He is normal weight.   HENT:     Head: Normocephalic.     Nose: Nose normal.     Mouth/Throat:     Mouth: Mucous membranes are moist.     Pharynx: Oropharynx is clear.  Eyes:     Pupils: Pupils are equal, round, and reactive to light.  Cardiovascular:     Rate and Rhythm: Normal rate and regular rhythm.     Pulses: Normal pulses.     Heart sounds: Normal heart sounds.  Pulmonary:     Effort: Pulmonary effort is normal.     Breath sounds: Normal breath sounds.  Abdominal:     General: Abdomen is flat.     Palpations: Abdomen is soft.  Musculoskeletal:        General: No swelling. Normal range of motion.     Cervical back: Normal range of motion.     Right lower leg: No edema.     Left lower leg: No edema.  Skin:    General: Skin is warm and dry.     Capillary Refill: Capillary refill takes less than 2 seconds.  Neurological:     General: No focal deficit present.     Mental Status: He is alert. Mental status is at baseline.  Psychiatric:        Mood and Affect: Mood normal.        Future Appointments  Date Time Provider [place]  02/02/2022 11:00 AM [name], NP [place] None  03/03/2022  7:40 AM [facility] DEVICE REMOTES [facility]  03/30/2022  1:20 PM [name], NP [place] None  03/31/2022  3:00 PM [name], MD [facility] None  04/26/2022  1:30 PM [facility] STRUCTURAL HEART APP [facility]  06/02/2022  7:40 AM [facility] DEVICE REMOTES [facility]  09/01/2022  7:40 AM [facility] DEVICE REMOTES [facility]  12/01/2022  7:40 AM [facility] DEVICE REMOTES [facility]  03/02/2023  7:40 AM [facility] DEVICE REMOTES [facility]  06/01/2023  7:40 AM [facility] DEVICE REMOTES [facility]     ACTION: Home visit completed

## 2022-01-04 ENCOUNTER — Telehealth (HOSPITAL_COMMUNITY): Payer: Self-pay

## 2022-01-04 NOTE — Telephone Encounter (Signed)
Spoke to [employer] and confirmed Entresto 24/26 delivery for 01/09/22.   Next refill date is 04/09/22 to call 7-10 days before.   Call complete.

## 2022-01-10 ENCOUNTER — Other Ambulatory Visit (HOSPITAL_COMMUNITY): Payer: Self-pay

## 2022-01-10 NOTE — Progress Notes (Signed)
Paramedicine Encounter    Patient ID: [name], male    DOB: [birth year], 61 y.o.   MRN: [id number]  Arrived at [name] for a home visit for med rec. I reviewed all medications and filled two pill boxes for two weeks. No refills needed. Appointments reviewed and confirmed. Visit complete.    ACTION: Home visit completed

## 2022-01-24 ENCOUNTER — Other Ambulatory Visit (HOSPITAL_COMMUNITY): Payer: Self-pay

## 2022-01-24 NOTE — Progress Notes (Signed)
Paramedicine Encounter    Patient ID: [name], male    DOB: 1961/07/28, 61 y.o.   MRN: [id number]  Arrived for visit for [facility]. He was outside working on his car. I advised I would complete a med rec this week and we can complete a full visit next week. He agreed.   Meds verified and confirmed. Pill box filled for one week.   Refills: Lantus Spironolactone    ACTION: Home visit completed

## 2022-01-31 ENCOUNTER — Other Ambulatory Visit (HOSPITAL_COMMUNITY): Payer: Self-pay

## 2022-01-31 NOTE — Progress Notes (Signed)
Paramedicine Encounter    Patient ID: [name], male    DOB: 1961-11-04, 61 y.o.   MRN: [id number]  Arrived for home visit for [name] who reports feeling good with no complaints today. He denied chest pain, shortness of breath, dizziness or edema. No lower leg swelling noted. Lungs clear. Vitals obtained as noted. He denied any blood in stool or urine. No abdominal pain or distention.   I reviewed medications and filled pill box for two weeks. Refills as noted below.   I reviewed appointments and confirmed same with him writing these down for him.   Home visit complete. I will see [name] in two weeks.   Refills: Carvedilol Lasix Potassium Tadalifil   CBG- 187   2nd pill box needs: Carvedilol- 1 pill  SAT, SUN, MON, (MORN/EVE)  Lasix- 1.5 pills  FRI (EVE) SAT (MORN/EVE) SUN (MORN/EVE) MON (MORN/EVE) TUES (MORN)  Potassium- 2 pills FRI< SAT< SUN< MON< TUES (MORN)      Patient Care Team: [name], NP as PCP - General (Adult Health Nurse Practitioner) [name], MD as PCP - Cardiology (Cardiology) [name], MD as PCP - Advanced Heart Failure (Cardiology) [name], MD as PCP - Electrophysiology (Cardiology) [name], LCSW as Social Worker (Licensed Clinical Social Worker) [name], MD as Consulting Physician (Gastroenterology)  Patient Active Problem List   Diagnosis Date Noted   Presence of Watchman left atrial appendage closure device 10/28/2021   Atrial fibrillation ([place]) 10/27/2021   Melena    Chronic diastolic CHF (congestive heart failure) ([place])    GI bleed 06/29/2021   Severe anemia 06/29/2021   Adenomatous polyp of ascending colon    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   Acute blood loss anemia    Angiodysplasia of stomach    Chronic anticoagulation    CHF exacerbation ([place]) 06/15/2020   AKI (acute kidney injury) ([place]) 06/15/2020   CKD (chronic kidney disease), stage III  ([place]) 06/15/2020   Anemia due to chronic blood loss    Gastric AVM    Angiodysplasia of duodenum with hemorrhage    Acute on chronic systolic (congestive) heart failure ([place]) 04/05/2020   Anemia 04/05/2020   PAF (paroxysmal atrial fibrillation) ([place]) 04/05/2020   OSA on CPAP 04/05/2020   Syncope and collapse 04/05/2020   Type 2 diabetes mellitus with hyperglycemia, with long-term current use of insulin ([place]) 12/03/2019   Diabetic polyneuropathy associated with type 2 diabetes mellitus ([place]) 12/03/2019   Erectile dysfunction 12/03/2019   Paranoid schizophrenia, chronic condition ([place]) 01/26/2015   Severe recurrent major depressive disorder with psychotic features ([place]) 01/26/2015   GAD (generalized anxiety disorder) 01/26/2015   OCD (obsessive compulsive disorder) 01/26/2015   Panic disorder without agoraphobia 01/26/2015   Insomnia 01/26/2015    Current Outpatient Medications:    acetaminophen (TYLENOL) 500 MG tablet, Take 2 tablets (1,000 mg total) by mouth 2 (two) times daily as needed., Disp: 180 tablet, Rfl: 3   albuterol (VENTOLIN HFA) 108 (90 Base) MCG/ACT inhaler, Inhale 2 puffs into the lungs every 6 (six) hours as needed for wheezing or shortness of breath., Disp: 18 g, Rfl: 1   amoxicillin (AMOXIL) 500 MG capsule, Take 4 capsules (2,000 mg total) by mouth as directed. Take 4 tablets 1 hour prior to dental work, including cleanings., Disp: 12 capsule, Rfl: 12   atorvastatin (LIPITOR) 40 MG tablet, Take 1 tablet (40 mg total) by mouth daily at 6 PM., Disp: 90 tablet, Rfl: 1  blood glucose meter kit and supplies KIT, Dispense based on patient and insurance preference. Use up to four times daily as directed. (FOR ICD-9 250.00, 250.01)., Disp: 1 each, Rfl: 0   carvedilol (COREG) 6.25 MG tablet, Take 1 tablet (6.25 mg total) by mouth 2 (two) times daily with a meal., Disp: 60 tablet, Rfl: 3   clopidogrel (PLAVIX) 75 MG tablet, Take 1 tablet (75 mg total) by mouth daily., Disp: 90  tablet, Rfl: 1   Continuous Blood Gluc Receiver (FREESTYLE LIBRE [place]) DEVI, 1 each by Does not apply route as needed., Disp: 1 each, Rfl: 11   Continuous Blood Gluc Sensor (FREESTYLE LIBRE 14 DAY SENSOR) MISC, Use one applicator every 14 (fourteen) days., Disp: 6 each, Rfl: 3   cyclobenzaprine (FLEXERIL) 10 MG tablet, Take 10 mg by mouth 3 (three) times daily as needed for muscle spasms., Disp: , Rfl:    empagliflozin (JARDIANCE) 10 MG TABS tablet, Take 1 tablet (10 mg total) by mouth daily before breakfast., Disp: 30 tablet, Rfl: 6   ferrous sulfate 325 (65 FE) MG tablet, Take 1 tablet (325 mg total) by mouth every other day., Disp: 30 tablet, Rfl: 1   furosemide (LASIX) 40 MG tablet, Take 1.5 tablets (60 mg total) by mouth 2 (two) times daily., Disp: 90 tablet, Rfl: 3   gabapentin (NEURONTIN) 800 MG tablet, Take 1 tablet (800 mg total) by mouth every 8 (eight) hours., Disp: 270 tablet, Rfl: 1   glipiZIDE (GLUCOTROL) 10 MG tablet, Take 1 tablet (10 mg total) by mouth 2 (two) times daily before a meal., Disp: 180 tablet, Rfl: 1   insulin glargine (LANTUS) 100 UNIT/ML injection, Inject 0.2 mLs (20 Units total) into the skin daily., Disp: 20 mL, Rfl: 1   Insulin Lispro Prot & Lispro (HUMALOG MIX 75/25 KWIKPEN) (75-25) 100 UNIT/ML Kwikpen, Inject 30 Units into the skin 2 (two) times daily., Disp: 54 mL, Rfl: 1   Insulin Pen Needle (TRUEPLUS 5-BEVEL PEN NEEDLES) 31G X 6 MM MISC, Inject 1 pen into the skin 2 (two) times daily., Disp: 100 each, Rfl: 1   Insulin Syringe-Needle U-100 (TRUEPLUS INSULIN SYRINGE) 31G X 5/16" 0.3 ML MISC, USE 3 TIMES DAILY TO INJECT INSULIN, Disp: 300 each, Rfl: 3   metFORMIN (GLUCOPHAGE) 500 MG tablet, Take 1 tablet (500 mg total) by mouth 2 (two) times daily with a meal., Disp: 180 tablet, Rfl: 1   Multiple Vitamins-Minerals (CENTRUM SILVER 50+MEN) TABS, Take 1 tablet by mouth daily., Disp: , Rfl:    pantoprazole (PROTONIX) 40 MG tablet, Take 1 tablet (40 mg total) by  mouth daily., Disp: 90 tablet, Rfl: 1   potassium chloride SA (KLOR-CON M) 20 MEQ tablet, TAKE 2 TABLETS BY MOUTH ONCE DAILY, Disp: 60 tablet, Rfl: 3   QUEtiapine (SEROQUEL) 300 MG tablet, Take 1.5 tablets (450 mg total) by mouth at bedtime., Disp: 135 tablet, Rfl: 1   sacubitril-valsartan (ENTRESTO) 24-26 MG, Take 1 tablet by mouth 2 (two) times daily., Disp: 180 tablet, Rfl: 3   Semaglutide (OZEMPIC, 0.25 OR 0.5 MG/DOSE, [place]), Inject 0.25 mg into the skin once a week., Disp: , Rfl:    spironolactone (ALDACTONE) 25 MG tablet, Take 0.5 tablets (12.5 mg total) by mouth at bedtime., Disp: 45 tablet, Rfl: 3   tadalafil (CIALIS) 20 MG tablet, Take 20 mg by mouth daily as needed for erectile dysfunction., Disp: , Rfl:    TRUEPLUS PEN NEEDLES 31G X 6 MM MISC, USE AS DIRECTED, Disp: 100 each,  Rfl: 0 Not on File   Social History   Socioeconomic History   Marital status: Legally Separated    Spouse name: Not on file   Number of children: Not on file   Years of education: Not on file   Highest education level: Not on file  Occupational History   Not on file  Tobacco Use   Smoking status: Former   Smokeless tobacco: Never  Vaping Use   Vaping Use: Never used  Substance and Sexual Activity   Alcohol use: Not Currently    Alcohol/week: 2.0 standard drinks    Types: 2 Cans of beer per week   Drug use: No   Sexual activity: Yes  Other Topics Concern   Not on file  Social History Narrative   Not on file   Social Determinants of Health   Financial Resource Strain: Not on file  Food Insecurity: No Food Insecurity   Worried About Charity fundraiser in the Last Year: Never true   Ran Out of Food in the Last Year: Never true  Transportation Needs: Unmet Transportation Needs   Lack of Transportation (Medical): Yes   Lack of Transportation (Non-Medical): Yes  Physical Activity: Not on file  Stress: Not on file  Social Connections: Not on file  Intimate Partner Violence: Not on file     Physical Exam Vitals reviewed.  Constitutional:      Appearance: Normal appearance. He is obese.  HENT:     Head: Normocephalic.     Nose: Nose normal.     Mouth/Throat:     Mouth: Mucous membranes are moist.     Pharynx: Oropharynx is clear.  Eyes:     Conjunctiva/sclera: Conjunctivae normal.     Pupils: Pupils are equal, round, and reactive to light.  Cardiovascular:     Rate and Rhythm: Normal rate and regular rhythm.     Pulses: Normal pulses.     Heart sounds: Normal heart sounds.  Pulmonary:     Effort: Pulmonary effort is normal.     Breath sounds: Normal breath sounds.  Abdominal:     Palpations: Abdomen is soft.  Musculoskeletal:        General: No swelling. Normal range of motion.     Cervical back: Normal range of motion.     Right lower leg: No edema.     Left lower leg: No edema.  Skin:    General: Skin is warm and dry.     Capillary Refill: Capillary refill takes less than 2 seconds.  Neurological:     General: No focal deficit present.     Mental Status: He is alert. Mental status is at baseline.  Psychiatric:        Mood and Affect: Mood normal.        Future Appointments  Date Time Provider [place]  02/02/2022 11:00 AM [name], NP [place] None  03/03/2022  7:40 AM [facility] DEVICE REMOTES [facility]  03/30/2022  1:20 PM [name], NP [place] None  03/31/2022  3:00 PM [name], MD [facility] None  04/26/2022  1:30 PM [facility] STRUCTURAL HEART APP [facility]  06/02/2022  7:40 AM [facility] DEVICE REMOTES [facility]  09/01/2022  7:40 AM [facility] DEVICE REMOTES [facility]  12/01/2022  7:40 AM [facility] DEVICE REMOTES [facility]  03/02/2023  7:40 AM [facility] DEVICE REMOTES [facility]  06/01/2023  7:40 AM [facility] DEVICE REMOTES [facility]     ACTION: Home visit completed

## 2022-02-01 ENCOUNTER — Telehealth (HOSPITAL_COMMUNITY): Payer: Self-pay | Admitting: Licensed Clinical Social Worker

## 2022-02-02 ENCOUNTER — Ambulatory Visit (INDEPENDENT_AMBULATORY_CARE_PROVIDER_SITE_OTHER): Payer: Medicare Other | Admitting: Nurse Practitioner

## 2022-02-02 ENCOUNTER — Other Ambulatory Visit: Payer: Self-pay

## 2022-02-02 ENCOUNTER — Encounter: Payer: Self-pay | Admitting: Nurse Practitioner

## 2022-02-02 VITALS — BP 129/84 | HR 84 | Temp 98.1°F | Ht 74.0 in | Wt 316.0 lb

## 2022-02-02 DIAGNOSIS — K5900 Constipation, unspecified: Secondary | ICD-10-CM | POA: Diagnosis not present

## 2022-02-02 DIAGNOSIS — E1165 Type 2 diabetes mellitus with hyperglycemia: Secondary | ICD-10-CM

## 2022-02-02 DIAGNOSIS — I5032 Chronic diastolic (congestive) heart failure: Secondary | ICD-10-CM

## 2022-02-02 DIAGNOSIS — Z9989 Dependence on other enabling machines and devices: Secondary | ICD-10-CM

## 2022-02-02 DIAGNOSIS — J301 Allergic rhinitis due to pollen: Secondary | ICD-10-CM

## 2022-02-02 DIAGNOSIS — G4733 Obstructive sleep apnea (adult) (pediatric): Secondary | ICD-10-CM

## 2022-02-02 DIAGNOSIS — Z794 Long term (current) use of insulin: Secondary | ICD-10-CM

## 2022-02-02 MED ORDER — LUBIPROSTONE 24 MCG PO CAPS: 24.0000 ug | ORAL_CAPSULE | Freq: Two times a day (BID) | ORAL | 0 refills | Status: AC

## 2022-02-02 MED ORDER — OLOPATADINE HCL 0.1 % OP SOLN: 1.0000 [drp] | Freq: Two times a day (BID) | OPHTHALMIC | 12 refills | Status: DC

## 2022-02-02 NOTE — Progress Notes (Signed)
[place], [place]  [address] Phone:  [phone]   Fax:  [phone]   Established Patient Office Visit  Subjective:  Patient ID: [name], male    DOB: 04-Sep-1961  Age: 61 y.o. MRN: [id number]  CC:  Chief Complaint  Patient presents with   Follow-up    Patient is here today for his follow up visit with no issues or concerns.    HPI [name] presents for follow up. He  has a past medical history of AICD (automatic cardioverter/defibrillator) present, Anxiety, Atrial fibrillation ([place]), CAD (coronary artery disease), Cardiomyopathy ([place]), CHF (congestive heart failure) ([place]) (09/2019), Depression, Diabetes mellitus, Erectile dysfunction (11/2019), GI bleeding, H/O right heart catheterization (09/2019), Hypertension, Presence of permanent cardiac pacemaker, Presence of Watchman left atrial appendage closure device (10/27/2021), Schizophrenia ([place]), and Sleep apnea.   Mr. Maberry is in today for diabetes follow up. The prescribed treatment is Jardiance 10 mg, glipizide 10 mg and Lantus 20 units daily and metformin along with statin therapy atorvastatin. The reported use of treatment is consistent with prescribed. There are no reported side effects from the treatment. Home glucose monitoring indicates a CBG range of   115, 126  to  200. Denies fever, chills, headache, dizziness, visual changes, polydipsia,  polyphagia shortness of breath, dyspnea on exertion, chest pain, abdominal pain, nausea, vomiting, polyuria, constipation, diarrhea,  any edema, numbness, tingling, burning of hand or feet. There has been a professional eye exam in the year.      Past Medical History:  Diagnosis Date   AICD (automatic cardioverter/defibrillator) present    Anxiety    Atrial fibrillation (HCC)    CAD (coronary artery disease)    Cardiomyopathy (HCC)    CHF (congestive heart failure) ([place]) 09/2019   Depression    Diabetes mellitus     Erectile dysfunction 11/2019   GI bleeding    H/O right heart catheterization 09/2019   Hypertension    Presence of permanent cardiac pacemaker    Presence of Watchman left atrial appendage closure device 10/27/2021   27 mm Watchman Flex Device per Dr. Quentin Ore   Schizophrenia [facility])    Sleep apnea    uses cpap    Past Surgical History:  Procedure Laterality Date   BIOPSY  04/19/2021   Procedure: BIOPSY;  Surgeon: Doran Stabler, MD;  Location: [facility] ENDOSCOPY;  Service: Gastroenterology;;   COLONOSCOPY WITH PROPOFOL N/A 04/16/2021   Procedure: COLONOSCOPY WITH PROPOFOL;  Surgeon: [name], MD;  Location: [place];  Service: Gastroenterology;  Laterality: N/A;   CORONARY STENT PLACEMENT     ENTEROSCOPY N/A 04/08/2020   Procedure: ENTEROSCOPY;  Surgeon: Doran Stabler, MD;  Location: [place];  Service: Gastroenterology;  Laterality: N/A;   ENTEROSCOPY N/A 06/17/2020   Procedure: ENTEROSCOPY;  Surgeon: Irene Shipper, MD;  Location: [facility] ENDOSCOPY;  Service: Endoscopy;  Laterality: N/A;   ENTEROSCOPY N/A 04/15/2021   Procedure: ENTEROSCOPY;  Surgeon: [name], MD;  Location: [place];  Service: Gastroenterology;  Laterality: N/A;   ENTEROSCOPY N/A 04/19/2021   Procedure: ENTEROSCOPY;  Surgeon: Doran Stabler, MD;  Location: [place];  Service: Gastroenterology;  Laterality: N/A;   ENTEROSCOPY N/A 07/01/2021   Procedure: ENTEROSCOPY;  Surgeon: Jerene Bears, MD;  Location: [facility] ENDOSCOPY;  Service: Gastroenterology;  Laterality: N/A;   GIVENS CAPSULE STUDY  04/16/2021   Procedure: GIVENS CAPSULE STUDY;  Surgeon: [name], MD;  Location: [place];  Service: Gastroenterology;;   HEMOSTASIS CLIP PLACEMENT  04/08/2020   Procedure: HEMOSTASIS CLIP PLACEMENT;  Surgeon: Doran Stabler, MD;  Location: [place];  Service: Gastroenterology;;   HEMOSTASIS CLIP PLACEMENT  07/01/2021   Procedure: HEMOSTASIS CLIP PLACEMENT;  Surgeon: Jerene Bears, MD;   Location: [facility] ENDOSCOPY;  Service: Gastroenterology;;   HEMOSTASIS CONTROL  04/08/2020   Procedure: HEMOSTASIS CONTROL;  Surgeon: Doran Stabler, MD;  Location: [facility] ENDOSCOPY;  Service: Gastroenterology;;   HEMOSTASIS CONTROL  06/17/2020   Procedure: HEMOSTASIS CONTROL;  Surgeon: Irene Shipper, MD;  Location: [place];  Service: Endoscopy;;   HERNIA REPAIR     HOT HEMOSTASIS N/A 04/15/2021   Procedure: HOT HEMOSTASIS (ARGON PLASMA COAGULATION/BICAP);  Surgeon: [name], MD;  Location: [place];  Service: Gastroenterology;  Laterality: N/A;   HOT HEMOSTASIS N/A 07/01/2021   Procedure: HOT HEMOSTASIS (ARGON PLASMA COAGULATION/BICAP);  Surgeon: Jerene Bears, MD;  Location: [facility] ENDOSCOPY;  Service: Gastroenterology;  Laterality: N/A;   ICD IMPLANT N/A 09/03/2020   Procedure: ICD IMPLANT;  Surgeon: [name], MD;  Location: [place] CV LAB;  Service: Cardiovascular;  Laterality: N/A;   LEFT ATRIAL APPENDAGE OCCLUSION N/A 10/27/2021   Procedure: LEFT ATRIAL APPENDAGE OCCLUSION;  Surgeon: [name], MD;  Location: [place] CV LAB;  Service: Cardiovascular;  Laterality: N/A;   POLYPECTOMY  04/16/2021   Procedure: POLYPECTOMY;  Surgeon: [name], MD;  Location: [facility] ENDOSCOPY;  Service: Gastroenterology;;   RIGHT/LEFT HEART CATH AND CORONARY ANGIOGRAPHY N/A 10/14/2019   Procedure: RIGHT/LEFT HEART CATH AND CORONARY ANGIOGRAPHY;  Surgeon: Belva Crome, MD;  Location: [place] CV LAB;  Service: Cardiovascular;  Laterality: N/A;   SUBMUCOSAL TATTOO INJECTION  04/19/2021   Procedure: SUBMUCOSAL TATTOO INJECTION;  Surgeon: Doran Stabler, MD;  Location: [place];  Service: Gastroenterology;;   TEE WITHOUT CARDIOVERSION N/A 10/27/2021   Procedure: TRANSESOPHAGEAL ECHOCARDIOGRAM (TEE);  Surgeon: [name], MD;  Location: [place] CV LAB;  Service: Cardiovascular;  Laterality: N/A;   TEE WITHOUT CARDIOVERSION N/A 12/12/2021   Procedure:  TRANSESOPHAGEAL ECHOCARDIOGRAM (TEE);  Surgeon: Geralynn Rile, MD;  Location: [place];  Service: Cardiovascular;  Laterality: N/A;    Family History  Problem Relation Age of Onset   Heart failure Mother    Mental illness Sister    Mental illness Sister     Social History   Socioeconomic History   Marital status: Legally Separated    Spouse name: Not on file   Number of children: Not on file   Years of education: Not on file   Highest education level: Not on file  Occupational History   Not on file  Tobacco Use   Smoking status: Former   Smokeless tobacco: Never  Vaping Use   Vaping Use: Never used  Substance and Sexual Activity   Alcohol use: Not Currently    Alcohol/week: 2.0 standard drinks    Types: 2 Cans of beer per week   Drug use: No   Sexual activity: Yes    Birth control/protection: None  Other Topics Concern   Not on file  Social History Narrative   Not on file   Social Determinants of Health   Financial Resource Strain: Not on file  Food Insecurity: No Food Insecurity   Worried About Running Out of Food in the Last Year: Never true   [place] in the Last Year: Never true  Transportation Needs: Unmet Transportation Needs  Lack of Transportation (Medical): Yes   Lack of Transportation (Non-Medical): Yes  Physical Activity: Not on file  Stress: Not on file  Social Connections: Not on file  Intimate Partner Violence: Not on file    Outpatient Medications Prior to Visit  Medication Sig Dispense Refill   acetaminophen (TYLENOL) 500 MG tablet Take 2 tablets (1,000 mg total) by mouth 2 (two) times daily as needed. 180 tablet 3   albuterol (VENTOLIN HFA) 108 (90 Base) MCG/ACT inhaler Inhale 2 puffs into the lungs every 6 (six) hours as needed for wheezing or shortness of breath. 18 g 1   atorvastatin (LIPITOR) 40 MG tablet Take 1 tablet (40 mg total) by mouth daily at 6 PM. 90 tablet 1   blood glucose meter kit and supplies KIT Dispense  based on patient and insurance preference. Use up to four times daily as directed. (FOR ICD-9 250.00, 250.01). 1 each 0   carvedilol (COREG) 6.25 MG tablet Take 1 tablet (6.25 mg total) by mouth 2 (two) times daily with a meal. 60 tablet 3   clopidogrel (PLAVIX) 75 MG tablet Take 1 tablet (75 mg total) by mouth daily. 90 tablet 1   Continuous Blood Gluc Receiver (FREESTYLE LIBRE 14 DAY READER) DEVI 1 each by Does not apply route as needed. 1 each 11   Continuous Blood Gluc Sensor (FREESTYLE LIBRE 14 DAY SENSOR) MISC Use one applicator every 14 (fourteen) days. 6 each 3   cyclobenzaprine (FLEXERIL) 10 MG tablet Take 10 mg by mouth 3 (three) times daily as needed for muscle spasms.     empagliflozin (JARDIANCE) 10 MG TABS tablet Take 1 tablet (10 mg total) by mouth daily before breakfast. 30 tablet 6   ferrous sulfate 325 (65 FE) MG tablet Take 1 tablet (325 mg total) by mouth every other day. 30 tablet 1   furosemide (LASIX) 40 MG tablet Take 1.5 tablets (60 mg total) by mouth 2 (two) times daily. 90 tablet 3   gabapentin (NEURONTIN) 800 MG tablet Take 1 tablet (800 mg total) by mouth every 8 (eight) hours. 270 tablet 1   glipiZIDE (GLUCOTROL) 10 MG tablet Take 1 tablet (10 mg total) by mouth 2 (two) times daily before a meal. 180 tablet 1   insulin glargine (LANTUS) 100 UNIT/ML injection Inject 0.2 mLs (20 Units total) into the skin daily. 20 mL 1   Insulin Lispro Prot & Lispro (HUMALOG MIX 75/25 KWIKPEN) (75-25) 100 UNIT/ML Kwikpen Inject 30 Units into the skin 2 (two) times daily. 54 mL 1   Insulin Pen Needle (TRUEPLUS 5-BEVEL PEN NEEDLES) 31G X 6 MM MISC Inject 1 pen into the skin 2 (two) times daily. 100 each 1   Insulin Syringe-Needle U-100 (TRUEPLUS INSULIN SYRINGE) 31G X 5/16" 0.3 ML MISC USE 3 TIMES DAILY TO INJECT INSULIN 300 each 3   metFORMIN (GLUCOPHAGE) 500 MG tablet Take 1 tablet (500 mg total) by mouth 2 (two) times daily with a meal. 180 tablet 1   Multiple Vitamins-Minerals (CENTRUM  SILVER 50+MEN) TABS Take 1 tablet by mouth daily.     Oxycodone HCl 10 MG TABS Take 10 mg by mouth 3 (three) times daily.     pantoprazole (PROTONIX) 40 MG tablet Take 1 tablet (40 mg total) by mouth daily. 90 tablet 1   potassium chloride SA (KLOR-CON M) 20 MEQ tablet TAKE 2 TABLETS BY MOUTH ONCE DAILY 60 tablet 3   QUEtiapine (SEROQUEL) 300 MG tablet Take 1.5 tablets (450 mg total) by mouth at  bedtime. 135 tablet 1   sacubitril-valsartan (ENTRESTO) 24-26 MG Take 1 tablet by mouth 2 (two) times daily. 180 tablet 3   Semaglutide (OZEMPIC, 0.25 OR 0.5 MG/DOSE, [place]) Inject 0.25 mg into the skin once a week.     spironolactone (ALDACTONE) 25 MG tablet Take 0.5 tablets (12.5 mg total) by mouth at bedtime. 45 tablet 3   tadalafil (CIALIS) 20 MG tablet Take 20 mg by mouth daily as needed for erectile dysfunction.     TRUEPLUS PEN NEEDLES 31G X 6 MM MISC USE AS DIRECTED 100 each 0   amoxicillin (AMOXIL) 500 MG capsule Take 4 capsules (2,000 mg total) by mouth as directed. Take 4 tablets 1 hour prior to dental work, including cleanings. (Patient not taking: Reported on 02/02/2022) 12 capsule 12   No facility-administered medications prior to visit.    No Known Allergies  ROS Review of Systems  HENT:  Positive for ear pain and sore throat.      Objective:    Physical Exam Constitutional:      Appearance: He is obese.  HENT:     Head: Normocephalic and atraumatic.     Nose: Nose normal.     Mouth/Throat:     Mouth: Mucous membranes are moist.  Cardiovascular:     Rate and Rhythm: Normal rate and regular rhythm.     Pulses: Normal pulses.     Heart sounds: Normal heart sounds.  Pulmonary:     Effort: Pulmonary effort is normal.     Breath sounds: Normal breath sounds.  Abdominal:     Comments: hypoactive  Musculoskeletal:        General: Normal range of motion.     Cervical back: Normal range of motion.     Right lower leg: Edema (trace) present.     Left lower leg: Edema (trace)  present.  Skin:    General: Skin is warm and dry.     Capillary Refill: Capillary refill takes less than 2 seconds.  Neurological:     General: No focal deficit present.     Mental Status: He is alert and oriented to person, place, and time.  Psychiatric:        Mood and Affect: Mood normal.        Behavior: Behavior normal.        Thought Content: Thought content normal.        Judgment: Judgment normal.    BP 129/84    Pulse 84    Temp 98.1 F (36.7 C)    Ht '6\' 2"'  (1.88 m)    Wt (!) 316 lb (143.3 kg)    SpO2 92%    BMI 40.57 kg/m  Wt Readings from Last 3 Encounters:  02/02/22 (!) 316 lb (143.3 kg)  01/31/22 (!) 318 lb (144.2 kg)  01/03/22 (!) 317 lb (143.8 kg)     There are no preventive care reminders to display for this patient.   There are no preventive care reminders to display for this patient.  Lab Results  Component Value Date   TSH 2.340 09/30/2021   Lab Results  Component Value Date   WBC 6.2 11/24/2021   HGB 13.8 11/24/2021   HCT 43.4 11/24/2021   MCV 81 11/24/2021   PLT 245 11/24/2021   Lab Results  Component Value Date   NA 140 12/26/2021   K 3.9 12/26/2021   CO2 26 12/26/2021   GLUCOSE 96 12/26/2021   BUN 16 12/26/2021   CREATININE 1.06  12/26/2021   BILITOT 0.3 11/04/2021   ALKPHOS 118 11/04/2021   AST 18 11/04/2021   ALT 17 06/30/2021   PROT 7.8 11/04/2021   ALBUMIN 4.5 11/04/2021   CALCIUM 9.1 12/26/2021   ANIONGAP 10 12/26/2021   EGFR 74 11/24/2021   Lab Results  Component Value Date   CHOL 139 11/04/2021   Lab Results  Component Value Date   HDL 31 (L) 11/04/2021   Lab Results  Component Value Date   LDLCALC 64 11/04/2021   Lab Results  Component Value Date   TRIG 275 (H) 11/04/2021   Lab Results  Component Value Date   CHOLHDL 4.5 11/04/2021   Lab Results  Component Value Date   HGBA1C 8.4 (A) 09/30/2021   HGBA1C 8.4 09/30/2021   HGBA1C 8.4 (A) 09/30/2021   HGBA1C 8.4 (A) 09/30/2021      Assessment & Plan:    Problem List Items Addressed This Visit       Cardiovascular and Mediastinum   Chronic diastolic CHF (congestive heart failure) (HCC)     Respiratory   OSA on CPAP   Relevant Orders   Split night study     Endocrine   Type 2 diabetes mellitus with hyperglycemia, with long-term current use of insulin (HCC) - Primary Persistent Encourage compliance with current treatment regimen Encourage regular CBG monitoring Encourage contacting office if excessive hyperglycemia and or hypoglycemia Lifestyle modification with healthy diet (fewer calories, more high fiber foods, whole grains and non-starchy vegetables, lower fat meat and fish, low-fat diary include healthy oils) regular exercise (physical activity) and weight loss Opthalmology exam discussed  Nutritional consult recommended Regular dental visits encouraged Home BP monitoring also encouraged goal <130/80    Relevant Orders   HgB A1c   POCT URINALYSIS DIP (CLINITEK)   Microalbumin/Creatinine Ratio, Urine   Other Visit Diagnoses     Constipation, unspecified constipation type     Education provided Lubiprostone 24 mcg twice daily with meals   Seasonal allergic rhinitis due to pollen   Encouraged use of over-the-counter antihistamine Started Pataday 0.1% twice daily       Meds ordered this encounter  Medications   olopatadine (PATADAY) 0.1 % ophthalmic solution    Sig: Place 1 drop into both eyes 2 (two) times daily.    Dispense:  5 mL    Refill:  12    Order Specific Question:   Supervising Provider    Answer:   Tresa Garter [[id number]]   lubiprostone (AMITIZA) 24 MCG capsule    Sig: Take 1 capsule (24 mcg total) by mouth 2 (two) times daily with a meal. Swallow the medication whole. Do not break or chew the medication.    Dispense:  60 capsule    Refill:  0    Do not place this medication, or any other prescription from our practice, on "Automatic Refill". Patient may have prescription filled one day early  if pharmacy is closed on scheduled refill date.    Order Specific Question:   Supervising Provider    Answer:   Tresa Garter [[id number]]    Follow-up: Return in about 3 months (around 05/05/2022) for follow up [id number].    [name], NP

## 2022-02-04 LAB — MICROALBUMIN / CREATININE URINE RATIO
Creatinine, Urine: 19.3 mg/dL
Microalb/Creat Ratio: 21 mg/g creat (ref 0–29)
Microalbumin, Urine: 4 ug/mL

## 2022-02-09 NOTE — Telephone Encounter (Signed)
CSW consulted to help with utility bill concerns- reportedly owes over $300 to [place].  CSW able to login as guest to his account and verify that he has no outstanding bill at this time- pt believes that [place] assistance came through and that is how bill was paid. ? ?Pt confirms has no needs at this time since bill was actually up to date. ? ?Will continue to follow and assist as needed ? ?[name], LCSW ?Clinical Social Worker ?[facility] ?Desk#: [phone] ?Cell#: [phone] ? ?

## 2022-02-14 ENCOUNTER — Other Ambulatory Visit (HOSPITAL_COMMUNITY): Payer: Self-pay

## 2022-02-14 NOTE — Progress Notes (Signed)
Paramedicine Encounter    Patient ID: [name], male    DOB: 1961/10/07, 61 y.o.   MRN: [id number]   Arrived for home visit for [name] who reports to be feeling good today reporting no shortness of breath, dizziness, chest pain or trouble taking his medications. He has been compliant with his medications over the last two weeks. He was recently seen by PCP. He reports seasonal allergies but no other complaints.   Vitals and assessment as noted.  Meds reviewed and confirmed pill box X2 completed.  CBG- 149   Refills: Jardiance Atorvastatin Seroquel Freestyle    Patient Care Team: Orion Crook I, NP as PCP - General (Nurse Practitioner) Quintella Reichert, MD as PCP - Cardiology (Cardiology) Laurey Morale, MD as PCP - Advanced Heart Failure (Cardiology) Lanier Prude, MD as PCP - Electrophysiology (Cardiology) Burna Sis, LCSW as Social Worker (Licensed Clinical Social Worker) Tressia Danas, MD as Consulting Physician (Gastroenterology)  Patient Active Problem List   Diagnosis Date Noted   Presence of Watchman left atrial appendage closure device 10/28/2021   Atrial fibrillation (HCC) 10/27/2021   Melena    Chronic diastolic CHF (congestive heart failure) (HCC)    GI bleed 06/29/2021   Severe anemia 06/29/2021   Adenomatous polyp of ascending colon    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   Acute blood loss anemia    Angiodysplasia of stomach    Chronic anticoagulation    CHF exacerbation (HCC) 06/15/2020   AKI (acute kidney injury) (HCC) 06/15/2020   CKD (chronic kidney disease), stage III (HCC) 06/15/2020   Anemia due to chronic blood loss    Gastric AVM    Angiodysplasia of duodenum with hemorrhage    Acute on chronic systolic (congestive) heart failure (HCC) 04/05/2020   Anemia 04/05/2020   PAF (paroxysmal atrial fibrillation) (HCC) 04/05/2020   OSA on CPAP 04/05/2020   Syncope and collapse 04/05/2020   Type 2  diabetes mellitus with hyperglycemia, with long-term current use of insulin (HCC) 12/03/2019   Diabetic polyneuropathy associated with type 2 diabetes mellitus (HCC) 12/03/2019   Erectile dysfunction 12/03/2019   Paranoid schizophrenia, chronic condition (HCC) 01/26/2015   Severe recurrent major depressive disorder with psychotic features (HCC) 01/26/2015   GAD (generalized anxiety disorder) 01/26/2015   OCD (obsessive compulsive disorder) 01/26/2015   Panic disorder without agoraphobia 01/26/2015   Insomnia 01/26/2015    Current Outpatient Medications:    acetaminophen (TYLENOL) 500 MG tablet, Take 2 tablets (1,000 mg total) by mouth 2 (two) times daily as needed., Disp: 180 tablet, Rfl: 3   albuterol (VENTOLIN HFA) 108 (90 Base) MCG/ACT inhaler, Inhale 2 puffs into the lungs every 6 (six) hours as needed for wheezing or shortness of breath., Disp: 18 g, Rfl: 1   amoxicillin (AMOXIL) 500 MG capsule, Take 4 capsules (2,000 mg total) by mouth as directed. Take 4 tablets 1 hour prior to dental work, including cleanings. (Patient not taking: Reported on 02/02/2022), Disp: 12 capsule, Rfl: 12   atorvastatin (LIPITOR) 40 MG tablet, Take 1 tablet (40 mg total) by mouth daily at 6 PM., Disp: 90 tablet, Rfl: 1   blood glucose meter kit and supplies KIT, Dispense based on patient and insurance preference. Use up to four times daily as directed. (FOR ICD-9 250.00, 250.01)., Disp: 1 each, Rfl: 0   carvedilol (COREG) 6.25 MG tablet, Take 1 tablet (6.25 mg total) by mouth 2 (two) times daily with a meal., Disp: 60 tablet, Rfl: 3  clopidogrel (PLAVIX) 75 MG tablet, Take 1 tablet (75 mg total) by mouth daily., Disp: 90 tablet, Rfl: 1   Continuous Blood Gluc Receiver (FREESTYLE LIBRE 14 DAY READER) DEVI, 1 each by Does not apply route as needed., Disp: 1 each, Rfl: 11   Continuous Blood Gluc Sensor (FREESTYLE LIBRE 14 DAY SENSOR) MISC, Use one applicator every 14 (fourteen) days., Disp: 6 each, Rfl: 3    cyclobenzaprine (FLEXERIL) 10 MG tablet, Take 10 mg by mouth 3 (three) times daily as needed for muscle spasms., Disp: , Rfl:    empagliflozin (JARDIANCE) 10 MG TABS tablet, Take 1 tablet (10 mg total) by mouth daily before breakfast., Disp: 30 tablet, Rfl: 6   ferrous sulfate 325 (65 FE) MG tablet, Take 1 tablet (325 mg total) by mouth every other day., Disp: 30 tablet, Rfl: 1   furosemide (LASIX) 40 MG tablet, Take 1.5 tablets (60 mg total) by mouth 2 (two) times daily., Disp: 90 tablet, Rfl: 3   gabapentin (NEURONTIN) 800 MG tablet, Take 1 tablet (800 mg total) by mouth every 8 (eight) hours., Disp: 270 tablet, Rfl: 1   glipiZIDE (GLUCOTROL) 10 MG tablet, Take 1 tablet (10 mg total) by mouth 2 (two) times daily before a meal., Disp: 180 tablet, Rfl: 1   insulin glargine (LANTUS) 100 UNIT/ML injection, Inject 0.2 mLs (20 Units total) into the skin daily., Disp: 20 mL, Rfl: 1   Insulin Lispro Prot & Lispro (HUMALOG MIX 75/25 KWIKPEN) (75-25) 100 UNIT/ML Kwikpen, Inject 30 Units into the skin 2 (two) times daily., Disp: 54 mL, Rfl: 1   Insulin Pen Needle (TRUEPLUS 5-BEVEL PEN NEEDLES) 31G X 6 MM MISC, Inject 1 pen into the skin 2 (two) times daily., Disp: 100 each, Rfl: 1   Insulin Syringe-Needle U-100 (TRUEPLUS INSULIN SYRINGE) 31G X 5/16" 0.3 ML MISC, USE 3 TIMES DAILY TO INJECT INSULIN, Disp: 300 each, Rfl: 3   lubiprostone (AMITIZA) 24 MCG capsule, Take 1 capsule (24 mcg total) by mouth 2 (two) times daily with a meal. Swallow the medication whole. Do not break or chew the medication., Disp: 60 capsule, Rfl: 0   metFORMIN (GLUCOPHAGE) 500 MG tablet, Take 1 tablet (500 mg total) by mouth 2 (two) times daily with a meal., Disp: 180 tablet, Rfl: 1   Multiple Vitamins-Minerals (CENTRUM SILVER 50+MEN) TABS, Take 1 tablet by mouth daily., Disp: , Rfl:    olopatadine (PATADAY) 0.1 % ophthalmic solution, Place 1 drop into both eyes 2 (two) times daily., Disp: 5 mL, Rfl: 12   Oxycodone HCl 10 MG TABS, Take  10 mg by mouth 3 (three) times daily., Disp: , Rfl:    pantoprazole (PROTONIX) 40 MG tablet, Take 1 tablet (40 mg total) by mouth daily., Disp: 90 tablet, Rfl: 1   potassium chloride SA (KLOR-CON M) 20 MEQ tablet, TAKE 2 TABLETS BY MOUTH ONCE DAILY, Disp: 60 tablet, Rfl: 3   QUEtiapine (SEROQUEL) 300 MG tablet, Take 1.5 tablets (450 mg total) by mouth at bedtime., Disp: 135 tablet, Rfl: 1   sacubitril-valsartan (ENTRESTO) 24-26 MG, Take 1 tablet by mouth 2 (two) times daily., Disp: 180 tablet, Rfl: 3   Semaglutide (OZEMPIC, 0.25 OR 0.5 MG/DOSE, [place]), Inject 0.25 mg into the skin once a week., Disp: , Rfl:    spironolactone (ALDACTONE) 25 MG tablet, Take 0.5 tablets (12.5 mg total) by mouth at bedtime., Disp: 45 tablet, Rfl: 3   tadalafil (CIALIS) 20 MG tablet, Take 20 mg by mouth daily as needed for erectile  dysfunction., Disp: , Rfl:    TRUEPLUS PEN NEEDLES 31G X 6 MM MISC, USE AS DIRECTED, Disp: 100 each, Rfl: 0 No Known Allergies   Social History   Socioeconomic History   Marital status: Legally Separated    Spouse name: Not on file   Number of children: Not on file   Years of education: Not on file   Highest education level: Not on file  Occupational History   Not on file  Tobacco Use   Smoking status: Former   Smokeless tobacco: Never  Vaping Use   Vaping Use: Never used  Substance and Sexual Activity   Alcohol use: Not Currently    Alcohol/week: 2.0 standard drinks    Types: 2 Cans of beer per week   Drug use: No   Sexual activity: Yes    Birth control/protection: None  Other Topics Concern   Not on file  Social History Narrative   Not on file   Social Determinants of Health   Financial Resource Strain: Not on file  Food Insecurity: No Food Insecurity   Worried About Programme researcher, broadcasting/film/video in the Last Year: Never true   Ran Out of Food in the Last Year: Never true  Transportation Needs: Unmet Transportation Needs   Lack of Transportation (Medical): Yes   Lack of  Transportation (Non-Medical): Yes  Physical Activity: Not on file  Stress: Not on file  Social Connections: Not on file  Intimate Partner Violence: Not on file    Physical Exam Vitals reviewed.  Constitutional:      Appearance: Normal appearance. He is normal weight.  HENT:     Head: Normocephalic.     Nose: Nose normal.     Mouth/Throat:     Mouth: Mucous membranes are moist.     Pharynx: Oropharynx is clear.  Eyes:     Conjunctiva/sclera: Conjunctivae normal.     Pupils: Pupils are equal, round, and reactive to light.  Cardiovascular:     Rate and Rhythm: Normal rate and regular rhythm.     Pulses: Normal pulses.     Heart sounds: Normal heart sounds.  Pulmonary:     Effort: Pulmonary effort is normal.     Breath sounds: Normal breath sounds.  Abdominal:     General: There is distension.     Palpations: Abdomen is soft.  Musculoskeletal:        General: No swelling. Normal range of motion.     Cervical back: Normal range of motion.     Right lower leg: No edema.     Left lower leg: No edema.  Skin:    General: Skin is warm and dry.     Capillary Refill: Capillary refill takes less than 2 seconds.  Neurological:     General: No focal deficit present.     Mental Status: He is alert. Mental status is at baseline.  Psychiatric:        Mood and Affect: Mood normal.        Future Appointments  Date Time Provider Department Center  03/03/2022  7:40 AM [facility] DEVICE REMOTES [facility]  03/31/2022  3:00 PM Laurey Morale, MD [facility] None  04/26/2022  1:30 PM [facility] STRUCTURAL HEART APP [facility]  05/05/2022 10:40 AM Orion Crook I, NP [facility] None  06/02/2022  7:40 AM [facility] DEVICE REMOTES [facility]  09/01/2022  7:40 AM [facility] DEVICE REMOTES [facility]  12/01/2022  7:40 AM [facility] DEVICE REMOTES [facility]  03/02/2023  7:40 AM [facility] DEVICE REMOTES [facility]   06/01/2023  7:40 AM [facility] DEVICE REMOTES [facility]     ACTION: Home visit completed

## 2022-02-28 ENCOUNTER — Other Ambulatory Visit (HOSPITAL_COMMUNITY): Payer: Self-pay

## 2022-02-28 NOTE — Progress Notes (Signed)
Paramedicine Encounter ? ? ? Patient ID: [name], male    DOB: 1961-08-04, 61 y.o.   MRN: [id number] ? ? ? ?Arrived for home visit for [facility] where he reports feeling good with no complaints. Over the last two weeks he obtained upper dentures which he says feel good with no complications! Vitals were obtained and are as noted in report. No lower leg swelling, chest pain, shortness of breath or dizziness. He applied his freestyle on his own with no problems, CBG- 98. He has been compliant with medications over the last week only missing two bedtime doses.  ? ?Assessment and vitals as noted.  ? ?Meds reviewed and confirmed filling pill box for one week.  ? ?Refills: ?Lantus ?Carvedilol ?Plavix ? ?We reviewed appointments, he confirmed he will be at sleep study on the 12th. He has no social concerns at this time. Home visit complete.  ? ? ? ? ? ?Patient Care Team: ?Teena Dunk, NP as PCP - General (Nurse Practitioner) ?[name], MD as PCP - Cardiology (Cardiology) ?[name], MD as PCP - Advanced Heart Failure (Cardiology) ?[name], MD as PCP - Electrophysiology (Cardiology) ?[name], LCSW as Education officer, museum (Licensed Holiday representative) ?[name], MD as Consulting Physician (Gastroenterology) ? ?Patient Active Problem List  ? Diagnosis Date Noted  ? Presence of Watchman left atrial appendage closure device 10/28/2021  ? Atrial fibrillation ([place]) 10/27/2021  ? Melena   ? Chronic diastolic CHF (congestive heart failure) ([place])   ? GI bleed 06/29/2021  ? Severe anemia 06/29/2021  ? Adenomatous polyp of ascending colon   ? 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  ? Acute blood loss anemia   ? Angiodysplasia of stomach   ? Chronic anticoagulation   ? CHF exacerbation ([place]) 06/15/2020  ? AKI (acute kidney injury) ([place]) 06/15/2020  ? CKD (chronic kidney disease), stage III ([place]) 06/15/2020  ? Anemia due to chronic blood loss   ? Gastric AVM    ? Angiodysplasia of duodenum with hemorrhage   ? Acute on chronic systolic (congestive) heart failure ([place]) 04/05/2020  ? Anemia 04/05/2020  ? PAF (paroxysmal atrial fibrillation) ([place]) 04/05/2020  ? OSA on CPAP 04/05/2020  ? Syncope and collapse 04/05/2020  ? Type 2 diabetes mellitus with hyperglycemia, with long-term current use of insulin ([place]) 12/03/2019  ? Diabetic polyneuropathy associated with type 2 diabetes mellitus ([place]) 12/03/2019  ? Erectile dysfunction 12/03/2019  ? Paranoid schizophrenia, chronic condition ([place]) 01/26/2015  ? Severe recurrent major depressive disorder with psychotic features ([place]) 01/26/2015  ? GAD (generalized anxiety disorder) 01/26/2015  ? OCD (obsessive compulsive disorder) 01/26/2015  ? Panic disorder without agoraphobia 01/26/2015  ? Insomnia 01/26/2015  ? ? ?Current Outpatient Medications:  ?  acetaminophen (TYLENOL) 500 MG tablet, Take 2 tablets (1,000 mg total) by mouth 2 (two) times daily as needed., Disp: 180 tablet, Rfl: 3 ?  albuterol (VENTOLIN HFA) 108 (90 Base) MCG/ACT inhaler, Inhale 2 puffs into the lungs every 6 (six) hours as needed for wheezing or shortness of breath., Disp: 18 g, Rfl: 1 ?  amoxicillin (AMOXIL) 500 MG capsule, Take 4 capsules (2,000 mg total) by mouth as directed. Take 4 tablets 1 hour prior to dental work, including cleanings. (Patient not taking: Reported on 02/02/2022), Disp: 12 capsule, Rfl: 12 ?  atorvastatin (LIPITOR) 40 MG tablet, Take 1 tablet (40 mg total) by mouth daily at 6 PM., Disp: 90 tablet, Rfl: 1 ?  blood glucose meter kit  and supplies KIT, Dispense based on patient and insurance preference. Use up to four times daily as directed. (FOR ICD-9 250.00, 250.01)., Disp: 1 each, Rfl: 0 ?  carvedilol (COREG) 6.25 MG tablet, Take 1 tablet (6.25 mg total) by mouth 2 (two) times daily with a meal., Disp: 60 tablet, Rfl: 3 ?  clopidogrel (PLAVIX) 75 MG tablet, Take 1 tablet (75 mg total) by mouth daily., Disp: 90 tablet, Rfl: 1 ?  Continuous  Blood Gluc Receiver (FREESTYLE LIBRE 14 DAY READER) DEVI, 1 each by Does not apply route as needed., Disp: 1 each, Rfl: 11 ?  Continuous Blood Gluc Sensor (FREESTYLE LIBRE 14 DAY SENSOR) MISC, Use one applicator every 14 (fourteen) days., Disp: 6 each, Rfl: 3 ?  cyclobenzaprine (FLEXERIL) 10 MG tablet, Take 10 mg by mouth 3 (three) times daily as needed for muscle spasms., Disp: , Rfl:  ?  empagliflozin (JARDIANCE) 10 MG TABS tablet, Take 1 tablet (10 mg total) by mouth daily before breakfast., Disp: 30 tablet, Rfl: 6 ?  ferrous sulfate 325 (65 FE) MG tablet, Take 1 tablet (325 mg total) by mouth every other day., Disp: 30 tablet, Rfl: 1 ?  furosemide (LASIX) 40 MG tablet, Take 1.5 tablets (60 mg total) by mouth 2 (two) times daily., Disp: 90 tablet, Rfl: 3 ?  gabapentin (NEURONTIN) 800 MG tablet, Take 1 tablet (800 mg total) by mouth every 8 (eight) hours., Disp: 270 tablet, Rfl: 1 ?  glipiZIDE (GLUCOTROL) 10 MG tablet, Take 1 tablet (10 mg total) by mouth 2 (two) times daily before a meal., Disp: 180 tablet, Rfl: 1 ?  insulin glargine (LANTUS) 100 UNIT/ML injection, Inject 0.2 mLs (20 Units total) into the skin daily., Disp: 20 mL, Rfl: 1 ?  Insulin Lispro Prot & Lispro (HUMALOG MIX 75/25 KWIKPEN) (75-25) 100 UNIT/ML Kwikpen, Inject 30 Units into the skin 2 (two) times daily., Disp: 54 mL, Rfl: 1 ?  Insulin Pen Needle (TRUEPLUS 5-BEVEL PEN NEEDLES) 31G X 6 MM MISC, Inject 1 pen into the skin 2 (two) times daily., Disp: 100 each, Rfl: 1 ?  Insulin Syringe-Needle U-100 (TRUEPLUS INSULIN SYRINGE) 31G X 5/16" 0.3 ML MISC, USE 3 TIMES DAILY TO INJECT INSULIN, Disp: 300 each, Rfl: 3 ?  lubiprostone (AMITIZA) 24 MCG capsule, Take 1 capsule (24 mcg total) by mouth 2 (two) times daily with a meal. Swallow the medication whole. Do not break or chew the medication., Disp: 60 capsule, Rfl: 0 ?  metFORMIN (GLUCOPHAGE) 500 MG tablet, Take 1 tablet (500 mg total) by mouth 2 (two) times daily with a meal., Disp: 180 tablet, Rfl:  1 ?  Multiple Vitamins-Minerals (CENTRUM SILVER 50+MEN) TABS, Take 1 tablet by mouth daily., Disp: , Rfl:  ?  olopatadine (PATADAY) 0.1 % ophthalmic solution, Place 1 drop into both eyes 2 (two) times daily., Disp: 5 mL, Rfl: 12 ?  Oxycodone HCl 10 MG TABS, Take 10 mg by mouth 3 (three) times daily., Disp: , Rfl:  ?  pantoprazole (PROTONIX) 40 MG tablet, Take 1 tablet (40 mg total) by mouth daily., Disp: 90 tablet, Rfl: 1 ?  potassium chloride SA (KLOR-CON M) 20 MEQ tablet, TAKE 2 TABLETS BY MOUTH ONCE DAILY, Disp: 60 tablet, Rfl: 3 ?  QUEtiapine (SEROQUEL) 300 MG tablet, Take 1.5 tablets (450 mg total) by mouth at bedtime., Disp: 135 tablet, Rfl: 1 ?  sacubitril-valsartan (ENTRESTO) 24-26 MG, Take 1 tablet by mouth 2 (two) times daily., Disp: 180 tablet, Rfl: 3 ?  Semaglutide (OZEMPIC, 0.25  OR 0.5 MG/DOSE, [place]), Inject 0.25 mg into the skin once a week., Disp: , Rfl:  ?  spironolactone (ALDACTONE) 25 MG tablet, Take 0.5 tablets (12.5 mg total) by mouth at bedtime., Disp: 45 tablet, Rfl: 3 ?  tadalafil (CIALIS) 20 MG tablet, Take 20 mg by mouth daily as needed for erectile dysfunction., Disp: , Rfl:  ?  TRUEPLUS PEN NEEDLES 31G X 6 MM MISC, USE AS DIRECTED, Disp: 100 each, Rfl: 0 ?No Known Allergies ? ? ?Social History  ? ?Socioeconomic History  ? Marital status: Legally Separated  ?  Spouse name: Not on file  ? Number of children: Not on file  ? Years of education: Not on file  ? Highest education level: Not on file  ?Occupational History  ? Not on file  ?Tobacco Use  ? Smoking status: Former  ? Smokeless tobacco: Never  ?Vaping Use  ? Vaping Use: Never used  ?Substance and Sexual Activity  ? Alcohol use: Not Currently  ?  Alcohol/week: 2.0 standard drinks  ?  Types: 2 Cans of beer per week  ? Drug use: No  ? Sexual activity: Yes  ?  Birth control/protection: None  ?Other Topics Concern  ? Not on file  ?Social History Narrative  ? Not on file  ? ?Social Determinants of Health  ? ?Financial Resource Strain: Not on  file  ?Food Insecurity: No Food Insecurity  ? Worried About Charity fundraiser in the Last Year: Never true  ? Ran Out of Food in the Last Year: Never true  ?Transportation Needs: Producer, television/film/video

## 2022-03-02 LAB — CUP PACEART REMOTE DEVICE CHECK
Battery Voltage: 3.08 V
Brady Statistic RV Percent Paced: 0 %
Date Time Interrogation Session: 20230405093222
HighPow Impedance: 78 Ohm
Implantable Lead Implant Date: 20211008
Implantable Lead Location: 753860
Implantable Lead Model: 436910
Implantable Lead Serial Number: 81404997
Implantable Pulse Generator Implant Date: 20211008
Lead Channel Impedance Value: 461 Ohm
Lead Channel Pacing Threshold Amplitude: 0.5 V
Lead Channel Pacing Threshold Pulse Width: 0.4 ms
Lead Channel Sensing Intrinsic Amplitude: 1.4 mV
Lead Channel Sensing Intrinsic Amplitude: 18.1 mV
Lead Channel Setting Pacing Amplitude: 2 V
Lead Channel Setting Pacing Pulse Width: 0.4 ms
Lead Channel Setting Sensing Sensitivity: 0.8 mV
Pulse Gen Model: 429525
Pulse Gen Serial Number: 84810752

## 2022-03-03 ENCOUNTER — Ambulatory Visit (INDEPENDENT_AMBULATORY_CARE_PROVIDER_SITE_OTHER): Payer: Medicare Other

## 2022-03-03 DIAGNOSIS — I5032 Chronic diastolic (congestive) heart failure: Secondary | ICD-10-CM

## 2022-03-07 ENCOUNTER — Other Ambulatory Visit (HOSPITAL_COMMUNITY): Payer: Self-pay

## 2022-03-07 NOTE — Progress Notes (Signed)
Paramedicine Encounter ? ? ? Patient ID: [name], male    DOB: 14-Aug-1961, 61 y.o.   MRN: [id number] ? ? ?Met [name] in the home today for a medication review and pill box fill. He has a family emergency so he has to leave town today at 70 so only medication review was completed today.  ? ?I reviewed all meds and filled pill box for one week.  ? ?Refills: ?Lasix ?Potassium  ? ?CBG- 117 ? ?I will follow up for full visit in one week.  ? ? ? ?ACTION: ?Home visit completed ? ? ? ? ? ? ?

## 2022-03-08 ENCOUNTER — Encounter (HOSPITAL_BASED_OUTPATIENT_CLINIC_OR_DEPARTMENT_OTHER): Payer: Medicare Other | Admitting: Internal Medicine

## 2022-03-15 ENCOUNTER — Other Ambulatory Visit (HOSPITAL_COMMUNITY): Payer: Self-pay

## 2022-03-15 NOTE — Progress Notes (Signed)
Paramedicine Encounter ? ? ? Patient ID: [name], male    DOB: 16-Aug-1961, 61 y.o.   MRN: [id number] ? ? ?Arrived for home visit for [name] who reports feeling good. He denied any chest pain, shortness of breath or swelling. Lungs sound clear.  ? ? ?Assessment and vitals as noted.  ?Meds reviewed and confirmed. Pill box filled accordingly.  ? ?[id number]-[id number] ? ?We reviewed appointments and confirmed same. Home visit complete. I will see him on Tuesday next week.  ? ? ?Refills: ?Gabapentin  ? ? ?Patient Care Team: ?Teena Dunk, NP as PCP - General (Nurse Practitioner) ?[name], MD as PCP - Cardiology (Cardiology) ?[name], MD as PCP - Advanced Heart Failure (Cardiology) ?[name], MD as PCP - Electrophysiology (Cardiology) ?[name], LCSW as Education officer, museum (Licensed Holiday representative) ?[name], MD as Consulting Physician (Gastroenterology) ? ?Patient Active Problem List  ? Diagnosis Date Noted  ? Presence of Watchman left atrial appendage closure device 10/28/2021  ? Atrial fibrillation ([place]) 10/27/2021  ? Melena   ? Chronic diastolic CHF (congestive heart failure) ([place])   ? GI bleed 06/29/2021  ? Severe anemia 06/29/2021  ? Adenomatous polyp of ascending colon   ? 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  ? Acute blood loss anemia   ? Angiodysplasia of stomach   ? Chronic anticoagulation   ? CHF exacerbation ([place]) 06/15/2020  ? AKI (acute kidney injury) ([place]) 06/15/2020  ? CKD (chronic kidney disease), stage III ([place]) 06/15/2020  ? Anemia due to chronic blood loss   ? Gastric AVM   ? Angiodysplasia of duodenum with hemorrhage   ? Acute on chronic systolic (congestive) heart failure ([place]) 04/05/2020  ? Anemia 04/05/2020  ? PAF (paroxysmal atrial fibrillation) ([place]) 04/05/2020  ? OSA on CPAP 04/05/2020  ? Syncope and collapse 04/05/2020  ? Type 2 diabetes mellitus with hyperglycemia, with long-term current use of insulin ([place]) 12/03/2019   ? Diabetic polyneuropathy associated with type 2 diabetes mellitus ([place]) 12/03/2019  ? Erectile dysfunction 12/03/2019  ? Paranoid schizophrenia, chronic condition ([place]) 01/26/2015  ? Severe recurrent major depressive disorder with psychotic features ([place]) 01/26/2015  ? GAD (generalized anxiety disorder) 01/26/2015  ? OCD (obsessive compulsive disorder) 01/26/2015  ? Panic disorder without agoraphobia 01/26/2015  ? Insomnia 01/26/2015  ? ? ?Current Outpatient Medications:  ?  acetaminophen (TYLENOL) 500 MG tablet, Take 2 tablets (1,000 mg total) by mouth 2 (two) times daily as needed., Disp: 180 tablet, Rfl: 3 ?  albuterol (VENTOLIN HFA) 108 (90 Base) MCG/ACT inhaler, Inhale 2 puffs into the lungs every 6 (six) hours as needed for wheezing or shortness of breath., Disp: 18 g, Rfl: 1 ?  amoxicillin (AMOXIL) 500 MG capsule, Take 4 capsules (2,000 mg total) by mouth as directed. Take 4 tablets 1 hour prior to dental work, including cleanings. (Patient not taking: Reported on 02/02/2022), Disp: 12 capsule, Rfl: 12 ?  atorvastatin (LIPITOR) 40 MG tablet, Take 1 tablet (40 mg total) by mouth daily at 6 PM., Disp: 90 tablet, Rfl: 1 ?  blood glucose meter kit and supplies KIT, Dispense based on patient and insurance preference. Use up to four times daily as directed. (FOR ICD-9 250.00, 250.01)., Disp: 1 each, Rfl: 0 ?  carvedilol (COREG) 6.25 MG tablet, Take 1 tablet (6.25 mg total) by mouth 2 (two) times daily with a meal., Disp: 60 tablet, Rfl: 3 ?  clopidogrel (PLAVIX) 75 MG tablet, Take 1 tablet (75 mg total)  by mouth daily., Disp: 90 tablet, Rfl: 1 ?  Continuous Blood Gluc Receiver (FREESTYLE LIBRE 14 DAY READER) DEVI, 1 each by Does not apply route as needed., Disp: 1 each, Rfl: 11 ?  Continuous Blood Gluc Sensor (FREESTYLE LIBRE 14 DAY SENSOR) MISC, Use one applicator every 14 (fourteen) days., Disp: 6 each, Rfl: 3 ?  cyclobenzaprine (FLEXERIL) 10 MG tablet, Take 10 mg by mouth 3 (three) times daily as needed for  muscle spasms., Disp: , Rfl:  ?  empagliflozin (JARDIANCE) 10 MG TABS tablet, Take 1 tablet (10 mg total) by mouth daily before breakfast., Disp: 30 tablet, Rfl: 6 ?  ferrous sulfate 325 (65 FE) MG tablet, Take 1 tablet (325 mg total) by mouth every other day., Disp: 30 tablet, Rfl: 1 ?  furosemide (LASIX) 40 MG tablet, Take 1.5 tablets (60 mg total) by mouth 2 (two) times daily., Disp: 90 tablet, Rfl: 3 ?  gabapentin (NEURONTIN) 800 MG tablet, Take 1 tablet (800 mg total) by mouth every 8 (eight) hours., Disp: 270 tablet, Rfl: 1 ?  glipiZIDE (GLUCOTROL) 10 MG tablet, Take 1 tablet (10 mg total) by mouth 2 (two) times daily before a meal., Disp: 180 tablet, Rfl: 1 ?  insulin glargine (LANTUS) 100 UNIT/ML injection, Inject 0.2 mLs (20 Units total) into the skin daily., Disp: 20 mL, Rfl: 1 ?  Insulin Lispro Prot & Lispro (HUMALOG MIX 75/25 KWIKPEN) (75-25) 100 UNIT/ML Kwikpen, Inject 30 Units into the skin 2 (two) times daily., Disp: 54 mL, Rfl: 1 ?  Insulin Pen Needle (TRUEPLUS 5-BEVEL PEN NEEDLES) 31G X 6 MM MISC, Inject 1 pen into the skin 2 (two) times daily., Disp: 100 each, Rfl: 1 ?  Insulin Syringe-Needle U-100 (TRUEPLUS INSULIN SYRINGE) 31G X 5/16" 0.3 ML MISC, USE 3 TIMES DAILY TO INJECT INSULIN, Disp: 300 each, Rfl: 3 ?  metFORMIN (GLUCOPHAGE) 500 MG tablet, Take 1 tablet (500 mg total) by mouth 2 (two) times daily with a meal., Disp: 180 tablet, Rfl: 1 ?  Multiple Vitamins-Minerals (CENTRUM SILVER 50+MEN) TABS, Take 1 tablet by mouth daily., Disp: , Rfl:  ?  olopatadine (PATADAY) 0.1 % ophthalmic solution, Place 1 drop into both eyes 2 (two) times daily., Disp: 5 mL, Rfl: 12 ?  Oxycodone HCl 10 MG TABS, Take 10 mg by mouth 3 (three) times daily., Disp: , Rfl:  ?  pantoprazole (PROTONIX) 40 MG tablet, Take 1 tablet (40 mg total) by mouth daily., Disp: 90 tablet, Rfl: 1 ?  potassium chloride SA (KLOR-CON M) 20 MEQ tablet, TAKE 2 TABLETS BY MOUTH ONCE DAILY, Disp: 60 tablet, Rfl: 3 ?  QUEtiapine (SEROQUEL) 300  MG tablet, Take 1.5 tablets (450 mg total) by mouth at bedtime., Disp: 135 tablet, Rfl: 1 ?  sacubitril-valsartan (ENTRESTO) 24-26 MG, Take 1 tablet by mouth 2 (two) times daily., Disp: 180 tablet, Rfl: 3 ?  Semaglutide (OZEMPIC, 0.25 OR 0.5 MG/DOSE, [place]), Inject 0.25 mg into the skin once a week., Disp: , Rfl:  ?  spironolactone (ALDACTONE) 25 MG tablet, Take 0.5 tablets (12.5 mg total) by mouth at bedtime., Disp: 45 tablet, Rfl: 3 ?  tadalafil (CIALIS) 20 MG tablet, Take 20 mg by mouth daily as needed for erectile dysfunction., Disp: , Rfl:  ?  TRUEPLUS PEN NEEDLES 31G X 6 MM MISC, USE AS DIRECTED, Disp: 100 each, Rfl: 0 ?No Known Allergies ? ? ?Social History  ? ?Socioeconomic History  ? Marital status: Legally Separated  ?  Spouse name: Not on file  ?  Number of children: Not on file  ? Years of education: Not on file  ? Highest education level: Not on file  ?Occupational History  ? Not on file  ?Tobacco Use  ? Smoking status: Former  ? Smokeless tobacco: Never  ?Vaping Use  ? Vaping Use: Never used  ?Substance and Sexual Activity  ? Alcohol use: Not Currently  ?  Alcohol/week: 2.0 standard drinks  ?  Types: 2 Cans of beer per week  ? Drug use: No  ? Sexual activity: Yes  ?  Birth control/protection: None  ?Other Topics Concern  ? Not on file  ?Social History Narrative  ? Not on file  ? ?Social Determinants of Health  ? ?Financial Resource Strain: Not on file  ?Food Insecurity: No Food Insecurity  ? Worried About Charity fundraiser in the Last Year: Never true  ? Ran Out of Food in the Last Year: Never true  ?Transportation Needs: Unmet Transportation Needs  ? Lack of Transportation (Medical): Yes  ? Lack of Transportation (Non-Medical): Yes  ?Physical Activity: Not on file  ?Stress: Not on file  ?Social Connections: Not on file  ?Intimate Partner Violence: Not on file  ? ? ?Physical Exam ?Vitals reviewed.  ?Constitutional:   ?   Appearance: Normal appearance. He is normal weight.  ?HENT:  ?   Head:  Normocephalic.  ?   Nose: Nose normal.  ?   Mouth/Throat:  ?   Mouth: Mucous membranes are moist.  ?   Pharynx: Oropharynx is clear.  ?Eyes:  ?   Conjunctiva/sclera: Conjunctivae normal.  ?   Pupils: Pupils are equal,

## 2022-03-16 ENCOUNTER — Telehealth: Payer: Self-pay | Admitting: Pharmacist

## 2022-03-16 NOTE — Telephone Encounter (Addendum)
Reached out to [facility] who has been administering pt's weekly Ozempic. She reports he's been tolerating med well, currently on 0.'5mg'$  weekly. Hasn't seen much weight loss as he continues to eat large portions and fast food, have discussed need for changing his diet. Has not had any low glucose readings. No side effects of note. He will increase to '1mg'$  weekly dose once he's finished using his 0.'5mg'$  pen. Will send in updated rx to pt assistance program for '2mg'$  maintenance dose as he'll need this for his next shipment (comes every 4 months so already has 2 months worth of '1mg'$  dose). ?

## 2022-03-20 ENCOUNTER — Other Ambulatory Visit (HOSPITAL_COMMUNITY): Payer: Self-pay

## 2022-03-20 NOTE — Progress Notes (Signed)
Remote ICD transmission.   

## 2022-03-20 NOTE — Progress Notes (Signed)
Paramedicine Encounter ? ? ? Patient ID: [name], male    DOB: 09-Apr-1961, 62 y.o.   MRN: [id number] ? ? ?Arrived for home visit for [name] who reports feeling well with no complaints. He was seen last week for full home visit, today we are completing a med rec.  ? ? ?Medications reviewed and confirmed. Pill box filled for one week.  ? ?Refills: ?Freestyle sensor ?Gabapentin ? ?We reviewed upcoming appointments. I will return in one week for home visit.  ? ? ? ? ?ACTION: ?Home visit completed ? ? ? ? ? ? ?

## 2022-03-27 ENCOUNTER — Other Ambulatory Visit (HOSPITAL_COMMUNITY): Payer: Self-pay

## 2022-03-27 NOTE — Progress Notes (Signed)
Paramedicine Encounter ? ? ? Patient ID: [name], male    DOB: 1961/01/29, 61 y.o.   MRN: [id number] ? ? ?Arrived for home visit for [name] who reports feeling good with no complaints. He denied shortness of breath, dizziness, chest pain. He has been compliant with medications except his bedtime doses he skipped 5/7 doses last week. He reports he forgets sometimes. We discussed med compliance.  ? ?Vitals and assessment obtained. Weight down 4 lbs. Vitals stable. CBG is 157- he applied his freestyle on his own over the weekend.  ? ?He denied any syncopal episodes, feelings of dizziness or low blood sugars. He is taking Ozempic 68m weekly. Reports it's working well.  ? ?He denied any abnormal bleeding. ? ?I reviewed all meds and confirmed same filling pill box for one week. He goes to see pain management tomorrow.  ? ?We reviewed upcoming appointments and confirmed same.  ? ?Home visit complete,  I plant to see CGerald Stabsin one week.  ? ?Refills: ?Pantoprazole ?Glipizide ?Metformin ?Jardiance ? ? ? ? ? ? ?Patient Care Team: ?PTeena Dunk NP as PCP - General (Nurse Practitioner) ?TSueanne Margarita MD as PCP - Cardiology (Cardiology) ?MLarey Dresser MD as PCP - Advanced Heart Failure (Cardiology) ?LVickie Epley MD as PCP - Electrophysiology (Cardiology) ?UJorge Ny LCSW as SEducation officer, museum(Licensed CHoliday representative ?BThornton Park MD as Consulting Physician (Gastroenterology) ? ?Patient Active Problem List  ? Diagnosis Date Noted  ? Presence of Watchman left atrial appendage closure device 10/28/2021  ? Atrial fibrillation ([place] 10/27/2021  ? Melena   ? Chronic diastolic CHF (congestive heart failure) ([place]   ? GI bleed 06/29/2021  ? Severe anemia 06/29/2021  ? Adenomatous polyp of ascending colon   ? 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  ? Acute blood loss anemia   ? Angiodysplasia of stomach   ? Chronic anticoagulation   ? CHF exacerbation ([place] 06/15/2020   ? AKI (acute kidney injury) ([place] 06/15/2020  ? CKD (chronic kidney disease), stage III ([place] 06/15/2020  ? Anemia due to chronic blood loss   ? Gastric AVM   ? Angiodysplasia of duodenum with hemorrhage   ? Acute on chronic systolic (congestive) heart failure ([place] 04/05/2020  ? Anemia 04/05/2020  ? PAF (paroxysmal atrial fibrillation) ([place] 04/05/2020  ? OSA on CPAP 04/05/2020  ? Syncope and collapse 04/05/2020  ? Type 2 diabetes mellitus with hyperglycemia, with long-term current use of insulin ([place] 12/03/2019  ? Diabetic polyneuropathy associated with type 2 diabetes mellitus ([place] 12/03/2019  ? Erectile dysfunction 12/03/2019  ? Paranoid schizophrenia, chronic condition ([place] 01/26/2015  ? Severe recurrent major depressive disorder with psychotic features ([place] 01/26/2015  ? GAD (generalized anxiety disorder) 01/26/2015  ? OCD (obsessive compulsive disorder) 01/26/2015  ? Panic disorder without agoraphobia 01/26/2015  ? Insomnia 01/26/2015  ? ? ?Current Outpatient Medications:  ?  acetaminophen (TYLENOL) 500 MG tablet, Take 2 tablets (1,000 mg total) by mouth 2 (two) times daily as needed., Disp: 180 tablet, Rfl: 3 ?  albuterol (VENTOLIN HFA) 108 (90 Base) MCG/ACT inhaler, Inhale 2 puffs into the lungs every 6 (six) hours as needed for wheezing or shortness of breath., Disp: 18 g, Rfl: 1 ?  amoxicillin (AMOXIL) 500 MG capsule, Take 4 capsules (2,000 mg total) by mouth as directed. Take 4 tablets 1 hour prior to dental work, including cleanings. (Patient not taking: Reported on 02/02/2022), Disp: 12 capsule, Rfl: 12 ?  atorvastatin (LIPITOR) 40 MG tablet,  Take 1 tablet (40 mg total) by mouth daily at 6 PM., Disp: 90 tablet, Rfl: 1 ?  blood glucose meter kit and supplies KIT, Dispense based on patient and insurance preference. Use up to four times daily as directed. (FOR ICD-9 250.00, 250.01)., Disp: 1 each, Rfl: 0 ?  carvedilol (COREG) 6.25 MG tablet, Take 1 tablet (6.25 mg total) by mouth 2 (two) times daily  with a meal., Disp: 60 tablet, Rfl: 3 ?  clopidogrel (PLAVIX) 75 MG tablet, Take 1 tablet (75 mg total) by mouth daily., Disp: 90 tablet, Rfl: 1 ?  Continuous Blood Gluc Receiver (FREESTYLE LIBRE 14 DAY READER) DEVI, 1 each by Does not apply route as needed., Disp: 1 each, Rfl: 11 ?  Continuous Blood Gluc Sensor (FREESTYLE LIBRE 14 DAY SENSOR) MISC, Use one applicator every 14 (fourteen) days., Disp: 6 each, Rfl: 3 ?  cyclobenzaprine (FLEXERIL) 10 MG tablet, Take 10 mg by mouth 3 (three) times daily as needed for muscle spasms., Disp: , Rfl:  ?  empagliflozin (JARDIANCE) 10 MG TABS tablet, Take 1 tablet (10 mg total) by mouth daily before breakfast., Disp: 30 tablet, Rfl: 6 ?  ferrous sulfate 325 (65 FE) MG tablet, Take 1 tablet (325 mg total) by mouth every other day., Disp: 30 tablet, Rfl: 1 ?  furosemide (LASIX) 40 MG tablet, Take 1.5 tablets (60 mg total) by mouth 2 (two) times daily., Disp: 90 tablet, Rfl: 3 ?  gabapentin (NEURONTIN) 800 MG tablet, Take 1 tablet (800 mg total) by mouth every 8 (eight) hours., Disp: 270 tablet, Rfl: 1 ?  glipiZIDE (GLUCOTROL) 10 MG tablet, Take 1 tablet (10 mg total) by mouth 2 (two) times daily before a meal., Disp: 180 tablet, Rfl: 1 ?  insulin glargine (LANTUS) 100 UNIT/ML injection, Inject 0.2 mLs (20 Units total) into the skin daily., Disp: 20 mL, Rfl: 1 ?  Insulin Lispro Prot & Lispro (HUMALOG MIX 75/25 KWIKPEN) (75-25) 100 UNIT/ML Kwikpen, Inject 30 Units into the skin 2 (two) times daily., Disp: 54 mL, Rfl: 1 ?  Insulin Syringe-Needle U-100 (TRUEPLUS INSULIN SYRINGE) 31G X 5/16" 0.3 ML MISC, USE 3 TIMES DAILY TO INJECT INSULIN, Disp: 300 each, Rfl: 3 ?  metFORMIN (GLUCOPHAGE) 500 MG tablet, Take 1 tablet (500 mg total) by mouth 2 (two) times daily with a meal., Disp: 180 tablet, Rfl: 1 ?  Multiple Vitamins-Minerals (CENTRUM SILVER 50+MEN) TABS, Take 1 tablet by mouth daily., Disp: , Rfl:  ?  olopatadine (PATADAY) 0.1 % ophthalmic solution, Place 1 drop into both eyes 2  (two) times daily., Disp: 5 mL, Rfl: 12 ?  Oxycodone HCl 10 MG TABS, Take 10 mg by mouth 3 (three) times daily., Disp: , Rfl:  ?  pantoprazole (PROTONIX) 40 MG tablet, Take 1 tablet (40 mg total) by mouth daily., Disp: 90 tablet, Rfl: 1 ?  potassium chloride SA (KLOR-CON M) 20 MEQ tablet, TAKE 2 TABLETS BY MOUTH ONCE DAILY, Disp: 60 tablet, Rfl: 3 ?  QUEtiapine (SEROQUEL) 300 MG tablet, Take 1.5 tablets (450 mg total) by mouth at bedtime., Disp: 135 tablet, Rfl: 1 ?  sacubitril-valsartan (ENTRESTO) 24-26 MG, Take 1 tablet by mouth 2 (two) times daily., Disp: 180 tablet, Rfl: 3 ?  Semaglutide, 1 MG/DOSE, (OZEMPIC, 1 MG/DOSE,) 2 MG/1.5ML SOPN, Inject 1 mg into the skin once a week., Disp: , Rfl:  ?  spironolactone (ALDACTONE) 25 MG tablet, Take 0.5 tablets (12.5 mg total) by mouth at bedtime., Disp: 45 tablet, Rfl: 3 ?  tadalafil (  CIALIS) 20 MG tablet, Take 20 mg by mouth daily as needed for erectile dysfunction., Disp: , Rfl:  ?  TRUEPLUS PEN NEEDLES 31G X 6 MM MISC, USE AS DIRECTED, Disp: 100 each, Rfl: 0 ?No Known Allergies ? ? ?Social History  ? ?Socioeconomic History  ? Marital status: Legally Separated  ?  Spouse name: Not on file  ? Number of children: Not on file  ? Years of education: Not on file  ? Highest education level: Not on file  ?Occupational History  ? Not on file  ?Tobacco Use  ? Smoking status: Former  ? Smokeless tobacco: Never  ?Vaping Use  ? Vaping Use: Never used  ?Substance and Sexual Activity  ? Alcohol use: Not Currently  ?  Alcohol/week: 2.0 standard drinks  ?  Types: 2 Cans of beer per week  ? Drug use: No  ? Sexual activity: Yes  ?  Birth control/protection: None  ?Other Topics Concern  ? Not on file  ?Social History Narrative  ? Not on file  ? ?Social Determinants of Health  ? ?Financial Resource Strain: Not on file  ?Food Insecurity: No Food Insecurity  ? Worried About Charity fundraiser in the Last Year: Never true  ? Ran Out of Food in the Last Year: Never true  ?Transportation  Needs: Unmet Transportation Needs  ? Lack of Transportation (Medical): Yes  ? Lack of Transportation (Non-Medical): Yes  ?Physical Activity: Not on file  ?Stress: Not on file  ?Social Connections: Not on file

## 2022-03-30 ENCOUNTER — Ambulatory Visit: Payer: Medicare (Managed Care) | Admitting: Nurse Practitioner

## 2022-03-31 ENCOUNTER — Encounter (HOSPITAL_COMMUNITY): Payer: Medicare Other | Admitting: Cardiology

## 2022-04-03 ENCOUNTER — Other Ambulatory Visit (HOSPITAL_COMMUNITY): Payer: Self-pay

## 2022-04-03 NOTE — Progress Notes (Signed)
Paramedicine Encounter ? ? ? Patient ID: [name], male    DOB: [birth year], 61 y.o.   MRN: [id number] ? ?Arrived for home visit for [name] today to review medications and fill pill box. I reviewed all meds and confirmed same. He has to re-apply for [facility], I will complete paperwork and turn in same for [name]. He has enough Entresto for two week supply. Pill box filled for one week and two days as we will meet in clinic next week to see Dr. Aundra Dubin. Appointments reviewed and confirmed. [name] reports feeling good with no complaints. He denies shortness of breath unless he walks far or goes up a lot of steps. He denies chest pain or any bleeding. He has a sleep study coming up on 5/16 also. He is aware and has transportation to all upcoming appointments. Home visit complete.  ? ?Refills: ?Potassium ?Lasix ?Carvedilol  ? ? ?ACTION: ?Home visit completed ? ? ? ? ? ? ?

## 2022-04-11 ENCOUNTER — Encounter (HOSPITAL_COMMUNITY): Payer: Medicare Other | Admitting: Cardiology

## 2022-04-11 ENCOUNTER — Ambulatory Visit (HOSPITAL_BASED_OUTPATIENT_CLINIC_OR_DEPARTMENT_OTHER): Payer: Medicare Other | Admitting: Internal Medicine

## 2022-04-11 ENCOUNTER — Other Ambulatory Visit (HOSPITAL_COMMUNITY): Payer: Self-pay

## 2022-04-11 NOTE — Progress Notes (Signed)
Paramedicine Encounter ? ? ? Patient ID: [name], male    DOB: Jan 05, 1961, 61 y.o.   MRN: [id number] ? ? ?Arrived for home visit for [name] who missed his appointment with Dr. Aundra Dubin at 1140. He reports he took an extra seroquel and over slept. I advised him to be sure he is only taking his medications as prescribed and how I place them in his pill box to ensure safe dosing of his medications. He verbalized agreement. We rescheduled his visit with Dr. Aundra Dubin and his sleep study. We reviewed appointments and I wrote them down and placed them on his fridge for reminders.  ? ?He denied shortness of breath, dizziness, chest pain or swelling. Weight is stable. No lower leg edema noted. Lungs clear. Vitals obtained. CBG-233 (no insulin/after a meal) ? ?He is continuing to take his Ozempic weekly and reports no blood sugar drops and wears his freestyle to monitor his glucose daily.  ? ?I reviewed medications and filled pill box for one week. He will need a few medications added to box once he picks them up from [place] today. Note left for instructions of where they go, he agreed with plan.  ? ?I plan to see [name] in one week in the home. Home visit complete.  ? ? ?-Entresto application being completed.  ?-Refills: ?Carvedilol ?Lasix ?Potassium  ? ? ?Patient Care Team: ?Teena Dunk, NP as PCP - General (Nurse Practitioner) ?[name], MD as PCP - Cardiology (Cardiology) ?[name], MD as PCP - Advanced Heart Failure (Cardiology) ?[name], MD as PCP - Electrophysiology (Cardiology) ?[name], LCSW as Education officer, museum (Licensed Holiday representative) ?[name], MD as Consulting Physician (Gastroenterology) ? ?Patient Active Problem List  ? Diagnosis Date Noted  ? Presence of Watchman left atrial appendage closure device 10/28/2021  ? Atrial fibrillation ([place]) 10/27/2021  ? Melena   ? Chronic diastolic CHF (congestive heart failure) ([place])   ? GI bleed  06/29/2021  ? Severe anemia 06/29/2021  ? Adenomatous polyp of ascending colon   ? 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  ? Acute blood loss anemia   ? Angiodysplasia of stomach   ? Chronic anticoagulation   ? CHF exacerbation ([place]) 06/15/2020  ? AKI (acute kidney injury) ([place]) 06/15/2020  ? CKD (chronic kidney disease), stage III ([place]) 06/15/2020  ? Anemia due to chronic blood loss   ? Gastric AVM   ? Angiodysplasia of duodenum with hemorrhage   ? Acute on chronic systolic (congestive) heart failure ([place]) 04/05/2020  ? Anemia 04/05/2020  ? PAF (paroxysmal atrial fibrillation) ([place]) 04/05/2020  ? OSA on CPAP 04/05/2020  ? Syncope and collapse 04/05/2020  ? Type 2 diabetes mellitus with hyperglycemia, with long-term current use of insulin ([place]) 12/03/2019  ? Diabetic polyneuropathy associated with type 2 diabetes mellitus ([place]) 12/03/2019  ? Erectile dysfunction 12/03/2019  ? Paranoid schizophrenia, chronic condition ([place]) 01/26/2015  ? Severe recurrent major depressive disorder with psychotic features ([place]) 01/26/2015  ? GAD (generalized anxiety disorder) 01/26/2015  ? OCD (obsessive compulsive disorder) 01/26/2015  ? Panic disorder without agoraphobia 01/26/2015  ? Insomnia 01/26/2015  ? ? ?Current Outpatient Medications:  ?  acetaminophen (TYLENOL) 500 MG tablet, Take 2 tablets (1,000 mg total) by mouth 2 (two) times daily as needed., Disp: 180 tablet, Rfl: 3 ?  albuterol (VENTOLIN HFA) 108 (90 Base) MCG/ACT inhaler, Inhale 2 puffs into the lungs every 6 (six) hours as needed for wheezing or shortness  of breath., Disp: 18 g, Rfl: 1 ?  amoxicillin (AMOXIL) 500 MG capsule, Take 4 capsules (2,000 mg total) by mouth as directed. Take 4 tablets 1 hour prior to dental work, including cleanings. (Patient not taking: Reported on 02/02/2022), Disp: 12 capsule, Rfl: 12 ?  atorvastatin (LIPITOR) 40 MG tablet, Take 1 tablet (40 mg total) by mouth daily at 6 PM., Disp: 90 tablet, Rfl: 1 ?  blood  glucose meter kit and supplies KIT, Dispense based on patient and insurance preference. Use up to four times daily as directed. (FOR ICD-9 250.00, 250.01)., Disp: 1 each, Rfl: 0 ?  carvedilol (COREG) 6.25 MG tablet, Take 1 tablet (6.25 mg total) by mouth 2 (two) times daily with a meal., Disp: 60 tablet, Rfl: 3 ?  clopidogrel (PLAVIX) 75 MG tablet, Take 1 tablet (75 mg total) by mouth daily., Disp: 90 tablet, Rfl: 1 ?  Continuous Blood Gluc Receiver (FREESTYLE LIBRE 14 DAY READER) DEVI, 1 each by Does not apply route as needed., Disp: 1 each, Rfl: 11 ?  Continuous Blood Gluc Sensor (FREESTYLE LIBRE 14 DAY SENSOR) MISC, Use one applicator every 14 (fourteen) days., Disp: 6 each, Rfl: 3 ?  cyclobenzaprine (FLEXERIL) 10 MG tablet, Take 10 mg by mouth 3 (three) times daily as needed for muscle spasms., Disp: , Rfl:  ?  empagliflozin (JARDIANCE) 10 MG TABS tablet, Take 1 tablet (10 mg total) by mouth daily before breakfast., Disp: 30 tablet, Rfl: 6 ?  ferrous sulfate 325 (65 FE) MG tablet, Take 1 tablet (325 mg total) by mouth every other day., Disp: 30 tablet, Rfl: 1 ?  furosemide (LASIX) 40 MG tablet, Take 1.5 tablets (60 mg total) by mouth 2 (two) times daily., Disp: 90 tablet, Rfl: 3 ?  gabapentin (NEURONTIN) 800 MG tablet, Take 1 tablet (800 mg total) by mouth every 8 (eight) hours., Disp: 270 tablet, Rfl: 1 ?  glipiZIDE (GLUCOTROL) 10 MG tablet, Take 1 tablet (10 mg total) by mouth 2 (two) times daily before a meal., Disp: 180 tablet, Rfl: 1 ?  insulin glargine (LANTUS) 100 UNIT/ML injection, Inject 0.2 mLs (20 Units total) into the skin daily., Disp: 20 mL, Rfl: 1 ?  Insulin Lispro Prot & Lispro (HUMALOG MIX 75/25 KWIKPEN) (75-25) 100 UNIT/ML Kwikpen, Inject 30 Units into the skin 2 (two) times daily., Disp: 54 mL, Rfl: 1 ?  Insulin Syringe-Needle U-100 (TRUEPLUS INSULIN SYRINGE) 31G X 5/16" 0.3 ML MISC, USE 3 TIMES DAILY TO INJECT INSULIN, Disp: 300 each, Rfl: 3 ?  metFORMIN (GLUCOPHAGE) 500 MG tablet, Take 1  tablet (500 mg total) by mouth 2 (two) times daily with a meal., Disp: 180 tablet, Rfl: 1 ?  Multiple Vitamins-Minerals (CENTRUM SILVER 50+MEN) TABS, Take 1 tablet by mouth daily., Disp: , Rfl:  ?  olopatadine (PATADAY) 0.1 % ophthalmic solution, Place 1 drop into both eyes 2 (two) times daily., Disp: 5 mL, Rfl: 12 ?  Oxycodone HCl 10 MG TABS, Take 10 mg by mouth 3 (three) times daily., Disp: , Rfl:  ?  pantoprazole (PROTONIX) 40 MG tablet, Take 1 tablet (40 mg total) by mouth daily., Disp: 90 tablet, Rfl: 1 ?  potassium chloride SA (KLOR-CON M) 20 MEQ tablet, TAKE 2 TABLETS BY MOUTH ONCE DAILY, Disp: 60 tablet, Rfl: 3 ?  QUEtiapine (SEROQUEL) 300 MG tablet, Take 1.5 tablets (450 mg total) by mouth at bedtime., Disp: 135 tablet, Rfl: 1 ?  sacubitril-valsartan (ENTRESTO) 24-26 MG, Take 1 tablet by mouth 2 (two) times daily., Disp:  180 tablet, Rfl: 3 ?  Semaglutide, 1 MG/DOSE, (OZEMPIC, 1 MG/DOSE,) 2 MG/1.5ML SOPN, Inject 1 mg into the skin once a week., Disp: , Rfl:  ?  spironolactone (ALDACTONE) 25 MG tablet, Take 0.5 tablets (12.5 mg total) by mouth at bedtime., Disp: 45 tablet, Rfl: 3 ?  tadalafil (CIALIS) 20 MG tablet, Take 20 mg by mouth daily as needed for erectile dysfunction., Disp: , Rfl:  ?  TRUEPLUS PEN NEEDLES 31G X 6 MM MISC, USE AS DIRECTED, Disp: 100 each, Rfl: 0 ?No Known Allergies ? ? ?Social History  ? ?Socioeconomic History  ? Marital status: Legally Separated  ?  Spouse name: Not on file  ? Number of children: Not on file  ? Years of education: Not on file  ? Highest education level: Not on file  ?Occupational History  ? Not on file  ?Tobacco Use  ? Smoking status: Former  ? Smokeless tobacco: Never  ?Vaping Use  ? Vaping Use: Never used  ?Substance and Sexual Activity  ? Alcohol use: Not Currently  ?  Alcohol/week: 2.0 standard drinks  ?  Types: 2 Cans of beer per week  ? Drug use: No  ? Sexual activity: Yes  ?  Birth control/protection: None  ?Other Topics Concern  ? Not on file  ?Social History  Narrative  ? Not on file  ? ?Social Determinants of Health  ? ?Financial Resource Strain: Not on file  ?Food Insecurity: No Food Insecurity  ? Worried About Charity fundraiser in the Last Year: Never true  ? Ran Out

## 2022-04-18 ENCOUNTER — Other Ambulatory Visit (HOSPITAL_COMMUNITY): Payer: Self-pay

## 2022-04-18 NOTE — Progress Notes (Unsigned)
Paramedicine Encounter    Patient ID: [name], male    DOB: [birth year], 61 y.o.   MRN: [id number]  Arrived for home visit for [name] reporting to feel okay but feeling bloated and constipated. He reports having some left ankle swelling with pain in his feet and legs. He has had no missed doses of his medications. He denied any blood in his stool and no chest pain or shortness of breath. I reminded him to avoid extra sodium and fluid. He agreed.   Vitals and assessment obtained.  CBG- 127 Weight- 318lbs  Lung sounds clear.   [name] reports a Higher education careers adviser from [employer] came out today and reports his A1C is 6.6 which is much improved.   Meds reviewed and confirmed pill box filled.   Needs- Entresto Tuesday morning & Monday eve.   Refills: Iron  Multivitamin  Jardiance  Gabapentin Entresto   I will call [employer] to confirm shipment.   Appointments reviewed and confirmed.  I will see [name] in one week. Home visit complete.        Patient Care Team: Bo Merino I, NP as PCP - General (Nurse Practitioner) [name], MD as PCP - Cardiology (Cardiology) [name], MD as PCP - Advanced Heart Failure (Cardiology) [name], MD as PCP - Electrophysiology (Cardiology) [name], LCSW as Social Worker (Licensed Clinical Social Worker) [name], MD as Consulting Physician (Gastroenterology)  Patient Active Problem List   Diagnosis Date Noted   Presence of Watchman left atrial appendage closure device 10/28/2021   Atrial fibrillation ([place]) 10/27/2021   Melena    Chronic diastolic CHF (congestive heart failure) ([place])    GI bleed 06/29/2021   Severe anemia 06/29/2021   Adenomatous polyp of ascending colon    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   Acute blood loss anemia    Angiodysplasia of stomach    Chronic anticoagulation    CHF exacerbation ([place]) 06/15/2020   AKI (acute kidney injury) ([place]) 06/15/2020    CKD (chronic kidney disease), stage III ([place]) 06/15/2020   Anemia due to chronic blood loss    Gastric AVM    Angiodysplasia of duodenum with hemorrhage    Acute on chronic systolic (congestive) heart failure ([place]) 04/05/2020   Anemia 04/05/2020   PAF (paroxysmal atrial fibrillation) ([place]) 04/05/2020   OSA on CPAP 04/05/2020   Syncope and collapse 04/05/2020   Type 2 diabetes mellitus with hyperglycemia, with long-term current use of insulin ([place]) 12/03/2019   Diabetic polyneuropathy associated with type 2 diabetes mellitus ([place]) 12/03/2019   Erectile dysfunction 12/03/2019   Paranoid schizophrenia, chronic condition ([place]) 01/26/2015   Severe recurrent major depressive disorder with psychotic features ([place]) 01/26/2015   GAD (generalized anxiety disorder) 01/26/2015   OCD (obsessive compulsive disorder) 01/26/2015   Panic disorder without agoraphobia 01/26/2015   Insomnia 01/26/2015    Current Outpatient Medications:    acetaminophen (TYLENOL) 500 MG tablet, Take 2 tablets (1,000 mg total) by mouth 2 (two) times daily as needed., Disp: 180 tablet, Rfl: 3   albuterol (VENTOLIN HFA) 108 (90 Base) MCG/ACT inhaler, Inhale 2 puffs into the lungs every 6 (six) hours as needed for wheezing or shortness of breath., Disp: 18 g, Rfl: 1   amoxicillin (AMOXIL) 500 MG capsule, Take 4 capsules (2,000 mg total) by mouth as directed. Take 4 tablets 1 hour prior to dental work, including cleanings. (Patient not taking: Reported on 02/02/2022), Disp: 12 capsule, Rfl: 12   atorvastatin (  LIPITOR) 40 MG tablet, Take 1 tablet (40 mg total) by mouth daily at 6 PM., Disp: 90 tablet, Rfl: 1   blood glucose meter kit and supplies KIT, Dispense based on patient and insurance preference. Use up to four times daily as directed. (FOR ICD-9 250.00, 250.01)., Disp: 1 each, Rfl: 0   carvedilol (COREG) 6.25 MG tablet, Take 1 tablet (6.25 mg total) by mouth 2 (two) times daily with a meal., Disp: 60 tablet, Rfl: 3    clopidogrel (PLAVIX) 75 MG tablet, Take 1 tablet (75 mg total) by mouth daily., Disp: 90 tablet, Rfl: 1   Continuous Blood Gluc Receiver (FREESTYLE LIBRE [place]) DEVI, 1 each by Does not apply route as needed., Disp: 1 each, Rfl: 11   Continuous Blood Gluc Sensor (FREESTYLE LIBRE 14 DAY SENSOR) MISC, Use one applicator every 14 (fourteen) days., Disp: 6 each, Rfl: 3   cyclobenzaprine (FLEXERIL) 10 MG tablet, Take 10 mg by mouth 3 (three) times daily as needed for muscle spasms., Disp: , Rfl:    empagliflozin (JARDIANCE) 10 MG TABS tablet, Take 1 tablet (10 mg total) by mouth daily before breakfast., Disp: 30 tablet, Rfl: 6   ferrous sulfate 325 (65 FE) MG tablet, Take 1 tablet (325 mg total) by mouth every other day., Disp: 30 tablet, Rfl: 1   furosemide (LASIX) 40 MG tablet, Take 1.5 tablets (60 mg total) by mouth 2 (two) times daily., Disp: 90 tablet, Rfl: 3   gabapentin (NEURONTIN) 800 MG tablet, Take 1 tablet (800 mg total) by mouth every 8 (eight) hours., Disp: 270 tablet, Rfl: 1   glipiZIDE (GLUCOTROL) 10 MG tablet, Take 1 tablet (10 mg total) by mouth 2 (two) times daily before a meal., Disp: 180 tablet, Rfl: 1   insulin glargine (LANTUS) 100 UNIT/ML injection, Inject 0.2 mLs (20 Units total) into the skin daily., Disp: 20 mL, Rfl: 1   Insulin Lispro Prot & Lispro (HUMALOG MIX 75/25 KWIKPEN) (75-25) 100 UNIT/ML Kwikpen, Inject 30 Units into the skin 2 (two) times daily., Disp: 54 mL, Rfl: 1   Insulin Syringe-Needle U-100 (TRUEPLUS INSULIN SYRINGE) 31G X 5/16" 0.3 ML MISC, USE 3 TIMES DAILY TO INJECT INSULIN, Disp: 300 each, Rfl: 3   metFORMIN (GLUCOPHAGE) 500 MG tablet, Take 1 tablet (500 mg total) by mouth 2 (two) times daily with a meal., Disp: 180 tablet, Rfl: 1   Multiple Vitamins-Minerals (CENTRUM SILVER 50+MEN) TABS, Take 1 tablet by mouth daily., Disp: , Rfl:    olopatadine (PATADAY) 0.1 % ophthalmic solution, Place 1 drop into both eyes 2 (two) times daily., Disp: 5 mL, Rfl: 12    Oxycodone HCl 10 MG TABS, Take 10 mg by mouth 3 (three) times daily., Disp: , Rfl:    pantoprazole (PROTONIX) 40 MG tablet, Take 1 tablet (40 mg total) by mouth daily., Disp: 90 tablet, Rfl: 1   potassium chloride SA (KLOR-CON M) 20 MEQ tablet, TAKE 2 TABLETS BY MOUTH ONCE DAILY, Disp: 60 tablet, Rfl: 3   QUEtiapine (SEROQUEL) 300 MG tablet, Take 1.5 tablets (450 mg total) by mouth at bedtime., Disp: 135 tablet, Rfl: 1   sacubitril-valsartan (ENTRESTO) 24-26 MG, Take 1 tablet by mouth 2 (two) times daily., Disp: 180 tablet, Rfl: 3   Semaglutide, 1 MG/DOSE, (OZEMPIC, 1 MG/DOSE,) 2 MG/1.5ML SOPN, Inject 1 mg into the skin once a week., Disp: , Rfl:    spironolactone (ALDACTONE) 25 MG tablet, Take 0.5 tablets (12.5 mg total) by mouth at bedtime., Disp: 45 tablet, Rfl:  3   tadalafil (CIALIS) 20 MG tablet, Take 20 mg by mouth daily as needed for erectile dysfunction., Disp: , Rfl:    TRUEPLUS PEN NEEDLES 31G X 6 MM MISC, USE AS DIRECTED, Disp: 100 each, Rfl: 0 No Known Allergies   Social History   Socioeconomic History   Marital status: Legally Separated    Spouse name: Not on file   Number of children: Not on file   Years of education: Not on file   Highest education level: Not on file  Occupational History   Not on file  Tobacco Use   Smoking status: Former   Smokeless tobacco: Never  Vaping Use   Vaping Use: Never used  Substance and Sexual Activity   Alcohol use: Not Currently    Alcohol/week: 2.0 standard drinks    Types: 2 Cans of beer per week   Drug use: No   Sexual activity: Yes    Birth control/protection: None  Other Topics Concern   Not on file  Social History Narrative   Not on file   Social Determinants of Health   Financial Resource Strain: Not on file  Food Insecurity: No Food Insecurity   Worried About Running Out of Food in the Last Year: Never true   Ran Out of Food in the Last Year: Never true  Transportation Needs: Unmet Transportation Needs   Lack of  Transportation (Medical): Yes   Lack of Transportation (Non-Medical): Yes  Physical Activity: Not on file  Stress: Not on file  Social Connections: Not on file  Intimate Partner Violence: Not on file    Physical Exam      Future Appointments  Date Time Provider [place]  04/26/2022  1:30 PM [facility]  05/05/2022 10:40 AM Bo Merino I, NP [facility] None  05/11/2022  8:00 PM Deneise Lever, MD [facility]  06/02/2022  7:40 AM [facility] DEVICE REMOTES [facility]  06/06/2022  2:20 PM [name], MD [facility] None  09/01/2022  7:40 AM [facility] DEVICE REMOTES [facility]  12/01/2022  7:40 AM [facility] DEVICE REMOTES [facility]  03/02/2023  7:40 AM [facility] DEVICE REMOTES [facility]  06/01/2023  7:40 AM [facility] DEVICE REMOTES [facility]     ACTION: Home visit completed

## 2022-04-19 ENCOUNTER — Other Ambulatory Visit: Payer: Self-pay | Admitting: Nurse Practitioner

## 2022-04-19 ENCOUNTER — Telehealth (HOSPITAL_COMMUNITY): Payer: Self-pay | Admitting: Pharmacy Technician

## 2022-04-19 ENCOUNTER — Telehealth (HOSPITAL_COMMUNITY): Payer: Self-pay

## 2022-04-19 ENCOUNTER — Other Ambulatory Visit (HOSPITAL_COMMUNITY): Payer: Self-pay

## 2022-04-19 NOTE — Telephone Encounter (Signed)
I called [employer] to check on the status of the shipment of [name]. They report they have no record of his application on file. I submitted the application to the AHF clinic last week and they faxed same. I will follow up with clinic for same. I will obtain sameples for [name] in the mean time. Call complete.

## 2022-04-19 NOTE — Telephone Encounter (Signed)
Advanced Heart Failure Patient Advocate Encounter  The patient was approved for a [employer] grant that will help cover the cost of Entresto and Jardiance. Total amount awarded, $10,000. Eligibility, 03/20/22 - 03/20/23.  ID [id number]  BIN [id number]  PCN [id number]  Group [id number]  Copy of grant sent to [facility] (EMT).  Charlann Boxer, CPhT

## 2022-04-21 NOTE — Progress Notes (Unsigned)
[facility] [place]                                     Cardiology Office Note:    Date:  04/26/2022   ID:  [name], DOB [birth year], MRN [id number]  PCP:  Teena Dunk, NP  [facility] Cardiologist:  [name], MD  [facility] Electrophysiologist:  [name], MD   Referring MD: [name], NP   6 month s/p LAAO   History of Present Illness:    [name] is a 61 y.o. male with a hx of chronic systolic CHF with an initial LVEF at 25-30% with diffuse hypokinesis, CAD s/p OM2 PCI 2012, DM2, schizophrenia and paroxysmal atrial fibrillation intolerant to anticoagulation due to GI bleeding s/p LAAO with Watchman (10/27/21) who presents to clinic for follow up.  He is followed extensively by the [facility] due to systolic CHF. Echo 09/2019 showed EF 25-30% with diffuse hypokinesis. LHC performed at that time which showed occluded OM2 at prior stent, 90% D1 stenosis, and extensive diffuse RCA disease with no intervention performed. He was thought to be in paroxysmal atrial fibrillation and Eliquis was started. Repeat echo 5/21 showed EF 30% with diffuse hypokinesis, mild LVH, PASP 38, mildly decreased RV systolic function, IVC dilated. Biotronik ICD placed 08/2020.    He was hospitalized in 06/16/21 with upper GI bleeding and CHF exacerbation. EGD showed duodenal AVMs, treated with APC. He was diuresed for CHF. Eliquis was held but restarted at d/c.     Given his GI bleeding, he was referred for consideration of LAAO closure with Watchman.   He underwent successful left atrial appendage occlusive device placement with 53m FLX Watchman per Dr. LQuentin Oreon 10/27/21. He had an issue with groin bleeding after the procedure at which time a Femstop was placed. He was discharged on Eliquis 553mBID monotherapy. He underwent 45 day TEE on 12/02/21 which showed an adequate seal and he was transitioned to  Plavix 7510maily to complete 6 months post implant.   Today the patient presents to clinic for follow up. No CP or SOB. No LE edema, orthopnea or PND. No dizziness or syncope. No blood in stool or urine. No palpitations. No recent issues with bleeding.      Past Medical History:  Diagnosis Date   AICD (automatic cardioverter/defibrillator) present    Anxiety    Atrial fibrillation (HCC)    CAD (coronary artery disease)    Cardiomyopathy (HCC)    [place]   Depression    Diabetes mellitus    Erectile dysfunction 11/2019   GI bleeding    H/O right heart catheterization 09/2019   Hypertension    Presence of permanent cardiac pacemaker    Presence of Watchman left atrial appendage closure device 10/27/2021   27 mm Watchman Flex Device per Dr[facility] LamQuentin [facility]reSchizophrenia [facility]  Sleep apnea    uses cpap    Past Surgical History:  Procedure Laterality Date   BIOPSY  04/19/2021   Procedure: BIOPSY;  Surgeon: DanDoran StablerD;  [facility];  Service: Gastroenterology;;   COLONOSCOPY WITH PROPOFOL N/A 04/16/2021   Procedure: COLONOSCOPY WITH PROPOFOL;  Surgeon: BeaThornton ParkD;  [facility];  Laterality: N/A;   CORONARY STENT PLACEMENT  ENTEROSCOPY N/A 04/08/2020   Procedure: ENTEROSCOPY;  Surgeon: Doran Stabler, MD;  Location: [facility] END[facility]COPY;  Service: Gastroenterology;  Laterality: N/A;   ENTEROSCOPY N/A 06/17/2020   Procedure: ENTEROSCOPY;  Surgeon: Irene Shipper, MD;  Location: [facility] ENDOSCOPY;  Service: Endoscopy;  Laterality: N/A;   ENTEROSCOPY N/A 04/15/2021   Procedure: ENTEROSCOPY;  Surgeon: [name], MD;  Location: [address];  Service: Gastroenterology;  Laterality: N/A;   ENTEROSCOPY N/A 04/19/2021   Procedure: ENTEROSCOPY;  Surgeon: Doran Stabler, MD;  Location: [place];  Service: Gastroenterology;  Laterality: N/A;   ENTEROSCOPY N/A 07/01/2021   Procedure:  ENTEROSCOPY;  Surgeon: Jerene Bears, MD;  Location: [facility] ENDOSCOPY;  Service: Gastroenterology;  Laterality: N/A;   GIVENS CAPSULE STUDY  04/16/2021   Procedure: GIVENS CAPSULE STUDY;  Surgeon: [name], MD;  Location: [place];  Service: Gastroenterology;;   HEMOSTASIS CLIP PLACEMENT  04/08/2020   Procedure: HEMOSTASIS CLIP PLACEMENT;  Surgeon: Doran Stabler, MD;  Location: [place];  Service: Gastroenterology;;   HEMOSTASIS CLIP PLACEMENT  07/01/2021   Procedure: HEMOSTASIS CLIP PLACEMENT;  Surgeon: Jerene Bears, MD;  Location: [place];  Service: Gastroenterology;;   HEMOSTASIS CONTROL  04/08/2020   Procedure: HEMOSTASIS CONTROL;  Surgeon: Doran Stabler, MD;  Location: [facility] ENDOSCOPY;  Service: Gastroenterology;;   HEMOSTASIS CONTROL  06/17/2020   Procedure: HEMOSTASIS CONTROL;  Surgeon: Irene Shipper, MD;  Location: [place];  Service: Endoscopy;;   HERNIA REPAIR     HOT HEMOSTASIS N/A 04/15/2021   Procedure: HOT HEMOSTASIS (ARGON PLASMA COAGULATION/BICAP);  Surgeon: [name], MD;  Location: [place];  Service: Gastroenterology;  Laterality: N/A;   HOT HEMOSTASIS N/A 07/01/2021   Procedure: HOT HEMOSTASIS (ARGON PLASMA COAGULATION/BICAP);  Surgeon: Jerene Bears, MD;  Location: [facility] ENDOSCOPY;  Service: Gastroenterology;  Laterality: N/A;   ICD IMPLANT N/A 09/03/2020   Procedure: ICD IMPLANT;  Surgeon: [name], MD;  Location: [place] CV LAB;  Service: Cardiovascular;  Laterality: N/A;   LEFT ATRIAL APPENDAGE OCCLUSION N/A 10/27/2021   Procedure: LEFT ATRIAL APPENDAGE OCCLUSION;  Surgeon: [name], MD;  Location: [place] CV LAB;  Service: Cardiovascular;  Laterality: N/A;   POLYPECTOMY  04/16/2021   Procedure: POLYPECTOMY;  Surgeon: [name], MD;  Location[facility] END[facility]COPY;  Service: Gastroenterology;;   RIGHT/LEFT HEART CATH AND CORONARY ANGIOGRAPHY N/A 10/14/2019   Procedure: RIGHT/LEFT HEART CATH AND CORONARY ANGIOGRAPHY;   Surgeon: Belva Crome, MD;  Location: [place] CV LAB;  Service: Cardiovascular;  Laterality: N/A;   SUBMUCOSAL TATTOO INJECTION  04/19/2021   Procedure: SUBMUCOSAL TATTOO INJECTION;  Surgeon: Doran Stabler, MD;  Location: [place];  Service: Gastroenterology;;   TEE WITHOUT CARDIOVERSION N/A 10/27/2021   Procedure: TRANSESOPHAGEAL ECHOCARDIOGRAM (TEE);  Surgeon: [name], MD;  Location: [place] CV LAB;  Service: Cardiovascular;  Laterality: N/A;   TEE WITHOUT CARDIOVERSION N/A 12/12/2021   Procedure: TRANSESOPHAGEAL ECHOCARDIOGRAM (TEE);  Surgeon: Geralynn Rile, MD;  Location: [place];  Service: Cardiovascular;  Laterality: N/A;    Current Medications: Current Meds  Medication Sig   acetaminophen (TYLENOL) 500 MG tablet Take 2 tablets (1,000 mg total) by mouth 2 (two) times daily as needed.   albuterol (VENTOLIN HFA) 108 (90 Base) MCG/ACT inhaler Inhale 2 puffs into the lungs every 6 (six) hours as needed for wheezing or shortness of breath.   aspirin EC 81 MG tablet Take 1 tablet (81 mg total) by mouth daily.  Swallow whole.   atorvastatin (LIPITOR) 40 MG tablet Take 1 tablet (40 mg total) by mouth daily at 6 PM.   blood glucose meter kit and supplies KIT Dispense based on patient and insurance preference. Use up to four times daily as directed. (FOR ICD-9 250.00, 250.01).   carvedilol (COREG) 6.25 MG tablet Take 1 tablet (6.25 mg total) by mouth 2 (two) times daily with a meal.   Continuous Blood Gluc Receiver (FREESTYLE LIBRE 14 DAY READER) DEVI 1 each by Does not apply route as needed.   Continuous Blood Gluc Sensor (FREESTYLE LIBRE 14 DAY SENSOR) MISC Use one applicator every 14 (fourteen) days.   cyclobenzaprine (FLEXERIL) 10 MG tablet Take 10 mg by mouth 3 (three) times daily as needed for muscle spasms.   diclofenac Sodium (VOLTAREN) 1 % GEL as needed for pain.   empagliflozin (JARDIANCE) 10 MG TABS tablet Take 1 tablet (10 mg total) by mouth daily  before breakfast.   furosemide (LASIX) 40 MG tablet Take 1.5 tablets (60 mg total) by mouth 2 (two) times daily.   gabapentin (NEURONTIN) 800 MG tablet TAKE 1 TABLET BY MOUTH EVERY 8 HOURS   insulin glargine (LANTUS) 100 UNIT/ML injection Inject 0.2 mLs (20 Units total) into the skin daily.   Insulin Syringe-Needle U-100 (TRUEPLUS INSULIN SYRINGE) 31G X 5/16" 0.3 ML MISC USE 3 TIMES DAILY TO INJECT INSULIN   Multiple Vitamins-Minerals (CENTRUM SILVER 50+MEN) TABS Take 1 tablet by mouth daily.   olopatadine (PATADAY) 0.1 % ophthalmic solution Place 1 drop into both eyes 2 (two) times daily.   Oxycodone HCl 10 MG TABS Take 10 mg by mouth 3 (three) times daily.   potassium chloride SA (KLOR-CON M) 20 MEQ tablet TAKE 2 TABLETS BY MOUTH ONCE DAILY   sacubitril-valsartan (ENTRESTO) 24-26 MG Take 1 tablet by mouth 2 (two) times daily.   Semaglutide, 1 MG/DOSE, (OZEMPIC, 1 MG/DOSE,) 2 MG/1.5ML SOPN Inject 1 mg into the skin once a week.   spironolactone (ALDACTONE) 25 MG tablet Take 0.5 tablets (12.5 mg total) by mouth at bedtime.   tadalafil (CIALIS) 20 MG tablet Take 20 mg by mouth daily as needed for erectile dysfunction.   TRUEPLUS PEN NEEDLES 31G X 6 MM MISC USE AS DIRECTED   [DISCONTINUED] clopidogrel (PLAVIX) 75 MG tablet Take 1 tablet (75 mg total) by mouth daily.     Allergies:   Patient has no known allergies.   Social History   Socioeconomic History   Marital status: Legally Separated    Spouse name: Not on file   Number of children: Not on file   Years of education: Not on file   Highest education level: Not on file  Occupational History   Not on file  Tobacco Use   Smoking status: Former   Smokeless tobacco: Never  Vaping Use   Vaping Use: Never used  Substance and Sexual Activity   Alcohol use: Not Currently    Alcohol/week: 2.0 standard drinks    Types: 2 Cans of beer per week   Drug use: No   Sexual activity: Yes    Birth control/protection: None  Other Topics Concern    Not on file  Social History Narrative   Not on file   Social Determinants of Health   Financial Resource Strain: Not on file  Food Insecurity: No Food Insecurity   Worried About [place] in the Last Year: Never true   [place] in the Last Year: Never true  Transportation Needs: Public librarian (Medical): Yes   Lack of Transportation (Non-Medical): Yes  Physical Activity: Not on file  Stress: Not on file  Social Connections: Not on file     Family History: The patient's family history includes Heart failure in his mother; Mental illness in his sister and sister.  ROS:   Please see the history of present illness.    All other systems reviewed and are negative.  EKGs/Labs/Other Studies Reviewed:    The following studies were reviewed today:  TEE 12/12/21 IMPRESSIONS   1. 27 mm Watchman FLX. 2D max diameter 23.5 mm (13% compression). No  device thrombus or device leak. The device is well seated. No left  atrial/left atrial appendage thrombus was detected.   2. Left ventricular ejection fraction, by estimation, is 50 to 55%. The  left ventricle has low normal function. The left ventricle demonstrates  global hypokinesis.   3. Right ventricular systolic function is normal. The right ventricular  size is normal.   4. The mitral valve is grossly normal. Trivial mitral valve  regurgitation. No evidence of mitral stenosis.   5. The aortic valve is tricuspid. There is mild calcification of the  aortic valve. Aortic valve regurgitation is mild.   6. There is mild (Grade II) layered plaque involving the descending  aorta.   EKG:  EKG is NOT ordered today.   Recent Labs: 06/30/2021: ALT 17 07/20/2021: B Natriuretic Peptide 103.1 09/30/2021: TSH 2.340 11/24/2021: Hemoglobin 13.8; Platelets 245 12/26/2021: BUN 16; Creatinine, Ser 1.06; Potassium 3.9; Sodium 140  Recent Lipid Panel    Component Value Date/Time   CHOL 139  11/04/2021 1201   TRIG 275 (H) 11/04/2021 1201   HDL 31 (L) 11/04/2021 1201   CHOLHDL 4.5 11/04/2021 1201   CHOLHDL 3.3 06/06/2021 1444   VLDL 23 06/06/2021 1444   LDLCALC 64 11/04/2021 1201     Risk Assessment/Calculations:    CHA2DS2-VASc Score = 4   This indicates a 4.8% annual risk of stroke. The patient's score is based upon: CHF History: 1 HTN History: 1 Diabetes History: 1 Stroke History: 0 Vascular Disease History: 1 Age Score: 0 Gender Score: 0      Physical Exam:    VS:  BP (!) 144/80   Pulse 73   Ht '6\' 2"'  (1.88 m)   Wt (!) 313 lb (142 kg)   SpO2 96%   BMI 40.19 kg/m     Wt Readings from Last 3 Encounters:  04/26/22 (!) 313 lb (142 kg)  04/18/22 (!) 318 lb (144.2 kg)  04/11/22 (!) 318 lb (144.2 kg)     GEN:  Well nourished, well developed in no acute distress, obese HEENT: Normal NECK: No JVD LYMPHATICS: No lymphadenopathy CARDIAC: RRR, no murmurs, rubs, gallops RESPIRATORY:  Clear to auscultation without rales, wheezing or rhonchi  ABDOMEN: Soft, non-tender, non-distended MUSCULOSKELETAL:  No edema; No deformity  SKIN: Warm and dry NEUROLOGIC:  Alert and oriented x 3 PSYCHIATRIC:  Normal affect   ASSESSMENT:    1. Presence of Watchman left atrial appendage closure device   2. PAF (paroxysmal atrial fibrillation) ([place])   3. H/O: upper GI bleed    PLAN:    In order of problems listed above:  PAF with GI bleeding s/p LAAO with Watchman (10/27/21): he is doing well. He underwent 45 day TEE on 12/02/21 which showed an adequate seal and he was transitioned to Plavix 31m daily to complete 6 months post implant.  He is now at the 6 month mark and he can stop Plavix and start a baby aspirin to continue indefinitely. I discussed that he no longer needs to continue SBE prophylaxis. He will continue regular follow up with Dr. Aundra Dubin.     Medication Adjustments/Labs and Tests Ordered: Current medicines are reviewed at length with the patient today.   Concerns regarding medicines are outlined above.  No orders of the defined types were placed in this encounter.  Meds ordered this encounter  Medications   aspirin EC 81 MG tablet    Sig: Take 1 tablet (81 mg total) by mouth daily. Swallow whole.    Dispense:  90 tablet    Refill:  3    Order Specific Question:   Supervising Provider    Answer:   Sherren Mocha [[id number]]    Patient Instructions  Medication Instructions:  Your physician has recommended you make the following change in your medication:  STOP: amoxicillin  STOP: clopidogrel (Plavix)  START: Asprin  *If you need a refill on your cardiac medications before your next appointment, please call your pharmacy*   Lab Work: NONE If you have labs (blood work) drawn today and your tests are completely normal, you will receive your results only by: [place] (if you have MyChart) OR A paper copy in the mail If you have any lab test that is abnormal or we need to change your treatment, we will call you to review the results.   Testing/Procedures: NONE   Follow-Up: At [facility], you and your health needs are our priority.  As part of our continuing mission to provide you with exceptional heart care, we have created designated Provider Care Teams.  These Care Teams include your primary Cardiologist (physician) and Advanced Practice Providers (APPs -  Physician Assistants and Nurse Practitioners) who all work together to provide you with the care you need, when you need it.  We recommend signing up for the patient portal called "MyChart".  Sign up information is provided on this After Visit Summary.  MyChart is used to connect with patients for Virtual Visits (Telemedicine).  Patients are able to view lab/test results, encounter notes, upcoming appointments, etc.  Non-urgent messages can be sent to your provider as well.   To learn more about what you can do with MyChart, go to [email or web address].     Important  Information About Sugar         Signed, Angelena Form, PA-C  04/26/2022 2:23 PM    [facility]

## 2022-04-25 ENCOUNTER — Other Ambulatory Visit (HOSPITAL_COMMUNITY): Payer: Self-pay

## 2022-04-25 ENCOUNTER — Other Ambulatory Visit (HOSPITAL_COMMUNITY): Payer: Self-pay | Admitting: *Deleted

## 2022-04-25 MED ORDER — ENTRESTO 24-26 MG PO TABS: 1.0000 | ORAL_TABLET | Freq: Two times a day (BID) | ORAL | 3 refills | Status: DC

## 2022-04-25 NOTE — Progress Notes (Signed)
Paramedicine Encounter    Patient ID: [name], male    DOB: [birth year], 61 y.o.   MRN: [id number]  Arrived for home visit for [facility] today. Todays visit is a medication review only. I reviewed all medications and confirmed dosing of same and filled pill box for one week.   Refills needed at [facility]  I called in refills for same and they are working on them for [place] to pick up later today or tomorrow. He agreed with plan.   We reviewed appointments and confirmed same.   Home visit complete. I will see [name] for full visit in one week. [name], [place] 04/25/2022      ACTION: Home visit completed

## 2022-04-26 ENCOUNTER — Ambulatory Visit (INDEPENDENT_AMBULATORY_CARE_PROVIDER_SITE_OTHER): Payer: Medicare Other | Admitting: Physician Assistant

## 2022-04-26 VITALS — BP 144/80 | HR 73 | Ht 74.0 in | Wt 313.0 lb

## 2022-04-26 DIAGNOSIS — I480 Paroxysmal atrial fibrillation: Secondary | ICD-10-CM

## 2022-04-26 DIAGNOSIS — Z95818 Presence of other cardiac implants and grafts: Secondary | ICD-10-CM | POA: Diagnosis not present

## 2022-04-26 DIAGNOSIS — Z8719 Personal history of other diseases of the digestive system: Secondary | ICD-10-CM

## 2022-04-26 MED ORDER — ASPIRIN 81 MG PO TBEC
81.0000 mg | DELAYED_RELEASE_TABLET | Freq: Every day | ORAL | 3 refills | Status: DC
Start: 2022-04-26 — End: 2023-09-28

## 2022-04-26 NOTE — Patient Instructions (Addendum)
Medication Instructions:  Your physician has recommended you make the following change in your medication:  STOP: amoxicillin  STOP: clopidogrel (Plavix)  START: Asprin  *If you need a refill on your cardiac medications before your next appointment, please call your pharmacy*   Lab Work: NONE If you have labs (blood work) drawn today and your tests are completely normal, you will receive your results only by: [place] (if you have MyChart) OR A paper copy in the mail If you have any lab test that is abnormal or we need to change your treatment, we will call you to review the results.   Testing/Procedures: NONE   Follow-Up: At [facility], you and your health needs are our priority.  As part of our continuing mission to provide you with exceptional heart care, we have created designated Provider Care Teams.  These Care Teams include your primary Cardiologist (physician) and Advanced Practice Providers (APPs -  Physician Assistants and Nurse Practitioners) who all work together to provide you with the care you need, when you need it.  We recommend signing up for the patient portal called "MyChart".  Sign up information is provided on this After Visit Summary.  MyChart is used to connect with patients for Virtual Visits (Telemedicine).  Patients are able to view lab/test results, encounter notes, upcoming appointments, etc.  Non-urgent messages can be sent to your provider as well.   To learn more about what you can do with MyChart, go to [email or web address].     Important Information About Sugar

## 2022-04-27 ENCOUNTER — Other Ambulatory Visit (HOSPITAL_COMMUNITY): Payer: Self-pay

## 2022-04-27 NOTE — Progress Notes (Signed)
Paramedicine Encounter    Patient ID: [name], male    DOB: 24-Feb-1961, 61 y.o.   MRN: [id number]   Arrived to [name] for a medication review and reconcile. He was taken off Plavix yesterday. I was able to remove same from pill box and add Aspirin as instructed. I was also able to refill two pill boxes to get him through the next two weeks.   I assisted him with scheduling a foot and ankle appointment for next week as he is having severe foot pain in both feet around the arches. He reports he is taking all gabapentins as prescribed 3 x's daily plus Tylenol morning and night and his pain medication of oxycodone and it is not relieving his pain. I advised him to be careful taking that amount of medications daily and gave him some helpful tips to manage his pain. (RICE)    Refills: NONE   He placed his freestyle on himself and CBG today is 145.   We reviewed all appointments and confirmed same. I plan to see [name] in two weeks.   Visit complete.   [name], [place] 04/27/2022         ACTION: Home visit completed

## 2022-05-03 NOTE — Addendum Note (Signed)
Addended by: Tage Feggins E on: 05/03/2022 02:27 PM   Modules accepted: Orders

## 2022-05-03 NOTE — Telephone Encounter (Signed)
Spoke with [name], pt tolerating Ozempic '1mg'$  well. Has had some weight loss over the last few weeks, no low blood sugars or GI upset. Renewal rx already sent to pt assistance for higher '2mg'$  maintenance dose. Pt will start on this when next refill is due.

## 2022-05-05 ENCOUNTER — Ambulatory Visit: Payer: Medicare Other | Admitting: Podiatry

## 2022-05-05 ENCOUNTER — Encounter: Payer: Self-pay | Admitting: Nurse Practitioner

## 2022-05-05 ENCOUNTER — Telehealth: Payer: Self-pay

## 2022-05-05 ENCOUNTER — Ambulatory Visit (INDEPENDENT_AMBULATORY_CARE_PROVIDER_SITE_OTHER): Payer: Medicare Other | Admitting: Nurse Practitioner

## 2022-05-05 VITALS — BP 132/74 | HR 62 | Temp 96.9°F | Ht 74.0 in | Wt 320.1 lb

## 2022-05-05 DIAGNOSIS — Z794 Long term (current) use of insulin: Secondary | ICD-10-CM | POA: Diagnosis not present

## 2022-05-05 DIAGNOSIS — E119 Type 2 diabetes mellitus without complications: Secondary | ICD-10-CM

## 2022-05-05 DIAGNOSIS — G6289 Other specified polyneuropathies: Secondary | ICD-10-CM

## 2022-05-05 DIAGNOSIS — E1165 Type 2 diabetes mellitus with hyperglycemia: Secondary | ICD-10-CM | POA: Diagnosis not present

## 2022-05-05 LAB — POCT URINALYSIS DIP (CLINITEK)
Bilirubin, UA: NEGATIVE
Blood, UA: NEGATIVE
Glucose, UA: 100 mg/dL — AB
Ketones, POC UA: NEGATIVE mg/dL
Leukocytes, UA: NEGATIVE
Nitrite, UA: NEGATIVE
POC PROTEIN,UA: NEGATIVE
Spec Grav, UA: 1.01 (ref 1.010–1.025)
Urobilinogen, UA: 0.2 E.U./dL
pH, UA: 7 (ref 5.0–8.0)

## 2022-05-05 LAB — POCT GLYCOSYLATED HEMOGLOBIN (HGB A1C)
HbA1c POC (<> result, manual entry): 7 % (ref 4.0–5.6)
HbA1c, POC (controlled diabetic range): 7 % (ref 0.0–7.0)
HbA1c, POC (prediabetic range): 7 % — AB (ref 5.7–6.4)
Hemoglobin A1C: 7 % — AB (ref 4.0–5.6)

## 2022-05-05 LAB — GLUCOSE, POCT (MANUAL RESULT ENTRY): POC Glucose: 87 mg/dl (ref 70–99)

## 2022-05-05 MED ORDER — GABAPENTIN 800 MG PO TABS: 800.0000 mg | ORAL_TABLET | Freq: Four times a day (QID) | ORAL | 5 refills | Status: DC

## 2022-05-05 MED ORDER — CLOTRIMAZOLE-BETAMETHASONE 1-0.05 % EX CREA: 1.0000 "application " | TOPICAL_CREAM | Freq: Two times a day (BID) | CUTANEOUS | 0 refills | Status: DC

## 2022-05-05 NOTE — Progress Notes (Unsigned)
[place], [place]  [address] Phone:  [phone]   Fax:  [phone] Subjective:   Patient ID: [name], male    DOB: 06/09/1961, 61 y.o.   MRN: [id number]  Chief Complaint  Patient presents with   Follow-up    Pt is here for 3 month DM follow up visit. Pt stated he still has nerve pain patient is requesting a dosage increase   HPI [name] 61 y.o. male  has a past medical history of AICD (automatic cardioverter/defibrillator) present, Anxiety, Atrial fibrillation ([place]), CAD (coronary artery disease), Cardiomyopathy ([place]), CHF (congestive heart failure) ([place]) (09/2019), Depression, Diabetes mellitus, Erectile dysfunction (11/2019), GI bleeding, H/O right heart catheterization (09/2019), Hypertension, Presence of permanent cardiac pacemaker, Presence of Watchman left atrial appendage closure device (10/27/2021), Schizophrenia ([place]), and Sleep apnea. To the [facility] for reevaluation of chronic illness.   Diabetes Mellitus: Patient presents for follow up of diabetes. Symptoms: paresthesia of the feet. Symptoms have gradually worsened. Patient denies paresthesia of the feet.  Evaluation to date has been included: hemoglobin A1C.  Home sugars: BGs have been labile ranging between 87 and 175 . Treatment to date: no recent interventions.   Patient states that he has been monitoring diet, but exercise is limited due to increased pain in bilateral feet. Requesting increase in gabapentin dosage. Denies any other concerns today.  Denies any fatigue, chest pain, shortness of breath, HA or dizziness. Denies any blurred vision, numbness or tingling.  Past Medical History:  Diagnosis Date   AICD (automatic cardioverter/defibrillator) present    Anxiety    Atrial fibrillation (HCC)    CAD (coronary artery disease)    Cardiomyopathy (HCC)    CHF (congestive heart failure) ([place]) 09/2019   Depression    Diabetes mellitus    Erectile  dysfunction 11/2019   GI bleeding    H/O right heart catheterization 09/2019   Hypertension    Presence of permanent cardiac pacemaker    Presence of Watchman left atrial appendage closure device 10/27/2021   27 mm Watchman Flex Device per Dr. Quentin Ore   Schizophrenia [facility])    Sleep apnea    uses cpap    Past Surgical History:  Procedure Laterality Date   BIOPSY  04/19/2021   Procedure: BIOPSY;  Surgeon: Doran Stabler, MD;  Location: [facility] ENDOSCOPY;  Service: Gastroenterology;;   COLONOSCOPY WITH PROPOFOL N/A 04/16/2021   Procedure: COLONOSCOPY WITH PROPOFOL;  Surgeon: [name], MD;  Location: [place];  Service: Gastroenterology;  Laterality: N/A;   CORONARY STENT PLACEMENT     ENTEROSCOPY N/A 04/08/2020   Procedure: ENTEROSCOPY;  Surgeon: Doran Stabler, MD;  Location: [place];  Service: Gastroenterology;  Laterality: N/A;   ENTEROSCOPY N/A 06/17/2020   Procedure: ENTEROSCOPY;  Surgeon: Irene Shipper, MD;  Location: [facility] ENDOSCOPY;  Service: Endoscopy;  Laterality: N/A;   ENTEROSCOPY N/A 04/15/2021   Procedure: ENTEROSCOPY;  Surgeon: [name], MD;  Location: [place];  Service: Gastroenterology;  Laterality: N/A;   ENTEROSCOPY N/A 04/19/2021   Procedure: ENTEROSCOPY;  Surgeon: Doran Stabler, MD;  Location: [place];  Service: Gastroenterology;  Laterality: N/A;   ENTEROSCOPY N/A 07/01/2021   Procedure: ENTEROSCOPY;  Surgeon: Jerene Bears, MD;  Location: [facility] ENDOSCOPY;  Service: Gastroenterology;  Laterality: N/A;   GIVENS CAPSULE STUDY  04/16/2021   Procedure: GIVENS CAPSULE STUDY;  Surgeon: [name], MD;  Location: [place];  Service: Gastroenterology;;   HEMOSTASIS  CLIP PLACEMENT  04/08/2020   Procedure: HEMOSTASIS CLIP PLACEMENT;  Surgeon: Doran Stabler, MD;  Location: [place];  Service: Gastroenterology;;   HEMOSTASIS CLIP PLACEMENT  07/01/2021   Procedure: HEMOSTASIS CLIP PLACEMENT;  Surgeon: Jerene Bears, MD;  Location:  [facility] ENDOSCOPY;  Service: Gastroenterology;;   HEMOSTASIS CONTROL  04/08/2020   Procedure: HEMOSTASIS CONTROL;  Surgeon: Doran Stabler, MD;  Location: [facility] ENDOSCOPY;  Service: Gastroenterology;;   HEMOSTASIS CONTROL  06/17/2020   Procedure: HEMOSTASIS CONTROL;  Surgeon: Irene Shipper, MD;  Location: [place];  Service: Endoscopy;;   HERNIA REPAIR     HOT HEMOSTASIS N/A 04/15/2021   Procedure: HOT HEMOSTASIS (ARGON PLASMA COAGULATION/BICAP);  Surgeon: [name], MD;  Location: [place];  Service: Gastroenterology;  Laterality: N/A;   HOT HEMOSTASIS N/A 07/01/2021   Procedure: HOT HEMOSTASIS (ARGON PLASMA COAGULATION/BICAP);  Surgeon: Jerene Bears, MD;  Location: [name] ENDOSCOPY;  Service: Gastroenterology;  Laterality: N/A;   ICD IMPLANT N/A 09/03/2020   Procedure: ICD IMPLANT;  Surgeon: [name], MD;  Location: [place] CV LAB;  Service: Cardiovascular;  Laterality: N/A;   LEFT ATRIAL APPENDAGE OCCLUSION N/A 10/27/2021   Procedure: LEFT ATRIAL APPENDAGE OCCLUSION;  Surgeon: [name], MD;  Location: [place] CV LAB;  Service: Cardiovascular;  Laterality: N/A;   POLYPECTOMY  04/16/2021   Procedure: POLYPECTOMY;  Surgeon: [name], MD;  Location: [facility] ENDOSCOPY;  Service: Gastroenterology;;   RIGHT/LEFT HEART CATH AND CORONARY ANGIOGRAPHY N/A 10/14/2019   Procedure: RIGHT/LEFT HEART CATH AND CORONARY ANGIOGRAPHY;  Surgeon: Belva Crome, MD;  Location: [place] CV LAB;  Service: Cardiovascular;  Laterality: N/A;   SUBMUCOSAL TATTOO INJECTION  04/19/2021   Procedure: SUBMUCOSAL TATTOO INJECTION;  Surgeon: Doran Stabler, MD;  Location: [place];  Service: Gastroenterology;;   TEE WITHOUT CARDIOVERSION N/A 10/27/2021   Procedure: TRANSESOPHAGEAL ECHOCARDIOGRAM (TEE);  Surgeon: [name], MD;  Location: [place] CV LAB;  Service: Cardiovascular;  Laterality: N/A;   TEE WITHOUT CARDIOVERSION N/A 12/12/2021   Procedure: TRANSESOPHAGEAL  ECHOCARDIOGRAM (TEE);  Surgeon: Geralynn Rile, MD;  Location: [place];  Service: Cardiovascular;  Laterality: N/A;    Family History  Problem Relation Age of Onset   Heart failure Mother    Mental illness Sister    Mental illness Sister     Social History   Socioeconomic History   Marital status: Legally Separated    Spouse name: Not on file   Number of children: Not on file   Years of education: Not on file   Highest education level: Not on file  Occupational History   Not on file  Tobacco Use   Smoking status: Former   Smokeless tobacco: Never  Vaping Use   Vaping Use: Never used  Substance and Sexual Activity   Alcohol use: Not Currently    Alcohol/week: 2.0 standard drinks of alcohol    Types: 2 Cans of beer per week   Drug use: No   Sexual activity: Yes    Birth control/protection: None  Other Topics Concern   Not on file  Social History Narrative   Not on file   Social Determinants of Health   Financial Resource Strain: Low Risk  (12/23/2019)   Overall Financial Resource Strain (CARDIA)    Difficulty of Paying Living Expenses: Not very hard  Food Insecurity: No Food Insecurity (07/01/2021)   Hunger Vital Sign    Worried About Running Out of Food in the Last  Year: Never true    Ran Out of Food in the Last Year: Never true  Transportation Needs: Unmet Transportation Needs (07/01/2021)   PRAPARE - Hydrologist (Medical): Yes    Lack of Transportation (Non-Medical): Yes  Physical Activity: Not on file  Stress: Not on file  Social Connections: Not on file  Intimate Partner Violence: Not on file    Outpatient Medications Prior to Visit  Medication Sig Dispense Refill   acetaminophen (TYLENOL) 500 MG tablet Take 2 tablets (1,000 mg total) by mouth 2 (two) times daily as needed. 180 tablet 3   aspirin EC 81 MG tablet Take 1 tablet (81 mg total) by mouth daily. Swallow whole. 90 tablet 3   atorvastatin (LIPITOR) 40 MG tablet  Take 1 tablet (40 mg total) by mouth daily at 6 PM. 90 tablet 1   blood glucose meter kit and supplies KIT Dispense based on patient and insurance preference. Use up to four times daily as directed. (FOR ICD-9 250.00, 250.01). 1 each 0   carvedilol (COREG) 6.25 MG tablet Take 1 tablet (6.25 mg total) by mouth 2 (two) times daily with a meal. 60 tablet 3   Continuous Blood Gluc Receiver (FREESTYLE LIBRE 14 DAY READER) DEVI 1 each by Does not apply route as needed. 1 each 11   cyclobenzaprine (FLEXERIL) 10 MG tablet Take 10 mg by mouth 3 (three) times daily as needed for muscle spasms.     diclofenac Sodium (VOLTAREN) 1 % GEL as needed for pain.     empagliflozin (JARDIANCE) 10 MG TABS tablet Take 1 tablet (10 mg total) by mouth daily before breakfast. 30 tablet 6   furosemide (LASIX) 40 MG tablet Take 1.5 tablets (60 mg total) by mouth 2 (two) times daily. 90 tablet 3   insulin glargine (LANTUS) 100 UNIT/ML injection Inject 0.2 mLs (20 Units total) into the skin daily. 20 mL 1   Insulin Syringe-Needle U-100 (TRUEPLUS INSULIN SYRINGE) 31G X 5/16" 0.3 ML MISC USE 3 TIMES DAILY TO INJECT INSULIN 300 each 3   Multiple Vitamins-Minerals (CENTRUM SILVER 50+MEN) TABS Take 1 tablet by mouth daily.     olopatadine (PATADAY) 0.1 % ophthalmic solution Place 1 drop into both eyes 2 (two) times daily. 5 mL 12   Oxycodone HCl 10 MG TABS Take 10 mg by mouth 3 (three) times daily.     potassium chloride SA (KLOR-CON M) 20 MEQ tablet TAKE 2 TABLETS BY MOUTH ONCE DAILY 60 tablet 3   sacubitril-valsartan (ENTRESTO) 24-26 MG Take 1 tablet by mouth 2 (two) times daily. 180 tablet 3   Semaglutide, 2 MG/DOSE, (OZEMPIC, 2 MG/DOSE,) 8 MG/3ML SOPN Inject 2 mg into the skin once a week.     spironolactone (ALDACTONE) 25 MG tablet Take 0.5 tablets (12.5 mg total) by mouth at bedtime. 45 tablet 3   tadalafil (CIALIS) 20 MG tablet Take 20 mg by mouth daily as needed for erectile dysfunction.     TRUEPLUS PEN NEEDLES 31G X 6 MM  MISC USE AS DIRECTED 100 each 0   gabapentin (NEURONTIN) 800 MG tablet TAKE 1 TABLET BY MOUTH EVERY 8 HOURS 270 tablet 0   albuterol (VENTOLIN HFA) 108 (90 Base) MCG/ACT inhaler Inhale 2 puffs into the lungs every 6 (six) hours as needed for wheezing or shortness of breath. (Patient not taking: Reported on 05/05/2022) 18 g 1   Continuous Blood Gluc Sensor (FREESTYLE LIBRE 14 DAY SENSOR) MISC Use one applicator every 14 (fourteen)  days. 6 each 3   ferrous sulfate 325 (65 FE) MG tablet Take 1 tablet (325 mg total) by mouth every other day. 30 tablet 1   glipiZIDE (GLUCOTROL) 10 MG tablet Take 1 tablet (10 mg total) by mouth 2 (two) times daily before a meal. 180 tablet 1   Insulin Lispro Prot & Lispro (HUMALOG MIX 75/25 KWIKPEN) (75-25) 100 UNIT/ML Kwikpen Inject 30 Units into the skin 2 (two) times daily. 54 mL 1   metFORMIN (GLUCOPHAGE) 500 MG tablet Take 1 tablet (500 mg total) by mouth 2 (two) times daily with a meal. 180 tablet 1   pantoprazole (PROTONIX) 40 MG tablet Take 1 tablet (40 mg total) by mouth daily. 90 tablet 1   QUEtiapine (SEROQUEL) 300 MG tablet Take 1.5 tablets (450 mg total) by mouth at bedtime. 135 tablet 1   No facility-administered medications prior to visit.    No Known Allergies  Review of Systems  Constitutional:  Negative for chills, fever and malaise/fatigue.  Respiratory:  Negative for cough and shortness of breath.   Cardiovascular:  Negative for chest pain, palpitations and leg swelling.  Gastrointestinal:  Negative for abdominal pain, blood in stool, constipation, diarrhea, nausea and vomiting.  Musculoskeletal: Negative.   Skin: Negative.   Neurological:  Negative for dizziness, tingling, tremors, speech change, focal weakness, seizures, loss of consciousness, weakness and headaches.       See HPI  Psychiatric/Behavioral:  Negative for depression. The patient is not nervous/anxious.   All other systems reviewed and are negative.      Objective:     Physical Exam Vitals reviewed.  Constitutional:      General: He is not in acute distress.    Appearance: Normal appearance. He is obese.  HENT:     Head: Normocephalic.  Neck:     Vascular: No carotid bruit.  Cardiovascular:     Rate and Rhythm: Normal rate and regular rhythm.     Pulses: Normal pulses.     Heart sounds: Normal heart sounds.     Comments: No obvious peripheral edema Pulmonary:     Effort: Pulmonary effort is normal.     Breath sounds: Normal breath sounds.  Musculoskeletal:        General: No swelling, tenderness, deformity or signs of injury. Normal range of motion.     Cervical back: Normal range of motion and neck supple. No rigidity or tenderness.     Right lower leg: No edema.     Left lower leg: No edema.  Lymphadenopathy:     Cervical: No cervical adenopathy.  Skin:    General: Skin is warm and dry.     Capillary Refill: Capillary refill takes less than 2 seconds.  Neurological:     General: No focal deficit present.     Mental Status: He is alert and oriented to person, place, and time.  Psychiatric:        Mood and Affect: Mood normal.        Behavior: Behavior normal.        Thought Content: Thought content normal.        Judgment: Judgment normal.     BP 132/74 (BP Location: Right Arm, Patient Position: Sitting, Cuff Size: Large)   Pulse 62   Temp (!) 96.9 F (36.1 C)   Ht '6\' 2"'  (1.88 m)   Wt (!) 320 lb 2 oz (145.2 kg)   SpO2 100%   BMI 41.10 kg/m  Wt Readings from Last 3  Encounters:  05/05/22 (!) 320 lb 2 oz (145.2 kg)  04/26/22 (!) 313 lb (142 kg)  04/18/22 (!) 318 lb (144.2 kg)    Immunization History  Administered Date(s) Administered   Influenza,inj,Quad PF,6+ Mos 10/13/2019, 09/13/2020, 08/03/2021   PFIZER(Purple Top)SARS-COV-2 Vaccination 03/13/2020   Pneumococcal Polysaccharide-23 10/16/2019   Tdap 09/13/2020   Zoster Recombinat (Shingrix) 08/03/2021    Diabetic Foot Exam - Simple   No data filed     Lab Results   Component Value Date   TSH 2.340 09/30/2021   Lab Results  Component Value Date   WBC 6.2 11/24/2021   HGB 13.8 11/24/2021   HCT 43.4 11/24/2021   MCV 81 11/24/2021   PLT 245 11/24/2021   Lab Results  Component Value Date   NA 140 12/26/2021   K 3.9 12/26/2021   CO2 26 12/26/2021   GLUCOSE 96 12/26/2021   BUN 16 12/26/2021   CREATININE 1.06 12/26/2021   BILITOT 0.3 11/04/2021   ALKPHOS 118 11/04/2021   AST 18 11/04/2021   ALT 17 06/30/2021   PROT 7.8 11/04/2021   ALBUMIN 4.5 11/04/2021   CALCIUM 9.1 12/26/2021   ANIONGAP 10 12/26/2021   EGFR 74 11/24/2021   Lab Results  Component Value Date   CHOL 139 11/04/2021   CHOL 116 06/06/2021   CHOL 95 03/29/2021   Lab Results  Component Value Date   HDL 31 (L) 11/04/2021   HDL 35 (L) 06/06/2021   HDL 37 (L) 03/29/2021   Lab Results  Component Value Date   LDLCALC 64 11/04/2021   LDLCALC 58 06/06/2021   LDLCALC 39 03/29/2021   Lab Results  Component Value Date   TRIG 275 (H) 11/04/2021   TRIG 117 06/06/2021   TRIG 96 03/29/2021   Lab Results  Component Value Date   CHOLHDL 4.5 11/04/2021   CHOLHDL 3.3 06/06/2021   CHOLHDL 2.6 03/29/2021   Lab Results  Component Value Date   HGBA1C 7.0 (A) 05/05/2022   HGBA1C 7.0 05/05/2022   HGBA1C 7.0 (A) 05/05/2022   HGBA1C 7.0 05/05/2022       Assessment & Plan:   Problem List Items Addressed This Visit       Endocrine   Type 2 diabetes mellitus with hyperglycemia, with long-term current use of insulin (HCC) - Primary   Relevant Medications   gabapentin (NEURONTIN) 800 MG tablet, dosage increased   Other Relevant Orders   POCT glycosylated hemoglobin (Hb A1C) (Completed), 7.0, at goal   POCT glucose (manual entry) (Completed)   POCT URINALYSIS DIP (CLINITEK) (Completed) Encouraged continued diet and exercise efforts  Encouraged continued compliance with medication     Other Visit Diagnoses     Other polyneuropathy       Relevant Medications    gabapentin (NEURONTIN) 800 MG tablet, dosage increased Discussed non pharmacological methods for management of symptoms Informed to take OTC medications as needed    Follow up in 3 mths for reevaluation of neuropathy, sooner as needed     I have changed Chaske B. Schnelle's gabapentin. I am also having him maintain his blood glucose meter kit and supplies, Centrum Silver 50+Men, cyclobenzaprine, TRUEplus Pen Needles, FreeStyle Libre 14 Day Reader, acetaminophen, FreeStyle Libre 14 Day Sensor, atorvastatin, carvedilol, empagliflozin, furosemide, spironolactone, potassium chloride SA, albuterol, ferrous sulfate, glipiZIDE, insulin glargine, Insulin Lispro Prot & Lispro, Insulin Syringe-Needle U-100, metFORMIN, pantoprazole, QUEtiapine, tadalafil, Oxycodone HCl, olopatadine, Entresto, diclofenac Sodium, aspirin EC, and Ozempic (2 MG/DOSE).  Meds ordered this encounter  Medications  gabapentin (NEURONTIN) 800 MG tablet    Sig: Take 1 tablet (800 mg total) by mouth every 6 (six) hours.    Dispense:  120 tablet    Refill:  5     Teena Dunk, NP

## 2022-05-05 NOTE — Patient Instructions (Signed)
You were seen today in the [facility] for reevaluation of chronic illness. Labs were collected, results will be available via MyChart or, if abnormal, you will be contacted by clinic staff. You were prescribed medications, please take as directed. Please follow up in 3 mths for reevaluation of neuropathy

## 2022-05-09 NOTE — Telephone Encounter (Signed)
No additional notes needed  

## 2022-05-10 ENCOUNTER — Other Ambulatory Visit (HOSPITAL_COMMUNITY): Payer: Self-pay

## 2022-05-10 NOTE — Progress Notes (Signed)
Subjective: [name] presents today referred by Bo Merino I, NP for diabetic foot evaluation.  Patient relates to year history of diabetes.  Last A1c of 7  Patient denies any history of foot wounds.  Patient denies any history of numbness, tingling, burning, pins/needles sensations.  Past Medical History:  Diagnosis Date   AICD (automatic cardioverter/defibrillator) present    Anxiety    Atrial fibrillation (HCC)    CAD (coronary artery disease)    Cardiomyopathy (HCC)    CHF (congestive heart failure) ([place]) 09/2019   Depression    Diabetes mellitus    Erectile dysfunction 11/2019   GI bleeding    H/O right heart catheterization 09/2019   Hypertension    Presence of permanent cardiac pacemaker    Presence of Watchman left atrial appendage closure device 10/27/2021   27 mm Watchman Flex Device per Dr. Quentin Ore   Schizophrenia [facility])    Sleep apnea    uses cpap    Patient Active Problem List   Diagnosis Date Noted   Presence of Watchman left atrial appendage closure device 10/28/2021   Atrial fibrillation ([place]) 10/27/2021   Melena    Chronic diastolic CHF (congestive heart failure) (HCC)    GI bleed 06/29/2021   Severe anemia 06/29/2021   Adenomatous polyp of ascending colon    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   Acute blood loss anemia    Angiodysplasia of stomach    Chronic anticoagulation    CHF exacerbation ([place]) 06/15/2020   AKI (acute kidney injury) ([place]) 06/15/2020   CKD (chronic kidney disease), stage III ([place]) 06/15/2020   Anemia due to chronic blood loss    Gastric AVM    Angiodysplasia of duodenum with hemorrhage    Acute on chronic systolic (congestive) heart failure ([place]) 04/05/2020   Anemia 04/05/2020   PAF (paroxysmal atrial fibrillation) ([place]) 04/05/2020   OSA on CPAP 04/05/2020   Syncope and collapse 04/05/2020   Type 2 diabetes mellitus with hyperglycemia, with long-term current use of insulin ([place])  12/03/2019   Diabetic polyneuropathy associated with type 2 diabetes mellitus ([place]) 12/03/2019   Erectile dysfunction 12/03/2019   Paranoid schizophrenia, chronic condition ([place]) 01/26/2015   Severe recurrent major depressive disorder with psychotic features ([place]) 01/26/2015   GAD (generalized anxiety disorder) 01/26/2015   OCD (obsessive compulsive disorder) 01/26/2015   Panic disorder without agoraphobia 01/26/2015   Insomnia 01/26/2015    Past Surgical History:  Procedure Laterality Date   BIOPSY  04/19/2021   Procedure: BIOPSY;  Surgeon: Doran Stabler, MD;  Location: [place];  Service: Gastroenterology;;   COLONOSCOPY WITH PROPOFOL N/A 04/16/2021   Procedure: COLONOSCOPY WITH PROPOFOL;  Surgeon: [name], MD;  Location: [place];  Service: Gastroenterology;  Laterality: N/A;   CORONARY STENT PLACEMENT     ENTEROSCOPY N/A 04/08/2020   Procedure: ENTEROSCOPY;  Surgeon: Doran Stabler, MD;  Location: [place];  Service: Gastroenterology;  Laterality: N/A;   ENTEROSCOPY N/A 06/17/2020   Procedure: ENTEROSCOPY;  Surgeon: Irene Shipper, MD;  Location: [facility] ENDOSCOPY;  Service: Endoscopy;  Laterality: N/A;   ENTEROSCOPY N/A 04/15/2021   Procedure: ENTEROSCOPY;  Surgeon: [name], MD;  Location: [place];  Service: Gastroenterology;  Laterality: N/A;   ENTEROSCOPY N/A 04/19/2021   Procedure: ENTEROSCOPY;  Surgeon: Doran Stabler, MD;  Location: [place];  Service: Gastroenterology;  Laterality: N/A;   ENTEROSCOPY N/A 07/01/2021   Procedure: ENTEROSCOPY;  Surgeon: Jerene Bears, MD;  Location: [place] ENDOSCOPY;  Service: Gastroenterology;  Laterality: N/A;   GIVENS CAPSULE STUDY  04/16/2021   Procedure: GIVENS CAPSULE STUDY;  Surgeon: [name], MD;  Location: [place];  Service: Gastroenterology;;   HEMOSTASIS CLIP PLACEMENT  04/08/2020   Procedure: HEMOSTASIS CLIP PLACEMENT;  Surgeon: Doran Stabler, MD;  Location: [place];   Service: Gastroenterology;;   HEMOSTASIS CLIP PLACEMENT  07/01/2021   Procedure: HEMOSTASIS CLIP PLACEMENT;  Surgeon: Jerene Bears, MD;  Location: [place];  Service: Gastroenterology;;   HEMOSTASIS CONTROL  04/08/2020   Procedure: HEMOSTASIS CONTROL;  Surgeon: Doran Stabler, MD;  Location: [facility] ENDOSCOPY;  Service: Gastroenterology;;   HEMOSTASIS CONTROL  06/17/2020   Procedure: HEMOSTASIS CONTROL;  Surgeon: Irene Shipper, MD;  Location: [place];  Service: Endoscopy;;   HERNIA REPAIR     HOT HEMOSTASIS N/A 04/15/2021   Procedure: HOT HEMOSTASIS (ARGON PLASMA COAGULATION/BICAP);  Surgeon: [name], MD;  Location: [place];  Service: Gastroenterology;  Laterality: N/A;   HOT HEMOSTASIS N/A 07/01/2021   Procedure: HOT HEMOSTASIS (ARGON PLASMA COAGULATION/BICAP);  Surgeon: Jerene Bears, MD;  Location: [facility] ENDOSCOPY;  Service: Gastroenterology;  Laterality: N/A;   ICD IMPLANT N/A 09/03/2020   Procedure: ICD IMPLANT;  Surgeon: [name], MD;  Location: [place] CV LAB;  Service: Cardiovascular;  Laterality: N/A;   LEFT ATRIAL APPENDAGE OCCLUSION N/A 10/27/2021   Procedure: LEFT ATRIAL APPENDAGE OCCLUSION;  Surgeon: [name], MD;  Location: [place] CV LAB;  Service: Cardiovascular;  Laterality: N/A;   POLYPECTOMY  04/16/2021   Procedure: POLYPECTOMY;  Surgeon: [name], MD;  Location: [facility] ENDOSCOPY;  Service: Gastroenterology;;   RIGHT/LEFT HEART CATH AND CORONARY ANGIOGRAPHY N/A 10/14/2019   Procedure: RIGHT/LEFT HEART CATH AND CORONARY ANGIOGRAPHY;  Surgeon: Belva Crome, MD;  Location: [place] CV LAB;  Service: Cardiovascular;  Laterality: N/A;   SUBMUCOSAL TATTOO INJECTION  04/19/2021   Procedure: SUBMUCOSAL TATTOO INJECTION;  Surgeon: Doran Stabler, MD;  Location: [place];  Service: Gastroenterology;;   TEE WITHOUT CARDIOVERSION N/A 10/27/2021   Procedure: TRANSESOPHAGEAL ECHOCARDIOGRAM (TEE);  Surgeon: [name], MD;   Location: [place] CV LAB;  Service: Cardiovascular;  Laterality: N/A;   TEE WITHOUT CARDIOVERSION N/A 12/12/2021   Procedure: TRANSESOPHAGEAL ECHOCARDIOGRAM (TEE);  Surgeon: Geralynn Rile, MD;  Location: [place];  Service: Cardiovascular;  Laterality: N/A;    Current Outpatient Medications on File Prior to Visit  Medication Sig Dispense Refill   acetaminophen (TYLENOL) 500 MG tablet Take 2 tablets (1,000 mg total) by mouth 2 (two) times daily as needed. 180 tablet 3   albuterol (VENTOLIN HFA) 108 (90 Base) MCG/ACT inhaler Inhale 2 puffs into the lungs every 6 (six) hours as needed for wheezing or shortness of breath. (Patient not taking: Reported on 05/05/2022) 18 g 1   aspirin EC 81 MG tablet Take 1 tablet (81 mg total) by mouth daily. Swallow whole. 90 tablet 3   atorvastatin (LIPITOR) 40 MG tablet Take 1 tablet (40 mg total) by mouth daily at 6 PM. 90 tablet 1   blood glucose meter kit and supplies KIT Dispense based on patient and insurance preference. Use up to four times daily as directed. (FOR ICD-9 250.00, 250.01). 1 each 0   carvedilol (COREG) 6.25 MG tablet Take 1 tablet (6.25 mg total) by mouth 2 (two) times daily with a meal. 60 tablet 3   Continuous Blood Gluc Receiver (FREESTYLE LIBRE 14 DAY READER) DEVI 1 each by Does not apply  route as needed. 1 each 11   Continuous Blood Gluc Sensor (FREESTYLE LIBRE 14 DAY SENSOR) MISC Use one applicator every 14 (fourteen) days. 6 each 3   cyclobenzaprine (FLEXERIL) 10 MG tablet Take 10 mg by mouth 3 (three) times daily as needed for muscle spasms.     diclofenac Sodium (VOLTAREN) 1 % GEL as needed for pain.     empagliflozin (JARDIANCE) 10 MG TABS tablet Take 1 tablet (10 mg total) by mouth daily before breakfast. 30 tablet 6   ferrous sulfate 325 (65 FE) MG tablet Take 1 tablet (325 mg total) by mouth every other day. 30 tablet 1   furosemide (LASIX) 40 MG tablet Take 1.5 tablets (60 mg total) by mouth 2 (two) times daily. 90  tablet 3   gabapentin (NEURONTIN) 800 MG tablet Take 1 tablet (800 mg total) by mouth every 6 (six) hours. 120 tablet 5   glipiZIDE (GLUCOTROL) 10 MG tablet Take 1 tablet (10 mg total) by mouth 2 (two) times daily before a meal. 180 tablet 1   insulin glargine (LANTUS) 100 UNIT/ML injection Inject 0.2 mLs (20 Units total) into the skin daily. 20 mL 1   Insulin Lispro Prot & Lispro (HUMALOG MIX 75/25 KWIKPEN) (75-25) 100 UNIT/ML Kwikpen Inject 30 Units into the skin 2 (two) times daily. 54 mL 1   Insulin Syringe-Needle U-100 (TRUEPLUS INSULIN SYRINGE) 31G X 5/16" 0.3 ML MISC USE 3 TIMES DAILY TO INJECT INSULIN 300 each 3   metFORMIN (GLUCOPHAGE) 500 MG tablet Take 1 tablet (500 mg total) by mouth 2 (two) times daily with a meal. 180 tablet 1   Multiple Vitamins-Minerals (CENTRUM SILVER 50+MEN) TABS Take 1 tablet by mouth daily.     olopatadine (PATADAY) 0.1 % ophthalmic solution Place 1 drop into both eyes 2 (two) times daily. 5 mL 12   Oxycodone HCl 10 MG TABS Take 10 mg by mouth 3 (three) times daily.     pantoprazole (PROTONIX) 40 MG tablet Take 1 tablet (40 mg total) by mouth daily. 90 tablet 1   potassium chloride SA (KLOR-CON M) 20 MEQ tablet TAKE 2 TABLETS BY MOUTH ONCE DAILY 60 tablet 3   QUEtiapine (SEROQUEL) 300 MG tablet Take 1.5 tablets (450 mg total) by mouth at bedtime. 135 tablet 1   sacubitril-valsartan (ENTRESTO) 24-26 MG Take 1 tablet by mouth 2 (two) times daily. 180 tablet 3   Semaglutide, 2 MG/DOSE, (OZEMPIC, 2 MG/DOSE,) 8 MG/3ML SOPN Inject 2 mg into the skin once a week.     spironolactone (ALDACTONE) 25 MG tablet Take 0.5 tablets (12.5 mg total) by mouth at bedtime. 45 tablet 3   tadalafil (CIALIS) 20 MG tablet Take 20 mg by mouth daily as needed for erectile dysfunction.     TRUEPLUS PEN NEEDLES 31G X 6 MM MISC USE AS DIRECTED 100 each 0   No current facility-administered medications on file prior to visit.     No Known Allergies  Social History   Occupational  History   Not on file  Tobacco Use   Smoking status: Former   Smokeless tobacco: Never  Scientific laboratory technician Use: Never used  Substance and Sexual Activity   Alcohol use: Not Currently    Alcohol/week: 2.0 standard drinks of alcohol    Types: 2 Cans of beer per week   Drug use: No   Sexual activity: Yes    Birth control/protection: None    Family History  Problem Relation Age of Onset  Heart failure Mother    Mental illness Sister    Mental illness Sister     Immunization History  Administered Date(s) Administered   Influenza,inj,Quad PF,6+ Mos 10/13/2019, 09/13/2020, 08/03/2021   PFIZER(Purple Top)SARS-COV-2 Vaccination 03/13/2020   Pneumococcal Polysaccharide-23 10/16/2019   Tdap 09/13/2020   Zoster Recombinat (Shingrix) 08/03/2021    Review of systems: Positive Findings in bold print.  Constitutional:  chills, fatigue, fever, sweats, weight change Communication: Optometrist, sign Ecologist, hand writing, iPad/Android device Head: headaches, head injury Eyes: changes in vision, eye pain, glaucoma, cataracts, macular degeneration, diplopia, glare,  light sensitivity, eyeglasses or contacts, blindness Ears nose mouth throat: hearing impaired, hearing aids,  ringing in ears, deaf, sign language,  vertigo, nosebleeds,  rhinitis,  cold sores, snoring, swollen glands Cardiovascular: HTN, edema, arrhythmia, pacemaker in place, defibrillator in place, chest pain/tightness, chronic anticoagulation, blood clot, heart failure, MI Peripheral Vascular: leg cramps, varicose veins, blood clots, lymphedema, varicosities Respiratory:  asthma, difficulty breathing, denies congestion, SOB, wheezing, cough, emphysema Gastrointestinal: change in appetite or weight, abdominal pain, constipation, diarrhea, nausea, vomiting, vomiting blood, change in bowel habits, abdominal pain, jaundice, rectal bleeding, hemorrhoids, GERD Genitourinary:  nocturia,  pain on urination, polyuria,  blood  in urine, Foley catheter, urinary urgency, ESRD on hemodialysis Musculoskeletal: amputation, cramping, stiff joints, painful joints, decreased joint motion, fractures, OA, gout, hemiplegia, paraplegia, uses cane, wheelchair bound, uses walker, uses rollator Skin: +changes in toenails, color change, dryness, itching, mole changes,  rash, wound(s) Neurological: headaches, numbness in feet, paresthesias in feet, burning in feet, fainting,  seizures, change in speech, migraines, memory problems/poor historian, cerebral palsy, weakness, paralysis, CVA, TIA Endocrine: diabetes, hypothyroidism, hyperthyroidism,  goiter, dry mouth, flushing, heat intolerance, cold intolerance,  excessive thirst, denies polyuria,  nocturia Hematological:  easy bleeding, excessive bleeding, easy bruising, enlarged lymph nodes, on long term blood thinner, history of past transusions Allergy/immunological:  hives, eczema, frequent infections, multiple drug allergies, seasonal allergies, transplant recipient, multiple food allergies Psychiatric:  anxiety, depression, mood disorder, suicidal ideations, hallucinations, insomnia  Objective: There were no vitals filed for this visit. Vascular Examination: Capillary refill time less than 3 seconds x 10 digits.  Dorsalis pedis pulses palpable 2 out of 4.  Posterior tibial pulses palpable 2 out of 4.  Digital hair not present x 10 digits.  Skin temperature gradient WNL b/l.  Dermatological Examination: Skin with normal turgor, texture and tone b/l  Toenails 1-5 b/l discolored, thick, dystrophic with subungual debris and pain with palpation to nailbeds due to thickness of nails.  Musculoskeletal: Muscle strength 5/5 to all LE muscle groups.  Neurological: Sensation intact with 10 gram monofilament.  Vibratory sensation intact.  Assessment: NIDDM Encounter for diabetic foot examination  Plan: Discussed diabetic foot care principles. Literature dispensed on  today. Patient to continue soft, supportive shoe gear daily. Patient to report any pedal injuries to medical professional immediately. Follow up one year. Patient/POA to call should there be a concern in the interim.

## 2022-05-11 ENCOUNTER — Ambulatory Visit (HOSPITAL_BASED_OUTPATIENT_CLINIC_OR_DEPARTMENT_OTHER): Payer: Medicare Other | Attending: Nurse Practitioner | Admitting: Internal Medicine

## 2022-05-11 ENCOUNTER — Telehealth (HOSPITAL_COMMUNITY): Payer: Self-pay

## 2022-05-11 DIAGNOSIS — Z9989 Dependence on other enabling machines and devices: Secondary | ICD-10-CM | POA: Insufficient documentation

## 2022-05-11 DIAGNOSIS — I493 Ventricular premature depolarization: Secondary | ICD-10-CM | POA: Diagnosis not present

## 2022-05-11 DIAGNOSIS — G4733 Obstructive sleep apnea (adult) (pediatric): Secondary | ICD-10-CM

## 2022-05-11 NOTE — Telephone Encounter (Signed)
Have tried to reach Mr. Murley several times over the last two days with no success to set up home paramedicine visit.    -he reached out today 6/15 at 68 and reported he was home and okay for me to come out however I was with another patient. I advised [name] we would need to schedule something for next week. He agreed with plan. I also reminded him of sleep study scheduled for today at 8:00pm. He agreed. Call complete.   [name], [place] 05/11/2022

## 2022-05-14 DIAGNOSIS — Z9989 Dependence on other enabling machines and devices: Secondary | ICD-10-CM | POA: Diagnosis not present

## 2022-05-14 DIAGNOSIS — G4733 Obstructive sleep apnea (adult) (pediatric): Secondary | ICD-10-CM

## 2022-05-14 NOTE — Procedures (Signed)
     Patient Name: [name], [name] Date: 05/11/2022 Gender: Male D.O.B: August 06, 1961 Age (years): 43 Referring Provider: Dionisio David NP Height (inches): 74 Interpreting Physician: Baird Lyons MD, ABSM Weight (lbs): 315 RPSGT: [name] BMI: 40 MRN: [id number] Neck Size: 19.00  CLINICAL INFORMATION The patient is referred for a Split Night titration study.  SLEEP STUDY TECHNIQUE As per the AASM Manual for the Scoring of Sleep and Associated Events v2.3 (April 2016) with a hypopnea requiring 4% desaturations.  The channels recorded and monitored were frontal, central and occipital EEG, electrooculogram (EOG), submentalis EMG (chin), nasal and oral airflow, thoracic and abdominal wall motion, anterior tibialis EMG, snore microphone, electrocardiogram, and pulse oximetry. Bilevel positive airway pressure (BPAP) was initiated at the beginning of the study and titrated to treat sleep-disordered breathing.  MEDICATIONS Medications self-administered by patient taken the night of the study : GABAPENTIN, SEROQUEL, TYLENOL  RESPIRATORY PARAMETERS Optimal IPAP Pressure (cm): 19 AHI at Optimal Pressure (/hr) 0 Optimal EPAP Pressure (cm): 15   Overall Minimal O2 (%): 51.0 Minimal O2 at Optimal Pressure (%): 91.0 SLEEP ARCHITECTURE Start Time: 9:40:09 PM Stop Time: 4:44:21 AM Total Time (min): 424.2 Total Sleep Time (min): 420.5 Sleep Latency (min): 0.4 Sleep Efficiency (%): 99.1% REM Latency (min): 57.0 WASO (min): 3.3 Stage N1 (%): 0.8% Stage N2 (%): 70.6% Stage N3 (%): 0.0% Stage R (%): 28.5 Supine (%): 52.83 Arousal Index (/hr): 11.4   CARDIAC DATA The 2 lead EKG demonstrated sinus rhythm, pacemaker generated. The mean heart rate was 80.8 beats per minute. Other EKG findings include: PVCs.  LEG MOVEMENT DATA The total Periodic Limb Movements of Sleep (PLMS) were 0. The PLMS index was 0.0. A PLMS index of <15 is considered normal in adults.  IMPRESSIONS -  Severe  obstructive sleep apnea/ hypopnea syndrome, AHI 70.4/ hr. -  CPAP did not provide adequate control at comfortable pressure and was changed to BIPAP titration. - An optimal BIPAP pressure was selected for this patient ( 19 /15 cm of water) - Central sleep apnea was not noted during this titration (CAI = 1.6/h). - Severe oxygen desaturations were observed during this titration (min O2 = 51.0%). Minimum O2 saturation on BIPAP 19/15 was 91%. - The patient snored with moderate snoring volume. - 2-lead EKG demonstrated: PVCs - Clinically significant periodic limb movements were not noted during this study. Arousals associated with PLMs were rare.  DIAGNOSIS - Obstructive Sleep Apnea (G47.33)  RECOMMENDATIONS - Trial of BiPAP therapy on 19/15 cm H2O. - Patient used a Large size Fisher&Paykel Full Face Evora Full mask and heated humidification. - Be careful with alcohol, sedatives and other CNS depressants that may worsen sleep apnea and disrupt normal sleep architecture. - Sleep hygiene should be reviewed to assess factors that may improve sleep quality. - Weight management and regular exercise should be initiated or continued.  [Electronically signed] 05/14/2022 01:48 PM  Baird Lyons MD, [place], American Board of Sleep Medicine NPI: [id number]                        [place], [place] [facility]  ELECTRONICALLY SIGNED ON:  05/14/2022, 1:37 PM [place] PH: [phone]   FX: [phone] [place]

## 2022-05-16 ENCOUNTER — Other Ambulatory Visit (HOSPITAL_COMMUNITY): Payer: Self-pay | Admitting: Family Medicine

## 2022-05-16 ENCOUNTER — Other Ambulatory Visit (HOSPITAL_COMMUNITY): Payer: Self-pay

## 2022-05-16 NOTE — Progress Notes (Signed)
Paramedicine Encounter    Patient ID: [name], male    DOB: November 11, 1961, 61 y.o.   MRN: [id number]   Arrived for home visit from [facility] who reports feeling good with no complaints on assessment. He denied shortness of breath, dizziness, chest pain or swelling. He missed our home visit last week so he has been missing several medications. He reports he has been taking them out of the bottles but not taking them like he supposed to. I obtained vitals:  WT- 320lbs BP- 150/90 HR- 77 O2- 95% CBG- 320  I reviewed medications and confirmed same, filling pill box for one week and reviewing how to take using pill box. He demonstrated understanding.   He has also not gotten updated dose of Ozempic yet, I will check with Meagan Supple about same.   We reviewed sleep study notes, I will follow up on BIPAP order and equipment.   We reviewed upcoming appointments. I plan to come out next week for home visit. He has several meds he is needing refilled. I called in same to [facility]:  -jardiance -carvedilol -spironolactone -lasix -atorvastatin -lantus -gabapentin -potassium   Home visit complete.   [name], [place] 05/16/2022     Patient Care Team: Teena Dunk, NP as PCP - General (Nurse Practitioner) [name], MD as PCP - Cardiology (Cardiology) [name], MD as PCP - Advanced Heart Failure (Cardiology) [name], MD as PCP - Electrophysiology (Cardiology) [name], LCSW as Social Worker (Licensed Clinical Social Worker) [name], MD as Consulting Physician (Gastroenterology)  Patient Active Problem List   Diagnosis Date Noted   Presence of Watchman left atrial appendage closure device 10/28/2021   Atrial fibrillation ([place]) 10/27/2021   Melena    Chronic diastolic CHF (congestive heart failure) ([place])    GI bleed 06/29/2021   Severe anemia 06/29/2021   Adenomatous polyp of ascending colon    ICD  (implantable cardioverter-defibrillator) in place 12/08/2020   Acute blood loss anemia    Angiodysplasia of stomach    Chronic anticoagulation    CHF exacerbation ([place]) 06/15/2020   AKI (acute kidney injury) ([place]) 06/15/2020   CKD (chronic kidney disease), stage III ([place]) 06/15/2020   Anemia due to chronic blood loss    Gastric AVM    Angiodysplasia of duodenum with hemorrhage    Acute on chronic systolic (congestive) heart failure ([place]) 04/05/2020   Anemia 04/05/2020   PAF (paroxysmal atrial fibrillation) ([place]) 04/05/2020   OSA on CPAP 04/05/2020   Syncope and collapse 04/05/2020   Type 2 diabetes mellitus with hyperglycemia, with long-term current use of insulin ([place]) 12/03/2019   Diabetic polyneuropathy associated with type 2 diabetes mellitus ([place]) 12/03/2019   Erectile dysfunction 12/03/2019   Paranoid schizophrenia, chronic condition ([place]) 01/26/2015   Severe recurrent major depressive disorder with psychotic features ([place]) 01/26/2015   GAD (generalized anxiety disorder) 01/26/2015   OCD (obsessive compulsive disorder) 01/26/2015   Panic disorder without agoraphobia 01/26/2015   Insomnia 01/26/2015    Current Outpatient Medications:    acetaminophen (TYLENOL) 500 MG tablet, Take 2 tablets (1,000 mg total) by mouth 2 (two) times daily as needed., Disp: 180 tablet, Rfl: 3   albuterol (VENTOLIN HFA) 108 (90 Base) MCG/ACT inhaler, Inhale 2 puffs into the lungs every 6 (six) hours as needed for wheezing or shortness of breath. (Patient not taking: Reported on 05/05/2022), Disp: 18 g, Rfl: 1   aspirin EC 81 MG tablet, Take 1 tablet (81 mg  total) by mouth daily. Swallow whole., Disp: 90 tablet, Rfl: 3   atorvastatin (LIPITOR) 40 MG tablet, Take 1 tablet (40 mg total) by mouth daily at 6 PM., Disp: 90 tablet, Rfl: 1   blood glucose meter kit and supplies KIT, Dispense based on patient and insurance preference. Use up to four times daily as directed. (FOR ICD-9 250.00, 250.01)., Disp: 1  each, Rfl: 0   carvedilol (COREG) 6.25 MG tablet, Take 1 tablet (6.25 mg total) by mouth 2 (two) times daily with a meal., Disp: 60 tablet, Rfl: 3   clotrimazole-betamethasone (LOTRISONE) cream, Apply 1 application  topically 2 (two) times daily., Disp: 30 g, Rfl: 0   Continuous Blood Gluc Receiver (FREESTYLE LIBRE [place]) DEVI, 1 each by Does not apply route as needed., Disp: 1 each, Rfl: 11   Continuous Blood Gluc Sensor (FREESTYLE LIBRE 14 DAY SENSOR) MISC, Use one applicator every 14 (fourteen) days., Disp: 6 each, Rfl: 3   cyclobenzaprine (FLEXERIL) 10 MG tablet, Take 10 mg by mouth 3 (three) times daily as needed for muscle spasms., Disp: , Rfl:    diclofenac Sodium (VOLTAREN) 1 % GEL, as needed for pain., Disp: , Rfl:    empagliflozin (JARDIANCE) 10 MG TABS tablet, Take 1 tablet (10 mg total) by mouth daily before breakfast., Disp: 30 tablet, Rfl: 6   ferrous sulfate 325 (65 FE) MG tablet, Take 1 tablet (325 mg total) by mouth every other day., Disp: 30 tablet, Rfl: 1   furosemide (LASIX) 40 MG tablet, Take 1.5 tablets (60 mg total) by mouth 2 (two) times daily., Disp: 90 tablet, Rfl: 3   gabapentin (NEURONTIN) 800 MG tablet, Take 1 tablet (800 mg total) by mouth every 6 (six) hours., Disp: 120 tablet, Rfl: 5   glipiZIDE (GLUCOTROL) 10 MG tablet, Take 1 tablet (10 mg total) by mouth 2 (two) times daily before a meal., Disp: 180 tablet, Rfl: 1   insulin glargine (LANTUS) 100 UNIT/ML injection, Inject 0.2 mLs (20 Units total) into the skin daily., Disp: 20 mL, Rfl: 1   Insulin Lispro Prot & Lispro (HUMALOG MIX 75/25 KWIKPEN) (75-25) 100 UNIT/ML Kwikpen, Inject 30 Units into the skin 2 (two) times daily., Disp: 54 mL, Rfl: 1   Insulin Syringe-Needle U-100 (TRUEPLUS INSULIN SYRINGE) 31G X 5/16" 0.3 ML MISC, USE 3 TIMES DAILY TO INJECT INSULIN, Disp: 300 each, Rfl: 3   metFORMIN (GLUCOPHAGE) 500 MG tablet, Take 1 tablet (500 mg total) by mouth 2 (two) times daily with a meal., Disp: 180  tablet, Rfl: 1   Multiple Vitamins-Minerals (CENTRUM SILVER 50+MEN) TABS, Take 1 tablet by mouth daily., Disp: , Rfl:    olopatadine (PATADAY) 0.1 % ophthalmic solution, Place 1 drop into both eyes 2 (two) times daily., Disp: 5 mL, Rfl: 12   Oxycodone HCl 10 MG TABS, Take 10 mg by mouth 3 (three) times daily., Disp: , Rfl:    pantoprazole (PROTONIX) 40 MG tablet, Take 1 tablet (40 mg total) by mouth daily., Disp: 90 tablet, Rfl: 1   potassium chloride SA (KLOR-CON M) 20 MEQ tablet, TAKE 2 TABLETS BY MOUTH ONCE DAILY, Disp: 60 tablet, Rfl: 3   QUEtiapine (SEROQUEL) 300 MG tablet, Take 1.5 tablets (450 mg total) by mouth at bedtime., Disp: 135 tablet, Rfl: 1   sacubitril-valsartan (ENTRESTO) 24-26 MG, Take 1 tablet by mouth 2 (two) times daily., Disp: 180 tablet, Rfl: 3   Semaglutide, 2 MG/DOSE, (OZEMPIC, 2 MG/DOSE,) 8 MG/3ML SOPN, Inject 2 mg into the skin  once a week., Disp: , Rfl:    spironolactone (ALDACTONE) 25 MG tablet, Take 0.5 tablets (12.5 mg total) by mouth at bedtime., Disp: 45 tablet, Rfl: 3   tadalafil (CIALIS) 20 MG tablet, Take 20 mg by mouth daily as needed for erectile dysfunction., Disp: , Rfl:    TRUEPLUS PEN NEEDLES 31G X 6 MM MISC, USE AS DIRECTED, Disp: 100 each, Rfl: 0 No Known Allergies   Social History   Socioeconomic History   Marital status: Legally Separated    Spouse name: Not on file   Number of children: Not on file   Years of education: Not on file   Highest education level: Not on file  Occupational History   Not on file  Tobacco Use   Smoking status: Former   Smokeless tobacco: Never  Vaping Use   Vaping Use: Never used  Substance and Sexual Activity   Alcohol use: Not Currently    Alcohol/week: 2.0 standard drinks of alcohol    Types: 2 Cans of beer per week   Drug use: No   Sexual activity: Yes    Birth control/protection: None  Other Topics Concern   Not on file  Social History Narrative   Not on file   Social Determinants of Health    Financial Resource Strain: Low Risk  (12/23/2019)   Overall Financial Resource Strain (CARDIA)    Difficulty of Paying Living Expenses: Not very hard  Food Insecurity: No Food Insecurity (07/01/2021)   Hunger Vital Sign    Worried About Running Out of Food in the Last Year: Never true    Ran Out of Food in the Last Year: Never true  Transportation Needs: Unmet Transportation Needs (07/01/2021)   PRAPARE - Hydrologist (Medical): Yes    Lack of Transportation (Non-Medical): Yes  Physical Activity: Not on file  Stress: Not on file  Social Connections: Not on file  Intimate Partner Violence: Not on file    Physical Exam      Future Appointments  Date Time Provider [place]  06/02/2022  7:40 AM [facility] DEVICE REMOTES [facility]  06/06/2022  2:20 PM [name], MD [facility] None  06/07/2022  2:15 PM Felipa Furnace, DPM [facility]  08/07/2022 10:20 AM Fenton Foy, NP [facility] None  09/01/2022  7:40 AM [facility] DEVICE REMOTES [facility]  12/01/2022  7:40 AM [facility] DEVICE REMOTES [facility]  03/02/2023  7:40 AM [facility] DEVICE REMOTES [facility]  06/01/2023  7:40 AM [facility] DEVICE REMOTES [facility]     ACTION: Home visit completed

## 2022-05-17 ENCOUNTER — Telehealth (HOSPITAL_COMMUNITY): Payer: Self-pay | Admitting: Licensed Clinical Social Worker

## 2022-05-17 NOTE — Telephone Encounter (Addendum)
HF Paramedicine Team Based Care Meeting  HF MD- NA  HF NP - [place] NP-C   [facility] admit within the last 30 days for heart failure? no  Medications concerns? Still struggling with understanding medications he is taking and how to take them  SDOH - wanting to get into new housing- housing list given to paramedic to provide to pt.  Eligible for discharge? Not at this time  [name], [place] Worker [facility] Desk#: [phone] Cell#: [phone]

## 2022-05-23 ENCOUNTER — Other Ambulatory Visit (HOSPITAL_COMMUNITY): Payer: Self-pay

## 2022-06-02 ENCOUNTER — Ambulatory Visit (INDEPENDENT_AMBULATORY_CARE_PROVIDER_SITE_OTHER): Payer: Medicare Other

## 2022-06-02 DIAGNOSIS — I5032 Chronic diastolic (congestive) heart failure: Secondary | ICD-10-CM | POA: Diagnosis not present

## 2022-06-03 LAB — CUP PACEART REMOTE DEVICE CHECK
Battery Remaining Percentage: 100 %
Battery Voltage: 3.1 V
Brady Statistic AS VP Percent: 0 %
Brady Statistic AS VS Percent: 98 %
Brady Statistic RV Percent Paced: 0 %
Date Time Interrogation Session: 20230706031500
HighPow Impedance: 75 Ohm
Implantable Lead Implant Date: 20211008
Implantable Lead Location: 753860
Implantable Lead Model: 436910
Implantable Lead Serial Number: 81404997
Implantable Pulse Generator Implant Date: 20211008
Lead Channel Impedance Value: 442 Ohm
Lead Channel Pacing Threshold Amplitude: 0.4 V
Lead Channel Pacing Threshold Pulse Width: 0.4 ms
Lead Channel Sensing Intrinsic Amplitude: 1.1 mV
Lead Channel Sensing Intrinsic Amplitude: 16.6 mV
Lead Channel Setting Pacing Amplitude: 2 V
Lead Channel Setting Pacing Pulse Width: 0.4 ms
Lead Channel Setting Sensing Sensitivity: 0.8 mV
Pulse Gen Model: 429525
Pulse Gen Serial Number: 84810752

## 2022-06-06 ENCOUNTER — Encounter (HOSPITAL_COMMUNITY): Payer: Medicare Other | Admitting: Cardiology

## 2022-06-06 ENCOUNTER — Other Ambulatory Visit (HOSPITAL_COMMUNITY): Payer: Self-pay | Admitting: Family Medicine

## 2022-06-06 ENCOUNTER — Other Ambulatory Visit (HOSPITAL_COMMUNITY): Payer: Self-pay

## 2022-06-06 NOTE — Progress Notes (Signed)
Paramedicine Encounter    Patient ID: [name], male    DOB: [birth year], 61 y.o.   MRN: [id number]  Arrived for [facility] visit [name], he reports he rolled his right Long while walking down the stairs at his home 2 days ago. He denied falling but there is bruising noted to the right Long with swelling. Pulses present and he is able to flex and extend but complains of pain upon doing so, he reports he is unable to bear weight on the right foot. He has an appointment with [name] tomorrow. Long assisted him in elevating his foot and applying some ICE to same. He does have some swelling to the left Long as well but he reports he does not recall injuring same. Pulses present and he is able to flex and extend same and reports he can bear weight on same but it is painful. He denied any other traumatic injuries or falls.   He denied chest pain, dizziness, shortness of breath or trouble taking meds. Pill boxes are empty minus a few bedtime doses.   Lungs clear, no JVD, no lower leg swelling aside from Long injuries.    Vitals obtained-  WT- 320lbs (three days ago)  BP- 130/70 HR- 70 O2- 93% RR- 16 CBG- 128  Long reviewed upcoming appointments with [name] and wrote down same.   Long reviewed all meds and confirmed same. Long filled pill box for two weeks. No missing meds noted.   Long plan to see [name] in two weeks. He knows to reach out in the mean time if needed. Home visit complete.   Refills:(called into [name]) Carvedilol  Potassium  (Both need new RX's sent)   [name], [name] [phone] 06/06/2022     Patient Care Team: Joel Merino I, NP as PCP - General (Nurse Practitioner) Joel Margarita, MD as PCP - Cardiology (Cardiology) Joel Dresser, MD as PCP - Advanced Heart Failure (Cardiology) Joel Epley, MD as PCP - Electrophysiology (Cardiology) Joel Ny, LCSW as Social Worker (Licensed Clinical Social Worker) Joel Park, MD  as Consulting Physician (Gastroenterology)  Patient Active Problem List   Diagnosis Date Noted   Presence of Watchman left atrial appendage closure device 10/28/2021   Atrial fibrillation ([name]) 10/27/2021   Melena    Chronic diastolic CHF (congestive heart failure) ([name])    GI bleed 06/29/2021   Severe anemia 06/29/2021   Adenomatous polyp of ascending colon    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   Acute blood loss anemia    Angiodysplasia of stomach    Chronic anticoagulation    CHF exacerbation ([name]) 06/15/2020   AKI (acute kidney injury) ([name]) 06/15/2020   CKD (chronic kidney disease), stage III ([name]) 06/15/2020   Anemia due to chronic blood loss    Gastric AVM    Angiodysplasia of duodenum with hemorrhage    Acute on chronic systolic (congestive) heart failure ([name]) 04/05/2020   Anemia 04/05/2020   PAF (paroxysmal atrial fibrillation) ([place]) 04/05/2020   OSA on CPAP 04/05/2020   Syncope and collapse 04/05/2020   Type 2 diabetes Long with hyperglycemia, with long-term current use of insulin ([name]) 12/03/2019   [name] ([name]) 12/03/2019   Erectile dysfunction 12/03/2019   Paranoid schizophrenia, chronic condition ([name]) 01/26/2015   Severe recurrent major depressive disorder with psychotic features ([name]) 01/26/2015   GAD (generalized anxiety disorder) 01/26/2015   OCD (obsessive compulsive disorder) 01/26/2015   Panic  disorder without agoraphobia 01/26/2015   Insomnia 01/26/2015    Current Outpatient Medications:    acetaminophen (TYLENOL) 500 MG tablet, Take 2 tablets (1,000 mg total) by mouth 2 (two) times daily as needed., Disp: 180 tablet, Rfl: 3   albuterol (VENTOLIN HFA) 108 (90 Base) MCG/ACT inhaler, Inhale 2 puffs into the lungs every 6 (six) hours as needed for wheezing or shortness of breath., Disp: 18 g, Rfl: 1   aspirin EC 81 MG tablet, Take 1 tablet (81 mg total) by mouth  daily. Swallow whole., Disp: 90 tablet, Rfl: 3   atorvastatin (LIPITOR) 40 MG tablet, Take 1 tablet (40 mg total) by mouth daily at 6 PM., Disp: 90 tablet, Rfl: 1   blood glucose meter kit and supplies KIT, Dispense based on patient and insurance preference. Use up to four times daily as directed. (FOR ICD-9 250.00, 250.01)., Disp: 1 each, Rfl: 0   carvedilol (COREG) 6.25 MG tablet, TAKE 1 TABLET BY MOUTH TWICE DAILY WITH A MEAL, Disp: 60 tablet, Rfl: 0   clotrimazole-betamethasone (LOTRISONE) cream, Apply 1 application  topically 2 (two) times daily., Disp: 30 g, Rfl: 0   Continuous Blood Gluc Receiver (FREESTYLE LIBRE [place]) DEVI, 1 each by Does not apply route as needed., Disp: 1 each, Rfl: 11   Continuous Blood Gluc Sensor (FREESTYLE LIBRE 14 DAY SENSOR) MISC, Use one applicator every 14 (fourteen) days., Disp: 6 each, Rfl: 3   cyclobenzaprine (FLEXERIL) 10 MG tablet, Take 10 mg by mouth 3 (three) times daily as needed for muscle spasms., Disp: , Rfl:    diclofenac Sodium (VOLTAREN) 1 % GEL, as needed for pain., Disp: , Rfl:    empagliflozin (JARDIANCE) 10 MG TABS tablet, Take 1 tablet (10 mg total) by mouth daily before breakfast., Disp: 30 tablet, Rfl: 6   ferrous sulfate 325 (65 FE) MG tablet, Take 1 tablet (325 mg total) by mouth every other day. (Patient taking differently: Take 325 mg by mouth every other day.), Disp: 30 tablet, Rfl: 1   furosemide (LASIX) 40 MG tablet, TAKE 1 & 1/2 (ONE & ONE-HALF) TABLETS BY MOUTH TWICE DAILY, Disp: 90 tablet, Rfl: 0   gabapentin (NEURONTIN) 800 MG tablet, Take 1 tablet (800 mg total) by mouth every 6 (six) hours., Disp: 120 tablet, Rfl: 5   glipiZIDE (GLUCOTROL) 10 MG tablet, Take 1 tablet (10 mg total) by mouth 2 (two) times daily before a meal., Disp: 180 tablet, Rfl: 1   insulin glargine (LANTUS) 100 UNIT/ML injection, Inject 0.2 mLs (20 Units total) into the skin daily., Disp: 20 mL, Rfl: 1   Insulin Lispro Prot & Lispro (HUMALOG MIX 75/25  KWIKPEN) (75-25) 100 UNIT/ML Kwikpen, Inject 30 Units into the skin 2 (two) times daily., Disp: 54 mL, Rfl: 1   Insulin Syringe-Needle U-100 (TRUEPLUS INSULIN SYRINGE) 31G X 5/16" 0.3 ML MISC, USE 3 TIMES DAILY TO INJECT INSULIN, Disp: 300 each, Rfl: 3   metFORMIN (GLUCOPHAGE) 500 MG tablet, Take 1 tablet (500 mg total) by mouth 2 (two) times daily with a meal., Disp: 180 tablet, Rfl: 1   Multiple Vitamins-Minerals (CENTRUM SILVER 50+MEN) TABS, Take 1 tablet by mouth daily., Disp: , Rfl:    olopatadine (PATADAY) 0.1 % ophthalmic solution, Place 1 drop into both eyes 2 (two) times daily., Disp: 5 mL, Rfl: 12   Oxycodone HCl 10 MG TABS, Take 10 mg by mouth 3 (three) times daily., Disp: , Rfl:    pantoprazole (PROTONIX) 40 MG tablet, Take 1 tablet (  40 mg total) by mouth daily., Disp: 90 tablet, Rfl: 1   potassium chloride SA (KLOR-CON M) 20 MEQ tablet, Take 2 tablets by mouth once daily, Disp: 60 tablet, Rfl: 0   QUEtiapine (SEROQUEL) 300 MG tablet, Take 1.5 tablets (450 mg total) by mouth at bedtime., Disp: 135 tablet, Rfl: 1   sacubitril-valsartan (ENTRESTO) 24-26 MG, Take 1 tablet by mouth 2 (two) times daily., Disp: 180 tablet, Rfl: 3   Semaglutide, 2 MG/DOSE, (OZEMPIC, 2 MG/DOSE,) 8 MG/3ML SOPN, Inject 2 mg into the skin once a week., Disp: , Rfl:    spironolactone (ALDACTONE) 25 MG tablet, Take 0.5 tablets (12.5 mg total) by mouth at bedtime., Disp: 45 tablet, Rfl: 3   tadalafil (CIALIS) 20 MG tablet, Take 20 mg by mouth daily as needed for erectile dysfunction., Disp: , Rfl:    TRUEPLUS PEN NEEDLES 31G X 6 MM MISC, USE AS DIRECTED, Disp: 100 each, Rfl: 0 No Known Allergies   Social History   Socioeconomic History   Marital status: Legally Separated    Spouse name: Not on file   Number of children: Not on file   Years of education: Not on file   Highest education level: Not on file  Occupational History   Not on file  Tobacco Use   Smoking status: Former   Smokeless tobacco: Never   Vaping Use   Vaping Use: Never used  Substance and Sexual Activity   Alcohol use: Not Currently    Alcohol/week: 2.0 standard drinks of alcohol    Types: 2 Cans of beer per week   Drug use: No   Sexual activity: Yes    Birth control/protection: None  Other Topics Concern   Not on file  Social History Narrative   Not on file   Social Determinants of Health   Financial Resource Strain: Low Risk  (12/23/2019)   Overall Financial Resource Strain (CARDIA)    Difficulty of Paying Living Expenses: Not very hard  Food Insecurity: No Food Insecurity (07/01/2021)   Hunger Vital Sign    Worried About Running Out of Food in the Last Year: Never true    Ran Out of Food in the Last Year: Never true  Transportation Needs: Unmet Transportation Needs (07/01/2021)   PRAPARE - Hydrologist (Medical): Yes    Lack of Transportation (Non-Medical): Yes  Physical Activity: Not on file  Stress: Not on file  Social Connections: Not on file  Intimate Partner Violence: Not on file    Physical Exam      Future Appointments  Date Time Provider [place]  06/07/2022  2:15 PM Felipa Furnace, DPM [facility]  08/07/2022 10:20 AM Fenton Foy, NP [facility] None  09/01/2022  7:40 AM [facility] DEVICE REMOTES [facility]  12/01/2022  7:40 AM [facility] DEVICE REMOTES [facility]  03/02/2023  7:40 AM [facility] DEVICE REMOTES [facility]  06/01/2023  7:40 AM [facility] DEVICE REMOTES [facility]     ACTION: Home visit completed

## 2022-06-07 ENCOUNTER — Other Ambulatory Visit (HOSPITAL_COMMUNITY): Payer: Self-pay | Admitting: *Deleted

## 2022-06-07 ENCOUNTER — Ambulatory Visit (INDEPENDENT_AMBULATORY_CARE_PROVIDER_SITE_OTHER): Payer: Medicare Other | Admitting: Podiatry

## 2022-06-07 DIAGNOSIS — R601 Generalized edema: Secondary | ICD-10-CM | POA: Diagnosis not present

## 2022-06-07 DIAGNOSIS — I878 Other specified disorders of veins: Secondary | ICD-10-CM

## 2022-06-07 MED ORDER — CARVEDILOL 6.25 MG PO TABS: 6.2500 mg | ORAL_TABLET | Freq: Two times a day (BID) | ORAL | 6 refills | Status: DC

## 2022-06-07 MED ORDER — POTASSIUM CHLORIDE CRYS ER 20 MEQ PO TBCR: 40.0000 meq | EXTENDED_RELEASE_TABLET | Freq: Every day | ORAL | 6 refills | Status: DC

## 2022-06-07 MED ORDER — DOXYCYCLINE HYCLATE 100 MG PO TABS: 100.0000 mg | ORAL_TABLET | Freq: Two times a day (BID) | ORAL | 0 refills | Status: DC

## 2022-06-08 ENCOUNTER — Telehealth (HOSPITAL_COMMUNITY): Payer: Self-pay

## 2022-06-08 NOTE — Telephone Encounter (Signed)
Left a message with the nurse line at Patient [name] in attempt to assist [name] in obtaining his CPAP machine. He had a sleep study on 6/16 at [facility] and they advised the referring provider is typically who orders the CPAP machine for the patient after completion of the sleep study. I will continue to assist him in this process. Message left for a nurse to reach out to me for same.   [name], [name] 06/08/2022

## 2022-06-13 NOTE — Progress Notes (Signed)
Subjective:  Patient ID: [name], male    DOB: [birth year],  MRN: [id number]  Chief Complaint  Patient presents with   Diabetes    61 y.o. male presents with the above complaint.  Patient presents with generalized swelling to the right lower extremity there are some erythema associated.  He states that he is having some discomfort and swelling.  He wanted get it evaluated he is not taking antibiotics he has not seen and was prior to seeing me.  He is more swollen than before.  He denies any other acute complaints.  Started causing some pain while ambulating.  No calf pain no concern for DVT   Review of Systems: Negative except as noted in the HPI. Denies N/V/F/Ch.  Past Medical History:  Diagnosis Date   AICD (automatic cardioverter/defibrillator) present    Anxiety    Atrial fibrillation (HCC)    CAD (coronary artery disease)    Cardiomyopathy (HCC)    CHF (congestive heart failure) ([place]) 09/2019   Depression    Diabetes mellitus    Erectile dysfunction 11/2019   GI bleeding    H/O right heart catheterization 09/2019   Hypertension    Presence of permanent cardiac pacemaker    Presence of Watchman left atrial appendage closure device 10/27/2021   27 mm Watchman Flex Device per Dr. Quentin Ore   Schizophrenia [facility])    Sleep apnea    uses cpap    Current Outpatient Medications:    doxycycline (VIBRA-TABS) 100 MG tablet, Take 1 tablet (100 mg total) by mouth 2 (two) times daily., Disp: 28 tablet, Rfl: 0   acetaminophen (TYLENOL) 500 MG tablet, Take 2 tablets (1,000 mg total) by mouth 2 (two) times daily as needed., Disp: 180 tablet, Rfl: 3   albuterol (VENTOLIN HFA) 108 (90 Base) MCG/ACT inhaler, Inhale 2 puffs into the lungs every 6 (six) hours as needed for wheezing or shortness of breath., Disp: 18 g, Rfl: 1   aspirin EC 81 MG tablet, Take 1 tablet (81 mg total) by mouth daily. Swallow whole., Disp: 90 tablet, Rfl: 3   atorvastatin (LIPITOR) 40 MG tablet, Take 1  tablet (40 mg total) by mouth daily at 6 PM., Disp: 90 tablet, Rfl: 1   blood glucose meter kit and supplies KIT, Dispense based on patient and insurance preference. Use up to four times daily as directed. (FOR ICD-9 250.00, 250.01)., Disp: 1 each, Rfl: 0   carvedilol (COREG) 6.25 MG tablet, Take 1 tablet (6.25 mg total) by mouth 2 (two) times daily with a meal., Disp: 60 tablet, Rfl: 6   clotrimazole-betamethasone (LOTRISONE) cream, Apply 1 application  topically 2 (two) times daily., Disp: 30 g, Rfl: 0   Continuous Blood Gluc Receiver (FREESTYLE LIBRE [place]) DEVI, 1 each by Does not apply route as needed., Disp: 1 each, Rfl: 11   Continuous Blood Gluc Sensor (FREESTYLE LIBRE 14 DAY SENSOR) MISC, Use one applicator every 14 (fourteen) days., Disp: 6 each, Rfl: 3   cyclobenzaprine (FLEXERIL) 10 MG tablet, Take 10 mg by mouth 3 (three) times daily as needed for muscle spasms., Disp: , Rfl:    diclofenac Sodium (VOLTAREN) 1 % GEL, as needed for pain., Disp: , Rfl:    empagliflozin (JARDIANCE) 10 MG TABS tablet, Take 1 tablet (10 mg total) by mouth daily before breakfast., Disp: 30 tablet, Rfl: 6   ferrous sulfate 325 (65 FE) MG tablet, Take 1 tablet (325 mg total) by mouth every other day. (Patient taking differently:  Take 325 mg by mouth every other day.), Disp: 30 tablet, Rfl: 1   furosemide (LASIX) 40 MG tablet, TAKE 1 & 1/2 (ONE & ONE-HALF) TABLETS BY MOUTH TWICE DAILY, Disp: 90 tablet, Rfl: 0   gabapentin (NEURONTIN) 800 MG tablet, Take 1 tablet (800 mg total) by mouth every 6 (six) hours., Disp: 120 tablet, Rfl: 5   glipiZIDE (GLUCOTROL) 10 MG tablet, Take 1 tablet (10 mg total) by mouth 2 (two) times daily before a meal., Disp: 180 tablet, Rfl: 1   insulin glargine (LANTUS) 100 UNIT/ML injection, Inject 0.2 mLs (20 Units total) into the skin daily., Disp: 20 mL, Rfl: 1   Insulin Lispro Prot & Lispro (HUMALOG MIX 75/25 KWIKPEN) (75-25) 100 UNIT/ML Kwikpen, Inject 30 Units into the skin 2  (two) times daily., Disp: 54 mL, Rfl: 1   Insulin Syringe-Needle U-100 (TRUEPLUS INSULIN SYRINGE) 31G X 5/16" 0.3 ML MISC, USE 3 TIMES DAILY TO INJECT INSULIN, Disp: 300 each, Rfl: 3   metFORMIN (GLUCOPHAGE) 500 MG tablet, Take 1 tablet (500 mg total) by mouth 2 (two) times daily with a meal., Disp: 180 tablet, Rfl: 1   Multiple Vitamins-Minerals (CENTRUM SILVER 50+MEN) TABS, Take 1 tablet by mouth daily., Disp: , Rfl:    olopatadine (PATADAY) 0.1 % ophthalmic solution, Place 1 drop into both eyes 2 (two) times daily., Disp: 5 mL, Rfl: 12   Oxycodone HCl 10 MG TABS, Take 10 mg by mouth 3 (three) times daily., Disp: , Rfl:    pantoprazole (PROTONIX) 40 MG tablet, Take 1 tablet (40 mg total) by mouth daily., Disp: 90 tablet, Rfl: 1   potassium chloride SA (KLOR-CON M) 20 MEQ tablet, Take 2 tablets (40 mEq total) by mouth daily., Disp: 60 tablet, Rfl: 6   QUEtiapine (SEROQUEL) 300 MG tablet, Take 1.5 tablets (450 mg total) by mouth at bedtime., Disp: 135 tablet, Rfl: 1   sacubitril-valsartan (ENTRESTO) 24-26 MG, Take 1 tablet by mouth 2 (two) times daily., Disp: 180 tablet, Rfl: 3   Semaglutide, 2 MG/DOSE, (OZEMPIC, 2 MG/DOSE,) 8 MG/3ML SOPN, Inject 2 mg into the skin once a week., Disp: , Rfl:    spironolactone (ALDACTONE) 25 MG tablet, Take 0.5 tablets (12.5 mg total) by mouth at bedtime., Disp: 45 tablet, Rfl: 3   tadalafil (CIALIS) 20 MG tablet, Take 20 mg by mouth daily as needed for erectile dysfunction., Disp: , Rfl:    TRUEPLUS PEN NEEDLES 31G X 6 MM MISC, USE AS DIRECTED, Disp: 100 each, Rfl: 0  Social History   Tobacco Use  Smoking Status Former  Smokeless Tobacco Never    No Known Allergies Objective:  There were no vitals filed for this visit. There is no height or weight on file to calculate BMI. Constitutional Well developed. Well nourished.  Vascular Dorsalis pedis pulses palpable bilaterally. Posterior tibial pulses palpable bilaterally. Capillary refill normal to all  digits.  No cyanosis or clubbing noted. Pedal hair growth normal.  Neurologic Normal speech. Oriented to person, place, and time. Epicritic sensation to light touch grossly present bilaterally.  Dermatologic Nails well groomed and normal in appearance. No open wounds. No skin lesions.  Orthopedic: Right lower extremity swelling/edema 2+ pitting edema with some erythema noted.  No calf pain negative Homans test.  Motor or sensory functions are intact no open wounds or lesion noted   Radiographs: None Assessment:   1. Venous stasis of lower extremity   2. Generalized edema    Plan:  Patient was evaluated and treated and  all questions answered.  Right leg generalized swelling/edema with some erythema -All questions and concerns were discussed with the patient in extensive detail. -Patient may have some skin erythema likely due to the underlying venous stasis/edema.  I discussed with patient he states understanding. -Doxycycline was sent to pharmacy for skin and soft tissue prophylaxis -If it does not get better he will need to go to the emergency room for IV antibiotics.  He states understanding.  No follow-ups on file.

## 2022-06-19 NOTE — Progress Notes (Signed)
Remote ICD transmission.   

## 2022-06-20 ENCOUNTER — Other Ambulatory Visit (HOSPITAL_COMMUNITY): Payer: Self-pay | Admitting: Family Medicine

## 2022-06-20 ENCOUNTER — Inpatient Hospital Stay (HOSPITAL_BASED_OUTPATIENT_CLINIC_OR_DEPARTMENT_OTHER)
Admission: EM | Admit: 2022-06-20 | Discharge: 2022-06-24 | DRG: 603 | Disposition: A | Discharge status: Home / Self Care | Payer: Medicare Other | Source: Ambulatory Visit | Attending: Internal Medicine | Admitting: Internal Medicine

## 2022-06-20 ENCOUNTER — Emergency Department (HOSPITAL_BASED_OUTPATIENT_CLINIC_OR_DEPARTMENT_OTHER): Payer: Medicare Other

## 2022-06-20 ENCOUNTER — Other Ambulatory Visit: Payer: Self-pay

## 2022-06-20 ENCOUNTER — Other Ambulatory Visit: Payer: Self-pay | Admitting: Nurse Practitioner

## 2022-06-20 ENCOUNTER — Encounter (HOSPITAL_BASED_OUTPATIENT_CLINIC_OR_DEPARTMENT_OTHER): Payer: Self-pay | Admitting: Obstetrics and Gynecology

## 2022-06-20 ENCOUNTER — Other Ambulatory Visit (HOSPITAL_COMMUNITY): Payer: Self-pay

## 2022-06-20 DIAGNOSIS — M109 Gout, unspecified: Secondary | ICD-10-CM | POA: Diagnosis present

## 2022-06-20 DIAGNOSIS — L03115 Cellulitis of right lower limb: Secondary | ICD-10-CM | POA: Diagnosis not present

## 2022-06-20 DIAGNOSIS — I110 Hypertensive heart disease with heart failure: Secondary | ICD-10-CM | POA: Diagnosis present

## 2022-06-20 DIAGNOSIS — L0390 Cellulitis, unspecified: Secondary | ICD-10-CM | POA: Diagnosis present

## 2022-06-20 DIAGNOSIS — G894 Chronic pain syndrome: Secondary | ICD-10-CM | POA: Diagnosis present

## 2022-06-20 DIAGNOSIS — D649 Anemia, unspecified: Secondary | ICD-10-CM

## 2022-06-20 DIAGNOSIS — I480 Paroxysmal atrial fibrillation: Secondary | ICD-10-CM | POA: Diagnosis present

## 2022-06-20 DIAGNOSIS — Z79899 Other long term (current) drug therapy: Secondary | ICD-10-CM

## 2022-06-20 DIAGNOSIS — L03119 Cellulitis of unspecified part of limb: Secondary | ICD-10-CM | POA: Diagnosis present

## 2022-06-20 DIAGNOSIS — I429 Cardiomyopathy, unspecified: Secondary | ICD-10-CM | POA: Diagnosis present

## 2022-06-20 DIAGNOSIS — Z6841 Body Mass Index (BMI) 40.0 and over, adult: Secondary | ICD-10-CM

## 2022-06-20 DIAGNOSIS — I5032 Chronic diastolic (congestive) heart failure: Secondary | ICD-10-CM | POA: Diagnosis present

## 2022-06-20 DIAGNOSIS — Z7984 Long term (current) use of oral hypoglycemic drugs: Secondary | ICD-10-CM

## 2022-06-20 DIAGNOSIS — E1165 Type 2 diabetes mellitus with hyperglycemia: Secondary | ICD-10-CM | POA: Diagnosis present

## 2022-06-20 DIAGNOSIS — Z8719 Personal history of other diseases of the digestive system: Secondary | ICD-10-CM

## 2022-06-20 DIAGNOSIS — I4891 Unspecified atrial fibrillation: Secondary | ICD-10-CM | POA: Diagnosis present

## 2022-06-20 DIAGNOSIS — Z818 Family history of other mental and behavioral disorders: Secondary | ICD-10-CM

## 2022-06-20 DIAGNOSIS — Z8249 Family history of ischemic heart disease and other diseases of the circulatory system: Secondary | ICD-10-CM

## 2022-06-20 DIAGNOSIS — G4733 Obstructive sleep apnea (adult) (pediatric): Secondary | ICD-10-CM | POA: Diagnosis present

## 2022-06-20 DIAGNOSIS — Z7982 Long term (current) use of aspirin: Secondary | ICD-10-CM

## 2022-06-20 DIAGNOSIS — E1142 Type 2 diabetes mellitus with diabetic polyneuropathy: Secondary | ICD-10-CM

## 2022-06-20 DIAGNOSIS — I2510 Atherosclerotic heart disease of native coronary artery without angina pectoris: Secondary | ICD-10-CM | POA: Diagnosis present

## 2022-06-20 DIAGNOSIS — Z794 Long term (current) use of insulin: Secondary | ICD-10-CM

## 2022-06-20 DIAGNOSIS — N183 Chronic kidney disease, stage 3 unspecified: Secondary | ICD-10-CM | POA: Diagnosis present

## 2022-06-20 DIAGNOSIS — I5022 Chronic systolic (congestive) heart failure: Secondary | ICD-10-CM | POA: Diagnosis present

## 2022-06-20 DIAGNOSIS — Z95810 Presence of automatic (implantable) cardiac defibrillator: Secondary | ICD-10-CM

## 2022-06-20 DIAGNOSIS — Z79891 Long term (current) use of opiate analgesic: Secondary | ICD-10-CM

## 2022-06-20 DIAGNOSIS — D500 Iron deficiency anemia secondary to blood loss (chronic): Secondary | ICD-10-CM | POA: Diagnosis present

## 2022-06-20 DIAGNOSIS — F200 Paranoid schizophrenia: Secondary | ICD-10-CM | POA: Diagnosis present

## 2022-06-20 DIAGNOSIS — Z87891 Personal history of nicotine dependence: Secondary | ICD-10-CM

## 2022-06-20 DIAGNOSIS — D75839 Thrombocytosis, unspecified: Secondary | ICD-10-CM

## 2022-06-20 DIAGNOSIS — Z95818 Presence of other cardiac implants and grafts: Secondary | ICD-10-CM

## 2022-06-20 LAB — COMPREHENSIVE METABOLIC PANEL
ALT: 13 U/L (ref 0–44)
AST: 15 U/L (ref 15–41)
Albumin: 4.2 g/dL (ref 3.5–5.0)
Alkaline Phosphatase: 74 U/L (ref 38–126)
Anion gap: 13 (ref 5–15)
BUN: 18 mg/dL (ref 8–23)
CO2: 21 mmol/L — ABNORMAL LOW (ref 22–32)
Calcium: 9.8 mg/dL (ref 8.9–10.3)
Chloride: 103 mmol/L (ref 98–111)
Creatinine, Ser: 1.19 mg/dL (ref 0.61–1.24)
GFR, Estimated: >60 mL/min (ref >60)
Glucose, Bld: 87 mg/dL (ref 70–99)
Potassium: 3.8 mmol/L (ref 3.5–5.1)
Sodium: 137 mmol/L (ref 135–145)
Total Bilirubin: 0.4 mg/dL (ref 0.3–1.2)
Total Protein: 8.8 g/dL — ABNORMAL HIGH (ref 6.5–8.1)

## 2022-06-20 LAB — CBC WITH DIFFERENTIAL/PLATELET
Abs Immature Granulocytes: 0.03 10*3/uL (ref 0.00–0.07)
Basophils Absolute: 0 10*3/uL (ref 0.0–0.1)
Basophils Relative: 0 %
Eosinophils Absolute: 0.2 10*3/uL (ref 0.0–0.5)
Eosinophils Relative: 2 %
HCT: 35.7 % — ABNORMAL LOW (ref 39.0–52.0)
Hemoglobin: 9.9 g/dL — ABNORMAL LOW (ref 13.0–17.0)
Immature Granulocytes: 0 %
Lymphocytes Relative: 12 %
Lymphs Abs: 1.3 10*3/uL (ref 0.7–4.0)
MCH: 19.7 pg — ABNORMAL LOW (ref 26.0–34.0)
MCHC: 27.7 g/dL — ABNORMAL LOW (ref 30.0–36.0)
MCV: 71.1 fL — ABNORMAL LOW (ref 80.0–100.0)
Monocytes Absolute: 1.1 10*3/uL — ABNORMAL HIGH (ref 0.1–1.0)
Monocytes Relative: 10 %
Neutro Abs: 8.2 10*3/uL — ABNORMAL HIGH (ref 1.7–7.7)
Neutrophils Relative %: 76 %
Platelets: 475 10*3/uL — ABNORMAL HIGH (ref 150–400)
RBC: 5.02 MIL/uL (ref 4.22–5.81)
RDW: 18.5 % — ABNORMAL HIGH (ref 11.5–15.5)
WBC: 10.9 10*3/uL — ABNORMAL HIGH (ref 4.0–10.5)
nRBC: 0 % (ref 0.0–0.2)

## 2022-06-20 LAB — LACTIC ACID, PLASMA: Lactic Acid, Venous: 0.9 mmol/L (ref 0.5–1.9)

## 2022-06-20 MED ORDER — IOHEXOL 300 MG/ML  SOLN
100.0000 mL | Freq: Once | INTRAMUSCULAR | Status: AC | PRN
  Administered 2022-06-20: 100 mL via INTRAVENOUS

## 2022-06-20 MED ORDER — CEFAZOLIN SODIUM-DEXTROSE 2-4 GM/100ML-% IV SOLN
2.0000 g | Freq: Once | INTRAVENOUS | Status: AC
  Administered 2022-06-20: 2 g via INTRAVENOUS
  Filled 2022-06-20: qty 100

## 2022-06-20 NOTE — ED Triage Notes (Signed)
Patient reports to the ER for right sided leg and ankle cellulitis. Patient reports he is having pain and swelling. Patient has been to the podiatrist and has been on doxycyline for a possible infection. Patient sent to ER for IV antibiotics by podiatrist for non-improving cellulitic concerns of right leg/ankle

## 2022-06-20 NOTE — Progress Notes (Signed)
Paramedicine Encounter    Patient ID: [name], male    DOB: 1961/08/02, 61 y.o.   MRN: [id number]   Arrived for home visit for [name] where he was laying on the couch with his legs elevated complaining of bilateral feet pain and swelling. He states that the pain has been ongoing since our last visit on 7/11. He was seen by podiatry next day and was started on PO antibiotics in which he completed. He says the pain is worsening and the swelling has continued despite using the antibiotics. He says he has used Tylenol and Aleve for pain with no relief. He reports using his Oxycodone as needed but is trying to limit the use due to running low and needing to see pain management for next refill.   On assessment lower right leg is red and swollen around the ankle. His pain in mid tib down to right toes. Left lower leg has some mild swelling around ankle and top of foot.   He stood to walk to bathroom and was visibly in pain and had a hard time bearing weight on right foot.  He denied chest pain, dizziness or increased shortness of breath. No pitting edema noted. Lungs clear, no JVD.  He has been med complaint over the last two weeks. Only one missed bedtime dose.    Vitals were obtained and as noted-   BP- 138/70 HR- 72 RR- 16 O2-94% CBG- 149   I suggested him go to ER which was Dr. Ena Dawley recommendation after use of PO antibiotics and pain did not subside for possible IV antibiotics. [name] agreed and says his sister will take him.   I reviewed meds and confirmed same filling pill box for one week. He will need to add a few pills to pill box once he obtains his refills at [facility].Written notes on what meds and where they go were left on pill box. He agreed with plan.   We reviewed appointments and I advised him I would follow up in one week and check in with him tomorrow via telephone. He agreed with plan.   Refills: Potassium Pantoprazole Carvedilol  Lasix  Freestyle  Sensors   Home visit complete.   [name], [place] 06/20/2022   Patient Care Team: Bo Merino I, NP as PCP - General (Nurse Practitioner) [name], MD as PCP - Cardiology (Cardiology) [name], MD as PCP - Advanced Heart Failure (Cardiology) [name], MD as PCP - Electrophysiology (Cardiology) [name], LCSW as Social Worker (Licensed Clinical Social Worker) [name], MD as Consulting Physician (Gastroenterology)  Patient Active Problem List   Diagnosis Date Noted   Presence of Watchman left atrial appendage closure device 10/28/2021   Atrial fibrillation ([place]) 10/27/2021   Melena    Chronic diastolic CHF (congestive heart failure) ([place])    GI bleed 06/29/2021   Severe anemia 06/29/2021   Adenomatous polyp of ascending colon    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   Acute blood loss anemia    Angiodysplasia of stomach    Chronic anticoagulation    CHF exacerbation ([place]) 06/15/2020   AKI (acute kidney injury) ([place]) 06/15/2020   CKD (chronic kidney disease), stage III ([place]) 06/15/2020   Anemia due to chronic blood loss    Gastric AVM    Angiodysplasia of duodenum with hemorrhage    Acute on chronic systolic (congestive) heart failure ([place]) 04/05/2020   Anemia 04/05/2020   PAF (paroxysmal atrial fibrillation) ([place])  04/05/2020   OSA on CPAP 04/05/2020   Syncope and collapse 04/05/2020   Type 2 diabetes mellitus with hyperglycemia, with long-term current use of insulin ([place]) 12/03/2019   Diabetic polyneuropathy associated with type 2 diabetes mellitus ([place]) 12/03/2019   Erectile dysfunction 12/03/2019   Paranoid schizophrenia, chronic condition ([place]) 01/26/2015   Severe recurrent major depressive disorder with psychotic features ([place]) 01/26/2015   GAD (generalized anxiety disorder) 01/26/2015   OCD (obsessive compulsive disorder) 01/26/2015   Panic disorder without agoraphobia  01/26/2015   Insomnia 01/26/2015    Current Outpatient Medications:    acetaminophen (TYLENOL) 500 MG tablet, Take 2 tablets (1,000 mg total) by mouth 2 (two) times daily as needed., Disp: 180 tablet, Rfl: 3   albuterol (VENTOLIN HFA) 108 (90 Base) MCG/ACT inhaler, Inhale 2 puffs into the lungs every 6 (six) hours as needed for wheezing or shortness of breath., Disp: 18 g, Rfl: 1   aspirin EC 81 MG tablet, Take 1 tablet (81 mg total) by mouth daily. Swallow whole., Disp: 90 tablet, Rfl: 3   atorvastatin (LIPITOR) 40 MG tablet, Take 1 tablet (40 mg total) by mouth daily at 6 PM., Disp: 90 tablet, Rfl: 1   blood glucose meter kit and supplies KIT, Dispense based on patient and insurance preference. Use up to four times daily as directed. (FOR ICD-9 250.00, 250.01)., Disp: 1 each, Rfl: 0   carvedilol (COREG) 6.25 MG tablet, Take 1 tablet (6.25 mg total) by mouth 2 (two) times daily with a meal., Disp: 60 tablet, Rfl: 6   clotrimazole-betamethasone (LOTRISONE) cream, Apply 1 application  topically 2 (two) times daily., Disp: 30 g, Rfl: 0   Continuous Blood Gluc Receiver (FREESTYLE LIBRE [place]) DEVI, 1 each by Does not apply route as needed., Disp: 1 each, Rfl: 11   Continuous Blood Gluc Sensor (FREESTYLE LIBRE 14 DAY SENSOR) MISC, Use one applicator every 14 (fourteen) days., Disp: 6 each, Rfl: 3   cyclobenzaprine (FLEXERIL) 10 MG tablet, Take 10 mg by mouth 3 (three) times daily as needed for muscle spasms., Disp: , Rfl:    diclofenac Sodium (VOLTAREN) 1 % GEL, as needed for pain., Disp: , Rfl:    doxycycline (VIBRA-TABS) 100 MG tablet, Take 1 tablet (100 mg total) by mouth 2 (two) times daily., Disp: 28 tablet, Rfl: 0   empagliflozin (JARDIANCE) 10 MG TABS tablet, Take 1 tablet (10 mg total) by mouth daily before breakfast., Disp: 30 tablet, Rfl: 6   ferrous sulfate 325 (65 FE) MG tablet, Take 1 tablet (325 mg total) by mouth every other day. (Patient taking differently: Take 325 mg by mouth  every other day.), Disp: 30 tablet, Rfl: 1   furosemide (LASIX) 40 MG tablet, TAKE 1 & 1/2 (ONE & ONE-HALF) TABLETS BY MOUTH TWICE DAILY, Disp: 90 tablet, Rfl: 0   gabapentin (NEURONTIN) 800 MG tablet, Take 1 tablet (800 mg total) by mouth every 6 (six) hours., Disp: 120 tablet, Rfl: 5   glipiZIDE (GLUCOTROL) 10 MG tablet, Take 1 tablet (10 mg total) by mouth 2 (two) times daily before a meal., Disp: 180 tablet, Rfl: 1   insulin glargine (LANTUS) 100 UNIT/ML injection, Inject 0.2 mLs (20 Units total) into the skin daily., Disp: 20 mL, Rfl: 1   Insulin Lispro Prot & Lispro (HUMALOG MIX 75/25 KWIKPEN) (75-25) 100 UNIT/ML Kwikpen, Inject 30 Units into the skin 2 (two) times daily., Disp: 54 mL, Rfl: 1   Insulin Syringe-Needle U-100 (TRUEPLUS INSULIN SYRINGE) 31G X 5/16" 0.3  ML MISC, USE 3 TIMES DAILY TO INJECT INSULIN, Disp: 300 each, Rfl: 3   metFORMIN (GLUCOPHAGE) 500 MG tablet, Take 1 tablet (500 mg total) by mouth 2 (two) times daily with a meal., Disp: 180 tablet, Rfl: 1   Multiple Vitamins-Minerals (CENTRUM SILVER 50+MEN) TABS, Take 1 tablet by mouth daily., Disp: , Rfl:    olopatadine (PATADAY) 0.1 % ophthalmic solution, Place 1 drop into both eyes 2 (two) times daily., Disp: 5 mL, Rfl: 12   Oxycodone HCl 10 MG TABS, Take 10 mg by mouth 3 (three) times daily., Disp: , Rfl:    pantoprazole (PROTONIX) 40 MG tablet, Take 1 tablet (40 mg total) by mouth daily., Disp: 90 tablet, Rfl: 1   potassium chloride SA (KLOR-CON M) 20 MEQ tablet, Take 2 tablets (40 mEq total) by mouth daily., Disp: 60 tablet, Rfl: 6   QUEtiapine (SEROQUEL) 300 MG tablet, Take 1.5 tablets (450 mg total) by mouth at bedtime., Disp: 135 tablet, Rfl: 1   sacubitril-valsartan (ENTRESTO) 24-26 MG, Take 1 tablet by mouth 2 (two) times daily., Disp: 180 tablet, Rfl: 3   Semaglutide, 2 MG/DOSE, (OZEMPIC, 2 MG/DOSE,) 8 MG/3ML SOPN, Inject 2 mg into the skin once a week., Disp: , Rfl:    spironolactone (ALDACTONE) 25 MG tablet, Take 0.5  tablets (12.5 mg total) by mouth at bedtime., Disp: 45 tablet, Rfl: 3   tadalafil (CIALIS) 20 MG tablet, Take 20 mg by mouth daily as needed for erectile dysfunction., Disp: , Rfl:    TRUEPLUS PEN NEEDLES 31G X 6 MM MISC, USE AS DIRECTED, Disp: 100 each, Rfl: 0 No Known Allergies   Social History   Socioeconomic History   Marital status: Legally Separated    Spouse name: Not on file   Number of children: Not on file   Years of education: Not on file   Highest education level: Not on file  Occupational History   Not on file  Tobacco Use   Smoking status: Former   Smokeless tobacco: Never  Vaping Use   Vaping Use: Never used  Substance and Sexual Activity   Alcohol use: Not Currently    Alcohol/week: 2.0 standard drinks of alcohol    Types: 2 Cans of beer per week   Drug use: No   Sexual activity: Yes    Birth control/protection: None  Other Topics Concern   Not on file  Social History Narrative   Not on file   Social Determinants of Health   Financial Resource Strain: Low Risk  (12/23/2019)   Overall Financial Resource Strain (CARDIA)    Difficulty of Paying Living Expenses: Not very hard  Food Insecurity: No Food Insecurity (07/01/2021)   Hunger Vital Sign    Worried About Running Out of Food in the Last Year: Never true    Ran Out of Food in the Last Year: Never true  Transportation Needs: Unmet Transportation Needs (07/01/2021)   PRAPARE - Hydrologist (Medical): Yes    Lack of Transportation (Non-Medical): Yes  Physical Activity: Not on file  Stress: Not on file  Social Connections: Not on file  Intimate Partner Violence: Not on file    Physical Exam      Future Appointments  Date Time Provider [place]  08/07/2022 10:20 AM Fenton Foy, NP [place] None  09/01/2022  7:40 AM [facility] DEVICE REMOTES [facility]  12/01/2022  7:40 AM [facility] DEVICE REMOTES [facility]  03/02/2023  7:40 AM  [facility] DEVICE  REMOTES [facility]  06/01/2023  7:40 AM [facility] DEVICE REMOTES [facility]     ACTION: Home visit completed

## 2022-06-20 NOTE — ED Provider Notes (Signed)
[facility] Provider Note   CSN: [id number] Arrival date & time: 06/20/22  1758     History Chief Complaint  Patient presents with   Cellulitis    [name] is a 61 y.o. male DM, A-fib, CAD, CHF, schizophrenia to the ED secondary to lower extremity cellulitis.  The patient was started on doxycycline by Dr. Posey Pronto with podiatry.  He reports that the swelling has been present for the past 2 weeks.  He reports that he completed the course of doxycycline but still persisted to have a left lower extremity erythema, edema, worsening pain.  He was sent over by his podiatrist for IV antibiotics.  He denies any fever or chills.  Denies any nausea or vomiting.  Denies any chest pain or shortness of breath.  Denies any recent trauma to the leg.  HPI     Home Medications Prior to Admission medications   Medication Sig Start Date End Date Taking? Authorizing Provider  acetaminophen (TYLENOL) 500 MG tablet Take 2 tablets (1,000 mg total) by mouth 2 (two) times daily as needed. 07/02/21   Bonnell Public, MD  albuterol (VENTOLIN HFA) 108 (90 Base) MCG/ACT inhaler Inhale 2 puffs into the lungs every 6 (six) hours as needed for wheezing or shortness of breath. 12/23/21 12/23/22  [name], NP  aspirin EC 81 MG tablet Take 1 tablet (81 mg total) by mouth daily. Swallow whole. 04/26/22   Eileen Stanford, PA-C  atorvastatin (LIPITOR) 40 MG tablet Take 1 tablet (40 mg total) by mouth daily at 6 PM. 12/21/21 06/20/22  Milford, Maricela Bo, FNP  blood glucose meter kit and supplies KIT Dispense based on patient and insurance preference. Use up to four times daily as directed. (FOR ICD-9 250.00, 250.01). 10/16/19   Barb Merino, MD  carvedilol (COREG) 6.25 MG tablet Take 1 tablet (6.25 mg total) by mouth 2 (two) times daily with a meal. 06/07/22   Milford, Maricela Bo, FNP  clotrimazole-betamethasone (LOTRISONE) cream Apply 1 application  topically 2 (two) times daily.  05/05/22   Felipa Furnace, DPM  Continuous Blood Gluc Receiver (FREESTYLE LIBRE 14 DAY READER) DEVI 1 each by Does not apply route as needed. 10/26/20   Azzie Glatter, FNP  Continuous Blood Gluc Sensor (FREESTYLE LIBRE 14 DAY SENSOR) MISC Use one applicator every 14 (fourteen) days. 09/30/21 09/30/22  [name], NP  cyclobenzaprine (FLEXERIL) 10 MG tablet Take 10 mg by mouth 3 (three) times daily as needed for muscle spasms.    [provider]  diclofenac Sodium (VOLTAREN) 1 % GEL as needed for pain. 03/28/22   [provider]  doxycycline (VIBRA-TABS) 100 MG tablet Take 1 tablet (100 mg total) by mouth 2 (two) times daily. 06/07/22   Felipa Furnace, DPM  empagliflozin (JARDIANCE) 10 MG TABS tablet Take 1 tablet (10 mg total) by mouth daily before breakfast. 12/21/21   Milford, Maricela Bo, FNP  ferrous sulfate 325 (65 FE) MG tablet Take 1 tablet (325 mg total) by mouth every other day. Patient taking differently: Take 325 mg by mouth every other day. 12/23/21 06/20/22  [name], NP  furosemide (LASIX) 40 MG tablet TAKE 1 & 1/2 (ONE & ONE-HALF) TABLETS BY MOUTH TWICE DAILY 05/17/22   Milford, Maricela Bo, FNP  gabapentin (NEURONTIN) 800 MG tablet Take 1 tablet (800 mg total) by mouth every 6 (six) hours. 05/05/22 11/01/22  Passmore, Jake Church I, NP  glipiZIDE (GLUCOTROL) 10 MG tablet Take 1  tablet (10 mg total) by mouth 2 (two) times daily before a meal. 12/23/21 06/20/22  [name], NP  insulin glargine (LANTUS) 100 UNIT/ML injection Inject 0.2 mLs (20 Units total) into the skin daily. 12/23/21 06/21/22  [name], NP  Insulin Lispro Prot & Lispro (HUMALOG MIX 75/25 KWIKPEN) (75-25) 100 UNIT/ML Kwikpen Inject 30 Units into the skin 2 (two) times daily. 12/23/21 06/20/22  [name], NP  Insulin Syringe-Needle U-100 (TRUEPLUS INSULIN SYRINGE) 31G X 5/16" 0.3 ML MISC USE 3 TIMES DAILY TO INJECT INSULIN 12/23/21 12/23/22  [name], NP  metFORMIN (GLUCOPHAGE) 500 MG tablet  Take 1 tablet (500 mg total) by mouth 2 (two) times daily with a meal. 12/23/21 06/20/22  [name], NP  Multiple Vitamins-Minerals (CENTRUM SILVER 50+MEN) TABS Take 1 tablet by mouth daily.    [provider]  olopatadine (PATADAY) 0.1 % ophthalmic solution Place 1 drop into both eyes 2 (two) times daily. 02/02/22   [name], NP  Oxycodone HCl 10 MG TABS Take 10 mg by mouth 3 (three) times daily. 01/29/22   [provider]  pantoprazole (PROTONIX) 40 MG tablet Take 1 tablet (40 mg total) by mouth daily. 06/20/22   Fenton Foy, NP  potassium chloride SA (KLOR-CON M) 20 MEQ tablet Take 2 tablets (40 mEq total) by mouth daily. 06/07/22   Milford, Maricela Bo, FNP  QUEtiapine (SEROQUEL) 300 MG tablet Take 1.5 tablets (450 mg total) by mouth at bedtime. 12/23/21 06/20/22  [name], NP  sacubitril-valsartan (ENTRESTO) 24-26 MG Take 1 tablet by mouth 2 (two) times daily. 04/25/22   Milford, Maricela Bo, FNP  Semaglutide, 2 MG/DOSE, (OZEMPIC, 2 MG/DOSE,) 8 MG/3ML SOPN Inject 2 mg into the skin once a week.    [provider]  spironolactone (ALDACTONE) 25 MG tablet Take 0.5 tablets (12.5 mg total) by mouth at bedtime. 12/21/21   Milford, Maricela Bo, FNP  tadalafil (CIALIS) 20 MG tablet Take 20 mg by mouth daily as needed for erectile dysfunction.    [provider]  TRUEPLUS PEN NEEDLES 31G X 6 MM MISC USE AS DIRECTED 11/04/20   Azzie Glatter, FNP      Allergies    Patient has no known allergies.    Review of Systems   Review of Systems  Constitutional:  Negative for chills and fever.  Respiratory:  Negative for shortness of breath.   Cardiovascular:  Positive for leg swelling. Negative for chest pain.  Musculoskeletal:  Positive for arthralgias and myalgias.    Physical Exam Updated Vital Signs BP 124/86 (BP Location: Right Arm)   Pulse 81   Temp 98 F (36.7 C) (Oral)   Resp 16   Ht '6\' 2"'  (1.88 m)   Wt (!) 142.9 kg   SpO2 94%   BMI 40.44  kg/m  Physical Exam Vitals and nursing note reviewed.  Constitutional:      General: He is not in acute distress.    Appearance: Normal appearance. He is not ill-appearing or toxic-appearing.  HENT:     Head: Normocephalic and atraumatic.  Eyes:     General: No scleral icterus. Cardiovascular:     Rate and Rhythm: Normal rate. Rhythm irregular.     Pulses: Normal pulses.  Pulmonary:     Effort: Pulmonary effort is normal. No respiratory distress.     Breath sounds: Normal breath sounds.  Musculoskeletal:        General: No deformity.  Right lower leg: Edema present.     Left lower leg: Edema present.     Comments: Trace edema to bilateral lower legs, more on the right lower extremity.  The patient has some swelling and erythema noted to the right foot, ankle, into the anterior shin.  Please see additional pictures for more information.  Pulses intact.  Patient can still flex and extend his foot and ankle.  Cap refill intact.  Skin:    General: Skin is warm and dry.  Neurological:     General: No focal deficit present.     Mental Status: He is alert. Mental status is at baseline.     ED Results / Procedures / Treatments   Labs (all labs ordered are listed, but only abnormal results are displayed) Labs Reviewed  COMPREHENSIVE METABOLIC PANEL - Abnormal; Notable for the following components:      Result Value   CO2 21 (*)    Total Protein 8.8 (*)    All other components within normal limits  CBC WITH DIFFERENTIAL/PLATELET - Abnormal; Notable for the following components:   WBC 10.9 (*)    Hemoglobin 9.9 (*)    HCT 35.7 (*)    MCV 71.1 (*)    MCH 19.7 (*)    MCHC 27.7 (*)    RDW 18.5 (*)    Platelets 475 (*)    Neutro Abs 8.2 (*)    Monocytes Absolute 1.1 (*)    All other components within normal limits  LACTIC ACID, PLASMA  LACTIC ACID, PLASMA    EKG None  Radiology CT TIBIA FIBULA RIGHT W CONTRAST  Result Date: 06/20/2022 CLINICAL DATA:  Right lower  extremity cellulitis with pain and swelling for 3 weeks, no response to doxycycline. EXAM: CT OF THE RIGHT TIBIA FIBULA WITH CONTRAST; CT OF THE RIGHT ANKLE WITH CONTRAST; CT OF THE RIGHT FOOT WITH CONTRAST TECHNIQUE: Multidetector CT imaging of the right lower extremity was performed from the knee joint through the foot following the standard protocol during bolus administration of intravenous contrast. RADIATION DOSE REDUCTION: This exam was performed according to the departmental dose-optimization program which includes automated exposure control, adjustment of the mA and/or kV according to patient size and/or use of iterative reconstruction technique. CONTRAST:  165m OMNIPAQUE IOHEXOL 300 MG/ML  SOLN COMPARISON:  None Available. FINDINGS: Bones/Joint/Cartilage No destructive bone lesion or aggressive periosteal reaction is seen from the distal femoral shaft through the foot. There is only trace suprapatellar bursal fluid which could be physiologic. 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 There is enthesopathic spurring of the anterior aspect of the patella and the plantar aspect of the calcaneus, with additional nonerosive arthrosis of the tibiotalar joint, talonavicular joint, first MTP joint and toe joints and with hammertoe deformities of the second through fifth toes. The bones are normally mineralized. Evidence of fractures is not seen. There is a normal variant fabella posterior to the lateral femoral condyle. There is no popliteal cyst. Ligaments Suboptimally assessed by CT. Muscles and Tendons Area tendons are not optimally evaluated with CT but are grossly intact. There is normal muscle bulk in the distal thigh. In the foreleg there is normal muscle bulk with the  exception of mild fatty atrophy in the gastrocnemius. In the foot there is fatty atrophy in the intrinsic plantar musculature, common  in diabetics. No intramuscular fluid collection is seen and no intermuscular edema or fascial thickening is evident. There is no soft tissue gas. Soft tissues There is stranding edema anteriorly in the distal thigh extending over the anterior knee. No edema is seen inferior to this in proximal foreleg, but superficial edema is noted moderately in the mid to distal foreleg and foot, without visible underlying abscess or localized fluid collection. There is scattered nonlocalizing fluid in the medial distal foreleg and dorsal foot. No significant edema visible in the deep soft tissues. There is scattered calcification in the distal femoral artery order and popliteal trifurcation arteries. There are a few subcutaneous venous varicosities in the medial foreleg IMPRESSION: 1. Superficial edema without localizing abscess or underlying destructive bone lesion. 2. No visible significant edema in the deep soft tissues or appreciable fascial thickening. There is no soft tissue gas. Lack of overt findings of necrotizing fasciitis on CT however, should not delay surgical exploration if there is strong clinical evidence of the disease. 3. Mild fatty atrophy in the gastrocnemius, with fatty atrophy in the intrinsic plantar foot musculature. 4. Degenerative and enthesopathic changes. 5. Calcifications in the popliteal trifurcation arteries. Electronically Signed   By: Telford Nab M.D.   On: 06/20/2022 21:49   CT ANKLE RIGHT W CONTRAST  Result Date: 06/20/2022 CLINICAL DATA:  Right lower extremity cellulitis with pain and swelling for 3 weeks, no response to doxycycline. EXAM: CT OF THE RIGHT TIBIA FIBULA WITH CONTRAST; CT OF THE RIGHT ANKLE WITH CONTRAST; CT OF THE RIGHT FOOT WITH CONTRAST TECHNIQUE: Multidetector CT imaging of the right lower extremity was performed from the knee joint  through the foot following the standard protocol during bolus administration of intravenous contrast. RADIATION DOSE REDUCTION: This exam was performed according to the departmental dose-optimization program which includes automated exposure control, adjustment of the mA and/or kV according to patient size and/or use of iterative reconstruction technique. CONTRAST:  143m OMNIPAQUE IOHEXOL 300 MG/ML  SOLN COMPARISON:  None Available. FINDINGS: Bones/Joint/Cartilage No destructive bone lesion or aggressive periosteal reaction is seen from the distal femoral shaft through the foot. There is only trace suprapatellar bursal fluid which could be physiologic. 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 There is enthesopathic spurring of the anterior aspect of the patella and the plantar aspect of the calcaneus, with additional nonerosive arthrosis of the tibiotalar joint, talonavicular joint, first MTP joint and toe joints and with hammertoe deformities of the second through fifth toes. The bones are normally mineralized. Evidence of fractures is not seen. There is a normal variant fabella posterior to the lateral femoral condyle. There is no popliteal cyst. Ligaments Suboptimally assessed by CT. Muscles and Tendons Area tendons are not optimally evaluated with CT but are grossly intact. There is normal muscle bulk in the distal thigh. In the foreleg there is normal muscle bulk with the exception of mild fatty atrophy in the gastrocnemius. In the foot there is fatty atrophy in the intrinsic plantar musculature, common in diabetics. No intramuscular fluid collection is seen and no intermuscular edema or fascial thickening is evident. There is no soft tissue gas. Soft tissues There is stranding  edema anteriorly in the distal thigh extending over the anterior knee. No edema is seen inferior to this in proximal foreleg, but superficial edema is noted moderately  in the mid to distal foreleg and foot, without visible underlying abscess or localized fluid collection. There is scattered nonlocalizing fluid in the medial distal foreleg and dorsal foot. No significant edema visible in the deep soft tissues. There is scattered calcification in the distal femoral artery order and popliteal trifurcation arteries. There are a few subcutaneous venous varicosities in the medial foreleg IMPRESSION: 1. Superficial edema without localizing abscess or underlying destructive bone lesion. 2. No visible significant edema in the deep soft tissues or appreciable fascial thickening. There is no soft tissue gas. Lack of overt findings of necrotizing fasciitis on CT however, should not delay surgical exploration if there is strong clinical evidence of the disease. 3. Mild fatty atrophy in the gastrocnemius, with fatty atrophy in the intrinsic plantar foot musculature. 4. Degenerative and enthesopathic changes. 5. Calcifications in the popliteal trifurcation arteries. Electronically Signed   By: Telford Nab M.D.   On: 06/20/2022 21:49   CT FOOT RIGHT W CONTRAST  Result Date: 06/20/2022 CLINICAL DATA:  Right lower extremity cellulitis with pain and swelling for 3 weeks, no response to doxycycline. EXAM: CT OF THE RIGHT TIBIA FIBULA WITH CONTRAST; CT OF THE RIGHT ANKLE WITH CONTRAST; CT OF THE RIGHT FOOT WITH CONTRAST TECHNIQUE: Multidetector CT imaging of the right lower extremity was performed from the knee joint through the foot following the standard protocol during bolus administration of intravenous contrast. RADIATION DOSE REDUCTION: This exam was performed according to the departmental dose-optimization program which includes automated exposure control, adjustment of the mA and/or kV according to patient size and/or use  of iterative reconstruction technique. CONTRAST:  137m OMNIPAQUE IOHEXOL 300 MG/ML  SOLN COMPARISON:  None Available. FINDINGS: Bones/Joint/Cartilage No destructive bone lesion or aggressive periosteal reaction is seen from the distal femoral shaft through the foot. There is only trace suprapatellar bursal fluid which could be physiologic. 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 There is enthesopathic spurring of the anterior aspect of the patella and the plantar aspect of the calcaneus, with additional nonerosive arthrosis of the tibiotalar joint, talonavicular joint, first MTP joint and toe joints and with hammertoe deformities of the second through fifth toes. The bones are normally mineralized. Evidence of fractures is not seen. There is a normal variant fabella posterior to the lateral femoral condyle. There is no popliteal cyst. Ligaments Suboptimally assessed by CT. Muscles and Tendons Area tendons are not optimally evaluated with CT but are grossly intact. There is normal muscle bulk in the distal thigh. In the foreleg there is normal muscle bulk with the exception of mild fatty atrophy in the gastrocnemius. In the foot there is fatty atrophy in the intrinsic plantar musculature, common in diabetics. No intramuscular fluid collection is seen and no intermuscular edema or fascial thickening is evident. There is no soft tissue gas. Soft tissues There is stranding edema anteriorly in the distal thigh extending over the anterior knee. No edema is seen inferior to this in proximal foreleg, but superficial edema is noted moderately in the mid to distal foreleg and foot, without visible underlying abscess or localized fluid collection. There is scattered nonlocalizing fluid in the  medial distal foreleg and dorsal foot. No significant edema visible in the deep soft tissues. There is scattered calcification in the distal femoral artery order and popliteal trifurcation arteries. There are a  few subcutaneous venous varicosities in the medial foreleg IMPRESSION: 1. Superficial edema without localizing abscess or underlying destructive bone lesion. 2. No visible significant edema in the deep soft tissues or appreciable fascial thickening. There is no soft tissue gas. Lack of overt findings of necrotizing fasciitis on CT however, should not delay surgical exploration if there is strong clinical evidence of the disease. 3. Mild fatty atrophy in the gastrocnemius, with fatty atrophy in the intrinsic plantar foot musculature. 4. Degenerative and enthesopathic changes. 5. Calcifications in the popliteal trifurcation arteries. Electronically Signed   By: Telford Nab M.D.   On: 06/20/2022 21:49    Procedures Procedures   Medications Ordered in ED Medications  ceFAZolin (ANCEF) IVPB 2g/100 mL premix (2 g Intravenous New Bag/Given 06/20/22 2118)  iohexol (OMNIPAQUE) 300 MG/ML solution 100 mL (100 mLs Intravenous Contrast Given 06/20/22 2042)    ED Course/ Medical Decision Making/ A&P                           Medical Decision Making Amount and/or Complexity of Data Reviewed Labs: ordered. Radiology: ordered.  Risk Prescription drug management. Decision regarding hospitalization.   61 year old male presents emerged department after failed antibiotic therapy for cellulitis.  Differential diagnosis includes was not limited to deep space infection, osteomyelitis, necrotizing fasciitis, cellulitis.  Vital signs are unremarkable.  Physical exam as listed above.  I discussed the case with my attending who recommended CT with contrast imaging.  Imaging ordered.  Labs ordered as well.  Patient will likely need to be admitted given the failed outpatient antibiotics for inpatient IV  antibiotics.  We will start the patient on Ancef.  CT imaging shows: 1. Superficial edema without localizing abscess or underlying destructive bone lesion. 2. No visible significant edema in the deep soft tissues or appreciable fascial thickening. There is no soft tissue gas. Lack of overt findings of necrotizing fasciitis on CT however, should not delay surgical exploration if there is strong clinical evidence of the disease. 3. Mild fatty atrophy in the gastrocnemius, with fatty atrophy in the intrinsic plantar foot musculature. 4. Degenerative and enthesopathic changes. 5. Calcifications in the popliteal trifurcation arteries.  I independently reviewed and interpreted the patient's labs.  CMP shows mildly decreased bicarb at 21.  Mild increase in total protein 8.8 otherwise no electrolyte or LFT abnormalities.  GFR greater than 60.  CBC shows slight leukocytosis at 10.9 but patient does have a left shift into 8.2.  Thrombocytosis of 475.  Patient has a significant hemoglobin drop at 9.9 from his previous baseline around 13.  He denies any melena however.  Lactic acid is normal at 0.9.  Given the patient failed outpatient management, he will need to be admitted for IV antibiotics.  My physician admitted the patient to Dr. Hal Hope.  I discussed this case with my attending physician who cosigned this note including patient's presenting symptoms, physical exam, and planned diagnostics and interventions. Attending physician stated agreement with plan or made changes to plan which were implemented.   Attending physician assessed patient at bedside.  Final Clinical Impression(s) / ED Diagnoses Final diagnoses:  Cellulitis of right lower extremity    Rx / DC Orders ED Discharge Orders     None         Sherrell Puller, PA-C 06/20/22 [place], Samuel A, DO 06/21/22 1616

## 2022-06-20 NOTE — ED Provider Notes (Signed)
61 year old male history of DM, A-fib, CAD, CHF, schizophrenia to the ED secondary to lower extremity cellulitis.  He was started on doxycycline by Dr. Posey Pronto with podiatry.  Patient completed course of doxycycline which was started on the 12th with persistent left lower extremity erythema, worsening pain, worsening erythema.  Vital signs stable in the ED, CBC with leukocytosis 10.9, and left shift.  CT was ordered by PA.  Started on Ancef for cellulitis.  Recommend admission secondary to failure of outpatient oral antibiotics.

## 2022-06-21 ENCOUNTER — Other Ambulatory Visit (HOSPITAL_COMMUNITY): Payer: Self-pay | Admitting: *Deleted

## 2022-06-21 ENCOUNTER — Telehealth (HOSPITAL_COMMUNITY): Payer: Self-pay

## 2022-06-21 DIAGNOSIS — L03119 Cellulitis of unspecified part of limb: Secondary | ICD-10-CM | POA: Diagnosis present

## 2022-06-21 DIAGNOSIS — L03115 Cellulitis of right lower limb: Secondary | ICD-10-CM | POA: Diagnosis not present

## 2022-06-21 DIAGNOSIS — Z6841 Body Mass Index (BMI) 40.0 and over, adult: Secondary | ICD-10-CM | POA: Diagnosis not present

## 2022-06-21 DIAGNOSIS — G4733 Obstructive sleep apnea (adult) (pediatric): Secondary | ICD-10-CM | POA: Diagnosis not present

## 2022-06-21 DIAGNOSIS — E1165 Type 2 diabetes mellitus with hyperglycemia: Secondary | ICD-10-CM | POA: Diagnosis not present

## 2022-06-21 DIAGNOSIS — Z794 Long term (current) use of insulin: Secondary | ICD-10-CM

## 2022-06-21 DIAGNOSIS — Z818 Family history of other mental and behavioral disorders: Secondary | ICD-10-CM | POA: Diagnosis not present

## 2022-06-21 DIAGNOSIS — Z79899 Other long term (current) drug therapy: Secondary | ICD-10-CM | POA: Diagnosis not present

## 2022-06-21 DIAGNOSIS — N1831 Chronic kidney disease, stage 3a: Secondary | ICD-10-CM

## 2022-06-21 DIAGNOSIS — G894 Chronic pain syndrome: Secondary | ICD-10-CM | POA: Diagnosis not present

## 2022-06-21 DIAGNOSIS — M109 Gout, unspecified: Secondary | ICD-10-CM | POA: Diagnosis not present

## 2022-06-21 DIAGNOSIS — F200 Paranoid schizophrenia: Secondary | ICD-10-CM

## 2022-06-21 DIAGNOSIS — Z7982 Long term (current) use of aspirin: Secondary | ICD-10-CM | POA: Diagnosis not present

## 2022-06-21 DIAGNOSIS — I5032 Chronic diastolic (congestive) heart failure: Secondary | ICD-10-CM | POA: Diagnosis not present

## 2022-06-21 DIAGNOSIS — Z8719 Personal history of other diseases of the digestive system: Secondary | ICD-10-CM | POA: Diagnosis not present

## 2022-06-21 DIAGNOSIS — Z95810 Presence of automatic (implantable) cardiac defibrillator: Secondary | ICD-10-CM | POA: Diagnosis not present

## 2022-06-21 DIAGNOSIS — Z9989 Dependence on other enabling machines and devices: Secondary | ICD-10-CM

## 2022-06-21 DIAGNOSIS — D500 Iron deficiency anemia secondary to blood loss (chronic): Secondary | ICD-10-CM | POA: Diagnosis not present

## 2022-06-21 DIAGNOSIS — I429 Cardiomyopathy, unspecified: Secondary | ICD-10-CM | POA: Diagnosis not present

## 2022-06-21 DIAGNOSIS — I480 Paroxysmal atrial fibrillation: Secondary | ICD-10-CM | POA: Diagnosis not present

## 2022-06-21 DIAGNOSIS — Z7984 Long term (current) use of oral hypoglycemic drugs: Secondary | ICD-10-CM | POA: Diagnosis not present

## 2022-06-21 DIAGNOSIS — Z8249 Family history of ischemic heart disease and other diseases of the circulatory system: Secondary | ICD-10-CM | POA: Diagnosis not present

## 2022-06-21 DIAGNOSIS — I5022 Chronic systolic (congestive) heart failure: Secondary | ICD-10-CM | POA: Diagnosis not present

## 2022-06-21 DIAGNOSIS — I110 Hypertensive heart disease with heart failure: Secondary | ICD-10-CM | POA: Diagnosis not present

## 2022-06-21 DIAGNOSIS — I2510 Atherosclerotic heart disease of native coronary artery without angina pectoris: Secondary | ICD-10-CM | POA: Diagnosis not present

## 2022-06-21 DIAGNOSIS — Z95818 Presence of other cardiac implants and grafts: Secondary | ICD-10-CM | POA: Diagnosis not present

## 2022-06-21 DIAGNOSIS — Z87891 Personal history of nicotine dependence: Secondary | ICD-10-CM | POA: Diagnosis not present

## 2022-06-21 LAB — BASIC METABOLIC PANEL
Anion gap: 6 (ref 5–15)
BUN: 17 mg/dL (ref 8–23)
CO2: 23 mmol/L (ref 22–32)
Calcium: 8.4 mg/dL — ABNORMAL LOW (ref 8.9–10.3)
Chloride: 108 mmol/L (ref 98–111)
Creatinine, Ser: 0.9 mg/dL (ref 0.61–1.24)
GFR, Estimated: >60 mL/min (ref >60)
Glucose, Bld: 155 mg/dL — ABNORMAL HIGH (ref 70–99)
Potassium: 3.4 mmol/L — ABNORMAL LOW (ref 3.5–5.1)
Sodium: 137 mmol/L (ref 135–145)

## 2022-06-21 LAB — IRON AND TIBC
Iron: 13 ug/dL — ABNORMAL LOW (ref 45–182)
Saturation Ratios: 4 % — ABNORMAL LOW (ref 17.9–39.5)
TIBC: 371 ug/dL (ref 250–450)
UIBC: 358 ug/dL

## 2022-06-21 LAB — CBC
HCT: 33.5 % — ABNORMAL LOW (ref 39.0–52.0)
Hemoglobin: 9.3 g/dL — ABNORMAL LOW (ref 13.0–17.0)
MCH: 19.7 pg — ABNORMAL LOW (ref 26.0–34.0)
MCHC: 27.8 g/dL — ABNORMAL LOW (ref 30.0–36.0)
MCV: 71.1 fL — ABNORMAL LOW (ref 80.0–100.0)
Platelets: 421 10*3/uL — ABNORMAL HIGH (ref 150–400)
RBC: 4.71 MIL/uL (ref 4.22–5.81)
RDW: 18.1 % — ABNORMAL HIGH (ref 11.5–15.5)
WBC: 8.9 10*3/uL (ref 4.0–10.5)
nRBC: 0 % (ref 0.0–0.2)

## 2022-06-21 LAB — MRSA NEXT GEN BY PCR, NASAL: MRSA by PCR Next Gen: NOT DETECTED

## 2022-06-21 LAB — HIV ANTIBODY (ROUTINE TESTING W REFLEX): HIV Screen 4th Generation wRfx: NONREACTIVE

## 2022-06-21 LAB — RETICULOCYTES
Immature Retic Fract: 27.4 % — ABNORMAL HIGH (ref 2.3–15.9)
RBC.: 4.72 MIL/uL (ref 4.22–5.81)
Retic Count, Absolute: 66.6 10*3/uL (ref 19.0–186.0)
Retic Ct Pct: 1.4 % (ref 0.4–3.1)

## 2022-06-21 LAB — VITAMIN B12: Vitamin B-12: 477 pg/mL (ref 180–914)

## 2022-06-21 LAB — FOLATE: Folate: 14.1 ng/mL (ref >5.9)

## 2022-06-21 LAB — GLUCOSE, CAPILLARY
Glucose-Capillary: 176 mg/dL — ABNORMAL HIGH (ref 70–99)
Glucose-Capillary: 98 mg/dL (ref 70–99)
Glucose-Capillary: 122 mg/dL — ABNORMAL HIGH (ref 70–99)
Glucose-Capillary: 109 mg/dL — ABNORMAL HIGH (ref 70–99)

## 2022-06-21 LAB — FERRITIN: Ferritin: 13 ng/mL — ABNORMAL LOW (ref 24–336)

## 2022-06-21 MED ORDER — QUETIAPINE FUMARATE 50 MG PO TABS
300.0000 mg | ORAL_TABLET | Freq: Every day | ORAL | Status: DC
Start: 2022-06-21 — End: 2022-06-24
  Administered 2022-06-21 – 2022-06-23 (×3): 300 mg via ORAL
  Filled 2022-06-21 (×3): qty 6

## 2022-06-21 MED ORDER — CARVEDILOL 6.25 MG PO TABS: 6.2500 mg | ORAL_TABLET | Freq: Two times a day (BID) | ORAL | 6 refills | Status: DC

## 2022-06-21 MED ORDER — INSULIN ASPART PROT & ASPART (70-30 MIX) 100 UNIT/ML ~~LOC~~ SUSP
30.0000 [IU] | Freq: Two times a day (BID) | SUBCUTANEOUS | Status: DC
  Administered 2022-06-21 – 2022-06-24 (×6): 30 [IU] via SUBCUTANEOUS
  Filled 2022-06-21: qty 10

## 2022-06-21 MED ORDER — FUROSEMIDE 40 MG PO TABS: 60.0000 mg | ORAL_TABLET | Freq: Two times a day (BID) | ORAL | 3 refills | Status: DC

## 2022-06-21 MED ORDER — POTASSIUM CHLORIDE CRYS ER 20 MEQ PO TBCR
40.0000 meq | EXTENDED_RELEASE_TABLET | Freq: Once | ORAL | Status: AC
  Administered 2022-06-21: 40 meq via ORAL
  Filled 2022-06-21: qty 2

## 2022-06-21 MED ORDER — POTASSIUM CHLORIDE CRYS ER 20 MEQ PO TBCR: 40.0000 meq | EXTENDED_RELEASE_TABLET | Freq: Every day | ORAL | 6 refills | Status: DC

## 2022-06-21 MED ORDER — GABAPENTIN 800 MG PO TABS
800.0000 mg | ORAL_TABLET | Freq: Four times a day (QID) | ORAL | Status: DC
  Filled 2022-06-21: qty 1

## 2022-06-21 MED ORDER — SACUBITRIL-VALSARTAN 24-26 MG PO TABS
1.0000 | ORAL_TABLET | Freq: Two times a day (BID) | ORAL | Status: DC
  Administered 2022-06-21 – 2022-06-23 (×6): 1 via ORAL
  Filled 2022-06-21 (×7): qty 1

## 2022-06-21 MED ORDER — FUROSEMIDE 40 MG PO TABS
60.0000 mg | ORAL_TABLET | Freq: Two times a day (BID) | ORAL | Status: DC
  Administered 2022-06-21 – 2022-06-24 (×6): 60 mg via ORAL
  Filled 2022-06-21 (×6): qty 1

## 2022-06-21 MED ORDER — GABAPENTIN 400 MG PO CAPS
800.0000 mg | ORAL_CAPSULE | Freq: Four times a day (QID) | ORAL | Status: DC
  Administered 2022-06-21 – 2022-06-24 (×12): 800 mg via ORAL
  Filled 2022-06-21 (×12): qty 2

## 2022-06-21 MED ORDER — ONDANSETRON HCL 4 MG/2ML IJ SOLN: 4.0000 mg | Freq: Four times a day (QID) | INTRAMUSCULAR | Status: DC | PRN

## 2022-06-21 MED ORDER — ATORVASTATIN CALCIUM 40 MG PO TABS
40.0000 mg | ORAL_TABLET | Freq: Every day | ORAL | Status: DC
  Administered 2022-06-21 – 2022-06-23 (×3): 40 mg via ORAL
  Filled 2022-06-21 (×3): qty 1

## 2022-06-21 MED ORDER — CARVEDILOL 6.25 MG PO TABS
6.2500 mg | ORAL_TABLET | Freq: Two times a day (BID) | ORAL | Status: DC
  Administered 2022-06-21 – 2022-06-24 (×7): 6.25 mg via ORAL
  Filled 2022-06-21 (×7): qty 1

## 2022-06-21 MED ORDER — SODIUM CHLORIDE 0.9 % IV SOLN
250.0000 mg | Freq: Every day | INTRAVENOUS | Status: AC
  Administered 2022-06-21 – 2022-06-22 (×2): 250 mg via INTRAVENOUS
  Filled 2022-06-21 (×2): qty 20

## 2022-06-21 MED ORDER — ONDANSETRON HCL 4 MG PO TABS: 4.0000 mg | ORAL_TABLET | Freq: Four times a day (QID) | ORAL | Status: DC | PRN

## 2022-06-21 MED ORDER — OXYCODONE HCL 5 MG PO TABS
10.0000 mg | ORAL_TABLET | Freq: Three times a day (TID) | ORAL | Status: DC
  Administered 2022-06-21 – 2022-06-24 (×11): 10 mg via ORAL
  Filled 2022-06-21 (×11): qty 2

## 2022-06-21 MED ORDER — INSULIN GLARGINE-YFGN 100 UNIT/ML ~~LOC~~ SOLN
20.0000 [IU] | Freq: Every day | SUBCUTANEOUS | Status: DC
  Administered 2022-06-21 – 2022-06-23 (×3): 20 [IU] via SUBCUTANEOUS
  Filled 2022-06-21 (×4): qty 0.2

## 2022-06-21 MED ORDER — ACETAMINOPHEN 325 MG PO TABS
650.0000 mg | ORAL_TABLET | Freq: Four times a day (QID) | ORAL | Status: DC | PRN
  Administered 2022-06-22 – 2022-06-23 (×2): 650 mg via ORAL
  Filled 2022-06-21 (×2): qty 2

## 2022-06-21 MED ORDER — CYCLOBENZAPRINE HCL 5 MG PO TABS
10.0000 mg | ORAL_TABLET | Freq: Three times a day (TID) | ORAL | Status: DC | PRN
  Administered 2022-06-23: 10 mg via ORAL
  Filled 2022-06-21: qty 2

## 2022-06-21 MED ORDER — CEFAZOLIN SODIUM-DEXTROSE 2-4 GM/100ML-% IV SOLN
2.0000 g | Freq: Three times a day (TID) | INTRAVENOUS | Status: DC
  Administered 2022-06-21 – 2022-06-24 (×10): 2 g via INTRAVENOUS
  Filled 2022-06-21 (×11): qty 100

## 2022-06-21 NOTE — Assessment & Plan Note (Signed)
Med rec pending Doesn't appear to be on any psych meds from prior med rec though.

## 2022-06-21 NOTE — Assessment & Plan Note (Signed)
Not in exacerbation at this time. 1. Cont coreg 2. Med rec pending 3. Plan to continue other home CHF and HTN meds when med rec completed.

## 2022-06-21 NOTE — Assessment & Plan Note (Addendum)
Currently taking 75/25 30u BID AC at home + Lantus 20u with lunch at home. No short acting insulins 1. Hold metformin 2. Continue 70/30 30u BID AC 3. Continue lantus 20u at noon 4. CBG checks AC/HS 5. Med rec pending for other PO DM meds at this time.

## 2022-06-21 NOTE — Telephone Encounter (Signed)
[name] informed me he had a pain clinic appointment scheduled for today at 11:00 at [facility] and needed it to be rescheduled. I contacted them for him and got it rescheduled for next Wednesday at 3:15.  [name] was made aware and very grateful. He says he is admitted at [facility] for cellulitis and is receiving IV antibiotics. I will follow up upon d/c.   [name], [place] 06/21/2022

## 2022-06-21 NOTE — Assessment & Plan Note (Addendum)
Will continue coreg (looks like someone did script of this med just 14 days ago, so presumably this is current). Doesn't appear to be on chronic AC (presumably because he has a h/o GIB and they did a Watchman's procedure).

## 2022-06-21 NOTE — Assessment & Plan Note (Signed)
Creat 1.1 today, appears chronic, stable, and baseline.

## 2022-06-21 NOTE — Assessment & Plan Note (Addendum)
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 HGB had improved to 13.8 in Dec 2022 But now looks like anemia is back again with HGB 9.9.  And once again microcytic and hypochromic: suspicious for iron def anemia again. No large volume GIB, tan stool per patient, no melena no hematochezia. But makes me wonder if hes having occult / slow GIB again. 1. Check anemia pnl 2. SCDs only for DVT ppx (no chemo ppx) 3. Repeat CBC in AM 4. Cont PO iron when med rec completed. 1. ? Indication for IV iron depending on anemia pnl studies? 5. Sounds like he may want to follow up with GI in near future again (at a minimum).

## 2022-06-21 NOTE — H&P (Signed)
History and Physical    Patient: [name] [id number]:[id number] DOB: 02-15-1961 DOA: 06/20/2022 DOS: the patient was seen and examined on 06/21/2022 PCP: Bo Merino I, NP  Patient coming from: Home  Chief Complaint:  Chief Complaint  Patient presents with   Cellulitis   HPI: [name] is a 61 y.o. 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  Pt seen by Dr. Posey Pronto with podiatry.  RLE cellulitis, on doxycycline but failed outpt treatment so in to ED today.  Swelling, erythema, pain.  Sent in by podiatry to start IV ABx.  No N/V, no fevers, no CP, no SOB.  No recent trauma to leg.  No foot ulcer or open leg wounds.  Review of Systems: As mentioned in the history of present illness. All other systems reviewed and are negative. Past Medical History:  Diagnosis Date   AICD (automatic cardioverter/defibrillator) present    Anxiety    Atrial fibrillation (HCC)    CAD (coronary artery disease)    Cardiomyopathy (HCC)    CHF (congestive heart failure) ([place]) 09/2019   Depression    Diabetes mellitus    Erectile dysfunction 11/2019   GI bleeding    H/O right heart catheterization 09/2019   Hypertension    Presence of permanent cardiac pacemaker    Presence of Watchman left atrial appendage closure device 10/27/2021   27 mm Watchman Flex Device per Dr. Quentin Ore   Schizophrenia [facility])    Sleep apnea    uses cpap   Past Surgical History:  Procedure Laterality Date   BIOPSY  04/19/2021   Procedure: BIOPSY;  Surgeon: Doran Stabler, MD;  Location: [facility] ENDOSCOPY;  Service: Gastroenterology;;   COLONOSCOPY WITH PROPOFOL N/A 04/16/2021   Procedure: COLONOSCOPY WITH PROPOFOL;  Surgeon: [name], MD;  Location: [place];  Service: Gastroenterology;  Laterality: N/A;   CORONARY STENT PLACEMENT     ENTEROSCOPY N/A 04/08/2020    Procedure: ENTEROSCOPY;  Surgeon: Doran Stabler, MD;  Location: [place];  Service: Gastroenterology;  Laterality: N/A;   ENTEROSCOPY N/A 06/17/2020   Procedure: ENTEROSCOPY;  Surgeon: Irene Shipper, MD;  Location: [facility] ENDOSCOPY;  Service: Endoscopy;  Laterality: N/A;   ENTEROSCOPY N/A 04/15/2021   Procedure: ENTEROSCOPY;  Surgeon: [name], MD;  Location: [place];  Service: Gastroenterology;  Laterality: N/A;   ENTEROSCOPY N/A 04/19/2021   Procedure: ENTEROSCOPY;  Surgeon: Doran Stabler, MD;  Location: [place];  Service: Gastroenterology;  Laterality: N/A;   ENTEROSCOPY N/A 07/01/2021   Procedure: ENTEROSCOPY;  Surgeon: Jerene Bears, MD;  Location: [facility] ENDOSCOPY;  Service: Gastroenterology;  Laterality: N/A;   GIVENS CAPSULE STUDY  04/16/2021   Procedure: GIVENS CAPSULE STUDY;  Surgeon: [name], MD;  Location: [place];  Service: Gastroenterology;;   HEMOSTASIS CLIP PLACEMENT  04/08/2020   Procedure: HEMOSTASIS CLIP PLACEMENT;  Surgeon: Doran Stabler, MD;  Location: [place];  Service: Gastroenterology;;   HEMOSTASIS CLIP PLACEMENT  07/01/2021   Procedure: HEMOSTASIS CLIP PLACEMENT;  Surgeon: Jerene Bears, MD;  Location: [place];  Service: Gastroenterology;;   HEMOSTASIS CONTROL  04/08/2020   Procedure: HEMOSTASIS CONTROL;  Surgeon: Doran Stabler, MD;  Location: [facility] ENDOSCOPY;  Service: Gastroenterology;;   HEMOSTASIS CONTROL  06/17/2020   Procedure: HEMOSTASIS CONTROL;  Surgeon: Irene Shipper, MD;  Location: [place];  Service: Endoscopy;;  HERNIA REPAIR     HOT HEMOSTASIS N/A 04/15/2021   Procedure: HOT HEMOSTASIS (ARGON PLASMA COAGULATION/BICAP);  Surgeon: [name], MD;  Location: [place];  Service: Gastroenterology;  Laterality: N/A;   HOT HEMOSTASIS N/A 07/01/2021   Procedure: HOT HEMOSTASIS (ARGON PLASMA COAGULATION/BICAP);  Surgeon: Jerene Bears, MD;  Location: [facility] ENDOSCOPY;  Service: Gastroenterology;  Laterality:  N/A;   ICD IMPLANT N/A 09/03/2020   Procedure: ICD IMPLANT;  Surgeon: [name], MD;  Location: [place] CV LAB;  Service: Cardiovascular;  Laterality: N/A;   LEFT ATRIAL APPENDAGE OCCLUSION N/A 10/27/2021   Procedure: LEFT ATRIAL APPENDAGE OCCLUSION;  Surgeon: [name], MD;  Location: [place] CV LAB;  Service: Cardiovascular;  Laterality: N/A;   POLYPECTOMY  04/16/2021   Procedure: POLYPECTOMY;  Surgeon: [name], MD;  Location: [facility] ENDOSCOPY;  Service: Gastroenterology;;   RIGHT/LEFT HEART CATH AND CORONARY ANGIOGRAPHY N/A 10/14/2019   Procedure: RIGHT/LEFT HEART CATH AND CORONARY ANGIOGRAPHY;  Surgeon: Belva Crome, MD;  Location: [place] CV LAB;  Service: Cardiovascular;  Laterality: N/A;   SUBMUCOSAL TATTOO INJECTION  04/19/2021   Procedure: SUBMUCOSAL TATTOO INJECTION;  Surgeon: Doran Stabler, MD;  Location: [place];  Service: Gastroenterology;;   TEE WITHOUT CARDIOVERSION N/A 10/27/2021   Procedure: TRANSESOPHAGEAL ECHOCARDIOGRAM (TEE);  Surgeon: [name], MD;  Location: [place] CV LAB;  Service: Cardiovascular;  Laterality: N/A;   TEE WITHOUT CARDIOVERSION N/A 12/12/2021   Procedure: TRANSESOPHAGEAL ECHOCARDIOGRAM (TEE);  Surgeon: Geralynn Rile, MD;  Location: [place];  Service: Cardiovascular;  Laterality: N/A;   Social History:  reports that he has quit smoking. He has never been exposed to tobacco smoke. He has never used smokeless tobacco. He reports that he does not currently use alcohol after a past usage of about 2.0 standard drinks of alcohol per week. He reports that he does not use drugs.  No Known Allergies  Family History  Problem Relation Age of Onset   Heart failure Mother    Mental illness Sister    Mental illness Sister     Prior to Admission medications   Medication Sig Start Date End Date Taking? Authorizing Provider  acetaminophen (TYLENOL) 500 MG tablet Take 2 tablets (1,000 mg total) by  mouth 2 (two) times daily as needed. Patient taking differently: Take 1,000 mg by mouth 2 (two) times daily as needed for mild pain. 07/02/21  Yes Bonnell Public, MD  albuterol (VENTOLIN HFA) 108 (90 Base) MCG/ACT inhaler Inhale 2 puffs into the lungs every 6 (six) hours as needed for wheezing or shortness of breath. 12/23/21 12/23/22 Yes [name], NP  aspirin EC 81 MG tablet Take 1 tablet (81 mg total) by mouth daily. Swallow whole. 04/26/22  Yes Eileen Stanford, PA-C  atorvastatin (LIPITOR) 40 MG tablet Take 1 tablet (40 mg total) by mouth daily at 6 PM. 12/21/21 06/22/23 Yes [place], Maricela Bo, FNP  carvedilol (COREG) 6.25 MG tablet Take 1 tablet (6.25 mg total) by mouth 2 (two) times daily with a meal. 06/07/22  Yes [place], Maricela Bo, FNP  cyclobenzaprine (FLEXERIL) 10 MG tablet Take 10 mg by mouth 3 (three) times daily as needed for muscle spasms.   Yes [provider]  diclofenac Sodium (VOLTAREN) 1 % GEL Apply 2 g topically daily as needed for pain. 03/28/22  Yes [provider]  empagliflozin (JARDIANCE) 10 MG TABS tablet Take 1 tablet (10 mg total) by mouth daily before breakfast. 12/21/21  Yes  [place], Maricela Bo, FNP  ferrous sulfate 325 (65 FE) MG tablet Take 1 tablet (325 mg total) by mouth every other day. Patient taking differently: Take 325 mg by mouth every Monday, Wednesday, and Friday. 12/23/21 06/22/23 Yes King, Diona Foley, NP  furosemide (LASIX) 40 MG tablet TAKE 1 & 1/2 (ONE & ONE-HALF) TABLETS BY MOUTH TWICE DAILY Patient taking differently: Take 60 mg by mouth 2 (two) times daily. 05/17/22  Yes [place], Maricela Bo, FNP  gabapentin (NEURONTIN) 800 MG tablet Take 1 tablet (800 mg total) by mouth every 6 (six) hours. 05/05/22 11/01/22 Yes [place], Jake Church I, NP  glipiZIDE (GLUCOTROL) 10 MG tablet Take 1 tablet (10 mg total) by mouth 2 (two) times daily before a meal. 12/23/21 06/22/23 Yes King, Diona Foley, NP  insulin glargine (LANTUS) 100 UNIT/ML injection Inject 0.2  mLs (20 Units total) into the skin daily. 12/23/21 06/21/22 Yes King, Diona Foley, NP  Insulin Lispro Prot & Lispro (HUMALOG MIX 75/25 KWIKPEN) (75-25) 100 UNIT/ML Kwikpen Inject 30 Units into the skin 2 (two) times daily. 12/23/21 06/22/23 Yes [name], NP  metFORMIN (GLUCOPHAGE) 500 MG tablet Take 1 tablet (500 mg total) by mouth 2 (two) times daily with a meal. 12/23/21 06/22/23 Yes King, Diona Foley, NP  Multiple Vitamins-Minerals (CENTRUM SILVER 50+MEN) TABS Take 1 tablet by mouth daily.   Yes [provider]  Oxycodone HCl 10 MG TABS Take 10 mg by mouth 3 (three) times daily. 01/29/22  Yes [provider]  pantoprazole (PROTONIX) 40 MG tablet Take 1 tablet (40 mg total) by mouth daily. 06/20/22  Yes Fenton Foy, NP  potassium chloride SA (KLOR-CON M) 20 MEQ tablet Take 2 tablets (40 mEq total) by mouth daily. 06/07/22  Yes [place], Maricela Bo, FNP  QUEtiapine (SEROQUEL) 300 MG tablet Take 1.5 tablets (450 mg total) by mouth at bedtime. 12/23/21 06/22/23 Yes King, Diona Foley, NP  sacubitril-valsartan (ENTRESTO) 24-26 MG Take 1 tablet by mouth 2 (two) times daily. 04/25/22  Yes [place], Maricela Bo, FNP  blood glucose meter kit and supplies KIT Dispense based on patient and insurance preference. Use up to four times daily as directed. (FOR ICD-9 250.00, 250.01). 10/16/19   Barb Merino, MD  clotrimazole-betamethasone (LOTRISONE) cream Apply 1 application  topically 2 (two) times daily. Patient not taking: Reported on 06/21/2022 05/05/22   Felipa Furnace, DPM  Continuous Blood Gluc Receiver (FREESTYLE LIBRE 14 DAY READER) DEVI 1 each by Does not apply route as needed. 10/26/20   Azzie Glatter, FNP  Continuous Blood Gluc Sensor (FREESTYLE LIBRE 14 DAY SENSOR) MISC Use one applicator every 14 (fourteen) days. 09/30/21 09/30/22  [name], NP  doxycycline (VIBRA-TABS) 100 MG tablet Take 1 tablet (100 mg total) by mouth 2 (two) times daily. 06/07/22   Felipa Furnace, DPM  Insulin  Syringe-Needle U-100 (TRUEPLUS INSULIN SYRINGE) 31G X 5/16" 0.3 ML MISC USE 3 TIMES DAILY TO INJECT INSULIN 12/23/21 12/23/22  [name], NP  olopatadine (PATADAY) 0.1 % ophthalmic solution Place 1 drop into both eyes 2 (two) times daily. Patient not taking: Reported on 06/21/2022 02/02/22   [name], NP  Semaglutide, 2 MG/DOSE, (OZEMPIC, 2 MG/DOSE,) 8 MG/3ML SOPN Inject 2 mg into the skin once a week. Monday    [provider]  spironolactone (ALDACTONE) 25 MG tablet Take 0.5 tablets (12.5 mg total) by mouth at bedtime. 12/21/21   Rafael Bihari, FNP  tadalafil (CIALIS) 20 MG tablet Take 20 mg  by mouth daily as needed for erectile dysfunction.    [provider]  TRUEPLUS PEN NEEDLES 31G X 6 MM MISC USE AS DIRECTED 11/04/20   Azzie Glatter, FNP    Physical Exam: Vitals:   06/20/22 1805 06/20/22 1805 06/20/22 2201 06/21/22 0059  BP:  124/86  (!) 176/83  Pulse:  81  77  Resp:  16  18  Temp:  98.1 F (36.7 C) 98 F (36.7 C) 98.2 F (36.8 C)  TempSrc:   Oral Oral  SpO2:  94%  98%  Weight: (!) 142.9 kg     Height: _0  (1.88 m)      Constitutional: NAD, calm, comfortable Eyes: PERRL, lids and conjunctivae normal ENMT: Mucous membranes are moist. Posterior pharynx clear of any exudate or lesions.Normal dentition.  Neck: normal, supple, no masses, no thyromegaly Respiratory: clear to auscultation bilaterally, no wheezing, no crackles. Normal respiratory effort. No accessory muscle use.  Cardiovascular: Regular rate and rhythm, no murmurs / rubs / gallops. No extremity edema. 2+ pedal pulses. No carotid bruits.  Abdomen: no tenderness, no masses palpated. No hepatosplenomegaly. Bowel sounds positive.  Musculoskeletal: no clubbing / cyanosis. No joint deformity upper and lower extremities. Good ROM, no contractures. Normal muscle tone.  Skin: BLE venous stasis skin changes, but no open ulcers at this point.  No foot ulcers.  Skin erythema and increased edema  to R foot, ankle, and lower leg.  No crepitus. Neurologic: CN 2-12 grossly intact. Sensation intact, DTR normal. Strength 5/5 in all 4.  Psychiatric: Normal judgment and insight. Alert and oriented x 3. Normal mood.   Data Reviewed:    CBC    Component Value Date/Time   WBC 10.9 (H) 06/20/2022 1816   RBC 5.02 06/20/2022 1816   HGB 9.9 (L) 06/20/2022 1816   HGB 13.8 11/24/2021 1405   HCT 35.7 (L) 06/20/2022 1816   HCT 43.4 11/24/2021 1405   PLT 475 (H) 06/20/2022 1816   PLT 245 11/24/2021 1405   MCV 71.1 (L) 06/20/2022 1816   MCV 81 11/24/2021 1405   MCH 19.7 (L) 06/20/2022 1816   MCHC 27.7 (L) 06/20/2022 1816   RDW 18.5 (H) 06/20/2022 1816   RDW 14.6 11/24/2021 1405   LYMPHSABS 1.3 06/20/2022 1816   LYMPHSABS 1.2 09/01/2020 1020   MONOABS 1.1 (H) 06/20/2022 1816   EOSABS 0.2 06/20/2022 1816   EOSABS 0.2 09/01/2020 1020   BASOSABS 0.0 06/20/2022 1816   BASOSABS 0.0 09/01/2020 1020   CMP     Component Value Date/Time   NA 137 06/20/2022 1816   NA 131 (L) 11/24/2021 1405   K 3.8 06/20/2022 1816   CL 103 06/20/2022 1816   CO2 21 (L) 06/20/2022 1816   GLUCOSE 87 06/20/2022 1816   BUN 18 06/20/2022 1816   BUN 27 11/24/2021 1405   CREATININE 1.19 06/20/2022 1816   CREATININE 1.19 03/29/2015 1402   CALCIUM 9.8 06/20/2022 1816   PROT 8.8 (H) 06/20/2022 1816   PROT 7.8 11/04/2021 1201   ALBUMIN 4.2 06/20/2022 1816   ALBUMIN 4.5 11/04/2021 1201   AST 15 06/20/2022 1816   ALT 13 06/20/2022 1816   ALKPHOS 74 06/20/2022 1816   BILITOT 0.4 06/20/2022 1816   BILITOT 0.3 11/04/2021 1201   GFRNONAA >60 06/20/2022 1816   GFRAA 96 09/01/2020 1020   CT leg: IMPRESSION: 1. Superficial edema without localizing abscess or underlying destructive bone lesion. 2. No visible significant edema in the deep soft tissues or appreciable fascial  thickening. There is no soft tissue gas. Lack of overt findings of necrotizing fasciitis on CT however, should not delay surgical exploration  if there is strong clinical evidence of the disease. 3. Mild fatty atrophy in the gastrocnemius, with fatty atrophy in the intrinsic plantar foot musculature. 4. Degenerative and enthesopathic changes. 5. Calcifications in the popliteal trifurcation arteries.  Assessment and Plan: * Cellulitis, leg Cellulitis of R leg. No abscess, no subq air or other evidence of nec-fash on CT scan. Cellulitis pathway Failed outpt doxy Use ancef for the moment Check MRSA PCR nares (add Vanc if positive) Does have venous stasis skin changes on B legs, but No foot ulcer or open wound on either leg. 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 HGB had improved to 13.8 in Dec 2022 But now looks like anemia is back again with HGB 9.9.  And once again microcytic and hypochromic: suspicious for iron def anemia again. No large volume GIB, tan stool per patient, no melena no hematochezia. But makes me wonder if hes having occult / slow GIB again. Check anemia pnl SCDs only for DVT ppx (no chemo ppx) Repeat CBC in AM Cont PO iron when med rec completed. ? Indication for IV iron depending on anemia pnl studies? Sounds like he may want to follow up with GI in near future again (at a minimum).  Atrial fibrillation ([place]) Will continue coreg (looks like someone did script of this med just 14 days ago, so presumably this is current). Doesn't appear to be on chronic AC (presumably because he has a h/o GIB and they did a Watchman's procedure).  Chronic diastolic CHF (congestive heart failure) (HCC) Not in exacerbation at this time. Cont coreg Med rec pending Plan to continue other home CHF and HTN meds when med rec completed.  CKD (chronic kidney disease), stage III (HCC) Creat 1.1 today, appears chronic, stable, and baseline.  OSA on CPAP Cont CPAP per  home settings.  Type 2 diabetes mellitus with hyperglycemia, with long-term current use of insulin (HCC) Currently taking 75/25 30u BID AC at home + Lantus 20u with lunch at home. No short acting insulins Hold metformin Continue 70/30 30u BID AC Continue lantus 20u at noon CBG checks AC/HS Med rec pending for other PO DM meds at this time.  Paranoid schizophrenia, chronic condition ([place]) Med rec pending Doesn't appear to be on any psych meds from prior med rec though.      Advance Care Planning:   Code Status: Full Code  Consults: None  Family Communication: No family in room  Severity of Illness: The appropriate patient status for this patient is OBSERVATION. Observation status is judged to be reasonable and necessary in order to provide the required intensity of service to ensure the patient's safety. The patient's presenting symptoms, physical exam findings, and initial radiographic and laboratory data in the context of their medical condition is felt to place them at decreased risk for further clinical deterioration. Furthermore, it is anticipated that the patient will be medically stable for discharge from the hospital within 2 midnights of admission.   Author: Etta Quill., DO 06/21/2022 2:54 AM  For on call review [email or web address].

## 2022-06-21 NOTE — Assessment & Plan Note (Signed)
Cont CPAP per home settings.

## 2022-06-21 NOTE — Assessment & Plan Note (Addendum)
Cellulitis of R leg. No abscess, no subq air or other evidence of nec-fash on CT scan. 1. Cellulitis pathway 2. Failed outpt doxy 3. Use ancef for the moment 4. Check MRSA PCR nares (add Vanc if positive) 5. Does have venous stasis skin changes on B legs, but 1. No foot ulcer or open wound on either leg. 6. Cont home Oxycodone he takes for chronic pain for leg pain as well 7. PRN tylenol

## 2022-06-21 NOTE — Progress Notes (Signed)
Patient admitted early this morning for right lower extremity cellulitis with failed outpatient doxycycline treatment.  Patient seen and examined at bedside and plan of care discussed with him.  I have reviewed patient's medical records including this morning's H&P, current vitals, labs and medications myself.  Continue IV Ancef.  We will give 1 dose of IV iron for iron deficiency anemia.  Repeat a.m. labs.

## 2022-06-21 NOTE — Progress Notes (Signed)
Transition of Care [facility]) Screening Note  Patient Details  Name: [name] Date of Birth: 08/29/1961  Transition of Care [facility]) CM/SW Contact:    Sherie Don, LCSW Phone Number: 06/21/2022, 12:14 PM  Transition of Care Department [facility]) has reviewed patient and no TOC needs have been identified at this time. We will continue to monitor patient advancement through interdisciplinary progression rounds. If new patient transition needs arise, please place a TOC consult.

## 2022-06-22 DIAGNOSIS — Z6841 Body Mass Index (BMI) 40.0 and over, adult: Secondary | ICD-10-CM | POA: Diagnosis not present

## 2022-06-22 DIAGNOSIS — Z95818 Presence of other cardiac implants and grafts: Secondary | ICD-10-CM | POA: Diagnosis not present

## 2022-06-22 DIAGNOSIS — I5022 Chronic systolic (congestive) heart failure: Secondary | ICD-10-CM | POA: Diagnosis present

## 2022-06-22 DIAGNOSIS — L03119 Cellulitis of unspecified part of limb: Secondary | ICD-10-CM | POA: Diagnosis present

## 2022-06-22 DIAGNOSIS — Z794 Long term (current) use of insulin: Secondary | ICD-10-CM | POA: Diagnosis not present

## 2022-06-22 DIAGNOSIS — I480 Paroxysmal atrial fibrillation: Secondary | ICD-10-CM | POA: Diagnosis present

## 2022-06-22 DIAGNOSIS — L0390 Cellulitis, unspecified: Secondary | ICD-10-CM | POA: Diagnosis present

## 2022-06-22 DIAGNOSIS — I2510 Atherosclerotic heart disease of native coronary artery without angina pectoris: Secondary | ICD-10-CM | POA: Diagnosis present

## 2022-06-22 DIAGNOSIS — I110 Hypertensive heart disease with heart failure: Secondary | ICD-10-CM | POA: Diagnosis present

## 2022-06-22 DIAGNOSIS — Z95810 Presence of automatic (implantable) cardiac defibrillator: Secondary | ICD-10-CM | POA: Diagnosis not present

## 2022-06-22 DIAGNOSIS — L03115 Cellulitis of right lower limb: Secondary | ICD-10-CM | POA: Diagnosis present

## 2022-06-22 DIAGNOSIS — Z8249 Family history of ischemic heart disease and other diseases of the circulatory system: Secondary | ICD-10-CM | POA: Diagnosis not present

## 2022-06-22 DIAGNOSIS — Z8719 Personal history of other diseases of the digestive system: Secondary | ICD-10-CM | POA: Diagnosis not present

## 2022-06-22 DIAGNOSIS — M109 Gout, unspecified: Secondary | ICD-10-CM | POA: Diagnosis present

## 2022-06-22 DIAGNOSIS — D500 Iron deficiency anemia secondary to blood loss (chronic): Secondary | ICD-10-CM | POA: Diagnosis present

## 2022-06-22 DIAGNOSIS — Z79899 Other long term (current) drug therapy: Secondary | ICD-10-CM | POA: Diagnosis not present

## 2022-06-22 DIAGNOSIS — I429 Cardiomyopathy, unspecified: Secondary | ICD-10-CM | POA: Diagnosis present

## 2022-06-22 DIAGNOSIS — Z87891 Personal history of nicotine dependence: Secondary | ICD-10-CM | POA: Diagnosis not present

## 2022-06-22 DIAGNOSIS — G894 Chronic pain syndrome: Secondary | ICD-10-CM | POA: Diagnosis present

## 2022-06-22 DIAGNOSIS — Z7984 Long term (current) use of oral hypoglycemic drugs: Secondary | ICD-10-CM | POA: Diagnosis not present

## 2022-06-22 DIAGNOSIS — Z7982 Long term (current) use of aspirin: Secondary | ICD-10-CM | POA: Diagnosis not present

## 2022-06-22 DIAGNOSIS — F200 Paranoid schizophrenia: Secondary | ICD-10-CM | POA: Diagnosis present

## 2022-06-22 DIAGNOSIS — Z818 Family history of other mental and behavioral disorders: Secondary | ICD-10-CM | POA: Diagnosis not present

## 2022-06-22 DIAGNOSIS — G4733 Obstructive sleep apnea (adult) (pediatric): Secondary | ICD-10-CM | POA: Diagnosis present

## 2022-06-22 DIAGNOSIS — E1165 Type 2 diabetes mellitus with hyperglycemia: Secondary | ICD-10-CM | POA: Diagnosis present

## 2022-06-22 LAB — GLUCOSE, CAPILLARY
Glucose-Capillary: 138 mg/dL — ABNORMAL HIGH (ref 70–99)
Glucose-Capillary: 119 mg/dL — ABNORMAL HIGH (ref 70–99)
Glucose-Capillary: 134 mg/dL — ABNORMAL HIGH (ref 70–99)
Glucose-Capillary: 195 mg/dL — ABNORMAL HIGH (ref 70–99)

## 2022-06-22 MED ORDER — COLCHICINE 0.6 MG PO TABS
1.2000 mg | ORAL_TABLET | Freq: Once | ORAL | Status: AC
  Administered 2022-06-22: 1.2 mg via ORAL
  Filled 2022-06-22: qty 2

## 2022-06-22 MED ORDER — METHYLPREDNISOLONE SODIUM SUCC 125 MG IJ SOLR
60.0000 mg | INTRAMUSCULAR | Status: DC
  Administered 2022-06-22: 60 mg via INTRAVENOUS
  Filled 2022-06-22: qty 2

## 2022-06-22 NOTE — Progress Notes (Signed)
PT Cancellation Note  Patient Details Name: [name] MRN: [id number] DOB: July 16, 1961   Cancelled Treatment:    Reason Eval/Treat Not Completed: Pain limiting ability to participate, reports lleg  pain, RN checking on medication. Aletha Halim check back and have  patient try cane or crutches. [place] Office [phone] Weekend pager-[phone]    Claretha Cooper 06/22/2022, 2:19 PM

## 2022-06-22 NOTE — Evaluation (Signed)
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    CT of the lower extremity showed superficial edema without localizing abscess or underlying destructive bone lesion.  Clinical Impression  The patient is  reporting 10/10 pain  right foot/ankle. Patient recently given scheduled Pain medication. Patient  mobilized to sitting without assistance, Patient was unable to  place the right foot  onto floor due to c/o pain. Patient able  to place the  Left foot onto floor but not  able to tolerate pressure down through the foot, therefore could not stand.Patient does not tolerate any  tactile pressure   with palpation on either foot. RN aware of inability to stand. Patient reports that he has been limited ambulation, relying on support of furniture, etc, does not have an AD.  Pt admitted with above diagnosis.  Pt currently with functional limitations due to the deficits listed below (see PT Problem List). Pt will benefit from skilled PT to increase their independence and safety with mobility to allow discharge to the venue listed below.         Recommendations for follow up therapy are one component of a multi-disciplinary discharge planning process, led by the attending physician.  Recommendations may be updated based on patient status, additional functional criteria and insurance authorization.  Follow Up Recommendations No PT follow up (if improves)      Assistance Recommended at Discharge Set up Supervision/Assistance  Patient can  return home with the following  Help with stairs or ramp for entrance;Assist for transportation;Assistance with cooking/housework    Equipment Recommendations Rolling walker (2 wheels);Cane (TBD)  Recommendations for Other Services  OT consult    Functional Status Assessment Patient has had a recent decline in their functional status and demonstrates the ability to make significant improvements in function in a reasonable and predictable amount of time.     Precautions / Restrictions Precautions Precautions: Fall;ICD/Pacemaker Precaution Comments: AICD, severe pain L foot and ankle pain      Mobility  Bed Mobility Overal bed mobility: Modified Independent                  Transfers                   General transfer comment: unable to tolerate standing    Ambulation/Gait                  Stairs            Wheelchair Mobility    Modified Rankin (Stroke Patients Only)       Balance Overall balance assessment: Needs assistance Sitting-balance support: No upper extremity supported, Feet supported Sitting balance-Leahy Scale: Fair         Standing balance comment: unable                             Pertinent Vitals/Pain Pain Assessment Pain Assessment: 0-10 Pain Score: 10-Worst pain ever Pain Location: 10 Right foot and ankle, Left  ankle 6 Pain Intervention(s): Limited activity within patient's tolerance, Premedicated before session, Repositioned  Home Living Family/patient expects to be discharged to:: Private residence Living Arrangements: Other relatives;Alone Available Help at Discharge: Available 24 hours/day Type of Home: Apartment Home Access: Stairs to enter Entrance Stairs-Rails: Right Entrance Stairs-Number of Steps: 4   Home Layout: One level Home Equipment: None      Prior Function Prior Level of Function : Independent/Modified Independent             Mobility Comments: recently has been  limited ambulating due to Pain in LLE       Hand Dominance   Dominant Hand: Right    Extremity/Trunk Assessment   Upper Extremity Assessment Upper Extremity Assessment: Overall WFL for tasks assessed    Lower Extremity Assessment Lower Extremity Assessment: RLE deficits/detail;LLE deficits/detail RLE Deficits / Details: limited active Dflex due to C/O pain,  Knee ROM WFL, unable  to tolerate fott /pressure on the floor LLE Deficits / Details: able to dorsiflex ankle through partial range, can place foot onto floor    Cervical / Trunk Assessment Cervical / Trunk Assessment: Normal  Communication   Communication: No difficulties  Cognition Arousal/Alertness: Awake/alert Behavior During Therapy: WFL for tasks assessed/performed, Anxious Overall Cognitive Status: Within Functional Limits for tasks assessed                                 General Comments: appears in pain        General Comments      Exercises     Assessment/Plan    PT Assessment Patient needs continued PT services  PT Problem List Decreased range of motion;Pain;Decreased mobility;Decreased activity tolerance       PT Treatment Interventions DME instruction;Therapeutic activities;Gait training;Therapeutic exercise;Patient/family education;Stair training;Functional mobility training    PT Goals (Current goals can be found in the Care Plan section)  Acute Rehab PT Goals Patient Stated Goal: walk , get out of here, no foot pain PT Goal Formulation: With patient Time For Goal Achievement: 07/06/22 Potential to Achieve Goals: Good    Frequency Min 3X/week     Co-evaluation               AM-PAC PT "6 Clicks" Mobility  Outcome Measure Help needed turning from your back to your side while in a flat bed without using bedrails?: None Help needed moving from lying on your back to sitting on the side of a flat bed without using bedrails?: None Help needed moving to and from a bed to  a chair (including a wheelchair)?: Total Help needed standing up from a chair using your arms (e.g., wheelchair or bedside chair)?: Total Help needed to walk in hospital room?: Total Help needed climbing 3-5 steps with a railing? : Total 6 Click Score: 12    End of Session   Activity Tolerance: Patient limited by pain Patient left: in bed;with call bell/phone within reach Nurse Communication: Mobility status PT Visit Diagnosis: Pain;Difficulty in walking, not elsewhere classified (R26.2) Pain - Right/Left: Right Pain - part of body: Ankle and joints of foot    Time: 1638-4665 PT Time Calculation (min) (ACUTE ONLY): 37 min   Charges:   PT Evaluation $PT Eval Low Complexity: 1 Low PT Treatments $Therapeutic Activity: 8-22 mins        [facility] [phone] Weekend [phone]   Claretha Cooper 06/22/2022, 4:43 PM

## 2022-06-22 NOTE — Progress Notes (Signed)
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  He was sent in by podiatry/Dr. Posey Pronto for IV antibiotics.  CT of the lower extremity showed superficial edema without localizing abscess or underlying destructive bone lesion.  He was started on IV Ancef.  Assessment & Plan:   Right lower extremity cellulitis with failed outpatient doxycycline treatment -CT as above. -Currently on IV Ancef.  Cellulitis improving.  Still has some pain and does not feel ready to go home today yet.  Iron deficiency anemia -hemoglobin stable.  Currently on IV iron.  Continue oral iron supplementation on discharge. -Might benefit from outpatient GI evaluation  Paroxysmal A-fib with history of watchman's procedure -Rate controlled.  Continue Coreg.  Not on anticoagulation as an outpatient because of history of GI bleed.  Outpatient follow-up with cardiology  Chronic systolic heart failure with history of AICD placement -Continue Coreg, Lasix, Entresto.  Outpatient follow-up with cardiology.  Strict input and output.  Daily weights.  Fluid restriction.  OSA on CPAP -Continue CPAP  Morbid obesity -Outpatient follow-up  Diabetes mellitus type 2 with hyperglycemia -Continue current regimen.  Metformin on hold.  Schizophrenia -Continue quetiapine at bedtime.  Outpatient follow-up with PCP/psychiatry  Chronic pain -Continue current regimen.  Outpatient follow-up with PCP/pain management   DVT prophylaxis: SCDs Code Status: Full Family Communication: None at  bedside Disposition Plan: Status is: Observation The patient will require care spanning > 2 midnights and should be moved to inpatient because: Of need for IV antibiotics  Consultants: None  Procedures: None  Antimicrobials: Ancef   Subjective: Patient seen and examined at bedside.  Feels that his right leg swelling and pain is improving but still has some significant pain and does not feel ready to go home today.  No overnight fever, vomiting, chest pain or worsening shortness of breath reported.  Objective: Vitals:   06/21/22 1656 06/21/22 1938 06/21/22 2117 06/22/22 0512  BP: (!) 145/81  134/74 110/72  Pulse: 68 72 70 66  Resp: '17 18 17 16  '$ Temp: 97.6 F (36.4 C)  98.1 F (36.7 C) 98.2 F (36.8 C)  TempSrc: Oral  Oral Oral  SpO2: 97% 98% 95% 96%  Weight:      Height:        Intake/Output Summary (Last 24 hours) at 06/22/2022 0944 Last data filed at 06/22/2022 0600 Gross per 24 hour  Intake 1838.22 ml  Output 1950 ml  Net -111.78 ml   Filed Weights   06/20/22 1805  Weight: (!) 142.9 kg    Examination:  General exam: Appears calm and comfortable.  Currently on room air. Respiratory system: Bilateral decreased breath sounds at bases with basilar crackles Cardiovascular system: S1 & S2 heard, Rate controlled Gastrointestinal system: Abdomen is nondistended, soft and nontender. Normal bowel sounds heard. Extremities: No cyanosis, clubbing; mild lower extremity edema present  Central nervous system: Alert and oriented. No focal neurological deficits. Moving extremities Skin: Mild right lower extremity erythema with some tenderness without any discharge present Psychiatry: Mostly flat affect.  No signs of agitation.   Data Reviewed: I have personally reviewed following labs and imaging studies  CBC: Recent Labs  Lab 06/20/22 1816 06/21/22 0438  WBC 10.9* 8.9  NEUTROABS 8.2*  --   HGB 9.9* 9.3*  HCT 35.7* 33.5*  MCV 71.1* 71.1*  PLT 475* 421*   Basic  Metabolic Panel: Recent Labs  Lab 06/20/22 1816 06/21/22 0438  NA 137 137  K 3.8 3.4*  CL 103 108  CO2 21* 23  GLUCOSE 87 155*  BUN 18 17  CREATININE 1.19 0.90  CALCIUM 9.8 8.4*   GFR: Estimated Creatinine Clearance: 128.2 mL/min (by C-G formula based on SCr of 0.9 mg/dL). Liver Function Tests: Recent Labs  Lab 06/20/22 1816  AST 15  ALT 13  ALKPHOS 74  BILITOT 0.4  PROT 8.8*  ALBUMIN 4.2   No results for input(s): "LIPASE", "AMYLASE" in the last 168 hours. No results for input(s): "AMMONIA" in the last 168 hours. Coagulation Profile: No results for input(s): "INR", "PROTIME" in the last 168 hours. Cardiac Enzymes: No results for input(s): "CKTOTAL", "CKMB", "CKMBINDEX", "TROPONINI" in the last 168 hours. BNP (last 3 results) No results for input(s): "PROBNP" in the last 8760 hours. HbA1C: No results for input(s): "HGBA1C" in the last 72 hours. CBG: Recent Labs  Lab 06/21/22 0938 06/21/22 1230 06/21/22 1652 06/21/22 2122 06/22/22 0721  GLUCAP 176* 98 122* 109* 138*   Lipid Profile: No results for input(s): "CHOL", "HDL", "LDLCALC", "TRIG", "CHOLHDL", "LDLDIRECT" in the last 72 hours. Thyroid Function Tests: No results for input(s): "TSH", "T4TOTAL", "FREET4", "T3FREE", "THYROIDAB" in the last 72 hours. Anemia Panel: Recent Labs    06/21/22 0438  VITAMINB12 477  FOLATE 14.1  FERRITIN 13*  TIBC 371  IRON 13*  RETICCTPCT 1.4   Sepsis Labs: Recent Labs  Lab 06/20/22 1816  LATICACIDVEN 0.9    Recent Results (from the past 240 hour(s))  MRSA Next Gen by PCR, Nasal     Status: None   Collection Time: 06/21/22  2:13 AM   Specimen: Nasal Mucosa; Nasal Swab  Result Value Ref Range Status   MRSA by PCR Next Gen NOT DETECTED NOT DETECTED Final    Comment: (NOTE) The GeneXpert MRSA Assay (FDA approved for NASAL specimens only), is one component of a comprehensive MRSA colonization surveillance program. It is not intended to diagnose MRSA infection nor  to guide or monitor treatment for MRSA infections. Test performance is not FDA approved in patients less than 19 years old. Performed at [facility], [place] [address], [name], [place] [phone]          Radiology Studies: CT TIBIA FIBULA RIGHT W CONTRAST  Result Date: 06/20/2022 CLINICAL DATA:  Right lower extremity cellulitis with pain and swelling for 3 weeks, no response to doxycycline. EXAM: CT OF THE RIGHT TIBIA FIBULA WITH CONTRAST; CT OF THE RIGHT ANKLE WITH CONTRAST; CT OF THE RIGHT FOOT WITH CONTRAST TECHNIQUE: Multidetector CT imaging of the right lower extremity was performed from the knee joint through the foot following the standard protocol during bolus administration of intravenous contrast. RADIATION DOSE REDUCTION: This exam was performed according to the departmental dose-optimization program which includes automated exposure control, adjustment of the mA and/or kV according to patient size and/or use of iterative reconstruction technique. CONTRAST:  157m OMNIPAQUE IOHEXOL 300 MG/ML  SOLN COMPARISON:  None Available. FINDINGS: Bones/Joint/Cartilage No destructive bone lesion or aggressive periosteal reaction is seen from the distal femoral shaft through the foot. There is only trace suprapatellar bursal fluid which could be physiologic. There is degenerative arthrosis at the knee with  partial joint space loss in the lateral patellofemoral joint and medial femorotibial joint, and subcortical degenerative cystic changes in the lateral femoral condyle anteriorly, with small tricompartmental marginal osteophytes. There is enthesopathic spurring of the anterior aspect of the patella and the plantar aspect of the calcaneus, with additional nonerosive arthrosis of the tibiotalar joint, talonavicular joint, first MTP joint and toe joints and with hammertoe deformities of the second through fifth toes. The bones are normally mineralized. Evidence of fractures is not seen.  There is a normal variant fabella posterior to the lateral femoral condyle. There is no popliteal cyst. Ligaments Suboptimally assessed by CT. Muscles and Tendons Area tendons are not optimally evaluated with CT but are grossly intact. There is normal muscle bulk in the distal thigh. In the foreleg there is normal muscle bulk with the exception of mild fatty atrophy in the gastrocnemius. In the foot there is fatty atrophy in the intrinsic plantar musculature, common in diabetics. No intramuscular fluid collection is seen and no intermuscular edema or fascial thickening is evident. There is no soft tissue gas. Soft tissues There is stranding edema anteriorly in the distal thigh extending over the anterior knee. No edema is seen inferior to this in proximal foreleg, but superficial edema is noted moderately in the mid to distal foreleg and foot, without visible underlying abscess or localized fluid collection. There is scattered nonlocalizing fluid in the medial distal foreleg and dorsal foot. No significant edema visible in the deep soft tissues. There is scattered calcification in the distal femoral artery order and popliteal trifurcation arteries. There are a few subcutaneous venous varicosities in the medial foreleg IMPRESSION: 1. Superficial edema without localizing abscess or underlying destructive bone lesion. 2. No visible significant edema in the deep soft tissues or appreciable fascial thickening. There is no soft tissue gas. Lack of overt findings of necrotizing fasciitis on CT however, should not delay surgical exploration if there is strong clinical evidence of the disease. 3. Mild fatty atrophy in the gastrocnemius, with fatty atrophy in the intrinsic plantar foot musculature. 4. Degenerative and enthesopathic changes. 5. Calcifications in the popliteal trifurcation arteries. Electronically Signed   By: Telford Nab M.D.   On: 06/20/2022 21:49   CT ANKLE RIGHT W CONTRAST  Result Date:  06/20/2022 CLINICAL DATA:  Right lower extremity cellulitis with pain and swelling for 3 weeks, no response to doxycycline. EXAM: CT OF THE RIGHT TIBIA FIBULA WITH CONTRAST; CT OF THE RIGHT ANKLE WITH CONTRAST; CT OF THE RIGHT FOOT WITH CONTRAST TECHNIQUE: Multidetector CT imaging of the right lower extremity was performed from the knee joint through the foot following the standard protocol during bolus administration of intravenous contrast. RADIATION DOSE REDUCTION: This exam was performed according to the departmental dose-optimization program which includes automated exposure control, adjustment of the mA and/or kV according to patient size and/or use of iterative reconstruction technique. CONTRAST:  146m OMNIPAQUE IOHEXOL 300 MG/ML  SOLN COMPARISON:  None Available. FINDINGS: Bones/Joint/Cartilage No destructive bone lesion or aggressive periosteal reaction is seen from the distal femoral shaft through the foot. There is only trace suprapatellar bursal fluid which could be physiologic. 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 There is enthesopathic spurring of the anterior aspect of the patella and the plantar aspect of the calcaneus, with additional nonerosive arthrosis of the tibiotalar joint,  talonavicular joint, first MTP joint and toe joints and with hammertoe deformities of the second through fifth toes. The bones are normally mineralized. Evidence of fractures is not seen. There is a normal variant fabella posterior to the lateral femoral condyle. There is no popliteal cyst. Ligaments Suboptimally assessed by CT. Muscles and Tendons Area tendons are not optimally evaluated with CT but are grossly intact. There is normal muscle bulk in the distal thigh. In the foreleg  there is normal muscle bulk with the exception of mild fatty atrophy in the gastrocnemius. In the foot there is fatty atrophy in the intrinsic plantar musculature, common in diabetics. No intramuscular fluid collection is seen and no intermuscular edema or fascial thickening is evident. There is no soft tissue gas. Soft tissues There is stranding edema anteriorly in the distal thigh extending over the anterior knee. No edema is seen inferior to this in proximal foreleg, but superficial edema is noted moderately in the mid to distal foreleg and foot, without visible underlying abscess or localized fluid collection. There is scattered nonlocalizing fluid in the medial distal foreleg and dorsal foot. No significant edema visible in the deep soft tissues. There is scattered calcification in the distal femoral artery order and popliteal trifurcation arteries. There are a few subcutaneous venous varicosities in the medial foreleg IMPRESSION: 1. Superficial edema without localizing abscess or underlying destructive bone lesion. 2. No visible significant edema in the deep soft tissues or appreciable fascial thickening. There is no soft tissue gas. Lack of overt findings of necrotizing fasciitis on CT however, should not delay surgical exploration if there is strong clinical evidence of the disease. 3. Mild fatty atrophy in the gastrocnemius, with fatty atrophy in the intrinsic plantar foot musculature. 4. Degenerative and enthesopathic changes. 5. Calcifications in the popliteal trifurcation arteries. Electronically Signed   By: Telford Nab M.D.   On: 06/20/2022 21:49   CT FOOT RIGHT W CONTRAST  Result Date: 06/20/2022 CLINICAL DATA:  Right lower extremity cellulitis with pain and swelling for 3 weeks, no response to doxycycline. EXAM: CT OF THE RIGHT TIBIA FIBULA WITH CONTRAST; CT OF THE RIGHT ANKLE WITH CONTRAST; CT OF THE RIGHT FOOT WITH CONTRAST TECHNIQUE: Multidetector CT imaging of the right lower extremity was  performed from the knee joint through the foot following the standard protocol during bolus administration of intravenous contrast. RADIATION DOSE REDUCTION: This exam was performed according to the departmental dose-optimization program which includes automated exposure control, adjustment of the mA and/or kV according to patient size and/or use of iterative reconstruction technique. CONTRAST:  157m OMNIPAQUE IOHEXOL 300 MG/ML  SOLN COMPARISON:  None Available. FINDINGS: Bones/Joint/Cartilage No destructive bone lesion or aggressive periosteal reaction is seen from the distal femoral shaft through the foot. There is only trace suprapatellar bursal fluid which could be physiologic. 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 There is enthesopathic spurring of the anterior aspect of the patella and the plantar aspect of the calcaneus, with additional nonerosive arthrosis of the tibiotalar joint, talonavicular joint, first MTP joint and toe joints and with hammertoe deformities of the second through fifth toes. The bones are normally mineralized. Evidence of fractures is not seen. There is a normal variant fabella posterior to the lateral femoral condyle. There is no popliteal cyst. Ligaments Suboptimally assessed by CT. Muscles and Tendons Area  tendons are not optimally evaluated with CT but are grossly intact. There is normal muscle bulk in the distal thigh. In the foreleg there is normal muscle bulk with the exception of mild fatty atrophy in the gastrocnemius. In the foot there is fatty atrophy in the intrinsic plantar musculature, common in diabetics. No intramuscular fluid collection is seen and no intermuscular edema or fascial thickening is evident. There is no soft tissue gas.  Soft tissues There is stranding edema anteriorly in the distal thigh extending over the anterior knee. No edema is seen inferior to this in proximal foreleg, but superficial edema is noted moderately in the mid to distal foreleg and foot, without visible underlying abscess or localized fluid collection. There is scattered nonlocalizing fluid in the medial distal foreleg and dorsal foot. No significant edema visible in the deep soft tissues. There is scattered calcification in the distal femoral artery order and popliteal trifurcation arteries. There are a few subcutaneous venous varicosities in the medial foreleg IMPRESSION: 1. Superficial edema without localizing abscess or underlying destructive bone lesion. 2. No visible significant edema in the deep soft tissues or appreciable fascial thickening. There is no soft tissue gas. Lack of overt findings of necrotizing fasciitis on CT however, should not delay surgical exploration if there is strong clinical evidence of the disease. 3. Mild fatty atrophy in the gastrocnemius, with fatty atrophy in the intrinsic plantar foot musculature. 4. Degenerative and enthesopathic changes. 5. Calcifications in the popliteal trifurcation arteries. Electronically Signed   By: Telford Nab M.D.   On: 06/20/2022 21:49        Scheduled Meds:  atorvastatin  40 mg Oral q1800   carvedilol  6.25 mg Oral BID WC   furosemide  60 mg Oral BID   gabapentin  800 mg Oral Q6H   insulin aspart protamine- aspart  30 Units Subcutaneous BID WC   insulin glargine-yfgn  20 Units Subcutaneous Q1200   oxyCODONE  10 mg Oral TID   QUEtiapine  300 mg Oral QHS   sacubitril-valsartan  1 tablet Oral BID   Continuous Infusions:   ceFAZolin (ANCEF) IV 2 g (06/22/22 0541)   ferric gluconate (FERRLECIT) IVPB Stopped (06/21/22 1411)          Aline August, MD [facility] 06/22/2022, 9:44 AM

## 2022-06-23 DIAGNOSIS — I480 Paroxysmal atrial fibrillation: Secondary | ICD-10-CM | POA: Diagnosis not present

## 2022-06-23 DIAGNOSIS — G4733 Obstructive sleep apnea (adult) (pediatric): Secondary | ICD-10-CM | POA: Diagnosis not present

## 2022-06-23 DIAGNOSIS — I5022 Chronic systolic (congestive) heart failure: Secondary | ICD-10-CM | POA: Diagnosis not present

## 2022-06-23 DIAGNOSIS — L03115 Cellulitis of right lower limb: Secondary | ICD-10-CM | POA: Diagnosis not present

## 2022-06-23 LAB — URIC ACID: Uric Acid, Serum: 7.3 mg/dL (ref 3.7–8.6)

## 2022-06-23 LAB — GLUCOSE, CAPILLARY
Glucose-Capillary: 112 mg/dL — ABNORMAL HIGH (ref 70–99)
Glucose-Capillary: 183 mg/dL — ABNORMAL HIGH (ref 70–99)
Glucose-Capillary: 259 mg/dL — ABNORMAL HIGH (ref 70–99)
Glucose-Capillary: 251 mg/dL — ABNORMAL HIGH (ref 70–99)

## 2022-06-23 LAB — BASIC METABOLIC PANEL
Anion gap: 7 (ref 5–15)
BUN: 16 mg/dL (ref 8–23)
CO2: 26 mmol/L (ref 22–32)
Calcium: 8.6 mg/dL — ABNORMAL LOW (ref 8.9–10.3)
Chloride: 109 mmol/L (ref 98–111)
Creatinine, Ser: 0.85 mg/dL (ref 0.61–1.24)
GFR, Estimated: >60 mL/min (ref >60)
Glucose, Bld: 152 mg/dL — ABNORMAL HIGH (ref 70–99)
Potassium: 4.1 mmol/L (ref 3.5–5.1)
Sodium: 142 mmol/L (ref 135–145)

## 2022-06-23 LAB — MAGNESIUM: Magnesium: 2.2 mg/dL (ref 1.7–2.4)

## 2022-06-23 MED ORDER — METHYLPREDNISOLONE SODIUM SUCC 125 MG IJ SOLR
60.0000 mg | INTRAMUSCULAR | Status: DC
Start: 2022-06-23 — End: 2022-06-24
  Administered 2022-06-23 – 2022-06-24 (×2): 60 mg via INTRAVENOUS
  Filled 2022-06-23 (×2): qty 2

## 2022-06-23 MED ORDER — COLCHICINE 0.6 MG PO TABS
0.6000 mg | ORAL_TABLET | Freq: Every day | ORAL | Status: DC
  Administered 2022-06-23 – 2022-06-24 (×2): 0.6 mg via ORAL
  Filled 2022-06-23 (×2): qty 1

## 2022-06-23 NOTE — Evaluation (Signed)
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    CT of the lower extremity showed superficial edema without localizing abscess or underlying destructive bone lesion.   Clinical Impression   PTA patient was living alone in a 1-level apartment with several STE and was grossly I with ADLs/IADLs without AD. Patient repots being on disability. Reports increased difficulty with ADLs/IADLs with onset of R ankle pain. Patient currently functioning below baseline demonstrating observed ADLs including bathing/dressing and grooming at sink leel with supervision A and use of RW. Cues for walker manipulation and safety. Reports significant improvement in function since initial PT evaluation 7/27. Patient also limited by deficits listed below including decreased dynamic standing balance and continued pain in R ankle although demonstrates ability to ambulate this date with use of RW and would benefit from continued acute OT services in prep for safe d/c home with PRN supervision/assist from relatives. OT will continue to follow acutely.         Recommendations for follow up therapy are one component of a multi-disciplinary discharge planning process, led by the attending physician.  Recommendations may be updated based on patient status, additional functional criteria and insurance authorization.   Follow Up Recommendations  No OT follow up    Assistance Recommended at Discharge PRN  Patient can return home with the following A little  help with bathing/dressing/bathroom;Assistance with cooking/housework;Assist for transportation    Functional Status Assessment  Patient has had a recent decline in their functional status and demonstrates the ability to make significant improvements in function in a reasonable and predictable amount of time.  Equipment Recommendations  BSC/3in1    Recommendations for Other Services       Precautions / Restrictions Precautions Precautions: Fall;ICD/Pacemaker Precaution Comments: AICD Restrictions Weight Bearing Restrictions: No      Mobility Bed Mobility Overal bed mobility: Modified Independent                  Transfers Overall transfer level: Needs assistance Equipment used: Rolling walker (2 wheels) Transfers: Sit to/from Stand, Bed to chair/wheelchair/BSC Sit to Stand: Supervision Stand pivot transfers: Supervision   Step pivot transfers: Supervision     General transfer comment: Supervision A for sit to stand from elevated EOB. Supervision A for steps forward and to RW (for pain management). Steps to recliner with supervision A and cues for walker management.      Balance Overall balance assessment: Needs assistance Sitting-balance support: No upper extremity supported, Feet supported Sitting balance-Leahy Scale: Good     Standing balance support: Bilateral upper extremity supported, During functional activity, Reliant on assistive device for balance Standing balance-Leahy Scale: Fair                             ADL either performed or assessed with clinical judgement   ADL Overall ADL's : Needs assistance/impaired Eating/Feeding: Independent   Grooming: Supervision/safety;Standing   Upper Body Bathing: Supervision/ safety;Standing   Lower Body Bathing: Supervison/ safety;Sit to/from stand   Upper Body Dressing : Supervision/safety;Sitting   Lower Body Dressing: Supervision/safety;Sit to/from stand  Toilet Transfer:  Copy Details (indicate cue type and reason): Simulated with transfer to recliner           General ADL Comments: Completes ADLs standing at sink level with UE support on RW and S overall. Cues for safety with RW, activity pacing and importance of seated rest breaks for energy conservation and pain management.     Vision   Vision Assessment?: No apparent visual deficits     Perception     Praxis      Pertinent Vitals/Pain Pain Assessment Pain Assessment: 0-10 Pain Score: 7  Pain Location: R ankle Pain Descriptors / Indicators: Aching, Sore Pain Intervention(s): Limited activity within patient's tolerance, Monitored during session, Repositioned, RN gave pain meds during session     Hand Dominance Right   Extremity/Trunk Assessment Upper Extremity Assessment Upper Extremity Assessment: Overall WFL for tasks assessed       Cervical / Trunk Assessment Cervical / Trunk Assessment: Normal   Communication Communication Communication: No difficulties   Cognition Arousal/Alertness: Awake/alert Behavior During Therapy: WFL for tasks assessed/performed, Anxious Overall Cognitive Status: Within Functional Limits for tasks assessed                                 General Comments: appears in pain     General Comments  Reports 7/10 pain in R ankle initially. Reports decrease in pain during completion of ADLs standing at sink level.    Exercises     Shoulder Instructions      Home Living Family/patient expects to be discharged to:: Private residence Living Arrangements: Other relatives;Alone Available Help at Discharge: Available 24 hours/day Type of Home: Apartment Home Access: Stairs to enter Entrance Stairs-Number of Steps: 4 Entrance Stairs-Rails: Right Home Layout: One level     Bathroom Shower/Tub: Teacher, early years/pre: Standard     Home Equipment: None          Prior Functioning/Environment  Prior Level of Function : Independent/Modified Independent             Mobility Comments: Reports limited mobility with onset of R ankle pain ADLs Comments: I with ADLs at baseline. Recent difficulty with onset of R ankle pain.        OT Problem List: Impaired balance (sitting and/or standing);Pain      OT Treatment/Interventions: Self-care/ADL training;Therapeutic exercise;Energy conservation;DME and/or AE instruction;Therapeutic activities;Patient/family education;Balance training    OT Goals(Current goals can be found in the care plan section) Acute Rehab OT Goals Patient Stated Goal: To return home. OT Goal Formulation: With patient Time For Goal Achievement: 07/07/22 Potential to Achieve Goals: Good ADL Goals Pt Will Perform Grooming: with modified independence;standing Pt Will Perform Upper Body Dressing: Independently Pt Will Perform Lower Body Dressing: with modified independence;sit to/from stand Pt Will Transfer to Toilet: with modified independence;ambulating;bedside commode Pt Will Perform Toileting - Clothing Manipulation and hygiene: with modified independence;sit to/from stand Pt Will Perform Tub/Shower Transfer: Tub transfer;3 in 1;grab bars;rolling walker  OT Frequency: Min 2X/week    Co-evaluation              AM-PAC OT "6 Clicks" Daily Activity     Outcome Measure Help from another person eating meals?: None Help from another person taking care of personal grooming?: A Little Help from another person toileting, which includes using toliet, bedpan, or urinal?: A Little Help from another person bathing (including washing, rinsing, drying)?: A  Little Help from another person to put on and taking off regular upper body clothing?: A Little Help from another person to put on and taking off regular lower body clothing?: A Little 6 Click Score: 19   End of Session Equipment Utilized During Treatment: Gait belt;Rolling walker (2 wheels) Nurse Communication:  Mobility status  Activity Tolerance: Patient tolerated treatment well Patient left: Other (comment) (Continuation of bathing tasks standing at sink level with nursing staff present for supervision)  OT Visit Diagnosis: Unsteadiness on feet (R26.81);Pain Pain - Right/Left: Right Pain - part of body: Ankle and joints of foot                Time: 4801-6553 OT Time Calculation (min): 30 min Charges:  OT General Charges $OT Visit: 1 Visit OT Evaluation $OT Eval Low Complexity: 1 Low OT Treatments $Self Care/Home Management : 8-22 mins  Joelie Schou H. OTR/L Supplemental OT, Department of rehab services [phone]  Patriciann Becht R H. 06/23/2022, 9:33 AM

## 2022-06-23 NOTE — Plan of Care (Signed)
  Problem: Education: Goal: Knowledge of General Education information will improve Description Including pain rating scale, medication(s)/side effects and non-pharmacologic comfort measures Outcome: Progressing   

## 2022-06-23 NOTE — TOC Progression Note (Signed)
Transition of Care [facility]) - Progression Note   Patient Details  Name: [name] MRN: [id number] Date of Birth: 08-08-1961  Transition of Care [facility]) CM/SW [place], LCSW Phone Number: 06/23/2022, 3:21 PM  Clinical Narrative: PT evaluation recommended a rolling walker, but patient declined one at this time.   Barriers to Discharge: Continued Medical Work up  Readmission Risk Interventions    10/28/2021   12:15 PM  Readmission Risk Prevention Plan  Transportation Screening Complete  PCP or Specialist Appt within 3-5 Days Complete  HRI or [place] Complete  Social Work Consult for [place] Planning/Counseling Complete  Palliative Care Screening Not Applicable  Medication Review [employer]) Complete

## 2022-06-23 NOTE — Progress Notes (Signed)
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    CT of the lower extremity showed superficial edema without localizing abscess or underlying destructive bone lesion.    PT Comments    Pt sitting Indep EOB on arrival feeling "much better".  "I've been walking to the bathroom by myself".  So assisted with amb in hallway to the stairwell.  General Gait Details: much improved gait today (IV Steriods, pain meds) tolerated 155 feet with no need for any AD.  Good alternating gait. General stair comments: 50% VC's on proper safe tech using both hands on rail then proper sequencing "up with the good/down with the bad".  Has 4 steps to enter home.  Practiced twice. On eval, LPT rec a walker however due to great progress, pt no longer needs one.  Pt agreed.  Much progress after receiving IV Steroids and pain meds.    Recommendations for follow up therapy are one component of a multi-disciplinary discharge planning process, led by the attending physician.  Recommendations may be updated based on patient status, additional functional criteria and insurance authorization.  Follow Up Recommendations  No PT follow up     Assistance Recommended at Discharge None  Patient can return home with the following Help with stairs or ramp for entrance;Assist for transportation;Assistance with cooking/housework   Equipment Recommendations  None recommended by PT    Recommendations for Other Services       Precautions / Restrictions  Precautions Precautions: Fall Precaution Comments: AICD Restrictions Weight Bearing Restrictions: No     Mobility  Bed Mobility Overal bed mobility: Modified Independent             General bed mobility comments: sitting EOB on arrival    Transfers Overall transfer level: Modified independent Equipment used: None Transfers: Sit to/from Stand, Bed to chair/wheelchair/BSC Sit to Stand: Modified independent (Device/Increase time) Stand pivot transfers: Modified independent (Device/Increase time)         General transfer comment: self able off all surfaces    Ambulation/Gait Ambulation/Gait assistance: Supervision Gait Distance (Feet): 155 Feet Assistive device: None Gait Pattern/deviations: Step-through pattern Gait velocity: WFL     General Gait Details: much improved gait today (IV Steriods, pain meds) tolerated 155 feet with no need for any AD.  Good alternating gait.   Stairs Stairs: Yes Stairs assistance: Supervision, Min guard Stair Management: One rail Left, Forwards, Step to pattern Number of Stairs: 8 General stair comments: 50% VC's on proper safe tech using both hands on rail then proper sequencing "up with the good/down with the bad".  Has 4 steps to enter home.  Practiced twice.   Wheelchair Mobility    Modified Rankin (Stroke Patients Only)       Balance                                            Cognition Arousal/Alertness: Awake/alert Behavior During Therapy: WFL for tasks assessed/performed, Anxious Overall Cognitive Status: Within Functional Limits for tasks assessed  General Comments: AxO x 3 feeling "much better"        Exercises      General Comments        Pertinent Vitals/Pain Pain Assessment Pain Assessment: Faces Faces Pain Scale: Hurts a little bit Pain Location: R ankle "much better than it was" Pain Descriptors / Indicators: Aching Pain  Intervention(s): Monitored during session    Home Living                          Prior Function            PT Goals (current goals can now be found in the care plan section) Progress towards PT goals: Progressing toward goals    Frequency    Min 3X/week      PT Plan Current plan remains appropriate    Co-evaluation              AM-PAC PT "6 Clicks" Mobility   Outcome Measure  Help needed turning from your back to your side while in a flat bed without using bedrails?: None Help needed moving from lying on your back to sitting on the side of a flat bed without using bedrails?: None Help needed moving to and from a bed to a chair (including a wheelchair)?: None Help needed standing up from a chair using your arms (e.g., wheelchair or bedside chair)?: None Help needed to walk in hospital room?: None Help needed climbing 3-5 steps with a railing? : A Little 6 Click Score: 23    End of Session Equipment Utilized During Treatment: Gait belt Activity Tolerance: Patient tolerated treatment well Patient left: in bed;with call bell/phone within reach Nurse Communication: Mobility status PT Visit Diagnosis: Pain;Difficulty in walking, not elsewhere classified (R26.2) Pain - Right/Left: Right Pain - part of body: Ankle and joints of foot     Time: 1513-1530 PT Time Calculation (min) (ACUTE ONLY): 17 min  Charges:  $Gait Training: 8-22 mins                     Rica Koyanagi  PTA [place] Office M-F          [phone] Weekend pager [phone]

## 2022-06-23 NOTE — Progress Notes (Addendum)
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  He was sent in by podiatry/Dr. Posey Pronto for IV antibiotics.  CT of the lower extremity showed superficial edema without localizing abscess or underlying destructive bone lesion.  He was started on IV Ancef.  Assessment & Plan:   Right lower extremity cellulitis with failed outpatient doxycycline treatment -CT as above. -Currently on IV Ancef.  Cellulitis has significantly improved.  Still having some intermittent pain and not ready to go home yet.  Possible acute gout -Patient received a dose of IV Solu-Medrol and oral colchicine on 06/22/2022 evening and feels that his pain is improving.  Will give another dose of IV Solu-Medrol; continue colchicine.  Iron deficiency anemia -hemoglobin stable.  Monitor intermittently.  Currently on IV iron.  Continue oral iron supplementation on discharge. -Might benefit from outpatient GI evaluation  Paroxysmal A-fib with history of watchman's procedure -Rate controlled.  Continue Coreg.  Not on anticoagulation as an outpatient because of history of GI bleed.  Outpatient follow-up with cardiology  Chronic systolic heart failure with history of AICD placement -Continue Coreg, Lasix, Entresto.  Outpatient follow-up with cardiology.  Strict input and output.  Daily weights.  Fluid restriction.  OSA on CPAP -Continue CPAP  Morbid obesity -Outpatient follow-up  Diabetes mellitus type 2 with hyperglycemia -Continue current regimen.  Metformin on  hold.  Schizophrenia -Continue quetiapine at bedtime.  Outpatient follow-up with PCP/psychiatry  Chronic pain -Continue current regimen.  Outpatient follow-up with PCP/pain management  CKD III has been ruled out   DVT prophylaxis: SCDs Code Status: Full Family Communication: None at bedside Disposition Plan: Status is: inpatient because: Of need for IV antibiotics and IV steroids  Consultants: None  Procedures: None  Antimicrobials: Ancef   Subjective: Patient seen and examined at bedside.  Right foot pain is improving and has felt much better since he received his IV steroid yesterday evening.  Hoping to get another dose of IV steroid today.  Does not feel ready to go home today.  No overnight fever, vomiting, chest pain reported. Objective: Vitals:   06/22/22 1239 06/22/22 2123 06/23/22 0452 06/23/22 0458  BP: 109/63 134/78  120/86  Pulse: 71 91  66  Resp: '18 20  18  '$ Temp: 97.8 F (36.6 C) 98.3 F (36.8 C)  98.3 F (36.8 C)  TempSrc: Oral Oral  Oral  SpO2: 94% 93%  98%  Weight:   (!) 140.9 kg (!) 141 kg  Height:        Intake/Output Summary (Last 24 hours) at 06/23/2022 0833 Last data filed at 06/23/2022 0453 Gross per 24 hour  Intake 1641.34 ml  Output 1125 ml  Net 516.34 ml    Filed Weights   06/20/22 1805 06/23/22 0452 06/23/22 0458  Weight: (!) 142.9 kg (!) 140.9 kg (!) 141 kg    Examination:  General: On room air.  No distress ENT/neck: No thyromegaly.  JVD is not elevated  respiratory: Decreased breath sounds at bases bilaterally with some crackles; no wheezing  CVS: S1-S2 heard, rate controlled currently Abdominal: Soft, obese, nontender, slightly distended; no organomegaly, bowel sounds are heard  Extremities: Trace lower extremity edema; no cyanosis  CNS: Awake and alert.  No focal neurologic deficit.  Moves extremities Lymph: No obvious lymphadenopathy Skin: No obvious ecchymosis/lesions  psych: Affect, judgment and mood are normal   musculoskeletal: Right foot swelling and redness has much improved, still mildly tender.  No ulcers noted.   Data Reviewed: I have personally reviewed following labs and imaging studies  CBC: Recent Labs  Lab 06/20/22 1816 06/21/22 0438  WBC 10.9* 8.9  NEUTROABS 8.2*  --   HGB 9.9* 9.3*  HCT 35.7* 33.5*  MCV 71.1* 71.1*  PLT 475* 421*    Basic Metabolic Panel: Recent Labs  Lab 06/20/22 1816 06/21/22 0438 06/23/22 0427  NA 137 137 142  K 3.8 3.4* 4.1  CL 103 108 109  CO2 21* 23 26  GLUCOSE 87 155* 152*  BUN '18 17 16  '$ CREATININE 1.19 0.90 0.85  CALCIUM 9.8 8.4* 8.6*  MG  --   --  2.2    GFR: Estimated Creatinine Clearance: 134.7 mL/min (by C-G formula based on SCr of 0.85 mg/dL). Liver Function Tests: Recent Labs  Lab 06/20/22 1816  AST 15  ALT 13  ALKPHOS 74  BILITOT 0.4  PROT 8.8*  ALBUMIN 4.2    No results for input(s): "LIPASE", "AMYLASE" in the last 168 hours. No results for input(s): "AMMONIA" in the last 168 hours. Coagulation Profile: No results for input(s): "INR", "PROTIME" in the last 168 hours. Cardiac Enzymes: No results for input(s): "CKTOTAL", "CKMB", "CKMBINDEX", "TROPONINI" in the last 168 hours. BNP (last 3 results) No results for input(s): "PROBNP" in the last 8760 hours. HbA1C: No results for input(s): "HGBA1C" in the last 72 hours. CBG: Recent Labs  Lab 06/22/22 0721 06/22/22 1233 06/22/22 1633 06/22/22 2119 06/23/22 0725  GLUCAP 138* 119* 134* 195* 112*    Lipid Profile: No results for input(s): "CHOL", "HDL", "LDLCALC", "TRIG", "CHOLHDL", "LDLDIRECT" in the last 72 hours. Thyroid Function Tests: No results for input(s): "TSH", "T4TOTAL", "FREET4", "T3FREE", "THYROIDAB" in the last 72 hours. Anemia Panel: Recent Labs    06/21/22 0438  VITAMINB12 477  FOLATE 14.1  FERRITIN 13*  TIBC 371  IRON 13*  RETICCTPCT 1.4    Sepsis Labs: Recent Labs  Lab 06/20/22 1816  LATICACIDVEN 0.9     Recent Results (from  the past 240 hour(s))  MRSA Next Gen by PCR, Nasal     Status: None   Collection Time: 06/21/22  2:13 AM   Specimen: Nasal Mucosa; Nasal Swab  Result Value Ref Range Status   MRSA by PCR Next Gen NOT DETECTED NOT DETECTED Final    Comment: (NOTE) The GeneXpert MRSA Assay (FDA approved for NASAL specimens only), is one component of a comprehensive MRSA colonization surveillance program. It is not intended to diagnose MRSA infection nor to guide or monitor treatment for MRSA infections. Test performance is not FDA approved in patients less than 64 years old. Performed at [facility], [place] [address] [place], [place] [address]          Radiology Studies: No results found.      Scheduled Meds:  atorvastatin  40 mg Oral q1800   carvedilol  6.25 mg Oral BID WC   furosemide  60 mg Oral BID   gabapentin  800 mg Oral Q6H   insulin aspart protamine- aspart  30 Units Subcutaneous BID WC   insulin glargine-yfgn  20 Units Subcutaneous Q1200   methylPREDNISolone (SOLU-MEDROL) injection  60 mg Intravenous Q24H  oxyCODONE  10 mg Oral TID   QUEtiapine  300 mg Oral QHS   sacubitril-valsartan  1 tablet Oral BID   Continuous Infusions:   ceFAZolin (ANCEF) IV 2 g (06/23/22 0530)          Aline August, MD [facility] 06/23/2022, 8:33 AM

## 2022-06-23 NOTE — Progress Notes (Deleted)
Transition of Care [facility]) Screening Note  Patient Details  Name: [name] Date of Birth: [birth year]  Transition of Care [facility]) CM/SW Contact:    Sherie Don, LCSW Phone Number: 06/23/2022, 3:19 PM  Transition of Care Department [facility]) has reviewed patient and no TOC needs have been identified at this time. We will continue to monitor patient advancement through interdisciplinary progression rounds. If new patient transition needs arise, please place a TOC consult.

## 2022-06-24 DIAGNOSIS — L03115 Cellulitis of right lower limb: Secondary | ICD-10-CM | POA: Diagnosis not present

## 2022-06-24 DIAGNOSIS — Z95810 Presence of automatic (implantable) cardiac defibrillator: Secondary | ICD-10-CM

## 2022-06-24 DIAGNOSIS — I480 Paroxysmal atrial fibrillation: Secondary | ICD-10-CM | POA: Diagnosis not present

## 2022-06-24 DIAGNOSIS — I5022 Chronic systolic (congestive) heart failure: Secondary | ICD-10-CM | POA: Diagnosis not present

## 2022-06-24 LAB — GLUCOSE, CAPILLARY: Glucose-Capillary: 158 mg/dL — ABNORMAL HIGH (ref 70–99)

## 2022-06-24 MED ORDER — CEPHALEXIN 500 MG PO CAPS
500.0000 mg | ORAL_CAPSULE | Freq: Three times a day (TID) | ORAL | 0 refills | Status: AC
Start: 2022-06-24 — End: 2022-06-28

## 2022-06-24 MED ORDER — COLCHICINE 0.6 MG PO TABS: ORAL_TABLET | ORAL | 0 refills | Status: DC

## 2022-06-24 MED ORDER — PREDNISONE 20 MG PO TABS: 40.0000 mg | ORAL_TABLET | Freq: Every day | ORAL | 0 refills | Status: AC

## 2022-06-24 NOTE — Progress Notes (Signed)
Reviewed written d/c instructions w pt and all questions answered. He verbalized understanding. D/ C via w/c w all belongings in stable condition. 

## 2022-06-24 NOTE — Discharge Summary (Signed)
Physician Discharge Summary  [name] PNT:[id number] DOB: Mar 09, 1961 DOA: 06/20/2022  PCP: Bo Merino I, NP  Admit date: 06/20/2022 Discharge date: 06/24/2022  Admitted From: Home Disposition: Home  Recommendations for Outpatient Follow-up:  Follow up with PCP in 1 week with repeat CBC/BMP Outpatient follow-up with cardiology Follow up in ED if symptoms worsen or new appear   Home Health: No Equipment/Devices: None  Discharge Condition: Stable CODE STATUS: Full Diet recommendation: Heart healthy/fluid restriction of up to 1200 cc a day/carb modified diet  Brief/Interim Summary: 61 year old male with history of diabetes mellitus type 2, hypertension, paroxysmal A-fib status post watchman's procedure, schizophrenia, 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  He was sent in by podiatry/Dr. Posey Pronto for IV antibiotics.  CT of the lower extremity showed superficial edema without localizing abscess or underlying destructive bone lesion.  He was started on IV Ancef.  He was also subsequently treated for possible acute gout flare with IV Solu-Medrol and colchicine.  Subsequently, his lower extremity erythema and pain have much improved.  He is tolerating PT.  He will be discharged home today on oral Keflex, prednisone and colchicine.  Outpatient follow-up with PCP.  Discharge Diagnoses:   Right lower extremity cellulitis with failed outpatient doxycycline treatment -CT as above. -Currently on IV Ancef.  Cellulitis has significantly improved.  -Discharge home on oral Keflex for few more days.  Possible acute gout -Treated with IV Solu-Medrol and oral colchicine.  Lower extremity pain has much improved. -Discharge home on oral prednisone 40 mg daily for 7 days, colchicine 0.6 mg daily for 7 days then as  needed for acute gout flare.  PCP to decide if patient needs to be on allopurinol.    Iron deficiency anemia -hemoglobin stable.  Monitor intermittently.  Treated with IV iron.  Continue oral iron supplementation on discharge. -Might benefit from outpatient GI evaluation   Paroxysmal A-fib with history of watchman's procedure -Rate controlled.  Continue Coreg.  Not on anticoagulation as an outpatient because of history of GI bleed.  Outpatient follow-up with cardiology   Chronic systolic heart failure with history of AICD placement -Continue Coreg, Lasix, Entresto.  Outpatient follow-up with cardiology.  Continue diet and and fluid restriction.    OSA on CPAP -Continue CPAP   Morbid obesity -Outpatient follow-up   Diabetes mellitus type 2 with hyperglycemia -Resume home regimen.  Carb modified diet.  Schizophrenia -Continue quetiapine at bedtime.  Outpatient follow-up with PCP/psychiatry  Chronic pain -Continue current regimen.  Outpatient follow-up with PCP/pain management   CKD III has been ruled out  Discharge Instructions  Discharge Instructions     Diet - low sodium heart healthy   Complete by: As directed    Diet Carb Modified   Complete by: As directed    Increase activity slowly   Complete by: As directed       Allergies as of 06/24/2022   No Known Allergies      Medication List     STOP taking these medications    clotrimazole-betamethasone cream Commonly known as: Lotrisone   doxycycline 100 MG tablet Commonly known as: VIBRA-TABS   olopatadine 0.1 % ophthalmic solution Commonly known as: Pataday       TAKE these medications    acetaminophen 500 MG tablet Commonly known as: TYLENOL Take 2 tablets (1,000 mg total) by mouth  2 (two) times daily as needed. What changed: reasons to take this   albuterol 108 (90 Base) MCG/ACT inhaler Commonly known as: VENTOLIN HFA Inhale 2 puffs into the lungs every 6 (six) hours as needed for wheezing or  shortness of breath.   aspirin EC 81 MG tablet Take 1 tablet (81 mg total) by mouth daily. Swallow whole.   atorvastatin 40 MG tablet Commonly known as: LIPITOR Take 1 tablet (40 mg total) by mouth daily at 6 PM.   blood glucose meter kit and supplies Kit Dispense based on patient and insurance preference. Use up to four times daily as directed. (FOR ICD-9 250.00, 250.01).   carvedilol 6.25 MG tablet Commonly known as: COREG Take 1 tablet (6.25 mg total) by mouth 2 (two) times daily with a meal.   Centrum Silver 50+Men Tabs Take 1 tablet by mouth daily.   cephALEXin 500 MG capsule Commonly known as: KEFLEX Take 1 capsule (500 mg total) by mouth 3 (three) times daily for 4 days.   colchicine 0.6 MG tablet Daily for 7 days then daily PRN for gout flare   cyclobenzaprine 10 MG tablet Commonly known as: FLEXERIL Take 10 mg by mouth 3 (three) times daily as needed for muscle spasms.   diclofenac Sodium 1 % Gel Commonly known as: VOLTAREN Apply 2 g topically daily as needed for pain.   empagliflozin 10 MG Tabs tablet Commonly known as: Jardiance Take 1 tablet (10 mg total) by mouth daily before breakfast.   Entresto 24-26 MG Generic drug: sacubitril-valsartan Take 1 tablet by mouth 2 (two) times daily.   ferrous sulfate 325 (65 FE) MG tablet Take 1 tablet (325 mg total) by mouth every other day. What changed: when to take this   FreeStyle Libre [place] 1 each by Does not apply route as needed.   FreeStyle Libre 14 Day Sensor Misc Use one applicator every 14 (fourteen) days.   furosemide 40 MG tablet Commonly known as: LASIX Take 1.5 tablets (60 mg total) by mouth 2 (two) times daily. What changed: See the new instructions.   gabapentin 800 MG tablet Commonly known as: NEURONTIN Take 1 tablet (800 mg total) by mouth every 6 (six) hours.   glipiZIDE 10 MG tablet Commonly known as: GLUCOTROL Take 1 tablet (10 mg total) by mouth 2 (two) times daily  before a meal.   insulin glargine 100 UNIT/ML injection Commonly known as: LANTUS Inject 0.2 mLs (20 Units total) into the skin daily.   Insulin Lispro Prot & Lispro (75-25) 100 UNIT/ML Kwikpen Commonly known as: HumaLOG Mix 75/25 KwikPen Inject 30 Units into the skin 2 (two) times daily.   Insulin Syringe-Needle U-100 31G X 5/16" 0.3 ML Misc Commonly known as: TRUEplus Insulin Syringe USE 3 TIMES DAILY TO INJECT INSULIN   metFORMIN 500 MG tablet Commonly known as: GLUCOPHAGE Take 1 tablet (500 mg total) by mouth 2 (two) times daily with a meal.   Oxycodone HCl 10 MG Tabs Take 10 mg by mouth 3 (three) times daily.   Ozempic (2 MG/DOSE) 8 MG/3ML Sopn Generic drug: Semaglutide (2 MG/DOSE) Inject 2 mg into the skin once a week. Monday   pantoprazole 40 MG tablet Commonly known as: PROTONIX Take 1 tablet (40 mg total) by mouth daily.   potassium chloride SA 20 MEQ tablet Commonly known as: KLOR-CON M Take 2 tablets (40 mEq total) by mouth daily.   predniSONE 20 MG tablet Commonly known as: DELTASONE Take 2 tablets (40 mg  total) by mouth daily with breakfast for 7 days.   QUEtiapine 300 MG tablet Commonly known as: SEROquel Take 1.5 tablets (450 mg total) by mouth at bedtime.   spironolactone 25 MG tablet Commonly known as: ALDACTONE Take 0.5 tablets (12.5 mg total) by mouth at bedtime.   tadalafil 20 MG tablet Commonly known as: CIALIS Take 20 mg by mouth daily as needed for erectile dysfunction.   TRUEplus Pen Needles 31G X 6 MM Misc Generic drug: Insulin Pen Needle USE AS DIRECTED        Follow-up Information     Bo Merino I, NP. Schedule an appointment as soon as possible for a visit in 1 week(s).   Specialty: Nurse Practitioner Contact information: [address] [place], [place] [address] [phone]         [name], MD .   Specialty: Cardiology Contact information: [phone] [address]  [address] [phone]         [name], MD .   Specialty: Cardiology Contact information: [place] [address] [phone]         [name], MD .   Specialties: Cardiology, Radiology Contact information: [place] [address] [phone]                No Known Allergies  Consultations: None   Procedures/Studies: CT TIBIA FIBULA RIGHT W CONTRAST  Result Date: 06/20/2022 CLINICAL DATA:  Right lower extremity cellulitis with pain and swelling for 3 weeks, no response to doxycycline. EXAM: CT OF THE RIGHT TIBIA FIBULA WITH CONTRAST; CT OF THE RIGHT ANKLE WITH CONTRAST; CT OF THE RIGHT FOOT WITH CONTRAST TECHNIQUE: Multidetector CT imaging of the right lower extremity was performed from the knee joint through the foot following the standard protocol during bolus administration of intravenous contrast. RADIATION DOSE REDUCTION: This exam was performed according to the departmental dose-optimization program which includes automated exposure control, adjustment of the mA and/or kV according to patient size and/or use of iterative reconstruction technique. CONTRAST:  111m OMNIPAQUE IOHEXOL 300 MG/ML  SOLN COMPARISON:  None Available. FINDINGS: Bones/Joint/Cartilage No destructive bone lesion or aggressive periosteal reaction is seen from the distal femoral shaft through the foot. There is only trace suprapatellar bursal fluid which could be physiologic. 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 There is enthesopathic spurring of the anterior aspect of the patella and the plantar aspect of the calcaneus, with additional nonerosive arthrosis of the tibiotalar joint, talonavicular joint, first  MTP joint and toe joints and with hammertoe deformities of the second through fifth toes. The bones are normally mineralized. Evidence of fractures is not seen. There is a normal variant fabella posterior to the lateral femoral condyle. There is no popliteal cyst. Ligaments Suboptimally assessed by CT. Muscles and Tendons Area tendons are not optimally evaluated with CT but are grossly intact. There is normal muscle bulk in the distal thigh. In the foreleg there is normal muscle bulk with the exception of mild fatty atrophy in the gastrocnemius. In the foot there is fatty atrophy in the intrinsic plantar musculature, common in diabetics. No intramuscular fluid collection is seen and no intermuscular edema or fascial thickening is evident. There is no soft tissue gas. Soft tissues There is stranding  edema anteriorly in the distal thigh extending over the anterior knee. No edema is seen inferior to this in proximal foreleg, but superficial edema is noted moderately in the mid to distal foreleg and foot, without visible underlying abscess or localized fluid collection. There is scattered nonlocalizing fluid in the medial distal foreleg and dorsal foot. No significant edema visible in the deep soft tissues. There is scattered calcification in the distal femoral artery order and popliteal trifurcation arteries. There are a few subcutaneous venous varicosities in the medial foreleg IMPRESSION: 1. Superficial edema without localizing abscess or underlying destructive bone lesion. 2. No visible significant edema in the deep soft tissues or appreciable fascial thickening. There is no soft tissue gas. Lack of overt findings of necrotizing fasciitis on CT however, should not delay surgical exploration if there is strong clinical evidence of the disease. 3. Mild fatty atrophy in the gastrocnemius, with fatty atrophy in the intrinsic plantar foot musculature. 4. Degenerative and enthesopathic changes. 5. Calcifications in the  popliteal trifurcation arteries. Electronically Signed   By: Telford Nab M.D.   On: 06/20/2022 21:49   CT ANKLE RIGHT W CONTRAST  Result Date: 06/20/2022 CLINICAL DATA:  Right lower extremity cellulitis with pain and swelling for 3 weeks, no response to doxycycline. EXAM: CT OF THE RIGHT TIBIA FIBULA WITH CONTRAST; CT OF THE RIGHT ANKLE WITH CONTRAST; CT OF THE RIGHT FOOT WITH CONTRAST TECHNIQUE: Multidetector CT imaging of the right lower extremity was performed from the knee joint through the foot following the standard protocol during bolus administration of intravenous contrast. RADIATION DOSE REDUCTION: This exam was performed according to the departmental dose-optimization program which includes automated exposure control, adjustment of the mA and/or kV according to patient size and/or use of iterative reconstruction technique. CONTRAST:  114m OMNIPAQUE IOHEXOL 300 MG/ML  SOLN COMPARISON:  None Available. FINDINGS: Bones/Joint/Cartilage No destructive bone lesion or aggressive periosteal reaction is seen from the distal femoral shaft through the foot. There is only trace suprapatellar bursal fluid which could be physiologic. 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 There is enthesopathic spurring of the anterior aspect of the patella and the plantar aspect of the calcaneus, with additional nonerosive arthrosis of the tibiotalar joint, talonavicular joint, first MTP joint and toe joints and with hammertoe deformities of the second through fifth toes. The bones are normally mineralized. Evidence of fractures is not seen. There is a normal variant fabella posterior to the lateral femoral condyle. There is no popliteal cyst. Ligaments Suboptimally assessed by CT. Muscles  and Tendons Area tendons are not optimally evaluated with CT but are grossly intact. There is normal muscle bulk in the distal thigh. In the foreleg there is normal muscle bulk with the exception of mild fatty atrophy in the gastrocnemius. In the foot there is fatty atrophy in the intrinsic plantar musculature, common in diabetics. No intramuscular fluid collection is seen and no intermuscular edema or fascial thickening is evident. There is no soft tissue gas. Soft tissues There is stranding edema anteriorly in the distal thigh extending over the anterior knee. No edema is seen inferior to this in proximal foreleg, but superficial edema is noted moderately in the mid to distal foreleg and foot, without visible underlying abscess or localized fluid collection. There is scattered nonlocalizing fluid in the medial distal foreleg and dorsal  foot. No significant edema visible in the deep soft tissues. There is scattered calcification in the distal femoral artery order and popliteal trifurcation arteries. There are a few subcutaneous venous varicosities in the medial foreleg IMPRESSION: 1. Superficial edema without localizing abscess or underlying destructive bone lesion. 2. No visible significant edema in the deep soft tissues or appreciable fascial thickening. There is no soft tissue gas. Lack of overt findings of necrotizing fasciitis on CT however, should not delay surgical exploration if there is strong clinical evidence of the disease. 3. Mild fatty atrophy in the gastrocnemius, with fatty atrophy in the intrinsic plantar foot musculature. 4. Degenerative and enthesopathic changes. 5. Calcifications in the popliteal trifurcation arteries. Electronically Signed   By: Telford Nab M.D.   On: 06/20/2022 21:49   CT FOOT RIGHT W CONTRAST  Result Date: 06/20/2022 CLINICAL DATA:  Right lower extremity cellulitis with pain and swelling for 3 weeks, no response to doxycycline. EXAM: CT OF THE RIGHT TIBIA FIBULA WITH  CONTRAST; CT OF THE RIGHT ANKLE WITH CONTRAST; CT OF THE RIGHT FOOT WITH CONTRAST TECHNIQUE: Multidetector CT imaging of the right lower extremity was performed from the knee joint through the foot following the standard protocol during bolus administration of intravenous contrast. RADIATION DOSE REDUCTION: This exam was performed according to the departmental dose-optimization program which includes automated exposure control, adjustment of the mA and/or kV according to patient size and/or use of iterative reconstruction technique. CONTRAST:  15m OMNIPAQUE IOHEXOL 300 MG/ML  SOLN COMPARISON:  None Available. FINDINGS: Bones/Joint/Cartilage No destructive bone lesion or aggressive periosteal reaction is seen from the distal femoral shaft through the foot. There is only trace suprapatellar bursal fluid which could be physiologic. 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 There is enthesopathic spurring of the anterior aspect of the patella and the plantar aspect of the calcaneus, with additional nonerosive arthrosis of the tibiotalar joint, talonavicular joint, first MTP joint and toe joints and with hammertoe deformities of the second through fifth toes. The bones are normally mineralized. Evidence of fractures is not seen. There is a normal variant fabella posterior to the lateral femoral condyle. There is no popliteal cyst. Ligaments Suboptimally assessed by CT. Muscles and Tendons Area tendons are not optimally evaluated with CT but are grossly intact. There is normal muscle bulk in the distal thigh. In the foreleg there is normal muscle bulk with the exception of mild fatty atrophy in the gastrocnemius. In the foot there is fatty atrophy in the intrinsic plantar musculature,  common in diabetics. No intramuscular fluid collection is seen and no intermuscular edema or fascial thickening is evident. There is no soft tissue gas. Soft tissues There is stranding edema anteriorly in the distal thigh extending over the anterior knee. No edema is seen inferior to this in proximal foreleg, but superficial edema is noted moderately in the mid to distal foreleg and foot, without visible underlying abscess or localized fluid collection. There is scattered nonlocalizing fluid in the medial distal foreleg and dorsal foot. No significant edema visible in the deep soft tissues. There is scattered calcification in the distal femoral artery order and popliteal trifurcation arteries. There are a few subcutaneous venous varicosities in the medial foreleg IMPRESSION: 1. Superficial edema without localizing abscess or underlying destructive bone lesion. 2. No visible significant edema in the deep  soft tissues or appreciable fascial thickening. There is no soft tissue gas. Lack of overt findings of necrotizing fasciitis on CT however, should not delay surgical exploration if there is strong clinical evidence of the disease. 3. Mild fatty atrophy in the gastrocnemius, with fatty atrophy in the intrinsic plantar foot musculature. 4. Degenerative and enthesopathic changes. 5. Calcifications in the popliteal trifurcation arteries. Electronically Signed   By: Telford Nab M.D.   On: 06/20/2022 21:49   CUP PACEART REMOTE DEVICE CHECK  Result Date: 06/03/2022 Scheduled remote reviewed. Normal device function.  Next remote 91 days. Kathy Breach, RN, CCDS, CV Remote SolutionsNo anomalies detected.     Subjective: Patient seen and examined at bedside.  His right foot pain has much improved and he feels ready to go home today.  No overnight fever, vomiting, worsening shortness of breath reported.  Discharge Exam: Vitals:   06/23/22 2036 06/24/22 0456  BP: 138/75 112/68  Pulse: 75 62  Resp: 18 18  Temp:  97.6 F (36.4 C) 97.7 F (36.5 C)  SpO2: 97% 98%    General: Pt is alert, awake, not in acute distress.  Currently on room air. Cardiovascular: rate controlled, S1/S2 + Respiratory: bilateral decreased breath sounds at bases with basilar crackles Abdominal: Soft, morbidly obese, NT, ND, bowel sounds + Extremities: Mild lower extremity edema present bilaterally; no cyanosis.  Right lower extremity erythema and tenderness has much improved.    The results of significant diagnostics from this hospitalization (including imaging, microbiology, ancillary and laboratory) are listed below for reference.     Microbiology: Recent Results (from the past 240 hour(s))  MRSA Next Gen by PCR, Nasal     Status: None   Collection Time: 06/21/22  2:13 AM   Specimen: Nasal Mucosa; Nasal Swab  Result Value Ref Range Status   MRSA by PCR Next Gen NOT DETECTED NOT DETECTED Final    Comment: (NOTE) The GeneXpert MRSA Assay (FDA approved for NASAL specimens only), is one component of a comprehensive MRSA colonization surveillance program. It is not intended to diagnose MRSA infection nor to guide or monitor treatment for MRSA infections. Test performance is not FDA approved in patients less than 9 years old. Performed at [facility], [place] [address], [place],  [address]      Labs: BNP (last 3 results) Recent Labs    06/29/21 0240 06/29/21 1654 07/20/21 1516  BNP 246.0* 349.4* 973.5*   Basic Metabolic Panel: Recent Labs  Lab 06/20/22 1816 06/21/22 0438 06/23/22 0427  NA 137 137 142  K 3.8 3.4* 4.1  CL 103 108 109  CO2 21* 23 26  GLUCOSE 87 155* 152*  BUN '18 17 16  ' CREATININE 1.19 0.90 0.85  CALCIUM 9.8 8.4* 8.6*  MG  --   --  2.2   Liver Function Tests: Recent Labs  Lab 06/20/22 1816  AST 15  ALT 13  ALKPHOS 74  BILITOT 0.4  PROT 8.8*  ALBUMIN 4.2   No results for input(s): "LIPASE", "AMYLASE" in the last 168 hours. No results for input(s):  "AMMONIA" in the last 168 hours. CBC: Recent Labs  Lab 06/20/22 1816 06/21/22 0438  WBC 10.9* 8.9  NEUTROABS 8.2*  --   HGB 9.9* 9.3*  HCT 35.7* 33.5*  MCV 71.1* 71.1*  PLT 475* 421*   Cardiac Enzymes: No results for input(s): "CKTOTAL", "CKMB", "CKMBINDEX", "TROPONINI" in the last 168 hours. BNP: Invalid input(s): "POCBNP" CBG: Recent Labs  Lab 06/23/22 0725 06/23/22 1151  06/23/22 1639 06/23/22 2039 06/24/22 0737  GLUCAP 112* 183* 259* 251* 158*   D-Dimer No results for input(s): "DDIMER" in the last 72 hours. Hgb A1c No results for input(s): "HGBA1C" in the last 72 hours. Lipid Profile No results for input(s): "CHOL", "HDL", "LDLCALC", "TRIG", "CHOLHDL", "LDLDIRECT" in the last 72 hours. Thyroid function studies No results for input(s): "TSH", "T4TOTAL", "T3FREE", "THYROIDAB" in the last 72 hours.  Invalid input(s): "FREET3" Anemia work up No results for input(s): "VITAMINB12", "FOLATE", "FERRITIN", "TIBC", "IRON", "RETICCTPCT" in the last 72 hours. Urinalysis    Component Value Date/Time   COLORURINE STRAW (A) 01/26/2021 1132   APPEARANCEUR CLEAR 01/26/2021 1132   LABSPEC 1.006 01/26/2021 1132   PHURINE 5.0 01/26/2021 1132   GLUCOSEU NEGATIVE 01/26/2021 1132   HGBUR SMALL (A) 01/26/2021 1132   BILIRUBINUR negative 05/05/2022 1245   BILIRUBINUR neg 04/01/2021 1403   KETONESUR negative 05/05/2022 1245   KETONESUR NEGATIVE 01/26/2021 1132   PROTEINUR Negative 04/01/2021 1403   PROTEINUR 30 (A) 01/26/2021 1132   UROBILINOGEN 0.2 05/05/2022 1245   UROBILINOGEN 1.0 06/10/2010 2207   NITRITE Negative 05/05/2022 1245   NITRITE neg 04/01/2021 1403   NITRITE NEGATIVE 01/26/2021 1132   LEUKOCYTESUR Negative 05/05/2022 1245   LEUKOCYTESUR NEGATIVE 01/26/2021 1132   Sepsis Labs Recent Labs  Lab 06/20/22 1816 06/21/22 0438  WBC 10.9* 8.9   Microbiology Recent Results (from the past 240 hour(s))  MRSA Next Gen by PCR, Nasal     Status: None   Collection  Time: 06/21/22  2:13 AM   Specimen: Nasal Mucosa; Nasal Swab  Result Value Ref Range Status   MRSA by PCR Next Gen NOT DETECTED NOT DETECTED Final    Comment: (NOTE) The GeneXpert MRSA Assay (FDA approved for NASAL specimens only), is one component of a comprehensive MRSA colonization surveillance program. It is not intended to diagnose MRSA infection nor to guide or monitor treatment for MRSA infections. Test performance is not FDA approved in patients less than 106 years old. Performed at [facility], [place] [address], [place], [place] [address]      Time coordinating discharge: 35 minutes  SIGNED:   Aline August, MD  [facility] 06/24/2022, 10:07 AM

## 2022-06-26 ENCOUNTER — Telehealth: Payer: Self-pay | Admitting: Nurse Practitioner

## 2022-06-26 ENCOUNTER — Other Ambulatory Visit: Payer: Self-pay | Admitting: *Deleted

## 2022-06-26 NOTE — Telephone Encounter (Signed)
Pt was seen in ED last week. Colchicine was helping, but is out of meds and needs more. Coming for TOC appt on 8/3 w/Tonya. Requesting colchicine be sent to [facility] on [address]

## 2022-06-26 NOTE — Patient Outreach (Addendum)
  Care Coordination TOC Note Transition Care Management Follow-up Telephone Call Date of discharge and from where: [name] [id number] How have you been since you were released from the hospital? Having pain and need the gout medication Any questions or concerns?Patient was able to get his colchicine . He stated the pharmacy said they would contact the Dr. Shelly Bombard called Pharmacy and  they stated the carvedilol increases the strength of Colchicine and they want to make sure Dr aware and dose. RN made patient aware so he could understand.   Items Reviewed: Did the pt receive and understand the discharge instructions provided? Yes  Medications obtained and verified? Yes  Other? No  Any new allergies since your discharge? No  Dietary orders reviewed? No Do you have support at home? Yes   Home Care and Equipment/Supplies: Were home health services ordered? no If so, what is the name of the agency? N   Has the agency set up a time to come to the patient's home? not applicable Were any new equipment or medical supplies ordered?  N  What is the name of the medical supply agency? n Were you able to get the supplies/equipment? not applicable Do you have any questions related to the use of the equipment or supplies? No  Functional Questionnaire: (I = Independent and D = Dependent) ADLs: I  Bathing/Dressing- I  Meal Prep- I  Eating- I  Maintaining continence- I  Transferring/Ambulation- I  Managing Meds- I  Follow up appointments reviewed:  PCP Hospital f/u appt confirmed? No   Specialist Hospital f/u appt confirmed? No   Are transportation arrangements needed? No  If their condition worsens, is the pt aware to call PCP or go to the Emergency Dept.? Y Was the patient provided with contact information for the PCP's office or ED? Yes Was to pt encouraged to call back with questions or concerns? Y   SDOH assessments and interventions completed:   Yes  Care Coordination  Interventions Activated:  Yes Care Coordination Interventions:  PCP follow up appointment requested  Encounter Outcome:  Pt. Visit Completed

## 2022-06-27 ENCOUNTER — Telehealth: Payer: Self-pay

## 2022-06-27 ENCOUNTER — Other Ambulatory Visit (HOSPITAL_COMMUNITY): Payer: Self-pay

## 2022-06-27 ENCOUNTER — Other Ambulatory Visit: Payer: Self-pay | Admitting: Nurse Practitioner

## 2022-06-27 ENCOUNTER — Telehealth (HOSPITAL_COMMUNITY): Payer: Self-pay

## 2022-06-27 NOTE — Telephone Encounter (Signed)
[facility] alert received for [facility]. Known history of PAF, intolerant to [place] secondary to GI bleed. Watchman placed 10/27/21. AF burden the last 24 hours 67.9% with mean V rate 139. Of note patient recently prescribed prednisone dose pack for gout.   Contacted Healther Spencer, EMT-paramedicine as she has an appointment for a home visit today at 2:00 pm. Discussed alert with EMT who will assess patient and confirm he is taking his coreg as prescribed. She will contact device clinic with additional information and symptoms if needed at [phone]. Otherwise we will continue to monitor via nightly transmissions. Routing to Dr. Quentin Ore as Juluis Rainier

## 2022-06-27 NOTE — Progress Notes (Signed)
Paramedicine Encounter    Patient ID: [name], male    DOB: 01/09/1961, 61 y.o.   MRN: [id number]   Arrived for home visit for [name] who reports he is feeling much better. He is ambulating easier and appears to not be in as much pain as last week. Swelling much improved.   Home scale reading 310.  Last weight 334lbs  I advised him of the findings from the device clinic pertaining to his ICD and high heart rate last night. He reports waking up in a sweat at some point but doesn't remember what time but denied chest pain, dizziness or fluttering in his chest. He has not missed any doses of his meds once he was sent home on Saturday.   I reviewed meds and confirmed same. Pill box filled for one week.  Refills as noted: -Pantoprazole -Metformin -Jardiance -Freestyle Sensors   -Ozempic refills needed/  I will message Rebbeca Paul (pharmacist)   I plan to see [name] in one week, he agreed. Next appointments confirmed and wrote down for him.   [name], [place] 06/27/2022    Patient Care Team: Teena Dunk, NP as PCP - General (Nurse Practitioner) [name], MD as PCP - Cardiology (Cardiology) [name], MD as PCP - Advanced Heart Failure (Cardiology) [name], MD as PCP - Electrophysiology (Cardiology) [name], LCSW as Social Worker (Licensed Clinical Social Worker) [name], MD as Consulting Physician (Gastroenterology)  Patient Active Problem List   Diagnosis Date Noted   Cellulitis 06/22/2022   Cellulitis, leg 06/20/2022   Presence of Watchman left atrial appendage closure device 10/28/2021   Atrial fibrillation ([place]) 10/27/2021   [place]    GI bleed 06/29/2021   Severe anemia 06/29/2021   Adenomatous polyp of ascending colon    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   Acute blood loss anemia    Angiodysplasia of stomach    Chronic anticoagulation    CHF  exacerbation ([place]) 06/15/2020   AKI (acute kidney injury) ([name]) 06/15/2020   CKD (chronic kidney disease), stage III ([place]) 06/15/2020   Anemia due to chronic blood loss    Gastric AVM    Angiodysplasia of duodenum with hemorrhage    Chronic systolic CHF (congestive heart failure) ([place]) 04/05/2020   Anemia 04/05/2020   PAF (paroxysmal atrial fibrillation) ([place]) 04/05/2020   OSA on CPAP 04/05/2020   Syncope and collapse 04/05/2020   Type 2 diabetes mellitus with hyperglycemia, with long-term current use of insulin ([place]) 12/03/2019   Diabetic polyneuropathy associated with type 2 diabetes mellitus ([place]) 12/03/2019   Erectile dysfunction 12/03/2019   Paranoid schizophrenia, chronic condition ([place]) 01/26/2015   Severe recurrent major depressive disorder with psychotic features ([place]) 01/26/2015   GAD (generalized anxiety disorder) 01/26/2015   OCD (obsessive compulsive disorder) 01/26/2015   Panic disorder without agoraphobia 01/26/2015   Insomnia 01/26/2015    Current Outpatient Medications:    acetaminophen (TYLENOL) 500 MG tablet, Take 2 tablets (1,000 mg total) by mouth 2 (two) times daily as needed. (Patient taking differently: Take 1,000 mg by mouth 2 (two) times daily as needed for mild pain.), Disp: 180 tablet, Rfl: 3   albuterol (VENTOLIN HFA) 108 (90 Base) MCG/ACT inhaler, Inhale 2 puffs into the lungs every 6 (six) hours as needed for wheezing or shortness of breath., Disp: 18 g, Rfl: 1   aspirin EC 81 MG tablet, Take 1 tablet (81 mg total) by mouth daily. Swallow whole., Disp: 90  tablet, Rfl: 3   atorvastatin (LIPITOR) 40 MG tablet, Take 1 tablet (40 mg total) by mouth daily at 6 PM., Disp: 90 tablet, Rfl: 1   blood glucose meter kit and supplies KIT, Dispense based on patient and insurance preference. Use up to four times daily as directed. (FOR ICD-9 250.00, 250.01)., Disp: 1 each, Rfl: 0   carvedilol (COREG) 6.25 MG tablet, Take 1 tablet (6.25 mg total) by mouth 2 (two) times  daily with a meal., Disp: 60 tablet, Rfl: 6   cephALEXin (KEFLEX) 500 MG capsule, Take 1 capsule (500 mg total) by mouth 3 (three) times daily for 4 days., Disp: 12 capsule, Rfl: 0   colchicine 0.6 MG tablet, Daily for 7 days then daily PRN for gout flare, Disp: 30 tablet, Rfl: 0   Continuous Blood Gluc Receiver (FREESTYLE LIBRE 14 DAY READER) DEVI, 1 each by Does not apply route as needed., Disp: 1 each, Rfl: 11   Continuous Blood Gluc Sensor (FREESTYLE LIBRE 14 DAY SENSOR) MISC, Use one applicator every 14 (fourteen) days., Disp: 6 each, Rfl: 3   cyclobenzaprine (FLEXERIL) 10 MG tablet, Take 10 mg by mouth 3 (three) times daily as needed for muscle spasms., Disp: , Rfl:    diclofenac Sodium (VOLTAREN) 1 % GEL, Apply 2 g topically daily as needed for pain., Disp: , Rfl:    empagliflozin (JARDIANCE) 10 MG TABS tablet, Take 1 tablet (10 mg total) by mouth daily before breakfast., Disp: 30 tablet, Rfl: 6   ferrous sulfate 325 (65 FE) MG tablet, Take 1 tablet (325 mg total) by mouth every other day. (Patient taking differently: Take 325 mg by mouth every Monday, Wednesday, and Friday.), Disp: 30 tablet, Rfl: 1   furosemide (LASIX) 40 MG tablet, Take 1.5 tablets (60 mg total) by mouth 2 (two) times daily., Disp: 90 tablet, Rfl: 3   gabapentin (NEURONTIN) 800 MG tablet, Take 1 tablet (800 mg total) by mouth every 6 (six) hours., Disp: 120 tablet, Rfl: 5   glipiZIDE (GLUCOTROL) 10 MG tablet, Take 1 tablet (10 mg total) by mouth 2 (two) times daily before a meal., Disp: 180 tablet, Rfl: 1   insulin glargine (LANTUS) 100 UNIT/ML injection, Inject 0.2 mLs (20 Units total) into the skin daily., Disp: 20 mL, Rfl: 1   Insulin Lispro Prot & Lispro (HUMALOG MIX 75/25 KWIKPEN) (75-25) 100 UNIT/ML Kwikpen, Inject 30 Units into the skin 2 (two) times daily., Disp: 54 mL, Rfl: 1   Insulin Syringe-Needle U-100 (TRUEPLUS INSULIN SYRINGE) 31G X 5/16" 0.3 ML MISC, USE 3 TIMES DAILY TO INJECT INSULIN, Disp: 300 each, Rfl:  3   metFORMIN (GLUCOPHAGE) 500 MG tablet, Take 1 tablet (500 mg total) by mouth 2 (two) times daily with a meal., Disp: 180 tablet, Rfl: 1   Multiple Vitamins-Minerals (CENTRUM SILVER 50+MEN) TABS, Take 1 tablet by mouth daily., Disp: , Rfl:    Oxycodone HCl 10 MG TABS, Take 10 mg by mouth 3 (three) times daily., Disp: , Rfl:    pantoprazole (PROTONIX) 40 MG tablet, Take 1 tablet (40 mg total) by mouth daily., Disp: 90 tablet, Rfl: 1   potassium chloride SA (KLOR-CON M) 20 MEQ tablet, Take 2 tablets (40 mEq total) by mouth daily., Disp: 60 tablet, Rfl: 6   predniSONE (DELTASONE) 20 MG tablet, Take 2 tablets (40 mg total) by mouth daily with breakfast for 7 days., Disp: 14 tablet, Rfl: 0   QUEtiapine (SEROQUEL) 300 MG tablet, Take 1.5 tablets (450 mg total) by mouth  at bedtime., Disp: 135 tablet, Rfl: 1   sacubitril-valsartan (ENTRESTO) 24-26 MG, Take 1 tablet by mouth 2 (two) times daily., Disp: 180 tablet, Rfl: 3   Semaglutide, 2 MG/DOSE, (OZEMPIC, 2 MG/DOSE,) 8 MG/3ML SOPN, Inject 2 mg into the skin once a week. Monday, Disp: , Rfl:    spironolactone (ALDACTONE) 25 MG tablet, Take 0.5 tablets (12.5 mg total) by mouth at bedtime., Disp: 45 tablet, Rfl: 3   tadalafil (CIALIS) 20 MG tablet, Take 20 mg by mouth daily as needed for erectile dysfunction., Disp: , Rfl:    TRUEPLUS PEN NEEDLES 31G X 6 MM MISC, USE AS DIRECTED, Disp: 100 each, Rfl: 0 No Known Allergies   Social History   Socioeconomic History   Marital status: Legally Separated    Spouse name: Not on file   Number of children: Not on file   Years of education: Not on file   Highest education level: Not on file  Occupational History   Not on file  Tobacco Use   Smoking status: Former    Passive exposure: Never   Smokeless tobacco: Never  Vaping Use   Vaping Use: Never used  Substance and Sexual Activity   Alcohol use: Not Currently    Alcohol/week: 2.0 standard drinks of alcohol    Types: 2 Cans of beer per week   Drug  use: No   Sexual activity: Yes    Birth control/protection: None  Other Topics Concern   Not on file  Social History Narrative   Not on file   Social Determinants of Health   Financial Resource Strain: Low Risk  (12/23/2019)   Overall Financial Resource Strain (CARDIA)    Difficulty of Paying Living Expenses: Not very hard  Food Insecurity: No Food Insecurity (07/01/2021)   Hunger Vital Sign    Worried About Running Out of Food in the Last Year: Never true    Ran Out of Food in the Last Year: Never true  Transportation Needs: Unmet Transportation Needs (07/01/2021)   PRAPARE - Hydrologist (Medical): Yes    Lack of Transportation (Non-Medical): Yes  Physical Activity: Not on file  Stress: Not on file  Social Connections: Not on file  Intimate Partner Violence: Not on file    Physical Exam      Future Appointments  Date Time Provider [place]  06/29/2022  1:40 PM Fenton Foy, NP [place] None  08/07/2022 10:20 AM Fenton Foy, NP [facility] None  09/01/2022  7:40 AM [facility] DEVICE REMOTES [facility]  12/01/2022  7:40 AM [facility] DEVICE REMOTES [facility]  03/02/2023  7:40 AM [facility] DEVICE REMOTES [facility]  06/01/2023  7:40 AM [facility] DEVICE REMOTES [facility]     ACTION: Home visit completed

## 2022-06-27 NOTE — Telephone Encounter (Signed)
[name] reports he needing refills on his Ozempic which is filled by patient assitance which is facilitated by [name] office, [name].   I will forward this to her for follow up and how I can help with this.   [name], [place] 06/27/2022

## 2022-06-27 NOTE — Telephone Encounter (Signed)
Spoke to [place] this morning, he reports he is feeling better and is able to walk better since being discharged. He reports he is using the prednisone and the colchicine the prescribed and it is helping.   We agreed to a home visit today around 2:00. I will follow up then. Call complete.   [name], [place] 06/27/2022

## 2022-06-28 ENCOUNTER — Other Ambulatory Visit: Payer: Self-pay | Admitting: Nurse Practitioner

## 2022-06-28 ENCOUNTER — Other Ambulatory Visit: Payer: Self-pay

## 2022-06-28 MED ORDER — METFORMIN HCL 500 MG PO TABS: 500.0000 mg | ORAL_TABLET | Freq: Two times a day (BID) | ORAL | 2 refills | Status: DC

## 2022-06-28 MED ORDER — COLCHICINE 0.6 MG PO TABS: ORAL_TABLET | ORAL | 0 refills | Status: DC

## 2022-06-28 NOTE — Telephone Encounter (Signed)
Refill sent to pharmacy. Thanks.

## 2022-06-29 ENCOUNTER — Ambulatory Visit (INDEPENDENT_AMBULATORY_CARE_PROVIDER_SITE_OTHER): Payer: Medicare Other | Admitting: Nurse Practitioner

## 2022-06-29 ENCOUNTER — Encounter: Payer: Self-pay | Admitting: Nurse Practitioner

## 2022-06-29 VITALS — BP 94/75 | HR 141 | Temp 97.8°F | Ht 74.0 in | Wt 310.0 lb

## 2022-06-29 DIAGNOSIS — G4730 Sleep apnea, unspecified: Secondary | ICD-10-CM | POA: Diagnosis not present

## 2022-06-29 DIAGNOSIS — I5032 Chronic diastolic (congestive) heart failure: Secondary | ICD-10-CM

## 2022-06-29 DIAGNOSIS — I480 Paroxysmal atrial fibrillation: Secondary | ICD-10-CM | POA: Diagnosis not present

## 2022-06-29 DIAGNOSIS — E1165 Type 2 diabetes mellitus with hyperglycemia: Secondary | ICD-10-CM

## 2022-06-29 DIAGNOSIS — L03115 Cellulitis of right lower limb: Secondary | ICD-10-CM

## 2022-06-29 DIAGNOSIS — Z09 Encounter for follow-up examination after completed treatment for conditions other than malignant neoplasm: Secondary | ICD-10-CM

## 2022-06-29 DIAGNOSIS — R000 Tachycardia, unspecified: Secondary | ICD-10-CM

## 2022-06-29 DIAGNOSIS — Z794 Long term (current) use of insulin: Secondary | ICD-10-CM

## 2022-06-29 MED ORDER — MUPIROCIN 2 % EX OINT: 1.0000 | TOPICAL_OINTMENT | Freq: Two times a day (BID) | CUTANEOUS | 0 refills | Status: DC

## 2022-06-29 NOTE — Telephone Encounter (Signed)
Patients enrollment in pt assistance has ended. Nira Conn can you help patient fill out application and see if he can proof of income for 2023?   Here is the link. He can apply online or by paper  [email or web address]

## 2022-06-29 NOTE — Patient Instructions (Addendum)
1. Chronic diastolic CHF (congestive heart failure) (HCC)  - CBC - Basic Metabolic Panel - Ambulatory referral to Cardiology  2. PAF (paroxysmal atrial fibrillation) (HCC)  - CBC - Basic Metabolic Panel - Ambulatory referral to Cardiology  3. Sleep apnea, unspecified type  - Ambulatory referral to Pulmonology  4. Cellulitis of right lower extremity  -improved  - mupirocin ointment (BACTROBAN) 2 %; Apply 1 Application topically 2 (two) times daily.  Dispense: 22 g; Refill: 0  5. Type 2 diabetes mellitus with hyperglycemia, with long-term current use of insulin (HCC)  -continue current medications  6. Hospital discharge follow-up  - Ambulatory referral to Cardiology  7. Tachycardia  - Ambulatory referral to Cardiology

## 2022-06-29 NOTE — Progress Notes (Signed)
'@Patient'  ID: [name], male    DOB: [birth year], 61 y.o.   MRN: [id number]  Chief Complaint  Patient presents with   Hospitalization Follow-up    Pt is here for hospital follow up    Referring provider: Bo Long I, NP  Recent significant events:  Admit date: 06/20/2022 Discharge date: 06/24/2022  Discharge Diagnoses:    Right lower extremity cellulitis with failed outpatient doxycycline treatment -CT as above. -Currently on IV Ancef.  Cellulitis has significantly improved.  -Discharge home on oral Keflex for few more days.   Possible acute gout -Treated with IV Solu-Medrol and oral colchicine.  Lower extremity pain has much improved. -Discharge home on oral prednisone 40 mg daily for 7 days, colchicine 0.6 mg daily for 7 days then as needed for acute gout flare.  PCP to decide if patient needs to be on allopurinol.     Iron deficiency anemia -hemoglobin stable.  Monitor intermittently.  Treated with IV iron.  Continue oral iron supplementation on discharge. -Might benefit from outpatient GI evaluation   Paroxysmal A-fib with history of watchman's procedure -Rate controlled.  Continue Coreg.  Not on anticoagulation as an outpatient because of history of GI bleed.  Outpatient follow-up with cardiology   Chronic systolic heart failure with history of AICD placement -Continue Coreg, Lasix, Entresto.  Outpatient follow-up with cardiology.  Continue diet and and fluid restriction.     OSA on CPAP -Continue CPAP   Morbid obesity -Outpatient follow-up   Diabetes mellitus type 2 with hyperglycemia -Resume home regimen.  Carb modified diet.  Schizophrenia -Continue quetiapine at bedtime.  Outpatient follow-up with PCP/psychiatry  Chronic pain -Continue current regimen.  Outpatient follow-up with PCP/pain management   CKD III has been ruled out  HPI  [name] 61 y.o. male  has a past medical history of AICD (automatic  cardioverter/defibrillator) present, Anxiety, Atrial fibrillation ([place]), CAD (coronary artery disease), Cardiomyopathy ([place]), CHF (congestive heart failure) ([place]) (09/2019), Depression, Diabetes mellitus, Erectile dysfunction (11/2019), GI bleeding, H/O right heart catheterization (09/2019), Hypertension, Presence of permanent cardiac pacemaker, Presence of Watchman left atrial appendage closure device (10/27/2021), Schizophrenia ([place]), and Sleep apnea.   Patient presents today for hospital follow-up. See hospital note above. Overall patient is doing well. He is complaint with medications. Heart rate is elevated in office today. Patient does not have cardiology follow up scheduled. Will call to schedule appointment and let them know about tachycardia. Patient states that he has sleep apnea but does not have machine. Will refer to pulmonary. Will order repeat blood work today. Cellulitis much improved. Patient does have a small open area to right shin. Will order Bactroban ointment. Denies f/c/s, n/v/d, hemoptysis, PND, leg swelling Denies chest pain or edema    No Known Allergies  Immunization History  Administered Date(s) Administered   Influenza,inj,Quad PF,6+ Mos 10/13/2019, 09/13/2020, 08/03/2021   PFIZER(Purple Top)SARS-COV-2 Vaccination 03/13/2020   Pneumococcal Polysaccharide-23 10/16/2019   Tdap 09/13/2020   Zoster Recombinat (Shingrix) 08/03/2021    Past Medical History:  Diagnosis Date   AICD (automatic cardioverter/defibrillator) present    Anxiety    Atrial fibrillation (HCC)    CAD (coronary artery disease)    Cardiomyopathy ([place])    CHF (congestive heart failure) ([place]) 09/2019   Depression    Diabetes mellitus    Erectile dysfunction 11/2019   GI bleeding    H/O right heart catheterization 09/2019   Hypertension    Presence of permanent cardiac pacemaker    Presence of  Watchman left atrial appendage closure device 10/27/2021   27 mm Watchman Flex Device per Dr.  Quentin Ore   Schizophrenia [facility])    Sleep apnea    uses cpap    Tobacco History: Social History   Tobacco Use  Smoking Status Former   Passive exposure: Never  Smokeless Tobacco Never   Counseling given: Not Answered   Outpatient Encounter Medications as of 06/29/2022  Medication Sig   acetaminophen (TYLENOL) 500 MG tablet Take 2 tablets (1,000 mg total) by mouth 2 (two) times daily as needed. (Patient taking differently: Take 1,000 mg by mouth 2 (two) times daily as needed for mild pain.)   aspirin EC 81 MG tablet Take 1 tablet (81 mg total) by mouth daily. Swallow whole.   atorvastatin (LIPITOR) 40 MG tablet Take 1 tablet (40 mg total) by mouth daily at 6 PM.   blood glucose meter kit and supplies KIT Dispense based on patient and insurance preference. Use up to four times daily as directed. (FOR ICD-9 250.00, 250.01).   carvedilol (COREG) 6.25 MG tablet Take 1 tablet (6.25 mg total) by mouth 2 (two) times daily with a meal.   colchicine 0.6 MG tablet Daily for 7 days then daily PRN for gout flare   cyclobenzaprine (FLEXERIL) 10 MG tablet Take 10 mg by mouth 3 (three) times daily as needed for muscle spasms.   diclofenac Sodium (VOLTAREN) 1 % GEL Apply 2 g topically daily as needed for pain.   empagliflozin (JARDIANCE) 10 MG TABS tablet Take 1 tablet (10 mg total) by mouth daily before breakfast.   ferrous sulfate 325 (65 FE) MG tablet Take 1 tablet (325 mg total) by mouth every other day. (Patient taking differently: Take 325 mg by mouth every Monday, Wednesday, and Friday.)   furosemide (LASIX) 40 MG tablet Take 1.5 tablets (60 mg total) by mouth 2 (two) times daily.   gabapentin (NEURONTIN) 800 MG tablet Take 1 tablet (800 mg total) by mouth every 6 (six) hours.   glipiZIDE (GLUCOTROL) 10 MG tablet Take 1 tablet (10 mg total) by mouth 2 (two) times daily before a meal.   Insulin Lispro Prot & Lispro (HUMALOG MIX 75/25 KWIKPEN) (75-25) 100 UNIT/ML Kwikpen Inject 30 Units into the skin  2 (two) times daily.   Insulin Syringe-Needle U-100 (TRUEPLUS INSULIN SYRINGE) 31G X 5/16" 0.3 ML MISC USE 3 TIMES DAILY TO INJECT INSULIN   metFORMIN (GLUCOPHAGE) 500 MG tablet Take 1 tablet (500 mg total) by mouth 2 (two) times daily with a meal.   Multiple Vitamins-Minerals (CENTRUM SILVER 50+MEN) TABS Take 1 tablet by mouth daily.   mupirocin ointment (BACTROBAN) 2 % Apply 1 Application topically 2 (two) times daily.   Oxycodone HCl 10 MG TABS Take 10 mg by mouth 3 (three) times daily.   pantoprazole (PROTONIX) 40 MG tablet Take 1 tablet (40 mg total) by mouth daily.   potassium chloride SA (KLOR-CON M) 20 MEQ tablet Take 2 tablets (40 mEq total) by mouth daily.   QUEtiapine (SEROQUEL) 300 MG tablet Take 1.5 tablets (450 mg total) by mouth at bedtime.   sacubitril-valsartan (ENTRESTO) 24-26 MG Take 1 tablet by mouth 2 (two) times daily.   Semaglutide, 2 MG/DOSE, (OZEMPIC, 2 MG/DOSE,) 8 MG/3ML SOPN Inject 2 mg into the skin once a week. Monday   spironolactone (ALDACTONE) 25 MG tablet Take 0.5 tablets (12.5 mg total) by mouth at bedtime.   tadalafil (CIALIS) 20 MG tablet Take 20 mg by mouth daily as needed for erectile  dysfunction.   TRUEPLUS PEN NEEDLES 31G X 6 MM MISC USE AS DIRECTED   albuterol (VENTOLIN HFA) 108 (90 Base) MCG/ACT inhaler Inhale 2 puffs into the lungs every 6 (six) hours as needed for wheezing or shortness of breath. (Patient not taking: Reported on 06/29/2022)   Continuous Blood Gluc Receiver (FREESTYLE LIBRE 14 DAY READER) DEVI 1 each by Does not apply route as needed.   Continuous Blood Gluc Sensor (FREESTYLE LIBRE 14 DAY SENSOR) MISC Use one applicator every 14 (fourteen) days.   insulin glargine (LANTUS) 100 UNIT/ML injection Inject 0.2 mLs (20 Units total) into the skin daily.   predniSONE (DELTASONE) 20 MG tablet Take 2 tablets (40 mg total) by mouth daily with breakfast for 7 days. (Patient not taking: Reported on 06/29/2022)   No facility-administered encounter  medications on file as of 06/29/2022.     Review of Systems  Review of Systems  Constitutional: Negative.   HENT: Negative.    Cardiovascular: Negative.   Gastrointestinal: Negative.   Allergic/Immunologic: Negative.   Neurological: Negative.   Psychiatric/Behavioral: Negative.         Physical Exam  BP 94/75 (BP Location: Right Arm, Patient Position: Sitting, Cuff Size: Large)   Pulse (!) 141   Temp 97.8 F (36.6 C)   Ht '6\' 2"'  (1.88 m)   Wt (!) 310 lb (140.6 kg)   SpO2 99%   BMI 39.80 kg/m   Wt Readings from Last 5 Encounters:  06/29/22 (!) 310 lb (140.6 kg)  06/27/22 (!) 310 lb (140.6 kg)  06/24/22 (!) 334 lb 9.6 oz (151.8 kg)  06/20/22 (!) 318 lb (144.2 kg)  06/06/22 (!) 320 lb (145.2 kg)     Physical Exam Vitals and nursing note reviewed.  Constitutional:      General: He is not in acute distress.    Appearance: He is well-developed.  Cardiovascular:     Rate and Rhythm: Regular rhythm. Tachycardia present.  Pulmonary:     Effort: Pulmonary effort is normal.     Breath sounds: Normal breath sounds.  Skin:    General: Skin is warm and dry.     Comments: Small open area noted to right shin.   Neurological:     Mental Status: He is alert and oriented to person, place, and time.      Lab Results:  CBC    Component Value Date/Time   WBC 8.9 06/21/2022 0438   RBC 4.72 06/21/2022 0438   RBC 4.71 06/21/2022 0438   HGB 9.3 (L) 06/21/2022 0438   HGB 13.8 11/24/2021 1405   HCT 33.5 (L) 06/21/2022 0438   HCT 43.4 11/24/2021 1405   PLT 421 (H) 06/21/2022 0438   PLT 245 11/24/2021 1405   MCV 71.1 (L) 06/21/2022 0438   MCV 81 11/24/2021 1405   MCH 19.7 (L) 06/21/2022 0438   MCHC 27.8 (L) 06/21/2022 0438   RDW 18.1 (H) 06/21/2022 0438   RDW 14.6 11/24/2021 1405   LYMPHSABS 1.3 06/20/2022 1816   LYMPHSABS 1.2 09/01/2020 1020   MONOABS 1.1 (H) 06/20/2022 1816   EOSABS 0.2 06/20/2022 1816   EOSABS 0.2 09/01/2020 1020   BASOSABS 0.0 06/20/2022 1816    BASOSABS 0.0 09/01/2020 1020    BMET    Component Value Date/Time   NA 142 06/23/2022 0427   NA 131 (L) 11/24/2021 1405   K 4.1 06/23/2022 0427   CL 109 06/23/2022 0427   CO2 26 06/23/2022 0427   GLUCOSE 152 (H) 06/23/2022 0427  BUN 16 06/23/2022 0427   BUN 27 11/24/2021 1405   CREATININE 0.85 06/23/2022 0427   CREATININE 1.19 03/29/2015 1402   CALCIUM 8.6 (L) 06/23/2022 0427   GFRNONAA >60 06/23/2022 0427   GFRAA 96 09/01/2020 1020    BNP    Component Value Date/Time   BNP 103.1 (H) 07/20/2021 1516    ProBNP    Component Value Date/Time   PROBNP 249 (H) 02/23/2021 1315    Imaging: CT TIBIA FIBULA RIGHT W CONTRAST  Result Date: 06/20/2022 CLINICAL DATA:  Right lower extremity cellulitis with pain and swelling for 3 weeks, no response to doxycycline. EXAM: CT OF THE RIGHT TIBIA FIBULA WITH CONTRAST; CT OF THE RIGHT ANKLE WITH CONTRAST; CT OF THE RIGHT FOOT WITH CONTRAST TECHNIQUE: Multidetector CT imaging of the right lower extremity was performed from the knee joint through the foot following the standard protocol during bolus administration of intravenous contrast. RADIATION DOSE REDUCTION: This exam was performed according to the departmental dose-optimization program which includes automated exposure control, adjustment of the mA and/or kV according to patient size and/or use of iterative reconstruction technique. CONTRAST:  111m OMNIPAQUE IOHEXOL 300 MG/ML  SOLN COMPARISON:  None Available. FINDINGS: Bones/Joint/Cartilage No destructive bone lesion or aggressive periosteal reaction is seen from the distal femoral shaft through the foot. There is only trace suprapatellar bursal fluid which could be physiologic. There is degenerative arthrosis at the knee with partial joint space loss in the lateral patellofemoral joint and medial femorotibial joint, and subcortical degenerative cystic changes in the lateral femoral condyle anteriorly, with small tricompartmental marginal  osteophytes. There is enthesopathic spurring of the anterior aspect of the patella and the plantar aspect of the calcaneus, with additional nonerosive arthrosis of the tibiotalar joint, talonavicular joint, first MTP joint and toe joints and with hammertoe deformities of the second through fifth toes. The bones are normally mineralized. Evidence of fractures is not seen. There is a normal variant fabella posterior to the lateral femoral condyle. There is no popliteal cyst. Ligaments Suboptimally assessed by CT. Muscles and Tendons Area tendons are not optimally evaluated with CT but are grossly intact. There is normal muscle bulk in the distal thigh. In the foreleg there is normal muscle bulk with the exception of mild fatty atrophy in the gastrocnemius. In the foot there is fatty atrophy in the intrinsic plantar musculature, common in diabetics. No intramuscular fluid collection is seen and no intermuscular edema or fascial thickening is evident. There is no soft tissue gas. Soft tissues There is stranding edema anteriorly in the distal thigh extending over the anterior knee. No edema is seen inferior to this in proximal foreleg, but superficial edema is noted moderately in the mid to distal foreleg and foot, without visible underlying abscess or localized fluid collection. There is scattered nonlocalizing fluid in the medial distal foreleg and dorsal foot. No significant edema visible in the deep soft tissues. There is scattered calcification in the distal femoral artery order and popliteal trifurcation arteries. There are a few subcutaneous venous varicosities in the medial foreleg IMPRESSION: 1. Superficial edema without localizing abscess or underlying destructive bone lesion. 2. No visible significant edema in the deep soft tissues or appreciable fascial thickening. There is no soft tissue gas. Lack of overt findings of necrotizing fasciitis on CT however, should not delay surgical exploration if there is  strong clinical evidence of the disease. 3. Mild fatty atrophy in the gastrocnemius, with fatty atrophy in the intrinsic plantar foot musculature. 4. Degenerative and  enthesopathic changes. 5. Calcifications in the popliteal trifurcation arteries. Electronically Signed   By: Telford Nab M.D.   On: 06/20/2022 21:49   CT ANKLE RIGHT W CONTRAST  Result Date: 06/20/2022 CLINICAL DATA:  Right lower extremity cellulitis with pain and swelling for 3 weeks, no response to doxycycline. EXAM: CT OF THE RIGHT TIBIA FIBULA WITH CONTRAST; CT OF THE RIGHT ANKLE WITH CONTRAST; CT OF THE RIGHT FOOT WITH CONTRAST TECHNIQUE: Multidetector CT imaging of the right lower extremity was performed from the knee joint through the foot following the standard protocol during bolus administration of intravenous contrast. RADIATION DOSE REDUCTION: This exam was performed according to the departmental dose-optimization program which includes automated exposure control, adjustment of the mA and/or kV according to patient size and/or use of iterative reconstruction technique. CONTRAST:  163m OMNIPAQUE IOHEXOL 300 MG/ML  SOLN COMPARISON:  None Available. FINDINGS: Bones/Joint/Cartilage No destructive bone lesion or aggressive periosteal reaction is seen from the distal femoral shaft through the foot. There is only trace suprapatellar bursal fluid which could be physiologic. 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 There is enthesopathic spurring of the anterior aspect of the patella and the plantar aspect of the calcaneus, with additional nonerosive arthrosis of the tibiotalar joint, talonavicular joint, first MTP joint and toe joints and with hammertoe deformities of the second through fifth toes. The bones  are normally mineralized. Evidence of fractures is not seen. There is a normal variant fabella posterior to the lateral femoral condyle. There is no popliteal cyst. Ligaments Suboptimally assessed by CT. Muscles and Tendons Area tendons are not optimally evaluated with CT but are grossly intact. There is normal muscle bulk in the distal thigh. In the foreleg there is normal muscle bulk with the exception of mild fatty atrophy in the gastrocnemius. In the foot there is fatty atrophy in the intrinsic plantar musculature, common in diabetics. No intramuscular fluid collection is seen and no intermuscular edema or fascial thickening is evident. There is no soft tissue gas. Soft tissues There is stranding edema anteriorly in the distal thigh extending over the anterior knee. No edema is seen inferior to this in proximal foreleg, but superficial edema is noted moderately in the mid to distal foreleg and foot, without visible underlying abscess or localized fluid collection. There is scattered nonlocalizing fluid in the medial distal foreleg and dorsal foot. No significant edema visible in the deep soft tissues. There is scattered calcification in the distal femoral artery order and popliteal trifurcation arteries. There are a few subcutaneous venous varicosities in the medial foreleg IMPRESSION: 1. Superficial edema without localizing abscess or underlying destructive bone lesion. 2. No visible significant edema in the deep soft tissues or appreciable fascial thickening. There is no soft tissue gas. Lack of overt findings of necrotizing fasciitis on CT however, should not delay surgical exploration if there is strong clinical evidence of the disease. 3. Mild fatty atrophy in the gastrocnemius, with fatty atrophy in the intrinsic plantar foot musculature. 4. Degenerative and enthesopathic changes. 5. Calcifications in the popliteal trifurcation arteries. Electronically Signed   By: KTelford NabM.D.   On: 06/20/2022  21:49   CT FOOT RIGHT W CONTRAST  Result Date: 06/20/2022 CLINICAL DATA:  Right lower extremity cellulitis with pain and swelling for 3 weeks, no response to doxycycline. EXAM: CT OF  THE RIGHT TIBIA FIBULA WITH CONTRAST; CT OF THE RIGHT ANKLE WITH CONTRAST; CT OF THE RIGHT FOOT WITH CONTRAST TECHNIQUE: Multidetector CT imaging of the right lower extremity was performed from the knee joint through the foot following the standard protocol during bolus administration of intravenous contrast. RADIATION DOSE REDUCTION: This exam was performed according to the departmental dose-optimization program which includes automated exposure control, adjustment of the mA and/or kV according to patient size and/or use of iterative reconstruction technique. CONTRAST:  173m OMNIPAQUE IOHEXOL 300 MG/ML  SOLN COMPARISON:  None Available. FINDINGS: Bones/Joint/Cartilage No destructive bone lesion or aggressive periosteal reaction is seen from the distal femoral shaft through the foot. There is only trace suprapatellar bursal fluid which could be physiologic. 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 There is enthesopathic spurring of the anterior aspect of the patella and the plantar aspect of the calcaneus, with additional nonerosive arthrosis of the tibiotalar joint, talonavicular joint, first MTP joint and toe joints and with hammertoe deformities of the second through fifth toes. The bones are normally mineralized. Evidence of fractures is not seen. There is a normal variant fabella posterior to the lateral femoral condyle. There is no popliteal cyst. Ligaments Suboptimally assessed by CT. Muscles and Tendons Area tendons are not optimally evaluated with CT but are grossly intact. There is normal  muscle bulk in the distal thigh. In the foreleg there is normal muscle bulk with the exception of mild fatty atrophy in the gastrocnemius. In the foot there is fatty atrophy in the intrinsic plantar musculature, common in diabetics. No intramuscular fluid collection is seen and no intermuscular edema or fascial thickening is evident. There is no soft tissue gas. Soft tissues There is stranding edema anteriorly in the distal thigh extending over the anterior knee. No edema is seen inferior to this in proximal foreleg, but superficial edema is noted moderately in the mid to distal foreleg and foot, without visible underlying abscess or localized fluid collection. There is scattered nonlocalizing fluid in the medial distal foreleg and dorsal foot. No significant edema visible in the deep soft tissues. There is scattered calcification in the distal femoral artery order and popliteal trifurcation arteries. There are a few subcutaneous venous varicosities in the medial foreleg IMPRESSION: 1. Superficial edema without localizing abscess or underlying destructive bone lesion. 2. No visible significant edema in the deep soft tissues or appreciable fascial thickening. There is no soft tissue gas. Lack of overt findings of necrotizing fasciitis on CT however, should not delay surgical exploration if there is strong clinical evidence of the disease. 3. Mild fatty atrophy in the gastrocnemius, with fatty atrophy in the intrinsic plantar foot musculature. 4. Degenerative and enthesopathic changes. 5. Calcifications in the popliteal trifurcation arteries. Electronically Signed   By: KTelford NabM.D.   On: 06/20/2022 21:49   CUP PACEART REMOTE DEVICE CHECK  Result Date: 06/03/2022 Scheduled remote reviewed. Normal device function.  Next remote 91 days. MKathy Breach RN, CCDS, CV Remote SolutionsNo anomalies detected.    Assessment & Plan:   Chronic diastolic CHF (congestive heart failure) (HCC) - CBC - Basic Metabolic  Panel - Ambulatory referral to Cardiology  2. PAF (paroxysmal atrial fibrillation) (HCC)  - CBC - Basic Metabolic Panel - Ambulatory referral to Cardiology  3. Sleep apnea, unspecified type  - Ambulatory referral  to Pulmonology  4. Cellulitis of right lower extremity  -improved  - mupirocin ointment (BACTROBAN) 2 %; Apply 1 Application topically 2 (two) times daily.  Dispense: 22 g; Refill: 0  5. Type 2 diabetes mellitus with hyperglycemia, with long-term current use of insulin (HCC)  -continue current medications  6. Hospital discharge follow-up  - Ambulatory referral to Cardiology  7. Tachycardia  - Ambulatory referral to Cardiology     Fenton Foy, NP 06/29/2022

## 2022-06-29 NOTE — Assessment & Plan Note (Signed)
-   CBC - Basic Metabolic Panel - Ambulatory referral to Cardiology  2. PAF (paroxysmal atrial fibrillation) (HCC)  - CBC - Basic Metabolic Panel - Ambulatory referral to Cardiology  3. Sleep apnea, unspecified type  - Ambulatory referral to Pulmonology  4. Cellulitis of right lower extremity  -improved  - mupirocin ointment (BACTROBAN) 2 %; Apply 1 Application topically 2 (two) times daily.  Dispense: 22 g; Refill: 0  5. Type 2 diabetes mellitus with hyperglycemia, with long-term current use of insulin (HCC)  -continue current medications  6. Hospital discharge follow-up  - Ambulatory referral to Cardiology  7. Tachycardia  - Ambulatory referral to Cardiology

## 2022-06-30 LAB — CBC
Hematocrit: 38.5 % (ref 37.5–51.0)
Hemoglobin: 11.1 g/dL — ABNORMAL LOW (ref 13.0–17.7)
MCH: 20.3 pg — ABNORMAL LOW (ref 26.6–33.0)
MCHC: 28.8 g/dL — ABNORMAL LOW (ref 31.5–35.7)
MCV: 71 fL — ABNORMAL LOW (ref 79–97)
Platelets: 368 10*3/uL (ref 150–450)
RBC: 5.46 x10E6/uL (ref 4.14–5.80)
RDW: 19.7 % — ABNORMAL HIGH (ref 11.6–15.4)
WBC: 8.2 10*3/uL (ref 3.4–10.8)

## 2022-06-30 LAB — BASIC METABOLIC PANEL
BUN/Creatinine Ratio: 22 (ref 10–24)
BUN: 28 mg/dL — ABNORMAL HIGH (ref 8–27)
CO2: 20 mmol/L (ref 20–29)
Calcium: 8.6 mg/dL (ref 8.6–10.2)
Chloride: 102 mmol/L (ref 96–106)
Creatinine, Ser: 1.27 mg/dL (ref 0.76–1.27)
Glucose: 185 mg/dL — ABNORMAL HIGH (ref 70–99)
Potassium: 3.8 mmol/L (ref 3.5–5.2)
Sodium: 142 mmol/L (ref 134–144)
eGFR: 64 mL/min/{1.73_m2} (ref >59)

## 2022-07-04 ENCOUNTER — Other Ambulatory Visit (HOSPITAL_COMMUNITY): Payer: Self-pay

## 2022-07-04 ENCOUNTER — Other Ambulatory Visit: Payer: Self-pay | Admitting: Nurse Practitioner

## 2022-07-04 NOTE — Progress Notes (Signed)
Paramedicine Encounter    Patient ID: [name], male    DOB: [birth year], 61 y.o.   MRN: [id number]   Arrived for home visit for [name] today where we are reconciling his medications and reviewing upcoming appointments. He was seen by PCP last week and referred to [facility]. I was able to call and set up an appointment for him to be seen there on Aug. 22nd for sleep apnea as he has history of same but does not have proper CPAP equipment at home.   I reviewed meds and confirmed same filling pill box for one week. Refills as noted: Glipizide (called into [facility])   [name] noted he is having some swelling in his right lower ankle- appearing to be signs of his gout flaring up. He did admit to eating mostly red meat over the weekend. He took one Colchicine during our visit today.   I reviewed appointments and overall health management with [name] and we planned our next full home visit next week.   CBG- 121 (freestyle sensor in place)  I assisted Chris with Ozempic patient assistance application and will forward to Marcelle Overlie [facility] today.   Home visit complete.   [name], Friend 07/04/2022   ACTION: Home visit completed

## 2022-07-10 IMAGING — US US RENAL
1 series · 14 of 25 positions shown · non-contrast
Comparison: None.

CLINICAL DATA: Hypertension, diabetes.

EXAM:
RENAL / URINARY TRACT ULTRASOUND COMPLETE

[Series 1: us renal · 14 of 33 slices shown]
[im 1/33]
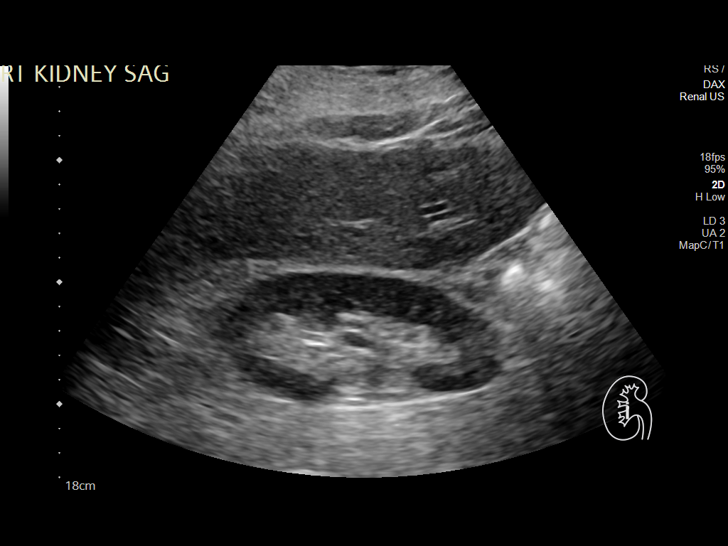
[im 3/33]
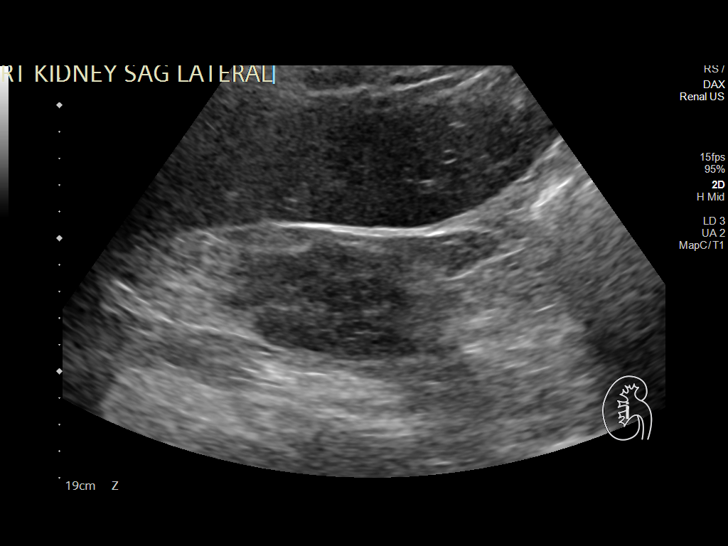
[im 6/33]
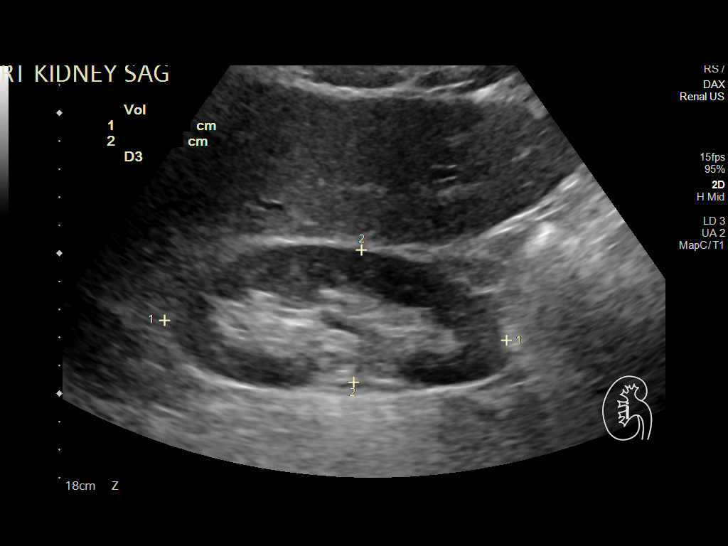
[im 9/33]
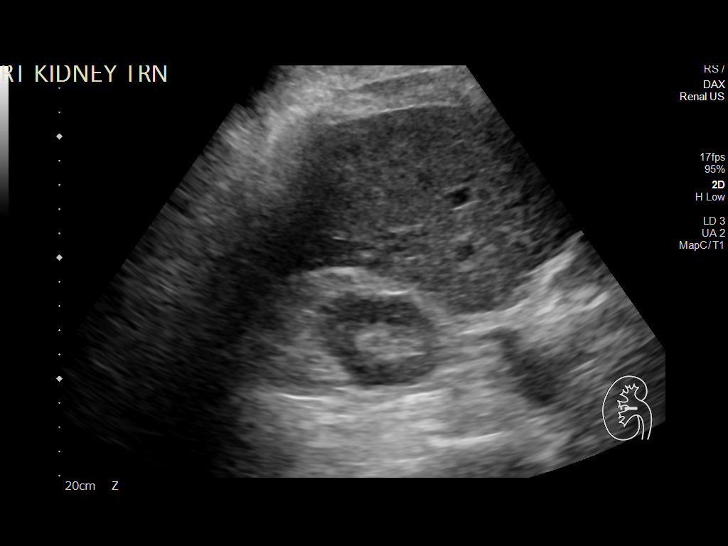
[im 11/33]
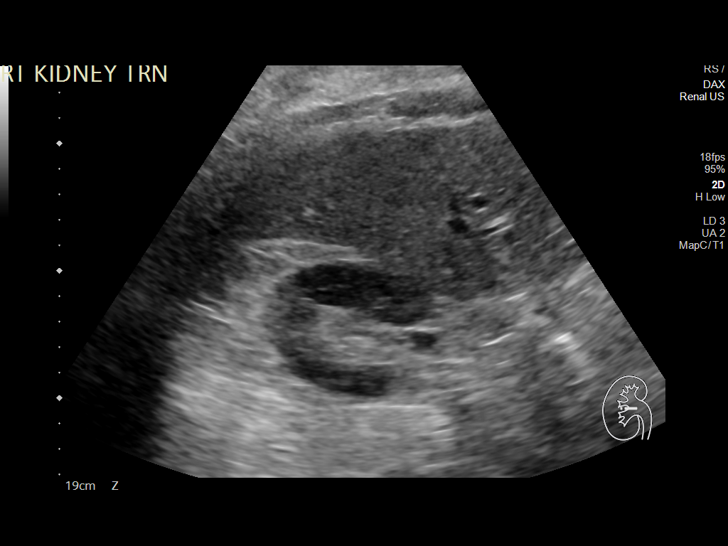
[im 13/33]
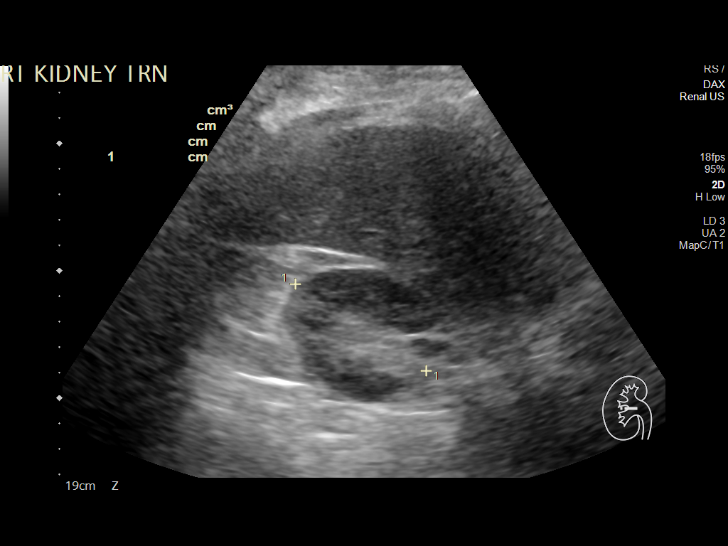
[im 15/33]
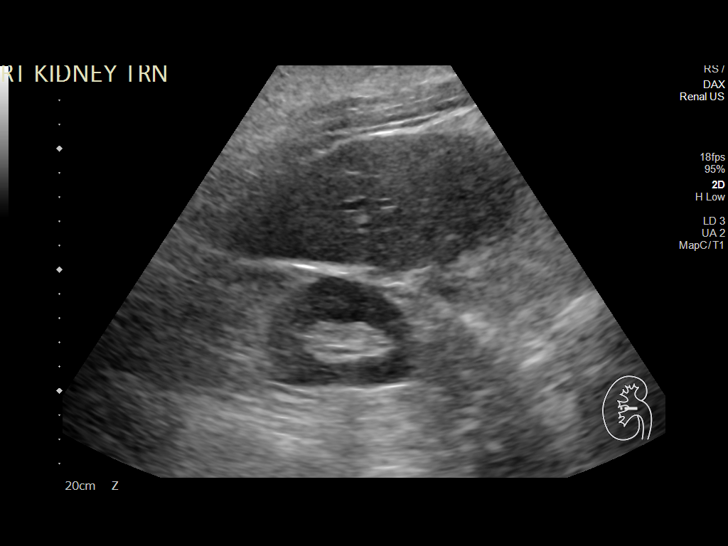
[im 18/33]
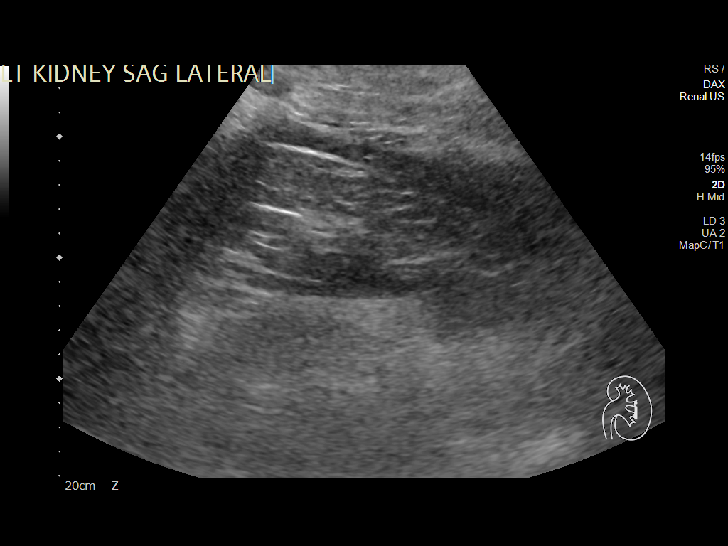
[im 21/33]
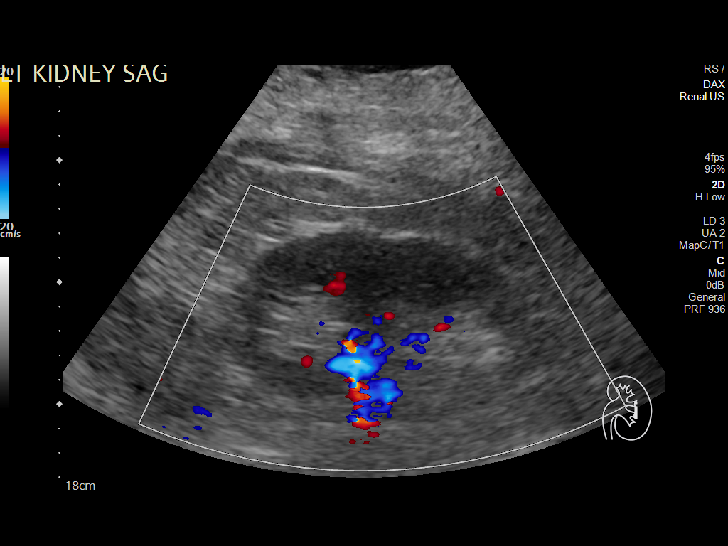
[im 22/33]
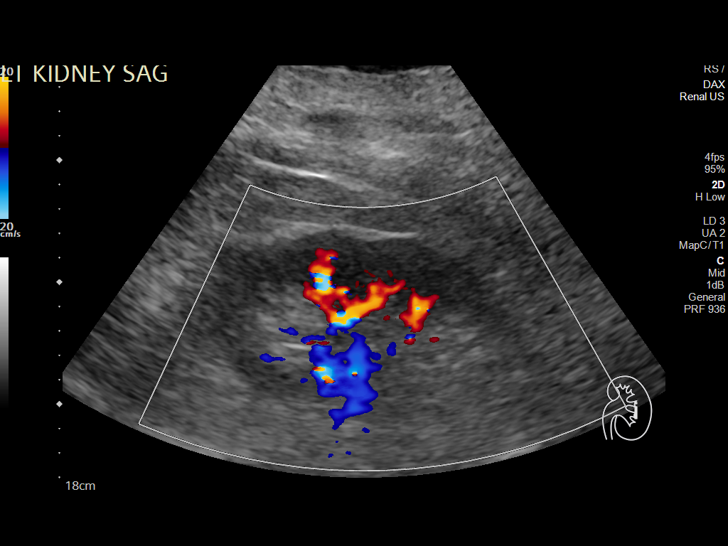
[im 25/33]
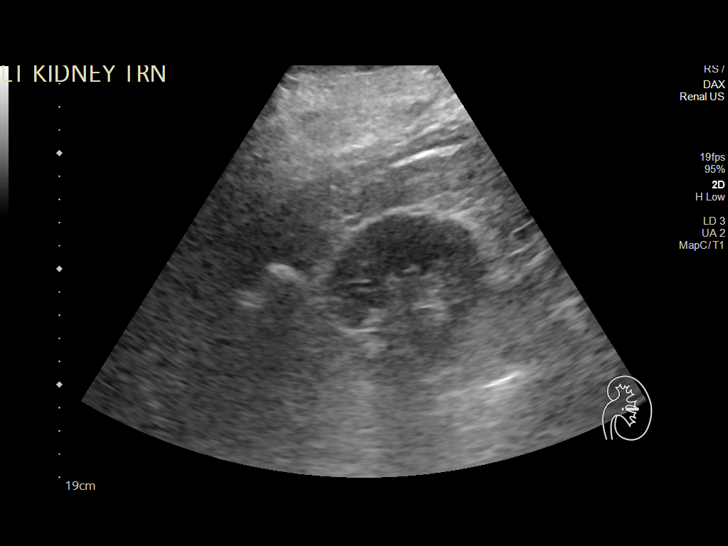
[im 27/33]
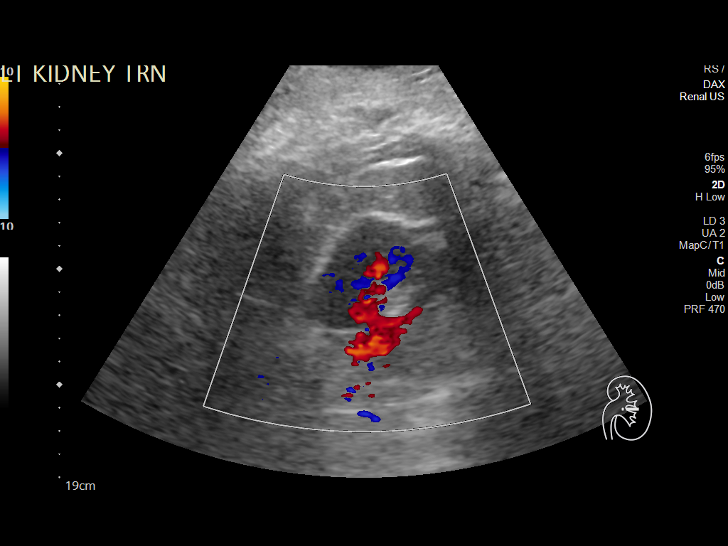
[im 30/33]
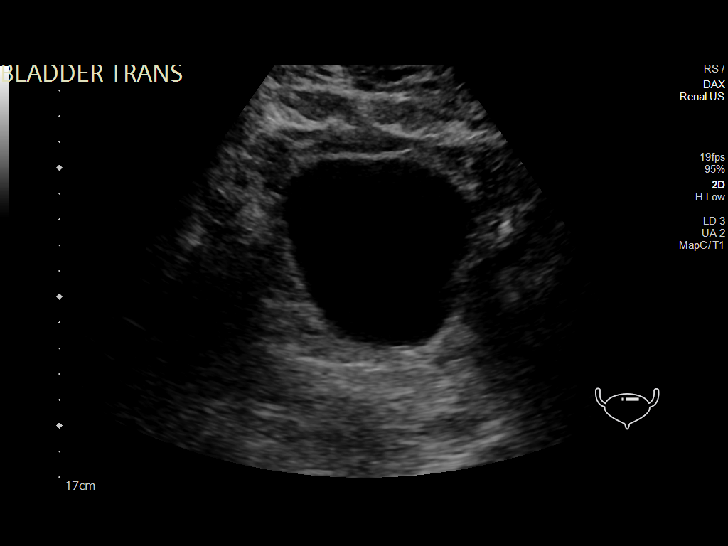
[im 33/33]
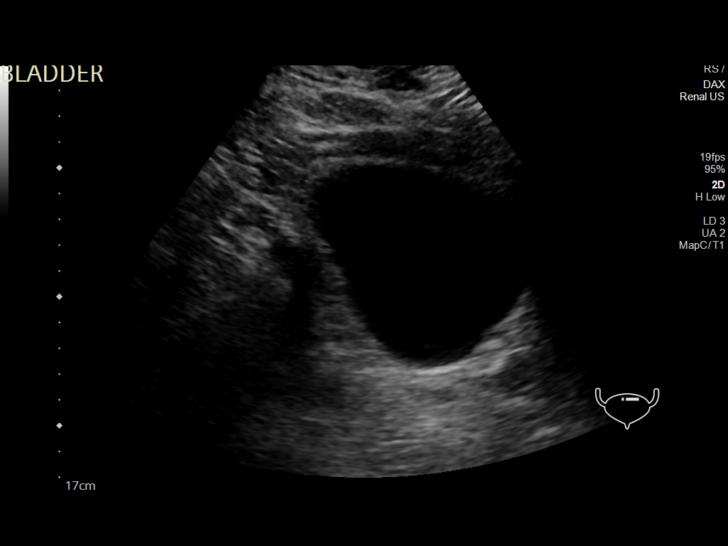

[14 of 25 positions shown; findings below may reference images not displayed]

FINDINGS: Right Kidney:

Renal measurements: 12.2 x 4.7 x 6.2 cm = volume: 185.9 mL .
Echogenicity within normal limits. No mass or hydronephrosis
visualized.

Left Kidney:

Renal measurements: 13.8 x 7.0 x 6.8 cm = volume: 345.9 mL.
Echogenicity within normal limits. No mass or hydronephrosis
visualized.

Bladder:

Appears normal for degree of bladder distention.

Other:

None.
IMPRESSION: Normal renal ultrasound.

## 2022-07-10 IMAGING — DX DG CHEST 1V PORT
1 series · 1 of 1 positions shown · non-contrast
Comparison: April 06, 2020

CLINICAL DATA: Generalized edema

EXAM:
PORTABLE CHEST 1 VIEW

[chest ap]
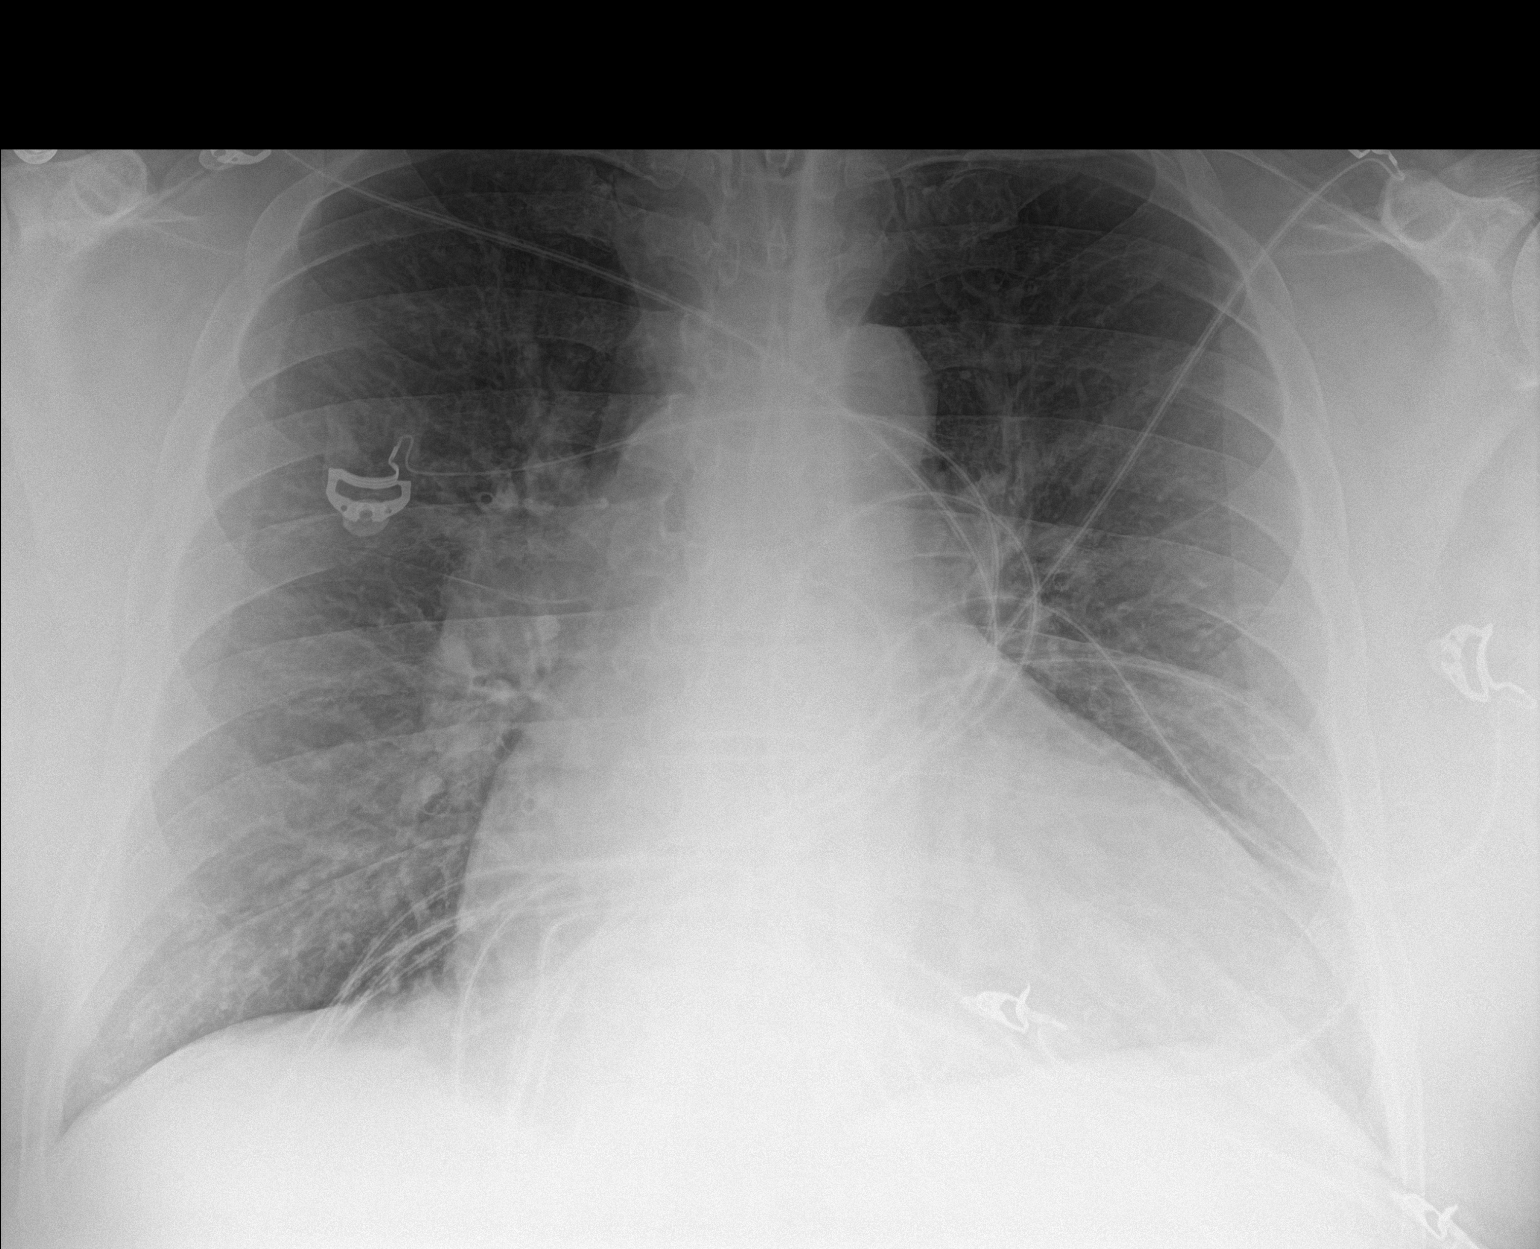

[1 of 1 positions shown; findings below may reference images not displayed]

FINDINGS: The cardiomediastinal silhouette is unchanged and enlarged in
contour. No significant pleural effusion. No pneumothorax. No acute
pleuroparenchymal abnormality. Unchanged perihilar vascular
prominence. Mild diffuse interstitial prominence and peribronchial
cuffing. Visualized abdomen is unremarkable. Multilevel degenerative
changes of the thoracic spine.
IMPRESSION: Constellation of findings are favored to reflect mild pulmonary
edema.

## 2022-07-11 ENCOUNTER — Other Ambulatory Visit (HOSPITAL_COMMUNITY): Payer: Self-pay

## 2022-07-11 IMAGING — DX DG CHEST 1V
1 series · 1 of 1 positions shown · non-contrast
Comparison: 06/15/2020

CLINICAL DATA: Respiratory failure

EXAM:
CHEST  1 VIEW

[chest ap]
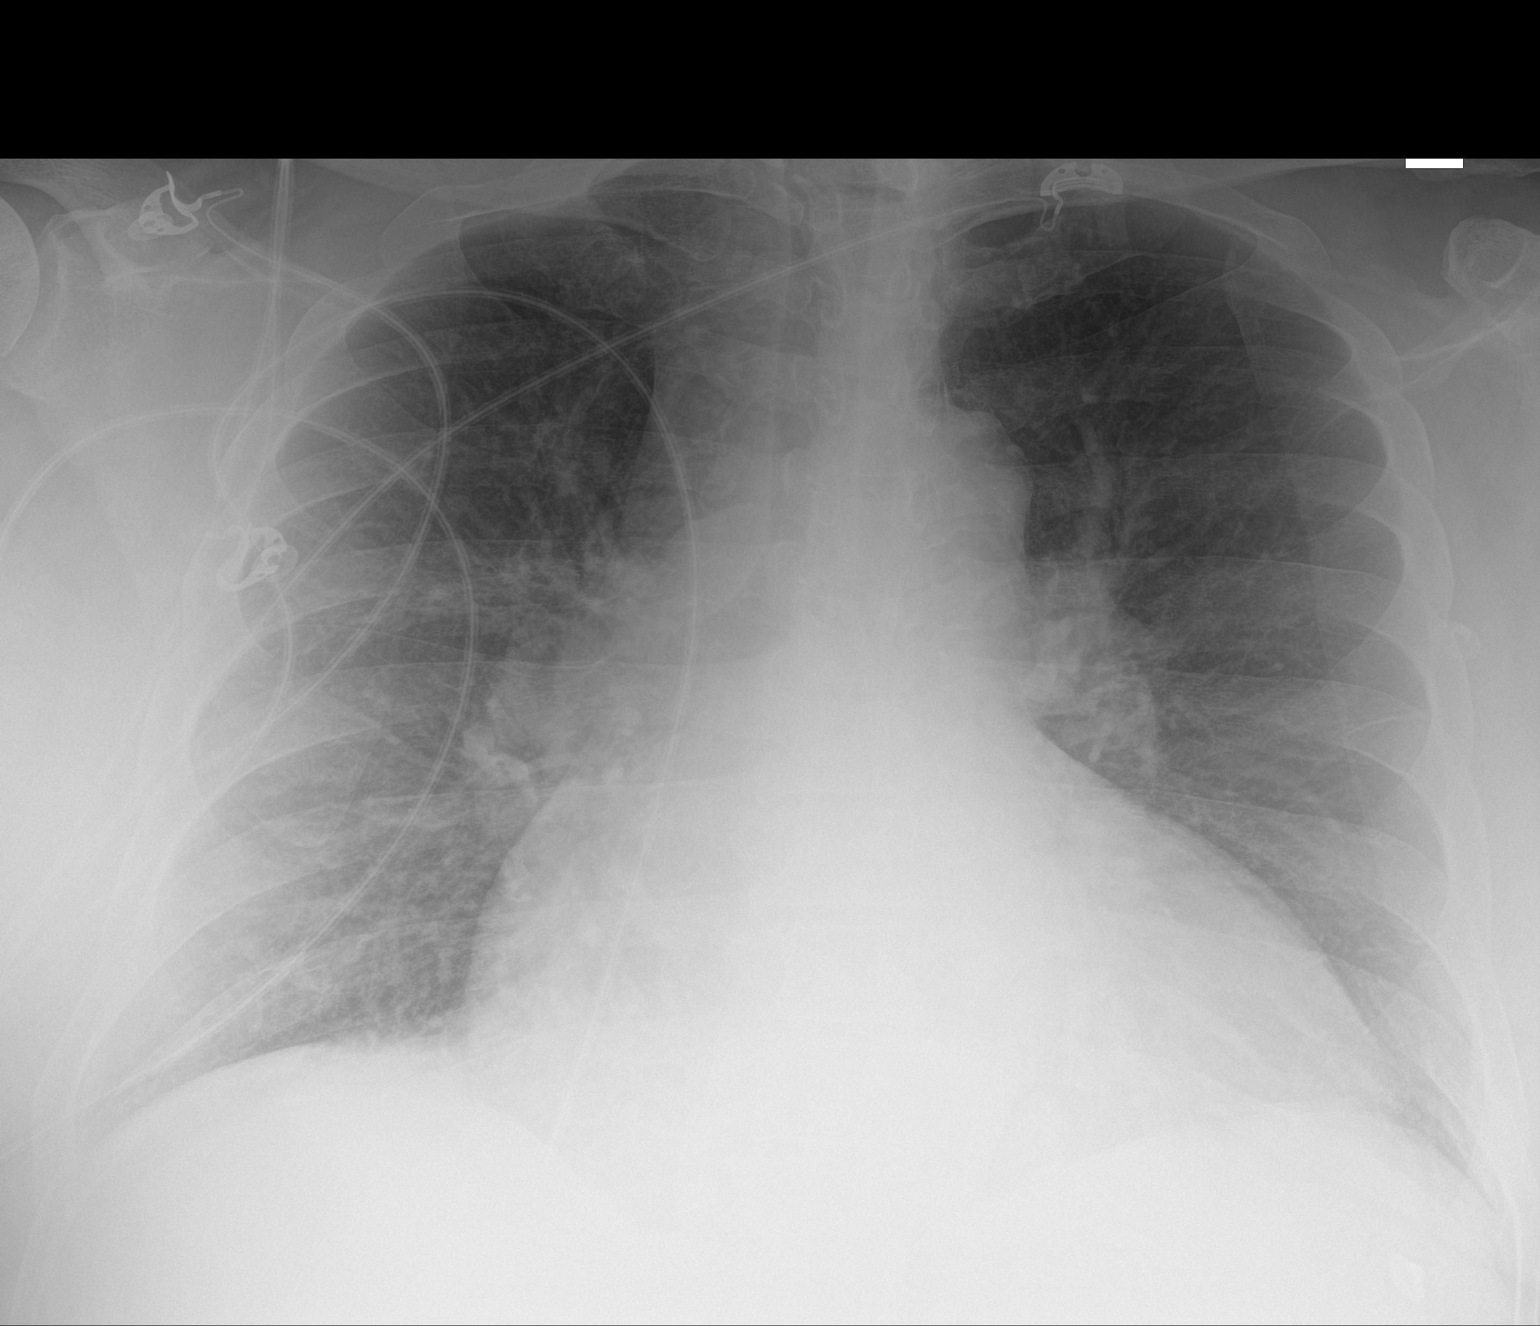

[1 of 1 positions shown; findings below may reference images not displayed]

FINDINGS: Frontal view of the chest demonstrates persistent enlargement of the
cardiac silhouette. There is persistent central vascular congestion,
with mild increased interstitial prominence consistent with
interstitial edema. No effusion or pneumothorax. No focal
consolidation.
IMPRESSION: 1. Slight progression of interstitial edema.

## 2022-07-11 NOTE — Progress Notes (Signed)
Paramedicine Encounter    Patient ID: [name], male    DOB: July 31, 1961, 61 y.o.   MRN: [id number]   Arrived for home visit for [facility] where he reports feeling good with no complaints. He denied chest pain, shortness of breath, dizziness or issues with med compliance. He verified his meds and I filled his pill box for one week. We also reviewed his upcoming appointments and I wrote them down on his calendar. He agreed with home visit for one week.   Refills:  Lantus Entresto Potassium  Glipizide   CBG- 140   WT- 309lbs  RR- 16     Patient Care Team: Bo Merino I, NP as PCP - General (Nurse Practitioner) [name], MD as PCP - Cardiology (Cardiology) [name], MD as PCP - Advanced Heart Failure (Cardiology) [name], MD as PCP - Electrophysiology (Cardiology) [name], LCSW as Social Worker (Licensed Clinical Social Worker) [name], MD as Consulting Physician (Gastroenterology)  Patient Active Problem List   Diagnosis Date Noted   Cellulitis 06/22/2022   Cellulitis, leg 06/20/2022   Presence of Watchman left atrial appendage closure device 10/28/2021   Atrial fibrillation ([place]) 10/27/2021   Melena    Chronic diastolic CHF (congestive heart failure) ([place])    GI bleed 06/29/2021   Severe anemia 06/29/2021   Adenomatous polyp of ascending colon    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   Acute blood loss anemia    Angiodysplasia of stomach    Chronic anticoagulation    CHF exacerbation ([place]) 06/15/2020   AKI (acute kidney injury) ([place]) 06/15/2020   CKD (chronic kidney disease), stage III ([place]) 06/15/2020   Anemia due to chronic blood loss    Gastric AVM    Angiodysplasia of duodenum with hemorrhage    Chronic systolic CHF (congestive heart failure) ([place]) 04/05/2020   Anemia 04/05/2020   PAF (paroxysmal atrial fibrillation) ([place]) 04/05/2020   OSA on CPAP 04/05/2020   Syncope and collapse  04/05/2020   Type 2 diabetes mellitus with hyperglycemia, with long-term current use of insulin ([place]) 12/03/2019   Diabetic polyneuropathy associated with type 2 diabetes mellitus ([place]) 12/03/2019   Erectile dysfunction 12/03/2019   Paranoid schizophrenia, chronic condition ([place]) 01/26/2015   Severe recurrent major depressive disorder with psychotic features ([place]) 01/26/2015   GAD (generalized anxiety disorder) 01/26/2015   OCD (obsessive compulsive disorder) 01/26/2015   Panic disorder without agoraphobia 01/26/2015   Insomnia 01/26/2015    Current Outpatient Medications:    acetaminophen (TYLENOL) 500 MG tablet, Take 2 tablets (1,000 mg total) by mouth 2 (two) times daily as needed. (Patient taking differently: Take 1,000 mg by mouth 2 (two) times daily as needed for mild pain.), Disp: 180 tablet, Rfl: 3   albuterol (VENTOLIN HFA) 108 (90 Base) MCG/ACT inhaler, Inhale 2 puffs into the lungs every 6 (six) hours as needed for wheezing or shortness of breath. (Patient not taking: Reported on 06/29/2022), Disp: 18 g, Rfl: 1   aspirin EC 81 MG tablet, Take 1 tablet (81 mg total) by mouth daily. Swallow whole., Disp: 90 tablet, Rfl: 3   atorvastatin (LIPITOR) 40 MG tablet, Take 1 tablet (40 mg total) by mouth daily at 6 PM., Disp: 90 tablet, Rfl: 1   blood glucose meter kit and supplies KIT, Dispense based on patient and insurance preference. Use up to four times daily as directed. (FOR ICD-9 250.00, 250.01)., Disp: 1 each, Rfl: 0   carvedilol (COREG) 6.25 MG tablet, Take  1 tablet (6.25 mg total) by mouth 2 (two) times daily with a meal., Disp: 60 tablet, Rfl: 6   colchicine 0.6 MG tablet, Daily for 7 days then daily PRN for gout flare, Disp: 30 tablet, Rfl: 0   Continuous Blood Gluc Receiver (FREESTYLE LIBRE 14 DAY READER) DEVI, 1 each by Does not apply route as needed., Disp: 1 each, Rfl: 11   Continuous Blood Gluc Sensor (FREESTYLE LIBRE 14 DAY SENSOR) MISC, Use one applicator every 14 (fourteen)  days., Disp: 6 each, Rfl: 3   cyclobenzaprine (FLEXERIL) 10 MG tablet, Take 10 mg by mouth 3 (three) times daily as needed for muscle spasms., Disp: , Rfl:    diclofenac Sodium (VOLTAREN) 1 % GEL, Apply 2 g topically daily as needed for pain., Disp: , Rfl:    empagliflozin (JARDIANCE) 10 MG TABS tablet, Take 1 tablet (10 mg total) by mouth daily before breakfast., Disp: 30 tablet, Rfl: 6   ferrous sulfate 325 (65 FE) MG tablet, Take 1 tablet (325 mg total) by mouth every other day. (Patient taking differently: Take 325 mg by mouth every Monday, Wednesday, and Friday.), Disp: 30 tablet, Rfl: 1   furosemide (LASIX) 40 MG tablet, Take 1.5 tablets (60 mg total) by mouth 2 (two) times daily., Disp: 90 tablet, Rfl: 3   gabapentin (NEURONTIN) 800 MG tablet, Take 1 tablet (800 mg total) by mouth every 6 (six) hours., Disp: 120 tablet, Rfl: 5   glipiZIDE (GLUCOTROL) 10 MG tablet, TAKE 1 TABLET BY MOUTH TWICE DAILY BEFORE A MEAL, Disp: 180 tablet, Rfl: 0   insulin glargine (LANTUS) 100 UNIT/ML injection, Inject 0.2 mLs (20 Units total) into the skin daily., Disp: 20 mL, Rfl: 1   Insulin Lispro Prot & Lispro (HUMALOG MIX 75/25 KWIKPEN) (75-25) 100 UNIT/ML Kwikpen, Inject 30 Units into the skin 2 (two) times daily., Disp: 54 mL, Rfl: 1   Insulin Syringe-Needle U-100 (TRUEPLUS INSULIN SYRINGE) 31G X 5/16" 0.3 ML MISC, USE 3 TIMES DAILY TO INJECT INSULIN, Disp: 300 each, Rfl: 3   metFORMIN (GLUCOPHAGE) 500 MG tablet, TAKE 1 TABLET BY MOUTH TWICE DAILY WITH A MEAL, Disp: 180 tablet, Rfl: 0   metFORMIN (GLUCOPHAGE) 500 MG tablet, Take 1 tablet (500 mg total) by mouth 2 (two) times daily with a meal., Disp: 60 tablet, Rfl: 2   Multiple Vitamins-Minerals (CENTRUM SILVER 50+MEN) TABS, Take 1 tablet by mouth daily., Disp: , Rfl:    mupirocin ointment (BACTROBAN) 2 %, Apply 1 Application topically 2 (two) times daily., Disp: 22 g, Rfl: 0   Oxycodone HCl 10 MG TABS, Take 10 mg by mouth 3 (three) times daily., Disp: , Rfl:     pantoprazole (PROTONIX) 40 MG tablet, Take 1 tablet (40 mg total) by mouth daily., Disp: 90 tablet, Rfl: 1   potassium chloride SA (KLOR-CON M) 20 MEQ tablet, Take 2 tablets (40 mEq total) by mouth daily., Disp: 60 tablet, Rfl: 6   QUEtiapine (SEROQUEL) 300 MG tablet, Take 1.5 tablets (450 mg total) by mouth at bedtime., Disp: 135 tablet, Rfl: 1   sacubitril-valsartan (ENTRESTO) 24-26 MG, Take 1 tablet by mouth 2 (two) times daily., Disp: 180 tablet, Rfl: 3   Semaglutide, 2 MG/DOSE, (OZEMPIC, 2 MG/DOSE,) 8 MG/3ML SOPN, Inject 2 mg into the skin once a week. Monday, Disp: , Rfl:    spironolactone (ALDACTONE) 25 MG tablet, Take 0.5 tablets (12.5 mg total) by mouth at bedtime., Disp: 45 tablet, Rfl: 3   tadalafil (CIALIS) 20 MG tablet, Take 20 mg  by mouth daily as needed for erectile dysfunction., Disp: , Rfl:    TRUEPLUS PEN NEEDLES 31G X 6 MM MISC, USE AS DIRECTED, Disp: 100 each, Rfl: 0 No Known Allergies   Social History   Socioeconomic History   Marital status: Legally Separated    Spouse name: Not on file   Number of children: Not on file   Years of education: Not on file   Highest education level: Not on file  Occupational History   Not on file  Tobacco Use   Smoking status: Former    Passive exposure: Never   Smokeless tobacco: Never  Vaping Use   Vaping Use: Never used  Substance and Sexual Activity   Alcohol use: Not Currently    Alcohol/week: 2.0 standard drinks of alcohol    Types: 2 Cans of beer per week   Drug use: No   Sexual activity: Yes    Birth control/protection: None  Other Topics Concern   Not on file  Social History Narrative   Not on file   Social Determinants of Health   Financial Resource Strain: Low Risk  (12/23/2019)   Overall Financial Resource Strain (CARDIA)    Difficulty of Paying Living Expenses: Not very hard  Food Insecurity: No Food Insecurity (07/01/2021)   Hunger Vital Sign    Worried About Running Out of Food in the Last Year: Never  true    Ran Out of Food in the Last Year: Never true  Transportation Needs: Unmet Transportation Needs (07/01/2021)   PRAPARE - Hydrologist (Medical): Yes    Lack of Transportation (Non-Medical): Yes  Physical Activity: Not on file  Stress: Not on file  Social Connections: Not on file  Intimate Partner Violence: Not on file    Physical Exam      Future Appointments  Date Time Provider [place]  07/18/2022  2:00 PM Parrett, Fonnie Mu, NP [facility] None  08/01/2022  2:20 PM Baldwin [place], PA-C [facility]  08/07/2022 10:20 AM Fenton Foy, NP [facility] None  09/01/2022  7:40 AM [facility] DEVICE REMOTES [facility]  09/29/2022  2:40 PM Fenton Foy, NP [place] None  12/01/2022  7:40 AM [facility] DEVICE REMOTES [facility]  03/02/2023  7:40 AM [facility] DEVICE REMOTES [facility]  06/01/2023  7:40 AM [facility] DEVICE REMOTES [facility]     ACTION: Home visit completed

## 2022-07-13 ENCOUNTER — Telehealth: Payer: Self-pay | Admitting: Pharmacist

## 2022-07-13 NOTE — Telephone Encounter (Signed)
[employer] pt assistance paperwork faxed 8/10. Rx faxed 8/17. Pt has been without for a while, therefore will need to restart titration.

## 2022-07-18 ENCOUNTER — Ambulatory Visit (INDEPENDENT_AMBULATORY_CARE_PROVIDER_SITE_OTHER): Payer: Medicare Other | Admitting: Adult Health

## 2022-07-18 ENCOUNTER — Encounter: Payer: Self-pay | Admitting: Adult Health

## 2022-07-18 ENCOUNTER — Other Ambulatory Visit (HOSPITAL_COMMUNITY): Payer: Self-pay

## 2022-07-18 VITALS — BP 130/74 | HR 120 | Temp 98.4°F | Ht 74.0 in | Wt 303.6 lb

## 2022-07-18 DIAGNOSIS — E669 Obesity, unspecified: Secondary | ICD-10-CM | POA: Diagnosis not present

## 2022-07-18 DIAGNOSIS — I480 Paroxysmal atrial fibrillation: Secondary | ICD-10-CM | POA: Diagnosis not present

## 2022-07-18 DIAGNOSIS — G4733 Obstructive sleep apnea (adult) (pediatric): Secondary | ICD-10-CM | POA: Diagnosis not present

## 2022-07-18 DIAGNOSIS — I5032 Chronic diastolic (congestive) heart failure: Secondary | ICD-10-CM | POA: Diagnosis not present

## 2022-07-18 NOTE — Telephone Encounter (Signed)
[employer] to follow up on application. Was not able to use automated machine. LVM for them to call me back as wait time was long.

## 2022-07-18 NOTE — Progress Notes (Signed)
Paramedicine Encounter    Patient ID: [name], male    DOB: 1961-05-30, 61 y.o.   MRN: [id number]  Met with [name] in the home today for a medication review following his appointment at [facility].     He reports feeling well and his weight being down 6lbs from last week and that he is feeling good with a lot of energy.  He stated he has been using his girlfriends Linzess and KETO Supplements (green tea extract and garcinia cambogia) to help him lose weight. I advised him that he should not be taking these without the approval of his doctor as this could interact with his prescriptions. He verbalized understanding.   We reviewed meds and confirmed same, I filled two weeks of medications for [name]. We reviewed upcoming appointments and confirmed same.   I plan to see [name] in two weeks for follow up, he agreed with plan.   Visit complete.   Refills: Potassium Entresto Jardiance Carvedilol Lasix Glipizide     ACTION: Home visit completed

## 2022-07-18 NOTE — Assessment & Plan Note (Signed)
Continue follow-up with cardiology 

## 2022-07-18 NOTE — Progress Notes (Signed)
_0  ID: [name], male    DOB: [birth year], 61 y.o.   MRN: [id number]  Chief Complaint  Patient presents with   Consult    Referring provider: Fenton Foy, NP  HPI: 61 year old male seen for sleep consult July 18, 2022 to establish for sleep apnea Medical history significant for A-fib, congestive heart failure status post AICD placement, schizophrenia, chronic pain, chronic kidney disease  TEST/EVENTS :   07/18/2022 Sleep consult  Patient presents for a sleep consult today.  Patient says he was diagnosed with sleep apnea over 10 years ago while he was in prison.  He did receive the CPAP machine while in prison and wore everynight .   However since being at home . Has not been able to wear.   Patient says he does not have any supplies and cannot wear his CPAP.  Patient complains of snoring, restless sleep, waking up unrefreshed and tired, morning headaches and daytime sleepiness.  Typically goes to bed about 9 to 10 PM.  Takes about an hour to fall asleep.  Is up 2-3 times at night.  And gets up about 6 to 7 AM.  Patient has had a sleep study in the past but we do not have a copy.  Patient has been working on weight loss , down 40lbs , current weight is 303 with BMI at 38.  Was set up for NPSG by PCP , done on 05/11/22 showed severe OSA with AHI at 70/hr, SpO2 low at 51%. Titration showed inadequate control on CPAP . Required BIPAP19/15 cm . Large size [employer] Full Face Evora Full mask  Minimal caffeine intake- 1 cup.   No symptoms suspicious for cataplexy or sleep paralysis.  No removable dental work. Does have an extensive medical history including A-fib, congestive heart failure status post AICD placement. Takes Seroquel for insomnia and depression .  Has neuropathy , on gabapentin. Has chronic pain on oxycodone Three times a day  Epworth score 14 out of 24  Remains active, drives. Does yard work and house chores.   SH: Married. 5 kids-adult . Former smoker.  No alcohol. Previous drug use- none since 2000.  From . Disabled.   FH : CHF , Mental illness    No Known Allergies  Immunization History  Administered Date(s) Administered   Influenza,inj,Quad PF,6+ Mos 10/13/2019, 09/13/2020, 08/03/2021   PFIZER(Purple Top)SARS-COV-2 Vaccination 03/13/2020   Pneumococcal Polysaccharide-23 10/16/2019   Tdap 09/13/2020   Zoster Recombinat (Shingrix) 08/03/2021    Past Medical History:  Diagnosis Date   AICD (automatic cardioverter/defibrillator) present    Anxiety    Atrial fibrillation ([facility])    CAD (coronary artery disease)    Cardiomyopathy ([place])    CHF (congestive heart failure) ([place]) 09/2019   Depression    Diabetes mellitus    Erectile dysfunction 11/2019   GI bleeding    H/O right heart catheterization 09/2019   Hypertension    Presence of permanent cardiac pacemaker    Presence of Watchman left atrial appendage closure device 10/27/2021   27 mm Watchman Flex Device per Dr. Quentin Ore   Schizophrenia [facility])    Sleep apnea    uses cpap    Tobacco History: Social History   Tobacco Use  Smoking Status Never   Passive exposure: Never  Smokeless Tobacco Never   Counseling given: Not Answered   Outpatient Medications Prior to Visit  Medication Sig Dispense Refill   acetaminophen (TYLENOL) 500 MG tablet Take 2 tablets (1,000 mg total) by  mouth 2 (two) times daily as needed. (Patient taking differently: Take 1,000 mg by mouth 2 (two) times daily as needed for mild pain.) 180 tablet 3   albuterol (VENTOLIN HFA) 108 (90 Base) MCG/ACT inhaler Inhale 2 puffs into the lungs every 6 (six) hours as needed for wheezing or shortness of breath. 18 g 1   aspirin EC 81 MG tablet Take 1 tablet (81 mg total) by mouth daily. Swallow whole. 90 tablet 3   atorvastatin (LIPITOR) 40 MG tablet Take 1 tablet (40 mg total) by mouth daily at 6 PM. 90 tablet 1   blood glucose meter kit and supplies KIT Dispense based on patient and insurance preference.  Use up to four times daily as directed. (FOR ICD-9 250.00, 250.01). 1 each 0   carvedilol (COREG) 6.25 MG tablet Take 1 tablet (6.25 mg total) by mouth 2 (two) times daily with a meal. 60 tablet 6   colchicine 0.6 MG tablet Daily for 7 days then daily PRN for gout flare 30 tablet 0   Continuous Blood Gluc Receiver (FREESTYLE LIBRE 14 DAY READER) DEVI 1 each by Does not apply route as needed. 1 each 11   Continuous Blood Gluc Sensor (FREESTYLE LIBRE 14 DAY SENSOR) MISC Use one applicator every 14 (fourteen) days. 6 each 3   cyclobenzaprine (FLEXERIL) 10 MG tablet Take 10 mg by mouth 3 (three) times daily as needed for muscle spasms.     diclofenac Sodium (VOLTAREN) 1 % GEL Apply 2 g topically daily as needed for pain.     empagliflozin (JARDIANCE) 10 MG TABS tablet Take 1 tablet (10 mg total) by mouth daily before breakfast. 30 tablet 6   ferrous sulfate 325 (65 FE) MG tablet Take 1 tablet (325 mg total) by mouth every other day. (Patient taking differently: Take 325 mg by mouth every Monday, Wednesday, and Friday.) 30 tablet 1   furosemide (LASIX) 40 MG tablet Take 1.5 tablets (60 mg total) by mouth 2 (two) times daily. 90 tablet 3   gabapentin (NEURONTIN) 800 MG tablet Take 1 tablet (800 mg total) by mouth every 6 (six) hours. 120 tablet 5   glipiZIDE (GLUCOTROL) 10 MG tablet TAKE 1 TABLET BY MOUTH TWICE DAILY BEFORE A MEAL 180 tablet 0   Insulin Lispro Prot & Lispro (HUMALOG MIX 75/25 KWIKPEN) (75-25) 100 UNIT/ML Kwikpen Inject 30 Units into the skin 2 (two) times daily. 54 mL 1   Insulin Syringe-Needle U-100 (TRUEPLUS INSULIN SYRINGE) 31G X 5/16" 0.3 ML MISC USE 3 TIMES DAILY TO INJECT INSULIN 300 each 3   metFORMIN (GLUCOPHAGE) 500 MG tablet TAKE 1 TABLET BY MOUTH TWICE DAILY WITH A MEAL 180 tablet 0   metFORMIN (GLUCOPHAGE) 500 MG tablet Take 1 tablet (500 mg total) by mouth 2 (two) times daily with a meal. 60 tablet 2   Multiple Vitamins-Minerals (CENTRUM SILVER 50+MEN) TABS Take 1 tablet by  mouth daily.     mupirocin ointment (BACTROBAN) 2 % Apply 1 Application topically 2 (two) times daily. 22 g 0   Oxycodone HCl 10 MG TABS Take 10 mg by mouth 3 (three) times daily.     pantoprazole (PROTONIX) 40 MG tablet Take 1 tablet (40 mg total) by mouth daily. 90 tablet 1   potassium chloride SA (KLOR-CON M) 20 MEQ tablet Take 2 tablets (40 mEq total) by mouth daily. 60 tablet 6   QUEtiapine (SEROQUEL) 300 MG tablet Take 1.5 tablets (450 mg total) by mouth at bedtime. 135 tablet 1  sacubitril-valsartan (ENTRESTO) 24-26 MG Take 1 tablet by mouth 2 (two) times daily. 180 tablet 3   Semaglutide, 2 MG/DOSE, (OZEMPIC, 2 MG/DOSE,) 8 MG/3ML SOPN Inject 2 mg into the skin once a week. Monday     spironolactone (ALDACTONE) 25 MG tablet Take 0.5 tablets (12.5 mg total) by mouth at bedtime. 45 tablet 3   tadalafil (CIALIS) 20 MG tablet Take 20 mg by mouth daily as needed for erectile dysfunction.     TRUEPLUS PEN NEEDLES 31G X 6 MM MISC USE AS DIRECTED 100 each 0   insulin glargine (LANTUS) 100 UNIT/ML injection Inject 0.2 mLs (20 Units total) into the skin daily. 20 mL 1   No facility-administered medications prior to visit.     Review of Systems:   Constitutional:   No  weight loss, night sweats,  Fevers, chills,  +fatigue, or  lassitude.  HEENT:   No headaches,  Difficulty swallowing,  Tooth/dental problems, or  Sore throat,                No sneezing, itching, ear ache, nasal congestion, post nasal drip,   CV:  No chest pain,  Orthopnea, PND, swelling in lower extremities, anasarca, dizziness, palpitations, syncope.   GI  No heartburn, indigestion, abdominal pain, nausea, vomiting, diarrhea, change in bowel habits, loss of appetite, bloody stools.   Resp: No shortness of breath with exertion or at rest.  No excess mucus, no productive cough,  No non-productive cough,  No coughing up of blood.  No change in color of mucus.  No wheezing.  No chest wall deformity  Skin: no rash or  lesions.  GU: no dysuria, change in color of urine, no urgency or frequency.  No flank pain, no hematuria   MS:  + joint pain     Physical Exam  BP 130/74 (BP Location: Left Arm, Patient Position: Sitting, Cuff Size: Normal)   Pulse (!) 120   Temp 98.4 F (36.9 C) (Oral)   Ht _0  (1.88 m)   Wt (!) 303 lb 9.6 oz (137.7 kg)   SpO2 96%   BMI 38.98 kg/m   GEN: A/Ox3; pleasant , NAD, well nourished    HEENT:  [place]/AT,  NOSE-clear, THROAT-clear, no lesions, no postnasal drip or exudate noted. Class 3 MP airway   NECK:  Supple w/ fair ROM; no JVD; normal carotid impulses w/o bruits; no thyromegaly or nodules palpated; no lymphadenopathy.    RESP  Clear  P & A; w/o, wheezes/ rales/ or rhonchi. no accessory muscle use, no dullness to percussion  CARD:  RRR, no m/r/g, no peripheral edema, pulses intact, no cyanosis or clubbing.  GI:   Soft & nt; nml bowel sounds; no organomegaly or masses detected.   Musco: Warm bil, no deformities or joint swelling noted.   Neuro: alert, no focal deficits noted.    Skin: Warm, no lesions or rashes    Lab Results:   BMET   BNP    Imaging:       Assessment & Plan:   No problem-specific Assessment & Plan notes found for this encounter.     Rexene Edison, NP 07/18/2022

## 2022-07-18 NOTE — Assessment & Plan Note (Signed)
Healthy weight loss discussed 

## 2022-07-18 NOTE — Patient Instructions (Signed)
Begin BIPAP At bedtime and with naps, goal is wear to >6hrs.  BIPAP care  Work on healthy weight loss Do not drive if sleepy  Healthy sleep regimen  Use caution with sedating medications  Follow up in 3 months and As needed

## 2022-07-18 NOTE — Assessment & Plan Note (Addendum)
Severe OSA -repeat sleep study shows very severe sleep apnea with AHI at 70/hour and SPO2 low at 51%.  Titration portion of sleep study showed inadequate control on CPAP and required BiPAP IPAP at 19 cm H2O and EPAP at 15 cm H2O.  We discussed his sleep study results in detail.  Patient previously been on CPAP but has been off for the last year because he did not have any supplies.  We will send urgent order for BiPAP to begin at bedtime.  Patient education was given.  BiPAP care was discussed. Patient has extensive comorbidities including congestive heart failure and A-fib.  .- discussed how weight can impact sleep and risk for sleep disordered breathing - discussed options to assist with weight loss: combination of diet modification, cardiovascular and strength training exercises   - had an extensive discussion regarding the adverse health consequences related to untreated sleep disordered breathing - specifically discussed the risks for hypertension, coronary artery disease, cardiac dysrhythmias, cerebrovascular disease, and diabetes - lifestyle modification discussed   - discussed how sleep disruption can increase risk of accidents, particularly when driving - safe driving practices were discussed Long discussion with patient regarding sedating medications.  Patient is on numerous sedating medications including chronic pain medicines, sleep meds and gabapentin, and Flexeril.  Plan  Patient Instructions  Begin BIPAP At bedtime and with naps, goal is wear to >6hrs.  BIPAP care  Work on healthy weight loss Do not drive if sleepy  Healthy sleep regimen  Use caution with sedating medications  Follow up in 3 months and As needed

## 2022-07-23 NOTE — Progress Notes (Signed)
Reviewed and agree with assessment/plan.   Chesley Mires, MD [facility] 07/23/2022, 7:27 AM Pager:  [phone]

## 2022-07-25 ENCOUNTER — Other Ambulatory Visit (HOSPITAL_COMMUNITY): Payer: Self-pay | Admitting: Family Medicine

## 2022-07-28 ENCOUNTER — Telehealth: Payer: Self-pay
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
## 2022-07-30 NOTE — Progress Notes (Unsigned)
Cardiology Office Note Date:  07/30/2022  Patient ID:  [name], [name] June 30, 1961, MRN [id number] PCP:  Teena Dunk, NP  Cardiologist:  Dr. Radford Pax AHF: Dr. Aundra Dubin Electrophysiologist: Dr. Quentin Ore  ***refresh   Chief Complaint: *** ICD therapies  History of Present Illness: [name] is a 61 y.o. male with history of CAD (PCI 2012), chronic CHF, ICM, ICD, schizophrenia, AFib, recurrent GIB > Watchman (Dec 2022), DM, HTN, HLD, OSA w/CPAP.  Record indicates: He can write his name only. He is only able to read a few words. Thinks he completed the 7th grade.  He comes in today to be seen for Dr. Quentin Ore, last seen by him at the time of the [place] implant Dec 2022  Saw HF team APP 12/26/21, doing well, remained off ETOH, insurance declined new sleep study, they were working on this, l=planned for labs, no changes were made.  More recently saw the structural team APP on 04/26/22, plavix stopped > ASA 65m daily.  No further need for SBE prophylaxis.   *** ? Looks like AFib *** no a/c, watchman *** room for more nodal blocker? *** chest wall trauma days prior *** any way to see burden?  Device information Biotronik single chamber ICD implanted 09/03/2020   Past Medical History:  Diagnosis Date   AICD (automatic cardioverter/defibrillator) present    Anxiety    Atrial fibrillation (HCC)    CAD (coronary artery disease)    Cardiomyopathy (HCC)    [place]estive [place] 09[place]   Depression    Diabetes mellitus    Erectile dysfunction 11/2019   GI bleeding    H/O right heart catheterization 09/2019   Hypertension    Presence of permanent cardiac pacemaker    Presence of Watchman left atrial appendage closure device 10/27/2021   27 mm Watchman Flex Device per Dr. LQuentin Ore  Schizophrenia ([facility]    Sleep apnea    uses cpap    Past Surgical History:  Procedure Laterality Date   BIOPSY  04/19/2021   Procedure: BIOPSY;  Surgeon: DDoran Stabler MD;  [facility];  Service: Gastroenterology;;   COLONOSCOPY WITH PROPOFOL N/A 04/16/2021   Procedure: COLONOSCOPY WITH PROPOFOL;  Surgeon: BThornton Park MD;  [place] [facility]  Service: Gastroenterology;  Laterality: N/A;   CORONARY STENT PLACEMENT     ENTEROSCOPY N/A 04/08/2020   Procedure: ENTEROSCOPY;  Surgeon: DDoran Stabler MD;  [place]  Service: Gastroenterology;  Laterality: N/A;   ENTEROSCOPY N/A 06/17/2020   Procedure: ENTEROSCOPY;  Surgeon: PIrene Shipper MD;  [facility]  Service: Endoscopy;  Laterality: N/A;   ENTEROSCOPY N/A 04/15/2021   Procedure: ENTEROSCOPY;  Surgeon: BThornton Park MD;  [place]  Service: Gastroenterology;  Laterality: N/A;   ENTEROSCOPY N/A 04/19/2021   Procedure: ENTEROSCOPY;  Surgeon: DDoran Stabler MD;  [place]  Service: Gastroenterology;  Laterality: N/A;   ENTEROSCOPY N/A 07/01/2021   Procedure: ENTEROSCOPY;  Surgeon: PJerene Bears MD;  [facility]  Service: Gastroenterology;  Laterality: N/A;   GIVENS CAPSULE STUDY  04/16/2021   Procedure: GIVENS CAPSULE STUDY;  Surgeon: BThornton Park MD;  [place]  Service: Gastroenterology;;   HEMOSTASIS CLIP PLACEMENT  04/08/2020   Procedure: HEMOSTASIS CLIP PLACEMENT;  Surgeon: DDoran Stabler MD;  [place]  Service: Gastroenterology;;   HEMOSTASIS CLIP PLACEMENT  07/01/2021   Procedure: HEMOSTASIS CLIP PLACEMENT;  Surgeon: PHilarie Fredrickson  Lajuan Lines, MD;  Location: [facility] ENDOSCOPY;  Service: Gastroenterology;;   HEMOSTASIS CONTROL  04/08/2020   Procedure: HEMOSTASIS CONTROL;  Surgeon: Doran Stabler, MD;  Location[facility] ENDOSCOPY;  Service: Gastroenterology;;   HEMOSTASIS CONTROL  06/17/2020   Procedure: HEMOSTASIS CONTROL;  Surgeon: Irene Shipper, MD;  [facility];  Service: Endoscopy;;   HERNIA REPAIR     HOT HEMOSTASIS N/A 04/15/2021   Procedure: HOT HEMOSTASIS (ARGON  PLASMA COAGULATION/BICAP);  Surgeon: [name], MD;  Location: [place];  Service: Gastroenterology;  Laterality: N/A;   HOT HEMOSTASIS N/A 07/01/2021   Procedure: HOT HEMOSTASIS (ARGON PLASMA COAGULATION/BICAP);  Surgeon: Jerene Bears, MD;  Location[facility];  Service: Gastroenterology;  Laterality: N/A;   ICD IMPLANT N/A 09/03/2020   Procedure: ICD IMPLANT;  Surgeon: [name], MD;  Location: [place] CV LAB;  Service: Cardiovascular;  Laterality: N/A;   LEFT ATRIAL APPENDAGE OCCLUSION N/A 10/27/2021   Procedure: LEFT ATRIAL APPENDAGE OCCLUSION;  Surgeon: [name], MD;  Location: [place] CV LAB;  Service: Cardiovascular;  Laterality: N/A;   POLYPECTOMY  04/16/2021   Procedure: POLYPECTOMY;  Surgeon: [name], MD;  Location: [facility] ENDOSCOPY;  Service: Gastroenterology;;   RIGHT/LEFT HEART CATH AND CORONARY ANGIOGRAPHY N/A 10/14/2019   Procedure: RIGHT/LEFT HEART CATH AND CORONARY ANGIOGRAPHY;  Surgeon: Belva Crome, MD;  Location: [place] CV LAB;  Service: Cardiovascular;  Laterality: N/A;   SUBMUCOSAL TATTOO INJECTION  04/19/2021   Procedure: SUBMUCOSAL TATTOO INJECTION;  Surgeon: Doran Stabler, MD;  Location: [place];  Service: Gastroenterology;;   TEE WITHOUT CARDIOVERSION N/A 10/27/2021   Procedure: TRANSESOPHAGEAL ECHOCARDIOGRAM (TEE);  Surgeon: [name], MD;  Location: [place] CV LAB;  Service: Cardiovascular;  Laterality: N/A;   TEE WITHOUT CARDIOVERSION N/A 12/12/2021   Procedure: TRANSESOPHAGEAL ECHOCARDIOGRAM (TEE);  Surgeon: Geralynn Rile, MD;  Location: [place];  Service: Cardiovascular;  Laterality: N/A;    Current Outpatient Medications  Medication Sig Dispense Refill   acetaminophen (TYLENOL) 500 MG tablet Take 2 tablets (1,000 mg total) by mouth 2 (two) times daily as needed. (Patient taking differently: Take 1,000 mg by mouth 2 (two) times daily as needed for mild pain.) 180 tablet 3    albuterol (VENTOLIN HFA) 108 (90 Base) MCG/ACT inhaler Inhale 2 puffs into the lungs every 6 (six) hours as needed for wheezing or shortness of breath. 18 g 1   aspirin EC 81 MG tablet Take 1 tablet (81 mg total) by mouth daily. Swallow whole. 90 tablet 3   atorvastatin (LIPITOR) 40 MG tablet Take 1 tablet (40 mg total) by mouth daily at 6 PM. 90 tablet 1   blood glucose meter kit and supplies KIT Dispense based on patient and insurance preference. Use up to four times daily as directed. (FOR ICD-9 250.00, 250.01). 1 each 0   carvedilol (COREG) 6.25 MG tablet Take 1 tablet (6.25 mg total) by mouth 2 (two) times daily with a meal. 60 tablet 6   colchicine 0.6 MG tablet Daily for 7 days then daily PRN for gout flare 30 tablet 0   Continuous Blood Gluc Receiver (FREESTYLE LIBRE 14 DAY READER) DEVI 1 each by Does not apply route as needed. 1 each 11   Continuous Blood Gluc Sensor (FREESTYLE LIBRE 14 DAY SENSOR) MISC Use one applicator every 14 (fourteen) days. 6 each 3   cyclobenzaprine (FLEXERIL) 10 MG tablet Take 10 mg by mouth 3 (three) times daily as needed for muscle  spasms.     diclofenac Sodium (VOLTAREN) 1 % GEL Apply 2 g topically daily as needed for pain.     empagliflozin (JARDIANCE) 10 MG TABS tablet Take 1 tablet (10 mg total) by mouth daily before breakfast. Please call to schedule follow up [phone] 30 tablet 0   ferrous sulfate 325 (65 FE) MG tablet Take 1 tablet (325 mg total) by mouth every other day. (Patient taking differently: Take 325 mg by mouth every Monday, Wednesday, and Friday.) 30 tablet 1   furosemide (LASIX) 40 MG tablet Take 1.5 tablets (60 mg total) by mouth 2 (two) times daily. 90 tablet 3   gabapentin (NEURONTIN) 800 MG tablet Take 1 tablet (800 mg total) by mouth every 6 (six) hours. 120 tablet 5   glipiZIDE (GLUCOTROL) 10 MG tablet TAKE 1 TABLET BY MOUTH TWICE DAILY BEFORE A MEAL 180 tablet 0   insulin glargine (LANTUS) 100 UNIT/ML injection Inject 0.2 mLs (20 Units  total) into the skin daily. 20 mL 1   Insulin Lispro Prot & Lispro (HUMALOG MIX 75/25 KWIKPEN) (75-25) 100 UNIT/ML Kwikpen Inject 30 Units into the skin 2 (two) times daily. 54 mL 1   Insulin Syringe-Needle U-100 (TRUEPLUS INSULIN SYRINGE) 31G X 5/16" 0.3 ML MISC USE 3 TIMES DAILY TO INJECT INSULIN 300 each 3   metFORMIN (GLUCOPHAGE) 500 MG tablet TAKE 1 TABLET BY MOUTH TWICE DAILY WITH A MEAL 180 tablet 0   metFORMIN (GLUCOPHAGE) 500 MG tablet Take 1 tablet (500 mg total) by mouth 2 (two) times daily with a meal. 60 tablet 2   Multiple Vitamins-Minerals (CENTRUM SILVER 50+MEN) TABS Take 1 tablet by mouth daily.     mupirocin ointment (BACTROBAN) 2 % Apply 1 Application topically 2 (two) times daily. 22 g 0   Oxycodone HCl 10 MG TABS Take 10 mg by mouth 3 (three) times daily.     pantoprazole (PROTONIX) 40 MG tablet Take 1 tablet (40 mg total) by mouth daily. 90 tablet 1   potassium chloride SA (KLOR-CON M) 20 MEQ tablet Take 2 tablets (40 mEq total) by mouth daily. 60 tablet 6   QUEtiapine (SEROQUEL) 300 MG tablet Take 1.5 tablets (450 mg total) by mouth at bedtime. 135 tablet 1   sacubitril-valsartan (ENTRESTO) 24-26 MG Take 1 tablet by mouth 2 (two) times daily. 180 tablet 3   Semaglutide, 2 MG/DOSE, (OZEMPIC, 2 MG/DOSE,) 8 MG/3ML SOPN Inject 2 mg into the skin once a week. Monday (Patient not taking: Reported on 07/18/2022)     spironolactone (ALDACTONE) 25 MG tablet Take 0.5 tablets (12.5 mg total) by mouth at bedtime. 45 tablet 3   tadalafil (CIALIS) 20 MG tablet Take 20 mg by mouth daily as needed for erectile dysfunction.     TRUEPLUS PEN NEEDLES 31G X 6 MM MISC USE AS DIRECTED 100 each 0   No current facility-administered medications for this visit.    Allergies:   Patient has no known allergies.   Social History:  The patient  reports that he has never smoked. He has never been exposed to tobacco smoke. He has never used smokeless tobacco. He reports that he does not currently use  alcohol after a past usage of about 2.0 standard drinks of alcohol per week. He reports that he does not use drugs.   Family History:  The patient's family history includes Heart failure in his mother; Mental illness in his sister and sister.  ROS:  Please see the history of present illness.  All other systems are reviewed and otherwise negative.   PHYSICAL EXAM:  VS:  There were no vitals taken for this visit. BMI: There is no height or weight on file to calculate BMI. Well nourished, well developed, in no acute distress HEENT: normocephalic, atraumatic Neck: no JVD, carotid bruits or masses Cardiac:  *** RRR; no significant murmurs, no rubs, or gallops Lungs:  *** CTA b/l, no wheezing, rhonchi or rales Abd: soft, nontender MS: no deformity or *** atrophy Ext: *** no edema Skin: warm and dry, no rash Neuro:  No gross deficits appreciated Psych: euthymic mood, full affect  *** ICD site is stable, no tethering or discomfort   EKG:  Done today and reviewed by myself shows  ***  Device interrogation done today and reviewed by myself:  ***   TEE 12/12/21 IMPRESSIONS   1. 27 mm Watchman FLX. 2D max diameter 23.5 mm (13% compression). No  device thrombus or device leak. The device is well seated. No left  atrial/left atrial appendage thrombus was detected.   2. Left ventricular ejection fraction, by estimation, is 50 to 55%. The  left ventricle has low normal function. The left ventricle demonstrates  global hypokinesis.   3. Right ventricular systolic function is normal. The right ventricular  size is normal.   4. The mitral valve is grossly normal. Trivial mitral valve  regurgitation. No evidence of mitral stenosis.   5. The aortic valve is tricuspid. There is mild calcification of the  aortic valve. Aortic valve regurgitation is mild.   6. There is mild (Grade II) layered plaque involving the descending  aorta.    10/14/2019: LHC Known severe LV systolic dysfunction by  echo with EF less than 25%.  Normal LVEDP 16 to 20 mmHg and mean pulmonary capillary wedge pressure 16 mmHg. Widely patent LAD First diagonal with severe segmental stenoses.  Relatively small vessel.  Totally occluded second obtuse marginal which has been previously stented.  Fills late by collaterals, sluggishly.  First obtuse marginal is tiny and has proximal 90% stenosis. RCA appears codominant, has an 85 to 90% proximal stenosis, and 60 to 70% proximal stenosis.   RECOMMENDATIONS:   Diffuse coronary disease as outlined above.  The entire LAD is widely patent.  Circumflex is patent but the first obtuse marginal has high-grade stenosis in the previously stented second obtuse marginal is totally occluded.  Seems medical therapy would be appropriate. Aggressive medical therapy for systolic heart failure.    Recent Labs: 09/30/2021: TSH 2.340 06/20/2022: ALT 13 06/23/2022: Magnesium 2.2 06/29/2022: BUN 28; Creatinine, Ser 1.27; Hemoglobin 11.1; Platelets 368; Potassium 3.8; Sodium 142  11/04/2021: Chol/HDL Ratio 4.5; Cholesterol, Total 139; HDL 31; LDL Chol Calc (NIH) 64; Triglycerides 275   CrCl cannot be calculated (Patient's most recent lab result is older than the maximum 21 days allowed.).   Wt Readings from Last 3 Encounters:  07/18/22 (!) 303 lb 9.6 oz (137.7 kg)  07/11/22 (!) 309 lb (140.2 kg)  06/29/22 (!) 310 lb (140.6 kg)     Other studies reviewed: Additional studies/records reviewed today include: summarized above  ASSESSMENT AND PLAN:  ICD ***  Paroxysmal AFib CHA2DS2Vasc is 4, off OAC 2/2 recurrent GIB/AVMs S/p Watchman Dec 2022 ***  CAD ***  Chronic CHF (systolic) Recovered LVEF C/w HF team ***  Disposition: F/u with ***  Current medicines are reviewed at length with the patient today.  The patient did not have any concerns regarding medicines.  Venetia Night, PA-C 07/30/2022 9:36  AM     [facility] [address] [place] [address] [phone] (office)  [phone] (fax)

## 2022-08-01 ENCOUNTER — Encounter: Payer: Self-pay | Admitting: Physician Assistant

## 2022-08-01 ENCOUNTER — Ambulatory Visit: Payer: Medicare Other | Attending: Physician Assistant | Admitting: Physician Assistant

## 2022-08-01 ENCOUNTER — Other Ambulatory Visit (HOSPITAL_COMMUNITY): Payer: Self-pay

## 2022-08-01 VITALS — BP 124/70 | HR 85 | Ht 74.0 in | Wt 303.0 lb

## 2022-08-01 DIAGNOSIS — Z95810 Presence of automatic (implantable) cardiac defibrillator: Secondary | ICD-10-CM

## 2022-08-01 DIAGNOSIS — I5032 Chronic diastolic (congestive) heart failure: Secondary | ICD-10-CM | POA: Diagnosis not present

## 2022-08-01 DIAGNOSIS — I480 Paroxysmal atrial fibrillation: Secondary | ICD-10-CM

## 2022-08-01 DIAGNOSIS — I2510 Atherosclerotic heart disease of native coronary artery without angina pectoris: Secondary | ICD-10-CM

## 2022-08-01 LAB — CUP PACEART INCLINIC DEVICE CHECK
Date Time Interrogation Session: 20230905171507
Implantable Lead Implant Date: 20211008
Implantable Lead Location: 753860
Implantable Lead Model: 436910
Implantable Lead Serial Number: 81404997
Implantable Pulse Generator Implant Date: 20211008
Lead Channel Pacing Threshold Amplitude: 0.4 V
Lead Channel Pacing Threshold Pulse Width: 0.4 ms
Lead Channel Sensing Intrinsic Amplitude: 21.4 mV
Lead Channel Sensing Intrinsic Amplitude: 3.8 mV
Pulse Gen Model: 429525
Pulse Gen Serial Number: 84810752

## 2022-08-01 MED ORDER — METOPROLOL TARTRATE 50 MG PO TABS: 50.0000 mg | ORAL_TABLET | Freq: Two times a day (BID) | ORAL | 3 refills | Status: DC

## 2022-08-01 MED ORDER — METOPROLOL SUCCINATE ER 50 MG PO TB24: 50.0000 mg | ORAL_TABLET | Freq: Two times a day (BID) | ORAL | 2 refills | Status: DC

## 2022-08-01 MED ORDER — METOPROLOL SUCCINATE ER 50 MG PO TB24: 50.0000 mg | ORAL_TABLET | Freq: Every day | ORAL | 2 refills | Status: DC
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
## 2022-08-01 NOTE — Patient Instructions (Addendum)
Medication Instructions:   START TAKING: METOPROLOL SUCCINATE 50 MG TWICE A DAY   STOP TAKING AND REMOVE THIS MEDICATION FROM YOUR MEDICATION LIST:  COREG 6.25 MG    *If you need a refill on your cardiac medications before your next appointment, please call your pharmacy*   Lab Work:   NONE ORDERED  TODAY    If you have labs (blood work) drawn today and your tests are completely normal, you will receive your results only by: [place] (if you have MyChart) OR A paper copy in the mail If you have any lab test that is abnormal or we need to change your treatment, we will call you to review the results.   Testing/Procedures: NONE ORDERED  TODAY    Follow-Up: At [facility], you and your health needs are our priority.  As part of our continuing mission to provide you with exceptional heart care, we have created designated Provider Care Teams.  These Care Teams include your primary Cardiologist (physician) and Advanced Practice Providers (APPs -  Physician Assistants and Nurse Practitioners) who all work together to provide you with the care you need, when you need it.  We recommend signing up for the patient portal called "MyChart".  Sign up information is provided on this After Visit Summary.  MyChart is used to connect with patients for Virtual Visits (Telemedicine).  Patients are able to view lab/test results, encounter notes, upcoming appointments, etc.  Non-urgent messages can be sent to your provider as well.   To learn more about what you can do with MyChart, go to [email or web address].    Your next appointment:   1 month(s)  The format for your next appointment:   In Person  Provider:   Tommye Standard, PA-C    Other Instructions   Important Information About Sugar

## 2022-08-02 NOTE — Telephone Encounter (Signed)
Ozempic pt assistance approved through 11/26/22. LVM to let pt know. Medication has not shipped yet.

## 2022-08-04 ENCOUNTER — Telehealth: Payer: Self-pay

## 2022-08-04 NOTE — Telephone Encounter (Signed)
Received alert from patient's ICD monitoring this morning regarding high rates Sinus Tach versus SVT, rates in 160/s/170/s around 7pm yesterday.    LM for patient to call device clinic.  Need an update on how he is feeling and if he has changed over to the Metoprolol ordered at 08/01/22 OV with Marinus Maw, PA.

## 2022-08-07 ENCOUNTER — Ambulatory Visit (INDEPENDENT_AMBULATORY_CARE_PROVIDER_SITE_OTHER): Payer: Medicare Other | Admitting: Nurse Practitioner

## 2022-08-07 ENCOUNTER — Encounter: Payer: Self-pay | Admitting: Nurse Practitioner

## 2022-08-07 VITALS — BP 111/74 | HR 68 | Temp 98.0°F | Ht 74.0 in | Wt 306.4 lb

## 2022-08-07 DIAGNOSIS — E1165 Type 2 diabetes mellitus with hyperglycemia: Secondary | ICD-10-CM | POA: Diagnosis not present

## 2022-08-07 DIAGNOSIS — Z794 Long term (current) use of insulin: Secondary | ICD-10-CM

## 2022-08-07 NOTE — Patient Instructions (Signed)
1. Type 2 diabetes mellitus with hyperglycemia, with long-term current use of insulin (HCC)  - CBC - Hemoglobin A1c  Follow up:  Follow up in 3 months

## 2022-08-07 NOTE — Assessment & Plan Note (Signed)
-   CBC - Hemoglobin A1c  Follow up:  Follow up in 3 months

## 2022-08-07 NOTE — Progress Notes (Signed)
'@Patient'  ID: [name], male    DOB: [birth year], 61 y.o.   MRN: [id number]  Chief Complaint  Patient presents with   Follow-up    Pt stated he has body pain     Referring provider: Teena Dunk, NP   HPI  [name] 61 y.o. male  has a past medical history of AICD (automatic cardioverter/defibrillator) present, Anxiety, Atrial fibrillation ([place]), CAD (coronary artery disease), Cardiomyopathy ([place]), CHF (congestive heart failure) ([place]) (09/2019), Depression, Diabetes mellitus, Erectile dysfunction (11/2019), GI bleeding, H/O right heart catheterization (09/2019), Hypertension, Presence of permanent cardiac pacemaker, Presence of Watchman left atrial appendage closure device (10/27/2021), Schizophrenia ([place]), and Sleep apnea.  Patient presents today for a follow up visit. He was last seen in our office on 06/29/22 for a hospital follow up. He states that he has been doing well. He has all of his medications and is taking as directed. He followed up with cardiology. He has followed with pulmonary. He does have a CPAP ordered. Patient is currently with [facility] medical for pain management. Overall doing well. Will check labs today including A1C and have patient follow up in 3 months. Denies f/c/s, n/v/d, hemoptysis, PND, leg swelling Denies chest pain or edema    No Known Allergies  Immunization History  Administered Date(s) Administered   Influenza,inj,Quad PF,6+ Mos 10/13/2019, 09/13/2020, 08/03/2021   PFIZER(Purple Top)SARS-COV-2 Vaccination 03/13/2020   Pneumococcal Polysaccharide-23 10/16/2019   Tdap 09/13/2020   Zoster Recombinat (Shingrix) 08/03/2021    Past Medical History:  Diagnosis Date   AICD (automatic cardioverter/defibrillator) present    Anxiety    Atrial fibrillation (HCC)    CAD (coronary artery disease)    Cardiomyopathy ([place])    CHF (congestive heart failure) ([place]) 09/2019   Depression    Diabetes mellitus    Erectile dysfunction 11/2019    GI bleeding    H/O right heart catheterization 09/2019   Hypertension    Presence of permanent cardiac pacemaker    Presence of Watchman left atrial appendage closure device 10/27/2021   27 mm Watchman Flex Device per Dr. Quentin Ore   Schizophrenia [facility])    Sleep apnea    uses cpap    Tobacco History: Social History   Tobacco Use  Smoking Status Never   Passive exposure: Never  Smokeless Tobacco Never   Counseling given: Not Answered   Outpatient Encounter Medications as of 08/07/2022  Medication Sig   acetaminophen (TYLENOL) 500 MG tablet Take 2 tablets (1,000 mg total) by mouth 2 (two) times daily as needed. (Patient taking differently: Take 1,000 mg by mouth 2 (two) times daily as needed for mild pain.)   albuterol (VENTOLIN HFA) 108 (90 Base) MCG/ACT inhaler Inhale 2 puffs into the lungs every 6 (six) hours as needed for wheezing or shortness of breath.   aspirin EC 81 MG tablet Take 1 tablet (81 mg total) by mouth daily. Swallow whole.   atorvastatin (LIPITOR) 40 MG tablet Take 1 tablet (40 mg total) by mouth daily at 6 PM.   blood glucose meter kit and supplies KIT Dispense based on patient and insurance preference. Use up to four times daily as directed. (FOR ICD-9 250.00, 250.01).   colchicine 0.6 MG tablet Daily for 7 days then daily PRN for gout flare   Continuous Blood Gluc Receiver (FREESTYLE LIBRE 14 DAY READER) DEVI 1 each by Does not apply route as needed.   Continuous Blood Gluc Sensor (FREESTYLE LIBRE 14 DAY SENSOR) MISC Use one applicator every 14 (  fourteen) days.   cyclobenzaprine (FLEXERIL) 10 MG tablet Take 10 mg by mouth 3 (three) times daily as needed for muscle spasms.   diclofenac Sodium (VOLTAREN) 1 % GEL Apply 2 g topically daily as needed for pain.   empagliflozin (JARDIANCE) 10 MG TABS tablet Take 1 tablet (10 mg total) by mouth daily before breakfast. Please call to schedule follow up [phone]   ferrous sulfate 325 (65 FE) MG tablet Take 1 tablet  (325 mg total) by mouth every other day. (Patient taking differently: Take 325 mg by mouth every Monday, Wednesday, and Friday.)   furosemide (LASIX) 40 MG tablet Take 1.5 tablets (60 mg total) by mouth 2 (two) times daily.   gabapentin (NEURONTIN) 800 MG tablet Take 1 tablet (800 mg total) by mouth every 6 (six) hours.   glipiZIDE (GLUCOTROL) 10 MG tablet TAKE 1 TABLET BY MOUTH TWICE DAILY BEFORE A MEAL   Insulin Lispro Prot & Lispro (HUMALOG MIX 75/25 KWIKPEN) (75-25) 100 UNIT/ML Kwikpen Inject 30 Units into the skin 2 (two) times daily.   Insulin Syringe-Needle U-100 (TRUEPLUS INSULIN SYRINGE) 31G X 5/16" 0.3 ML MISC USE 3 TIMES DAILY TO INJECT INSULIN   metFORMIN (GLUCOPHAGE) 500 MG tablet TAKE 1 TABLET BY MOUTH TWICE DAILY WITH A MEAL   metFORMIN (GLUCOPHAGE) 500 MG tablet Take 1 tablet (500 mg total) by mouth 2 (two) times daily with a meal.   metoprolol succinate (TOPROL-XL) 50 MG 24 hr tablet Take 1 tablet (50 mg total) by mouth in the morning and at bedtime. Take with or immediately following a meal.   Multiple Vitamins-Minerals (CENTRUM SILVER 50+MEN) TABS Take 1 tablet by mouth daily.   mupirocin ointment (BACTROBAN) 2 % Apply 1 Application topically 2 (two) times daily.   Oxycodone HCl 10 MG TABS Take 10 mg by mouth 3 (three) times daily.   pantoprazole (PROTONIX) 40 MG tablet Take 1 tablet (40 mg total) by mouth daily.   potassium chloride SA (KLOR-CON M) 20 MEQ tablet Take 2 tablets (40 mEq total) by mouth daily.   QUEtiapine (SEROQUEL) 300 MG tablet Take 1.5 tablets (450 mg total) by mouth at bedtime.   sacubitril-valsartan (ENTRESTO) 24-26 MG Take 1 tablet by mouth 2 (two) times daily.   Semaglutide, 2 MG/DOSE, (OZEMPIC, 2 MG/DOSE,) 8 MG/3ML SOPN Inject 2 mg into the skin once a week. Monday   spironolactone (ALDACTONE) 25 MG tablet Take 0.5 tablets (12.5 mg total) by mouth at bedtime.   tadalafil (CIALIS) 20 MG tablet Take 20 mg by mouth daily as needed for erectile dysfunction.    TRUEPLUS PEN NEEDLES 31G X 6 MM MISC USE AS DIRECTED   insulin glargine (LANTUS) 100 UNIT/ML injection Inject 0.2 mLs (20 Units total) into the skin daily.   No facility-administered encounter medications on file as of 08/07/2022.     Review of Systems  Review of Systems  Constitutional: Negative.   HENT: Negative.    Cardiovascular: Negative.   Gastrointestinal: Negative.   Allergic/Immunologic: Negative.   Neurological: Negative.   Psychiatric/Behavioral: Negative.         Physical Exam  BP 111/74 (BP Location: Left Arm, Patient Position: Sitting, Cuff Size: Large)   Pulse 68   Temp 98 F (36.7 C)   Ht '6\' 2"'  (1.88 m)   Wt (!) 306 lb 6.4 oz (139 kg)   SpO2 100%   BMI 39.34 kg/m   Wt Readings from Last 5 Encounters:  08/07/22 (!) 306 lb 6.4 oz (139 kg)  08/01/22 [name] Kitchen)  303 lb (137.4 kg)  08/01/22 (!) 305 lb (138.3 kg)  07/18/22 (!) 303 lb 9.6 oz (137.7 kg)  07/11/22 (!) 309 lb (140.2 kg)     Physical Exam Vitals and nursing note reviewed.  Constitutional:      General: He is not in acute distress.    Appearance: He is well-developed.  Cardiovascular:     Rate and Rhythm: Normal rate and regular rhythm.  Pulmonary:     Effort: Pulmonary effort is normal.     Breath sounds: Normal breath sounds.  Skin:    General: Skin is warm and dry.  Neurological:     Mental Status: He is alert and oriented to person, place, and time.      Lab Results:  CBC    Component Value Date/Time   WBC 8.2 06/29/2022 1438   WBC 8.9 06/21/2022 0438   RBC 5.46 06/29/2022 1438   RBC 4.72 06/21/2022 0438   RBC 4.71 06/21/2022 0438   HGB 11.1 (L) 06/29/2022 1438   HCT 38.5 06/29/2022 1438   PLT 368 06/29/2022 1438   MCV 71 (L) 06/29/2022 1438   MCH 20.3 (L) 06/29/2022 1438   MCH 19.7 (L) 06/21/2022 0438   MCHC 28.8 (L) 06/29/2022 1438   MCHC 27.8 (L) 06/21/2022 0438   RDW 19.7 (H) 06/29/2022 1438   LYMPHSABS 1.3 06/20/2022 1816   LYMPHSABS 1.2 09/01/2020 1020   MONOABS  1.1 (H) 06/20/2022 1816   EOSABS 0.2 06/20/2022 1816   EOSABS 0.2 09/01/2020 1020   BASOSABS 0.0 06/20/2022 1816   BASOSABS 0.0 09/01/2020 1020    BMET    Component Value Date/Time   NA 142 06/29/2022 1438   K 3.8 06/29/2022 1438   CL 102 06/29/2022 1438   CO2 20 06/29/2022 1438   GLUCOSE 185 (H) 06/29/2022 1438   GLUCOSE 152 (H) 06/23/2022 0427   BUN 28 (H) 06/29/2022 1438   CREATININE 1.27 06/29/2022 1438   CREATININE 1.19 03/29/2015 1402   CALCIUM 8.6 06/29/2022 1438   GFRNONAA >60 06/23/2022 0427   GFRAA 96 09/01/2020 1020    BNP    Component Value Date/Time   BNP 103.1 (H) 07/20/2021 1516    ProBNP    Component Value Date/Time   PROBNP 249 (H) 02/23/2021 1315    Imaging: CUP PACEART INCLINIC DEVICE CHECK  Result Date: 08/01/2022 ICD check in clinic. Normal device function. Thresholds and sensing consistent with previous device measurements. Impedance trends stable over time. + NSVT, inappropriate ICD tx for rapid AFib/flutter. Histogram distribution appropriate for patient and level of activity. No changes made this session. Device programmed at appropriate safety margins. Device programmed to optimize intrinsic conduction. Estimated longevity _BOL__. Pt enrolled in remote follow-up.    Assessment & Plan:   Type 2 diabetes mellitus with hyperglycemia, with long-term current use of insulin (HCC) - CBC - Hemoglobin A1c  Follow up:  Follow up in 3 months      Fenton Foy, NP 08/07/2022

## 2022-08-07 NOTE — Telephone Encounter (Signed)
Received another alert today for patient from [employer] monitoring:   2 SVT detected episodes between 08/04/22 and 08/05/22.   RVR 170'S-200  Spoke with patient at 1030 this am (08/07/22).  Patient states that he feels great and has no symptoms.  Noted HR at PCP office this am was 68.  Discussed change to metoprolol with patient as ordered last week by Tommye Standard, PA at clinic appt.  Patient has not started the change yet but will get filled and start tomorrow.  Will forward this to PA to make aware and see if anything further at this point.  I did inform patient that should he develop any emergent/concerning symptoms that he is to go to the ER.  Patient verbalized understanding.

## 2022-08-08 LAB — CBC
Hematocrit: 39.4 % (ref 37.5–51.0)
Hemoglobin: 11.1 g/dL — ABNORMAL LOW (ref 13.0–17.7)
MCH: 20.4 pg — ABNORMAL LOW (ref 26.6–33.0)
MCHC: 28.2 g/dL — ABNORMAL LOW (ref 31.5–35.7)
MCV: 72 fL — ABNORMAL LOW (ref 79–97)
Platelets: 308 10*3/uL (ref 150–450)
RBC: 5.44 x10E6/uL (ref 4.14–5.80)
RDW: 18.6 % — ABNORMAL HIGH (ref 11.6–15.4)
WBC: 7.4 10*3/uL (ref 3.4–10.8)

## 2022-08-08 LAB — HEMOGLOBIN A1C
Est. average glucose Bld gHb Est-mCnc: 174 mg/dL
Hgb A1c MFr Bld: 7.7 % — ABNORMAL HIGH (ref 4.8–5.6)

## 2022-08-09 NOTE — Telephone Encounter (Signed)
Received further SVT alerts today for patient:    2 SVT episodes (one 1 minute and the other 20 secs in duration.    Unable to reach patient but was able to send a voice text message through his phone requesting a call back to our device clinic number.  Need to follow up with patient and ensure he has started his Metoprolol as he hadn't when we last spoke.  Will continue to monitor.

## 2022-08-10 NOTE — Telephone Encounter (Signed)
Outreach made to Pt.  Pt had not started metoprolol yet.  Advised Pt to start TODAY because he continues to have tachycardia episodes.  He states he will start today.  Will continue to  monitor for alerts.

## 2022-08-14 ENCOUNTER — Telehealth: Payer: Self-pay | Admitting: Cardiology

## 2022-08-14 NOTE — Telephone Encounter (Signed)
Pt calling about picking a sleep machine, he states he was supposed to pick it up but no one has called him yet

## 2022-08-15 ENCOUNTER — Other Ambulatory Visit (HOSPITAL_COMMUNITY): Payer: Self-pay

## 2022-08-15 NOTE — Progress Notes (Signed)
Paramedicine Encounter    Patient ID: [name], male    DOB: May 12, 1961, 61 y.o.   MRN: [id number]  Arrived for home visit for [name] who reports to be feeling good. He states that he has been taking his medications over the last two weeks and only missed two doses. He reports he has had no chest pain, dizziness, shortness of breath, or swelling. He reports he is using his [employer] daily- his CBG is 162 today. I obtained vitals as noted: WT- 310lbs BP- 170/90 HR- 70 O2- 99% RR- 16 Lungs- clear No lower leg swelling.   He denied any signs of bleeding. He did note to have low hemoglobin at last labs check. Increasing iron to daily. I placed iron in pill box daily in the morning.   I reviewed all meds. He reports he has been using Hydroxycut for weight loss. I instructed him NOT TO TAKE THIS and he agreed. He will be receiving Ozempic today from [place] office and can start same. He agreed with plan.   I filled pill boxes up for two weeks. Refills as noted: -Lasix -Potassium -Colchicine   He reports he has not received his CPAP equipment- I contacted [facility] where they report they are looking for his chart and would call me back. As of 1700 no call back- I will reach out tomorrow for same.  [phone]   I reviewed upcoming appointments with [name] and verified same. He agreed with same.   He reports no ICD firings since the last time. No new onset of symptoms.  HF and DM education provided.   Home visit complete.   I will follow up in two weeks.   [name], [place] 08/15/2022   Patient Care Team: Teena Dunk, NP as PCP - General (Nurse Practitioner) [name], MD as PCP - Cardiology (Cardiology) [name], MD as PCP - Advanced Heart Failure (Cardiology) [name], MD as PCP - Electrophysiology (Cardiology) [name], LCSW as Social Worker (Licensed Clinical Social Worker) [name], MD  as Consulting Physician (Gastroenterology)  Patient Active Problem List   Diagnosis Date Noted   Obesity (BMI 35.0-39.9 without comorbidity) 07/18/2022   Cellulitis 06/22/2022   Cellulitis, leg 06/20/2022   Presence of Watchman left atrial appendage closure device 10/28/2021   Atrial fibrillation ([place]) 10/27/2021   Melena    Chronic diastolic CHF (congestive heart failure) ([place])    GI bleed 06/29/2021   Severe anemia 06/29/2021   Adenomatous polyp of ascending colon    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   Acute blood loss anemia    Angiodysplasia of stomach    Chronic anticoagulation    CHF exacerbation ([place]) 06/15/2020   AKI (acute kidney injury) ([place]) 06/15/2020   CKD (chronic kidney disease), stage III ([place]) 06/15/2020   Anemia due to chronic blood loss    Gastric AVM    Angiodysplasia of duodenum with hemorrhage    Chronic systolic CHF (congestive heart failure) ([place]) 04/05/2020   Anemia 04/05/2020   PAF (paroxysmal atrial fibrillation) ([place]) 04/05/2020   OSA (obstructive sleep apnea) 04/05/2020   Syncope and collapse 04/05/2020   Type 2 diabetes mellitus with hyperglycemia, with long-term current use of insulin ([place]) 12/03/2019   Diabetic polyneuropathy associated with type 2 diabetes mellitus ([place]) 12/03/2019   Erectile dysfunction 12/03/2019   Paranoid schizophrenia, chronic condition ([place]) 01/26/2015   Severe recurrent major depressive disorder with psychotic features ([place]) 01/26/2015   GAD (generalized  anxiety disorder) 01/26/2015   OCD (obsessive compulsive disorder) 01/26/2015   Panic disorder without agoraphobia 01/26/2015   Insomnia 01/26/2015    Current Outpatient Medications:    acetaminophen (TYLENOL) 500 MG tablet, Take 2 tablets (1,000 mg total) by mouth 2 (two) times daily as needed. (Patient taking differently: Take 1,000 mg by mouth 2 (two) times daily as needed for mild pain.), Disp: 180 tablet, Rfl: 3   albuterol (VENTOLIN  HFA) 108 (90 Base) MCG/ACT inhaler, Inhale 2 puffs into the lungs every 6 (six) hours as needed for wheezing or shortness of breath., Disp: 18 g, Rfl: 1   aspirin EC 81 MG tablet, Take 1 tablet (81 mg total) by mouth daily. Swallow whole., Disp: 90 tablet, Rfl: 3   atorvastatin (LIPITOR) 40 MG tablet, Take 1 tablet (40 mg total) by mouth daily at 6 PM., Disp: 90 tablet, Rfl: 1   blood glucose meter kit and supplies KIT, Dispense based on patient and insurance preference. Use up to four times daily as directed. (FOR ICD-9 250.00, 250.01)., Disp: 1 each, Rfl: 0   colchicine 0.6 MG tablet, Daily for 7 days then daily PRN for gout flare, Disp: 30 tablet, Rfl: 0   Continuous Blood Gluc Receiver (FREESTYLE LIBRE 14 DAY READER) DEVI, 1 each by Does not apply route as needed., Disp: 1 each, Rfl: 11   Continuous Blood Gluc Sensor (FREESTYLE LIBRE 14 DAY SENSOR) MISC, Use one applicator every 14 (fourteen) days., Disp: 6 each, Rfl: 3   cyclobenzaprine (FLEXERIL) 10 MG tablet, Take 10 mg by mouth 3 (three) times daily as needed for muscle spasms., Disp: , Rfl:    diclofenac Sodium (VOLTAREN) 1 % GEL, Apply 2 g topically daily as needed for pain., Disp: , Rfl:    empagliflozin (JARDIANCE) 10 MG TABS tablet, Take 1 tablet (10 mg total) by mouth daily before breakfast. Please call to schedule follow up [phone], Disp: 30 tablet, Rfl: 0   ferrous sulfate 325 (65 FE) MG tablet, Take 1 tablet (325 mg total) by mouth every other day. (Patient taking differently: Take 325 mg by mouth every Monday, Wednesday, and Friday.), Disp: 30 tablet, Rfl: 1   furosemide (LASIX) 40 MG tablet, Take 1.5 tablets (60 mg total) by mouth 2 (two) times daily., Disp: 90 tablet, Rfl: 3   gabapentin (NEURONTIN) 800 MG tablet, Take 1 tablet (800 mg total) by mouth every 6 (six) hours., Disp: 120 tablet, Rfl: 5   glipiZIDE (GLUCOTROL) 10 MG tablet, TAKE 1 TABLET BY MOUTH TWICE DAILY BEFORE A MEAL, Disp: 180 tablet, Rfl: 0   insulin glargine  (LANTUS) 100 UNIT/ML injection, Inject 0.2 mLs (20 Units total) into the skin daily., Disp: 20 mL, Rfl: 1   Insulin Lispro Prot & Lispro (HUMALOG MIX 75/25 KWIKPEN) (75-25) 100 UNIT/ML Kwikpen, Inject 30 Units into the skin 2 (two) times daily., Disp: 54 mL, Rfl: 1   Insulin Syringe-Needle U-100 (TRUEPLUS INSULIN SYRINGE) 31G X 5/16" 0.3 ML MISC, USE 3 TIMES DAILY TO INJECT INSULIN, Disp: 300 each, Rfl: 3   metFORMIN (GLUCOPHAGE) 500 MG tablet, TAKE 1 TABLET BY MOUTH TWICE DAILY WITH A MEAL, Disp: 180 tablet, Rfl: 0   metFORMIN (GLUCOPHAGE) 500 MG tablet, Take 1 tablet (500 mg total) by mouth 2 (two) times daily with a meal., Disp: 60 tablet, Rfl: 2   metoprolol succinate (TOPROL-XL) 50 MG 24 hr tablet, Take 1 tablet (50 mg total) by mouth in the morning and at bedtime. Take with or immediately following a  meal., Disp: 180 tablet, Rfl: 2   Multiple Vitamins-Minerals (CENTRUM SILVER 50+MEN) TABS, Take 1 tablet by mouth daily., Disp: , Rfl:    mupirocin ointment (BACTROBAN) 2 %, Apply 1 Application topically 2 (two) times daily., Disp: 22 g, Rfl: 0   Oxycodone HCl 10 MG TABS, Take 10 mg by mouth 3 (three) times daily., Disp: , Rfl:    pantoprazole (PROTONIX) 40 MG tablet, Take 1 tablet (40 mg total) by mouth daily., Disp: 90 tablet, Rfl: 1   potassium chloride SA (KLOR-CON M) 20 MEQ tablet, Take 2 tablets (40 mEq total) by mouth daily., Disp: 60 tablet, Rfl: 6   QUEtiapine (SEROQUEL) 300 MG tablet, Take 1.5 tablets (450 mg total) by mouth at bedtime., Disp: 135 tablet, Rfl: 1   sacubitril-valsartan (ENTRESTO) 24-26 MG, Take 1 tablet by mouth 2 (two) times daily., Disp: 180 tablet, Rfl: 3   Semaglutide, 2 MG/DOSE, (OZEMPIC, 2 MG/DOSE,) 8 MG/3ML SOPN, Inject 2 mg into the skin once a week. Monday, Disp: , Rfl:    spironolactone (ALDACTONE) 25 MG tablet, Take 0.5 tablets (12.5 mg total) by mouth at bedtime., Disp: 45 tablet, Rfl: 3   tadalafil (CIALIS) 20 MG tablet, Take 20 mg by mouth daily as needed for  erectile dysfunction., Disp: , Rfl:    TRUEPLUS PEN NEEDLES 31G X 6 MM MISC, USE AS DIRECTED, Disp: 100 each, Rfl: 0 No Known Allergies   Social History   Socioeconomic History   Marital status: Legally Separated    Spouse name: Not on file   Number of children: Not on file   Years of education: Not on file   Highest education level: Not on file  Occupational History   Not on file  Tobacco Use   Smoking status: Never    Passive exposure: Never   Smokeless tobacco: Never  Vaping Use   Vaping Use: Never used  Substance and Sexual Activity   Alcohol use: Not Currently    Alcohol/week: 2.0 standard drinks of alcohol    Types: 2 Cans of beer per week   Drug use: No   Sexual activity: Yes    Birth control/protection: None  Other Topics Concern   Not on file  Social History Narrative   Not on file   Social Determinants of Health   Financial Resource Strain: Low Risk  (12/23/2019)   Overall Financial Resource Strain (CARDIA)    Difficulty of Paying Living Expenses: Not very hard  Food Insecurity: No Food Insecurity (07/01/2021)   Hunger Vital Sign    Worried About Running Out of Food in the Last Year: Never true    Ran Out of Food in the Last Year: Never true  Transportation Needs: Unmet Transportation Needs (07/01/2021)   PRAPARE - Hydrologist (Medical): Yes    Lack of Transportation (Non-Medical): Yes  Physical Activity: Not on file  Stress: Not on file  Social Connections: Not on file  Intimate Partner Violence: Not on file    Physical Exam      Future Appointments  Date Time Provider [place]  08/30/2022  3:10 PM Baldwin [place], PA-C [facility]  09/01/2022  7:40 AM [facility] DEVICE REMOTES [facility]  09/29/2022  2:40 PM Fenton Foy, NP [place] None  10/23/2022  2:00 PM Parrett, Fonnie Mu, NP [facility] None  11/06/2022 10:00 AM Fenton Foy, NP [facility] None  12/01/2022  7:40 AM  [facility] DEVICE REMOTES [facility]  03/02/2023  7:40 AM  [facility] DEVICE REMOTES [facility]  06/01/2023  7:40 AM [facility] DEVICE REMOTES [facility]     ACTION: Home visit completed

## 2022-08-15 NOTE — Telephone Encounter (Signed)
Left message for patient to call back.  It looks like his sleep study was read by Dr. Annamaria Boots at [facility]. Patient needs to reach out to them in regards to sleep equipment.

## 2022-08-17 ENCOUNTER — Telehealth (HOSPITAL_COMMUNITY): Payer: Self-pay

## 2022-08-17 NOTE — Telephone Encounter (Signed)
Spoke to [facility] and set [name] up for an appointment for him to pick up his CPAP machine and supplies. He will go on Tuesday September 26th at 11:00. They advised he has no copay on this day but will be billed for $111.54 and then $28.13 per month for 10 months. After this he will own the machine. [facility] is located at [employer]. [name] was made aware of all this and is agreeable to plan. I will call and remind him Monday of his appointment for Tuesday. Call complete.   [name], [place] 08/17/2022

## 2022-08-22 NOTE — Telephone Encounter (Signed)
Spoke to [employer], EMT and she states she "Spoke to [facility] and set [name] up for an appointment for him to pick up his CPAP machine and supplies. He will go on Tuesday September 26th at 11:00." Also since Dr Annamaria Boots read his sleep study [facility] should follow his sleep therapy.

## 2022-08-28 ENCOUNTER — Telehealth: Payer: Self-pay

## 2022-08-28 NOTE — Telephone Encounter (Signed)
Error

## 2022-08-29 ENCOUNTER — Other Ambulatory Visit (HOSPITAL_COMMUNITY): Payer: Self-pay

## 2022-08-29 NOTE — Progress Notes (Signed)
Paramedicine Encounter    Patient ID: [name], male    DOB: 10-10-1961, 61 y.o.   MRN: [id number]   Arrived for home visit for [name] who reports feeling okay today. He denied any recent complaints of shortnes of breath, dizziness, chest pain or leg swelling. His weight is down 4 lbs since our last visit. He is on week two of restarting Ozempic. He denied any complaints of use of same.   He obtained his CPAP and is using it nighty now. He reports he can tell a difference in his energy throughout the day.   He has been compliant with all meds over the last two weeks. I reviewed same and filled pill boxes for two weeks. Refills as noted: Spironolactone Jardiance Atorvastatin Lasix Potassium  Freestyle Sensors  I will call in same to [facility] on [address].   We reviewed upcoming appointments. I wrote down reminders on sticky note and calendar.   He denied any social concerns at present and has no issues to address with SW.   I plan to see [name] in two weeks. Home visit complete.   [name], [place] 08/29/2022    Patient Care Team: Teena Dunk, NP as PCP - General (Nurse Practitioner) [name], MD as PCP - Cardiology (Cardiology) [name], MD as PCP - Advanced Heart Failure (Cardiology) [name], MD as PCP - Electrophysiology (Cardiology) [name], LCSW as Social Worker (Licensed Clinical Social Worker) [name], MD as Consulting Physician (Gastroenterology)  Patient Active Problem List   Diagnosis Date Noted   Obesity (BMI 35.0-39.9 without comorbidity) 07/18/2022   Cellulitis 06/22/2022   Cellulitis, leg 06/20/2022   Presence of Watchman left atrial appendage closure device 10/28/2021   Atrial fibrillation ([place]) 10/27/2021   Melena    Chronic diastolic CHF (congestive heart failure) ([place])    GI bleed 06/29/2021   Severe anemia 06/29/2021   Adenomatous polyp of ascending colon    ICD  (implantable cardioverter-defibrillator) in place 12/08/2020   Acute blood loss anemia    Angiodysplasia of stomach    Chronic anticoagulation    CHF exacerbation ([place]) 06/15/2020   AKI (acute kidney injury) ([place]) 06/15/2020   CKD (chronic kidney disease), stage III ([place]) 06/15/2020   Anemia due to chronic blood loss    Gastric AVM    Angiodysplasia of duodenum with hemorrhage    Chronic systolic CHF (congestive heart failure) ([place]) 04/05/2020   Anemia 04/05/2020   PAF (paroxysmal atrial fibrillation) ([place]) 04/05/2020   OSA (obstructive sleep apnea) 04/05/2020   Syncope and collapse 04/05/2020   Type 2 diabetes mellitus with hyperglycemia, with long-term current use of insulin ([place]) 12/03/2019   Diabetic polyneuropathy associated with type 2 diabetes mellitus ([place]) 12/03/2019   Erectile dysfunction 12/03/2019   Paranoid schizophrenia, chronic condition ([place]) 01/26/2015   Severe recurrent major depressive disorder with psychotic features ([place]) 01/26/2015   GAD (generalized anxiety disorder) 01/26/2015   OCD (obsessive compulsive disorder) 01/26/2015   Panic disorder without agoraphobia 01/26/2015   Insomnia 01/26/2015    Current Outpatient Medications:    acetaminophen (TYLENOL) 500 MG tablet, Take 2 tablets (1,000 mg total) by mouth 2 (two) times daily as needed. (Patient taking differently: Take 1,000 mg by mouth 2 (two) times daily as needed for mild pain.), Disp: 180 tablet, Rfl: 3   albuterol (VENTOLIN HFA) 108 (90 Base) MCG/ACT inhaler, Inhale 2 puffs into the lungs every 6 (six) hours as needed for wheezing or  shortness of breath., Disp: 18 g, Rfl: 1   aspirin EC 81 MG tablet, Take 1 tablet (81 mg total) by mouth daily. Swallow whole., Disp: 90 tablet, Rfl: 3   atorvastatin (LIPITOR) 40 MG tablet, Take 1 tablet (40 mg total) by mouth daily at 6 PM., Disp: 90 tablet, Rfl: 1   blood glucose meter kit and supplies KIT, Dispense based on patient and insurance preference. Use up to  four times daily as directed. (FOR ICD-9 250.00, 250.01)., Disp: 1 each, Rfl: 0   colchicine 0.6 MG tablet, Daily for 7 days then daily PRN for gout flare, Disp: 30 tablet, Rfl: 0   Continuous Blood Gluc Receiver (FREESTYLE LIBRE 14 DAY READER) DEVI, 1 each by Does not apply route as needed., Disp: 1 each, Rfl: 11   Continuous Blood Gluc Sensor (FREESTYLE LIBRE 14 DAY SENSOR) MISC, Use one applicator every 14 (fourteen) days., Disp: 6 each, Rfl: 3   cyclobenzaprine (FLEXERIL) 10 MG tablet, Take 10 mg by mouth 3 (three) times daily as needed for muscle spasms., Disp: , Rfl:    diclofenac Sodium (VOLTAREN) 1 % GEL, Apply 2 g topically daily as needed for pain., Disp: , Rfl:    empagliflozin (JARDIANCE) 10 MG TABS tablet, Take 1 tablet (10 mg total) by mouth daily before breakfast. Please call to schedule follow up [phone], Disp: 30 tablet, Rfl: 0   ferrous sulfate 325 (65 FE) MG tablet, Take 1 tablet (325 mg total) by mouth every other day. (Patient taking differently: Take 325 mg by mouth daily with breakfast.), Disp: 30 tablet, Rfl: 1   furosemide (LASIX) 40 MG tablet, Take 1.5 tablets (60 mg total) by mouth 2 (two) times daily., Disp: 90 tablet, Rfl: 3   gabapentin (NEURONTIN) 800 MG tablet, Take 1 tablet (800 mg total) by mouth every 6 (six) hours., Disp: 120 tablet, Rfl: 5   glipiZIDE (GLUCOTROL) 10 MG tablet, TAKE 1 TABLET BY MOUTH TWICE DAILY BEFORE A MEAL, Disp: 180 tablet, Rfl: 0   insulin glargine (LANTUS) 100 UNIT/ML injection, Inject 0.2 mLs (20 Units total) into the skin daily., Disp: 20 mL, Rfl: 1   Insulin Lispro Prot & Lispro (HUMALOG MIX 75/25 KWIKPEN) (75-25) 100 UNIT/ML Kwikpen, Inject 30 Units into the skin 2 (two) times daily., Disp: 54 mL, Rfl: 1   Insulin Syringe-Needle U-100 (TRUEPLUS INSULIN SYRINGE) 31G X 5/16" 0.3 ML MISC, USE 3 TIMES DAILY TO INJECT INSULIN, Disp: 300 each, Rfl: 3   metFORMIN (GLUCOPHAGE) 500 MG tablet, TAKE 1 TABLET BY MOUTH TWICE DAILY WITH A MEAL,  Disp: 180 tablet, Rfl: 0   metFORMIN (GLUCOPHAGE) 500 MG tablet, Take 1 tablet (500 mg total) by mouth 2 (two) times daily with a meal., Disp: 60 tablet, Rfl: 2   metoprolol succinate (TOPROL-XL) 50 MG 24 hr tablet, Take 1 tablet (50 mg total) by mouth in the morning and at bedtime. Take with or immediately following a meal., Disp: 180 tablet, Rfl: 2   Multiple Vitamins-Minerals (CENTRUM SILVER 50+MEN) TABS, Take 1 tablet by mouth daily., Disp: , Rfl:    mupirocin ointment (BACTROBAN) 2 %, Apply 1 Application topically 2 (two) times daily. (Patient not taking: Reported on 08/15/2022), Disp: 22 g, Rfl: 0   Oxycodone HCl 10 MG TABS, Take 10 mg by mouth 3 (three) times daily., Disp: , Rfl:    pantoprazole (PROTONIX) 40 MG tablet, Take 1 tablet (40 mg total) by mouth daily., Disp: 90 tablet, Rfl: 1   potassium chloride SA (KLOR-CON M)  20 MEQ tablet, Take 2 tablets (40 mEq total) by mouth daily., Disp: 60 tablet, Rfl: 6   QUEtiapine (SEROQUEL) 300 MG tablet, Take 1.5 tablets (450 mg total) by mouth at bedtime., Disp: 135 tablet, Rfl: 1   sacubitril-valsartan (ENTRESTO) 24-26 MG, Take 1 tablet by mouth 2 (two) times daily., Disp: 180 tablet, Rfl: 3   Semaglutide, 2 MG/DOSE, (OZEMPIC, 2 MG/DOSE,) 8 MG/3ML SOPN, Inject 2 mg into the skin once a week. Monday, Disp: , Rfl:    spironolactone (ALDACTONE) 25 MG tablet, Take 0.5 tablets (12.5 mg total) by mouth at bedtime., Disp: 45 tablet, Rfl: 3   tadalafil (CIALIS) 20 MG tablet, Take 20 mg by mouth daily as needed for erectile dysfunction., Disp: , Rfl:    TRUEPLUS PEN NEEDLES 31G X 6 MM MISC, USE AS DIRECTED, Disp: 100 each, Rfl: 0 No Known Allergies   Social History   Socioeconomic History   Marital status: Legally Separated    Spouse name: Not on file   Number of children: Not on file   Years of education: Not on file   Highest education level: Not on file  Occupational History   Not on file  Tobacco Use   Smoking status: Never    Passive  exposure: Never   Smokeless tobacco: Never  Vaping Use   Vaping Use: Never used  Substance and Sexual Activity   Alcohol use: Not Currently    Alcohol/week: 2.0 standard drinks of alcohol    Types: 2 Cans of beer per week   Drug use: No   Sexual activity: Yes    Birth control/protection: None  Other Topics Concern   Not on file  Social History Narrative   Not on file   Social Determinants of Health   Financial Resource Strain: Low Risk  (12/23/2019)   Overall Financial Resource Strain (CARDIA)    Difficulty of Paying Living Expenses: Not very hard  Food Insecurity: No Food Insecurity (07/01/2021)   Hunger Vital Sign    Worried About Running Out of Food in the Last Year: Never true    Ran Out of Food in the Last Year: Never true  Transportation Needs: Unmet Transportation Needs (07/01/2021)   PRAPARE - Hydrologist (Medical): Yes    Lack of Transportation (Non-Medical): Yes  Physical Activity: Not on file  Stress: Not on file  Social Connections: Not on file  Intimate Partner Violence: Not on file    Physical Exam      Future Appointments  Date Time Provider [place]  08/30/2022  3:10 PM Baldwin [place], PA-C [facility]  09/01/2022  7:40 AM [facility] DEVICE REMOTES [facility]  09/29/2022  2:40 PM Fenton Foy, NP [place] None  10/23/2022  2:00 PM Parrett, Fonnie Mu, NP [facility] None  11/06/2022 10:00 AM Fenton Foy, NP [facility] None  12/01/2022  7:40 AM [facility] DEVICE REMOTES [facility]  03/02/2023  7:40 AM [facility] DEVICE REMOTES [facility]  06/01/2023  7:40 AM [facility] DEVICE REMOTES [facility]     ACTION: Home visit completed

## 2022-08-30 ENCOUNTER — Encounter: Payer: Self-pay | Admitting: Physician Assistant

## 2022-08-30 ENCOUNTER — Ambulatory Visit: Payer: Medicare Other | Attending: Physician Assistant | Admitting: Physician Assistant

## 2022-08-30 VITALS — BP 98/60 | HR 77 | Ht 74.0 in | Wt 303.0 lb

## 2022-08-30 DIAGNOSIS — I480 Paroxysmal atrial fibrillation: Secondary | ICD-10-CM

## 2022-08-30 DIAGNOSIS — I5022 Chronic systolic (congestive) heart failure: Secondary | ICD-10-CM

## 2022-08-30 DIAGNOSIS — Z95810 Presence of automatic (implantable) cardiac defibrillator: Secondary | ICD-10-CM

## 2022-08-30 DIAGNOSIS — I2510 Atherosclerotic heart disease of native coronary artery without angina pectoris: Secondary | ICD-10-CM

## 2022-08-30 DIAGNOSIS — I471 Supraventricular tachycardia, unspecified: Secondary | ICD-10-CM

## 2022-08-30 LAB — CUP PACEART INCLINIC DEVICE CHECK
Date Time Interrogation Session: 20231004180605
Implantable Lead Implant Date: 20211008
Implantable Lead Location: 753860
Implantable Lead Model: 436910
Implantable Lead Serial Number: 81404997
Implantable Pulse Generator Implant Date: 20211008
Lead Channel Pacing Threshold Amplitude: 0.5 V
Lead Channel Pacing Threshold Pulse Width: 0.4 ms
Lead Channel Sensing Intrinsic Amplitude: 2.1 mV
Lead Channel Sensing Intrinsic Amplitude: 21.1 mV
Pulse Gen Model: 429525
Pulse Gen Serial Number: 84810752

## 2022-08-30 NOTE — Patient Instructions (Addendum)
Medication Instructions:   Your physician recommends that you continue on your current medications as directed. Please refer to the Current Medication list given to you today.   *If you need a refill on your cardiac medications before your next appointment, please call your pharmacy*   Lab Work: BMET  TODAY    If you have labs (blood work) drawn today and your tests are completely normal, you will receive your results only by: [place] (if you have MyChart) OR A paper copy in the mail If you have any lab test that is abnormal or we need to change your treatment, we will call you to review the results.   Testing/Procedures: NONE ORDERED  TODAY   Follow-Up: At [facility], you and your health needs are our priority.  As part of our continuing mission to provide you with exceptional heart care, we have created designated Provider Care Teams.  These Care Teams include your primary Cardiologist (physician) and Advanced Practice Providers (APPs -  Physician Assistants and Nurse Practitioners) who all work together to provide you with the care you need, when you need it.  We recommend signing up for the patient portal called "MyChart".  Sign up information is provided on this After Visit Summary.  MyChart is used to connect with patients for Virtual Visits (Telemedicine).  Patients are able to view lab/test results, encounter notes, upcoming appointments, etc.  Non-urgent messages can be sent to your provider as well.   To learn more about what you can do with MyChart, go to [email or web address].    Your next appointment:  NEXT AVAILABLE [place]  6 month(s)  The format for your next appointment:   In Person  Provider:   You may see [name], MD or one of the following Advanced Practice Providers on your designated Care Team:   Tommye Standard, [place] Legrand Como "Jonni Sanger" Chalmers Cater, [place]   Other Instructions   Important Information About  [place]

## 2022-08-30 NOTE — Progress Notes (Signed)
Cardiology Office Note Date:  08/30/2022  Patient ID:  Tadao, Emig 12/01/1960, MRN [id number] PCP:  Teena Dunk, NP  Cardiologist:  Dr. Radford Pax AHF: Dr. Aundra Dubin Electrophysiologist: Dr. Quentin Ore    Chief Complaint: planned f/u  History of Present Illness: LAYMAN GULLY is a 61 y.o. male with history of CAD (PCI 2012), chronic CHF, ICM, ICD, schizophrenia, AFib, recurrent GIB > Watchman (Dec 2022), DM, HTN, HLD, OSA w/CPAP.  Record indicates: He can write his name only. He is only able to read a few words. Thinks he completed the 7th grade.  He comes in today to be seen for Dr. Quentin Ore, last seen by him at the time of the [place] implant Dec 2022  Saw HF team APP 12/26/21, doing well, remained off ETOH, insurance declined new sleep study, they were working on this, planned for labs, no changes were made.  More recently saw the structural team APP on 04/26/22, plavix stopped > ASA 43m daily.  No further need for SBE prophylaxis.  I saw him 08/01/22 He feels well The day he got shocked he had no cardiac awareness, no CP. He had been busy that day, had a chest wall trauma a couple days ahead, but no ongoing discomfort He felt "fine" before and after the shock. Has not had any kind of near syncope or syncope. No SOB, denies DOE  He had gotten inappropriate ATP > shock for rapid AFib > did convert him to SR His coreg changed to toprol. AFib burden 9%  Subsequent device alerts fot SVTs, though had not yet made the change to Toprol, advised to make the switch ASAP  Pending CPAP machine, looks like was going to be available 9/26  [facility] VSS, feeling well, weight down 4 lbs on Ozempic, using CPAP every night.  Called his pharmacy with needed refill requests  TODAY He is doing well. Using the CPAP every night He has not had any CP, palpitations or cardiac awareness No SOB No near syncope or syncope. No  shocks  Got on the Toprol about 2 weeks ago   Device information Biotronik single chamber ICD implanted 09/03/2020 (VDD lead)  Past Medical History:  Diagnosis Date   AICD (automatic cardioverter/defibrillator) present    Anxiety    Atrial fibrillation (HCC)    CAD (coronary artery disease)    Cardiomyopathy (HCC)    CHF (congestive heart [place]) [place] 09[place]   Depression    Diabetes mellitus    Erectile dysfunction 11/2019   GI bleeding    H/O right heart catheterization 09/2019   Hypertension    Presence of permanent cardiac pacemaker    Presence of Watchman left atrial appendage closure device 10/27/2021   27 mm Watchman Flex Device per Dr. LQuentin Ore  Schizophrenia ([facility]    Sleep apnea    uses cpap    Past Surgical History:  Procedure Laterality Date   BIOPSY  04/19/2021   Procedure: BIOPSY;  Surgeon: DDoran Stabler MD;  [place]  Service: Gastroenterology;;   COLONOSCOPY WITH PROPOFOL N/A 04/16/2021   Procedure: COLONOSCOPY WITH PROPOFOL;  Surgeon: BThornton Park MD;  [place]  Service: Gastroenterology;  Laterality: N/A;   CORONARY STENT PLACEMENT     ENTEROSCOPY N/A 04/08/2020   Procedure: ENTEROSCOPY;  Surgeon: DDoran Stabler MD;  [place]  Service: Gastroenterology;  Laterality: N/A;   ENTEROSCOPY N/A 06/17/2020   Procedure: ENTEROSCOPY;  Surgeon: Irene Shipper, MD;  Location: [facility] ENDOSCOPY;  Service: Endoscopy;  Laterality: N/A;   ENTEROSCOPY N/A 04/15/2021   Procedure: ENTEROSCOPY;  Surgeon: [name], MD;  Location: [place];  Service: Gastroenterology;  Laterality: N/A;   ENTEROSCOPY N/A 04/19/2021   Procedure: ENTEROSCOPY;  Surgeon: Doran Stabler, MD;  Location: [place];  Service: Gastroenterology;  Laterality: N/A;   ENTEROSCOPY N/A 07/01/2021   Procedure: ENTEROSCOPY;  Surgeon: Jerene Bears, MD;  Location: [facility] ENDOSCOPY;  Service: Gastroenterology;  Laterality: N/A;   GIVENS CAPSULE  STUDY  04/16/2021   Procedure: GIVENS CAPSULE STUDY;  Surgeon: [name], MD;  Location: [place];  Service: Gastroenterology;;   HEMOSTASIS CLIP PLACEMENT  04/08/2020   Procedure: HEMOSTASIS CLIP PLACEMENT;  Surgeon: Doran Stabler, MD;  Location: [place];  Service: Gastroenterology;;   HEMOSTASIS CLIP PLACEMENT  07/01/2021   Procedure: HEMOSTASIS CLIP PLACEMENT;  Surgeon: Jerene Bears, MD;  Location: [place];  Service: Gastroenterology;;   HEMOSTASIS CONTROL  04/08/2020   Procedure: HEMOSTASIS CONTROL;  Surgeon: Doran Stabler, MD;  Location: [facility] ENDOSCOPY;  Service: Gastroenterology;;   HEMOSTASIS CONTROL  06/17/2020   Procedure: HEMOSTASIS CONTROL;  Surgeon: Irene Shipper, MD;  Location: [place];  Service: Endoscopy;;   HERNIA REPAIR     HOT HEMOSTASIS N/A 04/15/2021   Procedure: HOT HEMOSTASIS (ARGON PLASMA COAGULATION/BICAP);  Surgeon: [name], MD;  Location: [place];  Service: Gastroenterology;  Laterality: N/A;   HOT HEMOSTASIS N/A 07/01/2021   Procedure: HOT HEMOSTASIS (ARGON PLASMA COAGULATION/BICAP);  Surgeon: Jerene Bears, MD;  Location: [facility] ENDOSCOPY;  Service: Gastroenterology;  Laterality: N/A;   ICD IMPLANT N/A 09/03/2020   Procedure: ICD IMPLANT;  Surgeon: [name], MD;  Location: [place] CV LAB;  Service: Cardiovascular;  Laterality: N/A;   LEFT ATRIAL APPENDAGE OCCLUSION N/A 10/27/2021   Procedure: LEFT ATRIAL APPENDAGE OCCLUSION;  Surgeon: [name], MD;  Location: [place] CV LAB;  Service: Cardiovascular;  Laterality: N/A;   POLYPECTOMY  04/16/2021   Procedure: POLYPECTOMY;  Surgeon: [name], MD;  Location[facility]COPY;  Service: Gastroenterology;;   RIGHT/LEFT HEART CATH AND CORONARY ANGIOGRAPHY N/A 10/14/2019   Procedure: RIGHT/LEFT HEART CATH AND CORONARY ANGIOGRAPHY;  Surgeon: Belva Crome, MD;  Location: [place] CV LAB;  Service: Cardiovascular;  Laterality: N/A;   SUBMUCOSAL TATTOO  INJECTION  04/19/2021   Procedure: SUBMUCOSAL TATTOO INJECTION;  Surgeon: Doran Stabler, MD;  Location: [place];  Service: Gastroenterology;;   TEE WITHOUT CARDIOVERSION N/A 10/27/2021   Procedure: TRANSESOPHAGEAL ECHOCARDIOGRAM (TEE);  Surgeon: [name], MD;  Location: [place] CV LAB;  Service: Cardiovascular;  Laterality: N/A;   TEE WITHOUT CARDIOVERSION N/A 12/12/2021   Procedure: TRANSESOPHAGEAL ECHOCARDIOGRAM (TEE);  Surgeon: Geralynn Rile, MD;  Location: [place];  Service: Cardiovascular;  Laterality: N/A;    Current Outpatient Medications  Medication Sig Dispense Refill   acetaminophen (TYLENOL) 500 MG tablet Take 2 tablets (1,000 mg total) by mouth 2 (two) times daily as needed. (Patient taking differently: Take 1,000 mg by mouth 2 (two) times daily as needed for mild pain.) 180 tablet 3   albuterol (VENTOLIN HFA) 108 (90 Base) MCG/ACT inhaler Inhale 2 puffs into the lungs every 6 (six) hours as needed for wheezing or shortness of breath. 18 g 1   aspirin EC 81 MG tablet Take 1 tablet (81 mg total) by mouth daily. Swallow whole. 90 tablet 3   atorvastatin (LIPITOR) 40  MG tablet Take 1 tablet (40 mg total) by mouth daily at 6 PM. 90 tablet 1   blood glucose meter kit and supplies KIT Dispense based on patient and insurance preference. Use up to four times daily as directed. (FOR ICD-9 250.00, 250.01). 1 each 0   colchicine 0.6 MG tablet Daily for 7 days then daily PRN for gout flare 30 tablet 0   Continuous Blood Gluc Receiver (FREESTYLE LIBRE 14 DAY READER) DEVI 1 each by Does not apply route as needed. 1 each 11   Continuous Blood Gluc Sensor (FREESTYLE LIBRE 14 DAY SENSOR) MISC Use one applicator every 14 (fourteen) days. 6 each 3   cyclobenzaprine (FLEXERIL) 10 MG tablet Take 10 mg by mouth 3 (three) times daily as needed for muscle spasms.     diclofenac Sodium (VOLTAREN) 1 % GEL Apply 2 g topically daily as needed for pain.     empagliflozin  (JARDIANCE) 10 MG TABS tablet Take 1 tablet (10 mg total) by mouth daily before breakfast. Please call to schedule follow up [phone] 30 tablet 0   ferrous sulfate 325 (65 FE) MG tablet Take 1 tablet (325 mg total) by mouth every other day. (Patient taking differently: Take 325 mg by mouth daily with breakfast.) 30 tablet 1   furosemide (LASIX) 40 MG tablet Take 1.5 tablets (60 mg total) by mouth 2 (two) times daily. 90 tablet 3   gabapentin (NEURONTIN) 800 MG tablet Take 1 tablet (800 mg total) by mouth every 6 (six) hours. 120 tablet 5   glipiZIDE (GLUCOTROL) 10 MG tablet TAKE 1 TABLET BY MOUTH TWICE DAILY BEFORE A MEAL 180 tablet 0   insulin glargine (LANTUS) 100 UNIT/ML injection Inject 0.2 mLs (20 Units total) into the skin daily. 20 mL 1   Insulin Lispro Prot & Lispro (HUMALOG MIX 75/25 KWIKPEN) (75-25) 100 UNIT/ML Kwikpen Inject 30 Units into the skin 2 (two) times daily. 54 mL 1   Insulin Syringe-Needle U-100 (TRUEPLUS INSULIN SYRINGE) 31G X 5/16" 0.3 ML MISC USE 3 TIMES DAILY TO INJECT INSULIN 300 each 3   metFORMIN (GLUCOPHAGE) 500 MG tablet TAKE 1 TABLET BY MOUTH TWICE DAILY WITH A MEAL 180 tablet 0   metFORMIN (GLUCOPHAGE) 500 MG tablet Take 1 tablet (500 mg total) by mouth 2 (two) times daily with a meal. 60 tablet 2   metoprolol succinate (TOPROL-XL) 50 MG 24 hr tablet Take 1 tablet (50 mg total) by mouth in the morning and at bedtime. Take with or immediately following a meal. 180 tablet 2   Multiple Vitamins-Minerals (CENTRUM SILVER 50+MEN) TABS Take 1 tablet by mouth daily.     mupirocin ointment (BACTROBAN) 2 % Apply 1 Application topically 2 (two) times daily. (Patient not taking: Reported on 08/15/2022) 22 g 0   Oxycodone HCl 10 MG TABS Take 10 mg by mouth 3 (three) times daily.     pantoprazole (PROTONIX) 40 MG tablet Take 1 tablet (40 mg total) by mouth daily. 90 tablet 1   potassium chloride SA (KLOR-CON M) 20 MEQ tablet Take 2 tablets (40 mEq total) by mouth daily. 60 tablet  6   QUEtiapine (SEROQUEL) 300 MG tablet Take 1.5 tablets (450 mg total) by mouth at bedtime. 135 tablet 1   sacubitril-valsartan (ENTRESTO) 24-26 MG Take 1 tablet by mouth 2 (two) times daily. 180 tablet 3   Semaglutide, 2 MG/DOSE, (OZEMPIC, 2 MG/DOSE,) 8 MG/3ML SOPN Inject 2 mg into the skin once a week. Monday     spironolactone (ALDACTONE)  25 MG tablet Take 0.5 tablets (12.5 mg total) by mouth at bedtime. 45 tablet 3   tadalafil (CIALIS) 20 MG tablet Take 20 mg by mouth daily as needed for erectile dysfunction.     TRUEPLUS PEN NEEDLES 31G X 6 MM MISC USE AS DIRECTED 100 each 0   No current facility-administered medications for this visit.    Allergies:   Patient has no known allergies.   Social History:  The patient  reports that he has never smoked. He has never been exposed to tobacco smoke. He has never used smokeless tobacco. He reports that he does not currently use alcohol after a past usage of about 2.0 standard drinks of alcohol per week. He reports that he does not use drugs.   Family History:  The patient's family history includes Heart failure in his mother; Mental illness in his sister and sister.  ROS:  Please see the history of present illness.    All other systems are reviewed and otherwise negative.   PHYSICAL EXAM:  VS:  There were no vitals taken for this visit. BMI: There is no height or weight on file to calculate BMI. Well nourished, well developed, in no acute distress HEENT: normocephalic, atraumatic Neck: no JVD, carotid bruits or masses Cardiac:  RRR; no significant murmurs, no rubs, or gallops Lungs:  CTA b/l, no wheezing, rhonchi or rales Abd: soft, nontender MS: no deformity or atrophy Ext: no edema Skin: warm and dry, no rash Neuro:  No gross deficits appreciated Psych: euthymic mood, full affect  ICD site is stable, no tethering or discomfort   EKG:  not done today  Device interrogation done today and reviewed by myself:  Battery and lead  measurements are good Short SVTs, none since the 23rd Longest was 1 minute No VT  TEE 12/12/21 IMPRESSIONS   1. 27 mm Watchman FLX. 2D max diameter 23.5 mm (13% compression). No  device thrombus or device leak. The device is well seated. No left  atrial/left atrial appendage thrombus was detected.   2. Left ventricular ejection fraction, by estimation, is 50 to 55%. The  left ventricle has low normal function. The left ventricle demonstrates  global hypokinesis.   3. Right ventricular systolic function is normal. The right ventricular  size is normal.   4. The mitral valve is grossly normal. Trivial mitral valve  regurgitation. No evidence of mitral stenosis.   5. The aortic valve is tricuspid. There is mild calcification of the  aortic valve. Aortic valve regurgitation is mild.   6. There is mild (Grade II) layered plaque involving the descending  aorta.    10/14/2019: LHC Known severe LV systolic dysfunction by echo with EF less than 25%.  Normal LVEDP 16 to 20 mmHg and mean pulmonary capillary wedge pressure 16 mmHg. Widely patent LAD First diagonal with severe segmental stenoses.  Relatively small vessel.  Totally occluded second obtuse marginal which has been previously stented.  Fills late by collaterals, sluggishly.  First obtuse marginal is tiny and has proximal 90% stenosis. RCA appears codominant, has an 85 to 90% proximal stenosis, and 60 to 70% proximal stenosis.   RECOMMENDATIONS:   Diffuse coronary disease as outlined above.  The entire LAD is widely patent.  Circumflex is patent but the first obtuse marginal has high-grade stenosis in the previously stented second obtuse marginal is totally occluded.  Seems medical therapy would be appropriate. Aggressive medical therapy for systolic heart failure.    Recent Labs: 09/30/2021: TSH 2.340  06/20/2022: ALT 13 06/23/2022: Magnesium 2.2 06/29/2022: BUN 28; Creatinine, Ser 1.27; Potassium 3.8; Sodium 142 08/07/2022:  Hemoglobin 11.1; Platelets 308  11/04/2021: Chol/HDL Ratio 4.5; Cholesterol, Total 139; HDL 31; LDL Chol Calc (NIH) 64; Triglycerides 275   CrCl cannot be calculated (Patient's most recent lab result is older than the maximum 21 days allowed.).   Wt Readings from Last 3 Encounters:  08/29/22 (!) 306 lb (138.8 kg)  08/15/22 (!) 310 lb (140.6 kg)  08/07/22 (!) 306 lb 6.4 oz (139 kg)     Other studies reviewed: Additional studies/records reviewed today include: summarized above  ASSESSMENT AND PLAN:  ICD Intact function No programming changes made  Paroxysmal AFib SVT (1:1 tachycardias on his device noted) CHA2DS2Vasc is 4, off OAC 2/2 recurrent GIB/AVMs S/p Watchman Dec 2022 zero % burden  CPA should help both his Afib and SVT Minimal burden since changed to toprol  CAD No anginal symptoms C/w Dr. Radford Pax  Chronic CHF (systolic) Recovered LVEF C/w HF team No symptoms or exam findings of volume OL   Disposition: f/u with EP in 14mo sooner if needed.  He is over due to see HF team   Current medicines are reviewed at length with the patient today.  The patient did not have any concerns regarding medicines.  SVenetia Night PA-C 08/30/2022 6:04 AM     [place] [phone])  [phone](fax)

## 2022-09-01 ENCOUNTER — Ambulatory Visit (INDEPENDENT_AMBULATORY_CARE_PROVIDER_SITE_OTHER): Payer: Medicare Other

## 2022-09-01 DIAGNOSIS — I5032 Chronic diastolic (congestive) heart failure: Secondary | ICD-10-CM

## 2022-09-05 LAB — CUP PACEART REMOTE DEVICE CHECK
Date Time Interrogation Session: 20231009145329
Implantable Lead Implant Date: 20211008
Implantable Lead Location: 753860
Implantable Lead Model: 436910
Implantable Lead Serial Number: 81404997
Implantable Pulse Generator Implant Date: 20211008
Pulse Gen Model: 429525
Pulse Gen Serial Number: 84810752

## 2022-09-05 NOTE — Progress Notes (Signed)
Remote ICD transmission.   

## 2022-09-06 ENCOUNTER — Other Ambulatory Visit (HOSPITAL_COMMUNITY): Payer: Self-pay | Admitting: Internal Medicine

## 2022-09-06 ENCOUNTER — Other Ambulatory Visit (HOSPITAL_COMMUNITY): Payer: Self-pay | Admitting: Family Medicine

## 2022-09-08 ENCOUNTER — Telehealth: Payer: Self-pay | Admitting: *Deleted

## 2022-09-08 NOTE — Telephone Encounter (Signed)
-----   Message from [facility] [place] sent at 09/05/2022  8:30 AM EDT ----- Looks like he did not get the labs done... Can you follow up with him  ----- Message ----- From: SYSTEM Sent: 09/04/2022  12:13 AM EDT To: [place], PA-C

## 2022-09-08 NOTE — Telephone Encounter (Signed)
Lvm to call back to discuss date and time to get labs drawn

## 2022-09-12 ENCOUNTER — Other Ambulatory Visit (HOSPITAL_COMMUNITY): Payer: Self-pay

## 2022-09-12 NOTE — Progress Notes (Signed)
Paramedicine Encounter    Patient ID: [name], male    DOB: 1961/08/19, 61 y.o.   MRN: [id number]  Arrived for home visit for [name] who reports to be feeling okay but does reports some mild swelling in his ankles, some shortness of breath and weight gain. He missed several evening doses of his Lasix over the last week and reports that he has been eating some high sodium foods over the weekend. I obtained assessment and vitals as noted:  WT- 309lbs (up 6lbs) BP- 160/80 HR- 70 O2- 93%  Lungs- clear Swelling- bilateral lower ankle edema +2 Abdomen distended slightly more than normal.   I reviewed medications and filled pill box for two weeks educating him on importance of med compliance and reminding him about salt and fluid restrictions.   Refills as noted: Freestyle sensor Potassium Lasix  Pantoprazole   We reviewed upcoming appointments. Including labs scheduled for tomorrow.   He agreed with same.   No social concerns at this time.   I plan to see [name] in one week. He agreed to improve on diet and med compliance as well as exercise. Home visit complete.   [name], [place] 09/12/2022   Patient Care Team: Teena Dunk, NP as PCP - General (Nurse Practitioner) [name], MD as PCP - Cardiology (Cardiology) [name], MD as PCP - Advanced Heart Failure (Cardiology) [name], MD as PCP - Electrophysiology (Cardiology) [name], LCSW as Social Worker (Licensed Clinical Social Worker) [name], MD as Consulting Physician (Gastroenterology)  Patient Active Problem List   Diagnosis Date Noted   Obesity (BMI 35.0-39.9 without comorbidity) 07/18/2022   Cellulitis 06/22/2022   Cellulitis, leg 06/20/2022   Presence of Watchman left atrial appendage closure device 10/28/2021   Atrial fibrillation ([place]) 10/27/2021   Melena    Chronic diastolic CHF (congestive heart failure) ([place])    GI bleed  06/29/2021   Severe anemia 06/29/2021   Adenomatous polyp of ascending colon    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   Acute blood loss anemia    Angiodysplasia of stomach    Chronic anticoagulation    CHF exacerbation ([place]) 06/15/2020   AKI (acute kidney injury) ([place]) 06/15/2020   CKD (chronic kidney disease), stage III ([place]) 06/15/2020   Anemia due to chronic blood loss    Gastric AVM    Angiodysplasia of duodenum with hemorrhage    Chronic systolic CHF (congestive heart failure) ([place]) 04/05/2020   Anemia 04/05/2020   PAF (paroxysmal atrial fibrillation) ([place]) 04/05/2020   OSA (obstructive sleep apnea) 04/05/2020   Syncope and collapse 04/05/2020   Type 2 diabetes mellitus with hyperglycemia, with long-term current use of insulin ([place]) 12/03/2019   Diabetic polyneuropathy associated with type 2 diabetes mellitus ([place]) 12/03/2019   Erectile dysfunction 12/03/2019   Paranoid schizophrenia, chronic condition ([place]) 01/26/2015   Severe recurrent major depressive disorder with psychotic features ([place]) 01/26/2015   GAD (generalized anxiety disorder) 01/26/2015   OCD (obsessive compulsive disorder) 01/26/2015   Panic disorder without agoraphobia 01/26/2015   Insomnia 01/26/2015    Current Outpatient Medications:    acetaminophen (TYLENOL) 500 MG tablet, Take 2 tablets (1,000 mg total) by mouth 2 (two) times daily as needed. (Patient taking differently: Take 1,000 mg by mouth 2 (two) times daily as needed for mild pain.), Disp: 180 tablet, Rfl: 3   albuterol (VENTOLIN HFA) 108 (90 Base) MCG/ACT inhaler, Inhale 2 puffs into the lungs every 6 (six) hours  as needed for wheezing or shortness of breath., Disp: 18 g, Rfl: 1   aspirin EC 81 MG tablet, Take 1 tablet (81 mg total) by mouth daily. Swallow whole., Disp: 90 tablet, Rfl: 3   atorvastatin (LIPITOR) 40 MG tablet, Take 1 tablet (40 mg total) by mouth daily. NEEDS FOLLOW UP APPOINTMENT FOR ANYMORE REFILLS, Disp:  90 tablet, Rfl: 0   blood glucose meter kit and supplies KIT, Dispense based on patient and insurance preference. Use up to four times daily as directed. (FOR ICD-9 250.00, 250.01)., Disp: 1 each, Rfl: 0   colchicine 0.6 MG tablet, Daily for 7 days then daily PRN for gout flare, Disp: 30 tablet, Rfl: 0   Continuous Blood Gluc Receiver (FREESTYLE LIBRE 14 DAY READER) DEVI, 1 each by Does not apply route as needed., Disp: 1 each, Rfl: 11   Continuous Blood Gluc Sensor (FREESTYLE LIBRE 14 DAY SENSOR) MISC, Use one applicator every 14 (fourteen) days., Disp: 6 each, Rfl: 3   cyclobenzaprine (FLEXERIL) 10 MG tablet, Take 10 mg by mouth 3 (three) times daily as needed for muscle spasms., Disp: , Rfl:    diclofenac Sodium (VOLTAREN) 1 % GEL, Apply 2 g topically daily as needed for pain., Disp: , Rfl:    empagliflozin (JARDIANCE) 10 MG TABS tablet, Take 1 tablet (10 mg total) by mouth daily. NEEDS FOLLOW UP APPOINTMENT FOR ANYMORE REFILLS, Disp: 60 tablet, Rfl: 0   ferrous sulfate 325 (65 FE) MG tablet, Take 1 tablet (325 mg total) by mouth every other day. (Patient taking differently: Take 325 mg by mouth daily with breakfast.), Disp: 30 tablet, Rfl: 1   furosemide (LASIX) 40 MG tablet, Take 1.5 tablets (60 mg total) by mouth 2 (two) times daily., Disp: 90 tablet, Rfl: 3   gabapentin (NEURONTIN) 800 MG tablet, Take 1 tablet (800 mg total) by mouth every 6 (six) hours., Disp: 120 tablet, Rfl: 5   glipiZIDE (GLUCOTROL) 10 MG tablet, TAKE 1 TABLET BY MOUTH TWICE DAILY BEFORE A MEAL, Disp: 180 tablet, Rfl: 0   insulin glargine (LANTUS) 100 UNIT/ML injection, Inject 0.2 mLs (20 Units total) into the skin daily., Disp: 20 mL, Rfl: 1   Insulin Lispro Prot & Lispro (HUMALOG MIX 75/25 KWIKPEN) (75-25) 100 UNIT/ML Kwikpen, Inject 30 Units into the skin 2 (two) times daily., Disp: 54 mL, Rfl: 1   Insulin Syringe-Needle U-100 (TRUEPLUS INSULIN SYRINGE) 31G X 5/16" 0.3 ML MISC, USE 3 TIMES DAILY TO INJECT INSULIN, Disp:  300 each, Rfl: 3   metFORMIN (GLUCOPHAGE) 500 MG tablet, Take 1 tablet (500 mg total) by mouth 2 (two) times daily with a meal., Disp: 60 tablet, Rfl: 2   metoprolol succinate (TOPROL-XL) 50 MG 24 hr tablet, Take 1 tablet (50 mg total) by mouth in the morning and at bedtime. Take with or immediately following a meal., Disp: 180 tablet, Rfl: 2   Multiple Vitamins-Minerals (CENTRUM SILVER 50+MEN) TABS, Take 1 tablet by mouth daily., Disp: , Rfl:    mupirocin ointment (BACTROBAN) 2 %, Apply 1 Application topically 2 (two) times daily., Disp: 22 g, Rfl: 0   Oxycodone HCl 10 MG TABS, Take 10 mg by mouth 3 (three) times daily., Disp: , Rfl:    pantoprazole (PROTONIX) 40 MG tablet, Take 1 tablet (40 mg total) by mouth daily., Disp: 90 tablet, Rfl: 1   potassium chloride SA (KLOR-CON M) 20 MEQ tablet, Take 2 tablets (40 mEq total) by mouth daily., Disp: 60 tablet, Rfl: 6   QUEtiapine (SEROQUEL)  300 MG tablet, Take 1.5 tablets (450 mg total) by mouth at bedtime., Disp: 135 tablet, Rfl: 1   sacubitril-valsartan (ENTRESTO) 24-26 MG, Take 1 tablet by mouth 2 (two) times daily., Disp: 180 tablet, Rfl: 3   Semaglutide, 2 MG/DOSE, (OZEMPIC, 2 MG/DOSE,) 8 MG/3ML SOPN, Inject 2 mg into the skin once a week. Monday, Disp: , Rfl:    spironolactone (ALDACTONE) 25 MG tablet, Take 0.5 tablets (12.5 mg total) by mouth at bedtime., Disp: 45 tablet, Rfl: 3   tadalafil (CIALIS) 20 MG tablet, Take 20 mg by mouth daily as needed for erectile dysfunction., Disp: , Rfl:    TRUEPLUS PEN NEEDLES 31G X 6 MM MISC, USE AS DIRECTED, Disp: 100 each, Rfl: 0 No Known Allergies   Social History   Socioeconomic History   Marital status: Legally Separated    Spouse name: Not on file   Number of children: Not on file   Years of education: Not on file   Highest education level: Not on file  Occupational History   Not on file  Tobacco Use   Smoking status: Never    Passive exposure: Never   Smokeless tobacco: Never  Vaping Use    Vaping Use: Never used  Substance and Sexual Activity   Alcohol use: Not Currently    Alcohol/week: 2.0 standard drinks of alcohol    Types: 2 Cans of beer per week   Drug use: No   Sexual activity: Yes    Birth control/protection: None  Other Topics Concern   Not on file  Social History Narrative   Not on file   Social Determinants of Health   Financial Resource Strain: Low Risk  (12/23/2019)   Overall Financial Resource Strain (CARDIA)    Difficulty of Paying Living Expenses: Not very hard  Food Insecurity: No Food Insecurity (07/01/2021)   Hunger Vital Sign    Worried About Running Out of Food in the Last Year: Never true    Ran Out of Food in the Last Year: Never true  Transportation Needs: Unmet Transportation Needs (07/01/2021)   PRAPARE - Hydrologist (Medical): Yes    Lack of Transportation (Non-Medical): Yes  Physical Activity: Not on file  Stress: Not on file  Social Connections: Not on file  Intimate Partner Violence: Not on file    Physical Exam      Future Appointments  Date Time Provider [place]  09/29/2022  2:40 PM Fenton Foy, NP [place] None  10/23/2022  2:00 PM Parrett, Fonnie Mu, NP [facility] None  11/06/2022 10:00 AM Fenton Foy, NP [facility] None  12/01/2022  7:40 AM [facility] DEVICE REMOTES [facility]  03/02/2023  7:40 AM [facility] DEVICE REMOTES [facility]  06/01/2023  7:40 AM [facility] DEVICE REMOTES [facility]     ACTION: Home visit completed

## 2022-09-13 ENCOUNTER — Other Ambulatory Visit: Payer: Medicare Other

## 2022-09-18 ENCOUNTER — Telehealth: Payer: Self-pay | Admitting: Pharmacist

## 2022-09-18 NOTE — Progress Notes (Signed)
[facility])  [place] Team    09/18/2022  [name] 1961/01/28 [id number]  Reason for referral: Medication Assistance with Humalog Mix 75-25   Referral source:  2024 Re-Enrollment  Current insurance: [facility]  PMHx includes but not limited to:  Type 2 Diabetes   Outreach:  Successful telephone call with [name].  HIPAA identifiers verified.   Objective: The ASCVD Risk score (Arnett DK, et al., 2019) failed to calculate for the following reasons:   The patient has a prior MI or stroke diagnosis  Lab Results  Component Value Date   CREATININE 1.27 06/29/2022   CREATININE 0.85 06/23/2022   CREATININE 0.90 06/21/2022    Lab Results  Component Value Date   HGBA1C 7.7 (H) 08/07/2022    Lipid Panel     Component Value Date/Time   CHOL 139 11/04/2021 1201   TRIG 275 (H) 11/04/2021 1201   HDL 31 (L) 11/04/2021 1201   CHOLHDL 4.5 11/04/2021 1201   CHOLHDL 3.3 06/06/2021 1444   VLDL 23 06/06/2021 1444   LDLCALC 64 11/04/2021 1201    BP Readings from Last 3 Encounters:  09/12/22 (!) 160/80  08/30/22 98/60  08/29/22 130/80    No Known Allergies  Assessment: Placed initial outreach to [employer], EMT, she confirms that patient is still receiving the Humalog through the [employer] patient assistance program and that patient has the medication in home. [name] assisted [name] with his previous enrollment and reports that she will assist him with coordination again.  Notified [name] that I would outreach [name] and mail application to his home.   Second outreach made to [name], verified that he is interested in re-enrolling with [employer] for the Humalog in 2024. Patient reports that income has not changed and is aware that he will need to send an updated copy of his household income. Verified mailing address, patient was appreciative of my call.   Medication Assistance  Findings:  Medication assistance needs identified: Humalog   Extra Help:  Not eligible for Extra Help Low Income Subsidy based on reported income and assets  Patient Assistance Programs: Humalog made by [employer] requirement met: Yes Out-of-pocket prescription expenditure met:   Not Applicable Patient has not met application requirements to apply for this program at this time.    Plan: I will route patient assistance letter to [place] technician who will coordinate patient assistance program application process for medications listed above.  [facility] pharmacy technician will assist with obtaining all required documents from both patient and provider(s) and submit application(s) once completed.    Loretha Brasil, PharmD [place] Pharmacist Office: [phone]

## 2022-09-19 ENCOUNTER — Telehealth: Payer: Self-pay

## 2022-09-19 ENCOUNTER — Telehealth (HOSPITAL_COMMUNITY): Payer: Self-pay

## 2022-09-19 ENCOUNTER — Ambulatory Visit: Payer: Medicare Other | Attending: Physician Assistant

## 2022-09-19 DIAGNOSIS — Z596 Low income: Secondary | ICD-10-CM

## 2022-09-19 NOTE — Progress Notes (Signed)
[place] [facility])                                            [place] Team    09/19/2022  CHARLTON BOULE 01-08-1961 [id number]                                Medication Assistance Referral  Referral From: [place]  Medication/Company: Humalog / [employer] Patient application portion:  Mailed Provider application portion: Faxed  to [employer] address/fax verified via: Office website  Lelon Huh, [place] [phone]

## 2022-09-19 NOTE — Telephone Encounter (Signed)
Noticed [name] was a no show to his lab appointment last week. I called [facility] to reschedule for today at 1:30. I called [name] to make him aware and he agreed with plan. Call complete.   [name], [place] 09/19/2022

## 2022-09-19 NOTE — Progress Notes (Deleted)
[place] [facility])                                            [place] Team    09/19/2022  JACOBS GOLAB 01-May-1961 [id number]                                      Medication Assistance Referral  Referral From: [place]  Medication/Company: Humalog / Lily Patient application portion:  Mailed Provider application portion: Faxed  to *** Provider address/fax verified via: {Delivery Type:22132}  Medication/Company: *** / *** Patient application portion: {Delivery LPFX:90240} Provider application portion: {Delivery Type:22132} to *** Provider address/fax verified via: {Delivery Type:22132}  Medication/Company: *** / *** Patient application portion: {Delivery XBDZ:32992} Provider application portion: {Delivery Type:22132} to *** Provider address/fax verified via: {Delivery Type:22132}  Medication/Company: *** / *** Patient application portion: {Delivery EQAS:34196} Provider application portion: {Delivery Type:22132} to *** Provider address/fax verified via: {Delivery Type:22132}

## 2022-09-20 LAB — BASIC METABOLIC PANEL
BUN/Creatinine Ratio: 10 (ref 10–24)
BUN: 11 mg/dL (ref 8–27)
CO2: 24 mmol/L (ref 20–29)
Calcium: 9 mg/dL (ref 8.6–10.2)
Chloride: 105 mmol/L (ref 96–106)
Creatinine, Ser: 1.12 mg/dL (ref 0.76–1.27)
Glucose: 116 mg/dL — ABNORMAL HIGH (ref 70–99)
Potassium: 4.4 mmol/L (ref 3.5–5.2)
Sodium: 142 mmol/L (ref 134–144)
eGFR: 74 mL/min/{1.73_m2} (ref >59)

## 2022-09-26 ENCOUNTER — Other Ambulatory Visit (HOSPITAL_COMMUNITY): Payer: Self-pay

## 2022-09-26 NOTE — Progress Notes (Signed)
Paramedicine Encounter    Patient ID: [name], male    DOB: February 27, 1961, 61 y.o.   MRN: [id number]    Arrived for home visit for [name] who reports to not be feeling well today. He says yesterday he started having a sore throat and sinus congestion and today is experiencing general body aches. He denied fever, nausea, vomiting or diarrhea. He denied shortness of breath or trouble breathing, cough. He says he is taking over the counter medications such as Mucinex to help. I reminded him the risks of increased blood pressure with OTC cough and flu meds and suggested some heart safe ones to chose from. He verbalized understanding.   I obtained vitals and assessment.  WT- 311lbs BP- 14/80 HR- 70 RR- 16 O2- 96% CBG- 147  Lungs- clear Edema- NONE   I reviewed medications. He missed three bedtime doses over the last two weeks. I filled two pill boxes for two weeks. Refills as noted: -Pantoprazole  -Metformin -freestyle sensors    He received the application for his humalog with the [employer]illy patient assistance. I got him to sign and I will forward all information to [employer].   I reminded and wrote down reminders of upcoming appointments. He agreed with plans.   I will see [name] in two weeks- he knows to reach out if needed. Home visit complete.   [name], [place] 09/26/2022   Patient Care Team: Teena Dunk, NP as PCP - General (Nurse Practitioner) [name], MD as PCP - Cardiology (Cardiology) [name], MD as PCP - Advanced Heart Failure (Cardiology) [name], MD as PCP - Electrophysiology (Cardiology) [name], LCSW as Social Worker (Licensed Clinical Social Worker) [name], MD as Consulting Physician (Gastroenterology)  Patient Active Problem List   Diagnosis Date Noted   Obesity (BMI 35.0-39.9 without comorbidity) 07/18/2022   Cellulitis 06/22/2022   Cellulitis, leg  06/20/2022   Presence of Watchman left atrial appendage closure device 10/28/2021   Atrial fibrillation ([place]) 10/27/2021   Melena    Chronic diastolic CHF (congestive heart failure) ([place])    GI bleed 06/29/2021   Severe anemia 06/29/2021   Adenomatous polyp of ascending colon    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   Acute blood loss anemia    Angiodysplasia of stomach    Chronic anticoagulation    CHF exacerbation ([place]) 06/15/2020   AKI (acute kidney injury) ([place]) 06/15/2020   CKD (chronic kidney disease), stage III ([place]) 06/15/2020   Anemia due to chronic blood loss    Gastric AVM    Angiodysplasia of duodenum with hemorrhage    Chronic systolic CHF (congestive heart failure) ([place]) 04/05/2020   Anemia 04/05/2020   PAF (paroxysmal atrial fibrillation) ([place]) 04/05/2020   OSA (obstructive sleep apnea) 04/05/2020   Syncope and collapse 04/05/2020   Type 2 diabetes mellitus with hyperglycemia, with long-term current use of insulin ([place]) 12/03/2019   Diabetic polyneuropathy associated with type 2 diabetes mellitus ([place]) 12/03/2019   Erectile dysfunction 12/03/2019   Paranoid schizophrenia, chronic condition ([place]) 01/26/2015   Severe recurrent major depressive disorder with psychotic features ([place]) 01/26/2015   GAD (generalized anxiety disorder) 01/26/2015   OCD (obsessive compulsive disorder) 01/26/2015   Panic disorder without agoraphobia 01/26/2015   Insomnia 01/26/2015    Current Outpatient Medications:    acetaminophen (TYLENOL) 500 MG tablet, Take 2 tablets (1,000 mg total) by mouth 2 (two) times daily as needed. (Patient taking differently: Take 1,000  mg by mouth 2 (two) times daily as needed for mild pain.), Disp: 180 tablet, Rfl: 3   albuterol (VENTOLIN HFA) 108 (90 Base) MCG/ACT inhaler, Inhale 2 puffs into the lungs every 6 (six) hours as needed for wheezing or shortness of breath., Disp: 18 g, Rfl: 1   aspirin EC 81 MG tablet, Take 1 tablet (81  mg total) by mouth daily. Swallow whole., Disp: 90 tablet, Rfl: 3   atorvastatin (LIPITOR) 40 MG tablet, Take 1 tablet (40 mg total) by mouth daily. NEEDS FOLLOW UP APPOINTMENT FOR ANYMORE REFILLS, Disp: 90 tablet, Rfl: 0   blood glucose meter kit and supplies KIT, Dispense based on patient and insurance preference. Use up to four times daily as directed. (FOR ICD-9 250.00, 250.01)., Disp: 1 each, Rfl: 0   colchicine 0.6 MG tablet, Daily for 7 days then daily PRN for gout flare, Disp: 30 tablet, Rfl: 0   Continuous Blood Gluc Receiver (FREESTYLE LIBRE 14 DAY READER) DEVI, 1 each by Does not apply route as needed., Disp: 1 each, Rfl: 11   Continuous Blood Gluc Sensor (FREESTYLE LIBRE 14 DAY SENSOR) MISC, Use one applicator every 14 (fourteen) days., Disp: 6 each, Rfl: 3   cyclobenzaprine (FLEXERIL) 10 MG tablet, Take 10 mg by mouth 3 (three) times daily as needed for muscle spasms., Disp: , Rfl:    diclofenac Sodium (VOLTAREN) 1 % GEL, Apply 2 g topically daily as needed for pain., Disp: , Rfl:    empagliflozin (JARDIANCE) 10 MG TABS tablet, Take 1 tablet (10 mg total) by mouth daily. NEEDS FOLLOW UP APPOINTMENT FOR ANYMORE REFILLS, Disp: 60 tablet, Rfl: 0   ferrous sulfate 325 (65 FE) MG tablet, Take 1 tablet (325 mg total) by mouth every other day. (Patient taking differently: Take 325 mg by mouth daily with breakfast.), Disp: 30 tablet, Rfl: 1   furosemide (LASIX) 40 MG tablet, Take 1.5 tablets (60 mg total) by mouth 2 (two) times daily., Disp: 90 tablet, Rfl: 3   gabapentin (NEURONTIN) 800 MG tablet, Take 1 tablet (800 mg total) by mouth every 6 (six) hours., Disp: 120 tablet, Rfl: 5   glipiZIDE (GLUCOTROL) 10 MG tablet, TAKE 1 TABLET BY MOUTH TWICE DAILY BEFORE A MEAL, Disp: 180 tablet, Rfl: 0   insulin glargine (LANTUS) 100 UNIT/ML injection, Inject 0.2 mLs (20 Units total) into the skin daily., Disp: 20 mL, Rfl: 1   Insulin Lispro Prot & Lispro (HUMALOG MIX 75/25 KWIKPEN) (75-25) 100 UNIT/ML  Kwikpen, Inject 30 Units into the skin 2 (two) times daily., Disp: 54 mL, Rfl: 1   Insulin Syringe-Needle U-100 (TRUEPLUS INSULIN SYRINGE) 31G X 5/16" 0.3 ML MISC, USE 3 TIMES DAILY TO INJECT INSULIN, Disp: 300 each, Rfl: 3   metFORMIN (GLUCOPHAGE) 500 MG tablet, Take 1 tablet (500 mg total) by mouth 2 (two) times daily with a meal., Disp: 60 tablet, Rfl: 2   metoprolol succinate (TOPROL-XL) 50 MG 24 hr tablet, Take 1 tablet (50 mg total) by mouth in the morning and at bedtime. Take with or immediately following a meal., Disp: 180 tablet, Rfl: 2   Multiple Vitamins-Minerals (CENTRUM SILVER 50+MEN) TABS, Take 1 tablet by mouth daily., Disp: , Rfl:    mupirocin ointment (BACTROBAN) 2 %, Apply 1 Application topically 2 (two) times daily., Disp: 22 g, Rfl: 0   Oxycodone HCl 10 MG TABS, Take 10 mg by mouth 3 (three) times daily., Disp: , Rfl:    pantoprazole (PROTONIX) 40 MG tablet, Take 1 tablet (40 mg  total) by mouth daily., Disp: 90 tablet, Rfl: 1   potassium chloride SA (KLOR-CON M) 20 MEQ tablet, Take 2 tablets (40 mEq total) by mouth daily., Disp: 60 tablet, Rfl: 6   QUEtiapine (SEROQUEL) 300 MG tablet, Take 1.5 tablets (450 mg total) by mouth at bedtime., Disp: 135 tablet, Rfl: 1   sacubitril-valsartan (ENTRESTO) 24-26 MG, Take 1 tablet by mouth 2 (two) times daily., Disp: 180 tablet, Rfl: 3   Semaglutide, 2 MG/DOSE, (OZEMPIC, 2 MG/DOSE,) 8 MG/3ML SOPN, Inject 2 mg into the skin once a week. Monday, Disp: , Rfl:    spironolactone (ALDACTONE) 25 MG tablet, Take 0.5 tablets (12.5 mg total) by mouth at bedtime., Disp: 45 tablet, Rfl: 3   tadalafil (CIALIS) 20 MG tablet, Take 20 mg by mouth daily as needed for erectile dysfunction., Disp: , Rfl:    TRUEPLUS PEN NEEDLES 31G X 6 MM MISC, USE AS DIRECTED, Disp: 100 each, Rfl: 0 No Known Allergies   Social History   Socioeconomic History   Marital status: Legally Separated    Spouse name: Not on file   Number of children: Not on file   Years of  education: Not on file   Highest education level: Not on file  Occupational History   Not on file  Tobacco Use   Smoking status: Never    Passive exposure: Never   Smokeless tobacco: Never  Vaping Use   Vaping Use: Never used  Substance and Sexual Activity   Alcohol use: Not Currently    Alcohol/week: 2.0 standard drinks of alcohol    Types: 2 Cans of beer per week   Drug use: No   Sexual activity: Yes    Birth control/protection: None  Other Topics Concern   Not on file  Social History Narrative   Not on file   Social Determinants of Health   Financial Resource Strain: Low Risk  (12/23/2019)   Overall Financial Resource Strain (CARDIA)    Difficulty of Paying Living Expenses: Not very hard  Food Insecurity: No Food Insecurity (07/01/2021)   Hunger Vital Sign    Worried About Running Out of Food in the Last Year: Never true    Ran Out of Food in the Last Year: Never true  Transportation Needs: Unmet Transportation Needs (07/01/2021)   PRAPARE - Hydrologist (Medical): Yes    Lack of Transportation (Non-Medical): Yes  Physical Activity: Not on file  Stress: Not on file  Social Connections: Not on file  Intimate Partner Violence: Not on file    Physical Exam      Future Appointments  Date Time Provider [place]  09/29/2022  2:40 PM Fenton Foy, NP [place] None  10/23/2022  2:00 PM Parrett, Fonnie Mu, NP [facility] None  11/06/2022 10:00 AM Fenton Foy, NP [facility] None  12/01/2022  7:40 AM [facility] DEVICE REMOTES [facility]  03/02/2023  7:40 AM [facility] DEVICE REMOTES [facility]  06/01/2023  7:40 AM [facility] DEVICE REMOTES [facility]     ACTION: Home visit completed

## 2022-09-29 ENCOUNTER — Ambulatory Visit: Payer: Medicare Other | Admitting: Nurse Practitioner

## 2022-10-10 ENCOUNTER — Other Ambulatory Visit: Payer: Self-pay | Admitting: Nurse Practitioner

## 2022-10-10 ENCOUNTER — Other Ambulatory Visit (HOSPITAL_COMMUNITY): Payer: Self-pay

## 2022-10-10 ENCOUNTER — Other Ambulatory Visit (HOSPITAL_COMMUNITY): Payer: Self-pay | Admitting: Family Medicine

## 2022-10-10 NOTE — Progress Notes (Signed)
Paramedicine Encounter    Patient ID: [name], male    DOB: 11-15-1961, 61 y.o.   MRN: [id number]   Arrived for home visit for [facility] where he was cleaning in the kitchen and not appearing to be short of breath while doing so. He reports he is feeling good and denied any complaints of shortness of breath, swelling, weight gain, chest pain or dizziness. He says he has been feeling good. He has been mostly compliant with his meds for the exception of a few bedtime and noon doses. I reminded him the importance of taking all medications. He agreed. I reviewed upcoming appointments and reminded him importance of keeping and maintaining all appointments. He agreed.   I reviewed all meds and confirmed same filling two weeks of medications in his pill boxes.   Refills as noted and called into [facility]: -Lasix -Metformin -Pantoprazole  -Potassium  -Freestyle   Vitals and assessment obtained. No lower leg swelling. Weight down 10lbs from last visit. Lungs clear bilaterally.   Home visit complete. I will see [name] in two weeks.   [name], [place] 10/10/2022     Patient Care Team: Teena Dunk, NP as PCP - General (Nurse Practitioner) [name], MD as PCP - Cardiology (Cardiology) [name], MD as PCP - Advanced Heart Failure (Cardiology) [name], MD as PCP - Electrophysiology (Cardiology) [name], LCSW as Social Worker (Licensed Clinical Social Worker) [name], MD as Consulting Physician (Gastroenterology)  Patient Active Problem List   Diagnosis Date Noted   Obesity (BMI 35.0-39.9 without comorbidity) 07/18/2022   Cellulitis 06/22/2022   Cellulitis, leg 06/20/2022   Presence of Watchman left atrial appendage closure device 10/28/2021   Atrial fibrillation ([place]) 10/27/2021   Melena    Chronic diastolic CHF (congestive heart failure) ([place])    GI bleed 06/29/2021   Severe anemia 06/29/2021    Adenomatous polyp of ascending colon    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   Acute blood loss anemia    Angiodysplasia of stomach    Chronic anticoagulation    CHF exacerbation ([place]) 06/15/2020   AKI (acute kidney injury) ([place]) 06/15/2020   CKD (chronic kidney disease), stage III ([place]) 06/15/2020   Anemia due to chronic blood loss    Gastric AVM    Angiodysplasia of duodenum with hemorrhage    Chronic systolic CHF (congestive heart failure) ([place]) 04/05/2020   Anemia 04/05/2020   PAF (paroxysmal atrial fibrillation) ([place]) 04/05/2020   OSA (obstructive sleep apnea) 04/05/2020   Syncope and collapse 04/05/2020   Type 2 diabetes mellitus with hyperglycemia, with long-term current use of insulin ([place]) 12/03/2019   Diabetic polyneuropathy associated with type 2 diabetes mellitus ([place]) 12/03/2019   Erectile dysfunction 12/03/2019   Paranoid schizophrenia, chronic condition ([place]) 01/26/2015   Severe recurrent major depressive disorder with psychotic features ([place]) 01/26/2015   GAD (generalized anxiety disorder) 01/26/2015   OCD (obsessive compulsive disorder) 01/26/2015   Panic disorder without agoraphobia 01/26/2015   Insomnia 01/26/2015    Current Outpatient Medications:    acetaminophen (TYLENOL) 500 MG tablet, Take 2 tablets (1,000 mg total) by mouth 2 (two) times daily as needed. (Patient taking differently: Take 1,000 mg by mouth 2 (two) times daily as needed for mild pain.), Disp: 180 tablet, Rfl: 3   albuterol (VENTOLIN HFA) 108 (90 Base) MCG/ACT inhaler, Inhale 2 puffs into the lungs every 6 (six) hours as needed for wheezing or shortness of breath., Disp: 18 g,  Rfl: 1   aspirin EC 81 MG tablet, Take 1 tablet (81 mg total) by mouth daily. Swallow whole., Disp: 90 tablet, Rfl: 3   atorvastatin (LIPITOR) 40 MG tablet, Take 1 tablet (40 mg total) by mouth daily. NEEDS FOLLOW UP APPOINTMENT FOR ANYMORE REFILLS, Disp: 90 tablet, Rfl: 0   blood glucose meter  kit and supplies KIT, Dispense based on patient and insurance preference. Use up to four times daily as directed. (FOR ICD-9 250.00, 250.01)., Disp: 1 each, Rfl: 0   colchicine 0.6 MG tablet, Daily for 7 days then daily PRN for gout flare (Patient not taking: Reported on 09/26/2022), Disp: 30 tablet, Rfl: 0   Continuous Blood Gluc Receiver (FREESTYLE LIBRE 14 DAY READER) DEVI, 1 each by Does not apply route as needed., Disp: 1 each, Rfl: 11   cyclobenzaprine (FLEXERIL) 10 MG tablet, Take 10 mg by mouth 3 (three) times daily as needed for muscle spasms., Disp: , Rfl:    diclofenac Sodium (VOLTAREN) 1 % GEL, Apply 2 g topically daily as needed for pain., Disp: , Rfl:    empagliflozin (JARDIANCE) 10 MG TABS tablet, Take 1 tablet (10 mg total) by mouth daily. NEEDS FOLLOW UP APPOINTMENT FOR ANYMORE REFILLS, Disp: 60 tablet, Rfl: 0   ferrous sulfate 325 (65 FE) MG tablet, Take 1 tablet (325 mg total) by mouth every other day. (Patient taking differently: Take 325 mg by mouth daily with breakfast.), Disp: 30 tablet, Rfl: 1   furosemide (LASIX) 40 MG tablet, Take 1.5 tablets (60 mg total) by mouth 2 (two) times daily., Disp: 90 tablet, Rfl: 3   gabapentin (NEURONTIN) 800 MG tablet, Take 1 tablet (800 mg total) by mouth every 6 (six) hours., Disp: 120 tablet, Rfl: 5   glipiZIDE (GLUCOTROL) 10 MG tablet, TAKE 1 TABLET BY MOUTH TWICE DAILY BEFORE A MEAL, Disp: 180 tablet, Rfl: 0   insulin glargine (LANTUS) 100 UNIT/ML injection, Inject 0.2 mLs (20 Units total) into the skin daily., Disp: 20 mL, Rfl: 1   Insulin Lispro Prot & Lispro (HUMALOG MIX 75/25 KWIKPEN) (75-25) 100 UNIT/ML Kwikpen, Inject 30 Units into the skin 2 (two) times daily., Disp: 54 mL, Rfl: 1   Insulin Syringe-Needle U-100 (TRUEPLUS INSULIN SYRINGE) 31G X 5/16" 0.3 ML MISC, USE 3 TIMES DAILY TO INJECT INSULIN, Disp: 300 each, Rfl: 3   metFORMIN (GLUCOPHAGE) 500 MG tablet, Take 1 tablet (500 mg total) by mouth 2 (two) times daily with a meal.,  Disp: 60 tablet, Rfl: 2   metoprolol succinate (TOPROL-XL) 50 MG 24 hr tablet, Take 1 tablet (50 mg total) by mouth in the morning and at bedtime. Take with or immediately following a meal., Disp: 180 tablet, Rfl: 2   Multiple Vitamins-Minerals (CENTRUM SILVER 50+MEN) TABS, Take 1 tablet by mouth daily., Disp: , Rfl:    mupirocin ointment (BACTROBAN) 2 %, Apply 1 Application topically 2 (two) times daily. (Patient not taking: Reported on 09/26/2022), Disp: 22 g, Rfl: 0   Oxycodone HCl 10 MG TABS, Take 10 mg by mouth 3 (three) times daily., Disp: , Rfl:    pantoprazole (PROTONIX) 40 MG tablet, Take 1 tablet (40 mg total) by mouth daily., Disp: 90 tablet, Rfl: 1   potassium chloride SA (KLOR-CON M) 20 MEQ tablet, Take 2 tablets (40 mEq total) by mouth daily., Disp: 60 tablet, Rfl: 6   QUEtiapine (SEROQUEL) 300 MG tablet, Take 1.5 tablets (450 mg total) by mouth at bedtime., Disp: 135 tablet, Rfl: 1   sacubitril-valsartan (ENTRESTO) 24-26  MG, Take 1 tablet by mouth 2 (two) times daily., Disp: 180 tablet, Rfl: 3   Semaglutide, 2 MG/DOSE, (OZEMPIC, 2 MG/DOSE,) 8 MG/3ML SOPN, Inject 2 mg into the skin once a week. Monday, Disp: , Rfl:    spironolactone (ALDACTONE) 25 MG tablet, Take 0.5 tablets (12.5 mg total) by mouth at bedtime., Disp: 45 tablet, Rfl: 3   tadalafil (CIALIS) 20 MG tablet, Take 20 mg by mouth daily as needed for erectile dysfunction., Disp: , Rfl:    TRUEPLUS PEN NEEDLES 31G X 6 MM MISC, USE AS DIRECTED, Disp: 100 each, Rfl: 0 No Known Allergies   Social History   Socioeconomic History   Marital status: Legally Separated    Spouse name: Not on file   Number of children: Not on file   Years of education: Not on file   Highest education level: Not on file  Occupational History   Not on file  Tobacco Use   Smoking status: Never    Passive exposure: Never   Smokeless tobacco: Never  Vaping Use   Vaping Use: Never used  Substance and Sexual Activity   Alcohol use: Not Currently     Alcohol/week: 2.0 standard drinks of alcohol    Types: 2 Cans of beer per week   Drug use: No   Sexual activity: Yes    Birth control/protection: None  Other Topics Concern   Not on file  Social History Narrative   Not on file   Social Determinants of Health   Financial Resource Strain: Low Risk  (12/23/2019)   Overall Financial Resource Strain (CARDIA)    Difficulty of Paying Living Expenses: Not very hard  Food Insecurity: No Food Insecurity (07/01/2021)   Hunger Vital Sign    Worried About Running Out of Food in the Last Year: Never true    Ran Out of Food in the Last Year: Never true  Transportation Needs: Unmet Transportation Needs (07/01/2021)   PRAPARE - Hydrologist (Medical): Yes    Lack of Transportation (Non-Medical): Yes  Physical Activity: Not on file  Stress: Not on file  Social Connections: Not on file  Intimate Partner Violence: Not on file    Physical Exam      Future Appointments  Date Time Provider [place]  10/23/2022  2:00 PM Parrett, Fonnie Mu, NP [facility] None  11/06/2022 10:00 AM Fenton Foy, NP [facility] None  12/01/2022  7:40 AM [facility] DEVICE REMOTES [facility]  03/02/2023  7:40 AM [facility] DEVICE REMOTES [facility]  06/01/2023  7:40 AM [facility] DEVICE REMOTES [facility]     ACTION: Home visit completed

## 2022-10-17 ENCOUNTER — Other Ambulatory Visit: Payer: Self-pay

## 2022-10-17 NOTE — Telephone Encounter (Signed)
Called pt to find out if he is still using the freestyle sensors. [place]

## 2022-10-23 ENCOUNTER — Telehealth (HOSPITAL_COMMUNITY): Payer: Self-pay

## 2022-10-23 ENCOUNTER — Ambulatory Visit: Payer: Medicare Other | Admitting: Adult Health

## 2022-10-23 NOTE — Telephone Encounter (Signed)
[name] called me to report he would need help rescheduling his appointment with [facility] today as it conflicts with his pain management appointment at [facility]. I contacted [facility] and rescheduled same for Dec. 6th at 1030. I will make [name] aware.   Paramedicine home visit scheduled for Wednesday this week and will update him then. Call complete.   [name], [place] 10/23/2022

## 2022-10-25 ENCOUNTER — Telehealth (HOSPITAL_COMMUNITY): Payer: Self-pay

## 2022-10-25 ENCOUNTER — Other Ambulatory Visit (HOSPITAL_COMMUNITY): Payer: Self-pay

## 2022-10-25 MED ORDER — METOLAZONE 2.5 MG PO TABS: 2.5000 mg | ORAL_TABLET | ORAL | 2 refills | Status: DC

## 2022-10-25 NOTE — Telephone Encounter (Signed)
[name], paramedicine called to report that the patients weight is up 9 pounds from when she saw him 2 weeks ago(11/14). He has increased shortness of breath, fatigue. He has been compliant with medications. Per Allena Katz have patient take metolazone 2.'5mg'$  (1 tablet) by mouth with an extra 64mq of potassium. Rx sent to patients pharmacy.  Meds ordered this encounter  Medications   metolazone (ZAROXOLYN) 2.5 MG tablet    Sig: Take 1 tablet (2.5 mg total) by mouth as directed.    Dispense:  5 tablet    Refill:  2

## 2022-10-25 NOTE — Progress Notes (Unsigned)
Paramedicine Encounter    Patient ID: [name], male    DOB: 1961-09-03, 61 y.o.   MRN: [id number]   Patient Care Team: Fenton Foy, NP as PCP - General (Pulmonary Disease) [name], MD as PCP - Cardiology (Cardiology) [name], MD as PCP - Advanced Heart Failure (Cardiology) [name], MD as PCP - Electrophysiology (Cardiology) [name], LCSW as Social Worker (Licensed Clinical Social Worker) [name], MD as Consulting Physician (Gastroenterology)  Patient Active Problem List   Diagnosis Date Noted   Obesity (BMI 35.0-39.9 without comorbidity) 07/18/2022   Cellulitis 06/22/2022   Cellulitis, leg 06/20/2022   Presence of Watchman left atrial appendage closure device 10/28/2021   Atrial fibrillation ([place]) 10/27/2021   Melena    Chronic diastolic CHF (congestive heart failure) ([place])    GI bleed 06/29/2021   Severe anemia 06/29/2021   Adenomatous polyp of ascending colon    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   Acute blood loss anemia    Angiodysplasia of stomach    Chronic anticoagulation    CHF exacerbation ([place]) 06/15/2020   AKI (acute kidney injury) ([place]) 06/15/2020   CKD (chronic kidney disease), stage III ([place]) 06/15/2020   Anemia due to chronic blood loss    Gastric AVM    Angiodysplasia of duodenum with hemorrhage    Chronic systolic CHF (congestive heart failure) ([place]) 04/05/2020   Anemia 04/05/2020   PAF (paroxysmal atrial fibrillation) ([place]) 04/05/2020   OSA (obstructive sleep apnea) 04/05/2020   Syncope and collapse 04/05/2020   Type 2 diabetes mellitus with hyperglycemia, with long-term current use of insulin ([place]) 12/03/2019   Diabetic polyneuropathy associated with type 2 diabetes mellitus ([place]) 12/03/2019   Erectile dysfunction 12/03/2019   Paranoid schizophrenia, chronic condition ([place]) 01/26/2015   Severe recurrent major depressive disorder with psychotic features ([place])  01/26/2015   GAD (generalized anxiety disorder) 01/26/2015   OCD (obsessive compulsive disorder) 01/26/2015   Panic disorder without agoraphobia 01/26/2015   Insomnia 01/26/2015    Current Outpatient Medications:    acetaminophen (TYLENOL) 500 MG tablet, Take 2 tablets (1,000 mg total) by mouth 2 (two) times daily as needed. (Patient taking differently: Take 1,000 mg by mouth 2 (two) times daily as needed for mild pain.), Disp: 180 tablet, Rfl: 3   albuterol (VENTOLIN HFA) 108 (90 Base) MCG/ACT inhaler, Inhale 2 puffs into the lungs every 6 (six) hours as needed for wheezing or shortness of breath., Disp: 18 g, Rfl: 1   aspirin EC 81 MG tablet, Take 1 tablet (81 mg total) by mouth daily. Swallow whole., Disp: 90 tablet, Rfl: 3   atorvastatin (LIPITOR) 40 MG tablet, TAKE 1 TABLET BY MOUTH ONCE DAILY NEEDS  FOLLOW  UP  APPOINTMENT  FOR  ANYMORE  REFILLS, Disp: 30 tablet, Rfl: 0   blood glucose meter kit and supplies KIT, Dispense based on patient and insurance preference. Use up to four times daily as directed. (FOR ICD-9 250.00, 250.01)., Disp: 1 each, Rfl: 0   colchicine 0.6 MG tablet, Daily for 7 days then daily PRN for gout flare (Patient not taking: Reported on 10/10/2022), Disp: 30 tablet, Rfl: 0   Continuous Blood Gluc Receiver (FREESTYLE LIBRE 14 DAY READER) DEVI, 1 each by Does not apply route as needed., Disp: 1 each, Rfl: 11   Continuous Blood Gluc Sensor (FREESTYLE LIBRE 14 DAY SENSOR) MISC, USE 1 APPLICATOR EVERY 14 DAYS, Disp: 2 each, Rfl: 0   cyclobenzaprine (FLEXERIL) 10 MG tablet,  Take 10 mg by mouth 3 (three) times daily as needed for muscle spasms., Disp: , Rfl:    diclofenac Sodium (VOLTAREN) 1 % GEL, Apply 2 g topically daily as needed for pain., Disp: , Rfl:    empagliflozin (JARDIANCE) 10 MG TABS tablet, Take 1 tablet (10 mg total) by mouth daily. NEEDS FOLLOW UP APPOINTMENT FOR ANYMORE REFILLS, Disp: 60 tablet, Rfl: 0   ferrous sulfate 325 (65 FE) MG tablet, Take 1 tablet  (325 mg total) by mouth every other day. (Patient taking differently: Take 325 mg by mouth daily with breakfast.), Disp: 30 tablet, Rfl: 1   furosemide (LASIX) 40 MG tablet, Take 1.5 tablets (60 mg total) by mouth 2 (two) times daily., Disp: 90 tablet, Rfl: 3   gabapentin (NEURONTIN) 800 MG tablet, Take 1 tablet (800 mg total) by mouth every 6 (six) hours., Disp: 120 tablet, Rfl: 5   glipiZIDE (GLUCOTROL) 10 MG tablet, TAKE 1 TABLET BY MOUTH TWICE DAILY BEFORE A MEAL, Disp: 180 tablet, Rfl: 0   insulin glargine (LANTUS) 100 UNIT/ML injection, Inject 0.2 mLs (20 Units total) into the skin daily., Disp: 20 mL, Rfl: 1   Insulin Lispro Prot & Lispro (HUMALOG MIX 75/25 KWIKPEN) (75-25) 100 UNIT/ML Kwikpen, Inject 30 Units into the skin 2 (two) times daily., Disp: 54 mL, Rfl: 1   Insulin Syringe-Needle U-100 (TRUEPLUS INSULIN SYRINGE) 31G X 5/16" 0.3 ML MISC, USE 3 TIMES DAILY TO INJECT INSULIN, Disp: 300 each, Rfl: 3   metFORMIN (GLUCOPHAGE) 500 MG tablet, Take 1 tablet (500 mg total) by mouth 2 (two) times daily with a meal., Disp: 60 tablet, Rfl: 2   metoprolol succinate (TOPROL-XL) 50 MG 24 hr tablet, Take 1 tablet (50 mg total) by mouth in the morning and at bedtime. Take with or immediately following a meal., Disp: 180 tablet, Rfl: 2   Multiple Vitamins-Minerals (CENTRUM SILVER 50+MEN) TABS, Take 1 tablet by mouth daily., Disp: , Rfl:    mupirocin ointment (BACTROBAN) 2 %, Apply 1 Application topically 2 (two) times daily. (Patient not taking: Reported on 09/26/2022), Disp: 22 g, Rfl: 0   Oxycodone HCl 10 MG TABS, Take 10 mg by mouth 3 (three) times daily., Disp: , Rfl:    pantoprazole (PROTONIX) 40 MG tablet, Take 1 tablet (40 mg total) by mouth daily., Disp: 90 tablet, Rfl: 1   potassium chloride SA (KLOR-CON M) 20 MEQ tablet, Take 2 tablets (40 mEq total) by mouth daily., Disp: 60 tablet, Rfl: 6   QUEtiapine (SEROQUEL) 300 MG tablet, Take 1.5 tablets (450 mg total) by mouth at bedtime., Disp: 135  tablet, Rfl: 1   sacubitril-valsartan (ENTRESTO) 24-26 MG, Take 1 tablet by mouth 2 (two) times daily., Disp: 180 tablet, Rfl: 3   Semaglutide, 2 MG/DOSE, (OZEMPIC, 2 MG/DOSE,) 8 MG/3ML SOPN, Inject 2 mg into the skin once a week. Monday, Disp: , Rfl:    spironolactone (ALDACTONE) 25 MG tablet, Take 0.5 tablets (12.5 mg total) by mouth at bedtime., Disp: 45 tablet, Rfl: 3   tadalafil (CIALIS) 20 MG tablet, Take 20 mg by mouth daily as needed for erectile dysfunction., Disp: , Rfl:    TRUEPLUS PEN NEEDLES 31G X 6 MM MISC, USE AS DIRECTED, Disp: 100 each, Rfl: 0 No Known Allergies   Social History   Socioeconomic History   Marital status: Legally Separated    Spouse name: Not on file   Number of children: Not on file   Years of education: Not on file   Highest  education level: Not on file  Occupational History   Not on file  Tobacco Use   Smoking status: Never    Passive exposure: Never   Smokeless tobacco: Never  Vaping Use   Vaping Use: Never used  Substance and Sexual Activity   Alcohol use: Not Currently    Alcohol/week: 2.0 standard drinks of alcohol    Types: 2 Cans of beer per week   Drug use: No   Sexual activity: Yes    Birth control/protection: None  Other Topics Concern   Not on file  Social History Narrative   Not on file   Social Determinants of Health   Financial Resource Strain: Low Risk  (12/23/2019)   Overall Financial Resource Strain (CARDIA)    Difficulty of Paying Living Expenses: Not very hard  Food Insecurity: No Food Insecurity (07/01/2021)   Hunger Vital Sign    Worried About Running Out of Food in the Last Year: Never true    Ran Out of Food in the Last Year: Never true  Transportation Needs: Unmet Transportation Needs (07/01/2021)   PRAPARE - Hydrologist (Medical): Yes    Lack of Transportation (Non-Medical): Yes  Physical Activity: Not on file  Stress: Not on file  Social Connections: Not on file  Intimate Partner  Violence: Not on file    Physical Exam      Future Appointments  Date Time Provider [place]  11/01/2022 10:30 AM Parrett, Fonnie Mu, NP [facility] None  11/06/2022 10:00 AM Fenton Foy, NP [facility] None  12/01/2022  7:40 AM [facility] DEVICE REMOTES [facility]  03/02/2023  7:40 AM [facility] DEVICE REMOTES [facility]  06/01/2023  7:40 AM [facility] DEVICE REMOTES [facility]     ACTION: {Paramed Action:[phone]}

## 2022-10-26 ENCOUNTER — Telehealth (HOSPITAL_COMMUNITY): Payer: Self-pay

## 2022-10-26 ENCOUNTER — Other Ambulatory Visit: Payer: Self-pay

## 2022-10-26 ENCOUNTER — Other Ambulatory Visit: Payer: Self-pay | Admitting: Nurse Practitioner

## 2022-10-26 MED ORDER — GLIPIZIDE 10 MG PO TABS: 10.0000 mg | ORAL_TABLET | Freq: Two times a day (BID) | ORAL | 0 refills | Status: DC

## 2022-10-26 NOTE — Telephone Encounter (Signed)
Attempted to call [name] to verify he picked up and took his Metolazone today- no answer.   I contacted the pharmacy and they report the patient did pick up today.   I plan to follow up in the clinic with him on Monday.      Refills requested: Lantus Seroquel   [name], [place] 10/26/2022

## 2022-10-26 NOTE — Telephone Encounter (Signed)
Ozempic re-enrollment PAP completed and emailed to [facility].   [name], [place] 10/26/2022

## 2022-10-27 ENCOUNTER — Telehealth: Payer: Self-pay | Admitting: Pharmacist

## 2022-10-27 NOTE — Telephone Encounter (Signed)
Called pt to follow up on tolerability of Ozempic. Will need to send in new Rx to pt assistance. Will need to see if Rx should be for '1mg'$  or '2mg'$ .  LVM for pt to call back.

## 2022-10-30 ENCOUNTER — Encounter (HOSPITAL_COMMUNITY): Payer: Self-pay

## 2022-10-30 ENCOUNTER — Other Ambulatory Visit (HOSPITAL_COMMUNITY): Payer: Self-pay

## 2022-10-30 ENCOUNTER — Ambulatory Visit (HOSPITAL_COMMUNITY)
Admission: RE | Admit: 2022-10-30 | Discharge: 2022-10-30 | Disposition: A | Discharge status: Home / Self Care | Payer: Medicare Other | Source: Ambulatory Visit | Attending: Family Medicine | Admitting: Family Medicine

## 2022-10-30 VITALS — BP 110/80 | HR 76 | Wt 310.0 lb

## 2022-10-30 DIAGNOSIS — D649 Anemia, unspecified: Secondary | ICD-10-CM

## 2022-10-30 DIAGNOSIS — F209 Schizophrenia, unspecified: Secondary | ICD-10-CM | POA: Insufficient documentation

## 2022-10-30 DIAGNOSIS — I2510 Atherosclerotic heart disease of native coronary artery without angina pectoris: Secondary | ICD-10-CM | POA: Insufficient documentation

## 2022-10-30 DIAGNOSIS — Z955 Presence of coronary angioplasty implant and graft: Secondary | ICD-10-CM | POA: Diagnosis not present

## 2022-10-30 DIAGNOSIS — I130 Hypertensive heart and chronic kidney disease with heart failure and stage 1 through stage 4 chronic kidney disease, or unspecified chronic kidney disease: Secondary | ICD-10-CM | POA: Insufficient documentation

## 2022-10-30 DIAGNOSIS — Z7984 Long term (current) use of oral hypoglycemic drugs: Secondary | ICD-10-CM | POA: Insufficient documentation

## 2022-10-30 DIAGNOSIS — G4733 Obstructive sleep apnea (adult) (pediatric): Secondary | ICD-10-CM | POA: Insufficient documentation

## 2022-10-30 DIAGNOSIS — Z8719 Personal history of other diseases of the digestive system: Secondary | ICD-10-CM | POA: Diagnosis not present

## 2022-10-30 DIAGNOSIS — E1122 Type 2 diabetes mellitus with diabetic chronic kidney disease: Secondary | ICD-10-CM | POA: Insufficient documentation

## 2022-10-30 DIAGNOSIS — Z79899 Other long term (current) drug therapy: Secondary | ICD-10-CM | POA: Diagnosis not present

## 2022-10-30 DIAGNOSIS — N1831 Chronic kidney disease, stage 3a: Secondary | ICD-10-CM | POA: Insufficient documentation

## 2022-10-30 DIAGNOSIS — E669 Obesity, unspecified: Secondary | ICD-10-CM | POA: Diagnosis not present

## 2022-10-30 DIAGNOSIS — I10 Essential (primary) hypertension: Secondary | ICD-10-CM

## 2022-10-30 DIAGNOSIS — Z6839 Body mass index (BMI) 39.0-39.9, adult: Secondary | ICD-10-CM | POA: Insufficient documentation

## 2022-10-30 DIAGNOSIS — I480 Paroxysmal atrial fibrillation: Secondary | ICD-10-CM | POA: Diagnosis not present

## 2022-10-30 DIAGNOSIS — I5032 Chronic diastolic (congestive) heart failure: Secondary | ICD-10-CM

## 2022-10-30 DIAGNOSIS — I5022 Chronic systolic (congestive) heart failure: Secondary | ICD-10-CM | POA: Insufficient documentation

## 2022-10-30 DIAGNOSIS — I959 Hypotension, unspecified: Secondary | ICD-10-CM | POA: Diagnosis not present

## 2022-10-30 LAB — BASIC METABOLIC PANEL
Anion gap: 10 (ref 5–15)
BUN: 31 mg/dL — ABNORMAL HIGH (ref 8–23)
CO2: 26 mmol/L (ref 22–32)
Calcium: 9.2 mg/dL (ref 8.9–10.3)
Chloride: 101 mmol/L (ref 98–111)
Creatinine, Ser: 1.25 mg/dL — ABNORMAL HIGH (ref 0.61–1.24)
GFR, Estimated: >60 mL/min (ref >60)
Glucose, Bld: 74 mg/dL (ref 70–99)
Potassium: 4.2 mmol/L (ref 3.5–5.1)
Sodium: 137 mmol/L (ref 135–145)

## 2022-10-30 LAB — CBC
HCT: 44.7 % (ref 39.0–52.0)
Hemoglobin: 12.6 g/dL — ABNORMAL LOW (ref 13.0–17.0)
MCH: 22.2 pg — ABNORMAL LOW (ref 26.0–34.0)
MCHC: 28.2 g/dL — ABNORMAL LOW (ref 30.0–36.0)
MCV: 78.7 fL — ABNORMAL LOW (ref 80.0–100.0)
Platelets: 284 10*3/uL (ref 150–400)
RBC: 5.68 MIL/uL (ref 4.22–5.81)
RDW: 20 % — ABNORMAL HIGH (ref 11.5–15.5)
WBC: 6.3 10*3/uL (ref 4.0–10.5)
nRBC: 0 % (ref 0.0–0.2)

## 2022-10-30 LAB — BRAIN NATRIURETIC PEPTIDE: B Natriuretic Peptide: 26.2 pg/mL (ref 0.0–100.0)

## 2022-10-30 LAB — IRON AND TIBC
Iron: 188 ug/dL — ABNORMAL HIGH (ref 45–182)
Saturation Ratios: 41 % — ABNORMAL HIGH (ref 17.9–39.5)
TIBC: 458 ug/dL — ABNORMAL HIGH (ref 250–450)
UIBC: 270 ug/dL

## 2022-10-30 LAB — FERRITIN: Ferritin: 16 ng/mL — ABNORMAL LOW (ref 24–336)

## 2022-10-30 NOTE — Patient Instructions (Signed)
Labs done today. We will contact you only if your labs are abnormal.  No medication changes were made. Please continue all current medications as prescribed.  Your physician recommends that you schedule a follow-up appointment in: 3-4 months with Dr. Aundra Dubin  If you have any questions or concerns before your next appointment please send [place] a message through [facility] or call our office at [phone].    TO LEAVE A MESSAGE FOR THE NURSE SELECT OPTION 2, PLEASE LEAVE A MESSAGE INCLUDING: YOUR NAME DATE OF BIRTH CALL BACK NUMBER REASON FOR CALL**this is important as we prioritize the call backs  YOU WILL RECEIVE A CALL BACK THE SAME DAY AS LONG AS YOU CALL BEFORE 4:00 PM   Do the following things EVERYDAY: Weigh yourself in the morning before breakfast. Write it down and keep it in a log. Take your medicines as prescribed Eat low salt foods--Limit salt (sodium) to 2000 mg per day.  Stay as active as you can everyday Limit all fluids for the day to less than 2 liters   At the [facility], you and your health needs are our priority. As part of our continuing mission to provide you with exceptional heart care, we have created designated Provider Care Teams. These Care Teams include your primary Cardiologist (physician) and Advanced Practice Providers (APPs- Physician Assistants and Nurse Practitioners) who all work together to provide you with the care you need, when you need it.   You may see any of the following providers on your designated Care Team at your next follow up: Dr Glori Bickers Dr Haynes Kerns, NP Lyda Jester, [place] [name], PharmD   Please be sure to bring in all your medications bottles to every appointment.

## 2022-10-30 NOTE — Progress Notes (Signed)
ADVANCED HEART FAILURE CLINIC NOTE  PCP: Fenton Foy, NP Cardiology: Dr. Radford Pax HF Cardiology: Dr. Aundra Dubin  61 y.o. with history of chronic systolic CHF, CAD, type 2 diabetes, paroxysmal atrial fibrillation, and schizophrenia was referred by Dr. Radford Pax for evaluation of CHF.  Patient had OM2 PCI in 2012.  In 11/20, he was admitted with CHF. Echo showed EF 25-30% with diffuse hypokinesis.  LHC was done, showing occluded OM2 at prior stent, 90% D1 stenosis, and extensive diffuse RCA disease.  No intervention.  He was thought to be in paroxysmal atrial fibrillation during this appointment and apixaban was started.   He does not smoke, rarely drinks, and does not use drugs.  His mother had "heart problems."    He can write his name only. He is only able to read a few words. Thinks he completed the 7th grade.    Echo in 5/21 showed EF 30% with diffuse hypokinesis, mild LVH, PASP 38, mildly decreased RV systolic function, IVC dilated. Biotronik ICD placed.   He was hospitalized in 7/21 with upper GI bleeding and CHF exacerbation.  EGD showed duodenal AVMs, treated with APC. He was diuresed.   Clinic visit 5/22 he was volume overloaded, weight up 19 lbs, and diuretics increased and Farxiga started.   He was admitted to [facility] a couple weeks later (04/13/21) for a/c CHF. He was diuresed with IV lasix. Hospitalization complicated by GIB & AKI. Enteroscopy performed on 5/20 which showed 2 small angioectasias with no bleeding in the third portion of the duodenum. Colonoscopy on 5/21, showed multiple polyps which were biopsied. No source of bleeding found. He had pill endoscopy 5/22 which showed non bleeding AVM. Pt had repeat enteroscopy which showed non bleeding jejunal ulcer. Eliquis held during admission but restarted at discharge. Home hydralazine, spiro, Entresto, and Imdur stopped in setting of GIB; carvedilol dose decreased at discharge.  He returned 6/22 for post hospitalization HF follow up. Up 10  lbs per paramedicine. NYHA II symptoms. Entresto restarted and lasix increased.  Admitted 7/22 for chest pain, found to have hgb of 4.7. He received 2 U PRBCs, Eliquis was held and GI & AHF were consulted for further management. Farxiga and spiro held with increase in kidney function. Repeat echo on 06/30/21 EF 50-55%, mildly dilated LV, mild LVH, RV okay, dilated IVC with estimated RA pressure of 8. He underwent EGD on 07/01/21 showing three non-bleeding angioectasias in the duodenum. Treated with argon plasma coagulation (APC). GDMT therapy added back as SCr improved. Eliquis restarted and arrangement made for referral for Watchman device consideration. Day of discharge weight 324 lbs, SCr 1.29 and hgb 8.3.   S/p Watchman 10/27/21.   Last seen 11/2021.   Device showed ATP (07/27/22) >>inappropriate shock for rapid AF/AFL, did convert to NSR. His Coreg was changed to Toprol at EP follow up.    Today he returns for HF follow up with [facility] with paramedicine. Overall feeling fine. He has shortness of breath if he pushes himself to walk further distances on flat ground. Denies palpitations, CP, dizziness, edema, or PND/Orthopnea. Appetite ok. No fever or chills. Weight at home 310 pounds. Taking all medications. Remains off ETOH/drugs/tobacco. Took metolazone last week for weight gain and SOB, this seemed to help. Wears CPAP 3-4x/week.  ECG (personally reviewed): none ordered today.  Labs (1/21): LDL 66, HDL 42, hgb 11.3, K 4.7, creatinine 1.32 Labs (4/21): K 5, creatinine 1.37 Labs (7/21): K 4.1, creatinine 1.26, hgb 9.3 Labs (8/21): LDL 51, K  3.8, creatinine 1.3 Labs (11/21): K 4, creatinine 1.42, hgb 13.1, hgbA1c 11.7 Labs (2/22): HgbA1c 7.7 Labs (3/22): K 4.4, creatinine 1.27, pro-BNP 249. Labs (5/22): K 4.4, creatinine 1.22 Labs (8/22): K 4.1, creatinine 1.00, hgb 8.3 Labs (12/22): K 4.7, creatinine 1.13 Labs (10/23): K 4.4, creatinine 1.12  PMH:  1. Atrial fibrillation: Paroxysmal - s/p  LAAO device (12/22) 2. Type 2 diabetes 3. HTN 4. Hyperlipidemia 5. Schizophrenia 6. CAD: PCI OM2 in 2012.  - LHC (11/20): 90% D1 stenosis, totally occluded OM2 at stent, serial 85%/70%/60% RCA stenoses.  7. 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  Biotronik ICD.  - Echo (11/20): EF 25-30%, global hypokinesis.  - Echo (5/21): EF 30% with diffuse hypokinesis, mild LVH, PASP 38, mildly decreased RV systolic function, IVC dilated. - TEE (1/23): EF 50-55%, global LV HK, normal RV 8. Upper GI bleeding: 7/21, duodenal AVMs treated with APC.  9. OSA: Does not use CPAP regularly.   Social History   Socioeconomic History   Marital status: Legally Separated    Spouse name: Not on file   Number of children: Not on file   Years of education: Not on file   Highest education level: Not on file  Occupational History   Not on file  Tobacco Use   Smoking status: Never    Passive exposure: Never   Smokeless tobacco: Never  Vaping Use   Vaping Use: Never used  Substance and Sexual Activity   Alcohol use: Not Currently    Alcohol/week: 2.0 standard drinks of alcohol    Types: 2 Cans of beer per week   Drug use: No   Sexual activity: Yes    Birth control/protection: None  Other Topics Concern   Not on file  Social History Narrative   Not on file   Social Determinants of Health   Financial Resource Strain: Low Risk  (12/23/2019)   Overall Financial Resource Strain (CARDIA)    Difficulty of Paying Living Expenses: Not very hard  Food Insecurity: No Food Insecurity (07/01/2021)   Hunger Vital Sign    Worried About Running Out of Food in the Last Year: Never true    Ran Out of Food in the Last Year: Never true  Transportation Needs: Unmet Transportation Needs (07/01/2021)   PRAPARE - Hydrologist (Medical): Yes    Lack of Transportation (Non-Medical): Yes  Physical Activity: Not on file  Stress: Not on file  Social Connections:  Not on file  Intimate Partner Violence: Not on file   Family History  Problem Relation Age of Onset   Heart failure Mother    Mental illness Sister    Mental illness Sister    ROS: All systems reviewed and negative except as per HPI.  Current Meds  Medication Sig   acetaminophen (TYLENOL) 500 MG tablet Take 2 tablets (1,000 mg total) by mouth 2 (two) times daily as needed. (Patient taking differently: Take 1,000 mg by mouth 2 (two) times daily as needed for mild pain.)   albuterol (VENTOLIN HFA) 108 (90 Base) MCG/ACT inhaler Inhale 2 puffs into the lungs every 6 (six) hours as needed for wheezing or shortness of breath.   aspirin EC 81 MG tablet Take 1 tablet (81 mg total) by mouth daily. Swallow whole.   atorvastatin (LIPITOR) 40 MG tablet TAKE 1 TABLET BY MOUTH ONCE DAILY NEEDS  FOLLOW  UP  APPOINTMENT  FOR  ANYMORE  REFILLS   blood glucose meter kit and  supplies KIT Dispense based on patient and insurance preference. Use up to four times daily as directed. (FOR ICD-9 250.00, 250.01).   Continuous Blood Gluc Receiver (FREESTYLE LIBRE 14 DAY READER) DEVI 1 each by Does not apply route as needed.   Continuous Blood Gluc Sensor (FREESTYLE LIBRE 14 DAY SENSOR) MISC USE 1 APPLICATOR EVERY 14 DAYS   cyclobenzaprine (FLEXERIL) 10 MG tablet Take 10 mg by mouth 3 (three) times daily as needed for muscle spasms.   diclofenac Sodium (VOLTAREN) 1 % GEL Apply 2 g topically daily as needed for pain.   empagliflozin (JARDIANCE) 10 MG TABS tablet Take 1 tablet (10 mg total) by mouth daily. NEEDS FOLLOW UP APPOINTMENT FOR ANYMORE REFILLS   ferrous sulfate 325 (65 FE) MG tablet Take 1 tablet (325 mg total) by mouth every other day. (Patient taking differently: Take 325 mg by mouth daily with breakfast.)   furosemide (LASIX) 40 MG tablet Take 1.5 tablets (60 mg total) by mouth 2 (two) times daily.   gabapentin (NEURONTIN) 800 MG tablet Take 1 tablet (800 mg total) by mouth every 6 (six) hours.   glipiZIDE  (GLUCOTROL) 10 MG tablet Take 1 tablet (10 mg total) by mouth 2 (two) times daily before a meal.   Insulin Lispro Prot & Lispro (HUMALOG MIX 75/25 KWIKPEN) (75-25) 100 UNIT/ML Kwikpen Inject 30 Units into the skin 2 (two) times daily.   Insulin Syringe-Needle U-100 (TRUEPLUS INSULIN SYRINGE) 31G X 5/16" 0.3 ML MISC USE 3 TIMES DAILY TO INJECT INSULIN   LANTUS 100 UNIT/ML injection INJECT 0.2 MLS (20 UNITS TOTAL) INTO THE SKIN DAILY   metFORMIN (GLUCOPHAGE) 500 MG tablet Take 1 tablet (500 mg total) by mouth 2 (two) times daily with a meal.   metoprolol succinate (TOPROL-XL) 50 MG 24 hr tablet Take 1 tablet (50 mg total) by mouth in the morning and at bedtime. Take with or immediately following a meal.   Multiple Vitamins-Minerals (CENTRUM SILVER 50+MEN) TABS Take 1 tablet by mouth daily.   mupirocin ointment (BACTROBAN) 2 % Apply 1 Application topically 2 (two) times daily.   Oxycodone HCl 10 MG TABS Take 10 mg by mouth 3 (three) times daily.   pantoprazole (PROTONIX) 40 MG tablet Take 1 tablet (40 mg total) by mouth daily.   potassium chloride SA (KLOR-CON M) 20 MEQ tablet Take 2 tablets (40 mEq total) by mouth daily.   QUEtiapine (SEROQUEL) 300 MG tablet Take 1.5 tablets (450 mg total) by mouth at bedtime.   sacubitril-valsartan (ENTRESTO) 24-26 MG Take 1 tablet by mouth 2 (two) times daily.   Semaglutide, 2 MG/DOSE, (OZEMPIC, 2 MG/DOSE,) 8 MG/3ML SOPN Inject 2 mg into the skin once a week. Monday   spironolactone (ALDACTONE) 25 MG tablet Take 0.5 tablets (12.5 mg total) by mouth at bedtime.   tadalafil (CIALIS) 20 MG tablet Take 20 mg by mouth daily as needed for erectile dysfunction.   TRUEPLUS PEN NEEDLES 31G X 6 MM MISC USE AS DIRECTED   zinc gluconate 50 MG tablet Take 50 mg by mouth daily.    Current Outpatient Medications on File Prior to Encounter  Medication Sig Dispense Refill   acetaminophen (TYLENOL) 500 MG tablet Take 2 tablets (1,000 mg total) by mouth 2 (two) times daily as  needed. (Patient taking differently: Take 1,000 mg by mouth 2 (two) times daily as needed for mild pain.) 180 tablet 3   albuterol (VENTOLIN HFA) 108 (90 Base) MCG/ACT inhaler Inhale 2 puffs into the lungs every 6 (six)  hours as needed for wheezing or shortness of breath. 18 g 1   aspirin EC 81 MG tablet Take 1 tablet (81 mg total) by mouth daily. Swallow whole. 90 tablet 3   atorvastatin (LIPITOR) 40 MG tablet TAKE 1 TABLET BY MOUTH ONCE DAILY NEEDS  FOLLOW  UP  APPOINTMENT  FOR  ANYMORE  REFILLS 30 tablet 0   blood glucose meter kit and supplies KIT Dispense based on patient and insurance preference. Use up to four times daily as directed. (FOR ICD-9 250.00, 250.01). 1 each 0   Continuous Blood Gluc Receiver (FREESTYLE LIBRE 14 DAY READER) DEVI 1 each by Does not apply route as needed. 1 each 11   Continuous Blood Gluc Sensor (FREESTYLE LIBRE 14 DAY SENSOR) MISC USE 1 APPLICATOR EVERY 14 DAYS 2 each 0   cyclobenzaprine (FLEXERIL) 10 MG tablet Take 10 mg by mouth 3 (three) times daily as needed for muscle spasms.     diclofenac Sodium (VOLTAREN) 1 % GEL Apply 2 g topically daily as needed for pain.     empagliflozin (JARDIANCE) 10 MG TABS tablet Take 1 tablet (10 mg total) by mouth daily. NEEDS FOLLOW UP APPOINTMENT FOR ANYMORE REFILLS 60 tablet 0   ferrous sulfate 325 (65 FE) MG tablet Take 1 tablet (325 mg total) by mouth every other day. (Patient taking differently: Take 325 mg by mouth daily with breakfast.) 30 tablet 1   furosemide (LASIX) 40 MG tablet Take 1.5 tablets (60 mg total) by mouth 2 (two) times daily. 90 tablet 3   gabapentin (NEURONTIN) 800 MG tablet Take 1 tablet (800 mg total) by mouth every 6 (six) hours. 120 tablet 5   glipiZIDE (GLUCOTROL) 10 MG tablet Take 1 tablet (10 mg total) by mouth 2 (two) times daily before a meal. 180 tablet 0   Insulin Lispro Prot & Lispro (HUMALOG MIX 75/25 KWIKPEN) (75-25) 100 UNIT/ML Kwikpen Inject 30 Units into the skin 2 (two) times daily. 54 mL 1    Insulin Syringe-Needle U-100 (TRUEPLUS INSULIN SYRINGE) 31G X 5/16" 0.3 ML MISC USE 3 TIMES DAILY TO INJECT INSULIN 300 each 3   LANTUS 100 UNIT/ML injection INJECT 0.2 MLS (20 UNITS TOTAL) INTO THE SKIN DAILY 30 mL 0   metFORMIN (GLUCOPHAGE) 500 MG tablet Take 1 tablet (500 mg total) by mouth 2 (two) times daily with a meal. 60 tablet 2   metoprolol succinate (TOPROL-XL) 50 MG 24 hr tablet Take 1 tablet (50 mg total) by mouth in the morning and at bedtime. Take with or immediately following a meal. 180 tablet 2   Multiple Vitamins-Minerals (CENTRUM SILVER 50+MEN) TABS Take 1 tablet by mouth daily.     mupirocin ointment (BACTROBAN) 2 % Apply 1 Application topically 2 (two) times daily. 22 g 0   Oxycodone HCl 10 MG TABS Take 10 mg by mouth 3 (three) times daily.     pantoprazole (PROTONIX) 40 MG tablet Take 1 tablet (40 mg total) by mouth daily. 90 tablet 1   potassium chloride SA (KLOR-CON M) 20 MEQ tablet Take 2 tablets (40 mEq total) by mouth daily. 60 tablet 6   QUEtiapine (SEROQUEL) 300 MG tablet Take 1.5 tablets (450 mg total) by mouth at bedtime. 135 tablet 1   sacubitril-valsartan (ENTRESTO) 24-26 MG Take 1 tablet by mouth 2 (two) times daily. 180 tablet 3   Semaglutide, 2 MG/DOSE, (OZEMPIC, 2 MG/DOSE,) 8 MG/3ML SOPN Inject 2 mg into the skin once a week. Monday     spironolactone (ALDACTONE)  25 MG tablet Take 0.5 tablets (12.5 mg total) by mouth at bedtime. 45 tablet 3   tadalafil (CIALIS) 20 MG tablet Take 20 mg by mouth daily as needed for erectile dysfunction.     TRUEPLUS PEN NEEDLES 31G X 6 MM MISC USE AS DIRECTED 100 each 0   zinc gluconate 50 MG tablet Take 50 mg by mouth daily.     colchicine 0.6 MG tablet Daily for 7 days then daily PRN for gout flare (Patient not taking: Reported on 10/10/2022) 30 tablet 0   metolazone (ZAROXOLYN) 2.5 MG tablet Take 1 tablet (2.5 mg total) by mouth as directed. (Patient not taking: Reported on 10/30/2022) 5 tablet 2   No current  facility-administered medications on file prior to encounter.   BP 110/80   Pulse 76   Wt (!) 140.6 kg (310 lb)   SpO2 98%   BMI 39.80 kg/m   Wt Readings from Last 3 Encounters:  10/30/22 (!) 140.6 kg (310 lb)  10/25/22 (!) 140.6 kg (310 lb)  10/10/22 (!) 136.9 kg (301 lb 12.8 oz)   Physical Exam: General:  NAD. No resp difficulty, walked into clinic HEENT: Normal Neck: Supple. No JVD, thick neck. Carotids 2+ bilat; no bruits. No lymphadenopathy or thryomegaly appreciated. Cor: PMI nondisplaced. Regular rate & rhythm. No rubs, gallops or murmurs. Lungs: Clear Abdomen: Obese, nontender, nondistended. No hepatosplenomegaly. No bruits or masses. Good bowel sounds. Extremities: No cyanosis, clubbing, rash, edema Neuro: Alert & oriented x 3, cranial nerves grossly intact. Moves all 4 extremities w/o difficulty. Affect pleasant.  Assessment/Plan: 1. Chronic systolic CHF with recovery in LV function: Echo in 11/20 with EF 25-30%, echo 5/21 with EF 30% with mildly decreased RV systolic function. Biotronik ICD.  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 Echo 8/22 with improvement in LVEF to 50-55%. No longer drinking ETOH. TEE (1/23) EF 50-55%. NYHA class II. He is not volume overloaded today. GDMT limited by CKD and low BP. - Continue Lasix 60 mg bid. BMET/BNP today. - Continue Toprol XL 50 mg bid.  - Continue Entresto 24/26 mg bid.  - Continue spiro 12.5 mg daily.  - Continue Jardiance 10 mg daily. No GU symptoms. 2. 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 Enteroscopy 8/22 with mild gastritis, three non bleeding angioectasias in duodenum treated with APC and 1 clip. No further bleeding issues. - Iron deficient, continue iron suppl. Will re-check iron studies. - Continue PPI.  - He has not used ETOH. 3. Paroxysmal atrial fibrillation: Regular on exam today. CHADS2-VASc score = 4 (CAD, CHF, HTN, DM).  Off  Eliquis with frequent GIB. - Now s/p Watchman 12/22. - Continue ASA. 4. CAD: Last cath 09/2021 with occluded OM2 stent, 90% small OM1, diffuse disease RCA treated medically.   - Currently no s/s angina. - On statin + ASA.  5. HTN: Well controlled. 6. OSA: wearing CPAP. 7. CKD 3a: BMET today. 8. Obesity: Body mass index is 39.8 kg/m. - He is now on semaglutide.  Follow up in 3 months with Dr. Aundra Dubin.  Allena Katz, FNP-BC 10/30/22

## 2022-10-30 NOTE — Progress Notes (Signed)
Paramedicine Encounter    Patient ID: [name], male    DOB: [birth year], 61 y.o.   MRN: [id number]  Met with [name] today in clinic where he was seen by [facility]. [name] reports he is feeling better today than last week and states that he feels the one time dose of Metolazone helped.   Weight- 310lbs BP 110/80 HR- 76 O2- 98%  No med changes pending labs. I had already filled one week of meds. I will follow up pending his labs.   [name] agreed and was reminded of upcoming appointments. I will see [name] in one week. He agreed with plan.   PAPS NEEDED: (will work on same) [place] [facility]    ACTION: Home visit completed

## 2022-11-01 ENCOUNTER — Ambulatory Visit: Payer: Medicare Other | Admitting: Adult Health

## 2022-11-01 ENCOUNTER — Encounter: Payer: Self-pay | Admitting: Adult Health

## 2022-11-01 VITALS — BP 142/72 | HR 69 | Temp 98.2°F | Ht 74.0 in | Wt 315.8 lb

## 2022-11-01 DIAGNOSIS — Z23 Encounter for immunization: Secondary | ICD-10-CM

## 2022-11-01 DIAGNOSIS — G4733 Obstructive sleep apnea (adult) (pediatric): Secondary | ICD-10-CM | POA: Diagnosis not present

## 2022-11-01 NOTE — Assessment & Plan Note (Signed)
Very severe sleep apnea with poor compliance.  Patient has been encouraged on BiPAP compliance.  Patient education given on potential complications of untreated sleep apnea.  Patient has significant underlying cardiovascular comorbidities. Patient seen to wear his BIPAP every single night for at least 6 or more hours each night. - discussed how weight can impact sleep and risk for sleep disordered breathing - discussed options to assist with weight loss: combination of diet modification, cardiovascular and strength training exercises   - had an extensive discussion regarding the adverse health consequences related to untreated sleep disordered breathing - specifically discussed the risks for hypertension, coronary artery disease, cardiac dysrhythmias, cerebrovascular disease, and diabetes - lifestyle modification discussed   - discussed how sleep disruption can increase risk of accidents, particularly when driving - safe driving practices were discussed    Plan   Patient Instructions  Wear BIPAP At bedtime and with naps, goal is wear to >6hrs.  Work on healthy weight loss Do not drive if sleepy  Healthy sleep regimen  Use caution with sedating medications  Flu shot today  Follow up in 4-6 months and As needed

## 2022-11-01 NOTE — Progress Notes (Signed)
_0  ID: [name], male    DOB: 09-10-1961, 61 y.o.   MRN: [id number]  Chief Complaint  Patient presents with   Follow-up    Referring provider: Bo Merino I, NP  HPI: 61 year old male seen for sleep consult July 18, 2022 to establish for sleep apnea Medical history significant for A-fib, congestive heart failure status post AICD placement, schizophrenia, chronic pain and chronic kidney disease  TEST/EVENTS :  NPSG by PCP , done on 05/11/22 showed severe OSA with AHI at 70/hr, SpO2 low at 51%. Titration showed inadequate control on CPAP . Required BIPAP19/15 cm . Large size [employer] Full Face Ev[place] Full mask     11/01/2022 Follow up : OSA  Patient presents for a 55-monthfollow-up.  Patient was seen last visit for sleep consult to establish for sleep apnea.  Patient was set up for an in lab sleep study that was done on May 11, 2022 that showed severe sleep apnea with AHI at 70/hour and SPO2 low at 51%.  Titration portion of the study showed an adequate control on CPAP and was transitioned to BiPAP 19/15 cm H2O.  Patient has recently started BiPAP.  Says it is hard for him to wear.  Has been trying to get used to it but does not wear it every single night.  BiPAP download shows very poor usage was 37% usage.  Daily average usage at 3 hours.  Patient is on auto BiPAP IPAP max 19 and EPAP minimum 10 and pressure reported 4 cm H2O.  AHI 4.4/hour.  Positive mask leaks.  We had a long discussion regarding importance of using his BiPAP.  Went over again the potential complications of untreated sleep apnea.  No Known Allergies  Immunization History  Administered Date(s) Administered   Influenza,inj,Quad PF,6+ Mos 10/13/2019, 09/13/2020, 08/03/2021   PFIZER(Purple Top)SARS-COV-2 Vaccination 03/13/2020   Pneumococcal Polysaccharide-23 10/16/2019   Tdap 09/13/2020   Zoster Recombinat (Shingrix) 08/03/2021    Past Medical History:  Diagnosis Date   AICD (automatic  cardioverter/defibrillator) present    Anxiety    Atrial fibrillation (HCC)    CAD (coronary artery disease)    Cardiomyopathy [place]    CHF [place]estive heart [place]) [place] 09/2019   Depression    Diabetes mellitus    Erectile dysfunction 11/2019   GI bleeding    H/O right heart catheterization 09/2019   Hypertension    Presence of permanent cardiac pacemaker    Presence of Watchman left atrial appendage closure device 10/27/2021   27 mm Watchman Flex Device per Dr. LQuentin Ore  Schizophrenia ([facility]    Sleep apnea    uses cpap    Tobacco History: Social History   Tobacco Use  Smoking Status Never   Passive exposure: Never  Smokeless Tobacco Never   Counseling given: Not Answered   Outpatient Medications Prior to Visit  Medication Sig Dispense Refill   albuterol (VENTOLIN HFA) 108 (90 Base) MCG/ACT inhaler Inhale 2 puffs into the lungs every 6 (six) hours as needed for wheezing or shortness of breath. 18 g 1   aspirin EC 81 MG tablet Take 1 tablet (81 mg total) by mouth daily. Swallow whole. 90 tablet 3   atorvastatin (LIPITOR) 40 MG tablet TAKE 1 TABLET BY MOUTH ONCE DAILY NEEDS  FOLLOW  UP  APPOINTMENT  FOR  ANYMORE  REFILLS 30 tablet 0   blood glucose meter kit and supplies KIT Dispense based on patient and insurance preference. Use up to four times daily as  directed. (FOR ICD-9 250.00, 250.01). 1 each 0   colchicine 0.6 MG tablet Daily for 7 days then daily PRN for gout flare 30 tablet 0   Continuous Blood Gluc Receiver (FREESTYLE LIBRE 14 DAY READER) DEVI 1 each by Does not apply route as needed. 1 each 11   Continuous Blood Gluc Sensor (FREESTYLE LIBRE 14 DAY SENSOR) MISC USE 1 APPLICATOR EVERY 14 DAYS 2 each 0   cyclobenzaprine (FLEXERIL) 10 MG tablet Take 10 mg by mouth 3 (three) times daily as needed for muscle spasms.     diclofenac Sodium (VOLTAREN) 1 % GEL Apply 2 g topically daily as needed for pain.     empagliflozin (JARDIANCE) 10 MG TABS tablet Take 1 tablet (10  mg total) by mouth daily. NEEDS FOLLOW UP APPOINTMENT FOR ANYMORE REFILLS 60 tablet 0   ferrous sulfate 325 (65 FE) MG tablet Take 1 tablet (325 mg total) by mouth every other day. (Patient taking differently: Take 325 mg by mouth daily with breakfast.) 30 tablet 1   furosemide (LASIX) 40 MG tablet Take 1.5 tablets (60 mg total) by mouth 2 (two) times daily. 90 tablet 3   gabapentin (NEURONTIN) 800 MG tablet Take 1 tablet (800 mg total) by mouth every 6 (six) hours. 120 tablet 5   glipiZIDE (GLUCOTROL) 10 MG tablet Take 1 tablet (10 mg total) by mouth 2 (two) times daily before a meal. 180 tablet 0   Insulin Lispro Prot & Lispro (HUMALOG MIX 75/25 KWIKPEN) (75-25) 100 UNIT/ML Kwikpen Inject 30 Units into the skin 2 (two) times daily. 54 mL 1   Insulin Syringe-Needle U-100 (TRUEPLUS INSULIN SYRINGE) 31G X 5/16" 0.3 ML MISC USE 3 TIMES DAILY TO INJECT INSULIN 300 each 3   LANTUS 100 UNIT/ML injection INJECT 0.2 MLS (20 UNITS TOTAL) INTO THE SKIN DAILY 30 mL 0   metolazone (ZAROXOLYN) 2.5 MG tablet Take 1 tablet (2.5 mg total) by mouth as directed. 5 tablet 2   Multiple Vitamins-Minerals (CENTRUM SILVER 50+MEN) TABS Take 1 tablet by mouth daily.     mupirocin ointment (BACTROBAN) 2 % Apply 1 Application topically 2 (two) times daily. 22 g 0   Oxycodone HCl 10 MG TABS Take 10 mg by mouth 3 (three) times daily.     pantoprazole (PROTONIX) 40 MG tablet Take 1 tablet (40 mg total) by mouth daily. 90 tablet 1   potassium chloride SA (KLOR-CON M) 20 MEQ tablet Take 2 tablets (40 mEq total) by mouth daily. 60 tablet 6   QUEtiapine (SEROQUEL) 300 MG tablet Take 1.5 tablets (450 mg total) by mouth at bedtime. 135 tablet 1   sacubitril-valsartan (ENTRESTO) 24-26 MG Take 1 tablet by mouth 2 (two) times daily. 180 tablet 3   Semaglutide, 2 MG/DOSE, (OZEMPIC, 2 MG/DOSE,) 8 MG/3ML SOPN Inject 2 mg into the skin once a week. Monday     spironolactone (ALDACTONE) 25 MG tablet Take 0.5 tablets (12.5 mg total) by mouth  at bedtime. 45 tablet 3   tadalafil (CIALIS) 20 MG tablet Take 20 mg by mouth daily as needed for erectile dysfunction.     TRUEPLUS PEN NEEDLES 31G X 6 MM MISC USE AS DIRECTED 100 each 0   zinc gluconate 50 MG tablet Take 50 mg by mouth daily.     acetaminophen (TYLENOL) 500 MG tablet Take 2 tablets (1,000 mg total) by mouth 2 (two) times daily as needed. (Patient not taking: Reported on 11/01/2022) 180 tablet 3   metFORMIN (GLUCOPHAGE) 500 MG tablet Take  1 tablet (500 mg total) by mouth 2 (two) times daily with a meal. 60 tablet 2   metoprolol succinate (TOPROL-XL) 50 MG 24 hr tablet Take 1 tablet (50 mg total) by mouth in the morning and at bedtime. Take with or immediately following a meal. 180 tablet 2   No facility-administered medications prior to visit.     Review of Systems:   Constitutional:   No  weight loss, night sweats,  Fevers, chills, fatigue, or  lassitude.  HEENT:   No headaches,  Difficulty swallowing,  Tooth/dental problems, or  Sore throat,                No sneezing, itching, ear ache, nasal congestion, post nasal drip,   CV:  No chest pain,  Orthopnea, PND, swelling in lower extremities, anasarca, dizziness, palpitations, syncope.   GI  No heartburn, indigestion, abdominal pain, nausea, vomiting, diarrhea, change in bowel habits, loss of appetite, bloody stools.   Resp: No shortness of breath with exertion or at rest.  No excess mucus, no productive cough,  No non-productive cough,  No coughing up of blood.  No change in color of mucus.  No wheezing.  No chest wall deformity  Skin: no rash or lesions.  GU: no dysuria, change in color of urine, no urgency or frequency.  No flank pain, no hematuria   MS:  No joint pain or swelling.  No decreased range of motion.  No back pain.    Physical Exam  BP (!) 142/72 (BP Location: Left Arm, Cuff Size: Large)   Pulse 69   Temp 98.2 F (36.8 C) (Temporal)   Ht _0  (1.88 m)   Wt (!) 315 lb 12.8 oz (143.2 kg)   SpO2  95%   BMI 40.55 kg/m   GEN: A/Ox3; pleasant , NAD, well nourished    HEENT:  [place]/AT,   NOSE-clear, THROAT-clear, no lesions, no postnasal drip or exudate noted. Class 4 MP airway   NECK:  Supple w/ fair ROM; no JVD; normal carotid impulses w/o bruits; no thyromegaly or nodules palpated; no lymphadenopathy.    RESP  Clear  P & A; w/o, wheezes/ rales/ or rhonchi. no accessory muscle use, no dullness to percussion  CARD:  RRR, no m/r/g, no peripheral edema, pulses intact, no cyanosis or clubbing.  GI:   Soft & nt; nml bowel sounds; no organomegaly or masses detected.   Musco: Warm bil, no deformities or joint swelling noted.   Neuro: alert, no focal deficits noted.    Skin: Warm, no lesions or rashes    Lab Results:  CBC       Imaging: No results found.        No data to display          No results found for: "NITRICOXIDE"      Assessment & Plan:   OSA (obstructive sleep apnea) Very severe sleep apnea with poor compliance.  Patient has been encouraged on BiPAP compliance.  Patient education given on potential complications of untreated sleep apnea.  Patient has significant underlying cardiovascular comorbidities. Patient seen to wear his BIPAP every single night for at least 6 or more hours each night. - discussed how weight can impact sleep and risk for sleep disordered breathing - discussed options to assist with weight loss: combination of diet modification, cardiovascular and strength training exercises   - had an extensive discussion regarding the adverse health consequences related to untreated sleep disordered breathing - specifically discussed the  risks for hypertension, coronary artery disease, cardiac dysrhythmias, cerebrovascular disease, and diabetes - lifestyle modification discussed   - discussed how sleep disruption can increase risk of accidents, particularly when driving - safe driving practices were discussed    Plan   Patient  Instructions  Wear BIPAP At bedtime and with naps, goal is wear to >6hrs.  Work on healthy weight loss Do not drive if sleepy  Healthy sleep regimen  Use caution with sedating medications  Flu shot today  Follow up in 4-6 months and As needed         Rexene Edison, NP 11/01/2022

## 2022-11-01 NOTE — Patient Instructions (Addendum)
Wear BIPAP At bedtime and with naps, goal is wear to >6hrs.  Work on healthy weight loss Do not drive if sleepy  Healthy sleep regimen  Use caution with sedating medications  Flu shot today  Follow up in 4-6 months with Dr. Ander Slade or Adrien Shankar NP  and As needed

## 2022-11-06 ENCOUNTER — Ambulatory Visit (INDEPENDENT_AMBULATORY_CARE_PROVIDER_SITE_OTHER): Payer: Medicare Other | Admitting: Nurse Practitioner

## 2022-11-06 ENCOUNTER — Other Ambulatory Visit (HOSPITAL_COMMUNITY): Payer: Self-pay

## 2022-11-06 ENCOUNTER — Encounter: Payer: Self-pay | Admitting: Nurse Practitioner

## 2022-11-06 VITALS — BP 125/67 | HR 73 | Ht 74.0 in | Wt 311.0 lb

## 2022-11-06 DIAGNOSIS — E1165 Type 2 diabetes mellitus with hyperglycemia: Secondary | ICD-10-CM

## 2022-11-06 DIAGNOSIS — N529 Male erectile dysfunction, unspecified: Secondary | ICD-10-CM

## 2022-11-06 DIAGNOSIS — Z794 Long term (current) use of insulin: Secondary | ICD-10-CM | POA: Diagnosis not present

## 2022-11-06 LAB — POCT GLYCOSYLATED HEMOGLOBIN (HGB A1C): Hemoglobin A1C: 6.9 % — AB (ref 4.0–5.6)

## 2022-11-06 IMAGING — CT CT ANGIO CHEST
2 of 6 series · 18 of 36 positions shown · IV contrast (omnipaque)
Comparison: 10/11/2019

CLINICAL DATA: 59-year-old male with a history of chest pain and
possible pulmonary embolism

EXAM:
CT ANGIOGRAPHY CHEST WITH CONTRAST
TECHNIQUE: Multidetector CT imaging of the chest was performed using the
standard protocol during bolus administration of intravenous
contrast. Multiplanar CT image reconstructions and MIPs were
obtained to evaluate the vascular anatomy.
CONTRAST:  80mL OMNIPAQUE IOHEXOL 350 MG/ML SOLN

[Series 7: pe thins · axial · 0.98mm/px · z∈[-154,+110]mm · 17 of 199 slices shown]
[im 12/199  lung]
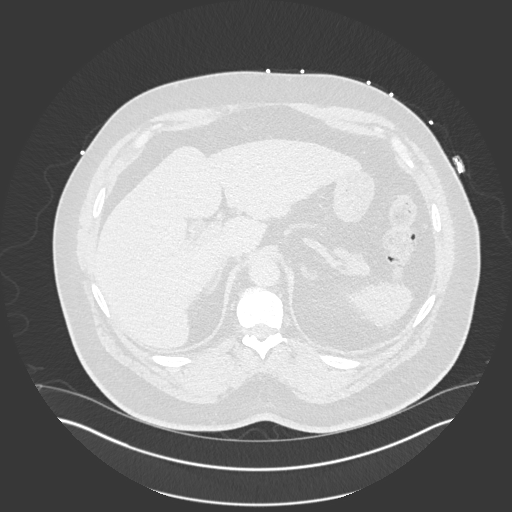
[im 23/199  mediastinal]
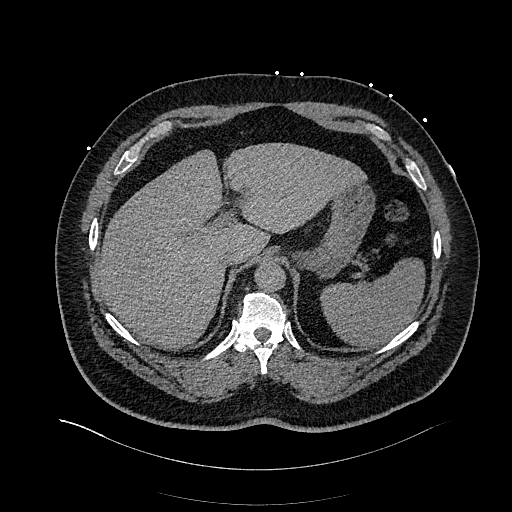
[im 34/199  lung]
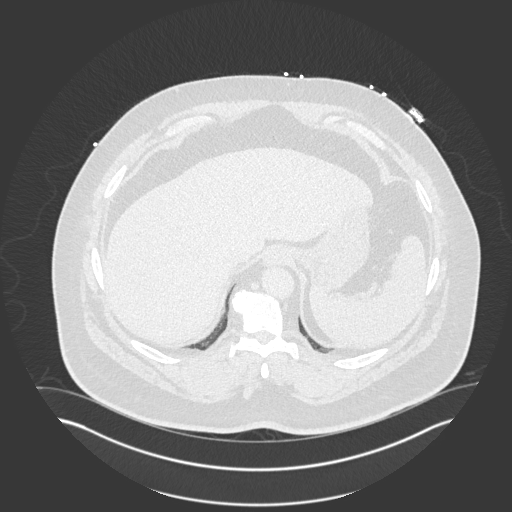
[im 45/199  mediastinal]
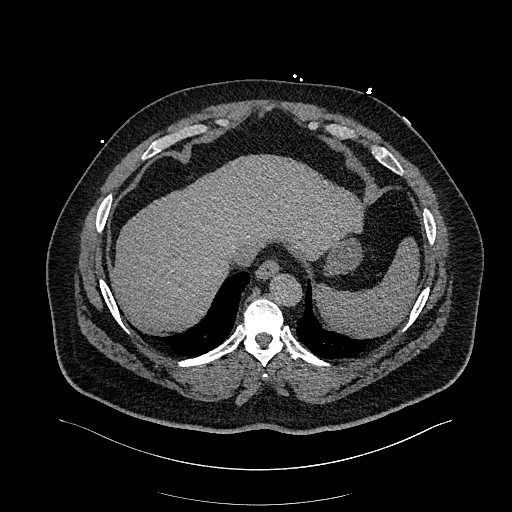
[im 56/199  lung]
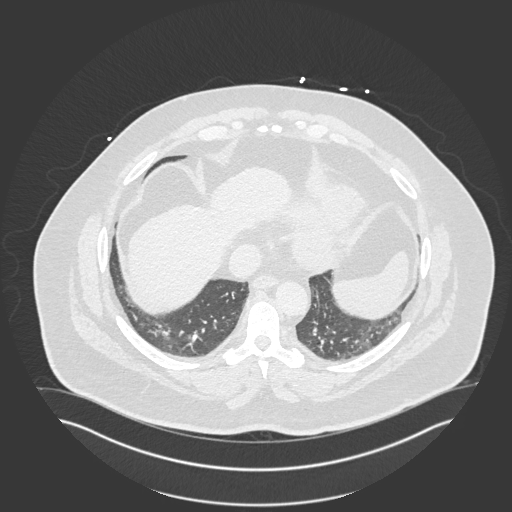
[im 67/199  mediastinal]
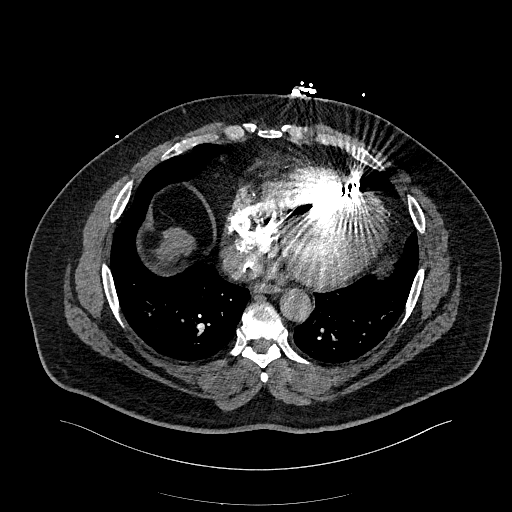
[im 78/199  lung]
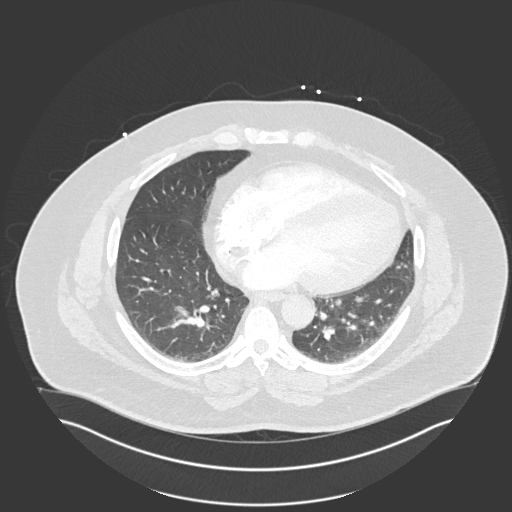
[im 89/199  mediastinal]
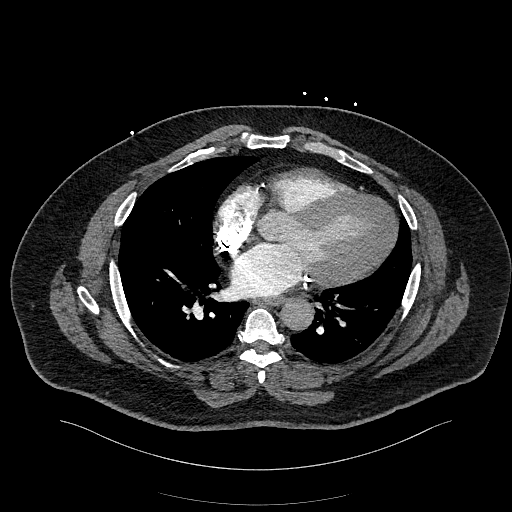
[im 100/199  lung]
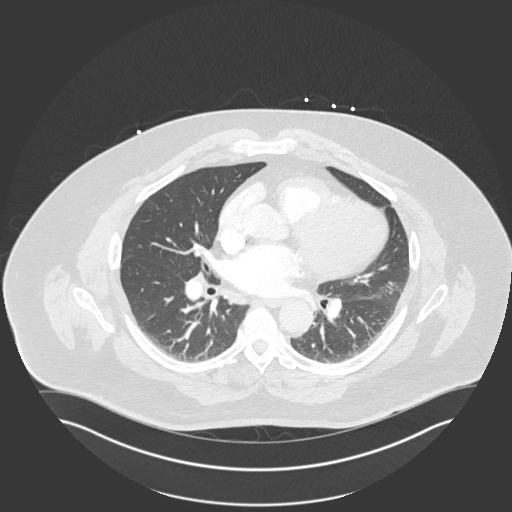
[im 111/199  mediastinal]
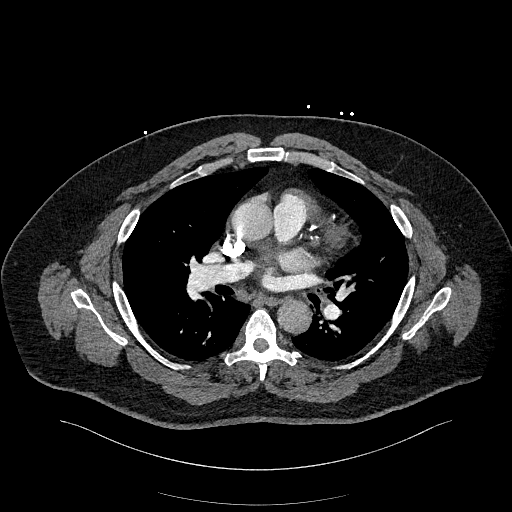
[im 122/199  lung]
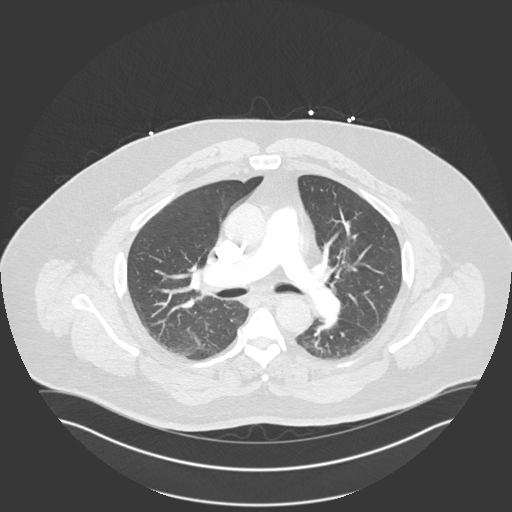
[im 133/199  mediastinal]
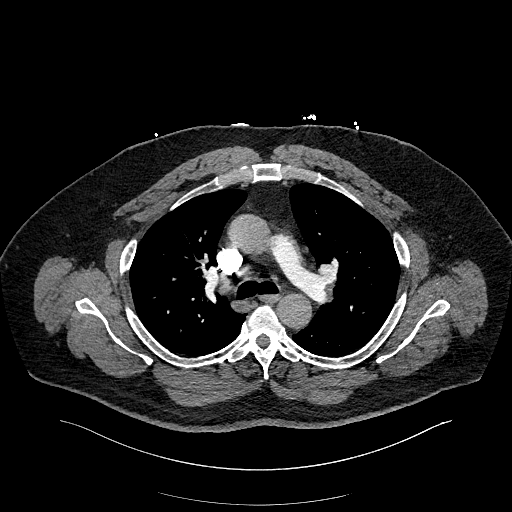
[im 144/199  lung]
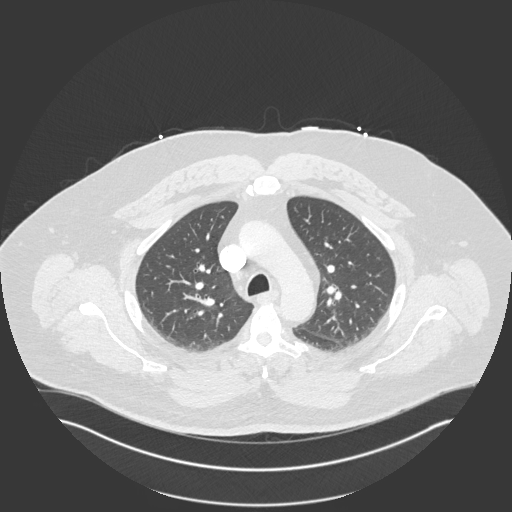
[im 155/199  mediastinal]
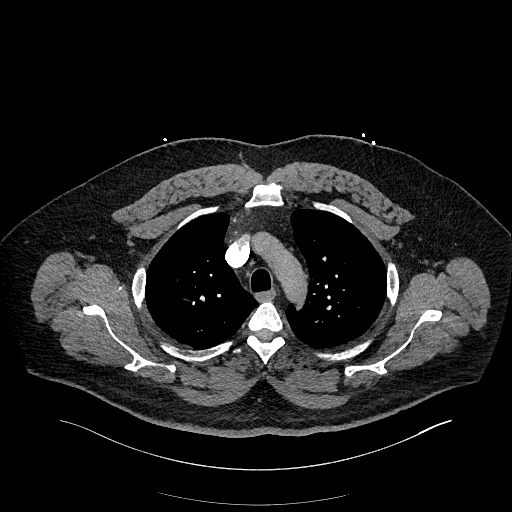
[im 166/199  lung]
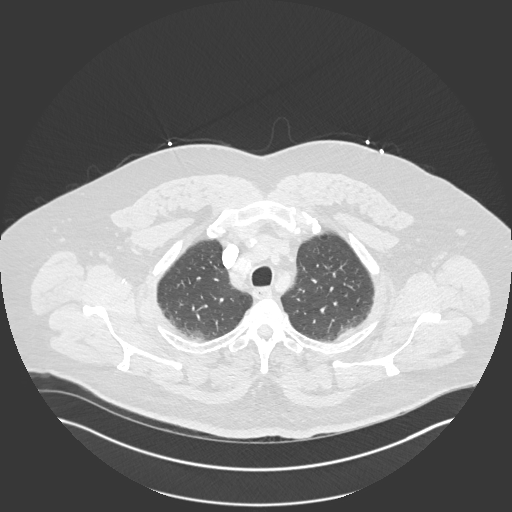
[im 177/199  mediastinal]
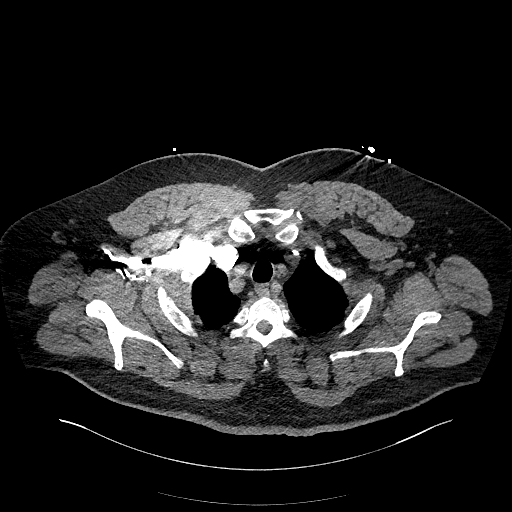
[im 188/199  lung]
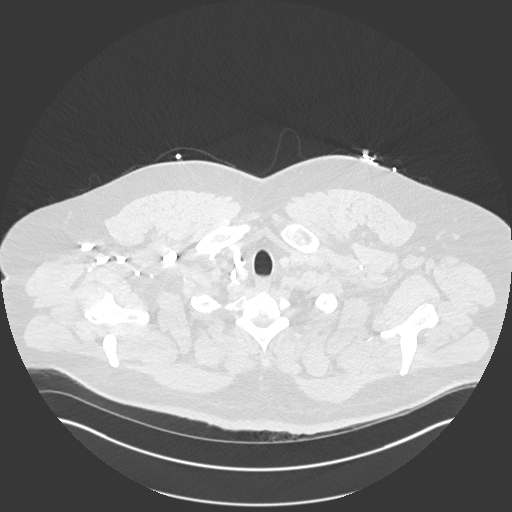

[Series 8: pe 2mm cor · coronal · 0.59mm/px · 1 of 92 slices shown]
[im 46/92  mediastinal]
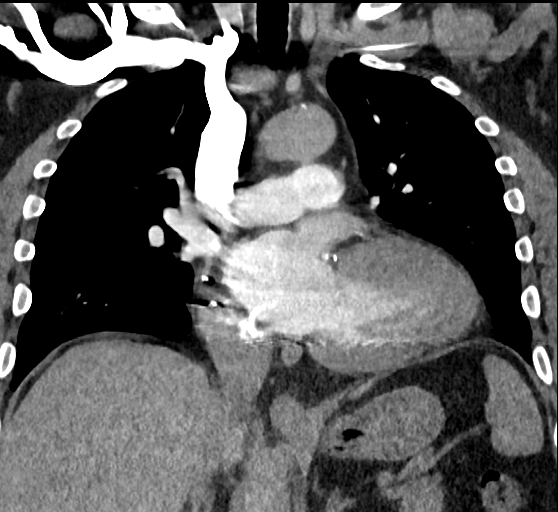

[18 of 36 positions shown; findings below may reference images not displayed]

FINDINGS: Cardiovascular:

Heart:

Similar cardiomegaly. No pericardial fluid/thickening.
Calcifications of the left anterior descending, circumflex, right
coronary arteries.

Pacing lead from left subclavian approach.

Aorta:

Calcifications of the aortic arch. No aneurysm. No periaortic fluid.

Pulmonary arteries:

No central, lobar, segmental, or proximal subsegmental filling
defects.

Mediastinum/Nodes: No mediastinal adenopathy. Unremarkable
appearance of the thoracic esophagus.

Unremarkable thoracic inlet

Lungs/Pleura: Central airways are clear. No pleural effusion. No
confluent airspace disease.

No pneumothorax.

Upper Abdomen: No acute finding of the upper abdomen. Nodular
changes of the liver surface are suggested.

Musculoskeletal: No acute displaced fracture. Degenerative changes
of the spine.

Review of the MIP images confirms the above findings.
IMPRESSION: CT negative for pulmonary emboli. No acute CT finding to account for
chest pain.

Aortic atherosclerosis and coronary artery disease. Aortic
Atherosclerosis (TCIVP-VAP.P).

Incidental note made of early cirrhotic changes of the liver.

## 2022-11-06 NOTE — Assessment & Plan Note (Signed)
  Lab Results  Component Value Date   HGBA1C 6.9 (A) 11/06/2022    - POCT glycosylated hemoglobin (Hb A1C)  2. Erectile dysfunction, unspecified erectile dysfunction type  - Ambulatory referral to Urology    Follow up:  Follow up in 3 months or sooner if needed

## 2022-11-06 NOTE — Progress Notes (Signed)
Paramedicine Encounter    Patient ID: [name], male    DOB: 02-May-1961, 61 y.o.   MRN: [id number]   Arrived for home visit for [name]. He was seen by PCP today. A1C down to 6.9. He is pleased with this news. He missed a few doses of his medications over the last week and reports his weight is down 4lbs and does not feel short of breath. He appears to be feeling better this week compared to the last two weeks. No assessment for vitals taken due to him being seen by PCP earlier today.   I reviewed meds and confirmed same filling pill box for one week. Refills as noted: -Seroquel -Entresto -Metoprolol -Glipizide   I reviewed upcoming appointments and wrote down same. I plan to come out in one week. He agreed.   LIEAP application started- pending his bank statements.   Home visit complete.   [name], [place] 11/06/2022     Patient Care Team: Fenton Foy, NP as PCP - General (Pulmonary Disease) [name], MD as PCP - Cardiology (Cardiology) [name], MD as PCP - Advanced Heart Failure (Cardiology) [name], MD as PCP - Electrophysiology (Cardiology) [name], LCSW as Social Worker (Licensed Clinical Social Worker) [name], MD as Consulting Physician (Gastroenterology)  Patient Active Problem List   Diagnosis Date Noted   Obesity (BMI 35.0-39.9 without comorbidity) 07/18/2022   Cellulitis 06/22/2022   Cellulitis, leg 06/20/2022   Presence of Watchman left atrial appendage closure device 10/28/2021   Atrial fibrillation ([place]) 10/27/2021   Melena    Chronic diastolic CHF (congestive heart failure) ([place])    GI bleed 06/29/2021   Severe anemia 06/29/2021   Adenomatous polyp of ascending colon    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   Acute blood loss anemia    Angiodysplasia of stomach    Chronic anticoagulation    CHF exacerbation ([place]) 06/15/2020   AKI (acute  kidney injury) ([place]) 06/15/2020   CKD (chronic kidney disease), stage III ([place]) 06/15/2020   Anemia due to chronic blood loss    Gastric AVM    Angiodysplasia of duodenum with hemorrhage    Chronic systolic CHF (congestive heart failure) ([place]) 04/05/2020   Anemia 04/05/2020   PAF (paroxysmal atrial fibrillation) ([place]) 04/05/2020   OSA (obstructive sleep apnea) 04/05/2020   Syncope and collapse 04/05/2020   Type 2 diabetes mellitus with hyperglycemia, with long-term current use of insulin ([place]) 12/03/2019   Diabetic polyneuropathy associated with type 2 diabetes mellitus ([place]) 12/03/2019   Erectile dysfunction 12/03/2019   Paranoid schizophrenia, chronic condition ([place]) 01/26/2015   Severe recurrent major depressive disorder with psychotic features ([place]) 01/26/2015   GAD (generalized anxiety disorder) 01/26/2015   OCD (obsessive compulsive disorder) 01/26/2015   Panic disorder without agoraphobia 01/26/2015   Insomnia 01/26/2015    Current Outpatient Medications:    acetaminophen (TYLENOL) 500 MG tablet, Take 2 tablets (1,000 mg total) by mouth 2 (two) times daily as needed., Disp: 180 tablet, Rfl: 3   albuterol (VENTOLIN HFA) 108 (90 Base) MCG/ACT inhaler, Inhale 2 puffs into the lungs every 6 (six) hours as needed for wheezing or shortness of breath., Disp: 18 g, Rfl: 1   aspirin EC 81 MG tablet, Take 1 tablet (81 mg total) by mouth daily. Swallow whole., Disp: 90 tablet, Rfl: 3   atorvastatin (LIPITOR) 40 MG tablet, TAKE 1 TABLET BY MOUTH ONCE DAILY NEEDS  FOLLOW  UP  APPOINTMENT  FOR  ANYMORE  REFILLS, Disp: 30 tablet, Rfl: 0   blood glucose meter kit and supplies KIT, Dispense based on patient and insurance preference. Use up to four times daily as directed. (FOR ICD-9 250.00, 250.01)., Disp: 1 each, Rfl: 0   colchicine 0.6 MG tablet, Daily for 7 days then daily PRN for gout flare, Disp: 30 tablet, Rfl: 0   Continuous Blood Gluc Receiver (FREESTYLE LIBRE 14 DAY READER) DEVI, 1 each by  Does not apply route as needed., Disp: 1 each, Rfl: 11   Continuous Blood Gluc Sensor (FREESTYLE LIBRE 14 DAY SENSOR) MISC, USE 1 APPLICATOR EVERY 14 DAYS, Disp: 2 each, Rfl: 0   cyclobenzaprine (FLEXERIL) 10 MG tablet, Take 10 mg by mouth 3 (three) times daily as needed for muscle spasms., Disp: , Rfl:    diclofenac Sodium (VOLTAREN) 1 % GEL, Apply 2 g topically daily as needed for pain., Disp: , Rfl:    empagliflozin (JARDIANCE) 10 MG TABS tablet, Take 1 tablet (10 mg total) by mouth daily. NEEDS FOLLOW UP APPOINTMENT FOR ANYMORE REFILLS, Disp: 60 tablet, Rfl: 0   ferrous sulfate 325 (65 FE) MG tablet, Take 1 tablet (325 mg total) by mouth every other day. (Patient taking differently: Take 325 mg by mouth daily with breakfast.), Disp: 30 tablet, Rfl: 1   furosemide (LASIX) 40 MG tablet, Take 1.5 tablets (60 mg total) by mouth 2 (two) times daily., Disp: 90 tablet, Rfl: 3   gabapentin (NEURONTIN) 800 MG tablet, Take 1 tablet (800 mg total) by mouth every 6 (six) hours., Disp: 120 tablet, Rfl: 5   glipiZIDE (GLUCOTROL) 10 MG tablet, Take 1 tablet (10 mg total) by mouth 2 (two) times daily before a meal., Disp: 180 tablet, Rfl: 0   Insulin Lispro Prot & Lispro (HUMALOG MIX 75/25 KWIKPEN) (75-25) 100 UNIT/ML Kwikpen, Inject 30 Units into the skin 2 (two) times daily., Disp: 54 mL, Rfl: 1   Insulin Syringe-Needle U-100 (TRUEPLUS INSULIN SYRINGE) 31G X 5/16" 0.3 ML MISC, USE 3 TIMES DAILY TO INJECT INSULIN, Disp: 300 each, Rfl: 3   LANTUS 100 UNIT/ML injection, INJECT 0.2 MLS (20 UNITS TOTAL) INTO THE SKIN DAILY, Disp: 30 mL, Rfl: 0   metFORMIN (GLUCOPHAGE) 500 MG tablet, Take 1 tablet (500 mg total) by mouth 2 (two) times daily with a meal., Disp: 60 tablet, Rfl: 2   metolazone (ZAROXOLYN) 2.5 MG tablet, Take 1 tablet (2.5 mg total) by mouth as directed., Disp: 5 tablet, Rfl: 2   metoprolol succinate (TOPROL-XL) 50 MG 24 hr tablet, Take 1 tablet (50 mg total) by mouth in the morning and at bedtime. Take  with or immediately following a meal., Disp: 180 tablet, Rfl: 2   Multiple Vitamins-Minerals (CENTRUM SILVER 50+MEN) TABS, Take 1 tablet by mouth daily., Disp: , Rfl:    mupirocin ointment (BACTROBAN) 2 %, Apply 1 Application topically 2 (two) times daily., Disp: 22 g, Rfl: 0   Oxycodone HCl 10 MG TABS, Take 10 mg by mouth 3 (three) times daily., Disp: , Rfl:    pantoprazole (PROTONIX) 40 MG tablet, Take 1 tablet (40 mg total) by mouth daily., Disp: 90 tablet, Rfl: 1   potassium chloride SA (KLOR-CON M) 20 MEQ tablet, Take 2 tablets (40 mEq total) by mouth daily., Disp: 60 tablet, Rfl: 6   QUEtiapine (SEROQUEL) 300 MG tablet, Take 1.5 tablets (450 mg total) by mouth at bedtime., Disp: 135 tablet, Rfl: 1   sacubitril-valsartan (ENTRESTO) 24-26 MG, Take 1 tablet by mouth 2 (  two) times daily., Disp: 180 tablet, Rfl: 3   Semaglutide, 2 MG/DOSE, (OZEMPIC, 2 MG/DOSE,) 8 MG/3ML SOPN, Inject 2 mg into the skin once a week. Monday, Disp: , Rfl:    spironolactone (ALDACTONE) 25 MG tablet, Take 0.5 tablets (12.5 mg total) by mouth at bedtime., Disp: 45 tablet, Rfl: 3   tadalafil (CIALIS) 20 MG tablet, Take 20 mg by mouth daily as needed for erectile dysfunction., Disp: , Rfl:    TRUEPLUS PEN NEEDLES 31G X 6 MM MISC, USE AS DIRECTED, Disp: 100 each, Rfl: 0   zinc gluconate 50 MG tablet, Take 50 mg by mouth daily., Disp: , Rfl:  No Known Allergies   Social History   Socioeconomic History   Marital status: Legally Separated    Spouse name: Not on file   Number of children: Not on file   Years of education: Not on file   Highest education level: Not on file  Occupational History   Not on file  Tobacco Use   Smoking status: Never    Passive exposure: Never   Smokeless tobacco: Never  Vaping Use   Vaping Use: Never used  Substance and Sexual Activity   Alcohol use: Not Currently    Alcohol/week: 2.0 standard drinks of alcohol    Types: 2 Cans of beer per week   Drug use: No   Sexual activity: Yes     Birth control/protection: None  Other Topics Concern   Not on file  Social History Narrative   Not on file   Social Determinants of Health   Financial Resource Strain: Low Risk  (12/23/2019)   Overall Financial Resource Strain (CARDIA)    Difficulty of Paying Living Expenses: Not very hard  Food Insecurity: No Food Insecurity (07/01/2021)   Hunger Vital Sign    Worried About Running Out of Food in the Last Year: Never true    Ran Out of Food in the Last Year: Never true  Transportation Needs: Unmet Transportation Needs (07/01/2021)   PRAPARE - Hydrologist (Medical): Yes    Lack of Transportation (Non-Medical): Yes  Physical Activity: Not on file  Stress: Not on file  Social Connections: Not on file  Intimate Partner Violence: Not on file    Physical Exam      Future Appointments  Date Time Provider [place]  12/01/2022  7:40 AM [facility] DEVICE REMOTES [facility]  01/29/2023  2:40 PM [name], MD [facility] None  02/05/2023 10:40 AM Fenton Foy, NP [place] None  03/02/2023  7:40 AM [facility] DEVICE REMOTES [facility]  06/01/2023  7:40 AM [facility] DEVICE REMOTES [facility]     ACTION: Home visit completed

## 2022-11-06 NOTE — Progress Notes (Signed)
_0  ID: [name], male    DOB: 10-29-1961, 61 y.o.   MRN: [id number]  Chief Complaint  Patient presents with   Follow-up    diabetes    Referring provider: Teena Dunk, NP   HPI  [name] 61 y.o. male  has a past medical history of AICD (automatic cardioverter/defibrillator) present, Anxiety, Atrial fibrillation ([place]), CAD (coronary artery disease), Cardiomyopathy ([place]), CHF (congestive heart failure) ([place]) (09/2019), Depression, Diabetes mellitus, Erectile dysfunction (11/2019), GI bleeding, H/O right heart catheterization (09/2019), Hypertension, Presence of permanent cardiac pacemaker, Presence of Watchman left atrial appendage closure device (10/27/2021), Schizophrenia ([place]), and Sleep apnea.   Patient presents today for diabetes follow-up.  Overall patient has been doing well since last visit.  His hemoglobin A1c has came down to 6.9.  Patient states he is trying to exercise more and watch his diet better.  He did have recent labs including iron studies through cardiology.  Patient is currently taking his iron supplement Mondays Wednesdays and Fridays.  We discussed that he may take this daily. Denies f/c/s, n/v/d, hemoptysis, PND, leg swelling Denies chest pain or edema     No Known Allergies  Immunization History  Administered Date(s) Administered   Influenza,inj,Quad PF,6+ Mos 10/13/2019, 09/13/2020, 08/03/2021, 11/01/2022   PFIZER(Purple Top)SARS-COV-2 Vaccination 03/13/2020   Pneumococcal Polysaccharide-23 10/16/2019   Tdap 09/13/2020   Zoster Recombinat (Shingrix) 08/03/2021    Past Medical History:  Diagnosis Date   AICD (automatic cardioverter/defibrillator) present    Anxiety    Atrial fibrillation (HCC)    CAD (coronary artery disease)    Cardiomyopathy ([place])    CHF (congestive heart failure) ([place]) 09/2019   Depression    Diabetes mellitus    Erectile dysfunction 11/2019   GI bleeding    H/O right heart catheterization  09/2019   Hypertension    Presence of permanent cardiac pacemaker    Presence of Watchman left atrial appendage closure device 10/27/2021   27 mm Watchman Flex Device per Dr. Quentin Ore   Schizophrenia [facility])    Sleep apnea    uses cpap    Tobacco History: Social History   Tobacco Use  Smoking Status Never   Passive exposure: Never  Smokeless Tobacco Never   Counseling given: Not Answered   Outpatient Encounter Medications as of 11/06/2022  Medication Sig   acetaminophen (TYLENOL) 500 MG tablet Take 2 tablets (1,000 mg total) by mouth 2 (two) times daily as needed.   albuterol (VENTOLIN HFA) 108 (90 Base) MCG/ACT inhaler Inhale 2 puffs into the lungs every 6 (six) hours as needed for wheezing or shortness of breath.   aspirin EC 81 MG tablet Take 1 tablet (81 mg total) by mouth daily. Swallow whole.   atorvastatin (LIPITOR) 40 MG tablet TAKE 1 TABLET BY MOUTH ONCE DAILY NEEDS  FOLLOW  UP  APPOINTMENT  FOR  ANYMORE  REFILLS   blood glucose meter kit and supplies KIT Dispense based on patient and insurance preference. Use up to four times daily as directed. (FOR ICD-9 250.00, 250.01).   colchicine 0.6 MG tablet Daily for 7 days then daily PRN for gout flare   Continuous Blood Gluc Receiver (FREESTYLE LIBRE 14 DAY READER) DEVI 1 each by Does not apply route as needed.   Continuous Blood Gluc Sensor (FREESTYLE LIBRE 14 DAY SENSOR) MISC USE 1 APPLICATOR EVERY 14 DAYS   cyclobenzaprine (FLEXERIL) 10 MG tablet Take 10 mg by mouth 3 (three) times daily as needed for muscle spasms.  diclofenac Sodium (VOLTAREN) 1 % GEL Apply 2 g topically daily as needed for pain.   empagliflozin (JARDIANCE) 10 MG TABS tablet Take 1 tablet (10 mg total) by mouth daily. NEEDS FOLLOW UP APPOINTMENT FOR ANYMORE REFILLS   ferrous sulfate 325 (65 FE) MG tablet Take 1 tablet (325 mg total) by mouth every other day. (Patient taking differently: Take 325 mg by mouth daily with breakfast.)   furosemide (LASIX) 40 MG  tablet Take 1.5 tablets (60 mg total) by mouth 2 (two) times daily.   glipiZIDE (GLUCOTROL) 10 MG tablet Take 1 tablet (10 mg total) by mouth 2 (two) times daily before a meal.   Insulin Lispro Prot & Lispro (HUMALOG MIX 75/25 KWIKPEN) (75-25) 100 UNIT/ML Kwikpen Inject 30 Units into the skin 2 (two) times daily.   Insulin Syringe-Needle U-100 (TRUEPLUS INSULIN SYRINGE) 31G X 5/16" 0.3 ML MISC USE 3 TIMES DAILY TO INJECT INSULIN   LANTUS 100 UNIT/ML injection INJECT 0.2 MLS (20 UNITS TOTAL) INTO THE SKIN DAILY   metolazone (ZAROXOLYN) 2.5 MG tablet Take 1 tablet (2.5 mg total) by mouth as directed.   Multiple Vitamins-Minerals (CENTRUM SILVER 50+MEN) TABS Take 1 tablet by mouth daily.   mupirocin ointment (BACTROBAN) 2 % Apply 1 Application topically 2 (two) times daily.   Oxycodone HCl 10 MG TABS Take 10 mg by mouth 3 (three) times daily.   pantoprazole (PROTONIX) 40 MG tablet Take 1 tablet (40 mg total) by mouth daily.   potassium chloride SA (KLOR-CON M) 20 MEQ tablet Take 2 tablets (40 mEq total) by mouth daily.   QUEtiapine (SEROQUEL) 300 MG tablet Take 1.5 tablets (450 mg total) by mouth at bedtime.   sacubitril-valsartan (ENTRESTO) 24-26 MG Take 1 tablet by mouth 2 (two) times daily.   Semaglutide, 2 MG/DOSE, (OZEMPIC, 2 MG/DOSE,) 8 MG/3ML SOPN Inject 2 mg into the skin once a week. Monday   spironolactone (ALDACTONE) 25 MG tablet Take 0.5 tablets (12.5 mg total) by mouth at bedtime.   tadalafil (CIALIS) 20 MG tablet Take 20 mg by mouth daily as needed for erectile dysfunction.   TRUEPLUS PEN NEEDLES 31G X 6 MM MISC USE AS DIRECTED   zinc gluconate 50 MG tablet Take 50 mg by mouth daily.   gabapentin (NEURONTIN) 800 MG tablet Take 1 tablet (800 mg total) by mouth every 6 (six) hours.   metFORMIN (GLUCOPHAGE) 500 MG tablet Take 1 tablet (500 mg total) by mouth 2 (two) times daily with a meal.   metoprolol succinate (TOPROL-XL) 50 MG 24 hr tablet Take 1 tablet (50 mg total) by mouth in the  morning and at bedtime. Take with or immediately following a meal.   No facility-administered encounter medications on file as of 11/06/2022.     Review of Systems  Review of Systems  Constitutional: Negative.   HENT: Negative.    Cardiovascular: Negative.   Gastrointestinal: Negative.   Allergic/Immunologic: Negative.   Neurological: Negative.   Psychiatric/Behavioral: Negative.         Physical Exam  BP 125/67   Pulse 73   Ht _0  (1.88 m)   Wt (!) 311 lb (141.1 kg)   SpO2 96%   BMI 39.93 kg/m   Wt Readings from Last 5 Encounters:  11/06/22 (!) 311 lb (141.1 kg)  11/01/22 (!) 315 lb 12.8 oz (143.2 kg)  10/30/22 (!) 310 lb (140.6 kg)  10/25/22 (!) 310 lb (140.6 kg)  10/10/22 (!) 301 lb 12.8 oz (136.9 kg)  Physical Exam Vitals and nursing note reviewed.  Constitutional:      General: He is not in acute distress.    Appearance: He is well-developed.  Cardiovascular:     Rate and Rhythm: Normal rate and regular rhythm.  Pulmonary:     Effort: Pulmonary effort is normal.     Breath sounds: Normal breath sounds.  Skin:    General: Skin is warm and dry.  Neurological:     Mental Status: He is alert and oriented to person, place, and time.      Assessment & Plan:   Type 2 diabetes mellitus with hyperglycemia, with long-term current use of insulin (HCC)  Lab Results  Component Value Date   HGBA1C 6.9 (A) 11/06/2022    - POCT glycosylated hemoglobin (Hb A1C)  2. Erectile dysfunction, unspecified erectile dysfunction type  - Ambulatory referral to Urology    Follow up:  Follow up in 3 months or sooner if needed     Fenton Foy, NP 11/06/2022

## 2022-11-06 NOTE — Patient Instructions (Addendum)
1. Type 2 diabetes mellitus with hyperglycemia, with long-term current use of insulin   Lab Results  Component Value Date   HGBA1C 6.9 (A) 11/06/2022    - POCT glycosylated hemoglobin (Hb A1C)  2. Erectile dysfunction, unspecified erectile dysfunction type  - Ambulatory referral to Urology    Follow up:  Follow up in 3 months or sooner if needed

## 2022-11-08 NOTE — Telephone Encounter (Signed)
Caller & Relationship to patient:  MRN #  [id number]   Call Back Number:   Date of Last Office Visit: 09/13/2022     Date of Next Office Visit: 12/01/2022    Medication(s) to be Refilled: Gaut med  Preferred Pharmacy:   ** Please notify patient to allow 48-72 hours to process** **Let patient know to contact pharmacy at the end of the day to make sure medication is ready. ** **If patient has not been seen in a year or longer, book an appointment **Advise to use MyChart for refill requests OR to contact their pharmacy

## 2022-11-09 ENCOUNTER — Other Ambulatory Visit: Payer: Self-pay | Admitting: Nurse Practitioner

## 2022-11-09 DIAGNOSIS — R7309 Other abnormal glucose: Secondary | ICD-10-CM

## 2022-11-09 DIAGNOSIS — E1165 Type 2 diabetes mellitus with hyperglycemia: Secondary | ICD-10-CM

## 2022-11-14 ENCOUNTER — Other Ambulatory Visit (HOSPITAL_COMMUNITY): Payer: Self-pay

## 2022-11-14 ENCOUNTER — Other Ambulatory Visit: Payer: Self-pay | Admitting: Nurse Practitioner

## 2022-11-14 ENCOUNTER — Telehealth (HOSPITAL_COMMUNITY): Payer: Self-pay

## 2022-11-14 ENCOUNTER — Other Ambulatory Visit (HOSPITAL_COMMUNITY): Payer: Self-pay | Admitting: Internal Medicine

## 2022-11-14 ENCOUNTER — Other Ambulatory Visit (HOSPITAL_COMMUNITY): Payer: Self-pay | Admitting: Family Medicine

## 2022-11-14 NOTE — Progress Notes (Signed)
Paramedicine Encounter    Patient ID: [name], male    DOB: [birth year], 61 y.o.   MRN: [id number]   Arrived for home visit for [name] who was alert and oriented reporting to be having some pain in his left foot in which he thinks is gout. He reports this is the same pain he had last time and in the same foot. He is requesting colchicine- I advised I would send message to PCP as he has no refills left on same. He agreed with plan.   I reviewed meds and filled pill box for two weeks with refills called in as noted: -Jardiance (needs new RX) -Lasix (needs new RX) -Potassium   I obtained vitals as noted: WT- 310lbs BP- 102/62 HR- 50 irregular RR- 18 O2- 90%  Lungs- clear Swelling- none   I reviewed upcoming appointments and confirmed same.   We reviewed his new SS income letter and I explained same.   We discussed HF and DM management with diet and med adherence suggestions, he verbalized understanding.   Home visit complete. I will see [name] is two weeks. He knows to reach out if needed and I will update him if I learn about colchicine being sent in. He appreciated same.   [name], [place] 11/14/2022    Patient Care Team: Fenton Foy, NP as PCP - General (Pulmonary Disease) [name], MD as PCP - Cardiology (Cardiology) [name], MD as PCP - Advanced Heart Failure (Cardiology) [name], MD as PCP - Electrophysiology (Cardiology) [name], LCSW as Social Worker (Licensed Clinical Social Worker) [name], MD as Consulting Physician (Gastroenterology)  Patient Active Problem List   Diagnosis Date Noted   Obesity (BMI 35.0-39.9 without comorbidity) 07/18/2022   Cellulitis 06/22/2022   Cellulitis, leg 06/20/2022   Presence of Watchman left atrial appendage closure device 10/28/2021   Atrial fibrillation ([place]) 10/27/2021   Melena    Chronic diastolic CHF (congestive heart failure) ([place])    GI  bleed 06/29/2021   Severe anemia 06/29/2021   Adenomatous polyp of ascending colon    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   Acute blood loss anemia    Angiodysplasia of stomach    Chronic anticoagulation    CHF exacerbation ([place]) 06/15/2020   AKI (acute kidney injury) ([place]) 06/15/2020   CKD (chronic kidney disease), stage III ([place]) 06/15/2020   Anemia due to chronic blood loss    Gastric AVM    Angiodysplasia of duodenum with hemorrhage    Chronic systolic CHF (congestive heart failure) ([place]) 04/05/2020   Anemia 04/05/2020   PAF (paroxysmal atrial fibrillation) ([place]) 04/05/2020   OSA (obstructive sleep apnea) 04/05/2020   Syncope and collapse 04/05/2020   Type 2 diabetes mellitus with hyperglycemia, with long-term current use of insulin ([place]) 12/03/2019   Diabetic polyneuropathy associated with type 2 diabetes mellitus ([place]) 12/03/2019   Erectile dysfunction 12/03/2019   Paranoid schizophrenia, chronic condition ([place]) 01/26/2015   Severe recurrent major depressive disorder with psychotic features ([place]) 01/26/2015   GAD (generalized anxiety disorder) 01/26/2015   OCD (obsessive compulsive disorder) 01/26/2015   Panic disorder without agoraphobia 01/26/2015   Insomnia 01/26/2015    Current Outpatient Medications:    acetaminophen (TYLENOL) 500 MG tablet, Take 2 tablets (1,000 mg total) by mouth 2 (two) times daily as needed., Disp: 180 tablet, Rfl: 3   albuterol (VENTOLIN HFA) 108 (90 Base) MCG/ACT inhaler, Inhale 2 puffs into the lungs every 6 (six) hours  as needed for wheezing or shortness of breath., Disp: 18 g, Rfl: 1   aspirin EC 81 MG tablet, Take 1 tablet (81 mg total) by mouth daily. Swallow whole., Disp: 90 tablet, Rfl: 3   atorvastatin (LIPITOR) 40 MG tablet, TAKE 1 TABLET BY MOUTH ONCE DAILY NEEDS  FOLLOW  UP  APPOINTMENT  FOR  ANYMORE  REFILLS, Disp: 30 tablet, Rfl: 0   blood glucose meter kit and supplies KIT, Dispense based on patient and  insurance preference. Use up to four times daily as directed. (FOR ICD-9 250.00, 250.01)., Disp: 1 each, Rfl: 0   colchicine 0.6 MG tablet, Daily for 7 days then daily PRN for gout flare, Disp: 30 tablet, Rfl: 0   Continuous Blood Gluc Receiver (FREESTYLE LIBRE 14 DAY READER) DEVI, 1 each by Does not apply route as needed., Disp: 1 each, Rfl: 11   Continuous Blood Gluc Sensor (FREESTYLE LIBRE 14 DAY SENSOR) MISC, USE 1 APPLICATOR EVERY 14 DAYS, Disp: 2 each, Rfl: 0   cyclobenzaprine (FLEXERIL) 10 MG tablet, Take 10 mg by mouth 3 (three) times daily as needed for muscle spasms., Disp: , Rfl:    diclofenac Sodium (VOLTAREN) 1 % GEL, Apply 2 g topically daily as needed for pain., Disp: , Rfl:    empagliflozin (JARDIANCE) 10 MG TABS tablet, Take 1 tablet (10 mg total) by mouth daily. NEEDS FOLLOW UP APPOINTMENT FOR ANYMORE REFILLS, Disp: 60 tablet, Rfl: 0   ferrous sulfate 325 (65 FE) MG tablet, Take 1 tablet (325 mg total) by mouth every other day. (Patient taking differently: Take 325 mg by mouth daily with breakfast.), Disp: 30 tablet, Rfl: 1   furosemide (LASIX) 40 MG tablet, Take 1.5 tablets (60 mg total) by mouth 2 (two) times daily., Disp: 90 tablet, Rfl: 3   gabapentin (NEURONTIN) 800 MG tablet, Take 1 tablet (800 mg total) by mouth every 6 (six) hours., Disp: 120 tablet, Rfl: 5   glipiZIDE (GLUCOTROL) 10 MG tablet, Take 1 tablet (10 mg total) by mouth 2 (two) times daily before a meal., Disp: 180 tablet, Rfl: 0   Insulin Lispro Prot & Lispro (HUMALOG MIX 75/25 KWIKPEN) (75-25) 100 UNIT/ML Kwikpen, Inject 30 Units into the skin 2 (two) times daily., Disp: 54 mL, Rfl: 1   Insulin Syringe-Needle U-100 (TRUEPLUS INSULIN SYRINGE) 31G X 5/16" 0.3 ML MISC, USE 3 TIMES DAILY TO INJECT INSULIN, Disp: 300 each, Rfl: 3   LANTUS 100 UNIT/ML injection, INJECT 0.2 MLS (20 UNITS TOTAL) INTO THE SKIN DAILY, Disp: 30 mL, Rfl: 0   metFORMIN (GLUCOPHAGE) 500 MG tablet, Take 1 tablet (500 mg total) by mouth 2 (two)  times daily with a meal., Disp: 60 tablet, Rfl: 2   metolazone (ZAROXOLYN) 2.5 MG tablet, Take 1 tablet (2.5 mg total) by mouth as directed., Disp: 5 tablet, Rfl: 2   metoprolol succinate (TOPROL-XL) 50 MG 24 hr tablet, Take 1 tablet (50 mg total) by mouth in the morning and at bedtime. Take with or immediately following a meal., Disp: 180 tablet, Rfl: 2   Multiple Vitamins-Minerals (CENTRUM SILVER 50+MEN) TABS, Take 1 tablet by mouth daily., Disp: , Rfl:    mupirocin ointment (BACTROBAN) 2 %, Apply 1 Application topically 2 (two) times daily., Disp: 22 g, Rfl: 0   Oxycodone HCl 10 MG TABS, Take 10 mg by mouth 3 (three) times daily., Disp: , Rfl:    pantoprazole (PROTONIX) 40 MG tablet, Take 1 tablet (40 mg total) by mouth daily., Disp: 90 tablet, Rfl: 1  potassium chloride SA (KLOR-CON M) 20 MEQ tablet, Take 2 tablets (40 mEq total) by mouth daily., Disp: 60 tablet, Rfl: 6   QUEtiapine (SEROQUEL) 300 MG tablet, Take 1.5 tablets (450 mg total) by mouth at bedtime., Disp: 135 tablet, Rfl: 1   sacubitril-valsartan (ENTRESTO) 24-26 MG, Take 1 tablet by mouth 2 (two) times daily., Disp: 180 tablet, Rfl: 3   Semaglutide, 2 MG/DOSE, (OZEMPIC, 2 MG/DOSE,) 8 MG/3ML SOPN, Inject 2 mg into the skin once a week. Monday, Disp: , Rfl:    spironolactone (ALDACTONE) 25 MG tablet, Take 0.5 tablets (12.5 mg total) by mouth at bedtime., Disp: 45 tablet, Rfl: 3   tadalafil (CIALIS) 20 MG tablet, Take 20 mg by mouth daily as needed for erectile dysfunction., Disp: , Rfl:    TRUEPLUS PEN NEEDLES 31G X 6 MM MISC, USE AS DIRECTED, Disp: 100 each, Rfl: 0   zinc gluconate 50 MG tablet, Take 50 mg by mouth daily., Disp: , Rfl:  No Known Allergies   Social History   Socioeconomic History   Marital status: Legally Separated    Spouse name: Not on file   Number of children: Not on file   Years of education: Not on file   Highest education level: Not on file  Occupational History   Not on file  Tobacco Use   Smoking  status: Never    Passive exposure: Never   Smokeless tobacco: Never  Vaping Use   Vaping Use: Never used  Substance and Sexual Activity   Alcohol use: Not Currently    Alcohol/week: 2.0 standard drinks of alcohol    Types: 2 Cans of beer per week   Drug use: No   Sexual activity: Yes    Birth control/protection: None  Other Topics Concern   Not on file  Social History Narrative   Not on file   Social Determinants of Health   Financial Resource Strain: Low Risk  (12/23/2019)   Overall Financial Resource Strain (CARDIA)    Difficulty of Paying Living Expenses: Not very hard  Food Insecurity: No Food Insecurity (07/01/2021)   Hunger Vital Sign    Worried About Running Out of Food in the Last Year: Never true    Ran Out of Food in the Last Year: Never true  Transportation Needs: Unmet Transportation Needs (07/01/2021)   PRAPARE - Hydrologist (Medical): Yes    Lack of Transportation (Non-Medical): Yes  Physical Activity: Not on file  Stress: Not on file  Social Connections: Not on file  Intimate Partner Violence: Not on file    Physical Exam      Future Appointments  Date Time Provider [place]  12/01/2022  7:40 AM [facility] DEVICE REMOTES [facility]  01/29/2023  2:40 PM [name], MD [facility] None  02/05/2023 10:40 AM Fenton Foy, NP [place] None  03/02/2023  7:40 AM [facility] DEVICE REMOTES [facility]  06/01/2023  7:40 AM [facility] DEVICE REMOTES [facility]     ACTION: Home visit completed

## 2022-11-14 NOTE — Telephone Encounter (Signed)
Mr. Micciche reports having severe pain being unable to bear weight on his left foot and states it feels the same as when he had a gout flare up. During home visit I noticed him having trouble walking due to this. He is requesting colchicine for same however he has none on hand or any refills at the pharmacy available. I advised him I would forward to PCP for further.   [name], [place] 11/14/2022

## 2022-11-15 ENCOUNTER — Other Ambulatory Visit: Payer: Self-pay | Admitting: Nurse Practitioner

## 2022-11-15 MED ORDER — COLCHICINE 0.6 MG PO TABS: ORAL_TABLET | ORAL | 0 refills | Status: DC

## 2022-11-15 NOTE — Telephone Encounter (Signed)
Refill placed. Thanks.

## 2022-11-21 ENCOUNTER — Telehealth: Payer: Self-pay | Admitting: Nurse Practitioner

## 2022-11-21 ENCOUNTER — Other Ambulatory Visit: Payer: Self-pay | Admitting: Nurse Practitioner

## 2022-11-21 DIAGNOSIS — E1165 Type 2 diabetes mellitus with hyperglycemia: Secondary | ICD-10-CM

## 2022-11-21 DIAGNOSIS — R7309 Other abnormal glucose: Secondary | ICD-10-CM

## 2022-11-21 NOTE — Telephone Encounter (Signed)
Caller & Relationship to patient:  MRN #  [id number]   Call Back Number: [phone]  Date of Last Office Visit: 11/14/2022     Date of Next Office Visit: 02/05/2023    Medication(s) to be Refilled: Metformin  Preferred Pharmacy: [facility] on [place]  ** Please notify patient to allow 48-72 hours to process** **Let patient know to contact pharmacy at the end of the day to make sure medication is ready. ** **If patient has not been seen in a year or longer, book an appointment **Advise to use MyChart for refill requests OR to contact their pharmacy

## 2022-11-23 ENCOUNTER — Other Ambulatory Visit: Payer: Self-pay | Admitting: Nurse Practitioner

## 2022-11-23 MED ORDER — METFORMIN HCL 500 MG PO TABS: 500.0000 mg | ORAL_TABLET | Freq: Two times a day (BID) | ORAL | 2 refills | Status: DC

## 2022-11-28 ENCOUNTER — Telehealth: Payer: Self-pay

## 2022-11-28 NOTE — Progress Notes (Signed)
[facility])                                            [place] Team    11/28/2022  [name] 1961-02-09 [id number]   [name])                                            [place] Team   Received patient and provider portion(s) of patient assistance application(s) for Humalog. Faxed completed application and required documents into [employer].    Lelon Huh, [place] [facility] [phone]

## 2022-11-29 ENCOUNTER — Other Ambulatory Visit (HOSPITAL_COMMUNITY): Payer: Self-pay

## 2022-11-29 NOTE — Progress Notes (Signed)
Came out for a med rec.   2 wks of pill boxes refilled.   2nd pill box he is missing the seroquel in the fri/sat PM doses.  He does not have the lasix and potassium bottle-spoke to heather and she will call pharmacy to see what is going on with that-she did call it in last week.   So he does not have the lasix or potassium in either pill boxes.   He will also need gabapentin refilled.    Marylouise Stacks, [place] 11/29/2022

## 2022-11-29 NOTE — Progress Notes (Unsigned)
Paramedicine Encounter    Patient ID: MARQUAL MI, male    DOB: November 21, 1961, 62 y.o.   MRN: [id number]  Planned visit with Mr. Mortensen- my visit was carried out by Marylouise Stacks- [facility] as I was unable to see him. Her note reflects their visit.   We spoke on the phone after and she made me aware of several refills needed for him. I contacted [facility] with that list and got them to fill same. They should be ready for pickup by 11/29/22 evening hours. I called [name] and made him aware once he picked up same to call me and let me know so I could come by and reconcile his medications. He agreed with same.   I will follow up accordingly.   [name], [place] 11/30/2022   ACTION: Home visit completed

## 2022-12-01 ENCOUNTER — Ambulatory Visit (INDEPENDENT_AMBULATORY_CARE_PROVIDER_SITE_OTHER): Payer: Medicare Other

## 2022-12-01 DIAGNOSIS — I480 Paroxysmal atrial fibrillation: Secondary | ICD-10-CM

## 2022-12-01 LAB — CUP PACEART REMOTE DEVICE CHECK
Date Time Interrogation Session: 20240105095712
Implantable Lead Connection Status: 753985
Implantable Lead Implant Date: 20211008
Implantable Lead Location: 753860
Implantable Lead Model: 436910
Implantable Lead Serial Number: 81404997
Implantable Pulse Generator Implant Date: 20211008
Pulse Gen Model: 429525
Pulse Gen Serial Number: 84810752

## 2022-12-05 ENCOUNTER — Other Ambulatory Visit (HOSPITAL_COMMUNITY): Payer: Self-pay

## 2022-12-05 NOTE — Progress Notes (Signed)
Paramedicine Encounter  Patient ID: [name], male    DOB: 1961/09/14, 62 y.o.   MRN: [id number]   Complaints- Pain in left foot/gout related.   Assessment- No lower leg edema. Lungs clear. Abdomen non-distended.   Compliance with meds- missed a few doses over the last week.   Pill box filled-for two weeks   Refills needed- Gabapentin, Seroquel, Freestyle Sensor   Meds changes since last visit- none     Social changes- applying for [employer], I am working on same.    BP (!) 140/76   Pulse 76   Resp 16   Wt (!) 311 lb (141.1 kg)   SpO2 96%   BMI 39.93 kg/m  Weight yesterday-did not weigh yesterday Last visit weight-310lbs   Arrived for home visit for [name] who reports to be feeling okay other than having some left foot pain which he feels is related to the Gout. He is using colchicine as needed but feels it is not working. He requested a PCP appointment for follow up. I scheduled same for tomorrow however he report he has a conflicting psychiatry appointment at that time. I will call back to reschedule in the AM.   He denied shortness of breath, dizziness, chest pain or swelling.  He has missed a few doses of his meds over the last week. Mostly noon and bedtime doses. I reviewed with him the importance of med compliance and the need for him to comply to feel better and keep him at home and healthy- he verbalized understanding. I reviewed meds and filled pill box for two weeks. Refills as noted and called into [facility].   He states his pain has gotten worse and his pain management doctor at [facility] is no longer going to see him and told him to talk to his PCP about a new referral for pain management. He states this is due to the doctor finding a "purple" substance in his urine. He failed to mention to the doctor that he had been using a purple detox drink because he admits he was around [place] on new years.   He has been taking his insulin but is out of  Ozempic- awaiting shipment from Patient Assistance. I will reach out to pharmacist Marcelle Overlie for follow up.   We reviewed HF and DM management and need to stay on track with managing both. He agreed and plans to do better.   I scheduled eye doctor appointment for [facility] for 4/3 at 230.   Home visit complete. I will see [name] in one week.    [name], [place]   Patient Care Team: Fenton Foy, NP as PCP - General (Pulmonary Disease) [name], MD as PCP - Cardiology (Cardiology) [name], MD as PCP - Advanced Heart Failure (Cardiology) [name], MD as PCP - Electrophysiology (Cardiology) [name], LCSW as Social Worker (Licensed Clinical Social Worker) [name], MD as Consulting Physician (Gastroenterology)  Patient Active Problem List   Diagnosis Date Noted   Obesity (BMI 35.0-39.9 without comorbidity) 07/18/2022   Cellulitis 06/22/2022   Cellulitis, leg 06/20/2022   Presence of Watchman left atrial appendage closure device 10/28/2021   Atrial fibrillation ([place]) 10/27/2021   Melena    Chronic diastolic CHF (congestive heart failure) ([place])    GI bleed 06/29/2021   Severe anemia 06/29/2021   Adenomatous polyp of ascending colon    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   Acute blood  loss anemia    Angiodysplasia of stomach    Chronic anticoagulation    CHF exacerbation ([place]) 06/15/2020   AKI (acute kidney injury) ([place]) 06/15/2020   CKD (chronic kidney disease), stage III ([place]) 06/15/2020   Anemia due to chronic blood loss    Gastric AVM    Angiodysplasia of duodenum with hemorrhage    Chronic systolic CHF (congestive heart failure) ([place]) 04/05/2020   Anemia 04/05/2020   PAF (paroxysmal atrial fibrillation) ([place]) 04/05/2020   OSA (obstructive sleep apnea) 04/05/2020   Syncope and collapse 04/05/2020   Type 2 diabetes mellitus with hyperglycemia, with long-term  current use of insulin ([place]) 12/03/2019   Diabetic polyneuropathy associated with type 2 diabetes mellitus ([place]) 12/03/2019   Erectile dysfunction 12/03/2019   Paranoid schizophrenia, chronic condition ([place]) 01/26/2015   Severe recurrent major depressive disorder with psychotic features ([name]) 01/26/2015   GAD (generalized anxiety disorder) 01/26/2015   OCD (obsessive compulsive disorder) 01/26/2015   Panic disorder without agoraphobia 01/26/2015   Insomnia 01/26/2015    Current Outpatient Medications:    acetaminophen (TYLENOL) 500 MG tablet, Take 2 tablets (1,000 mg total) by mouth 2 (two) times daily as needed., Disp: 180 tablet, Rfl: 3   albuterol (VENTOLIN HFA) 108 (90 Base) MCG/ACT inhaler, Inhale 2 puffs into the lungs every 6 (six) hours as needed for wheezing or shortness of breath., Disp: 18 g, Rfl: 1   aspirin EC 81 MG tablet, Take 1 tablet (81 mg total) by mouth daily. Swallow whole., Disp: 90 tablet, Rfl: 3   atorvastatin (LIPITOR) 40 MG tablet, TAKE 1 TABLET BY MOUTH ONCE DAILY NEEDS  FOLLOW  UP  APPOINTMENT  FOR  ANYMORE  REFILLS, Disp: 30 tablet, Rfl: 0   blood glucose meter kit and supplies KIT, Dispense based on patient and insurance preference. Use up to four times daily as directed. (FOR ICD-9 250.00, 250.01)., Disp: 1 each, Rfl: 0   colchicine 0.6 MG tablet, Daily for 7 days then daily PRN for gout flare, Disp: 30 tablet, Rfl: 0   Continuous Blood Gluc Receiver (FREESTYLE LIBRE 14 DAY READER) DEVI, 1 each by Does not apply route as needed., Disp: 1 each, Rfl: 11   Continuous Blood Gluc Sensor (FREESTYLE LIBRE 14 DAY SENSOR) MISC, USE 1 APPLICATOR EVERY 14 DAYS, Disp: 2 each, Rfl: 0   cyclobenzaprine (FLEXERIL) 10 MG tablet, Take 10 mg by mouth 3 (three) times daily as needed for muscle spasms., Disp: , Rfl:    diclofenac Sodium (VOLTAREN) 1 % GEL, Apply 2 g topically daily as needed for pain., Disp: , Rfl:    ferrous sulfate 325 (65 FE) MG tablet, Take 1 tablet (325 mg  total) by mouth every other day. (Patient taking differently: Take 325 mg by mouth daily with breakfast.), Disp: 30 tablet, Rfl: 1   furosemide (LASIX) 40 MG tablet, TAKE 1 & 1/2 (ONE & ONE-HALF) TABLETS BY MOUTH TWICE DAILY, Disp: 90 tablet, Rfl: 0   gabapentin (NEURONTIN) 800 MG tablet, Take 1 tablet (800 mg total) by mouth every 6 (six) hours., Disp: 120 tablet, Rfl: 5   glipiZIDE (GLUCOTROL) 10 MG tablet, Take 1 tablet (10 mg total) by mouth 2 (two) times daily before a meal., Disp: 180 tablet, Rfl: 0   HUMALOG MIX 75/25 KWIKPEN (75-25) 100 UNIT/ML KwikPen, DIAL AND INJECT 30 UNITS UNDER THE SKIN TWICE DAILY, Disp: 75 mL, Rfl: 0   Insulin Syringe-Needle U-100 (TRUEPLUS INSULIN SYRINGE) 31G X 5/16" 0.3 ML MISC, USE 3 TIMES DAILY TO  INJECT INSULIN, Disp: 300 each, Rfl: 3   JARDIANCE 10 MG TABS tablet, TAKE 1 TABLET BY MOUTH ONCE DAILY . APPOINTMENT REQUIRED FOR FUTURE REFILLS, Disp: 60 tablet, Rfl: 0   LANTUS 100 UNIT/ML injection, INJECT 0.2 MLS (20 UNITS TOTAL) INTO THE SKIN DAILY, Disp: 30 mL, Rfl: 0   metFORMIN (GLUCOPHAGE) 500 MG tablet, Take 1 tablet (500 mg total) by mouth 2 (two) times daily with a meal., Disp: 60 tablet, Rfl: 2   metolazone (ZAROXOLYN) 2.5 MG tablet, Take 1 tablet (2.5 mg total) by mouth as directed., Disp: 5 tablet, Rfl: 2   metoprolol succinate (TOPROL-XL) 50 MG 24 hr tablet, Take 1 tablet (50 mg total) by mouth in the morning and at bedtime. Take with or immediately following a meal., Disp: 180 tablet, Rfl: 2   Multiple Vitamins-Minerals (CENTRUM SILVER 50+MEN) TABS, Take 1 tablet by mouth daily., Disp: , Rfl:    mupirocin ointment (BACTROBAN) 2 %, Apply 1 Application topically 2 (two) times daily., Disp: 22 g, Rfl: 0   Oxycodone HCl 10 MG TABS, Take 10 mg by mouth 3 (three) times daily., Disp: , Rfl:    pantoprazole (PROTONIX) 40 MG tablet, Take 1 tablet (40 mg total) by mouth daily., Disp: 90 tablet, Rfl: 1   potassium chloride SA (KLOR-CON M) 20 MEQ tablet, Take 2  tablets (40 mEq total) by mouth daily., Disp: 60 tablet, Rfl: 6   QUEtiapine (SEROQUEL) 300 MG tablet, Take 1.5 tablets (450 mg total) by mouth at bedtime., Disp: 135 tablet, Rfl: 1   sacubitril-valsartan (ENTRESTO) 24-26 MG, Take 1 tablet by mouth 2 (two) times daily., Disp: 180 tablet, Rfl: 3   Semaglutide, 2 MG/DOSE, (OZEMPIC, 2 MG/DOSE,) 8 MG/3ML SOPN, Inject 2 mg into the skin once a week. Monday, Disp: , Rfl:    spironolactone (ALDACTONE) 25 MG tablet, Take 0.5 tablets (12.5 mg total) by mouth at bedtime., Disp: 45 tablet, Rfl: 3   tadalafil (CIALIS) 20 MG tablet, Take 20 mg by mouth daily as needed for erectile dysfunction., Disp: , Rfl:    TRUEPLUS PEN NEEDLES 31G X 6 MM MISC, USE AS DIRECTED, Disp: 100 each, Rfl: 0   zinc gluconate 50 MG tablet, Take 50 mg by mouth daily., Disp: , Rfl:  No Known Allergies   Social History   Socioeconomic History   Marital status: Legally Separated    Spouse name: Not on file   Number of children: Not on file   Years of education: Not on file   Highest education level: Not on file  Occupational History   Not on file  Tobacco Use   Smoking status: Never    Passive exposure: Never   Smokeless tobacco: Never  Vaping Use   Vaping Use: Never used  Substance and Sexual Activity   Alcohol use: Not Currently    Alcohol/week: 2.0 standard drinks of alcohol    Types: 2 Cans of beer per week   Drug use: No   Sexual activity: Yes    Birth control/protection: None  Other Topics Concern   Not on file  Social History Narrative   Not on file   Social Determinants of Health   Financial Resource Strain: Low Risk  (12/23/2019)   Overall Financial Resource Strain (CARDIA)    Difficulty of Paying Living Expenses: Not very hard  Food Insecurity: No Food Insecurity (07/01/2021)   Hunger Vital Sign    Worried About Running Out of Food in the Last Year: Never true  Ran Out of Food in the Last Year: Never true  Transportation Needs: Unmet Transportation  Needs (07/01/2021)   PRAPARE - Hydrologist (Medical): Yes    Lack of Transportation (Non-Medical): Yes  Physical Activity: Not on file  Stress: Not on file  Social Connections: Not on file  Intimate Partner Violence: Not on file        Future Appointments  Date Time Provider [place]  12/06/2022  9:40 AM Fenton Foy, NP [place] None  01/29/2023  2:40 PM [name], MD [facility] None  02/05/2023 10:40 AM Fenton Foy, NP [facility] None  03/02/2023  7:40 AM [facility] DEVICE REMOTES [facility]  06/01/2023  7:40 AM [facility] DEVICE REMOTES [facility]        ACTION: Home visit completed

## 2022-12-06 ENCOUNTER — Ambulatory Visit: Payer: Self-pay | Admitting: Nurse Practitioner

## 2022-12-08 ENCOUNTER — Other Ambulatory Visit: Payer: Self-pay | Admitting: Nurse Practitioner

## 2022-12-12 ENCOUNTER — Telehealth: Payer: Self-pay | Admitting: Pharmacist

## 2022-12-12 NOTE — Telephone Encounter (Signed)
Patient picked up sample of Ozempic 0.25/0.'5mg'$  to hold him until we hear back from patient assistance.

## 2022-12-13 ENCOUNTER — Ambulatory Visit: Payer: Self-pay | Admitting: Nurse Practitioner

## 2022-12-13 ENCOUNTER — Telehealth: Payer: Self-pay

## 2022-12-13 DIAGNOSIS — Z596 Low income: Secondary | ICD-10-CM

## 2022-12-13 NOTE — Progress Notes (Signed)
[facility])                                            [place] Team    12/13/2022  JEZREEL JUSTINIANO [birth year] [id number]  Care coordination called placed to [facility] in regards to Humalog.  Spoke with Tejashwini who informs patient has been approved through 11/27/2023.  A 120 DS of the medication will be shipped to the patients home and he is on automatic refills.  Patient is aware of approval.  Lelon Huh, [place] [phone]

## 2022-12-18 NOTE — Progress Notes (Signed)
Remote ICD transmission.   

## 2022-12-19 ENCOUNTER — Telehealth (HOSPITAL_COMMUNITY): Payer: Self-pay

## 2022-12-19 NOTE — Telephone Encounter (Signed)
Attempted to reach Mr. Torian to schedule home paramedicine visit. No answer on calls X2- left voicemails for him to return my call. I will continue to follow up.   [name], [place] 12/19/2022

## 2022-12-20 ENCOUNTER — Other Ambulatory Visit (HOSPITAL_COMMUNITY): Payer: Self-pay

## 2022-12-20 NOTE — Progress Notes (Signed)
Paramedicine Encounter    Patient ID: [name], male    DOB: 1961-06-22, 62 y.o.   MRN: [id number]  [name] returned my call and requested a home visit today for a medication review and pill box refill.   I arrived and he was ambulating around his home with no shortness of breath, dizziness, chest pain, swelling or trouble urinating. He reports normal bowel movements with no bleeding.   He has missed several doses of noon and beditme meds over the last two weeks. I discussed meed compliance with him and the importance of same. He verbalized understanding.   I reviewed meds and filled pill box for one week. Refills: -Potassium -Lasix -Spironolactone -Freestyle Sensors   I will call in same to [place].   I assisted him in rescheduling his PCP visit he missed last week. I wrote down same with date, time and address.   Home visit complete. I will see [name] in two weeks.   [name], EMT-Paramedic [phone] 12/20/2022     ACTION: Home visit completed

## 2022-12-22 ENCOUNTER — Encounter: Payer: Self-pay | Admitting: Nurse Practitioner

## 2022-12-22 ENCOUNTER — Ambulatory Visit (INDEPENDENT_AMBULATORY_CARE_PROVIDER_SITE_OTHER): Payer: Medicare Other | Admitting: Nurse Practitioner

## 2022-12-22 VITALS — BP 118/67 | HR 76 | Temp 97.6°F | Ht 73.75 in | Wt 309.6 lb

## 2022-12-22 DIAGNOSIS — G8929 Other chronic pain: Secondary | ICD-10-CM | POA: Diagnosis not present

## 2022-12-22 DIAGNOSIS — M2550 Pain in unspecified joint: Secondary | ICD-10-CM

## 2022-12-22 MED ORDER — SILDENAFIL CITRATE 100 MG PO TABS: 50.0000 mg | ORAL_TABLET | Freq: Every day | ORAL | 11 refills | Status: DC | PRN

## 2022-12-22 MED ORDER — TADALAFIL 20 MG PO TABS: 20.0000 mg | ORAL_TABLET | Freq: Every day | ORAL | 2 refills | Status: DC | PRN

## 2022-12-22 NOTE — Progress Notes (Signed)
$'@Patient'P$  ID: [name], male    DOB: January 14, 1961, 62 y.o.   MRN: [id number]  Chief Complaint  Patient presents with   Pain    Unsure if gout, arthritis etc, hurts all over in joints. Needs pain management referral. Wants a rx for viagra instead of cialis, cialis doesn't work.     Referring provider: Fenton Foy, NP   HPI  [name] 62 y.o. male  has a past medical history of AICD (automatic cardioverter/defibrillator) present, Anxiety, Atrial fibrillation ([place]), CAD (coronary artery disease), Cardiomyopathy ([place]), CHF (congestive heart failure) ([place]) (09/2019), Depression, Diabetes mellitus, Erectile dysfunction (11/2019), GI bleeding, H/O right heart catheterization (09/2019), Hypertension, Presence of permanent cardiac pacemaker, Presence of Watchman left atrial appendage closure device (10/27/2021), Schizophrenia ([place]), and Sleep apnea.    Patient presents today for an acute visit.  He complains today of multiple joint pain.  He states that this has been a chronic issue but has gotten worse recently.  He is requesting a referral for pain management.  We will check inflammatory markers today to rule out underlying cause.    No Known Allergies  Immunization History  Administered Date(s) Administered   Influenza,inj,Quad PF,6+ Mos 10/13/2019, 09/13/2020, 08/03/2021, 11/01/2022   PFIZER(Purple Top)SARS-COV-2 Vaccination 03/13/2020   Pneumococcal Polysaccharide-23 10/16/2019   Tdap 09/13/2020   Zoster Recombinat (Shingrix) 08/03/2021    Past Medical History:  Diagnosis Date   AICD (automatic cardioverter/defibrillator) present    Anxiety    Atrial fibrillation (H[facility])    CAD (coronary artery disease)    Cardiomyopathy ([place])    CHF (congestive heart failure) ([place]) 09/2019   Depression    Diabetes mellitus    Erectile dysfunction 11/2019   GI bleeding    H/O right heart catheterization 09/2019   Hypertension    Presence of permanent cardiac pacemaker     Presence of Watchman left atrial appendage closure device 10/27/2021   27 mm Watchman Flex Device per Dr. Quentin Ore   Schizophrenia [facility])    Sleep apnea    uses cpap    Tobacco History: Social History   Tobacco Use  Smoking Status Never   Passive exposure: Never  Smokeless Tobacco Never   Counseling given: Not Answered   Outpatient Encounter Medications as of 12/22/2022  Medication Sig   acetaminophen (TYLENOL) 500 MG tablet Take 2 tablets (1,000 mg total) by mouth 2 (two) times daily as needed.   albuterol (VENTOLIN HFA) 108 (90 Base) MCG/ACT inhaler Inhale 2 puffs into the lungs every 6 (six) hours as needed for wheezing or shortness of breath.   aspirin EC 81 MG tablet Take 1 tablet (81 mg total) by mouth daily. Swallow whole.   atorvastatin (LIPITOR) 40 MG tablet TAKE 1 TABLET BY MOUTH ONCE DAILY NEEDS  FOLLOW  UP  APPOINTMENT  FOR  ANYMORE  REFILLS   blood glucose meter kit and supplies KIT Dispense based on patient and insurance preference. Use up to four times daily as directed. (FOR ICD-9 250.00, 250.01).   colchicine 0.6 MG tablet Daily for 7 days then daily PRN for gout flare   Continuous Blood Gluc Receiver (FREESTYLE LIBRE 14 DAY READER) DEVI 1 each by Does not apply route as needed.   Continuous Blood Gluc Sensor (FREESTYLE LIBRE 14 DAY SENSOR) MISC USE 1 APPLICATOR EVERY 14 DAYS   cyclobenzaprine (FLEXERIL) 10 MG tablet Take 10 mg by mouth 3 (three) times daily as needed for muscle spasms.   diclofenac Sodium (VOLTAREN) 1 % GEL  Apply 2 g topically daily as needed for pain.   ferrous sulfate 325 (65 FE) MG tablet Take 1 tablet (325 mg total) by mouth every other day. (Patient taking differently: Take 325 mg by mouth daily with breakfast.)   furosemide (LASIX) 40 MG tablet TAKE 1 & 1/2 (ONE & ONE-HALF) TABLETS BY MOUTH TWICE DAILY   gabapentin (NEURONTIN) 800 MG tablet Take 1 tablet (800 mg total) by mouth every 6 (six) hours.   glipiZIDE (GLUCOTROL) 10 MG tablet Take 1  tablet (10 mg total) by mouth 2 (two) times daily before a meal.   HUMALOG MIX 75/25 KWIKPEN (75-25) 100 UNIT/ML KwikPen DIAL AND INJECT 30 UNITS UNDER THE SKIN TWICE DAILY   Insulin Syringe-Needle U-100 (TRUEPLUS INSULIN SYRINGE) 31G X 5/16" 0.3 ML MISC USE 3 TIMES DAILY TO INJECT INSULIN   JARDIANCE 10 MG TABS tablet TAKE 1 TABLET BY MOUTH ONCE DAILY . APPOINTMENT REQUIRED FOR FUTURE REFILLS   LANTUS 100 UNIT/ML injection INJECT 0.2 MLS (20 UNITS TOTAL) INTO THE SKIN DAILY   metFORMIN (GLUCOPHAGE) 500 MG tablet Take 1 tablet (500 mg total) by mouth 2 (two) times daily with a meal.   metolazone (ZAROXOLYN) 2.5 MG tablet Take 1 tablet (2.5 mg total) by mouth as directed.   metoprolol succinate (TOPROL-XL) 50 MG 24 hr tablet Take 1 tablet (50 mg total) by mouth in the morning and at bedtime. Take with or immediately following a meal.   Multiple Vitamins-Minerals (CENTRUM SILVER 50+MEN) TABS Take 1 tablet by mouth daily.   mupirocin ointment (BACTROBAN) 2 % Apply 1 Application topically 2 (two) times daily.   pantoprazole (PROTONIX) 40 MG tablet Take 1 tablet (40 mg total) by mouth daily.   potassium chloride SA (KLOR-CON M) 20 MEQ tablet Take 2 tablets (40 mEq total) by mouth daily.   QUEtiapine (SEROQUEL) 300 MG tablet Take 1.5 tablets (450 mg total) by mouth at bedtime.   sacubitril-valsartan (ENTRESTO) 24-26 MG Take 1 tablet by mouth 2 (two) times daily.   Semaglutide, 2 MG/DOSE, (OZEMPIC, 2 MG/DOSE,) 8 MG/3ML SOPN Inject 2 mg into the skin once a week. Monday   sildenafil (VIAGRA) 100 MG tablet Take 0.5-1 tablets (50-100 mg total) by mouth daily as needed for erectile dysfunction.   spironolactone (ALDACTONE) 25 MG tablet Take 0.5 tablets (12.5 mg total) by mouth at bedtime.   TRUEPLUS PEN NEEDLES 31G X 6 MM MISC USE AS DIRECTED   zinc gluconate 50 MG tablet Take 50 mg by mouth daily.   [DISCONTINUED] tadalafil (CIALIS) 20 MG tablet Take 20 mg by mouth daily as needed for erectile dysfunction.    Oxycodone HCl 10 MG TABS Take 10 mg by mouth 3 (three) times daily. (Patient not taking: Reported on 12/20/2022)   [DISCONTINUED] tadalafil (CIALIS) 20 MG tablet Take 1 tablet (20 mg total) by mouth daily as needed for erectile dysfunction.   No facility-administered encounter medications on file as of 12/22/2022.     Review of Systems  Review of Systems  Constitutional: Negative.   HENT: Negative.    Cardiovascular: Negative.   Gastrointestinal: Negative.   Allergic/Immunologic: Negative.   Neurological: Negative.   Psychiatric/Behavioral: Negative.         Physical Exam  BP 118/67   Pulse 76   Temp 97.6 F (36.4 C) (Temporal)   Ht 6' 1.75" (1.873 m)   Wt (!) 309 lb 9.6 oz (140.4 kg)   SpO2 98%   BMI 40.02 kg/m   Wt Readings from Last  5 Encounters:  12/22/22 (!) 309 lb 9.6 oz (140.4 kg)  12/05/22 (!) 311 lb (141.1 kg)  11/14/22 (!) 310 lb (140.6 kg)  11/06/22 (!) 311 lb (141.1 kg)  11/01/22 (!) 315 lb 12.8 oz (143.2 kg)     Physical Exam Vitals and nursing note reviewed.  Constitutional:      General: He is not in acute distress.    Appearance: He is well-developed.  Cardiovascular:     Rate and Rhythm: Normal rate and regular rhythm.  Pulmonary:     Effort: Pulmonary effort is normal.     Breath sounds: Normal breath sounds.  Skin:    General: Skin is warm and dry.  Neurological:     Mental Status: He is alert and oriented to person, place, and time.       Assessment & Plan:   Multiple joint pain - Rheumatoid factor - Sedimentation Rate - C-reactive protein - Uric Acid - CBC - Comprehensive metabolic panel - Ambulatory referral to Physical Medicine Rehab   2. Other chronic pain  - Ambulatory referral to Physical Medicine Rehab    Follow up:  Follow up in 3 months     Fenton Foy, NP 12/22/2022

## 2022-12-22 NOTE — Patient Instructions (Addendum)
1. Multiple joint pain  - Rheumatoid factor - Sedimentation Rate - C-reactive protein - Uric Acid - CBC - Comprehensive metabolic panel - Ambulatory referral to Physical Medicine Rehab   2. Other chronic pain  - Ambulatory referral to Physical Medicine Rehab    Follow up:  Follow up in 3 months

## 2022-12-22 NOTE — Assessment & Plan Note (Signed)
-  Rheumatoid factor - Sedimentation Rate - C-reactive protein - Uric Acid - CBC - Comprehensive metabolic panel - Ambulatory referral to Physical Medicine Rehab   2. Other chronic pain  - Ambulatory referral to Physical Medicine Rehab    Follow up:  Follow up in 3 months

## 2022-12-23 LAB — RHEUMATOID FACTOR: Rheumatoid fact SerPl-aCnc: <10 IU/mL (ref <14.0)

## 2022-12-27 ENCOUNTER — Other Ambulatory Visit (HOSPITAL_COMMUNITY): Payer: Self-pay | Admitting: Family Medicine

## 2022-12-28 ENCOUNTER — Telehealth: Payer: Self-pay | Admitting: Nurse Practitioner

## 2022-12-28 ENCOUNTER — Encounter: Payer: Self-pay | Admitting: Physical Medicine & Rehabilitation

## 2022-12-28 NOTE — Telephone Encounter (Signed)
Left message for patient to call back and schedule Medicare Annual Wellness Visit (AWV) in office.   If not able to come in office, please offer to do virtually or by telephone.  Left office number and my jabber [phone].  AWVI eligible as of 05/27/2013  Please schedule at anytime with Nurse Health Advisor.

## 2022-12-29 LAB — COMPREHENSIVE METABOLIC PANEL
ALT: 16 IU/L (ref 0–44)
AST: 21 IU/L (ref 0–40)
Albumin/Globulin Ratio: 1.2 (ref 1.2–2.2)
Albumin: 4.1 g/dL (ref 3.9–4.9)
Alkaline Phosphatase: 96 IU/L (ref 44–121)
BUN/Creatinine Ratio: 15 (ref 10–24)
BUN: 19 mg/dL (ref 8–27)
Bilirubin Total: <0.2 mg/dL (ref 0.0–1.2)
CO2: 21 mmol/L (ref 20–29)
Calcium: 9.7 mg/dL (ref 8.6–10.2)
Chloride: 100 mmol/L (ref 96–106)
Creatinine, Ser: 1.23 mg/dL (ref 0.76–1.27)
Globulin, Total: 3.5 g/dL (ref 1.5–4.5)
Glucose: 151 mg/dL — ABNORMAL HIGH (ref 70–99)
Potassium: 3.8 mmol/L (ref 3.5–5.2)
Sodium: 140 mmol/L (ref 134–144)
Total Protein: 7.6 g/dL (ref 6.0–8.5)
eGFR: 66 mL/min/{1.73_m2} (ref >59)

## 2022-12-29 LAB — CBC
Hematocrit: 43.8 % (ref 37.5–51.0)
Hemoglobin: 13.2 g/dL (ref 13.0–17.7)
MCH: 23.6 pg — ABNORMAL LOW (ref 26.6–33.0)
MCHC: 30.1 g/dL — ABNORMAL LOW (ref 31.5–35.7)
MCV: 78 fL — ABNORMAL LOW (ref 79–97)
Platelets: 291 10*3/uL (ref 150–450)
RBC: 5.59 x10E6/uL (ref 4.14–5.80)
RDW: 17.2 % — ABNORMAL HIGH (ref 11.6–15.4)
WBC: 5.3 10*3/uL (ref 3.4–10.8)

## 2022-12-29 LAB — C-REACTIVE PROTEIN: CRP: 5 mg/L (ref 0–10)

## 2022-12-29 LAB — URIC ACID: Uric Acid: 8.3 mg/dL (ref 3.8–8.4)

## 2022-12-29 LAB — SEDIMENTATION RATE: Sed Rate: 91 mm/hr — ABNORMAL HIGH (ref 0–30)

## 2023-01-02 ENCOUNTER — Telehealth (HOSPITAL_COMMUNITY): Payer: Self-pay

## 2023-01-02 ENCOUNTER — Other Ambulatory Visit (HOSPITAL_COMMUNITY): Payer: Self-pay

## 2023-01-02 NOTE — Telephone Encounter (Signed)
Patient is asking if he could be moved to [facility] rather than Ozempic I will forward to [facility] about same. He has not heard back yet about Ozempic patient assistance. Will forward to [facility].    [name], [place] 01/02/2023

## 2023-01-02 NOTE — Progress Notes (Signed)
Paramedicine Encounter    Patient ID: [name], male    DOB: [birth year], 62 y.o.   MRN: [id number]   Complaints- none   Assessment- A&Ox4, warm and dry, ambulatory, no shortness of breath, no weight gain, no swelling, no chest pain, no dizziness.   Compliance with meds- no missed doses for two weeks.   Pill box filled- for one week.   Refills needed- Lasix, Seroquel, Spironolactone   Meds changes since last visit- NONE     Social changes- NONE    BP 130/70   Pulse 76   Resp 16   Wt (!) 304 lb (137.9 kg)   SpO2 96%   BMI 39.30 kg/m  Weight yesterday-- didn't weigh Last visit weight-309lbs  Arrived for home visit for [name] who reports to be feeling well with no complaints of chest pain, shortness of breath, dizziness, swelling or weight gain.   Vitals obtained as noted.  CBG- 171   Meds reviewed and confirmed.  Pill box filled for one week.   Appointments reviewed and confirmed.   Education provided on HF and diabetic management.   Patient is asking if he could be moved to [facility] rather than Ozempic I will forward to [facility] about same. He has not heard back yet about Ozempic patient assistance.   Home visit complete. I will see [name] in one week.    [name], [place]  ACTION: Home visit completed    Patient Care Team: Fenton Foy, NP as PCP - General (Pulmonary Disease) [name], MD as PCP - Cardiology (Cardiology) [name], MD as PCP - Advanced Heart Failure (Cardiology) [name], MD as PCP - Electrophysiology (Cardiology) [name], LCSW as Social Worker (Licensed Clinical Social Worker) [name], MD as Consulting Physician (Gastroenterology)  Patient Active Problem List   Diagnosis Date Noted   Multiple joint pain 12/22/2022   Obesity (BMI 35.0-39.9 without comorbidity) 07/18/2022   Cellulitis 06/22/2022   Cellulitis, leg 06/20/2022   Presence of  Watchman left atrial appendage closure device 10/28/2021   Atrial fibrillation ([place]) 10/27/2021   Melena    Chronic diastolic CHF (congestive heart failure) ([place])    GI bleed 06/29/2021   Severe anemia 06/29/2021   Adenomatous polyp of ascending colon    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   Acute blood loss anemia    Angiodysplasia of stomach    Chronic anticoagulation    CHF exacerbation ([place]) 06/15/2020   AKI (acute kidney injury) ([place]) 06/15/2020   CKD (chronic kidney disease), stage III ([place]) 06/15/2020   Anemia due to chronic blood loss    Gastric AVM    Angiodysplasia of duodenum with hemorrhage    Chronic systolic CHF (congestive heart failure) ([place]) 04/05/2020   Anemia 04/05/2020   PAF (paroxysmal atrial fibrillation) ([place]) 04/05/2020   OSA (obstructive sleep apnea) 04/05/2020   Syncope and collapse 04/05/2020   Type 2 diabetes mellitus with hyperglycemia, with long-term current use of insulin ([place]) 12/03/2019   Diabetic polyneuropathy associated with type 2 diabetes mellitus ([place]) 12/03/2019   Erectile dysfunction 12/03/2019   Paranoid schizophrenia, chronic condition ([place]) 01/26/2015   Severe recurrent major depressive disorder with psychotic features ([place]) 01/26/2015   GAD (generalized anxiety disorder) 01/26/2015   OCD (obsessive compulsive disorder) 01/26/2015   Panic disorder without agoraphobia 01/26/2015   Insomnia 01/26/2015    Current Outpatient Medications:    acetaminophen (TYLENOL) 500 MG tablet, Take 2 tablets (1,000 mg total)  by mouth 2 (two) times daily as needed., Disp: 180 tablet, Rfl: 3   albuterol (VENTOLIN HFA) 108 (90 Base) MCG/ACT inhaler, Inhale 2 puffs into the lungs every 6 (six) hours as needed for wheezing or shortness of breath., Disp: 18 g, Rfl: 1   aspirin EC 81 MG tablet, Take 1 tablet (81 mg total) by mouth daily. Swallow whole., Disp: 90 tablet, Rfl: 3   atorvastatin (LIPITOR) 40 MG tablet, TAKE 1 TABLET BY  MOUTH ONCE DAILY NEEDS  FOLLOW  UP  APPOINTMENT  FOR  ANYMORE  REFILLS, Disp: 30 tablet, Rfl: 0   blood glucose meter kit and supplies KIT, Dispense based on patient and insurance preference. Use up to four times daily as directed. (FOR ICD-9 250.00, 250.01)., Disp: 1 each, Rfl: 0   colchicine 0.6 MG tablet, Daily for 7 days then daily PRN for gout flare, Disp: 30 tablet, Rfl: 0   Continuous Blood Gluc Receiver (FREESTYLE LIBRE 14 DAY READER) DEVI, 1 each by Does not apply route as needed., Disp: 1 each, Rfl: 11   Continuous Blood Gluc Sensor (FREESTYLE LIBRE 14 DAY SENSOR) MISC, USE 1 APPLICATOR EVERY 14 DAYS, Disp: 2 each, Rfl: 0   cyclobenzaprine (FLEXERIL) 10 MG tablet, Take 10 mg by mouth 3 (three) times daily as needed for muscle spasms., Disp: , Rfl:    diclofenac Sodium (VOLTAREN) 1 % GEL, Apply 2 g topically daily as needed for pain., Disp: , Rfl:    ferrous sulfate 325 (65 FE) MG tablet, Take 1 tablet (325 mg total) by mouth every other day. (Patient taking differently: Take 325 mg by mouth daily with breakfast.), Disp: 30 tablet, Rfl: 1   furosemide (LASIX) 40 MG tablet, TAKE 1 & 1/2 (ONE & ONE-HALF) TABLETS BY MOUTH TWICE DAILY, Disp: 90 tablet, Rfl: 0   gabapentin (NEURONTIN) 800 MG tablet, Take 1 tablet (800 mg total) by mouth every 6 (six) hours., Disp: 120 tablet, Rfl: 5   glipiZIDE (GLUCOTROL) 10 MG tablet, Take 1 tablet (10 mg total) by mouth 2 (two) times daily before a meal., Disp: 180 tablet, Rfl: 0   HUMALOG MIX 75/25 KWIKPEN (75-25) 100 UNIT/ML KwikPen, DIAL AND INJECT 30 UNITS UNDER THE SKIN TWICE DAILY, Disp: 75 mL, Rfl: 0   JARDIANCE 10 MG TABS tablet, TAKE 1 TABLET BY MOUTH ONCE DAILY . APPOINTMENT REQUIRED FOR FUTURE REFILLS, Disp: 60 tablet, Rfl: 0   LANTUS 100 UNIT/ML injection, INJECT 0.2 MLS (20 UNITS TOTAL) INTO THE SKIN DAILY, Disp: 30 mL, Rfl: 0   metFORMIN (GLUCOPHAGE) 500 MG tablet, Take 1 tablet (500 mg total) by mouth 2 (two) times daily with a meal., Disp: 60  tablet, Rfl: 2   metolazone (ZAROXOLYN) 2.5 MG tablet, Take 1 tablet (2.5 mg total) by mouth as directed., Disp: 5 tablet, Rfl: 2   metoprolol succinate (TOPROL-XL) 50 MG 24 hr tablet, Take 1 tablet (50 mg total) by mouth in the morning and at bedtime. Take with or immediately following a meal., Disp: 180 tablet, Rfl: 2   Multiple Vitamins-Minerals (CENTRUM SILVER 50+MEN) TABS, Take 1 tablet by mouth daily., Disp: , Rfl:    mupirocin ointment (BACTROBAN) 2 %, Apply 1 Application topically 2 (two) times daily., Disp: 22 g, Rfl: 0   pantoprazole (PROTONIX) 40 MG tablet, Take 1 tablet (40 mg total) by mouth daily., Disp: 90 tablet, Rfl: 1   potassium chloride SA (KLOR-CON M) 20 MEQ tablet, Take 2 tablets (40 mEq total) by mouth daily., Disp: 60 tablet,  Rfl: 6   QUEtiapine (SEROQUEL) 300 MG tablet, Take 1.5 tablets (450 mg total) by mouth at bedtime., Disp: 135 tablet, Rfl: 1   sacubitril-valsartan (ENTRESTO) 24-26 MG, Take 1 tablet by mouth 2 (two) times daily., Disp: 180 tablet, Rfl: 3   Semaglutide, 2 MG/DOSE, (OZEMPIC, 2 MG/DOSE,) 8 MG/3ML SOPN, Inject 2 mg into the skin once a week. Monday, Disp: , Rfl:    sildenafil (VIAGRA) 100 MG tablet, Take 0.5-1 tablets (50-100 mg total) by mouth daily as needed for erectile dysfunction., Disp: 5 tablet, Rfl: 11   spironolactone (ALDACTONE) 25 MG tablet, TAKE 1/2 (ONE-HALF) TABLET BY MOUTH AT BEDTIME, Disp: 45 tablet, Rfl: 0   TRUEPLUS PEN NEEDLES 31G X 6 MM MISC, USE AS DIRECTED, Disp: 100 each, Rfl: 0   zinc gluconate 50 MG tablet, Take 50 mg by mouth daily., Disp: , Rfl:    Oxycodone HCl 10 MG TABS, Take 10 mg by mouth 3 (three) times daily. (Patient not taking: Reported on 12/20/2022), Disp: , Rfl:  No Known Allergies   Social History   Socioeconomic History   Marital status: Legally Separated    Spouse name: Not on file   Number of children: Not on file   Years of education: Not on file   Highest education level: Not on file  Occupational History    Not on file  Tobacco Use   Smoking status: Never    Passive exposure: Never   Smokeless tobacco: Never  Vaping Use   Vaping Use: Never used  Substance and Sexual Activity   Alcohol use: Not Currently    Alcohol/week: 2.0 standard drinks of alcohol    Types: 2 Cans of beer per week   Drug use: No   Sexual activity: Yes    Birth control/protection: None  Other Topics Concern   Not on file  Social History Narrative   Not on file   Social Determinants of Health   Financial Resource Strain: Low Risk  (12/23/2019)   Overall Financial Resource Strain (CARDIA)    Difficulty of Paying Living Expenses: Not very hard  Food Insecurity: No Food Insecurity (07/01/2021)   Hunger Vital Sign    Worried About Running Out of Food in the Last Year: Never true    Ran Out of Food in the Last Year: Never true  Transportation Needs: Unmet Transportation Needs (07/01/2021)   PRAPARE - Hydrologist (Medical): Yes    Lack of Transportation (Non-Medical): Yes  Physical Activity: Not on file  Stress: Not on file  Social Connections: Not on file  Intimate Partner Violence: Not on file    Physical Exam      Future Appointments  Date Time Provider [place]  01/19/2023  1:20 PM Jennye Boroughs, MD CPR-PR[facility] CPR  01/29/2023  2:40 PM [name], MD MC-[facility] None  02/05/2023 10:40 AM Fenton Foy, NP SCC-SCC None  03/02/2023  7:40 AM [facility] DEVICE REMOTES [facility]  06/01/2023  7:40 AM [facility] DEVICE REMOTES [facility]

## 2023-01-03 ENCOUNTER — Other Ambulatory Visit (HOSPITAL_COMMUNITY): Payer: Self-pay | Admitting: Family Medicine

## 2023-01-03 NOTE — Telephone Encounter (Signed)
I talked with [name]. We could look into getting him changed to [facility], but there is no patient assistance program for that medication. Therefore patient would have to pay the copay which I would expect to be about $47, but more once his hits the coverage gap. Patient is unaware if he has low income subsidy or ever applied for Medicaid. He wanted to discuss with heather the next time she comes out

## 2023-01-04 NOTE — Telephone Encounter (Signed)
I have called Novo-Nordisk about 5 times to follow up on his application. I cannot get anyone on the phone and have requested a call back 5 times without a return call. The automated machine says that he isn't enrolled but I have no idea what's wrong with his application. I have re faxed the application. The success I have had are with the 1 pt who did her application online. Maybe he could do online?

## 2023-01-11 ENCOUNTER — Other Ambulatory Visit (HOSPITAL_COMMUNITY): Payer: Self-pay

## 2023-01-11 NOTE — Progress Notes (Signed)
Paramedicine Encounter    Patient ID: [name], male    DOB: 1961/08/06, 62 y.o.   MRN: [id number]   Med rec completed today for [name]. I reviewed all meds and confirmed same filling two weeks of pill boxes. Refills as noted and will be called into [facility] next week.   -Pantoprazole -Metformin -Jardiance  We reviewed upcoming appointments and I gave him number for DSS to check on LIEAP status.   Home visit complete.    [name], [place]  ACTION: Home visit completed    Patient Care Team: Fenton Foy, NP as PCP - General (Pulmonary Disease) [name], MD as PCP - Cardiology (Cardiology) [name], MD as PCP - Advanced Heart Failure (Cardiology) [name], MD as PCP - Electrophysiology (Cardiology) [name], LCSW as Social Worker (Licensed Clinical Social Worker) [name], MD as Consulting Physician (Gastroenterology)  Patient Active Problem List   Diagnosis Date Noted   Multiple joint pain 12/22/2022   Obesity (BMI 35.0-39.9 without comorbidity) 07/18/2022   Cellulitis 06/22/2022   Cellulitis, leg 06/20/2022   Presence of Watchman left atrial appendage closure device 10/28/2021   Atrial fibrillation ([place]) 10/27/2021   Melena    Chronic diastolic CHF (congestive heart failure) ([place])    GI bleed 06/29/2021   Severe anemia 06/29/2021   Adenomatous polyp of ascending colon    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   Acute blood loss anemia    Angiodysplasia of stomach    Chronic anticoagulation    CHF exacerbation ([place]) 06/15/2020   AKI (acute kidney injury) ([place]) 06/15/2020   CKD (chronic kidney disease), stage III ([place]) 06/15/2020   Anemia due to chronic blood loss    Gastric AVM    Angiodysplasia of duodenum with hemorrhage    Chronic systolic CHF (congestive heart failure) ([place]) 04/05/2020   Anemia 04/05/2020   PAF (paroxysmal atrial fibrillation) ([place])  04/05/2020   OSA (obstructive sleep apnea) 04/05/2020   Syncope and collapse 04/05/2020   Type 2 diabetes mellitus with hyperglycemia, with long-term current use of insulin ([place]) 12/03/2019   Diabetic polyneuropathy associated with type 2 diabetes mellitus ([place]) 12/03/2019   Erectile dysfunction 12/03/2019   Paranoid schizophrenia, chronic condition ([place]) 01/26/2015   Severe recurrent major depressive disorder with psychotic features ([place]) 01/26/2015   GAD (generalized anxiety disorder) 01/26/2015   OCD (obsessive compulsive disorder) 01/26/2015   Panic disorder without agoraphobia 01/26/2015   Insomnia 01/26/2015    Current Outpatient Medications:    acetaminophen (TYLENOL) 500 MG tablet, Take 2 tablets (1,000 mg total) by mouth 2 (two) times daily as needed., Disp: 180 tablet, Rfl: 3   albuterol (VENTOLIN HFA) 108 (90 Base) MCG/ACT inhaler, Inhale 2 puffs into the lungs every 6 (six) hours as needed for wheezing or shortness of breath., Disp: 18 g, Rfl: 1   aspirin EC 81 MG tablet, Take 1 tablet (81 mg total) by mouth daily. Swallow whole., Disp: 90 tablet, Rfl: 3   atorvastatin (LIPITOR) 40 MG tablet, TAKE 1 TABLET BY MOUTH ONCE DAILY NEEDS  FOLLOW  UP  APPOINTMENT  FOR  ANYMORE  REFILLS, Disp: 30 tablet, Rfl: 0   blood glucose meter kit and supplies KIT, Dispense based on patient and insurance preference. Use up to four times daily as directed. (FOR ICD-9 250.00, 250.01)., Disp: 1 each, Rfl: 0   colchicine 0.6 MG tablet, Daily for 7 days then daily PRN for gout flare, Disp: 30 tablet, Rfl: 0  Continuous Blood Gluc Receiver (FREESTYLE LIBRE 14 DAY READER) DEVI, 1 each by Does not apply route as needed., Disp: 1 each, Rfl: 11   Continuous Blood Gluc Sensor (FREESTYLE LIBRE 14 DAY SENSOR) MISC, USE 1 APPLICATOR EVERY 14 DAYS, Disp: 2 each, Rfl: 0   cyclobenzaprine (FLEXERIL) 10 MG tablet, Take 10 mg by mouth 3 (three) times daily as needed for muscle spasms., Disp: , Rfl:    diclofenac  Sodium (VOLTAREN) 1 % GEL, Apply 2 g topically daily as needed for pain., Disp: , Rfl:    ferrous sulfate 325 (65 FE) MG tablet, Take 1 tablet (325 mg total) by mouth every other day. (Patient taking differently: Take 325 mg by mouth daily with breakfast.), Disp: 30 tablet, Rfl: 1   furosemide (LASIX) 40 MG tablet, TAKE 1 & 1/2 (ONE & ONE-HALF) TABLETS BY MOUTH TWICE DAILY, Disp: 90 tablet, Rfl: 0   gabapentin (NEURONTIN) 800 MG tablet, Take 1 tablet (800 mg total) by mouth every 6 (six) hours., Disp: 120 tablet, Rfl: 5   glipiZIDE (GLUCOTROL) 10 MG tablet, Take 1 tablet (10 mg total) by mouth 2 (two) times daily before a meal., Disp: 180 tablet, Rfl: 0   HUMALOG MIX 75/25 KWIKPEN (75-25) 100 UNIT/ML KwikPen, DIAL AND INJECT 30 UNITS UNDER THE SKIN TWICE DAILY, Disp: 75 mL, Rfl: 0   JARDIANCE 10 MG TABS tablet, TAKE 1 TABLET BY MOUTH ONCE DAILY . APPOINTMENT REQUIRED FOR FUTURE REFILLS, Disp: 60 tablet, Rfl: 0   LANTUS 100 UNIT/ML injection, INJECT 0.2 MLS (20 UNITS TOTAL) INTO THE SKIN DAILY, Disp: 30 mL, Rfl: 0   metFORMIN (GLUCOPHAGE) 500 MG tablet, Take 1 tablet (500 mg total) by mouth 2 (two) times daily with a meal., Disp: 60 tablet, Rfl: 2   metoprolol succinate (TOPROL-XL) 50 MG 24 hr tablet, Take 1 tablet (50 mg total) by mouth in the morning and at bedtime. Take with or immediately following a meal., Disp: 180 tablet, Rfl: 2   Multiple Vitamins-Minerals (CENTRUM SILVER 50+MEN) TABS, Take 1 tablet by mouth daily., Disp: , Rfl:    pantoprazole (PROTONIX) 40 MG tablet, Take 1 tablet (40 mg total) by mouth daily., Disp: 90 tablet, Rfl: 1   potassium chloride SA (KLOR-CON M) 20 MEQ tablet, Take 2 tablets (40 mEq total) by mouth daily., Disp: 60 tablet, Rfl: 6   QUEtiapine (SEROQUEL) 300 MG tablet, Take 1.5 tablets (450 mg total) by mouth at bedtime., Disp: 135 tablet, Rfl: 1   sacubitril-valsartan (ENTRESTO) 24-26 MG, Take 1 tablet by mouth 2 (two) times daily., Disp: 180 tablet, Rfl: 3    Semaglutide, 2 MG/DOSE, (OZEMPIC, 2 MG/DOSE,) 8 MG/3ML SOPN, Inject 2 mg into the skin once a week. Monday, Disp: , Rfl:    sildenafil (VIAGRA) 100 MG tablet, Take 0.5-1 tablets (50-100 mg total) by mouth daily as needed for erectile dysfunction., Disp: 5 tablet, Rfl: 11   spironolactone (ALDACTONE) 25 MG tablet, TAKE 1/2 (ONE-HALF) TABLET BY MOUTH AT BEDTIME, Disp: 45 tablet, Rfl: 0   TRUEPLUS PEN NEEDLES 31G X 6 MM MISC, USE AS DIRECTED, Disp: 100 each, Rfl: 0   zinc gluconate 50 MG tablet, Take 50 mg by mouth daily., Disp: , Rfl:    metolazone (ZAROXOLYN) 2.5 MG tablet, Take 1 tablet (2.5 mg total) by mouth as directed. (Patient not taking: Reported on 01/11/2023), Disp: 5 tablet, Rfl: 2   mupirocin ointment (BACTROBAN) 2 %, Apply 1 Application topically 2 (two) times daily. (Patient not taking: Reported on 01/11/2023),  Disp: 22 g, Rfl: 0   Oxycodone HCl 10 MG TABS, Take 10 mg by mouth 3 (three) times daily. (Patient not taking: Reported on 12/20/2022), Disp: , Rfl:  No Known Allergies   Social History   Socioeconomic History   Marital status: Legally Separated    Spouse name: Not on file   Number of children: Not on file   Years of education: Not on file   Highest education level: Not on file  Occupational History   Not on file  Tobacco Use   Smoking status: Never    Passive exposure: Never   Smokeless tobacco: Never  Vaping Use   Vaping Use: Never used  Substance and Sexual Activity   Alcohol use: Not Currently    Alcohol/week: 2.0 standard drinks of alcohol    Types: 2 Cans of beer per week   Drug use: No   Sexual activity: Yes    Birth control/protection: None  Other Topics Concern   Not on file  Social History Narrative   Not on file   Social Determinants of Health   Financial Resource Strain: Low Risk  (12/23/2019)   Overall Financial Resource Strain (CARDIA)    Difficulty of Paying Living Expenses: Not very hard  Food Insecurity: No Food Insecurity (07/01/2021)    Hunger Vital Sign    Worried About Running Out of Food in the Last Year: Never true    Ran Out of Food in the Last Year: Never true  Transportation Needs: Unmet Transportation Needs (07/01/2021)   PRAPARE - Hydrologist (Medical): Yes    Lack of Transportation (Non-Medical): Yes  Physical Activity: Not on file  Stress: Not on file  Social Connections: Not on file  Intimate Partner Violence: Not on file    Physical Exam      Future Appointments  Date Time Provider [place]  01/19/2023  1:20 PM Jennye Boroughs, MD CPR-[facility] CPR  01/29/2023  2:40 PM [name], MD [facility] None  02/05/2023 10:40 AM Fenton Foy, NP SCC-SCC None  03/02/2023  7:40 AM [facility] DEVICE REMOTES [facility]  06/01/2023  7:40 AM [facility] DEVICE REMOTES [facility]

## 2023-01-15 ENCOUNTER — Encounter: Payer: Medicare Other | Admitting: Physical Medicine & Rehabilitation

## 2023-01-16 ENCOUNTER — Telehealth (HOSPITAL_COMMUNITY): Payer: Self-pay

## 2023-01-16 NOTE — Telephone Encounter (Signed)
[name] and I spoke today he reports he was denied for [place] and is struggling to pay his Gas Bill with [employer] totaling [phone]. Amount was due on 2/19- he has not received a cut off as of yet. I am fowarding to Tammy Sours to reach out to [name] to see if HF Fund can assist.  I also provided [name] DSS number for same and he advised he already tried to call them.   [name], [place] 01/16/2023

## 2023-01-17 ENCOUNTER — Other Ambulatory Visit: Payer: Self-pay | Admitting: Nurse Practitioner

## 2023-01-17 ENCOUNTER — Telehealth (HOSPITAL_COMMUNITY): Payer: Self-pay | Admitting: Licensed Clinical Social Worker

## 2023-01-17 ENCOUNTER — Other Ambulatory Visit (HOSPITAL_COMMUNITY): Payer: Self-pay | Admitting: Internal Medicine

## 2023-01-17 NOTE — Telephone Encounter (Signed)
CSW consulted by [employer] to reach out to pt regarding utility cost concerns.  CSW unable to reach pt or leave VM.  Will continue to attempt contact  [name], [facility] Desk#: [phone] Cell#: [phone]

## 2023-01-18 ENCOUNTER — Other Ambulatory Visit (HOSPITAL_COMMUNITY): Payer: Self-pay

## 2023-01-18 MED ORDER — EMPAGLIFLOZIN 10 MG PO TABS: ORAL_TABLET | ORAL | 0 refills | Status: DC

## 2023-01-19 ENCOUNTER — Encounter: Payer: Self-pay | Admitting: Physical Medicine & Rehabilitation

## 2023-01-19 ENCOUNTER — Encounter: Payer: Medicare Other | Attending: Physical Medicine & Rehabilitation | Admitting: Physical Medicine & Rehabilitation

## 2023-01-19 VITALS — BP 102/70 | HR 86 | Ht 74.0 in | Wt 304.0 lb

## 2023-01-19 DIAGNOSIS — G6289 Other specified polyneuropathies: Secondary | ICD-10-CM | POA: Diagnosis not present

## 2023-01-19 DIAGNOSIS — M2550 Pain in unspecified joint: Secondary | ICD-10-CM

## 2023-01-19 DIAGNOSIS — M1A9XX0 Chronic gout, unspecified, without tophus (tophi): Secondary | ICD-10-CM

## 2023-01-19 NOTE — Progress Notes (Signed)
Subjective:    Patient ID: [name], male    DOB: 1961-04-05, 62 y.o.   MRN: [id number]  HPI   [name] is a 62 y.o. year old male  who  has a past medical history of AICD (automatic cardioverter/defibrillator) present, Anxiety, Atrial fibrillation ([place]), CAD (coronary artery disease), Cardiomyopathy ([place]), CHF (congestive heart failure) ([place]) (09/2019), Depression, Diabetes mellitus, Erectile dysfunction (11/2019), GI bleeding, H/O right heart catheterization (09/2019), Hypertension, Presence of permanent cardiac pacemaker, Presence of Watchman left atrial appendage closure device (10/27/2021), Schizophrenia ([place]), and Sleep apnea.   They are presenting to PM&R clinic as a new patient for pain management evaluation. They were referred by Alger Simons for treatment of polyarthralgia pain.  [name] reports having pain throughout his body for more than 10 years.  Pain was previously managed at a pain doctor with [facility] and patient reports he stopped going there because of a disagreement with the doctor.  Patient was previously taking oxycodone 10 mg about 3 times a day.  Patient reports that the oxycodone has helped the pain more than any other medication.  He reports trying multiple other medications however he does not remember all the names of these.  Patient reports every joint in his body is painful.  He reports having imaging done at [facility] and reports he was told he has arthritis.  He also has a history of gout which she reports previously occurred in his ankles.  Pain is worsened by activity and cold weather.  Pain in multiple jionts 10 years or more. Pain management in past - [facility]  Xrays of lower back and neck  Gabapentin- 800 mg q6h Gout in his ankles Oxycodone- '10mg'$  helped the pain  Weather and activity worsen it       Red flag symptoms: No red flags for back pain endorsed in Hx or ROS, saddle anesthesia, loss of bowel or  bladder continence, new weakness, new numbness/tingling, and pain waking up at nighttime.  Medications tried: Topical medications no benefit Nsaids - cardiac history- cannot take  Tylenol no benefit Opiates: 10 mg helped improve his pain Gabapentin patient reports taking gabapentin 800 mg every 6 hours for neuropathy in his feet.  Diabetes Lyrica- not sure Hydrocodone- not sure   Other treatments: PT- not much help, tried to walk and go the [facility] unit - has not tried Injections - in his feet   Prior UDS results:     Component Value Date/Time   LABOPIA NONE DETECTED 06/15/2020 1414   COCAINSCRNUR NONE DETECTED 06/15/2020 1414   LABBENZ NONE DETECTED 06/15/2020 1414   AMPHETMU NONE DETECTED 06/15/2020 1414   THCU NONE DETECTED 06/15/2020 1414   LABBARB NONE DETECTED 06/15/2020 1414      Pain Inventory Average Pain 6 Pain Right Now 62 My pain is sharp and aching  In the last 24 hours, has pain interfered with the following? General activity 3 Relation with others 5 Enjoyment of life 7 What TIME of day is your pain at its worst? morning , daytime, evening, and night Sleep (in general) Poor  Pain is worse with: walking, bending, standing, and some activites Pain improves with: rest and medication Relief from Meds: 10  walk with assistance use a cane ability to climb steps?  no do you drive?  no needs help with transfers Do you have any goals in this area?  yes  not employed: date last employed 1999  bladder control problems weakness  numbness trouble walking dizziness confusion anxiety  New patient  .    Family History  Problem Relation Age of Onset   Heart failure Mother    Mental illness Sister    Mental illness Sister    Social History   Socioeconomic History   Marital status: Legally Separated    Spouse name: Not on file   Number of children: Not on file   Years of education: Not on file   Highest education level: Not on file   Occupational History   Not on file  Tobacco Use   Smoking status: Never    Passive exposure: Never   Smokeless tobacco: Never  Vaping Use   Vaping Use: Never used  Substance and Sexual Activity   Alcohol use: Not Currently    Alcohol/week: 2.0 standard drinks of alcohol    Types: 2 Cans of beer per week   Drug use: No   Sexual activity: Yes    Birth control/protection: None  Other Topics Concern   Not on file  Social History Narrative   Not on file   Social Determinants of Health   Financial Resource Strain: Low Risk  (1/63/2021)   Overall Financial Resource Strain (CARDIA)    Difficulty of Paying Living Expenses: Not very hard  Food Insecurity: No Food Insecurity (07/01/2021)   Hunger Vital Sign    Worried About Running Out of Food in the Last Year: Never true    Ran Out of Food in the Last Year: Never true  Transportation Needs: Unmet Transportation Needs (07/01/2021)   PRAPARE - Hydrologist (Medical): Yes    Lack of Transportation (Non-Medical): Yes  Physical Activity: Not on file  Stress: Not on file  Social Connections: Not on file   Past Surgical History:  Procedure Laterality Date   BIOPSY  04/19/2021   Procedure: BIOPSY;  Surgeon: Doran Stabler, MD;  Location: [place];  Service: Gastroenterology;;   COLONOSCOPY WITH PROPOFOL N/A 04/16/2021   Procedure: COLONOSCOPY WITH PROPOFOL;  Surgeon: [name], MD;  Location: [place];  Service: Gastroenterology;  Laterality: N/A;   CORONARY STENT PLACEMENT     ENTEROSCOPY N/A 04/08/2020   Procedure: ENTEROSCOPY;  Surgeon: Doran Stabler, MD;  Location: [place];  Service: Gastroenterology;  Laterality: N/A;   ENTEROSCOPY N/A 06/17/2020   Procedure: ENTEROSCOPY;  Surgeon: Irene Shipper, MD;  Location: [facility] ENDOSCOPY;  Service: Endoscopy;  Laterality: N/A;   ENTEROSCOPY N/A 04/15/2021   Procedure: ENTEROSCOPY;  Surgeon: [name], MD;  Location: [place];   Service: Gastroenterology;  Laterality: N/A;   ENTEROSCOPY N/A 04/19/2021   Procedure: ENTEROSCOPY;  Surgeon: Doran Stabler, MD;  Location: [place];  Service: Gastroenterology;  Laterality: N/A;   ENTEROSCOPY N/A 07/01/2021   Procedure: ENTEROSCOPY;  Surgeon: Jerene Bears, MD;  Location: [facility] ENDOSCOPY;  Service: Gastroenterology;  Laterality: N/A;   GIVENS CAPSULE STUDY  04/16/2021   Procedure: GIVENS CAPSULE STUDY;  Surgeon: [name], MD;  Location: [place];  Service: Gastroenterology;;   HEMOSTASIS CLIP PLACEMENT  04/08/2020   Procedure: HEMOSTASIS CLIP PLACEMENT;  Surgeon: Doran Stabler, MD;  Location: [place];  Service: Gastroenterology;;   HEMOSTASIS CLIP PLACEMENT  07/01/2021   Procedure: HEMOSTASIS CLIP PLACEMENT;  Surgeon: Jerene Bears, MD;  Location: [place];  Service: Gastroenterology;;   HEMOSTASIS CONTROL  04/08/2020   Procedure: HEMOSTASIS CONTROL;  Surgeon: Doran Stabler, MD;  Location: [place];  Service: Gastroenterology;;   HEMOSTASIS CONTROL  06/17/2020   Procedure: HEMOSTASIS CONTROL;  Surgeon: Irene Shipper, MD;  Location: [place];  Service: Endoscopy;;   HERNIA REPAIR     HOT HEMOSTASIS N/A 04/15/2021   Procedure: HOT HEMOSTASIS (ARGON PLASMA COAGULATION/BICAP);  Surgeon: [name], MD;  Location: [place];  Service: Gastroenterology;  Laterality: N/A;   HOT HEMOSTASIS N/A 07/01/2021   Procedure: HOT HEMOSTASIS (ARGON PLASMA COAGULATION/BICAP);  Surgeon: Jerene Bears, MD;  Location: [facility] ENDOSCOPY;  Service: Gastroenterology;  Laterality: N/A;   ICD IMPLANT N/A 09/03/2020   Procedure: ICD IMPLANT;  Surgeon: [name], MD;  Location: [place] CV LAB;  Service: Cardiovascular;  Laterality: N/A;   LEFT ATRIAL APPENDAGE OCCLUSION N/A 10/27/2021   Procedure: LEFT ATRIAL APPENDAGE OCCLUSION;  Surgeon: [name], MD;  Location: [place] CV LAB;  Service: Cardiovascular;  Laterality: N/A;   POLYPECTOMY   04/16/2021   Procedure: POLYPECTOMY;  Surgeon: [name], MD;  Location: [facility] ENDOSCOPY;  Service: Gastroenterology;;   RIGHT/LEFT HEART CATH AND CORONARY ANGIOGRAPHY N/A 10/14/2019   Procedure: RIGHT/LEFT HEART CATH AND CORONARY ANGIOGRAPHY;  Surgeon: Belva Crome, MD;  Location: [place] CV LAB;  Service: Cardiovascular;  Laterality: N/A;   SUBMUCOSAL TATTOO INJECTION  04/19/2021   Procedure: SUBMUCOSAL TATTOO INJECTION;  Surgeon: Doran Stabler, MD;  Location: [place];  Service: Gastroenterology;;   TEE WITHOUT CARDIOVERSION N/A 10/27/2021   Procedure: TRANSESOPHAGEAL ECHOCARDIOGRAM (TEE);  Surgeon: [name], MD;  Location: [place] CV LAB;  Service: Cardiovascular;  Laterality: N/A;   TEE WITHOUT CARDIOVERSION N/A 12/12/2021   Procedure: TRANSESOPHAGEAL ECHOCARDIOGRAM (TEE);  Surgeon: Geralynn Rile, MD;  Location: [place];  Service: Cardiovascular;  Laterality: N/A;   Past Medical History:  Diagnosis Date   AICD (automatic cardioverter/defibrillator) present    Anxiety    Atrial fibrillation ([place])    CAD (coronary artery disease)    Cardiomyopathy ([place])    CHF (congestive heart failure) ([place]) 09/2019   Depression    Diabetes mellitus    Erectile dysfunction 11/2019   GI bleeding    H/O right heart catheterization 09/2019   Hypertension    Presence of permanent cardiac pacemaker    Presence of Watchman left atrial appendage closure device 10/27/2021   27 mm Watchman Flex Device per Dr. Quentin Ore   Schizophrenia [facility])    Sleep apnea    uses cpap   There were no vitals taken for this visit.  Opioid Risk Score:   Fall Risk Score:  `1  Depression screen [facility] 2/9     02/02/2022   11:28 AM 11/04/2021   11:12 AM 09/30/2021    1:35 PM 08/03/2021    1:55 PM 04/01/2021    1:16 PM 01/19/2021    8:54 AM 09/13/2020    9:01 AM  Depression screen PHQ 2/9  Decreased Interest 0 0 0 1 0 1 0  Down, Depressed, Hopeless 0 0 0 '1 1 1 '$ 0  PHQ - 2 Score 0 0 0  '2 1 2 '$ 0  Altered sleeping    1  0   Tired, decreased energy    1  1   Change in appetite    0  0   Feeling bad or failure about yourself     1  1   Trouble concentrating    1  0   Moving slowly or fidgety/restless    1  0   Suicidal thoughts  0  0   PHQ-9 Score    7  4   Difficult doing work/chores    Not difficult at all  Somewhat difficult     Review of Systems  Musculoskeletal:  Positive for arthralgias and back pain.       B/L Leg, hands       Objective:   Physical Exam   Gen: no distress, normal appearing HEENT: oral mucosa pink and moist, NCAT Cardio: Reg rate Chest: normal effort, normal rate of breathing Abd: soft, non-distended Ext: no edema Psych: A little bit flat, overall pleasant Skin: intact Neuro: Alert and oriented, follows commands, cranial nerves II through XII grossly intact Strength 5- out of 5 in all 4 extremities appears very pain limited Sensation intact light touch in all 4 extremities No ankle clonus No ataxia or dysmetria noted Musculoskeletal:  Patient has widespread tenderness to palpation throughout his paraspinal muscles in the cervical, thoracic, lumbar regions, he has tenderness throughout his periscapular muscles, shoulders, elbows, wrist, hips, knees, ankles SLR and FABER resulted in pain in his back and knees Spurling's resulted in pain in his neck Facet loading resulted in pain throughout his back and shoulders No acutely inflamed joints noted today Patient appears to have significant difficulty getting up out of a chair due to his pain   06/21/23 CT foot ankle tibia-fibula IMPRESSION: 1. Superficial edema without localizing abscess or underlying destructive bone lesion. 2. No visible significant edema in the deep soft tissues or appreciable fascial thickening. There is no soft tissue gas. Lack of overt findings of necrotizing fasciitis on CT however, should not delay surgical exploration if there is strong clinical evidence  of the disease. 3. Mild fatty atrophy in the gastrocnemius, with fatty atrophy in the intrinsic plantar foot musculature. 4. Degenerative and enthesopathic changes. 5. Calcifications in the popliteal trifurcation arteries    Assessment & Plan:    Polyarthralgia -[name] appears to have diffuse tenderness throughout his body, will attempt to get records from his prior pain clinic -Did not have significant imaging available however lower extremity CT appears to show arthritis of his knee, enthesopathic changes, ankle and foot arthritic changes -Patient reports trying multiple medications and oxycodone and gabapentin at the only medications that have helped and thus far, would like additional information before considering treatment options -Presentation concerning for underlying rheumatological disease, will place rheumatology consult for further evaluation   Gout -Patient uses colchicine for gout flares  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

## 2023-01-23 ENCOUNTER — Telehealth (HOSPITAL_COMMUNITY): Payer: Self-pay

## 2023-01-23 NOTE — Telephone Encounter (Signed)
Attempted to reach [name] to schedule home visit with no answer and unable to leave a VM. I will continue to reach out.   [name], [place] 01/23/2023

## 2023-01-26 ENCOUNTER — Telehealth (HOSPITAL_COMMUNITY): Payer: Self-pay | Admitting: Licensed Clinical Social Worker

## 2023-01-26 NOTE — Telephone Encounter (Signed)
CSW attempted to call pt to discuss current utility concerns.  Unable to reach- left VM requesting return call.  CSW looked up utility bill through [employer] pay and saw he owes $71 not due until 3/18 so apparently not behind on his bill at this time or at risk of turn off  [name], [place] Social Worker [facility] Desk#: [phone] Cell#: [phone]

## 2023-01-29 ENCOUNTER — Other Ambulatory Visit (HOSPITAL_COMMUNITY): Payer: Self-pay | Admitting: Family Medicine

## 2023-01-29 ENCOUNTER — Ambulatory Visit (HOSPITAL_COMMUNITY)
Admission: RE | Admit: 2023-01-29 | Discharge: 2023-01-29 | Disposition: A | Discharge status: Home / Self Care | Payer: Medicare Other | Source: Ambulatory Visit | Attending: Cardiology | Admitting: Cardiology

## 2023-01-29 ENCOUNTER — Encounter (HOSPITAL_COMMUNITY): Payer: Self-pay | Admitting: Cardiology

## 2023-01-29 ENCOUNTER — Other Ambulatory Visit: Payer: Self-pay | Admitting: Nurse Practitioner

## 2023-01-29 ENCOUNTER — Other Ambulatory Visit (HOSPITAL_COMMUNITY): Payer: Self-pay

## 2023-01-29 VITALS — BP 136/90 | HR 73 | Wt 301.8 lb

## 2023-01-29 DIAGNOSIS — G4733 Obstructive sleep apnea (adult) (pediatric): Secondary | ICD-10-CM | POA: Insufficient documentation

## 2023-01-29 DIAGNOSIS — I5022 Chronic systolic (congestive) heart failure: Secondary | ICD-10-CM | POA: Diagnosis not present

## 2023-01-29 DIAGNOSIS — Z7984 Long term (current) use of oral hypoglycemic drugs: Secondary | ICD-10-CM | POA: Insufficient documentation

## 2023-01-29 DIAGNOSIS — I130 Hypertensive heart and chronic kidney disease with heart failure and stage 1 through stage 4 chronic kidney disease, or unspecified chronic kidney disease: Secondary | ICD-10-CM | POA: Insufficient documentation

## 2023-01-29 DIAGNOSIS — I480 Paroxysmal atrial fibrillation: Secondary | ICD-10-CM | POA: Diagnosis not present

## 2023-01-29 DIAGNOSIS — N1831 Chronic kidney disease, stage 3a: Secondary | ICD-10-CM | POA: Diagnosis not present

## 2023-01-29 DIAGNOSIS — F209 Schizophrenia, unspecified: Secondary | ICD-10-CM | POA: Insufficient documentation

## 2023-01-29 DIAGNOSIS — I5032 Chronic diastolic (congestive) heart failure: Secondary | ICD-10-CM | POA: Diagnosis not present

## 2023-01-29 DIAGNOSIS — E669 Obesity, unspecified: Secondary | ICD-10-CM | POA: Diagnosis not present

## 2023-01-29 DIAGNOSIS — Z6838 Body mass index (BMI) 38.0-38.9, adult: Secondary | ICD-10-CM | POA: Insufficient documentation

## 2023-01-29 DIAGNOSIS — Z79899 Other long term (current) drug therapy: Secondary | ICD-10-CM | POA: Insufficient documentation

## 2023-01-29 DIAGNOSIS — Z955 Presence of coronary angioplasty implant and graft: Secondary | ICD-10-CM | POA: Insufficient documentation

## 2023-01-29 DIAGNOSIS — E1122 Type 2 diabetes mellitus with diabetic chronic kidney disease: Secondary | ICD-10-CM | POA: Insufficient documentation

## 2023-01-29 DIAGNOSIS — I2510 Atherosclerotic heart disease of native coronary artery without angina pectoris: Secondary | ICD-10-CM | POA: Insufficient documentation

## 2023-01-29 LAB — BASIC METABOLIC PANEL
Anion gap: 12 (ref 5–15)
BUN: 20 mg/dL (ref 8–23)
CO2: 26 mmol/L (ref 22–32)
Calcium: 9.2 mg/dL (ref 8.9–10.3)
Chloride: 100 mmol/L (ref 98–111)
Creatinine, Ser: 1.22 mg/dL (ref 0.61–1.24)
GFR, Estimated: >60 mL/min (ref >60)
Glucose, Bld: 65 mg/dL — ABNORMAL LOW (ref 70–99)
Potassium: 3.9 mmol/L (ref 3.5–5.1)
Sodium: 138 mmol/L (ref 135–145)

## 2023-01-29 LAB — CBC
HCT: 47.6 % (ref 39.0–52.0)
Hemoglobin: 13.7 g/dL (ref 13.0–17.0)
MCH: 23.7 pg — ABNORMAL LOW (ref 26.0–34.0)
MCHC: 28.8 g/dL — ABNORMAL LOW (ref 30.0–36.0)
MCV: 82.5 fL (ref 80.0–100.0)
Platelets: 311 10*3/uL (ref 150–400)
RBC: 5.77 MIL/uL (ref 4.22–5.81)
RDW: 15 % (ref 11.5–15.5)
WBC: 6.5 10*3/uL (ref 4.0–10.5)
nRBC: 0 % (ref 0.0–0.2)

## 2023-01-29 LAB — LIPID PANEL
Cholesterol: 114 mg/dL (ref 0–200)
HDL: 34 mg/dL — ABNORMAL LOW (ref >40)
LDL Cholesterol: 58 mg/dL (ref 0–99)
Total CHOL/HDL Ratio: 3.4 RATIO
Triglycerides: 111 mg/dL (ref <150)
VLDL: 22 mg/dL (ref 0–40)

## 2023-01-29 LAB — BRAIN NATRIURETIC PEPTIDE: B Natriuretic Peptide: 53.1 pg/mL (ref 0.0–100.0)

## 2023-01-29 MED ORDER — SPIRONOLACTONE 25 MG PO TABS: 25.0000 mg | ORAL_TABLET | Freq: Every day | ORAL | 3 refills | Status: DC

## 2023-01-29 NOTE — Progress Notes (Signed)
H&V Care Navigation CSW Progress Note  Clinical Social Worker consulted by [employer] to help with current overdue [employer].  Pt brought bill to clinic appt and has turn off notice if $382.01 not paid by today.  CSW able to assist through patient care fund- pt will be responsbility for the remainder of the bill and states he can manage this.   SDOH Screenings   Food Insecurity: No Food Insecurity (07/01/2021)  Housing: Low Risk  (07/01/2021)  Transportation Needs: Unmet Transportation Needs (07/01/2021)  Utilities: At Risk (01/29/2023)  Depression (PHQ2-9): High Risk (01/19/2023)  Financial Resource Strain: Low Risk  (12/23/2019)  Tobacco Use: Low Risk  (01/29/2023)   [name], LCSW Clinical Social Worker [facility] Desk#: [phone] Cell#: [phone]

## 2023-01-29 NOTE — Progress Notes (Signed)
Paramedicine Encounter    Patient ID: [name], male    DOB: 1961/10/18, 62 y.o.   MRN: [id number]   Arrived for home visit for [name] today who was scheduled for home visit last week but failed to communicate with paramedicine so visit was rescheduled for today. He reports having some flu like symptoms for a week now with coughing, mucus production and sinus pressure and congestion. He denied shortness of breath, chest pain or dizziness. He reports taking OTC meds for same with some improvements. Vitals obtained and as noted. No lower leg edema noted, lungs slightly diminished lower lobes, weight stable. Meds reviewed-he reports he has been taking them out of the bottles over the last week and feels he's been taking them wrong. I reviewed and filled pill box for one week. Multiple refills needed- I will call in same to [facility]. We reviewed upcoming appointments- he will see Dr. Aundra Dubin today, I will come out to see him again in the home this week if changes are made. He agreed with plan. Home visit complete.   WT- 303.8lbs BP- 138/72 (no meds yet) HR- 74 O2- 87% RR- 16  CBG- 328 ( no insulin yet)   Refills: Atorvastatin Pantoprazole  Jardiance Seroquel  Potassium  Lasix Pen Needles Metformin Freestyle Sensors   [name], [place] 01/29/2023       Patient Care Team: Fenton Foy, NP as PCP - General (Pulmonary Disease) [name], MD as PCP - Cardiology (Cardiology) [name], MD as PCP - Advanced Heart Failure (Cardiology) [name], MD as PCP - Electrophysiology (Cardiology) [name], LCSW as Social Worker (Licensed Clinical Social Worker) [name], MD as Consulting Physician (Gastroenterology)  Patient Active Problem List   Diagnosis Date Noted   Multiple joint pain 12/22/2022   Obesity (BMI 35.0-39.9 without comorbidity) 07/18/2022   Cellulitis 06/22/2022   Cellulitis, leg 06/20/2022   Presence  of Watchman left atrial appendage closure device 10/28/2021   Atrial fibrillation ([place]) 10/27/2021   [place]    Chronic diastolic CHF (congestive heart failure) ([place])    GI bleed 06/29/2021   Severe anemia 06/29/2021   Adenomatous polyp of ascending colon    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   Acute blood loss anemia    Angiodysplasia of stomach    Chronic anticoagulation    CHF exacerbation ([place]) 06/15/2020   AKI (acute kidney injury) ([place]) 06/15/2020   CKD (chronic kidney disease), stage III ([place]) 06/15/2020   Anemia due to chronic blood loss    Gastric AVM    Angiodysplasia of duodenum with hemorrhage    Chronic systolic CHF (congestive heart failure) ([place]) 04/05/2020   Anemia 04/05/2020   PAF (paroxysmal atrial fibrillation) ([place]) 04/05/2020   OSA (obstructive sleep apnea) 04/05/2020   Syncope and collapse 04/05/2020   Type 2 diabetes mellitus with hyperglycemia, with long-term current use of insulin ([place]) 12/03/2019   Diabetic polyneuropathy associated with type 2 diabetes mellitus ([place]) 12/03/2019   Erectile dysfunction 12/03/2019   Paranoid schizophrenia, chronic condition ([place]) 01/26/2015   Severe recurrent major depressive disorder with psychotic features ([place]) 01/26/2015   GAD (generalized anxiety disorder) 01/26/2015   OCD (obsessive compulsive disorder) 01/26/2015   Panic disorder without agoraphobia 01/26/2015   Insomnia 01/26/2015    Current Outpatient Medications:    acetaminophen (TYLENOL) 500 MG tablet, Take 2 tablets (1,000 mg total) by mouth 2 (two) times daily as needed., Disp: 180 tablet, Rfl: 3   albuterol (VENTOLIN  HFA) 108 (90 Base) MCG/ACT inhaler, Inhale 2 puffs into the lungs every 6 (six) hours as needed for wheezing or shortness of breath., Disp: 18 g, Rfl: 1   aspirin EC 81 MG tablet, Take 1 tablet (81 mg total) by mouth daily. Swallow whole., Disp: 90 tablet, Rfl: 3   atorvastatin (LIPITOR) 40 MG tablet, TAKE 1 TABLET  BY MOUTH ONCE DAILY NEEDS  FOLLOW  UP  APPOINTMENT  FOR  ANYMORE  REFILLS, Disp: 30 tablet, Rfl: 0   blood glucose meter kit and supplies KIT, Dispense based on patient and insurance preference. Use up to four times daily as directed. (FOR ICD-9 250.00, 250.01)., Disp: 1 each, Rfl: 0   colchicine 0.6 MG tablet, Daily for 7 days then daily PRN for gout flare, Disp: 30 tablet, Rfl: 0   Continuous Blood Gluc Receiver (FREESTYLE LIBRE 14 DAY READER) DEVI, 1 each by Does not apply route as needed., Disp: 1 each, Rfl: 11   Continuous Blood Gluc Sensor (FREESTYLE LIBRE 14 DAY SENSOR) MISC, USE 1 APPLICATOR EVERY 14 DAYS, Disp: 2 each, Rfl: 0   cyclobenzaprine (FLEXERIL) 10 MG tablet, Take 10 mg by mouth 3 (three) times daily as needed for muscle spasms., Disp: , Rfl:    diclofenac Sodium (VOLTAREN) 1 % GEL, Apply 2 g topically daily as needed for pain., Disp: , Rfl:    empagliflozin (JARDIANCE) 10 MG TABS tablet, TAKE 1 TABLET BY MOUTH ONCE DAILY . APPOINTMENT REQUIRED FOR FUTURE REFILLS, Disp: 60 tablet, Rfl: 0   ferrous sulfate 325 (65 FE) MG tablet, Take 1 tablet (325 mg total) by mouth every other day. (Patient taking differently: Take 325 mg by mouth daily with breakfast.), Disp: 30 tablet, Rfl: 1   furosemide (LASIX) 40 MG tablet, TAKE 1 & 1/2 (ONE & ONE-HALF) TABLETS BY MOUTH TWICE DAILY, Disp: 90 tablet, Rfl: 0   gabapentin (NEURONTIN) 800 MG tablet, Take 1 tablet (800 mg total) by mouth every 6 (six) hours., Disp: 120 tablet, Rfl: 5   glipiZIDE (GLUCOTROL) 10 MG tablet, Take 1 tablet (10 mg total) by mouth 2 (two) times daily before a meal., Disp: 180 tablet, Rfl: 0   HUMALOG MIX 75/25 KWIKPEN (75-25) 100 UNIT/ML KwikPen, DIAL AND INJECT 30 UNITS UNDER THE SKIN TWICE DAILY, Disp: 75 mL, Rfl: 0   LANTUS 100 UNIT/ML injection, INJECT 0.2 MLS (20 UNITS TOTAL) INTO THE SKIN DAILY, Disp: 30 mL, Rfl: 0   metFORMIN (GLUCOPHAGE) 500 MG tablet, Take 1 tablet (500 mg total) by mouth 2 (two) times daily with a  meal., Disp: 60 tablet, Rfl: 2   metolazone (ZAROXOLYN) 2.5 MG tablet, Take 1 tablet (2.5 mg total) by mouth as directed., Disp: 5 tablet, Rfl: 2   metoprolol succinate (TOPROL-XL) 50 MG 24 hr tablet, Take 1 tablet (50 mg total) by mouth in the morning and at bedtime. Take with or immediately following a meal., Disp: 180 tablet, Rfl: 2   Multiple Vitamins-Minerals (CENTRUM SILVER 50+MEN) TABS, Take 1 tablet by mouth daily., Disp: , Rfl:    mupirocin ointment (BACTROBAN) 2 %, Apply 1 Application topically 2 (two) times daily., Disp: 22 g, Rfl: 0   Oxycodone HCl 10 MG TABS, Take 10 mg by mouth 3 (three) times daily., Disp: , Rfl:    pantoprazole (PROTONIX) 40 MG tablet, Take 1 tablet by mouth once daily, Disp: 90 tablet, Rfl: 0   potassium chloride SA (KLOR-CON M) 20 MEQ tablet, Take 2 tablets (40 mEq total) by mouth daily., Disp:  60 tablet, Rfl: 6   QUEtiapine (SEROQUEL) 300 MG tablet, Take 1.5 tablets (450 mg total) by mouth at bedtime., Disp: 135 tablet, Rfl: 1   sacubitril-valsartan (ENTRESTO) 24-26 MG, Take 1 tablet by mouth 2 (two) times daily., Disp: 180 tablet, Rfl: 3   Semaglutide, 2 MG/DOSE, (OZEMPIC, 2 MG/DOSE,) 8 MG/3ML SOPN, Inject 2 mg into the skin once a week. Monday, Disp: , Rfl:    sildenafil (VIAGRA) 100 MG tablet, Take 0.5-1 tablets (50-100 mg total) by mouth daily as needed for erectile dysfunction., Disp: 5 tablet, Rfl: 11   spironolactone (ALDACTONE) 25 MG tablet, TAKE 1/2 (ONE-HALF) TABLET BY MOUTH AT BEDTIME, Disp: 45 tablet, Rfl: 0   TRUEPLUS PEN NEEDLES 31G X 6 MM MISC, USE AS DIRECTED, Disp: 100 each, Rfl: 0   zinc gluconate 50 MG tablet, Take 50 mg by mouth daily., Disp: , Rfl:  No Known Allergies   Social History   Socioeconomic History   Marital status: Legally Separated    Spouse name: Not on file   Number of children: Not on file   Years of education: Not on file   Highest education level: Not on file  Occupational History   Not on file  Tobacco Use    Smoking status: Never    Passive exposure: Never   Smokeless tobacco: Never  Vaping Use   Vaping Use: Never used  Substance and Sexual Activity   Alcohol use: Not Currently    Alcohol/week: 2.0 standard drinks of alcohol    Types: 2 Cans of beer per week   Drug use: No   Sexual activity: Yes    Birth control/protection: None  Other Topics Concern   Not on file  Social History Narrative   Not on file   Social Determinants of Health   Financial Resource Strain: Low Risk  (12/23/2019)   Overall Financial Resource Strain (CARDIA)    Difficulty of Paying Living Expenses: Not very hard  Food Insecurity: No Food Insecurity (07/01/2021)   Hunger Vital Sign    Worried About Running Out of Food in the Last Year: Never true    Ran Out of Food in the Last Year: Never true  Transportation Needs: Unmet Transportation Needs (07/01/2021)   PRAPARE - Hydrologist (Medical): Yes    Lack of Transportation (Non-Medical): Yes  Physical Activity: Not on file  Stress: Not on file  Social Connections: Not on file  Intimate Partner Violence: Not on file    Physical Exam      Future Appointments  Date Time Provider [place]  01/29/2023  2:40 PM [name], MD [facility] None  02/05/2023 10:40 AM Fenton Foy, NP [place] None  02/26/2023  2:40 PM Jennye Boroughs, MD [facility] CPR  03/02/2023  7:40 AM [facility] DEVICE REMOTES [facility]  05/08/2023  8:00 AM Benjamine Mola, Resa Miner, MD [facility] None  06/01/2023  7:40 AM [facility] DEVICE REMOTES [id number]     ACTION: Home visit completed

## 2023-01-29 NOTE — Patient Instructions (Signed)
INCREASE Spironolactone to 25 mg daily.  Labs done today, your results will be available in MyChart, we will contact you for abnormal readings.  Repeat blood work in 10 days.  Your physician has requested that you have an echocardiogram. Echocardiography is a painless test that uses sound waves to create images of your heart. It provides your doctor with information about the size and shape of your heart and how well your heart's chambers and valves are working. This procedure takes approximately one hour. There are no restrictions for this procedure. Please do NOT wear cologne, perfume, aftershave, or lotions (deodorant is allowed). Please arrive 15 minutes prior to your appointment time.  Your physician recommends that you schedule a follow-up appointment in: 4 months (July ) ** please call the office in May to arrange your follow up appointment. **  If you have any questions or concerns before your next appointment please send [place] a message through [place] or call our office at [phone].    TO LEAVE A MESSAGE FOR THE NURSE SELECT OPTION 2, PLEASE LEAVE A MESSAGE INCLUDING: YOUR NAME DATE OF BIRTH CALL BACK NUMBER REASON FOR CALL**this is important as we prioritize the call backs  YOU WILL RECEIVE A CALL BACK THE SAME DAY AS LONG AS YOU CALL BEFORE 4:00 PM  At the [facility], you and your health needs are our priority. As part of our continuing mission to provide you with exceptional heart care, we have created designated Provider Care Teams. These Care Teams include your primary Cardiologist (physician) and Advanced Practice Providers (APPs- Physician Assistants and Nurse Practitioners) who all work together to provide you with the care you need, when you need it.   You may see any of the following providers on your designated Care Team at your next follow up: Dr Glori Bickers Dr Loralie Champagne Dr. Roxana Hires, NP Lyda Jester, [place] [facility] [place], [place] Forestine Na, NP [name], PharmD   Please be sure to bring in all your medications bottles to every appointment.    Thank you for choosing [facility]

## 2023-01-30 ENCOUNTER — Telehealth: Payer: Self-pay | Admitting: Pharmacist

## 2023-01-30 ENCOUNTER — Telehealth: Payer: Self-pay | Admitting: *Deleted

## 2023-01-30 ENCOUNTER — Other Ambulatory Visit (HOSPITAL_COMMUNITY): Payer: Self-pay

## 2023-01-30 MED ORDER — FUROSEMIDE 40 MG PO TABS: 60.0000 mg | ORAL_TABLET | Freq: Two times a day (BID) | ORAL | 11 refills | Status: DC

## 2023-01-30 MED ORDER — EMPAGLIFLOZIN 10 MG PO TABS: ORAL_TABLET | ORAL | 0 refills | Status: DC

## 2023-01-30 MED ORDER — ATORVASTATIN CALCIUM 40 MG PO TABS: 40.0000 mg | ORAL_TABLET | Freq: Every day | ORAL | 3 refills | Status: DC

## 2023-01-30 NOTE — Telephone Encounter (Signed)
Received notification from NovoNordisk pt ass that date is missing from Rx. Resent with date.

## 2023-01-30 NOTE — Progress Notes (Signed)
ADVANCED HEART FAILURE CLINIC NOTE  PCP: Fenton Foy, NP Cardiology: Dr. Radford Pax HF Cardiology: Dr. Aundra Dubin  62 y.o. with history of chronic systolic CHF, CAD, type 2 diabetes, paroxysmal atrial fibrillation, and schizophrenia was referred by Dr. Radford Pax for evaluation of CHF.  Patient had OM2 PCI in 2012.  In 11/20, he was admitted with CHF. Echo showed EF 25-30% with diffuse hypokinesis.  LHC was done, showing occluded OM2 at prior stent, 90% D1 stenosis, and extensive diffuse RCA disease.  No intervention.  He was thought to be in paroxysmal atrial fibrillation during this appointment and apixaban was started.   He does not smoke, rarely drinks, and does not use drugs.  His mother had "heart problems."    He can write his name only. He is only able to read a few words. Thinks he completed the 7th grade.    Echo in 5/21 showed EF 30% with diffuse hypokinesis, mild LVH, PASP 38, mildly decreased RV systolic function, IVC dilated. Biotronik ICD placed.   He was hospitalized in 7/21 with upper GI bleeding and CHF exacerbation.  EGD showed duodenal AVMs, treated with APC. He was diuresed.   Clinic visit 5/22 he was volume overloaded, weight up 19 lbs, and diuretics increased and Farxiga started.   He was admitted to [facility] a couple weeks later (04/13/21) for a/c CHF. He was diuresed with IV lasix. Hospitalization complicated by GIB & AKI. Enteroscopy performed on 5/20 which showed 2 small angioectasias with no bleeding in the third portion of the duodenum. Colonoscopy on 5/21, showed multiple polyps which were biopsied. No source of bleeding found. He had pill endoscopy 5/22 which showed non bleeding AVM. Pt had repeat enteroscopy which showed non bleeding jejunal ulcer. Eliquis held during admission but restarted at discharge. Home hydralazine, spiro, Entresto, and Imdur stopped in setting of GIB; carvedilol dose decreased at discharge.  He returned 6/22 for post hospitalization HF follow up. Up 10  lbs per paramedicine. NYHA II symptoms. Entresto restarted and lasix increased.  Admitted 7/22 for chest pain, found to have hgb of 4.7. He received 2 U PRBCs, Eliquis was held and GI & AHF were consulted for further management. Farxiga and spiro held with increase in kidney function. Repeat echo on 06/30/21 EF 50-55%, mildly dilated LV, mild LVH, RV okay, dilated IVC with estimated RA pressure of 8. He underwent EGD on 07/01/21 showing three non-bleeding angioectasias in the duodenum. Treated with argon plasma coagulation (APC). GDMT therapy added back as SCr improved. Eliquis restarted and arrangement made for referral for Watchman device consideration. Day of discharge weight 324 lbs, SCr 1.29 and hgb 8.3.   S/p Watchman 10/27/21.   Device showed ATP (07/27/22) >>inappropriate shock for rapid AF/AFL, did convert to NSR. His Coreg was changed to Toprol at EP follow up.    Today he returns for HF follow up.  Not smoking or drinking.  Weight down 9 lbs.  Using Ozempic but has been out for about 2 wks.  Mild dyspnea walking up stairs, does fine walking on flat ground.  No chest pain.  No orthopnea/PND.  Using CPAP.   ECG (personally reviewed): NSR with PACs.  Labs (1/21): LDL 66, HDL 42, hgb 11.3, K 4.7, creatinine 1.32 Labs (4/21): K 5, creatinine 1.37 Labs (7/21): K 4.1, creatinine 1.26, hgb 9.3 Labs (8/21): LDL 51, K 3.8, creatinine 1.3 Labs (11/21): K 4, creatinine 1.42, hgb 13.1, hgbA1c 11.7 Labs (2/22): HgbA1c 7.7 Labs (3/22): K 4.4, creatinine 1.27, pro-BNP  249. Labs (5/22): K 4.4, creatinine 1.22 Labs (8/22): K 4.1, creatinine 1.00, hgb 8.3 Labs (12/22): K 4.7, creatinine 1.13 Labs (10/23): K 4.4, creatinine 1.12 Labs (1/24): K 3.8, creatinine 1.23, LFTs normal  PMH:  1. Atrial fibrillation: Paroxysmal - s/p LAAO device (12/22) 2. Type 2 diabetes 3. HTN 4. Hyperlipidemia 5. Schizophrenia 6. CAD: PCI OM2 in 2012.  - LHC (11/20): 90% D1 stenosis, totally occluded OM2 at stent, serial  85%/70%/60% RCA stenoses.  7. 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  Biotronik ICD.  - Echo (11/20): EF 25-30%, global hypokinesis.  - Echo (5/21): EF 30% with diffuse hypokinesis, mild LVH, PASP 38, mildly decreased RV systolic function, IVC dilated. - TEE (1/23): EF 50-55%, global LV HK, normal RV 8. Upper GI bleeding: 7/21, duodenal AVMs treated with APC.  9. OSA: Does not use CPAP regularly.   Social History   Socioeconomic History   Marital status: Legally Separated    Spouse name: Not on file   Number of children: Not on file   Years of education: Not on file   Highest education level: Not on file  Occupational History   Not on file  Tobacco Use   Smoking status: Never    Passive exposure: Never   Smokeless tobacco: Never  Vaping Use   Vaping Use: Never used  Substance and Sexual Activity   Alcohol use: Not Currently    Alcohol/week: 2.0 standard drinks of alcohol    Types: 2 Cans of beer per week   Drug use: No   Sexual activity: Yes    Birth control/protection: None  Other Topics Concern   Not on file  Social History Narrative   Not on file   Social Determinants of Health   Financial Resource Strain: Low Risk  (12/23/2019)   Overall Financial Resource Strain (CARDIA)    Difficulty of Paying Living Expenses: Not very hard  Food Insecurity: No Food Insecurity (07/01/2021)   Hunger Vital Sign    Worried About Running Out of Food in the Last Year: Never true    Ran Out of Food in the Last Year: Never true  Transportation Needs: Unmet Transportation Needs (07/01/2021)   PRAPARE - Hydrologist (Medical): Yes    Lack of Transportation (Non-Medical): Yes  Physical Activity: Not on file  Stress: Not on file  Social Connections: Not on file  Intimate Partner Violence: Not on file   Family History  Problem Relation Age of Onset   Heart failure Mother    Mental illness Sister    Mental illness Sister     ROS: All systems reviewed and negative except as per HPI.  Current Meds  Medication Sig   acetaminophen (TYLENOL) 500 MG tablet Take 2 tablets (1,000 mg total) by mouth 2 (two) times daily as needed.   albuterol (VENTOLIN HFA) 108 (90 Base) MCG/ACT inhaler Inhale 2 puffs into the lungs every 6 (six) hours as needed for wheezing or shortness of breath.   aspirin EC 81 MG tablet Take 1 tablet (81 mg total) by mouth daily. Swallow whole.   atorvastatin (LIPITOR) 40 MG tablet TAKE 1 TABLET BY MOUTH ONCE DAILY NEEDS  FOLLOW  UP  APPOINTMENT  FOR  ANYMORE  REFILLS   blood glucose meter kit and supplies KIT Dispense based on patient and insurance preference. Use up to four times daily as directed. (FOR ICD-9 250.00, 250.01).   colchicine 0.6 MG tablet Daily for 7 days then daily  PRN for gout flare   Continuous Blood Gluc Receiver (FREESTYLE LIBRE 14 DAY READER) DEVI 1 each by Does not apply route as needed.   Continuous Blood Gluc Sensor (FREESTYLE LIBRE 14 DAY SENSOR) MISC USE 1 APPLICATOR EVERY 14 DAYS   cyclobenzaprine (FLEXERIL) 10 MG tablet Take 10 mg by mouth 3 (three) times daily as needed for muscle spasms.   diclofenac Sodium (VOLTAREN) 1 % GEL Apply 2 g topically daily as needed for pain.   empagliflozin (JARDIANCE) 10 MG TABS tablet TAKE 1 TABLET BY MOUTH ONCE DAILY . APPOINTMENT REQUIRED FOR FUTURE REFILLS   ferrous sulfate 325 (65 FE) MG tablet Take 1 tablet (325 mg total) by mouth every other day. (Patient taking differently: Take 325 mg by mouth daily with breakfast.)   furosemide (LASIX) 40 MG tablet TAKE 1 & 1/2 (ONE & ONE-HALF) TABLETS BY MOUTH TWICE DAILY   gabapentin (NEURONTIN) 800 MG tablet Take 1 tablet (800 mg total) by mouth every 6 (six) hours.   glipiZIDE (GLUCOTROL) 10 MG tablet Take 1 tablet (10 mg total) by mouth 2 (two) times daily before a meal.   HUMALOG MIX 75/25 KWIKPEN (75-25) 100 UNIT/ML KwikPen DIAL AND INJECT 30 UNITS UNDER THE SKIN TWICE DAILY   LANTUS 100  UNIT/ML injection INJECT 0.2 MLS (20 UNITS TOTAL) INTO THE SKIN DAILY   metFORMIN (GLUCOPHAGE) 500 MG tablet Take 1 tablet (500 mg total) by mouth 2 (two) times daily with a meal.   metoprolol succinate (TOPROL-XL) 50 MG 24 hr tablet Take 1 tablet (50 mg total) by mouth in the morning and at bedtime. Take with or immediately following a meal.   Multiple Vitamins-Minerals (CENTRUM SILVER 50+MEN) TABS Take 1 tablet by mouth daily.   mupirocin ointment (BACTROBAN) 2 % Apply 1 Application topically 2 (two) times daily.   Oxycodone HCl 10 MG TABS Take 10 mg by mouth 3 (three) times daily.   pantoprazole (PROTONIX) 40 MG tablet Take 1 tablet by mouth once daily   potassium chloride SA (KLOR-CON M) 20 MEQ tablet Take 2 tablets (40 mEq total) by mouth daily.   QUEtiapine (SEROQUEL) 300 MG tablet Take 1.5 tablets (450 mg total) by mouth at bedtime.   sacubitril-valsartan (ENTRESTO) 24-26 MG Take 1 tablet by mouth 2 (two) times daily.   Semaglutide, 2 MG/DOSE, (OZEMPIC, 2 MG/DOSE,) 8 MG/3ML SOPN Inject 2 mg into the skin once a week. Monday   sildenafil (VIAGRA) 100 MG tablet Take 0.5-1 tablets (50-100 mg total) by mouth daily as needed for erectile dysfunction.   TRUEPLUS PEN NEEDLES 31G X 6 MM MISC USE AS DIRECTED   zinc gluconate 50 MG tablet Take 50 mg by mouth daily.   [DISCONTINUED] spironolactone (ALDACTONE) 25 MG tablet TAKE 1/2 (ONE-HALF) TABLET BY MOUTH AT BEDTIME    Current Outpatient Medications on File Prior to Encounter  Medication Sig Dispense Refill   acetaminophen (TYLENOL) 500 MG tablet Take 2 tablets (1,000 mg total) by mouth 2 (two) times daily as needed. 180 tablet 3   albuterol (VENTOLIN HFA) 108 (90 Base) MCG/ACT inhaler Inhale 2 puffs into the lungs every 6 (six) hours as needed for wheezing or shortness of breath. 18 g 1   aspirin EC 81 MG tablet Take 1 tablet (81 mg total) by mouth daily. Swallow whole. 90 tablet 3   atorvastatin (LIPITOR) 40 MG tablet TAKE 1 TABLET BY MOUTH  ONCE DAILY NEEDS  FOLLOW  UP  APPOINTMENT  FOR  ANYMORE  REFILLS 30 tablet 0  blood glucose meter kit and supplies KIT Dispense based on patient and insurance preference. Use up to four times daily as directed. (FOR ICD-9 250.00, 250.01). 1 each 0   colchicine 0.6 MG tablet Daily for 7 days then daily PRN for gout flare 30 tablet 0   Continuous Blood Gluc Receiver (FREESTYLE LIBRE 14 DAY READER) DEVI 1 each by Does not apply route as needed. 1 each 11   Continuous Blood Gluc Sensor (FREESTYLE LIBRE 14 DAY SENSOR) MISC USE 1 APPLICATOR EVERY 14 DAYS 2 each 0   cyclobenzaprine (FLEXERIL) 10 MG tablet Take 10 mg by mouth 3 (three) times daily as needed for muscle spasms.     diclofenac Sodium (VOLTAREN) 1 % GEL Apply 2 g topically daily as needed for pain.     empagliflozin (JARDIANCE) 10 MG TABS tablet TAKE 1 TABLET BY MOUTH ONCE DAILY . APPOINTMENT REQUIRED FOR FUTURE REFILLS 60 tablet 0   ferrous sulfate 325 (65 FE) MG tablet Take 1 tablet (325 mg total) by mouth every other day. (Patient taking differently: Take 325 mg by mouth daily with breakfast.) 30 tablet 1   furosemide (LASIX) 40 MG tablet TAKE 1 & 1/2 (ONE & ONE-HALF) TABLETS BY MOUTH TWICE DAILY 90 tablet 0   gabapentin (NEURONTIN) 800 MG tablet Take 1 tablet (800 mg total) by mouth every 6 (six) hours. 120 tablet 5   glipiZIDE (GLUCOTROL) 10 MG tablet Take 1 tablet (10 mg total) by mouth 2 (two) times daily before a meal. 180 tablet 0   HUMALOG MIX 75/25 KWIKPEN (75-25) 100 UNIT/ML KwikPen DIAL AND INJECT 30 UNITS UNDER THE SKIN TWICE DAILY 75 mL 0   LANTUS 100 UNIT/ML injection INJECT 0.2 MLS (20 UNITS TOTAL) INTO THE SKIN DAILY 30 mL 0   metFORMIN (GLUCOPHAGE) 500 MG tablet Take 1 tablet (500 mg total) by mouth 2 (two) times daily with a meal. 60 tablet 2   metoprolol succinate (TOPROL-XL) 50 MG 24 hr tablet Take 1 tablet (50 mg total) by mouth in the morning and at bedtime. Take with or immediately following a meal. 180 tablet 2    Multiple Vitamins-Minerals (CENTRUM SILVER 50+MEN) TABS Take 1 tablet by mouth daily.     mupirocin ointment (BACTROBAN) 2 % Apply 1 Application topically 2 (two) times daily. 22 g 0   Oxycodone HCl 10 MG TABS Take 10 mg by mouth 3 (three) times daily.     pantoprazole (PROTONIX) 40 MG tablet Take 1 tablet by mouth once daily 90 tablet 0   potassium chloride SA (KLOR-CON M) 20 MEQ tablet Take 2 tablets (40 mEq total) by mouth daily. 60 tablet 6   QUEtiapine (SEROQUEL) 300 MG tablet Take 1.5 tablets (450 mg total) by mouth at bedtime. 135 tablet 1   sacubitril-valsartan (ENTRESTO) 24-26 MG Take 1 tablet by mouth 2 (two) times daily. 180 tablet 3   Semaglutide, 2 MG/DOSE, (OZEMPIC, 2 MG/DOSE,) 8 MG/3ML SOPN Inject 2 mg into the skin once a week. Monday     sildenafil (VIAGRA) 100 MG tablet Take 0.5-1 tablets (50-100 mg total) by mouth daily as needed for erectile dysfunction. 5 tablet 11   TRUEPLUS PEN NEEDLES 31G X 6 MM MISC USE AS DIRECTED 100 each 0   zinc gluconate 50 MG tablet Take 50 mg by mouth daily.     metolazone (ZAROXOLYN) 2.5 MG tablet Take 1 tablet (2.5 mg total) by mouth as directed. (Patient not taking: Reported on 01/29/2023) 5 tablet 2   No  current facility-administered medications on file prior to encounter.   BP (!) 136/90   Pulse 73   Wt (!) 136.9 kg (301 lb 12.8 oz)   SpO2 96%   BMI 38.75 kg/m   Wt Readings from Last 3 Encounters:  01/29/23 (!) 136.9 kg (301 lb 12.8 oz)  01/29/23 (!) 137.8 kg (303 lb 12.8 oz)  01/19/23 (!) 137.9 kg (304 lb)   General: NAD Neck: No JVD, no thyromegaly or thyroid nodule.  Lungs: Clear to auscultation bilaterally with normal respiratory effort. CV: Nondisplaced PMI.  Heart regular S1/S2, no S3/S4, no murmur.  No peripheral edema.  No carotid bruit.  Normal pedal pulses.  Abdomen: Soft, nontender, no hepatosplenomegaly, no distention.  Skin: Intact without lesions or rashes.  Neurologic: Alert and oriented x 3.  Psych: Normal  affect. Extremities: No clubbing or cyanosis.  HEENT: Normal.   Assessment/Plan: 1. Chronic systolic CHF with recovery in LV function: Echo in 11/20 with EF 25-30%, echo 5/21 with EF 30% with mildly decreased RV systolic function. Biotronik ICD.  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 Echo 8/22 with improvement in LVEF to 50-55%. No longer drinking ETOH. TEE (1/23) EF 50-55%. NYHA class II. He is not volume overloaded by exam today.  - Continue Lasix 60 mg bid. BMET/BNP today. - Continue Toprol XL 50 mg bid.  - Continue Entresto 24/26 mg bid.  - Increase spironolactone to 25 mg daily. BMET 10 days.  - Continue Jardiance 10 mg daily.  - I will arrange for repeat echo.  2. 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 No further bleeding issues now that he is off anticoagulation s/p Watchman.  - CBC today.  - Continue PPI.  3. Paroxysmal atrial fibrillation: NSR today. CHADS2-VASc score = 4 (CAD, CHF, HTN, DM).  Off Eliquis with frequent GIB.  Now s/p Watchman 12/22. - Continue ASA 81 with Watchman.  4. CAD: Last cath 09/2021 with occluded OM2 stent, 90% small OM1, diffuse disease RCA treated medically.  No chest pain.  - On statin + ASA.  5. HTN: BP mildly elevated today.  - Increasing spironolactone as above.  6. OSA: wearing CPAP. 7. CKD 3a: BMET today. 8. Obesity: Body mass index is 38.75 kg/m.  Weight trending down.  - He is now on semaglutide but has been out x 2 wks, will message pharmacy clinic.  Follow up in 4 months with APP  Loralie Champagne 01/30/23

## 2023-02-01 ENCOUNTER — Telehealth (HOSPITAL_COMMUNITY): Payer: Self-pay

## 2023-02-01 NOTE — Telephone Encounter (Signed)
[name] contacted me reporting he went to pick up his medications at [facility] and his Lantus $105 after insurance and he is unable to afford this at this time. He says he has enough to get him through for a few days at home but will be unable to pay this amount going forward. I confirmed cost with [employer]. I will forward to PCP as he follows up with Dr. Nils Pyle on Monday in the office to see what other insulins would be cost effective for him. He agreed with plan. Call complete. I will follow up on Tuesday in the home.   [name], [place] 02/01/2023

## 2023-02-02 NOTE — Telephone Encounter (Signed)
Thank you :)

## 2023-02-02 NOTE — Telephone Encounter (Signed)
Thank you for letting me know. [name] could you please help with this. Thanks.

## 2023-02-02 NOTE — Telephone Encounter (Signed)
Reviewed patient. Appears he is in the midst of getting Ozempic assistance from [employer] (cardiology helped with this). He is ordered both Lantus 20 units daily and Humalog 75/25 30 units twice daily, in addition to glipizide 10 mg twice daily. Significant risk of hypoglycemia (and weight gain) with basal insulin + intermediate insulin + bolus insulin + glipizide.   Appears Humalog supply was approved through [employer] patient assistance through 2024. Patient has an order for [facility] CGM on his profile.   Attempted to contact patient, voicemail was full.   Recommend to see Tonya on Monday, check A1c. Unless significantly uncontrolled, would recommend to discontinue Lantus, continue Humalog mix for now. If any post prandial hypoglycemia, reduce glipizide to 5 mg twice daily.   Continue metformin 500 mg twice daily. Last renal function is appropriate for dose titration. Can increase to 1000 mg twice daily, or change to XR 500 mg two tablets twice daily if any diarrhea on IR dose.   With Ozempic titration moving forward (and possibly metformin titration, ability to increase Jardiance as well), goal would be to significantly minimize insulin burden.   I can call patient for follow up regarding DM management in ~ 2-3 weeks.   Catie Hedwig Schaner, PharmD, [place], [facility] [phone]

## 2023-02-02 NOTE — Telephone Encounter (Signed)
Patient approved for patient assistance. Shipment should arrive within 10 business days. Looks like they were going to send 4 of the '2mg'$  boxes. We had requested 1 of the '1mg'$  since pt is only on 0.'5mg'$  right now. They stated they could easily fix that.

## 2023-02-02 NOTE — Telephone Encounter (Signed)
Pt provided with Ozempic sample - will inject 0.'25mg'$  weekly x 2 weeks, then 0.'5mg'$  weekly.

## 2023-02-02 NOTE — Telephone Encounter (Signed)
Just got word today that he has been approved for pt assistance for Ozempic. Should have shipment within 2 weeks. We gave him samples of Ozempic 0.25/0.'5mg'$ . However patient has run out of this. I did speak with patient just now. Made him aware of his approval for pt ass. He knows that he has apt with PCP Monday.  He will come by office for 1 more sample of ozempic today. This will hold him over until his shipment comes.

## 2023-02-05 ENCOUNTER — Ambulatory Visit (INDEPENDENT_AMBULATORY_CARE_PROVIDER_SITE_OTHER): Payer: Medicare Other | Admitting: Nurse Practitioner

## 2023-02-05 ENCOUNTER — Encounter: Payer: Self-pay | Admitting: Nurse Practitioner

## 2023-02-05 VITALS — BP 137/81 | HR 63 | Temp 97.1°F | Ht 74.0 in | Wt 307.0 lb

## 2023-02-05 DIAGNOSIS — Z794 Long term (current) use of insulin: Secondary | ICD-10-CM | POA: Diagnosis not present

## 2023-02-05 DIAGNOSIS — E1165 Type 2 diabetes mellitus with hyperglycemia: Secondary | ICD-10-CM

## 2023-02-05 DIAGNOSIS — M2550 Pain in unspecified joint: Secondary | ICD-10-CM | POA: Diagnosis not present

## 2023-02-05 LAB — POCT GLYCOSYLATED HEMOGLOBIN (HGB A1C): Hemoglobin A1C: 7.5 % — AB (ref 4.0–5.6)

## 2023-02-05 MED ORDER — GLIPIZIDE 5 MG PO TABS: 5.0000 mg | ORAL_TABLET | Freq: Two times a day (BID) | ORAL | 3 refills | Status: DC

## 2023-02-05 MED ORDER — METFORMIN HCL ER 500 MG PO TB24: 500.0000 mg | ORAL_TABLET | Freq: Every day | ORAL | 2 refills | Status: DC

## 2023-02-05 MED ORDER — METFORMIN HCL ER 500 MG PO TB24: 500.0000 mg | ORAL_TABLET | Freq: Two times a day (BID) | ORAL | 2 refills | Status: DC

## 2023-02-05 NOTE — Assessment & Plan Note (Signed)
-   Microalbumin/Creatinine Ratio, Urine - Ambulatory referral to Ophthalmology - Ambulatory referral to Podiatry - POCT glycosylated hemoglobin (Hb A1C) - glipiZIDE (GLUCOTROL) 5 MG tablet; Take 1 tablet (5 mg total) by mouth 2 (two) times daily before a meal.  Dispense: 60 tablet; Refill: 3 - metFORMIN (GLUCOPHAGE-XR) 500 MG 24 hr tablet; Take 1 tablet (500 mg total) by mouth 2 (two) times daily with a meal.  Dispense: 60 tablet; Refill: 2  -strict diabetic diet  -stay active  -stay well hydrated    Follow up:  Follow up in 3 months

## 2023-02-05 NOTE — Patient Instructions (Addendum)
1. Type 2 diabetes mellitus with hyperglycemia, with long-term current use of insulin (HCC)  - Microalbumin/Creatinine Ratio, Urine - Ambulatory referral to Ophthalmology - Ambulatory referral to Podiatry - POCT glycosylated hemoglobin (Hb A1C) - glipiZIDE (GLUCOTROL) 5 MG tablet; Take 1 tablet (5 mg total) by mouth 2 (two) times daily before a meal.  Dispense: 60 tablet; Refill: 3 - metFORMIN (GLUCOPHAGE-XR) 500 MG 24 hr tablet; Take 1 tablet (500 mg total) by mouth 2 (two) times daily with a meal.  Dispense: 60 tablet; Refill: 2  -strict diabetic diet  -stay active  -stay well hydrated    Follow up:  Follow up in 3 months

## 2023-02-05 NOTE — Progress Notes (Signed)
$'@Patient'A$  ID: [name], male    DOB: Dec 12, 1960, 62 y.o.   MRN: [id number]  Chief Complaint  Patient presents with   Diabetes    Follow up    Referring provider: Fenton Foy, NP   HPI  [name] 62 y.o. male  has a past medical history of AICD (automatic cardioverter/defibrillator) present, Anxiety, Atrial fibrillation ([place]), CAD (coronary artery disease), Cardiomyopathy ([place]), CHF (congestive heart failure) ([place]) (09/2019), Depression, Diabetes mellitus, Erectile dysfunction (11/2019), GI bleeding, H/O right heart catheterization (09/2019), Hypertension, Presence of permanent cardiac pacemaker, Presence of Watchman left atrial appendage closure device (10/27/2021), Schizophrenia ([place]), and Sleep apnea.    Patient presents today for diabetes follow-up.  A1c in office today is 7.5.  Pharmacist is following patient for diabetic medication management.  We discussed that we would discontinue Lantus today.  Patient can continue Humalog mix for now.  We will reduce glipizide to 5 mg twice daily.  We will increase metformin to 500 mg XR twice daily.  Patient is compliant with medications.  He does need a referral for a new pain management specialist.  Patient does need to be more active but states that pain limits his activities. Denies f/c/s, n/v/d, hemoptysis, PND, leg swelling Denies chest pain or edema     No Known Allergies  Immunization History  Administered Date(s) Administered   Influenza,inj,Quad PF,6+ Mos 10/13/2019, 09/13/2020, 08/03/2021, 11/01/2022   PFIZER(Purple Top)SARS-COV-2 Vaccination 03/13/2020   Pneumococcal Polysaccharide-23 10/16/2019   Tdap 09/13/2020   Zoster Recombinat (Shingrix) 08/03/2021    Past Medical History:  Diagnosis Date   AICD (automatic cardioverter/defibrillator) present    Anxiety    Atrial fibrillation (HCC)    CAD (coronary artery disease)    Cardiomyopathy ([place])    CHF (congestive heart failure) ([place]) 09/2019    Depression    Diabetes mellitus    Erectile dysfunction 11/2019   GI bleeding    H/O right heart catheterization 09/2019   Hypertension    Presence of permanent cardiac pacemaker    Presence of Watchman left atrial appendage closure device 10/27/2021   27 mm Watchman Flex Device per Dr. Quentin Ore   Schizophrenia [facility])    Sleep apnea    uses cpap    Tobacco History: Social History   Tobacco Use  Smoking Status Never   Passive exposure: Never  Smokeless Tobacco Never   Counseling given: Not Answered   Outpatient Encounter Medications as of 02/05/2023  Medication Sig   acetaminophen (TYLENOL) 500 MG tablet Take 2 tablets (1,000 mg total) by mouth 2 (two) times daily as needed.   aspirin EC 81 MG tablet Take 1 tablet (81 mg total) by mouth daily. Swallow whole.   atorvastatin (LIPITOR) 40 MG tablet Take 1 tablet (40 mg total) by mouth daily.   blood glucose meter kit and supplies KIT Dispense based on patient and insurance preference. Use up to four times daily as directed. (FOR ICD-9 250.00, 250.01).   colchicine 0.6 MG tablet Daily for 7 days then daily PRN for gout flare   Continuous Blood Gluc Receiver (FREESTYLE LIBRE 14 DAY READER) DEVI 1 each by Does not apply route as needed.   Continuous Blood Gluc Sensor (FREESTYLE LIBRE 14 DAY SENSOR) MISC USE AS DIRECTED EVERY  14  DAYS   cyclobenzaprine (FLEXERIL) 10 MG tablet Take 10 mg by mouth 3 (three) times daily as needed for muscle spasms.   diclofenac Sodium (VOLTAREN) 1 % GEL Apply 2 g topically daily as  needed for pain.   empagliflozin (JARDIANCE) 10 MG TABS tablet TAKE 1 TABLET BY MOUTH ONCE DAILY . APPOINTMENT REQUIRED FOR FUTURE REFILLS   ferrous sulfate 325 (65 FE) MG tablet Take 1 tablet (325 mg total) by mouth every other day. (Patient taking differently: Take 325 mg by mouth daily with breakfast.)   furosemide (LASIX) 40 MG tablet Take 1.5 tablets (60 mg total) by mouth 2 (two) times daily.   gabapentin (NEURONTIN) 800 MG  tablet Take 1 tablet (800 mg total) by mouth every 6 (six) hours.   glipiZIDE (GLUCOTROL) 5 MG tablet Take 1 tablet (5 mg total) by mouth 2 (two) times daily before a meal.   HUMALOG MIX 75/25 KWIKPEN (75-25) 100 UNIT/ML KwikPen DIAL AND INJECT 30 UNITS UNDER THE SKIN TWICE DAILY   metoprolol succinate (TOPROL-XL) 50 MG 24 hr tablet Take 1 tablet (50 mg total) by mouth in the morning and at bedtime. Take with or immediately following a meal.   Multiple Vitamins-Minerals (CENTRUM SILVER 50+MEN) TABS Take 1 tablet by mouth daily.   pantoprazole (PROTONIX) 40 MG tablet Take 1 tablet by mouth once daily   potassium chloride SA (KLOR-CON M) 20 MEQ tablet Take 2 tablets (40 mEq total) by mouth daily.   QUEtiapine (SEROQUEL) 300 MG tablet Take 1.5 tablets (450 mg total) by mouth at bedtime.   RELION PEN NEEDLES 31G X 6 MM MISC INJECT 1 PEN INTO THE SKIN 2 TIMES DAILY   sacubitril-valsartan (ENTRESTO) 24-26 MG Take 1 tablet by mouth 2 (two) times daily.   Semaglutide, 2 MG/DOSE, (OZEMPIC, 2 MG/DOSE,) 8 MG/3ML SOPN Inject 2 mg into the skin once a week. Monday   sildenafil (VIAGRA) 100 MG tablet Take 0.5-1 tablets (50-100 mg total) by mouth daily as needed for erectile dysfunction.   spironolactone (ALDACTONE) 25 MG tablet Take 1 tablet (25 mg total) by mouth daily.   zinc gluconate 50 MG tablet Take 50 mg by mouth daily.   [DISCONTINUED] glipiZIDE (GLUCOTROL) 10 MG tablet Take 1 tablet (10 mg total) by mouth 2 (two) times daily before a meal.   [DISCONTINUED] LANTUS 100 UNIT/ML injection INJECT 0.2 MLS (20 UNITS TOTAL) INTO THE SKIN DAILY   [DISCONTINUED] metFORMIN (GLUCOPHAGE) 500 MG tablet Take 1 tablet (500 mg total) by mouth 2 (two) times daily with a meal.   [DISCONTINUED] metFORMIN (GLUCOPHAGE-XR) 500 MG 24 hr tablet Take 1 tablet (500 mg total) by mouth daily with breakfast.   albuterol (VENTOLIN HFA) 108 (90 Base) MCG/ACT inhaler Inhale 2 puffs into the lungs every 6 (six) hours as needed for  wheezing or shortness of breath. (Patient not taking: Reported on 02/05/2023)   metFORMIN (GLUCOPHAGE-XR) 500 MG 24 hr tablet Take 1 tablet (500 mg total) by mouth 2 (two) times daily with a meal.   metolazone (ZAROXOLYN) 2.5 MG tablet Take 1 tablet (2.5 mg total) by mouth as directed. (Patient not taking: Reported on 01/29/2023)   mupirocin ointment (BACTROBAN) 2 % Apply 1 Application topically 2 (two) times daily. (Patient not taking: Reported on 02/05/2023)   Oxycodone HCl 10 MG TABS Take 10 mg by mouth 3 (three) times daily. (Patient not taking: Reported on 02/05/2023)   No facility-administered encounter medications on file as of 02/05/2023.     Review of Systems  Review of Systems  Constitutional: Negative.   HENT: Negative.    Cardiovascular: Negative.   Gastrointestinal: Negative.   Allergic/Immunologic: Negative.   Neurological: Negative.   Psychiatric/Behavioral: Negative.  Physical Exam  BP 137/81   Pulse 63   Temp (!) 97.1 F (36.2 C)   Ht '6\' 2"'$  (1.88 m)   Wt (!) 307 lb (139.3 kg)   SpO2 97%   BMI 39.42 kg/m   Wt Readings from Last 5 Encounters:  02/05/23 (!) 307 lb (139.3 kg)  01/29/23 (!) 301 lb 12.8 oz (136.9 kg)  01/29/23 (!) 303 lb 12.8 oz (137.8 kg)  01/19/23 (!) 304 lb (137.9 kg)  01/02/23 (!) 304 lb (137.9 kg)     Physical Exam Vitals and nursing note reviewed.  Constitutional:      General: He is not in acute distress.    Appearance: He is well-developed.  Cardiovascular:     Rate and Rhythm: Normal rate and regular rhythm.  Pulmonary:     Effort: Pulmonary effort is normal.     Breath sounds: Normal breath sounds.  Skin:    General: Skin is warm and dry.  Neurological:     Mental Status: He is alert and oriented to person, place, and time.      Assessment & Plan:   Type 2 diabetes mellitus with hyperglycemia, with long-term current use of insulin (HCC) - Microalbumin/Creatinine Ratio, Urine - Ambulatory referral to  Ophthalmology - Ambulatory referral to Podiatry - POCT glycosylated hemoglobin (Hb A1C) - glipiZIDE (GLUCOTROL) 5 MG tablet; Take 1 tablet (5 mg total) by mouth 2 (two) times daily before a meal.  Dispense: 60 tablet; Refill: 3 - metFORMIN (GLUCOPHAGE-XR) 500 MG 24 hr tablet; Take 1 tablet (500 mg total) by mouth 2 (two) times daily with a meal.  Dispense: 60 tablet; Refill: 2  -strict diabetic diet  -stay active  -stay well hydrated    Follow up:  Follow up in 3 months     Fenton Foy, NP 02/05/2023

## 2023-02-06 ENCOUNTER — Other Ambulatory Visit (HOSPITAL_COMMUNITY): Payer: Self-pay

## 2023-02-06 LAB — MICROALBUMIN / CREATININE URINE RATIO
Creatinine, Urine: 53.4 mg/dL
Microalb/Creat Ratio: 109 mg/g creat — ABNORMAL HIGH (ref 0–29)
Microalbumin, Urine: 58.4 ug/mL

## 2023-02-06 NOTE — Progress Notes (Signed)
Paramedicine Encounter    Patient ID: [name], male    DOB: 04/11/1961, 62 y.o.   MRN: [id number]   Met with [name] in the home to review and confirm meds and to fill pill boxes. He was seen by PCP  yesterday so assessment and vitals were not obtained. He reports to be feeling well with no complaints. Meds confirmed and pill box filled for two weeks. We reviewed upcoming appointments and confirmed same. Refills needed: Sidenafil [place]:[id number] called into [facility].  [name] was assisted with some reading of bills and paying them online. Home visit complete. [name] in two weeks.   [name], [place] 02/06/2023      Patient Care Team: Fenton Foy, NP as PCP - General (Pulmonary Disease) [name], MD as PCP - Cardiology (Cardiology) [name], MD as PCP - Advanced Heart Failure (Cardiology) [name], MD as PCP - Electrophysiology (Cardiology) [name], LCSW as Social Worker (Licensed Clinical Social Worker) [name], MD as Consulting Physician (Gastroenterology)  Patient Active Problem List   Diagnosis Date Noted   Multiple joint pain 12/22/2022   Obesity (BMI 35.0-39.9 without comorbidity) 07/18/2022   Cellulitis 06/22/2022   Cellulitis, leg 06/20/2022   Presence of Watchman left atrial appendage closure device 10/28/2021   Atrial fibrillation ([place]) 10/27/2021   [place]    Chronic diastolic CHF (congestive heart failure) ([place])    GI bleed 06/29/2021   Severe anemia 06/29/2021   Adenomatous polyp of ascending colon    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   Acute blood loss anemia    Angiodysplasia of stomach    Chronic anticoagulation    CHF exacerbation ([place]) 06/15/2020   AKI (acute kidney injury) ([place]) 06/15/2020   CKD (chronic kidney disease), stage III ([place]) 06/15/2020   Anemia due to chronic blood loss    Gastric AVM    Angiodysplasia of duodenum with hemorrhage     Chronic systolic CHF (congestive heart failure) ([place]) 04/05/2020   Anemia 04/05/2020   PAF (paroxysmal atrial fibrillation) ([place]) 04/05/2020   OSA (obstructive sleep apnea) 04/05/2020   Syncope and collapse 04/05/2020   Type 2 diabetes mellitus with hyperglycemia, with long-term current use of insulin ([place]) 12/03/2019   Diabetic polyneuropathy associated with type 2 diabetes mellitus ([place]) 12/03/2019   Erectile dysfunction 12/03/2019   Paranoid schizophrenia, chronic condition ([place]) 01/26/2015   Severe recurrent major depressive disorder with psychotic features ([place]) 01/26/2015   GAD (generalized anxiety disorder) 01/26/2015   OCD (obsessive compulsive disorder) 01/26/2015   Panic disorder without agoraphobia 01/26/2015   Insomnia 01/26/2015    Current Outpatient Medications:    acetaminophen (TYLENOL) 500 MG tablet, Take 2 tablets (1,000 mg total) by mouth 2 (two) times daily as needed., Disp: 180 tablet, Rfl: 3   albuterol (VENTOLIN HFA) 108 (90 Base) MCG/ACT inhaler, Inhale 2 puffs into the lungs every 6 (six) hours as needed for wheezing or shortness of breath. (Patient not taking: Reported on 02/05/2023), Disp: 18 g, Rfl: 1   aspirin EC 81 MG tablet, Take 1 tablet (81 mg total) by mouth daily. Swallow whole., Disp: 90 tablet, Rfl: 3   atorvastatin (LIPITOR) 40 MG tablet, Take 1 tablet (40 mg total) by mouth daily., Disp: 90 tablet, Rfl: 3   blood glucose meter kit and supplies KIT, Dispense based on patient and insurance preference. Use up to four times daily as directed. (FOR ICD-9 250.00, 250.01)., Disp: 1 each, Rfl: 0  colchicine 0.6 MG tablet, Daily for 7 days then daily PRN for gout flare, Disp: 30 tablet, Rfl: 0   Continuous Blood Gluc Receiver (FREESTYLE LIBRE 14 DAY READER) DEVI, 1 each by Does not apply route as needed., Disp: 1 each, Rfl: 11   Continuous Blood Gluc Sensor (FREESTYLE LIBRE 14 DAY SENSOR) MISC, USE AS DIRECTED EVERY  14  DAYS, Disp: 2 each, Rfl: 0    cyclobenzaprine (FLEXERIL) 10 MG tablet, Take 10 mg by mouth 3 (three) times daily as needed for muscle spasms., Disp: , Rfl:    diclofenac Sodium (VOLTAREN) 1 % GEL, Apply 2 g topically daily as needed for pain., Disp: , Rfl:    empagliflozin (JARDIANCE) 10 MG TABS tablet, TAKE 1 TABLET BY MOUTH ONCE DAILY . APPOINTMENT REQUIRED FOR FUTURE REFILLS, Disp: 60 tablet, Rfl: 0   ferrous sulfate 325 (65 FE) MG tablet, Take 1 tablet (325 mg total) by mouth every other day. (Patient taking differently: Take 325 mg by mouth daily with breakfast.), Disp: 30 tablet, Rfl: 1   furosemide (LASIX) 40 MG tablet, Take 1.5 tablets (60 mg total) by mouth 2 (two) times daily., Disp: 90 tablet, Rfl: 11   gabapentin (NEURONTIN) 800 MG tablet, Take 1 tablet (800 mg total) by mouth every 6 (six) hours., Disp: 120 tablet, Rfl: 5   glipiZIDE (GLUCOTROL) 5 MG tablet, Take 1 tablet (5 mg total) by mouth 2 (two) times daily before a meal., Disp: 60 tablet, Rfl: 3   HUMALOG MIX 75/25 KWIKPEN (75-25) 100 UNIT/ML KwikPen, DIAL AND INJECT 30 UNITS UNDER THE SKIN TWICE DAILY, Disp: 75 mL, Rfl: 0   metFORMIN (GLUCOPHAGE-XR) 500 MG 24 hr tablet, Take 1 tablet (500 mg total) by mouth 2 (two) times daily with a meal., Disp: 60 tablet, Rfl: 2   metolazone (ZAROXOLYN) 2.5 MG tablet, Take 1 tablet (2.5 mg total) by mouth as directed. (Patient not taking: Reported on 01/29/2023), Disp: 5 tablet, Rfl: 2   metoprolol succinate (TOPROL-XL) 50 MG 24 hr tablet, Take 1 tablet (50 mg total) by mouth in the morning and at bedtime. Take with or immediately following a meal., Disp: 180 tablet, Rfl: 2   Multiple Vitamins-Minerals (CENTRUM SILVER 50+MEN) TABS, Take 1 tablet by mouth daily., Disp: , Rfl:    mupirocin ointment (BACTROBAN) 2 %, Apply 1 Application topically 2 (two) times daily. (Patient not taking: Reported on 02/05/2023), Disp: 22 g, Rfl: 0   Oxycodone HCl 10 MG TABS, Take 10 mg by mouth 3 (three) times daily. (Patient not taking: Reported on  02/05/2023), Disp: , Rfl:    pantoprazole (PROTONIX) 40 MG tablet, Take 1 tablet by mouth once daily, Disp: 90 tablet, Rfl: 0   potassium chloride SA (KLOR-CON M) 20 MEQ tablet, Take 2 tablets (40 mEq total) by mouth daily., Disp: 60 tablet, Rfl: 6   QUEtiapine (SEROQUEL) 300 MG tablet, Take 1.5 tablets (450 mg total) by mouth at bedtime., Disp: 135 tablet, Rfl: 1   RELION PEN NEEDLES 31G X 6 MM MISC, INJECT 1 PEN INTO THE SKIN 2 TIMES DAILY, Disp: 100 each, Rfl: 0   sacubitril-valsartan (ENTRESTO) 24-26 MG, Take 1 tablet by mouth 2 (two) times daily., Disp: 180 tablet, Rfl: 3   Semaglutide, 2 MG/DOSE, (OZEMPIC, 2 MG/DOSE,) 8 MG/3ML SOPN, Inject 2 mg into the skin once a week. Monday, Disp: , Rfl:    sildenafil (VIAGRA) 100 MG tablet, Take 0.5-1 tablets (50-100 mg total) by mouth daily as needed for erectile dysfunction., Disp: 5  tablet, Rfl: 11   spironolactone (ALDACTONE) 25 MG tablet, Take 1 tablet (25 mg total) by mouth daily., Disp: 90 tablet, Rfl: 3   zinc gluconate 50 MG tablet, Take 50 mg by mouth daily., Disp: , Rfl:  No Known Allergies   Social History   Socioeconomic History   Marital status: Legally Separated    Spouse name: Not on file   Number of children: Not on file   Years of education: Not on file   Highest education level: Not on file  Occupational History   Not on file  Tobacco Use   Smoking status: Never    Passive exposure: Never   Smokeless tobacco: Never  Vaping Use   Vaping Use: Never used  Substance and Sexual Activity   Alcohol use: Not Currently    Alcohol/week: 2.0 standard drinks of alcohol    Types: 2 Cans of beer per week   Drug use: No   Sexual activity: Yes    Birth control/protection: None  Other Topics Concern   Not on file  Social History Narrative   Not on file   Social Determinants of Health   Financial Resource Strain: Low Risk  (12/23/2019)   Overall Financial Resource Strain (CARDIA)    Difficulty of Paying Living Expenses: Not very  hard  Food Insecurity: No Food Insecurity (07/01/2021)   Hunger Vital Sign    Worried About Running Out of Food in the Last Year: Never true    Ran Out of Food in the Last Year: Never true  Transportation Needs: Unmet Transportation Needs (07/01/2021)   PRAPARE - Hydrologist (Medical): Yes    Lack of Transportation (Non-Medical): Yes  Physical Activity: Not on file  Stress: Not on file  Social Connections: Not on file  Intimate Partner Violence: Not on file    Physical Exam      Future Appointments  Date Time Provider [place]  02/08/2023 12:00 PM [place] [facility] None  02/19/2023 12:50 PM [facility]  02/26/2023  2:40 PM Jennye Boroughs, MD [facility] CPR  03/02/2023  7:40 AM [facility] DEVICE REMOTES [facility]  05/08/2023  8:00 AM Benjamine Mola, Resa Miner, MD [facility] None  05/14/2023 11:00 AM Fenton Foy, NP [place] None  06/01/2023  7:40 AM [facility] DEVICE REMOTES [facility]     ACTION: Home visit completed

## 2023-02-07 ENCOUNTER — Other Ambulatory Visit: Payer: Self-pay | Admitting: Nurse Practitioner

## 2023-02-07 DIAGNOSIS — Z794 Long term (current) use of insulin: Secondary | ICD-10-CM

## 2023-02-07 MED ORDER — METFORMIN HCL ER 500 MG PO TB24: 500.0000 mg | ORAL_TABLET | Freq: Two times a day (BID) | ORAL | 2 refills | Status: DC

## 2023-02-08 ENCOUNTER — Ambulatory Visit (HOSPITAL_COMMUNITY)
Admission: RE | Admit: 2023-02-08 | Discharge: 2023-02-08 | Disposition: A | Discharge status: Home / Self Care | Payer: Medicare Other | Source: Ambulatory Visit | Attending: Cardiology | Admitting: Cardiology

## 2023-02-08 DIAGNOSIS — I5032 Chronic diastolic (congestive) heart failure: Secondary | ICD-10-CM | POA: Insufficient documentation

## 2023-02-08 LAB — BASIC METABOLIC PANEL
Anion gap: 11 (ref 5–15)
BUN: 13 mg/dL (ref 8–23)
CO2: 27 mmol/L (ref 22–32)
Calcium: 9.1 mg/dL (ref 8.9–10.3)
Chloride: 101 mmol/L (ref 98–111)
Creatinine, Ser: 1.18 mg/dL (ref 0.61–1.24)
GFR, Estimated: >60 mL/min (ref >60)
Glucose, Bld: 194 mg/dL — ABNORMAL HIGH (ref 70–99)
Potassium: 4.6 mmol/L (ref 3.5–5.1)
Sodium: 139 mmol/L (ref 135–145)

## 2023-02-12 ENCOUNTER — Telehealth: Payer: Self-pay

## 2023-02-12 NOTE — Telephone Encounter (Signed)
Patient called back returning call. I let patient know a nurse will call back as they are with another patient

## 2023-02-12 NOTE — Telephone Encounter (Signed)
Patient called and denies any symptoms and reports compliance with mediations. Advised we will contact if any changes are recommended. Patient UTD on in-clinic apt. Appreciative of call.

## 2023-02-12 NOTE — Telephone Encounter (Signed)
Biotronik ICD alert received for HVR event detected, unknown duration. Possibly NSVT, no therapies delivered. Attempted to contact patient to assess for s/s considering duration. No answer, LMTCB.

## 2023-02-13 NOTE — Telephone Encounter (Signed)
Shipment from [employer] arrived. They sent 4 boxes of the 2mg  instead of the 1 box of 1mg  and 3 of the 2mg . Will have to give him a sample of 0.25/0.5mg  pen to get in 2 doses of 1mg  (will need to give 2 injections of 0.5mg )  With the pen you have now, finish injecting 0.5mg  weekly When that pen is empty use the new 0.25/0.5mg  pen to give 2 injections of 0.5mg  (to equal 1mg ) once a week for 2 weeks Then start taking 2mg  once weekly  Patient provided instructions and will pick up medications.

## 2023-02-15 NOTE — Telephone Encounter (Signed)
[employer] has sent the 1mg  Ozmepic dose. Called pt and LVM for him to call back. He will need to be given new instructions.  Use 0.5mg  pen to continue giving 0.5mg  weekly until both pens are gone Then use 1mg  pen to give 1mg  weekly x 4 weeks  Then use 2mg  pen to give 2mg  weekly

## 2023-02-19 ENCOUNTER — Ambulatory Visit (HOSPITAL_COMMUNITY): Admission: RE | Admit: 2023-02-19 | Payer: Medicare Other | Source: Ambulatory Visit

## 2023-02-20 ENCOUNTER — Telehealth (HOSPITAL_COMMUNITY): Payer: Self-pay

## 2023-02-20 ENCOUNTER — Other Ambulatory Visit: Payer: Self-pay | Admitting: Nurse Practitioner

## 2023-02-20 ENCOUNTER — Other Ambulatory Visit (HOSPITAL_COMMUNITY): Payer: Self-pay

## 2023-02-20 NOTE — Progress Notes (Signed)
Paramedicine Encounter    Patient ID: [name], male    DOB: 1961/09/01, 62 y.o.   MRN: [id number]   Complaints- none   Assessment- CAOX4, warm and dry, ambulatory with no shortness of breath, no swelling.   Compliance with meds- 5 missed nighttime doses over the last two weeks.   Pill box filled- two weeks.   Refills needed-  Entresto Potassium Metoprolol Metformin Quietapine Freestyle Sensors Sidenafil   Meds changes since last visit- NONE       Social changes- NONE    BP 120/82   Pulse 65   Resp 16   Wt (!) 301 lb (136.5 kg)   SpO2 92%   BMI 38.65 kg/m  Weight yesterday-- N/A Last visit weight-307lbs   Arrived for home visit for [name] who was outside working on his car. He reports to be feeling well with no complaints. He was ambulatory with no shortness of breath. Once inside we reviewed medications. He missed 5 nighttime doses over the last two weeks. He reports this is because he was staying at his wife's house and forgot to bring his pill box at nighttime. I encouraged him to be sure he takes his pill box with him when he travels. He agreed with this plan. I reviewed meds and filled two weeks of pill boxes for him. Refills called into [facility] as noted. We reviewed upcoming appointments. He missed his echocardiogram that was scheduled yesterday. I advised him to have PCP reschedule same if needed. I will message them for same. Appointments reviewed and confirmed. Home visit complete.   [name], [place]  ACTION: Home visit completed    Patient Care Team: Fenton Foy, NP as PCP - General (Pulmonary Disease) [name], MD as PCP - Cardiology (Cardiology) [name], MD as PCP - Advanced Heart Failure (Cardiology) [name], MD as PCP - Electrophysiology (Cardiology) [name], LCSW as Social Worker (Licensed Clinical Social Worker) [name], MD as Consulting Physician  (Gastroenterology)  Patient Active Problem List   Diagnosis Date Noted   Multiple joint pain 12/22/2022   Obesity (BMI 35.0-39.9 without comorbidity) 07/18/2022   Cellulitis 06/22/2022   Cellulitis, leg 06/20/2022   Presence of Watchman left atrial appendage closure device 10/28/2021   Atrial fibrillation ([place]) 10/27/2021   [place]    Chronic diastolic CHF (congestive heart failure) ([place])    GI bleed 06/29/2021   Severe anemia 06/29/2021   Adenomatous polyp of ascending colon    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   Acute blood loss anemia    Angiodysplasia of stomach    Chronic anticoagulation    CHF exacerbation ([place]) 06/15/2020   AKI (acute kidney injury) ([place]) 06/15/2020   CKD (chronic kidney disease), stage III ([place]) 06/15/2020   Anemia due to chronic blood loss    Gastric AVM    Angiodysplasia of duodenum with hemorrhage    Chronic systolic CHF (congestive heart failure) ([place]) 04/05/2020   Anemia 04/05/2020   PAF (paroxysmal atrial fibrillation) ([place]) 04/05/2020   OSA (obstructive sleep apnea) 04/05/2020   Syncope and collapse 04/05/2020   Type 2 diabetes mellitus with hyperglycemia, with long-term current use of insulin ([place]) 12/03/2019   Diabetic polyneuropathy associated with type 2 diabetes mellitus ([place]) 12/03/2019   Erectile dysfunction 12/03/2019   Paranoid schizophrenia, chronic condition ([place]) 01/26/2015   Severe recurrent major depressive disorder with psychotic features ([place]) 01/26/2015   GAD (generalized anxiety disorder) 01/26/2015   OCD (obsessive compulsive  disorder) 01/26/2015   Panic disorder without agoraphobia 01/26/2015   Insomnia 01/26/2015    Current Outpatient Medications:    acetaminophen (TYLENOL) 500 MG tablet, Take 2 tablets (1,000 mg total) by mouth 2 (two) times daily as needed., Disp: 180 tablet, Rfl: 3   aspirin EC 81 MG tablet, Take 1 tablet (81 mg total) by mouth daily. Swallow whole., Disp: 90 tablet, Rfl:  3   atorvastatin (LIPITOR) 40 MG tablet, Take 1 tablet (40 mg total) by mouth daily., Disp: 90 tablet, Rfl: 3   blood glucose meter kit and supplies KIT, Dispense based on patient and insurance preference. Use up to four times daily as directed. (FOR ICD-9 250.00, 250.01)., Disp: 1 each, Rfl: 0   Continuous Blood Gluc Receiver (FREESTYLE LIBRE 14 DAY READER) DEVI, 1 each by Does not apply route as needed., Disp: 1 each, Rfl: 11   Continuous Blood Gluc Sensor (FREESTYLE LIBRE 14 DAY SENSOR) MISC, USE AS DIRECTED EVERY  14  DAYS, Disp: 2 each, Rfl: 0   cyclobenzaprine (FLEXERIL) 10 MG tablet, Take 10 mg by mouth 3 (three) times daily as needed for muscle spasms., Disp: , Rfl:    diclofenac Sodium (VOLTAREN) 1 % GEL, Apply 2 g topically daily as needed for pain., Disp: , Rfl:    empagliflozin (JARDIANCE) 10 MG TABS tablet, TAKE 1 TABLET BY MOUTH ONCE DAILY . APPOINTMENT REQUIRED FOR FUTURE REFILLS, Disp: 60 tablet, Rfl: 0   ferrous sulfate 325 (65 FE) MG tablet, Take 1 tablet (325 mg total) by mouth every other day. (Patient taking differently: Take 325 mg by mouth daily with breakfast.), Disp: 30 tablet, Rfl: 1   furosemide (LASIX) 40 MG tablet, Take 1.5 tablets (60 mg total) by mouth 2 (two) times daily., Disp: 90 tablet, Rfl: 11   gabapentin (NEURONTIN) 800 MG tablet, Take 1 tablet (800 mg total) by mouth every 6 (six) hours., Disp: 120 tablet, Rfl: 5   glipiZIDE (GLUCOTROL) 5 MG tablet, Take 1 tablet (5 mg total) by mouth 2 (two) times daily before a meal., Disp: 60 tablet, Rfl: 3   HUMALOG MIX 75/25 KWIKPEN (75-25) 100 UNIT/ML KwikPen, DIAL AND INJECT 30 UNITS UNDER THE SKIN TWICE DAILY, Disp: 75 mL, Rfl: 0   metFORMIN (GLUCOPHAGE-XR) 500 MG 24 hr tablet, Take 1 tablet (500 mg total) by mouth 2 (two) times daily with a meal., Disp: 60 tablet, Rfl: 2   metoprolol succinate (TOPROL-XL) 50 MG 24 hr tablet, Take 1 tablet (50 mg total) by mouth in the morning and at bedtime. Take with or immediately  following a meal., Disp: 180 tablet, Rfl: 2   Multiple Vitamins-Minerals (CENTRUM SILVER 50+MEN) TABS, Take 1 tablet by mouth daily., Disp: , Rfl:    pantoprazole (PROTONIX) 40 MG tablet, Take 1 tablet by mouth once daily, Disp: 90 tablet, Rfl: 0   potassium chloride SA (KLOR-CON M) 20 MEQ tablet, Take 2 tablets (40 mEq total) by mouth daily., Disp: 60 tablet, Rfl: 6   QUEtiapine (SEROQUEL) 300 MG tablet, Take 1.5 tablets (450 mg total) by mouth at bedtime., Disp: 135 tablet, Rfl: 1   RELION PEN NEEDLES 31G X 6 MM MISC, INJECT 1 PEN INTO THE SKIN 2 TIMES DAILY, Disp: 100 each, Rfl: 0   sacubitril-valsartan (ENTRESTO) 24-26 MG, Take 1 tablet by mouth 2 (two) times daily., Disp: 180 tablet, Rfl: 3   Semaglutide, 2 MG/DOSE, (OZEMPIC, 2 MG/DOSE,) 8 MG/3ML SOPN, Inject 2 mg into the skin once a week. Monday, Disp: , Rfl:  sildenafil (VIAGRA) 100 MG tablet, Take 0.5-1 tablets (50-100 mg total) by mouth daily as needed for erectile dysfunction., Disp: 5 tablet, Rfl: 11   spironolactone (ALDACTONE) 25 MG tablet, Take 1 tablet (25 mg total) by mouth daily., Disp: 90 tablet, Rfl: 3   albuterol (VENTOLIN HFA) 108 (90 Base) MCG/ACT inhaler, Inhale 2 puffs into the lungs every 6 (six) hours as needed for wheezing or shortness of breath. (Patient not taking: Reported on 02/05/2023), Disp: 18 g, Rfl: 1   colchicine 0.6 MG tablet, Daily for 7 days then daily PRN for gout flare (Patient not taking: Reported on 02/20/2023), Disp: 30 tablet, Rfl: 0   metolazone (ZAROXOLYN) 2.5 MG tablet, Take 1 tablet (2.5 mg total) by mouth as directed. (Patient not taking: Reported on 01/29/2023), Disp: 5 tablet, Rfl: 2   mupirocin ointment (BACTROBAN) 2 %, Apply 1 Application topically 2 (two) times daily. (Patient not taking: Reported on 02/05/2023), Disp: 22 g, Rfl: 0   Oxycodone HCl 10 MG TABS, Take 10 mg by mouth 3 (three) times daily. (Patient not taking: Reported on 02/05/2023), Disp: , Rfl:    zinc gluconate 50 MG tablet, Take 50  mg by mouth daily. (Patient not taking: Reported on 02/20/2023), Disp: , Rfl:  No Known Allergies   Social History   Socioeconomic History   Marital status: Legally Separated    Spouse name: Not on file   Number of children: Not on file   Years of education: Not on file   Highest education level: Not on file  Occupational History   Not on file  Tobacco Use   Smoking status: Never    Passive exposure: Never   Smokeless tobacco: Never  Vaping Use   Vaping Use: Never used  Substance and Sexual Activity   Alcohol use: Not Currently    Alcohol/week: 2.0 standard drinks of alcohol    Types: 2 Cans of beer per week   Drug use: No   Sexual activity: Yes    Birth control/protection: None  Other Topics Concern   Not on file  Social History Narrative   Not on file   Social Determinants of Health   Financial Resource Strain: Low Risk  (12/23/2019)   Overall Financial Resource Strain (CARDIA)    Difficulty of Paying Living Expenses: Not very hard  Food Insecurity: No Food Insecurity (07/01/2021)   Hunger Vital Sign    Worried About Running Out of Food in the Last Year: Never true    Ran Out of Food in the Last Year: Never true  Transportation Needs: Unmet Transportation Needs (07/01/2021)   PRAPARE - Hydrologist (Medical): Yes    Lack of Transportation (Non-Medical): Yes  Physical Activity: Not on file  Stress: Not on file  Social Connections: Not on file  Intimate Partner Violence: Not on file    Physical Exam      Future Appointments  Date Time Provider [place]  02/26/2023  2:40 PM Jennye Boroughs, MD [facility] CPR  02/27/2023  1:00 PM Kaylyn Layer T, RPH-CPP [facility] None  03/02/2023  7:40 AM [facility] DEVICE REMOTES [facility]  05/08/2023  8:00 AM Benjamine Mola, Resa Miner, MD [facility] None  05/14/2023 11:00 AM Fenton Foy, NP [facility] None  06/01/2023  7:40 AM [facility] DEVICE REMOTES [facility]

## 2023-02-20 NOTE — Telephone Encounter (Signed)
[name] missed his echocardiogram appointment scheduled for yesterday. I advised him that he would need to reach out to PCP to reschedule but he asked me to reach out to assist as he struggles to keep up with appointments. I will message PCP to see if and how I can reschedule this for him as it was ordered by PCP not HF clinic.   [name], [place] 02/20/2023

## 2023-02-21 ENCOUNTER — Telehealth: Payer: Self-pay | Admitting: Nurse Practitioner

## 2023-02-21 NOTE — Telephone Encounter (Signed)
Called patient to schedule Medicare Annual Wellness Visit (AWV). Left message for patient to call back and schedule Medicare Annual Wellness Visit (AWV).  Last date of AWV: AWVI eligible as of 05/27/2013  Please schedule an AWVI appointment at any time with [place].  If any questions, please contact me at [phone].    Thank you,  [place] Direct dial  [phone]

## 2023-02-22 ENCOUNTER — Telehealth: Payer: Self-pay

## 2023-02-22 NOTE — Telephone Encounter (Signed)
[employer] alert received for NST 02/21/2023 @ 03:18 AM. Patient denies any symptoms and was asleep during this time. Patient hs HX of AF and will recheck his presenting rhythm 02/23/23.

## 2023-02-23 NOTE — Telephone Encounter (Signed)
No new alerts noted in [employer]. Will continue to monitor.

## 2023-02-26 ENCOUNTER — Encounter: Payer: Medicare Other | Attending: Physical Medicine & Rehabilitation | Admitting: Physical Medicine & Rehabilitation

## 2023-02-26 ENCOUNTER — Encounter: Payer: Self-pay | Admitting: Physical Medicine & Rehabilitation

## 2023-02-26 VITALS — BP 115/77 | HR 70 | Ht 74.0 in | Wt 301.0 lb

## 2023-02-26 DIAGNOSIS — M2550 Pain in unspecified joint: Secondary | ICD-10-CM | POA: Diagnosis not present

## 2023-02-26 DIAGNOSIS — M1A9XX0 Chronic gout, unspecified, without tophus (tophi): Secondary | ICD-10-CM | POA: Diagnosis present

## 2023-02-26 DIAGNOSIS — Z5181 Encounter for therapeutic drug level monitoring: Secondary | ICD-10-CM

## 2023-02-26 DIAGNOSIS — R52 Pain, unspecified: Secondary | ICD-10-CM | POA: Diagnosis present

## 2023-02-26 DIAGNOSIS — Z794 Long term (current) use of insulin: Secondary | ICD-10-CM | POA: Diagnosis present

## 2023-02-26 DIAGNOSIS — E1165 Type 2 diabetes mellitus with hyperglycemia: Secondary | ICD-10-CM | POA: Insufficient documentation

## 2023-02-26 DIAGNOSIS — G894 Chronic pain syndrome: Secondary | ICD-10-CM | POA: Diagnosis present

## 2023-02-26 DIAGNOSIS — G6289 Other specified polyneuropathies: Secondary | ICD-10-CM

## 2023-02-26 MED ORDER — GABAPENTIN 800 MG PO TABS: 800.0000 mg | ORAL_TABLET | Freq: Three times a day (TID) | ORAL | 5 refills | Status: DC

## 2023-02-26 MED ORDER — DULOXETINE HCL 30 MG PO CPEP: 30.0000 mg | ORAL_CAPSULE | Freq: Every day | ORAL | 2 refills | Status: DC

## 2023-02-26 NOTE — Progress Notes (Addendum)
Subjective:    Patient ID: [name], male    DOB: 02-16-1961, 62 y.o.   MRN: [id number]  HPI  [name] is a 62 y.o. year old male  who  has a past medical history of AICD (automatic cardioverter/defibrillator) present, Anxiety, Atrial fibrillation (HCC), CAD (coronary artery disease), Cardiomyopathy (HCC), CHF (congestive heart failure) (HCC) (09/2019), Depression, Diabetes mellitus, Erectile dysfunction (11/2019), GI bleeding, H/O right heart catheterization (09/2019), Hypertension, Presence of permanent cardiac pacemaker, Presence of Watchman left atrial appendage closure device (10/27/2021), Schizophrenia (HCC), and Sleep apnea.   They are presenting to PM&R clinic as a new patient for pain management evaluation. They were referred by Hulda Marin for treatment of polyarthralgia pain.  [name] reports having pain throughout his body for more than 10 years.  Pain was previously managed at a pain doctor with [facility] and patient reports he stopped going there because of a disagreement with the doctor.  Patient was previously taking oxycodone 10 mg about 3 times a day.  Patient reports that the oxycodone has helped the pain more than any other medication.  He reports trying multiple other medications however he does not remember all the names of these.  Patient reports every joint in his body is painful.  He reports having imaging done at [facility] and reports he was told he has arthritis.  He also has a history of gout which she reports previously occurred in his ankles.  Pain is worsened by activity and cold weather.   Pain in multiple jionts 10 years or more. Pain management in past - [facility]  Xrays of lower back and neck  Gabapentin- 800 mg q6h Gout in his ankles Oxycodone- 10mg  helped the pain  Weather and activity worsen it            Red flag symptoms: No red flags for back pain endorsed in Hx or ROS, saddle anesthesia, loss of bowel  or bladder continence, new weakness, new numbness/tingling, and pain waking up at nighttime.   Medications tried: Topical medications no benefit Nsaids - cardiac history- cannot take  Tylenol no benefit Opiates: 10 mg helped improve his pain Gabapentin patient reports taking gabapentin 800 mg every 6 hours for neuropathy in his feet.  Diabetes Lyrica- not sure Hydrocodone- not sure     Other treatments: PT- not much help, tried to walk and go the [facility] TENs unit - has not tried Injections - in his feet    Interval History 02/26/23 Mr. Fester is here for follow-up regarding his chronic back pain and polyarthralgia.  He reports his pain is unchanged from his last visit.[name]  He reports his pain continues to be very severe and makes it very difficult for him to participate in any activities.  He continues to take gabapentin that helps his neuropathy pain in his feet.  He has not tried duloxetine but reports he has not had side effects with antidepressant type medications in the past.   Pain Inventory Average Pain 9  Pain Right Now 10 My pain is intermittent, sharp, and aching  In the last 24 hours, has pain interfered with the following? General activity 9 Relation with others 10 Enjoyment of life 9 What TIME of day is your pain at its worst? morning , daytime, evening, and night Sleep (in general) Fair  Pain is worse with: walking, bending, sitting, standing, and some activites Pain improves with: medication Relief from Meds: 8  Family History  Problem  Relation Age of Onset   Heart failure Mother    Mental illness Sister    Mental illness Sister    Social History   Socioeconomic History   Marital status: Legally Separated    Spouse name: Not on file   Number of children: Not on file   Years of education: Not on file   Highest education level: Not on file  Occupational History   Not on file  Tobacco Use   Smoking status: Never    Passive exposure: Never   Smokeless  tobacco: Never  Vaping Use   Vaping Use: Never used  Substance and Sexual Activity   Alcohol use: Not Currently    Alcohol/week: 2.0 standard drinks of alcohol    Types: 2 Cans of beer per week   Drug use: No   Sexual activity: Yes    Birth control/protection: None  Other Topics Concern   Not on file  Social History Narrative   Not on file   Social Determinants of Health   Financial Resource Strain: Low Risk  (12/23/2019)   Overall Financial Resource Strain (CARDIA)    Difficulty of Paying Living Expenses: Not very hard  Food Insecurity: No Food Insecurity (07/01/2021)   Hunger Vital Sign    Worried About Running Out of Food in the Last Year: Never true    Ran Out of Food in the Last Year: Never true  Transportation Needs: Unmet Transportation Needs (07/01/2021)   PRAPARE - Administrator, Civil Service (Medical): Yes    Lack of Transportation (Non-Medical): Yes  Physical Activity: Not on file  Stress: Not on file  Social Connections: Not on file   Past Surgical History:  Procedure Laterality Date   BIOPSY  04/19/2021   Procedure: BIOPSY;  Surgeon: Sherrilyn Rist, MD;  Location: [facility] ENDOSCOPY;  Service: Gastroenterology;;   COLONOSCOPY WITH PROPOFOL N/A 04/16/2021   Procedure: COLONOSCOPY WITH PROPOFOL;  Surgeon: Tressia Danas, MD;  Location: [facility] ENDOSCOPY;  Service: Gastroenterology;  Laterality: N/A;   CORONARY STENT PLACEMENT     ENTEROSCOPY N/A 04/08/2020   Procedure: ENTEROSCOPY;  Surgeon: Sherrilyn Rist, MD;  Location: [facility] ENDOSCOPY;  Service: Gastroenterology;  Laterality: N/A;   ENTEROSCOPY N/A 06/17/2020   Procedure: ENTEROSCOPY;  Surgeon: Hilarie Fredrickson, MD;  Location: [facility] ENDOSCOPY;  Service: Endoscopy;  Laterality: N/A;   ENTEROSCOPY N/A 04/15/2021   Procedure: ENTEROSCOPY;  Surgeon: Tressia Danas, MD;  Location: [facility] ENDOSCOPY;  Service: Gastroenterology;  Laterality: N/A;   ENTEROSCOPY N/A 04/19/2021   Procedure: ENTEROSCOPY;  Surgeon: Sherrilyn Rist, MD;  Location: [facility] ENDOSCOPY;  Service: Gastroenterology;  Laterality: N/A;   ENTEROSCOPY N/A 07/01/2021   Procedure: ENTEROSCOPY;  Surgeon: Beverley Fiedler, MD;  Location: [facility] ENDOSCOPY;  Service: Gastroenterology;  Laterality: N/A;   GIVENS CAPSULE STUDY  04/16/2021   Procedure: GIVENS CAPSULE STUDY;  Surgeon: Tressia Danas, MD;  Location: [facility] ENDOSCOPY;  Service: Gastroenterology;;   HEMOSTASIS CLIP PLACEMENT  04/08/2020   Procedure: HEMOSTASIS CLIP PLACEMENT;  Surgeon: Sherrilyn Rist, MD;  Location: [facility] ENDOSCOPY;  Service: Gastroenterology;;   HEMOSTASIS CLIP PLACEMENT  07/01/2021   Procedure: HEMOSTASIS CLIP PLACEMENT;  Surgeon: Beverley Fiedler, MD;  Location: [facility] ENDOSCOPY;  Service: Gastroenterology;;   HEMOSTASIS CONTROL  04/08/2020   Procedure: HEMOSTASIS CONTROL;  Surgeon: Sherrilyn Rist, MD;  Location: [facility] ENDOSCOPY;  Service: Gastroenterology;;   HEMOSTASIS CONTROL  06/17/2020   Procedure: HEMOSTASIS CONTROL;  Surgeon: Hilarie Fredrickson,  MD;  Location: [facility] ENDOSCOPY;  Service: Endoscopy;;   HERNIA REPAIR     HOT HEMOSTASIS N/A 04/15/2021   Procedure: HOT HEMOSTASIS (ARGON PLASMA COAGULATION/BICAP);  Surgeon: Tressia Danas, MD;  Location: [facility] ENDOSCOPY;  Service: Gastroenterology;  Laterality: N/A;   HOT HEMOSTASIS N/A 07/01/2021   Procedure: HOT HEMOSTASIS (ARGON PLASMA COAGULATION/BICAP);  Surgeon: Beverley Fiedler, MD;  Location: [facility] ENDOSCOPY;  Service: Gastroenterology;  Laterality: N/A;   ICD IMPLANT N/A 09/03/2020   Procedure: ICD IMPLANT;  Surgeon: Lanier Prude, MD;  Location: [facility] INVASIVE CV LAB;  Service: Cardiovascular;  Laterality: N/A;   LEFT ATRIAL APPENDAGE OCCLUSION N/A 10/27/2021   Procedure: LEFT ATRIAL APPENDAGE OCCLUSION;  Surgeon: Lanier Prude, MD;  Location: [facility] INVASIVE CV LAB;  Service: Cardiovascular;  Laterality: N/A;   POLYPECTOMY  04/16/2021   Procedure: POLYPECTOMY;  Surgeon: Tressia Danas, MD;  Location: [facility] ENDOSCOPY;  Service: Gastroenterology;;    RIGHT/LEFT HEART CATH AND CORONARY ANGIOGRAPHY N/A 10/14/2019   Procedure: RIGHT/LEFT HEART CATH AND CORONARY ANGIOGRAPHY;  Surgeon: Lyn Records, MD;  Location: [facility] INVASIVE CV LAB;  Service: Cardiovascular;  Laterality: N/A;   SUBMUCOSAL TATTOO INJECTION  04/19/2021   Procedure: SUBMUCOSAL TATTOO INJECTION;  Surgeon: Sherrilyn Rist, MD;  Location: [facility] ENDOSCOPY;  Service: Gastroenterology;;   TEE WITHOUT CARDIOVERSION N/A 10/27/2021   Procedure: TRANSESOPHAGEAL ECHOCARDIOGRAM (TEE);  Surgeon: Lanier Prude, MD;  Location: [facility] INVASIVE CV LAB;  Service: Cardiovascular;  Laterality: N/A;   TEE WITHOUT CARDIOVERSION N/A 12/12/2021   Procedure: TRANSESOPHAGEAL ECHOCARDIOGRAM (TEE);  Surgeon: Sande Rives, MD;  Location: [facility] ENDOSCOPY;  Service: Cardiovascular;  Laterality: N/A;   Past Surgical History:  Procedure Laterality Date   BIOPSY  04/19/2021   Procedure: BIOPSY;  Surgeon: Sherrilyn Rist, MD;  Location: [facility] ENDOSCOPY;  Service: Gastroenterology;;   COLONOSCOPY WITH PROPOFOL N/A 04/16/2021   Procedure: COLONOSCOPY WITH PROPOFOL;  Surgeon: Tressia Danas, MD;  Location: [facility] ENDOSCOPY;  Service: Gastroenterology;  Laterality: N/A;   CORONARY STENT PLACEMENT     ENTEROSCOPY N/A 04/08/2020   Procedure: ENTEROSCOPY;  Surgeon: Sherrilyn Rist, MD;  Location: [facility] ENDOSCOPY;  Service: Gastroenterology;  Laterality: N/A;   ENTEROSCOPY N/A 06/17/2020   Procedure: ENTEROSCOPY;  Surgeon: Hilarie Fredrickson, MD;  Location: [facility] ENDOSCOPY;  Service: Endoscopy;  Laterality: N/A;   ENTEROSCOPY N/A 04/15/2021   Procedure: ENTEROSCOPY;  Surgeon: Tressia Danas, MD;  Location: [facility] ENDOSCOPY;  Service: Gastroenterology;  Laterality: N/A;   ENTEROSCOPY N/A 04/19/2021   Procedure: ENTEROSCOPY;  Surgeon: Sherrilyn Rist, MD;  Location: [facility] ENDOSCOPY;  Service: Gastroenterology;  Laterality: N/A;   ENTEROSCOPY N/A 07/01/2021   Procedure: ENTEROSCOPY;  Surgeon: Beverley Fiedler, MD;  Location: [facility]  ENDOSCOPY;  Service: Gastroenterology;  Laterality: N/A;   GIVENS CAPSULE STUDY  04/16/2021   Procedure: GIVENS CAPSULE STUDY;  Surgeon: Tressia Danas, MD;  Location: [facility] ENDOSCOPY;  Service: Gastroenterology;;   HEMOSTASIS CLIP PLACEMENT  04/08/2020   Procedure: HEMOSTASIS CLIP PLACEMENT;  Surgeon: Sherrilyn Rist, MD;  Location: [facility] ENDOSCOPY;  Service: Gastroenterology;;   HEMOSTASIS CLIP PLACEMENT  07/01/2021   Procedure: HEMOSTASIS CLIP PLACEMENT;  Surgeon: Beverley Fiedler, MD;  Location: [facility] ENDOSCOPY;  Service: Gastroenterology;;   HEMOSTASIS CONTROL  04/08/2020   Procedure: HEMOSTASIS CONTROL;  Surgeon: Sherrilyn Rist, MD;  Location: [facility] ENDOSCOPY;  Service: Gastroenterology;;   HEMOSTASIS CONTROL  06/17/2020   Procedure: HEMOSTASIS CONTROL;  Surgeon: Hilarie Fredrickson, MD;  Location: [facility] ENDOSCOPY;  Service: Endoscopy;;   HERNIA REPAIR     HOT HEMOSTASIS N/A 04/15/2021   Procedure: HOT HEMOSTASIS (ARGON PLASMA COAGULATION/BICAP);  Surgeon: Tressia Danas, MD;  Location: [facility] ENDOSCOPY;  Service: Gastroenterology;  Laterality: N/A;   HOT HEMOSTASIS N/A 07/01/2021   Procedure: HOT HEMOSTASIS (ARGON PLASMA COAGULATION/BICAP);  Surgeon: Beverley Fiedler, MD;  Location: [facility] ENDOSCOPY;  Service: Gastroenterology;  Laterality: N/A;   ICD IMPLANT N/A 09/03/2020   Procedure: ICD IMPLANT;  Surgeon: Lanier Prude, MD;  Location: [facility] INVASIVE CV LAB;  Service: Cardiovascular;  Laterality: N/A;   LEFT ATRIAL APPENDAGE OCCLUSION N/A 10/27/2021   Procedure: LEFT ATRIAL APPENDAGE OCCLUSION;  Surgeon: Lanier Prude, MD;  Location: [facility] INVASIVE CV LAB;  Service: Cardiovascular;  Laterality: N/A;   POLYPECTOMY  04/16/2021   Procedure: POLYPECTOMY;  Surgeon: Tressia Danas, MD;  Location: [facility] ENDOSCOPY;  Service: Gastroenterology;;   RIGHT/LEFT HEART CATH AND CORONARY ANGIOGRAPHY N/A 10/14/2019   Procedure: RIGHT/LEFT HEART CATH AND CORONARY ANGIOGRAPHY;  Surgeon: Lyn Records, MD;  Location: [facility] INVASIVE CV  LAB;  Service: Cardiovascular;  Laterality: N/A;   SUBMUCOSAL TATTOO INJECTION  04/19/2021   Procedure: SUBMUCOSAL TATTOO INJECTION;  Surgeon: Sherrilyn Rist, MD;  Location: [facility] ENDOSCOPY;  Service: Gastroenterology;;   TEE WITHOUT CARDIOVERSION N/A 10/27/2021   Procedure: TRANSESOPHAGEAL ECHOCARDIOGRAM (TEE);  Surgeon: Lanier Prude, MD;  Location: [facility] INVASIVE CV LAB;  Service: Cardiovascular;  Laterality: N/A;   TEE WITHOUT CARDIOVERSION N/A 12/12/2021   Procedure: TRANSESOPHAGEAL ECHOCARDIOGRAM (TEE);  Surgeon: Sande Rives, MD;  Location: [facility] ENDOSCOPY;  Service: Cardiovascular;  Laterality: N/A;   Past Medical History:  Diagnosis Date   AICD (automatic cardioverter/defibrillator) present    Anxiety    Atrial fibrillation (HCC)    CAD (coronary artery disease)    Cardiomyopathy (HCC)    CHF (congestive heart failure) (HCC) 09/2019   Depression    Diabetes mellitus    Erectile dysfunction 11/2019   GI bleeding    H/O right heart catheterization 09/2019   Hypertension    Presence of permanent cardiac pacemaker    Presence of Watchman left atrial appendage closure device 10/27/2021   27 mm Watchman Flex Device per Dr. Lalla Brothers   Schizophrenia [facility])    Sleep apnea    uses cpap   Ht 6\' 2"  (1.88 m)   Wt (!) 301 lb (136.5 kg)   BMI 38.65 kg/m   Opioid Risk Score:   Fall Risk Score:  `1  Depression screen [facility] 2/9     02/05/2023   11:16 AM 01/19/2023    1:29 PM 02/02/2022   11:28 AM 11/04/2021   11:12 AM 09/30/2021    1:35 PM 08/03/2021    1:55 PM 04/01/2021    1:16 PM  Depression screen PHQ 2/9  Decreased Interest 0 2 0 0 0 1 0  Down, Depressed, Hopeless 0 0 0 0 0 1 1  PHQ - 2 Score 0 2 0 0 0 2 1  Altered sleeping 0 2    1   Tired, decreased energy 0 1    1   Change in appetite 1 2    0   Feeling bad or failure about yourself  1 1    1    Trouble concentrating  3    1   Moving slowly or fidgety/restless 0 1    1   Suicidal thoughts 0 0    0   PHQ-9 Score 2 12     7  Difficult doing work/chores      Not difficult at all       Review of Systems  Musculoskeletal:  Positive for arthralgias, back pain and neck pain.       B/L legs, hand, arms feet shoulder pain  All other systems reviewed and are negative.      Objective:   Physical Exam   Gen: no distress, normal appearing HEENT: oral mucosa pink and moist, NCAT Cardio: Reg rate Chest: normal effort, normal rate of breathing Abd: soft, non-distended Ext: no edema Psych: A little bit flat, overall pleasant Skin: intact Neuro: Alert and oriented, follows commands, cranial nerves II through XII grossly intact Strength 5- out of 5 in all 4 extremities appears very pain limited Sensation intact light touch in all 4 extremities No ataxia or dysmetria noted Musculoskeletal:  C and L spine paraspinal tenderness, also milder tenderness in T spine paraspinal muscles periscapular muscles, shoulders, elbows, wrist, hips, knees, ankles Slump negative Spurling's caused neck pain Facet loading resulted in pain throughout his back and shoulders No acutely inflamed joints noted today     06/21/23 CT foot ankle tibia-fibula IMPRESSION: 1. Superficial edema without localizing abscess or underlying destructive bone lesion. 2. No visible significant edema in the deep soft tissues or appreciable fascial thickening. There is no soft tissue gas. Lack of overt findings of necrotizing fasciitis on CT however, should not delay surgical exploration if there is strong clinical evidence of the disease. 3. Mild fatty atrophy in the gastrocnemius, with fatty atrophy in the intrinsic plantar foot musculature. 4. Degenerative and enthesopathic changes. 5. Calcifications in the popliteal trifurcation arteries      Assessment & Plan:  Polyarthralgia -Mr. Gilkeson appears to have diffuse tenderness throughout his body, will attempt to get records from his prior pain clinic -Did not have significant imaging  available however lower extremity CT appears to show arthritis of his knee, enthesopathic changes, ankle and foot arthritic changes -Patient reports trying multiple medications and oxycodone and gabapentin at the only medications that have helped  -[facility]-records to be faxed over -New consult for Rheumatology placed -ORT moderate  -Will check UDS and complete opiod agreement, discussed this is not guarantee opiod medications will be ordered  -Duloxetine ordered 30mg  daily   Gout -Patient uses colchicine for gout flares   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

## 2023-02-27 ENCOUNTER — Other Ambulatory Visit: Payer: Medicare Other | Admitting: Pharmacist

## 2023-03-01 ENCOUNTER — Other Ambulatory Visit: Payer: Medicare Other | Admitting: Pharmacist

## 2023-03-01 LAB — TOXASSURE SELECT,+ANTIDEPR,UR

## 2023-03-02 ENCOUNTER — Ambulatory Visit (INDEPENDENT_AMBULATORY_CARE_PROVIDER_SITE_OTHER): Payer: Medicare Other

## 2023-03-02 DIAGNOSIS — I5032 Chronic diastolic (congestive) heart failure: Secondary | ICD-10-CM | POA: Diagnosis not present

## 2023-03-03 LAB — CUP PACEART REMOTE DEVICE CHECK
Battery Voltage: 3.1 V
Brady Statistic RV Percent Paced: 0 %
Date Time Interrogation Session: 20240405074525
HighPow Impedance: 75 Ohm
Implantable Lead Connection Status: 753985
Implantable Lead Implant Date: 20211008
Implantable Lead Location: 753860
Implantable Lead Model: 436910
Implantable Lead Serial Number: 81404997
Implantable Pulse Generator Implant Date: 20211008
Lead Channel Impedance Value: 442 Ohm
Lead Channel Pacing Threshold Amplitude: 0.4 V
Lead Channel Pacing Threshold Pulse Width: 0.4 ms
Lead Channel Sensing Intrinsic Amplitude: 1.1 mV
Lead Channel Sensing Intrinsic Amplitude: 16.6 mV
Lead Channel Setting Pacing Amplitude: 2 V
Lead Channel Setting Pacing Pulse Width: 0.4 ms
Lead Channel Setting Sensing Sensitivity: 0.8 mV
Pulse Gen Model: 429525
Pulse Gen Serial Number: 84810752
Zone Setting Status: 755011

## 2023-03-06 ENCOUNTER — Other Ambulatory Visit (HOSPITAL_COMMUNITY): Payer: Self-pay

## 2023-03-06 NOTE — Progress Notes (Signed)
Paramedicine Encounter    Patient ID: [name], male    DOB: 1961-08-11, 62 y.o.   MRN: [id number]   Complaints- foot pain, fatigue   Assessment- CAOX4, warm and dry seated at the kitchen table, reporting foot pain with fatigue. He says both of his feet (on the bottoms) hurt. He states that he has not yet started his Cymbalta.He also states his sugar got up to 480 the other day but admits to eating lots of sugary items and high carb meals. I obtained vitals and assessment. No lower leg swelling, feet tender to touch when ambulating, lungs clear.    Compliance with meds- missed 4 nighttime doses, two morning doses and several insulin doses.   Pill box filled- two weeks   Refills needed-  -Freestyle Sensor -Pantoprazole  -Spiro -Lasix    Meds changes since last visit- added Duloxetine 30mg  daily     Social changes- none   Arrived for home visit for [facility] where he was CAOX4, warm and dry seated at the kitchen table, reporting foot pain with fatigue. He says both of his feet (on the bottoms) hurt. He states that he has not yet started his Cymbalta.He also states his sugar got up to 480 the other day but admits to eating lots of sugary items and high carb meals. I educated him on diabetic management with diet and med management education. He was understanding and agreeable. I obtained vitals and assessment. No lower leg swelling, feet tender to touch when ambulating, lungs clear. I reviewed all meds and filled pill box for two weeks. Refills as noted. We reviewed upcoming appointments. He agreed with plan. I will see him in two weeks and he agreed, he knows to reach out in the mean time and to improve on diet and med management.    BP 110/80   Pulse 79   Resp 18   Wt (!) 301 lb (136.5 kg)   SpO2 95%   BMI 38.65 kg/m  Weight yesterday-- didn't weigh Last visit weight-301lbs   [name], EMT-Paramedic [phone]  ACTION: Home visit completed    Patient Care  Team: Ivonne Andrew, NP as PCP - General (Pulmonary Disease) Quintella Reichert, MD as PCP - Cardiology (Cardiology) Laurey Morale, MD as PCP - Advanced Heart Failure (Cardiology) Lanier Prude, MD as PCP - Electrophysiology (Cardiology) Burna Sis, LCSW as Social Worker (Licensed Clinical Social Worker) Tressia Danas, MD as Consulting Physician (Gastroenterology)  Patient Active Problem List   Diagnosis Date Noted   Multiple joint pain 12/22/2022   Obesity (BMI 35.0-39.9 without comorbidity) 07/18/2022   Cellulitis 06/22/2022   Cellulitis, leg 06/20/2022   Presence of Watchman left atrial appendage closure device 10/28/2021   Atrial fibrillation 10/27/2021   Melena    Chronic diastolic CHF (congestive heart failure)    GI bleed 06/29/2021   Severe anemia 06/29/2021   Adenomatous polyp of ascending colon    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   Acute blood loss anemia    Angiodysplasia of stomach    Chronic anticoagulation    CHF exacerbation 06/15/2020   AKI (acute kidney injury) 06/15/2020   CKD (chronic kidney disease), stage III 06/15/2020   Anemia due to chronic blood loss    Gastric AVM    Angiodysplasia of duodenum with hemorrhage    Chronic systolic CHF (congestive heart failure) 04/05/2020   Anemia 04/05/2020   PAF (paroxysmal atrial fibrillation) 04/05/2020   OSA (obstructive sleep apnea) 04/05/2020  Syncope and collapse 04/05/2020   Type 2 diabetes mellitus with hyperglycemia, with long-term current use of insulin 12/03/2019   Diabetic polyneuropathy associated with type 2 diabetes mellitus 12/03/2019   Erectile dysfunction 12/03/2019   Paranoid schizophrenia, chronic condition (HCC) 01/26/2015   Severe recurrent major depressive disorder with psychotic features (HCC) 01/26/2015   GAD (generalized anxiety disorder) 01/26/2015   OCD (obsessive compulsive disorder) 01/26/2015   Panic disorder without agoraphobia  01/26/2015   Insomnia 01/26/2015    Current Outpatient Medications:    acetaminophen (TYLENOL) 500 MG tablet, Take 2 tablets (1,000 mg total) by mouth 2 (two) times daily as needed., Disp: 180 tablet, Rfl: 3   albuterol (VENTOLIN HFA) 108 (90 Base) MCG/ACT inhaler, Inhale 2 puffs into the lungs every 6 (six) hours as needed for wheezing or shortness of breath., Disp: 18 g, Rfl: 1   aspirin EC 81 MG tablet, Take 1 tablet (81 mg total) by mouth daily. Swallow whole., Disp: 90 tablet, Rfl: 3   atorvastatin (LIPITOR) 40 MG tablet, Take 1 tablet (40 mg total) by mouth daily., Disp: 90 tablet, Rfl: 3   Continuous Blood Gluc Receiver (FREESTYLE LIBRE 14 DAY READER) DEVI, 1 each by Does not apply route as needed., Disp: 1 each, Rfl: 11   Continuous Blood Gluc Sensor (FREESTYLE LIBRE 14 DAY SENSOR) MISC, USE AS DIRECTED EVERY  14  DAYS, Disp: 2 each, Rfl: 0   diclofenac Sodium (VOLTAREN) 1 % GEL, Apply 2 g topically daily as needed for pain., Disp: , Rfl:    DULoxetine (CYMBALTA) 30 MG capsule, Take 1 capsule (30 mg total) by mouth daily., Disp: 30 capsule, Rfl: 2   empagliflozin (JARDIANCE) 10 MG TABS tablet, TAKE 1 TABLET BY MOUTH ONCE DAILY . APPOINTMENT REQUIRED FOR FUTURE REFILLS, Disp: 60 tablet, Rfl: 0   ferrous sulfate 325 (65 FE) MG tablet, Take 1 tablet (325 mg total) by mouth every other day. (Patient taking differently: Take 325 mg by mouth daily with breakfast.), Disp: 30 tablet, Rfl: 1   furosemide (LASIX) 40 MG tablet, Take 1.5 tablets (60 mg total) by mouth 2 (two) times daily., Disp: 90 tablet, Rfl: 11   gabapentin (NEURONTIN) 800 MG tablet, Take 1 tablet (800 mg total) by mouth 3 (three) times daily., Disp: 90 tablet, Rfl: 5   glipiZIDE (GLUCOTROL) 5 MG tablet, Take 1 tablet (5 mg total) by mouth 2 (two) times daily before a meal., Disp: 60 tablet, Rfl: 3   HUMALOG MIX 75/25 KWIKPEN (75-25) 100 UNIT/ML KwikPen, DIAL AND INJECT 30 UNITS UNDER THE SKIN TWICE DAILY, Disp: 75 mL, Rfl: 0    metFORMIN (GLUCOPHAGE-XR) 500 MG 24 hr tablet, Take 1 tablet (500 mg total) by mouth 2 (two) times daily with a meal., Disp: 60 tablet, Rfl: 2   metoprolol succinate (TOPROL-XL) 50 MG 24 hr tablet, Take 1 tablet (50 mg total) by mouth in the morning and at bedtime. Take with or immediately following a meal., Disp: 180 tablet, Rfl: 2   mupirocin ointment (BACTROBAN) 2 %, Apply 1 Application topically 2 (two) times daily., Disp: 22 g, Rfl: 0   pantoprazole (PROTONIX) 40 MG tablet, Take 1 tablet by mouth once daily, Disp: 90 tablet, Rfl: 0   potassium chloride SA (KLOR-CON M) 20 MEQ tablet, Take 2 tablets (40 mEq total) by mouth daily., Disp: 60 tablet, Rfl: 6   QUEtiapine (SEROQUEL) 300 MG tablet, Take 1.5 tablets (450 mg total) by mouth at bedtime., Disp: 135 tablet, Rfl: 1   RELION  PEN NEEDLES 31G X 6 MM MISC, INJECT 1 PEN INTO THE SKIN 2 TIMES DAILY, Disp: 100 each, Rfl: 0   sacubitril-valsartan (ENTRESTO) 24-26 MG, Take 1 tablet by mouth 2 (two) times daily., Disp: 180 tablet, Rfl: 3   Semaglutide, 2 MG/DOSE, (OZEMPIC, 2 MG/DOSE,) 8 MG/3ML SOPN, Inject 2 mg into the skin once a week. Monday, Disp: , Rfl:    sildenafil (VIAGRA) 100 MG tablet, Take 0.5-1 tablets (50-100 mg total) by mouth daily as needed for erectile dysfunction., Disp: 5 tablet, Rfl: 11   spironolactone (ALDACTONE) 25 MG tablet, Take 1 tablet (25 mg total) by mouth daily., Disp: 90 tablet, Rfl: 3   blood glucose meter kit and supplies KIT, Dispense based on patient and insurance preference. Use up to four times daily as directed. (FOR ICD-9 250.00, 250.01). (Patient not taking: Reported on 03/06/2023), Disp: 1 each, Rfl: 0   colchicine 0.6 MG tablet, Daily for 7 days then daily PRN for gout flare (Patient not taking: Reported on 03/06/2023), Disp: 30 tablet, Rfl: 0   cyclobenzaprine (FLEXERIL) 10 MG tablet, Take 10 mg by mouth 3 (three) times daily as needed for muscle spasms. (Patient not taking: Reported on 03/06/2023), Disp: , Rfl:     metolazone (ZAROXOLYN) 2.5 MG tablet, Take 1 tablet (2.5 mg total) by mouth as directed. (Patient not taking: Reported on 01/29/2023), Disp: 5 tablet, Rfl: 2   Multiple Vitamins-Minerals (CENTRUM SILVER 50+MEN) TABS, Take 1 tablet by mouth daily., Disp: , Rfl:    Oxycodone HCl 10 MG TABS, Take 10 mg by mouth 3 (three) times daily. (Patient not taking: Reported on 03/06/2023), Disp: , Rfl:    zinc gluconate 50 MG tablet, Take 50 mg by mouth daily. (Patient not taking: Reported on 03/06/2023), Disp: , Rfl:  No Known Allergies   Social History   Socioeconomic History   Marital status: Legally Separated    Spouse name: Not on file   Number of children: Not on file   Years of education: Not on file   Highest education level: Not on file  Occupational History   Not on file  Tobacco Use   Smoking status: Never    Passive exposure: Never   Smokeless tobacco: Never  Vaping Use   Vaping Use: Never used  Substance and Sexual Activity   Alcohol use: Not Currently    Alcohol/week: 2.0 standard drinks of alcohol    Types: 2 Cans of beer per week   Drug use: No   Sexual activity: Yes    Birth control/protection: None  Other Topics Concern   Not on file  Social History Narrative   Not on file   Social Determinants of Health   Financial Resource Strain: Low Risk  (12/23/2019)   Overall Financial Resource Strain (CARDIA)    Difficulty of Paying Living Expenses: Not very hard  Food Insecurity: No Food Insecurity (07/01/2021)   Hunger Vital Sign    Worried About Running Out of Food in the Last Year: Never true    Ran Out of Food in the Last Year: Never true  Transportation Needs: Unmet Transportation Needs (07/01/2021)   PRAPARE - Administrator, Civil Service (Medical): Yes    Lack of Transportation (Non-Medical): Yes  Physical Activity: Not on file  Stress: Not on file  Social Connections: Not on file  Intimate Partner Violence: Not on file    Physical Exam      Future  Appointments  Date Time Provider Department Center  03/19/2023  3:00 PM Nilda Simmer T, RPH-CPP [facility] None  03/26/2023  2:00 PM Fanny Dance, MD [facility] CPR  05/08/2023  8:00 AM Fuller Plan, MD [facility] None  05/14/2023 11:00 AM Ivonne Andrew, NP [facility] None  06/01/2023  7:40 AM [facility] DEVICE REMOTES [facility]

## 2023-03-07 ENCOUNTER — Telehealth: Payer: Self-pay | Admitting: *Deleted

## 2023-03-07 NOTE — Telephone Encounter (Signed)
Urine drug screen has no drugs present.

## 2023-03-13 ENCOUNTER — Other Ambulatory Visit (HOSPITAL_COMMUNITY): Payer: Self-pay

## 2023-03-15 ENCOUNTER — Other Ambulatory Visit (HOSPITAL_COMMUNITY): Payer: Self-pay

## 2023-03-15 MED ORDER — SPIRONOLACTONE 25 MG PO TABS: 25.0000 mg | ORAL_TABLET | Freq: Every day | ORAL | 3 refills | Status: DC

## 2023-03-15 MED ORDER — FUROSEMIDE 40 MG PO TABS: 60.0000 mg | ORAL_TABLET | Freq: Two times a day (BID) | ORAL | 5 refills | Status: DC

## 2023-03-19 ENCOUNTER — Other Ambulatory Visit (HOSPITAL_COMMUNITY): Payer: Self-pay

## 2023-03-19 ENCOUNTER — Other Ambulatory Visit: Payer: Medicare Other | Admitting: Pharmacist

## 2023-03-19 DIAGNOSIS — E1165 Type 2 diabetes mellitus with hyperglycemia: Secondary | ICD-10-CM

## 2023-03-19 MED ORDER — FREESTYLE LIBRE 3 SENSOR MISC: 2 refills | Status: DC

## 2023-03-19 MED ORDER — METFORMIN HCL ER 500 MG PO TB24: 500.0000 mg | ORAL_TABLET | Freq: Two times a day (BID) | ORAL | 1 refills | Status: DC

## 2023-03-19 NOTE — Progress Notes (Signed)
Paramedicine Encounter   Arrived for home visit to review for med review- meds reviewed and confirmed and pill box filled. Glipizide stopped today per Nilda Simmer T, RPH-CPP. Appointments reviewed and confirmed. Home visit complete.    Patient ID: [name], male    DOB: 29-Nov-1960, 62 y.o.   MRN: [id number]    [name], EMT-Paramedic [phone]  ACTION: Home visit completed    Patient Care Team: Ivonne Andrew, NP as PCP - General (Pulmonary Disease) Quintella Reichert, MD as PCP - Cardiology (Cardiology) Laurey Morale, MD as PCP - Advanced Heart Failure (Cardiology) Lanier Prude, MD as PCP - Electrophysiology (Cardiology) Burna Sis, LCSW as Social Worker (Licensed Clinical Social Worker) Tressia Danas, MD as Consulting Physician (Gastroenterology)  Patient Active Problem List   Diagnosis Date Noted   Multiple joint pain 12/22/2022   Obesity (BMI 35.0-39.9 without comorbidity) 07/18/2022   Cellulitis 06/22/2022   Cellulitis, leg 06/20/2022   Presence of Watchman left atrial appendage closure device 10/28/2021   Atrial fibrillation 10/27/2021   Melena    Chronic diastolic CHF (congestive heart failure)    GI bleed 06/29/2021   Severe anemia 06/29/2021   Adenomatous polyp of ascending colon    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   Acute blood loss anemia    Angiodysplasia of stomach    Chronic anticoagulation    CHF exacerbation 06/15/2020   AKI (acute kidney injury) 06/15/2020   CKD (chronic kidney disease), stage III 06/15/2020   Anemia due to chronic blood loss    Gastric AVM    Angiodysplasia of duodenum with hemorrhage    Chronic systolic CHF (congestive heart failure) 04/05/2020   Anemia 04/05/2020   PAF (paroxysmal atrial fibrillation) 04/05/2020   OSA (obstructive sleep apnea) 04/05/2020   Syncope and collapse 04/05/2020   Type 2 diabetes mellitus with hyperglycemia, with long-term current  use of insulin 12/03/2019   Diabetic polyneuropathy associated with type 2 diabetes mellitus 12/03/2019   Erectile dysfunction 12/03/2019   Paranoid schizophrenia, chronic condition (HCC) 01/26/2015   Severe recurrent major depressive disorder with psychotic features (HCC) 01/26/2015   GAD (generalized anxiety disorder) 01/26/2015   OCD (obsessive compulsive disorder) 01/26/2015   Panic disorder without agoraphobia 01/26/2015   Insomnia 01/26/2015    Current Outpatient Medications:    acetaminophen (TYLENOL) 500 MG tablet, Take 2 tablets (1,000 mg total) by mouth 2 (two) times daily as needed., Disp: 180 tablet, Rfl: 3   albuterol (VENTOLIN HFA) 108 (90 Base) MCG/ACT inhaler, Inhale 2 puffs into the lungs every 6 (six) hours as needed for wheezing or shortness of breath., Disp: 18 g, Rfl: 1   aspirin EC 81 MG tablet, Take 1 tablet (81 mg total) by mouth daily. Swallow whole., Disp: 90 tablet, Rfl: 3   atorvastatin (LIPITOR) 40 MG tablet, Take 1 tablet (40 mg total) by mouth daily., Disp: 90 tablet, Rfl: 3   blood glucose meter kit and supplies KIT, Dispense based on patient and insurance preference. Use up to four times daily as directed. (FOR ICD-9 250.00, 250.01)., Disp: 1 each, Rfl: 0   colchicine 0.6 MG tablet, Daily for 7 days then daily PRN for gout flare, Disp: 30 tablet, Rfl: 0   Continuous Glucose Sensor (FREESTYLE LIBRE 3 SENSOR) MISC, Place 1 sensor on the skin every 14 days. Use to check glucose continuously, Disp: 6 each, Rfl: 2   cyclobenzaprine (FLEXERIL) 10 MG tablet, Take 10 mg by mouth 3 (three) times daily as  needed for muscle spasms., Disp: , Rfl:    diclofenac Sodium (VOLTAREN) 1 % GEL, Apply 2 g topically daily as needed for pain., Disp: , Rfl:    DULoxetine (CYMBALTA) 30 MG capsule, Take 1 capsule (30 mg total) by mouth daily., Disp: 30 capsule, Rfl: 2   empagliflozin (JARDIANCE) 10 MG TABS tablet, TAKE 1 TABLET BY MOUTH ONCE DAILY . APPOINTMENT REQUIRED FOR FUTURE  REFILLS, Disp: 60 tablet, Rfl: 0   ferrous sulfate 325 (65 FE) MG tablet, Take 1 tablet (325 mg total) by mouth every other day. (Patient taking differently: Take 325 mg by mouth daily with breakfast.), Disp: 30 tablet, Rfl: 1   furosemide (LASIX) 40 MG tablet, Take 1.5 tablets (60 mg total) by mouth 2 (two) times daily., Disp: 200 tablet, Rfl: 5   gabapentin (NEURONTIN) 800 MG tablet, Take 1 tablet (800 mg total) by mouth 3 (three) times daily., Disp: 90 tablet, Rfl: 5   HUMALOG MIX 75/25 KWIKPEN (75-25) 100 UNIT/ML KwikPen, DIAL AND INJECT 30 UNITS UNDER THE SKIN TWICE DAILY, Disp: 75 mL, Rfl: 0   metFORMIN (GLUCOPHAGE-XR) 500 MG 24 hr tablet, Take 1 tablet (500 mg total) by mouth 2 (two) times daily with a meal., Disp: 180 tablet, Rfl: 1   metoprolol succinate (TOPROL-XL) 50 MG 24 hr tablet, Take 1 tablet (50 mg total) by mouth in the morning and at bedtime. Take with or immediately following a meal., Disp: 180 tablet, Rfl: 2   Multiple Vitamins-Minerals (CENTRUM SILVER 50+MEN) TABS, Take 1 tablet by mouth daily., Disp: , Rfl:    mupirocin ointment (BACTROBAN) 2 %, Apply 1 Application topically 2 (two) times daily., Disp: 22 g, Rfl: 0   Oxycodone HCl 10 MG TABS, Take 10 mg by mouth 3 (three) times daily., Disp: , Rfl:    pantoprazole (PROTONIX) 40 MG tablet, Take 1 tablet by mouth once daily, Disp: 90 tablet, Rfl: 0   potassium chloride SA (KLOR-CON M) 20 MEQ tablet, Take 2 tablets (40 mEq total) by mouth daily., Disp: 60 tablet, Rfl: 6   QUEtiapine (SEROQUEL) 300 MG tablet, Take 1.5 tablets (450 mg total) by mouth at bedtime., Disp: 135 tablet, Rfl: 1   RELION PEN NEEDLES 31G X 6 MM MISC, INJECT 1 PEN INTO THE SKIN 2 TIMES DAILY, Disp: 100 each, Rfl: 0   sacubitril-valsartan (ENTRESTO) 24-26 MG, Take 1 tablet by mouth 2 (two) times daily., Disp: 180 tablet, Rfl: 3   Semaglutide, 2 MG/DOSE, (OZEMPIC, 2 MG/DOSE,) 8 MG/3ML SOPN, Inject 2 mg into the skin once a week. Monday, Disp: , Rfl:     sildenafil (VIAGRA) 100 MG tablet, Take 0.5-1 tablets (50-100 mg total) by mouth daily as needed for erectile dysfunction., Disp: 5 tablet, Rfl: 11   spironolactone (ALDACTONE) 25 MG tablet, Take 1 tablet (25 mg total) by mouth daily., Disp: 90 tablet, Rfl: 3   zinc gluconate 50 MG tablet, Take 50 mg by mouth daily., Disp: , Rfl:    metolazone (ZAROXOLYN) 2.5 MG tablet, Take 1 tablet (2.5 mg total) by mouth as directed. (Patient not taking: Reported on 01/29/2023), Disp: 5 tablet, Rfl: 2 No Known Allergies   Social History   Socioeconomic History   Marital status: Legally Separated    Spouse name: Not on file   Number of children: Not on file   Years of education: Not on file   Highest education level: Not on file  Occupational History   Not on file  Tobacco Use   Smoking status:  Never    Passive exposure: Never   Smokeless tobacco: Never  Vaping Use   Vaping Use: Never used  Substance and Sexual Activity   Alcohol use: Not Currently    Alcohol/week: 2.0 standard drinks of alcohol    Types: 2 Cans of beer per week   Drug use: No   Sexual activity: Yes    Birth control/protection: None  Other Topics Concern   Not on file  Social History Narrative   Not on file   Social Determinants of Health   Financial Resource Strain: Medium Risk (03/19/2023)   Overall Financial Resource Strain (CARDIA)    Difficulty of Paying Living Expenses: Somewhat hard  Food Insecurity: No Food Insecurity (07/01/2021)   Hunger Vital Sign    Worried About Running Out of Food in the Last Year: Never true    Ran Out of Food in the Last Year: Never true  Transportation Needs: Unmet Transportation Needs (07/01/2021)   PRAPARE - Administrator, Civil Service (Medical): Yes    Lack of Transportation (Non-Medical): Yes  Physical Activity: Not on file  Stress: Not on file  Social Connections: Not on file  Intimate Partner Violence: Not on file    Physical Exam      Future Appointments   Date Time Provider Department Center  03/26/2023  2:00 PM Fanny Dance, MD CPR[facility] CPR  04/16/2023  3:30 PM Nilda Simmer T, RPH-CPP [facility] None  05/08/2023  8:00 AM Fuller Plan, MD [facility] None  05/14/2023 11:00 AM Ivonne Andrew, NP [facility] None  06/01/2023  7:40 AM [facility] DEVICE REMOTES [facility]

## 2023-03-19 NOTE — Patient Instructions (Signed)
Pravin,   Let me know if there are any issues picking up the [place] 3. Download the [place] 3 app on your phone.   Stop glipizide. Continue Ozempic 2 mg weekly, metformin XR 500 mg twice daily, Jardiance 10 mg daily, and Humalog 75/25 30 units twice daily. Please reach out with any continued low blood sugars.  I'll talk to you next month.   Thanks!  Catie Eppie Gibson, PharmD, BCACP, CPP [facility] [phone]

## 2023-03-19 NOTE — Progress Notes (Signed)
03/19/2023 Name: [name] MRN: [id number] DOB: 1961/08/19  Chief Complaint  Patient presents with   Medication Management   Diabetes    [name] is a 62 y.o. year old male who presented for a telephone visit.   They were referred to the pharmacist by their PCP for assistance in managing diabetes.    Subjective:  Care Team: Primary Care Provider: Ivonne Andrew, NP ; Next Scheduled Visit: 05/14/23  Medication Access/Adherence  Current Pharmacy:  [facility] [address], [place] - [address] [place] [address] Phone: [phone] Fax: [phone]  [name] - [facility] [address] [place] [address] Phone: [phone] Fax: [phone]  [facility] - [place], [place] - [address] [place] [address] Phone: [phone] Fax: [phone]   Patient reports affordability concerns with their medications: No  Patient reports access/transportation concerns to their pharmacy: No  Patient reports adherence concerns with their medications:  No     Diabetes:  Current medications: metformin 500 mg twice daily, Ozempic 2 mg weekly (x3 weeks); Jardiance 10 mg daily, glipizide 5 mg twice daily; Humalog 75/25 mg 30 units twice daily   Currently using Libre 14 day CGM; interested in upgrading to [place] 3 CGM.   Patient reports hypoglycemic s/sx including dizziness, shakiness, sweating. Notes periodic readings in the 50-60s; reports a reading of 72 and dropping while on the phone with me today. We ended our call early so he could eat lunch.   Novo Nordisk assistance for [name]  Hyperlipidemia/ASCVD Risk Reduction  Current lipid lowering medications: atorvastatin 40 mg daily  Antiplatelet regimen: aspirin 81 mg daily   Heart Failure:  Current medications:  ACEi/ARB/ARNI: Entresto 24/26 mg twice  daily SGLT2i: Jardiance 10 mg daily Beta blocker: metoprolol succinate 50 mg daily Mineralocorticoid Receptor Antagonist: spironolactone 25 mg daily Diuretic regimen: furosemide 60 mg daily, potassium 40 mEq daily   Visit today with Paramedicine.   Asks today about bariatric surgery.   Contacted [facility]. [name] was $0 in March - anticipate he has been using [name].   Objective:  Lab Results  Component Value Date   HGBA1C 7.5 (A) 02/05/2023    Lab Results  Component Value Date   CREATININE 1.18 02/08/2023   BUN 13 02/08/2023   NA 139 02/08/2023   K 4.6 02/08/2023   CL 101 02/08/2023   CO2 27 02/08/2023    Lab Results  Component Value Date   CHOL 114 01/29/2023   HDL 34 (Long) 01/29/2023   LDLCALC 58 01/29/2023   TRIG 111 01/29/2023   CHOLHDL 3.4 01/29/2023    Medications Reviewed Today     Reviewed by [name], Paramedic (Certified Medical Assistant) on 03/06/23 at 1654  Med List Status: <None>   Medication Order Taking? Sig Documenting Provider Last Dose Status Informant  acetaminophen (TYLENOL) 500 MG tablet [id number] Yes Take 2 tablets (1,000 mg total) by mouth 2 (two) times daily as needed. Joel Chapel, MD Taking Active Self, Pharmacy Records  albuterol (VENTOLIN HFA) 108 (90 Base) MCG/ACT inhaler [id number] Yes Inhale 2 puffs into the lungs every 6 (six) hours as needed for wheezing or shortness of breath. Joel Merino, NP Taking Active Self, Pharmacy Records           Med Note [name], [name]   Tue Mar 06, 2023  4:53 PM)  aspirin EC 81 MG tablet [id number] Yes Take 1 tablet (81 mg total) by mouth daily. Swallow whole. Joel Hora, PA-C Taking Active Self, Pharmacy Records  atorvastatin (LIPITOR) 40 MG tablet [id number] Yes Take 1 tablet (40 mg total) by mouth daily. Joel Morale, MD Taking Active   blood glucose meter kit and supplies KIT [id number] No Dispense based on patient and insurance  preference. Use up to four times daily as directed. (FOR ICD-9 250.00, 250.01).  Patient not taking: Reported on 03/06/2023   Joel Carrow, MD Not Taking Active Self, Pharmacy Records  colchicine 0.6 MG tablet [id number] No Daily for 7 days then daily PRN for gout flare  Patient not taking: Reported on 03/06/2023   Joel Andrew, NP Not Taking Active   Continuous Blood Gluc Receiver (FREESTYLE LIBRE 14 DAY READER) DEVI [id number] Yes 1 each by Does not apply route as needed. Joel Locks, FNP Taking Active Self, Pharmacy Records  Continuous Blood Gluc Sensor (FREESTYLE LIBRE 14 [place]) [place] [id number] Yes USE AS DIRECTED EVERY  14  DAYS Joel Andrew, NP Taking Active   cyclobenzaprine (FLEXERIL) 10 MG tablet [id number] No Take 10 mg by mouth 3 (three) times daily as needed for muscle spasms.  Patient not taking: Reported on 03/06/2023   [provider] Not Taking Active Self, Pharmacy Records  diclofenac Sodium (VOLTAREN) 1 % GEL [id number] Yes Apply 2 g topically daily as needed for pain. [provider] Taking Active Self, Pharmacy Records  DULoxetine (CYMBALTA) 30 MG capsule [id number] Yes Take 1 capsule (30 mg total) by mouth daily. Joel Dance, MD Taking Active   empagliflozin (JARDIANCE) 10 MG TABS tablet [id number] Yes TAKE 1 TABLET BY MOUTH ONCE DAILY . APPOINTMENT REQUIRED FOR FUTURE REFILLS Joel Morale, MD Taking Active   ferrous sulfate 325 (65 FE) MG tablet [id number] Yes Take 1 tablet (325 mg total) by mouth every other day.  Patient taking differently: Take 325 mg by mouth daily with breakfast.   Joel Merino, NP Taking Active Self, Pharmacy Records           Med Note [name], Clyde Lundborg Nov 06, 2022  5:48 PM)    furosemide (LASIX) 40 MG tablet [id number] Yes Take 1.5 tablets (60 mg total) by mouth 2 (two) times daily. Joel Morale, MD Taking Active   gabapentin (NEURONTIN) 800 MG tablet [id number] Yes Take 1 tablet (800 mg total) by mouth  3 (three) times daily. Joel Dance, MD Taking Active   glipiZIDE (GLUCOTROL) 5 MG tablet [id number] Yes Take 1 tablet (5 mg total) by mouth 2 (two) times daily before a meal. Joel Andrew, NP Taking Active   HUMALOG MIX 75/25 KWIKPEN (75-25) 100 UNIT/ML KwikPen [id number] Yes DIAL AND INJECT 30 UNITS UNDER THE SKIN TWICE DAILY Joel Andrew, NP Taking Active   metFORMIN (GLUCOPHAGE-XR) 500 MG 24 hr tablet [id number] Yes Take 1 tablet (500 mg total) by mouth 2 (two) times daily with a meal. Joel Andrew, NP Taking Active   metolazone (ZAROXOLYN) 2.5 MG tablet [id number]  Take 1 tablet (2.5 mg total) by mouth as directed.  Patient not taking: Reported on 01/29/2023   Jacklynn Ganong, FNP  Expired 01/23/23 2359            Med Note (SPENCER, [name]   Thu Oct 26, 2022  8:14 AM) One time dose taken on 10/26/22  metoprolol succinate (TOPROL-XL) 50 MG 24 hr tablet [id number]  Yes Take 1 tablet (50 mg total) by mouth in the morning and at bedtime. Take with or immediately following a meal. Sheilah Pigeon, PA-C Taking Active   Multiple Vitamins-Minerals (CENTRUM SILVER 50+MEN) TABS [id number]  Take 1 tablet by mouth daily. [provider]  Active Self, Pharmacy Records  mupirocin ointment (BACTROBAN) 2 % [id number] Yes Apply 1 Application topically 2 (two) times daily. Joel Andrew, NP Taking Active   Oxycodone HCl 10 MG TABS [id number] No Take 10 mg by mouth 3 (three) times daily.  Patient not taking: Reported on 03/06/2023   [provider] Not Taking Active Self, Pharmacy Records           Med Note [name], Clyde Lundborg Jan 29, 2023  1:34 PM)    pantoprazole (PROTONIX) 40 MG tablet [id number] Yes Take 1 tablet by mouth once daily Joel Andrew, NP Taking Active   potassium chloride SA (KLOR-CON M) 20 MEQ tablet [id number] Yes Take 2 tablets (40 mEq total) by mouth daily. [place], Anderson Malta, FNP Taking Active   QUEtiapine (SEROQUEL) 300 MG tablet [id number] Yes Take  1.5 tablets (450 mg total) by mouth at bedtime. Joel Merino, NP Taking Active Self, Pharmacy Records  RELION PEN NEEDLES 31G X 6 MM MISC [id number] Yes INJECT 1 PEN INTO THE SKIN 2 TIMES DAILY Joel Andrew, NP Taking Active   sacubitril-valsartan (ENTRESTO) 24-26 MG [id number] Yes Take 1 tablet by mouth 2 (two) times daily. [place], Anderson Malta, FNP Taking Active Self, Pharmacy Records  Semaglutide, 2 MG/DOSE, (OZEMPIC, 2 MG/DOSE,) 8 MG/3ML SOPN [id number] Yes Inject 2 mg into the skin once a week. Monday [provider] Taking Active Self, Pharmacy Records, Other           Med Note [name], [name]   Thu Oct 26, 2022  8:14 AM)    sildenafil (VIAGRA) 100 MG tablet [id number] Yes Take 0.5-1 tablets (50-100 mg total) by mouth daily as needed for erectile dysfunction. Joel Andrew, NP Taking Active   spironolactone (ALDACTONE) 25 MG tablet [id number] Yes Take 1 tablet (25 mg total) by mouth daily. Joel Morale, MD Taking Active   zinc gluconate 50 MG tablet [id number] No Take 50 mg by mouth daily.  Patient not taking: Reported on 03/06/2023   [provider] Not Taking Active Self  Med List Note [name], [place] 04/01/21 1345):                Assessment/Plan:   Diabetes: - Currently uncontrolled - Reviewed Long term cardiovascular and renal outcomes of uncontrolled blood sugar - Reviewed goal A1c, goal fasting, and goal 2 hour post prandial glucose - Recommend to discontinue glipizide due to hypoglycemia. Discussed with PCP, she is in agreement. Continue Ozempic 2 mg weekly, metformin XR 500 mg twice daily, Jardiance 10 mg daily, Humalog 75/25 30 units twice daily. Patient will pick up Libre 3 CGM and let me know when connected with phone so we can connect to clinic patient portal.  - Recommend to check glucose continuously.  - Moving forward, will re-investigate income and any opportunities for [facility] for greater weight management  benefit.  Hyperlipidemia/ASCVD Risk Reduction: - Currently controlled.  - Recommend to continue current regimen at this time  Heart Failure: - Currently appropriately managed - Recommend to continue current regimen at this time. Will collaborate with HF clinic to update Jardiance to 90 day supply to improve adherence.  - Encouraged patient to discuss  his questions about feasibility of bariatric surgery with HF team.  - Re-enrolled for Cardiomyopathy Fund with [employer] - BIN: [id number], PCN: [id number], Group [id number]; ID: [id number]; 03/21/23-03/19/24   Follow Up Plan: phone call in ~ 4 weeks  Catie Eppie Gibson, PharmD, BCACP, CPP [facility] [phone]

## 2023-03-20 ENCOUNTER — Other Ambulatory Visit (HOSPITAL_COMMUNITY): Payer: Self-pay | Admitting: Pharmacist

## 2023-03-20 MED ORDER — EMPAGLIFLOZIN 10 MG PO TABS: ORAL_TABLET | ORAL | 3 refills | Status: DC

## 2023-03-23 ENCOUNTER — Ambulatory Visit: Payer: Self-pay | Admitting: Nurse Practitioner

## 2023-03-26 ENCOUNTER — Telehealth (HOSPITAL_COMMUNITY): Payer: Self-pay | Admitting: Physical Medicine & Rehabilitation

## 2023-03-26 ENCOUNTER — Encounter: Payer: Medicare Other | Admitting: Physical Medicine & Rehabilitation

## 2023-04-04 ENCOUNTER — Other Ambulatory Visit (HOSPITAL_COMMUNITY): Payer: Self-pay

## 2023-04-04 NOTE — Progress Notes (Signed)
Remote ICD transmission.   

## 2023-04-04 NOTE — Progress Notes (Signed)
Paramedicine Encounter    Patient ID: [name], male    DOB: [birth year], 62 y.o.   MRN: [id number]   Complaints-none  Edema-none  Compliance with meds-somewhat   Pill box filled-yes X 2 wks  If so, by whom-paramedic   Refills needed-cymbalta-- needs in 2nd pill box                           Jardiance--needs in 2nd pill box --co-pay is $130--grant is expired. Sent message to liz to see if she can get him back in with a grant to cover this.    Called pharmacy to place order for these to be refilled   Gabapentin-[id number]--2wks --will call these in next week    Seroquel-[id number] wks    Pt reports he is feeling good. No dizziness, no increased sob, no c/p.  He reports he has missed some of his meds-he left a weeks worth pill box at another location so the week he had with him he was taking every other day to make it last longer.  But he reports he feels good, his weight is actually down from last time heather seen him.  He did ask me my thoughts on tummy tuck-I advised him his cardiologist will have to sign off on that and the surgeon will have to evaluate that it would be a safe procedure for him.  Meds verified and pill boxes refilled.  Will touch base with him next week about the 2 meds that are needed in pill box.   BP (!) 142/84   Pulse 88   Resp 18   Wt 291 lb (132 kg)   SpO2 94%   BMI 37.36 kg/m  Weight yesterday-291 Last visit weight-301  Patient Care Team: Ivonne Andrew, NP as PCP - General (Pulmonary Disease) Quintella Reichert, MD as PCP - Cardiology (Cardiology) Laurey Morale, MD as PCP - Advanced Heart Failure (Cardiology) Lanier Prude, MD as PCP - Electrophysiology (Cardiology) Burna Sis, LCSW as Social Worker (Licensed Clinical Social Worker) Tressia Danas, MD as Consulting Physician (Gastroenterology)  Patient Active Problem List   Diagnosis Date Noted   Multiple joint pain 12/22/2022   Obesity (BMI 35.0-39.9 without  comorbidity) 07/18/2022   Cellulitis 06/22/2022   Cellulitis, leg 06/20/2022   Presence of Watchman left atrial appendage closure device 10/28/2021   Atrial fibrillation (HCC) 10/27/2021   Melena    Chronic diastolic CHF (congestive heart failure) (HCC)    GI bleed 06/29/2021   Severe anemia 06/29/2021   Adenomatous polyp of ascending colon    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   Acute blood loss anemia    Angiodysplasia of stomach    Chronic anticoagulation    CHF exacerbation (HCC) 06/15/2020   AKI (acute kidney injury) (HCC) 06/15/2020   CKD (chronic kidney disease), stage III (HCC) 06/15/2020   Anemia due to chronic blood loss    Gastric AVM    Angiodysplasia of duodenum with hemorrhage    Chronic systolic CHF (congestive heart failure) (HCC) 04/05/2020   Anemia 04/05/2020   PAF (paroxysmal atrial fibrillation) (HCC) 04/05/2020   OSA (obstructive sleep apnea) 04/05/2020   Syncope and collapse 04/05/2020   Type 2 diabetes mellitus with hyperglycemia, with long-term current use of insulin (HCC) 12/03/2019   Diabetic polyneuropathy associated with type 2 diabetes mellitus (HCC) 12/03/2019   Erectile dysfunction 12/03/2019   Paranoid schizophrenia, chronic condition (HCC) 01/26/2015   Severe recurrent major  depressive disorder with psychotic features (HCC) 01/26/2015   GAD (generalized anxiety disorder) 01/26/2015   OCD (obsessive compulsive disorder) 01/26/2015   Panic disorder without agoraphobia 01/26/2015   Insomnia 01/26/2015    Current Outpatient Medications:    acetaminophen (TYLENOL) 500 MG tablet, Take 2 tablets (1,000 mg total) by mouth 2 (two) times daily as needed. (Patient taking differently: Take 1,000 mg by mouth 2 (two) times daily as needed.), Disp: 180 tablet, Rfl: 3   albuterol (PROVENTIL) (2.5 MG/3ML) 0.083% nebulizer solution, Inhale into the lungs., Disp: , Rfl:    aspirin EC 81 MG tablet, Take 1 tablet (81 mg total) by mouth  daily. Swallow whole., Disp: 90 tablet, Rfl: 3   atorvastatin (LIPITOR) 40 MG tablet, Take 1 tablet (40 mg total) by mouth daily. (Patient taking differently: Take 40 mg by mouth at bedtime.), Disp: 90 tablet, Rfl: 3   blood glucose meter kit and supplies KIT, Dispense based on patient and insurance preference. Use up to four times daily as directed. (FOR ICD-9 250.00, 250.01)., Disp: 1 each, Rfl: 0   Continuous Glucose Sensor (FREESTYLE LIBRE 3 SENSOR) MISC, Place 1 sensor on the skin every 14 days. Use to check glucose continuously, Disp: 6 each, Rfl: 2   cyclobenzaprine (FLEXERIL) 10 MG tablet, Take 10 mg by mouth 3 (three) times daily as needed for muscle spasms., Disp: , Rfl:    diclofenac Sodium (VOLTAREN) 1 % GEL, Apply 2 g topically daily as needed for pain., Disp: , Rfl:    DULoxetine (CYMBALTA) 30 MG capsule, Take 1 capsule (30 mg total) by mouth daily., Disp: 30 capsule, Rfl: 2   empagliflozin (JARDIANCE) 10 MG TABS tablet, TAKE 1 TABLET BY MOUTH ONCE DAILY ., Disp: 90 tablet, Rfl: 3   ferrous sulfate 325 (65 FE) MG tablet, Take 1 tablet (325 mg total) by mouth every other day. (Patient taking differently: Take 325 mg by mouth daily with breakfast.), Disp: 30 tablet, Rfl: 1   furosemide (LASIX) 40 MG tablet, Take 1.5 tablets (60 mg total) by mouth 2 (two) times daily., Disp: 200 tablet, Rfl: 5   gabapentin (NEURONTIN) 800 MG tablet, Take 1 tablet (800 mg total) by mouth 3 (three) times daily., Disp: 90 tablet, Rfl: 5   glipiZIDE (GLUCOTROL) 5 MG tablet, Take 5 mg by mouth 2 (two) times daily before a meal., Disp: , Rfl:    metFORMIN (GLUCOPHAGE-XR) 500 MG 24 hr tablet, Take 1 tablet (500 mg total) by mouth 2 (two) times daily with a meal., Disp: 180 tablet, Rfl: 1   metoprolol succinate (TOPROL-XL) 50 MG 24 hr tablet, Take 1 tablet (50 mg total) by mouth in the morning and at bedtime. Take with or immediately following a meal., Disp: 180 tablet, Rfl: 2   Multiple Vitamins-Minerals (CENTRUM  SILVER 50+MEN) TABS, Take 1 tablet by mouth daily., Disp: , Rfl:    pantoprazole (PROTONIX) 40 MG tablet, Take 1 tablet by mouth once daily, Disp: 90 tablet, Rfl: 0   potassium chloride SA (KLOR-CON M) 20 MEQ tablet, Take 2 tablets (40 mEq total) by mouth daily., Disp: 60 tablet, Rfl: 6   QUEtiapine (SEROQUEL) 300 MG tablet, Take 1.5 tablets (450 mg total) by mouth at bedtime., Disp: 135 tablet, Rfl: 1   sacubitril-valsartan (ENTRESTO) 24-26 MG, Take 1 tablet by mouth 2 (two) times daily., Disp: 180 tablet, Rfl: 3   Semaglutide, 2 MG/DOSE, (OZEMPIC, 2 MG/DOSE,) 8 MG/3ML SOPN, Inject 2 mg into the skin once a week. Monday, Disp: ,  Rfl:    spironolactone (ALDACTONE) 25 MG tablet, Take 1 tablet (25 mg total) by mouth daily., Disp: 90 tablet, Rfl: 3   Vitamin D, Ergocalciferol, (DRISDOL) 1.25 MG (50000 UNIT) CAPS capsule, Take 50,000 Units by mouth once a week., Disp: , Rfl:    albuterol (VENTOLIN HFA) 108 (90 Base) MCG/ACT inhaler, Inhale 2 puffs into the lungs every 6 (six) hours as needed for wheezing or shortness of breath., Disp: 18 g, Rfl: 1   amLODipine (NORVASC) 10 MG tablet, Take by mouth. (Patient not taking: Reported on 04/04/2023), Disp: , Rfl:    atorvastatin (LIPITOR) 20 MG tablet, Take by mouth. (Patient not taking: Reported on 04/04/2023), Disp: , Rfl:    benztropine (COGENTIN) 1 MG tablet, Take by mouth. (Patient not taking: Reported on 04/04/2023), Disp: , Rfl:    colchicine 0.6 MG tablet, Daily for 7 days then daily PRN for gout flare (Patient not taking: Reported on 04/04/2023), Disp: 30 tablet, Rfl: 0   Docusate Sodium (DSS) 100 MG CAPS, Take by mouth. (Patient not taking: Reported on 04/04/2023), Disp: , Rfl:    HUMALOG MIX 75/25 KWIKPEN (75-25) 100 UNIT/ML KwikPen, DIAL AND INJECT 30 UNITS UNDER THE SKIN TWICE DAILY, Disp: 75 mL, Rfl: 0   hydrochlorothiazide (HYDRODIURIL) 25 MG tablet, Take by mouth. (Patient not taking: Reported on 04/04/2023), Disp: , Rfl:    iron polysaccharides (NIFEREX)  150 MG capsule, Take by mouth. (Patient not taking: Reported on 04/04/2023), Disp: , Rfl:    lisinopril (ZESTRIL) 40 MG tablet, Take by mouth. (Patient not taking: Reported on 04/04/2023), Disp: , Rfl:    loratadine (CLARITIN) 10 MG tablet, Take by mouth. (Patient not taking: Reported on 04/04/2023), Disp: , Rfl:    metFORMIN (GLUCOPHAGE) 1000 MG tablet, Take by mouth. (Patient not taking: Reported on 04/04/2023), Disp: , Rfl:    metFORMIN (GLUCOPHAGE) 500 MG tablet, Take by mouth. (Patient not taking: Reported on 04/04/2023), Disp: , Rfl:    metolazone (ZAROXOLYN) 2.5 MG tablet, Take 1 tablet (2.5 mg total) by mouth as directed. (Patient not taking: Reported on 01/29/2023), Disp: 5 tablet, Rfl: 2   mirtazapine (REMERON) 30 MG tablet, Take by mouth. (Patient not taking: Reported on 04/04/2023), Disp: , Rfl:    mupirocin ointment (BACTROBAN) 2 %, Apply 1 Application topically 2 (two) times daily., Disp: 22 g, Rfl: 0   Oxycodone HCl 10 MG TABS, Take 10 mg by mouth 3 (three) times daily., Disp: , Rfl:    prazosin (MINIPRESS) 2 MG capsule, Take by mouth. (Patient not taking: Reported on 04/04/2023), Disp: , Rfl:    ranitidine (ZANTAC) 75 MG tablet, Take by mouth. (Patient not taking: Reported on 04/04/2023), Disp: , Rfl:    RELION PEN NEEDLES 31G X 6 MM MISC, INJECT 1 PEN INTO THE SKIN 2 TIMES DAILY, Disp: 100 each, Rfl: 0   sildenafil (VIAGRA) 100 MG tablet, Take 0.5-1 tablets (50-100 mg total) by mouth daily as needed for erectile dysfunction., Disp: 5 tablet, Rfl: 11   tadalafil (CIALIS) 20 MG tablet, Take 20 mg by mouth daily as needed., Disp: , Rfl:    zinc gluconate 50 MG tablet, Take 50 mg by mouth daily. (Patient not taking: Reported on 04/04/2023), Disp: , Rfl:    ziprasidone (GEODON) 80 MG capsule, Take by mouth. (Patient not taking: Reported on 04/04/2023), Disp: , Rfl:  No Known Allergies    Social History   Socioeconomic History   Marital status: Legally Separated    Spouse name: Not on  file   Number of  children: Not on file   Years of education: Not on file   Highest education level: Not on file  Occupational History   Not on file  Tobacco Use   Smoking status: Never    Passive exposure: Never   Smokeless tobacco: Never  Vaping Use   Vaping Use: Never used  Substance and Sexual Activity   Alcohol use: Not Currently    Alcohol/week: 2.0 standard drinks of alcohol    Types: 2 Cans of beer per week   Drug use: No   Sexual activity: Yes    Birth control/protection: None  Other Topics Concern   Not on file  Social History Narrative   Not on file   Social Determinants of Health   Financial Resource Strain: Medium Risk (03/19/2023)   Overall Financial Resource Strain (CARDIA)    Difficulty of Paying Living Expenses: Somewhat hard  Food Insecurity: No Food Insecurity (07/01/2021)   Hunger Vital Sign    Worried About Running Out of Food in the Last Year: Never true    Ran Out of Food in the Last Year: Never true  Transportation Needs: Unmet Transportation Needs (07/01/2021)   PRAPARE - Transportation    Lack of Transportation (Medical): Yes    Lack of Transportation (Non-Medical): Yes  Physical Activity: Not on file  Stress: Not on file  Social Connections: Not on file  Intimate Partner Violence: Not on file    Physical Exam      Future Appointments  Date Time Provider Department Center  04/16/2023  3:30 PM Alden Hipp, RPH-CPP [facility] None  05/08/2023  8:00 AM Fuller Plan, MD [facility] None  05/14/2023 11:00 AM Ivonne Andrew, NP [facility] None  06/01/2023  7:40 AM [facility] DEVICE REMOTES [facility]  09/03/2023  7:40 AM [facility] DEVICE REMOTES [facility]  12/04/2023  7:40 AM [facility] DEVICE REMOTES [facility]  03/05/2024  7:40 AM [facility] DEVICE REMOTES [facility]  06/05/2024  7:40 AM [facility] DEVICE REMOTES [facility]  09/05/2024  7:40 AM [facility] DEVICE REMOTES [facility]       Kerry Hough, Paramedic [phone] [facility] Paramedic  04/05/23

## 2023-04-09 ENCOUNTER — Telehealth (HOSPITAL_COMMUNITY): Payer: Self-pay

## 2023-04-09 NOTE — Telephone Encounter (Signed)
Contacted [facility] pharmacy to relay grant information to fill his jardiance.  [facility] will get med [facility].  He also has cymbalta ready for p/u too.   I called pt to let him know that, he did not answer and mailbox was full.  I did sent text also--unsure if he uses text or not but will try again later.   Kerry Hough, EMT-Paramedic  [phone] 04/09/2023

## 2023-04-11 ENCOUNTER — Telehealth: Payer: Self-pay | Admitting: Nurse Practitioner

## 2023-04-11 ENCOUNTER — Telehealth (HOSPITAL_COMMUNITY): Payer: Self-pay

## 2023-04-11 NOTE — Telephone Encounter (Signed)
I attempted to reach him again about p/u his meds from pharmacy. He did not answer, I LVM for him to call me back.   I called pharmacy to confirm there were still ready for p/u and the cymbalta and jardiance was ready.  I also asked to fill seroquel and gabapentin too. -pharm will get that added to his order as well.    Kerry Hough, EMT-Paramedic  [phone] 04/11/2023

## 2023-04-11 NOTE — Telephone Encounter (Signed)
Called patient to schedule Medicare Annual Wellness Visit (AWV). Left message for patient to call back and schedule Medicare Annual Wellness Visit (AWV).  Last date of AWV: due 05/27/2013 awvi per [employer]  Please schedule an appointment at any time with Abby, NHA. .  If any questions, please contact me at [phone].  Thank you,  Judeth Cornfield,  AMB Clinical Support [facility] AWV Program Direct Dial ??[phone]

## 2023-04-16 ENCOUNTER — Other Ambulatory Visit: Payer: Medicare Other | Admitting: Pharmacist

## 2023-04-16 ENCOUNTER — Encounter: Payer: Self-pay | Admitting: Pharmacist

## 2023-04-16 MED ORDER — FREESTYLE LIBRE 3 READER DEVI: 1.0000 | Freq: Every day | 1 refills | Status: AC

## 2023-04-16 NOTE — Progress Notes (Signed)
Care Coordination Call  Contacted patient for follow up for diabetes. He has not checked any blood sugar readings. He did pick up CGM, but has not started use as his phone is not compatible. He plans to call [employer] and see if he can upgrade to a phone that is compatible.   Sent patient this list of smart phones compatible with [place] 3 sensors. I will also send a script for [facility] reader in case he is unable to switch to a compatible phone.   He will let me know when he has an update.   Catie Eppie Gibson, PharmD, BCACP, CPP [facility] [phone]

## 2023-04-17 ENCOUNTER — Telehealth (HOSPITAL_COMMUNITY): Payer: Self-pay

## 2023-04-17 NOTE — Telephone Encounter (Signed)
Attempted to reach Mr. Grindel for home paramedicine visit scheduling but no answer. I left a voicemail and no return of call as of 1800. I will attempt tomorrow as well.   [name], EMT-Paramedic [phone] 04/17/2023

## 2023-04-18 ENCOUNTER — Telehealth (HOSPITAL_COMMUNITY): Payer: Self-pay

## 2023-04-18 NOTE — Telephone Encounter (Signed)
I attempted to reach Mr. Fairclough to schedule a home paramedicine visit with no success. I will follow up next week.   [name], EMT-Paramedic [phone] 04/18/2023

## 2023-04-19 ENCOUNTER — Telehealth (HOSPITAL_COMMUNITY): Payer: Self-pay | Admitting: Internal Medicine

## 2023-04-19 ENCOUNTER — Other Ambulatory Visit (HOSPITAL_COMMUNITY): Payer: Self-pay

## 2023-04-19 NOTE — Progress Notes (Signed)
Paramedicine Encounter    Patient ID: [name], male    DOB: 12-13-1960, 62 y.o.   MRN: [id number]   Complaints-fatigue, chest pain last week and today   Assessment- CAOX4, cool and clammy, reports he had chest pain last week and a brief episode today during our visit   Compliance with meds- has missed some medications over last two weeks   Pill box filled- for two weeks.   Refills needed- lasix, potassium, seroquel, multivitamin   Meds changes since last visit- none     Social changes- staying with his wife at her and her daughters apartment more often    BP 102/64   Pulse 70   Resp 16   Wt 291 lb (132 kg)   SpO2 96%   BMI 37.36 kg/m  Weight yesterday-- didn't weigh Last visit weight--291lbs    Arrived for home visit for [facility] where he answered the door stating he was asleep and his shirt was soaked from sweat. I asked him how he was feeling and he said fine just tired. I immediately assessed his vitals and they were as noted. Blood sugar was not assessed due to malfunction in meter and he had not yet picked up his new Libre 3 sensor reader. He reports he ate a ham and cheese croissant around 0900. He did take his morning medications. He reports he had a severe episode of chest pain last week and it felt similar to his heart attack- he did not go to the ER for evaluation or call EMS. He did say he had a small episode today as well. I advised him the risks of not doing so. I obtained a 12 lead EKG and noted changes from his previous EKG in March. I called HF clinic and spoke to Tonye Becket, NP and she advised he should be taken to the ER. I tried to urge [name] to go to the ER and he continued to refusing. He reports that if he has pain again he will go. I continued to give him the risks of refusing and the importance of him having a cardiac work up and he continued to refuse. I reviewed all meds and filled pill box for two weeks noting some refills he will need in the next  week. I called those in to [facility] and confirmed they were ready for pick up. He reports he will pick them up and place them in the appropriate spots in his pill box- written instructions of this were left with him also. Home visit complete. I will follow up with [name] early next week via phone and advised him the importance to have him go to the ER if his symptoms persist or worsen. He agreed with plan.    [name], EMT-Paramedic [phone]  ACTION: Home visit completed    Patient Care Team: Ivonne Andrew, NP as PCP - General (Pulmonary Disease) Quintella Reichert, MD as PCP - Cardiology (Cardiology) Laurey Morale, MD as PCP - Advanced Heart Failure (Cardiology) Lanier Prude, MD as PCP - Electrophysiology (Cardiology) Burna Sis, LCSW as Social Worker (Licensed Clinical Social Worker) Tressia Danas, MD as Consulting Physician (Gastroenterology)  Patient Active Problem List   Diagnosis Date Noted   Multiple joint pain 12/22/2022   Obesity (BMI 35.0-39.9 without comorbidity) 07/18/2022   Cellulitis 06/22/2022   Cellulitis, leg 06/20/2022   Presence of Watchman left atrial appendage closure device 10/28/2021   Atrial fibrillation (HCC) 10/27/2021   Melena  Chronic diastolic CHF (congestive heart failure) (HCC)    GI bleed 06/29/2021   Severe anemia 06/29/2021   Adenomatous polyp of ascending colon    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   Acute blood loss anemia    Angiodysplasia of stomach    Chronic anticoagulation    CHF exacerbation (HCC) 06/15/2020   AKI (acute kidney injury) (HCC) 06/15/2020   CKD (chronic kidney disease), stage III (HCC) 06/15/2020   Anemia due to chronic blood loss    Gastric AVM    Angiodysplasia of duodenum with hemorrhage    Chronic systolic CHF (congestive heart failure) (HCC) 04/05/2020   Anemia 04/05/2020   PAF (paroxysmal atrial fibrillation) (HCC) 04/05/2020   OSA (obstructive sleep  apnea) 04/05/2020   Syncope and collapse 04/05/2020   Type 2 diabetes mellitus with hyperglycemia, with long-term current use of insulin (HCC) 12/03/2019   Diabetic polyneuropathy associated with type 2 diabetes mellitus (HCC) 12/03/2019   Erectile dysfunction 12/03/2019   Paranoid schizophrenia, chronic condition (HCC) 01/26/2015   Severe recurrent major depressive disorder with psychotic features (HCC) 01/26/2015   GAD (generalized anxiety disorder) 01/26/2015   OCD (obsessive compulsive disorder) 01/26/2015   Panic disorder without agoraphobia 01/26/2015   Insomnia 01/26/2015    Current Outpatient Medications:    acetaminophen (TYLENOL) 500 MG tablet, Take 2 tablets (1,000 mg total) by mouth 2 (two) times daily as needed. (Patient taking differently: Take 1,000 mg by mouth 2 (two) times daily as needed.), Disp: 180 tablet, Rfl: 3   aspirin EC 81 MG tablet, Take 1 tablet (81 mg total) by mouth daily. Swallow whole., Disp: 90 tablet, Rfl: 3   atorvastatin (LIPITOR) 40 MG tablet, Take 1 tablet (40 mg total) by mouth daily., Disp: 90 tablet, Rfl: 3   blood glucose meter kit and supplies KIT, Dispense based on patient and insurance preference. Use up to four times daily as directed. (FOR ICD-9 250.00, 250.01)., Disp: 1 each, Rfl: 0   Continuous Glucose Receiver (FREESTYLE LIBRE 3 READER) DEVI, 1 each by Does not apply route daily. Use to check glucose continuously, Disp: 1 each, Rfl: 1   Continuous Glucose Sensor (FREESTYLE LIBRE 3 SENSOR) MISC, Place 1 sensor on the skin every 14 days. Use to check glucose continuously, Disp: 6 each, Rfl: 2   diclofenac Sodium (VOLTAREN) 1 % GEL, Apply 2 g topically daily as needed for pain., Disp: , Rfl:    DULoxetine (CYMBALTA) 30 MG capsule, Take 1 capsule (30 mg total) by mouth daily., Disp: 30 capsule, Rfl: 2   empagliflozin (JARDIANCE) 10 MG TABS tablet, TAKE 1 TABLET BY MOUTH ONCE DAILY ., Disp: 90 tablet, Rfl: 3   ferrous sulfate 325 (65 FE) MG tablet,  Take 1 tablet (325 mg total) by mouth every other day. (Patient taking differently: Take 325 mg by mouth daily with breakfast.), Disp: 30 tablet, Rfl: 1   furosemide (LASIX) 40 MG tablet, Take 1.5 tablets (60 mg total) by mouth 2 (two) times daily., Disp: 200 tablet, Rfl: 5   gabapentin (NEURONTIN) 800 MG tablet, Take 1 tablet (800 mg total) by mouth 3 (three) times daily., Disp: 90 tablet, Rfl: 5   HUMALOG MIX 75/25 KWIKPEN (75-25) 100 UNIT/ML KwikPen, DIAL AND INJECT 30 UNITS UNDER THE SKIN TWICE DAILY, Disp: 75 mL, Rfl: 0   metFORMIN (GLUCOPHAGE-XR) 500 MG 24 hr tablet, Take 1 tablet (500 mg total) by mouth 2 (two) times daily with a meal., Disp: 180 tablet, Rfl: 1   metoprolol succinate (TOPROL-XL)  50 MG 24 hr tablet, Take 1 tablet (50 mg total) by mouth in the morning and at bedtime. Take with or immediately following a meal., Disp: 180 tablet, Rfl: 2   Multiple Vitamins-Minerals (CENTRUM SILVER 50+MEN) TABS, Take 1 tablet by mouth daily., Disp: , Rfl:    mupirocin ointment (BACTROBAN) 2 %, Apply 1 Application topically 2 (two) times daily., Disp: 22 g, Rfl: 0   Oxycodone HCl 10 MG TABS, Take 10 mg by mouth 3 (three) times daily., Disp: , Rfl:    pantoprazole (PROTONIX) 40 MG tablet, Take 1 tablet by mouth once daily, Disp: 90 tablet, Rfl: 0   potassium chloride SA (KLOR-CON M) 20 MEQ tablet, Take 2 tablets (40 mEq total) by mouth daily., Disp: 60 tablet, Rfl: 6   QUEtiapine (SEROQUEL) 300 MG tablet, Take 1.5 tablets (450 mg total) by mouth at bedtime., Disp: 135 tablet, Rfl: 1   RELION PEN NEEDLES 31G X 6 MM MISC, INJECT 1 PEN INTO THE SKIN 2 TIMES DAILY, Disp: 100 each, Rfl: 0   sacubitril-valsartan (ENTRESTO) 24-26 MG, Take 1 tablet by mouth 2 (two) times daily., Disp: 180 tablet, Rfl: 3   Semaglutide, 2 MG/DOSE, (OZEMPIC, 2 MG/DOSE,) 8 MG/3ML SOPN, Inject 2 mg into the skin once a week. Monday, Disp: , Rfl:    sildenafil (VIAGRA) 100 MG tablet, Take 0.5-1 tablets (50-100 mg total) by mouth  daily as needed for erectile dysfunction., Disp: 5 tablet, Rfl: 11   spironolactone (ALDACTONE) 25 MG tablet, Take 1 tablet (25 mg total) by mouth daily., Disp: 90 tablet, Rfl: 3   tadalafil (CIALIS) 20 MG tablet, Take 20 mg by mouth daily as needed., Disp: , Rfl:    Vitamin D, Ergocalciferol, (DRISDOL) 1.25 MG (50000 UNIT) CAPS capsule, Take 50,000 Units by mouth once a week., Disp: , Rfl:    albuterol (VENTOLIN HFA) 108 (90 Base) MCG/ACT inhaler, Inhale 2 puffs into the lungs every 6 (six) hours as needed for wheezing or shortness of breath., Disp: 18 g, Rfl: 1   colchicine 0.6 MG tablet, Daily for 7 days then daily PRN for gout flare (Patient not taking: Reported on 04/19/2023), Disp: 30 tablet, Rfl: 0   metolazone (ZAROXOLYN) 2.5 MG tablet, Take 1 tablet (2.5 mg total) by mouth as directed. (Patient not taking: Reported on 01/29/2023), Disp: 5 tablet, Rfl: 2   zinc gluconate 50 MG tablet, Take 50 mg by mouth daily. (Patient not taking: Reported on 04/19/2023), Disp: , Rfl:  No Known Allergies   Social History   Socioeconomic History   Marital status: Legally Separated    Spouse name: Not on file   Number of children: Not on file   Years of education: Not on file   Highest education level: Not on file  Occupational History   Not on file  Tobacco Use   Smoking status: Never    Passive exposure: Never   Smokeless tobacco: Never  Vaping Use   Vaping Use: Never used  Substance and Sexual Activity   Alcohol use: Not Currently    Alcohol/week: 2.0 standard drinks of alcohol    Types: 2 Cans of beer per week   Drug use: No   Sexual activity: Yes    Birth control/protection: None  Other Topics Concern   Not on file  Social History Narrative   Not on file   Social Determinants of Health   Financial Resource Strain: Medium Risk (03/19/2023)   Overall Financial Resource Strain (CARDIA)    Difficulty of  Paying Living Expenses: Somewhat hard  Food Insecurity: No Food Insecurity  (07/01/2021)   Hunger Vital Sign    Worried About Running Out of Food in the Last Year: Never true    Ran Out of Food in the Last Year: Never true  Transportation Needs: Unmet Transportation Needs (07/01/2021)   PRAPARE - Administrator, Civil Service (Medical): Yes    Lack of Transportation (Non-Medical): Yes  Physical Activity: Not on file  Stress: Not on file  Social Connections: Not on file  Intimate Partner Violence: Not on file    Physical Exam      Future Appointments  Date Time Provider Department Center  05/08/2023  8:00 AM Dimple Casey, Jamesetta Orleans, MD [facility] None  05/14/2023 11:00 AM Ivonne Andrew, NP [facility] None  06/01/2023  7:40 AM [facility] DEVICE REMOTES [facility]  09/03/2023  7:40 AM [facility] DEVICE REMOTES [facility]  12/04/2023  7:40 AM [facility] DEVICE REMOTES [facility]  03/05/2024  7:40 AM [facility] DEVICE REMOTES [facility]  06/05/2024  7:40 AM [facility] DEVICE REMOTES [facility]  09/05/2024  7:40 AM [facility] DEVICE REMOTES [facility]

## 2023-04-19 NOTE — Telephone Encounter (Signed)
Received phone call from HF paramedic during visit. Patient is complaining of chest pain and increased shortness of breath. HF paramedic reports EKG changes, advised to present to ED for further evaluation.   Brynda Peon, AGACNP-BC  04/19/23 11:24 AM  Advanced Heart Failure Team

## 2023-04-25 ENCOUNTER — Telehealth (HOSPITAL_COMMUNITY): Payer: Self-pay

## 2023-04-25 NOTE — Telephone Encounter (Signed)
Called and spoke to [place] to check in as he did not follow up in the ER as recommended by me and HF team last week. He reports feeling well with no complaints advising he has been having no further symptoms. I advised him to reach out if needed and seek medical attention if the need arises. He agreed. Call complete.   Refills called into [facility]: Potassium (needs new RX- message sent to triage)  Lasix   [name], EMT-Paramedic [phone] 04/25/2023

## 2023-04-26 ENCOUNTER — Other Ambulatory Visit (HOSPITAL_COMMUNITY): Payer: Self-pay

## 2023-04-26 MED ORDER — POTASSIUM CHLORIDE CRYS ER 20 MEQ PO TBCR: 40.0000 meq | EXTENDED_RELEASE_TABLET | Freq: Every day | ORAL | 6 refills | Status: DC

## 2023-05-02 ENCOUNTER — Other Ambulatory Visit (HOSPITAL_COMMUNITY): Payer: Self-pay

## 2023-05-03 NOTE — Progress Notes (Signed)
Paramedicine Encounter    Patient ID: [name], male    DOB: June 01, 1961, 62 y.o.   MRN: [id number]   Complaints- none   Assessment- CAOX4, warm and dry ambulating into his home. No shortness of breath, no complaints of chest pain, dizziness or swelling. Lungs clear. Vitals obtained as noted. CBG- 121 with Freestyle Libre 3 in place.   Compliance with meds- left his pill box at his wifes house- I provided him with one so unknown if compliant over the last two weeks.   Pill box filled- two weeks   Refills needed- spironolactone, seroquel   Meds changes since last visit- none     Social changes- none   Arrived for home visit for [place] who reports to be feeling well today. He denied any recent chest pain, dizziness or shortness of breath. No swelling noted. Lungs clear. Vitals as noted. He had not yet had his morning meds. He admits he was at a party last night and stayed up too late but didn't mention any recreational drug or alcohol use. I reviewed meds and filled pill box for two weeks. Refills as noted. We reviewed upcoming appointments and I wrote down same. I also reminded him of med compliance and importance of same. He needed help paying his rent on his phone so I assisted with this. He denied any other social concerns at present. Home visit complete. I will see [name] in two weeks.   BP (!) 140/70   Pulse 78   Resp 16   Wt 292 lb (132.5 kg)   SpO2 95%   BMI 37.49 kg/m  Weight yesterday-- didn't weigh  Last visit weight-- 291lbs    [name], EMT-Paramedic [phone]  ACTION: Home visit completed    Patient Care Team: Ivonne Andrew, NP as PCP - General (Pulmonary Disease) Quintella Reichert, MD as PCP - Cardiology (Cardiology) Laurey Morale, MD as PCP - Advanced Heart Failure (Cardiology) Lanier Prude, MD as PCP - Electrophysiology (Cardiology) Burna Sis, LCSW as Social Worker (Licensed Clinical Social Worker) Tressia Danas, MD as  Consulting Physician (Gastroenterology)  Patient Active Problem List   Diagnosis Date Noted   Multiple joint pain 12/22/2022   Obesity (BMI 35.0-39.9 without comorbidity) 07/18/2022   Cellulitis 06/22/2022   Cellulitis, leg 06/20/2022   Presence of Watchman left atrial appendage closure device 10/28/2021   Atrial fibrillation (HCC) 10/27/2021   Melena    Chronic diastolic CHF (congestive heart failure) (HCC)    GI bleed 06/29/2021   Severe anemia 06/29/2021   Adenomatous polyp of ascending colon    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   Acute blood loss anemia    Angiodysplasia of stomach    Chronic anticoagulation    CHF exacerbation (HCC) 06/15/2020   AKI (acute kidney injury) (HCC) 06/15/2020   CKD (chronic kidney disease), stage III (HCC) 06/15/2020   Anemia due to chronic blood loss    Gastric AVM    Angiodysplasia of duodenum with hemorrhage    Chronic systolic CHF (congestive heart failure) (HCC) 04/05/2020   Anemia 04/05/2020   PAF (paroxysmal atrial fibrillation) (HCC) 04/05/2020   OSA (obstructive sleep apnea) 04/05/2020   Syncope and collapse 04/05/2020   Type 2 diabetes mellitus with hyperglycemia, with long-term current use of insulin (HCC) 12/03/2019   Diabetic polyneuropathy associated with type 2 diabetes mellitus (HCC) 12/03/2019   Erectile dysfunction 12/03/2019   Paranoid schizophrenia, chronic condition (HCC) 01/26/2015   Severe recurrent major depressive disorder  with psychotic features (HCC) 01/26/2015   GAD (generalized anxiety disorder) 01/26/2015   OCD (obsessive compulsive disorder) 01/26/2015   Panic disorder without agoraphobia 01/26/2015   Insomnia 01/26/2015    Current Outpatient Medications:    acetaminophen (TYLENOL) 500 MG tablet, Take 2 tablets (1,000 mg total) by mouth 2 (two) times daily as needed. (Patient taking differently: Take 1,000 mg by mouth 2 (two) times daily as needed.), Disp: 180 tablet, Rfl: 3    aspirin EC 81 MG tablet, Take 1 tablet (81 mg total) by mouth daily. Swallow whole., Disp: 90 tablet, Rfl: 3   atorvastatin (LIPITOR) 40 MG tablet, Take 1 tablet (40 mg total) by mouth daily., Disp: 90 tablet, Rfl: 3   blood glucose meter kit and supplies KIT, Dispense based on patient and insurance preference. Use up to four times daily as directed. (FOR ICD-9 250.00, 250.01)., Disp: 1 each, Rfl: 0   colchicine 0.6 MG tablet, Daily for 7 days then daily PRN for gout flare, Disp: 30 tablet, Rfl: 0   Continuous Glucose Receiver (FREESTYLE LIBRE 3 READER) DEVI, 1 each by Does not apply route daily. Use to check glucose continuously, Disp: 1 each, Rfl: 1   Continuous Glucose Sensor (FREESTYLE LIBRE 3 SENSOR) MISC, Place 1 sensor on the skin every 14 days. Use to check glucose continuously, Disp: 6 each, Rfl: 2   diclofenac Sodium (VOLTAREN) 1 % GEL, Apply 2 g topically daily as needed for pain., Disp: , Rfl:    DULoxetine (CYMBALTA) 30 MG capsule, Take 1 capsule (30 mg total) by mouth daily., Disp: 30 capsule, Rfl: 2   empagliflozin (JARDIANCE) 10 MG TABS tablet, TAKE 1 TABLET BY MOUTH ONCE DAILY ., Disp: 90 tablet, Rfl: 3   ferrous sulfate 325 (65 FE) MG tablet, Take 1 tablet (325 mg total) by mouth every other day. (Patient taking differently: Take 325 mg by mouth daily with breakfast.), Disp: 30 tablet, Rfl: 1   furosemide (LASIX) 40 MG tablet, Take 1.5 tablets (60 mg total) by mouth 2 (two) times daily., Disp: 200 tablet, Rfl: 5   gabapentin (NEURONTIN) 800 MG tablet, Take 1 tablet (800 mg total) by mouth 3 (three) times daily., Disp: 90 tablet, Rfl: 5   HUMALOG MIX 75/25 KWIKPEN (75-25) 100 UNIT/ML KwikPen, DIAL AND INJECT 30 UNITS UNDER THE SKIN TWICE DAILY, Disp: 75 mL, Rfl: 0   metFORMIN (GLUCOPHAGE-XR) 500 MG 24 hr tablet, Take 1 tablet (500 mg total) by mouth 2 (two) times daily with a meal., Disp: 180 tablet, Rfl: 1   metoprolol succinate (TOPROL-XL) 50 MG 24 hr tablet, Take 1 tablet (50 mg  total) by mouth in the morning and at bedtime. Take with or immediately following a meal., Disp: 180 tablet, Rfl: 2   Multiple Vitamins-Minerals (CENTRUM SILVER 50+MEN) TABS, Take 1 tablet by mouth daily., Disp: , Rfl:    mupirocin ointment (BACTROBAN) 2 %, Apply 1 Application topically 2 (two) times daily., Disp: 22 g, Rfl: 0   pantoprazole (PROTONIX) 40 MG tablet, Take 1 tablet by mouth once daily, Disp: 90 tablet, Rfl: 0   potassium chloride SA (KLOR-CON M) 20 MEQ tablet, Take 2 tablets (40 mEq total) by mouth daily., Disp: 60 tablet, Rfl: 6   QUEtiapine (SEROQUEL) 300 MG tablet, Take 1.5 tablets (450 mg total) by mouth at bedtime., Disp: 135 tablet, Rfl: 1   RELION PEN NEEDLES 31G X 6 MM MISC, INJECT 1 PEN INTO THE SKIN 2 TIMES DAILY, Disp: 100 each, Rfl: 0  sacubitril-valsartan (ENTRESTO) 24-26 MG, Take 1 tablet by mouth 2 (two) times daily., Disp: 180 tablet, Rfl: 3   Semaglutide, 2 MG/DOSE, (OZEMPIC, 2 MG/DOSE,) 8 MG/3ML SOPN, Inject 2 mg into the skin once a week. Monday, Disp: , Rfl:    sildenafil (VIAGRA) 100 MG tablet, Take 0.5-1 tablets (50-100 mg total) by mouth daily as needed for erectile dysfunction., Disp: 5 tablet, Rfl: 11   spironolactone (ALDACTONE) 25 MG tablet, Take 1 tablet (25 mg total) by mouth daily., Disp: 90 tablet, Rfl: 3   Vitamin D, Ergocalciferol, (DRISDOL) 1.25 MG (50000 UNIT) CAPS capsule, Take 50,000 Units by mouth once a week., Disp: , Rfl:    albuterol (VENTOLIN HFA) 108 (90 Base) MCG/ACT inhaler, Inhale 2 puffs into the lungs every 6 (six) hours as needed for wheezing or shortness of breath., Disp: 18 g, Rfl: 1   metolazone (ZAROXOLYN) 2.5 MG tablet, Take 1 tablet (2.5 mg total) by mouth as directed. (Patient not taking: Reported on 01/29/2023), Disp: 5 tablet, Rfl: 2   Oxycodone HCl 10 MG TABS, Take 10 mg by mouth 3 (three) times daily. (Patient not taking: Reported on 05/03/2023), Disp: , Rfl:    tadalafil (CIALIS) 20 MG tablet, Take 20 mg by mouth daily as needed.  (Patient not taking: Reported on 05/03/2023), Disp: , Rfl:    zinc gluconate 50 MG tablet, Take 50 mg by mouth daily. (Patient not taking: Reported on 05/03/2023), Disp: , Rfl:  No Known Allergies   Social History   Socioeconomic History   Marital status: Legally Separated    Spouse name: Not on file   Number of children: Not on file   Years of education: Not on file   Highest education level: Not on file  Occupational History   Not on file  Tobacco Use   Smoking status: Never    Passive exposure: Never   Smokeless tobacco: Never  Vaping Use   Vaping Use: Never used  Substance and Sexual Activity   Alcohol use: Not Currently    Alcohol/week: 2.0 standard drinks of alcohol    Types: 2 Cans of beer per week   Drug use: No   Sexual activity: Yes    Birth control/protection: None  Other Topics Concern   Not on file  Social History Narrative   Not on file   Social Determinants of Health   Financial Resource Strain: Medium Risk (03/19/2023)   Overall Financial Resource Strain (CARDIA)    Difficulty of Paying Living Expenses: Somewhat hard  Food Insecurity: No Food Insecurity (07/01/2021)   Hunger Vital Sign    Worried About Running Out of Food in the Last Year: Never true    Ran Out of Food in the Last Year: Never true  Transportation Needs: Unmet Transportation Needs (07/01/2021)   PRAPARE - Administrator, Civil Service (Medical): Yes    Lack of Transportation (Non-Medical): Yes  Physical Activity: Not on file  Stress: Not on file  Social Connections: Not on file  Intimate Partner Violence: Not on file    Physical Exam      Future Appointments  Date Time Provider Department Center  05/08/2023  8:00 AM Dimple Casey, Jamesetta Orleans, MD [facility] None  05/14/2023 11:00 AM Ivonne Andrew, NP [facility] None  06/01/2023  7:40 AM [facility] DEVICE REMOTES [facility]  09/03/2023  7:40 AM [facility] DEVICE REMOTES [facility]  12/04/2023  7:40 AM  [facility] DEVICE REMOTES [facility]  03/05/2024  7:40 AM [facility] DEVICE REMOTES  [facility]  06/05/2024  7:40 AM [facility] DEVICE REMOTES [facility]  09/05/2024  7:40 AM [facility] DEVICE REMOTES [facility]

## 2023-05-08 ENCOUNTER — Telehealth (HOSPITAL_COMMUNITY): Payer: Self-pay

## 2023-05-08 ENCOUNTER — Encounter: Payer: Medicare Other | Admitting: Internal Medicine

## 2023-05-08 NOTE — Telephone Encounter (Signed)
Called in two refills for Mr. Tello today at [facility]. -Sprionolactone -Seroquel    I also called to check with Mr. Matranga as to why he missed his scheduled appointment with Rheumatology today and he reports he had forgotten he had an appointment despite me writing it down for him last week at our home visit. I called the office of Dr. Dimple Casey and they advised since it was a no show they would have to get permission from the doctor to re-schedule. They took my name and number down once they find out to assist Mr. Vaca in rescheduling.   [name], EMT-Paramedic [phone] 05/08/2023

## 2023-05-08 NOTE — Progress Notes (Deleted)
Office Visit Note  Patient: [name]             Date of Birth: September 23, 1961           MRN: [id number]             PCP: Ivonne Andrew, NP Referring: Fanny Dance, MD Visit Date: 05/08/2023 Occupation: @GUAROCC @  Subjective:  No chief complaint on file.   History of Present Illness: [name] is a 62 y.o. male here for evaluation of chronic pain of multiple areas with elevated sedimentation rate.***   Labs reviewed RF neg ESR 91 CRP 5 Uric acid 8.3   Activities of Daily Living:  Patient reports morning stiffness for *** {minute/hour:19697}.   Patient {ACTIONS;DENIES/REPORTS:21021675::"Denies"} nocturnal pain.  Difficulty dressing/grooming: {ACTIONS;DENIES/REPORTS:21021675::"Denies"} Difficulty climbing stairs: {ACTIONS;DENIES/REPORTS:21021675::"Denies"} Difficulty getting out of chair: {ACTIONS;DENIES/REPORTS:21021675::"Denies"} Difficulty using hands for taps, buttons, cutlery, and/or writing: {ACTIONS;DENIES/REPORTS:21021675::"Denies"}  No Rheumatology ROS completed.   PMFS History:  Patient Active Problem List   Diagnosis Date Noted   Multiple joint pain 12/22/2022   Obesity (BMI 35.0-39.9 without comorbidity) 07/18/2022   Cellulitis 06/22/2022   Cellulitis, leg 06/20/2022   Presence of Watchman left atrial appendage closure device 10/28/2021   Atrial fibrillation (HCC) 10/27/2021   Melena    Chronic diastolic CHF (congestive heart failure) (HCC)    GI bleed 06/29/2021   Severe anemia 06/29/2021   Adenomatous polyp of ascending colon    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   Acute blood loss anemia    Angiodysplasia of stomach    Chronic anticoagulation    CHF exacerbation (HCC) 06/15/2020   AKI (acute kidney injury) (HCC) 06/15/2020   CKD (chronic kidney disease), stage III (HCC) 06/15/2020   Anemia due to chronic blood loss    Gastric AVM    Angiodysplasia of duodenum with hemorrhage    Chronic  systolic CHF (congestive heart failure) (HCC) 04/05/2020   Anemia 04/05/2020   PAF (paroxysmal atrial fibrillation) (HCC) 04/05/2020   OSA (obstructive sleep apnea) 04/05/2020   Syncope and collapse 04/05/2020   Type 2 diabetes mellitus with hyperglycemia, with long-term current use of insulin (HCC) 12/03/2019   Diabetic polyneuropathy associated with type 2 diabetes mellitus (HCC) 12/03/2019   Erectile dysfunction 12/03/2019   Paranoid schizophrenia, chronic condition (HCC) 01/26/2015   Severe recurrent major depressive disorder with psychotic features (HCC) 01/26/2015   GAD (generalized anxiety disorder) 01/26/2015   OCD (obsessive compulsive disorder) 01/26/2015   Panic disorder without agoraphobia 01/26/2015   Insomnia 01/26/2015    Past Medical History:  Diagnosis Date   AICD (automatic cardioverter/defibrillator) present    Anxiety    Atrial fibrillation (HCC)    CAD (coronary artery disease)    Cardiomyopathy (HCC)    CHF (congestive heart failure) (HCC) 09/2019   Depression    Diabetes mellitus    Erectile dysfunction 11/2019   GI bleeding    H/O right heart catheterization 09/2019   Hypertension    Presence of permanent cardiac pacemaker    Presence of Watchman left atrial appendage closure device 10/27/2021   27 mm Watchman Flex Device per Dr. Lalla Brothers   Schizophrenia [facility])    Sleep apnea    uses cpap    Family History  Problem Relation Age of Onset   Heart failure Mother    Mental illness Sister    Mental illness Sister    Past Surgical History:  Procedure Laterality Date   BIOPSY  04/19/2021   Procedure: BIOPSY;  Surgeon:  Sherrilyn Rist, MD;  Location: [facility] ENDOSCOPY;  Service: Gastroenterology;;   COLONOSCOPY WITH PROPOFOL N/A 04/16/2021   Procedure: COLONOSCOPY WITH PROPOFOL;  Surgeon: Tressia Danas, MD;  Location: [facility] ENDOSCOPY;  Service: Gastroenterology;  Laterality: N/A;   CORONARY STENT PLACEMENT     ENTEROSCOPY N/A 04/08/2020   Procedure:  ENTEROSCOPY;  Surgeon: Sherrilyn Rist, MD;  Location: [facility] ENDOSCOPY;  Service: Gastroenterology;  Laterality: N/A;   ENTEROSCOPY N/A 06/17/2020   Procedure: ENTEROSCOPY;  Surgeon: Hilarie Fredrickson, MD;  Location: [facility] ENDOSCOPY;  Service: Endoscopy;  Laterality: N/A;   ENTEROSCOPY N/A 04/15/2021   Procedure: ENTEROSCOPY;  Surgeon: Tressia Danas, MD;  Location: [facility] ENDOSCOPY;  Service: Gastroenterology;  Laterality: N/A;   ENTEROSCOPY N/A 04/19/2021   Procedure: ENTEROSCOPY;  Surgeon: Sherrilyn Rist, MD;  Location: [facility] ENDOSCOPY;  Service: Gastroenterology;  Laterality: N/A;   ENTEROSCOPY N/A 07/01/2021   Procedure: ENTEROSCOPY;  Surgeon: Beverley Fiedler, MD;  Location: [facility] ENDOSCOPY;  Service: Gastroenterology;  Laterality: N/A;   GIVENS CAPSULE STUDY  04/16/2021   Procedure: GIVENS CAPSULE STUDY;  Surgeon: Tressia Danas, MD;  Location: [facility] ENDOSCOPY;  Service: Gastroenterology;;   HEMOSTASIS CLIP PLACEMENT  04/08/2020   Procedure: HEMOSTASIS CLIP PLACEMENT;  Surgeon: Sherrilyn Rist, MD;  Location: [facility] ENDOSCOPY;  Service: Gastroenterology;;   HEMOSTASIS CLIP PLACEMENT  07/01/2021   Procedure: HEMOSTASIS CLIP PLACEMENT;  Surgeon: Beverley Fiedler, MD;  Location: [facility] ENDOSCOPY;  Service: Gastroenterology;;   HEMOSTASIS CONTROL  04/08/2020   Procedure: HEMOSTASIS CONTROL;  Surgeon: Sherrilyn Rist, MD;  Location: [facility] ENDOSCOPY;  Service: Gastroenterology;;   HEMOSTASIS CONTROL  06/17/2020   Procedure: HEMOSTASIS CONTROL;  Surgeon: Hilarie Fredrickson, MD;  Location: [facility] ENDOSCOPY;  Service: Endoscopy;;   HERNIA REPAIR     HOT HEMOSTASIS N/A 04/15/2021   Procedure: HOT HEMOSTASIS (ARGON PLASMA COAGULATION/BICAP);  Surgeon: Tressia Danas, MD;  Location: [facility] ENDOSCOPY;  Service: Gastroenterology;  Laterality: N/A;   HOT HEMOSTASIS N/A 07/01/2021   Procedure: HOT HEMOSTASIS (ARGON PLASMA COAGULATION/BICAP);  Surgeon: Beverley Fiedler, MD;  Location: [facility] ENDOSCOPY;  Service: Gastroenterology;  Laterality: N/A;   ICD  IMPLANT N/A 09/03/2020   Procedure: ICD IMPLANT;  Surgeon: Lanier Prude, MD;  Location: [facility] INVASIVE CV LAB;  Service: Cardiovascular;  Laterality: N/A;   LEFT ATRIAL APPENDAGE OCCLUSION N/A 10/27/2021   Procedure: LEFT ATRIAL APPENDAGE OCCLUSION;  Surgeon: Lanier Prude, MD;  Location: [facility] INVASIVE CV LAB;  Service: Cardiovascular;  Laterality: N/A;   POLYPECTOMY  04/16/2021   Procedure: POLYPECTOMY;  Surgeon: Tressia Danas, MD;  Location: [facility] ENDOSCOPY;  Service: Gastroenterology;;   RIGHT/LEFT HEART CATH AND CORONARY ANGIOGRAPHY N/A 10/14/2019   Procedure: RIGHT/LEFT HEART CATH AND CORONARY ANGIOGRAPHY;  Surgeon: Lyn Records, MD;  Location: [facility] INVASIVE CV LAB;  Service: Cardiovascular;  Laterality: N/A;   SUBMUCOSAL TATTOO INJECTION  04/19/2021   Procedure: SUBMUCOSAL TATTOO INJECTION;  Surgeon: Sherrilyn Rist, MD;  Location: [facility] ENDOSCOPY;  Service: Gastroenterology;;   TEE WITHOUT CARDIOVERSION N/A 10/27/2021   Procedure: TRANSESOPHAGEAL ECHOCARDIOGRAM (TEE);  Surgeon: Lanier Prude, MD;  Location: [facility] INVASIVE CV LAB;  Service: Cardiovascular;  Laterality: N/A;   TEE WITHOUT CARDIOVERSION N/A 12/12/2021   Procedure: TRANSESOPHAGEAL ECHOCARDIOGRAM (TEE);  Surgeon: Sande Rives, MD;  Location: [facility] ENDOSCOPY;  Service: Cardiovascular;  Laterality: N/A;   Social History   Social History Narrative   Not on file   Immunization History  Administered Date(s) Administered   Influenza,inj,Quad PF,6+  Mos 10/13/2019, 09/13/2020, 08/03/2021, 11/01/2022   PFIZER(Purple Top)SARS-COV-2 Vaccination 03/13/2020   Pneumococcal Polysaccharide-23 10/16/2019   Tdap 09/13/2020   Zoster Recombinat (Shingrix) 08/03/2021     Objective: Vital Signs: There were no vitals taken for this visit.   Physical Exam   Musculoskeletal Exam: ***  CDAI Exam: CDAI Score: -- Patient Global: --; Provider Global: -- Swollen: --; Tender: -- Joint Exam 05/08/2023   No joint exam has been  documented for this visit   There is currently no information documented on the homunculus. Go to the Rheumatology activity and complete the homunculus joint exam.  Investigation: No additional findings.  Imaging: No results found.  Recent Labs: Lab Results  Component Value Date   WBC 6.5 01/29/2023   HGB 13.7 01/29/2023   PLT 311 01/29/2023   NA 139 02/08/2023   K 4.6 02/08/2023   CL 101 02/08/2023   CO2 27 02/08/2023   GLUCOSE 194 (H) 02/08/2023   BUN 13 02/08/2023   CREATININE 1.18 02/08/2023   BILITOT <0.2 12/22/2022   ALKPHOS 96 12/22/2022   AST 21 12/22/2022   ALT 16 12/22/2022   PROT 7.6 12/22/2022   ALBUMIN 4.1 12/22/2022   CALCIUM 9.1 02/08/2023   GFRAA 96 09/01/2020    Speciality Comments: No specialty comments available.  Procedures:  No procedures performed Allergies: Patient has no known allergies.   Assessment / Plan:     Visit Diagnoses: No diagnosis found.  Orders: No orders of the defined types were placed in this encounter.  No orders of the defined types were placed in this encounter.   Face-to-face time spent with patient was *** minutes. Greater than 50% of time was spent in counseling and coordination of care.  Follow-Up Instructions: No follow-ups on file.   Fuller Plan, MD  Note - This record has been created using [employer].  Chart creation errors have been sought, but may not always  have been located. Such creation errors do not reflect on  the standard of medical care.

## 2023-05-14 ENCOUNTER — Ambulatory Visit (INDEPENDENT_AMBULATORY_CARE_PROVIDER_SITE_OTHER): Payer: Medicare Other | Admitting: Nurse Practitioner

## 2023-05-14 ENCOUNTER — Encounter: Payer: Self-pay | Admitting: Nurse Practitioner

## 2023-05-14 VITALS — BP 111/77 | HR 64 | Temp 97.2°F | Wt 291.0 lb

## 2023-05-14 DIAGNOSIS — E1165 Type 2 diabetes mellitus with hyperglycemia: Secondary | ICD-10-CM

## 2023-05-14 DIAGNOSIS — M5441 Lumbago with sciatica, right side: Secondary | ICD-10-CM | POA: Insufficient documentation

## 2023-05-14 DIAGNOSIS — Z794 Long term (current) use of insulin: Secondary | ICD-10-CM | POA: Diagnosis not present

## 2023-05-14 LAB — POCT GLYCOSYLATED HEMOGLOBIN (HGB A1C): Hemoglobin A1C: 6.5 % — AB (ref 4.0–5.6)

## 2023-05-14 MED ORDER — KETOROLAC TROMETHAMINE 60 MG/2ML IM SOLN
60.0000 mg | Freq: Once | INTRAMUSCULAR | Status: AC
  Administered 2023-05-14: 60 mg via INTRAMUSCULAR

## 2023-05-14 MED ORDER — KETOROLAC TROMETHAMINE 30 MG/ML IJ SOLN: 30.0000 mg | Freq: Once | INTRAMUSCULAR | Status: DC

## 2023-05-14 MED ORDER — PREDNISONE 20 MG PO TABS: 20.0000 mg | ORAL_TABLET | Freq: Every day | ORAL | 0 refills | Status: AC

## 2023-05-14 NOTE — Assessment & Plan Note (Signed)
-   predniSONE (DELTASONE) 20 MG tablet; Take 1 tablet (20 mg total) by mouth daily with breakfast for 5 days.  Dispense: 5 tablet; Refill: 0 - CBC - Comprehensive metabolic panel - ketorolac (TORADOL) injection 60 mg  2. Type 2 diabetes mellitus with hyperglycemia, with long-term current use of insulin (HCC)  - POCT glycosylated hemoglobin (Hb A1C)    Follow up:  Follow up in 3 months

## 2023-05-14 NOTE — Progress Notes (Signed)
@Patient  ID: [name], male    DOB: 04/27/1961, 62 y.o.   MRN: [id number]  Chief Complaint  Patient presents with   Diabetes    Follow up    Referring provider: Ivonne Andrew, NP   HPI  [name] 62 y.o. male  has a past medical history of AICD (automatic cardioverter/defibrillator) present, Anxiety, Atrial fibrillation (HCC), CAD (coronary artery disease), Cardiomyopathy (HCC), CHF (congestive heart failure) (HCC) (09/2019), Depression, Diabetes mellitus, Erectile dysfunction (11/2019), GI bleeding, H/O right heart catheterization (09/2019), Hypertension, Presence of permanent cardiac pacemaker, Presence of Watchman left atrial appendage closure device (10/27/2021), Schizophrenia (HCC), and Sleep apnea.     Patient presents today for diabetes follow-up.  A1c in office today is 6.5.  Pharmacist is following patient for diabetic medication management.  Patient complains today of right lower back pain radiating down his right leg.  He states that this has been an issue for the past couple weeks.  We discussed that we can give him a Toradol injection in office today and can give him prednisone to start tomorrow but he does need to watch his blood sugars closely.  Denies f/c/s, n/v/d, hemoptysis, PND, leg swelling Denies chest pain or edema     No Known Allergies  Immunization History  Administered Date(s) Administered   Influenza,inj,Quad PF,6+ Mos 10/13/2019, 09/13/2020, 08/03/2021, 11/01/2022   PFIZER(Purple Top)SARS-COV-2 Vaccination 03/13/2020   Pneumococcal Polysaccharide-23 10/16/2019   Tdap 09/13/2020   Zoster Recombinat (Shingrix) 08/03/2021    Past Medical History:  Diagnosis Date   AICD (automatic cardioverter/defibrillator) present    Anxiety    Atrial fibrillation (HCC)    CAD (coronary artery disease)    Cardiomyopathy (HCC)    CHF (congestive heart failure) (HCC) 09/2019   Depression    Diabetes mellitus    Erectile dysfunction 11/2019    GI bleeding    H/O right heart catheterization 09/2019   Hypertension    Presence of permanent cardiac pacemaker    Presence of Watchman left atrial appendage closure device 10/27/2021   27 mm Watchman Flex Device per Dr. Lalla Brothers   Schizophrenia [facility])    Sleep apnea    uses cpap    Tobacco History: Social History   Tobacco Use  Smoking Status Never   Passive exposure: Never  Smokeless Tobacco Never   Counseling given: Not Answered   Outpatient Encounter Medications as of 05/14/2023  Medication Sig   acetaminophen (TYLENOL) 500 MG tablet Take 2 tablets (1,000 mg total) by mouth 2 (two) times daily as needed. (Patient taking differently: Take 1,000 mg by mouth 2 (two) times daily as needed.)   albuterol (VENTOLIN HFA) 108 (90 Base) MCG/ACT inhaler Inhale 2 puffs into the lungs every 6 (six) hours as needed for wheezing or shortness of breath.   aspirin EC 81 MG tablet Take 1 tablet (81 mg total) by mouth daily. Swallow whole.   atorvastatin (LIPITOR) 40 MG tablet Take 1 tablet (40 mg total) by mouth daily.   blood glucose meter kit and supplies KIT Dispense based on patient and insurance preference. Use up to four times daily as directed. (FOR ICD-9 250.00, 250.01).   colchicine 0.6 MG tablet Daily for 7 days then daily PRN for gout flare   Continuous Glucose Receiver (FREESTYLE LIBRE 3 READER) DEVI 1 each by Does not apply route daily. Use to check glucose continuously   Continuous Glucose Sensor (FREESTYLE LIBRE 3 SENSOR) MISC Place 1 sensor on the skin every 14 days. Use  to check glucose continuously   diclofenac Sodium (VOLTAREN) 1 % GEL Apply 2 g topically daily as needed for pain.   DULoxetine (CYMBALTA) 30 MG capsule Take 1 capsule (30 mg total) by mouth daily.   empagliflozin (JARDIANCE) 10 MG TABS tablet TAKE 1 TABLET BY MOUTH ONCE DAILY .   ferrous sulfate 325 (65 FE) MG tablet Take 1 tablet (325 mg total) by mouth every other day. (Patient taking differently: Take 325 mg  by mouth daily with breakfast.)   furosemide (LASIX) 40 MG tablet Take 1.5 tablets (60 mg total) by mouth 2 (two) times daily.   HUMALOG MIX 75/25 KWIKPEN (75-25) 100 UNIT/ML KwikPen DIAL AND INJECT 30 UNITS UNDER THE SKIN TWICE DAILY   metFORMIN (GLUCOPHAGE-XR) 500 MG 24 hr tablet Take 1 tablet (500 mg total) by mouth 2 (two) times daily with a meal.   metoprolol succinate (TOPROL-XL) 50 MG 24 hr tablet Take 1 tablet (50 mg total) by mouth in the morning and at bedtime. Take with or immediately following a meal.   Multiple Vitamins-Minerals (CENTRUM SILVER 50+MEN) TABS Take 1 tablet by mouth daily.   pantoprazole (PROTONIX) 40 MG tablet Take 1 tablet by mouth once daily   potassium chloride SA (KLOR-CON M) 20 MEQ tablet Take 2 tablets (40 mEq total) by mouth daily.   predniSONE (DELTASONE) 20 MG tablet Take 1 tablet (20 mg total) by mouth daily with breakfast for 5 days.   QUEtiapine (SEROQUEL) 300 MG tablet Take 1.5 tablets (450 mg total) by mouth at bedtime.   RELION PEN NEEDLES 31G X 6 MM MISC INJECT 1 PEN INTO THE SKIN 2 TIMES DAILY   sacubitril-valsartan (ENTRESTO) 24-26 MG Take 1 tablet by mouth 2 (two) times daily.   Semaglutide, 2 MG/DOSE, (OZEMPIC, 2 MG/DOSE,) 8 MG/3ML SOPN Inject 2 mg into the skin once a week. Monday   sildenafil (VIAGRA) 100 MG tablet Take 0.5-1 tablets (50-100 mg total) by mouth daily as needed for erectile dysfunction.   spironolactone (ALDACTONE) 25 MG tablet Take 1 tablet (25 mg total) by mouth daily.   Vitamin D, Ergocalciferol, (DRISDOL) 1.25 MG (50000 UNIT) CAPS capsule Take 50,000 Units by mouth once a week.   zinc gluconate 50 MG tablet Take 50 mg by mouth daily.   gabapentin (NEURONTIN) 800 MG tablet Take 1 tablet (800 mg total) by mouth 3 (three) times daily. (Patient not taking: Reported on 05/14/2023)   metolazone (ZAROXOLYN) 2.5 MG tablet Take 1 tablet (2.5 mg total) by mouth as directed. (Patient not taking: Reported on 01/29/2023)   mupirocin ointment  (BACTROBAN) 2 % Apply 1 Application topically 2 (two) times daily. (Patient not taking: Reported on 05/14/2023)   Oxycodone HCl 10 MG TABS Take 10 mg by mouth 3 (three) times daily. (Patient not taking: Reported on 05/03/2023)   tadalafil (CIALIS) 20 MG tablet Take 20 mg by mouth daily as needed. (Patient not taking: Reported on 05/03/2023)   [EXPIRED] ketorolac (TORADOL) injection 60 mg    [DISCONTINUED] ketorolac (TORADOL) 30 MG/ML injection 30 mg    No facility-administered encounter medications on file as of 05/14/2023.     Review of Systems  Review of Systems  Constitutional: Negative.   HENT: Negative.    Cardiovascular: Negative.   Gastrointestinal: Negative.   Musculoskeletal:  Positive for back pain (right side sciatica).  Allergic/Immunologic: Negative.   Neurological: Negative.   Psychiatric/Behavioral: Negative.         Physical Exam  BP 111/77   Pulse 64  Temp (!) 97.2 F (36.2 C)   Wt 291 lb (132 kg)   SpO2 98%   BMI 37.36 kg/m   Wt Readings from Last 5 Encounters:  05/14/23 291 lb (132 kg)  05/02/23 292 lb (132.5 kg)  04/19/23 291 lb (132 kg)  04/04/23 291 lb (132 kg)  03/06/23 (!) 301 lb (136.5 kg)     Physical Exam Vitals and nursing note reviewed.  Constitutional:      General: He is not in acute distress.    Appearance: He is well-developed.  Cardiovascular:     Rate and Rhythm: Normal rate and regular rhythm.  Pulmonary:     Effort: Pulmonary effort is normal.     Breath sounds: Normal breath sounds.  Musculoskeletal:       Legs:     Comments: Low back pain radiating down right leg  Skin:    General: Skin is warm and dry.  Neurological:     Mental Status: He is alert and oriented to person, place, and time.      Lab Results:  CBC    Component Value Date/Time   WBC 6.5 01/29/2023 1533   RBC 5.77 01/29/2023 1533   HGB 13.7 01/29/2023 1533   HGB 13.2 12/22/2022 1101   HCT 47.6 01/29/2023 1533   HCT 43.8 12/22/2022 1101   PLT  311 01/29/2023 1533   PLT 291 12/22/2022 1101   MCV 82.5 01/29/2023 1533   MCV 78 (L) 12/22/2022 1101   MCH 23.7 (L) 01/29/2023 1533   MCHC 28.8 (L) 01/29/2023 1533   RDW 15.0 01/29/2023 1533   RDW 17.2 (H) 12/22/2022 1101   LYMPHSABS 1.3 06/20/2022 1816   LYMPHSABS 1.2 09/01/2020 1020   MONOABS 1.1 (H) 06/20/2022 1816   EOSABS 0.2 06/20/2022 1816   EOSABS 0.2 09/01/2020 1020   BASOSABS 0.0 06/20/2022 1816   BASOSABS 0.0 09/01/2020 1020    BMET    Component Value Date/Time   NA 139 02/08/2023 1129   NA 140 12/22/2022 1101   K 4.6 02/08/2023 1129   CL 101 02/08/2023 1129   CO2 27 02/08/2023 1129   GLUCOSE 194 (H) 02/08/2023 1129   BUN 13 02/08/2023 1129   BUN 19 12/22/2022 1101   CREATININE 1.18 02/08/2023 1129   CREATININE 1.19 03/29/2015 1402   CALCIUM 9.1 02/08/2023 1129   GFRNONAA >60 02/08/2023 1129   GFRAA 96 09/01/2020 1020    BNP    Component Value Date/Time   BNP 53.1 01/29/2023 1533    ProBNP    Component Value Date/Time   PROBNP 249 (H) 02/23/2021 1315    Imaging: No results found.   Assessment & Plan:   Acute right-sided low back pain with right-sided sciatica - predniSONE (DELTASONE) 20 MG tablet; Take 1 tablet (20 mg total) by mouth daily with breakfast for 5 days.  Dispense: 5 tablet; Refill: 0 - CBC - Comprehensive metabolic panel - ketorolac (TORADOL) injection 60 mg  2. Type 2 diabetes mellitus with hyperglycemia, with long-term current use of insulin (HCC)  - POCT glycosylated hemoglobin (Hb A1C)    Follow up:  Follow up in 3 months     Ivonne Andrew, NP 05/14/2023

## 2023-05-14 NOTE — Patient Instructions (Addendum)
1. Acute right-sided low back pain with right-sided sciatica  - predniSONE (DELTASONE) 20 MG tablet; Take 1 tablet (20 mg total) by mouth daily with breakfast for 5 days.  Dispense: 5 tablet; Refill: 0 - CBC - Comprehensive metabolic panel - ketorolac (TORADOL) injection 60 mg  2. Type 2 diabetes mellitus with hyperglycemia, with long-term current use of insulin (HCC)  - POCT glycosylated hemoglobin (Hb A1C)    Follow up:  Follow up in 3 months    Sciatica  Sciatica is pain, weakness, tingling, or loss of feeling (numbness) along the sciatic nerve. The sciatic nerve starts in the lower back and goes down the back of each leg. Sciatica usually affects one side of the body. Sciatica usually goes away on its own or with treatment. Sometimes, sciatica may come back. What are the causes? This condition happens when the sciatic nerve is pinched or has pressure put on it. This may be caused by: A disk in between the bones of the spine bulging out too far (herniated disk). Changes in the spinal disks due to aging. A condition that affects a muscle in the butt. Extra bone growth near the sciatic nerve. A break (fracture) of the area between your hip bones (pelvis). Pregnancy. Tumor. This is rare. What increases the risk? You are more likely to develop this condition if you: Play sports that put pressure or stress on the spine. Have poor strength and ease of movement (flexibility). Have had a back injury or back surgery. Sit for long periods of time. Do activities that involve bending or lifting over and over again. Are very overweight (obese). What are the signs or symptoms? Symptoms can vary from mild to very bad. They may include: Any of these problems in the lower back, leg, hip, or butt: Mild tingling, loss of feeling, or dull aches. A burning feeling. Sharp pains. Loss of feeling in the back of the calf or the sole of the foot. Leg weakness. Very bad back pain that makes  it hard to move. These symptoms may get worse when you cough, sneeze, or laugh. They may also get worse when you sit or stand for long periods of time. How is this treated? This condition often gets better without any treatment. However, treatment may include: Changing or cutting back on physical activity when you have pain. Exercising, including strengthening and stretching. Putting ice or heat on the affected area. Shots of medicines to relieve pain and swelling or to relax your muscles. Surgery. Follow these instructions at home: Medicines Take over-the-counter and prescription medicines only as told by your doctor. Ask your doctor if you should avoid driving or using machines while you are taking your medicine. Managing pain     If told, put ice on the affected area. To do this: Put ice in a plastic bag. Place a towel between your skin and the bag. Leave the ice on for 20 minutes, 2-3 times a day. If your skin turns bright red, take off the ice right away to prevent skin damage. The risk of skin damage is higher if you cannot feel pain, heat, or cold. If told, put heat on the affected area. Do this as often as told by your doctor. Use the heat source that your doctor tells you to use, such as a moist heat pack or a heating pad. Place a towel between your skin and the heat source. Leave the heat on for 20-30 minutes. If your skin turns bright red, take off  the heat right away to prevent burns. The risk of burns is higher if you cannot feel pain, heat, or cold. Activity  Return to your normal activities when your doctor says that it is safe. Avoid activities that make your symptoms worse. Take short rests during the day. When you rest for a long time, do some physical activity or stretching between periods of rest. Avoid sitting for a long time without moving. Get up and move around at least one time each hour. Do exercises and stretches as told by your doctor. Do not lift  anything that is heavier than 10 lb (4.5 kg). Avoid lifting heavy things even when you do not have symptoms. Avoid lifting heavy things over and over. When you lift objects, always lift in a way that is safe for your body. To do this, you should: Bend your knees. Keep the object close to your body. Avoid twisting. General instructions Stay at a healthy weight. Wear comfortable shoes that support your feet. Avoid wearing high heels. Avoid sleeping on a mattress that is too soft or too hard. You might have less pain if you sleep on a mattress that is firm enough to support your back. Contact a doctor if: Your pain is not controlled by medicine. Your pain does not get better. Your pain gets worse. Your pain lasts longer than 4 weeks. You lose weight without trying. Get help right away if: You cannot control when you pee (urinate) or poop (have a bowel movement). You have weakness in any of these areas and it gets worse: Lower back. The area between your hip bones. Butt. Legs. You have redness or swelling of your back. You have a burning feeling when you pee. Summary Sciatica is pain, weakness, tingling, or loss of feeling (numbness) along the sciatic nerve. This may include the lower back, legs, hips, and butt. This condition happens when the sciatic nerve is pinched or has pressure put on it. Treatment often includes rest, exercise, medicines, and putting ice or heat on the affected area. This information is not intended to replace advice given to you by your health care provider. Make sure you discuss any questions you have with your health care provider. Document Revised: 02/20/2022 Document Reviewed: 02/20/2022 Elsevier Patient Education  2024 [employer].

## 2023-05-15 ENCOUNTER — Telehealth (HOSPITAL_COMMUNITY): Payer: Self-pay

## 2023-05-15 LAB — COMPREHENSIVE METABOLIC PANEL
ALT: 24 IU/L (ref 0–44)
AST: 26 IU/L (ref 0–40)
Albumin: 4.1 g/dL (ref 3.9–4.9)
Alkaline Phosphatase: 94 IU/L (ref 44–121)
BUN/Creatinine Ratio: 20 (ref 10–24)
BUN: 23 mg/dL (ref 8–27)
Bilirubin Total: 0.2 mg/dL (ref 0.0–1.2)
CO2: 23 mmol/L (ref 20–29)
Calcium: 9.3 mg/dL (ref 8.6–10.2)
Chloride: 103 mmol/L (ref 96–106)
Creatinine, Ser: 1.15 mg/dL (ref 0.76–1.27)
Globulin, Total: 3.7 g/dL (ref 1.5–4.5)
Glucose: 92 mg/dL (ref 70–99)
Potassium: 5.1 mmol/L (ref 3.5–5.2)
Sodium: 142 mmol/L (ref 134–144)
Total Protein: 7.8 g/dL (ref 6.0–8.5)
eGFR: 72 mL/min/{1.73_m2} (ref >59)

## 2023-05-15 LAB — CBC
Hematocrit: 46.4 % (ref 37.5–51.0)
Hemoglobin: 14.3 g/dL (ref 13.0–17.7)
MCH: 25 pg — ABNORMAL LOW (ref 26.6–33.0)
MCHC: 30.8 g/dL — ABNORMAL LOW (ref 31.5–35.7)
MCV: 81 fL (ref 79–97)
Platelets: 260 10*3/uL (ref 150–450)
RBC: 5.73 x10E6/uL (ref 4.14–5.80)
RDW: 16.5 % — ABNORMAL HIGH (ref 11.6–15.4)
WBC: 7.4 10*3/uL (ref 3.4–10.8)

## 2023-05-15 NOTE — Telephone Encounter (Signed)
Attempted to call [name] to set up home paramedicine visit- no answer. Left voicemail. I will continue to reach out.   [name], EMT-Paramedic [phone] 05/15/2023

## 2023-05-16 ENCOUNTER — Telehealth (HOSPITAL_COMMUNITY): Payer: Self-pay

## 2023-05-16 NOTE — Telephone Encounter (Signed)
Attempted to reach [name] for a paramedicine follow up and he did not answer- no option to leave a voicemail. I will continue to reach out.   [name], EMT-Paramedic [phone] 05/16/2023

## 2023-05-22 ENCOUNTER — Other Ambulatory Visit (HOSPITAL_COMMUNITY): Payer: Self-pay

## 2023-05-22 ENCOUNTER — Telehealth (HOSPITAL_COMMUNITY): Payer: Self-pay

## 2023-05-22 NOTE — Telephone Encounter (Signed)
[name] called and reports he has only one pen of Ozempic left. He gets this through pt assistance-how can we order refills or can you assist?  Message sent to [facility].   [name], EMT-Paramedic [phone] 05/22/2023

## 2023-05-22 NOTE — Progress Notes (Signed)
Paramedicine Encounter    Patient ID: [name], male    DOB: 1961-06-21, 62 y.o.   MRN: [id number]   Complaints- no complaints at present   Assessment- CAOX4, warm and dry ambulatory with no shortness of breath, no chest pain, no swelling, lungs clear, no weight gain.   Compliance with meds- some missed doses of meds over the last two weeks   Pill box filled- for two weeks   Refills needed-  Lasix Seroquel Ozempic IRON OTC   Meds changes since last visit- none     Social changes- none   Arrived for home visit for [name] who reports to be feeling good with no complaints today. He states he has been doing well. He has a few missed doses over the last two weeks. I obtained vitals and reviewed meds filling pill box for two weeks. We reviewed upcoming appointments. We discussed HF management and DM management. He is doing well with both. Home visit complete. I will see [name] is two weeks.   BP 122/74   Pulse 64   Resp 16   Wt 289 lb (131.1 kg)   SpO2 94%   BMI 37.11 kg/m  Weight yesterday-- didn't weigh  Last visit weight-- 291lbs    [name], EMT-Paramedic [phone]  ACTION: Home visit completed    Patient Care Team: Ivonne Andrew, NP as PCP - General (Pulmonary Disease) Quintella Reichert, MD as PCP - Cardiology (Cardiology) Laurey Morale, MD as PCP - Advanced Heart Failure (Cardiology) Lanier Prude, MD as PCP - Electrophysiology (Cardiology) Burna Sis, LCSW as Social Worker (Licensed Clinical Social Worker) Tressia Danas, MD as Consulting Physician (Gastroenterology)  Patient Active Problem List   Diagnosis Date Noted   Acute right-sided low back pain with right-sided sciatica 05/14/2023   Multiple joint pain 12/22/2022   Obesity (BMI 35.0-39.9 without comorbidity) 07/18/2022   Cellulitis 06/22/2022   Cellulitis, leg 06/20/2022   Presence of Watchman left atrial appendage closure device 10/28/2021   Atrial fibrillation  (HCC) 10/27/2021   Melena    Chronic diastolic CHF (congestive heart failure) (HCC)    GI bleed 06/29/2021   Severe anemia 06/29/2021   Adenomatous polyp of ascending colon    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   Acute blood loss anemia    Angiodysplasia of stomach    Chronic anticoagulation    CHF exacerbation (HCC) 06/15/2020   AKI (acute kidney injury) (HCC) 06/15/2020   CKD (chronic kidney disease), stage III (HCC) 06/15/2020   Anemia due to chronic blood loss    Gastric AVM    Angiodysplasia of duodenum with hemorrhage    Chronic systolic CHF (congestive heart failure) (HCC) 04/05/2020   Anemia 04/05/2020   PAF (paroxysmal atrial fibrillation) (HCC) 04/05/2020   OSA (obstructive sleep apnea) 04/05/2020   Syncope and collapse 04/05/2020   Type 2 diabetes mellitus with hyperglycemia, with long-term current use of insulin (HCC) 12/03/2019   Diabetic polyneuropathy associated with type 2 diabetes mellitus (HCC) 12/03/2019   Erectile dysfunction 12/03/2019   Paranoid schizophrenia, chronic condition (HCC) 01/26/2015   Severe recurrent major depressive disorder with psychotic features (HCC) 01/26/2015   GAD (generalized anxiety disorder) 01/26/2015   OCD (obsessive compulsive disorder) 01/26/2015   Panic disorder without agoraphobia 01/26/2015   Insomnia 01/26/2015    Current Outpatient Medications:    acetaminophen (TYLENOL) 500 MG tablet, Take 2 tablets (1,000 mg total) by mouth 2 (two) times daily as needed. (Patient taking differently: Take 1,000  mg by mouth 2 (two) times daily as needed.), Disp: 180 tablet, Rfl: 3   albuterol (VENTOLIN HFA) 108 (90 Base) MCG/ACT inhaler, Inhale 2 puffs into the lungs every 6 (six) hours as needed for wheezing or shortness of breath., Disp: 18 g, Rfl: 1   aspirin EC 81 MG tablet, Take 1 tablet (81 mg total) by mouth daily. Swallow whole., Disp: 90 tablet, Rfl: 3   atorvastatin (LIPITOR) 40 MG tablet, Take 1 tablet  (40 mg total) by mouth daily., Disp: 90 tablet, Rfl: 3   blood glucose meter kit and supplies KIT, Dispense based on patient and insurance preference. Use up to four times daily as directed. (FOR ICD-9 250.00, 250.01)., Disp: 1 each, Rfl: 0   colchicine 0.6 MG tablet, Daily for 7 days then daily PRN for gout flare, Disp: 30 tablet, Rfl: 0   Continuous Glucose Receiver (FREESTYLE LIBRE 3 READER) DEVI, 1 each by Does not apply route daily. Use to check glucose continuously, Disp: 1 each, Rfl: 1   Continuous Glucose Sensor (FREESTYLE LIBRE 3 SENSOR) MISC, Place 1 sensor on the skin every 14 days. Use to check glucose continuously, Disp: 6 each, Rfl: 2   diclofenac Sodium (VOLTAREN) 1 % GEL, Apply 2 g topically daily as needed for pain., Disp: , Rfl:    DULoxetine (CYMBALTA) 30 MG capsule, Take 1 capsule (30 mg total) by mouth daily., Disp: 30 capsule, Rfl: 2   empagliflozin (JARDIANCE) 10 MG TABS tablet, TAKE 1 TABLET BY MOUTH ONCE DAILY ., Disp: 90 tablet, Rfl: 3   ferrous sulfate 325 (65 FE) MG tablet, Take 1 tablet (325 mg total) by mouth every other day. (Patient taking differently: Take 325 mg by mouth daily with breakfast.), Disp: 30 tablet, Rfl: 1   furosemide (LASIX) 40 MG tablet, Take 1.5 tablets (60 mg total) by mouth 2 (two) times daily., Disp: 200 tablet, Rfl: 5   gabapentin (NEURONTIN) 800 MG tablet, Take 1 tablet (800 mg total) by mouth 3 (three) times daily., Disp: 90 tablet, Rfl: 5   HUMALOG MIX 75/25 KWIKPEN (75-25) 100 UNIT/ML KwikPen, DIAL AND INJECT 30 UNITS UNDER THE SKIN TWICE DAILY, Disp: 75 mL, Rfl: 0   metFORMIN (GLUCOPHAGE-XR) 500 MG 24 hr tablet, Take 1 tablet (500 mg total) by mouth 2 (two) times daily with a meal., Disp: 180 tablet, Rfl: 1   metoprolol succinate (TOPROL-XL) 50 MG 24 hr tablet, Take 1 tablet (50 mg total) by mouth in the morning and at bedtime. Take with or immediately following a meal., Disp: 180 tablet, Rfl: 2   Multiple Vitamins-Minerals (CENTRUM SILVER  50+MEN) TABS, Take 1 tablet by mouth daily., Disp: , Rfl:    mupirocin ointment (BACTROBAN) 2 %, Apply 1 Application topically 2 (two) times daily., Disp: 22 g, Rfl: 0   pantoprazole (PROTONIX) 40 MG tablet, Take 1 tablet by mouth once daily, Disp: 90 tablet, Rfl: 0   potassium chloride SA (KLOR-CON M) 20 MEQ tablet, Take 2 tablets (40 mEq total) by mouth daily., Disp: 60 tablet, Rfl: 6   QUEtiapine (SEROQUEL) 300 MG tablet, Take 1.5 tablets (450 mg total) by mouth at bedtime., Disp: 135 tablet, Rfl: 1   RELION PEN NEEDLES 31G X 6 MM MISC, INJECT 1 PEN INTO THE SKIN 2 TIMES DAILY, Disp: 100 each, Rfl: 0   sacubitril-valsartan (ENTRESTO) 24-26 MG, Take 1 tablet by mouth 2 (two) times daily., Disp: 180 tablet, Rfl: 3   Semaglutide, 2 MG/DOSE, (OZEMPIC, 2 MG/DOSE,) 8 MG/3ML SOPN, Inject  2 mg into the skin once a week. Monday, Disp: , Rfl:    sildenafil (VIAGRA) 100 MG tablet, Take 0.5-1 tablets (50-100 mg total) by mouth daily as needed for erectile dysfunction., Disp: 5 tablet, Rfl: 11   spironolactone (ALDACTONE) 25 MG tablet, Take 1 tablet (25 mg total) by mouth daily., Disp: 90 tablet, Rfl: 3   tadalafil (CIALIS) 20 MG tablet, Take 20 mg by mouth daily as needed., Disp: , Rfl:    Vitamin D, Ergocalciferol, (DRISDOL) 1.25 MG (50000 UNIT) CAPS capsule, Take 50,000 Units by mouth once a week., Disp: , Rfl:    zinc gluconate 50 MG tablet, Take 50 mg by mouth daily., Disp: , Rfl:    metolazone (ZAROXOLYN) 2.5 MG tablet, Take 1 tablet (2.5 mg total) by mouth as directed. (Patient not taking: Reported on 01/29/2023), Disp: 5 tablet, Rfl: 2   Oxycodone HCl 10 MG TABS, Take 10 mg by mouth 3 (three) times daily. (Patient not taking: Reported on 05/22/2023), Disp: , Rfl:  No Known Allergies   Social History   Socioeconomic History   Marital status: Legally Separated    Spouse name: Not on file   Number of children: Not on file   Years of education: Not on file   Highest education level: Not on file   Occupational History   Not on file  Tobacco Use   Smoking status: Never    Passive exposure: Never   Smokeless tobacco: Never  Vaping Use   Vaping Use: Never used  Substance and Sexual Activity   Alcohol use: Not Currently    Alcohol/week: 2.0 standard drinks of alcohol    Types: 2 Cans of beer per week   Drug use: No   Sexual activity: Yes    Birth control/protection: None  Other Topics Concern   Not on file  Social History Narrative   Not on file   Social Determinants of Health   Financial Resource Strain: Medium Risk (03/19/2023)   Overall Financial Resource Strain (CARDIA)    Difficulty of Paying Living Expenses: Somewhat hard  Food Insecurity: No Food Insecurity (07/01/2021)   Hunger Vital Sign    Worried About Running Out of Food in the Last Year: Never true    Ran Out of Food in the Last Year: Never true  Transportation Needs: Unmet Transportation Needs (07/01/2021)   PRAPARE - Administrator, Civil Service (Medical): Yes    Lack of Transportation (Non-Medical): Yes  Physical Activity: Not on file  Stress: Not on file  Social Connections: Not on file  Intimate Partner Violence: Not on file    Physical Exam      Future Appointments  Date Time Provider Department Center  05/24/2023  1:00 PM [facility]NUAL WELLNESS VISIT [facility] None  06/01/2023  7:40 AM [facility] DEVICE REMOTES [facility]  08/15/2023 10:20 AM Ivonne Andrew, NP [facility] None  09/03/2023  7:40 AM [facility] DEVICE REMOTES [facility]  12/04/2023  7:40 AM [facility] DEVICE REMOTES [facility]  03/05/2024  7:40 AM [facility] DEVICE REMOTES [facility]  06/05/2024  7:40 AM [facility] DEVICE REMOTES [facility]  09/05/2024  7:40 AM [facility] DEVICE REMOTES [facility]

## 2023-05-23 NOTE — Telephone Encounter (Signed)
I have a 1mg  pen here for him to pick up that came after his shipment of the 2mg  pens. His refills should refill automatically. I called the automated line and it said it was in the process. I will check back in a few days.  Called pt and LVM for him to call back.

## 2023-05-24 ENCOUNTER — Ambulatory Visit (INDEPENDENT_AMBULATORY_CARE_PROVIDER_SITE_OTHER): Payer: Medicare Other

## 2023-05-24 VITALS — Ht 74.0 in | Wt 298.0 lb

## 2023-05-24 DIAGNOSIS — E119 Type 2 diabetes mellitus without complications: Secondary | ICD-10-CM | POA: Diagnosis not present

## 2023-05-24 DIAGNOSIS — Z0000 Encounter for general adult medical examination without abnormal findings: Secondary | ICD-10-CM | POA: Diagnosis not present

## 2023-05-24 NOTE — Progress Notes (Signed)
 Subjective:   [name] is a 62 y.o. male who presents for an Initial Medicare Annual Wellness Visit.  Visit Complete: Virtual  I connected with  [name] on 05/24/23 by a audio enabled telemedicine application and verified that I am speaking with the correct person using two identifiers.  Patient Location: Home  Provider Location: Home Office  I discussed the limitations of evaluation and management by telemedicine. The patient expressed understanding and agreed to proceed.  Patient Medicare AWV questionnaire was completed by the patient on n/a; I have confirmed that all information answered by patient is correct and no changes since this date.  Review of Systems     Cardiac Risk Factors include: advanced age (>14men, >45 women);diabetes mellitus;dyslipidemia;hypertension;male gender;obesity (BMI >30kg/m2)     Objective:    Today's Vitals   05/24/23 1223 05/24/23 1224  Weight: 298 lb (135.2 kg)   Height: 6\' 2"  (1.88 m)   PainSc:  0-No pain   Body mass index is 38.26 kg/m.     05/24/2023   12:30 PM 06/20/2022    6:06 PM 05/11/2022    8:32 PM 12/12/2021   10:17 AM 10/27/2021   11:45 AM 07/01/2021    9:04 AM 05/18/2021    8:06 PM  Advanced Directives  Does Patient Have a Medical Advance Directive? No No No No No No No  Type of Theme park manager;Living will     Would patient like information on creating a medical advance directive? No - Patient declined No - Patient declined No - Patient declined Yes (MAU/Ambulatory/Procedural Areas - Information given)  No - Patient declined     Current Medications (verified) Outpatient Encounter Medications as of 05/24/2023  Medication Sig   acetaminophen (TYLENOL) 500 MG tablet Take 2 tablets (1,000 mg total) by mouth 2 (two) times daily as needed. (Patient taking differently: Take 1,000 mg by mouth 2 (two) times daily as needed.)   albuterol (VENTOLIN HFA) 108 (90 Base) MCG/ACT  inhaler Inhale 2 puffs into the lungs every 6 (six) hours as needed for wheezing or shortness of breath.   aspirin EC 81 MG tablet Take 1 tablet (81 mg total) by mouth daily. Swallow whole.   atorvastatin (LIPITOR) 40 MG tablet Take 1 tablet (40 mg total) by mouth daily.   blood glucose meter kit and supplies KIT Dispense based on patient and insurance preference. Use up to four times daily as directed. (FOR ICD-9 250.00, 250.01).   colchicine 0.6 MG tablet Daily for 7 days then daily PRN for gout flare   Continuous Glucose Receiver (FREESTYLE LIBRE 3 READER) DEVI 1 each by Does not apply route daily. Use to check glucose continuously   Continuous Glucose Sensor (FREESTYLE LIBRE 3 SENSOR) MISC Place 1 sensor on the skin every 14 days. Use to check glucose continuously   diclofenac Sodium (VOLTAREN) 1 % GEL Apply 2 g topically daily as needed for pain.   DULoxetine (CYMBALTA) 30 MG capsule Take 1 capsule (30 mg total) by mouth daily.   empagliflozin (JARDIANCE) 10 MG TABS tablet TAKE 1 TABLET BY MOUTH ONCE DAILY .   ferrous sulfate 325 (65 FE) MG tablet Take 1 tablet (325 mg total) by mouth every other day. (Patient taking differently: Take 325 mg by mouth daily with breakfast.)   furosemide (LASIX) 40 MG tablet Take 1.5 tablets (60 mg total) by mouth 2 (two) times daily.   gabapentin (NEURONTIN) 800 MG tablet Take 1 tablet (  800 mg total) by mouth 3 (three) times daily.   HUMALOG MIX 75/25 KWIKPEN (75-25) 100 UNIT/ML KwikPen DIAL AND INJECT 30 UNITS UNDER THE SKIN TWICE DAILY   metFORMIN (GLUCOPHAGE-XR) 500 MG 24 hr tablet Take 1 tablet (500 mg total) by mouth 2 (two) times daily with a meal.   metolazone (ZAROXOLYN) 2.5 MG tablet Take 1 tablet (2.5 mg total) by mouth as directed. (Patient not taking: Reported on 01/29/2023)   metoprolol succinate (TOPROL-XL) 50 MG 24 hr tablet Take 1 tablet (50 mg total) by mouth in the morning and at bedtime. Take with or immediately following a meal.   Multiple  Vitamins-Minerals (CENTRUM SILVER 50+MEN) TABS Take 1 tablet by mouth daily.   mupirocin ointment (BACTROBAN) 2 % Apply 1 Application topically 2 (two) times daily.   Oxycodone HCl 10 MG TABS Take 10 mg by mouth 3 (three) times daily. (Patient not taking: Reported on 05/22/2023)   pantoprazole (PROTONIX) 40 MG tablet Take 1 tablet by mouth once daily   potassium chloride SA (KLOR-CON M) 20 MEQ tablet Take 2 tablets (40 mEq total) by mouth daily.   QUEtiapine (SEROQUEL) 300 MG tablet Take 1.5 tablets (450 mg total) by mouth at bedtime.   RELION PEN NEEDLES 31G X 6 MM MISC INJECT 1 PEN INTO THE SKIN 2 TIMES DAILY   sacubitril-valsartan (ENTRESTO) 24-26 MG Take 1 tablet by mouth 2 (two) times daily.   Semaglutide, 2 MG/DOSE, (OZEMPIC, 2 MG/DOSE,) 8 MG/3ML SOPN Inject 2 mg into the skin once a week. Monday   sildenafil (VIAGRA) 100 MG tablet Take 0.5-1 tablets (50-100 mg total) by mouth daily as needed for erectile dysfunction.   spironolactone (ALDACTONE) 25 MG tablet Take 1 tablet (25 mg total) by mouth daily.   tadalafil (CIALIS) 20 MG tablet Take 20 mg by mouth daily as needed.   Vitamin D, Ergocalciferol, (DRISDOL) 1.25 MG (50000 UNIT) CAPS capsule Take 50,000 Units by mouth once a week.   zinc gluconate 50 MG tablet Take 50 mg by mouth daily.   No facility-administered encounter medications on file as of 05/24/2023.    Allergies (verified) Patient has no known allergies.   History: Past Medical History:  Diagnosis Date   AICD (automatic cardioverter/defibrillator) present    Anxiety    Atrial fibrillation (HCC)    CAD (coronary artery disease)    Cardiomyopathy (HCC)    CHF (congestive heart failure) (HCC) 09/2019   Depression    Diabetes mellitus    Erectile dysfunction 11/2019   GI bleeding    H/O right heart catheterization 09/2019   Hypertension    Presence of permanent cardiac pacemaker    Presence of Watchman left atrial appendage closure device 10/27/2021   27 mm Watchman  Flex Device per Dr. Lalla Brothers   Schizophrenia [facility])    Sleep apnea    uses cpap   Past Surgical History:  Procedure Laterality Date   BIOPSY  04/19/2021   Procedure: BIOPSY;  Surgeon: Sherrilyn Rist, MD;  Location: [facility] ENDOSCOPY;  Service: Gastroenterology;;   COLONOSCOPY WITH PROPOFOL N/A 04/16/2021   Procedure: COLONOSCOPY WITH PROPOFOL;  Surgeon: Tressia Danas, MD;  Location: [facility] ENDOSCOPY;  Service: Gastroenterology;  Laterality: N/A;   CORONARY STENT PLACEMENT     ENTEROSCOPY N/A 04/08/2020   Procedure: ENTEROSCOPY;  Surgeon: Sherrilyn Rist, MD;  Location: [facility] ENDOSCOPY;  Service: Gastroenterology;  Laterality: N/A;   ENTEROSCOPY N/A 06/17/2020   Procedure: ENTEROSCOPY;  Surgeon: Hilarie Fredrickson, MD;  Location:  [facility] ENDOSCOPY;  Service: Endoscopy;  Laterality: N/A;   ENTEROSCOPY N/A 04/15/2021   Procedure: ENTEROSCOPY;  Surgeon: Tressia Danas, MD;  Location: [facility] ENDOSCOPY;  Service: Gastroenterology;  Laterality: N/A;   ENTEROSCOPY N/A 04/19/2021   Procedure: ENTEROSCOPY;  Surgeon: Sherrilyn Rist, MD;  Location: [facility] ENDOSCOPY;  Service: Gastroenterology;  Laterality: N/A;   ENTEROSCOPY N/A 07/01/2021   Procedure: ENTEROSCOPY;  Surgeon: Beverley Fiedler, MD;  Location: [facility] ENDOSCOPY;  Service: Gastroenterology;  Laterality: N/A;   GIVENS CAPSULE STUDY  04/16/2021   Procedure: GIVENS CAPSULE STUDY;  Surgeon: Tressia Danas, MD;  Location: [facility] ENDOSCOPY;  Service: Gastroenterology;;   HEMOSTASIS CLIP PLACEMENT  04/08/2020   Procedure: HEMOSTASIS CLIP PLACEMENT;  Surgeon: Sherrilyn Rist, MD;  Location: [facility] ENDOSCOPY;  Service: Gastroenterology;;   HEMOSTASIS CLIP PLACEMENT  07/01/2021   Procedure: HEMOSTASIS CLIP PLACEMENT;  Surgeon: Beverley Fiedler, MD;  Location: [facility] ENDOSCOPY;  Service: Gastroenterology;;   HEMOSTASIS CONTROL  04/08/2020   Procedure: HEMOSTASIS CONTROL;  Surgeon: Sherrilyn Rist, MD;  Location: [facility] ENDOSCOPY;  Service: Gastroenterology;;   HEMOSTASIS CONTROL  06/17/2020    Procedure: HEMOSTASIS CONTROL;  Surgeon: Hilarie Fredrickson, MD;  Location: [facility] ENDOSCOPY;  Service: Endoscopy;;   HERNIA REPAIR     HOT HEMOSTASIS N/A 04/15/2021   Procedure: HOT HEMOSTASIS (ARGON PLASMA COAGULATION/BICAP);  Surgeon: Tressia Danas, MD;  Location: [facility] ENDOSCOPY;  Service: Gastroenterology;  Laterality: N/A;   HOT HEMOSTASIS N/A 07/01/2021   Procedure: HOT HEMOSTASIS (ARGON PLASMA COAGULATION/BICAP);  Surgeon: Beverley Fiedler, MD;  Location: [facility] ENDOSCOPY;  Service: Gastroenterology;  Laterality: N/A;   ICD IMPLANT N/A 09/03/2020   Procedure: ICD IMPLANT;  Surgeon: Lanier Prude, MD;  Location: [facility] INVASIVE CV LAB;  Service: Cardiovascular;  Laterality: N/A;   LEFT ATRIAL APPENDAGE OCCLUSION N/A 10/27/2021   Procedure: LEFT ATRIAL APPENDAGE OCCLUSION;  Surgeon: Lanier Prude, MD;  Location: [facility] INVASIVE CV LAB;  Service: Cardiovascular;  Laterality: N/A;   POLYPECTOMY  04/16/2021   Procedure: POLYPECTOMY;  Surgeon: Tressia Danas, MD;  Location: [facility] ENDOSCOPY;  Service: Gastroenterology;;   RIGHT/LEFT HEART CATH AND CORONARY ANGIOGRAPHY N/A 10/14/2019   Procedure: RIGHT/LEFT HEART CATH AND CORONARY ANGIOGRAPHY;  Surgeon: Lyn Records, MD;  Location: [facility] INVASIVE CV LAB;  Service: Cardiovascular;  Laterality: N/A;   SUBMUCOSAL TATTOO INJECTION  04/19/2021   Procedure: SUBMUCOSAL TATTOO INJECTION;  Surgeon: Sherrilyn Rist, MD;  Location: [facility] ENDOSCOPY;  Service: Gastroenterology;;   TEE WITHOUT CARDIOVERSION N/A 10/27/2021   Procedure: TRANSESOPHAGEAL ECHOCARDIOGRAM (TEE);  Surgeon: Lanier Prude, MD;  Location: [facility] INVASIVE CV LAB;  Service: Cardiovascular;  Laterality: N/A;   TEE WITHOUT CARDIOVERSION N/A 12/12/2021   Procedure: TRANSESOPHAGEAL ECHOCARDIOGRAM (TEE);  Surgeon: Sande Rives, MD;  Location: [facility] ENDOSCOPY;  Service: Cardiovascular;  Laterality: N/A;   Family History  Problem Relation Age of Onset   Heart failure Mother    Mental illness Sister     Mental illness Sister    Social History   Socioeconomic History   Marital status: Legally Separated    Spouse name: Not on file   Number of children: Not on file   Years of education: Not on file   Highest education level: Not on file  Occupational History   Not on file  Tobacco Use   Smoking status: Never    Passive exposure: Never   Smokeless tobacco: Never  Vaping Use   Vaping Use: Never used  Substance and Sexual Activity  Alcohol use: Not Currently    Alcohol/week: 2.0 standard drinks of alcohol    Types: 2 Cans of beer per week   Drug use: No   Sexual activity: Yes    Birth control/protection: None  Other Topics Concern   Not on file  Social History Narrative   Not on file   Social Determinants of Health   Financial Resource Strain: Medium Risk (05/24/2023)   Overall Financial Resource Strain (CARDIA)    Difficulty of Paying Living Expenses: Somewhat hard  Food Insecurity: No Food Insecurity (05/24/2023)   Hunger Vital Sign    Worried About Running Out of Food in the Last Year: Never true    Ran Out of Food in the Last Year: Never true  Transportation Needs: No Transportation Needs (05/24/2023)   PRAPARE - Administrator, Civil Service (Medical): No    Lack of Transportation (Non-Medical): No  Physical Activity: Sufficiently Active (05/24/2023)   Exercise Vital Sign    Days of Exercise per Week: 7 days    Minutes of Exercise per Session: 30 min  Stress: No Stress Concern Present (05/24/2023)   [employer] of Occupational Health - Occupational Stress Questionnaire    Feeling of Stress : Not at all  Social Connections: Moderately Isolated (05/24/2023)   Social Connection and Isolation Panel [NHANES]    Frequency of Communication with Friends and Family: More than three times a week    Frequency of Social Gatherings with Friends and Family: More than three times a week    Attends Religious Services: Never    Database administrator or  Organizations: No    Attends Engineer, structural: Never    Marital Status: Living with partner    Tobacco Counseling Counseling given: Yes   Clinical Intake:  Pre-visit preparation completed: Yes  Pain : No/denies pain Pain Score: 0-No pain     BMI - recorded: 38.26 Nutritional Status: BMI > 30  Obese Nutritional Risks: None Diabetes: Yes CBG done?: No (wears continous blood glucose monitor) Did pt. bring in CBG monitor from home?: No  How often do you need to have someone help you when you read instructions, pamphlets, or other written materials from your doctor or pharmacy?: 1 - Never  Interpreter Needed?: No  Information entered by ::  Taziah Difatta, CMA   Activities of Daily Living    05/24/2023   12:25 PM 06/21/2022    1:11 AM  In your present state of health, do you have any difficulty performing the following activities:  Hearing? 0 0  Vision? 1 0  Comment referral for an eye doctor placed today   Difficulty concentrating or making decisions? 0 0  Walking or climbing stairs? 0 0  Dressing or bathing? 0 0  Doing errands, shopping? 0 0  Preparing Food and eating ? N   Using the Toilet? N   In the past six months, have you accidently leaked urine? N   Do you have problems with loss of bowel control? N   Managing your Medications? N   Managing your Finances? N   Housekeeping or managing your Housekeeping? N     Patient Care Team: Ivonne Andrew, NP as PCP - General (Pulmonary Disease) Quintella Reichert, MD as PCP - Cardiology (Cardiology) Laurey Morale, MD as PCP - Advanced Heart Failure (Cardiology) Lanier Prude, MD as PCP - Electrophysiology (Cardiology) Burna Sis, LCSW as Social Worker (Licensed Clinical Social Worker) Tressia Danas, MD as  Consulting Physician (Gastroenterology)  Indicate any recent Medical Services you may have received from other than [facility] providers in the past year (date may be approximate).      Assessment:   This is a routine wellness examination for [place].  Hearing/Vision screen Hearing Screening - Comments:: Patient denies any hearing difficulties.    Dietary issues and exercise activities discussed:     Goals Addressed             This Visit's Progress    Patient Stated       Continue to lose weight        Depression Screen    05/24/2023   12:27 PM 05/14/2023   11:19 AM 02/26/2023    2:43 PM 02/05/2023   11:16 AM 01/19/2023    1:29 PM 02/02/2022   11:28 AM 11/04/2021   11:12 AM  PHQ 2/9 Scores  PHQ - 2 Score 0 0 0 0 2 0 0  PHQ- 9 Score    2 12      Fall Risk    05/24/2023   12:30 PM 02/26/2023    2:43 PM 01/19/2023    1:29 PM 02/02/2022   11:27 AM 11/04/2021   11:12 AM  Fall Risk   Falls in the past year? 0 1 1 0 0  Number falls in past yr: 0 1 1 0 0  Injury with Fall? 0 0 0 0 0  Risk for fall due to : No Fall Risks   No Fall Risks   Follow up Falls prevention discussed   Falls evaluation completed     MEDICARE RISK AT HOME:  Medicare Risk at Home - 05/24/23 1230     Any stairs in or around the home? Yes    If so, are there any without handrails? No    Home free of loose throw rugs in walkways, pet beds, electrical cords, etc? Yes    Adequate lighting in your home to reduce risk of falls? Yes    Life alert? No    Use of a cane, walker or w/c? Yes    Grab bars in the bathroom? No    Shower chair or bench in shower? No    Elevated toilet seat or a handicapped toilet? No             TIMED UP AND GO:  Was the test performed? No    Cognitive Function:        05/24/2023   12:30 PM  6CIT Screen  What Year? 0 points  What month? 0 points  What time? 0 points  Count back from 20 0 points  Months in reverse 0 points  Repeat phrase 0 points  Total Score 0 points    Immunizations Immunization History  Administered Date(s) Administered   Influenza,inj,Quad PF,6+ Mos 10/13/2019, 09/13/2020, 08/03/2021, 11/01/2022   PFIZER(Purple  Top)SARS-COV-2 Vaccination 03/13/2020   Pneumococcal Polysaccharide-23 10/16/2019   Tdap 09/13/2020   Zoster Recombinat (Shingrix) 08/03/2021    TDAP status: Up to date  Flu Vaccine status: Up to date  Pneumococcal vaccine status: NOT AGE APPROPRIATE FOR THIS PATIENT  Covid-19 vaccine status: Information provided on how to obtain vaccines.   Qualifies for Shingles Vaccine? Yes   Zostavax completed No   Shingrix Completed?: No.    Education has been provided regarding the importance of this vaccine. Patient has been advised to call insurance company to determine out of pocket expense if they have not yet received this vaccine. Advised may  also receive vaccine at local pharmacy or Health Dept. Verbalized acceptance and understanding.  Screening Tests Health Maintenance  Topic Date Due   Medicare Annual Wellness (AWV)  Never done   OPHTHALMOLOGY EXAM  Never done   COVID-19 Vaccine (2 - Pfizer risk series) 04/03/2020   Zoster Vaccines- Shingrix (2 of 2) 09/28/2021   FOOT EXAM  04/01/2022   INFLUENZA VACCINE  06/28/2023   HEMOGLOBIN A1C  11/13/2023   Diabetic kidney evaluation - Urine ACR  02/05/2024   Colonoscopy  04/16/2024   Diabetic kidney evaluation - eGFR measurement  05/13/2024   DTaP/Tdap/Td (2 - Td or Tdap) 09/13/2030   Hepatitis C Screening  Completed   HIV Screening  Completed   HPV VACCINES  Aged Out    Health Maintenance  Health Maintenance Due  Topic Date Due   Medicare Annual Wellness (AWV)  Never done   OPHTHALMOLOGY EXAM  Never done   COVID-19 Vaccine (2 - Pfizer risk series) 04/03/2020   Zoster Vaccines- Shingrix (2 of 2) 09/28/2021   FOOT EXAM  04/01/2022    Colorectal cancer screening: Type of screening: Colonoscopy. Completed 04/16/21. Repeat every 3 years  Lung Cancer Screening: (Low Dose CT Chest recommended if Age 13-80 years, 20 pack-year currently smoking OR have quit w/in 15years.) does not qualify.   Additional Screening:  Hepatitis C  Screening: does not qualify; Completed 11/08/2021  Vision Screening: Recommended annual ophthalmology exams for early detection of glaucoma and other disorders of the eye. Is the patient up to date with their annual eye exam?  No  Who is the provider or what is the name of the office in which the patient attends annual eye exams? Referral placed If pt is not established with a provider, would they like to be referred to a provider to establish care? Yes .   Dental Screening: Recommended annual dental exams for proper oral hygiene  Diabetic Foot Exam: Diabetic Foot Exam: Overdue, Pt has been advised about the importance in completing this exam. Pt is scheduled for diabetic foot exam on patient given the number to [facility] to schedule appt.  Community Resource Referral / Chronic Care Management: CRR required this visit?  No   CCM required this visit?  No    Plan:     I have personally reviewed and noted the following in the patient's chart:   Medical and social history Use of alcohol, tobacco or illicit drugs  Current medications and supplements including opioid prescriptions. Patient is currently taking opioid prescriptions. Information provided to patient regarding non-opioid alternatives. Patient advised to discuss non-opioid treatment plan with their provider. Functional ability and status Nutritional status Physical activity Advanced directives List of other physicians Hospitalizations, surgeries, and ER visits in previous 12 months Vitals Screenings to include cognitive, depression, and falls Referrals and appointments  In addition, I have reviewed and discussed with patient certain preventive protocols, quality metrics, and best practice recommendations. A written personalized care plan for preventive services as well as general preventive health recommendations were provided to patient.   Any medications not marked as taking were not mentioned by the patient (or their caregiver  if applicable) when reconciling the medications.  Because this visit was a virtual/telehealth visit,  certain criteria was not obtained, such a blood pressure, CBG if patient is a diabetic, and timed up and go.     Jordan Hawks Mac Dowdell, CMA   05/24/2023   After Visit Summary: (MyChart) Due to this being a telephonic visit, the after visit  summary with patients personalized plan was offered to patient via MyChart   Nurse Notes: opthalmology referral placed.

## 2023-05-24 NOTE — Patient Instructions (Addendum)
[name] , Thank you for taking time to come for your Medicare Wellness Visit. I appreciate your ongoing commitment to your health goals. Please review the following plan we discussed and let me know if I can assist you in the future.   These are the goals we discussed:  Goals      Patient Stated     Continue to lose weight         This is a list of the screening recommended for you and due dates:  Health Maintenance  Topic Date Due   Eye exam for diabetics  Never done   COVID-19 Vaccine (2 - Pfizer risk series) 04/03/2020   Zoster (Shingles) Vaccine (2 of 2) 09/28/2021   Complete foot exam   04/01/2022   Flu Shot  06/28/2023   Hemoglobin A1C  11/13/2023   Yearly kidney health urinalysis for diabetes  02/05/2024   Colon Cancer Screening  04/16/2024   Yearly kidney function blood test for diabetes  05/13/2024   Medicare Annual Wellness Visit  05/23/2024   DTaP/Tdap/Td vaccine (2 - Td or Tdap) 09/13/2030   Hepatitis C Screening  Completed   HIV Screening  Completed   HPV Vaccine  Aged Out    Advanced directives: Advance directive discussed with you today. Even though you declined this today, please call our office should you change your mind, and we can give you the proper paperwork for you to fill out. Advance care planning is a way to make decisions about medical care that fits your values in case you are ever unable to make these decisions for yourself.  Information on Advanced Care Planning can be found at [facility] of [place] Advance Health Care Directives Advance Health Care Directives ([email or web address]    Conditions/risks identified: Aim for 30 minutes of exercise or brisk walking, 6-8 glasses of water, and 5 servings of fruits and vegetables each day.  You have been referred to [facility] for a complete eye exam. If you haven't heard from them in a few days, please call them to schedule your appointment.  [facility] [address] [place]  [address] Phone: [phone]   Managing Pain Without Opioids Opioids are strong medicines used to treat moderate to severe pain. For some people, especially those who have long-term (chronic) pain, opioids may not be the best choice for pain management due to: Side effects like nausea, constipation, and sleepiness. The risk of addiction (opioid use disorder). The longer you take opioids, the greater your risk of addiction. Pain that lasts for more than 3 months is called chronic pain. Managing chronic pain usually requires more than one approach and is often provided by a team of health care providers working together (multidisciplinary approach). Pain management may be done at a pain management center or pain clinic. How to manage pain without the use of opioids Use non-opioid medicines Non-opioid medicines for pain may include: Over-the-counter or prescription non-steroidal anti-inflammatory drugs (NSAIDs). These may be the first medicines used for pain. They work well for muscle and bone pain, and they reduce swelling. Acetaminophen. This over-the-counter medicine may work well for milder pain but not swelling. Antidepressants. These may be used to treat chronic pain. A certain type of antidepressant (tricyclics) is often used. These medicines are given in lower doses for pain than when used for depression. Anticonvulsants. These are usually used to treat seizures but may also reduce nerve (neuropathic) pain. Muscle relaxants. These relieve pain caused  by sudden muscle tightening (spasms). You may also use a pain medicine that is applied to the skin as a patch, cream, or gel (topical analgesic), such as a numbing medicine. These may cause fewer side effects than medicines taken by mouth. Do certain therapies as directed Some therapies can help with pain management. They include: Physical therapy. You will do exercises to gain strength and flexibility. A physical therapist may teach you exercises  to move and stretch parts of your body that are weak, stiff, or painful. You can learn these exercises at physical therapy visits and practice them at home. Physical therapy may also involve: Massage. Heat wraps or applying heat or cold to affected areas. Electrical signals that interrupt pain signals (transcutaneous electrical nerve stimulation, TENS). Weak lasers that reduce pain and swelling (low-level laser therapy). Signals from your body that help you learn to regulate pain (biofeedback). Occupational therapy. This helps you to learn ways to function at home and work with less pain. Recreational therapy. This involves trying new activities or hobbies, such as a physical activity or drawing. Mental health therapy, including: Cognitive behavioral therapy (CBT). This helps you learn coping skills for dealing with pain. Acceptance and commitment therapy (ACT) to change the way you think and react to pain. Relaxation therapies, including muscle relaxation exercises and mindfulness-based stress reduction. Pain management counseling. This may be individual, family, or group counseling.  Receive medical treatments Medical treatments for pain management include: Nerve block injections. These may include a pain blocker and anti-inflammatory medicines. You may have injections: Near the spine to relieve chronic back or neck pain. Into joints to relieve back or joint pain. Into nerve areas that supply a painful area to relieve body pain. Into muscles (trigger point injections) to relieve some painful muscle conditions. A medical device placed near your spine to help block pain signals and relieve nerve pain or chronic back pain (spinal cord stimulation device). Acupuncture. Follow these instructions at home Medicines Take over-the-counter and prescription medicines only as told by your health care provider. If you are taking pain medicine, ask your health care providers about possible side effects  to watch out for. Do not drive or use heavy machinery while taking prescription opioid pain medicine. Lifestyle  Do not use drugs or alcohol to reduce pain. If you drink alcohol, limit how much you have to: 0-1 drink a day for women who are not pregnant. 0-2 drinks a day for men. Know how much alcohol is in a drink. In the [place] one drink equals one 12 oz bottle of beer (355 mL), one 5 oz glass of wine (148 mL), or one 1 oz glass of hard liquor (44 mL). Do not use any products that contain nicotine or tobacco. These products include cigarettes, chewing tobacco, and vaping devices, such as e-cigarettes. If you need help quitting, ask your health care provider. Eat a healthy diet and maintain a healthy weight. Poor diet and excess weight may make pain worse. Eat foods that are high in fiber. These include fresh fruits and vegetables, whole grains, and beans. Limit foods that are high in fat and processed sugars, such as fried and sweet foods. Exercise regularly. Exercise lowers stress and may help relieve pain. Ask your health care provider what activities and exercises are safe for you. If your health care provider approves, join an exercise class that combines movement and stress reduction. Examples include yoga and tai chi. Get enough sleep. Lack of sleep may make pain worse. Lower stress  as much as possible. Practice stress reduction techniques as told by your therapist. General instructions Work with all your pain management providers to find the treatments that work best for you. You are an important member of your pain management team. There are many things you can do to reduce pain on your own. Consider joining an online or in-person support group for people who have chronic pain. Keep all follow-up visits. This is important. Where to find more information You can find more information about managing pain without opioids from: American Academy of Pain Medicine: painmed.org Institute  for Chronic Pain: instituteforchronicpain.org American Chronic Pain Association: theacpa.org Contact a health care provider if: You have side effects from pain medicine. Your pain gets worse or does not get better with treatments or home therapy. You are struggling with anxiety or depression. Summary Many types of pain can be managed without opioids. Chronic pain may respond better to pain management without opioids. Pain is best managed when you and a team of health care providers work together. Pain management without opioids may include non-opioid medicines, medical treatments, physical therapy, mental health therapy, and lifestyle changes. Tell your health care providers if your pain gets worse or is not being managed well enough. This information is not intended to replace advice given to you by your health care provider. Make sure you discuss any questions you have with your health care provider. Document Revised: 02/23/2021 Document Reviewed: 02/23/2021 Elsevier Patient Education  2024 Elsevier Inc.   Next appointment: VIRTUAL/ TELEPHONE VISIT Follow up in one year for your annual wellness visit  May 29, 2024 at 1pm telephone visit   Preventive Care 40-64 Years, Male Preventive care refers to lifestyle choices and visits with your health care provider that can promote health and wellness. What does preventive care include? A yearly physical exam. This is also called an annual well check. Dental exams once or twice a year. Routine eye exams. Ask your health care provider how often you should have your eyes checked. Personal lifestyle choices, including: Daily care of your teeth and gums. Regular physical activity. Eating a healthy diet. Avoiding tobacco and drug use. Limiting alcohol use. Practicing safe sex. Taking low-dose aspirin every day starting at age 15. What happens during an annual well check? The services and screenings done by your health care provider during  your annual well check will depend on your age, overall health, lifestyle risk factors, and family history of disease. Counseling  Your health care provider may ask you questions about your: Alcohol use. Tobacco use. Drug use. Emotional well-being. Home and relationship well-being. Sexual activity. Eating habits. Work and work Astronomer. Screening  You may have the following tests or measurements: Height, weight, and BMI. Blood pressure. Lipid and cholesterol levels. These may be checked every 5 years, or more frequently if you are over 28 years old. Skin check. Lung cancer screening. You may have this screening every year starting at age 25 if you have a 30-pack-year history of smoking and currently smoke or have quit within the past 15 years. Fecal occult blood test (FOBT) of the stool. You may have this test every year starting at age 73. Flexible sigmoidoscopy or colonoscopy. You may have a sigmoidoscopy every 5 years or a colonoscopy every 10 years starting at age 73. Prostate cancer screening. Recommendations will vary depending on your family history and other risks. Hepatitis C blood test. Hepatitis B blood test. Sexually transmitted disease (STD) testing. Diabetes screening. This is done by checking  your blood sugar (glucose) after you have not eaten for a while (fasting). You may have this done every 1-3 years. Discuss your test results, treatment options, and if necessary, the need for more tests with your health care provider. Vaccines  Your health care provider may recommend certain vaccines, such as: Influenza vaccine. This is recommended every year. Tetanus, diphtheria, and acellular pertussis (Tdap, Td) vaccine. You may need a Td booster every 10 years. Zoster vaccine. You may need this after age 47. Pneumococcal 13-valent conjugate (PCV13) vaccine. You may need this if you have certain conditions and have not been vaccinated. Pneumococcal polysaccharide (PPSV23)  vaccine. You may need one or two doses if you smoke cigarettes or if you have certain conditions. Talk to your health care provider about which screenings and vaccines you need and how often you need them. This information is not intended to replace advice given to you by your health care provider. Make sure you discuss any questions you have with your health care provider. Document Released: 12/10/2015 Document Revised: 08/02/2016 Document Reviewed: 09/14/2015 Elsevier Interactive Patient Education  2017 [employer].  Fall Prevention in the Home Falls can cause injuries. They can happen to people of all ages. There are many things you can do to make your home safe and to help prevent falls. What can I do on the outside of my home? Regularly fix the edges of walkways and driveways and fix any cracks. Remove anything that might make you trip as you walk through a door, such as a raised step or threshold. Trim any bushes or trees on the path to your home. Use bright outdoor lighting. Clear any walking paths of anything that might make someone trip, such as rocks or tools. Regularly check to see if handrails are loose or broken. Make sure that both sides of any steps have handrails. Any raised decks and porches should have guardrails on the edges. Have any leaves, snow, or ice cleared regularly. Use sand or salt on walking paths during winter. Clean up any spills in your garage right away. This includes oil or grease spills. What can I do in the bathroom? Use night lights. Install grab bars by the toilet and in the tub and shower. Do not use towel bars as grab bars. Use non-skid mats or decals in the tub or shower. If you need to sit down in the shower, use a plastic, non-slip stool. Keep the floor dry. Clean up any water that spills on the floor as soon as it happens. Remove soap buildup in the tub or shower regularly. Attach bath mats securely with double-sided non-slip rug tape. Do not  have throw rugs and other things on the floor that can make you trip. What can I do in the bedroom? Use night lights. Make sure that you have a light by your bed that is easy to reach. Do not use any sheets or blankets that are too big for your bed. They should not hang down onto the floor. Have a firm chair that has side arms. You can use this for support while you get dressed. Do not have throw rugs and other things on the floor that can make you trip. What can I do in the kitchen? Clean up any spills right away. Avoid walking on wet floors. Keep items that you use a lot in easy-to-reach places. If you need to reach something above you, use a strong step stool that has a grab bar. Keep electrical cords out  of the way. Do not use floor polish or wax that makes floors slippery. If you must use wax, use non-skid floor wax. Do not have throw rugs and other things on the floor that can make you trip. What can I do with my stairs? Do not leave any items on the stairs. Make sure that there are handrails on both sides of the stairs and use them. Fix handrails that are broken or loose. Make sure that handrails are as long as the stairways. Check any carpeting to make sure that it is firmly attached to the stairs. Fix any carpet that is loose or worn. Avoid having throw rugs at the top or bottom of the stairs. If you do have throw rugs, attach them to the floor with carpet tape. Make sure that you have a light switch at the top of the stairs and the bottom of the stairs. If you do not have them, ask someone to add them for you. What else can I do to help prevent falls? Wear shoes that: Do not have high heels. Have rubber bottoms. Are comfortable and fit you well. Are closed at the toe. Do not wear sandals. If you use a stepladder: Make sure that it is fully opened. Do not climb a closed stepladder. Make sure that both sides of the stepladder are locked into place. Ask someone to hold it for  you, if possible. Clearly mark and make sure that you can see: Any grab bars or handrails. First and last steps. Where the edge of each step is. Use tools that help you move around (mobility aids) if they are needed. These include: Canes. Walkers. Scooters. Crutches. Turn on the lights when you go into a dark area. Replace any light bulbs as soon as they burn out. Set up your furniture so you have a clear path. Avoid moving your furniture around. If any of your floors are uneven, fix them. If there are any pets around you, be aware of where they are. Review your medicines with your doctor. Some medicines can make you feel dizzy. This can increase your chance of falling. Ask your doctor what other things that you can do to help prevent falls. This information is not intended to replace advice given to you by your health care provider. Make sure you discuss any questions you have with your health care provider. Document Released: 09/09/2009 Document Revised: 04/20/2016 Document Reviewed: 12/18/2014 Elsevier Interactive Patient Education  2017 [employer].

## 2023-05-25 ENCOUNTER — Telehealth: Payer: Self-pay | Admitting: Pharmacist

## 2023-05-25 ENCOUNTER — Encounter: Payer: Self-pay | Admitting: Pharmacist

## 2023-05-25 NOTE — Telephone Encounter (Signed)
Called [facility]. Per their automated machine patients Ozempic 2mg  pens have shipped via [employer].  Called pt and left detailed message on machine.

## 2023-05-25 NOTE — Telephone Encounter (Signed)
Pt's Ozempic 2mg  has been delivered from pt assistance. Called pt to make him aware, he did not answer and his voicemail is full so I am unable to leave a message. I have sent him a [employer].

## 2023-05-29 NOTE — Telephone Encounter (Signed)
Called pt to let him know that his Ozempic is here for pick up. Left VM for pt

## 2023-06-01 ENCOUNTER — Ambulatory Visit: Payer: Medicare Other

## 2023-06-01 DIAGNOSIS — I5032 Chronic diastolic (congestive) heart failure: Secondary | ICD-10-CM

## 2023-06-01 LAB — CUP PACEART REMOTE DEVICE CHECK
Battery Voltage: 3.1 V
Brady Statistic RV Percent Paced: 0 %
Date Time Interrogation Session: 20240705091736
HighPow Impedance: 86 Ohm
Implantable Lead Connection Status: 753985
Implantable Lead Implant Date: 20211008
Implantable Lead Location: 753860
Implantable Lead Model: 436910
Implantable Lead Serial Number: 81404997
Implantable Pulse Generator Implant Date: 20211008
Lead Channel Impedance Value: 481 Ohm
Lead Channel Pacing Threshold Amplitude: 0.4 V
Lead Channel Pacing Threshold Pulse Width: 0.4 ms
Lead Channel Sensing Intrinsic Amplitude: 18.5 mV
Lead Channel Sensing Intrinsic Amplitude: 2.1 mV
Lead Channel Setting Pacing Amplitude: 2 V
Lead Channel Setting Pacing Pulse Width: 0.4 ms
Lead Channel Setting Sensing Sensitivity: 0.8 mV
Pulse Gen Model: 429525
Pulse Gen Serial Number: 84810752
Zone Setting Status: 755011

## 2023-06-05 ENCOUNTER — Other Ambulatory Visit (HOSPITAL_COMMUNITY): Payer: Self-pay

## 2023-06-05 NOTE — Progress Notes (Signed)
Paramedicine Encounter    Patient ID: [name], male    DOB: September 03, 1961, 62 y.o.   MRN: [id number]   Complaints- chronic hip and leg pain   Assessment- CAOX4, warm and dry, cooking a bologna sandwich, no shortness of breath, complains of chronic hip and leg pain. No chest pain, no dizziness, no swelling.   Compliance with meds- 3 missed night doses   Pill box filled- for one week   Refills needed- none   Meds changes since last visit- none     Social changes- none   Requests- asking if he can switch to a different cpap device due to feeling claustrophobic- I will reach out to sleep doctors.     BP 130/78   Pulse 86   Resp 16   Wt 287 lb (130.2 kg)   SpO2 96%   BMI 36.85 kg/m  Weight yesterday-- didn't weigh  Last visit weight-- 289lbs    Arrived for home visit for [name] who was making lunch in the kitchen reporting to be feeling okay just having his chronic leg and hip pain- he reports the duloxetine and gabapentin aren't helping. He admits to using some of his wife's oxycodones sometimes for pain relief. He says that helps him best. I called and got him an appointment with pain clinic for follow up. I reviewed meds and confirmed same filling pill box for one week. Vitals and assessment as noted. NO lower leg swelling, lungs clear. We reviewed upcoming appointments and confirmed same. I plan to see [name] in one week. He agreed with plans.   [name], EMT-Paramedic [phone]  ACTION: Home visit completed    Patient Care Team: Ivonne Andrew, NP as PCP - General (Pulmonary Disease) Quintella Reichert, MD as PCP - Cardiology (Cardiology) Laurey Morale, MD as PCP - Advanced Heart Failure (Cardiology) Lanier Prude, MD as PCP - Electrophysiology (Cardiology) Burna Sis, LCSW as Social Worker (Licensed Clinical Social Worker) Tressia Danas, MD as Consulting Physician (Gastroenterology)  Patient Active Problem List   Diagnosis Date  Noted   Acute right-sided low back pain with right-sided sciatica 05/14/2023   Multiple joint pain 12/22/2022   Obesity (BMI 35.0-39.9 without comorbidity) 07/18/2022   Cellulitis 06/22/2022   Cellulitis, leg 06/20/2022   Presence of Watchman left atrial appendage closure device 10/28/2021   Atrial fibrillation (HCC) 10/27/2021   Melena    Chronic diastolic CHF (congestive heart failure) (HCC)    GI bleed 06/29/2021   Severe anemia 06/29/2021   Adenomatous polyp of ascending colon    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   Acute blood loss anemia    Angiodysplasia of stomach    Chronic anticoagulation    CHF exacerbation (HCC) 06/15/2020   AKI (acute kidney injury) (HCC) 06/15/2020   CKD (chronic kidney disease), stage III (HCC) 06/15/2020   Anemia due to chronic blood loss    Gastric AVM    Angiodysplasia of duodenum with hemorrhage    Chronic systolic CHF (congestive heart failure) (HCC) 04/05/2020   Anemia 04/05/2020   PAF (paroxysmal atrial fibrillation) (HCC) 04/05/2020   OSA (obstructive sleep apnea) 04/05/2020   Syncope and collapse 04/05/2020   Type 2 diabetes mellitus with hyperglycemia, with long-term current use of insulin (HCC) 12/03/2019   Diabetic polyneuropathy associated with type 2 diabetes mellitus (HCC) 12/03/2019   Erectile dysfunction 12/03/2019   Paranoid schizophrenia, chronic condition (HCC) 01/26/2015   Severe recurrent major depressive disorder with psychotic features (HCC) 01/26/2015  GAD (generalized anxiety disorder) 01/26/2015   OCD (obsessive compulsive disorder) 01/26/2015   Panic disorder without agoraphobia 01/26/2015   Insomnia 01/26/2015    Current Outpatient Medications:    acetaminophen (TYLENOL) 500 MG tablet, Take 2 tablets (1,000 mg total) by mouth 2 (two) times daily as needed. (Patient taking differently: Take 1,000 mg by mouth 2 (two) times daily as needed.), Disp: 180 tablet, Rfl: 3   albuterol (VENTOLIN  HFA) 108 (90 Base) MCG/ACT inhaler, Inhale 2 puffs into the lungs every 6 (six) hours as needed for wheezing or shortness of breath., Disp: 18 g, Rfl: 1   aspirin EC 81 MG tablet, Take 1 tablet (81 mg total) by mouth daily. Swallow whole., Disp: 90 tablet, Rfl: 3   atorvastatin (LIPITOR) 40 MG tablet, Take 1 tablet (40 mg total) by mouth daily., Disp: 90 tablet, Rfl: 3   blood glucose meter kit and supplies KIT, Dispense based on patient and insurance preference. Use up to four times daily as directed. (FOR ICD-9 250.00, 250.01)., Disp: 1 each, Rfl: 0   Continuous Glucose Receiver (FREESTYLE LIBRE 3 READER) DEVI, 1 each by Does not apply route daily. Use to check glucose continuously, Disp: 1 each, Rfl: 1   Continuous Glucose Sensor (FREESTYLE LIBRE 3 SENSOR) MISC, Place 1 sensor on the skin every 14 days. Use to check glucose continuously, Disp: 6 each, Rfl: 2   diclofenac Sodium (VOLTAREN) 1 % GEL, Apply 2 g topically daily as needed for pain., Disp: , Rfl:    DULoxetine (CYMBALTA) 30 MG capsule, Take 1 capsule (30 mg total) by mouth daily., Disp: 30 capsule, Rfl: 2   empagliflozin (JARDIANCE) 10 MG TABS tablet, TAKE 1 TABLET BY MOUTH ONCE DAILY ., Disp: 90 tablet, Rfl: 3   ferrous sulfate 325 (65 FE) MG tablet, Take 1 tablet (325 mg total) by mouth every other day. (Patient taking differently: Take 325 mg by mouth daily with breakfast.), Disp: 30 tablet, Rfl: 1   furosemide (LASIX) 40 MG tablet, Take 1.5 tablets (60 mg total) by mouth 2 (two) times daily., Disp: 200 tablet, Rfl: 5   gabapentin (NEURONTIN) 800 MG tablet, Take 1 tablet (800 mg total) by mouth 3 (three) times daily., Disp: 90 tablet, Rfl: 5   HUMALOG MIX 75/25 KWIKPEN (75-25) 100 UNIT/ML KwikPen, DIAL AND INJECT 30 UNITS UNDER THE SKIN TWICE DAILY, Disp: 75 mL, Rfl: 0   metFORMIN (GLUCOPHAGE-XR) 500 MG 24 hr tablet, Take 1 tablet (500 mg total) by mouth 2 (two) times daily with a meal., Disp: 180 tablet, Rfl: 1   metoprolol succinate  (TOPROL-XL) 50 MG 24 hr tablet, Take 1 tablet (50 mg total) by mouth in the morning and at bedtime. Take with or immediately following a meal., Disp: 180 tablet, Rfl: 2   Multiple Vitamins-Minerals (CENTRUM SILVER 50+MEN) TABS, Take 1 tablet by mouth daily., Disp: , Rfl:    mupirocin ointment (BACTROBAN) 2 %, Apply 1 Application topically 2 (two) times daily., Disp: 22 g, Rfl: 0   Oxycodone HCl 10 MG TABS, Take 10 mg by mouth 3 (three) times daily., Disp: , Rfl:    pantoprazole (PROTONIX) 40 MG tablet, Take 1 tablet by mouth once daily, Disp: 90 tablet, Rfl: 0   potassium chloride SA (KLOR-CON M) 20 MEQ tablet, Take 2 tablets (40 mEq total) by mouth daily., Disp: 60 tablet, Rfl: 6   QUEtiapine (SEROQUEL) 300 MG tablet, Take 1.5 tablets (450 mg total) by mouth at bedtime., Disp: 135 tablet, Rfl: 1  RELION PEN NEEDLES 31G X 6 MM MISC, INJECT 1 PEN INTO THE SKIN 2 TIMES DAILY, Disp: 100 each, Rfl: 0   sacubitril-valsartan (ENTRESTO) 24-26 MG, Take 1 tablet by mouth 2 (two) times daily., Disp: 180 tablet, Rfl: 3   Semaglutide, 2 MG/DOSE, (OZEMPIC, 2 MG/DOSE,) 8 MG/3ML SOPN, Inject 2 mg into the skin once a week. Monday, Disp: , Rfl:    sildenafil (VIAGRA) 100 MG tablet, Take 0.5-1 tablets (50-100 mg total) by mouth daily as needed for erectile dysfunction., Disp: 5 tablet, Rfl: 11   spironolactone (ALDACTONE) 25 MG tablet, Take 1 tablet (25 mg total) by mouth daily., Disp: 90 tablet, Rfl: 3   tadalafil (CIALIS) 20 MG tablet, Take 20 mg by mouth daily as needed., Disp: , Rfl:    Vitamin D, Ergocalciferol, (DRISDOL) 1.25 MG (50000 UNIT) CAPS capsule, Take 50,000 Units by mouth once a week., Disp: , Rfl:    colchicine 0.6 MG tablet, Daily for 7 days then daily PRN for gout flare (Patient not taking: Reported on 06/05/2023), Disp: 30 tablet, Rfl: 0   metolazone (ZAROXOLYN) 2.5 MG tablet, Take 1 tablet (2.5 mg total) by mouth as directed. (Patient not taking: Reported on 01/29/2023), Disp: 5 tablet, Rfl: 2    zinc gluconate 50 MG tablet, Take 50 mg by mouth daily. (Patient not taking: Reported on 06/05/2023), Disp: , Rfl:  No Known Allergies   Social History   Socioeconomic History   Marital status: Legally Separated    Spouse name: Not on file   Number of children: Not on file   Years of education: Not on file   Highest education level: Not on file  Occupational History   Not on file  Tobacco Use   Smoking status: Never    Passive exposure: Never   Smokeless tobacco: Never  Vaping Use   Vaping Use: Never used  Substance and Sexual Activity   Alcohol use: Not Currently    Alcohol/week: 2.0 standard drinks of alcohol    Types: 2 Cans of beer per week   Drug use: No   Sexual activity: Yes    Birth control/protection: None  Other Topics Concern   Not on file  Social History Narrative   Not on file   Social Determinants of Health   Financial Resource Strain: Medium Risk (05/24/2023)   Overall Financial Resource Strain (CARDIA)    Difficulty of Paying Living Expenses: Somewhat hard  Food Insecurity: No Food Insecurity (05/24/2023)   Hunger Vital Sign    Worried About Running Out of Food in the Last Year: Never true    Ran Out of Food in the Last Year: Never true  Transportation Needs: No Transportation Needs (05/24/2023)   PRAPARE - Administrator, Civil Service (Medical): No    Lack of Transportation (Non-Medical): No  Physical Activity: Sufficiently Active (05/24/2023)   Exercise Vital Sign    Days of Exercise per Week: 7 days    Minutes of Exercise per Session: 30 min  Stress: No Stress Concern Present (05/24/2023)   [employer] of Occupational Health - Occupational Stress Questionnaire    Feeling of Stress : Not at all  Social Connections: Moderately Isolated (05/24/2023)   Social Connection and Isolation Panel [NHANES]    Frequency of Communication with Friends and Family: More than three times a week    Frequency of Social Gatherings with Friends and  Family: More than three times a week    Attends Religious Services: Never  Active Member of Clubs or Organizations: No    Attends Banker Meetings: Never    Marital Status: Living with partner  Intimate Partner Violence: Not At Risk (05/24/2023)   Humiliation, Afraid, Rape, and Kick questionnaire    Fear of Current or Ex-Partner: No    Emotionally Abused: No    Physically Abused: No    Sexually Abused: No    Physical Exam      Future Appointments  Date Time Provider Department Center  06/18/2023  3:20 PM Fanny Dance, MD [facility] CPR  08/15/2023 10:20 AM Ivonne Andrew, NP [facility] None  09/03/2023  7:40 AM [facility] DEVICE REMOTES [facility]  12/04/2023  7:40 AM [facility] DEVICE REMOTES [facility]  03/05/2024  7:40 AM [facility] DEVICE REMOTES [facility]  05/29/2024  1:00 PM [facility]-ANNUAL WELLNESS VISIT [facility] None  06/05/2024  7:40 AM [facility] DEVICE REMOTES [facility]  09/05/2024  7:40 AM [facility] DEVICE REMOTES [facility]

## 2023-06-07 ENCOUNTER — Telehealth (HOSPITAL_COMMUNITY): Payer: Self-pay | Admitting: Licensed Clinical Social Worker

## 2023-06-07 NOTE — Telephone Encounter (Signed)
H&V Care Navigation CSW Progress Note  Clinical Social Worker received request from paramedic for [facility] V[facility] Food bag to alleviate food insecurity.  Not normally a concerns for pt but needs some assistance at this time- food  bag provided to paramedic who will take to pt.   SDOH Screenings   Food Insecurity: Food Insecurity Present (06/07/2023)  Housing: Low Risk  (05/24/2023)  Transportation Needs: No Transportation Needs (05/24/2023)  Utilities: Not At Risk (05/24/2023)  Alcohol Screen: Low Risk  (05/24/2023)  Depression (PHQ2-9): Low Risk  (05/24/2023)  Financial Resource Strain: Medium Risk (05/24/2023)  Physical Activity: Sufficiently Active (05/24/2023)  Social Connections: Moderately Isolated (05/24/2023)  Stress: No Stress Concern Present (05/24/2023)  Tobacco Use: Low Risk  (05/24/2023)    Burna Sis, LCSW Clinical Social Worker [facility] Desk#: [phone] Cell#: [phone]

## 2023-06-12 ENCOUNTER — Other Ambulatory Visit (HOSPITAL_COMMUNITY): Payer: Self-pay | Admitting: Emergency Medicine

## 2023-06-12 NOTE — Progress Notes (Signed)
Paramedicine Encounter    Patient ID: [name], male    DOB: [birth year], 62 y.o.   MRN: [id number]   Compliance with meds - missed 3x night time doses, 1 morning dose and 1 metolozone.   Pill box filled - for 2x weeks    Refills needed -  atorvastatin, duloxetine, gabapentin, Ferrous sulfate, pantoprazole, potassium, quetapine. Entresto, vitamin D.   Meds changes since last visit - None.     Social changes - none    There were no vitals taken for this visit.  Met with [name] in his home today to have a med rec for 2x weeks. Pt denied any new complaints today. Pt was made aware of upcoming appointments and refills needed prior to our next visit. Med box was filled x2 weeks. Will follow up with [name] in 2x weeks.   Patient Care Team: Ivonne Andrew, NP as PCP - General (Pulmonary Disease) Quintella Reichert, MD as PCP - Cardiology (Cardiology) Laurey Morale, MD as PCP - Advanced Heart Failure (Cardiology) Lanier Prude, MD as PCP - Electrophysiology (Cardiology) Burna Sis, LCSW as Social Worker (Licensed Clinical Social Worker) Tressia Danas, MD as Consulting Physician (Gastroenterology)  Patient Active Problem List   Diagnosis Date Noted   Acute right-sided low back pain with right-sided sciatica 05/14/2023   Multiple joint pain 12/22/2022   Obesity (BMI 35.0-39.9 without comorbidity) 07/18/2022   Cellulitis 06/22/2022   Cellulitis, leg 06/20/2022   Presence of Watchman left atrial appendage closure device 10/28/2021   Atrial fibrillation (HCC) 10/27/2021   Melena    Chronic diastolic CHF (congestive heart failure) (HCC)    GI bleed 06/29/2021   Severe anemia 06/29/2021   Adenomatous polyp of ascending colon    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   Acute blood loss anemia    Angiodysplasia of stomach    Chronic anticoagulation    CHF exacerbation (HCC) 06/15/2020   AKI (acute kidney injury) (HCC) 06/15/2020    CKD (chronic kidney disease), stage III (HCC) 06/15/2020   Anemia due to chronic blood loss    Gastric AVM    Angiodysplasia of duodenum with hemorrhage    Chronic systolic CHF (congestive heart failure) (HCC) 04/05/2020   Anemia 04/05/2020   PAF (paroxysmal atrial fibrillation) (HCC) 04/05/2020   OSA (obstructive sleep apnea) 04/05/2020   Syncope and collapse 04/05/2020   Type 2 diabetes mellitus with hyperglycemia, with long-term current use of insulin (HCC) 12/03/2019   Diabetic polyneuropathy associated with type 2 diabetes mellitus (HCC) 12/03/2019   Erectile dysfunction 12/03/2019   Paranoid schizophrenia, chronic condition (HCC) 01/26/2015   Severe recurrent major depressive disorder with psychotic features (HCC) 01/26/2015   GAD (generalized anxiety disorder) 01/26/2015   OCD (obsessive compulsive disorder) 01/26/2015   Panic disorder without agoraphobia 01/26/2015   Insomnia 01/26/2015    Current Outpatient Medications:    acetaminophen (TYLENOL) 500 MG tablet, Take 2 tablets (1,000 mg total) by mouth 2 (two) times daily as needed. (Patient taking differently: Take 1,000 mg by mouth 2 (two) times daily as needed.), Disp: 180 tablet, Rfl: 3   albuterol (VENTOLIN HFA) 108 (90 Base) MCG/ACT inhaler, Inhale 2 puffs into the lungs every 6 (six) hours as needed for wheezing or shortness of breath., Disp: 18 g, Rfl: 1   aspirin EC 81 MG tablet, Take 1 tablet (81 mg total) by mouth daily. Swallow whole., Disp: 90 tablet, Rfl: 3   atorvastatin (LIPITOR) 40 MG tablet, Take 1  tablet (40 mg total) by mouth daily., Disp: 90 tablet, Rfl: 3   blood glucose meter kit and supplies KIT, Dispense based on patient and insurance preference. Use up to four times daily as directed. (FOR ICD-9 250.00, 250.01)., Disp: 1 each, Rfl: 0   colchicine 0.6 MG tablet, Daily for 7 days then daily PRN for gout flare (Patient not taking: Reported on 06/05/2023), Disp: 30 tablet, Rfl: 0   Continuous Glucose Receiver  (FREESTYLE LIBRE 3 READER) DEVI, 1 each by Does not apply route daily. Use to check glucose continuously, Disp: 1 each, Rfl: 1   Continuous Glucose Sensor (FREESTYLE LIBRE 3 SENSOR) MISC, Place 1 sensor on the skin every 14 days. Use to check glucose continuously, Disp: 6 each, Rfl: 2   diclofenac Sodium (VOLTAREN) 1 % GEL, Apply 2 g topically daily as needed for pain., Disp: , Rfl:    DULoxetine (CYMBALTA) 30 MG capsule, Take 1 capsule (30 mg total) by mouth daily., Disp: 30 capsule, Rfl: 2   empagliflozin (JARDIANCE) 10 MG TABS tablet, TAKE 1 TABLET BY MOUTH ONCE DAILY ., Disp: 90 tablet, Rfl: 3   ferrous sulfate 325 (65 FE) MG tablet, Take 1 tablet (325 mg total) by mouth every other day. (Patient taking differently: Take 325 mg by mouth daily with breakfast.), Disp: 30 tablet, Rfl: 1   furosemide (LASIX) 40 MG tablet, Take 1.5 tablets (60 mg total) by mouth 2 (two) times daily., Disp: 200 tablet, Rfl: 5   gabapentin (NEURONTIN) 800 MG tablet, Take 1 tablet (800 mg total) by mouth 3 (three) times daily., Disp: 90 tablet, Rfl: 5   HUMALOG MIX 75/25 KWIKPEN (75-25) 100 UNIT/ML KwikPen, DIAL AND INJECT 30 UNITS UNDER THE SKIN TWICE DAILY, Disp: 75 mL, Rfl: 0   metFORMIN (GLUCOPHAGE-XR) 500 MG 24 hr tablet, Take 1 tablet (500 mg total) by mouth 2 (two) times daily with a meal., Disp: 180 tablet, Rfl: 1   metolazone (ZAROXOLYN) 2.5 MG tablet, Take 1 tablet (2.5 mg total) by mouth as directed. (Patient not taking: Reported on 01/29/2023), Disp: 5 tablet, Rfl: 2   metoprolol succinate (TOPROL-XL) 50 MG 24 hr tablet, Take 1 tablet (50 mg total) by mouth in the morning and at bedtime. Take with or immediately following a meal., Disp: 180 tablet, Rfl: 2   Multiple Vitamins-Minerals (CENTRUM SILVER 50+MEN) TABS, Take 1 tablet by mouth daily., Disp: , Rfl:    mupirocin ointment (BACTROBAN) 2 %, Apply 1 Application topically 2 (two) times daily., Disp: 22 g, Rfl: 0   Oxycodone HCl 10 MG TABS, Take 10 mg by mouth 3  (three) times daily. (Patient not taking: Reported on 06/05/2023), Disp: , Rfl:    pantoprazole (PROTONIX) 40 MG tablet, Take 1 tablet by mouth once daily, Disp: 90 tablet, Rfl: 0   potassium chloride SA (KLOR-CON M) 20 MEQ tablet, Take 2 tablets (40 mEq total) by mouth daily., Disp: 60 tablet, Rfl: 6   QUEtiapine (SEROQUEL) 300 MG tablet, Take 1.5 tablets (450 mg total) by mouth at bedtime., Disp: 135 tablet, Rfl: 1   RELION PEN NEEDLES 31G X 6 MM MISC, INJECT 1 PEN INTO THE SKIN 2 TIMES DAILY, Disp: 100 each, Rfl: 0   sacubitril-valsartan (ENTRESTO) 24-26 MG, Take 1 tablet by mouth 2 (two) times daily., Disp: 180 tablet, Rfl: 3   Semaglutide, 2 MG/DOSE, (OZEMPIC, 2 MG/DOSE,) 8 MG/3ML SOPN, Inject 2 mg into the skin once a week. Monday, Disp: , Rfl:    sildenafil (VIAGRA) 100 MG  tablet, Take 0.5-1 tablets (50-100 mg total) by mouth daily as needed for erectile dysfunction., Disp: 5 tablet, Rfl: 11   spironolactone (ALDACTONE) 25 MG tablet, Take 1 tablet (25 mg total) by mouth daily., Disp: 90 tablet, Rfl: 3   tadalafil (CIALIS) 20 MG tablet, Take 20 mg by mouth daily as needed., Disp: , Rfl:    Vitamin D, Ergocalciferol, (DRISDOL) 1.25 MG (50000 UNIT) CAPS capsule, Take 50,000 Units by mouth once a week., Disp: , Rfl:    zinc gluconate 50 MG tablet, Take 50 mg by mouth daily. (Patient not taking: Reported on 06/05/2023), Disp: , Rfl:  No Known Allergies   Social History   Socioeconomic History   Marital status: Legally Separated    Spouse name: Not on file   Number of children: Not on file   Years of education: Not on file   Highest education level: Not on file  Occupational History   Not on file  Tobacco Use   Smoking status: Never    Passive exposure: Never   Smokeless tobacco: Never  Vaping Use   Vaping status: Never Used  Substance and Sexual Activity   Alcohol use: Not Currently    Alcohol/week: 2.0 standard drinks of alcohol    Types: 2 Cans of beer per week   Drug use: No    Sexual activity: Yes    Birth control/protection: None  Other Topics Concern   Not on file  Social History Narrative   Not on file   Social Determinants of Health   Financial Resource Strain: Medium Risk (05/24/2023)   Overall Financial Resource Strain (CARDIA)    Difficulty of Paying Living Expenses: Somewhat hard  Food Insecurity: Food Insecurity Present (06/07/2023)   Hunger Vital Sign    Worried About Running Out of Food in the Last Year: Sometimes true    Ran Out of Food in the Last Year: Sometimes true  Transportation Needs: No Transportation Needs (05/24/2023)   PRAPARE - Administrator, Civil Service (Medical): No    Lack of Transportation (Non-Medical): No  Physical Activity: Sufficiently Active (05/24/2023)   Exercise Vital Sign    Days of Exercise per Week: 7 days    Minutes of Exercise per Session: 30 min  Stress: No Stress Concern Present (05/24/2023)   [employer] of Occupational Health - Occupational Stress Questionnaire    Feeling of Stress : Not at all  Social Connections: Moderately Isolated (05/24/2023)   Social Connection and Isolation Panel [NHANES]    Frequency of Communication with Friends and Family: More than three times a week    Frequency of Social Gatherings with Friends and Family: More than three times a week    Attends Religious Services: Never    Database administrator or Organizations: No    Attends Banker Meetings: Never    Marital Status: Living with partner  Intimate Partner Violence: Not At Risk (05/24/2023)   Humiliation, Afraid, Rape, and Kick questionnaire    Fear of Current or Ex-Partner: No    Emotionally Abused: No    Physically Abused: No    Sexually Abused: No    Physical Exam      Future Appointments  Date Time Provider Department Center  06/18/2023  3:20 PM Fanny Dance, MD [facility] CPR  08/15/2023 10:20 AM Ivonne Andrew, NP [facility] None  09/03/2023  7:40 AM [facility] DEVICE REMOTES  [facility]  12/04/2023  7:40 AM [facility] DEVICE REMOTES [facility]  03/05/2024  7:40 AM [facility] DEVICE REMOTES [facility]  05/29/2024  1:00 PM SCC-ANNUAL WELLNESS VISIT [facility] None  06/05/2024  7:40 AM [facility] DEVICE REMOTES [facility]  09/05/2024  7:40 AM [facility] DEVICE REMOTES [facility]       ACTION: Home visit completed  [name] EMT-P Community Paramedic  [phone]  [phone] 06/12/23

## 2023-06-14 ENCOUNTER — Other Ambulatory Visit: Payer: Self-pay | Admitting: Nurse Practitioner

## 2023-06-14 MED ORDER — POTASSIUM CHLORIDE CRYS ER 20 MEQ PO TBCR: 40.0000 meq | EXTENDED_RELEASE_TABLET | Freq: Every day | ORAL | 6 refills | Status: DC

## 2023-06-14 MED ORDER — VITAMIN D (ERGOCALCIFEROL) 1.25 MG (50000 UNIT) PO CAPS: 50000.0000 [IU] | ORAL_CAPSULE | ORAL | 2 refills | Status: DC

## 2023-06-14 NOTE — Progress Notes (Signed)
[facility] refill sent

## 2023-06-14 NOTE — Addendum Note (Signed)
Addended by: Theresia Bough on: 06/14/2023 03:01 PM   Modules accepted: Orders

## 2023-06-18 ENCOUNTER — Encounter: Payer: Medicare Other | Admitting: Physical Medicine & Rehabilitation

## 2023-06-20 ENCOUNTER — Other Ambulatory Visit (HOSPITAL_COMMUNITY): Payer: Self-pay | Admitting: Family Medicine

## 2023-06-20 ENCOUNTER — Other Ambulatory Visit: Payer: Self-pay | Admitting: Physical Medicine & Rehabilitation

## 2023-06-20 NOTE — Progress Notes (Signed)
Remote ICD transmission.   

## 2023-06-20 NOTE — Telephone Encounter (Signed)
Appt 8/26

## 2023-06-25 ENCOUNTER — Telehealth (HOSPITAL_COMMUNITY): Payer: Self-pay | Admitting: Emergency Medicine

## 2023-06-25 NOTE — Telephone Encounter (Signed)
Spoke to [employer] about the medication [employer] and [place]. He reports he  was able to pick up at [facility] as Craig Staggers was able to speak to them and work out his grant.   [name], EMT-Paramedic [phone] 06/25/2023

## 2023-06-25 NOTE — Telephone Encounter (Signed)
Received a phone call from [name] today advising that he went to pick up his medications, but his grant had expired. [name] said that his Sherryll Burger would cost upwards of $400+ and requested we reinstate his grant. Please let me know when this is completed so he can go pick up same.   Thanks so much!  [name] EMT-P Community Paramedic  [phone]

## 2023-06-26 ENCOUNTER — Telehealth: Payer: Self-pay | Admitting: Cardiology

## 2023-06-26 ENCOUNTER — Other Ambulatory Visit (HOSPITAL_COMMUNITY): Payer: Self-pay | Admitting: Emergency Medicine

## 2023-06-26 NOTE — Telephone Encounter (Signed)
[name] with Dr. Alford Highland office called in on behalf of patient.  [name] reports patient can no longer tolerate CPAP mask.   He needs follow up and new order.  Call may be returned to the patient to discuss next steps.

## 2023-06-26 NOTE — Progress Notes (Signed)
Paramedicine Encounter    Patient ID: [name], male    DOB: 30-Nov-1960, 62 y.o.   MRN: [id number]   Complaints- reports having some nausea, vomiting and diarrhea X2 days earlier this week- says, "I took Linzess to clear out my system".  (His wife's prescription)   Assessment- CAOX4, warm and dry ambulatory with no shortness of breath, dizziness, chest pain, swelling or weight gain. Vitals obtained as noted. Lungs clear.   Compliance with meds- missed two bedtime, three noon doses of medications.   Pill box filled- for two weeks.   Refills needed- metoprolol (OTC IRON and Tylenol)   Meds changes since last visit- Lexapro 10mg  daily per psychiatrist Dr. Lauris Chroman      Social changes- none    VISIT SUMMARY Arrived for home visit for [name] who reports to be feeling okay but reports he had an episode of possible food poisoning over the last two days with nausea and vomiting so he took his wifes Linzess three times yesterday to help "clean him out". He says he is feeling much better now with no complaints. I expressed to him the importance of taking his own medications and to only use what is prescribed to him. He verbalized understating. Vitals obtained and as noted. We reviewed his medications and filled pill boxes for two weeks. Refills recorded as noted. We reviewed upcoming appointments. I also discussed with him the importance of hydration when having vomiting and diarrhea. He understood same. HF and DM education provided. He is wearing his freestyle 3 and using same to monitor his blood sugar. He is using the Ozempic and Insulin as well. No complications from these. He is expressing needing a new sleep apnea machine that isn't liking the full face mask- he reports he doesn't think he can tolerate a nose piece either. I will send message to [facility] office for same.   Home visit complete. We will plan to see [name] in two weeks. He agreed with plan.    BP (!) 140/72    Pulse 80   Resp 16   Wt 282 lb (127.9 kg)   SpO2 95%   BMI 36.21 kg/m  Weight yesterday-- didn't weight  Last visit weight-- 282lbs      ACTION: Home visit completed     Patient Care Team: Ivonne Andrew, NP as PCP - General (Pulmonary Disease) Quintella Reichert, MD as PCP - Cardiology (Cardiology) Laurey Morale, MD as PCP - Advanced Heart Failure (Cardiology) Lanier Prude, MD as PCP - Electrophysiology (Cardiology) Burna Sis, LCSW as Social Worker (Licensed Clinical Social Worker) Tressia Danas, MD as Consulting Physician (Gastroenterology)  Patient Active Problem List   Diagnosis Date Noted   Acute right-sided low back pain with right-sided sciatica 05/14/2023   Multiple joint pain 12/22/2022   Obesity (BMI 35.0-39.9 without comorbidity) 07/18/2022   Cellulitis 06/22/2022   Cellulitis, leg 06/20/2022   Presence of Watchman left atrial appendage closure device 10/28/2021   Atrial fibrillation (HCC) 10/27/2021   Melena    Chronic diastolic CHF (congestive heart failure) (HCC)    GI bleed 06/29/2021   Severe anemia 06/29/2021   Adenomatous polyp of ascending colon    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   Acute blood loss anemia    Angiodysplasia of stomach    Chronic anticoagulation    CHF exacerbation (HCC) 06/15/2020   AKI (acute kidney injury) (HCC) 06/15/2020   CKD (chronic kidney disease), stage III (HCC) 06/15/2020  Anemia due to chronic blood loss    Gastric AVM    Angiodysplasia of duodenum with hemorrhage    Chronic systolic CHF (congestive heart failure) (HCC) 04/05/2020   Anemia 04/05/2020   PAF (paroxysmal atrial fibrillation) (HCC) 04/05/2020   OSA (obstructive sleep apnea) 04/05/2020   Syncope and collapse 04/05/2020   Type 2 diabetes mellitus with hyperglycemia, with long-term current use of insulin (HCC) 12/03/2019   Diabetic polyneuropathy associated with type 2 diabetes mellitus (HCC) 12/03/2019    Erectile dysfunction 12/03/2019   Paranoid schizophrenia, chronic condition (HCC) 01/26/2015   Severe recurrent major depressive disorder with psychotic features (HCC) 01/26/2015   GAD (generalized anxiety disorder) 01/26/2015   OCD (obsessive compulsive disorder) 01/26/2015   Panic disorder without agoraphobia 01/26/2015   Insomnia 01/26/2015    Current Outpatient Medications:    acetaminophen (TYLENOL) 500 MG tablet, Take 2 tablets (1,000 mg total) by mouth 2 (two) times daily as needed. (Patient taking differently: Take 1,000 mg by mouth 2 (two) times daily as needed. Takes 1000mg  morning and night.), Disp: 180 tablet, Rfl: 3   albuterol (VENTOLIN HFA) 108 (90 Base) MCG/ACT inhaler, Inhale 2 puffs into the lungs every 6 (six) hours as needed for wheezing or shortness of breath., Disp: 18 g, Rfl: 1   aspirin EC 81 MG tablet, Take 1 tablet (81 mg total) by mouth daily. Swallow whole., Disp: 90 tablet, Rfl: 3   atorvastatin (LIPITOR) 40 MG tablet, Take 1 tablet (40 mg total) by mouth daily., Disp: 90 tablet, Rfl: 3   blood glucose meter kit and supplies KIT, Dispense based on patient and insurance preference. Use up to four times daily as directed. (FOR ICD-9 250.00, 250.01)., Disp: 1 each, Rfl: 0   Continuous Glucose Receiver (FREESTYLE LIBRE 3 READER) DEVI, 1 each by Does not apply route daily. Use to check glucose continuously, Disp: 1 each, Rfl: 1   Continuous Glucose Sensor (FREESTYLE LIBRE 3 SENSOR) MISC, Place 1 sensor on the skin every 14 days. Use to check glucose continuously, Disp: 6 each, Rfl: 2   diclofenac Sodium (VOLTAREN) 1 % GEL, Apply 2 g topically daily as needed for pain., Disp: , Rfl:    DULoxetine (CYMBALTA) 30 MG capsule, Take 1 capsule by mouth once daily, Disp: 30 capsule, Rfl: 2   empagliflozin (JARDIANCE) 10 MG TABS tablet, TAKE 1 TABLET BY MOUTH ONCE DAILY ., Disp: 90 tablet, Rfl: 3   colchicine 0.6 MG tablet, Daily for 7 days then daily PRN for gout flare (Patient not  taking: Reported on 06/05/2023), Disp: 30 tablet, Rfl: 0   ferrous sulfate 325 (65 FE) MG tablet, Take 1 tablet (325 mg total) by mouth every other day. (Patient not taking: Reported on 06/26/2023), Disp: 30 tablet, Rfl: 1   furosemide (LASIX) 40 MG tablet, Take 1.5 tablets (60 mg total) by mouth 2 (two) times daily., Disp: 200 tablet, Rfl: 5   gabapentin (NEURONTIN) 800 MG tablet, Take 1 tablet (800 mg total) by mouth 3 (three) times daily., Disp: 90 tablet, Rfl: 5   HUMALOG MIX 75/25 KWIKPEN (75-25) 100 UNIT/ML KwikPen, DIAL AND INJECT 30 UNITS UNDER THE SKIN TWICE DAILY, Disp: 75 mL, Rfl: 0   metFORMIN (GLUCOPHAGE-XR) 500 MG 24 hr tablet, Take 1 tablet (500 mg total) by mouth 2 (two) times daily with a meal., Disp: 180 tablet, Rfl: 1   metolazone (ZAROXOLYN) 2.5 MG tablet, Take 1 tablet (2.5 mg total) by mouth as directed. (Patient not taking: Reported on 01/29/2023), Disp: 5  tablet, Rfl: 2   metoprolol succinate (TOPROL-XL) 50 MG 24 hr tablet, Take 1 tablet (50 mg total) by mouth in the morning and at bedtime. Take with or immediately following a meal., Disp: 180 tablet, Rfl: 2   Multiple Vitamins-Minerals (CENTRUM SILVER 50+MEN) TABS, Take 1 tablet by mouth daily., Disp: , Rfl:    mupirocin ointment (BACTROBAN) 2 %, Apply 1 Application topically 2 (two) times daily. (Patient not taking: Reported on 06/12/2023), Disp: 22 g, Rfl: 0   Omega-3 Fatty Acids (FISH OIL) 1000 MG CAPS, Take 1,000 mg by mouth at bedtime., Disp: , Rfl:    Oxycodone HCl 10 MG TABS, Take 10 mg by mouth 3 (three) times daily. (Patient not taking: Reported on 06/05/2023), Disp: , Rfl:    pantoprazole (PROTONIX) 40 MG tablet, Take 1 tablet by mouth once daily, Disp: 90 tablet, Rfl: 0   potassium chloride SA (KLOR-CON M) 20 MEQ tablet, Take 2 tablets (40 mEq total) by mouth daily., Disp: 60 tablet, Rfl: 6   QUEtiapine (SEROQUEL) 300 MG tablet, Take 1.5 tablets (450 mg total) by mouth at bedtime., Disp: 135 tablet, Rfl: 1   RELION PEN  NEEDLES 31G X 6 MM MISC, INJECT 1 PEN INTO THE SKIN 2 TIMES DAILY, Disp: 100 each, Rfl: 0   sacubitril-valsartan (ENTRESTO) 24-26 MG, Take 1 tablet by mouth 2 (two) times daily. NEEDS FOLLOW UP APPOINTMENT FOR MORE REFILLS, Disp: 180 tablet, Rfl: 0   Semaglutide, 2 MG/DOSE, (OZEMPIC, 2 MG/DOSE,) 8 MG/3ML SOPN, Inject 2 mg into the skin once a week. Monday, Disp: , Rfl:    sildenafil (VIAGRA) 100 MG tablet, Take 0.5-1 tablets (50-100 mg total) by mouth daily as needed for erectile dysfunction., Disp: 5 tablet, Rfl: 11   spironolactone (ALDACTONE) 25 MG tablet, Take 1 tablet (25 mg total) by mouth daily., Disp: 90 tablet, Rfl: 3   tadalafil (CIALIS) 20 MG tablet, Take 20 mg by mouth daily as needed., Disp: , Rfl:    Vitamin D, Ergocalciferol, (DRISDOL) 1.25 MG (50000 UNIT) CAPS capsule, Take 1 capsule (50,000 Units total) by mouth once a week., Disp: 5 capsule, Rfl: 2   zinc gluconate 50 MG tablet, Take 50 mg by mouth daily. (Patient not taking: Reported on 06/05/2023), Disp: , Rfl:  No Known Allergies   Social History   Socioeconomic History   Marital status: Legally Separated    Spouse name: Not on file   Number of children: Not on file   Years of education: Not on file   Highest education level: Not on file  Occupational History   Not on file  Tobacco Use   Smoking status: Never    Passive exposure: Never   Smokeless tobacco: Never  Vaping Use   Vaping status: Never Used  Substance and Sexual Activity   Alcohol use: Not Currently    Alcohol/week: 2.0 standard drinks of alcohol    Types: 2 Cans of beer per week   Drug use: No   Sexual activity: Yes    Birth control/protection: None  Other Topics Concern   Not on file  Social History Narrative   Not on file   Social Determinants of Health   Financial Resource Strain: Medium Risk (05/24/2023)   Overall Financial Resource Strain (CARDIA)    Difficulty of Paying Living Expenses: Somewhat hard  Food Insecurity: Food Insecurity  Present (06/07/2023)   Hunger Vital Sign    Worried About Running Out of Food in the Last Year: Sometimes true  Ran Out of Food in the Last Year: Sometimes true  Transportation Needs: No Transportation Needs (05/24/2023)   PRAPARE - Administrator, Civil Service (Medical): No    Lack of Transportation (Non-Medical): No  Physical Activity: Sufficiently Active (05/24/2023)   Exercise Vital Sign    Days of Exercise per Week: 7 days    Minutes of Exercise per Session: 30 min  Stress: No Stress Concern Present (05/24/2023)   [employer] of Occupational Health - Occupational Stress Questionnaire    Feeling of Stress : Not at all  Social Connections: Moderately Isolated (05/24/2023)   Social Connection and Isolation Panel [NHANES]    Frequency of Communication with Friends and Family: More than three times a week    Frequency of Social Gatherings with Friends and Family: More than three times a week    Attends Religious Services: Never    Database administrator or Organizations: No    Attends Banker Meetings: Never    Marital Status: Living with partner  Intimate Partner Violence: Not At Risk (05/24/2023)   Humiliation, Afraid, Rape, and Kick questionnaire    Fear of Current or Ex-Partner: No    Emotionally Abused: No    Physically Abused: No    Sexually Abused: No    Physical Exam      Future Appointments  Date Time Provider Department Center  07/23/2023 10:40 AM Fanny Dance, MD [facility] CPR  08/15/2023 10:20 AM Ivonne Andrew, NP [facility] None  09/03/2023  7:40 AM [facility] DEVICE REMOTES [facility]  12/04/2023  7:40 AM [facility] DEVICE REMOTES [facility]  03/05/2024  7:40 AM [facility] DEVICE REMOTES [facility]  05/29/2024  1:00 PM [facility]-ANNUAL WELLNESS VISIT [facility] None  06/05/2024  7:40 AM [facility] DEVICE REMOTES [facility]  09/05/2024  7:40 AM [facility] DEVICE REMOTES [facility]

## 2023-07-10 ENCOUNTER — Other Ambulatory Visit (HOSPITAL_COMMUNITY): Payer: Self-pay | Admitting: Emergency Medicine

## 2023-07-10 NOTE — Progress Notes (Signed)
Pt rescheduled for tomorrow for home visit. Will follow up with new encounter.   [name] EMT-P Community Paramedic  [phone]

## 2023-07-11 ENCOUNTER — Other Ambulatory Visit (HOSPITAL_COMMUNITY): Payer: Self-pay

## 2023-07-11 NOTE — Progress Notes (Signed)
Paramedicine Encounter    Patient ID: [name], male    DOB: 04-01-1961, 62 y.o.   MRN: [id number]   Complaints-hot and nauseated earlier while working on car in the heat but came inside and felt better   Edema-none   Compliance with meds-missed 4 full days   Pill box filled-yes X 2 wks  If so, by whom-paramedic   Refills needed-metoprolol in 2nd pill box in the PM slot only-I left note for him to add that when he picks up from pharmacy  Refills needed to be called in next week- Metformin Seroquel  Potassium  Metoprolol-if he didn't get already  Duloxetine Vit  d Lasix  escitalopram    His weight is up 7lbs from 2 wks ago, he missed 4 full days of meds though. He denied any complaints, no sob, no dizziness, no c/p. He was outside working in the heat on his car and he got hot and nauseated but came inside and cooled down and felt better.  No edema noted.  Meds verified and 2 wks pill boxes refilled. He is missing metoprolol in 2nd wk PM dose only-I left a note on there for him, per heather him and his wife are able to make small med changes.      CBG freestyle- 112  BP (!) 140/96   Pulse 63   Resp 16   Wt 289 lb (131.1 kg)   SpO2 98%   BMI 37.11 kg/m  Weight yesterday-290 Last visit weight-282  Patient Care Team: Ivonne Andrew, NP as PCP - General (Pulmonary Disease) Quintella Reichert, MD as PCP - Cardiology (Cardiology) Laurey Morale, MD as PCP - Advanced Heart Failure (Cardiology) Lanier Prude, MD as PCP - Electrophysiology (Cardiology) Burna Sis, LCSW as Social Worker (Licensed Clinical Social Worker) Tressia Danas, MD (Inactive) as Consulting Physician (Gastroenterology)  Patient Active Problem List   Diagnosis Date Noted   Acute right-sided low back pain with right-sided sciatica 05/14/2023   Multiple joint pain 12/22/2022   Obesity (BMI 35.0-39.9 without comorbidity) 07/18/2022   Cellulitis 06/22/2022   Cellulitis, leg  06/20/2022   Presence of Watchman left atrial appendage closure device 10/28/2021   Atrial fibrillation (HCC) 10/27/2021   Melena    Chronic diastolic CHF (congestive heart failure) (HCC)    GI bleed 06/29/2021   Severe anemia 06/29/2021   Adenomatous polyp of ascending colon    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   Acute blood loss anemia    Angiodysplasia of stomach    Chronic anticoagulation    CHF exacerbation (HCC) 06/15/2020   AKI (acute kidney injury) (HCC) 06/15/2020   CKD (chronic kidney disease), stage III (HCC) 06/15/2020   Anemia due to chronic blood loss    Gastric AVM    Angiodysplasia of duodenum with hemorrhage    Chronic systolic CHF (congestive heart failure) (HCC) 04/05/2020   Anemia 04/05/2020   PAF (paroxysmal atrial fibrillation) (HCC) 04/05/2020   OSA (obstructive sleep apnea) 04/05/2020   Syncope and collapse 04/05/2020   Type 2 diabetes mellitus with hyperglycemia, with long-term current use of insulin (HCC) 12/03/2019   Diabetic polyneuropathy associated with type 2 diabetes mellitus (HCC) 12/03/2019   Erectile dysfunction 12/03/2019   Paranoid schizophrenia, chronic condition (HCC) 01/26/2015   Severe recurrent major depressive disorder with psychotic features (HCC) 01/26/2015   GAD (generalized anxiety disorder) 01/26/2015   OCD (obsessive compulsive disorder) 01/26/2015   Panic disorder without agoraphobia 01/26/2015   Insomnia 01/26/2015  Current Outpatient Medications:    acetaminophen (TYLENOL) 500 MG tablet, Take 2 tablets (1,000 mg total) by mouth 2 (two) times daily as needed. (Patient taking differently: Take 1,000 mg by mouth 2 (two) times daily as needed. Takes 1000mg  morning and night.), Disp: 180 tablet, Rfl: 3   aspirin EC 81 MG tablet, Take 1 tablet (81 mg total) by mouth daily. Swallow whole., Disp: 90 tablet, Rfl: 3   atorvastatin (LIPITOR) 40 MG tablet, Take 1 tablet (40 mg total) by mouth daily., Disp:  90 tablet, Rfl: 3   blood glucose meter kit and supplies KIT, Dispense based on patient and insurance preference. Use up to four times daily as directed. (FOR ICD-9 250.00, 250.01)., Disp: 1 each, Rfl: 0   colchicine 0.6 MG tablet, Daily for 7 days then daily PRN for gout flare, Disp: 30 tablet, Rfl: 0   Continuous Glucose Receiver (FREESTYLE LIBRE 3 READER) DEVI, 1 each by Does not apply route daily. Use to check glucose continuously, Disp: 1 each, Rfl: 1   Continuous Glucose Sensor (FREESTYLE LIBRE 3 SENSOR) MISC, Place 1 sensor on the skin every 14 days. Use to check glucose continuously, Disp: 6 each, Rfl: 2   diclofenac Sodium (VOLTAREN) 1 % GEL, Apply 2 g topically daily as needed for pain., Disp: , Rfl:    DULoxetine (CYMBALTA) 30 MG capsule, Take 1 capsule by mouth once daily, Disp: 30 capsule, Rfl: 2   empagliflozin (JARDIANCE) 10 MG TABS tablet, TAKE 1 TABLET BY MOUTH ONCE DAILY ., Disp: 90 tablet, Rfl: 3   escitalopram (LEXAPRO) 10 MG tablet, Take 10 mg by mouth daily. Prescribed by Dr. Elvera Lennox. Dimkpa, Disp: , Rfl:    furosemide (LASIX) 40 MG tablet, Take 1.5 tablets (60 mg total) by mouth 2 (two) times daily., Disp: 200 tablet, Rfl: 5   gabapentin (NEURONTIN) 800 MG tablet, Take 1 tablet (800 mg total) by mouth 3 (three) times daily., Disp: 90 tablet, Rfl: 5   HUMALOG MIX 75/25 KWIKPEN (75-25) 100 UNIT/ML KwikPen, DIAL AND INJECT 30 UNITS UNDER THE SKIN TWICE DAILY, Disp: 75 mL, Rfl: 0   metFORMIN (GLUCOPHAGE-XR) 500 MG 24 hr tablet, Take 1 tablet (500 mg total) by mouth 2 (two) times daily with a meal., Disp: 180 tablet, Rfl: 1   metoprolol succinate (TOPROL-XL) 50 MG 24 hr tablet, Take 1 tablet (50 mg total) by mouth in the morning and at bedtime. Take with or immediately following a meal., Disp: 180 tablet, Rfl: 2   Multiple Vitamins-Minerals (CENTRUM SILVER 50+MEN) TABS, Take 1 tablet by mouth daily., Disp: , Rfl:    mupirocin ointment (BACTROBAN) 2 %, Apply 1 Application topically 2 (two)  times daily., Disp: 22 g, Rfl: 0   Omega-3 Fatty Acids (FISH OIL) 1000 MG CAPS, Take 1,000 mg by mouth at bedtime., Disp: , Rfl:    Oxycodone HCl 10 MG TABS, Take 10 mg by mouth 3 (three) times daily., Disp: , Rfl:    pantoprazole (PROTONIX) 40 MG tablet, Take 1 tablet by mouth once daily, Disp: 90 tablet, Rfl: 0   potassium chloride SA (KLOR-CON M) 20 MEQ tablet, Take 2 tablets (40 mEq total) by mouth daily., Disp: 60 tablet, Rfl: 6   RELION PEN NEEDLES 31G X 6 MM MISC, INJECT 1 PEN INTO THE SKIN 2 TIMES DAILY, Disp: 100 each, Rfl: 0   sacubitril-valsartan (ENTRESTO) 24-26 MG, Take 1 tablet by mouth 2 (two) times daily. NEEDS FOLLOW UP APPOINTMENT FOR MORE REFILLS, Disp: 180 tablet, Rfl: 0  Semaglutide, 2 MG/DOSE, (OZEMPIC, 2 MG/DOSE,) 8 MG/3ML SOPN, Inject 2 mg into the skin once a week. Monday, Disp: , Rfl:    sildenafil (VIAGRA) 100 MG tablet, Take 0.5-1 tablets (50-100 mg total) by mouth daily as needed for erectile dysfunction., Disp: 5 tablet, Rfl: 11   spironolactone (ALDACTONE) 25 MG tablet, Take 1 tablet (25 mg total) by mouth daily., Disp: 90 tablet, Rfl: 3   tadalafil (CIALIS) 20 MG tablet, Take 20 mg by mouth daily as needed., Disp: , Rfl:    Vitamin D, Ergocalciferol, (DRISDOL) 1.25 MG (50000 UNIT) CAPS capsule, Take 1 capsule (50,000 Units total) by mouth once a week., Disp: 5 capsule, Rfl: 2   zinc gluconate 50 MG tablet, Take 50 mg by mouth daily., Disp: , Rfl:    albuterol (VENTOLIN HFA) 108 (90 Base) MCG/ACT inhaler, Inhale 2 puffs into the lungs every 6 (six) hours as needed for wheezing or shortness of breath., Disp: 18 g, Rfl: 1   ferrous sulfate 325 (65 FE) MG tablet, Take 1 tablet (325 mg total) by mouth every other day. (Patient not taking: Reported on 06/26/2023), Disp: 30 tablet, Rfl: 1   metolazone (ZAROXOLYN) 2.5 MG tablet, Take 1 tablet (2.5 mg total) by mouth as directed. (Patient not taking: Reported on 01/29/2023), Disp: 5 tablet, Rfl: 2   QUEtiapine (SEROQUEL) 300 MG  tablet, Take 1.5 tablets (450 mg total) by mouth at bedtime., Disp: 135 tablet, Rfl: 1 No Known Allergies    Social History   Socioeconomic History   Marital status: Legally Separated    Spouse name: Not on file   Number of children: Not on file   Years of education: Not on file   Highest education level: Not on file  Occupational History   Not on file  Tobacco Use   Smoking status: Never    Passive exposure: Never   Smokeless tobacco: Never  Vaping Use   Vaping status: Never Used  Substance and Sexual Activity   Alcohol use: Not Currently    Alcohol/week: 2.0 standard drinks of alcohol    Types: 2 Cans of beer per week   Drug use: No   Sexual activity: Yes    Birth control/protection: None  Other Topics Concern   Not on file  Social History Narrative   Not on file   Social Determinants of Health   Financial Resource Strain: Medium Risk (05/24/2023)   Overall Financial Resource Strain (CARDIA)    Difficulty of Paying Living Expenses: Somewhat hard  Food Insecurity: Food Insecurity Present (06/07/2023)   Hunger Vital Sign    Worried About Running Out of Food in the Last Year: Sometimes true    Ran Out of Food in the Last Year: Sometimes true  Transportation Needs: No Transportation Needs (05/24/2023)   PRAPARE - Administrator, Civil Service (Medical): No    Lack of Transportation (Non-Medical): No  Physical Activity: Sufficiently Active (05/24/2023)   Exercise Vital Sign    Days of Exercise per Week: 7 days    Minutes of Exercise per Session: 30 min  Stress: No Stress Concern Present (05/24/2023)   [employer] of Occupational Health - Occupational Stress Questionnaire    Feeling of Stress : Not at all  Social Connections: Moderately Isolated (05/24/2023)   Social Connection and Isolation Panel [NHANES]    Frequency of Communication with Friends and Family: More than three times a week    Frequency of Social Gatherings with Friends and Family: More  than three times a week    Attends Religious Services: Never    Active Member of Clubs or Organizations: No    Attends Banker Meetings: Never    Marital Status: Living with partner  Intimate Partner Violence: Not At Risk (05/24/2023)   Humiliation, Afraid, Rape, and Kick questionnaire    Fear of Current or Ex-Partner: No    Emotionally Abused: No    Physically Abused: No    Sexually Abused: No    Physical Exam      Future Appointments  Date Time Provider Department Center  07/23/2023 10:40 AM Fanny Dance, MD [facility] CPR  08/15/2023 10:20 AM Ivonne Andrew, NP [facility] None  09/03/2023  7:40 AM [facility] DEVICE REMOTES [facility]  12/04/2023  7:40 AM [facility] DEVICE REMOTES [facility]  03/05/2024  7:40 AM [facility] DEVICE REMOTES [facility]  05/29/2024  1:00 PM [facility]-ANNUAL WELLNESS VISIT [facility] None  06/05/2024  7:40 AM [facility] DEVICE REMOTES [facility]  09/05/2024  7:40 AM [facility] DEVICE REMOTES [facility]       Kerry Hough, Paramedic [phone] [facility] Paramedic  07/11/23

## 2023-07-17 ENCOUNTER — Other Ambulatory Visit: Payer: Self-pay | Admitting: Physician Assistant

## 2023-07-17 ENCOUNTER — Telehealth (HOSPITAL_COMMUNITY): Payer: Self-pay

## 2023-07-17 NOTE — Telephone Encounter (Signed)
Called patient to see if he needs sleep medicine appointment to address his cpap mask, no answer. Left message asking recipient to call our office.

## 2023-07-17 NOTE — Telephone Encounter (Signed)
Called in the following refills to [facility] on [address]-  -Metformin -Seroquel (needs refills) -Potassium (picked up on 8/12) -Metoprolol (needs refills)  -Duloxetine  -Vit D -Lasix -Lexapro   [name], EMT-Paramedic [phone] 07/17/2023

## 2023-07-18 NOTE — Telephone Encounter (Signed)
This is a CHF pt that has been seen at this clinic. Please address

## 2023-07-19 NOTE — Telephone Encounter (Signed)
This is a CHF pt that was last seen on 01/29/23 in the clinic. Pt is still a pt at CHF clinic. Please address

## 2023-07-23 ENCOUNTER — Encounter: Payer: Medicare Other | Admitting: Physical Medicine & Rehabilitation

## 2023-07-23 NOTE — Telephone Encounter (Signed)
Called patient to see if he needs sleep medicine appointment to address his cpap mask, no answer.  Unable  to leave message asking recipient to call our office, mailbox full.

## 2023-07-24 IMAGING — DX DG CHEST 1V PORT
1 series · 2 of 2 positions shown · non-contrast
Comparison: April 13, 2021

CLINICAL DATA: Substernal chest pain.

EXAM:
PORTABLE CHEST 1 VIEW

[Series 1: chest · 0.14mm/px · 2 of 2 slices shown]
[im 1/2]
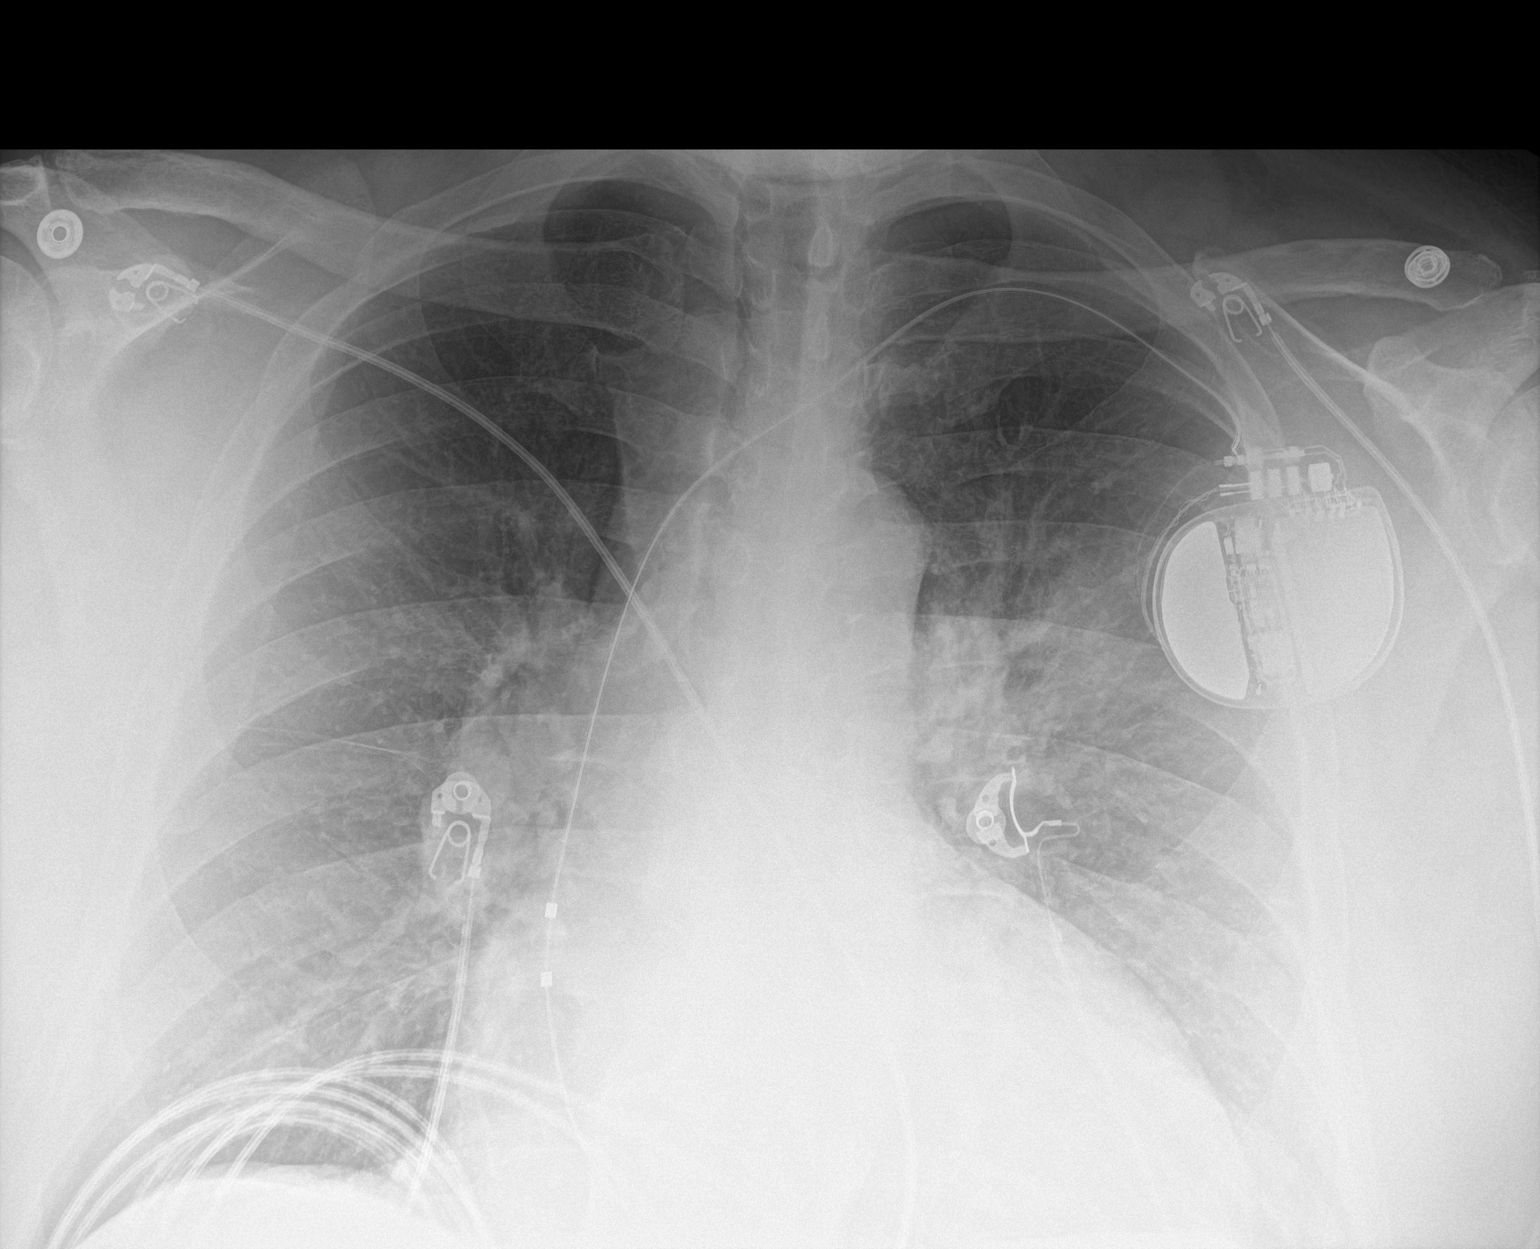
[im 2/2]
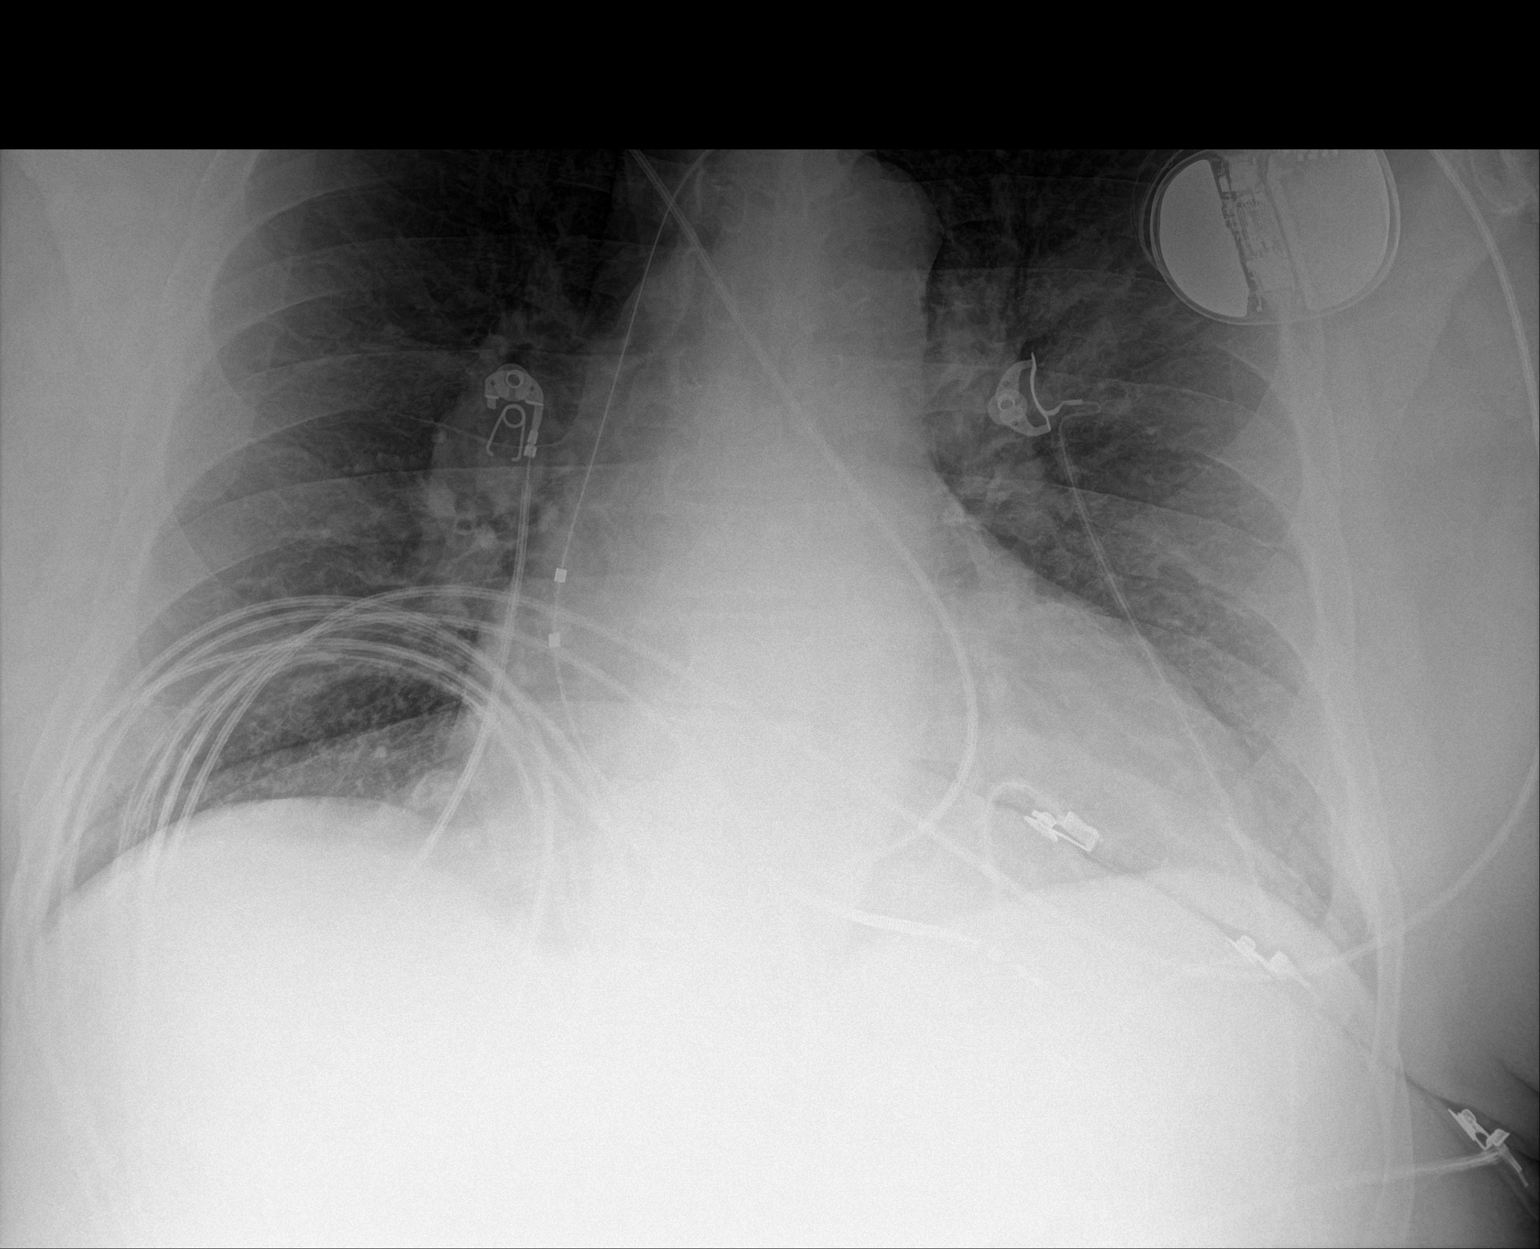

[2 of 2 positions shown; findings below may reference images not displayed]

FINDINGS: A stable single lead ventricular pacer is noted. Mild atelectasis
and/or infiltrate is seen within the suprahilar region on the left.
There is no evidence of a pleural effusion or pneumothorax. The
cardiac silhouette is mildly enlarged. The visualized skeletal
structures are unremarkable.
IMPRESSION: Stable cardiomegaly with mild left suprahilar atelectasis and/or
infiltrate.

## 2023-07-25 ENCOUNTER — Other Ambulatory Visit: Payer: Self-pay | Admitting: Physician Assistant

## 2023-07-25 ENCOUNTER — Other Ambulatory Visit (HOSPITAL_COMMUNITY): Payer: Self-pay | Admitting: Emergency Medicine
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
## 2023-07-26 NOTE — Telephone Encounter (Signed)
This is a CHF pt 

## 2023-07-27 ENCOUNTER — Telehealth (HOSPITAL_COMMUNITY): Payer: Self-pay | Admitting: Emergency Medicine

## 2023-07-27 NOTE — Telephone Encounter (Signed)
3 phone calls today to Mr. Klas to go by and complete med rec.  No answer, LVM.      Beatrix Shipper, EMT-Paramedic [phone] 07/27/2023

## 2023-07-31 ENCOUNTER — Other Ambulatory Visit (HOSPITAL_COMMUNITY): Payer: Self-pay | Admitting: Emergency Medicine

## 2023-07-31 NOTE — Progress Notes (Signed)
Met in [name]'s home for a med rec as pt did not have the refills for our home visit. [name] handed me 3x pill boxes of assorted medications. Pt had taken some from each day and unable to determine compliance. I disassembled his pill box and filled only 1. Pt's pill box was filled for 1x week due to compliance and confusion concerns. Will follow up in 1x week.   Talking scale delivered to pt today.    [name] EMT-P Community Paramedic  [phone]

## 2023-08-01 ENCOUNTER — Telehealth (HOSPITAL_COMMUNITY): Payer: Self-pay

## 2023-08-01 NOTE — Telephone Encounter (Signed)
Called in the following refills to [facility] on [address] .   -Seroquel (too soon- fillable on 08/08/23) -Lexapro (ready)   [name], EMT-Paramedic [phone] 08/01/2023

## 2023-08-07 ENCOUNTER — Other Ambulatory Visit (HOSPITAL_COMMUNITY): Payer: Self-pay | Admitting: Emergency Medicine

## 2023-08-07 ENCOUNTER — Other Ambulatory Visit: Payer: Self-pay | Admitting: Nurse Practitioner

## 2023-08-07 NOTE — Progress Notes (Signed)
Paramedicine Encounter    Patient ID: [name], male    DOB: 09/20/1961, 62 y.o.   MRN: [id number]   Complaints -  no complaints   Assessment - no edema noted, lung sounds clear  Compliance with meds - missed 2 eve, 1 morn, 1 noon dose  Pill box filled - for 1x week   Refills needed - tylenol   Meds changes since last visit - none     Social changes - none    VISIT SUMMARY**  Met with [name] in the home today. Pt denied any complaints and seemed to think he was doing well with his medication. Pt only missed 4 doses in the past week. Pt was noted to have a 4 lb weight gain from 2x weeks ago, pt seems to think it is attributed to sodas. Pt was given alternatives to soda and was reminded of parameters of fluid intake. Pt was assessed as noted and found no points of concern. Pt's pill box was filled for 1x week. Upcoming appointments were reviewed and a note left for reference. Will follow up in the Clinic with pt next week.   BP 108/76   Pulse 92   Wt 289 lb (131.1 kg)   SpO2 95%   BMI 37.11 kg/m  Weight yesterday-DNW Last visit weight-285 lbs  CBG 193  [name] EMT-P Community Paramedic  [phone]    ACTION: Home visit completed     Patient Care Team: Ivonne Andrew, NP as PCP - General (Pulmonary Disease) Quintella Reichert, MD as PCP - Cardiology (Cardiology) Laurey Morale, MD as PCP - Advanced Heart Failure (Cardiology) Lanier Prude, MD as PCP - Electrophysiology (Cardiology) Burna Sis, LCSW as Social Worker (Licensed Clinical Social Worker) Tressia Danas, MD (Inactive) as Consulting Physician (Gastroenterology)  Patient Active Problem List   Diagnosis Date Noted   Acute right-sided low back pain with right-sided sciatica 05/14/2023   Multiple joint pain 12/22/2022   Obesity (BMI 35.0-39.9 without comorbidity) 07/18/2022   Cellulitis 06/22/2022   Cellulitis, leg 06/20/2022   Presence of Watchman left atrial appendage  closure device 10/28/2021   Atrial fibrillation (HCC) 10/27/2021   Melena    Chronic diastolic CHF (congestive heart failure) (HCC)    GI bleed 06/29/2021   Severe anemia 06/29/2021   Adenomatous polyp of ascending colon    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   Acute blood loss anemia    Angiodysplasia of stomach    Chronic anticoagulation    CHF exacerbation (HCC) 06/15/2020   AKI (acute kidney injury) (HCC) 06/15/2020   CKD (chronic kidney disease), stage III (HCC) 06/15/2020   Anemia due to chronic blood loss    Gastric AVM    Angiodysplasia of duodenum with hemorrhage    Chronic systolic CHF (congestive heart failure) (HCC) 04/05/2020   Anemia 04/05/2020   PAF (paroxysmal atrial fibrillation) (HCC) 04/05/2020   OSA (obstructive sleep apnea) 04/05/2020   Syncope and collapse 04/05/2020   Type 2 diabetes mellitus with hyperglycemia, with long-term current use of insulin (HCC) 12/03/2019   Diabetic polyneuropathy associated with type 2 diabetes mellitus (HCC) 12/03/2019   Erectile dysfunction 12/03/2019   Paranoid schizophrenia, chronic condition (HCC) 01/26/2015   Severe recurrent major depressive disorder with psychotic features (HCC) 01/26/2015   GAD (generalized anxiety disorder) 01/26/2015   OCD (obsessive compulsive disorder) 01/26/2015   Panic disorder without agoraphobia 01/26/2015   Insomnia 01/26/2015    Current Outpatient Medications:    acetaminophen (  TYLENOL) 500 MG tablet, Take 2 tablets (1,000 mg total) by mouth 2 (two) times daily as needed. (Patient taking differently: Take 1,000 mg by mouth 2 (two) times daily as needed. Takes 1000mg  morning and night.), Disp: 180 tablet, Rfl: 3   aspirin EC 81 MG tablet, Take 1 tablet (81 mg total) by mouth daily. Swallow whole., Disp: 90 tablet, Rfl: 3   atorvastatin (LIPITOR) 40 MG tablet, Take 1 tablet (40 mg total) by mouth daily., Disp: 90 tablet, Rfl: 3   blood glucose meter kit and supplies  KIT, Dispense based on patient and insurance preference. Use up to four times daily as directed. (FOR ICD-9 250.00, 250.01)., Disp: 1 each, Rfl: 0   Continuous Glucose Receiver (FREESTYLE LIBRE 3 READER) DEVI, 1 each by Does not apply route daily. Use to check glucose continuously, Disp: 1 each, Rfl: 1   Continuous Glucose Sensor (FREESTYLE LIBRE 3 SENSOR) MISC, Place 1 sensor on the skin every 14 days. Use to check glucose continuously, Disp: 6 each, Rfl: 2   diclofenac Sodium (VOLTAREN) 1 % GEL, Apply 2 g topically daily as needed for pain., Disp: , Rfl:    DULoxetine (CYMBALTA) 30 MG capsule, Take 1 capsule by mouth once daily, Disp: 30 capsule, Rfl: 2   empagliflozin (JARDIANCE) 10 MG TABS tablet, TAKE 1 TABLET BY MOUTH ONCE DAILY ., Disp: 90 tablet, Rfl: 3   escitalopram (LEXAPRO) 10 MG tablet, Take 10 mg by mouth daily. Prescribed by Dr. Elvera Lennox. Dimkpa, Disp: , Rfl:    furosemide (LASIX) 40 MG tablet, Take 1.5 tablets (60 mg total) by mouth 2 (two) times daily., Disp: 200 tablet, Rfl: 5   gabapentin (NEURONTIN) 800 MG tablet, Take 1 tablet (800 mg total) by mouth 3 (three) times daily., Disp: 90 tablet, Rfl: 5   HUMALOG MIX 75/25 KWIKPEN (75-25) 100 UNIT/ML KwikPen, DIAL AND INJECT 30 UNITS UNDER THE SKIN TWICE DAILY, Disp: 75 mL, Rfl: 0   metFORMIN (GLUCOPHAGE-XR) 500 MG 24 hr tablet, Take 1 tablet (500 mg total) by mouth 2 (two) times daily with a meal., Disp: 180 tablet, Rfl: 1   metoprolol succinate (TOPROL-XL) 50 MG 24 hr tablet, Take 1 tablet (50 mg total) by mouth 2 (two) times daily. NEEDS FOLLOW UP APPOINTMENT FOR MORE REFILLS, Disp: 180 tablet, Rfl: 0   Multiple Vitamins-Minerals (CENTRUM SILVER 50+MEN) TABS, Take 1 tablet by mouth daily., Disp: , Rfl:    Omega-3 Fatty Acids (FISH OIL) 1000 MG CAPS, Take 1,000 mg by mouth at bedtime., Disp: , Rfl:    pantoprazole (PROTONIX) 40 MG tablet, Take 1 tablet by mouth once daily, Disp: 90 tablet, Rfl: 0   potassium chloride SA (KLOR-CON M) 20 MEQ  tablet, Take 2 tablets (40 mEq total) by mouth daily., Disp: 60 tablet, Rfl: 6   RELION PEN NEEDLES 31G X 6 MM MISC, INJECT 1 PEN INTO THE SKIN 2 TIMES DAILY, Disp: 100 each, Rfl: 0   sacubitril-valsartan (ENTRESTO) 24-26 MG, Take 1 tablet by mouth 2 (two) times daily. NEEDS FOLLOW UP APPOINTMENT FOR MORE REFILLS, Disp: 180 tablet, Rfl: 0   Semaglutide, 2 MG/DOSE, (OZEMPIC, 2 MG/DOSE,) 8 MG/3ML SOPN, Inject 2 mg into the skin once a week. Monday, Disp: , Rfl:    sildenafil (VIAGRA) 100 MG tablet, Take 0.5-1 tablets (50-100 mg total) by mouth daily as needed for erectile dysfunction., Disp: 5 tablet, Rfl: 11   spironolactone (ALDACTONE) 25 MG tablet, Take 1 tablet (25 mg total) by mouth daily., Disp: 90 tablet,  Rfl: 3   tadalafil (CIALIS) 20 MG tablet, Take 20 mg by mouth daily as needed., Disp: , Rfl:    Vitamin D, Ergocalciferol, (DRISDOL) 1.25 MG (50000 UNIT) CAPS capsule, Take 1 capsule (50,000 Units total) by mouth once a week., Disp: 5 capsule, Rfl: 2   albuterol (VENTOLIN HFA) 108 (90 Base) MCG/ACT inhaler, Inhale 2 puffs into the lungs every 6 (six) hours as needed for wheezing or shortness of breath., Disp: 18 g, Rfl: 1   colchicine 0.6 MG tablet, Daily for 7 days then daily PRN for gout flare (Patient not taking: Reported on 07/31/2023), Disp: 30 tablet, Rfl: 0   ferrous sulfate 325 (65 FE) MG tablet, Take 1 tablet (325 mg total) by mouth every other day. (Patient not taking: Reported on 06/26/2023), Disp: 30 tablet, Rfl: 1   metolazone (ZAROXOLYN) 2.5 MG tablet, Take 1 tablet (2.5 mg total) by mouth as directed. (Patient not taking: Reported on 01/29/2023), Disp: 5 tablet, Rfl: 2   mupirocin ointment (BACTROBAN) 2 %, Apply 1 Application topically 2 (two) times daily. (Patient not taking: Reported on 07/25/2023), Disp: 22 g, Rfl: 0   Oxycodone HCl 10 MG TABS, Take 10 mg by mouth 3 (three) times daily. (Patient not taking: Reported on 07/25/2023), Disp: , Rfl:    QUEtiapine (SEROQUEL) 300 MG tablet,  Take 1.5 tablets (450 mg total) by mouth at bedtime., Disp: 135 tablet, Rfl: 1   zinc gluconate 50 MG tablet, Take 50 mg by mouth daily. (Patient not taking: Reported on 07/25/2023), Disp: , Rfl:  No Known Allergies   Social History   Socioeconomic History   Marital status: Legally Separated    Spouse name: Not on file   Number of children: Not on file   Years of education: Not on file   Highest education level: Not on file  Occupational History   Not on file  Tobacco Use   Smoking status: Never    Passive exposure: Never   Smokeless tobacco: Never  Vaping Use   Vaping status: Never Used  Substance and Sexual Activity   Alcohol use: Not Currently    Alcohol/week: 2.0 standard drinks of alcohol    Types: 2 Cans of beer per week   Drug use: No   Sexual activity: Yes    Birth control/protection: None  Other Topics Concern   Not on file  Social History Narrative   Not on file   Social Determinants of Health   Financial Resource Strain: Medium Risk (05/24/2023)   Overall Financial Resource Strain (CARDIA)    Difficulty of Paying Living Expenses: Somewhat hard  Food Insecurity: Food Insecurity Present (06/07/2023)   Hunger Vital Sign    Worried About Running Out of Food in the Last Year: Sometimes true    Ran Out of Food in the Last Year: Sometimes true  Transportation Needs: No Transportation Needs (05/24/2023)   PRAPARE - Administrator, Civil Service (Medical): No    Lack of Transportation (Non-Medical): No  Physical Activity: Sufficiently Active (05/24/2023)   Exercise Vital Sign    Days of Exercise per Week: 7 days    Minutes of Exercise per Session: 30 min  Stress: No Stress Concern Present (05/24/2023)   [employer] of Occupational Health - Occupational Stress Questionnaire    Feeling of Stress : Not at all  Social Connections: Moderately Isolated (05/24/2023)   Social Connection and Isolation Panel [NHANES]    Frequency of Communication with Friends  and Family: More than  three times a week    Frequency of Social Gatherings with Friends and Family: More than three times a week    Attends Religious Services: Never    Database administrator or Organizations: No    Attends Banker Meetings: Never    Marital Status: Living with partner  Intimate Partner Violence: Not At Risk (05/24/2023)   Humiliation, Afraid, Rape, and Kick questionnaire    Fear of Current or Ex-Partner: No    Emotionally Abused: No    Physically Abused: No    Sexually Abused: No    Physical Exam      Future Appointments  Date Time Provider Department Center  08/15/2023 10:20 AM Ivonne Andrew, NP [facility] None  08/15/2023  2:30 PM [facility] PA/NP [facility] None  09/03/2023  7:40 AM [facility] DEVICE REMOTES [facility]  12/04/2023  7:40 AM [facility] DEVICE REMOTES [facility]  03/05/2024  7:40 AM [facility] DEVICE REMOTES [facility]  05/29/2024  1:00 PM [facility]ANNUAL WELLNESS VISIT [facility] None  06/05/2024  7:40 AM [facility] DEVICE REMOTES [facility]  09/05/2024  7:40 AM [facility] DEVICE REMOTES [facility]

## 2023-08-14 ENCOUNTER — Telehealth (HOSPITAL_COMMUNITY): Payer: Self-pay

## 2023-08-14 ENCOUNTER — Telehealth (HOSPITAL_COMMUNITY): Payer: Self-pay | Admitting: Emergency Medicine

## 2023-08-14 NOTE — Telephone Encounter (Signed)
Contacting Mr. Denger today to remind him of his two upcoming appointments tomorrow.   Will try again   [name] EMT-P Community Paramedic  [phone]

## 2023-08-14 NOTE — Telephone Encounter (Signed)
Called and left patient a voice message to confirm/remind patient of their appointment at [facility] on 08/15/23.   And to bring in all medications and/or complete list.

## 2023-08-15 ENCOUNTER — Other Ambulatory Visit (HOSPITAL_COMMUNITY): Payer: Self-pay | Admitting: Emergency Medicine

## 2023-08-15 ENCOUNTER — Telehealth (HOSPITAL_COMMUNITY): Payer: Self-pay | Admitting: Emergency Medicine

## 2023-08-15 ENCOUNTER — Ambulatory Visit: Payer: Self-pay | Admitting: Nurse Practitioner

## 2023-08-15 ENCOUNTER — Encounter (HOSPITAL_COMMUNITY): Payer: Medicare Other

## 2023-08-15 NOTE — Telephone Encounter (Signed)
Attempted to contact [name] for his upcoming appointments today. Pt did not answer the phone and no ability to leave a message due to the mailbox being too full. A text message was sent regarding his upcoming appointment information. Will follow up in the clinic with pt at 2:30.   [name] EMT-P Community Paramedic  [phone]

## 2023-08-15 NOTE — Progress Notes (Signed)
Paramedicine Encounter    Patient ID: [name], male    DOB: 04-09-1961, 62 y.o.   MRN: [id number]   Complaints - pain to teeth on left side.  Assessment - lung sounds clear, no edema noted.    Compliance with meds - Missed 1x noon dose   Pill box filled -  for 1x week   Refills needed - Iron  Meds changes since last visit - none    Social changes -  none   VISIT SUMMARY**  Met with [name] today in the home. Pt called and requested a reschedule of his [facility] appointment due to illness. I came over for his home visit today. Pt denied any HF specific symptoms just stuffy/couch/tooth pain. Pt was assessed as noted to find no acute changes. Pt's pill box was filled for 1x week. Upcoming appointments reviewed and written down for easy reference. Will follow up in 1x week.   BP (!) 150/90   Pulse 76   Resp 16   Wt 289 lb 3.2 oz (131.2 kg)   SpO2 94%   BMI 37.13 kg/m  Weight yesterday-DNW Last visit weight-289 lbs   [name] EMT-P Community Paramedic  [phone]     ACTION: Home visit completed     Patient Care Team: Ivonne Andrew, NP as PCP - General (Pulmonary Disease) Quintella Reichert, MD as PCP - Cardiology (Cardiology) Laurey Morale, MD as PCP - Advanced Heart Failure (Cardiology) Lanier Prude, MD as PCP - Electrophysiology (Cardiology) Burna Sis, LCSW as Social Worker (Licensed Clinical Social Worker) Tressia Danas, MD (Inactive) as Consulting Physician (Gastroenterology)  Patient Active Problem List   Diagnosis Date Noted   Acute right-sided low back pain with right-sided sciatica 05/14/2023   Multiple joint pain 12/22/2022   Obesity (BMI 35.0-39.9 without comorbidity) 07/18/2022   Cellulitis 06/22/2022   Cellulitis, leg 06/20/2022   Presence of Watchman left atrial appendage closure device 10/28/2021   Atrial fibrillation (HCC) 10/27/2021   Melena    Chronic diastolic CHF (congestive heart failure) (HCC)    GI bleed  06/29/2021   Severe anemia 06/29/2021   Adenomatous polyp of ascending colon    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   Acute blood loss anemia    Angiodysplasia of stomach    Chronic anticoagulation    CHF exacerbation (HCC) 06/15/2020   AKI (acute kidney injury) (HCC) 06/15/2020   CKD (chronic kidney disease), stage III (HCC) 06/15/2020   Anemia due to chronic blood loss    Gastric AVM    Angiodysplasia of duodenum with hemorrhage    Chronic systolic CHF (congestive heart failure) (HCC) 04/05/2020   Anemia 04/05/2020   PAF (paroxysmal atrial fibrillation) (HCC) 04/05/2020   OSA (obstructive sleep apnea) 04/05/2020   Syncope and collapse 04/05/2020   Type 2 diabetes mellitus with hyperglycemia, with long-term current use of insulin (HCC) 12/03/2019   Diabetic polyneuropathy associated with type 2 diabetes mellitus (HCC) 12/03/2019   Erectile dysfunction 12/03/2019   Paranoid schizophrenia, chronic condition (HCC) 01/26/2015   Severe recurrent major depressive disorder with psychotic features (HCC) 01/26/2015   GAD (generalized anxiety disorder) 01/26/2015   OCD (obsessive compulsive disorder) 01/26/2015   Panic disorder without agoraphobia 01/26/2015   Insomnia 01/26/2015    Current Outpatient Medications:    acetaminophen (TYLENOL) 500 MG tablet, Take 2 tablets (1,000 mg total) by mouth 2 (two) times daily as needed. (Patient taking differently: Take 1,000 mg by mouth 2 (two) times daily as needed.  Takes 1000mg  morning and night.), Disp: 180 tablet, Rfl: 3   aspirin EC 81 MG tablet, Take 1 tablet (81 mg total) by mouth daily. Swallow whole., Disp: 90 tablet, Rfl: 3   atorvastatin (LIPITOR) 40 MG tablet, Take 1 tablet (40 mg total) by mouth daily., Disp: 90 tablet, Rfl: 3   blood glucose meter kit and supplies KIT, Dispense based on patient and insurance preference. Use up to four times daily as directed. (FOR ICD-9 250.00, 250.01)., Disp: 1 each, Rfl: 0    Continuous Glucose Receiver (FREESTYLE LIBRE 3 READER) DEVI, 1 each by Does not apply route daily. Use to check glucose continuously, Disp: 1 each, Rfl: 1   Continuous Glucose Sensor (FREESTYLE LIBRE 3 SENSOR) MISC, Place 1 sensor on the skin every 14 days. Use to check glucose continuously, Disp: 6 each, Rfl: 2   diclofenac Sodium (VOLTAREN) 1 % GEL, Apply 2 g topically daily as needed for pain., Disp: , Rfl:    DULoxetine (CYMBALTA) 30 MG capsule, Take 1 capsule by mouth once daily, Disp: 30 capsule, Rfl: 2   empagliflozin (JARDIANCE) 10 MG TABS tablet, TAKE 1 TABLET BY MOUTH ONCE DAILY ., Disp: 90 tablet, Rfl: 3   escitalopram (LEXAPRO) 10 MG tablet, Take 10 mg by mouth daily. Prescribed by Dr. Elvera Lennox. Dimkpa, Disp: , Rfl:    furosemide (LASIX) 40 MG tablet, Take 1.5 tablets (60 mg total) by mouth 2 (two) times daily., Disp: 200 tablet, Rfl: 5   gabapentin (NEURONTIN) 800 MG tablet, Take 1 tablet (800 mg total) by mouth 3 (three) times daily., Disp: 90 tablet, Rfl: 5   HUMALOG MIX 75/25 KWIKPEN (75-25) 100 UNIT/ML KwikPen, DIAL AND INJECT 30 UNITS UNDER THE SKIN TWICE DAILY, Disp: 75 mL, Rfl: 0   metFORMIN (GLUCOPHAGE-XR) 500 MG 24 hr tablet, Take 1 tablet (500 mg total) by mouth 2 (two) times daily with a meal., Disp: 180 tablet, Rfl: 1   metoprolol succinate (TOPROL-XL) 50 MG 24 hr tablet, Take 1 tablet (50 mg total) by mouth 2 (two) times daily. NEEDS FOLLOW UP APPOINTMENT FOR MORE REFILLS, Disp: 180 tablet, Rfl: 0   Multiple Vitamins-Minerals (CENTRUM SILVER 50+MEN) TABS, Take 1 tablet by mouth daily., Disp: , Rfl:    Omega-3 Fatty Acids (FISH OIL) 1000 MG CAPS, Take 1,000 mg by mouth at bedtime., Disp: , Rfl:    pantoprazole (PROTONIX) 40 MG tablet, Take 1 tablet by mouth once daily, Disp: 90 tablet, Rfl: 0   potassium chloride SA (KLOR-CON M) 20 MEQ tablet, Take 2 tablets (40 mEq total) by mouth daily., Disp: 60 tablet, Rfl: 6   RELION PEN NEEDLES 31G X 6 MM MISC, INJECT 1 PEN INTO THE SKIN 2  TIMES DAILY, Disp: 100 each, Rfl: 0   sacubitril-valsartan (ENTRESTO) 24-26 MG, Take 1 tablet by mouth 2 (two) times daily. NEEDS FOLLOW UP APPOINTMENT FOR MORE REFILLS, Disp: 180 tablet, Rfl: 0   Semaglutide, 2 MG/DOSE, (OZEMPIC, 2 MG/DOSE,) 8 MG/3ML SOPN, Inject 2 mg into the skin once a week. Monday, Disp: , Rfl:    sildenafil (VIAGRA) 100 MG tablet, TAKE 1/2 TO 1 (ONE-HALF TO ONE) TABLET BY MOUTH ONCE DAILY AS NEEDED FOR ERECTILE DYSFUNCTION, Disp: 25 tablet, Rfl: 0   spironolactone (ALDACTONE) 25 MG tablet, Take 1 tablet (25 mg total) by mouth daily., Disp: 90 tablet, Rfl: 3   tadalafil (CIALIS) 20 MG tablet, Take 20 mg by mouth daily as needed., Disp: , Rfl:    Vitamin D, Ergocalciferol, (DRISDOL) 1.25 MG (  50000 UNIT) CAPS capsule, Take 1 capsule (50,000 Units total) by mouth once a week., Disp: 5 capsule, Rfl: 2   albuterol (VENTOLIN HFA) 108 (90 Base) MCG/ACT inhaler, Inhale 2 puffs into the lungs every 6 (six) hours as needed for wheezing or shortness of breath., Disp: 18 g, Rfl: 1   colchicine 0.6 MG tablet, Daily for 7 days then daily PRN for gout flare (Patient not taking: Reported on 07/31/2023), Disp: 30 tablet, Rfl: 0   ferrous sulfate 325 (65 FE) MG tablet, Take 1 tablet (325 mg total) by mouth every other day. (Patient not taking: Reported on 06/26/2023), Disp: 30 tablet, Rfl: 1   metolazone (ZAROXOLYN) 2.5 MG tablet, Take 1 tablet (2.5 mg total) by mouth as directed. (Patient not taking: Reported on 01/29/2023), Disp: 5 tablet, Rfl: 2   mupirocin ointment (BACTROBAN) 2 %, Apply 1 Application topically 2 (two) times daily. (Patient not taking: Reported on 07/25/2023), Disp: 22 g, Rfl: 0   Oxycodone HCl 10 MG TABS, Take 10 mg by mouth 3 (three) times daily. (Patient not taking: Reported on 07/25/2023), Disp: , Rfl:    QUEtiapine (SEROQUEL) 300 MG tablet, Take 1.5 tablets (450 mg total) by mouth at bedtime., Disp: 135 tablet, Rfl: 1   zinc gluconate 50 MG tablet, Take 50 mg by mouth daily.  (Patient not taking: Reported on 07/25/2023), Disp: , Rfl:  No Known Allergies   Social History   Socioeconomic History   Marital status: Legally Separated    Spouse name: Not on file   Number of children: Not on file   Years of education: Not on file   Highest education level: Not on file  Occupational History   Not on file  Tobacco Use   Smoking status: Never    Passive exposure: Never   Smokeless tobacco: Never  Vaping Use   Vaping status: Never Used  Substance and Sexual Activity   Alcohol use: Not Currently    Alcohol/week: 2.0 standard drinks of alcohol    Types: 2 Cans of beer per week   Drug use: No   Sexual activity: Yes    Birth control/protection: None  Other Topics Concern   Not on file  Social History Narrative   Not on file   Social Determinants of Health   Financial Resource Strain: Medium Risk (05/24/2023)   Overall Financial Resource Strain (CARDIA)    Difficulty of Paying Living Expenses: Somewhat hard  Food Insecurity: Food Insecurity Present (06/07/2023)   Hunger Vital Sign    Worried About Running Out of Food in the Last Year: Sometimes true    Ran Out of Food in the Last Year: Sometimes true  Transportation Needs: No Transportation Needs (05/24/2023)   PRAPARE - Administrator, Civil Service (Medical): No    Lack of Transportation (Non-Medical): No  Physical Activity: Sufficiently Active (05/24/2023)   Exercise Vital Sign    Days of Exercise per Week: 7 days    Minutes of Exercise per Session: 30 min  Stress: No Stress Concern Present (05/24/2023)   [employer] of Occupational Health - Occupational Stress Questionnaire    Feeling of Stress : Not at all  Social Connections: Moderately Isolated (05/24/2023)   Social Connection and Isolation Panel [NHANES]    Frequency of Communication with Friends and Family: More than three times a week    Frequency of Social Gatherings with Friends and Family: More than three times a week     Attends Religious Services: Never  Active Member of Clubs or Organizations: No    Attends Banker Meetings: Never    Marital Status: Living with partner  Intimate Partner Violence: Not At Risk (05/24/2023)   Humiliation, Afraid, Rape, and Kick questionnaire    Fear of Current or Ex-Partner: No    Emotionally Abused: No    Physically Abused: No    Sexually Abused: No    Physical Exam      Future Appointments  Date Time Provider Department Center  08/23/2023  1:40 PM Ivonne Andrew, NP [facility] None  08/28/2023  3:30 PM [facility] PA/NP [facility] None  09/03/2023  7:40 AM [facility] DEVICE REMOTES [facility]  12/04/2023  7:40 AM [facility] DEVICE REMOTES [facility]  03/05/2024  7:40 AM [facility] DEVICE REMOTES [facility]  05/29/2024  1:00 PM [facility]-ANNUAL WELLNESS VISIT [facility] None  06/05/2024  7:40 AM [facility] DEVICE REMOTES [facility]  09/05/2024  7:40 AM [facility] DEVICE REMOTES [facility]

## 2023-08-15 NOTE — Telephone Encounter (Signed)
Received a phone call from [name] advising that he needed to reschedule his [facility] Appointment today as he woke up sick.   Appointment rescheduled for Tuesday, October 1st at 3:30.  Pt notified of same.   Will follow up in the home today for a home visit.   [name] EMT-P Community Paramedic  [phone]

## 2023-08-17 ENCOUNTER — Other Ambulatory Visit: Payer: Self-pay | Admitting: Cardiology

## 2023-08-20 ENCOUNTER — Other Ambulatory Visit: Payer: Self-pay | Admitting: Cardiology

## 2023-08-22 ENCOUNTER — Other Ambulatory Visit (HOSPITAL_COMMUNITY): Payer: Self-pay | Admitting: Emergency Medicine

## 2023-08-22 NOTE — Progress Notes (Unsigned)
Paramedicine Encounter    Patient ID: [name], male    DOB: Jul 03, 1961, 62 y.o.   MRN: [id number]   Complaints - pulled muscle pain in chest, alleviated with tylenol x3 doses    Assessment - lung sounds clear, no edema presnet. Noted 4 lb weight gain since last week, pt advised he had been eating well and was reminded of his dietary restrictions.   Compliance with meds - missed 2x evening doses  Pill box filled for 1x week   Refills needed - none  Meds changes since last visit - none    Social changes - none   VISIT SUMMARY**  Met with [name] in the home today, pt reported feeling much better as he had a cold last visit. Pt was noted to have improved his compliance only missing two doses. Pt was assessed to find no acute concerns. Pt denied any recent shortness of breath, dizziness, or palpitations. Pt advised that he did have chest pains during the weekend. Pt decribed them as tender to palpation and painful upon ROM. Pt advised that this pain did aleviate with x3 doses od tylenol. Pt was not further evaluated and did not inform anyone about this pain. Pt's pill box was filled for 1x week. Upcoming appointments reviewed. Will follow up in 1x week.   BP 120/68   Pulse 68   Resp 16   Wt 293 lb (132.9 kg)   SpO2 96%   BMI 37.62 kg/m  Weight yesterday-DNW Last visit weight-289 lbs   [name] EMT-P Community Paramedic  [phone]     ACTION: Home visit completed     Patient Care Team: Ivonne Andrew, NP as PCP - General (Pulmonary Disease) Quintella Reichert, MD as PCP - Cardiology (Cardiology) Laurey Morale, MD as PCP - Advanced Heart Failure (Cardiology) Lanier Prude, MD as PCP - Electrophysiology (Cardiology) Burna Sis, LCSW as Social Worker (Licensed Clinical Social Worker) Tressia Danas, MD (Inactive) as Consulting Physician (Gastroenterology)  Patient Active Problem List   Diagnosis Date Noted   Acute right-sided low  back pain with right-sided sciatica 05/14/2023   Multiple joint pain 12/22/2022   Obesity (BMI 35.0-39.9 without comorbidity) 07/18/2022   Cellulitis 06/22/2022   Cellulitis, leg 06/20/2022   Presence of Watchman left atrial appendage closure device 10/28/2021   Atrial fibrillation (HCC) 10/27/2021   Melena    Chronic diastolic CHF (congestive heart failure) (HCC)    GI bleed 06/29/2021   Severe anemia 06/29/2021   Adenomatous polyp of ascending colon    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   Acute blood loss anemia    Angiodysplasia of stomach    Chronic anticoagulation    CHF exacerbation (HCC) 06/15/2020   AKI (acute kidney injury) (HCC) 06/15/2020   CKD (chronic kidney disease), stage III (HCC) 06/15/2020   Anemia due to chronic blood loss    Gastric AVM    Angiodysplasia of duodenum with hemorrhage    Chronic systolic CHF (congestive heart failure) (HCC) 04/05/2020   Anemia 04/05/2020   PAF (paroxysmal atrial fibrillation) (HCC) 04/05/2020   OSA (obstructive sleep apnea) 04/05/2020   Syncope and collapse 04/05/2020   Type 2 diabetes mellitus with hyperglycemia, with long-term current use of insulin (HCC) 12/03/2019   Diabetic polyneuropathy associated with type 2 diabetes mellitus (HCC) 12/03/2019   Erectile dysfunction 12/03/2019   Paranoid schizophrenia, chronic condition (HCC) 01/26/2015   Severe recurrent major depressive disorder with psychotic features (HCC) 01/26/2015   GAD (  generalized anxiety disorder) 01/26/2015   OCD (obsessive compulsive disorder) 01/26/2015   Panic disorder without agoraphobia 01/26/2015   Insomnia 01/26/2015    Current Outpatient Medications:    acetaminophen (TYLENOL) 500 MG tablet, Take 2 tablets (1,000 mg total) by mouth 2 (two) times daily as needed. (Patient taking differently: Take 1,000 mg by mouth 2 (two) times daily as needed. Takes 1000mg  morning and night.), Disp: 180 tablet, Rfl: 3   aspirin EC 81 MG  tablet, Take 1 tablet (81 mg total) by mouth daily. Swallow whole., Disp: 90 tablet, Rfl: 3   atorvastatin (LIPITOR) 40 MG tablet, Take 1 tablet (40 mg total) by mouth daily., Disp: 90 tablet, Rfl: 3   blood glucose meter kit and supplies KIT, Dispense based on patient and insurance preference. Use up to four times daily as directed. (FOR ICD-9 250.00, 250.01)., Disp: 1 each, Rfl: 0   Continuous Glucose Receiver (FREESTYLE LIBRE 3 READER) DEVI, 1 each by Does not apply route daily. Use to check glucose continuously, Disp: 1 each, Rfl: 1   Continuous Glucose Sensor (FREESTYLE LIBRE 3 SENSOR) MISC, Place 1 sensor on the skin every 14 days. Use to check glucose continuously, Disp: 6 each, Rfl: 2   diclofenac Sodium (VOLTAREN) 1 % GEL, Apply 2 g topically daily as needed for pain., Disp: , Rfl:    DULoxetine (CYMBALTA) 30 MG capsule, Take 1 capsule by mouth once daily, Disp: 30 capsule, Rfl: 2   empagliflozin (JARDIANCE) 10 MG TABS tablet, TAKE 1 TABLET BY MOUTH ONCE DAILY ., Disp: 90 tablet, Rfl: 3   escitalopram (LEXAPRO) 10 MG tablet, Take 10 mg by mouth daily. Prescribed by Dr. Elvera Lennox. Dimkpa, Disp: , Rfl:    furosemide (LASIX) 40 MG tablet, Take 1.5 tablets (60 mg total) by mouth 2 (two) times daily., Disp: 200 tablet, Rfl: 5   gabapentin (NEURONTIN) 800 MG tablet, Take 1 tablet (800 mg total) by mouth 3 (three) times daily., Disp: 90 tablet, Rfl: 5   HUMALOG MIX 75/25 KWIKPEN (75-25) 100 UNIT/ML KwikPen, DIAL AND INJECT 30 UNITS UNDER THE SKIN TWICE DAILY, Disp: 75 mL, Rfl: 0   metFORMIN (GLUCOPHAGE-XR) 500 MG 24 hr tablet, Take 1 tablet (500 mg total) by mouth 2 (two) times daily with a meal., Disp: 180 tablet, Rfl: 1   metoprolol succinate (TOPROL-XL) 50 MG 24 hr tablet, Take 1 tablet (50 mg total) by mouth daily., Disp: 180 tablet, Rfl: 3   Multiple Vitamins-Minerals (CENTRUM SILVER 50+MEN) TABS, Take 1 tablet by mouth daily., Disp: , Rfl:    Omega-3 Fatty Acids (FISH OIL) 1000 MG CAPS, Take 1,000 mg  by mouth at bedtime., Disp: , Rfl:    pantoprazole (PROTONIX) 40 MG tablet, Take 1 tablet by mouth once daily, Disp: 90 tablet, Rfl: 0   potassium chloride SA (KLOR-CON M) 20 MEQ tablet, Take 2 tablets (40 mEq total) by mouth daily., Disp: 60 tablet, Rfl: 6   RELION PEN NEEDLES 31G X 6 MM MISC, INJECT 1 PEN INTO THE SKIN 2 TIMES DAILY, Disp: 100 each, Rfl: 0   sacubitril-valsartan (ENTRESTO) 24-26 MG, Take 1 tablet by mouth 2 (two) times daily. NEEDS FOLLOW UP APPOINTMENT FOR MORE REFILLS, Disp: 180 tablet, Rfl: 0   Semaglutide, 2 MG/DOSE, (OZEMPIC, 2 MG/DOSE,) 8 MG/3ML SOPN, Inject 2 mg into the skin once a week. Monday, Disp: , Rfl:    sildenafil (VIAGRA) 100 MG tablet, TAKE 1/2 TO 1 (ONE-HALF TO ONE) TABLET BY MOUTH ONCE DAILY AS NEEDED FOR ERECTILE  DYSFUNCTION, Disp: 25 tablet, Rfl: 0   spironolactone (ALDACTONE) 25 MG tablet, Take 1 tablet (25 mg total) by mouth daily., Disp: 90 tablet, Rfl: 3   tadalafil (CIALIS) 20 MG tablet, Take 20 mg by mouth daily as needed., Disp: , Rfl:    Vitamin D, Ergocalciferol, (DRISDOL) 1.25 MG (50000 UNIT) CAPS capsule, Take 1 capsule (50,000 Units total) by mouth once a week., Disp: 5 capsule, Rfl: 2   albuterol (VENTOLIN HFA) 108 (90 Base) MCG/ACT inhaler, Inhale 2 puffs into the lungs every 6 (six) hours as needed for wheezing or shortness of breath., Disp: 18 g, Rfl: 1   colchicine 0.6 MG tablet, Daily for 7 days then daily PRN for gout flare (Patient not taking: Reported on 07/31/2023), Disp: 30 tablet, Rfl: 0   ferrous sulfate 325 (65 FE) MG tablet, Take 1 tablet (325 mg total) by mouth every other day. (Patient not taking: Reported on 06/26/2023), Disp: 30 tablet, Rfl: 1   metolazone (ZAROXOLYN) 2.5 MG tablet, Take 1 tablet (2.5 mg total) by mouth as directed. (Patient not taking: Reported on 01/29/2023), Disp: 5 tablet, Rfl: 2   mupirocin ointment (BACTROBAN) 2 %, Apply 1 Application topically 2 (two) times daily. (Patient not taking: Reported on 07/25/2023),  Disp: 22 g, Rfl: 0   Oxycodone HCl 10 MG TABS, Take 10 mg by mouth 3 (three) times daily. (Patient not taking: Reported on 07/25/2023), Disp: , Rfl:    QUEtiapine (SEROQUEL) 300 MG tablet, Take 1.5 tablets (450 mg total) by mouth at bedtime., Disp: 135 tablet, Rfl: 1   zinc gluconate 50 MG tablet, Take 50 mg by mouth daily. (Patient not taking: Reported on 07/25/2023), Disp: , Rfl:  No Known Allergies   Social History   Socioeconomic History   Marital status: Legally Separated    Spouse name: Not on file   Number of children: Not on file   Years of education: Not on file   Highest education level: Not on file  Occupational History   Not on file  Tobacco Use   Smoking status: Never    Passive exposure: Never   Smokeless tobacco: Never  Vaping Use   Vaping status: Never Used  Substance and Sexual Activity   Alcohol use: Not Currently    Alcohol/week: 2.0 standard drinks of alcohol    Types: 2 Cans of beer per week   Drug use: No   Sexual activity: Yes    Birth control/protection: None  Other Topics Concern   Not on file  Social History Narrative   Not on file   Social Determinants of Health   Financial Resource Strain: Medium Risk (05/24/2023)   Overall Financial Resource Strain (CARDIA)    Difficulty of Paying Living Expenses: Somewhat hard  Food Insecurity: Food Insecurity Present (06/07/2023)   Hunger Vital Sign    Worried About Running Out of Food in the Last Year: Sometimes true    Ran Out of Food in the Last Year: Sometimes true  Transportation Needs: No Transportation Needs (05/24/2023)   PRAPARE - Administrator, Civil Service (Medical): No    Lack of Transportation (Non-Medical): No  Physical Activity: Sufficiently Active (05/24/2023)   Exercise Vital Sign    Days of Exercise per Week: 7 days    Minutes of Exercise per Session: 30 min  Stress: No Stress Concern Present (05/24/2023)   [employer] of Occupational Health - Occupational Stress  Questionnaire    Feeling of Stress : Not at all  Social Connections: Moderately Isolated (05/24/2023)   Social Connection and Isolation Panel [NHANES]    Frequency of Communication with Friends and Family: More than three times a week    Frequency of Social Gatherings with Friends and Family: More than three times a week    Attends Religious Services: Never    Database administrator or Organizations: No    Attends Banker Meetings: Never    Marital Status: Living with partner  Intimate Partner Violence: Not At Risk (05/24/2023)   Humiliation, Afraid, Rape, and Kick questionnaire    Fear of Current or Ex-Partner: No    Emotionally Abused: No    Physically Abused: No    Sexually Abused: No    Physical Exam      Future Appointments  Date Time Provider Department Center  08/23/2023  1:40 PM Ivonne Andrew, NP [facility] None  08/28/2023  3:30 PM [facility] PA/NP [facility] None  09/03/2023  7:40 AM [facility] DEVICE REMOTES [facility]  12/04/2023  7:40 AM [facility] DEVICE REMOTES [facility]  03/05/2024  7:40 AM [facility] DEVICE REMOTES [facility]  05/29/2024  1:00 PM [facility]-ANNUAL WELLNESS VISIT [facility] None  06/05/2024  7:40 AM [facility] DEVICE REMOTES [facility]  09/05/2024  7:40 AM [facility] DEVICE REMOTES [facility]

## 2023-08-23 ENCOUNTER — Ambulatory Visit (INDEPENDENT_AMBULATORY_CARE_PROVIDER_SITE_OTHER): Payer: Medicare Other | Admitting: Nurse Practitioner

## 2023-08-23 ENCOUNTER — Encounter: Payer: Self-pay | Admitting: Nurse Practitioner

## 2023-08-23 VITALS — BP 149/90 | HR 78 | Ht 74.0 in | Wt 292.0 lb

## 2023-08-23 DIAGNOSIS — H547 Unspecified visual loss: Secondary | ICD-10-CM | POA: Diagnosis not present

## 2023-08-23 DIAGNOSIS — E1165 Type 2 diabetes mellitus with hyperglycemia: Secondary | ICD-10-CM | POA: Diagnosis not present

## 2023-08-23 DIAGNOSIS — Z794 Long term (current) use of insulin: Secondary | ICD-10-CM | POA: Diagnosis not present

## 2023-08-23 LAB — POCT GLYCOSYLATED HEMOGLOBIN (HGB A1C): HbA1c, POC (controlled diabetic range): 7 % (ref 0.0–7.0)

## 2023-08-23 NOTE — Progress Notes (Signed)
Subjective   Patient ID: [name], male    DOB: 04-29-1961, 62 y.o.   MRN: [id number]  Chief Complaint  Patient presents with   Diabetes    DM f/u.  Requesting a handicap placard. Requesting referral to an opthalmologist    Referring provider: Ivonne Andrew, NP  [name] is a 62 y.o. male with Past Medical History: No date: AICD (automatic cardioverter/defibrillator) present No date: Anxiety No date: Atrial fibrillation (HCC) No date: CAD (coronary artery disease) No date: Cardiomyopathy (HCC) 09/2019: CHF (congestive heart failure) (HCC) No date: Depression No date: Diabetes mellitus 11/2019: Erectile dysfunction No date: GI bleeding 09/2019: H/O right heart catheterization No date: Hypertension No date: Presence of permanent cardiac pacemaker 10/27/2021: Presence of Watchman left atrial appendage closure device     Comment:  27 mm Watchman Flex Device per Dr. Lalla Brothers No date: Schizophrenia (HCC) No date: Sleep apnea     Comment:  uses cpap   HPI  Patient presents today for diabetes follow-up.  A1c in office today is 7.0.  Pharmacist is following patient for diabetic medication management.  Patient states that he has not been compliant with diet.  He states that he will do better about limiting concentrated sweets.  Blood pressure was slightly elevated in office today.  Patient states that he did eat to bacon sandwiches last night. Denies chest pain or edema   No Known Allergies  Immunization History  Administered Date(s) Administered   Influenza,inj,Quad PF,6+ Mos 10/13/2019, 09/13/2020, 08/03/2021, 11/01/2022   PFIZER(Purple Top)SARS-COV-2 Vaccination 03/13/2020   Pneumococcal Polysaccharide-23 10/16/2019   Tdap 09/13/2020   Zoster Recombinant(Shingrix) 08/03/2021    Tobacco History: Social History   Tobacco Use  Smoking Status Never   Passive exposure: Never  Smokeless Tobacco Never   Counseling given: Not  Answered   Outpatient Encounter Medications as of 08/23/2023  Medication Sig   acetaminophen (TYLENOL) 500 MG tablet Take 2 tablets (1,000 mg total) by mouth 2 (two) times daily as needed. (Patient taking differently: Take 1,000 mg by mouth 2 (two) times daily as needed. Takes 1000mg  morning and night.)   aspirin EC 81 MG tablet Take 1 tablet (81 mg total) by mouth daily. Swallow whole.   atorvastatin (LIPITOR) 40 MG tablet Take 1 tablet (40 mg total) by mouth daily.   blood glucose meter kit and supplies KIT Dispense based on patient and insurance preference. Use up to four times daily as directed. (FOR ICD-9 250.00, 250.01).   Continuous Glucose Receiver (FREESTYLE LIBRE 3 READER) DEVI 1 each by Does not apply route daily. Use to check glucose continuously   Continuous Glucose Sensor (FREESTYLE LIBRE 3 SENSOR) MISC Place 1 sensor on the skin every 14 days. Use to check glucose continuously   DULoxetine (CYMBALTA) 30 MG capsule Take 1 capsule by mouth once daily   empagliflozin (JARDIANCE) 10 MG TABS tablet TAKE 1 TABLET BY MOUTH ONCE DAILY .   escitalopram (LEXAPRO) 10 MG tablet Take 10 mg by mouth daily. Prescribed by Dr. Elvera Lennox. Dimkpa   furosemide (LASIX) 40 MG tablet Take 1.5 tablets (60 mg total) by mouth 2 (two) times daily.   gabapentin (NEURONTIN) 800 MG tablet Take 1 tablet (800 mg total) by mouth 3 (three) times daily.   HUMALOG MIX 75/25 KWIKPEN (75-25) 100 UNIT/ML KwikPen DIAL AND INJECT 30 UNITS UNDER THE SKIN TWICE DAILY   metFORMIN (GLUCOPHAGE-XR) 500 MG 24 hr tablet Take 1 tablet (500 mg total) by mouth 2 (two) times daily  with a meal.   metoprolol succinate (TOPROL-XL) 50 MG 24 hr tablet Take 1 tablet (50 mg total) by mouth daily.   Multiple Vitamins-Minerals (CENTRUM SILVER 50+MEN) TABS Take 1 tablet by mouth daily.   pantoprazole (PROTONIX) 40 MG tablet Take 1 tablet by mouth once daily   potassium chloride SA (KLOR-CON M) 20 MEQ tablet Take 2 tablets (40 mEq total) by mouth  daily.   RELION PEN NEEDLES 31G X 6 MM MISC INJECT 1 PEN INTO THE SKIN 2 TIMES DAILY   sacubitril-valsartan (ENTRESTO) 24-26 MG Take 1 tablet by mouth 2 (two) times daily. NEEDS FOLLOW UP APPOINTMENT FOR MORE REFILLS   Semaglutide, 2 MG/DOSE, (OZEMPIC, 2 MG/DOSE,) 8 MG/3ML SOPN Inject 2 mg into the skin once a week. Monday   sildenafil (VIAGRA) 100 MG tablet TAKE 1/2 TO 1 (ONE-HALF TO ONE) TABLET BY MOUTH ONCE DAILY AS NEEDED FOR ERECTILE DYSFUNCTION   spironolactone (ALDACTONE) 25 MG tablet Take 1 tablet (25 mg total) by mouth daily.   Vitamin D, Ergocalciferol, (DRISDOL) 1.25 MG (50000 UNIT) CAPS capsule Take 1 capsule (50,000 Units total) by mouth once a week.   zinc gluconate 50 MG tablet Take 50 mg by mouth daily.   albuterol (VENTOLIN HFA) 108 (90 Base) MCG/ACT inhaler Inhale 2 puffs into the lungs every 6 (six) hours as needed for wheezing or shortness of breath.   colchicine 0.6 MG tablet Daily for 7 days then daily PRN for gout flare (Patient not taking: Reported on 07/31/2023)   diclofenac Sodium (VOLTAREN) 1 % GEL Apply 2 g topically daily as needed for pain. (Patient not taking: Reported on 08/23/2023)   ferrous sulfate 325 (65 FE) MG tablet Take 1 tablet (325 mg total) by mouth every other day. (Patient not taking: Reported on 06/26/2023)   metolazone (ZAROXOLYN) 2.5 MG tablet Take 1 tablet (2.5 mg total) by mouth as directed. (Patient not taking: Reported on 01/29/2023)   mupirocin ointment (BACTROBAN) 2 % Apply 1 Application topically 2 (two) times daily. (Patient not taking: Reported on 07/25/2023)   Omega-3 Fatty Acids (FISH OIL) 1000 MG CAPS Take 1,000 mg by mouth at bedtime. (Patient not taking: Reported on 08/23/2023)   Oxycodone HCl 10 MG TABS Take 10 mg by mouth 3 (three) times daily. (Patient not taking: Reported on 07/25/2023)   QUEtiapine (SEROQUEL) 300 MG tablet Take 1.5 tablets (450 mg total) by mouth at bedtime.   tadalafil (CIALIS) 20 MG tablet Take 20 mg by mouth daily as  needed. (Patient not taking: Reported on 08/23/2023)   No facility-administered encounter medications on file as of 08/23/2023.    Review of Systems  Review of Systems  Constitutional: Negative.   HENT: Negative.    Cardiovascular: Negative.   Gastrointestinal: Negative.   Allergic/Immunologic: Negative.   Neurological: Negative.   Psychiatric/Behavioral: Negative.       Objective:   BP (!) 149/90 (BP Location: Left Arm, Patient Position: Sitting, Cuff Size: Normal)   Pulse 78   Ht 6\' 2"  (1.88 m)   Wt 292 lb (132.5 kg)   SpO2 98%   BMI 37.49 kg/m   Wt Readings from Last 5 Encounters:  08/23/23 292 lb (132.5 kg)  08/22/23 293 lb (132.9 kg)  08/15/23 289 lb 3.2 oz (131.2 kg)  08/07/23 289 lb (131.1 kg)  07/25/23 285 lb (129.3 kg)     Physical Exam Vitals and nursing note reviewed.  Constitutional:      General: He is not in acute distress.  Appearance: He is well-developed.  Cardiovascular:     Rate and Rhythm: Normal rate and regular rhythm.  Pulmonary:     Effort: Pulmonary effort is normal.     Breath sounds: Normal breath sounds.  Skin:    General: Skin is warm and dry.  Neurological:     Mental Status: He is alert and oriented to person, place, and time.       Assessment & Plan:   Decreased vision -     Ambulatory referral to Ophthalmology  Type 2 diabetes mellitus with hyperglycemia, with long-term current use of insulin (HCC) -     CBC -     Comprehensive metabolic panel     No follow-ups on file.   Ivonne Andrew, NP 08/23/2023

## 2023-08-23 NOTE — Patient Instructions (Signed)
1. Decreased vision  - Ambulatory referral to Ophthalmology  2. Type 2 diabetes mellitus with hyperglycemia, with long-term current use of insulin (HCC)  - CBC - Comprehensive metabolic panel - POCT glycosylated hemoglobin (Hb A1C)  Follow up:  Follow up in 3 months

## 2023-08-24 LAB — COMPREHENSIVE METABOLIC PANEL
ALT: 26 [IU]/L (ref 0–44)
AST: 23 [IU]/L (ref 0–40)
Albumin: 4.2 g/dL (ref 3.9–4.9)
Alkaline Phosphatase: 112 [IU]/L (ref 44–121)
BUN/Creatinine Ratio: 12 (ref 10–24)
BUN: 14 mg/dL (ref 8–27)
Bilirubin Total: 0.2 mg/dL (ref 0.0–1.2)
CO2: 24 mmol/L (ref 20–29)
Calcium: 9.6 mg/dL (ref 8.6–10.2)
Chloride: 103 mmol/L (ref 96–106)
Creatinine, Ser: 1.19 mg/dL (ref 0.76–1.27)
Globulin, Total: 3.6 g/dL (ref 1.5–4.5)
Glucose: 139 mg/dL — ABNORMAL HIGH (ref 70–99)
Potassium: 5.4 mmol/L — ABNORMAL HIGH (ref 3.5–5.2)
Sodium: 141 mmol/L (ref 134–144)
Total Protein: 7.8 g/dL (ref 6.0–8.5)
eGFR: 69 mL/min/{1.73_m2} (ref >59)

## 2023-08-24 LAB — CBC
Hematocrit: 46.4 % (ref 37.5–51.0)
Hemoglobin: 14.1 g/dL (ref 13.0–17.7)
MCH: 25.6 pg — ABNORMAL LOW (ref 26.6–33.0)
MCHC: 30.4 g/dL — ABNORMAL LOW (ref 31.5–35.7)
MCV: 84 fL (ref 79–97)
Platelets: 290 10*3/uL (ref 150–450)
RBC: 5.5 x10E6/uL (ref 4.14–5.80)
RDW: 13.2 % (ref 11.6–15.4)
WBC: 7.4 10*3/uL (ref 3.4–10.8)

## 2023-08-27 ENCOUNTER — Telehealth (HOSPITAL_COMMUNITY): Payer: Self-pay

## 2023-08-27 ENCOUNTER — Telehealth (HOSPITAL_COMMUNITY): Payer: Self-pay | Admitting: Emergency Medicine

## 2023-08-27 NOTE — Telephone Encounter (Signed)
Called [name] to remind him of his appt tomorrow. Pt did not answer so I left a detailed message advising time and items to bring.   Will follow up with an additional reminder tomorrow and meet him at his clinic meeting.   [name] EMT-P Community Paramedic  [phone]

## 2023-08-27 NOTE — Telephone Encounter (Signed)
Called and left patient a voice message to confirm/remind patient of their appointment at the [facility] on 08/28/23.   And to bring in all medications and/or complete list.

## 2023-08-28 ENCOUNTER — Telehealth (HOSPITAL_COMMUNITY): Payer: Self-pay | Admitting: Emergency Medicine

## 2023-08-28 ENCOUNTER — Encounter (HOSPITAL_COMMUNITY): Payer: Medicare Other

## 2023-08-28 ENCOUNTER — Telehealth (HOSPITAL_COMMUNITY): Payer: Self-pay

## 2023-08-28 NOTE — Telephone Encounter (Signed)
Attempted appointment reminder.   No answer  [name] EMT-P Community Paramedic  [phone]

## 2023-08-28 NOTE — Telephone Encounter (Signed)
Pt returned my call and advised he would not be able to make it to the [facility] appointment today. Pt was informed to call triage and cancel so they can reschedule him.  Pt called back shortly after and advised he was unable to call the [facility] triage.   MSG was sent to [facility] and she was notified of the cancellation.   [name] EMT-P Community Paramedic  [phone]

## 2023-08-28 NOTE — Telephone Encounter (Signed)
I attempted to call [name] to remind him of his upcoming appointment today in the HF clinic with no answer and unable to leave a VM. I will continue to reach out.   [name], EMT-Paramedic [phone] 08/28/2023

## 2023-08-28 NOTE — Telephone Encounter (Signed)
Attempted to call [name] to remind him of appointment today with the [facility]. Pt did not answer and unable to leave VM.  Will continue to follow up.   [name] EMT-P Community Paramedic  [phone]

## 2023-08-29 ENCOUNTER — Telehealth (HOSPITAL_COMMUNITY): Payer: Self-pay | Admitting: Emergency Medicine

## 2023-08-29 NOTE — Telephone Encounter (Signed)
Called Mr. Cuadrado 2x times today to confirm our home visit since he missed his clinic appointment yesterday.  @ 1123 and 1306  No answer either time and unable to leave a message,   Will follow up tomorrow   [name] EMT-P Community Paramedic  [phone]

## 2023-08-30 ENCOUNTER — Telehealth: Payer: Self-pay | Admitting: Pharmacist

## 2023-08-30 ENCOUNTER — Other Ambulatory Visit (HOSPITAL_COMMUNITY): Payer: Self-pay | Admitting: Emergency Medicine

## 2023-08-30 NOTE — Progress Notes (Signed)
Paramedicine Encounter    Patient ID: [name], male    DOB: 1961-06-05, 62 y.o.   MRN: [id number]   Complaints - feeling under the weather  Assessment - lost approximately 9lbs in a week - reports "cleaned himself out with a laxative". No edema noted, lung sounds clear  Compliance with meds - no missed doses noted   Pill box filled - for 1x week    Refills needed - CGM, Vit D, spironolactone, furosemide, escitalopram    Meds changes since last visit - none    Social changes - none    VISIT SUMMARY**  Met with [name] in the home today. Pt advised that he was feeling under the weather, but would not go into further detail. Pt denied any shortness of breath, chest pain, palpitations, or dizziness. Pt was assessed as noted. Pt's pill box was filled for 1x week. Pt was informed to reschedule his [facility] appointment and was given the number in 2 places to be successful. Will follow up in 1x week.  BP 120/68   Pulse 64   Wt 284 lb (128.8 kg)   SpO2 97%   BMI 36.46 kg/m  Weight yesterday-DNW Last visit weight-293 lbs CBG: 110   [name] EMT-P Community Paramedic  [phone]     ACTION: Home visit completed     Patient Care Team: Ivonne Andrew, NP as PCP - General (Pulmonary Disease) Quintella Reichert, MD as PCP - Cardiology (Cardiology) Laurey Morale, MD as PCP - Advanced Heart Failure (Cardiology) Lanier Prude, MD as PCP - Electrophysiology (Cardiology) Burna Sis, LCSW as Social Worker (Licensed Clinical Social Worker) Tressia Danas, MD (Inactive) as Consulting Physician (Gastroenterology)  Patient Active Problem List   Diagnosis Date Noted   Acute right-sided low back pain with right-sided sciatica 05/14/2023   Multiple joint pain 12/22/2022   Obesity (BMI 35.0-39.9 without comorbidity) 07/18/2022   Cellulitis 06/22/2022   Cellulitis, leg 06/20/2022   Presence of Watchman left atrial appendage closure device 10/28/2021   Atrial  fibrillation (HCC) 10/27/2021   Melena    Chronic diastolic CHF (congestive heart failure) (HCC)    GI bleed 06/29/2021   Severe anemia 06/29/2021   Adenomatous polyp of ascending colon    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   Acute blood loss anemia    Angiodysplasia of stomach    Chronic anticoagulation    CHF exacerbation (HCC) 06/15/2020   AKI (acute kidney injury) (HCC) 06/15/2020   CKD (chronic kidney disease), stage III (HCC) 06/15/2020   Anemia due to chronic blood loss    Gastric AVM    Angiodysplasia of duodenum with hemorrhage    Chronic systolic CHF (congestive heart failure) (HCC) 04/05/2020   Anemia 04/05/2020   PAF (paroxysmal atrial fibrillation) (HCC) 04/05/2020   OSA (obstructive sleep apnea) 04/05/2020   Syncope and collapse 04/05/2020   Type 2 diabetes mellitus with hyperglycemia, with long-term current use of insulin (HCC) 12/03/2019   Diabetic polyneuropathy associated with type 2 diabetes mellitus (HCC) 12/03/2019   Erectile dysfunction 12/03/2019   Paranoid schizophrenia, chronic condition (HCC) 01/26/2015   Severe recurrent major depressive disorder with psychotic features (HCC) 01/26/2015   GAD (generalized anxiety disorder) 01/26/2015   OCD (obsessive compulsive disorder) 01/26/2015   Panic disorder without agoraphobia 01/26/2015   Insomnia 01/26/2015    Current Outpatient Medications:    acetaminophen (TYLENOL) 500 MG tablet, Take 2 tablets (1,000 mg total) by mouth 2 (two) times daily as needed. (Patient  taking differently: Take 1,000 mg by mouth 2 (two) times daily as needed. Takes 1000mg  morning and night.), Disp: 180 tablet, Rfl: 3   aspirin EC 81 MG tablet, Take 1 tablet (81 mg total) by mouth daily. Swallow whole., Disp: 90 tablet, Rfl: 3   atorvastatin (LIPITOR) 40 MG tablet, Take 1 tablet (40 mg total) by mouth daily., Disp: 90 tablet, Rfl: 3   blood glucose meter kit and supplies KIT, Dispense based on patient and  insurance preference. Use up to four times daily as directed. (FOR ICD-9 250.00, 250.01)., Disp: 1 each, Rfl: 0   Continuous Glucose Receiver (FREESTYLE LIBRE 3 READER) DEVI, 1 each by Does not apply route daily. Use to check glucose continuously, Disp: 1 each, Rfl: 1   Continuous Glucose Sensor (FREESTYLE LIBRE 3 SENSOR) MISC, Place 1 sensor on the skin every 14 days. Use to check glucose continuously, Disp: 6 each, Rfl: 2   DULoxetine (CYMBALTA) 30 MG capsule, Take 1 capsule by mouth once daily, Disp: 30 capsule, Rfl: 2   empagliflozin (JARDIANCE) 10 MG TABS tablet, TAKE 1 TABLET BY MOUTH ONCE DAILY ., Disp: 90 tablet, Rfl: 3   escitalopram (LEXAPRO) 10 MG tablet, Take 10 mg by mouth daily. Prescribed by Dr. Elvera Lennox. Dimkpa, Disp: , Rfl:    furosemide (LASIX) 40 MG tablet, Take 1.5 tablets (60 mg total) by mouth 2 (two) times daily., Disp: 200 tablet, Rfl: 5   gabapentin (NEURONTIN) 800 MG tablet, Take 1 tablet (800 mg total) by mouth 3 (three) times daily., Disp: 90 tablet, Rfl: 5   HUMALOG MIX 75/25 KWIKPEN (75-25) 100 UNIT/ML KwikPen, DIAL AND INJECT 30 UNITS UNDER THE SKIN TWICE DAILY, Disp: 75 mL, Rfl: 0   metFORMIN (GLUCOPHAGE-XR) 500 MG 24 hr tablet, Take 1 tablet (500 mg total) by mouth 2 (two) times daily with a meal., Disp: 180 tablet, Rfl: 1   metoprolol succinate (TOPROL-XL) 50 MG 24 hr tablet, Take 1 tablet (50 mg total) by mouth daily., Disp: 180 tablet, Rfl: 3   Multiple Vitamins-Minerals (CENTRUM SILVER 50+MEN) TABS, Take 1 tablet by mouth daily., Disp: , Rfl:    Omega-3 Fatty Acids (FISH OIL) 1000 MG CAPS, Take 1,000 mg by mouth at bedtime., Disp: , Rfl:    pantoprazole (PROTONIX) 40 MG tablet, Take 1 tablet by mouth once daily, Disp: 90 tablet, Rfl: 0   potassium chloride SA (KLOR-CON M) 20 MEQ tablet, Take 2 tablets (40 mEq total) by mouth daily., Disp: 60 tablet, Rfl: 6   RELION PEN NEEDLES 31G X 6 MM MISC, INJECT 1 PEN INTO THE SKIN 2 TIMES DAILY, Disp: 100 each, Rfl: 0    sacubitril-valsartan (ENTRESTO) 24-26 MG, Take 1 tablet by mouth 2 (two) times daily. NEEDS FOLLOW UP APPOINTMENT FOR MORE REFILLS, Disp: 180 tablet, Rfl: 0   Semaglutide, 2 MG/DOSE, (OZEMPIC, 2 MG/DOSE,) 8 MG/3ML SOPN, Inject 2 mg into the skin once a week. Monday, Disp: , Rfl:    sildenafil (VIAGRA) 100 MG tablet, TAKE 1/2 TO 1 (ONE-HALF TO ONE) TABLET BY MOUTH ONCE DAILY AS NEEDED FOR ERECTILE DYSFUNCTION, Disp: 25 tablet, Rfl: 0   spironolactone (ALDACTONE) 25 MG tablet, Take 1 tablet (25 mg total) by mouth daily., Disp: 90 tablet, Rfl: 3   Vitamin D, Ergocalciferol, (DRISDOL) 1.25 MG (50000 UNIT) CAPS capsule, Take 1 capsule (50,000 Units total) by mouth once a week., Disp: 5 capsule, Rfl: 2   albuterol (VENTOLIN HFA) 108 (90 Base) MCG/ACT inhaler, Inhale 2 puffs into the lungs every  6 (six) hours as needed for wheezing or shortness of breath., Disp: 18 g, Rfl: 1   colchicine 0.6 MG tablet, Daily for 7 days then daily PRN for gout flare (Patient not taking: Reported on 07/31/2023), Disp: 30 tablet, Rfl: 0   diclofenac Sodium (VOLTAREN) 1 % GEL, Apply 2 g topically daily as needed for pain. (Patient not taking: Reported on 08/23/2023), Disp: , Rfl:    ferrous sulfate 325 (65 FE) MG tablet, Take 1 tablet (325 mg total) by mouth every other day. (Patient not taking: Reported on 06/26/2023), Disp: 30 tablet, Rfl: 1   metolazone (ZAROXOLYN) 2.5 MG tablet, Take 1 tablet (2.5 mg total) by mouth as directed. (Patient not taking: Reported on 01/29/2023), Disp: 5 tablet, Rfl: 2   mupirocin ointment (BACTROBAN) 2 %, Apply 1 Application topically 2 (two) times daily. (Patient not taking: Reported on 08/30/2023), Disp: 22 g, Rfl: 0   Oxycodone HCl 10 MG TABS, Take 10 mg by mouth 3 (three) times daily. (Patient not taking: Reported on 07/25/2023), Disp: , Rfl:    QUEtiapine (SEROQUEL) 300 MG tablet, Take 1.5 tablets (450 mg total) by mouth at bedtime., Disp: 135 tablet, Rfl: 1   tadalafil (CIALIS) 20 MG tablet, Take 20  mg by mouth daily as needed. (Patient not taking: Reported on 08/23/2023), Disp: , Rfl:    zinc gluconate 50 MG tablet, Take 50 mg by mouth daily., Disp: , Rfl:  No Known Allergies   Social History   Socioeconomic History   Marital status: Legally Separated    Spouse name: Not on file   Number of children: Not on file   Years of education: Not on file   Highest education level: Not on file  Occupational History   Not on file  Tobacco Use   Smoking status: Never    Passive exposure: Never   Smokeless tobacco: Never  Vaping Use   Vaping status: Never Used  Substance and Sexual Activity   Alcohol use: Not Currently    Alcohol/week: 2.0 standard drinks of alcohol    Types: 2 Cans of beer per week   Drug use: No   Sexual activity: Yes    Birth control/protection: None  Other Topics Concern   Not on file  Social History Narrative   Not on file   Social Determinants of Health   Financial Resource Strain: Medium Risk (05/24/2023)   Overall Financial Resource Strain (CARDIA)    Difficulty of Paying Living Expenses: Somewhat hard  Food Insecurity: Food Insecurity Present (06/07/2023)   Hunger Vital Sign    Worried About Running Out of Food in the Last Year: Sometimes true    Ran Out of Food in the Last Year: Sometimes true  Transportation Needs: No Transportation Needs (05/24/2023)   PRAPARE - Administrator, Civil Service (Medical): No    Lack of Transportation (Non-Medical): No  Physical Activity: Sufficiently Active (05/24/2023)   Exercise Vital Sign    Days of Exercise per Week: 7 days    Minutes of Exercise per Session: 30 min  Stress: No Stress Concern Present (05/24/2023)   [employer] of Occupational Health - Occupational Stress Questionnaire    Feeling of Stress : Not at all  Social Connections: Moderately Isolated (05/24/2023)   Social Connection and Isolation Panel [NHANES]    Frequency of Communication with Friends and Family: More than three times a  week    Frequency of Social Gatherings with Friends and Family: More than three times a  week    Attends Religious Services: Never    Active Member of Clubs or Organizations: No    Attends Banker Meetings: Never    Marital Status: Living with partner  Intimate Partner Violence: Not At Risk (05/24/2023)   Humiliation, Afraid, Rape, and Kick questionnaire    Fear of Current or Ex-Partner: No    Emotionally Abused: No    Physically Abused: No    Sexually Abused: No    Physical Exam      Future Appointments  Date Time Provider Department Center  09/03/2023  7:40 AM [facility] DEVICE REMOTES [facility]  11/23/2023  1:40 PM Ivonne Andrew, NP [facility] None  12/04/2023  7:40 AM [facility] DEVICE REMOTES [facility]  03/05/2024  7:40 AM [facility] DEVICE REMOTES [facility]  05/29/2024  1:00 PM [facility]-ANNUAL WELLNESS VISIT [facility] None  06/05/2024  7:40 AM [facility] DEVICE REMOTES [facility]  09/05/2024  7:40 AM [facility] DEVICE REMOTES [facility]

## 2023-08-30 NOTE — Telephone Encounter (Signed)
Shipment of Ozempic from [facility] arrived. Pt called and LVM per DPR that it is here for pick up.

## 2023-09-03 ENCOUNTER — Ambulatory Visit: Payer: Medicare Other

## 2023-09-03 DIAGNOSIS — I5022 Chronic systolic (congestive) heart failure: Secondary | ICD-10-CM | POA: Diagnosis not present

## 2023-09-03 DIAGNOSIS — I480 Paroxysmal atrial fibrillation: Secondary | ICD-10-CM

## 2023-09-04 LAB — CUP PACEART REMOTE DEVICE CHECK
Date Time Interrogation Session: 20241007125958
Implantable Lead Connection Status: 753985
Implantable Lead Implant Date: 20211008
Implantable Lead Location: 753860
Implantable Lead Model: 436910
Implantable Lead Serial Number: 81404997
Implantable Pulse Generator Implant Date: 20211008
Pulse Gen Model: 429525
Pulse Gen Serial Number: 84810752

## 2023-09-05 ENCOUNTER — Other Ambulatory Visit: Payer: Self-pay | Admitting: Nurse Practitioner

## 2023-09-05 MED ORDER — FREESTYLE LIBRE 3 SENSOR MISC: 2 refills | Status: DC

## 2023-09-05 NOTE — Telephone Encounter (Signed)
Caller & Relationship to patient:  MRN #  [id number]   Call Back Number:   Date of Last Office Visit: 08/23/2023     Date of Next Office Visit: 11/23/2023    Medication(s) to be Refilled: Free style to [facility] pharmacy [name]  Preferred Pharmacy:   ** Please notify patient to allow 48-72 hours to process** **Let patient know to contact pharmacy at the end of the day to make sure medication is ready. ** **If patient has not been seen in a year or longer, book an appointment **Advise to use MyChart for refill requests OR to contact their pharmacy

## 2023-09-06 ENCOUNTER — Other Ambulatory Visit: Payer: Self-pay

## 2023-09-06 ENCOUNTER — Telehealth (HOSPITAL_COMMUNITY): Payer: Self-pay | Admitting: Emergency Medicine

## 2023-09-06 MED ORDER — FREESTYLE LIBRE 3 SENSOR MISC: 2 refills | Status: DC

## 2023-09-06 NOTE — Telephone Encounter (Signed)
Called [name] 2x today to confirm our meeting time for his home visit today:   10:28 - no answer, unable to leave a VM 12:52 - no answer, unable to leave a VM  Will continue to try to follow up.   [name] EMT-P Community Paramedic  [phone]

## 2023-09-06 NOTE — Telephone Encounter (Signed)
Done KH 

## 2023-09-06 NOTE — Telephone Encounter (Signed)
Still no response from [place], unable to meet with him or refill his medications this week.   Will follow up next week.   [name] EMT-P Community Paramedic  [phone]

## 2023-09-10 NOTE — Telephone Encounter (Signed)
Called patient to see if he needs sleep medicine appointment to address his cpap mask, no answer.  Unable  to leave message asking recipient to call our office, mailbox full. Letter sent.

## 2023-09-11 ENCOUNTER — Telehealth (HOSPITAL_COMMUNITY): Payer: Self-pay | Admitting: Emergency Medicine

## 2023-09-11 ENCOUNTER — Telehealth (HOSPITAL_COMMUNITY): Payer: Self-pay | Admitting: Cardiology

## 2023-09-11 ENCOUNTER — Other Ambulatory Visit (HOSPITAL_COMMUNITY): Payer: Self-pay | Admitting: Emergency Medicine

## 2023-09-11 NOTE — Progress Notes (Signed)
Paramedicine Encounter    Patient ID: [name], male    DOB: 1961/08/16, 62 y.o.   MRN: [id number]   Complaints - none   Assessment - distended neck veins, no edema, lung sounds clear  Compliance with meds - attempted to self dose based off of bottle instructions, per quantities, it does not appear pt has been fully compliant with medications  Pill box filled - for 1x week.   Refills needed - Escitalopram, fish oil, zinc, freestyle  Meds changes since last visit - none    Social changes - none    VISIT SUMMARY**  Met with [name] in the home. [name] was able to get in touch with me today to catch up with his medications. Pt has been unreachable since last week and I was not able to complete a home visit or fill his pill box. Pt reports compliance of medications, but per quantities, unsure of the extent of compliance. Pt was noted to have gained 10 pounds since our last visit. Pt reported that he just ate [place].Pt reported eating [employer] yesterday with left overs as well. Pt did not present with any pedal edema and lung sounds were noted to be clear. I did note some distended jugular veins upon compression of his abdomen.   Made a phone call to triage for further guidance.  Pill box filled for 1x week. Pt urged to make appointment with the heart failure clinic since he's missed his last few. Pt advised he would. Will follow up with patient in 1x week or sooner if needed by response of triage.    BP 108/68   Pulse 68   Wt 294 lb 3.2 oz (133.4 kg)   SpO2 94%   BMI 37.77 kg/m  Weight yesterday-DNW Last visit weight-284 lbs  [name] EMT-P Community Paramedic  [phone]     ACTION: Home visit completed     Patient Care Team: Ivonne Andrew, NP as PCP - General (Pulmonary Disease) Quintella Reichert, MD as PCP - Cardiology (Cardiology) Laurey Morale, MD as PCP - Advanced Heart Failure (Cardiology) Lanier Prude, MD as PCP -  Electrophysiology (Cardiology) Burna Sis, LCSW as Social Worker (Licensed Clinical Social Worker) Tressia Danas, MD (Inactive) as Consulting Physician (Gastroenterology)  Patient Active Problem List   Diagnosis Date Noted   Acute right-sided low back pain with right-sided sciatica 05/14/2023   Multiple joint pain 12/22/2022   Obesity (BMI 35.0-39.9 without comorbidity) 07/18/2022   Cellulitis 06/22/2022   Cellulitis, leg 06/20/2022   Presence of Watchman left atrial appendage closure device 10/28/2021   Atrial fibrillation (HCC) 10/27/2021   Melena    Chronic diastolic CHF (congestive heart failure) (HCC)    GI bleed 06/29/2021   Severe anemia 06/29/2021   Adenomatous polyp of ascending colon    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   Acute blood loss anemia    Angiodysplasia of stomach    Chronic anticoagulation    CHF exacerbation (HCC) 06/15/2020   AKI (acute kidney injury) (HCC) 06/15/2020   CKD (chronic kidney disease), stage III (HCC) 06/15/2020   Anemia due to chronic blood loss    Gastric AVM    Angiodysplasia of duodenum with hemorrhage    Chronic systolic CHF (congestive heart failure) (HCC) 04/05/2020   Anemia 04/05/2020   PAF (paroxysmal atrial fibrillation) (HCC) 04/05/2020   OSA (obstructive sleep apnea) 04/05/2020   Syncope and collapse 04/05/2020   Type 2 diabetes mellitus with hyperglycemia, with long-term  current use of insulin (HCC) 12/03/2019   Diabetic polyneuropathy associated with type 2 diabetes mellitus (HCC) 12/03/2019   Erectile dysfunction 12/03/2019   Paranoid schizophrenia, chronic condition (HCC) 01/26/2015   Severe recurrent major depressive disorder with psychotic features (HCC) 01/26/2015   GAD (generalized anxiety disorder) 01/26/2015   OCD (obsessive compulsive disorder) 01/26/2015   Panic disorder without agoraphobia 01/26/2015   Insomnia 01/26/2015    Current Outpatient Medications:    acetaminophen  (TYLENOL) 500 MG tablet, Take 2 tablets (1,000 mg total) by mouth 2 (two) times daily as needed. (Patient taking differently: Take 1,000 mg by mouth 2 (two) times daily as needed. Takes 1000mg  morning and night.), Disp: 180 tablet, Rfl: 3   aspirin EC 81 MG tablet, Take 1 tablet (81 mg total) by mouth daily. Swallow whole., Disp: 90 tablet, Rfl: 3   atorvastatin (LIPITOR) 40 MG tablet, Take 1 tablet (40 mg total) by mouth daily., Disp: 90 tablet, Rfl: 3   blood glucose meter kit and supplies KIT, Dispense based on patient and insurance preference. Use up to four times daily as directed. (FOR ICD-9 250.00, 250.01)., Disp: 1 each, Rfl: 0   Continuous Glucose Receiver (FREESTYLE LIBRE 3 READER) DEVI, 1 each by Does not apply route daily. Use to check glucose continuously, Disp: 1 each, Rfl: 1   Continuous Glucose Sensor (FREESTYLE LIBRE 3 SENSOR) MISC, Place 1 sensor on the skin every 14 days. Use to check glucose continuously, Disp: 6 each, Rfl: 2   DULoxetine (CYMBALTA) 30 MG capsule, Take 1 capsule by mouth once daily, Disp: 30 capsule, Rfl: 2   empagliflozin (JARDIANCE) 10 MG TABS tablet, TAKE 1 TABLET BY MOUTH ONCE DAILY ., Disp: 90 tablet, Rfl: 3   escitalopram (LEXAPRO) 10 MG tablet, Take 10 mg by mouth daily. Prescribed by Dr. Elvera Lennox. Dimkpa, Disp: , Rfl:    ferrous sulfate 325 (65 FE) MG tablet, Take 1 tablet (325 mg total) by mouth every other day., Disp: 30 tablet, Rfl: 1   furosemide (LASIX) 40 MG tablet, Take 1.5 tablets (60 mg total) by mouth 2 (two) times daily., Disp: 200 tablet, Rfl: 5   gabapentin (NEURONTIN) 800 MG tablet, Take 1 tablet (800 mg total) by mouth 3 (three) times daily., Disp: 90 tablet, Rfl: 5   HUMALOG MIX 75/25 KWIKPEN (75-25) 100 UNIT/ML KwikPen, DIAL AND INJECT 30 UNITS UNDER THE SKIN TWICE DAILY, Disp: 75 mL, Rfl: 0   metFORMIN (GLUCOPHAGE-XR) 500 MG 24 hr tablet, Take 1 tablet (500 mg total) by mouth 2 (two) times daily with a meal., Disp: 180 tablet, Rfl: 1   metoprolol  succinate (TOPROL-XL) 50 MG 24 hr tablet, Take 1 tablet (50 mg total) by mouth daily., Disp: 180 tablet, Rfl: 3   Multiple Vitamins-Minerals (CENTRUM SILVER 50+MEN) TABS, Take 1 tablet by mouth daily., Disp: , Rfl:    pantoprazole (PROTONIX) 40 MG tablet, Take 1 tablet by mouth once daily, Disp: 90 tablet, Rfl: 0   potassium chloride SA (KLOR-CON M) 20 MEQ tablet, Take 2 tablets (40 mEq total) by mouth daily., Disp: 60 tablet, Rfl: 6   RELION PEN NEEDLES 31G X 6 MM MISC, INJECT 1 PEN INTO THE SKIN 2 TIMES DAILY, Disp: 100 each, Rfl: 0   sacubitril-valsartan (ENTRESTO) 24-26 MG, Take 1 tablet by mouth 2 (two) times daily. NEEDS FOLLOW UP APPOINTMENT FOR MORE REFILLS, Disp: 180 tablet, Rfl: 0   Semaglutide, 2 MG/DOSE, (OZEMPIC, 2 MG/DOSE,) 8 MG/3ML SOPN, Inject 2 mg into the skin once a  week. Monday, Disp: , Rfl:    sildenafil (VIAGRA) 100 MG tablet, TAKE 1/2 TO 1 (ONE-HALF TO ONE) TABLET BY MOUTH ONCE DAILY AS NEEDED FOR ERECTILE DYSFUNCTION, Disp: 25 tablet, Rfl: 0   spironolactone (ALDACTONE) 25 MG tablet, Take 1 tablet (25 mg total) by mouth daily., Disp: 90 tablet, Rfl: 3   Vitamin D, Ergocalciferol, (DRISDOL) 1.25 MG (50000 UNIT) CAPS capsule, Take 1 capsule (50,000 Units total) by mouth once a week., Disp: 5 capsule, Rfl: 2   albuterol (VENTOLIN HFA) 108 (90 Base) MCG/ACT inhaler, Inhale 2 puffs into the lungs every 6 (six) hours as needed for wheezing or shortness of breath., Disp: 18 g, Rfl: 1   colchicine 0.6 MG tablet, Daily for 7 days then daily PRN for gout flare (Patient not taking: Reported on 07/31/2023), Disp: 30 tablet, Rfl: 0   diclofenac Sodium (VOLTAREN) 1 % GEL, Apply 2 g topically daily as needed for pain. (Patient not taking: Reported on 08/23/2023), Disp: , Rfl:    metolazone (ZAROXOLYN) 2.5 MG tablet, Take 1 tablet (2.5 mg total) by mouth as directed. (Patient not taking: Reported on 01/29/2023), Disp: 5 tablet, Rfl: 2   mupirocin ointment (BACTROBAN) 2 %, Apply 1 Application  topically 2 (two) times daily. (Patient not taking: Reported on 08/30/2023), Disp: 22 g, Rfl: 0   Omega-3 Fatty Acids (FISH OIL) 1000 MG CAPS, Take 1,000 mg by mouth at bedtime. (Patient not taking: Reported on 09/11/2023), Disp: , Rfl:    Oxycodone HCl 10 MG TABS, Take 10 mg by mouth 3 (three) times daily. (Patient not taking: Reported on 07/25/2023), Disp: , Rfl:    QUEtiapine (SEROQUEL) 300 MG tablet, Take 1.5 tablets (450 mg total) by mouth at bedtime., Disp: 135 tablet, Rfl: 1   tadalafil (CIALIS) 20 MG tablet, Take 20 mg by mouth daily as needed. (Patient not taking: Reported on 08/23/2023), Disp: , Rfl:    zinc gluconate 50 MG tablet, Take 50 mg by mouth daily. (Patient not taking: Reported on 09/11/2023), Disp: , Rfl:  No Known Allergies   Social History   Socioeconomic History   Marital status: Legally Separated    Spouse name: Not on file   Number of children: Not on file   Years of education: Not on file   Highest education level: Not on file  Occupational History   Not on file  Tobacco Use   Smoking status: Never    Passive exposure: Never   Smokeless tobacco: Never  Vaping Use   Vaping status: Never Used  Substance and Sexual Activity   Alcohol use: Not Currently    Alcohol/week: 2.0 standard drinks of alcohol    Types: 2 Cans of beer per week   Drug use: No   Sexual activity: Yes    Birth control/protection: None  Other Topics Concern   Not on file  Social History Narrative   Not on file   Social Determinants of Health   Financial Resource Strain: Medium Risk (05/24/2023)   Overall Financial Resource Strain (CARDIA)    Difficulty of Paying Living Expenses: Somewhat hard  Food Insecurity: Food Insecurity Present (06/07/2023)   Hunger Vital Sign    Worried About Running Out of Food in the Last Year: Sometimes true    Ran Out of Food in the Last Year: Sometimes true  Transportation Needs: No Transportation Needs (05/24/2023)   PRAPARE - Scientist, research (physical sciences) (Medical): No    Lack of Transportation (Non-Medical): No  Physical  Activity: Sufficiently Active (05/24/2023)   Exercise Vital Sign    Days of Exercise per Week: 7 days    Minutes of Exercise per Session: 30 min  Stress: No Stress Concern Present (05/24/2023)   [employer] of Occupational Health - Occupational Stress Questionnaire    Feeling of Stress : Not at all  Social Connections: Moderately Isolated (05/24/2023)   Social Connection and Isolation Panel [NHANES]    Frequency of Communication with Friends and Family: More than three times a week    Frequency of Social Gatherings with Friends and Family: More than three times a week    Attends Religious Services: Never    Database administrator or Organizations: No    Attends Banker Meetings: Never    Marital Status: Living with partner  Intimate Partner Violence: Not At Risk (05/24/2023)   Humiliation, Afraid, Rape, and Kick questionnaire    Fear of Current or Ex-Partner: No    Emotionally Abused: No    Physically Abused: No    Sexually Abused: No    Physical Exam      Future Appointments  Date Time Provider Department Center  11/23/2023  1:40 PM Ivonne Andrew, NP [facility] None  12/04/2023  7:40 AM [facility] DEVICE REMOTES [facility]  03/05/2024  7:40 AM [facility] DEVICE REMOTES [facility]  05/29/2024  1:00 PM [facility]NUAL WELLNESS VISIT [facility] None  06/05/2024  7:40 AM [facility] DEVICE REMOTES [facility]  09/05/2024  7:40 AM [facility] DEVICE REMOTES [facility]

## 2023-09-11 NOTE — Telephone Encounter (Signed)
[name] with para medicine called during patients home visit to report 10 lb weight gain in one week. -?medication compliance, unable to meet with para medicine for med boxes x 2-3 weeks -increase in eating out the past week ([place]) -no edema,clear lung sounds -elevated JVP \  Please advise

## 2023-09-11 NOTE — Telephone Encounter (Signed)
I called [name] to schedule home visit. No answer. Left message.   Attempted to contact spouse, but the number was incorrect.  Will continue to try to reach out.  [name] EMT-P Community Paramedic  [phone]

## 2023-09-11 NOTE — Telephone Encounter (Signed)
I noted [name]'s orders to take a metolazone today with 40 mEq of potassium.   I contacted patient to see if he were able to make this change, however pt reports that he cannot read to pick the appropriate medication.  I will meet with pt tomorrow to make med change.   [name] EMT-P Community Paramedic  [phone]

## 2023-09-12 ENCOUNTER — Other Ambulatory Visit (HOSPITAL_COMMUNITY): Payer: Self-pay | Admitting: Emergency Medicine

## 2023-09-12 ENCOUNTER — Other Ambulatory Visit (INDEPENDENT_AMBULATORY_CARE_PROVIDER_SITE_OTHER): Payer: Medicare Other | Admitting: Pharmacist

## 2023-09-12 DIAGNOSIS — E1169 Type 2 diabetes mellitus with other specified complication: Secondary | ICD-10-CM

## 2023-09-12 MED ORDER — FREESTYLE LIBRE 3 PLUS SENSOR MISC: 3 refills | Status: DC

## 2023-09-12 NOTE — Telephone Encounter (Signed)
Noted! Thank you

## 2023-09-12 NOTE — Telephone Encounter (Signed)
Pt aware via para medicine

## 2023-09-12 NOTE — Progress Notes (Signed)
I added 2.5mg  of Metolazone and 40 mEq of potassium to pt's pill box for in the morning. Pt is aware of same and advised he would reach out if he had any further concerns.   Will follow up to make lab appointment for next Thursday.   [name] EMT-P Community Paramedic  [phone]

## 2023-09-12 NOTE — Progress Notes (Signed)
Care Coordination Call  Received request from provider/paramedicine regarding Libre 3 backorder. Will send for Libre 3 Plus to patient's [facility]. Notified paramedic.   Catie Eppie Gibson, PharmD, BCACP, CPP Clinical Pharmacist [facility] [phone]

## 2023-09-13 ENCOUNTER — Telehealth (HOSPITAL_COMMUNITY): Payer: Self-pay | Admitting: Licensed Clinical Social Worker

## 2023-09-13 NOTE — Telephone Encounter (Signed)
H&V Care Navigation CSW Progress Note  Clinical Social Worker called patient to discuss recent appt cancellations, non compliance with medications, and see if he had any current SDOH concerns leading to these concerns.  Patient denies current SDOH needs- reports some transportation issues but he drives himself despite not having active license at this time- will have back in a few months.  [facility] transport to canceled appt.  CSW inquired about pt medication compliance- patient reports he is compliant with help from paramedic.  Does not think he has any concerns at this time that need to be addressed.     SDOH Screenings   Food Insecurity: Food Insecurity Present (06/07/2023)  Housing: Low Risk  (05/24/2023)  Transportation Needs: No Transportation Needs (05/24/2023)  Utilities: Not At Risk (05/24/2023)  Alcohol Screen: Low Risk  (05/24/2023)  Depression (PHQ2-9): Low Risk  (05/24/2023)  Financial Resource Strain: Medium Risk (05/24/2023)  Physical Activity: Sufficiently Active (05/24/2023)  Social Connections: Moderately Isolated (05/24/2023)  Stress: No Stress Concern Present (05/24/2023)  Tobacco Use: Low Risk  (08/23/2023)    Burna Sis, LCSW Clinical Social Worker [facility] Desk#: [phone] Cell#: [phone]

## 2023-09-14 NOTE — Telephone Encounter (Signed)
Patient reminded that his Ozempic is here for pick up.

## 2023-09-18 ENCOUNTER — Telehealth (HOSPITAL_COMMUNITY): Payer: Self-pay | Admitting: Emergency Medicine

## 2023-09-18 NOTE — Telephone Encounter (Signed)
Patient picked up 4 boxes of ozmepic 2mg  from pt assistance supply that was shipped to [address] office.

## 2023-09-18 NOTE — Telephone Encounter (Signed)
[name] contacted me today to advise that he had one more day worth of pills to take, and requested if we could meet tomorrow.   Pt was reminded to pick up his ozempic. And pt advised he would.   Will follow up with pt tomorrow.  [name] EMT-P Community Paramedic  [phone]

## 2023-09-18 NOTE — Progress Notes (Signed)
Remote ICD transmission.   

## 2023-09-19 ENCOUNTER — Other Ambulatory Visit (HOSPITAL_COMMUNITY): Payer: Self-pay | Admitting: Emergency Medicine

## 2023-09-19 NOTE — Progress Notes (Signed)
Paramedicine Encounter    Patient ID: [name], male    DOB: Jul 12, 1961, 62 y.o.   MRN: [id number]   Complaints - none  Assessment - clear lung sounds, no edema noted.   Compliance with meds - Missed 1x evening dose  Pill box filled - for 1x week   Refills needed - none  Meds changes since last visit - none    Social changes - none    VISIT SUMMARY**  Met with [name] today in his home. Pt reported that he had been feeling well and had peed a lot after having the Metolazone, but reports he is feeling much better. Pt reported that he had also been trying to work on his diet and eat better. Pt was assessed as noted to find no acute abnormalities. Pt's pill box was filled x1 week. Pt was reminded that he had to get labs drawn tomorrow and pt advised that he would. Will follow up in 1x week.  BP (!) 148/90   Pulse 98   Resp 16   Wt 289 lb 3.2 oz (131.2 kg)   SpO2 98%   BMI 37.13 kg/m  Weight yesterday-DNW Last visit weight-294 lbs  [name] [employer]-P [facility]  [phone]     ACTION: Home visit completed     Patient Care Team: Ivonne Andrew, NP as PCP - General (Pulmonary Disease) Quintella Reichert, MD as PCP - Cardiology (Cardiology) Laurey Morale, MD as PCP - Advanced Heart Failure (Cardiology) Lanier Prude, MD as PCP - Electrophysiology (Cardiology) Burna Sis, LCSW as Social Worker (Licensed Clinical Social Worker) Tressia Danas, MD (Inactive) as Consulting Physician (Gastroenterology)  Patient Active Problem List   Diagnosis Date Noted   Acute right-sided low back pain with right-sided sciatica 05/14/2023   Multiple joint pain 12/22/2022   Obesity (BMI 35.0-39.9 without comorbidity) 07/18/2022   Cellulitis 06/22/2022   Cellulitis, leg 06/20/2022   Presence of Watchman left atrial appendage closure device 10/28/2021   Atrial fibrillation (HCC) 10/27/2021   Melena    Chronic diastolic CHF (congestive heart  failure) (HCC)    GI bleed 06/29/2021   Severe anemia 06/29/2021   Adenomatous polyp of ascending colon    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   Acute blood loss anemia    Angiodysplasia of stomach    Chronic anticoagulation    CHF exacerbation (HCC) 06/15/2020   AKI (acute kidney injury) (HCC) 06/15/2020   CKD (chronic kidney disease), stage III (HCC) 06/15/2020   Anemia due to chronic blood loss    Gastric AVM    Angiodysplasia of duodenum with hemorrhage    Chronic systolic CHF (congestive heart failure) (HCC) 04/05/2020   Anemia 04/05/2020   PAF (paroxysmal atrial fibrillation) (HCC) 04/05/2020   OSA (obstructive sleep apnea) 04/05/2020   Syncope and collapse 04/05/2020   Type 2 diabetes mellitus with hyperglycemia, with long-term current use of insulin (HCC) 12/03/2019   Diabetic polyneuropathy associated with type 2 diabetes mellitus (HCC) 12/03/2019   Erectile dysfunction 12/03/2019   Paranoid schizophrenia, chronic condition (HCC) 01/26/2015   Severe recurrent major depressive disorder with psychotic features (HCC) 01/26/2015   GAD (generalized anxiety disorder) 01/26/2015   OCD (obsessive compulsive disorder) 01/26/2015   Panic disorder without agoraphobia 01/26/2015   Insomnia 01/26/2015    Current Outpatient Medications:    acetaminophen (TYLENOL) 500 MG tablet, Take 2 tablets (1,000 mg total) by mouth 2 (two) times daily as needed. (Patient taking differently: Take 1,000 mg  by mouth 2 (two) times daily as needed. Takes 1000mg  morning and night.), Disp: 180 tablet, Rfl: 3   aspirin EC 81 MG tablet, Take 1 tablet (81 mg total) by mouth daily. Swallow whole., Disp: 90 tablet, Rfl: 3   atorvastatin (LIPITOR) 40 MG tablet, Take 1 tablet (40 mg total) by mouth daily., Disp: 90 tablet, Rfl: 3   Continuous Glucose Receiver (FREESTYLE LIBRE 3 READER) DEVI, 1 each by Does not apply route daily. Use to check glucose continuously, Disp: 1 each, Rfl: 1    Continuous Glucose Sensor (FREESTYLE LIBRE 3 PLUS SENSOR) MISC, Change sensor every 15 days., Disp: 6 each, Rfl: 3   DULoxetine (CYMBALTA) 30 MG capsule, Take 1 capsule by mouth once daily, Disp: 30 capsule, Rfl: 2   empagliflozin (JARDIANCE) 10 MG TABS tablet, TAKE 1 TABLET BY MOUTH ONCE DAILY ., Disp: 90 tablet, Rfl: 3   escitalopram (LEXAPRO) 10 MG tablet, Take 10 mg by mouth daily. Prescribed by Dr. Elvera Lennox. Dimkpa, Disp: , Rfl:    ferrous sulfate 325 (65 FE) MG tablet, Take 1 tablet (325 mg total) by mouth every other day., Disp: 30 tablet, Rfl: 1   furosemide (LASIX) 40 MG tablet, Take 1.5 tablets (60 mg total) by mouth 2 (two) times daily., Disp: 200 tablet, Rfl: 5   gabapentin (NEURONTIN) 800 MG tablet, Take 1 tablet (800 mg total) by mouth 3 (three) times daily., Disp: 90 tablet, Rfl: 5   HUMALOG MIX 75/25 KWIKPEN (75-25) 100 UNIT/ML KwikPen, DIAL AND INJECT 30 UNITS UNDER THE SKIN TWICE DAILY, Disp: 75 mL, Rfl: 0   metFORMIN (GLUCOPHAGE-XR) 500 MG 24 hr tablet, Take 1 tablet (500 mg total) by mouth 2 (two) times daily with a meal., Disp: 180 tablet, Rfl: 1   metoprolol succinate (TOPROL-XL) 50 MG 24 hr tablet, Take 1 tablet (50 mg total) by mouth daily., Disp: 180 tablet, Rfl: 3   Multiple Vitamins-Minerals (CENTRUM SILVER 50+MEN) TABS, Take 1 tablet by mouth daily., Disp: , Rfl:    Omega-3 Fatty Acids (FISH OIL) 1000 MG CAPS, Take 1,000 mg by mouth at bedtime., Disp: , Rfl:    pantoprazole (PROTONIX) 40 MG tablet, Take 1 tablet by mouth once daily, Disp: 90 tablet, Rfl: 0   potassium chloride SA (KLOR-CON M) 20 MEQ tablet, Take 2 tablets (40 mEq total) by mouth daily., Disp: 60 tablet, Rfl: 6   RELION PEN NEEDLES 31G X 6 MM MISC, INJECT 1 PEN INTO THE SKIN 2 TIMES DAILY, Disp: 100 each, Rfl: 0   sacubitril-valsartan (ENTRESTO) 24-26 MG, Take 1 tablet by mouth 2 (two) times daily. NEEDS FOLLOW UP APPOINTMENT FOR MORE REFILLS, Disp: 180 tablet, Rfl: 0   Semaglutide, 2 MG/DOSE, (OZEMPIC, 2  MG/DOSE,) 8 MG/3ML SOPN, Inject 2 mg into the skin once a week. Monday, Disp: , Rfl:    sildenafil (VIAGRA) 100 MG tablet, TAKE 1/2 TO 1 (ONE-HALF TO ONE) TABLET BY MOUTH ONCE DAILY AS NEEDED FOR ERECTILE DYSFUNCTION, Disp: 25 tablet, Rfl: 0   spironolactone (ALDACTONE) 25 MG tablet, Take 1 tablet (25 mg total) by mouth daily., Disp: 90 tablet, Rfl: 3   tadalafil (CIALIS) 20 MG tablet, Take 20 mg by mouth daily as needed., Disp: , Rfl:    Vitamin D, Ergocalciferol, (DRISDOL) 1.25 MG (50000 UNIT) CAPS capsule, Take 1 capsule (50,000 Units total) by mouth once a week., Disp: 5 capsule, Rfl: 2   albuterol (VENTOLIN HFA) 108 (90 Base) MCG/ACT inhaler, Inhale 2 puffs into the lungs every 6 (six)  hours as needed for wheezing or shortness of breath., Disp: 18 g, Rfl: 1   blood glucose meter kit and supplies KIT, Dispense based on patient and insurance preference. Use up to four times daily as directed. (FOR ICD-9 250.00, 250.01). (Patient not taking: Reported on 09/19/2023), Disp: 1 each, Rfl: 0   colchicine 0.6 MG tablet, Daily for 7 days then daily PRN for gout flare (Patient not taking: Reported on 09/19/2023), Disp: 30 tablet, Rfl: 0   Continuous Glucose Sensor (FREESTYLE LIBRE 3 SENSOR) MISC, Place 1 sensor on the skin every 14 days. Use to check glucose continuously (Patient not taking: Reported on 09/19/2023), Disp: 6 each, Rfl: 2   diclofenac Sodium (VOLTAREN) 1 % GEL, Apply 2 g topically daily as needed for pain. (Patient not taking: Reported on 08/23/2023), Disp: , Rfl:    metolazone (ZAROXOLYN) 2.5 MG tablet, Take 1 tablet (2.5 mg total) by mouth as directed. (Patient not taking: Reported on 01/29/2023), Disp: 5 tablet, Rfl: 2   mupirocin ointment (BACTROBAN) 2 %, Apply 1 Application topically 2 (two) times daily. (Patient not taking: Reported on 08/30/2023), Disp: 22 g, Rfl: 0   Oxycodone HCl 10 MG TABS, Take 10 mg by mouth 3 (three) times daily. (Patient not taking: Reported on 07/25/2023), Disp: , Rfl:     QUEtiapine (SEROQUEL) 300 MG tablet, Take 1.5 tablets (450 mg total) by mouth at bedtime., Disp: 135 tablet, Rfl: 1   zinc gluconate 50 MG tablet, Take 50 mg by mouth daily. (Patient not taking: Reported on 09/11/2023), Disp: , Rfl:  No Known Allergies   Social History   Socioeconomic History   Marital status: Legally Separated    Spouse name: Not on file   Number of children: Not on file   Years of education: Not on file   Highest education level: Not on file  Occupational History   Not on file  Tobacco Use   Smoking status: Never    Passive exposure: Never   Smokeless tobacco: Never  Vaping Use   Vaping status: Never Used  Substance and Sexual Activity   Alcohol use: Not Currently    Alcohol/week: 2.0 standard drinks of alcohol    Types: 2 Cans of beer per week   Drug use: No   Sexual activity: Yes    Birth control/protection: None  Other Topics Concern   Not on file  Social History Narrative   Not on file   Social Determinants of Health   Financial Resource Strain: Medium Risk (05/24/2023)   Overall Financial Resource Strain (CARDIA)    Difficulty of Paying Living Expenses: Somewhat hard  Food Insecurity: Food Insecurity Present (06/07/2023)   Hunger Vital Sign    Worried About Running Out of Food in the Last Year: Sometimes true    Ran Out of Food in the Last Year: Sometimes true  Transportation Needs: No Transportation Needs (05/24/2023)   PRAPARE - Administrator, Civil Service (Medical): No    Lack of Transportation (Non-Medical): No  Physical Activity: Sufficiently Active (05/24/2023)   Exercise Vital Sign    Days of Exercise per Week: 7 days    Minutes of Exercise per Session: 30 min  Stress: No Stress Concern Present (05/24/2023)   [employer] of Occupational Health - Occupational Stress Questionnaire    Feeling of Stress : Not at all  Social Connections: Moderately Isolated (05/24/2023)   Social Connection and Isolation Panel [NHANES]     Frequency of Communication with Friends and Family:  More than three times a week    Frequency of Social Gatherings with Friends and Family: More than three times a week    Attends Religious Services: Never    Database administrator or Organizations: No    Attends Banker Meetings: Never    Marital Status: Living with partner  Intimate Partner Violence: Not At Risk (05/24/2023)   Humiliation, Afraid, Rape, and Kick questionnaire    Fear of Current or Ex-Partner: No    Emotionally Abused: No    Physically Abused: No    Sexually Abused: No    Physical Exam      Future Appointments  Date Time Provider Department Center  09/28/2023  3:30 PM [facility] PA/NP [facility] None  10/10/2023  1:55 PM Sheilah Pigeon, PA-C [facility]  11/23/2023  1:40 PM Ivonne Andrew, NP [facility] None  12/04/2023  7:40 AM [facility] DEVICE REMOTES [facility]  03/05/2024  7:40 AM [facility] DEVICE REMOTES [facility]  05/29/2024  1:00 PM [facility]-ANNUAL WELLNESS VISIT [facility] None  06/05/2024  7:40 AM [facility] DEVICE REMOTES [facility]  09/05/2024  7:40 AM [facility] DEVICE REMOTES [facility]              Mervyn Skeeters

## 2023-09-26 ENCOUNTER — Other Ambulatory Visit (HOSPITAL_COMMUNITY): Payer: Self-pay | Admitting: Emergency Medicine

## 2023-09-26 ENCOUNTER — Encounter: Payer: Self-pay | Admitting: Pharmacist

## 2023-09-26 NOTE — Progress Notes (Signed)
Pharmacy Quality Measure Review  This patient is appearing on a report for being at risk of failing the adherence measure for cholesterol (statin) medications this calendar year.   Medication: atorvastatin 40 mg daily Last fill date: 06/20/23 for 90 day supply  Collaborated with [facility], who has been assisting with pill box fills. They confirmed patient has atorvastatin. Previous adherence concerns amy have led to a significant supply on hand.   No further action needed at this time.   Catie Eppie Gibson, PharmD, BCACP, CPP Clinical Pharmacist [facility] [phone]

## 2023-09-26 NOTE — Progress Notes (Signed)
ADVANCED HEART FAILURE CLINIC NOTE  PCP: Ivonne Andrew, NP Cardiology: Dr. Mayford Knife HF Cardiology: Dr. Shirlee Latch  62 y.o. with history of chronic systolic CHF, CAD, type 2 diabetes, paroxysmal atrial fibrillation, and schizophrenia was referred by Dr. Mayford Knife for evaluation of CHF.  Patient had OM2 PCI in 2012.  In 11/20, he was admitted with CHF. Echo showed EF 25-30% with diffuse hypokinesis.  LHC was done, showing occluded OM2 at prior stent, 90% D1 stenosis, and extensive diffuse RCA disease.  No intervention.  He was thought to be in paroxysmal atrial fibrillation during this appointment and apixaban was started.   He does not smoke, rarely drinks, and does not use drugs.  His mother had "heart problems."    He can write his name only. He is only able to read a few words. Thinks he completed the 7th grade.    Echo in 5/21 showed EF 30% with diffuse hypokinesis, mild LVH, PASP 38, mildly decreased RV systolic function, IVC dilated. Biotronik ICD placed.   He was hospitalized in 7/21 with upper GI bleeding and CHF exacerbation.  EGD showed duodenal AVMs, treated with APC. He was diuresed.   Clinic visit 5/22 he was volume overloaded, weight up 19 lbs, and diuretics increased and Farxiga started.   He was admitted to [facility] a couple weeks later (04/13/21) for a/c CHF. He was diuresed with IV lasix. Hospitalization complicated by GIB & AKI. Enteroscopy performed on 5/20 which showed 2 small angioectasias with no bleeding in the third portion of the duodenum. Colonoscopy on 5/21, showed multiple polyps which were biopsied. No source of bleeding found. He had pill endoscopy 5/22 which showed non bleeding AVM. Pt had repeat enteroscopy which showed non bleeding jejunal ulcer. Eliquis held during admission but restarted at discharge. Home hydralazine, spiro, Entresto, and Imdur stopped in setting of GIB; carvedilol dose decreased at discharge.  He returned 6/22 for post hospitalization HF follow up. Up 10  lbs per paramedicine. NYHA II symptoms. Entresto restarted and lasix increased.  Admitted 7/22 for chest pain, found to have hgb of 4.7. He received 2 U PRBCs, Eliquis was held and GI & AHF were consulted for further management. Farxiga and spiro held with increase in kidney function. Repeat echo on 06/30/21 EF 50-55%, mildly dilated LV, mild LVH, RV okay, dilated IVC with estimated RA pressure of 8. He underwent EGD on 07/01/21 showing three non-bleeding angioectasias in the duodenum. Treated with argon plasma coagulation (APC). GDMT therapy added back as SCr improved. Eliquis restarted and arrangement made for referral for Watchman device consideration. Day of discharge weight 324 lbs, SCr 1.29 and hgb 8.3.   S/p Watchman 10/27/21.   Device showed ATP (07/27/22) >>inappropriate shock for rapid AF/AFL, did convert to NSR. His Coreg was changed to Toprol at EP follow up.    Today he returns for HF follow up. Overall feeling fine. Walking for exercise now, walks uphill without SOB. Denies palpitations, CP, dizziness, edema, or PND/Orthopnea. Appetite ok. No fever or chills. Weight at home 284 pounds. Taking all medications. Using CPAP.   ECG (personally reviewed): NSR with frequent PVCs  Labs (1/21): LDL 66, HDL 42, hgb 11.3, K 4.7, creatinine 1.32 Labs (4/21): K 5, creatinine 1.37 Labs (7/21): K 4.1, creatinine 1.26, hgb 9.3 Labs (8/21): LDL 51, K 3.8, creatinine 1.3 Labs (11/21): K 4, creatinine 1.42, hgb 13.1, hgbA1c 11.7 Labs (2/22): HgbA1c 7.7 Labs (3/22): K 4.4, creatinine 1.27, pro-BNP 249. Labs (5/22): K 4.4, creatinine 1.22 Labs (  8/22): K 4.1, creatinine 1.00, hgb 8.3 Labs (12/22): K 4.7, creatinine 1.13 Labs (10/23): K 4.4, creatinine 1.12 Labs (1/24): K 3.8, creatinine 1.23, LFTs normal Labs (9/24): K 5.4, creatinine 1.19  PMH:  1. Atrial fibrillation: Paroxysmal - s/p LAAO device (12/22) 2. Type 2 diabetes 3. HTN 4. Hyperlipidemia 5. Schizophrenia 6. CAD: PCI OM2 in 2012.  -  LHC (11/20): 90% D1 stenosis, totally occluded OM2 at stent, serial 85%/70%/60% RCA stenoses.  7. 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  Biotronik ICD.  - Echo (11/20): EF 25-30%, global hypokinesis.  - Echo (5/21): EF 30% with diffuse hypokinesis, mild LVH, PASP 38, mildly decreased RV systolic function, IVC dilated. - TEE (1/23): EF 50-55%, global LV HK, normal RV 8. Upper GI bleeding: 7/21, duodenal AVMs treated with APC.  9. OSA: Does not use CPAP regularly.   Social History   Socioeconomic History   Marital status: Legally Separated    Spouse name: Not on file   Number of children: Not on file   Years of education: Not on file   Highest education level: Not on file  Occupational History   Not on file  Tobacco Use   Smoking status: Never    Passive exposure: Never   Smokeless tobacco: Never  Vaping Use   Vaping status: Never Used  Substance and Sexual Activity   Alcohol use: Not Currently    Alcohol/week: 2.0 standard drinks of alcohol    Types: 2 Cans of beer per week   Drug use: No   Sexual activity: Yes    Birth control/protection: None  Other Topics Concern   Not on file  Social History Narrative   Not on file   Social Determinants of Health   Financial Resource Strain: Medium Risk (05/24/2023)   Overall Financial Resource Strain (CARDIA)    Difficulty of Paying Living Expenses: Somewhat hard  Food Insecurity: Food Insecurity Present (06/07/2023)   Hunger Vital Sign    Worried About Running Out of Food in the Last Year: Sometimes true    Ran Out of Food in the Last Year: Sometimes true  Transportation Needs: No Transportation Needs (05/24/2023)   PRAPARE - Administrator, Civil Service (Medical): No    Lack of Transportation (Non-Medical): No  Physical Activity: Sufficiently Active (05/24/2023)   Exercise Vital Sign    Days of Exercise per Week: 7 days    Minutes of Exercise per Session: 30 min  Stress: No Stress  Concern Present (05/24/2023)   [employer] of Occupational Health - Occupational Stress Questionnaire    Feeling of Stress : Not at all  Social Connections: Moderately Isolated (05/24/2023)   Social Connection and Isolation Panel [NHANES]    Frequency of Communication with Friends and Family: More than three times a week    Frequency of Social Gatherings with Friends and Family: More than three times a week    Attends Religious Services: Never    Database administrator or Organizations: No    Attends Banker Meetings: Never    Marital Status: Living with partner  Intimate Partner Violence: Not At Risk (05/24/2023)   Humiliation, Afraid, Rape, and Kick questionnaire    Fear of Current or Ex-Partner: No    Emotionally Abused: No    Physically Abused: No    Sexually Abused: No   Family History  Problem Relation Age of Onset   Heart failure Mother    Mental illness Sister    Mental  illness Sister    ROS: All systems reviewed and negative except as per HPI.  Current Meds  Medication Sig   acetaminophen (TYLENOL) 500 MG tablet Take 2 tablets (1,000 mg total) by mouth 2 (two) times daily as needed. (Patient taking differently: Take 1,000 mg by mouth 2 (two) times daily as needed. Takes 1000mg  morning and night.)   albuterol (VENTOLIN HFA) 108 (90 Base) MCG/ACT inhaler Inhale 2 puffs into the lungs every 6 (six) hours as needed for wheezing or shortness of breath.   atorvastatin (LIPITOR) 40 MG tablet Take 1 tablet (40 mg total) by mouth daily.   blood glucose meter kit and supplies KIT Dispense based on patient and insurance preference. Use up to four times daily as directed. (FOR ICD-9 250.00, 250.01).   colchicine 0.6 MG tablet Daily for 7 days then daily PRN for gout flare   Continuous Glucose Receiver (FREESTYLE LIBRE 3 READER) DEVI 1 each by Does not apply route daily. Use to check glucose continuously   Continuous Glucose Sensor (FREESTYLE LIBRE 3 SENSOR) MISC Place 1  sensor on the skin every 14 days. Use to check glucose continuously   diclofenac Sodium (VOLTAREN) 1 % GEL Apply 2 g topically daily as needed for pain.   DULoxetine (CYMBALTA) 30 MG capsule Take 1 capsule by mouth once daily   empagliflozin (JARDIANCE) 10 MG TABS tablet TAKE 1 TABLET BY MOUTH ONCE DAILY .   escitalopram (LEXAPRO) 10 MG tablet Take 10 mg by mouth daily. Prescribed by Dr. Elvera Lennox. Dimkpa   ferrous sulfate 325 (65 FE) MG tablet Take 1 tablet (325 mg total) by mouth every other day.   furosemide (LASIX) 40 MG tablet Take 1.5 tablets (60 mg total) by mouth 2 (two) times daily.   gabapentin (NEURONTIN) 800 MG tablet Take 1 tablet (800 mg total) by mouth 3 (three) times daily.   HUMALOG MIX 75/25 KWIKPEN (75-25) 100 UNIT/ML KwikPen DIAL AND INJECT 30 UNITS UNDER THE SKIN TWICE DAILY   metFORMIN (GLUCOPHAGE-XR) 500 MG 24 hr tablet Take 1 tablet (500 mg total) by mouth 2 (two) times daily with a meal.   metoprolol succinate (TOPROL-XL) 50 MG 24 hr tablet Take 1 tablet (50 mg total) by mouth daily.   Multiple Vitamins-Minerals (CENTRUM SILVER 50+MEN) TABS Take 1 tablet by mouth daily.   Omega-3 Fatty Acids (FISH OIL) 1000 MG CAPS Take 1,000 mg by mouth at bedtime.   pantoprazole (PROTONIX) 40 MG tablet Take 1 tablet by mouth once daily   potassium chloride SA (KLOR-CON M) 20 MEQ tablet Take 2 tablets (40 mEq total) by mouth daily.   QUEtiapine (SEROQUEL) 300 MG tablet Take 1.5 tablets (450 mg total) by mouth at bedtime.   RELION PEN NEEDLES 31G X 6 MM MISC INJECT 1 PEN INTO THE SKIN 2 TIMES DAILY   sacubitril-valsartan (ENTRESTO) 24-26 MG Take 1 tablet by mouth 2 (two) times daily. NEEDS FOLLOW UP APPOINTMENT FOR MORE REFILLS   Semaglutide, 2 MG/DOSE, (OZEMPIC, 2 MG/DOSE,) 8 MG/3ML SOPN Inject 2 mg into the skin once a week. Monday   sildenafil (VIAGRA) 100 MG tablet TAKE 1/2 TO 1 (ONE-HALF TO ONE) TABLET BY MOUTH ONCE DAILY AS NEEDED FOR ERECTILE DYSFUNCTION   spironolactone (ALDACTONE) 25 MG  tablet Take 1 tablet (25 mg total) by mouth daily.   tadalafil (CIALIS) 20 MG tablet Take 20 mg by mouth daily as needed.   Vitamin D, Ergocalciferol, (DRISDOL) 1.25 MG (50000 UNIT) CAPS capsule Take 1 capsule (50,000 Units total)  by mouth once a week.   zinc gluconate 50 MG tablet Take 50 mg by mouth daily.   Current Outpatient Medications on File Prior to Encounter  Medication Sig Dispense Refill   acetaminophen (TYLENOL) 500 MG tablet Take 2 tablets (1,000 mg total) by mouth 2 (two) times daily as needed. (Patient taking differently: Take 1,000 mg by mouth 2 (two) times daily as needed. Takes 1000mg  morning and night.) 180 tablet 3   albuterol (VENTOLIN HFA) 108 (90 Base) MCG/ACT inhaler Inhale 2 puffs into the lungs every 6 (six) hours as needed for wheezing or shortness of breath. 18 g 1   atorvastatin (LIPITOR) 40 MG tablet Take 1 tablet (40 mg total) by mouth daily. 90 tablet 3   blood glucose meter kit and supplies KIT Dispense based on patient and insurance preference. Use up to four times daily as directed. (FOR ICD-9 250.00, 250.01). 1 each 0   colchicine 0.6 MG tablet Daily for 7 days then daily PRN for gout flare 30 tablet 0   Continuous Glucose Receiver (FREESTYLE LIBRE 3 READER) DEVI 1 each by Does not apply route daily. Use to check glucose continuously 1 each 1   Continuous Glucose Sensor (FREESTYLE LIBRE 3 SENSOR) MISC Place 1 sensor on the skin every 14 days. Use to check glucose continuously 6 each 2   diclofenac Sodium (VOLTAREN) 1 % GEL Apply 2 g topically daily as needed for pain.     DULoxetine (CYMBALTA) 30 MG capsule Take 1 capsule by mouth once daily 30 capsule 2   empagliflozin (JARDIANCE) 10 MG TABS tablet TAKE 1 TABLET BY MOUTH ONCE DAILY . 90 tablet 3   escitalopram (LEXAPRO) 10 MG tablet Take 10 mg by mouth daily. Prescribed by Dr. Elvera Lennox. Dimkpa     ferrous sulfate 325 (65 FE) MG tablet Take 1 tablet (325 mg total) by mouth every other day. 30 tablet 1   furosemide  (LASIX) 40 MG tablet Take 1.5 tablets (60 mg total) by mouth 2 (two) times daily. 200 tablet 5   gabapentin (NEURONTIN) 800 MG tablet Take 1 tablet (800 mg total) by mouth 3 (three) times daily. 90 tablet 5   HUMALOG MIX 75/25 KWIKPEN (75-25) 100 UNIT/ML KwikPen DIAL AND INJECT 30 UNITS UNDER THE SKIN TWICE DAILY 75 mL 0   metFORMIN (GLUCOPHAGE-XR) 500 MG 24 hr tablet Take 1 tablet (500 mg total) by mouth 2 (two) times daily with a meal. 180 tablet 1   metoprolol succinate (TOPROL-XL) 50 MG 24 hr tablet Take 1 tablet (50 mg total) by mouth daily. 180 tablet 3   Multiple Vitamins-Minerals (CENTRUM SILVER 50+MEN) TABS Take 1 tablet by mouth daily.     Omega-3 Fatty Acids (FISH OIL) 1000 MG CAPS Take 1,000 mg by mouth at bedtime.     pantoprazole (PROTONIX) 40 MG tablet Take 1 tablet by mouth once daily 90 tablet 0   potassium chloride SA (KLOR-CON M) 20 MEQ tablet Take 2 tablets (40 mEq total) by mouth daily. 60 tablet 6   QUEtiapine (SEROQUEL) 300 MG tablet Take 1.5 tablets (450 mg total) by mouth at bedtime. 135 tablet 1   RELION PEN NEEDLES 31G X 6 MM MISC INJECT 1 PEN INTO THE SKIN 2 TIMES DAILY 100 each 0   sacubitril-valsartan (ENTRESTO) 24-26 MG Take 1 tablet by mouth 2 (two) times daily. NEEDS FOLLOW UP APPOINTMENT FOR MORE REFILLS 180 tablet 0   Semaglutide, 2 MG/DOSE, (OZEMPIC, 2 MG/DOSE,) 8 MG/3ML SOPN Inject 2 mg into  the skin once a week. Monday     sildenafil (VIAGRA) 100 MG tablet TAKE 1/2 TO 1 (ONE-HALF TO ONE) TABLET BY MOUTH ONCE DAILY AS NEEDED FOR ERECTILE DYSFUNCTION 25 tablet 0   spironolactone (ALDACTONE) 25 MG tablet Take 1 tablet (25 mg total) by mouth daily. 90 tablet 3   tadalafil (CIALIS) 20 MG tablet Take 20 mg by mouth daily as needed.     Vitamin D, Ergocalciferol, (DRISDOL) 1.25 MG (50000 UNIT) CAPS capsule Take 1 capsule (50,000 Units total) by mouth once a week. 5 capsule 2   zinc gluconate 50 MG tablet Take 50 mg by mouth daily.     No current facility-administered  medications on file prior to encounter.   BP 106/74   Pulse 64   Wt 129.2 kg (284 lb 12.8 oz)   SpO2 96%   BMI 36.57 kg/m   Wt Readings from Last 3 Encounters:  09/28/23 129.2 kg (284 lb 12.8 oz)  09/26/23 127.7 kg (281 lb 9.6 oz)  09/19/23 131.2 kg (289 lb 3.2 oz)   Physical Exam General:  NAD. No resp difficulty, walked into clinic HEENT: Normal Neck: Supple. No JVD. Carotids 2+ bilat; no bruits. No lymphadenopathy or thryomegaly appreciated. Cor: PMI nondisplaced. Regular rate & rhythm. No rubs, gallops or murmurs. Lungs: Clear Abdomen: Soft, nontender, nondistended. No hepatosplenomegaly. No bruits or masses. Good bowel sounds. Extremities: No cyanosis, clubbing, rash, edema Neuro: Alert & oriented x 3, cranial nerves grossly intact. Moves all 4 extremities w/o difficulty. Affect pleasant.  Assessment/Plan: 1. Chronic systolic CHF with recovery in LV function: Echo in 11/20 with EF 25-30%, echo 5/21 with EF 30% with mildly decreased RV systolic function. Biotronik ICD.  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 Echo 8/22 with improvement in LVEF to 50-55%. No longer drinking ETOH. TEE (1/23) EF 50-55%. NYHA class II. He is not volume overloaded by exam today.  - Continue Lasix 60 mg bid. BMET and BNP - Continue Toprol XL 50 mg daily.  - Continue Entresto 24/26 mg bid.  - Continue spironolactone 25 mg daily.  - Continue Jardiance 10 mg daily.  - I will arrange for repeat echo.  2. 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 No further bleeding issues now that he is off anticoagulation s/p Watchman.  - Recent CBC reviewed and stable - Continue PPI.  3. Paroxysmal atrial fibrillation: NSR today. CHADS2-VASc score = 4 (CAD, CHF, HTN, DM).  Off Eliquis with frequent GIB.  Now s/p Watchman 12/22. - Continue ASA 81 with Watchman.  4. CAD: Last cath 09/2021 with occluded OM2 stent, 90% small OM1, diffuse disease RCA treated  medically.  No chest pain.  - On statin + ASA.  5. HTN: BP stable - GDMT as above 6. OSA: wearing CPAP. 7. CKD 3a: Continue SGLT2i. BMET today. 8. PVCs: Frequent on ECG today, asymptomatic. Compliant with CPAP. - Check labs and TSH - Place 2 week Zio to quantify burden.  9. Obesity: Body mass index is 36.57 kg/m.  Weight trending down.  - He is now on semaglutide.  Follow up in 4 months with Dr. Shirlee Latch. Update echo soon.  Anderson [facility] FNP-BC 09/28/23

## 2023-09-26 NOTE — Progress Notes (Signed)
Paramedicine Encounter    Patient ID: [name], male    DOB: [birth year], 62 y.o.   MRN: [id number]   Complaints - 3x days nausea, leg cramps, headache, vomiting.   Assessment - no edema noted, lung sounds clear. No obvious appearance of distress.   Compliance with meds - no meds today, but compliant up until today  Pill box filled -for 1x week.   Refills needed - none  Meds changes since last visit - none     Social changes - none   VISIT SUMMARY**  Met with [name] in his home today. Pt reported that he was really "cleaned out by that medication". Pt reported slight nausea and cramps for the last 3x days. Pt advised that he had been non-compliant with his repeat labs and had asked if he could go ahead and wait until his appointment on Friday. I spoke with the HF Triage and they advised that he could have labs drawn at his appointment on Friday. Pt was assessed to find him hypertensive, but pt had not been compliant with his medications yet today. No other acute abnormalities noted. Pt's pill box was filled for 1x week. Upcoming appointments reviewed. Will follow up in 1x week.   BP (!) 156/100 Comment: no morning meds yet  Pulse 74   Resp 16   Wt 281 lb 9.6 oz (127.7 kg)   SpO2 96%   BMI 36.16 kg/m  Weight yesterday-DNW Last visit weight-289 lbs  [name] EMT-P Community Par[facility]ic  [phone]     ACTION: Home visit completed     Patient Care Team: Ivonne Andrew, NP as PCP - General (Pulmonary Disease) Quintella Reichert, MD as PCP - Cardiology (Cardiology) Laurey Morale, MD as PCP - Advanced Heart Failure (Cardiology) Lanier Prude, MD as PCP - Electrophysiology (Cardiology) Burna Sis, LCSW as Social Worker (Licensed Clinical Social Worker) Tressia Danas, MD (Inactive) as Consulting Physician (Gastroenterology)  Patient Active Problem List   Diagnosis Date Noted   Acute right-sided low back pain with right-sided sciatica  05/14/2023   Multiple joint pain 12/22/2022   Obesity (BMI 35.0-39.9 without comorbidity) 07/18/2022   Cellulitis 06/22/2022   Cellulitis, leg 06/20/2022   Presence of Watchman left atrial appendage closure device 10/28/2021   Atrial fibrillation (HCC) 10/27/2021   Melena    Chronic diastolic CHF (congestive heart failure) (HCC)    GI bleed 06/29/2021   Severe anemia 06/29/2021   Adenomatous polyp of ascending colon    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   Acute blood loss anemia    Angiodysplasia of stomach    Chronic anticoagulation    CHF exacerbation (HCC) 06/15/2020   AKI (acute kidney injury) (HCC) 06/15/2020   CKD (chronic kidney disease), stage III (HCC) 06/15/2020   Anemia due to chronic blood loss    Gastric AVM    Angiodysplasia of duodenum with hemorrhage    Chronic systolic CHF (congestive heart failure) (HCC) 04/05/2020   Anemia 04/05/2020   PAF (paroxysmal atrial fibrillation) (HCC) 04/05/2020   OSA (obstructive sleep apnea) 04/05/2020   Syncope and collapse 04/05/2020   Type 2 diabetes mellitus with hyperglycemia, with long-term current use of insulin (HCC) 12/03/2019   Diabetic polyneuropathy associated with type 2 diabetes mellitus (HCC) 12/03/2019   Erectile dysfunction 12/03/2019   Paranoid schizophrenia, chronic condition (HCC) 01/26/2015   Severe recurrent major depressive disorder with psychotic features (HCC) 01/26/2015   GAD (generalized anxiety disorder) 01/26/2015   OCD (obsessive compulsive  disorder) 01/26/2015   Panic disorder without agoraphobia 01/26/2015   Insomnia 01/26/2015    Current Outpatient Medications:    acetaminophen (TYLENOL) 500 MG tablet, Take 2 tablets (1,000 mg total) by mouth 2 (two) times daily as needed. (Patient taking differently: Take 1,000 mg by mouth 2 (two) times daily as needed. Takes 1000mg  morning and night.), Disp: 180 tablet, Rfl: 3   aspirin EC 81 MG tablet, Take 1 tablet (81 mg total)  by mouth daily. Swallow whole., Disp: 90 tablet, Rfl: 3   atorvastatin (LIPITOR) 40 MG tablet, Take 1 tablet (40 mg total) by mouth daily., Disp: 90 tablet, Rfl: 3   Continuous Glucose Receiver (FREESTYLE LIBRE 3 READER) DEVI, 1 each by Does not apply route daily. Use to check glucose continuously, Disp: 1 each, Rfl: 1   Continuous Glucose Sensor (FREESTYLE LIBRE 3 PLUS SENSOR) MISC, Change sensor every 15 days., Disp: 6 each, Rfl: 3   DULoxetine (CYMBALTA) 30 MG capsule, Take 1 capsule by mouth once daily, Disp: 30 capsule, Rfl: 2   empagliflozin (JARDIANCE) 10 MG TABS tablet, TAKE 1 TABLET BY MOUTH ONCE DAILY ., Disp: 90 tablet, Rfl: 3   escitalopram (LEXAPRO) 10 MG tablet, Take 10 mg by mouth daily. Prescribed by Dr. Elvera Lennox. Dimkpa, Disp: , Rfl:    ferrous sulfate 325 (65 FE) MG tablet, Take 1 tablet (325 mg total) by mouth every other day., Disp: 30 tablet, Rfl: 1   furosemide (LASIX) 40 MG tablet, Take 1.5 tablets (60 mg total) by mouth 2 (two) times daily., Disp: 200 tablet, Rfl: 5   gabapentin (NEURONTIN) 800 MG tablet, Take 1 tablet (800 mg total) by mouth 3 (three) times daily., Disp: 90 tablet, Rfl: 5   HUMALOG MIX 75/25 KWIKPEN (75-25) 100 UNIT/ML KwikPen, DIAL AND INJECT 30 UNITS UNDER THE SKIN TWICE DAILY, Disp: 75 mL, Rfl: 0   metFORMIN (GLUCOPHAGE-XR) 500 MG 24 hr tablet, Take 1 tablet (500 mg total) by mouth 2 (two) times daily with a meal., Disp: 180 tablet, Rfl: 1   metoprolol succinate (TOPROL-XL) 50 MG 24 hr tablet, Take 1 tablet (50 mg total) by mouth daily., Disp: 180 tablet, Rfl: 3   Multiple Vitamins-Minerals (CENTRUM SILVER 50+MEN) TABS, Take 1 tablet by mouth daily., Disp: , Rfl:    Omega-3 Fatty Acids (FISH OIL) 1000 MG CAPS, Take 1,000 mg by mouth at bedtime., Disp: , Rfl:    pantoprazole (PROTONIX) 40 MG tablet, Take 1 tablet by mouth once daily, Disp: 90 tablet, Rfl: 0   potassium chloride SA (KLOR-CON M) 20 MEQ tablet, Take 2 tablets (40 mEq total) by mouth daily., Disp: 60  tablet, Rfl: 6   RELION PEN NEEDLES 31G X 6 MM MISC, INJECT 1 PEN INTO THE SKIN 2 TIMES DAILY, Disp: 100 each, Rfl: 0   sacubitril-valsartan (ENTRESTO) 24-26 MG, Take 1 tablet by mouth 2 (two) times daily. NEEDS FOLLOW UP APPOINTMENT FOR MORE REFILLS, Disp: 180 tablet, Rfl: 0   Semaglutide, 2 MG/DOSE, (OZEMPIC, 2 MG/DOSE,) 8 MG/3ML SOPN, Inject 2 mg into the skin once a week. Monday, Disp: , Rfl:    sildenafil (VIAGRA) 100 MG tablet, TAKE 1/2 TO 1 (ONE-HALF TO ONE) TABLET BY MOUTH ONCE DAILY AS NEEDED FOR ERECTILE DYSFUNCTION, Disp: 25 tablet, Rfl: 0   spironolactone (ALDACTONE) 25 MG tablet, Take 1 tablet (25 mg total) by mouth daily., Disp: 90 tablet, Rfl: 3   tadalafil (CIALIS) 20 MG tablet, Take 20 mg by mouth daily as needed., Disp: , Rfl:  Vitamin D, Ergocalciferol, (DRISDOL) 1.25 MG (50000 UNIT) CAPS capsule, Take 1 capsule (50,000 Units total) by mouth once a week., Disp: 5 capsule, Rfl: 2   albuterol (VENTOLIN HFA) 108 (90 Base) MCG/ACT inhaler, Inhale 2 puffs into the lungs every 6 (six) hours as needed for wheezing or shortness of breath., Disp: 18 g, Rfl: 1   blood glucose meter kit and supplies KIT, Dispense based on patient and insurance preference. Use up to four times daily as directed. (FOR ICD-9 250.00, 250.01). (Patient not taking: Reported on 09/26/2023), Disp: 1 each, Rfl: 0   colchicine 0.6 MG tablet, Daily for 7 days then daily PRN for gout flare (Patient not taking: Reported on 09/19/2023), Disp: 30 tablet, Rfl: 0   Continuous Glucose Sensor (FREESTYLE LIBRE 3 SENSOR) MISC, Place 1 sensor on the skin every 14 days. Use to check glucose continuously (Patient not taking: Reported on 09/19/2023), Disp: 6 each, Rfl: 2   diclofenac Sodium (VOLTAREN) 1 % GEL, Apply 2 g topically daily as needed for pain. (Patient not taking: Reported on 08/23/2023), Disp: , Rfl:    metolazone (ZAROXOLYN) 2.5 MG tablet, Take 1 tablet (2.5 mg total) by mouth as directed. (Patient not taking: Reported on  01/29/2023), Disp: 5 tablet, Rfl: 2   mupirocin ointment (BACTROBAN) 2 %, Apply 1 Application topically 2 (two) times daily. (Patient not taking: Reported on 08/30/2023), Disp: 22 g, Rfl: 0   Oxycodone HCl 10 MG TABS, Take 10 mg by mouth 3 (three) times daily. (Patient not taking: Reported on 07/25/2023), Disp: , Rfl:    QUEtiapine (SEROQUEL) 300 MG tablet, Take 1.5 tablets (450 mg total) by mouth at bedtime., Disp: 135 tablet, Rfl: 1   zinc gluconate 50 MG tablet, Take 50 mg by mouth daily. (Patient not taking: Reported on 09/11/2023), Disp: , Rfl:  No Known Allergies   Social History   Socioeconomic History   Marital status: Legally Separated    Spouse name: Not on file   Number of children: Not on file   Years of education: Not on file   Highest education level: Not on file  Occupational History   Not on file  Tobacco Use   Smoking status: Never    Passive exposure: Never   Smokeless tobacco: Never  Vaping Use   Vaping status: Never Used  Substance and Sexual Activity   Alcohol use: Not Currently    Alcohol/week: 2.0 standard drinks of alcohol    Types: 2 Cans of beer per week   Drug use: No   Sexual activity: Yes    Birth control/protection: None  Other Topics Concern   Not on file  Social History Narrative   Not on file   Social Determinants of Health   Financial Resource Strain: Medium Risk (05/24/2023)   Overall Financial Resource Strain (CARDIA)    Difficulty of Paying Living Expenses: Somewhat hard  Food Insecurity: Food Insecurity Present (06/07/2023)   Hunger Vital Sign    Worried About Running Out of Food in the Last Year: Sometimes true    Ran Out of Food in the Last Year: Sometimes true  Transportation Needs: No Transportation Needs (05/24/2023)   PRAPARE - Administrator, Civil Service (Medical): No    Lack of Transportation (Non-Medical): No  Physical Activity: Sufficiently Active (05/24/2023)   Exercise Vital Sign    Days of Exercise per Week: 7  days    Minutes of Exercise per Session: 30 min  Stress: No Stress Concern Present (05/24/2023)  [employer] of Occupational Health - Occupational Stress Questionnaire    Feeling of Stress : Not at all  Social Connections: Moderately Isolated (05/24/2023)   Social Connection and Isolation Panel [NHANES]    Frequency of Communication with Friends and Family: More than three times a week    Frequency of Social Gatherings with Friends and Family: More than three times a week    Attends Religious Services: Never    Database administrator or Organizations: No    Attends Banker Meetings: Never    Marital Status: Living with partner  Intimate Partner Violence: Not At Risk (05/24/2023)   Humiliation, Afraid, Rape, and Kick questionnaire    Fear of Current or Ex-Partner: No    Emotionally Abused: No    Physically Abused: No    Sexually Abused: No    Physical Exam      Future Appointments  Date Time Provider Department Center  09/28/2023  3:30 PM [facility] PA/NP [facility] None  10/10/2023  1:55 PM Sheilah Pigeon, PA-C [facility]  11/23/2023  1:40 PM Ivonne Andrew, NP [facility] None  12/04/2023  7:40 AM [facility] DEVICE REMOTES [facility]  03/05/2024  7:40 AM [facility] DEVICE REMOTES [facility]  05/29/2024  1:00 PM [facility]-ANNUAL WELLNESS VISIT [facility] None  06/05/2024  7:40 AM [facility] DEVICE REMOTES [facility]  09/05/2024  7:40 AM [facility] DEVICE REMOTES [facility]

## 2023-09-27 ENCOUNTER — Telehealth (HOSPITAL_COMMUNITY): Payer: Self-pay

## 2023-09-27 NOTE — Telephone Encounter (Signed)
Called and left patient a voice message to confirm/remind patient of their appointment at the [facility] on 09/28/2023.   And to bring in all medications and/or complete list.

## 2023-09-28 ENCOUNTER — Other Ambulatory Visit (HOSPITAL_COMMUNITY): Payer: Self-pay | Admitting: Cardiology

## 2023-09-28 ENCOUNTER — Encounter (HOSPITAL_COMMUNITY): Payer: Self-pay

## 2023-09-28 ENCOUNTER — Ambulatory Visit (HOSPITAL_COMMUNITY)
Admission: RE | Admit: 2023-09-28 | Discharge: 2023-09-28 | Disposition: A | Discharge status: Home / Self Care | Payer: Medicare Other | Source: Ambulatory Visit | Attending: Family Medicine | Admitting: Family Medicine

## 2023-09-28 ENCOUNTER — Inpatient Hospital Stay (HOSPITAL_COMMUNITY)
Admission: RE | Admit: 2023-09-28 | Discharge: 2023-09-28 | Disposition: A | Discharge status: Home / Self Care | Payer: Medicare Other | Source: Ambulatory Visit | Attending: Cardiology | Admitting: Cardiology

## 2023-09-28 VITALS — BP 106/74 | HR 64 | Wt 284.8 lb

## 2023-09-28 DIAGNOSIS — G4733 Obstructive sleep apnea (adult) (pediatric): Secondary | ICD-10-CM | POA: Insufficient documentation

## 2023-09-28 DIAGNOSIS — I2510 Atherosclerotic heart disease of native coronary artery without angina pectoris: Secondary | ICD-10-CM | POA: Diagnosis not present

## 2023-09-28 DIAGNOSIS — Z5986 Financial insecurity: Secondary | ICD-10-CM | POA: Diagnosis not present

## 2023-09-28 DIAGNOSIS — Z7984 Long term (current) use of oral hypoglycemic drugs: Secondary | ICD-10-CM | POA: Diagnosis not present

## 2023-09-28 DIAGNOSIS — I480 Paroxysmal atrial fibrillation: Secondary | ICD-10-CM | POA: Insufficient documentation

## 2023-09-28 DIAGNOSIS — I493 Ventricular premature depolarization: Secondary | ICD-10-CM

## 2023-09-28 DIAGNOSIS — Z8719 Personal history of other diseases of the digestive system: Secondary | ICD-10-CM

## 2023-09-28 DIAGNOSIS — Z955 Presence of coronary angioplasty implant and graft: Secondary | ICD-10-CM | POA: Diagnosis not present

## 2023-09-28 DIAGNOSIS — Z7985 Long-term (current) use of injectable non-insulin antidiabetic drugs: Secondary | ICD-10-CM | POA: Diagnosis not present

## 2023-09-28 DIAGNOSIS — E669 Obesity, unspecified: Secondary | ICD-10-CM | POA: Insufficient documentation

## 2023-09-28 DIAGNOSIS — Z6836 Body mass index (BMI) 36.0-36.9, adult: Secondary | ICD-10-CM | POA: Diagnosis not present

## 2023-09-28 DIAGNOSIS — Z635 Disruption of family by separation and divorce: Secondary | ICD-10-CM | POA: Diagnosis not present

## 2023-09-28 DIAGNOSIS — I10 Essential (primary) hypertension: Secondary | ICD-10-CM

## 2023-09-28 DIAGNOSIS — E1122 Type 2 diabetes mellitus with diabetic chronic kidney disease: Secondary | ICD-10-CM | POA: Insufficient documentation

## 2023-09-28 DIAGNOSIS — N1831 Chronic kidney disease, stage 3a: Secondary | ICD-10-CM | POA: Insufficient documentation

## 2023-09-28 DIAGNOSIS — I130 Hypertensive heart and chronic kidney disease with heart failure and stage 1 through stage 4 chronic kidney disease, or unspecified chronic kidney disease: Secondary | ICD-10-CM | POA: Insufficient documentation

## 2023-09-28 DIAGNOSIS — Z79899 Other long term (current) drug therapy: Secondary | ICD-10-CM | POA: Diagnosis not present

## 2023-09-28 DIAGNOSIS — I5022 Chronic systolic (congestive) heart failure: Secondary | ICD-10-CM | POA: Diagnosis not present

## 2023-09-28 LAB — BASIC METABOLIC PANEL
Anion gap: 10 (ref 5–15)
BUN: 17 mg/dL (ref 8–23)
CO2: 24 mmol/L (ref 22–32)
Calcium: 9 mg/dL (ref 8.9–10.3)
Chloride: 104 mmol/L (ref 98–111)
Creatinine, Ser: 1.16 mg/dL (ref 0.61–1.24)
GFR, Estimated: >60 mL/min (ref >60)
Glucose, Bld: 124 mg/dL — ABNORMAL HIGH (ref 70–99)
Potassium: 3.8 mmol/L (ref 3.5–5.1)
Sodium: 138 mmol/L (ref 135–145)

## 2023-09-28 LAB — BRAIN NATRIURETIC PEPTIDE: B Natriuretic Peptide: 58.8 pg/mL (ref 0.0–100.0)

## 2023-09-28 LAB — TSH: TSH: 2.201 u[IU]/mL (ref 0.350–4.500)

## 2023-09-28 NOTE — Patient Instructions (Addendum)
Good to see you today!  No medication changes  Your provider has recommended that  you wear a Zio Patch for 14 days.  This monitor will record your heart rhythm for our review.  IF you have any symptoms while wearing the monitor please press the button.  If you have any issues with the patch or you notice a red or orange light on it please call the company at [phone].  Once you remove the patch please mail it back to the company as soon as possible so we can get the results.   Labs done today, your results will be available in MyChart, we will contact you for abnormal readings.  Your physician has requested that you have an echocardiogram. Echocardiography is a painless test that uses sound waves to create images of your heart. It provides your doctor with information about the size and shape of your heart and how well your heart's chambers and valves are working. This procedure takes approximately one hour. There are no restrictions for this procedure. Please do NOT wear cologne, perfume, aftershave, or lotions (deodorant is allowed). Please arrive 15 minutes prior to your appointment time.  Please note: We ask at that you not bring children with you during ultrasound (echo/ vascular) testing. Due to room size and safety concerns, children are not allowed in the ultrasound rooms during exams. Our front office staff cannot provide observation of children in our lobby area while testing is being conducted. An adult accompanying a patient to their appointment will only be allowed in the ultrasound room at the discretion of the ultrasound technician under special circumstances. We apologize for any inconvenience.  Your physician recommends that you schedule a follow-up appointment in:  4 months(March 2025) call office in February to schedule an appointment  If you have any questions or concerns before your next appointment please send [place] a message through [facility] or call our office at  [phone].    TO LEAVE A MESSAGE FOR THE NURSE SELECT OPTION 2, PLEASE LEAVE A MESSAGE INCLUDING: YOUR NAME DATE OF BIRTH CALL BACK NUMBER REASON FOR CALL**this is important as we prioritize the call backs  YOU WILL RECEIVE A CALL BACK THE SAME DAY AS LONG AS YOU CALL BEFORE 4:00 PM  At the [facility], you and your health needs are our priority. As part of our continuing mission to provide you with exceptional heart care, we have created designated Provider Care Teams. These Care Teams include your primary Cardiologist (physician) and Advanced Practice Providers (APPs- Physician Assistants and Nurse Practitioners) who all work together to provide you with the care you need, when you need it.   You may see any of the following providers on your designated Care Team at your next follow up: Dr Arvilla Meres Dr Marca Ancona Dr. Dorthula Nettles Dr. Clearnce Hasten [name] Filbert Schilder, NP Robbie Lis, [place] [facility] [place], [place] Brynda Peon, NP [place] Lee, NP Karle Plumber, PharmD   Please be sure to bring in all your medications bottles to every appointment.    Thank you for choosing [facility]

## 2023-10-02 ENCOUNTER — Other Ambulatory Visit (HOSPITAL_COMMUNITY): Payer: Self-pay | Admitting: Emergency Medicine

## 2023-10-02 NOTE — Progress Notes (Unsigned)
Paramedicine Encounter    Patient ID: [name], male    DOB: October 05, 1961, 62 y.o.   MRN: [id number]   Complaints - feeling "heavy"  Assessment lung sounds clear, no pedal edema, noted distended neck veins.   Compliance with meds - 1 missed noon dose.   Pill box filled - for 2x weeks   Refills needed -  atorvastatin, duloxetine, escitalopram, iron, furosemide, gabapentin, pantoprazole, entresto, vitamin D  Meds changes since last visit - none    Social changes - none   VISIT SUMMARY**  Met with pt in his home today. Pt reported that he was feeling better from our last visit and that he had continued to wear his heart monitor from his last [facility] appointment. Pt was noted to have a 7 lb increase from last visit and 4 lbs since Friday. Pt had noted distended neck veins and distension in his abdomen, no pedal edema noted. Pt denied shortness of breath, dizziness, chest pains or palpitations. Pt was noted to also be hypertensive, but appears and reports compliance with his medication. Message sent to triage regarding weight gain. Pt's pill box was filled for 2x weeks. Will follow up in 2x weeks.    BP (!) 158/90   Pulse 94   Resp 16   Wt 288 lb 1.6 oz (130.7 kg)   SpO2 97%   BMI 36.99 kg/m  Weight yesterday-DNW Last visit weight-281 lbs  [name] EMT-P Community Paramedic  [phone]     ACTION: Home visit completed     Patient Care Team: Ivonne Andrew, NP as PCP - General (Pulmonary Disease) Quintella Reichert, MD as PCP - Cardiology (Cardiology) Laurey Morale, MD as PCP - Advanced Heart Failure (Cardiology) Lanier Prude, MD as PCP - Electrophysiology (Cardiology) Burna Sis, LCSW as Social Worker (Licensed Clinical Social Worker) Tressia Danas, MD (Inactive) as Consulting Physician (Gastroenterology)  Patient Active Problem List   Diagnosis Date Noted   PVC's (premature ventricular contractions) 09/28/2023   Acute right-sided low  back pain with right-sided sciatica 05/14/2023   Multiple joint pain 12/22/2022   Obesity (BMI 35.0-39.9 without comorbidity) 07/18/2022   Cellulitis 06/22/2022   Cellulitis, leg 06/20/2022   Presence of Watchman left atrial appendage closure device 10/28/2021   Atrial fibrillation (HCC) 10/27/2021   Melena    Chronic diastolic CHF (congestive heart failure) (HCC)    GI bleed 06/29/2021   Severe anemia 06/29/2021   Adenomatous polyp of ascending colon    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   Acute blood loss anemia    Angiodysplasia of stomach    Chronic anticoagulation    CHF exacerbation (HCC) 06/15/2020   AKI (acute kidney injury) (HCC) 06/15/2020   CKD (chronic kidney disease), stage III (HCC) 06/15/2020   Anemia due to chronic blood loss    Gastric AVM    Angiodysplasia of duodenum with hemorrhage    Chronic systolic CHF (congestive heart failure) (HCC) 04/05/2020   Anemia 04/05/2020   PAF (paroxysmal atrial fibrillation) (HCC) 04/05/2020   OSA (obstructive sleep apnea) 04/05/2020   Syncope and collapse 04/05/2020   Type 2 diabetes mellitus with hyperglycemia, with long-term current use of insulin (HCC) 12/03/2019   Diabetic polyneuropathy associated with type 2 diabetes mellitus (HCC) 12/03/2019   Erectile dysfunction 12/03/2019   Paranoid schizophrenia, chronic condition (HCC) 01/26/2015   Severe recurrent major depressive disorder with psychotic features (HCC) 01/26/2015   GAD (generalized anxiety disorder) 01/26/2015   OCD (obsessive compulsive disorder)  01/26/2015   Panic disorder without agoraphobia 01/26/2015   Insomnia 01/26/2015    Current Outpatient Medications:    acetaminophen (TYLENOL) 500 MG tablet, Take 2 tablets (1,000 mg total) by mouth 2 (two) times daily as needed. (Patient taking differently: Take 1,000 mg by mouth 2 (two) times daily as needed. Takes 1000mg  morning and night.), Disp: 180 tablet, Rfl: 3   albuterol  (VENTOLIN HFA) 108 (90 Base) MCG/ACT inhaler, Inhale 2 puffs into the lungs every 6 (six) hours as needed for wheezing or shortness of breath., Disp: 18 g, Rfl: 1   aspirin EC 81 MG tablet, Take 81 mg by mouth daily. Swallow whole., Disp: , Rfl:    atorvastatin (LIPITOR) 40 MG tablet, Take 1 tablet (40 mg total) by mouth daily., Disp: 90 tablet, Rfl: 3   blood glucose meter kit and supplies KIT, Dispense based on patient and insurance preference. Use up to four times daily as directed. (FOR ICD-9 250.00, 250.01)., Disp: 1 each, Rfl: 0   Continuous Glucose Receiver (FREESTYLE LIBRE 3 READER) DEVI, 1 each by Does not apply route daily. Use to check glucose continuously, Disp: 1 each, Rfl: 1   Continuous Glucose Sensor (FREESTYLE LIBRE 3 SENSOR) MISC, Place 1 sensor on the skin every 14 days. Use to check glucose continuously, Disp: 6 each, Rfl: 2   diclofenac Sodium (VOLTAREN) 1 % GEL, Apply 2 g topically daily as needed for pain., Disp: , Rfl:    DULoxetine (CYMBALTA) 30 MG capsule, Take 1 capsule by mouth once daily, Disp: 30 capsule, Rfl: 2   empagliflozin (JARDIANCE) 10 MG TABS tablet, TAKE 1 TABLET BY MOUTH ONCE DAILY ., Disp: 90 tablet, Rfl: 3   escitalopram (LEXAPRO) 10 MG tablet, Take 10 mg by mouth daily. Prescribed by Dr. Elvera Lennox. Dimkpa, Disp: , Rfl:    ferrous sulfate 325 (65 FE) MG tablet, Take 1 tablet (325 mg total) by mouth every other day., Disp: 30 tablet, Rfl: 1   furosemide (LASIX) 40 MG tablet, Take 1.5 tablets (60 mg total) by mouth 2 (two) times daily., Disp: 200 tablet, Rfl: 5   gabapentin (NEURONTIN) 800 MG tablet, Take 1 tablet (800 mg total) by mouth 3 (three) times daily., Disp: 90 tablet, Rfl: 5   HUMALOG MIX 75/25 KWIKPEN (75-25) 100 UNIT/ML KwikPen, DIAL AND INJECT 30 UNITS UNDER THE SKIN TWICE DAILY, Disp: 75 mL, Rfl: 0   metFORMIN (GLUCOPHAGE-XR) 500 MG 24 hr tablet, Take 1 tablet (500 mg total) by mouth 2 (two) times daily with a meal., Disp: 180 tablet, Rfl: 1   metoprolol  succinate (TOPROL-XL) 50 MG 24 hr tablet, Take 1 tablet (50 mg total) by mouth daily., Disp: 180 tablet, Rfl: 3   Multiple Vitamins-Minerals (CENTRUM SILVER 50+MEN) TABS, Take 1 tablet by mouth daily., Disp: , Rfl:    Omega-3 Fatty Acids (FISH OIL) 1000 MG CAPS, Take 1,000 mg by mouth at bedtime., Disp: , Rfl:    pantoprazole (PROTONIX) 40 MG tablet, Take 1 tablet by mouth once daily, Disp: 90 tablet, Rfl: 0   potassium chloride SA (KLOR-CON M) 20 MEQ tablet, Take 2 tablets (40 mEq total) by mouth daily., Disp: 60 tablet, Rfl: 6   RELION PEN NEEDLES 31G X 6 MM MISC, INJECT 1 PEN INTO THE SKIN 2 TIMES DAILY, Disp: 100 each, Rfl: 0   sacubitril-valsartan (ENTRESTO) 24-26 MG, Take 1 tablet by mouth 2 (two) times daily. NEEDS FOLLOW UP APPOINTMENT FOR MORE REFILLS, Disp: 180 tablet, Rfl: 0   Semaglutide, 2  MG/DOSE, (OZEMPIC, 2 MG/DOSE,) 8 MG/3ML SOPN, Inject 2 mg into the skin once a week. Monday, Disp: , Rfl:    sildenafil (VIAGRA) 100 MG tablet, TAKE 1/2 TO 1 (ONE-HALF TO ONE) TABLET BY MOUTH ONCE DAILY AS NEEDED FOR ERECTILE DYSFUNCTION, Disp: 25 tablet, Rfl: 0   spironolactone (ALDACTONE) 25 MG tablet, Take 1 tablet (25 mg total) by mouth daily., Disp: 90 tablet, Rfl: 3   tadalafil (CIALIS) 20 MG tablet, Take 20 mg by mouth daily as needed., Disp: , Rfl:    Vitamin D, Ergocalciferol, (DRISDOL) 1.25 MG (50000 UNIT) CAPS capsule, Take 1 capsule (50,000 Units total) by mouth once a week., Disp: 5 capsule, Rfl: 2   colchicine 0.6 MG tablet, Daily for 7 days then daily PRN for gout flare (Patient not taking: Reported on 10/02/2023), Disp: 30 tablet, Rfl: 0   QUEtiapine (SEROQUEL) 300 MG tablet, Take 1.5 tablets (450 mg total) by mouth at bedtime. (Patient not taking: Reported on 10/02/2023), Disp: 135 tablet, Rfl: 1   zinc gluconate 50 MG tablet, Take 50 mg by mouth daily. (Patient not taking: Reported on 10/02/2023), Disp: , Rfl:  No Known Allergies   Social History   Socioeconomic History   Marital  status: Legally Separated    Spouse name: Not on file   Number of children: Not on file   Years of education: Not on file   Highest education level: Not on file  Occupational History   Not on file  Tobacco Use   Smoking status: Never    Passive exposure: Never   Smokeless tobacco: Never  Vaping Use   Vaping status: Never Used  Substance and Sexual Activity   Alcohol use: Not Currently    Alcohol/week: 2.0 standard drinks of alcohol    Types: 2 Cans of beer per week   Drug use: No   Sexual activity: Yes    Birth control/protection: None  Other Topics Concern   Not on file  Social History Narrative   Not on file   Social Determinants of Health   Financial Resource Strain: Medium Risk (05/24/2023)   Overall Financial Resource Strain (CARDIA)    Difficulty of Paying Living Expenses: Somewhat hard  Food Insecurity: Food Insecurity Present (06/07/2023)   Hunger Vital Sign    Worried About Running Out of Food in the Last Year: Sometimes true    Ran Out of Food in the Last Year: Sometimes true  Transportation Needs: No Transportation Needs (05/24/2023)   PRAPARE - Administrator, Civil Service (Medical): No    Lack of Transportation (Non-Medical): No  Physical Activity: Sufficiently Active (05/24/2023)   Exercise Vital Sign    Days of Exercise per Week: 7 days    Minutes of Exercise per Session: 30 min  Stress: No Stress Concern Present (05/24/2023)   [employer] of Occupational Health - Occupational Stress Questionnaire    Feeling of Stress : Not at all  Social Connections: Moderately Isolated (05/24/2023)   Social Connection and Isolation Panel [NHANES]    Frequency of Communication with Friends and Family: More than three times a week    Frequency of Social Gatherings with Friends and Family: More than three times a week    Attends Religious Services: Never    Database administrator or Organizations: No    Attends Banker Meetings: Never     Marital Status: Living with partner  Intimate Partner Violence: Not At Risk (05/24/2023)   Humiliation, Afraid, Rape, and  Kick questionnaire    Fear of Current or Ex-Partner: No    Emotionally Abused: No    Physically Abused: No    Sexually Abused: No    Physical Exam      Future Appointments  Date Time Provider Department Center  10/10/2023  1:55 PM Sheilah Pigeon, PA-C [facility]  11/01/2023  3:00 PM [facility] ECHO OP 1 [facility]  11/23/2023  1:40 PM Ivonne Andrew, NP [facility] None  12/04/2023  7:40 AM [facility] DEVICE REMOTES [facility]  03/05/2024  7:40 AM [facility] DEVICE REMOTES [facility]  05/29/2024  1:00 PM [facility]-ANNUAL WELLNESS VISIT [facility] None  06/05/2024  7:40 AM [facility] DEVICE REMOTES [facility]  09/05/2024  7:40 AM [facility] DEVICE REMOTES [facility]

## 2023-10-08 ENCOUNTER — Other Ambulatory Visit: Payer: Self-pay | Admitting: Nurse Practitioner

## 2023-10-08 ENCOUNTER — Other Ambulatory Visit (HOSPITAL_COMMUNITY): Payer: Self-pay | Admitting: Family Medicine

## 2023-10-08 NOTE — Telephone Encounter (Signed)
Please advise Kh

## 2023-10-08 NOTE — Progress Notes (Unsigned)
Cardiology Office Note Date:  10/08/2023  Patient ID:  [name], [name] 1961/05/22, MRN [id number] PCP:  Ivonne Andrew, NP  Cardiologist:  Dr. Mayford Knife AHF: Dr. Shirlee Latch Electrophysiologist: Dr. Lalla Brothers    Chief Complaint:  *** 6 mo  History of Present Illness: [name] is a 62 y.o. male with history of CAD (PCI 2012), chronic CHF, ICM, ICD, schizophrenia, AFib, recurrent GIB > Watchman (Dec 2022), DM, HTN, HLD, OSA w/CPAP.  Record indicates: He can write his name only. He is only able to read a few words. Thinks he completed the 7th grade.  He comes in today to be seen for Dr. Lalla Brothers, last seen by him at the time of the LAAO implant Dec 2022  Saw HF team APP 12/26/21, doing well, remained off ETOH, insurance declined new sleep study, they were working on this, planned for labs, no changes were made.  More recently saw the structural team APP on 04/26/22, plavix stopped > ASA 81mg  daily.  No further need for SBE prophylaxis.  I saw him 08/01/22 He feels well The day he got shocked he had no cardiac awareness, no CP. He had been busy that day, had a chest wall trauma a couple days ahead, but no ongoing discomfort He felt "fine" before and after the shock. Has not had any kind of near syncope or syncope. No SOB, denies DOE  He had gotten inappropriate ATP > shock for rapid AFib > did convert him to SR His coreg changed to toprol. AFib burden 9%  Subsequent device alerts fot SVTs, though had not yet made the change to Toprol, advised to make the switch ASAP  Pending CPAP machine, looks like was going to be available 9/26  Has para-medicine outreach, last seen by [facility] 08/29/22. VSS, feeling well, weight down 4 lbs on Ozempic, using CPAP every night.  Called his pharmacy with needed refill requests  I saw him 08/30/22 He is doing well. Using the CPAP every night He has not had any CP, palpitations or cardiac awareness No SOB No near syncope or  syncope. No shocks Got on the Toprol about 2 weeks ago Doing well No Afib No changes were made  Seen Dr. Shirlee Latch and team a few times since, most recent 09/28/23, doing well, reported medication  compliance (para-medicine team following as well)   *** CPAP compliance *** AF burden *** VT? *** volume   Device information Biotronik single chamber ICD implanted 09/03/2020 (VDD lead)  Past Medical History:  Diagnosis Date   AICD (automatic cardioverter/defibrillator) present    Anxiety    Atrial fibrillation (HCC)    CAD (coronary artery disease)    Cardiomyopathy (HCC)    CHF (congestive heart failure) (HCC) 09/2019   Depression    Diabetes mellitus    Erectile dysfunction 11/2019   GI bleeding    H/O right heart catheterization 09/2019   Hypertension    Presence of permanent cardiac pacemaker    Presence of Watchman left atrial appendage closure device 10/27/2021   27 mm Watchman Flex Device per Dr. Lalla Brothers   Schizophrenia [facility])    Sleep apnea    uses cpap    Past Surgical History:  Procedure Laterality Date   BIOPSY  04/19/2021   Procedure: BIOPSY;  Surgeon: Sherrilyn Rist, MD;  Location: [facility] ENDOSCOPY;  Service: Gastroenterology;;   COLONOSCOPY WITH PROPOFOL N/A 04/16/2021   Procedure: COLONOSCOPY WITH PROPOFOL;  Surgeon: Tressia Danas, MD;  Location: [facility] ENDOSCOPY;  Service: Gastroenterology;  Laterality: N/A;   CORONARY STENT PLACEMENT     ENTEROSCOPY N/A 04/08/2020   Procedure: ENTEROSCOPY;  Surgeon: Sherrilyn Rist, MD;  Location: [facility] ENDOSCOPY;  Service: Gastroenterology;  Laterality: N/A;   ENTEROSCOPY N/A 06/17/2020   Procedure: ENTEROSCOPY;  Surgeon: Hilarie Fredrickson, MD;  Location: [facility] ENDOSCOPY;  Service: Endoscopy;  Laterality: N/A;   ENTEROSCOPY N/A 04/15/2021   Procedure: ENTEROSCOPY;  Surgeon: Tressia Danas, MD;  Location: [facility] ENDOSCOPY;  Service: Gastroenterology;  Laterality: N/A;   ENTEROSCOPY N/A 04/19/2021   Procedure: ENTEROSCOPY;  Surgeon:  Sherrilyn Rist, MD;  Location: [facility] ENDOSCOPY;  Service: Gastroenterology;  Laterality: N/A;   ENTEROSCOPY N/A 07/01/2021   Procedure: ENTEROSCOPY;  Surgeon: Beverley Fiedler, MD;  Location: [facility] ENDOSCOPY;  Service: Gastroenterology;  Laterality: N/A;   GIVENS CAPSULE STUDY  04/16/2021   Procedure: GIVENS CAPSULE STUDY;  Surgeon: Tressia Danas, MD;  Location: [facility] ENDOSCOPY;  Service: Gastroenterology;;   HEMOSTASIS CLIP PLACEMENT  04/08/2020   Procedure: HEMOSTASIS CLIP PLACEMENT;  Surgeon: Sherrilyn Rist, MD;  Location: [facility] ENDOSCOPY;  Service: Gastroenterology;;   HEMOSTASIS CLIP PLACEMENT  07/01/2021   Procedure: HEMOSTASIS CLIP PLACEMENT;  Surgeon: Beverley Fiedler, MD;  Location: [facility] ENDOSCOPY;  Service: Gastroenterology;;   HEMOSTASIS CONTROL  04/08/2020   Procedure: HEMOSTASIS CONTROL;  Surgeon: Sherrilyn Rist, MD;  Location: [facility] ENDOSCOPY;  Service: Gastroenterology;;   HEMOSTASIS CONTROL  06/17/2020   Procedure: HEMOSTASIS CONTROL;  Surgeon: Hilarie Fredrickson, MD;  Location: [facility] ENDOSCOPY;  Service: Endoscopy;;   HERNIA REPAIR     HOT HEMOSTASIS N/A 04/15/2021   Procedure: HOT HEMOSTASIS (ARGON PLASMA COAGULATION/BICAP);  Surgeon: Tressia Danas, MD;  Location: [facility] ENDOSCOPY;  Service: Gastroenterology;  Laterality: N/A;   HOT HEMOSTASIS N/A 07/01/2021   Procedure: HOT HEMOSTASIS (ARGON PLASMA COAGULATION/BICAP);  Surgeon: Beverley Fiedler, MD;  Location: [facility] ENDOSCOPY;  Service: Gastroenterology;  Laterality: N/A;   ICD IMPLANT N/A 09/03/2020   Procedure: ICD IMPLANT;  Surgeon: Lanier Prude, MD;  Location: [facility] INVASIVE CV LAB;  Service: Cardiovascular;  Laterality: N/A;   LEFT ATRIAL APPENDAGE OCCLUSION N/A 10/27/2021   Procedure: LEFT ATRIAL APPENDAGE OCCLUSION;  Surgeon: Lanier Prude, MD;  Location: [facility] INVASIVE CV LAB;  Service: Cardiovascular;  Laterality: N/A;   POLYPECTOMY  04/16/2021   Procedure: POLYPECTOMY;  Surgeon: Tressia Danas, MD;  Location: [facility] ENDOSCOPY;  Service:  Gastroenterology;;   RIGHT/LEFT HEART CATH AND CORONARY ANGIOGRAPHY N/A 10/14/2019   Procedure: RIGHT/LEFT HEART CATH AND CORONARY ANGIOGRAPHY;  Surgeon: Lyn Records, MD;  Location: [facility] INVASIVE CV LAB;  Service: Cardiovascular;  Laterality: N/A;   SUBMUCOSAL TATTOO INJECTION  04/19/2021   Procedure: SUBMUCOSAL TATTOO INJECTION;  Surgeon: Sherrilyn Rist, MD;  Location: [facility] ENDOSCOPY;  Service: Gastroenterology;;   TEE WITHOUT CARDIOVERSION N/A 10/27/2021   Procedure: TRANSESOPHAGEAL ECHOCARDIOGRAM (TEE);  Surgeon: Lanier Prude, MD;  Location: [facility] INVASIVE CV LAB;  Service: Cardiovascular;  Laterality: N/A;   TEE WITHOUT CARDIOVERSION N/A 12/12/2021   Procedure: TRANSESOPHAGEAL ECHOCARDIOGRAM (TEE);  Surgeon: Sande Rives, MD;  Location: [facility] ENDOSCOPY;  Service: Cardiovascular;  Laterality: N/A;    Current Outpatient Medications  Medication Sig Dispense Refill   acetaminophen (TYLENOL) 500 MG tablet Take 2 tablets (1,000 mg total) by mouth 2 (two) times daily as needed. (Patient taking differently: Take 1,000 mg by mouth 2 (two) times daily as needed. Takes 1000mg  morning and night.) 180 tablet 3   albuterol (VENTOLIN HFA) 108 (90  Base) MCG/ACT inhaler Inhale 2 puffs into the lungs every 6 (six) hours as needed for wheezing or shortness of breath. 18 g 1   aspirin EC 81 MG tablet Take 81 mg by mouth daily. Swallow whole.     atorvastatin (LIPITOR) 40 MG tablet Take 1 tablet (40 mg total) by mouth daily. 90 tablet 3   blood glucose meter kit and supplies KIT Dispense based on patient and insurance preference. Use up to four times daily as directed. (FOR ICD-9 250.00, 250.01). 1 each 0   colchicine 0.6 MG tablet Daily for 7 days then daily PRN for gout flare (Patient not taking: Reported on 10/02/2023) 30 tablet 0   Continuous Glucose Receiver (FREESTYLE LIBRE 3 READER) DEVI 1 each by Does not apply route daily. Use to check glucose continuously 1 each 1   Continuous Glucose Sensor  (FREESTYLE LIBRE 3 SENSOR) MISC Place 1 sensor on the skin every 14 days. Use to check glucose continuously 6 each 2   diclofenac Sodium (VOLTAREN) 1 % GEL Apply 2 g topically daily as needed for pain.     DULoxetine (CYMBALTA) 30 MG capsule Take 1 capsule by mouth once daily 30 capsule 2   empagliflozin (JARDIANCE) 10 MG TABS tablet TAKE 1 TABLET BY MOUTH ONCE DAILY . 90 tablet 3   ENTRESTO 24-26 MG TAKE 1 TABLET BY MOUTH TWICE DAILY *FOLLOW UP APPOINTMENT FOR MORE REFILLS* 180 tablet 0   escitalopram (LEXAPRO) 10 MG tablet Take 10 mg by mouth daily. Prescribed by Dr. Elvera Lennox. Dimkpa     ferrous sulfate 325 (65 FE) MG tablet Take 1 tablet (325 mg total) by mouth every other day. 30 tablet 1   furosemide (LASIX) 40 MG tablet Take 1.5 tablets (60 mg total) by mouth 2 (two) times daily. 200 tablet 5   gabapentin (NEURONTIN) 800 MG tablet Take 1 tablet (800 mg total) by mouth 3 (three) times daily. 90 tablet 5   HUMALOG MIX 75/25 KWIKPEN (75-25) 100 UNIT/ML KwikPen DIAL AND INJECT 30 UNITS UNDER THE SKIN TWICE DAILY 75 mL 0   metFORMIN (GLUCOPHAGE-XR) 500 MG 24 hr tablet Take 1 tablet (500 mg total) by mouth 2 (two) times daily with a meal. 180 tablet 1   metoprolol succinate (TOPROL-XL) 50 MG 24 hr tablet Take 1 tablet (50 mg total) by mouth daily. 180 tablet 3   Multiple Vitamins-Minerals (CENTRUM SILVER 50+MEN) TABS Take 1 tablet by mouth daily.     Omega-3 Fatty Acids (FISH OIL) 1000 MG CAPS Take 1,000 mg by mouth at bedtime.     pantoprazole (PROTONIX) 40 MG tablet Take 1 tablet by mouth once daily 90 tablet 0   potassium chloride SA (KLOR-CON M) 20 MEQ tablet Take 2 tablets (40 mEq total) by mouth daily. 60 tablet 6   QUEtiapine (SEROQUEL) 300 MG tablet Take 1.5 tablets (450 mg total) by mouth at bedtime. (Patient not taking: Reported on 10/02/2023) 135 tablet 1   RELION PEN NEEDLES 31G X 6 MM MISC INJECT 1 PEN INTO THE SKIN 2 TIMES DAILY 100 each 0   Semaglutide, 2 MG/DOSE, (OZEMPIC, 2 MG/DOSE,) 8  MG/3ML SOPN Inject 2 mg into the skin once a week. Monday     sildenafil (VIAGRA) 100 MG tablet TAKE 1/2 TO 1 (ONE-HALF TO ONE) TABLET BY MOUTH ONCE DAILY AS NEEDED FOR ERECTILE DYSFUNCTION 25 tablet 0   spironolactone (ALDACTONE) 25 MG tablet Take 1 tablet (25 mg total) by mouth daily. 90 tablet 3   tadalafil (  CIALIS) 20 MG tablet Take 20 mg by mouth daily as needed.     Vitamin D, Ergocalciferol, (DRISDOL) 1.25 MG (50000 UNIT) CAPS capsule Take 1 capsule by mouth once a week 5 capsule 0   zinc gluconate 50 MG tablet Take 50 mg by mouth daily. (Patient not taking: Reported on 10/02/2023)     No current facility-administered medications for this visit.    Allergies:   Patient has no known allergies.   Social History:  The patient  reports that he has never smoked. He has never been exposed to tobacco smoke. He has never used smokeless tobacco. He reports that he does not currently use alcohol after a past usage of about 2.0 standard drinks of alcohol per week. He reports that he does not use drugs.   Family History:  The patient's family history includes Heart failure in his mother; Mental illness in his sister and sister.  ROS:  Please see the history of present illness.    All other systems are reviewed and otherwise negative.   PHYSICAL EXAM:  VS:  There were no vitals taken for this visit. BMI: There is no height or weight on file to calculate BMI. Well nourished, well developed, in no acute distress HEENT: normocephalic, atraumatic Neck: no JVD, carotid bruits or masses Cardiac: *** RRR; no significant murmurs, no rubs, or gallops Lungs: *** CTA b/l, no wheezing, rhonchi or rales Abd: soft, nontender MS: no deformity or atrophy Ext: *** no edema Skin: warm and dry, no rash Neuro:  No gross deficits appreciated Psych: euthymic mood, full affect  *** ICD site is stable, no tethering or discomfort   EKG:  not done today  Device interrogation done today and reviewed by myself:   *** Battery and lead measurements are good ***  TEE 12/12/21 IMPRESSIONS   1. 27 mm Watchman FLX. 2D max diameter 23.5 mm (13% compression). No  device thrombus or device leak. The device is well seated. No left  atrial/left atrial appendage thrombus was detected.   2. Left ventricular ejection fraction, by estimation, is 50 to 55%. The  left ventricle has low normal function. The left ventricle demonstrates  global hypokinesis.   3. Right ventricular systolic function is normal. The right ventricular  size is normal.   4. The mitral valve is grossly normal. Trivial mitral valve  regurgitation. No evidence of mitral stenosis.   5. The aortic valve is tricuspid. There is mild calcification of the  aortic valve. Aortic valve regurgitation is mild.   6. There is mild (Grade II) layered plaque involving the descending  aorta.    10/14/2019: LHC Known severe LV systolic dysfunction by echo with EF less than 25%.  Normal LVEDP 16 to 20 mmHg and mean pulmonary capillary wedge pressure 16 mmHg. Widely patent LAD First diagonal with severe segmental stenoses.  Relatively small vessel.  Totally occluded second obtuse marginal which has been previously stented.  Fills late by collaterals, sluggishly.  First obtuse marginal is tiny and has proximal 90% stenosis. RCA appears codominant, has an 85 to 90% proximal stenosis, and 60 to 70% proximal stenosis.   RECOMMENDATIONS:   Diffuse coronary disease as outlined above.  The entire LAD is widely patent.  Circumflex is patent but the first obtuse marginal has high-grade stenosis in the previously stented second obtuse marginal is totally occluded.  Seems medical therapy would be appropriate. Aggressive medical therapy for systolic heart failure.    Recent Labs: 08/23/2023: ALT 26; Hemoglobin 14.1;  Platelets 290 09/28/2023: B Natriuretic Peptide 58.8; BUN 17; Creatinine, Ser 1.16; Potassium 3.8; Sodium 138; TSH 2.201  01/29/2023: Cholesterol 114;  HDL 34; LDL Cholesterol 58; Total CHOL/HDL Ratio 3.4; Triglycerides 111; VLDL 22   Estimated Creatinine Clearance: 93.7 mL/min (by C-G formula based on SCr of 1.16 mg/dL).   Wt Readings from Last 3 Encounters:  10/02/23 288 lb 1.6 oz (130.7 kg)  09/28/23 284 lb 12.8 oz (129.2 kg)  09/26/23 281 lb 9.6 oz (127.7 kg)     Other studies reviewed: Additional studies/records reviewed today include: summarized above  ASSESSMENT AND PLAN:  ICD *** Intact function *** No programming changes made   Paroxysmal AFib SVT (1:1 tachycardias on his device noted) CHA2DS2Vasc is 4, off OAC 2/2 recurrent GIB/AVMs S/p Watchman Dec 2022 *** % burden   CAD *** No anginal symptoms C/w Dr. Mayford Knife   Chronic CHF (systolic) Recovered LVEF C/w HF team *** No symptoms or exam findings of volume OL     Disposition: ***   Current medicines are reviewed at length with the patient today.  The patient did not have any concerns regarding medicines.  Norma Fredrickson, PA-C 10/08/2023 1:22 PM     [facility] [address] [place] [address] [phone] (office)  [phone] (fax)

## 2023-10-10 ENCOUNTER — Ambulatory Visit: Payer: Medicare Other | Attending: Physician Assistant | Admitting: Physician Assistant

## 2023-10-10 ENCOUNTER — Encounter: Payer: Self-pay | Admitting: Physician Assistant

## 2023-10-10 VITALS — BP 130/76 | HR 72 | Ht 74.0 in | Wt 286.0 lb

## 2023-10-10 DIAGNOSIS — I2510 Atherosclerotic heart disease of native coronary artery without angina pectoris: Secondary | ICD-10-CM

## 2023-10-10 DIAGNOSIS — I480 Paroxysmal atrial fibrillation: Secondary | ICD-10-CM

## 2023-10-10 DIAGNOSIS — I5022 Chronic systolic (congestive) heart failure: Secondary | ICD-10-CM

## 2023-10-10 DIAGNOSIS — Z95810 Presence of automatic (implantable) cardiac defibrillator: Secondary | ICD-10-CM

## 2023-10-10 DIAGNOSIS — I493 Ventricular premature depolarization: Secondary | ICD-10-CM

## 2023-10-10 LAB — CUP PACEART INCLINIC DEVICE CHECK
Date Time Interrogation Session: 20241113174807
Implantable Lead Connection Status: 753985
Implantable Lead Implant Date: 20211008
Implantable Lead Location: 753860
Implantable Lead Model: 436910
Implantable Lead Serial Number: 81404997
Implantable Pulse Generator Implant Date: 20211008
Lead Channel Pacing Threshold Amplitude: 0.5 V
Lead Channel Pacing Threshold Pulse Width: 0.4 ms
Lead Channel Sensing Intrinsic Amplitude: 19.6 mV
Lead Channel Sensing Intrinsic Amplitude: 4.3 mV
Pulse Gen Model: 429525
Pulse Gen Serial Number: 84810752

## 2023-10-10 NOTE — Patient Instructions (Signed)
Medication Instructions:  Your physician recommends that you continue on your current medications as directed. Please refer to the Current Medication list given to you today.  *If you need a refill on your cardiac medications before your next appointment, please call your pharmacy*   Lab Work: None ordered   Testing/Procedures: None ordered   Follow-Up: At [facility], you and your health needs are our priority.  As part of our continuing mission to provide you with exceptional heart care, we have created designated Provider Care Teams.  These Care Teams include your primary Cardiologist (physician) and Advanced Practice Providers (APPs -  Physician Assistants and Nurse Practitioners) who all work together to provide you with the care you need, when you need it.  Your next appointment:   1 year(s)  The format for your next appointment:   In Person  Provider:   Steffanie Dunn, MD or Francis Dowse, PA-C{    Thank you for choosing [facility]!!   [phone]

## 2023-10-17 ENCOUNTER — Other Ambulatory Visit (HOSPITAL_COMMUNITY): Payer: Self-pay | Admitting: Emergency Medicine

## 2023-10-17 ENCOUNTER — Other Ambulatory Visit: Payer: Self-pay | Admitting: Physical Medicine & Rehabilitation

## 2023-10-17 NOTE — Progress Notes (Signed)
Paramedicine Encounter    Patient ID: [name], male    DOB: Jul 23, 1961, 62 y.o.   MRN: [id number]   Complaints- none    Assessment - Lung sounds clear, no edema noted.   Compliance with meds - missed 3 am, 1 noon, 8 eve doses in 2 weeks   Pill box filled  - for 1x week  Refills needed - cymbalta, furosemide, gabapentin, pantoprazole, vit d    Meds changes since last visit - none    Social changes - continued non-compliance with medication and dietary restrictions.    VISIT SUMMARY**  I met with pt in the home today. [name] advised that he has not been compliant with his dietary restrictions. It was noted that pt was not compliant with his medications as noted. Pt proceeded to smoke during our visit. Pt was noted to be hypertensive, but had not taken his evening medication yet. Pt was advised to increase his compliance and was reminded of the parameters he's been given by the HF clinic. Pt denied any complaints, denied shortness of breath, chest pain, palpitations, or dizziness. Pt's pill box was filled for 1x week. Upcoming appointments were reviewed. Will follow up in 1x week.   BP (!) 170/108   Pulse 68   Wt 290 lb (131.5 kg)   SpO2 98%   BMI 37.23 kg/m  Last visit weight-288 lbs  [name] EMT-P Community Paramedic  [phone]     ACTION: Home visit completed     Patient Care Team: Ivonne Andrew, NP as PCP - General (Pulmonary Disease) Quintella Reichert, MD as PCP - Cardiology (Cardiology) Laurey Morale, MD as PCP - Advanced Heart Failure (Cardiology) Lanier Prude, MD as PCP - Electrophysiology (Cardiology) Burna Sis, LCSW as Social Worker (Licensed Clinical Social Worker) Tressia Danas, MD (Inactive) as Consulting Physician (Gastroenterology)  Patient Active Problem List   Diagnosis Date Noted   PVC's (premature ventricular contractions) 09/28/2023   Acute right-sided low back pain with right-sided sciatica 05/14/2023    Multiple joint pain 12/22/2022   Obesity (BMI 35.0-39.9 without comorbidity) 07/18/2022   Cellulitis 06/22/2022   Cellulitis, leg 06/20/2022   Presence of Watchman left atrial appendage closure device 10/28/2021   Atrial fibrillation (HCC) 10/27/2021   Melena    Chronic diastolic CHF (congestive heart failure) (HCC)    GI bleed 06/29/2021   Severe anemia 06/29/2021   Adenomatous polyp of ascending colon    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   Acute blood loss anemia    Angiodysplasia of stomach    Chronic anticoagulation    CHF exacerbation (HCC) 06/15/2020   AKI (acute kidney injury) (HCC) 06/15/2020   CKD (chronic kidney disease), stage III (HCC) 06/15/2020   Anemia due to chronic blood loss    Gastric AVM    Angiodysplasia of duodenum with hemorrhage    Chronic systolic CHF (congestive heart failure) (HCC) 04/05/2020   Anemia 04/05/2020   PAF (paroxysmal atrial fibrillation) (HCC) 04/05/2020   OSA (obstructive sleep apnea) 04/05/2020   Syncope and collapse 04/05/2020   Type 2 diabetes mellitus with hyperglycemia, with long-term current use of insulin (HCC) 12/03/2019   Diabetic polyneuropathy associated with type 2 diabetes mellitus (HCC) 12/03/2019   Erectile dysfunction 12/03/2019   Paranoid schizophrenia, chronic condition (HCC) 01/26/2015   Severe recurrent major depressive disorder with psychotic features (HCC) 01/26/2015   GAD (generalized anxiety disorder) 01/26/2015   OCD (obsessive compulsive disorder) 01/26/2015   Panic disorder without agoraphobia  01/26/2015   Insomnia 01/26/2015    Current Outpatient Medications:    acetaminophen (TYLENOL) 500 MG tablet, Take 2 tablets (1,000 mg total) by mouth 2 (two) times daily as needed. (Patient taking differently: Take 1,000 mg by mouth 2 (two) times daily as needed. Takes 1000mg  morning and night.), Disp: 180 tablet, Rfl: 3   albuterol (VENTOLIN HFA) 108 (90 Base) MCG/ACT inhaler, Inhale 2  puffs into the lungs every 6 (six) hours as needed for wheezing or shortness of breath., Disp: 18 g, Rfl: 1   aspirin EC 81 MG tablet, Take 81 mg by mouth daily. Swallow whole., Disp: , Rfl:    atorvastatin (LIPITOR) 40 MG tablet, Take 1 tablet (40 mg total) by mouth daily., Disp: 90 tablet, Rfl: 3   blood glucose meter kit and supplies KIT, Dispense based on patient and insurance preference. Use up to four times daily as directed. (FOR ICD-9 250.00, 250.01)., Disp: 1 each, Rfl: 0   Continuous Glucose Receiver (FREESTYLE LIBRE 3 READER) DEVI, 1 each by Does not apply route daily. Use to check glucose continuously, Disp: 1 each, Rfl: 1   Continuous Glucose Sensor (FREESTYLE LIBRE 3 SENSOR) MISC, Place 1 sensor on the skin every 14 days. Use to check glucose continuously, Disp: 6 each, Rfl: 2   diclofenac Sodium (VOLTAREN) 1 % GEL, Apply 2 g topically daily as needed for pain., Disp: , Rfl:    DULoxetine (CYMBALTA) 30 MG capsule, Take 1 capsule by mouth once daily, Disp: 30 capsule, Rfl: 2   empagliflozin (JARDIANCE) 10 MG TABS tablet, TAKE 1 TABLET BY MOUTH ONCE DAILY ., Disp: 90 tablet, Rfl: 3   ENTRESTO 24-26 MG, TAKE 1 TABLET BY MOUTH TWICE DAILY *FOLLOW UP APPOINTMENT FOR MORE REFILLS*, Disp: 180 tablet, Rfl: 0   escitalopram (LEXAPRO) 10 MG tablet, Take 10 mg by mouth daily. Prescribed by Dr. Elvera Lennox. Dimkpa, Disp: , Rfl:    ferrous sulfate 325 (65 FE) MG tablet, Take 1 tablet (325 mg total) by mouth every other day., Disp: 30 tablet, Rfl: 1   furosemide (LASIX) 40 MG tablet, Take 1.5 tablets (60 mg total) by mouth 2 (two) times daily., Disp: 200 tablet, Rfl: 5   gabapentin (NEURONTIN) 800 MG tablet, Take 1 tablet (800 mg total) by mouth 3 (three) times daily., Disp: 90 tablet, Rfl: 5   HUMALOG MIX 75/25 KWIKPEN (75-25) 100 UNIT/ML KwikPen, DIAL AND INJECT 30 UNITS UNDER THE SKIN TWICE DAILY, Disp: 75 mL, Rfl: 0   metFORMIN (GLUCOPHAGE-XR) 500 MG 24 hr tablet, Take 1 tablet (500 mg total) by mouth 2  (two) times daily with a meal., Disp: 180 tablet, Rfl: 1   metoprolol succinate (TOPROL-XL) 50 MG 24 hr tablet, Take 1 tablet (50 mg total) by mouth daily., Disp: 180 tablet, Rfl: 3   Multiple Vitamins-Minerals (CENTRUM SILVER 50+MEN) TABS, Take 1 tablet by mouth daily., Disp: , Rfl:    Omega-3 Fatty Acids (FISH OIL) 1000 MG CAPS, Take 1,000 mg by mouth at bedtime., Disp: , Rfl:    pantoprazole (PROTONIX) 40 MG tablet, Take 1 tablet by mouth once daily, Disp: 90 tablet, Rfl: 0   potassium chloride SA (KLOR-CON M) 20 MEQ tablet, Take 2 tablets (40 mEq total) by mouth daily., Disp: 60 tablet, Rfl: 6   RELION PEN NEEDLES 31G X 6 MM MISC, INJECT 1 PEN INTO THE SKIN 2 TIMES DAILY, Disp: 100 each, Rfl: 0   Semaglutide, 2 MG/DOSE, (OZEMPIC, 2 MG/DOSE,) 8 MG/3ML SOPN, Inject 2 mg into  the skin once a week. Monday, Disp: , Rfl:    sildenafil (VIAGRA) 100 MG tablet, TAKE 1/2 TO 1 (ONE-HALF TO ONE) TABLET BY MOUTH ONCE DAILY AS NEEDED FOR ERECTILE DYSFUNCTION, Disp: 25 tablet, Rfl: 0   spironolactone (ALDACTONE) 25 MG tablet, Take 1 tablet (25 mg total) by mouth daily., Disp: 90 tablet, Rfl: 3   tadalafil (CIALIS) 20 MG tablet, Take 20 mg by mouth daily as needed., Disp: , Rfl:    Vitamin D, Ergocalciferol, (DRISDOL) 1.25 MG (50000 UNIT) CAPS capsule, Take 1 capsule by mouth once a week, Disp: 5 capsule, Rfl: 0   colchicine 0.6 MG tablet, Daily for 7 days then daily PRN for gout flare (Patient not taking: Reported on 10/17/2023), Disp: 30 tablet, Rfl: 0   QUEtiapine (SEROQUEL) 300 MG tablet, Take 1.5 tablets (450 mg total) by mouth at bedtime. (Patient not taking: Reported on 10/17/2023), Disp: 135 tablet, Rfl: 1   zinc gluconate 50 MG tablet, Take 50 mg by mouth daily. (Patient not taking: Reported on 10/17/2023), Disp: , Rfl:  No Known Allergies   Social History   Socioeconomic History   Marital status: Legally Separated    Spouse name: Not on file   Number of children: Not on file   Years of  education: Not on file   Highest education level: Not on file  Occupational History   Not on file  Tobacco Use   Smoking status: Never    Passive exposure: Never   Smokeless tobacco: Never  Vaping Use   Vaping status: Never Used  Substance and Sexual Activity   Alcohol use: Not Currently    Alcohol/week: 2.0 standard drinks of alcohol    Types: 2 Cans of beer per week   Drug use: No   Sexual activity: Yes    Birth control/protection: None  Other Topics Concern   Not on file  Social History Narrative   Not on file   Social Determinants of Health   Financial Resource Strain: Medium Risk (05/24/2023)   Overall Financial Resource Strain (CARDIA)    Difficulty of Paying Living Expenses: Somewhat hard  Food Insecurity: Food Insecurity Present (06/07/2023)   Hunger Vital Sign    Worried About Running Out of Food in the Last Year: Sometimes true    Ran Out of Food in the Last Year: Sometimes true  Transportation Needs: No Transportation Needs (05/24/2023)   PRAPARE - Administrator, Civil Service (Medical): No    Lack of Transportation (Non-Medical): No  Physical Activity: Sufficiently Active (05/24/2023)   Exercise Vital Sign    Days of Exercise per Week: 7 days    Minutes of Exercise per Session: 30 min  Stress: No Stress Concern Present (05/24/2023)   [employer] of Occupational Health - Occupational Stress Questionnaire    Feeling of Stress : Not at all  Social Connections: Moderately Isolated (05/24/2023)   Social Connection and Isolation Panel [NHANES]    Frequency of Communication with Friends and Family: More than three times a week    Frequency of Social Gatherings with Friends and Family: More than three times a week    Attends Religious Services: Never    Database administrator or Organizations: No    Attends Banker Meetings: Never    Marital Status: Living with partner  Intimate Partner Violence: Not At Risk (05/24/2023)   Humiliation,  Afraid, Rape, and Kick questionnaire    Fear of Current or Ex-Partner: No  Emotionally Abused: No    Physically Abused: No    Sexually Abused: No    Physical Exam      Future Appointments  Date Time Provider Department Center  11/01/2023  3:00 PM [facility] ECHO OP 1 [facility]  11/23/2023  1:40 PM Ivonne Andrew, NP [facility] None  12/04/2023  7:40 AM [facility] DEVICE REMOTES [facility]  03/05/2024  7:40 AM [facility] DEVICE REMOTES [facility]  05/29/2024  1:00 PM [facility]-ANNUAL WELLNESS VISIT [facility] None  06/05/2024  7:40 AM [facility] DEVICE REMOTES [facility]  09/05/2024  7:40 AM [facility] DEVICE REMOTES [facility]

## 2023-10-24 ENCOUNTER — Telehealth (HOSPITAL_COMMUNITY): Payer: Self-pay | Admitting: Emergency Medicine

## 2023-10-24 NOTE — Telephone Encounter (Signed)
Attempted to reach [facility] again at 13:32 and 15:29, left message both times advising that we needed to fill his pill box prior to the holiday.   Will attempt to follow up next week.   [name] EMT-P Community Paramedic  [phone]

## 2023-10-24 NOTE — Telephone Encounter (Signed)
I was calling [name] to schedule his home visit time for today. Pt's phone is "not available".   Will continue to follow up.  [name] EMT-P Community Paramedic  [phone]

## 2023-10-29 ENCOUNTER — Other Ambulatory Visit: Payer: Self-pay | Admitting: Physical Medicine & Rehabilitation

## 2023-10-29 ENCOUNTER — Other Ambulatory Visit (HOSPITAL_COMMUNITY): Payer: Self-pay | Admitting: Emergency Medicine

## 2023-10-29 NOTE — Progress Notes (Unsigned)
Paramedicine Encounter    Patient ID: [name], male    DOB: 1961-02-09, 62 y.o.   MRN: [id number]   Complaints " feels like I've been without my medicines"   Assessment - obvious shortness of breath.   Compliance with meds - 5 days of non-compliance due to inability to reach pt. .   Pill box filled - for 1 week. Missing medications pt has  not picked up from the pharmacy  Refills needed - vit d, zinc, pantoprazole, gabapentin, furosemide, iron, entresto, duloxetine.   Meds changes since last visit - none    Social changes - looking for new house with wife, family stressors.    VISIT SUMMARY**  There were no vitals taken for this visit. Weight yesterday-*** Last visit weight-290 lbs      ACTION: {Paramed Action:[phone]}     Patient Care Team: Ivonne Andrew, NP as PCP - General (Pulmonary Disease) Quintella Reichert, MD as PCP - Cardiology (Cardiology) Laurey Morale, MD as PCP - Advanced Heart Failure (Cardiology) Lanier Prude, MD as PCP - Electrophysiology (Cardiology) Burna Sis, LCSW as Social Worker (Licensed Clinical Social Worker) Tressia Danas, MD (Inactive) as Consulting Physician (Gastroenterology)  Patient Active Problem List   Diagnosis Date Noted  . PVC's (premature ventricular contractions) 09/28/2023  . Acute right-sided low back pain with right-sided sciatica 05/14/2023  . Multiple joint pain 12/22/2022  . Obesity (BMI 35.0-39.9 without comorbidity) 07/18/2022  . Cellulitis 06/22/2022  . Cellulitis, leg 06/20/2022  . Presence of Watchman left atrial appendage closure device 10/28/2021  . Atrial fibrillation (HCC) 10/27/2021  . Melena   . Chronic diastolic CHF (congestive heart failure) (HCC)   . GI bleed 06/29/2021  . Severe anemia 06/29/2021  . Adenomatous polyp of ascending colon   . 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 Acute blood loss anemia   . Angiodysplasia of stomach   .  Chronic anticoagulation   . CHF exacerbation (HCC) 06/15/2020  . AKI (acute kidney injury) (HCC) 06/15/2020  . CKD (chronic kidney disease), stage III (HCC) 06/15/2020  . Anemia due to chronic blood loss   . Gastric AVM   . Angiodysplasia of duodenum with hemorrhage   . Chronic systolic CHF (congestive heart failure) (HCC) 04/05/2020  . Anemia 04/05/2020  . PAF (paroxysmal atrial fibrillation) (HCC) 04/05/2020  . OSA (obstructive sleep apnea) 04/05/2020  . Syncope and collapse 04/05/2020  . Type 2 diabetes mellitus with hyperglycemia, with long-term current use of insulin (HCC) 12/03/2019  . Diabetic polyneuropathy associated with type 2 diabetes mellitus (HCC) 12/03/2019  . Erectile dysfunction 12/03/2019  . Paranoid schizophrenia, chronic condition (HCC) 01/26/2015  . Severe recurrent major depressive disorder with psychotic features (HCC) 01/26/2015  . GAD (generalized anxiety disorder) 01/26/2015  . OCD (obsessive compulsive disorder) 01/26/2015  . Panic disorder without agoraphobia 01/26/2015  . Insomnia 01/26/2015    Current Outpatient Medications:  .  acetaminophen (TYLENOL) 500 MG tablet, Take 2 tablets (1,000 mg total) by mouth 2 (two) times daily as needed. (Patient taking differently: Take 1,000 mg by mouth 2 (two) times daily as needed. Takes 1000mg  morning and night.), Disp: 180 tablet, Rfl: 3 .  albuterol (VENTOLIN HFA) 108 (90 Base) MCG/ACT inhaler, Inhale 2 puffs into the lungs every 6 (six) hours as needed for wheezing or shortness of breath., Disp: 18 g, Rfl: 1 .  aspirin EC 81 MG tablet, Take 81 mg by mouth daily. Swallow whole., Disp: , Rfl:  .  atorvastatin (  LIPITOR) 40 MG tablet, Take 1 tablet (40 mg total) by mouth daily., Disp: 90 tablet, Rfl: 3 .  blood glucose meter kit and supplies KIT, Dispense based on patient and insurance preference. Use up to four times daily as directed. (FOR ICD-9 250.00, 250.01)., Disp: 1 each, Rfl: 0 .  colchicine 0.6 MG tablet, Daily  for 7 days then daily PRN for gout flare (Patient not taking: Reported on 10/17/2023), Disp: 30 tablet, Rfl: 0 .  Continuous Glucose Receiver (FREESTYLE LIBRE 3 READER) DEVI, 1 each by Does not apply route daily. Use to check glucose continuously, Disp: 1 each, Rfl: 1 .  Continuous Glucose Sensor (FREESTYLE LIBRE 3 SENSOR) MISC, Place 1 sensor on the skin every 14 days. Use to check glucose continuously, Disp: 6 each, Rfl: 2 .  diclofenac Sodium (VOLTAREN) 1 % GEL, Apply 2 g topically daily as needed for pain., Disp: , Rfl:  .  DULoxetine (CYMBALTA) 30 MG capsule, Take 1 capsule by mouth once daily, Disp: 30 capsule, Rfl: 2 .  empagliflozin (JARDIANCE) 10 MG TABS tablet, TAKE 1 TABLET BY MOUTH ONCE DAILY ., Disp: 90 tablet, Rfl: 3 .  ENTRESTO 24-26 MG, TAKE 1 TABLET BY MOUTH TWICE DAILY *FOLLOW UP APPOINTMENT FOR MORE REFILLS*, Disp: 180 tablet, Rfl: 0 .  escitalopram (LEXAPRO) 10 MG tablet, Take 10 mg by mouth daily. Prescribed by Dr. Elvera Lennox. Dimkpa, Disp: , Rfl:  .  ferrous sulfate 325 (65 FE) MG tablet, Take 1 tablet (325 mg total) by mouth every other day., Disp: 30 tablet, Rfl: 1 .  furosemide (LASIX) 40 MG tablet, Take 1.5 tablets (60 mg total) by mouth 2 (two) times daily., Disp: 200 tablet, Rfl: 5 .  gabapentin (NEURONTIN) 800 MG tablet, Take 1 tablet (800 mg total) by mouth 3 (three) times daily., Disp: 90 tablet, Rfl: 5 .  HUMALOG MIX 75/25 KWIKPEN (75-25) 100 UNIT/ML KwikPen, DIAL AND INJECT 30 UNITS UNDER THE SKIN TWICE DAILY, Disp: 75 mL, Rfl: 0 .  metFORMIN (GLUCOPHAGE-XR) 500 MG 24 hr tablet, Take 1 tablet (500 mg total) by mouth 2 (two) times daily with a meal., Disp: 180 tablet, Rfl: 1 .  metoprolol succinate (TOPROL-XL) 50 MG 24 hr tablet, Take 1 tablet (50 mg total) by mouth daily., Disp: 180 tablet, Rfl: 3 .  Multiple Vitamins-Minerals (CENTRUM SILVER 50+MEN) TABS, Take 1 tablet by mouth daily., Disp: , Rfl:  .  Omega-3 Fatty Acids (FISH OIL) 1000 MG CAPS, Take 1,000 mg by mouth at  bedtime., Disp: , Rfl:  .  pantoprazole (PROTONIX) 40 MG tablet, Take 1 tablet by mouth once daily, Disp: 90 tablet, Rfl: 0 .  potassium chloride SA (KLOR-CON M) 20 MEQ tablet, Take 2 tablets (40 mEq total) by mouth daily., Disp: 60 tablet, Rfl: 6 .  QUEtiapine (SEROQUEL) 300 MG tablet, Take 1.5 tablets (450 mg total) by mouth at bedtime. (Patient not taking: Reported on 10/17/2023), Disp: 135 tablet, Rfl: 1 .  RELION PEN NEEDLES 31G X 6 MM MISC, INJECT 1 PEN INTO THE SKIN 2 TIMES DAILY, Disp: 100 each, Rfl: 0 .  Semaglutide, 2 MG/DOSE, (OZEMPIC, 2 MG/DOSE,) 8 MG/3ML SOPN, Inject 2 mg into the skin once a week. Monday, Disp: , Rfl:  .  sildenafil (VIAGRA) 100 MG tablet, TAKE 1/2 TO 1 (ONE-HALF TO ONE) TABLET BY MOUTH ONCE DAILY AS NEEDED FOR ERECTILE DYSFUNCTION, Disp: 25 tablet, Rfl: 0 .  spironolactone (ALDACTONE) 25 MG tablet, Take 1 tablet (25 mg total) by mouth daily., Disp: 90  tablet, Rfl: 3 .  tadalafil (CIALIS) 20 MG tablet, Take 20 mg by mouth daily as needed., Disp: , Rfl:  .  Vitamin D, Ergocalciferol, (DRISDOL) 1.25 MG (50000 UNIT) CAPS capsule, Take 1 capsule by mouth once a week, Disp: 5 capsule, Rfl: 0 .  zinc gluconate 50 MG tablet, Take 50 mg by mouth daily. (Patient not taking: Reported on 10/17/2023), Disp: , Rfl:  No Known Allergies   Social History   Socioeconomic History  . Marital status: Legally Separated    Spouse name: Not on file  . Number of children: Not on file  . Years of education: Not on file  . Highest education level: Not on file  Occupational History  . Not on file  Tobacco Use  . Smoking status: Never    Passive exposure: Never  . Smokeless tobacco: Never  Vaping Use  . Vaping status: Never Used  Substance and Sexual Activity  . Alcohol use: Not Currently    Alcohol/week: 2.0 standard drinks of alcohol    Types: 2 Cans of beer per week  . Drug use: No  . Sexual activity: Yes    Birth control/protection: None  Other Topics Concern  . Not on  file  Social History Narrative  . Not on file   Social Determinants of Health   Financial Resource Strain: Medium Risk (05/24/2023)   Overall Financial Resource Strain (CARDIA)   . Difficulty of Paying Living Expenses: Somewhat hard  Food Insecurity: Food Insecurity Present (06/07/2023)   Hunger Vital Sign   . Worried About Programme researcher, broadcasting/film/video in the Last Year: Sometimes true   . Ran Out of Food in the Last Year: Sometimes true  Transportation Needs: No Transportation Needs (05/24/2023)   PRAPARE - Transportation   . Lack of Transportation (Medical): No   . Lack of Transportation (Non-Medical): No  Physical Activity: Sufficiently Active (05/24/2023)   Exercise Vital Sign   . Days of Exercise per Week: 7 days   . Minutes of Exercise per Session: 30 min  Stress: No Stress Concern Present (05/24/2023)   [employer] of Occupational Health - Occupational Stress Questionnaire   . Feeling of Stress : Not at all  Social Connections: Moderately Isolated (05/24/2023)   Social Connection and Isolation Panel [NHANES]   . Frequency of Communication with Friends and Family: More than three times a week   . Frequency of Social Gatherings with Friends and Family: More than three times a week   . Attends Religious Services: Never   . Active Member of Clubs or Organizations: No   . Attends Banker Meetings: Never   . Marital Status: Living with partner  Intimate Partner Violence: Not At Risk (05/24/2023)   Humiliation, Afraid, Rape, and Kick questionnaire   . Fear of Current or Ex-Partner: No   . Emotionally Abused: No   . Physically Abused: No   . Sexually Abused: No    Physical Exam      Future Appointments  Date Time Provider Department Center  11/01/2023  3:00 PM [facility] ECHO OP 1 [facility]  11/23/2023  1:40 PM Ivonne Andrew, NP [facility] None  12/04/2023  7:40 AM [facility] DEVICE REMOTES [facility]  03/05/2024  7:40 AM [facility] DEVICE REMOTES  [facility]  05/29/2024  1:00 PM [facility]-ANNUAL WELLNESS VISIT [facility] None  06/05/2024  7:40 AM [facility] DEVICE REMOTES [facility]  09/05/2024  7:40 AM [facility] DEVICE REMOTES [facility]

## 2023-10-30 ENCOUNTER — Other Ambulatory Visit (HOSPITAL_COMMUNITY): Payer: Self-pay | Admitting: Emergency Medicine

## 2023-10-31 NOTE — Progress Notes (Signed)
Medication reconciliation for pt after retrieving refills from pharmacy.   Will follow up next week.   [name] EMT-P Community Paramedic  [phone]

## 2023-11-01 ENCOUNTER — Ambulatory Visit (HOSPITAL_COMMUNITY): Payer: Medicare Other

## 2023-11-05 NOTE — Addendum Note (Signed)
Encounter addended by: Crissie Figures, RN on: 11/05/2023 9:12 AM  Actions taken: Imaging Exam ended

## 2023-11-07 ENCOUNTER — Other Ambulatory Visit: Payer: Self-pay | Admitting: Cardiology

## 2023-11-07 ENCOUNTER — Telehealth (HOSPITAL_COMMUNITY): Payer: Self-pay

## 2023-11-07 ENCOUNTER — Other Ambulatory Visit: Payer: Self-pay | Admitting: Physical Medicine & Rehabilitation

## 2023-11-07 ENCOUNTER — Other Ambulatory Visit (HOSPITAL_COMMUNITY): Payer: Self-pay | Admitting: Emergency Medicine

## 2023-11-07 ENCOUNTER — Telehealth (HOSPITAL_COMMUNITY): Payer: Self-pay | Admitting: Emergency Medicine

## 2023-11-07 MED ORDER — METOPROLOL SUCCINATE ER 50 MG PO TB24: 75.0000 mg | ORAL_TABLET | Freq: Every day | ORAL | 11 refills | Status: DC

## 2023-11-07 NOTE — Telephone Encounter (Signed)
-----   Message from Marca Ancona sent at 11/06/2023  7:49 AM EST ----- Short NSVT run.  4% PVCs.   Use CPAP, increase Toprol XL to 75 mg daily.

## 2023-11-07 NOTE — Telephone Encounter (Signed)
Called [name] this morning at 812 to confirm his home visit. No answer and unable to leave a VM.   I arrived at his house under the standing agreement we made last week that if I were unable to get in touch with him, he promised Wednesdays at 9 he would be at home and ready for our home visit. Upon my arrival, pt door was closed and his friend was waiting outside. Friend advised that he had been knocking all morning without answer. I rang Ring door bell then knocked multiple times. Pt did not come to the door. I called again with no answer and unable to leave a VM.   I advised friend to have [name] call [name] when he found him.   Will continue to try to reach out.   [name] EMT-P Community Paramedic  [phone]

## 2023-11-07 NOTE — Telephone Encounter (Signed)
A staff message was also sent to [name] with Paramedicine. In addition, pt's Toprol XL medication hs been updated and changed in pt's chart medication also sent to his pharmacy. Pt aware, agreeable, and verbalized understanding.

## 2023-11-07 NOTE — Progress Notes (Signed)
Paramedicine Encounter    Patient ID: [name], male    DOB: 03/24/1961, 62 y.o.   MRN: [id number]   Complaints - none  Assessment - Lung sounds clear, no pedal edema noted.   Compliance with meds - missed 2 doses of furosemide and 1 evening dose  Pill box filled - for 1x week , no duloxetine to put in box  Refills needed - jardiance, escitalopram, iron, metformin, potassium, duloxetine, zinc, metoprolol   Meds changes since last visit - increased to 75 mg Metoprolol, reflected in pill box    Social changes - none   VISIT SUMMARY**  I met with [name] in his home. Pt reported that he has been feeling well. Pt denied any shortness of breath, chest pain, palpitations, or dizziness. Pt reported that he missed his echo appointment and rescheduled for the 17th. Pt reported receiving a call from the clinic reference his ZIO patch, but couldn't reiterate information. I explained to him the medication change and the reason why. Pill box updated with Metoprolol increase. Pt's pill box was filled for 1x week. Will follow up in 1x week.   BP (!) 162/94   Pulse 82   Resp 18   Wt 284 lb 3.2 oz (128.9 kg)   SpO2 96%   BMI 36.49 kg/m  Weight yesterday-DNW Last visit weight-284 lbs   [name] EMT-P Community Paramedic  [phone]     ACTION: Home visit completed     Patient Care Team: Ivonne Andrew, NP as PCP - General (Pulmonary Disease) Quintella Reichert, MD as PCP - Cardiology (Cardiology) Laurey Morale, MD as PCP - Advanced Heart Failure (Cardiology) Lanier Prude, MD as PCP - Electrophysiology (Cardiology) Burna Sis, LCSW as Social Worker (Licensed Clinical Social Worker) Tressia Danas, MD (Inactive) as Consulting Physician (Gastroenterology)  Patient Active Problem List   Diagnosis Date Noted   PVC's (premature ventricular contractions) 09/28/2023   Acute right-sided low back pain with right-sided sciatica 05/14/2023   Multiple joint  pain 12/22/2022   Obesity (BMI 35.0-39.9 without comorbidity) 07/18/2022   Cellulitis 06/22/2022   Cellulitis, leg 06/20/2022   Presence of Watchman left atrial appendage closure device 10/28/2021   Atrial fibrillation (HCC) 10/27/2021   Melena    Chronic diastolic CHF (congestive heart failure) (HCC)    GI bleed 06/29/2021   Severe anemia 06/29/2021   Adenomatous polyp of ascending colon    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   Acute blood loss anemia    Angiodysplasia of stomach    Chronic anticoagulation    CHF exacerbation (HCC) 06/15/2020   AKI (acute kidney injury) (HCC) 06/15/2020   CKD (chronic kidney disease), stage III (HCC) 06/15/2020   Anemia due to chronic blood loss    Gastric AVM    Angiodysplasia of duodenum with hemorrhage    Chronic systolic CHF (congestive heart failure) (HCC) 04/05/2020   Anemia 04/05/2020   PAF (paroxysmal atrial fibrillation) (HCC) 04/05/2020   OSA (obstructive sleep apnea) 04/05/2020   Syncope and collapse 04/05/2020   Type 2 diabetes mellitus with hyperglycemia, with long-term current use of insulin (HCC) 12/03/2019   Diabetic polyneuropathy associated with type 2 diabetes mellitus (HCC) 12/03/2019   Erectile dysfunction 12/03/2019   Paranoid schizophrenia, chronic condition (HCC) 01/26/2015   Severe recurrent major depressive disorder with psychotic features (HCC) 01/26/2015   GAD (generalized anxiety disorder) 01/26/2015   OCD (obsessive compulsive disorder) 01/26/2015   Panic disorder without agoraphobia 01/26/2015   Insomnia 01/26/2015  Current Outpatient Medications:    acetaminophen (TYLENOL) 500 MG tablet, Take 2 tablets (1,000 mg total) by mouth 2 (two) times daily as needed. (Patient taking differently: Take 1,000 mg by mouth 2 (two) times daily as needed. Takes 1000mg  morning and night.), Disp: 180 tablet, Rfl: 3   albuterol (VENTOLIN HFA) 108 (90 Base) MCG/ACT inhaler, Inhale 2 puffs into the  lungs every 6 (six) hours as needed for wheezing or shortness of breath., Disp: 18 g, Rfl: 1   aspirin EC 81 MG tablet, Take 81 mg by mouth daily. Swallow whole., Disp: , Rfl:    atorvastatin (LIPITOR) 40 MG tablet, Take 1 tablet (40 mg total) by mouth daily., Disp: 90 tablet, Rfl: 3   blood glucose meter kit and supplies KIT, Dispense based on patient and insurance preference. Use up to four times daily as directed. (FOR ICD-9 250.00, 250.01)., Disp: 1 each, Rfl: 0   Continuous Glucose Receiver (FREESTYLE LIBRE 3 READER) DEVI, 1 each by Does not apply route daily. Use to check glucose continuously, Disp: 1 each, Rfl: 1   Continuous Glucose Sensor (FREESTYLE LIBRE 3 SENSOR) MISC, Place 1 sensor on the skin every 14 days. Use to check glucose continuously, Disp: 6 each, Rfl: 2   diclofenac Sodium (VOLTAREN) 1 % GEL, Apply 2 g topically daily as needed for pain., Disp: , Rfl:    empagliflozin (JARDIANCE) 10 MG TABS tablet, TAKE 1 TABLET BY MOUTH ONCE DAILY ., Disp: 90 tablet, Rfl: 3   ENTRESTO 24-26 MG, TAKE 1 TABLET BY MOUTH TWICE DAILY *FOLLOW UP APPOINTMENT FOR MORE REFILLS*, Disp: 180 tablet, Rfl: 0   escitalopram (LEXAPRO) 10 MG tablet, Take 10 mg by mouth daily. Prescribed by Dr. Elvera Lennox. Dimkpa, Disp: , Rfl:    ferrous sulfate 325 (65 FE) MG tablet, Take 1 tablet (325 mg total) by mouth every other day., Disp: 30 tablet, Rfl: 1   furosemide (LASIX) 40 MG tablet, Take 1.5 tablets (60 mg total) by mouth 2 (two) times daily., Disp: 200 tablet, Rfl: 5   gabapentin (NEURONTIN) 800 MG tablet, Take 1 tablet (800 mg total) by mouth 3 (three) times daily., Disp: 90 tablet, Rfl: 5   HUMALOG MIX 75/25 KWIKPEN (75-25) 100 UNIT/ML KwikPen, DIAL AND INJECT 30 UNITS UNDER THE SKIN TWICE DAILY, Disp: 75 mL, Rfl: 0   metFORMIN (GLUCOPHAGE-XR) 500 MG 24 hr tablet, Take 1 tablet (500 mg total) by mouth 2 (two) times daily with a meal., Disp: 180 tablet, Rfl: 1   metoprolol succinate (TOPROL-XL) 50 MG 24 hr tablet, Take  1.5 tablets (75 mg total) by mouth daily., Disp: 45 tablet, Rfl: 11   Multiple Vitamins-Minerals (CENTRUM SILVER 50+MEN) TABS, Take 1 tablet by mouth daily., Disp: , Rfl:    Omega-3 Fatty Acids (FISH OIL) 1000 MG CAPS, Take 1,000 mg by mouth at bedtime., Disp: , Rfl:    pantoprazole (PROTONIX) 40 MG tablet, Take 1 tablet by mouth once daily, Disp: 90 tablet, Rfl: 0   potassium chloride SA (KLOR-CON M) 20 MEQ tablet, Take 2 tablets (40 mEq total) by mouth daily., Disp: 60 tablet, Rfl: 6   QUEtiapine (SEROQUEL) 300 MG tablet, Take 1.5 tablets (450 mg total) by mouth at bedtime., Disp: 135 tablet, Rfl: 1   RELION PEN NEEDLES 31G X 6 MM MISC, INJECT 1 PEN INTO THE SKIN 2 TIMES DAILY, Disp: 100 each, Rfl: 0   Semaglutide, 2 MG/DOSE, (OZEMPIC, 2 MG/DOSE,) 8 MG/3ML SOPN, Inject 2 mg into the skin once a week.  Monday, Disp: , Rfl:    sildenafil (VIAGRA) 100 MG tablet, TAKE 1/2 TO 1 (ONE-HALF TO ONE) TABLET BY MOUTH ONCE DAILY AS NEEDED FOR ERECTILE DYSFUNCTION, Disp: 25 tablet, Rfl: 0   spironolactone (ALDACTONE) 25 MG tablet, Take 1 tablet (25 mg total) by mouth daily., Disp: 90 tablet, Rfl: 3   tadalafil (CIALIS) 20 MG tablet, Take 20 mg by mouth daily as needed., Disp: , Rfl:    Vitamin D, Ergocalciferol, (DRISDOL) 1.25 MG (50000 UNIT) CAPS capsule, Take 1 capsule by mouth once a week, Disp: 5 capsule, Rfl: 0   zinc gluconate 50 MG tablet, Take 50 mg by mouth daily., Disp: , Rfl:    colchicine 0.6 MG tablet, Daily for 7 days then daily PRN for gout flare (Patient not taking: Reported on 10/17/2023), Disp: 30 tablet, Rfl: 0   DULoxetine (CYMBALTA) 30 MG capsule, Take 1 capsule by mouth once daily, Disp: 30 capsule, Rfl: 2 No Known Allergies   Social History   Socioeconomic History   Marital status: Legally Separated    Spouse name: Not on file   Number of children: Not on file   Years of education: Not on file   Highest education level: Not on file  Occupational History   Not on file  Tobacco  Use   Smoking status: Never    Passive exposure: Never   Smokeless tobacco: Never  Vaping Use   Vaping status: Never Used  Substance and Sexual Activity   Alcohol use: Not Currently    Alcohol/week: 2.0 standard drinks of alcohol    Types: 2 Cans of beer per week   Drug use: No   Sexual activity: Yes    Birth control/protection: None  Other Topics Concern   Not on file  Social History Narrative   Not on file   Social Determinants of Health   Financial Resource Strain: Medium Risk (05/24/2023)   Overall Financial Resource Strain (CARDIA)    Difficulty of Paying Living Expenses: Somewhat hard  Food Insecurity: Food Insecurity Present (06/07/2023)   Hunger Vital Sign    Worried About Running Out of Food in the Last Year: Sometimes true    Ran Out of Food in the Last Year: Sometimes true  Transportation Needs: No Transportation Needs (05/24/2023)   PRAPARE - Administrator, Civil Service (Medical): No    Lack of Transportation (Non-Medical): No  Physical Activity: Sufficiently Active (05/24/2023)   Exercise Vital Sign    Days of Exercise per Week: 7 days    Minutes of Exercise per Session: 30 min  Stress: No Stress Concern Present (05/24/2023)   [employer] of Occupational Health - Occupational Stress Questionnaire    Feeling of Stress : Not at all  Social Connections: Moderately Isolated (05/24/2023)   Social Connection and Isolation Panel [NHANES]    Frequency of Communication with Friends and Family: More than three times a week    Frequency of Social Gatherings with Friends and Family: More than three times a week    Attends Religious Services: Never    Database administrator or Organizations: No    Attends Banker Meetings: Never    Marital Status: Living with partner  Intimate Partner Violence: Not At Risk (05/24/2023)   Humiliation, Afraid, Rape, and Kick questionnaire    Fear of Current or Ex-Partner: No    Emotionally Abused: No     Physically Abused: No    Sexually Abused: No    Physical Exam  Future Appointments  Date Time Provider Department Center  11/13/2023  3:00 PM [facility]  11/23/2023  1:40 PM Ivonne Andrew, NP [facility] None  12/04/2023  7:40 AM [facility] DEVICE REMOTES [facility]  03/05/2024  7:40 AM [facility] DEVICE REMOTES [facility]  05/29/2024  1:00 PM [facility]-ANNUAL WELLNESS VISIT [facility] None  06/05/2024  7:40 AM [facility] DEVICE REMOTES [facility]  09/05/2024  7:40 AM [facility] DEVICE REMOTES [facility]

## 2023-11-09 ENCOUNTER — Other Ambulatory Visit: Payer: Self-pay | Admitting: Nurse Practitioner

## 2023-11-09 DIAGNOSIS — R7309 Other abnormal glucose: Secondary | ICD-10-CM

## 2023-11-09 DIAGNOSIS — E1165 Type 2 diabetes mellitus with hyperglycemia: Secondary | ICD-10-CM

## 2023-11-13 ENCOUNTER — Ambulatory Visit (HOSPITAL_COMMUNITY): Admission: RE | Admit: 2023-11-13 | Payer: Medicare Other | Source: Ambulatory Visit

## 2023-11-14 ENCOUNTER — Other Ambulatory Visit: Payer: Self-pay | Admitting: Nurse Practitioner

## 2023-11-14 ENCOUNTER — Other Ambulatory Visit: Payer: Self-pay | Admitting: Physical Medicine & Rehabilitation

## 2023-11-14 ENCOUNTER — Other Ambulatory Visit (HOSPITAL_COMMUNITY): Payer: Self-pay | Admitting: Emergency Medicine

## 2023-11-14 DIAGNOSIS — G6289 Other specified polyneuropathies: Secondary | ICD-10-CM

## 2023-11-14 DIAGNOSIS — E1165 Type 2 diabetes mellitus with hyperglycemia: Secondary | ICD-10-CM

## 2023-11-14 NOTE — Progress Notes (Signed)
Paramedicine Encounter    Patient ID: [name], male    DOB: August 29, 1961, 62 y.o.   MRN: [id number]   Complaints - "Coming down with a cold"  Assessment - llung sounds clear. No edema noted  Compliance with meds - no missed doses noted  Pill box filled - for 2x weeks   Refills needed - jardiance, Escatalopra, furosemide, gabapentin, metformin, potassium, vit D.   Meds changes since last visit - increase Metoprolol - no side effects from same.     Social changes - none   VISIT SUMMARY**  I met with [name] in his home. Pt reported that he felt like he was coming down with a cold. Pt reported using OTC medications to treat same. Pt denied any heart related symptoms at this time. Pt was noted to have missed his ECHO appointment and attributed it to being sick and forgetting. Pt was advised to reschedule his appointment. Pt was assessed, no significant changes noted. Pt's pill box was filled for 2x weeks. Upcoming appointments were reviewed. Will follow up in 2x weeks.   BP (!) 140/80   Pulse (!) 52   Resp 16   Wt 277 lb (125.6 kg)   SpO2 96%   BMI 35.56 kg/m  Weight yesterday-DNW Last visit weight-284 lbs    [name] EMT-P Community Paramedic  [phone]    ACTION: Home visit completed     Patient Care Team: Ivonne Andrew, NP as PCP - General (Pulmonary Disease) Quintella Reichert, MD as PCP - Cardiology (Cardiology) Laurey Morale, MD as PCP - Advanced Heart Failure (Cardiology) Lanier Prude, MD as PCP - Electrophysiology (Cardiology) Burna Sis, LCSW as Social Worker (Licensed Clinical Social Worker) Tressia Danas, MD (Inactive) as Consulting Physician (Gastroenterology)  Patient Active Problem List   Diagnosis Date Noted   PVC's (premature ventricular contractions) 09/28/2023   Acute right-sided low back pain with right-sided sciatica 05/14/2023   Multiple joint pain 12/22/2022   Obesity (BMI 35.0-39.9 without comorbidity)  07/18/2022   Cellulitis 06/22/2022   Cellulitis, leg 06/20/2022   Presence of Watchman left atrial appendage closure device 10/28/2021   Atrial fibrillation (HCC) 10/27/2021   Melena    Chronic diastolic CHF (congestive heart failure) (HCC)    GI bleed 06/29/2021   Severe anemia 06/29/2021   Adenomatous polyp of ascending colon    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   Acute blood loss anemia    Angiodysplasia of stomach    Chronic anticoagulation    CHF exacerbation (HCC) 06/15/2020   AKI (acute kidney injury) (HCC) 06/15/2020   CKD (chronic kidney disease), stage III (HCC) 06/15/2020   Anemia due to chronic blood loss    Gastric AVM    Angiodysplasia of duodenum with hemorrhage    Chronic systolic CHF (congestive heart failure) (HCC) 04/05/2020   Anemia 04/05/2020   PAF (paroxysmal atrial fibrillation) (HCC) 04/05/2020   OSA (obstructive sleep apnea) 04/05/2020   Syncope and collapse 04/05/2020   Type 2 diabetes mellitus with hyperglycemia, with long-term current use of insulin (HCC) 12/03/2019   Diabetic polyneuropathy associated with type 2 diabetes mellitus (HCC) 12/03/2019   Erectile dysfunction 12/03/2019   Paranoid schizophrenia, chronic condition (HCC) 01/26/2015   Severe recurrent major depressive disorder with psychotic features (HCC) 01/26/2015   GAD (generalized anxiety disorder) 01/26/2015   OCD (obsessive compulsive disorder) 01/26/2015   Panic disorder without agoraphobia 01/26/2015   Insomnia 01/26/2015    Current Outpatient Medications:    acetaminophen (  TYLENOL) 500 MG tablet, Take 2 tablets (1,000 mg total) by mouth 2 (two) times daily as needed. (Patient taking differently: Take 1,000 mg by mouth 2 (two) times daily as needed. Takes 1000mg  morning and night.), Disp: 180 tablet, Rfl: 3   albuterol (VENTOLIN HFA) 108 (90 Base) MCG/ACT inhaler, Inhale 2 puffs into the lungs every 6 (six) hours as needed for wheezing or shortness of  breath., Disp: 18 g, Rfl: 1   aspirin EC 81 MG tablet, Take 81 mg by mouth daily. Swallow whole., Disp: , Rfl:    atorvastatin (LIPITOR) 40 MG tablet, Take 1 tablet (40 mg total) by mouth daily., Disp: 90 tablet, Rfl: 3   blood glucose meter kit and supplies KIT, Dispense based on patient and insurance preference. Use up to four times daily as directed. (FOR ICD-9 250.00, 250.01)., Disp: 1 each, Rfl: 0   Continuous Glucose Receiver (FREESTYLE LIBRE 3 READER) DEVI, 1 each by Does not apply route daily. Use to check glucose continuously, Disp: 1 each, Rfl: 1   Continuous Glucose Sensor (FREESTYLE LIBRE 3 SENSOR) MISC, Place 1 sensor on the skin every 14 days. Use to check glucose continuously, Disp: 6 each, Rfl: 2   diclofenac Sodium (VOLTAREN) 1 % GEL, Apply 2 g topically daily as needed for pain., Disp: , Rfl:    empagliflozin (JARDIANCE) 10 MG TABS tablet, TAKE 1 TABLET BY MOUTH ONCE DAILY ., Disp: 90 tablet, Rfl: 3   ENTRESTO 24-26 MG, TAKE 1 TABLET BY MOUTH TWICE DAILY *FOLLOW UP APPOINTMENT FOR MORE REFILLS*, Disp: 180 tablet, Rfl: 0   escitalopram (LEXAPRO) 10 MG tablet, Take 10 mg by mouth daily. Prescribed by Dr. Elvera Lennox. Dimkpa, Disp: , Rfl:    ferrous sulfate 325 (65 FE) MG tablet, Take 1 tablet (325 mg total) by mouth every other day., Disp: 30 tablet, Rfl: 1   furosemide (LASIX) 40 MG tablet, Take 1.5 tablets (60 mg total) by mouth 2 (two) times daily., Disp: 200 tablet, Rfl: 5   gabapentin (NEURONTIN) 800 MG tablet, Take 1 tablet (800 mg total) by mouth 3 (three) times daily., Disp: 90 tablet, Rfl: 5   HUMALOG MIX 75/25 KWIKPEN (75-25) 100 UNIT/ML KwikPen, DIAL 30 UNITS AND INJECT UNDER THE SKIN TWICE DAILY. MAX DAILY DOSE: 60 UNITS., Disp: 60 mL, Rfl: 0   metFORMIN (GLUCOPHAGE-XR) 500 MG 24 hr tablet, Take 1 tablet (500 mg total) by mouth 2 (two) times daily with a meal., Disp: 180 tablet, Rfl: 1   metoprolol succinate (TOPROL-XL) 50 MG 24 hr tablet, Take 1.5 tablets (75 mg total) by mouth  daily., Disp: 45 tablet, Rfl: 11   Multiple Vitamins-Minerals (CENTRUM SILVER 50+MEN) TABS, Take 1 tablet by mouth daily., Disp: , Rfl:    Omega-3 Fatty Acids (FISH OIL) 1000 MG CAPS, Take 1,000 mg by mouth at bedtime., Disp: , Rfl:    pantoprazole (PROTONIX) 40 MG tablet, Take 1 tablet by mouth once daily, Disp: 90 tablet, Rfl: 0   potassium chloride SA (KLOR-CON M) 20 MEQ tablet, Take 2 tablets (40 mEq total) by mouth daily., Disp: 60 tablet, Rfl: 6   QUEtiapine (SEROQUEL) 300 MG tablet, Take 1.5 tablets (450 mg total) by mouth at bedtime., Disp: 135 tablet, Rfl: 1   Semaglutide, 2 MG/DOSE, (OZEMPIC, 2 MG/DOSE,) 8 MG/3ML SOPN, Inject 2 mg into the skin once a week. Monday, Disp: , Rfl:    sildenafil (VIAGRA) 100 MG tablet, TAKE 1/2 TO 1 (ONE-HALF TO ONE) TABLET BY MOUTH ONCE DAILY AS NEEDED  FOR ERECTILE DYSFUNCTION, Disp: 25 tablet, Rfl: 0   tadalafil (CIALIS) 20 MG tablet, Take 20 mg by mouth daily as needed., Disp: , Rfl:    Vitamin D, Ergocalciferol, (DRISDOL) 1.25 MG (50000 UNIT) CAPS capsule, Take 1 capsule by mouth once a week, Disp: 5 capsule, Rfl: 0   colchicine 0.6 MG tablet, Daily for 7 days then daily PRN for gout flare (Patient not taking: Reported on 11/14/2023), Disp: 30 tablet, Rfl: 0   DULoxetine (CYMBALTA) 30 MG capsule, Take 1 capsule by mouth once daily (Patient not taking: Reported on 11/14/2023), Disp: 30 capsule, Rfl: 2   RELION PEN NEEDLES 31G X 6 MM MISC, INJECT 1 PEN INTO THE SKIN 2 TIMES DAILY, Disp: 100 each, Rfl: 0   spironolactone (ALDACTONE) 25 MG tablet, Take 1 tablet (25 mg total) by mouth daily., Disp: 90 tablet, Rfl: 3   zinc gluconate 50 MG tablet, Take 50 mg by mouth daily. (Patient not taking: Reported on 11/14/2023), Disp: , Rfl:  No Known Allergies   Social History   Socioeconomic History   Marital status: Legally Separated    Spouse name: Not on file   Number of children: Not on file   Years of education: Not on file   Highest education level: Not on  file  Occupational History   Not on file  Tobacco Use   Smoking status: Never    Passive exposure: Never   Smokeless tobacco: Never  Vaping Use   Vaping status: Never Used  Substance and Sexual Activity   Alcohol use: Not Currently    Alcohol/week: 2.0 standard drinks of alcohol    Types: 2 Cans of beer per week   Drug use: No   Sexual activity: Yes    Birth control/protection: None  Other Topics Concern   Not on file  Social History Narrative   Not on file   Social Drivers of Health   Financial Resource Strain: Medium Risk (05/24/2023)   Overall Financial Resource Strain (CARDIA)    Difficulty of Paying Living Expenses: Somewhat hard  Food Insecurity: Food Insecurity Present (06/07/2023)   Hunger Vital Sign    Worried About Running Out of Food in the Last Year: Sometimes true    Ran Out of Food in the Last Year: Sometimes true  Transportation Needs: No Transportation Needs (05/24/2023)   PRAPARE - Administrator, Civil Service (Medical): No    Lack of Transportation (Non-Medical): No  Physical Activity: Sufficiently Active (05/24/2023)   Exercise Vital Sign    Days of Exercise per Week: 7 days    Minutes of Exercise per Session: 30 min  Stress: No Stress Concern Present (05/24/2023)   [employer] of Occupational Health - Occupational Stress Questionnaire    Feeling of Stress : Not at all  Social Connections: Moderately Isolated (05/24/2023)   Social Connection and Isolation Panel [NHANES]    Frequency of Communication with Friends and Family: More than three times a week    Frequency of Social Gatherings with Friends and Family: More than three times a week    Attends Religious Services: Never    Database administrator or Organizations: No    Attends Banker Meetings: Never    Marital Status: Living with partner  Intimate Partner Violence: Not At Risk (05/24/2023)   Humiliation, Afraid, Rape, and Kick questionnaire    Fear of Current or  Ex-Partner: No    Emotionally Abused: No    Physically Abused: No  Sexually Abused: No    Physical Exam      Future Appointments  Date Time Provider Department Center  11/23/2023  1:40 PM Ivonne Andrew, NP [facility] None  12/04/2023  7:40 AM [facility] DEVICE REMOTES [facility]  03/05/2024  7:40 AM [facility] DEVICE REMOTES [facility]  05/29/2024  1:00 PM [facility]-ANNUAL WELLNESS VISIT [facility] None  06/05/2024  7:40 AM [facility] DEVICE REMOTES [facility]  09/05/2024  7:40 AM [facility] DEVICE REMOTES [facility]

## 2023-11-15 ENCOUNTER — Other Ambulatory Visit: Payer: Self-pay | Admitting: Physical Medicine & Rehabilitation

## 2023-11-23 ENCOUNTER — Ambulatory Visit: Payer: Self-pay | Admitting: Nurse Practitioner

## 2023-11-29 ENCOUNTER — Telehealth (HOSPITAL_COMMUNITY): Payer: Self-pay

## 2023-11-29 NOTE — Telephone Encounter (Signed)
 Attempted to reach Mr. Maule in reference to home visit with no success. Left message will continue to reach out.   [name], EMT-Paramedic [phone] 11/29/2023

## 2023-12-05 ENCOUNTER — Other Ambulatory Visit: Payer: Self-pay | Admitting: Physical Medicine & Rehabilitation

## 2023-12-05 ENCOUNTER — Other Ambulatory Visit: Payer: Self-pay | Admitting: Nurse Practitioner

## 2023-12-05 ENCOUNTER — Other Ambulatory Visit (HOSPITAL_COMMUNITY): Payer: Self-pay

## 2023-12-05 DIAGNOSIS — G6289 Other specified polyneuropathies: Secondary | ICD-10-CM

## 2023-12-05 DIAGNOSIS — E1165 Type 2 diabetes mellitus with hyperglycemia: Secondary | ICD-10-CM

## 2023-12-05 NOTE — Progress Notes (Signed)
 Paramedicine Encounter    Patient ID: [name], male    DOB: 1961-11-19, 63 y.o.   MRN: [id number]   Complaints- increased anxiety   Assessment- CAOX4, warm and diaphoretic, no shortness of breath at rest but some on exertion, lungs clear, no swelling- vitals noted hypertension.   Compliance with meds- several missed doses over the last few weeks   Pill box filled- one week   MISSING MEDS: -LEXAPRO  -CYMBALTA   -POTASSIUM -SEROQUEL   -JARDIANCE  (SUN- WEDS)   Refills needed- ALL THE ABOVE PLUS  -GABAPENTIN  -LASIX  -FREESTYLE SENSOR   Meds changes since last visit- NONE     Social changes- Continued non compliance with medications, diet and keeping paramedicine visits.    VISIT SUMMARY**  Arrived for home visit for [name] who reports to be feeling good with no complaints. He was ambulating around the house with some shortness of breath but he denied any difficulty breathing. I obtained vitals as noted- BP noted to be elevated however he has missed several doses of his medications over the last few weeks due to non-compliance. I expressed the importance of med compliance and he verbalized understanding. I reviewed medications and filled pill box. He had several medications he was missing- I was able to contact [facility] and get most refilled. I also rescheduled some appointments he needed to reschedule due to no shows. I will plan to follow up in one week.      There were no vitals taken for this visit. Weight yesterday-- did not weigh  Last visit weight-- 277lbs     ACTION: Home visit completed     Patient Care Team: Oley Bascom RAMAN, NP as PCP - General (Pulmonary Disease) Shlomo Wilbert SAUNDERS, MD as PCP - Cardiology (Cardiology) [name] Ezra RAMAN, MD as PCP - Advanced Heart Failure (Cardiology) Cindie Ole DASEN, MD as PCP - Electrophysiology (Cardiology) Cathern Andriette DEL, LCSW as Social Worker (Licensed Clinical Social Worker) Eda Iha, MD (Inactive)  as Consulting Physician (Gastroenterology)  Patient Active Problem List   Diagnosis Date Noted   PVC's (premature ventricular contractions) 09/28/2023   Acute right-sided low back pain with right-sided sciatica 05/14/2023   Multiple joint pain 12/22/2022   Obesity (BMI 35.0-39.9 without comorbidity) 07/18/2022   Cellulitis 06/22/2022   Cellulitis, leg 06/20/2022   Presence of Watchman left atrial appendage closure device 10/28/2021   Atrial fibrillation (HCC) 10/27/2021   Melena    Chronic diastolic CHF (congestive heart failure) (HCC)    GI bleed 06/29/2021   Severe anemia 06/29/2021   Adenomatous polyp of ascending colon    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   Acute blood loss anemia    Angiodysplasia of stomach    Chronic anticoagulation    CHF exacerbation (HCC) 06/15/2020   AKI (acute kidney injury) (HCC) 06/15/2020   CKD (chronic kidney disease), stage III (HCC) 06/15/2020   Anemia due to chronic blood loss    Gastric AVM    Angiodysplasia of duodenum with hemorrhage    Chronic systolic CHF (congestive heart failure) (HCC) 04/05/2020   Anemia 04/05/2020   PAF (paroxysmal atrial fibrillation) (HCC) 04/05/2020   OSA (obstructive sleep apnea) 04/05/2020   Syncope and collapse 04/05/2020   Type 2 diabetes mellitus with hyperglycemia, with long-term current use of insulin  (HCC) 12/03/2019   Diabetic polyneuropathy associated with type 2 diabetes mellitus (HCC) 12/03/2019   Erectile dysfunction 12/03/2019   Paranoid schizophrenia, chronic condition (HCC) 01/26/2015   Severe recurrent major depressive disorder with psychotic features (HCC) 01/26/2015  GAD (generalized anxiety disorder) 01/26/2015   OCD (obsessive compulsive disorder) 01/26/2015   Panic disorder without agoraphobia 01/26/2015   Insomnia 01/26/2015    Current Outpatient Medications:    acetaminophen  (TYLENOL ) 500 MG tablet, Take 2 tablets (1,000 mg total) by mouth 2 (two) times  daily as needed. (Patient taking differently: Take 1,000 mg by mouth 2 (two) times daily as needed. Takes 1000mg  morning and night.), Disp: 180 tablet, Rfl: 3   albuterol  (VENTOLIN  HFA) 108 (90 Base) MCG/ACT inhaler, Inhale 2 puffs into the lungs every 6 (six) hours as needed for wheezing or shortness of breath., Disp: 18 g, Rfl: 1   aspirin  EC 81 MG tablet, Take 81 mg by mouth daily. Swallow whole., Disp: , Rfl:    atorvastatin  (LIPITOR) 40 MG tablet, Take 1 tablet (40 mg total) by mouth daily., Disp: 90 tablet, Rfl: 3   blood glucose meter kit and supplies KIT, Dispense based on patient and insurance preference. Use up to four times daily as directed. (FOR ICD-9 250.00, 250.01)., Disp: 1 each, Rfl: 0   Continuous Glucose Receiver (FREESTYLE LIBRE 3 READER) DEVI, 1 each by Does not apply route daily. Use to check glucose continuously, Disp: 1 each, Rfl: 1   Continuous Glucose Sensor (FREESTYLE LIBRE 3 SENSOR) MISC, Place 1 sensor on the skin every 14 days. Use to check glucose continuously, Disp: 6 each, Rfl: 2   diclofenac  Sodium (VOLTAREN ) 1 % GEL, Apply 2 g topically daily as needed for pain., Disp: , Rfl:    empagliflozin  (JARDIANCE ) 10 MG TABS tablet, TAKE 1 TABLET BY MOUTH ONCE DAILY ., Disp: 90 tablet, Rfl: 3   ENTRESTO  24-26 MG, TAKE 1 TABLET BY MOUTH TWICE DAILY *FOLLOW UP APPOINTMENT FOR MORE REFILLS*, Disp: 180 tablet, Rfl: 0   escitalopram  (LEXAPRO ) 10 MG tablet, Take 10 mg by mouth daily. Prescribed by Dr. JONELLE. Dimkpa, Disp: , Rfl:    ferrous sulfate  325 (65 FE) MG tablet, Take 1 tablet (325 mg total) by mouth every other day., Disp: 30 tablet, Rfl: 1   furosemide  (LASIX ) 40 MG tablet, Take 1.5 tablets (60 mg total) by mouth 2 (two) times daily., Disp: 200 tablet, Rfl: 5   gabapentin  (NEURONTIN ) 800 MG tablet, Take 1 tablet (800 mg total) by mouth 3 (three) times daily., Disp: 90 tablet, Rfl: 5   metFORMIN  (GLUCOPHAGE -XR) 500 MG 24 hr tablet, Take 1 tablet (500 mg total) by mouth 2 (two)  times daily with a meal., Disp: 180 tablet, Rfl: 1   metoprolol  succinate (TOPROL -XL) 50 MG 24 hr tablet, Take 1.5 tablets (75 mg total) by mouth daily., Disp: 45 tablet, Rfl: 11   Multiple Vitamins-Minerals (CENTRUM SILVER 50+MEN) TABS, Take 1 tablet by mouth daily., Disp: , Rfl:    Omega-3 Fatty Acids (FISH OIL) 1000 MG CAPS, Take 1,000 mg by mouth at bedtime., Disp: , Rfl:    pantoprazole  (PROTONIX ) 40 MG tablet, Take 1 tablet by mouth once daily, Disp: 90 tablet, Rfl: 0   potassium chloride  SA (KLOR-CON  M) 20 MEQ tablet, Take 2 tablets (40 mEq total) by mouth daily., Disp: 60 tablet, Rfl: 6   QUEtiapine  (SEROQUEL ) 300 MG tablet, Take 1.5 tablets (450 mg total) by mouth at bedtime., Disp: 135 tablet, Rfl: 1   RELION PEN NEEDLES 31G X 6 MM MISC, INJECT 1 PEN INTO THE SKIN 2 TIMES DAILY, Disp: 100 each, Rfl: 0   Semaglutide , 2 MG/DOSE, (OZEMPIC , 2 MG/DOSE,) 8 MG/3ML SOPN, Inject 2 mg into the skin once a  week. Monday, Disp: , Rfl:    sildenafil  (VIAGRA ) 100 MG tablet, TAKE 1/2 TO 1 (ONE-HALF TO ONE) TABLET BY MOUTH ONCE DAILY AS NEEDED FOR ERECTILE DYSFUNCTION, Disp: 25 tablet, Rfl: 0   spironolactone  (ALDACTONE ) 25 MG tablet, Take 1 tablet (25 mg total) by mouth daily., Disp: 90 tablet, Rfl: 3   Vitamin D , Ergocalciferol , (DRISDOL ) 1.25 MG (50000 UNIT) CAPS capsule, Take 1 capsule by mouth once a week, Disp: 5 capsule, Rfl: 0   colchicine  0.6 MG tablet, Daily for 7 days then daily PRN for gout flare (Patient not taking: Reported on 10/17/2023), Disp: 30 tablet, Rfl: 0   DULoxetine  (CYMBALTA ) 30 MG capsule, Take 1 capsule by mouth once daily (Patient not taking: Reported on 12/05/2023), Disp: 30 capsule, Rfl: 2   HUMALOG  MIX 75/25 KWIKPEN (75-25) 100 UNIT/ML KwikPen, DIAL 30 UNITS AND INJECT UNDER THE SKIN TWICE DAILY. MAX DAILY DOSE: 60 UNITS., Disp: 60 mL, Rfl: 0   tadalafil  (CIALIS ) 20 MG tablet, Take 20 mg by mouth daily as needed. (Patient not taking: Reported on 12/05/2023), Disp: , Rfl:    zinc  gluconate 50 MG tablet, Take 50 mg by mouth daily. (Patient not taking: Reported on 11/14/2023), Disp: , Rfl:  No Known Allergies   Social History   Socioeconomic History   Marital status: Legally Separated    Spouse name: Not on file   Number of children: Not on file   Years of education: Not on file   Highest education level: Not on file  Occupational History   Not on file  Tobacco Use   Smoking status: Never    Passive exposure: Never   Smokeless tobacco: Never  Vaping Use   Vaping status: Never Used  Substance and Sexual Activity   Alcohol use: Not Currently    Alcohol/week: 2.0 standard drinks of alcohol    Types: 2 Cans of beer per week   Drug use: No   Sexual activity: Yes    Birth control/protection: None  Other Topics Concern   Not on file  Social History Narrative   Not on file   Social Drivers of Health   Financial Resource Strain: Medium Risk (05/24/2023)   Overall Financial Resource Strain (CARDIA)    Difficulty of Paying Living Expenses: Somewhat hard  Food Insecurity: Food Insecurity Present (06/07/2023)   Hunger Vital Sign    Worried About Running Out of Food in the Last Year: Sometimes true    Ran Out of Food in the Last Year: Sometimes true  Transportation Needs: No Transportation Needs (05/24/2023)   PRAPARE - Administrator, Civil Service (Medical): No    Lack of Transportation (Non-Medical): No  Physical Activity: Sufficiently Active (05/24/2023)   Exercise Vital Sign    Days of Exercise per Week: 7 days    Minutes of Exercise per Session: 30 min  Stress: No Stress Concern Present (05/24/2023)   [employer] of Occupational Health - Occupational Stress Questionnaire    Feeling of Stress : Not at all  Social Connections: Moderately Isolated (05/24/2023)   Social Connection and Isolation Panel [NHANES]    Frequency of Communication with Friends and Family: More than three times a week    Frequency of Social Gatherings with Friends  and Family: More than three times a week    Attends Religious Services: Never    Database Administrator or Organizations: No    Attends Banker Meetings: Never    Marital Status: Living with  partner  Intimate Partner Violence: Not At Risk (05/24/2023)   Humiliation, Afraid, Rape, and Kick questionnaire    Fear of Current or Ex-Partner: No    Emotionally Abused: No    Physically Abused: No    Sexually Abused: No    Physical Exam      Future Appointments  Date Time Provider Department Center  12/12/2023  9:40 AM Oley Bascom RAMAN, NP [facility] None  01/01/2024  1:00 PM [facility] ECHO OP 1 [facility]  01/01/2024  2:30 PM [facility] PA/NP [facility] None  03/05/2024  7:40 AM [facility] DEVICE REMOTES [facility]  05/29/2024 12:30 PM [facility]-ANNUAL WELLNESS VISIT [facility] None  06/05/2024  7:40 AM [facility] DEVICE REMOTES [facility]  09/05/2024  7:40 AM [facility] DEVICE REMOTES [facility]

## 2023-12-12 ENCOUNTER — Ambulatory Visit: Payer: Self-pay | Admitting: Nurse Practitioner

## 2023-12-12 ENCOUNTER — Telehealth (HOSPITAL_COMMUNITY): Payer: Self-pay

## 2023-12-12 NOTE — Telephone Encounter (Signed)
 Called [name] to set up home paramedicine visit- no answer. I left a message for him to return my call.   Roberts Ching, EMT-Paramedic [phone] 12/12/2023

## 2023-12-13 ENCOUNTER — Other Ambulatory Visit (HOSPITAL_COMMUNITY): Payer: Self-pay

## 2023-12-13 NOTE — Progress Notes (Signed)
Paramedicine Encounter    Patient ID: [name], male    DOB: 04-13-1961, 63 y.o.   MRN: [id number]   Complaints- Reports high blood sugar reading yesterday in the 400's  Assessment- A&Ox4, diaphoretic but reports he was just cleaning his home prior to my arrival, no lower leg swelling, lungs clear, CBG- 244   Compliance with meds- several missed doses over the last week due to not picking up refills and placing them in pill box as directed.  Pill box filled- for one week   Refills needed- NONE   Meds changes since last visit- NONE     Social changes- NONE    VISIT SUMMARY**  Arrived for home visit for [name] who reports to be feeling okay today but says yesterday he had a high blood sugar episode reaching into the 400's and he felt really bad with nausea, fatigue, headache and upset stomach. He said he ended up taking a metolazone and drinking over 1L of water to "flush" the sugar out. I explained the importance of adhering to his medication regimen and only taking what's prescribed and that the metolazone is intended for as directed not to be taken in the manner he took it. He verbalized understanding. He also missed his PCP and Counseling appointments yesterday. We called to reschedule. I obtained assessment and vitals as noted. We could not apply his freestyle as he misplaced his reader- I obtained manual CBG 244. I reviewed medications and confirmed same filling pill box for one week. No refills needed at present. Education for med compliance and diet discussed. Home visit complete.   BP (!) 150/88   Pulse 71   Resp 16   Wt 282 lb (127.9 kg)   SpO2 99%   BMI 36.21 kg/m  Weight yesterday-- didn't weigh  Last visit weight-- 281lbs      ACTION: Home visit completed     Patient Care Team: Ivonne Andrew, NP as PCP - General (Pulmonary Disease) Quintella Reichert, MD as PCP - Cardiology (Cardiology) Laurey Morale, MD as PCP - Advanced Heart Failure  (Cardiology) Lanier Prude, MD as PCP - Electrophysiology (Cardiology) Burna Sis, LCSW as Social Worker (Licensed Clinical Social Worker) Tressia Danas, MD (Inactive) as Consulting Physician (Gastroenterology)  Patient Active Problem List   Diagnosis Date Noted   PVC's (premature ventricular contractions) 09/28/2023   Acute right-sided low back pain with right-sided sciatica 05/14/2023   Multiple joint pain 12/22/2022   Obesity (BMI 35.0-39.9 without comorbidity) 07/18/2022   Cellulitis 06/22/2022   Cellulitis, leg 06/20/2022   Presence of Watchman left atrial appendage closure device 10/28/2021   Atrial fibrillation (HCC) 10/27/2021   Melena    Chronic diastolic CHF (congestive heart failure) (HCC)    GI bleed 06/29/2021   Severe anemia 06/29/2021   Adenomatous polyp of ascending colon    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   Acute blood loss anemia    Angiodysplasia of stomach    Chronic anticoagulation    CHF exacerbation (HCC) 06/15/2020   AKI (acute kidney injury) (HCC) 06/15/2020   CKD (chronic kidney disease), stage III (HCC) 06/15/2020   Anemia due to chronic blood loss    Gastric AVM    Angiodysplasia of duodenum with hemorrhage    Chronic systolic CHF (congestive heart failure) (HCC) 04/05/2020   Anemia 04/05/2020   PAF (paroxysmal atrial fibrillation) (HCC) 04/05/2020   OSA (obstructive sleep apnea) 04/05/2020   Syncope and collapse 04/05/2020   Type 2  diabetes mellitus with hyperglycemia, with long-term current use of insulin (HCC) 12/03/2019   Diabetic polyneuropathy associated with type 2 diabetes mellitus (HCC) 12/03/2019   Erectile dysfunction 12/03/2019   Paranoid schizophrenia, chronic condition (HCC) 01/26/2015   Severe recurrent major depressive disorder with psychotic features (HCC) 01/26/2015   GAD (generalized anxiety disorder) 01/26/2015   OCD (obsessive compulsive disorder) 01/26/2015   Panic disorder without  agoraphobia 01/26/2015   Insomnia 01/26/2015    Current Outpatient Medications:    acetaminophen (TYLENOL) 500 MG tablet, Take 2 tablets (1,000 mg total) by mouth 2 (two) times daily as needed. (Patient taking differently: Take 1,000 mg by mouth 2 (two) times daily as needed. Takes 1000mg  morning and night.), Disp: 180 tablet, Rfl: 3   albuterol (VENTOLIN HFA) 108 (90 Base) MCG/ACT inhaler, Inhale 2 puffs into the lungs every 6 (six) hours as needed for wheezing or shortness of breath., Disp: 18 g, Rfl: 1   aspirin EC 81 MG tablet, Take 81 mg by mouth daily. Swallow whole., Disp: , Rfl:    atorvastatin (LIPITOR) 40 MG tablet, Take 1 tablet (40 mg total) by mouth daily., Disp: 90 tablet, Rfl: 3   blood glucose meter kit and supplies KIT, Dispense based on patient and insurance preference. Use up to four times daily as directed. (FOR ICD-9 250.00, 250.01)., Disp: 1 each, Rfl: 0   Continuous Glucose Receiver (FREESTYLE LIBRE 3 READER) DEVI, 1 each by Does not apply route daily. Use to check glucose continuously, Disp: 1 each, Rfl: 1   Continuous Glucose Sensor (FREESTYLE LIBRE 3 SENSOR) MISC, Place 1 sensor on the skin every 14 days. Use to check glucose continuously, Disp: 6 each, Rfl: 2   diclofenac Sodium (VOLTAREN) 1 % GEL, Apply 2 g topically daily as needed for pain., Disp: , Rfl:    empagliflozin (JARDIANCE) 10 MG TABS tablet, TAKE 1 TABLET BY MOUTH ONCE DAILY ., Disp: 90 tablet, Rfl: 3   ENTRESTO 24-26 MG, TAKE 1 TABLET BY MOUTH TWICE DAILY *FOLLOW UP APPOINTMENT FOR MORE REFILLS*, Disp: 180 tablet, Rfl: 0   escitalopram (LEXAPRO) 10 MG tablet, Take 10 mg by mouth daily. Prescribed by Dr. Elvera Lennox. Dimkpa, Disp: , Rfl:    ferrous sulfate 325 (65 FE) MG tablet, Take 1 tablet (325 mg total) by mouth every other day., Disp: 30 tablet, Rfl: 1   furosemide (LASIX) 40 MG tablet, Take 1.5 tablets (60 mg total) by mouth 2 (two) times daily., Disp: 200 tablet, Rfl: 5   gabapentin (NEURONTIN) 800 MG tablet,  Take 1 tablet (800 mg total) by mouth 3 (three) times daily., Disp: 90 tablet, Rfl: 5   HUMALOG MIX 75/25 KWIKPEN (75-25) 100 UNIT/ML KwikPen, DIAL 30 UNITS AND INJECT UNDER THE SKIN TWICE DAILY. MAX DAILY DOSE: 60 UNITS., Disp: 60 mL, Rfl: 0   metFORMIN (GLUCOPHAGE-XR) 500 MG 24 hr tablet, Take 1 tablet (500 mg total) by mouth 2 (two) times daily with a meal., Disp: 180 tablet, Rfl: 1   metoprolol succinate (TOPROL-XL) 50 MG 24 hr tablet, Take 1.5 tablets (75 mg total) by mouth daily., Disp: 45 tablet, Rfl: 11   Multiple Vitamins-Minerals (CENTRUM SILVER 50+MEN) TABS, Take 1 tablet by mouth daily., Disp: , Rfl:    Omega-3 Fatty Acids (FISH OIL) 1000 MG CAPS, Take 1,000 mg by mouth at bedtime., Disp: , Rfl:    pantoprazole (PROTONIX) 40 MG tablet, Take 1 tablet by mouth once daily, Disp: 90 tablet, Rfl: 0   potassium chloride SA (KLOR-CON M) 20  MEQ tablet, Take 2 tablets (40 mEq total) by mouth daily., Disp: 60 tablet, Rfl: 6   QUEtiapine (SEROQUEL) 300 MG tablet, Take 1.5 tablets (450 mg total) by mouth at bedtime., Disp: 135 tablet, Rfl: 1   RELION PEN NEEDLES 31G X 6 MM MISC, INJECT 1 PEN INTO THE SKIN 2 TIMES DAILY, Disp: 100 each, Rfl: 0   Semaglutide, 2 MG/DOSE, (OZEMPIC, 2 MG/DOSE,) 8 MG/3ML SOPN, Inject 2 mg into the skin once a week. Monday, Disp: , Rfl:    sildenafil (VIAGRA) 100 MG tablet, TAKE 1/2 TO 1 (ONE-HALF TO ONE) TABLET BY MOUTH ONCE DAILY AS NEEDED FOR ERECTILE DYSFUNCTION, Disp: 25 tablet, Rfl: 0   spironolactone (ALDACTONE) 25 MG tablet, Take 1 tablet (25 mg total) by mouth daily., Disp: 90 tablet, Rfl: 3   Vitamin D, Ergocalciferol, (DRISDOL) 1.25 MG (50000 UNIT) CAPS capsule, Take 1 capsule by mouth once a week, Disp: 5 capsule, Rfl: 0   colchicine 0.6 MG tablet, Daily for 7 days then daily PRN for gout flare (Patient not taking: Reported on 12/13/2023), Disp: 30 tablet, Rfl: 0   DULoxetine (CYMBALTA) 30 MG capsule, Take 1 capsule by mouth once daily (Patient not taking:  Reported on 11/14/2023), Disp: 30 capsule, Rfl: 2   tadalafil (CIALIS) 20 MG tablet, Take 20 mg by mouth daily as needed. (Patient not taking: Reported on 12/13/2023), Disp: , Rfl:    zinc gluconate 50 MG tablet, Take 50 mg by mouth daily. (Patient not taking: Reported on 11/14/2023), Disp: , Rfl:  No Known Allergies   Social History   Socioeconomic History   Marital status: Legally Separated    Spouse name: Not on file   Number of children: Not on file   Years of education: Not on file   Highest education level: Not on file  Occupational History   Not on file  Tobacco Use   Smoking status: Never    Passive exposure: Never   Smokeless tobacco: Never  Vaping Use   Vaping status: Never Used  Substance and Sexual Activity   Alcohol use: Not Currently    Alcohol/week: 2.0 standard drinks of alcohol    Types: 2 Cans of beer per week   Drug use: No   Sexual activity: Yes    Birth control/protection: None  Other Topics Concern   Not on file  Social History Narrative   Not on file   Social Drivers of Health   Financial Resource Strain: Medium Risk (05/24/2023)   Overall Financial Resource Strain (CARDIA)    Difficulty of Paying Living Expenses: Somewhat hard  Food Insecurity: Food Insecurity Present (06/07/2023)   Hunger Vital Sign    Worried About Running Out of Food in the Last Year: Sometimes true    Ran Out of Food in the Last Year: Sometimes true  Transportation Needs: No Transportation Needs (05/24/2023)   PRAPARE - Administrator, Civil Service (Medical): No    Lack of Transportation (Non-Medical): No  Physical Activity: Sufficiently Active (05/24/2023)   Exercise Vital Sign    Days of Exercise per Week: 7 days    Minutes of Exercise per Session: 30 min  Stress: No Stress Concern Present (05/24/2023)   [employer] of Occupational Health - Occupational Stress Questionnaire    Feeling of Stress : Not at all  Social Connections: Moderately Isolated  (05/24/2023)   Social Connection and Isolation Panel [NHANES]    Frequency of Communication with Friends and Family: More than three times a  week    Frequency of Social Gatherings with Friends and Family: More than three times a week    Attends Religious Services: Never    Database administrator or Organizations: No    Attends Banker Meetings: Never    Marital Status: Living with partner  Intimate Partner Violence: Not At Risk (05/24/2023)   Humiliation, Afraid, Rape, and Kick questionnaire    Fear of Current or Ex-Partner: No    Emotionally Abused: No    Physically Abused: No    Sexually Abused: No    Physical Exam      Future Appointments  Date Time Provider Department Center  01/01/2024  1:00 PM [facility] ECHO OP 1 [facility]  01/01/2024  2:30 PM [facility] PA/NP [facility] None  01/02/2024 10:40 AM Ivonne Andrew, NP [facility] None  03/05/2024  7:40 AM [facility] DEVICE REMOTES [facility]  05/29/2024 12:30 PM [facility]-ANNUAL WELLNESS VISIT [facility] None  06/05/2024  7:40 AM [facility] DEVICE REMOTES [facility]  09/05/2024  7:40 AM [facility] DEVICE REMOTES [facility]

## 2023-12-19 ENCOUNTER — Other Ambulatory Visit (HOSPITAL_COMMUNITY): Payer: Self-pay

## 2023-12-19 NOTE — Progress Notes (Signed)
Paramedicine Encounter    Patient ID: [name], male    DOB: Apr 02, 1961, 63 y.o.   MRN: [id number]   Complaints- -flu like symptoms X3 days  Fever, chills, body aches, vomiting, diarrhea, coughing with congestion.   Assessment- CAOX4, warm and dry, fatigued, coughing with congestion, lungs had some congestion with wheezing, weight down two lbs from last week. No lower leg edema.   Compliance with meds- missed two night doses   Pill box filled- one week   Refills needed- spironolactone   Meds changes since last visit- none     Social changes- none    VISIT SUMMARY**  Arrived for home visit for [name] who was seated in his kitchen alert and oriented reporting to be feeling sick. He says he has had flu like symptoms for three days with fever, cough, chills, congestion, body aches, diarrhea and vomiting. He says he caught it from his granddaughter and then his wife. He has been taking OTC meds with little relief. I encouraged him to hydrate well and to go to urgent care if needed if symptoms persist. He agreed. I obtained vitals and reviewed meds and filled pill box for one week. He lost his [name] reader- his phone is not compatible. He plans to get one from [facility] in the coming days. I will call ahead to confirm copay. Refills called into [facility]. I reviewed upcoming appointments. Confirming he goes to his counselor today at 1:00. I will come out again next week on Weds.   BP 130/62   Pulse 70   Resp 18   Wt 280 lb (127 kg)   SpO2 97%   BMI 35.95 kg/m  Weight yesterday-didn't weigh  Last visit weight-- 282lbs   CBG- 167  Freestyle Meter Copay- $0      ACTION: Home visit completed     Patient Care Team: Ivonne Andrew, NP as PCP - General (Pulmonary Disease) Quintella Reichert, MD as PCP - Cardiology (Cardiology) Laurey Morale, MD as PCP - Advanced Heart Failure (Cardiology) Lanier Prude, MD as PCP - Electrophysiology (Cardiology) Burna Sis,  LCSW as Social Worker (Licensed Clinical Social Worker) Tressia Danas, MD (Inactive) as Consulting Physician (Gastroenterology)  Patient Active Problem List   Diagnosis Date Noted   PVC's (premature ventricular contractions) 09/28/2023   Acute right-sided low back pain with right-sided sciatica 05/14/2023   Multiple joint pain 12/22/2022   Obesity (BMI 35.0-39.9 without comorbidity) 07/18/2022   Cellulitis 06/22/2022   Cellulitis, leg 06/20/2022   Presence of Watchman left atrial appendage closure device 10/28/2021   Atrial fibrillation (HCC) 10/27/2021   Melena    Chronic diastolic CHF (congestive heart failure) (HCC)    GI bleed 06/29/2021   Severe anemia 06/29/2021   Adenomatous polyp of ascending colon    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   Acute blood loss anemia    Angiodysplasia of stomach    Chronic anticoagulation    CHF exacerbation (HCC) 06/15/2020   AKI (acute kidney injury) (HCC) 06/15/2020   CKD (chronic kidney disease), stage III (HCC) 06/15/2020   Anemia due to chronic blood loss    Gastric AVM    Angiodysplasia of duodenum with hemorrhage    Chronic systolic CHF (congestive heart failure) (HCC) 04/05/2020   Anemia 04/05/2020   PAF (paroxysmal atrial fibrillation) (HCC) 04/05/2020   OSA (obstructive sleep apnea) 04/05/2020   Syncope and collapse 04/05/2020   Type 2 diabetes mellitus with hyperglycemia, with long-term current use of insulin (  HCC) 12/03/2019   Diabetic polyneuropathy associated with type 2 diabetes mellitus (HCC) 12/03/2019   Erectile dysfunction 12/03/2019   Paranoid schizophrenia, chronic condition (HCC) 01/26/2015   Severe recurrent major depressive disorder with psychotic features (HCC) 01/26/2015   GAD (generalized anxiety disorder) 01/26/2015   OCD (obsessive compulsive disorder) 01/26/2015   Panic disorder without agoraphobia 01/26/2015   Insomnia 01/26/2015    Current Outpatient Medications:     acetaminophen (TYLENOL) 500 MG tablet, Take 2 tablets (1,000 mg total) by mouth 2 (two) times daily as needed. (Patient taking differently: Take 1,000 mg by mouth 2 (two) times daily as needed. Takes 1000mg  morning and night.), Disp: 180 tablet, Rfl: 3   albuterol (VENTOLIN HFA) 108 (90 Base) MCG/ACT inhaler, Inhale 2 puffs into the lungs every 6 (six) hours as needed for wheezing or shortness of breath., Disp: 18 g, Rfl: 1   aspirin EC 81 MG tablet, Take 81 mg by mouth daily. Swallow whole., Disp: , Rfl:    atorvastatin (LIPITOR) 40 MG tablet, Take 1 tablet (40 mg total) by mouth daily., Disp: 90 tablet, Rfl: 3   blood glucose meter kit and supplies KIT, Dispense based on patient and insurance preference. Use up to four times daily as directed. (FOR ICD-9 250.00, 250.01)., Disp: 1 each, Rfl: 0   colchicine 0.6 MG tablet, Daily for 7 days then daily PRN for gout flare, Disp: 30 tablet, Rfl: 0   Continuous Glucose Receiver (FREESTYLE LIBRE 3 READER) DEVI, 1 each by Does not apply route daily. Use to check glucose continuously, Disp: 1 each, Rfl: 1   Continuous Glucose Sensor (FREESTYLE LIBRE 3 SENSOR) MISC, Place 1 sensor on the skin every 14 days. Use to check glucose continuously, Disp: 6 each, Rfl: 2   diclofenac Sodium (VOLTAREN) 1 % GEL, Apply 2 g topically daily as needed for pain., Disp: , Rfl:    DULoxetine (CYMBALTA) 30 MG capsule, Take 1 capsule by mouth once daily (Patient not taking: Reported on 12/19/2023), Disp: 30 capsule, Rfl: 2   empagliflozin (JARDIANCE) 10 MG TABS tablet, TAKE 1 TABLET BY MOUTH ONCE DAILY ., Disp: 90 tablet, Rfl: 3   ENTRESTO 24-26 MG, TAKE 1 TABLET BY MOUTH TWICE DAILY *FOLLOW UP APPOINTMENT FOR MORE REFILLS*, Disp: 180 tablet, Rfl: 0   escitalopram (LEXAPRO) 10 MG tablet, Take 10 mg by mouth daily. Prescribed by Dr. Elvera Lennox. Dimkpa, Disp: , Rfl:    ferrous sulfate 325 (65 FE) MG tablet, Take 1 tablet (325 mg total) by mouth every other day., Disp: 30 tablet, Rfl: 1    furosemide (LASIX) 40 MG tablet, Take 1.5 tablets (60 mg total) by mouth 2 (two) times daily., Disp: 200 tablet, Rfl: 5   gabapentin (NEURONTIN) 800 MG tablet, Take 1 tablet (800 mg total) by mouth 3 (three) times daily., Disp: 90 tablet, Rfl: 5   HUMALOG MIX 75/25 KWIKPEN (75-25) 100 UNIT/ML KwikPen, DIAL 30 UNITS AND INJECT UNDER THE SKIN TWICE DAILY. MAX DAILY DOSE: 60 UNITS., Disp: 60 mL, Rfl: 0   metFORMIN (GLUCOPHAGE-XR) 500 MG 24 hr tablet, Take 1 tablet (500 mg total) by mouth 2 (two) times daily with a meal., Disp: 180 tablet, Rfl: 1   metoprolol succinate (TOPROL-XL) 50 MG 24 hr tablet, Take 1.5 tablets (75 mg total) by mouth daily., Disp: 45 tablet, Rfl: 11   Multiple Vitamins-Minerals (CENTRUM SILVER 50+MEN) TABS, Take 1 tablet by mouth daily., Disp: , Rfl:    Omega-3 Fatty Acids (FISH OIL) 1000 MG CAPS, Take 1,000  mg by mouth at bedtime., Disp: , Rfl:    pantoprazole (PROTONIX) 40 MG tablet, Take 1 tablet by mouth once daily, Disp: 90 tablet, Rfl: 0   potassium chloride SA (KLOR-CON M) 20 MEQ tablet, Take 2 tablets (40 mEq total) by mouth daily., Disp: 60 tablet, Rfl: 6   QUEtiapine (SEROQUEL) 300 MG tablet, Take 1.5 tablets (450 mg total) by mouth at bedtime., Disp: 135 tablet, Rfl: 1   RELION PEN NEEDLES 31G X 6 MM MISC, INJECT 1 PEN INTO THE SKIN 2 TIMES DAILY, Disp: 100 each, Rfl: 0   Semaglutide, 2 MG/DOSE, (OZEMPIC, 2 MG/DOSE,) 8 MG/3ML SOPN, Inject 2 mg into the skin once a week. Monday, Disp: , Rfl:    sildenafil (VIAGRA) 100 MG tablet, TAKE 1/2 TO 1 (ONE-HALF TO ONE) TABLET BY MOUTH ONCE DAILY AS NEEDED FOR ERECTILE DYSFUNCTION, Disp: 25 tablet, Rfl: 0   spironolactone (ALDACTONE) 25 MG tablet, Take 1 tablet (25 mg total) by mouth daily., Disp: 90 tablet, Rfl: 3   tadalafil (CIALIS) 20 MG tablet, Take 20 mg by mouth daily as needed. (Patient not taking: Reported on 12/19/2023), Disp: , Rfl:    Vitamin D, Ergocalciferol, (DRISDOL) 1.25 MG (50000 UNIT) CAPS capsule, Take 1 capsule  by mouth once a week, Disp: 5 capsule, Rfl: 0   zinc gluconate 50 MG tablet, Take 50 mg by mouth daily., Disp: , Rfl:  No Known Allergies   Social History   Socioeconomic History   Marital status: Legally Separated    Spouse name: Not on file   Number of children: Not on file   Years of education: Not on file   Highest education level: Not on file  Occupational History   Not on file  Tobacco Use   Smoking status: Never    Passive exposure: Never   Smokeless tobacco: Never  Vaping Use   Vaping status: Never Used  Substance and Sexual Activity   Alcohol use: Not Currently    Alcohol/week: 2.0 standard drinks of alcohol    Types: 2 Cans of beer per week   Drug use: No   Sexual activity: Yes    Birth control/protection: None  Other Topics Concern   Not on file  Social History Narrative   Not on file   Social Drivers of Health   Financial Resource Strain: Medium Risk (05/24/2023)   Overall Financial Resource Strain (CARDIA)    Difficulty of Paying Living Expenses: Somewhat hard  Food Insecurity: Food Insecurity Present (06/07/2023)   Hunger Vital Sign    Worried About Running Out of Food in the Last Year: Sometimes true    Ran Out of Food in the Last Year: Sometimes true  Transportation Needs: No Transportation Needs (05/24/2023)   PRAPARE - Administrator, Civil Service (Medical): No    Lack of Transportation (Non-Medical): No  Physical Activity: Sufficiently Active (05/24/2023)   Exercise Vital Sign    Days of Exercise per Week: 7 days    Minutes of Exercise per Session: 30 min  Stress: No Stress Concern Present (05/24/2023)   [employer] of Occupational Health - Occupational Stress Questionnaire    Feeling of Stress : Not at all  Social Connections: Moderately Isolated (05/24/2023)   Social Connection and Isolation Panel [NHANES]    Frequency of Communication with Friends and Family: More than three times a week    Frequency of Social Gatherings with  Friends and Family: More than three times a week    Attends  Religious Services: Never    Active Member of Clubs or Organizations: No    Attends Banker Meetings: Never    Marital Status: Living with partner  Intimate Partner Violence: Not At Risk (05/24/2023)   Humiliation, Afraid, Rape, and Kick questionnaire    Fear of Current or Ex-Partner: No    Emotionally Abused: No    Physically Abused: No    Sexually Abused: No    Physical Exam      Future Appointments  Date Time Provider Department Center  01/01/2024  1:00 PM [facility] ECHO OP 1 [facility]  01/01/2024  2:30 PM [facility] PA/NP [facility] None  01/02/2024 10:40 AM Ivonne Andrew, NP [facility] None  03/05/2024  7:40 AM [facility] DEVICE REMOTES [facility]  05/29/2024 12:30 PM [facility]-ANNUAL WELLNESS VISIT [facility] None  06/05/2024  7:40 AM [facility] DEVICE REMOTES [facility]  09/05/2024  7:40 AM [facility] DEVICE REMOTES [facility]

## 2023-12-26 ENCOUNTER — Other Ambulatory Visit: Payer: Self-pay | Admitting: Physical Medicine & Rehabilitation

## 2023-12-26 ENCOUNTER — Other Ambulatory Visit (HOSPITAL_COMMUNITY): Payer: Self-pay

## 2023-12-26 DIAGNOSIS — E1165 Type 2 diabetes mellitus with hyperglycemia: Secondary | ICD-10-CM

## 2023-12-26 DIAGNOSIS — G6289 Other specified polyneuropathies: Secondary | ICD-10-CM

## 2023-12-26 NOTE — Progress Notes (Signed)
Paramedicine Encounter    Patient ID: [name], male    DOB: Jul 06, 1961, 63 y.o.   MRN: [id number]   Complaints- continued flu like symptoms- coughing, congestion, headache, body fatigue   Assessment- CAOX4, warm and dry, ambulatory, congested with general fatigue, no lower leg swelling, no weight gain, lungs clear, vitals obtained- BP noted to be elevated- admits he has been taking OTC mucinex and nyquil.   Compliance with meds- no missed doses in pill box   Pill box filled- for one week   Refills needed- seroquel, gabapentin   Meds changes since last visit- none     Social changes- none    VISIT SUMMARY**  Arrived for home visit for [name] who reports to still be feeling poorly with flu like symptoms but says he is getting some relief with OTC meds. Vitals and assessment obtained. He is noting to be eating bacon for breakfast- I advised him the importance of adhering to a low sodium diet. He verbalized understanding. I reviewed meds and confirmed same filling pill box for one week. Refills will be called into [facility]. I reminded him of his clinic visit next week and he confirmed. I plan to meet him there for our weekly visit. I also educated on OTC meds and trying to use the heart safe ones rather than the Mucinex and Nyquils. He verbalized understanding. Home visit complete. I will see [name] in one week.   CBG- 120  Freestyle Sensor Applied.   BP (!) 150/98   Pulse 87   Resp 18   Wt 277 lb (125.6 kg)   SpO2 96%   BMI 35.56 kg/m  Weight yesterday-- didn't weigh  Last visit weight-- 180lbs      ACTION: Home visit completed     Patient Care Team: Ivonne Andrew, NP as PCP - General (Pulmonary Disease) Quintella Reichert, MD as PCP - Cardiology (Cardiology) Laurey Morale, MD as PCP - Advanced Heart Failure (Cardiology) Lanier Prude, MD as PCP - Electrophysiology (Cardiology) Burna Sis, LCSW as Social Worker (Licensed Clinical Social  Worker) Tressia Danas, MD (Inactive) as Consulting Physician (Gastroenterology)  Patient Active Problem List   Diagnosis Date Noted   PVC's (premature ventricular contractions) 09/28/2023   Acute right-sided low back pain with right-sided sciatica 05/14/2023   Multiple joint pain 12/22/2022   Obesity (BMI 35.0-39.9 without comorbidity) 07/18/2022   Cellulitis 06/22/2022   Cellulitis, leg 06/20/2022   Presence of Watchman left atrial appendage closure device 10/28/2021   Atrial fibrillation (HCC) 10/27/2021   Melena    Chronic diastolic CHF (congestive heart failure) (HCC)    GI bleed 06/29/2021   Severe anemia 06/29/2021   Adenomatous polyp of ascending colon    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   Acute blood loss anemia    Angiodysplasia of stomach    Chronic anticoagulation    CHF exacerbation (HCC) 06/15/2020   AKI (acute kidney injury) (HCC) 06/15/2020   CKD (chronic kidney disease), stage III (HCC) 06/15/2020   Anemia due to chronic blood loss    Gastric AVM    Angiodysplasia of duodenum with hemorrhage    Chronic systolic CHF (congestive heart failure) (HCC) 04/05/2020   Anemia 04/05/2020   PAF (paroxysmal atrial fibrillation) (HCC) 04/05/2020   OSA (obstructive sleep apnea) 04/05/2020   Syncope and collapse 04/05/2020   Type 2 diabetes mellitus with hyperglycemia, with long-term current use of insulin (HCC) 12/03/2019   Diabetic polyneuropathy associated with type 2 diabetes  mellitus (HCC) 12/03/2019   Erectile dysfunction 12/03/2019   Paranoid schizophrenia, chronic condition (HCC) 01/26/2015   Severe recurrent major depressive disorder with psychotic features (HCC) 01/26/2015   GAD (generalized anxiety disorder) 01/26/2015   OCD (obsessive compulsive disorder) 01/26/2015   Panic disorder without agoraphobia 01/26/2015   Insomnia 01/26/2015    Current Outpatient Medications:    acetaminophen (TYLENOL) 500 MG tablet, Take 2  tablets (1,000 mg total) by mouth 2 (two) times daily as needed. (Patient taking differently: Take 1,000 mg by mouth 2 (two) times daily as needed. Takes 1000mg  morning and night.), Disp: 180 tablet, Rfl: 3   albuterol (VENTOLIN HFA) 108 (90 Base) MCG/ACT inhaler, Inhale 2 puffs into the lungs every 6 (six) hours as needed for wheezing or shortness of breath., Disp: 18 g, Rfl: 1   aspirin EC 81 MG tablet, Take 81 mg by mouth daily. Swallow whole., Disp: , Rfl:    atorvastatin (LIPITOR) 40 MG tablet, Take 1 tablet (40 mg total) by mouth daily., Disp: 90 tablet, Rfl: 3   blood glucose meter kit and supplies KIT, Dispense based on patient and insurance preference. Use up to four times daily as directed. (FOR ICD-9 250.00, 250.01)., Disp: 1 each, Rfl: 0   colchicine 0.6 MG tablet, Daily for 7 days then daily PRN for gout flare, Disp: 30 tablet, Rfl: 0   Continuous Glucose Receiver (FREESTYLE LIBRE 3 READER) DEVI, 1 each by Does not apply route daily. Use to check glucose continuously, Disp: 1 each, Rfl: 1   Continuous Glucose Sensor (FREESTYLE LIBRE 3 SENSOR) MISC, Place 1 sensor on the skin every 14 days. Use to check glucose continuously, Disp: 6 each, Rfl: 2   diclofenac Sodium (VOLTAREN) 1 % GEL, Apply 2 g topically daily as needed for pain., Disp: , Rfl:    empagliflozin (JARDIANCE) 10 MG TABS tablet, TAKE 1 TABLET BY MOUTH ONCE DAILY ., Disp: 90 tablet, Rfl: 3   ENTRESTO 24-26 MG, TAKE 1 TABLET BY MOUTH TWICE DAILY *FOLLOW UP APPOINTMENT FOR MORE REFILLS*, Disp: 180 tablet, Rfl: 0   escitalopram (LEXAPRO) 10 MG tablet, Take 10 mg by mouth daily. Prescribed by Dr. Elvera Lennox. Dimkpa, Disp: , Rfl:    ferrous sulfate 325 (65 FE) MG tablet, Take 1 tablet (325 mg total) by mouth every other day., Disp: 30 tablet, Rfl: 1   furosemide (LASIX) 40 MG tablet, Take 1.5 tablets (60 mg total) by mouth 2 (two) times daily., Disp: 200 tablet, Rfl: 5   gabapentin (NEURONTIN) 800 MG tablet, Take 1 tablet (800 mg total) by  mouth 3 (three) times daily., Disp: 90 tablet, Rfl: 5   HUMALOG MIX 75/25 KWIKPEN (75-25) 100 UNIT/ML KwikPen, DIAL 30 UNITS AND INJECT UNDER THE SKIN TWICE DAILY. MAX DAILY DOSE: 60 UNITS., Disp: 60 mL, Rfl: 0   metFORMIN (GLUCOPHAGE-XR) 500 MG 24 hr tablet, Take 1 tablet (500 mg total) by mouth 2 (two) times daily with a meal., Disp: 180 tablet, Rfl: 1   metoprolol succinate (TOPROL-XL) 50 MG 24 hr tablet, Take 1.5 tablets (75 mg total) by mouth daily., Disp: 45 tablet, Rfl: 11   Multiple Vitamins-Minerals (CENTRUM SILVER 50+MEN) TABS, Take 1 tablet by mouth daily., Disp: , Rfl:    Omega-3 Fatty Acids (FISH OIL) 1000 MG CAPS, Take 1,000 mg by mouth at bedtime., Disp: , Rfl:    pantoprazole (PROTONIX) 40 MG tablet, Take 1 tablet by mouth once daily, Disp: 90 tablet, Rfl: 0   potassium chloride SA (KLOR-CON M) 20  MEQ tablet, Take 2 tablets (40 mEq total) by mouth daily., Disp: 60 tablet, Rfl: 6   QUEtiapine (SEROQUEL) 300 MG tablet, Take 1.5 tablets (450 mg total) by mouth at bedtime., Disp: 135 tablet, Rfl: 1   RELION PEN NEEDLES 31G X 6 MM MISC, INJECT 1 PEN INTO THE SKIN 2 TIMES DAILY, Disp: 100 each, Rfl: 0   Semaglutide, 2 MG/DOSE, (OZEMPIC, 2 MG/DOSE,) 8 MG/3ML SOPN, Inject 2 mg into the skin once a week. Monday, Disp: , Rfl:    sildenafil (VIAGRA) 100 MG tablet, TAKE 1/2 TO 1 (ONE-HALF TO ONE) TABLET BY MOUTH ONCE DAILY AS NEEDED FOR ERECTILE DYSFUNCTION, Disp: 25 tablet, Rfl: 0   spironolactone (ALDACTONE) 25 MG tablet, Take 1 tablet (25 mg total) by mouth daily., Disp: 90 tablet, Rfl: 3   Vitamin D, Ergocalciferol, (DRISDOL) 1.25 MG (50000 UNIT) CAPS capsule, Take 1 capsule by mouth once a week, Disp: 5 capsule, Rfl: 0   DULoxetine (CYMBALTA) 30 MG capsule, Take 1 capsule by mouth once daily (Patient not taking: Reported on 12/26/2023), Disp: 30 capsule, Rfl: 2   tadalafil (CIALIS) 20 MG tablet, Take 20 mg by mouth daily as needed. (Patient not taking: Reported on 12/26/2023), Disp: , Rfl:     zinc gluconate 50 MG tablet, Take 50 mg by mouth daily. (Patient not taking: Reported on 12/26/2023), Disp: , Rfl:  No Known Allergies   Social History   Socioeconomic History   Marital status: Legally Separated    Spouse name: Not on file   Number of children: Not on file   Years of education: Not on file   Highest education level: Not on file  Occupational History   Not on file  Tobacco Use   Smoking status: Never    Passive exposure: Never   Smokeless tobacco: Never  Vaping Use   Vaping status: Never Used  Substance and Sexual Activity   Alcohol use: Not Currently    Alcohol/week: 2.0 standard drinks of alcohol    Types: 2 Cans of beer per week   Drug use: No   Sexual activity: Yes    Birth control/protection: None  Other Topics Concern   Not on file  Social History Narrative   Not on file   Social Drivers of Health   Financial Resource Strain: Medium Risk (05/24/2023)   Overall Financial Resource Strain (CARDIA)    Difficulty of Paying Living Expenses: Somewhat hard  Food Insecurity: Food Insecurity Present (06/07/2023)   Hunger Vital Sign    Worried About Running Out of Food in the Last Year: Sometimes true    Ran Out of Food in the Last Year: Sometimes true  Transportation Needs: No Transportation Needs (05/24/2023)   PRAPARE - Administrator, Civil Service (Medical): No    Lack of Transportation (Non-Medical): No  Physical Activity: Sufficiently Active (05/24/2023)   Exercise Vital Sign    Days of Exercise per Week: 7 days    Minutes of Exercise per Session: 30 min  Stress: No Stress Concern Present (05/24/2023)   [employer] of Occupational Health - Occupational Stress Questionnaire    Feeling of Stress : Not at all  Social Connections: Moderately Isolated (05/24/2023)   Social Connection and Isolation Panel [NHANES]    Frequency of Communication with Friends and Family: More than three times a week    Frequency of Social Gatherings with  Friends and Family: More than three times a week    Attends Religious Services: Never  Active Member of Clubs or Organizations: No    Attends Banker Meetings: Never    Marital Status: Living with partner  Intimate Partner Violence: Not At Risk (05/24/2023)   Humiliation, Afraid, Rape, and Kick questionnaire    Fear of Current or Ex-Partner: No    Emotionally Abused: No    Physically Abused: No    Sexually Abused: No    Physical Exam      Future Appointments  Date Time Provider Department Center  01/01/2024  1:00 PM [facility] ECHO OP 1 [facility]  01/01/2024  2:30 PM [facility] PA/NP [facility] None  01/02/2024 10:40 AM Ivonne Andrew, NP [facility] None  03/05/2024  7:40 AM [facility] DEVICE REMOTES [facility]  05/29/2024 12:30 PM [facility]-ANNUAL WELLNESS VISIT [facility] None  06/05/2024  7:40 AM [facility] DEVICE REMOTES [facility]  09/05/2024  7:40 AM [facility] DEVICE REMOTES [facility]

## 2023-12-31 ENCOUNTER — Telehealth (HOSPITAL_COMMUNITY): Payer: Self-pay

## 2023-12-31 NOTE — Telephone Encounter (Addendum)
Called and was unable to leave patient a voice message  to confirm/remind patient of their appointment at the [facility] on 01/01/24.

## 2024-01-01 ENCOUNTER — Encounter (HOSPITAL_COMMUNITY): Payer: Self-pay

## 2024-01-01 ENCOUNTER — Ambulatory Visit (HOSPITAL_BASED_OUTPATIENT_CLINIC_OR_DEPARTMENT_OTHER)
Admission: RE | Admit: 2024-01-01 | Discharge: 2024-01-01 | Disposition: A | Discharge status: Home / Self Care | Payer: Medicare Other | Source: Ambulatory Visit | Attending: Nurse Practitioner | Admitting: Nurse Practitioner

## 2024-01-01 ENCOUNTER — Ambulatory Visit (HOSPITAL_COMMUNITY)
Admission: RE | Admit: 2024-01-01 | Discharge: 2024-01-01 | Disposition: A | Discharge status: Home / Self Care | Payer: Medicare Other | Source: Ambulatory Visit | Attending: Nurse Practitioner

## 2024-01-01 VITALS — BP 148/82 | HR 61 | Ht 74.0 in | Wt 278.6 lb

## 2024-01-01 DIAGNOSIS — E669 Obesity, unspecified: Secondary | ICD-10-CM | POA: Diagnosis not present

## 2024-01-01 DIAGNOSIS — E1122 Type 2 diabetes mellitus with diabetic chronic kidney disease: Secondary | ICD-10-CM | POA: Insufficient documentation

## 2024-01-01 DIAGNOSIS — I5032 Chronic diastolic (congestive) heart failure: Secondary | ICD-10-CM

## 2024-01-01 DIAGNOSIS — I480 Paroxysmal atrial fibrillation: Secondary | ICD-10-CM

## 2024-01-01 DIAGNOSIS — I2510 Atherosclerotic heart disease of native coronary artery without angina pectoris: Secondary | ICD-10-CM | POA: Insufficient documentation

## 2024-01-01 DIAGNOSIS — F209 Schizophrenia, unspecified: Secondary | ICD-10-CM | POA: Diagnosis not present

## 2024-01-01 DIAGNOSIS — G4733 Obstructive sleep apnea (adult) (pediatric): Secondary | ICD-10-CM

## 2024-01-01 DIAGNOSIS — Z7984 Long term (current) use of oral hypoglycemic drugs: Secondary | ICD-10-CM | POA: Diagnosis not present

## 2024-01-01 DIAGNOSIS — I493 Ventricular premature depolarization: Secondary | ICD-10-CM

## 2024-01-01 DIAGNOSIS — F17210 Nicotine dependence, cigarettes, uncomplicated: Secondary | ICD-10-CM | POA: Diagnosis not present

## 2024-01-01 DIAGNOSIS — F17200 Nicotine dependence, unspecified, uncomplicated: Secondary | ICD-10-CM

## 2024-01-01 DIAGNOSIS — Z79899 Other long term (current) drug therapy: Secondary | ICD-10-CM | POA: Insufficient documentation

## 2024-01-01 DIAGNOSIS — N1831 Chronic kidney disease, stage 3a: Secondary | ICD-10-CM | POA: Insufficient documentation

## 2024-01-01 DIAGNOSIS — Z8719 Personal history of other diseases of the digestive system: Secondary | ICD-10-CM

## 2024-01-01 DIAGNOSIS — I351 Nonrheumatic aortic (valve) insufficiency: Secondary | ICD-10-CM | POA: Diagnosis not present

## 2024-01-01 DIAGNOSIS — I10 Essential (primary) hypertension: Secondary | ICD-10-CM

## 2024-01-01 DIAGNOSIS — I130 Hypertensive heart and chronic kidney disease with heart failure and stage 1 through stage 4 chronic kidney disease, or unspecified chronic kidney disease: Secondary | ICD-10-CM | POA: Diagnosis not present

## 2024-01-01 DIAGNOSIS — I5022 Chronic systolic (congestive) heart failure: Secondary | ICD-10-CM | POA: Insufficient documentation

## 2024-01-01 DIAGNOSIS — Z6835 Body mass index (BMI) 35.0-35.9, adult: Secondary | ICD-10-CM | POA: Diagnosis not present

## 2024-01-01 DIAGNOSIS — IMO0001 Reserved for inherently not codable concepts without codable children: Secondary | ICD-10-CM

## 2024-01-01 LAB — ECHOCARDIOGRAM COMPLETE
Area-P 1/2: 3 cm2
Calc EF: 53.8 %
S' Lateral: 4.2 cm
Single Plane A2C EF: 54.6 %
Single Plane A4C EF: 51.9 %

## 2024-01-01 LAB — BASIC METABOLIC PANEL
Anion gap: 9 (ref 5–15)
BUN: 8 mg/dL (ref 8–23)
CO2: 25 mmol/L (ref 22–32)
Calcium: 8.9 mg/dL (ref 8.9–10.3)
Chloride: 106 mmol/L (ref 98–111)
Creatinine, Ser: 0.9 mg/dL (ref 0.61–1.24)
GFR, Estimated: >60 mL/min (ref >60)
Glucose, Bld: 70 mg/dL (ref 70–99)
Potassium: 3.5 mmol/L (ref 3.5–5.1)
Sodium: 140 mmol/L (ref 135–145)

## 2024-01-01 MED ORDER — NICOTINE 14 MG/24HR TD PT24: 14.0000 mg | MEDICATED_PATCH | Freq: Every day | TRANSDERMAL | 0 refills | Status: DC

## 2024-01-01 MED ORDER — NICOTINE 7 MG/24HR TD PT24: 7.0000 mg | MEDICATED_PATCH | Freq: Every day | TRANSDERMAL | 0 refills | Status: DC

## 2024-01-01 MED ORDER — NICOTINE 21 MG/24HR TD PT24: 21.0000 mg | MEDICATED_PATCH | Freq: Every day | TRANSDERMAL | 0 refills | Status: DC

## 2024-01-01 NOTE — Patient Instructions (Addendum)
 Medication Changes:  START: NICOTINE  PATCHES: 21MG  PATCH FOR 6 WEEKS- AS DIRECTED        14MG  PATCH FOR 2 WEEKS AS DIRECTED         7MG   PATCH FOR 2 WEEKS AS DIRECTED   YOU MAY HAVE TO GET THIS OVER THE COUNTER- IF SO PLEASE COMPARE PRICES   Lab Work:  Labs done today, your results will be available in MyChart, we will contact you for abnormal readings.  PLEASE CALL [facility] [phone] REGARDING YOUR CPAP  Follow-Up in: 3-4 MONTHS WITH DR. [name]  PLEASE CALL OUR OFFICE AROUND APRIL  TO GET SCHEDULED FOR YOUR APPOINTMENT. PHONE NUMBER IS [phone] OPTION 2   At the [facility], you and your health needs are our priority. We have a designated team specialized in the treatment of Heart Failure. This Care Team includes your primary Heart Failure Specialized Cardiologist (physician), Advanced Practice Providers (APPs- Physician Assistants and Nurse Practitioners), and Pharmacist who all work together to provide you with the care you need, when you need it.   You may see any of the following providers on your designated Care Team at your next follow up:  Dr. Toribio Fuel Dr. Ezra [name] Dr. Ria Commander Dr. Odis Brownie Greig Mosses, NP Caffie Shed, [place] [facility] [place], [place] Beckey Coe, NP Jordan Lee, NP Tinnie Redman, PharmD   Please be sure to bring in all your medications bottles to every appointment.   Need to Contact Us :  If you have any questions or concerns before your next appointment please send us  a message through [place] or call our office at [phone].    TO LEAVE A MESSAGE FOR THE NURSE SELECT OPTION 2, PLEASE LEAVE A MESSAGE INCLUDING: YOUR NAME DATE OF BIRTH CALL BACK NUMBER REASON FOR CALL**this is important as we prioritize the call backs  YOU WILL RECEIVE A CALL BACK THE SAME DAY AS LONG AS YOU CALL BEFORE 4:00 PM

## 2024-01-01 NOTE — Progress Notes (Signed)
 ADVANCED HEART FAILURE CLINIC NOTE  PCP: Oley Bascom RAMAN, NP Cardiology: Dr. Shlomo HF Cardiology: Dr. [name]  63 y.o. with history of chronic systolic CHF, CAD, type 2 diabetes, paroxysmal atrial fibrillation, and schizophrenia was referred by Dr. Shlomo for evaluation of CHF.  Patient had OM2 PCI in 2012.  In 11/20, he was admitted with CHF. Echo showed EF 25-30% with diffuse hypokinesis.  LHC was done, showing occluded OM2 at prior stent, 90% D1 stenosis, and extensive diffuse RCA disease.  No intervention.  He was thought to be in paroxysmal atrial fibrillation during this appointment and apixaban  was started.   He does not smoke, rarely drinks, and does not use drugs.  His mother had heart problems.    He can write his name only. He is only able to read a few words. Thinks he completed the 7th grade.    Echo in 5/21 showed EF 30% with diffuse hypokinesis, mild LVH, PASP 38, mildly decreased RV systolic function, IVC dilated. Biotronik ICD placed.   He was hospitalized in 7/21 with upper GI bleeding and CHF exacerbation.  EGD showed duodenal AVMs, treated with APC. He was diuresed.   Clinic visit 5/22 he was volume overloaded, weight up 19 lbs, and diuretics increased and Farxiga  started.   He was admitted to [facility] a couple weeks later (04/13/21) for a/c CHF. He was diuresed with IV lasix . Hospitalization complicated by GIB & AKI. Enteroscopy performed on 5/20 which showed 2 small angioectasias with no bleeding in the third portion of the duodenum. Colonoscopy on 5/21, showed multiple polyps which were biopsied. No source of bleeding found. He had pill endoscopy 5/22 which showed non bleeding AVM. Pt had repeat enteroscopy which showed non bleeding jejunal ulcer. Eliquis  held during admission but restarted at discharge. Home hydralazine , spiro, Entresto , and Imdur  stopped in setting of GIB; carvedilol  dose decreased at discharge.  He returned 6/22 for post hospitalization HF follow up. Up 10  lbs per paramedicine. NYHA II symptoms. Entresto  restarted and lasix  increased.  Admitted 7/22 for chest pain, found to have hgb of 4.7. He received 2 U PRBCs, Eliquis  was held and GI & AHF were consulted for further management. Farxiga  and spiro held with increase in kidney function. Repeat echo on 06/30/21 EF 50-55%, mildly dilated LV, mild LVH, RV okay, dilated IVC with estimated RA pressure of 8. He underwent EGD on 07/01/21 showing three non-bleeding angioectasias in the duodenum. Treated with argon plasma coagulation (APC). GDMT therapy added back as SCr improved. Eliquis  restarted and arrangement made for referral for Watchman device consideration. Day of discharge weight 324 lbs, SCr 1.29 and hgb 8.3.   S/p Watchman 10/27/21.   Device showed ATP (07/27/22) >>inappropriate shock for rapid AF/AFL, did convert to NSR. His Coreg  was changed to Toprol  at EP follow up.    Today he returns for HF follow up. Overall feeling fine. He has SOB walking further distances on flat ground. Denies palpitations, abnormal bleeding, CP, dizziness, edema, or PND/Orthopnea. Appetite ok. No fever or chills. Weight at home 277-278 pounds. Taking all medications. Followed by paramedicine. Smokes 15 cigarettes/day, no regular ETOH use. Not using CPAP. Asking for smoking cessation.  Echo today 01/01/24, EF appears stable 55-60% on my read. Awaiting MD interpretation.   ECG (personally reviewed): none ordered today.  Labs (12/22): K 4.7, creatinine 1.13 Labs (10/23): K 4.4, creatinine 1.12 Labs (1/24): K 3.8, creatinine 1.23, LFTs normal Labs (9/24): K 5.4, creatinine 1.19  PMH:  1. Atrial fibrillation:  Paroxysmal - s/p LAAO device (12/22) 2. Type 2 diabetes 3. HTN 4. Hyperlipidemia 5. Schizophrenia 6. CAD: PCI OM2 in 2012.  - LHC (11/20): 90% D1 stenosis, totally occluded OM2 at stent, serial 85%/70%/60% RCA stenoses.  7. 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  Biotronik ICD.  -  Echo (11/20): EF 25-30%, global hypokinesis.  - Echo (5/21): EF 30% with diffuse hypokinesis, mild LVH, PASP 38, mildly decreased RV systolic function, IVC dilated. - TEE (1/23): EF 50-55%, global LV HK, normal RV 8. Upper GI bleeding: 7/21, duodenal AVMs treated with APC.  9. OSA: Does not use CPAP regularly.   Social History   Socioeconomic History   Marital status: Legally Separated    Spouse name: Not on file   Number of children: Not on file   Years of education: Not on file   Highest education level: Not on file  Occupational History   Not on file  Tobacco Use   Smoking status: Never    Passive exposure: Never   Smokeless tobacco: Never  Vaping Use   Vaping status: Never Used  Substance and Sexual Activity   Alcohol use: Not Currently    Alcohol/week: 2.0 standard drinks of alcohol    Types: 2 Cans of beer per week   Drug use: No   Sexual activity: Yes    Birth control/protection: None  Other Topics Concern   Not on file  Social History Narrative   Not on file   Social Drivers of Health   Financial Resource Strain: Medium Risk (05/24/2023)   Overall Financial Resource Strain (CARDIA)    Difficulty of Paying Living Expenses: Somewhat hard  Food Insecurity: Food Insecurity Present (06/07/2023)   Hunger Vital Sign    Worried About Running Out of Food in the Last Year: Sometimes true    Ran Out of Food in the Last Year: Sometimes true  Transportation Needs: No Transportation Needs (05/24/2023)   PRAPARE - Administrator, Civil Service (Medical): No    Lack of Transportation (Non-Medical): No  Physical Activity: Sufficiently Active (05/24/2023)   Exercise Vital Sign    Days of Exercise per Week: 7 days    Minutes of Exercise per Session: 30 min  Stress: No Stress Concern Present (05/24/2023)   [employer] of Occupational Health - Occupational Stress Questionnaire    Feeling of Stress : Not at all  Social Connections: Moderately Isolated (05/24/2023)    Social Connection and Isolation Panel [NHANES]    Frequency of Communication with Friends and Family: More than three times a week    Frequency of Social Gatherings with Friends and Family: More than three times a week    Attends Religious Services: Never    Database Administrator or Organizations: No    Attends Banker Meetings: Never    Marital Status: Living with partner  Intimate Partner Violence: Not At Risk (05/24/2023)   Humiliation, Afraid, Rape, and Kick questionnaire    Fear of Current or Ex-Partner: No    Emotionally Abused: No    Physically Abused: No    Sexually Abused: No   Family History  Problem Relation Age of Onset   Heart failure Mother    Mental illness Sister    Mental illness Sister    ROS: All systems reviewed and negative except as per HPI.  Current Meds  Medication Sig   acetaminophen  (TYLENOL ) 500 MG tablet Take 2 tablets (1,000 mg total) by mouth 2 (two) times  daily as needed. (Patient taking differently: Take 1,000 mg by mouth 2 (two) times daily as needed. Takes 1000mg  morning and night.)   albuterol  (VENTOLIN  HFA) 108 (90 Base) MCG/ACT inhaler Inhale 2 puffs into the lungs every 6 (six) hours as needed for wheezing or shortness of breath.   aspirin  EC 81 MG tablet Take 81 mg by mouth daily. Swallow whole.   atorvastatin  (LIPITOR) 40 MG tablet Take 1 tablet (40 mg total) by mouth daily.   blood glucose meter kit and supplies KIT Dispense based on patient and insurance preference. Use up to four times daily as directed. (FOR ICD-9 250.00, 250.01).   colchicine  0.6 MG tablet Daily for 7 days then daily PRN for gout flare   Continuous Glucose Receiver (FREESTYLE LIBRE 3 READER) DEVI 1 each by Does not apply route daily. Use to check glucose continuously   Continuous Glucose Sensor (FREESTYLE LIBRE 3 SENSOR) MISC Place 1 sensor on the skin every 14 days. Use to check glucose continuously   diclofenac  Sodium (VOLTAREN ) 1 % GEL Apply 2 g topically  daily as needed for pain.   DULoxetine  (CYMBALTA ) 30 MG capsule Take 1 capsule by mouth once daily   empagliflozin  (JARDIANCE ) 10 MG TABS tablet TAKE 1 TABLET BY MOUTH ONCE DAILY .   ENTRESTO  24-26 MG TAKE 1 TABLET BY MOUTH TWICE DAILY *FOLLOW UP APPOINTMENT FOR MORE REFILLS*   escitalopram  (LEXAPRO ) 10 MG tablet Take 10 mg by mouth daily. Prescribed by Dr. JONELLE. Dimkpa   ferrous sulfate  325 (65 FE) MG tablet Take 1 tablet (325 mg total) by mouth every other day.   furosemide  (LASIX ) 40 MG tablet Take 1.5 tablets (60 mg total) by mouth 2 (two) times daily.   gabapentin  (NEURONTIN ) 800 MG tablet Take 1 tablet (800 mg total) by mouth 3 (three) times daily.   HUMALOG  MIX 75/25 KWIKPEN (75-25) 100 UNIT/ML KwikPen DIAL 30 UNITS AND INJECT UNDER THE SKIN TWICE DAILY. MAX DAILY DOSE: 60 UNITS.   metFORMIN  (GLUCOPHAGE -XR) 500 MG 24 hr tablet Take 1 tablet (500 mg total) by mouth 2 (two) times daily with a meal.   metoprolol  succinate (TOPROL -XL) 50 MG 24 hr tablet Take 1.5 tablets (75 mg total) by mouth daily.   Multiple Vitamins-Minerals (CENTRUM SILVER 50+MEN) TABS Take 1 tablet by mouth daily.   Omega-3 Fatty Acids (FISH OIL) 1000 MG CAPS Take 1,000 mg by mouth at bedtime.   pantoprazole  (PROTONIX ) 40 MG tablet Take 1 tablet by mouth once daily   potassium chloride  SA (KLOR-CON  M) 20 MEQ tablet Take 2 tablets (40 mEq total) by mouth daily.   QUEtiapine  (SEROQUEL ) 300 MG tablet Take 1.5 tablets (450 mg total) by mouth at bedtime.   RELION PEN NEEDLES 31G X 6 MM MISC INJECT 1 PEN INTO THE SKIN 2 TIMES DAILY   Semaglutide , 2 MG/DOSE, (OZEMPIC , 2 MG/DOSE,) 8 MG/3ML SOPN Inject 2 mg into the skin once a week. Monday   sildenafil  (VIAGRA ) 100 MG tablet TAKE 1/2 TO 1 (ONE-HALF TO ONE) TABLET BY MOUTH ONCE DAILY AS NEEDED FOR ERECTILE DYSFUNCTION   spironolactone  (ALDACTONE ) 25 MG tablet Take 1 tablet (25 mg total) by mouth daily.   tadalafil  (CIALIS ) 20 MG tablet Take 20 mg by mouth daily as needed.   Vitamin  D, Ergocalciferol , (DRISDOL ) 1.25 MG (50000 UNIT) CAPS capsule Take 1 capsule by mouth once a week   zinc gluconate 50 MG tablet Take 50 mg by mouth daily.   Current Outpatient Medications on File Prior  to Encounter  Medication Sig Dispense Refill   acetaminophen  (TYLENOL ) 500 MG tablet Take 2 tablets (1,000 mg total) by mouth 2 (two) times daily as needed. (Patient taking differently: Take 1,000 mg by mouth 2 (two) times daily as needed. Takes 1000mg  morning and night.) 180 tablet 3   albuterol  (VENTOLIN  HFA) 108 (90 Base) MCG/ACT inhaler Inhale 2 puffs into the lungs every 6 (six) hours as needed for wheezing or shortness of breath. 18 g 1   aspirin  EC 81 MG tablet Take 81 mg by mouth daily. Swallow whole.     atorvastatin  (LIPITOR) 40 MG tablet Take 1 tablet (40 mg total) by mouth daily. 90 tablet 3   blood glucose meter kit and supplies KIT Dispense based on patient and insurance preference. Use up to four times daily as directed. (FOR ICD-9 250.00, 250.01). 1 each 0   colchicine  0.6 MG tablet Daily for 7 days then daily PRN for gout flare 30 tablet 0   Continuous Glucose Receiver (FREESTYLE LIBRE 3 READER) DEVI 1 each by Does not apply route daily. Use to check glucose continuously 1 each 1   Continuous Glucose Sensor (FREESTYLE LIBRE 3 SENSOR) MISC Place 1 sensor on the skin every 14 days. Use to check glucose continuously 6 each 2   diclofenac  Sodium (VOLTAREN ) 1 % GEL Apply 2 g topically daily as needed for pain.     DULoxetine  (CYMBALTA ) 30 MG capsule Take 1 capsule by mouth once daily 30 capsule 2   empagliflozin  (JARDIANCE ) 10 MG TABS tablet TAKE 1 TABLET BY MOUTH ONCE DAILY . 90 tablet 3   ENTRESTO  24-26 MG TAKE 1 TABLET BY MOUTH TWICE DAILY *FOLLOW UP APPOINTMENT FOR MORE REFILLS* 180 tablet 0   escitalopram  (LEXAPRO ) 10 MG tablet Take 10 mg by mouth daily. Prescribed by Dr. FABIENE Khat     ferrous sulfate  325 (65 FE) MG tablet Take 1 tablet (325 mg total) by mouth every other day. 30  tablet 1   furosemide  (LASIX ) 40 MG tablet Take 1.5 tablets (60 mg total) by mouth 2 (two) times daily. 200 tablet 5   gabapentin  (NEURONTIN ) 800 MG tablet Take 1 tablet (800 mg total) by mouth 3 (three) times daily. 90 tablet 5   HUMALOG  MIX 75/25 KWIKPEN (75-25) 100 UNIT/ML KwikPen DIAL 30 UNITS AND INJECT UNDER THE SKIN TWICE DAILY. MAX DAILY DOSE: 60 UNITS. 60 mL 0   metFORMIN  (GLUCOPHAGE -XR) 500 MG 24 hr tablet Take 1 tablet (500 mg total) by mouth 2 (two) times daily with a meal. 180 tablet 1   metoprolol  succinate (TOPROL -XL) 50 MG 24 hr tablet Take 1.5 tablets (75 mg total) by mouth daily. 45 tablet 11   Multiple Vitamins-Minerals (CENTRUM SILVER 50+MEN) TABS Take 1 tablet by mouth daily.     Omega-3 Fatty Acids (FISH OIL) 1000 MG CAPS Take 1,000 mg by mouth at bedtime.     pantoprazole  (PROTONIX ) 40 MG tablet Take 1 tablet by mouth once daily 90 tablet 0   potassium chloride  SA (KLOR-CON  M) 20 MEQ tablet Take 2 tablets (40 mEq total) by mouth daily. 60 tablet 6   QUEtiapine  (SEROQUEL ) 300 MG tablet Take 1.5 tablets (450 mg total) by mouth at bedtime. 135 tablet 1   RELION PEN NEEDLES 31G X 6 MM MISC INJECT 1 PEN INTO THE SKIN 2 TIMES DAILY 100 each 0   Semaglutide , 2 MG/DOSE, (OZEMPIC , 2 MG/DOSE,) 8 MG/3ML SOPN Inject 2 mg into the skin once a week. Monday  sildenafil  (VIAGRA ) 100 MG tablet TAKE 1/2 TO 1 (ONE-HALF TO ONE) TABLET BY MOUTH ONCE DAILY AS NEEDED FOR ERECTILE DYSFUNCTION 25 tablet 0   spironolactone  (ALDACTONE ) 25 MG tablet Take 1 tablet (25 mg total) by mouth daily. 90 tablet 3   tadalafil  (CIALIS ) 20 MG tablet Take 20 mg by mouth daily as needed.     Vitamin D , Ergocalciferol , (DRISDOL ) 1.25 MG (50000 UNIT) CAPS capsule Take 1 capsule by mouth once a week 5 capsule 0   zinc gluconate 50 MG tablet Take 50 mg by mouth daily.     No current facility-administered medications on file prior to encounter.   BP (!) 148/82   Pulse 61   Ht 6' 2 (1.88 m)   Wt 126.4 kg (278 lb  9.6 oz)   SpO2 97%   BMI 35.77 kg/m   Wt Readings from Last 3 Encounters:  01/01/24 126.4 kg (278 lb 9.6 oz)  12/26/23 125.6 kg (277 lb)  12/19/23 127 kg (280 lb)   Physical Exam General:  NAD. No resp difficulty, walked into clinic HEENT: Normal Neck: Supple. No JVD. Cor: Regular rate & rhythm. No rubs, gallops or murmurs. Lungs: Clear, diminished in bases Abdomen: Soft, nontender, nondistended.  Extremities: No cyanosis, clubbing, rash, edema Neuro: Alert & oriented x 3, moves all 4 extremities w/o difficulty. Affect pleasant.  Assessment/Plan: 1. Chronic systolic CHF with recovery in LV function: Echo in 11/20 with EF 25-30%, echo 5/21 with EF 30% with mildly decreased RV systolic function. Biotronik ICD.  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 Echo 8/22 with improvement in LVEF to 50-55%. No longer drinking ETOH. TEE (1/23) EF 50-55%. Echo today EF appears stable at 55-60%. NYHA class II. He is not volume overloaded. - Continue Lasix  60 mg bid + 40 KCL daily. BMET today. - Continue Toprol  XL 75 mg daily.  - Continue Entresto  24/26 mg bid.  - Continue spironolactone  25 mg daily.  - Continue Jardiance  10 mg daily. .  2. 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 No further bleeding issues now that he is off anticoagulation s/p Watchman.  - Continue PPI.  3. Paroxysmal atrial fibrillation: CHADS2-VASc score = 4 (CAD, CHF, HTN, DM).  Off Eliquis  with frequent GIB.  Now s/p Watchman 12/22. Regular on exam today. - Continue ASA 81 with Watchman.  4. CAD: Last cath 09/2021 with occluded OM2 stent, 90% small OM1, diffuse disease RCA treated medically.  No chest pain.  - Continue ASA and statin. 5. HTN: BP up a bit today but he has not had his medications yet.  - No change to meds today. - Needs CPAP. 6. OSA: Not wearing CPAP. Due for follow up with pulmonology. - Given # for Pulm office today and asked to follow up. Needs to  wear CPAP. 7. CKD 3a: Continue SGLT2i. BMET today. 8. PVCs: Zio 11/24 showed 4% PVC burden, short NSVT run. - Continue Toprol  - As above, needs to wear CPAP. 9. Obesity: Body mass index is 35.77 kg/m.  Weight trending down.  - He is now on semaglutide . 10. Smoking: smokes 15 cigarettes/day. Asking for meds + patch. - Will give Rx for nicotine  patches. - He is on several psyh meds. He needs to follow up with PCP for Wellbutrin  vs Chantix   Follow up in 4 months with Dr. [name].   Harlene HERO [facility] FNP-BC 01/01/24

## 2024-01-02 ENCOUNTER — Telehealth (HOSPITAL_COMMUNITY): Payer: Self-pay

## 2024-01-02 ENCOUNTER — Ambulatory Visit: Payer: Self-pay | Admitting: Nurse Practitioner

## 2024-01-02 NOTE — Telephone Encounter (Signed)
 [name] returned my call and advised he found his medications and says his pill box is full for a week- this indicates he has not taken any of his medications since my last visit last week. I explained this to [name] and he denied this saying he has taken them. He asked if we could follow up next week.   [name], EMT-Paramedic [phone] 01/02/2024

## 2024-01-02 NOTE — Telephone Encounter (Signed)
 Several calls made to [facility] this morning for scheduling our home paramedicine visit and to remind him of his PCP appointment this week but no answer. LVM and will continue to follow up.   Roberts Ching, EMT-Paramedic [phone] 01/02/2024

## 2024-01-09 ENCOUNTER — Encounter (HOSPITAL_COMMUNITY): Payer: Self-pay

## 2024-01-09 ENCOUNTER — Telehealth (HOSPITAL_COMMUNITY): Payer: Self-pay

## 2024-01-09 NOTE — Telephone Encounter (Signed)
Attempted to reach [facility] for our weekly home paramedicine visit and no answer- I left a message and will continue to follow up.   [name], EMT-Paramedic [phone] 01/09/2024

## 2024-01-14 ENCOUNTER — Other Ambulatory Visit (HOSPITAL_COMMUNITY): Payer: Self-pay

## 2024-01-15 NOTE — Progress Notes (Signed)
Paramedicine Encounter    Patient ID: [name], male    DOB: 11-10-1961, 63 y.o.   MRN: [id number]   Complaints- None   Assessment- Caox4, warm and dry, no shortness of breath, no lower leg swelling, lungs clear, vitals at baseline, denied chest pain, denied shortness of breath.   Compliance with meds- has missed several doses of medications over the last two weeks as he canceled out appointment last week due to no call no show.   Pill box filled- for one week   Refills needed- Vit D, Lasix, Gabapentin, Lexapro, Potassium, Metformin   Meds changes since last visit- none     Social changes- none   Patient questions- He is interested in getting the [facility] device for internal CPAP. I will get him an appointment with Dr. Malachy Mood office.    VISIT SUMMARY- Arrived for home visit for [facility] where he was seated outside reporting to be feeling good. He denied any chest pain, shortness of breath or dizziness. He has been non complaint with his meds as he missed our home visit last week. I asked him why and he said his phone was cut off and he was at his wifes daughters house. I gave his wife my number and told her to have him call me from her phone if his phone gets cut off or isn't working so we can stay on track. I also saved her number to reach out in case I cannot reach him on his phone. I obtained vitals and assessment. I reviewed meds and filled pill box for one week. We reviewed upcoming appointments and confirmed same. We discussed importance of compliance with meds and diet. Home visit complete. I will see [name] in one week.   BP 126/70   Pulse 75   Resp 16   Wt 275 lb (124.7 kg)   SpO2 98%   BMI 35.31 kg/m  Weight yesterday-- did not weigh  Last visit weight-- 275lbs   CBG- 85   ACTION: Home visit completed     Patient Care Team: Ivonne Andrew, NP as PCP - General (Pulmonary Disease) Quintella Reichert, MD as PCP - Cardiology (Cardiology) Laurey Morale, MD  as PCP - Advanced Heart Failure (Cardiology) Lanier Prude, MD as PCP - Electrophysiology (Cardiology) Burna Sis, LCSW as Social Worker (Licensed Clinical Social Worker) Tressia Danas, MD (Inactive) as Consulting Physician (Gastroenterology)  Patient Active Problem List   Diagnosis Date Noted   PVC's (premature ventricular contractions) 09/28/2023   Acute right-sided low back pain with right-sided sciatica 05/14/2023   Multiple joint pain 12/22/2022   Obesity (BMI 35.0-39.9 without comorbidity) 07/18/2022   Cellulitis 06/22/2022   Cellulitis, leg 06/20/2022   [facility] [employer] left atrial appendage closure device 10/28/2021   Atrial fibrillation (HCC) 10/27/2021   Melena    Chronic diastolic CHF (congestive heart failure) (HCC)    GI bleed 06/29/2021   Severe anemia 06/29/2021   Adenomatous polyp of ascending colon    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   Acute blood loss anemia    Angiodysplasia of stomach    Chronic anticoagulation    CHF exacerbation (HCC) 06/15/2020   AKI (acute kidney injury) (HCC) 06/15/2020   CKD (chronic kidney disease), stage III (HCC) 06/15/2020   Anemia due to chronic blood loss    Gastric AVM    Angiodysplasia of duodenum with hemorrhage    Chronic systolic CHF (congestive heart failure) (HCC) 04/05/2020   Anemia 04/05/2020  PAF (paroxysmal atrial fibrillation) (HCC) 04/05/2020   OSA (obstructive sleep apnea) 04/05/2020   Syncope and collapse 04/05/2020   Type 2 diabetes mellitus with hyperglycemia, with long-term current use of insulin (HCC) 12/03/2019   Diabetic polyneuropathy associated with type 2 diabetes mellitus (HCC) 12/03/2019   Erectile dysfunction 12/03/2019   Paranoid schizophrenia, chronic condition (HCC) 01/26/2015   Severe recurrent major depressive disorder with psychotic features (HCC) 01/26/2015   GAD (generalized anxiety disorder) 01/26/2015   OCD (obsessive compulsive disorder)  01/26/2015   Panic disorder without agoraphobia 01/26/2015   Insomnia 01/26/2015    Current Outpatient Medications:    acetaminophen (TYLENOL) 500 MG tablet, Take 2 tablets (1,000 mg total) by mouth 2 (two) times daily as needed. (Patient taking differently: Take 1,000 mg by mouth 2 (two) times daily as needed. Takes 1000mg  morning and night.), Disp: 180 tablet, Rfl: 3   albuterol (VENTOLIN HFA) 108 (90 Base) MCG/ACT inhaler, Inhale 2 puffs into the lungs every 6 (six) hours as needed for wheezing or shortness of breath., Disp: 18 g, Rfl: 1   aspirin EC 81 MG tablet, Take 81 mg by mouth daily. Swallow whole., Disp: , Rfl:    atorvastatin (LIPITOR) 40 MG tablet, Take 1 tablet (40 mg total) by mouth daily., Disp: 90 tablet, Rfl: 3   blood glucose meter kit and supplies KIT, Dispense based on patient and insurance preference. Use up to four times daily as directed. (FOR ICD-9 250.00, 250.01)., Disp: 1 each, Rfl: 0   Continuous Glucose Receiver (FREESTYLE LIBRE 3 READER) DEVI, 1 each by Does not apply route daily. Use to check glucose continuously, Disp: 1 each, Rfl: 1   Continuous Glucose Sensor (FREESTYLE LIBRE 3 SENSOR) MISC, Place 1 sensor on the skin every 14 days. Use to check glucose continuously, Disp: 6 each, Rfl: 2   diclofenac Sodium (VOLTAREN) 1 % GEL, Apply 2 g topically daily as needed for pain., Disp: , Rfl:    empagliflozin (JARDIANCE) 10 MG TABS tablet, TAKE 1 TABLET BY MOUTH ONCE DAILY ., Disp: 90 tablet, Rfl: 3   ENTRESTO 24-26 MG, TAKE 1 TABLET BY MOUTH TWICE DAILY *FOLLOW UP APPOINTMENT FOR MORE REFILLS*, Disp: 180 tablet, Rfl: 0   escitalopram (LEXAPRO) 10 MG tablet, Take 10 mg by mouth daily. Prescribed by Dr. Elvera Lennox. Dimkpa, Disp: , Rfl:    ferrous sulfate 325 (65 FE) MG tablet, Take 1 tablet (325 mg total) by mouth every other day., Disp: 30 tablet, Rfl: 1   furosemide (LASIX) 40 MG tablet, Take 1.5 tablets (60 mg total) by mouth 2 (two) times daily., Disp: 200 tablet, Rfl: 5    gabapentin (NEURONTIN) 800 MG tablet, Take 1 tablet (800 mg total) by mouth 3 (three) times daily., Disp: 90 tablet, Rfl: 5   HUMALOG MIX 75/25 KWIKPEN (75-25) 100 UNIT/ML KwikPen, DIAL 30 UNITS AND INJECT UNDER THE SKIN TWICE DAILY. MAX DAILY DOSE: 60 UNITS., Disp: 60 mL, Rfl: 0   metFORMIN (GLUCOPHAGE-XR) 500 MG 24 hr tablet, Take 1 tablet (500 mg total) by mouth 2 (two) times daily with a meal., Disp: 180 tablet, Rfl: 1   metoprolol succinate (TOPROL-XL) 50 MG 24 hr tablet, Take 1.5 tablets (75 mg total) by mouth daily., Disp: 45 tablet, Rfl: 11   Multiple Vitamins-Minerals (CENTRUM SILVER 50+MEN) TABS, Take 1 tablet by mouth daily., Disp: , Rfl:    Omega-3 Fatty Acids (FISH OIL) 1000 MG CAPS, Take 1,000 mg by mouth at bedtime., Disp: , Rfl:    pantoprazole (PROTONIX)  40 MG tablet, Take 1 tablet by mouth once daily, Disp: 90 tablet, Rfl: 0   potassium chloride SA (KLOR-CON M) 20 MEQ tablet, Take 2 tablets (40 mEq total) by mouth daily., Disp: 60 tablet, Rfl: 6   QUEtiapine (SEROQUEL) 300 MG tablet, Take 1.5 tablets (450 mg total) by mouth at bedtime., Disp: 135 tablet, Rfl: 1   RELION PEN NEEDLES 31G X 6 MM MISC, INJECT 1 PEN INTO THE SKIN 2 TIMES DAILY, Disp: 100 each, Rfl: 0   Semaglutide, 2 MG/DOSE, (OZEMPIC, 2 MG/DOSE,) 8 MG/3ML SOPN, Inject 2 mg into the skin once a week. Monday, Disp: , Rfl:    sildenafil (VIAGRA) 100 MG tablet, TAKE 1/2 TO 1 (ONE-HALF TO ONE) TABLET BY MOUTH ONCE DAILY AS NEEDED FOR ERECTILE DYSFUNCTION, Disp: 25 tablet, Rfl: 0   spironolactone (ALDACTONE) 25 MG tablet, Take 1 tablet (25 mg total) by mouth daily., Disp: 90 tablet, Rfl: 3   Vitamin D, Ergocalciferol, (DRISDOL) 1.25 MG (50000 UNIT) CAPS capsule, Take 1 capsule by mouth once a week, Disp: 5 capsule, Rfl: 0   zinc gluconate 50 MG tablet, Take 50 mg by mouth daily., Disp: , Rfl:    colchicine 0.6 MG tablet, Daily for 7 days then daily PRN for gout flare (Patient not taking: Reported on 01/15/2024), Disp: 30  tablet, Rfl: 0   DULoxetine (CYMBALTA) 30 MG capsule, Take 1 capsule by mouth once daily (Patient not taking: Reported on 01/15/2024), Disp: 30 capsule, Rfl: 2   nicotine (NICODERM CQ - DOSED IN MG/24 HOURS) 14 mg/24hr patch, Place 1 patch (14 mg total) onto the skin daily. (Patient not taking: Reported on 01/15/2024), Disp: 28 patch, Rfl: 0   nicotine (NICODERM CQ - DOSED IN MG/24 HOURS) 21 mg/24hr patch, Place 1 patch (21 mg total) onto the skin daily. (Patient not taking: Reported on 01/15/2024), Disp: 28 patch, Rfl: 0   nicotine (NICODERM CQ - DOSED IN MG/24 HR) 7 mg/24hr patch, Place 1 patch (7 mg total) onto the skin daily. (Patient not taking: Reported on 01/15/2024), Disp: 28 patch, Rfl: 0   tadalafil (CIALIS) 20 MG tablet, Take 20 mg by mouth daily as needed. (Patient not taking: Reported on 01/15/2024), Disp: , Rfl:  No Known Allergies   Social History   Socioeconomic History   Marital status: Legally Separated    Spouse name: Not on file   Number of children: Not on file   Years of education: Not on file   Highest education level: Not on file  Occupational History   Not on file  Tobacco Use   Smoking status: Never    Passive exposure: Never   Smokeless tobacco: Never  Vaping Use   Vaping status: Never Used  Substance and Sexual Activity   Alcohol use: Not Currently    Alcohol/week: 2.0 standard drinks of alcohol    Types: 2 Cans of beer per week   Drug use: No   Sexual activity: Yes    Birth control/protection: None  Other Topics Concern   Not on file  Social History Narrative   Not on file   Social Drivers of Health   Financial Resource Strain: Medium Risk (05/24/2023)   Overall Financial Resource Strain (CARDIA)    Difficulty of Paying Living Expenses: Somewhat hard  Food Insecurity: Food Insecurity Present (06/07/2023)   Hunger Vital Sign    Worried About Running Out of Food in the Last Year: Sometimes true    Ran Out of Food in the Last Year:  Sometimes true   Transportation Needs: No Transportation Needs (05/24/2023)   PRAPARE - Administrator, Civil Service (Medical): No    Lack of Transportation (Non-Medical): No  Physical Activity: Sufficiently Active (05/24/2023)   Exercise Vital Sign    Days of Exercise per Week: 7 days    Minutes of Exercise per Session: 30 min  Stress: No Stress Concern Present (05/24/2023)   [employer] of Occupational Health - Occupational Stress Questionnaire    Feeling of Stress : Not at all  Social Connections: Moderately Isolated (05/24/2023)   Social Connection and Isolation Panel [NHANES]    Frequency of Communication with Friends and Family: More than three times a week    Frequency of Social Gatherings with Friends and Family: More than three times a week    Attends Religious Services: Never    Database administrator or Organizations: No    Attends Banker Meetings: Never    Marital Status: Living with partner  Intimate Partner Violence: Not At Risk (05/24/2023)   Humiliation, Afraid, Rape, and Kick questionnaire    Fear of Current or Ex-Partner: No    Emotionally Abused: No    Physically Abused: No    Sexually Abused: No    Physical Exam      Future Appointments  Date Time Provider Department Center  02/04/2024  9:40 AM Ivonne Andrew, NP [facility] None  03/05/2024  7:40 AM [facility] DEVICE REMOTES [facility]  05/29/2024 12:30 PM [facility]NU[facility] WELLNESS VISIT [facility] None  06/05/2024  7:40 AM [facility] DEVICE REMOTES [facility]  09/05/2024  7:40 AM [facility] DEVICE REMOTES [facility]

## 2024-01-16 ENCOUNTER — Other Ambulatory Visit: Payer: Self-pay | Admitting: Nurse Practitioner

## 2024-01-16 ENCOUNTER — Other Ambulatory Visit: Payer: Self-pay | Admitting: Physical Medicine & Rehabilitation

## 2024-01-16 DIAGNOSIS — E1165 Type 2 diabetes mellitus with hyperglycemia: Secondary | ICD-10-CM

## 2024-01-16 DIAGNOSIS — G6289 Other specified polyneuropathies: Secondary | ICD-10-CM

## 2024-01-16 NOTE — Telephone Encounter (Signed)
 Please advise [facility]

## 2024-01-21 ENCOUNTER — Telehealth (HOSPITAL_COMMUNITY): Payer: Self-pay

## 2024-01-21 NOTE — Telephone Encounter (Signed)
 Called in attempt to see [name] today for our home visit- no answer. I left a VM. I will follow up.   [name], EMT-Paramedic [phone] 01/21/2024

## 2024-01-23 ENCOUNTER — Telehealth (HOSPITAL_COMMUNITY): Payer: Self-pay

## 2024-01-23 ENCOUNTER — Other Ambulatory Visit: Payer: Self-pay | Admitting: Nurse Practitioner

## 2024-01-23 NOTE — Telephone Encounter (Signed)
 Left message for Mr. Sedam to return my call to set up our weekly paramedicine visit.   [name], EMT-Paramedic [phone] 01/23/2024

## 2024-01-24 ENCOUNTER — Other Ambulatory Visit (HOSPITAL_COMMUNITY): Payer: Self-pay

## 2024-01-24 ENCOUNTER — Telehealth (HOSPITAL_COMMUNITY): Payer: Self-pay

## 2024-01-24 NOTE — Progress Notes (Signed)
 Paramedicine Encounter    Patient ID: [name], male    DOB: 01/29/1961, 63 y.o.   MRN: [id number]   Complaints- sinus congestion   Assessment- CAOX4, warm and dry, ambulatory without shortness of breath, no chest pain, no dizziness, no lower leg swelling, no weight gain, lungs clear, sinus congestion noted,vitals as noted. CBG- 95   Compliance with meds- missed three nighttime doses   Pill box filled- for one week  Medication plan- soon to switch to [facility] for bubble packs as he is not capable of filling his own pill box for reading the bottles to take them properly. Has no family willing to assist.   Refills needed- Lexapro Seroquel Gabapentin Metformin Potassium Vit D  (All ready at [facility] minus the lexapro and seroquel these are pending doctor approval)  Ozempic   Meds changes since last visit- none     Social changes- none    VISIT SUMMARY- Arrived for home visit for [name] who reports to be feeling fine only complaining of some sinus congestion but denied any other complaints. He has been mostly compliant with his meds over the last week only missing three nighttime doses. I reminded him the importance of taking all medications daily at the appropriate times. He agreed. We discussed plans moving forward to graduation for paramedicine and he agreed to bubble packs at [facility] as this is the best option due to no family assistance and him having a barrier with reading. I filled pill box for one week today and plan to move meds to [facility] in the coming week. I wrote down and reviewed all upcoming appointments. He agreed with same. I will see [name] in one week.     BP (!) 146/80   Pulse 72   Resp 16   Wt 275 lb (124.7 kg)   SpO2 98%   BMI 35.31 kg/m  Weight yesterday-- didn't weigh  Last visit weight-- 275 lbs      ACTION: Home visit completed     Patient Care Team: Ivonne Andrew, NP as PCP - General (Pulmonary Disease) Quintella Reichert, MD  as PCP - Cardiology (Cardiology) Laurey Morale, MD as PCP - Advanced Heart Failure (Cardiology) Lanier Prude, MD as PCP - Electrophysiology (Cardiology) Burna Sis, LCSW as Social Worker (Licensed Clinical Social Worker) Tressia Danas, MD (Inactive) as Consulting Physician (Gastroenterology)  Patient Active Problem List   Diagnosis Date Noted   PVC's (premature ventricular contractions) 09/28/2023   Acute right-sided low back pain with right-sided sciatica 05/14/2023   Multiple joint pain 12/22/2022   Obesity (BMI 35.0-39.9 without comorbidity) 07/18/2022   Cellulitis 06/22/2022   Cellulitis, leg 06/20/2022   Presence of Watch[employer] left atrial appendage closure device 10/28/2021   Atrial fibrillation (HCC) 10/27/2021   Melena    Chronic diastolic CHF (congestive heart failure) (HCC)    GI bleed 06/29/2021   Severe anemia 06/29/2021   Adenomatous polyp of ascending colon    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   Acute blood loss anemia    Angiodysplasia of stomach    Chronic anticoagulation    CHF exacerbation (HCC) 06/15/2020   AKI (acute kidney injury) (HCC) 06/15/2020   CKD (chronic kidney disease), stage III (HCC) 06/15/2020   Anemia due to chronic blood loss    Gastric AVM    Angiodysplasia of duodenum with hemorrhage    Chronic systolic CHF (congestive heart failure) (HCC) 04/05/2020   Anemia 04/05/2020   PAF (paroxysmal atrial fibrillation) (  HCC) 04/05/2020   OSA (obstructive sleep apnea) 04/05/2020   Syncope and collapse 04/05/2020   Type 2 diabetes mellitus with hyperglycemia, with long-term current use of insulin (HCC) 12/03/2019   Diabetic polyneuropathy associated with type 2 diabetes mellitus (HCC) 12/03/2019   Erectile dysfunction 12/03/2019   Paranoid schizophrenia, chronic condition (HCC) 01/26/2015   Severe recurrent major depressive disorder with psychotic features (HCC) 01/26/2015   GAD (generalized anxiety disorder)  01/26/2015   OCD (obsessive compulsive disorder) 01/26/2015   Panic disorder without agoraphobia 01/26/2015   Insomnia 01/26/2015    Current Outpatient Medications:    acetaminophen (TYLENOL) 500 MG tablet, Take 2 tablets (1,000 mg total) by mouth 2 (two) times daily as needed. (Patient taking differently: Take 1,000 mg by mouth 2 (two) times daily as needed. Takes 1000mg  morning and night.), Disp: 180 tablet, Rfl: 3   albuterol (VENTOLIN HFA) 108 (90 Base) MCG/ACT inhaler, Inhale 2 puffs into the lungs every 6 (six) hours as needed for wheezing or shortness of breath., Disp: 18 g, Rfl: 1   aspirin EC 81 MG tablet, Take 81 mg by mouth daily. Swallow whole., Disp: , Rfl:    atorvastatin (LIPITOR) 40 MG tablet, Take 1 tablet (40 mg total) by mouth daily., Disp: 90 tablet, Rfl: 3   blood glucose meter kit and supplies KIT, Dispense based on patient and insurance preference. Use up to four times daily as directed. (FOR ICD-9 250.00, 250.01)., Disp: 1 each, Rfl: 0   Continuous Glucose Receiver (FREESTYLE LIBRE 3 READER) DEVI, 1 each by Does not apply route daily. Use to check glucose continuously, Disp: 1 each, Rfl: 1   Continuous Glucose Sensor (FREESTYLE LIBRE 3 SENSOR) MISC, Place 1 sensor on the skin every 14 days. Use to check glucose continuously, Disp: 6 each, Rfl: 2   diclofenac Sodium (VOLTAREN) 1 % GEL, Apply 2 g topically daily as needed for pain., Disp: , Rfl:    empagliflozin (JARDIANCE) 10 MG TABS tablet, TAKE 1 TABLET BY MOUTH ONCE DAILY ., Disp: 90 tablet, Rfl: 3   ENTRESTO 24-26 MG, TAKE 1 TABLET BY MOUTH TWICE DAILY *FOLLOW UP APPOINTMENT FOR MORE REFILLS*, Disp: 180 tablet, Rfl: 0   escitalopram (LEXAPRO) 10 MG tablet, Take 10 mg by mouth daily. Prescribed by Dr. Elvera Lennox. Dimkpa, Disp: , Rfl:    ferrous sulfate 325 (65 FE) MG tablet, Take 1 tablet (325 mg total) by mouth every other day., Disp: 30 tablet, Rfl: 1   furosemide (LASIX) 40 MG tablet, Take 1.5 tablets (60 mg total) by mouth 2  (two) times daily., Disp: 200 tablet, Rfl: 5   gabapentin (NEURONTIN) 800 MG tablet, TAKE 1 TABLET BY MOUTH THREE TIMES DAILY, Disp: 90 tablet, Rfl: 0   HUMALOG MIX 75/25 KWIKPEN (75-25) 100 UNIT/ML KwikPen, DIAL 30 UNITS AND INJECT UNDER THE SKIN TWICE DAILY. MAX DAILY DOSE: 60 UNITS., Disp: 60 mL, Rfl: 0   metFORMIN (GLUCOPHAGE-XR) 500 MG 24 hr tablet, Take 1 tablet (500 mg total) by mouth 2 (two) times daily with a meal., Disp: 180 tablet, Rfl: 1   metoprolol succinate (TOPROL-XL) 50 MG 24 hr tablet, Take 1.5 tablets (75 mg total) by mouth daily., Disp: 45 tablet, Rfl: 11   Multiple Vitamins-Minerals (CENTRUM SILVER 50+MEN) TABS, Take 1 tablet by mouth daily., Disp: , Rfl:    Omega-3 Fatty Acids (FISH OIL) 1000 MG CAPS, Take 1,000 mg by mouth at bedtime., Disp: , Rfl:    pantoprazole (PROTONIX) 40 MG tablet, Take 1 tablet by mouth  once daily, Disp: 90 tablet, Rfl: 0   potassium chloride SA (KLOR-CON M) 20 MEQ tablet, Take 2 tablets (40 mEq total) by mouth daily., Disp: 60 tablet, Rfl: 6   QUEtiapine (SEROQUEL) 300 MG tablet, Take 1.5 tablets (450 mg total) by mouth at bedtime., Disp: 135 tablet, Rfl: 1   RELION PEN NEEDLES 31G X 6 MM MISC, INJECT 1 PEN INTO THE SKIN 2 TIMES DAILY, Disp: 100 each, Rfl: 0   Semaglutide, 2 MG/DOSE, (OZEMPIC, 2 MG/DOSE,) 8 MG/3ML SOPN, Inject 2 mg into the skin once a week. Monday, Disp: , Rfl:    sildenafil (VIAGRA) 100 MG tablet, TAKE 1/2 TO 1 (ONE-HALF TO ONE) TABLET BY MOUTH ONCE DAILY AS NEEDED FOR ERECTILE DYSFUNCTION, Disp: 25 tablet, Rfl: 0   spironolactone (ALDACTONE) 25 MG tablet, Take 1 tablet (25 mg total) by mouth daily., Disp: 90 tablet, Rfl: 3   Vitamin D, Ergocalciferol, (DRISDOL) 1.25 MG (50000 UNIT) CAPS capsule, Take 1 capsule by mouth once a week, Disp: 5 capsule, Rfl: 0   colchicine 0.6 MG tablet, Daily for 7 days then daily PRN for gout flare (Patient not taking: Reported on 01/24/2024), Disp: 30 tablet, Rfl: 0   DULoxetine (CYMBALTA) 30 MG  capsule, Take 1 capsule by mouth once daily (Patient not taking: Reported on 01/24/2024), Disp: 30 capsule, Rfl: 2   nicotine (NICODERM CQ - DOSED IN MG/24 HOURS) 14 mg/24hr patch, Place 1 patch (14 mg total) onto the skin daily. (Patient not taking: Reported on 01/24/2024), Disp: 28 patch, Rfl: 0   nicotine (NICODERM CQ - DOSED IN MG/24 HOURS) 21 mg/24hr patch, Place 1 patch (21 mg total) onto the skin daily. (Patient not taking: Reported on 01/24/2024), Disp: 28 patch, Rfl: 0   nicotine (NICODERM CQ - DOSED IN MG/24 HR) 7 mg/24hr patch, Place 1 patch (7 mg total) onto the skin daily. (Patient not taking: Reported on 01/24/2024), Disp: 28 patch, Rfl: 0   tadalafil (CIALIS) 20 MG tablet, Take 20 mg by mouth daily as needed. (Patient not taking: Reported on 01/15/2024), Disp: , Rfl:    zinc gluconate 50 MG tablet, Take 50 mg by mouth daily. (Patient not taking: Reported on 01/24/2024), Disp: , Rfl:  No Known Allergies   Social History   Socioeconomic History   Marital status: Legally Separated    Spouse name: Not on file   Number of children: Not on file   Years of education: Not on file   Highest education level: Not on file  Occupational History   Not on file  Tobacco Use   Smoking status: Never    Passive exposure: Never   Smokeless tobacco: Never  Vaping Use   Vaping status: Never Used  Substance and Sexual Activity   Alcohol use: Not Currently    Alcohol/week: 2.0 standard drinks of alcohol    Types: 2 Cans of beer per week   Drug use: No   Sexual activity: Yes    Birth control/protection: None  Other Topics Concern   Not on file  Social History Narrative   Not on file   Social Drivers of Health   Financial Resource Strain: Medium Risk (05/24/2023)   Overall Financial Resource Strain (CARDIA)    Difficulty of Paying Living Expenses: Somewhat hard  Food Insecurity: Food Insecurity Present (06/07/2023)   Hunger Vital Sign    Worried About Running Out of Food in the Last Year:  Sometimes true    Ran Out of Food in the Last Year: Sometimes  true  Transportation Needs: No Transportation Needs (05/24/2023)   PRAPARE - Administrator, Civil Service (Medical): No    Lack of Transportation (Non-Medical): No  Physical Activity: Sufficiently Active (05/24/2023)   Exercise Vital Sign    Days of Exercise per Week: 7 days    Minutes of Exercise per Session: 30 min  Stress: No Stress Concern Present (05/24/2023)   [employer] of Occupational Health - Occupational Stress Questionnaire    Feeling of Stress : Not at all  Social Connections: Moderately Isolated (05/24/2023)   Social Connection and Isolation Panel [NHANES]    Frequency of Communication with Friends and Family: More than three times a week    Frequency of Social Gatherings with Friends and Family: More than three times a week    Attends Religious Services: Never    Database administrator or Organizations: No    Attends Banker Meetings: Never    Marital Status: Living with partner  Intimate Partner Violence: Not At Risk (05/24/2023)   Humiliation, Afraid, Rape, and Kick questionnaire    Fear of Current or Ex-Partner: No    Emotionally Abused: No    Physically Abused: No    Sexually Abused: No    Physical Exam      Future Appointments  Date Time Provider Department Center  02/04/2024  9:40 AM Ivonne Andrew, NP [facility] None  03/04/2024  8:00 AM Quintella Reichert, MD [facility]  03/05/2024  7:40 AM [facility] DEVICE REMOTES [facility]  05/29/2024 12:30 PM [facility]-ANNUAL WELLNESS VISIT [facility] None  06/05/2024  7:40 AM [facility] DEVICE REMOTES [facility]  09/05/2024  7:40 AM [facility] DEVICE REMOTES [facility]

## 2024-01-24 NOTE — Telephone Encounter (Signed)
 [name] is needing refills ordered for his Ozempic- this is normally done by [facility] Pharmacist Olene Floss, RPH-CPP. I will forward to have her assist.   [name], EMT-Paramedic [phone] 01/24/2024

## 2024-01-25 ENCOUNTER — Other Ambulatory Visit: Payer: Self-pay | Admitting: Nurse Practitioner

## 2024-01-25 NOTE — Telephone Encounter (Signed)
 Please advise [facility]

## 2024-01-28 ENCOUNTER — Other Ambulatory Visit: Payer: Self-pay

## 2024-01-30 ENCOUNTER — Telehealth (HOSPITAL_COMMUNITY): Payer: Self-pay

## 2024-01-30 NOTE — Telephone Encounter (Signed)
 Attempted to reach [place] to set up our weekly paramedicine visit with no success. I will follow up.   [name], EMT-Paramedic [phone] 01/30/2024

## 2024-02-04 ENCOUNTER — Encounter: Payer: Self-pay | Admitting: Nurse Practitioner

## 2024-02-04 ENCOUNTER — Ambulatory Visit (INDEPENDENT_AMBULATORY_CARE_PROVIDER_SITE_OTHER): Payer: Self-pay | Admitting: Nurse Practitioner

## 2024-02-04 VITALS — BP 149/93 | HR 63 | Temp 97.9°F | Wt 279.3 lb

## 2024-02-04 DIAGNOSIS — Z13220 Encounter for screening for lipoid disorders: Secondary | ICD-10-CM | POA: Diagnosis not present

## 2024-02-04 DIAGNOSIS — F419 Anxiety disorder, unspecified: Secondary | ICD-10-CM

## 2024-02-04 DIAGNOSIS — Z794 Long term (current) use of insulin: Secondary | ICD-10-CM

## 2024-02-04 DIAGNOSIS — E1165 Type 2 diabetes mellitus with hyperglycemia: Secondary | ICD-10-CM | POA: Diagnosis not present

## 2024-02-04 LAB — POCT GLYCOSYLATED HEMOGLOBIN (HGB A1C): Hemoglobin A1C: 6.9 % — AB (ref 4.0–5.6)

## 2024-02-04 MED ORDER — BUSPIRONE HCL 10 MG PO TABS: 10.0000 mg | ORAL_TABLET | Freq: Two times a day (BID) | ORAL | 2 refills | Status: DC

## 2024-02-04 NOTE — Progress Notes (Signed)
 Subjective   Patient ID: [name], male    DOB: 12/04/1960, 63 y.o.   MRN: [id number]  Chief Complaint  Patient presents with   Diabetes    Follow up     Referring provider: Ivonne Andrew, NP  [name] is a 63 y.o. male with Past Medical History: No date: AICD (automatic cardioverter/defibrillator) present No date: Anxiety No date: Atrial fibrillation (HCC) No date: CAD (coronary artery disease) No date: Cardiomyopathy (HCC) 09/2019: CHF (congestive heart failure) (HCC) No date: Depression No date: Diabetes mellitus 11/2019: Erectile dysfunction No date: GI bleeding 09/2019: H/O right heart catheterization No date: Hypertension No date: Presence of permanent cardiac pacemaker 10/27/2021: Presence of Watchman left atrial appendage closure device     Comment:  27 mm Watchman Flex Device per Dr. Lalla Brothers No date: Schizophrenia (HCC) No date: Sleep apnea     Comment:  uses cpap   HPI  Patient presents today for diabetes follow-up.  A1c in office today is 6.9.  Pharmacist is following patient for diabetic medication management.  Patient states that he has not been compliant with diet.  He states that he will do better about limiting concentrated sweets.  Blood pressure was slightly elevated in office today.  Patient states that he did eat to bacon sandwiches last night.. Denies chest pain or edema.   Complains of anxiety. Is working on getting an appointment with psychiatry. Is already on lexpro and Cymbalta. Will trial buspar until he gets his psychiatry appointment. Admits that he did go to a party this weekend and did marijuana gummies. This could be contributing to increased anxiety.   No Known Allergies  Immunization History  Administered Date(s) Administered   Influenza,inj,Quad PF,6+ Mos 10/13/2019, 09/13/2020, 08/03/2021, 11/01/2022   PFIZER(Purple Top)SARS-COV-2 Vaccination 03/13/2020   Pneumococcal Polysaccharide-23 10/16/2019   Tdap  09/13/2020   Zoster Recombinant(Shingrix) 08/03/2021    Tobacco History: Social History   Tobacco Use  Smoking Status Never   Passive exposure: Never  Smokeless Tobacco Never   Counseling given: Not Answered   Outpatient Encounter Medications as of 02/04/2024  Medication Sig   acetaminophen (TYLENOL) 500 MG tablet Take 2 tablets (1,000 mg total) by mouth 2 (two) times daily as needed. (Patient taking differently: Take 1,000 mg by mouth 2 (two) times daily as needed. Takes 1000mg  morning and night.)   albuterol (VENTOLIN HFA) 108 (90 Base) MCG/ACT inhaler Inhale 2 puffs into the lungs every 6 (six) hours as needed for wheezing or shortness of breath.   aspirin EC 81 MG tablet Take 81 mg by mouth daily. Swallow whole.   atorvastatin (LIPITOR) 40 MG tablet Take 1 tablet (40 mg total) by mouth daily.   blood glucose meter kit and supplies KIT Dispense based on patient and insurance preference. Use up to four times daily as directed. (FOR ICD-9 250.00, 250.01).   busPIRone (BUSPAR) 10 MG tablet Take 1 tablet (10 mg total) by mouth 2 (two) times daily.   colchicine 0.6 MG tablet Daily for 7 days then daily PRN for gout flare   Continuous Glucose Receiver (FREESTYLE LIBRE 3 READER) DEVI 1 each by Does not apply route daily. Use to check glucose continuously   Continuous Glucose Sensor (FREESTYLE LIBRE 3 SENSOR) MISC Place 1 sensor on the skin every 14 days. Use to check glucose continuously   DULoxetine (CYMBALTA) 30 MG capsule Take 1 capsule by mouth once daily   empagliflozin (JARDIANCE) 10 MG TABS tablet TAKE 1 TABLET BY MOUTH  ONCE DAILY .   ENTRESTO 24-26 MG TAKE 1 TABLET BY MOUTH TWICE DAILY *FOLLOW UP APPOINTMENT FOR MORE REFILLS*   escitalopram (LEXAPRO) 10 MG tablet Take 10 mg by mouth daily. Prescribed by Dr. Elvera Lennox. Dimkpa   ferrous sulfate 325 (65 FE) MG tablet Take 1 tablet (325 mg total) by mouth every other day.   furosemide (LASIX) 40 MG tablet Take 1.5 tablets (60 mg total) by mouth  2 (two) times daily.   gabapentin (NEURONTIN) 800 MG tablet TAKE 1 TABLET BY MOUTH THREE TIMES DAILY   HUMALOG MIX 75/25 KWIKPEN (75-25) 100 UNIT/ML KwikPen DIAL 30 UNITS AND INJECT UNDER THE SKIN TWICE DAILY. MAX DAILY DOSE: 60 UNITS.   metFORMIN (GLUCOPHAGE-XR) 500 MG 24 hr tablet Take 1 tablet (500 mg total) by mouth 2 (two) times daily with a meal.   metoprolol succinate (TOPROL-XL) 50 MG 24 hr tablet Take 1.5 tablets (75 mg total) by mouth daily.   Multiple Vitamins-Minerals (CENTRUM SILVER 50+MEN) TABS Take 1 tablet by mouth daily.   nicotine (NICODERM CQ - DOSED IN MG/24 HOURS) 14 mg/24hr patch Place 1 patch (14 mg total) onto the skin daily.   nicotine (NICODERM CQ - DOSED IN MG/24 HOURS) 21 mg/24hr patch Place 1 patch (21 mg total) onto the skin daily.   Omega-3 Fatty Acids (FISH OIL) 1000 MG CAPS Take 1,000 mg by mouth at bedtime.   pantoprazole (PROTONIX) 40 MG tablet Take 1 tablet by mouth once daily   potassium chloride SA (KLOR-CON M) 20 MEQ tablet Take 2 tablets (40 mEq total) by mouth daily.   QUEtiapine (SEROQUEL) 300 MG tablet Take 1.5 tablets (450 mg total) by mouth at bedtime.   RELION PEN NEEDLES 31G X 6 MM MISC INJECT 1 PEN INTO THE SKIN 2 TIMES DAILY   Semaglutide, 2 MG/DOSE, (OZEMPIC, 2 MG/DOSE,) 8 MG/3ML SOPN Inject 2 mg into the skin once a week. Monday   sildenafil (VIAGRA) 100 MG tablet TAKE 1/2 TO 1 (ONE-HALF TO ONE) TABLET BY MOUTH ONCE DAILY AS NEEDED FOR ERECTILE DYSFUNCTION   spironolactone (ALDACTONE) 25 MG tablet Take 1 tablet (25 mg total) by mouth daily.   tadalafil (CIALIS) 20 MG tablet Take 20 mg by mouth daily as needed.   Vitamin D, Ergocalciferol, (DRISDOL) 1.25 MG (50000 UNIT) CAPS capsule Take 1 capsule by mouth once a week   zinc gluconate 50 MG tablet Take 50 mg by mouth daily.   diclofenac Sodium (VOLTAREN) 1 % GEL Apply 2 g topically daily as needed for pain. (Patient not taking: Reported on 02/04/2024)   nicotine (NICODERM CQ - DOSED IN MG/24 HR) 7  mg/24hr patch Place 1 patch (7 mg total) onto the skin daily. (Patient not taking: Reported on 01/24/2024)   No facility-administered encounter medications on file as of 02/04/2024.    Review of Systems  Review of Systems  Constitutional: Negative.   HENT: Negative.    Cardiovascular: Negative.   Gastrointestinal: Negative.   Allergic/Immunologic: Negative.   Neurological: Negative.   Psychiatric/Behavioral: Negative.       Objective:   BP (!) 149/93   Pulse 63   Temp 97.9 F (36.6 C)   Wt 279 lb 4.8 oz (126.7 kg)   SpO2 100%   BMI 35.86 kg/m   Wt Readings from Last 5 Encounters:  02/04/24 279 lb 4.8 oz (126.7 kg)  01/24/24 275 lb (124.7 kg)  01/14/24 275 lb (124.7 kg)  01/01/24 278 lb 9.6 oz (126.4 kg)  12/26/23 277 lb (  125.6 kg)     Physical Exam Vitals and nursing note reviewed.  Constitutional:      General: He is not in acute distress.    Appearance: He is well-developed.  Cardiovascular:     Rate and Rhythm: Normal rate and regular rhythm.  Pulmonary:     Effort: Pulmonary effort is normal.     Breath sounds: Normal breath sounds.  Skin:    General: Skin is warm and dry.  Neurological:     Mental Status: He is alert and oriented to person, place, and time.         Assessment & Plan:   Type 2 diabetes mellitus with hyperglycemia, with long-term current use of insulin (HCC) -     POCT UA - Microalbumin -     POCT glycosylated hemoglobin (Hb A1C) -     CBC -     Comprehensive metabolic panel  Anxiety -     busPIRone HCl; Take 1 tablet (10 mg total) by mouth 2 (two) times daily.  Dispense: 60 tablet; Refill: 2  Lipid screening -     Lipid panel     Return in about 3 months (around 05/06/2024).   Ivonne Andrew, NP 02/04/2024

## 2024-02-04 NOTE — Patient Instructions (Addendum)
 1. Type 2 diabetes mellitus with hyperglycemia, with long-term current use of insulin (HCC) (Primary)  - POCT UA - Microalbumin - POCT glycosylated hemoglobin (Hb A1C) - CBC - Comprehensive metabolic panel  2. Anxiety  - busPIRone (BUSPAR) 10 MG tablet; Take 1 tablet (10 mg total) by mouth 2 (two) times daily.  Dispense: 60 tablet; Refill: 2  3. Lipid screening  - Lipid Panel      [facility] (accepts Medicaid, Medicare, [employer], and/or Self-Pay) [facility]  [address], [address],  [place], [place] [address] [phone]  [facility]  [address] [place], [place] [address] [phone]  [facility]  [facility] [place] [address] [place], [place] [address] *Located next to [facility]  [phone]  [facility]  [address] [place], [place] [address]  *Located next to [employer] [phone]  [facility]  [address] [place], [place] [address] [phone]  [facility]  [address] [place], [place] [address]  [phone] *Located inside [facility]  [address] [place], [place] [address] [phone]  [facility] [address] [place], [place] [address] [phone] [address] [place], [place] [address] [phone]  [facility] [address] [place], [place] [address] [phone]  [facility]  [address] [place], [place] [address] [phone]  [facility]  [address] [place], [place] [address] [phone]

## 2024-02-05 LAB — COMPREHENSIVE METABOLIC PANEL
ALT: 21 IU/L (ref 0–44)
AST: 22 IU/L (ref 0–40)
Albumin: 3.9 g/dL (ref 3.9–4.9)
Alkaline Phosphatase: 100 IU/L (ref 44–121)
BUN/Creatinine Ratio: 11 (ref 10–24)
BUN: 10 mg/dL (ref 8–27)
Bilirubin Total: <0.2 mg/dL (ref 0.0–1.2)
CO2: 25 mmol/L (ref 20–29)
Calcium: 9.1 mg/dL (ref 8.6–10.2)
Chloride: 103 mmol/L (ref 96–106)
Creatinine, Ser: 0.92 mg/dL (ref 0.76–1.27)
Globulin, Total: 3.3 g/dL (ref 1.5–4.5)
Glucose: 95 mg/dL (ref 70–99)
Potassium: 3.6 mmol/L (ref 3.5–5.2)
Sodium: 143 mmol/L (ref 134–144)
Total Protein: 7.2 g/dL (ref 6.0–8.5)
eGFR: 93 mL/min/{1.73_m2} (ref >59)

## 2024-02-05 LAB — LIPID PANEL
Chol/HDL Ratio: 2.6 ratio (ref 0.0–5.0)
Cholesterol, Total: 118 mg/dL (ref 100–199)
HDL: 45 mg/dL (ref >39)
LDL Chol Calc (NIH): 55 mg/dL (ref 0–99)
Triglycerides: 93 mg/dL (ref 0–149)
VLDL Cholesterol Cal: 18 mg/dL (ref 5–40)

## 2024-02-05 LAB — MICROALBUMIN / CREATININE URINE RATIO
Creatinine, Urine: 103.6 mg/dL
Microalb/Creat Ratio: 49 mg/g{creat} — ABNORMAL HIGH (ref 0–29)
Microalbumin, Urine: 51 ug/mL

## 2024-02-05 LAB — CBC
Hematocrit: 47.4 % (ref 37.5–51.0)
Hemoglobin: 14.1 g/dL (ref 13.0–17.7)
MCH: 24.2 pg — ABNORMAL LOW (ref 26.6–33.0)
MCHC: 29.7 g/dL — ABNORMAL LOW (ref 31.5–35.7)
MCV: 81 fL (ref 79–97)
Platelets: 255 10*3/uL (ref 150–450)
RBC: 5.82 x10E6/uL — ABNORMAL HIGH (ref 4.14–5.80)
RDW: 16.4 % — ABNORMAL HIGH (ref 11.6–15.4)
WBC: 6.5 10*3/uL (ref 3.4–10.8)

## 2024-02-06 ENCOUNTER — Telehealth (HOSPITAL_COMMUNITY): Payer: Self-pay

## 2024-02-06 NOTE — Telephone Encounter (Signed)
 Called in the attempt to schedule home paramedicine visit but no answer LVM. I will follow up.   [name], EMT-Paramedic [phone] 02/06/2024

## 2024-02-07 ENCOUNTER — Other Ambulatory Visit: Payer: Self-pay | Admitting: Nurse Practitioner

## 2024-02-07 ENCOUNTER — Other Ambulatory Visit (HOSPITAL_COMMUNITY): Payer: Self-pay

## 2024-02-07 ENCOUNTER — Other Ambulatory Visit (HOSPITAL_COMMUNITY): Payer: Self-pay | Admitting: Cardiology

## 2024-02-07 DIAGNOSIS — E1165 Type 2 diabetes mellitus with hyperglycemia: Secondary | ICD-10-CM

## 2024-02-07 NOTE — Progress Notes (Signed)
 Paramedicine Encounter    Patient ID: [name], male    DOB: August 14, 1961, 63 y.o.   MRN: [id number]     Met [name] for a med rec today as he missed our scheduled appointment earlier this week and was difficult to reach by phone. I plan to complete a full home visit on Monday. I filled pill box until Monday morning. He has several refills he never picked up. I discussed importance of program and med compliance and he verbalized understanding. Home visit complete.   [name], EMT-Paramedic [phone] 02/07/2024     ACTION: Home visit completed     Patient Care Team: Ivonne Andrew, NP as PCP - General (Pulmonary Disease) Quintella Reichert, MD as PCP - Cardiology (Cardiology) Laurey Morale, MD as PCP - Advanced Heart Failure (Cardiology) Lanier Prude, MD as PCP - Electrophysiology (Cardiology) Burna Sis, LCSW as Social Worker (Licensed Clinical Social Worker) Tressia Danas, MD (Inactive) as Consulting Physician (Gastroenterology)  Patient Active Problem List   Diagnosis Date Noted   PVC's (premature ventricular contractions) 09/28/2023   Acute right-sided low back pain with right-sided sciatica 05/14/2023   Multiple joint pain 12/22/2022   Obesity (BMI 35.0-39.9 without comorbidity) 07/18/2022   Cellulitis 06/22/2022   Cellulitis, leg 06/20/2022   Presence of Watchman left atrial appendage closure device 10/28/2021   Atrial fibrillation (HCC) 10/27/2021   Melena    Chronic diastolic CHF (congestive heart failure) (HCC)    GI bleed 06/29/2021   Severe anemia 06/29/2021   Adenomatous polyp of ascending colon    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   Acute blood loss anemia    Angiodysplasia of stomach    Chronic anticoagulation    CHF exacerbation (HCC) 06/15/2020   AKI (acute kidney injury) (HCC) 06/15/2020   CKD (chronic kidney disease), stage III (HCC) 06/15/2020   Anemia due to chronic blood loss     Gastric AVM    Angiodysplasia of duodenum with hemorrhage    Chronic systolic CHF (congestive heart failure) (HCC) 04/05/2020   Anemia 04/05/2020   PAF (paroxysmal atrial fibrillation) (HCC) 04/05/2020   OSA (obstructive sleep apnea) 04/05/2020   Syncope and collapse 04/05/2020   Type 2 diabetes mellitus with hyperglycemia, with long-term current use of insulin (HCC) 12/03/2019   Diabetic polyneuropathy associated with type 2 diabetes mellitus (HCC) 12/03/2019   Erectile dysfunction 12/03/2019   Paranoid schizophrenia, chronic condition (HCC) 01/26/2015   Severe recurrent major depressive disorder with psychotic features (HCC) 01/26/2015   GAD (generalized anxiety disorder) 01/26/2015   OCD (obsessive compulsive disorder) 01/26/2015   Panic disorder without agoraphobia 01/26/2015   Insomnia 01/26/2015    Current Outpatient Medications:    acetaminophen (TYLENOL) 500 MG tablet, Take 2 tablets (1,000 mg total) by mouth 2 (two) times daily as needed. (Patient taking differently: Take 1,000 mg by mouth 2 (two) times daily as needed. Takes 1000mg  morning and night.), Disp: 180 tablet, Rfl: 3   albuterol (VENTOLIN HFA) 108 (90 Base) MCG/ACT inhaler, Inhale 2 puffs into the lungs every 6 (six) hours as needed for wheezing or shortness of breath., Disp: 18 g, Rfl: 1   aspirin EC 81 MG tablet, Take 81 mg by mouth daily. Swallow whole., Disp: , Rfl:    atorvastatin (LIPITOR) 40 MG tablet, Take 1 tablet (40 mg total) by mouth daily., Disp: 90 tablet, Rfl: 3   blood glucose meter kit and supplies KIT, Dispense based on patient and insurance preference. Use up to four  times daily as directed. (FOR ICD-9 250.00, 250.01)., Disp: 1 each, Rfl: 0   busPIRone (BUSPAR) 10 MG tablet, Take 1 tablet (10 mg total) by mouth 2 (two) times daily., Disp: 60 tablet, Rfl: 2   colchicine 0.6 MG tablet, Daily for 7 days then daily PRN for gout flare, Disp: 30 tablet, Rfl: 0   Continuous Glucose Receiver (FREESTYLE LIBRE 3  READER) DEVI, 1 each by Does not apply route daily. Use to check glucose continuously, Disp: 1 each, Rfl: 1   Continuous Glucose Sensor (FREESTYLE LIBRE 3 SENSOR) MISC, Place 1 sensor on the skin every 14 days. Use to check glucose continuously, Disp: 6 each, Rfl: 2   diclofenac Sodium (VOLTAREN) 1 % GEL, Apply 2 g topically daily as needed for pain. (Patient not taking: Reported on 02/04/2024), Disp: , Rfl:    DULoxetine (CYMBALTA) 30 MG capsule, Take 1 capsule by mouth once daily, Disp: 30 capsule, Rfl: 2   empagliflozin (JARDIANCE) 10 MG TABS tablet, TAKE 1 TABLET BY MOUTH ONCE DAILY ., Disp: 90 tablet, Rfl: 3   ENTRESTO 24-26 MG, TAKE 1 TABLET BY MOUTH TWICE DAILY *FOLLOW UP APPOINTMENT FOR MORE REFILLS*, Disp: 180 tablet, Rfl: 0   escitalopram (LEXAPRO) 10 MG tablet, Take 10 mg by mouth daily. Prescribed by Dr. Elvera Lennox. Dimkpa, Disp: , Rfl:    ferrous sulfate 325 (65 FE) MG tablet, Take 1 tablet (325 mg total) by mouth every other day., Disp: 30 tablet, Rfl: 1   furosemide (LASIX) 40 MG tablet, Take 1.5 tablets (60 mg total) by mouth 2 (two) times daily., Disp: 200 tablet, Rfl: 5   gabapentin (NEURONTIN) 800 MG tablet, TAKE 1 TABLET BY MOUTH THREE TIMES DAILY, Disp: 90 tablet, Rfl: 0   HUMALOG MIX 75/25 KWIKPEN (75-25) 100 UNIT/ML KwikPen, DIAL 30 UNITS AND INJECT UNDER THE SKIN TWICE DAILY. MAX DAILY DOSE: 60 UNITS., Disp: 60 mL, Rfl: 0   metFORMIN (GLUCOPHAGE-XR) 500 MG 24 hr tablet, Take 1 tablet (500 mg total) by mouth 2 (two) times daily with a meal., Disp: 180 tablet, Rfl: 1   metoprolol succinate (TOPROL-XL) 50 MG 24 hr tablet, Take 1.5 tablets (75 mg total) by mouth daily., Disp: 45 tablet, Rfl: 11   Multiple Vitamins-Minerals (CENTRUM SILVER 50+MEN) TABS, Take 1 tablet by mouth daily., Disp: , Rfl:    nicotine (NICODERM CQ - DOSED IN MG/24 HOURS) 14 mg/24hr patch, Place 1 patch (14 mg total) onto the skin daily., Disp: 28 patch, Rfl: 0   nicotine (NICODERM CQ - DOSED IN MG/24 HOURS) 21 mg/24hr  patch, Place 1 patch (21 mg total) onto the skin daily., Disp: 28 patch, Rfl: 0   nicotine (NICODERM CQ - DOSED IN MG/24 HR) 7 mg/24hr patch, Place 1 patch (7 mg total) onto the skin daily. (Patient not taking: Reported on 01/24/2024), Disp: 28 patch, Rfl: 0   Omega-3 Fatty Acids (FISH OIL) 1000 MG CAPS, Take 1,000 mg by mouth at bedtime., Disp: , Rfl:    pantoprazole (PROTONIX) 40 MG tablet, Take 1 tablet by mouth once daily, Disp: 90 tablet, Rfl: 0   potassium chloride SA (KLOR-CON M) 20 MEQ tablet, Take 2 tablets (40 mEq total) by mouth daily., Disp: 60 tablet, Rfl: 6   QUEtiapine (SEROQUEL) 300 MG tablet, Take 1.5 tablets (450 mg total) by mouth at bedtime., Disp: 135 tablet, Rfl: 1   RELION PEN NEEDLES 31G X 6 MM MISC, INJECT 1 PEN INTO THE SKIN 2 TIMES DAILY, Disp: 100 each, Rfl: 0  Semaglutide, 2 MG/DOSE, (OZEMPIC, 2 MG/DOSE,) 8 MG/3ML SOPN, Inject 2 mg into the skin once a week. Monday, Disp: , Rfl:    sildenafil (VIAGRA) 100 MG tablet, TAKE 1/2 TO 1 (ONE-HALF TO ONE) TABLET BY MOUTH ONCE DAILY AS NEEDED FOR ERECTILE DYSFUNCTION, Disp: 5 tablet, Rfl: 0   spironolactone (ALDACTONE) 25 MG tablet, Take 1 tablet (25 mg total) by mouth daily., Disp: 90 tablet, Rfl: 3   tadalafil (CIALIS) 20 MG tablet, Take 20 mg by mouth daily as needed., Disp: , Rfl:    Vitamin D, Ergocalciferol, (DRISDOL) 1.25 MG (50000 UNIT) CAPS capsule, Take 1 capsule by mouth once a week, Disp: 5 capsule, Rfl: 0   zinc gluconate 50 MG tablet, Take 50 mg by mouth daily., Disp: , Rfl:  No Known Allergies   Social History   Socioeconomic History   Marital status: Legally Separated    Spouse name: Not on file   Number of children: Not on file   Years of education: Not on file   Highest education level: Not on file  Occupational History   Not on file  Tobacco Use   Smoking status: Never    Passive exposure: Never   Smokeless tobacco: Never  Vaping Use   Vaping status: Never Used  Substance and Sexual Activity    Alcohol use: Not Currently    Alcohol/week: 2.0 standard drinks of alcohol    Types: 2 Cans of beer per week   Drug use: No   Sexual activity: Yes    Birth control/protection: None  Other Topics Concern   Not on file  Social History Narrative   Not on file   Social Drivers of Health   Financial Resource Strain: Medium Risk (05/24/2023)   Overall Financial Resource Strain (CARDIA)    Difficulty of Paying Living Expenses: Somewhat hard  Food Insecurity: No Food Insecurity (02/04/2024)   Hunger Vital Sign    Worried About Running Out of Food in the Last Year: Never true    Ran Out of Food in the Last Year: Never true  Transportation Needs: No Transportation Needs (05/24/2023)   PRAPARE - Administrator, Civil Service (Medical): No    Lack of Transportation (Non-Medical): No  Physical Activity: Sufficiently Active (05/24/2023)   Exercise Vital Sign    Days of Exercise per Week: 7 days    Minutes of Exercise per Session: 30 min  Stress: No Stress Concern Present (05/24/2023)   [employer] of Occupational Health - Occupational Stress Questionnaire    Feeling of Stress : Not at all  Social Connections: Moderately Isolated (05/24/2023)   Social Connection and Isolation Panel [NHANES]    Frequency of Communication with Friends and Family: More than three times a week    Frequency of Social Gatherings with Friends and Family: More than three times a week    Attends Religious Services: Never    Database administrator or Organizations: No    Attends Banker Meetings: Never    Marital Status: Living with partner  Intimate Partner Violence: Not At Risk (05/24/2023)   Humiliation, Afraid, Rape, and Kick questionnaire    Fear of Current or Ex-Partner: No    Emotionally Abused: No    Physically Abused: No    Sexually Abused: No    Physical Exam      Future Appointments  Date Time Provider Department Center  03/04/2024  8:00 AM Quintella Reichert, MD  [facility]  03/05/2024  7:40  AM [facility] DEVICE REMOTES [facility]  05/12/2024  9:40 AM Ivonne Andrew, NP [facility] None  05/29/2024 12:30 PM [facility]-ANNUAL WELLNESS VISIT [facility] None  06/05/2024  7:40 AM [facility] DEVICE REMOTES [facility]  09/05/2024  7:40 AM [facility] DEVICE REMOTES [facility]

## 2024-02-11 ENCOUNTER — Other Ambulatory Visit (HOSPITAL_COMMUNITY): Payer: Self-pay

## 2024-02-11 NOTE — Progress Notes (Signed)
 Paramedicine Encounter    Patient ID: [name], male    DOB: [birth year], 63 y.o.   MRN: [id number]   Complaints- short of breath on exertion, fatigued.   Assessment- CAOX4, warm and dry seated in his kitchen reporting to be feeling drowsy and fatigued. He says he just woke up but feels like he is retaining fluid. No lower leg swelling, no abdominal distention but weight is up 6 lbs from Thursday however he never picked up his lasix to add to his pill box as instructed so he has been without it for over a week. I advised him to pick up today and I would come back and place in his pill box. He agreed. Lungs clear. CBG- 171   Compliance with meds- took what I placed in pill box on Thursday but did not go get all meds that we're missing as instructed.   Pill box filled- for one week   Refills needed- lasix and metformin  Meds changes since last visit- none     Social changes- none    VISIT SUMMARY- Arrived for home visit for [name] who reports to be feeling fatigued. He just woke up. He says he had a "wild" weekend but did not elaborate. House smells of marijuana. Pack of cigarettes on his table.  He denies smoking. I obtained assessment and vitals as noted. He did not pick up all meds I told him to get to fill his pill box until today. He has been without his metformin, lasix for over a week. CBG today 171. He has been taking his humalog but has not had ozempic as we are in the process of getting his patient assitance processed. No lower leg swelling, no abdominal bloating, lungs clear. He did have a 6lb weight gain but has not been med compliant. I reviewed HF management with him today and we discussed same. He verbalized understanding. Appointments reviewed. I advised him to pick up needed meds and I would come back out to reconcile his meds. He agreed. Home visit complete. I will follow up.   BP 128/68   Pulse 68   Resp 18   Wt 284 lb (128.8 kg)   SpO2 94%   BMI 36.46 kg/m   Weight yesterday-did not weigh  Last visit weight-279lbs      ACTION: Home visit completed     Patient Care Team: Ivonne Andrew, NP as PCP - General (Pulmonary Disease) Quintella Reichert, MD as PCP - Cardiology (Cardiology) Laurey Morale, MD as PCP - Advanced Heart Failure (Cardiology) Lanier Prude, MD as PCP - Electrophysiology (Cardiology) Burna Sis, LCSW as Social Worker (Licensed Clinical Social Worker) Tressia Danas, MD (Inactive) as Consulting Physician (Gastroenterology)  Patient Active Problem List   Diagnosis Date Noted   PVC's (premature ventricular contractions) 09/28/2023   Acute right-sided low back pain with right-sided sciatica 05/14/2023   Multiple joint pain 12/22/2022   Obesity (BMI 35.0-39.9 without comorbidity) 07/18/2022   Cellulitis 06/22/2022   Cellulitis, leg 06/20/2022   Presence of Watchman left atrial appendage closure device 10/28/2021   Atrial fibrillation (HCC) 10/27/2021   Melena    Chronic diastolic CHF (congestive heart failure) (HCC)    GI bleed 06/29/2021   Severe anemia 06/29/2021   Adenomatous polyp of ascending colon    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   Acute blood loss anemia    Angiodysplasia of stomach    Chronic anticoagulation    CHF exacerbation (HCC) 06/15/2020  AKI (acute kidney injury) (HCC) 06/15/2020   CKD (chronic kidney disease), stage III (HCC) 06/15/2020   Anemia due to chronic blood loss    Gastric AVM    Angiodysplasia of duodenum with hemorrhage    Chronic systolic CHF (congestive heart failure) (HCC) 04/05/2020   Anemia 04/05/2020   PAF (paroxysmal atrial fibrillation) (HCC) 04/05/2020   OSA (obstructive sleep apnea) 04/05/2020   Syncope and collapse 04/05/2020   Type 2 diabetes mellitus with hyperglycemia, with long-term current use of insulin (HCC) 12/03/2019   Diabetic polyneuropathy associated with type 2 diabetes mellitus (HCC) 12/03/2019   Erectile  dysfunction 12/03/2019   Paranoid schizophrenia, chronic condition (HCC) 01/26/2015   Severe recurrent major depressive disorder with psychotic features (HCC) 01/26/2015   GAD (generalized anxiety disorder) 01/26/2015   OCD (obsessive compulsive disorder) 01/26/2015   Panic disorder without agoraphobia 01/26/2015   Insomnia 01/26/2015    Current Outpatient Medications:    acetaminophen (TYLENOL) 500 MG tablet, Take 2 tablets (1,000 mg total) by mouth 2 (two) times daily as needed. (Patient taking differently: Take 1,000 mg by mouth 2 (two) times daily as needed. Takes 1000mg  morning and night.), Disp: 180 tablet, Rfl: 3   albuterol (VENTOLIN HFA) 108 (90 Base) MCG/ACT inhaler, Inhale 2 puffs into the lungs every 6 (six) hours as needed for wheezing or shortness of breath., Disp: 18 g, Rfl: 1   aspirin EC 81 MG tablet, Take 81 mg by mouth daily. Swallow whole., Disp: , Rfl:    atorvastatin (LIPITOR) 40 MG tablet, Take 1 tablet (40 mg total) by mouth daily., Disp: 90 tablet, Rfl: 3   busPIRone (BUSPAR) 10 MG tablet, Take 1 tablet (10 mg total) by mouth 2 (two) times daily., Disp: 60 tablet, Rfl: 2   colchicine 0.6 MG tablet, Daily for 7 days then daily PRN for gout flare, Disp: 30 tablet, Rfl: 0   Continuous Glucose Receiver (FREESTYLE LIBRE 3 READER) DEVI, 1 each by Does not apply route daily. Use to check glucose continuously, Disp: 1 each, Rfl: 1   Continuous Glucose Sensor (FREESTYLE LIBRE 3 SENSOR) MISC, Place 1 sensor on the skin every 14 days. Use to check glucose continuously, Disp: 6 each, Rfl: 2   diclofenac Sodium (VOLTAREN) 1 % GEL, Apply 2 g topically daily as needed for pain., Disp: , Rfl:    empagliflozin (JARDIANCE) 10 MG TABS tablet, TAKE 1 TABLET BY MOUTH ONCE DAILY ., Disp: 90 tablet, Rfl: 3   ENTRESTO 24-26 MG, TAKE 1 TABLET BY MOUTH TWICE DAILY *FOLLOW UP APPOINTMENT FOR MORE REFILLS*, Disp: 180 tablet, Rfl: 0   ferrous sulfate 325 (65 FE) MG tablet, Take 1 tablet (325 mg  total) by mouth every other day., Disp: 30 tablet, Rfl: 1   furosemide (LASIX) 40 MG tablet, TAKE 1 & 1/2 (ONE & ONE-HALF) TABLETS BY MOUTH TWICE DAILY, Disp: 90 tablet, Rfl: 0   gabapentin (NEURONTIN) 800 MG tablet, TAKE 1 TABLET BY MOUTH THREE TIMES DAILY, Disp: 90 tablet, Rfl: 0   HUMALOG MIX 75/25 KWIKPEN (75-25) 100 UNIT/ML KwikPen, DIAL 30 UNITS AND INJECT UNDER THE SKIN TWICE DAILY. MAX DAILY DOSE: 60 UNITS., Disp: 60 mL, Rfl: 0   metFORMIN (GLUCOPHAGE-XR) 500 MG 24 hr tablet, TAKE 1 TABLET BY MOUTH TWICE DAILY WITH A MEAL, Disp: 60 tablet, Rfl: 0   metoprolol succinate (TOPROL-XL) 50 MG 24 hr tablet, Take 1.5 tablets (75 mg total) by mouth daily., Disp: 45 tablet, Rfl: 11   Multiple Vitamins-Minerals (CENTRUM SILVER 50+MEN) TABS,  Take 1 tablet by mouth daily., Disp: , Rfl:    Omega-3 Fatty Acids (FISH OIL) 1000 MG CAPS, Take 1,000 mg by mouth at bedtime., Disp: , Rfl:    pantoprazole (PROTONIX) 40 MG tablet, Take 1 tablet by mouth once daily, Disp: 90 tablet, Rfl: 0   potassium chloride SA (KLOR-CON M) 20 MEQ tablet, Take 2 tablets (40 mEq total) by mouth daily., Disp: 60 tablet, Rfl: 6   QUEtiapine (SEROQUEL) 300 MG tablet, Take 1.5 tablets (450 mg total) by mouth at bedtime., Disp: 135 tablet, Rfl: 1   RELION PEN NEEDLES 31G X 6 MM MISC, INJECT 1 PEN INTO THE SKIN 2 TIMES DAILY, Disp: 100 each, Rfl: 0   sildenafil (VIAGRA) 100 MG tablet, TAKE 1/2 TO 1 (ONE-HALF TO ONE) TABLET BY MOUTH ONCE DAILY AS NEEDED FOR ERECTILE DYSFUNCTION, Disp: 5 tablet, Rfl: 0   spironolactone (ALDACTONE) 25 MG tablet, Take 1 tablet (25 mg total) by mouth daily., Disp: 90 tablet, Rfl: 3   Vitamin D, Ergocalciferol, (DRISDOL) 1.25 MG (50000 UNIT) CAPS capsule, Take 1 capsule by mouth once a week, Disp: 5 capsule, Rfl: 0   blood glucose meter kit and supplies KIT, Dispense based on patient and insurance preference. Use up to four times daily as directed. (FOR ICD-9 250.00, 250.01). (Patient not taking: Reported on  02/07/2024), Disp: 1 each, Rfl: 0   DULoxetine (CYMBALTA) 30 MG capsule, Take 1 capsule by mouth once daily (Patient not taking: Reported on 02/11/2024), Disp: 30 capsule, Rfl: 2   escitalopram (LEXAPRO) 10 MG tablet, Take 10 mg by mouth daily. Prescribed by Dr. Elvera Lennox. Dimkpa (Patient not taking: Reported on 02/11/2024), Disp: , Rfl:    nicotine (NICODERM CQ - DOSED IN MG/24 HOURS) 14 mg/24hr patch, Place 1 patch (14 mg total) onto the skin daily. (Patient not taking: Reported on 02/11/2024), Disp: 28 patch, Rfl: 0   nicotine (NICODERM CQ - DOSED IN MG/24 HOURS) 21 mg/24hr patch, Place 1 patch (21 mg total) onto the skin daily. (Patient not taking: Reported on 02/11/2024), Disp: 28 patch, Rfl: 0   nicotine (NICODERM CQ - DOSED IN MG/24 HR) 7 mg/24hr patch, Place 1 patch (7 mg total) onto the skin daily. (Patient not taking: Reported on 02/11/2024), Disp: 28 patch, Rfl: 0   Semaglutide, 2 MG/DOSE, (OZEMPIC, 2 MG/DOSE,) 8 MG/3ML SOPN, Inject 2 mg into the skin once a week. Monday (Patient not taking: Reported on 02/11/2024), Disp: , Rfl:    tadalafil (CIALIS) 20 MG tablet, Take 20 mg by mouth daily as needed. (Patient not taking: Reported on 02/11/2024), Disp: , Rfl:    zinc gluconate 50 MG tablet, Take 50 mg by mouth daily. (Patient not taking: Reported on 02/11/2024), Disp: , Rfl:  No Known Allergies   Social History   Socioeconomic History   Marital status: Legally Separated    Spouse name: Not on file   Number of children: Not on file   Years of education: Not on file   Highest education level: Not on file  Occupational History   Not on file  Tobacco Use   Smoking status: Never    Passive exposure: Never   Smokeless tobacco: Never  Vaping Use   Vaping status: Never Used  Substance and Sexual Activity   Alcohol use: Not Currently    Alcohol/week: 2.0 standard drinks of alcohol    Types: 2 Cans of beer per week   Drug use: No   Sexual activity: Yes    Birth control/protection: None  Other  Topics Concern   Not on file  Social History Narrative   Not on file   Social Drivers of Health   Financial Resource Strain: Medium Risk (05/24/2023)   Overall Financial Resource Strain (CARDIA)    Difficulty of Paying Living Expenses: Somewhat hard  Food Insecurity: No Food Insecurity (02/04/2024)   Hunger Vital Sign    Worried About Running Out of Food in the Last Year: Never true    Ran Out of Food in the Last Year: Never true  Transportation Needs: No Transportation Needs (05/24/2023)   PRAPARE - Administrator, Civil Service (Medical): No    Lack of Transportation (Non-Medical): No  Physical Activity: Sufficiently Active (05/24/2023)   Exercise Vital Sign    Days of Exercise per Week: 7 days    Minutes of Exercise per Session: 30 min  Stress: No Stress Concern Present (05/24/2023)   [employer] of Occupational Health - Occupational Stress Questionnaire    Feeling of Stress : Not at all  Social Connections: Moderately Isolated (05/24/2023)   Social Connection and Isolation Panel [NHANES]    Frequency of Communication with Friends and Family: More than three times a week    Frequency of Social Gatherings with Friends and Family: More than three times a week    Attends Religious Services: Never    Database administrator or Organizations: No    Attends Banker Meetings: Never    Marital Status: Living with partner  Intimate Partner Violence: Not At Risk (05/24/2023)   Humiliation, Afraid, Rape, and Kick questionnaire    Fear of Current or Ex-Partner: No    Emotionally Abused: No    Physically Abused: No    Sexually Abused: No    Physical Exam      Future Appointments  Date Time Provider Department Center  03/04/2024  8:00 AM Quintella Reichert, MD [facility]  03/05/2024  7:40 AM [facility] DEVICE REMOTES [facility]  05/12/2024  9:40 AM Ivonne Andrew, NP [facility] None  05/29/2024 12:30 PM [facility] WE[facility] VISIT  [facility] None  06/05/2024  7:40 AM [facility] DEVICE REMOTES [facility]  09/05/2024  7:40 AM [facility] DEVICE REMOTES [facility]

## 2024-02-13 ENCOUNTER — Telehealth (HOSPITAL_COMMUNITY): Payer: Self-pay

## 2024-02-13 NOTE — Telephone Encounter (Signed)
 [name] called me requesting Colchicine refill as he is experiencing a gout flare up and has none in the home. I will message PCP for same.   [name], EMT-Paramedic [phone] 02/13/2024

## 2024-02-15 ENCOUNTER — Other Ambulatory Visit: Payer: Self-pay | Admitting: Nurse Practitioner

## 2024-02-15 ENCOUNTER — Other Ambulatory Visit: Payer: Self-pay

## 2024-02-15 MED ORDER — COLCHICINE 0.6 MG PO TABS: ORAL_TABLET | ORAL | 0 refills | Status: DC

## 2024-02-18 ENCOUNTER — Other Ambulatory Visit (HOSPITAL_COMMUNITY): Payer: Self-pay

## 2024-02-18 NOTE — Progress Notes (Signed)
 Paramedicine Encounter    Patient ID: [name], male    DOB: 03-Oct-1961, 63 y.o.   MRN: [id number]   Complaints- chest pain yesterday on exertion   Assessment- CAOX4, warm and dry seated on couch, reporting having chest pain yesterday but says it's eased off. He denied shortness of breath, dizziness. No swelling. Vitals at baseline.   Compliance with meds- missed one night time and one noon dose   Pill box filled- for one week   Refills needed- none   Meds changes since last visit- none     Social changes- none   - Interested in Insprire device will see Traci Turner in two weeks.    VISIT SUMMARY- Arrived for home visit for [name] who reports to be feeling well but says his chest was hurting yesterday but he did not seek further medical care. I reviewed his medications and confirmed same filling his pill box for one week. He needed his freestyle sensor replaced- [facility] is processing it and he can pick up today. He knows how to replace on his own. I obtained vitals and assessment as noted. No swelling, no weight gain. He has one ozempic box and one pen left. I will process his paperwork this week for his patient assistance. Appointments reviewed and confirmed. Home visit complete.   BP 118/62   Pulse 84   Resp 16   Wt 280 lb (127 kg)   SpO2 95%   BMI 35.95 kg/m  Weight yesterday-- did not weigh  Last visit weight-- 284lbs      ACTION: Home visit completed     Patient Care Team: Ivonne Andrew, NP as PCP - General (Pulmonary Disease) Quintella Reichert, MD as PCP - Cardiology (Cardiology) Laurey Morale, MD as PCP - Advanced Heart Failure (Cardiology) Lanier Prude, MD as PCP - Electrophysiology (Cardiology) Burna Sis, LCSW as Social Worker (Licensed Clinical Social Worker) Tressia Danas, MD (Inactive) as Consulting Physician (Gastroenterology)  Patient Active Problem List   Diagnosis Date Noted   PVC's (premature ventricular contractions)  09/28/2023   Acute right-sided low back pain with right-sided sciatica 05/14/2023   Multiple joint pain 12/22/2022   Obesity (BMI 35.0-39.9 without comorbidity) 07/18/2022   Cellulitis 06/22/2022   Cellulitis, leg 06/20/2022   Presence of Watchman left atrial appendage closure device 10/28/2021   Atrial fibrillation (HCC) 10/27/2021   Melena    Chronic diastolic CHF (congestive heart failure) (HCC)    GI bleed 06/29/2021   Severe anemia 06/29/2021   Adenomatous polyp of ascending colon    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   Acute blood loss anemia    Angiodysplasia of stomach    Chronic anticoagulation    CHF exacerbation (HCC) 06/15/2020   AKI (acute kidney injury) (HCC) 06/15/2020   CKD (chronic kidney disease), stage III (HCC) 06/15/2020   Anemia due to chronic blood loss    Gastric AVM    Angiodysplasia of duodenum with hemorrhage    Chronic systolic CHF (congestive heart failure) (HCC) 04/05/2020   Anemia 04/05/2020   PAF (paroxysmal atrial fibrillation) (HCC) 04/05/2020   OSA (obstructive sleep apnea) 04/05/2020   Syncope and collapse 04/05/2020   Type 2 diabetes mellitus with hyperglycemia, with long-term current use of insulin (HCC) 12/03/2019   Diabetic polyneuropathy associated with type 2 diabetes mellitus (HCC) 12/03/2019   Erectile dysfunction 12/03/2019   Paranoid schizophrenia, chronic condition (HCC) 01/26/2015   Severe recurrent major depressive disorder with psychotic features (HCC) 01/26/2015  GAD (generalized anxiety disorder) 01/26/2015   OCD (obsessive compulsive disorder) 01/26/2015   Panic disorder without agoraphobia 01/26/2015   Insomnia 01/26/2015    Current Outpatient Medications:    acetaminophen (TYLENOL) 500 MG tablet, Take 2 tablets (1,000 mg total) by mouth 2 (two) times daily as needed. (Patient taking differently: Take 1,000 mg by mouth 2 (two) times daily as needed. Takes 1000mg  morning and night.), Disp: 180  tablet, Rfl: 3   albuterol (VENTOLIN HFA) 108 (90 Base) MCG/ACT inhaler, Inhale 2 puffs into the lungs every 6 (six) hours as needed for wheezing or shortness of breath., Disp: 18 g, Rfl: 1   aspirin EC 81 MG tablet, Take 81 mg by mouth daily. Swallow whole., Disp: , Rfl:    atorvastatin (LIPITOR) 40 MG tablet, Take 1 tablet (40 mg total) by mouth daily., Disp: 90 tablet, Rfl: 3   busPIRone (BUSPAR) 10 MG tablet, Take 1 tablet (10 mg total) by mouth 2 (two) times daily., Disp: 60 tablet, Rfl: 2   colchicine 0.6 MG tablet, Daily for 7 days then daily PRN for gout flare, Disp: 30 tablet, Rfl: 0   Continuous Glucose Receiver (FREESTYLE LIBRE 3 READER) DEVI, 1 each by Does not apply route daily. Use to check glucose continuously, Disp: 1 each, Rfl: 1   Continuous Glucose Sensor (FREESTYLE LIBRE 3 SENSOR) MISC, Place 1 sensor on the skin every 14 days. Use to check glucose continuously, Disp: 6 each, Rfl: 2   diclofenac Sodium (VOLTAREN) 1 % GEL, Apply 2 g topically daily as needed for pain., Disp: , Rfl:    empagliflozin (JARDIANCE) 10 MG TABS tablet, TAKE 1 TABLET BY MOUTH ONCE DAILY ., Disp: 90 tablet, Rfl: 3   ENTRESTO 24-26 MG, TAKE 1 TABLET BY MOUTH TWICE DAILY *FOLLOW UP APPOINTMENT FOR MORE REFILLS*, Disp: 180 tablet, Rfl: 0   ferrous sulfate 325 (65 FE) MG tablet, Take 1 tablet (325 mg total) by mouth every other day., Disp: 30 tablet, Rfl: 1   furosemide (LASIX) 40 MG tablet, TAKE 1 & 1/2 (ONE & ONE-HALF) TABLETS BY MOUTH TWICE DAILY, Disp: 90 tablet, Rfl: 0   gabapentin (NEURONTIN) 800 MG tablet, TAKE 1 TABLET BY MOUTH THREE TIMES DAILY, Disp: 90 tablet, Rfl: 0   HUMALOG MIX 75/25 KWIKPEN (75-25) 100 UNIT/ML KwikPen, DIAL 30 UNITS AND INJECT UNDER THE SKIN TWICE DAILY. MAX DAILY DOSE: 60 UNITS., Disp: 60 mL, Rfl: 0   metFORMIN (GLUCOPHAGE-XR) 500 MG 24 hr tablet, TAKE 1 TABLET BY MOUTH TWICE DAILY WITH A MEAL, Disp: 60 tablet, Rfl: 0   metoprolol succinate (TOPROL-XL) 50 MG 24 hr tablet, Take  1.5 tablets (75 mg total) by mouth daily., Disp: 45 tablet, Rfl: 11   Multiple Vitamins-Minerals (CENTRUM SILVER 50+MEN) TABS, Take 1 tablet by mouth daily., Disp: , Rfl:    Omega-3 Fatty Acids (FISH OIL) 1000 MG CAPS, Take 1,000 mg by mouth at bedtime., Disp: , Rfl:    pantoprazole (PROTONIX) 40 MG tablet, Take 1 tablet by mouth once daily, Disp: 90 tablet, Rfl: 0   potassium chloride SA (KLOR-CON M) 20 MEQ tablet, Take 2 tablets (40 mEq total) by mouth daily., Disp: 60 tablet, Rfl: 6   QUEtiapine (SEROQUEL) 300 MG tablet, Take 1.5 tablets (450 mg total) by mouth at bedtime., Disp: 135 tablet, Rfl: 1   Semaglutide, 2 MG/DOSE, (OZEMPIC, 2 MG/DOSE,) 8 MG/3ML SOPN, Inject 2 mg into the skin once a week. Monday, Disp: , Rfl:    sildenafil (VIAGRA) 100 MG tablet,  TAKE 1/2 TO 1 (ONE-HALF TO ONE) TABLET BY MOUTH ONCE DAILY AS NEEDED FOR ERECTILE DYSFUNCTION, Disp: 5 tablet, Rfl: 0   spironolactone (ALDACTONE) 25 MG tablet, Take 1 tablet (25 mg total) by mouth daily., Disp: 90 tablet, Rfl: 3   Vitamin D, Ergocalciferol, (DRISDOL) 1.25 MG (50000 UNIT) CAPS capsule, Take 1 capsule by mouth once a week, Disp: 5 capsule, Rfl: 0   blood glucose meter kit and supplies KIT, Dispense based on patient and insurance preference. Use up to four times daily as directed. (FOR ICD-9 250.00, 250.01). (Patient not taking: Reported on 02/07/2024), Disp: 1 each, Rfl: 0   DULoxetine (CYMBALTA) 30 MG capsule, Take 1 capsule by mouth once daily (Patient not taking: Reported on 02/07/2024), Disp: 30 capsule, Rfl: 2   escitalopram (LEXAPRO) 10 MG tablet, Take 10 mg by mouth daily. Prescribed by Dr. Elvera Lennox. Dimkpa (Patient not taking: Reported on 02/11/2024), Disp: , Rfl:    nicotine (NICODERM CQ - DOSED IN MG/24 HOURS) 14 mg/24hr patch, Place 1 patch (14 mg total) onto the skin daily. (Patient not taking: Reported on 02/07/2024), Disp: 28 patch, Rfl: 0   nicotine (NICODERM CQ - DOSED IN MG/24 HOURS) 21 mg/24hr patch, Place 1 patch (21 mg  total) onto the skin daily. (Patient not taking: Reported on 02/07/2024), Disp: 28 patch, Rfl: 0   nicotine (NICODERM CQ - DOSED IN MG/24 HR) 7 mg/24hr patch, Place 1 patch (7 mg total) onto the skin daily. (Patient not taking: Reported on 02/07/2024), Disp: 28 patch, Rfl: 0   RELION PEN NEEDLES 31G X 6 MM MISC, INJECT 1 PEN INTO THE SKIN 2 TIMES DAILY, Disp: 100 each, Rfl: 0   tadalafil (CIALIS) 20 MG tablet, Take 20 mg by mouth daily as needed. (Patient not taking: Reported on 02/18/2024), Disp: , Rfl:    zinc gluconate 50 MG tablet, Take 50 mg by mouth daily. (Patient not taking: Reported on 02/11/2024), Disp: , Rfl:  No Known Allergies   Social History   Socioeconomic History   Marital status: Legally Separated    Spouse name: Not on file   Number of children: Not on file   Years of education: Not on file   Highest education level: Not on file  Occupational History   Not on file  Tobacco Use   Smoking status: Never    Passive exposure: Never   Smokeless tobacco: Never  Vaping Use   Vaping status: Never Used  Substance and Sexual Activity   Alcohol use: Not Currently    Alcohol/week: 2.0 standard drinks of alcohol    Types: 2 Cans of beer per week   Drug use: No   Sexual activity: Yes    Birth control/protection: None  Other Topics Concern   Not on file  Social History Narrative   Not on file   Social Drivers of Health   Financial Resource Strain: Medium Risk (05/24/2023)   Overall Financial Resource Strain (CARDIA)    Difficulty of Paying Living Expenses: Somewhat hard  Food Insecurity: No Food Insecurity (02/04/2024)   Hunger Vital Sign    Worried About Running Out of Food in the Last Year: Never true    Ran Out of Food in the Last Year: Never true  Transportation Needs: No Transportation Needs (05/24/2023)   PRAPARE - Administrator, Civil Service (Medical): No    Lack of Transportation (Non-Medical): No  Physical Activity: Sufficiently Active (05/24/2023)    Exercise Vital Sign    Days of  Exercise per Week: 7 days    Minutes of Exercise per Session: 30 min  Stress: No Stress Concern Present (05/24/2023)   [employer] of Occupational Health - Occupational Stress Questionnaire    Feeling of Stress : Not at all  Social Connections: Moderately Isolated (05/24/2023)   Social Connection and Isolation Panel [NHANES]    Frequency of Communication with Friends and Family: More than three times a week    Frequency of Social Gatherings with Friends and Family: More than three times a week    Attends Religious Services: Never    Database administrator or Organizations: No    Attends Banker Meetings: Never    Marital Status: Living with partner  Intimate Partner Violence: Not At Risk (05/24/2023)   Humiliation, Afraid, Rape, and Kick questionnaire    Fear of Current or Ex-Partner: No    Emotionally Abused: No    Physically Abused: No    Sexually Abused: No    Physical Exam      Future Appointments  Date Time Provider Department Center  03/04/2024  8:00 AM Quintella Reichert, MD [facility]  03/05/2024  7:40 AM [facility] DEVICE REMOTES [facility]  05/12/2024  9:40 AM Ivonne Andrew, NP [facility] None  05/29/2024 12:30 PM [facility]-ANNUAL WELLNESS VISIT [facility] None  06/05/2024  7:40 AM [facility] DEVICE REMOTES [facility]  09/05/2024  7:40 AM [facility] DEVICE REMOTES [facility]

## 2024-02-25 ENCOUNTER — Other Ambulatory Visit (HOSPITAL_COMMUNITY): Payer: Self-pay

## 2024-02-25 NOTE — Progress Notes (Signed)
 Paramedicine Encounter    Patient ID: [name], male    DOB: 22-Aug-1961, 63 y.o.   MRN: [id number]   Complaints-none   Assessment- CAOX4, warm and dry reporting to be feeling okay- working outside in the yard with some shortness of breath- no lower leg swelling, lungs clear. Vitals within range for him.   Compliance with meds- no missed doses   Pill box filled- for one week   Refills needed- metoprolol   Meds changes since last visit- Seroquel reduced to 200mg  nightly.      Social changes- Sees his parole officer on Wednesday at 3:00- will be off parole in September.    VISIT SUMMARY- Arrived for home visit for [name] who reports to be feeling okay with no complaints. He is mildly short of breath on exertion but this is not abnormal for him. He has been compliant with his meds. I obtained vitals and assessment as noted. CBG 72 on assessment- he had not eaten I advised him to do so. I reviewed HF and DM education with [name]. I reviewed meds and filled pill box for one week. I will call in refill for Metoprolol. Appointments reviewed. Home visit complete.    BP (!) 140/68   Pulse 77   Resp 16   Wt 281 lb (127.5 kg)   SpO2 98%   BMI 36.08 kg/m  Weight yesterday-did not weigh  Last visit weight-- 280lbs      ACTION: Home visit completed     Patient Care Team: Ivonne Andrew, NP as PCP - General (Pulmonary Disease) Quintella Reichert, MD as PCP - Cardiology (Cardiology) Laurey Morale, MD as PCP - Advanced Heart Failure (Cardiology) Lanier Prude, MD as PCP - Electrophysiology (Cardiology) Burna Sis, LCSW as Social Worker (Licensed Clinical Social Worker) Tressia Danas, MD (Inactive) as Consulting Physician (Gastroenterology)  Patient Active Problem List   Diagnosis Date Noted   PVC's (premature ventricular contractions) 09/28/2023   Acute right-sided low back pain with right-sided sciatica 05/14/2023   Multiple joint pain 12/22/2022    Obesity (BMI 35.0-39.9 without comorbidity) 07/18/2022   Cellulitis 06/22/2022   Cellulitis, leg 06/20/2022   Presence of Watchman left atrial appendage closure device 10/28/2021   Atrial fibrillation (HCC) 10/27/2021   Melena    Chronic diastolic CHF (congestive heart failure) (HCC)    GI bleed 06/29/2021   Severe anemia 06/29/2021   Adenomatous polyp of ascending colon    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   Acute blood loss anemia    Angiodysplasia of stomach    Chronic anticoagulation    CHF exacerbation (HCC) 06/15/2020   AKI (acute kidney injury) (HCC) 06/15/2020   CKD (chronic kidney disease), stage III (HCC) 06/15/2020   Anemia due to chronic blood loss    Gastric AVM    Angiodysplasia of duodenum with hemorrhage    Chronic systolic CHF (congestive heart failure) (HCC) 04/05/2020   Anemia 04/05/2020   PAF (paroxysmal atrial fibrillation) (HCC) 04/05/2020   OSA (obstructive sleep apnea) 04/05/2020   Syncope and collapse 04/05/2020   Type 2 diabetes mellitus with hyperglycemia, with long-term current use of insulin (HCC) 12/03/2019   Diabetic polyneuropathy associated with type 2 diabetes mellitus (HCC) 12/03/2019   Erectile dysfunction 12/03/2019   Paranoid schizophrenia, chronic condition (HCC) 01/26/2015   Severe recurrent major depressive disorder with psychotic features (HCC) 01/26/2015   GAD (generalized anxiety disorder) 01/26/2015   OCD (obsessive compulsive disorder) 01/26/2015   Panic disorder without agoraphobia  01/26/2015   Insomnia 01/26/2015    Current Outpatient Medications:    acetaminophen (TYLENOL) 500 MG tablet, Take 2 tablets (1,000 mg total) by mouth 2 (two) times daily as needed. (Patient taking differently: Take 1,000 mg by mouth 2 (two) times daily as needed. Takes 1000mg  morning and night.), Disp: 180 tablet, Rfl: 3   albuterol (VENTOLIN HFA) 108 (90 Base) MCG/ACT inhaler, Inhale 2 puffs into the lungs every 6 (six)  hours as needed for wheezing or shortness of breath., Disp: 18 g, Rfl: 1   aspirin EC 81 MG tablet, Take 81 mg by mouth daily. Swallow whole., Disp: , Rfl:    atorvastatin (LIPITOR) 40 MG tablet, Take 1 tablet (40 mg total) by mouth daily., Disp: 90 tablet, Rfl: 3   blood glucose meter kit and supplies KIT, Dispense based on patient and insurance preference. Use up to four times daily as directed. (FOR ICD-9 250.00, 250.01)., Disp: 1 each, Rfl: 0   busPIRone (BUSPAR) 10 MG tablet, Take 1 tablet (10 mg total) by mouth 2 (two) times daily., Disp: 60 tablet, Rfl: 2   colchicine 0.6 MG tablet, Daily for 7 days then daily PRN for gout flare, Disp: 30 tablet, Rfl: 0   Continuous Glucose Receiver (FREESTYLE LIBRE 3 READER) DEVI, 1 each by Does not apply route daily. Use to check glucose continuously, Disp: 1 each, Rfl: 1   Continuous Glucose Sensor (FREESTYLE LIBRE 3 SENSOR) MISC, Place 1 sensor on the skin every 14 days. Use to check glucose continuously, Disp: 6 each, Rfl: 2   diclofenac Sodium (VOLTAREN) 1 % GEL, Apply 2 g topically daily as needed for pain., Disp: , Rfl:    DULoxetine (CYMBALTA) 30 MG capsule, Take 1 capsule by mouth once daily (Patient not taking: Reported on 02/25/2024), Disp: 30 capsule, Rfl: 2   empagliflozin (JARDIANCE) 10 MG TABS tablet, TAKE 1 TABLET BY MOUTH ONCE DAILY ., Disp: 90 tablet, Rfl: 3   ENTRESTO 24-26 MG, TAKE 1 TABLET BY MOUTH TWICE DAILY *FOLLOW UP APPOINTMENT FOR MORE REFILLS*, Disp: 180 tablet, Rfl: 0   escitalopram (LEXAPRO) 10 MG tablet, Take 10 mg by mouth daily. Prescribed by Dr. Elvera Lennox. Dimkpa, Disp: , Rfl:    ferrous sulfate 325 (65 FE) MG tablet, Take 1 tablet (325 mg total) by mouth every other day., Disp: 30 tablet, Rfl: 1   furosemide (LASIX) 40 MG tablet, TAKE 1 & 1/2 (ONE & ONE-HALF) TABLETS BY MOUTH TWICE DAILY, Disp: 90 tablet, Rfl: 0   gabapentin (NEURONTIN) 800 MG tablet, TAKE 1 TABLET BY MOUTH THREE TIMES DAILY, Disp: 90 tablet, Rfl: 0   HUMALOG MIX  75/25 KWIKPEN (75-25) 100 UNIT/ML KwikPen, DIAL 30 UNITS AND INJECT UNDER THE SKIN TWICE DAILY. MAX DAILY DOSE: 60 UNITS., Disp: 60 mL, Rfl: 0   metFORMIN (GLUCOPHAGE-XR) 500 MG 24 hr tablet, TAKE 1 TABLET BY MOUTH TWICE DAILY WITH A MEAL, Disp: 60 tablet, Rfl: 0   metoprolol succinate (TOPROL-XL) 50 MG 24 hr tablet, Take 1.5 tablets (75 mg total) by mouth daily., Disp: 45 tablet, Rfl: 11   Multiple Vitamins-Minerals (CENTRUM SILVER 50+MEN) TABS, Take 1 tablet by mouth daily., Disp: , Rfl:    nicotine (NICODERM CQ - DOSED IN MG/24 HOURS) 14 mg/24hr patch, Place 1 patch (14 mg total) onto the skin daily., Disp: 28 patch, Rfl: 0   nicotine (NICODERM CQ - DOSED IN MG/24 HOURS) 21 mg/24hr patch, Place 1 patch (21 mg total) onto the skin daily., Disp: 28 patch, Rfl: 0  nicotine (NICODERM CQ - DOSED IN MG/24 HR) 7 mg/24hr patch, Place 1 patch (7 mg total) onto the skin daily., Disp: 28 patch, Rfl: 0   Omega-3 Fatty Acids (FISH OIL) 1000 MG CAPS, Take 1,000 mg by mouth at bedtime., Disp: , Rfl:    pantoprazole (PROTONIX) 40 MG tablet, Take 1 tablet by mouth once daily, Disp: 90 tablet, Rfl: 0   potassium chloride SA (KLOR-CON M) 20 MEQ tablet, Take 2 tablets (40 mEq total) by mouth daily., Disp: 60 tablet, Rfl: 6   QUEtiapine (SEROQUEL) 300 MG tablet, Take 1.5 tablets (450 mg total) by mouth at bedtime., Disp: 135 tablet, Rfl: 1   RELION PEN NEEDLES 31G X 6 MM MISC, INJECT 1 PEN INTO THE SKIN 2 TIMES DAILY, Disp: 100 each, Rfl: 0   Semaglutide, 2 MG/DOSE, (OZEMPIC, 2 MG/DOSE,) 8 MG/3ML SOPN, Inject 2 mg into the skin once a week. Monday, Disp: , Rfl:    sildenafil (VIAGRA) 100 MG tablet, TAKE 1/2 TO 1 (ONE-HALF TO ONE) TABLET BY MOUTH ONCE DAILY AS NEEDED FOR ERECTILE DYSFUNCTION, Disp: 5 tablet, Rfl: 0   spironolactone (ALDACTONE) 25 MG tablet, Take 1 tablet (25 mg total) by mouth daily., Disp: 90 tablet, Rfl: 3   tadalafil (CIALIS) 20 MG tablet, Take 20 mg by mouth daily as needed. (Patient not taking:  Reported on 02/25/2024), Disp: , Rfl:    Vitamin D, Ergocalciferol, (DRISDOL) 1.25 MG (50000 UNIT) CAPS capsule, Take 1 capsule by mouth once a week, Disp: 5 capsule, Rfl: 0   zinc gluconate 50 MG tablet, Take 50 mg by mouth daily. (Patient not taking: Reported on 02/25/2024), Disp: , Rfl:  No Known Allergies   Social History   Socioeconomic History   Marital status: Legally Separated    Spouse name: Not on file   Number of children: Not on file   Years of education: Not on file   Highest education level: Not on file  Occupational History   Not on file  Tobacco Use   Smoking status: Never    Passive exposure: Never   Smokeless tobacco: Never  Vaping Use   Vaping status: Never Used  Substance and Sexual Activity   Alcohol use: Not Currently    Alcohol/week: 2.0 standard drinks of alcohol    Types: 2 Cans of beer per week   Drug use: No   Sexual activity: Yes    Birth control/protection: None  Other Topics Concern   Not on file  Social History Narrative   Not on file   Social Drivers of Health   Financial Resource Strain: Medium Risk (05/24/2023)   Overall Financial Resource Strain (CARDIA)    Difficulty of Paying Living Expenses: Somewhat hard  Food Insecurity: No Food Insecurity (02/04/2024)   Hunger Vital Sign    Worried About Running Out of Food in the Last Year: Never true    Ran Out of Food in the Last Year: Never true  Transportation Needs: No Transportation Needs (05/24/2023)   PRAPARE - Administrator, Civil Service (Medical): No    Lack of Transportation (Non-Medical): No  Physical Activity: Sufficiently Active (05/24/2023)   Exercise Vital Sign    Days of Exercise per Week: 7 days    Minutes of Exercise per Session: 30 min  Stress: No Stress Concern Present (05/24/2023)   [employer] of Occupational Health - Occupational Stress Questionnaire    Feeling of Stress : Not at all  Social Connections: Moderately Isolated (05/24/2023)  Social  Connection and Isolation Panel [NHANES]    Frequency of Communication with Friends and Family: More than three times a week    Frequency of Social Gatherings with Friends and Family: More than three times a week    Attends Religious Services: Never    Database administrator or Organizations: No    Attends Banker Meetings: Never    Marital Status: Living with partner  Intimate Partner Violence: Not At Risk (05/24/2023)   Humiliation, Afraid, Rape, and Kick questionnaire    Fear of Current or Ex-Partner: No    Emotionally Abused: No    Physically Abused: No    Sexually Abused: No    Physical Exam      Future Appointments  Date Time Provider Department Center  03/04/2024  8:00 AM Quintella Reichert, MD [facility]  03/05/2024  7:40 AM [facility] DEVICE REMOTES [facility]  05/12/2024  9:40 AM Ivonne Andrew, NP [facility] None  05/29/2024 12:30 PM [facility]NUAL WELLNESS VISIT [facility] None  06/05/2024  7:40 AM [facility] DEVICE REMOTES [facility]  09/05/2024  7:40 AM [facility] DEVICE REMOTES [facility]

## 2024-02-26 NOTE — Telephone Encounter (Signed)
 Application faxed to [email or web address]- Signed by Dr. Mayford Knife

## 2024-02-29 NOTE — Telephone Encounter (Signed)
 Patient approved for patient assistance. Medication should be here in 10-14 days

## 2024-03-04 ENCOUNTER — Telehealth: Payer: Self-pay | Admitting: Radiology

## 2024-03-04 ENCOUNTER — Encounter: Payer: Self-pay | Admitting: Cardiology

## 2024-03-04 ENCOUNTER — Other Ambulatory Visit (HOSPITAL_COMMUNITY): Payer: Self-pay

## 2024-03-04 ENCOUNTER — Ambulatory Visit: Payer: Medicare Other | Attending: Cardiology | Admitting: Cardiology

## 2024-03-04 VITALS — BP 126/76 | HR 76 | Ht 74.0 in | Wt 279.0 lb

## 2024-03-04 DIAGNOSIS — I10 Essential (primary) hypertension: Secondary | ICD-10-CM

## 2024-03-04 DIAGNOSIS — G4733 Obstructive sleep apnea (adult) (pediatric): Secondary | ICD-10-CM | POA: Diagnosis not present

## 2024-03-04 NOTE — Progress Notes (Signed)
 Paramedicine Encounter    Patient ID: [name], male    DOB: [birth year], 63 y.o.   MRN: [id number]  Arrived to see [name] for a medication review and pill box reconcile. I reviewed meds and confirmed doses and filled pill box for one week. Refills as noted will be called into [facility]:  -seroquel  -buspirone  -lasix -entresto -potassium -gabapentin -metformin   I advised him to ask his psych doctor if he should be taking  -lexapro -cymbalta  As it is on his list but he has not gotten them filled.   Home visit complete. I will follow up in one week.   [name], EMT-Paramedic [phone] 03/04/2024   ACTION: Home visit completed

## 2024-03-04 NOTE — Telephone Encounter (Signed)
 1. Patient agreement reviewed and signed on 03/04/24. 2. WatchPAT issued to patient on 03/04/24 Katy S. Patient aware to not open the WatchPAT box until contacted with the activation PIN. 3. Patient profile initialized in CloudPAT on 03/04/24 4. Device serial number: [id number]

## 2024-03-04 NOTE — Addendum Note (Signed)
 Addended by: Erick Alley on: 03/04/2024 08:43 AM   Modules accepted: Orders

## 2024-03-04 NOTE — Patient Instructions (Signed)
 Medication Instructions:  Your physician recommends that you continue on your current medications as directed. Please refer to the Current Medication list given to you today.  *If you need a refill on your cardiac medications before your next appointment, please call your pharmacy*  Lab Work: NONE If you have labs (blood work) drawn today and your tests are completely normal, you will receive your results only by: MyChart Message (if you have MyChart) OR A paper copy in the mail If you have any lab test that is abnormal or we need to change your treatment, we will call you to review the results.  Testing/Procedures: [facility] Sleep Study  Follow-Up: At [facility], you and your health needs are our priority.  As part of our continuing mission to provide you with exceptional heart care, our providers are all part of one team.  This team includes your primary Cardiologist (physician) and Advanced Practice Providers or APPs (Physician Assistants and Nurse Practitioners) who all work together to provide you with the care you need, when you need it.  Your next appointment:   As needed  Provider:   Armanda Magic, MD     We recommend signing up for the patient portal called "MyChart".  Sign up information is provided on this After Visit Summary.  MyChart is used to connect with patients for Virtual Visits (Telemedicine).  Patients are able to view lab/test results, encounter notes, upcoming appointments, etc.  Non-urgent messages can be sent to your provider as well.   To learn more about what you can do with MyChart, go to [email or web address].   Other Instructions       1st Floor: - Lobby - Registration  - Pharmacy  - Lab - Cafe  2nd Floor: - PV Lab - Diagnostic Testing (echo, CT, nuclear med)  3rd Floor: - Vacant  4th Floor: - TCTS (cardiothoracic surgery) - AFib Clinic - Structural Heart Clinic - Vascular Surgery  - Vascular Ultrasound  5th Floor: -  HeartCare Cardiology (general and EP) - Clinical Pharmacy for coumadin, hypertension, lipid, weight-loss medications, and med management appointments    Valet parking services will be available as well.

## 2024-03-04 NOTE — Progress Notes (Signed)
 Sleep Medicine CONSULT Note    Date:  03/04/2024   ID:  [name], DOB January 22, 1961, MRN [id number]  PCP:  Ivonne Andrew, NP  Cardiologist: Marca Ancona, MD  Chief Complaint  Patient presents with   New Patient (Initial Visit)    OSA    History of Present Illness:  [name] is a 63 y.o. male who is being seen today for the evaluation of OSA at the request of Marca Ancona, MD.  This is a 63 y.o. 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   He is referred today for evaluation of possible Inspire device.  He says that he really struggles with the CPAP device.  He cannot keep the mask on during sleep.  He is constantly taking it off in his sleep.  He is very intolerant to the mask and does not like having anything on his face.  He would like to be evaluated for the [facility] device.  He has pretty much stopped using the device all together.   Past Medical History:  Diagnosis Date   AICD (automatic cardioverter/defibrillator) present    Anxiety    Atrial fibrillation (HCC)    CAD (coronary artery disease)    Cardiomyopathy (HCC)    CHF (congestive heart failure) (HCC) 09/2019   Depression    Diabetes mellitus    Erectile dysfunction 11/2019   GI bleeding    H/O right heart catheterization 09/2019   Hypertension    Presence of permanent cardiac pacemaker    Presence of Watchman left atrial appendage closure device 10/27/2021   27 mm Watchman Flex Device per Dr. Lalla Brothers   Schizophrenia [facility])    Sleep apnea    uses cpap    Past Surgical History:  Procedure Laterality Date   BIOPSY  04/19/2021    Procedure: BIOPSY;  Surgeon: Sherrilyn Rist, MD;  Location: [facility] ENDOSCOPY;  Service: Gastroenterology;;   COLONOSCOPY WITH PROPOFOL N/A 04/16/2021   Procedure: COLONOSCOPY WITH PROPOFOL;  Surgeon: Tressia Danas, MD;  Location: [facility] ENDOSCOPY;  Service: Gastroenterology;  Laterality: N/A;   CORONARY STENT PLACEMENT     ENTEROSCOPY N/A 04/08/2020   Procedure: ENTEROSCOPY;  Surgeon: Sherrilyn Rist, MD;  Location: [facility] ENDOSCOPY;  Service: Gastroenterology;  Laterality: N/A;   ENTEROSCOPY N/A 06/17/2020   Procedure: ENTEROSCOPY;  Surgeon: Hilarie Fredrickson, MD;  Location: [facility] ENDOSCOPY;  Service: Endoscopy;  Laterality: N/A;   ENTEROSCOPY N/A 04/15/2021   Procedure: ENTEROSCOPY;  Surgeon: Tressia Danas, MD;  Location: [facility] ENDOSCOPY;  Service: Gastroenterology;  Laterality: N/A;   ENTEROSCOPY N/A 04/19/2021   Procedure: ENTEROSCOPY;  Surgeon: Sherrilyn Rist, MD;  Location: [facility] ENDOSCOPY;  Service: Gastroenterology;  Laterality: N/A;   ENTEROSCOPY N/A 07/01/2021   Procedure: ENTEROSCOPY;  Surgeon: Beverley Fiedler, MD;  Location: [facility] ENDOSCOPY;  Service: Gastroenterology;  Laterality: N/A;   GIVENS CAPSULE STUDY  04/16/2021   Procedure: GIVENS CAPSULE STUDY;  Surgeon: Tressia Danas, MD;  Location: [facility] ENDOSCOPY;  Service: Gastroenterology;;   HEMOSTASIS CLIP PLACEMENT  04/08/2020   Procedure: HEMOSTASIS CLIP PLACEMENT;  Surgeon: Sherrilyn Rist, MD;  Location: [facility] ENDOSCOPY;  Service: Gastroenterology;;   HEMOSTASIS CLIP PLACEMENT  07/01/2021   Procedure: HEMOSTASIS CLIP PLACEMENT;  Surgeon: Beverley Fiedler, MD;  Location: [facility] ENDOSCOPY;  Service: Gastroenterology;;   HEMOSTASIS CONTROL  04/08/2020   Procedure: HEMOSTASIS CONTROL;  Surgeon: Sherrilyn Rist, MD;  Location: [facility] ENDOSCOPY;  Service: Gastroenterology;;   HEMOSTASIS CONTROL  06/17/2020   Procedure: HEMOSTASIS CONTROL;  Surgeon: Hilarie Fredrickson, MD;  Location: [facility] ENDOSCOPY;  Service: Endoscopy;;   HERNIA REPAIR     HOT HEMOSTASIS N/A 04/15/2021    Procedure: HOT HEMOSTASIS (ARGON PLASMA COAGULATION/BICAP);  Surgeon: Tressia Danas, MD;  Location: [facility] ENDOSCOPY;  Service: Gastroenterology;  Laterality: N/A;   HOT HEMOSTASIS N/A 07/01/2021   Procedure: HOT HEMOSTASIS (ARGON PLASMA COAGULATION/BICAP);  Surgeon: Beverley Fiedler, MD;  Location: [facility] ENDOSCOPY;  Service: Gastroenterology;  Laterality: N/A;   ICD IMPLANT N/A 09/03/2020   Procedure: ICD IMPLANT;  Surgeon: Lanier Prude, MD;  Location: [facility] INVASIVE CV LAB;  Service: Cardiovascular;  Laterality: N/A;   LEFT ATRIAL APPENDAGE OCCLUSION N/A 10/27/2021   Procedure: LEFT ATRIAL APPENDAGE OCCLUSION;  Surgeon: Lanier Prude, MD;  Location: [facility] INVASIVE CV LAB;  Service: Cardiovascular;  Laterality: N/A;   POLYPECTOMY  04/16/2021   Procedure: POLYPECTOMY;  Surgeon: Tressia Danas, MD;  Location: [facility] ENDOSCOPY;  Service: Gastroenterology;;   RIGHT/LEFT HEART CATH AND CORONARY ANGIOGRAPHY N/A 10/14/2019   Procedure: RIGHT/LEFT HEART CATH AND CORONARY ANGIOGRAPHY;  Surgeon: Lyn Records, MD;  Location: [facility] INVASIVE CV LAB;  Service: Cardiovascular;  Laterality: N/A;   SUBMUCOSAL TATTOO INJECTION  04/19/2021   Procedure: SUBMUCOSAL TATTOO INJECTION;  Surgeon: Sherrilyn Rist, MD;  Location: [facility] ENDOSCOPY;  Service: Gastroenterology;;   TEE WITHOUT CARDIOVERSION N/A 10/27/2021   Procedure: TRANSESOPHAGEAL ECHOCARDIOGRAM (TEE);  Surgeon: Lanier Prude, MD;  Location: [facility] INVASIVE CV LAB;  Service: Cardiovascular;  Laterality: N/A;   TEE WITHOUT CARDIOVERSION N/A 12/12/2021   Procedure: TRANSESOPHAGEAL ECHOCARDIOGRAM (TEE);  Surgeon: Sande Rives, MD;  Location: [facility] ENDOSCOPY;  Service: Cardiovascular;  Laterality: N/A;    Current Medications: Current Meds  Medication Sig   acetaminophen (TYLENOL) 500 MG tablet Take 2 tablets (1,000 mg total) by mouth 2 (two) times daily as needed. (Patient taking differently: Take 1,000 mg by mouth 2 (two) times daily as needed. Takes 1000mg   morning and night.)   albuterol (VENTOLIN HFA) 108 (90 Base) MCG/ACT inhaler Inhale 2 puffs into the lungs every 6 (six) hours as needed for wheezing or shortness of breath.   aspirin EC 81 MG tablet Take 81 mg by mouth daily. Swallow whole.   atorvastatin (LIPITOR) 40 MG tablet Take 1 tablet (40 mg total) by mouth daily.   blood glucose meter kit and supplies KIT Dispense based on patient and insurance preference. Use up to four times daily as directed. (FOR ICD-9 250.00, 250.01).   busPIRone (BUSPAR) 10 MG tablet Take 1 tablet (10 mg total) by mouth 2 (two) times daily.   colchicine 0.6 MG tablet Daily for 7 days then daily PRN for gout flare   Continuous Glucose Receiver (FREESTYLE LIBRE 3 READER) DEVI 1 each by Does not apply route daily. Use to check glucose continuously   Continuous Glucose Sensor (FREESTYLE LIBRE 3 SENSOR) MISC Place 1 sensor on the skin every 14 days. Use to check glucose continuously   diclofenac Sodium (VOLTAREN) 1 % GEL Apply 2 g topically daily as needed for pain.   DULoxetine (CYMBALTA) 30 MG capsule Take 1  capsule by mouth once daily   empagliflozin (JARDIANCE) 10 MG TABS tablet TAKE 1 TABLET BY MOUTH ONCE DAILY .   ENTRESTO 24-26 MG TAKE 1 TABLET BY MOUTH TWICE DAILY *FOLLOW UP APPOINTMENT FOR MORE REFILLS*   escitalopram (LEXAPRO) 10 MG tablet Take 10 mg by mouth daily. Prescribed by Dr. Elvera Lennox. Dimkpa   ferrous sulfate 325 (65 FE) MG tablet Take 1 tablet (325 mg total) by mouth every other day.   furosemide (LASIX) 40 MG tablet TAKE 1 & 1/2 (ONE & ONE-HALF) TABLETS BY MOUTH TWICE DAILY   gabapentin (NEURONTIN) 800 MG tablet TAKE 1 TABLET BY MOUTH THREE TIMES DAILY   HUMALOG MIX 75/25 KWIKPEN (75-25) 100 UNIT/ML KwikPen DIAL 30 UNITS AND INJECT UNDER THE SKIN TWICE DAILY. MAX DAILY DOSE: 60 UNITS.   metFORMIN (GLUCOPHAGE-XR) 500 MG 24 hr tablet TAKE 1 TABLET BY MOUTH TWICE DAILY WITH A MEAL   metoprolol succinate (TOPROL-XL) 50 MG 24 hr tablet Take 1.5 tablets (75 mg  total) by mouth daily.   Multiple Vitamins-Minerals (CENTRUM SILVER 50+MEN) TABS Take 1 tablet by mouth daily.   nicotine (NICODERM CQ - DOSED IN MG/24 HOURS) 14 mg/24hr patch Place 1 patch (14 mg total) onto the skin daily.   nicotine (NICODERM CQ - DOSED IN MG/24 HOURS) 21 mg/24hr patch Place 1 patch (21 mg total) onto the skin daily.   nicotine (NICODERM CQ - DOSED IN MG/24 HR) 7 mg/24hr patch Place 1 patch (7 mg total) onto the skin daily.   Omega-3 Fatty Acids (FISH OIL) 1000 MG CAPS Take 1,000 mg by mouth at bedtime.   pantoprazole (PROTONIX) 40 MG tablet Take 1 tablet by mouth once daily   potassium chloride SA (KLOR-CON M) 20 MEQ tablet Take 2 tablets (40 mEq total) by mouth daily.   QUEtiapine (SEROQUEL) 200 MG tablet Take 200 mg by mouth at bedtime.   RELION PEN NEEDLES 31G X 6 MM MISC INJECT 1 PEN INTO THE SKIN 2 TIMES DAILY   Semaglutide, 2 MG/DOSE, (OZEMPIC, 2 MG/DOSE,) 8 MG/3ML SOPN Inject 2 mg into the skin once a week. Monday   sildenafil (VIAGRA) 100 MG tablet TAKE 1/2 TO 1 (ONE-HALF TO ONE) TABLET BY MOUTH ONCE DAILY AS NEEDED FOR ERECTILE DYSFUNCTION   spironolactone (ALDACTONE) 25 MG tablet Take 1 tablet (25 mg total) by mouth daily.   tadalafil (CIALIS) 20 MG tablet Take 20 mg by mouth daily as needed.   Vitamin D, Ergocalciferol, (DRISDOL) 1.25 MG (50000 UNIT) CAPS capsule Take 1 capsule by mouth once a week   zinc gluconate 50 MG tablet Take 50 mg by mouth daily.   [DISCONTINUED] QUEtiapine (SEROQUEL) 300 MG tablet Take 1.5 tablets (450 mg total) by mouth at bedtime.    Allergies:   Patient has no known allergies.   Social History   Socioeconomic History   Marital status: Legally Separated    Spouse name: Not on file   Number of children: Not on file   Years of education: Not on file   Highest education level: Not on file  Occupational History   Not on file  Tobacco Use   Smoking status: Never    Passive exposure: Never   Smokeless tobacco: Never  Vaping Use    Vaping status: Never Used  Substance and Sexual Activity   Alcohol use: Not Currently    Alcohol/week: 2.0 standard drinks of alcohol    Types: 2 Cans of beer per week   Drug use: No   Sexual  activity: Yes    Birth control/protection: None  Other Topics Concern   Not on file  Social History Narrative   Not on file   Social Drivers of Health   Financial Resource Strain: Medium Risk (05/24/2023)   Overall Financial Resource Strain (CARDIA)    Difficulty of Paying Living Expenses: Somewhat hard  Food Insecurity: No Food Insecurity (02/04/2024)   Hunger Vital Sign    Worried About Running Out of Food in the Last Year: Never true    Ran Out of Food in the Last Year: Never true  Transportation Needs: No Transportation Needs (05/24/2023)   PRAPARE - Administrator, Civil Service (Medical): No    Lack of Transportation (Non-Medical): No  Physical Activity: Sufficiently Active (05/24/2023)   Exercise Vital Sign    Days of Exercise per Week: 7 days    Minutes of Exercise per Session: 30 min  Stress: No Stress Concern Present (05/24/2023)   [employer] of Occupational Health - Occupational Stress Questionnaire    Feeling of Stress : Not at all  Social Connections: Moderately Isolated (05/24/2023)   Social Connection and Isolation Panel [NHANES]    Frequency of Communication with Friends and Family: More than three times a week    Frequency of Social Gatherings with Friends and Family: More than three times a week    Attends Religious Services: Never    Database administrator or Organizations: No    Attends Engineer, structural: Never    Marital Status: Living with partner     Family History:  The patient's family history includes Heart failure in his mother; Mental illness in his sister and sister.   ROS:   Please see the history of present illness.    ROS All other systems reviewed and are negative.      No data to display             PHYSICAL  EXAM:   VS:  BP 126/76   Pulse 76   Ht 6\' 2"  (1.88 m)   Wt 279 lb (126.6 kg)   SpO2 96%   BMI 35.82 kg/m    GEN: Well nourished, well developed, in no acute distress  HEENT: normal  Neck: no JVD, carotid bruits, or masses Cardiac: RRR; no murmurs, rubs, or gallops,no edema.  Intact distal pulses bilaterally.  Respiratory:  clear to auscultation bilaterally, normal work of breathing GI: soft, nontender, nondistended, + BS MS: no deformity or atrophy  Skin: warm and dry, no rash Neuro:  Alert and Oriented x 3, Strength and sensation are intact Psych: euthymic mood, full affect  Wt Readings from Last 3 Encounters:  03/04/24 279 lb (126.6 kg)  02/25/24 281 lb (127.5 kg)  02/18/24 280 lb (127 kg)      Studies/Labs Reviewed:   PAP compliance download  Recent Labs: 09/28/2023: B Natriuretic Peptide 58.8; TSH 2.201 02/04/2024: ALT 21; BUN 10; Creatinine, Ser 0.92; Hemoglobin 14.1; Platelets 255; Potassium 3.6; Sodium 143    ASSESSMENT:    1. OSA (obstructive sleep apnea)   2. Essential hypertension      PLAN:  In order of problems listed above:  OSA -  -the patient is intolerant to CPAP due to inability to keep his mask on in his sleep and intolerant to the mask on his face -he is interested in the [place] device which I explained to him in detail -I think he would be a good candidate but is borderline on the  weight requirement -I am going to start by getting an [place] HST to assess degree of OSA and if AHI>15/hr then refer to Dr. Jenne Pane with ENT for further evaluation  HTN -BP controlled on exam today -continue prescription drug management with Entrestor 24-26mg  BID, Toprol XL 75mg  daily, spironolactone 25mg  daily with PRN refills   Time Spent: 20 minutes total time of encounter, including 15 minutes spent in face-to-face patient care on the date of this encounter. This time includes coordination of care and counseling regarding above mentioned problem list. Remainder  of non-face-to-face time involved reviewing chart documents/testing relevant to the patient encounter and documentation in the medical record. I have independently reviewed documentation from referring provider  Medication Adjustments/Labs and Tests Ordered: Current medicines are reviewed at length with the patient today.  Concerns regarding medicines are outlined above.  Medication changes, Labs and Tests ordered today are listed in the Patient Instructions below.  There are no Patient Instructions on file for this visit.   Signed, Armanda Magic, MD  03/04/2024 8:25 AM    [facility] [address] [place], [place], [place]  [address] Phone: [phone]; Fax: [phone]

## 2024-03-05 ENCOUNTER — Telehealth (HOSPITAL_COMMUNITY): Payer: Self-pay

## 2024-03-05 ENCOUNTER — Other Ambulatory Visit: Payer: Self-pay | Admitting: Nurse Practitioner

## 2024-03-05 ENCOUNTER — Other Ambulatory Visit: Payer: Self-pay | Admitting: Physical Medicine & Rehabilitation

## 2024-03-05 ENCOUNTER — Other Ambulatory Visit (HOSPITAL_COMMUNITY): Payer: Self-pay | Admitting: Family Medicine

## 2024-03-05 ENCOUNTER — Other Ambulatory Visit (HOSPITAL_COMMUNITY): Payer: Self-pay | Admitting: Cardiology

## 2024-03-05 ENCOUNTER — Ambulatory Visit (INDEPENDENT_AMBULATORY_CARE_PROVIDER_SITE_OTHER): Payer: Medicare Other

## 2024-03-05 DIAGNOSIS — E1165 Type 2 diabetes mellitus with hyperglycemia: Secondary | ICD-10-CM

## 2024-03-05 DIAGNOSIS — I5022 Chronic systolic (congestive) heart failure: Secondary | ICD-10-CM

## 2024-03-05 DIAGNOSIS — G6289 Other specified polyneuropathies: Secondary | ICD-10-CM

## 2024-03-05 NOTE — Telephone Encounter (Signed)
 HF Paramedicine Message to [facility]  Pharmacy (if applicable): [facility] on [address]    Issue/reason for call: Refill   Medication refill?  -Lasix Bennie Dallas, EMT-Paramedic [phone] 03/05/2024

## 2024-03-06 LAB — CUP PACEART REMOTE DEVICE CHECK
Date Time Interrogation Session: 20250410081956
Implantable Lead Connection Status: 753985
Implantable Lead Implant Date: 20211008
Implantable Lead Location: 753860
Implantable Lead Model: 436910
Implantable Lead Serial Number: 81404997
Implantable Pulse Generator Implant Date: 20211008
Pulse Gen Model: 429525
Pulse Gen Serial Number: 84810752

## 2024-03-08 ENCOUNTER — Encounter: Payer: Self-pay | Admitting: Cardiology

## 2024-03-11 ENCOUNTER — Other Ambulatory Visit (HOSPITAL_COMMUNITY): Payer: Self-pay | Admitting: Cardiology

## 2024-03-11 ENCOUNTER — Other Ambulatory Visit (HOSPITAL_COMMUNITY): Payer: Self-pay

## 2024-03-11 ENCOUNTER — Other Ambulatory Visit: Payer: Self-pay | Admitting: Physical Medicine & Rehabilitation

## 2024-03-11 ENCOUNTER — Telehealth (HOSPITAL_COMMUNITY): Payer: Self-pay

## 2024-03-11 ENCOUNTER — Other Ambulatory Visit (HOSPITAL_COMMUNITY): Payer: Self-pay | Admitting: Family Medicine

## 2024-03-11 DIAGNOSIS — Z794 Long term (current) use of insulin: Secondary | ICD-10-CM

## 2024-03-11 DIAGNOSIS — G6289 Other specified polyneuropathies: Secondary | ICD-10-CM

## 2024-03-11 NOTE — Telephone Encounter (Signed)
 I saw [name] in the home this morning and he reports he has not gotten a call about the PIN for him to begin his at home sleep study using the [facility] PAT. Do you have any insight on this for him? I am happy to assist in communicating this with him once you give me that information. Thanks!   Roberts Ching, EMT-Paramedic [phone] 03/11/2024

## 2024-03-11 NOTE — Progress Notes (Signed)
 Paramedicine Encounter    Patient ID: [name], male    DOB: 01/09/1961, 63 y.o.   MRN: [id number]   Complaints- "mentally stressed" feeling good otherwise   Assessment- CAOx4, warm and dry ambulating around his home with no shortness of breath, lungs clear, no lower leg edema, vitals as noted HR slightly elevated today had not taken Sundays, yesterdays or todays meds   Compliance with meds- missed Sunday and yesterday morning, noon and evening.   Pill box filled- for one week missing several meds needing refills he did not pick up   Refills needed- -Potassium -Metformin -Gabapentin -Spironolactone -Seroquel  -Lasix -Buspar -Entresto -Atorvastatin   Meds changes since last visit- none     Social changes- him and his wife are no longer seeing each other he continues to lives at the [address] address.  - waiting for pin number for WATCHPAT at home sleep study  -several boxes of humalog in his fridge- expressed need for compliance of same.  -has one pen of ozempic left- was approved waiting shipment.    VISIT SUMMARY-  Arrived for home visit for [name] who reports to be feeling well just mentally stressed. I obtained vitals and assessment as noted. He denied any recent chest pain, dizziness or swelling, shortness of breath. Lungs clear. No edema. I reviewed meds and confirmed same and filled pill box for one week but its missing several meds due to him not picking up refills. He missed Sunday and Monday meds. I also reviewed his insulin- he has several boxes of humalog. I advised him to be more compliant with this also. He verbalized understanding. He has his freestyle on and CBG was 128 this morning. Home visit complete. I will see [name] in one week. (Med rec later this week)  BP 110/62   Pulse (!) 110   Resp 16   Wt 273 lb (123.8 kg)   SpO2 98%   BMI 35.05 kg/m  Weight yesterday-- didn't weigh  Last visit weight-- 279lbs      ACTION: Home visit  completed     Patient Care Team: Ivonne Andrew, NP as PCP - General (Pulmonary Disease) Quintella Reichert, MD as PCP - Cardiology (Cardiology) Laurey Morale, MD as PCP - Advanced Heart Failure (Cardiology) Lanier Prude, MD as PCP - Electrophysiology (Cardiology) Burna Sis, LCSW as Social Worker (Licensed Clinical Social Worker) Tressia Danas, MD (Inactive) as Consulting Physician (Gastroenterology)  Patient Active Problem List   Diagnosis Date Noted   PVC's (premature ventricular contractions) 09/28/2023   Acute right-sided low back pain with right-sided sciatica 05/14/2023   Multiple joint pain 12/22/2022   Obesity (BMI 35.0-39.9 without comorbidity) 07/18/2022   Cellulitis 06/22/2022   Cellulitis, leg 06/20/2022   Presence of Watchman left atrial appendage closure device 10/28/2021   Atrial fibrillation (HCC) 10/27/2021   Melena    Chronic diastolic CHF (congestive heart failure) (HCC)    GI bleed 06/29/2021   Severe anemia 06/29/2021   Adenomatous polyp of ascending colon    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   Acute blood loss anemia    Angiodysplasia of stomach    Chronic anticoagulation    CHF exacerbation (HCC) 06/15/2020   AKI (acute kidney injury) (HCC) 06/15/2020   CKD (chronic kidney disease), stage III (HCC) 06/15/2020   Anemia due to chronic blood loss    Gastric AVM    Angiodysplasia of duodenum with hemorrhage    Chronic systolic CHF (congestive heart  failure) (HCC) 04/05/2020   Anemia 04/05/2020   PAF (paroxysmal atrial fibrillation) (HCC) 04/05/2020   OSA (obstructive sleep apnea) 04/05/2020   Syncope and collapse 04/05/2020   Type 2 diabetes mellitus with hyperglycemia, with long-term current use of insulin (HCC) 12/03/2019   Diabetic polyneuropathy associated with type 2 diabetes mellitus (HCC) 12/03/2019   Erectile dysfunction 12/03/2019   Paranoid schizophrenia, chronic condition (HCC) 01/26/2015    Severe recurrent major depressive disorder with psychotic features (HCC) 01/26/2015   GAD (generalized anxiety disorder) 01/26/2015   OCD (obsessive compulsive disorder) 01/26/2015   Panic disorder without agoraphobia 01/26/2015   Insomnia 01/26/2015    Current Outpatient Medications:    acetaminophen (TYLENOL) 500 MG tablet, Take 2 tablets (1,000 mg total) by mouth 2 (two) times daily as needed. (Patient taking differently: Take 1,000 mg by mouth 2 (two) times daily as needed. Takes 1000mg  morning and night.), Disp: 180 tablet, Rfl: 3   albuterol (VENTOLIN HFA) 108 (90 Base) MCG/ACT inhaler, Inhale 2 puffs into the lungs every 6 (six) hours as needed for wheezing or shortness of breath., Disp: 18 g, Rfl: 1   aspirin EC 81 MG tablet, Take 81 mg by mouth daily. Swallow whole., Disp: , Rfl:    atorvastatin (LIPITOR) 40 MG tablet, Take 1 tablet (40 mg total) by mouth daily., Disp: 90 tablet, Rfl: 3   blood glucose meter kit and supplies KIT, Dispense based on patient and insurance preference. Use up to four times daily as directed. (FOR ICD-9 250.00, 250.01)., Disp: 1 each, Rfl: 0   busPIRone (BUSPAR) 10 MG tablet, Take 1 tablet (10 mg total) by mouth 2 (two) times daily., Disp: 60 tablet, Rfl: 2   colchicine 0.6 MG tablet, Daily for 7 days then daily PRN for gout flare, Disp: 30 tablet, Rfl: 0   Continuous Glucose Receiver (FREESTYLE LIBRE 3 READER) DEVI, 1 each by Does not apply route daily. Use to check glucose continuously, Disp: 1 each, Rfl: 1   Continuous Glucose Sensor (FREESTYLE LIBRE 3 SENSOR) MISC, Place 1 sensor on the skin every 14 days. Use to check glucose continuously, Disp: 6 each, Rfl: 2   diclofenac Sodium (VOLTAREN) 1 % GEL, Apply 2 g topically daily as needed for pain., Disp: , Rfl:    empagliflozin (JARDIANCE) 10 MG TABS tablet, TAKE 1 TABLET BY MOUTH ONCE DAILY ., Disp: 90 tablet, Rfl: 3   ENTRESTO 24-26 MG, TAKE 1 TABLET BY MOUTH TWICE DAILY . APPOINTMENT REQUIRED FOR FUTURE  REFILLS, Disp: 180 tablet, Rfl: 0   ferrous sulfate 325 (65 FE) MG tablet, Take 1 tablet (325 mg total) by mouth every other day., Disp: 30 tablet, Rfl: 1   furosemide (LASIX) 40 MG tablet, TAKE 1 & 1/2 (ONE & ONE-HALF) TABLETS BY MOUTH TWICE DAILY, Disp: 90 tablet, Rfl: 0   gabapentin (NEURONTIN) 800 MG tablet, TAKE 1 TABLET BY MOUTH THREE TIMES DAILY, Disp: 90 tablet, Rfl: 0   HUMALOG MIX 75/25 KWIKPEN (75-25) 100 UNIT/ML KwikPen, DIAL 30 UNITS AND INJECT UNDER THE SKIN TWICE DAILY. MAX DAILY DOSE: 60 UNITS., Disp: 60 mL, Rfl: 0   metFORMIN (GLUCOPHAGE-XR) 500 MG 24 hr tablet, TAKE 1 TABLET BY MOUTH TWICE DAILY WITH A MEAL, Disp: 60 tablet, Rfl: 0   metoprolol succinate (TOPROL-XL) 50 MG 24 hr tablet, Take 1.5 tablets (75 mg total) by mouth daily., Disp: 45 tablet, Rfl: 11   Multiple Vitamins-Minerals (CENTRUM SILVER 50+MEN) TABS, Take 1 tablet by mouth daily., Disp: , Rfl:  Omega-3 Fatty Acids (FISH OIL) 1000 MG CAPS, Take 1,000 mg by mouth at bedtime., Disp: , Rfl:    pantoprazole (PROTONIX) 40 MG tablet, Take 1 tablet by mouth once daily, Disp: 90 tablet, Rfl: 0   potassium chloride SA (KLOR-CON M) 20 MEQ tablet, Take 2 tablets (40 mEq total) by mouth daily., Disp: 60 tablet, Rfl: 6   QUEtiapine (SEROQUEL) 200 MG tablet, Take 200 mg by mouth at bedtime., Disp: , Rfl:    RELION PEN NEEDLES 31G X 6 MM MISC, INJECT 1 PEN INTO THE SKIN 2 TIMES DAILY, Disp: 100 each, Rfl: 0   Semaglutide, 2 MG/DOSE, (OZEMPIC, 2 MG/DOSE,) 8 MG/3ML SOPN, Inject 2 mg into the skin once a week. Monday, Disp: , Rfl:    sildenafil (VIAGRA) 100 MG tablet, TAKE 1/2 TO 1 (ONE-HALF TO ONE) TABLET BY MOUTH ONCE DAILY AS NEEDED FOR ERECTILE DYSFUNCTION, Disp: 5 tablet, Rfl: 0   spironolactone (ALDACTONE) 25 MG tablet, Take 1 tablet (25 mg total) by mouth daily., Disp: 90 tablet, Rfl: 3   DULoxetine (CYMBALTA) 30 MG capsule, Take 1 capsule by mouth once daily (Patient not taking: Reported on 03/11/2024), Disp: 30 capsule, Rfl:  2   escitalopram (LEXAPRO) 10 MG tablet, Take 10 mg by mouth daily. Prescribed by Dr. Ardeth Beckers. Dimkpa (Patient not taking: Reported on 03/04/2024), Disp: , Rfl:    nicotine (NICODERM CQ - DOSED IN MG/24 HOURS) 14 mg/24hr patch, Place 1 patch (14 mg total) onto the skin daily., Disp: 28 patch, Rfl: 0   nicotine (NICODERM CQ - DOSED IN MG/24 HOURS) 21 mg/24hr patch, Place 1 patch (21 mg total) onto the skin daily. (Patient not taking: Reported on 03/11/2024), Disp: 28 patch, Rfl: 0   nicotine (NICODERM CQ - DOSED IN MG/24 HR) 7 mg/24hr patch, Place 1 patch (7 mg total) onto the skin daily. (Patient not taking: Reported on 03/11/2024), Disp: 28 patch, Rfl: 0   tadalafil (CIALIS) 20 MG tablet, Take 20 mg by mouth daily as needed. (Patient not taking: Reported on 03/11/2024), Disp: , Rfl:    Vitamin D, Ergocalciferol, (DRISDOL) 1.25 MG (50000 UNIT) CAPS capsule, Take 1 capsule by mouth once a week (Patient not taking: Reported on 03/11/2024), Disp: 5 capsule, Rfl: 0   zinc gluconate 50 MG tablet, Take 50 mg by mouth daily. (Patient not taking: Reported on 03/04/2024), Disp: , Rfl:  No Known Allergies   Social History   Socioeconomic History   Marital status: Legally Separated    Spouse name: Not on file   Number of children: Not on file   Years of education: Not on file   Highest education level: Not on file  Occupational History   Not on file  Tobacco Use   Smoking status: Never    Passive exposure: Never   Smokeless tobacco: Never  Vaping Use   Vaping status: Never Used  Substance and Sexual Activity   Alcohol use: Not Currently    Alcohol/week: 2.0 standard drinks of alcohol    Types: 2 Cans of beer per week   Drug use: No   Sexual activity: Yes    Birth control/protection: None  Other Topics Concern   Not on file  Social History Narrative   Not on file   Social Drivers of Health   Financial Resource Strain: Medium Risk (05/24/2023)   Overall Financial Resource Strain (CARDIA)    Difficulty  of Paying Living Expenses: Somewhat hard  Food Insecurity: No Food Insecurity (02/04/2024)   Hunger Vital  Sign    Worried About Programme researcher, broadcasting/film/video in the Last Year: Never true    Ran Out of Food in the Last Year: Never true  Transportation Needs: No Transportation Needs (05/24/2023)   PRAPARE - Administrator, Civil Service (Medical): No    Lack of Transportation (Non-Medical): No  Physical Activity: Sufficiently Active (05/24/2023)   Exercise Vital Sign    Days of Exercise per Week: 7 days    Minutes of Exercise per Session: 30 min  Stress: No Stress Concern Present (05/24/2023)   [employer] of Occupational Health - Occupational Stress Questionnaire    Feeling of Stress : Not at all  Social Connections: Moderately Isolated (05/24/2023)   Social Connection and Isolation Panel [NHANES]    Frequency of Communication with Friends and Family: More than three times a week    Frequency of Social Gatherings with Friends and Family: More than three times a week    Attends Religious Services: Never    Database administrator or Organizations: No    Attends Banker Meetings: Never    Marital Status: Living with partner  Intimate Partner Violence: Not At Risk (05/24/2023)   Humiliation, Afraid, Rape, and Kick questionnaire    Fear of Current or Ex-Partner: No    Emotionally Abused: No    Physically Abused: No    Sexually Abused: No    Physical Exam      Future Appointments  Date Time Provider Department Center  05/12/2024  9:40 AM Jerrlyn Morel, NP [facility] None  05/29/2024 12:30 PM [facility]-ANNUAL WELLNESS VISIT [facility] None  06/05/2024  7:40 AM [facility] DEVICE REMOTES [facility]  09/05/2024  7:40 AM [facility] DEVICE REMOTES [facility]

## 2024-03-11 NOTE — Telephone Encounter (Signed)
 HF Paramedicine Message to [facility]  Pharmacy (if applicable):[facility] [address]    Issue/reason for call: Refills   Medication refill?  -Mr. Ferber needs the following refills sent to [facility] on [address]   -atorvastatin -spironolactone -lasix  -entresto   Thanks.   Roberts Ching, EMT-Paramedic [phone] 03/11/2024

## 2024-03-11 NOTE — Telephone Encounter (Signed)
**Note De-Identified [name] Obfuscation** Ordering provider: Dr Micael Adas Associated diagnoses: OSA-G47.33 and HTN-I10  WatchPAT PA obtained on 03/11/2024 by [name], Joel Mao, LPN. Authorization: Per the [facility] Provider Portal: The following solutions for the service date entered do not require Pre-Authorization by [employer]: CPT Code: 95800-[id number]. I did call [name], EMT-Paramedic back but got no answer. The pt stated that he will let her know what his Pin # is as she is going to assist him with the [facility] One-HST Device and he is aware to call us  if they have any questions or concerns.  Patient notified of PIN ([phone]) on 03/11/2024 [name] Notification Method: phone.  Phone note routed to covering staff for follow-up.

## 2024-03-12 MED ORDER — ENTRESTO 24-26 MG PO TABS: 1.0000 | ORAL_TABLET | Freq: Two times a day (BID) | ORAL | 1 refills | Status: DC

## 2024-03-12 MED ORDER — SPIRONOLACTONE 25 MG PO TABS: 25.0000 mg | ORAL_TABLET | Freq: Every day | ORAL | 3 refills | Status: DC

## 2024-03-12 MED ORDER — FUROSEMIDE 40 MG PO TABS: 60.0000 mg | ORAL_TABLET | Freq: Two times a day (BID) | ORAL | 3 refills | Status: DC

## 2024-03-12 NOTE — Telephone Encounter (Signed)
 Atorvastatin refilled by other means -spiro,lasix,entresto refilled to pharmacy as requested

## 2024-03-12 NOTE — Addendum Note (Signed)
 Addended by: Edris Gowers on: 03/12/2024 03:51 PM   Modules accepted: Orders

## 2024-03-14 ENCOUNTER — Encounter (INDEPENDENT_AMBULATORY_CARE_PROVIDER_SITE_OTHER): Payer: Self-pay | Admitting: Cardiology

## 2024-03-14 DIAGNOSIS — G4733 Obstructive sleep apnea (adult) (pediatric): Secondary | ICD-10-CM

## 2024-03-17 ENCOUNTER — Other Ambulatory Visit (HOSPITAL_COMMUNITY): Payer: Self-pay

## 2024-03-17 ENCOUNTER — Ambulatory Visit: Attending: Cardiology

## 2024-03-17 DIAGNOSIS — G4733 Obstructive sleep apnea (adult) (pediatric): Secondary | ICD-10-CM

## 2024-03-17 DIAGNOSIS — I10 Essential (primary) hypertension: Secondary | ICD-10-CM

## 2024-03-17 NOTE — Procedures (Signed)
 SLEEP STUDY REPORT Patient Information Study Date: 03/14/2024 Patient Name: [name] Patient ID: [id number] Birth Date: 1961/04/29 Age: 63 Gender: Male BMI: 35.9 (W=280 lb, H=6' 2'') Stopbang: 7 Referring Physician: Gaylyn Keas, MD  TEST DESCRIPTION: Home sleep apnea testing was completed using the WatchPat, a Type 1 device, utilizing  peripheral arterial tonometry (PAT), chest movement, actigraphy, pulse oximetry, pulse rate, body position and snore.  AHI was calculated with apnea and hypopnea using valid sleep time as the denominator. RDI includes apneas,  hypopneas, and RERAs. The data acquired and the scoring of sleep and all associated events were performed in  accordance with the recommended standards and specifications as outlined in the AASM Manual for the Scoring of  Sleep and Associated Events 2.2.0 (2015).  FINDINGS:  1. Severe Obstructive Sleep Apnea with AHI 39/hr.   2. No Central Sleep Apnea with pAHIc 7.4/hr.  3. Oxygen desaturations as low as 61%.  4. Severe snoring was present. O2 sats were < 88% for 52.6 min.  5. Total sleep time was 4 hrs and 24 min.  6. 22.2% of total sleep time was spent in REM sleep.   7. sleep onset latency at 9 min.   8. REM sleep onset latency at 74 min.   9. Total awakenings were 16 .  10. Arrhythmia detection: Suggestive of possible brief atrial fibrillation lasting 2 minutes and 10seconds. This is not  diagnostic and further testing with outpatient telemetry monitoring is recommended.  DIAGNOSIS:  Severe Obstructive Sleep Apnea (G47.33) Nocturnal Hypoxemia Possible Atrial Fibrillation  RECOMMENDATIONS: 1. Clinical correlation of these findings is necessary. The decision to treat obstructive sleep apnea (OSA) is usually  based on the presence of apnea symptoms or the presence of associated medical conditions such as Hypertension,  Congestive Heart Failure, Atrial Fibrillation or Obesity. The most common symptoms of OSA  are snoring, gasping for  breath while sleeping, daytime sleepiness and fatigue.  2. Initiating apnea therapy is recommended given the presence of symptoms and/or associated conditions.  Recommend proceeding with one of the following:  a. Auto-CPAP therapy with a pressure range of 5-20cm H2O.  b. An oral appliance (OA) that can be obtained from certain dentists with expertise in sleep medicine. These are  primarily of use in non-obese patients with mild and moderate disease.  c. An ENT consultation which may be useful to look for specific causes of obstruction and possible treatment  options.  d. If patient is intolerant to PAP therapy, consider referral to ENT for evaluation for hypoglossal nerve stimulator.  3. Close follow-up is necessary to ensure success with CPAP or oral appliance therapy for maximum benefit . 4. A follow-up oximetry study on CPAP is recommended to assess the adequacy of therapy and determine the need  for supplemental oxygen or the potential need for Bi-level therapy. An arterial blood gas to determine the adequacy of  baseline ventilation and oxygenation should also be considered. 5. Healthy sleep recommendations include: adequate nightly sleep (normal 7-9 hrs/night), avoidance of caffeine after  noon and alcohol near bedtime, and maintaining a sleep environment that is cool, dark and quiet. 6. Weight loss for overweight patients is recommended. Even modest amounts of weight loss can significantly  improve the severity of sleep apnea. 7. Snoring recommendations include: weight loss where appropriate, side sleeping, and avoidance of alcohol before  bed. 8. Operation of motor vehicle should be avoided when sleepy.  Signature: Gaylyn Keas, MD; [facility]; Diplomat, American Board of Sleep  Medicine Electronically  Signed: 03/17/2024 12:51:46 PM

## 2024-03-17 NOTE — Progress Notes (Signed)
 Paramedicine Encounter    Patient ID: [name], male    DOB: 1961/06/03, 63 y.o.   MRN: [id number]   Complaints- stress, dizziness    Assessment- CAOX4, warm and dry, ambulatory without shortness of breath, no lower leg edema, lungs clear, vitals as noted. HR noted to be irregular- he has not picked up his potassium and metoprolol  yet I urged him to do so asap. He's been without same for over a week.   Compliance with meds- missed potassium and metoprolol  for over a week due to failure to pick up at pharmacy   Pill box filled- for one week   Refills needed-  Metoprolol  Potassium Seroquel  Aspirin    Meds changes since last visit- none     Social changes- Reports him and his wife [name] are no longer together- he is living alone now at 50 [name].    [name] SUMMARY- Arrived for home visit for [name] who reports to be feeling well other than just mental stress. He denied any chest pain, shortness of breath, lower leg swelling. He reports some dizziness but says that it's not effecting his balance. He has been without his metoprolol  and potassium for over a week now due to failure to pick it up. I advised him to be sure to pick it up as soon as possible. He verbalized understanding. I reviewed HF education on diet and sodium intake. He confirmed same and said he understood. I reviewed meds and filled pill box for one week (minus his seroquel , metoprolol , potassium) I plan to see [name] in one week. Visit complete.   There were no vitals taken for this visit. Weight yesterday-- didn't weigh  Last visit weight-- 273lbs      ACTION: Home visit completed     Patient Care Team: Joel Morel, NP as PCP - General (Pulmonary Disease) Joel Matsu, MD as PCP - Cardiology (Cardiology) Joel Eisenmenger, MD as PCP - Advanced Heart Failure (Cardiology) Joel Byes, MD as PCP - Electrophysiology (Cardiology) Joel Flakes, LCSW as Social Worker (Licensed Clinical  Social Worker) Joel Rhea, MD (Inactive) as Consulting Physician (Gastroenterology)  Patient Active Problem List   Diagnosis Date Noted   PVC's (premature ventricular contractions) 09/28/2023   Acute right-sided low back pain with right-sided sciatica 05/14/2023   Multiple joint pain 12/22/2022   Obesity (BMI 35.0-39.9 without comorbidity) 07/18/2022   Cellulitis 06/22/2022   Cellulitis, leg 06/20/2022   Presence of Watchman left atrial appendage closure device 10/28/2021   Atrial fibrillation (HCC) 10/27/2021   Melena    Chronic diastolic CHF (congestive heart failure) (HCC)    GI bleed 06/29/2021   Severe anemia 06/29/2021   Adenomatous polyp of ascending colon    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   Acute blood loss anemia    Angiodysplasia of stomach    Chronic anticoagulation    CHF exacerbation (HCC) 06/15/2020   AKI (acute kidney injury) (HCC) 06/15/2020   CKD (chronic kidney disease), stage III (HCC) 06/15/2020   Anemia due to chronic blood loss    Gastric AVM    Angiodysplasia of duodenum with hemorrhage    Chronic systolic CHF (congestive heart failure) (HCC) 04/05/2020   Anemia 04/05/2020   PAF (paroxysmal atrial fibrillation) (HCC) 04/05/2020   OSA (obstructive sleep apnea) 04/05/2020   Syncope and collapse 04/05/2020   Type 2 diabetes mellitus with hyperglycemia, with Long-term current use of insulin  (HCC) 12/03/2019   Diabetic polyneuropathy associated with type 2 diabetes mellitus (  HCC) 12/03/2019   Erectile dysfunction 12/03/2019   Paranoid schizophrenia, chronic condition (HCC) 01/26/2015   Severe recurrent major depressive disorder with psychotic features (HCC) 01/26/2015   GAD (generalized anxiety disorder) 01/26/2015   OCD (obsessive compulsive disorder) 01/26/2015   Panic disorder without agoraphobia 01/26/2015   Insomnia 01/26/2015    Current Outpatient Medications:    acetaminophen  (TYLENOL ) 500 MG tablet, Take 2  tablets (1,000 mg total) by mouth 2 (two) times daily as needed. (Patient taking differently: Take 1,000 mg by mouth 2 (two) times daily as needed. Takes 1000mg  morning and night.), Disp: 180 tablet, Rfl: 3   albuterol  (VENTOLIN  HFA) 108 (90 Base) MCG/ACT inhaler, Inhale 2 puffs into the lungs every 6 (six) hours as needed for wheezing or shortness of breath., Disp: 18 g, Rfl: 1   aspirin  EC 81 MG tablet, Take 81 mg by mouth daily. Swallow whole., Disp: , Rfl:    atorvastatin  (LIPITOR) 40 MG tablet, Take 1 tablet by mouth once daily, Disp: 90 tablet, Rfl: 1   blood glucose meter kit and supplies KIT, Dispense based on patient and insurance preference. Use up to four times daily as directed. (FOR ICD-9 250.00, 250.01)., Disp: 1 each, Rfl: 0   busPIRone  (BUSPAR ) 10 MG tablet, Take 1 tablet (10 mg total) by mouth 2 (two) times daily., Disp: 60 tablet, Rfl: 2   colchicine  0.6 MG tablet, Daily for 7 days then daily PRN for gout flare, Disp: 30 tablet, Rfl: 0   Continuous Glucose Receiver (FREESTYLE LIBRE 3 READER) DEVI, 1 each by Does not apply route daily. Use to check glucose continuously, Disp: 1 each, Rfl: 1   Continuous Glucose Sensor (FREESTYLE LIBRE 3 SENSOR) MISC, Place 1 sensor on the skin every 14 days. Use to check glucose continuously, Disp: 6 each, Rfl: 2   diclofenac  Sodium (VOLTAREN) 1 % GEL, Apply 2 g topically daily as needed for pain., Disp: , Rfl:    DULoxetine  (CYMBALTA ) 30 MG capsule, Take 1 capsule by mouth once daily (Patient not taking: Reported on 03/11/2024), Disp: 30 capsule, Rfl: 2   empagliflozin  (JARDIANCE ) 10 MG TABS tablet, TAKE 1 TABLET BY MOUTH ONCE DAILY ., Disp: 90 tablet, Rfl: 3   escitalopram  (LEXAPRO ) 10 MG tablet, Take 10 mg by mouth daily. Prescribed by Long. Ardeth Beckers. Dimkpa (Patient not taking: Reported on 03/04/2024), Disp: , Rfl:    ferrous sulfate  325 (65 FE) MG tablet, Take 1 tablet (325 mg total) by mouth every other day., Disp: 30 tablet, Rfl: 1   furosemide  (LASIX ) 40  MG tablet, Take 1.5 tablets (60 mg total) by mouth 2 (two) times daily., Disp: 90 tablet, Rfl: 3   gabapentin  (NEURONTIN ) 800 MG tablet, TAKE 1 TABLET BY MOUTH THREE TIMES DAILY, Disp: 90 tablet, Rfl: 0   HUMALOG  MIX 75/25 KWIKPEN (75-25) 100 UNIT/ML KwikPen, DIAL 30 UNITS AND INJECT UNDER THE SKIN TWICE DAILY. MAX DAILY DOSE: 60 UNITS., Disp: 60 mL, Rfl: 0   metFORMIN  (GLUCOPHAGE -XR) 500 MG 24 hr tablet, TAKE 1 TABLET BY MOUTH TWICE DAILY WITH A MEAL, Disp: 60 tablet, Rfl: 0   metoprolol  succinate (TOPROL -XL) 50 MG 24 hr tablet, Take 1.5 tablets (75 mg total) by mouth daily., Disp: 45 tablet, Rfl: 11   Multiple Vitamins-Minerals (CENTRUM SILVER 50+MEN) TABS, Take 1 tablet by mouth daily., Disp: , Rfl:    nicotine  (NICODERM CQ  - DOSED IN MG/24 HOURS) 14 mg/24hr patch, Place 1 patch (14 mg total) onto the skin daily., Disp: 28 patch, Rfl: 0  nicotine  (NICODERM CQ  - DOSED IN MG/24 HOURS) 21 mg/24hr patch, Place 1 patch (21 mg total) onto the skin daily. (Patient not taking: Reported on 03/11/2024), Disp: 28 patch, Rfl: 0   nicotine  (NICODERM CQ  - DOSED IN MG/24 HR) 7 mg/24hr patch, Place 1 patch (7 mg total) onto the skin daily. (Patient not taking: Reported on 03/11/2024), Disp: 28 patch, Rfl: 0   Omega-3 Fatty Acids (FISH OIL) 1000 MG CAPS, Take 1,000 mg by mouth at bedtime., Disp: , Rfl:    pantoprazole  (PROTONIX ) 40 MG tablet, Take 1 tablet by mouth once daily, Disp: 90 tablet, Rfl: 0   potassium chloride  SA (KLOR-CON  M) 20 MEQ tablet, Take 2 tablets (40 mEq total) by mouth daily., Disp: 60 tablet, Rfl: 6   QUEtiapine  (SEROQUEL ) 200 MG tablet, Take 200 mg by mouth at bedtime., Disp: , Rfl:    RELION PEN NEEDLES 31G X 6 MM MISC, INJECT 1 PEN INTO THE SKIN 2 TIMES DAILY, Disp: 100 each, Rfl: 0   sacubitril -valsartan  (ENTRESTO ) 24-26 MG, Take 1 tablet by mouth 2 (two) times daily., Disp: 180 tablet, Rfl: 1   Semaglutide, 2 MG/DOSE, (OZEMPIC, 2 MG/DOSE,) 8 MG/3ML SOPN, Inject 2 mg into the skin once a  week. Monday, Disp: , Rfl:    sildenafil  (VIAGRA ) 100 MG tablet, TAKE 1/2 TO 1 (ONE-HALF TO ONE) TABLET BY MOUTH ONCE DAILY AS NEEDED FOR ERECTILE DYSFUNCTION, Disp: 5 tablet, Rfl: 0   spironolactone  (ALDACTONE ) 25 MG tablet, Take 1 tablet (25 mg total) by mouth daily., Disp: 90 tablet, Rfl: 3   tadalafil  (CIALIS ) 20 MG tablet, Take 20 mg by mouth daily as needed. (Patient not taking: Reported on 03/11/2024), Disp: , Rfl:    Vitamin D , Ergocalciferol , (DRISDOL ) 1.25 MG (50000 UNIT) CAPS capsule, Take 1 capsule by mouth once a week (Patient not taking: Reported on 03/11/2024), Disp: 5 capsule, Rfl: 0   zinc gluconate 50 MG tablet, Take 50 mg by mouth daily. (Patient not taking: Reported on 03/04/2024), Disp: , Rfl:  No Known Allergies   Social History   Socioeconomic History   Marital status: Legally Separated    Spouse name: Not on file   Number of children: Not on file   Years of education: Not on file   Highest education level: Not on file  Occupational History   Not on file  Tobacco Use   Smoking status: Never    Passive exposure: Never   Smokeless tobacco: Never  Vaping Use   Vaping status: Never Used  Substance and Sexual Activity   Alcohol use: Not Currently    Alcohol/week: 2.0 standard drinks of alcohol    Types: 2 Cans of beer per week   Drug use: No   Sexual activity: Yes    Birth control/protection: None  Other Topics Concern   Not on file  Social History Narrative   Not on file   Social Drivers of Health   Financial Resource Strain: Medium Risk (05/24/2023)   Overall Financial Resource Strain (CARDIA)    Difficulty of Paying Living Expenses: Somewhat hard  Food Insecurity: No Food Insecurity (02/04/2024)   Hunger Vital Sign    Worried About Running Out of Food in the Last Year: Never true    Ran Out of Food in the Last Year: Never true  Transportation Needs: No Transportation Needs (05/24/2023)   PRAPARE - Administrator, Civil Service (Medical): No     Lack of Transportation (Non-Medical): No  Physical Activity: Sufficiently  Active (05/24/2023)   Exercise Vital Sign    Days of Exercise per Week: 7 days    Minutes of Exercise per Session: 30 min  Stress: No Stress Concern Present (05/24/2023)   [employer] of Occupational Health - Occupational Stress Questionnaire    Feeling of Stress : Not at all  Social Connections: Moderately Isolated (05/24/2023)   Social Connection and Isolation Panel [NHANES]    Frequency of Communication with Friends and Family: More than three times a week    Frequency of Social Gatherings with Friends and Family: More than three times a week    Attends Religious Services: Never    Database administrator or Organizations: No    Attends Banker Meetings: Never    Marital Status: Living with partner  Intimate Partner Violence: Not At Risk (05/24/2023)   Humiliation, Afraid, Rape, and Kick questionnaire    Fear of Current or Ex-Partner: No    Emotionally Abused: No    Physically Abused: No    Sexually Abused: No    Physical Exam      Future Appointments  Date Time Provider Department Center  05/12/2024  9:40 AM Joel Morel, NP [facility] None  05/29/2024 12:30 PM [facility]-ANNUAL WELLNESS VISIT [facility] None  06/05/2024  7:40 AM [facility] DEVICE REMOTES [facility]  09/05/2024  7:40 AM [facility] DEVICE REMOTES [facility]

## 2024-03-18 ENCOUNTER — Telehealth: Payer: Self-pay | Admitting: Pharmacist

## 2024-03-18 ENCOUNTER — Telehealth: Payer: Self-pay | Admitting: *Deleted

## 2024-03-18 DIAGNOSIS — G4733 Obstructive sleep apnea (adult) (pediatric): Secondary | ICD-10-CM

## 2024-03-18 DIAGNOSIS — I10 Essential (primary) hypertension: Secondary | ICD-10-CM

## 2024-03-18 NOTE — Telephone Encounter (Signed)
-----   Message from Gaylyn Keas sent at 03/17/2024 12:53 PM EDT ----- Please let patient know that they have sleep apnea.  Recommend therapeutic CPAP titration for treatment of patient's sleep disordered breathing.

## 2024-03-18 NOTE — Telephone Encounter (Addendum)
 The patient has been notified of the result lmtcb.  The patient has been notified of the result and verbalized understanding.  All questions (if any) were answered. Gaylene Kays, CMA 03/19/2024 12:35 PM    Correction - no CPAP titration >> please refer to Dr. Tellis Feathers for workup for [facility]

## 2024-03-18 NOTE — Telephone Encounter (Signed)
 Received Ozempic 2 mg 4 pens form [facility]. Patient informed. Will pick them up today or tomorrow (03/19/2024)

## 2024-03-19 NOTE — Telephone Encounter (Signed)
 Spoke with pt regarding referral. Pt aware of referral to Dr. Virgina Grills for [facility] Device. Referral was ordered. Pt verbalized understanding. All questions if any were answered.

## 2024-03-25 ENCOUNTER — Other Ambulatory Visit (HOSPITAL_COMMUNITY): Payer: Self-pay | Admitting: Cardiology

## 2024-03-25 ENCOUNTER — Other Ambulatory Visit (HOSPITAL_COMMUNITY): Payer: Self-pay

## 2024-03-25 ENCOUNTER — Telehealth (HOSPITAL_COMMUNITY): Payer: Self-pay

## 2024-03-25 MED ORDER — EMPAGLIFLOZIN 10 MG PO TABS: 10.0000 mg | ORAL_TABLET | Freq: Every day | ORAL | 3 refills | Status: DC

## 2024-03-25 NOTE — Telephone Encounter (Signed)
 HF Paramedicine Message to [facility]  Pharmacy (if applicable): [facility] on [address] [facility]    Issue/reason for call:  Refill needed for Jardiance  sent to [place] on [address] [facility]   Medication refill?   Jardiance      Roberts Ching, EMT-Paramedic [phone] 03/25/2024

## 2024-03-25 NOTE — Progress Notes (Signed)
 Paramedicine Encounter    Patient ID: [name], male    DOB: 1961-02-12, 63 y.o.   MRN: [id number]    Compliance with meds- missed one noon dose of meds   Pill box filled- one week   Refills needed- jardiance    Meds changes since last visit- none     Social changes- none    VISIT SUMMARY- Met with [name] for a med rec today as he had an appointment to make so our visit was limited on time. He reports feeling well with no concerns this week. I reviewed his meds and filled pill box for one week. [facility] as [facility] report they need a refill as its expired- I messaged triage for same. He was referred to [facility] for Inspire- I will follow up on referral next weeks visit. Home visit complete.       ACTION: Home visit completed     Patient Care Team: Jerrlyn Morel, NP as PCP - General (Pulmonary Disease) Jacqueline Matsu, MD as PCP - Cardiology (Cardiology) Darlis Eisenmenger, MD as PCP - Advanced Heart Failure (Cardiology) Boyce Byes, MD as PCP - Electrophysiology (Cardiology) Denton Flakes, LCSW as Social Worker (Licensed Clinical Social Worker) Lindle Rhea, MD (Inactive) as Consulting Physician (Gastroenterology)  Patient Active Problem List   Diagnosis Date Noted   PVC's (premature ventricular contractions) 09/28/2023   Acute right-sided low back pain with right-sided sciatica 05/14/2023   Multiple joint pain 12/22/2022   Obesity (BMI 35.0-39.9 without comorbidity) 07/18/2022   Cellulitis 06/22/2022   Cellulitis, leg 06/20/2022   Presence of Watchman left atrial appendage closure device 10/28/2021   Atrial fibrillation (HCC) 10/27/2021   Melena    Chronic diastolic CHF (congestive heart failure) (HCC)    GI bleed 06/29/2021   Severe anemia 06/29/2021   Adenomatous polyp of ascending colon    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   Acute blood loss anemia    Angiodysplasia of stomach     Chronic anticoagulation    CHF exacerbation (HCC) 06/15/2020   AKI (acute kidney injury) (HCC) 06/15/2020   CKD (chronic kidney disease), stage III (HCC) 06/15/2020   Anemia due to chronic blood loss    Gastric AVM    Angiodysplasia of duodenum with hemorrhage    Chronic systolic CHF (congestive heart failure) (HCC) 04/05/2020   Anemia 04/05/2020   PAF (paroxysmal atrial fibrillation) (HCC) 04/05/2020   OSA (obstructive sleep apnea) 04/05/2020   Syncope and collapse 04/05/2020   Type 2 diabetes mellitus with hyperglycemia, with long-term current use of insulin  (HCC) 12/03/2019   Diabetic polyneuropathy associated with type 2 diabetes mellitus (HCC) 12/03/2019   Erectile dysfunction 12/03/2019   Paranoid schizophrenia, chronic condition (HCC) 01/26/2015   Severe recurrent major depressive disorder with psychotic features (HCC) 01/26/2015   GAD (generalized anxiety disorder) 01/26/2015   OCD (obsessive compulsive disorder) 01/26/2015   Panic disorder without agoraphobia 01/26/2015   Insomnia 01/26/2015    Current Outpatient Medications:    acetaminophen  (TYLENOL ) 500 MG tablet, Take 2 tablets (1,000 mg total) by mouth 2 (two) times daily as needed. (Patient taking differently: Take 1,000 mg by mouth 2 (two) times daily as needed. Takes 1000mg  morning and night.), Disp: 180 tablet, Rfl: 3   albuterol  (VENTOLIN  HFA) 108 (90 Base) MCG/ACT inhaler, Inhale 2 puffs into the lungs every 6 (six) hours as needed for wheezing or shortness of breath., Disp: 18 g, Rfl: 1   aspirin  EC 81 MG  tablet, Take 81 mg by mouth daily. Swallow whole., Disp: , Rfl:    atorvastatin  (LIPITOR) 40 MG tablet, Take 1 tablet by mouth once daily, Disp: 90 tablet, Rfl: 1   blood glucose meter kit and supplies KIT, Dispense based on patient and insurance preference. Use up to four times daily as directed. (FOR ICD-9 250.00, 250.01)., Disp: 1 each, Rfl: 0   busPIRone  (BUSPAR ) 10 MG tablet, Take 1 tablet (10 mg total) by  mouth 2 (two) times daily., Disp: 60 tablet, Rfl: 2   colchicine  0.6 MG tablet, Daily for 7 days then daily PRN for gout flare, Disp: 30 tablet, Rfl: 0   Continuous Glucose Receiver (FREESTYLE LIBRE 3 READER) DEVI, 1 each by Does not apply route daily. Use to check glucose continuously, Disp: 1 each, Rfl: 1   Continuous Glucose Sensor (FREESTYLE LIBRE 3 SENSOR) MISC, Place 1 sensor on the skin every 14 days. Use to check glucose continuously, Disp: 6 each, Rfl: 2   diclofenac  Sodium (VOLTAREN) 1 % GEL, Apply 2 g topically daily as needed for pain., Disp: , Rfl:    DULoxetine  (CYMBALTA ) 30 MG capsule, Take 1 capsule by mouth once daily, Disp: 30 capsule, Rfl: 2   empagliflozin  (JARDIANCE ) 10 MG TABS tablet, TAKE 1 TABLET BY MOUTH ONCE DAILY ., Disp: 90 tablet, Rfl: 3   escitalopram  (LEXAPRO ) 10 MG tablet, Take 10 mg by mouth daily. Prescribed by Dr. Ardeth Beckers. Dimkpa, Disp: , Rfl:    furosemide  (LASIX ) 40 MG tablet, Take 1.5 tablets (60 mg total) by mouth 2 (two) times daily., Disp: 90 tablet, Rfl: 3   gabapentin  (NEURONTIN ) 800 MG tablet, TAKE 1 TABLET BY MOUTH THREE TIMES DAILY, Disp: 90 tablet, Rfl: 0   HUMALOG  MIX 75/25 KWIKPEN (75-25) 100 UNIT/ML KwikPen, DIAL 30 UNITS AND INJECT UNDER THE SKIN TWICE DAILY. MAX DAILY DOSE: 60 UNITS., Disp: 60 mL, Rfl: 0   metFORMIN  (GLUCOPHAGE -XR) 500 MG 24 hr tablet, TAKE 1 TABLET BY MOUTH TWICE DAILY WITH A MEAL, Disp: 60 tablet, Rfl: 0   metoprolol  succinate (TOPROL -XL) 50 MG 24 hr tablet, Take 1.5 tablets (75 mg total) by mouth daily., Disp: 45 tablet, Rfl: 11   Multiple Vitamins-Minerals (CENTRUM SILVER 50+MEN) TABS, Take 1 tablet by mouth daily., Disp: , Rfl:    nicotine  (NICODERM CQ  - DOSED IN MG/24 HOURS) 14 mg/24hr patch, Place 1 patch (14 mg total) onto the skin daily., Disp: 28 patch, Rfl: 0   nicotine  (NICODERM CQ  - DOSED IN MG/24 HOURS) 21 mg/24hr patch, Place 1 patch (21 mg total) onto the skin daily., Disp: 28 patch, Rfl: 0   nicotine  (NICODERM CQ  - DOSED  IN MG/24 HR) 7 mg/24hr patch, Place 1 patch (7 mg total) onto the skin daily., Disp: 28 patch, Rfl: 0   Omega-3 Fatty Acids (FISH OIL) 1000 MG CAPS, Take 1,000 mg by mouth at bedtime., Disp: , Rfl:    pantoprazole  (PROTONIX ) 40 MG tablet, Take 1 tablet by mouth once daily, Disp: 90 tablet, Rfl: 0   potassium chloride  SA (KLOR-CON  M) 20 MEQ tablet, Take 2 tablets (40 mEq total) by mouth daily., Disp: 60 tablet, Rfl: 6   QUEtiapine  (SEROQUEL ) 200 MG tablet, Take 200 mg by mouth at bedtime., Disp: , Rfl:    RELION PEN NEEDLES 31G X 6 MM MISC, INJECT 1 PEN INTO THE SKIN 2 TIMES DAILY, Disp: 100 each, Rfl: 0   sacubitril -valsartan  (ENTRESTO ) 24-26 MG, Take 1 tablet by mouth 2 (two) times daily., Disp: 180 tablet,  Rfl: 1   Semaglutide, 2 MG/DOSE, (OZEMPIC, 2 MG/DOSE,) 8 MG/3ML SOPN, Inject 2 mg into the skin once a week. Monday, Disp: , Rfl:    sildenafil  (VIAGRA ) 100 MG tablet, TAKE 1/2 TO 1 (ONE-HALF TO ONE) TABLET BY MOUTH ONCE DAILY AS NEEDED FOR ERECTILE DYSFUNCTION, Disp: 5 tablet, Rfl: 0   spironolactone  (ALDACTONE ) 25 MG tablet, Take 1 tablet (25 mg total) by mouth daily., Disp: 90 tablet, Rfl: 3   tadalafil  (CIALIS ) 20 MG tablet, Take 20 mg by mouth daily as needed., Disp: , Rfl:    Vitamin D , Ergocalciferol , (DRISDOL ) 1.25 MG (50000 UNIT) CAPS capsule, Take 1 capsule by mouth once a week, Disp: 5 capsule, Rfl: 0   zinc gluconate 50 MG tablet, Take 50 mg by mouth daily., Disp: , Rfl:    ferrous sulfate  325 (65 FE) MG tablet, Take 1 tablet (325 mg total) by mouth every other day., Disp: 30 tablet, Rfl: 1 No Known Allergies   Social History   Socioeconomic History   Marital status: Legally Separated    Spouse name: Not on file   Number of children: Not on file   Years of education: Not on file   Highest education level: Not on file  Occupational History   Not on file  Tobacco Use   Smoking status: Never    Passive exposure: Never   Smokeless tobacco: Never  Vaping Use   Vaping  status: Never Used  Substance and Sexual Activity   Alcohol use: Not Currently    Alcohol/week: 2.0 standard drinks of alcohol    Types: 2 Cans of beer per week   Drug use: No   Sexual activity: Yes    Birth control/protection: None  Other Topics Concern   Not on file  Social History Narrative   Not on file   Social Drivers of Health   Financial Resource Strain: Medium Risk (05/24/2023)   Overall Financial Resource Strain (CARDIA)    Difficulty of Paying Living Expenses: Somewhat hard  Food Insecurity: No Food Insecurity (02/04/2024)   Hunger Vital Sign    Worried About Running Out of Food in the Last Year: Never true    Ran Out of Food in the Last Year: Never true  Transportation Needs: No Transportation Needs (05/24/2023)   PRAPARE - Administrator, Civil Service (Medical): No    Lack of Transportation (Non-Medical): No  Physical Activity: Sufficiently Active (05/24/2023)   Exercise Vital Sign    Days of Exercise per Week: 7 days    Minutes of Exercise per Session: 30 min  Stress: No Stress Concern Present (05/24/2023)   [employer] of Occupational Health - Occupational Stress Questionnaire    Feeling of Stress : Not at all  Social Connections: Moderately Isolated (05/24/2023)   Social Connection and Isolation Panel [NHANES]    Frequency of Communication with Friends and Family: More than three times a week    Frequency of Social Gatherings with Friends and Family: More than three times a week    Attends Religious Services: Never    Database administrator or Organizations: No    Attends Banker Meetings: Never    Marital Status: Living with partner  Intimate Partner Violence: Not At Risk (05/24/2023)   Humiliation, Afraid, Rape, and Kick questionnaire    Fear of Current or Ex-Partner: No    Emotionally Abused: No    Physically Abused: No    Sexually Abused: No    Physical Exam  Future Appointments  Date Time Provider Department  Center  05/12/2024  9:40 AM Jerrlyn Morel, NP [facility] None  05/29/2024 12:30 PM [facility]-ANNUAL WELLNESS VISIT [facility] None  06/05/2024  7:40 AM [facility] DEVICE REMOTES [facility]  09/05/2024  7:40 AM [facility] DEVICE REMOTES [facility]

## 2024-04-01 ENCOUNTER — Telehealth (HOSPITAL_COMMUNITY): Payer: Self-pay

## 2024-04-01 ENCOUNTER — Telehealth: Payer: Self-pay | Admitting: Pharmacy Technician

## 2024-04-01 ENCOUNTER — Other Ambulatory Visit (HOSPITAL_COMMUNITY): Payer: Self-pay

## 2024-04-01 MED ORDER — EMPAGLIFLOZIN 10 MG PO TABS: 10.0000 mg | ORAL_TABLET | Freq: Every day | ORAL | 3 refills | Status: DC

## 2024-04-01 NOTE — Telephone Encounter (Signed)
 HF Paramedicine Message to [facility]  Pharmacy (if applicable): [facility] [address]   Issue/reason for call:  Med Expired at Pharmacy   Medication refill?    Jardiance  needing to be sent to [facility] on [address]    Roberts Ching, EMT-Paramedic [phone] 04/01/2024

## 2024-04-01 NOTE — Progress Notes (Signed)
 Paramedicine Encounter    Patient ID: [name], male    DOB: 1961-10-24, 62 y.o.   MRN: [id number]   Complaints- no complaints   Assessment- CAOX4, warm and dry reporting to be feeling well with no complaints. No lower leg swelling, lungs clear. Vitals as noted. CBG- 170 just had a meal   Compliance with meds- missed two night doses   Pill box filled- for one week   Refills needed- jardiance    Meds changes since last visit- none     Social changes- none    VISIT SUMMARY- Arrived for home visit for [facility] who reports to be feeling well with no complaints. He denied any shortness of breath, dizziness or chest pain. No lower leg swelling. Lungs clear. Vitals within normal limits. He missed two med doses at night over the last week. I reviewed meds and confirmed same educating importance of compliance. I filled pill box for one week. I reviewed upcoming appointments. Contacted referral dept for [facility] for [facility]- left a VM with them. Provided [name] the info for making an eye appointment. Home visit complete. I will see [name] in one week.   BP 98/62   Pulse 79   Resp 16   Wt 277 lb (125.6 kg)   SpO2 98%   BMI 35.56 kg/m  Weight yesterday-- didn't weigh  Last visit weight-272 lbs on 4/21      ACTION: Home visit completed     Patient Care Team: Jerrlyn Morel, NP as PCP - General (Pulmonary Disease) Jacqueline Matsu, MD as PCP - Cardiology (Cardiology) Darlis Eisenmenger, MD as PCP - Advanced Heart Failure (Cardiology) Boyce Byes, MD as PCP - Electrophysiology (Cardiology) Denton Flakes, LCSW as Social Worker (Licensed Clinical Social Worker) Lindle Rhea, MD (Inactive) as Consulting Physician (Gastroenterology)  Patient Active Problem List   Diagnosis Date Noted   PVC's (premature ventricular contractions) 09/28/2023   Acute right-sided low back pain with right-sided sciatica 05/14/2023   Multiple joint pain 12/22/2022   Obesity (BMI 35.0-39.9  without comorbidity) 07/18/2022   Cellulitis 06/22/2022   Cellulitis, leg 06/20/2022   Presence of Watchman left atrial appendage closure device 10/28/2021   Atrial fibrillation (HCC) 10/27/2021   Melena    Chronic diastolic CHF (congestive heart failure) (HCC)    GI bleed 06/29/2021   Severe anemia 06/29/2021   Adenomatous polyp of ascending colon    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   Acute blood loss anemia    Angiodysplasia of stomach    Chronic anticoagulation    CHF exacerbation (HCC) 06/15/2020   AKI (acute kidney injury) (HCC) 06/15/2020   CKD (chronic kidney disease), stage III (HCC) 06/15/2020   Anemia due to chronic blood loss    Gastric AVM    Angiodysplasia of duodenum with hemorrhage    Chronic systolic CHF (congestive heart failure) (HCC) 04/05/2020   Anemia 04/05/2020   PAF (paroxysmal atrial fibrillation) (HCC) 04/05/2020   OSA (obstructive sleep apnea) 04/05/2020   Syncope and collapse 04/05/2020   Type 2 diabetes mellitus with hyperglycemia, with long-term current use of insulin  (HCC) 12/03/2019   Diabetic polyneuropathy associated with type 2 diabetes mellitus (HCC) 12/03/2019   Erectile dysfunction 12/03/2019   Paranoid schizophrenia, chronic condition (HCC) 01/26/2015   Severe recurrent major depressive disorder with psychotic features (HCC) 01/26/2015   GAD (generalized anxiety disorder) 01/26/2015   OCD (obsessive compulsive disorder) 01/26/2015   Panic disorder without agoraphobia 01/26/2015   Insomnia 01/26/2015  Current Outpatient Medications:    acetaminophen  (TYLENOL ) 500 MG tablet, Take 2 tablets (1,000 mg total) by mouth 2 (two) times daily as needed., Disp: 180 tablet, Rfl: 3   albuterol  (VENTOLIN  HFA) 108 (90 Base) MCG/ACT inhaler, Inhale 2 puffs into the lungs every 6 (six) hours as needed for wheezing or shortness of breath., Disp: 18 g, Rfl: 1   aspirin  EC 81 MG tablet, Take 81 mg by mouth daily. Swallow  whole., Disp: , Rfl:    atorvastatin  (LIPITOR) 40 MG tablet, Take 1 tablet by mouth once daily, Disp: 90 tablet, Rfl: 1   blood glucose meter kit and supplies KIT, Dispense based on patient and insurance preference. Use up to four times daily as directed. (FOR ICD-9 250.00, 250.01)., Disp: 1 each, Rfl: 0   busPIRone  (BUSPAR ) 10 MG tablet, Take 1 tablet (10 mg total) by mouth 2 (two) times daily., Disp: 60 tablet, Rfl: 2   colchicine  0.6 MG tablet, Daily for 7 days then daily PRN for gout flare, Disp: 30 tablet, Rfl: 0   Continuous Glucose Receiver (FREESTYLE LIBRE 3 READER) DEVI, 1 each by Does not apply route daily. Use to check glucose continuously, Disp: 1 each, Rfl: 1   Continuous Glucose Sensor (FREESTYLE LIBRE 3 SENSOR) MISC, Place 1 sensor on the skin every 14 days. Use to check glucose continuously, Disp: 6 each, Rfl: 2   diclofenac  Sodium (VOLTAREN) 1 % GEL, Apply 2 g topically daily as needed for pain., Disp: , Rfl:    DULoxetine  (CYMBALTA ) 30 MG capsule, Take 1 capsule by mouth once daily (Patient not taking: Reported on 04/01/2024), Disp: 30 capsule, Rfl: 2   empagliflozin  (JARDIANCE ) 10 MG TABS tablet, Take 1 tablet (10 mg total) by mouth daily., Disp: 90 tablet, Rfl: 3   escitalopram  (LEXAPRO ) 10 MG tablet, Take 10 mg by mouth daily. Prescribed by Dr. Ardeth Beckers. Dimkpa (Patient not taking: Reported on 04/01/2024), Disp: , Rfl:    ferrous sulfate  325 (65 FE) MG tablet, Take 1 tablet (325 mg total) by mouth every other day., Disp: 30 tablet, Rfl: 1   furosemide  (LASIX ) 40 MG tablet, Take 1.5 tablets (60 mg total) by mouth 2 (two) times daily., Disp: 90 tablet, Rfl: 3   gabapentin  (NEURONTIN ) 800 MG tablet, TAKE 1 TABLET BY MOUTH THREE TIMES DAILY, Disp: 90 tablet, Rfl: 0   HUMALOG  MIX 75/25 KWIKPEN (75-25) 100 UNIT/ML KwikPen, DIAL 30 UNITS AND INJECT UNDER THE SKIN TWICE DAILY. MAX DAILY DOSE: 60 UNITS., Disp: 60 mL, Rfl: 0   metFORMIN  (GLUCOPHAGE -XR) 500 MG 24 hr tablet, TAKE 1 TABLET BY MOUTH TWICE  DAILY WITH A MEAL, Disp: 60 tablet, Rfl: 0   metoprolol  succinate (TOPROL -XL) 50 MG 24 hr tablet, Take 1.5 tablets (75 mg total) by mouth daily., Disp: 45 tablet, Rfl: 11   Multiple Vitamins-Minerals (CENTRUM SILVER 50+MEN) TABS, Take 1 tablet by mouth daily., Disp: , Rfl:    nicotine  (NICODERM CQ  - DOSED IN MG/24 HOURS) 14 mg/24hr patch, Place 1 patch (14 mg total) onto the skin daily. (Patient not taking: Reported on 04/01/2024), Disp: 28 patch, Rfl: 0   nicotine  (NICODERM CQ  - DOSED IN MG/24 HOURS) 21 mg/24hr patch, Place 1 patch (21 mg total) onto the skin daily. (Patient not taking: Reported on 04/01/2024), Disp: 28 patch, Rfl: 0   nicotine  (NICODERM CQ  - DOSED IN MG/24 HR) 7 mg/24hr patch, Place 1 patch (7 mg total) onto the skin daily. (Patient not taking: Reported on 04/01/2024), Disp: 28 patch, Rfl:  0   Omega-3 Fatty Acids (FISH OIL) 1000 MG CAPS, Take 1,000 mg by mouth at bedtime., Disp: , Rfl:    pantoprazole  (PROTONIX ) 40 MG tablet, Take 1 tablet by mouth once daily, Disp: 90 tablet, Rfl: 0   potassium chloride  SA (KLOR-CON  M) 20 MEQ tablet, Take 2 tablets (40 mEq total) by mouth daily., Disp: 60 tablet, Rfl: 6   QUEtiapine  (SEROQUEL ) 200 MG tablet, Take 200 mg by mouth at bedtime., Disp: , Rfl:    RELION PEN NEEDLES 31G X 6 MM MISC, INJECT 1 PEN INTO THE SKIN 2 TIMES DAILY, Disp: 100 each, Rfl: 0   sacubitril -valsartan  (ENTRESTO ) 24-26 MG, Take 1 tablet by mouth 2 (two) times daily., Disp: 180 tablet, Rfl: 1   Semaglutide, 2 MG/DOSE, (OZEMPIC, 2 MG/DOSE,) 8 MG/3ML SOPN, Inject 2 mg into the skin once a week. Monday, Disp: , Rfl:    sildenafil  (VIAGRA ) 100 MG tablet, TAKE 1/2 TO 1 (ONE-HALF TO ONE) TABLET BY MOUTH ONCE DAILY AS NEEDED FOR ERECTILE DYSFUNCTION, Disp: 5 tablet, Rfl: 0   spironolactone  (ALDACTONE ) 25 MG tablet, Take 1 tablet (25 mg total) by mouth daily., Disp: 90 tablet, Rfl: 3   tadalafil  (CIALIS ) 20 MG tablet, Take 20 mg by mouth daily as needed. (Patient not taking: Reported on  04/01/2024), Disp: , Rfl:    Vitamin D , Ergocalciferol , (DRISDOL ) 1.25 MG (50000 UNIT) CAPS capsule, Take 1 capsule by mouth once a week (Patient not taking: Reported on 04/01/2024), Disp: 5 capsule, Rfl: 0   zinc gluconate 50 MG tablet, Take 50 mg by mouth daily. (Patient not taking: Reported on 04/01/2024), Disp: , Rfl:  No Known Allergies   Social History   Socioeconomic History   Marital status: Legally Separated    Spouse name: Not on file   Number of children: Not on file   Years of education: Not on file   Highest education level: Not on file  Occupational History   Not on file  Tobacco Use   Smoking status: Never    Passive exposure: Never   Smokeless tobacco: Never  Vaping Use   Vaping status: Never Used  Substance and Sexual Activity   Alcohol use: Not Currently    Alcohol/week: 2.0 standard drinks of alcohol    Types: 2 Cans of beer per week   Drug use: No   Sexual activity: Yes    Birth control/protection: None  Other Topics Concern   Not on file  Social History Narrative   Not on file   Social Drivers of Health   Financial Resource Strain: Medium Risk (05/24/2023)   Overall Financial Resource Strain (CARDIA)    Difficulty of Paying Living Expenses: Somewhat hard  Food Insecurity: No Food Insecurity (02/04/2024)   Hunger Vital Sign    Worried About Running Out of Food in the Last Year: Never true    Ran Out of Food in the Last Year: Never true  Transportation Needs: No Transportation Needs (05/24/2023)   PRAPARE - Administrator, Civil Service (Medical): No    Lack of Transportation (Non-Medical): No  Physical Activity: Sufficiently Active (05/24/2023)   Exercise Vital Sign    Days of Exercise per Week: 7 days    Minutes of Exercise per Session: 30 min  Stress: No Stress Concern Present (05/24/2023)   [employer] of Occupational Health - Occupational Stress Questionnaire    Feeling of Stress : Not at all  Social Connections: Moderately Isolated  (05/24/2023)   Social Connection  and Isolation Panel [NHANES]    Frequency of Communication with Friends and Family: More than three times a week    Frequency of Social Gatherings with Friends and Family: More than three times a week    Attends Religious Services: Never    Database administrator or Organizations: No    Attends Banker Meetings: Never    Marital Status: Living with partner  Intimate Partner Violence: Not At Risk (05/24/2023)   Humiliation, Afraid, Rape, and Kick questionnaire    Fear of Current or Ex-Partner: No    Emotionally Abused: No    Physically Abused: No    Sexually Abused: No    Physical Exam      Future Appointments  Date Time Provider Department Center  05/12/2024  9:40 AM Jerrlyn Morel, NP [facility] None  05/29/2024 12:30 PM [facility]-ANNUAL WELLNESS VISIT [facility] None  06/05/2024  7:40 AM [facility] HVT DEVICE REMOTES [facility]  09/05/2024  7:40 AM [facility]VT DEVICE REMOTES [facility]

## 2024-04-01 NOTE — Telephone Encounter (Signed)
 [employer] email with refill form for request of chge in address for ozempic  and scanned in media

## 2024-04-01 NOTE — Telephone Encounter (Signed)
 [facility] for [facility] consult appointment but [facility] reports they do not have referral. I will forward to Dr. Broadus Canes office to re-send to them.   FAX # [phone]    Roberts Ching, EMT-Paramedic [phone] 04/01/2024

## 2024-04-01 NOTE — Addendum Note (Signed)
 Addended by: Glorietta Lark on: 04/01/2024 02:51 PM   Modules accepted: Orders

## 2024-04-01 NOTE — Telephone Encounter (Signed)
 Refill sent.

## 2024-04-02 NOTE — Telephone Encounter (Signed)
 Spoke with [name] regarding referral to Dr. Tellis Feathers. Referral was sent 4/23, but Dr. Tellis Feathers' office does not have the referral. Will forward to sleep studies pool.

## 2024-04-03 ENCOUNTER — Telehealth: Payer: Self-pay

## 2024-04-03 NOTE — Telephone Encounter (Signed)
 Changed forms to [place] per [facility] request. Emailed updated forms to M.Maccia to have doc sign.

## 2024-04-08 ENCOUNTER — Other Ambulatory Visit (HOSPITAL_COMMUNITY): Payer: Self-pay | Admitting: Cardiology

## 2024-04-08 ENCOUNTER — Telehealth (HOSPITAL_COMMUNITY): Payer: Self-pay

## 2024-04-08 ENCOUNTER — Other Ambulatory Visit (HOSPITAL_COMMUNITY): Payer: Self-pay

## 2024-04-08 ENCOUNTER — Other Ambulatory Visit: Payer: Self-pay | Admitting: Nurse Practitioner

## 2024-04-08 DIAGNOSIS — E1165 Type 2 diabetes mellitus with hyperglycemia: Secondary | ICD-10-CM

## 2024-04-08 NOTE — Telephone Encounter (Signed)
 Mr. Pandolfo advised [facility] would not give him his Jardiance . I contacted pharmacy and they report that his [employer] [name] is expired. I will forward to CPhT for assistance.   Roberts Ching, EMT-Paramedic [phone] 04/08/2024

## 2024-04-08 NOTE — Progress Notes (Signed)
 Paramedicine Encounter    Patient ID: [name], male    DOB: 09-20-1961, 63 y.o.   MRN: [id number]   Complaints- arthritis pain in hands   Assessment--CAOX4, warm and dry reporting to be feeling good aside from hand pain with arthritis.   Compliance with meds- missed three night doses and one morning dose   Pill box filled- for one week   Refills needed Lasix  Jardiance  Buspirone  Metformin    Meds changes since last visit- none     Social changes- none    VISIT SUMMARY- Arrived for home visit for [name] who reports to be feeling good aside from hand pain with arthritis. He reports that he took tylenol  and used some icy hot on his hand. He also took a colchicine  thinking it was gout. I obtained vitals and weight is up 3lbs. He says he feels more bloated he reports he ate a banquet dinner last night which contained 1460mg  of sodium- we discussed this at length and the importance of eating low sodium diet and being med compliant. Meds reviewed and pill box filled for one week. [employer] [name] is expired I will message CPhT for assistance with this. Appointments reviewed and confirmed. Home visit complete.   BP 110/70   Pulse 86   Resp 16   Wt 280 lb (127 kg)   SpO2 96%   BMI 35.95 kg/m  Weight yesterday-- didn't weigh  Last visit weight--277lbs      ACTION: Home visit completed     Patient Care Team: Jerrlyn Morel, NP as PCP - General (Pulmonary Disease) Jacqueline Matsu, MD as PCP - Cardiology (Cardiology) Darlis Eisenmenger, MD as PCP - Advanced Heart Failure (Cardiology) Boyce Byes, MD as PCP - Electrophysiology (Cardiology) Denton Flakes, LCSW as Social Worker (Licensed Clinical Social Worker) Lindle Rhea, MD (Inactive) as Consulting Physician (Gastroenterology)  Patient Active Problem List   Diagnosis Date Noted   PVC's (premature ventricular contractions) 09/28/2023   Acute right-sided low back pain with right-sided sciatica  05/14/2023   Multiple joint pain 12/22/2022   Obesity (BMI 35.0-39.9 without comorbidity) 07/18/2022   Cellulitis 06/22/2022   Cellulitis, leg 06/20/2022   Presence of Watchman left atrial appendage closure device 10/28/2021   Atrial fibrillation (HCC) 10/27/2021   Melena    Chronic diastolic CHF (congestive heart failure) (HCC)    GI bleed 06/29/2021   Severe anemia 06/29/2021   Adenomatous polyp of ascending colon    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   Acute blood loss anemia    Angiodysplasia of stomach    Chronic anticoagulation    CHF exacerbation (HCC) 06/15/2020   AKI (acute kidney injury) (HCC) 06/15/2020   CKD (chronic kidney disease), stage III (HCC) 06/15/2020   Anemia due to chronic blood loss    Gastric AVM    Angiodysplasia of duodenum with hemorrhage    Chronic systolic CHF (congestive heart failure) (HCC) 04/05/2020   Anemia 04/05/2020   PAF (paroxysmal atrial fibrillation) (HCC) 04/05/2020   OSA (obstructive sleep apnea) 04/05/2020   Syncope and collapse 04/05/2020   Type 2 diabetes mellitus with hyperglycemia, with long-term current use of insulin  (HCC) 12/03/2019   Diabetic polyneuropathy associated with type 2 diabetes mellitus (HCC) 12/03/2019   Erectile dysfunction 12/03/2019   Paranoid schizophrenia, chronic condition (HCC) 01/26/2015   Severe recurrent major depressive disorder with psychotic features (HCC) 01/26/2015   GAD (generalized anxiety disorder) 01/26/2015   OCD (obsessive compulsive disorder) 01/26/2015   Panic disorder without  agoraphobia 01/26/2015   Insomnia 01/26/2015    Current Outpatient Medications:    acetaminophen  (TYLENOL ) 500 MG tablet, Take 2 tablets (1,000 mg total) by mouth 2 (two) times daily as needed., Disp: 180 tablet, Rfl: 3   albuterol  (VENTOLIN  HFA) 108 (90 Base) MCG/ACT inhaler, Inhale 2 puffs into the lungs every 6 (six) hours as needed for wheezing or shortness of breath., Disp: 18 g, Rfl:  1   atorvastatin  (LIPITOR) 40 MG tablet, Take 1 tablet by mouth once daily, Disp: 90 tablet, Rfl: 1   blood glucose meter kit and supplies KIT, Dispense based on patient and insurance preference. Use up to four times daily as directed. (FOR ICD-9 250.00, 250.01)., Disp: 1 each, Rfl: 0   busPIRone  (BUSPAR ) 10 MG tablet, Take 1 tablet (10 mg total) by mouth 2 (two) times daily., Disp: 60 tablet, Rfl: 2   colchicine  0.6 MG tablet, Daily for 7 days then daily PRN for gout flare, Disp: 30 tablet, Rfl: 0   Continuous Glucose Receiver (FREESTYLE LIBRE 3 READER) DEVI, 1 each by Does not apply route daily. Use to check glucose continuously, Disp: 1 each, Rfl: 1   Continuous Glucose Sensor (FREESTYLE LIBRE 3 SENSOR) MISC, Place 1 sensor on the skin every 14 days. Use to check glucose continuously, Disp: 6 each, Rfl: 2   diclofenac  Sodium (VOLTAREN) 1 % GEL, Apply 2 g topically daily as needed for pain., Disp: , Rfl:    empagliflozin  (JARDIANCE ) 10 MG TABS tablet, Take 1 tablet (10 mg total) by mouth daily., Disp: 90 tablet, Rfl: 3   ferrous sulfate  325 (65 FE) MG tablet, Take 1 tablet (325 mg total) by mouth every other day., Disp: 30 tablet, Rfl: 1   furosemide  (LASIX ) 40 MG tablet, Take 1.5 tablets (60 mg total) by mouth 2 (two) times daily., Disp: 90 tablet, Rfl: 3   gabapentin  (NEURONTIN ) 800 MG tablet, TAKE 1 TABLET BY MOUTH THREE TIMES DAILY, Disp: 90 tablet, Rfl: 0   HUMALOG  MIX 75/25 KWIKPEN (75-25) 100 UNIT/ML KwikPen, DIAL 30 UNITS AND INJECT UNDER THE SKIN TWICE DAILY. MAX DAILY DOSE: 60 UNITS., Disp: 60 mL, Rfl: 0   metFORMIN  (GLUCOPHAGE -XR) 500 MG 24 hr tablet, TAKE 1 TABLET BY MOUTH TWICE DAILY WITH A MEAL, Disp: 60 tablet, Rfl: 0   metoprolol  succinate (TOPROL -XL) 50 MG 24 hr tablet, Take 1.5 tablets (75 mg total) by mouth daily., Disp: 45 tablet, Rfl: 11   Multiple Vitamins-Minerals (CENTRUM SILVER 50+MEN) TABS, Take 1 tablet by mouth daily., Disp: , Rfl:    Omega-3 Fatty Acids (FISH OIL) 1000  MG CAPS, Take 1,000 mg by mouth at bedtime., Disp: , Rfl:    pantoprazole  (PROTONIX ) 40 MG tablet, Take 1 tablet by mouth once daily, Disp: 90 tablet, Rfl: 0   potassium chloride  SA (KLOR-CON  M) 20 MEQ tablet, Take 2 tablets (40 mEq total) by mouth daily., Disp: 60 tablet, Rfl: 6   QUEtiapine  (SEROQUEL ) 200 MG tablet, Take 200 mg by mouth at bedtime., Disp: , Rfl:    RELION PEN NEEDLES 31G X 6 MM MISC, INJECT 1 PEN INTO THE SKIN 2 TIMES DAILY, Disp: 100 each, Rfl: 0   sacubitril -valsartan  (ENTRESTO ) 24-26 MG, Take 1 tablet by mouth 2 (two) times daily., Disp: 180 tablet, Rfl: 1   Semaglutide, 2 MG/DOSE, (OZEMPIC, 2 MG/DOSE,) 8 MG/3ML SOPN, Inject 2 mg into the skin once a week. Monday, Disp: , Rfl:    sildenafil  (VIAGRA ) 100 MG tablet, TAKE 1/2 TO 1 (ONE-HALF TO  ONE) TABLET BY MOUTH ONCE DAILY AS NEEDED FOR ERECTILE DYSFUNCTION, Disp: 5 tablet, Rfl: 0   spironolactone  (ALDACTONE ) 25 MG tablet, Take 1 tablet (25 mg total) by mouth daily., Disp: 90 tablet, Rfl: 3   aspirin  EC 81 MG tablet, Take 81 mg by mouth daily. Swallow whole. (Patient not taking: Reported on 04/08/2024), Disp: , Rfl:    DULoxetine  (CYMBALTA ) 30 MG capsule, Take 1 capsule by mouth once daily (Patient not taking: Reported on 04/08/2024), Disp: 30 capsule, Rfl: 2   escitalopram  (LEXAPRO ) 10 MG tablet, Take 10 mg by mouth daily. Prescribed by Dr. Ardeth Beckers. Dimkpa (Patient not taking: Reported on 04/01/2024), Disp: , Rfl:    nicotine  (NICODERM CQ  - DOSED IN MG/24 HOURS) 14 mg/24hr patch, Place 1 patch (14 mg total) onto the skin daily. (Patient not taking: Reported on 04/08/2024), Disp: 28 patch, Rfl: 0   nicotine  (NICODERM CQ  - DOSED IN MG/24 HOURS) 21 mg/24hr patch, Place 1 patch (21 mg total) onto the skin daily. (Patient not taking: Reported on 04/08/2024), Disp: 28 patch, Rfl: 0   nicotine  (NICODERM CQ  - DOSED IN MG/24 HR) 7 mg/24hr patch, Place 1 patch (7 mg total) onto the skin daily. (Patient not taking: Reported on 04/08/2024), Disp: 28  patch, Rfl: 0   tadalafil  (CIALIS ) 20 MG tablet, Take 20 mg by mouth daily as needed. (Patient not taking: Reported on 04/08/2024), Disp: , Rfl:    Vitamin D , Ergocalciferol , (DRISDOL ) 1.25 MG (50000 UNIT) CAPS capsule, Take 1 capsule by mouth once a week (Patient not taking: Reported on 04/08/2024), Disp: 5 capsule, Rfl: 0   zinc gluconate 50 MG tablet, Take 50 mg by mouth daily. (Patient not taking: Reported on 04/01/2024), Disp: , Rfl:  No Known Allergies   Social History   Socioeconomic History   Marital status: Legally Separated    Spouse name: Not on file   Number of children: Not on file   Years of education: Not on file   Highest education level: Not on file  Occupational History   Not on file  Tobacco Use   Smoking status: Never    Passive exposure: Never   Smokeless tobacco: Never  Vaping Use   Vaping status: Never Used  Substance and Sexual Activity   Alcohol use: Not Currently    Alcohol/week: 2.0 standard drinks of alcohol    Types: 2 Cans of beer per week   Drug use: No   Sexual activity: Yes    Birth control/protection: None  Other Topics Concern   Not on file  Social History Narrative   Not on file   Social Drivers of Health   Financial Resource Strain: Medium Risk (05/24/2023)   Overall Financial Resource Strain (CARDIA)    Difficulty of Paying Living Expenses: Somewhat hard  Food Insecurity: No Food Insecurity (02/04/2024)   Hunger Vital Sign    Worried About Running Out of Food in the Last Year: Never true    Ran Out of Food in the Last Year: Never true  Transportation Needs: No Transportation Needs (05/24/2023)   PRAPARE - Administrator, Civil Service (Medical): No    Lack of Transportation (Non-Medical): No  Physical Activity: Sufficiently Active (05/24/2023)   Exercise Vital Sign    Days of Exercise per Week: 7 days    Minutes of Exercise per Session: 30 min  Stress: No Stress Concern Present (05/24/2023)   [employer] of  Occupational Health - Occupational Stress Questionnaire    Feeling of  Stress : Not at all  Social Connections: Moderately Isolated (05/24/2023)   Social Connection and Isolation Panel [NHANES]    Frequency of Communication with Friends and Family: More than three times a week    Frequency of Social Gatherings with Friends and Family: More than three times a week    Attends Religious Services: Never    Database administrator or Organizations: No    Attends Banker Meetings: Never    Marital Status: Living with partner  Intimate Partner Violence: Not At Risk (05/24/2023)   Humiliation, Afraid, Rape, and Kick questionnaire    Fear of Current or Ex-Partner: No    Emotionally Abused: No    Physically Abused: No    Sexually Abused: No    Physical Exam      Future Appointments  Date Time Provider Department Center  05/12/2024  9:40 AM Jerrlyn Morel, NP [facility] None  05/29/2024 12:30 PM [facility]-ANNUAL WELLNESS VISIT [facility] None  06/05/2024  7:40 AM [facility] HVT DEVICE REMOTES [facility] H[facility]V  09/05/2024  7:40 AM [facility] HVT DEVICE REMOTES [facility] H&V

## 2024-04-10 ENCOUNTER — Telehealth (HOSPITAL_COMMUNITY): Payer: Self-pay

## 2024-04-10 NOTE — Telephone Encounter (Signed)
 Advanced Heart Failure Patient Advocate Encounter  The patient was approved for a [employer] grant that will help cover the cost of Entresto , Jardiance , Metoprolol , Spironolactone .  Total amount awarded, $4,500.  Effective: 03/20/2024 - 03/19/2025.  BIN [id number] PCN [id number] [id number] ID [id number]  Pharmacy provided with approval and processing information. Confirmed $0 copay for Jardiance . Patient informed via voicemail.  Kennis Peacock, CPhT Rx Patient Advocate Phone: [phone]

## 2024-04-16 ENCOUNTER — Other Ambulatory Visit (HOSPITAL_COMMUNITY): Payer: Self-pay

## 2024-04-16 ENCOUNTER — Telehealth (HOSPITAL_COMMUNITY): Payer: Self-pay

## 2024-04-16 ENCOUNTER — Other Ambulatory Visit: Payer: Self-pay | Admitting: Nurse Practitioner

## 2024-04-16 DIAGNOSIS — E1165 Type 2 diabetes mellitus with hyperglycemia: Secondary | ICD-10-CM

## 2024-04-16 NOTE — Progress Notes (Signed)
 Paramedicine Encounter    Patient ID: FUTURE YELDELL, male    DOB: 27-Oct-1961, 63 y.o.   MRN: [id number]   Complaints- left hand pain   Assessment- CAOX4, warm and dry ambulatory without shortness of breath, no chest pain, no dizziness, no swelling, lungs clear, vitals stable.   Compliance with meds- missed one doses   Pill box filled- for one week   Refills needed-  Metformin  Jardiance  Gabapentin  Lasix  Metoprolol   Buspirone     Meds changes since last visit- none     Social changes- none    VISIT SUMMARY- Arrived for home visit for [name] who reports to be feeling good other than left hand ring finger pain. I sent a message to PCP about voltaren gel refill as he says it helps some with the pain. I obtained vitals and assessment as noted. I reviewed meds and filled pill box for one week. Refills called into [facility]..I also assisted him in contacting [facility] as they have not reached out about [facility] referral. I sent a message to [facility] as [facility] reports they have not received a referral yet. I also contacted his psyciatrist to set up an appointment for Tuesday 5/27 at 1:45 via Zoom. Home visit complete. I will see [name] in one week   BP (!) 140/78   Resp 16   Wt 277 lb (125.6 kg)   SpO2 97%   BMI 35.56 kg/m  Weight yesterday-- didn't weigh  Last visit weight-- 280lbs      ACTION: Home visit completed     Patient Care Team: Jerrlyn Morel, NP as PCP - General (Pulmonary Disease) Jacqueline Matsu, MD as PCP - Cardiology (Cardiology) Darlis Eisenmenger, MD as PCP - Advanced Heart Failure (Cardiology) Boyce Byes, MD as PCP - Electrophysiology (Cardiology) Denton Flakes, LCSW as Social Worker (Licensed Clinical Social Worker) Lindle Rhea, MD (Inactive) as Consulting Physician (Gastroenterology)  Patient Active Problem List   Diagnosis Date Noted   PVC's (premature ventricular contractions) 09/28/2023   Acute right-sided low back pain with  right-sided sciatica 05/14/2023   Multiple joint pain 12/22/2022   Obesity (BMI 35.0-39.9 without comorbidity) 07/18/2022   Cellulitis 06/22/2022   Cellulitis, leg 06/20/2022   Presence of Watchman left atrial appendage closure device 10/28/2021   Atrial fibrillation (HCC) 10/27/2021   Melena    Chronic diastolic CHF (congestive heart failure) (HCC)    GI bleed 06/29/2021   Severe anemia 06/29/2021   Adenomatous polyp of ascending colon    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   Acute blood loss anemia    Angiodysplasia of stomach    Chronic anticoagulation    CHF exacerbation (HCC) 06/15/2020   AKI (acute kidney injury) (HCC) 06/15/2020   CKD (chronic kidney disease), stage III (HCC) 06/15/2020   Anemia due to chronic blood loss    Gastric AVM    Angiodysplasia of duodenum with hemorrhage    Chronic systolic CHF (congestive heart failure) (HCC) 04/05/2020   Anemia 04/05/2020   PAF (paroxysmal atrial fibrillation) (HCC) 04/05/2020   OSA (obstructive sleep apnea) 04/05/2020   Syncope and collapse 04/05/2020   Type 2 diabetes mellitus with hyperglycemia, with long-term current use of insulin  (HCC) 12/03/2019   Diabetic polyneuropathy associated with type 2 diabetes mellitus (HCC) 12/03/2019   Erectile dysfunction 12/03/2019   Paranoid schizophrenia, chronic condition (HCC) 01/26/2015   Severe recurrent major depressive disorder with psychotic features (HCC) 01/26/2015   GAD (generalized anxiety disorder) 01/26/2015   OCD (  obsessive compulsive disorder) 01/26/2015   Panic disorder without agoraphobia 01/26/2015   Insomnia 01/26/2015    Current Outpatient Medications:    acetaminophen  (TYLENOL ) 500 MG tablet, Take 2 tablets (1,000 mg total) by mouth 2 (two) times daily as needed., Disp: 180 tablet, Rfl: 3   albuterol  (VENTOLIN  HFA) 108 (90 Base) MCG/ACT inhaler, Inhale 2 puffs into the lungs every 6 (six) hours as needed for wheezing or shortness of  breath., Disp: 18 g, Rfl: 1   atorvastatin  (LIPITOR) 40 MG tablet, Take 1 tablet by mouth once daily, Disp: 90 tablet, Rfl: 1   blood glucose meter kit and supplies KIT, Dispense based on patient and insurance preference. Use up to four times daily as directed. (FOR ICD-9 250.00, 250.01)., Disp: 1 each, Rfl: 0   busPIRone  (BUSPAR ) 10 MG tablet, Take 1 tablet (10 mg total) by mouth 2 (two) times daily., Disp: 60 tablet, Rfl: 2   colchicine  0.6 MG tablet, Daily for 7 days then daily PRN for gout flare, Disp: 30 tablet, Rfl: 0   Continuous Glucose Receiver (FREESTYLE LIBRE 3 READER) DEVI, 1 each by Does not apply route daily. Use to check glucose continuously, Disp: 1 each, Rfl: 1   Continuous Glucose Sensor (FREESTYLE LIBRE 3 SENSOR) MISC, Place 1 sensor on the skin every 14 days. Use to check glucose continuously, Disp: 6 each, Rfl: 2   diclofenac  Sodium (VOLTAREN) 1 % GEL, Apply 2 g topically daily as needed for pain., Disp: , Rfl:    empagliflozin  (JARDIANCE ) 10 MG TABS tablet, Take 1 tablet (10 mg total) by mouth daily., Disp: 90 tablet, Rfl: 3   ferrous sulfate  325 (65 FE) MG tablet, Take 1 tablet (325 mg total) by mouth every other day., Disp: 30 tablet, Rfl: 1   furosemide  (LASIX ) 40 MG tablet, TAKE 1 & 1/2 (ONE & ONE-HALF) TABLETS BY MOUTH TWICE DAILY, Disp: 90 tablet, Rfl: 0   gabapentin  (NEURONTIN ) 800 MG tablet, TAKE 1 TABLET BY MOUTH THREE TIMES DAILY, Disp: 90 tablet, Rfl: 0   HUMALOG  MIX 75/25 KWIKPEN (75-25) 100 UNIT/ML KwikPen, DIAL 30 UNITS AND INJECT UNDER THE SKIN TWICE DAILY. MAX DAILY DOSE: 60 UNITS., Disp: 60 mL, Rfl: 0   metFORMIN  (GLUCOPHAGE -XR) 500 MG 24 hr tablet, TAKE 1 TABLET BY MOUTH TWICE DAILY WITH A MEAL, Disp: 60 tablet, Rfl: 0   metoprolol  succinate (TOPROL -XL) 50 MG 24 hr tablet, Take 1.5 tablets (75 mg total) by mouth daily., Disp: 45 tablet, Rfl: 11   Multiple Vitamins-Minerals (CENTRUM SILVER 50+MEN) TABS, Take 1 tablet by mouth daily., Disp: , Rfl:    Omega-3  Fatty Acids (FISH OIL) 1000 MG CAPS, Take 1,000 mg by mouth at bedtime., Disp: , Rfl:    pantoprazole  (PROTONIX ) 40 MG tablet, Take 1 tablet by mouth once daily, Disp: 90 tablet, Rfl: 0   potassium chloride  SA (KLOR-CON  M) 20 MEQ tablet, Take 2 tablets (40 mEq total) by mouth daily., Disp: 60 tablet, Rfl: 6   QUEtiapine  (SEROQUEL ) 200 MG tablet, Take 200 mg by mouth at bedtime., Disp: , Rfl:    RELION PEN NEEDLES 31G X 6 MM MISC, INJECT 1 PEN INTO THE SKIN 2 TIMES DAILY, Disp: 100 each, Rfl: 0   sacubitril -valsartan  (ENTRESTO ) 24-26 MG, Take 1 tablet by mouth 2 (two) times daily., Disp: 180 tablet, Rfl: 1   Semaglutide, 2 MG/DOSE, (OZEMPIC, 2 MG/DOSE,) 8 MG/3ML SOPN, Inject 2 mg into the skin once a week. Monday, Disp: , Rfl:    sildenafil  (VIAGRA )  100 MG tablet, TAKE 1/2 TO 1 (ONE-HALF TO ONE) TABLET BY MOUTH ONCE DAILY AS NEEDED FOR ERECTILE DYSFUNCTION, Disp: 5 tablet, Rfl: 0   spironolactone  (ALDACTONE ) 25 MG tablet, Take 1 tablet (25 mg total) by mouth daily., Disp: 90 tablet, Rfl: 3   aspirin  EC 81 MG tablet, Take 81 mg by mouth daily. Swallow whole. (Patient not taking: Reported on 04/16/2024), Disp: , Rfl:    DULoxetine  (CYMBALTA ) 30 MG capsule, Take 1 capsule by mouth once daily (Patient not taking: Reported on 04/01/2024), Disp: 30 capsule, Rfl: 2   escitalopram  (LEXAPRO ) 10 MG tablet, Take 10 mg by mouth daily. Prescribed by Dr. Ardeth Beckers. Dimkpa (Patient not taking: Reported on 04/01/2024), Disp: , Rfl:    nicotine  (NICODERM CQ  - DOSED IN MG/24 HOURS) 14 mg/24hr patch, Place 1 patch (14 mg total) onto the skin daily. (Patient not taking: Reported on 04/01/2024), Disp: 28 patch, Rfl: 0   nicotine  (NICODERM CQ  - DOSED IN MG/24 HOURS) 21 mg/24hr patch, Place 1 patch (21 mg total) onto the skin daily. (Patient not taking: Reported on 04/01/2024), Disp: 28 patch, Rfl: 0   nicotine  (NICODERM CQ  - DOSED IN MG/24 HR) 7 mg/24hr patch, Place 1 patch (7 mg total) onto the skin daily. (Patient not taking: Reported on  04/01/2024), Disp: 28 patch, Rfl: 0   tadalafil  (CIALIS ) 20 MG tablet, Take 20 mg by mouth daily as needed. (Patient not taking: Reported on 04/01/2024), Disp: , Rfl:    Vitamin D , Ergocalciferol , (DRISDOL ) 1.25 MG (50000 UNIT) CAPS capsule, Take 1 capsule by mouth once a week (Patient not taking: Reported on 04/01/2024), Disp: 5 capsule, Rfl: 0   zinc gluconate 50 MG tablet, Take 50 mg by mouth daily. (Patient not taking: Reported on 04/01/2024), Disp: , Rfl:  No Known Allergies   Social History   Socioeconomic History   Marital status: Legally Separated    Spouse name: Not on file   Number of children: Not on file   Years of education: Not on file   Highest education level: Not on file  Occupational History   Not on file  Tobacco Use   Smoking status: Never    Passive exposure: Never   Smokeless tobacco: Never  Vaping Use   Vaping status: Never Used  Substance and Sexual Activity   Alcohol use: Not Currently    Alcohol/week: 2.0 standard drinks of alcohol    Types: 2 Cans of beer per week   Drug use: No   Sexual activity: Yes    Birth control/protection: None  Other Topics Concern   Not on file  Social History Narrative   Not on file   Social Drivers of Health   Financial Resource Strain: Medium Risk (05/24/2023)   Overall Financial Resource Strain (CARDIA)    Difficulty of Paying Living Expenses: Somewhat hard  Food Insecurity: No Food Insecurity (02/04/2024)   Hunger Vital Sign    Worried About Running Out of Food in the Last Year: Never true    Ran Out of Food in the Last Year: Never true  Transportation Needs: No Transportation Needs (05/24/2023)   PRAPARE - Administrator, Civil Service (Medical): No    Lack of Transportation (Non-Medical): No  Physical Activity: Sufficiently Active (05/24/2023)   Exercise Vital Sign    Days of Exercise per Week: 7 days    Minutes of Exercise per Session: 30 min  Stress: No Stress Concern Present (05/24/2023)   [employer] of Occupational Health -  Occupational Stress Questionnaire    Feeling of Stress : Not at all  Social Connections: Moderately Isolated (05/24/2023)   Social Connection and Isolation Panel [NHANES]    Frequency of Communication with Friends and Family: More than three times a week    Frequency of Social Gatherings with Friends and Family: More than three times a week    Attends Religious Services: Never    Database administrator or Organizations: No    Attends Banker Meetings: Never    Marital Status: Living with partner  Intimate Partner Violence: Not At Risk (05/24/2023)   Humiliation, Afraid, Rape, and Kick questionnaire    Fear of Current or Ex-Partner: No    Emotionally Abused: No    Physically Abused: No    Sexually Abused: No    Physical Exam      Future Appointments  Date Time Provider Department Center  05/12/2024  9:40 AM Jerrlyn Morel, NP [facility] None  05/29/2024 12:30 PM [facility]-ANNUAL WELLNESS VISIT [facility] None  06/05/2024  7:40 AM [facility] HVT DEVICE REMOTES [facility]  09/05/2024  7:40 AM [facility] HVT DEVICE REMOTES [facility]

## 2024-04-16 NOTE — Telephone Encounter (Signed)
 [name] reports having stiffness and pain with cramping- possible "trigger finger" in the left ring finger. He was using voltaren gel for same with mild relief of symptoms but is now out of same. Requesting refill for Voltaren Gel.   Roberts Ching, EMT-Paramedic [phone] 04/16/2024

## 2024-04-16 NOTE — Telephone Encounter (Signed)
 Contacted [facility] for Mr. Dauzat about his referral for the [facility] Device Consult- sent by Dr. Gaylyn Keas. [facility] reports they have no referral- [facility] says it was sent on 03/19/24. I will forward for them to follow up.   Roberts Ching, EMT-Paramedic [phone] 04/16/2024

## 2024-04-17 ENCOUNTER — Other Ambulatory Visit: Payer: Self-pay | Admitting: Nurse Practitioner

## 2024-04-17 MED ORDER — DICLOFENAC SODIUM 1 % EX GEL: 2.0000 g | Freq: Every day | CUTANEOUS | 2 refills | Status: DC | PRN

## 2024-04-18 ENCOUNTER — Telehealth: Payer: Self-pay | Admitting: Nurse Practitioner

## 2024-04-18 NOTE — Telephone Encounter (Signed)
 Patient was identified as falling into the [place] Measure - Diabetes.   Patient was: Appointment already scheduled for:  05/08/2024.

## 2024-04-18 NOTE — Addendum Note (Signed)
 Addended by: Lott Rouleau A on: 04/18/2024 02:20 PM   Modules accepted: Orders

## 2024-04-18 NOTE — Progress Notes (Signed)
 Remote ICD transmission.

## 2024-04-19 ENCOUNTER — Other Ambulatory Visit: Payer: Self-pay | Admitting: Nurse Practitioner

## 2024-04-22 ENCOUNTER — Other Ambulatory Visit (HOSPITAL_COMMUNITY): Payer: Self-pay

## 2024-04-22 ENCOUNTER — Other Ambulatory Visit: Payer: Self-pay | Admitting: Nurse Practitioner

## 2024-04-22 ENCOUNTER — Telehealth (HOSPITAL_COMMUNITY): Payer: Self-pay

## 2024-04-22 ENCOUNTER — Other Ambulatory Visit: Payer: Self-pay | Admitting: Physical Medicine & Rehabilitation

## 2024-04-22 DIAGNOSIS — E1165 Type 2 diabetes mellitus with hyperglycemia: Secondary | ICD-10-CM

## 2024-04-22 DIAGNOSIS — G6289 Other specified polyneuropathies: Secondary | ICD-10-CM

## 2024-04-22 NOTE — Telephone Encounter (Signed)
 [facility] advises they need a Prior Auth for [name] freestyle libre 3's.   Roberts Ching, EMT-Paramedic [phone] 04/22/2024

## 2024-04-22 NOTE — Progress Notes (Signed)
 Paramedicine Encounter    Patient ID: [name], male    DOB: 09-Sep-1961, 63 y.o.   MRN: [id number]   Med rec only- no complaints. Pill box filled for one week.  Refills called into [facility].   PA needed for [facility]- message sent to PCP.   Will follow up in one week.     ACTION: Home visit completed     Patient Care Team: Jerrlyn Morel, NP as PCP - General (Pulmonary Disease) Jacqueline Matsu, MD as PCP - Cardiology (Cardiology) Darlis Eisenmenger, MD as PCP - Advanced Heart Failure (Cardiology) Boyce Byes, MD as PCP - Electrophysiology (Cardiology) Denton Flakes, LCSW as Social Worker (Licensed Clinical Social Worker) Lindle Rhea, MD (Inactive) as Consulting Physician (Gastroenterology)  Patient Active Problem List   Diagnosis Date Noted   PVC's (premature ventricular contractions) 09/28/2023   Acute right-sided low back pain with right-sided sciatica 05/14/2023   Multiple joint pain 12/22/2022   Obesity (BMI 35.0-39.9 without comorbidity) 07/18/2022   Cellulitis 06/22/2022   Cellulitis, leg 06/20/2022   Presence of Watchman left atrial appendage closure device 10/28/2021   Atrial fibrillation (HCC) 10/27/2021   Melena    Chronic diastolic CHF (congestive heart failure) (HCC)    GI bleed 06/29/2021   Severe anemia 06/29/2021   Adenomatous polyp of ascending colon    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   Acute blood loss anemia    Angiodysplasia of stomach    Chronic anticoagulation    CHF exacerbation (HCC) 06/15/2020   AKI (acute kidney injury) (HCC) 06/15/2020   CKD (chronic kidney disease), stage III (HCC) 06/15/2020   Anemia due to chronic blood loss    Gastric AVM    Angiodysplasia of duodenum with hemorrhage    Chronic systolic CHF (congestive heart failure) (HCC) 04/05/2020   Anemia 04/05/2020   PAF (paroxysmal atrial fibrillation) (HCC) 04/05/2020   OSA (obstructive sleep apnea) 04/05/2020    Syncope and collapse 04/05/2020   Type 2 diabetes mellitus with hyperglycemia, with long-term current use of insulin  (HCC) 12/03/2019   Diabetic polyneuropathy associated with type 2 diabetes mellitus (HCC) 12/03/2019   Erectile dysfunction 12/03/2019   Paranoid schizophrenia, chronic condition (HCC) 01/26/2015   Severe recurrent major depressive disorder with psychotic features (HCC) 01/26/2015   GAD (generalized anxiety disorder) 01/26/2015   OCD (obsessive compulsive disorder) 01/26/2015   Panic disorder without agoraphobia 01/26/2015   Insomnia 01/26/2015    Current Outpatient Medications:    acetaminophen  (TYLENOL ) 500 MG tablet, Take 2 tablets (1,000 mg total) by mouth 2 (two) times daily as needed., Disp: 180 tablet, Rfl: 3   albuterol  (VENTOLIN  HFA) 108 (90 Base) MCG/ACT inhaler, Inhale 2 puffs into the lungs every 6 (six) hours as needed for wheezing or shortness of breath., Disp: 18 g, Rfl: 1   aspirin  EC 81 MG tablet, Take 81 mg by mouth daily. Swallow whole. (Patient not taking: Reported on 04/16/2024), Disp: , Rfl:    atorvastatin  (LIPITOR) 40 MG tablet, Take 1 tablet by mouth once daily, Disp: 90 tablet, Rfl: 1   blood glucose meter kit and supplies KIT, Dispense based on patient and insurance preference. Use up to four times daily as directed. (FOR ICD-9 250.00, 250.01)., Disp: 1 each, Rfl: 0   busPIRone  (BUSPAR ) 10 MG tablet, Take 1 tablet (10 mg total) by mouth 2 (two) times daily., Disp: 60 tablet, Rfl: 2   colchicine  0.6 MG tablet, Daily for 7 days then daily PRN for gout flare,  Disp: 30 tablet, Rfl: 0   Continuous Glucose Receiver (FREESTYLE LIBRE 3 READER) DEVI, 1 each by Does not apply route daily. Use to check glucose continuously, Disp: 1 each, Rfl: 1   Continuous Glucose Sensor (FREESTYLE LIBRE 3 SENSOR) MISC, PLACE 1 SENSOR ON THE SKIN EVERY 14 DAYS TO CHECK GLUCOSE CONTINUOUSLY, Disp: 6 each, Rfl: 0   diclofenac  Sodium (VOLTAREN ) 1 % GEL, Apply 2 g topically daily as  needed., Disp: 150 g, Rfl: 2   DULoxetine  (CYMBALTA ) 30 MG capsule, Take 1 capsule by mouth once daily (Patient not taking: Reported on 04/01/2024), Disp: 30 capsule, Rfl: 2   empagliflozin  (JARDIANCE ) 10 MG TABS tablet, Take 1 tablet (10 mg total) by mouth daily., Disp: 90 tablet, Rfl: 3   escitalopram  (LEXAPRO ) 10 MG tablet, Take 10 mg by mouth daily. Prescribed by Dr. Ardeth Beckers. Dimkpa (Patient not taking: Reported on 04/01/2024), Disp: , Rfl:    ferrous sulfate  325 (65 FE) MG tablet, Take 1 tablet (325 mg total) by mouth every other day., Disp: 30 tablet, Rfl: 1   furosemide  (LASIX ) 40 MG tablet, TAKE 1 & 1/2 (ONE & ONE-HALF) TABLETS BY MOUTH TWICE DAILY, Disp: 90 tablet, Rfl: 0   gabapentin  (NEURONTIN ) 800 MG tablet, TAKE 1 TABLET BY MOUTH THREE TIMES DAILY, Disp: 90 tablet, Rfl: 0   HUMALOG  MIX 75/25 KWIKPEN (75-25) 100 UNIT/ML KwikPen, DIAL 30 UNITS AND INJECT UNDER THE SKIN TWICE DAILY. MAX DAILY DOSE: 60 UNITS., Disp: 60 mL, Rfl: 0   metFORMIN  (GLUCOPHAGE -XR) 500 MG 24 hr tablet, TAKE 1 TABLET BY MOUTH TWICE DAILY WITH A MEAL, Disp: 60 tablet, Rfl: 0   metoprolol  succinate (TOPROL -XL) 50 MG 24 hr tablet, Take 1.5 tablets (75 mg total) by mouth daily., Disp: 45 tablet, Rfl: 11   Multiple Vitamins-Minerals (CENTRUM SILVER 50+MEN) TABS, Take 1 tablet by mouth daily., Disp: , Rfl:    nicotine  (NICODERM CQ  - DOSED IN MG/24 HOURS) 14 mg/24hr patch, Place 1 patch (14 mg total) onto the skin daily. (Patient not taking: Reported on 04/01/2024), Disp: 28 patch, Rfl: 0   nicotine  (NICODERM CQ  - DOSED IN MG/24 HOURS) 21 mg/24hr patch, Place 1 patch (21 mg total) onto the skin daily. (Patient not taking: Reported on 04/01/2024), Disp: 28 patch, Rfl: 0   nicotine  (NICODERM CQ  - DOSED IN MG/24 HR) 7 mg/24hr patch, Place 1 patch (7 mg total) onto the skin daily. (Patient not taking: Reported on 04/01/2024), Disp: 28 patch, Rfl: 0   Omega-3 Fatty Acids (FISH OIL) 1000 MG CAPS, Take 1,000 mg by mouth at bedtime., Disp: , Rfl:     pantoprazole  (PROTONIX ) 40 MG tablet, Take 1 tablet by mouth once daily, Disp: 90 tablet, Rfl: 0   potassium chloride  SA (KLOR-CON  M) 20 MEQ tablet, Take 2 tablets (40 mEq total) by mouth daily., Disp: 60 tablet, Rfl: 6   QUEtiapine  (SEROQUEL ) 200 MG tablet, Take 200 mg by mouth at bedtime., Disp: , Rfl:    RELION PEN NEEDLES 31G X 6 MM MISC, INJECT 1 PEN INTO THE SKIN 2 TIMES DAILY, Disp: 100 each, Rfl: 0   sacubitril -valsartan  (ENTRESTO ) 24-26 MG, Take 1 tablet by mouth 2 (two) times daily., Disp: 180 tablet, Rfl: 1   Semaglutide, 2 MG/DOSE, (OZEMPIC, 2 MG/DOSE,) 8 MG/3ML SOPN, Inject 2 mg into the skin once a week. Monday, Disp: , Rfl:    sildenafil  (VIAGRA ) 100 MG tablet, TAKE 1/2 TO 1 (ONE-HALF TO ONE) TABLET BY MOUTH ONCE DAILY AS NEEDED FOR ERECTILE DYSFUNCTION,  Disp: 5 tablet, Rfl: 0   spironolactone  (ALDACTONE ) 25 MG tablet, Take 1 tablet (25 mg total) by mouth daily., Disp: 90 tablet, Rfl: 3   tadalafil  (CIALIS ) 20 MG tablet, Take 20 mg by mouth daily as needed. (Patient not taking: Reported on 04/01/2024), Disp: , Rfl:    Vitamin D , Ergocalciferol , (DRISDOL ) 1.25 MG (50000 UNIT) CAPS capsule, Take 1 capsule by mouth once a week (Patient not taking: Reported on 04/01/2024), Disp: 5 capsule, Rfl: 0   zinc gluconate 50 MG tablet, Take 50 mg by mouth daily. (Patient not taking: Reported on 04/01/2024), Disp: , Rfl:  No Known Allergies   Social History   Socioeconomic History   Marital status: Legally Separated    Spouse name: Not on file   Number of children: Not on file   Years of education: Not on file   Highest education level: Not on file  Occupational History   Not on file  Tobacco Use   Smoking status: Never    Passive exposure: Never   Smokeless tobacco: Never  Vaping Use   Vaping status: Never Used  Substance and Sexual Activity   Alcohol use: Not Currently    Alcohol/week: 2.0 standard drinks of alcohol    Types: 2 Cans of beer per week   Drug use: No   Sexual activity:  Yes    Birth control/protection: None  Other Topics Concern   Not on file  Social History Narrative   Not on file   Social Drivers of Health   Financial Resource Strain: Medium Risk (05/24/2023)   Overall Financial Resource Strain (CARDIA)    Difficulty of Paying Living Expenses: Somewhat hard  Food Insecurity: No Food Insecurity (02/04/2024)   Hunger Vital Sign    Worried About Running Out of Food in the Last Year: Never true    Ran Out of Food in the Last Year: Never true  Transportation Needs: No Transportation Needs (05/24/2023)   PRAPARE - Administrator, Civil Service (Medical): No    Lack of Transportation (Non-Medical): No  Physical Activity: Sufficiently Active (05/24/2023)   Exercise Vital Sign    Days of Exercise per Week: 7 days    Minutes of Exercise per Session: 30 min  Stress: No Stress Concern Present (05/24/2023)   [employer] of Occupational Health - Occupational Stress Questionnaire    Feeling of Stress : Not at all  Social Connections: Moderately Isolated (05/24/2023)   Social Connection and Isolation Panel [NHANES]    Frequency of Communication with Friends and Family: More than three times a week    Frequency of Social Gatherings with Friends and Family: More than three times a week    Attends Religious Services: Never    Database administrator or Organizations: No    Attends Banker Meetings: Never    Marital Status: Living with partner  Intimate Partner Violence: Not At Risk (05/24/2023)   Humiliation, Afraid, Rape, and Kick questionnaire    Fear of Current or Ex-Partner: No    Emotionally Abused: No    Physically Abused: No    Sexually Abused: No    Physical Exam      Future Appointments  Date Time Provider Department Center  05/12/2024  9:40 AM Jerrlyn Morel, NP [facility] None  05/29/2024 12:30 PM [facility]-ANNUAL WELLNESS VISIT [facility] None  06/05/2024  7:40 AM [facility] HVT DEVICE REMOTES [facility]  09/05/2024  7:40 AM [facility]  HVT DEVICE REMOTES [facility]

## 2024-04-30 ENCOUNTER — Other Ambulatory Visit (HOSPITAL_COMMUNITY): Payer: Self-pay

## 2024-04-30 ENCOUNTER — Other Ambulatory Visit: Payer: Self-pay | Admitting: Nurse Practitioner

## 2024-04-30 DIAGNOSIS — F419 Anxiety disorder, unspecified: Secondary | ICD-10-CM

## 2024-04-30 NOTE — Progress Notes (Signed)
 Paramedicine Encounter    Patient ID: [name], male    DOB: 03-31-1961, 63 y.o.   MRN: [id number]   Complaints- none   Assessment- CAOX4, warm and dry reporting to be feeling well. He has been compliant with his meds for the last week. I obtained assessment and vitals. Lungs clear. CBG- 125 No edema.   Compliance with meds- no missed doses   Pill box filled- for one week   Refills needed- buspar  (too soon)  Meds changes since last visit- none     Social changes- none    VISIT SUMMARY- Arrived for home visit for [facility] who reports to be feeling well with no complaints. Lungs clear, no edema. Vitals within normal range. I reviewed meds and confirmed same filling pill box for one week. I reviewed HF education and the need to adhere to diet and meds. He agreed. I will plan to see [name] in one week in the home on Monday or Tuesday. Visit complete.   BP 110/64   Pulse 67   Resp 16   Wt 277 lb (125.6 kg)   SpO2 97%   BMI 35.56 kg/m  Weight yesterday-- didn't weigh  Last visit weight-- 277lbs     ACTION: Home visit completed     Patient Care Team: Jerrlyn Morel, NP as PCP - General (Pulmonary Disease) Jacqueline Matsu, MD as PCP - Cardiology (Cardiology) Darlis Eisenmenger, MD as PCP - Advanced Heart Failure (Cardiology) Boyce Byes, MD as PCP - Electrophysiology (Cardiology) Denton Flakes, LCSW as Social Worker (Licensed Clinical Social Worker) Lindle Rhea, MD (Inactive) as Consulting Physician (Gastroenterology)  Patient Active Problem List   Diagnosis Date Noted   PVC's (premature ventricular contractions) 09/28/2023   Acute right-sided low back pain with right-sided sciatica 05/14/2023   Multiple joint pain 12/22/2022   Obesity (BMI 35.0-39.9 without comorbidity) 07/18/2022   Cellulitis 06/22/2022   Cellulitis, leg 06/20/2022   Presence of Watchman left atrial appendage closure device 10/28/2021   Atrial fibrillation (HCC) 10/27/2021    Melena    Chronic diastolic CHF (congestive heart failure) (HCC)    GI bleed 06/29/2021   Severe anemia 06/29/2021   Adenomatous polyp of ascending colon    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   Acute blood loss anemia    Angiodysplasia of stomach    Chronic anticoagulation    CHF exacerbation (HCC) 06/15/2020   AKI (acute kidney injury) (HCC) 06/15/2020   CKD (chronic kidney disease), stage III (HCC) 06/15/2020   Anemia due to chronic blood loss    Gastric AVM    Angiodysplasia of duodenum with hemorrhage    Chronic systolic CHF (congestive heart failure) (HCC) 04/05/2020   Anemia 04/05/2020   PAF (paroxysmal atrial fibrillation) (HCC) 04/05/2020   OSA (obstructive sleep apnea) 04/05/2020   Syncope and collapse 04/05/2020   Type 2 diabetes mellitus with hyperglycemia, with long-term current use of insulin  (HCC) 12/03/2019   Diabetic polyneuropathy associated with type 2 diabetes mellitus (HCC) 12/03/2019   Erectile dysfunction 12/03/2019   Paranoid schizophrenia, chronic condition (HCC) 01/26/2015   Severe recurrent major depressive disorder with psychotic features (HCC) 01/26/2015   GAD (generalized anxiety disorder) 01/26/2015   OCD (obsessive compulsive disorder) 01/26/2015   Panic disorder without agoraphobia 01/26/2015   Insomnia 01/26/2015    Current Outpatient Medications:    acetaminophen  (TYLENOL ) 500 MG tablet, Take 2 tablets (1,000 mg total) by mouth 2 (two) times daily as needed., Disp: 180 tablet, Rfl: 3  albuterol  (VENTOLIN  HFA) 108 (90 Base) MCG/ACT inhaler, Inhale 2 puffs into the lungs every 6 (six) hours as needed for wheezing or shortness of breath., Disp: 18 g, Rfl: 1   aspirin  EC 81 MG tablet, Take 81 mg by mouth daily. Swallow whole., Disp: , Rfl:    atorvastatin  (LIPITOR) 40 MG tablet, Take 1 tablet by mouth once daily, Disp: 90 tablet, Rfl: 1   blood glucose meter kit and supplies KIT, Dispense based on patient and insurance  preference. Use up to four times daily as directed. (FOR ICD-9 250.00, 250.01)., Disp: 1 each, Rfl: 0   busPIRone  (BUSPAR ) 10 MG tablet, Take 1 tablet (10 mg total) by mouth 2 (two) times daily., Disp: 60 tablet, Rfl: 2   colchicine  0.6 MG tablet, Daily for 7 days then daily PRN for gout flare, Disp: 30 tablet, Rfl: 0   Continuous Glucose Receiver (FREESTYLE LIBRE 3 READER) DEVI, 1 each by Does not apply route daily. Use to check glucose continuously, Disp: 1 each, Rfl: 1   Continuous Glucose Sensor (FREESTYLE LIBRE 3 SENSOR) MISC, PLACE 1 SENSOR ON THE SKIN EVERY 14 DAYS TO CHECK GLUCOSE CONTINUOSLY, Disp: 6 each, Rfl: 0   diclofenac  Sodium (VOLTAREN ) 1 % GEL, Apply 2 g topically daily as needed., Disp: 150 g, Rfl: 2   DULoxetine  (CYMBALTA ) 30 MG capsule, Take 1 capsule by mouth once daily, Disp: 30 capsule, Rfl: 2   empagliflozin  (JARDIANCE ) 10 MG TABS tablet, Take 1 tablet (10 mg total) by mouth daily., Disp: 90 tablet, Rfl: 3   escitalopram  (LEXAPRO ) 10 MG tablet, Take 10 mg by mouth daily. Prescribed by Dr. Ardeth Beckers. Dimkpa, Disp: , Rfl:    ferrous sulfate  325 (65 FE) MG tablet, Take 1 tablet (325 mg total) by mouth every other day., Disp: 30 tablet, Rfl: 1   furosemide  (LASIX ) 40 MG tablet, TAKE 1 & 1/2 (ONE & ONE-HALF) TABLETS BY MOUTH TWICE DAILY, Disp: 90 tablet, Rfl: 0   gabapentin  (NEURONTIN ) 800 MG tablet, TAKE 1 TABLET BY MOUTH THREE TIMES DAILY, Disp: 90 tablet, Rfl: 0   HUMALOG  MIX 75/25 KWIKPEN (75-25) 100 UNIT/ML KwikPen, DIAL 30 UNITS AND INJECT UNDER THE SKIN TWICE DAILY. MAX DAILY DOSE: 60 UNITS., Disp: 60 mL, Rfl: 0   metFORMIN  (GLUCOPHAGE -XR) 500 MG 24 hr tablet, TAKE 1 TABLET BY MOUTH TWICE DAILY WITH A MEAL, Disp: 60 tablet, Rfl: 0   metoprolol  succinate (TOPROL -XL) 50 MG 24 hr tablet, Take 1.5 tablets (75 mg total) by mouth daily., Disp: 45 tablet, Rfl: 11   Multiple Vitamins-Minerals (CENTRUM SILVER 50+MEN) TABS, Take 1 tablet by mouth daily., Disp: , Rfl:    nicotine  (NICODERM CQ   - DOSED IN MG/24 HOURS) 14 mg/24hr patch, Place 1 patch (14 mg total) onto the skin daily., Disp: 28 patch, Rfl: 0   nicotine  (NICODERM CQ  - DOSED IN MG/24 HOURS) 21 mg/24hr patch, Place 1 patch (21 mg total) onto the skin daily., Disp: 28 patch, Rfl: 0   nicotine  (NICODERM CQ  - DOSED IN MG/24 HR) 7 mg/24hr patch, Place 1 patch (7 mg total) onto the skin daily., Disp: 28 patch, Rfl: 0   Omega-3 Fatty Acids (FISH OIL) 1000 MG CAPS, Take 1,000 mg by mouth at bedtime., Disp: , Rfl:    pantoprazole  (PROTONIX ) 40 MG tablet, Take 1 tablet by mouth once daily, Disp: 90 tablet, Rfl: 0   potassium chloride  SA (KLOR-CON  M) 20 MEQ tablet, Take 2 tablets (40 mEq total) by mouth daily., Disp: 60 tablet, Rfl:  6   QUEtiapine  (SEROQUEL ) 200 MG tablet, Take 200 mg by mouth at bedtime., Disp: , Rfl:    RELION PEN NEEDLES 31G X 6 MM MISC, INJECT 1 PEN INTO THE SKIN 2 TIMES DAILY, Disp: 100 each, Rfl: 0   sacubitril -valsartan  (ENTRESTO ) 24-26 MG, Take 1 tablet by mouth 2 (two) times daily., Disp: 180 tablet, Rfl: 1   Semaglutide, 2 MG/DOSE, (OZEMPIC, 2 MG/DOSE,) 8 MG/3ML SOPN, Inject 2 mg into the skin once a week. Monday, Disp: , Rfl:    sildenafil  (VIAGRA ) 100 MG tablet, TAKE 1/2 TO 1 (ONE-HALF TO ONE) TABLET BY MOUTH ONCE DAILY AS NEEDED FOR ERECTILE DYSFUNCTION, Disp: 5 tablet, Rfl: 0   spironolactone  (ALDACTONE ) 25 MG tablet, Take 1 tablet (25 mg total) by mouth daily., Disp: 90 tablet, Rfl: 3   tadalafil  (CIALIS ) 20 MG tablet, Take 20 mg by mouth daily as needed., Disp: , Rfl:    Vitamin D , Ergocalciferol , (DRISDOL ) 1.25 MG (50000 UNIT) CAPS capsule, Take 1 capsule by mouth once a week, Disp: 5 capsule, Rfl: 0   zinc gluconate 50 MG tablet, Take 50 mg by mouth daily., Disp: , Rfl:  No Known Allergies   Social History   Socioeconomic History   Marital status: Legally Separated    Spouse name: Not on file   Number of children: Not on file   Years of education: Not on file   Highest education level: Not on  file  Occupational History   Not on file  Tobacco Use   Smoking status: Never    Passive exposure: Never   Smokeless tobacco: Never  Vaping Use   Vaping status: Never Used  Substance and Sexual Activity   Alcohol use: Not Currently    Alcohol/week: 2.0 standard drinks of alcohol    Types: 2 Cans of beer per week   Drug use: No   Sexual activity: Yes    Birth control/protection: None  Other Topics Concern   Not on file  Social History Narrative   Not on file   Social Drivers of Health   Financial Resource Strain: Medium Risk (05/24/2023)   Overall Financial Resource Strain (CARDIA)    Difficulty of Paying Living Expenses: Somewhat hard  Food Insecurity: No Food Insecurity (02/04/2024)   Hunger Vital Sign    Worried About Running Out of Food in the Last Year: Never true    Ran Out of Food in the Last Year: Never true  Transportation Needs: No Transportation Needs (05/24/2023)   PRAPARE - Administrator, Civil Service (Medical): No    Lack of Transportation (Non-Medical): No  Physical Activity: Sufficiently Active (05/24/2023)   Exercise Vital Sign    Days of Exercise per Week: 7 days    Minutes of Exercise per Session: 30 min  Stress: No Stress Concern Present (05/24/2023)   [employer] of Occupational Health - Occupational Stress Questionnaire    Feeling of Stress : Not at all  Social Connections: Moderately Isolated (05/24/2023)   Social Connection and Isolation Panel [NHANES]    Frequency of Communication with Friends and Family: More than three times a week    Frequency of Social Gatherings with Friends and Family: More than three times a week    Attends Religious Services: Never    Database administrator or Organizations: No    Attends Banker Meetings: Never    Marital Status: Living with partner  Intimate Partner Violence: Not At Risk (05/24/2023)   Humiliation, Afraid,  Rape, and Kick questionnaire    Fear of Current or Ex-Partner: No     Emotionally Abused: No    Physically Abused: No    Sexually Abused: No    Physical Exam      Future Appointments  Date Time Provider Department Center  05/12/2024  9:40 AM Jerrlyn Morel, NP [facility] None  05/29/2024 12:30 PM [facility]-ANNUAL WELLNESS VISIT [facility] None  06/05/2024  7:40 AM [facility] HVT DEVICE REMOTES [facility]  09/05/2024  7:40 AM [facility] HVT DEVICE REMOTES [facility]

## 2024-05-01 NOTE — Telephone Encounter (Signed)
 Please advise [facility]

## 2024-05-05 ENCOUNTER — Other Ambulatory Visit (HOSPITAL_COMMUNITY): Payer: Self-pay

## 2024-05-05 NOTE — Progress Notes (Signed)
 Paramedicine Encounter    Patient ID: [name], male    DOB: 1961-06-23, 63 y.o.   MRN: [id number]  Compliance with meds- no missed doses   Pill box filled- for two weeks   Refills needed- buspirone , aspirin    Meds changes since last visit- none        VISIT SUMMARY- Med rec only, is going out of town on 6/23 will need CP to come by next week for a refill on his pill box to get him through to our next visit.    Roberts Ching, EMT-Paramedic [phone] 05/05/2024      ACTION: Home visit completed     Patient Care Team: Jerrlyn Morel, NP as PCP - General (Pulmonary Disease) Jacqueline Matsu, MD as PCP - Cardiology (Cardiology) Darlis Eisenmenger, MD as PCP - Advanced Heart Failure (Cardiology) Boyce Byes, MD as PCP - Electrophysiology (Cardiology) Denton Flakes, LCSW as Social Worker (Licensed Clinical Social Worker) Lindle Rhea, MD (Inactive) as Consulting Physician (Gastroenterology)  Patient Active Problem List   Diagnosis Date Noted   PVC's (premature ventricular contractions) 09/28/2023   Acute right-sided low back pain with right-sided sciatica 05/14/2023   Multiple joint pain 12/22/2022   Obesity (BMI 35.0-39.9 without comorbidity) 07/18/2022   Cellulitis 06/22/2022   Cellulitis, leg 06/20/2022   Presence of Watchman left atrial appendage closure device 10/28/2021   Atrial fibrillation (HCC) 10/27/2021   Melena    Chronic diastolic CHF (congestive heart failure) (HCC)    GI bleed 06/29/2021   Severe anemia 06/29/2021   Adenomatous polyp of ascending colon    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   Acute blood loss anemia    Angiodysplasia of stomach    Chronic anticoagulation    CHF exacerbation (HCC) 06/15/2020   AKI (acute kidney injury) (HCC) 06/15/2020   CKD (chronic kidney disease), stage III (HCC) 06/15/2020   Anemia due to chronic blood loss    Gastric AVM    Angiodysplasia of duodenum  with hemorrhage    Chronic systolic CHF (congestive heart failure) (HCC) 04/05/2020   Anemia 04/05/2020   PAF (paroxysmal atrial fibrillation) (HCC) 04/05/2020   OSA (obstructive sleep apnea) 04/05/2020   Syncope and collapse 04/05/2020   Type 2 diabetes mellitus with hyperglycemia, with long-term current use of insulin  (HCC) 12/03/2019   Diabetic polyneuropathy associated with type 2 diabetes mellitus (HCC) 12/03/2019   Erectile dysfunction 12/03/2019   Paranoid schizophrenia, chronic condition (HCC) 01/26/2015   Severe recurrent major depressive disorder with psychotic features (HCC) 01/26/2015   GAD (generalized anxiety disorder) 01/26/2015   OCD (obsessive compulsive disorder) 01/26/2015   Panic disorder without agoraphobia 01/26/2015   Insomnia 01/26/2015    Current Outpatient Medications:    acetaminophen  (TYLENOL ) 500 MG tablet, Take 2 tablets (1,000 mg total) by mouth 2 (two) times daily as needed., Disp: 180 tablet, Rfl: 3   albuterol  (VENTOLIN  HFA) 108 (90 Base) MCG/ACT inhaler, Inhale 2 puffs into the lungs every 6 (six) hours as needed for wheezing or shortness of breath., Disp: 18 g, Rfl: 1   aspirin  EC 81 MG tablet, Take 81 mg by mouth daily. Swallow whole., Disp: , Rfl:    atorvastatin  (LIPITOR) 40 MG tablet, Take 1 tablet by mouth once daily, Disp: 90 tablet, Rfl: 1   blood glucose meter kit and supplies KIT, Dispense based on patient and insurance preference. Use up to four times daily as directed. (FOR ICD-9 250.00, 250.01)., Disp: 1 each, Rfl: 0  busPIRone  (BUSPAR ) 10 MG tablet, Take 1 tablet by mouth twice daily, Disp: 60 tablet, Rfl: 0   colchicine  0.6 MG tablet, Daily for 7 days then daily PRN for gout flare, Disp: 30 tablet, Rfl: 0   Continuous Glucose Receiver (FREESTYLE LIBRE 3 READER) DEVI, 1 each by Does not apply route daily. Use to check glucose continuously, Disp: 1 each, Rfl: 1   Continuous Glucose Sensor (FREESTYLE LIBRE 3 SENSOR) MISC, PLACE 1 SENSOR ON THE  SKIN EVERY 14 DAYS TO CHECK GLUCOSE CONTINUOSLY, Disp: 6 each, Rfl: 0   diclofenac  Sodium (VOLTAREN ) 1 % GEL, Apply 2 g topically daily as needed., Disp: 150 g, Rfl: 2   DULoxetine  (CYMBALTA ) 30 MG capsule, Take 1 capsule by mouth once daily, Disp: 30 capsule, Rfl: 2   empagliflozin  (JARDIANCE ) 10 MG TABS tablet, Take 1 tablet (10 mg total) by mouth daily., Disp: 90 tablet, Rfl: 3   escitalopram  (LEXAPRO ) 10 MG tablet, Take 10 mg by mouth daily. Prescribed by Dr. Ardeth Beckers. Dimkpa, Disp: , Rfl:    ferrous sulfate  325 (65 FE) MG tablet, Take 1 tablet (325 mg total) by mouth every other day., Disp: 30 tablet, Rfl: 1   furosemide  (LASIX ) 40 MG tablet, TAKE 1 & 1/2 (ONE & ONE-HALF) TABLETS BY MOUTH TWICE DAILY, Disp: 90 tablet, Rfl: 0   gabapentin  (NEURONTIN ) 800 MG tablet, TAKE 1 TABLET BY MOUTH THREE TIMES DAILY, Disp: 90 tablet, Rfl: 0   HUMALOG  MIX 75/25 KWIKPEN (75-25) 100 UNIT/ML KwikPen, DIAL 30 UNITS AND INJECT UNDER THE SKIN TWICE DAILY. MAX DAILY DOSE: 60 UNITS., Disp: 60 mL, Rfl: 0   metFORMIN  (GLUCOPHAGE -XR) 500 MG 24 hr tablet, TAKE 1 TABLET BY MOUTH TWICE DAILY WITH A MEAL, Disp: 60 tablet, Rfl: 0   metoprolol  succinate (TOPROL -XL) 50 MG 24 hr tablet, Take 1.5 tablets (75 mg total) by mouth daily., Disp: 45 tablet, Rfl: 11   Multiple Vitamins-Minerals (CENTRUM SILVER 50+MEN) TABS, Take 1 tablet by mouth daily., Disp: , Rfl:    Omega-3 Fatty Acids (FISH OIL) 1000 MG CAPS, Take 1,000 mg by mouth at bedtime., Disp: , Rfl:    pantoprazole  (PROTONIX ) 40 MG tablet, Take 1 tablet by mouth once daily, Disp: 90 tablet, Rfl: 0   potassium chloride  SA (KLOR-CON  M) 20 MEQ tablet, Take 2 tablets (40 mEq total) by mouth daily., Disp: 60 tablet, Rfl: 6   QUEtiapine  (SEROQUEL ) 200 MG tablet, Take 200 mg by mouth at bedtime., Disp: , Rfl:    RELION PEN NEEDLES 31G X 6 MM MISC, INJECT 1 PEN INTO THE SKIN 2 TIMES DAILY, Disp: 100 each, Rfl: 0   sacubitril -valsartan  (ENTRESTO ) 24-26 MG, Take 1 tablet by mouth 2 (two)  times daily., Disp: 180 tablet, Rfl: 1   Semaglutide, 2 MG/DOSE, (OZEMPIC, 2 MG/DOSE,) 8 MG/3ML SOPN, Inject 2 mg into the skin once a week. Monday, Disp: , Rfl:    spironolactone  (ALDACTONE ) 25 MG tablet, Take 1 tablet (25 mg total) by mouth daily., Disp: 90 tablet, Rfl: 3   nicotine  (NICODERM CQ  - DOSED IN MG/24 HOURS) 14 mg/24hr patch, Place 1 patch (14 mg total) onto the skin daily. (Patient not taking: Reported on 05/05/2024), Disp: 28 patch, Rfl: 0   nicotine  (NICODERM CQ  - DOSED IN MG/24 HOURS) 21 mg/24hr patch, Place 1 patch (21 mg total) onto the skin daily. (Patient not taking: Reported on 05/05/2024), Disp: 28 patch, Rfl: 0   nicotine  (NICODERM CQ  - DOSED IN MG/24 HR) 7 mg/24hr patch, Place 1 patch (  7 mg total) onto the skin daily. (Patient not taking: Reported on 05/05/2024), Disp: 28 patch, Rfl: 0   sildenafil  (VIAGRA ) 100 MG tablet, TAKE 1/2 TO 1 (ONE-HALF TO ONE) TABLET BY MOUTH ONCE DAILY AS NEEDED FOR ERECTILE DYSFUNCTION (Patient not taking: Reported on 05/05/2024), Disp: 5 tablet, Rfl: 0   tadalafil  (CIALIS ) 20 MG tablet, Take 20 mg by mouth daily as needed. (Patient not taking: Reported on 05/05/2024), Disp: , Rfl:    Vitamin D , Ergocalciferol , (DRISDOL ) 1.25 MG (50000 UNIT) CAPS capsule, Take 1 capsule by mouth once a week (Patient not taking: Reported on 05/05/2024), Disp: 5 capsule, Rfl: 0   zinc gluconate 50 MG tablet, Take 50 mg by mouth daily. (Patient not taking: Reported on 05/05/2024), Disp: , Rfl:  No Known Allergies   Social History   Socioeconomic History   Marital status: Legally Separated    Spouse name: Not on file   Number of children: Not on file   Years of education: Not on file   Highest education level: Not on file  Occupational History   Not on file  Tobacco Use   Smoking status: Never    Passive exposure: Never   Smokeless tobacco: Never  Vaping Use   Vaping status: Never Used  Substance and Sexual Activity   Alcohol use: Not Currently    Alcohol/week: 2.0  standard drinks of alcohol    Types: 2 Cans of beer per week   Drug use: No   Sexual activity: Yes    Birth control/protection: None  Other Topics Concern   Not on file  Social History Narrative   Not on file   Social Drivers of Health   Financial Resource Strain: Medium Risk (05/24/2023)   Overall Financial Resource Strain (CARDIA)    Difficulty of Paying Living Expenses: Somewhat hard  Food Insecurity: No Food Insecurity (02/04/2024)   Hunger Vital Sign    Worried About Running Out of Food in the Last Year: Never true    Ran Out of Food in the Last Year: Never true  Transportation Needs: No Transportation Needs (05/24/2023)   PRAPARE - Administrator, Civil Service (Medical): No    Lack of Transportation (Non-Medical): No  Physical Activity: Sufficiently Active (05/24/2023)   Exercise Vital Sign    Days of Exercise per Week: 7 days    Minutes of Exercise per Session: 30 min  Stress: No Stress Concern Present (05/24/2023)   [employer] of Occupational Health - Occupational Stress Questionnaire    Feeling of Stress : Not at all  Social Connections: Moderately Isolated (05/24/2023)   Social Connection and Isolation Panel [NHANES]    Frequency of Communication with Friends and Family: More than three times a week    Frequency of Social Gatherings with Friends and Family: More than three times a week    Attends Religious Services: Never    Database administrator or Organizations: No    Attends Banker Meetings: Never    Marital Status: Living with partner  Intimate Partner Violence: Not At Risk (05/24/2023)   Humiliation, Afraid, Rape, and Kick questionnaire    Fear of Current or Ex-Partner: No    Emotionally Abused: No    Physically Abused: No    Sexually Abused: No    Physical Exam      Future Appointments  Date Time Provider Department Center  05/12/2024  9:40 AM Jerrlyn Morel, NP [facility] None  05/29/2024 12:30 PM [facility]NUAL WELLNESS VISIT  [facility] None  06/05/2024  7:40 AM [facility] HVT DEVICE REMOTES [facility]  09/05/2024  7:40 AM [facility] HVT DEVICE REMOTES [facility]

## 2024-05-12 ENCOUNTER — Ambulatory Visit: Payer: Self-pay | Admitting: Nurse Practitioner

## 2024-05-19 ENCOUNTER — Telehealth (HOSPITAL_COMMUNITY): Payer: Self-pay

## 2024-05-19 NOTE — Telephone Encounter (Signed)
 Left message for [name] to return my call for paramedicine visit. I will follow up.   [name], EMT-Paramedic [phone] 05/19/2024

## 2024-05-27 ENCOUNTER — Telehealth (HOSPITAL_COMMUNITY): Payer: Self-pay

## 2024-05-27 ENCOUNTER — Encounter: Admitting: Nurse Practitioner

## 2024-05-27 MED ORDER — METFORMIN HCL ER 500 MG PO TB24: 500.0000 mg | ORAL_TABLET | Freq: Two times a day (BID) | ORAL | 0 refills | Status: DC

## 2024-05-27 MED ORDER — PANTOPRAZOLE SODIUM 40 MG PO TBEC: 40.0000 mg | DELAYED_RELEASE_TABLET | Freq: Every day | ORAL | 0 refills | Status: DC

## 2024-05-27 MED ORDER — ATORVASTATIN CALCIUM 40 MG PO TABS: 40.0000 mg | ORAL_TABLET | Freq: Every day | ORAL | 1 refills | Status: DC

## 2024-05-27 MED ORDER — DICLOFENAC SODIUM 1 % EX GEL: 2.0000 g | Freq: Every day | CUTANEOUS | 2 refills | Status: AC | PRN

## 2024-05-27 MED ORDER — SPIRONOLACTONE 25 MG PO TABS: 25.0000 mg | ORAL_TABLET | Freq: Every day | ORAL | 3 refills | Status: DC

## 2024-05-27 MED ORDER — POTASSIUM CHLORIDE CRYS ER 20 MEQ PO TBCR: 40.0000 meq | EXTENDED_RELEASE_TABLET | Freq: Every day | ORAL | 6 refills | Status: DC

## 2024-05-27 MED ORDER — COLCHICINE 0.6 MG PO TABS: ORAL_TABLET | ORAL | 0 refills | Status: DC

## 2024-05-27 MED ORDER — ENTRESTO 24-26 MG PO TABS
1.0000 | ORAL_TABLET | Freq: Two times a day (BID) | ORAL | 1 refills | Status: DC
Start: 2024-05-27 — End: 2024-06-16

## 2024-05-27 MED ORDER — FUROSEMIDE 40 MG PO TABS: 40.0000 mg | ORAL_TABLET | Freq: Two times a day (BID) | ORAL | 0 refills | Status: DC

## 2024-05-27 MED ORDER — EMPAGLIFLOZIN 10 MG PO TABS
10.0000 mg | ORAL_TABLET | Freq: Every day | ORAL | 3 refills | Status: DC
Start: 2024-05-27 — End: 2024-06-16

## 2024-05-27 NOTE — Progress Notes (Signed)
 Subjective:   [name] is a 63 y.o. male who presents for Medicare Annual/Subsequent preventive examination.  Visit Complete: Virtual I connected with  [name] on 05/27/24 by a audio enabled telemedicine application and verified that I am speaking with the correct person using two identifiers.  Patient Location: Home  Provider Location: Office/Clinic  I discussed the limitations of evaluation and management by telemedicine. The patient expressed understanding and agreed to proceed.  Vital Signs: Because this visit was a virtual/telehealth visit, some criteria may be missing or patient reported. Any vitals not documented were not able to be obtained and vitals that have been documented are patient reported.  Patient Medicare AWV questionnaire was completed by the patient on 05/27/2024; I have confirmed that all information answered by patient is correct and no changes since this date.        Objective:    There were no vitals filed for this visit. There is no height or weight on file to calculate BMI.     05/24/2023   12:30 PM 06/20/2022    6:06 PM 05/11/2022    8:32 PM 12/12/2021   10:17 AM 10/27/2021   11:45 AM 07/01/2021    9:04 AM 05/18/2021    8:06 PM  Advanced Directives  Does Patient Have a Medical Advance Directive? No No No No No No No  Type of Theme park manager;Living will     Would patient like information on creating a medical advance directive? No - Patient declined No - Patient declined No - Patient declined Yes (MAU/Ambulatory/Procedural Areas - Information given)  No - Patient declined     Current Medications (verified) Outpatient Encounter Medications as of 05/29/2024  Medication Sig   acetaminophen  (TYLENOL ) 500 MG tablet Take 2 tablets (1,000 mg total) by mouth 2 (two) times daily as needed.   albuterol  (VENTOLIN  HFA) 108 (90 Base) MCG/ACT inhaler Inhale 2 puffs into the lungs every 6 (six) hours as needed  for wheezing or shortness of breath.   aspirin  EC 81 MG tablet Take 81 mg by mouth daily. Swallow whole.   atorvastatin  (LIPITOR) 40 MG tablet Take 1 tablet by mouth once daily   blood glucose meter kit and supplies KIT Dispense based on patient and insurance preference. Use up to four times daily as directed. (FOR ICD-9 250.00, 250.01).   busPIRone  (BUSPAR ) 10 MG tablet Take 1 tablet by mouth twice daily   colchicine  0.6 MG tablet Daily for 7 days then daily PRN for gout flare   Continuous Glucose Receiver (FREESTYLE LIBRE 3 READER) DEVI 1 each by Does not apply route daily. Use to check glucose continuously   Continuous Glucose Sensor (FREESTYLE LIBRE 3 SENSOR) MISC PLACE 1 SENSOR ON THE SKIN EVERY 14 DAYS TO CHECK GLUCOSE CONTINUOSLY   diclofenac  Sodium (VOLTAREN ) 1 % GEL Apply 2 g topically daily as needed.   DULoxetine  (CYMBALTA ) 30 MG capsule Take 1 capsule by mouth once daily   empagliflozin  (JARDIANCE ) 10 MG TABS tablet Take 1 tablet (10 mg total) by mouth daily.   escitalopram  (LEXAPRO ) 10 MG tablet Take 10 mg by mouth daily. Prescribed by Dr. FABIENE Khat   ferrous sulfate  325 (65 FE) MG tablet Take 1 tablet (325 mg total) by mouth every other day.   furosemide  (LASIX ) 40 MG tablet TAKE 1 & 1/2 (ONE & ONE-HALF) TABLETS BY MOUTH TWICE DAILY   gabapentin  (NEURONTIN ) 800 MG tablet TAKE 1 TABLET BY MOUTH THREE  TIMES DAILY   HUMALOG  MIX 75/25 KWIKPEN (75-25) 100 UNIT/ML KwikPen DIAL 30 UNITS AND INJECT UNDER THE SKIN TWICE DAILY. MAX DAILY DOSE: 60 UNITS.   metFORMIN  (GLUCOPHAGE -XR) 500 MG 24 hr tablet TAKE 1 TABLET BY MOUTH TWICE DAILY WITH A MEAL   metoprolol  succinate (TOPROL -XL) 50 MG 24 hr tablet Take 1.5 tablets (75 mg total) by mouth daily.   Multiple Vitamins-Minerals (CENTRUM SILVER 50+MEN) TABS Take 1 tablet by mouth daily.   nicotine  (NICODERM CQ  - DOSED IN MG/24 HOURS) 14 mg/24hr patch Place 1 patch (14 mg total) onto the skin daily. (Patient not taking: Reported on 05/05/2024)    nicotine  (NICODERM CQ  - DOSED IN MG/24 HOURS) 21 mg/24hr patch Place 1 patch (21 mg total) onto the skin daily. (Patient not taking: Reported on 05/05/2024)   nicotine  (NICODERM CQ  - DOSED IN MG/24 HR) 7 mg/24hr patch Place 1 patch (7 mg total) onto the skin daily. (Patient not taking: Reported on 05/05/2024)   Omega-3 Fatty Acids (FISH OIL) 1000 MG CAPS Take 1,000 mg by mouth at bedtime.   pantoprazole  (PROTONIX ) 40 MG tablet Take 1 tablet by mouth once daily   potassium chloride  SA (KLOR-CON  M) 20 MEQ tablet Take 2 tablets (40 mEq total) by mouth daily.   QUEtiapine  (SEROQUEL ) 200 MG tablet Take 200 mg by mouth at bedtime.   RELION PEN NEEDLES 31G X 6 MM MISC INJECT 1 PEN INTO THE SKIN 2 TIMES DAILY   sacubitril -valsartan  (ENTRESTO ) 24-26 MG Take 1 tablet by mouth 2 (two) times daily.   Semaglutide, 2 MG/DOSE, (OZEMPIC, 2 MG/DOSE,) 8 MG/3ML SOPN Inject 2 mg into the skin once a week. Monday   sildenafil  (VIAGRA ) 100 MG tablet TAKE 1/2 TO 1 (ONE-HALF TO ONE) TABLET BY MOUTH ONCE DAILY AS NEEDED FOR ERECTILE DYSFUNCTION (Patient not taking: Reported on 05/05/2024)   spironolactone  (ALDACTONE ) 25 MG tablet Take 1 tablet (25 mg total) by mouth daily.   tadalafil  (CIALIS ) 20 MG tablet Take 20 mg by mouth daily as needed. (Patient not taking: Reported on 05/05/2024)   Vitamin D , Ergocalciferol , (DRISDOL ) 1.25 MG (50000 UNIT) CAPS capsule Take 1 capsule by mouth once a week (Patient not taking: Reported on 05/05/2024)   zinc gluconate 50 MG tablet Take 50 mg by mouth daily. (Patient not taking: Reported on 05/05/2024)   No facility-administered encounter medications on file as of 05/29/2024.    Allergies (verified) Patient has no known allergies.   History: Past Medical History:  Diagnosis Date   AICD (automatic cardioverter/defibrillator) present    Anxiety    Atrial fibrillation (HCC)    CAD (coronary artery disease)    Cardiomyopathy (HCC)    CHF (congestive heart failure) (HCC) 09/2019   Depression     Diabetes mellitus    Erectile dysfunction 11/2019   GI bleeding    H/O right heart catheterization 09/2019   Hypertension    Presence of permanent cardiac pacemaker    Presence of Watchman left atrial appendage closure device 10/27/2021   27 mm Watchman Flex Device per Dr. Cindie   Schizophrenia [facility])    Sleep apnea    uses cpap   Past Surgical History:  Procedure Laterality Date   BIOPSY  04/19/2021   Procedure: BIOPSY;  Surgeon: Legrand Victory LITTIE DOUGLAS, MD;  Location: [facility] ENDOSCOPY;  Service: Gastroenterology;;   COLONOSCOPY WITH PROPOFOL  N/A 04/16/2021   Procedure: COLONOSCOPY WITH PROPOFOL ;  Surgeon: Eda Iha, MD;  Location: [facility] ENDOSCOPY;  Service: Gastroenterology;  Laterality: N/A;  CORONARY STENT PLACEMENT     ENTEROSCOPY N/A 04/08/2020   Procedure: ENTEROSCOPY;  Surgeon: Legrand Victory LITTIE DOUGLAS, MD;  Location: [name] ENDOSCOPY;  Service: Gastroenterology;  Laterality: N/A;   ENTEROSCOPY N/A 06/17/2020   Procedure: ENTEROSCOPY;  Surgeon: Abran Norleen SAILOR, MD;  Location: [facility] ENDOSCOPY;  Service: Endoscopy;  Laterality: N/A;   ENTEROSCOPY N/A 04/15/2021   Procedure: ENTEROSCOPY;  Surgeon: Eda Iha, MD;  Location: [facility] ENDOSCOPY;  Service: Gastroenterology;  Laterality: N/A;   ENTEROSCOPY N/A 04/19/2021   Procedure: ENTEROSCOPY;  Surgeon: Legrand Victory LITTIE DOUGLAS, MD;  Location: [facility] ENDOSCOPY;  Service: Gastroenterology;  Laterality: N/A;   ENTEROSCOPY N/A 07/01/2021   Procedure: ENTEROSCOPY;  Surgeon: Albertus Gordy HERO, MD;  Location: [facility] ENDOSCOPY;  Service: Gastroenterology;  Laterality: N/A;   GIVENS CAPSULE STUDY  04/16/2021   Procedure: GIVENS CAPSULE STUDY;  Surgeon: Eda Iha, MD;  Location: [facility] ENDOSCOPY;  Service: Gastroenterology;;   HEMOSTASIS CLIP PLACEMENT  04/08/2020   Procedure: HEMOSTASIS CLIP PLACEMENT;  Surgeon: Legrand Victory LITTIE DOUGLAS, MD;  Location: [facility] ENDOSCOPY;  Service: Gastroenterology;;   HEMOSTASIS CLIP PLACEMENT  07/01/2021   Procedure: HEMOSTASIS CLIP PLACEMENT;   Surgeon: Albertus Gordy HERO, MD;  Location: [facility] ENDOSCOPY;  Service: Gastroenterology;;   HEMOSTASIS CONTROL  04/08/2020   Procedure: HEMOSTASIS CONTROL;  Surgeon: Legrand Victory LITTIE DOUGLAS, MD;  Location: [facility] ENDOSCOPY;  Service: Gastroenterology;;   HEMOSTASIS CONTROL  06/17/2020   Procedure: HEMOSTASIS CONTROL;  Surgeon: Abran Norleen SAILOR, MD;  Location: [facility] ENDOSCOPY;  Service: Endoscopy;;   HERNIA REPAIR     HOT HEMOSTASIS N/A 04/15/2021   Procedure: HOT HEMOSTASIS (ARGON PLASMA COAGULATION/BICAP);  Surgeon: Eda Iha, MD;  Location: [facility] ENDOSCOPY;  Service: Gastroenterology;  Laterality: N/A;   HOT HEMOSTASIS N/A 07/01/2021   Procedure: HOT HEMOSTASIS (ARGON PLASMA COAGULATION/BICAP);  Surgeon: Albertus Gordy HERO, MD;  Location: [facility] ENDOSCOPY;  Service: Gastroenterology;  Laterality: N/A;   ICD IMPLANT N/A 09/03/2020   Procedure: ICD IMPLANT;  Surgeon: Cindie Ole DASEN, MD;  Location: [facility] INVASIVE CV LAB;  Service: Cardiovascular;  Laterality: N/A;   LEFT ATRIAL APPENDAGE OCCLUSION N/A 10/27/2021   Procedure: LEFT ATRIAL APPENDAGE OCCLUSION;  Surgeon: Cindie Ole DASEN, MD;  Location: [facility] INVASIVE CV LAB;  Service: Cardiovascular;  Laterality: N/A;   POLYPECTOMY  04/16/2021   Procedure: POLYPECTOMY;  Surgeon: Eda Iha, MD;  Location: [facility] ENDOSCOPY;  Service: Gastroenterology;;   RIGHT/LEFT HEART CATH AND CORONARY ANGIOGRAPHY N/A 10/14/2019   Procedure: RIGHT/LEFT HEART CATH AND CORONARY ANGIOGRAPHY;  Surgeon: Claudene Victory ORN, MD;  Location: [facility] INVASIVE CV LAB;  Service: Cardiovascular;  Laterality: N/A;   SUBMUCOSAL TATTOO INJECTION  04/19/2021   Procedure: SUBMUCOSAL TATTOO INJECTION;  Surgeon: Legrand Victory LITTIE DOUGLAS, MD;  Location: [facility] ENDOSCOPY;  Service: Gastroenterology;;   TEE WITHOUT CARDIOVERSION N/A 10/27/2021   Procedure: TRANSESOPHAGEAL ECHOCARDIOGRAM (TEE);  Surgeon: Cindie Ole DASEN, MD;  Location: [facility] INVASIVE CV LAB;  Service: Cardiovascular;  Laterality: N/A;   TEE WITHOUT CARDIOVERSION N/A  12/12/2021   Procedure: TRANSESOPHAGEAL ECHOCARDIOGRAM (TEE);  Surgeon: Barbaraann Darryle Ned, MD;  Location: [facility] ENDOSCOPY;  Service: Cardiovascular;  Laterality: N/A;   Family History  Problem Relation Age of Onset   Heart failure Mother    Mental illness Sister    Mental illness Sister    Social History   Socioeconomic History   Marital status: Legally Separated    Spouse name: Not on file   Number of children: Not on file   Years of education: Not on file  Highest education level: Not on file  Occupational History   Not on file  Tobacco Use   Smoking status: Never    Passive exposure: Never   Smokeless tobacco: Never  Vaping Use   Vaping status: Never Used  Substance and Sexual Activity   Alcohol use: Not Currently    Alcohol/week: 2.0 standard drinks of alcohol    Types: 2 Cans of beer per week   Drug use: No   Sexual activity: Yes    Birth control/protection: None  Other Topics Concern   Not on file  Social History Narrative   Not on file   Social Drivers of Health   Financial Resource Strain: Medium Risk (05/24/2023)   Overall Financial Resource Strain (CARDIA)    Difficulty of Paying Living Expenses: Somewhat hard  Food Insecurity: No Food Insecurity (02/04/2024)   Hunger Vital Sign    Worried About Running Out of Food in the Last Year: Never true    Ran Out of Food in the Last Year: Never true  Transportation Needs: No Transportation Needs (05/24/2023)   PRAPARE - Administrator, Civil Service (Medical): No    Lack of Transportation (Non-Medical): No  Physical Activity: Sufficiently Active (05/24/2023)   Exercise Vital Sign    Days of Exercise per Week: 7 days    Minutes of Exercise per Session: 30 min  Stress: No Stress Concern Present (05/24/2023)   [employer] of Occupational Health - Occupational Stress Questionnaire    Feeling of Stress : Not at all  Social Connections: Moderately Isolated (05/24/2023)   Social Connection and Isolation  Panel    Frequency of Communication with Friends and Family: More than three times a week    Frequency of Social Gatherings with Friends and Family: More than three times a week    Attends Religious Services: Never    Database administrator or Organizations: No    Attends Banker Meetings: Never    Marital Status: Living with partner    Tobacco Counseling Counseling given: Not Answered   Clinical Intake:                        Activities of Daily Living     No data to display          Patient Care Team: Oley Bascom RAMAN, NP as PCP - General (Pulmonary Disease) Shlomo Wilbert SAUNDERS, MD as PCP - Cardiology (Cardiology) [name] Ezra RAMAN, MD as PCP - Advanced Heart Failure (Cardiology) Cindie Ole DASEN, MD as PCP - Electrophysiology (Cardiology) Cathern Andriette DEL, LCSW as Social Worker (Licensed Clinical Social Worker) Eda Iha, MD (Inactive) as Consulting Physician (Gastroenterology)  Indicate any recent Medical Services you may have received from other than [facility] providers in the past year (date may be approximate).     Assessment:   This is a routine wellness examination for [place].  Hearing/Vision screen No results found.   Goals Addressed   None    Depression Screen    02/04/2024    9:55 AM 05/24/2023   12:27 PM 05/14/2023   11:19 AM 02/26/2023    2:43 PM 02/05/2023   11:16 AM 01/19/2023    1:29 PM 02/02/2022   11:28 AM  PHQ 2/9 Scores  PHQ - 2 Score 3 0 0 0 0 2 0  PHQ- 9 Score 8    2 12      Fall Risk    05/24/2023   12:30 PM  02/26/2023    2:43 PM 01/19/2023    1:29 PM 02/02/2022   11:27 AM 11/04/2021   11:12 AM  Fall Risk   Falls in the past year? 0 1 1 0 0  Number falls in past yr: 0 1 1 0 0  Injury with Fall? 0 0 0 0 0  Risk for fall due to : No Fall Risks   No Fall Risks   Follow up Falls prevention discussed   Falls evaluation completed       Data saved with a previous flowsheet row definition    MEDICARE RISK AT  HOME:     Cognitive Function:        05/24/2023   12:30 PM  6CIT Screen  What Year? 0 points  What month? 0 points  What time? 0 points  Count back from 20 0 points  Months in reverse 0 points  Repeat phrase 0 points  Total Score 0 points    Immunizations Immunization History  Administered Date(s) Administered   Influenza,inj,Quad PF,6+ Mos 10/13/2019, 09/13/2020, 08/03/2021, 11/01/2022   PFIZER(Purple Top)SARS-COV-2 Vaccination 03/13/2020   Pneumococcal Polysaccharide-23 10/16/2019   Tdap 09/13/2020   Zoster Recombinant(Shingrix) 08/03/2021    TDAP status: Up to date  Flu Vaccine status: Up to date  Pneumococcal vaccine status: Up to date  Covid-19 vaccine status: Information provided on how to obtain vaccines.   Qualifies for Shingles Vaccine? Yes   Zostavax completed Yes   Shingrix Completed?: Yes  Screening Tests Health Maintenance  Topic Date Due   OPHTHALMOLOGY EXAM  Never done   Pneumococcal Vaccine 64-58 Years old (2 of 2 - PCV) 10/15/2020   Zoster Vaccines- Shingrix (2 of 2) 09/28/2021   FOOT EXAM  04/01/2022   COVID-19 Vaccine (2 - 2024-25 season) 07/29/2023   Colonoscopy  04/16/2024   Medicare Annual Wellness (AWV)  05/23/2024   INFLUENZA VACCINE  06/27/2024   HEMOGLOBIN A1C  08/06/2024   Diabetic kidney evaluation - eGFR measurement  02/03/2025   Diabetic kidney evaluation - Urine ACR  02/03/2025   DTaP/Tdap/Td (2 - Td or Tdap) 09/13/2030   Hepatitis C Screening  Completed   HIV Screening  Completed   Hepatitis B Vaccines  Aged Out   HPV VACCINES  Aged Out   Meningococcal B Vaccine  Aged Out    Health Maintenance  Health Maintenance Due  Topic Date Due   OPHTHALMOLOGY EXAM  Never done   Pneumococcal Vaccine 57-34 Years old (2 of 2 - PCV) 10/15/2020   Zoster Vaccines- Shingrix (2 of 2) 09/28/2021   FOOT EXAM  04/01/2022   COVID-19 Vaccine (2 - 2024-25 season) 07/29/2023   Colonoscopy  04/16/2024   Medicare Annual Wellness (AWV)   05/23/2024     Lung Cancer Screening: (Low Dose CT Chest recommended if Age 31-80 years, 20 pack-year currently smoking OR have quit w/in 15years.) does qualify.   Lung Cancer Screening Referral: NA  Additional Screening:  Hepatitis C Screening: does qualify; Completed 11/08/2021  Vision Screening: Recommended annual ophthalmology exams for early detection of glaucoma and other disorders of the eye. Is the patient up to date with their annual eye exam?  No  Who is the provider or what is the name of the office in which the patient attends annual eye exams? NA If pt is not established with a provider, would they like to be referred to a provider to establish care? Yes .   Dental Screening: Recommended annual dental exams for proper oral hygiene  Diabetic Foot Exam: Diabetic Foot Exam: Overdue, Pt has been advised about the importance in completing this exam. Pt is scheduled for diabetic foot exam on Due.  Community Resource Referral / Chronic Care Management: CRR required this visit?  No   CCM required this visit?  Appt scheduled with PCP     Plan:     I have personally reviewed and noted the following in the patient's chart:   Medical and social history Use of alcohol, tobacco or illicit drugs  Current medications and supplements including opioid prescriptions. Patient is not currently taking opioid prescriptions. Functional ability and status Nutritional status Physical activity Advanced directives List of other physicians Hospitalizations, surgeries, and ER visits in previous 12 months Vitals Screenings to include cognitive, depression, and falls Referrals and appointments  In addition, I have reviewed and discussed with patient certain preventive protocols, quality metrics, and best practice recommendations. A written personalized care plan for preventive services as well as general preventive health recommendations were provided to patient.     Zahriah Roes D Zakaiya Lares,  CMA   05/27/2024   After Visit Summary: (Mail) Due to this being a telephonic visit, the after visit summary with patients personalized plan was offered to patient via mail   Nurse Notes: Thanks for allowing me the time to speak with you, have a great week

## 2024-05-27 NOTE — Telephone Encounter (Signed)
 Left message for [name] to return my call to set up home paramedicine visit. No call backs since attempts have been made last week and this week.   [name], EMT-Paramedic [phone] 05/27/2024

## 2024-05-28 ENCOUNTER — Other Ambulatory Visit (HOSPITAL_COMMUNITY): Payer: Self-pay

## 2024-05-28 NOTE — Progress Notes (Signed)
 Paramedicine Encounter    Patient ID: [name], male    DOB: 07-14-1961, 63 y.o.   MRN: [id number]   Complaints- headache been off medications for over 5 days   Assessment- CAOx4,cool and clammy in his kitchen reporting to be having a headache and has been off his meds for 5 days. BP high, lungs clear, no edema, no dizziness, no chest pain, no swelling but has weight gain.  Compliance with meds- missed 5 days of meds   Pill box filled- for one week   Refills needed- gabapentin , potassium, metoprolol    Meds changes since last visit- none    Social changes- none    VISIT SUMMARY- Arrived for home visit for [name] who reports to be having a headache,  has been off meds for 5 days, no swelling, has had some weight gain since our last visit. No lower leg edema. Lungs clear. I obtained vitals as noted. BP elevated- gave him his morning meds during visit to get him back on track. I reviewed meds and filled pill box- I think I will get him on bubble packs to ensure he has his meds because he has been very inconsistent with our visits. He agreed to this. Appointments reviewed. Home visit complete. I will see him in one week.   BP (!) 200/90   Pulse 90   Resp 18   Wt 293 lb (132.9 kg)   SpO2 99%   BMI 36.62 kg/m  Weight yesterday-- didn't weigh  Last visit weight-- 277lbs      ACTION: Home visit completed     Patient Care Team: Oley Bascom RAMAN, NP as PCP - General (Pulmonary Disease) Shlomo Wilbert SAUNDERS, MD as PCP - Cardiology (Cardiology) [name] Ezra RAMAN, MD as PCP - Advanced Heart Failure (Cardiology) Cindie Ole DASEN, MD as PCP - Electrophysiology (Cardiology) Cathern Andriette DEL, LCSW as Social Worker (Licensed Clinical Social Worker) Eda Iha, MD (Inactive) as Consulting Physician (Gastroenterology)  Patient Active Problem List   Diagnosis Date Noted   PVC's (premature ventricular contractions) 09/28/2023   Acute right-sided low back pain with right-sided  sciatica 05/14/2023   Multiple joint pain 12/22/2022   Obesity (BMI 35.0-39.9 without comorbidity) 07/18/2022   Cellulitis 06/22/2022   Cellulitis, leg 06/20/2022   Presence of Watchman left atrial appendage closure device 10/28/2021   Atrial fibrillation (HCC) 10/27/2021   Melena    Chronic diastolic CHF (congestive heart failure) (HCC)    GI bleed 06/29/2021   Severe anemia 06/29/2021   Adenomatous polyp of ascending colon    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   Acute blood loss anemia    Angiodysplasia of stomach    Chronic anticoagulation    CHF exacerbation (HCC) 06/15/2020   AKI (acute kidney injury) (HCC) 06/15/2020   CKD (chronic kidney disease), stage III (HCC) 06/15/2020   Anemia due to chronic blood loss    Gastric AVM    Angiodysplasia of duodenum with hemorrhage    Chronic systolic CHF (congestive heart failure) (HCC) 04/05/2020   Anemia 04/05/2020   PAF (paroxysmal atrial fibrillation) (HCC) 04/05/2020   OSA (obstructive sleep apnea) 04/05/2020   Syncope and collapse 04/05/2020   Type 2 diabetes mellitus with hyperglycemia, with long-term current use of insulin  (HCC) 12/03/2019   Diabetic polyneuropathy associated with type 2 diabetes mellitus (HCC) 12/03/2019   Erectile dysfunction 12/03/2019   Paranoid schizophrenia, chronic condition (HCC) 01/26/2015   Severe recurrent major depressive disorder with psychotic features (HCC) 01/26/2015   GAD (generalized  anxiety disorder) 01/26/2015   OCD (obsessive compulsive disorder) 01/26/2015   Panic disorder without agoraphobia 01/26/2015   Insomnia 01/26/2015    Current Outpatient Medications:    acetaminophen  (TYLENOL ) 500 MG tablet, Take 2 tablets (1,000 mg total) by mouth 2 (two) times daily as needed., Disp: 180 tablet, Rfl: 3   aspirin  EC 81 MG tablet, Take 81 mg by mouth daily. Swallow whole., Disp: , Rfl:    atorvastatin  (LIPITOR) 40 MG tablet, Take 1 tablet (40 mg total) by mouth  daily., Disp: 90 tablet, Rfl: 1   blood glucose meter kit and supplies KIT, Dispense based on patient and insurance preference. Use up to four times daily as directed. (FOR ICD-9 250.00, 250.01)., Disp: 1 each, Rfl: 0   busPIRone  (BUSPAR ) 10 MG tablet, Take 1 tablet by mouth twice daily, Disp: 60 tablet, Rfl: 0   colchicine  0.6 MG tablet, Daily for 7 days then daily PRN for gout flare, Disp: 30 tablet, Rfl: 0   Continuous Glucose Receiver (FREESTYLE LIBRE 3 READER) DEVI, 1 each by Does not apply route daily. Use to check glucose continuously, Disp: 1 each, Rfl: 1   Continuous Glucose Sensor (FREESTYLE LIBRE 3 SENSOR) MISC, PLACE 1 SENSOR ON THE SKIN EVERY 14 DAYS TO CHECK GLUCOSE CONTINUOSLY, Disp: 6 each, Rfl: 0   diclofenac  Sodium (VOLTAREN ) 1 % GEL, Apply 2 g topically daily as needed., Disp: 150 g, Rfl: 2   empagliflozin  (JARDIANCE ) 10 MG TABS tablet, Take 1 tablet (10 mg total) by mouth daily., Disp: 90 tablet, Rfl: 3   ferrous sulfate  325 (65 FE) MG tablet, Take 1 tablet (325 mg total) by mouth every other day., Disp: 30 tablet, Rfl: 1   furosemide  (LASIX ) 40 MG tablet, Take 1 tablet (40 mg total) by mouth 2 (two) times daily. (Patient taking differently: Take 60 mg by mouth 2 (two) times daily.), Disp: 90 tablet, Rfl: 0   gabapentin  (NEURONTIN ) 800 MG tablet, TAKE 1 TABLET BY MOUTH THREE TIMES DAILY, Disp: 90 tablet, Rfl: 0   HUMALOG  MIX 75/25 KWIKPEN (75-25) 100 UNIT/ML KwikPen, DIAL 30 UNITS AND INJECT UNDER THE SKIN TWICE DAILY. MAX DAILY DOSE: 60 UNITS., Disp: 60 mL, Rfl: 0   metFORMIN  (GLUCOPHAGE -XR) 500 MG 24 hr tablet, Take 1 tablet (500 mg total) by mouth 2 (two) times daily with a meal., Disp: 60 tablet, Rfl: 0   metoprolol  succinate (TOPROL -XL) 50 MG 24 hr tablet, Take 1.5 tablets (75 mg total) by mouth daily., Disp: 45 tablet, Rfl: 11   Multiple Vitamins-Minerals (CENTRUM SILVER 50+MEN) TABS, Take 1 tablet by mouth daily., Disp: , Rfl:    Omega-3 Fatty Acids (FISH OIL) 1000 MG CAPS,  Take 1,000 mg by mouth at bedtime., Disp: , Rfl:    pantoprazole  (PROTONIX ) 40 MG tablet, Take 1 tablet (40 mg total) by mouth daily., Disp: 90 tablet, Rfl: 0   potassium chloride  SA (KLOR-CON  M) 20 MEQ tablet, Take 2 tablets (40 mEq total) by mouth daily., Disp: 60 tablet, Rfl: 6   QUEtiapine  (SEROQUEL ) 200 MG tablet, Take 200 mg by mouth at bedtime., Disp: , Rfl:    RELION PEN NEEDLES 31G X 6 MM MISC, INJECT 1 PEN INTO THE SKIN 2 TIMES DAILY, Disp: 100 each, Rfl: 0   sacubitril -valsartan  (ENTRESTO ) 24-26 MG, Take 1 tablet by mouth 2 (two) times daily., Disp: 180 tablet, Rfl: 1   Semaglutide, 2 MG/DOSE, (OZEMPIC, 2 MG/DOSE,) 8 MG/3ML SOPN, Inject 2 mg into the skin once a week. Monday, Disp: ,  Rfl:    spironolactone  (ALDACTONE ) 25 MG tablet, Take 1 tablet (25 mg total) by mouth daily., Disp: 90 tablet, Rfl: 3   albuterol  (VENTOLIN  HFA) 108 (90 Base) MCG/ACT inhaler, Inhale 2 puffs into the lungs every 6 (six) hours as needed for wheezing or shortness of breath. (Patient not taking: Reported on 05/28/2024), Disp: 18 g, Rfl: 1   DULoxetine  (CYMBALTA ) 30 MG capsule, Take 1 capsule by mouth once daily (Patient not taking: Reported on 05/28/2024), Disp: 30 capsule, Rfl: 2   escitalopram  (LEXAPRO ) 10 MG tablet, Take 10 mg by mouth daily. Prescribed by Dr. JONELLE. Dimkpa (Patient not taking: Reported on 05/28/2024), Disp: , Rfl:    nicotine  (NICODERM CQ  - DOSED IN MG/24 HOURS) 14 mg/24hr patch, Place 1 patch (14 mg total) onto the skin daily. (Patient not taking: Reported on 05/28/2024), Disp: 28 patch, Rfl: 0   nicotine  (NICODERM CQ  - DOSED IN MG/24 HOURS) 21 mg/24hr patch, Place 1 patch (21 mg total) onto the skin daily. (Patient not taking: Reported on 05/28/2024), Disp: 28 patch, Rfl: 0   nicotine  (NICODERM CQ  - DOSED IN MG/24 HR) 7 mg/24hr patch, Place 1 patch (7 mg total) onto the skin daily. (Patient not taking: Reported on 05/28/2024), Disp: 28 patch, Rfl: 0   sildenafil  (VIAGRA ) 100 MG tablet, TAKE 1/2 TO 1  (ONE-HALF TO ONE) TABLET BY MOUTH ONCE DAILY AS NEEDED FOR ERECTILE DYSFUNCTION (Patient not taking: Reported on 05/28/2024), Disp: 5 tablet, Rfl: 0   tadalafil  (CIALIS ) 20 MG tablet, Take 20 mg by mouth daily as needed. (Patient not taking: Reported on 05/05/2024), Disp: , Rfl:    Vitamin D , Ergocalciferol , (DRISDOL ) 1.25 MG (50000 UNIT) CAPS capsule, Take 1 capsule by mouth once a week (Patient not taking: Reported on 05/28/2024), Disp: 5 capsule, Rfl: 0   zinc gluconate 50 MG tablet, Take 50 mg by mouth daily. (Patient not taking: Reported on 05/05/2024), Disp: , Rfl:  No Known Allergies   Social History   Socioeconomic History   Marital status: Legally Separated    Spouse name: Not on file   Number of children: Not on file   Years of education: Not on file   Highest education level: Not on file  Occupational History   Not on file  Tobacco Use   Smoking status: Never    Passive exposure: Never   Smokeless tobacco: Never  Vaping Use   Vaping status: Never Used  Substance and Sexual Activity   Alcohol use: Not Currently    Alcohol/week: 2.0 standard drinks of alcohol    Types: 2 Cans of beer per week   Drug use: No   Sexual activity: Yes    Birth control/protection: None  Other Topics Concern   Not on file  Social History Narrative   Not on file   Social Drivers of Health   Financial Resource Strain: Medium Risk (05/24/2023)   Overall Financial Resource Strain (CARDIA)    Difficulty of Paying Living Expenses: Somewhat hard  Food Insecurity: No Food Insecurity (02/04/2024)   Hunger Vital Sign    Worried About Running Out of Food in the Last Year: Never true    Ran Out of Food in the Last Year: Never true  Transportation Needs: No Transportation Needs (05/24/2023)   PRAPARE - Administrator, Civil Service (Medical): No    Lack of Transportation (Non-Medical): No  Physical Activity: Sufficiently Active (05/24/2023)   Exercise Vital Sign    Days of Exercise per Week: 7  days    Minutes of Exercise per Session: 30 min  Stress: No Stress Concern Present (05/24/2023)   [employer] of Occupational Health - Occupational Stress Questionnaire    Feeling of Stress : Not at all  Social Connections: Moderately Isolated (05/24/2023)   Social Connection and Isolation Panel    Frequency of Communication with Friends and Family: More than three times a week    Frequency of Social Gatherings with Friends and Family: More than three times a week    Attends Religious Services: Never    Database administrator or Organizations: No    Attends Banker Meetings: Never    Marital Status: Living with partner  Intimate Partner Violence: Not At Risk (05/24/2023)   Humiliation, Afraid, Rape, and Kick questionnaire    Fear of Current or Ex-Partner: No    Emotionally Abused: No    Physically Abused: No    Sexually Abused: No    Physical Exam      Future Appointments  Date Time Provider Department Center  06/05/2024  7:40 AM [facility] H[facility] DEVICE REMOTES [facility]  06/16/2024  2:00 PM Oley Bascom RAMAN, NP [facility] None  09/05/2024  7:40 AM [facility] HVT DEVICE REMOTES [facility]

## 2024-05-29 ENCOUNTER — Ambulatory Visit: Payer: Self-pay

## 2024-05-29 VITALS — Ht 75.0 in | Wt 281.0 lb

## 2024-06-03 ENCOUNTER — Telehealth (HOSPITAL_COMMUNITY): Payer: Self-pay

## 2024-06-03 NOTE — Telephone Encounter (Signed)
 Attempted to reach Mr. Skeens to schedule a paramedicine follow up with no success. LVM. I will continue to follow up.   [name], EMT-Paramedic [phone] 06/03/2024

## 2024-06-04 ENCOUNTER — Other Ambulatory Visit (HOSPITAL_COMMUNITY): Payer: Self-pay

## 2024-06-04 NOTE — Progress Notes (Signed)
 Paramedicine Encounter    Patient ID: [name], male    DOB: 1961-09-10, 63 y.o.   MRN: [id number]   Complaints- none   Assessment- CAOX4, warm and dry ambulatory with no complaints today. Lungs clear, vitals within normal limits. No lower leg swelling. CBG- 167   Compliance with meds- missed one morning and one evening dose   Pill box filled- for one week   Refills needed-  Gababentin Metoprolol  Potassium   Meds changes since last visit- none     Social changes- none    VISIT SUMMARY- Arrived for home visit for [name] who reports to be feeling well with no complaints. Vitals obtained and as noted. Meds reviewed and confirmed- pill box filled for one week. We reviewed meds and got him transferred to [facility]- I contacted same and got his meds transferred after speaking to [place] at [facility]. I reviewed upcoming appointments. Home visit complete. I will plan to see him in one week.   BP 130/72   Pulse (!) 50   Resp 16   Wt 288 lb (130.6 kg)   SpO2 97%   BMI 36.00 kg/m  Weight yesterday-- didn't weigh  Last visit weight-- 293lbs      ACTION: Home visit completed     Patient Care Team: Oley Bascom RAMAN, NP as PCP - General (Pulmonary Disease) Shlomo Wilbert SAUNDERS, MD as PCP - Cardiology (Cardiology) [name] Ezra RAMAN, MD as PCP - Advanced Heart Failure (Cardiology) Cindie Ole DASEN, MD as PCP - Electrophysiology (Cardiology) Cathern Andriette DEL, LCSW as Social Worker (Licensed Clinical Social Worker) Eda Iha, MD (Inactive) as Consulting Physician (Gastroenterology)  Patient Active Problem List   Diagnosis Date Noted   PVC's (premature ventricular contractions) 09/28/2023   Acute right-sided low back pain with right-sided sciatica 05/14/2023   Multiple joint pain 12/22/2022   Obesity (BMI 35.0-39.9 without comorbidity) 07/18/2022   Cellulitis 06/22/2022   Cellulitis, leg 06/20/2022   Presence of Watchman left atrial appendage  closure device 10/28/2021   Atrial fibrillation (HCC) 10/27/2021   Melena    Chronic diastolic CHF (congestive heart failure) (HCC)    GI bleed 06/29/2021   Severe anemia 06/29/2021   Adenomatous polyp of ascending colon    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   Acute blood loss anemia    Angiodysplasia of stomach    Chronic anticoagulation    CHF exacerbation (HCC) 06/15/2020   AKI (acute kidney injury) (HCC) 06/15/2020   CKD (chronic kidney disease), stage III (HCC) 06/15/2020   Anemia due to chronic blood loss    Gastric AVM    Angiodysplasia of duodenum with hemorrhage    Chronic systolic CHF (congestive heart failure) (HCC) 04/05/2020   Anemia 04/05/2020   PAF (paroxysmal atrial fibrillation) (HCC) 04/05/2020   OSA (obstructive sleep apnea) 04/05/2020   Syncope and collapse 04/05/2020   Type 2 diabetes mellitus with hyperglycemia, with long-term current use of insulin  (HCC) 12/03/2019   Diabetic polyneuropathy associated with type 2 diabetes mellitus (HCC) 12/03/2019   Erectile dysfunction 12/03/2019   Paranoid schizophrenia, chronic condition (HCC) 01/26/2015   Severe recurrent major depressive disorder with psychotic features (HCC) 01/26/2015   GAD (generalized anxiety disorder) 01/26/2015   OCD (obsessive compulsive disorder) 01/26/2015   Panic disorder without agoraphobia 01/26/2015   Insomnia 01/26/2015    Current Outpatient Medications:    acetaminophen  (TYLENOL ) 500 MG tablet, Take 2 tablets (1,000 mg total) by mouth 2 (two) times daily as needed., Disp: 180 tablet, Rfl:  3   albuterol  (VENTOLIN  HFA) 108 (90 Base) MCG/ACT inhaler, Inhale 2 puffs into the lungs every 6 (six) hours as needed for wheezing or shortness of breath., Disp: 18 g, Rfl: 1   aspirin  EC 81 MG tablet, Take 81 mg by mouth daily. Swallow whole., Disp: , Rfl:    atorvastatin  (LIPITOR) 40 MG tablet, Take 1 tablet (40 mg total) by mouth daily., Disp: 90 tablet, Rfl: 1    blood glucose meter kit and supplies KIT, Dispense based on patient and insurance preference. Use up to four times daily as directed. (FOR ICD-9 250.00, 250.01)., Disp: 1 each, Rfl: 0   busPIRone  (BUSPAR ) 10 MG tablet, Take 1 tablet by mouth twice daily, Disp: 60 tablet, Rfl: 0   colchicine  0.6 MG tablet, Daily for 7 days then daily PRN for gout flare, Disp: 30 tablet, Rfl: 0   Continuous Glucose Receiver (FREESTYLE LIBRE 3 READER) DEVI, 1 each by Does not apply route daily. Use to check glucose continuously, Disp: 1 each, Rfl: 1   Continuous Glucose Sensor (FREESTYLE LIBRE 3 SENSOR) MISC, PLACE 1 SENSOR ON THE SKIN EVERY 14 DAYS TO CHECK GLUCOSE CONTINUOSLY, Disp: 6 each, Rfl: 0   diclofenac  Sodium (VOLTAREN ) 1 % GEL, Apply 2 g topically daily as needed., Disp: 150 g, Rfl: 2   ferrous sulfate  325 (65 FE) MG tablet, Take 1 tablet (325 mg total) by mouth every other day., Disp: 30 tablet, Rfl: 1   furosemide  (LASIX ) 40 MG tablet, Take 1 tablet (40 mg total) by mouth 2 (two) times daily. (Patient taking differently: Take 60 mg by mouth 2 (two) times daily.), Disp: 90 tablet, Rfl: 0   gabapentin  (NEURONTIN ) 800 MG tablet, TAKE 1 TABLET BY MOUTH THREE TIMES DAILY, Disp: 90 tablet, Rfl: 0   HUMALOG  MIX 75/25 KWIKPEN (75-25) 100 UNIT/ML KwikPen, DIAL 30 UNITS AND INJECT UNDER THE SKIN TWICE DAILY. MAX DAILY DOSE: 60 UNITS., Disp: 60 mL, Rfl: 0   metFORMIN  (GLUCOPHAGE -XR) 500 MG 24 hr tablet, Take 1 tablet (500 mg total) by mouth 2 (two) times daily with a meal., Disp: 60 tablet, Rfl: 0   metoprolol  succinate (TOPROL -XL) 50 MG 24 hr tablet, Take 1.5 tablets (75 mg total) by mouth daily., Disp: 45 tablet, Rfl: 11   Multiple Vitamins-Minerals (CENTRUM SILVER 50+MEN) TABS, Take 1 tablet by mouth daily., Disp: , Rfl:    Omega-3 Fatty Acids (FISH OIL) 1000 MG CAPS, Take 1,000 mg by mouth at bedtime., Disp: , Rfl:    pantoprazole  (PROTONIX ) 40 MG tablet, Take 1 tablet (40 mg total) by mouth daily., Disp: 90 tablet,  Rfl: 0   potassium chloride  SA (KLOR-CON  M) 20 MEQ tablet, Take 2 tablets (40 mEq total) by mouth daily., Disp: 60 tablet, Rfl: 6   QUEtiapine  (SEROQUEL ) 200 MG tablet, Take 200 mg by mouth at bedtime., Disp: , Rfl:    RELION PEN NEEDLES 31G X 6 MM MISC, INJECT 1 PEN INTO THE SKIN 2 TIMES DAILY, Disp: 100 each, Rfl: 0   sacubitril -valsartan  (ENTRESTO ) 24-26 MG, Take 1 tablet by mouth 2 (two) times daily., Disp: 180 tablet, Rfl: 1   Semaglutide, 2 MG/DOSE, (OZEMPIC, 2 MG/DOSE,) 8 MG/3ML SOPN, Inject 2 mg into the skin once a week. Monday, Disp: , Rfl:    spironolactone  (ALDACTONE ) 25 MG tablet, Take 1 tablet (25 mg total) by mouth daily., Disp: 90 tablet, Rfl: 3   DULoxetine  (CYMBALTA ) 30 MG capsule, Take 1 capsule by mouth once daily (Patient not taking: Reported on  06/04/2024), Disp: 30 capsule, Rfl: 2   empagliflozin  (JARDIANCE ) 10 MG TABS tablet, Take 1 tablet (10 mg total) by mouth daily., Disp: 90 tablet, Rfl: 3   escitalopram  (LEXAPRO ) 10 MG tablet, Take 10 mg by mouth daily. Prescribed by Dr. JONELLE. Dimkpa (Patient not taking: Reported on 06/04/2024), Disp: , Rfl:    nicotine  (NICODERM CQ  - DOSED IN MG/24 HOURS) 14 mg/24hr patch, Place 1 patch (14 mg total) onto the skin daily. (Patient not taking: Reported on 05/28/2024), Disp: 28 patch, Rfl: 0   nicotine  (NICODERM CQ  - DOSED IN MG/24 HOURS) 21 mg/24hr patch, Place 1 patch (21 mg total) onto the skin daily. (Patient not taking: Reported on 05/28/2024), Disp: 28 patch, Rfl: 0   nicotine  (NICODERM CQ  - DOSED IN MG/24 HR) 7 mg/24hr patch, Place 1 patch (7 mg total) onto the skin daily. (Patient not taking: Reported on 05/28/2024), Disp: 28 patch, Rfl: 0   sildenafil  (VIAGRA ) 100 MG tablet, TAKE 1/2 TO 1 (ONE-HALF TO ONE) TABLET BY MOUTH ONCE DAILY AS NEEDED FOR ERECTILE DYSFUNCTION (Patient not taking: Reported on 06/04/2024), Disp: 5 tablet, Rfl: 0   tadalafil  (CIALIS ) 20 MG tablet, Take 20 mg by mouth daily as needed. (Patient not taking: Reported on 06/04/2024),  Disp: , Rfl:    Vitamin D , Ergocalciferol , (DRISDOL ) 1.25 MG (50000 UNIT) CAPS capsule, Take 1 capsule by mouth once a week (Patient not taking: Reported on 06/04/2024), Disp: 5 capsule, Rfl: 0   zinc gluconate 50 MG tablet, Take 50 mg by mouth daily. (Patient not taking: Reported on 06/04/2024), Disp: , Rfl:  No Known Allergies   Social History   Socioeconomic History   Marital status: Legally Separated    Spouse name: Not on file   Number of children: Not on file   Years of education: Not on file   Highest education level: Not on file  Occupational History   Not on file  Tobacco Use   Smoking status: Never    Passive exposure: Never   Smokeless tobacco: Never  Vaping Use   Vaping status: Never Used  Substance and Sexual Activity   Alcohol use: Not Currently    Alcohol/week: 2.0 standard drinks of alcohol    Types: 2 Cans of beer per week   Drug use: No   Sexual activity: Yes    Birth control/protection: None  Other Topics Concern   Not on file  Social History Narrative   Not on file   Social Drivers of Health   Financial Resource Strain: Medium Risk (05/24/2023)   Overall Financial Resource Strain (CARDIA)    Difficulty of Paying Living Expenses: Somewhat hard  Food Insecurity: No Food Insecurity (02/04/2024)   Hunger Vital Sign    Worried About Running Out of Food in the Last Year: Never true    Ran Out of Food in the Last Year: Never true  Transportation Needs: No Transportation Needs (05/24/2023)   PRAPARE - Administrator, Civil Service (Medical): No    Lack of Transportation (Non-Medical): No  Physical Activity: Sufficiently Active (05/24/2023)   Exercise Vital Sign    Days of Exercise per Week: 7 days    Minutes of Exercise per Session: 30 min  Stress: No Stress Concern Present (05/24/2023)   [employer] of Occupational Health - Occupational Stress Questionnaire    Feeling of Stress : Not at all  Social Connections: Moderately Isolated (05/24/2023)    Social Connection and Isolation Panel    Frequency of Communication  with Friends and Family: More than three times a week    Frequency of Social Gatherings with Friends and Family: More than three times a week    Attends Religious Services: Never    Database administrator or Organizations: No    Attends Banker Meetings: Never    Marital Status: Living with partner  Intimate Partner Violence: Not At Risk (05/24/2023)   Humiliation, Afraid, Rape, and Kick questionnaire    Fear of Current or Ex-Partner: No    Emotionally Abused: No    Physically Abused: No    Sexually Abused: No    Physical Exam      Future Appointments  Date Time Provider Department Center  06/05/2024  7:40 AM [facility] HVT DEVICE REMOTES [facility]  06/16/2024  2:00 PM Oley Bascom RAMAN, NP [facility] None  09/05/2024  7:40 AM [facility] HVT DEVICE REMOTES [facility]

## 2024-06-05 ENCOUNTER — Ambulatory Visit (INDEPENDENT_AMBULATORY_CARE_PROVIDER_SITE_OTHER): Payer: Medicare Other

## 2024-06-05 DIAGNOSIS — I5022 Chronic systolic (congestive) heart failure: Secondary | ICD-10-CM

## 2024-06-06 LAB — CUP PACEART REMOTE DEVICE CHECK
Date Time Interrogation Session: 20250710104025
Implantable Lead Connection Status: 753985
Implantable Lead Implant Date: 20211008
Implantable Lead Location: 753860
Implantable Lead Model: 436910
Implantable Lead Serial Number: 81404997
Implantable Pulse Generator Implant Date: 20211008
Pulse Gen Model: 429525
Pulse Gen Serial Number: 84810752

## 2024-06-07 ENCOUNTER — Ambulatory Visit: Payer: Self-pay | Admitting: Cardiology

## 2024-06-10 ENCOUNTER — Other Ambulatory Visit (HOSPITAL_COMMUNITY): Payer: Self-pay

## 2024-06-10 NOTE — Progress Notes (Signed)
 Paramedicine Encounter    Patient ID: [name], male    DOB: 1961/11/14, 63 y.o.   MRN: [id number]   Compliance with meds- missed one evening dose   Pill box filled- one week   Refills needed- potassium, gabapentin    Meds changes since last visit- none     Social changes- [facility]    VISIT SUMMARY*- Met for a med rec only as he reports he has some where to be. I successfully filled pill box- we [facility]s [facility] with [facility] and will be transitioning to bubble packs in the near future. Visit complete.      ACTION: Home visit completed     Patient Care Team: Oley Bascom RAMAN, NP as PCP - General (Pulmonary Disease) Shlomo Wilbert SAUNDERS, MD as PCP - Cardiology (Cardiology) [name] Ezra RAMAN, MD as PCP - Advanced Heart Failure (Cardiology) Cindie Ole DASEN, MD as PCP - Electrophysiology (Cardiology) Cathern Andriette DEL, LCSW as Social Worker (Licensed Clinical Social Worker) Eda Iha, MD (Inactive) as Consulting Physician (Gastroenterology)  Patient Active Problem List   Diagnosis Date Noted   PVC's (premature ventricular contractions) 09/28/2023   Acute right-sided low back pain with right-sided sciatica 05/14/2023   Multiple joint pain 12/22/2022   Obesity (BMI 35.0-39.9 without comorbidity) 07/18/2022   Cellulitis 06/22/2022   Cellulitis, leg 06/20/2022   Presence of Watchman left atrial appendage closure device 10/28/2021   Atrial fibrillation (HCC) 10/27/2021   Melena    Chronic diastolic CHF (congestive heart failure) (HCC)    GI bleed 06/29/2021   Severe anemia 06/29/2021   Adenomatous polyp of ascending colon    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   Acute blood loss anemia    Angiodysplasia of stomach    Chronic anticoagulation    CHF exacerbation (HCC) 06/15/2020   AKI (acute kidney injury) (HCC) 06/15/2020   CKD (chronic kidney disease), stage III (HCC) 06/15/2020    Anemia due to chronic blood loss    Gastric AVM    Angiodysplasia of duodenum with hemorrhage    Chronic systolic CHF (congestive heart failure) (HCC) 04/05/2020   Anemia 04/05/2020   PAF (paroxysmal atrial fibrillation) (HCC) 04/05/2020   OSA (obstructive sleep apnea) 04/05/2020   Syncope and collapse 04/05/2020   Type 2 diabetes mellitus with hyperglycemia, with long-term current use of insulin  (HCC) 12/03/2019   Diabetic polyneuropathy associated with type 2 diabetes mellitus (HCC) 12/03/2019   Erectile dysfunction 12/03/2019   Paranoid schizophrenia, chronic condition (HCC) 01/26/2015   Severe recurrent major depressive disorder with psychotic features (HCC) 01/26/2015   GAD (generalized anxiety disorder) 01/26/2015   OCD (obsessive compulsive disorder) 01/26/2015   Panic disorder without agoraphobia 01/26/2015   Insomnia 01/26/2015    Current Outpatient Medications:    acetaminophen  (TYLENOL ) 500 MG tablet, Take 2 tablets (1,000 mg total) by mouth 2 (two) times daily as needed., Disp: 180 tablet, Rfl: 3   albuterol  (VENTOLIN  HFA) 108 (90 Base) MCG/ACT inhaler, Inhale 2 puffs into the lungs every 6 (six) hours as needed for wheezing or shortness of breath., Disp: 18 g, Rfl: 1   aspirin  EC 81 MG tablet, Take 81 mg by mouth daily. Swallow whole., Disp: , Rfl:    atorvastatin  (LIPITOR) 40 MG tablet, Take 1 tablet (40 mg total) by mouth daily., Disp: 90 tablet, Rfl: 1   blood glucose meter kit and supplies KIT, Dispense based on patient and insurance preference. Use up to four times daily as directed. (  FOR ICD-9 250.00, 250.01)., Disp: 1 each, Rfl: 0   busPIRone  (BUSPAR ) 10 MG tablet, Take 1 tablet by mouth twice daily, Disp: 60 tablet, Rfl: 0   colchicine  0.6 MG tablet, Daily for 7 days then daily PRN for gout flare, Disp: 30 tablet, Rfl: 0   Continuous Glucose Receiver (FREESTYLE LIBRE 3 READER) DEVI, 1 each by Does not apply route daily. Use to check glucose continuously, Disp: 1 each,  Rfl: 1   Continuous Glucose Sensor (FREESTYLE LIBRE 3 SENSOR) MISC, PLACE 1 SENSOR ON THE SKIN EVERY 14 DAYS TO CHECK GLUCOSE CONTINUOSLY, Disp: 6 each, Rfl: 0   diclofenac  Sodium (VOLTAREN ) 1 % GEL, Apply 2 g topically daily as needed., Disp: 150 g, Rfl: 2   DULoxetine  (CYMBALTA ) 30 MG capsule, Take 1 capsule by mouth once daily (Patient not taking: Reported on 06/04/2024), Disp: 30 capsule, Rfl: 2   empagliflozin  (JARDIANCE ) 10 MG TABS tablet, Take 1 tablet (10 mg total) by mouth daily., Disp: 90 tablet, Rfl: 3   escitalopram  (LEXAPRO ) 10 MG tablet, Take 10 mg by mouth daily. Prescribed by Dr. JONELLE. Dimkpa (Patient not taking: Reported on 06/04/2024), Disp: , Rfl:    ferrous sulfate  325 (65 FE) MG tablet, Take 1 tablet (325 mg total) by mouth every other day., Disp: 30 tablet, Rfl: 1   furosemide  (LASIX ) 40 MG tablet, Take 1 tablet (40 mg total) by mouth 2 (two) times daily. (Patient taking differently: Take 60 mg by mouth 2 (two) times daily.), Disp: 90 tablet, Rfl: 0   gabapentin  (NEURONTIN ) 800 MG tablet, TAKE 1 TABLET BY MOUTH THREE TIMES DAILY, Disp: 90 tablet, Rfl: 0   HUMALOG  MIX 75/25 KWIKPEN (75-25) 100 UNIT/ML KwikPen, DIAL 30 UNITS AND INJECT UNDER THE SKIN TWICE DAILY. MAX DAILY DOSE: 60 UNITS., Disp: 60 mL, Rfl: 0   metFORMIN  (GLUCOPHAGE -XR) 500 MG 24 hr tablet, Take 1 tablet (500 mg total) by mouth 2 (two) times daily with a meal., Disp: 60 tablet, Rfl: 0   metoprolol  succinate (TOPROL -XL) 50 MG 24 hr tablet, Take 1.5 tablets (75 mg total) by mouth daily., Disp: 45 tablet, Rfl: 11   Multiple Vitamins-Minerals (CENTRUM SILVER 50+MEN) TABS, Take 1 tablet by mouth daily., Disp: , Rfl:    nicotine  (NICODERM CQ  - DOSED IN MG/24 HOURS) 14 mg/24hr patch, Place 1 patch (14 mg total) onto the skin daily. (Patient not taking: Reported on 05/28/2024), Disp: 28 patch, Rfl: 0   nicotine  (NICODERM CQ  - DOSED IN MG/24 HOURS) 21 mg/24hr patch, Place 1 patch (21 mg total) onto the skin daily. (Patient not  taking: Reported on 05/28/2024), Disp: 28 patch, Rfl: 0   nicotine  (NICODERM CQ  - DOSED IN MG/24 HR) 7 mg/24hr patch, Place 1 patch (7 mg total) onto the skin daily. (Patient not taking: Reported on 05/28/2024), Disp: 28 patch, Rfl: 0   Omega-3 Fatty Acids (FISH OIL) 1000 MG CAPS, Take 1,000 mg by mouth at bedtime., Disp: , Rfl:    pantoprazole  (PROTONIX ) 40 MG tablet, Take 1 tablet (40 mg total) by mouth daily., Disp: 90 tablet, Rfl: 0   potassium chloride  SA (KLOR-CON  M) 20 MEQ tablet, Take 2 tablets (40 mEq total) by mouth daily., Disp: 60 tablet, Rfl: 6   QUEtiapine  (SEROQUEL ) 200 MG tablet, Take 200 mg by mouth at bedtime., Disp: , Rfl:    RELION PEN NEEDLES 31G X 6 MM MISC, INJECT 1 PEN INTO THE SKIN 2 TIMES DAILY, Disp: 100 each, Rfl: 0   sacubitril -valsartan  (ENTRESTO ) 24-26 MG,  Take 1 tablet by mouth 2 (two) times daily., Disp: 180 tablet, Rfl: 1   Semaglutide, 2 MG/DOSE, (OZEMPIC, 2 MG/DOSE,) 8 MG/3ML SOPN, Inject 2 mg into the skin once a week. Monday, Disp: , Rfl:    sildenafil  (VIAGRA ) 100 MG tablet, TAKE 1/2 TO 1 (ONE-HALF TO ONE) TABLET BY MOUTH ONCE DAILY AS NEEDED FOR ERECTILE DYSFUNCTION (Patient not taking: Reported on 06/04/2024), Disp: 5 tablet, Rfl: 0   spironolactone  (ALDACTONE ) 25 MG tablet, Take 1 tablet (25 mg total) by mouth daily., Disp: 90 tablet, Rfl: 3   tadalafil  (CIALIS ) 20 MG tablet, Take 20 mg by mouth daily as needed. (Patient not taking: Reported on 06/04/2024), Disp: , Rfl:    Vitamin D , Ergocalciferol , (DRISDOL ) 1.25 MG (50000 UNIT) CAPS capsule, Take 1 capsule by mouth once a week (Patient not taking: Reported on 06/04/2024), Disp: 5 capsule, Rfl: 0   zinc gluconate 50 MG tablet, Take 50 mg by mouth daily. (Patient not taking: Reported on 06/04/2024), Disp: , Rfl:  No Known Allergies   Social History   Socioeconomic History   Marital status: Legally Separated    Spouse name: Not on file   Number of children: Not on file   Years of education: Not on file   Highest  education level: Not on file  Occupational History   Not on file  Tobacco Use   Smoking status: Never    Passive exposure: Never   Smokeless tobacco: Never  Vaping Use   Vaping status: Never Used  Substance and Sexual Activity   Alcohol use: Not Currently    Alcohol/week: 2.0 standard drinks of alcohol    Types: 2 Cans of beer per week   Drug use: No   Sexual activity: Yes    Birth control/protection: None  Other Topics Concern   Not on file  Social History Narrative   Not on file   Social Drivers of Health   Financial Resource Strain: Medium Risk (05/24/2023)   Overall Financial Resource Strain (CARDIA)    Difficulty of Paying Living Expenses: Somewhat hard  Food Insecurity: No Food Insecurity (02/04/2024)   Hunger Vital Sign    Worried About Running Out of Food in the Last Year: Never true    Ran Out of Food in the Last Year: Never true  Transportation Needs: No Transportation Needs (05/24/2023)   PRAPARE - Administrator, Civil Service (Medical): No    Lack of Transportation (Non-Medical): No  Physical Activity: Sufficiently Active (05/24/2023)   Exercise Vital Sign    Days of Exercise per Week: 7 days    Minutes of Exercise per Session: 30 min  Stress: No Stress Concern Present (05/24/2023)   [employer] of Occupational Health - Occupational Stress Questionnaire    Feeling of Stress : Not at all  Social Connections: Moderately Isolated (05/24/2023)   Social Connection and Isolation Panel    Frequency of Communication with Friends and Family: More than three times a week    Frequency of Social Gatherings with Friends and Family: More than three times a week    Attends Religious Services: Never    Database administrator or Organizations: No    Attends Banker Meetings: Never    Marital Status: Living with partner  Intimate Partner Violence: Not At Risk (05/24/2023)   Humiliation, Afraid, Rape, and Kick questionnaire    Fear of Current or  Ex-Partner: No    Emotionally Abused: No    Physically Abused: No  Sexually Abused: No    Physical Exam      Future Appointments  Date Time Provider Department Center  06/16/2024  2:00 PM Oley Bascom RAMAN, NP [facility] None  09/05/2024  7:40 AM [facility] DEVICE REMOTES [facility]

## 2024-06-16 ENCOUNTER — Ambulatory Visit: Payer: Self-pay | Admitting: Nurse Practitioner

## 2024-06-16 ENCOUNTER — Other Ambulatory Visit: Payer: Self-pay

## 2024-06-16 ENCOUNTER — Encounter: Payer: Self-pay | Admitting: Nurse Practitioner

## 2024-06-16 VITALS — BP 130/84 | HR 74 | Temp 98.4°F | Wt 294.0 lb

## 2024-06-16 DIAGNOSIS — M65342 Trigger finger, left ring finger: Secondary | ICD-10-CM

## 2024-06-16 DIAGNOSIS — Z716 Tobacco abuse counseling: Secondary | ICD-10-CM

## 2024-06-16 DIAGNOSIS — F419 Anxiety disorder, unspecified: Secondary | ICD-10-CM

## 2024-06-16 DIAGNOSIS — E1165 Type 2 diabetes mellitus with hyperglycemia: Secondary | ICD-10-CM | POA: Diagnosis not present

## 2024-06-16 DIAGNOSIS — Z794 Long term (current) use of insulin: Secondary | ICD-10-CM

## 2024-06-16 DIAGNOSIS — R7309 Other abnormal glucose: Secondary | ICD-10-CM

## 2024-06-16 LAB — POCT GLYCOSYLATED HEMOGLOBIN (HGB A1C): Hemoglobin A1C: 8.1 % — AB (ref 4.0–5.6)

## 2024-06-16 MED ORDER — OZEMPIC (2 MG/DOSE) 8 MG/3ML ~~LOC~~ SOPN: 2.0000 mg | PEN_INJECTOR | SUBCUTANEOUS | 2 refills | Status: AC

## 2024-06-16 MED ORDER — PANTOPRAZOLE SODIUM 40 MG PO TBEC: 40.0000 mg | DELAYED_RELEASE_TABLET | Freq: Every day | ORAL | 0 refills | Status: DC

## 2024-06-16 MED ORDER — BUSPIRONE HCL 10 MG PO TABS: 10.0000 mg | ORAL_TABLET | Freq: Two times a day (BID) | ORAL | 0 refills | Status: DC

## 2024-06-16 MED ORDER — COLCHICINE 0.6 MG PO TABS: ORAL_TABLET | ORAL | 0 refills | Status: DC

## 2024-06-16 MED ORDER — METOPROLOL SUCCINATE ER 50 MG PO TB24: 75.0000 mg | ORAL_TABLET | Freq: Every day | ORAL | 11 refills | Status: DC

## 2024-06-16 MED ORDER — DICLOFENAC SODIUM 3 % EX GEL: 1.0000 | Freq: Every day | CUTANEOUS | 2 refills | Status: DC | PRN

## 2024-06-16 MED ORDER — VARENICLINE TARTRATE (STARTER) 0.5 MG X 11 & 1 MG X 42 PO TBPK: ORAL_TABLET | ORAL | 0 refills | Status: DC

## 2024-06-16 MED ORDER — SPIRONOLACTONE 25 MG PO TABS: 25.0000 mg | ORAL_TABLET | Freq: Every day | ORAL | 3 refills | Status: DC

## 2024-06-16 MED ORDER — EMPAGLIFLOZIN 10 MG PO TABS: 10.0000 mg | ORAL_TABLET | Freq: Every day | ORAL | 3 refills | Status: DC

## 2024-06-16 MED ORDER — ATORVASTATIN CALCIUM 40 MG PO TABS: 40.0000 mg | ORAL_TABLET | Freq: Every day | ORAL | 1 refills | Status: DC

## 2024-06-16 MED ORDER — FUROSEMIDE 40 MG PO TABS: 40.0000 mg | ORAL_TABLET | Freq: Two times a day (BID) | ORAL | 0 refills | Status: DC

## 2024-06-16 MED ORDER — SACUBITRIL-VALSARTAN 24-26 MG PO TABS: 1.0000 | ORAL_TABLET | Freq: Two times a day (BID) | ORAL | 1 refills | Status: DC

## 2024-06-16 NOTE — Progress Notes (Signed)
 Subjective   Patient ID: [name], male    DOB: [birth year], 63 y.o.   MRN: [id number]  Chief Complaint  Patient presents with   Diabetes    Referring provider: Oley Bascom RAMAN, NP  [name] is a 63 y.o. male with Past Medical History: No date: AICD (automatic cardioverter/defibrillator) present No date: Anxiety No date: Atrial fibrillation (HCC) No date: CAD (coronary artery disease) No date: Cardiomyopathy (HCC) 09/2019: CHF (congestive heart failure) (HCC) No date: Depression No date: Diabetes mellitus 11/2019: Erectile dysfunction No date: GI bleeding 09/2019: H/O right heart catheterization No date: Hypertension No date: Presence of permanent cardiac pacemaker 10/27/2021: Presence of Watchman left atrial appendage closure device     Comment:  27 mm Watchman Flex Device per Dr. Cindie No date: Schizophrenia (HCC) No date: Sleep apnea     Comment:  uses cpap   HPI  Patient presents today for diabetes follow-up.  A1c in office today is 8.1.  Pharmacist is following patient for diabetic medication management.  Patient states that he has not been compliant with diet.  He states that he will do better about limiting concentrated sweets.  Blood pressure was slightly elevated in office today.  Patient states that he did eat to bacon sandwiches last night.. Denies chest pain or edema.   Note: Patient complains today of trigger finger to left ring finger.  We will place a referral to hand surgery.  Patient also would like to stop smoking.  We will trial Chantix .  No Known Allergies  Immunization History  Administered Date(s) Administered   Influenza,inj,Quad PF,6+ Mos 10/13/2019, 09/13/2020, 08/03/2021, 11/01/2022   PFIZER(Purple Top)SARS-COV-2 Vaccination 03/13/2020   Pneumococcal Polysaccharide-23 10/16/2019   Tdap 09/13/2020   Zoster Recombinant(Shingrix) 08/03/2021    Tobacco History: Social History   Tobacco Use  Smoking Status Never    Passive exposure: Never  Smokeless Tobacco Never   Counseling given: Not Answered   Outpatient Encounter Medications as of 06/16/2024  Medication Sig   acetaminophen  (TYLENOL ) 500 MG tablet Take 2 tablets (1,000 mg total) by mouth 2 (two) times daily as needed.   albuterol  (VENTOLIN  HFA) 108 (90 Base) MCG/ACT inhaler Inhale 2 puffs into the lungs every 6 (six) hours as needed for wheezing or shortness of breath.   aspirin  EC 81 MG tablet Take 81 mg by mouth daily. Swallow whole.   blood glucose meter kit and supplies KIT Dispense based on patient and insurance preference. Use up to four times daily as directed. (FOR ICD-9 250.00, 250.01).   Continuous Glucose Receiver (FREESTYLE LIBRE 3 READER) DEVI 1 each by Does not apply route daily. Use to check glucose continuously   Continuous Glucose Sensor (FREESTYLE LIBRE 3 SENSOR) MISC PLACE 1 SENSOR ON THE SKIN EVERY 14 DAYS TO CHECK GLUCOSE CONTINUOSLY   diclofenac  Sodium (VOLTAREN ) 1 % GEL Apply 2 g topically daily as needed.   Diclofenac  Sodium 3 % GEL Apply 1 Application topically daily as needed.   gabapentin  (NEURONTIN ) 800 MG tablet TAKE 1 TABLET BY MOUTH THREE TIMES DAILY   HUMALOG  MIX 75/25 KWIKPEN (75-25) 100 UNIT/ML KwikPen DIAL 30 UNITS AND INJECT UNDER THE SKIN TWICE DAILY. MAX DAILY DOSE: 60 UNITS.   metFORMIN  (GLUCOPHAGE -XR) 500 MG 24 hr tablet Take 1 tablet (500 mg total) by mouth 2 (two) times daily with a meal.   Multiple Vitamins-Minerals (CENTRUM SILVER 50+MEN) TABS Take 1 tablet by mouth daily.   Omega-3 Fatty Acids (FISH OIL) 1000 MG CAPS Take 1,000  mg by mouth at bedtime.   potassium chloride  SA (KLOR-CON  M) 20 MEQ tablet Take 2 tablets (40 mEq total) by mouth daily.   QUEtiapine  (SEROQUEL ) 200 MG tablet Take 200 mg by mouth at bedtime.   RELION PEN NEEDLES 31G X 6 MM MISC INJECT 1 PEN INTO THE SKIN 2 TIMES DAILY   Varenicline  Tartrate, Starter, (CHANTIX  STARTING MONTH PAK) 0.5 MG X 11 & 1 MG X 42 TBPK Take as instructed on  packaging   [DISCONTINUED] atorvastatin  (LIPITOR) 40 MG tablet Take 1 tablet (40 mg total) by mouth daily.   [DISCONTINUED] busPIRone  (BUSPAR ) 10 MG tablet Take 1 tablet by mouth twice daily   [DISCONTINUED] colchicine  0.6 MG tablet Daily for 7 days then daily PRN for gout flare   [DISCONTINUED] empagliflozin  (JARDIANCE ) 10 MG TABS tablet Take 1 tablet (10 mg total) by mouth daily.   [DISCONTINUED] metoprolol  succinate (TOPROL -XL) 50 MG 24 hr tablet Take 1.5 tablets (75 mg total) by mouth daily.   [DISCONTINUED] pantoprazole  (PROTONIX ) 40 MG tablet Take 1 tablet (40 mg total) by mouth daily.   [DISCONTINUED] sacubitril -valsartan  (ENTRESTO ) 24-26 MG Take 1 tablet by mouth 2 (two) times daily.   [DISCONTINUED] Semaglutide , 2 MG/DOSE, (OZEMPIC , 2 MG/DOSE,) 8 MG/3ML SOPN Inject 2 mg into the skin once a week. Monday   [DISCONTINUED] spironolactone  (ALDACTONE ) 25 MG tablet Take 1 tablet (25 mg total) by mouth daily.   atorvastatin  (LIPITOR) 40 MG tablet Take 1 tablet (40 mg total) by mouth daily.   busPIRone  (BUSPAR ) 10 MG tablet Take 1 tablet (10 mg total) by mouth 2 (two) times daily.   colchicine  0.6 MG tablet Daily for 7 days then daily PRN for gout flare   DULoxetine  (CYMBALTA ) 30 MG capsule Take 1 capsule by mouth once daily (Patient not taking: Reported on 06/10/2024)   empagliflozin  (JARDIANCE ) 10 MG TABS tablet Take 1 tablet (10 mg total) by mouth daily.   escitalopram  (LEXAPRO ) 10 MG tablet Take 10 mg by mouth daily. Prescribed by Dr. JONELLE. Dimkpa (Patient not taking: Reported on 06/10/2024)   ferrous sulfate  325 (65 FE) MG tablet Take 1 tablet (325 mg total) by mouth every other day.   furosemide  (LASIX ) 40 MG tablet Take 1 tablet (40 mg total) by mouth 2 (two) times daily.   metoprolol  succinate (TOPROL -XL) 50 MG 24 hr tablet Take 1.5 tablets (75 mg total) by mouth daily.   nicotine  (NICODERM CQ  - DOSED IN MG/24 HOURS) 14 mg/24hr patch Place 1 patch (14 mg total) onto the skin daily. (Patient  not taking: Reported on 05/28/2024)   nicotine  (NICODERM CQ  - DOSED IN MG/24 HOURS) 21 mg/24hr patch Place 1 patch (21 mg total) onto the skin daily. (Patient not taking: Reported on 05/28/2024)   nicotine  (NICODERM CQ  - DOSED IN MG/24 HR) 7 mg/24hr patch Place 1 patch (7 mg total) onto the skin daily. (Patient not taking: Reported on 05/28/2024)   pantoprazole  (PROTONIX ) 40 MG tablet Take 1 tablet (40 mg total) by mouth daily.   sacubitril -valsartan  (ENTRESTO ) 24-26 MG Take 1 tablet by mouth 2 (two) times daily.   Semaglutide , 2 MG/DOSE, (OZEMPIC , 2 MG/DOSE,) 8 MG/3ML SOPN Inject 2 mg into the skin once a week. Monday   sildenafil  (VIAGRA ) 100 MG tablet TAKE 1/2 TO 1 (ONE-HALF TO ONE) TABLET BY MOUTH ONCE DAILY AS NEEDED FOR ERECTILE DYSFUNCTION (Patient not taking: Reported on 06/04/2024)   spironolactone  (ALDACTONE ) 25 MG tablet Take 1 tablet (25 mg total) by mouth daily.   tadalafil  (  CIALIS ) 20 MG tablet Take 20 mg by mouth daily as needed. (Patient not taking: Reported on 06/04/2024)   Vitamin D , Ergocalciferol , (DRISDOL ) 1.25 MG (50000 UNIT) CAPS capsule Take 1 capsule by mouth once a week (Patient not taking: Reported on 06/04/2024)   zinc gluconate 50 MG tablet Take 50 mg by mouth daily. (Patient not taking: Reported on 06/04/2024)   [DISCONTINUED] furosemide  (LASIX ) 40 MG tablet Take 1 tablet (40 mg total) by mouth 2 (two) times daily. (Patient taking differently: Take 60 mg by mouth 2 (two) times daily.)   No facility-administered encounter medications on file as of 06/16/2024.    Review of Systems  Review of Systems  Constitutional: Negative.   HENT: Negative.    Cardiovascular: Negative.   Gastrointestinal: Negative.   Allergic/Immunologic: Negative.   Neurological: Negative.   Psychiatric/Behavioral: Negative.       Objective:   BP 130/84   Pulse 74   Temp 98.4 F (36.9 C) (Oral)   Wt 294 lb (133.4 kg)   SpO2 99%   BMI 36.75 kg/m   Wt Readings from Last 5 Encounters:  06/16/24  294 lb (133.4 kg)  06/04/24 288 lb (130.6 kg)  05/27/24 281 lb (127.5 kg)  05/28/24 293 lb (132.9 kg)  04/30/24 277 lb (125.6 kg)     Physical Exam Vitals and nursing note reviewed.  Constitutional:      General: He is not in acute distress.    Appearance: He is well-developed.  Cardiovascular:     Rate and Rhythm: Normal rate and regular rhythm.  Pulmonary:     Effort: Pulmonary effort is normal.     Breath sounds: Normal breath sounds.  Skin:    General: Skin is warm and dry.  Neurological:     Mental Status: He is alert and oriented to person, place, and time.       Assessment & Plan:   Type 2 diabetes mellitus with hyperglycemia, with long-term current use of insulin  (HCC) -     POCT glycosylated hemoglobin (Hb A1C) -     AMB Referral [facility] Management  Trigger ring finger of left hand -     Ambulatory referral to Hand Surgery  Anxiety -     busPIRone  HCl; Take 1 tablet (10 mg total) by mouth 2 (two) times daily.  Dispense: 60 tablet; Refill: 0  Hemoglobin A1C greater than 9%, indicating poor diabetic control  Encounter for smoking cessation counseling -     Varenicline  Tartrate (Starter); Take as instructed on packaging  Dispense: 1 each; Refill: 0  Other orders -     Atorvastatin  Calcium ; Take 1 tablet (40 mg total) by mouth daily.  Dispense: 90 tablet; Refill: 1 -     Colchicine ; Daily for 7 days then daily PRN for gout flare  Dispense: 30 tablet; Refill: 0 -     Empagliflozin ; Take 1 tablet (10 mg total) by mouth daily.  Dispense: 90 tablet; Refill: 3 -     Furosemide ; Take 1 tablet (40 mg total) by mouth 2 (two) times daily.  Dispense: 90 tablet; Refill: 0 -     Metoprolol  Succinate ER; Take 1.5 tablets (75 mg total) by mouth daily.  Dispense: 45 tablet; Refill: 11 -     Pantoprazole  Sodium; Take 1 tablet (40 mg total) by mouth daily.  Dispense: 90 tablet; Refill: 0 -     Sacubitril -Valsartan ; Take 1 tablet by mouth 2 (two) times daily.  Dispense: 180  tablet; Refill: 1 -  Ozempic  (2 MG/DOSE); Inject 2 mg into the skin once a week. Monday  Dispense: 3 mL; Refill: 2 -     Spironolactone ; Take 1 tablet (25 mg total) by mouth daily.  Dispense: 90 tablet; Refill: 3 -     Diclofenac  Sodium; Apply 1 Application topically daily as needed.  Dispense: 100 g; Refill: 2     Return in about 3 months (around 09/16/2024).   Bascom GORMAN Borer, NP 06/16/2024

## 2024-06-16 NOTE — Patient Instructions (Addendum)
 Varenicline  Tablets What is this medication? VARENICLINE  (var e NI kleen) helps you quit smoking. It reduces cravings for nicotine , the addictive substance found in tobacco. It is most effective when used in combination with a stop-smoking program. This medicine may be used for other purposes; ask your health care provider or pharmacist if you have questions. COMMON BRAND NAME(S): Chantix  What should I tell my care team before I take this medication? They need to know if you have any of these conditions: Heart disease Frequently drink alcohol Kidney disease Mental health condition On hemodialysis Seizures History of stroke Suicidal thoughts, plans, or attempt by you or a family member An unusual or allergic reaction to varenicline , other medications, foods, dyes, or preservatives Pregnant or trying to get pregnant Breast-feeding How should I use this medication? Take this medication by mouth after eating. Take with a full glass of water. Follow the directions on the prescription label. Take your doses at regular intervals. Do not take your medication more often than directed. There are 3 ways you can use this medication to help you quit smoking; talk to your care team to decide which plan is right for you: 1) you can choose a quit date and start this medication 1 week before the quit date, or, 2) you can start taking this medication before you choose a quit date, and then pick a quit date between day 8 and 35 days of treatment, or, 3) if you are not sure that you are able or willing to quit smoking right away, start taking this medication and slowly decrease the amount you smoke as directed by your care team with the goal of being cigarette-free by week 12 of treatment. Stick to your plan; ask about support groups or other ways to help you remain cigarette-free. If you are motivated to quit smoking and did not succeed during a previous attempt with this medication for reasons other than side  effects, or if you returned to smoking after this treatment, speak with your care team about whether another course of this medication may be right for you. A special MedGuide will be given to you by the pharmacist with each prescription and refill. Be sure to read this information carefully each time. Talk to your care team about the use of this medication in children. This medication is not approved for use in children. Overdosage: If you think you have taken too much of this medicine contact a poison control center or emergency room at once. NOTE: This medicine is only for you. Do not share this medicine with others. What if I miss a dose? If you miss a dose, take it as soon as you can. If it is almost time for your next dose, take only that dose. Do not take double or extra doses. What may interact with this medication? Alcohol Insulin  Other medications used to help people quit smoking Theophylline Warfarin This list may not describe all possible interactions. Give your health care provider a list of all the medicines, herbs, non-prescription drugs, or dietary supplements you use. Also tell them if you smoke, drink alcohol, or use illegal drugs. Some items may interact with your medicine. What should I watch for while using this medication? It is okay if you do not succeed at your attempt to quit and have a cigarette. You can still continue your quit attempt and keep using this medication as directed. Just throw away your cigarettes and get back to your quit plan. Talk to your care team  before using other treatments to quit smoking. Using this medication with other treatments to quit smoking may increase the risk for side effects compared to using a treatment alone. This medication may affect your coordination, reaction time, or judgment. Do not drive or operate machinery until you know how this medication affects you. Sit up or stand slowly to reduce the risk of dizzy or fainting  spells. Decrease the number of alcoholic beverages that you drink during treatment with this medication until you know if this medication affects your ability to tolerate alcohol. Some people have experienced increased drunkenness (intoxication), unusual or sometimes aggressive behavior, or no memory of things that have happened (amnesia) during treatment with this medication. You may do unusual sleep behaviors or activities you do not remember the day after taking this medication. Activities include driving, making or eating food, talking on the phone, sexual activity, or sleep walking. Stop taking this medication and call your care team right away if you find out you have done activities like this. Patients and their families should watch out for new or worsening depression or thoughts of suicide. Also watch out for sudden changes in feelings such as feeling anxious, agitated, panicky, irritable, hostile, aggressive, impulsive, severely restless, overly excited and hyperactive, or not being able to sleep. If this happens, call your care team. If you have diabetes, and you quit smoking, the effects of insulin  may be increased. You may need to reduce your insulin  dose. Check with your care team about how you should adjust your insulin  dose. What side effects may I notice from receiving this medication? Side effects that you should report to your care team as soon as possible: Allergic reactions or angioedema--skin rash, itching or hives, swelling of the face, eyes, lips, tongue, arms, or legs, trouble swallowing or breathing Heart attack--pain or tightness in the chest, shoulders, arms, or jaw, nausea, shortness of breath, cold or clammy skin, feeling faint or lightheaded Mood and behavior changes--anxiety, nervousness, confusion, hallucinations, irritability, hostility, thoughts of suicide or self-harm, worsening mood, feelings of depression Redness, blistering, peeling, or loosening of the skin,  including inside the mouth Stroke--sudden numbness or weakness of the face, arm, or leg, trouble speaking, confusion, trouble walking, loss of balance or coordination, dizziness, severe headache, change in vision Seizures Side effects that usually do not require medical attention (report to your care team if they continue or are bothersome): Constipation Drowsiness Gas Nausea Trouble sleeping Upset stomach Vivid dreams or nightmares Vomiting This list may not describe all possible side effects. Call your doctor for medical advice about side effects. You may report side effects to FDA at 1-800-FDA-1088. Where should I keep my medication? Keep out of the reach of children and pets. Store at room temperature between 15 and 30 degrees C (59 and 86 degrees F). Throw away any unused medication after the expiration date. NOTE: This sheet is a summary. It may not cover all possible information. If you have questions about this medicine, talk to your doctor, pharmacist, or health care provider.  2024 Elsevier/Gold Standard (2021-10-06 00:00:00)Trigger Finger  Trigger finger, also called stenosing tenosynovitis, is a condition that causes a finger or thumb to get stuck in a bent position. Each finger has tough, cord-like tissue (tendon) that connects muscle to bone, and each tendon passes through a tunnel of tissue (tendon sheath). The tendon sheaths are held close to the bone by a pulley. There is a pulley called the A1 pulley that is involved in the triggering of  a finger or thumb. To move your finger, your tendon needs to glide freely through the sheath. Trigger finger happens when the tendon or the sheath thickens, making it difficult to bend or straighten your finger as the thickened tendon gets stuck in the A1 pulley. Trigger finger can affect any of the fingers or the thumbs. Mild cases may clear up with rest and medicine. Severe cases require more treatment. What are the causes? Trigger finger  or thumb is caused by a thickened finger tendon or tendon sheath. The cause of this thickening is not known. What increases the risk? The following factors may make you more likely to develop this condition: Doing the same movements many times (repetitive activity) that require a strong grip. Having certain health conditions. These include rheumatoid arthritis, gout, carpal tunnel syndrome, or diabetes. Being 23-63 years old. Being male. What are the signs or symptoms? Symptoms of this condition include: Pain when bending or straightening your finger. Tenderness, swelling, or a lump in the palm of your hand just below the finger joint. Hearing a noise like a pop or a snap when you try to straighten your finger. Feeling a catch or locked feeling when you try to straighten your finger. Being unable to straighten your finger without help from your other hand. How is this diagnosed? This condition is diagnosed based on your symptoms and a physical exam. How is this treated? This condition may be treated by: Resting your finger and avoiding activities that make symptoms worse. Wearing a finger splint to keep your finger extended. Taking NSAIDs, such as ibuprofen , to relieve pain and swelling around the tendon. Doing gentle exercises to stretch the finger as told by your health care provider. Having medicine that reduces swelling and inflammation (steroids) injected into the tendon sheath. Injections may need to be repeated. Trigger finger release. This surgery is done to open the pulley. This may be done if other treatments do not work and you cannot straighten or bend your finger. You may need hand therapy after surgery. Follow these instructions at home: If you have a removable splint: Wear the splint as told by your provider. Remove it only as told by your provider. Check the skin around the splint every day. Tell your provider about any concerns. Loosen the splint if your fingers tingle,  become numb, or turn cold and blue. Keep the splint clean and dry. If the splint is not waterproof: Do not let it get wet. Cover it with a watertight covering when you take a bath or shower. Managing pain, stiffness, and swelling     If told, put ice on the painful area. If you have a removable splint, remove it as told by your health care provider. Put ice in a plastic bag. Place a towel between your skin and the bag or between your splint and the bag. Leave the ice on for 20 minutes, 2-3 times a day. If told, apply heat to the affected area as often as told by your provider. Use the heat source that your provider recommends, such as a moist heat pack or a heating pad. Place a towel between your skin and the heat source. Leave the heat on for 20-30 minutes. If your skin turns bright red, remove the ice or heat right away to prevent skin damage. The risk of damage is higher if you cannot feel pain, heat, or cold. Activity Rest your finger as told by your provider. Avoid activities that make the pain worse. Return to  your normal activities as told by your provider. Ask your provider what activities are safe for you. Do exercises as told by your provider. Ask your provider when it is safe to drive if you have a splint on your hand. General instructions Take over-the-counter and prescription medicines only as told by your provider. Keep all follow-up visits. These are needed to see how you are progressing. Contact a health care provider if: Your symptoms are not improving with home care. This information is not intended to replace advice given to you by your health care provider. Make sure you discuss any questions you have with your health care provider. Document Revised: 06/30/2022 Document Reviewed: 06/30/2022 Elsevier Patient Education  2024 [employer].

## 2024-06-17 ENCOUNTER — Other Ambulatory Visit (HOSPITAL_COMMUNITY): Payer: Self-pay

## 2024-06-17 ENCOUNTER — Telehealth: Payer: Self-pay | Admitting: *Deleted

## 2024-06-17 NOTE — Progress Notes (Unsigned)
 Paramedicine Encounter    Patient ID: [name], male    DOB: [birth year], 63 y.o.   MRN: [id number]   Patient Care Team: Oley Bascom RAMAN, NP as PCP - General (Pulmonary Disease) Shlomo Wilbert SAUNDERS, MD as PCP - Cardiology (Cardiology) [name] Ezra RAMAN, MD as PCP - Advanced Heart Failure (Cardiology) Cindie Ole DASEN, MD as PCP - Electrophysiology (Cardiology) Cathern Andriette DEL, LCSW as Social Worker (Licensed Clinical Social Worker) Eda Iha, MD (Inactive) as Consulting Physician (Gastroenterology)  Patient Active Problem List   Diagnosis Date Noted   PVC's (premature ventricular contractions) 09/28/2023   Acute right-sided low back pain with right-sided sciatica 05/14/2023   Multiple joint pain 12/22/2022   Obesity (BMI 35.0-39.9 without comorbidity) 07/18/2022   Cellulitis 06/22/2022   Cellulitis, leg 06/20/2022   Presence of Watchman left atrial appendage closure device 10/28/2021   Atrial fibrillation (HCC) 10/27/2021   Melena    Chronic diastolic CHF (congestive heart failure) (HCC)    GI bleed 06/29/2021   Severe anemia 06/29/2021   Adenomatous polyp of ascending colon    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   Acute blood loss anemia    Angiodysplasia of stomach    Chronic anticoagulation    CHF exacerbation (HCC) 06/15/2020   AKI (acute kidney injury) (HCC) 06/15/2020   CKD (chronic kidney disease), stage III (HCC) 06/15/2020   Anemia due to chronic blood loss    Gastric AVM    Angiodysplasia of duodenum with hemorrhage    Chronic systolic CHF (congestive heart failure) (HCC) 04/05/2020   Anemia 04/05/2020   PAF (paroxysmal atrial fibrillation) (HCC) 04/05/2020   OSA (obstructive sleep apnea) 04/05/2020   Syncope and collapse 04/05/2020   Type 2 diabetes mellitus with hyperglycemia, with long-term current use of insulin  (HCC) 12/03/2019   Diabetic polyneuropathy associated with type 2 diabetes mellitus (HCC) 12/03/2019    Erectile dysfunction 12/03/2019   Paranoid schizophrenia, chronic condition (HCC) 01/26/2015   Severe recurrent major depressive disorder with psychotic features (HCC) 01/26/2015   GAD (generalized anxiety disorder) 01/26/2015   OCD (obsessive compulsive disorder) 01/26/2015   Panic disorder without agoraphobia 01/26/2015   Insomnia 01/26/2015    Current Outpatient Medications:    acetaminophen  (TYLENOL ) 500 MG tablet, Take 2 tablets (1,000 mg total) by mouth 2 (two) times daily as needed., Disp: 180 tablet, Rfl: 3   albuterol  (VENTOLIN  HFA) 108 (90 Base) MCG/ACT inhaler, Inhale 2 puffs into the lungs every 6 (six) hours as needed for wheezing or shortness of breath., Disp: 18 g, Rfl: 1   aspirin  EC 81 MG tablet, Take 81 mg by mouth daily. Swallow whole., Disp: , Rfl:    atorvastatin  (LIPITOR) 40 MG tablet, Take 1 tablet (40 mg total) by mouth daily., Disp: 90 tablet, Rfl: 1   blood glucose meter kit and supplies KIT, Dispense based on patient and insurance preference. Use up to four times daily as directed. (FOR ICD-9 250.00, 250.01)., Disp: 1 each, Rfl: 0   busPIRone  (BUSPAR ) 10 MG tablet, Take 1 tablet (10 mg total) by mouth 2 (two) times daily., Disp: 60 tablet, Rfl: 0   colchicine  0.6 MG tablet, Daily for 7 days then daily PRN for gout flare, Disp: 30 tablet, Rfl: 0   Continuous Glucose Receiver (FREESTYLE LIBRE 3 READER) DEVI, 1 each by Does not apply route daily. Use to check glucose continuously, Disp: 1 each, Rfl: 1   Continuous Glucose Sensor (FREESTYLE LIBRE 3 SENSOR) MISC, PLACE 1 SENSOR ON THE SKIN EVERY  14 DAYS TO CHECK GLUCOSE CONTINUOSLY, Disp: 6 each, Rfl: 0   diclofenac  Sodium (VOLTAREN ) 1 % GEL, Apply 2 g topically daily as needed., Disp: 150 g, Rfl: 2   Diclofenac  Sodium 3 % GEL, Apply 1 Application topically daily as needed., Disp: 100 g, Rfl: 2   DULoxetine  (CYMBALTA ) 30 MG capsule, Take 1 capsule by mouth once daily (Patient not taking: Reported on 06/10/2024), Disp: 30  capsule, Rfl: 2   empagliflozin  (JARDIANCE ) 10 MG TABS tablet, Take 1 tablet (10 mg total) by mouth daily., Disp: 90 tablet, Rfl: 3   escitalopram  (LEXAPRO ) 10 MG tablet, Take 10 mg by mouth daily. Prescribed by Dr. JONELLE. Dimkpa (Patient not taking: Reported on 06/10/2024), Disp: , Rfl:    ferrous sulfate  325 (65 FE) MG tablet, Take 1 tablet (325 mg total) by mouth every other day., Disp: 30 tablet, Rfl: 1   furosemide  (LASIX ) 40 MG tablet, Take 1 tablet (40 mg total) by mouth 2 (two) times daily., Disp: 90 tablet, Rfl: 0   gabapentin  (NEURONTIN ) 800 MG tablet, TAKE 1 TABLET BY MOUTH THREE TIMES DAILY, Disp: 90 tablet, Rfl: 0   HUMALOG  MIX 75/25 KWIKPEN (75-25) 100 UNIT/ML KwikPen, DIAL 30 UNITS AND INJECT UNDER THE SKIN TWICE DAILY. MAX DAILY DOSE: 60 UNITS., Disp: 60 mL, Rfl: 0   metFORMIN  (GLUCOPHAGE -XR) 500 MG 24 hr tablet, Take 1 tablet (500 mg total) by mouth 2 (two) times daily with a meal., Disp: 60 tablet, Rfl: 0   metoprolol  succinate (TOPROL -XL) 50 MG 24 hr tablet, Take 1.5 tablets (75 mg total) by mouth daily., Disp: 45 tablet, Rfl: 11   Multiple Vitamins-Minerals (CENTRUM SILVER 50+MEN) TABS, Take 1 tablet by mouth daily., Disp: , Rfl:    nicotine  (NICODERM CQ  - DOSED IN MG/24 HOURS) 14 mg/24hr patch, Place 1 patch (14 mg total) onto the skin daily. (Patient not taking: Reported on 05/28/2024), Disp: 28 patch, Rfl: 0   nicotine  (NICODERM CQ  - DOSED IN MG/24 HOURS) 21 mg/24hr patch, Place 1 patch (21 mg total) onto the skin daily. (Patient not taking: Reported on 05/28/2024), Disp: 28 patch, Rfl: 0   nicotine  (NICODERM CQ  - DOSED IN MG/24 HR) 7 mg/24hr patch, Place 1 patch (7 mg total) onto the skin daily. (Patient not taking: Reported on 05/28/2024), Disp: 28 patch, Rfl: 0   Omega-3 Fatty Acids (FISH OIL) 1000 MG CAPS, Take 1,000 mg by mouth at bedtime., Disp: , Rfl:    pantoprazole  (PROTONIX ) 40 MG tablet, Take 1 tablet (40 mg total) by mouth daily., Disp: 90 tablet, Rfl: 0   potassium chloride  SA  (KLOR-CON  M) 20 MEQ tablet, Take 2 tablets (40 mEq total) by mouth daily., Disp: 60 tablet, Rfl: 6   QUEtiapine  (SEROQUEL ) 200 MG tablet, Take 200 mg by mouth at bedtime., Disp: , Rfl:    RELION PEN NEEDLES 31G X 6 MM MISC, INJECT 1 PEN INTO THE SKIN 2 TIMES DAILY, Disp: 100 each, Rfl: 0   sacubitril -valsartan  (ENTRESTO ) 24-26 MG, Take 1 tablet by mouth 2 (two) times daily., Disp: 180 tablet, Rfl: 1   Semaglutide , 2 MG/DOSE, (OZEMPIC , 2 MG/DOSE,) 8 MG/3ML SOPN, Inject 2 mg into the skin once a week. Monday, Disp: 3 mL, Rfl: 2   sildenafil  (VIAGRA ) 100 MG tablet, TAKE 1/2 TO 1 (ONE-HALF TO ONE) TABLET BY MOUTH ONCE DAILY AS NEEDED FOR ERECTILE DYSFUNCTION (Patient not taking: Reported on 06/04/2024), Disp: 5 tablet, Rfl: 0   spironolactone  (ALDACTONE ) 25 MG tablet, Take 1 tablet (25 mg  total) by mouth daily., Disp: 90 tablet, Rfl: 3   tadalafil  (CIALIS ) 20 MG tablet, Take 20 mg by mouth daily as needed. (Patient not taking: Reported on 06/04/2024), Disp: , Rfl:    Varenicline  Tartrate, Starter, (CHANTIX  STARTING MONTH PAK) 0.5 MG X 11 & 1 MG X 42 TBPK, Take as instructed on packaging, Disp: 1 each, Rfl: 0   Vitamin D , Ergocalciferol , (DRISDOL ) 1.25 MG (50000 UNIT) CAPS capsule, Take 1 capsule by mouth once a week (Patient not taking: Reported on 06/04/2024), Disp: 5 capsule, Rfl: 0   zinc gluconate 50 MG tablet, Take 50 mg by mouth daily. (Patient not taking: Reported on 06/04/2024), Disp: , Rfl:  No Known Allergies   Social History   Socioeconomic History   Marital status: Legally Separated    Spouse name: Not on file   Number of children: Not on file   Years of education: Not on file   Highest education level: Not on file  Occupational History   Not on file  Tobacco Use   Smoking status: Never    Passive exposure: Never   Smokeless tobacco: Never  Vaping Use   Vaping status: Never Used  Substance and Sexual Activity   Alcohol use: Not Currently    Alcohol/week: 2.0 standard drinks of alcohol     Types: 2 Cans of beer per week   Drug use: No   Sexual activity: Yes    Birth control/protection: None  Other Topics Concern   Not on file  Social History Narrative   Not on file   Social Drivers of Health   Financial Resource Strain: Medium Risk (05/24/2023)   Overall Financial Resource Strain (CARDIA)    Difficulty of Paying Living Expenses: Somewhat hard  Food Insecurity: No Food Insecurity (02/04/2024)   Hunger Vital Sign    Worried About Running Out of Food in the Last Year: Never true    Ran Out of Food in the Last Year: Never true  Transportation Needs: No Transportation Needs (05/24/2023)   PRAPARE - Administrator, Civil Service (Medical): No    Lack of Transportation (Non-Medical): No  Physical Activity: Sufficiently Active (05/24/2023)   Exercise Vital Sign    Days of Exercise per Week: 7 days    Minutes of Exercise per Session: 30 min  Stress: No Stress Concern Present (05/24/2023)   [employer] of Occupational Health - Occupational Stress Questionnaire    Feeling of Stress : Not at all  Social Connections: Moderately Isolated (05/24/2023)   Social Connection and Isolation Panel    Frequency of Communication with Friends and Family: More than three times a week    Frequency of Social Gatherings with Friends and Family: More than three times a week    Attends Religious Services: Never    Database administrator or Organizations: No    Attends Banker Meetings: Never    Marital Status: Living with partner  Intimate Partner Violence: Not At Risk (05/24/2023)   Humiliation, Afraid, Rape, and Kick questionnaire    Fear of Current or Ex-Partner: No    Emotionally Abused: No    Physically Abused: No    Sexually Abused: No    Physical Exam      Future Appointments  Date Time Provider Department Center  09/05/2024  7:40 AM [facility] DEVICE REMOTES [facility]  09/17/2024  2:00 PM Oley Bascom RAMAN, NP [facility] None      ACTION: {Paramed [phone]}

## 2024-06-17 NOTE — Progress Notes (Signed)
 Care Guide Pharmacy Note  06/17/2024 Name: [name] MRN: [id number] DOB: [birth year]  Referred By: Oley Bascom RAMAN, NP Reason for referral: Complex Care Management (Outreach to schedule referral with pharmacist )   [name] is a 63 y.o. year old male who is a primary care patient of Oley Bascom RAMAN, NP.  [name] was referred to the pharmacist for assistance related to: DMII  Successful contact was made with the patient to discuss pharmacy services including being ready for the pharmacist to call at least 5 minutes before the scheduled appointment time and to have medication bottles and any blood pressure readings ready for review. The patient agreed to meet with the pharmacist via telephone visit on 07/22/2024  Thedford Franks, CMA [place]  [facility], [facility] Guide Direct Dial: [phone]  Fax: [phone] Website: [email or web address]

## 2024-06-19 ENCOUNTER — Ambulatory Visit: Payer: Self-pay | Admitting: Nurse Practitioner

## 2024-06-20 ENCOUNTER — Telehealth (HOSPITAL_COMMUNITY): Payer: Self-pay

## 2024-06-20 ENCOUNTER — Other Ambulatory Visit: Payer: Self-pay | Admitting: Nurse Practitioner

## 2024-06-20 DIAGNOSIS — E1165 Type 2 diabetes mellitus with hyperglycemia: Secondary | ICD-10-CM

## 2024-06-20 DIAGNOSIS — G6289 Other specified polyneuropathies: Secondary | ICD-10-CM

## 2024-06-20 MED ORDER — GABAPENTIN 800 MG PO TABS: 800.0000 mg | ORAL_TABLET | Freq: Three times a day (TID) | ORAL | 0 refills | Status: DC

## 2024-06-20 NOTE — Telephone Encounter (Signed)
 [facility] is needing RX for Gabapentin  for [name].  Unsure if he is receiving this from pain mgt or PCP- will forward to both.    [name], EMT-Paramedic [phone] 06/20/2024

## 2024-06-23 ENCOUNTER — Other Ambulatory Visit (HOSPITAL_COMMUNITY): Payer: Self-pay

## 2024-06-23 NOTE — Progress Notes (Signed)
 Paramedicine Encounter    Patient ID: [name], male    DOB: 12/21/1960, 63 y.o.   MRN: [id number]   Complaints- left finger pain associated with trigger finger   Assessment- CAOX4, warm and dry reporting to be feeling okay other than having pain from his trigger finger in which he was referred to hand surgeon by PCP. He has not followed up and reports they have not reached out. I will follow up. Lungs clear. No edema. Vitals baseline.   Compliance with meds- missed one night dose   Pill box filled- for two weeks   Refills needed-  Potassium Metoprolol  Metformin  Lasix    Meds changes since last visit- started chantix       Social changes- none    VISIT SUMMARY- Arrived for home visit for [name] who reports to be feeling okay but having pain from trigger finger. He is taking tylenol  for same at present. He was supposedly referred to a hand surgeon but has not heard from them. I will follow up. I obtained vitals and assessment. Lungs clear, no edema. CBG- 169  I reviewed meds and confirmed same. Pill box filled for two weeks. [facility] He has no gabapentin  on hand at home- [facility] today or tomorrow. HF and DM education provided. I plan to see [name] in two weeks.   BP 110/70   Pulse 83   Resp 16   Wt 295 lb (133.8 kg)   SpO2 90%   BMI 36.87 kg/m  Weight yesterday-- didn't weigh  Last visit weight-- 294lbs      ACTION: Home visit completed     Patient Care Team: Oley Bascom RAMAN, NP as PCP - General (Pulmonary Disease) Shlomo Wilbert SAUNDERS, MD as PCP - Cardiology (Cardiology) [name] Ezra RAMAN, MD as PCP - Advanced Heart Failure (Cardiology) Cindie Ole DASEN, MD as PCP - Electrophysiology (Cardiology) Cathern Andriette DEL, LCSW as Social Worker (Licensed Clinical Social Worker) Eda Iha, MD (Inactive) as Consulting Physician (Gastroenterology)  Patient Active Problem List   Diagnosis Date Noted   PVC's  (premature ventricular contractions) 09/28/2023   Acute right-sided low back pain with right-sided sciatica 05/14/2023   Multiple joint pain 12/22/2022   Obesity (BMI 35.0-39.9 without comorbidity) 07/18/2022   Cellulitis 06/22/2022   Cellulitis, leg 06/20/2022   Presence of Watchman left atrial appendage closure device 10/28/2021   Atrial fibrillation (HCC) 10/27/2021   Melena    Chronic diastolic CHF (congestive heart failure) (HCC)    GI bleed 06/29/2021   Severe anemia 06/29/2021   Adenomatous polyp of ascending colon    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   Acute blood loss anemia    Angiodysplasia of stomach    Chronic anticoagulation    CHF exacerbation (HCC) 06/15/2020   AKI (acute kidney injury) (HCC) 06/15/2020   CKD (chronic kidney disease), stage III (HCC) 06/15/2020   Anemia due to chronic blood loss    Gastric AVM    Angiodysplasia of duodenum with hemorrhage    Chronic systolic CHF (congestive heart failure) (HCC) 04/05/2020   Anemia 04/05/2020   PAF (paroxysmal atrial fibrillation) (HCC) 04/05/2020   OSA (obstructive sleep apnea) 04/05/2020   Syncope and collapse 04/05/2020   Type 2 diabetes mellitus with hyperglycemia, with long-term current use of insulin  (HCC) 12/03/2019   Diabetic polyneuropathy associated with type 2 diabetes mellitus (HCC) 12/03/2019   Erectile dysfunction 12/03/2019   Paranoid schizophrenia, chronic condition (HCC) 01/26/2015   Severe  recurrent major depressive disorder with psychotic features (HCC) 01/26/2015   GAD (generalized anxiety disorder) 01/26/2015   OCD (obsessive compulsive disorder) 01/26/2015   Panic disorder without agoraphobia 01/26/2015   Insomnia 01/26/2015    Current Outpatient Medications:    acetaminophen  (TYLENOL ) 500 MG tablet, Take 2 tablets (1,000 mg total) by mouth 2 (two) times daily as needed. (Patient taking differently: Take 1,000 mg by mouth 2 (two) times daily.), Disp: 180  tablet, Rfl: 3   albuterol  (VENTOLIN  HFA) 108 (90 Base) MCG/ACT inhaler, Inhale 2 puffs into the lungs every 6 (six) hours as needed for wheezing or shortness of breath., Disp: 18 g, Rfl: 1   aspirin  EC 81 MG tablet, Take 81 mg by mouth daily. Swallow whole., Disp: , Rfl:    atorvastatin  (LIPITOR) 40 MG tablet, Take 1 tablet (40 mg total) by mouth daily., Disp: 90 tablet, Rfl: 1   blood glucose meter kit and supplies KIT, Dispense based on patient and insurance preference. Use up to four times daily as directed. (FOR ICD-9 250.00, 250.01)., Disp: 1 each, Rfl: 0   busPIRone  (BUSPAR ) 10 MG tablet, Take 1 tablet (10 mg total) by mouth 2 (two) times daily., Disp: 60 tablet, Rfl: 0   colchicine  0.6 MG tablet, Daily for 7 days then daily PRN for gout flare, Disp: 30 tablet, Rfl: 0   Continuous Glucose Receiver (FREESTYLE LIBRE 3 READER) DEVI, 1 each by Does not apply route daily. Use to check glucose continuously, Disp: 1 each, Rfl: 1   Continuous Glucose Sensor (FREESTYLE LIBRE 3 SENSOR) MISC, PLACE 1 SENSOR ON THE SKIN EVERY 14 DAYS TO CHECK GLUCOSE CONTINUOSLY, Disp: 6 each, Rfl: 0   diclofenac  Sodium (VOLTAREN ) 1 % GEL, Apply 2 g topically daily as needed., Disp: 150 g, Rfl: 2   Diclofenac  Sodium 3 % GEL, Apply 1 Application topically daily as needed., Disp: 100 g, Rfl: 2   empagliflozin  (JARDIANCE ) 10 MG TABS tablet, Take 1 tablet (10 mg total) by mouth daily., Disp: 90 tablet, Rfl: 3   ferrous sulfate  325 (65 FE) MG tablet, Take 1 tablet (325 mg total) by mouth every other day., Disp: 30 tablet, Rfl: 1   furosemide  (LASIX ) 40 MG tablet, Take 1 tablet (40 mg total) by mouth 2 (two) times daily., Disp: 90 tablet, Rfl: 0   gabapentin  (NEURONTIN ) 800 MG tablet, Take 1 tablet (800 mg total) by mouth 3 (three) times daily., Disp: 90 tablet, Rfl: 0   HUMALOG  MIX 75/25 KWIKPEN (75-25) 100 UNIT/ML KwikPen, DIAL 30 UNITS AND INJECT UNDER THE SKIN TWICE DAILY. MAX DAILY DOSE: 60 UNITS., Disp: 60 mL, Rfl: 0    metFORMIN  (GLUCOPHAGE -XR) 500 MG 24 hr tablet, Take 1 tablet (500 mg total) by mouth 2 (two) times daily with a meal., Disp: 60 tablet, Rfl: 0   metoprolol  succinate (TOPROL -XL) 50 MG 24 hr tablet, Take 1.5 tablets (75 mg total) by mouth daily., Disp: 45 tablet, Rfl: 11   Multiple Vitamins-Minerals (CENTRUM SILVER 50+MEN) TABS, Take 1 tablet by mouth daily., Disp: , Rfl:    Omega-3 Fatty Acids (FISH OIL) 1000 MG CAPS, Take 1,000 mg by mouth at bedtime., Disp: , Rfl:    pantoprazole  (PROTONIX ) 40 MG tablet, Take 1 tablet (40 mg total) by mouth daily., Disp: 90 tablet, Rfl: 0   potassium chloride  SA (KLOR-CON  M) 20 MEQ tablet, Take 2 tablets (40 mEq total) by mouth daily., Disp: 60 tablet, Rfl: 6   QUEtiapine  (SEROQUEL ) 200 MG tablet, Take 200 mg by  mouth at bedtime., Disp: , Rfl:    RELION PEN NEEDLES 31G X 6 MM MISC, INJECT 1 PEN INTO THE SKIN 2 TIMES DAILY, Disp: 100 each, Rfl: 0   sacubitril -valsartan  (ENTRESTO ) 24-26 MG, Take 1 tablet by mouth 2 (two) times daily., Disp: 180 tablet, Rfl: 1   Semaglutide , 2 MG/DOSE, (OZEMPIC , 2 MG/DOSE,) 8 MG/3ML SOPN, Inject 2 mg into the skin once a week. Monday, Disp: 3 mL, Rfl: 2   spironolactone  (ALDACTONE ) 25 MG tablet, Take 1 tablet (25 mg total) by mouth daily., Disp: 90 tablet, Rfl: 3   Varenicline  Tartrate, Starter, (CHANTIX  STARTING MONTH PAK) 0.5 MG X 11 & 1 MG X 42 TBPK, Take as instructed on packaging, Disp: 1 each, Rfl: 0   DULoxetine  (CYMBALTA ) 30 MG capsule, Take 1 capsule by mouth once daily (Patient not taking: Reported on 06/23/2024), Disp: 30 capsule, Rfl: 2   escitalopram  (LEXAPRO ) 10 MG tablet, Take 10 mg by mouth daily. Prescribed by Dr. JONELLE. Dimkpa (Patient not taking: Reported on 06/23/2024), Disp: , Rfl:    nicotine  (NICODERM CQ  - DOSED IN MG/24 HOURS) 14 mg/24hr patch, Place 1 patch (14 mg total) onto the skin daily. (Patient not taking: Reported on 06/23/2024), Disp: 28 patch, Rfl: 0   nicotine  (NICODERM CQ  - DOSED IN MG/24 HOURS) 21 mg/24hr  patch, Place 1 patch (21 mg total) onto the skin daily. (Patient not taking: Reported on 06/23/2024), Disp: 28 patch, Rfl: 0   nicotine  (NICODERM CQ  - DOSED IN MG/24 HR) 7 mg/24hr patch, Place 1 patch (7 mg total) onto the skin daily. (Patient not taking: Reported on 06/23/2024), Disp: 28 patch, Rfl: 0   sildenafil  (VIAGRA ) 100 MG tablet, TAKE 1/2 TO 1 (ONE-HALF TO ONE) TABLET BY MOUTH ONCE DAILY AS NEEDED FOR ERECTILE DYSFUNCTION (Patient not taking: Reported on 06/23/2024), Disp: 5 tablet, Rfl: 0   tadalafil  (CIALIS ) 20 MG tablet, Take 20 mg by mouth daily as needed. (Patient not taking: Reported on 06/18/2024), Disp: , Rfl:    Vitamin D , Ergocalciferol , (DRISDOL ) 1.25 MG (50000 UNIT) CAPS capsule, Take 1 capsule by mouth once a week (Patient not taking: Reported on 06/23/2024), Disp: 5 capsule, Rfl: 0   zinc gluconate 50 MG tablet, Take 50 mg by mouth daily. (Patient not taking: Reported on 06/23/2024), Disp: , Rfl:  No Known Allergies   Social History   Socioeconomic History   Marital status: Legally Separated    Spouse name: Not on file   Number of children: Not on file   Years of education: Not on file   Highest education level: Not on file  Occupational History   Not on file  Tobacco Use   Smoking status: Never    Passive exposure: Never   Smokeless tobacco: Never  Vaping Use   Vaping status: Never Used  Substance and Sexual Activity   Alcohol use: Not Currently    Alcohol/week: 2.0 standard drinks of alcohol    Types: 2 Cans of beer per week   Drug use: No   Sexual activity: Yes    Birth control/protection: None  Other Topics Concern   Not on file  Social History Narrative   Not on file   Social Drivers of Health   Financial Resource Strain: Medium Risk (05/24/2023)   Overall Financial Resource Strain (CARDIA)    Difficulty of Paying Living Expenses: Somewhat hard  Food Insecurity: No Food Insecurity (02/04/2024)   Hunger Vital Sign    Worried About Running Out of Food in  the Last Year: Never true    Ran Out of Food in the Last Year: Never true  Transportation Needs: No Transportation Needs (05/24/2023)   PRAPARE - Administrator, Civil Service (Medical): No    Lack of Transportation (Non-Medical): No  Physical Activity: Sufficiently Active (05/24/2023)   Exercise Vital Sign    Days of Exercise per Week: 7 days    Minutes of Exercise per Session: 30 min  Stress: No Stress Concern Present (05/24/2023)   [employer] of Occupational Health - Occupational Stress Questionnaire    Feeling of Stress : Not at all  Social Connections: Moderately Isolated (05/24/2023)   Social Connection and Isolation Panel    Frequency of Communication with Friends and Family: More than three times a week    Frequency of Social Gatherings with Friends and Family: More than three times a week    Attends Religious Services: Never    Database administrator or Organizations: No    Attends Banker Meetings: Never    Marital Status: Living with partner  Intimate Partner Violence: Not At Risk (05/24/2023)   Humiliation, Afraid, Rape, and Kick questionnaire    Fear of Current or Ex-Partner: No    Emotionally Abused: No    Physically Abused: No    Sexually Abused: No    Physical Exam      Future Appointments  Date Time Provider Department Center  07/22/2024  1:30 PM [facility] PHARMACIST [facility] None  09/05/2024  7:40 AM [facility] DEVICE REMOTES [facility]  09/17/2024  2:00 PM Oley Bascom RAMAN, NP [facility] None

## 2024-07-01 ENCOUNTER — Telehealth (HOSPITAL_COMMUNITY): Payer: Self-pay

## 2024-07-01 NOTE — Telephone Encounter (Signed)
 Mr. Royse advised [facility] has not received his Gabapentin . I will send a message to PCP to sent refill to [facility].  [name], EMT-Paramedic [phone] 07/01/2024

## 2024-07-02 ENCOUNTER — Telehealth: Payer: Self-pay | Admitting: Pharmacy Technician

## 2024-07-02 NOTE — Telephone Encounter (Signed)
 Ozempic  2mg  approved through 11/26/24 per [facility]  [id number] tracking number of shipment per automated system  I spoke to someone at [facility] and they said that tracking number was from the previous order and the due shipment had not processed for unknown reason per rep. They are going to process this as of today and they said to allow 10-14 business days to arrive to provider office. Provider address confirmed-[address]   I called the patient and he said he has 2-3 boxes so should have enough until the shipment gets here.

## 2024-07-03 ENCOUNTER — Other Ambulatory Visit: Payer: Self-pay | Admitting: Nurse Practitioner

## 2024-07-07 ENCOUNTER — Other Ambulatory Visit: Payer: Self-pay | Admitting: Nurse Practitioner

## 2024-07-07 ENCOUNTER — Telehealth (HOSPITAL_COMMUNITY): Payer: Self-pay

## 2024-07-07 DIAGNOSIS — E1165 Type 2 diabetes mellitus with hyperglycemia: Secondary | ICD-10-CM

## 2024-07-07 DIAGNOSIS — Z794 Long term (current) use of insulin: Secondary | ICD-10-CM

## 2024-07-07 MED ORDER — METFORMIN HCL ER 500 MG PO TB24: 500.0000 mg | ORAL_TABLET | Freq: Two times a day (BID) | ORAL | 0 refills | Status: DC

## 2024-07-07 NOTE — Telephone Encounter (Signed)
 [facility] needs refills for Metformin  sent to them to fill for delivery.   [name], EMT-Paramedic [phone] 07/07/2024

## 2024-07-08 ENCOUNTER — Other Ambulatory Visit (HOSPITAL_COMMUNITY): Payer: Self-pay

## 2024-07-08 NOTE — Progress Notes (Signed)
 Paramedicine Encounter    Patient ID: [name], male    DOB: 1961-07-21, 63 y.o.   MRN: [id number]   Complaints- trigger finger pain  Assessment- CAOX4, warm and dry ambulatory without shortness of breath, no edema, no CP, no dizziness. Lungs clear. CBG- 80 (had not eaten lunch) Vitals within normal limits.   Compliance with meds- missed several noon doses of fish oil and gabapentin , missed three night doses, two morning doses   Pill box filled- for two weeks   Refills needed- metformin  and Jardiance    Meds changes since last visit- started taking his wife's etodolac  800mg  and tizanidine  4mg  and says this helped with trigger finger- I advised him to not take these meds as they are not prescribed to him without his doctors approval- he said his wife would send PCP a message.    Social changes- comes off probation in two months.    VISIT SUMMARY- Arrived for home visit for [name] who reports to be feeling well with no complaints other than his trigger finger pain. He has been using his wife's etodolac  and tizanidine  and I informed him the dangers of taking meds not prescribed to him. He said he understood but says they help so he wants to keep taking them. I reviewed meds and filled pill box for two weeks. I will call in refills. Assessment and vitals as noted. I reviewed appointments and helped him set up appointments with his psychiatrist and therapist. I wrote down all upcoming appointments. Home visit complete. I will see [name] in two weeks.   BP (!) 130/90   Pulse (!) 57   Resp 16   Wt 284 lb (128.8 kg)   SpO2 96%   BMI 35.50 kg/m  Weight yesterday-- 285lbs  Last visit weight-- 295lbs      ACTION: Home visit completed     Patient Care Team: Oley Bascom RAMAN, NP as PCP - General (Pulmonary Disease) Shlomo Wilbert SAUNDERS, MD as PCP - Cardiology (Cardiology) [name] Ezra RAMAN, MD as PCP - Advanced Heart Failure (Cardiology) Cindie Ole DASEN, MD as PCP -  Electrophysiology (Cardiology) Cathern Andriette DEL, LCSW as Social Worker (Licensed Clinical Social Worker) Eda Iha, MD (Inactive) as Consulting Physician (Gastroenterology)  Patient Active Problem List   Diagnosis Date Noted   PVC's (premature ventricular contractions) 09/28/2023   Acute right-sided low back pain with right-sided sciatica 05/14/2023   Multiple joint pain 12/22/2022   Obesity (BMI 35.0-39.9 without comorbidity) 07/18/2022   Cellulitis 06/22/2022   Cellulitis, leg 06/20/2022   Presence of Watchman left atrial appendage closure device 10/28/2021   Atrial fibrillation (HCC) 10/27/2021   Melena    Chronic diastolic CHF (congestive heart failure) (HCC)    GI bleed 06/29/2021   Severe anemia 06/29/2021   Adenomatous polyp of ascending colon    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   Acute blood loss anemia    Angiodysplasia of stomach    Chronic anticoagulation    CHF exacerbation (HCC) 06/15/2020   AKI (acute kidney injury) (HCC) 06/15/2020   CKD (chronic kidney disease), stage III (HCC) 06/15/2020   Anemia due to chronic blood loss    Gastric AVM    Angiodysplasia of duodenum with hemorrhage    Chronic systolic CHF (congestive heart failure) (HCC) 04/05/2020   Anemia 04/05/2020   PAF (paroxysmal atrial fibrillation) (HCC) 04/05/2020   OSA (obstructive sleep apnea) 04/05/2020   Syncope and collapse 04/05/2020   Type 2 diabetes mellitus with hyperglycemia, with long-term current  use of insulin  (HCC) 12/03/2019   Diabetic polyneuropathy associated with type 2 diabetes mellitus (HCC) 12/03/2019   Erectile dysfunction 12/03/2019   Paranoid schizophrenia, chronic condition (HCC) 01/26/2015   Severe recurrent major depressive disorder with psychotic features (HCC) 01/26/2015   GAD (generalized anxiety disorder) 01/26/2015   OCD (obsessive compulsive disorder) 01/26/2015   Panic disorder without agoraphobia 01/26/2015   Insomnia 01/26/2015     Current Outpatient Medications:    acetaminophen  (TYLENOL ) 500 MG tablet, Take 2 tablets (1,000 mg total) by mouth 2 (two) times daily as needed. (Patient taking differently: Take 1,000 mg by mouth 2 (two) times daily.), Disp: 180 tablet, Rfl: 3   albuterol  (VENTOLIN  HFA) 108 (90 Base) MCG/ACT inhaler, Inhale 2 puffs into the lungs every 6 (six) hours as needed for wheezing or shortness of breath., Disp: 18 g, Rfl: 1   aspirin  EC 81 MG tablet, Take 81 mg by mouth daily. Swallow whole., Disp: , Rfl:    atorvastatin  (LIPITOR) 40 MG tablet, Take 1 tablet (40 mg total) by mouth daily., Disp: 90 tablet, Rfl: 1   blood glucose meter kit and supplies KIT, Dispense based on patient and insurance preference. Use up to four times daily as directed. (FOR ICD-9 250.00, 250.01)., Disp: 1 each, Rfl: 0   busPIRone  (BUSPAR ) 10 MG tablet, Take 1 tablet (10 mg total) by mouth 2 (two) times daily., Disp: 60 tablet, Rfl: 0   colchicine  0.6 MG tablet, Daily for 7 days then daily PRN for gout flare, Disp: 30 tablet, Rfl: 0   Continuous Glucose Receiver (FREESTYLE LIBRE 3 READER) DEVI, 1 each by Does not apply route daily. Use to check glucose continuously, Disp: 1 each, Rfl: 1   Continuous Glucose Sensor (FREESTYLE LIBRE 3 SENSOR) MISC, PLACE 1 SENSOR ON THE SKIN EVERY 14 DAYS TO CHECK GLUCOSE CONTINUOSLY, Disp: 6 each, Rfl: 0   diclofenac  Sodium (VOLTAREN ) 1 % GEL, Apply 2 g topically daily as needed., Disp: 150 g, Rfl: 2   Diclofenac  Sodium 3 % GEL, Apply 1 Application topically daily as needed., Disp: 100 g, Rfl: 2   DULoxetine  (CYMBALTA ) 30 MG capsule, Take 1 capsule by mouth once daily (Patient not taking: Reported on 06/23/2024), Disp: 30 capsule, Rfl: 2   empagliflozin  (JARDIANCE ) 10 MG TABS tablet, Take 1 tablet (10 mg total) by mouth daily., Disp: 90 tablet, Rfl: 3   escitalopram  (LEXAPRO ) 10 MG tablet, Take 10 mg by mouth daily. Prescribed by Dr. JONELLE. Dimkpa (Patient not taking: Reported on 06/23/2024), Disp: ,  Rfl:    ferrous sulfate  325 (65 FE) MG tablet, Take 1 tablet (325 mg total) by mouth every other day., Disp: 30 tablet, Rfl: 1   furosemide  (LASIX ) 40 MG tablet, Take 1 tablet (40 mg total) by mouth 2 (two) times daily., Disp: 90 tablet, Rfl: 0   gabapentin  (NEURONTIN ) 800 MG tablet, Take 1 tablet (800 mg total) by mouth 3 (three) times daily., Disp: 90 tablet, Rfl: 0   HUMALOG  MIX 75/25 KWIKPEN (75-25) 100 UNIT/ML KwikPen, DIAL 30 UNITS AND INJECT UNDER THE SKIN TWICE DAILY. MAX DAILY DOSE: 60 UNITS., Disp: 60 mL, Rfl: 0   metFORMIN  (GLUCOPHAGE -XR) 500 MG 24 hr tablet, Take 1 tablet (500 mg total) by mouth 2 (two) times daily with a meal., Disp: 60 tablet, Rfl: 0   metoprolol  succinate (TOPROL -XL) 50 MG 24 hr tablet, Take 1.5 tablets (75 mg total) by mouth daily., Disp: 45 tablet, Rfl: 11   Multiple Vitamins-Minerals (CENTRUM SILVER 50+MEN) TABS, Take  1 tablet by mouth daily., Disp: , Rfl:    nicotine  (NICODERM CQ  - DOSED IN MG/24 HOURS) 14 mg/24hr patch, Place 1 patch (14 mg total) onto the skin daily. (Patient not taking: Reported on 06/23/2024), Disp: 28 patch, Rfl: 0   nicotine  (NICODERM CQ  - DOSED IN MG/24 HOURS) 21 mg/24hr patch, Place 1 patch (21 mg total) onto the skin daily. (Patient not taking: Reported on 06/23/2024), Disp: 28 patch, Rfl: 0   nicotine  (NICODERM CQ  - DOSED IN MG/24 HR) 7 mg/24hr patch, Place 1 patch (7 mg total) onto the skin daily. (Patient not taking: Reported on 06/23/2024), Disp: 28 patch, Rfl: 0   Omega-3 Fatty Acids (FISH OIL) 1000 MG CAPS, Take 1,000 mg by mouth at bedtime., Disp: , Rfl:    pantoprazole  (PROTONIX ) 40 MG tablet, Take 1 tablet (40 mg total) by mouth daily., Disp: 90 tablet, Rfl: 0   potassium chloride  SA (KLOR-CON  M) 20 MEQ tablet, Take 2 tablets (40 mEq total) by mouth daily., Disp: 60 tablet, Rfl: 6   QUEtiapine  (SEROQUEL ) 200 MG tablet, Take 200 mg by mouth at bedtime., Disp: , Rfl:    RELION PEN NEEDLES 31G X 6 MM MISC, INJECT 1 PEN INTO THE SKIN 2  TIMES DAILY, Disp: 100 each, Rfl: 0   sacubitril -valsartan  (ENTRESTO ) 24-26 MG, Take 1 tablet by mouth 2 (two) times daily., Disp: 180 tablet, Rfl: 1   Semaglutide , 2 MG/DOSE, (OZEMPIC , 2 MG/DOSE,) 8 MG/3ML SOPN, Inject 2 mg into the skin once a week. Monday, Disp: 3 mL, Rfl: 2   sildenafil  (VIAGRA ) 100 MG tablet, TAKE 1/2 TO 1 (ONE-HALF TO ONE) TABLET BY MOUTH ONCE DAILY AS NEEDED FOR ERECTILE DYSFUNCTION (Patient not taking: Reported on 06/23/2024), Disp: 5 tablet, Rfl: 0   spironolactone  (ALDACTONE ) 25 MG tablet, Take 1 tablet (25 mg total) by mouth daily., Disp: 90 tablet, Rfl: 3   tadalafil  (CIALIS ) 20 MG tablet, Take 20 mg by mouth daily as needed. (Patient not taking: Reported on 06/18/2024), Disp: , Rfl:    Varenicline  Tartrate, Starter, (CHANTIX  STARTING MONTH PAK) 0.5 MG X 11 & 1 MG X 42 TBPK, Take as instructed on packaging, Disp: 1 each, Rfl: 0   Vitamin D , Ergocalciferol , (DRISDOL ) 1.25 MG (50000 UNIT) CAPS capsule, Take 1 capsule by mouth once a week (Patient not taking: Reported on 06/23/2024), Disp: 5 capsule, Rfl: 0   zinc gluconate 50 MG tablet, Take 50 mg by mouth daily. (Patient not taking: Reported on 06/23/2024), Disp: , Rfl:  No Known Allergies   Social History   Socioeconomic History   Marital status: Legally Separated    Spouse name: Not on file   Number of children: Not on file   Years of education: Not on file   Highest education level: Not on file  Occupational History   Not on file  Tobacco Use   Smoking status: Never    Passive exposure: Never   Smokeless tobacco: Never  Vaping Use   Vaping status: Never Used  Substance and Sexual Activity   Alcohol use: Not Currently    Alcohol/week: 2.0 standard drinks of alcohol    Types: 2 Cans of beer per week   Drug use: No   Sexual activity: Yes    Birth control/protection: None  Other Topics Concern   Not on file  Social History Narrative   Not on file   Social Drivers of Health   Financial Resource Strain:  Medium Risk (05/24/2023)   Overall Physicist, medical Strain (  CARDIA)    Difficulty of Paying Living Expenses: Somewhat hard  Food Insecurity: No Food Insecurity (02/04/2024)   Hunger Vital Sign    Worried About Running Out of Food in the Last Year: Never true    Ran Out of Food in the Last Year: Never true  Transportation Needs: No Transportation Needs (05/24/2023)   PRAPARE - Administrator, Civil Service (Medical): No    Lack of Transportation (Non-Medical): No  Physical Activity: Sufficiently Active (05/24/2023)   Exercise Vital Sign    Days of Exercise per Week: 7 days    Minutes of Exercise per Session: 30 min  Stress: No Stress Concern Present (05/24/2023)   [employer] of Occupational Health - Occupational Stress Questionnaire    Feeling of Stress : Not at all  Social Connections: Moderately Isolated (05/24/2023)   Social Connection and Isolation Panel    Frequency of Communication with Friends and Family: More than three times a week    Frequency of Social Gatherings with Friends and Family: More than three times a week    Attends Religious Services: Never    Database administrator or Organizations: No    Attends Banker Meetings: Never    Marital Status: Living with partner  Intimate Partner Violence: Not At Risk (05/24/2023)   Humiliation, Afraid, Rape, and Kick questionnaire    Fear of Current or Ex-Partner: No    Emotionally Abused: No    Physically Abused: No    Sexually Abused: No    Physical Exam      Future Appointments  Date Time Provider Department Center  07/22/2024  1:30 PM [facility] PHARMACIST [facility] None  09/05/2024  7:40 AM [facility] HVT DEVICE REMOTES [facility] H[facility]V  09/17/2024  2:00 PM Oley Bascom RAMAN, NP [facility] None

## 2024-07-14 ENCOUNTER — Other Ambulatory Visit: Payer: Self-pay | Admitting: Nurse Practitioner

## 2024-07-14 MED ORDER — TIZANIDINE HCL 4 MG PO TABS: 4.0000 mg | ORAL_TABLET | Freq: Three times a day (TID) | ORAL | 1 refills | Status: AC

## 2024-07-14 MED ORDER — ETODOLAC 400 MG PO TABS: 400.0000 mg | ORAL_TABLET | Freq: Two times a day (BID) | ORAL | 3 refills | Status: DC

## 2024-07-16 ENCOUNTER — Other Ambulatory Visit: Payer: Self-pay | Admitting: Nurse Practitioner

## 2024-07-21 ENCOUNTER — Telehealth (HOSPITAL_COMMUNITY): Payer: Self-pay

## 2024-07-21 NOTE — Telephone Encounter (Signed)
 Ozempic  arrived, patient notified to pick up at [facility] office

## 2024-07-21 NOTE — Telephone Encounter (Signed)
 Attempted to contact [name] for paramedicine visit- no answer. I left a message. I will continue to follow up.    [name], EMT-Paramedic [phone] 07/21/2024

## 2024-07-22 ENCOUNTER — Other Ambulatory Visit (HOSPITAL_COMMUNITY): Payer: Self-pay

## 2024-07-22 ENCOUNTER — Other Ambulatory Visit (INDEPENDENT_AMBULATORY_CARE_PROVIDER_SITE_OTHER): Payer: Self-pay

## 2024-07-22 DIAGNOSIS — F419 Anxiety disorder, unspecified: Secondary | ICD-10-CM

## 2024-07-22 DIAGNOSIS — E1165 Type 2 diabetes mellitus with hyperglycemia: Secondary | ICD-10-CM

## 2024-07-22 DIAGNOSIS — R7309 Other abnormal glucose: Secondary | ICD-10-CM

## 2024-07-22 MED ORDER — INSULIN LISPRO PROT & LISPRO (75-25 MIX) 100 UNIT/ML KWIKPEN: 30.0000 [IU] | PEN_INJECTOR | Freq: Two times a day (BID) | SUBCUTANEOUS | 3 refills | Status: DC

## 2024-07-22 MED ORDER — METFORMIN HCL ER 500 MG PO TB24
500.0000 mg | ORAL_TABLET | Freq: Two times a day (BID) | ORAL | 3 refills | Status: DC
Start: 2024-07-22 — End: 2024-09-17

## 2024-07-22 MED ORDER — FREESTYLE LIBRE 3 PLUS SENSOR MISC: 3 refills | Status: AC

## 2024-07-22 NOTE — Progress Notes (Unsigned)
 07/22/2024 Name: [name] MRN: [id number] DOB: 29-May-1961  Chief Complaint  Patient presents with   Diabetes    [name] is a 63 y.o. year old male who presented for a telephone visit.   They were referred to the pharmacist by their PCP for assistance in managing diabetes. PMH includes HFpEF (EF 45-50% in Feb 2025) with ICD in place, Afib s/p Watchman, PVCs, GI bleed, T2DM, CKD3, schizophrenia, anxiety, OCS,     Subjective: Patient was last seen by PCP, Bascom Borer, NP, on 06/16/24. At last visit, BP was close to goal at 130/84 mmHg, HR 74 bpm. A1C increased from 6.9% to 8.1%. Patient endorsed difficulty adhering to healthy food choices. He reported that he was motivated to quit smoking and was initiated on varenicline .   Today, patient reports doing well. He is wearing a FL3 sensor and uses the reader to monitor. He reports that he has made a lot of diet changes since his last A1C was elevated and his numbers have improved. He reports that he needs refills for several of his medications. He reports that the etodolac  and tizanidine  are not helping his pain (trigger finger). He has not scheduled anything appointment with the hand center yet.    Care Team: Primary Care Provider: Borer Bascom RAMAN, NP ; Next Scheduled Visit: 09/17/24   Medication Access/Adherence  Current Pharmacy:  [id number] - [place], [place] - [address] [place] [address] Phone: [phone] Fax: [phone]  [facility] - [place], [place] - [address] [place] [address] Phone: [phone] Fax: [phone]  [facility] - [place], [place] - [address] [place] [address] Phone: [phone] Fax: [phone]   Patient reports affordability concerns with their medications: No  Patient reports access/transportation concerns to their  pharmacy: No  Patient reports adherence concerns with their medications:  No  - has paramedicine come to house for med organization and review [name])   Diabetes:  Current medications: Jardiance  10 mg daily,  metformin  XR 500 mg BID with meals (last filled 05/27/24), Ozempic  2 mg once weekly (Mondays, states he needs to pick up his shipment from [facility] soon), Humalog  Mix 75/25 30 units BID with meals  Current glucose readings:  Using Freestyle Libre 3 meter - using reader; does not have reader with him today so we are unable to review averages. Reports BG are rarely > 200 mg/dL. Recalls AM readings of 96, 100 mg/dL. Denies BG <70 mg/dL.   Patient denies hypoglycemic s/sx including dizziness, shakiness, sweating. Patient denies hyperglycemic symptoms including polyuria, polydipsia, polyphagia, nocturia, neuropathy, blurred vision.  Current meal patterns: did not discuss in depth today  Current physical activity: did not discuss today  Current medication access support:  - [employer] PAP for Ozempic  (expires Dec 2025) - [employer] for Humalog  Mix 75/25 - patient reports this is mailed to him, but unable to find approval letter/dates. Will collaborate with CPhT, Burnard Lot, to investigate.   Heart Failure (EF 45-50% in Feb 2025):  Current medications:  ACEi/ARB/ARNI: Entresto  24-26 mg BID SGLT2i: Jardiance  10 mg daily Beta blocker: metoprolol  succinate 75 mg (1.5x 50 mg tabs) daily Mineralocorticoid Receptor Antagonist: spironolactone  25 mg daily Diuretic regimen: furosemide  40 mg BID + potassium 20 mEq BID  Patient is managed by paramedicine   Tobacco Abuse:   Quit Attempt History: Most recent quit attempt: current Patient tried  Varenicline  but reports he was not able to tolerate due to nausea and he stopped taking it a couple weeks ago. Reports he had nausea even if he took his doses with food.  Reports that the patches are not effective for him Reports he has not  been able to cut back on smoking yet.    Objective:  BP Readings from Last 3 Encounters:  07/08/24 (!) 130/90  06/23/24 110/70  06/16/24 130/84    Lab Results  Component Value Date   HGBA1C 8.1 (A) 06/16/2024   HGBA1C 6.9 (A) 02/04/2024   HGBA1C 7.0 08/23/2023       Latest Ref Rng & Units 02/04/2024   10:37 AM 01/01/2024    2:24 PM 09/28/2023    3:48 PM  BMP  Glucose 70 - 99 mg/dL 95  70  875   BUN 8 - 27 mg/dL 10  8  17    Creatinine 0.76 - 1.27 mg/dL 9.07  9.09  8.83   BUN/Creat Ratio 10 - 24 11     Sodium 134 - 144 mmol/L 143  140  138   Potassium 3.5 - 5.2 mmol/L 3.6  3.5  3.8   Chloride 96 - 106 mmol/L 103  106  104   CO2 20 - 29 mmol/L 25  25  24    Calcium  8.6 - 10.2 mg/dL 9.1  8.9  9.0     Lab Results  Component Value Date   CHOL 118 02/04/2024   HDL 45 02/04/2024   LDLCALC 55 02/04/2024   TRIG 93 02/04/2024   CHOLHDL 2.6 02/04/2024    Medications Reviewed Today     Reviewed by Brinda Lorain SQUIBB, RPH (Pharmacist) on 07/22/24 at 1723  Med List Status: <None>   Medication Order Taking? Sig Documenting Provider Last Dose Status Informant  acetaminophen  (TYLENOL ) 500 MG tablet [id number]  Take 2 tablets (1,000 mg total) by mouth 2 (two) times daily as needed.  Patient taking differently: Take 1,000 mg by mouth 2 (two) times daily.   Rosario Leatrice FERNS, MD  Active Self, Pharmacy Records           Med Note [facility], [name] L   Mon Jun 23, 2024 11:51 AM)    albuterol  (VENTOLIN  HFA) 108 [phone] Base) MCG/ACT inhaler [id number]  Inhale 2 puffs into the lungs every 6 (six) hours as needed for wheezing or shortness of breath. Myrna Camelia HERO, NP  Active Self, Pharmacy Records           Med Note [facility], [name] L   Tue Mar 06, 2023  4:53 PM)    aspirin  EC 81 MG tablet [id number] Yes Take 81 mg by mouth daily. Swallow whole. [provider]  Active            Med Note MICHAIL, [name] L   Wed May 28, 2024  4:27 PM)    atorvastatin  (LIPITOR) 40 MG tablet [id number] Yes  Take 1 tablet (40 mg total) by mouth daily. Oley Bascom RAMAN, NP  Active   blood glucose meter kit and supplies KIT [id number]  Dispense based on patient and insurance preference. Use up to four times daily as directed. (FOR ICD-9 250.00, 250.01). Raenelle Coria, MD  Active Self, Pharmacy Records  busPIRone  (BUSPAR ) 10 MG tablet [id number] Yes Take 1 tablet (10 mg total) by mouth 2 (two) times daily. Oley Bascom RAMAN, NP  Active   colchicine  0.6 MG tablet [id number] Yes Daily for 7 days then daily PRN for gout flare  Oley Bascom RAMAN, NP  Active   Continuous Glucose Receiver (FREESTYLE LIBRE 3 READER) DEVI [id number] Yes 1 each by Does not apply route daily. Use to check glucose continuously Oley Bascom RAMAN, NP  Active    Discontinued 07/22/24 1720 (Change in therapy)   diclofenac  Sodium (VOLTAREN ) 1 % GEL [id number]  Apply 2 g topically daily as needed. Oley Bascom RAMAN, NP  Active   Diclofenac  Sodium 3 % GEL [id number]  Apply 1 Application topically daily as needed. Oley Bascom RAMAN, NP  Active    Patient not taking:   Discontinued 07/22/24 1336 (Change in therapy)            Med Note (SPENCER, [name] L   Tue Jan 15, 2024  8:11 AM)    empagliflozin  (JARDIANCE ) 10 MG TABS tablet [id number] Yes Take 1 tablet (10 mg total) by mouth daily. Oley Bascom RAMAN, NP  Active    Patient not taking:   Discontinued 07/22/24 1717 (Patient has not taken in last 30 days)          Med Note (SPENCER, [name] L   Mon May 05, 2024  3:07 PM) Has been without same - psychiatrist has been notified.   etodolac  (LODINE ) 400 MG tablet [id number] Yes Take 1 tablet (400 mg total) by mouth 2 (two) times daily. Oley Bascom RAMAN, NP  Active   ferrous sulfate  325 (65 FE) MG tablet [id number]  Take 1 tablet (325 mg total) by mouth every other day. Myrna Camelia HERO, NP  Expired 06/23/24 2359 Self, Pharmacy Records           Med Note JOY, [facility]   Tue Sep 11, 2023  4:00 PM)    furosemide  (LASIX ) 40 MG tablet [id number] Yes Take 1 tablet  (40 mg total) by mouth 2 (two) times daily. Oley Bascom RAMAN, NP  Active   gabapentin  (NEURONTIN ) 800 MG tablet [id number] Yes Take 1 tablet (800 mg total) by mouth 3 (three) times daily. Oley Bascom RAMAN, NP  Active   HUMALOG  MIX 75/25 KWIKPEN (75-25) 100 UNIT/ML KwikPen [id number] Yes DIAL 30 UNITS AND INJECT UNDER THE SKIN TWICE DAILY. MAX DAILY DOSE: 60 UNITS. Oley Bascom RAMAN, NP  Active   metFORMIN  (GLUCOPHAGE -XR) 500 MG 24 hr tablet [id number] Yes Take 1 tablet (500 mg total) by mouth 2 (two) times daily with a meal. Oley Bascom RAMAN, NP  Active   metoprolol  succinate (TOPROL -XL) 50 MG 24 hr tablet [id number] Yes Take 1.5 tablets (75 mg total) by mouth daily. Oley Bascom RAMAN, NP  Active   Multiple Vitamins-Minerals (CENTRUM SILVER 50+MEN) TABS [id number]  Take 1 tablet by mouth daily. [provider]  Active Self, Pharmacy Records   Patient not taking:   Discontinued 07/22/24 1717 (Ineffective)    Patient not taking:   Discontinued 07/22/24 1717 (Ineffective)    Patient not taking:   Discontinued 07/22/24 1717 (Ineffective)   Omega-3 Fatty Acids (FISH OIL) 1000 MG CAPS [id number]  Take 1,000 mg by mouth at bedtime. [provider]  Active Self  pantoprazole  (PROTONIX ) 40 MG tablet [id number] Yes Take 1 tablet by mouth once daily Nichols, Tonya S, NP  Active   potassium chloride  SA (KLOR-CON  M) 20 MEQ tablet [id number] Yes Take 2 tablets (40 mEq total) by mouth daily. Oley Bascom RAMAN, NP  Active   QUEtiapine  (SEROQUEL ) 200 MG tablet [id number] Yes Take 200 mg by mouth at bedtime. [provider]  Active  Med Note (SPENCER, [name] L   Mon May 05, 2024  3:08 PM) Takes out of bottle not in pill box.  RELION PEN NEEDLES 31G X 6 MM MISC [id number]  INJECT 1 PEN INTO THE SKIN 2 TIMES DAILY Nichols, Tonya S, NP  Active   sacubitril -valsartan  (ENTRESTO ) 24-26 MG [id number] Yes Take 1 tablet by mouth 2 (two) times daily. Oley Bascom RAMAN, NP  Active   Semaglutide , 2 MG/DOSE,  (OZEMPIC , 2 MG/DOSE,) 8 MG/3ML SOPN [id number] Yes Inject 2 mg into the skin once a week. Monday Oley Bascom RAMAN, NP  Active   sildenafil  (VIAGRA ) 100 MG tablet [id number]  TAKE 1/2 TO 1 (ONE-HALF TO ONE) TABLET BY MOUTH ONCE DAILY AS NEEDED FOR ERECTILE DYSFUNCTION  Patient not taking: Reported on 06/23/2024   Oley Bascom RAMAN, NP  Active   spironolactone  (ALDACTONE ) 25 MG tablet [id number] Yes Take 1 tablet (25 mg total) by mouth daily. Oley Bascom RAMAN, NP  Active    Patient not taking:   Discontinued 07/22/24 1718 (Patient has not taken in last 30 days)   tiZANidine  (ZANAFLEX ) 4 MG tablet [id number] Yes Take 1 tablet (4 mg total) by mouth 3 (three) times daily. Oley Bascom RAMAN, NP  Active   Varenicline  Tartrate, Starter, (CHANTIX  STARTING MONTH PAK) 0.5 MG X 11 & 1 MG X 42 TBPK [id number]  Take as instructed on packaging  Patient not taking: Reported on 07/22/2024   Oley Bascom RAMAN, NP  Active    Patient not taking:   Discontinued 07/22/24 1339    Patient not taking:   Discontinued 07/22/24 1340 (Patient Preference) Med List Note Maria, [place], NP 04/01/21 1345):                Assessment/Plan:   Diabetes: - Currently uncontrolled with most recent A1C of 8.1% above goal <7% and worsened from 6.9% previously - which patient feels was due to dietary indiscretion. Patient is on maximum dose Ozempic  and is tolerating well. Would like to review BG prior to making changes, especially given uncertainty of when patient last received a shipment of Humalog  Mix 75/25. There is room for increasing his doses of metformin  and/or Jardiance  if additional pharmacotherapy is needed.  - Last UACR 49 mg/g 02/04/24 - Patient denies personal or family history of multiple endocrine neoplasia type 2, medullary thyroid  cancer; personal history of pancreatitis or gallbladder disease. - Reviewed long term cardiovascular and renal outcomes of uncontrolled blood sugar - Reviewed goal A1c, goal fasting, and goal 2  hour post prandial glucose - Reviewed hypoglycemia management plan and the rule of 15 - Reviewed dietary modifications including  utilizing the healthy plate method, limiting portion size of carbohydrate foods, increasing intake of protein and non-starchy vegetables. Counseled patient to stay hydrated with water throughout the day. - Recommend to continue Ozempic  2 mg weekly, Jardiance  10 mg daily, Humalog  75/25 Mix 20 units BID with meals - Provided patient with refills for maintenance medications and CGM. Educated to bring his reader device to all his appointments.  - Next A1C due Oct 2025     Heart Failure: - Currently appropriately managed - Educated that etodolac  is not an appropriate long term medication to control his pain given HF, CKD, and ASCVD history. Advised him to stop taking this NSAID if he does not feel he is getting any benefit. Will discuss alternative treatment with PCP. Also encouraged patient to schedule appt with [facility] to discuss procedures for treating his pain.  - Reviewed  appropriate blood pressure monitoring technique and reviewed goal blood pressure - Reviewed to weigh daily and when to contact cardiology with weight gain - Recommend to continue current regimen    Tobacco Abuse - Currently uncontrolled - Provided motivational interviewing to assess tobacco use and strategies for reduction - Provided information on 1 800 QUIT NOW support program - Discussed methods for improving varenicline  tolerability. Advised him that he could try taking 0.5 mg with food once daily or once every other day to see if this curbed his cravings.   Assisted in access to care by providing patient with phone numbers for [facility]. Also advised him to call paramedic, [name], who was unable to reach him yesterday.    Patient verbalized understanding of treatment plan.    Follow Up Plan:  Pharmacist in person 08/26/24 PCP clinic visit  09/17/24   Lorain Baseman, PharmD [facility] [phone]

## 2024-07-23 ENCOUNTER — Other Ambulatory Visit: Payer: Self-pay

## 2024-07-23 MED ORDER — BUSPIRONE HCL 10 MG PO TABS: 10.0000 mg | ORAL_TABLET | Freq: Two times a day (BID) | ORAL | 0 refills | Status: DC

## 2024-07-24 ENCOUNTER — Other Ambulatory Visit (HOSPITAL_COMMUNITY): Payer: Self-pay

## 2024-07-24 NOTE — Progress Notes (Signed)
 Paramedicine Encounter    Patient ID: [name], male    DOB: Jan 31, 1961, 63 y.o.   MRN: [id number]   Complaints- hand pain, stress and anxiety, weight gain, smoking cigarettes again    Assessment- CAOX4, warm and dry in his home reporting to be having hand pain, stress and feeling overwhelmed with life. I talked with him at length about discussing this with his psychiatrist. He agreed. He reports smoking cigarettes again. I stressed to him the importance of not smoking. He says he understood.   Compliance with meds-two noon doses - missing his insulin  and ozempic  doses often   Pill box filled- one week   Refills needed- gabapentin    Meds changes since last visit- none     Social changes- none    VISIT SUMMARY- Arrived for home visit for [name] who reports to be feeling stressed and overwhelmed with life. We talked at length and offered him suggestions and advice to talk to his therapist. He agreed. He is smoking again- I advised him to try to stop and he said he is going to try. I obtained vitals and assessment. Weight gain of 8 lbs since two weeks ago- he admits to eating junk and drinking soda- we talked about his HF and DM management and how he has to get his A1C lowered in order to be healthy and be a candidate for the insipire. He agreed. Meds reviewed and pill box filled for one week. Refills as noted. He asked for appointments with urology for ED and a hand specialist for his trigger finger. I will assist. Home visit complete. I will see [name] in one week.      BP 130/70   Pulse 95   Resp 18   Wt 292 lb (132.5 kg)   SpO2 98%   BMI 36.50 kg/m  Weight yesterday-- didn weigh  Last visit weight-- 284lbs   CBG- 130    ACTION: Home visit completed     Patient Care Team: Oley Bascom RAMAN, NP as PCP - General (Pulmonary Disease) Shlomo Wilbert SAUNDERS, MD as PCP - Cardiology (Cardiology) [name] Ezra RAMAN, MD as PCP - Advanced Heart Failure (Cardiology) Cindie Ole DASEN, MD as PCP - Electrophysiology (Cardiology) Cathern Andriette DEL, LCSW as Social Worker (Licensed Clinical Social Worker) Eda Iha, MD (Inactive) as Consulting Physician (Gastroenterology) Brinda Lorain SQUIBB, [facility] (Pharmacist)  Patient Active Problem List   Diagnosis Date Noted   PVC's (premature ventricular contractions) 09/28/2023   Acute right-sided low back pain with right-sided sciatica 05/14/2023   Multiple joint pain 12/22/2022   Obesity (BMI 35.0-39.9 without comorbidity) 07/18/2022   Cellulitis 06/22/2022   Cellulitis, leg 06/20/2022   Presence of Watchman left atrial appendage closure device 10/28/2021   Atrial fibrillation (HCC) 10/27/2021   Melena    Chronic diastolic CHF (congestive heart failure) (HCC)    GI bleed 06/29/2021   Severe anemia 06/29/2021   Adenomatous polyp of ascending colon    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   Acute blood loss anemia    Angiodysplasia of stomach    Chronic anticoagulation    CHF exacerbation (HCC) 06/15/2020   AKI (acute kidney injury) (HCC) 06/15/2020   CKD (chronic kidney disease), stage III (HCC) 06/15/2020   Anemia due to chronic blood loss    Gastric AVM    Angiodysplasia of duodenum with hemorrhage    Chronic systolic CHF (congestive heart failure) (HCC) 04/05/2020   Anemia 04/05/2020   PAF (paroxysmal atrial fibrillation) (HCC) 04/05/2020  OSA (obstructive sleep apnea) 04/05/2020   Syncope and collapse 04/05/2020   Type 2 diabetes mellitus with hyperglycemia, with long-term current use of insulin  (HCC) 12/03/2019   Diabetic polyneuropathy associated with type 2 diabetes mellitus (HCC) 12/03/2019   Erectile dysfunction 12/03/2019   Paranoid schizophrenia, chronic condition (HCC) 01/26/2015   Severe recurrent major depressive disorder with psychotic features (HCC) 01/26/2015   GAD (generalized anxiety disorder) 01/26/2015   OCD (obsessive compulsive disorder) 01/26/2015   Panic  disorder without agoraphobia 01/26/2015   Insomnia 01/26/2015    Current Outpatient Medications:    acetaminophen  (TYLENOL ) 500 MG tablet, Take 2 tablets (1,000 mg total) by mouth 2 (two) times daily as needed., Disp: 180 tablet, Rfl: 3   albuterol  (VENTOLIN  HFA) 108 (90 Base) MCG/ACT inhaler, Inhale 2 puffs into the lungs every 6 (six) hours as needed for wheezing or shortness of breath., Disp: 18 g, Rfl: 1   aspirin  EC 81 MG tablet, Take 81 mg by mouth daily. Swallow whole., Disp: , Rfl:    atorvastatin  (LIPITOR) 40 MG tablet, Take 1 tablet (40 mg total) by mouth daily., Disp: 90 tablet, Rfl: 1   blood glucose meter kit and supplies KIT, Dispense based on patient and insurance preference. Use up to four times daily as directed. (FOR ICD-9 250.00, 250.01)., Disp: 1 each, Rfl: 0   busPIRone  (BUSPAR ) 10 MG tablet, Take 1 tablet (10 mg total) by mouth 2 (two) times daily., Disp: 180 tablet, Rfl: 0   colchicine  0.6 MG tablet, Daily for 7 days then daily PRN for gout flare, Disp: 30 tablet, Rfl: 0   Continuous Glucose Receiver (FREESTYLE LIBRE 3 READER) DEVI, 1 each by Does not apply route daily. Use to check glucose continuously, Disp: 1 each, Rfl: 1   Continuous Glucose Sensor (FREESTYLE LIBRE 3 PLUS SENSOR) MISC, Change sensor every 15 days., Disp: 6 each, Rfl: 3   diclofenac  Sodium (VOLTAREN ) 1 % GEL, Apply 2 g topically daily as needed., Disp: 150 g, Rfl: 2   Diclofenac  Sodium 3 % GEL, Apply 1 Application topically daily as needed., Disp: 100 g, Rfl: 2   empagliflozin  (JARDIANCE ) 10 MG TABS tablet, Take 1 tablet (10 mg total) by mouth daily., Disp: 90 tablet, Rfl: 3   etodolac  (LODINE ) 400 MG tablet, Take 1 tablet (400 mg total) by mouth 2 (two) times daily., Disp: 60 tablet, Rfl: 3   ferrous sulfate  325 (65 FE) MG tablet, Take 1 tablet (325 mg total) by mouth every other day., Disp: 30 tablet, Rfl: 1   furosemide  (LASIX ) 40 MG tablet, Take 1 tablet (40 mg total) by mouth 2 (two) times daily.,  Disp: 90 tablet, Rfl: 0   gabapentin  (NEURONTIN ) 800 MG tablet, Take 1 tablet (800 mg total) by mouth 3 (three) times daily., Disp: 90 tablet, Rfl: 0   Insulin  Lispro Prot & Lispro (HUMALOG  MIX 75/25 KWIKPEN) (75-25) 100 UNIT/ML Kwikpen, Inject 30 Units into the skin 2 (two) times daily., Disp: 60 mL, Rfl: 3   metFORMIN  (GLUCOPHAGE -XR) 500 MG 24 hr tablet, Take 1 tablet (500 mg total) by mouth 2 (two) times daily with a meal., Disp: 180 tablet, Rfl: 3   metoprolol  succinate (TOPROL -XL) 50 MG 24 hr tablet, Take 1.5 tablets (75 mg total) by mouth daily., Disp: 45 tablet, Rfl: 11   Multiple Vitamins-Minerals (CENTRUM SILVER 50+MEN) TABS, Take 1 tablet by mouth daily., Disp: , Rfl:    Omega-3 Fatty Acids (FISH OIL) 1000 MG CAPS, Take 1,000 mg by mouth at bedtime.,  Disp: , Rfl:    pantoprazole  (PROTONIX ) 40 MG tablet, Take 1 tablet by mouth once daily, Disp: 90 tablet, Rfl: 0   potassium chloride  SA (KLOR-CON  M) 20 MEQ tablet, Take 2 tablets (40 mEq total) by mouth daily., Disp: 60 tablet, Rfl: 6   QUEtiapine  (SEROQUEL ) 200 MG tablet, Take 200 mg by mouth at bedtime., Disp: , Rfl:    RELION PEN NEEDLES 31G X 6 MM MISC, INJECT 1 PEN INTO THE SKIN 2 TIMES DAILY, Disp: 100 each, Rfl: 0   sacubitril -valsartan  (ENTRESTO ) 24-26 MG, Take 1 tablet by mouth 2 (two) times daily., Disp: 180 tablet, Rfl: 1   Semaglutide , 2 MG/DOSE, (OZEMPIC , 2 MG/DOSE,) 8 MG/3ML SOPN, Inject 2 mg into the skin once a week. Monday, Disp: 3 mL, Rfl: 2   sildenafil  (VIAGRA ) 100 MG tablet, TAKE 1/2 TO 1 (ONE-HALF TO ONE) TABLET BY MOUTH ONCE DAILY AS NEEDED FOR ERECTILE DYSFUNCTION, Disp: 5 tablet, Rfl: 0   tiZANidine  (ZANAFLEX ) 4 MG tablet, Take 1 tablet (4 mg total) by mouth 3 (three) times daily., Disp: 90 tablet, Rfl: 1   spironolactone  (ALDACTONE ) 25 MG tablet, Take 1 tablet (25 mg total) by mouth daily. (Patient not taking: Reported on 07/24/2024), Disp: 90 tablet, Rfl: 3   Varenicline  Tartrate, Starter, (CHANTIX  STARTING MONTH PAK)  0.5 MG X 11 & 1 MG X 42 TBPK, Take as instructed on packaging (Patient not taking: Reported on 07/24/2024), Disp: 1 each, Rfl: 0 No Known Allergies   Social History   Socioeconomic History   Marital status: Legally Separated    Spouse name: Not on file   Number of children: Not on file   Years of education: Not on file   Highest education level: Not on file  Occupational History   Not on file  Tobacco Use   Smoking status: Never    Passive exposure: Never   Smokeless tobacco: Never  Vaping Use   Vaping status: Never Used  Substance and Sexual Activity   Alcohol use: Not Currently    Alcohol/week: 2.0 standard drinks of alcohol    Types: 2 Cans of beer per week   Drug use: No   Sexual activity: Yes    Birth control/protection: None  Other Topics Concern   Not on file  Social History Narrative   Not on file   Social Drivers of Health   Financial Resource Strain: Medium Risk (05/24/2023)   Overall Financial Resource Strain (CARDIA)    Difficulty of Paying Living Expenses: Somewhat hard  Food Insecurity: No Food Insecurity (02/04/2024)   Hunger Vital Sign    Worried About Running Out of Food in the Last Year: Never true    Ran Out of Food in the Last Year: Never true  Transportation Needs: No Transportation Needs (05/24/2023)   PRAPARE - Administrator, Civil Service (Medical): No    Lack of Transportation (Non-Medical): No  Physical Activity: Sufficiently Active (05/24/2023)   Exercise Vital Sign    Days of Exercise per Week: 7 days    Minutes of Exercise per Session: 30 min  Stress: No Stress Concern Present (05/24/2023)   [employer] of Occupational Health - Occupational Stress Questionnaire    Feeling of Stress : Not at all  Social Connections: Moderately Isolated (05/24/2023)   Social Connection and Isolation Panel    Frequency of Communication with Friends and Family: More than three times a week    Frequency of Social Gatherings with Friends and  Family: More  than three times a week    Attends Religious Services: Never    Active Member of Clubs or Organizations: No    Attends Banker Meetings: Never    Marital Status: Living with partner  Intimate Partner Violence: Not At Risk (05/24/2023)   Humiliation, Afraid, Rape, and Kick questionnaire    Fear of Current or Ex-Partner: No    Emotionally Abused: No    Physically Abused: No    Sexually Abused: No    Physical Exam      Future Appointments  Date Time Provider Department Center  08/26/2024  3:30 PM [facility] PHARMACIST [facility]  09/05/2024  7:40 AM [facility] HVT DEVICE REMOTES [facility]  09/17/2024  2:00 PM Oley Bascom RAMAN, NP [facility]

## 2024-07-30 ENCOUNTER — Other Ambulatory Visit (HOSPITAL_COMMUNITY): Payer: Self-pay

## 2024-07-30 ENCOUNTER — Telehealth (HOSPITAL_COMMUNITY): Payer: Self-pay

## 2024-07-30 NOTE — Telephone Encounter (Signed)
 Spoke to [facility] and they are needing an RX for Gabapentin  sent in for Mr. Pica.   Thanks.   [name], EMT-Paramedic [phone] 07/30/2024

## 2024-07-31 ENCOUNTER — Other Ambulatory Visit: Payer: Self-pay | Admitting: Nurse Practitioner

## 2024-07-31 DIAGNOSIS — G6289 Other specified polyneuropathies: Secondary | ICD-10-CM

## 2024-07-31 DIAGNOSIS — Z794 Long term (current) use of insulin: Secondary | ICD-10-CM

## 2024-07-31 MED ORDER — GABAPENTIN 800 MG PO TABS
800.0000 mg | ORAL_TABLET | Freq: Three times a day (TID) | ORAL | 0 refills | Status: DC
Start: 2024-07-31 — End: 2024-09-03

## 2024-08-05 ENCOUNTER — Other Ambulatory Visit (HOSPITAL_COMMUNITY): Payer: Self-pay

## 2024-08-05 DIAGNOSIS — E1165 Type 2 diabetes mellitus with hyperglycemia: Secondary | ICD-10-CM

## 2024-08-05 DIAGNOSIS — G6289 Other specified polyneuropathies: Secondary | ICD-10-CM

## 2024-08-05 NOTE — Telephone Encounter (Signed)
 [facility] advised they did not receive his Gabapentin  refill- it was sent to [facility]- it needs to be sent to [facility] so they can deliver for patient.    Routing to PCP for [facility] to [facility].    [name], EMT-Paramedic [phone] 08/05/2024

## 2024-08-05 NOTE — Progress Notes (Signed)
 Paramedicine Encounter    Patient ID: [name], male    DOB: 07/27/1961, 63 y.o.   MRN: [id number]   Complaints- cold with flu like symptoms for 3 days   Assessment- CAOX4, warm and diaphoretic reporting he has been trying to clean and cook breakfast before my arrival, he is not short of breath on exertion, sounds congested, lungs clear, suffering from flu like symptoms for three days taking OTC meds and a friends penicillin ( 5 pills total), no lower leg edema noted. Vitals obtained- BP elevated taking dayquil.   Compliance with meds- missed two night doses   Pill box filled- for two weeks   (Gabapentin  missing in #2 box)   Refills needed- gabapentin , multivitamin  -potassium, metoprolol , entresto , lasix    Meds changes since last visit- none     Social changes- Now off probation as of Sunday.    VISIT SUMMARY- Arrived for home visit for [name] who reports to be feeling poorly with flu like symptoms X3 days- he has been taking OTC Dayquil and Tylenol . He also admits he has been taking his friends penicillin (5 pills total) he says this has helped and the Dayquil and Tylenol  have helped. I obtained vitals and assessment as noted. I reviewed meds and filled pill box for two weeks. He struggles with his medicines due to not being able to read. I plan to get him on bubble packs soon. Refills called in. PCP sent Gabapentin  to wrong pharmacy- had to message her to send it to [facility]. I reviewed upcoming appointments and confirmed same. I plan to come out in two weeks. He agreed with plan.   BP (!) 160/82   Pulse 72   Resp 18   Wt 286 lb 6.4 oz (129.9 kg)   SpO2 98%   BMI 35.80 kg/m  Weight yesterday--didn't weigh  Last visit weight--292     ACTION: Home visit completed     Patient Care Team: Oley Bascom RAMAN, NP as PCP - General (Pulmonary Disease) Shlomo Wilbert SAUNDERS, MD as PCP - Cardiology (Cardiology) [name] Ezra RAMAN, MD as PCP - Advanced Heart Failure  (Cardiology) Cindie Ole DASEN, MD as PCP - Electrophysiology (Cardiology) Cathern Andriette DEL, LCSW as Social Worker (Licensed Clinical Social Worker) Eda Iha, MD (Inactive) as Consulting Physician (Gastroenterology) Brinda Lorain SQUIBB, [facility] (Pharmacist)  Patient Active Problem List   Diagnosis Date Noted   PVC's (premature ventricular contractions) 09/28/2023   Acute right-sided low back pain with right-sided sciatica 05/14/2023   Multiple joint pain 12/22/2022   Obesity (BMI 35.0-39.9 without comorbidity) 07/18/2022   Cellulitis 06/22/2022   Cellulitis, leg 06/20/2022   Presence of Watchman left atrial appendage closure device 10/28/2021   Atrial fibrillation (HCC) 10/27/2021   Melena    Chronic diastolic CHF (congestive heart failure) (HCC)    GI bleed 06/29/2021   Severe anemia 06/29/2021   Adenomatous polyp of ascending colon    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   Acute blood loss anemia    Angiodysplasia of stomach    Chronic anticoagulation    CHF exacerbation (HCC) 06/15/2020   AKI (acute kidney injury) (HCC) 06/15/2020   CKD (chronic kidney disease), stage III (HCC) 06/15/2020   Anemia due to chronic blood loss    Gastric AVM    Angiodysplasia of duodenum with hemorrhage    Chronic systolic CHF (congestive heart failure) (HCC) 04/05/2020   Anemia 04/05/2020   PAF (paroxysmal atrial fibrillation) (HCC) 04/05/2020   OSA (obstructive sleep apnea)  04/05/2020   Syncope and collapse 04/05/2020   Type 2 diabetes mellitus with hyperglycemia, with long-term current use of insulin  (HCC) 12/03/2019   Diabetic polyneuropathy associated with type 2 diabetes mellitus (HCC) 12/03/2019   Erectile dysfunction 12/03/2019   Paranoid schizophrenia, chronic condition (HCC) 01/26/2015   Severe recurrent major depressive disorder with psychotic features (HCC) 01/26/2015   GAD (generalized anxiety disorder) 01/26/2015   OCD (obsessive compulsive disorder)  01/26/2015   Panic disorder without agoraphobia 01/26/2015   Insomnia 01/26/2015    Current Outpatient Medications:    acetaminophen  (TYLENOL ) 500 MG tablet, Take 2 tablets (1,000 mg total) by mouth 2 (two) times daily as needed., Disp: 180 tablet, Rfl: 3   albuterol  (VENTOLIN  HFA) 108 (90 Base) MCG/ACT inhaler, Inhale 2 puffs into the lungs every 6 (six) hours as needed for wheezing or shortness of breath., Disp: 18 g, Rfl: 1   aspirin  EC 81 MG tablet, Take 81 mg by mouth daily. Swallow whole., Disp: , Rfl:    atorvastatin  (LIPITOR) 40 MG tablet, Take 1 tablet (40 mg total) by mouth daily., Disp: 90 tablet, Rfl: 1   blood glucose meter kit and supplies KIT, Dispense based on patient and insurance preference. Use up to four times daily as directed. (FOR ICD-9 250.00, 250.01)., Disp: 1 each, Rfl: 0   busPIRone  (BUSPAR ) 10 MG tablet, Take 1 tablet (10 mg total) by mouth 2 (two) times daily., Disp: 180 tablet, Rfl: 0   colchicine  0.6 MG tablet, Daily for 7 days then daily PRN for gout flare, Disp: 30 tablet, Rfl: 0   Continuous Glucose Receiver (FREESTYLE LIBRE 3 READER) DEVI, 1 each by Does not apply route daily. Use to check glucose continuously, Disp: 1 each, Rfl: 1   Continuous Glucose Sensor (FREESTYLE LIBRE 3 PLUS SENSOR) MISC, Change sensor every 15 days., Disp: 6 each, Rfl: 3   diclofenac  Sodium (VOLTAREN ) 1 % GEL, Apply 2 g topically daily as needed., Disp: 150 g, Rfl: 2   Diclofenac  Sodium 3 % GEL, Apply 1 Application topically daily as needed., Disp: 100 g, Rfl: 2   empagliflozin  (JARDIANCE ) 10 MG TABS tablet, Take 1 tablet (10 mg total) by mouth daily., Disp: 90 tablet, Rfl: 3   ferrous sulfate  325 (65 FE) MG tablet, Take 1 tablet (325 mg total) by mouth every other day., Disp: 30 tablet, Rfl: 1   furosemide  (LASIX ) 40 MG tablet, Take 1 tablet (40 mg total) by mouth 2 (two) times daily., Disp: 90 tablet, Rfl: 0   gabapentin  (NEURONTIN ) 800 MG tablet, Take 1 tablet (800 mg total) by mouth  3 (three) times daily., Disp: 90 tablet, Rfl: 0   Insulin  Lispro Prot & Lispro (HUMALOG  MIX 75/25 KWIKPEN) (75-25) 100 UNIT/ML Kwikpen, Inject 30 Units into the skin 2 (two) times daily., Disp: 60 mL, Rfl: 3   metFORMIN  (GLUCOPHAGE -XR) 500 MG 24 hr tablet, Take 1 tablet (500 mg total) by mouth 2 (two) times daily with a meal., Disp: 180 tablet, Rfl: 3   metoprolol  succinate (TOPROL -XL) 50 MG 24 hr tablet, Take 1.5 tablets (75 mg total) by mouth daily., Disp: 45 tablet, Rfl: 11   Multiple Vitamins-Minerals (CENTRUM SILVER 50+MEN) TABS, Take 1 tablet by mouth daily., Disp: , Rfl:    Omega-3 Fatty Acids (FISH OIL) 1000 MG CAPS, Take 1,000 mg by mouth at bedtime., Disp: , Rfl:    pantoprazole  (PROTONIX ) 40 MG tablet, Take 1 tablet by mouth once daily, Disp: 90 tablet, Rfl: 0   potassium chloride  SA (  KLOR-CON  M) 20 MEQ tablet, Take 2 tablets (40 mEq total) by mouth daily., Disp: 60 tablet, Rfl: 6   QUEtiapine  (SEROQUEL ) 200 MG tablet, Take 200 mg by mouth at bedtime., Disp: , Rfl:    RELION PEN NEEDLES 31G X 6 MM MISC, INJECT 1 PEN INTO THE SKIN 2 TIMES DAILY, Disp: 100 each, Rfl: 0   sacubitril -valsartan  (ENTRESTO ) 24-26 MG, Take 1 tablet by mouth 2 (two) times daily., Disp: 180 tablet, Rfl: 1   Semaglutide , 2 MG/DOSE, (OZEMPIC , 2 MG/DOSE,) 8 MG/3ML SOPN, Inject 2 mg into the skin once a week. Monday, Disp: 3 mL, Rfl: 2   sildenafil  (VIAGRA ) 100 MG tablet, TAKE 1/2 TO 1 (ONE-HALF TO ONE) TABLET BY MOUTH ONCE DAILY AS NEEDED FOR ERECTILE DYSFUNCTION, Disp: 5 tablet, Rfl: 0   spironolactone  (ALDACTONE ) 25 MG tablet, Take 1 tablet (25 mg total) by mouth daily., Disp: 90 tablet, Rfl: 3   etodolac  (LODINE ) 400 MG tablet, Take 1 tablet (400 mg total) by mouth 2 (two) times daily. (Patient not taking: Reported on 08/05/2024), Disp: 60 tablet, Rfl: 3   tiZANidine  (ZANAFLEX ) 4 MG tablet, Take 1 tablet (4 mg total) by mouth 3 (three) times daily. (Patient not taking: Reported on 08/05/2024), Disp: 90 tablet, Rfl: 1    Varenicline  Tartrate, Starter, (CHANTIX  STARTING MONTH PAK) 0.5 MG X 11 & 1 MG X 42 TBPK, Take as instructed on packaging (Patient not taking: Reported on 08/05/2024), Disp: 1 each, Rfl: 0 No Known Allergies   Social History   Socioeconomic History   Marital status: Legally Separated    Spouse name: Not on file   Number of children: Not on file   Years of education: Not on file   Highest education level: Not on file  Occupational History   Not on file  Tobacco Use   Smoking status: Never    Passive exposure: Never   Smokeless tobacco: Never  Vaping Use   Vaping status: Never Used  Substance and Sexual Activity   Alcohol use: Not Currently    Alcohol/week: 2.0 standard drinks of alcohol    Types: 2 Cans of beer per week   Drug use: No   Sexual activity: Yes    Birth control/protection: None  Other Topics Concern   Not on file  Social History Narrative   Not on file   Social Drivers of Health   Financial Resource Strain: Medium Risk (05/24/2023)   Overall Financial Resource Strain (CARDIA)    Difficulty of Paying Living Expenses: Somewhat hard  Food Insecurity: No Food Insecurity (02/04/2024)   Hunger Vital Sign    Worried About Running Out of Food in the Last Year: Never true    Ran Out of Food in the Last Year: Never true  Transportation Needs: No Transportation Needs (05/24/2023)   PRAPARE - Administrator, Civil Service (Medical): No    Lack of Transportation (Non-Medical): No  Physical Activity: Sufficiently Active (05/24/2023)   Exercise Vital Sign    Days of Exercise per Week: 7 days    Minutes of Exercise per Session: 30 min  Stress: No Stress Concern Present (05/24/2023)   [employer] of Occupational Health - Occupational Stress Questionnaire    Feeling of Stress : Not at all  Social Connections: Moderately Isolated (05/24/2023)   Social Connection and Isolation Panel    Frequency of Communication with Friends and Family: More than three times a  week    Frequency of Social Gatherings with Friends and  Family: More than three times a week    Attends Religious Services: Never    Active Member of Clubs or Organizations: No    Attends Banker Meetings: Never    Marital Status: Living with partner  Intimate Partner Violence: Not At Risk (05/24/2023)   Humiliation, Afraid, Rape, and Kick questionnaire    Fear of Current or Ex-Partner: No    Emotionally Abused: No    Physically Abused: No    Sexually Abused: No    Physical Exam      Future Appointments  Date Time Provider Department Center  08/26/2024  3:30 PM [facility] PHARMACIST [facility]  09/05/2024  7:40 AM [facility] HVT DEVICE REMOTES [facility]  09/17/2024  2:00 PM Oley Bascom RAMAN, NP [facility]

## 2024-08-06 ENCOUNTER — Other Ambulatory Visit: Payer: Self-pay | Admitting: Nurse Practitioner

## 2024-08-06 NOTE — Telephone Encounter (Signed)
 [facility] should be where the RX is sent- shows you sent to [facility]. [facility] reports they never received it. He is no longer using [facility].   Sorry for any confusion.   Thanks.    [name], EMT-Paramedic [phone] 08/06/2024

## 2024-08-14 NOTE — Telephone Encounter (Signed)
 The med needed is Gabapentin  to [facility], EMT-Paramedic [phone] 08/14/2024

## 2024-08-15 NOTE — Addendum Note (Signed)
 Addended by: STARLENE PANCOAST D on: 08/15/2024 09:29 AM   Modules accepted: Orders

## 2024-08-20 ENCOUNTER — Telehealth (HOSPITAL_COMMUNITY): Payer: Self-pay

## 2024-08-20 NOTE — Telephone Encounter (Signed)
 Attempted to reach [facility] for our paramedicine visit- no success. Phone seems to be cut off. I will continue to attempt.   [name], EMT-Paramedic [phone] 08/20/2024

## 2024-08-25 ENCOUNTER — Other Ambulatory Visit (HOSPITAL_COMMUNITY): Payer: Self-pay

## 2024-08-25 NOTE — Progress Notes (Signed)
 Paramedicine Encounter    Patient ID: [name], male    DOB: 11/12/1961, 63 y.o.   MRN: [id number]   Complaints- intermittent chest pain around ICD over the last few weeks denied firings no device warnings.   Assessment- CAOX4, diaphoretic and warm, reporting to have been cleaning prior to our arrival, no lower leg edema, lungs clear, BP high has been non compliant with meds for a week.   Compliance with meds- off meds for a week   Pill box filled- for two weeks   Refills needed- lasix , entresto , freestyle, seroquel , pantoprazole , gabapentin    Meds changes since last visit- none     Social changes- none- plan to get on bubble packs and d/c    VISIT SUMMARY- Arrived for home visit for [name] who reports to be feeling okay with no concerns. He says that he has been feeling fine but having some intermittent chest pain around his ICD site. Not a recent implant and no recent firings. No visual issues on assessment. Lungs clear. BP elevated, I was unable to reach him by phone last week- he says he had to get a new phone. I reviewed importance of med compliance and HF education with diet and fluid restrictions. He agreed. Meds reviewed and pill box filled for two weeks. WEEK 2 needs LASIX . I will follow up sometime later this week for a med rec. Home visit complete.   BP (!) 172/94   Pulse 71   Resp 20   Wt 282 lb (127.9 kg)   SpO2 98%   BMI 35.25 kg/m  Weight yesterday-- didn't weigh  Last visit weight--286lbs      ACTION: Home visit completed     Patient Care Team: Oley Bascom RAMAN, NP as PCP - General (Pulmonary Disease) Shlomo Wilbert SAUNDERS, MD as PCP - Cardiology (Cardiology) [name] Ezra RAMAN, MD as PCP - Advanced Heart Failure (Cardiology) Cindie Ole DASEN, MD as PCP - Electrophysiology (Cardiology) Cathern Andriette DEL, LCSW as Social Worker (Licensed Clinical Social Worker) Eda Iha, MD (Inactive) as Consulting Physician (Gastroenterology) Brinda Lorain SQUIBB, [facility]  (Pharmacist)  Patient Active Problem List   Diagnosis Date Noted   PVC's (premature ventricular contractions) 09/28/2023   Acute right-sided low back pain with right-sided sciatica 05/14/2023   Multiple joint pain 12/22/2022   Obesity (BMI 35.0-39.9 without comorbidity) 07/18/2022   Cellulitis 06/22/2022   Cellulitis, leg 06/20/2022   Presence of Watchman left atrial appendage closure device 10/28/2021   Atrial fibrillation (HCC) 10/27/2021   Melena    Chronic diastolic CHF (congestive heart failure) (HCC)    GI bleed 06/29/2021   Severe anemia 06/29/2021   Adenomatous polyp of ascending colon    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   Acute blood loss anemia    Angiodysplasia of stomach    Chronic anticoagulation    CHF exacerbation (HCC) 06/15/2020   AKI (acute kidney injury) 06/15/2020   CKD (chronic kidney disease), stage III (HCC) 06/15/2020   Anemia due to chronic blood loss    Gastric AVM    Angiodysplasia of duodenum with hemorrhage    Chronic systolic CHF (congestive heart failure) (HCC) 04/05/2020   Anemia 04/05/2020   PAF (paroxysmal atrial fibrillation) (HCC) 04/05/2020   OSA (obstructive sleep apnea) 04/05/2020   Syncope and collapse 04/05/2020   Type 2 diabetes mellitus with hyperglycemia, with long-term current use of insulin  (HCC) 12/03/2019   Diabetic polyneuropathy associated with type 2 diabetes mellitus (HCC) 12/03/2019   Erectile dysfunction 12/03/2019  Paranoid schizophrenia, chronic condition (HCC) 01/26/2015   Severe recurrent major depressive disorder with psychotic features (HCC) 01/26/2015   GAD (generalized anxiety disorder) 01/26/2015   OCD (obsessive compulsive disorder) 01/26/2015   Panic disorder without agoraphobia 01/26/2015   Insomnia 01/26/2015    Current Outpatient Medications:    acetaminophen  (TYLENOL ) 500 MG tablet, Take 2 tablets (1,000 mg total) by mouth 2 (two) times daily as needed., Disp: 180 tablet,  Rfl: 3   albuterol  (VENTOLIN  HFA) 108 (90 Base) MCG/ACT inhaler, Inhale 2 puffs into the lungs every 6 (six) hours as needed for wheezing or shortness of breath., Disp: 18 g, Rfl: 1   aspirin  EC 81 MG tablet, Take 81 mg by mouth daily. Swallow whole., Disp: , Rfl:    atorvastatin  (LIPITOR) 40 MG tablet, Take 1 tablet (40 mg total) by mouth daily., Disp: 90 tablet, Rfl: 1   blood glucose meter kit and supplies KIT, Dispense based on patient and insurance preference. Use up to four times daily as directed. (FOR ICD-9 250.00, 250.01)., Disp: 1 each, Rfl: 0   busPIRone  (BUSPAR ) 10 MG tablet, Take 1 tablet (10 mg total) by mouth 2 (two) times daily., Disp: 180 tablet, Rfl: 0   colchicine  0.6 MG tablet, Daily for 7 days then daily PRN for gout flare, Disp: 30 tablet, Rfl: 0   Continuous Glucose Receiver (FREESTYLE LIBRE 3 READER) DEVI, 1 each by Does not apply route daily. Use to check glucose continuously, Disp: 1 each, Rfl: 1   Continuous Glucose Sensor (FREESTYLE LIBRE 3 PLUS SENSOR) MISC, Change sensor every 15 days., Disp: 6 each, Rfl: 3   diclofenac  Sodium (VOLTAREN ) 1 % GEL, Apply 2 g topically daily as needed., Disp: 150 g, Rfl: 2   Diclofenac  Sodium 3 % GEL, Apply 1 Application topically daily as needed., Disp: 100 g, Rfl: 2   empagliflozin  (JARDIANCE ) 10 MG TABS tablet, Take 1 tablet (10 mg total) by mouth daily., Disp: 90 tablet, Rfl: 3   ferrous sulfate  325 (65 FE) MG tablet, Take 1 tablet (325 mg total) by mouth every other day., Disp: 30 tablet, Rfl: 1   furosemide  (LASIX ) 40 MG tablet, Take 1 tablet (40 mg total) by mouth 2 (two) times daily., Disp: 90 tablet, Rfl: 0   gabapentin  (NEURONTIN ) 800 MG tablet, Take 1 tablet (800 mg total) by mouth 3 (three) times daily., Disp: 90 tablet, Rfl: 0   Insulin  Lispro Prot & Lispro (HUMALOG  MIX 75/25 KWIKPEN) (75-25) 100 UNIT/ML Kwikpen, Inject 30 Units into the skin 2 (two) times daily., Disp: 60 mL, Rfl: 3   metFORMIN  (GLUCOPHAGE -XR) 500 MG 24 hr  tablet, Take 1 tablet (500 mg total) by mouth 2 (two) times daily with a meal., Disp: 180 tablet, Rfl: 3   metoprolol  succinate (TOPROL -XL) 50 MG 24 hr tablet, Take 1.5 tablets (75 mg total) by mouth daily., Disp: 45 tablet, Rfl: 11   Multiple Vitamins-Minerals (CENTRUM SILVER 50+MEN) TABS, Take 1 tablet by mouth daily., Disp: , Rfl:    Omega-3 Fatty Acids (FISH OIL) 1000 MG CAPS, Take 1,000 mg by mouth at bedtime., Disp: , Rfl:    pantoprazole  (PROTONIX ) 40 MG tablet, Take 1 tablet by mouth once daily, Disp: 90 tablet, Rfl: 0   potassium chloride  SA (KLOR-CON  M) 20 MEQ tablet, Take 2 tablets (40 mEq total) by mouth daily., Disp: 60 tablet, Rfl: 6   QUEtiapine  (SEROQUEL ) 200 MG tablet, Take 200 mg by mouth at bedtime., Disp: , Rfl:    RELION PEN NEEDLES  31G X 6 MM MISC, INJECT 1 PEN INTO THE SKIN 2 TIMES DAILY, Disp: 100 each, Rfl: 0   sacubitril -valsartan  (ENTRESTO ) 24-26 MG, Take 1 tablet by mouth 2 (two) times daily., Disp: 180 tablet, Rfl: 1   Semaglutide , 2 MG/DOSE, (OZEMPIC , 2 MG/DOSE,) 8 MG/3ML SOPN, Inject 2 mg into the skin once a week. Monday, Disp: 3 mL, Rfl: 2   sildenafil  (VIAGRA ) 100 MG tablet, TAKE 1/2 TO 1 (ONE-HALF TO ONE) TABLET BY MOUTH ONCE DAILY AS NEEDED FOR ERECTILE DYSFUNCTION, Disp: 5 tablet, Rfl: 0   spironolactone  (ALDACTONE ) 25 MG tablet, Take 1 tablet (25 mg total) by mouth daily., Disp: 90 tablet, Rfl: 3   etodolac  (LODINE ) 400 MG tablet, Take 1 tablet (400 mg total) by mouth 2 (two) times daily. (Patient not taking: Reported on 08/25/2024), Disp: 60 tablet, Rfl: 3   tiZANidine  (ZANAFLEX ) 4 MG tablet, Take 1 tablet (4 mg total) by mouth 3 (three) times daily. (Patient not taking: Reported on 08/25/2024), Disp: 90 tablet, Rfl: 1   Varenicline  Tartrate, Starter, (CHANTIX  STARTING MONTH PAK) 0.5 MG X 11 & 1 MG X 42 TBPK, Take as instructed on packaging (Patient not taking: Reported on 08/25/2024), Disp: 1 each, Rfl: 0 No Known Allergies   Social History   Socioeconomic  History   Marital status: Legally Separated    Spouse name: Not on file   Number of children: Not on file   Years of education: Not on file   Highest education level: Not on file  Occupational History   Not on file  Tobacco Use   Smoking status: Never    Passive exposure: Never   Smokeless tobacco: Never  Vaping Use   Vaping status: Never Used  Substance and Sexual Activity   Alcohol use: Not Currently    Alcohol/week: 2.0 standard drinks of alcohol    Types: 2 Cans of beer per week   Drug use: No   Sexual activity: Yes    Birth control/protection: None  Other Topics Concern   Not on file  Social History Narrative   Not on file   Social Drivers of Health   Financial Resource Strain: Medium Risk (05/24/2023)   Overall Financial Resource Strain (CARDIA)    Difficulty of Paying Living Expenses: Somewhat hard  Food Insecurity: No Food Insecurity (02/04/2024)   Hunger Vital Sign    Worried About Running Out of Food in the Last Year: Never true    Ran Out of Food in the Last Year: Never true  Transportation Needs: No Transportation Needs (05/24/2023)   PRAPARE - Administrator, Civil Service (Medical): No    Lack of Transportation (Non-Medical): No  Physical Activity: Sufficiently Active (05/24/2023)   Exercise Vital Sign    Days of Exercise per Week: 7 days    Minutes of Exercise per Session: 30 min  Stress: No Stress Concern Present (05/24/2023)   [employer] of Occupational Health - Occupational Stress Questionnaire    Feeling of Stress : Not at all  Social Connections: Moderately Isolated (05/24/2023)   Social Connection and Isolation Panel    Frequency of Communication with Friends and Family: More than three times a week    Frequency of Social Gatherings with Friends and Family: More than three times a week    Attends Religious Services: Never    Database administrator or Organizations: No    Attends Banker Meetings: Never    Marital  Status: Living with partner  Intimate Partner Violence: Not At Risk (05/24/2023)   Humiliation, Afraid, Rape, and Kick questionnaire    Fear of Current or Ex-Partner: No    Emotionally Abused: No    Physically Abused: No    Sexually Abused: No    Physical Exam      Future Appointments  Date Time Provider Department Center  08/26/2024  3:30 PM [facility] PHARMACIST [facility]  09/05/2024  7:40 AM [facility] DEVICE REMOTES [facility]  09/17/2024  2:00 PM Oley Bascom RAMAN, NP [facility]

## 2024-08-26 ENCOUNTER — Ambulatory Visit: Payer: Self-pay

## 2024-08-26 ENCOUNTER — Telehealth: Payer: Self-pay

## 2024-08-26 NOTE — Telephone Encounter (Signed)
 Patient had arrived to appt at [facility] today schedule at 3:30PM. However, the power was out at the clinic. The power turned back on right after the patient left the building. I was not aware that he was present. Attempted to call to reschedule without success. Call could not be completed as dialed, unable to leave VM. Will ask population health pharmacy technician to follow-up and reschedule with me.   Lorain Baseman, PharmD [facility] [phone]

## 2024-08-26 NOTE — Progress Notes (Deleted)
 08/26/2024 Name: [name] MRN: [id number] DOB: [birth year]  No chief complaint on file.   [name] is a 63 y.o. year old male who presented for a telephone visit.   They were referred to the pharmacist by their PCP for assistance in managing diabetes. PMH includes HFpEF (EF 45-50% in Feb 2025) with ICD in place, Afib s/p Watchman, PVCs, GI bleed, T2DM, CKD3, schizophrenia, anxiety, OCS,     Subjective: Patient was last seen by PCP, Bascom Borer, NP, on 06/16/24. At last visit, BP was close to goal at 130/84 mmHg, HR 74 bpm. A1C increased from 6.9% to 8.1%. Patient endorsed difficulty adhering to healthy food choices. He reported that he was motivated to quit smoking and was initiated on varenicline .   Today, patient reports doing well. He is wearing a FL3 sensor and uses the reader to monitor. He reports that he has made a lot of diet changes since his last A1C was elevated and his numbers have improved. He reports that he needs refills for several of his medications. He reports that the etodolac  and tizanidine  are not helping his pain (trigger finger). He has not scheduled anything appointment with the hand center yet.    Care Team: Primary Care Provider: Borer Bascom RAMAN, NP ; Next Scheduled Visit: 09/17/24   Medication Access/Adherence  Current Pharmacy:  [id number] - [place], [place] - [address] [place] [address] Phone: [phone] Fax: [phone]  [facility] - [place], [place] - [address] [place] [address] Phone: [phone] Fax: [phone]  [facility] - [place], [place] - [address] [place] [address] Phone: [phone] Fax: [phone]   Patient reports affordability concerns with their medications: No  Patient reports access/transportation concerns to their pharmacy: No  Patient  reports adherence concerns with their medications:  No  - has paramedicine come to house for med organization and review [name])   Diabetes:  Current medications: Jardiance  10 mg daily,  metformin  XR 500 mg BID with meals (last filled 05/27/24), Ozempic  2 mg once weekly (Mondays, states he needs to pick up his shipment from [facility] soon), Humalog  Mix 75/25 30 units BID with meals  Current glucose readings:  Using Freestyle Libre 3 meter - using reader; does not have reader with him today so we are unable to review averages. Reports BG are rarely > 200 mg/dL. Recalls AM readings of 96, 100 mg/dL. Denies BG <70 mg/dL.   Patient denies hypoglycemic s/sx including dizziness, shakiness, sweating. Patient denies hyperglycemic symptoms including polyuria, polydipsia, polyphagia, nocturia, neuropathy, blurred vision.  Current meal patterns: did not discuss in depth today  Current physical activity: did not discuss today  Current medication access support:  - [employer] PAP for Ozempic  (expires Dec 2025) - [employer] for Humalog  Mix 75/25 - patient reports this is mailed to him, but unable to find approval letter/dates. Will collaborate with CPhT, Burnard Lot, to investigate.   Heart Failure (EF 45-50% in Feb 2025):  Current medications:  ACEi/ARB/ARNI: Entresto  24-26 mg BID SGLT2i: Jardiance  10 mg daily Beta blocker: metoprolol  succinate 75 mg (1.5x 50 mg tabs) daily Mineralocorticoid Receptor Antagonist: spironolactone  25 mg daily Diuretic regimen: furosemide  40 mg BID + potassium 20 mEq BID  Patient is managed by paramedicine   Tobacco Abuse:   Quit Attempt History: Most recent quit attempt: current Patient tried Varenicline  but reports he was  not able to tolerate due to nausea and he stopped taking it a couple weeks ago. Reports he had nausea even if he took his doses with food.  Reports that the patches are not effective for him Reports he has not been able to cut back on  smoking yet.    Objective:  BP Readings from Last 3 Encounters:  08/25/24 (!) 172/94  08/05/24 (!) 160/82  07/24/24 130/70    Lab Results  Component Value Date   HGBA1C 8.1 (A) 06/16/2024   HGBA1C 6.9 (A) 02/04/2024   HGBA1C 7.0 08/23/2023       Latest Ref Rng & Units 02/04/2024   10:37 AM 01/01/2024    2:24 PM 09/28/2023    3:48 PM  BMP  Glucose 70 - 99 mg/dL 95  70  875   BUN 8 - 27 mg/dL 10  8  17    Creatinine 0.76 - 1.27 mg/dL 9.07  9.09  8.83   BUN/Creat Ratio 10 - 24 11     Sodium 134 - 144 mmol/L 143  140  138   Potassium 3.5 - 5.2 mmol/L 3.6  3.5  3.8   Chloride 96 - 106 mmol/L 103  106  104   CO2 20 - 29 mmol/L 25  25  24    Calcium  8.6 - 10.2 mg/dL 9.1  8.9  9.0     Lab Results  Component Value Date   CHOL 118 02/04/2024   HDL 45 02/04/2024   LDLCALC 55 02/04/2024   TRIG 93 02/04/2024   CHOLHDL 2.6 02/04/2024    Medications Reviewed Today   Medications were not reviewed in this encounter       Assessment/Plan:   Diabetes: - Currently uncontrolled with most recent A1C of 8.1% above goal <7% and worsened from 6.9% previously - which patient feels was due to dietary indiscretion. Patient is on maximum dose Ozempic  and is tolerating well. Would like to review BG prior to making changes, especially given uncertainty of when patient last received a shipment of Humalog  Mix 75/25. There is room for increasing his doses of metformin  and/or Jardiance  if additional pharmacotherapy is needed.  - Last UACR 49 mg/g 02/04/24 - Patient denies personal or family history of multiple endocrine neoplasia type 2, medullary thyroid  cancer; personal history of pancreatitis or gallbladder disease. - Reviewed long term cardiovascular and renal outcomes of uncontrolled blood sugar - Reviewed goal A1c, goal fasting, and goal 2 hour post prandial glucose - Reviewed hypoglycemia management plan and the rule of 15 - Reviewed dietary modifications including  utilizing the healthy  plate method, limiting portion size of carbohydrate foods, increasing intake of protein and non-starchy vegetables. Counseled patient to stay hydrated with water throughout the day. - Recommend to continue Ozempic  2 mg weekly, Jardiance  10 mg daily, Humalog  75/25 Mix 20 units BID with meals - Provided patient with refills for maintenance medications and CGM. Educated to bring his reader device to all his appointments.  - Next A1C due Oct 2025     Heart Failure: - Currently appropriately managed - Educated that etodolac  is not an appropriate long term medication to control his pain given HF, CKD, and ASCVD history. Advised him to stop taking this NSAID if he does not feel he is getting any benefit. Will discuss alternative treatment with PCP. Also encouraged patient to schedule appt with [facility] to discuss procedures for treating his pain.  - Reviewed appropriate blood pressure monitoring technique and reviewed goal blood pressure -  Reviewed to weigh daily and when to contact cardiology with weight gain - Recommend to continue current regimen    Tobacco Abuse - Currently uncontrolled - Provided motivational interviewing to assess tobacco use and strategies for reduction - Provided information on 1 800 QUIT NOW support program - Discussed methods for improving varenicline  tolerability. Advised him that he could try taking 0.5 mg with food once daily or once every other day to see if this curbed his cravings.   Assisted in access to care by providing patient with phone numbers for [facility]. Also advised him to call paramedic, [name], who was unable to reach him yesterday.    Patient verbalized understanding of treatment plan.    Follow Up Plan:  Pharmacist in person 08/26/24 PCP clinic visit 09/17/24   Lorain Baseman, PharmD [facility] [phone]

## 2024-08-27 ENCOUNTER — Telehealth (HOSPITAL_COMMUNITY): Payer: Self-pay

## 2024-08-27 ENCOUNTER — Other Ambulatory Visit: Payer: Self-pay | Admitting: Nurse Practitioner

## 2024-08-27 NOTE — Telephone Encounter (Signed)
 [name] is needing refills on Lasix  sent to [facility] please. Thanks!   [name], EMT-Paramedic [phone] 08/27/2024

## 2024-08-28 ENCOUNTER — Other Ambulatory Visit (HOSPITAL_COMMUNITY): Payer: Self-pay

## 2024-08-28 MED ORDER — FUROSEMIDE 40 MG PO TABS: 40.0000 mg | ORAL_TABLET | Freq: Two times a day (BID) | ORAL | 3 refills | Status: DC

## 2024-09-01 ENCOUNTER — Other Ambulatory Visit: Payer: Self-pay

## 2024-09-01 ENCOUNTER — Telehealth: Payer: Self-pay

## 2024-09-01 ENCOUNTER — Telehealth: Payer: Self-pay | Admitting: Pharmacy Technician

## 2024-09-01 ENCOUNTER — Other Ambulatory Visit (HOSPITAL_COMMUNITY): Payer: Self-pay

## 2024-09-01 NOTE — Progress Notes (Signed)
 Attempted to contact patient for scheduled appointment for medication management. Left HIPAA compliant message for patient to return my call at their convenience.   Lorain Baseman, PharmD [facility] [phone]

## 2024-09-01 NOTE — Progress Notes (Signed)
   09/01/2024  Patient ID: [name], male   DOB: 11/06/1961, 63 y.o.   MRN: [id number]  Patient engaged with clinical pharmacist for management of diabetes on 07/22/2024. Outreach by [employer] technician was requested.   Outreached patient to discuss diabetes medication management. Left voicemail for patient to return my call at their convenience.    Briah Nary, CPhT [facility] Office: [phone] Email: [name]

## 2024-09-01 NOTE — Progress Notes (Unsigned)
 09/01/2024 Name: [name] MRN: [id number] DOB: 01/30/1961  No chief complaint on file.   [name] is a 62 y.o. year old male who presented for a telephone visit.   They were referred to the pharmacist by their PCP for assistance in managing diabetes. PMH includes HFpEF (EF 45-50% in Feb 2025) with ICD in place, Afib s/p Watchman, PVCs, GI bleed, T2DM, CKD3, schizophrenia, anxiety, OCS,     Subjective: Patient was last seen by PCP, Bascom Borer, NP, on 06/16/24. At last visit, BP was close to goal at 130/84 mmHg, HR 74 bpm. A1C increased from 6.9% to 8.1%. Patient endorsed difficulty adhering to healthy food choices. He reported that he was motivated to quit smoking and was initiated on varenicline .   Today, patient reports doing well. He is wearing a FL3 sensor and uses the reader to monitor. He reports that he has made a lot of diet changes since his last A1C was elevated and his numbers have improved. He reports that he needs refills for several of his medications. He reports that the etodolac  and tizanidine  are not helping his pain (trigger finger). He has not scheduled anything appointment with the hand center yet.   Not going to be able to get Ozempic  in 2026? Medicare LIS? Unable to do test claim - needs PA Refer to [facility] new in person pharm appt in Nov  Care Team: Primary Care Provider: Borer Bascom RAMAN, NP ; Next Scheduled Visit: 09/17/24   Medication Access/Adherence  Current Pharmacy:  [id number] - [place], [place] - [address] [place] [address] Phone: [phone] Fax: [phone]  [facility] - [place], [place] - [address] [place] [address] Phone: [phone] Fax: [phone]  [facility] - [place], [place] - [address] [place] [address] Phone: [phone] Fax:  [phone]   Patient reports affordability concerns with their medications: No  Patient reports access/transportation concerns to their pharmacy: No  Patient reports adherence concerns with their medications:  No  - has paramedicine come to house for med organization and review [name])   Diabetes:  Current medications: Jardiance  10 mg daily,  metformin  XR 500 mg BID with meals (last filled 05/27/24), Ozempic  2 mg once weekly (Mondays, states he needs to pick up his shipment from [facility] soon), Humalog  Mix 75/25 30 units BID with meals  Current glucose readings:  Using Freestyle Libre 3 meter - using reader; does not have reader with him today so we are unable to review averages. Reports BG are rarely > 200 mg/dL. Recalls AM readings of 96, 100 mg/dL. Denies BG <70 mg/dL.   Patient denies hypoglycemic s/sx including dizziness, shakiness, sweating. Patient denies hyperglycemic symptoms including polyuria, polydipsia, polyphagia, nocturia, neuropathy, blurred vision.  Current meal patterns: did not discuss in depth today  Current physical activity: did not discuss today  Current medication access support:  - [employer] PAP for Ozempic  (expires Dec 2025) - [employer] for Humalog  Mix 75/25 - patient reports this is mailed to him, but unable to find approval letter/dates. Will collaborate with CPhT, Burnard Lot, to investigate.   Heart Failure (EF 45-50% in Feb 2025):  Current medications:  ACEi/ARB/ARNI: Entresto  24-26 mg BID SGLT2i: Jardiance  10 mg daily Beta blocker: metoprolol  succinate 75 mg (1.5x 50 mg tabs) daily Mineralocorticoid Receptor Antagonist: spironolactone  25 mg daily Diuretic regimen: furosemide  40 mg BID +  potassium 20 mEq BID  Patient is managed by paramedicine   Tobacco Abuse:   Quit Attempt History: Most recent quit attempt: current Patient tried Varenicline  but reports he was not able to tolerate due to nausea and he stopped taking it a couple  weeks ago. Reports he had nausea even if he took his doses with food.  Reports that the patches are not effective for him Reports he has not been able to cut back on smoking yet.    Objective:  BP Readings from Last 3 Encounters:  08/25/24 (!) 172/94  08/05/24 (!) 160/82  07/24/24 130/70    Lab Results  Component Value Date   HGBA1C 8.1 (A) 06/16/2024   HGBA1C 6.9 (A) 02/04/2024   HGBA1C 7.0 08/23/2023       Latest Ref Rng & Units 02/04/2024   10:37 AM 01/01/2024    2:24 PM 09/28/2023    3:48 PM  BMP  Glucose 70 - 99 mg/dL 95  70  875   BUN 8 - 27 mg/dL 10  8  17    Creatinine 0.76 - 1.27 mg/dL 9.07  9.09  8.83   BUN/Creat Ratio 10 - 24 11     Sodium 134 - 144 mmol/L 143  140  138   Potassium 3.5 - 5.2 mmol/L 3.6  3.5  3.8   Chloride 96 - 106 mmol/L 103  106  104   CO2 20 - 29 mmol/L 25  25  24    Calcium  8.6 - 10.2 mg/dL 9.1  8.9  9.0     Lab Results  Component Value Date   CHOL 118 02/04/2024   HDL 45 02/04/2024   LDLCALC 55 02/04/2024   TRIG 93 02/04/2024   CHOLHDL 2.6 02/04/2024    Medications Reviewed Today   Medications were not reviewed in this encounter       Assessment/Plan:   Diabetes: - Currently uncontrolled with most recent A1C of 8.1% above goal <7% and worsened from 6.9% previously - which patient feels was due to dietary indiscretion. Patient is on maximum dose Ozempic  and is tolerating well. Would like to review BG prior to making changes, especially given uncertainty of when patient last received a shipment of Humalog  Mix 75/25. There is room for increasing his doses of metformin  and/or Jardiance  if additional pharmacotherapy is needed.  - Last UACR 49 mg/g 02/04/24 - Patient denies personal or family history of multiple endocrine neoplasia type 2, medullary thyroid  cancer; personal history of pancreatitis or gallbladder disease. - Reviewed long term cardiovascular and renal outcomes of uncontrolled blood sugar - Reviewed goal A1c, goal fasting,  and goal 2 hour post prandial glucose - Reviewed hypoglycemia management plan and the rule of 15 - Reviewed dietary modifications including  utilizing the healthy plate method, limiting portion size of carbohydrate foods, increasing intake of protein and non-starchy vegetables. Counseled patient to stay hydrated with water throughout the day. - Recommend to continue Ozempic  2 mg weekly, Jardiance  10 mg daily, Humalog  75/25 Mix 20 units BID with meals - Provided patient with refills for maintenance medications and CGM. Educated to bring his reader device to all his appointments.  - Next A1C due Oct 2025     Heart Failure: - Currently appropriately managed - Educated that etodolac  is not an appropriate long term medication to control his pain given HF, CKD, and ASCVD history. Advised him to stop taking this NSAID if he does not feel he is getting any benefit. Will discuss alternative treatment with  PCP. Also encouraged patient to schedule appt with [facility] to discuss procedures for treating his pain.  - Reviewed appropriate blood pressure monitoring technique and reviewed goal blood pressure - Reviewed to weigh daily and when to contact cardiology with weight gain - Recommend to continue current regimen    Tobacco Abuse - Currently uncontrolled - Provided motivational interviewing to assess tobacco use and strategies for reduction - Provided information on 1 800 QUIT NOW support program - Discussed methods for improving varenicline  tolerability. Advised him that he could try taking 0.5 mg with food once daily or once every other day to see if this curbed his cravings.   Assisted in access to care by providing patient with phone numbers for [facility]. Also advised him to call paramedic, [name], who was unable to reach him yesterday.    Patient verbalized understanding of treatment plan.    Follow Up Plan:  Pharmacist in person 08/26/24 PCP clinic visit  09/17/24   Lorain Baseman, PharmD [facility] [phone]

## 2024-09-02 ENCOUNTER — Telehealth: Payer: Self-pay | Admitting: Pharmacy Technician

## 2024-09-02 ENCOUNTER — Telehealth (HOSPITAL_COMMUNITY): Payer: Self-pay

## 2024-09-02 NOTE — Telephone Encounter (Signed)
 [facility]  for Mr. Sagan. They do not have RX for same to deliver for him.   Thanks.    [name], EMT-Paramedic [phone] 09/02/2024

## 2024-09-02 NOTE — Progress Notes (Signed)
   09/02/2024  Patient ID: [name], male   DOB: Apr 13, 1961, 63 y.o.   MRN: [id number]  Patient engaged with clinical pharmacist for management of diabetes on 07/22/2024. Outreach by [employer] technician was requested.   Outreached patient to discuss diabetes medication management. Left voicemail for patient to return my call at their convenience.    Tationa Stech, CPhT [facility] Office: [phone] Email: [email or web address]

## 2024-09-03 ENCOUNTER — Other Ambulatory Visit: Payer: Self-pay | Admitting: Nurse Practitioner

## 2024-09-03 DIAGNOSIS — E1165 Type 2 diabetes mellitus with hyperglycemia: Secondary | ICD-10-CM

## 2024-09-03 DIAGNOSIS — G6289 Other specified polyneuropathies: Secondary | ICD-10-CM

## 2024-09-03 MED ORDER — GABAPENTIN 800 MG PO TABS: 800.0000 mg | ORAL_TABLET | Freq: Three times a day (TID) | ORAL | 0 refills | Status: DC

## 2024-09-04 ENCOUNTER — Telehealth: Payer: Self-pay | Admitting: Pharmacy Technician

## 2024-09-04 NOTE — Progress Notes (Signed)
   09/04/2024  Patient ID: [name], male   DOB: 1961-07-13, 63 y.o.   MRN: [id number]  Patient engaged with clinical pharmacist for management of diabetes on 07/22/24. Outreach by [employer] technician was requested.   Outreached patient to discuss diabetes medication management. Left voicemail for patient to return my call at their convenience.    Laneshia Pina, CPhT [facility] Office: [phone] Email: [email or web address]

## 2024-09-05 ENCOUNTER — Ambulatory Visit (INDEPENDENT_AMBULATORY_CARE_PROVIDER_SITE_OTHER): Payer: Medicare Other

## 2024-09-05 DIAGNOSIS — I480 Paroxysmal atrial fibrillation: Secondary | ICD-10-CM | POA: Diagnosis not present

## 2024-09-05 NOTE — Progress Notes (Signed)
 Remote ICD Transmission

## 2024-09-09 LAB — CUP PACEART REMOTE DEVICE CHECK
Battery Voltage: 89
Date Time Interrogation Session: 20251014112447
Implantable Lead Connection Status: 753985
Implantable Lead Implant Date: 20211008
Implantable Lead Location: 753860
Implantable Lead Model: 436910
Implantable Lead Serial Number: 81404997
Implantable Pulse Generator Implant Date: 20211008
Pulse Gen Model: 429525
Pulse Gen Serial Number: 84810752

## 2024-09-09 NOTE — Progress Notes (Signed)
 Remote ICD Transmission

## 2024-09-10 ENCOUNTER — Ambulatory Visit: Payer: Self-pay | Admitting: Cardiology

## 2024-09-17 ENCOUNTER — Other Ambulatory Visit (HOSPITAL_COMMUNITY): Payer: Self-pay

## 2024-09-17 ENCOUNTER — Ambulatory Visit (INDEPENDENT_AMBULATORY_CARE_PROVIDER_SITE_OTHER): Payer: Self-pay | Admitting: Nurse Practitioner

## 2024-09-17 ENCOUNTER — Other Ambulatory Visit: Payer: Self-pay | Admitting: Nurse Practitioner

## 2024-09-17 ENCOUNTER — Encounter: Payer: Self-pay | Admitting: Nurse Practitioner

## 2024-09-17 VITALS — BP 132/83 | HR 72 | Ht 74.0 in | Wt 291.0 lb

## 2024-09-17 DIAGNOSIS — F419 Anxiety disorder, unspecified: Secondary | ICD-10-CM | POA: Diagnosis not present

## 2024-09-17 DIAGNOSIS — E1165 Type 2 diabetes mellitus with hyperglycemia: Secondary | ICD-10-CM

## 2024-09-17 DIAGNOSIS — Z794 Long term (current) use of insulin: Secondary | ICD-10-CM | POA: Diagnosis not present

## 2024-09-17 DIAGNOSIS — R7309 Other abnormal glucose: Secondary | ICD-10-CM

## 2024-09-17 LAB — POCT GLYCOSYLATED HEMOGLOBIN (HGB A1C): Hemoglobin A1C: 6.7 % — AB (ref 4.0–5.6)

## 2024-09-17 MED ORDER — PANTOPRAZOLE SODIUM 40 MG PO TBEC: 40.0000 mg | DELAYED_RELEASE_TABLET | Freq: Every day | ORAL | 0 refills | Status: DC

## 2024-09-17 MED ORDER — METFORMIN HCL ER 500 MG PO TB24
500.0000 mg | ORAL_TABLET | Freq: Two times a day (BID) | ORAL | 3 refills | Status: AC
Start: 2024-09-17 — End: ?

## 2024-09-17 MED ORDER — BUSPIRONE HCL 10 MG PO TABS: 10.0000 mg | ORAL_TABLET | Freq: Two times a day (BID) | ORAL | 0 refills | Status: DC

## 2024-09-17 MED ORDER — QUETIAPINE FUMARATE 200 MG PO TABS: 200.0000 mg | ORAL_TABLET | Freq: Every day | ORAL | 2 refills | Status: AC

## 2024-09-17 MED ORDER — INSULIN LISPRO PROT & LISPRO (75-25 MIX) 100 UNIT/ML KWIKPEN: 30.0000 [IU] | PEN_INJECTOR | Freq: Two times a day (BID) | SUBCUTANEOUS | 3 refills | Status: AC

## 2024-09-17 MED ORDER — DICLOFENAC SODIUM 3 % EX GEL: 1.0000 | Freq: Every day | CUTANEOUS | 2 refills | Status: AC | PRN

## 2024-09-17 MED ORDER — ASPIRIN 81 MG PO TBEC: 81.0000 mg | DELAYED_RELEASE_TABLET | Freq: Every day | ORAL | 2 refills | Status: AC

## 2024-09-17 MED ORDER — ATORVASTATIN CALCIUM 40 MG PO TABS: 40.0000 mg | ORAL_TABLET | Freq: Every day | ORAL | 1 refills | Status: AC

## 2024-09-17 MED ORDER — SPIRONOLACTONE 25 MG PO TABS: 25.0000 mg | ORAL_TABLET | Freq: Every day | ORAL | 3 refills | Status: AC

## 2024-09-17 MED ORDER — COLCHICINE 0.6 MG PO TABS: ORAL_TABLET | ORAL | 0 refills | Status: AC

## 2024-09-17 MED ORDER — ETODOLAC 400 MG PO TABS: 400.0000 mg | ORAL_TABLET | Freq: Two times a day (BID) | ORAL | 3 refills | Status: AC

## 2024-09-17 MED ORDER — FERROUS SULFATE 325 (65 FE) MG PO TABS: 325.0000 mg | ORAL_TABLET | ORAL | 1 refills | Status: AC

## 2024-09-17 MED ORDER — FUROSEMIDE 40 MG PO TABS: 40.0000 mg | ORAL_TABLET | Freq: Two times a day (BID) | ORAL | 3 refills | Status: DC

## 2024-09-17 MED ORDER — EMPAGLIFLOZIN 10 MG PO TABS: 10.0000 mg | ORAL_TABLET | Freq: Every day | ORAL | 3 refills | Status: AC

## 2024-09-17 MED ORDER — METOPROLOL SUCCINATE ER 50 MG PO TB24: 75.0000 mg | ORAL_TABLET | Freq: Every day | ORAL | 11 refills | Status: AC

## 2024-09-17 NOTE — Addendum Note (Signed)
 Addended by: Deon Duer R on: 09/17/2024 03:14 PM   Modules accepted: Orders

## 2024-09-17 NOTE — Progress Notes (Signed)
 Subjective   Patient ID: [name], male    DOB: 27-Aug-1961, 63 y.o.   MRN: [id number]  Chief Complaint  Patient presents with   Diabetes    Type II    trigger finger    Left hand, would like cream that was previously given to help with the spasms     Referring provider: Oley Bascom RAMAN, NP  [name] is a 63 y.o. male with Past Medical History: No date: AICD (automatic cardioverter/defibrillator) present No date: Anxiety No date: Atrial fibrillation (HCC) No date: CAD (coronary artery disease) No date: Cardiomyopathy (HCC) 09/2019: CHF (congestive heart failure) (HCC) No date: Depression No date: Diabetes mellitus 11/2019: Erectile dysfunction No date: GI bleeding 09/2019: H/O right heart catheterization No date: Hypertension No date: Presence of permanent cardiac pacemaker 10/27/2021: Presence of Watchman left atrial appendage closure device     Comment:  27 mm Watchman Flex Device per Dr. Cindie No date: Schizophrenia (HCC) No date: Sleep apnea     Comment:  uses cpap   Diabetes    Patient presents today for diabetes follow-up.  A1c in office today is 6.7.  Pharmacist is following patient for diabetic medication management.  Patient states that he has not been compliant with diet.  He states that he will do better about limiting concentrated sweets.  Blood pressure was slightly elevated in office today.  Patient states that he did eat to bacon sandwiches last night.. Denies chest pain or edema.    Note: Patient complains today of trigger finger to left ring finger. Needs voltaren  gel refilled.     No Known Allergies  Immunization History  Administered Date(s) Administered   Influenza,inj,Quad PF,6+ Mos 10/13/2019, 09/13/2020, 08/03/2021, 11/01/2022   PFIZER(Purple Top)SARS-COV-2 Vaccination 03/13/2020   Pneumococcal Polysaccharide-23 10/16/2019   Tdap 09/13/2020   Zoster Recombinant(Shingrix) 08/03/2021    Tobacco History: Social  History   Tobacco Use  Smoking Status Never   Passive exposure: Never  Smokeless Tobacco Never   Counseling given: Not Answered   Outpatient Encounter Medications as of 09/17/2024  Medication Sig   acetaminophen  (TYLENOL ) 500 MG tablet Take 2 tablets (1,000 mg total) by mouth 2 (two) times daily as needed.   albuterol  (VENTOLIN  HFA) 108 (90 Base) MCG/ACT inhaler Inhale 2 puffs into the lungs every 6 (six) hours as needed for wheezing or shortness of breath.   blood glucose meter kit and supplies KIT Dispense based on patient and insurance preference. Use up to four times daily as directed. (FOR ICD-9 250.00, 250.01).   Continuous Glucose Receiver (FREESTYLE LIBRE 3 READER) DEVI 1 each by Does not apply route daily. Use to check glucose continuously   Continuous Glucose Sensor (FREESTYLE LIBRE 3 PLUS SENSOR) MISC Change sensor every 15 days.   diclofenac  Sodium (VOLTAREN ) 1 % GEL Apply 2 g topically daily as needed.   gabapentin  (NEURONTIN ) 800 MG tablet Take 1 tablet (800 mg total) by mouth 3 (three) times daily.   Multiple Vitamins-Minerals (CENTRUM SILVER 50+MEN) TABS Take 1 tablet by mouth daily.   potassium chloride  SA (KLOR-CON  M) 20 MEQ tablet Take 2 tablets (40 mEq total) by mouth daily.   RELION PEN NEEDLES 31G X 6 MM MISC INJECT 1 PEN INTO THE SKIN 2 TIMES DAILY   sacubitril -valsartan  (ENTRESTO ) 24-26 MG Take 1 tablet by mouth 2 (two) times daily.   Semaglutide , 2 MG/DOSE, (OZEMPIC , 2 MG/DOSE,) 8 MG/3ML SOPN Inject 2 mg into the skin once a week. Monday   [DISCONTINUED]  aspirin  EC 81 MG tablet Take 81 mg by mouth daily. Swallow whole.   [DISCONTINUED] atorvastatin  (LIPITOR) 40 MG tablet Take 1 tablet (40 mg total) by mouth daily.   [DISCONTINUED] busPIRone  (BUSPAR ) 10 MG tablet TAKE 1 TABLET BY MOUTH 2 TIMES DAILY   [DISCONTINUED] colchicine  0.6 MG tablet Daily for 7 days then daily PRN for gout flare   [DISCONTINUED] Diclofenac  Sodium 3 % GEL Apply 1 Application topically daily  as needed.   [DISCONTINUED] empagliflozin  (JARDIANCE ) 10 MG TABS tablet Take 1 tablet (10 mg total) by mouth daily.   [DISCONTINUED] ferrous sulfate  325 (65 FE) MG tablet Take 1 tablet (325 mg total) by mouth every other day.   [DISCONTINUED] furosemide  (LASIX ) 40 MG tablet Take 1 tablet (40 mg total) by mouth 2 (two) times daily.   [DISCONTINUED] Insulin  Lispro Prot & Lispro (HUMALOG  MIX 75/25 KWIKPEN) (75-25) 100 UNIT/ML Kwikpen Inject 30 Units into the skin 2 (two) times daily.   [DISCONTINUED] metFORMIN  (GLUCOPHAGE -XR) 500 MG 24 hr tablet Take 1 tablet (500 mg total) by mouth 2 (two) times daily with a meal.   [DISCONTINUED] metoprolol  succinate (TOPROL -XL) 50 MG 24 hr tablet Take 1.5 tablets (75 mg total) by mouth daily.   [DISCONTINUED] pantoprazole  (PROTONIX ) 40 MG tablet Take 1 tablet by mouth once daily   [DISCONTINUED] QUEtiapine  (SEROQUEL ) 200 MG tablet Take 200 mg by mouth at bedtime.   [DISCONTINUED] spironolactone  (ALDACTONE ) 25 MG tablet Take 1 tablet (25 mg total) by mouth daily.   aspirin  EC 81 MG tablet Take 1 tablet (81 mg total) by mouth daily. Swallow whole.   atorvastatin  (LIPITOR) 40 MG tablet Take 1 tablet (40 mg total) by mouth daily.   busPIRone  (BUSPAR ) 10 MG tablet Take 1 tablet (10 mg total) by mouth 2 (two) times daily.   colchicine  0.6 MG tablet Daily for 7 days then daily PRN for gout flare   Diclofenac  Sodium 3 % GEL Apply 1 Application topically daily as needed.   empagliflozin  (JARDIANCE ) 10 MG TABS tablet Take 1 tablet (10 mg total) by mouth daily.   etodolac  (LODINE ) 400 MG tablet Take 1 tablet (400 mg total) by mouth 2 (two) times daily.   ferrous sulfate  325 (65 FE) MG tablet Take 1 tablet (325 mg total) by mouth every other day.   furosemide  (LASIX ) 40 MG tablet Take 1 tablet (40 mg total) by mouth 2 (two) times daily.   Insulin  Lispro Prot & Lispro (HUMALOG  MIX 75/25 KWIKPEN) (75-25) 100 UNIT/ML Kwikpen Inject 30 Units into the skin 2 (two) times daily.    metFORMIN  (GLUCOPHAGE -XR) 500 MG 24 hr tablet Take 1 tablet (500 mg total) by mouth 2 (two) times daily with a meal.   metoprolol  succinate (TOPROL -XL) 50 MG 24 hr tablet Take 1.5 tablets (75 mg total) by mouth daily.   Omega-3 Fatty Acids (FISH OIL) 1000 MG CAPS Take 1,000 mg by mouth at bedtime. (Patient not taking: Reported on 09/17/2024)   pantoprazole  (PROTONIX ) 40 MG tablet Take 1 tablet (40 mg total) by mouth daily.   QUEtiapine  (SEROQUEL ) 200 MG tablet Take 1 tablet (200 mg total) by mouth at bedtime.   sildenafil  (VIAGRA ) 100 MG tablet TAKE 1/2 TO 1 (ONE-HALF TO ONE) TABLET BY MOUTH ONCE DAILY AS NEEDED FOR ERECTILE DYSFUNCTION (Patient not taking: Reported on 09/17/2024)   spironolactone  (ALDACTONE ) 25 MG tablet Take 1 tablet (25 mg total) by mouth daily.   tiZANidine  (ZANAFLEX ) 4 MG tablet Take 1 tablet (4 mg total) by mouth  3 (three) times daily. (Patient not taking: Reported on 09/17/2024)   Varenicline  Tartrate, Starter, (CHANTIX  STARTING MONTH PAK) 0.5 MG X 11 & 1 MG X 42 TBPK Take as instructed on packaging (Patient not taking: Reported on 09/17/2024)   [DISCONTINUED] etodolac  (LODINE ) 400 MG tablet Take 1 tablet (400 mg total) by mouth 2 (two) times daily. (Patient not taking: Reported on 09/17/2024)   No facility-administered encounter medications on file as of 09/17/2024.    Review of Systems  Review of Systems  Constitutional: Negative.   HENT: Negative.    Cardiovascular: Negative.   Gastrointestinal: Negative.   Allergic/Immunologic: Negative.   Neurological: Negative.   Psychiatric/Behavioral: Negative.       Objective:   BP 132/83 (BP Location: Left Arm, Patient Position: Sitting, Cuff Size: Large)   Pulse 72   Ht 6' 2 (1.88 m)   Wt 291 lb (132 kg)   SpO2 96%   BMI 37.36 kg/m   Wt Readings from Last 5 Encounters:  09/17/24 291 lb (132 kg)  09/17/24 284 lb (128.8 kg)  08/25/24 282 lb (127.9 kg)  08/05/24 286 lb 6.4 oz (129.9 kg)  07/24/24 292 lb (132.5  kg)     Physical Exam Vitals and nursing note reviewed.  Constitutional:      General: He is not in acute distress.    Appearance: He is well-developed.  Cardiovascular:     Rate and Rhythm: Normal rate and regular rhythm.  Pulmonary:     Effort: Pulmonary effort is normal.     Breath sounds: Normal breath sounds.  Skin:    General: Skin is warm and dry.  Neurological:     Mental Status: He is alert and oriented to person, place, and time.       Assessment & Plan:   Anxiety -     busPIRone  HCl; Take 1 tablet (10 mg total) by mouth 2 (two) times daily.  Dispense: 180 tablet; Refill: 0  Type 2 diabetes mellitus with hyperglycemia, with long-term current use of insulin  (HCC) -     Insulin  Lispro Prot & Lispro; Inject 30 Units into the skin 2 (two) times daily.  Dispense: 60 mL; Refill: 3 -     metFORMIN  HCl ER; Take 1 tablet (500 mg total) by mouth 2 (two) times daily with a meal.  Dispense: 180 tablet; Refill: 3 -     Microalbumin / creatinine urine ratio -     CBC -     Comprehensive metabolic panel with GFR  Hemoglobin A1C greater than 9%, indicating poor diabetic control -     Insulin  Lispro Prot & Lispro; Inject 30 Units into the skin 2 (two) times daily.  Dispense: 60 mL; Refill: 3  Other orders -     Diclofenac  Sodium; Apply 1 Application topically daily as needed.  Dispense: 100 g; Refill: 2 -     Aspirin ; Take 1 tablet (81 mg total) by mouth daily. Swallow whole.  Dispense: 30 tablet; Refill: 2 -     Atorvastatin  Calcium ; Take 1 tablet (40 mg total) by mouth daily.  Dispense: 90 tablet; Refill: 1 -     Colchicine ; Daily for 7 days then daily PRN for gout flare  Dispense: 30 tablet; Refill: 0 -     Empagliflozin ; Take 1 tablet (10 mg total) by mouth daily.  Dispense: 90 tablet; Refill: 3 -     Etodolac ; Take 1 tablet (400 mg total) by mouth 2 (two) times daily.  Dispense: 60 tablet;  Refill: 3 -     Ferrous Sulfate ; Take 1 tablet (325 mg total) by mouth every other  day.  Dispense: 30 tablet; Refill: 1 -     Furosemide ; Take 1 tablet (40 mg total) by mouth 2 (two) times daily.  Dispense: 180 tablet; Refill: 3 -     Metoprolol  Succinate ER; Take 1.5 tablets (75 mg total) by mouth daily.  Dispense: 45 tablet; Refill: 11 -     Pantoprazole  Sodium; Take 1 tablet (40 mg total) by mouth daily.  Dispense: 90 tablet; Refill: 0 -     QUEtiapine  Fumarate; Take 1 tablet (200 mg total) by mouth at bedtime.  Dispense: 90 tablet; Refill: 2 -     Spironolactone ; Take 1 tablet (25 mg total) by mouth daily.  Dispense: 90 tablet; Refill: 3     Return in about 6 months (around 03/18/2025).   Bascom GORMAN Borer, NP 09/17/2024

## 2024-09-17 NOTE — Progress Notes (Signed)
 Paramedicine Encounter    Patient ID: [name], male    DOB: Mar 23, 1961, 63 y.o.   MRN: [id number]   Complaints- says he feels fine says he has had some on and off chest pain but not at present   Assessment- CAOX4, warm and dry   Compliance with meds- has two empty pill boxes- indicating he took his medications   Pill box filled- one week   Refills needed- will be getting bubble packs within a week   Meds changes since last visit- none     [facility]   Safety concern- drug paraphernalia (white powder substance on a plate with a blade noted in kitchen) he denies drug use.   VISIT SUMMARY- Arrived for home visit for [name] reports to be feeling okay with no complaints today. He has not been seen since last month on 9/29 due to not answering my calls- he has been not taking his meds during that time as well. I contacted [facility] to get him placed on bubble packs and they are going to get them completed this week. I did fill one pill box for one week. I advised him he had a PCP appt today at 1400- he had forgotten. I plan to come out to educate once bubble packs get delivered then discharge him if possible. Home visit complete. I will see him in one week.  CBG- 180  Wants an inspire device- says he has a referral placed to [facility] I will follow up .  BP (!) 140/70   Pulse 88   Resp 16   Wt 284 lb (128.8 kg)   SpO2 95%   BMI 35.50 kg/m  Weight yesterday-- didn't weigh  Last visit weight-- 282lbs      ACTION: Home visit completed     Patient Care Team: Oley Bascom RAMAN, NP as PCP - General (Pulmonary Disease) Shlomo Wilbert SAUNDERS, MD as PCP - Cardiology (Cardiology) [name] Ezra RAMAN, MD as PCP - Advanced Heart Failure (Cardiology) Cindie Ole DASEN, MD as PCP - Electrophysiology (Cardiology) Cathern Andriette DEL, LCSW as Social Worker (Licensed Clinical Social Worker) Eda Iha, MD (Inactive) as  Consulting Physician (Gastroenterology) Brinda Lorain SQUIBB, [facility] (Pharmacist)  Patient Active Problem List   Diagnosis Date Noted   PVC's (premature ventricular contractions) 09/28/2023   Acute right-sided low back pain with right-sided sciatica 05/14/2023   Multiple joint pain 12/22/2022   Obesity (BMI 35.0-39.9 without comorbidity) 07/18/2022   Cellulitis 06/22/2022   Cellulitis, leg 06/20/2022   Presence of Watchman left atrial appendage closure device 10/28/2021   Atrial fibrillation (HCC) 10/27/2021   Melena    Chronic diastolic CHF (congestive heart failure) (HCC)    GI bleed 06/29/2021   Severe anemia 06/29/2021   Adenomatous polyp of ascending colon    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   Acute blood loss anemia    Angiodysplasia of stomach    Chronic anticoagulation    CHF exacerbation (HCC) 06/15/2020   AKI (acute kidney injury) 06/15/2020   CKD (chronic kidney disease), stage III (HCC) 06/15/2020   Anemia due to chronic blood loss    Gastric AVM    Angiodysplasia of duodenum with hemorrhage    Chronic systolic CHF (congestive heart failure) (HCC) 04/05/2020   Anemia 04/05/2020   PAF (paroxysmal atrial fibrillation) (HCC) 04/05/2020   OSA (obstructive sleep apnea) 04/05/2020   Syncope and collapse 04/05/2020   Type 2 diabetes mellitus with hyperglycemia, with  long-term current use of insulin  (HCC) 12/03/2019   Diabetic polyneuropathy associated with type 2 diabetes mellitus (HCC) 12/03/2019   Erectile dysfunction 12/03/2019   Paranoid schizophrenia, chronic condition (HCC) 01/26/2015   Severe recurrent major depressive disorder with psychotic features (HCC) 01/26/2015   GAD (generalized anxiety disorder) 01/26/2015   OCD (obsessive compulsive disorder) 01/26/2015   Panic disorder without agoraphobia 01/26/2015   Insomnia 01/26/2015    Current Outpatient Medications:    acetaminophen  (TYLENOL ) 500 MG tablet, Take 2 tablets (1,000 mg total)  by mouth 2 (two) times daily as needed., Disp: 180 tablet, Rfl: 3   albuterol  (VENTOLIN  HFA) 108 (90 Base) MCG/ACT inhaler, Inhale 2 puffs into the lungs every 6 (six) hours as needed for wheezing or shortness of breath., Disp: 18 g, Rfl: 1   aspirin  EC 81 MG tablet, Take 81 mg by mouth daily. Swallow whole., Disp: , Rfl:    atorvastatin  (LIPITOR) 40 MG tablet, Take 1 tablet (40 mg total) by mouth daily., Disp: 90 tablet, Rfl: 1   blood glucose meter kit and supplies KIT, Dispense based on patient and insurance preference. Use up to four times daily as directed. (FOR ICD-9 250.00, 250.01)., Disp: 1 each, Rfl: 0   busPIRone  (BUSPAR ) 10 MG tablet, Take 1 tablet (10 mg total) by mouth 2 (two) times daily., Disp: 180 tablet, Rfl: 0   colchicine  0.6 MG tablet, Daily for 7 days then daily PRN for gout flare, Disp: 30 tablet, Rfl: 0   Continuous Glucose Receiver (FREESTYLE LIBRE 3 READER) DEVI, 1 each by Does not apply route daily. Use to check glucose continuously, Disp: 1 each, Rfl: 1   Continuous Glucose Sensor (FREESTYLE LIBRE 3 PLUS SENSOR) MISC, Change sensor every 15 days., Disp: 6 each, Rfl: 3   diclofenac  Sodium (VOLTAREN ) 1 % GEL, Apply 2 g topically daily as needed., Disp: 150 g, Rfl: 2   Diclofenac  Sodium 3 % GEL, Apply 1 Application topically daily as needed., Disp: 100 g, Rfl: 2   empagliflozin  (JARDIANCE ) 10 MG TABS tablet, Take 1 tablet (10 mg total) by mouth daily., Disp: 90 tablet, Rfl: 3   ferrous sulfate  325 (65 FE) MG tablet, Take 1 tablet (325 mg total) by mouth every other day., Disp: 30 tablet, Rfl: 1   furosemide  (LASIX ) 40 MG tablet, Take 1 tablet (40 mg total) by mouth 2 (two) times daily., Disp: 180 tablet, Rfl: 3   gabapentin  (NEURONTIN ) 800 MG tablet, Take 1 tablet (800 mg total) by mouth 3 (three) times daily., Disp: 90 tablet, Rfl: 0   Insulin  Lispro Prot & Lispro (HUMALOG  MIX 75/25 KWIKPEN) (75-25) 100 UNIT/ML Kwikpen, Inject 30 Units into the skin 2 (two) times daily., Disp:  60 mL, Rfl: 3   metFORMIN  (GLUCOPHAGE -XR) 500 MG 24 hr tablet, Take 1 tablet (500 mg total) by mouth 2 (two) times daily with a meal., Disp: 180 tablet, Rfl: 3   metoprolol  succinate (TOPROL -XL) 50 MG 24 hr tablet, Take 1.5 tablets (75 mg total) by mouth daily., Disp: 45 tablet, Rfl: 11   Multiple Vitamins-Minerals (CENTRUM SILVER 50+MEN) TABS, Take 1 tablet by mouth daily., Disp: , Rfl:    pantoprazole  (PROTONIX ) 40 MG tablet, Take 1 tablet by mouth once daily, Disp: 90 tablet, Rfl: 0   potassium chloride  SA (KLOR-CON  M) 20 MEQ tablet, Take 2 tablets (40 mEq total) by mouth daily., Disp: 60 tablet, Rfl: 6   QUEtiapine  (SEROQUEL ) 200 MG tablet, Take 200 mg by mouth at bedtime., Disp: , Rfl:  RELION PEN NEEDLES 31G X 6 MM MISC, INJECT 1 PEN INTO THE SKIN 2 TIMES DAILY, Disp: 100 each, Rfl: 0   sacubitril -valsartan  (ENTRESTO ) 24-26 MG, Take 1 tablet by mouth 2 (two) times daily., Disp: 180 tablet, Rfl: 1   Semaglutide , 2 MG/DOSE, (OZEMPIC , 2 MG/DOSE,) 8 MG/3ML SOPN, Inject 2 mg into the skin once a week. Monday, Disp: 3 mL, Rfl: 2   spironolactone  (ALDACTONE ) 25 MG tablet, Take 1 tablet (25 mg total) by mouth daily., Disp: 90 tablet, Rfl: 3   etodolac  (LODINE ) 400 MG tablet, Take 1 tablet (400 mg total) by mouth 2 (two) times daily. (Patient not taking: Reported on 09/17/2024), Disp: 60 tablet, Rfl: 3   Omega-3 Fatty Acids (FISH OIL) 1000 MG CAPS, Take 1,000 mg by mouth at bedtime. (Patient not taking: Reported on 09/17/2024), Disp: , Rfl:    sildenafil  (VIAGRA ) 100 MG tablet, TAKE 1/2 TO 1 (ONE-HALF TO ONE) TABLET BY MOUTH ONCE DAILY AS NEEDED FOR ERECTILE DYSFUNCTION (Patient not taking: Reported on 09/17/2024), Disp: 5 tablet, Rfl: 0   tiZANidine  (ZANAFLEX ) 4 MG tablet, Take 1 tablet (4 mg total) by mouth 3 (three) times daily. (Patient not taking: Reported on 09/17/2024), Disp: 90 tablet, Rfl: 1   Varenicline  Tartrate, Starter, (CHANTIX  STARTING MONTH PAK) 0.5 MG X 11 & 1 MG X 42 TBPK, Take as  instructed on packaging (Patient not taking: Reported on 09/17/2024), Disp: 1 each, Rfl: 0 No Known Allergies   Social History   Socioeconomic History   Marital status: Legally Separated    Spouse name: Not on file   Number of children: Not on file   Years of education: Not on file   Highest education level: Not on file  Occupational History   Not on file  Tobacco Use   Smoking status: Never    Passive exposure: Never   Smokeless tobacco: Never  Vaping Use   Vaping status: Never Used  Substance and Sexual Activity   Alcohol use: Not Currently    Alcohol/week: 2.0 standard drinks of alcohol    Types: 2 Cans of beer per week   Drug use: No   Sexual activity: Yes    Birth control/protection: None  Other Topics Concern   Not on file  Social History Narrative   Not on file   Social Drivers of Health   Financial Resource Strain: Medium Risk (05/24/2023)   Overall Financial Resource Strain (CARDIA)    Difficulty of Paying Living Expenses: Somewhat hard  Food Insecurity: No Food Insecurity (02/04/2024)   Hunger Vital Sign    Worried About Running Out of Food in the Last Year: Never true    Ran Out of Food in the Last Year: Never true  Transportation Needs: No Transportation Needs (05/24/2023)   PRAPARE - Administrator, Civil Service (Medical): No    Lack of Transportation (Non-Medical): No  Physical Activity: Sufficiently Active (05/24/2023)   Exercise Vital Sign    Days of Exercise per Week: 7 days    Minutes of Exercise per Session: 30 min  Stress: No Stress Concern Present (05/24/2023)   [employer] of Occupational Health - Occupational Stress Questionnaire    Feeling of Stress : Not at all  Social Connections: Moderately Isolated (05/24/2023)   Social Connection and Isolation Panel    Frequency of Communication with Friends and Family: More than three times a week    Frequency of Social Gatherings with Friends and Family: More than three times a week  Attends Religious Services: Never    Active Member of Clubs or Organizations: No    Attends Banker Meetings: Never    Marital Status: Living with partner  Intimate Partner Violence: Not At Risk (05/24/2023)   Humiliation, Afraid, Rape, and Kick questionnaire    Fear of Current or Ex-Partner: No    Emotionally Abused: No    Physically Abused: No    Sexually Abused: No    Physical Exam      Future Appointments  Date Time Provider Department Center  09/17/2024  2:00 PM Oley Bascom RAMAN, NP [facility]  12/05/2024  7:00 AM [facility] HVT DEVICE REMOTES [facility]  03/06/2025  7:00 AM [facility] HVT DEVICE REMOTES [facility]  06/05/2025  7:00 AM [facility] HVT DEVICE REMOTES [facility]

## 2024-09-17 NOTE — Telephone Encounter (Signed)
 Please advise [facility]

## 2024-09-18 ENCOUNTER — Ambulatory Visit: Payer: Self-pay | Admitting: Nurse Practitioner

## 2024-09-18 LAB — COMPREHENSIVE METABOLIC PANEL WITH GFR
ALT: 23 IU/L (ref 0–44)
AST: 20 IU/L (ref 0–40)
Albumin: 3.7 g/dL — ABNORMAL LOW (ref 3.9–4.9)
Alkaline Phosphatase: 87 IU/L (ref 47–123)
BUN/Creatinine Ratio: 14 (ref 10–24)
BUN: 14 mg/dL (ref 8–27)
Bilirubin Total: 0.3 mg/dL (ref 0.0–1.2)
CO2: 22 mmol/L (ref 20–29)
Calcium: 9.1 mg/dL (ref 8.6–10.2)
Chloride: 103 mmol/L (ref 96–106)
Creatinine, Ser: 1.03 mg/dL (ref 0.76–1.27)
Globulin, Total: 3.1 g/dL (ref 1.5–4.5)
Glucose: 102 mg/dL — ABNORMAL HIGH (ref 70–99)
Potassium: 4.6 mmol/L (ref 3.5–5.2)
Sodium: 138 mmol/L (ref 134–144)
Total Protein: 6.8 g/dL (ref 6.0–8.5)
eGFR: 81 mL/min/1.73 (ref >59)

## 2024-09-18 LAB — CBC
Hematocrit: 47.6 % (ref 37.5–51.0)
Hemoglobin: 14.6 g/dL (ref 13.0–17.7)
MCH: 27 pg (ref 26.6–33.0)
MCHC: 30.7 g/dL — ABNORMAL LOW (ref 31.5–35.7)
MCV: 88 fL (ref 79–97)
Platelets: 221 x10E3/uL (ref 150–450)
RBC: 5.4 x10E6/uL (ref 4.14–5.80)
RDW: 13.3 % (ref 11.6–15.4)
WBC: 6.8 x10E3/uL (ref 3.4–10.8)

## 2024-09-18 LAB — MICROALBUMIN / CREATININE URINE RATIO
Creatinine, Urine: 200 mg/dL
Microalb/Creat Ratio: 18 mg/g{creat} (ref 0–29)
Microalbumin, Urine: 35.8 ug/mL

## 2024-09-22 ENCOUNTER — Other Ambulatory Visit: Payer: Self-pay | Admitting: Nurse Practitioner

## 2024-09-22 ENCOUNTER — Telehealth (HOSPITAL_COMMUNITY): Payer: Self-pay

## 2024-09-22 DIAGNOSIS — Z794 Long term (current) use of insulin: Secondary | ICD-10-CM

## 2024-09-22 DIAGNOSIS — G6289 Other specified polyneuropathies: Secondary | ICD-10-CM

## 2024-09-22 MED ORDER — GABAPENTIN 800 MG PO TABS: 800.0000 mg | ORAL_TABLET | Freq: Three times a day (TID) | ORAL | 0 refills | Status: DC

## 2024-09-22 NOTE — Telephone Encounter (Signed)
 [facility] states they never received his refills for his Gabapentin  800mg  TID.   Please send as soon as possible as they are filling bubble packs for patient.   Thanks.   [name], EMT-Paramedic [phone] 09/22/2024

## 2024-09-22 NOTE — Telephone Encounter (Signed)
 HF Paramedicine Message to [facility]  Pharmacy (if applicable): [facility]    Issue/reason for call: Needs Lasix  60mg  [facility]   Medication refill? Yes lasix  60mg     [name], EMT-Paramedic [phone] 09/22/2024

## 2024-09-23 ENCOUNTER — Other Ambulatory Visit (HOSPITAL_COMMUNITY): Payer: Self-pay

## 2024-09-23 MED ORDER — FUROSEMIDE 40 MG PO TABS: 40.0000 mg | ORAL_TABLET | Freq: Two times a day (BID) | ORAL | 3 refills | Status: DC

## 2024-09-24 ENCOUNTER — Other Ambulatory Visit (HOSPITAL_COMMUNITY): Payer: Self-pay

## 2024-09-24 NOTE — Progress Notes (Signed)
 Paramedicine Encounter    Patient ID: [name], male    DOB: Oct 05, 1961, 63 y.o.   MRN: [id number]   Complaints- chest pain, shortness of breath   Assessment- CAOX4, sweating profusely, cool to touch, left chest and neck pain, short of breath on exertion, EKG abnormal with PAC's and bundle branch block along with some elevation in leads 2, and avf  CBG- 114   Compliance with meds-missed two night time doses   Pill box filled- bubble packs   Refills needed- none   Meds changes since last visit- none     Social changes- none    VISIT SUMMARY- Arrived for home visit for [name] who reports to be feeling okay but is very cool and diaphoretic, stating he is having some mild left chest and neck pain,appears short of breath on exertion. Vitals obtained and 12 lead obtained. 12 lead showing changes from last one taken in November last year- has not seen cards since 2/25. He claims he took some pills for parasites- upon looking at them they have no label- they were given to him by a friend, they look like THC pills, he denied any drug use, admits to some alcohol use last Friday, says he's had a cold and been taking mucinex  and nyquil. I advised him I would be more comfortable if he would go to ER and he adamantly refused. I strongly encouraged it multiple times and he continued to refuse. His skin did improve slightly about 20 mins into our visit but EKG still showing changes. He continued to refuse transport to ER. I obtained his bubble packs today and educated him on how to take same. (Lasix  dose is 40mg  BID in the bubble packs however [name] has him on 60mg  BID per his note in [place]. I messaged triage about same) I cut half's for him to take in addition to his bubble packs until they are packed correctly. I continued to stress the ER and how important it is to be seen- he continued refusing. Home visit complete I will follow up with a phone call tomorrow.   BP (!) 140/68   Pulse 74   Temp  98 F (36.7 C) (Temporal)   Resp 20   Wt 276 lb (125.2 kg)   SpO2 98%   BMI 35.44 kg/m  Weight yesterday-- didn't weigh  Last visit weight-- 291lbs      ACTION: Home visit completed     Patient Care Team: Oley Bascom RAMAN, NP as PCP - General (Pulmonary Disease) Shlomo Wilbert SAUNDERS, MD as PCP - Cardiology (Cardiology) [name] Ezra RAMAN, MD as PCP - Advanced Heart Failure (Cardiology) Cindie Ole DASEN, MD as PCP - Electrophysiology (Cardiology) Cathern Andriette DEL, LCSW as Social Worker (Licensed Clinical Social Worker) Eda Iha, MD (Inactive) as Consulting Physician (Gastroenterology) Brinda Lorain SQUIBB, [facility] (Pharmacist)  Patient Active Problem List   Diagnosis Date Noted   PVC's (premature ventricular contractions) 09/28/2023   Acute right-sided low back pain with right-sided sciatica 05/14/2023   Multiple joint pain 12/22/2022   Obesity (BMI 35.0-39.9 without comorbidity) 07/18/2022   Cellulitis 06/22/2022   Cellulitis, leg 06/20/2022   Presence of Watchman left atrial appendage closure device 10/28/2021   Atrial fibrillation (HCC) 10/27/2021   Melena    Chronic diastolic CHF (congestive heart failure) (HCC)    GI bleed 06/29/2021   Severe anemia 06/29/2021   Adenomatous polyp of ascending colon    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   Acute blood loss anemia  Angiodysplasia of stomach    Chronic anticoagulation    CHF exacerbation (HCC) 06/15/2020   AKI (acute kidney injury) 06/15/2020   CKD (chronic kidney disease), stage III (HCC) 06/15/2020   Anemia due to chronic blood loss    Gastric AVM    Angiodysplasia of duodenum with hemorrhage    Chronic systolic CHF (congestive heart failure) (HCC) 04/05/2020   Anemia 04/05/2020   PAF (paroxysmal atrial fibrillation) (HCC) 04/05/2020   OSA (obstructive sleep apnea) 04/05/2020   Syncope and collapse 04/05/2020   Type 2 diabetes mellitus with hyperglycemia, with long-term current use of  insulin  (HCC) 12/03/2019   Diabetic polyneuropathy associated with type 2 diabetes mellitus (HCC) 12/03/2019   Erectile dysfunction 12/03/2019   Paranoid schizophrenia, chronic condition (HCC) 01/26/2015   Severe recurrent major depressive disorder with psychotic features (HCC) 01/26/2015   GAD (generalized anxiety disorder) 01/26/2015   OCD (obsessive compulsive disorder) 01/26/2015   Panic disorder without agoraphobia 01/26/2015   Insomnia 01/26/2015    Current Outpatient Medications:    acetaminophen  (TYLENOL ) 500 MG tablet, Take 2 tablets (1,000 mg total) by mouth 2 (two) times daily as needed., Disp: 180 tablet, Rfl: 3   albuterol  (VENTOLIN  HFA) 108 (90 Base) MCG/ACT inhaler, Inhale 2 puffs into the lungs every 6 (six) hours as needed for wheezing or shortness of breath., Disp: 18 g, Rfl: 1   aspirin  EC 81 MG tablet, Take 1 tablet (81 mg total) by mouth daily. Swallow whole., Disp: 30 tablet, Rfl: 2   atorvastatin  (LIPITOR) 40 MG tablet, Take 1 tablet (40 mg total) by mouth daily., Disp: 90 tablet, Rfl: 1   blood glucose meter kit and supplies KIT, Dispense based on patient and insurance preference. Use up to four times daily as directed. (FOR ICD-9 250.00, 250.01)., Disp: 1 each, Rfl: 0   busPIRone  (BUSPAR ) 10 MG tablet, Take 1 tablet (10 mg total) by mouth 2 (two) times daily., Disp: 180 tablet, Rfl: 0   colchicine  0.6 MG tablet, Daily for 7 days then daily PRN for gout flare, Disp: 30 tablet, Rfl: 0   Continuous Glucose Receiver (FREESTYLE LIBRE 3 READER) DEVI, 1 each by Does not apply route daily. Use to check glucose continuously, Disp: 1 each, Rfl: 1   Continuous Glucose Sensor (FREESTYLE LIBRE 3 PLUS SENSOR) MISC, Change sensor every 15 days., Disp: 6 each, Rfl: 3   diclofenac  Sodium (VOLTAREN ) 1 % GEL, Apply 2 g topically daily as needed., Disp: 150 g, Rfl: 2   Diclofenac  Sodium 3 % GEL, Apply 1 Application topically daily as needed., Disp: 100 g, Rfl: 2   empagliflozin   (JARDIANCE ) 10 MG TABS tablet, Take 1 tablet (10 mg total) by mouth daily., Disp: 90 tablet, Rfl: 3   etodolac  (LODINE ) 400 MG tablet, Take 1 tablet (400 mg total) by mouth 2 (two) times daily., Disp: 60 tablet, Rfl: 3   ferrous sulfate  325 (65 FE) MG tablet, Take 1 tablet (325 mg total) by mouth every other day., Disp: 30 tablet, Rfl: 1   furosemide  (LASIX ) 40 MG tablet, Take 1 tablet (40 mg total) by mouth 2 (two) times daily., Disp: 180 tablet, Rfl: 3   gabapentin  (NEURONTIN ) 800 MG tablet, Take 1 tablet (800 mg total) by mouth 3 (three) times daily., Disp: 90 tablet, Rfl: 0   Insulin  Lispro Prot & Lispro (HUMALOG  MIX 75/25 KWIKPEN) (75-25) 100 UNIT/ML Kwikpen, Inject 30 Units into the skin 2 (two) times daily., Disp: 60 mL, Rfl: 3   metFORMIN  (GLUCOPHAGE -XR) 500 MG  24 hr tablet, Take 1 tablet (500 mg total) by mouth 2 (two) times daily with a meal., Disp: 180 tablet, Rfl: 3   metoprolol  succinate (TOPROL -XL) 50 MG 24 hr tablet, Take 1.5 tablets (75 mg total) by mouth daily., Disp: 45 tablet, Rfl: 11   Multiple Vitamins-Minerals (CENTRUM SILVER 50+MEN) TABS, Take 1 tablet by mouth daily., Disp: , Rfl:    Omega-3 Fatty Acids (FISH OIL) 1000 MG CAPS, Take 1,000 mg by mouth at bedtime. (Patient not taking: Reported on 09/17/2024), Disp: , Rfl:    pantoprazole  (PROTONIX ) 40 MG tablet, Take 1 tablet (40 mg total) by mouth daily., Disp: 90 tablet, Rfl: 0   potassium chloride  SA (KLOR-CON  M) 20 MEQ tablet, Take 2 tablets (40 mEq total) by mouth daily., Disp: 60 tablet, Rfl: 6   QUEtiapine  (SEROQUEL ) 200 MG tablet, Take 1 tablet (200 mg total) by mouth at bedtime., Disp: 90 tablet, Rfl: 2   RELION PEN NEEDLES 31G X 6 MM MISC, INJECT 1 PEN INTO THE SKIN 2 TIMES DAILY, Disp: 100 each, Rfl: 0   sacubitril -valsartan  (ENTRESTO ) 24-26 MG, Take 1 tablet by mouth 2 (two) times daily., Disp: 180 tablet, Rfl: 1   Semaglutide , 2 MG/DOSE, (OZEMPIC , 2 MG/DOSE,) 8 MG/3ML SOPN, Inject 2 mg into the skin once a week.  Monday, Disp: 3 mL, Rfl: 2   sildenafil  (VIAGRA ) 100 MG tablet, TAKE 1/2 TO 1 (ONE-HALF TO ONE) TABLET BY MOUTH ONCE DAILY AS NEEDED FOR ERECTILE DYSFUNCTION (Patient not taking: Reported on 09/17/2024), Disp: 5 tablet, Rfl: 0   spironolactone  (ALDACTONE ) 25 MG tablet, Take 1 tablet (25 mg total) by mouth daily., Disp: 90 tablet, Rfl: 3   tiZANidine  (ZANAFLEX ) 4 MG tablet, Take 1 tablet (4 mg total) by mouth 3 (three) times daily. (Patient not taking: Reported on 09/17/2024), Disp: 90 tablet, Rfl: 1   Varenicline  Tartrate, Starter, (CHANTIX  STARTING MONTH PAK) 0.5 MG X 11 & 1 MG X 42 TBPK, Take as instructed on packaging (Patient not taking: Reported on 09/17/2024), Disp: 1 each, Rfl: 0 No Known Allergies   Social History   Socioeconomic History   Marital status: Legally Separated    Spouse name: Not on file   Number of children: Not on file   Years of education: Not on file   Highest education level: Not on file  Occupational History   Not on file  Tobacco Use   Smoking status: Never    Passive exposure: Never   Smokeless tobacco: Never  Vaping Use   Vaping status: Never Used  Substance and Sexual Activity   Alcohol use: Not Currently    Alcohol/week: 2.0 standard drinks of alcohol    Types: 2 Cans of beer per week   Drug use: No   Sexual activity: Yes    Birth control/protection: None  Other Topics Concern   Not on file  Social History Narrative   Not on file   Social Drivers of Health   Financial Resource Strain: Medium Risk (05/24/2023)   Overall Financial Resource Strain (CARDIA)    Difficulty of Paying Living Expenses: Somewhat hard  Food Insecurity: No Food Insecurity (02/04/2024)   Hunger Vital Sign    Worried About Running Out of Food in the Last Year: Never true    Ran Out of Food in the Last Year: Never true  Transportation Needs: No Transportation Needs (05/24/2023)   PRAPARE - Administrator, Civil Service (Medical): No    Lack of Transportation  (Non-Medical): No  Physical Activity: Sufficiently Active (05/24/2023)   Exercise Vital Sign    Days of Exercise per Week: 7 days    Minutes of Exercise per Session: 30 min  Stress: No Stress Concern Present (05/24/2023)   [employer] of Occupational Health - Occupational Stress Questionnaire    Feeling of Stress : Not at all  Social Connections: Moderately Isolated (05/24/2023)   Social Connection and Isolation Panel    Frequency of Communication with Friends and Family: More than three times a week    Frequency of Social Gatherings with Friends and Family: More than three times a week    Attends Religious Services: Never    Database Administrator or Organizations: No    Attends Banker Meetings: Never    Marital Status: Living with partner  Intimate Partner Violence: Not At Risk (05/24/2023)   Humiliation, Afraid, Rape, and Kick questionnaire    Fear of Current or Ex-Partner: No    Emotionally Abused: No    Physically Abused: No    Sexually Abused: No    Physical Exam      Future Appointments  Date Time Provider Department Center  11/04/2024  2:00 PM [facility] PHARMACIST [facility]  12/05/2024  7:00 AM [facility] HVT DEVICE REMOTES [facility]  03/06/2025  7:00 AM [facility] HVT DEVICE REMOTES [facility]  03/18/2025  2:00 PM Oley Bascom RAMAN, NP [facility]  06/05/2025  7:00 AM [facility] HVT DEVICE REMOTES [facility]

## 2024-09-25 ENCOUNTER — Other Ambulatory Visit: Payer: Self-pay | Admitting: Nurse Practitioner

## 2024-09-25 DIAGNOSIS — Z794 Long term (current) use of insulin: Secondary | ICD-10-CM

## 2024-09-25 DIAGNOSIS — G6289 Other specified polyneuropathies: Secondary | ICD-10-CM

## 2024-09-25 MED ORDER — FUROSEMIDE 20 MG PO TABS: 60.0000 mg | ORAL_TABLET | Freq: Two times a day (BID) | ORAL | 3 refills | Status: DC

## 2024-09-25 NOTE — Telephone Encounter (Signed)
 Please advise [facility]

## 2024-09-25 NOTE — Addendum Note (Signed)
 Addended by: Jilliam Bellmore, [name] HERO on: 09/25/2024 10:39 AM   Modules accepted: Orders

## 2024-10-06 ENCOUNTER — Telehealth: Payer: Self-pay

## 2024-10-06 ENCOUNTER — Telehealth: Payer: Self-pay | Admitting: Pharmacy Technician

## 2024-10-06 NOTE — Telephone Encounter (Signed)
Alert received from [employer]:

## 2024-10-06 NOTE — Telephone Encounter (Addendum)
 Hi, [facility] needs the refill form for ozempic  signed by the provider -I scanned it in media if someone can please get the provider to sign and fax back to me at [phone] and I can fill out the rest and send it in. Thank you!   Ozempic  2 MG/DOSE- 8 MG/3ML SOPN

## 2024-10-09 NOTE — Telephone Encounter (Signed)
 Unable to reach pt, sent pt a mychart message.

## 2024-10-15 ENCOUNTER — Other Ambulatory Visit (HOSPITAL_COMMUNITY): Payer: Self-pay

## 2024-10-15 ENCOUNTER — Other Ambulatory Visit: Payer: Self-pay | Admitting: Nurse Practitioner

## 2024-10-15 ENCOUNTER — Telehealth (HOSPITAL_COMMUNITY): Payer: Self-pay

## 2024-10-15 DIAGNOSIS — E1165 Type 2 diabetes mellitus with hyperglycemia: Secondary | ICD-10-CM

## 2024-10-15 DIAGNOSIS — G6289 Other specified polyneuropathies: Secondary | ICD-10-CM

## 2024-10-15 DIAGNOSIS — G4733 Obstructive sleep apnea (adult) (pediatric): Secondary | ICD-10-CM

## 2024-10-15 NOTE — Telephone Encounter (Signed)
 Please advise [facility]

## 2024-10-15 NOTE — Progress Notes (Signed)
 Paramedicine Encounter    Patient ID: [name], male    DOB: 03-19-1961, 63 y.o.   MRN: [id number]   Complaints- none   Assessment- CAOx4, warm and dry ambulatory in his home with no complaints. He denied any recent chest pain, dizziness or shortness of breath, no swelling. CBG- 132   Compliance with meds- not 100%- he has missed a few doses, using bubble packs, his granddaughter got into them and broke open the blister packs and he was unsure how to take them- I resconstructed them into a pill box until [facility] can send out next four weeks of same.   Pill box filled- see note above   Refills needed- see note above (gabapentin  waiting on PCP to send for bubble packs)   Meds changes since last visit- none     Social changes- denied any recent drug or alcohol use.    VISIT SUMMARY- Arrived for home visit for [name] who reports to be feeling well with no complaints. Assessment and vitals obtained. I reviewed meds and had to reconstruct a pill box for him for four days with the meds he had until his next pack of bubble packs are delivered. We scheduled and reviewed upcoming appointments. I wrote down same. Home visit complete. We will follow up next week to ensure he got his bubble packs then home visit next month. 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   BP (!) 140/68   Pulse 66   Resp 16   Wt 275 lb 9.6 oz (125 kg)   SpO2 94%   BMI 35.38 kg/m  Weight yesterday-- didn't weigh  Last visit weight-- 276lbs      ACTION: Home visit completed     Patient Care Team: Oley Bascom RAMAN, NP as PCP - General (Pulmonary Disease) Shlomo Wilbert SAUNDERS, MD as PCP - Cardiology (Cardiology) [name] Ezra RAMAN, MD as PCP - Advanced Heart Failure (Cardiology) Cindie Ole DASEN, MD as PCP - Electrophysiology (Cardiology) Cathern Andriette DEL, LCSW as Social Worker (Licensed Clinical Social Worker) Eda Iha, MD (Inactive) as Consulting Physician (Gastroenterology) Brinda Lorain SQUIBB,  [facility] (Pharmacist)  Patient Active Problem List   Diagnosis Date Noted   PVC's (premature ventricular contractions) 09/28/2023   Acute right-sided low back pain with right-sided sciatica 05/14/2023   Multiple joint pain 12/22/2022   Obesity (BMI 35.0-39.9 without comorbidity) 07/18/2022   Cellulitis 06/22/2022   Cellulitis, leg 06/20/2022   Presence of Watchman left atrial appendage closure device 10/28/2021   Atrial fibrillation (HCC) 10/27/2021   Melena    Chronic diastolic CHF (congestive heart failure) (HCC)    GI bleed 06/29/2021   Severe anemia 06/29/2021   Adenomatous polyp of ascending colon    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   Acute blood loss anemia    Angiodysplasia of stomach    Chronic anticoagulation    CHF exacerbation (HCC) 06/15/2020   AKI (acute kidney injury) 06/15/2020   CKD (chronic kidney disease), stage III (HCC) 06/15/2020   Anemia due to chronic blood loss    Gastric AVM    Angiodysplasia of duodenum with hemorrhage    Chronic systolic CHF (congestive heart failure) (HCC) 04/05/2020   Anemia 04/05/2020   PAF (paroxysmal atrial fibrillation) (HCC) 04/05/2020   OSA (obstructive sleep apnea) 04/05/2020   Syncope and collapse 04/05/2020   Type 2 diabetes mellitus with hyperglycemia, with long-term current use of insulin  (HCC) 12/03/2019   Diabetic polyneuropathy associated with type 2 diabetes mellitus (HCC) 12/03/2019   Erectile dysfunction  12/03/2019   Paranoid schizophrenia, chronic condition (HCC) 01/26/2015   Severe recurrent major depressive disorder with psychotic features (HCC) 01/26/2015   GAD (generalized anxiety disorder) 01/26/2015   OCD (obsessive compulsive disorder) 01/26/2015   Panic disorder without agoraphobia 01/26/2015   Insomnia 01/26/2015    Current Outpatient Medications:    acetaminophen  (TYLENOL ) 500 MG tablet, Take 2 tablets (1,000 mg total) by mouth 2 (two) times daily as needed., Disp: 180  tablet, Rfl: 3   albuterol  (VENTOLIN  HFA) 108 (90 Base) MCG/ACT inhaler, Inhale 2 puffs into the lungs every 6 (six) hours as needed for wheezing or shortness of breath., Disp: 18 g, Rfl: 1   aspirin  EC 81 MG tablet, Take 1 tablet (81 mg total) by mouth daily. Swallow whole., Disp: 30 tablet, Rfl: 2   atorvastatin  (LIPITOR) 40 MG tablet, Take 1 tablet (40 mg total) by mouth daily., Disp: 90 tablet, Rfl: 1   blood glucose meter kit and supplies KIT, Dispense based on patient and insurance preference. Use up to four times daily as directed. (FOR ICD-9 250.00, 250.01)., Disp: 1 each, Rfl: 0   busPIRone  (BUSPAR ) 10 MG tablet, Take 1 tablet (10 mg total) by mouth 2 (two) times daily., Disp: 180 tablet, Rfl: 0   colchicine  0.6 MG tablet, Daily for 7 days then daily PRN for gout flare, Disp: 30 tablet, Rfl: 0   Continuous Glucose Receiver (FREESTYLE LIBRE 3 READER) DEVI, 1 each by Does not apply route daily. Use to check glucose continuously, Disp: 1 each, Rfl: 1   Continuous Glucose Sensor (FREESTYLE LIBRE 3 PLUS SENSOR) MISC, Change sensor every 15 days., Disp: 6 each, Rfl: 3   diclofenac  Sodium (VOLTAREN ) 1 % GEL, Apply 2 g topically daily as needed., Disp: 150 g, Rfl: 2   Diclofenac  Sodium 3 % GEL, Apply 1 Application topically daily as needed., Disp: 100 g, Rfl: 2   empagliflozin  (JARDIANCE ) 10 MG TABS tablet, Take 1 tablet (10 mg total) by mouth daily., Disp: 90 tablet, Rfl: 3   etodolac  (LODINE ) 400 MG tablet, Take 1 tablet (400 mg total) by mouth 2 (two) times daily., Disp: 60 tablet, Rfl: 3   ferrous sulfate  325 (65 FE) MG tablet, Take 1 tablet (325 mg total) by mouth every other day., Disp: 30 tablet, Rfl: 1   furosemide  (LASIX ) 20 MG tablet, Take 3 tablets (60 mg total) by mouth 2 (two) times daily., Disp: 180 tablet, Rfl: 3   gabapentin  (NEURONTIN ) 800 MG tablet, Take 1 tablet (800 mg total) by mouth 3 (three) times daily., Disp: 90 tablet, Rfl: 0   Insulin  Lispro Prot & Lispro (HUMALOG  MIX  75/25 KWIKPEN) (75-25) 100 UNIT/ML Kwikpen, Inject 30 Units into the skin 2 (two) times daily., Disp: 60 mL, Rfl: 3   metFORMIN  (GLUCOPHAGE -XR) 500 MG 24 hr tablet, Take 1 tablet (500 mg total) by mouth 2 (two) times daily with a meal., Disp: 180 tablet, Rfl: 3   metoprolol  succinate (TOPROL -XL) 50 MG 24 hr tablet, Take 1.5 tablets (75 mg total) by mouth daily., Disp: 45 tablet, Rfl: 11   Multiple Vitamins-Minerals (CENTRUM SILVER 50+MEN) TABS, Take 1 tablet by mouth daily., Disp: , Rfl:    pantoprazole  (PROTONIX ) 40 MG tablet, Take 1 tablet (40 mg total) by mouth daily., Disp: 90 tablet, Rfl: 0   potassium chloride  SA (KLOR-CON  M) 20 MEQ tablet, Take 2 tablets (40 mEq total) by mouth daily., Disp: 60 tablet, Rfl: 6   QUEtiapine  (SEROQUEL ) 200 MG tablet, Take 1 tablet (200  mg total) by mouth at bedtime., Disp: 90 tablet, Rfl: 2   RELION PEN NEEDLES 31G X 6 MM MISC, INJECT 1 PEN INTO THE SKIN 2 TIMES DAILY, Disp: 100 each, Rfl: 0   sacubitril -valsartan  (ENTRESTO ) 24-26 MG, Take 1 tablet by mouth 2 (two) times daily., Disp: 180 tablet, Rfl: 1   Semaglutide , 2 MG/DOSE, (OZEMPIC , 2 MG/DOSE,) 8 MG/3ML SOPN, Inject 2 mg into the skin once a week. Monday, Disp: 3 mL, Rfl: 2   sildenafil  (VIAGRA ) 100 MG tablet, TAKE 1/2 TO 1 (ONE-HALF TO ONE) TABLET BY MOUTH ONCE DAILY AS NEEDED FOR ERECTILE DYSFUNCTION, Disp: 5 tablet, Rfl: 0   spironolactone  (ALDACTONE ) 25 MG tablet, Take 1 tablet (25 mg total) by mouth daily., Disp: 90 tablet, Rfl: 3   Omega-3 Fatty Acids (FISH OIL) 1000 MG CAPS, Take 1,000 mg by mouth at bedtime. (Patient not taking: Reported on 10/15/2024), Disp: , Rfl:    tiZANidine  (ZANAFLEX ) 4 MG tablet, Take 1 tablet (4 mg total) by mouth 3 (three) times daily. (Patient not taking: Reported on 10/15/2024), Disp: 90 tablet, Rfl: 1   Varenicline  Tartrate, Starter, (CHANTIX  STARTING MONTH PAK) 0.5 MG X 11 & 1 MG X 42 TBPK, Take as instructed on packaging (Patient not taking: Reported on 10/15/2024),  Disp: 1 each, Rfl: 0 No Known Allergies   Social History   Socioeconomic History   Marital status: Legally Separated    Spouse name: Not on file   Number of children: Not on file   Years of education: Not on file   Highest education level: Not on file  Occupational History   Not on file  Tobacco Use   Smoking status: Never    Passive exposure: Never   Smokeless tobacco: Never  Vaping Use   Vaping status: Never Used  Substance and Sexual Activity   Alcohol use: Not Currently    Alcohol/week: 2.0 standard drinks of alcohol    Types: 2 Cans of beer per week   Drug use: No   Sexual activity: Yes    Birth control/protection: None  Other Topics Concern   Not on file  Social History Narrative   Not on file   Social Drivers of Health   Financial Resource Strain: Medium Risk (05/24/2023)   Overall Financial Resource Strain (CARDIA)    Difficulty of Paying Living Expenses: Somewhat hard  Food Insecurity: No Food Insecurity (02/04/2024)   Hunger Vital Sign    Worried About Running Out of Food in the Last Year: Never true    Ran Out of Food in the Last Year: Never true  Transportation Needs: No Transportation Needs (05/24/2023)   PRAPARE - Administrator, Civil Service (Medical): No    Lack of Transportation (Non-Medical): No  Physical Activity: Sufficiently Active (05/24/2023)   Exercise Vital Sign    Days of Exercise per Week: 7 days    Minutes of Exercise per Session: 30 min  Stress: No Stress Concern Present (05/24/2023)   [employer] of Occupational Health - Occupational Stress Questionnaire    Feeling of Stress : Not at all  Social Connections: Moderately Isolated (05/24/2023)   Social Connection and Isolation Panel    Frequency of Communication with Friends and Family: More than three times a week    Frequency of Social Gatherings with Friends and Family: More than three times a week    Attends Religious Services: Never    Database Administrator or  Organizations: No    Attends [employer]  or Organization Meetings: Never    Marital Status: Living with partner  Intimate Partner Violence: Not At Risk (05/24/2023)   Humiliation, Afraid, Rape, and Kick questionnaire    Fear of Current or Ex-Partner: No    Emotionally Abused: No    Physically Abused: No    Sexually Abused: No    Physical Exam      Future Appointments  Date Time Provider Department Center  11/04/2024  2:00 PM [facility] PHARMACIST [facility]  12/05/2024  7:00 AM [facility] DEVICE REMOTES [facility]  03/06/2025  7:00 AM [facility] DEVICE REMOTES [facility]  03/18/2025  2:00 PM Oley Bascom RAMAN, NP [facility]  06/05/2025  7:00 AM [facility] DEVICE REMOTES [facility]

## 2024-10-15 NOTE — Telephone Encounter (Signed)
 [facility]- he is needing his bubble packs refilled one week sooner as his granddaughter deconstructed his and they had to throw several pills away and they plan to refill them and deliver tomorrow but they advised they need a refill for his Gabapentin .   I will send to PCP team for same.   [name], EMT-Paramedic [phone] 10/15/2024

## 2024-10-15 NOTE — Telephone Encounter (Signed)
 [name] is inquiring about [employer] device referral to be sent to [facility]. I contacted them and they advised they have no referral from Dr. Robie office. I will forward for further advice.   [name], EMT-Paramedic [phone] 10/15/2024

## 2024-10-16 NOTE — Telephone Encounter (Signed)
 Message sent to paramedicine to assist advising Pt of disconnected monitor.

## 2024-10-16 NOTE — Telephone Encounter (Signed)
 Signed and in M. Maccia's RPH box.

## 2024-10-16 NOTE — Addendum Note (Signed)
 Addended by: JANIT GENI CROME on: 10/16/2024 04:19 PM   Modules accepted: Orders

## 2024-10-16 NOTE — Telephone Encounter (Signed)
 Call to [facility] at Dr. Milagros office to check on status of referral for [facility] device, left message. Re-entered referral.

## 2024-10-17 NOTE — Telephone Encounter (Signed)
 Form faxed as requested.

## 2024-10-21 NOTE — Telephone Encounter (Signed)
   Hi can you send again with the date? If the form is still in the office

## 2024-10-22 NOTE — Telephone Encounter (Signed)
 Called [facility]. Confirmed they could not read the date on page 2 -not 8 like it says. Faxed again with date written out again

## 2024-10-22 NOTE — Telephone Encounter (Signed)
 I faxed the form from the mailbox as it was. I am not sure what date, if any, was on the form. The paperwork was returned to [facility] mailbox with the fax confirmation attached to it.

## 2024-10-22 NOTE — Telephone Encounter (Signed)
 Hi, do you still have this? They are stating they need the prescriber information sent with the date

## 2024-10-27 ENCOUNTER — Other Ambulatory Visit: Payer: Self-pay | Admitting: Nurse Practitioner

## 2024-10-27 DIAGNOSIS — E1165 Type 2 diabetes mellitus with hyperglycemia: Secondary | ICD-10-CM

## 2024-10-27 DIAGNOSIS — G6289 Other specified polyneuropathies: Secondary | ICD-10-CM

## 2024-10-28 NOTE — Telephone Encounter (Signed)
 Please advise [facility]

## 2024-11-04 ENCOUNTER — Other Ambulatory Visit (HOSPITAL_COMMUNITY): Payer: Self-pay

## 2024-11-04 ENCOUNTER — Telehealth (HOSPITAL_COMMUNITY): Payer: Self-pay

## 2024-11-04 ENCOUNTER — Ambulatory Visit: Payer: Self-pay

## 2024-11-04 VITALS — BP 108/56 | HR 67 | Wt 282.4 lb

## 2024-11-04 DIAGNOSIS — Z716 Tobacco abuse counseling: Secondary | ICD-10-CM

## 2024-11-04 DIAGNOSIS — N529 Male erectile dysfunction, unspecified: Secondary | ICD-10-CM

## 2024-11-04 MED ORDER — VARENICLINE TARTRATE (STARTER) 0.5 MG X 11 & 1 MG X 42 PO TBPK: ORAL_TABLET | ORAL | 0 refills | Status: DC

## 2024-11-04 NOTE — Telephone Encounter (Signed)
 [facility] confirmed med had shipped. [name] confirmed ozempic  is at [facility].

## 2024-11-04 NOTE — Patient Instructions (Addendum)
 It was nice to see you today!   Medication Changes: Continue Humalog  75/25 - take 30 units twice daily before meals. I will call the pharmacy to check on your next delivery.  Continue Ozempic  2 mg weekly - I will check on the cost through your insurance in January.   Restart varenicline  (Chantix ) as prescribed in starter pack for smoking cessation.   Call [facility] for free nicotine  patches to help quit smoking. 1-800-QUIT-NOW (410) 378-8983)  Continue all other medications as prescribed in your pill packs. Let us  know if you have dizziness or lightheadedness that you feel is related to low blood pressure.  If you experience symptoms of a low blood sugar (dizzy, shaky, sweaty) check your blood sugar. If it is less than 70 mg/dL, you should eat or drink 15 grams of fast-acting carbohydrates like 4 glucose tablets, 1/2 cup of regular soda, or 1/2 cup of juice. Recheck your blood sugar after 15 minutes. If the level is still below 70 mg/dL repeat the process. If this occurs frequently, you should notify your healthcare provider.   Lifestyle Recommendations:  Aim for 150 minutes of moderate intensity exercise every week. This is any activity that elevates your heart rate and breathing rate, but still allows you to carry on a conversation. Exercising after meals can help prevent spikes in your blood sugar.  Diet Recommendations:   Try to eat 3 real meals using the healthy plate method and 1-2 snacks per day. Never go more than 4-5 hours while awake without eating. Eat breakfast within the first hour of getting up.     Carbohydrates include starch, sugar, and fiber. Sugar and starch raise blood glucose.  Starchy foods include bread, rice, pasta, potatoes, corn, cereal, grits, crackers, bagels, muffins, all baked foods Some fruits are higher in sugar, like grapes, watermelon, oranges, and most tropical fruits. Limit your serving size to 1/2 cup at a time.  Protein foods include meat, fish,  poultry, eggs, dairy, and beans (although beans also provide carbohydrates).  Non-starchy vegetables do not impact your blood sugar very much. These include greens, broccoli, asparagus, carrots, cauliflower, cucumber, mushrooms, peppers, yellow squash, zucchini squash, tomato, to name a few!  Avoid all sugary beverages. Stay hydrated with water throughout the day. Sparkling/flavored water, unsweet tea, black coffee, and zero-sugar or diet drinks will not impact your blood sugar, but they should not be used as your only source of hydration!  You can find more information at https://diabetes.org/food-nutrition/eating-healthy   [name], PharmD [facility] [phone]

## 2024-11-04 NOTE — Telephone Encounter (Signed)
 Spoke to Mr. Fredericksen who reports he is doing well and that he was seen by pharmacy team today.   He is using bubble packs and says they are working well.    No needs at present will follow up with a home visit in a week for full paramedicine visit.   [name], EMT-Paramedic [phone] 11/04/2024

## 2024-11-04 NOTE — Progress Notes (Unsigned)
 11/04/2024 Name: [name] MRN: [id number] DOB: 02-01-1961  Chief Complaint  Patient presents with   Diabetes   Congestive Heart Failure   Nicotine  Dependence    [name] is a 63 y.o. year old male who presented for a face-to-face visit.   They were referred to the pharmacist by their PCP for assistance in managing diabetes. PMH includes HFpEF (EF 45-50% in Feb 2025) with ICD in place, Afib s/p Watchman, PVCs, GI bleed, T2DM, CKD3, schizophrenia, anxiety, OCS,     Subjective: Patient was last seen by PCP, Bascom Borer, NP, on 06/16/24. At last visit, BP was close to goal at 130/84 mmHg, HR 74 bpm. A1C increased from 6.9% to 8.1%. Patient endorsed difficulty adhering to healthy food choices. He reported that he was motivated to quit smoking and was initiated on varenicline . At pharmacy call on 07/22/24, patient reported doing well. Was wearing FL3 sensor and reported he had made a lot of diet changes. Encouraged adherence. At PCP f/u on 09/17/24, A1C had improved to 6.7%. CMP was stable.  Today, patient reports doing well. He presents in good sprits without assistance. He does not have his Libre reader device with him. Continues to have issues with phone service and connectivity. Reports [name] medical laboratory scientific officer) has been helping him to get refills of medications from [facility]. Reports he has 1.5 pens of Ozempic  2 mg left and 2 pens of Humalog  left. He requests refill of sildenafil  and varenicline . Reviewed income today ($2300/mo) which is too high for Medicare Extra Help. Per test claim, appears patient is currently in Medicare catastrophic coverage and copays are $0.   Care Team: Primary Care Provider: Borer Bascom RAMAN, NP ; Next Scheduled Visit: 03/18/25   Medication Access/Adherence  Current Pharmacy:  [facility] - [place], [place] - [address] [place] [address] Phone: [phone] Fax: [phone]   Patient reports  affordability concerns with their medications: Yes  - Medicare copays are expensive. Patient currently in catastrophic coverage. Patient reports access/transportation concerns to their pharmacy: No  Patient reports adherence concerns with their medications:  No  - has paramedicine come to house for med organization and review [name]). [facility] is packing medications in TID adherence packaging.  Took medications this AM.  Diabetes:  Current medications: Jardiance  10 mg daily,  metformin  XR 500 mg BID with meals, Ozempic  2 mg once weekly (Mondays, has 1.5 boxes left, ~6 weeks), Humalog  Mix 75/25 30 units BID with  (needs refill from [facility])  Current glucose readings:  Using Freestyle Libre 3 meter - using reader; does not have reader with him today so we are unable to review averages. Denies BG <70 mg/dL.   Patient denies hypoglycemic s/sx including dizziness, shakiness, sweating. Patient denies hyperglycemic symptoms including polyuria, polydipsia, polyphagia, nocturia, neuropathy, blurred vision.  Current meal patterns: did not discuss in depth today  Current physical activity: did not discuss today, states he has goal to increase exercise  Current medication access support:  - NovoCares PAP for Ozempic  (expires Dec 2025, then program being discontinued) - LillyCares for Humalog  Mix 75/25 (previously enrolled through Dec 2024)  Heart Failure (EF 45-50% in Feb 2025):  Current medications:  ACEi/ARB/ARNI: Entresto  24-26 mg BID SGLT2i: Jardiance  10 mg daily Beta blocker: metoprolol  succinate 75 mg (1.5x 50 mg tabs) daily Mineralocorticoid Receptor Antagonist: spironolactone  25 mg daily Diuretic regimen: furosemide  40 mg BID + potassium 20 mEq BID  Patient is managed by paramedicine Deniece)  Current medication access support:  - Approved for a [employer] grant that will help cover the cost of Entresto , Jardiance , Metoprolol , Spironolactone . Total amount  awarded, $4,500. Effective: 03/20/2024 - 03/19/2025.  Tobacco Abuse:  Tobacco Use History: Number of cigarettes per day - 8-10 cigarettes per day Smokes first cigarette within 10-15 minutes after waking Does not wake at night to smoke Triggers include - stress  Quit Attempt History: Most recent quit attempt - 11-12 years ago he was able to quit, and only restarted 4 months ago  Longest time ever been tobacco free - 12 years Methods tried in the past include Varenicline  (Chantix ) - nausea, but only took for a couple days Rates IMPORTANCE of quitting tobacco on 1-10 scale of 10. Rates READINESS of quitting tobacco on 1-10 scale of 9. Rates CONFIDENCE of quitting tobacco on 1-10 scale of 8.  Objective:  BP Readings from Last 3 Encounters:  11/04/24 (!) 108/56  10/15/24 (!) 140/68  09/24/24 (!) 140/68    Lab Results  Component Value Date   HGBA1C 6.7 (A) 09/17/2024   HGBA1C 8.1 (A) 06/16/2024   HGBA1C 6.9 (A) 02/04/2024       Latest Ref Rng & Units 09/17/2024    2:45 PM 02/04/2024   10:37 AM 01/01/2024    2:24 PM  BMP  Glucose 70 - 99 mg/dL 897  95  70   BUN 8 - 27 mg/dL 14  10  8    Creatinine 0.76 - 1.27 mg/dL 8.96  9.07  9.09   BUN/Creat Ratio 10 - 24 14  11     Sodium 134 - 144 mmol/L 138  143  140   Potassium 3.5 - 5.2 mmol/L 4.6  3.6  3.5   Chloride 96 - 106 mmol/L 103  103  106   CO2 20 - 29 mmol/L 22  25  25    Calcium  8.6 - 10.2 mg/dL 9.1  9.1  8.9     Lab Results  Component Value Date   CHOL 118 02/04/2024   HDL 45 02/04/2024   LDLCALC 55 02/04/2024   TRIG 93 02/04/2024   CHOLHDL 2.6 02/04/2024    Medications Reviewed Today     Reviewed by Brinda Lorain SQUIBB, RPH-CPP (Pharmacist) on 11/04/24 at 1535  Med List Status: <None>   Medication Order Taking? Sig Documenting Provider Last Dose Status Informant  acetaminophen  (TYLENOL ) 500 MG tablet [id number]  Take 2 tablets (1,000 mg total) by mouth 2 (two) times daily as needed. Rosario Leatrice FERNS, MD  Active Self,  Pharmacy Records           Med Note [facility], [name] L   Mon Jun 23, 2024 11:51 AM)    albuterol  (VENTOLIN  HFA) 108 [phone] Base) MCG/ACT inhaler [id number]  Inhale 2 puffs into the lungs every 6 (six) hours as needed for wheezing or shortness of breath. Myrna Camelia HERO, NP  Expired 10/15/24 2359 Self, Pharmacy Records           Med Note [facility], [name] L   Tue Mar 06, 2023  4:53 PM)    aspirin  EC 81 MG tablet [id number]  Take 1 tablet (81 mg total) by mouth daily. Swallow whole. Oley Bascom RAMAN, NP  Active   atorvastatin  (LIPITOR) 40 MG tablet [id number] Yes Take 1 tablet (40 mg total) by mouth daily. Oley Bascom RAMAN, NP  Active   blood glucose meter kit and supplies KIT [id number]  Dispense based on patient and insurance preference. Use up to four times daily  as directed. (FOR ICD-9 250.00, 250.01). Raenelle Coria, MD  Active Self, Pharmacy Records  busPIRone  (BUSPAR ) 10 MG tablet [id number] Yes Take 1 tablet (10 mg total) by mouth 2 (two) times daily. Oley Bascom RAMAN, NP  Active   colchicine  0.6 MG tablet [id number]  Daily for 7 days then daily PRN for gout flare Oley Bascom RAMAN, NP  Active   Continuous Glucose Receiver (FREESTYLE LIBRE 3 READER) DEVI [id number] Yes 1 each by Does not apply route daily. Use to check glucose continuously Nichols, Tonya S, NP  Active   Continuous Glucose Sensor (FREESTYLE LIBRE 3 PLUS SENSOR) MISC [id number] Yes Change sensor every 15 days. Oley Bascom RAMAN, NP  Active   diclofenac  Sodium (VOLTAREN ) 1 % GEL [id number]  Apply 2 g topically daily as needed. Oley Bascom RAMAN, NP  Active   Diclofenac  Sodium 3 % GEL [id number]  Apply 1 Application topically daily as needed. Oley Bascom RAMAN, NP  Active   empagliflozin  (JARDIANCE ) 10 MG TABS tablet [id number] Yes Take 1 tablet (10 mg total) by mouth daily. Oley Bascom RAMAN, NP  Active   etodolac  (LODINE ) 400 MG tablet [id number]  Take 1 tablet (400 mg total) by mouth 2 (two) times daily. Oley Bascom RAMAN, NP  Active   ferrous  sulfate 325 (65 FE) MG tablet [id number]  Take 1 tablet (325 mg total) by mouth every other day. Oley Bascom RAMAN, NP  Active   furosemide  (LASIX ) 20 MG tablet [id number] Yes Take 3 tablets (60 mg total) by mouth 2 (two) times daily. Glena Raisin [place], [place]  Active   gabapentin  (NEURONTIN ) 800 MG tablet [id number] Yes TAKE 1 TABLET BY MOUTH 3 TIMES DAILY Nichols, Tonya S, NP  Active   Insulin  Lispro Prot & Lispro (HUMALOG  MIX 75/25 KWIKPEN) (75-25) 100 UNIT/ML Kary [id number] Yes Inject 30 Units into the skin 2 (two) times daily. Oley Bascom RAMAN, NP  Active   metFORMIN  (GLUCOPHAGE -XR) 500 MG 24 hr tablet [id number] Yes Take 1 tablet (500 mg total) by mouth 2 (two) times daily with a meal. Oley Bascom RAMAN, NP  Active   metoprolol  succinate (TOPROL -XL) 50 MG 24 hr tablet [id number] Yes Take 1.5 tablets (75 mg total) by mouth daily. Oley Bascom RAMAN, NP  Active   Multiple Vitamins-Minerals (CENTRUM SILVER 50+MEN) TABS [id number]  Take 1 tablet by mouth daily. [provider]  Active Self, Pharmacy Records  Omega-3 Fatty Acids (FISH OIL) 1000 MG CAPS [id number]  Take 1,000 mg by mouth at bedtime.  Patient not taking: Reported on 10/15/2024   [provider]  Active Self  pantoprazole  (PROTONIX ) 40 MG tablet [id number] Yes Take 1 tablet (40 mg total) by mouth daily. Oley Bascom RAMAN, NP  Active   potassium chloride  SA (KLOR-CON  M) 20 MEQ tablet [id number] Yes Take 2 tablets (40 mEq total) by mouth daily. Oley Bascom RAMAN, NP  Active   QUEtiapine  (SEROQUEL ) 200 MG tablet [id number] Yes Take 1 tablet (200 mg total) by mouth at bedtime. Oley Bascom RAMAN, NP  Active   RELION PEN NEEDLES 31G X 6 MM MISC [id number]  INJECT 1 PEN INTO THE SKIN 2 TIMES DAILY Nichols, Tonya S, NP  Active   sacubitril -valsartan  (ENTRESTO ) 24-26 MG [id number] Yes Take 1 tablet by mouth 2 (two) times daily. Oley Bascom RAMAN, NP  Active   Semaglutide , 2 MG/DOSE, (OZEMPIC , 2 MG/DOSE,) 8 MG/3ML SOPN [id number] Yes Inject 2 mg  into the skin once a week. Monday Oley Bascom RAMAN, NP  Active  sildenafil  (VIAGRA ) 100 MG tablet [id number]  TAKE 1/2 TO 1 (ONE-HALF TO ONE) TABLET BY MOUTH ONCE DAILY AS NEEDED FOR ERECTILE DYSFUNCTION Oley Bascom RAMAN, NP  Active   spironolactone  (ALDACTONE ) 25 MG tablet [id number] Yes Take 1 tablet (25 mg total) by mouth daily. Oley Bascom RAMAN, NP  Active   tiZANidine  (ZANAFLEX ) 4 MG tablet [id number]  Take 1 tablet (4 mg total) by mouth 3 (three) times daily.  Patient not taking: Reported on 10/15/2024   Oley Bascom RAMAN, NP  Active   Varenicline  Tartrate, Starter, (CHANTIX  STARTING MONTH PAK) 0.5 MG X 11 & 1 MG X 42 TBPK [id number]  Take as instructed on packaging  Patient not taking: Reported on 11/04/2024   Oley Bascom RAMAN, NP  Active   Med List Note Maria Camelia HERO, [place] 04/01/21 1345):                Assessment/Plan:   Diabetes: - Currently controlled with most recent A1C of 6.7% below goal <7% with improved adherence and improvement in diet. Patient is on maximum dose Ozempic  and is tolerating well. Will need to troubleshoot access as NovoCares program is ending in Jan 2026. Will try to minimize cost of other medications, so patient may be able to afford GLP-1 copay. While patient is in catastrophic coverage, will attempt to obtain 3 mo supply. Unable to adjust insulin  dose today without SMBG data. There is room for increasing his doses of metformin  and/or Jardiance  if additional pharmacotherapy is needed in the future.  - Last UACR 49 mg/g 02/04/24 - Patient denies personal or family history of multiple endocrine neoplasia type 2, medullary thyroid  cancer; personal history of pancreatitis or gallbladder disease. - Reviewed long term cardiovascular and renal outcomes of uncontrolled blood sugar - Reviewed goal A1c, goal fasting, and goal 2 hour post prandial glucose - Reviewed hypoglycemia management plan and the rule of 15 - Reviewed dietary modifications including  utilizing the  healthy plate method, limiting portion size of carbohydrate foods, increasing intake of protein and non-starchy vegetables. Counseled patient to stay hydrated with water throughout the day. - Recommend to continue Ozempic  2 mg weekly, Jardiance  10 mg daily, Humalog  75/25 Mix 30 units BID with meals. Requested that [facility] schedule delivery of Humalog  75/25 for tomorrow. - Provided patient with refills for maintenance medications and CGM. Educated to bring his reader device to all his appointments.  - Will collaborate with CPhT to resubmit PA for Ozempic  to investigate copay given changes to NovoCares in 2026. Will also investigate whether patient can be enrolled in NovoCares to receive Novolog  mix 70/30, anticipating issues with cost of Humalog  75/25 starting in 2026. - Next A1C due 12/18/24    Heart Failure: - Currently appropriately managed - Reviewed appropriate blood pressure monitoring technique and reviewed goal blood pressure - Reviewed to weigh daily and when to contact cardiology with weight gain - Recommend to continue current regimen  Current medications:  ACEi/ARB/ARNI: Entresto  24-26 mg BID SGLT2i: Jardiance  10 mg daily Beta blocker: metoprolol  succinate 75 mg (1.5x 50 mg tabs) daily Mineralocorticoid Receptor Antagonist: spironolactone  25 mg daily Diuretic regimen: furosemide  40 mg BID + potassium 20 mEq BID   Tobacco Abuse - Currently uncontrolled - Provided motivational interviewing to assess tobacco use and strategies for reduction - Provided information on 1 800 QUIT NOW support program - Patient agreeable to restart varenicline  0.5 mg daily for 3 days, then 0.5 mg twice daily for 4 days, then 1 mg twice daily thereafter.  Counseled to take with food to reduce risk of GI adverse effects. Also counseled to stop therapy and contact provider if change in mood.  - Provided information for [place] [facility] to obtain free supply of nicotine  patches and  gum/lozenges   Patient verbalized understanding of treatment plan. Provided written instructions   Follow Up Plan:  Pharmacist in person 12/17/24 PCP clinic visit 03/18/25   Lorain Baseman, PharmD [facility] [phone]

## 2024-11-05 ENCOUNTER — Other Ambulatory Visit: Payer: Self-pay | Admitting: Nurse Practitioner

## 2024-11-05 DIAGNOSIS — G6289 Other specified polyneuropathies: Secondary | ICD-10-CM

## 2024-11-05 DIAGNOSIS — E1165 Type 2 diabetes mellitus with hyperglycemia: Secondary | ICD-10-CM

## 2024-11-05 MED ORDER — SILDENAFIL CITRATE 100 MG PO TABS: 100.0000 mg | ORAL_TABLET | ORAL | 1 refills | Status: AC | PRN

## 2024-11-05 NOTE — Telephone Encounter (Signed)
 Please advise [facility]

## 2024-11-05 NOTE — Telephone Encounter (Signed)
 Attempted to contact Pt.  Message received that call could not be completed as dialed.  Will follow up.

## 2024-11-06 ENCOUNTER — Other Ambulatory Visit: Payer: Self-pay

## 2024-11-06 NOTE — Telephone Encounter (Signed)
 Lmom for patient

## 2024-11-07 ENCOUNTER — Telehealth: Payer: Self-pay

## 2024-11-07 ENCOUNTER — Other Ambulatory Visit: Payer: Self-pay

## 2024-11-07 NOTE — Telephone Encounter (Signed)
 Pharmacy Patient Advocate Encounter   Received notification from Pt Calls Messages that prior authorization for OZEMPIC  is required/requested.   Insurance verification completed.   The patient is insured through [facility].   Per test claim: PA required; PA submitted to above mentioned insurance via CoverMyMeds Key/confirmation #/EOC [id number] Status is pending

## 2024-11-10 NOTE — Telephone Encounter (Signed)
 PA RESPONSE:   Hello, We received this request for authorization of coverage for a medication. However this member received this medication through a Patient Assistance Program; thus, no review is necessary. If you have any additional questions, please contact us  at [phone]. Thank you, [employer] [facility] [employer]  _____________________________________________________________________________________________  Therefore, will not be able to obtain patient supply of Ozempic  prior to end of the year. Will plan to rebill in Jan 2026. Patient does have 2 boxes of Ozmepic 2 mg that [facility] can pick [facility] from [facility]. Patient was notified by CPhT Corean Maiden.   Lorain Baseman, PharmD [facility] [phone]

## 2024-11-10 NOTE — Telephone Encounter (Signed)
 Spoke with the patient and made him aware the ozempic  is here waiting on him

## 2024-11-14 ENCOUNTER — Other Ambulatory Visit: Payer: Self-pay | Admitting: Nurse Practitioner

## 2024-11-14 DIAGNOSIS — E1165 Type 2 diabetes mellitus with hyperglycemia: Secondary | ICD-10-CM

## 2024-11-14 DIAGNOSIS — G6289 Other specified polyneuropathies: Secondary | ICD-10-CM

## 2024-11-17 ENCOUNTER — Telehealth (HOSPITAL_COMMUNITY): Payer: Self-pay

## 2024-11-17 NOTE — Telephone Encounter (Signed)
 Spoke to Mr. Coppola to attempt to schedule paramedicine home visit- he was unavailable today. We planned a home visit for next Monday. Call complete.   [name], EMT-Paramedic [phone] 11/17/2024

## 2024-11-24 ENCOUNTER — Telehealth: Payer: Self-pay

## 2024-11-24 ENCOUNTER — Encounter: Payer: Self-pay | Admitting: Emergency Medicine

## 2024-11-24 NOTE — Telephone Encounter (Signed)
 certified letter sent to patient after 3 failed attempts at trying to reach patient

## 2024-11-26 ENCOUNTER — Telehealth (HOSPITAL_COMMUNITY): Payer: Self-pay

## 2024-11-26 NOTE — Telephone Encounter (Signed)
 Several attempts made to meet Mr. Dunlap and phone calls made and he did not answer. Messages left and I drove past his hours several times and he is not home. I will attempt to follow up next week.     [name], EMT-Paramedic [phone] 11/26/2024

## 2024-12-03 ENCOUNTER — Other Ambulatory Visit: Payer: Self-pay | Admitting: Nurse Practitioner

## 2024-12-03 ENCOUNTER — Telehealth (HOSPITAL_COMMUNITY): Payer: Self-pay

## 2024-12-03 ENCOUNTER — Telehealth: Payer: Self-pay | Admitting: *Deleted

## 2024-12-03 ENCOUNTER — Other Ambulatory Visit (HOSPITAL_COMMUNITY): Payer: Self-pay | Admitting: Family Medicine

## 2024-12-03 DIAGNOSIS — G6289 Other specified polyneuropathies: Secondary | ICD-10-CM

## 2024-12-03 DIAGNOSIS — E1165 Type 2 diabetes mellitus with hyperglycemia: Secondary | ICD-10-CM

## 2024-12-03 NOTE — Telephone Encounter (Signed)
 Please Advise.  CB.

## 2024-12-03 NOTE — Telephone Encounter (Signed)
 Called in attempt to schedule Mr. Goines for paramedicine without success. VM left. He has been unreachable for several weeks now. I will continue to follow up.   [name], EMT-Paramedic [phone] 12/03/2024

## 2024-12-03 NOTE — Telephone Encounter (Signed)
 potassium chloride  SA (KLOR-CON  M) 20 MEQ tablet [Pharmacy Med Name: potassium chloride  ER 20 mEq tablet,extended release(part/cryst)]

## 2024-12-03 NOTE — Telephone Encounter (Signed)
 gabapentin  (NEURONTIN ) 800 MG tablet [Pharmacy Med Name: gabapentin  800 mg tablet]

## 2024-12-03 NOTE — Telephone Encounter (Signed)
 Call to set up PV and colonoscopy that is due. Unable to reach. LM with call back #

## 2024-12-05 ENCOUNTER — Ambulatory Visit

## 2024-12-05 DIAGNOSIS — I480 Paroxysmal atrial fibrillation: Secondary | ICD-10-CM | POA: Diagnosis not present

## 2024-12-07 ENCOUNTER — Ambulatory Visit: Payer: Self-pay | Admitting: Cardiology

## 2024-12-07 LAB — CUP PACEART REMOTE DEVICE CHECK
Date Time Interrogation Session: 20260109091706
Implantable Lead Connection Status: 753985
Implantable Lead Implant Date: 20211008
Implantable Lead Location: 753860
Implantable Lead Model: 436910
Implantable Lead Serial Number: 81404997
Implantable Pulse Generator Implant Date: 20211008
Pulse Gen Model: 429525
Pulse Gen Serial Number: 84810752

## 2024-12-08 NOTE — Progress Notes (Signed)
 Remote ICD Transmission

## 2024-12-10 ENCOUNTER — Other Ambulatory Visit: Payer: Self-pay | Admitting: Nurse Practitioner

## 2024-12-10 ENCOUNTER — Telehealth (HOSPITAL_COMMUNITY): Payer: Self-pay

## 2024-12-10 DIAGNOSIS — F419 Anxiety disorder, unspecified: Secondary | ICD-10-CM

## 2024-12-10 MED ORDER — BUSPIRONE HCL 10 MG PO TABS: 10.0000 mg | ORAL_TABLET | Freq: Two times a day (BID) | ORAL | 0 refills | Status: AC

## 2024-12-10 NOTE — Telephone Encounter (Signed)
 Pharmacy needing RX for Buspirone  10mg  for Mr. Blasko.   Please send to [facility].   [name], EMT-Paramedic [phone] 12/10/2024

## 2024-12-11 ENCOUNTER — Telehealth (HOSPITAL_COMMUNITY): Payer: Self-pay

## 2024-12-11 NOTE — Telephone Encounter (Signed)
 Unable to reach Coleman after multiple attempts this week.   Phone appears cut off.   I tried to call his wife Mercy, sister Cindia with no success.   Plan to d/c with approval of HF clinic as our last visit was in November.   [name], EMT-Paramedic [phone] 12/11/2024

## 2024-12-12 NOTE — Progress Notes (Signed)
 MAMADOU BREON                                          MRN: [id number]   12/12/2024   The VBCI Quality Team Specialist reviewed this patient medical record for the purposes of chart review for care gap closure. The following were reviewed: abstraction for care gap closure-controlling blood pressure.    VBCI Quality Team

## 2024-12-14 ENCOUNTER — Encounter (HOSPITAL_COMMUNITY): Payer: Self-pay | Admitting: Adult Health

## 2024-12-14 NOTE — Progress Notes (Unsigned)
" °  HF [facility] Discharge  Discussed with HF Paramedic and HF Team.   Per HF Paramedic- He is on bubble packs, has not had any admission. Difficult to reach   Hadar Elgersma NP_C 7:59 PM  "

## 2024-12-15 ENCOUNTER — Telehealth (HOSPITAL_COMMUNITY): Payer: Self-pay

## 2024-12-15 NOTE — Telephone Encounter (Signed)
 [facility] is needing [name] renewed in order to refill his bubble packs. I will send over to pharmacy team at HF clinic.    [name], EMT-Paramedic [phone] 12/15/2024

## 2024-12-15 NOTE — Telephone Encounter (Signed)
 Received a call from [place] today where he stated he has been falling off lately and admitted to drug use and wanting to get back on track and asked for assistance with seeking potential detox/rehab. I offered him locations of these services with addresses and numbers.   He asked about our next paramedicine visit and I made him aware of discharge due to lack of him following up with me and he asked how to get back on track- I advised him he would need to re-engage with clinic and be re-referred. He agreed and said he would make appointment. I will follow up once referral is placed.   He also said pharmacy is waiting on renewal of grant to send his meds out- I will send a message out to pharmacy team.   [name], EMT-Paramedic [phone] 12/15/2024

## 2024-12-16 ENCOUNTER — Ambulatory Visit (HOSPITAL_COMMUNITY)
Admission: RE | Admit: 2024-12-16 | Discharge: 2024-12-16 | Disposition: A | Discharge status: Home / Self Care | Source: Ambulatory Visit | Attending: Cardiology | Admitting: Cardiology

## 2024-12-16 ENCOUNTER — Other Ambulatory Visit (HOSPITAL_COMMUNITY): Payer: Self-pay

## 2024-12-16 ENCOUNTER — Ambulatory Visit (HOSPITAL_COMMUNITY): Payer: Self-pay | Admitting: Cardiology

## 2024-12-16 ENCOUNTER — Encounter (HOSPITAL_COMMUNITY): Payer: Self-pay | Admitting: Cardiology

## 2024-12-16 VITALS — BP 130/80 | HR 93 | Wt 269.6 lb

## 2024-12-16 DIAGNOSIS — F17200 Nicotine dependence, unspecified, uncomplicated: Secondary | ICD-10-CM | POA: Diagnosis not present

## 2024-12-16 DIAGNOSIS — Z794 Long term (current) use of insulin: Secondary | ICD-10-CM | POA: Insufficient documentation

## 2024-12-16 DIAGNOSIS — E669 Obesity, unspecified: Secondary | ICD-10-CM | POA: Diagnosis not present

## 2024-12-16 DIAGNOSIS — I498 Other specified cardiac arrhythmias: Secondary | ICD-10-CM | POA: Insufficient documentation

## 2024-12-16 DIAGNOSIS — Z7984 Long term (current) use of oral hypoglycemic drugs: Secondary | ICD-10-CM | POA: Insufficient documentation

## 2024-12-16 DIAGNOSIS — Z7985 Long-term (current) use of injectable non-insulin antidiabetic drugs: Secondary | ICD-10-CM | POA: Diagnosis not present

## 2024-12-16 DIAGNOSIS — G4733 Obstructive sleep apnea (adult) (pediatric): Secondary | ICD-10-CM | POA: Insufficient documentation

## 2024-12-16 DIAGNOSIS — I493 Ventricular premature depolarization: Secondary | ICD-10-CM | POA: Insufficient documentation

## 2024-12-16 DIAGNOSIS — Z955 Presence of coronary angioplasty implant and graft: Secondary | ICD-10-CM | POA: Insufficient documentation

## 2024-12-16 DIAGNOSIS — Z95818 Presence of other cardiac implants and grafts: Secondary | ICD-10-CM | POA: Diagnosis not present

## 2024-12-16 DIAGNOSIS — I472 Ventricular tachycardia, unspecified: Secondary | ICD-10-CM | POA: Insufficient documentation

## 2024-12-16 DIAGNOSIS — I5022 Chronic systolic (congestive) heart failure: Secondary | ICD-10-CM | POA: Insufficient documentation

## 2024-12-16 DIAGNOSIS — Z555 Less than a high school diploma: Secondary | ICD-10-CM | POA: Insufficient documentation

## 2024-12-16 DIAGNOSIS — E119 Type 2 diabetes mellitus without complications: Secondary | ICD-10-CM | POA: Insufficient documentation

## 2024-12-16 DIAGNOSIS — F209 Schizophrenia, unspecified: Secondary | ICD-10-CM | POA: Diagnosis not present

## 2024-12-16 DIAGNOSIS — R0683 Snoring: Secondary | ICD-10-CM | POA: Insufficient documentation

## 2024-12-16 DIAGNOSIS — I480 Paroxysmal atrial fibrillation: Secondary | ICD-10-CM | POA: Insufficient documentation

## 2024-12-16 DIAGNOSIS — Z635 Disruption of family by separation and divorce: Secondary | ICD-10-CM | POA: Insufficient documentation

## 2024-12-16 DIAGNOSIS — Z79899 Other long term (current) drug therapy: Secondary | ICD-10-CM | POA: Insufficient documentation

## 2024-12-16 DIAGNOSIS — Z7982 Long term (current) use of aspirin: Secondary | ICD-10-CM | POA: Insufficient documentation

## 2024-12-16 DIAGNOSIS — Z6834 Body mass index (BMI) 34.0-34.9, adult: Secondary | ICD-10-CM | POA: Insufficient documentation

## 2024-12-16 DIAGNOSIS — K31811 Angiodysplasia of stomach and duodenum with bleeding: Secondary | ICD-10-CM | POA: Insufficient documentation

## 2024-12-16 DIAGNOSIS — I110 Hypertensive heart disease with heart failure: Secondary | ICD-10-CM | POA: Insufficient documentation

## 2024-12-16 DIAGNOSIS — I2510 Atherosclerotic heart disease of native coronary artery without angina pectoris: Secondary | ICD-10-CM | POA: Diagnosis not present

## 2024-12-16 DIAGNOSIS — Z59868 Other specified financial insecurity: Secondary | ICD-10-CM | POA: Diagnosis not present

## 2024-12-16 DIAGNOSIS — Z95810 Presence of automatic (implantable) cardiac defibrillator: Secondary | ICD-10-CM | POA: Insufficient documentation

## 2024-12-16 LAB — PRO BRAIN NATRIURETIC PEPTIDE: Pro Brain Natriuretic Peptide: 202 pg/mL

## 2024-12-16 LAB — BASIC METABOLIC PANEL WITH GFR
Anion gap: 12 (ref 5–15)
BUN: 20 mg/dL (ref 8–23)
CO2: 28 mmol/L (ref 22–32)
Calcium: 9.4 mg/dL (ref 8.9–10.3)
Chloride: 99 mmol/L (ref 98–111)
Creatinine, Ser: 1.39 mg/dL — ABNORMAL HIGH (ref 0.61–1.24)
GFR, Estimated: 57 mL/min — ABNORMAL LOW
Glucose, Bld: 183 mg/dL — ABNORMAL HIGH (ref 70–99)
Potassium: 4.8 mmol/L (ref 3.5–5.1)
Sodium: 138 mmol/L (ref 135–145)

## 2024-12-16 LAB — LIPID PANEL
Cholesterol: 115 mg/dL (ref 0–200)
HDL: 33 mg/dL — ABNORMAL LOW
LDL Cholesterol: 43 mg/dL (ref 0–99)
Total CHOL/HDL Ratio: 3.5 ratio
Triglycerides: 194 mg/dL — ABNORMAL HIGH
VLDL: 39 mg/dL (ref 0–40)

## 2024-12-16 MED ORDER — SACUBITRIL-VALSARTAN 49-51 MG PO TABS: 1.0000 | ORAL_TABLET | Freq: Two times a day (BID) | ORAL | 3 refills | Status: AC

## 2024-12-16 NOTE — Telephone Encounter (Signed)
 Hey! His current grant is effective until April. I did call and confirm the information with [facility] and I believe they are filling his medications now. Let me know if you need anything else!

## 2024-12-16 NOTE — Patient Instructions (Signed)
 CHANGE Entresto  to 49/51 mg Twice daily  Labs done today, your results will be available in MyChart, we will contact you for abnormal readings.  REPEAT BLOOD WORK IN 10 DAYS.  Your physician has requested that you have an echocardiogram. Echocardiography is a painless test that uses sound waves to create images of your heart. It provides your doctor with information about the size and shape of your heart and how well your hearts chambers and valves are working. This procedure takes approximately one hour. There are no restrictions for this procedure. Please do NOT wear cologne, perfume, aftershave, or lotions (deodorant is allowed). Please arrive 15 minutes prior to your appointment time.  Please note: We ask at that you not bring children with you during ultrasound (echo/ vascular) testing. Due to room size and safety concerns, children are not allowed in the ultrasound rooms during exams. Our front office staff cannot provide observation of children in our lobby area while testing is being conducted. An adult accompanying a patient to their appointment will only be allowed in the ultrasound room at the discretion of the ultrasound technician under special circumstances. We apologize for any inconvenience.  You have been referred to Dr. Roark grout the [facility] Device. His office will call you to arrange your appointment.  You have been referred back to the Electrophysiologist. They will call you to arrange your appointment.  Your physician recommends that you schedule a follow-up appointment in: 4 months.  If you have any questions or concerns before your next appointment please send us  a message through [facility] or call our office at [phone].    TO LEAVE A MESSAGE FOR THE NURSE SELECT OPTION 2, PLEASE LEAVE A MESSAGE INCLUDING: YOUR NAME DATE OF BIRTH CALL BACK NUMBER REASON FOR CALL**this is important as we prioritize the call backs  YOU WILL RECEIVE A CALL BACK THE SAME DAY AS LONG  AS YOU CALL BEFORE 4:00 PM  At the [facility], you and your health needs are our priority. As part of our continuing mission to provide you with exceptional heart care, we have created designated Provider Care Teams. These Care Teams include your primary Cardiologist (physician) and Advanced Practice Providers (APPs- Physician Assistants and Nurse Practitioners) who all work together to provide you with the care you need, when you need it.   You may see any of the following providers on your designated Care Team at your next follow up: Dr Toribio Fuel Dr Ezra Shuck Dr. Morene Brownie Greig Mosses, NP Caffie Shed, [place] [facility] [place], [place] Beckey Coe, NP Jordan Lee, NP Ellouise Class, NP Tinnie Redman, PharmD Jaun Bash, PharmD   Please be sure to bring in all your medications bottles to every appointment.    Thank you for choosing [facility]

## 2024-12-16 NOTE — Progress Notes (Signed)
 Paramedicine Encounter   Patient ID: [name] , male,   DOB: [birth year],63 y.o.,  MRN: [id number]   Pt here for f/u-he has been MIA for past couple months.  He says he was out of town for a bit, and phone being cut off and but back on.   [facility]- waiting on referral to dr byers-being sent today.   NSR today.  Fluid looks good.   Echo needs to be redone.  Labs today and again in 10 days.   [facility] does [facility] pill box.   Met patient in clinic today with provider.  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 Does not have medicaid?? Sent message to jenna and stephanie to see if they can find out more info.    Izetta Quivers, EMT-Paramedic [phone] 12/16/2024

## 2024-12-16 NOTE — Addendum Note (Signed)
 Encounter addended by: [name] Ezra RAMAN, MD on: 12/16/2024 11:09 PM  Actions taken: Clinical Note Signed

## 2024-12-16 NOTE — Progress Notes (Addendum)
 "  ADVANCED HEART FAILURE CLINIC NOTE  PCP: Oley Bascom RAMAN, NP Cardiology: Dr. Shlomo HF Cardiology: Dr. [name]  Chief complaint: CHF  64 y.o. with history of chronic systolic CHF, CAD, type 2 diabetes, paroxysmal atrial fibrillation, and schizophrenia was referred by Dr. Shlomo for evaluation of CHF.  Patient had OM2 PCI in 2012.  In 11/20, he was admitted with CHF. Echo showed EF 25-30% with diffuse hypokinesis.  LHC was done, showing occluded OM2 at prior stent, 90% D1 stenosis, and extensive diffuse RCA disease.  No intervention.  He was thought to be in paroxysmal atrial fibrillation during this appointment and apixaban  was started.   He does not smoke, rarely drinks, and does not use drugs.  His mother had heart problems.    He can write his name only. He is only able to read a few words. Thinks he completed the 7th grade.    Echo in 5/21 showed EF 30% with diffuse hypokinesis, mild LVH, PASP 38, mildly decreased RV systolic function, IVC dilated. Biotronik ICD placed.   He was hospitalized in 7/21 with upper GI bleeding and CHF exacerbation.  EGD showed duodenal AVMs, treated with APC. He was diuresed.   Clinic visit 5/22 he was volume overloaded, weight up 19 lbs, and diuretics increased and Farxiga  started.   He was admitted to [facility] a couple weeks later (04/13/21) for a/c CHF. He was diuresed with IV lasix . Hospitalization complicated by GIB & AKI. Enteroscopy performed on 5/20 which showed 2 small angioectasias with no bleeding in the third portion of the duodenum. Colonoscopy on 5/21, showed multiple polyps which were biopsied. No source of bleeding found. He had pill endoscopy 5/22 which showed non bleeding AVM. Pt had repeat enteroscopy which showed non bleeding jejunal ulcer. Eliquis  held during admission but restarted at discharge. Home hydralazine , spiro, Entresto , and Imdur  stopped in setting of GIB; carvedilol  dose decreased at discharge.  He returned 6/22 for post  hospitalization HF follow up. Up 10 lbs per paramedicine. NYHA II symptoms. Entresto  restarted and lasix  increased.  Admitted 7/22 for chest pain, found to have hgb of 4.7. He received 2 U PRBCs, Eliquis  was held and GI & AHF were consulted for further management. Farxiga  and spiro held with increase in kidney function. Repeat echo on 06/30/21 EF 50-55%, mildly dilated LV, mild LVH, RV okay, dilated IVC with estimated RA pressure of 8. He underwent EGD on 07/01/21 showing three non-bleeding angioectasias in the duodenum. Treated with argon plasma coagulation (APC). GDMT therapy added back as SCr improved. Eliquis  restarted and arrangement made for referral for Watchman device consideration. Day of discharge weight 324 lbs, SCr 1.29 and hgb 8.3.   S/p Watchman 10/27/21.   Device showed ATP (07/27/22) >>inappropriate shock for rapid AF/AFL, did convert to NSR. His Coreg  was changed to Toprol  at EP follow up.    Echo in 2/25 showed EF 45-50%, normal RV, normal IVC.   Today he returns for HF follow up. He has been lost to paramedicine for a few months. He has been going to the [facility] for exercise.  No dyspnea except with heavy exertion.  He is still smoking but is using Chantix  to try to quit. No chest pain.  No lightheadedness or palpitations.  Snores loudly, cannot tolerate CPAP. Weight down 9 lbs.   ECG (personally reviewed): NSR, poor RWP  Labs (12/22): K 4.7, creatinine 1.13 Labs (10/23): K 4.4, creatinine 1.12 Labs (1/24): K 3.8, creatinine 1.23, LFTs normal Labs (9/24): K 5.4,  creatinine 1.19 Labs (10/25): K 4.6, creatinine 1.03, hgb 14.6  PMH:  1. Atrial fibrillation: Paroxysmal - s/p LAAO device (12/22) 2. Type 2 diabetes 3. HTN 4. Hyperlipidemia 5. Schizophrenia 6. CAD: PCI OM2 in 2012.  - LHC (11/20): 90% D1 stenosis, totally occluded OM2 at stent, serial 85%/70%/60% RCA stenoses.  7. 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  Biotronik ICD.  - Echo  (11/20): EF 25-30%, global hypokinesis.  - Echo (5/21): EF 30% with diffuse hypokinesis, mild LVH, PASP 38, mildly decreased RV systolic function, IVC dilated. - TEE (1/23): EF 50-55%, global LV HK, normal RV - Echo (2/25): EF 45-50%, normal RV, normal IVC.  8. Upper GI bleeding: 7/21, duodenal AVMs treated with APC.  9. OSA: Does not use CPAP regularly.   Social History   Socioeconomic History   Marital status: Legally Separated    Spouse name: Not on file   Number of children: Not on file   Years of education: Not on file   Highest education level: Not on file  Occupational History   Not on file  Tobacco Use   Smoking status: Never    Passive exposure: Never   Smokeless tobacco: Never  Vaping Use   Vaping status: Never Used  Substance and Sexual Activity   Alcohol use: Not Currently    Alcohol/week: 2.0 standard drinks of alcohol    Types: 2 Cans of beer per week   Drug use: No   Sexual activity: Yes    Birth control/protection: None  Other Topics Concern   Not on file  Social History Narrative   Not on file   Social Drivers of Health   Tobacco Use: Low Risk (12/16/2024)   Patient History    Smoking Tobacco Use: Never    Smokeless Tobacco Use: Never    Passive Exposure: Never  Financial Resource Strain: Medium Risk (05/24/2023)   Overall Financial Resource Strain (CARDIA)    Difficulty of Paying Living Expenses: Somewhat hard  Food Insecurity: No Food Insecurity (02/04/2024)   Hunger Vital Sign    Worried About Running Out of Food in the Last Year: Never true    Ran Out of Food in the Last Year: Never true  Transportation Needs: No Transportation Needs (05/24/2023)   PRAPARE - Administrator, Civil Service (Medical): No    Lack of Transportation (Non-Medical): No  Physical Activity: Sufficiently Active (05/24/2023)   Exercise Vital Sign    Days of Exercise per Week: 7 days    Minutes of Exercise per Session: 30 min  Stress: No Stress Concern Present  (05/24/2023)   [employer] of Occupational Health - Occupational Stress Questionnaire    Feeling of Stress : Not at all  Social Connections: Moderately Isolated (05/24/2023)   Social Connection and Isolation Panel    Frequency of Communication with Friends and Family: More than three times a week    Frequency of Social Gatherings with Friends and Family: More than three times a week    Attends Religious Services: Never    Database Administrator or Organizations: No    Attends Banker Meetings: Never    Marital Status: Living with partner  Intimate Partner Violence: Not At Risk (05/24/2023)   Humiliation, Afraid, Rape, and Kick questionnaire    Fear of Current or Ex-Partner: No    Emotionally Abused: No    Physically Abused: No    Sexually Abused: No  Depression (PHQ2-9): High Risk (09/17/2024)   Depression (  PHQ2-9)    PHQ-2 Score: 13  Alcohol Screen: Low Risk (05/24/2023)   Alcohol Screen    Last Alcohol Screening Score (AUDIT): 0  Housing: Low Risk (05/24/2023)   Housing    Last Housing Risk Score: 0  Utilities: Not At Risk (05/24/2023)   AHC Utilities    Threatened with loss of utilities: No  Health Literacy: Not on file   Family History  Problem Relation Age of Onset   Heart failure Mother    Mental illness Sister    Mental illness Sister    ROS: All systems reviewed and negative except as per HPI.  Current Meds  Medication Sig   acetaminophen  (TYLENOL ) 500 MG tablet Take 2 tablets (1,000 mg total) by mouth 2 (two) times daily as needed.   albuterol  (VENTOLIN  HFA) 108 (90 Base) MCG/ACT inhaler Inhale 2 puffs into the lungs every 6 (six) hours as needed for wheezing or shortness of breath.   aspirin  EC 81 MG tablet Take 1 tablet (81 mg total) by mouth daily. Swallow whole.   atorvastatin  (LIPITOR) 40 MG tablet Take 1 tablet (40 mg total) by mouth daily.   blood glucose meter kit and supplies KIT Dispense based on patient and insurance preference. Use up to  four times daily as directed. (FOR ICD-9 250.00, 250.01).   busPIRone  (BUSPAR ) 10 MG tablet Take 1 tablet (10 mg total) by mouth 2 (two) times daily.   colchicine  0.6 MG tablet Daily for 7 days then daily PRN for gout flare   Continuous Glucose Receiver (FREESTYLE LIBRE 3 READER) DEVI 1 each by Does not apply route daily. Use to check glucose continuously   Continuous Glucose Sensor (FREESTYLE LIBRE 3 PLUS SENSOR) MISC Change sensor every 15 days.   diclofenac  Sodium (VOLTAREN ) 1 % GEL Apply 2 g topically daily as needed.   Diclofenac  Sodium 3 % GEL Apply 1 Application topically daily as needed.   empagliflozin  (JARDIANCE ) 10 MG TABS tablet Take 1 tablet (10 mg total) by mouth daily.   etodolac  (LODINE ) 400 MG tablet Take 1 tablet (400 mg total) by mouth 2 (two) times daily.   ferrous sulfate  325 (65 FE) MG tablet Take 1 tablet (325 mg total) by mouth every other day.   furosemide  (LASIX ) 20 MG tablet TAKE 3 TABLETS BY MOUTH 2 TIMES DAILY   gabapentin  (NEURONTIN ) 800 MG tablet TAKE 1 TABLET BY MOUTH 3 TIMES DAILY   Insulin  Lispro Prot & Lispro (HUMALOG  MIX 75/25 KWIKPEN) (75-25) 100 UNIT/ML Kwikpen Inject 30 Units into the skin 2 (two) times daily.   metFORMIN  (GLUCOPHAGE -XR) 500 MG 24 hr tablet Take 1 tablet (500 mg total) by mouth 2 (two) times daily with a meal.   metoprolol  succinate (TOPROL -XL) 50 MG 24 hr tablet Take 1.5 tablets (75 mg total) by mouth daily.   Multiple Vitamins-Minerals (CENTRUM SILVER 50+MEN) TABS Take 1 tablet by mouth daily.   Omega-3 Fatty Acids (FISH OIL) 1000 MG CAPS Take 1,000 mg by mouth at bedtime.   pantoprazole  (PROTONIX ) 40 MG tablet Take 1 tablet (40 mg total) by mouth daily.   potassium chloride  SA (KLOR-CON  M) 20 MEQ tablet TAKE 2 TABLETS BY MOUTH EVERY DAY   QUEtiapine  (SEROQUEL ) 200 MG tablet Take 1 tablet (200 mg total) by mouth at bedtime.   RELION PEN NEEDLES 31G X 6 MM MISC INJECT 1 PEN INTO THE SKIN 2 TIMES DAILY   sacubitril -valsartan  (ENTRESTO )  49-51 MG Take 1 tablet by mouth 2 (two) times daily.  Semaglutide , 2 MG/DOSE, (OZEMPIC , 2 MG/DOSE,) 8 MG/3ML SOPN Inject 2 mg into the skin once a week. Monday   sildenafil  (VIAGRA ) 100 MG tablet Take 1 tablet (100 mg total) by mouth as needed for erectile dysfunction.   spironolactone  (ALDACTONE ) 25 MG tablet Take 1 tablet (25 mg total) by mouth daily.   tiZANidine  (ZANAFLEX ) 4 MG tablet Take 1 tablet (4 mg total) by mouth 3 (three) times daily.   Varenicline  Tartrate, Starter, (CHANTIX  STARTING MONTH PAK) 0.5 MG X 11 & 1 MG X 42 TBPK Take as instructed on packaging   [DISCONTINUED] sacubitril -valsartan  (ENTRESTO ) 24-26 MG Take 1 tablet by mouth 2 (two) times daily.   Current Outpatient Medications on File Prior to Encounter  Medication Sig Dispense Refill   acetaminophen  (TYLENOL ) 500 MG tablet Take 2 tablets (1,000 mg total) by mouth 2 (two) times daily as needed. 180 tablet 3   albuterol  (VENTOLIN  HFA) 108 (90 Base) MCG/ACT inhaler Inhale 2 puffs into the lungs every 6 (six) hours as needed for wheezing or shortness of breath. 18 g 1   aspirin  EC 81 MG tablet Take 1 tablet (81 mg total) by mouth daily. Swallow whole. 30 tablet 2   atorvastatin  (LIPITOR) 40 MG tablet Take 1 tablet (40 mg total) by mouth daily. 90 tablet 1   blood glucose meter kit and supplies KIT Dispense based on patient and insurance preference. Use up to four times daily as directed. (FOR ICD-9 250.00, 250.01). 1 each 0   busPIRone  (BUSPAR ) 10 MG tablet Take 1 tablet (10 mg total) by mouth 2 (two) times daily. 180 tablet 0   colchicine  0.6 MG tablet Daily for 7 days then daily PRN for gout flare 30 tablet 0   Continuous Glucose Receiver (FREESTYLE LIBRE 3 READER) DEVI 1 each by Does not apply route daily. Use to check glucose continuously 1 each 1   Continuous Glucose Sensor (FREESTYLE LIBRE 3 PLUS SENSOR) MISC Change sensor every 15 days. 6 each 3   diclofenac  Sodium (VOLTAREN ) 1 % GEL Apply 2 g topically daily as needed.  150 g 2   Diclofenac  Sodium 3 % GEL Apply 1 Application topically daily as needed. 100 g 2   empagliflozin  (JARDIANCE ) 10 MG TABS tablet Take 1 tablet (10 mg total) by mouth daily. 90 tablet 3   etodolac  (LODINE ) 400 MG tablet Take 1 tablet (400 mg total) by mouth 2 (two) times daily. 60 tablet 3   ferrous sulfate  325 (65 FE) MG tablet Take 1 tablet (325 mg total) by mouth every other day. 30 tablet 1   furosemide  (LASIX ) 20 MG tablet TAKE 3 TABLETS BY MOUTH 2 TIMES DAILY 180 tablet 3   gabapentin  (NEURONTIN ) 800 MG tablet TAKE 1 TABLET BY MOUTH 3 TIMES DAILY 90 tablet 0   Insulin  Lispro Prot & Lispro (HUMALOG  MIX 75/25 KWIKPEN) (75-25) 100 UNIT/ML Kwikpen Inject 30 Units into the skin 2 (two) times daily. 60 mL 3   metFORMIN  (GLUCOPHAGE -XR) 500 MG 24 hr tablet Take 1 tablet (500 mg total) by mouth 2 (two) times daily with a meal. 180 tablet 3   metoprolol  succinate (TOPROL -XL) 50 MG 24 hr tablet Take 1.5 tablets (75 mg total) by mouth daily. 45 tablet 11   Multiple Vitamins-Minerals (CENTRUM SILVER 50+MEN) TABS Take 1 tablet by mouth daily.     Omega-3 Fatty Acids (FISH OIL) 1000 MG CAPS Take 1,000 mg by mouth at bedtime.     pantoprazole  (PROTONIX ) 40 MG tablet Take 1 tablet (  40 mg total) by mouth daily. 90 tablet 0   potassium chloride  SA (KLOR-CON  M) 20 MEQ tablet TAKE 2 TABLETS BY MOUTH EVERY DAY 60 tablet 2   QUEtiapine  (SEROQUEL ) 200 MG tablet Take 1 tablet (200 mg total) by mouth at bedtime. 90 tablet 2   RELION PEN NEEDLES 31G X 6 MM MISC INJECT 1 PEN INTO THE SKIN 2 TIMES DAILY 100 each 0   Semaglutide , 2 MG/DOSE, (OZEMPIC , 2 MG/DOSE,) 8 MG/3ML SOPN Inject 2 mg into the skin once a week. Monday 3 mL 2   sildenafil  (VIAGRA ) 100 MG tablet Take 1 tablet (100 mg total) by mouth as needed for erectile dysfunction. 5 tablet 1   spironolactone  (ALDACTONE ) 25 MG tablet Take 1 tablet (25 mg total) by mouth daily. 90 tablet 3   tiZANidine  (ZANAFLEX ) 4 MG tablet Take 1 tablet (4 mg total) by mouth  3 (three) times daily. 90 tablet 1   Varenicline  Tartrate, Starter, (CHANTIX  STARTING MONTH PAK) 0.5 MG X 11 & 1 MG X 42 TBPK Take as instructed on packaging 53 each 0   No current facility-administered medications on file prior to encounter.   BP 130/80   Pulse 93   Wt 122.3 kg (269 lb 9.6 oz)   SpO2 98%   BMI 34.61 kg/m   Wt Readings from Last 3 Encounters:  12/16/24 122.3 kg (269 lb 9.6 oz)  11/04/24 128.1 kg (282 lb 6.4 oz)  10/15/24 125 kg (275 lb 9.6 oz)  General: NAD Neck: No JVD, no thyromegaly or thyroid  nodule.  Lungs: Clear to auscultation bilaterally with normal respiratory effort. CV: Nondisplaced PMI.  Heart regular S1/S2, no S3/S4, no murmur.  No peripheral edema.  No carotid bruit.  Normal pedal pulses.  Abdomen: Soft, nontender, no hepatosplenomegaly, no distention.  Skin: Intact without lesions or rashes.  Neurologic: Alert and oriented x 3.  Psych: Normal affect. Extremities: No clubbing or cyanosis.  HEENT: Normal.   Assessment/Plan: 1. Chronic systolic CHF: Echo in 11/20 with EF 25-30%, echo 5/21 with EF 30% with mildly decreased RV systolic function. Biotronik ICD.  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 Echo 8/22 with improvement in LVEF to 50-55%. No longer drinking ETOH. TEE (1/23) EF 50-55%. Echo in 2/25 showed EF 45-50%.  NYHA class I-II, not volume overloaded on exam.  - Continue Lasix  60 mg bid. BMET/BNP today. - Continue Toprol  XL 75 mg daily.  - Increase Entresto  to 49/51 bid, BMET in 10 days.   - Continue spironolactone  25 mg daily.  - Continue Jardiance  10 mg daily.  - Repeat echo.  2. 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 No further bleeding issues now that he is off anticoagulation s/p Watchman.  - Continue PPI.  3. Paroxysmal atrial fibrillation: CHADS2-VASc score = 4 (CAD, CHF, HTN, DM).  Off Eliquis  with frequent GIB.  Now s/p Watchman 12/22. NSR today.  - Continue ASA 81 with  Watchman.  4. CAD: Last cath 09/2021 with occluded OM2 stent, 90% small OM1, diffuse disease RCA treated medically.  No chest pain.  - Continue ASA.  - Continue atorvastatin  40 mg daily, check lipids today.  5. HTN: BP controlled.  6. OSA: Cannot tolerate CPAP.  - Refer to ENT for [facility] device. . 7. PVCs: Zio 11/24 showed 4% PVC burden, short NSVT run. - Continue Toprol  - Needs sleep apnea treatment.  8. Obesity: Body mass index is 34.61 kg/m.  Weight trending down.  -  He is now on semaglutide . 9. Smoking: Active smoker, using Chantix .  10. He is going to re-enter the paramedicine program.   Follow up in 4 months APP  I spent 32 minutes reviewing records, interviewing/examining patient, and managing orders.   Ezra Shuck  12/16/24 "

## 2024-12-17 ENCOUNTER — Ambulatory Visit: Payer: Self-pay

## 2024-12-17 ENCOUNTER — Telehealth (HOSPITAL_COMMUNITY): Payer: Self-pay

## 2024-12-17 ENCOUNTER — Telehealth: Payer: Self-pay | Admitting: Pharmacist

## 2024-12-17 ENCOUNTER — Other Ambulatory Visit (HOSPITAL_COMMUNITY): Payer: Self-pay

## 2024-12-17 ENCOUNTER — Telehealth (HOSPITAL_COMMUNITY): Payer: Self-pay | Admitting: Licensed Clinical Social Worker

## 2024-12-17 DIAGNOSIS — F17200 Nicotine dependence, unspecified, uncomplicated: Secondary | ICD-10-CM

## 2024-12-17 DIAGNOSIS — Z794 Long term (current) use of insulin: Secondary | ICD-10-CM

## 2024-12-17 DIAGNOSIS — E1165 Type 2 diabetes mellitus with hyperglycemia: Secondary | ICD-10-CM | POA: Diagnosis not present

## 2024-12-17 LAB — POCT GLYCOSYLATED HEMOGLOBIN (HGB A1C): HbA1c, POC (controlled diabetic range): 7.8 % — AB (ref 0.0–7.0)

## 2024-12-17 MED ORDER — VARENICLINE TARTRATE 1 MG PO TABS: 1.0000 mg | ORAL_TABLET | Freq: Two times a day (BID) | ORAL | 3 refills | Status: AC

## 2024-12-17 NOTE — Telephone Encounter (Signed)
 Mr. Bulman asked today for an appointment with a [facility] psychiatrist- I advised him he would need a referral for same. I will send to PCP for further.    [name], EMT-Paramedic [phone] 12/17/2024

## 2024-12-17 NOTE — Progress Notes (Signed)
 Paramedicine Encounter    Patient ID: [name], male    DOB: 09-01-1961, 64 y.o.   MRN: [id number]   Met with Mr Wessler where I picked up and delivered his medications at [facility]. They we're $53-we were able to get all meds in bubble packs minus the seroquel .   He was able to get his ozempic  from  Brinda Lorain SQUIBB, RPH-CPP today at [facility].   I plan to see [name] in one week.   Visit complete.    [name], EMT-Paramedic [phone] 12/17/2024   Patient Care Team: Oley Bascom RAMAN, NP as PCP - General (Pulmonary Disease) Shlomo Wilbert SAUNDERS, MD as PCP - Cardiology (Cardiology) [name] Ezra RAMAN, MD as PCP - Advanced Heart Failure (Cardiology) Cindie Ole DASEN, MD (Inactive) as PCP - Electrophysiology (Cardiology) Cathern Andriette DEL, LCSW as Social Worker (Licensed Clinical Social Worker) Eda Iha, MD (Inactive) as Consulting Physician (Gastroenterology) Brinda Lorain SQUIBB, RPH-CPP (Pharmacist)  Patient Active Problem List   Diagnosis Date Noted   PVC's (premature ventricular contractions) 09/28/2023   Acute right-sided low back pain with right-sided sciatica 05/14/2023   Multiple joint pain 12/22/2022   Obesity (BMI 35.0-39.9 without comorbidity) 07/18/2022   Cellulitis 06/22/2022   Cellulitis, leg 06/20/2022   Presence of Watchman left atrial appendage closure device 10/28/2021   Atrial fibrillation (HCC) 10/27/2021   Melena    Chronic diastolic CHF (congestive heart failure) (HCC)    GI bleed 06/29/2021   Severe anemia 06/29/2021   Adenomatous polyp of ascending colon    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   Acute blood loss anemia    Angiodysplasia of stomach    Chronic anticoagulation    CHF exacerbation (HCC) 06/15/2020   AKI (acute kidney injury) 06/15/2020   CKD (chronic kidney disease), stage III (HCC) 06/15/2020   Anemia due to chronic blood loss    Gastric AVM    Angiodysplasia of duodenum with hemorrhage     Chronic systolic CHF (congestive heart failure) (HCC) 04/05/2020   Anemia 04/05/2020   PAF (paroxysmal atrial fibrillation) (HCC) 04/05/2020   OSA (obstructive sleep apnea) 04/05/2020   Syncope and collapse 04/05/2020   Type 2 diabetes mellitus with hyperglycemia, with long-term current use of insulin  (HCC) 12/03/2019   Diabetic polyneuropathy associated with type 2 diabetes mellitus (HCC) 12/03/2019   Erectile dysfunction 12/03/2019   Paranoid schizophrenia, chronic condition (HCC) 01/26/2015   Severe recurrent major depressive disorder with psychotic features (HCC) 01/26/2015   GAD (generalized anxiety disorder) 01/26/2015   OCD (obsessive compulsive disorder) 01/26/2015   Panic disorder without agoraphobia 01/26/2015   Insomnia 01/26/2015   Current Medications[1] Allergies[2]   Social History   Socioeconomic History   Marital status: Legally Separated    Spouse name: Not on file   Number of children: Not on file   Years of education: Not on file   Highest education level: Not on file  Occupational History   Not on file  Tobacco Use   Smoking status: Never    Passive exposure: Never   Smokeless tobacco: Never  Vaping Use   Vaping status: Never Used  Substance and Sexual Activity   Alcohol use: Not Currently    Alcohol/week: 2.0 standard drinks of alcohol    Types: 2 Cans of beer per week   Drug use: No   Sexual activity: Yes    Birth control/protection: None  Other Topics Concern   Not on file  Social History Narrative   Not on file  Social Drivers of Health   Tobacco Use: Low Risk (12/16/2024)   Patient History    Smoking Tobacco Use: Never    Smokeless Tobacco Use: Never    Passive Exposure: Never  Financial Resource Strain: Medium Risk (05/24/2023)   Overall Financial Resource Strain (CARDIA)    Difficulty of Paying Living Expenses: Somewhat hard  Food Insecurity: No Food Insecurity (02/04/2024)   Hunger Vital Sign    Worried About Running Out of Food  in the Last Year: Never true    Ran Out of Food in the Last Year: Never true  Transportation Needs: No Transportation Needs (05/24/2023)   PRAPARE - Administrator, Civil Service (Medical): No    Lack of Transportation (Non-Medical): No  Physical Activity: Sufficiently Active (05/24/2023)   Exercise Vital Sign    Days of Exercise per Week: 7 days    Minutes of Exercise per Session: 30 min  Stress: No Stress Concern Present (05/24/2023)   [employer] of Occupational Health - Occupational Stress Questionnaire    Feeling of Stress : Not at all  Social Connections: Moderately Isolated (05/24/2023)   Social Connection and Isolation Panel    Frequency of Communication with Friends and Family: More than three times a week    Frequency of Social Gatherings with Friends and Family: More than three times a week    Attends Religious Services: Never    Database Administrator or Organizations: No    Attends Banker Meetings: Never    Marital Status: Living with partner  Intimate Partner Violence: Not At Risk (05/24/2023)   Humiliation, Afraid, Rape, and Kick questionnaire    Fear of Current or Ex-Partner: No    Emotionally Abused: No    Physically Abused: No    Sexually Abused: No  Depression (PHQ2-9): High Risk (09/17/2024)   Depression (PHQ2-9)    PHQ-2 Score: 13  Alcohol Screen: Low Risk (05/24/2023)   Alcohol Screen    Last Alcohol Screening Score (AUDIT): 0  Housing: Low Risk (05/24/2023)   Housing    Last Housing Risk Score: 0  Utilities: Not At Risk (05/24/2023)   AHC Utilities    Threatened with loss of utilities: No  Health Literacy: Not on file    Physical Exam      Future Appointments  Date Time Provider Department Center  12/26/2024 11:00 AM [facility] LAB [facility] None  01/28/2025 11:30 AM [facility] PHARMACIST [facility]  03/06/2025  7:00 AM CVD HVT DEVICE REMOTES [facility] H&V  03/18/2025  2:00 PM Oley Bascom RAMAN, NP [facility]  04/15/2025 11:00  AM [facility] ECHO OP 1 [facility]  04/15/2025 12:00 PM [facility] PA/NP [facility] None  06/05/2025  7:00 AM CVD HVT DEVICE REMOTES [facility] H&V     ACTION: Home visit completed          [1]  Current Outpatient Medications:    acetaminophen  (TYLENOL ) 500 MG tablet, Take 2 tablets (1,000 mg total) by mouth 2 (two) times daily as needed., Disp: 180 tablet, Rfl: 3   albuterol  (VENTOLIN  HFA) 108 (90 Base) MCG/ACT inhaler, Inhale 2 puffs into the lungs every 6 (six) hours as needed for wheezing or shortness of breath., Disp: 18 g, Rfl: 1   aspirin  EC 81 MG tablet, Take 1 tablet (81 mg total) by mouth daily. Swallow whole., Disp: 30 tablet, Rfl: 2   atorvastatin  (LIPITOR) 40 MG tablet, Take 1 tablet (40 mg total) by mouth daily., Disp: 90 tablet, Rfl: 1  blood glucose meter kit and supplies KIT, Dispense based on patient and insurance preference. Use up to four times daily as directed. (FOR ICD-9 250.00, 250.01)., Disp: 1 each, Rfl: 0   busPIRone  (BUSPAR ) 10 MG tablet, Take 1 tablet (10 mg total) by mouth 2 (two) times daily., Disp: 180 tablet, Rfl: 0   colchicine  0.6 MG tablet, Daily for 7 days then daily PRN for gout flare, Disp: 30 tablet, Rfl: 0   Continuous Glucose Receiver (FREESTYLE LIBRE 3 READER) DEVI, 1 each by Does not apply route daily. Use to check glucose continuously, Disp: 1 each, Rfl: 1   Continuous Glucose Sensor (FREESTYLE LIBRE 3 PLUS SENSOR) MISC, Change sensor every 15 days., Disp: 6 each, Rfl: 3   diclofenac  Sodium (VOLTAREN ) 1 % GEL, Apply 2 g topically daily as needed., Disp: 150 g, Rfl: 2   Diclofenac  Sodium 3 % GEL, Apply 1 Application topically daily as needed., Disp: 100 g, Rfl: 2   empagliflozin  (JARDIANCE ) 10 MG TABS tablet, Take 1 tablet (10 mg total) by mouth daily., Disp: 90 tablet, Rfl: 3   etodolac  (LODINE ) 400 MG tablet, Take 1 tablet (400 mg total) by mouth 2 (two) times daily., Disp: 60 tablet, Rfl: 3   ferrous sulfate  325 (65 FE) MG tablet, Take 1 tablet (325  mg total) by mouth every other day., Disp: 30 tablet, Rfl: 1   furosemide  (LASIX ) 20 MG tablet, TAKE 3 TABLETS BY MOUTH 2 TIMES DAILY, Disp: 180 tablet, Rfl: 3   gabapentin  (NEURONTIN ) 800 MG tablet, TAKE 1 TABLET BY MOUTH 3 TIMES DAILY, Disp: 90 tablet, Rfl: 0   Insulin  Lispro Prot & Lispro (HUMALOG  MIX 75/25 KWIKPEN) (75-25) 100 UNIT/ML Kwikpen, Inject 30 Units into the skin 2 (two) times daily., Disp: 60 mL, Rfl: 3   metFORMIN  (GLUCOPHAGE -XR) 500 MG 24 hr tablet, Take 1 tablet (500 mg total) by mouth 2 (two) times daily with a meal., Disp: 180 tablet, Rfl: 3   metoprolol  succinate (TOPROL -XL) 50 MG 24 hr tablet, Take 1.5 tablets (75 mg total) by mouth daily., Disp: 45 tablet, Rfl: 11   Multiple Vitamins-Minerals (CENTRUM SILVER 50+MEN) TABS, Take 1 tablet by mouth daily., Disp: , Rfl:    Omega-3 Fatty Acids (FISH OIL) 1000 MG CAPS, Take 1,000 mg by mouth at bedtime., Disp: , Rfl:    pantoprazole  (PROTONIX ) 40 MG tablet, Take 1 tablet (40 mg total) by mouth daily., Disp: 90 tablet, Rfl: 0   potassium chloride  SA (KLOR-CON  M) 20 MEQ tablet, TAKE 2 TABLETS BY MOUTH EVERY DAY, Disp: 60 tablet, Rfl: 2   QUEtiapine  (SEROQUEL ) 200 MG tablet, Take 1 tablet (200 mg total) by mouth at bedtime., Disp: 90 tablet, Rfl: 2   RELION PEN NEEDLES 31G X 6 MM MISC, INJECT 1 PEN INTO THE SKIN 2 TIMES DAILY, Disp: 100 each, Rfl: 0   sacubitril -valsartan  (ENTRESTO ) 49-51 MG, Take 1 tablet by mouth 2 (two) times daily., Disp: 180 tablet, Rfl: 3   Semaglutide , 2 MG/DOSE, (OZEMPIC , 2 MG/DOSE,) 8 MG/3ML SOPN, Inject 2 mg into the skin once a week. Monday, Disp: 3 mL, Rfl: 2   sildenafil  (VIAGRA ) 100 MG tablet, Take 1 tablet (100 mg total) by mouth as needed for erectile dysfunction., Disp: 5 tablet, Rfl: 1   spironolactone  (ALDACTONE ) 25 MG tablet, Take 1 tablet (25 mg total) by mouth daily., Disp: 90 tablet, Rfl: 3   tiZANidine  (ZANAFLEX ) 4 MG tablet, Take 1 tablet (4 mg total) by mouth 3 (three) times daily., Disp: 90  tablet, Rfl: 1   Varenicline  Tartrate, Starter, (CHANTIX  STARTING MONTH PAK) 0.5 MG X 11 & 1 MG X 42 TBPK, Take as instructed on packaging, Disp: 53 each, Rfl: 0 [2] No Known Allergies

## 2024-12-17 NOTE — Telephone Encounter (Addendum)
 H&V Care Navigation CSW Progress Note  Clinical Social Worker and patient advocate consulted to assist with med cost concerns.  CSW called pt pharmacy to discuss- able to re-provide HF Lorrene information for jardiance  and entresto  but patient still has deductible that isn't being met so seroquel  will be $88.  CSW unable to assist with this cost currently.  Unfortunately no other options to assist at this time with seroquel  cost.  CSW able to assist with copays for other medications.  Patient has supply of seroquel  and gets paid beginning of the month so will plan to figure out purchasing then.  [name] Leech, LCSW Clinical Social Worker [facility] Desk#: [phone] Cell#: [phone]

## 2024-12-17 NOTE — Telephone Encounter (Signed)
 Dispensed Ozempic  patient assistance to patient, 6 month supply

## 2024-12-17 NOTE — Patient Instructions (Addendum)
 It was nice to see you today! Your A1C today was 7.8%. Our goal is to get back down to less than 7%.   Here is the number for Ear/Nose/Throat (ENT) doctor (Dr. Roark)- [phone]  Medication Changes:  Continue Ozempic  2 mg weekly until you run out. You can pick up 2 more boxes from [facility] at [employer] (5th floor)  Continue insulin  (Humalog  mix) 30 units twice daily with meals  Use nicotine  lozenges every 1-2 hours as needed to help with quitting smoking. - To use a nicotine  lozenge, place it between your cheek and gum, let it dissolve slowly (20-30 mins), rotating it occasionally, and do not chew, suck, or swallow it, as nicotine  absorbs through your mouth lining, not your stomach  Continue all other medication the same.    Diet Recommendations:     Avoid all sugary beverages. Stay hydrated with water throughout the day. Sparkling/flavored water, unsweet tea, black coffee, and zero-sugar or diet drinks will not impact your blood sugar, but they should not be used as your only source of hydration!   _______________________________________________________________  If you experience symptoms of a low blood sugar (dizzy, shaky, sweaty) check your blood sugar. If it is less than 70 mg/dL, you should eat or drink 15 grams of fast-acting carbohydrates like 4 glucose tablets, 1/2 cup of regular soda, or 1/2 cup of juice. Recheck your blood sugar after 15 minutes. If the level is still below 70 mg/dL repeat the process. If this occurs frequently, you should notify your healthcare provider.   Lorain Baseman, PharmD [facility] [phone]

## 2024-12-17 NOTE — Progress Notes (Signed)
 "  12/17/2024 Name: [name] MRN: [id number] DOB: 10/27/1961  Chief Complaint  Patient presents with   Diabetes    GARELD OBRECHT is a 64 y.o. year old male who presented for a face-to-face visit.   They were referred to the pharmacist by their PCP for assistance in managing diabetes. PMH includes HFpEF (EF 45-50% in Feb 2025) with ICD in place, Afib s/p Watchman, PVCs, GI bleed, T2DM, CKD3, schizophrenia, anxiety, OCS,     Subjective: Patient was last seen by PCP, Bascom Borer, on 09/17/24, A1C had improved to 6.7%. CMP was stable. At pharmacy appt on 11/04/24, patient reported having 1.5 pens of Ozempic  2 mg left and 2 pens of Humalog  left. Counseled on restarting varenicline  and requesting NRT to support smoking cessation. Saw AHF provided, Dr. [name], yesterday. Entresto  increased to 49-51 mg BID.   Today, patient presents in good spirits without assistance. States he is having some issues with Medicare prescription costs/deductible. AHF clinic and paramedicine provider [name] are assisting. States he did request patches and lozenges from [place] quitline and has been using patches daily. Down to 3 cigarettes per day. He was using lozenges incorrectly (chewing), and has not been using them recently. Forgot to bring [place] reader with him today.   Care Team: Primary Care Provider: Borer Bascom RAMAN, NP ; Next Scheduled Visit: 03/18/25   Medication Access/Adherence  Current Pharmacy:  [facility] - [place], [place] - [address] [place] [address] Phone: [phone] Fax: [phone]   Patient reports affordability concerns with their medications: Yes  - Medicare copays are expensive. Income too high for Medicare Extra Help. Patient reports access/transportation concerns to their pharmacy: No  Patient reports adherence concerns with their medications:  No  - has paramedicine come to house for med organization and review [name]). [facility] is packing medications in TID adherence packaging.  Diabetes:  Current medications: Jardiance  10 mg daily,  metformin  XR 500 mg BID with meals, Ozempic  2 mg once weekly (Mondays, says he has 1.5 pens left - same as he reported previously, Humalog  Mix 75/25 30 units BID with  (states he has multiple boxes)  Current glucose readings:  Using Freestyle Libre 3 meter - using reader; does not have reader with him today so we are unable to review averages. Denies BG <70 mg/dL.   Patient denies hypoglycemic s/sx including dizziness, shakiness, sweating. Patient denies hyperglycemic symptoms including polyuria, polydipsia, polyphagia, nocturia, neuropathy, blurred vision.  Current meal patterns: did not discuss in depth today, but patient reports he has been drinking more regular soda recently (Pepsi, Coke)  Current physical activity: did not discuss today  Current medication access support:  - NovoCares PAP for Ozempic  (expired Dec 2025, but patient picked up 6 boxes - 6 month supply - from [facility] after our visit today) - LillyCares for Humalog  Mix 75/25 (previously enrolled through Dec 2024) - obtained signature for re-enrollment today given cost concerns at pharmacy  Heart Failure (EF 45-50% in Feb 2025):  Current medications:  ACEi/ARB/ARNI: Entresto  49-51 mg BID SGLT2i: Jardiance  10 mg daily Beta blocker: metoprolol  succinate 75 mg (1.5x 50 mg tabs) daily Mineralocorticoid Receptor Antagonist: spironolactone  25 mg daily Diuretic regimen: furosemide  40 mg BID + potassium 20 mEq BID  Patient is managed by paramedicine Deniece)  Current medication access support:  - Approved for a [employer] grant that will help cover the cost of Entresto , Jardiance , Metoprolol , Spironolactone . Total amount awarded, $4,500. Effective: 03/20/2024 -  03/19/2025. Patient has exhausted funds on grant as of Jan 2026.   Tobacco Abuse:  Current medications: varenicline  1 mg BID,  Nicotine  patch (provided by [place] quitline). Has lozenges but has not been using recently.  Tobacco Use History: Number of cigarettes per day - baseline 8-10 cigarettes per day >>> down to 3 cigarettes per day Smokes first cigarette within 10-15 minutes after waking Does not wake at night to smoke Triggers include - stress  Quit Attempt History: Most recent quit attempt - 11-12 years ago he was able to quit, and only restarted 4 months ago  Longest time ever been tobacco free - 12 years Methods tried in the past include Varenicline  (Chantix ) - nausea, but only took for a couple days Rates IMPORTANCE of quitting tobacco on 1-10 scale of 10. Rates READINESS of quitting tobacco on 1-10 scale of 9. Rates CONFIDENCE of quitting tobacco on 1-10 scale of 8.  Objective:  BP Readings from Last 3 Encounters:  12/16/24 130/80  11/04/24 (!) 108/56  10/15/24 (!) 140/68    Lab Results  Component Value Date   HGBA1C 7.8 (A) 12/17/2024   HGBA1C 6.7 (A) 09/17/2024   HGBA1C 8.1 (A) 06/16/2024       Latest Ref Rng & Units 12/16/2024   12:01 PM 09/17/2024    2:45 PM 02/04/2024   10:37 AM  BMP  Glucose 70 - 99 mg/dL 816  897  95   BUN 8 - 23 mg/dL 20  14  10    Creatinine 0.61 - 1.24 mg/dL 8.60  8.96  9.07   BUN/Creat Ratio 10 - 24  14  11    Sodium 135 - 145 mmol/L 138  138  143   Potassium 3.5 - 5.1 mmol/L 4.8  4.6  3.6   Chloride 98 - 111 mmol/L 99  103  103   CO2 22 - 32 mmol/L 28  22  25    Calcium  8.9 - 10.3 mg/dL 9.4  9.1  9.1     Lab Results  Component Value Date   CHOL 115 12/16/2024   HDL 33 (L) 12/16/2024   LDLCALC 43 12/16/2024   TRIG 194 (H) 12/16/2024   CHOLHDL 3.5 12/16/2024    Medications Reviewed Today   Medications were not reviewed in this encounter       Assessment/Plan:   Diabetes: - Currently uncontrolled with A1C today of 7.8% above goal <7%, however, did not have CGM available to review recent controlled. Identified increased soda intake as reason for  worsened control. Suspect lack of adherence to Ozempic  and Humalog  as well given dispense hx and 6 month PAP supply that was ready for pick up at [facility]. Luckily, patient will be re-enrolling with [name]/Paramedicine. There is room for increasing his doses of metformin  and/or Jardiance  if additional pharmacotherapy is needed in the future.  - Last UACR 49 mg/g 02/04/24 - Reviewed long term cardiovascular and renal outcomes of uncontrolled blood sugar - Reviewed goal A1c, goal fasting, and goal 2 hour post prandial glucose - Reviewed hypoglycemia management plan and the rule of 15 - Reviewed dietary modifications including  utilizing the healthy plate method, limiting portion size of carbohydrate foods, increasing intake of protein and non-starchy vegetables. Counseled patient to stay hydrated with water throughout the day. - Recommend to continue Ozempic  2 mg weekly, Jardiance  10 mg daily, Humalog  75/25 Mix 30 units BID with meals.  - Provided patient with refills for maintenance medications and CGM. Educated to bring his reader device to all  his appointments.  - Will collaborate with CPhT to submit LillyCares application for Humalog  mix 75/25 given ongoing cost concerns. Obtained signature today. - Next A1C due 03/18/25   Heart Failure: - Currently appropriately managed - Reviewed appropriate blood pressure monitoring technique and reviewed goal blood pressure - Reviewed to weigh daily and when to contact cardiology with weight gain - Recommend to continue current regimen  Current medications:  ACEi/ARB/ARNI: Entresto  49-51 mg BID - will be starting tomorrow Deniece is delivering meds today) SGLT2i: Jardiance  10 mg daily Beta blocker: metoprolol  succinate 75 mg (1.5x 50 mg tabs) daily Mineralocorticoid Receptor Antagonist: spironolactone  25 mg daily Diuretic regimen: furosemide  40 mg BID + potassium 20 mEq BID   Tobacco Abuse - Currently uncontrolled, but improved. - Provided  motivational interviewing to assess tobacco use and strategies for reduction - Provided information on 1 800 QUIT NOW support program - Recommend to continue varenicline  1 mg twice daily. Counseled to take with food to reduce risk of GI adverse effects. Also counseled to stop therapy and contact provider if change in mood.  - Continue use of nicotine  patch once daily (Q24h). Discussed proper technique for using nicotine  lozenge. Advised to use every 1-2 hours as needed for cravings.   Patient verbalized understanding of treatment plan. Provided written instructions. Provided with phone number for ENT as requested.   Follow Up Plan:  Pharmacist in person 01/28/25 PCP clinic visit 03/18/25   Lorain Baseman, PharmD [facility] [phone]   "

## 2024-12-18 ENCOUNTER — Other Ambulatory Visit: Payer: Self-pay | Admitting: Nurse Practitioner

## 2024-12-18 DIAGNOSIS — F32A Depression, unspecified: Secondary | ICD-10-CM

## 2024-12-19 ENCOUNTER — Other Ambulatory Visit: Payer: Self-pay | Admitting: Nurse Practitioner

## 2024-12-19 DIAGNOSIS — G6289 Other specified polyneuropathies: Secondary | ICD-10-CM

## 2024-12-19 DIAGNOSIS — E1165 Type 2 diabetes mellitus with hyperglycemia: Secondary | ICD-10-CM

## 2024-12-19 NOTE — Telephone Encounter (Signed)
 gabapentin  (NEURONTIN ) 800 MG tablet [Pharmacy Med Name: gabapentin  800 mg tablet]

## 2024-12-19 NOTE — Telephone Encounter (Signed)
 Please advise [facility]

## 2024-12-24 ENCOUNTER — Telehealth (HOSPITAL_COMMUNITY): Payer: Self-pay

## 2024-12-24 NOTE — Telephone Encounter (Signed)
 Called [name] and he asked to reschedule for paramedicine visit for Monday next week. I plan to see him then.   [name], EMT-Paramedic [phone] 12/24/2024

## 2024-12-26 ENCOUNTER — Ambulatory Visit (HOSPITAL_COMMUNITY)

## 2025-01-01 ENCOUNTER — Telehealth (HOSPITAL_COMMUNITY): Payer: Self-pay

## 2025-01-01 ENCOUNTER — Other Ambulatory Visit: Payer: Self-pay | Admitting: Nurse Practitioner

## 2025-01-01 ENCOUNTER — Ambulatory Visit (HOSPITAL_COMMUNITY)

## 2025-01-01 DIAGNOSIS — G6289 Other specified polyneuropathies: Secondary | ICD-10-CM

## 2025-01-01 DIAGNOSIS — E1165 Type 2 diabetes mellitus with hyperglycemia: Secondary | ICD-10-CM

## 2025-01-01 NOTE — Telephone Encounter (Signed)
 Spoke to Advance who asked to be seen next week because he has a cold. I offered to still come out and he refused. We agreed to visit next Tuesday. Call complete.   [name], EMT-Paramedic [phone] 01/01/2025

## 2025-01-01 NOTE — Telephone Encounter (Signed)
 pantoprazole  (PROTONIX ) 40 MG tablet [Pharmacy Med Name: pantoprazole  40 mg tablet,delayed release]      gabapentin  (NEURONTIN ) 800 MG tablet [Pharmacy Med Name: gabapentin  800 mg tablet]

## 2025-01-01 NOTE — Telephone Encounter (Signed)
 Please advise [facility]

## 2025-01-08 ENCOUNTER — Ambulatory Visit (HOSPITAL_COMMUNITY)

## 2025-01-21 ENCOUNTER — Institutional Professional Consult (permissible substitution) (INDEPENDENT_AMBULATORY_CARE_PROVIDER_SITE_OTHER): Admitting: Otolaryngology

## 2025-01-28 ENCOUNTER — Ambulatory Visit: Payer: Self-pay

## 2025-03-06 ENCOUNTER — Ambulatory Visit

## 2025-03-18 ENCOUNTER — Ambulatory Visit: Payer: Self-pay | Admitting: Nurse Practitioner

## 2025-04-15 ENCOUNTER — Other Ambulatory Visit (HOSPITAL_COMMUNITY)

## 2025-04-15 ENCOUNTER — Ambulatory Visit (HOSPITAL_COMMUNITY)

## 2025-06-05 ENCOUNTER — Ambulatory Visit
